# Patient Record
Sex: Male | Born: 1960 | Race: Black or African American | Hispanic: No | Marital: Single | Smoking: Former smoker
Health system: Southern US, Community
[De-identification: ages and names within clinical notes are randomized; demographics above are authoritative.]

## PROBLEM LIST (undated history)

## (undated) DIAGNOSIS — Z86718 Personal history of other venous thrombosis and embolism: Secondary | ICD-10-CM

## (undated) DIAGNOSIS — Z8719 Personal history of other diseases of the digestive system: Secondary | ICD-10-CM

## (undated) DIAGNOSIS — I739 Peripheral vascular disease, unspecified: Secondary | ICD-10-CM

## (undated) DIAGNOSIS — K219 Gastro-esophageal reflux disease without esophagitis: Secondary | ICD-10-CM

## (undated) DIAGNOSIS — I5032 Chronic diastolic (congestive) heart failure: Secondary | ICD-10-CM

## (undated) DIAGNOSIS — B1920 Unspecified viral hepatitis C without hepatic coma: Secondary | ICD-10-CM

## (undated) DIAGNOSIS — C61 Malignant neoplasm of prostate: Secondary | ICD-10-CM

## (undated) DIAGNOSIS — I2510 Atherosclerotic heart disease of native coronary artery without angina pectoris: Secondary | ICD-10-CM

## (undated) DIAGNOSIS — I5022 Chronic systolic (congestive) heart failure: Secondary | ICD-10-CM

## (undated) DIAGNOSIS — IMO0002 Reserved for concepts with insufficient information to code with codable children: Secondary | ICD-10-CM

## (undated) DIAGNOSIS — Z940 Kidney transplant status: Secondary | ICD-10-CM

## (undated) DIAGNOSIS — M1990 Unspecified osteoarthritis, unspecified site: Secondary | ICD-10-CM

## (undated) DIAGNOSIS — N189 Chronic kidney disease, unspecified: Secondary | ICD-10-CM

## (undated) DIAGNOSIS — E46 Unspecified protein-calorie malnutrition: Secondary | ICD-10-CM

## (undated) DIAGNOSIS — Z992 Dependence on renal dialysis: Secondary | ICD-10-CM

## (undated) DIAGNOSIS — I96 Gangrene, not elsewhere classified: Secondary | ICD-10-CM

## (undated) DIAGNOSIS — N186 End stage renal disease: Secondary | ICD-10-CM

## (undated) DIAGNOSIS — M1A9XX1 Chronic gout, unspecified, with tophus (tophi): Secondary | ICD-10-CM

## (undated) DIAGNOSIS — I10 Essential (primary) hypertension: Secondary | ICD-10-CM

## (undated) DIAGNOSIS — D631 Anemia in chronic kidney disease: Secondary | ICD-10-CM

## (undated) HISTORY — PX: OTHER SURGICAL HISTORY: SHX169

## (undated) HISTORY — PX: EYE SURGERY: SHX253

## (undated) HISTORY — PX: COLONOSCOPY: SHX174

## (undated) HISTORY — DX: Peripheral vascular disease, unspecified: I73.9

---

## 1998-01-03 ENCOUNTER — Ambulatory Visit (HOSPITAL_COMMUNITY): Admission: RE | Admit: 1998-01-03 | Discharge: 1998-01-03 | Payer: Self-pay | Admitting: Nephrology

## 1998-01-17 ENCOUNTER — Ambulatory Visit (HOSPITAL_COMMUNITY): Admission: RE | Admit: 1998-01-17 | Discharge: 1998-01-17 | Payer: Self-pay | Admitting: Nephrology

## 1998-01-30 ENCOUNTER — Ambulatory Visit (HOSPITAL_COMMUNITY): Admission: RE | Admit: 1998-01-30 | Discharge: 1998-01-30 | Payer: Self-pay | Admitting: Nephrology

## 2000-05-02 ENCOUNTER — Emergency Department (HOSPITAL_COMMUNITY): Admission: EM | Admit: 2000-05-02 | Discharge: 2000-05-02 | Payer: Self-pay | Admitting: Emergency Medicine

## 2000-08-25 ENCOUNTER — Encounter (HOSPITAL_COMMUNITY): Admission: RE | Admit: 2000-08-25 | Discharge: 2000-11-23 | Payer: Self-pay | Admitting: Nephrology

## 2000-09-06 ENCOUNTER — Encounter (INDEPENDENT_AMBULATORY_CARE_PROVIDER_SITE_OTHER): Payer: Self-pay | Admitting: *Deleted

## 2000-09-06 ENCOUNTER — Inpatient Hospital Stay (HOSPITAL_COMMUNITY): Admission: EM | Admit: 2000-09-06 | Discharge: 2000-09-14 | Payer: Self-pay | Admitting: Emergency Medicine

## 2000-09-08 ENCOUNTER — Encounter: Payer: Self-pay | Admitting: *Deleted

## 2000-10-06 ENCOUNTER — Encounter (INDEPENDENT_AMBULATORY_CARE_PROVIDER_SITE_OTHER): Payer: Self-pay | Admitting: *Deleted

## 2000-10-06 ENCOUNTER — Ambulatory Visit (HOSPITAL_COMMUNITY): Admission: RE | Admit: 2000-10-06 | Discharge: 2000-10-06 | Payer: Self-pay | Admitting: Family Medicine

## 2001-01-05 ENCOUNTER — Encounter (HOSPITAL_COMMUNITY): Admission: RE | Admit: 2001-01-05 | Discharge: 2001-04-05 | Payer: Self-pay | Admitting: Nephrology

## 2001-01-25 ENCOUNTER — Ambulatory Visit (HOSPITAL_COMMUNITY): Admission: RE | Admit: 2001-01-25 | Discharge: 2001-01-25 | Payer: Self-pay | Admitting: *Deleted

## 2001-01-25 ENCOUNTER — Encounter (INDEPENDENT_AMBULATORY_CARE_PROVIDER_SITE_OTHER): Payer: Self-pay | Admitting: Specialist

## 2001-02-04 ENCOUNTER — Encounter: Payer: Self-pay | Admitting: Nephrology

## 2001-02-04 ENCOUNTER — Encounter: Admission: RE | Admit: 2001-02-04 | Discharge: 2001-02-04 | Payer: Self-pay | Admitting: Nephrology

## 2001-02-09 ENCOUNTER — Ambulatory Visit (HOSPITAL_BASED_OUTPATIENT_CLINIC_OR_DEPARTMENT_OTHER): Admission: RE | Admit: 2001-02-09 | Discharge: 2001-02-09 | Payer: Self-pay | Admitting: Orthopedic Surgery

## 2001-04-13 ENCOUNTER — Encounter (HOSPITAL_COMMUNITY): Admission: RE | Admit: 2001-04-13 | Discharge: 2001-07-12 | Payer: Self-pay | Admitting: Nephrology

## 2001-09-20 ENCOUNTER — Encounter (HOSPITAL_COMMUNITY): Admission: RE | Admit: 2001-09-20 | Discharge: 2001-12-19 | Payer: Self-pay | Admitting: Nephrology

## 2002-01-18 ENCOUNTER — Encounter: Payer: Self-pay | Admitting: General Surgery

## 2002-01-20 ENCOUNTER — Encounter (INDEPENDENT_AMBULATORY_CARE_PROVIDER_SITE_OTHER): Payer: Self-pay | Admitting: *Deleted

## 2002-01-20 ENCOUNTER — Ambulatory Visit (HOSPITAL_COMMUNITY): Admission: RE | Admit: 2002-01-20 | Discharge: 2002-01-21 | Payer: Self-pay | Admitting: General Surgery

## 2002-03-14 ENCOUNTER — Encounter (HOSPITAL_COMMUNITY): Admission: RE | Admit: 2002-03-14 | Discharge: 2002-06-12 | Payer: Self-pay | Admitting: Nephrology

## 2002-06-13 ENCOUNTER — Encounter (HOSPITAL_COMMUNITY): Admission: RE | Admit: 2002-06-13 | Discharge: 2002-09-11 | Payer: Self-pay | Admitting: Nephrology

## 2002-09-19 ENCOUNTER — Encounter (HOSPITAL_COMMUNITY): Admission: RE | Admit: 2002-09-19 | Discharge: 2002-12-18 | Payer: Self-pay | Admitting: Nephrology

## 2003-01-09 ENCOUNTER — Encounter (HOSPITAL_COMMUNITY): Admission: RE | Admit: 2003-01-09 | Discharge: 2003-04-09 | Payer: Self-pay | Admitting: Nephrology

## 2003-01-27 ENCOUNTER — Ambulatory Visit (HOSPITAL_COMMUNITY): Admission: RE | Admit: 2003-01-27 | Discharge: 2003-01-27 | Payer: Self-pay | Admitting: Orthopedic Surgery

## 2003-01-27 ENCOUNTER — Encounter: Payer: Self-pay | Admitting: Orthopedic Surgery

## 2003-04-20 ENCOUNTER — Encounter: Admission: RE | Admit: 2003-04-20 | Discharge: 2003-07-19 | Payer: Self-pay | Admitting: Rheumatology

## 2003-04-24 ENCOUNTER — Encounter (HOSPITAL_COMMUNITY): Admission: RE | Admit: 2003-04-24 | Discharge: 2003-07-23 | Payer: Self-pay | Admitting: Nephrology

## 2003-04-26 ENCOUNTER — Encounter: Payer: Self-pay | Admitting: Vascular Surgery

## 2003-04-26 ENCOUNTER — Ambulatory Visit (HOSPITAL_COMMUNITY): Admission: RE | Admit: 2003-04-26 | Discharge: 2003-04-26 | Payer: Self-pay | Admitting: Vascular Surgery

## 2003-05-06 ENCOUNTER — Encounter (INDEPENDENT_AMBULATORY_CARE_PROVIDER_SITE_OTHER): Payer: Self-pay | Admitting: *Deleted

## 2003-05-06 ENCOUNTER — Encounter: Payer: Self-pay | Admitting: Nephrology

## 2003-05-06 ENCOUNTER — Inpatient Hospital Stay (HOSPITAL_COMMUNITY): Admission: EM | Admit: 2003-05-06 | Discharge: 2003-05-19 | Payer: Self-pay | Admitting: Emergency Medicine

## 2003-05-06 ENCOUNTER — Encounter: Payer: Self-pay | Admitting: Emergency Medicine

## 2003-05-08 ENCOUNTER — Encounter: Payer: Self-pay | Admitting: Nephrology

## 2003-05-10 ENCOUNTER — Encounter: Payer: Self-pay | Admitting: Nephrology

## 2003-05-13 ENCOUNTER — Encounter: Payer: Self-pay | Admitting: Nephrology

## 2003-05-14 ENCOUNTER — Encounter: Payer: Self-pay | Admitting: Nephrology

## 2003-05-15 ENCOUNTER — Encounter: Payer: Self-pay | Admitting: Nephrology

## 2003-05-17 ENCOUNTER — Encounter: Payer: Self-pay | Admitting: General Surgery

## 2003-09-27 ENCOUNTER — Encounter: Admission: RE | Admit: 2003-09-27 | Discharge: 2003-09-27 | Payer: Self-pay | Admitting: General Surgery

## 2003-09-29 ENCOUNTER — Ambulatory Visit (HOSPITAL_COMMUNITY): Admission: RE | Admit: 2003-09-29 | Discharge: 2003-09-29 | Payer: Self-pay | Admitting: Nephrology

## 2003-11-06 ENCOUNTER — Encounter (INDEPENDENT_AMBULATORY_CARE_PROVIDER_SITE_OTHER): Payer: Self-pay | Admitting: Specialist

## 2003-11-06 ENCOUNTER — Inpatient Hospital Stay (HOSPITAL_COMMUNITY): Admission: RE | Admit: 2003-11-06 | Discharge: 2003-11-12 | Payer: Self-pay | Admitting: General Surgery

## 2003-11-28 ENCOUNTER — Ambulatory Visit (HOSPITAL_BASED_OUTPATIENT_CLINIC_OR_DEPARTMENT_OTHER): Admission: RE | Admit: 2003-11-28 | Discharge: 2003-11-28 | Payer: Self-pay | Admitting: Orthopedic Surgery

## 2003-11-28 ENCOUNTER — Ambulatory Visit (HOSPITAL_COMMUNITY): Admission: RE | Admit: 2003-11-28 | Discharge: 2003-11-28 | Payer: Self-pay | Admitting: Orthopedic Surgery

## 2003-11-28 ENCOUNTER — Encounter (INDEPENDENT_AMBULATORY_CARE_PROVIDER_SITE_OTHER): Payer: Self-pay | Admitting: *Deleted

## 2003-12-13 ENCOUNTER — Emergency Department (HOSPITAL_COMMUNITY): Admission: EM | Admit: 2003-12-13 | Discharge: 2003-12-13 | Payer: Self-pay | Admitting: Family Medicine

## 2003-12-27 ENCOUNTER — Encounter: Admission: RE | Admit: 2003-12-27 | Discharge: 2003-12-27 | Payer: Self-pay | Admitting: Nephrology

## 2004-01-06 ENCOUNTER — Inpatient Hospital Stay (HOSPITAL_COMMUNITY): Admission: EM | Admit: 2004-01-06 | Discharge: 2004-01-17 | Payer: Self-pay | Admitting: Emergency Medicine

## 2004-01-23 ENCOUNTER — Ambulatory Visit (HOSPITAL_COMMUNITY): Admission: RE | Admit: 2004-01-23 | Discharge: 2004-01-23 | Payer: Self-pay | Admitting: Nephrology

## 2004-01-31 ENCOUNTER — Encounter: Admission: RE | Admit: 2004-01-31 | Discharge: 2004-01-31 | Payer: Self-pay | Admitting: Nephrology

## 2004-09-04 ENCOUNTER — Encounter (INDEPENDENT_AMBULATORY_CARE_PROVIDER_SITE_OTHER): Payer: Self-pay | Admitting: Specialist

## 2004-09-04 ENCOUNTER — Ambulatory Visit (HOSPITAL_COMMUNITY): Admission: RE | Admit: 2004-09-04 | Discharge: 2004-09-04 | Payer: Self-pay | Admitting: Surgery

## 2004-10-31 ENCOUNTER — Emergency Department (HOSPITAL_COMMUNITY): Admission: EM | Admit: 2004-10-31 | Discharge: 2004-10-31 | Payer: Self-pay | Admitting: *Deleted

## 2004-11-01 ENCOUNTER — Inpatient Hospital Stay (HOSPITAL_COMMUNITY): Admission: EM | Admit: 2004-11-01 | Discharge: 2004-11-06 | Payer: Self-pay | Admitting: Emergency Medicine

## 2004-11-21 ENCOUNTER — Ambulatory Visit (HOSPITAL_COMMUNITY): Admission: RE | Admit: 2004-11-21 | Discharge: 2004-11-22 | Payer: Self-pay | Admitting: *Deleted

## 2005-03-15 ENCOUNTER — Emergency Department (HOSPITAL_COMMUNITY): Admission: EM | Admit: 2005-03-15 | Discharge: 2005-03-15 | Payer: Self-pay | Admitting: Emergency Medicine

## 2005-03-26 ENCOUNTER — Ambulatory Visit: Payer: Self-pay | Admitting: Orthopedic Surgery

## 2005-04-23 ENCOUNTER — Ambulatory Visit: Payer: Self-pay | Admitting: Orthopedic Surgery

## 2006-03-27 ENCOUNTER — Encounter: Admission: RE | Admit: 2006-03-27 | Discharge: 2006-03-27 | Payer: Self-pay | Admitting: Nephrology

## 2006-07-17 ENCOUNTER — Ambulatory Visit (HOSPITAL_COMMUNITY): Admission: RE | Admit: 2006-07-17 | Discharge: 2006-07-17 | Payer: Self-pay | Admitting: Nephrology

## 2007-01-13 ENCOUNTER — Ambulatory Visit (HOSPITAL_COMMUNITY): Admission: RE | Admit: 2007-01-13 | Discharge: 2007-01-13 | Payer: Self-pay | Admitting: Nephrology

## 2007-07-05 ENCOUNTER — Ambulatory Visit (HOSPITAL_COMMUNITY): Admission: RE | Admit: 2007-07-05 | Discharge: 2007-07-05 | Payer: Self-pay | Admitting: Nephrology

## 2007-11-04 ENCOUNTER — Emergency Department (HOSPITAL_COMMUNITY): Admission: EM | Admit: 2007-11-04 | Discharge: 2007-11-04 | Payer: Self-pay | Admitting: Emergency Medicine

## 2008-11-08 ENCOUNTER — Ambulatory Visit (HOSPITAL_COMMUNITY): Admission: RE | Admit: 2008-11-08 | Discharge: 2008-11-08 | Payer: Self-pay | Admitting: Nephrology

## 2009-03-10 ENCOUNTER — Ambulatory Visit (HOSPITAL_COMMUNITY): Admission: RE | Admit: 2009-03-10 | Discharge: 2009-03-10 | Payer: Self-pay | Admitting: Vascular Surgery

## 2009-03-10 ENCOUNTER — Ambulatory Visit: Payer: Self-pay | Admitting: Vascular Surgery

## 2009-03-12 ENCOUNTER — Ambulatory Visit: Payer: Self-pay | Admitting: Surgery

## 2009-03-23 ENCOUNTER — Ambulatory Visit (HOSPITAL_COMMUNITY): Admission: RE | Admit: 2009-03-23 | Discharge: 2009-03-23 | Payer: Self-pay | Admitting: Surgery

## 2009-04-16 ENCOUNTER — Ambulatory Visit: Payer: Self-pay | Admitting: Surgery

## 2009-04-27 ENCOUNTER — Ambulatory Visit (HOSPITAL_COMMUNITY): Admission: RE | Admit: 2009-04-27 | Discharge: 2009-04-27 | Payer: Self-pay | Admitting: Vascular Surgery

## 2009-08-25 ENCOUNTER — Ambulatory Visit (HOSPITAL_COMMUNITY): Admission: RE | Admit: 2009-08-25 | Discharge: 2009-08-25 | Payer: Self-pay | Admitting: Nephrology

## 2009-09-22 HISTORY — PX: OTHER SURGICAL HISTORY: SHX169

## 2009-12-12 ENCOUNTER — Ambulatory Visit (HOSPITAL_COMMUNITY): Admission: RE | Admit: 2009-12-12 | Discharge: 2009-12-12 | Payer: Self-pay | Admitting: Nephrology

## 2010-02-25 ENCOUNTER — Encounter (INDEPENDENT_AMBULATORY_CARE_PROVIDER_SITE_OTHER): Payer: Self-pay | Admitting: *Deleted

## 2010-02-27 ENCOUNTER — Ambulatory Visit: Payer: Self-pay | Admitting: Gastroenterology

## 2010-03-10 ENCOUNTER — Emergency Department (HOSPITAL_COMMUNITY): Admission: EM | Admit: 2010-03-10 | Discharge: 2010-03-10 | Payer: Self-pay | Admitting: Emergency Medicine

## 2010-03-12 ENCOUNTER — Ambulatory Visit (HOSPITAL_COMMUNITY): Admission: RE | Admit: 2010-03-12 | Discharge: 2010-03-12 | Payer: Self-pay | Admitting: Nephrology

## 2010-03-13 ENCOUNTER — Ambulatory Visit: Payer: Self-pay | Admitting: Gastroenterology

## 2010-06-04 ENCOUNTER — Ambulatory Visit (HOSPITAL_COMMUNITY): Admission: RE | Admit: 2010-06-04 | Discharge: 2010-06-04 | Payer: Self-pay | Admitting: Nephrology

## 2010-08-02 ENCOUNTER — Ambulatory Visit (HOSPITAL_COMMUNITY): Admission: RE | Admit: 2010-08-02 | Discharge: 2010-08-02 | Payer: Self-pay | Admitting: Nephrology

## 2010-08-16 ENCOUNTER — Encounter: Admission: RE | Admit: 2010-08-16 | Discharge: 2010-08-16 | Payer: Self-pay | Admitting: Nephrology

## 2010-09-28 ENCOUNTER — Ambulatory Visit (HOSPITAL_COMMUNITY)
Admission: RE | Admit: 2010-09-28 | Discharge: 2010-09-28 | Payer: Self-pay | Source: Home / Self Care | Attending: Nephrology | Admitting: Nephrology

## 2010-09-30 ENCOUNTER — Ambulatory Visit (HOSPITAL_COMMUNITY)
Admission: RE | Admit: 2010-09-30 | Discharge: 2010-09-30 | Payer: Self-pay | Source: Home / Self Care | Attending: Vascular Surgery | Admitting: Vascular Surgery

## 2010-10-07 LAB — POCT I-STAT 4, (NA,K, GLUC, HGB,HCT)
Glucose, Bld: 79 mg/dL (ref 70–99)
HCT: 40 % (ref 39.0–52.0)
Hemoglobin: 13.6 g/dL (ref 13.0–17.0)
Potassium: 5.9 mEq/L — ABNORMAL HIGH (ref 3.5–5.1)
Sodium: 139 mEq/L (ref 135–145)

## 2010-10-07 LAB — POTASSIUM: Potassium: 4.9 mEq/L (ref 3.5–5.1)

## 2010-10-07 LAB — SURGICAL PCR SCREEN
MRSA, PCR: NEGATIVE
Staphylococcus aureus: NEGATIVE

## 2010-10-13 ENCOUNTER — Encounter: Payer: Self-pay | Admitting: Diagnostic Radiology

## 2010-10-22 NOTE — Letter (Signed)
Summary: Moviprep Instructions  [facility]  [address]   [place], [place] [address]   Phone: [phone]  Fax: [phone]       [name]    12-11-1960    MRN: [id number]        Procedure Day Dorna Bloom: Wednesday, 03-13-10     Arrival Time: 12:30 p.m.     Procedure Time: 1:30 p.m.     Location of Procedure:                    x   [facility] (4th Floor)                        PREPARATION FOR COLONOSCOPY WITH MOVIPREP   Starting 5 days prior to your procedure  03-08-10 do not eat nuts, seeds, popcorn, corn, beans, peas,  salads, or any raw vegetables.  Do not take any fiber supplements (e.g. Metamucil, Citrucel, and Benefiber).  THE DAY BEFORE YOUR PROCEDURE         DATE:  03-12-10  DAY: Tuesday  1.  Drink clear liquids the entire day-NO SOLID FOOD  2.  Do not drink anything colored red or purple.  Avoid juices with pulp.  No orange juice.  3.  Drink at least 64 oz. (8 glasses) of fluid/clear liquids during the day to prevent dehydration and help the prep work efficiently.  CLEAR LIQUIDS INCLUDE: Water Jello Ice Popsicles Tea (sugar ok, no milk/cream) Powdered fruit flavored drinks Coffee (sugar ok, no milk/cream) Gatorade Juice: apple, white grape, white cranberry  Lemonade Clear bullion, consomm, broth Carbonated beverages (any kind) Strained chicken noodle soup Hard Candy                             4.  In the morning, mix first dose of MoviPrep solution:    Empty 1 Pouch A and 1 Pouch B into the disposable container    Add lukewarm drinking water to the top line of the container. Mix to dissolve    Refrigerate (mixed solution should be used within 24 hrs)  5.  Begin drinking the prep at 5:00 p.m. The MoviPrep container is divided by 4 marks.   Every 15 minutes drink the solution down to the next mark (approximately 8 oz) until the full liter is complete.   6.  Follow completed prep with 16 oz of clear liquid of your choice  (Nothing red or purple).  Continue to drink clear liquids until bedtime.  7.  Before going to bed, mix second dose of MoviPrep solution:    Empty 1 Pouch A and 1 Pouch B into the disposable container    Add lukewarm drinking water to the top line of the container. Mix to dissolve    Refrigerate  THE DAY OF YOUR PROCEDURE      DATE: 03-13-10  DAY: Wednesday  Beginning at 8:30 a.m. (5 hours before procedure):         1. Every 15 minutes, drink the solution down to the next mark (approx 8 oz) until the full liter is complete.  2. Follow completed prep with 16 oz. of clear liquid of your choice.    3. You may drink clear liquids until  11:30 a.m.  (2 HOURS BEFORE PROCEDURE).   MEDICATION INSTRUCTIONS  Unless otherwise instructed, you should take regular prescription medications with a small sip of water   as early  as possible the morning of your procedure.           OTHER INSTRUCTIONS  You will need a responsible adult at least 52 years of age to accompany you and drive you home.   This person must remain in the waiting room during your procedure.  Wear loose fitting clothing that is easily removed.  Leave jewelry and other valuables at home.  However, you may wish to bring a book to read or  an iPod/MP3 player to listen to music as you wait for your procedure to start.  Remove all body piercing jewelry and leave at home.  Total time from sign-in until discharge is approximately 2-3 hours.  You should go home directly after your procedure and rest.  You can resume normal activities the  day after your procedure.  The day of your procedure you should not:   Drive   Make legal decisions   Operate machinery   Drink alcohol   Return to work  You will receive specific instructions about eating, activities and medications before you leave.    The above instructions have been reviewed and explained to me by Ezra Sites RN  February 27, 2010 11:34 AM       I fully  understand and can verbalize these instructions _____________________________ Date _________

## 2010-10-22 NOTE — Procedures (Signed)
Summary: Colonoscopy  Patient: [name] Note: All result statuses are Final unless otherwise noted.  Tests: (1) Colonoscopy (COL)   COL Colonoscopy           DONE     [facility]     [address]     [place], [place]  [address]           COLONOSCOPY PROCEDURE REPORT           PATIENT:  [name], [name]  MR#:  [id number]     BIRTHDATE:  13-May-1961, 48 yrs. old  GENDER:  male     ENDOSCOPIST:  Judie Petit T. Russella Dar, MD, [facility]     Referred by:  Primitivo Gauze, M.D.     PROCEDURE DATE:  03/13/2010     PROCEDURE:  Average-risk screening colonoscopy G0121     ASA CLASS:  Class III     INDICATIONS:  1) Routine Risk Screening     MEDICATIONS:   Fentanyl 100 mcg IV, Versed 10 mg IV     DESCRIPTION OF PROCEDURE:   After the risks benefits and     alternatives of the procedure were thoroughly explained, informed     consent was obtained.  Digital rectal exam was performed and     revealed no abnormalities.   The LB PCF-Q180AL [id number] endoscope     was introduced through the anus and advanced to the cecum, which     was identified by both the appendix and ileocecal valve, without     limitations.  The quality of the prep was excellent, using     MoviPrep.  The instrument was then slowly withdrawn as the colon     was fully examined.     <<PROCEDUREIMAGES>>     FINDINGS:  Moderate diverticulosis was found ascending colon to     sigmoid colon. This was otherwise a normal examination of the     colon.   Retroflexed views in the rectum revealed no     abnormalities.  The time to cecum =  2.25  minutes. The scope was     then withdrawn (time =  8.67  min) from the patient and the     procedure completed.     COMPLICATIONS:  None           ENDOSCOPIC IMPRESSION:     1) Moderate diverticulosis ascending colon to sigmoid colon     RECOMMENDATIONS:     1) High fiber diet.     2) Continue current colorectal screening recommendations for     "routine risk" patients with a repeat  colonoscopy in 10 years.           Venita Lick. Russella Dar, MD, [facility]           n.     eSIGNED:   Venita Lick. Kelisha Dall at 03/13/2010 02:02 PM           Duard Larsen, [id number]  Note: An exclamation mark (!) indicates a result that was not dispersed into the flowsheet. Document Creation Date: 03/13/2010 2:02 PM _______________________________________________________________________  (1) Order result status: Final Collection or observation date-time: 03/13/2010 13:57 Requested date-time:  Receipt date-time:  Reported date-time:  Referring Physician:   Ordering Physician: Claudette Head [phone]) Specimen Source:  Source: Launa Grill Order Number: [id number] Lab site:   Appended Document: Colonoscopy     Procedures Next Due Date:    Colonoscopy: 03/2020

## 2010-10-22 NOTE — Miscellaneous (Signed)
Summary: [facility]  Clinical Lists Changes  Medications: Added new medication of MOVIPREP 100 GM  SOLR (PEG-KCL-NACL-NASULF-NA ASC-C) As per prep instructions. - Signed Rx of MOVIPREP 100 GM  SOLR (PEG-KCL-NACL-NASULF-NA ASC-C) As per prep instructions.;  #1 x 0;  Signed;  Entered by: Ezra Sites RN;  Authorized by: Meryl Dare MD [facility];  Method used: Electronically to [facility] [address], [place], [place], [place]  [address], Ph: [phone] or [phone], Fax: [phone] Allergies: Added new allergy or adverse reaction of ALLOPURINOL Added new allergy or adverse reaction of ASA Observations: Added new observation of NKA: F (02/27/2010 11:04)    Prescriptions: MOVIPREP 100 GM  SOLR (PEG-KCL-NACL-NASULF-NA ASC-C) As per prep instructions.  #1 x 0   Entered by:   Ezra Sites RN   Authorized by:   Meryl Dare MD [facility]   Signed by:   Ezra Sites RN on 02/27/2010   Method used:   Electronically to        [facility]* (retail)       [address]       [place], [place]  [address]       Ph: [phone] or [phone]       Fax: [phone]   RxID:   [id number]

## 2010-11-08 ENCOUNTER — Ambulatory Visit
Admission: RE | Admit: 2010-11-08 | Discharge: 2010-11-08 | Disposition: A | Discharge status: Home / Self Care | Payer: Medicare Other | Source: Ambulatory Visit | Attending: Nephrology | Admitting: Nephrology

## 2010-11-08 ENCOUNTER — Other Ambulatory Visit: Payer: Self-pay | Admitting: Nephrology

## 2010-11-08 DIAGNOSIS — M25531 Pain in right wrist: Secondary | ICD-10-CM

## 2010-12-03 LAB — POTASSIUM: Potassium: 4.3 mEq/L (ref 3.5–5.1)

## 2010-12-08 LAB — POTASSIUM: Potassium: 5.6 mEq/L — ABNORMAL HIGH (ref 3.5–5.1)

## 2010-12-16 LAB — POTASSIUM: Potassium: 4.5 mEq/L (ref 3.5–5.1)

## 2010-12-29 LAB — POCT I-STAT 4, (NA,K, GLUC, HGB,HCT): HCT: 45 % (ref 39.0–52.0)

## 2010-12-30 LAB — POCT I-STAT 4, (NA,K, GLUC, HGB,HCT)
Glucose, Bld: 82 mg/dL (ref 70–99)
Hemoglobin: 15.6 g/dL (ref 13.0–17.0)
Potassium: 4.3 mEq/L (ref 3.5–5.1)

## 2011-02-04 NOTE — Assessment & Plan Note (Signed)
OFFICE VISIT   JAHEIM, CANINO  DOB:  02-17-1961                                       03/12/2009  [id number]#:[id number]   REASON FOR VISIT:  Dialysis access.   HISTORY:  The patient is a 50 year old gentleman with end-stage renal  disease who has previously been dialyzing through a left upper arm  fistula.  This is no longer usable.  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  He recently had a Palindrome catheter placed.  He  comes in for new access.  Vein mapping was performed today which shows  him to have an occluded cephalic vein above the elbow.  His basilic vein  is too short for basilic vein transposition.  He, therefore, is a  candidate for a right forearm AV Gore-Tex graft.  The patient has a  prominent aspect on his left fistula and so I think he also needs to  undergo ligation of the fistula.  I am scheduling him for a right  forearm AV Gore-Tex graft and ligation of his left upper arm fistula to  be performed on Friday, July 2nd.   Jorge Ny, MD  Electronically Signed   VWB/MEDQ  D:  03/12/2009  T:  03/13/2009  Job:  [id number]   cc:   [facility]

## 2011-02-04 NOTE — Assessment & Plan Note (Signed)
OFFICE VISIT   ALEX, LEAHY  DOB:  04-01-1961                                       04/16/2009  [id number]#:[id number]   REASON FOR VISIT:  Follow up access.   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  He has  recently had a right-sided Palindrome catheter and came in for new  access.  Vein mapping was performed which showed him to have an occluded  cephalic vein above the elbow on the right and a basilic vein that was  too short for transposition.  He was, therefore, only a candidate for a  right-sided graft.  He also still had some pulsatility his left arm  fistula and therefore he underwent right forearm AV Gore-Tex graft and  ligation of his left arm fistula.  He comes back in today for follow-up.   There is good thrill within his graft..  The pulsatility to his proximal  fistula is gone.  His incisions are all healing nicely.  I will not  schedule him to come back to see me.  He will start using his graft in 1  week.   Jorge Ny, MD  Electronically Signed   VWB/MEDQ  D:  04/16/2009  T:  04/18/2009  Job:  [id number]   cc:   [employer]

## 2011-02-04 NOTE — Op Note (Signed)
[name], STROHECKER NO.:  [id number]   MEDICAL RECORD NO.:  [id number]          PATIENT TYPE:  AMB   LOCATION:  SDS                          FACILITY:  [facility]   PHYSICIAN:  Larina Earthly, M.D.    DATE OF BIRTH:  [birth year]   DATE OF PROCEDURE:  03/10/2009  DATE OF DISCHARGE:  03/10/2009                               OPERATIVE REPORT   PREOPERATIVE DIAGNOSIS:  End-stage renal disease with occluded left  upper arm arteriovenous fistula.   POSTOPERATIVE DIAGNOSIS:  End-stage renal disease with occluded left  upper arm arteriovenous fistula.   PROCEDURE:  Placement of right internal jugular Palindrome catheter with  ultrasound visualization.   SURGEON:  Larina Earthly, M.D.   ASSISTANT:  Nurse.   ANESTHESIA:  MAC.   COMPLICATIONS:  None.   DISPOSITION:  Recovery room, stable.   INDICATIONS FOR PROCEDURE:  The patient is a 50 year old gentleman with  long history of end-stage renal disease.  He has been dialyzed via left  upper arm AV fistula since 2004.  He had a known left innominate  occlusion.  He had also had marked aneurysm of degeneration and actually  thrombosed the upper segment of his fistula a month ago.  He was  scheduled to see [place] in the office on March 12, 2009, for consideration of  placement of right arm access.  He presented to dialysis on March 10, 2009, with an occluded fistula.  I discussed the options with Mr.  Shorey, explained that I do not feel that there was any possibility of  salvaging his left arm fistula.  We recommended that we place a  catheter today and then follow up with vein mapping and probable right  arm access.   PROCEDURE IN DETAIL:  The patient was taken to the operating room and  placed in supine position.  The right neck was imaged with ultrasound  revealing a patent jugular vein.  The patient's right and left neck and  chest was prepped and draped in the usual sterile fashion.  Using local  anesthesia and a finer  needle, with the patient in Trendelenburg  position the internal jugular vein was accessed.  Next, using Seldinger  technique, a guidewire was passed down to the level of right atrium,  this was confirmed with fluoroscopy.  A separate incision was made at  the level of clavicle and the 23-cm  Palindrome catheter was tunneled  through the subcutaneous tissue to the level of the guidewire entry  site.  Sequential dilators were passed over the guidewire and the  catheter was passed over the guidewire in a weave technique.  Catheter  was positioned at the level of the mid right atrium.  The internal  stylets were removed from the catheter.  Both lumens were flushed and  aspirated easily and were locked with 1000 units  per mL heparin.  The catheter was secured to the skin with 3-0 nylon  stitch and the entry site was closed with 4-0 subcuticular Vicryl  stitch.  Sterile dressing was applied.  The patient was taken to the  recovery room in stable condition.      Larina Earthly, M.D.  Electronically Signed     TFE/MEDQ  D:  03/10/2009  T:  03/11/2009  Job:  [id number]

## 2011-02-04 NOTE — Procedures (Signed)
CEPHALIC VEIN MAPPING   INDICATION:  End-stage renal disease.   HISTORY:  End-stage renal disease.   EXAM:   The right cephalic vein is evaluated.   Diameter measurements range from 0.31 to 0.37, low arm to [facility] fossa.   The left cephalic vein is not evaluated.   See attached worksheet for all measurements.   IMPRESSION:  Patent right cephalic vein which not of acceptable diameter  for use as a dialysis access site.   ___________________________________________  V. Charlena Cross, MD   MG/MEDQ  D:  03/12/2009  T:  03/12/2009  Job:  [id number]

## 2011-02-04 NOTE — Op Note (Signed)
[name], [name]                ACCOUNT NO.:  [id number]   MEDICAL RECORD NO.:  [id number]          PATIENT TYPE:  AMB   LOCATION:  SDS                          FACILITY:  [facility]   PHYSICIAN:  VDurene Cal IV, MDDATE OF BIRTH:  Feb 15, 1961   DATE OF PROCEDURE:  DATE OF DISCHARGE:                               OPERATIVE REPORT   PREOPERATIVE DIAGNOSIS:  End-stage renal disease.   POSTOPERATIVE DIAGNOSIS:  End-stage renal disease.   PROCEDURE PERFORMED:  1. Insertion of a right forearm AV Gore-Tex graft.  2. Ligation of left upper arm arteriovenous fistula.   SURGEON:  1. Durene Cal IV, MD   ANESTHESIA:  MAC.   BLOOD LOSS:  Minimal.   FINDINGS:  On the right forearm Gore-Tex graft, the vein is lateral, end-  to-end anastomosis was performed to the brachial vein.   PROCEDURE:  The patient was identified in the holding area and taken to  room 8.  He was placed supine on the table.  MAC anesthesia was  administered.  The patient was prepped and draped in standard sterile  fashion.  A time-out was called, antibiotics were given.  Lidocaine 1%  was used for local anesthesia.  A transverse incision was made at the  antecubital crease.  Cautery was used to divide the subcutaneous tissue.  Fascia was opened sharply.  The brachial artery was readily identified  and mobilized circumferentially and encircled with a vessel loop.  This  was then mobilized proximally and distally, the artery was 3 mm.  Next,  the brachial vein which was lateral to the artery was felt to be of  adequate caliber for the venous outflow tract.  This was  circumferentially dissected free and encircled with a vessel loop.  Next, a counter incision was made in the distal forearm.  Subcutaneous  tunnel was created with a straight tunneler.  A 6 x 50 Gore-Tex graft  was brought through the tunnel.  At this point, the patient was given  systemic heparinization.  After the heparin was circulated, the  brachial  artery was occluded proximally and distally with vascular clamps.  A #11  blade was used to make an arteriotomy, which was extended with Pott  scissors.  The graft was then beveled to fit the size of the  arteriotomy.  A running anastomosis was created with 6-0 Prolene.  Prior  to completion of the anastomosis, the artery was flushed in antegrade  and retrograde fashion, the anastomosis was secured.  There was  pulsatile flow through the graft.  Next, the graft was flushed with  heparinized saline and reoccluded.  The vein was marked to ensure proper  orientation.  A right angle was placed on the distal vein, which was  then transected.  The distal end was ligated with 2-0 silk tie.  Next,  the vein was dilated with sequential coronary dilators up to a #5.  The  vein was then spatulated.  The graft was cut to fit the size of the  venotomy.  An end-to-end anastomosis was created using running 6-0  Prolene.  Prior to  completion, the graft was appropriately flushed.  Clamps were then released.  The patient had a Doppler signal in the  radial and ulnar artery, which augmented with graft compression.  Next,  there was also a good thrill within the graft.  At this point of time,  the patient was given 50 mg of protamine.  The counter incision in the  distal forearm was closed with 3-0 Vicryl.  Incision in the antecubital  crease was closed with 2 layers of 3-0 Vicryl.  Dermabond was placed on  the wound.   Next, attention was turned towards the left arm fistula.  A 1% lidocaine  was used for local anesthesia.  The patient's previous incision was  opened with a #10 blade.  Sharp dissection was performed to dissect out  the arteriovenous anastomosis.  Circumferential control of the vein was  performed.  Next, vascular clamps were placed proximally and distally on  the vein, which was then divided with a #11 blade.  The arterial side  was oversewn using a 5-0 Prolene in 2 layers.  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  I elected to oversew the distal end, this was done  with running 5-0 Prolene in 2 layers.  Next, the wound was irrigated.  The tissue was closed in 2 layers of 3-0 Vicryl and Dermabond was placed  on the skin.  The patient tolerated the procedure well, and was taken to  recovery room in stable condition.  There were no complications.      Jorge Ny, MD  Electronically Signed     VWB/MEDQ  D:  03/23/2009  T:  03/24/2009  Job:  [id number]

## 2011-02-06 ENCOUNTER — Other Ambulatory Visit (HOSPITAL_COMMUNITY): Payer: Self-pay | Admitting: Nephrology

## 2011-02-06 DIAGNOSIS — I878 Other specified disorders of veins: Secondary | ICD-10-CM

## 2011-02-06 DIAGNOSIS — N186 End stage renal disease: Secondary | ICD-10-CM

## 2011-02-06 DIAGNOSIS — T829XXA Unspecified complication of cardiac and vascular prosthetic device, implant and graft, initial encounter: Secondary | ICD-10-CM

## 2011-02-07 NOTE — Op Note (Signed)
NAME:  [name], [name]                          ACCOUNT NO.:  [id number]   MEDICAL RECORD NO.:  [id number]                   PATIENT TYPE:  AMB   LOCATION:  DSC                                  FACILITY:  [facility]   PHYSICIAN:  Katy Fitch. Naaman Plummer., M.D.          DATE OF BIRTH:  03-Oct-1960   DATE OF PROCEDURE:  11/29/2003  DATE OF DISCHARGE:                                 OPERATIVE REPORT   PREOPERATIVE DIAGNOSIS:  Massive draining tophus, left elbow, due to  infiltration of monosodium urate into olecranon bursae followed by  superficial skin breakdown and chronic drainage.   POSTOPERATIVE DIAGNOSIS:  Massive draining tophus, left elbow, due to  infiltration of monosodium urate into olecranon bursae followed by  superficial skin breakdown and chronic drainage.   PROCEDURE:  1. Debridement of massive tophus left elbow with secondary skin reduction.  2. Left olecranon bursae.   SURGEON:  Katy Fitch. Sypher, M.D.   ASSISTANT:  Jonni Sanger, P.A.-C.   ANESTHESIA:  General by LMA, supervised by anesthesiologist, Dr. Gelene Mink.   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   For many years he had progressively declining renal function due to gradual  allograft rejection and has now returned to hemodialysis.   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  He also has chronic hypertension, anemia of chronic disease,  secondary hypoparathyroidism, a history of herpes zoster and history of  pancreatis and reflux esophagitis.   He has recently had a reversal of a sigmoid colectomy and during his period  of  hospitalization was noted to have a massive tophus at his left elbow with  a breakdown of the skin over the tophus.   He is referred for an upper extremity orthopedic consult with signs of low  grade cellulitis overlying the tophus.  He is brought to the operating room  at this time for irrigation and debridement of his wound followed by  debridement of as much as his tophus as possible with partial skin closure.   After informed consent, he is brought to the operating room at this time.   DESCRIPTION OF PROCEDURE:  [name] is brought to the operating room and  placed in the supine position on the operating table.  Following informed  consent, general anesthesia by LMA was induced under the supervision of Dr.  Gelene Mink.  The left arm was prepped with Betadine soap and solution and  sterilely draped.  A pneumatic tourniquet was applied to the proximal  brachium.   Following exsanguination of the limb with an Esmarch bandage, the arterial  tourniquet was inflated on the proximal brachium to 220 mmHg.   The procedure commenced with a wedge resection of skin over the apex of the  tophus with removing of the draining sinus tracts.  A mass of tophus larger  than 1-1/2 golf balls was then meticulously removed with progressive use of  a rongeur and marginal dissection of the bursae with scissors.   Care was taken to identify and protect the ulnar nerve throughout  dissection.   Approximately 30 cubic mL of tophaceous material was removed followed by  thorough irrigation with sterile water directly into the bursal region to  dissolve as much acid as possible.  Approximately 98% of the tophus was  removed with a small amount still being left in the skin in an effort to  preserve the viability of the skin flaps.   After complete excision, the wound was again thoroughly lavaged with sterile  saline and the wound margins infiltrated with 0.25% Marcaine for  postoperative analgesia.  The  wound was loosely closed with intradermal 3-0  Prolene.   The tourniquet was released with immediate capillary refill to the hand.  There was no sign of significant bleeding.  The wound was then dressed with  Xeroform, sterile gauze, sterile Webril, ABD pads and Ace wrap.   For after care, [name] will continue with his Tuesday, Thursday and  Saturday dialysis schedule.  He is given a prescription for hydrocodone and  acetaminophen for pain one p.o.q.6h. p.r.n. pain.  He will have vancomycin  provided by the renal service in his dialysis with his next four dialysis  sessions in an effort to suppress infection.  He was given 1 g of Ancef  intraoperative at this time.                                               Katy Fitch Naaman Plummer., M.D.    RVS/MEDQ  D:  11/28/2003  T:  11/29/2003  Job:  [id number]   cc:   Fayrene Fearing L. Deterding, M.D.  [address]  [place]  [place] [address]  Fax: [phone]   Terrial Rhodes, M.D.  [address]  [place]  [place] [address]  Fax: [phone]

## 2011-02-07 NOTE — Op Note (Signed)
[place]. [facility]  Patient:    [name], [name] Visit Number: [id number] MRN: [id number]          Service Type: DSU Location: [phone] Attending Physician:  Arlis Porta Dictated by:   Adolph Pollack, M.D. Proc. Date: 01/20/02 Admit Date:  01/20/2002   CC:         Dyke Maes, M.D.   Operative Report  PREOPERATIVE DIAGNOSIS:  Right inguinal hernia.  POSTOPERATIVE DIAGNOSIS:  Right inguinal hernia.  OPERATION PERFORMED:  Right inguinal hernia repair with mesh.  SURGEON:  Adolph Pollack, M.D.  ASSISTANT:  Lorne Skeens. Hoxworth, M.D.  ANESTHESIA:  General.  INDICATIONS FOR PROCEDURE:  The patient is a 50 year old male with an enlarging right inguinal hernia.  He had had a kidney transplant through a left hockey stick incision with abdominal wall weakness and hernia.  I had a concern that I would be unable to find adequate tissue to take the mesh to through an anterior approach, so we had to discuss possible laparoscopic repair of both the anterior abdominal wall hernia and of the inguinal hernia if an anterior approach was unsuccessful.  He is now brought to the operating room for repair.  DESCRIPTION OF PROCEDURE:  The right hip was marked in holding.  He was placed supine on the operating table and a general anesthetic was administered.  The lower abdomen and groin area was sterilely prepped and draped.  I began by infiltrating 0.5% Marcaine in the subcutaneous area and made an oblique right groin incision and carried this down to the external oblique aponeurosis where I infiltrated more local anesthetic, then I split the external oblique aponeurosis fibers through the external ring medially and out toward the anterior superior iliac spine laterally. Using blunt dissection, I exposed the shelving edge of the inguinal ligament and the underlying internal oblique aponeurosis.  I spared both the ilioinguinal and the  iliohypogastric nerves. I isolated the spermatic cord and noticed an indirect sac that was very adherent to the cord.  I dissected it but had to divide one of the testicular arteries as it avulsed from the sac and I ligated it.  I was eventually able to dissect the sac  free of the cord, ligate the sac and reduce back through a patulous internal ring.  I then palpated the medial aspect of the incision and the musculature and had felt it was strong enough to hold mesh so I could avoid the laparoscopic approach to fix the abdominal wall hernia which was asymptomatic.  I subsequently a piece of 3 x 6 inch mesh into the field and anchored it 1 cm medial to the pubic tubercle with 2-0 Prolene suture.  The inferior aspect of the mesh was then anchored to the shelving edge of the inguinal ligament with running 2-0 Prolene suture up to the level 1 cm lateral to the internal ring. A slit was then cut in the mesh and wrapped around a cord.  The superior aspect of the mesh was anchored to the internal oblique muscle and aponeurosis with interrupted 2-0 Vicryl sutures.  I felt that this was strong enough to hold the mesh.  I then tucked the two tails laterally under the external oblique aponeurosis which I subsequently closed over the mesh with a running 2-0 Vicryl suture.  Scarpas fascia was closed with a running 2-0 Vicryl suture.  The skin was closed with 4-0 Monocryl subcuticular stitch.  Steri-Strips and sterile dressings were  applied.  He tolerated the procedure will without any apparent complications and was taken to the recovery room in satisfactory condition. Dictated by:   Adolph Pollack, M.D. Attending Physician:  Arlis Porta DD:  01/20/02 TD:  01/21/02 Job: [id number]

## 2011-02-07 NOTE — Consult Note (Signed)
NAMEMarland Mora  DAGO, JUNGWIRTH NO.:  [id number]   MEDICAL RECORD NO.:  [id number]          PATIENT TYPE:  INP   LOCATION:  5525                         FACILITY:  [facility]   PHYSICIAN:  Garnetta Buddy, M.D.   DATE OF BIRTH:  09/09/1961   DATE OF CONSULTATION:  11/02/2004  DATE OF DISCHARGE:                                   CONSULTATION   Mr [name] is a 50 year old African-American male with multiple chronic  problems and multiple previous abdominal surgeries.  He presented to the ED  with abdominal pain, nausea and vomiting.  He was seen in ED on February 9,  with similar symptoms, but he was sent out.  He states that his symptoms  continue, diverse in nature, which made him come back to the emergency room  on November 01, 2004.  CT was done at this time, and it showed small-bowel  obstruction due to an adhesion in the right lower abdomen.  The patient was  seen by Dr. Abbey Chatters and admitted.  He was started on n.p.o. and gentle  suction, antiemetics, and pain medications.   PAST MEDICAL HISTORY:  1.  End-stage renal disease secondary to hypertension.  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  Hemodialysis started in      August 2004.  2.  History of diabetes mellitus.  3.  Anemia.  4.  Secondary hyperparathyroidism.  5.  History of perforated sigmoid diverticulum,  status post exploratory      laparotomy, sigmoid colectomy and colostomy, incidental appendectomy in      August 2004.  6.  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  7.  History of herpes zoster.  8.  History of carpal tunnel syndrome.  9.  History of hemorrhoids.  10. History of pancreatitis.  11. History of GERD.  12. Severe tophaceous gout.  13. 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  14. History of left lower lobe infiltrate.  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   ALLERGIES:  ALLOPURINOL and ASPIRIN.   MEDICATIONS:  1.  Loperamide.  2.   Nephro-Vite 1 tablet daily.  3.  methimazole 25 mg p.o. b.i.d.  4.  Renagel with meals.  5.  Indocin CR p.r.n.  6.  Protonix 40 mg daily.  7.  Prednisone 5 mg p.o. daily.  8.  Oxycodone 5 mg 1 to 2 tablets p.o. q.6h. p.r.n. pain.   FAMILY HISTORY:  His mother died secondary to carcinoma.  His father died in  his 4s secondary to MI.  His one brother has lung transplant.   SOCIAL HISTORY:  Lives in [place] with his sister.  He quit smoking 15  years ago.  He smoked one-half pack of cigarettes for eight years.  He has  history of alcohol abuse.  He denies illicit drugs and IV drugs.   PHYSICAL EXAMINATION:  VITAL SIGNS:  Temperature 96.2, pulse 87, respiratory  rate 20, blood pressure 159/92, oxygen saturation 93 on room air.  GENERAL:  No acute distress.  __________  HEENT:  Atraumatic and normocephalic.  Eyes PERRLA.  EOM intact.  LUNGS:  Clear to auscultation bilaterally  HEART:  Regular rate and rhythm, no murmur.  ABDOMEN:  Mildly distended.  No bowel sounds.  Soft, nontender.  No liver or  spleen enlargement.  EXTREMITIES:  Lower extremities:  No edema.   LABORATORY DATA AND OTHER STUDIES:  Labs from November 01, 2004: Potassium  5.2.  The pH was 7.42, PCO2 57, bicarb 37.  Sodium 133, potassium 6.5, next  5.2, chloride 103, bicarb unknown, BUN 53, creatinine 10.3, glucose 124.  CBC showed white blood cells 13.2, hemoglobin 19.8, hematocrit 59.1,  thrombocytes 165, MCV 86.2, absolute neutrophil count 84.   CT of the abdomen with contrast, and it showed high-grade partial small-  bowel obstruction, most likely due to adhesion in the right lower abdomen.  Stable atrophy of the native kidneys.  Small amount of free peritoneal  fluid, stable cystic and sclerotic changes in the lateral aspect of the  right femoral neck.   ASSESSMENT AND PLAN:  A 50 year old African-American male with end-stage  renal disease admitted with small-bowel obstruction.  The patient is  followed by  surgery.  He was placed on n.p.o.  NG tube was placed with  intermittent suction.  1.  End-stage renal disease.  He is scheduled for dialysis Tuesday,      Thursday, and Saturday.  We will start him today.  2.  Secondary hyperparathyroidism.  The patient is on Hectorol 5 mg IV with      each hemodialysis treatment.  3.  Leukocytosis, probably secondary to small-bowel obstruction.      OB/MEDQ  D:  11/02/2004  T:  11/03/2004  Job:  [id number]

## 2011-02-07 NOTE — Op Note (Signed)
   NAMEFRANKO, HILLIKER                          ACCOUNT NO.:  [id number]   MEDICAL RECORD NO.:  [id number]                   PATIENT TYPE:  OIB   LOCATION:  2899                                 FACILITY:  [facility]   PHYSICIAN:  Balinda Quails, M.D.                 DATE OF BIRTH:  1961/03/14   DATE OF PROCEDURE:  DATE OF DISCHARGE:  04/26/2003                                 OPERATIVE REPORT   PREOPERATIVE DIAGNOSIS:  Chronic renal insufficiency.   POSTOPERATIVE DIAGNOSIS:  Chronic renal insufficiency.   PROCEDURE:  Left upper arm brachiocephalic arteriovenous fistula Janeal Holmes).   SURGEON:  Balinda Quails, M.D.   ASSISTANT:  Nurse.   ANESTHESIA:  Local with MAC.   ANESTHESIOLOGIST:  Zenon Mayo, M.D.   OPERATIVE PROCEDURE:  The patient brought to the operating room in stable  condition.  Placed in the supine position.  Left arm prepped and draped in  sterile fashion.   Skin and subcutaneous tissue in the left antecubital fossa instilled with 1%  Xylocaine with epinephrine.  A transverse skin incision made through the  left antecubital fossa.  Dissection carried down through the subcutaneous  tissue.  The antecubital network identified.  The cephalic vein was freed.  Bridging vein between the cephalic and the basilic system was ligated with 3-  0 silk and divided.  The cephalic vein freed and tributaries ligated with 4-  0 silk.  The vein ligated distally with 2-0 silk and divided.  The vein then  dilated with heparin and saline solution.  Good size and quality.   The brachial artery was then exposed deep to thee bicipital aponeurosis.  Brachial artery encircled with vessel loops.  The patient administered 2000  units of heparin intravenously.  The artery was controlled proximally and  distally with serrefine clamps.  A longitudinal arteriotomy made.  The vein  divided and anastomosed end-to-side to the brachial artery using running 7-0  Prolene suture.  Clamps were  then removed.  Excellent flow present.  Adequate hemostasis obtained.  Sponge, needle, and instrument counts  correct.   The subcutaneous tissue closed with running 3-0 Vicryl suture.  Skin closed  with 4-0 Monocryl.  Steri-Strips applied.  The patient transferred to the  recovery room in stable condition.                                                Balinda Quails, M.D.    PGH/MEDQ  D:  04/27/2003  T:  04/27/2003  Job:  [id number]

## 2011-02-07 NOTE — Op Note (Signed)
NAME:  [name], [name]                          ACCOUNT NO.:  [id number]   MEDICAL RECORD NO.:  [id number]                   PATIENT TYPE:  INP   LOCATION:  NA                                   FACILITY:  [facility]   PHYSICIAN:  Jimmye Norman III, M.D.               DATE OF BIRTH:  Apr 01, 1961   DATE OF PROCEDURE:  11/06/2003  DATE OF DISCHARGE:                                 OPERATIVE REPORT   PREOPERATIVE DIAGNOSES:  Colostomy with a history of diverticulosis and  diverticulitis.   POSTOPERATIVE DIAGNOSES:  Colostomy with a history of diverticulosis and  diverticulitis.   PROCEDURE:  Takedown colostomy.   SURGEON:  Jimmye Norman, M.D.   ASSESSMENT:  [name], M.D.   ANESTHESIA:  General endotracheal.   ESTIMATED BLOOD LOSS:  1150 mL.   COMPLICATIONS:  None.   CONDITION:  Stable.   SPECIMENS:  Distal colon.   INDICATIONS FOR PROCEDURE:  The patient is a 50 year old with renal failure  and a perforated diverticulitis, who comes in now for a takedown.   FINDINGS:  The patient had on preoperative studies, multiple diverticulosis  throughout the colon, and the only curative procedure would have been a  total colectomy.  The patient declined this procedure and wanted a takedown.  We bring him now for a takedown colostomy.   DESCRIPTION OF PROCEDURE:  The patient was taken to the operating room and  placed on the table in the supine position.  After an adequate endotracheal  anesthetic was administered, we closed the colostomy which is in the left  upper quadrant, using a running locking stitch of #0 silk.  Then prepped and  draped in the usual sterile manner.  A midline incision was made from above  the umbilicus, around the lower midline incision which was widened and  scarred, which had been left over from the previous procedure.  We took out  an [place] of subcutaneous tissue and epidermis because of the widened area,  and then took it down through the midline fascia  using electrocautery.  As  we were retracting against the fascia, a small piece of small bowel was  torn, and an enterotomy made which was repaired using transverse simple  stitches of #3-0 silk.  Once this was done, we took down the adhesions and  subsequently isolated the distal sigmoid colon stump, to which we would do  our anastomosis here.  Once we had adequate exposure and mobilization of the  distal stump, we took down the stoma from the anterior abdominal wall by  making a circular incision around it using a #10 blade, and then dissecting  out from the subcutaneous using electrocautery.  Once this was completely  detached, we mobilized it well enough so that we could do a tension-free  hand-sewn anastomosis.  An Allen clamp was placed across the bowel  proximally and then subsequently transected.  An [employer]  clamp was placed  across the distal bowel, after it was subsequently clean.  We then did a two-  layered anastomosis using #0 silk Lembert stitches on the posterior wall, a  running locking stitch using Connell stitch of #3-0 Vicryl full-thickness,  and then an anterior layer of Lembert stitches.  This was done with minimal  tension to no tension, and then subsequently we closed.   We irrigated with saline.  There was minimal spillage.  Once we had  irrigated with one liter of saline, we closed the abdomen using a running #1  PDS suture.  The stomal site was closed internally and externally with  interrupted figure-of-eight stitches of #1 Vicryl.  All skin sites were  closed using stainless steel staples.  All needle counts, sponge counts and  instrument counts were correct.                                               Kathrin Ruddy, M.D.    JW/MEDQ  D:  11/06/2003  T:  11/06/2003  Job:  [id number]

## 2011-02-07 NOTE — Discharge Summary (Signed)
[name], [name] NO.:  [id number]   MEDICAL RECORD NO.:  [id number]          PATIENT TYPE:  INP   LOCATION:  5525                         FACILITY:  [facility]   PHYSICIAN:  Sharlet Salina T. Hoxworth, M.D.DATE OF BIRTH:  May 29, 1961   DATE OF ADMISSION:  11/01/2004  DATE OF DISCHARGE:  11/06/2004                                 DISCHARGE SUMMARY   DISCHARGE DIAGNOSES:  1.  Small bowel obstruction, resolved.  2.  End-stage renal disease with hemodialysis every Tuesday, Thursday, and      Saturday.  3.  Diabetes mellitus.  4.  Anemia.  5.  Secondary hyperparathyroidism.  6.  History of pancreatitis.  7.  History of carpal tunnel syndrome.  8.  Gastroesophageal reflux disease.  9.  History of bone marrow suppression.  10. Status post sigmoid diverticula.  Status post exploratory laparotomy,      sigmoid colectomy, colostomy, appendectomy in August 2004.   HISTORY:  [name] is a 50 year old male patient with multiple medical  problems as mentioned above.  He had a one day history of crampy lower  abdominal pain, as well as distention.  He has had obstipation as well as  nausea and vomiting.  He presented to [facility] and CT scan revealed  dilated small bowel loops into the right lower quadrant with apparent  transition to normal caliber to the small bowel.  Some contrast in the colon  was noted.  Over the next several days, the patient's symptoms gradually  improved, and by day of discharge had resolved.   During his hospital stay, he was followed by the renal service, and  underwent his usual hemodialysis schedule.   Over his hospitalization, his abdominal symptoms improved, and his diet was  reinstituted without difficulty.  At this point, he is ready for discharge  to home.  He is to resume the following medications:   DISCHARGE MEDICATIONS:  1.  Loprimide p.r.n.  2.  Nephro-Vite daily.  3.  Methimazole 25 mg b.i.d.  4.  Renagel with meals.  5.   Indocin p.r.n.  6.  Protonix 40 mg daily.  7.  Prednisone daily.  8.  Oxycodone p.r.n. pain.   DIET:  He must follow a renal diet.   He is to call our office with any questions or concerns at [phone].   FOLLOWUP:  He is to follow up with Dr. Johna Sheriff on December 12, 2004, at 11  a.m.  He is instructed to arrive 30 minutes early for this visit.   LABORATORY DATA:  Normal hepatic function tests.  BUN 27, creatinine 7.7,  potassium 5, hemoglobin 14.6, hematocrit 43.3, platelets 115.  White blood  cell count 8.9.      LB/MEDQ  D:  11/06/2004  T:  11/06/2004  Job:  [id number]

## 2011-02-07 NOTE — Discharge Summary (Signed)
NAME:  [name], [name]                          ACCOUNT NO.:  [id number]   MEDICAL RECORD NO.:  [id number]                   PATIENT TYPE:  INP   LOCATION:  6735                                 FACILITY:  [facility]   PHYSICIAN:  Aram Beecham B. Eliott Nine, M.D.             DATE OF BIRTH:  [birth year]   DATE OF ADMISSION:  05/06/2003  DATE OF DISCHARGE:  05/19/2003                                 DISCHARGE SUMMARY   ADMITTING DIAGNOSES:  1. Abdominal pain.  2. Progressive renal failure with renal transplant.  3. Anemia of chronic disease.  4. Severe tophaceous gout.  5. Hypertension.  6. Renal transplant mass.  7. Questionable left lower lobe pneumonia.   DISCHARGE DIAGNOSES:  1. Perforated sigmoid diverticulum with contamination and peritonitis.  2. Status post sigmoid colectomy, end colostomy, incidental appendectomy,     May 06, 2003.  3. Pansensitive E. coli peritonitis.  4. End-stage renal disease with failure of kidney transplant, now on chronic     hemodialysis.  5. Status post right IJ Diatek catheter placement, Dr. Darrick Penna, May 10, 2003.  6. Anemia of chronic disease.  7. Hypertension.  8. Tophaceous gout.  9. Left renal mass for open MRI as an outpatient.  10.      Small pericardial effusion and bilateral pleural effusions.   BRIEF HISTORY:  A 50 year old black male with progressive renal failure of  his failing renal transplant, also with hypertension, severe gout, who  presents with a 48-hour history of progressively worsening abdominal pain  and distention.  She was in his usual state of health, relatively free of  symptoms other than fatigue, until about May 04, 2003, when he developed  problems with orthostatic dizziness, and was near syncopal.  He had a low-  grade temperature of 99 degrees, and was instructed to stop his Lasix and  Zaroxolyn.  Later in the day, he developed severe bilateral lower quadrant  abdominal pain that initially waxed and waned, but  then became persistent.  He also had increasing abdominal distention, nausea without vomiting, and a  small amount of hard stool.  He had been unable to drink, eat, or take his  medications.  He had come to the emergency room.   His kidney transplant has been failing, with his creatinine rising to 7.5,  and a BUN of 144 on April 13, 2003.  His phosphorus was 10.1, and calcium 9.7  at that time.  He had undergone placement of a primary AV fistula on April 26, 2004 in anticipation of needing dialysis in the very near future.  He  denies any uremic symptoms, other than significant fatigue.   LABORATORIES ON ADMISSION:  White count 16,000, hemoglobin 11.3, platelets  254,000.  Sodium 138, potassium 3.5, chloride 89, CO2 30, BUN 170,  creatinine 8.1, albumin 2.4, bilirubin 0.6.  UA is negative, except for  protein.  CT of  the abdomen shows inflammatory changes and bowel wall  thickening in the ascending colon, transverse colon, right lower quadrant,  and small bowel loops.  There is possible gas noted in the gallbladder.  There is a mass in his left renal transplant.  There is evidence for small  bowel obstruction in the transition zone, mid small bowel, and in the right  lower quadrant.  There is also noted to be left lower lobe air space  disease.   HOSPITAL COURSE:  #1.  Perforated sigmoidal diverticulum.  The patient was  made NPO, pancultured, and placed on IV Unasyn.  Surgery was consulted, and  they proceeded with an exploratory laparotomy on May 06, 2003.  He was  changed to IV steroids at stress doses.  Flagyl was added empirically.  Pathology report from surgery showed diverticulitis with perforation and  peri-intestinal abscess formation, acute serositis, diverticulosis, and no  atypia or evidence of malignancy.  The appendix pathology showed acute  serositis and fibrous luminal obliteration of the tip.  Postoperatively, he  recovered well but slowly.  He with on TNA for a few  days.  A liquid diet  was started on the fourth postoperative day.  He then developed low-grade  fevers which then spiked to as high as 102.  This peripheral white count  initially normalized, but then started rising again at approximately 12,000.  A repeat CT scan of the belly showed what was called a 6 cm abscess in the  pelvic region.  Under CT guidance, it was tapped and drained of about 30-40  cc of blood-tinged aspirate.  Cultures on this drainage remained negative.  The drain was left in for a couple of days, and then removed.  His fever  defervesced, his white count normalized, and his antibiotics were all  stopped.  His diet was advanced, and he tolerated solids well.  He will  follow up with Dr. Lindie Spruce for wound management.  #2.  End-stage renal disease.  The patient's BUN peaked at 170.  His  creatinine peaked at 8.1.  Dialysis was attempted as early at May 08, 2003, but due to access problems, long-running dialysis could not be  established.  Therefore, on May 10, 2003, Dr. Darrick Penna placed a right IJ  PermCath.  It functioned well, and the patient started regular dialysis on  May 10, 2003.  Arrangements were made for a Tuesday/Thursday/Saturday  dialysis at the [facility].  His dry weight was established at  51 kg.  His left arm fistula placed April 27, 2003 was healing well.  Pulmonary edema seen on chest x-ray early in the hospitalization had  resolved with consistent dialysis and fluid removal.  The patient's  immunosuppressants were all stopped, except for prednisone, which was  ultimately titrated down to 10 mg orally each day.  He was instructed to  stop his Sandimmune.  #3.  Anemia.  The patient received two units of packed cells during this  hospital stay.  His hemoglobins stabilized and ranged in the 9's at the time  of discharge.  He was receiving EPO 10,000 units IV each dialysis.  His transferrin saturation level was 38% with a ferritin level  of 959.  #4.  Hypertension.  The patient was treated with IV Labetalol and a Catapres  patch while he was NPO.  At the time of discharge, he was switched over to  his usual antihypertensive medications of Norvasc, atenolol, and Prinivil.  His blood pressures were approximately 130/70 at  the time of discharge.  #5.  Left renal mass.  Due to patient's anxiety, a closed MRI could not be  done.  An open MRI will be scheduled by the kidney center shortly after  discharge, allowing the patient to recover from this surgery.   DISCHARGE MEDICATIONS:  1. Nephro-Vite vitamin one daily.  2. Prednisone 10 mg daily.  3. Protonix 40 mg q.h.s.  4. Norvasc 10 mg at bedtime.  5. Atenolol 100 mg b.i.d.; hold before dialysis.  6. Prinivil 40 mg at bedtime.  7. Calcijex 0.5 mcg IV each dialysis.  8. InFeD 50 mg IV once a week in dialysis.  9. EPO 10,000 units IV each dialysis.   Dry weight is 51 kg.  Outpatient open MRI to be scheduled to follow up left  renal mass.  Dialysis Tuesday/Thursday/Saturday at 6:45 a.m. at the  [facility]  He was instructed to stop Sandimmune, Bicitra,  Probenecid, and Aranesp.      Zenovia Jordan, P.A.                      Duke Salvia Eliott Nine, M.D.    RRK/MEDQ  D:  07/29/2003  T:  07/29/2003  Job:  [id number]   cc:   [facility]   Dr. Lindie Spruce

## 2011-02-07 NOTE — H&P (Signed)
[place]  Patient:    [name], [name]                      MRN: [id number] Adm. Date:  [id number] Attending:  Lorre Nick                         History and Physical  REASON FOR ADMISSION:  "My mouth is bleeding."  HISTORY OF PRESENT ILLNESS:  The patient is a 50 year old black male with end-stage renal disease secondary to hypertension, status post renal transplant in 1986 maintained on cyclosporin and prednisone.  His baseline creatinine is around 1.8 to 2.  He recently developed anemia and was started two weeks ago on weekly erythropoietin subcutaneous injections.  In addition, he received a course of intravenous iron Dextran from Monday through Thursday this past week (December 10 - 13).  Today, he comes to the emergency room with a two-day history of progressive bleeding from the mouth and bruising of the skin.  Platelet count is found to be less than 10,000 in the emergency room.  PT and PTT are normal and hemoglobin is 8.3.  He denies any systemic symptoms, in particular no fever, confusion, cough, shortness of breath, hemoptysis, or bloody stools.  No abdominal pain and in particular no prior history of bleeding disorders, or platelet abnormalities to his knowledge.  Labetolol also was a new medicine started two weeks ago for hypertension.  PAST MEDICAL HISTORY: 1. Hypertension. 2. End-stage renal disease since 1985 due to hypertension with transplant    in 1986 at [facility]. 3. Chronic gout with chronic bilateral foot pain. 4. Anemia due to chronic renal failure with transplant kidney. 5. Iron deficiency status post recent course of Infed this week and started    on Epogen as above.  MEDICATIONS: Old medications. 1. Prinivil 40 mg b.i.d. 2. Prednisone 10 mg q.d. 3. Cyclosporin 375 mg q.d. suspension. 4. Ultracet p.r.n. 5. Roxalox p.r.n. 6. Nephrovite q.d.  New medications. 1. Labetolol 200 mg b.i.d.  started two weeks ago. 2. Epogen subcu weekly started two weeks ago. 3. Intravenous Infed iron Dextran received as above this past week.  ALLERGIES:  No known drug allergies.  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  He lives in [place].  No tobacco, alcohol, or IV drug abuse.  REVIEW OF SYSTEMS:  GENERAL: No fever, chills, or weight loss.  HEENT: No hearing loss, visual changes, sore throat, or difficulty swallowing.  Mouth bleeding as above.  RESPIRATORY: As above.  CARDIOVASCULAR: No chest pain, history of MI, or angina, orthopnea, or PND.  GASTROINTESTINAL: No abdominal pain, nausea, vomiting, or diarrhea.  GENITOURINARY: No hematuria or voiding difficulty.  MUSCULOSKELETAL: No joint complaints other than his chronic gout deposits in his hands and chronic pain in his feet presumably from gout. NEUROLOGICAL: No history of stroke, TIA, or seizure, and no recent neurologic complaints.  PHYSICAL EXAMINATION:  VITAL SIGNS:  Blood pressure 150/94, pulse 64, respirations 18, temperature 97.7.  GENERAL:  This is an alert, thin, black male in no acute distress.  SKIN:  Shows mild, diffuse, scattered purpura lesions which are nonpalpable about the torso and extremities.  There are a couple of small ecchymoses on the legs.  The feet have faint petechiae scattered over the dorsal surfaces.  HEENT:  PERRLA.  EOMI.  Sclerae anicteric.  Throat  shows several blood clots, one on the inside of the right lip and another in the left upper mouth.  NECK:  Supple without JVD or nodes.  CHEST:  Clear throughout.  HEART:  Regular rate and rhythm without murmurs, rubs, or gallops.  ABDOMEN:  Soft and nontender.  No organomegaly is noted in particular no enlarged spleen that could be detected.  EXTREMITIES:  No edema.  There are deposits in some of the fingers.  NEUROLOGICAL:  Alert and oriented.  Grossly  nonfocal.  LABORATORY DATA:  Sodium 142, potassium 4.7, creatinine 1.7, BUN 43.  White blood cell count 7.0, hemoglobin 8.3, hematocrit 25%, and platelets less than 10,000.  PT 11.9, PTT 26, albumin 2.5, calcium 8.5, liver enzymes normal.  IMPRESSION: 1. Severe thrombocytopenia presumably of new onset, most likely drug-induced    versus idiopathic.  No systemic symptoms.  Thrombocytopenia has been    described with iron Dextran and seems the most likely culprit at this    time.  Also possibility that labetolol or idiopathic. 2. Renal transplant. 3. Hypertension. 4. Chronic gout.  PLAN: 1. Admit to transfuse platelets due to bleeding in face of severely low    platelet count. 2. Hematology consult. 3. Hold labetolol. 4. No further IV iron Dextran (course completed on December 14). DD:  09/06/00 TD:  09/06/00 Job: [id number]

## 2011-02-07 NOTE — H&P (Signed)
NAMEFAYSAL, FENOGLIO NO.:  [id number]   MEDICAL RECORD NO.:  [id number]          PATIENT TYPE:  INP   LOCATION:  5525                         FACILITY:  [facility]   PHYSICIAN:  Adolph Pollack, M.D.DATE OF BIRTH:  [birth year]   DATE OF ADMISSION:  11/01/2004  DATE OF DISCHARGE:                                HISTORY & PHYSICAL   CHIEF COMPLAINT:  Abdominal cramping, distention, nausea, and vomiting.   HISTORY OF PRESENT ILLNESS:  Mr. Perko is a 50 year old male with extensive  recent abdominal surgical history. He was in his normal state of health  until yesterday when he began having some intermittent cramping pain. He was  seen in the ED. He was then sent out but then returned with bloating,  obstipation, nausea, and vomiting. He was evaluated in the emergency  department and a CT scan was performed. This demonstrates a small bowel  obstruction and I was asked to see him. He has had no fevers, chills, or  diarrhea.   PAST MEDICAL HISTORY:  1.  End-stage renal disease.  2.  Steroid dependency.  3.  Hypertension.  4.  Sigmoid diverticulitis with perforation.  5.  Gouty arthritis.  6.  Gastroesophageal reflux disease.  7.  Anemia.  8.  Secondary hyperparathyroidism.  9.  Urinary tract infection.  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   PAST SURGICAL HISTORY:  1.  Vascular accesses upper extremities.  2.  Renal transplantation (this has failed).  3.  Left inguinal hernia.  4.  Emergency exploratory laparotomy, partial colectomy in August 2004.  5.  Colostomy closure in February 2005.  6.  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   ALLERGIES:  ASPIRIN and ALLOPURINOL.   CURRENT MEDICATIONS:  Colchicine, Nephro-Vite, prednisone, Protonix.   SOCIAL HISTORY:  Denies tobacco, alcohol, or drug use.   FAMILY HISTORY:  Positive for hypertension.   REVIEW OF SYMPTOMS:  CARDIOVASCULAR:  He has a known heart murmur and has  had so for many years. He denies any  myocardial infarction. PULMONARY:  He  denies any shortness of breath currently. Denies pneumonia or asthma. GI:  He does have a history of pancreatitis in the past. He denies any hepatitis.  GU:  He makes a small amount of urine a day. HEMATOLOGIC:  No known blood  clots.   PHYSICAL EXAMINATION:  GENERAL:  A thin male. He is in no acute distress,  pleasant, and cooperative.  VITAL SIGNS:  Temperature is 98.1, blood pressure 132/91, pulse 91. O2  saturation are 95% on room air.  NECK:  Supple without palpable masses or obvious thyroid enlargement.  LUNGS:  Breath is labored.  CARDIOVASCULAR:  Regular rate and rhythm with an audible murmur.  EXTREMITIES:  He has no lower extremity edema.  ABDOMEN:  Slightly distended and mildly firm. There is tympany to  percussion. No significant tenderness by palpation or percussion.  Intermittent bowel sounds are heard. Multiple well healed scars including a  midline scar, left groin scar, and hockey-stick shaped left abdominal scar.  Also a small transverse left mid-abdominal scar.  RECTAL:  Normal tone. There  is some firm stool in the vault but it is not  obstructive.  MUSCULOSKELETAL:  He demonstrates muscular wasting in the upper and lower  extremities.   LABORATORY DATA:  BUN 53, glucose is 124. His potassium on recheck is 5.2.  White count is 13,200, hemoglobin 19.8, platelet count is 165,000.   CT scan was reviewed. It does demonstrate what appears to be dilated loops  of small bowel with transition showing in the right lower quadrant  consistent with more of an adhesive-type small bowel obstruction. There is  no free air present. A very small amount of free fluid. There appears to be  some contrast getting into the colon as well.   IMPRESSION:  1.  Small bowel obstruction most likely adhesive in nature, compromised      bowel at this point and time.  He has had multiple previous abdominal      surgeries.  2.  End-stage renal disease:   Due for dialysis tomorrow.  3.  Dehydration.   PLAN:  Admit to the hospital. Gentle low wall suction. Follow-up x-rays  tomorrow. I told him if he had failure with progression he may need a  laparotomy for small bowel obstruction and he understands this.      TJR/MEDQ  D:  11/01/2004  T:  11/01/2004  Job:  [id number]

## 2011-02-07 NOTE — Procedures (Signed)
[place]. [facility]  Patient:    [name], [name]                      MRN: [id number] Adm. Date:  [id number] Attending:  Delano Metz D                           Procedure Report  PREOPERATIVE DIAGNOSES:  Thrombocytopenia, anemia.  POSTOPERATIVE DIAGNOSES:  Thrombocytopenia, anemia.  PROCEDURE:  Bone marrow core biopsy.  SURGEON:  Dolan Amen, M.D.  DESCRIPTION:  Two inches, 5 cm sized bone marrow core obtained.  Yellow with minimal areas of pinkish color.  Microscopically profoundly abnormal.  DESCRIPTION OF PROCEDURE:  Left posterior superior iliac spine.  Anesthesia, local lidocaine, IV Versed.  No complications.  Core submitted to pathology. DD:  09/09/00 TD:  09/09/00 Job: [id number]

## 2011-02-07 NOTE — Discharge Summary (Signed)
[place]  Patient:    [name], [name]                      MRN: [id number] Adm. Date:  [id number] Disc. Date: [id number] Attending:  Delano Metz D Dictator:   Claria Dice, P.A. CC:         [facility]  Dolan Amen, M.D.   Discharge Summary  ADMISSION DIAGNOSES: 1. Thrombocytopenia. 2. Anemia. 3. Hypertension. 4. Gout. 5. End-stage renal disease with renal transplant in 1986 at [facility], currently with chronic renal failure.  DISCHARGE DIAGNOSES: 1. 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 2. Chronic renal failure. 3. Anemia. 4. Polyarticular gout. 5. Hepatitis A antibody positive. 6. Hepatitis B surface antigen positive.  CONSULTATIONS: Dolan Amen, M.D., hematology and oncology who followed throughout hospital admission.  Dr. Nevada Crane performed a bone marrow core biopsy and a bone marrow aspiration.  Bone marrow aspirate revealed a hunk of cellular marrow with many megacaryocytes.  Adequate storage iron was seen.  HISTORY & PHYSICAL:  Mr. Hoogendoorn is a 50 year old black male who presented to the emergency department on December 16 with a primary complaint of bleeding from his mouth.  He has end-stage renal disease secondary to hypertension.  He is status post renal transplant in 1986 maintained on cyclosporin and prednisone.  His baseline creatinine is 1.8 to 2.  He recently developed anemia and was started on erythropoietin by subcutaneous injections two weeks ago.  In addition, he received a course of IV iron dextran from Monday through Thursday, from December 10 through December 13.  Today, he comes to the emergency room with a two-day history of progressive bleeding from the mouth and bleeding of the skin.  Platelet count was found to be less than 10,000 in the emergency room.  PT and PTT are normal, and hemoglobin is 8.3.  He denies the significant  symptoms, in particular, no fever, or shortness of breath, hemoptysis, or bloody stools.  No abdominal pain, and, in particular, no prior history of bleeding disorders or platelet abnormalities to his knowledge.  Labetalol is also a new medicine that was started two weeks ago for hypertension.  PHYSICAL EXAMINATION:  (Pertinent positives)  SKIN:  Mild diffuse scattered purpura lesions which are nonpalpable on the torso and the extremities.  Some small ecchymosis on the legs, faint petechiae scattered over the dorsal surfaces.  HEENT:  Mouth has several blood clots on the inside of the right lip and another in the left upper mouth.  HOSPITAL COURSE:  The patient was admitted for platelet transfusions and an oncology and hematology workup.  Initially, the InFeD and labetalol were discontinued from his medication list.  When it was found that the platelet count failed to increase, all medications given previous to December 16 were held.  He received a total of 3 platelet transfusions; but after each infusion, the count seemed to drop rather quickly which Dr. Nevada Crane seemed to think was a destructive mechanism.  While the other medications were held, clonidine was given to control his hypertension.  Cellular marrow was less than 10% cellularity.  Numerous megacaryocytes were present, and hematopoietic ______ .  He was given prednisone 60 mg q.d.  By December 24, numerous lab studies had been performed.  Pertinent positives include platelet count 42,000,one hour post transfusion decreased to 24,000 five hours post transfusion to 13,000 ten hours post transfusion.  WBC at admission was 7.0,  discharge 21.9.  RBC discharge 3.16, hemoglobin 9.3, hematocrit 29.0 with platelets at 38,000 at discharge.  CBC manual smear revealed polychromasia, tear drop cells, ______  and schizocytes.  reticulocyte count was 1.5 on December 17, December 18 was 2.7, December 21 was 1.7, December 24 was  4.0 percent.  Coagulation studies on December 16, PTT 11.9, INR 0.9. PTT 26. Chemistries at discharge on December 23, sodium 136, potassium 4.8, chloride 104, CO2 30, glucose 192, BUN 55, creatinine 1.9, calcium 8.5.  HIV test was negative on December 16.  On December 21, hepatitis A antibody positive. Hepatitis B surface antigen negative.  Hepatitis B surface antibody positive. Hepatitis C antibody is negative.  Haptoglobin on December 18 was 127. Erythropoietin level was 7.0.  Blood type is B positive.  Rheumatoid factor RA is 330 which is high normal limits being 0 to 20.  CMV IgG antibody 3.08 which is interpreted as a positive result.  CMV IgM 1.26 also positive.  EBV antibody capsid IgG 16.74 also interpreted as a positive which indicates a ______ in her previous EBV infection.  EVV IgM was 0.0 which is a negative a significant level of detectable IgM antibody to EBV viral capsid.  EBV antibody to nuclear antigen 12.92 interpreted positive.  EBV AB to early B antigen 1.87 positive.  ANA negative.  Paroxysmal nocturnal hemoglobinuria test was performed results of which not available at this time.  Also HLA A and B phenotype were performed with no results in the chart at this time either.  CONDITION UPON DISCHARGE:  Guarded.  DISPOSITION:  Discharged to home.  DISCHARGE MEDICATIONS: 1. Prednisone 60 mg daily. 2. Cyclosporin 375 mg daily. 3. Clonidine 0.2 mg b.i.d.  The prednisone and the Clonidine were called in to the patients pharmacy.  DISCHARGE INSTRUCTIONS:  The patient is to follow a low protein renal diet of 80 g.  He is to go by the [facility] office on Wednesday for a blood pressure check and have a CBC and renal profile drawn.  He is also to follow up with Dr. Nevada Crane [phone]) at the hematology office on December 27 at 12:30 to have some labs drawn, also to see him December 31 at 9:30 for more labs to be drawn and to see the medical doctor at 10  oclock. DD:  09/30/00  TD:  10/01/00 Job: [id number]

## 2011-02-07 NOTE — Op Note (Signed)
NAME:  LEMARCUS, BAGGERLY                          ACCOUNT NO.:  [id number]   MEDICAL RECORD NO.:  [id number]                   PATIENT TYPE:  INP   LOCATION:  2105                                 FACILITY:  [facility]   PHYSICIAN:  Jimmye Norman III, M.D.               DATE OF BIRTH:  [birth year]   DATE OF PROCEDURE:  05/06/2003  DATE OF DISCHARGE:                                 OPERATIVE REPORT   PREOPERATIVE DIAGNOSIS:  Peritonitis, unknown etiology, possible bowel  obstruction.   POSTOPERATIVE DIAGNOSIS:  Perforated sigmoid diverticulum with contamination  and peritonitis.   PROCEDURES:  1. Exploratory laparotomy.  2. Sigmoid colectomy.  3. End colostomy.  4. Incidental appendectomy.   SURGEON:  Jimmye Norman, M.D.   ASSISTANT:  Angelia Mould. Derrell Lolling, M.D.   ANESTHESIA:  General endotracheal.   ESTIMATED BLOOD LOSS:  100-150 mL.   COMPLICATIONS:  None.   CONDITION:  Fair.   FINDINGS:  The patient had generalized peritonitis with some fibrinous  exudate on multiple loops of small bowel which were attached to the inflamed  sigmoid colon which was also attached to the peritoneum overlying the  patient's left lower quadrant renal allograft.  The appendix had evidence of  periappendicitis but was not acutely inflamed indigenously.  The liver,  gallbladder and spleen all appeared to be palpably normal.  We ran the small  bowel from the ligament of Treitz down to the terminal ileum and found no  evidence of any separate disease.   INDICATIONS:  The patient is a 50 year old who developed abdominal pain  starting Thursday morning which worsened over the last several days to where  he could hardly move without significant discomfort.  CT scan demonstrated  evidence of an ileus and/or bowel obstruction and possible air in the  gallbladder but it was his clinical exam that determined the need for an  operation.   DESCRIPTION OF PROCEDURE:  The patient was taken to the operating room and  placed on the table in the supine position.  After an adequate endotracheal  anesthetic was administered, he was prepped and draped in the usual sterile  manner exposing the midline of the abdomen.   We kept the incision in the infraumbilical layer.  It went down to just  above the patient's previous suprapubic scar, possibly from a previous  suprapubic tube.  We took it down to and through the midline fascia using  #10 blade which entered [place] into the peritoneal cavity.  Once we had done so,  we elongated the incision inferiorly and superiorly using the  electrocautery.  It was noted at this point that the patient did ooze fairly  easily from all the cutting edges, possibly secondary to coagulopathy which  was not noted preoperatively.   Immediately upon entering the peritoneal cavity, cloudy peritoneal ascites  was noted. Aerobic and anaerobic cultures were taken.  We then were  able to  localize the area of the acute inflammation to the mid sigmoid colon which  was attached to the peritoneum and overlying the patient's renal allograft.  There was no perforation of the area, no direct examination of the graft.  We were able to proximally and distally transect the perforated area of the  sigmoid colon.  After exploring the entire abdomen, there were no other  areas of inflammation.  The resection was done between GIA 7.5 stapler and  we then brought out an end colostomy in the left upper to mid quadrant of  the abdomen.  Prior to doing so though, we did irrigate with about three  liters of warm saline solution after resecting the colon.  We also did an  incidental appendectomy, ligating the base with an 0 chromic tie and  inverting them to the base of the cecum using a V-stitch of 2-0 silk and a  pursestring 2-0 silk.  Once this was done and we had adequately irrigated  and resected the colon we marked the distal end of the colon using a loop  stitch of 2-0 Prolene.  The colostomy was  __________ closure of the abdomen  using interrupted 3-0 Vicryl sutures.  The midline fascia was closed after  irrigating using a running #1 PDS.  The midline wound was left open with  packing with wet-to-dry saline dressings.                                               Kathrin Ruddy, M.D.    JW/MEDQ  D:  05/06/2003  T:  05/07/2003  Job:  [id number]

## 2011-02-07 NOTE — Consult Note (Signed)
NAME:  [name], [name]                          ACCOUNT NO.:  [id number]   MEDICAL RECORD NO.:  [id number]                   PATIENT TYPE:  INP   LOCATION:  5705                                 FACILITY:  [facility]   PHYSICIAN:  Terrial Rhodes, M.D.             DATE OF BIRTH:  [birth year]   DATE OF CONSULTATION:  DATE OF DISCHARGE:                                   CONSULTATION   REASON FOR CONSULTATION:  End-stage renal disease on dialysis Tuesday,  Thursday and Saturday.   HISTORY OF PRESENT ILLNESS:  [name] is a 50 year old black male, past  medical history significant for sigmoid colectomy and colostomy, done in  August 2004, for sigmoid diverticulosis with peritonitis, who was admitted  today for a takedown colostomy.  [name] gets dialysis Tuesday, Thursday  and Saturday at [facility].  He was last dialyzed on  Saturday.  He is post-op now and complains of some pain in the abdomen.  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  He is on  immunosuppressive therapy with 5 mg of prednisone p.o. daily.   ALLERGIES:  1. ALLOPURINOL causes decreased platelets.  2. ASPIRIN.   PAST MEDICAL HISTORY:  1. End-stage renal disease secondary to presumed hypertension, status post     cadaveric renal transplant in 1987 at [facility].  Resumed hemodialysis on     April 26, 2003.  2. Perforated sigmoid diverticulosis with contamination peritonitis in     November 2004.  3. Status post sigmoid colectomy and colostomy in August 2004.  4. Severe tophaceous gout.  5. 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  6. Hypertension.  7. Anemia of chronic disease.  8. Secondary hyperparathyroidism.  9. History of shingles.  10.      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  11.      History of carpal tunnel syndrome.  12.       History of pancreatitis.  13.      History of hemorrhoids.  14.      Reflux esophagitis.   MEDICATIONS:  In hospital:  1. Hydrocortisone IV 100 q.12h. x 1, then 50 q.12h. x 2, 25 q.12h. x 2 and     25 q.24h. x 1.  2. Cefoxitin 1 gram IV q.6h. x 48 hours.  3. Protonix 40 mg IV q.24h.  4. Atenolol 100 mg one p.o. every day.  5. Reglan IV 5 mg q.8h.  6. Phenergan 12.5 q.4-6h. p.r.n.  7. Morphine 3-6 mg IV q.1-2h. p.r.n.   At home medications are:  1. Atenolol 100 mg one p.o. daily.  2. Nephro-Vite one p.o. daily.  3. Prednisone 5 mg one p.o. daily.  4. Protonix 40 mg one p.o. daily.  5. Calcium carbonate 2 p.o. t.i.d.  6. InFeD  50 mg IV every Thursday with dialysis.  7. EPO 4,000 units Tuesday, Thursday and Saturday IV every dialysis.   SOCIAL HISTORY:  He lives with his sister.  Smoker, quit in 1980s.  And also  a history of alcohol use, quit in 1980s.  Not presently employed.   FAMILY HISTORY:  Father died of a heart attack.  Brother and sister have  hypertension.  Mother died of unknown causes.   REVIEW OF SYSTEMS:  No history of chest pain, nausea, vomiting, headache,  shortness of breath or orthopnea, PND.  No flatus at this time.   PHYSICAL EXAMINATION:  VITAL SIGNS:  Pulse 58 per minute, blood pressure  154/70, temperature 96.6, respiratory rate of 14 per minute, O2 sat is 100%  on 2 liters.  CARDIOVASCULAR:  Regular rate and rhythm.  ABDOMEN:  Soft.  Bandage is present.  Faint bowel sounds heard.  LUNGS:  Clear to auscultation bilaterally.  No wheezing, rhonchi or  crepitation.  EXTREMITIES:  No edema, cyanosis or clubbing.  HEENT:  Atraumatic, normocephalic.  Eyes, PERRLA.  Extraocular movements  intact.  NECK:  No JVD.  NEUROLOGIC:  Intact.   LABORATORIES:  On November 01, 2003:  Sodium 137, potassium 4.6, chloride 96,  bicarb 30, BUN 40, creatinine 8.4 and serum glucose of 97.  White count was  8.3, hemoglobin 13.3, hematocrit 40.9, and platelets 323.  Total bili  0.6,  alkaline phosphatase 74, AST 635, ALT 35, total protein 7, albumin 2.4 and  calcium 10.5.  His i-STAT after OR shows a glucose of 85, a sodium of 133,  potassium 4.9, hematocrit 41 and hemoglobin 13.9.   ASSESSMENT/PLAN:  1. End-stage renal disease secondary to hypertension.  Dialysis on Tuesday,     Thursday and Saturday.  Dialyzed last on Saturday.  He is due tomorrow.     Estimated dry weight of 67.9 kilos.  Will need dialysis tomorrow, write     orders.  Check complete blood count and renal panel now.  2. Status post cadaveric renal transplant on prednisone at home, on     intravenous hydrocortisone stress dose post surgery.  3. 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  Undergoing outpatient workup at this     time.  4. Anemia secondary to renal failure on InFeD 50 mg intravenous every     Thursday and EPO with each dialysis 4,000 units.  5. Hypertension.  Started back on his home medications.  6. Status post takedown of colostomy per Dr. Lindie Spruce.  7. Reflux esophagitis on Protonix.  8. Secondary hyperparathyroidism on calcium carbonate.     Adonis Housekeeper, M.D.                         Terrial Rhodes, M.D.    TS/MEDQ  D:  11/06/2003  T:  11/06/2003  Job:  [id number]

## 2011-02-07 NOTE — Consult Note (Signed)
[place]  Patient:    [name], [name]                      MRN: [id number] Adm. Date:  [id number] Attending:  Lorre Nick                          Consultation Report  DATE OF BIRTH:  1961/06/13  REASON FOR CONSULTATION:  Thrombocytopenia, bleeding.  HISTORY OF PRESENT ILLNESS:  [name] is a 50 year old man with hypertension.  He had end-stage renal disease in 1986 and had a kidney transplant at [facility].  His baseline creatinine is reported around 2.  The patient got more anemic lately.  I do not know his platelet count prior to the current events, but it was probably not decreased.  Recently, he was started on labetolol (two weeks ago).  Last week, he was given IV iron (Infed) on December 10 - December 13.  On December 14, the patient noted bleeding from his mouth.  This swollen his left cheek became swollen and uncomfortable. He had more bleeding from his mouth.  He was came to the emergency room and was found with platelet count less than 10 in the emergency department and was admitted.  I was asked to see the patient in consultation for thrombocytopenia.  PAST MEDICAL HISTORY:  Hypertension.  Kidney transplant in 1986 at [facility], gout, and cataracts.  MEDICATIONS: 1. Cyclosporin 375 mg p.o. q.d. 2. Prinivil. 3. Ultracet. 4. Roxilox. 5. Nephrovite. 6. Prednisone 10 mg p.o. q.d. 7. Allopurinol 300 mg p.o. q.d. 8. Labetolol started for the last two weeks. 9. Infed given December 10 through December 13, daily.  Other than the Labetolol and Infed, his previous medications have been unchanged for long period of time.  ALLERGIES:  No known drug allergies.  SOCIAL HISTORY:  Nonsmoker, no alcohol.  Single and lives alone.  No children. He is employed.  FAMILY HISTORY:  His brother has known ______.  REVIEW OF SYSTEMS:  CONSTITUTIONAL:  The patient feels tired after transfusions.  His energy level nevertheless is good.  His  appetite is good. Occasional headache.  No nightsweats, no fever, and no weight loss.  HEENT: No complaints.  Eyes; he had cataract surgery.  GASTROINTESTINAL: No complaints. CARDIOVASCULAR: RESPIRATORY: No complaints.  MUSCULOSKELETAL:  He has occasional joint pain and is known to have gout.  SKIN: He noticed some red dots and bruises on his skin for the last two days.  PHYSICAL EXAMINATION:  GENERAL:  Middle-aged man, comfortable.  VITAL SIGNS:  Blood pressure 140/88, respirations 14, temperature 64 and he is afebrile.  HEENT:  Head is atraumatic and normocephalic.  Eyes; pupils are equally round and reactive to light and accommodation.  Extraocular muscles are intact. Ears, nose, and throat grossly normal.  Mouth; the patient has a left vestibular hematoma 5 x 6 cm and a few other ecchymoses and small hematomas on the tongue and vestibular mucosa.  The face is slightly deformed with swelling on the left side.  NECK:  There is no JVD, no lymphadenopathy, no thyromegaly, and no bruit.  CHEST:  Clear.  HEART:  Regular.  ABDOMEN:  Soft and benign.  No organomegaly and no masses.  There is no edema.  LYMPHS:  There is no peripheral edema.  I cannot palpate any large lymph nodes in any of the lymph node bearing areas.  SKIN:  The patient has  diffuse petechiae and small ecchymoses on his dorsal limbs.  LABORATORY DATA:  His white count is 7, hemoglobin 8.3, MCV 88.5, RDW 18.2, platelet count less than 10.  Neutrophils 70%.  BUN 43, creatinine 1.7, albumin 2.5.  PT is 11.9, and PTT is 26.  IMPRESSION: 1. Severe thrombocytopenia, probably acute.  The patient is not aware of a    history of thrombocytopenia and he has been under close monitoring for    his kidney problems.  The differential diagnosis includes idiopathic ITP,    drug-induced ITP (new medications, iron and labetolol), other (HIV-related,    bone marrow processes, marrow dysplasia, acute leukemia, etc., other).   The    patient has bleeding in his mouth and I agree with the platelets the    patient received.  I will take him as an ITP, give him prednisone 1 mg/kg    per day p.o. or IV.  Would discontinue labetolol and the iron product.    Would use another iron product in the future if need be.  I would also    obtain an HIV test, ANA, and I will try to obtain his previous hemograms    from [facility]. 2. Anemia probably secondary to chronic renal failure.  The patient was    recently started on erythropoietin.  I will try to obtain old CBCs and    blood work from Dr. Heloise Beecham office.  As per history of present illness,    he was given iron over the last week.  Apparently his anemia got worse    lately and in the context of anemia and thrombocytopenia, a bone marrow    biopsy may be indicated to rule out an underlying pathology.  Will comment    more on this after the previous CBCs become available. 3. Chronic renal failure, relatively stable, mild.  The patient is status post    kidney transplant. 4. Protein malnutrition with an albumin of 2.5.  DD:  09/06/00 TD:  09/06/00 Job: [id number]

## 2011-02-07 NOTE — Op Note (Signed)
NAME:  [name], [name]                          ACCOUNT NO.:  [id number]   MEDICAL RECORD NO.:  [id number]                   PATIENT TYPE:  INP   LOCATION:  6735                                 FACILITY:  [facility]   PHYSICIAN:  James L. Deterding, M.D.            DATE OF BIRTH:  20-May-1961   DATE OF PROCEDURE:  05/10/2003  DATE OF DISCHARGE:                                 OPERATIVE REPORT   PROCEDURE:  Diatek PermCath insertion.   SURGEON:  James L. Deterding, M.D.   INDICATIONS:  Access for hemodialysis.  The procedure was explained to the  patient.  Both benefits and risks were understood and accepted.   DESCRIPTION OF PROCEDURE:  The patient was taken to the fluoroscopy suite  and placed on the fluoroscopy table in the supine position with a towel  rolled between the shoulders.  Initially, the Site-Rite ultrasound was used  to locate the internal jugular vein and the middle one-third of the  sternocleidomastoid triangle to locate its position, depth and patency.  Subsequently, the right side of the neck and right upper chest were prepped  with Betadine.  Local anesthesia with 2% Xylocaine was used to numb up the  area over the sternocleidomastoid triangle and the inferior lateral fascia,  approximately 8 x 4 cm.  Using the Site-Rite ultrasound in the sternal  manner and an 18 gauge single wall needle, the internal jugular vein was  cannulated.  The J-tipped guidewire was advanced to engage the thin wall  needle in the internal jugular vein, the vena cava and the inferior vena  cava under fluoroscopic guidance.  The 18 gauge thin wall needle was  withdrawn and the opening where the guidewire went through the skin was  dilated with sharp dissection, approximately 1 cm.  Serial dilators were  placed over the guidewire starting with a 10 [place] then going through 12  then 14 [place] to a loss of resistance in the skin, subcutaneous tissue, and  vessel wall.  Subsequently, a 16 French  trocar with tear-away sheath was  placed over the guidewire and advanced into the internal jugular vein and  superior vena cava under fluoroscopic guidance.  A nonserrated dialysis  clamp was placed around the trocar and sheath just outside the skin.  Both  limbs of the double lumen 20 cm Diatek PermCath were flushed with saline and  the distal end clamped.  The trocar was then withdrawn from the sheath, the  sheath clamped with the dialysis clamp and both lumens of the Diatek  PermCath started at the distal end of the sheath, the venous limb looping  lateral.  As the wire was withdrawn, the clamp was unclamped.  The catheter  was advanced through the remainder of the sheath with tips coming to rest at  the superior vena cava and right atrial junction and tear-away sheath  removed.  Position was confirmed once again.  The  7 cm curvilinear tunnel  was performed starting with a 0.5 cm stab wound, 3 cm inferior to the  clavicle, approximately 7 cm inferior lateral vein entry.  Through the  distal stab wound, the blunt tunneling device was placed in the subcutaneous  tissue and advanced up through the vein entry opening in a curvilinear  manner.  The tunnel dilator was placed over the proximal tunneling device,  and the skin and subcutaneous tissue dilated with the tunnel dilator.  Subsequently, the tunneling device was connected to the distal end of the  catheter, the catheter was pulled through the tunnel, a Dacron cuff of  approximately 4 cm from the tunnel  exit site.  The catheter was then clamped, cut off and friction screw-on  connecting device was applied.  The catheter was flushed with saline and  concentrated heparin and heparin locked.  The skin over the vein entrance  was closed with 3-0 silk and sutures on both limbs of the catheter with 3-0  silk also.  Hypafix dressing was applied.                                               James L. Deterding, M.D.    JLD/MEDQ  D:   05/10/2003  T:  05/11/2003  Job:  [id number]   cc:   [facility]

## 2011-02-07 NOTE — Op Note (Signed)
[place]  Patient:    Stephen, Mora                      MRN: [id number] Proc. Date: 02/09/01 Adm. Date:  [id number] Attending:  Susa Day CC:         Oley Balm Charlann Boxer, M.D.   Operative Report  PREOPERATIVE DIAGNOSIS:  Fungating/infected draining tophus, right index finger, distal interphalangeal flexion crease region and middle phalangeal segment.  POSTOPERATIVE DIAGNOSIS:  Fungating/infected draining tophus, right index finger, distal interphalangeal flexion crease region and middle phalangeal segment.  OPERATION PERFORMED:  Excision of a fungating tophus, right index finger.  SURGEON:  Katy Fitch. Sypher, Montez Hageman., M.D.  ASSISTANT:  Jonni Sanger, P.A.  ANESTHESIA:  General by LMA.  SUPERVISING ANESTHESIOLOGIST:  Dr. Michelle Piper.  INDICATIONS FOR PROCEDURE:  [name] is a 50 year old gentleman with a history of chronic hypertension who is 14 years status post a cadaveric renal transplant.  He has chronic azotemia with creatinines running between 2 and 2.9.  He has had problems with chronic tophaceous gout and has been unable to tolerate allopurinol due to pancytopenia as a consequence of allopurinol. He also is unable to take nonsteroidal anti-inflammatory medications due to his transplant.  He was referred by Dr. 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 Dr. Charlann Boxer had treated him with probenecid and Keflex prior to presentation but unfortunately he was unable to prevent the spontaneous drainage from a massive tophus forming on the right index finger at the distal interphalangeal flexion crease and middle phalangeal segment.  Clinical examination on Feb 08, 2001 revealed a chronically infected draining tophus.  We recommended acute excision of the tophus and incision and drainage of his fungating draining wound.  DESCRIPTION OF  PROCEDURE:  [name] was brought to the operating room and placed in supine position on the operating table.  Following induction of general anesthesia, the right arm was prepped with Betadine soap and solution and sterilely draped.  The right arm was exsanguinated with an Esmarch bandage and an arterial tourniquet on the proximal brachium was inflated to 220 mmHg. The procedure commenced with a thorough curettage of the fungating tophus.  A considerable amount of urate crystals were removed.  There were multiple areas of granulation tissue interspersed between the deposits of urate crystals.  An extensive curettage of the distal phalangeal segment and middle phalangeal segment of the finger was accomplished.  Care was taken to preserve the skin margins to allow healing of the wound by secondary intention.  Care was also taken to very carefully avoid the region of the radial and ulnar neurovascular bundles which were dissected around with blunt curets only.  The wound was thoroughly lavaged with sterile saline to try to dissolve as much of the uric acid material as possible.  The wound was ultimately dressed open with Xeroflo, sterile gauze and Ace wrap.  We will have [name] return to our office in follow-up in 24 hours to begin serial whirlpool therapy to try to continue to dilute the uric acid crystal deposits.  There were no apparent complications.  [name] will continue on antibiotics in the perioperative period in the form of Keflex 500 mg 1 p.o. b.i.d. x 5 days as a prophylactic antibiotic and is given Percocet 5 mg 1 p.o. q.4-6h. p.r.n. pain, 20 tablets without refill. We anticipate seeing  her back in our office in 24 hours for a dressing change and re-evaluation of his wound status.  DESCRIPTION OF PROCEDURE: DD:  02/09/01 TD:  02/09/01 Job: [id number]

## 2011-02-07 NOTE — Op Note (Signed)
NAMETRISTEN, LUCE NO.:  [id number]   MEDICAL RECORD NO.:  [id number]          PATIENT TYPE:  OIB   LOCATION:  2899                         FACILITY:  [facility]   PHYSICIAN:  Thornton Park. Daphine Deutscher, MD  DATE OF BIRTH:  07/16/1961   DATE OF PROCEDURE:  DATE OF DISCHARGE:  09/04/2004                                 OPERATIVE REPORT   PREOPERATIVE DIAGNOSIS:  Condyloma acuminata of the anus.   POSTOPERATIVE DIAGNOSIS:  Condyloma acuminata of the anus.   OPERATION PERFORMED:  Bovie excision and laser ablation of anal warts.   SURGEON:  Thornton Park. Daphine Deutscher, MD.   DESCRIPTION OF PROCEDURE:  [name] is a 50 year old man on dialysis,  who was taken to room 17 on December 14 and after general anesthesia was  administered and he was in the supine position with his legs apart, I  prepped him with Betadine and draped him sterilely.  I excised the large  pedunculated lesions as well as some that were more sessile with the  electrocautery on the cutting current.  Coag was used.  Came back with a CO2  laser and lasered any other remaining smaller condylomata acuminata.  Seem  to have controlled all the perianal condylomata although I told him  preoperatively that this is sometimes a first step and that multiple  procedures may need to be done.  Betadine ointment was massaged into the  area and he awakened from anesthesia to go home.      Matt   MBM/MEDQ  D:  09/04/2004  T:  09/04/2004  Job:  [id number]

## 2011-02-07 NOTE — Discharge Summary (Signed)
[name]                          ACCOUNT NO.:  [id number]   MEDICAL RECORD NO.:  [id number]                   PATIENT TYPE:  INP   LOCATION:  5532                                 FACILITY:  [facility]   PHYSICIAN:  Terrial Rhodes, M.D.             DATE OF BIRTH:  1961-08-30   DATE OF ADMISSION:  01/06/2004  DATE OF DISCHARGE:  01/17/2004                                 DISCHARGE SUMMARY   DISCHARGE DIAGNOSES:  1. Escherichia coli urinary tract infection with pyelocystitis and     epididymitis.  2. Methicillin-resistant Staphylococcus aureus pneumonia.  3. Methicillin-resistant Staphylococcus aureus infection of the left elbow.  4. End-stage renal disease on hemodialysis.  5. Hypotonic bladder.  6. Anemia.  7. Gout with acute flare during hospitalization.  8. Secondary hyperparathyroidism.  9. Hypertension.  10.      Perforated sigmoid diverticula and a colectomy and colostomy in     August 2004.  11.      Colostomy takedown in February 2005.  12.      Gastroesophageal reflux disease.   DISCHARGE MEDICATIONS:  1. Nephro-Vite one tab daily.  2. Bethanechol 25 mg q.i.d.  3. Renagel three tabs with meals.  4. Indocin SR b.i.d. p.r.n.  5. Protonix 40 mg daily.  6. Prednisone 20 mg x 2 days, then 15 mg x 2 days, then 10 mg x 2 days, then     5 mg daily.  7. Oxycodone 5 mg, 1-2 tabs q.6h. p.r.n. pain.   HOSPITAL FOLLOWUP:  1. Dr. Excell Seltzer. Wrenn with urology on Jan 24, 2004 at 10:45 a.m.  2. The patient will resume outpatient hemodialysis schedule.   CONSULTS:  Dr. Annabell Howells of urology on January 07, 2004.   PROCEDURES:  1. January 06, 2004, chest x-ray:  No acute disease.  2. January 06, 2004, spiral CT of the chest:  No CT scan evidence for a     pulmonary emboli.  3. January 11, 2004, CT of the abdomen and pelvis, impression:  Moderate     hydronephrosis and __________ involving the left renal transplant.  This     is most likely due to dilated urinary bladder which is in  turn due to     Foley catheter in abnormal position within the urethra.  No evidence of     pelvic mass or acute inflammatory process.  4. January 12, 2004, chest x-ray:  No active disease.  5. January 12, 2004, renal ultrasound, impression:  Small echogenic kidneys     bilaterally, septate cyst in the lower pole region of the left kidney.     Followup ultrasound in six months may be helpful to assess stability.   HISTORY OF PRESENT ILLNESS:  The patient is a 50 year old African American  male with end-stage renal disease, status post renal transplant which  failed, gout, hypertension, anemia, and recent diagnosis of MRSA pneumonia.  The patient presented to  hemodialysis on admission day for a third dose of  vancomycin.  At that time, the patient was noted to be febrile with  increased heart rate.  The patient reported sputum color more clear but  still experiencing dyspnea with exertion, generalized weakness.  The patient  also complains of urinary retention, and dysuria, along with painful swollen  left scrotum.  The patient denies chest pain.  Denies nausea, vomiting.  Denies melena, hematochezia.   HOSPITAL COURSE:  1. E. coli UTI, prostatitis, and epididymitis.  On admission, the patient     was started on IV Cipro with minimal effect.  Once his sensitivities     returned on urine culture, the patient was changed to Ancef which was a     sensitive agent.  The patient's urine was thick and milky.  A Foley was     placed and irrigation of the bladder was begun.  A CT of the abdomen and     pelvis was obtained to determine if the patient had an abscess or     infectious process in any of his kidneys including his transplant kidney.     There were no findings to support this by CT; however, it was noted that     Foley was in the urethra and causing bladder distention and     hydronephrosis of transplant kidney.  The Foley was re-positioned with     some difficulty and a coude catheter was  inserted with good return.  The     patient put out 1100 cc at that time.  Ultrasound followup showed     resolution of the hydronephrosis.  The patient's fevers and white blood     cell count trended down.  The patient's urine began to clear.  Scrotal     tenderness improved also.  The patient received nine doses of Ancef 1     gram IV while in-house.  The patient is to continue on Ancef 2 grams IV     at hemodialysis x 4 doses.  Foley catheter is to remain in place     secondary to hypotonic bladder.   1. Hypotonic bladder.  This had been previously diagnosed by Dr. Annabell Howells as an     outpatient.  While in-house a post-void residual was performed and the     patient was noted to have 254 cc.  Dr. Annabell Howells was consulted.  The patient     refused to do I and O, self-catheterization; therefore, the patient will     have Foley as an outpatient and will follow up with Dr. Annabell Howells, on Jan 24, 2004, next week to discuss different options.   1. MRSA pneumonia.  While in-house chest x-ray was performed which showed     resolution of previous infiltrate seen on December 27, 2003.  The patient was     continued on vancomycin to complete a two week course.   1. MRSA elbow infection.  The patient  has had previous infections to his     left elbow wound which is status post surgical procedure to left elbow     tophi.  The patient underwent hydrotherapy while in-house.  The wound was     noted to have a mild purulent discharge and was sent for culture.  MRSA     again grew from the wound.  The patient was started on vancomycin 1 gram     IV x 1, will continue on vancomycin  with outpatient hemodialysis x 5     doses at 500 mg.  The patient will also continue home care of wound and     home health RN will assist with wound care on off hemodialysis days.   1. Hypertension.  The patient was noted to have decreased blood pressure and    increased heart rate during his stay.  The patient had cardiac enzymes,      TSH, and spiral CT for previous tachycardia on admission.  These were all     negative.  The patient was noted to be orthostatic.  The patient was     given fluid resuscitation.  Decreased volume likely secondary to fevers     and poor p.o. intake.   1. End-stage renal disease.  The patient will continue on his hemodialysis,     Tuesday, Thursday, Saturday schedule as an outpatient.  No changes were     made to his hemodialysis orders.  The patient's estimated dry weight is     at 52 kilograms.   1. Anemia.  The patient will continue on Epogen and iron, stable while in-     house.   1. Gout.  The patient has a long history of severe tophaceous gout.  The     patient had acute flare while in hospital.  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  The patient will     continue on prednisone taper as outpatient along with indomethacin b.i.d.     p.r.n.  The patient was also given a prescription for oxycodone 5 mg, 1-2     tabs q.6h. p.r.n. pain.   LABS:  Left elbow culture, few methicillin-resistant Staphylococcus aureus.  On January 16, 2004, sodium 142, potassium 4.3, chloride 105, bicarb 20,  glucose 92, BUN 58, creatinine 9.5.  Albumin 2.0, calcium 9.1, phosphorous  7.9.  WBC 16.5, hemoglobin 11.6, hematocrit 36, platelets 372.  C. diff  toxin negative.  January 07, 2004, urine culture is greater than 100,000  colonies E. coli sensitive to cefazolin, ceftriaxone, nitrofurantoin,  tobramycin, and Bactrim.  TSH 0.841.  Urine culture, January 13, 2004, no  growth.      Stephen Harness, MD                    Terrial Rhodes, M.D.    Cleotis Lema  D:  01/17/2004  T:  01/18/2004  Job:  [id number]   cc:   [facility]  [address]   Excell Seltzer. Annabell Howells, M.D.  [address]  [place]  [place] [address]  Fax: [phone]

## 2011-02-07 NOTE — Consult Note (Signed)
NAMEBONNY, Mora                          ACCOUNT NO.:  [id number]   MEDICAL RECORD NO.:  [id number]                   PATIENT TYPE:  INP   LOCATION:  5532                                 FACILITY:  [facility]   PHYSICIAN:  Excell Seltzer. Annabell Howells, M.D.                 DATE OF BIRTH:  02-02-1961   DATE OF CONSULTATION:  01/08/2004  DATE OF DISCHARGE:                                   CONSULTATION   CHIEF COMPLAINT:  Left scrotal pain.   HISTORY:  [name] is a 50 year old black male with end-stage renal  disease, currently on dialysis with failed transplants, who I had seen in  the office recently for urinary retention.  He was found on cystoscopy to  have no evidence of bladder outlet obstruction and was felt to have a  hypotonic bladder.  He was started on bethanechol.  He is now admitted with  left scrotal pain, fever and dysuria and was found to have an E. coli  urinary tract infection.  Sensitivities are pending.   His past social and family history are well documented in the chart.   REVIEW OF SYSTEMS:  He is primarily complaining of the dysuria and left  scrotal pain, but is otherwise without complaint.   PHYSICAL EXAMINATION:  GENERAL:  He is a thin, ill-appearing black male in  no acute distress.  He is alert and oriented times three.  ABDOMEN:  Soft and flat with well healed scars.  The bladder is not  palpable.  There is slight tenderness in the suprapubic area.  GENITOURINARY:  He has an unremarkable phallus and adequate meatus.  The  scrotum is not too markedly swollen on the left.  There is tenderness and  induration of the left epididymis and testicle consistent with epididymo-  orchitis.  The right testicle and epididymis are unremarkable.   Urine was nitrate negative with large leukocyte esterase.  The urine culture  was noted to have greater than 10:50 E. coli.  Sensitivities are pending.  Admission CBC:  He had a white count of 16.1.  It is now down to 11.5 with  antibiotics.  He is now afebrile.   IMPRESSION:  1. Enterococcus coli urinary tract infection with left epididymitis.  2. Hypotonic bladder.  3. End-stage renal disease.   RECOMMENDATIONS:  1. We will get a bladder scan for residual urine and consider I&O catheter     if his residual is greater than 150 cc.  2. Continue Cipro pending his sensitivities.  3. Continue bethanechol.  4. Have him follow up with me in the office in one to two weeks.                                               Excell Seltzer. Annabell Howells,  M.D.   JJW/MEDQ  D:  01/08/2004  T:  01/09/2004  Job:  [id number]

## 2011-02-07 NOTE — Discharge Summary (Signed)
NAME:  [name], [name]                          ACCOUNT NO.:  [id number]   MEDICAL RECORD NO.:  [id number]                   PATIENT TYPE:  INP   LOCATION:  5705                                 FACILITY:  [facility]   PHYSICIAN:  Jimmye Norman III, M.D.               DATE OF BIRTH:  1960/12/20   DATE OF ADMISSION:  11/06/2003  DATE OF DISCHARGE:  11/12/2003                                 DISCHARGE SUMMARY   DISCHARGE DIAGNOSIS:  Status post colectomy and colostomy for diverticulitis  with perforation.   OTHER DIAGNOSES:  1. Renal failure.  2. Hypertension.   PRINCIPAL PROCEDURE:  Takedown of colostomy. This was done by Dr. Lindie Spruce on  the day of admission.   DISCHARGE MEDICATIONS:  Tylenol as needed for pain.   He is to follow up to see Dr. Lindie Spruce in two to three weeks. He was discharged  over the weekend by the [facility].   BRIEF SUMMARY OF HOSPITAL COURSE:  The patient was admitted the day of  surgery after a bowel prep as an outpatient. He underwent a takedown  colostomy on November 06, 2003. He did well and was eating by postoperative  day two, on liquids, and advanced diet up until his discharge on  postoperative day #6 at which time he was tolerating a diet well. His  abdomen was soft, nontender. His incision was clean and dry with no evidence  of infection. He is to follow up to see Dr. Lindie Spruce. During this  hospitalization he did receive dialysis per the renal service and he will  continue to do so as an outpatient.                                                Kathrin Ruddy, M.D.    JW/MEDQ  D:  12/27/2003  T:  12/27/2003  Job:  [id number]

## 2011-02-07 NOTE — H&P (Signed)
NAME:  [name], SCHUPP                          ACCOUNT NO.:  [id number]   MEDICAL RECORD NO.:  [id number]                   PATIENT TYPE:  INP   LOCATION:  2039                                 FACILITY:  [facility]   PHYSICIAN:  Terrial Rhodes, M.D.             DATE OF BIRTH:  1961-05-23   DATE OF ADMISSION:  01/06/2004  DATE OF DISCHARGE:                                HISTORY & PHYSICAL   HISTORY OF PRESENT ILLNESS:  The patient is a 50 year old African American  male with end-stage renal disease, status post renal transplant which  failed, gout, hypertension, anemia, status post perforated diverticulum with  colostomy and takedown.  The patient had recent diagnosis of pneumonia.  On  December 27, 2003, the patient presented to his regular hemodialysis with  complaints of cold, fever and chills.  Chest x-ray revealed left lower lobe  infiltrate and the patient was started on Avelox.  Sputum cultures revealed  MRSA, Avelox discontinued and started on vancomycin x6 doses with  hemodialysis.  Patient presented to hemodialysis for third dose of  vancomycin today.  At that time, the patient was noted to be febrile with  increased heart rate.  Patient reported sputum color more clear but still  experiencing dyspnea with exertion and generalized weakness.  Patient also  complains of urinary retention and dysuria, along with painful swollen left  scrotum.  Patient denies chest pain, denies nausea or vomiting, denies  melena or hematochezia.   PAST MEDICAL HISTORY:  1. End-stage renal disease secondary to hypertension.  2. Status post cadaver renal transplant in 1987, failed; hemodialysis     started August 2004.  3. Secondary hyperparathyroidism.  4. Hypertension.  5. Severe tophaceous gout with recent surgical procedure to left elbow     tophi.  6. 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  7. Anemia.  8. Perforated sigmoid diverticulum with colectomy and colostomy in August  2004, colostomy takedown in February 2005.  9. History of hemorrhoids.  10.      History of pancreatitis.  11.      GERD.  12.      Urinary retention.   HOME MEDICATIONS:  1  Bactroban.  1. Mucinex b.i.d.  2. Indocin SR b.i.d. p.r.n.  3. Calcium carbonate 1000 mg with meals.  4. Protonix nightly.  5. Prednisone 5 mg daily.  6. Nephro-Vite 1 tab daily.  7. Darvocet p.r.n.  8. Simethicone p.r.n.  9. Bethanechol 25 mg p.o. 4 times daily.   ALLERGIES:  ASPIRIN and ALLOPURINOL.   FAMILY MEDICAL HISTORY:  Mother deceased secondary to stomach carcinoma.  Father deceased in his 22s secondary to myocardial infarction.  One brother  with lung transplant.  One sister healthy.  No children.   SOCIAL HISTORY:  Patient lives in [place] with his sister.  He had quit  smoking 15 years ago, smoked approximately 1/2 pack per day x8 years.  The  patient has a history of heavy alcohol, quit 15 years ago, denies illicit  drugs or IV drugs, currently unemployed.   PHYSICAL EXAM:  VITAL SIGNS:  Temperature 100.1, blood pressure 102/56,  pulse 140, respirations 24.  GENERAL:  Thin, tired, in no apparent distress.  HEENT:  Pupils equal, round and reactive to light.  Extraocular movements  intact.  Oropharynx clear.  Poor dentition.  NECK:  No JVD.  No carotid bruits.  CARDIOVASCULAR:  Tachycardia, regular rhythm.  No murmurs appreciated.  LUNGS:  Lungs clear to auscultation bilaterally.  ABDOMEN:  Abdomen soft, nontender and nondistended.  Positive bowel sounds.  Surgical scars noted.  EXTREMITIES:  No cyanosis, clubbing or edema.  Good pulses.  NEUROLOGIC:  Alert, oriented x3.   LABORATORY AND ACCESSORY CLINICAL DATA:  EKG:  Sinus rhythm at 133 beats per  minute, LVH.   Chest x-ray:  Improved aeration of left lower lobe, resolving infiltrate.   WBC 24.9, hemoglobin 13.9, platelets 309,000.  PCO2 38, PO2 87, pH 7.54,  bicarb 32, 97% on room air.   ASSESSMENT AND PLAN:  [age]-year-old  African American male with:  1. Left lower lobe pneumonia.  Sputum culture reveals MRSA.  The patient has     received 3 doses of vancomycin with hemodialysis.  Chest x-ray shows     improvement.  Patient reports mild clinical improvement but still with     shortness of breath on exertion.  Continue vancomycin and check blood     cultures.  Patient currently saturating while on room air.  Will consider     broadening antibiotic coverage.  If patient continues to spike fevers, we     will follow cultures.  2. End-stage renal disease, on hemodialysis as of August 2004 after renal     transplant failed.  Continue Tuesday/Thursday/Saturday schedule.  3. Tachycardia possibly secondary to resolving pneumonia.  Will check     cardiac enzymes x3 and TSH.  Patient appears to be dehydrated.  We will     rehydrate with 250-mL bolus and then 50 mL normal saline per hour x5     hours.  Will follow on telemetry.  4. Hypertension.  Currently BP on low side, will follow.  With tachycardia     and mild fevers, we will also check spiral CT to rule out pulmonary     embolus.  5. Fevers persisting, likely secondary to pneumonia.  Blood cultures     pending.  With history of urinary retention, we will check UA with     microscopy and culture.  In-and-out catheterization as needed.  6. Gout, currently in remission.  Continue dressing changes to left elbow.     Wound care consult.  7. Anemia within normal limits, on EPO with iron with hemodialysis.  8. Secondary hyperparathyroidism, on calcium carbonate.      Delbert Harness, MD                    Terrial Rhodes, M.D.    Cleotis Lema  D:  01/08/2004  T:  01/09/2004  Job:  [id number]

## 2011-02-07 NOTE — Op Note (Signed)
[name], TOMB NO.:  [id number]   MEDICAL RECORD NO.:  [id number]          PATIENT TYPE:  OIB   LOCATION:  5703                         FACILITY:  [facility]   PHYSICIAN:  Janetta Hora. Fields, MD  DATE OF BIRTH:  April 13, 1961   DATE OF PROCEDURE:  11/21/2004  DATE OF DISCHARGE:                                 OPERATIVE REPORT   PROCEDURE:  Thrombectomy and revision of left brachial cephalic AV fistula.   PREOPERATIVE DIAGNOSIS:  Thrombosed AV fistula.   POSTOPERATIVE DIAGNOSIS:  Thrombosed AV fistula.   ANESTHESIA:  Local with IV sedation.   ASSISTANT:  Nurse.   INDICATIONS:  The patient is a 50 year old male with a left brachial  cephalic AV fistula. This was placed approximately 18 months ago. He now  presents with occlusion of the fistula. The fascia was then used for  approximately one year.   OPERATIVE FINDINGS:  1.  Severe intimal hyperplastic narrowing of proximal 2 cm adjacent to the      arterial anastomosis.  2.  A 6 mm interposition graft revision.   OPERATIVE DETAIL:  After obtaining informed consent, the patient in the  operating. Patient placed in supine position on the operating table. After  adequate sedation, the patient's entire left upper extremity was prepped and  draped usual sterile fashion. Local anesthesia was infiltrated at a  preexisting antecubital crease incision. This incision was reopened, carried  down through the subcutaneous tissues and the fistula was dissected free  circumferentially. The brachial artery was then dissected free  circumferentially and controlled proximally and distally with Vesi-loops.  The patient was then given 5000 units of intravenous heparin.  A #11 blade  was used to create a small arteriotomy within the fistula. A #3 Fogarty  catheter was then passed distally up through the vein. Multiple passes were  made with return of some thrombotic material and some backbleeding. The vein  was then probed  and was found to only accept a 2 dilator. Therefore, the  vein was opened longitudinally and there was severe in line for plastic  thickening. The vein was opened with Potts scissors up to the level that was  not thickened and would accept up to a 4 mm dilator. The thickened portion  of the vein was debrided away. Next, attention was turned toward the  proximal half of fistula. This also had severe intimal hyperplastic  narrowing. This process was used to debride this away and the vein was  basically on nonsalvageable up to a level of the arterial anastomosis. All  this portion of proximal vein was completed debrided. The arteriotomy was  then freshened up with scissors. A 6 mm PTFE graft was then brought to the  operative field and beveled. This was then sewn end-to-end to the cephalic  vein using a running 6-0 Prolene suture. At completion anastomosis, this was  thoroughly flushed with heparinized saline. Next, the graft was beveled on  the other and sewn end-to-end to the brachial artery using running 6-0  Prolene suture. Just prior to completion of the anastomosis, this was  forebled,  backbled and thoroughly flushed. Clamps were released and  anastomosis was secured,  hemostasis was obtained with two additional repair  stitches. There is a palpable pulse and weak thrill within the fistula after  releasing the clamps. The patient was given 30 mg of protamine to assist in  hemostasis. After this had been administered, the subcutaneous tissues were  reapproximated  using running 3-0 Vicryl suture. The skin was closed with a 4-0 Vicryl  subcuticular stitch. The patient tolerated procedure well. There were no  complications. Radial arterial signal was inspected with Doppler at  the end  of the case and found to be present.      CEF/MEDQ  D:  11/21/2004  T:  11/22/2004  Job:  [id number]

## 2011-02-07 NOTE — H&P (Signed)
NAME:  [name], [name]                          ACCOUNT NO.:  [id number]   MEDICAL RECORD NO.:  [id number]                   PATIENT TYPE:  INP   LOCATION:  1825                                 FACILITY:  [facility]   PHYSICIAN:  Aram Beecham B. Eliott Nine, M.D.             DATE OF BIRTH:  31-Oct-1960   DATE OF ADMISSION:  05/06/2003  DATE OF DISCHARGE:                                HISTORY & PHYSICAL   HISTORY OF PRESENT ILLNESS:  This is a 50 year old black male with  progressive renal insufficiency secondary to a failing renal transplant  (recent BUN and creatinine 140-170/7-7.5), hypertension, and severe gout.  He presents with 48 hours of progressively worsening abdominal pain and  distention.   The patient was in his usual state of health, relatively free of symptoms  other than fatigue, until May 04, 2003, about 5:30 a.m. when he developed  problems with orthostatic dizziness.  He said he felt like he was going to  pass out when he either sat up or stood up.  He also had a low-grade  temperature of 99 degrees.  He was instructed to stop taking Lasix and  Zaroxolyn.  Throughout the day, he continued to have symptoms and then  developed sharp bilateral lower quadrant abdominal pain that initially came  and went and then became persistent with any type of movement.  He also had  increasing abdominal distention.  He developed nausea without vomiting and  stools like one or two little balls.  Because this progressively worsened,  he could not eat, drink, or take his medications and he came to the  emergency room for evaluation.   Over the past several months, he has had a progressive worsening in his  renal function.  He was seen by Dr. Hyman Hopes on April 17, 2003, who wanted to  start him on dialysis at that point.  He wanted to discuss this with Dr.  Darrick Penna and an appointment was arranged for May 08, 2003.  His  laboratory studies on April 13, 2003, showed a BUN of 144, creatinine 7.5,  calcium 9.7, and phosphorus 10.1.  He had recently been taken off calcitriol  because of hypercalcemia and PhosLo had been stopped as well.  He underwent  placement of a primary AV fistula on April 27, 2003, in anticipation of  needing dialysis in the near future.  He specifically he has had no problems  up until this past Thursday with anorexia, nausea, vomiting, itching, sleep  disturbance, etc., but has had pretty significant fatigue.   PAST MEDICAL HISTORY:  1. 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     GFR currently less than 10.  Creatinine 7.5.  He has a nonmature AV     fistula placed on April 27, 2003, and no other permanent access.  2. 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  Only on Probenecid.  3. Hypertension.  4. Hyperparathyroidism.  5. Anemia on Aranesp.   CURRENT MEDICATIONS:  (He has had no medicines at all today.)  1. Sandimmune 450 mg a day.  2. Bicitra two tablespoonsful t.i.d.  3. Probenecid 3 g a day.  4. Prinivil 40 mg a day.  5. Atenolol 100 mg b.i.d.  6. Norvasc 10 mg b.i.d.  7. Prednisone 15 mg a day.  8. Aranesp 60 mg once a month.  9. Pepcid AC.   ALLERGIES:  He has pancytopenia with ALLOPURINOL and is intolerant to  ASPIRIN.   FAMILY HISTORY:  Noncontributory.   REVIEW OF SYSTEMS:  Positive for orthostatic dizziness, shortness of breath,  cough, nausea, abdominal pain, abdominal distention, anorexia, and ankle  swelling.   PHYSICAL EXAMINATION:  GENERAL APPEARANCE:  He is a very thin black male who  has his knees drawn up to his chest in pain.  VITAL SIGNS:  Blood pressure 149/76 supine, heart rate in the 60s.  NECK:  No JVD in the supine position.  LUNGS:  Breath sounds were diminished at both bases.  HEART:  He had a very dynamic precordium inferiorly and laterally displaced  with positive S4, S1, and S2.  No pericardial friction rub.  ABDOMEN:  The lower abdomen was  distended.  There were bowel sounds present  in the left upper quadrant.  He had very marked tenderness to palpation in  both lower quadrants, right greater than left, with rebound tenderness  noted.  EXTREMITIES:  There was no pretibial edema, but he did have trace ankle  edema.  His AV fistula was patent.  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   LABORATORY DATA:  WBC 16,000, hemoglobin 11.3, platelets 254.  Sodium 138,  potassium 3.5, chloride 89, CO2 30, BUN 170, creatinine 8.1, albumin 2.4,  bilirubin 0.6.  Urinalysis negative, except for protein.  CT of the abdomen  showed inflammatory changes and bowel wall thickening in the ascending  colon, transverse colon, right lower quadrant, and small bowel loops.  Possible gas was noted in the gallbladder.  There was a mass in his left  renal transplant.  There was evidence for small bowel obstruction in the  transition zone, mid small bowel, and in the right lower quadrant.  There  was also left lower lobe airspace disease.   IMPRESSION:  A 49 year old black male with failing renal transplant, who  presents with:  1. Abdominal pain and distention with CT changes consistent with possible     bowel ischemia following what may have been a hypotensive episode at     home, although this is not documented.  The abdomen is exquisitely tender     with rebound.  We need surgery input ASPA.  2. Renal failure.  For all intents and purposes, he is at end-stage renal     disease and needs to start dialysis.  At the present time, he does not     have a patent permanent access that could be used.  3. Anemia on Aranesp.  4. Severe tophaceous gout on Probenecid.  5. Hypertension with question of hypotensive episode 48 hours prior to     admission.  6. Left lower lobe airspace disease by CT, rule out left lower lobe     pneumonia. 7. Mass in his renal transplant.    PLAN:  1. NPO.  2. Blood cultures  x 2.  3. Empiric Unasyn.  4. General surgery consult (Dr. Lindie Spruce has been called).  5. Depending on the surgery planned, if he goes to surgery in the next 24     hours, will ask for a Flexicon catheter to be placed by anesthesia so     that we can initiate dialysis.  If they chose to observe him clinically,     then will try to arrange Perm-Cath placement in the next 48 hours for     hemodialysis.  6. Stop Probenecid.  7. Stop Shohl's solution.  Can give slow IV fluids with sodium bicarbonate.  8. Since blood pressure medications will have to be on hold, will use a     Catapres patch and IV labetalol p.r.n.  9. Change prednisone to IV Solu-Medrol.  10.      Hold cyclosporin.  11.      Once bowel issues are addressed, will have to deal with the issue     of the mass in his renal allograft with MRI.  I suspect he will require     transplant nephrectomy.  If he has exploratory laparotomy in the next 24-     48 hours, the mass could be biopsied, but a transplant nephrectomy would     be a much bigger surgery and not pursued at the same sitting.                                                Duke Salvia Eliott Nine, M.D.    CBD/MEDQ  D:  05/06/2003  T:  05/06/2003  Job:  [id number]

## 2011-02-07 NOTE — Procedures (Signed)
[place]. [facility]  Patient:    [name], [name]                      MRN: [id number] Adm. Date:  [id number] Attending:  Delano Metz D                           Procedure Report  PREOPERATIVE DIAGNOSES:  Thrombocytopenia, anemia.  POSTOPERATIVE DIAGNOSES:  Thrombocytopenia, anemia.  PROCEDURE:  Bone marrow aspiration.  SURGEON:  Dolan Amen, M.D.  DESCRIPTION:  Good aspirate.  DESCRIPTION OF PROCEDURE:  Sterile technique.  Left posterior superior iliac spine, IV Versed and local lidocaine anesthesia.  Specimen:  Good bone marrow aspirate, submitted to the lab. DD:  09/09/00 TD:  09/09/00 Job: [id number]

## 2011-02-12 ENCOUNTER — Other Ambulatory Visit (HOSPITAL_COMMUNITY): Payer: Self-pay | Admitting: Nephrology

## 2011-02-12 ENCOUNTER — Ambulatory Visit (HOSPITAL_COMMUNITY)
Admission: RE | Admit: 2011-02-12 | Discharge: 2011-02-12 | Disposition: A | Discharge status: Home / Self Care | Payer: Medicare Other | Source: Ambulatory Visit | Attending: Nephrology | Admitting: Nephrology

## 2011-02-12 DIAGNOSIS — N186 End stage renal disease: Secondary | ICD-10-CM | POA: Insufficient documentation

## 2011-02-12 DIAGNOSIS — Y832 Surgical operation with anastomosis, bypass or graft as the cause of abnormal reaction of the patient, or of later complication, without mention of misadventure at the time of the procedure: Secondary | ICD-10-CM | POA: Insufficient documentation

## 2011-02-12 DIAGNOSIS — T8249XA Other complication of vascular dialysis catheter, initial encounter: Secondary | ICD-10-CM

## 2011-02-12 DIAGNOSIS — T82898A Other specified complication of vascular prosthetic devices, implants and grafts, initial encounter: Secondary | ICD-10-CM | POA: Insufficient documentation

## 2011-02-12 MED ORDER — IOHEXOL 300 MG/ML  SOLN: 100.0000 mL | Freq: Once | INTRAMUSCULAR | Status: AC | PRN

## 2011-02-12 MED ORDER — IOHEXOL 300 MG/ML  SOLN
100.0000 mL | Freq: Once | INTRAMUSCULAR | Status: AC | PRN
  Administered 2011-02-12: 60 mL via INTRAVENOUS

## 2011-03-10 ENCOUNTER — Ambulatory Visit (HOSPITAL_COMMUNITY)
Admission: RE | Admit: 2011-03-10 | Discharge: 2011-03-10 | Disposition: A | Discharge status: Home / Self Care | Payer: Medicare Other | Source: Ambulatory Visit | Attending: Nephrology | Admitting: Nephrology

## 2011-03-10 ENCOUNTER — Other Ambulatory Visit (HOSPITAL_COMMUNITY): Payer: Self-pay | Admitting: Nephrology

## 2011-03-10 DIAGNOSIS — Y832 Surgical operation with anastomosis, bypass or graft as the cause of abnormal reaction of the patient, or of later complication, without mention of misadventure at the time of the procedure: Secondary | ICD-10-CM | POA: Insufficient documentation

## 2011-03-10 DIAGNOSIS — D649 Anemia, unspecified: Secondary | ICD-10-CM | POA: Insufficient documentation

## 2011-03-10 DIAGNOSIS — K219 Gastro-esophageal reflux disease without esophagitis: Secondary | ICD-10-CM | POA: Insufficient documentation

## 2011-03-10 DIAGNOSIS — N186 End stage renal disease: Secondary | ICD-10-CM

## 2011-03-10 DIAGNOSIS — M109 Gout, unspecified: Secondary | ICD-10-CM | POA: Insufficient documentation

## 2011-03-10 DIAGNOSIS — Z01812 Encounter for preprocedural laboratory examination: Secondary | ICD-10-CM | POA: Insufficient documentation

## 2011-03-10 DIAGNOSIS — N2581 Secondary hyperparathyroidism of renal origin: Secondary | ICD-10-CM | POA: Insufficient documentation

## 2011-03-10 DIAGNOSIS — I120 Hypertensive chronic kidney disease with stage 5 chronic kidney disease or end stage renal disease: Secondary | ICD-10-CM | POA: Insufficient documentation

## 2011-03-10 DIAGNOSIS — B1920 Unspecified viral hepatitis C without hepatic coma: Secondary | ICD-10-CM | POA: Insufficient documentation

## 2011-03-10 DIAGNOSIS — T82898A Other specified complication of vascular prosthetic devices, implants and grafts, initial encounter: Secondary | ICD-10-CM | POA: Insufficient documentation

## 2011-03-10 LAB — POCT I-STAT 4, (NA,K, GLUC, HGB,HCT)
HCT: 33 % — ABNORMAL LOW (ref 39.0–52.0)
Sodium: 138 mEq/L (ref 135–145)

## 2011-03-10 MED ORDER — IOHEXOL 300 MG/ML  SOLN: 100.0000 mL | Freq: Once | INTRAMUSCULAR | Status: AC | PRN

## 2011-04-07 ENCOUNTER — Ambulatory Visit (HOSPITAL_COMMUNITY)
Admission: RE | Admit: 2011-04-07 | Discharge: 2011-04-07 | Disposition: A | Discharge status: Home / Self Care | Payer: Medicare Other | Source: Ambulatory Visit | Attending: Vascular Surgery | Admitting: Vascular Surgery

## 2011-04-07 ENCOUNTER — Ambulatory Visit (HOSPITAL_COMMUNITY): Payer: Medicare Other

## 2011-04-07 DIAGNOSIS — Z992 Dependence on renal dialysis: Secondary | ICD-10-CM | POA: Insufficient documentation

## 2011-04-07 DIAGNOSIS — I120 Hypertensive chronic kidney disease with stage 5 chronic kidney disease or end stage renal disease: Secondary | ICD-10-CM | POA: Insufficient documentation

## 2011-04-07 DIAGNOSIS — N186 End stage renal disease: Secondary | ICD-10-CM | POA: Insufficient documentation

## 2011-04-07 DIAGNOSIS — I748 Embolism and thrombosis of other arteries: Secondary | ICD-10-CM | POA: Insufficient documentation

## 2011-04-07 DIAGNOSIS — IMO0002 Reserved for concepts with insufficient information to code with codable children: Secondary | ICD-10-CM | POA: Insufficient documentation

## 2011-04-07 DIAGNOSIS — Z5309 Procedure and treatment not carried out because of other contraindication: Secondary | ICD-10-CM | POA: Insufficient documentation

## 2011-04-07 DIAGNOSIS — Z79899 Other long term (current) drug therapy: Secondary | ICD-10-CM | POA: Insufficient documentation

## 2011-04-07 LAB — POCT I-STAT 4, (NA,K, GLUC, HGB,HCT)
HCT: 46 % (ref 39.0–52.0)
Hemoglobin: 15.6 g/dL (ref 13.0–17.0)
Potassium: 4.4 mEq/L (ref 3.5–5.1)
Sodium: 137 mEq/L (ref 135–145)

## 2011-04-07 LAB — SURGICAL PCR SCREEN: Staphylococcus aureus: NEGATIVE

## 2011-04-22 ENCOUNTER — Other Ambulatory Visit (HOSPITAL_COMMUNITY): Payer: Self-pay | Admitting: Nephrology

## 2011-04-22 DIAGNOSIS — N186 End stage renal disease: Secondary | ICD-10-CM

## 2011-04-29 ENCOUNTER — Ambulatory Visit (HOSPITAL_COMMUNITY): Admission: RE | Admit: 2011-04-29 | Payer: Medicare Other | Source: Ambulatory Visit

## 2011-04-29 ENCOUNTER — Ambulatory Visit (HOSPITAL_COMMUNITY)
Admission: RE | Admit: 2011-04-29 | Discharge: 2011-04-29 | Disposition: A | Discharge status: Home / Self Care | Payer: Medicare Other | Source: Ambulatory Visit | Attending: Nephrology | Admitting: Nephrology

## 2011-04-29 ENCOUNTER — Other Ambulatory Visit (HOSPITAL_COMMUNITY): Payer: Self-pay | Admitting: Nephrology

## 2011-04-29 DIAGNOSIS — T82898A Other specified complication of vascular prosthetic devices, implants and grafts, initial encounter: Secondary | ICD-10-CM | POA: Insufficient documentation

## 2011-04-29 DIAGNOSIS — K219 Gastro-esophageal reflux disease without esophagitis: Secondary | ICD-10-CM | POA: Insufficient documentation

## 2011-04-29 DIAGNOSIS — Y832 Surgical operation with anastomosis, bypass or graft as the cause of abnormal reaction of the patient, or of later complication, without mention of misadventure at the time of the procedure: Secondary | ICD-10-CM | POA: Insufficient documentation

## 2011-04-29 DIAGNOSIS — D649 Anemia, unspecified: Secondary | ICD-10-CM | POA: Insufficient documentation

## 2011-04-29 DIAGNOSIS — N186 End stage renal disease: Secondary | ICD-10-CM | POA: Insufficient documentation

## 2011-04-29 DIAGNOSIS — I120 Hypertensive chronic kidney disease with stage 5 chronic kidney disease or end stage renal disease: Secondary | ICD-10-CM | POA: Insufficient documentation

## 2011-04-29 MED ORDER — IOHEXOL 300 MG/ML  SOLN: 100.0000 mL | Freq: Once | INTRAMUSCULAR | Status: AC | PRN

## 2011-04-29 MED ORDER — IOHEXOL 300 MG/ML  SOLN
100.0000 mL | Freq: Once | INTRAMUSCULAR | Status: AC | PRN
  Administered 2011-04-29: 80 mL via INTRAVENOUS

## 2011-05-02 ENCOUNTER — Ambulatory Visit: Payer: Medicare Other | Admitting: Vascular Surgery

## 2011-05-07 ENCOUNTER — Ambulatory Visit: Payer: Self-pay | Admitting: Vascular Surgery

## 2011-05-21 ENCOUNTER — Ambulatory Visit: Payer: Self-pay | Admitting: Vascular Surgery

## 2011-05-22 NOTE — Op Note (Signed)
NAMEGORJE, IYER NO.:  [id number]  MEDICAL RECORD NO.:  [id number]  LOCATION:  SDSC                         FACILITY:  [facility]  PHYSICIAN:  Janetta Hora. Fields, MD  DATE OF BIRTH:  1961/01/15  DATE OF PROCEDURE:  04/07/2011 DATE OF DISCHARGE:  04/07/2011                              OPERATIVE REPORT   PROCEDURE: 1. Attempted left internal jugular vein Diatek catheter. 2. Placement of right femoral Diatek catheter.  PREOPERATIVE DIAGNOSIS:  End-stage renal disease.  POSTOPERATIVE DIAGNOSIS:  End-stage renal disease.  ANESTHESIA:  Local with IV sedation.  OPERATIVE FINDINGS: 1. Left innominate vein occlusion. 2. A 55-cm Diatek catheter right femoral vein.  OPERATIVE DETAILS:  After obtaining informed consent, the patient was taken to the operating room.  The patient was placed in supine position on operating table.  After adequate sedation, the patient's entire neck and chest were prepped and draped in usual sterile fashion.  Ultrasound was used to identify the right internal jugular vein.  This had a chronic occlusion.  The left internal jugular vein was identified.  This had normal compressibility but did not have very much respiratory variation.  The patient does have a history of a known left innominate stenosis from several years ago.  At this point, it was decided to try to place a Diatek catheter on the left side since the left internal jugular vein was opened.  Local anesthesia was infiltrated over the left internal jugular vein and an introducer needle was used to cannulate the left internal jugular vein and a 0.035 J-tip guidewire was threaded into the left internal jugular vein and the course of the wire then went into the area of what appeared to be the azygos system.  At this point, the needle was pulled back over the guidewire and a 5-French H1 catheter placed over the guidewire into the left jugular vein and a contrast angiogram was  performed.  This showed that the azygos vein was filling, the innominate vein was occluded, and the drainage of the left internal jugular vein was primarily into the azygos system.  At this point, the H1 catheter was removed and hemostasis obtained with direct pressure.  Attention was then turned to the femoral region.  Both groins were prepped and draped in usual sterile fashion.  Local anesthesia was infiltrated over the right femoral vein.  An introducer needle was used to cannulate the right femoral vein and a 0.035 J-tip guidewire threaded through the right femoral vein up into the right atrium under fluoroscopic guidance.  Next, sequential 12, 14 and 16-French dilators with peel-away sheath placed over the guidewire in the right femoral vein.  A 55-cm Diatek catheter was placed through the peel-away sheath up to the level of the right atrium.  The peel-away sheath was then removed.  Catheter was tunneled subcutaneously, cut to length and a hub attached.  Catheter was noted to flush and draw easily.  The catheter was secured to the skin with nylon sutures.  Catheter was loaded with concentrated heparin solution.  The needle insertion site in the right groin was closed with Vicryl stitch.  The patient tolerated the procedure well and there were  no complications.  Instrument, sponge and needle counts were correct at the end of the case.  The patient was taken to the recovery room in stable condition.  The patient will be scheduled to have a left thigh graft placed in the near future since he now has documented known occlusion of the right innominate vein, as well as the left innominate vein and would not be a candidate for further upper extremity grafts or fistulae.     Janetta Hora. Fields, MD     CEF/MEDQ  D:  04/07/2011  T:  04/07/2011  Job:  [id number]  cc:   [facility]  Electronically Signed by Fabienne Bruns MD on 05/22/2011 06:26:13 PM

## 2011-05-30 ENCOUNTER — Ambulatory Visit: Payer: Self-pay | Admitting: Vascular Surgery

## 2011-07-07 ENCOUNTER — Other Ambulatory Visit (HOSPITAL_COMMUNITY): Payer: Self-pay | Admitting: Nephrology

## 2011-07-07 DIAGNOSIS — N186 End stage renal disease: Secondary | ICD-10-CM

## 2011-07-21 ENCOUNTER — Ambulatory Visit (HOSPITAL_COMMUNITY): Payer: Medicare Other

## 2011-07-25 ENCOUNTER — Ambulatory Visit (HOSPITAL_COMMUNITY)
Admission: RE | Admit: 2011-07-25 | Discharge: 2011-07-25 | Disposition: A | Discharge status: Home / Self Care | Payer: Medicare Other | Source: Ambulatory Visit | Attending: Nephrology | Admitting: Nephrology

## 2011-07-25 DIAGNOSIS — N2581 Secondary hyperparathyroidism of renal origin: Secondary | ICD-10-CM | POA: Insufficient documentation

## 2011-07-25 DIAGNOSIS — K2100 Gastro-esophageal reflux disease with esophagitis, without bleeding: Secondary | ICD-10-CM | POA: Insufficient documentation

## 2011-07-25 DIAGNOSIS — M1A00X1 Idiopathic chronic gout, unspecified site, with tophus (tophi): Secondary | ICD-10-CM | POA: Insufficient documentation

## 2011-07-25 DIAGNOSIS — N186 End stage renal disease: Secondary | ICD-10-CM | POA: Insufficient documentation

## 2011-07-25 DIAGNOSIS — I120 Hypertensive chronic kidney disease with stage 5 chronic kidney disease or end stage renal disease: Secondary | ICD-10-CM | POA: Insufficient documentation

## 2011-07-25 DIAGNOSIS — Z4901 Encounter for fitting and adjustment of extracorporeal dialysis catheter: Secondary | ICD-10-CM | POA: Insufficient documentation

## 2011-07-25 DIAGNOSIS — D649 Anemia, unspecified: Secondary | ICD-10-CM | POA: Insufficient documentation

## 2011-07-25 DIAGNOSIS — G5600 Carpal tunnel syndrome, unspecified upper limb: Secondary | ICD-10-CM | POA: Insufficient documentation

## 2011-11-03 ENCOUNTER — Other Ambulatory Visit (HOSPITAL_COMMUNITY): Payer: Self-pay | Admitting: Nephrology

## 2011-11-03 DIAGNOSIS — N186 End stage renal disease: Secondary | ICD-10-CM

## 2011-11-06 ENCOUNTER — Other Ambulatory Visit (HOSPITAL_COMMUNITY): Payer: Self-pay | Admitting: Nephrology

## 2011-11-06 DIAGNOSIS — N186 End stage renal disease: Secondary | ICD-10-CM

## 2011-11-07 ENCOUNTER — Ambulatory Visit (HOSPITAL_COMMUNITY)
Admission: RE | Admit: 2011-11-07 | Discharge: 2011-11-07 | Disposition: A | Discharge status: Home / Self Care | Payer: Medicare Other | Source: Ambulatory Visit | Attending: Nephrology | Admitting: Nephrology

## 2011-11-07 ENCOUNTER — Ambulatory Visit (HOSPITAL_COMMUNITY): Admission: RE | Admit: 2011-11-07 | Payer: Medicare Other | Source: Ambulatory Visit

## 2011-11-07 DIAGNOSIS — Z0389 Encounter for observation for other suspected diseases and conditions ruled out: Secondary | ICD-10-CM | POA: Insufficient documentation

## 2011-11-07 DIAGNOSIS — N186 End stage renal disease: Secondary | ICD-10-CM

## 2011-11-07 MED ORDER — IOHEXOL 300 MG/ML  SOLN
100.0000 mL | Freq: Once | INTRAMUSCULAR | Status: AC | PRN
  Administered 2011-11-07: 32 mL via INTRAVENOUS

## 2011-11-07 NOTE — Procedures (Signed)
Left thigh dialysis graft shuntogram demonstrates normal functioning.  No intervention performed.  No immediate complications.

## 2011-11-11 ENCOUNTER — Telehealth (HOSPITAL_COMMUNITY): Payer: Self-pay

## 2011-11-14 ENCOUNTER — Ambulatory Visit: Payer: Self-pay | Admitting: Vascular Surgery

## 2012-02-18 ENCOUNTER — Ambulatory Visit: Payer: Self-pay | Admitting: Vascular Surgery

## 2012-04-02 ENCOUNTER — Encounter (HOSPITAL_COMMUNITY): Payer: Self-pay | Admitting: Emergency Medicine

## 2012-04-02 ENCOUNTER — Inpatient Hospital Stay (HOSPITAL_COMMUNITY)
Admission: EM | Admit: 2012-04-02 | Discharge: 2012-06-05 | DRG: 329 | Disposition: A | Discharge status: Skilled Nursing Facility | Payer: Medicare Other | Attending: Internal Medicine | Admitting: Internal Medicine

## 2012-04-02 DIAGNOSIS — N186 End stage renal disease: Secondary | ICD-10-CM | POA: Diagnosis present

## 2012-04-02 DIAGNOSIS — R64 Cachexia: Secondary | ICD-10-CM | POA: Diagnosis present

## 2012-04-02 DIAGNOSIS — T8119XA Other postprocedural shock, initial encounter: Secondary | ICD-10-CM | POA: Diagnosis not present

## 2012-04-02 DIAGNOSIS — K567 Ileus, unspecified: Secondary | ICD-10-CM | POA: Diagnosis not present

## 2012-04-02 DIAGNOSIS — R58 Hemorrhage, not elsewhere classified: Secondary | ICD-10-CM | POA: Diagnosis not present

## 2012-04-02 DIAGNOSIS — D751 Secondary polycythemia: Secondary | ICD-10-CM | POA: Diagnosis present

## 2012-04-02 DIAGNOSIS — K651 Peritoneal abscess: Secondary | ICD-10-CM | POA: Diagnosis not present

## 2012-04-02 DIAGNOSIS — K560 Paralytic ileus: Secondary | ICD-10-CM | POA: Diagnosis not present

## 2012-04-02 DIAGNOSIS — Z992 Dependence on renal dialysis: Secondary | ICD-10-CM

## 2012-04-02 DIAGNOSIS — D631 Anemia in chronic kidney disease: Secondary | ICD-10-CM | POA: Diagnosis present

## 2012-04-02 DIAGNOSIS — K658 Other peritonitis: Secondary | ICD-10-CM | POA: Diagnosis not present

## 2012-04-02 DIAGNOSIS — Z886 Allergy status to analgesic agent status: Secondary | ICD-10-CM

## 2012-04-02 DIAGNOSIS — E871 Hypo-osmolality and hyponatremia: Secondary | ICD-10-CM | POA: Diagnosis not present

## 2012-04-02 DIAGNOSIS — Z23 Encounter for immunization: Secondary | ICD-10-CM

## 2012-04-02 DIAGNOSIS — IMO0002 Reserved for concepts with insufficient information to code with codable children: Secondary | ICD-10-CM

## 2012-04-02 DIAGNOSIS — Y836 Removal of other organ (partial) (total) as the cause of abnormal reaction of the patient, or of later complication, without mention of misadventure at the time of the procedure: Secondary | ICD-10-CM | POA: Diagnosis not present

## 2012-04-02 DIAGNOSIS — Z681 Body mass index (BMI) 19 or less, adult: Secondary | ICD-10-CM

## 2012-04-02 DIAGNOSIS — B1920 Unspecified viral hepatitis C without hepatic coma: Secondary | ICD-10-CM | POA: Diagnosis present

## 2012-04-02 DIAGNOSIS — D696 Thrombocytopenia, unspecified: Secondary | ICD-10-CM | POA: Diagnosis present

## 2012-04-02 DIAGNOSIS — K632 Fistula of intestine: Secondary | ICD-10-CM | POA: Diagnosis not present

## 2012-04-02 DIAGNOSIS — N2581 Secondary hyperparathyroidism of renal origin: Secondary | ICD-10-CM | POA: Diagnosis present

## 2012-04-02 DIAGNOSIS — Z7952 Long term (current) use of systemic steroids: Secondary | ICD-10-CM

## 2012-04-02 DIAGNOSIS — T8140XA Infection following a procedure, unspecified, initial encounter: Secondary | ICD-10-CM | POA: Diagnosis not present

## 2012-04-02 DIAGNOSIS — Z940 Kidney transplant status: Secondary | ICD-10-CM

## 2012-04-02 DIAGNOSIS — D62 Acute posthemorrhagic anemia: Secondary | ICD-10-CM | POA: Diagnosis not present

## 2012-04-02 DIAGNOSIS — D649 Anemia, unspecified: Secondary | ICD-10-CM

## 2012-04-02 DIAGNOSIS — J9601 Acute respiratory failure with hypoxia: Secondary | ICD-10-CM | POA: Diagnosis not present

## 2012-04-02 DIAGNOSIS — E876 Hypokalemia: Secondary | ICD-10-CM | POA: Diagnosis not present

## 2012-04-02 DIAGNOSIS — I4729 Other ventricular tachycardia: Secondary | ICD-10-CM | POA: Diagnosis not present

## 2012-04-02 DIAGNOSIS — E875 Hyperkalemia: Secondary | ICD-10-CM | POA: Diagnosis not present

## 2012-04-02 DIAGNOSIS — I82B19 Acute embolism and thrombosis of unspecified subclavian vein: Secondary | ICD-10-CM | POA: Diagnosis not present

## 2012-04-02 DIAGNOSIS — J95821 Acute postprocedural respiratory failure: Secondary | ICD-10-CM | POA: Diagnosis not present

## 2012-04-02 DIAGNOSIS — E43 Unspecified severe protein-calorie malnutrition: Secondary | ICD-10-CM | POA: Diagnosis present

## 2012-04-02 DIAGNOSIS — K5641 Fecal impaction: Secondary | ICD-10-CM | POA: Diagnosis not present

## 2012-04-02 DIAGNOSIS — I472 Ventricular tachycardia, unspecified: Secondary | ICD-10-CM | POA: Diagnosis not present

## 2012-04-02 DIAGNOSIS — E46 Unspecified protein-calorie malnutrition: Secondary | ICD-10-CM | POA: Diagnosis present

## 2012-04-02 DIAGNOSIS — K56609 Unspecified intestinal obstruction, unspecified as to partial versus complete obstruction: Secondary | ICD-10-CM | POA: Diagnosis present

## 2012-04-02 DIAGNOSIS — Z8249 Family history of ischemic heart disease and other diseases of the circulatory system: Secondary | ICD-10-CM

## 2012-04-02 DIAGNOSIS — E162 Hypoglycemia, unspecified: Secondary | ICD-10-CM | POA: Diagnosis not present

## 2012-04-02 DIAGNOSIS — Y9210 Unspecified residential institution as the place of occurrence of the external cause: Secondary | ICD-10-CM | POA: Diagnosis not present

## 2012-04-02 DIAGNOSIS — R0789 Other chest pain: Secondary | ICD-10-CM | POA: Diagnosis not present

## 2012-04-02 DIAGNOSIS — I498 Other specified cardiac arrhythmias: Secondary | ICD-10-CM | POA: Diagnosis not present

## 2012-04-02 DIAGNOSIS — K929 Disease of digestive system, unspecified: Secondary | ICD-10-CM | POA: Diagnosis not present

## 2012-04-02 DIAGNOSIS — K9189 Other postprocedural complications and disorders of digestive system: Secondary | ICD-10-CM | POA: Diagnosis not present

## 2012-04-02 DIAGNOSIS — R0902 Hypoxemia: Secondary | ICD-10-CM | POA: Diagnosis not present

## 2012-04-02 DIAGNOSIS — T82898A Other specified complication of vascular prosthetic devices, implants and grafts, initial encounter: Secondary | ICD-10-CM | POA: Diagnosis not present

## 2012-04-02 DIAGNOSIS — K565 Intestinal adhesions [bands], unspecified as to partial versus complete obstruction: Principal | ICD-10-CM | POA: Diagnosis present

## 2012-04-02 DIAGNOSIS — N189 Chronic kidney disease, unspecified: Secondary | ICD-10-CM | POA: Diagnosis present

## 2012-04-02 DIAGNOSIS — Z8546 Personal history of malignant neoplasm of prostate: Secondary | ICD-10-CM

## 2012-04-02 DIAGNOSIS — Z79899 Other long term (current) drug therapy: Secondary | ICD-10-CM

## 2012-04-02 DIAGNOSIS — T81329A Deep disruption or dehiscence of operation wound, unspecified, initial encounter: Secondary | ICD-10-CM | POA: Diagnosis present

## 2012-04-02 DIAGNOSIS — I825Y9 Chronic embolism and thrombosis of unspecified deep veins of unspecified proximal lower extremity: Secondary | ICD-10-CM | POA: Diagnosis present

## 2012-04-02 DIAGNOSIS — R627 Adult failure to thrive: Secondary | ICD-10-CM | POA: Diagnosis present

## 2012-04-02 DIAGNOSIS — I120 Hypertensive chronic kidney disease with stage 5 chronic kidney disease or end stage renal disease: Secondary | ICD-10-CM | POA: Diagnosis present

## 2012-04-02 DIAGNOSIS — I953 Hypotension of hemodialysis: Secondary | ICD-10-CM | POA: Diagnosis not present

## 2012-04-02 DIAGNOSIS — M109 Gout, unspecified: Secondary | ICD-10-CM | POA: Diagnosis present

## 2012-04-02 DIAGNOSIS — Y841 Kidney dialysis as the cause of abnormal reaction of the patient, or of later complication, without mention of misadventure at the time of the procedure: Secondary | ICD-10-CM | POA: Diagnosis present

## 2012-04-02 DIAGNOSIS — I10 Essential (primary) hypertension: Secondary | ICD-10-CM | POA: Diagnosis present

## 2012-04-02 DIAGNOSIS — T8132XA Disruption of internal operation (surgical) wound, not elsewhere classified, initial encounter: Secondary | ICD-10-CM | POA: Diagnosis present

## 2012-04-02 DIAGNOSIS — R5381 Other malaise: Secondary | ICD-10-CM | POA: Diagnosis not present

## 2012-04-02 DIAGNOSIS — G4700 Insomnia, unspecified: Secondary | ICD-10-CM | POA: Diagnosis not present

## 2012-04-02 DIAGNOSIS — R000 Tachycardia, unspecified: Secondary | ICD-10-CM | POA: Diagnosis present

## 2012-04-02 DIAGNOSIS — T8130XA Disruption of wound, unspecified, initial encounter: Secondary | ICD-10-CM | POA: Diagnosis not present

## 2012-04-02 DIAGNOSIS — K219 Gastro-esophageal reflux disease without esophagitis: Secondary | ICD-10-CM | POA: Diagnosis present

## 2012-04-02 HISTORY — DX: Malignant neoplasm of prostate: C61

## 2012-04-02 LAB — COMPREHENSIVE METABOLIC PANEL
CO2: 28 mEq/L (ref 19–32)
Calcium: 11.3 mg/dL — ABNORMAL HIGH (ref 8.4–10.5)
Creatinine, Ser: 11.16 mg/dL — ABNORMAL HIGH (ref 0.50–1.35)
GFR calc Af Amer: 5 mL/min — ABNORMAL LOW (ref >90)
GFR calc non Af Amer: 5 mL/min — ABNORMAL LOW (ref >90)
Glucose, Bld: 116 mg/dL — ABNORMAL HIGH (ref 70–99)
Sodium: 142 mEq/L (ref 135–145)
Total Protein: 8 g/dL (ref 6.0–8.3)

## 2012-04-02 LAB — CBC
Hemoglobin: 17.6 g/dL — ABNORMAL HIGH (ref 13.0–17.0)
MCH: 29.1 pg (ref 26.0–34.0)
MCHC: 33.5 g/dL (ref 30.0–36.0)
MCV: 86.8 fL (ref 78.0–100.0)
Platelets: 91 10*3/uL — ABNORMAL LOW (ref 150–400)
RBC: 6.05 MIL/uL — ABNORMAL HIGH (ref 4.22–5.81)

## 2012-04-02 MED ORDER — ONDANSETRON HCL 4 MG/2ML IJ SOLN
4.0000 mg | Freq: Once | INTRAMUSCULAR | Status: AC
  Administered 2012-04-03: 4 mg via INTRAVENOUS
  Filled 2012-04-02: qty 2

## 2012-04-02 MED ORDER — HYDROMORPHONE HCL PF 1 MG/ML IJ SOLN
1.0000 mg | Freq: Once | INTRAMUSCULAR | Status: AC
  Administered 2012-04-03: 1 mg via INTRAVENOUS
  Filled 2012-04-02: qty 1

## 2012-04-02 NOTE — ED Notes (Signed)
Updated pt on wait for treatment room. 

## 2012-04-02 NOTE — ED Notes (Signed)
C/o generalized abd pain (R side worse than L), nausea, and vomiting (x3) since 1430 today.

## 2012-04-02 NOTE — ED Provider Notes (Signed)
History     CSN: [id number]  Arrival date & time 04/02/12  1827   First MD Initiated Contact with Patient 04/02/12 2307      Chief Complaint  Patient presents with  . Abdominal Pain    (Consider location/radiation/quality/duration/timing/severity/associated sxs/prior treatment) HPI HX provided by PT.  Dialysis PT with RLQ pain since 2pm today. N/v x 3, no bloody or bilious emesis. No diarrhea, last BM today was normal, not related to pain. Quality is sharp like being hit. Does not make urine. Last dialysis yesterday. No f/c. pain constant since onset. 7/10 at worst, now 7/10. Past Medical History  Diagnosis Date  . Dialysis patient   . Prostate cancer     History reviewed. No pertinent past surgical history.  No family history on file.  History  Substance Use Topics  . Smoking status: Never Smoker   . Smokeless tobacco: Not on file  . Alcohol Use: No      Review of Systems  Constitutional: Negative for fever and chills.  HENT: Negative for neck pain and neck stiffness.   Eyes: Negative for pain.  Respiratory: Negative for shortness of breath.   Cardiovascular: Negative for chest pain.  Gastrointestinal: Positive for nausea, vomiting and abdominal pain.  Genitourinary: Negative for dysuria.  Musculoskeletal: Negative for back pain.  Skin: Negative for rash.  Neurological: Negative for headaches.  All other systems reviewed and are negative.    Allergies  Allopurinol and Aspirin  Home Medications   Current Outpatient Rx  Name Route Sig Dispense Refill  . CALCIUM ACETATE 667 MG PO CAPS Oral Take 667 mg by mouth 3 (three) times daily with meals.    . COLCHICINE 0.6 MG PO TABS Oral Take 0.6 mg by mouth daily.    Marland Kitchen RENA-VITE PO TABS Oral Take 1 tablet by mouth daily.    Marland Kitchen PREDNISONE 5 MG PO TABS Oral Take 5 mg by mouth daily.      BP 179/104  Pulse 74  Temp 97.7 F (36.5 C) (Oral)  Resp 16  SpO2 100%  Physical Exam  Constitutional: He is oriented to  person, place, and time. He appears well-developed and well-nourished.  HENT:  Head: Normocephalic and atraumatic.  Eyes: Conjunctivae and EOM are normal. Pupils are equal, round, and reactive to light.  Neck: Trachea normal. Neck supple. No thyromegaly present.  Cardiovascular: Normal rate, regular rhythm, S1 normal, S2 normal and normal pulses.     No systolic murmur is present   No diastolic murmur is present  Pulses:      Radial pulses are 2+ on the right side, and 2+ on the left side.  Pulmonary/Chest: Effort normal and breath sounds normal. He has no wheezes. He has no rhonchi. He has no rales. He exhibits no tenderness.  Abdominal: Soft. Normal appearance and bowel sounds are normal. There is no CVA tenderness and negative Murphy's sign.       TTP over RLQ with guarding  Musculoskeletal:       BLE:s Calves nontender, no cords or erythema, negative Homans sign  Neurological: He is alert and oriented to person, place, and time. He has normal strength. No cranial nerve deficit or sensory deficit. GCS eye subscore is 4. GCS verbal subscore is 5. GCS motor subscore is 6.  Skin: Skin is warm and dry. No rash noted. He is not diaphoretic.  Psychiatric: His speech is normal.       Cooperative and appropriate    ED Course  Procedures (  including critical care time)  Results for orders placed during the hospital encounter of 04/02/12  CBC      Component Value Range   WBC 6.4  4.0 - 10.5 K/uL   RBC 6.05 (*) 4.22 - 5.81 MIL/uL   Hemoglobin 17.6 (*) 13.0 - 17.0 g/dL   HCT 21.3 (*) 08.6 - 57.8 %   MCV 86.8  78.0 - 100.0 fL   MCH 29.1  26.0 - 34.0 pg   MCHC 33.5  30.0 - 36.0 g/dL   RDW 46.9  62.9 - 52.8 %   Platelets 91 (*) 150 - 400 K/uL  COMPREHENSIVE METABOLIC PANEL      Component Value Range   Sodium 142  135 - 145 mEq/L   Potassium 5.0  3.5 - 5.1 mEq/L   Chloride 95 (*) 96 - 112 mEq/L   CO2 28  19 - 32 mEq/L   Glucose, Bld 116 (*) 70 - 99 mg/dL   BUN 45 (*) 6 - 23 mg/dL    Creatinine, Ser 41.32 (*) 0.50 - 1.35 mg/dL   Calcium 44.0 (*) 8.4 - 10.5 mg/dL   Total Protein 8.0  6.0 - 8.3 g/dL   Albumin 4.2  3.5 - 5.2 g/dL   AST 35  0 - 37 U/L   ALT 23  0 - 53 U/L   Alkaline Phosphatase 59  39 - 117 U/L   Total Bilirubin 0.5  0.3 - 1.2 mg/dL   GFR calc non Af Amer 5 (*) >90 mL/min   GFR calc Af Amer 5 (*) >90 mL/min   Ct Abdomen Pelvis Wo Contrast  04/03/2012  *RADIOLOGY REPORT*  Clinical Data: Abdominal pain  CT ABDOMEN AND PELVIS WITHOUT CONTRAST  Technique:  Multidetector CT imaging of the abdomen and pelvis was performed following the standard protocol without intravenous contrast.  Comparison: 11/01/2004  Findings: Coronary artery calcification.  Linear opacity at the left lung base.  Organ abnormality/lesion detection is limited in the absence of intravenous contrast. Within this limitation, unremarkable liver, biliary system, spleen, adrenal glands. There is dilatation of the main pancreatic duct, measuring up to 5 mm.  Atrophic native kidneys with bilateral cystic lesions of varying complexity.  Transplant kidney left lower quadrant with sinus lipomatosis and cortical thinning.  There is a loop of small bowel distended up to 3.3 cm and demonstrating air fluid level.  Distal small bowel is decompressed.  Colonic diverticulosis.  No CT evidence for diverticulitis  No free intraperitoneal air or fluid.  No lymphadenopathy.  Advanced atherosclerotic disease of the aorta and branch vessels.  Decompressed bladder.  Surgical clips in the pelvis.  Partially imaged left femoral bypass.  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 Unchanged cystic and sclerotic changes of the right femoral neck.  No acute osseous finding.  IMPRESSION: Dilated small bowel loops up to 3.3 cm with air-fluid levels. Decompressed bowel loops distally.  This may reflect an early small bowel obstruction.  Recommend correlation with symptoms and if no intervention, follow-up KUB to document  passage of contrast into the colon.  Advanced atherosclerotic disease.  Transplant kidney left lower quadrant with prominent renal sinus fat, similar to prior.  Bilateral native kidneys with incompletely characterized cystic lesions of varying complexity.  Consider follow-up renal MRI.  Dilated main pancreatic duct up to 5 mm.  No obstructing lesion visualized by CT.  This can also be better characterized at the time of MR.  Original Report Authenticated By: Waneta Martins, M.D.  IV Dilaudid for pain control. IV Zofran for nausea. Labs and imaging obtained and reviewed as above. For small bowel obstruction NG tube was placed and general surgery and medicine consults obtained   6:19 AM d/w Gen Surgery Dr Andrey Campanile, recs MEd admit. Will follow as needed.  6:24 AM d/w [facility]ad hospitalist on call, will evaluate for admit.  MDM   Abdominal pain with early Small bowel obstruction on CAT scan as above. Nursing notes reviewed. Old records reviewed. General surgery and medicine consult obtained. Plan medical admission. Vital signs reviewed as above is hypertensive with history of same, likely related to pain and discomfort.        Sunnie Nielsen, MD 04/03/12 2300

## 2012-04-02 NOTE — ED Notes (Signed)
Pt reports having dialysis yesterday with no complications.

## 2012-04-03 ENCOUNTER — Encounter (HOSPITAL_COMMUNITY): Payer: Self-pay | Admitting: Internal Medicine

## 2012-04-03 ENCOUNTER — Emergency Department (HOSPITAL_COMMUNITY): Payer: Medicare Other

## 2012-04-03 ENCOUNTER — Inpatient Hospital Stay (HOSPITAL_COMMUNITY): Payer: Medicare Other

## 2012-04-03 DIAGNOSIS — N186 End stage renal disease: Secondary | ICD-10-CM | POA: Diagnosis present

## 2012-04-03 DIAGNOSIS — K56609 Unspecified intestinal obstruction, unspecified as to partial versus complete obstruction: Secondary | ICD-10-CM | POA: Diagnosis present

## 2012-04-03 DIAGNOSIS — I10 Essential (primary) hypertension: Secondary | ICD-10-CM

## 2012-04-03 DIAGNOSIS — D751 Secondary polycythemia: Secondary | ICD-10-CM | POA: Diagnosis present

## 2012-04-03 LAB — CBC
HCT: 48.7 % (ref 39.0–52.0)
Hemoglobin: 16.2 g/dL (ref 13.0–17.0)
MCHC: 33.3 g/dL (ref 30.0–36.0)
MCV: 87.1 fL (ref 78.0–100.0)

## 2012-04-03 LAB — RENAL FUNCTION PANEL
CO2: 27 mEq/L (ref 19–32)
Calcium: 10 mg/dL (ref 8.4–10.5)
Chloride: 94 mEq/L — ABNORMAL LOW (ref 96–112)
Creatinine, Ser: 12.77 mg/dL — ABNORMAL HIGH (ref 0.50–1.35)
Glucose, Bld: 94 mg/dL (ref 70–99)

## 2012-04-03 LAB — MRSA PCR SCREENING: MRSA by PCR: NEGATIVE

## 2012-04-03 LAB — CREATININE, SERUM
GFR calc Af Amer: 5 mL/min — ABNORMAL LOW (ref >90)
GFR calc non Af Amer: 4 mL/min — ABNORMAL LOW (ref >90)

## 2012-04-03 MED ORDER — ONDANSETRON HCL 4 MG PO TABS: 4.0000 mg | ORAL_TABLET | Freq: Four times a day (QID) | ORAL | Status: DC | PRN

## 2012-04-03 MED ORDER — ONDANSETRON HCL 4 MG/2ML IJ SOLN
INTRAMUSCULAR | Status: AC
  Administered 2012-04-03: 4 mg via INTRAVENOUS
  Filled 2012-04-03: qty 2

## 2012-04-03 MED ORDER — CALCIUM ACETATE 667 MG PO CAPS
667.0000 mg | ORAL_CAPSULE | Freq: Three times a day (TID) | ORAL | Status: DC
  Administered 2012-04-03: 667 mg via ORAL
  Filled 2012-04-03 (×10): qty 1

## 2012-04-03 MED ORDER — HEPARIN SODIUM (PORCINE) 5000 UNIT/ML IJ SOLN
5000.0000 [IU] | Freq: Three times a day (TID) | INTRAMUSCULAR | Status: DC
  Administered 2012-04-03 – 2012-04-08 (×13): 5000 [IU] via SUBCUTANEOUS
  Filled 2012-04-03 (×18): qty 1

## 2012-04-03 MED ORDER — SODIUM CHLORIDE 0.9 % IV SOLN
INTRAVENOUS | Status: DC
  Administered 2012-04-03: 11:00:00 via INTRAVENOUS

## 2012-04-03 MED ORDER — ACETAMINOPHEN 650 MG RE SUPP: 650.0000 mg | Freq: Four times a day (QID) | RECTAL | Status: DC | PRN

## 2012-04-03 MED ORDER — DEXTROSE-NACL 5-0.9 % IV SOLN
INTRAVENOUS | Status: AC
  Administered 2012-04-03 – 2012-04-04 (×2): via INTRAVENOUS

## 2012-04-03 MED ORDER — HYDRALAZINE HCL 20 MG/ML IJ SOLN
10.0000 mg | Freq: Four times a day (QID) | INTRAMUSCULAR | Status: DC | PRN
  Administered 2012-04-03 – 2012-04-25 (×13): 10 mg via INTRAVENOUS
  Filled 2012-04-03 (×13): qty 0.5

## 2012-04-03 MED ORDER — HYDROMORPHONE HCL PF 1 MG/ML IJ SOLN
1.0000 mg | Freq: Once | INTRAMUSCULAR | Status: AC
  Administered 2012-04-03: 1 mg via INTRAVENOUS
  Filled 2012-04-03: qty 1

## 2012-04-03 MED ORDER — ONDANSETRON HCL 4 MG/2ML IJ SOLN
4.0000 mg | Freq: Once | INTRAMUSCULAR | Status: AC
  Administered 2012-04-03: 4 mg via INTRAVENOUS

## 2012-04-03 MED ORDER — IOHEXOL 300 MG/ML  SOLN
20.0000 mL | INTRAMUSCULAR | Status: AC
  Administered 2012-04-03 (×2): 20 mL via ORAL

## 2012-04-03 MED ORDER — ONDANSETRON HCL 4 MG/2ML IJ SOLN
4.0000 mg | Freq: Four times a day (QID) | INTRAMUSCULAR | Status: DC | PRN
  Administered 2012-04-10 – 2012-04-29 (×6): 4 mg via INTRAVENOUS
  Filled 2012-04-03 (×7): qty 2

## 2012-04-03 MED ORDER — ACETAMINOPHEN 325 MG PO TABS
650.0000 mg | ORAL_TABLET | Freq: Four times a day (QID) | ORAL | Status: DC | PRN
  Administered 2012-04-09 – 2012-04-10 (×3): 650 mg via ORAL
  Filled 2012-04-03 (×4): qty 2

## 2012-04-03 MED ORDER — PREDNISONE 5 MG PO TABS
5.0000 mg | ORAL_TABLET | Freq: Every day | ORAL | Status: DC
  Administered 2012-04-04 – 2012-04-15 (×10): 5 mg via ORAL
  Filled 2012-04-03 (×14): qty 1

## 2012-04-03 MED ORDER — MORPHINE SULFATE 4 MG/ML IJ SOLN
INTRAMUSCULAR | Status: AC
  Administered 2012-04-04: 4 mg via INTRAVENOUS
  Filled 2012-04-03: qty 1

## 2012-04-03 MED ORDER — MORPHINE SULFATE 4 MG/ML IJ SOLN
4.0000 mg | INTRAMUSCULAR | Status: DC | PRN
  Administered 2012-04-03 – 2012-04-13 (×13): 4 mg via INTRAVENOUS
  Filled 2012-04-03 (×11): qty 1

## 2012-04-03 MED ORDER — ONDANSETRON HCL 4 MG/2ML IJ SOLN
4.0000 mg | Freq: Once | INTRAMUSCULAR | Status: AC
  Administered 2012-04-03: 4 mg via INTRAVENOUS
  Filled 2012-04-03: qty 2

## 2012-04-03 MED ORDER — ONDANSETRON HCL 4 MG/2ML IJ SOLN
INTRAMUSCULAR | Status: AC
  Filled 2012-04-03: qty 2

## 2012-04-03 MED ORDER — MORPHINE SULFATE 4 MG/ML IJ SOLN
INTRAMUSCULAR | Status: AC
  Administered 2012-04-03: 4 mg via INTRAVENOUS
  Filled 2012-04-03: qty 1

## 2012-04-03 NOTE — Consult Note (Signed)
Reason for Consult:Small bowel obstruction Referring Physician: [name] is an 51 y.o. male.  HPI: Patient is known to the surgery service with previous colectomy with colostomy and subsequent colostomy takedown by Dr. Lindie Mora. Last night he developed abdominal pain with nausea and vomiting. He was evaluated in the emergency room this morning. History and CT scan are consistent with small bowel obstruction. He was admitted to the medical service. He is currently in hemodialysis unit undergoing dialysis. He has had end-stage renal disease for about 9 years. His abdominal pain is improved. He denies any change in bowel habits up to the past 24-48 hours. He gets dialysis Tuesdays, Thursdays, and Saturdays.  Past Medical History  Diagnosis Date  . Dialysis patient   . Prostate cancer     Past surgical history: Prostatectomy, colectomy with colostomy, colostomy takedown  Family History  Problem Relation Age of Onset  . Hypertension Father   . Pneumonia Brother   . Lung disease Brother     Social History:  reports that he has never smoked. He does not have any smokeless tobacco history on file. He reports that he does not drink alcohol or use illicit drugs.  Allergies:  Allergies  Allergen Reactions  . Allopurinol     REACTION: decreased platelets  . Aspirin     REACTION: unspecified    Medications: I have reviewed the patient's current medications.  Results for orders placed during the hospital encounter of 04/02/12 (from the past 48 hour(s))  CBC     Status: Abnormal   Collection Time   04/02/12  7:18 PM      Component Value Range Comment   WBC 6.4  4.0 - 10.5 K/uL    RBC 6.05 (*) 4.22 - 5.81 MIL/uL    Hemoglobin 17.6 (*) 13.0 - 17.0 g/dL    HCT 16.1 (*) 09.6 - 52.0 %    MCV 86.8  78.0 - 100.0 fL    MCH 29.1  26.0 - 34.0 pg    MCHC 33.5  30.0 - 36.0 g/dL    RDW 04.5  40.9 - 81.1 %    Platelets 91 (*) 150 - 400 K/uL PLATELET COUNT CONFIRMED BY SMEAR  COMPREHENSIVE  METABOLIC PANEL     Status: Abnormal   Collection Time   04/02/12  7:18 PM      Component Value Range Comment   Sodium 142  135 - 145 mEq/L    Potassium 5.0  3.5 - 5.1 mEq/L    Chloride 95 (*) 96 - 112 mEq/L    CO2 28  19 - 32 mEq/L    Glucose, Bld 116 (*) 70 - 99 mg/dL    BUN 45 (*) 6 - 23 mg/dL    Creatinine, Ser 91.47 (*) 0.50 - 1.35 mg/dL    Calcium 82.9 (*) 8.4 - 10.5 mg/dL    Total Protein 8.0  6.0 - 8.3 g/dL    Albumin 4.2  3.5 - 5.2 g/dL    AST 35  0 - 37 U/L HEMOLYSIS AT THIS LEVEL MAY AFFECT RESULT   ALT 23  0 - 53 U/L    Alkaline Phosphatase 59  39 - 117 U/L    Total Bilirubin 0.5  0.3 - 1.2 mg/dL    GFR calc non Af Amer 5 (*) >90 mL/min    GFR calc Af Amer 5 (*) >90 mL/min   MRSA PCR SCREENING     Status: Normal   Collection Time   04/03/12  10:10 AM      Component Value Range Comment   MRSA by PCR NEGATIVE  NEGATIVE   CBC     Status: Abnormal   Collection Time   04/03/12 11:00 AM      Component Value Range Comment   WBC 9.1  4.0 - 10.5 K/uL    RBC 5.59  4.22 - 5.81 MIL/uL    Hemoglobin 16.2  13.0 - 17.0 g/dL    HCT 16.1  09.6 - 04.5 %    MCV 87.1  78.0 - 100.0 fL    MCH 29.0  26.0 - 34.0 pg    MCHC 33.3  30.0 - 36.0 g/dL    RDW 40.9  81.1 - 91.4 %    Platelets 89 (*) 150 - 400 K/uL CONSISTENT WITH PREVIOUS RESULT  CREATININE, SERUM     Status: Abnormal   Collection Time   04/03/12 11:00 AM      Component Value Range Comment   Creatinine, Ser 12.42 (*) 0.50 - 1.35 mg/dL    GFR calc non Af Amer 4 (*) >90 mL/min    GFR calc Af Amer 5 (*) >90 mL/min   RENAL FUNCTION PANEL     Status: Abnormal   Collection Time   04/03/12  6:15 PM      Component Value Range Comment   Sodium 140  135 - 145 mEq/L    Potassium 4.7  3.5 - 5.1 mEq/L    Chloride 94 (*) 96 - 112 mEq/L    CO2 27  19 - 32 mEq/L    Glucose, Bld 94  70 - 99 mg/dL    BUN 59 (*) 6 - 23 mg/dL    Creatinine, Ser 78.29 (*) 0.50 - 1.35 mg/dL    Calcium 56.2  8.4 - 10.5 mg/dL    Phosphorus 6.0 (*) 2.3 - 4.6  mg/dL    Albumin 3.6  3.5 - 5.2 g/dL    GFR calc non Af Amer 4 (*) >90 mL/min    GFR calc Af Amer 5 (*) >90 mL/min     Ct Abdomen Pelvis Wo Contrast  04/03/2012  *RADIOLOGY REPORT*  Clinical Data: Abdominal pain  CT ABDOMEN AND PELVIS WITHOUT CONTRAST  Technique:  Multidetector CT imaging of the abdomen and pelvis was performed following the standard protocol without intravenous contrast.  Comparison: 11/01/2004  Findings: Coronary artery calcification.  Linear opacity at the left lung base.  Organ abnormality/lesion detection is limited in the absence of intravenous contrast. Within this limitation, unremarkable liver, biliary system, spleen, adrenal glands. There is dilatation of the main pancreatic duct, measuring up to 5 mm.  Atrophic native kidneys with bilateral cystic lesions of varying complexity.  Transplant kidney left lower quadrant with sinus lipomatosis and cortical thinning.  There is a loop of small bowel distended up to 3.3 cm and demonstrating air fluid level.  Distal small bowel is decompressed.  Colonic diverticulosis.  No CT evidence for diverticulitis  No free intraperitoneal air or fluid.  No lymphadenopathy.  Advanced atherosclerotic disease of the aorta and branch vessels.  Decompressed bladder.  Surgical clips in the pelvis.  Partially imaged left femoral bypass.  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 Unchanged cystic and sclerotic changes of the right femoral neck.  No acute osseous finding.  IMPRESSION: Dilated small bowel loops up to 3.3 cm with air-fluid levels. Decompressed bowel loops distally.  This may reflect an early small bowel obstruction.  Recommend correlation with symptoms and if no intervention,  follow-up KUB to document passage of contrast into the colon.  Advanced atherosclerotic disease.  Transplant kidney left lower quadrant with prominent renal sinus fat, similar to prior.  Bilateral native kidneys with incompletely characterized cystic lesions of  varying complexity.  Consider follow-up renal MRI.  Dilated main pancreatic duct up to 5 mm.  No obstructing lesion visualized by CT.  This can also be better characterized at the time of MR.  Original Report Authenticated By: Waneta Martins, M.D.    Review of Systems  Constitutional: Negative.   HENT: Negative.   Respiratory: Negative.   Cardiovascular: Negative.   Gastrointestinal: Positive for nausea, vomiting and abdominal pain. Negative for blood in stool and melena.  Genitourinary:       End-stage renal disease  Musculoskeletal: Negative.   Skin: Negative.   Neurological: Negative.   Endo/Heme/Allergies: Negative.    Blood pressure 177/103, pulse 75, temperature 97.8 F (36.6 C), temperature source Oral, resp. rate 15, height 5\' 11"  (1.803 m), weight 52.5 kg (115 lb 11.9 oz), SpO2 95.00%. Physical Exam  Constitutional: He is oriented to person, place, and time. No distress.       Chronically ill appearing  HENT:  Head: Normocephalic and atraumatic.       Nasogastric tube in place  Neck: Normal range of motion. Neck supple. No tracheal deviation present.  Cardiovascular: Normal rate, regular rhythm, normal heart sounds and intact distal pulses.   No murmur heard. Respiratory: Effort normal and breath sounds normal. No stridor. No respiratory distress. He has no wheezes. He has no rales.  GI: Soft. He exhibits distension. He exhibits no mass. There is tenderness. There is no rebound and no guarding.       Abdomen is mildly distended with very minimal tenderness and no masses. Bowel sounds are very hypoactive. Incisions are healed without hernia.  Musculoskeletal:       Dialysis access left thigh  Lymphadenopathy:    He has no cervical adenopathy.  Neurological: He is alert and oriented to person, place, and time.       Speech fluent  Skin: Skin is warm and dry.    Assessment/Plan: Small bowel obstruction likely due to adhesions from previous surgery. Agree with medical  admission, bowel rest, nasogastric tube decompression, and continuation of dialysis. We will follow closely. If he does not improve in the next 24-48 hours or certainly if he worsens in any way he may require laparotomy.  Plan was discussed in detail with the patient. I answered his questions.  Shantai Tiedeman E 04/03/2012, 7:58 PM

## 2012-04-03 NOTE — ED Notes (Signed)
Awaiting surgical consult.

## 2012-04-03 NOTE — ED Notes (Signed)
IV team called back, enroute to ED for IV attempt.

## 2012-04-03 NOTE — ED Notes (Signed)
Patient vomiting at this time.  Awaiting general surgery consult.

## 2012-04-03 NOTE — Consult Note (Signed)
[facility] Renal Consultation Note  Indication for Consultation:  Management of ESRD/hemodialysis; anemia, hypertension/volume and secondary hyperparathyroidism  HPI: [name] is a 51 y.o. male with ESRD on HD at the [facility] on TTS who presented to the ED early this morning for worsening periumbilical abdominal pain since yesterday afternoon and several episodes of vomiting during the night.  His pain has improved with narcotics, but CT of the abdomen showed dilated small bowel loops with air-fluid levels and decompressed loops distally, suggesting small bowel obstruction. He is currently NPO with surgical consult pending.  His history includes renal transplant in 1987 at [facility], perforated sigmoid diverticulum with exploratory lap and sigmoid colectomy with colostomy in 04/2003 at [facility] (colostomy takedown in 10/2003), and prostatectomy (secondary to cancer) in 09/2010 at [facility].   Dialysis Orders: Center: [facility] on TTS. EDW 52.5 kg  HD Bath 2K/2Ca  Time 3.5 hrs  Heparin 5000 U. Access AVG @ left thigh   BFR 400 DFR 800    Zemplar 0 mcg IV/HD   Epogen 0 Units IV/HD  Venofer  0.  Past Medical History  Diagnosis Date  . Dialysis patient   . Prostate cancer    History reviewed. No pertinent past surgical history. Family History  Problem Relation Age of Onset  . Hypertension Father   . Pneumonia Brother   . Lung disease Brother    Social History He has a remote history of alcohol and tobacco use, including a pack of cigarettes a day for 10-12 years before quitting around 20 years ago. Marland Kitchen He does not have any smokeless tobacco history on file. He states that he never used illicit drugs.  He lives alone and previously worked in Set [employer] in [place].  Allergies  Allergen Reactions  . Allopurinol     REACTION: decreased platelets  . Aspirin     REACTION: unspecified   Prior to Admission medications   Medication Sig Start Date End Date Taking?  Authorizing Provider  calcium acetate (PHOSLO) 667 MG capsule Take 667 mg by mouth 3 (three) times daily with meals.   Yes Historical Provider, MD  colchicine 0.6 MG tablet Take 0.6 mg by mouth daily.   Yes Historical Provider, MD  multivitamin (RENA-VIT) TABS tablet Take 1 tablet by mouth daily.   Yes Historical Provider, MD  predniSONE (DELTASONE) 5 MG tablet Take 5 mg by mouth daily.   Yes Historical Provider, MD   Labs:  Results for orders placed during the hospital encounter of 04/02/12 (from the past 48 hour(s))  CBC     Status: Abnormal   Collection Time   04/02/12  7:18 PM      Component Value Range Comment   WBC 6.4  4.0 - 10.5 K/uL    RBC 6.05 (*) 4.22 - 5.81 MIL/uL    Hemoglobin 17.6 (*) 13.0 - 17.0 g/dL    HCT 40.9 (*) 81.1 - 52.0 %    MCV 86.8  78.0 - 100.0 fL    MCH 29.1  26.0 - 34.0 pg    MCHC 33.5  30.0 - 36.0 g/dL    RDW 91.4  78.2 - 95.6 %    Platelets 91 (*) 150 - 400 K/uL PLATELET COUNT CONFIRMED BY SMEAR  COMPREHENSIVE METABOLIC PANEL     Status: Abnormal   Collection Time   04/02/12  7:18 PM      Component Value Range Comment   Sodium 142  135 - 145 mEq/L    Potassium  5.0  3.5 - 5.1 mEq/L    Chloride 95 (*) 96 - 112 mEq/L    CO2 28  19 - 32 mEq/L    Glucose, Bld 116 (*) 70 - 99 mg/dL    BUN 45 (*) 6 - 23 mg/dL    Creatinine, Ser 16.10 (*) 0.50 - 1.35 mg/dL    Calcium 96.0 (*) 8.4 - 10.5 mg/dL    Total Protein 8.0  6.0 - 8.3 g/dL    Albumin 4.2  3.5 - 5.2 g/dL    AST 35  0 - 37 U/L HEMOLYSIS AT THIS LEVEL MAY AFFECT RESULT   ALT 23  0 - 53 U/L    Alkaline Phosphatase 59  39 - 117 U/L    Total Bilirubin 0.5  0.3 - 1.2 mg/dL    GFR calc non Af Amer 5 (*) >90 mL/min    GFR calc Af Amer 5 (*) >90 mL/min    Ears, nose, mouth, throat, and face: negative for earaches, hoarseness, nasal congestion and sore throat Respiratory: negative for cough, dyspnea on exertion, hemoptysis and wheezing Cardiovascular: negative for chest pain, fatigue, orthopnea and  palpitations Gastrointestinal: positive for abdominal pain, nausea and vomiting Genitourinary:negative, oliguric Musculoskeletal:negative for arthralgias, back pain, muscle weakness and neck pain Neurological: negative for coordination problems, dizziness, gait problems and headaches  Physical Exam: Filed Vitals:   04/03/12 0922  BP: 205/88  Pulse: 70  Temp: 97.7 F (36.5 C)  Resp: 16     General appearance: alert, cooperative and no distress Head: Normocephalic, without obvious abnormality, atraumatic Neck: no adenopathy, no carotid bruit, no JVD and supple, symmetrical, trachea midline Resp: clear to auscultation bilaterally Cardio: regular rate and rhythm, S1, S2 normal, no murmur, click, rub or gallop GI: decreased BS, nondistended, soft with general tenderness to palpation Extremities: extremities normal, atraumatic, no cyanosis or edema Lymph nodes: Cervical, supraclavicular, and axillary nodes normal. Neurologic: Grossly normal Dialysis Access: Left thigh AVG   Assessment/Plan: 1. Abdominal pain/ nausea and vomiting - CT abdomen suggests small bowel obstruction; history of multiple abdominal surgeries; currently NPO with NG pending, surgical consult pending, pain controlled on narcotics.. 2. ESRD -  HD (since '04) @ [facility] on TTS, K 5 yesterday.  HD today. 3. Hypertension/volume  - BP currently elevated at 205/88, no outpatient meds; current wt of 61 kg, although outpatient EDW 52.5 kg; on NS @ 75 ml/hr.  DC IVF. 4. Anemia  - Hgb 17.6, no Epo or Fe. 5. Metabolic bone disease -  Ca 11.3, last P 7.6 on 6/20, on Fosrenol 1 g with meals and Phoslo 3 with meals, 2 with snacks.  6. Nutrition - Last Alb 3.8 on 6/20. 7. S/p renal transplant - at [facility] in '87, started HD in 04/2003, remains on Prednisone. 8. History of prostate cancer - s/p prostatectomy @ [facility]  in 09/2010. 9. History of gout - on Colcrys qd, allergic to Allopurinol.  10. Hepatitis C  [name] 04/03/2012, 10:37 AM     Attending Nephrologist: Zetta Bills, MD

## 2012-04-03 NOTE — Consult Note (Signed)
I have personally seen and examined this patient and agree with the assessment/plan as outlined above by [name] PA.  [name] is a 51yo AAM with a history of ESRD s/p failed KTx here for abdominal pain that is due to SBO- currently s/p NGT placement and on bowel rest. Will continue HD on a TTS schedule.   Kejon Feild K.,MD 04/03/2012 12:38 PM

## 2012-04-03 NOTE — ED Notes (Signed)
Patient with abdominal pain and vomiting.  Patient states that it all started this afternoon.  Patient states he has constant nausea.  Patient states that the pain is constant, generalized all over abdomen.

## 2012-04-03 NOTE — CV Procedure (Signed)
Patient seen on Hemodialysis. QB 400, UF goal 2000 Treatment adjusted as needed.  Zetta Bills MD [facility]. Office # [phone] Pager # [phone] 4:13 PM

## 2012-04-03 NOTE — ED Notes (Signed)
IV attempt x2 unsuccessful. IV team paged 

## 2012-04-03 NOTE — H&P (Signed)
Abdominal pain 1 day PTA, progressively got worst., no radiation, periumbilical. Made worst with food,  Took tums did not improved. N then vomiting 2-3 times  [facility] History and Physical  [name] WUJ:[id number] DOB: 1961-01-08 DOA: 04/02/2012   PCP: Trevor Iha, MD   Chief Complaint: Abdominal pain nausea and vomiting  HPI:  This is a 51 year old male with past medical history of end-stage renal disease which dialyzes Monday Wednesdays and Fridays, who had a renal transplant about 17 years ago, prostatectomy in 2012, comes in for abdominal pain nausea or vomiting. He relates one day prior to admission he started having abdominal pain which progressively got worse it was mainly periumbilical nothing made it better or worse had no radiation. But in the middle of the night he took some TUMS and the pain did not improve. After this he started vomiting about 3 or 4 times. So he decided to come here to the emergency room. Here in the emergency room his pain was relieved by narcotics. A CT scan of the abdomen and pelvis was done that shows small bowel obstruction we were asked to mid and further evaluate.  Review of Systems:  Constitutional:  No weight loss, night sweats, Fevers, chills, fatigue.  HEENT:  No headaches, Difficulty swallowing,Tooth/dental problems,Sore throat,  No sneezing, itching, ear ache, nasal congestion, post nasal drip,  Cardio-vascular:  No chest pain, Orthopnea, PND, swelling in lower extremities, anasarca, dizziness, palpitations  GI:  No heartburn, indigestion,  Resp:  No shortness of breath with exertion or at rest. No excess mucus, no productive cough, No non-productive cough, No coughing up of blood.No change in color of mucus.No wheezing.No chest wall deformity  Skin:  no rash or lesions.  GU:  no dysuria, change in color of urine, no urgency or frequency. No flank pain.  Musculoskeletal:  No joint pain or swelling. No decreased range of  motion. No back pain.  Psych:  No change in mood or affect. No depression or anxiety. No memory loss.    Past Medical History  Diagnosis Date  . Dialysis patient   . Prostate cancer    History reviewed. No pertinent past surgical history. Social History:  reports that he has never smoked. He does not have any smokeless tobacco history on file. He reports that he does not drink alcohol or use illicit drugs.  Allergies  Allergen Reactions  . Allopurinol     REACTION: decreased platelets  . Aspirin     REACTION: unspecified    Family History  Problem Relation Age of Onset  . Hypertension Father   . Pneumonia Brother   . Lung disease Brother     Prior to Admission medications   Medication Sig Start Date End Date Taking? Authorizing Provider  calcium acetate (PHOSLO) 667 MG capsule Take 667 mg by mouth 3 (three) times daily with meals.   Yes Historical Provider, MD  colchicine 0.6 MG tablet Take 0.6 mg by mouth daily.   Yes Historical Provider, MD  multivitamin (RENA-VIT) TABS tablet Take 1 tablet by mouth daily.   Yes Historical Provider, MD  predniSONE (DELTASONE) 5 MG tablet Take 5 mg by mouth daily.   Yes Historical Provider, MD   Physical Exam: Filed Vitals:   04/03/12 0330 04/03/12 0345 04/03/12 0545 04/03/12 0605  BP: 176/82  194/86 198/92  Pulse: 66 67 68 66  Temp:    97.8 F (36.6 C)  TempSrc:    Oral  Resp:   18 16  SpO2: 96% 98% 95% 95%   BP 198/92  Pulse 66  Temp 97.8 F (36.6 C) (Oral)  Resp 16  SpO2 95%  General Appearance:    Alert, cooperative, no distress, appears stated age  Head:    Normocephalic, without obvious abnormality, atraumatic  Eyes:    PERRL, conjunctiva/corneas clear, EOM's intact, fundi    benign, both eyes       Ears:    Normal TM's and external ear canals, both ears  Nose:   Nares normal, septum midline, mucosa normal, no drainage    or sinus tenderness  Throat:   Lips, mucosa, and tongue normal; teeth and gums normal  Neck:    Supple, symmetrical, trachea midline, no adenopathy;       thyroid:  No enlargement/tenderness/nodules; no carotid   bruit or JVD  Back:     Symmetric, no curvature, ROM normal, no CVA tenderness  Lungs:     Clear to auscultation bilaterally, respirations unlabored  Chest wall:    No tenderness or deformity  Heart:    Regular rate and rhythm, S1 and S2 normal, no murmur, rub   or gallop  Abdomen:     soft, nondistended minimal bowel sounds, right lower cord and tenderness         Extremities:   Extremities normal, atraumatic, no cyanosis or edema  Pulses:   2+ and symmetric all extremities  Skin:   Skin color, texture, turgor normal, multiple scars in his arms and abdomen from previous surgeries and declotting of graft.   Lymph nodes:   Cervical, supraclavicular, and axillary nodes normal  Neurologic:   CNII-XII intact. Normal strength, sensation and reflexes      throughout    Labs on Admission:  Basic Metabolic Panel:  Lab 04/02/12 4098  NA 142  K 5.0  CL 95*  CO2 28  GLUCOSE 116*  BUN 45*  CREATININE 11.16*  CALCIUM 11.3*  MG --  PHOS --   Liver Function Tests:  Lab 04/02/12 1918  AST 35  ALT 23  ALKPHOS 59  BILITOT 0.5  PROT 8.0  ALBUMIN 4.2   No results found for this basename: LIPASE:5,AMYLASE:5 in the last 168 hours No results found for this basename: AMMONIA:5 in the last 168 hours CBC:  Lab 04/02/12 1918  WBC 6.4  NEUTROABS --  HGB 17.6*  HCT 52.5*  MCV 86.8  PLT 91*   Cardiac Enzymes: No results found for this basename: CKTOTAL:5,CKMB:5,CKMBINDEX:5,TROPONINI:5 in the last 168 hours BNP: No components found with this basename: POCBNP:5 CBG: No results found for this basename: GLUCAP:5 in the last 168 hours  Radiological Exams on Admission: Ct Abdomen Pelvis Wo Contrast  04/03/2012  *RADIOLOGY REPORT*  Clinical Data: Abdominal pain  CT ABDOMEN AND PELVIS WITHOUT CONTRAST  Technique:  Multidetector CT imaging of the abdomen and pelvis was  performed following the standard protocol without intravenous contrast.  Comparison: 11/01/2004  Findings: Coronary artery calcification.  Linear opacity at the left lung base.  Organ abnormality/lesion detection is limited in the absence of intravenous contrast. Within this limitation, unremarkable liver, biliary system, spleen, adrenal glands. There is dilatation of the main pancreatic duct, measuring up to 5 mm.  Atrophic native kidneys with bilateral cystic lesions of varying complexity.  Transplant kidney left lower quadrant with sinus lipomatosis and cortical thinning.  There is a loop of small bowel distended up to 3.3 cm and demonstrating air fluid level.  Distal small bowel is decompressed.  Colonic diverticulosis.  No CT evidence for diverticulitis  No free intraperitoneal air or fluid.  No lymphadenopathy.  Advanced atherosclerotic disease of the aorta and branch vessels.  Decompressed bladder.  Surgical clips in the pelvis.  Partially imaged left femoral bypass.  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 Unchanged cystic and sclerotic changes of the right femoral neck.  No acute osseous finding.  IMPRESSION: Dilated small bowel loops up to 3.3 cm with air-fluid levels. Decompressed bowel loops distally.  This may reflect an early small bowel obstruction.  Recommend correlation with symptoms and if no intervention, follow-up KUB to document passage of contrast into the colon.  Advanced atherosclerotic disease.  Transplant kidney left lower quadrant with prominent renal sinus fat, similar to prior.  Bilateral native kidneys with incompletely characterized cystic lesions of varying complexity.  Consider follow-up renal MRI.  Dilated main pancreatic duct up to 5 mm.  No obstructing lesion visualized by CT.  This can also be better characterized at the time of MR.  Original Report Authenticated By: Waneta Martins, M.D.    EKG: Independently reviewed. none  Assessment/Plan: Active  Problems:  SBO (small bowel obstruction): Recent pelvic surgery one year ago for his prostate, with a CT scan that shows results as above. Place n.p.o. Will go ahead and place an NG tube put to intermittent suction. The ED has already called the surgeon for consult. I would try to minimize narcotics and asked renal to see to keep his electrolytes imbalance. We'll put him n.p.o. and admit him to the med surge unit. And encouraged him to ambulate as much is possible, we'll put him on a low dose of normal saline, and check daily renal panels.   ESRD (end stage renal disease) Have called renal for dialysis. He usually dialyzes Monday Wednesdays and Fridays. Continue his prednisone low dose.  Erythrocytosis: Most likely secondary to hemoconcentration, we'll start on IV fluids and check a CBC in the morning.  Anemia: Management per renal.  Secondary hyperparathyroidism: Management per renal we'll continue his home meds.  Time spend: Greater than 45 minutes Code Status: Full code Family Communication: Sister [phone] Disposition Plan: To be determined  Marinda Elk, MD  [facility] Pager [phone]  If 7PM-7AM, please contact night-coverage [email or web address] Password [facility] 04/03/2012, 7:44 AM

## 2012-04-03 NOTE — ED Notes (Signed)
Notified Dr Janee Morn of surgical consult.

## 2012-04-03 NOTE — ED Notes (Signed)
Dialysis Center notified that pt will not be there this am.

## 2012-04-04 LAB — CBC
HCT: 50.4 % (ref 39.0–52.0)
Hemoglobin: 16.7 g/dL (ref 13.0–17.0)
MCH: 28.9 pg (ref 26.0–34.0)
MCV: 87.2 fL (ref 78.0–100.0)
RBC: 5.78 MIL/uL (ref 4.22–5.81)
WBC: 13.6 10*3/uL — ABNORMAL HIGH (ref 4.0–10.5)

## 2012-04-04 MED ORDER — LABETALOL HCL 5 MG/ML IV SOLN
5.0000 mg | Freq: Once | INTRAVENOUS | Status: AC
  Administered 2012-04-04: 5 mg via INTRAVENOUS
  Filled 2012-04-04: qty 4

## 2012-04-04 MED ORDER — DIPHENHYDRAMINE HCL 50 MG/ML IJ SOLN: 12.5000 mg | Freq: Once | INTRAMUSCULAR | Status: DC

## 2012-04-04 MED ORDER — AMLODIPINE BESYLATE 5 MG PO TABS
5.0000 mg | ORAL_TABLET | Freq: Every day | ORAL | Status: DC
  Administered 2012-04-04: 5 mg via ORAL
  Filled 2012-04-04 (×3): qty 1

## 2012-04-04 NOTE — Progress Notes (Signed)
Subjective: Stomach less distended per pt; no flatus/bm. Pain not as often; 400+ ng  Objective: Vital signs in last 24 hours: Temp:  [97.5 F (36.4 C)-98.4 F (36.9 C)] 98.4 F (36.9 C) (07/14 0509) Pulse Rate:  [62-88] 88  (07/14 0509) Resp:  [15-20] 18  (07/14 0509) BP: (155-224)/(84-129) 188/88 mmHg (07/14 0509) SpO2:  [95 %-98 %] 97 % (07/14 0509) Weight:  [113 lb 8.6 oz (51.5 kg)-134 lb 9.6 oz (61.054 kg)] 133 lb 4.8 oz (60.464 kg) (07/14 0509) Last BM Date: 04/02/12  Intake/Output from previous day: 07/13 0701 - 07/14 0700 In: 37.5 [I.V.:37.5] Out: 1586 [Emesis/NG output:100] Intake/Output this shift: Total I/O In: 520.8 [I.V.:520.8] Out: 400 [Emesis/NG output:400]  Alert, NAD, flat affect cta Reg Soft, min distension, some scattered BS, multiple incisions, non-tender; no RT/guarding  Lab Results:   Basename 04/04/12 0505 04/03/12 1100  WBC 13.6* 9.1  HGB 16.7 16.2  HCT 50.4 48.7  PLT 118* 89*   BMET  Basename 04/03/12 1815 04/03/12 1100 04/02/12 1918  NA 140 -- 142  K 4.7 -- 5.0  CL 94* -- 95*  CO2 27 -- 28  GLUCOSE 94 -- 116*  BUN 59* -- 45*  CREATININE 12.77* 12.42* --  CALCIUM 10.0 -- 11.3*   PT/INR No results found for this basename: LABPROT:2,INR:2 in the last 72 hours ABG No results found for this basename: PHART:2,PCO2:2,PO2:2,HCO3:2 in the last 72 hours  Studies/Results: Ct Abdomen Pelvis Wo Contrast  04/03/2012  *RADIOLOGY REPORT*  Clinical Data: Abdominal pain  CT ABDOMEN AND PELVIS WITHOUT CONTRAST  Technique:  Multidetector CT imaging of the abdomen and pelvis was performed following the standard protocol without intravenous contrast.  Comparison: 11/01/2004  Findings: Coronary artery calcification.  Linear opacity at the left lung base.  Organ abnormality/lesion detection is limited in the absence of intravenous contrast. Within this limitation, unremarkable liver, biliary system, spleen, adrenal glands. There is dilatation of the main  pancreatic duct, measuring up to 5 mm.  Atrophic native kidneys with bilateral cystic lesions of varying complexity.  Transplant kidney left lower quadrant with sinus lipomatosis and cortical thinning.  There is a loop of small bowel distended up to 3.3 cm and demonstrating air fluid level.  Distal small bowel is decompressed.  Colonic diverticulosis.  No CT evidence for diverticulitis  No free intraperitoneal air or fluid.  No lymphadenopathy.  Advanced atherosclerotic disease of the aorta and branch vessels.  Decompressed bladder.  Surgical clips in the pelvis.  Partially imaged left femoral bypass.  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 Unchanged cystic and sclerotic changes of the right femoral neck.  No acute osseous finding.  IMPRESSION: Dilated small bowel loops up to 3.3 cm with air-fluid levels. Decompressed bowel loops distally.  This may reflect an early small bowel obstruction.  Recommend correlation with symptoms and if no intervention, follow-up KUB to document passage of contrast into the colon.  Advanced atherosclerotic disease.  Transplant kidney left lower quadrant with prominent renal sinus fat, similar to prior.  Bilateral native kidneys with incompletely characterized cystic lesions of varying complexity.  Consider follow-up renal MRI.  Dilated main pancreatic duct up to 5 mm.  No obstructing lesion visualized by CT.  This can also be better characterized at the time of MR.  Original Report Authenticated By: Waneta Martins, M.D.    Anti-infectives: Anti-infectives    None      Assessment/Plan: ESRD HTN pSBO - although WBC elevated this am, pt is less distended, has  less pain, and is non-tender on exam; therefore will cont non-surgical mgmt for now; bowel rest, NPO, ng tube decompression; repeat abd xray in AM  [name]. Andrey Campanile, MD, FACS General, Bariatric, & Minimally Invasive Surgery [facility], [place]   LOS: 2 days    [name] 04/04/2012

## 2012-04-04 NOTE — Progress Notes (Signed)
I have personally seen and examined this patient and agree with the assessment/plan as outlined above by [name] PA. No reported BM or flatus yet, conservative care with bowel rest/gastric decompression to continue. On gentle IVFs as intake weight was inaccurate. Standing weight at HD yesterday was 52.5Kg. Johnnell Liou K.,MD 04/04/2012 10:10 AM

## 2012-04-04 NOTE — Progress Notes (Signed)
Patient Blood pressure 164/84 bat about 0643 this morning follow up after 5mg  labetalol was given.will continue to monitor patient.

## 2012-04-04 NOTE — Progress Notes (Signed)
Patient Blood pressure 214/105,callahan,NP was notified.PRN Hydralazine 10mg  was ordered and given, rechecked BP 200/100,NP callahan notified again, labetalol 5mg  iv was ordered and given .Will continue to monitor patient.

## 2012-04-04 NOTE — Progress Notes (Signed)
Subjective:  No current complaints, NG tube uncomfortable, but pain is controlled.  Objective: Vital signs in last 24 hours: Temp:  [97.5 F (36.4 C)-98.4 F (36.9 C)] 98.4 F (36.9 C) (07/14 0509) Pulse Rate:  [62-88] 88  (07/14 0509) Resp:  [12-20] 18  (07/14 0509) BP: (155-224)/(84-129) 188/88 mmHg (07/14 0509) SpO2:  [95 %-99 %] 97 % (07/14 0509) Weight:  [51.5 kg (113 lb 8.6 oz)-61.054 kg (134 lb 9.6 oz)] 60.464 kg (133 lb 4.8 oz) (07/14 0509) Weight change:   Intake/Output from previous day: 07/13 0701 - 07/14 0700 In: 37.5 [I.V.:37.5] Out: 1486    EXAM: General appearance:  Alert, reserved, in no apparent distress Resp:  CTA bilaterally without rales, rhonchi, or wheezes Cardio:  RRR without murmur GI:  Decreased BS, mildly distended, soft with mild tenderness to palpation Extremities:  No edema Access:  Left thigh AVG with + bruit  Lab Results:  Basename 04/04/12 0505 04/03/12 1100  WBC 13.6* 9.1  HGB 16.7 16.2  HCT 50.4 48.7  PLT 118* 89*   BMET:  Basename 04/03/12 1815 04/03/12 1100 04/02/12 1918  NA 140 -- 142  K 4.7 -- 5.0  CL 94* -- 95*  CO2 27 -- 28  GLUCOSE 94 -- 116*  BUN 59* -- 45*  CREATININE 12.77* 12.42* --  CALCIUM 10.0 -- 11.3*  ALBUMIN 3.6 -- 4.2   No results found for this basename: PTH:2 in the last 72 hours Iron Studies: No results found for this basename: IRON,TIBC,TRANSFERRIN,FERRITIN in the last 72 hours  Assessment/Plan: 1. Abdominal pain/ nausea and vomiting - CT abdomen suggests small bowel obstruction; history of multiple abdominal surgeries; currently with NG tube, pain controlled on narcotics; if no improvement in the next 24-48 hrs, may require laparotomy per Surgery. 2. ESRD - HD (since '04) @ [facility] on TTS, per-HD K 4.7 yesterday. 3. Hypertension/volume - BP currently elevated at 188/88, but better s/p HD, no outpatient meds; current wt of 60.4 kg, but 51.5 yesterday (net UF 1486 ml yesterday), although outpatient EDW 52.5 kg;  initially on NS @ 75 ml/hr, which was dc'd. 4. Anemia - Hgb 16.7, no Epo or Fe. 5. Metabolic bone disease - Ca 10 (10.3 corrected) on 2Ca bath, P 6, on Fosrenol 1 g with meals and Phoslo 3 with meals, 2 with snacks as outpatient.  6. Nutrition - Last Alb 3.6. 7. S/p renal transplant - at [facility] in '87, started HD in 04/2003, remains on Prednisone. 8. History of prostate cancer - s/p prostatectomy @ [facility] in 09/2010. 9. History of gout - on Colcrys qd, allergic to Allopurinol.  10. Hepatitis C 1.     LOS: 2 days   Zenia Guest 04/04/2012,7:55 AM

## 2012-04-04 NOTE — Progress Notes (Signed)
[facility] PROGRESS NOTE  [name] YNW:[id number] DOB: 1961/02/24 DOA: 04/02/2012   Assessment/Plan: Patient Active Hospital Problem List: SBO (small bowel obstruction) (04/03/2012) -his white count is slightly elevated, he relates no abdominal pain his abdomen not distended or tender to physical examination, continue patient n.p.o. continue conservative management. -Surgery recommended to continue medical management x-ray in the morning.  ESRD (end stage renal disease) (04/03/2012) -had dialysis 04/03/2012. Renal will continue to follow.   Elevated blood pressure without diagnosis of hypertension  -he is currently on no blood pressure medications at home, here he has obviously been hypertensive. His blood pressure has not been lower than 179/104. He has been getting hydralazine and albuterol when necessary. Going to start him on Norvasc 5 mg daily, and continue monitor his blood pressure. -Continue labetalol and hydralazine when necessary.  Erythrocytosis (04/03/2012)  hours this is probably secondary to hemoconcentration.    Code Status: Full code  Family Communication: Sister [phone]  Disposition Plan: To be determined    LOS: 2 days   Procedures:  NG tube 04/03/2012  Antibiotics:  None   Subjective: Patient not very talkative this morning. He relates the NG tube is bothering him quite a bit. Otherwise he relates his abdominal pain is much improved. Is not had a bowel movement or pass gas.  Objective: Filed Vitals:   04/03/12 2004 04/03/12 2020 04/03/12 2130 04/04/12 0509  BP: 188/103 183/98 195/95 188/88  Pulse: 75 73 71 88  Temp: 97.5 F (36.4 C)  98.3 F (36.8 C) 98.4 F (36.9 C)  TempSrc: Oral  Oral Oral  Resp: 16 15 18 18   Height:      Weight:  51.5 kg (113 lb 8.6 oz)  60.464 kg (133 lb 4.8 oz)  SpO2:   97% 97%    Intake/Output Summary (Last 24 hours) at 04/04/12 0951 Last data filed at 04/04/12 0750  Gross per 24 hour  Intake 558.33  ml  Output   1986 ml  Net -1427.67 ml   Weight change:   Exam:  General: Alert, awake, oriented x3, in no acute distress.  HEENT: No bruits, no goiter. NG tube. Heart: Regular rate and rhythm, without murmurs, rubs, gallops.  Lungs: Good air movement, bilateral air movement.  Abdomen: Soft, nontender, nondistended, positive bowel sounds.     Data Reviewed: Basic Metabolic Panel:  Lab 04/03/12 5284 04/03/12 1100 04/02/12 1918  NA 140 -- 142  K 4.7 -- 5.0  CL 94* -- 95*  CO2 27 -- 28  GLUCOSE 94 -- 116*  BUN 59* -- 45*  CREATININE 12.77* 12.42* 11.16*  CALCIUM 10.0 -- 11.3*  MG -- -- --  PHOS 6.0* -- --   Liver Function Tests:  Lab 04/03/12 1815 04/02/12 1918  AST -- 35  ALT -- 23  ALKPHOS -- 59  BILITOT -- 0.5  PROT -- 8.0  ALBUMIN 3.6 4.2   No results found for this basename: LIPASE:5,AMYLASE:5 in the last 168 hours No results found for this basename: AMMONIA:5 in the last 168 hours CBC:  Lab 04/04/12 0505 04/03/12 1100 04/02/12 1918  WBC 13.6* 9.1 6.4  NEUTROABS -- -- --  HGB 16.7 16.2 17.6*  HCT 50.4 48.7 52.5*  MCV 87.2 87.1 86.8  PLT 118* 89* 91*   Cardiac Enzymes: No results found for this basename: CKTOTAL:5,CKMB:5,CKMBINDEX:5,TROPONINI:5 in the last 168 hours BNP: No components found with this basename: POCBNP:5 CBG: No results found for this basename: GLUCAP:5 in the last 168 hours  Recent  Results (from the past 240 hour(s))  MRSA PCR SCREENING     Status: Normal   Collection Time   04/03/12 10:10 AM      Component Value Range Status Comment   MRSA by PCR NEGATIVE  NEGATIVE Final      Studies: Ct Abdomen Pelvis Wo Contrast  04/03/2012  *RADIOLOGY REPORT*  Clinical Data: Abdominal pain  CT ABDOMEN AND PELVIS WITHOUT CONTRAST  Technique:  Multidetector CT imaging of the abdomen and pelvis was performed following the standard protocol without intravenous contrast.  Comparison: 11/01/2004  Findings: Coronary artery calcification.  Linear  opacity at the left lung base.  Organ abnormality/lesion detection is limited in the absence of intravenous contrast. Within this limitation, unremarkable liver, biliary system, spleen, adrenal glands. There is dilatation of the main pancreatic duct, measuring up to 5 mm.  Atrophic native kidneys with bilateral cystic lesions of varying complexity.  Transplant kidney left lower quadrant with sinus lipomatosis and cortical thinning.  There is a loop of small bowel distended up to 3.3 cm and demonstrating air fluid level.  Distal small bowel is decompressed.  Colonic diverticulosis.  No CT evidence for diverticulitis  No free intraperitoneal air or fluid.  No lymphadenopathy.  Advanced atherosclerotic disease of the aorta and branch vessels.  Decompressed bladder.  Surgical clips in the pelvis.  Partially imaged left femoral bypass.  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 Unchanged cystic and sclerotic changes of the right femoral neck.  No acute osseous finding.  IMPRESSION: Dilated small bowel loops up to 3.3 cm with air-fluid levels. Decompressed bowel loops distally.  This may reflect an early small bowel obstruction.  Recommend correlation with symptoms and if no intervention, follow-up KUB to document passage of contrast into the colon.  Advanced atherosclerotic disease.  Transplant kidney left lower quadrant with prominent renal sinus fat, similar to prior.  Bilateral native kidneys with incompletely characterized cystic lesions of varying complexity.  Consider follow-up renal MRI.  Dilated main pancreatic duct up to 5 mm.  No obstructing lesion visualized by CT.  This can also be better characterized at the time of MR.  Original Report Authenticated By: Waneta Martins, M.D.    Scheduled Meds:   . calcium acetate  667 mg Oral TID WC  . diphenhydrAMINE  12.5 mg Intravenous Once  . heparin  5,000 Units Subcutaneous Q8H  . labetalol  5 mg Intravenous Once  . labetalol  5 mg Intravenous  Once  . predniSONE  5 mg Oral Q breakfast   Continuous Infusions:   . dextrose 5 % and 0.9% NaCl 50 mL/hr at 04/03/12 2125  . DISCONTD: sodium chloride 75 mL/hr at 04/03/12 1030    Lambert Keto, MD  [facility] Pager [phone]  If 7PM-7AM, please contact night-coverage [email or web address] Password [facility] 04/04/2012, 9:51 AM

## 2012-04-05 ENCOUNTER — Inpatient Hospital Stay (HOSPITAL_COMMUNITY): Payer: Medicare Other

## 2012-04-05 MED ORDER — LABETALOL HCL 5 MG/ML IV SOLN
5.0000 mg | Freq: Once | INTRAVENOUS | Status: AC
  Administered 2012-04-05: 5 mg via INTRAVENOUS
  Filled 2012-04-05 (×2): qty 4

## 2012-04-05 MED ORDER — PHENOL 1.4 % MT LIQD
1.0000 | OROMUCOSAL | Status: DC | PRN
  Filled 2012-04-05: qty 177

## 2012-04-05 MED ORDER — AMLODIPINE BESYLATE 10 MG PO TABS
10.0000 mg | ORAL_TABLET | Freq: Every day | ORAL | Status: DC
  Administered 2012-04-06 – 2012-04-07 (×2): 10 mg via ORAL
  Filled 2012-04-05 (×4): qty 1

## 2012-04-05 MED ORDER — AMLODIPINE BESYLATE 10 MG PO TABS
10.0000 mg | ORAL_TABLET | Freq: Every day | ORAL | Status: DC
  Administered 2012-04-05: 10 mg via ORAL
  Filled 2012-04-05: qty 1

## 2012-04-05 NOTE — Progress Notes (Signed)
  Subjective: Stomach with no pain feels better, no flatus or bm though  Objective: Vital signs in last 24 hours: Temp:  [98.3 F (36.8 C)-98.8 F (37.1 C)] 98.3 F (36.8 C) (07/15 0514) Pulse Rate:  [72-83] 76  (07/15 0514) Resp:  [17-18] 17  (07/15 0514) BP: (180-214)/(82-99) 180/90 mmHg (07/15 0645) SpO2:  [95 %-98 %] 95 % (07/15 0514) Weight:  [135 lb 12.8 oz (61.598 kg)] 135 lb 12.8 oz (61.598 kg) (07/15 0514) Last BM Date: 04/02/12  Intake/Output from previous day: 07/14 0701 - 07/15 0700 In: 1007.5 [I.V.:1007.5] Out: 200 [Emesis/NG output:200] Intake/Output this shift:    General appearance: no distress GI: multiple healed incisions, few bs, nontender nondistended, right buttock has chronic perianal granulation tissue likely from old abscess  Lab Results:   Basename 04/04/12 0505 04/03/12 1100  WBC 13.6* 9.1  HGB 16.7 16.2  HCT 50.4 48.7  PLT 118* 89*   BMET  Basename 04/03/12 1815 04/03/12 1100 04/02/12 1918  NA 140 -- 142  K 4.7 -- 5.0  CL 94* -- 95*  CO2 27 -- 28  GLUCOSE 94 -- 116*  BUN 59* -- 45*  CREATININE 12.77* 12.42* --  CALCIUM 10.0 -- 11.3*   PT/INR No results found for this basename: LABPROT:2,INR:2 in the last 72 hours ABG No results found for this basename: PHART:2,PCO2:2,PO2:2,HCO3:2 in the last 72 hours  Studies/Results: Abd 1 View (kub)  04/05/2012  *RADIOLOGY REPORT*  Clinical Data: Follow-up small bowel obstruction.  ABDOMEN - 1 VIEW  Comparison: 04/03/2012  Findings: Dilated lower abdominal small bowel loops are again noted compatible with small bowel obstruction.  Likely no significant change since prior CT.  No free air.  Surgical clips in the pelvis. No acute bony abnormality.  IMPRESSION: Stable small bowel dilatation in the lower abdomen, likely no significant change.  Original Report Authenticated By: Cyndie Chime, M.D.    Anti-infectives: Anti-infectives    None      Assessment/Plan: SBO likely adhesive  Would  continue conservative care for another 24-48 hours to see if he improves. We discussed possibility of operation if does not resolve.   LOS: 3 days    [facility] 04/05/2012

## 2012-04-05 NOTE — Progress Notes (Signed)
[facility] PROGRESS NOTE  [name] AVW:[id number] DOB: 1961/01/03 DOA: 04/02/2012   Assessment/Plan: Patient Active Hospital Problem List: SBO (small bowel obstruction) (04/03/2012) -he relates no abdominal pain his abdomen not distended or tender to physical examination, continue patient n.p.o. continue conservative management. He has not passed any gas or stools. -Abdominal x-ray pending. He is tolerating Lasix. -Appreciate surgeries assistance.  ESRD (end stage renal disease) (04/03/2012) -had dialysis 04/03/2012. Renal will continue to follow.   Elevated blood pressure without diagnosis of hypertension  -he is currently on no blood pressure medications at home, here he has obviously been hypertensive. His blood pressure has not been lower than 179/104. He has been getting hydralazine and albuterol when necessary.  -Increase Norvasc. -Continue labetalol and hydralazine when necessary.  Erythrocytosis (04/03/2012)  hours this is probably secondary to hemoconcentration.    Code Status: Full code  Family Communication: Sister [phone]  Disposition Plan: To be determined    LOS: 3 days   Procedures:  NG tube 04/03/2012  Antibiotics:  None   Subjective: Patient not very talkative this morning. He relates the NG tube is bothering him quite a bit.  -He relates he has not passed gas or had a bowel movement tolerating ice chips really well - Objective: Filed Vitals:   04/04/12 2129 04/05/12 0112 04/05/12 0514 04/05/12 0645  BP: 214/95 184/82 209/99 180/90  Pulse: 73 83 76   Temp: 98.8 F (37.1 C)  98.3 F (36.8 C)   TempSrc: Oral  Oral   Resp: 18  17   Height:      Weight:   61.598 kg (135 lb 12.8 oz)   SpO2: 97%  95%     Intake/Output Summary (Last 24 hours) at 04/05/12 0751 Last data filed at 04/04/12 1734  Gross per 24 hour  Intake 486.67 ml  Output    200 ml  Net 286.67 ml   Weight change: 0.544 kg (1 lb 3.2 oz)  Exam:  General: Alert,  awake, oriented x3, in no acute distress.  Heart: Regular rate and rhythm, without murmurs, rubs, gallops.  Lungs: Good air movement, clear to auscultation Abdomen: Soft, nontender, nondistended, positive bowel sounds.     Data Reviewed: Basic Metabolic Panel:  Lab 04/03/12 6578 04/03/12 1100 04/02/12 1918  NA 140 -- 142  K 4.7 -- 5.0  CL 94* -- 95*  CO2 27 -- 28  GLUCOSE 94 -- 116*  BUN 59* -- 45*  CREATININE 12.77* 12.42* 11.16*  CALCIUM 10.0 -- 11.3*  MG -- -- --  PHOS 6.0* -- --   Liver Function Tests:  Lab 04/03/12 1815 04/02/12 1918  AST -- 35  ALT -- 23  ALKPHOS -- 59  BILITOT -- 0.5  PROT -- 8.0  ALBUMIN 3.6 4.2   No results found for this basename: LIPASE:5,AMYLASE:5 in the last 168 hours No results found for this basename: AMMONIA:5 in the last 168 hours CBC:  Lab 04/04/12 0505 04/03/12 1100 04/02/12 1918  WBC 13.6* 9.1 6.4  NEUTROABS -- -- --  HGB 16.7 16.2 17.6*  HCT 50.4 48.7 52.5*  MCV 87.2 87.1 86.8  PLT 118* 89* 91*   Cardiac Enzymes: No results found for this basename: CKTOTAL:5,CKMB:5,CKMBINDEX:5,TROPONINI:5 in the last 168 hours BNP: No components found with this basename: POCBNP:5 CBG: No results found for this basename: GLUCAP:5 in the last 168 hours  Recent Results (from the past 240 hour(s))  MRSA PCR SCREENING     Status: Normal   Collection Time  04/03/12 10:10 AM      Component Value Range Status Comment   MRSA by PCR NEGATIVE  NEGATIVE Final      Studies: Ct Abdomen Pelvis Wo Contrast  04/03/2012  *RADIOLOGY REPORT*  Clinical Data: Abdominal pain  CT ABDOMEN AND PELVIS WITHOUT CONTRAST  Technique:  Multidetector CT imaging of the abdomen and pelvis was performed following the standard protocol without intravenous contrast.  Comparison: 11/01/2004  Findings: Coronary artery calcification.  Linear opacity at the left lung base.  Organ abnormality/lesion detection is limited in the absence of intravenous contrast. Within this  limitation, unremarkable liver, biliary system, spleen, adrenal glands. There is dilatation of the main pancreatic duct, measuring up to 5 mm.  Atrophic native kidneys with bilateral cystic lesions of varying complexity.  Transplant kidney left lower quadrant with sinus lipomatosis and cortical thinning.  There is a loop of small bowel distended up to 3.3 cm and demonstrating air fluid level.  Distal small bowel is decompressed.  Colonic diverticulosis.  No CT evidence for diverticulitis  No free intraperitoneal air or fluid.  No lymphadenopathy.  Advanced atherosclerotic disease of the aorta and branch vessels.  Decompressed bladder.  Surgical clips in the pelvis.  Partially imaged left femoral bypass.  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 Unchanged cystic and sclerotic changes of the right femoral neck.  No acute osseous finding.  IMPRESSION: Dilated small bowel loops up to 3.3 cm with air-fluid levels. Decompressed bowel loops distally.  This may reflect an early small bowel obstruction.  Recommend correlation with symptoms and if no intervention, follow-up KUB to document passage of contrast into the colon.  Advanced atherosclerotic disease.  Transplant kidney left lower quadrant with prominent renal sinus fat, similar to prior.  Bilateral native kidneys with incompletely characterized cystic lesions of varying complexity.  Consider follow-up renal MRI.  Dilated main pancreatic duct up to 5 mm.  No obstructing lesion visualized by CT.  This can also be better characterized at the time of MR.  Original Report Authenticated By: Waneta Martins, M.D.    Scheduled Meds:    . amLODipine  5 mg Oral Daily  . calcium acetate  667 mg Oral TID WC  . diphenhydrAMINE  12.5 mg Intravenous Once  . heparin  5,000 Units Subcutaneous Q8H  . labetalol  5 mg Intravenous Once  . labetalol  5 mg Intravenous Once  . predniSONE  5 mg Oral Q breakfast   Continuous Infusions:    . dextrose 5 % and  0.9% NaCl 50 mL/hr at 04/04/12 1511    Lambert Keto, MD  [facility] Pager [phone]  If 7PM-7AM, please contact night-coverage [email or web address] Password [facility] 04/05/2012, 7:51 AM

## 2012-04-05 NOTE — Clinical Documentation Improvement (Signed)
Hypertension Documentation Clarification Query  THIS DOCUMENT IS NOT A PERMANENT PART OF THE MEDICAL RECORD  TO RESPOND TO THE THIS QUERY, FOLLOW THE INSTRUCTIONS BELOW:  1. If needed, update documentation for the patient's encounter via the notes activity.  2. Access this query again and click edit on the In [employer].  3. After updating, or not, click F2 to complete all highlighted (required) fields concerning your review. Select "additional documentation in the medical record" OR "no additional documentation provided".  4. Click Sign note button.  5. The deficiency will fall out of your In Basket *Please let us know if you are not able to complete this workflow by phone or e-mail (listed below).        04/05/12  Dear Dr.  [facility]  In an effort to better capture your patient's severity of illness, reflect appropriate length of stay and utilization of resources, a review of the patient medical record has revealed the following indicators.    Based on your clinical judgment, please clarify and document in a progress note and/or discharge summary the clinical condition associated with the following supporting information:  In responding to this query please exercise your independent judgment.  The fact that a query is asked, does not imply that any particular answer is desired or expected.  Possible Clinical Conditions   Accelerated Hypertension  Malignant Hypertension  Or Other Condition  Cannot Clinically Determine   Supporting Information: Risk Factors:   ESRD s/p transplant on HD Hx HTN Abd pain  Signs and Symptoms: B/p's since admission:  210/96                                        223/101                                        214/96                                        224/114                                        209/99 on 7/15@ 5284  Treatment: Amlodipine changed from 5mg  po to 10mg  po daily @[facility]  Labetalol 5mg  IVP x 2  doses Hydralazine 10mg  IVP Q6 as needed (3 doses given) Monitoring of vital signs  You may use possible, probable, or suspect with inpatient documentation. Possible, probable, suspected diagnoses MUST be documented at the time of discharge.  Reviewed: Query partially answered  Thank You,    Rossie Muskrat RN, BSN Clinical Documentation Specialist Pager:  [phone] [email or web address]  Health Information Management [place]

## 2012-04-05 NOTE — Progress Notes (Signed)
Subjective:  Sitting up in recliner; uncomfortable with diffuse tender abd pain and sore throat from NGT  Vital signs in last 24 hours: Filed Vitals:   04/04/12 2129 04/05/12 0112 04/05/12 0514 04/05/12 0645  BP: 214/95 184/82 209/99 180/90  Pulse: 73 83 76   Temp: 98.8 F (37.1 C)  98.3 F (36.8 C)   TempSrc: Oral  Oral   Resp: 18  17   Height:      Weight:   61.598 kg (135 lb 12.8 oz)   SpO2: 97%  95%    Weight change: 0.544 kg (1 lb 3.2 oz)  Intake/Output Summary (Last 24 hours) at 04/05/12 1105 Last data filed at 04/04/12 1734  Gross per 24 hour  Intake 486.67 ml  Output    200 ml  Net 286.67 ml   Labs: Basic Metabolic Panel:  Lab 04/03/12 4098 04/03/12 1100 04/02/12 1918  NA 140 -- 142  K 4.7 -- 5.0  CL 94* -- 95*  CO2 27 -- 28  GLUCOSE 94 -- 116*  BUN 59* -- 45*  CREATININE 12.77* 12.42* 11.16*  CALCIUM 10.0 -- 11.3*  ALB -- -- --  PHOS 6.0* -- --   Liver Function Tests:  Lab 04/03/12 1815 04/02/12 1918  AST -- 35  ALT -- 23  ALKPHOS -- 59  BILITOT -- 0.5  PROT -- 8.0  ALBUMIN 3.6 4.2   No results found for this basename: LIPASE:3,AMYLASE:3 in the last 168 hours No results found for this basename: AMMONIA:3 in the last 168 hours CBC:  Lab 04/04/12 0505 04/03/12 1100 04/02/12 1918  WBC 13.6* 9.1 6.4  NEUTROABS -- -- --  HGB 16.7 16.2 17.6*  HCT 50.4 48.7 52.5*  MCV 87.2 87.1 86.8  PLT 118* 89* 91*   Cardiac Enzymes: No results found for this basename: CKTOTAL:5,CKMB:5,CKMBINDEX:5,TROPONINI:5 in the last 168 hours CBG: No results found for this basename: GLUCAP:5 in the last 168 hours  Iron Studies: No results found for this basename: IRON,TIBC,TRANSFERRIN,FERRITIN in the last 72 hours Studies/Results: Abd 1 View (kub)  04/05/2012  *RADIOLOGY REPORT*  Clinical Data: Follow-up small bowel obstruction.  ABDOMEN - 1 VIEW  Comparison: 04/03/2012  Findings: Dilated lower abdominal small bowel loops are again noted compatible with small bowel  obstruction.  Likely no significant change since prior CT.  No free air.  Surgical clips in the pelvis. No acute bony abnormality.  IMPRESSION: Stable small bowel dilatation in the lower abdomen, likely no significant change.  Original Report Authenticated By: Cyndie Chime, M.D.   Medications:    . dextrose 5 % and 0.9% NaCl 50 mL/hr at 04/04/12 1511      . amLODipine  10 mg Oral Daily  . calcium acetate  667 mg Oral TID WC  . diphenhydrAMINE  12.5 mg Intravenous Once  . heparin  5,000 Units Subcutaneous Q8H  . labetalol  5 mg Intravenous Once  . predniSONE  5 mg Oral Q breakfast  . DISCONTD: amLODipine  5 mg Oral Daily    I  have reviewed scheduled and prn medications.  Physical Exam: General: uncomfortable Heart: RRR Lungs: CTAB Abdomen: NGT intactdiffuse tenderness, hypoactive B.S. Extremities: no ankle edema Dialysis Access: left thigh AVG +T/B  Problem/Plan: 1. Abdominal pain/ nausea and vomiting - SBO per CT abdomen; KUB this am no significant change since CT; history of multiple abdominal surgeries; still has NG tube, pain controlled on narcotics; surgery following and suspects SBO likely adhesive (Dr. Dwain Sarna); plans ongoing conservative care;  await another 24-48 hrs prior to consideration surgery. Sore throat NGT; give prn Chloraseptic spray for comfort 2. ESRD - HD TTS at [facility]; plans for HD tomorrow; 2K bath with pre-HD K (K 4.7 on 7/13; but GI loss secondary NG tube). 3. Hypertension/volume - BP remains elevated and above goal at 184/82; no BP meds outpatient setting; now taking po meds (Norvasc10mg ) with nursing clamping NG tube; saline lock nonfunctional and IV Labetalol pending at this time; cont UF with HD (5-6L) noting wt 61.5 today for BP control (outpatient EDW 52.5 kg); needs f/u 4. Anemia - Hgb 16.7, no Epo or Fe. 5. Metabolic bone disease - Ca 10 (10.3 corrected), Phos 6.0; on 2Ca bath; NPO but on binders (Phoslo); noting SBO, will hold for now to decrease pill  burden; follow with repeat labs pending.  6. Nutrition - Last Alb 3.6; NPO; follow. 7. S/p renal transplant - at [facility] in '87, started HD in 04/2003, remains on Prednisone.  8. Hepatitis C/thrombocytopenia- plt ^ 118; no heparin with HD and follow 9. Disposition- per primary  Stephen Germany, FNP-C [facility] Pager [phone]  04/05/2012,11:05 AM  LOS: 3 days   Patient seen and examined and agree with assessment and plan as above.  Vinson Moselle  MD [employer] [phone] pgr    [phone] cell 04/05/2012, 3:24 PM

## 2012-04-05 NOTE — Progress Notes (Signed)
Patient Blood pressure continues to rise, NP calahaan notitied on 2 occasions during this shift, Hydralazine, and labetalol was ordered and given.The last BP 180/90 calahaan was made aware, and the Day nurse notified to give labetalol iv when received from pharmacy.

## 2012-04-06 ENCOUNTER — Inpatient Hospital Stay (HOSPITAL_COMMUNITY): Payer: Medicare Other

## 2012-04-06 LAB — RENAL FUNCTION PANEL
BUN: 68 mg/dL — ABNORMAL HIGH (ref 6–23)
CO2: 21 mEq/L (ref 19–32)
Calcium: 9.8 mg/dL (ref 8.4–10.5)
GFR calc Af Amer: 4 mL/min — ABNORMAL LOW (ref >90)
Glucose, Bld: 86 mg/dL (ref 70–99)
Phosphorus: 7.2 mg/dL — ABNORMAL HIGH (ref 2.3–4.6)
Potassium: 5.2 mEq/L — ABNORMAL HIGH (ref 3.5–5.1)
Sodium: 140 mEq/L (ref 135–145)

## 2012-04-06 LAB — CBC
HCT: 48.7 % (ref 39.0–52.0)
Hemoglobin: 16.1 g/dL (ref 13.0–17.0)
MCH: 28.4 pg (ref 26.0–34.0)
MCHC: 33.1 g/dL (ref 30.0–36.0)
RBC: 5.66 MIL/uL (ref 4.22–5.81)

## 2012-04-06 MED ORDER — LABETALOL HCL 100 MG PO TABS
100.0000 mg | ORAL_TABLET | Freq: Two times a day (BID) | ORAL | Status: DC
  Administered 2012-04-06 – 2012-04-08 (×3): 100 mg via ORAL
  Filled 2012-04-06 (×6): qty 1

## 2012-04-06 MED ORDER — PENTAFLUOROPROP-TETRAFLUOROETH EX AERO
INHALATION_SPRAY | CUTANEOUS | Status: AC
  Administered 2012-04-06: 13:00:00
  Filled 2012-04-06: qty 103.5

## 2012-04-06 NOTE — Progress Notes (Signed)
No flatus yet but feels rumbling, abdomen soft, nontender has bs present Will check films today If he is not better by tomorrow will need to consider exploration.

## 2012-04-06 NOTE — Progress Notes (Signed)
Subjective:  Up in chair, no flatus, walking in halls, feeling better  Vital signs in last 24 hours: Filed Vitals:   04/05/12 1440 04/05/12 2118 04/05/12 2119 04/06/12 0454  BP: 167/92 216/101 198/96 175/93  Pulse:   95 101  Temp:   97.9 F (36.6 C) 98.6 F (37 C)  TempSrc:   Oral Oral  Resp:   20 18  Height:      Weight:    58.9 kg (129 lb 13.6 oz)  SpO2:   96% 93%   Weight change: -2.698 kg (-5 lb 15.2 oz)  Intake/Output Summary (Last 24 hours) at 04/06/12 1224 Last data filed at 04/06/12 0850  Gross per 24 hour  Intake      0 ml  Output    300 ml  Net   -300 ml   Labs: Basic Metabolic Panel:  Lab 04/03/12 1610 04/03/12 1100 04/02/12 1918  NA 140 -- 142  K 4.7 -- 5.0  CL 94* -- 95*  CO2 27 -- 28  GLUCOSE 94 -- 116*  BUN 59* -- 45*  CREATININE 12.77* 12.42* 11.16*  CALCIUM 10.0 -- 11.3*  ALB -- -- --  PHOS 6.0* -- --   Liver Function Tests:  Lab 04/03/12 1815 04/02/12 1918  AST -- 35  ALT -- 23  ALKPHOS -- 59  BILITOT -- 0.5  PROT -- 8.0  ALBUMIN 3.6 4.2   No results found for this basename: LIPASE:3,AMYLASE:3 in the last 168 hours No results found for this basename: AMMONIA:3 in the last 168 hours CBC:  Lab 04/06/12 0544 04/04/12 0505 04/03/12 1100 04/02/12 1918  WBC 10.1 13.6* 9.1 --  NEUTROABS -- -- -- --  HGB 16.1 16.7 16.2 --  HCT 48.7 50.4 48.7 --  MCV 86.0 87.2 87.1 86.8  PLT 87* 118* 89* --   Cardiac Enzymes: No results found for this basename: CKTOTAL:5,CKMB:5,CKMBINDEX:5,TROPONINI:5 in the last 168 hours CBG: No results found for this basename: GLUCAP:5 in the last 168 hours  Iron Studies: No results found for this basename: IRON,TIBC,TRANSFERRIN,FERRITIN in the last 72 hours Studies/Results: Abd 1 View (kub)  04/05/2012  *RADIOLOGY REPORT*  Clinical Data: Follow-up small bowel obstruction.  ABDOMEN - 1 VIEW  Comparison: 04/03/2012  Findings: Dilated lower abdominal small bowel loops are again noted compatible with small bowel  obstruction.  Likely no significant change since prior CT.  No free air.  Surgical clips in the pelvis. No acute bony abnormality.  IMPRESSION: Stable small bowel dilatation in the lower abdomen, likely no significant change.  Original Report Authenticated By: Cyndie Chime, M.D.   Dg Abd 2 Views  04/06/2012  *RADIOLOGY REPORT*  Clinical Data: Small bowel obstruction.  Abdominal pain.  ABDOMEN - 2 VIEW  Comparison: Radiographs dated 04/05/2012 and CT scan dated 04/03/2012  Findings: There is persistent dilatation of multiple small bowel loops.  The contrast given for the prior CT scan remains in these dilated small bowel loops.  No contrast in the nondistended colon.  No free air in the upright radiograph.  IMPRESSION: Persistent small bowel obstruction, unchanged.  Original Report Authenticated By: Gwynn Burly, M.D.   Medications:      . amLODipine  10 mg Oral QHS  . diphenhydrAMINE  12.5 mg Intravenous Once  . heparin  5,000 Units Subcutaneous Q8H  . labetalol  100 mg Oral BID  . predniSONE  5 mg Oral Q breakfast    I  have reviewed scheduled and prn medications.  Physical Exam:  General: uncomfortable Heart: RRR Lungs: CTAB Abdomen: NGT intactdiffuse tenderness, hypoactive B.S. Extremities: no ankle edema Dialysis Access: left thigh AVG +T/B  Dialysis Orders: Center: [facility] on TTS.  EDW 52.5 kg HD Bath 2K/2Ca Time 3.5 hrs Heparin 5000 U. Access AVG @ left thigh BFR 400 DFR 800  Zemplar 0 mcg IV/HD Epogen 0 Units IV/HD Venofer 0.   Problem/Plan: 1. Abdominal pain/ nausea and vomiting - SBO per CT abdomen; KUB this am no significant change since CT; history of multiple abdominal surgeries; still has NG tube, pain controlled on narcotics; surgery following and suspects SBO likely adhesive (Dr. Dwain Sarna). Per surg/primary. 2. ESRD - HD TTS at [facility]; plans for HD today; 2K bath with pre-HD K (K 4.7 on 7/13; but GI loss secondary NG tube). 3. Hypertension/volume - BP remains elevated  and above goal at 184/82; no BP meds outpatient setting; now taking po meds (Norvasc10mg ) with nursing clamping NG tube; saline lock nonfunctional and IV Labetalol pending at this time; still 7kg over dry weight, UF as much as tolerated today with HD. 4. Anemia - Hgb 16.7, no Epo or Fe. 5. Metabolic bone disease - Ca 10 (10.3 corrected), Phos 6.0; on 2Ca bath, no vit D; NPO but on binders (Phoslo); noting SBO, will hold for now to decrease pill burden; follow with repeat labs pending.  6. Nutrition - Last Alb 3.6; NPO; follow. 7. S/p renal transplant - at [facility] in '87, started HD in 04/2003, remains on Prednisone.  8. Hepatitis C/thrombocytopenia- plt ^ 118; no heparin with HD and follow 9. Disposition- per primary  Patient seen and examined and agree with assessment and plan as above.  Stephen Moselle  MD [facility] [phone] pgr    [phone] cell 04/06/2012, 12:24 PM

## 2012-04-06 NOTE — Progress Notes (Signed)
Patient ID: [name], male   DOB: 1961-05-10, 51 y.o.   MRN: [id number]    Subjective: Generally feels better today, still no BM, or flatus. NG remains.  Objective: Vital signs in last 24 hours: Temp:  [97.9 F (36.6 C)-98.6 F (37 C)] 98.6 F (37 C) (07/16 0454) Pulse Rate:  [82-101] 101  (07/16 0454) Resp:  [18-20] 18  (07/16 0454) BP: (167-216)/(92-117) 175/93 mmHg (07/16 0454) SpO2:  [93 %-97 %] 93 % (07/16 0454) Weight:  [129 lb 13.6 oz (58.9 kg)] 129 lb 13.6 oz (58.9 kg) (07/16 0454) Last BM Date: 04/02/12  Intake/Output from previous day: 07/15 0701 - 07/16 0700 In: 0  Out: 300 [Emesis/NG output:300] Intake/Output this shift:    General: no distress Abdomen: no bloating, non tender, no BS, no flatus Extremities: no edema  Lab Results:   [facility] 04/06/12 0544 04/04/12 0505  WBC 10.1 13.6*  HGB 16.1 16.7  HCT 48.7 50.4  PLT 87* 118*   BMET  Basename 04/03/12 1815 04/03/12 1100  NA 140 --  K 4.7 --  CL 94* --  CO2 27 --  GLUCOSE 94 --  BUN 59* --  CREATININE 12.77* 12.42*  CALCIUM 10.0 --   PT/INR No results found for this basename: LABPROT:2,INR:2 in the last 72 hours ABG No results found for this basename: PHART:2,PCO2:2,PO2:2,HCO3:2 in the last 72 hours  Studies/Results: Abd 1 View (kub)  04/05/2012  *RADIOLOGY REPORT*  Clinical Data: Follow-up small bowel obstruction.  ABDOMEN - 1 VIEW  Comparison: 04/03/2012  Findings: Dilated lower abdominal small bowel loops are again noted compatible with small bowel obstruction.  Likely no significant change since prior CT.  No free air.  Surgical clips in the pelvis. No acute bony abnormality.  IMPRESSION: Stable small bowel dilatation in the lower abdomen, likely no significant change.  Original Report Authenticated By: Cyndie Chime, M.D.    Anti-infectives: Anti-infectives    None      Assessment/Plan: SBO likely adhesive Will repeat Abdominal films today Continue conservative care for another  24-48 hours to see if he improves.  Possible surgical intervention if condition does not resolve as per Dr. Doreen Salvage conversation with patient yesterday.   LOS: 4 days    Paxtyn Boyar 04/06/2012

## 2012-04-06 NOTE — Progress Notes (Signed)
[facility] PROGRESS NOTE  [name] AVW:[id number] DOB: [birth year] DOA: 04/02/2012   Assessment/Plan: Patient Active Hospital Problem List: SBO (small bowel obstruction) (04/03/2012) -continue patient n.p.o. continue conservative management. He has not passed any gas or stools. -Abdominal x-ray tomorrow in the morning. He is tolerating Lasix. -Appreciate surgeries assistance.not better by tomorrow will need to consider exploration  ESRD (end stage renal disease) (04/03/2012) -had dialysis 04/03/2012. Renal will continue to follow.   Elevated blood pressure without diagnosis of hypertension  -he is currently on no blood pressure medications at home, here he has obviously been hypertensive. His blood pressure has not been lower than 179/104. He has been getting hydralazine and albuterol when necessary.  -Start  Labetalol, continue Norvasc. -Continue labetalol and hydralazine when necessary.  Erythrocytosis (04/03/2012)  hours this is probably secondary to hemoconcentration.    Code Status: Full code  Family Communication: Sister [phone]  Disposition Plan: To be determined    LOS: 4 days   Procedures:  NG tube 04/03/2012  Antibiotics:  None   Subjective:  He relates the NG tube is bothering him quite a bit.  -He relates he has not passed gas or had a bowel movement tolerating ice chips really well - Objective: Filed Vitals:   04/05/12 1440 04/05/12 2118 04/05/12 2119 04/06/12 0454  BP: 167/92 216/101 198/96 175/93  Pulse:   95 101  Temp:   97.9 F (36.6 C) 98.6 F (37 C)  TempSrc:   Oral Oral  Resp:   20 18  Height:      Weight:    58.9 kg (129 lb 13.6 oz)  SpO2:   96% 93%    Intake/Output Summary (Last 24 hours) at 04/06/12 1002 Last data filed at 04/06/12 0850  Gross per 24 hour  Intake      0 ml  Output    300 ml  Net   -300 ml   Weight change: -2.698 kg (-5 lb 15.2 oz)  Exam:  General: Alert, awake, oriented x3, in no acute distress.   Heart: Regular rate and rhythm, without murmurs, rubs, gallops.  Lungs: Good air movement, clear to auscultation Abdomen: Soft, nontender, nondistended, positive bowel sounds.     Data Reviewed: Basic Metabolic Panel:  Lab 04/03/12 6578 04/03/12 1100 04/02/12 1918  NA 140 -- 142  K 4.7 -- 5.0  CL 94* -- 95*  CO2 27 -- 28  GLUCOSE 94 -- 116*  BUN 59* -- 45*  CREATININE 12.77* 12.42* 11.16*  CALCIUM 10.0 -- 11.3*  MG -- -- --  PHOS 6.0* -- --   Liver Function Tests:  Lab 04/03/12 1815 04/02/12 1918  AST -- 35  ALT -- 23  ALKPHOS -- 59  BILITOT -- 0.5  PROT -- 8.0  ALBUMIN 3.6 4.2   No results found for this basename: LIPASE:5,AMYLASE:5 in the last 168 hours No results found for this basename: AMMONIA:5 in the last 168 hours CBC:  Lab 04/06/12 0544 04/04/12 0505 04/03/12 1100 04/02/12 1918  WBC 10.1 13.6* 9.1 6.4  NEUTROABS -- -- -- --  HGB 16.1 16.7 16.2 17.6*  HCT 48.7 50.4 48.7 52.5*  MCV 86.0 87.2 87.1 86.8  PLT 87* 118* 89* 91*   Cardiac Enzymes: No results found for this basename: CKTOTAL:5,CKMB:5,CKMBINDEX:5,TROPONINI:5 in the last 168 hours BNP: No components found with this basename: POCBNP:5 CBG: No results found for this basename: GLUCAP:5 in the last 168 hours  Recent Results (from the past 240 hour(s))  MRSA PCR SCREENING  Status: Normal   Collection Time   04/03/12 10:10 AM      Component Value Range Status Comment   MRSA by PCR NEGATIVE  NEGATIVE Final      Studies: Ct Abdomen Pelvis Wo Contrast  04/03/2012  *RADIOLOGY REPORT*  Clinical Data: Abdominal pain  CT ABDOMEN AND PELVIS WITHOUT CONTRAST  Technique:  Multidetector CT imaging of the abdomen and pelvis was performed following the standard protocol without intravenous contrast.  Comparison: 11/01/2004  Findings: Coronary artery calcification.  Linear opacity at the left lung base.  Organ abnormality/lesion detection is limited in the absence of intravenous contrast. Within this  limitation, unremarkable liver, biliary system, spleen, adrenal glands. There is dilatation of the main pancreatic duct, measuring up to 5 mm.  Atrophic native kidneys with bilateral cystic lesions of varying complexity.  Transplant kidney left lower quadrant with sinus lipomatosis and cortical thinning.  There is a loop of small bowel distended up to 3.3 cm and demonstrating air fluid level.  Distal small bowel is decompressed.  Colonic diverticulosis.  No CT evidence for diverticulitis  No free intraperitoneal air or fluid.  No lymphadenopathy.  Advanced atherosclerotic disease of the aorta and branch vessels.  Decompressed bladder.  Surgical clips in the pelvis.  Partially imaged left femoral bypass.  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 Unchanged cystic and sclerotic changes of the right femoral neck.  No acute osseous finding.  IMPRESSION: Dilated small bowel loops up to 3.3 cm with air-fluid levels. Decompressed bowel loops distally.  This may reflect an early small bowel obstruction.  Recommend correlation with symptoms and if no intervention, follow-up KUB to document passage of contrast into the colon.  Advanced atherosclerotic disease.  Transplant kidney left lower quadrant with prominent renal sinus fat, similar to prior.  Bilateral native kidneys with incompletely characterized cystic lesions of varying complexity.  Consider follow-up renal MRI.  Dilated main pancreatic duct up to 5 mm.  No obstructing lesion visualized by CT.  This can also be better characterized at the time of MR.  Original Report Authenticated By: Waneta Martins, M.D.    Scheduled Meds:    . amLODipine  10 mg Oral QHS  . diphenhydrAMINE  12.5 mg Intravenous Once  . heparin  5,000 Units Subcutaneous Q8H  . labetalol  5 mg Intravenous Once  . predniSONE  5 mg Oral Q breakfast  . DISCONTD: amLODipine  10 mg Oral Daily  . DISCONTD: calcium acetate  667 mg Oral TID WC   Continuous Infusions:     Lambert Keto, MD  [facility] Pager [phone]  If 7PM-7AM, please contact night-coverage [email or web address] Password [email or web address] 04/06/2012, 10:02 AM

## 2012-04-07 MED ORDER — CALCIUM ACETATE 667 MG PO CAPS
667.0000 mg | ORAL_CAPSULE | Freq: Three times a day (TID) | ORAL | Status: DC
  Administered 2012-04-09: 667 mg via ORAL
  Filled 2012-04-07 (×8): qty 1

## 2012-04-07 NOTE — Progress Notes (Signed)
  Subjective: Passing flatus this am, no n/v  Objective: Vital signs in last 24 hours: Temp:  [96.9 F (36.1 C)-98.8 F (37.1 C)] 98.8 F (37.1 C) (07/17 0515) Pulse Rate:  [88-114] 88  (07/17 0515) Resp:  [16-18] 18  (07/17 0515) BP: (94-190)/(54-95) 107/65 mmHg (07/17 0515) SpO2:  [93 %-96 %] 93 % (07/17 0515) Weight:  [107 lb 7.6 oz (48.75 kg)-124 lb 9 oz (56.5 kg)] 124 lb 9 oz (56.5 kg) (07/17 0515) Last BM Date: 04/02/12  Intake/Output from previous day: May 04, 2023 0701 - 07/17 0700 In: 0  Out: 2050 [Emesis/NG output:50] Intake/Output this shift:    General appearance: no distress GI: soft, nontender, some bs present  Lab Results:   [facility] 05-03-2012 0544  WBC 10.1  HGB 16.1  HCT 48.7  PLT 87*   BMET  Basename 05-03-2012 1330  NA 140  K 5.2*  CL 95*  CO2 21  GLUCOSE 86  BUN 68*  CREATININE 14.40*  CALCIUM 9.8   PT/INR No results found for this basename: LABPROT:2,INR:2 in the last 72 hours ABG No results found for this basename: PHART:2,PCO2:2,PO2:2,HCO3:2 in the last 72 hours  Studies/Results: Dg Abd 2 Views  05/03/2012  *RADIOLOGY REPORT*  Clinical Data: Small bowel obstruction.  Abdominal pain.  ABDOMEN - 2 VIEW  Comparison: Radiographs dated 04/05/2012 and CT scan dated 04/03/2012  Findings: There is persistent dilatation of multiple small bowel loops.  The contrast given for the prior CT scan remains in these dilated small bowel loops.  No contrast in the nondistended colon.  No free air in the upright radiograph.  IMPRESSION: Persistent small bowel obstruction, unchanged.  Original Report Authenticated By: Gwynn Burly, M.D.    Anti-infectives: Anti-infectives    None      Assessment/Plan: SBO likely adhesive  He is improved this am.  Will clamp ng today, leave npo, check films and examine in am.  Hopefully will continue to get better and avoid surgery.  LOS: 5 days    [facility] 04/07/2012

## 2012-04-07 NOTE — Care Management Note (Unsigned)
    Page 1 of 2   04/16/2012     3:30:20 PM   CARE MANAGEMENT NOTE 04/16/2012  Patient:  [name], [name]   Account Number:  [id number]  Date Initiated:  04/06/2012  Documentation initiated by:  Letha Cape  Subjective/Objective Assessment:   dx sbo  admit- lives alone. pta independent. patient is HD pt on Tues, Thur and Saturday.  He drives himself to his appts.     Action/Plan:   clamp ng tube, leave npo, check films and exam in am by surgery.   Anticipated DC Date:  04/16/2012   Anticipated DC Plan:  HOME/SELF CARE      DC Planning Services  CM consult      Choice offered to / List presented to:             Status of service:  In process, will continue to follow Medicare Important Message given?   (If response is "NO", the following Medicare IM given date fields will be blank) Date Medicare IM given:   Date Additional Medicare IM given:    Discharge Disposition:    Per UR Regulation:  Reviewed for med. necessity/level of care/duration of stay  If discussed at Long Length of Stay Meetings, dates discussed:   04/13/2012    Comments:  04/16/12 Lenord Fralix,RN,BSN 1110 PT S/P EXPLORATORY LAP WITH SBO X 2 ON 04/14/12.  MET WITH PT TO DISCUSS DC PLANS.  HE LIVES ALONE AND IS INDEPENDENT. HE HAS A SISTER WHO HELPS HIM OCCASIONALLY.  MD: CONSIDER PT/OT CONSULTS TO DETERMINE DISCHARGE NEEDS.  THANKS!   04/13/12 11:54 Letha Cape RN, BSN [phone] NG tube out, trying to advance diet but having issues with emesis, if unalble to advance diet patient will probably need surgery.  NCM will continue to follow.   04/07/12 10:25 Letha Cape RN, BSN [phone] patient's NG tube has been clamped, patient will continue to be npo per surgery and they will check films and exam patient again tomorrow, hopefully he will not need surgery, if not surgery possibly looking at 2-3 more days per surgery.  04/06/12 16:00 Letha Cape RN, BSN [phone] patient lives alone, pta independent.   Patient has HD on Tues, Thurs, and Sats, he drives himself to these appts. Patient has medication coverage and transportation.

## 2012-04-07 NOTE — Clinical Documentation Improvement (Signed)
BMI DOCUMENTATION CLARIFICATION QUERY  THIS DOCUMENT IS NOT A PERMANENT PART OF THE MEDICAL RECORD  TO RESPOND TO THE THIS QUERY, FOLLOW THE INSTRUCTIONS BELOW:  1. If needed, update documentation for the patient's encounter via the notes activity.  2. Access this query again and click edit on the In [employer].  3. After updating, or not, click F2 to complete all highlighted (required) fields concerning your review. Select "additional documentation in the medical record" OR "no additional documentation provided".  4. Click Sign note button.  5. The deficiency will fall out of your In Basket *Please let us know if you are not able to complete this workflow by phone or e-mail (listed below).         04/07/12  Dear Dr. [facility]  In an effort to better capture your patient's severity of illness, reflect appropriate length of stay and utilization of resources, a review of the patient medical record has revealed the following indicators.    Based on your clinical judgment, please clarify and document in a progress note and/or discharge summary the clinical condition associated with the following supporting information:  In responding to this query please exercise your independent judgment.  The fact that a query is asked, does not imply that any particular answer is desired or expected.  Possible Clinical conditions  Underweight w/BMI= 17.37 per [facility]  Other condition___________________  Cannot Clinically determine _____________  Risk Factors: Small bowel obstruction NPO x 5 days  Biometrics: Weight: 134.9 (adm); now 124.9lb (56.5kg) Height    5'11" BMI=     18.8(adm); now 17.37  Women Men  Underweight < 19    <20     Acceptable 19 - 25    20 - 25   Overweight 25 - 30    25 - 30  Obese 30 - 40    30 - 40   Morbidly Obese > 40    > 40    [employer]   Treatment Monitoring weight (daily  weights)   Reviewed:  no additional documentation provided  Thank You,  Rossie Muskrat RN, BSN Clinical Documentation Specialist Pager: [phone] [email or web address] Health Information Management [place]

## 2012-04-07 NOTE — Progress Notes (Signed)
PATIENT DETAILS Name: [name] Age: 51 y.o. Sex: male Date of Birth: 09-29-1960 Admit Date: 04/02/2012 ZOX:[email or web address] L, MD  Subjective: Passing Flatus this am  Objective: Vital signs in last 24 hours: Filed Vitals:   04/06/12 2030 04/06/12 2233 04/07/12 0515 04/07/12 0930  BP: 153/71 140/82 107/65 102/62  Pulse: 108 98 88   Temp: 98.6 F (37 C)  98.8 F (37.1 C)   TempSrc: Oral     Resp: 16  18   Height:      Weight:   56.5 kg (124 lb 9 oz)   SpO2: 96%  93%     Assessment/Plan: Active Problems:  SBO (small bowel obstruction) -passing flatus -seen by [facility] this am-no surgery -NG being clamped -continue with NPO status   ESRD (end stage renal disease) -per Renal -HD TTS  Erythrocytosis  HTN -BP controlled with Labetolol and Amlodipine  Chronic Gout -prednisone-chronic  Thrombocytopenia -Chronic -likely from Hep C -monitor platelet count while on prophylactic heparin  Disposition: -remain inpatient  DVT Prophylaxis: -prophylactic heparin-watch platelet-repeat CBC tomorrow  Code Status: -Full Code  Intake/Output from previous day:  Intake/Output Summary (Last 24 hours) at 04/07/12 1004 Last data filed at 04/07/12 0600  Gross per 24 hour  Intake      0 ml  Output   2050 ml  Net  -2050 ml    PHYSICAL EXAM: Weight change: -7.9 kg (-17 lb 6.7 oz) Body mass index is 17.37 kg/(m^2).  Gen Exam: Awake and alert with clear speech.  Neck: Supple, No JVD.   Chest: B/L Clear.   CVS: S1 S2 Regular, no murmurs.  Abdomen: soft, BS +, non tender, non distended.  Extremities: no edema, lower extremities warm to touch. Neurologic: Non Focal.   Skin: No Rash.   Wounds: N/A.    CONSULTS:  nephrology and general surgery  LAB RESULTS: CBC  Lab 04/06/12 0544 04/04/12 0505 04/03/12 1100 04/02/12 1918  WBC 10.1 13.6* 9.1 6.4  HGB 16.1 16.7 16.2 17.6*  HCT 48.7 50.4 48.7 52.5*  PLT 87* 118* 89* 91*  MCV 86.0 87.2 87.1 86.8  MCH 28.4 28.9 29.0  29.1  MCHC 33.1 33.1 33.3 33.5  RDW 15.1 15.1 14.8 14.7  LYMPHSABS -- -- -- --  MONOABS -- -- -- --  EOSABS -- -- -- --  BASOSABS -- -- -- --  BANDABS -- -- -- --    Chemistries   Lab 04/06/12 1330 04/03/12 1815 04/03/12 1100 04/02/12 1918  NA 140 140 -- 142  K 5.2* 4.7 -- 5.0  CL 95* 94* -- 95*  CO2 21 27 -- 28  GLUCOSE 86 94 -- 116*  BUN 68* 59* -- 45*  CREATININE 14.40* 12.77* 12.42* 11.16*  CALCIUM 9.8 10.0 -- 11.3*  MG -- -- -- --    CBG: No results found for this basename: GLUCAP:5 in the last 168 hours  GFR Estimated Creatinine Clearance: 4.8 ml/min (by C-G formula based on Cr of 14.4).  Coagulation profile No results found for this basename: INR:5,PROTIME:5 in the last 168 hours  Cardiac Enzymes No results found for this basename: CK:3,CKMB:3,TROPONINI:3,MYOGLOBIN:3 in the last 168 hours  No components found with this basename: POCBNP:3 No results found for this basename: DDIMER:2 in the last 72 hours No results found for this basename: HGBA1C:2 in the last 72 hours No results found for this basename: CHOL:2,HDL:2,LDLCALC:2,TRIG:2,CHOLHDL:2,LDLDIRECT:2 in the last 72 hours No results found for this basename: TSH,T4TOTAL,FREET3,T3FREE,THYROIDAB in the last 72 hours No results found  for this basename: VITAMINB12:2,FOLATE:2,FERRITIN:2,TIBC:2,IRON:2,RETICCTPCT:2 in the last 72 hours No results found for this basename: LIPASE:2,AMYLASE:2 in the last 72 hours  Urine Studies No results found for this basename: UACOL:2,UAPR:2,USPG:2,UPH:2,UTP:2,UGL:2,UKET:2,UBIL:2,UHGB:2,UNIT:2,UROB:2,ULEU:2,UEPI:2,UWBC:2,URBC:2,UBAC:2,CAST:2,CRYS:2,UCOM:2,BILUA:2 in the last 72 hours  MICROBIOLOGY: Recent Results (from the past 240 hour(s))  MRSA PCR SCREENING     Status: Normal   Collection Time   04/03/12 10:10 AM      Component Value Range Status Comment   MRSA by PCR NEGATIVE  NEGATIVE Final     RADIOLOGY STUDIES/RESULTS: Ct Abdomen Pelvis Wo Contrast  04/03/2012   *RADIOLOGY REPORT*  Clinical Data: Abdominal pain  CT ABDOMEN AND PELVIS WITHOUT CONTRAST  Technique:  Multidetector CT imaging of the abdomen and pelvis was performed following the standard protocol without intravenous contrast.  Comparison: 11/01/2004  Findings: Coronary artery calcification.  Linear opacity at the left lung base.  Organ abnormality/lesion detection is limited in the absence of intravenous contrast. Within this limitation, unremarkable liver, biliary system, spleen, adrenal glands. There is dilatation of the main pancreatic duct, measuring up to 5 mm.  Atrophic native kidneys with bilateral cystic lesions of varying complexity.  Transplant kidney left lower quadrant with sinus lipomatosis and cortical thinning.  There is a loop of small bowel distended up to 3.3 cm and demonstrating air fluid level.  Distal small bowel is decompressed.  Colonic diverticulosis.  No CT evidence for diverticulitis  No free intraperitoneal air or fluid.  No lymphadenopathy.  Advanced atherosclerotic disease of the aorta and branch vessels.  Decompressed bladder.  Surgical clips in the pelvis.  Partially imaged left femoral bypass.  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 Unchanged cystic and sclerotic changes of the right femoral neck.  No acute osseous finding.  IMPRESSION: Dilated small bowel loops up to 3.3 cm with air-fluid levels. Decompressed bowel loops distally.  This may reflect an early small bowel obstruction.  Recommend correlation with symptoms and if no intervention, follow-up KUB to document passage of contrast into the colon.  Advanced atherosclerotic disease.  Transplant kidney left lower quadrant with prominent renal sinus fat, similar to prior.  Bilateral native kidneys with incompletely characterized cystic lesions of varying complexity.  Consider follow-up renal MRI.  Dilated main pancreatic duct up to 5 mm.  No obstructing lesion visualized by CT.  This can also be better  characterized at the time of MR.  Original Report Authenticated By: Waneta Martins, M.D.   Abd 1 View (kub)  04/05/2012  *RADIOLOGY REPORT*  Clinical Data: Follow-up small bowel obstruction.  ABDOMEN - 1 VIEW  Comparison: 04/03/2012  Findings: Dilated lower abdominal small bowel loops are again noted compatible with small bowel obstruction.  Likely no significant change since prior CT.  No free air.  Surgical clips in the pelvis. No acute bony abnormality.  IMPRESSION: Stable small bowel dilatation in the lower abdomen, likely no significant change.  Original Report Authenticated By: Cyndie Chime, M.D.   Dg Abd 2 Views  04/06/2012  *RADIOLOGY REPORT*  Clinical Data: Small bowel obstruction.  Abdominal pain.  ABDOMEN - 2 VIEW  Comparison: Radiographs dated 04/05/2012 and CT scan dated 04/03/2012  Findings: There is persistent dilatation of multiple small bowel loops.  The contrast given for the prior CT scan remains in these dilated small bowel loops.  No contrast in the nondistended colon.  No free air in the upright radiograph.  IMPRESSION: Persistent small bowel obstruction, unchanged.  Original Report Authenticated By: Gwynn Burly, M.D.    MEDICATIONS: Scheduled  Meds:   . amLODipine  10 mg Oral QHS  . diphenhydrAMINE  12.5 mg Intravenous Once  . heparin  5,000 Units Subcutaneous Q8H  . labetalol  100 mg Oral BID  . pentafluoroprop-tetrafluoroeth      . predniSONE  5 mg Oral Q breakfast   Continuous Infusions:  PRN Meds:.acetaminophen, acetaminophen, hydrALAZINE, morphine injection, ondansetron (ZOFRAN) IV, ondansetron, phenol  Antibiotics: Anti-infectives    None     Jeoffrey Massed, MD  [facility] Pager [phone]  If 7PM-7AM, please contact night-coverage [email or web address] Password [email or web address] 04/07/2012, 10:04 AM   LOS: 5 days

## 2012-04-07 NOTE — Progress Notes (Signed)
Subjective:  Up in recliner; has been ambulatory, denies N/V; passing some gas  Vital signs in last 24 hours: Filed Vitals:   Apr 24, 2012 2030 04/24/2012 2233 04/07/12 0515 04/07/12 0930  BP: 153/71 140/82 107/65 102/62  Pulse: 108 98 88   Temp: 98.6 F (37 C)  98.8 F (37.1 C)   TempSrc: Oral     Resp: 16  18   Height:      Weight:   56.5 kg (124 lb 9 oz)   SpO2: 96%  93%    Weight change: -7.9 kg (-17 lb 6.7 oz)  Intake/Output Summary (Last 24 hours) at 04/07/12 1212 Last data filed at 04/07/12 0900  Gross per 24 hour  Intake      0 ml  Output   2050 ml  Net  -2050 ml   Labs: Basic Metabolic Panel:  Lab April 24, 2012 4540 04/03/12 1815 04/03/12 1100 04/02/12 1918  NA 140 140 -- 142  K 5.2* 4.7 -- 5.0  CL 95* 94* -- 95*  CO2 21 27 -- 28  GLUCOSE 86 94 -- 116*  BUN 68* 59* -- 45*  CREATININE 14.40* 12.77* 12.42* --  CALCIUM 9.8 10.0 -- 11.3*  ALB -- -- -- --  PHOS 7.2* 6.0* -- --   Liver Function Tests:  Lab 04-24-12 1330 04/03/12 1815 04/02/12 1918  AST -- -- 35  ALT -- -- 23  ALKPHOS -- -- 59  BILITOT -- -- 0.5  PROT -- -- 8.0  ALBUMIN 3.6 3.6 4.2   No results found for this basename: LIPASE:3,AMYLASE:3 in the last 168 hours No results found for this basename: AMMONIA:3 in the last 168 hours CBC:  Lab 04/24/2012 0544 04/04/12 0505 04/03/12 1100 04/02/12 1918  WBC 10.1 13.6* 9.1 --  NEUTROABS -- -- -- --  HGB 16.1 16.7 16.2 --  HCT 48.7 50.4 48.7 --  MCV 86.0 87.2 87.1 86.8  PLT 87* 118* 89* --   Cardiac Enzymes: No results found for this basename: CKTOTAL:5,CKMB:5,CKMBINDEX:5,TROPONINI:5 in the last 168 hours CBG: No results found for this basename: GLUCAP:5 in the last 168 hours  Iron Studies: No results found for this basename: IRON,TIBC,TRANSFERRIN,FERRITIN in the last 72 hours Studies/Results: Dg Abd 2 Views  24-Apr-2012  *RADIOLOGY REPORT*  Clinical Data: Small bowel obstruction.  Abdominal pain.  ABDOMEN - 2 VIEW  Comparison: Radiographs dated  04/05/2012 and CT scan dated 04/03/2012  Findings: There is persistent dilatation of multiple small bowel loops.  The contrast given for the prior CT scan remains in these dilated small bowel loops.  No contrast in the nondistended colon.  No free air in the upright radiograph.  IMPRESSION: Persistent small bowel obstruction, unchanged.  Original Report Authenticated By: Gwynn Burly, M.D.   Medications:      . amLODipine  10 mg Oral QHS  . diphenhydrAMINE  12.5 mg Intravenous Once  . heparin  5,000 Units Subcutaneous Q8H  . labetalol  100 mg Oral BID  . pentafluoroprop-tetrafluoroeth      . predniSONE  5 mg Oral Q breakfast    I  have reviewed scheduled and prn medications.  Physical Exam:  General: comfortable, NAD  Heart: RRR  Lungs: CTAB  Abdomen: NGT intact but clamped; abd NT/ND, hypoactive B.S.  Extremities: no ankle edema  Dialysis Access: left thigh AVG +T/B   Dialysis Orders: Center: [facility] on TTS.  EDW 52.5 kg HD Bath 2K/2Ca Time 3.5 hrs Heparin 5000 U. Access AVG @ left thigh BFR 400 DFR  800 Zemplar 0 mcg IV/HD Epogen 0 Units IV/HD Venofer 0.   Problem/Plan:  1. Abdominal pain/ nausea and vomiting - history multiple abdominal surgeries with SBO per CT/KUB; NGT now clamped; denies n/v and passing little gas; less pain not required any pain meds so far today; repeat films in am; surgery following  2. ESRD - HD TTS at [facility]; HD tomorrow; 2K bath with pre-HD K 5.2 on yesterday; cramping  After HD yesterday; UF goal 2L; suspect new eDW 48kg; needs f/u  3. Hypertension/volume - BP improved with current medication regimen and UF with HD; follow  4. Anemia - Hgb 16.1, no Epo or Fe. 5. Metabolic bone disease - Ca 9.8, Phos ^7.2 despite NPO status; binders held secondary to this; will resume; cont 2Ca bath, no vit D; follow ca/phos closely 6. Nutrition - Last Alb 3.6; NPO; follow. 7. S/p renal transplant - at [facility] in '87, started HD in 04/2003, remains on Prednisone.  8. Hepatitis  C/thrombocytopenia- plt 87; no heparin with HD and follow 9. Disposition- per primary  Samuel Germany, FNP-C [facility] Pager [phone]  04/07/2012,12:12 PM  LOS: 5 days   Patient seen and examined and agree with assessment and plan as above.  Vinson Moselle  MD [facility] [phone] pgr    [phone] cell 04/07/2012, 3:16 PM

## 2012-04-08 ENCOUNTER — Inpatient Hospital Stay (HOSPITAL_COMMUNITY): Payer: Medicare Other

## 2012-04-08 LAB — BASIC METABOLIC PANEL
BUN: 91 mg/dL — ABNORMAL HIGH (ref 6–23)
Chloride: 91 mEq/L — ABNORMAL LOW (ref 96–112)
Creatinine, Ser: 13.53 mg/dL — ABNORMAL HIGH (ref 0.50–1.35)
Glucose, Bld: 78 mg/dL (ref 70–99)
Potassium: 5.1 mEq/L (ref 3.5–5.1)

## 2012-04-08 LAB — CBC
HCT: 47.1 % (ref 39.0–52.0)
Hemoglobin: 16 g/dL (ref 13.0–17.0)
MCV: 85 fL (ref 78.0–100.0)
RDW: 14.6 % (ref 11.5–15.5)
WBC: 8.9 10*3/uL (ref 4.0–10.5)

## 2012-04-08 LAB — ALBUMIN: Albumin: 3.5 g/dL (ref 3.5–5.2)

## 2012-04-08 LAB — PHOSPHORUS: Phosphorus: 12.3 mg/dL — ABNORMAL HIGH (ref 2.3–4.6)

## 2012-04-08 MED ORDER — LABETALOL HCL 100 MG PO TABS
100.0000 mg | ORAL_TABLET | Freq: Two times a day (BID) | ORAL | Status: DC
  Filled 2012-04-08 (×3): qty 1

## 2012-04-08 NOTE — Progress Notes (Signed)
INITIAL ADULT NUTRITION ASSESSMENT Date: 04/08/2012   Time: 1:21 PM Reason for Assessment: NPO x 6 days   INTERVENTION: 1. Slow diet advance when medically able 2. Pt at risk for refeeding, Monitor magnesium, potassium, and phosphorus daily for at least 3 days once diet advanced, MD to replete as needed, as pt is at risk for refeeding syndrome given severe malnutrition and NPO x 6 days. 3. Add nutrition supplements once diet advances and pt not refeeding  4. RD will continue to follow    ASSESSMENT: Male 51 y.o.  Dx: SBO   Hx:  Past Medical History  Diagnosis Date  . Dialysis patient   . Prostate cancer     Related Meds:     . amLODipine  10 mg Oral QHS  . calcium acetate  667 mg Oral TID WC  . diphenhydrAMINE  12.5 mg Intravenous Once  . heparin  5,000 Units Subcutaneous Q8H  . labetalol  100 mg Oral BID  . predniSONE  5 mg Oral Q breakfast     Ht: 5\' 11"  (180.3 cm)  Wt: 104 lb 11.5 oz (47.5 kg)  Ideal Wt: 78.2 kg  % Ideal Wt: 61%  Usual Wt: 115 lbs, estimated dry weight as out patient  Wt Readings from Last 5 Encounters:  04/08/12 104 lb 11.5 oz (47.5 kg)    % Usual Wt: 90%  Body mass index is 14.61 kg/(m^2). Pt is underweight   Food/Nutrition Related Hx: Pt with normal appetite and stable weight PTA per his report.   Labs:  CMP     Component Value Date/Time   NA 141 04/08/2012 0639   K 5.1 04/08/2012 0639   CL 91* 04/08/2012 0639   CO2 23 04/08/2012 0639   GLUCOSE 78 04/08/2012 0639   BUN 91* 04/08/2012 0639   CREATININE 13.53* 04/08/2012 0639   CALCIUM 10.3 04/08/2012 0639   PROT 8.0 04/02/2012 1918   ALBUMIN 3.5 04/08/2012 0800   AST 35 04/02/2012 1918   ALT 23 04/02/2012 1918   ALKPHOS 59 04/02/2012 1918   BILITOT 0.5 04/02/2012 1918   GFRNONAA 4* 04/08/2012 0639   GFRAA 4* 04/08/2012 0639      Intake/Output Summary (Last 24 hours) at 04/08/12 1322 Last data filed at 04/08/12 1125  Gross per 24 hour  Intake      0 ml  Output    823 ml  Net    -823 ml      Diet Order: NPO  Supplements/Tube Feeding: none  IVF:   Adjust weight: 57.4 kg  Estimated Nutritional Needs:   Kcal: 2000-2200 Protein: 70-80 gm  Fluid:  urine output + 1000 ml   Pt admitted with SBO, has been NPO since 7/12, now day 6. NG tube has been clamped and sips and chips started. Pt also a dialysis pt, TTS, s/p renal transplant.  Pt with increased needs r/t dialysis. Estimated outpatient dry weight is 52.5 kg, now weighs 47.5 kg.  Pt with 5 kg weight loss, 9.5% body weight, in 6 days. This is severe weight loss. Pt with no intake for 6 days as well.  Pt meets criteria for severe malnutrition in the context of acute illness or injury 2/2 to 9.5% weight loss and 0% intake in 6 days, also visible muscle wasting evident in the calves/thighs.   Monitor magnesium, potassium, and phosphorus daily for at least 3 days, MD to replete as needed, as pt is at risk for refeeding syndrome given severe malnutrition and NPO x  6 days.   NUTRITION DIAGNOSIS: -Inadequate oral intake (NI-2.1).  Status: Ongoing  RELATED TO: inability to eat  AS EVIDENCE BY: NPO with bowel obstruction   MONITORING/EVALUATION(Goals): Goal: Diet advance as medically able   Monitor: PO intake, possible refeeding, weight, labs  EDUCATION NEEDS: -No education needs identified at this time -Pt denies needs for additional renal diet education at this time   DOCUMENTATION CODES Per approved criteria  -Severe malnutrition in the context of acute illness or injury -Underweight    Clarene Duke RD, LDN Pager [phone] After Hours pager [phone]

## 2012-04-08 NOTE — Progress Notes (Signed)
Patient ID: [name], male   DOB: 07/03/1961, 51 y.o.   MRN: [id number]    Subjective: Still passing flatus, denies n/v with NGT clamped, feels ok otherwise  Objective: Vital signs in last 24 hours: Temp:  [97.8 F (36.6 C)-98.4 F (36.9 C)] 97.8 F (36.6 C) (07/18 0531) Pulse Rate:  [77-87] 87  (07/18 0531) Resp:  [16-18] 16  (07/18 0531) BP: (94-135)/(58-80) 126/68 mmHg (07/18 0531) SpO2:  [92 %-97 %] 92 % (07/18 0531) Weight:  [105 lb 9.6 oz (47.9 kg)-106 lb 7.7 oz (48.3 kg)] 105 lb 9.6 oz (47.9 kg) (07/18 0531) Last BM Date: 04/02/12  Intake/Output from previous day:   Intake/Output this shift:    General appearance: alert and no distress GI: soft, nontender, more active bowel sounds  Lab Results:   [facility] Apr 17, 2012 0544  WBC 10.1  HGB 16.1  HCT 48.7  PLT 87*   BMET  Basename 04-17-2012 1330  NA 140  K 5.2*  CL 95*  CO2 21  GLUCOSE 86  BUN 68*  CREATININE 14.40*  CALCIUM 9.8   PT/INR No results found for this basename: LABPROT:2,INR:2 in the last 72 hours ABG No results found for this basename: PHART:2,PCO2:2,PO2:2,HCO3:2 in the last 72 hours  Studies/Results: Dg Abd 2 Views  April 17, 2012  *RADIOLOGY REPORT*  Clinical Data: Small bowel obstruction.  Abdominal pain.  ABDOMEN - 2 VIEW  Comparison: Radiographs dated 04/05/2012 and CT scan dated 04/03/2012  Findings: There is persistent dilatation of multiple small bowel loops.  The contrast given for the prior CT scan remains in these dilated small bowel loops.  No contrast in the nondistended colon.  No free air in the upright radiograph.  IMPRESSION: Persistent small bowel obstruction, unchanged.  Original Report Authenticated By: Gwynn Burly, M.D.    Anti-infectives: Anti-infectives    None      Assessment/Plan: SBO likely adhesive: seems to be resolving with conservative management, awaiting AM films, but clinically improving, will d/c NGT and start sips.  Recheck films in AM.  If doing well  can start advancing diet in AM.    LOS: 6 days    Analia Zuk 04/08/2012

## 2012-04-08 NOTE — Procedures (Signed)
I was present at this dialysis session. I have reviewed the session itself and made appropriate changes.   Vinson Moselle, MD [employer] 04/08/2012, 11:17 AM

## 2012-04-08 NOTE — Progress Notes (Signed)
Subjective:  Resting quietly; seen on HD; still has clamped NGT; no N/V/abd pain; passing gas  Vital signs in last 24 hours: Filed Vitals:   04/26/12 0700 2012/04/26 0728 04-26-2012 0800 04/26/12 0830  BP:  118/54 117/54 101/50  Pulse:  72 75 73  Temp:      TempSrc:      Resp:  16    Height:      Weight: 48.6 kg (107 lb 2.3 oz)     SpO2:       Weight change: -2.7 kg (-5 lb 15.2 oz)  Intake/Output Summary (Last 24 hours) at 04-26-2012 0905 Last data filed at 04/07/12 1844  Gross per 24 hour  Intake      0 ml  Output      0 ml  Net      0 ml   Labs: Basic Metabolic Panel:  Lab Apr 26, 2012 1610 2012/04/26 0639 04/06/12 1330 04/03/12 1815  NA -- 141 140 140  K -- 5.1 5.2* 4.7  CL -- 91* 95* 94*  CO2 -- 23 21 27   GLUCOSE -- 78 86 94  BUN -- 91* 68* 59*  CREATININE -- 13.53* 14.40* 12.77*  CALCIUM -- 10.3 9.8 10.0  ALB -- -- -- --  PHOS 12.3* -- 7.2* 6.0*   Liver Function Tests:  Lab 2012/04/26 0800 04/06/12 1330 04/03/12 1815 04/02/12 1918  AST -- -- -- 35  ALT -- -- -- 23  ALKPHOS -- -- -- 59  BILITOT -- -- -- 0.5  PROT -- -- -- 8.0  ALBUMIN 3.5 3.6 3.6 --   No results found for this basename: LIPASE:3,AMYLASE:3 in the last 168 hours No results found for this basename: AMMONIA:3 in the last 168 hours CBC:  Lab 26-Apr-2012 0639 04/06/12 0544 04/04/12 0505 04/03/12 1100 04/02/12 1918  WBC 8.9 10.1 13.6* -- --  NEUTROABS -- -- -- -- --  HGB 16.0 16.1 16.7 -- --  HCT 47.1 48.7 50.4 -- --  MCV 85.0 86.0 87.2 87.1 86.8  PLT 114* 87* 118* -- --   Cardiac Enzymes: No results found for this basename: CKTOTAL:5,CKMB:5,CKMBINDEX:5,TROPONINI:5 in the last 168 hours CBG: No results found for this basename: GLUCAP:5 in the last 168 hours  Iron Studies: No results found for this basename: IRON,TIBC,TRANSFERRIN,FERRITIN in the last 72 hours Studies/Results: Dg Abd 2 Views  April 26, 2012  *RADIOLOGY REPORT*  Clinical Data: Bowel obstruction, abdominal pain  ABDOMEN - 2 VIEW  Comparison:  04/06/2012  Findings: Tip of nasogastric tube in distal esophagus, recommend advancing tube 10 cm. Persistent dilatation of small bowel loops up to 4.2 cm diameter. No bowel wall thickening or free peritoneal air. Lung bases appear clear. Gas and stool are present within the colon. Surgical clips in pelvis. Scattered atherosclerotic calcifications. Bones appear demineralized with mild degenerative changes of the hip joints.  IMPRESSION: Persistent dilatation of small bowel loops though more gas and stool are present within the colon, consistent with partial small bowel obstruction. Recommend advancing nasogastric tube 10 cm.  Findings called to [facility] RN in Hemodialysis, where patient is currently located, on Apr 26, 2012 at 0805 hours.  Original Report Authenticated By: Lollie Marrow, M.D.   Dg Abd 2 Views  04/06/2012  *RADIOLOGY REPORT*  Clinical Data: Small bowel obstruction.  Abdominal pain.  ABDOMEN - 2 VIEW  Comparison: Radiographs dated 04/05/2012 and CT scan dated 04/03/2012  Findings: There is persistent dilatation of multiple small bowel loops.  The contrast given for the prior CT scan remains in  these dilated small bowel loops.  No contrast in the nondistended colon.  No free air in the upright radiograph.  IMPRESSION: Persistent small bowel obstruction, unchanged.  Original Report Authenticated By: Gwynn Burly, M.D.   Medications:      . amLODipine  10 mg Oral QHS  . calcium acetate  667 mg Oral TID WC  . diphenhydrAMINE  12.5 mg Intravenous Once  . heparin  5,000 Units Subcutaneous Q8H  . labetalol  100 mg Oral BID  . predniSONE  5 mg Oral Q breakfast    I  have reviewed scheduled and prn medications.  Physical Exam:  General: uncomfortable due to sore throat, NAD  Heart: RRR  Lungs: CTAB  Abdomen: NGT intact but clamped; abd NT/ND, hypoactive B.S.  Extremities: no ankle edema  Dialysis Access: left thigh AVG +T/B   Dialysis Orders: Center: [facility] on TTS.  EDW 52.5 kg HD Bath  2K/2Ca Time 3.5 hrs Heparin 5000 U. Access AVG @ left thigh BFR 400 DFR 800 Zemplar 0 mcg IV/HD Epogen 0 Units IV/HD Venofer 0.   Impression/Plan:  1. Abdominal pain/ nausea and vomiting- history of multiple abdominal surgeries with SBO per CT/KUB; NGT now clamped; denies n/v and passing little gas; less pain not required any pain meds ~ 24hrs; persistent SBO with films this am; surgery following  2. ESRD - HD TTS at [facility]; HD tomorrow; 2K bath with pre-HD K 5.2 on yesterday; cramping After HD yesterday; UF goal 1.5 today based on suspected eDW 48kg; needs f/u. Below dry weight and BP soft, keep even rest of HD today. 3. Hypertension/volume - not on BP meds at home, started on labetalol and norvasc here. Now BP low > d/c labetalol.   4. Anemia - Hgb 16.1, no Epo or Fe. 5. Metabolic bone disease - Ca 10.3, phos 12.3 despite NPO status; binders resumed, no vitamin D 6. Nutrition -Alb 3.5; NPO; follow. 7. S/p renal transplant - at [facility] in '87, started HD in 04/2003, remains on Prednisone.  8. Hepatitis C/thrombocytopenia- plt 114; no heparin with HD and follow 9. Disposition- per primary  Samuel Germany, FNP-C [facility] Pager [phone]  04/08/2012,9:05 AM  LOS: 6 days   Patient seen and examined and agree with assessment and plan as above with additions as indicated.  Vinson Moselle  MD [facility] [phone] pgr    [phone] cell 04/08/2012, 10:40 AM

## 2012-04-08 NOTE — Progress Notes (Signed)
Agree with above 

## 2012-04-08 NOTE — Progress Notes (Signed)
Notified [place],NP that patient's BP is 97/49 HR 90.  [place] gave order to hold tonight's dose of Norvasc and put parameters on labetalol to hold if SBP less than 100. Will continue to monitor patient. Jerrye Bushy

## 2012-04-08 NOTE — Progress Notes (Signed)
PATIENT DETAILS Name: [name] Age: 51 y.o. Sex: male Date of Birth: [birth year] Admit Date: 04/02/2012 WUJ:[id number],NFAOZ L, MD  Subjective: Passing [place]-NG discontinued-feels tired  Objective: Vital signs in last 24 hours: Filed Vitals:   04/08/12 1100 04/08/12 1103 04/08/12 1125 04/08/12 1431  BP: 117/45 120/58 111/56 98/61  Pulse: 93 95 84 82  Temp:   96.8 F (36 C) 97.8 F (36.6 C)  TempSrc:   Oral Oral  Resp:    20  Height:      Weight:   47.5 kg (104 lb 11.5 oz)   SpO2:   93% 94%    Assessment/Plan: Active Problems:  SBO (small bowel obstruction) -passing flatus -seen by [facility] this am-no surgery -NG being clamped on 7/17, removed 7/18 -starting ice and sips   ESRD (end stage renal disease) -per Renal -HD TTS  HTN -BP soft,continue with amlodipine - Labetolol discontinued on 7/18  Chronic Gout -prednisone-chronic  Thrombocytopenia -Chronic -likely from Hep C -monitor platelet count while on prophylactic heparin-decreasing-will stop heparin 7/18  Disposition: -remain inpatient  DVT Prophylaxis: -stop prophylactic heparin 7/18,platelet-continues to drop  Code Status: -Full Code  Intake/Output from previous day:  Intake/Output Summary (Last 24 hours) at 04/08/12 1538 Last data filed at 04/08/12 1125  Gross per 24 hour  Intake      0 ml  Output    823 ml  Net   -823 ml    PHYSICAL EXAM: Weight change: -2.7 kg (-5 lb 15.2 oz) Body mass index is 14.61 kg/(m^2).  Gen Exam: Awake and alert with clear speech.  Neck: Supple, No JVD.   Chest: B/L Clear.   CVS: S1 S2 Regular, no murmurs.  Abdomen: soft, BS +, non tender, non distended.  Extremities: no edema, lower extremities warm to touch. Neurologic: Non Focal.   Skin: No Rash.   Wounds: N/A.    CONSULTS:  nephrology and general surgery  LAB RESULTS: CBC  Lab 04/08/12 0639 04/06/12 0544 04/04/12 0505 04/03/12 1100 04/02/12 1918  WBC 8.9 10.1 13.6* 9.1 6.4  HGB 16.0 16.1 16.7  16.2 17.6*  HCT 47.1 48.7 50.4 48.7 52.5*  PLT 114* 87* 118* 89* 91*  MCV 85.0 86.0 87.2 87.1 86.8  MCH 28.9 28.4 28.9 29.0 29.1  MCHC 34.0 33.1 33.1 33.3 33.5  RDW 14.6 15.1 15.1 14.8 14.7  LYMPHSABS -- -- -- -- --  MONOABS -- -- -- -- --  EOSABS -- -- -- -- --  BASOSABS -- -- -- -- --  BANDABS -- -- -- -- --    Chemistries   Lab 04/08/12 0639 04/06/12 1330 04/03/12 1815 04/03/12 1100 04/02/12 1918  NA 141 140 140 -- 142  K 5.1 5.2* 4.7 -- 5.0  CL 91* 95* 94* -- 95*  CO2 23 21 27  -- 28  GLUCOSE 78 86 94 -- 116*  BUN 91* 68* 59* -- 45*  CREATININE 13.53* 14.40* 12.77* 12.42* 11.16*  CALCIUM 10.3 9.8 10.0 -- 11.3*  MG -- -- -- -- --    CBG: No results found for this basename: GLUCAP:5 in the last 168 hours  GFR Estimated Creatinine Clearance: 4.3 ml/min (by C-G formula based on Cr of 13.53).  Coagulation profile No results found for this basename: INR:5,PROTIME:5 in the last 168 hours  Cardiac Enzymes No results found for this basename: CK:3,CKMB:3,TROPONINI:3,MYOGLOBIN:3 in the last 168 hours  No components found with this basename: POCBNP:3 No results found for this basename: DDIMER:2 in the last 72 hours No results  found for this basename: HGBA1C:2 in the last 72 hours No results found for this basename: CHOL:2,HDL:2,LDLCALC:2,TRIG:2,CHOLHDL:2,LDLDIRECT:2 in the last 72 hours No results found for this basename: TSH,T4TOTAL,FREET3,T3FREE,THYROIDAB in the last 72 hours No results found for this basename: VITAMINB12:2,FOLATE:2,FERRITIN:2,TIBC:2,IRON:2,RETICCTPCT:2 in the last 72 hours No results found for this basename: LIPASE:2,AMYLASE:2 in the last 72 hours  Urine Studies No results found for this basename: UACOL:2,UAPR:2,USPG:2,UPH:2,UTP:2,UGL:2,UKET:2,UBIL:2,UHGB:2,UNIT:2,UROB:2,ULEU:2,UEPI:2,UWBC:2,URBC:2,UBAC:2,CAST:2,CRYS:2,UCOM:2,BILUA:2 in the last 72 hours  MICROBIOLOGY: Recent Results (from the past 240 hour(s))  MRSA PCR SCREENING     Status: Normal    Collection Time   04/03/12 10:10 AM      Component Value Range Status Comment   MRSA by PCR NEGATIVE  NEGATIVE Final     RADIOLOGY STUDIES/RESULTS: Ct Abdomen Pelvis Wo Contrast  04/03/2012  *RADIOLOGY REPORT*  Clinical Data: Abdominal pain  CT ABDOMEN AND PELVIS WITHOUT CONTRAST  Technique:  Multidetector CT imaging of the abdomen and pelvis was performed following the standard protocol without intravenous contrast.  Comparison: 11/01/2004  Findings: Coronary artery calcification.  Linear opacity at the left lung base.  Organ abnormality/lesion detection is limited in the absence of intravenous contrast. Within this limitation, unremarkable liver, biliary system, spleen, adrenal glands. There is dilatation of the main pancreatic duct, measuring up to 5 mm.  Atrophic native kidneys with bilateral cystic lesions of varying complexity.  Transplant kidney left lower quadrant with sinus lipomatosis and cortical thinning.  There is a loop of small bowel distended up to 3.3 cm and demonstrating air fluid level.  Distal small bowel is decompressed.  Colonic diverticulosis.  No CT evidence for diverticulitis  No free intraperitoneal air or fluid.  No lymphadenopathy.  Advanced atherosclerotic disease of the aorta and branch vessels.  Decompressed bladder.  Surgical clips in the pelvis.  Partially imaged left femoral bypass.  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 Unchanged cystic and sclerotic changes of the right femoral neck.  No acute osseous finding.  IMPRESSION: Dilated small bowel loops up to 3.3 cm with air-fluid levels. Decompressed bowel loops distally.  This may reflect an early small bowel obstruction.  Recommend correlation with symptoms and if no intervention, follow-up KUB to document passage of contrast into the colon.  Advanced atherosclerotic disease.  Transplant kidney left lower quadrant with prominent renal sinus fat, similar to prior.  Bilateral native kidneys with incompletely  characterized cystic lesions of varying complexity.  Consider follow-up renal MRI.  Dilated main pancreatic duct up to 5 mm.  No obstructing lesion visualized by CT.  This can also be better characterized at the time of MR.  Original Report Authenticated By: Waneta Martins, M.D.   Abd 1 View (kub)  04/05/2012  *RADIOLOGY REPORT*  Clinical Data: Follow-up small bowel obstruction.  ABDOMEN - 1 VIEW  Comparison: 04/03/2012  Findings: Dilated lower abdominal small bowel loops are again noted compatible with small bowel obstruction.  Likely no significant change since prior CT.  No free air.  Surgical clips in the pelvis. No acute bony abnormality.  IMPRESSION: Stable small bowel dilatation in the lower abdomen, likely no significant change.  Original Report Authenticated By: Cyndie Chime, M.D.   Dg Abd 2 Views  04/06/2012  *RADIOLOGY REPORT*  Clinical Data: Small bowel obstruction.  Abdominal pain.  ABDOMEN - 2 VIEW  Comparison: Radiographs dated 04/05/2012 and CT scan dated 04/03/2012  Findings: There is persistent dilatation of multiple small bowel loops.  The contrast given for the prior CT scan remains in these dilated small bowel  loops.  No contrast in the nondistended colon.  No free air in the upright radiograph.  IMPRESSION: Persistent small bowel obstruction, unchanged.  Original Report Authenticated By: Gwynn Burly, M.D.    MEDICATIONS: Scheduled Meds:    . amLODipine  10 mg Oral QHS  . calcium acetate  667 mg Oral TID WC  . diphenhydrAMINE  12.5 mg Intravenous Once  . labetalol  100 mg Oral BID  . predniSONE  5 mg Oral Q breakfast  . DISCONTD: heparin  5,000 Units Subcutaneous Q8H   Continuous Infusions:  PRN Meds:.acetaminophen, acetaminophen, hydrALAZINE, morphine injection, ondansetron (ZOFRAN) IV, ondansetron, phenol  Antibiotics: Anti-infectives    None     Jeoffrey Massed, MD  [facility] Pager [phone]  If 7PM-7AM, please contact  night-coverage [email or web address] Password [email or web address] 04/08/2012, 3:38 PM   LOS: 6 days

## 2012-04-09 MED ORDER — HEPARIN SODIUM (PORCINE) 1000 UNIT/ML DIALYSIS
5000.0000 [IU] | INTRAMUSCULAR | Status: DC | PRN
  Filled 2012-04-09: qty 5

## 2012-04-09 MED ORDER — LANTHANUM CARBONATE 500 MG PO CHEW
2000.0000 mg | CHEWABLE_TABLET | Freq: Three times a day (TID) | ORAL | Status: DC
  Administered 2012-04-09 – 2012-04-12 (×5): 2000 mg via ORAL
  Filled 2012-04-09 (×21): qty 4

## 2012-04-09 MED ORDER — SODIUM CHLORIDE 0.9 % IV BOLUS (SEPSIS)
1000.0000 mL | Freq: Once | INTRAVENOUS | Status: AC
  Administered 2012-04-09: 1000 mL via INTRAVENOUS

## 2012-04-09 NOTE — Progress Notes (Signed)
He is passing flatus, he still has some intermittent abdominal cramping, we will test him today with some clears and hopefully continues resolving.

## 2012-04-09 NOTE — Progress Notes (Signed)
Patient ID: [name], male   DOB: 15-Jun-1961, 51 y.o.   MRN: [id number] Patient ID: [name], male   DOB: April 17, 1961, 51 y.o.   MRN: [id number]    Subjective: Doing well with NGT out, denies n/v or abd pain.  Tolerating sips.  Wants more to eat.  Objective: Vital signs in last 24 hours: Temp:  [96.8 F (36 C)-98.3 F (36.8 C)] 97.8 F (36.6 C) (07/19 0600) Pulse Rate:  [73-98] 82  (07/19 0600) Resp:  [18-20] 18  (07/19 0600) BP: (91-120)/(45-84) 107/69 mmHg (07/19 0600) SpO2:  [92 %-94 %] 92 % 04/15/2023 2043) Weight:  [102 lb 15.3 oz (46.7 kg)-104 lb 11.5 oz (47.5 kg)] 102 lb 15.3 oz (46.7 kg) (07/19 0640) Last BM Date: 04/02/12  Intake/Output from previous day: 2023/04/15 0701 - 07/19 0700 In: -  Out: 823  Intake/Output this shift:    General appearance: alert and no distress GI: soft, nontender, more active bowel sounds  Lab Results:   [facility] 04-14-12 0639  WBC 8.9  HGB 16.0  HCT 47.1  PLT 114*   BMET  Basename 04/14/2012 0639 04/06/12 1330  NA 141 140  K 5.1 5.2*  CL 91* 95*  CO2 23 21  GLUCOSE 78 86  BUN 91* 68*  CREATININE 13.53* 14.40*  CALCIUM 10.3 9.8   PT/INR No results found for this basename: LABPROT:2,INR:2 in the last 72 hours ABG No results found for this basename: PHART:2,PCO2:2,PO2:2,HCO3:2 in the last 72 hours  Studies/Results: Dg Abd 2 Views  04/14/12  *RADIOLOGY REPORT*  Clinical Data: Bowel obstruction, abdominal pain  ABDOMEN - 2 VIEW  Comparison: 04/06/2012  Findings: Tip of nasogastric tube in distal esophagus, recommend advancing tube 10 cm. Persistent dilatation of small bowel loops up to 4.2 cm diameter. No bowel wall thickening or free peritoneal air. Lung bases appear clear. Gas and stool are present within the colon. Surgical clips in pelvis. Scattered atherosclerotic calcifications. Bones appear demineralized with mild degenerative changes of the hip joints.  IMPRESSION: Persistent dilatation of small bowel loops though more gas  and stool are present within the colon, consistent with partial small bowel obstruction. Recommend advancing nasogastric tube 10 cm.  Findings called to [facility] RN in Hemodialysis, where patient is currently located, on 04/14/2012 at 0805 hours.  Original Report Authenticated By: Lollie Marrow, M.D.    Anti-infectives: Anti-infectives    None      Assessment/Plan: SBO likely adhesive: continues to improve, will advance to clear liquids today, if does well with this can advance diet over the weekend and likely home on Sunday or Monday.  No other changes today.   LOS: 7 days    [name] 04/09/2012

## 2012-04-09 NOTE — Progress Notes (Addendum)
Subjective:  Lightheaded w standing, felt like he might pass out.  Taking liquid diet  Vital signs in last 24 hours: Filed Vitals:   April 09, 2012 2043 04/09/12 0600 04/09/12 0640 04/09/12 1050  BP: 97/49 107/69  91/59  Pulse: 90 82  82  Temp: 98.3 F (36.8 C) 97.8 F (36.6 C)    TempSrc: Oral Oral    Resp: 20 18    Height:      Weight:   46.7 kg (102 lb 15.3 oz)   SpO2: 92%      Weight change: -0.8 kg (-1 lb 12.2 oz)  Intake/Output Summary (Last 24 hours) at 04/09/12 1123 Last data filed at Apr 09, 2012 1125  Gross per 24 hour  Intake      0 ml  Output    823 ml  Net   -823 ml   Labs: Basic Metabolic Panel:  Lab 04-09-2012 4098 2012/04/09 0639 04/06/12 1330 04/03/12 1815  NA -- 141 140 140  K -- 5.1 5.2* 4.7  CL -- 91* 95* 94*  CO2 -- 23 21 27   GLUCOSE -- 78 86 94  BUN -- 91* 68* 59*  CREATININE -- 13.53* 14.40* 12.77*  CALCIUM -- 10.3 9.8 10.0  ALB -- -- -- --  PHOS 12.3* -- 7.2* 6.0*   Liver Function Tests:  Lab 09-Apr-2012 0800 04/06/12 1330 04/03/12 1815 04/02/12 1918  AST -- -- -- 35  ALT -- -- -- 23  ALKPHOS -- -- -- 59  BILITOT -- -- -- 0.5  PROT -- -- -- 8.0  ALBUMIN 3.5 3.6 3.6 --   No results found for this basename: LIPASE:3,AMYLASE:3 in the last 168 hours No results found for this basename: AMMONIA:3 in the last 168 hours CBC:  Lab 04-09-2012 0639 04/06/12 0544 04/04/12 0505 04/03/12 1100 04/02/12 1918  WBC 8.9 10.1 13.6* -- --  NEUTROABS -- -- -- -- --  HGB 16.0 16.1 16.7 -- --  HCT 47.1 48.7 50.4 -- --  MCV 85.0 86.0 87.2 87.1 86.8  PLT 114* 87* 118* -- --   Cardiac Enzymes: No results found for this basename: CKTOTAL:5,CKMB:5,CKMBINDEX:5,TROPONINI:5 in the last 168 hours CBG: No results found for this basename: GLUCAP:5 in the last 168 hours  Iron Studies: No results found for this basename: IRON,TIBC,TRANSFERRIN,FERRITIN in the last 72 hours Studies/Results: Dg Abd 2 Views  2012/04/09  *RADIOLOGY REPORT*  Clinical Data: Bowel obstruction, abdominal  pain  ABDOMEN - 2 VIEW  Comparison: 04/06/2012  Findings: Tip of nasogastric tube in distal esophagus, recommend advancing tube 10 cm. Persistent dilatation of small bowel loops up to 4.2 cm diameter. No bowel wall thickening or free peritoneal air. Lung bases appear clear. Gas and stool are present within the colon. Surgical clips in pelvis. Scattered atherosclerotic calcifications. Bones appear demineralized with mild degenerative changes of the hip joints.  IMPRESSION: Persistent dilatation of small bowel loops though more gas and stool are present within the colon, consistent with partial small bowel obstruction. Recommend advancing nasogastric tube 10 cm.  Findings called to [facility] RN in Hemodialysis, where patient is currently located, on 09-Apr-2012 at 0805 hours.  Original Report Authenticated By: Lollie Marrow, M.D.   Medications:      . amLODipine  10 mg Oral QHS  . calcium acetate  667 mg Oral TID WC  . diphenhydrAMINE  12.5 mg Intravenous Once  . labetalol  100 mg Oral BID  . predniSONE  5 mg Oral Q breakfast  . DISCONTD: heparin  5,000 Units  Subcutaneous Q8H  . DISCONTD: labetalol  100 mg Oral BID    I  have reviewed scheduled and prn medications.  Physical Exam:  General: looks washed out, poor skin turgor Heart: RRR  Lungs: CTAB  Abdomen: NGT intact but clamped; abd NT/ND, hypoactive B.S.  Extremities: no ankle edema  Dialysis Access: left thigh AVG +T/B   Dialysis Orders: Center: [facility] on TTS.  EDW 52.5 kg HD Bath 2K/2Ca Time 3.5 hrs Heparin 5000 U. Access AVG @ left thigh BFR 400 DFR 800 Zemplar 0 mcg IV/HD Epogen 0 Units IV/HD Venofer 0.   Impression/Plan 1. SBO- improving, conservative mgmnt per primary/surgery 2. Lightheadedness- 6 kg under dry weight and is vol depleted. Will give 1L NS and check orthostatics, no UF next HD.   3. ESRD - HD TTS at [facility]; HD tomorrow, no fluid off (vol depleted).  4. Hypertension/volume - was not on any BP medicine at home. Had labetalol  and norvasc started here for high BP. Will d/c both today due to BP in 90's.    5. Anemia - Hgb 16.1, no Epo or Fe. 6. Metabolic bone disease - Ca 10.3, phos 12.3 despite NPO status. Says he just recently changed from Phoslo to Fosrenol 2 gm ac tid due to new insurance and financial concerns. D/C phoslo and start Fosrenol. 7. Nutrition -Alb 3.5; NPO; follow. 8. S/p renal transplant - at [facility] in '87, started HD in 04/2003, remains on Prednisone.  9. Hepatitis C/thrombocytopenia- plt 114; no heparin with HD and follow 10. Disposition- per primary   Vinson Moselle  MD [facility] [phone] pgr    [phone] cell 04/09/2012, 11:23 AM

## 2012-04-09 NOTE — Progress Notes (Signed)
PATIENT DETAILS Name: [name] Age: 51 y.o. Sex: male Date of Birth: April 07, 1961 Admit Date: 04/02/2012 BJY:[id number] L, MD  Subjective: Passing Flatus  Objective: Vital signs in last 24 hours: Filed Vitals:   04/08/12 2043 04/09/12 0600 04/09/12 0640 04/09/12 1050  BP: 97/49 107/69  91/59  Pulse: 90 82  82  Temp: 98.3 F (36.8 C) 97.8 F (36.6 C)    TempSrc: Oral Oral    Resp: 20 18    Height:      Weight:   46.7 kg (102 lb 15.3 oz)   SpO2: 92%       Assessment/Plan: Active Problems:  SBO (small bowel obstruction) -passing flatus -seen by [facility] this am-no surgery -NG being clamped on 7/17, removed 7/18 -starting clears today   ESRD (end stage renal disease) -per Renal -HD TTS  HTN -BP soft-not on any any BP meds as outpatient - Labetolol discontinued on 7/18 -Amlodipine d/c's 7/19  Chronic Gout -prednisone-chronic  Thrombocytopenia -Chronic -likely from Hep C -monitor platelet count while on prophylactic heparin-decreasing-will stop heparin 7/18  Disposition: -remain inpatient  DVT Prophylaxis: -stop prophylactic heparin 7/18,platelet-continues to drop  Code Status: -Full Code  Intake/Output from previous day: No intake or output data in the 24 hours ending 04/09/12 1215  PHYSICAL EXAM: Weight change: -0.8 kg (-1 lb 12.2 oz) Body mass index is 14.36 kg/(m^2).  Gen Exam: Awake and alert with clear speech.  Neck: Supple, No JVD.   Chest: B/L Clear.   CVS: S1 S2 Regular, no murmurs.  Abdomen: soft, BS +, non tender, non distended.  Extremities: no edema, lower extremities warm to touch. Neurologic: Non Focal.   Skin: No Rash.   Wounds: N/A.    CONSULTS:  nephrology and general surgery  LAB RESULTS: CBC  Lab 04/08/12 0639 04/06/12 0544 04/04/12 0505 04/03/12 1100 04/02/12 1918  WBC 8.9 10.1 13.6* 9.1 6.4  HGB 16.0 16.1 16.7 16.2 17.6*  HCT 47.1 48.7 50.4 48.7 52.5*  PLT 114* 87* 118* 89* 91*  MCV 85.0 86.0 87.2 87.1 86.8    MCH 28.9 28.4 28.9 29.0 29.1  MCHC 34.0 33.1 33.1 33.3 33.5  RDW 14.6 15.1 15.1 14.8 14.7  LYMPHSABS -- -- -- -- --  MONOABS -- -- -- -- --  EOSABS -- -- -- -- --  BASOSABS -- -- -- -- --  BANDABS -- -- -- -- --    Chemistries   Lab 04/08/12 0639 04/06/12 1330 04/03/12 1815 04/03/12 1100 04/02/12 1918  NA 141 140 140 -- 142  K 5.1 5.2* 4.7 -- 5.0  CL 91* 95* 94* -- 95*  CO2 23 21 27  -- 28  GLUCOSE 78 86 94 -- 116*  BUN 91* 68* 59* -- 45*  CREATININE 13.53* 14.40* 12.77* 12.42* 11.16*  CALCIUM 10.3 9.8 10.0 -- 11.3*  MG -- -- -- -- --    CBG: No results found for this basename: GLUCAP:5 in the last 168 hours  GFR Estimated Creatinine Clearance: 4.2 ml/min (by C-G formula based on Cr of 13.53).  Coagulation profile No results found for this basename: INR:5,PROTIME:5 in the last 168 hours  Cardiac Enzymes No results found for this basename: CK:3,CKMB:3,TROPONINI:3,MYOGLOBIN:3 in the last 168 hours  No components found with this basename: POCBNP:3 No results found for this basename: DDIMER:2 in the last 72 hours No results found for this basename: HGBA1C:2 in the last 72 hours No results found for this basename: CHOL:2,HDL:2,LDLCALC:2,TRIG:2,CHOLHDL:2,LDLDIRECT:2 in the last 72 hours No results found for  this basename: TSH,T4TOTAL,FREET3,T3FREE,THYROIDAB in the last 72 hours No results found for this basename: VITAMINB12:2,FOLATE:2,FERRITIN:2,TIBC:2,IRON:2,RETICCTPCT:2 in the last 72 hours No results found for this basename: LIPASE:2,AMYLASE:2 in the last 72 hours  Urine Studies No results found for this basename: UACOL:2,UAPR:2,USPG:2,UPH:2,UTP:2,UGL:2,UKET:2,UBIL:2,UHGB:2,UNIT:2,UROB:2,ULEU:2,UEPI:2,UWBC:2,URBC:2,UBAC:2,CAST:2,CRYS:2,UCOM:2,BILUA:2 in the last 72 hours  MICROBIOLOGY: Recent Results (from the past 240 hour(s))  MRSA PCR SCREENING     Status: Normal   Collection Time   04/03/12 10:10 AM      Component Value Range Status Comment   MRSA by PCR NEGATIVE   NEGATIVE Final     RADIOLOGY STUDIES/RESULTS: Ct Abdomen Pelvis Wo Contrast  04/03/2012  *RADIOLOGY REPORT*  Clinical Data: Abdominal pain  CT ABDOMEN AND PELVIS WITHOUT CONTRAST  Technique:  Multidetector CT imaging of the abdomen and pelvis was performed following the standard protocol without intravenous contrast.  Comparison: 11/01/2004  Findings: Coronary artery calcification.  Linear opacity at the left lung base.  Organ abnormality/lesion detection is limited in the absence of intravenous contrast. Within this limitation, unremarkable liver, biliary system, spleen, adrenal glands. There is dilatation of the main pancreatic duct, measuring up to 5 mm.  Atrophic native kidneys with bilateral cystic lesions of varying complexity.  Transplant kidney left lower quadrant with sinus lipomatosis and cortical thinning.  There is a loop of small bowel distended up to 3.3 cm and demonstrating air fluid level.  Distal small bowel is decompressed.  Colonic diverticulosis.  No CT evidence for diverticulitis  No free intraperitoneal air or fluid.  No lymphadenopathy.  Advanced atherosclerotic disease of the aorta and branch vessels.  Decompressed bladder.  Surgical clips in the pelvis.  Partially imaged left femoral bypass.  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 Unchanged cystic and sclerotic changes of the right femoral neck.  No acute osseous finding.  IMPRESSION: Dilated small bowel loops up to 3.3 cm with air-fluid levels. Decompressed bowel loops distally.  This may reflect an early small bowel obstruction.  Recommend correlation with symptoms and if no intervention, follow-up KUB to document passage of contrast into the colon.  Advanced atherosclerotic disease.  Transplant kidney left lower quadrant with prominent renal sinus fat, similar to prior.  Bilateral native kidneys with incompletely characterized cystic lesions of varying complexity.  Consider follow-up renal MRI.  Dilated main  pancreatic duct up to 5 mm.  No obstructing lesion visualized by CT.  This can also be better characterized at the time of MR.  Original Report Authenticated By: Waneta Martins, M.D.   Abd 1 View (kub)  04/05/2012  *RADIOLOGY REPORT*  Clinical Data: Follow-up small bowel obstruction.  ABDOMEN - 1 VIEW  Comparison: 04/03/2012  Findings: Dilated lower abdominal small bowel loops are again noted compatible with small bowel obstruction.  Likely no significant change since prior CT.  No free air.  Surgical clips in the pelvis. No acute bony abnormality.  IMPRESSION: Stable small bowel dilatation in the lower abdomen, likely no significant change.  Original Report Authenticated By: Cyndie Chime, M.D.   Dg Abd 2 Views  04/06/2012  *RADIOLOGY REPORT*  Clinical Data: Small bowel obstruction.  Abdominal pain.  ABDOMEN - 2 VIEW  Comparison: Radiographs dated 04/05/2012 and CT scan dated 04/03/2012  Findings: There is persistent dilatation of multiple small bowel loops.  The contrast given for the prior CT scan remains in these dilated small bowel loops.  No contrast in the nondistended colon.  No free air in the upright radiograph.  IMPRESSION: Persistent small bowel obstruction, unchanged.  Original Report  Authenticated By: Gwynn Burly, M.D.    MEDICATIONS: Scheduled Meds:    . diphenhydrAMINE  12.5 mg Intravenous Once  . lanthanum  2,000 mg Oral TID WC  . predniSONE  5 mg Oral Q breakfast  . sodium chloride  1,000 mL Intravenous Once  . DISCONTD: amLODipine  10 mg Oral QHS  . DISCONTD: calcium acetate  667 mg Oral TID WC  . DISCONTD: heparin  5,000 Units Subcutaneous Q8H  . DISCONTD: labetalol  100 mg Oral BID  . DISCONTD: labetalol  100 mg Oral BID   Continuous Infusions:  PRN Meds:.acetaminophen, acetaminophen, heparin, hydrALAZINE, morphine injection, ondansetron (ZOFRAN) IV, ondansetron, phenol  Antibiotics: Anti-infectives    None     Jeoffrey Massed, MD  [facility] Pager [phone]  If 7PM-7AM, please contact night-coverage [email or web address] Password [email or web address] 04/09/2012, 12:15 PM   LOS: 7 days

## 2012-04-10 ENCOUNTER — Inpatient Hospital Stay (HOSPITAL_COMMUNITY): Payer: Medicare Other

## 2012-04-10 LAB — RENAL FUNCTION PANEL
CO2: 20 mEq/L (ref 19–32)
Calcium: 9.8 mg/dL (ref 8.4–10.5)
Chloride: 94 mEq/L — ABNORMAL LOW (ref 96–112)
GFR calc Af Amer: 5 mL/min — ABNORMAL LOW (ref >90)
GFR calc non Af Amer: 4 mL/min — ABNORMAL LOW (ref >90)
Potassium: 4.9 mEq/L (ref 3.5–5.1)
Sodium: 140 mEq/L (ref 135–145)

## 2012-04-10 LAB — CBC
Hemoglobin: 15.1 g/dL (ref 13.0–17.0)
MCH: 28.7 pg (ref 26.0–34.0)
Platelets: 139 10*3/uL — ABNORMAL LOW (ref 150–400)
RBC: 5.27 MIL/uL (ref 4.22–5.81)
WBC: 7.2 10*3/uL (ref 4.0–10.5)

## 2012-04-10 MED ORDER — AMLODIPINE BESYLATE 5 MG PO TABS
5.0000 mg | ORAL_TABLET | Freq: Every day | ORAL | Status: DC
  Administered 2012-04-10 – 2012-04-12 (×3): 5 mg via ORAL
  Filled 2012-04-10 (×5): qty 1

## 2012-04-10 MED ORDER — POLYETHYLENE GLYCOL 3350 17 G PO PACK
17.0000 g | PACK | Freq: Once | ORAL | Status: DC
  Filled 2012-04-10: qty 1

## 2012-04-10 NOTE — Progress Notes (Signed)
PATIENT DETAILS Name: [name] Age: 51 y.o. Sex: male Date of Birth: Mar 16, 1961 Admit Date: 04/02/2012 ZOX:[id number] L, MD  Subjective: Passing Flatus-no BM yet-tolerating clears  Objective: Vital signs in last 24 hours: Filed Vitals:   04/10/12 0930 04/10/12 1000 04/10/12 1022 04/10/12 1024  BP: 171/83 167/91 166/87 167/88  Pulse: 76 77 81 70  Temp:   96.4 F (35.8 C) 96.4 F (35.8 C)  TempSrc:    Oral  Resp: 16 15 20 18   Height:      Weight:    48.7 kg (107 lb 5.8 oz)  SpO2:   98% 98%    Assessment/Plan: Active Problems:  SBO (small bowel obstruction) -passing flatus -seen by [facility] this am-diet being advanced -NG being clamped on 7/17, removed 7/18 -  ESRD (end stage renal disease) -per Renal -HD TTS  HTN -BP uncontrolled this am-amlodipine restarted by renal  Chronic Gout -prednisone-chronic  Thrombocytopenia -Chronic-but almost back to normal -likely from Hep C -monitor platelet count while on prophylactic heparin-decreasing- stopped heparin 7/18  Disposition: -remain inpatient  DVT Prophylaxis: -stop prophylactic heparin 7/18,platelet-now stable  Code Status: -Full Code  Intake/Output from previous day:  Intake/Output Summary (Last 24 hours) at 04/10/12 1404 Last data filed at 04/10/12 1024  Gross per 24 hour  Intake      0 ml  Output      0 ml  Net      0 ml    PHYSICAL EXAM: Weight change: 1.2 kg (2 lb 10.3 oz) Body mass index is 14.97 kg/(m^2).  Gen Exam: Awake and alert with clear speech.  Neck: Supple, No JVD.   Chest: B/L Clear.   CVS: S1 S2 Regular, no murmurs.  Abdomen: soft, BS +, non tender, non distended.  Extremities: no edema, lower extremities warm to touch. Neurologic: Non Focal.   Skin: No Rash.   Wounds: N/A.    CONSULTS:  nephrology and general surgery  LAB RESULTS: CBC  Lab 04/10/12 0659 04/08/12 0639 04/06/12 0544 04/04/12 0505  WBC 7.2 8.9 10.1 13.6*  HGB 15.1 16.0 16.1 16.7  HCT 43.9 47.1  48.7 50.4  PLT 139* 114* 87* 118*  MCV 83.3 85.0 86.0 87.2  MCH 28.7 28.9 28.4 28.9  MCHC 34.4 34.0 33.1 33.1  RDW 14.1 14.6 15.1 15.1  LYMPHSABS -- -- -- --  MONOABS -- -- -- --  EOSABS -- -- -- --  BASOSABS -- -- -- --  BANDABS -- -- -- --    Chemistries   Lab 04/10/12 0659 04/08/12 0639 04/06/12 1330 04/03/12 1815  NA 140 141 140 140  K 4.9 5.1 5.2* 4.7  CL 94* 91* 95* 94*  CO2 20 23 21 27   GLUCOSE 76 78 86 94  BUN 84* 91* 68* 59*  CREATININE 11.92* 13.53* 14.40* 12.77*  CALCIUM 9.8 10.3 9.8 10.0  MG -- -- -- --    CBG: No results found for this basename: GLUCAP:5 in the last 168 hours  GFR Estimated Creatinine Clearance: 5 ml/min (by C-G formula based on Cr of 11.92).  Coagulation profile No results found for this basename: INR:5,PROTIME:5 in the last 168 hours  Cardiac Enzymes No results found for this basename: CK:3,CKMB:3,TROPONINI:3,MYOGLOBIN:3 in the last 168 hours  No components found with this basename: POCBNP:3 No results found for this basename: DDIMER:2 in the last 72 hours No results found for this basename: HGBA1C:2 in the last 72 hours No results found for this basename: CHOL:2,HDL:2,LDLCALC:2,TRIG:2,CHOLHDL:2,LDLDIRECT:2 in the last 72 hours  No results found for this basename: TSH,T4TOTAL,FREET3,T3FREE,THYROIDAB in the last 72 hours No results found for this basename: VITAMINB12:2,FOLATE:2,FERRITIN:2,TIBC:2,IRON:2,RETICCTPCT:2 in the last 72 hours No results found for this basename: LIPASE:2,AMYLASE:2 in the last 72 hours  Urine Studies No results found for this basename: UACOL:2,UAPR:2,USPG:2,UPH:2,UTP:2,UGL:2,UKET:2,UBIL:2,UHGB:2,UNIT:2,UROB:2,ULEU:2,UEPI:2,UWBC:2,URBC:2,UBAC:2,CAST:2,CRYS:2,UCOM:2,BILUA:2 in the last 72 hours  MICROBIOLOGY: Recent Results (from the past 240 hour(s))  MRSA PCR SCREENING     Status: Normal   Collection Time   04/03/12 10:10 AM      Component Value Range Status Comment   MRSA by PCR NEGATIVE  NEGATIVE Final      RADIOLOGY STUDIES/RESULTS: Ct Abdomen Pelvis Wo Contrast  04/03/2012  *RADIOLOGY REPORT*  Clinical Data: Abdominal pain  CT ABDOMEN AND PELVIS WITHOUT CONTRAST  Technique:  Multidetector CT imaging of the abdomen and pelvis was performed following the standard protocol without intravenous contrast.  Comparison: 11/01/2004  Findings: Coronary artery calcification.  Linear opacity at the left lung base.  Organ abnormality/lesion detection is limited in the absence of intravenous contrast. Within this limitation, unremarkable liver, biliary system, spleen, adrenal glands. There is dilatation of the main pancreatic duct, measuring up to 5 mm.  Atrophic native kidneys with bilateral cystic lesions of varying complexity.  Transplant kidney left lower quadrant with sinus lipomatosis and cortical thinning.  There is a loop of small bowel distended up to 3.3 cm and demonstrating air fluid level.  Distal small bowel is decompressed.  Colonic diverticulosis.  No CT evidence for diverticulitis  No free intraperitoneal air or fluid.  No lymphadenopathy.  Advanced atherosclerotic disease of the aorta and branch vessels.  Decompressed bladder.  Surgical clips in the pelvis.  Partially imaged left femoral bypass.  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 Unchanged cystic and sclerotic changes of the right femoral neck.  No acute osseous finding.  IMPRESSION: Dilated small bowel loops up to 3.3 cm with air-fluid levels. Decompressed bowel loops distally.  This may reflect an early small bowel obstruction.  Recommend correlation with symptoms and if no intervention, follow-up KUB to document passage of contrast into the colon.  Advanced atherosclerotic disease.  Transplant kidney left lower quadrant with prominent renal sinus fat, similar to prior.  Bilateral native kidneys with incompletely characterized cystic lesions of varying complexity.  Consider follow-up renal MRI.  Dilated main pancreatic duct up to 5  mm.  No obstructing lesion visualized by CT.  This can also be better characterized at the time of MR.  Original Report Authenticated By: Waneta Martins, M.D.   Abd 1 View (kub)  04/05/2012  *RADIOLOGY REPORT*  Clinical Data: Follow-up small bowel obstruction.  ABDOMEN - 1 VIEW  Comparison: 04/03/2012  Findings: Dilated lower abdominal small bowel loops are again noted compatible with small bowel obstruction.  Likely no significant change since prior CT.  No free air.  Surgical clips in the pelvis. No acute bony abnormality.  IMPRESSION: Stable small bowel dilatation in the lower abdomen, likely no significant change.  Original Report Authenticated By: Cyndie Chime, M.D.   Dg Abd 2 Views  04/06/2012  *RADIOLOGY REPORT*  Clinical Data: Small bowel obstruction.  Abdominal pain.  ABDOMEN - 2 VIEW  Comparison: Radiographs dated 04/05/2012 and CT scan dated 04/03/2012  Findings: There is persistent dilatation of multiple small bowel loops.  The contrast given for the prior CT scan remains in these dilated small bowel loops.  No contrast in the nondistended colon.  No free air in the upright radiograph.  IMPRESSION: Persistent small bowel obstruction,  unchanged.  Original Report Authenticated By: Gwynn Burly, M.D.    MEDICATIONS: Scheduled Meds:    . amLODipine  5 mg Oral Daily  . diphenhydrAMINE  12.5 mg Intravenous Once  . lanthanum  2,000 mg Oral TID WC  . polyethylene glycol  17 g Oral Once  . predniSONE  5 mg Oral Q breakfast   Continuous Infusions:  PRN Meds:.acetaminophen, acetaminophen, heparin, hydrALAZINE, morphine injection, ondansetron (ZOFRAN) IV, ondansetron, phenol  Antibiotics: Anti-infectives    None     Jeoffrey Massed, MD  [facility] Pager [phone]  If 7PM-7AM, please contact night-coverage [email or web address] Password [email or web address] 04/10/2012, 2:04 PM   LOS: 8 days

## 2012-04-10 NOTE — Progress Notes (Signed)
  Subjective: Passing flatus, no n/v, tol clears well, tired as just returned from hd  Objective: Vital signs in last 24 hours: Temp:  [96.4 F (35.8 C)-98.2 F (36.8 C)] 96.4 F (35.8 C) (07/20 1024) Pulse Rate:  [66-81] 70  (07/20 1024) Resp:  [15-20] 18  (07/20 1024) BP: (102-172)/(68-91) 167/88 mmHg (07/20 1024) SpO2:  [94 %-98 %] 98 % (07/20 1024) Weight:  [107 lb 5.8 oz (48.7 kg)] 107 lb 5.8 oz (48.7 kg) (07/20 1024) Last BM Date:  (pt admits last bm was last week)  Intake/Output from previous day:   Intake/Output this shift:    General appearance: no distress GI: soft, nontender, bs present  Lab Results:   [facility] 04/10/12 0659 04/08/12 0639  WBC 7.2 8.9  HGB 15.1 16.0  HCT 43.9 47.1  PLT 139* 114*   BMET  Basename 04/10/12 0659 04/08/12 0639  NA 140 141  K 4.9 5.1  CL 94* 91*  CO2 20 23  GLUCOSE 76 78  BUN 84* 91*  CREATININE 11.92* 13.53*  CALCIUM 9.8 10.3   PT/INR No results found for this basename: LABPROT:2,INR:2 in the last 72 hours ABG No results found for this basename: PHART:2,PCO2:2,PO2:2,HCO3:2 in the last 72 hours  Studies/Results: No results found.  Anti-infectives: Anti-infectives    None      Assessment/Plan: sbo  He appears to continue to resolve, will advance to fulls today    LOS: 8 days    [facility] 04/10/2012

## 2012-04-10 NOTE — Progress Notes (Signed)
Emesis x1 greenish color moderate amount after attempting to eat full liquid tray. Pt feeling weak and stomach still remains a little nauseated. Zofran 4mg  IV given now will reevaluate in 30 minutes. Pt in bed and comfortable will continue to monitor and assist as needed. Julien Nordmann [facility]

## 2012-04-10 NOTE — Plan of Care (Signed)
Problem: Discharge Progression Outcomes Goal: Tolerating diet Outcome: Progressing Pt tolerating CLD -diet advanced to full

## 2012-04-10 NOTE — Progress Notes (Signed)
Subjective:  Lightdeadness w standing resolved after NS bolus. Walked in halls first time yesterday without difficulty.  +flatus, no BM, taking liquids w/o vomiting.   Vital signs in last 24 hours: Filed Vitals:   04/10/12 0800 04/10/12 0831 04/10/12 0900 04/10/12 0930  BP: 167/84 157/85 172/84 171/83  Pulse: 67 68 75 76  Temp:      TempSrc:      Resp: 18 20 18 16   Height:      Weight:      SpO2:       Weight change: 1.2 kg (2 lb 10.3 oz) No intake or output data in the 24 hours ending 04/10/12 0958 Labs: Basic Metabolic Panel:  Lab 04/10/12 1610 04/08/12 0800 04/08/12 0639 04/06/12 1330  NA 140 -- 141 140  K 4.9 -- 5.1 5.2*  CL 94* -- 91* 95*  CO2 20 -- 23 21  GLUCOSE 76 -- 78 86  BUN 84* -- 91* 68*  CREATININE 11.92* -- 13.53* 14.40*  CALCIUM 9.8 -- 10.3 9.8  ALB -- -- -- --  PHOS 12.0* 12.3* -- 7.2*   Liver Function Tests:  Lab 04/10/12 0659 04/08/12 0800 04/06/12 1330  AST -- -- --  ALT -- -- --  ALKPHOS -- -- --  BILITOT -- -- --  PROT -- -- --  ALBUMIN 3.2* 3.5 3.6   No results found for this basename: LIPASE:3,AMYLASE:3 in the last 168 hours No results found for this basename: AMMONIA:3 in the last 168 hours CBC:  Lab 04/10/12 0659 04/08/12 0639 04/06/12 0544 04/04/12 0505 04/03/12 1100  WBC 7.2 8.9 10.1 -- --  NEUTROABS -- -- -- -- --  HGB 15.1 16.0 16.1 -- --  HCT 43.9 47.1 48.7 -- --  MCV 83.3 85.0 86.0 87.2 87.1  PLT 139* 114* 87* -- --   Cardiac Enzymes: No results found for this basename: CKTOTAL:5,CKMB:5,CKMBINDEX:5,TROPONINI:5 in the last 168 hours CBG: No results found for this basename: GLUCAP:5 in the last 168 hours  Iron Studies: No results found for this basename: IRON,TIBC,TRANSFERRIN,FERRITIN in the last 72 hours Studies/Results: No results found. Medications:      . diphenhydrAMINE  12.5 mg Intravenous Once  . lanthanum  2,000 mg Oral TID WC  . predniSONE  5 mg Oral Q breakfast  . sodium chloride  1,000 mL Intravenous Once  .  DISCONTD: amLODipine  10 mg Oral QHS  . DISCONTD: calcium acetate  667 mg Oral TID WC  . DISCONTD: labetalol  100 mg Oral BID    I  have reviewed scheduled and prn medications.  Physical Exam:  General: looks tired Heart: RRR  Lungs: CTAB  Abdomen: NGT intact but clamped; abd NT/ND, hypoactive B.S.  Extremities: no ankle edema  Dialysis Access: left thigh AVG +T/B   Dialysis Orders: Center: [facility] on TTS.  EDW 52.5 kg HD Bath 2K/2Ca Time 3.5 hrs Heparin 5000 U. Access AVG @ left thigh BFR 400 DFR 800 Zemplar 0 mcg IV/HD Epogen 0 Units IV/HD Venofer 0.   Impression/Plan 1. SBO- improving, conservative mgmnt per primary/surgery, ? Improving 2. Volume depletion- symptomatic, s/p saline bolus 7/19. Better. No fluid off w HD today. 3. ESRD - HD TTS at [facility]; HD today.  4. Hypertension/volume - was not on any BP medicine at home. Had labetalol and norvasc started here for high BP. Resume low dose Norvasc with BP 's elevated today. 5. Anemia - Hgb 16.1, no Epo or Fe, same as outpt. 6. Metabolic bone disease - Ca  10.3, phos 12.3 despite NPO status. Says he just recently changed from Phoslo to Fosrenol 2 gm ac tid due to new insurance and financial concerns. D/C phoslo and start Fosrenol. 7. Nutrition -Alb 3.5; NPO; follow. 8. S/p renal transplant - at [facility] in '87, started HD in 04/2003, remains on Prednisone.  9. Hepatitis C/thrombocytopenia- plt 114; no heparin with HD and follow 10. Disposition- per primary   Vinson Moselle  MD [facility] [phone] pgr    [phone] cell 04/10/2012, 9:58 AM

## 2012-04-10 NOTE — Procedures (Signed)
I was present at this dialysis session. I have reviewed the session itself and made appropriate changes.   Vinson Moselle, MD [employer] 04/10/2012, 10:05 AM

## 2012-04-11 ENCOUNTER — Inpatient Hospital Stay (HOSPITAL_COMMUNITY): Payer: Medicare Other

## 2012-04-11 MED ORDER — ONDANSETRON HCL 4 MG/2ML IJ SOLN
4.0000 mg | Freq: Once | INTRAMUSCULAR | Status: AC
  Administered 2012-04-11: 4 mg via INTRAVENOUS

## 2012-04-11 NOTE — Progress Notes (Signed)
Subjective:  2 episodes emesis yesterday, no BM or flatus.   Vital signs in last 24 hours: Filed Vitals:   04/10/12 2115 04/11/12 0449 04/11/12 1100 04/11/12 1300  BP: 169/96 145/78  149/86  Pulse: 71 81  78  Temp: 98.1 F (36.7 C) 98.3 F (36.8 C)  97.6 F (36.4 C)  TempSrc: Oral Oral  Oral  Resp: 17 17  18   Height:      Weight:  57.3 kg (126 lb 5.2 oz) 47.8 kg (105 lb 6.1 oz)   SpO2: 96% 95%  94%   Weight change: 0 kg (0 lb)  Intake/Output Summary (Last 24 hours) at 04/11/12 1530 Last data filed at 04/11/12 0600  Gross per 24 hour  Intake    100 ml  Output      0 ml  Net    100 ml   Labs: Basic Metabolic Panel:  Lab 04/10/12 1610 04/08/12 0800 04/08/12 0639 04/06/12 1330  NA 140 -- 141 140  K 4.9 -- 5.1 5.2*  CL 94* -- 91* 95*  CO2 20 -- 23 21  GLUCOSE 76 -- 78 86  BUN 84* -- 91* 68*  CREATININE 11.92* -- 13.53* 14.40*  CALCIUM 9.8 -- 10.3 9.8  ALB -- -- -- --  PHOS 12.0* 12.3* -- 7.2*   Liver Function Tests:  Lab 04/10/12 0659 04/08/12 0800 04/06/12 1330  AST -- -- --  ALT -- -- --  ALKPHOS -- -- --  BILITOT -- -- --  PROT -- -- --  ALBUMIN 3.2* 3.5 3.6   No results found for this basename: LIPASE:3,AMYLASE:3 in the last 168 hours No results found for this basename: AMMONIA:3 in the last 168 hours CBC:  Lab 04/10/12 0659 04/08/12 0639 04/06/12 0544  WBC 7.2 8.9 10.1  NEUTROABS -- -- --  HGB 15.1 16.0 16.1  HCT 43.9 47.1 48.7  MCV 83.3 85.0 86.0  PLT 139* 114* 87*   Cardiac Enzymes: No results found for this basename: CKTOTAL:5,CKMB:5,CKMBINDEX:5,TROPONINI:5 in the last 168 hours CBG: No results found for this basename: GLUCAP:5 in the last 168 hours  Iron Studies: No results found for this basename: IRON,TIBC,TRANSFERRIN,FERRITIN in the last 72 hours Studies/Results: Dg Abd 1 View  04/11/2012  *RADIOLOGY REPORT*  Clinical Data:  small bowel obstruction  ABDOMEN - 1 VIEW  Comparison: 04/08/2012  Findings: Persistent small bowel dilatation in  the left mid abdomen measuring 4.6 cm in diameter.  Retained contrast and stool in the the right colon.  Minimal improvement compared to 04/08/2012. Postop changes of the pelvis.  Vascular calcifications present.  IMPRESSION: Some improvement in partial small bowel obstruction pattern.  Original Report Authenticated By: Judie Petit. Ruel Favors, M.D.   Medications:      . amLODipine  5 mg Oral Daily  . diphenhydrAMINE  12.5 mg Intravenous Once  . lanthanum  2,000 mg Oral TID WC  . ondansetron (ZOFRAN) IV  4 mg Intravenous Once  . polyethylene glycol  17 g Oral Once  . predniSONE  5 mg Oral Q breakfast    I  have reviewed scheduled and prn medications.  Physical Exam:  General: looks tired Heart: RRR  Lungs: CTAB  Abdomen: NGT intact but clamped; abd NT/ND, hypoactive B.S.  Extremities: no ankle edema  Dialysis Access: left thigh AVG +T/B   Dialysis Orders: Center: [facility] on TTS.  EDW 52.5 kg HD Bath 2K/2Ca Time 3.5 hrs Heparin 5000 U. Access AVG @ left thigh BFR 400 DFR 800 Zemplar 0 mcg  IV/HD Epogen 0 Units IV/HD Venofer 0.   Impression/Plan 1. SBO- per surgery/primary 2. Volume depletion- symptomatic, s/p saline bolus 7/19. Better. No fluid off w HD today. 3. ESRD - HD TTS at [facility]; HD today. 4kg under dry weight, no UF with HD needed. Not eating. 4. Hypertension/volume - was not on any BP medicine at home. Had labetalol and norvasc started here for high BP then stopped for hypotension. Resumed low dose Norvasc (7/20).  5. Anemia - Hgb 16.1, no Epo or Fe, same as outpt. 6. Metabolic bone disease - Ca 10.3, phos 12.3 despite NPO status. Says he just recently changed from Phoslo to Fosrenol 2 gm ac tid due to new insurance and financial concerns. D/C phoslo and start Fosrenol. 7. Nutrition -Alb 3.5; NPO; follow. 8. S/p renal transplant - at [facility] in '87, started HD in 04/2003, remains on Prednisone.  9. Hepatitis C/thrombocytopenia- plt 114; no heparin with HD and follow 10. Disposition- per  primary   Stephen Moselle  MD [facility] [phone] pgr    [phone] cell 04/11/2012, 3:30 PM

## 2012-04-11 NOTE — Progress Notes (Signed)
PATIENT DETAILS Name: [name] Age: 51 y.o. Sex: male Date of Birth: [birth year] Admit Date: 04/02/2012 UJW:[email or web address],FAOZH L, MD  Subjective: No Flatus since yesterday am 2 episodes of vomiting yesterday No BM yet  Objective: Vital signs in last 24 hours: Filed Vitals:   04/10/12 1100 04/10/12 1500 04/10/12 2115 04/11/12 0449  BP: 157/92 175/82 169/96 145/78  Pulse: 70 71 71 81  Temp: 97.3 F (36.3 C) 97.7 F (36.5 C) 98.1 F (36.7 C) 98.3 F (36.8 C)  TempSrc: Oral Oral Oral Oral  Resp: 18 18 17 17   Height:      Weight:    57.3 kg (126 lb 5.2 oz)  SpO2: 96% 96% 96% 95%    Assessment/Plan: Active Problems:  SBO (small bowel obstruction) -no flatus since yesterday-2 episodes of vomting as well. -await [facility] eval-will defer further management to them -NG  clamped on 7/17, removed 7/18 -  ESRD (end stage renal disease) -per Renal -HD TTS  HTN -BP uncontrolled this am-amlodipine restarted by renal-BP very fluctuant-will monitor  Chronic Gout -prednisone-chronic  Thrombocytopenia -Chronic-but almost back to normal-monitor CBC periodically -likely from Hep C -monitor platelet count while on prophylactic heparin-decreasing- stopped heparin 7/18  Disposition: -remain inpatient  DVT Prophylaxis: -stop prophylactic heparin 7/18,platelet-now stable-placed on SCD's  Code Status: -Full Code  Intake/Output from previous day:  Intake/Output Summary (Last 24 hours) at 04/11/12 0917 Last data filed at 04/11/12 0600  Gross per 24 hour  Intake    362 ml  Output      0 ml  Net    362 ml    PHYSICAL EXAM: Weight change: 0 kg (0 lb) Body mass index is 17.62 kg/(m^2).  Gen Exam: Awake and alert with clear speech.  Neck: Supple, No JVD.   Chest: B/L Clear.   CVS: S1 S2 Regular, no murmurs.  Abdomen: soft, BS +, non tender, non distended.  Extremities: no edema, lower extremities warm to touch. Neurologic: Non Focal.   Skin: No Rash.   Wounds: N/A.     CONSULTS:  nephrology and general surgery  LAB RESULTS: CBC  Lab 04/10/12 0659 04/08/12 0639 04/06/12 0544  WBC 7.2 8.9 10.1  HGB 15.1 16.0 16.1  HCT 43.9 47.1 48.7  PLT 139* 114* 87*  MCV 83.3 85.0 86.0  MCH 28.7 28.9 28.4  MCHC 34.4 34.0 33.1  RDW 14.1 14.6 15.1  LYMPHSABS -- -- --  MONOABS -- -- --  EOSABS -- -- --  BASOSABS -- -- --  BANDABS -- -- --    Chemistries   Lab 04/10/12 0659 04/08/12 0639 04/06/12 1330  NA 140 141 140  K 4.9 5.1 5.2*  CL 94* 91* 95*  CO2 20 23 21   GLUCOSE 76 78 86  BUN 84* 91* 68*  CREATININE 11.92* 13.53* 14.40*  CALCIUM 9.8 10.3 9.8  MG -- -- --    CBG: No results found for this basename: GLUCAP:5 in the last 168 hours  GFR Estimated Creatinine Clearance: 5.9 ml/min (by C-G formula based on Cr of 11.92).  Coagulation profile No results found for this basename: INR:5,PROTIME:5 in the last 168 hours  Cardiac Enzymes No results found for this basename: CK:3,CKMB:3,TROPONINI:3,MYOGLOBIN:3 in the last 168 hours  No components found with this basename: POCBNP:3 No results found for this basename: DDIMER:2 in the last 72 hours No results found for this basename: HGBA1C:2 in the last 72 hours No results found for this basename: CHOL:2,HDL:2,LDLCALC:2,TRIG:2,CHOLHDL:2,LDLDIRECT:2 in the last 72 hours No results found  for this basename: TSH,T4TOTAL,FREET3,T3FREE,THYROIDAB in the last 72 hours No results found for this basename: VITAMINB12:2,FOLATE:2,FERRITIN:2,TIBC:2,IRON:2,RETICCTPCT:2 in the last 72 hours No results found for this basename: LIPASE:2,AMYLASE:2 in the last 72 hours  Urine Studies No results found for this basename: UACOL:2,UAPR:2,USPG:2,UPH:2,UTP:2,UGL:2,UKET:2,UBIL:2,UHGB:2,UNIT:2,UROB:2,ULEU:2,UEPI:2,UWBC:2,URBC:2,UBAC:2,CAST:2,CRYS:2,UCOM:2,BILUA:2 in the last 72 hours  MICROBIOLOGY: Recent Results (from the past 240 hour(s))  MRSA PCR SCREENING     Status: Normal   Collection Time   04/03/12 10:10 AM       Component Value Range Status Comment   MRSA by PCR NEGATIVE  NEGATIVE Final     RADIOLOGY STUDIES/RESULTS: Ct Abdomen Pelvis Wo Contrast  04/03/2012  *RADIOLOGY REPORT*  Clinical Data: Abdominal pain  CT ABDOMEN AND PELVIS WITHOUT CONTRAST  Technique:  Multidetector CT imaging of the abdomen and pelvis was performed following the standard protocol without intravenous contrast.  Comparison: 11/01/2004  Findings: Coronary artery calcification.  Linear opacity at the left lung base.  Organ abnormality/lesion detection is limited in the absence of intravenous contrast. Within this limitation, unremarkable liver, biliary system, spleen, adrenal glands. There is dilatation of the main pancreatic duct, measuring up to 5 mm.  Atrophic native kidneys with bilateral cystic lesions of varying complexity.  Transplant kidney left lower quadrant with sinus lipomatosis and cortical thinning.  There is a loop of small bowel distended up to 3.3 cm and demonstrating air fluid level.  Distal small bowel is decompressed.  Colonic diverticulosis.  No CT evidence for diverticulitis  No free intraperitoneal air or fluid.  No lymphadenopathy.  Advanced atherosclerotic disease of the aorta and branch vessels.  Decompressed bladder.  Surgical clips in the pelvis.  Partially imaged left femoral bypass.  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 Unchanged cystic and sclerotic changes of the right femoral neck.  No acute osseous finding.  IMPRESSION: Dilated small bowel loops up to 3.3 cm with air-fluid levels. Decompressed bowel loops distally.  This may reflect an early small bowel obstruction.  Recommend correlation with symptoms and if no intervention, follow-up KUB to document passage of contrast into the colon.  Advanced atherosclerotic disease.  Transplant kidney left lower quadrant with prominent renal sinus fat, similar to prior.  Bilateral native kidneys with incompletely characterized cystic lesions of varying  complexity.  Consider follow-up renal MRI.  Dilated main pancreatic duct up to 5 mm.  No obstructing lesion visualized by CT.  This can also be better characterized at the time of MR.  Original Report Authenticated By: Waneta Martins, M.D.   Abd 1 View (kub)  04/05/2012  *RADIOLOGY REPORT*  Clinical Data: Follow-up small bowel obstruction.  ABDOMEN - 1 VIEW  Comparison: 04/03/2012  Findings: Dilated lower abdominal small bowel loops are again noted compatible with small bowel obstruction.  Likely no significant change since prior CT.  No free air.  Surgical clips in the pelvis. No acute bony abnormality.  IMPRESSION: Stable small bowel dilatation in the lower abdomen, likely no significant change.  Original Report Authenticated By: Cyndie Chime, M.D.   Dg Abd 2 Views  04/06/2012  *RADIOLOGY REPORT*  Clinical Data: Small bowel obstruction.  Abdominal pain.  ABDOMEN - 2 VIEW  Comparison: Radiographs dated 04/05/2012 and CT scan dated 04/03/2012  Findings: There is persistent dilatation of multiple small bowel loops.  The contrast given for the prior CT scan remains in these dilated small bowel loops.  No contrast in the nondistended colon.  No free air in the upright radiograph.  IMPRESSION: Persistent small bowel obstruction, unchanged.  Original  Report Authenticated By: Gwynn Burly, M.D.    MEDICATIONS: Scheduled Meds:    . amLODipine  5 mg Oral Daily  . diphenhydrAMINE  12.5 mg Intravenous Once  . lanthanum  2,000 mg Oral TID WC  . polyethylene glycol  17 g Oral Once  . predniSONE  5 mg Oral Q breakfast   Continuous Infusions:  PRN Meds:.acetaminophen, acetaminophen, heparin, hydrALAZINE, morphine injection, ondansetron (ZOFRAN) IV, ondansetron, phenol  Antibiotics: Anti-infectives    None     Jeoffrey Massed, MD  [facility] Pager [phone]  If 7PM-7AM, please contact night-coverage [email or web address] Password [email or web address] 04/11/2012, 9:17 AM   LOS: 9 days

## 2012-04-11 NOTE — Progress Notes (Signed)
Patient ID: [name], male   DOB: March 19, 1961, 51 y.o.   MRN: [id number] [facility] Surgery Progress Note:   * No surgery found *  Subjective: Mental status is clear although he is closing his eyes and not engaging me much. In no pain or distress Objective: Vital signs in last 24 hours: Temp:  [97.3 F (36.3 C)-98.3 F (36.8 C)] 98.3 F (36.8 C) (07/21 0449) Pulse Rate:  [70-81] 81  (07/21 0449) Resp:  [17-18] 17  (07/21 0449) BP: (145-175)/(78-96) 145/78 mmHg (07/21 0449) SpO2:  [95 %-96 %] 95 % (07/21 0449) Weight:  [126 lb 5.2 oz (57.3 kg)] 126 lb 5.2 oz (57.3 kg) (07/21 0449)  Intake/Output from previous day: 07/20 0701 - 07/21 0700 In: 362 [P.O.:340; I.V.:20; IV Piggyback:2] Out: 0  Intake/Output this shift:    Physical Exam: Work of breathing is  Normal.  Abdomen is flat.  No BM in over a week.  Stool burden noted on last abdominal xray 7/18  Lab Results:  Results for orders placed during the hospital encounter of 04/02/12 (from the past 48 hour(s))  CBC     Status: Abnormal   Collection Time   04/10/12  6:59 AM      Component Value Range Comment   WBC 7.2  4.0 - 10.5 K/uL    RBC 5.27  4.22 - 5.81 MIL/uL    Hemoglobin 15.1  13.0 - 17.0 g/dL    HCT 14.7  82.9 - 56.2 %    MCV 83.3  78.0 - 100.0 fL    MCH 28.7  26.0 - 34.0 pg    MCHC 34.4  30.0 - 36.0 g/dL    RDW 13.0  86.5 - 78.4 %    Platelets 139 (*) 150 - 400 K/uL   RENAL FUNCTION PANEL     Status: Abnormal   Collection Time   04/10/12  6:59 AM      Component Value Range Comment   Sodium 140  135 - 145 mEq/L    Potassium 4.9  3.5 - 5.1 mEq/L    Chloride 94 (*) 96 - 112 mEq/L    CO2 20  19 - 32 mEq/L    Glucose, Bld 76  70 - 99 mg/dL    BUN 84 (*) 6 - 23 mg/dL    Creatinine, Ser 69.62 (*) 0.50 - 1.35 mg/dL    Calcium 9.8  8.4 - 95.2 mg/dL    Phosphorus 84.1 (*) 2.3 - 4.6 mg/dL    Albumin 3.2 (*) 3.5 - 5.2 g/dL    GFR calc non Af Amer 4 (*) >90 mL/min    GFR calc Af Amer 5 (*) >90 mL/min      Radiology/Results: No results found.  Anti-infectives: Anti-infectives    None      Assessment/Plan: Problem List: Patient Active Problem List  Diagnosis  . SBO (small bowel obstruction)  . ESRD (end stage renal disease)  . Erythrocytosis    Will give soap suds enema now. Repeat xray this afternoon to assess * No surgery found *    LOS: 9 days   Matt B. Daphine Deutscher, MD, [facility] [phone] beeper [phone]  04/11/2012 10:56 AM

## 2012-04-11 NOTE — Progress Notes (Signed)
Pt requests to have soap suds enema this afternoon are his company visits today. RN to give enema later today, per patients request.  Olene Craven

## 2012-04-12 ENCOUNTER — Inpatient Hospital Stay (HOSPITAL_COMMUNITY): Payer: Medicare Other

## 2012-04-12 LAB — CBC
Hemoglobin: 15.4 g/dL (ref 13.0–17.0)
MCHC: 33.7 g/dL (ref 30.0–36.0)

## 2012-04-12 LAB — BASIC METABOLIC PANEL
GFR calc Af Amer: 5 mL/min — ABNORMAL LOW (ref >90)
GFR calc non Af Amer: 4 mL/min — ABNORMAL LOW (ref >90)
Glucose, Bld: 70 mg/dL (ref 70–99)
Potassium: 4.8 mEq/L (ref 3.5–5.1)
Sodium: 136 mEq/L (ref 135–145)

## 2012-04-12 MED ORDER — HEPARIN SODIUM (PORCINE) 1000 UNIT/ML DIALYSIS
20.0000 [IU]/kg | INTRAMUSCULAR | Status: DC | PRN
  Filled 2012-04-12: qty 1

## 2012-04-12 MED ORDER — POLYETHYLENE GLYCOL 3350 17 G PO PACK
17.0000 g | PACK | Freq: Two times a day (BID) | ORAL | Status: DC
  Administered 2012-04-12: 17 g via ORAL
  Filled 2012-04-12 (×6): qty 1

## 2012-04-12 NOTE — Progress Notes (Signed)
  Subjective: No nausea or vomiting.  No pain.  Walking.  Objective: Vital signs in last 24 hours: Temp:  [97.6 F (36.4 C)-98.2 F (36.8 C)] 98.2 F (36.8 C) (07/22 0534) Pulse Rate:  [78-81] 78  (07/22 0534) Resp:  [18] 18  (07/22 0534) BP: (137-149)/(76-86) 149/83 mmHg (07/22 0534) SpO2:  [94 %-95 %] 95 % (07/22 0534) Weight:  [103 lb 12.8 oz (47.083 kg)-105 lb 6.1 oz (47.8 kg)] 103 lb 12.8 oz (47.083 kg) (07/22 0635) Last BM Date: 04/11/12  Intake/Output from previous day: 07/21 0701 - 07/22 0700 In: 100 [P.O.:100] Out: -  Intake/Output this shift:    GI: soft, non-tender; bowel sounds normal; no masses,  no organomegaly no distension.  Lab Results:   [facility] 04/12/12 0620 04/10/12 0659  WBC 7.9 7.2  HGB 15.4 15.1  HCT 45.7 43.9  PLT 123* 139*   BMET  Basename 04/12/12 0620 04/10/12 0659  NA 136 140  K 4.8 4.9  CL 90* 94*  CO2 22 20  GLUCOSE 70 76  BUN 60* 84*  CREATININE 11.38* 11.92*  CALCIUM 9.8 9.8   PT/INR No results found for this basename: LABPROT:2,INR:2 in the last 72 hours ABG No results found for this basename: PHART:2,PCO2:2,PO2:2,HCO3:2 in the last 72 hours  Studies/Results: Dg Abd 1 View  04/11/2012  *RADIOLOGY REPORT*  Clinical Data:  small bowel obstruction  ABDOMEN - 1 VIEW  Comparison: 04/08/2012  Findings: Persistent small bowel dilatation in the left mid abdomen measuring 4.6 cm in diameter.  Retained contrast and stool in the the right colon.  Minimal improvement compared to 04/08/2012. Postop changes of the pelvis.  Vascular calcifications present.  IMPRESSION: Some improvement in partial small bowel obstruction pattern.  Original Report Authenticated By: Judie Petit. Ruel Favors, M.D.    Anti-infectives: Anti-infectives    None      Assessment/Plan: Partial small bowel obstruction.  No more nausea or vomiting.  Denies abdominal pain.  KUB yesterday improved. Cont liquid diet and repeat enema today.  Looks quite well.  Long discussion  with patient about his condition.  If not able to advance diet,  May still need ex lap.    LOS: 10 days    Ercel Pepitone A. 04/12/2012

## 2012-04-12 NOTE — Progress Notes (Signed)
PATIENT DETAILS Name: [name] Age: 51 y.o. Sex: male Date of Birth: 07-11-1961 Admit Date: 04/02/2012 ZOX:[id number] L, MD  Subjective: BM yesterday after soap suds enema-no flatus or BM since then  Objective: Vital signs in last 24 hours: Filed Vitals:   04/11/12 1300 04/11/12 2100 04/12/12 0534 04/12/12 0635  BP: 149/86 137/76 149/83   Pulse: 78 81 78   Temp: 97.6 F (36.4 C) 98.1 F (36.7 C) 98.2 F (36.8 C)   TempSrc: Oral Oral Oral   Resp: 18 18 18    Height:      Weight:    47.083 kg (103 lb 12.8 oz)  SpO2: 94% 95% 95%     Assessment/Plan: Active Problems:  SBO (small bowel obstruction) -slow to minimal improvement continues, will advance to full liquids, and start some Miralax -NG  clamped on 7/17, removed 7/18   ESRD (end stage renal disease) -per Renal -HD TTS  HTN -BP uncontrolled this am-amlodipine restarted by renal-BP very fluctuant-will monitor  Chronic Gout -prednisone-chronic  Thrombocytopenia -Chronic-but almost back to normal-monitor CBC periodically -likely from Hep C -monitor platelet count while on prophylactic heparin-decreasing- stopped heparin 7/18  Disposition: -remain inpatient  DVT Prophylaxis: -stop prophylactic heparin 7/18,platelet-now stable-placed on SCD's  Code Status: -Full Code  Intake/Output from previous day:  Intake/Output Summary (Last 24 hours) at 04/12/12 1156 Last data filed at 04/12/12 0900  Gross per 24 hour  Intake    220 ml  Output      0 ml  Net    220 ml    PHYSICAL EXAM: Weight change: -0.9 kg (-1 lb 15.7 oz) Body mass index is 14.48 kg/(m^2).  Gen Exam: Awake and alert with clear speech.  Neck: Supple, No JVD.   Chest: B/L Clear.   CVS: S1 S2 Regular, no murmurs.  Abdomen: soft, BS +, non tender, non distended.  Extremities: no edema, lower extremities warm to touch. Neurologic: Non Focal.   Skin: No Rash.   Wounds: N/A.    CONSULTS:  nephrology and general surgery  LAB  RESULTS: CBC  Lab 04/12/12 0620 04/10/12 0659 04/08/12 0639 04/06/12 0544  WBC 7.9 7.2 8.9 10.1  HGB 15.4 15.1 16.0 16.1  HCT 45.7 43.9 47.1 48.7  PLT 123* 139* 114* 87*  MCV 86.2 83.3 85.0 86.0  MCH 29.1 28.7 28.9 28.4  MCHC 33.7 34.4 34.0 33.1  RDW 14.4 14.1 14.6 15.1  LYMPHSABS -- -- -- --  MONOABS -- -- -- --  EOSABS -- -- -- --  BASOSABS -- -- -- --  BANDABS -- -- -- --    Chemistries   Lab 04/12/12 0620 04/10/12 0659 04/08/12 0639 04/06/12 1330  NA 136 140 141 140  K 4.8 4.9 5.1 5.2*  CL 90* 94* 91* 95*  CO2 22 20 23 21   GLUCOSE 70 76 78 86  BUN 60* 84* 91* 68*  CREATININE 11.38* 11.92* 13.53* 14.40*  CALCIUM 9.8 9.8 10.3 9.8  MG -- -- -- --    CBG: No results found for this basename: GLUCAP:5 in the last 168 hours  GFR Estimated Creatinine Clearance: 5.1 ml/min (by C-G formula based on Cr of 11.38).  Coagulation profile No results found for this basename: INR:5,PROTIME:5 in the last 168 hours  Cardiac Enzymes No results found for this basename: CK:3,CKMB:3,TROPONINI:3,MYOGLOBIN:3 in the last 168 hours  No components found with this basename: POCBNP:3 No results found for this basename: DDIMER:2 in the last 72 hours No results found for this basename: HGBA1C:2  in the last 72 hours No results found for this basename: CHOL:2,HDL:2,LDLCALC:2,TRIG:2,CHOLHDL:2,LDLDIRECT:2 in the last 72 hours No results found for this basename: TSH,T4TOTAL,FREET3,T3FREE,THYROIDAB in the last 72 hours No results found for this basename: VITAMINB12:2,FOLATE:2,FERRITIN:2,TIBC:2,IRON:2,RETICCTPCT:2 in the last 72 hours No results found for this basename: LIPASE:2,AMYLASE:2 in the last 72 hours  Urine Studies No results found for this basename: UACOL:2,UAPR:2,USPG:2,UPH:2,UTP:2,UGL:2,UKET:2,UBIL:2,UHGB:2,UNIT:2,UROB:2,ULEU:2,UEPI:2,UWBC:2,URBC:2,UBAC:2,CAST:2,CRYS:2,UCOM:2,BILUA:2 in the last 72 hours  MICROBIOLOGY: Recent Results (from the past 240 hour(s))  MRSA PCR SCREENING      Status: Normal   Collection Time   04/03/12 10:10 AM      Component Value Range Status Comment   MRSA by PCR NEGATIVE  NEGATIVE Final     RADIOLOGY STUDIES/RESULTS: Ct Abdomen Pelvis Wo Contrast  04/03/2012  *RADIOLOGY REPORT*  Clinical Data: Abdominal pain  CT ABDOMEN AND PELVIS WITHOUT CONTRAST  Technique:  Multidetector CT imaging of the abdomen and pelvis was performed following the standard protocol without intravenous contrast.  Comparison: 11/01/2004  Findings: Coronary artery calcification.  Linear opacity at the left lung base.  Organ abnormality/lesion detection is limited in the absence of intravenous contrast. Within this limitation, unremarkable liver, biliary system, spleen, adrenal glands. There is dilatation of the main pancreatic duct, measuring up to 5 mm.  Atrophic native kidneys with bilateral cystic lesions of varying complexity.  Transplant kidney left lower quadrant with sinus lipomatosis and cortical thinning.  There is a loop of small bowel distended up to 3.3 cm and demonstrating air fluid level.  Distal small bowel is decompressed.  Colonic diverticulosis.  No CT evidence for diverticulitis  No free intraperitoneal air or fluid.  No lymphadenopathy.  Advanced atherosclerotic disease of the aorta and branch vessels.  Decompressed bladder.  Surgical clips in the pelvis.  Partially imaged left femoral bypass.  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 Unchanged cystic and sclerotic changes of the right femoral neck.  No acute osseous finding.  IMPRESSION: Dilated small bowel loops up to 3.3 cm with air-fluid levels. Decompressed bowel loops distally.  This may reflect an early small bowel obstruction.  Recommend correlation with symptoms and if no intervention, follow-up KUB to document passage of contrast into the colon.  Advanced atherosclerotic disease.  Transplant kidney left lower quadrant with prominent renal sinus fat, similar to prior.  Bilateral native kidneys  with incompletely characterized cystic lesions of varying complexity.  Consider follow-up renal MRI.  Dilated main pancreatic duct up to 5 mm.  No obstructing lesion visualized by CT.  This can also be better characterized at the time of MR.  Original Report Authenticated By: Waneta Martins, M.D.   Abd 1 View (kub)  04/05/2012  *RADIOLOGY REPORT*  Clinical Data: Follow-up small bowel obstruction.  ABDOMEN - 1 VIEW  Comparison: 04/03/2012  Findings: Dilated lower abdominal small bowel loops are again noted compatible with small bowel obstruction.  Likely no significant change since prior CT.  No free air.  Surgical clips in the pelvis. No acute bony abnormality.  IMPRESSION: Stable small bowel dilatation in the lower abdomen, likely no significant change.  Original Report Authenticated By: Cyndie Chime, M.D.   Dg Abd 2 Views  04/06/2012  *RADIOLOGY REPORT*  Clinical Data: Small bowel obstruction.  Abdominal pain.  ABDOMEN - 2 VIEW  Comparison: Radiographs dated 04/05/2012 and CT scan dated 04/03/2012  Findings: There is persistent dilatation of multiple small bowel loops.  The contrast given for the prior CT scan remains in these dilated small bowel loops.  No contrast in  the nondistended colon.  No free air in the upright radiograph.  IMPRESSION: Persistent small bowel obstruction, unchanged.  Original Report Authenticated By: Gwynn Burly, M.D.    MEDICATIONS: Scheduled Meds:    . amLODipine  5 mg Oral Daily  . diphenhydrAMINE  12.5 mg Intravenous Once  . lanthanum  2,000 mg Oral TID WC  . polyethylene glycol  17 g Oral BID  . predniSONE  5 mg Oral Q breakfast  . DISCONTD: polyethylene glycol  17 g Oral Once   Continuous Infusions:  PRN Meds:.acetaminophen, acetaminophen, hydrALAZINE, morphine injection, ondansetron (ZOFRAN) IV, ondansetron, phenol, DISCONTD: heparin, DISCONTD: heparin  Antibiotics: Anti-infectives    None     Jeoffrey Massed, MD  [facility] Pager [phone]  If 7PM-7AM, please contact night-coverage [email or web address] Password [facility] 04/12/2012, 11:56 AM   LOS: 10 days

## 2012-04-12 NOTE — Progress Notes (Addendum)
Impression/Plan  1. SBO- per surgery/primary. 2. ESRD - HD TTS at [facility]. Below dry weight..Not eating much. Next HD Tuesday 3. Hypertension/volume - Norvasc 4. Anemia - Hgb 16.1, no Epo or Fe, same as outpt. 5. Metabolic bone disease - Ca 10.3, phos 12.3 despite NPO status. Says he just recently changed from Phoslo to Fosrenol 2 gm ac tid due to new insurance and financial concerns. On Fosrenol. 6. Nutrition -Alb 3.5; NPO; follow. 7. S/p renal transplant - at [facility] in '87, started HD in 04/2003, remains on Prednisone.  8. Hepatitis C/thrombocytopenia- plt 114. 9. Disposition- per primary  Subjective: Interval History: BM yesterday  Objective: Vital signs in last 24 hours: Temp:  [97.6 F (36.4 C)-98.2 F (36.8 C)] 98.2 F (36.8 C) (07/22 0534) Pulse Rate:  [78-81] 78  (07/22 0534) Resp:  [18] 18  (07/22 0534) BP: (137-149)/(76-86) 149/83 mmHg (07/22 0534) SpO2:  [94 %-95 %] 95 % (07/22 0534) Weight:  [47.083 kg (103 lb 12.8 oz)] 47.083 kg (103 lb 12.8 oz) (07/22 0635) Weight change: -0.9 kg (-1 lb 15.7 oz)  Intake/Output from previous day: 07/21 0701 - 07/22 0700 In: 100 [P.O.:100] Out: -  Intake/Output this shift: Total I/O In: 120 [P.O.:120] Out: -   General appearance: alert, cooperative and appears stated age Head: Normocephalic, without obvious abnormality, atraumatic Resp: clear to auscultation bilaterally Chest wall: no tenderness Cardio: regular rate and rhythm Extremities: extremities normal, atraumatic, no cyanosis or edema Abdomen few bowel sounds noted, soft  Lab Results:  [facility] 04/12/12 0620 04/10/12 0659  WBC 7.9 7.2  HGB 15.4 15.1  HCT 45.7 43.9  PLT 123* 139*   BMET:  Basename 04/12/12 0620 04/10/12 0659  NA 136 140  K 4.8 4.9  CL 90* 94*  CO2 22 20  GLUCOSE 70 76  BUN 60* 84*  CREATININE 11.38* 11.92*  CALCIUM 9.8 9.8   No results found for this basename: PTH:2 in the last 72 hours Iron Studies: No results found for this basename:  IRON,TIBC,TRANSFERRIN,FERRITIN in the last 72 hours Studies/Results: Dg Abd 1 View  04/12/2012  *RADIOLOGY REPORT*  Clinical Data: Abdominal pain, constipation, nausea and vomiting  ABDOMEN - 1 VIEW  Comparison: 04/11/2012; 04/08/2012; 04/06/2012; chest CT of 04/03/2012  Findings:  No change to minimal decrease in gaseous distension of several loops of small bowel with index loop within the right mid hemiabdomen measuring approximately 3.9 cm in diameter.  Minimal transit of previously ingested oral contrast with enteric contrast now seen within the splenic flexure of the colon. Evaluation pneumoperitoneum is limited secondary to supine patient positioning.  No definite pneumatosis or portal venous gas.  Vascular calcifications overlie the left upper abdominal quadrant. Multiple surgical clips overlie the pelvis.  Grossly unchanged bones.  IMPRESSION: No change to minimal decrease in findings compatible with partial small bowel obstruction.  Original Report Authenticated By: Waynard Reeds, M.D.   Dg Abd 1 View  04/11/2012  *RADIOLOGY REPORT*  Clinical Data:  small bowel obstruction  ABDOMEN - 1 VIEW  Comparison: 04/08/2012  Findings: Persistent small bowel dilatation in the left mid abdomen measuring 4.6 cm in diameter.  Retained contrast and stool in the the right colon.  Minimal improvement compared to 04/08/2012. Postop changes of the pelvis.  Vascular calcifications present.  IMPRESSION: Some improvement in partial small bowel obstruction pattern.  Original Report Authenticated By: Judie Petit. Ruel Favors, M.D.   Scheduled:   . amLODipine  5 mg Oral Daily  . diphenhydrAMINE  12.5 mg  Intravenous Once  . lanthanum  2,000 mg Oral TID WC  . ondansetron (ZOFRAN) IV  4 mg Intravenous Once  . polyethylene glycol  17 g Oral BID  . predniSONE  5 mg Oral Q breakfast  . DISCONTD: polyethylene glycol  17 g Oral Once    LOS: 10 days   Camya Haydon C 04/12/2012,11:29 AM

## 2012-04-13 ENCOUNTER — Inpatient Hospital Stay (HOSPITAL_COMMUNITY): Payer: Medicare Other

## 2012-04-13 ENCOUNTER — Inpatient Hospital Stay (HOSPITAL_COMMUNITY): Admit: 2012-04-13 | Discharge: 2012-04-13 | Disposition: A | Discharge status: Home / Self Care | Payer: Medicare Other

## 2012-04-13 LAB — RENAL FUNCTION PANEL
Albumin: 3.4 g/dL — ABNORMAL LOW (ref 3.5–5.2)
Chloride: 87 mEq/L — ABNORMAL LOW (ref 96–112)
GFR calc Af Amer: 4 mL/min — ABNORMAL LOW (ref >90)
GFR calc non Af Amer: 3 mL/min — ABNORMAL LOW (ref >90)
Potassium: 4.9 mEq/L (ref 3.5–5.1)

## 2012-04-13 LAB — CBC
Platelets: 151 10*3/uL (ref 150–400)
RBC: 5.23 MIL/uL (ref 4.22–5.81)
WBC: 8 10*3/uL (ref 4.0–10.5)

## 2012-04-13 MED ORDER — PROMETHAZINE HCL 25 MG/ML IJ SOLN
12.5000 mg | Freq: Four times a day (QID) | INTRAMUSCULAR | Status: DC | PRN
  Filled 2012-04-13: qty 1

## 2012-04-13 MED ORDER — ONDANSETRON HCL 4 MG/2ML IJ SOLN
INTRAMUSCULAR | Status: AC
  Administered 2012-04-13: 4 mg
  Filled 2012-04-13: qty 2

## 2012-04-13 MED ORDER — MORPHINE SULFATE 4 MG/ML IJ SOLN
INTRAMUSCULAR | Status: AC
  Filled 2012-04-13: qty 1

## 2012-04-13 MED ORDER — SODIUM CHLORIDE 0.9 % IV SOLN
1.0000 g | INTRAVENOUS | Status: DC
  Filled 2012-04-13 (×2): qty 1

## 2012-04-13 NOTE — Progress Notes (Signed)
  Subjective: No nausea or vomiting.  No BM or flatus. Not any better.  Objective: Vital signs in last 24 hours: Temp:  [97.1 F (36.2 C)-98.5 F (36.9 C)] 97.1 F (36.2 C) (07/23 0641) Pulse Rate:  [75-89] 81  (07/23 0900) Resp:  [17-20] 20  (07/23 0900) BP: (136-174)/(75-93) 157/85 mmHg (07/23 0900) SpO2:  [93 %-99 %] 93 % (07/23 0551) Weight:  [106 lb 3.2 oz (48.172 kg)-106 lb 4.2 oz (48.2 kg)] 106 lb 4.2 oz (48.2 kg) (07/23 0641) Last BM Date: 04/12/12 (spoke with patient/reports bm on or close to admission date)  Intake/Output from previous day: 07/22 0701 - 07/23 0700 In: 420 [P.O.:420] Out: -  Intake/Output this shift:    GI: min tenderness slightly more distended.  no rebound or guarding.   Lab Results:   Basename 04/13/12 0650 04/12/12 0620  WBC 8.0 7.9  HGB 15.1 15.4  HCT 44.0 45.7  PLT 151 123*   BMET  Basename 04/13/12 0650 04/12/12 0620  NA 137 136  K 4.9 4.8  CL 87* 90*  CO2 24 22  GLUCOSE 79 70  BUN 79* 60*  CREATININE 14.26* 11.38*  CALCIUM 9.6 9.8   PT/INR No results found for this basename: LABPROT:2,INR:2 in the last 72 hours ABG No results found for this basename: PHART:2,PCO2:2,PO2:2,HCO3:2 in the last 72 hours  Studies/Results: Dg Abd 1 View  04/12/2012  *RADIOLOGY REPORT*  Clinical Data: Abdominal pain, constipation, nausea and vomiting  ABDOMEN - 1 VIEW  Comparison: 04/11/2012; 04/08/2012; 04/06/2012; chest CT of 04/03/2012  Findings:  No change to minimal decrease in gaseous distension of several loops of small bowel with index loop within the right mid hemiabdomen measuring approximately 3.9 cm in diameter.  Minimal transit of previously ingested oral contrast with enteric contrast now seen within the splenic flexure of the colon. Evaluation pneumoperitoneum is limited secondary to supine patient positioning.  No definite pneumatosis or portal venous gas.  Vascular calcifications overlie the left upper abdominal quadrant. Multiple  surgical clips overlie the pelvis.  Grossly unchanged bones.  IMPRESSION: No change to minimal decrease in findings compatible with partial small bowel obstruction.  Original Report Authenticated By: JOHN A. WATTS V, M.D.   Dg Abd 1 View  04/11/2012  *RADIOLOGY REPORT*  Clinical Data:  small bowel obstruction  ABDOMEN - 1 VIEW  Comparison: 04/08/2012  Findings: Persistent small bowel dilatation in the left mid abdomen measuring 4.6 cm in diameter.  Retained contrast and stool in the the right colon.  Minimal improvement compared to 04/08/2012. Postop changes of the pelvis.  Vascular calcifications present.  IMPRESSION: Some improvement in partial small bowel obstruction pattern.  Original Report Authenticated By: M. TREVOR SHICK, M.D.    Anti-infectives: Anti-infectives    None      Assessment/Plan: Persistent partial SBO with no improvement over last 24  hour  Ex lap on wed.  High operative risk.  Risk of bleeding,  Infection,  Death,  Wound problems,  Fistula ,  Organ injury,  Worsening of medical problems and further surgery.  Non operative options discussed.  Risks understood and he would like to proceed.  LOS: 11 days    Stephen Mcneary A. 04/13/2012  

## 2012-04-13 NOTE — Progress Notes (Addendum)
PATIENT DETAILS Name: [name] Age: 51 y.o. Sex: male Date of Birth: 11/14/1960 Admit Date: 04/02/2012 [id number],UXLKG L, MD  Subjective: No BM or Flatus  Objective: Vital signs in last 24 hours: Filed Vitals:   04/13/12 1014 04/13/12 1043 04/13/12 1302 04/13/12 1400  BP: 171/85 164/89 129/63 154/94  Pulse: 93 86  90  Temp:  96.9 F (36.1 C)  97.6 F (36.4 C)  TempSrc:  Oral  Oral  Resp: 17 18  18   Height:      Weight:  47.4 kg (104 lb 8 oz)    SpO2:  93%  96%    Assessment/Plan: Active Problems:  SBO (small bowel obstruction) - minimal to no improvement despite conservative measures - CCS following, plan for laparotomy on 7/24   ESRD (end stage renal disease) -per Renal -HD TTS  HTN -BP very fluctuant -continue Amlodipine  Chronic Gout -prednisone-chronic  Thrombocytopenia -Chronic-but almost back to normal-monitor CBC periodically -likely from Hep C -monitor platelet count while on prophylactic heparin-decreasing- stopped heparin 7/18  Disposition: -remain inpatient  DVT Prophylaxis: -stop prophylactic heparin 7/18,platelet-now stable-placed on SCD's  Code Status: -Full Code  Brief Narrative -Patient is a 51 year old male with a past medical history of end-stage renal disease-on dialysis, failed renal transplant,  on chronic prednisone therapy for presumably chronic gout, history of prostatectomy who presented to the hospital on 04/02/2012 with abdominal pain along with nausea and vomiting. He was found to have a bowel obstruction and admitted to the hospitalist service. Over the course of his admission, he has had slow but minimal improvement, however has not had a bowel movement yet. Over the past few days he has had no bowel movement or flatus, CCS has been following, however because of lack of consistent improvement he is planned for laparotomy on 24th of July 2013. His medical issues are stable, his blood pressure is very fluctuant but  relatively well controlled with amlodipine. He did have thrombocytopenia-however after stopping heparin this is almost resolved. Nephrology has been following him for dialysis issues.  Intake/Output from previous day:  Intake/Output Summary (Last 24 hours) at 04/13/12 1424 Last data filed at 04/13/12 1043  Gross per 24 hour  Intake    120 ml  Output    756 ml  Net   -636 ml    PHYSICAL EXAM: Weight change: 0.372 kg (13.1 oz) Body mass index is 14.57 kg/(m^2).  Gen Exam: Awake and alert with clear speech.  Neck: Supple, No JVD.   Chest: B/L Clear.   CVS: S1 S2 Regular, no murmurs.  Abdomen: soft, BS +, non tender, non distended.  Extremities: no edema, lower extremities warm to touch. Neurologic: Non Focal.   Skin: No Rash.   Wounds: N/A.    CONSULTS:  nephrology and general surgery  LAB RESULTS: CBC  Lab 04/13/12 0650 04/12/12 0620 04/10/12 0659 04/08/12 0639  WBC 8.0 7.9 7.2 8.9  HGB 15.1 15.4 15.1 16.0  HCT 44.0 45.7 43.9 47.1  PLT 151 123* 139* 114*  MCV 84.1 86.2 83.3 85.0  MCH 28.9 29.1 28.7 28.9  MCHC 34.3 33.7 34.4 34.0  RDW 14.0 14.4 14.1 14.6  LYMPHSABS -- -- -- --  MONOABS -- -- -- --  EOSABS -- -- -- --  BASOSABS -- -- -- --  BANDABS -- -- -- --    Chemistries   Lab 04/13/12 0650 04/12/12 0620 04/10/12 0659 04/08/12 0639  NA 137 136 140 141  K 4.9 4.8 4.9 5.1  CL  87* 90* 94* 91*  CO2 24 22 20 23   GLUCOSE 79 70 76 78  BUN 79* 60* 84* 91*  CREATININE 14.26* 11.38* 11.92* 13.53*  CALCIUM 9.6 9.8 9.8 10.3  MG -- -- -- --    CBG: No results found for this basename: GLUCAP:5 in the last 168 hours  GFR Estimated Creatinine Clearance: 4.1 ml/min (by C-G formula based on Cr of 14.26).  Coagulation profile No results found for this basename: INR:5,PROTIME:5 in the last 168 hours  Cardiac Enzymes No results found for this basename: CK:3,CKMB:3,TROPONINI:3,MYOGLOBIN:3 in the last 168 hours  No components found with this basename: POCBNP:3 No  results found for this basename: DDIMER:2 in the last 72 hours No results found for this basename: HGBA1C:2 in the last 72 hours No results found for this basename: CHOL:2,HDL:2,LDLCALC:2,TRIG:2,CHOLHDL:2,LDLDIRECT:2 in the last 72 hours No results found for this basename: TSH,T4TOTAL,FREET3,T3FREE,THYROIDAB in the last 72 hours No results found for this basename: VITAMINB12:2,FOLATE:2,FERRITIN:2,TIBC:2,IRON:2,RETICCTPCT:2 in the last 72 hours No results found for this basename: LIPASE:2,AMYLASE:2 in the last 72 hours  Urine Studies No results found for this basename: UACOL:2,UAPR:2,USPG:2,UPH:2,UTP:2,UGL:2,UKET:2,UBIL:2,UHGB:2,UNIT:2,UROB:2,ULEU:2,UEPI:2,UWBC:2,URBC:2,UBAC:2,CAST:2,CRYS:2,UCOM:2,BILUA:2 in the last 72 hours  MICROBIOLOGY: No results found for this or any previous visit (from the past 240 hour(s)).  RADIOLOGY STUDIES/RESULTS: Ct Abdomen Pelvis Wo Contrast  04/03/2012  *RADIOLOGY REPORT*  Clinical Data: Abdominal pain  CT ABDOMEN AND PELVIS WITHOUT CONTRAST  Technique:  Multidetector CT imaging of the abdomen and pelvis was performed following the standard protocol without intravenous contrast.  Comparison: 11/01/2004  Findings: Coronary artery calcification.  Linear opacity at the left lung base.  Organ abnormality/lesion detection is limited in the absence of intravenous contrast. Within this limitation, unremarkable liver, biliary system, spleen, adrenal glands. There is dilatation of the main pancreatic duct, measuring up to 5 mm.  Atrophic native kidneys with bilateral cystic lesions of varying complexity.  Transplant kidney left lower quadrant with sinus lipomatosis and cortical thinning.  There is a loop of small bowel distended up to 3.3 cm and demonstrating air fluid level.  Distal small bowel is decompressed.  Colonic diverticulosis.  No CT evidence for diverticulitis  No free intraperitoneal air or fluid.  No lymphadenopathy.  Advanced atherosclerotic disease of the aorta  and branch vessels.  Decompressed bladder.  Surgical clips in the pelvis.  Partially imaged left femoral bypass.  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 Unchanged cystic and sclerotic changes of the right femoral neck.  No acute osseous finding.  IMPRESSION: Dilated small bowel loops up to 3.3 cm with air-fluid levels. Decompressed bowel loops distally.  This may reflect an early small bowel obstruction.  Recommend correlation with symptoms and if no intervention, follow-up KUB to document passage of contrast into the colon.  Advanced atherosclerotic disease.  Transplant kidney left lower quadrant with prominent renal sinus fat, similar to prior.  Bilateral native kidneys with incompletely characterized cystic lesions of varying complexity.  Consider follow-up renal MRI.  Dilated main pancreatic duct up to 5 mm.  No obstructing lesion visualized by CT.  This can also be better characterized at the time of MR.  Original Report Authenticated By: Waneta Martins, M.D.   Abd 1 View (kub)  04/05/2012  *RADIOLOGY REPORT*  Clinical Data: Follow-up small bowel obstruction.  ABDOMEN - 1 VIEW  Comparison: 04/03/2012  Findings: Dilated lower abdominal small bowel loops are again noted compatible with small bowel obstruction.  Likely no significant change since prior CT.  No free air.  Surgical clips in the  pelvis. No acute bony abnormality.  IMPRESSION: Stable small bowel dilatation in the lower abdomen, likely no significant change.  Original Report Authenticated By: Cyndie Chime, M.D.   Dg Abd 2 Views  04/06/2012  *RADIOLOGY REPORT*  Clinical Data: Small bowel obstruction.  Abdominal pain.  ABDOMEN - 2 VIEW  Comparison: Radiographs dated 04/05/2012 and CT scan dated 04/03/2012  Findings: There is persistent dilatation of multiple small bowel loops.  The contrast given for the prior CT scan remains in these dilated small bowel loops.  No contrast in the nondistended colon.  No free air in the upright  radiograph.  IMPRESSION: Persistent small bowel obstruction, unchanged.  Original Report Authenticated By: Gwynn Burly, M.D.    MEDICATIONS: Scheduled Meds:    . amLODipine  5 mg Oral Daily  . diphenhydrAMINE  12.5 mg Intravenous Once  . ertapenem  1 g Intravenous 60 min Pre-Op  . lanthanum  2,000 mg Oral TID WC  . morphine      . ondansetron      . polyethylene glycol  17 g Oral BID  . predniSONE  5 mg Oral Q breakfast   Continuous Infusions:  PRN Meds:.acetaminophen, acetaminophen, hydrALAZINE, morphine injection, ondansetron (ZOFRAN) IV, ondansetron, phenol  Antibiotics: Anti-infectives     Start     Dose/Rate Route Frequency Ordered Stop   04/13/12 1359   ertapenem (INVANZ) 1 g in sodium chloride 0.9 % 50 mL IVPB        1 g 100 mL/hr over 30 Minutes Intravenous 60 min pre-op 04/13/12 1359           Jeoffrey Massed, MD  [facility] Pager [phone]  If 7PM-7AM, please contact night-coverage [email or web address] Password [email or web address] 04/13/2012, 2:24 PM   LOS: 11 days

## 2012-04-13 NOTE — Progress Notes (Signed)
Nutrition Follow-up  Intervention:   1. Does not want any nutrition interventions at this time.  2. RD will continue to follow   Assessment:   Pt diet was advanced slowly, was tolerating clears but had episodes of emesis when advanced to full liquids. Had soap suds enema x2 to help clear obstruction. No BM or flatus still, surgery plans for Ex Lap on Wednesday.  Continues on HD.  Pt just back from HD at time of RD visit, tired and did not want to talk. Poor appetite. Does not like Nepro, does not want snacks.    Diet Order:  Full Liquids Supplements: none PO intake: Variable   Meds: Scheduled Meds:   . amLODipine  5 mg Oral Daily  . diphenhydrAMINE  12.5 mg Intravenous Once  . lanthanum  2,000 mg Oral TID WC  . morphine      . ondansetron      . polyethylene glycol  17 g Oral BID  . predniSONE  5 mg Oral Q breakfast   Continuous Infusions:  PRN Meds:.acetaminophen, acetaminophen, hydrALAZINE, morphine injection, ondansetron (ZOFRAN) IV, ondansetron, phenol, DISCONTD: heparin, DISCONTD: heparin  Labs:  CMP     Component Value Date/Time   NA 137 04/13/2012 0650   K 4.9 04/13/2012 0650   CL 87* 04/13/2012 0650   CO2 24 04/13/2012 0650   GLUCOSE 79 04/13/2012 0650   BUN 79* 04/13/2012 0650   CREATININE 14.26* 04/13/2012 0650   CALCIUM 9.6 04/13/2012 0650   PROT 8.0 04/02/2012 1918   ALBUMIN 3.4* 04/13/2012 0650   AST 35 04/02/2012 1918   ALT 23 04/02/2012 1918   ALKPHOS 59 04/02/2012 1918   BILITOT 0.5 04/02/2012 1918   GFRNONAA 3* 04/13/2012 0650   GFRAA 4* 04/13/2012 0650   Phosphorus  Date/Time Value Range Status  04/13/2012  6:50 AM 11.1* 2.3 - 4.6 mg/dL Final  1/47/8295  6:21 AM 12.0* 2.3 - 4.6 mg/dL Final  11/27/6576  4:69 AM 12.3* 2.3 - 4.6 mg/dL Final  03/21/5283  1:32 PM 7.2* 2.3 - 4.6 mg/dL Final  4/40/1027  2:53 PM 6.0* 2.3 - 4.6 mg/dL Final      Intake/Output Summary (Last 24 hours) at 04/13/12 1015 Last data filed at 04/12/12 1530  Gross per 24 hour  Intake    300  ml  Output      0 ml  Net    300 ml    Weight Status:  106 lbs, trending down from admission weight   Re-estimated needs:  2000-2200 kcal, 70-80 gm protein  Nutrition Dx:  Inadequate oral intake now r/t poor appetite with pain AEB variable intake with liquid diet   Goal:  Diet advance as medically able, unmet  Monitor:  PO intake, surgery, diet advance, weight    [name] RD, LDN Pager [phone] After Hours pager [phone]

## 2012-04-13 NOTE — Procedures (Signed)
On hemodialysis without instability. C/O abdominal discomfort this morning. Using left groin AV Gortex graft without difficulties. Has obvious weight loss. Plans per primary team and surgery.  Phosphorous is over 11mg /dl on Renvela in hospital. [name] C

## 2012-04-14 ENCOUNTER — Inpatient Hospital Stay (HOSPITAL_COMMUNITY): Payer: Medicare Other | Admitting: Anesthesiology

## 2012-04-14 ENCOUNTER — Encounter (HOSPITAL_COMMUNITY)
Admission: EM | Disposition: A | Discharge status: Skilled Nursing Facility | Payer: Self-pay | Source: Home / Self Care | Attending: Internal Medicine

## 2012-04-14 ENCOUNTER — Encounter (HOSPITAL_COMMUNITY): Payer: Self-pay | Admitting: Anesthesiology

## 2012-04-14 DIAGNOSIS — K565 Intestinal adhesions [bands], unspecified as to partial versus complete obstruction: Secondary | ICD-10-CM

## 2012-04-14 DIAGNOSIS — M109 Gout, unspecified: Secondary | ICD-10-CM | POA: Diagnosis present

## 2012-04-14 DIAGNOSIS — D696 Thrombocytopenia, unspecified: Secondary | ICD-10-CM | POA: Diagnosis present

## 2012-04-14 HISTORY — PX: LAPAROTOMY: SHX154

## 2012-04-14 HISTORY — PX: COLON RESECTION: SHX5231

## 2012-04-14 LAB — CBC
HCT: 42.8 % (ref 39.0–52.0)
MCV: 87 fL (ref 78.0–100.0)
RBC: 4.92 MIL/uL (ref 4.22–5.81)
WBC: 12.8 10*3/uL — ABNORMAL HIGH (ref 4.0–10.5)

## 2012-04-14 LAB — BASIC METABOLIC PANEL
CO2: 22 mEq/L (ref 19–32)
Chloride: 96 mEq/L (ref 96–112)
Creatinine, Ser: 10.28 mg/dL — ABNORMAL HIGH (ref 0.50–1.35)
Glucose, Bld: 83 mg/dL (ref 70–99)
Sodium: 140 mEq/L (ref 135–145)

## 2012-04-14 SURGERY — LAPAROTOMY, EXPLORATORY
Anesthesia: General | Site: Abdomen | Wound class: Clean Contaminated

## 2012-04-14 MED ORDER — ONDANSETRON HCL 4 MG/2ML IJ SOLN
INTRAMUSCULAR | Status: DC | PRN
  Administered 2012-04-14: 4 mg via INTRAVENOUS

## 2012-04-14 MED ORDER — FENTANYL CITRATE 0.05 MG/ML IJ SOLN
INTRAMUSCULAR | Status: DC | PRN
  Administered 2012-04-14: 50 ug via INTRAVENOUS
  Administered 2012-04-14: 150 ug via INTRAVENOUS
  Administered 2012-04-14: 50 ug via INTRAVENOUS

## 2012-04-14 MED ORDER — AMLODIPINE BESYLATE 5 MG PO TABS
5.0000 mg | ORAL_TABLET | Freq: Every day | ORAL | Status: DC
  Filled 2012-04-14: qty 1

## 2012-04-14 MED ORDER — MORPHINE SULFATE (PF) 1 MG/ML IV SOLN
INTRAVENOUS | Status: AC
  Filled 2012-04-14: qty 25

## 2012-04-14 MED ORDER — SODIUM CHLORIDE 0.9 % IV SOLN
INTRAVENOUS | Status: DC | PRN
  Administered 2012-04-14 (×3): via INTRAVENOUS

## 2012-04-14 MED ORDER — DEXTROSE-NACL 5-0.9 % IV SOLN: INTRAVENOUS | Status: DC

## 2012-04-14 MED ORDER — ONDANSETRON HCL 4 MG/2ML IJ SOLN: 4.0000 mg | Freq: Four times a day (QID) | INTRAMUSCULAR | Status: DC | PRN

## 2012-04-14 MED ORDER — LIDOCAINE HCL (CARDIAC) 20 MG/ML IV SOLN
INTRAVENOUS | Status: DC | PRN
  Administered 2012-04-14: 80 mg via INTRAVENOUS

## 2012-04-14 MED ORDER — SUCCINYLCHOLINE CHLORIDE 20 MG/ML IJ SOLN
INTRAMUSCULAR | Status: DC | PRN
  Administered 2012-04-14: 100 mg via INTRAVENOUS

## 2012-04-14 MED ORDER — HETASTARCH-ELECTROLYTES 6 % IV SOLN
INTRAVENOUS | Status: DC | PRN
  Administered 2012-04-14: 09:00:00 via INTRAVENOUS

## 2012-04-14 MED ORDER — HYDROMORPHONE HCL PF 1 MG/ML IJ SOLN
0.2500 mg | INTRAMUSCULAR | Status: DC | PRN
  Administered 2012-04-14 (×4): 0.5 mg via INTRAVENOUS

## 2012-04-14 MED ORDER — PROPOFOL 10 MG/ML IV EMUL
INTRAVENOUS | Status: DC | PRN
  Administered 2012-04-14: 120 mg via INTRAVENOUS

## 2012-04-14 MED ORDER — HYDROMORPHONE HCL PF 1 MG/ML IJ SOLN
INTRAMUSCULAR | Status: AC
  Filled 2012-04-14: qty 1

## 2012-04-14 MED ORDER — GLYCOPYRROLATE 0.2 MG/ML IJ SOLN
INTRAMUSCULAR | Status: DC | PRN
  Administered 2012-04-14: .7 mg via INTRAVENOUS

## 2012-04-14 MED ORDER — ALBUMIN HUMAN 5 % IV SOLN
INTRAVENOUS | Status: DC | PRN
  Administered 2012-04-14: 09:00:00 via INTRAVENOUS

## 2012-04-14 MED ORDER — PHENYLEPHRINE HCL 10 MG/ML IJ SOLN
INTRAMUSCULAR | Status: DC | PRN
  Administered 2012-04-14 (×2): 20 ug via INTRAVENOUS
  Administered 2012-04-14: 40 ug via INTRAVENOUS
  Administered 2012-04-14: 80 ug via INTRAVENOUS
  Administered 2012-04-14 (×3): 40 ug via INTRAVENOUS

## 2012-04-14 MED ORDER — DIPHENHYDRAMINE HCL 50 MG/ML IJ SOLN: 12.5000 mg | Freq: Four times a day (QID) | INTRAMUSCULAR | Status: DC | PRN

## 2012-04-14 MED ORDER — SODIUM CHLORIDE 0.9 % IJ SOLN: 9.0000 mL | INTRAMUSCULAR | Status: DC | PRN

## 2012-04-14 MED ORDER — DROPERIDOL 2.5 MG/ML IJ SOLN: 0.6250 mg | INTRAMUSCULAR | Status: DC | PRN

## 2012-04-14 MED ORDER — SODIUM CHLORIDE 0.9 % IV SOLN
INTRAVENOUS | Status: DC
  Administered 2012-04-14: 10 mL/h via INTRAVENOUS
  Administered 2012-04-21: 250 mL via INTRAVENOUS
  Administered 2012-04-21: 19:00:00 via INTRAVENOUS

## 2012-04-14 MED ORDER — NALOXONE HCL 0.4 MG/ML IJ SOLN: 0.4000 mg | INTRAMUSCULAR | Status: DC | PRN

## 2012-04-14 MED ORDER — NEOSTIGMINE METHYLSULFATE 1 MG/ML IJ SOLN
INTRAMUSCULAR | Status: DC | PRN
  Administered 2012-04-14: 3.5 mg via INTRAVENOUS

## 2012-04-14 MED ORDER — MORPHINE SULFATE (PF) 1 MG/ML IV SOLN
INTRAVENOUS | Status: DC
  Administered 2012-04-14: 14:00:00 via INTRAVENOUS
  Administered 2012-04-14: 16.28 mg via INTRAVENOUS
  Administered 2012-04-15: 12 mg via INTRAVENOUS
  Administered 2012-04-15: 4.5 mg via INTRAVENOUS
  Administered 2012-04-15: 3 mg via INTRAVENOUS
  Filled 2012-04-14 (×2): qty 25

## 2012-04-14 MED ORDER — DIPHENHYDRAMINE HCL 12.5 MG/5ML PO ELIX
12.5000 mg | ORAL_SOLUTION | Freq: Four times a day (QID) | ORAL | Status: DC | PRN
  Filled 2012-04-14: qty 5

## 2012-04-14 MED ORDER — 0.9 % SODIUM CHLORIDE (POUR BTL) OPTIME
TOPICAL | Status: DC | PRN
  Administered 2012-04-14 (×2): 1000 mL
  Administered 2012-04-14 (×2): 2000 mL

## 2012-04-14 MED ORDER — VECURONIUM BROMIDE 10 MG IV SOLR
INTRAVENOUS | Status: DC | PRN
  Administered 2012-04-14 (×2): 1 mg via INTRAVENOUS
  Administered 2012-04-14: 2 mg via INTRAVENOUS
  Administered 2012-04-14: 5 mg via INTRAVENOUS
  Administered 2012-04-14: 1 mg via INTRAVENOUS

## 2012-04-14 MED ORDER — SODIUM CHLORIDE 0.9 % IV SOLN
1.0000 g | INTRAVENOUS | Status: AC
  Administered 2012-04-14: 1 g via INTRAVENOUS
  Filled 2012-04-14: qty 1

## 2012-04-14 SURGICAL SUPPLY — 54 items
BANDAGE GAUZE ELAST BULKY 4 IN (GAUZE/BANDAGES/DRESSINGS) ×1 IMPLANT
BLADE SURG ROTATE 9660 (MISCELLANEOUS) ×1 IMPLANT
CANISTER SUCTION 2500CC (MISCELLANEOUS) ×2 IMPLANT
CATH ROBINSON RED A/P 14FR (CATHETERS) ×1 IMPLANT
CHLORAPREP W/TINT 26ML (MISCELLANEOUS) ×2 IMPLANT
CLOTH BEACON ORANGE TIMEOUT ST (SAFETY) ×2 IMPLANT
COVER SURGICAL LIGHT HANDLE (MISCELLANEOUS) ×2 IMPLANT
DRAPE LAPAROSCOPIC ABDOMINAL (DRAPES) ×2 IMPLANT
DRAPE PROXIMA HALF (DRAPES) ×1 IMPLANT
DRAPE UTILITY 15X26 W/TAPE STR (DRAPE) ×4 IMPLANT
DRAPE WARM FLUID 44X44 (DRAPE) ×2 IMPLANT
ELECT CAUTERY BLADE 6.4 (BLADE) ×1 IMPLANT
ELECT REM PT RETURN 9FT ADLT (ELECTROSURGICAL) ×2
ELECTRODE REM PT RTRN 9FT ADLT (ELECTROSURGICAL) ×1 IMPLANT
GEL ULTRASOUND 20GR AQUASONIC (MISCELLANEOUS) ×1 IMPLANT
GLOVE BIO SURGEON STRL SZ7 (GLOVE) ×2 IMPLANT
GLOVE BIO SURGEON STRL SZ8 (GLOVE) ×4 IMPLANT
GLOVE BIOGEL PI IND STRL 6.5 (GLOVE) IMPLANT
GLOVE BIOGEL PI IND STRL 7.0 (GLOVE) IMPLANT
GLOVE BIOGEL PI IND STRL 8 (GLOVE) ×1 IMPLANT
GLOVE BIOGEL PI INDICATOR 6.5 (GLOVE) ×1
GLOVE BIOGEL PI INDICATOR 7.0 (GLOVE) ×1
GLOVE BIOGEL PI INDICATOR 8 (GLOVE) ×2
GLOVE ECLIPSE 6.0 STRL STRAW (GLOVE) ×2 IMPLANT
GLOVE SURG SS PI 7.0 STRL IVOR (GLOVE) ×2 IMPLANT
GOWN STRL NON-REIN LRG LVL3 (GOWN DISPOSABLE) ×5 IMPLANT
KIT BASIN OR (CUSTOM PROCEDURE TRAY) ×2 IMPLANT
KIT ROOM TURNOVER OR (KITS) ×2 IMPLANT
LIGASURE IMPACT 36 18CM CVD LR (INSTRUMENTS) ×1 IMPLANT
MARKER SKIN DUAL TIP RULER LAB (MISCELLANEOUS) ×1 IMPLANT
NS IRRIG 1000ML POUR BTL (IV SOLUTION) ×7 IMPLANT
PACK GENERAL/GYN (CUSTOM PROCEDURE TRAY) ×2 IMPLANT
PAD ARMBOARD 7.5X6 YLW CONV (MISCELLANEOUS) ×5 IMPLANT
RELOAD PROXIMATE 75MM BLUE (ENDOMECHANICALS) ×12 IMPLANT
RELOAD PROXIMATE TA60MM BLUE (ENDOMECHANICALS) ×2 IMPLANT
RELOAD STAPLE 60 BLU REG PROX (ENDOMECHANICALS) IMPLANT
RELOAD STAPLE 75 3.8 BLU REG (ENDOMECHANICALS) IMPLANT
SPECIMEN JAR LARGE (MISCELLANEOUS) IMPLANT
SPONGE GAUZE 4X4 12PLY (GAUZE/BANDAGES/DRESSINGS) ×2 IMPLANT
SPONGE LAP 18X18 X RAY DECT (DISPOSABLE) ×2 IMPLANT
STAPLER GUN LINEAR PROX 60 (STAPLE) ×1 IMPLANT
STAPLER PROXIMATE 75MM BLUE (STAPLE) ×1 IMPLANT
STAPLER VISISTAT 35W (STAPLE) ×2 IMPLANT
SUCTION POOLE TIP (SUCTIONS) IMPLANT
SUT ETHILON 2 0 FS 18 (SUTURE) ×1 IMPLANT
SUT PDS AB 1 CTX 36 (SUTURE) ×2 IMPLANT
SUT VIC AB 3-0 SH 18 (SUTURE) ×8 IMPLANT
SUT VICRYL AB 3 0 TIES (SUTURE) ×2 IMPLANT
TAPE CLOTH SURG 4X10 WHT LF (GAUZE/BANDAGES/DRESSINGS) ×1 IMPLANT
TOWEL OR 17X24 6PK STRL BLUE (TOWEL DISPOSABLE) ×2 IMPLANT
TOWEL OR 17X26 10 PK STRL BLUE (TOWEL DISPOSABLE) ×2 IMPLANT
TRAY FOLEY CATH 14FRSI W/METER (CATHETERS) IMPLANT
WATER STERILE IRR 1000ML POUR (IV SOLUTION) ×1 IMPLANT
YANKAUER SUCT BULB TIP NO VENT (SUCTIONS) ×1 IMPLANT

## 2012-04-14 NOTE — Interval H&P Note (Signed)
History and Physical Interval Note:  04/14/2012 8:20 AM  [name]  has presented today for surgery, with the diagnosis of bowel obstruction  The various methods of treatment have been discussed with the patient and family. After consideration of risks, benefits and other options for treatment, the patient has consented to  Procedure(s) (LRB): EXPLORATORY LAPAROTOMY (N/A) as a surgical intervention .  The patient's history has been reviewed, patient examined, no change in status, stable for surgery.  I have reviewed the patient's chart and labs.  Questions were answered to the patient's satisfaction.     [name] A.

## 2012-04-14 NOTE — Progress Notes (Signed)
Subjective: Patient seen after surgery. He is resting comfortable in bed. He relates mild pain abdomen.  No Dyspnea.  Objective: Filed Vitals:   04/14/12 1515 04/14/12 1530 04/14/12 1545 04/14/12 1600  BP: 164/86 162/80 152/80 157/74  Pulse:  88 93   Temp: 97.1 F (36.2 C)     TempSrc: Oral     Resp: 10 12 11 15   Height:      Weight:      SpO2: 100% 100% 100%    Weight change: -0.772 kg (-1 lb 11.2 oz)   General: Alert, awake, oriented x3, in no acute distress.  HEENT: No bruits, no goiter.  Heart: Regular rate and rhythm, without murmurs, rubs, gallops.  Lungs: Crackles left side, bilateral air movement.  Abdomen: Soft, clean dressing, nondistended, positive bowel sounds.  Neuro: Grossly intact, nonfocal.   Lab Results:  [facility] 04/14/12 1445 04-29-2012 0650  NA 140 137  K 5.4* 4.9  CL 96 87*  CO2 22 24  GLUCOSE 83 79  BUN 50* 79*  CREATININE 10.28* 14.26*  CALCIUM 8.6 9.6  MG -- --  PHOS -- 11.1*    Basename 2012-04-29 0650  AST --  ALT --  ALKPHOS --  BILITOT --  PROT --  ALBUMIN 3.4*    Basename 04/14/12 1445 04-29-12 0650  WBC 12.8* 8.0  NEUTROABS -- --  HGB 14.0 15.1  HCT 42.8 44.0  MCV 87.0 84.1  PLT 133* 151    Micro Results: No results found for this or any previous visit (from the past 240 hour(s)).  Studies/Results: Dg Abd 2 Views  04-29-12  *RADIOLOGY REPORT*  Clinical Data: Abdominal pain, weakness.  ABDOMEN - 2 VIEW  Comparison: Abdominal radiograph 04/12/2012.  Findings: There is some gas, stool and residual oral contrast material noted within the colon.  There is a paucity of distal colonic and rectal gas. Visualized portions of the colon do not appear dilated.  Several dilated loops of small bowel are seen in the upper abdomen, measuring up to 4.2 cm in diameter.  No gross evidence of pneumoperitoneum is noted on this limited supine view of the abdomen.  Multiple surgical clips are seen throughout the pelvis.  Numerous vascular  calcifications are noted.  IMPRESSION: 1.  The bowel gas pattern is again most consistent with a partial small-bowel obstruction, as above.  The appearance is very similar to the recent prior examination.  Original Report Authenticated By: Florencia Reasons, M.D.    Medications: I have reviewed the patient's current medications.  1-SBO (small bowel obstruction)  - Minimal to no improvement despite conservative measures. Patient s/p Exploratory laparotomy with lysis of adhesions and small bowel resection x2 (7-24).  Post surgery stable.   ESRD (end stage renal disease)  -per Renal  -HD TTS  -electrolytes correction by renal.   HTN  -BP very fluctuant  -continue Amlodipine , PRN Hydralazine.   Chronic Gout  -prednisone-chronic, if patient is not started on diet will need iv steroids.   Thrombocytopenia  -Chronic-but almost back to normal-monitor CBC periodically  -likely from Hep C  -prophylactic heparin- stopped  7/18   DVT Prophylaxis:  -stop prophylactic heparin 7/18,platelet-now stable-placed on SCD's   Code Status:  -Full Code     LOS: 12 days   [name] M.D.  [facility] 04/14/2012, 4:25 PM

## 2012-04-14 NOTE — Anesthesia Procedure Notes (Signed)
Procedure Name: Intubation Date/Time: 04/14/2012 8:40 AM Performed by: Carmela Rima Pre-anesthesia Checklist: Patient identified, Timeout performed, Suction available, Emergency Drugs available and Patient being monitored Patient Re-evaluated:Patient Re-evaluated prior to inductionOxygen Delivery Method: Circle system utilized Preoxygenation: Pre-oxygenation with 100% oxygen Intubation Type: IV induction, Rapid sequence and Cricoid Pressure applied Laryngoscope Size: Mac and 4 Grade View: Grade I Tube type: Oral Tube size: 7.5 mm Number of attempts: 1 Placement Confirmation: ETT inserted through vocal cords under direct vision,  breath sounds checked- equal and bilateral and positive ETCO2 Secured at: 23 cm Tube secured with: Tape Dental Injury: Teeth and Oropharynx as per pre-operative assessment

## 2012-04-14 NOTE — Progress Notes (Signed)
Report given to jamie rn as caregiver 

## 2012-04-14 NOTE — Anesthesia Preprocedure Evaluation (Addendum)
Anesthesia Evaluation  Patient identified by MRN, date of birth, ID band Patient awake    Reviewed: Allergy & Precautions, H&P , NPO status , Patient's Chart, lab work & pertinent test results  History of Anesthesia Complications Negative for: history of anesthetic complications  Airway Mallampati: I TM Distance: >3 FB Neck ROM: full    Dental  (+) Dental Advidsory Given, Teeth Intact and Caps   Pulmonary neg pulmonary ROS,    Pulmonary exam normal       Cardiovascular negative cardio ROS  Rhythm:Regular Rate:Normal     Neuro/Psych    GI/Hepatic negative GI ROS, Neg liver ROS,   Endo/Other    Renal/GU Dialysis and CRFRenal disease     Musculoskeletal   Abdominal   Peds  Hematology   Anesthesia Other Findings   Reproductive/Obstetrics                        Anesthesia Physical Anesthesia Plan  ASA: III  Anesthesia Plan: General ETT   Post-op Pain Management:    Induction: Intravenous and Rapid sequence  Airway Management Planned: Oral ETT  Additional Equipment:   Intra-op Plan:   Post-operative Plan: Possible Post-op intubation/ventilation  Informed Consent: I have reviewed the patients History and Physical, chart, labs and discussed the procedure including the risks, benefits and alternatives for the proposed anesthesia with the patient or authorized representative who has indicated his/her understanding and acceptance.   Dental Advisory Given  Plan Discussed with: CRNA, Surgeon and Anesthesiologist  Anesthesia Plan Comments:        Anesthesia Quick Evaluation

## 2012-04-14 NOTE — Progress Notes (Signed)
Patient ID: [name], male   DOB: 08/31/1961, 51 y.o.   MRN: [id number]  [name] is a patient who has been followed in the past by Dr. Annabell Howells. I was asked by Dr. Annabell Howells to assist with catheter placement after he received a call by the PACU staff today that they were having a difficult time with catheterization.  He underwent an exploratory laparotomy and lysis of adhesions for a small bowel obstruction today by Dr. Luisa Hart and apparently was noted to have a distended bladder intraoperatively. He was apparently in and out catheterized in the OR according the operative note.  [name] has long standing ESRD and makes "less than a cup" of urine per week.  He is not currently uncomfortable.  I do not see an indication for urethral catheterization acutely or how it will help perioperative management. I have notified Dr. Lindie Spruce that I have recommended not placing a catheter at this time.  Please call Dr. Annabell Howells if further questions.

## 2012-04-14 NOTE — Preoperative (Signed)
Beta Blockers   Reason not to administer Beta Blockers:Not Applicable 

## 2012-04-14 NOTE — Anesthesia Postprocedure Evaluation (Signed)
Anesthesia Post Note  Patient: [name]  Procedure(s) Performed: Procedure(s) (LRB): EXPLORATORY LAPAROTOMY (N/A) LYSIS OF ADHESION (N/A) COLON RESECTION (N/A)  Anesthesia type: General  Patient location: PACU  Post pain: Pain level controlled and Adequate analgesia  Post assessment: Post-op Vital signs reviewed, Patient's Cardiovascular Status Stable, Respiratory Function Stable, Patent Airway and Pain level controlled  Last Vitals:  Filed Vitals:   04/14/12 1424  BP: 161/71  Pulse: 89  Temp:   Resp: 11    Post vital signs: Reviewed and stable  Level of consciousness: awake, alert  and oriented  Complications: No apparent anesthesia complications

## 2012-04-14 NOTE — H&P (View-Only) (Signed)
  Subjective: No nausea or vomiting.  No BM or flatus. Not any better.  Objective: Vital signs in last 24 hours: Temp:  [97.1 F (36.2 C)-98.5 F (36.9 C)] 97.1 F (36.2 C) (07/23 0641) Pulse Rate:  [75-89] 81  (07/23 0900) Resp:  [17-20] 20  (07/23 0900) BP: (136-174)/(75-93) 157/85 mmHg (07/23 0900) SpO2:  [93 %-99 %] 93 % (07/23 0551) Weight:  [106 lb 3.2 oz (48.172 kg)-106 lb 4.2 oz (48.2 kg)] 106 lb 4.2 oz (48.2 kg) (07/23 0641) Last BM Date: 04/12/12 (spoke with patient/reports bm on or close to admission date)  Intake/Output from previous day: 07/22 0701 - 07/23 0700 In: 420 [P.O.:420] Out: -  Intake/Output this shift:    GI: min tenderness slightly more distended.  no rebound or guarding.   Lab Results:   Basename 04/13/12 0650 04/12/12 0620  WBC 8.0 7.9  HGB 15.1 15.4  HCT 44.0 45.7  PLT 151 123*   BMET  Basename 04/13/12 0650 04/12/12 0620  NA 137 136  K 4.9 4.8  CL 87* 90*  CO2 24 22  GLUCOSE 79 70  BUN 79* 60*  CREATININE 14.26* 11.38*  CALCIUM 9.6 9.8   PT/INR No results found for this basename: LABPROT:2,INR:2 in the last 72 hours ABG No results found for this basename: PHART:2,PCO2:2,PO2:2,HCO3:2 in the last 72 hours  Studies/Results: Dg Abd 1 View  04/12/2012  *RADIOLOGY REPORT*  Clinical Data: Abdominal pain, constipation, nausea and vomiting  ABDOMEN - 1 VIEW  Comparison: 04/11/2012; 04/08/2012; 04/06/2012; chest CT of 04/03/2012  Findings:  No change to minimal decrease in gaseous distension of several loops of small bowel with index loop within the right mid hemiabdomen measuring approximately 3.9 cm in diameter.  Minimal transit of previously ingested oral contrast with enteric contrast now seen within the splenic flexure of the colon. Evaluation pneumoperitoneum is limited secondary to supine patient positioning.  No definite pneumatosis or portal venous gas.  Vascular calcifications overlie the left upper abdominal quadrant. Multiple  surgical clips overlie the pelvis.  Grossly unchanged bones.  IMPRESSION: No change to minimal decrease in findings compatible with partial small bowel obstruction.  Original Report Authenticated By: Waynard Reeds, M.D.   Dg Abd 1 View  04/11/2012  *RADIOLOGY REPORT*  Clinical Data:  small bowel obstruction  ABDOMEN - 1 VIEW  Comparison: 04/08/2012  Findings: Persistent small bowel dilatation in the left mid abdomen measuring 4.6 cm in diameter.  Retained contrast and stool in the the right colon.  Minimal improvement compared to 04/08/2012. Postop changes of the pelvis.  Vascular calcifications present.  IMPRESSION: Some improvement in partial small bowel obstruction pattern.  Original Report Authenticated By: Judie Petit. Ruel Favors, M.D.    Anti-infectives: Anti-infectives    None      Assessment/Plan: Persistent partial SBO with no improvement over last 24  hour  Ex lap on wed.  High operative risk.  Risk of bleeding,  Infection,  Death,  Wound problems,  Fistula ,  Organ injury,  Worsening of medical problems and further surgery.  Non operative options discussed.  Risks understood and he would like to proceed.  LOS: 11 days    Lional Icenogle A. 04/13/2012

## 2012-04-14 NOTE — Transfer of Care (Signed)
Immediate Anesthesia Transfer of Care Note  Patient: [name]  Procedure(s) Performed: Procedure(s) (LRB): EXPLORATORY LAPAROTOMY (N/A) LYSIS OF ADHESION (N/A) COLON RESECTION (N/A)  Patient Location: PACU  Anesthesia Type: General  Level of Consciousness: sedated  Airway & Oxygen Therapy: Patient Spontanous Breathing and Patient connected to face mask oxygen  Post-op Assessment: Report given to PACU RN, Post -op Vital signs reviewed and stable and Patient moving all extremities X 4  Post vital signs: Reviewed and stable  Complications: No apparent anesthesia complications

## 2012-04-14 NOTE — Op Note (Signed)
Preoperative diagnosis: Chronic small bowel obstruction  Postoperative diagnosis: Same  Procedure: Exploratory laparotomy with lysis of adhesions and small bowel resection x2  Surgeon: Harriette Bouillon M.D.  Anesthesia: Gen. Endotracheal anesthesia  EBL: 150 cc  Specimen proximal ileum measuring 70 cm resected and 5 cm mid jejunum resected  Drains: None  IV fluids: 2500 cc crystalloid  Indications for procedure: The patient is a 51 year old male with in stage renal disease with a previous abdominal procedures with a partial small bowel obstruction the last 10 days. Last week his condition improved with NG tube decompression over the weekend he took a step back and became more distended once his diet was advanced. He was feeling better 2 days ago but over last 24 hours she's become more distended with more abdominal pain and obstipation. I recommended exploratory laparotomy this point since he has failed medical management. He is a high-risk operative candidate I discussed with him this given his multiple medical problems and his poor nutrition to begin with. I did not feel continued medical management is appropriate at this point. Risk of bleeding, infection, poor wound healing, anastomotic breakdown, death, worsening of medical condition, the need for other procedures were discussed with him. He agreed to proceed.  Description of procedure: The patient was seen in the holding area and questions were answered. He is taken back to the operating room and placed supine. After induction of general anesthesia the abdomen was prepped and draped in a sterile fashion and timeout was done. Midline abdominal incision was used above a previous laparotomy scar beginning just above the umbilicus. I was able to enter the abdominal cavity without difficulty. He had dense anterior abdominal wall adhesions from just below the umbilicus down to the pubic sepsis from previous left colon resection many years ago. 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 The small bowel was tethered in the pelvis and this took some time to release. Once I was able to mobilize the entire small bowel I ran it from the ileocecal valve to the ligament of Treitz. The transition point was in the distal jejunum and another transition point in the mid ileum corresponding to the chronic strictures and adhesions. The bowel at these areas is not helping and I did not feel that was placed in the past the patient. 2 small bowel resections were done the first beginning 30 cm proximal to the ileocecal valve and going proximal from that point for almost 70 cm. Secondary resection was in the mid jejunum and was only 5 cm. 2 side-to-side functional and and anastomoses were created using G. I a 75 stapling devices and a TA 60 the common enterotomy. Both mesenteric defects were closed with interrupted 3-0 Vicryl. There is no tension at either anastomoses and both were patent without evidence of leakage. There is no twisting of the bowel and the small bowel was run again with no other evidence of injury or tear elsewhere.  The colon was found to be normal. Gallbladder was normal. Liver and stomach were grossly normal. He has significant inspissated stool throughout his colon. The rectum was examined and was uninjured. He a largely distended bladder despite being on hemodialysis. He makes intermittent urine he states. At this point time irrigated as abdominal cavity. Fascia was closed with #1 running PDS. Skin was left open with 3 interrupted 2-0 nylon sutures to approximate the skin. The skin was  packed open. In and out catheterization was done to decompress his bladder under sterile conditions in the case. Sterile dressings were applied. All final counts sponge, needle and instrument are found to be correct. He is taken recovery in satisfactory  condition extubated.

## 2012-04-14 NOTE — Progress Notes (Signed)
Subjective:  Seen s/p exploratory laparotomy with lysis of adhesions and small bowel resection x 2(Dr Cornett)   Vital signs in last 24 hours: Filed Vitals:   04/14/12 1152 04/14/12 1200 04/14/12 1215 04/14/12 1230  BP: 112/70 115/62 129/68 147/69  Pulse: 81 81 82 78  Temp: 96.8 F (36 Mora)     TempSrc:      Resp: 21 19 24 25   Height:      Weight:      SpO2: 100% 100% 100% 100%   Weight change: -0.772 kg (-1 lb 11.2 oz)  Intake/Output Summary (Last 24 hours) at 04/14/12 1402 Last data filed at 04/14/12 1119  Gross per 24 hour  Intake   2250 ml  Output    750 ml  Net   1500 ml   Labs: Basic Metabolic Panel:  Lab 04/30/12 5784 04/12/12 0620 04/10/12 0659 04/08/12 0800  NA 137 136 140 --  K 4.9 4.8 4.9 --  CL 87* 90* 94* --  CO2 24 22 20  --  GLUCOSE 79 70 76 --  BUN 79* 60* 84* --  CREATININE 14.26* 11.38* 11.92* --  CALCIUM 9.6 9.8 9.8 --  ALB -- -- -- --  PHOS 11.1* -- 12.0* 12.3*   Liver Function Tests:  Lab Apr 30, 2012 0650 04/10/12 0659 04/08/12 0800  AST -- -- --  ALT -- -- --  ALKPHOS -- -- --  BILITOT -- -- --  PROT -- -- --  ALBUMIN 3.4* 3.2* 3.5   No results found for this basename: LIPASE:3,AMYLASE:3 in the last 168 hours No results found for this basename: AMMONIA:3 in the last 168 hours CBC:  Lab 04/30/12 0650 04/12/12 0620 04/10/12 0659 04/08/12 0639  WBC 8.0 7.9 7.2 --  NEUTROABS -- -- -- --  HGB 15.1 15.4 15.1 --  HCT 44.0 45.7 43.9 --  MCV 84.1 86.2 83.3 85.0  PLT 151 123* 139* --   Cardiac Enzymes: No results found for this basename: CKTOTAL:5,CKMB:5,CKMBINDEX:5,TROPONINI:5 in the last 168 hours CBG: No results found for this basename: GLUCAP:5 in the last 168 hours  Iron Studies: No results found for this basename: IRON,TIBC,TRANSFERRIN,FERRITIN in the last 72 hours Studies/Results: Dg Abd 2 Views  30-Apr-2012  *RADIOLOGY REPORT*  Clinical Data: Abdominal pain, weakness.  ABDOMEN - 2 VIEW  Comparison: Abdominal radiograph 04/12/2012.   Findings: There is some gas, stool and residual oral contrast material noted within the colon.  There is a paucity of distal colonic and rectal gas. Visualized portions of the colon do not appear dilated.  Several dilated loops of small bowel are seen in the upper abdomen, measuring up to 4.2 cm in diameter.  No gross evidence of pneumoperitoneum is noted on this limited supine view of the abdomen.  Multiple surgical clips are seen throughout the pelvis.  Numerous vascular calcifications are noted.  IMPRESSION: 1.  The bowel gas pattern is again most consistent with a partial small-bowel obstruction, as above.  The appearance is very similar to the recent prior examination.  Original Report Authenticated By: Florencia Reasons, M.D.   Medications:    . dextrose 5 % and 0.9% NaCl        . amLODipine  5 mg Oral Daily  . diphenhydrAMINE  12.5 mg Intravenous Once  . ertapenem  1 g Intravenous To OR  . HYDROmorphone      . HYDROmorphone      . lanthanum  2,000 mg Oral TID WC  . morphine   Intravenous Q4H  .  polyethylene glycol  17 g Oral BID  . predniSONE  5 mg Oral Q breakfast  . DISCONTD: ertapenem  1 g Intravenous 60 min Pre-Op    I  have reviewed scheduled and prn medications.  Physical Exam:  General: thin, sedate post-op; opens eyes and responds approp; NAD Heart: RRR, 2/6 SEM Lungs: diminished but clear; no adventitious B.S. Abdomen: NGT intact to suction; bulky abd dressing; no BS present   Extremities: no ankle edema  Dialysis Access: left thigh AVG +T/B   Dialysis Orders: Center: [facility] on TTS.  EDW 52.5 kg HD Bath 2K/2Ca Time 3.5 hrs Heparin 5000 U. Access AVG @ left thigh BFR 400 DFR 800 Zemplar 0 mcg IV/HD Epogen 0 Units IV/HD Venofer 0.   Impression/Plan  1. SBO- s/p exp lap with small bowel resection x 2 today; transferred to 2900; no special drips; has PCA morphine; surgery following  2. ESRD - HD TTS at [facility]; HD tomorrow; K level 4.9, significant weight loss since adm as he  is 7.9 kg under his previous eDW;  UF goal, New eDW at time of discharge 3. Hypertension/volume - labile BP's; no BP meds at home; off and on Norvasc and Labetalol since adm; became hypotensive and now on low dose Norvasc 5mg  since 7/20 with BP currently 162/80-94 post-op; Norvasc changed to qhs (was getting 10am); D/[facility]ora IVF at 75cc/hr; UF goal 2L with HD tomorrow; may need ^ goal depending on BP; needs follow 4. Anemia - Hgb 14.0 post-op (prev 15.4), no Epo or Fe, same as outpt; follow 5. Metabolic bone disease - Ca 9.6, phos 11.1 (as high as 12.3 despite NPO status). Cont Fosrenol 2 gm TID when taking po; follow 6. Nutrition -Alb 3.4; NPO w/NGT; follow. 7. S/p renal transplant - at [facility] in '87, started HD in 04/2003, remains on Prednisone.  8. Hepatitis Mora/thrombocytopenia- thrombocytopenia resolving; plt 133; cont no heparin with HD and follow 9. Disposition- per primary  Samuel Germany, FNP-[facility] Pager [phone]  04/14/2012,2:02 PM  LOS: 12 days   Post op sedated in fetal position on right side. Sister at bedside.  Patient with physical findings of weight loss. Will avoid fluid removal with dialysis tomorrow. [name]

## 2012-04-15 ENCOUNTER — Inpatient Hospital Stay (HOSPITAL_COMMUNITY): Payer: Medicare Other

## 2012-04-15 ENCOUNTER — Inpatient Hospital Stay (HOSPITAL_COMMUNITY): Payer: Medicare Other | Admitting: Certified Registered"

## 2012-04-15 ENCOUNTER — Encounter (HOSPITAL_COMMUNITY): Payer: Self-pay | Admitting: Certified Registered"

## 2012-04-15 ENCOUNTER — Other Ambulatory Visit: Payer: Self-pay

## 2012-04-15 ENCOUNTER — Encounter (HOSPITAL_COMMUNITY): Payer: Self-pay | Admitting: Surgery

## 2012-04-15 ENCOUNTER — Encounter (HOSPITAL_COMMUNITY)
Admission: EM | Disposition: A | Discharge status: Skilled Nursing Facility | Payer: Self-pay | Source: Home / Self Care | Attending: Internal Medicine

## 2012-04-15 DIAGNOSIS — I10 Essential (primary) hypertension: Secondary | ICD-10-CM | POA: Diagnosis present

## 2012-04-15 DIAGNOSIS — K9189 Other postprocedural complications and disorders of digestive system: Secondary | ICD-10-CM | POA: Diagnosis not present

## 2012-04-15 DIAGNOSIS — J9600 Acute respiratory failure, unspecified whether with hypoxia or hypercapnia: Secondary | ICD-10-CM

## 2012-04-15 DIAGNOSIS — T82898A Other specified complication of vascular prosthetic devices, implants and grafts, initial encounter: Secondary | ICD-10-CM

## 2012-04-15 DIAGNOSIS — E46 Unspecified protein-calorie malnutrition: Secondary | ICD-10-CM | POA: Diagnosis present

## 2012-04-15 DIAGNOSIS — R000 Tachycardia, unspecified: Secondary | ICD-10-CM | POA: Diagnosis present

## 2012-04-15 DIAGNOSIS — J9601 Acute respiratory failure with hypoxia: Secondary | ICD-10-CM | POA: Diagnosis not present

## 2012-04-15 DIAGNOSIS — Z7952 Long term (current) use of systemic steroids: Secondary | ICD-10-CM

## 2012-04-15 DIAGNOSIS — K929 Disease of digestive system, unspecified: Secondary | ICD-10-CM

## 2012-04-15 DIAGNOSIS — IMO0002 Reserved for concepts with insufficient information to code with codable children: Secondary | ICD-10-CM

## 2012-04-15 LAB — GLUCOSE, CAPILLARY: Glucose-Capillary: 75 mg/dL (ref 70–99)

## 2012-04-15 LAB — BASIC METABOLIC PANEL
CO2: 21 mEq/L (ref 19–32)
Chloride: 103 mEq/L (ref 96–112)
Glucose, Bld: 83 mg/dL (ref 70–99)
Sodium: 144 mEq/L (ref 135–145)

## 2012-04-15 LAB — POCT I-STAT 3, ART BLOOD GAS (G3+)
Bicarbonate: 21.6 mEq/L (ref 20.0–24.0)
Patient temperature: 98
pCO2 arterial: 44.8 mmHg (ref 35.0–45.0)
pH, Arterial: 7.29 — ABNORMAL LOW (ref 7.350–7.450)
pO2, Arterial: 176 mmHg — ABNORMAL HIGH (ref 80.0–100.0)

## 2012-04-15 LAB — CBC
MCH: 28.1 pg (ref 26.0–34.0)
MCH: 28.4 pg (ref 26.0–34.0)
MCHC: 32.3 g/dL (ref 30.0–36.0)
MCHC: 32.7 g/dL (ref 30.0–36.0)
Platelets: 159 10*3/uL (ref 150–400)
Platelets: 123 10*3/uL — ABNORMAL LOW (ref 150–400)
RBC: 5.01 MIL/uL (ref 4.22–5.81)
RBC: 3.7 MIL/uL — ABNORMAL LOW (ref 4.22–5.81)
RDW: 14.3 % (ref 11.5–15.5)

## 2012-04-15 SURGERY — THROMBECTOMY AND REVISION OF ARTERIOVENTOUS (AV) GORETEX  GRAFT
Anesthesia: General | Site: Thigh | Laterality: Left | Wound class: Clean

## 2012-04-15 MED ORDER — HYDROCORTISONE SOD SUCCINATE 100 MG IJ SOLR
50.0000 mg | Freq: Two times a day (BID) | INTRAMUSCULAR | Status: DC
  Administered 2012-04-16: 50 mg via INTRAVENOUS
  Filled 2012-04-15 (×4): qty 1

## 2012-04-15 MED ORDER — HEPARIN SODIUM (PORCINE) 1000 UNIT/ML IJ SOLN
INTRAMUSCULAR | Status: DC | PRN
  Administered 2012-04-15: 4000 [IU] via INTRAVENOUS

## 2012-04-15 MED ORDER — BIOTENE DRY MOUTH MT LIQD
15.0000 mL | Freq: Two times a day (BID) | OROMUCOSAL | Status: DC
  Administered 2012-04-16 – 2012-05-01 (×28): 15 mL via OROMUCOSAL

## 2012-04-15 MED ORDER — MIDAZOLAM HCL 2 MG/2ML IJ SOLN
1.0000 mg | INTRAMUSCULAR | Status: DC | PRN
  Administered 2012-04-15: 1 mg via INTRAVENOUS
  Administered 2012-04-16: 2 mg via INTRAVENOUS
  Filled 2012-04-15 (×2): qty 2

## 2012-04-15 MED ORDER — FENTANYL CITRATE 0.05 MG/ML IJ SOLN
INTRAMUSCULAR | Status: DC | PRN
  Administered 2012-04-15: 50 ug via INTRAVENOUS
  Administered 2012-04-15: 100 ug via INTRAVENOUS
  Administered 2012-04-15: 50 ug via INTRAVENOUS

## 2012-04-15 MED ORDER — SODIUM CHLORIDE 0.9 % IV SOLN
INTRAVENOUS | Status: DC | PRN
  Administered 2012-04-15 (×2): via INTRAVENOUS

## 2012-04-15 MED ORDER — PHENYLEPHRINE HCL 10 MG/ML IJ SOLN
INTRAMUSCULAR | Status: DC | PRN
  Administered 2012-04-15 (×3): 40 ug via INTRAVENOUS

## 2012-04-15 MED ORDER — LIDOCAINE-EPINEPHRINE (PF) 1 %-1:200000 IJ SOLN
INTRAMUSCULAR | Status: AC
  Filled 2012-04-15: qty 10

## 2012-04-15 MED ORDER — ALBUTEROL SULFATE HFA 108 (90 BASE) MCG/ACT IN AERS
8.0000 | INHALATION_SPRAY | Freq: Four times a day (QID) | RESPIRATORY_TRACT | Status: DC
  Administered 2012-04-15 – 2012-04-16 (×3): 8 via RESPIRATORY_TRACT
  Filled 2012-04-15: qty 6.7

## 2012-04-15 MED ORDER — PROPOFOL 10 MG/ML IV EMUL
INTRAVENOUS | Status: DC | PRN
  Administered 2012-04-15: 100 mg via INTRAVENOUS

## 2012-04-15 MED ORDER — LIDOCAINE HCL (CARDIAC) 20 MG/ML IV SOLN
INTRAVENOUS | Status: DC | PRN
  Administered 2012-04-15: 60 mg via INTRAVENOUS

## 2012-04-15 MED ORDER — 0.9 % SODIUM CHLORIDE (POUR BTL) OPTIME
TOPICAL | Status: DC | PRN
  Administered 2012-04-15: 1000 mL

## 2012-04-15 MED ORDER — ROCURONIUM BROMIDE 100 MG/10ML IV SOLN
INTRAVENOUS | Status: DC | PRN
  Administered 2012-04-15: 30 mg via INTRAVENOUS

## 2012-04-15 MED ORDER — ONDANSETRON HCL 4 MG/2ML IJ SOLN: 4.0000 mg | Freq: Once | INTRAMUSCULAR | Status: DC | PRN

## 2012-04-15 MED ORDER — CHLORHEXIDINE GLUCONATE 0.12 % MT SOLN
15.0000 mL | Freq: Two times a day (BID) | OROMUCOSAL | Status: DC
  Administered 2012-04-15 – 2012-04-23 (×16): 15 mL via OROMUCOSAL
  Filled 2012-04-15 (×17): qty 15

## 2012-04-15 MED ORDER — ADENOSINE 12 MG/4ML IV SOLN
12.0000 mg | Freq: Once | INTRAVENOUS | Status: DC
  Filled 2012-04-15: qty 4

## 2012-04-15 MED ORDER — CLINIMIX/DEXTROSE (5/15) 5 % IV SOLN
INTRAVENOUS | Status: AC
  Filled 2012-04-15: qty 1000

## 2012-04-15 MED ORDER — HYDROMORPHONE HCL PF 1 MG/ML IJ SOLN: 0.2500 mg | INTRAMUSCULAR | Status: DC | PRN

## 2012-04-15 MED ORDER — SUCCINYLCHOLINE CHLORIDE 20 MG/ML IJ SOLN
INTRAMUSCULAR | Status: DC | PRN
  Administered 2012-04-15: 120 mg via INTRAVENOUS

## 2012-04-15 MED ORDER — PHENYLEPHRINE HCL 10 MG/ML IJ SOLN
30.0000 ug/min | INTRAVENOUS | Status: DC
  Filled 2012-04-15: qty 1

## 2012-04-15 MED ORDER — METOPROLOL TARTRATE 1 MG/ML IV SOLN
INTRAVENOUS | Status: DC | PRN
  Administered 2012-04-15: 2.5 mg via INTRAVENOUS

## 2012-04-15 MED ORDER — FAMOTIDINE IN NACL 20-0.9 MG/50ML-% IV SOLN
20.0000 mg | Freq: Two times a day (BID) | INTRAVENOUS | Status: DC
  Administered 2012-04-16 (×3): 20 mg via INTRAVENOUS
  Filled 2012-04-15 (×3): qty 50

## 2012-04-15 MED ORDER — FENTANYL CITRATE 0.05 MG/ML IJ SOLN
50.0000 ug | INTRAMUSCULAR | Status: DC | PRN
  Administered 2012-04-15 – 2012-04-16 (×2): 100 ug via INTRAVENOUS
  Filled 2012-04-15 (×2): qty 2

## 2012-04-15 MED ORDER — ALBUMIN HUMAN 5 % IV SOLN
INTRAVENOUS | Status: DC | PRN
  Administered 2012-04-15 (×3): via INTRAVENOUS

## 2012-04-15 MED ORDER — THROMBIN 20000 UNITS EX SOLR
CUTANEOUS | Status: AC
  Filled 2012-04-15: qty 20000

## 2012-04-15 MED ORDER — SODIUM CHLORIDE 0.9 % IR SOLN
Status: DC | PRN
  Administered 2012-04-15: 14:00:00

## 2012-04-15 MED ORDER — ACETAMINOPHEN 10 MG/ML IV SOLN: 1000.0000 mg | Freq: Once | INTRAVENOUS | Status: AC | PRN

## 2012-04-15 MED ORDER — PHENYLEPHRINE HCL 10 MG/ML IJ SOLN
10.0000 mg | INTRAVENOUS | Status: DC | PRN
  Administered 2012-04-15: 50 ug/min via INTRAVENOUS

## 2012-04-15 SURGICAL SUPPLY — 47 items
ADH SKN CLS APL DERMABOND .7 (GAUZE/BANDAGES/DRESSINGS) ×1
CANISTER SUCTION 2500CC (MISCELLANEOUS) ×2 IMPLANT
CATH EMB 4FR 80CM (CATHETERS) ×2 IMPLANT
CLIP TI MEDIUM 6 (CLIP) ×2 IMPLANT
CLIP TI WIDE RED SMALL 6 (CLIP) ×3 IMPLANT
CLOTH BEACON ORANGE TIMEOUT ST (SAFETY) ×2 IMPLANT
COVER SURGICAL LIGHT HANDLE (MISCELLANEOUS) ×2 IMPLANT
DERMABOND ADVANCED (GAUZE/BANDAGES/DRESSINGS) ×1
DERMABOND ADVANCED .7 DNX12 (GAUZE/BANDAGES/DRESSINGS) ×1 IMPLANT
DRAPE INCISE IOBAN 66X45 STRL (DRAPES) ×1 IMPLANT
DRAPE ORTHO SPLIT 77X108 STRL (DRAPES) ×2
DRAPE SURG ORHT 6 SPLT 77X108 (DRAPES) IMPLANT
DRAPE X-RAY CASS 24X20 (DRAPES) IMPLANT
ELECT REM PT RETURN 9FT ADLT (ELECTROSURGICAL) ×2
ELECTRODE REM PT RTRN 9FT ADLT (ELECTROSURGICAL) ×1 IMPLANT
GAUZE SPONGE 4X4 16PLY XRAY LF (GAUZE/BANDAGES/DRESSINGS) IMPLANT
GEL ULTRASOUND 20GR AQUASONIC (MISCELLANEOUS) IMPLANT
GLOVE BIO SURGEON STRL SZ 6.5 (GLOVE) ×1 IMPLANT
GLOVE BIO SURGEON STRL SZ7.5 (GLOVE) ×4 IMPLANT
GLOVE BIOGEL PI IND STRL 6.5 (GLOVE) IMPLANT
GLOVE BIOGEL PI IND STRL 7.0 (GLOVE) IMPLANT
GLOVE BIOGEL PI IND STRL 7.5 (GLOVE) ×1 IMPLANT
GLOVE BIOGEL PI INDICATOR 6.5 (GLOVE) ×2
GLOVE BIOGEL PI INDICATOR 7.0 (GLOVE) ×1
GLOVE BIOGEL PI INDICATOR 7.5 (GLOVE) ×3
GOWN PREVENTION PLUS XLARGE (GOWN DISPOSABLE) ×2 IMPLANT
GOWN STRL NON-REIN LRG LVL3 (GOWN DISPOSABLE) ×5 IMPLANT
GRAFT GORETEX 6X10 (Vascular Products) ×1 IMPLANT
KIT BASIN OR (CUSTOM PROCEDURE TRAY) ×2 IMPLANT
KIT ROOM TURNOVER OR (KITS) ×2 IMPLANT
LOOP VESSEL MAXI BLUE (MISCELLANEOUS) ×1 IMPLANT
NS IRRIG 1000ML POUR BTL (IV SOLUTION) ×2 IMPLANT
PACK CV ACCESS (CUSTOM PROCEDURE TRAY) ×2 IMPLANT
PAD ARMBOARD 7.5X6 YLW CONV (MISCELLANEOUS) ×5 IMPLANT
SET COLLECT BLD 21X3/4 12 (NEEDLE) IMPLANT
SPONGE SURGIFOAM ABS GEL 100 (HEMOSTASIS) IMPLANT
STOPCOCK 4 WAY LG BORE MALE ST (IV SETS) IMPLANT
SUT PROLENE 5 0 C 1 24 (SUTURE) ×2 IMPLANT
SUT PROLENE 6 0 BV (SUTURE) ×4 IMPLANT
SUT VIC AB 3-0 SH 27 (SUTURE) ×2
SUT VIC AB 3-0 SH 27X BRD (SUTURE) ×1 IMPLANT
SUT VICRYL 4-0 PS2 18IN ABS (SUTURE) ×2 IMPLANT
TOWEL OR 17X24 6PK STRL BLUE (TOWEL DISPOSABLE) ×2 IMPLANT
TOWEL OR 17X26 10 PK STRL BLUE (TOWEL DISPOSABLE) ×2 IMPLANT
TUBING EXTENTION W/L.L. (IV SETS) IMPLANT
UNDERPAD 30X30 INCONTINENT (UNDERPADS AND DIAPERS) ×2 IMPLANT
WATER STERILE IRR 1000ML POUR (IV SOLUTION) ×2 IMPLANT

## 2012-04-15 NOTE — Progress Notes (Signed)
1 Day Post-Op  Subjective: Awake and alert this am.   Objective: Vital signs in last 24 hours: Temp:  [96.8 F (36 C)-98.7 F (37.1 C)] 98.3 F (36.8 C) (07/25 0610) Pulse Rate:  [77-120] 111  (07/25 0610) Resp:  [9-25] 14  (07/25 0610) BP: (92-164)/(59-86) 92/59 mmHg (07/25 0610) SpO2:  [95 %-100 %] 100 % (07/25 0610) Weight:  [102 lb 15.3 oz (46.7 kg)-110 lb 14.3 oz (50.3 kg)] 102 lb 15.3 oz (46.7 kg) (07/25 0610) Last BM Date: 04/12/12  Intake/Output from previous day: 07/24 0701 - 07/25 0700 In: 2420.5 [I.V.:1670.5; IV Piggyback:750] Out: 1150 [Emesis/NG output:400; Blood:350] Intake/Output this shift:    General appearance: alert, cooperative, appears stated age and no distress, cachetic appearance. Chest: CTA Cardiovascular: tachycardic Abdomen: appropriately tender, dressing is C/D/I No BS, bloating. Extremities: no edema. (AV graft reportedly occluded). WBC trended up a bit, likely a stress response to ex-lap. Afebrile  Lab Results:   Basename 04/15/12 0500 04/14/12 1445  WBC 16.0* 12.8*  HGB 14.1 14.0  HCT 43.6 42.8  PLT 159 133*   BMET  Basename 04/14/12 1445 April 24, 2012 0650  NA 140 137  K 5.4* 4.9  CL 96 87*  CO2 22 24  GLUCOSE 83 79  BUN 50* 79*  CREATININE 10.28* 14.26*  CALCIUM 8.6 9.6   PT/INR No results found for this basename: LABPROT:2,INR:2 in the last 72 hours ABG No results found for this basename: PHART:2,PCO2:2,PO2:2,HCO3:2 in the last 72 hours  Studies/Results: Dg Abd 2 Views  April 24, 2012  *RADIOLOGY REPORT*  Clinical Data: Abdominal pain, weakness.  ABDOMEN - 2 VIEW  Comparison: Abdominal radiograph 04/12/2012.  Findings: There is some gas, stool and residual oral contrast material noted within the colon.  There is a paucity of distal colonic and rectal gas. Visualized portions of the colon do not appear dilated.  Several dilated loops of small bowel are seen in the upper abdomen, measuring up to 4.2 cm in diameter.  No gross evidence  of pneumoperitoneum is noted on this limited supine view of the abdomen.  Multiple surgical clips are seen throughout the pelvis.  Numerous vascular calcifications are noted.  IMPRESSION: 1.  The bowel gas pattern is again most consistent with a partial small-bowel obstruction, as above.  The appearance is very similar to the recent prior examination.  Original Report Authenticated By: Florencia Reasons, M.D.    Anti-infectives: Anti-infectives     Start     Dose/Rate Route Frequency Ordered Stop   04/14/12 0830   ertapenem (INVANZ) 1 g in sodium chloride 0.9 % 50 mL IVPB        1 g 100 mL/hr over 30 Minutes Intravenous To Surgery 04/14/12 0816 04/14/12 0834   2012-04-24 1359   ertapenem (INVANZ) 1 g in sodium chloride 0.9 % 50 mL IVPB  Status:  Discontinued        1 g 100 mL/hr over 30 Minutes Intravenous 60 min pre-op 2012/04/24 1359 04/14/12 0816          Assessment/Plan: s/p Procedure(s) (LRB): EXPLORATORY LAPAROTOMY (N/A) LYSIS OF ADHESION (N/A) COLON RESECTION (N/A) 1. Remain NPO 2. Occluded AV graft (patient to or today for thrombectomy.) Will likely be on Heparin post-op so close observation of abdominal wound for s/s bleeding and PT and INR will be important. 3. Begin wet to dry dressing changes today.   LOS: 13 days    [name] 04/15/2012

## 2012-04-15 NOTE — Progress Notes (Signed)
[facility] Progress Note [place] TEAM 1 - Stepdown/ICU TEAM   [name] ZOX:[id number] DOB: 04/15/1961 DOA: 04/02/2012 PCP: Trevor Iha, MD  Brief narrative: 51 year old male chronic kidney disease on dialysis. Prior surgical history includes renal transplant 17 years prior and prostatectomy 2012. Patient presented with abdominal pain with nausea and vomiting acute onset 24 hours prior to presentation. The severity of the pain increased progressively is primarily located in the periumbilical region. Once the patient began to have vomiting he decided he needed to present to the emergency department. His pain was relieved after a dose of IV narcotics. CT scan abdomen and pelvis completed in the emergency department demonstrated small bowel obstruction. Since admission this patient has been evaluated by surgery and he has subsequently undergone exploratory laparotomy with lysis of adhesions and focal small bowel resection x2. Postoperatively he was placed in the step down ICU and team 1 has assumed care of this patient.  Assessment/Plan: Principal Problem:  *SBO (small bowel obstruction) s/p EL/LOA and SBR x 2/ Ileus following gastrointestinal surgery *Primary management per surgical team *Clinical picture consistent with postoperative ileus therefore surgery continuing n.p.o. status and NG tube  Active Problems:  Leg graft occlusion *Acute occlusion of dialysis leg graft with plans to proceed with ectomy versus graft revision by vascular surgery   Acute respiratory failure with hypoxia *Started on oxygen postoperatively with slow weaning of nasal cannula *Appears stated without evidence of tachypnea or labored effort   Thrombocytopenia *Presented with thrombocytopenia and nadir of 87,000 *Current platelets have rebounded and are normal at 159,000   HTN (hypertension) *Blood pressure soft and since patient n.p.o. status and ileus we'll go ahead and hold Norvasc and  monitor *Can utilize IV antihypertensives as indicated   Malnutrition, calorie *This patient is very cachectic appearing and based on intraoperative note bowel obstructions appear to be chronic *Albumin was not helpful due to patient's dehydrated state *Appreciate nutritional team evaluation-recommendation is to begin parenteral nutrition therefore requested PICC line to be inserted and pharmacy to assist with TNA dosing   Sinus tachycardia *Physiologic related to volume depletion as well as deconditioning and pain *Continue to monitor   CKD (chronic kidney disease) stage V requiring chronic dialysis *Appreciate nephrology assistance *See above issues regarding occluded thigh graft   Erythrocytosis *Likely due to volume depletion at presentation *Hemoglobin stable at 14 -baseline unknown   Gout/Chronic use of steroids *No evidence of acute exacerbation *Since n.p.o. we'll change daily prednisone 5 mg 2 Solu-Cortef 40 mg IV every 12  Leukocytosis *No fever and likely related to expected inflammatory change from recent surgery    Code Status: Full  Family Communication: Communicated directly with the patient  Disposition Plan: Pt has returned from OR on Neosynephrine- will transfer to ICU team for now- discussed with Dr Vassie Loll Discharge either to CIR or to skilled nursing facility. Patient amenable to either disposition  Consultants: Dr. Zacarias Pontes surgery Dr. Powell/nephrology Dr. Callie Fielding surgery Dr. Borden/urology  Procedures: Exploratory laparotomy with lysis of adhesions and small bowel resection x2 on 04/14/2012 by Dr. Luisa Hart  Antibiotics: Pincus Sanes IV 7/23>>>7/24  HPI/Subjective: Patient alert and complaining of expected incisional pain, denies chest pain shortness of breath. States not passing flatus    Objective: Blood pressure 102/54, pulse 113, temperature 98.9 F (37.2 C), temperature source Oral, resp. rate 20, height 5\' 11"  (1.803 m), weight  46.7 kg (102 lb 15.3 oz), SpO2 97.00%.  Intake/Output Summary (Last 24 hours) at 04/15/12 1145 Last data filed at 04/15/12  0900  Gross per 24 hour  Intake  220.5 ml  Output    400 ml  Net -179.5 ml     Exam: General: No acute respiratory distress, alert but cachectic appearing male who appears older than stated age  Lungs: Clear to auscultation bilaterally without wheezes or crackles, 2 L nasal cannula oxygen  Cardiovascular: Regular rate andslightly tachycardic  rhythm without murmur gallop or rub normal S1 and S2, no peripheral edema or cyanosis  Abdomen: Nontender, nondistended, soft, bowel sounds positive, no rebound, no ascites, no appreciable mass Musculoskeletal: No significant cyanosis, clubbing of  Extremities, thin-appearing Neurological: Patient awake and oriented x3 moving all extremities x4 strength estimated 4/5 bilaterally  Data Reviewed: Basic Metabolic Panel:  Lab 04/14/12 1610 04/13/12 0650 04/12/12 0620 04/10/12 0659  NA 140 137 136 140  K 5.4* 4.9 4.8 4.9  CL 96 87* 90* 94*  CO2 22 24 22 20   GLUCOSE 83 79 70 76  BUN 50* 79* 60* 84*  CREATININE 10.28* 14.26* 11.38* 11.92*  CALCIUM 8.6 9.6 9.8 9.8  MG -- -- -- --  PHOS -- 11.1* -- 12.0*   Liver Function Tests:  Lab 04/13/12 0650 04/10/12 0659  AST -- --  ALT -- --  ALKPHOS -- --  BILITOT -- --  PROT -- --  ALBUMIN 3.4* 3.2*   No results found for this basename: LIPASE:5,AMYLASE:5 in the last 168 hours No results found for this basename: AMMONIA:5 in the last 168 hours CBC:  Lab 04/15/12 0500 04/14/12 1445 04/13/12 0650 04/12/12 0620 04/10/12 0659  WBC 16.0* 12.8* 8.0 7.9 7.2  NEUTROABS -- -- -- -- --  HGB 14.1 14.0 15.1 15.4 15.1  HCT 43.6 42.8 44.0 45.7 43.9  MCV 87.0 87.0 84.1 86.2 83.3  PLT 159 133* 151 123* 139*   Cardiac Enzymes: No results found for this basename: CKTOTAL:5,CKMB:5,CKMBINDEX:5,TROPONINI:5 in the last 168 hours BNP (last 3 results) No results found for this basename:  PROBNP:3 in the last 8760 hours CBG: No results found for this basename: GLUCAP:5 in the last 168 hours  No results found for this or any previous visit (from the past 240 hour(s)).   Studies:  Recent x-ray studies have been reviewed in detail by the Attending Physician  Scheduled Meds:  Reviewed in detail by the Attending Physician   Junious Silk, ANP [facility] Office  [phone] Pager [phone]  On-Call/Text Page:      [email or web address]      password [email or web address]  If 7PM-7AM, please contact night-coverage [email or web address] Password [email or web address] 04/15/2012, 11:45 AM   LOS: 13 days    I have examined the patient and reviewed the chart. I have modified the above note and agree with it.  Calvert Cantor, MD [phone]

## 2012-04-15 NOTE — Consult Note (Signed)
[facility] [place] CONSULT NOTE 04/15/2012 DOB: [birth year] MRN : [id number]  CC: occluded left thigh graft s/p bowel surgery Referring Physician: Dr. Lowell Guitar  History of Present Illness: This is a 51 year old male with past medical history of end-stage renal disease which dialyzes Monday Wednesdays and Fridays, who had a renal transplant about 17 years ago, prostatectomy in 2012, comes in for abdominal pain nausea or vomiting. He relates one day prior to admission he started having abdominal pain which progressively got worse it was mainly periumbilical nothing made it better or worse had no radiation. But in the middle of the night he took some TUMS and the pain did not improve. After this he started vomiting about 3 or 4 times. So he decided to come here to the emergency room. Here in the emergency room his pain was relieved by narcotics. A CT scan of the abdomen and pelvis was done that shows small bowel obstruction we were asked to mid and further evaluate.   Procedure: Exploratory laparotomy with lysis of adhesions and small bowel resection x2 by Clovis Pu. Cornett, MD.  S/P surgery His previously working left thigh graft occluded.         Past Medical History  Diagnosis Date  . Dialysis patient   . Prostate cancer     History reviewed. No pertinent past surgical history.   ROS: [x]  Positive  [ ]  Denies    General: [x ] Weight loss, [ ]  Fever, [ ]  chills Neurologic: [ ]  Dizziness, [ ]  Blackouts, [ ]  Seizure [ ]  Stroke, [ ]  "Mini stroke", [ ]  Slurred speech, [ ]  Temporary blindness; [ ]  weakness in arms or legs, [ ]  Hoarseness Cardiac: [ ]  Chest pain/pressure, [ ]  Shortness of breath at rest [ ]  Shortness of breath with exertion, [ ]  Atrial fibrillation or irregular heartbeat Vascular: [ ]  Pain in legs with walking, [ ]  Pain in legs at rest, [ ]  Pain in legs at night,  [ ]  Non-healing ulcer, [ ]  Blood clot in vein/DVT,   Pulmonary: [ ]  Home oxygen, [ ]  Productive  cough, [ ]  Coughing up blood, [ ]  Asthma,  [ ]  Wheezing Musculoskeletal:  [ ]  Arthritis, [ ]  Low back pain, [ ]  Joint pain Hematologic: [ ]  Easy Bruising, [ ]  Anemia; [ ]  Hepatitis Gastrointestinal: [ ]  Blood in stool, [ ]  Gastroesophageal Reflux/heartburn, [ ]  Trouble swallowing Urinary: x[ ]  chronic Kidney disease, [ ]  on HD - [x ] MWF or [ ]  TTHS, [ ]  Burning with urination, [ ]  Difficulty urinating Skin: [ ]  Rashes, [ ]  Wounds Psychological: [ ]  Anxiety, [ ]  Depression  Social History History  Substance Use Topics  . Smoking status: Never Smoker   . Smokeless tobacco: Not on file  . Alcohol Use: No    Family History Family History  Problem Relation Age of Onset  . Hypertension Father   . Pneumonia Brother   . Lung disease Brother     Allergies  Allergen Reactions  . Allopurinol     REACTION: decreased platelets  . Aspirin     REACTION: unspecified    Current Facility-Administered Medications  Medication Dose Route Frequency Provider Last Rate Last Dose  . 0.9 %  sodium chloride infusion   Intravenous Continuous Belkys A Regalado, MD 10 mL/hr at 04/14/12 1600 10 mL/hr at 04/14/12 1600  . acetaminophen (TYLENOL) tablet 650 mg  650 mg Oral Q6H PRN Marinda Elk, MD   650 mg  at 04/10/12 1113   Or  . acetaminophen (TYLENOL) suppository 650 mg  650 mg Rectal Q6H PRN Marinda Elk, MD      . amLODipine (NORVASC) tablet 5 mg  5 mg Oral QHS Lizbeth Bark, FNP      . diphenhydrAMINE (BENADRYL) injection 12.5 mg  12.5 mg Intravenous Q6H PRN Thomas A. Cornett, MD       Or  . diphenhydrAMINE (BENADRYL) 12.5 MG/5ML elixir 12.5 mg  12.5 mg Oral Q6H PRN Thomas A. Cornett, MD      . diphenhydrAMINE (BENADRYL) injection 12.5 mg  12.5 mg Intravenous Once Rolan Lipa, NP      . ertapenem [facility]) 1 g in sodium chloride 0.9 % 50 mL IVPB  1 g Intravenous To OR Belkys A Regalado, MD   1 g at 04/14/12 0834  . hydrALAZINE (APRESOLINE) injection 10 mg  10 mg Intravenous  Q6H PRN Rolan Lipa, NP   10 mg at 04/13/12 1126  . HYDROmorphone (DILAUDID) 1 MG/ML injection           . HYDROmorphone (DILAUDID) 1 MG/ML injection           . lanthanum (FOSRENOL) chewable tablet 2,000 mg  2,000 mg Oral TID WC Maree Krabbe, MD   2,000 mg at 04/12/12 1753  . morphine 1 MG/ML PCA injection   Intravenous Q4H Thomas A. Cornett, MD   12 mg at 04/15/12 0400  . morphine 1 MG/ML PCA injection           . morphine 4 MG/ML injection 4 mg  4 mg Intravenous Q2H PRN Marinda Elk, MD   4 mg at 04/13/12 1521  . naloxone [facility]) injection 0.4 mg  0.4 mg Intravenous PRN Thomas A. Cornett, MD       And  . sodium chloride 0.9 % injection 9 mL  9 mL Intravenous PRN Thomas A. Cornett, MD      . ondansetron (ZOFRAN) tablet 4 mg  4 mg Oral Q6H PRN Maretta Bees, MD       Or  . ondansetron [facility]) injection 4 mg  4 mg Intravenous Q6H PRN Maretta Bees, MD   4 mg at 04/13/12 1125  . phenol (CHLORASEPTIC) mouth spray 1 spray  1 spray Mouth/Throat PRN Lizbeth Bark, FNP      . predniSONE (DELTASONE) tablet 5 mg  5 mg Oral Q breakfast Marinda Elk, MD   5 mg at 04/13/12 1126  . promethazine (PHENERGAN) injection 12.5 mg  12.5 mg Intravenous Q6H PRN Shanker Levora Dredge, MD      . DISCONTD: 0.9 % irrigation (POUR BTL)    PRN Clovis Pu. Cornett, MD   2,000 mL at 04/14/12 1112  . DISCONTD: amLODipine (NORVASC) tablet 5 mg  5 mg Oral Daily Maree Krabbe, MD   5 mg at 04/12/12 1105  . DISCONTD: dextrose 5 %-0.9 % sodium chloride infusion   Intravenous Continuous Thomas A. Cornett, MD      . DISCONTD: droperidol (INAPSINE) injection 0.625 mg  0.625 mg Intravenous PRN Remonia Richter, MD      . DISCONTD: ertapenem [facility]) 1 g in sodium chloride 0.9 % 50 mL IVPB  1 g Intravenous 60 min Pre-Op Thomas A. Cornett, MD      . DISCONTD: HYDROmorphone (DILAUDID) injection 0.25-0.5 mg  0.25-0.5 mg Intravenous Q5 min PRN Remonia Richter, MD   0.5 mg at 04/14/12 1413  .  DISCONTD: ondansetron (  ZOFRAN) injection 4 mg  4 mg Intravenous Q6H PRN Thomas A. Cornett, MD      . DISCONTD: polyethylene glycol (MIRALAX / GLYCOLAX) packet 17 g  17 g Oral BID Maretta Bees, MD   17 g at 04/12/12 1106   Facility-Administered Medications Ordered in Other Encounters  Medication Dose Route Frequency Provider Last Rate Last Dose  . DISCONTD: 0.9 %  sodium chloride infusion    Continuous PRN Carmela Rima, CRNA      . DISCONTD: albumin human 5 % solution    Continuous PRN Carmela Rima, CRNA      . DISCONTD: fentaNYL (SUBLIMAZE) injection    PRN Carmela Rima, CRNA   50 mcg at 04/14/12 0950  . DISCONTD: glycopyrrolate (ROBINUL) injection    PRN Carmela Rima, CRNA   0.7 mg at 04/14/12 1106  . DISCONTD: hetastarch in lactated electrolyte (HEXTEND) 6 % infusion    Continuous PRN Carmela Rima, CRNA      . DISCONTD: lidocaine (cardiac) 100 mg/73ml (XYLOCAINE) 20 MG/ML injection 2%    PRN Carmela Rima, CRNA   80 mg at 04/14/12 0840  . DISCONTD: neostigmine (PROSTIGMINE) injection   Intravenous PRN Carmela Rima, CRNA   3.5 mg at 04/14/12 1106  . DISCONTD: ondansetron (ZOFRAN) injection    PRN Carmela Rima, CRNA   4 mg at 04/14/12 [phone]  . DISCONTD: phenylephrine (NEO-SYNEPHRINE) injection    PRN Carmela Rima, CRNA   40 mcg at 04/14/12 1105  . DISCONTD: propofol (DIPRIVAN) 10 mg/ml infusion    PRN Carmela Rima, CRNA   120 mg at 04/14/12 0840  . DISCONTD: succinylcholine (ANECTINE) injection    PRN Carmela Rima, CRNA   100 mg at 04/14/12 0840  . DISCONTD: vecuronium (NORCURON) injection    PRN Carmela Rima, CRNA   1 mg at 04/14/12 1049     Imaging: Dg Abd 2 Views  04/13/2012  *RADIOLOGY REPORT*  Clinical Data: Abdominal pain, weakness.  ABDOMEN - 2 VIEW  Comparison: Abdominal radiograph 04/12/2012.  Findings: There is some gas, stool and residual oral contrast material noted within the colon.  There is a paucity of distal colonic  and rectal gas. Visualized portions of the colon do not appear dilated.  Several dilated loops of small bowel are seen in the upper abdomen, measuring up to 4.2 cm in diameter.  No gross evidence of pneumoperitoneum is noted on this limited supine view of the abdomen.  Multiple surgical clips are seen throughout the pelvis.  Numerous vascular calcifications are noted.  IMPRESSION: 1.  The bowel gas pattern is again most consistent with a partial small-bowel obstruction, as above.  The appearance is very similar to the recent prior examination.  Original Report Authenticated By: Florencia Reasons, M.D.    Significant Diagnostic Studies: CBC Lab Results  Component Value Date   WBC 16.0* 04/15/2012   HGB 14.1 04/15/2012   HCT 43.6 04/15/2012   MCV 87.0 04/15/2012   PLT 159 04/15/2012    BMET    Component Value Date/Time   NA 140 04/14/2012 1445   K 5.4* 04/14/2012 1445   CL 96 04/14/2012 1445   CO2 22 04/14/2012 1445   GLUCOSE 83 04/14/2012 1445   BUN 50* 04/14/2012 1445   CREATININE 10.28* 04/14/2012 1445   CALCIUM 8.6 04/14/2012 1445   GFRNONAA 5* 04/14/2012 1445   GFRAA 6* 04/14/2012 1445    COAG No results found for  this basename: INR, PROTIME   No results found for this basename: PTT     Physical Examination BP Readings from Last 3 Encounters:  04/15/12 92/59  04/15/12 92/59  04/15/12 92/59   Temp Readings from Last 3 Encounters:  04/15/12 98.3 F (36.8 C) Oral  04/15/12 98.3 F (36.8 C) Oral  04/15/12 98.3 F (36.8 C) Oral   SpO2 Readings from Last 3 Encounters:  04/15/12 100%  04/15/12 100%  04/15/12 100%   Pulse Readings from Last 3 Encounters:  04/15/12 111  04/15/12 111  04/15/12 111    General:  Alert no acute distress Gait: Normal per nursing the patient stood and brushed his teeth this am Pulmonary: normal non-labored breathing , without Rales, rhonchi,  wheezing Cardiac: tachycardia Abdomen: tender, dressing clean dry and intact Skin: no rashes, ulcers  noted Vascular Exam/Pulses:distal lower extremity skin is clean dry and intact.  Palpable PT pulses bilaterally  Extremities without ischemic changes, no Gangrene , no cellulitis; no open wounds;  Musculoskeletal: no muscle wasting or atrophy  Neurologic: A&O X 3; Appropriate Affect ;  SENSATION: normal; MOTOR FUNCTION: Pt has good and equal strength in all extremities - 4+/5  ASSESSMENT/PLAN:  Non palpable thrill in left thigh graft. The patient will be taken to the OR today by Dr. Edilia Bo for thrombectomy verses revision of graft. Ruther Ephraim MAUREEN PA-C

## 2012-04-15 NOTE — Progress Notes (Signed)
Left AVG negative for Bruit and thrill.  Checked by 2 RNs.  Will notify neophrolgist on days for today.

## 2012-04-15 NOTE — Transfer of Care (Signed)
Immediate Anesthesia Transfer of Care Note  Patient: [name]  Procedure(s) Performed: Procedure(s) (LRB): THROMBECTOMY AND REVISION OF ARTERIOVENTOUS (AV) GORETEX  GRAFT (Left)  Patient Location: PACU  Anesthesia Type: General  Level of Consciousness: sedated  Airway & Oxygen Therapy: Patient remains intubated per anesthesia plan and Patient placed on Ventilator (see vital sign flow sheet for setting)  Post-op Assessment: Report given to PACU RN and Post -op Vital signs reviewed and stable  Post vital signs: Reviewed and stable  Complications: No apparent anesthesia complications

## 2012-04-15 NOTE — Progress Notes (Signed)
CRITICAL VALUE ALERT  Critical value received: Potassium 6.9  Date of notification:  04/15/12  Time of notification:  2020  Critical value read back:yes  Nurse who received alert:  Clois Comber, RN  MD notified (1st page):  Dr Deterding  Time of first page:  2030  MD notified (2nd page):  Time of second page:  Responding MD:  Dr Darrick Penna  Time MD responded:  2030

## 2012-04-15 NOTE — Progress Notes (Signed)
Arrived to 2300 from PACU at 1825, patient remains speepy, will cont. To monitor for weaning.

## 2012-04-15 NOTE — Anesthesia Preprocedure Evaluation (Addendum)
Anesthesia Evaluation  Patient identified by MRN, date of birth, ID band Patient awake    Reviewed: Allergy & Precautions, H&P , NPO status , Patient's Chart, lab work & pertinent test results, reviewed documented beta blocker date and time   Airway       Dental  (+) Teeth Intact and Dental Advisory Given   Pulmonary  breath sounds clear to auscultation        Cardiovascular hypertension, Pt. on medications Rhythm:Regular Rate:Normal     Neuro/Psych    GI/Hepatic GERD-  Poorly Controlled,  Endo/Other    Renal/GU      Musculoskeletal   Abdominal   Peds  Hematology   Anesthesia Other Findings   Reproductive/Obstetrics                          Anesthesia Physical Anesthesia Plan  ASA: III  Anesthesia Plan: General   Post-op Pain Management:    Induction: Intravenous  Airway Management Planned: Oral ETT  Additional Equipment:   Intra-op Plan:   Post-operative Plan: Extubation in OR  Informed Consent:   Dental advisory given  Plan Discussed with: CRNA and Surgeon  Anesthesia Plan Comments: (ESRD with occluded thigh graft K-5.4 S/P exploratory lap for SBO 7/24  Plan GA with Oral ETT  Kipp Brood, MD )       Anesthesia Quick Evaluation

## 2012-04-15 NOTE — Anesthesia Postprocedure Evaluation (Signed)
  Anesthesia Post-op Note  Patient: [name]  Procedure(s) Performed: Procedure(s) (LRB): THROMBECTOMY AND REVISION OF ARTERIOVENTOUS (AV) GORETEX  GRAFT (Left)  Patient Location: PACU  Anesthesia Type: General  Level of Consciousness: sedated and Patient remains intubated per anesthesia plan  Airway and Oxygen Therapy: Patient remains intubated per anesthesia plan and Patient placed on Ventilator (see vital sign flow sheet for setting)  Post-op Pain: none  Post-op Assessment: Post-op Vital signs reviewed and Patient's Cardiovascular Status Stable  Post-op Vital Signs: stable  Complications: No apparent anesthesia complications

## 2012-04-15 NOTE — Consult Note (Signed)
[facility] [place]  Patient name: [name] MRN: [id number] DOB: 12/04/1960 Sex: male  REASON FOR CONSULT: clotted left thigh AV graft  HPI: [name] is a 51 y.o. male was admitted on 04/03/2012 with abdominal pain, nausea and vomiting. He underwent exploratory laparotomy and lysis of adhesions with small bowel resection x2 yesterday. Today it was noted that his left thigh AV graft was occluded. He was not sure when the graft was placed. I do find a recent shuntogram from February of 2013 which showed no problems with the graft.  Past Medical History  Diagnosis Date  . Dialysis patient   . Prostate cancer     Family History  Problem Relation Age of Onset  . Hypertension Father   . Pneumonia Brother   . Lung disease Brother     SOCIAL HISTORY: History  Substance Use Topics  . Smoking status: Never Smoker   . Smokeless tobacco: Not on file  . Alcohol Use: No    Allergies  Allergen Reactions  . Allopurinol     REACTION: decreased platelets  . Aspirin     REACTION: unspecified    Current Facility-Administered Medications  Medication Dose Route Frequency Provider Last Rate Last Dose  . 0.9 %  sodium chloride infusion   Intravenous Continuous Russella Dar, NP 10 mL/hr at 04/14/12 1600 10 mL/hr at 04/14/12 1600  . acetaminophen (TYLENOL) tablet 650 mg  650 mg Oral Q6H PRN Marinda Elk, MD   650 mg at 04/10/12 1113   Or  . acetaminophen (TYLENOL) suppository 650 mg  650 mg Rectal Q6H PRN Marinda Elk, MD      . diphenhydrAMINE (BENADRYL) injection 12.5 mg  12.5 mg Intravenous Q6H PRN Thomas A. Cornett, MD       Or  . diphenhydrAMINE (BENADRYL) 12.5 MG/5ML elixir 12.5 mg  12.5 mg Oral Q6H PRN Thomas A. Cornett, MD      . diphenhydrAMINE (BENADRYL) injection 12.5 mg  12.5 mg Intravenous Once Rolan Lipa, NP      . hydrALAZINE (APRESOLINE) injection 10 mg  10 mg Intravenous Q6H PRN Rolan Lipa, NP   10 mg  at 04/13/12 1126  . hydrocortisone sodium succinate (SOLU-CORTEF) 100 mg/2 mL injection 50 mg  50 mg Intravenous Q12H Russella Dar, NP      . HYDROmorphone (DILAUDID) 1 MG/ML injection           . HYDROmorphone (DILAUDID) 1 MG/ML injection           . morphine 1 MG/ML PCA injection   Intravenous Q4H Thomas A. Cornett, MD   3 mg at 04/15/12 0800  . morphine 1 MG/ML PCA injection           . morphine 4 MG/ML injection 4 mg  4 mg Intravenous Q2H PRN Marinda Elk, MD   4 mg at 04/13/12 1521  . naloxone [facility]) injection 0.4 mg  0.4 mg Intravenous PRN Thomas A. Cornett, MD       And  . sodium chloride 0.9 % injection 9 mL  9 mL Intravenous PRN Thomas A. Cornett, MD      . ondansetron (ZOFRAN) tablet 4 mg  4 mg Oral Q6H PRN Maretta Bees, MD       Or  . ondansetron [facility]) injection 4 mg  4 mg Intravenous Q6H PRN Maretta Bees, MD   4 mg at 04/13/12 1125  . phenol (CHLORASEPTIC) mouth spray 1  spray  1 spray Mouth/Throat PRN Lizbeth Bark, FNP      . promethazine (PHENERGAN) injection 12.5 mg  12.5 mg Intravenous Q6H PRN Maretta Bees, MD      . DISCONTD: amLODipine (NORVASC) tablet 5 mg  5 mg Oral Daily Maree Krabbe, MD   5 mg at 04/12/12 1105  . DISCONTD: amLODipine (NORVASC) tablet 5 mg  5 mg Oral QHS Lizbeth Bark, FNP      . DISCONTD: dextrose 5 %-0.9 % sodium chloride infusion   Intravenous Continuous Thomas A. Cornett, MD      . DISCONTD: droperidol (INAPSINE) injection 0.625 mg  0.625 mg Intravenous PRN Remonia Richter, MD      . DISCONTD: HYDROmorphone (DILAUDID) injection 0.25-0.5 mg  0.25-0.5 mg Intravenous Q5 min PRN Remonia Richter, MD   0.5 mg at 04/14/12 1413  . DISCONTD: lanthanum (FOSRENOL) chewable tablet 2,000 mg  2,000 mg Oral TID WC Maree Krabbe, MD   2,000 mg at 04/12/12 1753  . DISCONTD: ondansetron (ZOFRAN) injection 4 mg  4 mg Intravenous Q6H PRN Thomas A. Cornett, MD      . DISCONTD: polyethylene glycol (MIRALAX / GLYCOLAX) packet 17 g  17  g Oral BID Maretta Bees, MD   17 g at 04/12/12 1106  . DISCONTD: predniSONE (DELTASONE) tablet 5 mg  5 mg Oral Q breakfast Marinda Elk, MD   5 mg at 04/15/12 0907    REVIEW OF SYSTEMS: Arly.Keller ] denotes positive finding; [  ] denotes negative finding CARDIOVASCULAR:  [ ]  chest pain   [ ]  chest pressure   [ ]  palpitations   [ ]  orthopnea   [ ]  dyspnea on exertion   [ ]  claudication   [ ]  rest pain   [ ]  DVT   [ ]  phlebitis PULMONARY:   [ ]  productive cough   [ ]  asthma   [ ]  wheezing NEUROLOGIC:   [ ]  weakness  [ ]  paresthesias  [ ]  aphasia  [ ]  amaurosis  [ ]  dizziness HEMATOLOGIC:   [ ]  bleeding problems   [ ]  clotting disorders MUSCULOSKELETAL:  [ ]  joint pain   [ ]  joint swelling [ ]  leg swelling GASTROINTESTINAL: [ ]   blood in stool  [ ]   hematemesis GENITOURINARY:  [ ]   dysuria  [ ]   hematuria PSYCHIATRIC:  [ ]  history of major depression INTEGUMENTARY:  [ ]  rashes  [ ]  ulcers CONSTITUTIONAL:  [ ]  fever   [ ]  chills  PHYSICAL EXAM: Filed Vitals:   04/15/12 0400 04/15/12 0501 04/15/12 0610 04/15/12 0800  BP:   92/59 102/54  Pulse:   111 113  Temp:   98.3 F (36.8 C) 98.9 F (37.2 C)  TempSrc:   Oral Oral  Resp: 14  14 20   Height:      Weight:  104 lb 1.6 oz (47.219 kg) 102 lb 15.3 oz (46.7 kg)   SpO2: 98%  100% 97%   Body mass index is 14.36 kg/(m^2). GENERAL: The patient is a well-nourished male, in no acute distress. The vital signs are documented above. CARDIOVASCULAR: There is a regular rate and rhythm without significant murmur appreciated.  PULMONARY: There is good air exchange bilaterally without wheezing or rales. ABDOMEN: 10 from recent surgery. The left thigh AV graft does not have a bruit or thrill.  DATA:  Have reviewed the recent fistulogram from February which shows no problems with the body of the graft  although maybe some slight narrowing near the arterial end of the graft. Is no venous outflow obstruction.  Lab Results  Component Value Date    WBC 16.0* 04/15/2012   HGB 14.1 04/15/2012   HCT 43.6 04/15/2012   MCV 87.0 04/15/2012   PLT 159 04/15/2012   Lab Results  Component Value Date   NA 140 04/14/2012   K 5.4* 04/14/2012   CL 96 04/14/2012   CO2 22 04/14/2012   Lab Results  Component Value Date   CREATININE 10.28* 04/14/2012    MEDICAL ISSUES: Plan thrombectomy of left thigh AV graft. Discussed the procedure with patient.    DICKSON,CHRISTOPHER S [facility] [place] Beeper: [phone]

## 2012-04-15 NOTE — Consult Note (Signed)
Name: [name] MRN: [id number] DOB: 1960-10-05    LOS: 13  Referring Provider:  Larey Brick Reason for Referral:  Vent management  PULMONARY / CRITICAL CARE MEDICINE  HPI:  This is a 51 y/o male with ESRD who was admitted on 04/03/12 with a small bowel obstruction.  He was managed conservatively for several days but eventually underwent an ex-lap on 04/14/12 for lysis of adhesions and resection of 70cm of ileum and 5cm of jejunum.  Unfortunately he developed a clot in his L femoral AV graft and underwent a thrombectomy in the OR today.  Afterwards he was hypotensive so a L IJ CVL was placed for pressors and for TNA administration.  The CVL went into the L cephalic vein.  PCCM was consulted for vent management.  Past Medical History  Diagnosis Date  . Dialysis patient   . Prostate cancer    Past Surgical History  Procedure Date  . Laparotomy 04/14/2012    Procedure: EXPLORATORY LAPAROTOMY;  Surgeon: Clovis Pu. Cornett, MD;  Location: [facility] OR;  Service: General;  Laterality: N/A;  . Colon resection 04/14/2012    Procedure: COLON RESECTION;  Surgeon: Clovis Pu. Cornett, MD;  Location: [facility] OR;  Service: General;  Laterality: N/A;   Prior to Admission medications   Medication Sig Start Date End Date Taking? Authorizing Provider  colchicine 0.6 MG tablet Take 0.6 mg by mouth daily.   Yes Historical Provider, MD  lanthanum (FOSRENOL) 1000 MG chewable tablet Chew 2,000 mg by mouth 3 (three) times daily with meals.   Yes Historical Provider, MD  multivitamin (RENA-VIT) TABS tablet Take 1 tablet by mouth daily.   Yes Historical Provider, MD  predniSONE (DELTASONE) 5 MG tablet Take 5 mg by mouth daily.   Yes Historical Provider, MD   Allergies Allergies  Allergen Reactions  . Allopurinol     REACTION: decreased platelets  . Aspirin     REACTION: unspecified    Family History Family History  Problem Relation Age of Onset  . Hypertension Father   . Pneumonia Brother   . Lung disease  Brother    Social History  reports that he has never smoked. He does not have any smokeless tobacco history on file. He reports that he does not drink alcohol or use illicit drugs.  Review Of Systems:  Cannot obtain due to intubation  Brief patient description:  51 y/o male with ESRD who was admitted on 7/13 with SBO, underwent lysis of adhesions and partial small bowel resection x2 on 7/24 and later developed a clot in his L femoral AV graft requiring thrombectomy on 7/25.  PCCM consulted for vent management.  Events Since Admission: 7/13 admission 7/13 CT Abdomen with SBO 7/24 ex-lap, lysis of adhesions, partial small bowel resection x2 7/25 thrombectomy L femoral AV graft  Current Status:  Vital Signs: Temp:  [96.8 F (36 C)-98.9 F (37.2 C)] 98 F (36.7 C) (07/25 1949) Pulse Rate:  [79-125] 125  (07/25 1900) Resp:  [8-20] 12  (07/25 1900) BP: (92-143)/(54-84) 131/60 mmHg (07/25 1900) SpO2:  [92 %-100 %] 100 % (07/25 1900) FiO2 (%):  [50 %-100 %] 50 % (07/25 1900) Weight:  [46.7 kg (102 lb 15.3 oz)-47.219 kg (104 lb 1.6 oz)] 46.7 kg (102 lb 15.3 oz) (07/25 0610)  Physical Examination: Gen: somnolent on vent, arouses to voice HEENT: NCAT, EOMi, ETT in place, NG in place PULM: CTA B CV: RRR, loud systolic murmur at apex, no JVD AB: BS absent, soft, nontender,  no hsm Ext: cool feet, R DP 2+, L DP detected by doppler, no edema, no clubbing, no cyanosis Derm: no rash or skin breakdown Neuro: Somnolent but arouses, follows simple commands   Principal Problem:  *SBO (small bowel obstruction) s/p EL/LOA and SBR x 2 Active Problems:  CKD (chronic kidney disease) stage V requiring chronic dialysis  Erythrocytosis  Thrombocytopenia  Gout  HTN (hypertension)  Ileus following gastrointestinal surgery  Chronic use of steroids  Malnutrition, calorie  Leg graft occlusion  Acute respiratory failure with hypoxia  Sinus tachycardia   ASSESSMENT AND PLAN  PULMONARY No  results found for this basename: PHART:5,PCO2:5,PCO2ART:5,PO2ART:5,HCO3:5,O2SAT:5 in the last 168 hours Ventilator Settings: Vent Mode:  [-] SIMV/PC/PS FiO2 (%):  [50 %-100 %] 50 % Set Rate:  [12 bmp] 12 bmp Vt Set:  [550 mL] 550 mL PEEP:  [5 cmH20] 5 cmH20 Pressure Support:  [10 cmH20] 10 cmH20 Plateau Pressure:  [14 cmH20] 14 cmH20 CXR:  Emphysema, ETT in place, L IJ goes to cephalic vein ETT:  7/25  A:   Post operative respiratory failure, failed SBT 7/25 at 2020;  COPD? Emphysema on CXR P:   -full vent support overnight -change to PRVC, 8cc/kg -abg x1 -WUA/SBT in AM -add bronchodilator (albuterol)  CARDIOVASCULAR No results found for this basename: TROPONINI:5,LATICACIDVEN:5, O2SATVEN:5,PROBNP:5 in the last 168 hours ECG:  Sinus rhythm on tele Lines: 7/25 L IJ CVL (placed in PACU) terminates in L cephalic vein  A:  Shock post operatively, likely due to sedation now resolved, neo off L IJ CVL in L cephalic vein; 04/2011 venogram confirms central vein occlusion from both R and L L femoral vein thrombus, s/p thrombectomy 7/25 P:  -wean/d/c stress dose steroids 7/26 -no good CVL option from IJ or subclavian veins bilaterally -would not administer TNA through a femoral vein catheter -consider IR consult for PICC or fluoro guided CVL placement on 7/26 -d/c L IJ CVL after extubation; OK to use overnight for blood draws, meds -check 12 lead considering mild hyperkalemia on 7/24 -vascular following for thrombectomy  RENAL  Lab 04/14/12 1445 04/13/12 0650 04/12/12 0620 04/10/12 0659  NA 140 137 136 140  K 5.4* 4.9 -- --  CL 96 87* 90* 94*  CO2 22 24 22 20   BUN 50* 79* 60* 84*  CREATININE 10.28* 14.26* 11.38* 11.92*  CALCIUM 8.6 9.6 9.8 9.8  MG -- -- -- --  PHOS -- 11.1* -- 12.0*   Intake/Output      07/25 0701 - 07/26 0700   I.V. (mL/kg) 1020 (21.8)   NG/GT 30   IV Piggyback 750   Total Intake(mL/kg) 1800 (38.5)   Emesis/NG output    Other    Blood 50   Total  Output 50   Net +1750        Foley:    A:  ESRD, s/p thrombectomy 7/25 L fem AV graft P:   -per renal -bmet now  GASTROINTESTINAL  Lab 04/13/12 0650 04/10/12 0659  AST -- --  ALT -- --  ALKPHOS -- --  BILITOT -- --  PROT -- --  ALBUMIN 3.4* 3.2*    A:  SBO, s/p lysis of adhesions and partial small bowel resection 7/24; post op ileus P:   -cannot give TNA through current CVL -see discussion of line above -per surgery  HEMATOLOGIC  Lab 04/15/12 0500 04/14/12 1445 04/13/12 0650 04/12/12 0620 04/10/12 0659  HGB 14.1 14.0 15.1 15.4 15.1  HCT 43.6 42.8 44.0 45.7 43.9  PLT  159 133* 151 123* 139*  INR -- -- -- -- --  APTT -- -- -- -- --   A:  No acute issues P:  -check post op cbc x1  INFECTIOUS  Lab 04/15/12 0500 04/14/12 1445 04/13/12 0650 04/12/12 0620 04/10/12 0659  WBC 16.0* 12.8* 8.0 7.9 7.2  PROCALCITON -- -- -- -- --   Cultures:  Antibiotics:   A:  WBC up post operatively, no evidence of infection P:   -monitor fever curve  ENDOCRINE No results found for this basename: GLUCAP:5 in the last 168 hours A:  Stress dose steroids 7/25 due to chronic steroid use (presumably due to prior kidney transplant) P:   -wean 7/26  NEUROLOGIC  A:  Post op pain/comfort P:   -failed SBT 7/25 PM by me due to oversedation -d/c other narcotics while on vent -intermittent sedation protocol with fentanyl for pain, titrate to RASS 0  BEST PRACTICE / DISPOSITION Level of Care:  ICU Primary Service:  PCCM --> TRH after leaves unit Consultants:  Vascular surgery, CCS Code Status:  Full Diet:  npo DVT Px:  scd GI Px:  pepcid Skin Integrity:  No issues Social / Family:  Not available 7/25  CC time 45 minutes  Patty Lopezgarcia, M.D. Pulmonary and Critical Care Medicine [facility] Pager: [phone]  04/15/2012, 7:57 PM

## 2012-04-15 NOTE — Progress Notes (Signed)
51 y/o male with ESRD and clotted L. Thigh AVGG. He underwent thrombectomy and revision of graft by Dr. Durwin Nora. A L. Internal jugular 3 lumen central line was inserted in the OR following the surgery.   Post-op CXR shows line in L. Subclavian vein. I suspect that the SVC is occluded. Will leave catheter in place and discuss with Dr. Durwin Nora.  [name] underwent exploratory laparotomy foris of adhesions for SBO yesterday. He was lethargic in the OR Holding Area today but responded appropriately to questions and appeared stable.  [name] is now in the PACU, he remains sleepy but arouses with stimulation and follows commands. When he is not stimulated the respiratory rate drops to 4-6 bpm.  Plan to send him to 2300 intubated with CCM consult.

## 2012-04-15 NOTE — Anesthesia Procedure Notes (Signed)
Procedure Name: Intubation Date/Time: 04/15/2012 2:00 PM Performed by: Ellin Goodie Pre-anesthesia Checklist: Patient identified, Emergency Drugs available, Suction available, Patient being monitored and Timeout performed Patient Re-evaluated:Patient Re-evaluated prior to inductionOxygen Delivery Method: Circle system utilized Preoxygenation: Pre-oxygenation with 100% oxygen Intubation Type: IV induction, Cricoid Pressure applied and Rapid sequence Laryngoscope Size: Mac and 3 Grade View: Grade I Tube type: Oral Tube size: 7.5 mm Number of attempts: 1 Airway Equipment and Method: Stylet Placement Confirmation: ETT inserted through vocal cords under direct vision,  positive ETCO2 and breath sounds checked- equal and bilateral Secured at: 23 cm Tube secured with: Tape Dental Injury: Teeth and Oropharynx as per pre-operative assessment

## 2012-04-15 NOTE — Progress Notes (Signed)
Nutrition Follow-up/consult  Intervention:   1. Recommend initiation of TPN  2. Pt remains at risk for refeeding, Monitor magnesium, potassium, and phosphorus daily for at least 3 days, MD to replete as needed, as pt is at risk for refeeding syndrome given severe malnutrition. 3. RD will continue to follow     Assessment:   Ex Lap completed on 7/24, lysis of adhesions and small bowel resection x2 per op notes. Pt remains NPO now with post op ileus. Pt has been NPO or CL for most of admission, some trials of full liquids but not well tolerated.  Pt with malnutrition, at risk for refeeding once diet or nutrition support started.  RD spoke with pt on the 23rd, at that time pt did not want any nutrition supplements or snacks set up.  Pt with severe malnutrition, please see initial note from 7/18 for full details.    Diet Order:  NPO  Meds: Scheduled Meds:   . diphenhydrAMINE  12.5 mg Intravenous Once  . hydrocortisone sod succinate (SOLU-CORTEF) injection  50 mg Intravenous Q12H  . HYDROmorphone      . HYDROmorphone      . morphine   Intravenous Q4H  . morphine      . DISCONTD: amLODipine  5 mg Oral Daily  . DISCONTD: amLODipine  5 mg Oral QHS  . DISCONTD: lanthanum  2,000 mg Oral TID WC  . DISCONTD: polyethylene glycol  17 g Oral BID  . DISCONTD: predniSONE  5 mg Oral Q breakfast   Continuous Infusions:   . sodium chloride 10 mL/hr (04/14/12 1600)  . DISCONTD: dextrose 5 % and 0.9% NaCl     PRN Meds:.acetaminophen, acetaminophen, diphenhydrAMINE, diphenhydrAMINE, hydrALAZINE, morphine injection, naloxone, ondansetron (ZOFRAN) IV, ondansetron, phenol, promethazine, sodium chloride, DISCONTD: droperidol, DISCONTD:  HYDROmorphone (DILAUDID) injection, DISCONTD: ondansetron (ZOFRAN) IV  Labs:  CMP     Component Value Date/Time   NA 140 04/14/2012 1445   K 5.4* 04/14/2012 1445   CL 96 04/14/2012 1445   CO2 22 04/14/2012 1445   GLUCOSE 83 04/14/2012 1445   BUN 50* 04/14/2012 1445   CREATININE 10.28* 04/14/2012 1445   CALCIUM 8.6 04/14/2012 1445   PROT 8.0 04/02/2012 1918   ALBUMIN 3.4* 04/13/2012 0650   AST 35 04/02/2012 1918   ALT 23 04/02/2012 1918   ALKPHOS 59 04/02/2012 1918   BILITOT 0.5 04/02/2012 1918   GFRNONAA 5* 04/14/2012 1445   GFRAA 6* 04/14/2012 1445     Intake/Output Summary (Last 24 hours) at 04/15/12 1216 Last data filed at 04/15/12 0900  Gross per 24 hour  Intake  220.5 ml  Output    400 ml  Net -179.5 ml    Weight Status:  102 lbs, weight variable with dialysis though overall trending down   Re-estimated needs:  2000-2200 kcal, 70-80 gm protein  Nutrition Dx:  Inadequate oral intake now R/T inability to eat AEB NPO diet order with ileus   Goal:  Initiation of TPN  Monitor:  TPN, weight, labs, diet advance   Clarene Duke RD, LDN Pager [phone] After Hours pager [phone]

## 2012-04-15 NOTE — Progress Notes (Signed)
Dr Vassie Loll made aware of ABG results. No new orders at this time. Will continue to monitor.

## 2012-04-15 NOTE — Progress Notes (Signed)
Impression/Plan  1. SBO- s/p exp lap with small bowel resection x 2  2. ESRD - HD TTS at [facility]; HD tomorrow; clotted AVG in groin, needs thrombectomy.  Await call back from IR, if they will not do will ask for surgical TBX.  He looks dry.  Keep even with HD. 3. Hypertension/volume -   Subjective: Interval History: Abd feels better post op.  Objective: Vital signs in last 24 hours: Temp:  [96.8 F (36 C)-98.7 F (37.1 C)] 98.3 F (36.8 C) (07/25 0610) Pulse Rate:  [77-120] 111  (07/25 0610) Resp:  [9-25] 14  (07/25 0610) BP: (92-164)/(59-86) 92/59 mmHg (07/25 0610) SpO2:  [95 %-100 %] 100 % (07/25 0610) Weight:  [46.7 kg (102 lb 15.3 oz)-50.3 kg (110 lb 14.3 oz)] 46.7 kg (102 lb 15.3 oz) (07/25 0610) Weight change: 2.9 kg (6 lb 6.3 oz)  Intake/Output from previous day: 07/24 0701 - 07/25 0700 In: 2420.5 [I.V.:1670.5; IV Piggyback:750] Out: 1150 [Emesis/NG output:400; Blood:350] Intake/Output this shift:    General appearance: alert and cooperative Chest wall: no tenderness Lungs clear Abd post op Ext clotted l groin AVG, No edema  Lab Results:  Basename 04/15/12 0500 04/14/12 1445  WBC 16.0* 12.8*  HGB 14.1 14.0  HCT 43.6 42.8  PLT 159 133*   BMET:  Basename 04/14/12 1445 04-May-2012 0650  NA 140 137  K 5.4* 4.9  CL 96 87*  CO2 22 24  GLUCOSE 83 79  BUN 50* 79*  CREATININE 10.28* 14.26*  CALCIUM 8.6 9.6   No results found for this basename: PTH:2 in the last 72 hours Iron Studies: No results found for this basename: IRON,TIBC,TRANSFERRIN,FERRITIN in the last 72 hours Studies/Results: Dg Abd 2 Views  May 04, 2012  *RADIOLOGY REPORT*  Clinical Data: Abdominal pain, weakness.  ABDOMEN - 2 VIEW  Comparison: Abdominal radiograph 04/12/2012.  Findings: There is some gas, stool and residual oral contrast material noted within the colon.  There is a paucity of distal colonic and rectal gas. Visualized portions of the colon do not appear dilated.  Several dilated loops of  small bowel are seen in the upper abdomen, measuring up to 4.2 cm in diameter.  No gross evidence of pneumoperitoneum is noted on this limited supine view of the abdomen.  Multiple surgical clips are seen throughout the pelvis.  Numerous vascular calcifications are noted.  IMPRESSION: 1.  The bowel gas pattern is again most consistent with a partial small-bowel obstruction, as above.  The appearance is very similar to the recent prior examination.  Original Report Authenticated By: Florencia Reasons, M.D.    Scheduled:   . amLODipine  5 mg Oral QHS  . diphenhydrAMINE  12.5 mg Intravenous Once  . ertapenem  1 g Intravenous To OR  . HYDROmorphone      . HYDROmorphone      . lanthanum  2,000 mg Oral TID WC  . morphine   Intravenous Q4H  . morphine      . predniSONE  5 mg Oral Q breakfast  . DISCONTD: amLODipine  5 mg Oral Daily  . DISCONTD: ertapenem  1 g Intravenous 60 min Pre-Op  . DISCONTD: polyethylene glycol  17 g Oral BID      LOS: 13 days   Carlisle Torgeson C 04/15/2012,7:29 AM

## 2012-04-15 NOTE — Op Note (Signed)
NAME: [name]   MRN: [id number] DOB: 09-Jul-1961    DATE OF OPERATION: 04/15/2012  PREOP DIAGNOSIS: clotted left thigh AV graft  POSTOP DIAGNOSIS: same  PROCEDURE: thrombectomy left thigh AV graft with revision  SURGEON: Di Kindle. Edilia Bo, MD, FACS  ASSIST: Della Goo PA  ANESTHESIA: Gen.   EBL: minimal  INDICATIONS: [name] is a 51 y.o. male who presented with a bowel obstruction and underwent exploratory laparotomy, lysis of adhesions, and small bowel resection. The following day he was noted have a clotted left thigh AV graft. These brought for thrombectomy.  FINDINGS: the venous anastomosis was end to side to the femoral vein. The arterial anastomosis was directly inspected and was patent.  TECHNIQUE: The patient was brought to the operating room and received a general anesthetic. He did have an episode of supraventricular tachycardia. This was controlled after a Valsalva maneuver. The left thigh and groin were prepped and draped in usual sterile fashion. Longitudinal incision was made over the arterial and venous anastomoses. The venous limb of the graft was dissected free. Venous anastomosis was end to side to the femoral vein. I mobilized the femoral vein proximally and distally to the anastomosis such that a Satinsky clamp could be placed encompassing the anastomosis. The venous limb of the graft was divided. Graft thrombectomy was achieved using a #4 Fogarty catheter. There were no problems in pulling the catheter through the body of the graft. I was able to pass the catheter through the arterial anastomosis however. I therefore elected to explore this also. He arterial limb of the graft and anastomosis were dissected free. I dissected free the femoral artery proximal and distal to the anastomosis. Distal to the anastomosis the artery was markedly calcified. I elected to control this using a vessel loop. The arterial limb of the graft was divided. The arterial plug  was directly retrieved. The arterial anastomosis was widely patent. Graft thrombectomy was achieved from both ends with no residual clot remaining. The arterial limb of the graft was sewn back into in with continuous 6-0 Prolene suture. At the venous end, the Satinsky clamp was placed encompassing the old venous anastomosis. The venous limb of the graft was removed from the vein and the venotomy extended. A 6 mm graft was brought to the field and widely spatulated. It was sewn end-to-side to the femoral vein using continuous 6-0 Prolene suture. The new segment of graft was then spatulated and sewn end-to-end to the old segment of graft which was also spatulated. At the completion was an excellent thrill in the graft. It was difficult to determine if the dissection around the femoral artery was partially subadventitial. I therefore closed some fibrous tissue around the artery to be sure. The wounds and closed the deep layer of 3-0 Vicryl, a subcutaneous layer of 3-0 Vicryl, and the skin was closed with a 4-0 subcuticular stitch. The bone was applied. The patient tolerated the procedure well and was transferred to the recovery room in stable condition. All needle and sponge counts were correct.  Waverly Ferrari, MD, FACS [facility]  DATE OF DICTATION:   04/15/2012

## 2012-04-15 NOTE — Progress Notes (Addendum)
PARENTERAL NUTRITION CONSULT NOTE - INITIAL  Pharmacy Consult for TPN Indication: Ileus, Malnutrition at baseline  Allergies  Allergen Reactions  . Allopurinol     REACTION: decreased platelets  . Aspirin     REACTION: unspecified    Patient Measurements: Height: 5\' 11"  (180.3 cm) Weight: 102 lb 15.3 oz (46.7 kg) IBW/kg (Calculated) : 75.3  Usual Weight: 52Kg  Vital Signs: Temp: 98.9 F (37.2 C) (07/25 0800) Temp src: Oral (07/25 0800) BP: 102/54 mmHg (07/25 0800) Pulse Rate: 113  (07/25 0800) Intake/Output from previous day: 07/24 0701 - 07/25 0700 In: 2420.5 [I.V.:1670.5; IV Piggyback:750] Out: 1150 [Emesis/NG output:400; Blood:350] Intake/Output from this shift: Total I/O In: 50 [I.V.:20; NG/GT:30] Out: -   Labs:  Basename 04/15/12 0500 04/14/12 1445 04/13/12 0650  WBC 16.0* 12.8* 8.0  HGB 14.1 14.0 15.1  HCT 43.6 42.8 44.0  PLT 159 133* 151  APTT -- -- --  INR -- -- --     Basename 04/14/12 1445 04/13/12 0650  NA 140 137  K 5.4* 4.9  CL 96 87*  CO2 22 24  GLUCOSE 83 79  BUN 50* 79*  CREATININE 10.28* 14.26*  LABCREA -- --  CREAT24HRUR -- --  CALCIUM 8.6 9.6  MG -- --  PHOS -- 11.1*  PROT -- --  ALBUMIN -- 3.4*  AST -- --  ALT -- --  ALKPHOS -- --  BILITOT -- --  BILIDIR -- --  IBILI -- --  PREALBUMIN -- --  TRIG -- --  CHOLHDL -- --  CHOL -- --   Estimated Creatinine Clearance: 5.6 ml/min (by C-G formula based on Cr of 10.28).   No results found for this basename: GLUCAP:3 in the last 72 hours  Medical History: Past Medical History  Diagnosis Date  . Dialysis patient   . Prostate cancer     Medications:  Prescriptions prior to admission  Medication Sig Dispense Refill  . colchicine 0.6 MG tablet Take 0.6 mg by mouth daily.      Marland Kitchen lanthanum (FOSRENOL) 1000 MG chewable tablet Chew 2,000 mg by mouth 3 (three) times daily with meals.      . multivitamin (RENA-VIT) TABS tablet Take 1 tablet by mouth daily.      . predniSONE  (DELTASONE) 5 MG tablet Take 5 mg by mouth daily.        Insulin Requirements in the past 24 hours:  No SSI on board, no hx DM  Current Nutrition:  NPO  Assessment: [name] was admitted 7/13 with abd pain, nausea/vomiting and 5 Kg weight loss in 6 days. Patient was made NPO for abd CT that was done on admission. Patient initially had an NGT for decompression, which was then taken out when he tolerated clamping (7/19). He was then started on a clear liquid diet, which could not be advanced d/t intermittent cramping. Pt ultimately taken to OR for exp lap, LOA and SBR of chronic SBO (7/24).  Noted, appreciate RD recs for goal KCal/protein. Noted high refeeding risk d/t recent weight loss and prolonged NPO status.  GI - NGT in place, noted 400cc out last 24h, currently NPO.   Endo - Blood glucose controlled on AM labs, no prior hx of DM  Lytes/Renal - ESRD; patient on HD TTS; awaiting thrombectomy vs revision of L thigh graft (possibly today). Phos and K elevated, noted plans for HD tomorrow.  Hepatobil - Baseline LFTs WNL on 7/12.  Pulm/Neuro - 2L [place]. Noted IV opiates for post-op pain  control.  Cardiology - BP low nml, HR 100s; on prn hydralazine  ID Afeb, WBC incr- likely reactive post-op. No abx on board  Px - No VTE px on board, will add Famotidine to help decrease NGT output    Access:  - Pending PICC/central line placement  Nutritional Goals:  2000-2200 kCal, 70-80 grams of protein per day  Plan:  - Once PICC is placed, will start TPN with Clinimix 5/15 at 40cc/hr- goal is 60 cc/hr to provide avg of 1100 Kcal and 72gm protein/day. D/w RD that our ability to advance calories is limited by the fact that we can only give this HD patient so much protein with i-HD. - Will monitor K, Mg and Phos closely (daily for the first three days on TPN) d/t high refeeding risk. - Will supplement MVI, trace elements and IV lipids on MWF only d/t Sport and exercise psychologist. - Will f/up TPN labs in AM -  Will start q4h CBG checks for now to assess glucose control with onset of TPN- no SSI d/t no hx of DM- will d/c if deem unnecessary.  Trevione Wert K. Allena Katz, PharmD, BCPS.  Clinical Pharmacist Pager [phone]. 04/15/2012 1:41 PM    Spoke with Chip Boer- IV team RN- patient will not be getting PICC as he is an ESRD pt with poor access.  Spoke with Dr. Butler Denmark to suggest she requests that the vascular surgeon puts in central access as patient is currently in the OR Left treatment team sticky note, verbal communication with Teresa,RN and Pharmacy instructions to nursing to only start TPN if patient has central access at 6pm today.  Colbie Sliker K. Allena Katz, PharmD, BCPS.  Clinical Pharmacist Pager [phone]. 04/15/2012 2:39 PM

## 2012-04-15 NOTE — Progress Notes (Signed)
Looks ok.  Wound ok.  Start wound care. Nutrition will be an issue.  Ileus currently.

## 2012-04-16 ENCOUNTER — Inpatient Hospital Stay (HOSPITAL_COMMUNITY): Payer: Medicare Other

## 2012-04-16 DIAGNOSIS — E46 Unspecified protein-calorie malnutrition: Secondary | ICD-10-CM

## 2012-04-16 LAB — COMPREHENSIVE METABOLIC PANEL
ALT: 10 U/L (ref 0–53)
AST: 26 U/L (ref 0–37)
Albumin: 2.9 g/dL — ABNORMAL LOW (ref 3.5–5.2)
Alkaline Phosphatase: 39 U/L (ref 39–117)
Calcium: 8.8 mg/dL (ref 8.4–10.5)
GFR calc Af Amer: 20 mL/min — ABNORMAL LOW (ref >90)
Potassium: 3.9 mEq/L (ref 3.5–5.1)
Sodium: 140 mEq/L (ref 135–145)
Total Protein: 5.6 g/dL — ABNORMAL LOW (ref 6.0–8.3)

## 2012-04-16 LAB — CBC
Hemoglobin: 10.6 g/dL — ABNORMAL LOW (ref 13.0–17.0)
MCH: 28.9 pg (ref 26.0–34.0)
Platelets: 93 10*3/uL — ABNORMAL LOW (ref 150–400)
RBC: 3.67 MIL/uL — ABNORMAL LOW (ref 4.22–5.81)
WBC: 8.8 10*3/uL (ref 4.0–10.5)

## 2012-04-16 LAB — GLUCOSE, CAPILLARY
Glucose-Capillary: 103 mg/dL — ABNORMAL HIGH (ref 70–99)
Glucose-Capillary: 112 mg/dL — ABNORMAL HIGH (ref 70–99)
Glucose-Capillary: 63 mg/dL — ABNORMAL LOW (ref 70–99)
Glucose-Capillary: 74 mg/dL (ref 70–99)
Glucose-Capillary: 95 mg/dL (ref 70–99)
Glucose-Capillary: 96 mg/dL (ref 70–99)

## 2012-04-16 LAB — CHOLESTEROL, TOTAL: Cholesterol: 84 mg/dL (ref 0–200)

## 2012-04-16 LAB — PHOSPHORUS: Phosphorus: 4.3 mg/dL (ref 2.3–4.6)

## 2012-04-16 LAB — DIFFERENTIAL
Basophils Relative: 0 % (ref 0–1)
Eosinophils Absolute: 0.1 10*3/uL (ref 0.0–0.7)
Lymphs Abs: 0.4 10*3/uL — ABNORMAL LOW (ref 0.7–4.0)
Monocytes Relative: 12 % (ref 3–12)
Neutro Abs: 7.3 10*3/uL (ref 1.7–7.7)
Neutrophils Relative %: 83 % — ABNORMAL HIGH (ref 43–77)

## 2012-04-16 LAB — TRIGLYCERIDES: Triglycerides: 117 mg/dL (ref <150)

## 2012-04-16 MED ORDER — ALBUTEROL SULFATE (5 MG/ML) 0.5% IN NEBU: 2.5000 mg | INHALATION_SOLUTION | RESPIRATORY_TRACT | Status: DC | PRN

## 2012-04-16 MED ORDER — IPRATROPIUM BROMIDE 0.02 % IN SOLN: 0.5000 mg | RESPIRATORY_TRACT | Status: DC | PRN

## 2012-04-16 MED ORDER — ALBUTEROL SULFATE (5 MG/ML) 0.5% IN NEBU
2.5000 mg | INHALATION_SOLUTION | RESPIRATORY_TRACT | Status: DC
  Administered 2012-04-16 (×3): 2.5 mg via RESPIRATORY_TRACT
  Filled 2012-04-16 (×4): qty 0.5

## 2012-04-16 MED ORDER — MORPHINE SULFATE 2 MG/ML IJ SOLN
2.0000 mg | INTRAMUSCULAR | Status: DC | PRN
  Administered 2012-04-16 – 2012-04-19 (×10): 2 mg via INTRAVENOUS
  Administered 2012-04-19: 4 mg via INTRAVENOUS
  Administered 2012-04-19 – 2012-04-20 (×3): 2 mg via INTRAVENOUS
  Administered 2012-04-20: 4 mg via INTRAVENOUS
  Administered 2012-04-20 (×2): 2 mg via INTRAVENOUS
  Administered 2012-04-21 – 2012-04-22 (×5): 4 mg via INTRAVENOUS
  Filled 2012-04-16 (×4): qty 2
  Filled 2012-04-16 (×2): qty 1
  Filled 2012-04-16: qty 2
  Filled 2012-04-16 (×3): qty 1
  Filled 2012-04-16: qty 2
  Filled 2012-04-16: qty 1
  Filled 2012-04-16: qty 2
  Filled 2012-04-16 (×3): qty 1
  Filled 2012-04-16: qty 2
  Filled 2012-04-16: qty 1
  Filled 2012-04-16: qty 2
  Filled 2012-04-16 (×2): qty 1
  Filled 2012-04-16: qty 2
  Filled 2012-04-16 (×2): qty 1

## 2012-04-16 MED ORDER — DEXTROSE 50 % IV SOLN: 25.0000 mL | Freq: Once | INTRAVENOUS | Status: AC | PRN

## 2012-04-16 MED ORDER — DEXTROSE 50 % IV SOLN
INTRAVENOUS | Status: AC
  Administered 2012-04-16: 50 mL
  Filled 2012-04-16: qty 50

## 2012-04-16 MED ORDER — IPRATROPIUM BROMIDE 0.02 % IN SOLN
0.5000 mg | RESPIRATORY_TRACT | Status: DC
  Administered 2012-04-16 (×3): 0.5 mg via RESPIRATORY_TRACT
  Filled 2012-04-16 (×4): qty 2.5

## 2012-04-16 NOTE — Procedures (Signed)
Extubation Procedure Note  Patient Details:   Name: [name] DOB: 1960-11-25 MRN: [id number]   Airway Documentation:     Evaluation  O2 sats: stable throughout and currently acceptable Complications: No apparent complications Patient did tolerate procedure well. Bilateral Breath Sounds: Clear Suctioning: Airway Yes  Antoine Poche 04/16/2012, [phone]

## 2012-04-16 NOTE — Consult Note (Signed)
Agree with above. Will proceed with thrombectomy.  Di Kindle. Edilia Bo, MD, FACS Beeper [phone] 04/16/2012

## 2012-04-16 NOTE — Progress Notes (Addendum)
VVS progress note:  POD#1 thrombectomy and revision left thigh graft Graft was successfully used last pm  Good bruit in graft. Wound healing well  Call for any further assistance  Agree with above. Di Kindle. Edilia Bo, MD, FACS Beeper [phone] 04/16/2012

## 2012-04-16 NOTE — Progress Notes (Signed)
CBG: 63  Treatment: D50 IV 25 mL  Symptoms: None  Follow-up CBG: Time:0419 CBG Result:112  Possible Reasons for Event: Unknown  Comments/MD notified:n/a    Davina Poke

## 2012-04-16 NOTE — Progress Notes (Signed)
1 Day Post-Op  Subjective: On vent sleepy  Objective: Vital signs in last 24 hours: Temp:  [96.8 F (36 C)-98.7 F (37.1 C)] 98.2 F (36.8 C) (07/26 0750) Pulse Rate:  [79-125] 108  (07/26 0800) Resp:  [8-20] 11  (07/26 0800) BP: (115-164)/(54-84) 164/69 mmHg (07/26 0800) SpO2:  [92 %-100 %] 100 % (07/26 0809) FiO2 (%):  [30 %-100 %] 30 % (07/26 0809) Weight:  [102 lb 8.2 oz (46.5 kg)-102 lb 15.3 oz (46.7 kg)] 102 lb 8.2 oz (46.5 kg) (07/26 0332) Last BM Date: 04/12/12  Intake/Output from previous day: 07/25 0701 - 07/26 0700 In: 2050 [I.V.:1140; NG/GT:110; IV Piggyback:800] Out: 350 [Emesis/NG output:300; Blood:50] Intake/Output this shift: Total I/O In: 30 [NG/GT:30] Out: -   Incision/Wound:clean dry intact.  Skin edges packed  Lab Results:   Basename 04/16/12 0353 04/15/12 2018  WBC 8.8 11.4*  HGB 10.6* 10.5*  HCT 31.7* 32.1*  PLT 93* 123*   BMET  Basename 04/16/12 0353 04/15/12 2018  NA 140 144  K 3.9 6.9*  CL 98 103  CO2 29 21  GLUCOSE 73 83  BUN 15 70*  CREATININE 3.71* 12.07*  CALCIUM 8.8 8.3*   PT/INR No results found for this basename: LABPROT:2,INR:2 in the last 72 hours ABG  Basename 04/15/12 2204 04/15/12 2148  PHART 7.290* 7.284*  HCO3 21.6 21.8    Studies/Results: Dg Chest Port 1 View  04/15/2012  *RADIOLOGY REPORT*  Clinical Data: History of endotracheal tube placement.  History of line placement.  PORTABLE CHEST - 1 VIEW  Comparison: 04/07/2011.  Findings: Tip of endotracheal tube terminates 7 cm above the carina.  Enteric tube is in place with distal portion extending into the upper left abdomen.  Tip is not included on the image. Left internal jugular venous catheter tip terminates in left axillary region.  There is no evidence of a pneumothorax.  No pulmonary infiltrates are evident.  No pleural effusion is seen. Cardiac silhouette is borderline in size.  On the previous study there is a catheter entering from the inferior vena cava  approach. This catheter is no longer evident.  IMPRESSION: Endotracheal tube in place with tip above carina.  Enteric tube in place.  Left internal jugular venous catheter tip terminates in the left axillary region.  There is a note given in the history that Dr. Noreene Larsson was aware of the position of this catheter with no plan to reposition the catheter. No pneumothorax is evident.  Original Report Authenticated By: Crawford Givens, M.D.    Anti-infectives: Anti-infectives     Start     Dose/Rate Route Frequency Ordered Stop   04/14/12 0830   ertapenem (INVANZ) 1 g in sodium chloride 0.9 % 50 mL IVPB        1 g 100 mL/hr over 30 Minutes Intravenous To Surgery 04/14/12 0816 04/14/12 0834   04/13/12 1359   ertapenem (INVANZ) 1 g in sodium chloride 0.9 % 50 mL IVPB  Status:  Discontinued        1 g 100 mL/hr over 30 Minutes Intravenous 60 min pre-op 04/13/12 1359 04/14/12 0816          Assessment/Plan: s/p Procedure(s) (LRB): THROMBECTOMY AND REVISION OF ARTERIOVENTOUS (AV) GORETEX  GRAFT (Left) Ex lap SBR VDRF ESRD GRAFT THROMBOSIS ILEUS Will need TNA once IV access sorted out.  If extubated can try some clears. Not much out NGT.  LOS: 14 days    Adeoluwa Silvers A. 04/16/2012

## 2012-04-16 NOTE — Addendum Note (Signed)
Addendum  created 04/16/12 1624 by Kipp Brood, MD   Modules edited:Notes Section

## 2012-04-16 NOTE — Progress Notes (Addendum)
PARENTERAL NUTRITION CONSULT NOTE - Follow Up  Pharmacy Consult for TPN Indication: Ileus, Malnutrition at baseline  Assessment: Mr. Bargar was admitted 7/13 with abd pain, nausea/vomiting and 5 Kg weight loss in 6 days. Patient was made NPO for abd CT that was done on admission. Patient initially had an NGT for decompression, which was then taken out when he tolerated clamping (7/19). He was then started on a clear liquid diet, which could not be advanced d/t intermittent cramping. Pt ultimately taken to OR for exp lap, LOA and SBR of chronic SBO (7/24).  He remains NPO - with plans to resume nutrition once IV access or NG feeds tolerated.    Spoke with CCM (Dr. Marin Shutter)- he will need IR to place central access if TNA is to be started due to occlussions.  Spoke with Riki Rusk, NP for surgery, plan is to start clear liquids if patient is extubated and he will need swallow eval per Riki Rusk as well, so TNA will likely need to be started.  Nutritional Goals:  2000-2200 kCal, 70-80 grams of protein per day  Plan:   Will await decision regarding TNA vs. TF vs. oral diet.  Will discontinue TNA consult for now.  Please re-consult if TNA is to be started.  He is mal-nourished and it will take some time for him to "catch up" once nutritional support is started.  This likely will result in electrolyte abnormality.  Will discontinue TNA labs for now.  Nadara Mustard, PharmD., MS Clinical Pharmacist Pager:  [phone]  Thank you for allowing pharmacy to be part of this patients care team. - 04/16/2012 10:48 AM

## 2012-04-16 NOTE — Progress Notes (Addendum)
Impression/Plan  1. SBO- s/p exp lap with small bowel resection x 2  2. ESRD - HD TTS at [facility]; s/p Thrombectomy with post op hypotension requiring support with vent next HD on Saturday 3.   Hyperkalemia(resolved), s/p emergency HD yesterday 4.  ABLA, surgery related  (hgb 14.1g on 7/25, 10.5g post op)  Subjective: Interval History: above post op incident  Objective: Vital signs in last 24 hours: Temp:  [96.8 F (36 C)-98.7 F (37.1 C)] 98.2 F (36.8 C) (07/26 0750) Pulse Rate:  [79-125] 108  (07/26 0800) Resp:  [8-20] 11  (07/26 0800) BP: (115-164)/(54-84) 164/69 mmHg (07/26 0800) SpO2:  [92 %-100 %] 100 % (07/26 0809) FiO2 (%):  [30 %-100 %] 30 % (07/26 0809) Weight:  [46.5 kg (102 lb 8.2 oz)-46.7 kg (102 lb 15.3 oz)] 46.5 kg (102 lb 8.2 oz) (07/26 0332) Weight change: -3.6 kg (-7 lb 15 oz)  Intake/Output from previous day: 07/25 0701 - 07/26 0700 In: 2050 [I.V.:1140; NG/GT:110; IV Piggyback:800] Out: 350 [Emesis/NG output:300; Blood:50] Intake/Output this shift: Total I/O In: 30 [NG/GT:30] Out: -   Intubated orally Alert but sleepy Lungs clear Cor RRR Abd post op bandaged Extrem no edema  Lab Results:  [facility] 04/16/12 0353 04/15/12 2018  WBC 8.8 11.4*  HGB 10.6* 10.5*  HCT 31.7* 32.1*  PLT 93* 123*   BMET:  Basename 04/16/12 0353 04/15/12 2018  NA 140 144  K 3.9 6.9*  CL 98 103  CO2 29 21  GLUCOSE 73 83  BUN 15 70*  CREATININE 3.71* 12.07*  CALCIUM 8.8 8.3*   No results found for this basename: PTH:2 in the last 72 hours Iron Studies: No results found for this basename: IRON,TIBC,TRANSFERRIN,FERRITIN in the last 72 hours Studies/Results: Dg Chest Port 1 View  04/15/2012  *RADIOLOGY REPORT*  Clinical Data: History of endotracheal tube placement.  History of line placement.  PORTABLE CHEST - 1 VIEW  Comparison: 04/07/2011.  Findings: Tip of endotracheal tube terminates 7 cm above the carina.  Enteric tube is in place with distal portion extending into  the upper left abdomen.  Tip is not included on the image. Left internal jugular venous catheter tip terminates in left axillary region.  There is no evidence of a pneumothorax.  No pulmonary infiltrates are evident.  No pleural effusion is seen. Cardiac silhouette is borderline in size.  On the previous study there is a catheter entering from the inferior vena cava approach. This catheter is no longer evident.  IMPRESSION: Endotracheal tube in place with tip above carina.  Enteric tube in place.  Left internal jugular venous catheter tip terminates in the left axillary region.  There is a note given in the history that Dr. Noreene Larsson was aware of the position of this catheter with no plan to reposition the catheter. No pneumothorax is evident.  Original Report Authenticated By: Crawford Givens, M.D.   Scheduled:   . albuterol  8 puff Inhalation Q6H  . antiseptic oral rinse  15 mL Mouth Rinse q12n4p  . chlorhexidine  15 mL Mouth Rinse BID  . dextrose      . famotidine (PEPCID) IV  20 mg Intravenous Q12H  . hydrocortisone sod succinate (SOLU-CORTEF) injection  50 mg Intravenous Q12H  . DISCONTD: adenosine (ADENOCARD) IV  12 mg Intravenous Once  . DISCONTD: amLODipine  5 mg Oral QHS  . DISCONTD: diphenhydrAMINE  12.5 mg Intravenous Once  . DISCONTD: morphine   Intravenous Q4H  . DISCONTD: predniSONE  5 mg Oral  Q breakfast     LOS: 14 days   Tane Biegler C 04/16/2012,8:35 AM

## 2012-04-16 NOTE — Progress Notes (Signed)
Name: [name] MRN: [id number] DOB: 05/26/1961    LOS: 14  Referring Provider:  Larey Brick Reason for Referral:  Vent management  PULMONARY / CRITICAL CARE MEDICINE  Brief patient description:  51 y/o male with ESRD who was admitted on 7/13 with SBO, underwent lysis of adhesions and partial small bowel resection x2 on 7/24 and later developed a clot in his L femoral AV graft requiring thrombectomy on 7/25.  PCCM consulted for vent management.  Events Since Admission: 7/13  Admission 7/13  CT Abdomen with SBO 7/24  Ex-lap, lysis of adhesions, partial small bowel resection x2 7/25  Thrombectomy L femoral AV graft 7/26  Extubated, CVL is out  Current Status:  No issues overnight.  Off pressors.  SBT this AM.  Vital Signs: Temp:  [96.8 F (36 C)-98.7 F (37.1 C)] 98.2 F (36.8 C) (07/26 0750) Pulse Rate:  [79-125] 115  (07/26 0900) Resp:  [8-20] 13  (07/26 0900) BP: (115-164)/(54-84) 160/72 mmHg (07/26 0900) SpO2:  [92 %-100 %] 100 % (07/26 0900) FiO2 (%):  [30 %-100 %] 30 % (07/26 0900) Weight:  [46.5 kg (102 lb 8.2 oz)-46.7 kg (102 lb 15.3 oz)] 46.5 kg (102 lb 8.2 oz) (07/26 0332)  Physical Examination: Gen: Synchronous, no distress HEENT: PERRL PULM: Bilateral diminished air entry, no added breath sounds CV: RRR, loud systolic murmur at apex AB: BS absent, soft, nontender, no hsm Ext: cool feet, no edema, no clubbing, no cyanosis Derm: no rash or skin breakdown Neuro: awake, alert, nonverbal  Principal Problem:  *SBO (small bowel obstruction) s/p EL/LOA and SBR x 2 Active Problems:  CKD (chronic kidney disease) stage V requiring chronic dialysis  Erythrocytosis  Thrombocytopenia  Gout  HTN (hypertension)  Ileus following gastrointestinal surgery  Chronic use of steroids  Malnutrition, calorie  Leg graft occlusion  Acute respiratory failure with hypoxia  Sinus tachycardia  ASSESSMENT AND PLAN  PULMONARY  Lab 04/15/12 2204 04/15/12 2148  PHART 7.290*  7.284*  PCO2ART 44.8 45.7*  PO2ART 176.0* 36.0*  HCO3 21.6 21.8  O2SAT 99.0 63.0   Ventilator Settings: Vent Mode:  [-] PSV;CPAP FiO2 (%):  [30 %-100 %] 30 % Set Rate:  [12 bmp-15 bmp] 15 bmp Vt Set:  [470 mL-550 mL] 470 mL PEEP:  [5 cmH20] 5 cmH20 Pressure Support:  [5 cmH20-10 cmH20] 5 cmH20 Plateau Pressure:  [11 cmH20-14 cmH20] 11 cmH20  CXR:  7/25 >>> Emphysema, ETT in place, L IJ goes to cephalic vein ETT:  7/25 >>> 7/26  A:  Post operative respiratory failure, failed SBT 7/25 at 2020.  COPD? Emphysema on CXR. P:   Extubate Supplemental oxygen Bronchodilators  CARDIOVASCULAR No results found for this basename: TROPONINI:5,LATICACIDVEN:5, O2SATVEN:5,PROBNP:5 in the last 168 hours  ECG:  Sinus rhythm on tele Lines: 7/25 L IJ CVL (placed in PACU) terminates in L cephalic vein >>> 7/26  A: Shock post operatively, likely due to sedation now resolved, neo off. 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  L femoral vein thrombus, s/p thrombectomy 7/25. P:  Establish 2 PIVs D/c L IJ CVL, if has to have central access would consult IR Vascular following for thrombectomy  RENAL  Lab 04/16/12 0353 04/15/12 2018 04/14/12 1445 04/13/12 0650 04/12/12 0620 04/10/12 0659  NA 140 144 140 137 136 --  K 3.9 6.9* -- -- -- --  CL 98 103 96 87* 90* --  CO2 29 21 22 24 22  --  BUN 15 70* 50* 79* 60* --  CREATININE 3.71* 12.07* 10.28* 14.26* 11.38* --  CALCIUM 8.8 8.3* 8.6 9.6 9.8 --  MG 1.9 -- -- -- -- --  PHOS 4.3 -- -- 11.1* -- 12.0*   Intake/Output      07/25 0701 - 07/26 0700 07/26 0701 - 07/27 0700   I.V. (mL/kg) 1140 (24.5)    NG/GT 110 30   IV Piggyback 800    Total Intake(mL/kg) 2050 (44.1) 30 (0.6)   Emesis/NG output 300    Other 0    Blood 50    Total Output 350    Net +1700 +30         Foley:  Anuric  A:  ESRD, s/p thrombectomy 7/25 L fem AV graft.  Resolved hyperkalemia. P:   Per Renal Trend  BMET  GASTROINTESTINAL  Lab 04/16/12 0353 04/13/12 0650 04/10/12 0659  AST 26 -- --  ALT 10 -- --  ALKPHOS 39 -- --  BILITOT 1.5* -- --  PROT 5.6* -- --  ALBUMIN 2.9* 3.4* 3.2*   A:  SBO, s/p lysis of adhesions and partial small bowel resection 7/24; post op ileus. P:   Would consider using enteral feeds if ok with surgery If TNA is required would consult IR Per surgery  HEMATOLOGIC  Lab 04/16/12 0353 04/15/12 2018 04/15/12 0500 04/14/12 1445 04/13/12 0650  HGB 10.6* 10.5* 14.1 14.0 15.1  HCT 31.7* 32.1* 43.6 42.8 44.0  PLT 93* 123* 159 133* 151  INR -- -- -- -- --  APTT -- -- -- -- --   A:  No acute issues. P:  Trend CBC  INFECTIOUS  Lab 04/16/12 0353 04/15/12 2018 04/15/12 0500 04/14/12 1445 04/13/12 0650  WBC 8.8 11.4* 16.0* 12.8* 8.0  PROCALCITON -- -- -- -- --   Cultures:  NA  Antibiotics:  NA   A:  WBC up post operatively, no evidence of infection. P:   Trend CBC  ENDOCRINE  Lab 04/16/12 0752 04/16/12 0419 04/16/12 0356 04/16/12 0010 04/15/12 1947  GLUCAP 95 112* 63* 74 75   A:  Stress dose steroids 7/25 due to chronic steroid use (presumably due to prior kidney transplant), now hypertensive. P:   D/c Hydrocortisone  NEUROLOGIC  A:  Post op pain/comfort P:   D/c Fentanyl / Versed as expecting to extubate Add Morphine for post po pain control  BEST PRACTICE / DISPOSITION Level of Care:  ICU Primary Service:  PCCM --> TRH after leaves unit Consultants:  Vascular surgery, CCS Code Status:  Full Diet:  NPO DVT Px:  SCDs GI Px:  Pepcid Skin Integrity:  No issues Social / Family:  Not available during rounds  The patient is critically ill with multiple organ systems failure and requires high complexity decision making for assessment and support, frequent evaluation and titration of therapies, application of advanced monitoring technologies and extensive interpretation of multiple databases. Critical Care Time devoted to patient care services  described in this note is 35 minutes.   Lonia Farber, M.D. Pulmonary and Critical Care Medicine [facility]  04/16/2012, 9:39 AM

## 2012-04-16 NOTE — Progress Notes (Signed)
Anesthesiology Follow-up:  Awake, alert, having mild abdominal pain. Extubated at 09:46 today. Had hemodialysis last night.  VS: T-37.1 HR- 92 (SR) RR-13 BP-158/74 O2 Sat 99% on 4L  K-3.9 glucose 73 Na 140 H/H 10.6/31.7 Plts 93,000 WBC 8,800   Stable post-op course.  Kipp Brood, MD

## 2012-04-17 ENCOUNTER — Inpatient Hospital Stay (HOSPITAL_COMMUNITY): Payer: Medicare Other

## 2012-04-17 LAB — CBC
HCT: 29.1 % — ABNORMAL LOW (ref 39.0–52.0)
Hemoglobin: 9.6 g/dL — ABNORMAL LOW (ref 13.0–17.0)
MCH: 28 pg (ref 26.0–34.0)
RBC: 3.43 MIL/uL — ABNORMAL LOW (ref 4.22–5.81)

## 2012-04-17 LAB — RENAL FUNCTION PANEL
BUN: 59 mg/dL — ABNORMAL HIGH (ref 6–23)
CO2: 26 mEq/L (ref 19–32)
GFR calc Af Amer: 7 mL/min — ABNORMAL LOW (ref >90)
Glucose, Bld: 95 mg/dL (ref 70–99)
Potassium: 4.5 mEq/L (ref 3.5–5.1)
Sodium: 139 mEq/L (ref 135–145)

## 2012-04-17 LAB — GLUCOSE, CAPILLARY
Glucose-Capillary: 70 mg/dL (ref 70–99)
Glucose-Capillary: 78 mg/dL (ref 70–99)
Glucose-Capillary: 78 mg/dL (ref 70–99)

## 2012-04-17 MED ORDER — MORPHINE SULFATE 2 MG/ML IJ SOLN
INTRAMUSCULAR | Status: AC
  Filled 2012-04-17: qty 1

## 2012-04-17 MED ORDER — FAMOTIDINE 20 MG PO TABS
20.0000 mg | ORAL_TABLET | Freq: Every day | ORAL | Status: AC
  Administered 2012-04-17 – 2012-04-19 (×3): 20 mg via ORAL
  Filled 2012-04-17 (×3): qty 1

## 2012-04-17 MED ORDER — ALBUTEROL SULFATE (5 MG/ML) 0.5% IN NEBU: 2.5000 mg | INHALATION_SOLUTION | Freq: Three times a day (TID) | RESPIRATORY_TRACT | Status: DC

## 2012-04-17 MED ORDER — HEPARIN SODIUM (PORCINE) 1000 UNIT/ML DIALYSIS
20.0000 [IU]/kg | INTRAMUSCULAR | Status: DC | PRN
  Filled 2012-04-17: qty 1

## 2012-04-17 MED ORDER — IPRATROPIUM BROMIDE 0.02 % IN SOLN: 0.5000 mg | Freq: Three times a day (TID) | RESPIRATORY_TRACT | Status: DC

## 2012-04-17 NOTE — Progress Notes (Signed)
2 Days Post-Op  Subjective: Sore.  Objective: Vital signs in last 24 hours: Temp:  [98 F (36.7 C)-98.8 F (37.1 C)] 98.4 F (36.9 C) (07/27 0807) Pulse Rate:  [84-116] 90  (07/27 0700) Resp:  [8-25] 13  (07/27 0700) BP: (122-181)/(59-105) 145/105 mmHg (07/27 0600) SpO2:  [93 %-100 %] 98 % (07/27 0700) FiO2 (%):  [30 %] 30 % (07/26 0900) Weight:  [103 lb 2.8 oz (46.8 kg)] 103 lb 2.8 oz (46.8 kg) (07/27 0600) Last BM Date: 04/12/12  Intake/Output from previous day: 07/26 0701 - 07/27 0700 In: 220 [NG/GT:120; IV Piggyback:100] Out: 450 [Emesis/NG output:450] Intake/Output this shift:    Incision/Wound:clean dry intact.  Flat abdomen positive BS  Lab Results:   Basename 04/16/12 0353 04/15/12 2018  WBC 8.8 11.4*  HGB 10.6* 10.5*  HCT 31.7* 32.1*  PLT 93* 123*   BMET  Basename 04/16/12 0353 04/15/12 2018  NA 140 144  K 3.9 6.9*  CL 98 103  CO2 29 21  GLUCOSE 73 83  BUN 15 70*  CREATININE 3.71* 12.07*  CALCIUM 8.8 8.3*   PT/INR No results found for this basename: LABPROT:2,INR:2 in the last 72 hours ABG  Basename 04/15/12 2204 04/15/12 2148  PHART 7.290* 7.284*  HCO3 21.6 21.8    Studies/Results: Dg Chest Port 1 View  04/15/2012  *RADIOLOGY REPORT*  Clinical Data: History of endotracheal tube placement.  History of line placement.  PORTABLE CHEST - 1 VIEW  Comparison: 04/07/2011.  Findings: Tip of endotracheal tube terminates 7 cm above the carina.  Enteric tube is in place with distal portion extending into the upper left abdomen.  Tip is not included on the image. Left internal jugular venous catheter tip terminates in left axillary region.  There is no evidence of a pneumothorax.  No pulmonary infiltrates are evident.  No pleural effusion is seen. Cardiac silhouette is borderline in size.  On the previous study there is a catheter entering from the inferior vena cava approach. This catheter is no longer evident.  IMPRESSION: Endotracheal tube in place with tip  above carina.  Enteric tube in place.  Left internal jugular venous catheter tip terminates in the left axillary region.  There is a note given in the history that Dr. Noreene Larsson was aware of the position of this catheter with no plan to reposition the catheter. No pneumothorax is evident.  Original Report Authenticated By: Crawford Givens, M.D.   Dg Abd Portable 1v  04/16/2012  *RADIOLOGY REPORT*  Clinical Data: No bowel activity since exploratory laparoscopy 2 days ago question ileus, right lower quadrant pain  PORTABLE ABDOMEN - 1 VIEW  Comparison: 04/13/2012  Findings: Nasogastric tube in stomach. Retained contrast in colon. Surgical clips in pelvis bilaterally. Scattered atherosclerotic calcifications. No bowel dilatation, bowel wall thickening, or evidence of obstruction. Bowel staple lines noted in right mid abdomen.  IMPRESSION: Nonobstructive bowel gas pattern. Resolution of small bowel distention since prior exam.  Original Report Authenticated By: Lollie Marrow, M.D.    Anti-infectives: Anti-infectives     Start     Dose/Rate Route Frequency Ordered Stop   04/14/12 0830   ertapenem (INVANZ) 1 g in sodium chloride 0.9 % 50 mL IVPB        1 g 100 mL/hr over 30 Minutes Intravenous To Surgery 04/14/12 0816 04/14/12 0834   04/13/12 1359   ertapenem (INVANZ) 1 g in sodium chloride 0.9 % 50 mL IVPB  Status:  Discontinued  1 g 100 mL/hr over 30 Minutes Intravenous 60 min pre-op 04/13/12 1359 04/14/12 0816          Assessment/Plan: s/p Procedure(s) (LRB): Ex lap and LOA D/c NGT  Clear liquid diet  LOS: 15 days    Libra Gatz A. 04/17/2012

## 2012-04-17 NOTE — Progress Notes (Signed)
04/17/2012 patient transfer from 2300 to 6700. He is alert, oriented and get up with help. Patient complain of not feeling well when arrive on unit. Patient have some weakness, also complain of abdominal pain where he had the bowel resection. Patient incision is clean and dry, dressing suppose to be done every shift. On right sacrum there is 2  blister area noted on inner pat of right sacrum. Both feet dry. On left side of neck there is a dressing and it clean and dry. Patient have a graft on left thigh and it is positiveHe was place on telemetry when arrive on unit.   Gloriajean Dell RN

## 2012-04-17 NOTE — Progress Notes (Signed)
Pt tx to 6738, RN informed, rcvd by NT and tele confirmed.  VSS.

## 2012-04-17 NOTE — Evaluation (Signed)
Physical Therapy Evaluation Patient Details Name: [name] MRN: [id number] DOB: [birth year] Today's Date: 04/17/2012 Time: 9629-5284 PT Time Calculation (min): 17 min  PT Assessment / Plan / Recommendation Clinical Impression  [name] is 51 y/o male with ESRD who was admitted on 7/13 with SBO, underwent lysis of adhesions and partial small bowel resection x2 on 7/24 and later developed a clot in his L femoral AV graft requiring thrombectomy on 7/25. ETT 7/25->7/26. Upon PT eval today pt presents with impaired mobility secondary to surgical pain and generalized weakness as a result of prolonged bedrest affecting independence and prior level of function. Will benefit physical  therapy in the acute setting to address these and the below impairments so as to maximize mobility and independence for safe d/c home with support from his sister. Rec HHPT f/u with supervision/assist for mobility/OOB. Pt reports he can stay with his sister for a while.     PT Assessment  Patient needs continued PT services    Follow Up Recommendations  Home health PT;Supervision for mobility/OOB    Barriers to Discharge        Equipment Recommendations  None recommended by PT    Recommendations for Other Services OT consult   Frequency Min 3X/week    Precautions / Restrictions Precautions Precautions: Fall Restrictions Weight Bearing Restrictions: No         Mobility  Bed Mobility Bed Mobility: Rolling Right;Right Sidelying to Sit;Sit to Sidelying Right;Sitting - Scoot to Edge of Bed;Rolling Left Rolling Right: 4: Min assist Rolling Left: 5: Supervision Right Sidelying to Sit: 3: Mod assist Sitting - Scoot to Edge of Bed: 5: Supervision Sit to Sidelying Right: 4: Min assist Details for Bed Mobility Assistance: pt moving slowly and carefully because of pain, minA for follow through rolling onto side (educated pt on rolling to decrease abdominal pain); min-modA for sidelying->sit with pt pulling on  therapist and facilitation for upright posture;  assist to elevate legs back to bed  Transfers Transfers: Sit to Stand;Stand to Sit Sit to Stand: 4: Min assist;From bed;With upper extremity assist;From elevated surface Stand to Sit: 4: Min assist;To bed;With upper extremity assist;To elevated surface Details for Transfer Assistance: cues for safe hand placement, min facilitation for stability assist Ambulation/Gait Ambulation/Gait Assistance: 4: Min guard Ambulation Distance (Feet): 15 Feet Assistive device: Rolling walker Ambulation/Gait Assistance Details: slow cautious gait with very flexed trunk, resistant to upright posture because of stretching incision; decrease stride lenth and step height Gait Pattern: Step-through pattern;Trunk flexed    Exercises     PT Diagnosis: Difficulty walking;Abnormality of gait;Generalized weakness;Acute pain  PT Problem List: Decreased strength;Decreased activity tolerance;Decreased balance;Decreased mobility;Decreased knowledge of use of DME;Pain PT Treatment Interventions: DME instruction;Gait training;Stair training;Functional mobility training;Therapeutic activities;Therapeutic exercise;Balance training;Neuromuscular re-education;Patient/family education   PT Goals Acute Rehab PT Goals PT Goal Formulation: With patient Time For Goal Achievement: 05/01/12 Potential to Achieve Goals: Good Pt will Roll Supine to Right Side: with modified independence PT Goal: Rolling Supine to Right Side - Progress: Goal set today Pt will Roll Supine to Left Side: with modified independence PT Goal: Rolling Supine to Left Side - Progress: Goal set today Pt will go Supine/Side to Sit: with modified independence PT Goal: Supine/Side to Sit - Progress: Goal set today Pt will go Sit to Supine/Side: with modified independence PT Goal: Sit to Supine/Side - Progress: Goal set today Pt will go Sit to Stand: with modified independence PT Goal: Sit to Stand - Progress:  Goal set today  Pt will go Stand to Sit: with modified independence PT Goal: Stand to Sit - Progress: Goal set today Pt will Transfer Bed to Chair/Chair to Bed: with modified independence PT Transfer Goal: Bed to Chair/Chair to Bed - Progress: Goal set today Pt will Ambulate: >150 feet;with modified independence;with least restrictive assistive device PT Goal: Ambulate - Progress: Goal set today Pt will Go Up / Down Stairs: 3-5 stairs;with least restrictive assistive device;with min assist PT Goal: Up/Down Stairs - Progress: Goal set today Pt will Perform Home Exercise Program: Independently PT Goal: Perform Home Exercise Program - Progress: Goal set today  Visit Information  Last PT Received On: 04/17/12 Assistance Needed: +1    Subjective Data  Subjective: Im just hurting.    Prior Functioning  Home Living Lives With:  (plans to go live with his sister) Available Help at Discharge: Family;Available 24 hours/day Type of Home: House Home Access: Stairs to enter [employer] of Steps: 2-3 Entrance Stairs-Rails: None Bathroom Shower/Tub: Tub/shower unit Home Adaptive Equipment: Environmental consultant - rolling;Straight cane Prior Function Able to Take Stairs?: Yes Vocation: Retired Musician: No difficulties    Cognition  Overall Cognitive Status: Appears within functional limits for tasks assessed/performed Arousal/Alertness: Awake/alert Orientation Level: Appears intact for tasks assessed Behavior During Session: [facility] for tasks performed    Extremity/Trunk Assessment Right Upper Extremity Assessment RUE ROM/Strength/Tone: [facility] for tasks assessed;Deficits RUE ROM/Strength/Tone Deficits: decreased muscle mass, generally weak  RUE Sensation: WFL - Light Touch;WFL - Proprioception RUE Coordination: WFL - gross/fine motor Left Upper Extremity Assessment LUE ROM/Strength/Tone: WFL for tasks assessed;Deficits LUE ROM/Strength/Tone Deficits: decreased muscle mass,  generally weak  LUE Sensation: WFL - Light Touch;WFL - Proprioception LUE Coordination: WFL - gross/fine motor Right Lower Extremity Assessment RLE ROM/Strength/Tone: Deficits RLE ROM/Strength/Tone Deficits: generally weak, decrease muscle mass  RLE Sensation: WFL - Light Touch;WFL - Proprioception RLE Coordination: WFL - gross/fine motor Left Lower Extremity Assessment LLE ROM/Strength/Tone: Deficits LLE ROM/Strength/Tone Deficits: generally weak, decrease muscle mass  LLE Sensation: WFL - Light Touch;WFL - Proprioception LLE Coordination: WFL - gross/fine motor Trunk Assessment Trunk Assessment: Kyphotic   Balance Static Standing Balance Static Standing - Balance Support: Bilateral upper extremity supported Static Standing - Level of Assistance: 5: Stand by assistance  End of Session PT - End of Session Equipment Utilized During Treatment: Gait belt Activity Tolerance: Patient tolerated treatment well;Patient limited by pain;Patient limited by fatigue Patient left: in bed;with call bell/phone within reach Nurse Communication: Mobility status  GP     [facility] HELEN 04/17/2012, 2:07 PM

## 2012-04-17 NOTE — Progress Notes (Signed)
Impression/Plan  1. SBO- s/p exp lap with small bowel resection x 2  2. ESRD - HD TTS at [facility]; s/p Thrombectomy with post op hypotension requiring support with vent next HD today 3. Hyperkalemia(resolved), s/p emergency HD   4. ABLA, surgery related (hgb 14.1g on 7/25, 10.5g post op), recheck HbG   Subjective: Interval History: Feeling better Objective: Vital signs in last 24 hours: Temp:  [98 F (36.7 C)-98.8 F (37.1 C)] 98.4 F (36.9 C) (07/27 0807) Pulse Rate:  [84-116] 90  (07/27 0700) Resp:  [8-25] 13  (07/27 0700) BP: (122-181)/(59-105) 145/105 mmHg (07/27 0600) SpO2:  [93 %-100 %] 98 % (07/27 0700) Weight:  [46.8 kg (103 lb 2.8 oz)] 46.8 kg (103 lb 2.8 oz) (07/27 0600) Weight change: 0.1 kg (3.5 oz)  Intake/Output from previous day: 07/26 0701 - 07/27 0700 In: 220 [NG/GT:120; IV Piggyback:100] Out: 450 [Emesis/NG output:450] Intake/Output this shift:    General appearance: alert, cooperative and thin Resp: clear to auscultation bilaterally Cardio: regular rate and rhythm, S1, S2 normal, no murmur, click, rub or gallop GI: post op bandaged, soft Extremities: extremities normal, atraumatic, no cyanosis or edema and L groin AVG  Lab Results:  [facility] 04/16/12 0353 04/15/12 2018  WBC 8.8 11.4*  HGB 10.6* 10.5*  HCT 31.7* 32.1*  PLT 93* 123*   BMET:  Basename 04/16/12 0353 04/15/12 2018  NA 140 144  K 3.9 6.9*  CL 98 103  CO2 29 21  GLUCOSE 73 83  BUN 15 70*  CREATININE 3.71* 12.07*  CALCIUM 8.8 8.3*   No results found for this basename: PTH:2 in the last 72 hours Iron Studies: No results found for this basename: IRON,TIBC,TRANSFERRIN,FERRITIN in the last 72 hours Studies/Results: Dg Chest Port 1 View  04/15/2012  *RADIOLOGY REPORT*  Clinical Data: History of endotracheal tube placement.  History of line placement.  PORTABLE CHEST - 1 VIEW  Comparison: 04/07/2011.  Findings: Tip of endotracheal tube terminates 7 cm above the carina.  Enteric tube is in  place with distal portion extending into the upper left abdomen.  Tip is not included on the image. Left internal jugular venous catheter tip terminates in left axillary region.  There is no evidence of a pneumothorax.  No pulmonary infiltrates are evident.  No pleural effusion is seen. Cardiac silhouette is borderline in size.  On the previous study there is a catheter entering from the inferior vena cava approach. This catheter is no longer evident.  IMPRESSION: Endotracheal tube in place with tip above carina.  Enteric tube in place.  Left internal jugular venous catheter tip terminates in the left axillary region.  There is a note given in the history that Dr. Noreene Larsson was aware of the position of this catheter with no plan to reposition the catheter. No pneumothorax is evident.  Original Report Authenticated By: Crawford Givens, M.D.   Dg Abd Portable 1v  04/16/2012  *RADIOLOGY REPORT*  Clinical Data: No bowel activity since exploratory laparoscopy 2 days ago question ileus, right lower quadrant pain  PORTABLE ABDOMEN - 1 VIEW  Comparison: 04/13/2012  Findings: Nasogastric tube in stomach. Retained contrast in colon. Surgical clips in pelvis bilaterally. Scattered atherosclerotic calcifications. No bowel dilatation, bowel wall thickening, or evidence of obstruction. Bowel staple lines noted in right mid abdomen.  IMPRESSION: Nonobstructive bowel gas pattern. Resolution of small bowel distention since prior exam.  Original Report Authenticated By: Lollie Marrow, M.D.   Scheduled:   . ipratropium  0.5 mg Nebulization TID  And  . albuterol  2.5 mg Nebulization TID  . antiseptic oral rinse  15 mL Mouth Rinse q12n4p  . chlorhexidine  15 mL Mouth Rinse BID  . famotidine (PEPCID) IV  20 mg Intravenous Q12H  . DISCONTD: albuterol  8 puff Inhalation Q6H  . DISCONTD: albuterol  2.5 mg Nebulization Q4H  . DISCONTD: hydrocortisone sod succinate (SOLU-CORTEF) injection  50 mg Intravenous Q12H  . DISCONTD:  ipratropium  0.5 mg Nebulization Q4H     LOS: 15 days   Delmas Faucett C 04/17/2012,9:24 AM

## 2012-04-17 NOTE — Progress Notes (Signed)
Name: [name] MRN: [id number] DOB: Nov 11, 1960    LOS: 15  Referring Provider:  Larey Brick Reason for Referral:  Vent management  PULMONARY / CRITICAL CARE MEDICINE  Brief patient description:  51 y/o male with ESRD who was admitted on 7/13 with SBO, underwent lysis of adhesions and partial small bowel resection x2 on 7/24 and later developed a clot in his L femoral AV graft requiring thrombectomy on 7/25.  PCCM consulted for vent management.  Events Since Admission: 7/13  Admission 7/13  CT Abdomen with SBO 7/24  Ex-lap, lysis of adhesions, partial small bowel resection x2 7/25  Thrombectomy L femoral AV graft 7/26  Extubated, CVL is out  Current Status:   Denies chest pain, dyspnea, abdominal pain.  Wants to know when tube is coming out of his nose and when he can eat.  Vital Signs: Temp:  [98 F (36.7 C)-98.8 F (37.1 C)] 98.6 F (37 C) (07/27 0400) Pulse Rate:  [84-116] 90  (07/27 0700) Resp:  [8-25] 13  (07/27 0700) BP: (122-181)/(59-105) 145/105 mmHg (07/27 0600) SpO2:  [93 %-100 %] 98 % (07/27 0700) FiO2 (%):  [30 %] 30 % (07/26 0900) Weight:  [103 lb 2.8 oz (46.8 kg)] 103 lb 2.8 oz (46.8 kg) (07/27 0600)  Physical Examination: Gen: No distress HEENT: NG tube in place PULM: Bilateral diminished air entry, no added breath sounds CV: RRR, 2/6 SM AB: BS absent, soft, nontender, no hsm Ext: no edema Derm: no rash or skin breakdown Neuro: awake, alert, follows commands  Principal Problem:  *SBO (small bowel obstruction) s/p EL/LOA and SBR x 2 Active Problems:  CKD (chronic kidney disease) stage V requiring chronic dialysis  Erythrocytosis  Thrombocytopenia  Gout  HTN (hypertension)  Ileus following gastrointestinal surgery  Chronic use of steroids  Malnutrition, calorie  Leg graft occlusion  Acute respiratory failure with hypoxia  Sinus tachycardia  ASSESSMENT AND PLAN  PULMONARY  ETT:  7/25 >>> 7/26  A:  Post operative respiratory failure,  failed SBT 7/25 at 2020.  COPD? Emphysema on CXR. P:   Supplemental oxygen prn to keep SpO2 > 92% Bronchodilators>>change to tid and q2h prn  CARDIOVASCULAR  Lines: 7/25 L IJ CVL (placed in PACU) terminates in L cephalic vein >>> 7/26  A: Shock post operatively, likely due to sedation now resolved, neo off. 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  L femoral vein thrombus, s/p thrombectomy 7/25. P:  If has to have central access would consult IR Vascular following for thrombectomy  RENAL  Lab 04/16/12 0353 04/15/12 2018 04/14/12 1445 04/13/12 0650 04/12/12 0620  NA 140 144 140 137 136  K 3.9 6.9* -- -- --  CL 98 103 96 87* 90*  CO2 29 21 22 24 22   BUN 15 70* 50* 79* 60*  CREATININE 3.71* 12.07* 10.28* 14.26* 11.38*  CALCIUM 8.8 8.3* 8.6 9.6 9.8  MG 1.9 -- -- -- --  PHOS 4.3 -- -- 11.1* --   Intake/Output      07/26 0701 - 07/27 0700 07/27 0701 - 07/28 0700   I.V. (mL/kg)     NG/GT 120    IV Piggyback 100    Total Intake(mL/kg) 220 (4.7)    Emesis/NG output 450    Blood     Total Output 450    Net -230          Foley:  Anuric  A:  ESRD,  s/p thrombectomy 7/25 L fem AV graft.  Resolved hyperkalemia. P:   Per Renal Defer to renal whether he needs to resume prednisone (was on as outpt likely for hx of renal transplant)   GASTROINTESTINAL  Lab 04/16/12 0353 04/13/12 0650  AST 26 --  ALT 10 --  ALKPHOS 39 --  BILITOT 1.5* --  PROT 5.6* --  ALBUMIN 2.9* 3.4*   A:  SBO, s/p lysis of adhesions and partial small bowel resection 7/24; post op ileus. P:   Defer NG tube and resuming feeds to CCS If TNA is required would consult IR for CVL placement Post op care per surgery  HEMATOLOGIC  Lab 04/16/12 0353 04/15/12 2018 04/15/12 0500 04/14/12 1445 04/13/12 0650  HGB 10.6* 10.5* 14.1 14.0 15.1  HCT 31.7* 32.1* 43.6 42.8 44.0  PLT 93* 123* 159 133* 151  INR -- -- -- -- --  APTT -- -- -- -- --   A:  No acute  issues. P:  Trend CBC intermittently  INFECTIOUS  Lab 04/16/12 0353 04/15/12 2018 04/15/12 0500 04/14/12 1445 04/13/12 0650  WBC 8.8 11.4* 16.0* 12.8* 8.0  PROCALCITON -- -- -- -- --   Cultures:  NA  Antibiotics:  NA   A:  WBC up post operatively, no evidence of infection. P:   Trend CBC  ENDOCRINE  Lab 04/17/12 0401 04/17/12 0003 04/16/12 2014 04/16/12 1558 04/16/12 1202  GLUCAP 78 79 103* 96 121*   A:  Stress dose steroids 7/25 due to chronic steroid use (presumably due to prior kidney transplant), now hypertensive. P:   D/c'ed Hydrocortisone  NEUROLOGIC  A:  Post op pain/comfort P:   PRN morphine  BEST PRACTICE / DISPOSITION Level of Care:  Transfer to telemetry 7/27 Primary Service:  Transfer to [facility] 7/28 and PCCM sign off Consultants:  Vascular surgery, CCS, Renal Code Status:  Full Diet:  NPO DVT Px:  SCDs GI Px:  Pepcid Skin Integrity:  No issues Social / Family:  Not available during rounds  Respiratory status stable after extubation.  Will transfer to telemetry.  Will ask [facility] to resume care 7/28 and PCCM sign off.  Coralyn Helling, MD [facility] Pulmonary/Critical Care 04/17/2012, 8:05 AM Pager:  [phone] After 3pm call: [phone]

## 2012-04-17 NOTE — Progress Notes (Signed)
CBG: 67  Treatment: 15 GM carbohydrate snack  Symptoms: None  Follow-up CBG: Time:1251 CBG Result:70  Possible Reasons for Event: Inadequate meal intake  Comments/MD notified:no    Russell, [place] R

## 2012-04-18 DIAGNOSIS — I10 Essential (primary) hypertension: Secondary | ICD-10-CM

## 2012-04-18 LAB — CBC
Hemoglobin: 9.6 g/dL — ABNORMAL LOW (ref 13.0–17.0)
MCV: 86.6 fL (ref 78.0–100.0)
Platelets: 118 10*3/uL — ABNORMAL LOW (ref 150–400)
RBC: 3.35 MIL/uL — ABNORMAL LOW (ref 4.22–5.81)
WBC: 6.1 10*3/uL (ref 4.0–10.5)

## 2012-04-18 LAB — BASIC METABOLIC PANEL
CO2: 30 mEq/L (ref 19–32)
Calcium: 8.8 mg/dL (ref 8.4–10.5)
Glucose, Bld: 74 mg/dL (ref 70–99)
Potassium: 3.7 mEq/L (ref 3.5–5.1)
Sodium: 140 mEq/L (ref 135–145)

## 2012-04-18 LAB — GLUCOSE, CAPILLARY
Glucose-Capillary: 69 mg/dL — ABNORMAL LOW (ref 70–99)
Glucose-Capillary: 71 mg/dL (ref 70–99)
Glucose-Capillary: 77 mg/dL (ref 70–99)
Glucose-Capillary: 78 mg/dL (ref 70–99)
Glucose-Capillary: 79 mg/dL (ref 70–99)

## 2012-04-18 MED ORDER — ALBUTEROL SULFATE (5 MG/ML) 0.5% IN NEBU
2.5000 mg | INHALATION_SOLUTION | Freq: Four times a day (QID) | RESPIRATORY_TRACT | Status: DC | PRN
  Filled 2012-04-18: qty 0.5

## 2012-04-18 MED ORDER — RENA-VITE PO TABS
1.0000 | ORAL_TABLET | Freq: Every day | ORAL | Status: DC
  Administered 2012-04-18 – 2012-04-22 (×5): 1 via ORAL
  Filled 2012-04-18 (×5): qty 1

## 2012-04-18 MED ORDER — DARBEPOETIN ALFA-POLYSORBATE 100 MCG/0.5ML IJ SOLN
100.0000 ug | Freq: Once | INTRAMUSCULAR | Status: AC
  Administered 2012-04-18: 100 ug via SUBCUTANEOUS
  Filled 2012-04-18: qty 0.5

## 2012-04-18 MED ORDER — LANTHANUM CARBONATE 500 MG PO CHEW
1000.0000 mg | CHEWABLE_TABLET | Freq: Three times a day (TID) | ORAL | Status: DC
  Administered 2012-04-18 – 2012-04-22 (×5): 1000 mg via ORAL
  Filled 2012-04-18 (×15): qty 2

## 2012-04-18 MED ORDER — DARBEPOETIN ALFA-POLYSORBATE 100 MCG/0.5ML IJ SOLN
100.0000 ug | INTRAMUSCULAR | Status: DC
  Filled 2012-04-18: qty 0.5

## 2012-04-18 NOTE — Progress Notes (Signed)
Subjective:  Doing better; generalized weakness; having "good bit" abdominal pain; needs new IV; tolerating small amount liquids; ambulated once with PT yesterday  Vital signs in last 24 hours: Filed Vitals:   04/17/12 2150 04/17/12 2155 04/17/12 2241 04/18/12 0517  BP: 147/82 149/80 156/78 142/80  Pulse: 101 100 103 104  Temp:   98.2 F (36.8 C) 99.1 F (37.3 C)  TempSrc:   Oral Oral  Resp: 17 16 18 18   Height:      Weight:  48.2 kg (106 lb 4.2 oz)    SpO2:   99% 97%   Weight change: 2.1 kg (4 lb 10.1 oz)  Intake/Output Summary (Last 24 hours) at 04/18/12 0843 Last data filed at 04/17/12 2155  Gross per 24 hour  Intake    240 ml  Output    400 ml  Net   -160 ml   Labs: Basic Metabolic Panel:  Lab 04/18/12 4098 04/17/12 1930 04/16/12 0353 04/13/12 0650  NA 140 139 140 --  K 3.7 4.5 3.9 --  CL 99 97 98 --  CO2 30 26 29  --  GLUCOSE 74 95 73 --  BUN 22 59* 15 --  CREATININE 4.62* 8.64* 3.71* --  CALCIUM 8.8 9.0 8.8 --  ALB -- -- -- --  PHOS -- 7.0* 4.3 11.1*   Liver Function Tests:  Lab 04/17/12 1930 04/16/12 0353 04/13/12 0650  AST -- 26 --  ALT -- 10 --  ALKPHOS -- 39 --  BILITOT -- 1.5* --  PROT -- 5.6* --  ALBUMIN 2.6* 2.9* 3.4*   No results found for this basename: LIPASE:3,AMYLASE:3 in the last 168 hours No results found for this basename: AMMONIA:3 in the last 168 hours CBC:  Lab 04/18/12 0525 04/17/12 1930 04/16/12 0353 04/15/12 2018 04/15/12 0500  WBC 6.1 7.3 8.8 -- --  NEUTROABS -- -- 7.3 -- --  HGB 9.6* 9.6* 10.6* -- --  HCT 29.0* 29.1* 31.7* -- --  MCV 86.6 84.8 86.4 86.8 87.0  PLT 118* 123* 93* -- --   Cardiac Enzymes: No results found for this basename: CKTOTAL:5,CKMB:5,CKMBINDEX:5,TROPONINI:5 in the last 168 hours CBG:  Lab 04/18/12 0516 04/18/12 0409 04/18/12 0027 04/17/12 2238 04/17/12 1717  GLUCAP 77 69* 78 78 78    Iron Studies: No results found for this basename: IRON,TIBC,TRANSFERRIN,FERRITIN in the last 72  hours Studies/Results: Dg Abd Portable 1v  04/16/2012  *RADIOLOGY REPORT*  Clinical Data: No bowel activity since exploratory laparoscopy 2 days ago question ileus, right lower quadrant pain  PORTABLE ABDOMEN - 1 VIEW  Comparison: 04/13/2012  Findings: Nasogastric tube in stomach. Retained contrast in colon. Surgical clips in pelvis bilaterally. Scattered atherosclerotic calcifications. No bowel dilatation, bowel wall thickening, or evidence of obstruction. Bowel staple lines noted in right mid abdomen.  IMPRESSION: Nonobstructive bowel gas pattern. Resolution of small bowel distention since prior exam.  Original Report Authenticated By: Lollie Marrow, M.D.   Medications:    . sodium chloride 10 mL/hr (04/14/12 1600)      . ipratropium  0.5 mg Nebulization TID   And  . albuterol  2.5 mg Nebulization TID  . antiseptic oral rinse  15 mL Mouth Rinse q12n4p  . chlorhexidine  15 mL Mouth Rinse BID  . famotidine  20 mg Oral Daily  . DISCONTD: famotidine (PEPCID) IV  20 mg Intravenous Q12H    I  have reviewed scheduled and prn medications.  Physical Exam:  General: thin, sedate post-op; opens eyes and responds approp;  NAD  Heart: RRR, 2/6 SEM  Lungs: diminished but clear; no adventitious B.S.  Abdomen: NGT intact to suction; bulky abd dressing; no BS present  Extremities: no ankle edema  Dialysis Access: left thigh AVG +T/B   Dialysis Orders: Center: [facility] on TTS.  EDW 52.5 kg HD Bath 2K/2Ca Time 3.5 hrs Heparin 5000 U. Access AVG @ left thigh BFR 400 DFR 800 Zemplar 0 mcg IV/HD Epogen 0 Units IV/HD Venofer 0.   Impression/Plan  1. SBO- s/p exp lap with small bowel resection x 2; POD # 4; NGT out; +BS; not passing gas yet; tolerating CL (follow K closely); ambulated yesterday with PT; encouraged increase movement, use IS q2h while awake, splint abdomen and prn pain med ~1hr prior to PT to facilitate increase participation; surgery following  2. ESRD - HD TTS at [facility]; HD yesterday with  resolved hyperkalemia; K level now 3.7; next tx Tu; significant weight loss since adm and will need new eDW at time of discharge 3. Hypertension/volume - labile BP's; no BP meds at home; off and on Norvasc and Labetalol since adm; became hypotensive again and now off meds again; suspect BP control with UF only; follow closely 4. Anemia - Hgb 9.6; was14.0 post-op (prev 15.4), no Epo or Fe, will Start Aranesp (epogen 10,000 qtx) giving first dose sq today then weekly with HD on Sat next week with HD; follow 5. Metabolic bone disease - Ca 8.8, phos down to 4.3 on 7/26 but ^ 7.0 yesterday; Fosrenol was not resumed; will start 1gm po TIDWC and Renal Vitamin today; follow 6. Nutrition/hypoglycemia -Alb 2.6 and still trending down with poor intake; on clear liquids with poor intake; glucose 67-68 last night and this am requiring intervention; given 15gm carb snacks each time; encourage to drink small freq sips liquid; Add NEPRO scheduled TID when diet advanced; follow. 7. S/p renal transplant - at [facility] in '87, started HD in 04/2003, remains on Prednisone.  8. Hepatitis C/thrombocytopenia- Plt 118 this am; cont no heparin with HD and follow 9. Disposition- per primary; deconditioned and currently followed by PT/OT; possible need for short term rehab   Stephen Germany, FNP-C [facility] Pager [phone]  04/18/2012,8:43 AM  LOS: 16 days   Stable post op with assessment and plan as articulated above. Lanyiah Brix C

## 2012-04-18 NOTE — Progress Notes (Signed)
CBG: 68  Treatment: 15 GM carbohydrate snack  Symptoms: None  Follow-up CBG: Time: 0516 CBG Result: 77  Possible Reasons for Event: Inadequate meal intake  Comments/MD notified:    Alonza Bogus

## 2012-04-18 NOTE — Progress Notes (Signed)
3 Days Post-Op  Subjective: POD#3 No nausea or flatus but some bloating with NG out  Objective: Vital signs in last 24 hours: Temp:  [97.3 F (36.3 C)-99.1 F (37.3 C)] 99.1 F (37.3 C) (07/28 0517) Pulse Rate:  [87-105] 104  (07/28 0517) Resp:  [12-20] 18  (07/28 0517) BP: (138-165)/(76-91) 142/80 mmHg (07/28 0517) SpO2:  [95 %-100 %] 97 % (07/28 0517) Weight:  [106 lb 4.2 oz (48.2 kg)-107 lb 12.9 oz (48.9 kg)] 106 lb 4.2 oz (48.2 kg) (07/27 2155) Last BM Date: 04/13/12  Intake/Output from previous day: 07/27 0701 - 07/28 0700 In: 260 [P.O.:240; NG/GT:20] Out: 400  Intake/Output this shift:    Abdomen slight full Incision clean  Lab Results:   Basename 04/18/12 0525 04/17/12 1930  WBC 6.1 7.3  HGB 9.6* 9.6*  HCT 29.0* 29.1*  PLT 118* 123*   BMET  Basename 04/18/12 0525 04/17/12 1930  NA 140 139  K 3.7 4.5  CL 99 97  CO2 30 26  GLUCOSE 74 95  BUN 22 59*  CREATININE 4.62* 8.64*  CALCIUM 8.8 9.0   PT/INR No results found for this basename: LABPROT:2,INR:2 in the last 72 hours ABG  Basename 04/15/12 2204 04/15/12 2148  PHART 7.290* 7.284*  HCO3 21.6 21.8    Studies/Results: No results found.  Anti-infectives: Anti-infectives     Start     Dose/Rate Route Frequency Ordered Stop   04/14/12 0830   ertapenem (INVANZ) 1 g in sodium chloride 0.9 % 50 mL IVPB        1 g 100 mL/hr over 30 Minutes Intravenous To Surgery 04/14/12 0816 04/14/12 0834   04/13/12 1359   ertapenem (INVANZ) 1 g in sodium chloride 0.9 % 50 mL IVPB  Status:  Discontinued        1 g 100 mL/hr over 30 Minutes Intravenous 60 min pre-op 04/13/12 1359 04/14/12 0816          Assessment/Plan: s/p Procedure(s) (LRB): THROMBECTOMY AND REVISION OF ARTERIOVENTOUS (AV) GORETEX  GRAFT (Left)  Continue current care Would hold on advancing po intake any further  LOS: 16 days    [name] A 04/18/2012

## 2012-04-18 NOTE — Progress Notes (Signed)
[facility] PROGRESS NOTE  SPIRO AUSBORN JXB:[id number] DOB: [birth year] DOA: 04/02/2012 PCP: Trevor Iha, MD  Assessment/Plan:  SBO (small bowel obstruction) s/p EL/LOA and SBR x 2  CKD (chronic kidney disease) stage V requiring chronic dialysis  Erythrocytosis  Thrombocytopenia  Gout  HTN (hypertension)  Ileus following gastrointestinal surgery  Chronic use of steroids  Malnutrition, calorie  Leg graft occlusion  Acute respiratory failure with hypoxia  Sinus tachycardia  1-SBO (small bowel obstruction) s/p EL/LOA and SBR x 2/ Ileus following gastrointestinal surgery  *Primary management per surgical team  *Clinical picture consistent with postoperative ileus. Patient liquid diet. Need frequent ambulation.    2-Leg graft occlusion  Acute occlusion of dialysis leg graft, patient s/p thrombectomy 7/25 L fem AV graft.  3-Acute respiratory failure with hypoxia  Patient develop post operative respiratory failure 7-25 after AV thrombectomy. Patient remain intubated, CCM assume care. Respiratory failure post surgery probably related to COPD/Emphysema/ sedatives. He was extubated 7-26. Respiratory status stable.   4-Thrombocytopenia  Presented with thrombocytopenia and nadir of 87,000, platelet level fluctuate over course hospitalization.    5-Shock post operatively, likely due to sedation now resolved, neo off.  6-HTN (hypertension)  Monitor BP. Continue to hold oral medications.   7-Malnutrition, calorie  Liquid diet, will start Nepro when tolerates diet better.   8-Sinus tachycardia  Continue to monitor   9-CKD (chronic kidney disease) stage V requiring chronic dialysis  Dialysis per renal. Patient was on chronic prednisone after renal transplant. Will defer to renal restart prednisone.   10-Gout/Chronic use of steroids  No evidence of acute exacerbation      Code Status: Full Family Communication: No family at bedside.  Disposition Plan: discharge when  ok by surgery. PT recommend home health.    Brief narrative: 51 y/o male with ESRD who was admitted on 7/13 with SBO, underwent lysis of adhesions and partial small bowel resection x2 on 7/24 and later developed a clot in his L femoral AV graft requiring thrombectomy on 7/25. PCCM consulted for vent management. [facility] assume care 7-28.    Consultants: Dr. Zacarias Pontes surgery  Dr. Powell/nephrology  Dr. Callie Fielding surgery  Dr. Borden/urology   Procedures/Events:  7/13 Admission  7/13 CT Abdomen with SBO  7/24 Ex-lap, lysis of adhesions, partial small bowel resection x2  7/25 Thrombectomy L femoral AV graft  7/26 Extubated, CVL is out  Antibiotics: Invanz IV 7/23>>>7/24   HPI/Subjective: Patient feeling tired, unwilling to speak a lot. Relates some abdominal pain and distension but no worse.  Barely drinking.   Objective: Filed Vitals:   04/17/12 2155 04/17/12 2241 04/18/12 0517 04/18/12 1007  BP: 149/80 156/78 142/80 122/70  Pulse: 100 103 104 108  Temp:  98.2 F (36.8 C) 99.1 F (37.3 C) 99.4 F (37.4 C)  TempSrc:  Oral Oral Oral  Resp: 16 18 18 17   Height:      Weight: 48.2 kg (106 lb 4.2 oz)     SpO2:  99% 97% 96%    Intake/Output Summary (Last 24 hours) at 04/18/12 1321 Last data filed at 04/18/12 0900  Gross per 24 hour  Intake     60 ml  Output    400 ml  Net   -340 ml    Exam:   General:  No distress.   Cardiovascular: S1, S2 RRR  Respiratory: CTA.  Abdomen: BS presents, soft, tender generalized, clean dressing mid abdomen.   Data Reviewed: Basic Metabolic Panel:  Lab 04/18/12 1308 04/17/12 1930 04/16/12 6578  04/15/12 2018 04/14/12 1445 04/13/12 0650  NA 140 139 140 144 140 --  K 3.7 4.5 3.9 6.9* 5.4* --  CL 99 97 98 103 96 --  CO2 30 26 29 21 22  --  GLUCOSE 74 95 73 83 83 --  BUN 22 59* 15 70* 50* --  CREATININE 4.62* 8.64* 3.71* 12.07* 10.28* --  CALCIUM 8.8 9.0 8.8 8.3* 8.6 --  MG -- -- 1.9 -- -- --  PHOS -- 7.0* 4.3 -- --  11.1*   Liver Function Tests:  Lab 04/17/12 1930 04/16/12 0353 04/13/12 0650  AST -- 26 --  ALT -- 10 --  ALKPHOS -- 39 --  BILITOT -- 1.5* --  PROT -- 5.6* --  ALBUMIN 2.6* 2.9* 3.4*   CBC:  Lab 04/18/12 0525 04/17/12 1930 04/16/12 0353 04/15/12 2018 04/15/12 0500  WBC 6.1 7.3 8.8 11.4* 16.0*  NEUTROABS -- -- 7.3 -- --  HGB 9.6* 9.6* 10.6* 10.5* 14.1  HCT 29.0* 29.1* 31.7* 32.1* 43.6  MCV 86.6 84.8 86.4 86.8 87.0  PLT 118* 123* 93* 123* 159   CBG:  Lab 04/18/12 0750 04/18/12 0516 04/18/12 0409 04/18/12 0027 04/17/12 2238  GLUCAP 71 77 69* 78 78     Studies: Ct Abdomen Pelvis Wo Contrast  04/03/2012  *RADIOLOGY REPORT*  Clinical Data: Abdominal pain  CT ABDOMEN AND PELVIS WITHOUT CONTRAST  Technique:  Multidetector CT imaging of the abdomen and pelvis was performed following the standard protocol without intravenous contrast.  Comparison: 11/01/2004  Findings: Coronary artery calcification.  Linear opacity at the left lung base.  Organ abnormality/lesion detection is limited in the absence of intravenous contrast. Within this limitation, unremarkable liver, biliary system, spleen, adrenal glands. There is dilatation of the main pancreatic duct, measuring up to 5 mm.  Atrophic native kidneys with bilateral cystic lesions of varying complexity.  Transplant kidney left lower quadrant with sinus lipomatosis and cortical thinning.  There is a loop of small bowel distended up to 3.3 cm and demonstrating air fluid level.  Distal small bowel is decompressed.  Colonic diverticulosis.  No CT evidence for diverticulitis  No free intraperitoneal air or fluid.  No lymphadenopathy.  Advanced atherosclerotic disease of the aorta and branch vessels.  Decompressed bladder.  Surgical clips in the pelvis.  Partially imaged left femoral bypass.  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 Unchanged cystic and sclerotic changes of the right femoral neck.  No acute osseous finding.  IMPRESSION:  Dilated small bowel loops up to 3.3 cm with air-fluid levels. Decompressed bowel loops distally.  This may reflect an early small bowel obstruction.  Recommend correlation with symptoms and if no intervention, follow-up KUB to document passage of contrast into the colon.  Advanced atherosclerotic disease.  Transplant kidney left lower quadrant with prominent renal sinus fat, similar to prior.  Bilateral native kidneys with incompletely characterized cystic lesions of varying complexity.  Consider follow-up renal MRI.  Dilated main pancreatic duct up to 5 mm.  No obstructing lesion visualized by CT.  This can also be better characterized at the time of MR.  Original Report Authenticated By: Waneta Martins, M.D.   Dg Abd 1 View  04/12/2012  *RADIOLOGY REPORT*  Clinical Data: Abdominal pain, constipation, nausea and vomiting  ABDOMEN - 1 VIEW  Comparison: 04/11/2012; 04/08/2012; 04/06/2012; chest CT of 04/03/2012  Findings:  No change to minimal decrease in gaseous distension of several loops of small bowel with index loop within the right mid hemiabdomen measuring approximately  3.9 cm in diameter.  Minimal transit of previously ingested oral contrast with enteric contrast now seen within the splenic flexure of the colon. Evaluation pneumoperitoneum is limited secondary to supine patient positioning.  No definite pneumatosis or portal venous gas.  Vascular calcifications overlie the left upper abdominal quadrant. Multiple surgical clips overlie the pelvis.  Grossly unchanged bones.  IMPRESSION: No change to minimal decrease in findings compatible with partial small bowel obstruction.  Original Report Authenticated By: Waynard Reeds, M.D.   Dg Abd 1 View  04/11/2012  *RADIOLOGY REPORT*  Clinical Data:  small bowel obstruction  ABDOMEN - 1 VIEW  Comparison: 04/08/2012  Findings: Persistent small bowel dilatation in the left mid abdomen measuring 4.6 cm in diameter.  Retained contrast and stool in the the  right colon.  Minimal improvement compared to 04/08/2012. Postop changes of the pelvis.  Vascular calcifications present.  IMPRESSION: Some improvement in partial small bowel obstruction pattern.  Original Report Authenticated By: Judie Petit. Ruel Favors, M.D.   Abd 1 View (kub)  04/05/2012  *RADIOLOGY REPORT*  Clinical Data: Follow-up small bowel obstruction.  ABDOMEN - 1 VIEW  Comparison: 04/03/2012  Findings: Dilated lower abdominal small bowel loops are again noted compatible with small bowel obstruction.  Likely no significant change since prior CT.  No free air.  Surgical clips in the pelvis. No acute bony abnormality.  IMPRESSION: Stable small bowel dilatation in the lower abdomen, likely no significant change.  Original Report Authenticated By: Cyndie Chime, M.D.   Dg Chest Port 1 View  04/15/2012  *RADIOLOGY REPORT*  Clinical Data: History of endotracheal tube placement.  History of line placement.  PORTABLE CHEST - 1 VIEW  Comparison: 04/07/2011.  Findings: Tip of endotracheal tube terminates 7 cm above the carina.  Enteric tube is in place with distal portion extending into the upper left abdomen.  Tip is not included on the image. Left internal jugular venous catheter tip terminates in left axillary region.  There is no evidence of a pneumothorax.  No pulmonary infiltrates are evident.  No pleural effusion is seen. Cardiac silhouette is borderline in size.  On the previous study there is a catheter entering from the inferior vena cava approach. This catheter is no longer evident.  IMPRESSION: Endotracheal tube in place with tip above carina.  Enteric tube in place.  Left internal jugular venous catheter tip terminates in the left axillary region.  There is a note given in the history that Dr. Noreene Larsson was aware of the position of this catheter with no plan to reposition the catheter. No pneumothorax is evident.  Original Report Authenticated By: Crawford Givens, M.D.   Dg Abd 2 Views  04/13/2012  *RADIOLOGY  REPORT*  Clinical Data: Abdominal pain, weakness.  ABDOMEN - 2 VIEW  Comparison: Abdominal radiograph 04/12/2012.  Findings: There is some gas, stool and residual oral contrast material noted within the colon.  There is a paucity of distal colonic and rectal gas. Visualized portions of the colon do not appear dilated.  Several dilated loops of small bowel are seen in the upper abdomen, measuring up to 4.2 cm in diameter.  No gross evidence of pneumoperitoneum is noted on this limited supine view of the abdomen.  Multiple surgical clips are seen throughout the pelvis.  Numerous vascular calcifications are noted.  IMPRESSION: 1.  The bowel gas pattern is again most consistent with a partial small-bowel obstruction, as above.  The appearance is very similar to the recent prior examination.  Original Report Authenticated By: Florencia Reasons, M.D.   Dg Abd 2 Views  04/08/2012  *RADIOLOGY REPORT*  Clinical Data: Bowel obstruction, abdominal pain  ABDOMEN - 2 VIEW  Comparison: 04/06/2012  Findings: Tip of nasogastric tube in distal esophagus, recommend advancing tube 10 cm. Persistent dilatation of small bowel loops up to 4.2 cm diameter. No bowel wall thickening or free peritoneal air. Lung bases appear clear. Gas and stool are present within the colon. Surgical clips in pelvis. Scattered atherosclerotic calcifications. Bones appear demineralized with mild degenerative changes of the hip joints.  IMPRESSION: Persistent dilatation of small bowel loops though more gas and stool are present within the colon, consistent with partial small bowel obstruction. Recommend advancing nasogastric tube 10 cm.  Findings called to [facility] RN in Hemodialysis, where patient is currently located, on 04/08/2012 at 0805 hours.  Original Report Authenticated By: Lollie Marrow, M.D.   Dg Abd 2 Views  04/06/2012  *RADIOLOGY REPORT*  Clinical Data: Small bowel obstruction.  Abdominal pain.  ABDOMEN - 2 VIEW  Comparison: Radiographs  dated 04/05/2012 and CT scan dated 04/03/2012  Findings: There is persistent dilatation of multiple small bowel loops.  The contrast given for the prior CT scan remains in these dilated small bowel loops.  No contrast in the nondistended colon.  No free air in the upright radiograph.  IMPRESSION: Persistent small bowel obstruction, unchanged.  Original Report Authenticated By: Gwynn Burly, M.D.   Dg Abd Portable 1v  04/16/2012  *RADIOLOGY REPORT*  Clinical Data: No bowel activity since exploratory laparoscopy 2 days ago question ileus, right lower quadrant pain  PORTABLE ABDOMEN - 1 VIEW  Comparison: 04/13/2012  Findings: Nasogastric tube in stomach. Retained contrast in colon. Surgical clips in pelvis bilaterally. Scattered atherosclerotic calcifications. No bowel dilatation, bowel wall thickening, or evidence of obstruction. Bowel staple lines noted in right mid abdomen.  IMPRESSION: Nonobstructive bowel gas pattern. Resolution of small bowel distention since prior exam.  Original Report Authenticated By: Lollie Marrow, M.D.    Scheduled Meds:   . antiseptic oral rinse  15 mL Mouth Rinse q12n4p  . chlorhexidine  15 mL Mouth Rinse BID  . darbepoetin (ARANESP) injection - DIALYSIS  100 mcg Subcutaneous Once  . darbepoetin (ARANESP) injection - DIALYSIS  100 mcg Intravenous Q Sat-HD  . famotidine  20 mg Oral Daily  . lanthanum  1,000 mg Oral TID WC  . multivitamin  1 tablet Oral Daily  . DISCONTD: albuterol  2.5 mg Nebulization TID  . DISCONTD: ipratropium  0.5 mg Nebulization TID   Continuous Infusions:   . sodium chloride 10 mL/hr (04/14/12 1600)      Time spent: 25 minutes.    Lizzeth Meder  [facility] Pager [phone]. If 8PM-8AM, please contact night-coverage at [email or web address], password [facility] 04/18/2012, 1:21 PM  LOS: 16 days

## 2012-04-19 LAB — POCT I-STAT 3, ART BLOOD GAS (G3+)
Bicarbonate: 21.8 mEq/L (ref 20.0–24.0)
TCO2: 23 mmol/L (ref 0–100)
pCO2 arterial: 45.7 mmHg — ABNORMAL HIGH (ref 35.0–45.0)
pH, Arterial: 7.284 — ABNORMAL LOW (ref 7.350–7.450)
pO2, Arterial: 36 mmHg — CL (ref 80.0–100.0)

## 2012-04-19 LAB — GLUCOSE, CAPILLARY
Glucose-Capillary: 68 mg/dL — ABNORMAL LOW (ref 70–99)
Glucose-Capillary: 73 mg/dL (ref 70–99)

## 2012-04-19 MED ORDER — PREDNISONE 5 MG PO TABS
5.0000 mg | ORAL_TABLET | Freq: Every day | ORAL | Status: DC
  Administered 2012-04-20 – 2012-04-22 (×3): 5 mg via ORAL
  Filled 2012-04-19 (×4): qty 1

## 2012-04-19 MED ORDER — SODIUM CHLORIDE 0.9 % IV SOLN
62.5000 mg | INTRAVENOUS | Status: DC
Start: 2012-04-20 — End: 2012-04-21
  Administered 2012-04-20: 62.5 mg via INTRAVENOUS
  Filled 2012-04-19 (×2): qty 5

## 2012-04-19 MED ORDER — HEPARIN SODIUM (PORCINE) 1000 UNIT/ML DIALYSIS
20.0000 [IU]/kg | INTRAMUSCULAR | Status: DC | PRN
  Filled 2012-04-19: qty 1

## 2012-04-19 MED ORDER — BOOST / RESOURCE BREEZE PO LIQD
1.0000 | Freq: Two times a day (BID) | ORAL | Status: DC
  Administered 2012-04-21: 1 via ORAL

## 2012-04-19 NOTE — Progress Notes (Signed)
Subjective: Patient relates abdominal pressure, distension. He does not want to eat due to distension.  He feels he can not walk because of abdominal pressure.  He does not want to talk right now.  Objective: Filed Vitals:   04/18/12 2138 04/19/12 0607 04/19/12 1000 04/19/12 1400  BP: 142/78 145/80 151/81 137/78  Pulse: 97 101 100 95  Temp: 98.8 F (37.1 C) 98.4 F (36.9 C) 98 F (36.7 C) 98.6 F (37 C)  TempSrc: Oral Oral Oral Oral  Resp: 18 20 20 20   Height:      Weight:      SpO2: 97% 99% 98% 97%   Weight change: -0.6 kg (-1 lb 5.2 oz)   General: Alert, awake, in no acute distress.  HEENT: No bruits, no goiter.  Heart: Regular rate and rhythm, without murmurs, rubs, gallops.  Lungs: CTA bilateral air movement.  Abdomen:  positive bowel sounds, he didn't allow me to palpate his abdomen. .     Lab Results:  Basename 04/18/12 0525 04/17/12 1930  NA 140 139  K 3.7 4.5  CL 99 97  CO2 30 26  GLUCOSE 74 95  BUN 22 59*  CREATININE 4.62* 8.64*  CALCIUM 8.8 9.0  MG -- --  PHOS -- 7.0*    Basename 04/17/12 1930  AST --  ALT --  ALKPHOS --  BILITOT --  PROT --  ALBUMIN 2.6*    Basename 04/18/12 0525 04/17/12 1930  WBC 6.1 7.3  NEUTROABS -- --  HGB 9.6* 9.6*  HCT 29.0* 29.1*  MCV 86.6 84.8  PLT 118* 123*    Studies/Results: No results found.  Medications: I have reviewed the patient's current medications.  1-SBO (small bowel obstruction) s/p EL/LOA and SBR x 2/ Ileus following gastrointestinal surgery  *Primary management per surgical team  *Clinical picture consistent with postoperative ileus. Patient liquid diet. Need frequent ambulation.   2-Leg graft occlusion  Acute occlusion of dialysis leg graft, patient s/p thrombectomy 7/25 L fem AV graft.  3-Acute respiratory failure with hypoxia  Patient develop post operative respiratory failure 7-25 after AV thrombectomy. Patient remain intubated, CCM assume care. Respiratory failure post surgery probably  related to COPD/Emphysema/ sedatives. He was extubated 7-26. Respiratory status stable.  4-Thrombocytopenia  Presented with thrombocytopenia and nadir of 87,000, platelet level fluctuate over course hospitalization.  5-Shock post operatively, likely due to sedation now resolved, neo off.  6-HTN (hypertension)  Monitor BP. Continue to hold oral medications.  7-Malnutrition, calorie  Liquid diet,start Resource . 8-Sinus tachycardia  Continue to monitor  9-CKD (chronic kidney disease) stage V requiring chronic dialysis  Dialysis per renal. Patient was on chronic prednisone after renal transplant. Will resume low dose prednisone.  10-Gout/Chronic use of steroids  No evidence of acute exacerbation        LOS: 17 days   Welda Azzarello M.D.  [facility] 04/19/2012, 3:55 PM

## 2012-04-19 NOTE — Progress Notes (Signed)
Patient ID: [name], male   DOB: Apr 29, 1961, 51 y.o.   MRN: [id number] 4 Days Post-Op  Subjective:  Tolerating liquid diet ,+ flatus, no BM,  Still some bloating with NG out, but no c/o N/V.  Objective: Vital signs in last 24 hours: Temp:  [98.4 F (36.9 C)-99.4 F (37.4 C)] 98.4 F (36.9 C) (07/29 0607) Pulse Rate:  [94-108] 101  (07/29 0607) Resp:  [17-20] 20  (07/29 0607) BP: (122-145)/(70-80) 145/80 mmHg (07/29 0607) SpO2:  [95 %-99 %] 99 % (07/29 0607) Weight:  [106 lb 7.7 oz (48.3 kg)] 106 lb 7.7 oz (48.3 kg) (07/28 2121) Last BM Date: 04/13/12  Intake/Output from previous day: 07/28 0701 - 07/29 0700 In: 180 [P.O.:180] Out: -  Intake/Output this shift:   General appearance: A/A/O,  Abdomen: distended, + flatus, tender over surgical site. Dsg C/DI.  Tolerating diet w/o N/V VSS afebrile.  Lab Results:   [facility] 04/18/12 0525 04/17/12 1930  WBC 6.1 7.3  HGB 9.6* 9.6*  HCT 29.0* 29.1*  PLT 118* 123*   BMET  Basename 04/18/12 0525 04/17/12 1930  NA 140 139  K 3.7 4.5  CL 99 97  CO2 30 26  GLUCOSE 74 95  BUN 22 59*  CREATININE 4.62* 8.64*  CALCIUM 8.8 9.0   PT/INR No results found for this basename: LABPROT:2,INR:2 in the last 72 hours ABG No results found for this basename: PHART:2,PCO2:2,PO2:2,HCO3:2 in the last 72 hours  Studies/Results: No results found.  Anti-infectives: Anti-infectives     Start     Dose/Rate Route Frequency Ordered Stop   04/14/12 0830   ertapenem (INVANZ) 1 g in sodium chloride 0.9 % 50 mL IVPB        1 g 100 mL/hr over 30 Minutes Intravenous To Surgery 04/14/12 0816 04/14/12 0834   04/13/12 1359   ertapenem (INVANZ) 1 g in sodium chloride 0.9 % 50 mL IVPB  Status:  Discontinued        1 g 100 mL/hr over 30 Minutes Intravenous 60 min pre-op 04/13/12 1359 04/14/12 0816          Assessment/Plan: s/p Procedure(s) (LRB): THROMBECTOMY AND REVISION OF ARTERIOVENTOUS (AV) GORETEX  GRAFT (Left)  Continue current  care Continue current diet for now. May need to check abd films again if bloating does not resolve. (Will discuss with Dr. Johna Sheriff) PT/OT   LOS: 17 days    Cace Osorto, Riki Rusk 04/19/2012

## 2012-04-19 NOTE — Progress Notes (Signed)
[facility] Progress Note  Subjective:  Belly feels distended. Passing a little gas. Plans to go live with his sister for a while after d/c.  Objective Filed Vitals:   04/18/12 1400 04/18/12 2121 04/18/12 2138 04/19/12 0607  BP: 133/74  142/78 145/80  Pulse: 94  97 101  Temp: 99.2 F (37.3 C)  98.8 F (37.1 C) 98.4 F (36.9 C)  TempSrc: Oral  Oral Oral  Resp: 18  18 20   Height:      Weight:  48.3 kg (106 lb 7.7 oz)    SpO2: 95%  97% 99%   Physical Exam General: emaciated NAD sitting in bed Heart: RRR Lungs: no wheezes or rales Abdomen: thin, slightly distended, few BS left side Extremities: no LE edema Dialysis Access: left thigh AVGG patent  Dialysis Orders: Center: [facility] on TTS.  EDW 52.5 kg HD Bath 2K/2Ca Time 3.5 hrs Heparin 5000 U. Access AVG @ left thigh BFR 400 DFR 800 Zemplar 0 mcg IV/HD Epogen 0 Units IV/HD Venofer 50/week; last iPTH 314, but product high and no zemplar  Assessment/Plan: 1. SBO- s/p exp lap with small bowel resection x 2; POD # 5; NGT out; small flatus; tolerating CL ; PT; encouraged increase movement, surgery following  2. ESRD - HD TTS at [facility]; HD yesterday with resolved hyperkalemia; significant weight loss since adm and will need new eDW at time of discharge 3. Hypertension/volume - labile BP's; no BP meds at home; off and on Norvasc and Labetalol since adm; became hypotensive again and now off meds again; suspect BP control with UF only; follow closely 4. Anemia - Hgb 9.6; was14.0 post-op (prev 15.4), no Epo or Fe, will  Aranesp started 7/28 ; check Fe studies with HD; resume weekly IV Fe 5. Metabolic bone disease -  Fosrenol was resumed at 1gm po TIDWC - but no P in CL, so nursing is holding for now 6. Nutrition/hypoglycemia -Alb 2.6;  on clear liquids w ; encourage to drink small freq sips liquid; Add Resource scheduled BID  7. S/p renal transplant - at [facility] in '87, started HD in 04/2003, remains on Prednisone.  8. Hepatitis  C/thrombocytopenia- Plts improving cont no heparin with HD and follow 9. Disposition- per primary; deconditioned and currently followed by PT/OT; possible need for short term rehab   Sheffield Slider, PA-C [facility] Beeper [phone]  04/19/2012,11:02 AM  LOS: 17 days   Patient seen and examined and agree with assessment and plan as above.  Vinson Moselle  MD [facility] [phone] pgr    [phone] cell 04/19/2012, 12:21 PM   Additional Objective Labs: Basic Metabolic Panel:  Lab 04/18/12 8413 04/17/12 1930 04/16/12 0353 04/13/12 0650  NA 140 139 140 --  K 3.7 4.5 3.9 --  CL 99 97 98 --  CO2 30 26 29  --  GLUCOSE 74 95 73 --  BUN 22 59* 15 --  CREATININE 4.62* 8.64* 3.71* --  CALCIUM 8.8 9.0 8.8 --  ALB -- -- -- --  PHOS -- 7.0* 4.3 11.1*   Liver Function Tests:  Lab 04/17/12 1930 04/16/12 0353 04/13/12 0650  AST -- 26 --  ALT -- 10 --  ALKPHOS -- 39 --  BILITOT -- 1.5* --  PROT -- 5.6* --  ALBUMIN 2.6* 2.9* 3.4*  CBC:  Lab 04/18/12 0525 04/17/12 1930 04/16/12 0353 04/15/12 2018 04/15/12 0500  WBC 6.1 7.3 8.8 -- --  NEUTROABS -- -- 7.3 -- --  HGB 9.6*  9.6* 10.6* -- --  HCT 29.0* 29.1* 31.7* -- --  MCV 86.6 84.8 86.4 86.8 87.0  PLT 118* 123* 93* -- --  CBG:  Lab 04/19/12 0904 04/19/12 0747 04/19/12 0407 04/18/12 2356 04/18/12 2001  GLUCAP 76 67* 73 68* 79  Medications:    . sodium chloride 10 mL/hr (04/14/12 1600)      . antiseptic oral rinse  15 mL Mouth Rinse q12n4p  . chlorhexidine  15 mL Mouth Rinse BID  . darbepoetin (ARANESP) injection - DIALYSIS  100 mcg Subcutaneous Once  . darbepoetin (ARANESP) injection - DIALYSIS  100 mcg Intravenous Q Sat-HD  . famotidine  20 mg Oral Daily  . lanthanum  1,000 mg Oral TID WC  . multivitamin  1 tablet Oral Daily

## 2012-04-19 NOTE — Progress Notes (Signed)
Physical Therapy Treatment Patient Details Name: [name] MRN: [id number] DOB: 1960/11/09 Today's Date: 04/19/2012 Time: 1201-1227 PT Time Calculation (min): 26 min  PT Assessment / Plan / Recommendation Comments on Treatment Session  Needing lots of encouragement to participate, but seemed more conmfortable once he walked and sat in recliner; Will need to address gently stretching into upright posture to avoid contracture    Follow Up Recommendations  Home health PT;Supervision for mobility/OOB    Barriers to Discharge        Equipment Recommendations  None recommended by PT    Recommendations for Other Services OT consult  Frequency Min 3X/week   Plan Discharge plan remains appropriate    Precautions / Restrictions Precautions Precautions: Fall   Pertinent Vitals/Pain 5/10 abdominal pain initially; Requested pain meds; RN came quickly and administered IV pain meds prior to getting up    Mobility  Bed Mobility Bed Mobility: Rolling Right;Right Sidelying to Sit Rolling Right: 4: Min guard;With rail Right Sidelying to Sit: 4: Min assist;With rails Sitting - Scoot to Edge of Bed: 5: Supervision Details for Bed Mobility Assistance: Cues for rolling and sidelie to sit technique; continuing to move slowly because of pain Transfers Transfers: Sit to Stand;Stand to Sit Sit to Stand: 4: Min assist;From bed;With upper extremity assist;From elevated surface Stand to Sit: 4: Min guard;To chair/3-in-1;With upper extremity assist Details for Transfer Assistance: cues for safe hand placement, min facilitation for stability assist Ambulation/Gait Ambulation/Gait Assistance: 4: Min guard Ambulation Distance (Feet): 17 Feet Assistive device: Rolling walker Ambulation/Gait Assistance Details: Continued slow and cautious gait, painful; continued flexed posture as pt is hesitant to stretch incision Gait Pattern: Step-through pattern;Trunk flexed    Exercises     PT Diagnosis:    PT  Problem List:   PT Treatment Interventions:     PT Goals Acute Rehab PT Goals Time For Goal Achievement: 05/01/12 Potential to Achieve Goals: Good Pt will Roll Supine to Right Side: with modified independence PT Goal: Rolling Supine to Right Side - Progress: Progressing toward goal Pt will Roll Supine to Left Side: with modified independence PT Goal: Rolling Supine to Left Side - Progress: Progressing toward goal Pt will go Supine/Side to Sit: with modified independence PT Goal: Supine/Side to Sit - Progress: Progressing toward goal Pt will go Sit to Stand: with modified independence PT Goal: Sit to Stand - Progress: Progressing toward goal Pt will go Stand to Sit: with modified independence PT Goal: Stand to Sit - Progress: Progressing toward goal Pt will Ambulate: >150 feet;with modified independence;with least restrictive assistive device PT Goal: Ambulate - Progress: Progressing toward goal (very slowly)  Visit Information  Last PT Received On: 04/19/12 Assistance Needed: +1    Subjective Data  Subjective: Wants a pain shot before getting up; Reported feeling a little better after getting OOB   Cognition  Overall Cognitive Status: Appears within functional limits for tasks assessed/performed Arousal/Alertness: Awake/alert Orientation Level: Appears intact for tasks assessed Behavior During Session: [facility] for tasks performed    Balance     End of Session PT - End of Session Equipment Utilized During Treatment: Gait belt Activity Tolerance: Patient tolerated treatment well;Patient limited by pain Patient left: in chair;with call bell/phone within reach Nurse Communication: Mobility status   GP     Olen Pel [place], [place] [phone]  04/19/2012, 1:43 PM

## 2012-04-19 NOTE — Progress Notes (Signed)
Still somewhat distended on exam but feeling better and passing some flatus Cont liquid diet today Needs to increase activity  Mariella Saa MD, FACS  04/19/2012, 9:59 AM

## 2012-04-20 ENCOUNTER — Inpatient Hospital Stay (HOSPITAL_COMMUNITY): Payer: Medicare Other

## 2012-04-20 LAB — IRON AND TIBC
Iron: <10 ug/dL — ABNORMAL LOW (ref 42–135)
UIBC: 66 ug/dL — ABNORMAL LOW (ref 125–400)

## 2012-04-20 LAB — GLUCOSE, CAPILLARY
Glucose-Capillary: 68 mg/dL — ABNORMAL LOW (ref 70–99)
Glucose-Capillary: 78 mg/dL (ref 70–99)
Glucose-Capillary: 81 mg/dL (ref 70–99)
Glucose-Capillary: 87 mg/dL (ref 70–99)
Glucose-Capillary: 95 mg/dL (ref 70–99)

## 2012-04-20 LAB — RENAL FUNCTION PANEL
Albumin: 2.3 g/dL — ABNORMAL LOW (ref 3.5–5.2)
BUN: 60 mg/dL — ABNORMAL HIGH (ref 6–23)
CO2: 25 mEq/L (ref 19–32)
Calcium: 8.6 mg/dL (ref 8.4–10.5)
Chloride: 96 mEq/L (ref 96–112)
Creatinine, Ser: 10.31 mg/dL — ABNORMAL HIGH (ref 0.50–1.35)
GFR calc Af Amer: 6 mL/min — ABNORMAL LOW (ref >90)
GFR calc non Af Amer: 5 mL/min — ABNORMAL LOW (ref >90)
Glucose, Bld: 76 mg/dL (ref 70–99)
Phosphorus: 4.7 mg/dL — ABNORMAL HIGH (ref 2.3–4.6)
Potassium: 4.4 mEq/L (ref 3.5–5.1)
Sodium: 136 mEq/L (ref 135–145)

## 2012-04-20 LAB — CBC
HCT: 27.7 % — ABNORMAL LOW (ref 39.0–52.0)
Hemoglobin: 9.1 g/dL — ABNORMAL LOW (ref 13.0–17.0)
MCH: 28.2 pg (ref 26.0–34.0)
MCHC: 32.9 g/dL (ref 30.0–36.0)
MCV: 85.8 fL (ref 78.0–100.0)
Platelets: 124 10*3/uL — ABNORMAL LOW (ref 150–400)
RBC: 3.23 MIL/uL — ABNORMAL LOW (ref 4.22–5.81)
RDW: 13.5 % (ref 11.5–15.5)
WBC: 7.4 10*3/uL (ref 4.0–10.5)

## 2012-04-20 LAB — FERRITIN: Ferritin: 552 ng/mL — ABNORMAL HIGH (ref 22–322)

## 2012-04-20 MED ORDER — PANTOPRAZOLE SODIUM 40 MG PO TBEC
40.0000 mg | DELAYED_RELEASE_TABLET | Freq: Two times a day (BID) | ORAL | Status: DC
  Administered 2012-04-20 – 2012-04-22 (×4): 40 mg via ORAL
  Filled 2012-04-20 (×4): qty 1

## 2012-04-20 NOTE — Progress Notes (Signed)
[facility] PROGRESS NOTE  [name] EAV:[id number] DOB: Oct 26, 1960 DOA: 04/02/2012 PCP: Trevor Iha, MD  Assessment/Plan:  SBO (small bowel obstruction) s/p EL/LOA and SBR x 2  CKD (chronic kidney disease) stage V requiring chronic dialysis  Erythrocytosis  Thrombocytopenia  Gout  HTN (hypertension)  Ileus following gastrointestinal surgery  Chronic use of steroids  Malnutrition, calorie  Leg graft occlusion  Acute respiratory failure with hypoxia  Sinus tachycardia  1-SBO (small bowel obstruction) s/p EL/LOA and SBR x 2/ Ileus following gastrointestinal surgery  Primary management per surgical team  Clinical picture consistent with postoperative ileus. Patient liquid diet. Need frequent ambulation.  Passing gas. I will add protonix. Will defer to surgery parenteral nutrition.   2-Leg graft occlusion  Acute occlusion of dialysis leg graft, patient s/p thrombectomy 7/25 L fem AV graft.   3-Acute respiratory failure with hypoxia  Patient develop post operative respiratory failure 7-25 after AV thrombectomy. Patient remain intubated, CCM assume care. Respiratory failure post surgery probably related to COPD/Emphysema/ sedatives. He was extubated 7-26. Respiratory status stable.   4-Thrombocytopenia  Presented with thrombocytopenia and nadir of 87,000, platelet level fluctuate over course hospitalization. Platelet increasing.  5-Shock post operatively, likely due to sedation now resolved, neo off.   6-HTN (hypertension)  Monitor BP. Continue to hold oral medications.   7-Malnutrition, calorie  Liquid diet,start Resource .  Will defer parenteral nutrition to Surgery.   8-Sinus tachycardia  Continue to monitor  9-CKD (chronic kidney disease) stage V requiring chronic dialysis  Dialysis per renal. Patient was on chronic prednisone after renal transplant. Will resume low dose prednisone.  10-Gout/Chronic use of steroids  No evidence of acute exacerbation      Code Status: Full  Family Communication: No family at bedside.  Disposition Plan: discharge when ok by surgery. PT recommend home health.   Brief narrative:  51 y/o male with ESRD who was admitted on 7/13 with SBO, underwent lysis of adhesions and partial small bowel resection x2 on 7/24 and later developed a clot in his L femoral AV graft requiring thrombectomy on 7/25. PCCM consulted for vent management. [facility] assume care 7-28.   Consultants:  Dr. Zacarias Pontes surgery  Dr. Powell/nephrology  Dr. Callie Fielding surgery  Dr. Borden/urology   Procedures/Events:  7/13 Admission  7/13 CT Abdomen with SBO  7/24 Ex-lap, lysis of adhesions, partial small bowel resection x2  7/25 Thrombectomy L femoral AV graft  7/26 Extubated, CVL is out   Antibiotics:  Invanz IV 7/23>>>7/24      HPI/Subjective: Patient relates feeling weak. Complaining of gas, burping. Does not want to eat because of gas.  He is passing flatus. No BM.    Objective: Filed Vitals:   04/20/12 1018 04/20/12 1037 04/20/12 1056 04/20/12 1400  BP: 157/87 165/84 143/78 138/80  Pulse: 102 101 102 101  Temp:  97.6 F (36.4 C) 97.8 F (36.6 C) 98.2 F (36.8 C)  TempSrc:  Oral Oral Oral  Resp: 14 17 18 18   Height:      Weight:  47.7 kg (105 lb 2.6 oz)    SpO2:  96% 98% 98%    Intake/Output Summary (Last 24 hours) at 04/20/12 1705 Last data filed at 04/20/12 1300  Gross per 24 hour  Intake     60 ml  Output   1000 ml  Net   -940 ml    Exam:   General:  No distress, thin appearing.   Cardiovascular: S1, S2 RRR.   Respiratory: CTA  Abdomen:  Bs presents, clean dressing med abdomen, soft.   Extremities; no edema.   Data Reviewed: Basic Metabolic Panel:  Lab 04/20/12 1610 04/18/12 0525 04/17/12 1930 04/16/12 0353 04/15/12 2018  NA 136 140 139 140 144  K 4.4 3.7 4.5 3.9 6.9*  CL 96 99 97 98 103  CO2 25 30 26 29 21   GLUCOSE 76 74 95 73 83  BUN 60* 22 59* 15 70*  CREATININE 10.31*  4.62* 8.64* 3.71* 12.07*  CALCIUM 8.6 8.8 9.0 8.8 8.3*  MG -- -- -- 1.9 --  PHOS 4.7* -- 7.0* 4.3 --   Liver Function Tests:  Lab 04/20/12 0655 04/17/12 1930 04/16/12 0353  AST -- -- 26  ALT -- -- 10  ALKPHOS -- -- 39  BILITOT -- -- 1.5*  PROT -- -- 5.6*  ALBUMIN 2.3* 2.6* 2.9*   CBC:  Lab 04/20/12 0655 04/18/12 0525 04/17/12 1930 04/16/12 0353 04/15/12 2018  WBC 7.4 6.1 7.3 8.8 11.4*  NEUTROABS -- -- -- 7.3 --  HGB 9.1* 9.6* 9.6* 10.6* 10.5*  HCT 27.7* 29.0* 29.1* 31.7* 32.1*  MCV 85.8 86.6 84.8 86.4 86.8  PLT 124* 118* 123* 93* 123*   CBG:  Lab 04/20/12 1639 04/20/12 1154 04/20/12 1052 04/20/12 0424 04/20/12 0014  GLUCAP 83 78 70 81 68*    No results found for this or any previous visit (from the past 240 hour(s)).   Studies: Ct Abdomen Pelvis Wo Contrast  04/03/2012  *RADIOLOGY REPORT*  Clinical Data: Abdominal pain  CT ABDOMEN AND PELVIS WITHOUT CONTRAST  Technique:  Multidetector CT imaging of the abdomen and pelvis was performed following the standard protocol without intravenous contrast.  Comparison: 11/01/2004  Findings: Coronary artery calcification.  Linear opacity at the left lung base.  Organ abnormality/lesion detection is limited in the absence of intravenous contrast. Within this limitation, unremarkable liver, biliary system, spleen, adrenal glands. There is dilatation of the main pancreatic duct, measuring up to 5 mm.  Atrophic native kidneys with bilateral cystic lesions of varying complexity.  Transplant kidney left lower quadrant with sinus lipomatosis and cortical thinning.  There is a loop of small bowel distended up to 3.3 cm and demonstrating air fluid level.  Distal small bowel is decompressed.  Colonic diverticulosis.  No CT evidence for diverticulitis  No free intraperitoneal air or fluid.  No lymphadenopathy.  Advanced atherosclerotic disease of the aorta and branch vessels.  Decompressed bladder.  Surgical clips in the pelvis.  Partially imaged left  femoral bypass.  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 Unchanged cystic and sclerotic changes of the right femoral neck.  No acute osseous finding.  IMPRESSION: Dilated small bowel loops up to 3.3 cm with air-fluid levels. Decompressed bowel loops distally.  This may reflect an early small bowel obstruction.  Recommend correlation with symptoms and if no intervention, follow-up KUB to document passage of contrast into the colon.  Advanced atherosclerotic disease.  Transplant kidney left lower quadrant with prominent renal sinus fat, similar to prior.  Bilateral native kidneys with incompletely characterized cystic lesions of varying complexity.  Consider follow-up renal MRI.  Dilated main pancreatic duct up to 5 mm.  No obstructing lesion visualized by CT.  This can also be better characterized at the time of MR.  Original Report Authenticated By: Waneta Martins, M.D.   Dg Abd 1 View  04/12/2012  *RADIOLOGY REPORT*  Clinical Data: Abdominal pain, constipation, nausea and vomiting  ABDOMEN - 1 VIEW  Comparison: 04/11/2012; 04/08/2012; 04/06/2012; chest  CT of 04/03/2012  Findings:  No change to minimal decrease in gaseous distension of several loops of small bowel with index loop within the right mid hemiabdomen measuring approximately 3.9 cm in diameter.  Minimal transit of previously ingested oral contrast with enteric contrast now seen within the splenic flexure of the colon. Evaluation pneumoperitoneum is limited secondary to supine patient positioning.  No definite pneumatosis or portal venous gas.  Vascular calcifications overlie the left upper abdominal quadrant. Multiple surgical clips overlie the pelvis.  Grossly unchanged bones.  IMPRESSION: No change to minimal decrease in findings compatible with partial small bowel obstruction.  Original Report Authenticated By: Waynard Reeds, M.D.   Dg Abd 1 View  04/11/2012  *RADIOLOGY REPORT*  Clinical Data:  small bowel obstruction  ABDOMEN  - 1 VIEW  Comparison: 04/08/2012  Findings: Persistent small bowel dilatation in the left mid abdomen measuring 4.6 cm in diameter.  Retained contrast and stool in the the right colon.  Minimal improvement compared to 04/08/2012. Postop changes of the pelvis.  Vascular calcifications present.  IMPRESSION: Some improvement in partial small bowel obstruction pattern.  Original Report Authenticated By: Judie Petit. Ruel Favors, M.D.   Abd 1 View (kub)  04/05/2012  *RADIOLOGY REPORT*  Clinical Data: Follow-up small bowel obstruction.  ABDOMEN - 1 VIEW  Comparison: 04/03/2012  Findings: Dilated lower abdominal small bowel loops are again noted compatible with small bowel obstruction.  Likely no significant change since prior CT.  No free air.  Surgical clips in the pelvis. No acute bony abnormality.  IMPRESSION: Stable small bowel dilatation in the lower abdomen, likely no significant change.  Original Report Authenticated By: Cyndie Chime, M.D.   Dg Chest Port 1 View  04/15/2012  *RADIOLOGY REPORT*  Clinical Data: History of endotracheal tube placement.  History of line placement.  PORTABLE CHEST - 1 VIEW  Comparison: 04/07/2011.  Findings: Tip of endotracheal tube terminates 7 cm above the carina.  Enteric tube is in place with distal portion extending into the upper left abdomen.  Tip is not included on the image. Left internal jugular venous catheter tip terminates in left axillary region.  There is no evidence of a pneumothorax.  No pulmonary infiltrates are evident.  No pleural effusion is seen. Cardiac silhouette is borderline in size.  On the previous study there is a catheter entering from the inferior vena cava approach. This catheter is no longer evident.  IMPRESSION: Endotracheal tube in place with tip above carina.  Enteric tube in place.  Left internal jugular venous catheter tip terminates in the left axillary region.  There is a note given in the history that Dr. Noreene Larsson was aware of the position of this  catheter with no plan to reposition the catheter. No pneumothorax is evident.  Original Report Authenticated By: Crawford Givens, M.D.   Dg Abd 2 Views  04/20/2012  *RADIOLOGY REPORT*  Clinical Data: Postop abdominal distention, recent of abdominal surgery for small bowel obstruction and lysis of the patient with partial small bowel resection  ABDOMEN - 2 VIEW  Comparison: Portable abdomen film of 04/16/2012  Findings: Both large and small bowel gas is present which may indicate postoperative ileus.  However there does appear be more distention of small bowel loops and a partial small bowel obstruction cannot be excluded.  Some contrast is scattered throughout the bowel.  Arterial calcifications are noted.  IMPRESSION: Distention of both large and small bowel may indicate ileus, but a partial small bowel obstruction cannot  be excluded.  Original Report Authenticated By: Juline Patch, M.D.   Dg Abd 2 Views  04/13/2012  *RADIOLOGY REPORT*  Clinical Data: Abdominal pain, weakness.  ABDOMEN - 2 VIEW  Comparison: Abdominal radiograph 04/12/2012.  Findings: There is some gas, stool and residual oral contrast material noted within the colon.  There is a paucity of distal colonic and rectal gas. Visualized portions of the colon do not appear dilated.  Several dilated loops of small bowel are seen in the upper abdomen, measuring up to 4.2 cm in diameter.  No gross evidence of pneumoperitoneum is noted on this limited supine view of the abdomen.  Multiple surgical clips are seen throughout the pelvis.  Numerous vascular calcifications are noted.  IMPRESSION: 1.  The bowel gas pattern is again most consistent with a partial small-bowel obstruction, as above.  The appearance is very similar to the recent prior examination.  Original Report Authenticated By: Florencia Reasons, M.D.   Dg Abd 2 Views  04/08/2012  *RADIOLOGY REPORT*  Clinical Data: Bowel obstruction, abdominal pain  ABDOMEN - 2 VIEW  Comparison: 04/06/2012   Findings: Tip of nasogastric tube in distal esophagus, recommend advancing tube 10 cm. Persistent dilatation of small bowel loops up to 4.2 cm diameter. No bowel wall thickening or free peritoneal air. Lung bases appear clear. Gas and stool are present within the colon. Surgical clips in pelvis. Scattered atherosclerotic calcifications. Bones appear demineralized with mild degenerative changes of the hip joints.  IMPRESSION: Persistent dilatation of small bowel loops though more gas and stool are present within the colon, consistent with partial small bowel obstruction. Recommend advancing nasogastric tube 10 cm.  Findings called to [facility] RN in Hemodialysis, where patient is currently located, on 04/08/2012 at 0805 hours.  Original Report Authenticated By: Lollie Marrow, M.D.   Dg Abd 2 Views  04/06/2012  *RADIOLOGY REPORT*  Clinical Data: Small bowel obstruction.  Abdominal pain.  ABDOMEN - 2 VIEW  Comparison: Radiographs dated 04/05/2012 and CT scan dated 04/03/2012  Findings: There is persistent dilatation of multiple small bowel loops.  The contrast given for the prior CT scan remains in these dilated small bowel loops.  No contrast in the nondistended colon.  No free air in the upright radiograph.  IMPRESSION: Persistent small bowel obstruction, unchanged.  Original Report Authenticated By: Gwynn Burly, M.D.   Dg Abd Portable 1v  04/16/2012  *RADIOLOGY REPORT*  Clinical Data: No bowel activity since exploratory laparoscopy 2 days ago question ileus, right lower quadrant pain  PORTABLE ABDOMEN - 1 VIEW  Comparison: 04/13/2012  Findings: Nasogastric tube in stomach. Retained contrast in colon. Surgical clips in pelvis bilaterally. Scattered atherosclerotic calcifications. No bowel dilatation, bowel wall thickening, or evidence of obstruction. Bowel staple lines noted in right mid abdomen.  IMPRESSION: Nonobstructive bowel gas pattern. Resolution of small bowel distention since prior exam.  Original  Report Authenticated By: Lollie Marrow, M.D.    Scheduled Meds:   . antiseptic oral rinse  15 mL Mouth Rinse q12n4p  . chlorhexidine  15 mL Mouth Rinse BID  . darbepoetin (ARANESP) injection - DIALYSIS  100 mcg Intravenous Q Sat-HD  . feeding supplement  1 Container Oral BID BM  . ferric gluconate (FERRLECIT/NULECIT) IV  62.5 mg Intravenous Q Tue-HD  . lanthanum  1,000 mg Oral TID WC  . multivitamin  1 tablet Oral Daily  . predniSONE  5 mg Oral Q breakfast   Continuous Infusions:   . sodium chloride 10 mL/hr (  04/14/12 1600)       Time spent: 20 minutes.     REGALADO,BELKYS  [facility] Pager [phone]. If 8PM-8AM, please contact night-coverage at [email or web address], password [facility] 04/20/2012, 5:05 PM  LOS: 18 days

## 2012-04-20 NOTE — Progress Notes (Signed)
Physical Therapy Treatment Patient Details Name: [name] MRN: [id number] DOB: 1960-10-11 Today's Date: 04/20/2012 Time: 4010-2725 PT Time Calculation (min): 18 min  PT Assessment / Plan / Recommendation Comments on Treatment Session  Continues to need max encouragement to participate; We had a lengthy discussion re: benefits of increasing activity and progressively ambulate to help ileus resolve; Pt verbalized understanding ; Hopefully we can gently progress ambulation over next few sessions    Follow Up Recommendations  Home health PT;Supervision for mobility/OOB    Barriers to Discharge        Equipment Recommendations  None recommended by PT    Recommendations for Other Services OT consult  Frequency Min 3X/week   Plan Discharge plan remains appropriate    Precautions / Restrictions Precautions Precautions: Fall   Pertinent Vitals/Pain Did not rate abdominal pain, but requesting pain meds post OOB    Mobility  Bed Mobility Bed Mobility: Rolling Right;Right Sidelying to Sit Rolling Right: 4: Min guard;With rail Right Sidelying to Sit: 4: Min assist;With rails Sitting - Scoot to Edge of Bed: 5: Supervision Details for Bed Mobility Assistance: Cues for rolling and sidelie to sit technique; continuing to move slowly because of pain Transfers Transfers: Sit to Stand;Stand to Sit Sit to Stand: From bed;With upper extremity assist;4: Min assist (Elevated bed) Stand to Sit: 4: Min guard;To chair/3-in-1;With upper extremity assist Details for Transfer Assistance: continued need for cues for safe hand placement; pt still pulled up on RW despite cues not to; Required physical assist to steady RW, but not for anti-gravity, lift assist Ambulation/Gait Ambulation/Gait Assistance: 4: Min guard Ambulation Distance (Feet): 17 Feet (pt only agreeable to walking around bed to chair) Assistive device: Rolling walker Ambulation/Gait Assistance Details: Continued slow gait with flexed  trunk; Cues to glide RW smoothly rather than picking it up for better efficiency with amb Gait Pattern: Step-through pattern;Trunk flexed    Exercises     PT Diagnosis:    PT Problem List:   PT Treatment Interventions:     PT Goals Acute Rehab PT Goals Time For Goal Achievement: 05/01/12 Potential to Achieve Goals: Good Pt will Roll Supine to Right Side: with modified independence PT Goal: Rolling Supine to Right Side - Progress: Progressing toward goal Pt will go Supine/Side to Sit: with modified independence PT Goal: Supine/Side to Sit - Progress: Progressing toward goal Pt will go Sit to Stand: with modified independence PT Goal: Sit to Stand - Progress: Progressing toward goal Pt will go Stand to Sit: with modified independence PT Goal: Stand to Sit - Progress: Progressing toward goal Pt will Ambulate: >150 feet;with modified independence;with least restrictive assistive device PT Goal: Ambulate - Progress: Progressing toward goal (Slowly)  Visit Information  Last PT Received On: 04/20/12 Assistance Needed: +1    Subjective Data  Subjective: Agreeable to getting OOB, does not want to progressively amb today   Cognition  Overall Cognitive Status: Appears within functional limits for tasks assessed/performed Arousal/Alertness: Awake/alert Orientation Level: Appears intact for tasks assessed Behavior During Session: [facility] for tasks performed    Balance     End of Session PT - End of Session Equipment Utilized During Treatment: Gait belt Activity Tolerance: Patient limited by pain Patient left: in chair;with call bell/phone within reach Nurse Communication: Mobility status;Patient requests pain meds   GP     Van Clines [facility] [place], [place] [phone]  04/20/2012, 3:31 PM

## 2012-04-20 NOTE — Progress Notes (Signed)
PARENTERAL NUTRITION CONSULT NOTE - INITIAL  Pharmacy Consult for TPN Indication: ileus, enteral feed intolerance  Allergies  Allergen Reactions  . Allopurinol     REACTION: decreased platelets  . Aspirin     REACTION: unspecified    Patient Measurements: Height: 5\' 11"  (180.3 cm) Weight: 105 lb 2.6 oz (47.7 kg) IBW/kg (Calculated) : 75.3   Vital Signs: Temp: 98.2 F (36.8 C) (07/30 1400) Temp src: Oral (07/30 1400) BP: 138/80 mmHg (07/30 1400) Pulse Rate: 101  (07/30 1400) Intake/Output from previous day: 07/29 0701 - 07/30 0700 In: 600 [P.O.:600] Out: -  Intake/Output from this shift: Total I/O In: 60 [P.O.:60] Out: 1000 [Other:1000]  Labs:  [facility] 04/20/12 0655 04/18/12 0525 04/17/12 1930  WBC 7.4 6.1 7.3  HGB 9.1* 9.6* 9.6*  HCT 27.7* 29.0* 29.1*  PLT 124* 118* 123*  APTT -- -- --  INR -- -- --     Basename 04/20/12 0655 04/18/12 0525 04/17/12 1930  NA 136 140 139  K 4.4 3.7 4.5  CL 96 99 97  CO2 25 30 26   GLUCOSE 76 74 95  BUN 60* 22 59*  CREATININE 10.31* 4.62* 8.64*  LABCREA -- -- --  CREAT24HRUR -- -- --  CALCIUM 8.6 8.8 9.0  MG -- -- --  PHOS 4.7* -- 7.0*  PROT -- -- --  ALBUMIN 2.3* -- 2.6*  AST -- -- --  ALT -- -- --  ALKPHOS -- -- --  BILITOT -- -- --  BILIDIR -- -- --  IBILI -- -- --  PREALBUMIN -- -- --  TRIG -- -- --  CHOLHDL -- -- --  CHOL -- -- --   Estimated Creatinine Clearance: 5.7 ml/min (by C-G formula based on Cr of 10.31).    Basename 04/20/12 1154 04/20/12 1052 04/20/12 0424  GLUCAP 78 70 81    Medical History: Past Medical History  Diagnosis Date  . Dialysis patient   . Prostate cancer     Medications:  Infusions:    . sodium chloride 10 mL/hr (04/14/12 1600)    Insulin Requirements in the past 24 hours:  none  Current Nutrition:  CL diet  Assessment: Mr. Raden was admitted 7/13 with abd pain, nausea/vomiting and 5 Kg weight loss in 6 days. Patient was made NPO for abd CT that was done on  admission. Patient initially had an NGT for decompression, which was then taken out when he tolerated clamping (7/19). He was then started on a clear liquid diet, which could not be advanced d/t intermittent cramping. Pt ultimately taken to OR for exp lap, LOA and SBR of chronic SBO (7/24).   Nutritional Goals:  1700-1950 kCal, 75-85 grams of protein per day  Plan:  Check am labs and begin TPN after PICC line placed on 7.31.  Verlene Mayer, PharmD, BCPS Pager [phone] 04/20/2012,3:02 PM

## 2012-04-20 NOTE — Progress Notes (Signed)
[facility] Progress Note  Subjective: Pain controlled  Objective Filed Vitals:   04/20/12 0733 04/20/12 0800 04/20/12 0830 04/20/12 0900  BP: 147/87 160/84 148/82 153/85  Pulse: 102 98 102 101  Temp:      TempSrc:      Resp: 14 14 15 16   Height:      Weight:      SpO2:       Physical Exam General: on HD, NAD, emaciated Heart: tachy rate 100 - 105- regular Lungs: no wheezes or rales Abdomen: soft + BS Extremities: no LE edema Dialysis Access:  Left thigh graft q b 400  Dialysis Orders: Center: [facility] on TTS.  EDW 52.5 kg HD Bath 2K/2Ca Time 3.5 hrs Heparin 5000 U. Access AVG @ left thigh BFR 400 DFR 800 Zemplar 0 mcg IV/HD Epogen 0 Units IV/HD Venofer 50/week; last iPTH 314, but product high and no zemplar   Assessment/Plan:  1. SBO- s/p exp lap with small bowel resection x 2; POD # 6;  tolerating CL ; PT; slow progress 2. ESRD - HD TTS at [facility]; HD yesterday with resolved hyperkalemia; significant weight loss since adm and will need new eDW at time of discharge 3. Hypertension/volume - labile BP's; no BP meds at home; off and on Norvasc and Labetalol since adm; became hypotensive again and now off meds again; suspect BP control with UF only; follow closely 4. Anemia - Hgb 9.6 down to 9.1 was14.0 post-op (prev 15.4), no Epo or Fe, will Aranesp started 7/28 ; check Fe studies with HD; resume weekly IV Fe 5. Metabolic bone disease - Fosrenol was resumed at 1gm po [facility] - but no P in CL, so nursing is holding for now; P 4.7 today 6. Protein calorie malnutrition-Alb 2.3; on clear liquids w ; encourage to drink small freq sips liquid; Add Resource scheduled BID = CL high protein supplement 7. S/p renal transplant - at [facility] in '87, started HD in 04/2003, remains on Prednisone.  8. Hepatitis C/thrombocytopenia- Plts stable;  no heparin with HD and follow       9.   Disposition- per primary; deconditioned and currently followed by PT/OT;   Sheffield Slider,  PA-C [facility] Beeper [phone]  04/20/2012,9:30 AM  LOS: 18 days   Patient seen and examined and agree with assessment and plan as above.  Vinson Moselle  MD [facility] [phone] pgr    [phone] cell 04/20/2012, 11:56 AM     Additional Objective Labs: Basic Metabolic Panel:  Lab 04/20/12 2952 04/18/12 0525 04/17/12 1930 04/16/12 0353  NA 136 140 139 --  K 4.4 3.7 4.5 --  CL 96 99 97 --  CO2 25 30 26  --  GLUCOSE 76 74 95 --  BUN 60* 22 59* --  CREATININE 10.31* 4.62* 8.64* --  CALCIUM 8.6 8.8 9.0 --  ALB -- -- -- --  PHOS 4.7* -- 7.0* 4.3   Liver Function Tests:  Lab 04/20/12 0655 04/17/12 1930 04/16/12 0353  AST -- -- 26  ALT -- -- 10  ALKPHOS -- -- 39  BILITOT -- -- 1.5*  PROT -- -- 5.6*  ALBUMIN 2.3* 2.6* 2.9*  CBC:  Lab 04/20/12 0655 04/18/12 0525 04/17/12 1930 04/16/12 0353 04/15/12 2018  WBC 7.4 6.1 7.3 -- --  NEUTROABS -- -- -- 7.3 --  HGB 9.1* 9.6* 9.6* -- --  HCT 27.7* 29.0* 29.1* -- --  MCV 85.8 86.6 84.8 86.4 86.8  PLT 124* 118*  123* -- --    Lab 04/20/12 0424 04/20/12 0014 04/19/12 1952 04/19/12 1630 04/19/12 1204  GLUCAP 81 68* 84 72 90  Medications:    . sodium chloride 10 mL/hr (04/14/12 1600)      . antiseptic oral rinse  15 mL Mouth Rinse q12n4p  . chlorhexidine  15 mL Mouth Rinse BID  . darbepoetin (ARANESP) injection - DIALYSIS  100 mcg Intravenous Q Sat-HD  . famotidine  20 mg Oral Daily  . feeding supplement  1 Container Oral BID BM  . ferric gluconate (FERRLECIT/NULECIT) IV  62.5 mg Intravenous Q Tue-HD  . lanthanum  1,000 mg Oral TID WC  . multivitamin  1 tablet Oral Daily  . predniSONE  5 mg Oral Q breakfast

## 2012-04-20 NOTE — Progress Notes (Signed)
Noted order for [facility] needs, MD please specific needs. Will need order for HHPT/OT and HHRN if appropriate and face to face completed before [facility] can be arranged. Thanks Johny Shock RN MPH Case Manager [phone]

## 2012-04-20 NOTE — Progress Notes (Signed)
States he feels better with some flatus but no BM.  Just sipping a few CL. Remains distended and mildly tender on exam ? Persistent ileus vs SBO.  Will check abd xray I think he needs TNA  Mariella Saa MD, FACS  04/20/2012, 1:50 PM

## 2012-04-20 NOTE — Progress Notes (Signed)
Patient ID: NICOLUS OSE, male   DOB: [birth year], 51 y.o.   MRN: [id number] Patient ID: WILLI BOROWIAK, male   DOB: Dec 19, 1960, 51 y.o.   MRN: [id number] 5 Days Post-Op  Subjective:  Tolerating liquid diet ,+ flatus, no BM,  No c/o N/V.  Objective: Vital signs in last 24 hours: Temp:  [97.6 F (36.4 C)-98.8 F (37.1 C)] 97.8 F (36.6 C) (07/30 1056) Pulse Rate:  [91-108] 102  (07/30 1056) Resp:  [13-20] 18  (07/30 1056) BP: (137-165)/(75-87) 143/78 mmHg (07/30 1056) SpO2:  [93 %-100 %] 98 % (07/30 1056) Weight:  [105 lb 2.6 oz (47.7 kg)-106 lb 11.2 oz (48.4 kg)] 105 lb 2.6 oz (47.7 kg) (07/30 1037) Last BM Date: 04/18/12  Intake/Output from previous day: 07/29 0701 - 07/30 0700 In: 600 [P.O.:600] Out: -  Intake/Output this shift: Total I/O In: 0  Out: 1000 [Other:1000] General appearance: A/A/O,  Abdomen: distended, + flatus, tender over surgical site. Dsg C/DI.  Tolerating diet w/o N/V VSS afebrile.  Lab Results:   [facility] 04/20/12 0655 04/18/12 0525  WBC 7.4 6.1  HGB 9.1* 9.6*  HCT 27.7* 29.0*  PLT 124* 118*   BMET  Basename 04/20/12 0655 04/18/12 0525  NA 136 140  K 4.4 3.7  CL 96 99  CO2 25 30  GLUCOSE 76 74  BUN 60* 22  CREATININE 10.31* 4.62*  CALCIUM 8.6 8.8   PT/INR No results found for this basename: LABPROT:2,INR:2 in the last 72 hours ABG No results found for this basename: PHART:2,PCO2:2,PO2:2,HCO3:2 in the last 72 hours  Studies/Results: No results found.  Anti-infectives: Anti-infectives     Start     Dose/Rate Route Frequency Ordered Stop   04/14/12 0830   ertapenem (INVANZ) 1 g in sodium chloride 0.9 % 50 mL IVPB        1 g 100 mL/hr over 30 Minutes Intravenous To Surgery 04/14/12 0816 04/14/12 0834   04/13/12 1359   ertapenem (INVANZ) 1 g in sodium chloride 0.9 % 50 mL IVPB  Status:  Discontinued        1 g 100 mL/hr over 30 Minutes Intravenous 60 min pre-op 04/13/12 1359 04/14/12 0816          Assessment/Plan: s/p  Procedure(s) (LRB): THROMBECTOMY AND REVISION OF ARTERIOVENTOUS (AV) GORETEX  GRAFT (Left)  Continue current care Continue current diet for now.  May need TPN/ post pyloric tube feeds for increased caloric intake.( not eating much per nutrition) Continue PT/OT   LOS: 18 days    Katriana Dortch 04/20/2012

## 2012-04-20 NOTE — Progress Notes (Signed)
Nutrition Follow-up  Intervention:   1. Strongly recommend slow advancement of nutrition support (TPN vs EN.) Monitor magnesium, potassium, and phosphorus daily for at least 3 days, MD to replete as needed, as pt is at risk for refeeding syndrome given dx of severe malnutrition. 2. RD to continue to follow nutrition care plan  Assessment:   Pt has remained with either NPO or Clear Liquid diet order since last nutrition follow-up.   Thrombectomy and revision of AV graft on 7/25. TPN pharmacist completed initial assessment on 7/25 with plans to slow advance and monitor K, Mg, and Phos daily with the first 3 days on TPN d/t high refeeding risk; TPN was not initiated 2/2 issues with IV access. Per 7/26 pharmacy note, pt will need IR to place central access if TNA is to be started d/t occlusions. Plan was to start clear liquids s/p swallow eval. NGT d/c'd and clear liquid diet initiated 7/27.  Pt reported abdominal pressure and distention yesterday, preventing him from wanting to eat. Pt states that he does not like [employer] and is not drinking it. Discussed option of Prostat however pt declined, stating that he doesn't want anything that is going to "tear my stomach up."   Discussed need for nutrition support with MD, Regalado who deferred to surgery. This RD spoke with Surgical NP, Blenda Mounts regarding nutrition support. Discussed concerns with pt's suboptimal prolonged intake and risk for refeeding syndrome. NP stated he would consult surgeon once able. Appreciate coordination of care with surgical team.  Diet Order:  Clear Liquid Supplement: Resource Breeze PO BID  Meds: Scheduled Meds:   . antiseptic oral rinse  15 mL Mouth Rinse q12n4p  . chlorhexidine  15 mL Mouth Rinse BID  . darbepoetin (ARANESP) injection - DIALYSIS  100 mcg Intravenous Q Sat-HD  . famotidine  20 mg Oral Daily  . feeding supplement  1 Container Oral BID BM  . ferric gluconate (FERRLECIT/NULECIT) IV  62.5 mg  Intravenous Q Tue-HD  . lanthanum  1,000 mg Oral TID WC  . multivitamin  1 tablet Oral Daily  . predniSONE  5 mg Oral Q breakfast   Continuous Infusions:   . sodium chloride 10 mL/hr (04/14/12 1600)   PRN Meds:.acetaminophen, acetaminophen, albuterol, heparin, hydrALAZINE, morphine injection, ondansetron (ZOFRAN) IV, ondansetron, phenol, promethazine, DISCONTD: heparin  Labs:  CMP     Component Value Date/Time   NA 136 04/20/2012 0655   K 4.4 04/20/2012 0655   CL 96 04/20/2012 0655   CO2 25 04/20/2012 0655   GLUCOSE 76 04/20/2012 0655   BUN 60* 04/20/2012 0655   CREATININE 10.31* 04/20/2012 0655   CALCIUM 8.6 04/20/2012 0655   PROT 5.6* 04/16/2012 0353   ALBUMIN 2.3* 04/20/2012 0655   AST 26 04/16/2012 0353   ALT 10 04/16/2012 0353   ALKPHOS 39 04/16/2012 0353   BILITOT 1.5* 04/16/2012 0353   GFRNONAA 5* 04/20/2012 0655   GFRAA 6* 04/20/2012 0655   Phosphorus  Date/Time Value Range Status  04/20/2012  6:55 AM 4.7* 2.3 - 4.6 mg/dL Final  1/61/0960  4:54 PM 7.0* 2.3 - 4.6 mg/dL Final  0/98/1191  4:78 AM 4.3  2.3 - 4.6 mg/dL Final   Magnesium  Date/Time Value Range Status  04/16/2012  3:53 AM 1.9  1.5 - 2.5 mg/dL Final   Potassium  Date/Time Value Range Status  04/20/2012  6:55 AM 4.4  3.5 - 5.1 mEq/L Final  04/18/2012  5:25 AM 3.7  3.5 - 5.1 mEq/L Final  DELTA CHECK NOTED     DIALYSIS  04/17/2012  7:30 PM 4.5  3.5 - 5.1 mEq/L Final     Intake/Output Summary (Last 24 hours) at 04/20/12 1024 Last data filed at 04/20/12 0900  Gross per 24 hour  Intake    480 ml  Output      0 ml  Net    480 ml   Ht: 5\' 11"  (1.803 m)  Weight Status:  48.3 kg s/p HD on 7/27  Body mass index is 14.88 kg/(m^2). Underweight  Re-estimated needs:  1700 - 1950 kcal, 75 - 85 grams daily  Nutrition Dx:  Inadequate oral intake now R/T GI distress AEB pt report.  New Goal:  Initiation of TPN vs EN; intake to meet at least 90% of estimated needs  Monitor:  TPN vs EN initiation, weights, labs, diet  advancement  Jarold Motto MS, RD, LDN Pager: [phone] After-hours pager: [phone]

## 2012-04-20 NOTE — Progress Notes (Signed)
CBG:68  Treatment: 15 GM carbohydrate snack  Symptoms: None  Follow-up CBG: Time:0130 CBG Result:81  Possible Reasons for Event: Inadequate meal intake  Comments/MD notified:no    [name] C

## 2012-04-21 ENCOUNTER — Inpatient Hospital Stay (HOSPITAL_COMMUNITY): Payer: Medicare Other

## 2012-04-21 LAB — DIFFERENTIAL
Band Neutrophils: 0 % (ref 0–10)
Basophils Absolute: 0 10*3/uL (ref 0.0–0.1)
Basophils Relative: 0 % (ref 0–1)
Lymphocytes Relative: 9 % — ABNORMAL LOW (ref 12–46)
Lymphs Abs: 0.7 10*3/uL (ref 0.7–4.0)
Monocytes Absolute: 1.1 10*3/uL — ABNORMAL HIGH (ref 0.1–1.0)
Monocytes Relative: 15 % — ABNORMAL HIGH (ref 3–12)
WBC Morphology: INCREASED

## 2012-04-21 LAB — GLUCOSE, CAPILLARY
Glucose-Capillary: 86 mg/dL (ref 70–99)
Glucose-Capillary: 77 mg/dL (ref 70–99)
Glucose-Capillary: 94 mg/dL (ref 70–99)
Glucose-Capillary: 100 mg/dL — ABNORMAL HIGH (ref 70–99)

## 2012-04-21 LAB — COMPREHENSIVE METABOLIC PANEL
AST: 21 U/L (ref 0–37)
Albumin: 2.4 g/dL — ABNORMAL LOW (ref 3.5–5.2)
Alkaline Phosphatase: 61 U/L (ref 39–117)
BUN: 38 mg/dL — ABNORMAL HIGH (ref 6–23)
Chloride: 97 mEq/L (ref 96–112)
Potassium: 4.8 mEq/L (ref 3.5–5.1)
Total Bilirubin: 1.2 mg/dL (ref 0.3–1.2)

## 2012-04-21 LAB — CBC
HCT: 31.2 % — ABNORMAL LOW (ref 39.0–52.0)
Hemoglobin: 10.2 g/dL — ABNORMAL LOW (ref 13.0–17.0)
RBC: 3.58 MIL/uL — ABNORMAL LOW (ref 4.22–5.81)
WBC: 7.3 10*3/uL (ref 4.0–10.5)

## 2012-04-21 LAB — TRIGLYCERIDES: Triglycerides: 93 mg/dL (ref <150)

## 2012-04-21 LAB — PREALBUMIN: Prealbumin: 9.5 mg/dL — ABNORMAL LOW (ref 17.0–34.0)

## 2012-04-21 MED ORDER — BOOST / RESOURCE BREEZE PO LIQD: 1.0000 | ORAL | Status: DC

## 2012-04-21 MED ORDER — FAT EMULSION 20 % IV EMUL
250.0000 mL | INTRAVENOUS | Status: DC
  Filled 2012-04-21: qty 250

## 2012-04-21 MED ORDER — IOHEXOL 300 MG/ML  SOLN
50.0000 mL | Freq: Once | INTRAMUSCULAR | Status: AC | PRN
  Administered 2012-04-21: 5 mL via INTRAVENOUS

## 2012-04-21 MED ORDER — SODIUM CHLORIDE 0.9 % IV SOLN
125.0000 mg | INTRAVENOUS | Status: AC
  Administered 2012-04-22 – 2012-05-08 (×8): 125 mg via INTRAVENOUS
  Filled 2012-04-21 (×17): qty 10

## 2012-04-21 MED ORDER — CLINIMIX/DEXTROSE (5/15) 5 % IV SOLN
INTRAVENOUS | Status: DC
  Filled 2012-04-21: qty 1000

## 2012-04-21 MED ORDER — SODIUM CHLORIDE 0.9 % IV BOLUS (SEPSIS)
250.0000 mL | Freq: Once | INTRAVENOUS | Status: AC
  Administered 2012-04-21: 250 mL via INTRAVENOUS

## 2012-04-21 MED ORDER — HEPARIN SODIUM (PORCINE) 1000 UNIT/ML DIALYSIS
20.0000 [IU]/kg | INTRAMUSCULAR | Status: DC | PRN
  Administered 2012-04-23: 1000 [IU] via INTRAVENOUS_CENTRAL
  Filled 2012-04-21: qty 1

## 2012-04-21 NOTE — Progress Notes (Signed)
Nutrition Follow-up  Intervention:   1. Strongly recommend slow advancement of nutrition support (TPN vs EN.) Monitor magnesium, potassium, and phosphorus daily for at least 3 days, MD to replete as needed, as pt is at risk for refeeding syndrome given dx of severe malnutrition. 2. Decrease Breeze to daily 3. RD to continue to follow nutrition care plan  Assessment:   TPN pharmacist saw pt yesterday for initiation of TPN. Plan is to place PICC today, check AM labs and begin TPN. Pt denies n/v. Belly distended and uncomfortable. RN reports pt is stating that he is willing to try to take a [employer] today.  Diet Order:  Clear Liquid Supplement: Resource Breeze PO BID  Meds: Scheduled Meds:    . antiseptic oral rinse  15 mL Mouth Rinse q12n4p  . chlorhexidine  15 mL Mouth Rinse BID  . darbepoetin (ARANESP) injection - DIALYSIS  100 mcg Intravenous Q Sat-HD  . feeding supplement  1 Container Oral BID BM  . ferric gluconate (FERRLECIT/NULECIT) IV  125 mg Intravenous Q T,Th,Sa-HD  . lanthanum  1,000 mg Oral TID WC  . multivitamin  1 tablet Oral Daily  . pantoprazole  40 mg Oral BID AC  . predniSONE  5 mg Oral Q breakfast  . sodium chloride  250 mL Intravenous Once  . DISCONTD: ferric gluconate (FERRLECIT/NULECIT) IV  62.5 mg Intravenous Q Tue-HD   Continuous Infusions:    . sodium chloride 20 mL/hr at 04/21/12 0942   PRN Meds:.acetaminophen, acetaminophen, albuterol, heparin, hydrALAZINE, morphine injection, ondansetron (ZOFRAN) IV, ondansetron, phenol, promethazine, DISCONTD: heparin  Labs:  CMP     Component Value Date/Time   NA 141 04/21/2012 0550   K 4.8 04/21/2012 0550   CL 97 04/21/2012 0550   CO2 25 04/21/2012 0550   GLUCOSE 80 04/21/2012 0550   BUN 38* 04/21/2012 0550   CREATININE 6.70* 04/21/2012 0550   CALCIUM 8.6 04/21/2012 0550   PROT 5.9* 04/21/2012 0550   ALBUMIN 2.4* 04/21/2012 0550   AST 21 04/21/2012 0550   ALT 11 04/21/2012 0550   ALKPHOS 61 04/21/2012 0550   BILITOT 1.2 04/21/2012 0550   GFRNONAA 8* 04/21/2012 0550   GFRAA 10* 04/21/2012 0550   Phosphorus  Date/Time Value Range Status  04/21/2012  5:50 AM 5.0* 2.3 - 4.6 mg/dL Final  1/61/0960  4:54 AM 4.7* 2.3 - 4.6 mg/dL Final  0/98/1191  4:78 PM 7.0* 2.3 - 4.6 mg/dL Final   Magnesium  Date/Time Value Range Status  04/21/2012  5:50 AM 2.1  1.5 - 2.5 mg/dL Final  2/95/6213  0:86 AM 1.9  1.5 - 2.5 mg/dL Final   Potassium  Date/Time Value Range Status  04/21/2012  5:50 AM 4.8  3.5 - 5.1 mEq/L Final  04/20/2012  6:55 AM 4.4  3.5 - 5.1 mEq/L Final  04/18/2012  5:25 AM 3.7  3.5 - 5.1 mEq/L Final     DELTA CHECK NOTED     DIALYSIS     Intake/Output Summary (Last 24 hours) at 04/21/12 1027 Last data filed at 04/20/12 1700  Gross per 24 hour  Intake    180 ml  Output   1000 ml  Net   -820 ml  BM 7/28  Ht: 5\' 11"  (1.803 m)  Weight Status:  48.3 kg s/p HD on 7/27  Body mass index is 14.67 kg/(m^2). Underweight  Re-estimated needs:  1700 - 1950 kcal, 75 - 85 grams daily  Nutrition Dx:  Inadequate oral intake now R/T GI distress  AEB pt report. Ongoing.  New Goal:  Initiation of TPN vs EN; intake to meet at least 90% of estimated needs. Unmet, TPN has yet to be started.  Monitor:  TPN vs EN initiation, weights, labs, diet advancement  Jarold Motto MS, RD, LDN Pager: [phone] After-hours pager: [phone]

## 2012-04-21 NOTE — Progress Notes (Signed)
[facility] Progress Note  Subjective: Denies nausea or vomiting. Passing minimal gas.  Belly more distended. Uncomfortable.  Objective Filed Vitals:   04/20/12 1400 04/20/12 1800 04/20/12 2140 04/21/12 0409  BP: 138/80 140/75 164/79 166/78  Pulse: 101 109 99 102  Temp: 98.2 F (36.8 C) 98.6 F (37 C) 100.5 F (38.1 C) 98.8 F (37.1 C)  TempSrc: Oral Oral  Oral  Resp: 18 18 18 18   Height:      Weight:      SpO2: 98% 98% 98% 99%   Physical Exam General: NAD, emaciated  Heart: tachy rate 100 - 105- regular  Lungs: no wheezes or rales  Abdomen: soft more distended. Extremities: no LE edema  Dialysis Access: Left thigh graft patent  Dialysis Orders: Center: [facility] on TTS.  EDW 52.5 kg HD Bath 2K/2Ca Time 3.5 hrs Heparin 5000 U. Access AVG @ left thigh BFR 400 DFR 800 Zemplar 0 mcg IV/HD Epogen 0 Units IV/HD Venofer 50/week; last iPTH 314, but product high and no zemplar     Assessment/Plan:  1. SBO- s/p exp lap with small bowel resection x 2; POD # 7; x-ray yesterday suggestive of ileus, but partial SBO cannot be excluded; intake limited; Belly more distended today.  2. Fever - 100.5 last pm - no significant change in WBC 3. ESRD - HD TTS at [facility]; ; significant weight loss (5 kg) since adm and will need new eDW at time of discharge; ok to start tight heparin 4. Hypertension/volume - labile BP's; no BP meds at home;  BP up slightly and tachy - usual HR is in 60s at outpt dialysis; need to be sure he doesn't get too dry  5. Anemia - Hgb 10.1  Aranesp 100 started 7/28; Fe < 10 start full course of IV Fe; ferritin 552; 6. Metabolic bone disease - Fosrenol was resumed at 1gm po [facility] - but no P in CL, so nursing is holding for now; P 5 today 7. Protein calorie malnutrition-Alb 2.4; on clear liquids w ; encourage to drink small freq sips liquid; dislikes Resource; central PICC line to be placed to start TNA 8. S/p renal transplant - at [facility] in '87, started HD in 04/2003,  remains on Prednisone.       8.   Hepatitis C/thrombocytopenia- Plts up to 165K;           9.    Disposition- per primary; deconditioned and currently followed by PT/OT;   Sheffield Slider, PA-C [facility] Beeper [phone]  04/21/2012,9:36 AM  LOS: 19 days   Patient seen and examined and agree with assessment and plan as above.  Vinson Moselle  MD [facility] [phone] pgr    [phone] cell 04/21/2012, 11:39 AM     Additional Objective Labs: Basic Metabolic Panel:  Lab 04/21/12 2130 04/20/12 0655 04/18/12 0525 04/17/12 1930  NA 141 136 140 --  K 4.8 4.4 3.7 --  CL 97 96 99 --  CO2 25 25 30  --  GLUCOSE 80 76 74 --  BUN 38* 60* 22 --  CREATININE 6.70* 10.31* 4.62* --  CALCIUM 8.6 8.6 8.8 --  ALB -- -- -- --  PHOS 5.0* 4.7* -- 7.0*   Liver Function Tests:  Lab 04/21/12 0550 04/20/12 0655 04/17/12 1930 04/16/12 0353  AST 21 -- -- 26  ALT 11 -- -- 10  ALKPHOS 61 -- -- 39  BILITOT 1.2 -- -- 1.5*  PROT 5.9* -- -- 5.6*  ALBUMIN 2.4* 2.3* 2.6* --   CBC:  Lab 04/21/12 0550 03-May-2012 0655 04/18/12 0525 04/17/12 1930 04/16/12 0353  WBC 7.3 7.4 6.1 -- --  NEUTROABS 5.4 -- -- -- 7.3  HGB 10.2* 9.1* 9.6* -- --  HCT 31.2* 27.7* 29.0* -- --  MCV 87.2 85.8 86.6 84.8 86.4  PLT 165 124* 118* -- --  CBG:  Lab 04/21/12 0811 04/21/12 0406 05-03-12 2351 05-03-2012 1949 05/03/12 1639  GLUCAP 86 86 87 95 83   Iron Studies:  Basename 05/03/12 0655  IRON <10*  TIBC Not calculated due to Iron <10.  TRANSFERRIN --  FERRITIN 552*   Studies/Results: Dg Abd 2 Views  03-May-2012  *RADIOLOGY REPORT*  Clinical Data: Postop abdominal distention, recent of abdominal surgery for small bowel obstruction and lysis of the patient with partial small bowel resection  ABDOMEN - 2 VIEW  Comparison: Portable abdomen film of 04/16/2012  Findings: Both large and small bowel gas is present which may indicate postoperative ileus.  However there does appear be more distention  of small bowel loops and a partial small bowel obstruction cannot be excluded.  Some contrast is scattered throughout the bowel.  Arterial calcifications are noted.  IMPRESSION: Distention of both large and small bowel may indicate ileus, but a partial small bowel obstruction cannot be excluded.  Original Report Authenticated By: Juline Patch, M.D.   Medications:    . sodium chloride 250 mL (04/21/12 0424)      . antiseptic oral rinse  15 mL Mouth Rinse q12n4p  . chlorhexidine  15 mL Mouth Rinse BID  . darbepoetin (ARANESP) injection - DIALYSIS  100 mcg Intravenous Q Sat-HD  . feeding supplement  1 Container Oral BID BM  . ferric gluconate (FERRLECIT/NULECIT) IV  62.5 mg Intravenous Q Tue-HD  . lanthanum  1,000 mg Oral TID WC  . multivitamin  1 tablet Oral Daily  . pantoprazole  40 mg Oral BID AC  . predniSONE  5 mg Oral Q breakfast  . sodium chloride  250 mL Intravenous Once

## 2012-04-21 NOTE — Progress Notes (Signed)
Patient ID: [name], male   DOB: 05/07/1961, 51 y.o.   MRN: [id number] Patient ID: [name], male   DOB: 19-May-1961, 37 y.o.   MRN: [id number] Patient ID: [name], male   DOB: July 26, 1961, 51 y.o.   MRN: [id number] 6 Days Post-Op  Subjective:  Tolerating liquid diet ,+ flatus, no BM,  No c/o N/V.  Objective: Vital signs in last 24 hours: Temp:  [97.6 F (36.4 C)-100.5 F (38.1 C)] 98.8 F (37.1 C) (07/31 0409) Pulse Rate:  [99-109] 102  (07/31 0409) Resp:  [14-19] 18  (07/31 0409) BP: (138-166)/(75-87) 166/78 mmHg (07/31 0409) SpO2:  [96 %-99 %] 99 % (07/31 0409) Weight:  [105 lb 2.6 oz (47.7 kg)] 105 lb 2.6 oz (47.7 kg) 05/07/2023 1037) Last BM Date: 04/18/12  Intake/Output from previous day: 05/07/2023 0701 - 07/31 0700 In: 180 [P.O.:180] Out: 1000  Intake/Output this shift:   General appearance: A/A/O,  Abdomen: remains slightly distended, + BS and  flatus,  appropriately tender over surgical site. Dsg C/DI.  Tolerating diet w/o N/V VSS afebrile.  Lab Results:   Basename 04/21/12 0550 2012/05/06 0655  WBC 7.3 7.4  HGB 10.2* 9.1*  HCT 31.2* 27.7*  PLT 165 124*   BMET  Basename 04/21/12 0550 May 06, 2012 0655  NA 141 136  K 4.8 4.4  CL 97 96  CO2 25 25  GLUCOSE 80 76  BUN 38* 60*  CREATININE 6.70* 10.31*  CALCIUM 8.6 8.6   PT/INR No results found for this basename: LABPROT:2,INR:2 in the last 72 hours ABG No results found for this basename: PHART:2,PCO2:2,PO2:2,HCO3:2 in the last 72 hours  Studies/Results: Dg Abd 2 Views  May 06, 2012  *RADIOLOGY REPORT*  Clinical Data: Postop abdominal distention, recent of abdominal surgery for small bowel obstruction and lysis of the patient with partial small bowel resection  ABDOMEN - 2 VIEW  Comparison: Portable abdomen film of 04/16/2012  Findings: Both large and small bowel gas is present which may indicate postoperative ileus.  However there does appear be more distention of small bowel loops and a partial small bowel  obstruction cannot be excluded.  Some contrast is scattered throughout the bowel.  Arterial calcifications are noted.  IMPRESSION: Distention of both large and small bowel may indicate ileus, but a partial small bowel obstruction cannot be excluded.  Original Report Authenticated By: Juline Patch, M.D.    Anti-infectives: Anti-infectives     Start     Dose/Rate Route Frequency Ordered Stop   04/14/12 0830   ertapenem (INVANZ) 1 g in sodium chloride 0.9 % 50 mL IVPB        1 g 100 mL/hr over 30 Minutes Intravenous To Surgery 04/14/12 0816 04/14/12 0834   04/13/12 1359   ertapenem (INVANZ) 1 g in sodium chloride 0.9 % 50 mL IVPB  Status:  Discontinued        1 g 100 mL/hr over 30 Minutes Intravenous 60 min pre-op 04/13/12 1359 04/14/12 0816          Assessment/Plan: s/p Procedure(s) (LRB): THROMBECTOMY AND REVISION OF ARTERIOVENTOUS (AV) GORETEX  GRAFT (Left)  Continue current care Continue current diet for now.  Continue PT/OT Consult for IR to place centrally placed PICC requested; plan is for TNA once placed. Nutrition's note is seen and appreciated.  LOS: 19 days    [name] 04/21/2012

## 2012-04-21 NOTE — Progress Notes (Signed)
Physical Therapy Treatment Patient Details Name: [name] MRN: [id number] DOB: January 13, 1961 Today's Date: 04/21/2012 Time: 4098-1191 PT Time Calculation (min): 11 min  PT Assessment / Plan / Recommendation Comments on Treatment Session  Very slow progress, but pt is willing to participate if premedicated for pain; Hopefully with more nutritional support via TNA pt will be more able to work to wards progressive ambulation    Follow Up Recommendations  Home health PT;Supervision for mobility/OOB;LTACH (Possibly LTAC if TNA is unmanageable at home)    Barriers to Discharge        Equipment Recommendations  None recommended by PT    Recommendations for Other Services OT consult  Frequency Min 3X/week   Plan Discharge plan remains appropriate    Precautions / Restrictions Precautions Precautions: Fall   Pertinent Vitals/Pain Abdominal pain and bloating; did not rate    Mobility  Bed Mobility Bed Mobility: Rolling Right;Right Sidelying to Sit Rolling Right: 4: Min assist;With rail Right Sidelying to Sit: 4: Min assist;With rails Sitting - Scoot to Edge of Bed: 5: Supervision Details for Bed Mobility Assistance: Cues for rolling and sidelie to sit technique; continuing to move slowly because of pain Transfers Transfers: Sit to Stand;Stand to Sit Sit to Stand: From bed;With upper extremity assist;4: Min assist Stand to Sit: 4: Min guard;With upper extremity assist (to transporter's wheelchair) Details for Transfer Assistance: Cues for hand placement, safety Ambulation/Gait Ambulation/Gait Assistance: 4: Min guard (without physical contact) Ambulation Distance (Feet): 20 Feet Assistive device: Rolling walker Ambulation/Gait Assistance Details: Noted smoother, longer step length today compared to previous session Gait Pattern: Step-through pattern;Trunk flexed    Exercises     PT Diagnosis:    PT Problem List:   PT Treatment Interventions:     PT Goals Acute Rehab PT  Goals Time For Goal Achievement: 05/01/12 Potential to Achieve Goals: Good Pt will Roll Supine to Right Side: with modified independence PT Goal: Rolling Supine to Right Side - Progress: Progressing toward goal Pt will go Supine/Side to Sit: with modified independence PT Goal: Supine/Side to Sit - Progress: Progressing toward goal Pt will go Sit to Stand: with modified independence PT Goal: Sit to Stand - Progress: Progressing toward goal Pt will go Stand to Sit: with modified independence PT Goal: Stand to Sit - Progress: Progressing toward goal Pt will Ambulate: >150 feet;with modified independence;with least restrictive assistive device PT Goal: Ambulate - Progress: Progressing toward goal  Extremely slow progress  Visit Information  Last PT Received On: 04/21/12 Assistance Needed: +1    Subjective Data  Subjective: agreeable to amb to [facility] (there to transport pt to have PICC placed)   Cognition  Overall Cognitive Status: Appears within functional limits for tasks assessed/performed Arousal/Alertness: Awake/alert Orientation Level: Appears intact for tasks assessed Behavior During Session: [facility] for tasks performed    Balance     End of Session PT - End of Session Activity Tolerance: Patient limited by pain Patient left: Other (comment) (in wheelchair t be transported to Int. Rad) Nurse Communication: Mobility status   GP     Van Clines Hamff 04/21/2012, 3:58 PM

## 2012-04-21 NOTE — Progress Notes (Signed)
PARENTERAL NUTRITION CONSULT NOTE - FOLLOW UP  Pharmacy Consult for TPN Indication: ileus, enteral feed intolerance  Allergies  Allergen Reactions  . Allopurinol     REACTION: decreased platelets  . Aspirin     REACTION: unspecified    Patient Measurements: Height: 5\' 11"  (180.3 cm) Weight: 105 lb 2.6 oz (47.7 kg) IBW/kg (Calculated) : 75.3   Vital Signs: Temp: 98.8 F (37.1 C) (07/31 1000) Temp src: Oral (07/31 1000) BP: 176/73 mmHg (07/31 1000) Pulse Rate: 109  (07/31 1000) Intake/Output from previous day: 07/30 0701 - 07/31 0700 In: 180 [P.O.:180] Out: 1000  Intake/Output from this shift: Total I/O In: 120 [P.O.:120] Out: -   Labs:  [facility] 04/21/12 0550 04/20/12 0655  WBC 7.3 7.4  HGB 10.2* 9.1*  HCT 31.2* 27.7*  PLT 165 124*  APTT -- --  INR -- --     [facility] 04/21/12 0550 04/20/12 0655  NA 141 136  K 4.8 4.4  CL 97 96  CO2 25 25  GLUCOSE 80 76  BUN 38* 60*  CREATININE 6.70* 10.31*  LABCREA -- --  CREAT24HRUR -- --  CALCIUM 8.6 8.6  MG 2.1 --  PHOS 5.0* 4.7*  PROT 5.9* --  ALBUMIN 2.4* 2.3*  AST 21 --  ALT 11 --  ALKPHOS 61 --  BILITOT 1.2 --  BILIDIR -- --  IBILI -- --  PREALBUMIN 9.5* --  TRIG 93 --  CHOLHDL -- --  CHOL 102 --   Estimated Creatinine Clearance: 8.7 ml/min (by C-G formula based on Cr of 6.7).    Basename 04/21/12 1151 04/21/12 0811 04/21/12 0406  GLUCAP 77 86 86    Medical History: Past Medical History  Diagnosis Date  . Dialysis patient   . Prostate cancer     Medications:  Infusions:     . sodium chloride 20 mL/hr at 04/21/12 0942    Insulin Requirements in the past 24 hours:  none  Current Nutrition:  CL diet  Assessment: Mr. Cozzolino was admitted 7/13 with abd pain, nausea/vomiting and 5 Kg weight loss in 6 days. Patient was made NPO for abd CT that was done on admission. Patient initially had an NGT for decompression, which was then taken out when he tolerated clamping (7/19). He was then  started on a clear liquid diet, which could not be advanced d/t intermittent cramping. Pt ultimately taken to OR for exp lap, LOA and SBR of chronic SBO (7/24). Bowel function has not resumed and patient requires TPN for severe malnutrition.   Nutritional Goals:  1700-1950 kCal, 75-85 grams of protein per day  Plan:  Will begin Clinimix 5/15 at 81ml/hr and titrate to goal of 48ml/hr. Starting rate will provide 50% of nutritional requirements. Will begin lipids at 10ml/hr and titrate accordingly. No need for MVI as patient is taking daily renal vitamin. Patient at risk for refeeding syndrome. CBG ordered q4h but cbg have been <150 while on prednisone. Will f/u with am labs.   Verlene Mayer, PharmD, BCPS Pager [phone] 04/21/2012,12:50 PM

## 2012-04-21 NOTE — Progress Notes (Signed)
Patient ID: [name]  male  ZOX:[id number]    DOB: 04-Nov-1960    DOA: 04/02/2012  PCP: Trevor Iha, MD  51 y/o male with ESRD who was admitted on 7/13 with SBO, underwent lysis of adhesions and partial small bowel resection x2 on 7/24 and later developed a clot in his L femoral AV graft requiring thrombectomy on 7/25. PCCM consulted for vent management. [facility] assume care 7-28.   Consultants:  Dr. Zacarias Pontes surgery  Dr. Powell/nephrology  Dr. Callie Fielding surgery  Dr. Borden/urology   Procedures/Events:  7/13 Admission  7/13 CT Abdomen with SBO  7/24 Ex-lap, lysis of adhesions, partial small bowel resection x2  7/25 Thrombectomy L femoral AV graft  7/26 Extubated, CVL is out   Antibiotics:  Invanz IV 7/23>>>7/24    Subjective: Patient denies any nausea vomiting, abdominal tenderness with distention noticed, appears to be uncomfortable, appears emaciated. No chest pain or shortness of breath.  Objective: Weight change: -0.7 kg (-1 lb 8.7 oz)  Intake/Output Summary (Last 24 hours) at 04/21/12 1259 Last data filed at 04/21/12 0900  Gross per 24 hour  Intake    300 ml  Output      0 ml  Net    300 ml   Blood pressure 176/73, pulse 109, temperature 98.8 F (37.1 C), temperature source Oral, resp. rate 18, height 5\' 11"  (1.803 m), weight 47.7 kg (105 lb 2.6 oz), SpO2 98.00%.  Physical Exam: General: Alert and awake, oriented x3, not in any acute distress. HEENT: anicteric sclera, pupils reactive to light and accommodation, EOMI CVS: S1-S2 clear, no murmur rubs or gallops Chest: clear to auscultation bilaterally, no wheezing, rales or rhonchi Abdomen: soft dressing intact, +distended, mildly tender,  no organomegaly Extremities: no cyanosis, clubbing or edema noted bilaterally   Lab Results: Basic Metabolic Panel:  Lab 04/21/12 8119 04/20/12 0655  NA 141 136  K 4.8 4.4  CL 97 96  CO2 25 25  GLUCOSE 80 76  BUN 38* 60*  CREATININE 6.70* 10.31*    CALCIUM 8.6 8.6  MG 2.1 --  PHOS 5.0* --   Liver Function Tests:  Lab 04/21/12 0550 04/20/12 0655 04/16/12 0353  AST 21 -- 26  ALT 11 -- 10  ALKPHOS 61 -- 39  BILITOT 1.2 -- 1.5*  PROT 5.9* -- 5.6*  ALBUMIN 2.4* 2.3* --  CBC:  Lab 04/21/12 0550 04/20/12 0655  WBC 7.3 7.4  NEUTROABS 5.4 --  HGB 10.2* 9.1*  HCT 31.2* 27.7*  MCV 87.2 85.8  PLT 165 124*   CBG:  Lab 04/21/12 1151 04/21/12 0811 04/21/12 0406 04/20/12 2351 04/20/12 1949  GLUCAP 77 86 86 87 95     Micro Results: No results found for this or any previous visit (from the past 240 hour(s)).  Studies/Results: Ct Abdomen Pelvis Wo Contrast  04/03/2012  *RADIOLOGY REPORT*  Clinical Data: Abdominal pain  CT ABDOMEN AND PELVIS WITHOUT CONTRAST  Technique:  Multidetector CT imaging of the abdomen and pelvis was performed following the standard protocol without intravenous contrast.  Comparison: 11/01/2004  Findings: Coronary artery calcification.  Linear opacity at the left lung base.  Organ abnormality/lesion detection is limited in the absence of intravenous contrast. Within this limitation, unremarkable liver, biliary system, spleen, adrenal glands. There is dilatation of the main pancreatic duct, measuring up to 5 mm.  Atrophic native kidneys with bilateral cystic lesions of varying complexity.  Transplant kidney left lower quadrant with sinus lipomatosis and cortical thinning.  There is a loop  of small bowel distended up to 3.3 cm and demonstrating air fluid level.  Distal small bowel is decompressed.  Colonic diverticulosis.  No CT evidence for diverticulitis  No free intraperitoneal air or fluid.  No lymphadenopathy.  Advanced atherosclerotic disease of the aorta and branch vessels.  Decompressed bladder.  Surgical clips in the pelvis.  Partially imaged left femoral bypass.  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 Unchanged cystic and sclerotic changes of the right femoral neck.  No acute osseous finding.   IMPRESSION: Dilated small bowel loops up to 3.3 cm with air-fluid levels. Decompressed bowel loops distally.  This may reflect an early small bowel obstruction.  Recommend correlation with symptoms and if no intervention, follow-up KUB to document passage of contrast into the colon.  Advanced atherosclerotic disease.  Transplant kidney left lower quadrant with prominent renal sinus fat, similar to prior.  Bilateral native kidneys with incompletely characterized cystic lesions of varying complexity.  Consider follow-up renal MRI.  Dilated main pancreatic duct up to 5 mm.  No obstructing lesion visualized by CT.  This can also be better characterized at the time of MR.  Original Report Authenticated By: Waneta Martins, M.D.   Dg Abd 1 View  04/12/2012  *RADIOLOGY REPORT*  Clinical Data: Abdominal pain, constipation, nausea and vomiting  ABDOMEN - 1 VIEW  Comparison: 04/11/2012; 04/08/2012; 04/06/2012; chest CT of 04/03/2012  Findings:  No change to minimal decrease in gaseous distension of several loops of small bowel with index loop within the right mid hemiabdomen measuring approximately 3.9 cm in diameter.  Minimal transit of previously ingested oral contrast with enteric contrast now seen within the splenic flexure of the colon. Evaluation pneumoperitoneum is limited secondary to supine patient positioning.  No definite pneumatosis or portal venous gas.  Vascular calcifications overlie the left upper abdominal quadrant. Multiple surgical clips overlie the pelvis.  Grossly unchanged bones.  IMPRESSION: No change to minimal decrease in findings compatible with partial small bowel obstruction.  Original Report Authenticated By: Waynard Reeds, M.D.   Dg Abd 1 View  04/11/2012  *RADIOLOGY REPORT*  Clinical Data:  small bowel obstruction  ABDOMEN - 1 VIEW  Comparison: 04/08/2012  Findings: Persistent small bowel dilatation in the left mid abdomen measuring 4.6 cm in diameter.  Retained contrast and stool in  the the right colon.  Minimal improvement compared to 04/08/2012. Postop changes of the pelvis.  Vascular calcifications present.  IMPRESSION: Some improvement in partial small bowel obstruction pattern.  Original Report Authenticated By: Judie Petit. Ruel Favors, M.D.   Abd 1 View (kub)  04/05/2012  *RADIOLOGY REPORT*  Clinical Data: Follow-up small bowel obstruction.  ABDOMEN - 1 VIEW  Comparison: 04/03/2012  Findings: Dilated lower abdominal small bowel loops are again noted compatible with small bowel obstruction.  Likely no significant change since prior CT.  No free air.  Surgical clips in the pelvis. No acute bony abnormality.  IMPRESSION: Stable small bowel dilatation in the lower abdomen, likely no significant change.  Original Report Authenticated By: Cyndie Chime, M.D.   Dg Chest Port 1 View  04/15/2012  *RADIOLOGY REPORT*  Clinical Data: History of endotracheal tube placement.  History of line placement.  PORTABLE CHEST - 1 VIEW  Comparison: 04/07/2011.  Findings: Tip of endotracheal tube terminates 7 cm above the carina.  Enteric tube is in place with distal portion extending into the upper left abdomen.  Tip is not included on the image. Left internal jugular venous catheter tip terminates  in left axillary region.  There is no evidence of a pneumothorax.  No pulmonary infiltrates are evident.  No pleural effusion is seen. Cardiac silhouette is borderline in size.  On the previous study there is a catheter entering from the inferior vena cava approach. This catheter is no longer evident.  IMPRESSION: Endotracheal tube in place with tip above carina.  Enteric tube in place.  Left internal jugular venous catheter tip terminates in the left axillary region.  There is a note given in the history that Dr. Noreene Larsson was aware of the position of this catheter with no plan to reposition the catheter. No pneumothorax is evident.  Original Report Authenticated By: Crawford Givens, M.D.   Dg Abd 2 Views  04/20/2012   *RADIOLOGY REPORT*  Clinical Data: Postop abdominal distention, recent of abdominal surgery for small bowel obstruction and lysis of the patient with partial small bowel resection  ABDOMEN - 2 VIEW  Comparison: Portable abdomen film of 04/16/2012  Findings: Both large and small bowel gas is present which may indicate postoperative ileus.  However there does appear be more distention of small bowel loops and a partial small bowel obstruction cannot be excluded.  Some contrast is scattered throughout the bowel.  Arterial calcifications are noted.  IMPRESSION: Distention of both large and small bowel may indicate ileus, but a partial small bowel obstruction cannot be excluded.  Original Report Authenticated By: Juline Patch, M.D.   Dg Abd 2 Views  04/13/2012  *RADIOLOGY REPORT*  Clinical Data: Abdominal pain, weakness.  ABDOMEN - 2 VIEW  Comparison: Abdominal radiograph 04/12/2012.  Findings: There is some gas, stool and residual oral contrast material noted within the colon.  There is a paucity of distal colonic and rectal gas. Visualized portions of the colon do not appear dilated.  Several dilated loops of small bowel are seen in the upper abdomen, measuring up to 4.2 cm in diameter.  No gross evidence of pneumoperitoneum is noted on this limited supine view of the abdomen.  Multiple surgical clips are seen throughout the pelvis.  Numerous vascular calcifications are noted.  IMPRESSION: 1.  The bowel gas pattern is again most consistent with a partial small-bowel obstruction, as above.  The appearance is very similar to the recent prior examination.  Original Report Authenticated By: Florencia Reasons, M.D.   Dg Abd 2 Views  04/08/2012  *RADIOLOGY REPORT*  Clinical Data: Bowel obstruction, abdominal pain  ABDOMEN - 2 VIEW  Comparison: 04/06/2012  Findings: Tip of nasogastric tube in distal esophagus, recommend advancing tube 10 cm. Persistent dilatation of small bowel loops up to 4.2 cm diameter. No bowel  wall thickening or free peritoneal air. Lung bases appear clear. Gas and stool are present within the colon. Surgical clips in pelvis. Scattered atherosclerotic calcifications. Bones appear demineralized with mild degenerative changes of the hip joints.  IMPRESSION: Persistent dilatation of small bowel loops though more gas and stool are present within the colon, consistent with partial small bowel obstruction. Recommend advancing nasogastric tube 10 cm.  Findings called to [facility] RN in Hemodialysis, where patient is currently located, on 04/08/2012 at 0805 hours.  Original Report Authenticated By: Lollie Marrow, M.D.   Dg Abd 2 Views  04/06/2012  *RADIOLOGY REPORT*  Clinical Data: Small bowel obstruction.  Abdominal pain.  ABDOMEN - 2 VIEW  Comparison: Radiographs dated 04/05/2012 and CT scan dated 04/03/2012  Findings: There is persistent dilatation of multiple small bowel loops.  The contrast given for the prior CT  scan remains in these dilated small bowel loops.  No contrast in the nondistended colon.  No free air in the upright radiograph.  IMPRESSION: Persistent small bowel obstruction, unchanged.  Original Report Authenticated By: Gwynn Burly, M.D.   Dg Abd Portable 1v  04/16/2012  *RADIOLOGY REPORT*  Clinical Data: No bowel activity since exploratory laparoscopy 2 days ago question ileus, right lower quadrant pain  PORTABLE ABDOMEN - 1 VIEW  Comparison: 04/13/2012  Findings: Nasogastric tube in stomach. Retained contrast in colon. Surgical clips in pelvis bilaterally. Scattered atherosclerotic calcifications. No bowel dilatation, bowel wall thickening, or evidence of obstruction. Bowel staple lines noted in right mid abdomen.  IMPRESSION: Nonobstructive bowel gas pattern. Resolution of small bowel distention since prior exam.  Original Report Authenticated By: Lollie Marrow, M.D.    Medications: Scheduled Meds:   . antiseptic oral rinse  15 mL Mouth Rinse q12n4p  . chlorhexidine  15 mL  Mouth Rinse BID  . darbepoetin (ARANESP) injection - DIALYSIS  100 mcg Intravenous Q Sat-HD  . feeding supplement  1 Container Oral Q24H  . ferric gluconate (FERRLECIT/NULECIT) IV  125 mg Intravenous Q T,Th,Sa-HD  . lanthanum  1,000 mg Oral TID WC  . multivitamin  1 tablet Oral Daily  . pantoprazole  40 mg Oral BID AC  . predniSONE  5 mg Oral Q breakfast  . sodium chloride  250 mL Intravenous Once  . DISCONTD: feeding supplement  1 Container Oral BID BM  . DISCONTD: ferric gluconate (FERRLECIT/NULECIT) IV  62.5 mg Intravenous Q Tue-HD   Continuous Infusions:   . sodium chloride 20 mL/hr at 04/21/12 1610     Assessment/Plan: Principal Problem:  *SBO (small bowel obstruction) s/p EL/LOA and SBR x 2 with postoperative ileus - Primary management per surgical team, on liquid diet, PICC line to be placed for TNA today - Postop day #7  Leg graft occlusion  Acute occlusion of dialysis leg graft, patient s/p thrombectomy 7/25 L fem AV graft.   CKD (chronic kidney disease) stage V requiring chronic dialysis - Renal following, hemodialysis TTS   Thrombocytopenia: Improving   Gout: Stable   HTN (hypertension): Stable  Severe malnutrition: Continue liquid diet, PICC line to be placed for starting TNA   Acute respiratory failure with hypoxia currently stable:  - Patient had developed post operative respiratory failure 7-25 after AV thrombectomy, remained intubated and was exhibited on 04/16/2012. Acute respiratory failure after the surgery was probably related to COPD/Emphysema/ sedatives.    Sinus tachycardia with NSVT: Place on telemetry, monitor electrolytes  DVT Prophylaxis:  Code Status:Full   Disposition:Will await surgery recommendations, however given the multiple comorbid issues, likely to require prolonged TNA,  ESRD on HD, possibly a candidate for LTAC.    LOS: 19 days   Marah Park M.D. [facility] 04/21/2012, 12:59 PM Pager: [phone]  If 7PM-7AM,  please contact night-coverage [email or web address] Password [email or web address]

## 2012-04-21 NOTE — Procedures (Signed)
Patient was found to have Bilateral upper venous occlusion. IJ venous line was not placed.

## 2012-04-21 NOTE — Progress Notes (Addendum)
Pt discussed in unit progression meeting and per request of Dr. Isidoro Donning, referral made to [facility] , [facility] called to review pt.  Johny Shock RN MPH Case Manager [phone]  Per [facility] rep pt would be appropriate for admission, bed not available today, possibly tomorrow. Johny Shock RN MPH

## 2012-04-22 ENCOUNTER — Encounter (HOSPITAL_COMMUNITY): Payer: Self-pay | Admitting: Anesthesiology

## 2012-04-22 ENCOUNTER — Inpatient Hospital Stay (HOSPITAL_COMMUNITY): Payer: Medicare Other

## 2012-04-22 ENCOUNTER — Inpatient Hospital Stay (HOSPITAL_COMMUNITY): Payer: Medicare Other | Admitting: Anesthesiology

## 2012-04-22 ENCOUNTER — Encounter (HOSPITAL_COMMUNITY): Payer: Self-pay | Admitting: Surgery

## 2012-04-22 ENCOUNTER — Encounter (HOSPITAL_COMMUNITY)
Admission: EM | Disposition: A | Discharge status: Skilled Nursing Facility | Payer: Self-pay | Source: Home / Self Care | Attending: Internal Medicine

## 2012-04-22 DIAGNOSIS — Z8719 Personal history of other diseases of the digestive system: Secondary | ICD-10-CM

## 2012-04-22 DIAGNOSIS — N186 End stage renal disease: Secondary | ICD-10-CM

## 2012-04-22 HISTORY — DX: Personal history of other diseases of the digestive system: Z87.19

## 2012-04-22 HISTORY — PX: LAPAROTOMY: SHX154

## 2012-04-22 LAB — CBC
HCT: 29.1 % — ABNORMAL LOW (ref 39.0–52.0)
Hemoglobin: 9.6 g/dL — ABNORMAL LOW (ref 13.0–17.0)
MCH: 28.3 pg (ref 26.0–34.0)
MCHC: 33 g/dL (ref 30.0–36.0)
MCV: 85.8 fL (ref 78.0–100.0)
Platelets: 172 10*3/uL (ref 150–400)
RBC: 3.39 MIL/uL — ABNORMAL LOW (ref 4.22–5.81)
RDW: 13.8 % (ref 11.5–15.5)
WBC: 9 10*3/uL (ref 4.0–10.5)

## 2012-04-22 LAB — COMPREHENSIVE METABOLIC PANEL
ALT: 10 U/L (ref 0–53)
AST: 17 U/L (ref 0–37)
Albumin: 2.2 g/dL — ABNORMAL LOW (ref 3.5–5.2)
Alkaline Phosphatase: 60 U/L (ref 39–117)
BUN: 69 mg/dL — ABNORMAL HIGH (ref 6–23)
CO2: 23 mEq/L (ref 19–32)
Calcium: 8.6 mg/dL (ref 8.4–10.5)
Chloride: 96 mEq/L (ref 96–112)
Creatinine, Ser: 9.45 mg/dL — ABNORMAL HIGH (ref 0.50–1.35)
GFR calc Af Amer: 6 mL/min — ABNORMAL LOW (ref >90)
GFR calc non Af Amer: 6 mL/min — ABNORMAL LOW (ref >90)
Glucose, Bld: 95 mg/dL (ref 70–99)
Potassium: 5.3 mEq/L — ABNORMAL HIGH (ref 3.5–5.1)
Sodium: 140 mEq/L (ref 135–145)
Total Bilirubin: 1 mg/dL (ref 0.3–1.2)
Total Protein: 5.7 g/dL — ABNORMAL LOW (ref 6.0–8.3)

## 2012-04-22 LAB — PHOSPHORUS: Phosphorus: 6.4 mg/dL — ABNORMAL HIGH (ref 2.3–4.6)

## 2012-04-22 LAB — GLUCOSE, CAPILLARY: Glucose-Capillary: 79 mg/dL (ref 70–99)

## 2012-04-22 LAB — MAGNESIUM: Magnesium: 2.2 mg/dL (ref 1.5–2.5)

## 2012-04-22 SURGERY — LAPAROTOMY, EXPLORATORY
Anesthesia: General | Site: Abdomen | Wound class: Dirty or Infected

## 2012-04-22 MED ORDER — LIDOCAINE HCL (CARDIAC) 20 MG/ML IV SOLN
INTRAVENOUS | Status: DC | PRN
  Administered 2012-04-22: 70 mg via INTRAVENOUS

## 2012-04-22 MED ORDER — BOOST / RESOURCE BREEZE PO LIQD: 1.0000 | ORAL | Status: DC

## 2012-04-22 MED ORDER — GENTAMICIN SULFATE 40 MG/ML IJ SOLN
INTRAMUSCULAR | Status: DC | PRN
  Administered 2012-04-22: 240 mg

## 2012-04-22 MED ORDER — SUCCINYLCHOLINE CHLORIDE 20 MG/ML IJ SOLN
INTRAMUSCULAR | Status: DC | PRN
  Administered 2012-04-22: 100 mg via INTRAVENOUS

## 2012-04-22 MED ORDER — SODIUM CHLORIDE 0.9 % IR SOLN
Freq: Once | Status: DC
  Filled 2012-04-22: qty 1000

## 2012-04-22 MED ORDER — MIDAZOLAM HCL 5 MG/5ML IJ SOLN
INTRAMUSCULAR | Status: DC | PRN
  Administered 2012-04-22: 2 mg via INTRAVENOUS

## 2012-04-22 MED ORDER — ROCURONIUM BROMIDE 100 MG/10ML IV SOLN
INTRAVENOUS | Status: DC | PRN
  Administered 2012-04-22: 10 mg via INTRAVENOUS
  Administered 2012-04-22: 25 mg via INTRAVENOUS
  Administered 2012-04-22: 5 mg via INTRAVENOUS
  Administered 2012-04-22: 10 mg via INTRAVENOUS

## 2012-04-22 MED ORDER — PIPERACILLIN-TAZOBACTAM 3.375 G IVPB
3.3750 g | Freq: Once | INTRAVENOUS | Status: AC
  Administered 2012-04-22: 3.375 g via INTRAVENOUS
  Filled 2012-04-22: qty 50

## 2012-04-22 MED ORDER — PIPERACILLIN-TAZOBACTAM 3.375 G IVPB 30 MIN
3.3750 g | Freq: Three times a day (TID) | INTRAVENOUS | Status: DC
  Filled 2012-04-22 (×2): qty 50

## 2012-04-22 MED ORDER — PIPERACILLIN-TAZOBACTAM IN DEX 2-0.25 GM/50ML IV SOLN
2.2500 g | Freq: Three times a day (TID) | INTRAVENOUS | Status: DC
  Administered 2012-04-23 – 2012-05-20 (×79): 2.25 g via INTRAVENOUS
  Filled 2012-04-22 (×91): qty 50

## 2012-04-22 MED ORDER — PIPERACILLIN-TAZOBACTAM 3.375 G IVPB 30 MIN
INTRAVENOUS | Status: DC | PRN
  Administered 2012-04-22: 3.375 g via INTRAVENOUS

## 2012-04-22 MED ORDER — SODIUM CHLORIDE 0.9 % IV SOLN
INTRAVENOUS | Status: DC | PRN
  Administered 2012-04-22 (×2): via INTRAVENOUS

## 2012-04-22 MED ORDER — FENTANYL CITRATE 0.05 MG/ML IJ SOLN
INTRAMUSCULAR | Status: DC | PRN
  Administered 2012-04-22 (×3): 100 ug via INTRAVENOUS
  Administered 2012-04-22: 50 ug via INTRAVENOUS
  Administered 2012-04-22 (×2): 100 ug via INTRAVENOUS
  Administered 2012-04-22 (×2): 50 ug via INTRAVENOUS
  Administered 2012-04-22: 100 ug via INTRAVENOUS

## 2012-04-22 MED ORDER — CLINDAMYCIN PHOSPHATE 300 MG/2ML IJ SOLN
INTRAMUSCULAR | Status: DC | PRN
  Administered 2012-04-22: 900 mg

## 2012-04-22 MED ORDER — NEPRO/CARBSTEADY PO LIQD
237.0000 mL | Freq: Two times a day (BID) | ORAL | Status: DC
  Administered 2012-04-22: 237 mL via ORAL
  Filled 2012-04-22 (×2): qty 237

## 2012-04-22 MED ORDER — BOOST / RESOURCE BREEZE PO LIQD: 1.0000 | Freq: Three times a day (TID) | ORAL | Status: DC

## 2012-04-22 MED ORDER — 0.9 % SODIUM CHLORIDE (POUR BTL) OPTIME
TOPICAL | Status: DC | PRN
  Administered 2012-04-22: 1000 mL
  Administered 2012-04-22: 5000 mL
  Administered 2012-04-22 (×2): 1000 mL

## 2012-04-22 MED ORDER — ETOMIDATE 2 MG/ML IV SOLN
INTRAVENOUS | Status: DC | PRN
  Administered 2012-04-22: 20 mg via INTRAVENOUS

## 2012-04-22 MED ORDER — ALBUMIN HUMAN 5 % IV SOLN
INTRAVENOUS | Status: DC | PRN
  Administered 2012-04-22 (×2): via INTRAVENOUS

## 2012-04-22 SURGICAL SUPPLY — 47 items
BLADE SURG ROTATE 9660 (MISCELLANEOUS) IMPLANT
CANISTER SUCTION 2500CC (MISCELLANEOUS) ×2 IMPLANT
CHLORAPREP W/TINT 26ML (MISCELLANEOUS) ×1 IMPLANT
CLOTH BEACON ORANGE TIMEOUT ST (SAFETY) ×2 IMPLANT
COVER SURGICAL LIGHT HANDLE (MISCELLANEOUS) ×2 IMPLANT
DRAPE LAPAROSCOPIC ABDOMINAL (DRAPES) ×2 IMPLANT
DRAPE WARM FLUID 44X44 (DRAPE) ×2 IMPLANT
DRSG PAD ABDOMINAL 8X10 ST (GAUZE/BANDAGES/DRESSINGS) ×1 IMPLANT
ELECT BLADE 6.5 EXT (BLADE) ×1 IMPLANT
ELECT CAUTERY BLADE 6.4 (BLADE) ×2 IMPLANT
ELECT REM PT RETURN 9FT ADLT (ELECTROSURGICAL) ×2
ELECTRODE REM PT RTRN 9FT ADLT (ELECTROSURGICAL) ×1 IMPLANT
GLOVE BIOGEL PI IND STRL 8 (GLOVE) ×1 IMPLANT
GLOVE BIOGEL PI INDICATOR 8 (GLOVE) ×1
GLOVE ECLIPSE 8.0 STRL XLNG CF (GLOVE) ×2 IMPLANT
GOWN PREVENTION PLUS XLARGE (GOWN DISPOSABLE) ×2 IMPLANT
GOWN STRL NON-REIN LRG LVL3 (GOWN DISPOSABLE) ×5 IMPLANT
KIT BASIN OR (CUSTOM PROCEDURE TRAY) ×2 IMPLANT
KIT ROOM TURNOVER OR (KITS) ×2 IMPLANT
LIGASURE IMPACT 36 18CM CVD LR (INSTRUMENTS) IMPLANT
NS IRRIG 1000ML POUR BTL (IV SOLUTION) ×4 IMPLANT
PACK GENERAL/GYN (CUSTOM PROCEDURE TRAY) ×2 IMPLANT
PAD ARMBOARD 7.5X6 YLW CONV (MISCELLANEOUS) ×4 IMPLANT
SEALER TISSUE X1 CVD JAW (INSTRUMENTS) ×1 IMPLANT
SPECIMEN JAR X LARGE (MISCELLANEOUS) IMPLANT
SPONGE GAUZE 4X4 12PLY (GAUZE/BANDAGES/DRESSINGS) ×1 IMPLANT
SPONGE LAP 18X18 X RAY DECT (DISPOSABLE) IMPLANT
STAPLER 90 3.5 STAND SLIM (STAPLE) ×2
STAPLER 90 3.5 STD SLIM (STAPLE) IMPLANT
STAPLER PROXIMATE 75MM BLUE (STAPLE) ×1 IMPLANT
STAPLER TA90 4.8 THK SLIMI (STAPLE) ×1 IMPLANT
STAPLER VISISTAT 35W (STAPLE) ×2 IMPLANT
SUCTION POOLE TIP (SUCTIONS) ×2 IMPLANT
SUT PDS AB 1 CT  36 (SUTURE) ×4
SUT PDS AB 1 CT 36 (SUTURE) IMPLANT
SUT PDS AB 1 TP1 96 (SUTURE) ×4 IMPLANT
SUT SILK 0 TIES 10X30 (SUTURE) IMPLANT
SUT SILK 2 0 SH CR/8 (SUTURE) ×2 IMPLANT
SUT SILK 2 0 TIES 10X30 (SUTURE) ×2 IMPLANT
SUT SILK 3 0 SH CR/8 (SUTURE) ×2 IMPLANT
SUT SILK 3 0 TIES 10X30 (SUTURE) ×2 IMPLANT
TOWEL OR 17X24 6PK STRL BLUE (TOWEL DISPOSABLE) ×2 IMPLANT
TOWEL OR 17X26 10 PK STRL BLUE (TOWEL DISPOSABLE) ×2 IMPLANT
TRAY FOLEY CATH 14FRSI W/METER (CATHETERS) ×1 IMPLANT
TROCAR FALLER TUNNELING (TROCAR) ×2 IMPLANT
WATER STERILE IRR 1000ML POUR (IV SOLUTION) IMPLANT
YANKAUER SUCT BULB TIP NO VENT (SUCTIONS) ×1 IMPLANT

## 2012-04-22 NOTE — Anesthesia Preprocedure Evaluation (Addendum)
Anesthesia Evaluation  Patient identified by MRN, date of birth, ID band Patient awake    Reviewed: Allergy & Precautions, H&P , NPO status , Patient's Chart, lab work & pertinent test results  History of Anesthesia Complications Negative for: history of anesthetic complications  Airway Mallampati: I TM Distance: >3 FB Neck ROM: Full    Dental  (+) Teeth Intact and Dental Advisory Given   Pulmonary  S/p respiratory failure, extubated  7/25  breath sounds clear to auscultation        Cardiovascular Exercise Tolerance: Poor hypertension, Pt. on medications + dysrhythmias Supra Ventricular Tachycardia Rhythm:Regular Rate:Normal     Neuro/Psych    GI/Hepatic Neg liver ROS, S/p ex lap lysis of adhesions, bowel resection. Now with leaking bowel    Endo/Other  negative endocrine ROS  Renal/GU CRF and DialysisRenal disease     Musculoskeletal   Abdominal   Peds  Hematology   Anesthesia Other Findings   Reproductive/Obstetrics                          Anesthesia Physical Anesthesia Plan  ASA: IV and Emergent  Anesthesia Plan: General   Post-op Pain Management:    Induction: Intravenous, Rapid sequence and Cricoid pressure planned  Airway Management Planned: Oral ETT  Additional Equipment:   Intra-op Plan:   Post-operative Plan: Possible Post-op intubation/ventilation  Informed Consent:   Dental advisory given  Plan Discussed with: CRNA, Anesthesiologist and Surgeon  Anesthesia Plan Comments:        Anesthesia Quick Evaluation

## 2012-04-22 NOTE — Progress Notes (Addendum)
ANTIBIOTIC CONSULT NOTE - INITIAL  Pharmacy Consult for Zosyn Indication: peritonitis  Allergies  Allergen Reactions  . Allopurinol     REACTION: decreased platelets  . Aspirin     REACTION: unspecified    Patient Measurements: Height: 5\' 11"  (180.3 cm) Weight: 105 lb 2.6 oz (47.7 kg) IBW/kg (Calculated) : 75.3   Vital Signs: Temp: 97.3 F (36.3 C) (08/01 1754) Temp src: Oral (08/01 1754) BP: 136/78 mmHg (08/01 1754) Pulse Rate: 101  (08/01 1754) Intake/Output from previous day: 07/31 0701 - 08/01 0700 In: 849 [P.O.:390; I.V.:459] Out: -  Intake/Output from this shift:    Labs:  Basename 04/22/12 0636 04/21/12 0550 04/20/12 0655  WBC 9.0 7.3 7.4  HGB 9.6* 10.2* 9.1*  PLT 172 165 124*  LABCREA -- -- --  CREATININE 9.45* 6.70* 10.31*   Estimated Creatinine Clearance: 6.2 ml/min (by C-G formula based on Cr of 9.45). No results found for this basename: VANCOTROUGH:2,VANCOPEAK:2,VANCORANDOM:2,GENTTROUGH:2,GENTPEAK:2,GENTRANDOM:2,TOBRATROUGH:2,TOBRAPEAK:2,TOBRARND:2,AMIKACINPEAK:2,AMIKACINTROU:2,AMIKACIN:2, in the last 72 hours   Microbiology: Recent Results (from the past 720 hour(s))  MRSA PCR SCREENING     Status: Normal   Collection Time   04/03/12 10:10 AM      Component Value Range Status Comment   MRSA by PCR NEGATIVE  NEGATIVE Final     Medical History: Past Medical History  Diagnosis Date  . Dialysis patient   . Prostate cancer     Assessment: 51 yo male with ESRD who was admitted on 7/13 with SBO, underwent lysis of adhesions and partial small bowel resection x2 on 7/24 and later developed a clot in his L femoral AV graft requiring thrombectomy on 7/25. Patient was taken to the OR tonight for exploratory laporotomy due to stool draining incision site. Pharmacy consulted to dose Zosyn.  No pre-op antibiotics documented as given prior to ex lap.  Plan:  -Zosyn 2.25 g IV q8h -Follow-up clinical course   [facility], [place].D., BCPS Clinical  Pharmacist Pager: [phone] 04/22/2012 9:33 PM

## 2012-04-22 NOTE — Preoperative (Addendum)
Beta Blockers   Reason not to administer Beta Blockers:Not Applicable 

## 2012-04-22 NOTE — Progress Notes (Signed)
Patient interviewed and examined, agree with NP note above. He has had a loose BM.  TNA not an option due to lack of venous access. Will try [place] and intake encouraged  Mariella Saa MD, FACS  04/22/2012 1:55 PM

## 2012-04-22 NOTE — Progress Notes (Signed)
PARENTERAL NUTRITION CONSULT NOTE - FOLLOW UP  Pharmacy Consult for TPN Indication: ileus, enteral feed intolerance  Allergies  Allergen Reactions  . Allopurinol     REACTION: decreased platelets  . Aspirin     REACTION: unspecified   Assessment: Mr. Depass was admitted 7/13 with abd pain, nausea/vomiting and 5 Kg weight loss in 6 days. Patient was made NPO for abd CT that was done on admission. Patient initially had an NGT for decompression, which was then taken out when he tolerated clamping (7/19). He was then started on a clear liquid diet, which could not be advanced d/t intermittent cramping. Pt ultimately taken to OR for exp lap, LOA and SBR of chronic SBO (7/24). Bowel function has not resumed and patient requires TPN for severe malnutrition. Advanced diet to full liquids today d/t pos BM and flatus. IR was unable to place PICC d/t b/l venous occlusions.   Nutritional Goals:  1700-1950 kCal, 75-85 grams of protein per day  Plan:  Will signoff, please reconsult pharmacy after central line has been placed. Thank you.  Verlene Mayer, PharmD, BCPS Pager [phone] 04/22/2012,12:29 PM  Patient Measurements: Height: 5\' 11"  (180.3 cm) Weight: 105 lb 2.6 oz (47.7 kg) IBW/kg (Calculated) : 75.3   Vital Signs: Temp: 96.9 F (36.1 C) (08/01 1040) Temp src: Oral (08/01 1040) BP: 131/82 mmHg (08/01 1040) Pulse Rate: 114  (08/01 1040) Intake/Output from previous day: 07/31 0701 - 08/01 0700 In: 849 [P.O.:390; I.V.:459] Out: -  Intake/Output from this shift:    Labs:  [facility] 04/22/12 0636 04/21/12 0550 04/20/12 0655  WBC 9.0 7.3 7.4  HGB 9.6* 10.2* 9.1*  HCT 29.1* 31.2* 27.7*  PLT 172 165 124*  APTT -- -- --  INR -- -- --     Basename 04/22/12 0636 04/21/12 0550 04/20/12 0655  NA 140 141 136  K 5.3* 4.8 4.4  CL 96 97 96  CO2 23 25 25   GLUCOSE 95 80 76  BUN 69* 38* 60*  CREATININE 9.45* 6.70* 10.31*  LABCREA -- -- --  CREAT24HRUR -- -- --  CALCIUM 8.6 8.6  8.6  MG 2.2 2.1 --  PHOS 6.4* 5.0* 4.7*  PROT 5.7* 5.9* --  ALBUMIN 2.2* 2.4* 2.3*  AST 17 21 --  ALT 10 11 --  ALKPHOS 60 61 --  BILITOT 1.0 1.2 --  BILIDIR -- -- --  IBILI -- -- --  PREALBUMIN -- 9.5* --  TRIG -- 93 --  CHOLHDL -- -- --  CHOL -- 102 --   Estimated Creatinine Clearance: 6.2 ml/min (by C-G formula based on Cr of 9.45).    Basename 04/22/12 0406 04/22/12 0009 04/21/12 2021  GLUCAP 95 106* 100*    Medical History: Past Medical History  Diagnosis Date  . Dialysis patient   . Prostate cancer     Medications:  Infusions:     . sodium chloride 20 mL/hr at 04/21/12 1905  . TPN (CLINIMIX) +/- additives     And  . fat emulsion      Insulin Requirements in the past 24 hours:  none  Current Nutrition:  CL diet

## 2012-04-22 NOTE — Progress Notes (Signed)
Patient ID: [name], male   DOB: 1960-11-18, 51 y.o.   MRN: [id number] Patient ID: [name], male   DOB: 1961-09-17, 63 y.o.   MRN: [id number] Patient ID: [name], male   DOB: 02/17/1961, 55 y.o.   MRN: [id number] Patient ID: [name], male   DOB: 07-13-1961, 69 y.o.   MRN: [id number] 7 Days Post-Op  Subjective:  Tolerating liquid diet ,+ flatus,  BM,  No c/o N/V. But still not taking in adequate amounts of calories.  Objective: Vital signs in last 24 hours: Temp:  [98 F (36.7 C)-100 F (37.8 C)] 99 F (37.2 C) (08/01 0634) Pulse Rate:  [101-122] 122  (08/01 0900) Resp:  [13-18] 17  (08/01 0900) BP: (114-176)/(73-87) 130/76 mmHg (08/01 0900) SpO2:  [95 %-100 %] 100 % (08/01 0800) Weight:  [103 lb (46.72 kg)-105 lb 2.6 oz (47.7 kg)] 105 lb 2.6 oz (47.7 kg) (08/01 0634) Last BM Date: 04/18/12  Intake/Output from previous day: 07/31 0701 - 08/01 0700 In: 849 [P.O.:390; I.V.:459] Out: -  Intake/Output this shift:   General appearance: A/A/O,  Abdomen: remains slightly distended, + BS and  Flatus, has had a BM,  appropriately tender over surgical site. Dsg C/DI.  Tolerating diet w/o N/V but still not taking in enough calories. tachycardiac, running low grade temp of 99.WBC has trended upward slightly.  Lab Results:   Basename 04/22/12 0636 04/21/12 0550  WBC 9.0 7.3  HGB 9.6* 10.2*  HCT 29.1* 31.2*  PLT 172 165   BMET  Basename 04/22/12 0636 04/21/12 0550  NA 140 141  K 5.3* 4.8  CL 96 97  CO2 23 25  GLUCOSE 95 80  BUN 69* 38*  CREATININE 9.45* 6.70*  CALCIUM 8.6 8.6   PT/INR No results found for this basename: LABPROT:2,INR:2 in the last 72 hours ABG No results found for this basename: PHART:2,PCO2:2,PO2:2,HCO3:2 in the last 72 hours  Studies/Results: Dg Abd 2 Views  April 26, 2012  *RADIOLOGY REPORT*  Clinical Data: Postop abdominal distention, recent of abdominal surgery for small bowel obstruction and lysis of the patient with partial small  bowel resection  ABDOMEN - 2 VIEW  Comparison: Portable abdomen film of 04/16/2012  Findings: Both large and small bowel gas is present which may indicate postoperative ileus.  However there does appear be more distention of small bowel loops and a partial small bowel obstruction cannot be excluded.  Some contrast is scattered throughout the bowel.  Arterial calcifications are noted.  IMPRESSION: Distention of both large and small bowel may indicate ileus, but a partial small bowel obstruction cannot be excluded.  Original Report Authenticated By: Juline Patch, M.D.    Anti-infectives: Anti-infectives     Start     Dose/Rate Route Frequency Ordered Stop   04/14/12 0830   ertapenem (INVANZ) 1 g in sodium chloride 0.9 % 50 mL IVPB        1 g 100 mL/hr over 30 Minutes Intravenous To Surgery 04/14/12 0816 04/14/12 0834   04/13/12 1359   ertapenem (INVANZ) 1 g in sodium chloride 0.9 % 50 mL IVPB  Status:  Discontinued        1 g 100 mL/hr over 30 Minutes Intravenous 60 min pre-op 04/13/12 1359 04/14/12 0816          Assessment/Plan: s/p Procedure(s) (LRB): THROMBECTOMY AND REVISION OF ARTERIOVENTOUS (AV) GORETEX  GRAFT (Left)  Continue current care Continue PT/OT Will upgrade diet to full liquid in an effort to increase  caloric intake. May need to get Abd films in am if belly remains distended and WBC continue to trend upward.    LOS: 20 days    Ioane Bhola 04/22/2012

## 2012-04-22 NOTE — Procedures (Signed)
I was present at this dialysis session. I have reviewed the session itself and made appropriate changes.   Vinson Moselle, MD [employer] 04/22/2012, 1:04 PM

## 2012-04-22 NOTE — Progress Notes (Signed)
Call into patient's room, patient found resting  in bed, alert and oriented x 3.  Large dark brown liquid stool noted oozing from lower abdominal incision site.  Clean dressing applied.  Call placed to Dr. Michaell Cowing, informed of stool oozing from incisional site.  BP 136/56 Telemetry showing ST 100. Will continue to monitor.

## 2012-04-22 NOTE — Progress Notes (Signed)
[name] [id number] 03/30/1961  CARE TEAM:  PCP: Trevor Iha, MD  Outpatient Care Team: Patient Care Team: Trevor Iha, MD as PCP - General (Nephrology)  Inpatient Treatment Team: Treatment Team: Attending Provider: Cathren Harsh, MD; Rounding Team: Bishop Limbo, MD; Consulting Physician: Hartley Barefoot. Allena Katz, MD; Registered Nurse: Willene Hatchet, RN; Registered Nurse: Algis Liming, RN; Rounding Team: Joanna Hews, MD; Registered Nurse: Christell Faith, RN; Registered Nurse: Monia Sabal, RN  Subjective:  Called by nurse 20 minutes ago.  She noted green and brown thick contents were draining from the patient's incision.  Patient had had flatus and a large bowel movement a day but now is having more pain.  Tachycardic (He has been intermittently tachycardic over the past week, but 110s tonight).  Feeling miserable. Patient thinks the drainage started in dialysis earlier today...   Objective:  Vital signs:  Filed Vitals:   04/22/12 1030 04/22/12 1040 04/22/12 1500 04/22/12 1754  BP: 124/81 131/82 136/83 136/78  Pulse: 122 114 106 101  Temp:  96.9 F (36.1 C) 98.1 F (36.7 C) 97.3 F (36.3 C)  TempSrc:  Oral Oral Oral  Resp: 17 16 18 20   Height:      Weight:      SpO2:    96%    Last BM Date: 04/22/12  Intake/Output   Yesterday:  07/31 0701 - 08/01 0700 In: 849 [P.O.:390; I.V.:459] Out: -  This shift:     Bowel function:  Flatus: y  BM: y  Physical Exam:  General: Pt awake/alert/oriented x4 in moderate acute distress Eyes: PERRL, normal EOM.  Sclera clear.  No icterus Neuro: CN II-XII intact w/o focal sensory/motor deficits. Lymph: No head/neck/groin lymphadenopathy Psych:  No delerium/psychosis/paranoia HENT: Normocephalic, Mucus membranes moist.  No thrush Neck: Supple, No tracheal deviation Chest: No chest wall pain w good excursion CV:  Pulses intact.  Regular rhythm Abdomen:  Moderately distended but soft.  Very tender at  incision & RLQ>LLQ.  2cm dehiscence in center with brown thick output & pinhole brown output at lowest corner of incision as well.  ++ pain w cough / peritonitis. Ext:  SCDs BLE.  No mjr edema.  No cyanosis Skin: No petechiae / purpurae  Problem List:  Principal Problem:  *SBO (small bowel obstruction) s/p EL/LOA and SBR x 2 Active Problems:  CKD (chronic kidney disease) stage V requiring chronic dialysis  Erythrocytosis  Thrombocytopenia  Gout  HTN (hypertension)  Ileus following gastrointestinal surgery  Chronic use of steroids  Malnutrition, calorie  Leg graft occlusion  Acute respiratory failure with hypoxia  Sinus tachycardia   Assessment  [name]  51 y.o. male     Procedure(s): EXPLORATORY LAPAROTOMY  Brown drainage from midline wound strongly suspicious for fecalized succus versus stool.  Peritonitis in multiple locations postop day #8 open LOA &  bowel resection x2.  Renal failure chronically malnourished.  Plan:  Unfortunately this is a very difficult situation.  Ideally I would like to stay out of the abdomen since he is very malnourished and deconditioned.  POD#8 from prior surgery will mean a lot of inflammation in the abdomen with increased risk for serosal injury or fistula formation.    However he has peritonitis and he has stool draining from multiple locations.  I'm worried he has significant contamination that draining will not control.  His life is acutely threatened.  Therefore I recommended exploratory laparotomy.  Repair of small bowel leak versus resection/repair of new  leaking source.  [place] of contamination:  The anatomy & physiology of the digestive tract was discussed.  The pathophysiology of perforation was discussed.  Differential diagnosis such as perforated ulcer or colon, etc was discussed.   Natural history risks without surgery such as death was discussed.  I recommended abdominal exploration to diagnose & treat the source of the  problem.  Open techniques were discussed.   Risks such as bleeding, infection, abscess, leak, reoperation, bowel resection, possible ostomy, hernia, heart attack, death, and other risks were discussed.   The risks of no intervention will lead to serious problems including death.   I expressed a good likelihood that surgery will address the problem.    Goals of post-operative recovery were discussed as well.  We will work to minimize complications although risks in an emergent setting are high.   Questions were answered.  The patient expressed understanding & wishes to proceed with surgery.      Discussed with my partner, Dr. Carolynne Edouard, whom agrees.  Ardeth Sportsman, M.D., F.A.C.S. Gastrointestinal and Minimally Invasive Surgery [facility] [address] [place], [place] [address] [phone] Main / Paging [phone] Voice Mail   04/22/2012  Results:   Labs: Results for orders placed during the hospital encounter of 04/02/12 (from the past 48 hour(s))  GLUCOSE, CAPILLARY     Status: Normal   Collection Time   04/20/12 11:51 PM      Component Value Range Comment   Glucose-Capillary 87  70 - 99 mg/dL   GLUCOSE, CAPILLARY     Status: Normal   Collection Time   04/21/12  4:06 AM      Component Value Range Comment   Glucose-Capillary 86  70 - 99 mg/dL   COMPREHENSIVE METABOLIC PANEL     Status: Abnormal   Collection Time   04/21/12  5:50 AM      Component Value Range Comment   Sodium 141  135 - 145 mEq/L    Potassium 4.8  3.5 - 5.1 mEq/L    Chloride 97  96 - 112 mEq/L    CO2 25  19 - 32 mEq/L    Glucose, Bld 80  70 - 99 mg/dL    BUN 38 (*) 6 - 23 mg/dL DELTA CHECK NOTED   Creatinine, Ser 6.70 (*) 0.50 - 1.35 mg/dL DELTA CHECK NOTED   Calcium 8.6  8.4 - 10.5 mg/dL    Total Protein 5.9 (*) 6.0 - 8.3 g/dL    Albumin 2.4 (*) 3.5 - 5.2 g/dL    AST 21  0 - 37 U/L    ALT 11  0 - 53 U/L    Alkaline Phosphatase 61  39 - 117 U/L    Total Bilirubin 1.2   0.3 - 1.2 mg/dL    GFR calc non Af Amer 8 (*) >90 mL/min    GFR calc Af Amer 10 (*) >90 mL/min   PREALBUMIN     Status: Abnormal   Collection Time   04/21/12  5:50 AM      Component Value Range Comment   Prealbumin 9.5 (*) 17.0 - 34.0 mg/dL   MAGNESIUM     Status: Normal   Collection Time   04/21/12  5:50 AM      Component Value Range Comment   Magnesium 2.1  1.5 - 2.5 mg/dL   PHOSPHORUS     Status: Abnormal   Collection Time   04/21/12  5:50 AM  Component Value Range Comment   Phosphorus 5.0 (*) 2.3 - 4.6 mg/dL   CHOLESTEROL, TOTAL     Status: Normal   Collection Time   04/21/12  5:50 AM      Component Value Range Comment   Cholesterol 102  0 - 200 mg/dL   TRIGLYCERIDES     Status: Normal   Collection Time   04/21/12  5:50 AM      Component Value Range Comment   Triglycerides 93  <150 mg/dL   CBC     Status: Abnormal   Collection Time   04/21/12  5:50 AM      Component Value Range Comment   WBC 7.3  4.0 - 10.5 K/uL    RBC 3.58 (*) 4.22 - 5.81 MIL/uL    Hemoglobin 10.2 (*) 13.0 - 17.0 g/dL    HCT 40.9 (*) 81.1 - 52.0 %    MCV 87.2  78.0 - 100.0 fL    MCH 28.5  26.0 - 34.0 pg    MCHC 32.7  30.0 - 36.0 g/dL    RDW 91.4  78.2 - 95.6 %    Platelets 165  150 - 400 K/uL   DIFFERENTIAL     Status: Abnormal   Collection Time   04/21/12  5:50 AM      Component Value Range Comment   Neutrophils Relative 75  43 - 77 %    Lymphocytes Relative 9 (*) 12 - 46 %    Monocytes Relative 15 (*) 3 - 12 %    Eosinophils Relative 1  0 - 5 %    Basophils Relative 0  0 - 1 %    Band Neutrophils 0  0 - 10 %    Metamyelocytes Relative 0      Myelocytes 0      Promyelocytes Absolute 0      Blasts 0      nRBC 0  0 /100 WBC    Neutro Abs 5.4  1.7 - 7.7 K/uL    Lymphs Abs 0.7  0.7 - 4.0 K/uL    Monocytes Absolute 1.1 (*) 0.1 - 1.0 K/uL    Eosinophils Absolute 0.1  0.0 - 0.7 K/uL    Basophils Absolute 0.0  0.0 - 0.1 K/uL    WBC Morphology INCREASED BANDS (>20% BANDS)     GLUCOSE, CAPILLARY      Status: Normal   Collection Time   04/21/12  8:11 AM      Component Value Range Comment   Glucose-Capillary 86  70 - 99 mg/dL   GLUCOSE, CAPILLARY     Status: Normal   Collection Time   04/21/12 11:51 AM      Component Value Range Comment   Glucose-Capillary 77  70 - 99 mg/dL   GLUCOSE, CAPILLARY     Status: Normal   Collection Time   04/21/12  4:49 PM      Component Value Range Comment   Glucose-Capillary 94  70 - 99 mg/dL   GLUCOSE, CAPILLARY     Status: Abnormal   Collection Time   04/21/12  8:21 PM      Component Value Range Comment   Glucose-Capillary 100 (*) 70 - 99 mg/dL   GLUCOSE, CAPILLARY     Status: Abnormal   Collection Time   04/22/12 12:09 AM      Component Value Range Comment   Glucose-Capillary 106 (*) 70 - 99 mg/dL   GLUCOSE, CAPILLARY     Status: Normal  Collection Time   04/22/12  4:06 AM      Component Value Range Comment   Glucose-Capillary 95  70 - 99 mg/dL   COMPREHENSIVE METABOLIC PANEL     Status: Abnormal   Collection Time   04/22/12  6:36 AM      Component Value Range Comment   Sodium 140  135 - 145 mEq/L    Potassium 5.3 (*) 3.5 - 5.1 mEq/L    Chloride 96  96 - 112 mEq/L    CO2 23  19 - 32 mEq/L    Glucose, Bld 95  70 - 99 mg/dL    BUN 69 (*) 6 - 23 mg/dL    Creatinine, Ser 7.82 (*) 0.50 - 1.35 mg/dL    Calcium 8.6  8.4 - 95.6 mg/dL    Total Protein 5.7 (*) 6.0 - 8.3 g/dL    Albumin 2.2 (*) 3.5 - 5.2 g/dL    AST 17  0 - 37 U/L    ALT 10  0 - 53 U/L    Alkaline Phosphatase 60  39 - 117 U/L    Total Bilirubin 1.0  0.3 - 1.2 mg/dL    GFR calc non Af Amer 6 (*) >90 mL/min    GFR calc Af Amer 6 (*) >90 mL/min   PHOSPHORUS     Status: Abnormal   Collection Time   04/22/12  6:36 AM      Component Value Range Comment   Phosphorus 6.4 (*) 2.3 - 4.6 mg/dL   CBC     Status: Abnormal   Collection Time   04/22/12  6:36 AM      Component Value Range Comment   WBC 9.0  4.0 - 10.5 K/uL    RBC 3.39 (*) 4.22 - 5.81 MIL/uL    Hemoglobin 9.6 (*) 13.0 - 17.0  g/dL    HCT 21.3 (*) 08.6 - 52.0 %    MCV 85.8  78.0 - 100.0 fL    MCH 28.3  26.0 - 34.0 pg    MCHC 33.0  30.0 - 36.0 g/dL    RDW 57.8  46.9 - 62.9 %    Platelets 172  150 - 400 K/uL   MAGNESIUM     Status: Normal   Collection Time   04/22/12  6:36 AM      Component Value Range Comment   Magnesium 2.2  1.5 - 2.5 mg/dL   GLUCOSE, CAPILLARY     Status: Normal   Collection Time   04/22/12 12:20 PM      Component Value Range Comment   Glucose-Capillary 79  70 - 99 mg/dL   GLUCOSE, CAPILLARY     Status: Normal   Collection Time   04/22/12  5:20 PM      Component Value Range Comment   Glucose-Capillary 93  70 - 99 mg/dL    Comment 1 Notify RN      Comment 2 Documented in Chart       Imaging / Studies: No results found.  Medications / Allergies: per chart  Antibiotics: Anti-infectives     Start     Dose/Rate Route Frequency Ordered Stop   04/14/12 0830   ertapenem (INVANZ) 1 g in sodium chloride 0.9 % 50 mL IVPB        1 g 100 mL/hr over 30 Minutes Intravenous To Surgery 04/14/12 0816 04/14/12 0834   04/13/12 1359   ertapenem (INVANZ) 1 g in sodium chloride 0.9 % 50 mL IVPB  Status:  Discontinued        1 g 100 mL/hr over 30 Minutes Intravenous 60 min pre-op 04/13/12 1359 04/14/12 0816

## 2012-04-22 NOTE — Progress Notes (Signed)
Patient ID: [name]  male  ZOX:[id number]    DOB: 12-10-1960    DOA: 04/02/2012  PCP: Stephen Iha, MD  51 y/o male with ESRD who was admitted on 7/13 with SBO, underwent lysis of adhesions and partial small bowel resection x2 on 7/24 and later developed a clot in his L femoral AV graft requiring thrombectomy on 7/25. PCCM consulted for vent management. [facility] assume care 7-28.   Consultants:  Dr. Zacarias Pontes surgery  Dr. Powell/nephrology  Dr. Callie Fielding surgery  Dr. Borden/urology   Procedures/Events:  7/13 Admission  7/13 CT Abdomen with SBO  7/24 Ex-lap, lysis of adhesions, partial small bowel resection x2  7/25 Thrombectomy L femoral AV graft  7/26 Extubated, CVL is out   Antibiotics:  Invanz IV 7/23>>>7/24    Subjective: Large BM in hemodialysis, unable to place access for TNA, diet advanced today  Objective: Weight change: -0.979 kg (-2 lb 2.6 oz)  Intake/Output Summary (Last 24 hours) at 04/22/12 1603 Last data filed at 04/22/12 1040  Gross per 24 hour  Intake    490 ml  Output      0 ml  Net    490 ml   Blood pressure 131/82, pulse 114, temperature 96.9 F (36.1 C), temperature source Oral, resp. rate 16, height 5\' 11"  (1.803 m), weight 47.7 kg (105 lb 2.6 oz), SpO2 100.00%.  Physical Exam: General: Alert and awake, oriented, emaciated HEENT: anicteric sclera, pupils reactive to light and accommodation, EOMI CVS: S1-S2 clear, no murmur rubs or gallops Chest: clear to auscultation bilaterally, no wheezing, rales or rhonchi Abdomen: soft dressing intact, +distended, mildly tender,  no organomegaly Extremities: no cyanosis, clubbing or edema noted bilaterally   Lab Results: Basic Metabolic Panel:  Lab 04/22/12 8119 04/21/12 0550  NA 140 141  K 5.3* 4.8  CL 96 97  CO2 23 25  GLUCOSE 95 80  BUN 69* 38*  CREATININE 9.45* 6.70*  CALCIUM 8.6 8.6  MG 2.2 --  PHOS 6.4* --   Liver Function Tests:  Lab 04/22/12 0636 04/21/12 0550  AST  17 21  ALT 10 11  ALKPHOS 60 61  BILITOT 1.0 1.2  PROT 5.7* 5.9*  ALBUMIN 2.2* 2.4*  CBC:  Lab 04/22/12 0636 04/21/12 0550  WBC 9.0 7.3  NEUTROABS -- 5.4  HGB 9.6* 10.2*  HCT 29.1* 31.2*  MCV 85.8 87.2  PLT 172 165   CBG:  Lab 04/22/12 1220 04/22/12 0406 04/22/12 0009 04/21/12 2021 04/21/12 1649  GLUCAP 79 95 106* 100* 94     Micro Results: No results found for this or any previous visit (from the past 240 hour(s)).  Studies/Results: Ct Abdomen Pelvis Wo Contrast  04/03/2012  *RADIOLOGY REPORT*  Clinical Data: Abdominal pain  CT ABDOMEN AND PELVIS WITHOUT CONTRAST  Technique:  Multidetector CT imaging of the abdomen and pelvis was performed following the standard protocol without intravenous contrast.  Comparison: 11/01/2004  Findings: Coronary artery calcification.  Linear opacity at the left lung base.  Organ abnormality/lesion detection is limited in the absence of intravenous contrast. Within this limitation, unremarkable liver, biliary system, spleen, adrenal glands. There is dilatation of the main pancreatic duct, measuring up to 5 mm.  Atrophic native kidneys with bilateral cystic lesions of varying complexity.  Transplant kidney left lower quadrant with sinus lipomatosis and cortical thinning.  There is a loop of small bowel distended up to 3.3 cm and demonstrating air fluid level.  Distal small bowel is decompressed.  Colonic diverticulosis.  No  CT evidence for diverticulitis  No free intraperitoneal air or fluid.  No lymphadenopathy.  Advanced atherosclerotic disease of the aorta and branch vessels.  Decompressed bladder.  Surgical clips in the pelvis.  Partially imaged left femoral bypass.  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 Unchanged cystic and sclerotic changes of the right femoral neck.  No acute osseous finding.  IMPRESSION: Dilated small bowel loops up to 3.3 cm with air-fluid levels. Decompressed bowel loops distally.  This may reflect an early small  bowel obstruction.  Recommend correlation with symptoms and if no intervention, follow-up KUB to document passage of contrast into the colon.  Advanced atherosclerotic disease.  Transplant kidney left lower quadrant with prominent renal sinus fat, similar to prior.  Bilateral native kidneys with incompletely characterized cystic lesions of varying complexity.  Consider follow-up renal MRI.  Dilated main pancreatic duct up to 5 mm.  No obstructing lesion visualized by CT.  This can also be better characterized at the time of MR.  Original Report Authenticated By: Waneta Martins, M.D.   Dg Abd 1 View  04/12/2012  *RADIOLOGY REPORT*  Clinical Data: Abdominal pain, constipation, nausea and vomiting  ABDOMEN - 1 VIEW  Comparison: 04/11/2012; 04/08/2012; 04/06/2012; chest CT of 04/03/2012  Findings:  No change to minimal decrease in gaseous distension of several loops of small bowel with index loop within the right mid hemiabdomen measuring approximately 3.9 cm in diameter.  Minimal transit of previously ingested oral contrast with enteric contrast now seen within the splenic flexure of the colon. Evaluation pneumoperitoneum is limited secondary to supine patient positioning.  No definite pneumatosis or portal venous gas.  Vascular calcifications overlie the left upper abdominal quadrant. Multiple surgical clips overlie the pelvis.  Grossly unchanged bones.  IMPRESSION: No change to minimal decrease in findings compatible with partial small bowel obstruction.  Original Report Authenticated By: Waynard Reeds, M.D.   Dg Abd 1 View  04/11/2012  *RADIOLOGY REPORT*  Clinical Data:  small bowel obstruction  ABDOMEN - 1 VIEW  Comparison: 04/08/2012  Findings: Persistent small bowel dilatation in the left mid abdomen measuring 4.6 cm in diameter.  Retained contrast and stool in the the right colon.  Minimal improvement compared to 04/08/2012. Postop changes of the pelvis.  Vascular calcifications present.  IMPRESSION:  Some improvement in partial small bowel obstruction pattern.  Original Report Authenticated By: Judie Petit. Ruel Favors, M.D.   Abd 1 View (kub)  04/05/2012  *RADIOLOGY REPORT*  Clinical Data: Follow-up small bowel obstruction.  ABDOMEN - 1 VIEW  Comparison: 04/03/2012  Findings: Dilated lower abdominal small bowel loops are again noted compatible with small bowel obstruction.  Likely no significant change since prior CT.  No free air.  Surgical clips in the pelvis. No acute bony abnormality.  IMPRESSION: Stable small bowel dilatation in the lower abdomen, likely no significant change.  Original Report Authenticated By: Cyndie Chime, M.D.   Dg Chest Port 1 View  04/15/2012  *RADIOLOGY REPORT*  Clinical Data: History of endotracheal tube placement.  History of line placement.  PORTABLE CHEST - 1 VIEW  Comparison: 04/07/2011.  Findings: Tip of endotracheal tube terminates 7 cm above the carina.  Enteric tube is in place with distal portion extending into the upper left abdomen.  Tip is not included on the image. Left internal jugular venous catheter tip terminates in left axillary region.  There is no evidence of a pneumothorax.  No pulmonary infiltrates are evident.  No pleural effusion is seen.  Cardiac silhouette is borderline in size.  On the previous study there is a catheter entering from the inferior vena cava approach. This catheter is no longer evident.  IMPRESSION: Endotracheal tube in place with tip above carina.  Enteric tube in place.  Left internal jugular venous catheter tip terminates in the left axillary region.  There is a note given in the history that Dr. Noreene Larsson was aware of the position of this catheter with no plan to reposition the catheter. No pneumothorax is evident.  Original Report Authenticated By: Crawford Givens, M.D.   Dg Abd 2 Views  04/20/2012  *RADIOLOGY REPORT*  Clinical Data: Postop abdominal distention, recent of abdominal surgery for small bowel obstruction and lysis of the patient  with partial small bowel resection  ABDOMEN - 2 VIEW  Comparison: Portable abdomen film of 04/16/2012  Findings: Both large and small bowel gas is present which may indicate postoperative ileus.  However there does appear be more distention of small bowel loops and a partial small bowel obstruction cannot be excluded.  Some contrast is scattered throughout the bowel.  Arterial calcifications are noted.  IMPRESSION: Distention of both large and small bowel may indicate ileus, but a partial small bowel obstruction cannot be excluded.  Original Report Authenticated By: Juline Patch, M.D.   Dg Abd 2 Views  04/13/2012  *RADIOLOGY REPORT*  Clinical Data: Abdominal pain, weakness.  ABDOMEN - 2 VIEW  Comparison: Abdominal radiograph 04/12/2012.  Findings: There is some gas, stool and residual oral contrast material noted within the colon.  There is a paucity of distal colonic and rectal gas. Visualized portions of the colon do not appear dilated.  Several dilated loops of small bowel are seen in the upper abdomen, measuring up to 4.2 cm in diameter.  No gross evidence of pneumoperitoneum is noted on this limited supine view of the abdomen.  Multiple surgical clips are seen throughout the pelvis.  Numerous vascular calcifications are noted.  IMPRESSION: 1.  The bowel gas pattern is again most consistent with a partial small-bowel obstruction, as above.  The appearance is very similar to the recent prior examination.  Original Report Authenticated By: Florencia Reasons, M.D.   Dg Abd 2 Views  04/08/2012  *RADIOLOGY REPORT*  Clinical Data: Bowel obstruction, abdominal pain  ABDOMEN - 2 VIEW  Comparison: 04/06/2012  Findings: Tip of nasogastric tube in distal esophagus, recommend advancing tube 10 cm. Persistent dilatation of small bowel loops up to 4.2 cm diameter. No bowel wall thickening or free peritoneal air. Lung bases appear clear. Gas and stool are present within the colon. Surgical clips in pelvis. Scattered  atherosclerotic calcifications. Bones appear demineralized with mild degenerative changes of the hip joints.  IMPRESSION: Persistent dilatation of small bowel loops though more gas and stool are present within the colon, consistent with partial small bowel obstruction. Recommend advancing nasogastric tube 10 cm.  Findings called to [facility] RN in Hemodialysis, where patient is currently located, on 04/08/2012 at 0805 hours.  Original Report Authenticated By: Lollie Marrow, M.D.   Dg Abd 2 Views  04/06/2012  *RADIOLOGY REPORT*  Clinical Data: Small bowel obstruction.  Abdominal pain.  ABDOMEN - 2 VIEW  Comparison: Radiographs dated 04/05/2012 and CT scan dated 04/03/2012  Findings: There is persistent dilatation of multiple small bowel loops.  The contrast given for the prior CT scan remains in these dilated small bowel loops.  No contrast in the nondistended colon.  No free air in the upright radiograph.  IMPRESSION: Persistent small bowel obstruction, unchanged.  Original Report Authenticated By: Gwynn Burly, M.D.   Dg Abd Portable 1v  04/16/2012  *RADIOLOGY REPORT*  Clinical Data: No bowel activity since exploratory laparoscopy 2 days ago question ileus, right lower quadrant pain  PORTABLE ABDOMEN - 1 VIEW  Comparison: 04/13/2012  Findings: Nasogastric tube in stomach. Retained contrast in colon. Surgical clips in pelvis bilaterally. Scattered atherosclerotic calcifications. No bowel dilatation, bowel wall thickening, or evidence of obstruction. Bowel staple lines noted in right mid abdomen.  IMPRESSION: Nonobstructive bowel gas pattern. Resolution of small bowel distention since prior exam.  Original Report Authenticated By: Lollie Marrow, M.D.    Medications: Scheduled Meds:    . antiseptic oral rinse  15 mL Mouth Rinse q12n4p  . chlorhexidine  15 mL Mouth Rinse BID  . darbepoetin (ARANESP) injection - DIALYSIS  100 mcg Intravenous Q Sat-HD  . feeding supplement (NEPRO CARB STEADY)  237 mL  Oral BID BM  . feeding supplement  1 Container Oral Q24H  . ferric gluconate (FERRLECIT/NULECIT) IV  125 mg Intravenous Q T,Th,Sa-HD  . lanthanum  1,000 mg Oral TID WC  . multivitamin  1 tablet Oral Daily  . pantoprazole  40 mg Oral BID AC  . predniSONE  5 mg Oral Q breakfast  . DISCONTD: feeding supplement  1 Container Oral Q24H  . DISCONTD: feeding supplement  1 Container Oral TID BM   Continuous Infusions:    . sodium chloride 20 mL/hr at 04/21/12 1905  . DISCONTD: fat emulsion    . DISCONTD: TPN (CLINIMIX) +/- additives       Assessment/Plan: Principal Problem:   *SBO (small bowel obstruction) s/p EL/LOA and SBR x 2 with postoperative ileus - Primary management per surgical team, on liquid diet, had watery BM today - Unfortunately no access placed secondary to bilateral upper venous occlusion. Discussed in detail with the patient, renal service and IR, reluctant to use the last remaining femoral access at this time. - Postop day #8, per surgery diet advance to full liquid today and nutritional supplements, recheck labs in a.m.  Leg graft occlusion  Acute occlusion of dialysis leg graft, patient s/p thrombectomy 7/25 L fem AV graft.   CKD (chronic kidney disease) stage V requiring chronic dialysis - Renal following, hemodialysis TTS   Thrombocytopenia: Improving   Gout: Stable   HTN (hypertension): Stable  Severe malnutrition: Continue liquid diet, PICC line to be placed for starting TNA   Acute respiratory failure with hypoxia currently stable:  - Patient had developed post operative respiratory failure 7-25 after AV thrombectomy, remained intubated and was exhibited on 04/16/2012. Acute respiratory failure after the surgery was probably related to COPD/Emphysema/ sedatives.    Sinus tachycardia with NSVT: Place on telemetry, monitor electrolytes, Mag, Phos, K  DVT Prophylaxis:  Code Status:Full   Disposition:  Given the multiple comorbid issues, likely to  require prolonged recovery period,  ESRD on HD, candidate for LTAC, if okay with surgery, hopefully DC in a.m..    LOS: 20 days   Ugochukwu Chichester M.D. [facility] 04/22/2012, 4:03 PM Pager: [phone]  If 7PM-7AM, please contact night-coverage [email or web address] Password [email or web address]

## 2012-04-22 NOTE — Transfer of Care (Signed)
Immediate Anesthesia Transfer of Care Note  Patient: [name]  Procedure(s) Performed: Procedure(s) (LRB): EXPLORATORY LAPAROTOMY (N/A)  Patient Location: ICU  Anesthesia Type: General  Level of Consciousness: sedated and unresponsive  Airway & Oxygen Therapy: Patient remains intubated per anesthesia plan and Patient placed on Ventilator (see vital sign flow sheet for setting)  Post-op Assessment: Report given to PACU RN and Post -op Vital signs reviewed and stable  Post vital signs: Reviewed and stable  Complications: No apparent anesthesia complications

## 2012-04-22 NOTE — Progress Notes (Signed)
Name: [name] MRN: [id number] DOB: 1960/11/25    LOS: 20  Referring Provider:  [facility] Reason for Referral:  Vent management  PULMONARY / CRITICAL CARE MEDICINE  Brief patient description:  51 y/o male with ESRD who was admitted on 7/13 with SBO, underwent lysis of adhesions and partial small bowel resection x2 on 7/24 and later developed a clot in his L femoral AV graft requiring thrombectomy on 7/25.  PCCM consulted for vent management.  Events Since Admission: 7/13  Admission 7/13  CT Abdomen with SBO 7/24  Ex-lap, lysis of adhesions, partial small bowel resection x2 7/25  Thrombectomy L femoral AV graft 7/26   Extubated, CVL is out 7/27  Transferred to telemetry under [facility] 8/1     Acute abdomen, dehiscence of anastomosis, SBO, back from OR intubated in ICU on PCCM service  Current Status:  Back from PR intubated, hemodynamically stable  Vital Signs: Temp:  [96.9 F (36.1 C)-99 F (37.2 C)] 97.6 F (36.4 C) (08/01 2356) Pulse Rate:  [101-122] 112  (08/01 2349) Resp:  [13-20] 14  (08/01 2349) BP: (114-152)/(65-87) 131/65 mmHg (08/01 2349) SpO2:  [96 %-100 %] 100 % (08/01 2349) FiO2 (%):  [50 %] 50 % (08/01 2349) Weight:  [47.7 kg (105 lb 2.6 oz)] 47.7 kg (105 lb 2.6 oz) (08/01 4098)  Physical Examination: Gen: Mechanically ventilated, synchronous HEENT: PERRL, NGT PULM: Bilateral air entry CV: RRR AB: surgical dressing intact, bowel sounds absent  Ext: No edema Derm: Skin intact Neuro: Sedated, nonfocal  Principal Problem:  *SBO (small bowel obstruction) s/p EL/LOA and SBR x 2 Active Problems:  CKD (chronic kidney disease) stage V requiring chronic dialysis  Erythrocytosis  Thrombocytopenia  Gout  HTN (hypertension)  Ileus following gastrointestinal surgery  Chronic use of steroids  Malnutrition, calorie  Leg graft occlusion  Acute respiratory failure with hypoxia  Sinus tachycardia  ASSESSMENT AND PLAN  PULMONARY  CXR:  8/1 >>>  ETT:   7/25 >>>  7/26 8/1 >>>  A:  Post operative respiratory failure. COPD? Emphysema on CXR. P:   Full mechanical support tonight CXR ABG SBT in AM Bronchodilators  CARDIOVASCULAR  Lines: NA  A: Hemodynamically stable.  No arrhythmia / ischemia. 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  L femoral vein thrombus, s/p thrombectomy 7/25. P:  If has to have central access would consult IR  RENAL  Lab 04/22/12 0636 04/21/12 0550 04/20/12 0655 04/18/12 0525 04/17/12 1930 04/16/12 0353  NA 140 141 136 140 139 --  K 5.3* 4.8 -- -- -- --  CL 96 97 96 99 97 --  CO2 23 25 25 30 26  --  BUN 69* 38* 60* 22 59* --  CREATININE 9.45* 6.70* 10.31* 4.62* 8.64* --  CALCIUM 8.6 8.6 8.6 8.8 9.0 --  MG 2.2 2.1 -- -- -- 1.9  PHOS 6.4* 5.0* 4.7* -- 7.0* 4.3   Intake/Output      08/01 0701 - 08/02 0700   P.O. 360   I.V. (mL/kg) 1700 (35.6)   IV Piggyback 500   Total Intake(mL/kg) 2560 (53.7)   Stool 1   Total Output 1   Net +2559        Foley:  Anuric  A:  ESRD, s/p thrombectomy 7/25 L fem AV graft.  Mild hyperkalemia.  Hyperphosphatemia. P:   Per Renal D/c Prednisone (was on as outpt likely for hx of renal transplant) as now on stress dose steroids  GASTROINTESTINAL  Lab 04/22/12  2952 04/21/12 0550 04/20/12 0655 04/17/12 1930 04/16/12 0353  AST 17 21 -- -- 26  ALT 10 11 -- -- 10  ALKPHOS 60 61 -- -- 39  BILITOT 1.0 1.2 -- -- 1.5*  PROT 5.7* 5.9* -- -- 5.6*  ALBUMIN 2.2* 2.4* 2.3* 2.6* 2.9*   A:  SBO, s/p lysis of adhesions and partial small bowel resection 7/24; post op ileus.  Anastomosis dehiscence / SBO s/p lysis of adhesions, small bowel resection, omental patch of anastomosis 8/1. P:   NGT to Sx NPO If TNA is required would consult IR for CVL placement Post op care per surgery  HEMATOLOGIC  Lab 04/22/12 0636 04/21/12 0550 04/20/12 0655 04/18/12 0525 04/17/12 1930  HGB 9.6* 10.2* 9.1* 9.6* 9.6*  HCT 29.1* 31.2* 27.7* 29.0* 29.1*  PLT 172 165 124* 118* 123*   INR -- -- -- -- --  APTT -- -- -- -- --   A:  No acute issues. P:  Trend CBC intermittently Aranesp Ferric gluconate  INFECTIOUS  Lab 04/22/12 0636 04/21/12 0550 04/20/12 0655 04/18/12 0525 04/17/12 1930  WBC 9.0 7.3 7.4 6.1 7.3  PROCALCITON -- -- -- -- --   Cultures:  NA  Antibiotics:  NA Zosyn 8/1 >>>  A:  Suspected peritonitis. P:  Empirical coverage as above  Trend CBC  ENDOCRINE  Lab 04/22/12 1720 04/22/12 1220 04/22/12 0406 04/22/12 0009 04/21/12 2021  GLUCAP 93 79 95 106* 100*   A:  Stress dose steroids.  Risk of hyperglycemia / hypoglycemia. P:   Start stress dose steroids SSI / CBG  NEUROLOGIC  A:  Post op pain/comfort P:   Intermittent Fentanyl / Versed Daily WUA  BEST PRACTICE / DISPOSITION Level of Care:  ICU Primary Service:  PCCM, to [facility] once out of ICU Consultants:  Vascular surgery, CCS, Renal Code Status:  Full Diet:  NPO DVT Px:  SCDs GI Px:  Protonix Skin Integrity:  No issues Social / Family:  Not available during rounds  The patient is critically ill with multiple organ systems failure and requires high complexity decision making for assessment and support, frequent evaluation and titration of therapies, application of advanced monitoring technologies and extensive interpretation of multiple databases. Critical Care Time devoted to patient care services described in this note is 35 minutes.  Orlean Bradford, M.D., F.C.C.P. Pulmonary and Critical Care Medicine [facility] Cell: [phone] Pager: [phone]  04/22/2012, 11:58 PM

## 2012-04-22 NOTE — Progress Notes (Addendum)
Nutrition Follow-up  Intervention:   1. Recommend nutrition support, consider enteral nutrition if warranted per surgery. Pt is malnourished and consuming insufficient amounts of calories and protein. Strongly recommend slow advancement of nutrition support. Monitor magnesium, potassium, and phosphorus daily for at least 3 days, MD to replete as needed, as pt is at risk for refeeding syndrome given dx of severe malnutrition. 2. Continue Breeze daily, each supplement provides 250 kcal and 9 grams of protein. 3. Nepro Shake po BID, each supplement provides 425 kcal and 19 grams protein. 4. Pt willing to drink small sips of fulls, please encourage intake of full liquids as often as possible throughout tday 5. RD to continue to follow nutrition care plan  Assessment:   Per most recent TPN pharmacist note, plan was to initiate Clinimix 5/15 at 40 ml/hr (with plans to titrate to goal of 80 ml/hr.) PICC placement attempted, however not placed 2/2 bilateral central occlusion. TPN was not initiated.  Intake of clear liquids remains variable. Pt upgraded to Full Liquids today to help increase calories. Pt in HD at this time.  Diet Order:  Full Liquid Supplement: Resource Breeze PO daily  Meds: Scheduled Meds:    . antiseptic oral rinse  15 mL Mouth Rinse q12n4p  . chlorhexidine  15 mL Mouth Rinse BID  . darbepoetin (ARANESP) injection - DIALYSIS  100 mcg Intravenous Q Sat-HD  . feeding supplement  1 Container Oral Q24H  . ferric gluconate (FERRLECIT/NULECIT) IV  125 mg Intravenous Q T,Th,Sa-HD  . lanthanum  1,000 mg Oral TID WC  . multivitamin  1 tablet Oral Daily  . pantoprazole  40 mg Oral BID AC  . predniSONE  5 mg Oral Q breakfast  . DISCONTD: feeding supplement  1 Container Oral BID BM   Continuous Infusions:    . sodium chloride 20 mL/hr at 04/21/12 1905  . TPN (CLINIMIX) +/- additives     And  . fat emulsion     PRN Meds:.acetaminophen, acetaminophen, albuterol, heparin,  hydrALAZINE, iohexol, morphine injection, ondansetron (ZOFRAN) IV, ondansetron, phenol, promethazine  Labs:  CMP     Component Value Date/Time   NA 140 04/22/2012 0636   K 5.3* 04/22/2012 0636   CL 96 04/22/2012 0636   CO2 23 04/22/2012 0636   GLUCOSE 95 04/22/2012 0636   BUN 69* 04/22/2012 0636   CREATININE 9.45* 04/22/2012 0636   CALCIUM 8.6 04/22/2012 0636   PROT 5.7* 04/22/2012 0636   ALBUMIN 2.2* 04/22/2012 0636   AST 17 04/22/2012 0636   ALT 10 04/22/2012 0636   ALKPHOS 60 04/22/2012 0636   BILITOT 1.0 04/22/2012 0636   GFRNONAA 6* 04/22/2012 0636   GFRAA 6* 04/22/2012 0636   Phosphorus  Date/Time Value Range Status  04/22/2012  6:36 AM 6.4* 2.3 - 4.6 mg/dL Final  1/61/0960  4:54 AM 5.0* 2.3 - 4.6 mg/dL Final  0/98/1191  4:78 AM 4.7* 2.3 - 4.6 mg/dL Final   Magnesium  Date/Time Value Range Status  04/22/2012  6:36 AM 2.2  1.5 - 2.5 mg/dL Final  2/95/6213  0:86 AM 2.1  1.5 - 2.5 mg/dL Final  5/78/4696  2:95 AM 1.9  1.5 - 2.5 mg/dL Final   Potassium  Date/Time Value Range Status  04/22/2012  6:36 AM 5.3* 3.5 - 5.1 mEq/L Final  04/21/2012  5:50 AM 4.8  3.5 - 5.1 mEq/L Final  04/20/2012  6:55 AM 4.4  3.5 - 5.1 mEq/L Final     Intake/Output Summary (Last 24 hours) at 04/22/12  5409 Last data filed at 04/22/12 0600  Gross per 24 hour  Intake    683 ml  Output      0 ml  Net    683 ml  BM 7/28  Ht: 5\' 11"  (1.803 m)  Weight Status:  47.8 kg s/p HD on 7/30 48.3 kg s/p HD on 7/27  Body mass index is 14.67 kg/(m^2). Underweight  Re-estimated needs:  1700 - 1950 kcal, 75 - 85 grams daily  Nutrition Dx:  Inadequate oral intake now R/T GI distress AEB pt report. Ongoing.  New Goal:  Initiation of TPN vs EN; intake to meet at least 90% of estimated needs. Unmet, TPN has yet to be started.  Monitor:  TPN vs EN initiation, weights, labs, diet advancement  Jarold Motto MS, RD, LDN Pager: [phone] After-hours pager: [phone]

## 2012-04-22 NOTE — Progress Notes (Signed)
[facility] Progress Note  Subjective:  PICC not placed due to bilateral central occlusion.  Objective Filed Vitals:   04/22/12 0700 04/22/12 0730 04/22/12 0800 04/22/12 0830  BP: 139/78 125/77 131/80 114/76  Pulse: 108 110 118 120  Temp:      TempSrc:      Resp: 13 14 13 14   Height:      Weight:      SpO2:  99% 100%    Physical Exam General: Emaciated, weak on HD Heart: tachy reg Lungs:no wheezes or rales Abdomen: mod distension Extremities: no LE edema Dialysis Access: elft thigh graft  Dialysis Orders: Center: [facility] on TTS.  EDW 52.5 kg HD Bath 2K/2Ca Time 3.5 hrs Heparin 5000 U. Access AVG @ left thigh BFR 400 DFR 800 Zemplar 0 mcg IV/HD Epogen 0 Units IV/HD Venofer 50/week; last iPTH 314, but product high and no zemplar   Assessment/Plan:  1. SBO- s/p exp lap with small bowel resection x 2; POD # 8 ; x-ray 7/30 suggestive of ileus, but partial SBO cannot be excluded; intake limited; surgery following conservative approach.  2. Fever - T max100. - slight increase in WBC 3. ESRD - HD TTS at [facility]; ; significant weight loss (5 kg) since adm and will need new eDW at time of discharge; ok to start tight heparin 4. Hypertension/volume - labile BP's; no BP meds at home; BP up slightly and tachy - usual HR is in 60s at outpt dialysis; need to be sure he doesn't get too dry  - HR up to 120s on HD tx bolus saline and keep even for the rest of HD.  Pre HD wt 47.7 5. Anemia - Hgb 9.6 Aranesp 100 started 7/28; Fe < 10; on  full course of IV Fe; ferritin 552; 6. Metabolic bone disease - Fosrenol was resumed at 1gm po TIDWC - but no P in CL, so nursing is holding for now; Protein calorie malnutrition-Alb 2.4; diet advnaced to full  liquids w ; encourage to drink small freq sips liquid;  Unable to placel PICC line for TNA due to occluded central veins in chest. 7. S/p renal transplant - at [facility] in '87, started HD in 04/2003, remains on Prednisone.       8. Hepatitis  C/thrombocytopenia- Plts up to 165K;        9. Disposition- per primary; deconditioned and currently followed by PT/OT - very slow progress  Sheffield Slider, PA-C [facility] Beeper [phone]  04/22/2012,9:10 AM  LOS: 20 days   Patient seen and examined and agree with assessment and plan as above.  Vinson Moselle  MD [facility] [phone] pgr    [phone] cell 04/22/2012, 1:03 PM     Additional Objective Labs: Basic Metabolic Panel:  Lab 04/22/12 2952 04/21/12 0550 04/20/12 0655  NA 140 141 136  K 5.3* 4.8 4.4  CL 96 97 96  CO2 23 25 25   GLUCOSE 95 80 76  BUN 69* 38* 60*  CREATININE 9.45* 6.70* 10.31*  CALCIUM 8.6 8.6 8.6  ALB -- -- --  PHOS 6.4* 5.0* 4.7*   Liver Function Tests:  Lab 04/22/12 0636 04/21/12 0550 04/20/12 0655 04/16/12 0353  AST 17 21 -- 26  ALT 10 11 -- 10  ALKPHOS 60 61 -- 39  BILITOT 1.0 1.2 -- 1.5*  PROT 5.7* 5.9* -- 5.6*  ALBUMIN 2.2* 2.4* 2.3* --  CBC:  Lab 04/22/12 0636 04/21/12 0550 04/20/12 8413 04/18/12 0525 04/17/12 1930  04/16/12 0353  WBC 9.0 7.3 7.4 -- -- --  NEUTROABS -- 5.4 -- -- -- 7.3  HGB 9.6* 10.2* 9.1* -- -- --  HCT 29.1* 31.2* 27.7* -- -- --  MCV 85.8 87.2 85.8 86.6 84.8 --  PLT 172 165 124* -- -- --   Blood Culture No results found for this basename: sdes, specrequest, cult, reptstatus    Cardiac Enzymes: No results found for this basename: CKTOTAL:5,CKMB:5,CKMBINDEX:5,TROPONINI:5 in the last 168 hours CBG:  Lab 04/22/12 0406 04/22/12 0009 04/21/12 2021 04/21/12 1649 04/21/12 1151  GLUCAP 95 106* 100* 94 77   Iron Studies:  Basename 01-May-2012 0655  IRON <10*  TIBC Not calculated due to Iron <10.  TRANSFERRIN --  FERRITIN 552*   Studies/Results: Dg Abd 2 Views  2012-05-01  *RADIOLOGY REPORT*  Clinical Data: Postop abdominal distention, recent of abdominal surgery for small bowel obstruction and lysis of the patient with partial small bowel resection  ABDOMEN - 2 VIEW  Comparison:  Portable abdomen film of 04/16/2012  Findings: Both large and small bowel gas is present which may indicate postoperative ileus.  However there does appear be more distention of small bowel loops and a partial small bowel obstruction cannot be excluded.  Some contrast is scattered throughout the bowel.  Arterial calcifications are noted.  IMPRESSION: Distention of both large and small bowel may indicate ileus, but a partial small bowel obstruction cannot be excluded.  Original Report Authenticated By: Juline Patch, M.D.   Medications:    . sodium chloride 20 mL/hr at 04/21/12 1905  . TPN (CLINIMIX) +/- additives     And  . fat emulsion        . antiseptic oral rinse  15 mL Mouth Rinse q12n4p  . chlorhexidine  15 mL Mouth Rinse BID  . darbepoetin (ARANESP) injection - DIALYSIS  100 mcg Intravenous Q Sat-HD  . feeding supplement  1 Container Oral Q24H  . ferric gluconate (FERRLECIT/NULECIT) IV  125 mg Intravenous Q T,Th,Sa-HD  . lanthanum  1,000 mg Oral TID WC  . multivitamin  1 tablet Oral Daily  . pantoprazole  40 mg Oral BID AC  . predniSONE  5 mg Oral Q breakfast  . sodium chloride  250 mL Intravenous Once  . DISCONTD: feeding supplement  1 Container Oral BID BM  . DISCONTD: ferric gluconate (FERRLECIT/NULECIT) IV  62.5 mg Intravenous Q Tue-HD

## 2012-04-22 NOTE — Brief Op Note (Addendum)
04/02/2012 - 04/22/2012  11:12 PM  PATIENT:  [name]  51 y.o. male  PRE-OPERATIVE DIAGNOSIS:  acute abdomen, small bowel fistula/perforation  POST-OPERATIVE DIAGNOSIS:    acute abdomen Dehiscence of SB anastomosis with distal SBO  PROCEDURE:  Procedure(s) (LRB): EXPLORATORY LAPAROTOMY (N/A) Lysis of adhesions SB resection Omental patch of anastomosis  SURGEON:  Surgeon(s) and Role:    * Ardeth Sportsman, MD - Primary  ASSISTANTS: Robyne Askew, MD - Assisting   ANESTHESIA:   general  EBL:  Total I/O In: 1500 [I.V.:1000; IV Piggyback:500] Out: -   BLOOD ADMINISTERED:none  DRAINS: none   LOCAL MEDICATIONS USED:  NONE  SPECIMEN:  Source of Specimen:  jejunum with dehisced anastomosis  DISPOSITION OF SPECIMEN:  PATHOLOGY  COUNTS:  YES  TOURNIQUET:  * No tourniquets in log *  DICTATION: .Other Dictation: Dictation Number [id number]  PLAN OF CARE: Admit to inpatient   PATIENT DISPOSITION:  ICU - intubated and hemodynamically stable.   Delay start of Pharmacological VTE agent (>24hrs) due to surgical blood loss or risk of bleeding: no

## 2012-04-22 NOTE — OR Nursing (Signed)
1st call- 2100 notified surgery complete @ 2315.  2nd call- 2100 notified patient leaving the OR @ 2334.

## 2012-04-23 ENCOUNTER — Inpatient Hospital Stay (HOSPITAL_COMMUNITY): Payer: Medicare Other

## 2012-04-23 ENCOUNTER — Encounter (HOSPITAL_COMMUNITY): Payer: Self-pay | Admitting: Surgery

## 2012-04-23 DIAGNOSIS — K929 Disease of digestive system, unspecified: Secondary | ICD-10-CM

## 2012-04-23 LAB — PREPARE RBC (CROSSMATCH)

## 2012-04-23 LAB — GLUCOSE, CAPILLARY
Glucose-Capillary: 111 mg/dL — ABNORMAL HIGH (ref 70–99)
Glucose-Capillary: 115 mg/dL — ABNORMAL HIGH (ref 70–99)
Glucose-Capillary: 118 mg/dL — ABNORMAL HIGH (ref 70–99)
Glucose-Capillary: 159 mg/dL — ABNORMAL HIGH (ref 70–99)

## 2012-04-23 LAB — BASIC METABOLIC PANEL
BUN: 33 mg/dL — ABNORMAL HIGH (ref 6–23)
Chloride: 105 mEq/L (ref 96–112)
Creatinine, Ser: 4.84 mg/dL — ABNORMAL HIGH (ref 0.50–1.35)
GFR calc Af Amer: 15 mL/min — ABNORMAL LOW (ref >90)
GFR calc non Af Amer: 13 mL/min — ABNORMAL LOW (ref >90)
Potassium: 4.3 mEq/L (ref 3.5–5.1)

## 2012-04-23 LAB — BLOOD GAS, ARTERIAL
Drawn by: 28340
MECHVT: 600 mL
TCO2: 20.4 mmol/L (ref 0–100)
pCO2 arterial: 30.1 mmHg — ABNORMAL LOW (ref 35.0–45.0)
pH, Arterial: 7.428 (ref 7.350–7.450)

## 2012-04-23 LAB — MAGNESIUM: Magnesium: 1.9 mg/dL (ref 1.5–2.5)

## 2012-04-23 MED ORDER — FAT EMULSION 20 % IV EMUL
250.0000 mL | INTRAVENOUS | Status: DC
  Filled 2012-04-23: qty 250

## 2012-04-23 MED ORDER — HYDROCORTISONE SOD SUCCINATE 100 MG IJ SOLR
50.0000 mg | Freq: Two times a day (BID) | INTRAMUSCULAR | Status: DC
  Administered 2012-04-23 – 2012-04-25 (×5): 50 mg via INTRAVENOUS
  Filled 2012-04-23 (×6): qty 1

## 2012-04-23 MED ORDER — HYDROMORPHONE HCL PF 1 MG/ML IJ SOLN: 0.2500 mg | INTRAMUSCULAR | Status: DC | PRN

## 2012-04-23 MED ORDER — ONDANSETRON HCL 4 MG/2ML IJ SOLN: 4.0000 mg | Freq: Once | INTRAMUSCULAR | Status: DC | PRN

## 2012-04-23 MED ORDER — SODIUM CHLORIDE 0.9 % IJ SOLN
10.0000 mL | INTRAMUSCULAR | Status: DC | PRN
  Administered 2012-04-23 – 2012-06-05 (×14): 10 mL

## 2012-04-23 MED ORDER — BISACODYL 10 MG RE SUPP: 10.0000 mg | Freq: Every day | RECTAL | Status: DC

## 2012-04-23 MED ORDER — FAT EMULSION 20 % IV EMUL
250.0000 mL | INTRAVENOUS | Status: AC
  Administered 2012-04-23: 250 mL via INTRAVENOUS
  Filled 2012-04-23: qty 250

## 2012-04-23 MED ORDER — FLUCONAZOLE IN SODIUM CHLORIDE 400-0.9 MG/200ML-% IV SOLN
400.0000 mg | Freq: Once | INTRAVENOUS | Status: AC
  Administered 2012-04-23: 400 mg via INTRAVENOUS
  Filled 2012-04-23: qty 200

## 2012-04-23 MED ORDER — SODIUM CHLORIDE 0.9 % IJ SOLN
10.0000 mL | Freq: Two times a day (BID) | INTRAMUSCULAR | Status: DC
  Administered 2012-04-24 – 2012-05-10 (×26): 10 mL
  Administered 2012-05-11: 20 mL
  Administered 2012-05-12 – 2012-06-02 (×25): 10 mL

## 2012-04-23 MED ORDER — SODIUM CHLORIDE 0.9 % IV BOLUS (SEPSIS): 500.0000 mL | Freq: Once | INTRAVENOUS | Status: DC

## 2012-04-23 MED ORDER — INSULIN ASPART 100 UNIT/ML ~~LOC~~ SOLN
0.0000 [IU] | SUBCUTANEOUS | Status: DC
  Administered 2012-04-23: 3 [IU] via SUBCUTANEOUS
  Administered 2012-04-24 (×3): 2 [IU] via SUBCUTANEOUS
  Administered 2012-04-24: 3 [IU] via SUBCUTANEOUS
  Administered 2012-04-25 – 2012-04-26 (×7): 2 [IU] via SUBCUTANEOUS
  Administered 2012-04-26 – 2012-04-27 (×7): 3 [IU] via SUBCUTANEOUS
  Administered 2012-04-27 – 2012-04-28 (×2): 2 [IU] via SUBCUTANEOUS
  Administered 2012-04-28: 3 [IU] via SUBCUTANEOUS
  Administered 2012-04-28: 2 [IU] via SUBCUTANEOUS
  Administered 2012-04-28 (×2): 3 [IU] via SUBCUTANEOUS
  Administered 2012-04-28 – 2012-04-29 (×3): 2 [IU] via SUBCUTANEOUS
  Administered 2012-04-29: 3 [IU] via SUBCUTANEOUS
  Administered 2012-04-29 (×2): 2 [IU] via SUBCUTANEOUS
  Administered 2012-04-30: 3 [IU] via SUBCUTANEOUS
  Administered 2012-04-30: 2 [IU] via SUBCUTANEOUS
  Administered 2012-04-30: 3 [IU] via SUBCUTANEOUS
  Administered 2012-04-30: 2 [IU] via SUBCUTANEOUS
  Administered 2012-05-01 – 2012-05-03 (×13): 3 [IU] via SUBCUTANEOUS
  Administered 2012-05-04: 2 [IU] via SUBCUTANEOUS
  Administered 2012-05-04: 3 [IU] via SUBCUTANEOUS
  Administered 2012-05-04: 2 [IU] via SUBCUTANEOUS
  Administered 2012-05-04: 3 [IU] via SUBCUTANEOUS
  Administered 2012-05-05 (×4): 2 [IU] via SUBCUTANEOUS
  Administered 2012-05-05: 3 [IU] via SUBCUTANEOUS
  Administered 2012-05-05 – 2012-05-06 (×2): 2 [IU] via SUBCUTANEOUS
  Administered 2012-05-06: 3 [IU] via SUBCUTANEOUS
  Administered 2012-05-06 – 2012-05-08 (×6): 2 [IU] via SUBCUTANEOUS
  Administered 2012-05-09: 3 [IU] via SUBCUTANEOUS
  Administered 2012-05-09 – 2012-05-10 (×2): 2 [IU] via SUBCUTANEOUS
  Administered 2012-05-10: 3 [IU] via SUBCUTANEOUS
  Administered 2012-05-10 – 2012-05-13 (×6): 2 [IU] via SUBCUTANEOUS
  Administered 2012-05-13: 3 [IU] via SUBCUTANEOUS
  Administered 2012-05-14 – 2012-05-17 (×6): 2 [IU] via SUBCUTANEOUS

## 2012-04-23 MED ORDER — HYDROCORTISONE SOD SUCCINATE 100 MG IJ SOLR
50.0000 mg | Freq: Four times a day (QID) | INTRAMUSCULAR | Status: DC
  Administered 2012-04-23 (×2): 50 mg via INTRAVENOUS
  Filled 2012-04-23 (×6): qty 1

## 2012-04-23 MED ORDER — ZINC TRACE METAL 1 MG/ML IV SOLN
INTRAVENOUS | Status: DC
  Filled 2012-04-23: qty 2000

## 2012-04-23 MED ORDER — HEPARIN SODIUM (PORCINE) 1000 UNIT/ML IJ SOLN
INTRAMUSCULAR | Status: AC
  Administered 2012-04-23: 1000 [IU] via INTRAVENOUS_CENTRAL
  Filled 2012-04-23: qty 3

## 2012-04-23 MED ORDER — ZINC TRACE METAL 1 MG/ML IV SOLN
INTRAVENOUS | Status: AC
  Administered 2012-04-23: 17:00:00 via INTRAVENOUS
  Filled 2012-04-23: qty 1000

## 2012-04-23 MED ORDER — PANTOPRAZOLE SODIUM 40 MG IV SOLR
40.0000 mg | INTRAVENOUS | Status: DC
  Administered 2012-04-23 – 2012-05-14 (×21): 40 mg via INTRAVENOUS
  Filled 2012-04-23 (×23): qty 40

## 2012-04-23 MED ORDER — BLISTEX EX OINT
TOPICAL_OINTMENT | Freq: Two times a day (BID) | CUTANEOUS | Status: DC
  Administered 2012-04-23 – 2012-04-24 (×3): via TOPICAL
  Administered 2012-04-25: 1 via TOPICAL
  Administered 2012-04-25 – 2012-05-12 (×19): via TOPICAL
  Administered 2012-05-14: 1 via TOPICAL
  Administered 2012-05-16 – 2012-06-04 (×33): via TOPICAL
  Filled 2012-04-23 (×3): qty 10

## 2012-04-23 MED ORDER — FENTANYL CITRATE 0.05 MG/ML IJ SOLN
100.0000 ug | INTRAMUSCULAR | Status: DC | PRN
  Administered 2012-04-23: 100 ug via INTRAVENOUS
  Filled 2012-04-23 (×2): qty 2

## 2012-04-23 MED ORDER — HYDROMORPHONE HCL PF 1 MG/ML IJ SOLN
INTRAMUSCULAR | Status: AC
  Filled 2012-04-23: qty 1

## 2012-04-23 MED ORDER — HYDROMORPHONE HCL PF 1 MG/ML IJ SOLN
1.0000 mg | Freq: Once | INTRAMUSCULAR | Status: AC
  Administered 2012-04-23: 1 mg via INTRAVENOUS

## 2012-04-23 MED ORDER — LIP MEDEX EX OINT
1.0000 "application " | TOPICAL_OINTMENT | Freq: Two times a day (BID) | CUTANEOUS | Status: DC
  Filled 2012-04-23 (×2): qty 7

## 2012-04-23 MED ORDER — MAGIC MOUTHWASH
15.0000 mL | Freq: Four times a day (QID) | ORAL | Status: DC | PRN
  Filled 2012-04-23: qty 15

## 2012-04-23 MED ORDER — MIDAZOLAM HCL 2 MG/2ML IJ SOLN
2.0000 mg | INTRAMUSCULAR | Status: DC | PRN
  Filled 2012-04-23: qty 2

## 2012-04-23 MED ORDER — FENTANYL CITRATE 0.05 MG/ML IJ SOLN
50.0000 ug | INTRAMUSCULAR | Status: DC | PRN
  Administered 2012-04-23 (×2): 100 ug via INTRAVENOUS
  Administered 2012-04-23 (×2): 50 ug via INTRAVENOUS
  Administered 2012-04-23 (×2): 100 ug via INTRAVENOUS
  Filled 2012-04-23 (×5): qty 2

## 2012-04-23 NOTE — Progress Notes (Signed)
Pt weaning 5/5 at 30% per Dr. Alva. 

## 2012-04-23 NOTE — Progress Notes (Signed)
Name: [name] MRN: [id number] DOB: 06-Apr-1961    LOS: 21  Referring Provider:  [facility] Reason for Referral:  Vent management  PULMONARY / CRITICAL CARE MEDICINE  Brief patient description:  51 y/o male with ESRD who was admitted on 7/13 with SBO, underwent lysis of adhesions and partial small bowel resection x2 on 7/24 and later developed a clot in his L femoral AV graft requiring thrombectomy on 7/25.  PCCM consulted for vent management.  Events Since Admission: 7/13  CT Abdomen with SBO 7/24  Ex-lap, lysis of adhesions, partial small bowel resection x2 7/25  Thrombectomy L femoral AV graft 7/26   Extubated, CVL is out 7/27  Transferred to telemetry under TRH 8/1     Acute abdomen, dehiscence of anastomosis, SBO, back from OR intubated in ICU on PCCM service Op findings - small bowel anastomosis had completely opened up to 3-cm opening with frank succus in the abdomen.  He seemed to have a kinked up loop of small bowel in the pelvis suspicious for distal small bowel obstruction or small bowel obstruction distal to this anastomosis. He had old hematoma in the area. He had moderateperitonitisinfraumbilically less so in bilateral upper quadrants.   Current Status:  hemodynamically stable Denies CP, dyspnea  Vital Signs: Temp:  [97.3 F (36.3 C)-99.4 F (37.4 C)] 99.4 F (37.4 C) (08/02 0741) Pulse Rate:  [101-131] 113  (08/02 1200) Resp:  [14-23] 22  (08/02 1200) BP: (91-136)/(49-83) 119/66 mmHg (08/02 1200) SpO2:  [96 %-100 %] 100 % (08/02 1200) FiO2 (%):  [30 %-50 %] 30 % (08/02 0800) Weight:  [50.8 kg (111 lb 15.9 oz)] 50.8 kg (111 lb 15.9 oz) (08/02 0500)  Physical Examination: Gen: Mechanically ventilated, synchronous HEENT: PERRL, NGT PULM: Bilateral air entry CV: RRR AB: surgical dressing intact, bowel sounds absent  Ext: No edema Derm: Skin intact Neuro: Sedated, nonfocal  Principal Problem:  *SBO (small bowel obstruction) s/p EL/LOA and SBR x 2 Active  Problems:  CKD (chronic kidney disease) stage V requiring chronic dialysis  Erythrocytosis  Thrombocytopenia  Gout  HTN (hypertension)  Ileus following gastrointestinal surgery  Chronic use of steroids  Malnutrition, calorie  Leg graft occlusion  Acute respiratory failure with hypoxia  Sinus tachycardia  ASSESSMENT AND PLAN  PULMONARY  CXR:  8/1 >>>  ETT:   7/25 >>> 7/26 8/1 >>>  A:  Post operative respiratory failure. COPD? Emphysema on CXR. P:   Extuabte  CARDIOVASCULAR  Lines: NA  A: Hemodynamically stable.  No arrhythmia / ischemia. 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  L femoral vein thrombus, s/p thrombectomy 7/25. P:   consult IR for  central access  RENAL  Lab 04/23/12 0430 04/22/12 0636 04/21/12 0550 04/20/12 0655 04/18/12 0525 04/17/12 1930  NA 147* 140 141 136 140 --  K 4.3 5.3* -- -- -- --  CL 105 96 97 96 99 --  CO2 21 23 25 25 30  --  BUN 33* 69* 38* 60* 22 --  CREATININE 4.84* 9.45* 6.70* 10.31* 4.62* --  CALCIUM 8.4 8.6 8.6 8.6 8.8 --  MG 1.9 2.2 2.1 -- -- --  PHOS 4.3 6.4* 5.0* 4.7* -- 7.0*   Intake/Output      08/01 0701 - 08/02 0700 08/02 0701 - 08/03 0700   P.O. 360    I.V. (mL/kg) 1820 (35.8) 135 (2.7)   NG/GT  60   IV Piggyback 600 500   Total Intake(mL/kg) 2780 (54.7) 695 (13.7)  Emesis/NG output 300 200   Stool 1    Total Output 301 200   Net +2479 +495         Foley:  Anuric  A:  ESRD, s/p thrombectomy 7/25 L fem AV graft.  Mild hyperkalemia.  Hyperphosphatemia. P:   Per Renal D/c Prednisone (was on as outpt likely for hx of renal transplant) , ct stress dose steroids 50 q 12h - if concern for wound healing, consider stopping  GASTROINTESTINAL  Lab 04/22/12 0636 04/21/12 0550 04/20/12 0655 04/17/12 1930  AST 17 21 -- --  ALT 10 11 -- --  ALKPHOS 60 61 -- --  BILITOT 1.0 1.2 -- --  PROT 5.7* 5.9* -- --  ALBUMIN 2.2* 2.4* 2.3* 2.6*   A:  SBO, s/p lysis of adhesions and partial small  bowel resection 7/24; post op ileus.  Anastomosis dehiscence / SBO s/p lysis of adhesions, small bowel resection, omental patch of anastomosis 8/1. P:   NGT to Sx NPO IR for CVL placement for TNA Post op care per surgery  HEMATOLOGIC  Lab 04/22/12 0636 04/21/12 0550 04/20/12 0655 04/18/12 0525 04/17/12 1930  HGB 9.6* 10.2* 9.1* 9.6* 9.6*  HCT 29.1* 31.2* 27.7* 29.0* 29.1*  PLT 172 165 124* 118* 123*  INR -- -- -- -- --  APTT -- -- -- -- --   A:  No acute issues. P:  Trend CBC intermittently Aranesp Ferric gluconate  INFECTIOUS  Lab 04/22/12 0636 04/21/12 0550 04/20/12 0655 04/18/12 0525 04/17/12 1930  WBC 9.0 7.3 7.4 6.1 7.3  PROCALCITON -- -- -- -- --   Cultures:  NA  Antibiotics:  NA Zosyn 8/1 >>>  A:  Suspected peritonitis. P:  Empiric coverage -add diflucan Trend CBC  ENDOCRINE  Lab 04/23/12 1223 04/23/12 0345 04/22/12 2342 04/22/12 1720 04/22/12 1220  GLUCAP 115* 111* 118* 93 79   A:  Stress dose steroids.  Risk of hyperglycemia / hypoglycemia. P:   stress dose steroids - if concern for wound healing, consider stopping SSI / CBG  NEUROLOGIC  A:  Post op pain/comfort P:   Intermittent Fentanyl   BEST PRACTICE / DISPOSITION Level of Care:  ICU Primary Service:  PCCM, to [facility] once out of ICU -hope in 24h Consultants:  Vascular surgery, CCS, Renal Code Status:  Full Diet:  NPO DVT Px:  SCDs GI Px:  Protonix Skin Integrity:  No issues Social / Family:  Not available during rounds  The patient is critically ill with multiple organ systems failure and requires high complexity decision making for assessment and support, frequent evaluation and titration of therapies, application of advanced monitoring technologies and extensive interpretation of multiple databases. Critical Care Time devoted to patient care services described in this note is 35 minutes.  Egan Berkheimer V.   04/23/2012, 12:46 PM

## 2012-04-23 NOTE — Progress Notes (Signed)
Nutrition Follow-up  Intervention:   1. TPN per pharmacy. Strongly recommend slow advancement of nutrition support. Monitor magnesium, potassium, and phosphorus daily for at least 3 days, MD to replete as needed, as pt is at risk for refeeding syndrome given dx of severe malnutrition. 2. RD to continue to follow nutrition care plan  Assessment:   Yesterday pt found to have green and brown thick contents draining from incision, suspected peritonitis. Pt was in pain and tachycardic. Pt underwent exp lap, lysis of adhesions, SB resection, and dehiscence of anastamosis. Pt was intubated for procedure, left on vent overnight, and currently extubated at this time.  TPN pharmacist consulted today for initiation of TPN. Plan for Clinimix 5/15 at 40 ml/hr and to watch closely for refeeding syndrome. RN reports that pt was able to get access for TPN this morning.  Patient is currently ordered for TPN with Clinimix 5/15 @ 40 ml/hr.  Lipids (20% IVFE @ 5 ml/hr), multivitamins, and trace elements are provided 3 times weekly (MWF) due to national backorder.  Provides 785 kcal and 48 grams protein daily (based on weekly average).  Meets 46% minimum estimated kcal and 64% minimum estimated protein needs.  Diet Order:  NPO Supplement: all supplements discontinued  Meds: Scheduled Meds:    . antiseptic oral rinse  15 mL Mouth Rinse q12n4p  . chlorhexidine  15 mL Mouth Rinse BID  . darbepoetin (ARANESP) injection - DIALYSIS  100 mcg Intravenous Q Sat-HD  . ferric gluconate (FERRLECIT/NULECIT) IV  125 mg Intravenous Q T,Th,Sa-HD  . heparin      . hydrocortisone sodium succinate  50 mg Intravenous Q6H  . HYDROmorphone      .  HYDROmorphone (DILAUDID) injection  1 mg Intravenous Once  . insulin aspart  0-15 Units Subcutaneous Q4H  . lip balm   Topical BID  . pantoprazole (PROTONIX) IV  40 mg Intravenous Q24H  . piperacillin-tazobactam (ZOSYN)  IV  2.25 g Intravenous Q8H  . piperacillin-tazobactam (ZOSYN)   IV  3.375 g Intravenous Once  . DISCONTD: bisacodyl  10 mg Rectal Daily  . DISCONTD: feeding supplement (NEPRO CARB STEADY)  237 mL Oral BID BM  . DISCONTD: feeding supplement  1 Container Oral Q24H  . DISCONTD: irrigation builder   Irrigation Once  . DISCONTD: lanthanum  1,000 mg Oral TID WC  . DISCONTD: lip balm  1 application Topical BID  . DISCONTD: multivitamin  1 tablet Oral Daily  . DISCONTD: pantoprazole  40 mg Oral BID AC  . DISCONTD: piperacillin-tazobactam  3.375 g Intravenous Q8H  . DISCONTD: predniSONE  5 mg Oral Q breakfast   Continuous Infusions:    . sodium chloride 50 mL/hr at 04/23/12 0927  . TPN (CLINIMIX) +/- additives     And  . fat emulsion    . DISCONTD: fat emulsion    . DISCONTD: TPN (CLINIMIX) +/- additives     PRN Meds:.acetaminophen, acetaminophen, albuterol, fentaNYL, heparin, hydrALAZINE, ondansetron (ZOFRAN) IV, ondansetron, promethazine, DISCONTD: 0.9 % irrigation (POUR BTL), DISCONTD: clindamycin, DISCONTD: fentaNYL, DISCONTD: gentamicin, DISCONTD:  HYDROmorphone (DILAUDID) injection, DISCONTD: magic mouthwash, DISCONTD: midazolam, DISCONTD:  morphine injection, DISCONTD: ondansetron (ZOFRAN) IV, DISCONTD: phenol  Labs:  CMP     Component Value Date/Time   NA 147* 04/23/2012 0430   K 4.3 04/23/2012 0430   CL 105 04/23/2012 0430   CO2 21 04/23/2012 0430   GLUCOSE 108* 04/23/2012 0430   BUN 33* 04/23/2012 0430   CREATININE 4.84* 04/23/2012 0430   CALCIUM 8.4 04/23/2012  0430   PROT 5.7* 04/22/2012 0636   ALBUMIN 2.2* 04/22/2012 0636   AST 17 04/22/2012 0636   ALT 10 04/22/2012 0636   ALKPHOS 60 04/22/2012 0636   BILITOT 1.0 04/22/2012 0636   GFRNONAA 13* 04/23/2012 0430   GFRAA 15* 04/23/2012 0430   Phosphorus  Date/Time Value Range Status  04/23/2012  4:30 AM 4.3  2.3 - 4.6 mg/dL Final  02/22/1307  6:57 AM 6.4* 2.3 - 4.6 mg/dL Final  8/46/9629  5:28 AM 5.0* 2.3 - 4.6 mg/dL Final   Magnesium  Date/Time Value Range Status  04/23/2012  4:30 AM 1.9  1.5 - 2.5 mg/dL Final    12/22/3242  0:10 AM 2.2  1.5 - 2.5 mg/dL Final  2/72/5366  4:40 AM 2.1  1.5 - 2.5 mg/dL Final   Potassium  Date/Time Value Range Status  04/23/2012  4:30 AM 4.3  3.5 - 5.1 mEq/L Final  04/22/2012  6:36 AM 5.3* 3.5 - 5.1 mEq/L Final  04/21/2012  5:50 AM 4.8  3.5 - 5.1 mEq/L Final     Intake/Output Summary (Last 24 hours) at 04/23/12 1203 Last data filed at 04/23/12 1000  Gross per 24 hour  Intake   2750 ml  Output    400 ml  Net   2350 ml  BM 8/1  Ht: 5\' 11"  (1.803 m)  Weight Status:  47.8 kg s/p HD on 7/30 48.3 kg s/p HD on 7/27  Body mass index is 15.62 kg/(m^2). Underweight  Re-estimated needs:  1700 - 1950 kcal, 75 - 85 grams daily  Nutrition Dx:  Inadequate oral intake now R/T GI distress AEB pt report. Ongoing.  New Goal:  Initiation of TPN; intake to meet at least 90% of estimated needs. Unmet, TPN has yet to be started.  Monitor:  TPN use, weights, labs, diet advancement  Jarold Motto MS, RD, LDN Pager: [phone] After-hours pager: [phone]

## 2012-04-23 NOTE — Anesthesia Postprocedure Evaluation (Signed)
  Anesthesia Post-op Note  Patient: [name]  Procedure(s) Performed: Procedure(s) (LRB): EXPLORATORY LAPAROTOMY (N/A)  Patient Location: SICU  Anesthesia Type: General  Level of Consciousness: sedated and Patient remains intubated per anesthesia plan  Airway and Oxygen Therapy: Patient remains intubated per anesthesia plan and Patient placed on Ventilator (see vital sign flow sheet for setting)  Post-op Pain: none  Post-op Assessment: Post-op Vital signs reviewed  Post-op Vital Signs: Reviewed  Complications: No apparent anesthesia complications

## 2012-04-23 NOTE — Op Note (Signed)
NAME:  [name], [name] NO.:  [id number]  MEDICAL RECORD NO.:  [id number]  LOCATION:  [facility]                         FACILITY:  [facility]  PHYSICIAN:  Ardeth Sportsman, MD     DATE OF BIRTH:  1960-11-17  DATE OF PROCEDURE: DATE OF DISCHARGE:                              OPERATIVE REPORT   SURGEON:  Ardeth Sportsman, MD, FACS.  ASSISTANT:  Chevis Pretty, MD, FACS.  PREOPERATIVE DIAGNOSES: 1. Acute abdomen. 2. Enteric contents leaking through incision, probable anastomotic     leak or fistula.  POSTOPERATIVE DIAGNOSES: 1. Lower abdominal peritonitis with old hematoma. 2. Dehiscence of small bowel anastomosis.  PROCEDURE PERFORMED: 1. Exploratory laparotomy. 2. Lysis of adhesions. 3. Small bowel resection with side-to-side stapled anastomosis. 4. Omental patch of small bowel anastomosis.  ANESTHESIA:  General anesthesia.  SPECIMENS:  Jejunum.  DRAINS:  None.  ESTIMATED BLOOD LOSS:  200 mL.  COMPLICATIONS:  Small bowel enterotomy resected with initial specimen.  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  Came in with a small bowel obstruction.  He was monitored, but failed advanced diet and required urgent exploration.  He had 2 small bowel resections.  He had a prolonged ileus but began to open up and have flatus and bowel movements and nasogastric tube was removed; however, he noted drainage from his incision earlier today.  It became frankly brown and green.  Daytime nurse noticed this at the end of her shift and called.  He had evidence of peritoneal signs in bilateral lower quadrants and had frank enteric/feculent drainage in multiple sites.  Probable anastomotic leak was noted.  I thought it was not safe to just Pouch & observe especially with tachycardia and peritonitis.  I recommended abdominal exploration.  Risks of  stroke, heart attack, death, bleeding, dehiscence, possible ostomy, and death were discussed.  I do not think he had any other alternatives just to manage this locally but I think with his peritonitis, his risk of death is very high without surgery. He understands and agreed to proceed.  OPERATIVE FINDINGS:  His prior small bowel anastomosis had completely opened up to 3-cm opening with frank succus in the abdomen. He seemed to have a kinked up loop of small bowel in the pelvis suspicious for distal small bowel obstruction or small bowel obstruction distal to this anastomosis.  He had old hematoma in the area.  He had moderate peritonitis infraumbilically less so in bilateral upper quadrants.  DESCRIPTION OF PROCEDURE:  Informed consent was confirmed.  The patient was placed on IV antibiotics.  He underwent general anesthesia without any difficulty.  He was positioned supine.  His abdomen was prepped and draped in sterile fashion.  Surgical time-out confirmed our plan.  I took out all the stitches and bluntly entered the abdominal cavity.    I easily released a large pocket of brown foul enteric feculent-like fluid in the midline and right lower quadrant.  I could easily see dehiscence of anastomosis with brisk emptying of bowel contents into the abdominal cavity.  Using careful  blunt and focused sharp dissection, I was able to free the greater omentum off the small bowel.  I found the ligament of Treitz and followed a few feet down to this inflamed midline section which had old hematoma as well as some evidence of moderate foul, phlegmon, and probable abscess more down towards the pelvis.  Eventually, using sharp and focussed blunt dissection, we were able to free small bowel up out of the pelvis and break up some interloop adhesions.  It seemed like he had some new inflammatory dense adhesions in the right lower pelvis.  Possible transition point for a new small bowel obstruction  distal to the anastomotic dehiscence.  In releasing this, I did note an enterotomy in the small bowel near by the prior breakdown.  Eventually, we were able to free off the small bowel and find healthier proximal jejunum and found the segment of noninflamed more distal jejunum distal the area of anastomosis.    I thought the anastomotic dehiscense was too large to repair primarily and did small bowel resection.  I did a staple side-to-side anastomosis using a 75 GIA and used TA 90 staples off the common defect.  I took the mesentery using the EnSeal bipolar system staying close to the small bowel.  I reapproximated the mesenteric defect using interrupted 3-0 silk stitches.  I found some healthy tongue of omentum and laid it over the fresh exposed staple line and tacked the omentum on top of it using interrupted silk stitches x3 and graham omental patch fashion to help protect the anastomosis in hopes at this time it would not break down. Of note, I did copious irrigation of the abdomen after anastomosis of 10 L of saline.  I did a final irrigation and antibiotic irrigation (900 mg clindamycin plus 240 mg gentamicin in 1000 mL of saline).  We reapproximated the fascia using #1 looped PDS with some #1 PDS interrupted internal retention sutures.  We left the skin open and packed it with some chlorhexidine-soaked gauze.  Given the fact that he came in with some tachycardia and probable fluid shifts with his renal failure, we did not feel it would be safe to extubate him at this time.  We will leave him intubated.  We will ask critical Care to become involved again.  I made an attempt to locate family but have not been successful yet.    Ardeth Sportsman, MD     SCG/MEDQ  D:  04/22/2012  T:  04/23/2012  Job:  [id number]

## 2012-04-23 NOTE — Procedures (Signed)
Extubation Procedure Note  Patient Details:   Name: [name] DOB: [birth year] MRN: [id number]   Airway Documentation:     Evaluation  O2 sats: stable throughout and currently acceptable Complications: No apparent complications Patient did tolerate procedure well. Bilateral Breath Sounds: Clear Suctioning: Airway Yes  Antoine Poche 04/23/2012, 9:00 AM

## 2012-04-23 NOTE — Progress Notes (Signed)
1 Day Post-Op  Subjective: Patient is awake, vent weaning has begun.  Objective: Vital signs in last 24 hours: Temp:  [96.9 F (36.1 C)-98.1 F (36.7 C)] 97.5 F (36.4 C) (08/02 0346) Pulse Rate:  [101-131] 130  (08/02 0700) Resp:  [13-20] 16  (08/02 0700) BP: (91-136)/(49-83) 96/65 mmHg (08/02 0700) SpO2:  [96 %-100 %] 100 % (08/02 0700) FiO2 (%):  [30 %-50 %] 30 % (08/02 0723) Weight:  [111 lb 15.9 oz (50.8 kg)] 111 lb 15.9 oz (50.8 kg) (08/02 0500) Last BM Date: 04/22/12  Intake/Output from previous day: 08/01 0701 - 08/02 0700 In: 2780 [P.O.:360; I.V.:1820; IV Piggyback:600] Out: 301 [Emesis/NG output:300; Stool:1] Intake/Output this shift:    General appearance: alert, cooperative and no distress Chest: CTA bilaterally Abdomen: Dressing C/D/I No BS, appropriately tender. Abdomen remains taut. Extremities: No edema  Lab Results:   Basename 04/22/12 0636 04/21/12 0550  WBC 9.0 7.3  HGB 9.6* 10.2*  HCT 29.1* 31.2*  PLT 172 165   BMET  Basename 04/23/12 0430 04/22/12 0636  NA 147* 140  K 4.3 5.3*  CL 105 96  CO2 21 23  GLUCOSE 108* 95  BUN 33* 69*  CREATININE 4.84* 9.45*  CALCIUM 8.4 8.6   PT/INR No results found for this basename: LABPROT:2,INR:2 in the last 72 hours ABG  Basename 04/23/12 0040  PHART 7.428  HCO3 19.5*    Studies/Results: Dg Chest Port 1 View  04/23/2012  *RADIOLOGY REPORT*  Clinical Data: Endotracheal tube placed  PORTABLE CHEST - 1 VIEW  Comparison: 04/15/2012  Findings: Endotracheal tube tip 7.2 cm proximal to the carina. Interval removal of the left IJ catheter.  Hyperinflated lungs. Mild retrocardiac opacity and left hemidiaphragm elevation.  No pneumothorax.  A enteric tube descends into the abdomen, tip not visualized.  There is nonspecific metallic density projecting over the left upper quadrant of the abdomen.  No acute osseous finding.  IMPRESSION: Endotracheal and enteric tubes as above.  Retrocardiac opacity; atelectasis,  aspiration, or infiltrate.  Hyperinflation.  Original Report Authenticated By: Waneta Martins, M.D.    Anti-infectives: Anti-infectives     Start     Dose/Rate Route Frequency Ordered Stop   04/22/12 2200   piperacillin-tazobactam (ZOSYN) IVPB 3.375 g  Status:  Discontinued        3.375 g 100 mL/hr over 30 Minutes Intravenous 3 times per day 04/22/12 2120 04/22/12 2137   04/22/12 2200  piperacillin-tazobactam (ZOSYN) IVPB 2.25 g       2.25 g 100 mL/hr over 30 Minutes Intravenous 3 times per day 04/22/12 2140     04/22/12 2157   gentamicin (GARAMYCIN) injection  Status:  Discontinued          As needed 04/22/12 2158 04/22/12 2335   04/22/12 2156   clindamycin (CLEOCIN) injection  Status:  Discontinued          As needed 04/22/12 2157 04/22/12 2335   04/22/12 2145  piperacillin-tazobactam (ZOSYN) IVPB 3.375 g       3.375 g 12.5 mL/hr over 240 Minutes Intravenous  Once 04/22/12 2141 04/23/12 0145   04/22/12 2130   gentamicin (GARAMYCIN) 240 mg, clindamycin (CLEOCIN) 900 mg in sodium chloride irrigation 0.9 % 1,000 mL irrigation  Status:  Discontinued         Irrigation  Once 04/22/12 2119 04/23/12 0020   04/14/12 0830   ertapenem (INVANZ) 1 g in sodium chloride 0.9 % 50 mL IVPB        1 g  100 mL/hr over 30 Minutes Intravenous To Surgery 04/14/12 0816 04/14/12 0834   04/13/12 1359   ertapenem (INVANZ) 1 g in sodium chloride 0.9 % 50 mL IVPB  Status:  Discontinued        1 g 100 mL/hr over 30 Minutes Intravenous 60 min pre-op 04/13/12 1359 04/14/12 0816          Assessment/Plan: s/p Procedure(s) (LRB): EXPLORATORY LAPAROTOMY (N/A) 1. NPO 2. IV ABX 3. Recheck labs in am 4. Vent management per CCM   LOS: 21 days    Brant Peets 04/23/2012

## 2012-04-23 NOTE — Progress Notes (Signed)
Subjective:  Taken back to OR last night for stool oozing from wound. Found to have dehisced anastomosis, repaired with further SB resection, washout. Left on vent overnight, extubated this morning.   Objective Filed Vitals:   04/23/12 0530 04/23/12 0600 04/23/12 0700 04/23/12 0741  BP: 108/66 99/62 96/65    Pulse: 129 131 130   Temp:    99.4 F (37.4 C)  TempSrc:    Oral  Resp: 16 15 16    Height:      Weight:      SpO2: 100% 100% 100%    Physical Exam General: weak, alert, coughing after extubation Heart: tachy reg Lungs:no wheezes or rales Abdomen: not distended, large dressing in place Extremities: no LE edema Dialysis Access: left thigh graft, do not feel thrill or hear a bruit  Dialysis Orders: Center: [facility] on TTS.  EDW 52.5 kg HD Bath 2K/2Ca Time 3.5 hrs Heparin 5000 U. Access AVG @ left thigh BFR 400 DFR 800 Zemplar 0 mcg IV/HD Epogen 0 Units IV/HD Venofer 50/week; last iPTH 314, but product high and no zemplar   Assessment/Plan:  1. SBO- s/p exp lap/SB resection 7/24 Dr. Luisa Hart- back to OR last night for dehisced anastomosis, had further SB resection and new anastomosis done. On IV zosyn, stress-dose steroids IV.  2. ESRD - HD [facility] ; significant weight loss, dry weight is lower. No vol excess, HD saturday, no heparin since post-op.  3. Hypertension/volume - was on no BP meds at home. BP soft. No gross vol excess.  4. Anemia - Hgb 9.6 Aranesp 100 started 7/28; Fe < 10; on  full course of IV Fe; ferritin 552; 5. Metabolic bone disease - Fosrenol was resumed at 1gm po TIDWC, holding since NPO 6. Protein calorie malnutrition-Alb 2.4, TNA ordered if central access can be established 7. S/p renal transplant - at [facility] in '87, started HD in 04/2003, remains on Prednisone.       8.   Hepatitis C/thrombocytopenia    Vinson Moselle  MD [facility] [phone] pgr    [phone] cell 04/23/2012, 9:39 AM     Additional Objective Labs: Basic Metabolic  Panel:  Lab 04/23/12 0430 04/22/12 0636 04/21/12 0550  NA 147* 140 141  K 4.3 5.3* 4.8  CL 105 96 97  CO2 21 23 25   GLUCOSE 108* 95 80  BUN 33* 69* 38*  CREATININE 4.84* 9.45* 6.70*  CALCIUM 8.4 8.6 8.6  ALB -- -- --  PHOS 4.3 6.4* 5.0*   Liver Function Tests:  Lab 04/22/12 0636 04/21/12 0550 04/20/12 0655  AST 17 21 --  ALT 10 11 --  ALKPHOS 60 61 --  BILITOT 1.0 1.2 --  PROT 5.7* 5.9* --  ALBUMIN 2.2* 2.4* 2.3*  CBC:  Lab 04/22/12 0636 04/21/12 0550 04/20/12 0655 04/18/12 0525 04/17/12 1930  WBC 9.0 7.3 7.4 -- --  NEUTROABS -- 5.4 -- -- --  HGB 9.6* 10.2* 9.1* -- --  HCT 29.1* 31.2* 27.7* -- --  MCV 85.8 87.2 85.8 86.6 84.8  PLT 172 165 124* -- --   Blood Culture No results found for this basename: sdes,  specrequest,  cult,  reptstatus    Medications:    . sodium chloride 50 mL/hr at 04/23/12 0927  . DISCONTD: fat emulsion    . DISCONTD: TPN (CLINIMIX) +/- additives        . antiseptic oral rinse  15 mL Mouth Rinse q12n4p  . chlorhexidine  15 mL Mouth Rinse  BID  . darbepoetin (ARANESP) injection - DIALYSIS  100 mcg Intravenous Q Sat-HD  . ferric gluconate (FERRLECIT/NULECIT) IV  125 mg Intravenous Q T,Th,Sa-HD  . hydrocortisone sodium succinate  50 mg Intravenous Q6H  . HYDROmorphone      .  HYDROmorphone (DILAUDID) injection  1 mg Intravenous Once  . insulin aspart  0-15 Units Subcutaneous Q4H  . lip balm  1 application Topical BID  . pantoprazole (PROTONIX) IV  40 mg Intravenous Q24H  . piperacillin-tazobactam (ZOSYN)  IV  2.25 g Intravenous Q8H  . piperacillin-tazobactam (ZOSYN)  IV  3.375 g Intravenous Once  . DISCONTD: bisacodyl  10 mg Rectal Daily  . DISCONTD: feeding supplement (NEPRO CARB STEADY)  237 mL Oral BID BM  . DISCONTD: feeding supplement  1 Container Oral Q24H  . DISCONTD: feeding supplement  1 Container Oral TID BM  . DISCONTD: feeding supplement  1 Container Oral Q24H  . DISCONTD: irrigation builder   Irrigation Once  . DISCONTD:  lanthanum  1,000 mg Oral TID WC  . DISCONTD: multivitamin  1 tablet Oral Daily  . DISCONTD: pantoprazole  40 mg Oral BID AC  . DISCONTD: piperacillin-tazobactam  3.375 g Intravenous Q8H  . DISCONTD: predniSONE  5 mg Oral Q breakfast

## 2012-04-23 NOTE — Procedures (Signed)
R femoral 3- lumen Trial[place] catheter placed with sterile procedure using US guidance without incident. Good blood return all 3 ports, flushed well. Dialysis lumens blocked with heparin, pigtail flushed with saline. No complications. INdication- need for dialysis access due to clotted thigh graft, and need for central IV access to administer TNA.   Vinson Moselle  MD [facility] [phone] pgr    [phone] cell 04/23/2012, 12:51 PM

## 2012-04-23 NOTE — Progress Notes (Signed)
ANTIFUNGAL CONSULT NOTE - FOLLOW UP  Pharmacy Consult for Fluconazole Indication: Peritonitis/SB Leak  Allergies  Allergen Reactions  . Allopurinol     REACTION: decreased platelets  . Aspirin     REACTION: unspecified    Patient Measurements: Height: 5\' 11"  (180.3 cm) Weight: 111 lb 15.9 oz (50.8 kg) IBW/kg (Calculated) : 75.3   Vital Signs: Temp: 98.9 F (37.2 C) (08/02 1130) Temp src: Oral (08/02 1130) BP: 119/66 mmHg (08/02 1200) Pulse Rate: 113  (08/02 1200) Intake/Output from previous day: 08/01 0701 - 08/02 0700 In: 2780 [P.O.:360; I.V.:1820; IV Piggyback:600] Out: 301 [Emesis/NG output:300; Stool:1] Intake/Output from this shift: Total I/O In: 695 [I.V.:135; NG/GT:60; IV Piggyback:500] Out: 200 [Emesis/NG output:200]  Labs:  Basename 04/23/12 0430 04/22/12 0636 04/21/12 0550  WBC -- 9.0 7.3  HGB -- 9.6* 10.2*  PLT -- 172 165  LABCREA -- -- --  CREATININE 4.84* 9.45* 6.70*   Estimated Creatinine Clearance: 12.8 ml/min (by C-G formula based on Cr of 4.84). No results found for this basename: VANCOTROUGH:2,VANCOPEAK:2,VANCORANDOM:2,GENTTROUGH:2,GENTPEAK:2,GENTRANDOM:2,TOBRATROUGH:2,TOBRAPEAK:2,TOBRARND:2,AMIKACINPEAK:2,AMIKACINTROU:2,AMIKACIN:2, in the last 72 hours   Microbiology: Recent Results (from the past 720 hour(s))  MRSA PCR SCREENING     Status: Normal   Collection Time   04/03/12 10:10 AM      Component Value Range Status Comment   MRSA by PCR NEGATIVE  NEGATIVE Final     Anti-infectives     Start     Dose/Rate Route Frequency Ordered Stop   04/23/12 1600   fluconazole (DIFLUCAN) IVPB 400 mg        400 mg 200 mL/hr over 60 Minutes Intravenous  Once 04/23/12 1428     04/22/12 2200   piperacillin-tazobactam (ZOSYN) IVPB 3.375 g  Status:  Discontinued        3.375 g 100 mL/hr over 30 Minutes Intravenous 3 times per day 04/22/12 2120 04/22/12 2137   04/22/12 2200  piperacillin-tazobactam (ZOSYN) IVPB 2.25 g       2.25 g 100 mL/hr over 30  Minutes Intravenous 3 times per day 04/22/12 2140     04/22/12 2157   gentamicin (GARAMYCIN) injection  Status:  Discontinued          As needed 04/22/12 2158 04/22/12 2335   04/22/12 2156   clindamycin (CLEOCIN) injection  Status:  Discontinued          As needed 04/22/12 2157 04/22/12 2335   04/22/12 2145  piperacillin-tazobactam (ZOSYN) IVPB 3.375 g       3.375 g 12.5 mL/hr over 240 Minutes Intravenous  Once 04/22/12 2141 04/23/12 0145   04/22/12 2130   gentamicin (GARAMYCIN) 240 mg, clindamycin (CLEOCIN) 900 mg in sodium chloride irrigation 0.9 % 1,000 mL irrigation  Status:  Discontinued         Irrigation  Once 04/22/12 2119 04/23/12 0020   04/14/12 0830   ertapenem (INVANZ) 1 g in sodium chloride 0.9 % 50 mL IVPB        1 g 100 mL/hr over 30 Minutes Intravenous To Surgery 04/14/12 0816 04/14/12 0834   04/13/12 1359   ertapenem (INVANZ) 1 g in sodium chloride 0.9 % 50 mL IVPB  Status:  Discontinued        1 g 100 mL/hr over 30 Minutes Intravenous 60 min pre-op 04/13/12 1359 04/14/12 0816          Assessment: Pt is a 32 YOM with ESRD on HD who was admitted 7/13 for SBO and underwent small bowel resection on 7/24. Pt underwent surgery  again on the night onf 8/1 for acute abdomen/enteric content leak/probable anastomotic leak. Pt again had SB resection and was found to have peritonitis. Pt is currently on Zosyn 2.25g q8h and pharmacy is now consulted to dose fluconazole to ensure fungal peritonitis coverage. WBC 9, Tmax 99.4, pt is now extubated and plans are for HD on Saturday.   Goal of Therapy:  Eradication of infection  Plan:  1. Initiate fluconazole 400 mg IV x 1 today, then provide fluconazole 400 mg IV post-HD if the patient remains on normal HD plan 2. F/U HD plans 3. Continue Zosyn 2.25g q8h 4. Follow clinical progression  Abran Duke, PharmD Clinical Pharmacist Phone: [phone] Pager: [phone] 04/23/2012 2:40 PM

## 2012-04-23 NOTE — Progress Notes (Signed)
Patient interviewed and examined, agree with NP note above. Now extubated, actually very comfortable.  Wound clean Will need TNA-discussed access issues with Dr Lenn Cal MD, FACS  04/23/2012 2:14 PM

## 2012-04-23 NOTE — Progress Notes (Addendum)
PARENTERAL NUTRITION CONSULT NOTE - INITIAL  Pharmacy Consult for TPN Indication: SBO, s/p LOA/SBR/anastamosis pod#7, s/p LOA/SBR/re-anastamosis pod#1  Allergies  Allergen Reactions  . Allopurinol     REACTION: decreased platelets  . Aspirin     REACTION: unspecified    Patient Measurements: Height: 5\' 11"  (180.3 cm) Weight: 111 lb 15.9 oz (50.8 kg) IBW/kg (Calculated) : 75.3  Adjusted Body Weight: 50.8  Vital Signs: Temp: 99.4 F (37.4 C) (08/02 0741) Temp src: Oral (08/02 0741) BP: 91/56 mmHg (08/02 1000) Pulse Rate: 122  (08/02 1000) Intake/Output from previous day: 08/01 0701 - 08/02 0700 In: 2780 [P.O.:360; I.V.:1820; IV Piggyback:600] Out: 301 [Emesis/NG output:300; Stool:1] Intake/Output from this shift: Total I/O In: 90 [I.V.:60; NG/GT:30] Out: 100 [Emesis/NG output:100]  Labs:  [facility] 04/22/12 0636 04/21/12 0550  WBC 9.0 7.3  HGB 9.6* 10.2*  HCT 29.1* 31.2*  PLT 172 165  APTT -- --  INR -- --     Basename 04/23/12 0430 04/22/12 0636 04/21/12 0550  NA 147* 140 141  K 4.3 5.3* 4.8  CL 105 96 97  CO2 21 23 25   GLUCOSE 108* 95 80  BUN 33* 69* 38*  CREATININE 4.84* 9.45* 6.70*  LABCREA -- -- --  CREAT24HRUR -- -- --  CALCIUM 8.4 8.6 8.6  MG 1.9 2.2 2.1  PHOS 4.3 6.4* 5.0*  PROT -- 5.7* 5.9*  ALBUMIN -- 2.2* 2.4*  AST -- 17 21  ALT -- 10 11  ALKPHOS -- 60 61  BILITOT -- 1.0 1.2  BILIDIR -- -- --  IBILI -- -- --  PREALBUMIN -- -- 9.5*  TRIG -- -- 93  CHOLHDL -- -- --  CHOL -- -- 102   Estimated Creatinine Clearance: 12.8 ml/min (by C-G formula based on Cr of 4.84).    Basename 04/23/12 0345 04/22/12 2342 04/22/12 1720  GLUCAP 111* 118* 93    Medical History: Past Medical History  Diagnosis Date  . Dialysis patient   . Prostate cancer     Medications:  Scheduled:    . antiseptic oral rinse  15 mL Mouth Rinse q12n4p  . chlorhexidine  15 mL Mouth Rinse BID  . darbepoetin (ARANESP) injection - DIALYSIS  100 mcg Intravenous Q  Sat-HD  . ferric gluconate (FERRLECIT/NULECIT) IV  125 mg Intravenous Q T,Th,Sa-HD  . hydrocortisone sodium succinate  50 mg Intravenous Q6H  . HYDROmorphone      .  HYDROmorphone (DILAUDID) injection  1 mg Intravenous Once  . insulin aspart  0-15 Units Subcutaneous Q4H  . lip balm   Topical BID  . pantoprazole (PROTONIX) IV  40 mg Intravenous Q24H  . piperacillin-tazobactam (ZOSYN)  IV  2.25 g Intravenous Q8H  . piperacillin-tazobactam (ZOSYN)  IV  3.375 g Intravenous Once  . DISCONTD: bisacodyl  10 mg Rectal Daily  . DISCONTD: feeding supplement (NEPRO CARB STEADY)  237 mL Oral BID BM  . DISCONTD: feeding supplement  1 Container Oral Q24H  . DISCONTD: irrigation builder   Irrigation Once  . DISCONTD: lanthanum  1,000 mg Oral TID WC  . DISCONTD: lip balm  1 application Topical BID  . DISCONTD: multivitamin  1 tablet Oral Daily  . DISCONTD: pantoprazole  40 mg Oral BID AC  . DISCONTD: piperacillin-tazobactam  3.375 g Intravenous Q8H  . DISCONTD: predniSONE  5 mg Oral Q breakfast    Insulin Requirements in the past 24 hours:  Currently on Moderate SSI q4, requiring no coverage  Current Nutrition:  NPO  Admit:  Admitted 7/13 with abd pain, SBO  GI-  SBO, back to OR last PM 2nd stool leakage from anastamosis- LOA, SBR & anastamosis.  Has been essentially NPO since admit, with significant wt loss.  No access for TPN- to go to IR today, unsure time & poor vasculature 2nd ESRD, has failed PICC x 1 already.  Significant risk for re-feeding. Endo: Moderate SSI q4, on Solu-Cortef.  Serum gluc 108 this AM Lytes:  Na 147, otherwise wnl.  Expect re-feeding.  Will watch lytes closely Renal:  ESRD on MWF HD.  Ferrlecit, Aranep-Sat Pulm: [place]-2L Cards: 91/56, 122.   Hepatobil: LFTs wnl on 8/1. Neuro:  Fentanyl prn for severe pain ID:    T 99.4.  Lower abdominal peritonitis- on renally adjusted Zosyn Best Practices:  Mouthcare, PPI-IV  Nutritional Goals:  1700-1950 kCal, 75-85 grams of  protein per day  Plan:  1.  Clinimix 5/15 at 52ml/hr 2.  TPN labs, will not check cholesterol, TG, prealbumin in AM as drawn this week already. 3.  Watch closely for re-feeding syndrome  Marisue Humble, PharmD Clinical Pharmacist [facility]

## 2012-04-24 ENCOUNTER — Inpatient Hospital Stay (HOSPITAL_COMMUNITY): Payer: Medicare Other

## 2012-04-24 DIAGNOSIS — I498 Other specified cardiac arrhythmias: Secondary | ICD-10-CM

## 2012-04-24 LAB — BASIC METABOLIC PANEL
BUN: 31 mg/dL — ABNORMAL HIGH (ref 6–23)
Calcium: 9.1 mg/dL (ref 8.4–10.5)
GFR calc non Af Amer: 17 mL/min — ABNORMAL LOW (ref >90)
Glucose, Bld: 136 mg/dL — ABNORMAL HIGH (ref 70–99)
Sodium: 139 mEq/L (ref 135–145)

## 2012-04-24 LAB — CBC
HCT: 22.6 % — ABNORMAL LOW (ref 39.0–52.0)
Hemoglobin: 7.5 g/dL — ABNORMAL LOW (ref 13.0–17.0)
MCH: 27.2 pg (ref 26.0–34.0)
MCH: 27.7 pg (ref 26.0–34.0)
MCHC: 32.2 g/dL (ref 30.0–36.0)
MCHC: 33.2 g/dL (ref 30.0–36.0)
MCV: 84.6 fL (ref 78.0–100.0)
Platelets: 194 10*3/uL (ref 150–400)
RDW: 13.8 % (ref 11.5–15.5)

## 2012-04-24 LAB — COMPREHENSIVE METABOLIC PANEL
ALT: 9 U/L (ref 0–53)
AST: 25 U/L (ref 0–37)
Calcium: 8.6 mg/dL (ref 8.4–10.5)
Creatinine, Ser: 6.87 mg/dL — ABNORMAL HIGH (ref 0.50–1.35)
GFR calc Af Amer: 10 mL/min — ABNORMAL LOW (ref >90)
GFR calc non Af Amer: 8 mL/min — ABNORMAL LOW (ref >90)
Glucose, Bld: 143 mg/dL — ABNORMAL HIGH (ref 70–99)
Sodium: 144 mEq/L (ref 135–145)
Total Protein: 5.4 g/dL — ABNORMAL LOW (ref 6.0–8.3)

## 2012-04-24 LAB — GLUCOSE, CAPILLARY
Glucose-Capillary: 114 mg/dL — ABNORMAL HIGH (ref 70–99)
Glucose-Capillary: 194 mg/dL — ABNORMAL HIGH (ref 70–99)

## 2012-04-24 LAB — CARDIAC PANEL(CRET KIN+CKTOT+MB+TROPI)
Relative Index: INVALID (ref 0.0–2.5)
Troponin I: 1.18 ng/mL (ref <0.30)

## 2012-04-24 LAB — PHOSPHORUS: Phosphorus: 5.2 mg/dL — ABNORMAL HIGH (ref 2.3–4.6)

## 2012-04-24 MED ORDER — METOPROLOL TARTRATE 1 MG/ML IV SOLN
5.0000 mg | Freq: Four times a day (QID) | INTRAVENOUS | Status: DC
  Administered 2012-04-24 – 2012-05-22 (×65): 5 mg via INTRAVENOUS
  Filled 2012-04-24 (×123): qty 5

## 2012-04-24 MED ORDER — MORPHINE SULFATE 2 MG/ML IJ SOLN
INTRAMUSCULAR | Status: AC
  Administered 2012-04-24: 2 mg via INTRAVENOUS
  Filled 2012-04-24: qty 1

## 2012-04-24 MED ORDER — NALOXONE HCL 0.4 MG/ML IJ SOLN: 0.4000 mg | INTRAMUSCULAR | Status: DC | PRN

## 2012-04-24 MED ORDER — DIPHENHYDRAMINE HCL 50 MG/ML IJ SOLN: 12.5000 mg | Freq: Four times a day (QID) | INTRAMUSCULAR | Status: DC | PRN

## 2012-04-24 MED ORDER — MORPHINE SULFATE 2 MG/ML IJ SOLN
2.0000 mg | INTRAMUSCULAR | Status: DC | PRN
  Administered 2012-04-24 – 2012-04-25 (×4): 2 mg via INTRAVENOUS
  Filled 2012-04-24 (×3): qty 1

## 2012-04-24 MED ORDER — DARBEPOETIN ALFA-POLYSORBATE 200 MCG/0.4ML IJ SOLN
200.0000 ug | INTRAMUSCULAR | Status: DC
  Administered 2012-04-24 – 2012-05-15 (×4): 200 ug via INTRAVENOUS
  Filled 2012-04-24 (×5): qty 0.4

## 2012-04-24 MED ORDER — DIPHENHYDRAMINE HCL 12.5 MG/5ML PO ELIX
12.5000 mg | ORAL_SOLUTION | Freq: Four times a day (QID) | ORAL | Status: DC | PRN
  Filled 2012-04-24: qty 5

## 2012-04-24 MED ORDER — CLINIMIX/DEXTROSE (5/15) 5 % IV SOLN
INTRAVENOUS | Status: DC
  Filled 2012-04-24: qty 1600

## 2012-04-24 MED ORDER — MORPHINE SULFATE (PF) 1 MG/ML IV SOLN: INTRAVENOUS | Status: DC

## 2012-04-24 MED ORDER — FENTANYL CITRATE 0.05 MG/ML IJ SOLN
25.0000 ug | INTRAMUSCULAR | Status: DC | PRN
  Administered 2012-04-24: 50 ug via INTRAVENOUS
  Filled 2012-04-24: qty 2

## 2012-04-24 MED ORDER — SODIUM CHLORIDE 0.9 % IJ SOLN: 9.0000 mL | INTRAMUSCULAR | Status: DC | PRN

## 2012-04-24 MED ORDER — ONDANSETRON HCL 4 MG/2ML IJ SOLN: 4.0000 mg | Freq: Four times a day (QID) | INTRAMUSCULAR | Status: DC | PRN

## 2012-04-24 MED ORDER — DARBEPOETIN ALFA-POLYSORBATE 200 MCG/0.4ML IJ SOLN
INTRAMUSCULAR | Status: AC
  Administered 2012-04-24: 200 ug via INTRAVENOUS
  Filled 2012-04-24: qty 0.4

## 2012-04-24 MED ORDER — MICAFUNGIN SODIUM 50 MG IV SOLR
100.0000 mg | Freq: Every day | INTRAVENOUS | Status: DC
  Administered 2012-04-24 – 2012-05-07 (×13): 100 mg via INTRAVENOUS
  Filled 2012-04-24 (×18): qty 100

## 2012-04-24 MED ORDER — CLINIMIX/DEXTROSE (5/15) 5 % IV SOLN
INTRAVENOUS | Status: AC
  Administered 2012-04-24: 18:00:00 via INTRAVENOUS
  Filled 2012-04-24: qty 2000

## 2012-04-24 NOTE — Progress Notes (Signed)
Patient transferred to 2506 via chair. Report given to receiving nurse at bedside. Patient alert and oriented x 4 and vital signs stable and within normal limits for patient. Patient oriented to room and call bell given to patient. Patient in bedside chair in the reclined position. Receiving nurse shown wounds to abd and left groin and HD cath in right groin as well as PIC in right wrist. Patient's belongings taken with patient and meds given to receiving nurses.

## 2012-04-24 NOTE — Progress Notes (Signed)
Called by staff patient with tachycardia, new compared to previous   We will obtain EKG and cardiac enzymes, routine electrolytes.

## 2012-04-24 NOTE — Progress Notes (Signed)
PARENTERAL NUTRITION CONSULT NOTE - FOLLOW UP  Pharmacy Consult for TPN Indication: SBO, s/p LOA/SBR/anastamosis , s/p LOA/SBR/re-anastamosis   Allergies  Allergen Reactions  . Allopurinol     REACTION: decreased platelets  . Aspirin     REACTION: unspecified    Patient Measurements: Height: 5\' 11"  (180.3 cm) Weight: 107 lb 5.8 oz (48.7 kg) IBW/kg (Calculated) : 75.3  Adjusted Body Weight: 50.8  Vital Signs: Temp: 97.8 F (36.6 C) (08/03 0811) Temp src: Oral (08/03 0811) BP: 189/96 mmHg (08/03 0700) Pulse Rate: 106  (08/03 0700) Intake/Output from previous day: 08/02 0701 - 08/03 0700 In: 2090 [I.V.:960; NG/GT:180; IV Piggyback:860; TPN:90] Out: 500 [Emesis/NG output:500] Intake/Output from this shift:    Labs:  [facility] 04/24/12 0411 04/22/12 0636  WBC 17.9* 9.0  HGB 7.4* 9.6*  HCT 23.0* 29.1*  PLT 194 172  APTT -- --  INR -- --     Basename 04/24/12 0411 04/23/12 0430 04/22/12 0636  NA 144 147* 140  K 4.9 4.3 5.3*  CL 106 105 96  CO2 22 21 23   GLUCOSE 143* 108* 95  BUN 56* 33* 69*  CREATININE 6.87* 4.84* 9.45*  LABCREA -- -- --  CREAT24HRUR -- -- --  CALCIUM 8.6 8.4 8.6  MG 2.0 1.9 2.2  PHOS 5.2* 4.3 6.4*  PROT 5.4* -- 5.7*  ALBUMIN 1.8* -- 2.2*  AST 25 -- 17  ALT 9 -- 10  ALKPHOS 55 -- 60  BILITOT 1.2 -- 1.0  BILIDIR -- -- --  IBILI -- -- --  PREALBUMIN -- -- --  TRIG -- -- --  CHOLHDL -- -- --  CHOL -- -- --   Estimated Creatinine Clearance: 8.7 ml/min (by C-G formula based on Cr of 6.87).    Basename 04/24/12 0721 04/24/12 0344 04/24/12 0023  GLUCAP 114* 130* 194*    Medical History: Past Medical History  Diagnosis Date  . Dialysis patient   . Prostate cancer     Medications:  Scheduled:     . antiseptic oral rinse  15 mL Mouth Rinse q12n4p  . darbepoetin (ARANESP) injection - DIALYSIS  100 mcg Intravenous Q Sat-HD  . ferric gluconate (FERRLECIT/NULECIT) IV  125 mg Intravenous Q T,Th,Sa-HD  . fluconazole (DIFLUCAN) IV   400 mg Intravenous Once  . hydrocortisone sodium succinate  50 mg Intravenous Q12H  . HYDROmorphone      . insulin aspart  0-15 Units Subcutaneous Q4H  . lip balm   Topical BID  . micafungin [facility]) IV  100 mg Intravenous Daily  . morphine   Intravenous Q4H  . pantoprazole (PROTONIX) IV  40 mg Intravenous Q24H  . piperacillin-tazobactam (ZOSYN)  IV  2.25 g Intravenous Q8H  . sodium chloride  500 mL Intravenous Once  . sodium chloride  10-40 mL Intracatheter Q12H  . DISCONTD: chlorhexidine  15 mL Mouth Rinse BID  . DISCONTD: hydrocortisone sodium succinate  50 mg Intravenous Q6H  . DISCONTD: lip balm  1 application Topical BID    Insulin Requirements in the past 24 hours:  8 units Novolog. Currently on Moderate SSI q  Current Nutrition:  NPO  Admit:  Admitted 7/13 with abd pain, SBO  GI-  SBO, back to OR 8.1 2nd stool leakage from anastamosis- LOA, SBR & anastamosis.  Has been essentially NPO since admit, with significant wt loss.  Central line placed 8.2 with Triple lumen HD cath.  Significant risk for re-feeding. Tolerated TPN at 38ml/hr very well. I/O not being accurately documented.  Will order strict I/O. Ok to increase tpn rate Endo: Moderate SSI q4, on Solu-Cortef.  Serum gluc 114-194 last 24 h. Most cbg within goal this AM. May need to add insulin when increasing tpn rate Lytes:  Lytes wnl  Expect re-feeding.  Renal:  ESRD on T/TH/Sat HD.  Ferrlecit, Aranep-Sat Pulm: extubated; 99% sat on 2L [place] Cards: hypertensive this am 189/76. Tachy. Started lopressor Hepatobil: LFTs wnl on 8/1. Neuro:  Fentanyl prn for severe pain ID:    T 99.4.  Lower abdominal peritonitis- on renally adjusted Zosyn and diflucan Best Practices:  Mouthcare, PPI-IV  Nutritional Goals:  1700-1950 kCal, 75-85 grams of protein per day  Plan:  1.  Increase Clinimix 5/15 to 60 ml/hr, which is 75% of goal rate. 2.  F/u TPN labs 3.  Watch closely for re-feeding syndrome  Verlene Mayer, PharmD,  BCPS Pager [phone]

## 2012-04-24 NOTE — Progress Notes (Signed)
2 Days Post-Op  Subjective: C/o pain. No flatus. HTN yesterday. Line placed and started on TPN  Objective: Vital signs in last 24 hours: Temp:  [97.6 F (36.4 C)-98.9 F (37.2 C)] 97.6 F (36.4 C) (08/03 0345) Pulse Rate:  [98-124] 106  (08/03 0700) Resp:  [14-23] 21  (08/03 0700) BP: (91-189)/(56-102) 189/96 mmHg (08/03 0700) SpO2:  [99 %-100 %] 99 % (08/03 0700) Weight:  [107 lb 5.8 oz (48.7 kg)] 107 lb 5.8 oz (48.7 kg) (08/03 0500) Last BM Date: 04/22/12  Intake/Output from previous day: 08/02 0701 - 08/03 0700 In: 2090 [I.V.:960; NG/GT:180; IV Piggyback:860; TPN:90] Out: 500 [Emesis/NG output:500] Intake/Output this shift:    Alert, nad, sitting in chair cta ant, decrease BS at bases Tachy Soft, mild distension. Fascia closed, skin open. Few BS  Lab Results:   [facility] 04/24/12 0411 04/22/12 0636  WBC 17.9* 9.0  HGB 7.4* 9.6*  HCT 23.0* 29.1*  PLT 194 172   BMET  Basename 04/24/12 0411 04/23/12 0430  NA 144 147*  K 4.9 4.3  CL 106 105  CO2 22 21  GLUCOSE 143* 108*  BUN 56* 33*  CREATININE 6.87* 4.84*  CALCIUM 8.6 8.4   PT/INR No results found for this basename: LABPROT:2,INR:2 in the last 72 hours ABG  Basename 04/23/12 0040  PHART 7.428  HCO3 19.5*    Studies/Results: Dg Chest Port 1 View  04/23/2012  *RADIOLOGY REPORT*  Clinical Data: Endotracheal tube placed  PORTABLE CHEST - 1 VIEW  Comparison: 04/15/2012  Findings: Endotracheal tube tip 7.2 cm proximal to the carina. Interval removal of the left IJ catheter.  Hyperinflated lungs. Mild retrocardiac opacity and left hemidiaphragm elevation.  No pneumothorax.  A enteric tube descends into the abdomen, tip not visualized.  There is nonspecific metallic density projecting over the left upper quadrant of the abdomen.  No acute osseous finding.  IMPRESSION: Endotracheal and enteric tubes as above.  Retrocardiac opacity; atelectasis, aspiration, or infiltrate.  Hyperinflation.  Original Report Authenticated  By: Waneta Martins, M.D.    Anti-infectives: Anti-infectives     Start     Dose/Rate Route Frequency Ordered Stop   04/24/12 1000   micafungin (MYCAMINE) 100 mg in sodium chloride 0.9 % 100 mL IVPB        100 mg 100 mL/hr over 1 Hours Intravenous Daily 04/24/12 0750     04/23/12 1600   fluconazole (DIFLUCAN) IVPB 400 mg        400 mg 200 mL/hr over 60 Minutes Intravenous  Once 04/23/12 1428 04/23/12 1800   04/22/12 2200   piperacillin-tazobactam (ZOSYN) IVPB 3.375 g  Status:  Discontinued        3.375 g 100 mL/hr over 30 Minutes Intravenous 3 times per day 04/22/12 2120 04/22/12 2137   04/22/12 2200   piperacillin-tazobactam (ZOSYN) IVPB 2.25 g        2.25 g 100 mL/hr over 30 Minutes Intravenous 3 times per day 04/22/12 2140     04/22/12 2157   gentamicin (GARAMYCIN) injection  Status:  Discontinued          As needed 04/22/12 2158 04/22/12 2335   04/22/12 2156   clindamycin (CLEOCIN) injection  Status:  Discontinued          As needed 04/22/12 2157 04/22/12 2335   04/22/12 2145   piperacillin-tazobactam (ZOSYN) IVPB 3.375 g        3.375 g 12.5 mL/hr over 240 Minutes Intravenous  Once 04/22/12 2141 04/23/12 0145  04/22/12 2130   gentamicin (GARAMYCIN) 240 mg, clindamycin (CLEOCIN) 900 mg in sodium chloride irrigation 0.9 % 1,000 mL irrigation  Status:  Discontinued         Irrigation  Once 04/22/12 2119 04/23/12 0020   04/14/12 0830   ertapenem (INVANZ) 1 g in sodium chloride 0.9 % 50 mL IVPB        1 g 100 mL/hr over 30 Minutes Intravenous To Surgery 04/14/12 0816 04/14/12 0834   04/13/12 1359   ertapenem (INVANZ) 1 g in sodium chloride 0.9 % 50 mL IVPB  Status:  Discontinued        1 g 100 mL/hr over 30 Minutes Intravenous 60 min pre-op 04/13/12 1359 04/14/12 0816          Assessment/Plan: s/p Procedure(s) (LRB): EXPLORATORY LAPAROTOMY FOR ANASTOMOTIC LEAK, SB RESECTION, OMENTAL PATCH ESRD HTN PCMN Anemia  Bowel rest, npo, ng tube Cont IV abx Follow  hgb. Transfuse for hgb <7 Cont TPN HD per renal Will add PCA for pain control CCM added meds for BP  Mary Sella. Andrey Campanile, MD, FACS General, Bariatric, & Minimally Invasive Surgery [facility], [place]   LOS: 22 days    Atilano Ina 04/24/2012

## 2012-04-24 NOTE — Progress Notes (Signed)
Pt transferred back to unit by HD staff.

## 2012-04-24 NOTE — Progress Notes (Signed)
Pt transferred off floor to HD via Willaim Sheng, HD RN.  Report given to RN.

## 2012-04-24 NOTE — Progress Notes (Signed)
Name: GADDIEL CULLENS MRN: [id number] DOB: [birth year]    LOS: 22  Referring Provider:  [facility] Reason for Referral:  Vent management  PULMONARY / CRITICAL CARE MEDICINE  Brief patient description:  51 y/o male with ESRD who was admitted on 7/13 with SBO, underwent lysis of adhesions and partial small bowel resection x2 on 7/24 and later developed a clot in his L femoral AV graft requiring thrombectomy on 7/25.  PCCM consulted for vent management.  Events Since Admission: 7/13  CT Abdomen with SBO 7/24  Ex-lap, lysis of adhesions, partial small bowel resection x2 7/25  Thrombectomy L femoral AV graft 7/26   Extubated, CVL is out 7/27  Transferred to telemetry under TRH 8/1     Acute abdomen, dehiscence of anastomosis, SBO, back from OR intubated in ICU on PCCM service Op findings - small bowel anastomosis had completely opened up to 3-cm opening with frank succus in the abdomen.  He seemed to have a kinked up loop of small bowel in the pelvis suspicious for distal small bowel obstruction or small bowel obstruction distal to this anastomosis. He had old hematoma in the area. He had moderateperitonitisinfraumbilically less so in bilateral upper quadrants.  Current Status:  No acute overnight events.  Intermittently hypotensive.  Vital Signs: Temp:  [97.6 F (36.4 C)-98.9 F (37.2 C)] 97.6 F (36.4 C) (08/03 0345) Pulse Rate:  [98-126] 106  (08/03 0700) Resp:  [14-23] 21  (08/03 0700) BP: (91-189)/(56-102) 189/96 mmHg (08/03 0700) SpO2:  [99 %-100 %] 99 % (08/03 0700) FiO2 (%):  [30 %] 30 % (08/02 0800) Weight:  [48.7 kg (107 lb 5.8 oz)] 48.7 kg (107 lb 5.8 oz) (08/03 0500)  Physical Examination: Gen: Sitting in the chair in no distress HEENT: PERRL, NGT PULM: Bilateral diminished breath sounds CV: RRR AB: Clean surgical dressing, diminished bowel sounds Ext: No edema Derm: Skin intact Neuro: Awake, alert and cooperative  Principal Problem:  *SBO (small bowel obstruction) s/p  EL/LOA and SBR x 2 Active Problems:  CKD (chronic kidney disease) stage V requiring chronic dialysis  Erythrocytosis  Thrombocytopenia  Gout  HTN (hypertension)  Ileus following gastrointestinal surgery  Chronic use of steroids  Malnutrition, calorie  Leg graft occlusion  Acute respiratory failure with hypoxia  Sinus tachycardia  ASSESSMENT AND PLAN  PULMONARY  ETT:   7/25 >>> 7/26 8/1 >>> 8/2  A:  Post operative respiratory failure. COPD? Emphysema on CXR. P:   Supplemental oxygen to keep SpO2 > 92%  CARDIOVASCULAR  Lines: NA  A: Hemodynamically stable.  No arrhythmia / ischemia. 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  L femoral vein thrombus, s/p thrombectomy 7/25.  Hypertension / Tachycardia. P:  IV access via pigtail of HD cath Hydralazine PRN Start Metoprolol 5 mg IV q6h, hold for SBP < 160  RENAL  Lab 04/24/12 0411 04/23/12 0430 04/22/12 0636 04/21/12 0550 04/20/12 0655  NA 144 147* 140 141 136  K 4.9 4.3 -- -- --  CL 106 105 96 97 96  CO2 22 21 23 25 25   BUN 56* 33* 69* 38* 60*  CREATININE 6.87* 4.84* 9.45* 6.70* 10.31*  CALCIUM 8.6 8.4 8.6 8.6 8.6  MG 2.0 1.9 2.2 2.1 --  PHOS 5.2* 4.3 6.4* 5.0* 4.7*   Intake/Output      08/02 0701 - 08/03 0700 08/03 0701 - 08/04 0700   P.O.     I.V. (mL/kg) 960 (19.7)    NG/GT 180  IV Piggyback 860    TPN 90    Total Intake(mL/kg) 2090 (42.9)    Emesis/NG output 500    Stool     Total Output 500    Net +1590          Foley:  Anuric  A:  ESRD, s/p thrombectomy 7/25 L fem AV graft.  Mild hyperkalemia.  Hyperphosphatemia. P:   Per Renal Prednisone discontinued as now on stress dose steroids (was on as outpt likely for hx of renal transplant)  GASTROINTESTINAL  Lab 04/24/12 0411 04/22/12 0636 04/21/12 0550 04/20/12 0655 04/17/12 1930  AST 25 17 21  -- --  ALT 9 10 11  -- --  ALKPHOS 55 60 61 -- --  BILITOT 1.2 1.0 1.2 -- --  PROT 5.4* 5.7* 5.9* -- --  ALBUMIN 1.8* 2.2* 2.4*  2.3* 2.6*   A:  SBO, s/p lysis of adhesions and partial small bowel resection 7/24; post op ileus.  Anastomosis dehiscence / SBO s/p lysis of adhesions, small bowel resection, omental patch of anastomosis 8/1.  Severe malnutrition. P:   NGT to Sx NPO TNA Post op care per Surgery  HEMATOLOGIC  Lab 04/24/12 0411 04/22/12 0636 04/21/12 0550 04/20/12 0655 04/18/12 0525  HGB 7.4* 9.6* 10.2* 9.1* 9.6*  HCT 23.0* 29.1* 31.2* 27.7* 29.0*  PLT 194 172 165 124* 118*  INR -- -- -- -- --  APTT -- -- -- -- --   A:  Postop anemia. P:  Trend CBC intermittently Aranesp Ferric gluconate Transfuse if Hb<7  INFECTIOUS  Lab 04/24/12 0411 04/22/12 0636 04/21/12 0550 04/20/12 0655 04/18/12 0525  WBC 17.9* 9.0 7.3 7.4 6.1  PROCALCITON -- -- -- -- --   Cultures:  NA  Antibiotics:  NA Zosyn 8/1 >>>  A:  Suspected peritonitis. P:  Empiric coverage Trend CBC D/c Diflucan, add Micafungin  ENDOCRINE  Lab 04/24/12 0344 04/24/12 0023 04/23/12 1935 04/23/12 1540 04/23/12 1223  GLUCAP 130* 194* 159* 116* 115*   A:  Stress dose steroids.  Risk of hyperglycemia / hypoglycemia. P:   Stress dose steroids - if concern for wound healing, consider stopping SSI / CBG  NEUROLOGIC  A:  Post op pain/comfort P:   D/c Fentanyl Intermittent Morphine  BEST PRACTICE / DISPOSITION Level of Care:  Transfer to SDU Primary Service:  Transfer to [facility] care Consultants:  Vascular surgery, CCS, Renal Code Status:  Full Diet:  NPO, TNA DVT Px:  SCDs GI Px:  Protonix Skin Integrity:  No issues Social / Family:  Not available during rounds  Vangie Henthorn  04/24/2012, 7:42 AM

## 2012-04-24 NOTE — Progress Notes (Addendum)
Pt has a few patches of black tissue throughout mid abdominal surgical incision.  Incision was repacked with wet (sterile NS) guaze and covered with dry gauze and abdominal pad.  Salomon Mast, RN

## 2012-04-24 NOTE — Progress Notes (Signed)
Subjective:  Up out of bed, NG in, no complaints. Not having much pain per RN  Objective Filed Vitals:   04/24/12 0630 04/24/12 0700 04/24/12 0800 04/24/12 0811  BP:  189/96 184/85   Pulse: 106 106 115   Temp:    97.8 F (36.6 C)  TempSrc:    Oral  Resp: 20 21 23    Height:      Weight:      SpO2: 100% 99% 100%    Physical Exam General: weak, alert, coughing after extubation Heart: tachy reg Lungs:no wheezes or rales Abdomen: not distended, large dressing in place Extremities: no LE edema Dialysis Access: left thigh graft, do not feel thrill or hear a bruit  Dialysis Orders: Center: [facility] on TTS.  EDW 52.5 kg HD Bath 2K/2Ca Time 3.5 hrs Heparin 5000 U. Access AVG @ left thigh BFR 400 DFR 800 Zemplar 0 mcg IV/HD Epogen 0 Units IV/HD Venofer 50/week; last iPTH 314, but product high and no zemplar   Assessment/Plan:  1. SBO, s/p exp lap/SB resection 7/24 Dr. Luisa Hart, c/b dehiscence with peritonitis, back to OR 8/1 for further SB resection and reanastomosis. On IV abx. Would like to avoid PCA if possible, would prefer prn narcotics given by staff. Discussed with nurse. 2. ESRD - HD TTS at [facility]. No hep HD today due to post op. HD via temp cath 3. Clotted L thigh AVG- will notify [facility] to consider declot when stable enough 4. Hypertension/volume - was on no BP meds at home. BP soft. No gross vol excess.  5. Anemia - Hgb 9.6 Aranesp 100 started 7/28; Fe < 10; on  full course of IV Fe; ferritin 552; 6. Metabolic bone disease - Fosrenol was resumed at 1gm po TIDWC, holding since NPO 7. Protein calorie malnutrition- On TNA now 8. S/p renal transplant - at [facility] in '87, started HD in 04/2003, remains on Prednisone.       8.   Hepatitis C/thrombocytopenia    Stephen Moselle  MD [facility] [phone] pgr    [phone] cell 04/24/2012, 10:10 AM     Additional Objective Labs: Basic Metabolic Panel:  Lab 04/24/12 4782 04/23/12 0430 04/22/12 0636  NA 144 147* 140  K 4.9 4.3 5.3*    CL 106 105 96  CO2 22 21 23   GLUCOSE 143* 108* 95  BUN 56* 33* 69*  CREATININE 6.87* 4.84* 9.45*  CALCIUM 8.6 8.4 8.6  ALB -- -- --  PHOS 5.2* 4.3 6.4*   Liver Function Tests:  Lab 04/24/12 0411 04/22/12 0636 04/21/12 0550  AST 25 17 21   ALT 9 10 11   ALKPHOS 55 60 61  BILITOT 1.2 1.0 1.2  PROT 5.4* 5.7* 5.9*  ALBUMIN 1.8* 2.2* 2.4*  CBC:  Lab 04/24/12 0411 04/22/12 0636 04/21/12 0550 04/20/12 0655 04/18/12 0525  WBC 17.9* 9.0 7.3 -- --  NEUTROABS -- -- 5.4 -- --  HGB 7.4* 9.6* 10.2* -- --  HCT 23.0* 29.1* 31.2* -- --  MCV 84.6 85.8 87.2 85.8 86.6  PLT 194 172 165 -- --   Blood Culture No results found for this basename: sdes,  specrequest,  cult,  reptstatus    Medications:    . TPN (CLINIMIX) +/- additives 40 mL/hr at 04/24/12 0800   And  . fat emulsion 250 mL (04/24/12 0800)  . TPN (CLINIMIX) +/- additives    . DISCONTD: sodium chloride Stopped (04/24/12 0800)  . DISCONTD: fat emulsion    . DISCONTD: TPN (CLINIMIX) +/-  additives    . DISCONTD: TPN (CLINIMIX) +/- additives        . antiseptic oral rinse  15 mL Mouth Rinse q12n4p  . darbepoetin (ARANESP) injection - DIALYSIS  200 mcg Intravenous Q Sat-HD  . ferric gluconate (FERRLECIT/NULECIT) IV  125 mg Intravenous Q T,Th,Sa-HD  . fluconazole (DIFLUCAN) IV  400 mg Intravenous Once  . hydrocortisone sodium succinate  50 mg Intravenous Q12H  . HYDROmorphone      . insulin aspart  0-15 Units Subcutaneous Q4H  . lip balm   Topical BID  . micafungin [facility]) IV  100 mg Intravenous Daily  . pantoprazole (PROTONIX) IV  40 mg Intravenous Q24H  . piperacillin-tazobactam (ZOSYN)  IV  2.25 g Intravenous Q8H  . sodium chloride  500 mL Intravenous Once  . sodium chloride  10-40 mL Intracatheter Q12H  . DISCONTD: chlorhexidine  15 mL Mouth Rinse BID  . DISCONTD: darbepoetin (ARANESP) injection - DIALYSIS  100 mcg Intravenous Q Sat-HD  . DISCONTD: hydrocortisone sodium succinate  50 mg Intravenous Q6H  . DISCONTD:  morphine   Intravenous Q4H

## 2012-04-24 NOTE — Progress Notes (Addendum)
Stephen Mulders, MD contacted via pt rhythm being in junctional tachycardia.  Orders were received.  Salomon Mast, RN

## 2012-04-25 ENCOUNTER — Inpatient Hospital Stay (HOSPITAL_COMMUNITY): Payer: Medicare Other

## 2012-04-25 LAB — GLUCOSE, CAPILLARY
Glucose-Capillary: 125 mg/dL — ABNORMAL HIGH (ref 70–99)
Glucose-Capillary: 147 mg/dL — ABNORMAL HIGH (ref 70–99)
Glucose-Capillary: 132 mg/dL — ABNORMAL HIGH (ref 70–99)
Glucose-Capillary: 138 mg/dL — ABNORMAL HIGH (ref 70–99)
Glucose-Capillary: 145 mg/dL — ABNORMAL HIGH (ref 70–99)
Glucose-Capillary: 135 mg/dL — ABNORMAL HIGH (ref 70–99)
Glucose-Capillary: 135 mg/dL — ABNORMAL HIGH (ref 70–99)

## 2012-04-25 LAB — CBC
HCT: 27.5 % — ABNORMAL LOW (ref 39.0–52.0)
Hemoglobin: 9.6 g/dL — ABNORMAL LOW (ref 13.0–17.0)
MCH: 28.7 pg (ref 26.0–34.0)
MCHC: 34.9 g/dL (ref 30.0–36.0)
MCV: 83 fL (ref 78.0–100.0)
MCV: 82.1 fL (ref 78.0–100.0)
Platelets: 113 10*3/uL — ABNORMAL LOW (ref 150–400)
Platelets: 106 K/uL — ABNORMAL LOW (ref 150–400)
RBC: 3.35 MIL/uL — ABNORMAL LOW (ref 4.22–5.81)
RDW: 13.8 % (ref 11.5–15.5)
RDW: 14.7 % (ref 11.5–15.5)
WBC: 13.2 10*3/uL — ABNORMAL HIGH (ref 4.0–10.5)
WBC: 15.4 K/uL — ABNORMAL HIGH (ref 4.0–10.5)

## 2012-04-25 LAB — CARDIAC PANEL(CRET KIN+CKTOT+MB+TROPI)
CK, MB: 2.5 ng/mL (ref 0.3–4.0)
Relative Index: INVALID (ref 0.0–2.5)
Total CK: 41 U/L (ref 7–232)
Troponin I: 1.29 ng/mL (ref <0.30)
Troponin I: 1.41 ng/mL (ref <0.30)

## 2012-04-25 LAB — BASIC METABOLIC PANEL
CO2: 22 mEq/L (ref 19–32)
Chloride: 98 mEq/L (ref 96–112)
Creatinine, Ser: 4.66 mg/dL — ABNORMAL HIGH (ref 0.50–1.35)
Glucose, Bld: 147 mg/dL — ABNORMAL HIGH (ref 70–99)

## 2012-04-25 MED ORDER — LABETALOL HCL 5 MG/ML IV SOLN
10.0000 mg | Freq: Once | INTRAVENOUS | Status: AC
  Administered 2012-04-25: 10 mg via INTRAVENOUS
  Filled 2012-04-25: qty 4

## 2012-04-25 MED ORDER — HYDROCORTISONE SOD SUCCINATE 100 MG IJ SOLR
50.0000 mg | Freq: Every day | INTRAMUSCULAR | Status: DC
  Administered 2012-04-26 – 2012-04-29 (×3): 50 mg via INTRAVENOUS
  Filled 2012-04-25 (×4): qty 1

## 2012-04-25 MED ORDER — MIDAZOLAM HCL 2 MG/2ML IJ SOLN
0.5000 mg | Freq: Once | INTRAMUSCULAR | Status: AC
  Administered 2012-04-25: 0.5 mg via INTRAVENOUS
  Filled 2012-04-25: qty 2

## 2012-04-25 MED ORDER — CLONIDINE HCL 0.2 MG/24HR TD PTWK
0.2000 mg | MEDICATED_PATCH | TRANSDERMAL | Status: DC
  Administered 2012-04-25: 0.2 mg via TRANSDERMAL
  Filled 2012-04-25: qty 1

## 2012-04-25 MED ORDER — IOHEXOL 350 MG/ML SOLN
100.0000 mL | Freq: Once | INTRAVENOUS | Status: AC | PRN
  Administered 2012-04-25: 100 mL via INTRAVENOUS

## 2012-04-25 MED ORDER — MORPHINE SULFATE 2 MG/ML IJ SOLN
2.0000 mg | INTRAMUSCULAR | Status: DC | PRN
Start: 2012-04-25 — End: 2012-05-03
  Administered 2012-04-25 – 2012-05-03 (×23): 2 mg via INTRAVENOUS
  Filled 2012-04-25 (×22): qty 1

## 2012-04-25 MED ORDER — LABETALOL HCL 5 MG/ML IV SOLN
20.0000 mg | Freq: Once | INTRAVENOUS | Status: AC
  Administered 2012-04-25: 20 mg via INTRAVENOUS
  Filled 2012-04-25: qty 4

## 2012-04-25 MED ORDER — HYDRALAZINE HCL 20 MG/ML IJ SOLN
10.0000 mg | Freq: Four times a day (QID) | INTRAMUSCULAR | Status: DC
  Administered 2012-04-25 – 2012-04-27 (×7): 10 mg via INTRAVENOUS
  Filled 2012-04-25 (×12): qty 0.5

## 2012-04-25 MED ORDER — CLINIMIX/DEXTROSE (5/15) 5 % IV SOLN
INTRAVENOUS | Status: AC
  Administered 2012-04-25: 18:00:00 via INTRAVENOUS
  Filled 2012-04-25: qty 2000

## 2012-04-25 NOTE — Progress Notes (Signed)
[facility]   Asked to see pt again for re-thrombosed thigh AVG.  Temporary dialysis catheter has been placed by nephrology.  No plans to intervene on thigh AVG POD#2 from SBR w/ reanastomosis for leak.  Will have Dr. Edilia Bo check on patient this week.  Leonides Sake, MD [facility] [place] Office: [phone] Pager: [phone]  04/25/2012, 8:55 AM

## 2012-04-25 NOTE — Progress Notes (Signed)
eLink Physician-Brief Progress Note Patient Name: [name] DOB: October 15, 1960 MRN: [id number]  Date of Service  04/25/2012   HPI/Events of Note   Pt describd pleuritic cp and related to burping, trop slight up in setting ESRD, ecg no st changes  eICU Interventions  Repeat in am       Jariyah Hackley J. 04/25/2012, 2:25 AM

## 2012-04-25 NOTE — Progress Notes (Signed)
eLink Physician-Brief Progress Note Patient Name: [name] DOB: 05-03-1961 MRN: [id number]  Date of Service  04/25/2012   HPI/Events of Note   Atypical CP continues  eICU Interventions  ecg repeat now, ct chest assess dissection, pe, echo      Nelda Bucks. 04/25/2012, 3:18 AM

## 2012-04-25 NOTE — Progress Notes (Signed)
At 0045 Pt c/o CP with coughing; pt reports CP is consistent with CP experienced with hiccups and coughing, previously.  V/S and physical assessment completed.  Pt further c/o of inability to sleep and desires pharmacologic intervention.  Dr. Tyson Alias notified of patient CP and request.  Orders initiated and implemented

## 2012-04-25 NOTE — Progress Notes (Signed)
PARENTERAL NUTRITION CONSULT NOTE - FOLLOW UP  Pharmacy Consult for TPN Indication: SBO, s/p LOA/SBR/anastamosis , s/p LOA/SBR/re-anastamosis   Allergies  Allergen Reactions  . Allopurinol     REACTION: decreased platelets  . Aspirin     REACTION: unspecified    Patient Measurements: Height: 5\' 11"  (180.3 cm) Weight: 107 lb 9.4 oz (48.8 kg) IBW/kg (Calculated) : 75.3  Adjusted Body Weight: 50.8  Vital Signs: Temp: 97.7 F (36.5 C) (08/04 0811) Temp src: Axillary (08/04 0811) BP: 160/92 mmHg (08/04 0811) Pulse Rate: 77  (08/04 0811) Intake/Output from previous day: 08/03 0701 - 08/04 0700 In: 1140 [NG/GT:50; IV Piggyback:100; TPN:990] Out: 1058 [Emesis/NG output:150] Intake/Output from this shift:    Labs:  [facility] 04/24/12 2045 04/24/12 0411  WBC 17.3* 17.9*  HGB 7.5* 7.4*  HCT 22.6* 23.0*  PLT 129* 194  APTT -- --  INR -- --     Basename 04/25/12 0615 04/24/12 2045 04/24/12 0411 04/23/12 0430  NA 134* 139 144 --  K 3.7 3.4* 4.9 --  CL 98 102 106 --  CO2 22 25 22  --  GLUCOSE 147* 136* 143* --  BUN 49* 31* 56* --  CREATININE 4.66* 3.85* 6.87* --  LABCREA -- -- -- --  CREAT24HRUR -- -- -- --  CALCIUM 9.1 9.1 8.6 --  MG -- 1.8 2.0 1.9  PHOS -- -- 5.2* 4.3  PROT -- -- 5.4* --  ALBUMIN -- -- 1.8* --  AST -- -- 25 --  ALT -- -- 9 --  ALKPHOS -- -- 55 --  BILITOT -- -- 1.2 --  BILIDIR -- -- -- --  IBILI -- -- -- --  PREALBUMIN -- -- -- --  TRIG -- -- -- --  CHOLHDL -- -- -- --  CHOL -- -- -- --   Estimated Creatinine Clearance: 12.8 ml/min (by C-G formula based on Cr of 4.66).    Basename 04/25/12 0806 04/25/12 0545 04/24/12 2358  GLUCAP 145* 138* 132*    Medical History: Past Medical History  Diagnosis Date  . Dialysis patient   . Prostate cancer     Medications:  Scheduled:     . antiseptic oral rinse  15 mL Mouth Rinse q12n4p  . darbepoetin (ARANESP) injection - DIALYSIS  200 mcg Intravenous Q Sat-HD  . ferric gluconate  (FERRLECIT/NULECIT) IV  125 mg Intravenous Q T,Th,Sa-HD  . hydrocortisone sodium succinate  50 mg Intravenous Q12H  . insulin aspart  0-15 Units Subcutaneous Q4H  . labetalol  10 mg Intravenous Once  . labetalol  20 mg Intravenous Once  . lip balm   Topical BID  . metoprolol  5 mg Intravenous Q6H  . micafungin (MYCAMINE) IV  100 mg Intravenous Daily  . midazolam  0.5 mg Intravenous Once  . pantoprazole (PROTONIX) IV  40 mg Intravenous Q24H  . piperacillin-tazobactam (ZOSYN)  IV  2.25 g Intravenous Q8H  . sodium chloride  10-40 mL Intracatheter Q12H  . DISCONTD: morphine   Intravenous Q4H  . DISCONTD: sodium chloride  500 mL Intravenous Once    Insulin Requirements in the past 24 hours:  10 units Novolog. Currently on Moderate SSI q4h  Current Nutrition:  NPO  Admit:  Admitted 7/13 with abd pain, SBO  GI-  SBO, back to OR 8.1 2nd stool leakage from anastamosis- LOA, SBR & anastamosis.  Has been essentially NPO since admit, with significant wt loss.  Central line placed 8.2 with Triple lumen HD cath.  Significant risk for re-feeding.  Tolerated TPN at 26ml/hr very well. I/O good. Ok to increase tpn rate Endo: Moderate SSI q4, on Solu-Cortef.  Serum gluc 136-147 last 24 h. All cbg within goal this AM, excellent control.  Lytes:  Lytes wnl  Expect re-feeding. Hyponatremia but common while on Clinimix d/t 1/4 ns content.  Renal:  ESRD on T/TH/Sat HD.  Ferrlecit, Aranep-Sat Pulm: extubated; 99% sat on 2L [place] Cards: hypertensive this am 160/92. Pos trop, checking ekg.  lopressor Hepatobil: LFTs wnl on 8/1. Neuro:  Fentanyl prn for severe pain ID:    T 99.4.  Lower abdominal peritonitis- on renally adjusted Zosyn and diflucan Best Practices:  Mouthcare, PPI-IV  Nutritional Goals:  1700-1950 kCal, 75-85 grams of protein per day  Plan:  1.  Increase Clinimix 5/15 to 70 ml/hr, which meets protein goal but is just below kcal goal but is acceptable fit.. 2.  F/u TPN labs 3.  Watch closely  for re-feeding syndrome. 4. Will give MVI and lipids on MWF d/t national backorder.  Verlene Mayer, PharmD, BCPS Pager [phone]

## 2012-04-25 NOTE — Progress Notes (Signed)
Subjective:  Pleuritic CP last night, EKG's normal, trop + 1-1.3 range x 2, CKMB neg. Chest CT mild L basilar atx/consolidation, no PE.  Resting this am, no complaints.   Objective Filed Vitals:   04/25/12 0800 04/25/12 0811 04/25/12 0830 04/25/12 0900  BP: 164/92 160/92 162/93 157/84  Pulse: 78 77 77 75  Temp:  97.7 F (36.5 C)    TempSrc:  Axillary    Resp: 18 19 19 16   Height:      Weight:      SpO2: 100% 100% 100% 99%   Physical Exam General: weak, alert, coughing after extubation Heart: tachy reg Lungs:no wheezes or rales Abdomen: not distended, large dressing in place Extremities: no LE edema Dialysis Access: left thigh graft, do not feel thrill or hear a bruit  Dialysis Orders: Center: [facility] on TTS.  EDW 52.5 kg HD Bath 2K/2Ca Time 3.5 hrs Heparin 5000 U. Access AVG @ left thigh BFR 400 DFR 800 Zemplar 0 mcg IV/HD Epogen 0 Units IV/HD Venofer 50/week; last iPTH 314, but product high and no zemplar   Assessment/Plan:  1. SBO, s/p exp lap/SB resection 7/24 Dr. Luisa Hart, c/b dehiscence with peritonitis, back to OR 8/1 for further SB resection and reanastomosis. On IV abx. Doing well.  2. ESRD - HD TTS at [facility]. Dialysis via temp R groin cath for now. Can prob use tight hep next HD on Tuesday. Watch volume now that he is getting TNA.  3. Clotted L thigh AVG- [facility] aware, they will f/u this week 4. Hypertension/volume - was on no BP meds at home. BP up now that he is getting volume (TNA).  Will start Catapres patch TTS-2 today. Has prn IV meds ordered also.  5. Anemia of CKD - Getting max Aranesp. Fe < 10; on  full course of IV Fe; ferritin 552. Hb 7.0 today, will transfuse one unit today, possibly another tomorrow.   6. Metabolic bone disease - Fosrenol was resumed at 1gm po TIDWC, holding since NPO. Phos 5-6 range.  7. Protein calorie malnutrition- On TNA per pharm, to increase to 70cc/hr today. Getting lipids MWF due to shortage.  8. S/p renal transplant - at [facility] in '87, started HD  in 04/2003, remains on Prednisone.       8.   Hepatitis C/thrombocytopenia    Vinson Moselle  MD [facility] [phone] pgr    [phone] cell 04/25/2012, 9:19 AM     Additional Objective Labs: Basic Metabolic Panel:  Lab 04/25/12 9562 04/24/12 2045 04/24/12 0411 04/23/12 0430 04/22/12 0636  NA 134* 139 144 -- --  K 3.7 3.4* 4.9 -- --  CL 98 102 106 -- --  CO2 22 25 22  -- --  GLUCOSE 147* 136* 143* -- --  BUN 49* 31* 56* -- --  CREATININE 4.66* 3.85* 6.87* -- --  CALCIUM 9.1 9.1 8.6 -- --  ALB -- -- -- -- --  PHOS -- -- 5.2* 4.3 6.4*   Liver Function Tests:  Lab 04/24/12 0411 04/22/12 0636 04/21/12 0550  AST 25 17 21   ALT 9 10 11   ALKPHOS 55 60 61  BILITOT 1.2 1.0 1.2  PROT 5.4* 5.7* 5.9*  ALBUMIN 1.8* 2.2* 2.4*  CBC:  Lab 04/25/12 0845 04/24/12 2045 04/24/12 0411 04/22/12 0636 04/21/12 0550  WBC 13.2* 17.3* 17.9* -- --  NEUTROABS -- -- -- -- 5.4  HGB 7.0* 7.5* 7.4* -- --  HCT 20.5* 22.6* 23.0* -- --  MCV 83.0 83.4 84.6 85.8  87.2  PLT 113* 129* 194 -- --   Blood Culture No results found for this basename: sdes,  specrequest,  cult,  reptstatus    Medications:    . TPN (CLINIMIX) +/- additives 40 mL/hr at 04/24/12 0800   And  . fat emulsion 250 mL (04/24/12 0800)  . TPN (CLINIMIX) +/- additives 60 mL/hr at 04/24/12 1823  . TPN (CLINIMIX) +/- additives    . DISCONTD: TPN (CLINIMIX) +/- additives        . antiseptic oral rinse  15 mL Mouth Rinse q12n4p  . darbepoetin (ARANESP) injection - DIALYSIS  200 mcg Intravenous Q Sat-HD  . ferric gluconate (FERRLECIT/NULECIT) IV  125 mg Intravenous Q T,Th,Sa-HD  . hydrocortisone sodium succinate  50 mg Intravenous Q12H  . insulin aspart  0-15 Units Subcutaneous Q4H  . labetalol  10 mg Intravenous Once  . labetalol  20 mg Intravenous Once  . lip balm   Topical BID  . metoprolol  5 mg Intravenous Q6H  . micafungin (MYCAMINE) IV  100 mg Intravenous Daily  . midazolam  0.5 mg Intravenous Once  .  pantoprazole (PROTONIX) IV  40 mg Intravenous Q24H  . piperacillin-tazobactam (ZOSYN)  IV  2.25 g Intravenous Q8H  . sodium chloride  10-40 mL Intracatheter Q12H  . DISCONTD: sodium chloride  500 mL Intravenous Once

## 2012-04-25 NOTE — Progress Notes (Signed)
ANTIBIOTIC CONSULT NOTE - FOLLOW UP  Pharmacy Consult for zosyn Indication: peritonitis  Allergies  Allergen Reactions  . Allopurinol     REACTION: decreased platelets  . Aspirin     REACTION: unspecified    Patient Measurements: Height: 5\' 11"  (180.3 cm) Weight: 107 lb 9.4 oz (48.8 kg) IBW/kg (Calculated) : 75.3   Vital Signs: Temp: 97.6 F (36.4 C) (08/04 0549) Temp src: Axillary (08/04 0549) BP: 170/92 mmHg (08/04 0549) Pulse Rate: 90  (08/04 0549) Intake/Output from previous day: 08/03 0701 - 08/04 0700 In: 1140 [NG/GT:50; IV Piggyback:100; TPN:990] Out: 1058 [Emesis/NG output:150] Intake/Output from this shift:    Labs:  Basename 04/25/12 0615 04/24/12 2045 04/24/12 0411  WBC -- 17.3* 17.9*  HGB -- 7.5* 7.4*  PLT -- 129* 194  LABCREA -- -- --  CREATININE 4.66* 3.85* 6.87*   Estimated Creatinine Clearance: 12.8 ml/min (by C-G formula based on Cr of 4.66).   Assessment: 51 yo male with suspected peritonitis on zosyn and micafungin. Patient noted on HD and zosyn appropriately dosed. WBC= 17.3 on 8/3, currenly afebrile.  8/1 Zosyn>> 8/2 Fluconazole>>8/3 8/3 micafungin  8/3 blood x2- pending   Plan:  -No zosyn changes needed -Will follow clinical progress  Harland German, Pharm D 04/25/2012 8:11 AM

## 2012-04-25 NOTE — Progress Notes (Signed)
[facility] PROGRESS NOTE  [name] ZOX:[id number] DOB: 03/30/1961 DOA: 04/02/2012 PCP: Trevor Iha, MD  Assessment/Plan: Principal Problem:  *SBO (small bowel obstruction) s/p s/p lysis of adhesions and partial small bowel resection 7/24 Anastomosis dehiscence / SBO s/p lysis of adhesions, small bowel resection, omental patch of anastomosis 8/1 Cont TNA via CVL per surgery.   Ileus following gastrointestinal surgery As above  Suspected peritonitis Cont zosyn and micafungin   CKD (chronic kidney disease) stage V requiring chronic dialysis Dialysis per nephrology   Thrombocytopenia Occurred earlier in the admission but resolved. Has recurred and is likely related to surgery and peritonitis.   Anemia  Due to acute blood loss and due to chronic disease Being transfused by surgery today- f/u post transfusion hemoglobin Cont aranesp and ferric gluconate   HTN (hypertension) BP was noted to by high but patient was not in pain at the time- may be related to steroids as well. Will start tapering. Added Hydralazine to Clonidine patch. Also on metoprolol.    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   He relates one day prior to admission he started having abdominal pain which progressively got worse it was mainly periumbilical nothing made it better or worse had no radiation. But in the middle of the night he took some TUMS and the pain did not improve. After this he started vomiting about 3 or 4 times. So he decided to come here to the emergency room. Here in  the emergency room his pain was relieved by narcotics. A CT scan of the abdomen and pelvis was done that shows small bowel obstruction - underwent lysis of adhesions and partial small bowel resection on 7/24 (iliectomy and partial jejunectomy)  - developed a clot in his L femoral AV graft requiring thrombectomy on 7/25 - 8/2 back to OR for what was found to be a 3cm anastomotic leak with peritonitis.    Consultants:  General surgery  [facility]  Events since admission: 7/13 CT Abdomen with SBO  7/24 Ex-lap, lysis of adhesions, partial small bowel resection x2  7/25 Thrombectomy L femoral AV graft  7/26 Extubated, CVL is out  7/27 Transferred to telemetry under [facility]  8/1 Acute abdomen, dehiscence of anastomosis, SBO, back from OR intubated in ICU on PCCM service   Antibiotics: Zosyn 8/1 >>> Micafungin 8/3 >> Invanz 7/24- periop Fluconazole- 8/2- one dose   HPI/Subjective: Pt is sleepy- resting calmly- no c/o pain.   Objective: Filed Vitals:   04/25/12 0800 04/25/12 0811 04/25/12 0830 04/25/12 0900  BP: 164/92 160/92 162/93 157/84  Pulse: 78 77 77 75  Temp:  97.7 F (36.5 C)    TempSrc:  Axillary    Resp: 18 19 19 16   Height:      Weight:      SpO2: 100% 100% 100% 99%    Intake/Output Summary (Last 24 hours) at 04/25/12 0959 Last data filed at 04/25/12 0900  Gross per 24 hour  Intake   1225 ml  Output    958 ml  Net    267 ml  Exam:   General:  No distress  Cardiovascular: RRR, no murmurs  Respiratory: CTA anteriorly  Abdomen: Midline dressing in place- bs negative, non-distended  Ext: no c/c/e  Data Reviewed: Basic Metabolic Panel:  Lab 04/25/12 1478 04/24/12 2045 04/24/12 0411 04/23/12 0430 04/22/12 0636 04/21/12 0550 04/20/12 0655  NA 134* 139 144 147* 140 -- --  K 3.7 3.4* 4.9 4.3 5.3* -- --  CL 98 102 106 105 96 -- --  CO2 22 25 22 21 23  -- --  GLUCOSE 147* 136* 143* 108* 95 -- --  BUN 49* 31* 56* 33* 69* -- --  CREATININE 4.66*  3.85* 6.87* 4.84* 9.45* -- --  CALCIUM 9.1 9.1 8.6 8.4 8.6 -- --  MG -- 1.8 2.0 1.9 2.2 2.1 --  PHOS -- -- 5.2* 4.3 6.4* 5.0* 4.7*   Liver Function Tests:  Lab 04/24/12 0411 04/22/12 0636 04/21/12 0550 04/20/12 0655  AST 25 17 21  --  ALT 9 10 11  --  ALKPHOS 55 60 61 --  BILITOT 1.2 1.0 1.2 --  PROT 5.4* 5.7* 5.9* --  ALBUMIN 1.8* 2.2* 2.4* 2.3*   No results found for this basename: LIPASE:5,AMYLASE:5 in the last 168 hours No results found for this basename: AMMONIA:5 in the last 168 hours CBC:  Lab 04/25/12 0845 04/24/12 2045 04/24/12 0411 04/22/12 0636 04/21/12 0550  WBC 13.2* 17.3* 17.9* 9.0 7.3  NEUTROABS -- -- -- -- 5.4  HGB 7.0* 7.5* 7.4* 9.6* 10.2*  HCT 20.5* 22.6* 23.0* 29.1* 31.2*  MCV 83.0 83.4 84.6 85.8 87.2  PLT 113* 129* 194 172 165   Cardiac Enzymes:  Lab 04/25/12 0615 04/24/12 2045  CKTOTAL 41 51  CKMB 2.0 1.8  CKMBINDEX -- --  TROPONINI 1.29* 1.18*   BNP (last 3 results) No results found for this basename: PROBNP:3 in the last 8760 hours CBG:  Lab 04/25/12 0806 04/25/12 0545 04/24/12 2358 04/24/12 2002 04/24/12 1811  GLUCAP 145* 138* 132* 147* 125*    No results found for this or any previous visit (from the past 240 hour(s)).   Studies:   Ct Angio Chest Pe W/cm &/or Wo Cm  04/25/2012  *RADIOLOGY REPORT*  Clinical Data: Chest pain and cough.  CT ANGIOGRAPHY CHEST  Technique:  Multidetector CT imaging of the chest using the standard protocol during bolus administration of intravenous contrast. Multiplanar reconstructed images including MIPs were obtained and reviewed to evaluate the vascular anatomy.  Contrast: OMNIPAQUE IOHEXOL 350 MG/ML SOLN  Comparison: None.  Findings: Technically adequate study with good opacification of the central and segmental pulmonary arteries.  No focal filling defects.  No evidence of significant pulmonary embolus.  Normal heart size.  Normal caliber thoracic aorta without dissection. Aortic and coronary artery  calcifications.  Enteric tube with tip below the level of the stomach, not visualized.  Small amount of mucous in the trachea.  Airways generally appear patent.  There is a small left pleural effusion with basilar atelectasis or consolidation on the left.  Vague patchy infiltration in the left upper lung may represent edema or pneumonia.  No pneumothorax. There is pneumoperitoneum noted in the upper abdomen.  This likely results from recent surgery with noted laparotomy on 04/22/2012. Normal alignment of the thoracic spine.  IMPRESSION: No evidence of significant pulmonary embolus or aortic dissection. Small left pleural effusion with basilar atelectasis or consolidation.  Vague patchy infiltration in the left upper lung. Abdominal pneumoperitoneum consistent with recent surgery.  Original Report Authenticated By: Marlon Pel,  M.D.    Scheduled Meds: reviewed Continuous Infusions: reviewed  ________________________________________________________________________  Time spent: 35 min    [facility]  [facility] Pager [phone] If 8PM-8AM, please contact night-coverage at [email or web address], password [facility] 04/25/2012, 9:59 AM  LOS: 23 days

## 2012-04-25 NOTE — Progress Notes (Signed)
[facility]vign[facility] Progress Note 3 Days Post-Op  Subjective: The patient is not in any acute distress, but he does not seem comfortable.  No bowel sounds.  NGT not putting out as expected.  Adjusted it with air in ports.  Objective: Vital signs in last 24 hours: Temp:  [97 F (36.1 C)-98.5 F (36.9 C)] 97.7 F (36.5 C) (08/04 0811) Pulse Rate:  [74-120] 75  (08/04 0900) Resp:  [15-23] 16  (08/04 0900) BP: (142-191)/(73-99) 157/84 mmHg (08/04 0900) SpO2:  [98 %-100 %] 99 % (08/04 0900) Weight:  [48.5 kg (106 lb 14.8 oz)-48.8 kg (107 lb 9.4 oz)] 48.8 kg (107 lb 9.4 oz) (08/03 1737) Last BM Date: 04/22/12  Intake/Output from previous day: 08/03 0701 - 08/04 0700 In: 1200 [NG/GT:50; IV Piggyback:100; TPN:1050] Out: 1058 [Emesis/NG output:150] Intake/Output this shift: Total I/O In: 120 [TPN:120] Out: -   General: No acute distress, but does not seems comfortable.  Lungs: Clear  Abd: Firm, not tense, no bowel sounds..  Wound was uncovered but dessicated and dry.  Does not seem to be well perfused, but the fascia is intact.  Extremities: Has right groin dialysis catheter which will limit his mobilization.  Neuro: Intact  Lab Results:  @LABLAST2 (wbc:2,hgb:2,hct:2,plt:2) BMET  Basename 04/25/12 0615 04/24/12 2045  NA 134* 139  K 3.7 3.4*  CL 98 102  CO2 22 25  GLUCOSE 147* 136*  BUN 49* 31*  CREATININE 4.66* 3.85*  CALCIUM 9.1 9.1   PT/INR No results found for this basename: LABPROT:2,INR:2 in the last 72 hours ABG  Basename 04/23/12 0040  PHART 7.428  HCO3 19.5*    Studies/Results: Ct Angio Chest Pe W/cm &/or Wo Cm  04/25/2012  *RADIOLOGY REPORT*  Clinical Data: Chest pain and cough.  CT ANGIOGRAPHY CHEST  Technique:  Multidetector CT imaging of the chest using the standard protocol during bolus administration of intravenous contrast. Multiplanar reconstructed images including MIPs were obtained and reviewed to evaluate the vascular anatomy.  Contrast: OMNIPAQUE  IOHEXOL 350 MG/ML SOLN  Comparison: None.  Findings: Technically adequate study with good opacification of the central and segmental pulmonary arteries.  No focal filling defects.  No evidence of significant pulmonary embolus.  Normal heart size.  Normal caliber thoracic aorta without dissection. Aortic and coronary artery calcifications.  Enteric tube with tip below the level of the stomach, not visualized.  Small amount of mucous in the trachea.  Airways generally appear patent.  There is a small left pleural effusion with basilar atelectasis or consolidation on the left.  Vague patchy infiltration in the left upper lung may represent edema or pneumonia.  No pneumothorax. There is pneumoperitoneum noted in the upper abdomen.  This likely results from recent surgery with noted laparotomy on 04/22/2012. Normal alignment of the thoracic spine.  IMPRESSION: No evidence of significant pulmonary embolus or aortic dissection. Small left pleural effusion with basilar atelectasis or consolidation.  Vague patchy infiltration in the left upper lung. Abdominal pneumoperitoneum consistent with recent surgery.  Original Report Authenticated By: Marlon Pel, M.D.    Anti-infectives: Anti-infectives     Start     Dose/Rate Route Frequency Ordered Stop   04/24/12 1000   micafungin (MYCAMINE) 100 mg in sodium chloride 0.9 % 100 mL IVPB        100 mg 100 mL/hr over 1 Hours Intravenous Daily 04/24/12 0750     04/23/12 1600   fluconazole (DIFLUCAN) IVPB 400 mg        400 mg 200 mL/hr  over 60 Minutes Intravenous  Once 04/23/12 1428 04/23/12 1800   04/22/12 2200   piperacillin-tazobactam (ZOSYN) IVPB 3.375 g  Status:  Discontinued        3.375 g 100 mL/hr over 30 Minutes Intravenous 3 times per day 04/22/12 2120 04/22/12 2137   04/22/12 2200  piperacillin-tazobactam (ZOSYN) IVPB 2.25 g       2.25 g 100 mL/hr over 30 Minutes Intravenous 3 times per day 04/22/12 2140     04/22/12 2157   gentamicin (GARAMYCIN)  injection  Status:  Discontinued          As needed 04/22/12 2158 04/22/12 2335   04/22/12 2156   clindamycin (CLEOCIN) injection  Status:  Discontinued          As needed 04/22/12 2157 04/22/12 2335   04/22/12 2145  piperacillin-tazobactam (ZOSYN) IVPB 3.375 g       3.375 g 12.5 mL/hr over 240 Minutes Intravenous  Once 04/22/12 2141 04/23/12 0145   04/22/12 2130   gentamicin (GARAMYCIN) 240 mg, clindamycin (CLEOCIN) 900 mg in sodium chloride irrigation 0.9 % 1,000 mL irrigation  Status:  Discontinued         Irrigation  Once 04/22/12 2119 04/23/12 0020   04/14/12 0830   ertapenem (INVANZ) 1 g in sodium chloride 0.9 % 50 mL IVPB        1 g 100 mL/hr over 30 Minutes Intravenous To Surgery 04/14/12 0816 04/14/12 0834   04/13/12 1359   ertapenem (INVANZ) 1 g in sodium chloride 0.9 % 50 mL IVPB  Status:  Discontinued        1 g 100 mL/hr over 30 Minutes Intravenous 60 min pre-op 04/13/12 1359 04/14/12 0816          Assessment/Plan: s/p Procedure(s): EXPLORATORY LAPAROTOMY Keep NGT and negative pressure wound drressing tomorrow.  LOS: 23 days   Marta Lamas. Gae Bon, MD, FACS [phone] [facility] 04/25/2012

## 2012-04-25 NOTE — Progress Notes (Signed)
eLink Physician-Brief Progress Note Patient Name: [name] DOB: May 20, 1961 MRN: [id number]  Date of Service  04/25/2012   HPI/Events of Note   insomina  eICU Interventions  Npo status, unable ambien, add versed 0.5 x 1      [name] J. 04/25/2012, 12:38 AM

## 2012-04-25 NOTE — Progress Notes (Signed)
eLink Physician-Brief Progress Note Patient Name: KADEEN SROKA DOB: 04-21-1961 MRN: [id number]  Date of Service  04/25/2012   HPI/Events of Note   sys 190, had hd yesterday  eICU Interventions  Attempt labeotolol iv  x1      Claus Silvestro J. 04/25/2012, 2:31 AM

## 2012-04-25 NOTE — Progress Notes (Addendum)
Spoke with Dr. Butler Denmark regarding pt blood pressure still being in the 170s/90s despite lopressor, morphine, and hydralazine, as well as pt being in junctional rhythm. Orders were received.  Salomon Mast RN

## 2012-04-25 NOTE — Progress Notes (Signed)
CRITICAL VALUE ALERT  Critical value received:  Troponin 1.18  Date of notification:  04/24/2012  Time of notification:  2200  Critical value read back:yes  Nurse who received alert:  Genene Churn  MD notified (1st page):  Albon  Time of first page:  2201  MD notified (2nd page):  Time of second page:  Responding MD:  Frederico Hamman  Time MD responded:  2210

## 2012-04-26 ENCOUNTER — Inpatient Hospital Stay (HOSPITAL_COMMUNITY): Payer: Medicare Other

## 2012-04-26 LAB — CHOLESTEROL, TOTAL: Cholesterol: 83 mg/dL (ref 0–200)

## 2012-04-26 LAB — TYPE AND SCREEN
DAT, IgG: NEGATIVE
PT AG Type: NEGATIVE
Unit division: 0

## 2012-04-26 LAB — MAGNESIUM: Magnesium: 1.9 mg/dL (ref 1.5–2.5)

## 2012-04-26 LAB — CARDIAC PANEL(CRET KIN+CKTOT+MB+TROPI)
CK, MB: 2.5 ng/mL (ref 0.3–4.0)
Relative Index: INVALID (ref 0.0–2.5)
Total CK: 23 U/L (ref 7–232)
Troponin I: 1.86 ng/mL (ref <0.30)

## 2012-04-26 LAB — COMPREHENSIVE METABOLIC PANEL
Alkaline Phosphatase: 60 U/L (ref 39–117)
BUN: 85 mg/dL — ABNORMAL HIGH (ref 6–23)
Creatinine, Ser: 6.17 mg/dL — ABNORMAL HIGH (ref 0.50–1.35)
GFR calc Af Amer: 11 mL/min — ABNORMAL LOW (ref >90)
Glucose, Bld: 166 mg/dL — ABNORMAL HIGH (ref 70–99)
Potassium: 3.8 mEq/L (ref 3.5–5.1)
Total Bilirubin: 1.4 mg/dL — ABNORMAL HIGH (ref 0.3–1.2)
Total Protein: 5.7 g/dL — ABNORMAL LOW (ref 6.0–8.3)

## 2012-04-26 LAB — CBC
MCH: 28.3 pg (ref 26.0–34.0)
MCHC: 35.2 g/dL (ref 30.0–36.0)
Platelets: 129 10*3/uL — ABNORMAL LOW (ref 150–400)
RBC: 3.6 MIL/uL — ABNORMAL LOW (ref 4.22–5.81)

## 2012-04-26 LAB — POCT I-STAT 4, (NA,K, GLUC, HGB,HCT)
Glucose, Bld: 134 mg/dL — ABNORMAL HIGH (ref 70–99)
Hemoglobin: 9.2 g/dL — ABNORMAL LOW (ref 13.0–17.0)
Potassium: 4.3 mEq/L (ref 3.5–5.1)
Sodium: 141 mEq/L (ref 135–145)

## 2012-04-26 LAB — DIFFERENTIAL
Basophils Relative: 1 % (ref 0–1)
Eosinophils Absolute: 0 10*3/uL (ref 0.0–0.7)
Neutrophils Relative %: 85 % — ABNORMAL HIGH (ref 43–77)

## 2012-04-26 LAB — TRIGLYCERIDES: Triglycerides: 72 mg/dL (ref <150)

## 2012-04-26 LAB — GLUCOSE, CAPILLARY: Glucose-Capillary: 123 mg/dL — ABNORMAL HIGH (ref 70–99)

## 2012-04-26 LAB — PREALBUMIN: Prealbumin: 7.7 mg/dL — ABNORMAL LOW (ref 17.0–34.0)

## 2012-04-26 MED ORDER — HEPARIN SODIUM (PORCINE) 1000 UNIT/ML DIALYSIS: 20.0000 [IU]/kg | INTRAMUSCULAR | Status: DC | PRN

## 2012-04-26 MED ORDER — METOPROLOL TARTRATE 1 MG/ML IV SOLN
5.0000 mg | Freq: Once | INTRAVENOUS | Status: AC
  Administered 2012-04-26: 5 mg via INTRAVENOUS

## 2012-04-26 MED ORDER — HYDRALAZINE HCL 20 MG/ML IJ SOLN
10.0000 mg | Freq: Once | INTRAMUSCULAR | Status: AC
  Administered 2012-04-26: 10 mg via INTRAVENOUS
  Filled 2012-04-26: qty 0.5

## 2012-04-26 MED ORDER — METOPROLOL TARTRATE 1 MG/ML IV SOLN
INTRAVENOUS | Status: AC
  Filled 2012-04-26: qty 5

## 2012-04-26 MED ORDER — CEFAZOLIN SODIUM 1-5 GM-% IV SOLN: 1.0000 g | INTRAVENOUS | Status: DC

## 2012-04-26 MED ORDER — FAT EMULSION 20 % IV EMUL
250.0000 mL | INTRAVENOUS | Status: AC
  Administered 2012-04-26: 250 mL via INTRAVENOUS
  Filled 2012-04-26: qty 250

## 2012-04-26 MED ORDER — ZINC TRACE METAL 1 MG/ML IV SOLN
INTRAVENOUS | Status: AC
  Administered 2012-04-26: 18:00:00 via INTRAVENOUS
  Filled 2012-04-26: qty 2000

## 2012-04-26 NOTE — Progress Notes (Signed)
PT Cancellation Note  Treatment cancelled today due to medical issues with patient which prohibited therapy.  Patient has a right femoral temporary HD catheter access.  Appears that plan is to go tomorrow for left femoral thrombectomy so that permanent access can be used.  Will follow up over next few days to determine appropriateness of PT.  Thanks.  INGOLD,Alonnah Lampkins 04/26/2012, 9:56 AM  [facility] [phone] (pager)

## 2012-04-26 NOTE — Progress Notes (Signed)
PARENTERAL NUTRITION CONSULT NOTE - FOLLOW UP  Pharmacy Consult for TPN Indication: SBO, s/p LOA/SBR/anastamosis , s/p LOA/SBR/re-anastamosis with patch repair for leak  Allergies  Allergen Reactions  . Allopurinol     REACTION: decreased platelets  . Aspirin     REACTION: unspecified    Patient Measurements: Height: 5\' 11"  (180.3 cm) Weight: 116 lb 13.5 oz (53 kg) IBW/kg (Calculated) : 75.3  Adjusted Body Weight: 50.8  Vital Signs: Temp: 97.8 F (36.6 C) (08/05 0418) Temp src: Oral (08/05 0418) BP: 189/98 mmHg (08/05 0418) Pulse Rate: 79  (08/05 0418) Intake/Output from previous day: 08/04 0701 - 08/05 0700 In: 2975 [I.V.:750; Blood:625; IV Piggyback:160; TPN:1440] Out: 180 [Emesis/NG output:180] Intake/Output from this shift:    Labs:  [facility] 04/26/12 0711 04/25/12 2058 04/25/12 0845  WBC 14.5* 15.4* 13.2*  HGB 10.2* 9.6* 7.0*  HCT 29.0* 27.5* 20.5*  PLT 129* 106* 113*  APTT -- -- --  INR -- -- --     Basename 04/26/12 0711 04/25/12 0615 04/24/12 2045 04/24/12 0411  NA 129* 134* 139 --  K 3.8 3.7 3.4* --  CL 94* 98 102 --  CO2 17* 22 25 --  GLUCOSE 166* 147* 136* --  BUN 85* 49* 31* --  CREATININE 6.17* 4.66* 3.85* --  LABCREA -- -- -- --  CREAT24HRUR -- -- -- --  CALCIUM 9.5 9.1 9.1 --  MG 1.9 -- 1.8 2.0  PHOS 3.0 -- -- 5.2*  PROT 5.7* -- -- 5.4*  ALBUMIN 1.7* -- -- 1.8*  AST 16 -- -- 25  ALT 10 -- -- 9  ALKPHOS 60 -- -- 55  BILITOT 1.4* -- -- 1.2  BILIDIR -- -- -- --  IBILI -- -- -- --  PREALBUMIN -- -- -- --  TRIG 72 -- -- --  CHOLHDL -- -- -- --  CHOL 83 -- -- --   Estimated Creatinine Clearance: 13.9 ml/min (by C-G formula based on Cr of 4.66).    Basename 04/26/12 0422 04/26/12 0019 04/25/12 2010  GLUCAP 169* 154* 135*    Insulin Requirements in the past 24 hours:  12 units Novolog. Currently on Moderate SSI q4h  Current Nutrition:  NPO  Admit:  Admitted 7/13 with abd pain, weight loss, SBO GI-  SBO, back to OR 8/1 for  anastomotic leak- now s/p patch repair.  Has been essentially NPO since admit, therefore at significant risk for re-feeding, but has been tolerating TPN thus far. NGT output ~ same at . Endo: Moderate SSI q4, on Solu-Cortef-weaning to daily now. CBG controlled, with SSI.   Lytes:  Na low, likely d/t overload and Clinimix without supplemented lytes. K, Mg and Phos WNL.  Renal:  ESRD on T/TH/Sat HD.  Ferrlecit, Aranesp-Sat Pulm: extubated; now on RA Cards: hypertensive. Pos trop, on prn anti-HTNs Hepatobil: LFTs, trig and chol WNL.  Neuro:  Morphine prn for severe pain ID: Afeb, WBC elevated. Lower abdominal peritonitis- on Mycamine and  renally adjusted Zosyn Best Practices:  Mouthcare, PPI-IV  Nutritional Goals:  1700-1950 kCal, 75-85 grams of protein per day  Plan:  1.  Continue Clinimix 5/15 to 70 ml/hr, which meets protein goal but is just below kcal. 2. Will give MVI and lipids (@10cc /hr) on MWF only d/t national backorder. 3. F/up Prealbumin and BMET in AM  Sterling Mondo K. Allena Katz, PharmD, BCPS.  Clinical Pharmacist Pager [phone]. 04/26/2012 8:58 AM

## 2012-04-26 NOTE — Progress Notes (Signed)
General Surgery Note  LOS: 24 days  POD# 12 and 4 Room - 2602  Assessment/Plan: 1.  EXPLORATORY LAPAROTOMY - LOA and SBR x 2 - T. Cornett - 04/14/2012   Re-explore for leak - S. Gross - 04/22/2012  Open wound - necrotic edges.  Dressing changes TID.  I do not think it is ready for VAC.  On Zosyn.  Has bowel sounds, but would keep NGT for now.  2. CKD (chronic kidney disease) stage V requiring chronic dialysis  Dialysis - T/T/Sat   3. Thrombocytopenia  Plt - 129,000 - 04/26/2012 4. Gout  5.  HTN (hypertension)  6.  Chronic use of steroids  7.  Malnutrition, severe - Alb - 1.7 - 04/26/2012, Prealb - 9.5 - 04/21/2012  On TPN 8.  Leg graft occlusion   Subjective:  Sore throat from NGT.  Hurts to talk.  No BM.  Did not get OOB yesterday.  No specific complaint. Objective:   Filed Vitals:   04/26/12 0418  BP: 189/98  Pulse: 79  Temp: 97.8 F (36.6 C)  Resp: 16     Intake/Output from previous day:  08/04 0701 - 08/05 0700 In: 2975 [I.V.:750; Blood:625; IV Piggyback:160; TPN:1440] Out: 180 [Emesis/NG output:180]  Intake/Output this shift:      Physical Exam:   General: Thin AA M who is alert and oriented.    HEENT: Normal. Pupils equal. .   Lungs: Clear   Abdomen: Soft.  Has BS.   Wound: Poor mid line wound healing with some necrotic tissue along edges (this is probably due to nutrition).   Psychiatric: Has normal mood and affect.    Lab Results:    Basename 04/26/12 0711 04/25/12 2058  WBC 14.5* 15.4*  HGB 10.2* 9.6*  HCT 29.0* 27.5*  PLT 129* 106*    BMET   Basename 04/25/12 0615 04/24/12 2045  NA 134* 139  K 3.7 3.4*  CL 98 102  CO2 22 25  GLUCOSE 147* 136*  BUN 49* 31*  CREATININE 4.66* 3.85*  CALCIUM 9.1 9.1    PT/INR  No results found for this basename: LABPROT:2,INR:2 in the last 72 hours  ABG  No results found for this basename: PHART:2,PCO2:2,PO2:2,HCO3:2 in the last 72 hours   Studies/Results:  Ct Angio Chest Pe W/cm &/or Wo Cm  04/25/2012   *RADIOLOGY REPORT*  Clinical Data: Chest pain and cough.  CT ANGIOGRAPHY CHEST  Technique:  Multidetector CT imaging of the chest using the standard protocol during bolus administration of intravenous contrast. Multiplanar reconstructed images including MIPs were obtained and reviewed to evaluate the vascular anatomy.  Contrast: OMNIPAQUE IOHEXOL 350 MG/ML SOLN  Comparison: None.  Findings: Technically adequate study with good opacification of the central and segmental pulmonary arteries.  No focal filling defects.  No evidence of significant pulmonary embolus.  Normal heart size.  Normal caliber thoracic aorta without dissection. Aortic and coronary artery calcifications.  Enteric tube with tip below the level of the stomach, not visualized.  Small amount of mucous in the trachea.  Airways generally appear patent.  There is a small left pleural effusion with basilar atelectasis or consolidation on the left.  Vague patchy infiltration in the left upper lung may represent edema or pneumonia.  No pneumothorax. There is pneumoperitoneum noted in the upper abdomen.  This likely results from recent surgery with noted laparotomy on 04/22/2012. Normal alignment of the thoracic spine.  IMPRESSION: No evidence of significant pulmonary embolus or aortic dissection. Small left pleural  effusion with basilar atelectasis or consolidation.  Vague patchy infiltration in the left upper lung. Abdominal pneumoperitoneum consistent with recent surgery.  Original Report Authenticated By: Marlon Pel, M.D.     Anti-infectives:   Anti-infectives     Start     Dose/Rate Route Frequency Ordered Stop   04/24/12 1000   micafungin (MYCAMINE) 100 mg in sodium chloride 0.9 % 100 mL IVPB        100 mg 100 mL/hr over 1 Hours Intravenous Daily 04/24/12 0750     04/23/12 1600   fluconazole (DIFLUCAN) IVPB 400 mg        400 mg 200 mL/hr over 60 Minutes Intravenous  Once 04/23/12 1428 04/23/12 1800   04/22/12 2200    piperacillin-tazobactam (ZOSYN) IVPB 3.375 g  Status:  Discontinued        3.375 g 100 mL/hr over 30 Minutes Intravenous 3 times per day 04/22/12 2120 04/22/12 2137   04/22/12 2200  piperacillin-tazobactam (ZOSYN) IVPB 2.25 g       2.25 g 100 mL/hr over 30 Minutes Intravenous 3 times per day 04/22/12 2140     04/22/12 2157   gentamicin (GARAMYCIN) injection  Status:  Discontinued          As needed 04/22/12 2158 04/22/12 2335   04/22/12 2156   clindamycin (CLEOCIN) injection  Status:  Discontinued          As needed 04/22/12 2157 04/22/12 2335   04/22/12 2145  piperacillin-tazobactam (ZOSYN) IVPB 3.375 g       3.375 g 12.5 mL/hr over 240 Minutes Intravenous  Once 04/22/12 2141 04/23/12 0145   04/22/12 2130   gentamicin (GARAMYCIN) 240 mg, clindamycin (CLEOCIN) 900 mg in sodium chloride irrigation 0.9 % 1,000 mL irrigation  Status:  Discontinued         Irrigation  Once 04/22/12 2119 04/23/12 0020   04/14/12 0830   ertapenem (INVANZ) 1 g in sodium chloride 0.9 % 50 mL IVPB        1 g 100 mL/hr over 30 Minutes Intravenous To Surgery 04/14/12 0816 04/14/12 0834   04/13/12 1359   ertapenem (INVANZ) 1 g in sodium chloride 0.9 % 50 mL IVPB  Status:  Discontinued        1 g 100 mL/hr over 30 Minutes Intravenous 60 min pre-op 04/13/12 1359 04/14/12 0816          Ovidio Kin, MD, FACS Pager: [phone],   [facility] Office: [phone] 04/26/2012

## 2012-04-26 NOTE — Progress Notes (Signed)
*  PRELIMINARY RESULTS* Echocardiogram 2D Echocardiogram has been performed.  Jeryl Columbia 04/26/2012, 3:38 PM

## 2012-04-26 NOTE — Progress Notes (Addendum)
[facility] PROGRESS NOTE  [name] YNW:[id number] DOB: 03/03/1961 DOA: 04/02/2012 PCP: Trevor Iha, MD  Brief narrative: 51 y/o male with ESRD and Prostatectomy in 2012. One day prior to admission he started having abdominal pain which progressively got worse - mainly periumbilical nothing made it better or worse had no radiation.  He started vomiting about 3 or 4 times and decided to come to the emergency room.  In the emergency room a CT scan of the abdomen and pelvis was done that showed small bowel obstruction.  - 7/24 underwent lysis of adhesions and partial small bowel resection (iliectomy and partial jejunectomy)  - 7//25 developed a clot in his L femoral AV graft requiring thrombectomy  - 8/2 back to OR for what was found to be a 3cm anastomotic leak with peritonitis.   Assessment/Plan:  SBO (small bowel obstruction) s/p s/p lysis of adhesions and partial small bowel resection 7/24 *Subsequent anastomosis dehiscence / SBO s/p lysis of adhesions, small bowel resection, omental patch of anastomosis 8/2 *Cont TNA via CVL per surgery.  *Still no flatus  Ileus following gastrointestinal surgery *As above - anticipated protracted postoperative course *Surgery continuing bowel rest with NG tube decompression  Suspected peritonitis secondary to anastomotic dehiscence *Cont zosyn and micafungin *Surgery maintaining wound VAC to open abdominal wound  Atypical chest pain *Had severe chest pain Saturday night and CT of the chest revealed no evidence of pulmonary embolism or aortic dissection *He continues with burning chest pain that is likely related to NG tube  CKD (chronic kidney disease) stage V requiring chronic dialysis *Dialysis per nephrology  Thrombocytopenia *Occurred earlier in the admission but resolved.  *Has recurred and is likely related to surgery and peritonitis.   Anemia  *Due to acute blood loss and due to chronic disease *Receive packed red  blood cells on 04/25/2012 as ordered per surgery-hemaglobin has increased from 7 to 10.2 *Cont aranesp and ferric gluconate  HTN (hypertension) *Added Hydralazine to Clonidine patch 04/25/2012.  *Continue metoprolol.   Tachycardia *Occurred today during hemodialysis and nephrologist felt was secondary to anxiety as well as increased blood flow rate-symptoms improved with decreasing blood flow rate during dialysis and the addition of when necessary Lopressor  Chronic use of steroids *Continue tapering but do not discontinue IV steroids until able to tolerate oral steroids *At home on Deltasone 5 mg daily  Malnutrition, calorie *Patient cachectic at presentation  *Now with acute malnutrition related to postoperative ileus and surgical complications *Parenteral nutrition managed by surgery and pharmacy  Leg dialysis graft occlusion *s/p thrombectomy 7/25 - unfortunately re clotted-plans for additional thrombectomy procedure on 04/27/2012 *Currently being dialyzed via groin HD catheter- VVS following  Acute respiratory failure with hypoxia- post op-  *reveals emphysematous changes- stable currently  DVT prophylaxis: SCDs Code Status: full  Disposition Plan: Remain in step down- LTAC evaluation pending  Consultants:  General Surgery  [facility]  Events since admission: 7/13 CT Abdomen with SBO  7/24 Ex-lap, lysis of adhesions, partial small bowel resection x2  7/25 Thrombectomy L femoral AV graft  7/26 Extubated, CVL is out  7/27 Transferred to telemetry under [facility]  8/2 Acute abdomen, dehiscence of anastomosis, SBO, back from OR intubated in ICU on PCCM service  Antibiotics: Zosyn 8/1 >>> Micafungin 8/3 >> Invanz 7/24- periop Fluconazole- 8/2- one dose  HPI/Subjective: He is alert and states he continues to have issues with burning chest pain mid chest down to his epigastric region. When questioned regarding swelling in right forearm  he states this occurred after IV  infiltrated.  Objective: Filed Vitals:   04/26/12 1104 04/26/12 1130 04/26/12 1200 04/26/12 1228  BP: 159/83 149/93 154/97 157/98  Pulse: 99 94 107 106  Temp:    97.1 F (36.2 C)  TempSrc:    Oral  Resp: 19 17 17 17   Height:      Weight:    50 kg (110 lb 3.7 oz)  SpO2: 100% 100% 100% 100%    Intake/Output Summary (Last 24 hours) at 04/26/12 1236 Last data filed at 04/26/12 1228  Gross per 24 hour  Intake 2252.5 ml  Output   1781 ml  Net  471.5 ml    Exam:   General:  Alert male, cachectic-appearing, in no acute distress  Cardiovascular: Accelerated Junctional rhythm versus sinus tachycardia, S1-S2, peripheral pulses palpable, no peripheral edema  Respiratory: CTA anteriorly, nasal cannula oxygen at 3 L per minute  Abdomen: Midline dressing in place- bs negative, non-distended but is tender around the wound, NG tube to low wall suction with green bilious returns  Musculoskeletal: Very thin appearing but extremities are symmetrical except for focal area of swelling on right arm between elbow and wrist which apparently is related to recent IV infiltration; otherwise nontraumatic without cyanosis or clubbing  Neurological: Patient is alert and oriented, able to express frustrations regarding current clinical status and hospitalization, exam nonfocal  Data Reviewed: Basic Metabolic Panel:  Lab 04/26/12 1610 04/25/12 0615 04/24/12 2045 04/24/12 0411 04/23/12 0430 04/22/12 0636 04/21/12 0550  NA 129* 134* 139 144 147* -- --  K 3.8 3.7 3.4* 4.9 4.3 -- --  CL 94* 98 102 106 105 -- --  CO2 17* 22 25 22 21  -- --  GLUCOSE 166* 147* 136* 143* 108* -- --  BUN 85* 49* 31* 56* 33* -- --  CREATININE 6.17* 4.66* 3.85* 6.87* 4.84* -- --  CALCIUM 9.5 9.1 9.1 8.6 8.4 -- --  MG 1.9 -- 1.8 2.0 1.9 2.2 --  PHOS 3.0 -- -- 5.2* 4.3 6.4* 5.0*   Liver Function Tests:  Lab 04/26/12 0711 04/24/12 0411 04/22/12 0636 04/21/12 0550 04/20/12 0655  AST 16 25 17 21  --  ALT 10 9 10 11  --    ALKPHOS 60 55 60 61 --  BILITOT 1.4* 1.2 1.0 1.2 --  PROT 5.7* 5.4* 5.7* 5.9* --  ALBUMIN 1.7* 1.8* 2.2* 2.4* 2.3*   CBC:  Lab 04/26/12 0711 04/25/12 2058 04/25/12 0845 04/24/12 2045 04/24/12 0411 04/21/12 0550  WBC 14.5* 15.4* 13.2* 17.3* 17.9* --  NEUTROABS 12.3* -- -- -- -- 5.4  HGB 10.2* 9.6* 7.0* 7.5* 7.4* --  HCT 29.0* 27.5* 20.5* 22.6* 23.0* --  MCV 80.6 82.1 83.0 83.4 84.6 --  PLT 129* 106* 113* 129* 194 --   Cardiac Enzymes:  Lab 04/26/12 1100 04/26/12 0711 04/25/12 1820 04/25/12 1035 04/25/12 0615  CKTOTAL 27 21 29  34 41  CKMB 3.7 2.5 2.5 2.3 2.0  CKMBINDEX -- -- -- -- --  TROPONINI 1.75* 1.34* 1.41* 1.26* 1.29*   CBG:  Lab 04/26/12 0759 04/26/12 0422 04/26/12 0019 04/25/12 2010 04/25/12 1609  GLUCAP 157* 169* 154* 135* 123*    Recent Results (from the past 240 hour(s))  CULTURE, BLOOD (ROUTINE X 2)     Status: Normal (Preliminary result)   Collection Time   04/24/12 11:35 PM      Component Value Range Status Comment   Specimen Description BLOOD LEFT ARM   Final    Special Requests BOTTLES  DRAWN AEROBIC ONLY 5CC   Final    Culture  Setup Time 04/25/2012 11:02   Final    Culture     Final    Value:        BLOOD CULTURE RECEIVED NO GROWTH TO DATE CULTURE WILL BE HELD FOR 5 DAYS BEFORE ISSUING A FINAL NEGATIVE REPORT   Report Status PENDING   Incomplete   CULTURE, BLOOD (ROUTINE X 2)     Status: Normal (Preliminary result)   Collection Time   04/24/12 11:40 PM      Component Value Range Status Comment   Specimen Description BLOOD LEFT HAND   Final    Special Requests BOTTLES DRAWN AEROBIC ONLY 5CC   Final    Culture  Setup Time 04/25/2012 11:02   Final    Culture     Final    Value:        BLOOD CULTURE RECEIVED NO GROWTH TO DATE CULTURE WILL BE HELD FOR 5 DAYS BEFORE ISSUING A FINAL NEGATIVE REPORT   Report Status PENDING   Incomplete      Studies: Ct Angio Chest Pe W/cm &/or Wo Cm  04/25/2012  *RADIOLOGY REPORT*  Clinical Data: Chest pain and cough.  CT  ANGIOGRAPHY CHEST  Technique:  Multidetector CT imaging of the chest using the standard protocol during bolus administration of intravenous contrast. Multiplanar reconstructed images including MIPs were obtained and reviewed to evaluate the vascular anatomy.  Contrast: OMNIPAQUE IOHEXOL 350 MG/ML SOLN  Comparison: None.  Findings: Technically adequate study with good opacification of the central and segmental pulmonary arteries.  No focal filling defects.  No evidence of significant pulmonary embolus.  Normal heart size.  Normal caliber thoracic aorta without dissection. Aortic and coronary artery calcifications.  Enteric tube with tip below the level of the stomach, not visualized.  Small amount of mucous in the trachea.  Airways generally appear patent.  There is a small left pleural effusion with basilar atelectasis or consolidation on the left.  Vague patchy infiltration in the left upper lung may represent edema or pneumonia.  No pneumothorax. There is pneumoperitoneum noted in the upper abdomen.  This likely results from recent surgery with noted laparotomy on 04/22/2012. Normal alignment of the thoracic spine.  IMPRESSION: No evidence of significant pulmonary embolus or aortic dissection. Small left pleural effusion with basilar atelectasis or consolidation.  Vague patchy infiltration in the left upper lung. Abdominal pneumoperitoneum consistent with recent surgery.  Original Report Authenticated By: Marlon Pel, M.D.   Scheduled Meds: reviewed Continuous Infusions: reviewed _________________________________________________________________  Russella Dar ANP [facility] Pager [phone] If 8PM-8AM, please contact night-coverage at [email or web address], password [facility] 04/26/2012, 12:36 PM  LOS: 24 days    I have personally examined this patient and reviewed the entire database. I have reviewed the above note, made any necessary editorial changes, and agree with its content.  Lonia Blood, MD [facility]

## 2012-04-26 NOTE — Progress Notes (Signed)
Nutrition Follow-up  Intervention:   1. Recommend adjusting TPN to meet at least 90% of kcal needs if able given pre-mixed clinimix solution. 2. RD to continue to follow  Assessment:   Pt continues on bowel rest with NG tube for suctioning. Pt increased to Clinimix 5/15 at 70 ml/hr yesterday.  Patient is receiving TPN with Clinimix 5/15 @ 70 ml/hr.  Lipids (20% IVFE @ 10 ml/hr), multivitamins, and trace elements are provided 3 times weekly (MWF) due to national backorder.  Provides 1400 kcal and 84 grams protein daily (based on weekly average).  Meets 82% minimum estimated kcal and 100% minimum estimated protein needs.  Diet Order:  NPO  Meds: Scheduled Meds:    . antiseptic oral rinse  15 mL Mouth Rinse q12n4p  . cloNIDine  0.2 mg Transdermal Weekly  . darbepoetin (ARANESP) injection - DIALYSIS  200 mcg Intravenous Q Sat-HD  . ferric gluconate (FERRLECIT/NULECIT) IV  125 mg Intravenous Q T,Th,Sa-HD  . hydrALAZINE  10 mg Intravenous Q6H  . hydrALAZINE  10 mg Intravenous Once  . hydrocortisone sodium succinate  50 mg Intravenous Daily  . insulin aspart  0-15 Units Subcutaneous Q4H  . lip balm   Topical BID  . metoprolol      . metoprolol  5 mg Intravenous Q6H  . metoprolol  5 mg Intravenous Once  . metoprolol  5 mg Intravenous Once  . micafungin [facility]) IV  100 mg Intravenous Daily  . pantoprazole (PROTONIX) IV  40 mg Intravenous Q24H  . piperacillin-tazobactam (ZOSYN)  IV  2.25 g Intravenous Q8H  . sodium chloride  10-40 mL Intracatheter Q12H  . DISCONTD:  ceFAZolin (ANCEF) IV  1 g Intravenous On Call  . DISCONTD: hydrocortisone sodium succinate  50 mg Intravenous Q12H   Continuous Infusions:    . fat emulsion    . TPN (CLINIMIX) +/- additives 60 mL/hr at 04/24/12 1823  . TPN (CLINIMIX) +/- additives 70 mL/hr at 04/25/12 1733  . TPN (CLINIMIX) +/- additives     PRN Meds:.acetaminophen, acetaminophen, albuterol, heparin, heparin, heparin, morphine injection, ondansetron  (ZOFRAN) IV, ondansetron, promethazine, sodium chloride  Labs:  CMP     Component Value Date/Time   NA 129* 04/26/2012 0711   K 3.8 04/26/2012 0711   CL 94* 04/26/2012 0711   CO2 17* 04/26/2012 0711   GLUCOSE 166* 04/26/2012 0711   BUN 85* 04/26/2012 0711   CREATININE 6.17* 04/26/2012 0711   CALCIUM 9.5 04/26/2012 0711   PROT 5.7* 04/26/2012 0711   ALBUMIN 1.7* 04/26/2012 0711   AST 16 04/26/2012 0711   ALT 10 04/26/2012 0711   ALKPHOS 60 04/26/2012 0711   BILITOT 1.4* 04/26/2012 0711   GFRNONAA 9* 04/26/2012 0711   GFRAA 11* 04/26/2012 0711   Potassium  Date/Time Value Range Status  04/26/2012  7:11 AM 3.8  3.5 - 5.1 mEq/L Final  04/25/2012  6:15 AM 3.7  3.5 - 5.1 mEq/L Final  04/24/2012  8:45 PM 3.4* 3.5 - 5.1 mEq/L Final   Phosphorus  Date/Time Value Range Status  04/26/2012  7:11 AM 3.0  2.3 - 4.6 mg/dL Final  4/0/1027  2:53 AM 5.2* 2.3 - 4.6 mg/dL Final  02/25/4402  4:74 AM 4.3  2.3 - 4.6 mg/dL Final   Magnesium  Date/Time Value Range Status  04/26/2012  7:11 AM 1.9  1.5 - 2.5 mg/dL Final  10/27/9561  8:75 PM 1.8  1.5 - 2.5 mg/dL Final  02/24/3328  5:18 AM 2.0  1.5 - 2.5 mg/dL  Final   Prealbumin  Date/Time Value Range Status  04/26/2012  7:11 AM 7.7* 17.0 - 34.0 mg/dL Final  1/61/0960  4:54 AM 9.5* 17.0 - 34.0 mg/dL Final  0/98/1191  4:78 AM 13.8* 17.0 - 34.0 mg/dL Final    Intake/Output Summary (Last 24 hours) at 04/26/12 1641 Last data filed at 04/26/12 1600  Gross per 24 hour  Intake   1640 ml  Output   1781 ml  Net   -141 ml    Weight Status:  50 kg s/p HD on 8/5 - trending up 47.8 kg s/p HD on 7/30  48.3 kg s/p HD on 7/27  Body mass index is 15.37 kg/(m^2). Pt is underweight.  Re-estimated needs [calculated using the Kidney Disease Outcomes Quality Initiative (KDOQI) Adjustment Equation for the Underweight Patient]:  1700 - 1950 kcal, 80 - 100 grams protein daily  Nutrition Dx:  Inadequate oral intake now R/T GI distress AEB pt report. Ongoing.  Goal:  Initiation of TPN; intake to meet at  least 90% of estimated needs. Met.  Monitor:  TPN use, weights, labs, diet advancement  Jarold Motto MS, RD, LDN Pager: [phone] After-hours pager: [phone]

## 2012-04-26 NOTE — Progress Notes (Signed)
Subjective:  BP has been consistently high overnight. Still with abdominal discomfort, pressure.  VVS in, to declot AVG tomorrow  Objective Filed Vitals:   04/25/12 1804 04/25/12 2007 04/26/12 0000 04/26/12 0418  BP: 173/93 165/95 164/88 189/98  Pulse: 89 79 81 79  Temp: 97.7 F (36.5 C) 97.7 F (36.5 C) 97.7 F (36.5 C) 97.8 F (36.6 C)  TempSrc: Oral Oral Axillary Oral  Resp: 20  13 16   Height:      Weight:    53 kg (116 lb 13.5 oz)  SpO2:  100%  99%   Physical Exam General: weak, alert.  Seems uneasy Heart: tachy reg Lungs:no wheezes or rales Abdomen: not distended, large dressing in place Extremities: no LE edema Dialysis Access: left thigh graft, do not feel thrill or hear a bruit  Dialysis Orders: Center: [facility] on TTS.  EDW 52.5 kg HD Bath 2K/2Ca Time 3.5 hrs Heparin 5000 U. Access AVG @ left thigh BFR 400 DFR 800 Zemplar 0 mcg IV/HD Epogen 0 Units IV/HD Venofer 50/week; last iPTH 314, but product high and no zemplar   Assessment/Plan:  1. SBO, s/p exp lap/SB resection 7/24 Dr. Luisa Hart, c/b dehiscence with peritonitis, back to OR 8/1 for further SB resection and reanastomosis. On IV abx. Doing well. Still with no flatus 2. ESRD - HD TTS at [facility]. Dialysis via temp R groin cath for now. Will plan for 2.5 hours of HD today for volume and to get tuned up for declot tomorrow.  Will get HD after declot tomorrow as well to keep on schedule  3. Clotted L thigh AVG- VVS planning for declot tomorrow 4. Hypertension/volume - was on no BP meds at home. BP up now that he is getting volume (TNA).  Still up with clonidine patch, hydralazine and metoprolol.  Steroids probably not helping, can they be weaned ??? HD today should help 5. Anemia of CKD - Getting max Aranesp. Fe < 10; on  full course of IV Fe; ferritin 552. Hb over 9 after transfusion over weekend 6. Metabolic bone disease - Fosrenol was resumed at 1gm po [facility], holding since NPO. Phos 5-6 range.  7. Protein calorie malnutrition-  On TNA per pharm, to increase to 70cc/hr today. Getting lipids MWF due to shortage.  8. S/p renal transplant - at [facility] in '87, started HD in 04/2003, remains on Prednisone.       9.   Hepatitis C/thrombocytopenia  Drena Ham A     04/26/2012, 7:25 AM     Additional Objective Labs: Basic Metabolic Panel:  Lab 04/25/12 1610 04/24/12 2045 04/24/12 0411 04/23/12 0430 04/22/12 0636  NA 134* 139 144 -- --  K 3.7 3.4* 4.9 -- --  CL 98 102 106 -- --  CO2 22 25 22  -- --  GLUCOSE 147* 136* 143* -- --  BUN 49* 31* 56* -- --  CREATININE 4.66* 3.85* 6.87* -- --  CALCIUM 9.1 9.1 8.6 -- --  ALB -- -- -- -- --  PHOS -- -- 5.2* 4.3 6.4*   Liver Function Tests:  Lab 04/24/12 0411 04/22/12 0636 04/21/12 0550  AST 25 17 21   ALT 9 10 11   ALKPHOS 55 60 61  BILITOT 1.2 1.0 1.2  PROT 5.4* 5.7* 5.9*  ALBUMIN 1.8* 2.2* 2.4*  CBC:  Lab 04/25/12 2058 04/25/12 0845 04/24/12 2045 04/24/12 0411 04/22/12 0636 04/21/12 0550  WBC 15.4* 13.2* 17.3* -- -- --  NEUTROABS -- -- -- -- -- 5.4  HGB 9.6* 7.0* 7.5* -- -- --  HCT 27.5* 20.5* 22.6* -- -- --  MCV 82.1 83.0 83.4 84.6 85.8 --  PLT 106* 113* 129* -- -- --   Blood Culture No results found for this basename: sdes,  specrequest,  cult,  reptstatus    Medications:    . TPN (CLINIMIX) +/- additives 60 mL/hr at 04/24/12 1823  . TPN (CLINIMIX) +/- additives 70 mL/hr at 04/25/12 1733      . antiseptic oral rinse  15 mL Mouth Rinse q12n4p  . cloNIDine  0.2 mg Transdermal Weekly  . darbepoetin (ARANESP) injection - DIALYSIS  200 mcg Intravenous Q Sat-HD  . ferric gluconate (FERRLECIT/NULECIT) IV  125 mg Intravenous Q T,Th,Sa-HD  . hydrALAZINE  10 mg Intravenous Q6H  . hydrALAZINE  10 mg Intravenous Once  . hydrocortisone sodium succinate  50 mg Intravenous Daily  . insulin aspart  0-15 Units Subcutaneous Q4H  . lip balm   Topical BID  . metoprolol  5 mg Intravenous Q6H  . metoprolol  5 mg Intravenous Once  . micafungin [facility]) IV   100 mg Intravenous Daily  . pantoprazole (PROTONIX) IV  40 mg Intravenous Q24H  . piperacillin-tazobactam (ZOSYN)  IV  2.25 g Intravenous Q8H  . sodium chloride  10-40 mL Intracatheter Q12H  . DISCONTD: hydrocortisone sodium succinate  50 mg Intravenous Q12H

## 2012-04-26 NOTE — Progress Notes (Signed)
Notified Mary NP regarding persistent elevated B/P. Orders obtained and initiated will continue to evaluate effectiveness of treatment

## 2012-04-26 NOTE — Consult Note (Signed)
WOC consult Note Reason for Consult: Consult requested for wound vac to abd wound, but this order has been cancelled at this point by Dr Ezzard Standing; wound not ready according to progress notes. Wound type: Full thickness post-op abd midline wound. Measurement:14X3X.8cm Wound bed:90% slough/eschar, 10% red. Drainage (amount, consistency, odor) Small yellow drainage, no odor. Periwound: Intact skin surrounding. Dressing procedure/placement/frequency: CCS following for wound assessment and plan of care.  Pt could benefit from hydrotherapy to assist with removal of nonviable tissue if this is not contraindicated with his type of wound bed. If vac is desired at some point, please re consult WOC nurse for further assistance at that time [facility], RN, MSN, [place]  [phone]

## 2012-04-26 NOTE — Progress Notes (Signed)
Orthopedic Tech Progress Note Patient Details:  [name] 05-Jun-1961 [id number]   trapeze bar   Cammer, Mickie Bail 04/26/2012, 9:09 AM

## 2012-04-26 NOTE — Progress Notes (Signed)
VASCULAR PROGRESS NOTE   PHYSICAL EXAM: Filed Vitals:   04/25/12 2007 04/26/12 0000 04/26/12 0418 04/26/12 0753  BP: 165/95 164/88 189/98 196/102  Pulse: 79 81 79 89  Temp: 97.7 F (36.5 C) 97.7 F (36.5 C) 97.8 F (36.6 C) 97.6 F (36.4 C)  TempSrc: Oral Axillary Oral Axillary  Resp:  13 16 13   Height:      Weight:   116 lb 13.5 oz (53 kg)   SpO2: 100%  99% 98%   Left thigh AV graft is occluded.  LABS: Lab Results  Component Value Date   WBC 14.5* 04/26/2012   HGB 10.2* 04/26/2012   HCT 29.0* 04/26/2012   MCV 80.6 04/26/2012   PLT 129* 04/26/2012    ASSESSMENT/PLAN: Patient likely occluded left femoral AV graft secondary to hypertension. We'll proceed with thrombectomy of left AV graft tomorrow. Procedure and risks discussed with patient.  Waverly Ferrari, MD, FACS Beeper: [phone] 04/26/2012

## 2012-04-26 NOTE — Progress Notes (Signed)
B/p196/94 at 0115. Corrie Dandy, NP notified, orders given and initiated. Will continue to monitor status and eval treatment plan

## 2012-04-26 NOTE — Procedures (Signed)
Patient was seen on dialysis and the procedure was supervised.  BFR 300  Via catheter BP is  160/90.   Patient had tachycardia this AM and this continues along with hypertension and seems uneasy feeling. Will give lopressor and decrease BFR some.  Already feeling better.    [name] A 04/26/2012

## 2012-04-26 NOTE — Progress Notes (Signed)
Spoke with Dr. Kathrene Bongo regarding patient getting out of bed and a hemodialysis access to his right groin.  MD stated that patient needs to stay in bed for now.  They planned to removed hemodialysis access in AM.

## 2012-04-27 ENCOUNTER — Encounter (HOSPITAL_COMMUNITY)
Admission: EM | Disposition: A | Discharge status: Skilled Nursing Facility | Payer: Self-pay | Source: Home / Self Care | Attending: Internal Medicine

## 2012-04-27 ENCOUNTER — Inpatient Hospital Stay (HOSPITAL_COMMUNITY): Payer: Medicare Other | Admitting: Anesthesiology

## 2012-04-27 ENCOUNTER — Encounter (HOSPITAL_COMMUNITY): Payer: Self-pay | Admitting: Certified Registered Nurse Anesthetist

## 2012-04-27 ENCOUNTER — Encounter (HOSPITAL_COMMUNITY): Payer: Self-pay | Admitting: Anesthesiology

## 2012-04-27 ENCOUNTER — Inpatient Hospital Stay (HOSPITAL_COMMUNITY): Payer: Medicare Other

## 2012-04-27 DIAGNOSIS — T82898A Other specified complication of vascular prosthetic devices, implants and grafts, initial encounter: Secondary | ICD-10-CM

## 2012-04-27 HISTORY — PX: THROMBECTOMY W/ EMBOLECTOMY: SHX2507

## 2012-04-27 LAB — CARDIAC PANEL(CRET KIN+CKTOT+MB+TROPI)
CK, MB: 4.7 ng/mL — ABNORMAL HIGH (ref 0.3–4.0)
CK, MB: 7.1 ng/mL (ref 0.3–4.0)
Relative Index: INVALID (ref 0.0–2.5)
Relative Index: INVALID (ref 0.0–2.5)
Total CK: 23 U/L (ref 7–232)
Total CK: 24 U/L (ref 7–232)
Total CK: 22 U/L (ref 7–232)
Troponin I: 1.27 ng/mL (ref <0.30)

## 2012-04-27 LAB — GLUCOSE, CAPILLARY
Glucose-Capillary: 137 mg/dL — ABNORMAL HIGH (ref 70–99)
Glucose-Capillary: 152 mg/dL — ABNORMAL HIGH (ref 70–99)

## 2012-04-27 LAB — BASIC METABOLIC PANEL
BUN: 87 mg/dL — ABNORMAL HIGH (ref 6–23)
CO2: 16 mEq/L — ABNORMAL LOW (ref 19–32)
Calcium: 9.5 mg/dL (ref 8.4–10.5)
Chloride: 92 mEq/L — ABNORMAL LOW (ref 96–112)
Creatinine, Ser: 6.16 mg/dL — ABNORMAL HIGH (ref 0.50–1.35)

## 2012-04-27 SURGERY — THROMBECTOMY ARTERIOVENOUS GORE-TEX GRAFT
Anesthesia: Monitor Anesthesia Care | Site: Leg Upper | Laterality: Left | Wound class: Clean

## 2012-04-27 MED ORDER — SODIUM CHLORIDE 0.9 % IV SOLN
INTRAVENOUS | Status: DC | PRN
  Administered 2012-04-27 (×2): via INTRAVENOUS

## 2012-04-27 MED ORDER — THROMBIN 20000 UNITS EX SOLR
CUTANEOUS | Status: AC
  Filled 2012-04-27: qty 20000

## 2012-04-27 MED ORDER — HEPARIN SODIUM (PORCINE) 1000 UNIT/ML IJ SOLN
INTRAMUSCULAR | Status: DC | PRN
  Administered 2012-04-27: 5000 [IU] via INTRAVENOUS
  Administered 2012-04-27: 1000 [IU] via INTRAVENOUS

## 2012-04-27 MED ORDER — HYDRALAZINE HCL 20 MG/ML IJ SOLN
10.0000 mg | Freq: Four times a day (QID) | INTRAMUSCULAR | Status: DC | PRN
  Administered 2012-05-05 – 2012-05-12 (×2): 10 mg via INTRAVENOUS
  Filled 2012-04-27 (×3): qty 0.5

## 2012-04-27 MED ORDER — PROPOFOL 10 MG/ML IV EMUL
INTRAVENOUS | Status: DC | PRN
  Administered 2012-04-27: 110 mg via INTRAVENOUS

## 2012-04-27 MED ORDER — THROMBIN 20000 UNITS EX KIT
PACK | CUTANEOUS | Status: DC | PRN
  Administered 2012-04-27: 10:00:00 via TOPICAL

## 2012-04-27 MED ORDER — PROTAMINE SULFATE 10 MG/ML IV SOLN
INTRAVENOUS | Status: DC | PRN
  Administered 2012-04-27 (×2): 10 mg via INTRAVENOUS

## 2012-04-27 MED ORDER — ONDANSETRON HCL 4 MG/2ML IJ SOLN: 4.0000 mg | Freq: Once | INTRAMUSCULAR | Status: DC | PRN

## 2012-04-27 MED ORDER — PHENYLEPHRINE HCL 10 MG/ML IJ SOLN
INTRAMUSCULAR | Status: DC | PRN
  Administered 2012-04-27: 40 ug via INTRAVENOUS

## 2012-04-27 MED ORDER — INSULIN REGULAR HUMAN 100 UNIT/ML IJ SOLN
INTRAVENOUS | Status: DC
  Administered 2012-04-27: 18:00:00 via INTRAVENOUS
  Filled 2012-04-27: qty 2000

## 2012-04-27 MED ORDER — HYDROMORPHONE HCL PF 1 MG/ML IJ SOLN
INTRAMUSCULAR | Status: AC
  Administered 2012-04-27: 0.25 mg via INTRAVENOUS
  Filled 2012-04-27: qty 1

## 2012-04-27 MED ORDER — HYDROMORPHONE HCL PF 1 MG/ML IJ SOLN
0.2500 mg | INTRAMUSCULAR | Status: DC | PRN
  Administered 2012-04-27: 0.25 mg via INTRAVENOUS

## 2012-04-27 MED ORDER — FENTANYL CITRATE 0.05 MG/ML IJ SOLN
INTRAMUSCULAR | Status: DC | PRN
  Administered 2012-04-27 (×2): 25 ug via INTRAVENOUS

## 2012-04-27 MED ORDER — HEPARIN SODIUM (PORCINE) 1000 UNIT/ML DIALYSIS: 20.0000 [IU]/kg | INTRAMUSCULAR | Status: DC | PRN

## 2012-04-27 MED ORDER — SODIUM CHLORIDE 0.9 % IR SOLN
Status: DC | PRN
  Administered 2012-04-27: 08:00:00

## 2012-04-27 MED ORDER — 0.9 % SODIUM CHLORIDE (POUR BTL) OPTIME
TOPICAL | Status: DC | PRN
  Administered 2012-04-27: 1000 mL

## 2012-04-27 MED ORDER — LABETALOL HCL 5 MG/ML IV SOLN
INTRAVENOUS | Status: DC | PRN
  Administered 2012-04-27: 5 mg via INTRAVENOUS

## 2012-04-27 MED ORDER — LIDOCAINE HCL (CARDIAC) 20 MG/ML IV SOLN
INTRAVENOUS | Status: DC | PRN
  Administered 2012-04-27: 40 mg via INTRAVENOUS

## 2012-04-27 SURGICAL SUPPLY — 51 items
ADH SKN CLS APL DERMABOND .7 (GAUZE/BANDAGES/DRESSINGS) ×2
CANISTER SUCTION 2500CC (MISCELLANEOUS) ×3 IMPLANT
CATH EMB 4FR 80CM (CATHETERS) ×4 IMPLANT
CLIP TI MEDIUM 6 (CLIP) ×3 IMPLANT
CLIP TI WIDE RED SMALL 6 (CLIP) ×3 IMPLANT
CLOTH BEACON ORANGE TIMEOUT ST (SAFETY) ×3 IMPLANT
COVER SURGICAL LIGHT HANDLE (MISCELLANEOUS) ×3 IMPLANT
DERMABOND ADVANCED (GAUZE/BANDAGES/DRESSINGS) ×1
DERMABOND ADVANCED .7 DNX12 (GAUZE/BANDAGES/DRESSINGS) ×2 IMPLANT
DRAPE INCISE IOBAN 66X45 STRL (DRAPES) ×1 IMPLANT
DRAPE ORTHO SPLIT 77X108 STRL (DRAPES) ×3
DRAPE SURG ORHT 6 SPLT 77X108 (DRAPES) IMPLANT
DRAPE X-RAY CASS 24X20 (DRAPES) IMPLANT
DRSG COVADERM 4X6 (GAUZE/BANDAGES/DRESSINGS) ×1 IMPLANT
ELECT REM PT RETURN 9FT ADLT (ELECTROSURGICAL) ×3
ELECTRODE REM PT RTRN 9FT ADLT (ELECTROSURGICAL) ×2 IMPLANT
FELT TEFLON 4 X1 (Mesh General) ×1 IMPLANT
GAUZE SPONGE 4X4 16PLY XRAY LF (GAUZE/BANDAGES/DRESSINGS) IMPLANT
GEL ULTRASOUND 20GR AQUASONIC (MISCELLANEOUS) IMPLANT
GLOVE BIO SURGEON STRL SZ 6.5 (GLOVE) ×2 IMPLANT
GLOVE BIO SURGEON STRL SZ7.5 (GLOVE) ×3 IMPLANT
GLOVE BIOGEL PI IND STRL 6.5 (GLOVE) IMPLANT
GLOVE BIOGEL PI IND STRL 7.0 (GLOVE) IMPLANT
GLOVE BIOGEL PI IND STRL 7.5 (GLOVE) ×2 IMPLANT
GLOVE BIOGEL PI INDICATOR 6.5 (GLOVE) ×4
GLOVE BIOGEL PI INDICATOR 7.0 (GLOVE) ×2
GLOVE BIOGEL PI INDICATOR 7.5 (GLOVE) ×1
GOWN PREVENTION PLUS XLARGE (GOWN DISPOSABLE) ×1 IMPLANT
GOWN STRL NON-REIN LRG LVL3 (GOWN DISPOSABLE) ×8 IMPLANT
GRAFT GORETEX 6X10 (Vascular Products) ×1 IMPLANT
KIT BASIN OR (CUSTOM PROCEDURE TRAY) ×3 IMPLANT
KIT ROOM TURNOVER OR (KITS) ×3 IMPLANT
NS IRRIG 1000ML POUR BTL (IV SOLUTION) ×3 IMPLANT
PACK CV ACCESS (CUSTOM PROCEDURE TRAY) ×3 IMPLANT
PAD ARMBOARD 7.5X6 YLW CONV (MISCELLANEOUS) ×6 IMPLANT
SET COLLECT BLD 21X3/4 12 (NEEDLE) IMPLANT
SPONGE LAP 18X18 X RAY DECT (DISPOSABLE) ×1 IMPLANT
SPONGE SURGIFOAM ABS GEL 100 (HEMOSTASIS) IMPLANT
STAPLER VISISTAT 35W (STAPLE) ×1 IMPLANT
STOPCOCK 4 WAY LG BORE MALE ST (IV SETS) IMPLANT
SUT ETHILON 3 0 PS 1 (SUTURE) ×2 IMPLANT
SUT PROLENE 6 0 BV (SUTURE) ×10 IMPLANT
SUT VIC AB 2-0 CTB1 (SUTURE) ×1 IMPLANT
SUT VIC AB 3-0 SH 27 (SUTURE) ×3
SUT VIC AB 3-0 SH 27X BRD (SUTURE) ×2 IMPLANT
SUT VICRYL 4-0 PS2 18IN ABS (SUTURE) ×3 IMPLANT
TOWEL OR 17X24 6PK STRL BLUE (TOWEL DISPOSABLE) ×3 IMPLANT
TOWEL OR 17X26 10 PK STRL BLUE (TOWEL DISPOSABLE) ×3 IMPLANT
TUBING EXTENTION W/L.L. (IV SETS) IMPLANT
UNDERPAD 30X30 INCONTINENT (UNDERPADS AND DIAPERS) ×3 IMPLANT
WATER STERILE IRR 1000ML POUR (IV SOLUTION) ×3 IMPLANT

## 2012-04-27 NOTE — H&P (View-Only) (Signed)
VASCULAR PROGRESS NOTE   PHYSICAL EXAM: Filed Vitals:   04/25/12 2007 04/26/12 0000 04/26/12 0418 04/26/12 0753  BP: 165/95 164/88 189/98 196/102  Pulse: 79 81 79 89  Temp: 97.7 F (36.5 C) 97.7 F (36.5 C) 97.8 F (36.6 C) 97.6 F (36.4 C)  TempSrc: Oral Axillary Oral Axillary  Resp:  13 16 13  Height:      Weight:   116 lb 13.5 oz (53 kg)   SpO2: 100%  99% 98%   Left thigh AV graft is occluded.  LABS: Lab Results  Component Value Date   WBC 14.5* 04/26/2012   HGB 10.2* 04/26/2012   HCT 29.0* 04/26/2012   MCV 80.6 04/26/2012   PLT 129* 04/26/2012    ASSESSMENT/PLAN: Patient likely occluded left femoral AV graft secondary to hypertension. We'll proceed with thrombectomy of left AV graft tomorrow. Procedure and risks discussed with patient.  Christopher Dickson, MD, FACS Beeper: [phone] 04/26/2012    

## 2012-04-27 NOTE — Transfer of Care (Signed)
Immediate Anesthesia Transfer of Care Note  Patient: [name]  Procedure(s) Performed: Procedure(s) (LRB): THROMBECTOMY ARTERIOVENOUS GORE-TEX GRAFT (Left) REVISION OF ARTERIOVENOUS GORETEX GRAFT (Left)  Patient Location: PACU  Anesthesia Type: General  Level of Consciousness: awake, alert , oriented and patient cooperative  Airway & Oxygen Therapy: Patient Spontanous Breathing and Patient connected to face mask oxygen  Post-op Assessment: Report given to PACU RN, Post -op Vital signs reviewed and stable and Patient moving all extremities  Post vital signs: Reviewed and stable  Complications: No apparent anesthesia complications

## 2012-04-27 NOTE — Progress Notes (Signed)
Report given to elise rn as caregiver 

## 2012-04-27 NOTE — Progress Notes (Signed)
Patient wants to sign off of HD, minimal fluid removal due to BP. Having pain as well.    Will need to do daily for a while.   Had wanted to take vas cath out but they are using for TPN.  I think this is a bad option as will get infected and that is the last thing he needs.   Afra Tricarico A

## 2012-04-27 NOTE — Progress Notes (Signed)
[facility] Progress Note  Subjective:  Ready for dialysis.  Looks OK today.  S/p declot using thigh AVG at present  Objective Filed Vitals:   04/27/12 1145 04/27/12 1200 04/27/12 1215 04/27/12 1300  BP: 129/73 128/68 137/69 117/74  Pulse: 89 90 89   Temp:  97.4 F (36.3 C)  97.2 F (36.2 C)  TempSrc:    Axillary  Resp: 15 16 15 16   Height:      Weight:      SpO2: 99% 99% 100%    Physical Exam General: tired, but alert Heart: RRR Lungs: mostly clear Abdomen: distended, slightly painful Extremities: no peripheral edema Dialysis Access: left thigh AVGG patent; dressing intact with scant bleeding.  Dialysis Orders: Center: [facility] on TTS.  EDW 52.5 kg HD Bath 2K/2Ca Time 3.5 hrs Heparin 5000 U. Access AVG @ left thigh BFR 400 DFR 800 Zemplar 0 mcg IV/HD Epogen 0 Units IV/HD Venofer 50/week; last iPTH 314, but product high and no zemplar  Assessment/Plan:  1. SBO, s/p exp lap/SB resection 7/24 Dr. Luisa Hart, c/w dehiscence with peritonitis, back to OR 8/1 for further SB resection and reanastomosis. On IV zosyn and micafungin. NG in place/clamped no drainage recorded,  Doing relatively well from this standpoint 2. ESRD - HD TTS at [facility]. temp R groin cath still in place until we make sure thigh graft is functioning ok.  HD today to keep on schedule  4 K bath. Will write for nursing to take out today after hd 3. Clotted L thigh AVG- s/p T & R; staff advised no heparin today. 4. Hypertension/volume - was on no BP meds at home. BP up now that he is getting volume (TNA). Still up with clonidine patch, hydralazine and metoprolol. Steroids being weaned. HD today should help. Volume is a major issue due to TPN 5. Anemia of CKD - Getting max Aranesp. Fe olume intake increasing with TNA. Weight up 3-4 kg.in 3 days . < 10; on full course of IV Fe; ferritin 552. Hb over 9 after transfusion over weekend 6. Metabolic bone disease - Fosrenol was resumed at 1gm po TIDWC, holding since NPO.  Phos 3 Monday.  7. Protein calorie malnutrition- Pre-albumin very low; on TNA 70cc/hr (1680 = any other fluids) today. Getting lipids MWF due to shortage;  8. S/p renal transplant - at [facility] in '87, started HD in 04/2003, remains on Prednisone.       9. Hepatitis C/thrombocytopenia - stable   Sheffield Slider, PA-C [facility] Beeper [phone]  04/27/2012,2:14 PM  LOS: 25 days   Patient seen and examined, agree with above note with above modifications. Seen on HD, plan for removal of temp HD cath tonight and attempt to hold on HD until Thursday.  Annie Sable, MD 04/27/2012      Additional Objective Labs: Basic Metabolic Panel:  Lab 04/27/12 9562 04/26/12 1308 04/25/12 0615 04/24/12 0411 04/23/12 0430  NA 126* 129* 134* -- --  K 3.7 3.8 3.7 -- --  CL 92* 94* 98 -- --  CO2 16* 17* 22 -- --  GLUCOSE 179* 166* 147* -- --  BUN 87* 85* 49* -- --  CREATININE 6.16* 6.17* 4.66* -- --  CALCIUM 9.5 9.5 9.1 -- --  ALB -- -- -- -- --  PHOS -- 3.0 -- 5.2* 4.3   Liver Function Tests:  Lab 04/26/12 0711 04/24/12 0411 04/22/12 0636  AST 16 25 17   ALT 10 9 10   ALKPHOS 60 55 60  BILITOT 1.4* 1.2 1.0  PROT 5.7* 5.4* 5.7*  ALBUMIN 1.7* 1.8* 2.2*   CBC:  Lab 04/26/12 0711 04/25/12 2058 04/25/12 0845 04/24/12 2045 04/24/12 0411 04/21/12 0550  WBC 14.5* 15.4* 13.2* -- -- --  NEUTROABS 12.3* -- -- -- -- 5.4  HGB 10.2* 9.6* 7.0* -- -- --  HCT 29.0* 27.5* 20.5* -- -- --  MCV 80.6 82.1 83.0 83.4 84.6 --  PLT 129* 106* 113* -- -- --  Blood Culture    Component Value Date/Time   SDES BLOOD LEFT HAND 04/24/2012 2340   SPECREQUEST BOTTLES DRAWN AEROBIC ONLY 5CC 04/24/2012 2340   CULT        BLOOD CULTURE RECEIVED NO GROWTH TO DATE CULTURE WILL BE HELD FOR 5 DAYS BEFORE ISSUING A FINAL NEGATIVE REPORT 04/24/2012 2340   REPTSTATUS PENDING 04/24/2012 2340  Cardiac Enzymes:  Lab 04/27/12 1300 04/27/12 0525 04/26/12 2105 04/26/12 1100 04/26/12 0711  CKTOTAL 24 23 23 27 21   CKMB  7.1* 4.7* 4.0 3.7 2.5  CKMBINDEX -- -- -- -- --  TROPONINI 2.32* 1.27* 1.86* 1.75* 1.34*  CBG:  Lab 04/27/12 1310 04/27/12 1040 04/27/12 0533 04/27/12 0015 04/26/12 2050  GLUCAP 158* 184* 171* 137* 168*  Medications:    . fat emulsion Stopped (04/27/12 0624)  . TPN (CLINIMIX) +/- additives 70 mL/hr at 04/25/12 1733  . TPN [facility]) +/- additives Stopped (04/27/12 1610)  . TPN (CLINIMIX) +/- additives        . antiseptic oral rinse  15 mL Mouth Rinse q12n4p  . cloNIDine  0.2 mg Transdermal Weekly  . darbepoetin (ARANESP) injection - DIALYSIS  200 mcg Intravenous Q Sat-HD  . ferric gluconate (FERRLECIT/NULECIT) IV  125 mg Intravenous Q T,Th,Sa-HD  . hydrALAZINE  10 mg Intravenous Q6H  . hydrocortisone sodium succinate  50 mg Intravenous Daily  . insulin aspart  0-15 Units Subcutaneous Q4H  . lip balm   Topical BID  . metoprolol      . metoprolol  5 mg Intravenous Q6H  . micafungin (MYCAMINE) IV  100 mg Intravenous Daily  . pantoprazole (PROTONIX) IV  40 mg Intravenous Q24H  . piperacillin-tazobactam (ZOSYN)  IV  2.25 g Intravenous Q8H  . sodium chloride  10-40 mL Intracatheter Q12H

## 2012-04-27 NOTE — Interval H&P Note (Signed)
History and Physical Interval Note:  04/27/2012 7:22 AM  [name]  has presented today for surgery, with the diagnosis of End Stage Renal Disease Complication of Arteriovenous Gortex Graft  The various methods of treatment have been discussed with the patient and family. After consideration of risks, benefits and other options for treatment, the patient has consented to: THROMBECTOMY LEFT THIGH ARTERIOVENOUS GORE-TEX GRAFT.  The patient's history has been reviewed, patient examined, no change in status, stable for surgery.  I have reviewed the patient's chart and labs.  Questions were answered to the patient's satisfaction.     DICKSON,CHRISTOPHER S

## 2012-04-27 NOTE — Progress Notes (Signed)
Hydrotherapy Evaluation:   04/27/12 1413  Subjective Assessment  Subjective "Whatever we need to do."  Patient and Family Stated Goals Get well.  Date of Onset 04/02/12  Prior Treatments Dressing changes  Evaluation and Treatment  Evaluation and Treatment Procedures Explained to Patient/Family Yes  Evaluation and Treatment Procedures agreed to  Wound 04/27/12 Other (Comment) Abdomen Mid Midline abdominal incisional wound  Date First Assessed/Time First Assessed: 04/27/12 1326   Wound Type: Other (Comment)  Location: Abdomen  Location Orientation: Mid  Wound Description (Comments): Midline abdominal incisional wound  Site / Wound Assessment Black;Yellow;Red  % Wound base Red or Granulating 10%  % Wound base Yellow 50%  % Wound base Black 40%  Peri-wound Assessment Intact  Wound Length (cm) 19.2 cm  Wound Width (cm) 3 cm  Wound Depth (cm) 0.8 cm  Undermining (cm) 0.5 at 3 o'clock distally.  Margins Attached edges (approximated)  Closure None  Drainage Amount Minimal  Drainage Description Serosanguineous;No odor  Non-staged Wound Description Partial thickness  Treatment Hydrotherapy (Pulse lavage);Debridement (Selective);Packing (Saline gauze) (NS W to D 4x4, Dry 4x4, ABD, skin prep, tape)  Dressing Type ABD;Gauze (Comment);Moist to dry (NS W to D 4x4. dry 4x4, abd, skin prep, tape)  Dressing Changed New  Dressing Status Clean  Hydrotherapy  Pulsed Lavage with Suction (psi) 8 psi  Pulsed Lavage with Suction - Normal Saline Used 1000 mL  Pulsed Lavage Tip Tip with splash shield  Pulsed lavage therapy - wound location Abdomen  Selective Debridement  Selective Debridement - Location Abdomen  Selective Debridement - Tools Used Forceps;Scissors  Selective Debridement - Tissue Removed Yellow and black slough.  Wound Therapy - Assess/Plan/Recommendations  Wound Therapy - Clinical Statement Pt is a 51 y/o male with an abdominal midline incisional wound.  Pt would benefit from  hydrotherapy to assist with cleansing and debridement while facilitating wound healing.  Wound Therapy - Functional Problem List Decreased skin integrity causing increased chance for infection.  Factors Delaying/Impairing Wound Healing Infection - systemic/local;Immobility;Multiple medical problems;Polypharmacy  Hydrotherapy Plan Debridement;Dressing change;Patient/family education;Pulsatile lavage with suction  Wound Therapy - Frequency 6X / week  Wound Therapy - Current Recommendations PT;OT;Surgery consult;WOC nurse  Wound Therapy - Follow Up Recommendations Home health RN  Wound Plan Pulse lavage, debridement, dressing change 6x/week.  Wound Therapy Goals - Improve the function of patient's integumentary system by progressing the wound(s) through the phases of wound healing by:  Decrease Necrotic Tissue to 75  Decrease Necrotic Tissue - Progress Goal set today  Increase Granulation Tissue to 25  Increase Granulation Tissue - Progress Goal set today  Improve Drainage Characteristics Serous  Improve Drainage Characteristics - Progress Goal set today  Goals/treatment plan/discharge plan were made with and agreed upon by patient/family Yes  Time For Goal Achievement 7 days  Wound Therapy - Potential for Goals Good    Pain: No reports with treatment.  04/27/2012 Cephus Shelling, PT, DPT [phone]

## 2012-04-27 NOTE — Progress Notes (Signed)
PARENTERAL NUTRITION CONSULT NOTE - FOLLOW UP  Pharmacy Consult for TPN Indication: SBO, s/p LOA/SBR/anastamosis , s/p LOA/SBR/re-anastamosis with patch repair for leak  Allergies  Allergen Reactions  . Allopurinol     REACTION: decreased platelets  . Aspirin     REACTION: unspecified    Patient Measurements: Height: 5\' 11"  (180.3 cm) Weight: 113 lb 1.5 oz (51.3 kg) IBW/kg (Calculated) : 75.3  Adjusted Body Weig ht: 50.8  Vital Signs: Temp: 98.4 F (36.9 C) (08/06 0400) Temp src: Oral (08/06 0400) BP: 156/74 mmHg (08/06 0600) Pulse Rate: 80  (08/06 0600) Intake/Output from previous day: 08/05 0701 - 08/06 0700 In: 1690 [I.V.:10; IV Piggyback:100; TPN:1580] Out: 1951 [Emesis/NG output:350] Intake/Output from this shift: Total I/O In: 300 [I.V.:300] Out: 30 [Blood:30]  Labs:  [facility] 04/26/12 0711 04/25/12 2058 04/25/12 0845  WBC 14.5* 15.4* 13.2*  HGB 10.2* 9.6* 7.0*  HCT 29.0* 27.5* 20.5*  PLT 129* 106* 113*  APTT -- -- --  INR -- -- --     Basename 04/27/12 0525 04/26/12 0711 04/25/12 0615 04/24/12 2045  NA 126* 129* 134* --  K 3.7 3.8 3.7 --  CL 92* 94* 98 --  CO2 16* 17* 22 --  GLUCOSE 179* 166* 147* --  BUN 87* 85* 49* --  CREATININE 6.16* 6.17* 4.66* --  LABCREA -- -- -- --  CREAT24HRUR -- -- -- --  CALCIUM 9.5 9.5 9.1 --  MG -- 1.9 -- 1.8  PHOS -- 3.0 -- --  PROT -- 5.7* -- --  ALBUMIN -- 1.7* -- --  AST -- 16 -- --  ALT -- 10 -- --  ALKPHOS -- 60 -- --  BILITOT -- 1.4* -- --  BILIDIR -- -- -- --  IBILI -- -- -- --  PREALBUMIN -- 7.7* -- --  TRIG -- 72 -- --  CHOLHDL -- -- -- --  CHOL -- 83 -- --   Estimated Creatinine Clearance: 10.2 ml/min (by C-G formula based on Cr of 6.16).    Basename 04/27/12 0533 04/27/12 0015 04/26/12 2050  GLUCAP 171* 137* 168*    Insulin Requirements in the past 24 hours:  10 units Novolog. Currently on Moderate SSI q4h  Current Nutrition:  NPO, Clinimix 5/15 at 70 ml/hr with lipids at 10 ml/hr MWF  only  Admit:  Admitted 7/13 with abd pain, weight loss, SBO. Presently in the OR for repeat thrombectomy of L thigh AV Graft. (s/p thrombectomy 7/25).  GI-  SBO, back to OR 8/1 for anastomotic leak- now s/p patch repair.  Has been essentially NPO since admit, therefore at significant risk for re-feeding, but has been tolerating TPN thus far. NGT output increased to . Endo: Moderate SSI q4, on Solu-Cortef-weaned to daily now. CBG slightly elevated with SSI.   Lytes:  Na low, likely d/t overload and Clinimix without supplemented lytes. K, Mg and Phos WNL.  Renal:  ESRD on T/TH/Sat HD.  Ferrlecit, Aranesp-Sat Pulm: extubated; now on RA Cards: hypertensive. Pos trop, on prn anti-HTNs Hepatobil: LFTs, trig and chol WNL.  Prealbumin decreased - will follow. Neuro:  Morphine prn for severe pain ID: Afeb, WBC elevated. Lower abdominal peritonitis- on Mycamine and  renally adjusted Zosyn Best Practices:  Mouthcare, PPI-IV  Nutritional Goals:  1700-1950 kCal, 75-85 grams of protein per day  Plan:  - Continue Clinimix 5/15 to 70 ml/hr, which meets protein goal but is just below kcal. - Provide MVI and lipids (@10cc /hr) on MWF only d/t Acupuncturist. - Add  small amount of insulin to TNA - F/u am BMP  [name] L. Illene Bolus, PharmD, BCPS Clinical Pharmacist Pager: [phone] Pharmacy: [phone] 04/27/2012 9:24 AM

## 2012-04-27 NOTE — Progress Notes (Signed)
[id number] patient transported to OR holding area, via bed, accompanied by 2600 RN and OR tech. Patient transported on tele monitor. Patient alert, oriented, and in no distress.

## 2012-04-27 NOTE — Preoperative (Signed)
Beta Blockers   Reason not to administer Beta Blockers:Not Applicable 

## 2012-04-27 NOTE — Progress Notes (Signed)
Patient ID: [name], male   DOB: October 09, 1960, 51 y.o.   MRN: [id number] Day of Surgery  Subjective: Patient is in dialysis, appears to be in no acute distress.  Objective: Vital signs in last 24 hours: Temp:  [96.9 F (36.1 C)-98.4 F (36.9 C)] 96.9 F (36.1 C) (08/06 1428) Pulse Rate:  [79-128] 127  (08/06 1530) Resp:  [13-24] 21  (08/06 1530) BP: (112-172)/(65-92) 112/65 mmHg (08/06 1530) SpO2:  [95 %-100 %] 100 % (08/06 1500) Weight:  [113 lb 1.5 oz (51.3 kg)-121 lb 14.6 oz (55.3 kg)] 121 lb 14.6 oz (55.3 kg) (08/06 1428) Last BM Date: 04/22/12  Intake/Output from previous day: 08/05 0701 - 08/06 0700 In: 1690 [I.V.:10; IV Piggyback:100; TPN:1580] Out: 1951 [Emesis/NG output:350] Intake/Output this shift: Total I/O In: 500 [I.V.:500] Out: 545 [Other:15; Blood:530]  General appearance: alert, cooperative and no distress Chest: CTA bilaterally Abdomen: Dressing C/D/I No BS, appropriately tender. Abdomen remains taut. NG output (350 past 24 hours) Extremities: No edema Hyponatremic(chronic), WBC trending downward, VSS, afebrile. Lab Results:   Basename 04/26/12 0711 04/25/12 2058  WBC 14.5* 15.4*  HGB 10.2* 9.6*  HCT 29.0* 27.5*  PLT 129* 106*   BMET  Basename 04/27/12 0525 04/26/12 0711  NA 126* 129*  K 3.7 3.8  CL 92* 94*  CO2 16* 17*  GLUCOSE 179* 166*  BUN 87* 85*  CREATININE 6.16* 6.17*  CALCIUM 9.5 9.5   PT/INR No results found for this basename: LABPROT:2,INR:2 in the last 72 hours ABG No results found for this basename: PHART:2,PCO2:2,PO2:2,HCO3:2 in the last 72 hours  Studies/Results: No results found.  Anti-infectives: Anti-infectives     Start     Dose/Rate Route Frequency Ordered Stop   04/27/12 0000   ceFAZolin (ANCEF) IVPB 1 g/50 mL premix  Status:  Discontinued     Comments: Send with pt to OR      1 g 100 mL/hr over 30 Minutes Intravenous On call 04/26/12 0927 04/26/12 0928   04/24/12 1000   micafungin (MYCAMINE) 100 mg in  sodium chloride 0.9 % 100 mL IVPB        100 mg 100 mL/hr over 1 Hours Intravenous Daily 04/24/12 0750     04/23/12 1600   fluconazole (DIFLUCAN) IVPB 400 mg        400 mg 200 mL/hr over 60 Minutes Intravenous  Once 04/23/12 1428 04/23/12 1800   04/22/12 2200   piperacillin-tazobactam (ZOSYN) IVPB 3.375 g  Status:  Discontinued        3.375 g 100 mL/hr over 30 Minutes Intravenous 3 times per day 04/22/12 2120 04/22/12 2137   04/22/12 2200   piperacillin-tazobactam (ZOSYN) IVPB 2.25 g        2.25 g 100 mL/hr over 30 Minutes Intravenous 3 times per day 04/22/12 2140     04/22/12 2157   gentamicin (GARAMYCIN) injection  Status:  Discontinued          As needed 04/22/12 2158 04/22/12 2335   04/22/12 2156   clindamycin (CLEOCIN) injection  Status:  Discontinued          As needed 04/22/12 2157 04/22/12 2335   04/22/12 2145   piperacillin-tazobactam (ZOSYN) IVPB 3.375 g        3.375 g 12.5 mL/hr over 240 Minutes Intravenous  Once 04/22/12 2141 04/23/12 0145   04/22/12 2130   gentamicin (GARAMYCIN) 240 mg, clindamycin (CLEOCIN) 900 mg in sodium chloride irrigation 0.9 % 1,000 mL irrigation  Status:  Discontinued  Irrigation  Once 04/22/12 2119 04/23/12 0020   04/14/12 0830   ertapenem (INVANZ) 1 g in sodium chloride 0.9 % 50 mL IVPB        1 g 100 mL/hr over 30 Minutes Intravenous To Surgery 04/14/12 0816 04/14/12 0834   04/13/12 1359   ertapenem (INVANZ) 1 g in sodium chloride 0.9 % 50 mL IVPB  Status:  Discontinued        1 g 100 mL/hr over 30 Minutes Intravenous 60 min pre-op 04/13/12 1359 04/14/12 0816          Assessment/Plan: 1.  EXPLORATORY LAPAROTOMY, LOA and SBR x 2 - T. Cornett - 04/14/2012  Re-explore for leak - S. Gross - 04/22/2012   On Zosyn.   2. CKD (chronic kidney disease) stage V requiring chronic dialysis   Dialysis - T/T/Sat   3. Thrombocytopenia   Plt - 109,000 - 04/26/2012 4. Gout  5. HTN (hypertension)  6. Chronic use of steroids  7.  Malnutrition, severe - Alb - 1.7 - 04/26/2012, Prealb - 9.5 - 04/21/2012   On TPN 8. Leg graft occlusion  Unclotted - C. Dickson - 04/27/2012   1. NPO 2. IV ABX 3. TNA 4. Hyponatremic 5. elevated troponins: likely stress rxn to thrombectomy.   LOS: 25 days    DUANNE, JEREMY 04/27/2012  Agree with above.  Ovidio Kin, MD, [facility] Pager: [phone] Office phone:  [phone]

## 2012-04-27 NOTE — Progress Notes (Signed)
[facility] PROGRESS NOTE  [name] ZOX:[id number] DOB: 10-13-1960 DOA: 04/02/2012 PCP: Trevor Iha, MD  Brief narrative: 51 y/o male with ESRD and Prostatectomy in 2012. One day prior to admission he started having abdominal pain which progressively got worse - mainly periumbilical nothing made it better or worse had no radiation.  He started vomiting about 3 or 4 times and decided to come to the emergency room.  In the emergency room a CT scan of the abdomen and pelvis was done that showed small bowel obstruction.  - 7/24 underwent lysis of adhesions and partial small bowel resection (iliectomy and partial jejunectomy)  - 7//25 developed a clot in his L femoral AV graft requiring thrombectomy  - 8/2 back to OR for what was found to be a 3cm anastomotic leak with peritonitis.   Assessment/Plan:  SBO (small bowel obstruction) s/p s/p lysis of adhesions and partial small bowel resection 7/24 *Subsequent anastomosis dehiscence / SBO s/p lysis of adhesions, small bowel resection, omental patch of anastomosis 8/2 *Cont TNA via CVL per surgery.  *Still no flatus  Ileus following gastrointestinal surgery *As above - anticipated protracted postoperative course *Surgery continuing bowel rest with NG tube decompression  Suspected peritonitis secondary to anastomotic dehiscence *Cont zosyn and micafungin *Surgery maintaining wound VAC to open abdominal wound  Atypical chest pain/abnormal cardiac enzyme *Had severe chest pain Saturday night and CT of the chest revealed no evidence of pulmonary embolism or aortic dissection *He continues with burning chest pain that is likely related to NG tube although need to watch closely since CPK-MB and troponins have been steadily increasing since admission and could be related to demand ischemia *Multiple EKGs have been performed since admission including during episode of severe pain on Saturday evening and showed no definitive ischemic  changes  CKD (chronic kidney disease) stage V requiring chronic dialysis *Dialysis per nephrology  Thrombocytopenia *Occurred earlier in the admission but resolved.  *Has recurred and is likely related to surgery and peritonitis.   Anemia  *Due to acute blood loss and due to chronic disease *Receive packed red blood cells on 04/25/2012 as ordered per surgery-hemaglobin has increased from 7 to 10.2 *Cont aranesp and ferric gluconate  HTN (hypertension) *Added Hydralazine to Clonidine patch 04/25/2012.  *Continue metoprolol.   Tachycardia *Occurred today during hemodialysis and nephrologist felt was secondary to anxiety as well as increased blood flow rate-symptoms improved with decreasing blood flow rate during dialysis and the addition of when necessary Lopressor  Chronic use of steroids *Continue tapering but do not discontinue IV steroids until able to tolerate oral steroids *At home on Deltasone 5 mg daily  Malnutrition, calorie *Patient cachectic at presentation  *Now with acute malnutrition related to postoperative ileus and surgical complications *Parenteral nutrition managed by surgery and pharmacy  Leg dialysis graft occlusion *s/p thrombectomy 7/25 - unfortunately re clotted-has undergone an additional thrombectomy procedure with revision of arterial and graft on 04/27/2012 *Currently being dialyzed via groin HD catheter- VVS following  Acute respiratory failure with hypoxia- post op-  *reveals emphysematous changes- stable currently  DVT prophylaxis: SCDs Code Status: full  Disposition Plan: Remain in step down- LTAC bed tentatively available for 04/28/2012  Consultants:  General Surgery  [facility]  Events since admission: 7/13 CT Abdomen with SBO  7/24 Ex-lap, lysis of adhesions, partial small bowel resection x2  7/25 Thrombectomy L femoral AV graft  7/26 Extubated, CVL is out  7/27 Transferred to telemetry under [facility]  8/2 Acute abdomen, dehiscence  of anastomosis, SBO, back from OR intubated in ICU on PCCM service. Found to have moderate peritonitis infraumbilically 8/2 Extubated   Antibiotics: Zosyn 8/1 >>> Micafungin 8/3 >> Invanz 7/24- periop Fluconazole- 8/2- one dose  HPI/Subjective: Seen immediately postop and was very sleepy. Unable to determine if he is continuing with the intermittent burning chest pain and he has been experiencing. Did make him aware that LTAC bed tentatively available for tomorrow.  Objective: Filed Vitals:   04/27/12 1300 04/27/12 1428 04/27/12 1433 04/27/12 1500  BP: 117/74 119/73 115/66 118/73  Pulse:  93 90 128  Temp: 97.2 F (36.2 C) 96.9 F (36.1 C)    TempSrc: Axillary Oral    Resp: 16 19 24 20   Height:      Weight:  55.3 kg (121 lb 14.6 oz)    SpO2:  95%  100%    Intake/Output Summary (Last 24 hours) at 04/27/12 1517 Last data filed at 04/27/12 1014  Gross per 24 hour  Intake   1690 ml  Output    895 ml  Net    795 ml    Exam:   General:  Alert male, cachectic-appearing, in no acute distress  Cardiovascular: Accelerated Junctional rhythm versus sinus tachycardia, S1-S2, peripheral pulses palpable, no peripheral edema  Respiratory: CTA anteriorly, nasal cannula oxygen at 3 L per minute  Abdomen: Midline dressing in place- bs negative, non-distended but is tender around the wound, NG tube to low wall suction with green bilious returns  Musculoskeletal: Very thin appearing but extremities are symmetrical except for focal area of swelling on right arm between elbow and wrist which apparently is related to recent IV infiltration; otherwise nontraumatic without cyanosis or clubbing  Neurological: Patient is alert and oriented, able to express frustrations regarding current clinical status and hospitalization, exam nonfocal  Data Reviewed: Basic Metabolic Panel:  Lab 04/27/12 4098 04/26/12 0711 04/25/12 0615 04/24/12 2045 04/24/12 0411 04/23/12 0430 04/22/12 0636 04/21/12 0550    NA 126* 129* 134* 139 144 -- -- --  K 3.7 3.8 3.7 3.4* 4.9 -- -- --  CL 92* 94* 98 102 106 -- -- --  CO2 16* 17* 22 25 22  -- -- --  GLUCOSE 179* 166* 147* 136* 143* -- -- --  BUN 87* 85* 49* 31* 56* -- -- --  CREATININE 6.16* 6.17* 4.66* 3.85* 6.87* -- -- --  CALCIUM 9.5 9.5 9.1 9.1 8.6 -- -- --  MG -- 1.9 -- 1.8 2.0 1.9 2.2 --  PHOS -- 3.0 -- -- 5.2* 4.3 6.4* 5.0*   Liver Function Tests:  Lab 04/26/12 0711 04/24/12 0411 04/22/12 0636 04/21/12 0550  AST 16 25 17 21   ALT 10 9 10 11   ALKPHOS 60 55 60 61  BILITOT 1.4* 1.2 1.0 1.2  PROT 5.7* 5.4* 5.7* 5.9*  ALBUMIN 1.7* 1.8* 2.2* 2.4*   CBC:  Lab 04/26/12 0711 04/25/12 2058 04/25/12 0845 04/24/12 2045 04/24/12 0411 04/21/12 0550  WBC 14.5* 15.4* 13.2* 17.3* 17.9* --  NEUTROABS 12.3* -- -- -- -- 5.4  HGB 10.2* 9.6* 7.0* 7.5* 7.4* --  HCT 29.0* 27.5* 20.5* 22.6* 23.0* --  MCV 80.6 82.1 83.0 83.4 84.6 --  PLT 129* 106* 113* 129* 194 --   Cardiac Enzymes:  Lab 04/27/12 1300 04/27/12 0525 04/26/12 2105 04/26/12 1100 04/26/12 0711  CKTOTAL 24 23 23 27 21   CKMB 7.1* 4.7* 4.0 3.7 2.5  CKMBINDEX -- -- -- -- --  TROPONINI 2.32* 1.27* 1.86* 1.75* 1.34*   CBG:  Lab 04/27/12 1310 04/27/12 1040 04/27/12 0533 04/27/12 0015 04/26/12 2050  GLUCAP 158* 184* 171* 137* 168*    Recent Results (from the past 240 hour(s))  CULTURE, BLOOD (ROUTINE X 2)     Status: Normal (Preliminary result)   Collection Time   04/24/12 11:35 PM      Component Value Range Status Comment   Specimen Description BLOOD LEFT ARM   Final    Special Requests BOTTLES DRAWN AEROBIC ONLY 5CC   Final    Culture  Setup Time 04/25/2012 11:02   Final    Culture     Final    Value:        BLOOD CULTURE RECEIVED NO GROWTH TO DATE CULTURE WILL BE HELD FOR 5 DAYS BEFORE ISSUING A FINAL NEGATIVE REPORT   Report Status PENDING   Incomplete   CULTURE, BLOOD (ROUTINE X 2)     Status: Normal (Preliminary result)   Collection Time   04/24/12 11:40 PM      Component Value Range  Status Comment   Specimen Description BLOOD LEFT HAND   Final    Special Requests BOTTLES DRAWN AEROBIC ONLY 5CC   Final    Culture  Setup Time 04/25/2012 11:02   Final    Culture     Final    Value:        BLOOD CULTURE RECEIVED NO GROWTH TO DATE CULTURE WILL BE HELD FOR 5 DAYS BEFORE ISSUING A FINAL NEGATIVE REPORT   Report Status PENDING   Incomplete      Studies: Ct Angio Chest Pe W/cm &/or Wo Cm  04/25/2012  *RADIOLOGY REPORT*  Clinical Data: Chest pain and cough.  CT ANGIOGRAPHY CHEST  Technique:  Multidetector CT imaging of the chest using the standard protocol during bolus administration of intravenous contrast. Multiplanar reconstructed images including MIPs were obtained and reviewed to evaluate the vascular anatomy.  Contrast: OMNIPAQUE IOHEXOL 350 MG/ML SOLN  Comparison: None.  Findings: Technically adequate study with good opacification of the central and segmental pulmonary arteries.  No focal filling defects.  No evidence of significant pulmonary embolus.  Normal heart size.  Normal caliber thoracic aorta without dissection. Aortic and coronary artery calcifications.  Enteric tube with tip below the level of the stomach, not visualized.  Small amount of mucous in the trachea.  Airways generally appear patent.  There is a small left pleural effusion with basilar atelectasis or consolidation on the left.  Vague patchy infiltration in the left upper lung may represent edema or pneumonia.  No pneumothorax. There is pneumoperitoneum noted in the upper abdomen.  This likely results from recent surgery with noted laparotomy on 04/22/2012. Normal alignment of the thoracic spine.  IMPRESSION: No evidence of significant pulmonary embolus or aortic dissection. Small left pleural effusion with basilar atelectasis or consolidation.  Vague patchy infiltration in the left upper lung. Abdominal pneumoperitoneum consistent with recent surgery.  Original Report Authenticated By: Marlon Pel, M.D.    Scheduled Meds: reviewed Continuous Infusions: reviewed _________________________________________________________________  Russella Dar ANP [facility] Pager [phone] If 8PM-8AM, please contact night-coverage at [email or web address], password [facility] 04/27/2012, 3:17 PM  LOS: 25 days    Pt evaluated after dialysis- he is complaining of abdominal pain- dialysis session was shortened due to the pain- 1.5 hrs total. Pt about to receive Morphine. No other complaints-  no chest pain or dyspnea. Keep an eye on cardiac enzymes as CKMB and TN I are rising- ? From tachycardia/ relative hypotension from anaesthesia ?  BP improved after dialysis. Will allow SBP to be in 130-140 range today- change Hydralazine to PRN.   I agree with the above note.   Calvert Cantor, MD [phone]

## 2012-04-27 NOTE — Procedures (Signed)
Patient was seen on dialysis and the procedure was supervised.  BFR 400  Via thigh AVG BP is  112/65.   Patient appears to be tolerating treatment well  Baila Rouse A 04/27/2012

## 2012-04-27 NOTE — Op Note (Signed)
NAME: [name]   MRN: [id number] DOB: 1961/02/22    DATE OF OPERATION: 04/27/2012  PREOP DIAGNOSIS: clotted left thigh AV graft  POSTOP DIAGNOSIS: same  PROCEDURE:  1. Thrombectomy of left thigh AV graft 2. Revision of arterial end of graft with interposition 6 mm PTFE graft  SURGEON: Di Kindle. Edilia Bo, MD, FACS  ASSIST: Darlyn Chamber, RNFA  ANESTHESIA: Gen.   EBL: 100 cc  INDICATIONS: [name] is a 51 y.o. male has a left thigh AV graft that was placed at a different institution. He had a thrombolysis procedure in February which showed no problems with the body of the graft. He had a recent thrombectomy of his graft presumably secondary to hypotension. At that time the arterial anastomosis was explored and there were no real problems although there was significant calcific disease in the artery. The venous anastomosis was revised into the femoral vein into side. The patient had hypotension again related to bowel obstruction and his graft was noted to be clotted again.   FINDINGS: given the calcific disease in the femoral artery I elected to jump higher on the artery which had diffuse disease. Replace the proximal portion of the arterial end of the graft to  TECHNIQUE: The patient was taken to the operating room and received a general anesthetic. The left thigh was prepped and draped in the usual sterile fashion. The previous incision in the groin was opened and the arterial and venous limbs of the graft were dissected free. The patient was heparinized. Graft thrombectomy was performed using a #4 Fogarty catheter. Her no problems and pulling the catheter through the body of the graft. The venous anastomosis was inspected and was widely patent. The arterial end was patent. I was unhappy with the thrill in the graft after thrombectomy was performed in the venous end of the graft and then sewn back into him. In order to try something different I elected to revise the arterial end of  the graft by jumping higher on the artery above the area of severe calcific disease. The femoral artery proximal to the old anastomosis was then controlled proximally and distally. This had moderate diffuse disease. It was clamped proximally and distally and a longitudinal arteriotomy was made. The patient had been heparinized. The 6 mm graft slightly spatulated and sewn end to side to the artery using continuous 6-0 Prolene suture. A counterincision was made over the lateral aspect of the graft and the old segment of graft dissected free and divided. The graft was divided near the arterial anastomosis and oversewn with a 6-0 Prolene suture. The old segment of graft was removed. The new segment of 6 mm PTFE was tunneled between the 2 incisions then sewn end-to-end to the old graft using continuous 6-0 Prolene suture. Thus the proximal AV graft was revised by jumping higher in the artery and extending down onto the arterial limb of the graft. At the completion the graft was somewhat pulsatile maria after repeat thrombectomies there was no evidence of outflow stenosis that was surgically accessible. His pressure remained somewhat low. Hemostasis was obtained in the wound and the heparin was reversed with protamine. The incision over the arterial half the graft close the deep layer of 3-0 Vicryl and the skin closed with 4-0 Vicryl. The groin incision was closed with deep layer of 2-0 Vicryl. The subcutaneous layer was closed with 3-0 Vicryl. The skin was closed with staples as I felt this would be the least ischemic closure. Sterile dressing  was applied. The patient tolerated the procedure well and was transferred to the recovery room in stable condition. All needle and sponge counts were correct.  Waverly Ferrari, MD, FACS [facility]  DATE OF DICTATION:   04/27/2012

## 2012-04-27 NOTE — Anesthesia Postprocedure Evaluation (Signed)
Anesthesia Post Note  Patient: [name]  Procedure(s) Performed: Procedure(s) (LRB): THROMBECTOMY ARTERIOVENOUS GORE-TEX GRAFT (Left) REVISION OF ARTERIOVENOUS GORETEX GRAFT (Left)  Anesthesia type: General  Patient location: PACU  Post pain: Pain level controlled  Post assessment: Post-op Vital signs reviewed  Last Vitals: BP 137/69  Pulse 89  Temp 36.3 C (Oral)  Resp 15  Ht 5\' 11"  (1.803 m)  Wt 113 lb 1.5 oz (51.3 kg)  BMI 15.77 kg/m2  SpO2 100%  Post vital signs: Reviewed  Level of consciousness: sedated  Complications: No apparent anesthesia complications

## 2012-04-27 NOTE — Anesthesia Preprocedure Evaluation (Signed)
Anesthesia Evaluation  Patient identified by MRN, date of birth, ID band Patient awake    Airway Mallampati: I TM Distance: >3 FB Neck ROM: full    Dental   Pulmonary          Cardiovascular hypertension, Rhythm:regular Rate:Normal     Neuro/Psych    GI/Hepatic   Endo/Other  Type 2  Renal/GU CRF, ESRF and Dialysis     Musculoskeletal   Abdominal   Peds  Hematology   Anesthesia Other Findings   Reproductive/Obstetrics                           Anesthesia Physical Anesthesia Plan  ASA: III  Anesthesia Plan: MAC   Post-op Pain Management:    Induction: Intravenous  Airway Management Planned: Mask and LMA  Additional Equipment:   Intra-op Plan:   Post-operative Plan:   Informed Consent: I have reviewed the patients History and Physical, chart, labs and discussed the procedure including the risks, benefits and alternatives for the proposed anesthesia with the patient or authorized representative who has indicated his/her understanding and acceptance.     Plan Discussed with: CRNA, Anesthesiologist and Surgeon  Anesthesia Plan Comments:         Anesthesia Quick Evaluation

## 2012-04-28 ENCOUNTER — Inpatient Hospital Stay (HOSPITAL_COMMUNITY): Payer: Medicare Other

## 2012-04-28 ENCOUNTER — Encounter (HOSPITAL_COMMUNITY): Payer: Self-pay | Admitting: Vascular Surgery

## 2012-04-28 LAB — CARDIAC PANEL(CRET KIN+CKTOT+MB+TROPI)
CK, MB: 4.8 ng/mL — ABNORMAL HIGH (ref 0.3–4.0)
CK, MB: 4.1 ng/mL — ABNORMAL HIGH (ref 0.3–4.0)
Total CK: 32 U/L (ref 7–232)
Troponin I: 2.14 ng/mL (ref <0.30)
Troponin I: 1.91 ng/mL (ref <0.30)

## 2012-04-28 LAB — RENAL FUNCTION PANEL
Albumin: 1.5 g/dL — ABNORMAL LOW (ref 3.5–5.2)
BUN: 74 mg/dL — ABNORMAL HIGH (ref 6–23)
Chloride: 95 mEq/L — ABNORMAL LOW (ref 96–112)
GFR calc non Af Amer: 11 mL/min — ABNORMAL LOW (ref >90)
Phosphorus: 1 mg/dL — CL (ref 2.3–4.6)
Potassium: 3.1 mEq/L — ABNORMAL LOW (ref 3.5–5.1)
Sodium: 130 mEq/L — ABNORMAL LOW (ref 135–145)

## 2012-04-28 LAB — CBC
HCT: 26.1 % — ABNORMAL LOW (ref 39.0–52.0)
MCHC: 35.2 g/dL (ref 30.0–36.0)
Platelets: 109 10*3/uL — ABNORMAL LOW (ref 150–400)
RDW: 15.3 % (ref 11.5–15.5)
WBC: 22.8 10*3/uL — ABNORMAL HIGH (ref 4.0–10.5)

## 2012-04-28 LAB — BASIC METABOLIC PANEL
BUN: 69 mg/dL — ABNORMAL HIGH (ref 6–23)
Calcium: 9.4 mg/dL (ref 8.4–10.5)
GFR calc Af Amer: 13 mL/min — ABNORMAL LOW (ref >90)
GFR calc non Af Amer: 12 mL/min — ABNORMAL LOW (ref >90)
Potassium: 4.1 mEq/L (ref 3.5–5.1)
Sodium: 129 mEq/L — ABNORMAL LOW (ref 135–145)

## 2012-04-28 LAB — GLUCOSE, CAPILLARY
Glucose-Capillary: 127 mg/dL — ABNORMAL HIGH (ref 70–99)
Glucose-Capillary: 168 mg/dL — ABNORMAL HIGH (ref 70–99)
Glucose-Capillary: 131 mg/dL — ABNORMAL HIGH (ref 70–99)
Glucose-Capillary: 179 mg/dL — ABNORMAL HIGH (ref 70–99)

## 2012-04-28 MED ORDER — MORPHINE SULFATE 2 MG/ML IJ SOLN
INTRAMUSCULAR | Status: AC
  Administered 2012-04-28: 2 mg via INTRAVENOUS_CENTRAL
  Filled 2012-04-28: qty 1

## 2012-04-28 MED ORDER — POTASSIUM CHLORIDE 10 MEQ/100ML IV SOLN
INTRAVENOUS | Status: AC
  Administered 2012-04-28: 10 meq
  Filled 2012-04-28: qty 100

## 2012-04-28 MED ORDER — ZINC TRACE METAL 1 MG/ML IV SOLN
INTRAVENOUS | Status: DC
  Administered 2012-04-28: 18:00:00 via INTRAVENOUS
  Filled 2012-04-28: qty 2000

## 2012-04-28 MED ORDER — FAT EMULSION 20 % IV EMUL
240.0000 mL | INTRAVENOUS | Status: AC
  Administered 2012-04-28: 240 mL via INTRAVENOUS
  Filled 2012-04-28: qty 250

## 2012-04-28 MED ORDER — POTASSIUM PHOSPHATE DIBASIC 3 MMOLE/ML IV SOLN
15.0000 mmol | Freq: Once | INTRAVENOUS | Status: AC
  Administered 2012-04-28: 15 mmol via INTRAVENOUS
  Filled 2012-04-28: qty 5

## 2012-04-28 MED ORDER — HEPARIN SODIUM (PORCINE) 1000 UNIT/ML DIALYSIS
20.0000 [IU]/kg | INTRAMUSCULAR | Status: DC | PRN
  Filled 2012-04-28: qty 1

## 2012-04-28 MED ORDER — FENTANYL 25 MCG/HR TD PT72
25.0000 ug | MEDICATED_PATCH | TRANSDERMAL | Status: DC
  Administered 2012-04-28 – 2012-05-01 (×2): 25 ug via TRANSDERMAL
  Filled 2012-04-28 (×2): qty 1

## 2012-04-28 MED ORDER — POTASSIUM CHLORIDE 10 MEQ/50ML IV SOLN: 10.0000 meq | INTRAVENOUS | Status: AC

## 2012-04-28 NOTE — Progress Notes (Signed)
CRITICAL VALUE ALERT  Critical value received: Phosphorus 1.0  Date of notification:  04/28/2012   Time of notification:  0820  Critical value read back yes  Nurse who received alert: Dorthula Nettles  MD notified (1st page):  Dr. Kathrene Bongo  Time of first page:  0821  MD notified (2nd page):  Time of second page:  Responding MD:  Dr. Kathrene Bongo  Time MD responded:  Pt is on hemodialysis

## 2012-04-28 NOTE — Progress Notes (Signed)
[facility] PROGRESS NOTE  [name]:[id number] DOB: 1961-03-25 DOA: 04/02/2012 PCP: Trevor Iha, MD  Brief narrative: 51 y/o male with ESRD and Prostatectomy in 2012. One day prior to admission he started having abdominal pain which progressively got worse - mainly periumbilical nothing made it better or worse had no radiation.  He started vomiting about 3 or 4 times and decided to come to the emergency room.  In the emergency room a CT scan of the abdomen and pelvis was done that showed small bowel obstruction.  - 7/24 underwent lysis of adhesions and partial small bowel resection (iliectomy and partial jejunectomy)  - 7//25 developed a clot in his L femoral AV graft requiring thrombectomy  - 8/2 back to OR for what was found to be a 3cm anastomotic leak with peritonitis.   Assessment/Plan:  SBO (small bowel obstruction) s/p s/p lysis of adhesions and partial small bowel resection 7/24 *Subsequent anastomosis dehiscence / SBO s/p lysis of adhesions, small bowel resection, omental patch of anastomosis 8/2 *Cont TNA via right femoral Vas-Cath port per surgery.  *Unclear when can begin even low flow rate feeding such as postpyloric tube feedings but will defer to surgery. May need to obtain contrasted CT scan to make sure no new anastomotic leak after second surgery. *Still no flatus *Having abdominal pain despite IV morphine. 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 Unclear if during hemodialysis treatments for if NG tube is being placed to wall suction.  Suspected peritonitis secondary to anastomotic dehiscence/progressive leukocytosis *Cont zosyn and micafungin *Surgery following wound *Today white count has increased from 14,500 to 22,800. Patient continues with abdominal pain and concern for possible intra-abdominal source but will defer additional workup to surgical team. As noted we'll check plain films but may benefit from contrasted CT *We'll check blood cultures and chest x-ray  Atypical chest pain/abnormal cardiac enzyme *Had severe chest pain Saturday night and CT of the chest revealed no evidence of pulmonary embolism or aortic dissection *He continues with burning chest pain that is likely related to NG tube although need to watch closely since CPK-MB and troponins have been steadily increasing since admission and could be related to demand ischemia versus recent stressors of recurrent surgical procedures *Multiple EKGs have been performed since admission including during episode of severe pain on Saturday evening and showed no definitive ischemic changes  CKD (chronic kidney disease) stage V requiring chronic dialysis *Dialysis per nephrology  Hypokalemia/hypophosphatemia *Management per nephrology service  Thrombocytopenia *Recurrent this admission. Onset during initial portion of admission but resolved  *Suspect recurrence is related to recent peritonitis although unclear as to why progressive. May be related to parenteral nutrition although occult infectious process not completely ruled out-see above  Anemia  *Due to acute blood loss and due to chronic disease *Receive packed red blood cells on 04/25/2012 as  ordered per  surgery-hemaglobin has increased from 7 to 10.2 immediately postop and today is stable at 9.2 *Cont aranesp and ferric gluconate  HTN (hypertension) *Added IV Hydralazine to Clonidine patch 04/25/2012.  *Continue metoprolol IV.   Tachycardia *Occurred 04/27/2012 during hemodialysis and nephrologist felt was secondary to anxiety as well as increased blood flow rate-symptoms improved with decreasing blood flow rate during dialysis and the addition of when necessary Lopressor  Chronic use of steroids *Have tapered to Solu-Cortef 50 mg IV daily but will not not discontinue IV steroids until able to tolerate oral steroids *At home on Deltasone 5 mg daily  Malnutrition, calorie *Patient cachectic at presentation  *Now with acute malnutrition related to postoperative ileus and surgical complications *Parenteral nutrition managed by surgery and pharmacy *Complains of sacral and buttock pain- begin mattress overlay and chair pad  Leg dialysis graft occlusion *s/p thrombectomy 7/25 - unfortunately re clotted-has undergone an additional thrombectomy procedure with revision of arterial and graft on 04/27/2012 *Currently being dialyzed via groin HD catheter- VVS following  Acute respiratory failure with hypoxia- post op-  *reveals emphysematous changes- stable currently  DVT prophylaxis: SCDs Code Status: full  Disposition Plan: Remain in step down- LTAC bed not available today and given changes with progressive leukocytosis and increasing abdominal pain may not be ready for transfer out to LTAC as of yet  Consultants:  General Surgery  [facility]  Events since admission: 7/13 CT Abdomen with SBO  7/24 Ex-lap, lysis of adhesions, partial small bowel resection x2  7/25 Thrombectomy L femoral AV graft  7/26 Extubated, CVL is out  7/27 Transferred to telemetry under [facility]  8/2 Acute abdomen, dehiscence of anastomosis, SBO, back from OR intubated in ICU on PCCM service. Found  to have moderate peritonitis infraumbilically 8/2 Extubated  Antibiotics: Zosyn 8/1 >>> Micafungin 8/3 >> Invanz 7/24- periop Fluconazole- 8/2- one dose  HPI/Subjective: Alert and complains of periwound abdominal pain especially in the right upper quadrant as well as significant discomfort and sacral and buttock regions. Currently denies chest pain. Endorses feels full and tries to burp and unable. NG tube is clamped after return from hemodialysis and unclear if NG tube was placed to suction during hemodialysis.  Objective: Filed Vitals:   04/28/12 0837 04/28/12 0900 04/28/12 0936 04/28/12 0942  BP: 122/36 93/28 81/42  122/47  Pulse: 128 124 128 114  Temp:      TempSrc:      Resp: 23 26 26 28   Height:      Weight:    49.9 kg (110 lb 0.2 oz)  SpO2:    98%    Intake/Output Summary (Last 24 hours) at 04/28/12 1139 Last data filed at 04/28/12 1000  Gross per 24 hour  Intake   1700 ml  Output   1836 ml  Net   -136 ml    Exam:   General:  Alert male, cachectic-appearing, in no acute distress  Cardiovascular: Accelerated Junctional rhythm versus sinus tachycardia, S1-S2, peripheral pulses palpable, no peripheral edema  Respiratory: CTA anteriorly, nasal cannula oxygen at 3 L per minute  Abdomen: Midline dressing in place- bs negative, non-distended but is tender around the wound, NG tube clamped with green bilious returns, parenteral nutrition infused  Musculoskeletal: Very thin appearing but extremities are symmetrical except for focal area of swelling on right arm between elbow and wrist which apparently is related to recent IV infiltration; otherwise nontraumatic without cyanosis or clubbing  Neurological: Patient is alert and oriented, able to express frustrations regarding current clinical  status and hospitalization, exam nonfocal  Data Reviewed: Basic Metabolic Panel:  Lab 04/28/12 1610 04/28/12 0237 04/27/12 0525 04/26/12 9604 04/25/12 0615 04/24/12 2045 04/24/12  0411 04/23/12 0430 04/22/12 0636  NA 130* 129* 126* 129* 134* -- -- -- --  K 3.1* 4.1 3.7 3.8 3.7 -- -- -- --  CL 95* 95* 92* 94* 98 -- -- -- --  CO2 20 18* 16* 17* 22 -- -- -- --  GLUCOSE 106* 93 179* 166* 147* -- -- -- --  BUN 74* 69* 87* 85* 49* -- -- -- --  CREATININE 5.60* 5.21* 6.16* 6.17* 4.66* -- -- -- --  CALCIUM 9.4 9.4 9.5 9.5 9.1 -- -- -- --  MG -- -- -- 1.9 -- 1.8 2.0 1.9 2.2  PHOS 1.0* -- -- 3.0 -- -- 5.2* 4.3 6.4*   Liver Function Tests:  Lab 04/28/12 0715 04/26/12 0711 04/24/12 0411 04/22/12 0636  AST -- 16 25 17   ALT -- 10 9 10   ALKPHOS -- 60 55 60  BILITOT -- 1.4* 1.2 1.0  PROT -- 5.7* 5.4* 5.7*  ALBUMIN 1.5* 1.7* 1.8* 2.2*   CBC:  Lab 04/28/12 0715 04/26/12 0711 04/25/12 2058 04/25/12 0845 04/24/12 2045  WBC 22.8* 14.5* 15.4* 13.2* 17.3*  NEUTROABS -- 12.3* -- -- --  HGB 9.2* 10.2* 9.6* 7.0* 7.5*  HCT 26.1* 29.0* 27.5* 20.5* 22.6*  MCV 80.8 80.6 82.1 83.0 83.4  PLT 109* 129* 106* 113* 129*   Cardiac Enzymes:  Lab 04/28/12 0236 04/27/12 2130 04/27/12 1300 04/27/12 0525 04/26/12 2105  CKTOTAL 32 22 24 23 23   CKMB 4.8* 5.4* 7.1* 4.7* 4.0  CKMBINDEX -- -- -- -- --  TROPONINI 2.14* 1.49* 2.32* 1.27* 1.86*   CBG:  Lab 04/28/12 0828 04/28/12 0438 04/28/12 0032 04/27/12 2057 04/27/12 1706  GLUCAP 168* 123* 127* 152* 148*    Recent Results (from the past 240 hour(s))  CULTURE, BLOOD (ROUTINE X 2)     Status: Normal (Preliminary result)   Collection Time   04/24/12 11:35 PM      Component Value Range Status Comment   Specimen Description BLOOD LEFT ARM   Final    Special Requests BOTTLES DRAWN AEROBIC ONLY 5CC   Final    Culture  Setup Time 04/25/2012 11:02   Final    Culture     Final    Value:        BLOOD CULTURE RECEIVED NO GROWTH TO DATE CULTURE WILL BE HELD FOR 5 DAYS BEFORE ISSUING A FINAL NEGATIVE REPORT   Report Status PENDING   Incomplete   CULTURE, BLOOD (ROUTINE X 2)     Status: Normal (Preliminary result)   Collection Time   04/24/12 11:40  PM      Component Value Range Status Comment   Specimen Description BLOOD LEFT HAND   Final    Special Requests BOTTLES DRAWN AEROBIC ONLY 5CC   Final    Culture  Setup Time 04/25/2012 11:02   Final    Culture     Final    Value:        BLOOD CULTURE RECEIVED NO GROWTH TO DATE CULTURE WILL BE HELD FOR 5 DAYS BEFORE ISSUING A FINAL NEGATIVE REPORT   Report Status PENDING   Incomplete     Scheduled Meds: reviewed Continuous Infusions: reviewed _________________________________________________________________  Russella Dar ANP [facility] Pager [phone] If 8PM-8AM, please contact night-coverage at [email or web address], password [facility] 04/28/2012, 11:39 AM  LOS: 26 days    I have  personally examined this patient and reviewed the entire database. I have reviewed the above note, made any necessary editorial changes, and agree with its content.  Cherene Altes, MD [facility]

## 2012-04-28 NOTE — Progress Notes (Signed)
CRITICAL VALUE ALERT  Critical value received:  CKMB 7.1  Date of notification:  04/27/12  Time of notification:  1300  Critical value read back:yes  Nurse who received alert:  Gerald Dexter, RN  MD notified (1st page):  Dr. Butler Denmark  Time of first page: 1320  MD notified (2nd page):  Time of second page:  Responding MD:  Dr. Butler Denmark  Time MD responded:  Pt was in hemodialysis

## 2012-04-28 NOTE — Procedures (Signed)
Patient was seen on dialysis and the procedure was supervised.  BFR 150 just decreased  Via thigh AVG BP is  96/60.   Patient having difficulty.  The issue is is getting TPN at 70 per hour, has low BP and is in pain requiring narcotics.  He is not tolerating treatment well.  Will need to continue to do daily in order to stay ahead of problems  Takyla Kuchera A 04/28/2012

## 2012-04-28 NOTE — Progress Notes (Signed)
Hydrotherapy Note:   04/28/12 1343  Subjective Assessment  Subjective "Let's go for it."  Patient and Family Stated Goals Get well.  Date of Onset 04/02/12  Prior Treatments Dressing changes  Evaluation and Treatment  Evaluation and Treatment Procedures Explained to Patient/Family Yes  Evaluation and Treatment Procedures agreed to  Wound 04/27/12 Other (Comment) Abdomen Mid Midline abdominal incisional wound  Date First Assessed/Time First Assessed: 04/27/12 1326   Wound Type: Other (Comment)  Location: Abdomen  Location Orientation: Mid  Wound Description (Comments): Midline abdominal incisional wound  Site / Wound Assessment Black;Red;Yellow  % Wound base Red or Granulating 10%  % Wound base Yellow 50%  % Wound base Black 40%  Peri-wound Assessment Intact  Margins Attached edges (approximated)  Closure None  Drainage Amount Minimal  Drainage Description Serosanguineous;No odor  Non-staged Wound Description Partial thickness  Treatment Hydrotherapy (Pulse lavage);Debridement (Selective);Packing (Saline gauze) (NS W to D 4x4, dry 4x4, ABD, skin prep, tape)  Dressing Type Moist to dry;Gauze (Comment);ABD (NS W to D 4x4, dry 4x4, abd, skin prep, tape)  Dressing Changed New  Dressing Status Clean  Hydrotherapy  Pulsed Lavage with Suction (psi) 8 psi  Pulsed Lavage with Suction - Normal Saline Used 1000 mL  Pulsed Lavage Tip Tip with splash shield  Pulsed lavage therapy - wound location Abdomen  Selective Debridement  Selective Debridement - Location Abdomen  Selective Debridement - Tools Used Forceps;Scissors  Selective Debridement - Tissue Removed Yellow and black slough.  Wound Therapy - Assess/Plan/Recommendations  Wound Therapy - Clinical Statement Pt is a 51 y/o male with an abdominal midline incisional wound.  Pt would benefit from continued hydrotherapy to assist with cleansing and debridement while facilitating wound healing.  Wound Therapy - Functional Problem List  Decreased skin integrity causing increased chance for infection.  Factors Delaying/Impairing Wound Healing Infection - systemic/local;Immobility;Multiple medical problems;Polypharmacy  Hydrotherapy Plan Debridement;Dressing change;Patient/family education;Pulsatile lavage with suction  Wound Therapy - Frequency 6X / week  Wound Therapy - Current Recommendations PT;OT;Surgery consult;WOC nurse  Wound Therapy - Follow Up Recommendations Home health RN  Wound Plan Pulse lavage, debridement, dressing change 6x/week.  Wound Therapy Goals - Improve the function of patient's integumentary system by progressing the wound(s) through the phases of wound healing by:  Decrease Necrotic Tissue - Progress Progressing toward goal  Increase Granulation Tissue - Progress Progressing toward goal  Improve Drainage Characteristics - Progress Progressing toward goal  Goals/treatment plan/discharge plan were made with and agreed upon by patient/family Yes  Wound Therapy - Potential for Goals Good    Pain:  None reported.  Pt premedicated.  04/28/2012 Cephus Shelling, PT, DPT [phone]

## 2012-04-28 NOTE — Progress Notes (Addendum)
[facility] Progress Note  Subjective:  Patient seen on HD- again not tolerating well.  Low BP complaining of pain.  Will need to sign off early   Objective Filed Vitals:   04/28/12 0730 04/28/12 0800 04/28/12 0837 04/28/12 0900  BP: 125/54 96/50 122/36 93/28  Pulse: 104 127 128 124  Temp:      TempSrc:      Resp: 26 27 23 26   Height:      Weight:      SpO2:       Physical Exam General: tired, but alert- complaining of many things on the machine Heart: RRR Lungs: mostly clear Abdomen: distended, slightly painful Extremities: no peripheral edema Dialysis Access: left thigh AVGG patent; dressing intact with scant bleeding.  Dialysis Orders: Center: [facility] on TTS.  EDW 52.5 kg HD Bath 2K/2Ca Time 3.5 hrs Heparin 5000 U. Access AVG @ left thigh BFR 400 DFR 800 Zemplar 0 mcg IV/HD Epogen 0 Units IV/HD Venofer 50/week; last iPTH 314, but product high and no zemplar  Assessment/Plan:  1. SBO, s/p exp lap/SB resection 7/24 Dr. Luisa Hart, c/w dehiscence with peritonitis, back to OR 8/1 for further SB resection and reanastomosis. On IV zosyn and micafungin. NG in place/clamped no drainage recorded,  Still with ileus/NGT per surgery.  2. ESRD - HD TTS at [facility]. temp R groin cath still in place, they are using for TPN , thigh graft s/p declot is functioning ok.  HD daily for a while to keep up with volume, patient is only tolerating for just over 2 hours at a time lately.  4 K bath. Hypertension/volume - was on no BP meds at home. BP was up now is low.  Will d/c clonidine patch.  Keep metoprolol given sinus tach.   Steroids being weaned. Daily HD should keep in check 3. Anemia of CKD - Getting max Aranesp.  < 10; on full course of IV Fe; ferritin 552. Hb over 9 after transfusion over weekend 4. Metabolic bone disease - Fosrenol was resumed at 1gm po [facility], holding since NPO. Phos 3 Monday.  5. Protein calorie malnutrition- Pre-albumin very low; on TNA 70cc/hr (1680 = any other fluids)  today. Getting lipids MWF due to shortage; getting phos and K repletion today 6. S/p renal transplant - [facility] in [facility]87, started HD in 04/2003, remains on Prednisone.       9. Hepatitis C/thrombocytopenia - stable  Seems unstable right now for the moment.  Continue Daily HD and watch closely.   [name] A  04/28/2012      Additional Objective Labs: Basic Metabolic Panel:  Lab 04/28/12 1610 04/28/12 0237 04/27/12 0525 04/26/12 0711 04/24/12 0411  NA 130* 129* 126* -- --  K 3.1* 4.1 3.7 -- --  CL 95* 95* 92* -- --  CO2 20 18* 16* -- --  GLUCOSE 106* 93 179* -- --  BUN 74* 69* 87* -- --  CREATININE 5.60* 5.21* 6.16* -- --  CALCIUM 9.4 9.4 9.5 -- --  ALB -- -- -- -- --  PHOS 1.0* -- -- 3.0 5.2*   Liver Function Tests:  Lab 04/28/12 0715 04/26/12 0711 04/24/12 0411 04/22/12 0636  AST -- 16 25 17   ALT -- 10 9 10   ALKPHOS -- 60 55 60  BILITOT -- 1.4* 1.2 1.0  PROT -- 5.7* 5.4* 5.7*  ALBUMIN 1.5* 1.7* 1.8* --   CBC:  Lab 04/28/12 0715 04/26/12 0711 04/25/12 2058 04/25/12 0845 04/24/12 2045  WBC 22.8* 14.5*  15.4* -- --  NEUTROABS -- 12.3* -- -- --  HGB 9.2* 10.2* 9.6* -- --  HCT 26.1* 29.0* 27.5* -- --  MCV 80.8 80.6 82.1 83.0 83.4  PLT 109* 129* 106* -- --  Blood Culture    Component Value Date/Time   SDES BLOOD LEFT HAND 04/24/2012 2340   SPECREQUEST BOTTLES DRAWN AEROBIC ONLY 5CC 04/24/2012 2340   CULT        BLOOD CULTURE RECEIVED NO GROWTH TO DATE CULTURE WILL BE HELD FOR 5 DAYS BEFORE ISSUING A FINAL NEGATIVE REPORT 04/24/2012 2340   REPTSTATUS PENDING 04/24/2012 2340  Cardiac Enzymes:  Lab 04/28/12 0236 04/27/12 2130 04/27/12 1300 04/27/12 0525 04/26/12 2105  CKTOTAL 32 22 24 23 23   CKMB 4.8* 5.4* 7.1* 4.7* 4.0  CKMBINDEX -- -- -- -- --  TROPONINI 2.14* 1.49* 2.32* 1.27* 1.86*  CBG:  Lab 04/28/12 0828 04/28/12 0438 04/28/12 0032 04/27/12 2057 04/27/12 1706  GLUCAP 168* 123* 127* 152* 148*  Medications:    . fat emulsion    . fat emulsion Stopped  (04/27/12 0624)  . TPN [facility]) +/- additives Stopped (04/27/12 1610)  . TPN (CLINIMIX) +/- additives 70 mL/hr at 04/27/12 1757  . TPN (CLINIMIX) +/- additives        . antiseptic oral rinse  15 mL Mouth Rinse q12n4p  . cloNIDine  0.2 mg Transdermal Weekly  . darbepoetin (ARANESP) injection - DIALYSIS  200 mcg Intravenous Q Sat-HD  . ferric gluconate (FERRLECIT/NULECIT) IV  125 mg Intravenous Q T,Th,Sa-HD  . hydrocortisone sodium succinate  50 mg Intravenous Daily  . insulin aspart  0-15 Units Subcutaneous Q4H  . lip balm   Topical BID  . metoprolol  5 mg Intravenous Q6H  . micafungin (MYCAMINE) IV  100 mg Intravenous Daily  . morphine      . pantoprazole (PROTONIX) IV  40 mg Intravenous Q24H  . piperacillin-tazobactam (ZOSYN)  IV  2.25 g Intravenous Q8H  . potassium chloride  10 mEq Intravenous Q1 Hr x 2  . potassium phosphate IVPB (mmol)  15 mmol Intravenous Once  . sodium chloride  10-40 mL Intracatheter Q12H  . DISCONTD: hydrALAZINE  10 mg Intravenous Q6H

## 2012-04-28 NOTE — Progress Notes (Signed)
Nutrition Follow-up  Intervention:   1. Continue TPN per pharmacy 2. RD to continue to follow nutrition care plan   Assessment:   Underwent thrombectomy of L AVG on 8/6. Per nephrology notes, pt having difficulty with tolerating HD. Will need to do HD daily for now.  Patient continues to receive TPN with Clinimix 5/15 @ 70 ml/hr. Lipids (20% IVFE @ 10 ml/hr), multivitamins, and trace elements are provided 3 times weekly (MWF) due to national backorder. Provides 1400 kcal and 84 grams protein daily (based on weekly average). Meets 82% minimum estimated kcal and 100% minimum estimated protein needs.  Discussed option of initiation of trophic feedings with MD. Deferred decision to surgery.  Diet Order:  NPO  Meds: Scheduled Meds:   . antiseptic oral rinse  15 mL Mouth Rinse q12n4p  . cloNIDine  0.2 mg Transdermal Weekly  . darbepoetin (ARANESP) injection - DIALYSIS  200 mcg Intravenous Q Sat-HD  . ferric gluconate (FERRLECIT/NULECIT) IV  125 mg Intravenous Q T,Th,Sa-HD  . hydrocortisone sodium succinate  50 mg Intravenous Daily  . insulin aspart  0-15 Units Subcutaneous Q4H  . lip balm   Topical BID  . metoprolol  5 mg Intravenous Q6H  . micafungin (MYCAMINE) IV  100 mg Intravenous Daily  . morphine      . pantoprazole (PROTONIX) IV  40 mg Intravenous Q24H  . piperacillin-tazobactam (ZOSYN)  IV  2.25 g Intravenous Q8H  . potassium chloride  10 mEq Intravenous Q1 Hr x 2  . potassium phosphate IVPB (mmol)  15 mmol Intravenous Once  . sodium chloride  10-40 mL Intracatheter Q12H  . DISCONTD: hydrALAZINE  10 mg Intravenous Q6H   Continuous Infusions:   . fat emulsion    . fat emulsion Stopped (04/27/12 0624)  . TPN [facility]) +/- additives Stopped (04/27/12 4782)  . TPN (CLINIMIX) +/- additives 70 mL/hr at 04/27/12 1757  . TPN (CLINIMIX) +/- additives     PRN Meds:.acetaminophen, acetaminophen, albuterol, heparin, hydrALAZINE, morphine injection, ondansetron (ZOFRAN) IV,  ondansetron, promethazine, sodium chloride, DISCONTD: 0.9 % irrigation (POUR BTL), DISCONTD: heparin 6000 unit irrigation, DISCONTD: heparin, DISCONTD: heparin, DISCONTD: heparin, DISCONTD:  HYDROmorphone (DILAUDID) injection, DISCONTD: ondansetron (ZOFRAN) IV, DISCONTD: Surgifoam 100 with Thrombin 20,000 units (20 ml) topical solution  Labs:  CMP     Component Value Date/Time   NA 130* 04/28/2012 0715   K 3.1* 04/28/2012 0715   CL 95* 04/28/2012 0715   CO2 20 04/28/2012 0715   GLUCOSE 106* 04/28/2012 0715   BUN 74* 04/28/2012 0715   CREATININE 5.60* 04/28/2012 0715   CALCIUM 9.4 04/28/2012 0715   PROT 5.7* 04/26/2012 0711   ALBUMIN 1.5* 04/28/2012 0715   AST 16 04/26/2012 0711   ALT 10 04/26/2012 0711   ALKPHOS 60 04/26/2012 0711   BILITOT 1.4* 04/26/2012 0711   GFRNONAA 11* 04/28/2012 0715   GFRAA 12* 04/28/2012 0715   Phosphorus  Date/Time Value Range Status  04/28/2012  7:15 AM 1.0* 2.3 - 4.6 mg/dL Final     CRITICAL RESULT CALLED TO, READ BACK BY AND VERIFIED WITH:     T CARBONE,RN AT 0820 04/28/12 BY KBARR  04/26/2012  7:11 AM 3.0  2.3 - 4.6 mg/dL Final  05/27/6212  0:86 AM 5.2* 2.3 - 4.6 mg/dL Final   Potassium  Date/Time Value Range Status  04/28/2012  7:15 AM 3.1* 3.5 - 5.1 mEq/L Final  04/28/2012  2:37 AM 4.1  3.5 - 5.1 mEq/L Final  04/27/2012  5:25 AM 3.7  3.5 -  5.1 mEq/L Final   Magnesium  Date/Time Value Range Status  04/26/2012  7:11 AM 1.9  1.5 - 2.5 mg/dL Final  03/30/2955  2:13 PM 1.8  1.5 - 2.5 mg/dL Final  0/04/6577  4:69 AM 2.0  1.5 - 2.5 mg/dL Final   Prealbumin  Date/Time Value Range Status  04/26/2012  7:11 AM 7.7* 17.0 - 34.0 mg/dL Final  03/21/5283  1:32 AM 9.5* 17.0 - 34.0 mg/dL Final  4/40/1027  2:53 AM 13.8* 17.0 - 34.0 mg/dL Final    Intake/Output Summary (Last 24 hours) at 04/28/12 0935 Last data filed at 04/28/12 0700  Gross per 24 hour  Intake   2060 ml  Output   1032 ml  Net   1028 ml  BM 8/1  Weight Status:  50.5 kg s/p HD on 8/6 - trending up 50 kg s/p HD on 8/5 47.8 kg s/p HD  on 7/30  48.3 kg s/p HD on 7/27  Estimated needs:  [calculated using the Kidney Disease Outcomes Quality Initiative (KDOQI) Adjustment Equation for the Underweight Patient]: 1700 - 1950 kcal, 80 - 100 grams protein daily  Nutrition Dx:  Inadequate oral intake now R/T GI distress AEB pt report. Ongoing.  Goal:  Initiation of TPN; intake to meet at least 90% of estimated needs. Unmet.  Monitor:  Diet advancement, TPN adequacy, weights, labs, I/O's  Jarold Motto MS, RD, LDN Pager: [phone] After-hours pager: [phone]

## 2012-04-28 NOTE — Progress Notes (Signed)
PT Cancellation Note  Treatment cancelled today due to medical issues with patient which prohibited therapy.  Patient still has temporary HD cath in groin.  Supposed to possibly be removed today.  Will f/u tomorrow.  Thanks.  INGOLD,Laney Bagshaw 04/28/2012, 9:44 AM  [facility] [phone] (pager)

## 2012-04-28 NOTE — Progress Notes (Signed)
Patient ID: [name], male   DOB: 01-Oct-1960, 51 y.o.   MRN: [id number] Patient ID: [name], male   DOB: 19-Sep-1961, 19 y.o.   MRN: [id number] 1 Day Post-Op  Subjective: Patient is resting quietly in bed, easily awoken to voice. Appears to be in no acute distress. Per patient he ended his dialysis treatment early secondary to "could not tolerate it". Notes indicate hypotension during dialysis as reason for termination of tx.  Objective: Vital signs in last 24 hours: Temp:  [96.9 F (36.1 C)-98.5 F (36.9 C)] 98.1 F (36.7 C) (08/07 1300) Pulse Rate:  [95-132] 99  (08/07 1300) Resp:  [17-28] 21  (08/07 1300) BP: (81-132)/(28-76) 108/57 mmHg (08/07 1300) SpO2:  [94 %-99 %] 99 % (08/07 1300) Weight:  [108 lb 0.4 oz (49 kg)-119 lb 14.9 oz (54.4 kg)] 110 lb 0.2 oz (49.9 kg) (08/07 0942) Last BM Date: 04/22/12  Intake/Output from previous day: 08/06 0701 - 08/07 0700 In: 2440 [I.V.:500; IV Piggyback:100; TPN:1840] Out: 1462 [Blood:530] Intake/Output this shift: Total I/O In: 640 [TPN:640] Out: 919 [Other:919]  General appearance: alert, cooperative and no distress Chest: CTA bilaterally, still with c/o cp w/o radiation, sob, diaphoresis, syncope. Abdomen: Dressing C/D/I No BS, appropriately tender. LLQ > RLQ with palpation described as a cramping type of pain that goes away for a while with pain meds.  Abdomen remains firm, but no guarding. No N/V   NG (none recorded for past 24 hours); NG in place draining bilious material (currently 180 ml in cannister) Extremities: No edema Hyponatremic(chronic), hypokalemic (post dialysis) WBC trending upward (?cause). He has been started on Zosyn by medicine team.  Afebrile. BP currently 121/51 with hr 88 with occasional pvc's. Lab Results:   Basename 04/28/12 0715 04/26/12 0711  WBC 22.8* 14.5*  HGB 9.2* 10.2*  HCT 26.1* 29.0*  PLT 109* 129*   BMET  Basename 04/28/12 0715 04/28/12 0237  NA 130* 129*  K 3.1* 4.1  CL 95* 95*    CO2 20 18*  GLUCOSE 106* 93  BUN 74* 69*  CREATININE 5.60* 5.21*  CALCIUM 9.4 9.4   PT/INR No results found for this basename: LABPROT:2,INR:2 in the last 72 hours ABG No results found for this basename: PHART:2,PCO2:2,PO2:2,HCO3:2 in the last 72 hours  Studies/Results: Dg Chest Port 1 View  04/28/2012  *RADIOLOGY REPORT*  Clinical Data: Progressive leukocytosis.  PORTABLE CHEST - 1 VIEW  Comparison: Chest x-ray dated 04/23/2012 and chest CT dated 04/25/2012  Findings: There is a persistent moderate left effusion with compressive atelectasis in the left lower lobe.  Heart size and vascularity are normal.  Right lung is clear.  NG tube tip is below the diaphragm.  IMPRESSION: Progressive left pleural effusion and left lower lobe atelectasis.  Original Report Authenticated By: Gwynn Burly, M.D.   Dg Abd Portable 1v  04/28/2012  *RADIOLOGY REPORT*  Clinical Data: Abdominal pain.  Ileus.  Progressive leukocytosis.  PORTABLE ABDOMEN - 1 VIEW  Comparison: Radiographs dated 04/20/2012  Findings: NG tube tip is in the body of the stomach.  There are no dilated loops of bowel.  Double-lumen catheter is in the right femoral vein with the tips in the inferior vena cava.  Contrast is seen in the gallbladder.  There is also scattered high density material in the bowel.  IMPRESSION: No acute abnormalities.  Original Report Authenticated By: Gwynn Burly, M.D.    Anti-infectives: Anti-infectives     Start     Dose/Rate Route Frequency  Ordered Stop   04/27/12 0000   ceFAZolin (ANCEF) IVPB 1 g/50 mL premix  Status:  Discontinued     Comments: Send with pt to OR      1 g 100 mL/hr over 30 Minutes Intravenous On call 04/26/12 0927 04/26/12 0928   04/24/12 1000   micafungin (MYCAMINE) 100 mg in sodium chloride 0.9 % 100 mL IVPB        100 mg 100 mL/hr over 1 Hours Intravenous Daily 04/24/12 0750     04/23/12 1600   fluconazole (DIFLUCAN) IVPB 400 mg        400 mg 200 mL/hr over 60 Minutes  Intravenous  Once 04/23/12 1428 04/23/12 1800   04/22/12 2200   piperacillin-tazobactam (ZOSYN) IVPB 3.375 g  Status:  Discontinued        3.375 g 100 mL/hr over 30 Minutes Intravenous 3 times per day 04/22/12 2120 04/22/12 2137   04/22/12 2200   piperacillin-tazobactam (ZOSYN) IVPB 2.25 g        2.25 g 100 mL/hr over 30 Minutes Intravenous 3 times per day 04/22/12 2140     04/22/12 2157   gentamicin (GARAMYCIN) injection  Status:  Discontinued          As needed 04/22/12 2158 04/22/12 2335   04/22/12 2156   clindamycin (CLEOCIN) injection  Status:  Discontinued          As needed 04/22/12 2157 04/22/12 2335   04/22/12 2145   piperacillin-tazobactam (ZOSYN) IVPB 3.375 g        3.375 g 12.5 mL/hr over 240 Minutes Intravenous  Once 04/22/12 2141 04/23/12 0145   04/22/12 2130   gentamicin (GARAMYCIN) 240 mg, clindamycin (CLEOCIN) 900 mg in sodium chloride irrigation 0.9 % 1,000 mL irrigation  Status:  Discontinued         Irrigation  Once 04/22/12 2119 04/23/12 0020   04/14/12 0830   ertapenem (INVANZ) 1 g in sodium chloride 0.9 % 50 mL IVPB        1 g 100 mL/hr over 30 Minutes Intravenous To Surgery 04/14/12 0816 04/14/12 0834   04/13/12 1359   ertapenem (INVANZ) 1 g in sodium chloride 0.9 % 50 mL IVPB  Status:  Discontinued        1 g 100 mL/hr over 30 Minutes Intravenous 60 min pre-op 04/13/12 1359 04/14/12 0816          Assessment/Plan: 1.  EXPLORATORY LAPAROTOMY, LOA and SBR x 2 - T. Cornett - 04/14/2012  Re-explore for leak - S. Gross - 04/22/2012   On Zosyn.     WBC increase - 22,800 - 04/28/2012  Abdomen no worse or better.  No evidence of acute change.  2. CKD (chronic kidney disease) stage V requiring chronic dialysis   Dialysis - T/T/Sat   3. Thrombocytopenia   Plt - 109,000 - 04/26/2012 4. Gout  5. HTN (hypertension)  6. Chronic use of steroids  7. Malnutrition, severe - Alb - 1.7 - 04/26/2012, Prealb - 9.5 - 04/21/2012   On TPN 8. Leg graft occlusion   Unclotted -  C. Dickson - 04/27/2012  Plan: 1. NPO 2. IV ABX 3. TNA 4. Hyponatremic 5. Hypokalenic 6. Elevated troponins: (being managed by medicine team) 7. Will recheck cbc, bmp in am.  8. CT of abdomen in am if WBC continue to trend upward    LOS: 26 days    [name], [name] 04/28/2012  As above. Increased WBC unexplained, but patient has been to surgery and  in dialysis x 2 in the last 24 hours. Wound okay.  Ovidio Kin, MD, [facility] Pager: [phone] Office phone:  [phone]

## 2012-04-28 NOTE — Progress Notes (Addendum)
PARENTERAL NUTRITION CONSULT NOTE - FOLLOW UP  Pharmacy Consult for TPN Indication: SBO, s/p LOA/SBR/anastamosis , s/p LOA/SBR/re-anastamosis with patch repair for leak  Allergies  Allergen Reactions  . Allopurinol     REACTION: decreased platelets  . Aspirin     REACTION: unspecified    Patient Measurements: Height: 5\' 11"  (180.3 cm) Weight: 108 lb 0.4 oz (49 kg) (all pillows removed) IBW/kg (Calculated) : 75.3   Vital Signs: Temp: 96.9 F (36.1 C) (08/07 0700) Temp src: Oral (08/07 0700) BP: 96/50 mmHg (08/07 0800) Pulse Rate: 127  (08/07 0800) Intake/Output from previous day: 08/06 0701 - 08/07 0700 In: 2440 [I.V.:500; IV Piggyback:100; TPN:1840] Out: 1462 [Blood:530] Intake/Output from this shift:    Labs:  [facility] 04/28/12 0715 04/26/12 0711 04/25/12 2058  WBC 22.8* 14.5* 15.4*  HGB 9.2* 10.2* 9.6*  HCT 26.1* 29.0* 27.5*  PLT 109* 129* 106*  APTT -- -- --  INR -- -- --     [name] 04/28/12 0715 04/28/12 0237 04/27/12 0525 04/26/12 0711  NA 130* 129* 126* --  K 3.1* 4.1 3.7 --  CL 95* 95* 92* --  CO2 20 18* 16* --  GLUCOSE 106* 93 179* --  BUN 74* 69* 87* --  CREATININE 5.60* 5.21* 6.16* --  LABCREA -- -- -- --  CREAT24HRUR -- -- -- --  CALCIUM 9.4 9.4 9.5 --  MG -- -- -- 1.9  PHOS 1.0* -- -- 3.0  PROT -- -- -- 5.7*  ALBUMIN 1.5* -- -- 1.7*  AST -- -- -- 16  ALT -- -- -- 10  ALKPHOS -- -- -- 60  BILITOT -- -- -- 1.4*  BILIDIR -- -- -- --  IBILI -- -- -- --  PREALBUMIN -- -- -- 7.7*  TRIG -- -- -- 72  CHOLHDL -- -- -- --  CHOL -- -- -- 83   Estimated Creatinine Clearance: 10.7 ml/min (by C-G formula based on Cr of 5.6).    Basename 04/28/12 0828 04/28/12 0438 04/28/12 0032  GLUCAP 168* 123* 127*    Insulin Requirements in the past 24 hours:  7 units Novolog. Currently on Moderate SSI q4h with 10 units insulin in TNA  Current Nutrition:  NPO, Clinimix 5/15 at 70 ml/hr with lipids at 10 ml/hr MWF only  Admit:  Admitted 7/13 with abd  pain, weight loss, SBO. Presently in the OR for repeat thrombectomy of L thigh AV Graft. (s/p thrombectomy 7/25).  GI-  SBO, back to OR 8/1 for anastomotic leak- now s/p patch repair.  Has been essentially NPO since admit, therefore at significant risk for re-feeding, but has been tolerating TPN thus far. NGT output 350 as opf 86 am, none recorded since then. Endo: Moderate SSI q4, on Solu-Cortef-weaned to daily now. CBG currently at goal of <180 with insulin in TNA.   Lytes:  Na low, likely d/t overload and Clinimix without supplemented lytes. K, phos low.  Corrected Ca =11.4, high despite no lytes in TNA. Renal:  ESRD w/HD typically T/TH/Sat - daily for now; Ferrlecit, Aranesp-Sat Pulm: extubated; now on RA Cards: hypertensive. Pos trop, on prn anti-HTNs Hepatobil: LFTs, trig and chol WNL.  Prealbumin decreased - will follow. Neuro:  Morphine prn for severe pain ID: Afeb, WBC elevated. Lower abdominal peritonitis- on Mycamine and  renally adjusted Zosyn Best Practices:  Mouthcare, PPI-IV, scd's ordered, pt refusing  Nutritional Goals:  1700-1950 kCal, 75-85 grams of protein per day  Plan:  - Continue Clinimix 5/15 to 70 ml/hr, which  meets protein goal but is just below kcal. - Provide MVI and lipids (@10cc /hr) on MWF only d/t national backorder. - Continue 10 units insulin in TNA - Replete K, Phos with 15 mmol K phos and 2 K runs - F/u am TNA labs  [name], PharmD, BCPS Clinical Pharmacist Pager: [phone] Pharmacy: [phone] 04/28/2012 8:40 AM

## 2012-04-28 NOTE — Progress Notes (Signed)
Patient received two hours of hemodialysis treatment today. Patient was ordered a 4 hour treatment, but do to complaints of pain and low blood pressure, patient has requested to end his treatment. Patient was given morphine 2mg  with little relief. Upon discharge patients blood pressure was 122/47 with a heart rate of 109, which has been the patient's baseline. Patient states that he feels much better now that his hemodialysis treatment has ended. Patient has been advised that it is very important that he complete his dialysis treatment due to the volume that he is receiving with TPN. Patient expresses an understanding but is uncomfortable at this time, and is very adamant about ending his treatment. Dr Kathrene Bongo is at the bedside and has provided education regarding this matter.

## 2012-04-29 ENCOUNTER — Inpatient Hospital Stay (HOSPITAL_COMMUNITY): Payer: Medicare Other

## 2012-04-29 ENCOUNTER — Encounter (HOSPITAL_COMMUNITY): Payer: Self-pay | Admitting: Radiology

## 2012-04-29 LAB — COMPREHENSIVE METABOLIC PANEL
ALT: 14 U/L (ref 0–53)
AST: 24 U/L (ref 0–37)
CO2: 22 mEq/L (ref 19–32)
Chloride: 96 mEq/L (ref 96–112)
Creatinine, Ser: 4.62 mg/dL — ABNORMAL HIGH (ref 0.50–1.35)
GFR calc Af Amer: 15 mL/min — ABNORMAL LOW (ref >90)
GFR calc non Af Amer: 13 mL/min — ABNORMAL LOW (ref >90)
Glucose, Bld: 137 mg/dL — ABNORMAL HIGH (ref 70–99)
Sodium: 131 mEq/L — ABNORMAL LOW (ref 135–145)
Total Bilirubin: 1.2 mg/dL (ref 0.3–1.2)

## 2012-04-29 LAB — CBC
HCT: 21.8 % — ABNORMAL LOW (ref 39.0–52.0)
HCT: 22.3 % — ABNORMAL LOW (ref 39.0–52.0)
Hemoglobin: 7.4 g/dL — ABNORMAL LOW (ref 13.0–17.0)
MCH: 27.8 pg (ref 26.0–34.0)
MCHC: 33.9 g/dL (ref 30.0–36.0)
MCHC: 33.2 g/dL (ref 30.0–36.0)
MCV: 81.3 fL (ref 78.0–100.0)
MCV: 83.8 fL (ref 78.0–100.0)
Platelets: 139 10*3/uL — ABNORMAL LOW (ref 150–400)
RBC: 2.66 MIL/uL — ABNORMAL LOW (ref 4.22–5.81)
RDW: 15.6 % — ABNORMAL HIGH (ref 11.5–15.5)

## 2012-04-29 LAB — GLUCOSE, CAPILLARY
Glucose-Capillary: 123 mg/dL — ABNORMAL HIGH (ref 70–99)
Glucose-Capillary: 129 mg/dL — ABNORMAL HIGH (ref 70–99)
Glucose-Capillary: 132 mg/dL — ABNORMAL HIGH (ref 70–99)
Glucose-Capillary: 161 mg/dL — ABNORMAL HIGH (ref 70–99)
Glucose-Capillary: 191 mg/dL — ABNORMAL HIGH (ref 70–99)

## 2012-04-29 LAB — PREPARE RBC (CROSSMATCH)

## 2012-04-29 MED ORDER — IOHEXOL 300 MG/ML  SOLN
100.0000 mL | Freq: Once | INTRAMUSCULAR | Status: AC | PRN
  Administered 2012-04-29: 100 mL via INTRAVENOUS

## 2012-04-29 MED ORDER — VANCOMYCIN HCL 1000 MG IV SOLR
1500.0000 mg | Freq: Once | INTRAVENOUS | Status: AC
  Administered 2012-04-30: 1500 mg via INTRAVENOUS
  Filled 2012-04-29: qty 1500

## 2012-04-29 MED ORDER — HYDROCORTISONE SOD SUCCINATE 100 MG IJ SOLR
25.0000 mg | Freq: Two times a day (BID) | INTRAMUSCULAR | Status: DC
  Administered 2012-04-30 – 2012-05-07 (×15): 25 mg via INTRAVENOUS
  Filled 2012-04-29 (×19): qty 0.5

## 2012-04-29 MED ORDER — POTASSIUM PHOSPHATE DIBASIC 3 MMOLE/ML IV SOLN
10.0000 mmol | Freq: Once | INTRAVENOUS | Status: AC
  Administered 2012-04-29: 10 mmol via INTRAVENOUS
  Filled 2012-04-29 (×2): qty 3.33

## 2012-04-29 MED ORDER — INSULIN REGULAR HUMAN 100 UNIT/ML IJ SOLN
INTRAVENOUS | Status: AC
  Administered 2012-04-29: 17:00:00 via INTRAVENOUS
  Filled 2012-04-29: qty 2000

## 2012-04-29 MED ORDER — FLEET ENEMA 7-19 GM/118ML RE ENEM
1.0000 | ENEMA | Freq: Two times a day (BID) | RECTAL | Status: AC
  Administered 2012-04-29: 1 via RECTAL
  Filled 2012-04-29 (×4): qty 1

## 2012-04-29 NOTE — Progress Notes (Signed)
PT Discharge Note MD:  Spoke with nursing to enquire if and when temporary hemodialysis catheter was to be removed.  Nursing states that it cannot be removed due to TPN needing access.  Therefore, will sign off at this time as PT is limited until the temporary catheter is removed.   Thanks. [facility] [phone] (pager)

## 2012-04-29 NOTE — Progress Notes (Signed)
Pt returned from HD at 1310 hrs, c/o nausea, NGT placed to LIWS. 60cc of bilious green fluid out on starting suction.  Pt c/o pain in abdomen.  Given zofran for nausea and morphine for pain.  Refusing enema at this time.  PT here to give hydrotherapy at this time.  Will notify Doctor of refused enema.

## 2012-04-29 NOTE — Progress Notes (Addendum)
  VASCULAR AND VEIN SURGERY PROGRESS NOTE  Progress note  Date of Surgery: 04/02/2012 - 04/27/2012 Surgeon: Moishe Spice): Chuck Hint, MD 2 Days Post-Op Left Procedure(s): THROMBECTOMY ARTERIOVENOUS GORE-TEX GRAFT REVISION OF ARTERIOVENOUS GORETEX GRAFT Graft is patent and functional    Assessment/Plan  DELMAN GOSHORN is a 51 y.o. year old male who is S/P Procedure(s):  THROMBECTOMY ARTERIOVENOUS GORE-TEX GRAFT  REVISION OF ARTERIOVENOUS GORETEX GRAFT  COLLINS, EMMA MAUREEN 04/29/2012 8:29 AM  Agree with above. Will be available as needed.  Di Kindle. Edilia Bo, MD, FACS [facility] [phone] 04/29/2012

## 2012-04-29 NOTE — Progress Notes (Addendum)
Pt scheduled for CT of abdomen.  Radiology has instructed 480cc of contrast to be taken.  Pt became nauseated earlier after clamping for 3 hours.  Had to resume suction and treat with zofran.  Pt now comfortable but refusing to allow me to move him in the bed.  He does not feel he can get through CT tonight.  Dr. Brien Few notified and asked that I consult Dr. Ezzard Standing.  Dr. Biagio Quint on call for Dr. Ezzard Standing. Notified of situation and pt had no results from first enema.  He recommended I keep head of bed elevated for CT and talk to radiologist about using less contrast.  Called CT, they state head can be elevated and Dr. Eulah Citizen need to talk to radiologist about altering amount of contrast.  Will notify Dr.Oti and await his decision.  DR. Brien Few spoke with Radiologist and okay to give 200cc of contrast.  200cc given over 2 hours per CT instructions and pt to go to CT at 2000hrs.  Will monitor for nausea.

## 2012-04-29 NOTE — Progress Notes (Signed)
ANTIBIOTIC CONSULT NOTE - FOLLOW UP  Pharmacy Consult for zosyn Indication: peritonitis  Allergies  Allergen Reactions  . Allopurinol     REACTION: decreased platelets  . Aspirin     REACTION: unspecified    Patient Measurements: Height: 5\' 11"  (180.3 cm) Weight: 110 lb 0.2 oz (49.9 kg) IBW/kg (Calculated) : 75.3   Vital Signs: Temp: 97.4 F (36.3 C) (08/08 0700) Temp src: Oral (08/08 0700) BP: 139/76 mmHg (08/08 0700) Pulse Rate: 85  (08/08 0700) Intake/Output from previous day: 08/07 0701 - 08/08 0700 In: 1860 [IV Piggyback:100; TPN:1760] Out: 919  Intake/Output from this shift: Total I/O In: 80 [TPN:80] Out: -   Labs:  Basename 04/29/12 0710 04/28/12 0715 04/28/12 0237  WBC 23.6* 22.8* --  HGB 7.4* 9.2* --  PLT 139* 109* --  LABCREA -- -- --  CREATININE 4.62* 5.60* 5.21*   Estimated Creatinine Clearance: 13.2 ml/min (by C-G formula based on Cr of 4.62).   Assessment: 51 yo male with suspected peritonitis on zosyn and micafungin. WBC remains elevated with open abd wound. Leukocytosis despite broad spectrum abx and antifungal. All cultures remain neg to date.   8/1 Zosyn>> 8/2 Fluconazole>>8/3 8/3 micafungin  8/3 blood x2- pending   Plan:  -Cont zosyn 2.25g IV q8 -Micafungin 100mg  IV q24

## 2012-04-29 NOTE — Progress Notes (Signed)
Notified MD on call of results of CT scan of abdomen, completed report also available for reviewing

## 2012-04-29 NOTE — Progress Notes (Addendum)
[facility] PROGRESS NOTE  [name] AOZ:[id number] DOB: 1960/12/28 DOA: 04/02/2012 PCP: Trevor Iha, MD  Brief narrative: 51 y/o male with ESRD and Prostatectomy in 2012. One day prior to admission he started having abdominal pain which progressively got worse - mainly periumbilical nothing made it better or worse had no radiation.  He started vomiting about 3 or 4 times and decided to come to the emergency room.  In the emergency room a CT scan of the abdomen and pelvis was done that showed small bowel obstruction.On 7/24 he underwent lysis of adhesions and partial small bowel resection (iliectomy and partial jejunectomy).Unfortunately all 7//25 he developed a clot in his L femoral AV graft requiring thrombectomy. Unfortunately small bowel obstruction/ileus symptoms persisted and he subsequently went back to OR on 8/2 for what was found to be a 3cm anastomotic leak with peritonitis. Because of anticipated hemodynamic instability in a malnourished immunocompromised renal patient he was sent to the step down unit for further evaluation and monitoring.  Assessment/Plan:  SBO (small bowel obstruction) s/p s/p lysis of adhesions and partial small bowel resection 7/24 *Subsequent anastomosis dehiscence / SBO s/p lysis of adhesions, small bowel resection, omental patch of anastomosis 8/2 *Cont TNA via right femoral Vas-Cath port per surgery.  *Unclear when can begin even low flow rate feeding such as postpyloric tube feedings but will defer to surgery. May need to obtain contrasted CT scan to make sure no new anastomotic leak after second surgery. 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 *An attempt was made on 04/21/2012 to place a central line in interventional radiology under  fluoroscopy but unfortunately central venous access could not be placed due to upper extremity and internal jugular vein access being chronically occluded bilaterally therefore rationale for why we unfortunately have to continue to use this patient's right femoral Vas-Cath port for parenteral nutrition  Ileus following gastrointestinal surgery *As above - anticipated protracted postoperative course-repeating KUB today **surgery attempting to clamp NG tube today and monitoring for increasing abdominal pain or nausea  Suspected peritonitis secondary to anastomotic dehiscence/progressive leukocytosis *Cont zosyn and micafungin *Surgery following wound *On 04/28/2012 white count has increased from 14,500 to 22,800 today remains elevated at 23,600. Surgery considering CT scan to rule out abscess or anastomotic leak if white count remains elevated by 04/30/2012 *Chest x-ray completed 04/28/2012 shows no evidence of infection but slightly enlarging left pleural effusion; blood cultures obtained 04/28/2012 currently show no growth. Previous blood cultures from 04/24/2012 also showed no growth  Constipation with fecal impaction *Surgeon attempted to disimpact today and enemas order *If needs to undergo CT scan oral contrast will assist with dislodgment of retained feces throughout the colonic tract  Atypical chest pain/abnormal cardiac enzyme *Had severe chest pain Saturday night and CT of the chest revealed no evidence of pulmonary embolism or aortic dissection *He continues with burning chest pain that is likely related to NG tube although need to watch closely since CPK-MB and troponins have been steadily increasing since admission and could be related to demand ischemia versus recent stressors of recurrent surgical procedures *Multiple EKGs have been performed since admission including during episode of severe pain on Saturday evening and showed no definitive ischemic changes  CKD (chronic kidney  disease) stage V requiring chronic dialysis *Dialysis per nephrology  Hypokalemia/hypophosphatemia *Management per nephrology service  Thrombocytopenia *Recurrent this admission. Onset during initial portion of admission but resolved  *Suspect recurrence is related to recent peritonitis although unclear as to why progressive. May be related to parenteral nutrition although occult infectious process not completely ruled out-see above  Anemia  *Due to acute blood loss and due to chronic disease *Receive packed red blood cells on 04/25/2012 as ordered per surgery-hemaglobin has increased from 7 to 10.2 immediately postop and today is stable at 9.2 *Cont aranesp and ferric gluconate  HTN (hypertension) *Added IV Hydralazine to Clonidine patch 04/25/2012.  *Continue metoprolol IV.   Tachycardia *Occurred 04/27/2012 during hemodialysis and nephrologist felt was secondary to anxiety as well as increased blood flow rate-symptoms improved with decreasing blood flow rate during dialysis and the addition of when necessary Lopressor  Chronic use of steroids *Have tapered to Solu-Cortef 50 mg IV daily but will not not discontinue IV steroids until able to tolerate oral steroids *At home on Deltasone 5 mg daily  Malnutrition, calorie *Patient cachectic at presentation  *Now with acute malnutrition related to postoperative ileus and surgical complications *Parenteral nutrition managed by surgery and pharmacy *Complains of sacral and buttock pain- begin mattress overlay and chair pad  Leg dialysis graft occlusion *s/p thrombectomy 7/25 - unfortunately re clotted-has undergone an additional thrombectomy procedure with revision of arterial and graft on 04/27/2012 *Currently being dialyzed via groin HD catheter- VVS following  Acute respiratory failure with hypoxia- post op-  *reveals emphysematous changes- stable currently  DVT prophylaxis: SCDs Code Status: full  Disposition Plan: Remain in  step down- LTAC bed not available today and given changes with progressive leukocytosis and increasing abdominal pain may not be ready for transfer out to LTAC as of yet  Consultants:  General Surgery   Kidney  Events since admission: 7/13 CT Abdomen with SBO  7/24 Ex-lap, lysis of adhesions, partial small bowel resection x2  7/25 Thrombectomy L femoral AV graft  7/26 Extubated, CVL is out  7/27 Transferred to telemetry under [facility]  8/2 Acute abdomen, dehiscence of anastomosis, SBO, back from OR intubated in ICU on PCCM service. Found to have moderate peritonitis infraumbilically 8/2 Extubated  Antibiotics: Zosyn 8/1 >>> Micafungin 8/3 >> Invanz 7/24- periop Fluconazole- 8/2- one dose  HPI/Subjective: Alert and complains of periwound abdominal pain especially in the right upper quadrant as well as significant discomfort and sacral and buttock regions. Currently denies chest pain. Endorses feels full and tries to burp and unable. NG tube is clamped after return from hemodialysis and unclear if NG tube was placed to suction during hemodialysis.  Objective: Filed Vitals:   04/29/12 1055 04/29/12 1100 04/29/12 1115 04/29/12 1130  BP: 100/54 109/52 107/55 111/59  Pulse: 105 110 107 108  Temp:   97.8 F (36.6 C)   TempSrc:      Resp:  17 16 16   Height:      Weight:      SpO2:        Intake/Output Summary (Last 24 hours) at 04/29/12 1139 Last data filed at 04/29/12 0908  Gross per 24 hour  Intake   1690 ml  Output      0 ml  Net   1690 ml    Exam:   General:  Alert male, cachectic-appearing, in no acute distress  Cardiovascular: Accelerated Junctional rhythm versus sinus tachycardia, S1-S2, peripheral pulses palpable, no peripheral edema  Respiratory: CTA anteriorly, nasal cannula oxygen at 3 L per minute  Abdomen: Midline dressing in place- bs negative, non-distended but  is tender around the wound, NG tube clamped with green bilious returns, parenteral  nutrition infused  Musculoskeletal: Very thin appearing but extremities are symmetrical except for focal area of swelling on right arm between elbow and wrist which apparently is related to recent IV infiltration; otherwise nontraumatic without cyanosis or clubbing  Neurological: Patient is alert and oriented, able to express frustrations regarding current clinical status and hospitalization, exam nonfocal  Data Reviewed: Basic Metabolic Panel:  Lab 04/29/12 1610 04/28/12 0715 04/28/12 9604 04/27/12 0525 04/26/12 0711 04/24/12 2045 04/24/12 0411 04/23/12 0430  NA 131* 130* 129* 126* 129* -- -- --  K 3.4* 3.1* 4.1 3.7 3.8 -- -- --  CL 96 95* 95* 92* 94* -- -- --  CO2 22 20 18* 16* 17* -- -- --  GLUCOSE 137* 106* 93 179* 166* -- -- --  BUN 60* 74* 69* 87* 85* -- -- --  CREATININE 4.62* 5.60* 5.21* 6.16* 6.17* -- -- --  CALCIUM 9.1 9.4 9.4 9.5 9.5 -- -- --  MG 1.6 -- -- -- 1.9 1.8 2.0 1.9  PHOS 2.2* 1.0* -- -- 3.0 -- 5.2* 4.3   Liver Function Tests:  Lab 04/29/12 0710 04/28/12 0715 04/26/12 0711 04/24/12 0411  AST 24 -- 16 25  ALT 14 -- 10 9  ALKPHOS 70 -- 60 55  BILITOT 1.2 -- 1.4* 1.2  PROT 4.8* -- 5.7* 5.4*  ALBUMIN 1.4* 1.5* 1.7* 1.8*   CBC:  Lab 04/29/12 0710 04/28/12 0715 04/26/12 0711 04/25/12 2058 04/25/12 0845  WBC 23.6* 22.8* 14.5* 15.4* 13.2*  NEUTROABS -- -- 12.3* -- --  HGB 7.4* 9.2* 10.2* 9.6* 7.0*  HCT 21.8* 26.1* 29.0* 27.5* 20.5*  MCV 81.3 80.8 80.6 82.1 83.0  PLT 139* 109* 129* 106* 113*   Cardiac Enzymes:  Lab 04/28/12 1100 04/28/12 0236 04/27/12 2130 04/27/12 1300 04/27/12 0525  CKTOTAL 46 32 22 24 23   CKMB 4.1* 4.8* 5.4* 7.1* 4.7*  CKMBINDEX -- -- -- -- --  TROPONINI 1.91* 2.14* 1.49* 2.32* 1.27*   CBG:  Lab 04/29/12 0826 04/29/12 0339 04/29/12 0008 04/28/12 2327 04/28/12 2006  GLUCAP 123* 151* 161* 148* 179*    Recent Results (from the past 240 hour(s))  CULTURE, BLOOD (ROUTINE X 2)     Status: Normal (Preliminary result)   Collection Time     04/24/12 11:35 PM      Component Value Range Status Comment   Specimen Description BLOOD LEFT ARM   Final    Special Requests BOTTLES DRAWN AEROBIC ONLY 5CC   Final    Culture  Setup Time 04/25/2012 11:02   Final    Culture     Final    Value:        BLOOD CULTURE RECEIVED NO GROWTH TO DATE CULTURE WILL BE HELD FOR 5 DAYS BEFORE ISSUING A FINAL NEGATIVE REPORT   Report Status PENDING   Incomplete   CULTURE, BLOOD (ROUTINE X 2)     Status: Normal (Preliminary result)   Collection Time   04/24/12 11:40 PM      Component Value Range Status Comment   Specimen Description BLOOD LEFT HAND   Final    Special Requests BOTTLES DRAWN AEROBIC ONLY 5CC   Final    Culture  Setup Time 04/25/2012 11:02   Final    Culture     Final    Value:        BLOOD CULTURE RECEIVED NO GROWTH TO DATE CULTURE WILL BE HELD FOR 5 DAYS  BEFORE ISSUING A FINAL NEGATIVE REPORT   Report Status PENDING   Incomplete   CULTURE, BLOOD (ROUTINE X 2)     Status: Normal (Preliminary result)   Collection Time   04/28/12  3:38 PM      Component Value Range Status Comment   Specimen Description BLOOD RIGHT HAND   Final    Special Requests BOTTLES DRAWN AEROBIC ONLY 5CC   Final    Culture  Setup Time 04/28/2012 22:13   Final    Culture     Final    Value:        BLOOD CULTURE RECEIVED NO GROWTH TO DATE CULTURE WILL BE HELD FOR 5 DAYS BEFORE ISSUING A FINAL NEGATIVE REPORT   Report Status PENDING   Incomplete   CULTURE, BLOOD (ROUTINE X 2)     Status: Normal (Preliminary result)   Collection Time   04/28/12  3:48 PM      Component Value Range Status Comment   Specimen Description BLOOD RIGHT ARM   Final    Special Requests BOTTLES DRAWN AEROBIC ONLY 5CC   Final    Culture  Setup Time 04/28/2012 22:12   Final    Culture     Final    Value:        BLOOD CULTURE RECEIVED NO GROWTH TO DATE CULTURE WILL BE HELD FOR 5 DAYS BEFORE ISSUING A FINAL NEGATIVE REPORT   Report Status PENDING   Incomplete     Scheduled Meds:  reviewed Continuous Infusions: reviewed _________________________________________________________________  Russella Dar ANP [facility] Pager [phone] If 8PM-8AM, please contact night-coverage at [email or web address], password [facility] 04/29/2012, 11:39 AM  LOS: 27 days    Addendum:  I have reviewed clinical data in detail, and personally examined patient. Have also discussed with PA, and concur with assessment and plan.   1. Have discussed with  follow-up abdominal imaging to surgical team. No objections. Have ordered. 2. As leukocytosis persisting/increasing on Zosyn/Mycafungin, have consulted Dr Daiva Eves, ID.He will see patient on 04/30/12. 3. Change Solu-Cortef to 25 mg q12h (more physiological).   C. Padme Arriaga. MD, FACP.

## 2012-04-29 NOTE — Progress Notes (Addendum)
General Surgery Note  LOS: 27 days  POD# 15 and 7 Room - 2602  Assessment/Plan: 1.  EXPLORATORY LAPAROTOMY - LOA and SBR x 2 - T. Cornett - 04/14/2012   Re-explore for leak - S. Gross - 04/22/2012  Open wound - this looks a little better.  It is getting hydrotherapy.  Dressing changes TID.   On Zosyn.  WBC - 23,600 - 8/8/20132  He has made very little progress since I have seen him.  His elevated WBC is of concern, but he has no clinical evidence of an acute abdomen.  Will try clamping NGT.  2. CKD (chronic kidney disease) stage V requiring chronic dialysis  Dialysis - T/T/Sat - for dialysis today.  3. Thrombocytopenia  Plt - 139,000 - 04/29/2012 4. Gout  5.  HTN (hypertension)  6.  Chronic use of steroids  7.  Malnutrition, severe - Alb - 1.7 - 04/26/2012, Prealb - 7.7 - 04/26/2012  On TPN 8.  Leg graft occlusion   Unclotted - C. Dickson - 04/27/2012 9.  Anemia   Hgb - 7.4 - 04/29/2012 10.  Impacted - ordered enemas for a couple of days.  Subjective:  He says he feels better today.  He says he was really washed out yesterday.  No flatus or BM.  Objective:   Filed Vitals:   04/29/12 0700  BP: 139/76  Pulse: 85  Temp: 97.4 F (36.3 C)  Resp: 19     Intake/Output from previous day:  08/07 0701 - 08/08 0700 In: 1730 [IV Piggyback:50; TPN:1680] Out: 919   Intake/Output this shift:      Physical Exam:   General: Thin AA M who is alert and oriented.    HEENT: Normal. Pupils equal. .   Lungs: Clear   Abdomen: Soft, but quiet.  No localized tenderness.  Has HD cath in right groin (this limits mobility)   Wound: Looks a little better.   Rectal: Impacted with inspissated stool.   Psychiatric: Has normal mood and affect.    Lab Results:     Basename 04/29/12 0710 04/28/12 0715  WBC PENDING 22.8*  HGB 7.4* 9.2*  HCT 21.8* 26.1*  PLT PENDING 109*    BMET    Basename 04/28/12 0715 04/28/12 0237  NA 130* 129*  K 3.1* 4.1  CL 95* 95*  CO2 20 18*  GLUCOSE 106* 93  BUN  74* 69*  CREATININE 5.60* 5.21*  CALCIUM 9.4 9.4    PT/INR  No results found for this basename: LABPROT:2,INR:2 in the last 72 hours  ABG  No results found for this basename: PHART:2,PCO2:2,PO2:2,HCO3:2 in the last 72 hours   Studies/Results:  Dg Chest Port 1 View  04/28/2012  *RADIOLOGY REPORT*  Clinical Data: Progressive leukocytosis.  PORTABLE CHEST - 1 VIEW  Comparison: Chest x-ray dated 04/23/2012 and chest CT dated 04/25/2012  Findings: There is a persistent moderate left effusion with compressive atelectasis in the left lower lobe.  Heart size and vascularity are normal.  Right lung is clear.  NG tube tip is below the diaphragm.  IMPRESSION: Progressive left pleural effusion and left lower lobe atelectasis.  Original Report Authenticated By: Gwynn Burly, M.D.   Dg Abd Portable 1v  04/28/2012  *RADIOLOGY REPORT*  Clinical Data: Abdominal pain.  Ileus.  Progressive leukocytosis.  PORTABLE ABDOMEN - 1 VIEW  Comparison: Radiographs dated 04/20/2012  Findings: NG tube tip is in the body of the stomach.  There are no dilated loops of bowel.  Double-lumen catheter  is in the right femoral vein with the tips in the inferior vena cava.  Contrast is seen in the gallbladder.  There is also scattered high density material in the bowel.  IMPRESSION: No acute abnormalities.  Original Report Authenticated By: Gwynn Burly, M.D.     Anti-infectives:   Anti-infectives     Start     Dose/Rate Route Frequency Ordered Stop   04/27/12 0000   ceFAZolin (ANCEF) IVPB 1 g/50 mL premix  Status:  Discontinued     Comments: Send with pt to OR      1 g 100 mL/hr over 30 Minutes Intravenous On call 04/26/12 0927 04/26/12 0928   04/24/12 1000   micafungin (MYCAMINE) 100 mg in sodium chloride 0.9 % 100 mL IVPB        100 mg 100 mL/hr over 1 Hours Intravenous Daily 04/24/12 0750     04/23/12 1600   fluconazole (DIFLUCAN) IVPB 400 mg        400 mg 200 mL/hr over 60 Minutes Intravenous  Once 04/23/12 1428  04/23/12 1800   04/22/12 2200   piperacillin-tazobactam (ZOSYN) IVPB 3.375 g  Status:  Discontinued        3.375 g 100 mL/hr over 30 Minutes Intravenous 3 times per day 04/22/12 2120 04/22/12 2137   04/22/12 2200  piperacillin-tazobactam (ZOSYN) IVPB 2.25 g       2.25 g 100 mL/hr over 30 Minutes Intravenous 3 times per day 04/22/12 2140     04/22/12 2157   gentamicin (GARAMYCIN) injection  Status:  Discontinued          As needed 04/22/12 2158 04/22/12 2335   04/22/12 2156   clindamycin (CLEOCIN) injection  Status:  Discontinued          As needed 04/22/12 2157 04/22/12 2335   04/22/12 2145  piperacillin-tazobactam (ZOSYN) IVPB 3.375 g       3.375 g 12.5 mL/hr over 240 Minutes Intravenous  Once 04/22/12 2141 04/23/12 0145   04/22/12 2130   gentamicin (GARAMYCIN) 240 mg, clindamycin (CLEOCIN) 900 mg in sodium chloride irrigation 0.9 % 1,000 mL irrigation  Status:  Discontinued         Irrigation  Once 04/22/12 2119 04/23/12 0020   04/14/12 0830   ertapenem (INVANZ) 1 g in sodium chloride 0.9 % 50 mL IVPB        1 g 100 mL/hr over 30 Minutes Intravenous To Surgery 04/14/12 0816 04/14/12 0834   04/13/12 1359   ertapenem (INVANZ) 1 g in sodium chloride 0.9 % 50 mL IVPB  Status:  Discontinued        1 g 100 mL/hr over 30 Minutes Intravenous 60 min pre-op 04/13/12 1359 04/14/12 0816          Ovidio Kin, MD, FACS Pager: [phone],   [facility] Office: [phone] 04/29/2012

## 2012-04-29 NOTE — Progress Notes (Signed)
ANTIBIOTIC CONSULT NOTE - FOLLOW UP  Pharmacy Consult for vancomycin Indication: Intra-abdominal abscess  Allergies  Allergen Reactions  . Allopurinol     REACTION: decreased platelets  . Aspirin     REACTION: unspecified    Patient Measurements: Height: 5\' 11"  (180.3 cm) Weight: 144 lb 10 oz (65.6 kg) IBW/kg (Calculated) : 75.3   Vital Signs: Temp: 97.8 F (36.6 C) (08/08 2125) Temp src: Oral (08/08 2125) BP: 151/71 mmHg (08/08 2125) Pulse Rate: 90  (08/08 2125) Intake/Output from previous day: 08/07 0701 - 08/08 0700 In: 1860 [IV Piggyback:100; TPN:1760] Out: 919  Intake/Output from this shift: Total I/O In: 302 [IV Piggyback:92; TPN:210] Out: -   Labs:  Basename 04/29/12 2051 04/29/12 0710 04/28/12 0715 04/28/12 0237  WBC 20.6* 23.6* 22.8* --  HGB 7.4* 7.4* 9.2* --  PLT 133* 139* 109* --  LABCREA -- -- -- --  CREATININE -- 4.62* 5.60* 5.21*   Estimated Creatinine Clearance: 17.4 ml/min (by C-G formula based on Cr of 4.62). No results found for this basename: VANCOTROUGH:2,VANCOPEAK:2,VANCORANDOM:2,GENTTROUGH:2,GENTPEAK:2,GENTRANDOM:2,TOBRATROUGH:2,TOBRAPEAK:2,TOBRARND:2,AMIKACINPEAK:2,AMIKACINTROU:2,AMIKACIN:2, in the last 72 hours   Microbiology: Recent Results (from the past 720 hour(s))  MRSA PCR SCREENING     Status: Normal   Collection Time   04/03/12 10:10 AM      Component Value Range Status Comment   MRSA by PCR NEGATIVE  NEGATIVE Final   CULTURE, BLOOD (ROUTINE X 2)     Status: Normal (Preliminary result)   Collection Time   04/24/12 11:35 PM      Component Value Range Status Comment   Specimen Description BLOOD LEFT ARM   Final    Special Requests BOTTLES DRAWN AEROBIC ONLY 5CC   Final    Culture  Setup Time 04/25/2012 11:02   Final    Culture     Final    Value:        BLOOD CULTURE RECEIVED NO GROWTH TO DATE CULTURE WILL BE HELD FOR 5 DAYS BEFORE ISSUING A FINAL NEGATIVE REPORT   Report Status PENDING   Incomplete   CULTURE, BLOOD (ROUTINE X  2)     Status: Normal (Preliminary result)   Collection Time   04/24/12 11:40 PM      Component Value Range Status Comment   Specimen Description BLOOD LEFT HAND   Final    Special Requests BOTTLES DRAWN AEROBIC ONLY 5CC   Final    Culture  Setup Time 04/25/2012 11:02   Final    Culture     Final    Value:        BLOOD CULTURE RECEIVED NO GROWTH TO DATE CULTURE WILL BE HELD FOR 5 DAYS BEFORE ISSUING A FINAL NEGATIVE REPORT   Report Status PENDING   Incomplete   CULTURE, BLOOD (ROUTINE X 2)     Status: Normal (Preliminary result)   Collection Time   04/28/12  3:38 PM      Component Value Range Status Comment   Specimen Description BLOOD RIGHT HAND   Final    Special Requests BOTTLES DRAWN AEROBIC ONLY 5CC   Final    Culture  Setup Time 04/28/2012 22:13   Final    Culture     Final    Value:        BLOOD CULTURE RECEIVED NO GROWTH TO DATE CULTURE WILL BE HELD FOR 5 DAYS BEFORE ISSUING A FINAL NEGATIVE REPORT   Report Status PENDING   Incomplete   CULTURE, BLOOD (ROUTINE X 2)     Status: Normal (  Preliminary result)   Collection Time   04/28/12  3:48 PM      Component Value Range Status Comment   Specimen Description BLOOD RIGHT ARM   Final    Special Requests BOTTLES DRAWN AEROBIC ONLY 5CC   Final    Culture  Setup Time 04/28/2012 22:12   Final    Culture     Final    Value:        BLOOD CULTURE RECEIVED NO GROWTH TO DATE CULTURE WILL BE HELD FOR 5 DAYS BEFORE ISSUING A FINAL NEGATIVE REPORT   Report Status PENDING   Incomplete     Anti-infectives     Start     Dose/Rate Route Frequency Ordered Stop   04/27/12 0000   ceFAZolin (ANCEF) IVPB 1 g/50 mL premix  Status:  Discontinued     Comments: Send with pt to OR      1 g 100 mL/hr over 30 Minutes Intravenous On call 04/26/12 0927 04/26/12 0928   04/24/12 1000   micafungin (MYCAMINE) 100 mg in sodium chloride 0.9 % 100 mL IVPB        100 mg 100 mL/hr over 1 Hours Intravenous Daily 04/24/12 0750     04/23/12 1600   fluconazole  (DIFLUCAN) IVPB 400 mg        400 mg 200 mL/hr over 60 Minutes Intravenous  Once 04/23/12 1428 04/23/12 1800   04/22/12 2200   piperacillin-tazobactam (ZOSYN) IVPB 3.375 g  Status:  Discontinued        3.375 g 100 mL/hr over 30 Minutes Intravenous 3 times per day 04/22/12 2120 04/22/12 2137   04/22/12 2200  piperacillin-tazobactam (ZOSYN) IVPB 2.25 g       2.25 g 100 mL/hr over 30 Minutes Intravenous 3 times per day 04/22/12 2140     04/22/12 2157   gentamicin (GARAMYCIN) injection  Status:  Discontinued          As needed 04/22/12 2158 04/22/12 2335   04/22/12 2156   clindamycin (CLEOCIN) injection  Status:  Discontinued          As needed 04/22/12 2157 04/22/12 2335   04/22/12 2145  piperacillin-tazobactam (ZOSYN) IVPB 3.375 g       3.375 g 12.5 mL/hr over 240 Minutes Intravenous  Once 04/22/12 2141 04/23/12 0145   04/22/12 2130   gentamicin (GARAMYCIN) 240 mg, clindamycin (CLEOCIN) 900 mg in sodium chloride irrigation 0.9 % 1,000 mL irrigation  Status:  Discontinued         Irrigation  Once 04/22/12 2119 04/23/12 0020   04/14/12 0830   ertapenem (INVANZ) 1 g in sodium chloride 0.9 % 50 mL IVPB        1 g 100 mL/hr over 30 Minutes Intravenous To Surgery 04/14/12 0816 04/14/12 0834   04/13/12 1359   ertapenem (INVANZ) 1 g in sodium chloride 0.9 % 50 mL IVPB  Status:  Discontinued        1 g 100 mL/hr over 30 Minutes Intravenous 60 min pre-op 04/13/12 1359 04/14/12 0816          Assessment: 51 yo male with ESRD with suspected peritonitis on zosyn and micafungin. CT abdomen/pelvis from today (8/8) showed multiple loculated fluid collections consistent with abscesses. Pharmacy consulted to manage vancomycin now.   8/1 Zosyn>> 8/2 Fluconazole>>8/3 8/3 micafungin  Goal of Therapy:  Pre-HD vancomycin level 15 - 25 mcg/mL  Plan:  1. Vancomycin 1500mg  IV X 1.  2. Follow-up HD plan and HD tolerance  for further vancomycin dosing.   Emeline Gins 04/29/2012,10:50 PM

## 2012-04-29 NOTE — Progress Notes (Signed)
[facility] Progress Note  Subjective: Quiet this AM but appears more comfortable than I have seen him lately. Still no flatus but he says pain is improved  Objective Filed Vitals:   04/28/12 2300 04/29/12 0000 04/29/12 0300 04/29/12 0341  BP: 142/51  139/66   Pulse: 102  87   Temp: 98.6 F (37 C) 98.6 F (37 C) 98 F (36.7 C) 98 F (36.7 C)  TempSrc: Oral Oral Oral Axillary  Resp: 15  16   Height:      Weight:      SpO2: 99%  100%    Physical Exam General: seems more comfortable Heart: RRR Lungs: mostly clear Abdomen: distended, slightly painful.  I can appreciate bowel sounds Extremities: no peripheral edema Dialysis Access: left thigh AVGG patent; dressing intact with scant bleeding.  Dialysis Orders: Center: [facility] on TTS.  EDW 52.5 kg HD Bath 2K/2Ca Time 3.5 hrs Heparin 5000 U. Access AVG @ left thigh BFR 400 DFR 800 Zemplar 0 mcg IV/HD Epogen 0 Units IV/HD Venofer 50/week; last iPTH 314, but product high and no zemplar  Assessment/Plan:  1. SBO, s/p exp lap/SB resection 7/24 Dr. Luisa Hart, c/w dehiscence with peritonitis, back to OR 8/1 for further SB resection and reanastomosis. On IV zosyn and mycofungin.   Still with ileus/NGT per surgery. Repeat imaging being considered 2. ESRD - HD normally  TTS at [facility]. temp R groin cath still in place, they are using for TPN , thigh graft s/p declot is functioning ok.  HD daily for a while to keep up with volume, patient is only tolerating for just over 2 hours at a time lately.  4 K bath. 3.  Hypertension/volume - was on no BP meds at home. BP has been variable here.  I stopped clonidine patch yesterday and hemodynamics seem improved.  Keep metoprolol given sinus tach.   Steroids being weaned. Daily HD should keep in check.  Is hyponatremic due to volume overload, HD will help. Plan for HD later today.  4. Anemia of CKD - Getting max Aranesp.  < 10; on full course of IV Fe; ferritin 552. Hb over 9 after transfusion over  weekend 5. Metabolic bone disease - No binders due to low phos being repleted.  Will check again today and replete as needed 6. Protein calorie malnutrition- Pre-albumin very low; on TNA 70cc/hr (1680 = any other fluids) today. Getting lipids MWF due to shortage; getting phos and K repletion today 7. S/p renal transplant - at [facility] in '87, started HD in 04/2003, remains on Prednisone.       9. Hepatitis C/thrombocytopenia - stable    Ennis Delpozo A  04/29/2012      Additional Objective Labs: Basic Metabolic Panel:  Lab 04/28/12 4098 04/28/12 0237 04/27/12 0525 04/26/12 0711 04/24/12 0411  NA 130* 129* 126* -- --  K 3.1* 4.1 3.7 -- --  CL 95* 95* 92* -- --  CO2 20 18* 16* -- --  GLUCOSE 106* 93 179* -- --  BUN 74* 69* 87* -- --  CREATININE 5.60* 5.21* 6.16* -- --  CALCIUM 9.4 9.4 9.5 -- --  ALB -- -- -- -- --  PHOS 1.0* -- -- 3.0 5.2*   Liver Function Tests:  Lab 04/28/12 0715 04/26/12 0711 04/24/12 0411  AST -- 16 25  ALT -- 10 9  ALKPHOS -- 60 55  BILITOT -- 1.4* 1.2  PROT -- 5.7* 5.4*  ALBUMIN 1.5* 1.7* 1.8*   CBC:  Lab 04/28/12  0715 04/26/12 0711 04/25/12 2058 04/25/12 0845 04/24/12 2045  WBC 22.8* 14.5* 15.4* -- --  NEUTROABS -- 12.3* -- -- --  HGB 9.2* 10.2* 9.6* -- --  HCT 26.1* 29.0* 27.5* -- --  MCV 80.8 80.6 82.1 83.0 83.4  PLT 109* 129* 106* -- --  Blood Culture    Component Value Date/Time   SDES BLOOD LEFT HAND 04/24/2012 2340   SPECREQUEST BOTTLES DRAWN AEROBIC ONLY 5CC 04/24/2012 2340   CULT        BLOOD CULTURE RECEIVED NO GROWTH TO DATE CULTURE WILL BE HELD FOR 5 DAYS BEFORE ISSUING A FINAL NEGATIVE REPORT 04/24/2012 2340   REPTSTATUS PENDING 04/24/2012 2340  Cardiac Enzymes:  Lab 04/28/12 1100 04/28/12 0236 04/27/12 2130 04/27/12 1300 04/27/12 0525  CKTOTAL 46 32 22 24 23   CKMB 4.1* 4.8* 5.4* 7.1* 4.7*  CKMBINDEX -- -- -- -- --  TROPONINI 1.91* 2.14* 1.49* 2.32* 1.27*  CBG:  Lab 04/29/12 0339 04/29/12 0008 04/28/12 2006 04/28/12 1649  04/28/12 1642  GLUCAP 151* 161* 179* 131* 175*  Medications:    . fat emulsion 240 mL (04/28/12 1806)  . TPN (CLINIMIX) +/- additives 70 mL/hr at 04/28/12 1806  . DISCONTD: TPN (CLINIMIX) +/- additives 70 mL/hr at 04/27/12 1757      . antiseptic oral rinse  15 mL Mouth Rinse q12n4p  . darbepoetin (ARANESP) injection - DIALYSIS  200 mcg Intravenous Q Sat-HD  . fentaNYL  25 mcg Transdermal Q72H  . ferric gluconate (FERRLECIT/NULECIT) IV  125 mg Intravenous Q T,Th,Sa-HD  . hydrocortisone sodium succinate  50 mg Intravenous Daily  . insulin aspart  0-15 Units Subcutaneous Q4H  . lip balm   Topical BID  . metoprolol  5 mg Intravenous Q6H  . micafungin (MYCAMINE) IV  100 mg Intravenous Daily  . morphine      . pantoprazole (PROTONIX) IV  40 mg Intravenous Q24H  . piperacillin-tazobactam (ZOSYN)  IV  2.25 g Intravenous Q8H  . potassium chloride  10 mEq Intravenous Q1 Hr x 2  . potassium chloride      . potassium chloride      . potassium phosphate IVPB (mmol)  15 mmol Intravenous Once  . sodium chloride  10-40 mL Intracatheter Q12H  . DISCONTD: cloNIDine  0.2 mg Transdermal Weekly

## 2012-04-29 NOTE — Progress Notes (Signed)
Hydrotherapy Note:   04/29/12 1406  Subjective Assessment  Subjective "We can only do what we can."  Patient and Family Stated Goals Get well.  Date of Onset 04/02/12  Prior Treatments Dressing changes  Evaluation and Treatment  Evaluation and Treatment Procedures Explained to Patient/Family Yes  Evaluation and Treatment Procedures agreed to  Wound 04/27/12 Other (Comment) Abdomen Mid Midline abdominal incisional wound  Date First Assessed/Time First Assessed: 04/27/12 1326   Wound Type: Other (Comment)  Location: Abdomen  Location Orientation: Mid  Wound Description (Comments): Midline abdominal incisional wound  Site / Wound Assessment Black;Red;Yellow  % Wound base Red or Granulating 40%  % Wound base Yellow 50%  % Wound base Black 10%  Peri-wound Assessment Intact  Margins Unattacted edges (unapproximated)  Closure None  Drainage Amount Minimal  Drainage Description Serosanguineous;No odor  Non-staged Wound Description Partial thickness  Treatment Hydrotherapy (Pulse lavage);Debridement (Selective);Packing (Saline gauze) (NS W to D 4x4, 4x4 dry, abd, skin prep, tape)  Dressing Type Moist to dry;Gauze (Comment);ABD (NS W to D 4x4, 4x4 dry, abd, skin prep, tape)  Dressing Changed New  Dressing Status Clean  Hydrotherapy  Pulsed Lavage with Suction (psi) 8 psi  Pulsed Lavage with Suction - Normal Saline Used 1000 mL  Pulsed Lavage Tip Tip with splash shield  Pulsed lavage therapy - wound location Abdomen  Selective Debridement  Selective Debridement - Location Abdomen  Selective Debridement - Tools Used Forceps;Scissors  Selective Debridement - Tissue Removed Yellow and black slough.  Wound Therapy - Assess/Plan/Recommendations  Wound Therapy - Clinical Statement Pt is a 51 y/o male with an abdominal midline incisional wound.  Pt would benefit from continued hydrotherapy to assist with cleansing and debridement while facilitating wound healing.  Pt with significant improvement  with debridement today with increased granulation tissue present.  Wound Therapy - Functional Problem List Decreased skin integrity causing increased chance for infection.  Factors Delaying/Impairing Wound Healing Infection - systemic/local;Immobility;Multiple medical problems;Polypharmacy  Hydrotherapy Plan Debridement;Dressing change;Patient/family education;Pulsatile lavage with suction  Wound Therapy - Frequency 6X / week  Wound Therapy - Current Recommendations PT;OT;Surgery consult;WOC nurse  Wound Therapy - Follow Up Recommendations Home health RN  Wound Plan Pulse lavage, debridement, dressing change 6x/week.  Wound Therapy Goals - Improve the function of patient's integumentary system by progressing the wound(s) through the phases of wound healing by:  Decrease Necrotic Tissue to 40  Decrease Necrotic Tissue - Progress Updated due to goal met  Increase Granulation Tissue to 60  Increase Granulation Tissue - Progress Updated due to goal met  Improve Drainage Characteristics - Progress Progressing toward goal  Goals/treatment plan/discharge plan were made with and agreed upon by patient/family Yes  Wound Therapy - Potential for Goals Good    Pain:  8/10 in abdomen.  Pt premedicated and RN aware.  04/29/2012 Cephus Shelling, PT, DPT [phone]

## 2012-04-29 NOTE — Progress Notes (Signed)
PARENTERAL NUTRITION CONSULT NOTE - FOLLOW UP  Pharmacy Consult for TPN Indication: SBO, s/p LOA/SBR/anastamosis , s/p LOA/SBR/re-anastamosis with patch repair for leak  Allergies  Allergen Reactions  . Allopurinol     REACTION: decreased platelets  . Aspirin     REACTION: unspecified    Patient Measurements: Height: 5\' 11"  (180.3 cm) Weight: 110 lb 0.2 oz (49.9 kg) IBW/kg (Calculated) : 75.3   Vital Signs: Temp: 97.4 F (36.3 C) (08/08 0700) Temp src: Oral (08/08 0700) BP: 139/76 mmHg (08/08 0700) Pulse Rate: 85  (08/08 0700) Intake/Output from previous day: 08/07 0701 - 08/08 0700 In: 1860 [IV Piggyback:100; TPN:1760] Out: 919  Intake/Output from this shift: Total I/O In: 80 [TPN:80] Out: -   Labs:  Basename 04/29/12 0710 04/28/12 0715  WBC 23.6* 22.8*  HGB 7.4* 9.2*  HCT 21.8* 26.1*  PLT 139* 109*  APTT -- --  INR -- --     Basename 04/29/12 0710 04/28/12 0715 04/28/12 0237  NA 131* 130* 129*  K 3.4* 3.1* 4.1  CL 96 95* 95*  CO2 22 20 18*  GLUCOSE 137* 106* 93  BUN 60* 74* 69*  CREATININE 4.62* 5.60* 5.21*  LABCREA -- -- --  CREAT24HRUR -- -- --  CALCIUM 9.1 9.4 9.4  MG 1.6 -- --  PHOS 2.2* 1.0* --  PROT 4.8* -- --  ALBUMIN 1.4* 1.5* --  AST 24 -- --  ALT 14 -- --  ALKPHOS 70 -- --  BILITOT 1.2 -- --  BILIDIR -- -- --  IBILI -- -- --  PREALBUMIN -- -- --  TRIG -- -- --  CHOLHDL -- -- --  CHOL -- -- --   Estimated Creatinine Clearance: 13.2 ml/min (by C-G formula based on Cr of 4.62).    Basename 04/29/12 0339 04/29/12 0008 04/28/12 2327  GLUCAP 151* 161* 148*    Insulin Requirements in the past 24 hours:  7 units Novolog. Currently on Moderate SSI q4h with 10 units insulin in TNA  Current Nutrition:  NPO, Clinimix 5/15 at 70 ml/hr with lipids at 10 ml/hr MWF only  Admit:  Admitted 7/13 with abd pain, weight loss, SBO. Presently in the OR for repeat thrombectomy of L thigh AV Graft. (s/p thrombectomy 7/25).  GI-  SBO, back to OR  8/1 for anastomotic leak- now s/p patch repair.  Has been essentially NPO since admit, therefore at significant risk for re-feeding, but has been tolerating TPN thus far. NGT output 350 as opf 86 am, none recorded since then. Endo: Moderate SSI q4, on Solu-Cortef-weaned to daily now. CBG currently at goal of <180 with insulin in TNA.   Lytes:  Na low, likely d/t overload and Clinimix without supplemented lytes. K, phos slightly low but improved s/p repletion w/KPhos yesterday.  Corrected Ca =11.8, high despite no lytes in TNA. Renal:  ESRD w/HD typically T/TH/Sat - having trouble tolerating HD - needs daily for now; Ferrlecit, Aranesp-Sat Pulm: extubated; now on RA Cards: hypertensive. Pos trop, on prn anti-HTNs; hgb down to 7.4 this am Hepatobil: LFTs, trig and chol WNL.  Prealbumin decreased - will follow. Neuro:  Morphine prn for severe pain ID: Afeb, WBC elevated. Lower abdominal peritonitis- on Mycamine and renally adjusted Zosyn Best Practices:  Mouthcare, PPI-IV, scd's ordered, pt refusing  Nutritional Goals:  1700-1950 kCal, 75-85 grams of protein per day  Plan:  - Continue Clinimix 5/15 to 70 ml/hr, which meets protein goal but is just below kcal. - Provide MVI and lipids (@10cc /hr)  on MWF only d/t national backorder. - Continue 10 units insulin in TNA - Replete K, Phos with 10 mmol KPhos - F/u am BMP, Phos  Karielle Davidow L. Illene Bolus, PharmD, BCPS Clinical Pharmacist Pager: [phone] Pharmacy: [phone] 04/29/2012 8:28 AM

## 2012-04-29 NOTE — Procedures (Signed)
Patient was seen on dialysis and the procedure was supervised.  BFR 400  Via AVG BP is  124/61.   Patient appears to be tolerating treatment well  [name] A 04/29/2012

## 2012-04-29 NOTE — Progress Notes (Signed)
Pt sent to HD via bed at 0850hrs.

## 2012-04-29 NOTE — Progress Notes (Signed)
PT Cancellation Note  Treatment cancelled today due to patient receiving procedure or test. Patient in hemodialysis.    INGOLD,Shalev Helminiak 04/29/2012, 10:16 AM  [facility] [phone] (pager)

## 2012-04-30 ENCOUNTER — Inpatient Hospital Stay (HOSPITAL_COMMUNITY): Payer: Medicare Other

## 2012-04-30 DIAGNOSIS — K651 Peritoneal abscess: Secondary | ICD-10-CM

## 2012-04-30 DIAGNOSIS — R609 Edema, unspecified: Secondary | ICD-10-CM

## 2012-04-30 LAB — GLUCOSE, CAPILLARY
Glucose-Capillary: 199 mg/dL — ABNORMAL HIGH (ref 70–99)
Glucose-Capillary: 161 mg/dL — ABNORMAL HIGH (ref 70–99)

## 2012-04-30 LAB — CBC WITH DIFFERENTIAL/PLATELET
Basophils Relative: 1 % (ref 0–1)
Eosinophils Absolute: 0 10*3/uL (ref 0.0–0.7)
HCT: 24.2 % — ABNORMAL LOW (ref 39.0–52.0)
Hemoglobin: 8.3 g/dL — ABNORMAL LOW (ref 13.0–17.0)
Lymphocytes Relative: 5 % — ABNORMAL LOW (ref 12–46)
MCHC: 34.3 g/dL (ref 30.0–36.0)
Monocytes Relative: 9 % (ref 3–12)
Neutrophils Relative %: 85 % — ABNORMAL HIGH (ref 43–77)
RBC: 2.96 MIL/uL — ABNORMAL LOW (ref 4.22–5.81)
WBC: 22.4 10*3/uL — ABNORMAL HIGH (ref 4.0–10.5)

## 2012-04-30 LAB — BASIC METABOLIC PANEL
Calcium: 8.9 mg/dL (ref 8.4–10.5)
Chloride: 94 mEq/L — ABNORMAL LOW (ref 96–112)
Creatinine, Ser: 3.64 mg/dL — ABNORMAL HIGH (ref 0.50–1.35)
GFR calc Af Amer: 21 mL/min — ABNORMAL LOW (ref >90)
Sodium: 129 mEq/L — ABNORMAL LOW (ref 135–145)

## 2012-04-30 LAB — CBC
HCT: 30.4 % — ABNORMAL LOW (ref 39.0–52.0)
Hemoglobin: 10.5 g/dL — ABNORMAL LOW (ref 13.0–17.0)
MCH: 28.8 pg (ref 26.0–34.0)
MCHC: 34.5 g/dL (ref 30.0–36.0)
MCV: 83.3 fL (ref 78.0–100.0)
Platelets: 151 10*3/uL (ref 150–400)
RBC: 3.65 MIL/uL — ABNORMAL LOW (ref 4.22–5.81)
RDW: 17.7 % — ABNORMAL HIGH (ref 11.5–15.5)
WBC: 21.5 10*3/uL — ABNORMAL HIGH (ref 4.0–10.5)

## 2012-04-30 LAB — RENAL FUNCTION PANEL
Albumin: 1.6 g/dL — ABNORMAL LOW (ref 3.5–5.2)
BUN: 35 mg/dL — ABNORMAL HIGH (ref 6–23)
CO2: 24 mEq/L (ref 19–32)
Calcium: 8.9 mg/dL (ref 8.4–10.5)
Chloride: 97 mEq/L (ref 96–112)
Creatinine, Ser: 2.79 mg/dL — ABNORMAL HIGH (ref 0.50–1.35)
GFR calc Af Amer: 28 mL/min — ABNORMAL LOW (ref >90)
GFR calc non Af Amer: 24 mL/min — ABNORMAL LOW (ref >90)
Glucose, Bld: 150 mg/dL — ABNORMAL HIGH (ref 70–99)
Phosphorus: 2.7 mg/dL (ref 2.3–4.6)
Potassium: 3.7 mEq/L (ref 3.5–5.1)
Sodium: 132 mEq/L — ABNORMAL LOW (ref 135–145)

## 2012-04-30 LAB — PHOSPHORUS: Phosphorus: 3.3 mg/dL (ref 2.3–4.6)

## 2012-04-30 LAB — PREPARE RBC (CROSSMATCH)

## 2012-04-30 MED ORDER — FENTANYL CITRATE 0.05 MG/ML IJ SOLN
INTRAMUSCULAR | Status: AC
  Filled 2012-04-30: qty 4

## 2012-04-30 MED ORDER — MIDAZOLAM HCL 5 MG/5ML IJ SOLN
INTRAMUSCULAR | Status: AC | PRN
  Administered 2012-04-30: 1.5 mg via INTRAVENOUS

## 2012-04-30 MED ORDER — SODIUM CHLORIDE 0.9 % IV SOLN
750.0000 mg | Freq: Once | INTRAVENOUS | Status: DC
  Filled 2012-04-30: qty 750

## 2012-04-30 MED ORDER — FENTANYL CITRATE 0.05 MG/ML IJ SOLN
INTRAMUSCULAR | Status: AC | PRN
  Administered 2012-04-30: 25 ug via INTRAVENOUS
  Administered 2012-04-30: 50 ug via INTRAVENOUS

## 2012-04-30 MED ORDER — MIDAZOLAM HCL 2 MG/2ML IJ SOLN
INTRAMUSCULAR | Status: AC
  Filled 2012-04-30: qty 4

## 2012-04-30 MED ORDER — FAT EMULSION 20 % IV EMUL
240.0000 mL | INTRAVENOUS | Status: AC
  Administered 2012-04-30: 240 mL via INTRAVENOUS
  Filled 2012-04-30: qty 250

## 2012-04-30 MED ORDER — ZINC TRACE METAL 1 MG/ML IV SOLN
INTRAVENOUS | Status: AC
  Administered 2012-04-30: 17:00:00 via INTRAVENOUS
  Filled 2012-04-30: qty 2000

## 2012-04-30 NOTE — Progress Notes (Signed)
Hydrotherapy Note:   04/30/12 1325  Subjective Assessment  Subjective "Go for it."  Patient and Family Stated Goals Get well.  Date of Onset 04/02/12  Prior Treatments Dressing changes  Evaluation and Treatment  Evaluation and Treatment Procedures Explained to Patient/Family Yes  Evaluation and Treatment Procedures agreed to  Wound 04/27/12 Other (Comment) Abdomen Mid Midline abdominal incisional wound  Date First Assessed/Time First Assessed: 04/27/12 1326   Wound Type: Other (Comment)  Location: Abdomen  Location Orientation: Mid  Wound Description (Comments): Midline abdominal incisional wound  Site / Wound Assessment Black;Red;Yellow  % Wound base Red or Granulating 50%  % Wound base Yellow 45%  % Wound base Black 5%  Peri-wound Assessment Intact  Margins Unattacted edges (unapproximated)  Closure Sutures  Drainage Amount Minimal  Drainage Description Serosanguineous;No odor  Non-staged Wound Description Partial thickness  Treatment Hydrotherapy (Pulse lavage);Debridement (Selective);Packing (Saline gauze) (NS W to D 4x4, 4x4 dry, abd, skin prep, tape)  Dressing Type Moist to dry;Gauze (Comment);ABD (NS W to D 4x4, 4x4 dry, abd, skin prep, tape)  Dressing Changed New  Dressing Status Clean  Hydrotherapy  Pulsed Lavage with Suction (psi) 8 psi  Pulsed Lavage with Suction - Normal Saline Used 1000 mL  Pulsed Lavage Tip Tip with splash shield  Pulsed lavage therapy - wound location Abdomen  Selective Debridement  Selective Debridement - Location Abdomen  Selective Debridement - Tools Used Forceps;Scissors  Selective Debridement - Tissue Removed Yellow and black slough.  Wound Therapy - Assess/Plan/Recommendations  Wound Therapy - Clinical Statement Pt is a 51 y/o male with an abdominal midline incisional wound.  Pt would benefit from continued hydrotherapy to assist with cleansing and debridement while facilitating wound healing.  Pt with continued improvement with debridement  today with increased granulation tissue present.  Wound Therapy - Functional Problem List Decreased skin integrity causing increased chance for infection.  Factors Delaying/Impairing Wound Healing Infection - systemic/local;Immobility;Multiple medical problems;Polypharmacy  Hydrotherapy Plan Debridement;Dressing change;Patient/family education;Pulsatile lavage with suction  Wound Therapy - Frequency 6X / week  Wound Therapy - Current Recommendations PT;OT;Surgery consult;WOC nurse  Wound Therapy - Follow Up Recommendations Home health RN  Wound Plan Pulse lavage, debridement, dressing change 6x/week.  Wound Therapy Goals - Improve the function of patient's integumentary system by progressing the wound(s) through the phases of wound healing by:  Decrease Necrotic Tissue - Progress Progressing toward goal  Increase Granulation Tissue - Progress Progressing toward goal  Improve Drainage Characteristics - Progress Progressing toward goal  Goals/treatment plan/discharge plan were made with and agreed upon by patient/family Yes  Wound Therapy - Potential for Goals Good    Pain: None noted.  Pt premedicated.  04/30/2012 Cephus Shelling, PT, DPT [phone]

## 2012-04-30 NOTE — Progress Notes (Signed)
[facility] PROGRESS NOTE  [name] XBJ:[id number] DOB: 09-04-1961 DOA: 04/02/2012 PCP: Stephen Iha, MD  Brief narrative: 51 y/o male with ESRD and Prostatectomy in 2012. One day prior to admission he started having abdominal pain which progressively got worse - mainly periumbilical nothing made it better or worse had no radiation.  He started vomiting about 3 or 4 times and decided to come to the emergency room.  In the emergency room a CT scan of the abdomen and pelvis was done that showed small bowel obstruction.On 7/24 he underwent lysis of adhesions and partial small bowel resection (iliectomy and partial jejunectomy).Unfortunately all 7//25 he developed a clot in his L femoral AV graft requiring thrombectomy. Unfortunately small bowel obstruction/ileus symptoms persisted and he subsequently went back to OR on 8/2 for what was found to be a 3cm anastomotic leak with peritonitis. Because of anticipated hemodynamic instability in a malnourished immunocompromised renal patient he was sent to the step down unit for further evaluation and monitoring.  Assessment/Plan:  SBO (small bowel obstruction) s/p s/p lysis of adhesions and partial small bowel resection 7/24 *Subsequent anastomosis dehiscence / SBO s/p lysis of adhesions, small bowel resection, omental patch of anastomosis 8/2  *CT abdomen and pelvis 04/29/2012 did demonstrate several fluid collections consistent with intra-abdominal abscess with the largest measuring 16.5 x 13.7 x 6.4 cm. 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  *As above - anticipated protracted postoperative course-repeating KUB today *Did not tolerate clamping of NG tube on 04/29/2012. Actually was only able to briefly tolerate 200 cc of CT contrast without abdominal pain and nausea  Suspected peritonitis secondary to anastomotic dehiscence/progressive leukocytosis *Cont zosyn, vanocomycin and micafungin *Surgery following wound-see above regarding multiple intra-abdominal postoperative abscess and plan percutaneous drain insertion *Infectious disease service was consulted on 04/29/2012   Constipation with fecal impaction *Surgeon attempted to disimpact 04/29/2012 but unfortunately did not tolerate enemas  Atypical chest pain/abnormal cardiac enzyme *Had episode of severe chest pain during this hospitalization. CT of the chest revealed no evidence of pulmonary embolism or aortic dissection *Suspect mildly elevated cardiac isoenzymes are related to demand ischemia versus recent stressors of recurrent surgical procedures *Multiple EKGs have been performed since admission including during episode of severe pain on Saturday evening and showed no definitive ischemic changes  CKD (chronic kidney disease) stage V requiring chronic dialysis *Dialysis per nephrology  Left lower extremity edema *Concern is for DVT so we'll check venous duplex study today *Could be related to recurrent  surgical procedures on the left thigh hemodialysis graft  Hypokalemia/hypophosphatemia *Management per nephrology service/TNA  Thrombocytopenia *Recurrent this admission x 2 episodes. *As of today platelets have  normalized   Normocytic Anemia  *Due to acute blood loss and due to chronic disease *Receive packed red blood cells on 04/25/2012 as ordered per surgery-hemaglobin has increased from 7 to 10.2 immediately postop and today is stable at 9.2 *Cont aranesp and ferric gluconate  HTN (hypertension) *Added IV Hydralazine to Clonidine patch 04/25/2012.  *Continue metoprolol IV.   Tachycardia *Occurred 04/27/2012 during hemodialysis and nephrologist felt was secondary to anxiety as well as increased blood flow rate-symptoms improved with decreasing blood flow rate during dialysis and the addition of when necessary Lopressor  Chronic use of steroids *Have tapered to Solu-Cortef 25 mg IV daily but will not not discontinue IV steroids until able to tolerate oral steroids *At home on Deltasone 5 mg daily  Malnutrition, calorie *Patient cachectic at presentation  *Now with acute malnutrition related to postoperative ileus and surgical complications *Parenteral nutrition managed by surgery and pharmacy  Leg dialysis graft occlusion *s/p thrombectomy 7/25 - unfortunately re clotted-has undergone an additional thrombectomy procedure with revision of arterial and graft on 04/27/2012 *Currently being dialyzed via groin HD catheter- VVS following  DVT prophylaxis: SCDs Code Status: full  Disposition Plan: Remain in step down  Consultants:  General Surgery  [facility]  Events since admission: 7/13 CT Abdomen with SBO  7/24 Ex-lap, lysis of adhesions, partial small bowel resection x2  7/25 Thrombectomy L femoral AV graft  7/26 Extubated, CVL is out  7/27 Transferred to telemetry under [facility]  8/2 Acute abdomen, dehiscence of anastomosis, SBO, back from OR intubated in ICU on  PCCM service. Found to have moderate peritonitis infraumbilically 8/2 Extubated  Antibiotics: Zosyn 8/1 >>> Micafungin 8/3 >> Invanz 7/24- periop Fluconazole- 8/2- one dose Vancomycin 8/8 >>>  HPI/Subjective: Alert and continues to endorse improved pain control. Aware needs percutaneous drain placement today. Still no flatus.  Objective: Filed Vitals:   04/30/12 1000 04/30/12 1013 04/30/12 1021 04/30/12 1046  BP: 132/73 140/71 144/78 130/67  Pulse: 102 105 96 91  Temp:   97.5 F (36.4 C) 98.4 F (36.9 C)  TempSrc:   Oral Oral  Resp: 14 14  14   Height:      Weight:   66.4 kg (146 lb 6.2 oz)   SpO2:   96% 100%    Intake/Output Summary (Last 24 hours) at 04/30/12 1106 Last data filed at 04/30/12 1021  Gross per 24 hour  Intake   3056 ml  Output   3496 ml  Net   -440 ml    Exam:   General:  Alert male, cachectic-appearing, in no acute distress  Cardiovascular: Sinus rhythm, S1-S2, peripheral pulses palpable, no peripheral edema  Respiratory: CTA anteriorly, room air  Abdomen: Midline dressing in place- bs negative, non-distended but is tender around the wound, NG tube clamped with green bilious returns, parenteral nutrition infused  Musculoskeletal: new finding of left lower extremity edema  Neurological: Patient is alert and oriented, able to express frustrations regarding current clinical status and hospitalization, exam nonfocal  Data Reviewed: Basic Metabolic Panel:  Lab 04/30/12 4098 04/29/12 0710 04/28/12 0715 04/28/12 0237 04/27/12 0525 04/26/12 0711 04/24/12 2045 04/24/12 0411  NA 129* 131* 130* 129* 126* -- -- --  K 3.8 3.4* 3.1* 4.1 3.7 -- -- --  CL 94* 96 95* 95* 92* -- -- --  CO2 23 22 20  18* 16* -- -- --  GLUCOSE 180* 137* 106* 93 179* -- -- --  BUN 49* 60* 74* 69* 87* -- -- --  CREATININE 3.64* 4.62*  5.60* 5.21* 6.16* -- -- --  CALCIUM 8.9 9.1 9.4 9.4 9.5 -- -- --  MG -- 1.6 -- -- -- 1.9 1.8 2.0  PHOS 3.3 2.2* 1.0* -- -- 3.0 -- 5.2*   Liver  Function Tests:  Lab 04/29/12 0710 04/28/12 0715 04/26/12 0711 04/24/12 0411  AST 24 -- 16 25  ALT 14 -- 10 9  ALKPHOS 70 -- 60 55  BILITOT 1.2 -- 1.4* 1.2  PROT 4.8* -- 5.7* 5.4*  ALBUMIN 1.4* 1.5* 1.7* 1.8*   CBC:  Lab 04/30/12 0500 04/29/12 2051 04/29/12 0710 04/28/12 0715 04/26/12 0711  WBC 22.4* 20.6* 23.6* 22.8* 14.5*  NEUTROABS 19.1* -- -- -- 12.3*  HGB 8.3* 7.4* 7.4* 9.2* 10.2*  HCT 24.2* 22.3* 21.8* 26.1* 29.0*  MCV 81.8 83.8 81.3 80.8 80.6  PLT 163 133* 139* 109* 129*   Cardiac Enzymes:  Lab 04/28/12 1100 04/28/12 0236 04/27/12 2130 04/27/12 1300 04/27/12 0525  CKTOTAL 46 32 22 24 23   CKMB 4.1* 4.8* 5.4* 7.1* 4.7*  CKMBINDEX -- -- -- -- --  TROPONINI 1.91* 2.14* 1.49* 2.32* 1.27*   CBG:  Lab 04/30/12 1044 04/30/12 0444 04/30/12 0055 04/29/12 2114 04/29/12 1630  GLUCAP 161* 141* 199* 191* 132*    Recent Results (from the past 240 hour(s))  CULTURE, BLOOD (ROUTINE X 2)     Status: Normal (Preliminary result)   Collection Time   04/24/12 11:35 PM      Component Value Range Status Comment   Specimen Description BLOOD LEFT ARM   Final    Special Requests BOTTLES DRAWN AEROBIC ONLY 5CC   Final    Culture  Setup Time 04/25/2012 11:02   Final    Culture     Final    Value:        BLOOD CULTURE RECEIVED NO GROWTH TO DATE CULTURE WILL BE HELD FOR 5 DAYS BEFORE ISSUING A FINAL NEGATIVE REPORT   Report Status PENDING   Incomplete   CULTURE, BLOOD (ROUTINE X 2)     Status: Normal (Preliminary result)   Collection Time   04/24/12 11:40 PM      Component Value Range Status Comment   Specimen Description BLOOD LEFT HAND   Final    Special Requests BOTTLES DRAWN AEROBIC ONLY 5CC   Final    Culture  Setup Time 04/25/2012 11:02   Final    Culture     Final    Value:        BLOOD CULTURE RECEIVED NO GROWTH TO DATE CULTURE WILL BE HELD FOR 5 DAYS BEFORE ISSUING A FINAL NEGATIVE REPORT   Report Status PENDING   Incomplete   CULTURE, BLOOD (ROUTINE X 2)     Status: Normal  (Preliminary result)   Collection Time   04/28/12  3:38 PM      Component Value Range Status Comment   Specimen Description BLOOD RIGHT HAND   Final    Special Requests BOTTLES DRAWN AEROBIC ONLY 5CC   Final    Culture  Setup Time 04/28/2012 22:13   Final    Culture     Final    Value:        BLOOD CULTURE RECEIVED NO GROWTH TO DATE CULTURE WILL BE HELD FOR 5 DAYS BEFORE ISSUING A FINAL NEGATIVE REPORT   Report Status PENDING   Incomplete   CULTURE, BLOOD (ROUTINE X 2)     Status: Normal (Preliminary result)   Collection Time   04/28/12  3:48 PM  Component Value Range Status Comment   Specimen Description BLOOD RIGHT ARM   Final    Special Requests BOTTLES DRAWN AEROBIC ONLY 5CC   Final    Culture  Setup Time 04/28/2012 22:12   Final    Culture     Final    Value:        BLOOD CULTURE RECEIVED NO GROWTH TO DATE CULTURE WILL BE HELD FOR 5 DAYS BEFORE ISSUING A FINAL NEGATIVE REPORT   Report Status PENDING   Incomplete     Scheduled Meds: reviewed Continuous Infusions: reviewed _________________________________________________________________  Russella Dar ANP [facility] Pager [phone] If 8PM-8AM, please contact night-coverage at [email or web address], password [facility] 04/30/2012, 11:06 AM  LOS: 28 days    I have personally examined this patient and reviewed the entire database. I have reviewed the above note, made any necessary editorial changes, and agree with its content.  Lonia Blood, MD [facility]

## 2012-04-30 NOTE — Progress Notes (Signed)
Pt. Returned from CT guided drain placement. Vitals stable. Pt. comfortable with family at bedside.

## 2012-04-30 NOTE — Progress Notes (Signed)
PARENTERAL NUTRITION CONSULT NOTE - FOLLOW UP  Pharmacy Consult for TPN Indication: SBO, s/p LOA/SBR/anastamosis , s/p LOA/SBR/re-anastamosis with patch repair for leak  Allergies  Allergen Reactions  . Allopurinol     REACTION: decreased platelets  . Aspirin     REACTION: unspecified    Patient Measurements: Height: 5\' 11"  (180.3 cm) Weight: 152 lb 1.9 oz (69 kg) IBW/kg (Calculated) : 75.3   Vital Signs: Temp: 97.4 F (36.3 C) (08/09 0650) Temp src: Oral (08/09 0650) BP: 147/73 mmHg (08/09 0830) Pulse Rate: 84  (08/09 0830) Intake/Output from previous day: 08/08 0701 - 08/09 0700 In: 3326 [Blood:350; IV Piggyback:1056; TPN:1720] Out: 1346 [Emesis/NG output:350] Intake/Output from this shift:    Labs:  [facility] 04/30/12 0500 04/29/12 2051 04/29/12 0710  WBC PENDING 20.6* 23.6*  HGB 8.3* 7.4* 7.4*  HCT 24.2* 22.3* 21.8*  PLT 163 133* 139*  APTT -- -- --  INR -- -- --     Basename 04/30/12 0500 04/29/12 0710 04/28/12 0715  NA 129* 131* 130*  K 3.8 3.4* 3.1*  CL 94* 96 95*  CO2 23 22 20   GLUCOSE 180* 137* 106*  BUN 49* 60* 74*  CREATININE 3.64* 4.62* 5.60*  LABCREA -- -- --  CREAT24HRUR -- -- --  CALCIUM 8.9 9.1 9.4  MG -- 1.6 --  PHOS 3.3 2.2* 1.0*  PROT -- 4.8* --  ALBUMIN -- 1.4* 1.5*  AST -- 24 --  ALT -- 14 --  ALKPHOS -- 70 --  BILITOT -- 1.2 --  BILIDIR -- -- --  IBILI -- -- --  PREALBUMIN -- -- --  TRIG -- -- --  CHOLHDL -- -- --  CHOL -- -- --   Estimated Creatinine Clearance: 23.2 ml/min (by C-G formula based on Cr of 3.64).    Basename 04/30/12 0444 04/30/12 0055 04/29/12 2114  GLUCAP 141* 199* 191*    Insulin Requirements in the past 24 hours:  12 units Novolog. Currently on Moderate SSI q4h with 10 units insulin in TNA  Current Nutrition:  NPO, Clinimix 5/15 at 70 ml/hr with lipids at 10 ml/hr MWF only  Admit:  Admitted 7/13 with abd pain, weight loss, SBO. Presently in the OR for repeat thrombectomy of L thigh AV Graft. (s/p  thrombectomy 7/25).  GI-  SBO, back to OR 8/1 for anastomotic leak- now s/p patch repair. S/p perc drains for intra-abdominal abscess Has been essentially NPO since admit, therefore at significant risk for re-feeding, but has been tolerating TPN thus far. NGT output 350 as opf 86 am, none recorded since then. Endo: Moderate SSI q4, on Solu-Cortef-back to bid dosing. CBG currently 141-199 with insulin in TNA.   Lytes:  Na low, likely d/t overload and Clinimix without supplemented lytes. K 3.8, phos 3.3.  Corrected Ca =10.98,slightly high despite no lytes in TNA. Renal:  ESRD w/HD typically T/TH/Sat - tolerating HD this am - Ferrlecit, Aranesp-Sat Pulm: extubated; now on RA Cards: hypertensive. Pos trop, on prn anti-HTNs; hgb 8.3 this am with prn transfusions Hepatobil: LFTs, trig and chol WNL.  Prealbumin decreased - will follow. Neuro:  Morphine prn for severe pain ID: Afeb, WBC elevated. Lower abdominal peritonitis- on Mycamine and renally adjusted Zosyn Best Practices:  Mouthcare, PPI-IV, scd's ordered, pt refusing  Nutritional Goals:  1700-1950 kCal, 75-85 grams of protein per day  Plan:  - Continue Clinimix 5/15 to 70 ml/hr, which meets protein goal but is just below kcal. - Provide MVI and lipids (@10cc /hr) on MWF only  d/t national backorder. - Continue 10 units insulin in TNA - F/u am BMP  Talbert Cage, PharmD Clinical Pharmacist Pager: [phone] Pharmacy: [phone] 04/30/2012 8:51 AM

## 2012-04-30 NOTE — Progress Notes (Signed)
General Surgery Note  LOS: 28 days  POD# 16 and 8 Room - 2602  Assessment/Plan: 1.  EXPLORATORY LAPAROTOMY - LOA and SBR x 2 - T. Cornett - 04/14/2012   Re-explore for leak - S. Gross - 04/22/2012  Open wound - doing okay  On Zosyn.  WBC - 23,600 - 8/8/20132, pending today  Still has NGT  1a.  Intra-abdominal abscess by CT scan - no obvious leak  Orders written for IR for perc drainage.  Discussed with nursing and patient.  2. CKD (chronic kidney disease) stage V requiring chronic dialysis  Dialysis - In dialysis at current time.  3. Thrombocytopenia  Plt - 163,000 - 04/30/2012 4. Gout  5.  HTN (hypertension)  6.  Chronic use of steroids  7.  Malnutrition, severe - Alb - 1.7 - 04/26/2012, Prealb - 7.7 - 04/26/2012  On TPN 8.  Leg graft occlusion   Unclotted - C. Dickson - 04/27/2012 9.  Anemia   Hgb - 8.3 - 04/30/2012 10.  Impacted - ordered enemas for a couple of days.  Subjective:  Does not say much.  On dialysis.  Has NGT.  Objective:   Filed Vitals:   04/30/12 0800  BP: 145/72  Pulse: 85  Temp:   Resp: 14    Intake/Output from previous day:  08/08 0701 - 08/09 0700 In: 3326 [Blood:350; IV Piggyback:1056; TPN:1720] Out: 1346 [Emesis/NG output:350]  Intake/Output this shift:      Physical Exam:   General: Thin AA M who is alert and oriented.    HEENT: Normal. Pupils equal. .   Lungs: Clear   Abdomen: Very thin.  Soft, but quiet.  Has HD cath in right groin (this limits mobility)   Wound: Fair.   Lab Results:     Basename 04/30/12 0500 04/29/12 2051  WBC PENDING 20.6*  HGB 8.3* 7.4*  HCT 24.2* 22.3*  PLT 163 133*    BMET    Basename 04/29/12 0710 04/28/12 0715  NA 131* 130*  K 3.4* 3.1*  CL 96 95*  CO2 22 20  GLUCOSE 137* 106*  BUN 60* 74*  CREATININE 4.62* 5.60*  CALCIUM 9.1 9.4    PT/INR  No results found for this basename: LABPROT:2,INR:2 in the last 72 hours  ABG  No results found for this basename: PHART:2,PCO2:2,PO2:2,HCO3:2 in the last 72  hours   Studies/Results:  Ct Abdomen Pelvis W Contrast  04/29/2012  *RADIOLOGY REPORT*  Clinical Data: Post surgery for small bowel obstruction in an anastomotic leak.  Leukocytosis.  Dialysis patient.  CT ABDOMEN AND PELVIS WITH CONTRAST  Technique:  Multidetector CT imaging of the abdomen and pelvis was performed following the standard protocol during bolus administration of intravenous contrast.  Contrast: OMNIPAQUE IOHEXOL 300 MG/ML  SOLN  Comparison: 04/03/2012  Findings: New small left pleural effusion with basilar atelectasis or consolidation on the left.  Enteric tube with tip in the distal stomach.  The liver and spleen appear homogeneous without enlargement.  Portal and mesenteric vessels appear patent.  The gallbladder is moderately distended and is filled with increased density material suggesting vicarious contrast excretion. Pancreatic duct is diffusely dilated as seen previously.  No obstructing mass or stone is visualized.  Consider MRCP for further evaluation.  Extensive calcification of the abdominal aorta, iliac vessels, and splenic artery.  Calcification of the mesenteric artery with flow demonstrated.  Left adrenal gland nodule measuring 14 mm diameter.  This appears stable since the previous study but density measures are  indeterminate.  No significant retroperitoneal lymphadenopathy.  Bilateral renal atrophy with multiple bilateral renal cysts.  No hydronephrosis.  The stomach, small bowel, and colon are decompressed.  No residual small bowel obstruction. Interval postoperative changes with midline surgical defect along the anterior abdominal wall and skin clips in the left groin region.  There is interval development of a large gas and fluid collections in the right pelvis extending from anteriorly to posteriorly and extending up into the right lower quadrant.  This collection measures up to about 16.5 x 13.7 x 6.4 cm.  The collection has a mildly thickened enhancing wall and  air-fluid levels are present.  The appearance is consistent with loculated fluid or abscess.  There are additional loculated fluid collections in the anterior right lower quadrant measuring 3.3 x .9 centimeters and between the medial ascending colon and psoas muscle measuring 2.6 x 2.6 cm.  Sulci are consistent with abscesses.  Probably loculated fluid collection at the tip of the liver inferiorly. Infiltration into the mesenteric fat in the upper abdomen around the spleen and liver may represent edema.  No loculation is apparent.  Pelvis:  Pelvic transplant kidney with diffuse atrophy and prominent renal sinus fat.  No hydronephrosis.  Vascular calcification in the graft.  There  is a vascular graft extending from the common femoral vein inferiorly and from the common femoral artery laterally on the left.  Extensive calcification of the native femoral arteries bilaterally with suggestion of significant stenosis of the right common femoral artery.  Edema in the subcutaneous soft tissues particularly on the left.  Normal alignment of the lumbar vertebra.  IMPRESSION: Interval postoperative changes.  No residual bowel obstruction. Multiple loculated fluid collections including a very large right abdominal and pelvic collection consistent with abscesses. Vicarious contrast excretion into the gallbladder.  New left pleural effusion and basilar atelectasis.  Additional incidental findings are stable since the previous study.  Results were telephoned to and Marchelle Folks, the patient's nurse on [facility]- [id number] at the time of dictation, 2129 hours on 04/29/2012.  Original Report Authenticated By: Marlon Pel, M.D.   Dg Chest Port 1 View  04/28/2012  *RADIOLOGY REPORT*  Clinical Data: Progressive leukocytosis.  PORTABLE CHEST - 1 VIEW  Comparison: Chest x-ray dated 04/23/2012 and chest CT dated 04/25/2012  Findings: There is a persistent moderate left effusion with compressive atelectasis in the left lower lobe.  Heart size and  vascularity are normal.  Right lung is clear.  NG tube tip is below the diaphragm.  IMPRESSION: Progressive left pleural effusion and left lower lobe atelectasis.  Original Report Authenticated By: Gwynn Burly, M.D.   Dg Abd Portable 1v  04/28/2012  *RADIOLOGY REPORT*  Clinical Data: Abdominal pain.  Ileus.  Progressive leukocytosis.  PORTABLE ABDOMEN - 1 VIEW  Comparison: Radiographs dated 04/20/2012  Findings: NG tube tip is in the body of the stomach.  There are no dilated loops of bowel.  Double-lumen catheter is in the right femoral vein with the tips in the inferior vena cava.  Contrast is seen in the gallbladder.  There is also scattered high density material in the bowel.  IMPRESSION: No acute abnormalities.  Original Report Authenticated By: Gwynn Burly, M.D.     Anti-infectives:   Anti-infectives     Start     Dose/Rate Route Frequency Ordered Stop   04/29/12 2330   vancomycin (VANCOCIN) 1,500 mg in sodium chloride 0.9 % 500 mL IVPB        1,500 mg  250 mL/hr over 120 Minutes Intravenous  Once 04/29/12 2259 04/30/12 0246   04/27/12 0000   ceFAZolin (ANCEF) IVPB 1 g/50 mL premix  Status:  Discontinued     Comments: Send with pt to OR      1 g 100 mL/hr over 30 Minutes Intravenous On call 04/26/12 0927 04/26/12 0928   04/24/12 1000   micafungin (MYCAMINE) 100 mg in sodium chloride 0.9 % 100 mL IVPB        100 mg 100 mL/hr over 1 Hours Intravenous Daily 04/24/12 0750     04/23/12 1600   fluconazole (DIFLUCAN) IVPB 400 mg        400 mg 200 mL/hr over 60 Minutes Intravenous  Once 04/23/12 1428 04/23/12 1800   04/22/12 2200   piperacillin-tazobactam (ZOSYN) IVPB 3.375 g  Status:  Discontinued        3.375 g 100 mL/hr over 30 Minutes Intravenous 3 times per day 04/22/12 2120 04/22/12 2137   04/22/12 2200  piperacillin-tazobactam (ZOSYN) IVPB 2.25 g       2.25 g 100 mL/hr over 30 Minutes Intravenous 3 times per day 04/22/12 2140     04/22/12 2157   gentamicin (GARAMYCIN)  injection  Status:  Discontinued          As needed 04/22/12 2158 04/22/12 2335   04/22/12 2156   clindamycin (CLEOCIN) injection  Status:  Discontinued          As needed 04/22/12 2157 04/22/12 2335   04/22/12 2145  piperacillin-tazobactam (ZOSYN) IVPB 3.375 g       3.375 g 12.5 mL/hr over 240 Minutes Intravenous  Once 04/22/12 2141 04/23/12 0145   04/22/12 2130   gentamicin (GARAMYCIN) 240 mg, clindamycin (CLEOCIN) 900 mg in sodium chloride irrigation 0.9 % 1,000 mL irrigation  Status:  Discontinued         Irrigation  Once 04/22/12 2119 04/23/12 0020   04/14/12 0830   ertapenem (INVANZ) 1 g in sodium chloride 0.9 % 50 mL IVPB        1 g 100 mL/hr over 30 Minutes Intravenous To Surgery 04/14/12 0816 04/14/12 0834   04/13/12 1359   ertapenem (INVANZ) 1 g in sodium chloride 0.9 % 50 mL IVPB  Status:  Discontinued        1 g 100 mL/hr over 30 Minutes Intravenous 60 min pre-op 04/13/12 1359 04/14/12 0816          Ovidio Kin, MD, FACS Pager: [phone],   [facility] Office: [phone] 04/30/2012

## 2012-04-30 NOTE — Consult Note (Signed)
[facility]  Total days of antibiotics 9        Zosyn Day 9        Micafungin Day 7        Vanc Day 1       Reason for Consult:Abdominal Abscesses and suspected peritonitis    Referring Physician: Osie Cheeks Oti  Principal Problem:  *SBO (small bowel obstruction) s/p EL/LOA and SBR x 2 Active Problems:  CKD (chronic kidney disease) stage V requiring chronic dialysis  Erythrocytosis  Thrombocytopenia  Gout  HTN (hypertension)  Ileus following gastrointestinal surgery  Chronic use of steroids  Malnutrition, calorie  Leg graft occlusion  Acute respiratory failure with hypoxia  Sinus tachycardia      . antiseptic oral rinse  15 mL Mouth Rinse q12n4p  . darbepoetin (ARANESP) injection - DIALYSIS  200 mcg Intravenous Q Sat-HD  . fentaNYL  25 mcg Transdermal Q72H  . ferric gluconate (FERRLECIT/NULECIT) IV  125 mg Intravenous Q T,Th,Sa-HD  . hydrocortisone sod succinate (SOLU-CORTEF) injection  25 mg Intravenous Q12H  . insulin aspart  0-15 Units Subcutaneous Q4H  . lip balm   Topical BID  . metoprolol  5 mg Intravenous Q6H  . micafungin (MYCAMINE) IV  100 mg Intravenous Daily  . pantoprazole (PROTONIX) IV  40 mg Intravenous Q24H  . piperacillin-tazobactam (ZOSYN)  IV  2.25 g Intravenous Q8H  . potassium phosphate IVPB (mmol)  10 mmol Intravenous Once  . sodium chloride  10-40 mL Intracatheter Q12H  . sodium phosphate  1 enema Rectal BID  . vancomycin  1,500 mg Intravenous Once  . DISCONTD: hydrocortisone sodium succinate  50 mg Intravenous Daily    HPI: [name] is a 51 y.o. male with ESRD and chronic steroid use was admitted with progressively worsening abdominal pain. CT was done and showed SBO on 7/24 - lysis of adhesions and ilectomy and partial jejunectomy done during exploratory laparotomy. On 7/25 a clot was found on L femoral AV graft and therefore thrombectomy was done. Post surgery, he continued to have abdominal pain and on 04/23/12 he  had to have surgery again and an anastomotic leak with peritonitis was found. Also, on 04/29/12 CT abdomen showed multiple loculated fluid collections including a very large right abdominal and pelvic abscess. Pleural effusion and basilar atelectasis was appreciated as well.    Review of Systems: General: no fevers, chills,  Skin: no rash HEENT: no blurry vision, hearing changes, sore throat Pulm: no dyspnea, coughing, wheezing CV: no chest pain, palpitations, shortness of breath Abd: abdominal pain, nausea/vomiting, diarrhea/constipation GU: no dysuria, hematuria, polyuria Ext: no arthralgias, myalgias Neuro: no weakness, numbness, or tingling   Past Medical History  Diagnosis Date  . Dialysis patient   . Prostate cancer   . Renal insufficiency     History  Substance Use Topics  . Smoking status: Never Smoker   . Smokeless tobacco: Not on file  . Alcohol Use: No    Family History  Problem Relation Age of Onset  . Hypertension Father   . Pneumonia Brother   . Lung disease Brother    Allergies  Allergen Reactions  . Allopurinol     REACTION: decreased platelets  . Aspirin     REACTION: unspecified    OBJECTIVE: Blood pressure 130/67, pulse 91, temperature 98.4 F (36.9 C), temperature source Oral, resp. rate 14, height 5\' 11"  (1.803 m), weight 66.4 kg (146 lb 6.2 oz), SpO2 100.00%. General: oriented x 3,  NAD HEENT: PERRL, EOMI, scleral icterus Cardiac: RRR, no rubs, murmurs or gallops Pulm: clear to auscultation bilaterally, moving normal volumes of air Abd: soft, tender, nondistended, BS present Ext: warm and well perfused, no pedal edema, dry skin Neuro: alert and oriented X3, cranial nerves II-XII grossly intact    Microbiology: Recent Results (from the past 240 hour(s))  CULTURE, BLOOD (ROUTINE X 2)     Status: Normal (Preliminary result)   Collection Time   04/24/12 11:35 PM      Component Value Range Status Comment   Specimen Description BLOOD LEFT ARM    Final    Special Requests BOTTLES DRAWN AEROBIC ONLY 5CC   Final    Culture  Setup Time 04/25/2012 11:02   Final    Culture     Final    Value:        BLOOD CULTURE RECEIVED NO GROWTH TO DATE CULTURE WILL BE HELD FOR 5 DAYS BEFORE ISSUING A FINAL NEGATIVE REPORT   Report Status PENDING   Incomplete   CULTURE, BLOOD (ROUTINE X 2)     Status: Normal (Preliminary result)   Collection Time   04/24/12 11:40 PM      Component Value Range Status Comment   Specimen Description BLOOD LEFT HAND   Final    Special Requests BOTTLES DRAWN AEROBIC ONLY 5CC   Final    Culture  Setup Time 04/25/2012 11:02   Final    Culture     Final    Value:        BLOOD CULTURE RECEIVED NO GROWTH TO DATE CULTURE WILL BE HELD FOR 5 DAYS BEFORE ISSUING A FINAL NEGATIVE REPORT   Report Status PENDING   Incomplete   CULTURE, BLOOD (ROUTINE X 2)     Status: Normal (Preliminary result)   Collection Time   04/28/12  3:38 PM      Component Value Range Status Comment   Specimen Description BLOOD RIGHT HAND   Final    Special Requests BOTTLES DRAWN AEROBIC ONLY 5CC   Final    Culture  Setup Time 04/28/2012 22:13   Final    Culture     Final    Value:        BLOOD CULTURE RECEIVED NO GROWTH TO DATE CULTURE WILL BE HELD FOR 5 DAYS BEFORE ISSUING A FINAL NEGATIVE REPORT   Report Status PENDING   Incomplete   CULTURE, BLOOD (ROUTINE X 2)     Status: Normal (Preliminary result)   Collection Time   04/28/12  3:48 PM      Component Value Range Status Comment   Specimen Description BLOOD RIGHT ARM   Final    Special Requests BOTTLES DRAWN AEROBIC ONLY 5CC   Final    Culture  Setup Time 04/28/2012 22:12   Final    Culture     Final    Value:        BLOOD CULTURE RECEIVED NO GROWTH TO DATE CULTURE WILL BE HELD FOR 5 DAYS BEFORE ISSUING A FINAL NEGATIVE REPORT   Report Status PENDING   Incomplete    Impression: This patient was found to have peritonitis and several fluid collections suggesting intra-abdominal abscesses and will  undergo percutaneous drain placement today. We are hoping the the fluid is given for gram stain and culture. Once gram stain/cultures return, we will treat accordingly. Until then, continue empiric treatment.  Plan: 1. Peritonitis/Abscess As patient has chronic steroid use + ESRD, patient is immunocompromised therefore broad spectrum  antibiotics are required until a bacteria is isolated.  - increased WBC count - 22.4 plus abdominal pain  - continue  Zosyn, Micafungin. Will discuss with Dr. Daiva Eves. - order gram stain and culture of drained fluid. Repeat Blood cultures x 2 - blood cultures negative thus far  2. Pleural effusion As pt has ESRD, it is unclear whether effusion is related to infection or a result of fluid overload. Will continue to monitor.      [facility] 04/30/2012, 11:08 AM  This is a Psychologist, occupational Note. The care of the patient was discussed with Dr. Daiva Eves and the assessment and plan was formulated with their assistance. Please see their note for official documentation of the patient encounter.    INFECTIOUS DISEASE ATTENDING ADDENDUM:     [facility]   Date: 04/30/2012  Patient name: [name]  Medical record number: [id number]  Date of birth: 02-28-1961    This patient has been seen and discussed with the house staff. Please see their note for complete details. I concur with their findings with the following additions/corrections:  51 year old with recent multiple abdominal surgeries now with multiple intrabdominal abscesses.  PLEASE HOLD OFF ON BROADENING HIS THERAPY BEFORE OBTAINING CULTURES FROM IR GUIDED ASPIRATE OF HIS ABSCESS  I DON'T THINK WE NEED TO ADD VANCOMYCIN AT THIS POINT PRESUMING PATHOGENS INVOLVED ARE PRIMARILY GUT FLORA  F/U CULTURE DATA AND TAILOR  Dr. Drue Second will be covering this weekend and is available for questions.   Acey Lav 04/30/2012, 5:32 PM

## 2012-04-30 NOTE — Procedures (Signed)
Patient was seen on dialysis and the procedure was supervised.  BFR 400  Via AVG BP is  142/76.   Patient appears to be tolerating treatment well  [name] A 04/30/2012

## 2012-04-30 NOTE — H&P (Signed)
[name] is an 51 y.o. male.   Chief Complaint: bowel obstruction surgery 04/14/12 Post op right lower quadrant abscess Scheduled now for abscess drain placement HPI: ESRD; prostate Ca; HTN; Troponin 1.9 (8/7)  Past Medical History  Diagnosis Date  . Dialysis patient   . Prostate cancer   . Renal insufficiency     Past Surgical History  Procedure Date  . Laparotomy 04/14/2012    Procedure: EXPLORATORY LAPAROTOMY;  Surgeon: Clovis Pu. Cornett, MD;  Location: [facility] OR;  Service: General;  Laterality: N/A;  . Colon resection 04/14/2012  . Laparotomy 04/22/2012    Procedure: EXPLORATORY LAPAROTOMY;  Surgeon: Ardeth Sportsman, MD;  Location: [facility] OR;  Service: General;  Laterality: N/A;  lysis of adhesions, omentoplasty, repair small bowel  . Thrombectomy w/ embolectomy 04/27/2012    Procedure: THROMBECTOMY ARTERIOVENOUS GORE-TEX GRAFT;  Surgeon: Chuck Hint, MD;  Location: [facility] OR;  Service: Vascular;  Laterality: Left;  Thrombectomy of left thigh arteriovenous gortex graft    Family History  Problem Relation Age of Onset  . Hypertension Father   . Pneumonia Brother   . Lung disease Brother    Social History:  reports that he has never smoked. He does not have any smokeless tobacco history on file. He reports that he does not drink alcohol or use illicit drugs.  Allergies:  Allergies  Allergen Reactions  . Allopurinol     REACTION: decreased platelets  . Aspirin     REACTION: unspecified    Medications Prior to Admission  Medication Sig Dispense Refill  . colchicine 0.6 MG tablet Take 0.6 mg by mouth daily.      Marland Kitchen lanthanum (FOSRENOL) 1000 MG chewable tablet Chew 2,000 mg by mouth 3 (three) times daily with meals.      . multivitamin (RENA-VIT) TABS tablet Take 1 tablet by mouth daily.      . predniSONE (DELTASONE) 5 MG tablet Take 5 mg by mouth daily.        Results for orders placed during the hospital encounter of 04/02/12 (from the past 48 hour(s))  CARDIAC  PANEL(CRET KIN+CKTOT+MB+TROPI)     Status: Abnormal   Collection Time   04/28/12 11:00 AM      Component Value Range Comment   Total CK 46  7 - 232 U/L    CK, MB 4.1 (*) 0.3 - 4.0 ng/mL    Troponin I 1.91 (*) <0.30 ng/mL    Relative Index RELATIVE INDEX IS INVALID  0.0 - 2.5   GLUCOSE, CAPILLARY     Status: Abnormal   Collection Time   04/28/12  1:36 PM      Component Value Range Comment   Glucose-Capillary 154 (*) 70 - 99 mg/dL    Comment 1 Notify RN     GLUCOSE, CAPILLARY     Status: Abnormal   Collection Time   04/28/12  2:24 PM      Component Value Range Comment   Glucose-Capillary 161 (*) 70 - 99 mg/dL    Comment 1 Notify RN     CULTURE, BLOOD (ROUTINE X 2)     Status: Normal (Preliminary result)   Collection Time   04/28/12  3:38 PM      Component Value Range Comment   Specimen Description BLOOD RIGHT HAND      Special Requests BOTTLES DRAWN AEROBIC ONLY 5CC      Culture  Setup Time 04/28/2012 22:13      Culture  Value:        BLOOD CULTURE RECEIVED NO GROWTH TO DATE CULTURE WILL BE HELD FOR 5 DAYS BEFORE ISSUING A FINAL NEGATIVE REPORT   Report Status PENDING     CULTURE, BLOOD (ROUTINE X 2)     Status: Normal (Preliminary result)   Collection Time   04/28/12  3:48 PM      Component Value Range Comment   Specimen Description BLOOD RIGHT ARM      Special Requests BOTTLES DRAWN AEROBIC ONLY 5CC      Culture  Setup Time 04/28/2012 22:12      Culture        Value:        BLOOD CULTURE RECEIVED NO GROWTH TO DATE CULTURE WILL BE HELD FOR 5 DAYS BEFORE ISSUING A FINAL NEGATIVE REPORT   Report Status PENDING     GLUCOSE, CAPILLARY     Status: Abnormal   Collection Time   04/28/12  4:42 PM      Component Value Range Comment   Glucose-Capillary 175 (*) 70 - 99 mg/dL    Comment 1 Notify RN     GLUCOSE, CAPILLARY     Status: Abnormal   Collection Time   04/28/12  4:49 PM      Component Value Range Comment   Glucose-Capillary 131 (*) 70 - 99 mg/dL    Comment 1 Notify RN       GLUCOSE, CAPILLARY     Status: Abnormal   Collection Time   04/28/12  8:06 PM      Component Value Range Comment   Glucose-Capillary 179 (*) 70 - 99 mg/dL   GLUCOSE, CAPILLARY     Status: Abnormal   Collection Time   04/28/12 11:27 PM      Component Value Range Comment   Glucose-Capillary 148 (*) 70 - 99 mg/dL   GLUCOSE, CAPILLARY     Status: Abnormal   Collection Time   04/29/12 12:08 AM      Component Value Range Comment   Glucose-Capillary 161 (*) 70 - 99 mg/dL   GLUCOSE, CAPILLARY     Status: Abnormal   Collection Time   04/29/12  3:39 AM      Component Value Range Comment   Glucose-Capillary 151 (*) 70 - 99 mg/dL   COMPREHENSIVE METABOLIC PANEL     Status: Abnormal   Collection Time   04/29/12  7:10 AM      Component Value Range Comment   Sodium 131 (*) 135 - 145 mEq/L    Potassium 3.4 (*) 3.5 - 5.1 mEq/L    Chloride 96  96 - 112 mEq/L    CO2 22  19 - 32 mEq/L    Glucose, Bld 137 (*) 70 - 99 mg/dL    BUN 60 (*) 6 - 23 mg/dL    Creatinine, Ser 1.61 (*) 0.50 - 1.35 mg/dL    Calcium 9.1  8.4 - 09.6 mg/dL    Total Protein 4.8 (*) 6.0 - 8.3 g/dL    Albumin 1.4 (*) 3.5 - 5.2 g/dL    AST 24  0 - 37 U/L    ALT 14  0 - 53 U/L    Alkaline Phosphatase 70  39 - 117 U/L    Total Bilirubin 1.2  0.3 - 1.2 mg/dL    GFR calc non Af Amer 13 (*) >90 mL/min    GFR calc Af Amer 15 (*) >90 mL/min   MAGNESIUM     Status: Normal  Collection Time   04/29/12  7:10 AM      Component Value Range Comment   Magnesium 1.6  1.5 - 2.5 mg/dL   PHOSPHORUS     Status: Abnormal   Collection Time   04/29/12  7:10 AM      Component Value Range Comment   Phosphorus 2.2 (*) 2.3 - 4.6 mg/dL   CBC     Status: Abnormal   Collection Time   04/29/12  7:10 AM      Component Value Range Comment   WBC 23.6 (*) 4.0 - 10.5 K/uL WHITE COUNT CONFIRMED ON SMEAR   RBC 2.68 (*) 4.22 - 5.81 MIL/uL    Hemoglobin 7.4 (*) 13.0 - 17.0 g/dL DELTA CHECK NOTED   HCT 21.8 (*) 39.0 - 52.0 %    MCV 81.3  78.0 - 100.0 fL    MCH 27.6   26.0 - 34.0 pg    MCHC 33.9  30.0 - 36.0 g/dL    RDW 16.1 (*) 09.6 - 15.5 %    Platelets 139 (*) 150 - 400 K/uL PLATELET COUNT CONFIRMED BY SMEAR  GLUCOSE, CAPILLARY     Status: Abnormal   Collection Time   04/29/12  8:26 AM      Component Value Range Comment   Glucose-Capillary 123 (*) 70 - 99 mg/dL    Comment 1 Notify RN     TYPE AND SCREEN     Status: Normal (Preliminary result)   Collection Time   04/29/12  9:10 AM      Component Value Range Comment   ABO/RH(D) B POS      Antibody Screen NEG      Sample Expiration 05/02/2012      Unit Number [id number]      Blood Component Type RED CELLS,LR      Unit division 00      Status of Unit ISSUED      Transfusion Status OK TO TRANSFUSE      Crossmatch Result Compatible      Unit Number [id number]      Blood Component Type RED CELLS,LR      Unit division 00      Status of Unit ISSUED,FINAL      Transfusion Status OK TO TRANSFUSE      Crossmatch Result Compatible     PREPARE RBC (CROSSMATCH)     Status: Normal   Collection Time   04/29/12  9:10 AM      Component Value Range Comment   Order Confirmation ORDER PROCESSED BY BLOOD BANK     GLUCOSE, CAPILLARY     Status: Abnormal   Collection Time   04/29/12  1:18 PM      Component Value Range Comment   Glucose-Capillary 129 (*) 70 - 99 mg/dL    Comment 1 Notify RN     GLUCOSE, CAPILLARY     Status: Abnormal   Collection Time   04/29/12  4:30 PM      Component Value Range Comment   Glucose-Capillary 132 (*) 70 - 99 mg/dL    Comment 1 Notify RN     CBC     Status: Abnormal   Collection Time   04/29/12  8:51 PM      Component Value Range Comment   WBC 20.6 (*) 4.0 - 10.5 K/uL    RBC 2.66 (*) 4.22 - 5.81 MIL/uL    Hemoglobin 7.4 (*) 13.0 - 17.0 g/dL    HCT 21.3 (*) 08.6 - 52.0 %  MCV 83.8  78.0 - 100.0 fL    MCH 27.8  26.0 - 34.0 pg    MCHC 33.2  30.0 - 36.0 g/dL    RDW 45.4 (*) 09.8 - 15.5 %    Platelets 133 (*) 150 - 400 K/uL   GLUCOSE, CAPILLARY     Status: Abnormal   Collection  Time   04/29/12  9:14 PM      Component Value Range Comment   Glucose-Capillary 191 (*) 70 - 99 mg/dL   GLUCOSE, CAPILLARY     Status: Abnormal   Collection Time   04/30/12 12:55 AM      Component Value Range Comment   Glucose-Capillary 199 (*) 70 - 99 mg/dL    Comment 1 Documented in Chart      Comment 2 Notify RN     GLUCOSE, CAPILLARY     Status: Abnormal   Collection Time   04/30/12  4:44 AM      Component Value Range Comment   Glucose-Capillary 141 (*) 70 - 99 mg/dL    Comment 1 Documented in Chart      Comment 2 Notify RN     CBC WITH DIFFERENTIAL     Status: Abnormal   Collection Time   04/30/12  5:00 AM      Component Value Range Comment   WBC 22.4 (*) 4.0 - 10.5 K/uL WHITE COUNT CONFIRMED ON SMEAR   RBC 2.96 (*) 4.22 - 5.81 MIL/uL    Hemoglobin 8.3 (*) 13.0 - 17.0 g/dL    HCT 11.9 (*) 14.7 - 52.0 %    MCV 81.8  78.0 - 100.0 fL    MCH 28.0  26.0 - 34.0 pg    MCHC 34.3  30.0 - 36.0 g/dL    RDW 82.9 (*) 56.2 - 15.5 %    Platelets 163  150 - 400 K/uL    Neutrophils Relative 85 (*) 43 - 77 %    Lymphocytes Relative 5 (*) 12 - 46 %    Monocytes Relative 9  3 - 12 %    Eosinophils Relative 0  0 - 5 %    Basophils Relative 1  0 - 1 %    Neutro Abs 19.1 (*) 1.7 - 7.7 K/uL    Lymphs Abs 1.1  0.7 - 4.0 K/uL    Monocytes Absolute 2.0 (*) 0.1 - 1.0 K/uL    Eosinophils Absolute 0.0  0.0 - 0.7 K/uL    Basophils Absolute 0.2 (*) 0.0 - 0.1 K/uL    RBC Morphology POLYCHROMASIA PRESENT      WBC Morphology TOXIC GRANULATION     PHOSPHORUS     Status: Normal   Collection Time   04/30/12  5:00 AM      Component Value Range Comment   Phosphorus 3.3  2.3 - 4.6 mg/dL   BASIC METABOLIC PANEL     Status: Abnormal   Collection Time   04/30/12  5:00 AM      Component Value Range Comment   Sodium 129 (*) 135 - 145 mEq/L    Potassium 3.8  3.5 - 5.1 mEq/L    Chloride 94 (*) 96 - 112 mEq/L    CO2 23  19 - 32 mEq/L    Glucose, Bld 180 (*) 70 - 99 mg/dL    BUN 49 (*) 6 - 23 mg/dL    Creatinine,  Ser 1.30 (*) 0.50 - 1.35 mg/dL    Calcium 8.9  8.4 - 86.5 mg/dL  GFR calc non Af Amer 18 (*) >90 mL/min    GFR calc Af Amer 21 (*) >90 mL/min    Ct Abdomen Pelvis W Contrast  04/29/2012  *RADIOLOGY REPORT*  Clinical Data: Post surgery for small bowel obstruction in an anastomotic leak.  Leukocytosis.  Dialysis patient.  CT ABDOMEN AND PELVIS WITH CONTRAST  Technique:  Multidetector CT imaging of the abdomen and pelvis was performed following the standard protocol during bolus administration of intravenous contrast.  Contrast: OMNIPAQUE IOHEXOL 300 MG/ML  SOLN  Comparison: 04/03/2012  Findings: New small left pleural effusion with basilar atelectasis or consolidation on the left.  Enteric tube with tip in the distal stomach.  The liver and spleen appear homogeneous without enlargement.  Portal and mesenteric vessels appear patent.  The gallbladder is moderately distended and is filled with increased density material suggesting vicarious contrast excretion. Pancreatic duct is diffusely dilated as seen previously.  No obstructing mass or stone is visualized.  Consider MRCP for further evaluation.  Extensive calcification of the abdominal aorta, iliac vessels, and splenic artery.  Calcification of the mesenteric artery with flow demonstrated.  Left adrenal gland nodule measuring 14 mm diameter.  This appears stable since the previous study but density measures are indeterminate.  No significant retroperitoneal lymphadenopathy.  Bilateral renal atrophy with multiple bilateral renal cysts.  No hydronephrosis.  The stomach, small bowel, and colon are decompressed.  No residual small bowel obstruction. Interval postoperative changes with midline surgical defect along the anterior abdominal wall and skin clips in the left groin region.  There is interval development of a large gas and fluid collections in the right pelvis extending from anteriorly to posteriorly and extending up into the right lower quadrant.   This collection measures up to about 16.5 x 13.7 x 6.4 cm.  The collection has a mildly thickened enhancing wall and air-fluid levels are present.  The appearance is consistent with loculated fluid or abscess.  There are additional loculated fluid collections in the anterior right lower quadrant measuring 3.3 x .9 centimeters and between the medial ascending colon and psoas muscle measuring 2.6 x 2.6 cm.  Sulci are consistent with abscesses.  Probably loculated fluid collection at the tip of the liver inferiorly. Infiltration into the mesenteric fat in the upper abdomen around the spleen and liver may represent edema.  No loculation is apparent.  Pelvis:  Pelvic transplant kidney with diffuse atrophy and prominent renal sinus fat.  No hydronephrosis.  Vascular calcification in the graft.  There  is a vascular graft extending from the common femoral vein inferiorly and from the common femoral artery laterally on the left.  Extensive calcification of the native femoral arteries bilaterally with suggestion of significant stenosis of the right common femoral artery.  Edema in the subcutaneous soft tissues particularly on the left.  Normal alignment of the lumbar vertebra.  IMPRESSION: Interval postoperative changes.  No residual bowel obstruction. Multiple loculated fluid collections including a very large right abdominal and pelvic collection consistent with abscesses. Vicarious contrast excretion into the gallbladder.  New left pleural effusion and basilar atelectasis.  Additional incidental findings are stable since the previous study.  Results were telephoned to and Marchelle Folks, the patient's nurse on [facility]- [id number] at the time of dictation, 2129 hours on 04/29/2012.  Original Report Authenticated By: Marlon Pel, M.D.   Dg Chest Port 1 View  04/28/2012  *RADIOLOGY REPORT*  Clinical Data: Progressive leukocytosis.  PORTABLE CHEST - 1 VIEW  Comparison: Chest x-ray dated  04/23/2012 and chest CT dated 04/25/2012   Findings: There is a persistent moderate left effusion with compressive atelectasis in the left lower lobe.  Heart size and vascularity are normal.  Right lung is clear.  NG tube tip is below the diaphragm.  IMPRESSION: Progressive left pleural effusion and left lower lobe atelectasis.  Original Report Authenticated By: Gwynn Burly, M.D.   Dg Abd Portable 1v  04/28/2012  *RADIOLOGY REPORT*  Clinical Data: Abdominal pain.  Ileus.  Progressive leukocytosis.  PORTABLE ABDOMEN - 1 VIEW  Comparison: Radiographs dated 04/20/2012  Findings: NG tube tip is in the body of the stomach.  There are no dilated loops of bowel.  Double-lumen catheter is in the right femoral vein with the tips in the inferior vena cava.  Contrast is seen in the gallbladder.  There is also scattered high density material in the bowel.  IMPRESSION: No acute abnormalities.  Original Report Authenticated By: Gwynn Burly, M.D.    Review of Systems  Constitutional: Positive for weight loss. Negative for fever.  Cardiovascular: Negative for chest pain.  Gastrointestinal: Positive for abdominal pain. Negative for nausea.  Neurological: Positive for weakness.    Blood pressure 168/84, pulse 97, temperature 97.9 F (36.6 C), temperature source Oral, resp. rate 17, height 5\' 11"  (1.803 m), weight 152 lb 1.9 oz (69 kg), SpO2 97.00%. Physical Exam  Constitutional: He is oriented to person, place, and time.       Frail; weak; thin In dialysis  Cardiovascular: Normal rate, regular rhythm and normal heart sounds.   Respiratory: Effort normal. He has wheezes.  GI: Soft. Bowel sounds are normal. There is tenderness.  Musculoskeletal:       Uses wc weak  Neurological: He is alert and oriented to person, place, and time.       Pt verbally responding but quiet and weak  Psychiatric: His behavior is normal. Thought content normal.     Assessment/Plan Bowel obstr surgery 04/14/12 Post op abscess Scheduled for drain placement  now Pt in dialysis now Pt aware of procedure benefits and risks and agreeable to proceed. Consent signed and in chart Troponin 1.9 (04/28/12)  Finbar Nippert A 04/30/2012, 9:26 AM

## 2012-04-30 NOTE — Progress Notes (Addendum)
[facility] Progress Note  Subjective:  Results of CT scan noted, likely  Intraperitoneal abscesses, for perc drains today (is there now).  Ran less than 3 hours of HD yesterday but did give unit of blood.  On HD this AM- order for blood but hgb this AM is in the low 8s  Objective Filed Vitals:   04/30/12 0650 04/30/12 0712 04/30/12 0745 04/30/12 0800  BP: 154/78 139/75 132/68 145/72  Pulse: 73 72 76 85  Temp: 97.4 F (36.3 C)     TempSrc: Oral     Resp: 12  13 14   Height:      Weight: 69 kg (152 lb 1.9 oz)     SpO2: 97%      Physical Exam General: seems more comfortable Heart: RRR Lungs: mostly clear Abdomen: distended, slightly painful.  I can appreciate bowel sounds Extremities: no peripheral edema Dialysis Access: left thigh AVGG patent; dressing intact with scant bleeding.  Dialysis Orders: Center: [facility] on TTS.  EDW 52.5 kg HD Bath 2K/2Ca Time 3.5 hrs Heparin 5000 U. Access AVG @ left thigh BFR 400 DFR 800 Zemplar 0 mcg IV/HD Epogen 0 Units IV/HD Venofer 50/week; last iPTH 314, but product high and no zemplar  Assessment/Plan:  1. SBO, s/p exp lap/SB resection 7/24 Dr. Luisa Hart, c/w dehiscence with peritonitis, back to OR 8/1 for further SB resection and reanastomosis. On IV zosyn and mycofungin.   Still with ileus/NGT per surgery. Repeat imaging shows abscess, for drainage today.  2. ESRD - HD normally  TTS at [facility]. temp R groin cath still in place, they are using for TPN , thigh graft s/p declot is functioning ok.  HD daily for a while to keep up with volume, patient is only tolerating for just over 2 hours at a time lately.  4 K bath.  3.  Hypertension/volume - was on no BP meds at home. BP has been variable here.  I stopped clonidine  yesterday and hemodynamics seem improved.  Keep metoprolol given sinus tach.   Steroids being weaned. Daily HD should keep in check.  Is hyponatremic due to volume overload, HD will help.   4. Anemia of CKD - Getting max Aranesp.  <  10; on full course of IV Fe; ferritin 552. PRN transfusions, received one unit yesterday, due for another unit today  5. Metabolic bone disease - No binders due to low phos being repleted.  Phos now within normal limits 6. Protein calorie malnutrition- Pre-albumin very low; on TNA 70cc/hr (1680 = any other fluids) today. Getting lipids MWF due to shortage; getting phos and K repletion today 7. S/p renal transplant - at [facility] in '87, started HD in 04/2003, remains on Prednisone.       9. Hepatitis C/thrombocytopenia - stable    Elen Acero A  04/30/2012      Additional Objective Labs: Basic Metabolic Panel:  Lab 04/30/12 1610 04/29/12 0710 04/28/12 0715  NA 129* 131* 130*  K 3.8 3.4* 3.1*  CL 94* 96 95*  CO2 23 22 20   GLUCOSE 180* 137* 106*  BUN 49* 60* 74*  CREATININE 3.64* 4.62* 5.60*  CALCIUM 8.9 9.1 9.4  ALB -- -- --  PHOS 3.3 2.2* 1.0*   Liver Function Tests:  Lab 04/29/12 0710 04/28/12 0715 04/26/12 0711 04/24/12 0411  AST 24 -- 16 25  ALT 14 -- 10 9  ALKPHOS 70 -- 60 55  BILITOT 1.2 -- 1.4* 1.2  PROT 4.8* -- 5.7* 5.4*  ALBUMIN  1.4* 1.5* 1.7* --   CBC:  Lab 04/30/12 0500 04/29/12 2051 04/29/12 0710 04/28/12 0715 04/26/12 0711  WBC PENDING 20.6* 23.6* -- --  NEUTROABS PENDING -- -- -- 12.3*  HGB 8.3* 7.4* 7.4* -- --  HCT 24.2* 22.3* 21.8* -- --  MCV 81.8 83.8 81.3 80.8 80.6  PLT 163 133* 139* -- --  Blood Culture    Component Value Date/Time   SDES BLOOD RIGHT ARM 04/28/2012 1548   SPECREQUEST BOTTLES DRAWN AEROBIC ONLY 5CC 04/28/2012 1548   CULT        BLOOD CULTURE RECEIVED NO GROWTH TO DATE CULTURE WILL BE HELD FOR 5 DAYS BEFORE ISSUING A FINAL NEGATIVE REPORT 04/28/2012 1548   REPTSTATUS PENDING 04/28/2012 1548  Cardiac Enzymes:  Lab 04/28/12 1100 04/28/12 0236 04/27/12 2130 04/27/12 1300 04/27/12 0525  CKTOTAL 46 32 22 24 23   CKMB 4.1* 4.8* 5.4* 7.1* 4.7*  CKMBINDEX -- -- -- -- --  TROPONINI 1.91* 2.14* 1.49* 2.32* 1.27*  CBG:  Lab 04/30/12 0055  04/29/12 2114 04/29/12 1630 04/29/12 1318 04/29/12 0826  GLUCAP 199* 191* 132* 129* 123*  Medications:    . fat emulsion 240 mL (04/28/12 1806)  . TPN (CLINIMIX) +/- additives 70 mL/hr at 04/29/12 1713  . DISCONTD: TPN (CLINIMIX) +/- additives 70 mL/hr at 04/28/12 1806      . antiseptic oral rinse  15 mL Mouth Rinse q12n4p  . darbepoetin (ARANESP) injection - DIALYSIS  200 mcg Intravenous Q Sat-HD  . fentaNYL  25 mcg Transdermal Q72H  . ferric gluconate (FERRLECIT/NULECIT) IV  125 mg Intravenous Q T,Th,Sa-HD  . hydrocortisone sod succinate (SOLU-CORTEF) injection  25 mg Intravenous Q12H  . insulin aspart  0-15 Units Subcutaneous Q4H  . lip balm   Topical BID  . metoprolol  5 mg Intravenous Q6H  . micafungin (MYCAMINE) IV  100 mg Intravenous Daily  . pantoprazole (PROTONIX) IV  40 mg Intravenous Q24H  . piperacillin-tazobactam (ZOSYN)  IV  2.25 g Intravenous Q8H  . potassium phosphate IVPB (mmol)  10 mmol Intravenous Once  . sodium chloride  10-40 mL Intracatheter Q12H  . sodium phosphate  1 enema Rectal BID  . vancomycin  1,500 mg Intravenous Once  . DISCONTD: hydrocortisone sodium succinate  50 mg Intravenous Daily

## 2012-04-30 NOTE — Progress Notes (Signed)
Nutrition Follow-up  Intervention:   1. Continue TPN per pharmacy 2. RD to continue to follow nutrition care plan   Assessment:   Pt scheduled for CT of abdomen with contrast last evening. Pt planning for perc drains today 2/2 intra-abdominal abscess.  RN reports pt was given enema however, pt did not have BM, only passed the enema.  Patient continues to receive TPN with Clinimix 5/15 @ 70 ml/hr. Lipids (20% IVFE @ 10 ml/hr), multivitamins, and trace elements are provided 3 times weekly (MWF) due to national backorder. Provides 1400 kcal and 84 grams protein daily (based on weekly average). Meets 82% minimum estimated kcal and 100% minimum estimated protein needs.  Diet Order:  NPO  Meds: Scheduled Meds:    . antiseptic oral rinse  15 mL Mouth Rinse q12n4p  . darbepoetin (ARANESP) injection - DIALYSIS  200 mcg Intravenous Q Sat-HD  . fentaNYL  25 mcg Transdermal Q72H  . ferric gluconate (FERRLECIT/NULECIT) IV  125 mg Intravenous Q T,Th,Sa-HD  . hydrocortisone sod succinate (SOLU-CORTEF) injection  25 mg Intravenous Q12H  . insulin aspart  0-15 Units Subcutaneous Q4H  . lip balm   Topical BID  . metoprolol  5 mg Intravenous Q6H  . micafungin (MYCAMINE) IV  100 mg Intravenous Daily  . pantoprazole (PROTONIX) IV  40 mg Intravenous Q24H  . piperacillin-tazobactam (ZOSYN)  IV  2.25 g Intravenous Q8H  . potassium phosphate IVPB (mmol)  10 mmol Intravenous Once  . sodium chloride  10-40 mL Intracatheter Q12H  . sodium phosphate  1 enema Rectal BID  . vancomycin  1,500 mg Intravenous Once  . DISCONTD: hydrocortisone sodium succinate  50 mg Intravenous Daily   Continuous Infusions:    . fat emulsion 240 mL (04/28/12 1806)  . TPN (CLINIMIX) +/- additives 70 mL/hr at 04/29/12 1713  . DISCONTD: TPN (CLINIMIX) +/- additives 70 mL/hr at 04/28/12 1806   PRN Meds:.acetaminophen, acetaminophen, albuterol, heparin, hydrALAZINE, iohexol, morphine injection, ondansetron (ZOFRAN) IV,  ondansetron, promethazine, sodium chloride  Labs:  CMP     Component Value Date/Time   NA 131* 04/29/2012 0710   K 3.4* 04/29/2012 0710   CL 96 04/29/2012 0710   CO2 22 04/29/2012 0710   GLUCOSE 137* 04/29/2012 0710   BUN 60* 04/29/2012 0710   CREATININE 4.62* 04/29/2012 0710   CALCIUM 9.1 04/29/2012 0710   PROT 4.8* 04/29/2012 0710   ALBUMIN 1.4* 04/29/2012 0710   AST 24 04/29/2012 0710   ALT 14 04/29/2012 0710   ALKPHOS 70 04/29/2012 0710   BILITOT 1.2 04/29/2012 0710   GFRNONAA 13* 04/29/2012 0710   GFRAA 15* 04/29/2012 0710   Phosphorus  Date/Time Value Range Status  04/29/2012  7:10 AM 2.2* 2.3 - 4.6 mg/dL Final  09/27/1094  0:45 AM 1.0* 2.3 - 4.6 mg/dL Final     CRITICAL RESULT CALLED TO, READ BACK BY AND VERIFIED WITH:     T CARBONE,RN AT 0820 04/28/12 BY KBARR  04/26/2012  7:11 AM 3.0  2.3 - 4.6 mg/dL Final   Potassium  Date/Time Value Range Status  04/29/2012  7:10 AM 3.4* 3.5 - 5.1 mEq/L Final  04/28/2012  7:15 AM 3.1* 3.5 - 5.1 mEq/L Final  04/28/2012  2:37 AM 4.1  3.5 - 5.1 mEq/L Final   Magnesium  Date/Time Value Range Status  04/29/2012  7:10 AM 1.6  1.5 - 2.5 mg/dL Final  4/0/9811  9:14 AM 1.9  1.5 - 2.5 mg/dL Final  03/30/2955  2:13 PM 1.8  1.5 -  2.5 mg/dL Final   Prealbumin  Date/Time Value Range Status  04/26/2012  7:11 AM 7.7* 17.0 - 34.0 mg/dL Final  1/61/0960  4:54 AM 9.5* 17.0 - 34.0 mg/dL Final  0/98/1191  4:78 AM 13.8* 17.0 - 34.0 mg/dL Final    Intake/Output Summary (Last 24 hours) at 04/30/12 2956 Last data filed at 04/30/12 0600  Gross per 24 hour  Intake   3246 ml  Output   1346 ml  Net   1900 ml  BM 8/1  Weight Status:  65.7 kg s/p HD on 8/8 - wt trending up 50.5 kg s/p HD on 8/6 50 kg s/p HD on 8/5 47.8 kg s/p HD on 7/30  48.3 kg s/p HD on 7/27  Estimated needs:  [calculated using the Kidney Disease Outcomes Quality Initiative (KDOQI) Adjustment Equation for the Underweight Patient]: 1700 - 1950 kcal, 80 - 100 grams protein daily  Nutrition Dx:  Inadequate oral intake now  R/T GI distress AEB pt report. Ongoing.  Goal:  Initiation of TPN; intake to meet at least 90% of estimated needs. Unmet.  Monitor:  Diet advancement, TPN adequacy, weights, labs, I/O's  Jarold Motto MS, RD, LDN Pager: [phone] After-hours pager: [phone]

## 2012-04-30 NOTE — Progress Notes (Signed)
ANTIBIOTIC CONSULT NOTE - FOLLOW UP  Pharmacy Consult for vancomycin Indication: Intra-abdominal abscess  Allergies  Allergen Reactions  . Allopurinol     REACTION: decreased platelets  . Aspirin     REACTION: unspecified    Patient Measurements: Height: 5\' 11"  (180.3 cm) Weight: 146 lb 6.2 oz (66.4 kg) IBW/kg (Calculated) : 75.3   Vital Signs: Temp: 98.4 F (36.9 C) (08/09 1046) Temp src: Oral (08/09 1046) BP: 113/59 mmHg (08/09 1200) Pulse Rate: 85  (08/09 1200) Intake/Output from previous day: 08/08 0701 - 08/09 0700 In: 3326 [Blood:350; IV Piggyback:1056; TPN:1720] Out: 1346 [Emesis/NG output:350] Intake/Output from this shift: Total I/O In: 60 [I.V.:60] Out: 2150 [Other:2150]  Labs:  [facility] 04/30/12 0500 04/29/12 2051 04/29/12 0710 04/28/12 0715  WBC 22.4* 20.6* 23.6* --  HGB 8.3* 7.4* 7.4* --  PLT 163 133* 139* --  LABCREA -- -- -- --  CREATININE 3.64* -- 4.62* 5.60*   Estimated Creatinine Clearance: 22.3 ml/min (by C-G formula based on Cr of 3.64). No results found for this basename: VANCOTROUGH:2,VANCOPEAK:2,VANCORANDOM:2,GENTTROUGH:2,GENTPEAK:2,GENTRANDOM:2,TOBRATROUGH:2,TOBRAPEAK:2,TOBRARND:2,AMIKACINPEAK:2,AMIKACINTROU:2,AMIKACIN:2, in the last 72 hours   Microbiology: Recent Results (from the past 720 hour(s))  MRSA PCR SCREENING     Status: Normal   Collection Time   04/03/12 10:10 AM      Component Value Range Status Comment   MRSA by PCR NEGATIVE  NEGATIVE Final   CULTURE, BLOOD (ROUTINE X 2)     Status: Normal (Preliminary result)   Collection Time   04/24/12 11:35 PM      Component Value Range Status Comment   Specimen Description BLOOD LEFT ARM   Final    Special Requests BOTTLES DRAWN AEROBIC ONLY 5CC   Final    Culture  Setup Time 04/25/2012 11:02   Final    Culture     Final    Value:        BLOOD CULTURE RECEIVED NO GROWTH TO DATE CULTURE WILL BE HELD FOR 5 DAYS BEFORE ISSUING A FINAL NEGATIVE REPORT   Report Status PENDING    Incomplete   CULTURE, BLOOD (ROUTINE X 2)     Status: Normal (Preliminary result)   Collection Time   04/24/12 11:40 PM      Component Value Range Status Comment   Specimen Description BLOOD LEFT HAND   Final    Special Requests BOTTLES DRAWN AEROBIC ONLY 5CC   Final    Culture  Setup Time 04/25/2012 11:02   Final    Culture     Final    Value:        BLOOD CULTURE RECEIVED NO GROWTH TO DATE CULTURE WILL BE HELD FOR 5 DAYS BEFORE ISSUING A FINAL NEGATIVE REPORT   Report Status PENDING   Incomplete   CULTURE, BLOOD (ROUTINE X 2)     Status: Normal (Preliminary result)   Collection Time   04/28/12  3:38 PM      Component Value Range Status Comment   Specimen Description BLOOD RIGHT HAND   Final    Special Requests BOTTLES DRAWN AEROBIC ONLY 5CC   Final    Culture  Setup Time 04/28/2012 22:13   Final    Culture     Final    Value:        BLOOD CULTURE RECEIVED NO GROWTH TO DATE CULTURE WILL BE HELD FOR 5 DAYS BEFORE ISSUING A FINAL NEGATIVE REPORT   Report Status PENDING   Incomplete   CULTURE, BLOOD (ROUTINE X 2)     Status: Normal (  Preliminary result)   Collection Time   04/28/12  3:48 PM      Component Value Range Status Comment   Specimen Description BLOOD RIGHT ARM   Final    Special Requests BOTTLES DRAWN AEROBIC ONLY 5CC   Final    Culture  Setup Time 04/28/2012 22:12   Final    Culture     Final    Value:        BLOOD CULTURE RECEIVED NO GROWTH TO DATE CULTURE WILL BE HELD FOR 5 DAYS BEFORE ISSUING A FINAL NEGATIVE REPORT   Report Status PENDING   Incomplete     Anti-infectives     Start     Dose/Rate Route Frequency Ordered Stop   04/29/12 2330   vancomycin (VANCOCIN) 1,500 mg in sodium chloride 0.9 % 500 mL IVPB        1,500 mg 250 mL/hr over 120 Minutes Intravenous  Once 04/29/12 2259 04/30/12 0246   04/27/12 0000   ceFAZolin (ANCEF) IVPB 1 g/50 mL premix  Status:  Discontinued     Comments: Send with pt to OR      1 g 100 mL/hr over 30 Minutes Intravenous On call  04/26/12 0927 04/26/12 0928   04/24/12 1000   micafungin (MYCAMINE) 100 mg in sodium chloride 0.9 % 100 mL IVPB        100 mg 100 mL/hr over 1 Hours Intravenous Daily 04/24/12 0750     04/23/12 1600   fluconazole (DIFLUCAN) IVPB 400 mg        400 mg 200 mL/hr over 60 Minutes Intravenous  Once 04/23/12 1428 04/23/12 1800   04/22/12 2200   piperacillin-tazobactam (ZOSYN) IVPB 3.375 g  Status:  Discontinued        3.375 g 100 mL/hr over 30 Minutes Intravenous 3 times per day 04/22/12 2120 04/22/12 2137   04/22/12 2200   piperacillin-tazobactam (ZOSYN) IVPB 2.25 g        2.25 g 100 mL/hr over 30 Minutes Intravenous 3 times per day 04/22/12 2140     04/22/12 2157   gentamicin (GARAMYCIN) injection  Status:  Discontinued          As needed 04/22/12 2158 04/22/12 2335   04/22/12 2156   clindamycin (CLEOCIN) injection  Status:  Discontinued          As needed 04/22/12 2157 04/22/12 2335   04/22/12 2145   piperacillin-tazobactam (ZOSYN) IVPB 3.375 g        3.375 g 12.5 mL/hr over 240 Minutes Intravenous  Once 04/22/12 2141 04/23/12 0145   04/22/12 2130   gentamicin (GARAMYCIN) 240 mg, clindamycin (CLEOCIN) 900 mg in sodium chloride irrigation 0.9 % 1,000 mL irrigation  Status:  Discontinued         Irrigation  Once 04/22/12 2119 04/23/12 0020   04/14/12 0830   ertapenem (INVANZ) 1 g in sodium chloride 0.9 % 50 mL IVPB        1 g 100 mL/hr over 30 Minutes Intravenous To Surgery 04/14/12 0816 04/14/12 0834   04/13/12 1359   ertapenem (INVANZ) 1 g in sodium chloride 0.9 % 50 mL IVPB  Status:  Discontinued        1 g 100 mL/hr over 30 Minutes Intravenous 60 min pre-op 04/13/12 1359 04/14/12 0816          Assessment: 51 yo male with ESRD with suspected peritonitis on zosyn and micafungin. CT abdomen/pelvis from today (8/8) showed multiple loculated fluid collections consistent with abscesses.  Pharmacy consulted to manage vancomycin now.   Patient tolerated 4hr of HD today, given  vancomycin 1500mg  IV x 1 last night.    8/1 Zosyn>> 8/2 Fluconazole>>8/3 8/3 micafungin  Goal of Therapy:  Pre-HD vancomycin level 15 - 25 mcg/mL  Plan:  1. Vancomycin 750mg  IV x 1 as patient received HD this am. Continue to follow HD schedule and redose as appropriate, check random am vancomycin levels as needed.   Dannielle Huh 04/30/2012,12:08 PM

## 2012-04-30 NOTE — Procedures (Signed)
Successful placement of a 10 French drain into the abscess/fluid collection within the right lower abdomen Approximately 200 mL of bloody, purulent fluid aspirated. Samples sent to lab as requested. No immediate post procedural complications.

## 2012-04-30 NOTE — Progress Notes (Signed)
*  PRELIMINARY RESULTS* Vascular Ultrasound Left lower extremity venous duplex has been completed.  Preliminary findings: Left= No evidence of acute DVT or baker's cyst. Could not evaluate common femoral vein due to groin bandages. Chronic DVT is seen in the popliteal vein. Chronic DVT and Rouleaux flow is seen in a single proximal posterior tibial vein.  Farrel Demark, RDMS, RVT  04/30/2012, 12:42 PM

## 2012-05-01 LAB — CBC
Hemoglobin: 10.1 g/dL — ABNORMAL LOW (ref 13.0–17.0)
MCHC: 35.1 g/dL (ref 30.0–36.0)
RDW: 18 % — ABNORMAL HIGH (ref 11.5–15.5)
WBC: 19.2 10*3/uL — ABNORMAL HIGH (ref 4.0–10.5)

## 2012-05-01 LAB — RENAL FUNCTION PANEL
Albumin: 1.5 g/dL — ABNORMAL LOW (ref 3.5–5.2)
Chloride: 93 mEq/L — ABNORMAL LOW (ref 96–112)
GFR calc Af Amer: 17 mL/min — ABNORMAL LOW (ref >90)
Phosphorus: 2.7 mg/dL (ref 2.3–4.6)
Potassium: 3.6 mEq/L (ref 3.5–5.1)
Sodium: 129 mEq/L — ABNORMAL LOW (ref 135–145)

## 2012-05-01 LAB — CULTURE, BLOOD (ROUTINE X 2): Culture: NO GROWTH

## 2012-05-01 LAB — GLUCOSE, CAPILLARY
Glucose-Capillary: 114 mg/dL — ABNORMAL HIGH (ref 70–99)
Glucose-Capillary: 151 mg/dL — ABNORMAL HIGH (ref 70–99)
Glucose-Capillary: 169 mg/dL — ABNORMAL HIGH (ref 70–99)

## 2012-05-01 LAB — BASIC METABOLIC PANEL
BUN: 54 mg/dL — ABNORMAL HIGH (ref 6–23)
Calcium: 9.3 mg/dL (ref 8.4–10.5)
Creatinine, Ser: 3.94 mg/dL — ABNORMAL HIGH (ref 0.50–1.35)
GFR calc Af Amer: 19 mL/min — ABNORMAL LOW (ref >90)
GFR calc non Af Amer: 16 mL/min — ABNORMAL LOW (ref >90)
Potassium: 3.6 mEq/L (ref 3.5–5.1)

## 2012-05-01 MED ORDER — BIOTENE DRY MOUTH MT LIQD
15.0000 mL | Freq: Two times a day (BID) | OROMUCOSAL | Status: DC
  Administered 2012-05-02 – 2012-06-05 (×53): 15 mL via OROMUCOSAL

## 2012-05-01 MED ORDER — CHLORHEXIDINE GLUCONATE 0.12 % MT SOLN
15.0000 mL | Freq: Two times a day (BID) | OROMUCOSAL | Status: DC
  Administered 2012-05-01 – 2012-05-27 (×38): 15 mL via OROMUCOSAL
  Filled 2012-05-01 (×56): qty 15

## 2012-05-01 MED ORDER — CLINIMIX/DEXTROSE (5/15) 5 % IV SOLN
INTRAVENOUS | Status: AC
  Administered 2012-05-01: 18:00:00 via INTRAVENOUS
  Filled 2012-05-01: qty 2000

## 2012-05-01 MED ORDER — CHLORHEXIDINE GLUCONATE 0.12 % MT SOLN: 15.0000 mL | Freq: Two times a day (BID) | OROMUCOSAL | Status: DC

## 2012-05-01 MED ORDER — MORPHINE SULFATE 2 MG/ML IJ SOLN
INTRAMUSCULAR | Status: AC
  Filled 2012-05-01: qty 1

## 2012-05-01 MED ORDER — DARBEPOETIN ALFA-POLYSORBATE 200 MCG/0.4ML IJ SOLN
INTRAMUSCULAR | Status: AC
  Administered 2012-05-01: 200 ug via INTRAVENOUS
  Filled 2012-05-01: qty 0.4

## 2012-05-01 NOTE — Progress Notes (Signed)
[facility] Progress Note  Subjective:  S/p drain to intraabd abscess (many GPC). Patient says "im all right now".  Continues to get hydrotherapy to his abdominal wound and daily HD.  Were able to get 2 liters off yest as is more hemodynamically stable.   Objective Filed Vitals:   05/01/12 0200 05/01/12 0446 05/01/12 0500 05/01/12 0600  BP: 137/92 134/62  134/67  Pulse: 71 71 67 69  Temp:  97.9 F (36.6 C)    TempSrc:  Oral    Resp: 13 13 12 14   Height:      Weight:      SpO2: 100% 100% 100% 100%   Physical Exam General: seems more comfortable Heart: RRR Lungs: mostly clear Abdomen: distended, slightly painful.  I can appreciate bowel sounds Extremities: no peripheral edema Dialysis Access: left thigh AVGG patent  Dialysis Orders: Center: [facility] on TTS.  EDW 52.5 kg HD Bath 2K/2Ca Time 3.5 hrs Heparin 5000 U. Access AVG @ left thigh BFR 400 DFR 800 Zemplar 0 mcg IV/HD Epogen 0 Units IV/HD Venofer 50/week; last iPTH 314, but product high and no zemplar   Assessment/Plan:  1. SBO, s/p exp lap/SB resection 7/24 Dr. Luisa Hart, c/w dehiscence with peritonitis, back to OR 8/1 for further SB resection and reanastomosis. Now s/p drainage of intraabd abcesses. On IV zosyn and mycofungin.   Still with ileus/NGT per surgery. Also needing hydrotherapy to open wound.  2. ESRD - HD normally  TTS at [facility]. temp R groin cath still in place, they are using for TPN , thigh graft s/p declot is functioning ok.  HD daily for a while to keep up with volume, patient is only tolerating for just over 2 hours at a time lately.  4 K bath. Plan for HD today but to get Sunday off 3.  Hypertension/volume - was on no BP meds at home. BP has been variable here.  I stopped clonidine and hemodynamics seem improved.  Keep metoprolol given sinus tach.   Steroids being weaned. Daily HD should keep in check.  Is hyponatremic due to volume overload, HD will help.   4. Anemia of CKD - Getting max Aranesp.  < 10;  on full course of IV Fe; ferritin 552. PRN transfusions, received 2 units over Thursday and Friday. Hgb now 10 ? 5. Metabolic bone disease - No binders due to low phos being repleted.  Phos now within normal limits 6. Protein calorie malnutrition- Pre-albumin very low; on TNA 70cc/hr (1680 = any other fluids) today. Getting lipids MWF due to shortage; getting phos and K repletion today 7. S/p renal transplant - at [facility] in '87, started HD in 04/2003, remains on Prednisone.       9. Hepatitis C/thrombocytopenia - stable  Patient is chronically and acutely ill at the moment.     Hailee Hollick A  05/01/2012      Additional Objective Labs: Basic Metabolic Panel:  Lab 05/01/12 1610 04/30/12 0724 04/30/12 0500 04/29/12 0710  NA 129* 132* 129* --  K 3.6 3.7 3.8 --  CL 94* 97 94* --  CO2 22 24 23  --  GLUCOSE 107* 150* 180* --  BUN 54* 35* 49* --  CREATININE 3.94* 2.79* 3.64* --  CALCIUM 9.3 8.9 8.9 --  ALB -- -- -- --  PHOS -- 2.7 3.3 2.2*   Liver Function Tests:  Lab 04/30/12 0724 04/29/12 0710 04/28/12 0715 04/26/12 0711  AST -- 24 -- 16  ALT -- 14 -- 10  ALKPHOS --  70 -- 60  BILITOT -- 1.2 -- 1.4*  PROT -- 4.8* -- 5.7*  ALBUMIN 1.6* 1.4* 1.5* --   CBC:  Lab 04/30/12 1352 04/30/12 0500 04/29/12 2051 04/29/12 0710 04/28/12 0715 04/26/12 0711  WBC 21.5* 22.4* 20.6* -- -- --  NEUTROABS -- 19.1* -- -- -- 12.3*  HGB 10.5* 8.3* 7.4* -- -- --  HCT 30.4* 24.2* 22.3* -- -- --  MCV 83.3 81.8 83.8 81.3 80.8 --  PLT 151 163 133* -- -- --  Blood Culture    Component Value Date/Time   SDES ABSCESS ABDOMEN 04/30/2012 1715   SDES ABSCESS ABDOMEN 04/30/2012 1715   SPECREQUEST 4 SYRINGES @ 60CC 04/30/2012 1715   SPECREQUEST 4 SYRINGES @ 60CC 04/30/2012 1715   CULT PENDING 04/30/2012 1715   CULT PENDING 04/30/2012 1715   REPTSTATUS PENDING 04/30/2012 1715   REPTSTATUS PENDING 04/30/2012 1715  Cardiac Enzymes:  Lab 04/28/12 1100 04/28/12 0236 04/27/12 2130 04/27/12 1300 04/27/12 0525    CKTOTAL 46 32 22 24 23   CKMB 4.1* 4.8* 5.4* 7.1* 4.7*  CKMBINDEX -- -- -- -- --  TROPONINI 1.91* 2.14* 1.49* 2.32* 1.27*  CBG:  Lab 05/01/12 0445 05/01/12 0019 04/30/12 1939 04/30/12 1722 04/30/12 1044  GLUCAP 114* 155* 98 145* 161*  Medications:    . fat emulsion 240 mL (04/30/12 1726)  . TPN (CLINIMIX) +/- additives 70 mL/hr at 04/29/12 1713  . TPN (CLINIMIX) +/- additives 70 mL/hr at 04/30/12 1727      . antiseptic oral rinse  15 mL Mouth Rinse q12n4p  . darbepoetin (ARANESP) injection - DIALYSIS  200 mcg Intravenous Q Sat-HD  . fentaNYL  25 mcg Transdermal Q72H  . fentaNYL      . ferric gluconate (FERRLECIT/NULECIT) IV  125 mg Intravenous Q T,Th,Sa-HD  . hydrocortisone sod succinate (SOLU-CORTEF) injection  25 mg Intravenous Q12H  . insulin aspart  0-15 Units Subcutaneous Q4H  . lip balm   Topical BID  . metoprolol  5 mg Intravenous Q6H  . micafungin (MYCAMINE) IV  100 mg Intravenous Daily  . midazolam      . pantoprazole (PROTONIX) IV  40 mg Intravenous Q24H  . piperacillin-tazobactam (ZOSYN)  IV  2.25 g Intravenous Q8H  . sodium chloride  10-40 mL Intracatheter Q12H  . sodium phosphate  1 enema Rectal BID  . DISCONTD: vancomycin  750 mg Intravenous Once

## 2012-05-01 NOTE — Progress Notes (Signed)
4 Days Post-Op  Subjective: Pt just back from HD; doing ok; feels little better since drainage procedure 8/9  Objective: Vital signs in last 24 hours: Temp:  [97.3 F (36.3 C)-98.6 F (37 C)] 98.6 F (37 C) (08/10 1402) Pulse Rate:  [67-121] 102  (08/10 1320) Resp:  [9-25] 15  (08/10 1320) BP: (123-161)/(60-92) 161/82 mmHg (08/10 1320) SpO2:  [98 %-100 %] 99 % (08/10 1320) Weight:  [139 lb 12.4 oz (63.4 kg)-154 lb 5.2 oz (70 kg)] 139 lb 12.4 oz (63.4 kg) (08/10 1320) Last BM Date: 04/30/12  Intake/Output from previous day: 08/09 0701 - 08/10 0700 In: 2370 [P.O.:240; I.V.:60; IV Piggyback:150; TPN:1920] Out: 2940 [Emesis/NG output:550; Drains:240] Intake/Output this shift: Total I/O In: 80 [TPN:80] Out: 2267 [Other:2267]  RLQ drain intact, output 240 cc's today purulent blood tinged fluid, cx's pend, insertion site ok, mildly tender  Lab Results:   Basename 05/01/12 1002 04/30/12 1352  WBC 19.2* 21.5*  HGB 10.1* 10.5*  HCT 28.8* 30.4*  PLT 180 151   BMET  Basename 05/01/12 1002 05/01/12 0425  NA 129* 129*  K 3.6 3.6  CL 93* 94*  CO2 21 22  GLUCOSE 120* 107*  BUN 60* 54*  CREATININE 4.31* 3.94*  CALCIUM 9.4 9.3   PT/INR  Basename 04/30/12 1354  LABPROT 14.9  INR 1.15   Results for orders placed during the hospital encounter of 04/02/12  MRSA PCR SCREENING     Status: Normal   Collection Time   04/03/12 10:10 AM      Component Value Range Status Comment   MRSA by PCR NEGATIVE  NEGATIVE Final   CULTURE, BLOOD (ROUTINE X 2)     Status: Normal   Collection Time   04/24/12 11:35 PM      Component Value Range Status Comment   Specimen Description BLOOD LEFT ARM   Final    Special Requests BOTTLES DRAWN AEROBIC ONLY 5CC   Final    Culture  Setup Time 04/25/2012 11:02   Final    Culture NO GROWTH 5 DAYS   Final    Report Status 05/01/2012 FINAL   Final   CULTURE, BLOOD (ROUTINE X 2)     Status: Normal   Collection Time   04/24/12 11:40 PM      Component Value  Range Status Comment   Specimen Description BLOOD LEFT HAND   Final    Special Requests BOTTLES DRAWN AEROBIC ONLY 5CC   Final    Culture  Setup Time 04/25/2012 11:02   Final    Culture NO GROWTH 5 DAYS   Final    Report Status 05/01/2012 FINAL   Final   CULTURE, BLOOD (ROUTINE X 2)     Status: Normal (Preliminary result)   Collection Time   04/28/12  3:38 PM      Component Value Range Status Comment   Specimen Description BLOOD RIGHT HAND   Final    Special Requests BOTTLES DRAWN AEROBIC ONLY 5CC   Final    Culture  Setup Time 04/28/2012 22:13   Final    Culture     Final    Value:        BLOOD CULTURE RECEIVED NO GROWTH TO DATE CULTURE WILL BE HELD FOR 5 DAYS BEFORE ISSUING A FINAL NEGATIVE REPORT   Report Status PENDING   Incomplete   CULTURE, BLOOD (ROUTINE X 2)     Status: Normal (Preliminary result)   Collection Time   04/28/12  3:48 PM  Component Value Range Status Comment   Specimen Description BLOOD RIGHT ARM   Final    Special Requests BOTTLES DRAWN AEROBIC ONLY 5CC   Final    Culture  Setup Time 04/28/2012 22:12   Final    Culture     Final    Value:        BLOOD CULTURE RECEIVED NO GROWTH TO DATE CULTURE WILL BE HELD FOR 5 DAYS BEFORE ISSUING A FINAL NEGATIVE REPORT   Report Status PENDING   Incomplete   ANAEROBIC CULTURE     Status: Normal (Preliminary result)   Collection Time   04/30/12  5:15 PM      Component Value Range Status Comment   Specimen Description ABSCESS ABDOMEN   Final    Special Requests 4 SYRINGES @ 60CC   Final    Gram Stain     Final    Value: ABUNDANT WBC PRESENT, PREDOMINANTLY PMN     NO SQUAMOUS EPITHELIAL CELLS SEEN     ABUNDANT GRAM POSITIVE COCCI IN PAIRS     IN CLUSTERS   Culture     Final    Value: NO ANAEROBES ISOLATED; CULTURE IN PROGRESS FOR 5 DAYS   Report Status PENDING   Incomplete   CULTURE, ROUTINE-ABSCESS     Status: Normal (Preliminary result)   Collection Time   04/30/12  5:15 PM      Component Value Range Status Comment    Specimen Description ABSCESS ABDOMEN   Final    Special Requests 4 SYRINGES @ 60CC   Final    Gram Stain     Final    Value: ABUNDANT WBC PRESENT, PREDOMINANTLY PMN     NO SQUAMOUS EPITHELIAL CELLS SEEN     ABUNDANT GRAM POSITIVE COCCI IN PAIRS     IN CLUSTERS   Culture NO GROWTH   Final    Report Status PENDING   Incomplete     ABG No results found for this basename: PHART:2,PCO2:2,PO2:2,HCO3:2 in the last 72 hours  Studies/Results: Ct Guided Abscess Drain  04/30/2012  *RADIOLOGY REPORT*  Indication: Post surgery for small bowel obstruction, now with anastomotic leak and large intra-abdominal abscess.  CT GUIDED RIGHT LOWER QUADRANT ABDOMINAL DRAINAGE CATHETER PLACEMENT  Comparison: CT abdomen pelvis - 04/29/2012  Medications: Fentanyl 75 mcg IV; Versed 1.5 mg IV  Total Moderate Sedation time: 20 minutes  Contrast: None  Complications: None immediate  Technique / Findings:  Informed written consent was obtained from the patient after a discussion of the risks, benefits and alternatives to treatment. The patient was placed supine on the CT gantry and a pre procedural CT was performed re-demonstrating the known abscess/fluid collection within the right lower abdominal quadrant.  The procedure was planned.   A timeout was performed prior to the initiation of the procedure.  The skin overlying the right lower abdomen was prepped and draped in the usual sterile fashion.   The overlying soft tissues were anesthetized with 1% lidocaine with epinephrine.  An 18 gauge trocar needle was advanced in to the abscess/fluid collection and a short Amplatz super stiff wire was coiled within the abscess/fluid collection.   Appropriate positioning was confirmed with a limited CT scan.  The tract was serially dilated allowing placement of a 10 [place] all-purpose drainage catheter.  Appropriate positioning was confirmed with a limited postprocedural CT scan.  200 ml of bloody, slightly purulent fluid was aspirated.  The  tube was connected to a drainage bag and sutured in place.  A dressing was placed.  The patient tolerated the procedure well without immediate post procedural complication.  Impression:  Successful CT guided placement of a 10 [place] all purpose drain catheter into the right lower abdomen with aspiration of 200 mL of bloody, slightly purulent fluid.  Samples were sent to the laboratory as requested by the ordering clinical team.  Original Report Authenticated By: Waynard Reeds, M.D.   Ct Abdomen Pelvis W Contrast  04/29/2012  *RADIOLOGY REPORT*  Clinical Data: Post surgery for small bowel obstruction in an anastomotic leak.  Leukocytosis.  Dialysis patient.  CT ABDOMEN AND PELVIS WITH CONTRAST  Technique:  Multidetector CT imaging of the abdomen and pelvis was performed following the standard protocol during bolus administration of intravenous contrast.  Contrast: OMNIPAQUE IOHEXOL 300 MG/ML  SOLN  Comparison: 04/03/2012  Findings: New small left pleural effusion with basilar atelectasis or consolidation on the left.  Enteric tube with tip in the distal stomach.  The liver and spleen appear homogeneous without enlargement.  Portal and mesenteric vessels appear patent.  The gallbladder is moderately distended and is filled with increased density material suggesting vicarious contrast excretion. Pancreatic duct is diffusely dilated as seen previously.  No obstructing mass or stone is visualized.  Consider MRCP for further evaluation.  Extensive calcification of the abdominal aorta, iliac vessels, and splenic artery.  Calcification of the mesenteric artery with flow demonstrated.  Left adrenal gland nodule measuring 14 mm diameter.  This appears stable since the previous study but density measures are indeterminate.  No significant retroperitoneal lymphadenopathy.  Bilateral renal atrophy with multiple bilateral renal cysts.  No hydronephrosis.  The stomach, small bowel, and colon are decompressed.  No  residual small bowel obstruction. Interval postoperative changes with midline surgical defect along the anterior abdominal wall and skin clips in the left groin region.  There is interval development of a large gas and fluid collections in the right pelvis extending from anteriorly to posteriorly and extending up into the right lower quadrant.  This collection measures up to about 16.5 x 13.7 x 6.4 cm.  The collection has a mildly thickened enhancing wall and air-fluid levels are present.  The appearance is consistent with loculated fluid or abscess.  There are additional loculated fluid collections in the anterior right lower quadrant measuring 3.3 x .9 centimeters and between the medial ascending colon and psoas muscle measuring 2.6 x 2.6 cm.  Sulci are consistent with abscesses.  Probably loculated fluid collection at the tip of the liver inferiorly. Infiltration into the mesenteric fat in the upper abdomen around the spleen and liver may represent edema.  No loculation is apparent.  Pelvis:  Pelvic transplant kidney with diffuse atrophy and prominent renal sinus fat.  No hydronephrosis.  Vascular calcification in the graft.  There  is a vascular graft extending from the common femoral vein inferiorly and from the common femoral artery laterally on the left.  Extensive calcification of the native femoral arteries bilaterally with suggestion of significant stenosis of the right common femoral artery.  Edema in the subcutaneous soft tissues particularly on the left.  Normal alignment of the lumbar vertebra.  IMPRESSION: Interval postoperative changes.  No residual bowel obstruction. Multiple loculated fluid collections including a very large right abdominal and pelvic collection consistent with abscesses. Vicarious contrast excretion into the gallbladder.  New left pleural effusion and basilar atelectasis.  Additional incidental findings are stable since the previous study.  Results were telephoned to and [facility],  the patient's  nurse on [facility]- [id number] at the time of dictation, 2129 hours on 04/29/2012.  Original Report Authenticated By: Marlon Pel, M.D.    Anti-infectives: Anti-infectives     Start     Dose/Rate Route Frequency Ordered Stop   04/30/12 1400   vancomycin (VANCOCIN) 750 mg in sodium chloride 0.9 % 150 mL IVPB  Status:  Discontinued        750 mg 150 mL/hr over 60 Minutes Intravenous  Once 04/30/12 1212 04/30/12 1507   04/29/12 2330   vancomycin (VANCOCIN) 1,500 mg in sodium chloride 0.9 % 500 mL IVPB        1,500 mg 250 mL/hr over 120 Minutes Intravenous  Once 04/29/12 2259 04/30/12 0246   04/27/12 0000   ceFAZolin (ANCEF) IVPB 1 g/50 mL premix  Status:  Discontinued     Comments: Send with pt to OR      1 g 100 mL/hr over 30 Minutes Intravenous On call 04/26/12 0927 04/26/12 0928   04/24/12 1000   micafungin (MYCAMINE) 100 mg in sodium chloride 0.9 % 100 mL IVPB        100 mg 100 mL/hr over 1 Hours Intravenous Daily 04/24/12 0750     04/23/12 1600   fluconazole (DIFLUCAN) IVPB 400 mg        400 mg 200 mL/hr over 60 Minutes Intravenous  Once 04/23/12 1428 04/23/12 1800   04/22/12 2200   piperacillin-tazobactam (ZOSYN) IVPB 3.375 g  Status:  Discontinued        3.375 g 100 mL/hr over 30 Minutes Intravenous 3 times per day 04/22/12 2120 04/22/12 2137   04/22/12 2200   piperacillin-tazobactam (ZOSYN) IVPB 2.25 g        2.25 g 100 mL/hr over 30 Minutes Intravenous 3 times per day 04/22/12 2140     04/22/12 2157   gentamicin (GARAMYCIN) injection  Status:  Discontinued          As needed 04/22/12 2158 04/22/12 2335   04/22/12 2156   clindamycin (CLEOCIN) injection  Status:  Discontinued          As needed 04/22/12 2157 04/22/12 2335   04/22/12 2145   piperacillin-tazobactam (ZOSYN) IVPB 3.375 g        3.375 g 12.5 mL/hr over 240 Minutes Intravenous  Once 04/22/12 2141 04/23/12 0145   04/22/12 2130   gentamicin (GARAMYCIN) 240 mg, clindamycin (CLEOCIN) 900 mg in sodium  chloride irrigation 0.9 % 1,000 mL irrigation  Status:  Discontinued         Irrigation  Once 04/22/12 2119 04/23/12 0020   04/14/12 0830   ertapenem (INVANZ) 1 g in sodium chloride 0.9 % 50 mL IVPB        1 g 100 mL/hr over 30 Minutes Intravenous To Surgery 04/14/12 0816 04/14/12 0834   04/13/12 1359   ertapenem (INVANZ) 1 g in sodium chloride 0.9 % 50 mL IVPB  Status:  Discontinued        1 g 100 mL/hr over 30 Minutes Intravenous 60 min pre-op 04/13/12 1359 04/14/12 0816          Assessment/Plan: s/p RLQ abscess drainage 8/9; check final cx's, monitor labs, check f/u CT with poss drain injection next week     LOS: 29 days    [name],D [facility] 05/01/2012

## 2012-05-01 NOTE — Progress Notes (Signed)
Hydrotherapy Note:  Pt in HD this am and just arriving back to room.  Arrived for Hydrotherapy treatment with RN reporting just changing abdominal dressing with wound bed looking good today.  Will hold hydrotherapy today with RN to perform dressing changes again tomorrow.  Hydrotherapy to return Monday 05/03/12.  Thanks.  05/01/2012 Cephus Shelling, PT, DPT [phone]

## 2012-05-01 NOTE — Progress Notes (Signed)
4 Days Post-Op  Subjective: Just back from hd, tired, no flatus/bm less ab pain since drain in  Objective: Vital signs in last 24 hours: Temp:  [97.3 F (36.3 C)-98.6 F (37 C)] 98.6 F (37 C) (08/10 1402) Pulse Rate:  [67-121] 102  (08/10 1320) Resp:  [9-25] 15  (08/10 1320) BP: (123-161)/(60-92) 161/82 mmHg (08/10 1320) SpO2:  [98 %-100 %] 99 % (08/10 1320) Weight:  [139 lb 12.4 oz (63.4 kg)-154 lb 5.2 oz (70 kg)] 139 lb 12.4 oz (63.4 kg) (08/10 1320) Last BM Date: 04/30/12  Intake/Output from previous day: 08/09 0701 - 08/10 0700 In: 2370 [P.O.:240; I.V.:60; IV Piggyback:150; TPN:1920] Out: 2940 [Emesis/NG output:550; Drains:240] Intake/Output this shift: Total I/O In: 80 [TPN:80] Out: 2267 [Other:2267]  GI: wound clean, abdomen flat approp tender, no real bs  Lab Results:   Basename 05/01/12 1002 04/30/12 1352  WBC 19.2* 21.5*  HGB 10.1* 10.5*  HCT 28.8* 30.4*  PLT 180 151   BMET  Basename 05/01/12 1002 05/01/12 0425  NA 129* 129*  K 3.6 3.6  CL 93* 94*  CO2 21 22  GLUCOSE 120* 107*  BUN 60* 54*  CREATININE 4.31* 3.94*  CALCIUM 9.4 9.3   PT/INR  Basename 04/30/12 1354  LABPROT 14.9  INR 1.15   ABG No results found for this basename: PHART:2,PCO2:2,PO2:2,HCO3:2 in the last 72 hours  Studies/Results: Ct Guided Abscess Drain  04/30/2012  *RADIOLOGY REPORT*  Indication: Post surgery for small bowel obstruction, now with anastomotic leak and large intra-abdominal abscess.  CT GUIDED RIGHT LOWER QUADRANT ABDOMINAL DRAINAGE CATHETER PLACEMENT  Comparison: CT abdomen pelvis - 04/29/2012  Medications: Fentanyl 75 mcg IV; Versed 1.5 mg IV  Total Moderate Sedation time: 20 minutes  Contrast: None  Complications: None immediate  Technique / Findings:  Informed written consent was obtained from the patient after a discussion of the risks, benefits and alternatives to treatment. The patient was placed supine on the CT gantry and a pre procedural CT was performed  re-demonstrating the known abscess/fluid collection within the right lower abdominal quadrant.  The procedure was planned.   A timeout was performed prior to the initiation of the procedure.  The skin overlying the right lower abdomen was prepped and draped in the usual sterile fashion.   The overlying soft tissues were anesthetized with 1% lidocaine with epinephrine.  An 18 gauge trocar needle was advanced in to the abscess/fluid collection and a short Amplatz super stiff wire was coiled within the abscess/fluid collection.   Appropriate positioning was confirmed with a limited CT scan.  The tract was serially dilated allowing placement of a 10 [place] all-purpose drainage catheter.  Appropriate positioning was confirmed with a limited postprocedural CT scan.  200 ml of bloody, slightly purulent fluid was aspirated.  The tube was connected to a drainage bag and sutured in place.  A dressing was placed.  The patient tolerated the procedure well without immediate post procedural complication.  Impression:  Successful CT guided placement of a 10 [place] all purpose drain catheter into the right lower abdomen with aspiration of 200 mL of bloody, slightly purulent fluid.  Samples were sent to the laboratory as requested by the ordering clinical team.  Original Report Authenticated By: Waynard Reeds, M.D.   Ct Abdomen Pelvis W Contrast  04/29/2012  *RADIOLOGY REPORT*  Clinical Data: Post surgery for small bowel obstruction in an anastomotic leak.  Leukocytosis.  Dialysis patient.  CT ABDOMEN AND PELVIS WITH CONTRAST  Technique:  Multidetector CT imaging of the abdomen and pelvis was performed following the standard protocol during bolus administration of intravenous contrast.  Contrast: OMNIPAQUE IOHEXOL 300 MG/ML  SOLN  Comparison: 04/03/2012  Findings: New small left pleural effusion with basilar atelectasis or consolidation on the left.  Enteric tube with tip in the distal stomach.  The liver and spleen appear  homogeneous without enlargement.  Portal and mesenteric vessels appear patent.  The gallbladder is moderately distended and is filled with increased density material suggesting vicarious contrast excretion. Pancreatic duct is diffusely dilated as seen previously.  No obstructing mass or stone is visualized.  Consider MRCP for further evaluation.  Extensive calcification of the abdominal aorta, iliac vessels, and splenic artery.  Calcification of the mesenteric artery with flow demonstrated.  Left adrenal gland nodule measuring 14 mm diameter.  This appears stable since the previous study but density measures are indeterminate.  No significant retroperitoneal lymphadenopathy.  Bilateral renal atrophy with multiple bilateral renal cysts.  No hydronephrosis.  The stomach, small bowel, and colon are decompressed.  No residual small bowel obstruction. Interval postoperative changes with midline surgical defect along the anterior abdominal wall and skin clips in the left groin region.  There is interval development of a large gas and fluid collections in the right pelvis extending from anteriorly to posteriorly and extending up into the right lower quadrant.  This collection measures up to about 16.5 x 13.7 x 6.4 cm.  The collection has a mildly thickened enhancing wall and air-fluid levels are present.  The appearance is consistent with loculated fluid or abscess.  There are additional loculated fluid collections in the anterior right lower quadrant measuring 3.3 x .9 centimeters and between the medial ascending colon and psoas muscle measuring 2.6 x 2.6 cm.  Sulci are consistent with abscesses.  Probably loculated fluid collection at the tip of the liver inferiorly. Infiltration into the mesenteric fat in the upper abdomen around the spleen and liver may represent edema.  No loculation is apparent.  Pelvis:  Pelvic transplant kidney with diffuse atrophy and prominent renal sinus fat.  No hydronephrosis.  Vascular  calcification in the graft.  There  is a vascular graft extending from the common femoral vein inferiorly and from the common femoral artery laterally on the left.  Extensive calcification of the native femoral arteries bilaterally with suggestion of significant stenosis of the right common femoral artery.  Edema in the subcutaneous soft tissues particularly on the left.  Normal alignment of the lumbar vertebra.  IMPRESSION: Interval postoperative changes.  No residual bowel obstruction. Multiple loculated fluid collections including a very large right abdominal and pelvic collection consistent with abscesses. Vicarious contrast excretion into the gallbladder.  New left pleural effusion and basilar atelectasis.  Additional incidental findings are stable since the previous study.  Results were telephoned to and Marchelle Folks, the patient's nurse on [facility]- 2600 at the time of dictation, 2129 hours on 04/29/2012.  Original Report Authenticated By: Marlon Pel, M.D.    Anti-infectives: Anti-infectives     Start     Dose/Rate Route Frequency Ordered Stop   04/30/12 1400   vancomycin (VANCOCIN) 750 mg in sodium chloride 0.9 % 150 mL IVPB  Status:  Discontinued        750 mg 150 mL/hr over 60 Minutes Intravenous  Once 04/30/12 1212 04/30/12 1507   04/29/12 2330   vancomycin (VANCOCIN) 1,500 mg in sodium chloride 0.9 % 500 mL IVPB        1,500  mg 250 mL/hr over 120 Minutes Intravenous  Once 04/29/12 2259 04/30/12 0246   04/27/12 0000   ceFAZolin (ANCEF) IVPB 1 g/50 mL premix  Status:  Discontinued     Comments: Send with pt to OR      1 g 100 mL/hr over 30 Minutes Intravenous On call 04/26/12 0927 04/26/12 0928   04/24/12 1000   micafungin (MYCAMINE) 100 mg in sodium chloride 0.9 % 100 mL IVPB        100 mg 100 mL/hr over 1 Hours Intravenous Daily 04/24/12 0750     04/23/12 1600   fluconazole (DIFLUCAN) IVPB 400 mg        400 mg 200 mL/hr over 60 Minutes Intravenous  Once 04/23/12 1428 04/23/12 1800     04/22/12 2200   piperacillin-tazobactam (ZOSYN) IVPB 3.375 g  Status:  Discontinued        3.375 g 100 mL/hr over 30 Minutes Intravenous 3 times per day 04/22/12 2120 04/22/12 2137   04/22/12 2200   piperacillin-tazobactam (ZOSYN) IVPB 2.25 g        2.25 g 100 mL/hr over 30 Minutes Intravenous 3 times per day 04/22/12 2140     04/22/12 2157   gentamicin (GARAMYCIN) injection  Status:  Discontinued          As needed 04/22/12 2158 04/22/12 2335   04/22/12 2156   clindamycin (CLEOCIN) injection  Status:  Discontinued          As needed 04/22/12 2157 04/22/12 2335   04/22/12 2145   piperacillin-tazobactam (ZOSYN) IVPB 3.375 g        3.375 g 12.5 mL/hr over 240 Minutes Intravenous  Once 04/22/12 2141 04/23/12 0145   04/22/12 2130   gentamicin (GARAMYCIN) 240 mg, clindamycin (CLEOCIN) 900 mg in sodium chloride irrigation 0.9 % 1,000 mL irrigation  Status:  Discontinued         Irrigation  Once 04/22/12 2119 04/23/12 0020   04/14/12 0830   ertapenem (INVANZ) 1 g in sodium chloride 0.9 % 50 mL IVPB        1 g 100 mL/hr over 30 Minutes Intravenous To Surgery 04/14/12 0816 04/14/12 0834   04/13/12 1359   ertapenem (INVANZ) 1 g in sodium chloride 0.9 % 50 mL IVPB  Status:  Discontinued        1 g 100 mL/hr over 30 Minutes Intravenous 60 min pre-op 04/13/12 1359 04/14/12 0816          Assessment/Plan: ia abscess s/p sbr  Continue abx, follow drain, continue ng/tna for now   LOS: 29 days    [facility] 05/01/2012

## 2012-05-01 NOTE — Progress Notes (Signed)
Pt is educated that even though he is on the continuous rotating bed, I still need to assist him turn or he can turn himself every 2 hours. Pt refuses at times to be turned. Will continue to assist pt with his turning/mobility needs.

## 2012-05-01 NOTE — Progress Notes (Signed)
[facility] PROGRESS NOTE  [name] XBJ:[id number] DOB: Sep 02, 1961 DOA: 04/02/2012 PCP: Trevor Iha, MD  Brief narrative: 51 y/o male with ESRD and Prostatectomy in 2012. One day prior to admission he started having abdominal pain which progressively got worse - mainly periumbilical - nothing made it better or worse - had no radiation.  He started vomiting about 3 or 4 times and decided to come to the emergency room.  In the emergency room a CT scan of the abdomen and pelvis was done that showed small bowel obstruction.On 7/24 he underwent lysis of adhesions and partial small bowel resection (iliectomy and partial jejunectomy).Unfortunately on 7//25 he developed a clot in his L femoral AV graft requiring thrombectomy. Small bowel obstruction/ileus symptoms persisted and he subsequently went back to OR on 8/2 for what was found to be a 3cm anastomotic leak with peritonitis. Because of anticipated hemodynamic instability in a malnourished immunocompromised renal patient he was sent to the step down unit for further evaluation and monitoring.  Assessment/Plan:  SBO (small bowel obstruction) s/p lysis of adhesions and partial small bowel resection 7/24 *Subsequent anastomosis dehiscence / SBO s/p lysis of adhesions, small bowel resection, omental patch of anastomosis 8/2  *CT abdomen and pelvis 04/29/2012 demonstrated several fluid collections consistent with intra-abdominal abscess with the largest measuring 16.5 x 13.7 x 6.4 cm.  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  *As above - anticipated protracted postoperative course  Constipation with fecal impaction *Surgeon attempted to disimpact 04/29/2012 but unfortunately did not tolerate enemas  Atypical chest pain/abnormal cardiac enzyme *Had episode of severe chest pain during this hospitalization. CT of the chest revealed no evidence of pulmonary embolism or aortic dissection *Suspect mildly elevated cardiac isoenzymes related to demand ischemia versus recent stressors of recurrent surgical procedures *Multiple EKGs have been performed since admission including during episode of severe pain on Saturday evening and showed no definitive ischemic changes  CKD (chronic kidney disease) stage V requiring chronic dialysis *Dialysis per nephrology  Hypokalemia/hypophosphatemia *Management per nephrology service/TNA  Thrombocytopenia *Recurrent this admission x 2 episodes  Normocytic Anemia  *Due to acute blood loss and due to chronic disease *Receive packed red blood cells on 04/25/2012 *Cont aranesp and ferric gluconate  HTN (hypertension) Continue to follow without change they  Chronic use of steroids On very slow steroid taper  Malnutrition, calorie *Patient cachectic at presentation  *Now with acute malnutrition related  to postoperative ileus and surgical complications *Parenteral nutrition managed by surgery and pharmacy  Leg dialysis graft occlusion *s/p thrombectomy 7/25 - unfortunately re clotted-has undergone an additional thrombectomy procedure with revision  of arterial and graft on 04/27/2012 *Currently being dialyzed via groin HD catheter- VVS following  DVT prophylaxis: SCDs Code Status: full  Disposition Plan: Remain in step down  Consultants: General Surgery [facility] Infectious disease  Events since admission: 7/13 CT Abdomen with SBO  7/24 Ex-lap, lysis of adhesions, partial small bowel resection x2  7/25 Thrombectomy L femoral AV graft  7/26 Extubated, CVL is out  7/27 Transferred to telemetry under [facility]  8/2 Acute abdomen, dehiscence of anastomosis, SBO, back from OR intubated in ICU on PCCM service. Found to have moderate peritonitis infraumbilically 8/2 Extubated  Antibiotics: Zosyn 8/1 >>> Micafungin 8/3 >> Invanz 7/24- periop Fluconazole- 8/2- one dose Vancomycin 8/8 >>> 05/01/2012  HPI/Subjective: Patient reports that his pain control is improved.  He denies chest pain shortness of breath nausea or vomiting.  He does continue to have abdominal pain but states it is much less significant than yesterday.  Objective: Filed Vitals:   05/01/12 1300 05/01/12 1320 05/01/12 1400 05/01/12 1402  BP: 148/70 161/82 140/59   Pulse: 121 102 104   Temp:  97.7 F (36.5 C)  98.6 F (37 C)  TempSrc:  Oral  Oral  Resp:  15 18   Height:      Weight:  63.4 kg (139 lb 12.4 oz)    SpO2:  99% 98%     Intake/Output Summary (Last 24 hours) at 05/01/12 1546 Last data filed at 05/01/12 1419  Gross per 24 hour  Intake   1950 ml  Output   3057 ml  Net  -1107 ml    Exam:   General:  Alert male, cachectic-appearing, in no acute distress  Cardiovascular: Sinus rhythm, S1-S2, peripheral pulses palpable, no peripheral edema  Respiratory: Clear to auscultation throughout without  wheeze  Abdomen: Midline dressing in place- bs negative, non-distended but is tender around the wound  Musculoskeletal: No significant lower extremity edema today  Neurological: Patient is alert and oriented  Data Reviewed: Basic Metabolic Panel:  Lab 05/01/12 1610 05/01/12 0425 04/30/12 0724 04/30/12 0500 04/29/12 0710 04/28/12 0715 04/26/12 0711 04/24/12 2045  NA 129* 129* 132* 129* 131* -- -- --  K 3.6 3.6 3.7 3.8 3.4* -- -- --  CL 93* 94* 97 94* 96 -- -- --  CO2 21 22 24 23 22  -- -- --  GLUCOSE 120* 107* 150* 180* 137* -- -- --  BUN 60* 54* 35* 49* 60* -- -- --  CREATININE 4.31* 3.94* 2.79* 3.64* 4.62* -- -- --  CALCIUM 9.4 9.3 8.9 8.9 9.1 -- -- --  MG -- -- -- -- 1.6 -- 1.9 1.8  PHOS 2.7 -- 2.7 3.3 2.2* 1.0* -- --   Liver Function Tests:  Lab 05/01/12 1002 04/30/12 0724 04/29/12 0710 04/28/12 0715 04/26/12 0711  AST -- -- 24 -- 16  ALT -- -- 14 -- 10  ALKPHOS -- -- 70 -- 60  BILITOT -- -- 1.2 -- 1.4*  PROT -- -- 4.8* -- 5.7*  ALBUMIN 1.5* 1.6* 1.4* 1.5* 1.7*   CBC:  Lab 05/01/12 1002 04/30/12 1352 04/30/12 0500 04/29/12 2051 04/29/12 0710 04/26/12 0711  WBC 19.2* 21.5* 22.4* 20.6* 23.6* --  NEUTROABS -- -- 19.1* -- -- 12.3*  HGB 10.1* 10.5* 8.3* 7.4* 7.4* --  HCT 28.8* 30.4* 24.2* 22.3* 21.8* --  MCV 84.0 83.3 81.8 83.8 81.3 --  PLT 180 151 163 133* 139* --   Cardiac Enzymes:  Lab 04/28/12 1100 04/28/12 0236 04/27/12 2130 04/27/12 1300 04/27/12 0525  CKTOTAL 46 32 22 24 23   CKMB 4.1* 4.8* 5.4* 7.1* 4.7*  CKMBINDEX -- -- -- -- --  TROPONINI 1.91* 2.14* 1.49* 2.32* 1.27*   CBG:  Lab 05/01/12 0822 05/01/12 0445 05/01/12 0019 04/30/12 1939 04/30/12 1722  GLUCAP 151* 114* 155* 98 145*    Recent Results (from the past 240 hour(s))  CULTURE, BLOOD (ROUTINE X 2)     Status: Normal   Collection Time   04/24/12 11:35 PM      Component Value Range Status Comment   Specimen Description BLOOD LEFT ARM   Final    Special Requests BOTTLES DRAWN AEROBIC ONLY 5CC    Final    Culture  Setup Time 04/25/2012 11:02   Final    Culture NO GROWTH 5 DAYS   Final    Report Status 05/01/2012 FINAL   Final   CULTURE, BLOOD (ROUTINE X 2)     Status: Normal   Collection Time   04/24/12 11:40 PM      Component Value Range Status Comment   Specimen Description BLOOD LEFT HAND   Final    Special Requests BOTTLES DRAWN AEROBIC ONLY 5CC   Final    Culture  Setup Time 04/25/2012 11:02   Final    Culture NO GROWTH 5 DAYS   Final    Report Status 05/01/2012 FINAL   Final   CULTURE, BLOOD (ROUTINE X 2)     Status: Normal (Preliminary result)   Collection Time   04/28/12  3:38 PM      Component Value Range Status Comment   Specimen Description BLOOD RIGHT HAND   Final    Special Requests BOTTLES DRAWN AEROBIC ONLY 5CC   Final    Culture  Setup Time 04/28/2012 22:13   Final    Culture     Final    Value:        BLOOD CULTURE RECEIVED NO GROWTH TO DATE CULTURE WILL BE HELD FOR 5 DAYS BEFORE ISSUING A FINAL NEGATIVE REPORT   Report Status PENDING   Incomplete   CULTURE, BLOOD (ROUTINE X 2)     Status: Normal (Preliminary result)   Collection Time   04/28/12  3:48 PM      Component Value Range Status Comment   Specimen Description BLOOD RIGHT ARM   Final    Special Requests BOTTLES DRAWN AEROBIC ONLY 5CC   Final    Culture  Setup Time 04/28/2012 22:12   Final    Culture     Final    Value:        BLOOD CULTURE RECEIVED NO GROWTH TO DATE CULTURE WILL BE HELD FOR 5 DAYS BEFORE ISSUING A FINAL NEGATIVE REPORT   Report Status PENDING   Incomplete   ANAEROBIC CULTURE     Status: Normal (Preliminary result)   Collection Time   04/30/12  5:15 PM      Component Value Range Status Comment   Specimen Description ABSCESS ABDOMEN   Final    Special Requests 4 SYRINGES @ 60CC   Final    Gram Stain     Final    Value: ABUNDANT WBC PRESENT, PREDOMINANTLY PMN     NO SQUAMOUS EPITHELIAL CELLS SEEN     ABUNDANT GRAM POSITIVE COCCI IN PAIRS     IN CLUSTERS   Culture     Final    Value:  NO ANAEROBES ISOLATED; CULTURE IN PROGRESS FOR 5 DAYS   Report Status PENDING   Incomplete  FUNGUS CULTURE W SMEAR     Status: Normal (Preliminary result)   Collection Time   04/30/12  5:15 PM      Component Value Range Status Comment   Specimen Description ABSCESS ABDOMEN   Final    Special Requests 4 SYRINGES @ 60CC   Final    Fungal Smear NO YEAST OR FUNGAL ELEMENTS SEEN   Final    Culture CULTURE IN PROGRESS FOR FOUR WEEKS   Final    Report Status PENDING   Incomplete   CULTURE, ROUTINE-ABSCESS     Status: Normal (Preliminary result)   Collection Time   04/30/12  5:15 PM      Component Value Range Status Comment   Specimen Description ABSCESS ABDOMEN   Final    Special Requests 4 SYRINGES @ 60CC   Final    Gram Stain     Final    Value: ABUNDANT WBC PRESENT, PREDOMINANTLY PMN     NO SQUAMOUS EPITHELIAL CELLS SEEN     ABUNDANT GRAM POSITIVE COCCI IN PAIRS     IN CLUSTERS   Culture NO GROWTH   Final    Report Status PENDING   Incomplete     Scheduled Meds: reviewed Continuous Infusions: reviewed _________________________________________________________________  05/01/2012, 3:46 PM  LOS: 29 days    Lonia Blood, MD [facility] Office  [phone] Pager [phone]  On-Call/Text Page:      [email or web address]      password [facility]

## 2012-05-01 NOTE — Progress Notes (Addendum)
PARENTERAL NUTRITION CONSULT NOTE - FOLLOW UP  Pharmacy Consult for TPN Indication: SBO, s/p LOA/SBR/anastamosis , s/p LOA/SBR/re-anastamosis with patch repair for leak  Allergies  Allergen Reactions  . Allopurinol     REACTION: decreased platelets  . Aspirin     REACTION: unspecified    Patient Measurements: Height: 5\' 11"  (180.3 cm) Weight: 154 lb 5.2 oz (70 kg) IBW/kg (Calculated) : 75.3   Vital Signs: Temp: 97.9 F (36.6 C) (08/10 0446) Temp src: Oral (08/10 0446) BP: 134/60 mmHg (08/10 0800) Pulse Rate: 68  (08/10 0800) Intake/Output from previous day: 08/09 0701 - 08/10 0700 In: 2370 [P.O.:240; I.V.:60; IV Piggyback:150; TPN:1920] Out: 2940 [Emesis/NG output:550; Drains:240] Intake/Output from this shift: Total I/O In: 80 [TPN:80] Out: -   Labs:  Basename 04/30/12 1354 04/30/12 1352 04/30/12 0500 04/29/12 2051  WBC -- 21.5* 22.4* 20.6*  HGB -- 10.5* 8.3* 7.4*  HCT -- 30.4* 24.2* 22.3*  PLT -- 151 163 133*  APTT 37 -- -- --  INR 1.15 -- -- --     Basename 05/01/12 0425 04/30/12 0724 04/30/12 0500 04/29/12 0710  NA 129* 132* 129* --  K 3.6 3.7 3.8 --  CL 94* 97 94* --  CO2 22 24 23  --  GLUCOSE 107* 150* 180* --  BUN 54* 35* 49* --  CREATININE 3.94* 2.79* 3.64* --  LABCREA -- -- -- --  CREAT24HRUR -- -- -- --  CALCIUM 9.3 8.9 8.9 --  MG -- -- -- 1.6  PHOS -- 2.7 3.3 2.2*  PROT -- -- -- 4.8*  ALBUMIN -- 1.6* -- 1.4*  AST -- -- -- 24  ALT -- -- -- 14  ALKPHOS -- -- -- 70  BILITOT -- -- -- 1.2  BILIDIR -- -- -- --  IBILI -- -- -- --  PREALBUMIN -- -- -- --  TRIG -- -- -- --  CHOLHDL -- -- -- --  CHOL -- -- -- --   Estimated Creatinine Clearance: 21.7 ml/min (by C-G formula based on Cr of 3.94).    Basename 05/01/12 0822 05/01/12 0445 05/01/12 0019  GLUCAP 151* 114* 155*    Insulin Requirements in the past 24 hours:  6 units Novolog. Currently on Moderate SSI q4h with 10 units insulin in TNA  Current Nutrition:  NPO, Clinimix 5/15 at 70  ml/hr with lipids at 10 ml/hr MWF only  Admit:  Admitted 7/13 with abd pain, weight loss, SBO. Presently in the OR for repeat thrombectomy of L thigh AV Graft. (s/p thrombectomy 7/25).  GI-  SBO, back to OR 8/1 for anastomotic leak- now s/p patch repair. S/p perc drains for intra-abdominal abscess.  Has been essentially NPO since admit, therefore at significant risk for re-feeding, but has been tolerating TPN thus far. NGT output 550. Endo: Moderate SSI q4, on Solu-Cortef-back to bid dosing. CBG well controlled with insulin in TNA.  Goal <180 Lytes:  Na low, likely d/t overload and Clinimix without supplemented lytes.  Corrected Ca =11.2,slightly high despite no lytes in TNA. Renal:  ESRD w/HD typically T/TH/Sat - tolerating HD this am - Ferrlecit, Aranesp-Sat Pulm: extubated; now on RA Cards: hypertensive. Pos trop, on prn anti-HTNs; hgb 8.3 this am with prn transfusions Hepatobil: LFTs, trig and chol WNL.  Prealbumin decreased - will follow. Neuro:  Morphine prn for severe pain ID: Afeb, WBC elevated. Lower abdominal peritonitis- on Mycamine and renally adjusted Zosyn Best Practices:  Mouthcare, PPI-IV, scd's ordered, pt refusing  Nutritional Goals:  1700-1950 kCal, 75-85 grams  of protein per day  Plan:  - Continue Clinimix 5/15 to 70 ml/hr, which meets protein goal but is just below kcal. - Provide MVI and lipids (@10cc /hr) on MWF only d/t national backorder. - Continue 10 units insulin in TNA - F/u am BMP  Nyleah Mcginnis L. Illene Bolus, PharmD, BCPS Clinical Pharmacist Pager: [phone] Pharmacy: [phone] 05/01/2012 8:43 AM

## 2012-05-01 NOTE — Procedures (Signed)
Patient was seen on dialysis and the procedure was supervised.  BFR 300  Via AVG BP is  134/60.   Patient appears to be tolerating treatment well  [name] A 05/01/2012

## 2012-05-02 LAB — BASIC METABOLIC PANEL
Calcium: 9.3 mg/dL (ref 8.4–10.5)
Creatinine, Ser: 3.61 mg/dL — ABNORMAL HIGH (ref 0.50–1.35)
GFR calc Af Amer: 21 mL/min — ABNORMAL LOW (ref >90)
GFR calc non Af Amer: 18 mL/min — ABNORMAL LOW (ref >90)
Sodium: 132 mEq/L — ABNORMAL LOW (ref 135–145)

## 2012-05-02 LAB — HEPATITIS PANEL, ACUTE
HCV Ab: REACTIVE — AB
Hep A IgM: NEGATIVE
Hepatitis B Surface Ag: NEGATIVE

## 2012-05-02 LAB — GLUCOSE, CAPILLARY
Glucose-Capillary: 159 mg/dL — ABNORMAL HIGH (ref 70–99)
Glucose-Capillary: 179 mg/dL — ABNORMAL HIGH (ref 70–99)

## 2012-05-02 MED ORDER — INSULIN REGULAR HUMAN 100 UNIT/ML IJ SOLN
INTRAVENOUS | Status: AC
  Administered 2012-05-02: 17:00:00 via INTRAVENOUS
  Filled 2012-05-02: qty 2000

## 2012-05-02 MED ORDER — HEPARIN SODIUM (PORCINE) 1000 UNIT/ML DIALYSIS: 20.0000 [IU]/kg | INTRAMUSCULAR | Status: DC | PRN

## 2012-05-02 NOTE — Progress Notes (Signed)
PARENTERAL NUTRITION CONSULT NOTE - FOLLOW UP  Pharmacy Consult for TPN Indication: SBO, s/p LOA/SBR/anastamosis , s/p LOA/SBR/re-anastamosis with patch repair for leak  Allergies  Allergen Reactions  . Allopurinol     REACTION: decreased platelets  . Aspirin     REACTION: unspecified    Patient Measurements: Height: 5\' 11"  (180.3 cm) Weight: 139 lb 12.4 oz (63.4 kg) IBW/kg (Calculated) : 75.3   Vital Signs: Temp: 97.9 F (36.6 C) (08/11 0800) Temp src: Oral (08/11 0800) BP: 147/69 mmHg (08/11 0400) Pulse Rate: 77  (08/11 0500) Intake/Output from previous day: 08/10 0701 - 08/11 0700 In: 1590 [IV Piggyback:260; TPN:1310] Out: 2577 [Emesis/NG output:300; Drains:10] Intake/Output from this shift: Total I/O In: 70 [TPN:70] Out: -   Labs:  Basename 05/01/12 1002 04/30/12 1354 04/30/12 1352 04/30/12 0500  WBC 19.2* -- 21.5* 22.4*  HGB 10.1* -- 10.5* 8.3*  HCT 28.8* -- 30.4* 24.2*  PLT 180 -- 151 163  APTT -- 37 -- --  INR -- 1.15 -- --     Basename 05/02/12 0452 05/01/12 1002 05/01/12 0425 04/30/12 0724 04/30/12 0500  NA 132* 129* 129* -- --  K 3.5 3.6 3.6 -- --  CL 96 93* 94* -- --  CO2 23 21 22  -- --  GLUCOSE 154* 120* 107* -- --  BUN 45* 60* 54* -- --  CREATININE 3.61* 4.31* 3.94* -- --  LABCREA -- -- -- -- --  CREAT24HRUR -- -- -- -- --  CALCIUM 9.3 9.4 9.3 -- --  MG -- -- -- -- --  PHOS -- 2.7 -- 2.7 3.3  PROT -- -- -- -- --  ALBUMIN -- 1.5* -- 1.6* --  AST -- -- -- -- --  ALT -- -- -- -- --  ALKPHOS -- -- -- -- --  BILITOT -- -- -- -- --  BILIDIR -- -- -- -- --  IBILI -- -- -- -- --  PREALBUMIN -- -- -- -- --  TRIG -- -- -- -- --  CHOLHDL -- -- -- -- --  CHOL -- -- -- -- --   Estimated Creatinine Clearance: 21.5 ml/min (by C-G formula based on Cr of 3.61).    Basename 05/02/12 0832 05/02/12 0054 05/01/12 2031  GLUCAP 110* 176* 159*    Insulin Requirements in the past 24 hours:  9 units Novolog. Currently on Moderate SSI q4h with 10 units  insulin in TNA  Current Nutrition:  NPO, Clinimix 5/15 at 70 ml/hr with lipids at 10 ml/hr MWF only  Admit:  Admitted 7/13 with abd pain, weight loss, SBO. Presently in the OR for repeat thrombectomy of L thigh AV Graft. (s/p thrombectomy 7/25).  GI-  SBO, back to OR 8/1 for anastomotic leak- now s/p patch repair. S/p perc drains for intra-abdominal abscess.  Has been essentially NPO since admit, therefore at significant risk for re-feeding, but has been tolerating TPN thus far. NGT output slightly decrease to 300/24h yesterday. Endo: Moderate SSI q4, on Solu-Cortef-back to bid dosing. CBG well controlled with insulin in TNA.  Goal <180 Lytes:  Na low, likely d/t overload and Clinimix without supplemented lytes.  Corrected Ca =11.1,slightly high despite no lytes in TNA. Renal:  ESRD w/HD typically T/TH/Sat - was receiving daily HD, s/p HD Sat, orders for HD Monday- Ferrlecit, Aranesp-Sat Pulm: extubated; now on RA Cards: hypertensive. Pos trop, on prn anti-HTNs; hgb 8.3 this am with prn transfusions Hepatobil: LFTs, trig and chol WNL.  Prealbumin decreased - will follow. Neuro:  Morphine  prn for severe pain ID: Afeb, WBC elevated. Lower abdominal peritonitis- on Mycamine and renally adjusted Zosyn Best Practices:  Mouthcare, PPI-IV, scd's ordered, pt refusing  Nutritional Goals:  1700-1950 kCal, 75-85 grams of protein per day  Plan:  - Continue Clinimix 5/15 to 70 ml/hr, which meets protein goal but is just below kcal. - Provide available trace elements, MVI and lipids @10cc /hr on MWF only d/t national backorder. - Continue 10 units insulin in TNA - F/u am TNA labs  Ardath Lepak L. Illene Bolus, PharmD, BCPS Clinical Pharmacist Pager: [phone] Pharmacy: [phone] 05/02/2012 8:42 AM

## 2012-05-02 NOTE — Progress Notes (Signed)
.  NGT output seems to be trending down [facility] Progress Note  Subjective:  Patient awake and alert this AM, no new complaints.  Again tolerated HD well yesterday, sodium is up to 132.  NGT output seems to be trending down.  More hemodynamically stable  Objective Filed Vitals:   05/02/12 0000 05/02/12 0200 05/02/12 0400 05/02/12 0500  BP: 164/78 149/72 147/69   Pulse: 82 74 75 77  Temp: 98.3 F (36.8 C)  97.9 F (36.6 C)   TempSrc: Oral  Oral   Resp: 15 13 14 13   Height:      Weight:      SpO2: 99% 100% 100% 100%   Physical Exam General: seems more comfortable Heart: RRR Lungs: mostly clear Abdomen: distended, slightly painful.  I can appreciate bowel sounds Extremities: no peripheral edema Dialysis Access: left thigh AVGG patent  Dialysis Orders: Center: [facility] on TTS.  EDW 52.5 kg HD Bath 2K/2Ca Time 3.5 hrs Heparin 5000 U. Access AVG @ left thigh BFR 400 DFR 800 Zemplar 0 mcg IV/HD Epogen 0 Units IV/HD Venofer 50/week; last iPTH 314, but product high and no zemplar   Assessment/Plan:  1. SBO, s/p exp lap/SB resection 7/24 Dr. Luisa Hart, c/w dehiscence with peritonitis, back to OR 8/1 for further SB resection and reanastomosis. Now s/p perc drainage of intraabd abcesses on 8/8. On IV zosyn and mycofungin.   Still with ileus/NGT per surgery. Also needing hydrotherapy to open wound. NGT drainage maybe slowing ? 2. ESRD - HD normally  TTS at [facility]. temp R groin cath still in place, they are using for TPN , thigh graft s/p declot is functioning ok.  HD daily for Mora while to keep up with volume.  4 K bath. Plan for HD tomorrow 3.  Hypertension/volume - was on no BP meds at home. BP has been variable here.  I stopped clonidine and hemodynamics seem improved.  Keep metoprolol given sinus tach.   Steroids being weaned. Daily HD should keep in check.  Is hyponatremic due to volume overload, HD will help.   4. Anemia of CKD - Getting max Aranesp.  < 10; on full course of IV Fe;  ferritin 552. PRN transfusions, received 2 units over Thursday and Friday. Hgb now 10 ? 5. Metabolic bone disease - No binders due to low phos being repleted.  Phos now within normal limits 6. Protein calorie malnutrition- Pre-albumin very low; on TNA 70cc/hr (1680 = any other fluids) today. Getting lipids MWF due to shortage; getting phos and K repletion today 7. S/p renal transplant - [facility]87, started HD in 04/2003, remains on Prednisone.       9. Hepatitis C/thrombocytopenia - stable  Maybe some marginal improvement    [name]  05/02/2012      Additional Objective Labs: Basic Metabolic Panel:  Lab 05/02/12 4098 05/01/12 1002 05/01/12 0425 04/30/12 0724 04/30/12 0500  NA 132* 129* 129* -- --  K 3.5 3.6 3.6 -- --  CL 96 93* 94* -- --  CO2 23 21 22  -- --  GLUCOSE 154* 120* 107* -- --  BUN 45* 60* 54* -- --  CREATININE 3.61* 4.31* 3.94* -- --  CALCIUM 9.3 9.4 9.3 -- --  ALB -- -- -- -- --  PHOS -- 2.7 -- 2.7 3.3   Liver Function Tests:  Lab 05/01/12 1002 04/30/12 0724 04/29/12 0710 04/26/12 0711  AST -- -- 24 16  ALT -- -- 14 10  ALKPHOS -- --  70 60  BILITOT -- -- 1.2 1.4*  PROT -- -- 4.8* 5.7*  ALBUMIN 1.5* 1.6* 1.4* --   CBC:  Lab 05/01/12 1002 04/30/12 1352 04/30/12 0500 04/29/12 2051 04/29/12 0710 04/26/12 0711  WBC 19.2* 21.5* 22.4* -- -- --  NEUTROABS -- -- 19.1* -- -- 12.3*  HGB 10.1* 10.5* 8.3* -- -- --  HCT 28.8* 30.4* 24.2* -- -- --  MCV 84.0 83.3 81.8 83.8 81.3 --  PLT 180 151 163 -- -- --  Blood Culture    Component Value Date/Time   SDES ABSCESS ABDOMEN 04/30/2012 1715   SDES ABSCESS ABDOMEN 04/30/2012 1715   SDES ABSCESS ABDOMEN 04/30/2012 1715   SPECREQUEST 4 SYRINGES @ 60CC 04/30/2012 1715   SPECREQUEST 4 SYRINGES @ 60CC 04/30/2012 1715   SPECREQUEST 4 SYRINGES @ 60CC 04/30/2012 1715   CULT NO ANAEROBES ISOLATED; CULTURE IN PROGRESS FOR 5 DAYS 04/30/2012 1715   CULT CULTURE IN PROGRESS FOR FOUR WEEKS 04/30/2012 1715   CULT NO GROWTH 1  DAY 04/30/2012 1715   REPTSTATUS PENDING 04/30/2012 1715   REPTSTATUS PENDING 04/30/2012 1715   REPTSTATUS PENDING 04/30/2012 1715  Cardiac Enzymes:  Lab 04/28/12 1100 04/28/12 0236 04/27/12 2130 04/27/12 1300 04/27/12 0525  CKTOTAL 46 32 22 24 23   CKMB 4.1* 4.8* 5.4* 7.1* 4.7*  CKMBINDEX -- -- -- -- --  TROPONINI 1.91* 2.14* 1.49* 2.32* 1.27*  CBG:  Lab 05/02/12 0054 05/01/12 2031 05/01/12 1553 05/01/12 0822 05/01/12 0445  GLUCAP 176* 159* 169* 151* 114*  Medications:    . fat emulsion 240 mL (04/30/12 1726)  . TPN (CLINIMIX) +/- additives 70 mL/hr at 04/30/12 1727  . TPN (CLINIMIX) +/- additives 70 mL/hr at 05/01/12 1751      . antiseptic oral rinse  15 mL Mouth Rinse q12n4p  . chlorhexidine  15 mL Mouth Rinse BID  . darbepoetin (ARANESP) injection - DIALYSIS  200 mcg Intravenous Q Sat-HD  . fentaNYL  25 mcg Transdermal Q72H  . ferric gluconate (FERRLECIT/NULECIT) IV  125 mg Intravenous Q T,Th,Sa-HD  . hydrocortisone sod succinate (SOLU-CORTEF) injection  25 mg Intravenous Q12H  . insulin aspart  0-15 Units Subcutaneous Q4H  . lip balm   Topical BID  . metoprolol  5 mg Intravenous Q6H  . micafungin (MYCAMINE) IV  100 mg Intravenous Daily  . pantoprazole (PROTONIX) IV  40 mg Intravenous Q24H  . piperacillin-tazobactam (ZOSYN)  IV  2.25 g Intravenous Q8H  . sodium chloride  10-40 mL Intracatheter Q12H  . sodium phosphate  1 enema Rectal BID  . DISCONTD: antiseptic oral rinse  15 mL Mouth Rinse q12n4p  . DISCONTD: chlorhexidine  15 mL Mouth Rinse BID

## 2012-05-02 NOTE — Progress Notes (Signed)
[facility] PROGRESS NOTE  [name] WUJ:[id number] DOB: 1961/03/05 DOA: 04/02/2012 PCP: Trevor Iha, MD  Brief narrative: 51 y/o male with ESRD and Prostatectomy in 2012. One day prior to admission he started having abdominal pain which progressively got worse - mainly periumbilical - nothing made it better or worse - had no radiation.  He started vomiting about 3 or 4 times and decided to come to the emergency room.  In the emergency room a CT scan of the abdomen and pelvis was done that showed small bowel obstruction.On 7/24 he underwent lysis of adhesions and partial small bowel resection (iliectomy and partial jejunectomy).Unfortunately on 7//25 he developed a clot in his L femoral AV graft requiring thrombectomy. Small bowel obstruction/ileus symptoms persisted and he subsequently went back to OR on 8/2 for what was found to be a 3cm anastomotic leak with peritonitis. Because of anticipated hemodynamic instability in a malnourished immunocompromised renal patient he was sent to the step down unit for further evaluation and monitoring.  Assessment/Plan:  SBO (small bowel obstruction) s/p lysis of adhesions and partial small bowel resection 7/24 *Subsequent anastomosis dehiscence / SBO s/p lysis of adhesions, small bowel resection, omental patch of anastomosis 8/2  *CT abdomen and pelvis 04/29/2012 demonstrated several fluid collections consistent with intra-abdominal abscess with the largest measuring 16.5 x 13.7 x 6.4 cm.  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 *An attempt was made on 04/21/2012 to place a central line in interventional radiology under fluoroscopy but unfortunately central venous access could not be placed due to upper extremity and internal jugular vein access being chronically occluded bilaterally therefore rationale for why we unfortunately have to continue to use this patient's right femoral Vas-Cath port for parenteral nutrition  Chronic nonocclusive left lower extremity DVT Diagnosed via Doppler 04/30/2012  Ileus following gastrointestinal surgery *Cont TNA via right femoral Vas-Cath port per surgery *As above - anticipated protracted postoperative course  Constipation with fecal impaction *Surgeon attempted to disimpact 04/29/2012 but unfortunately did not tolerate enemas  Atypical chest pain/abnormal cardiac enzyme *Had episode of severe chest pain during this hospitalization. CT of the chest revealed no evidence of pulmonary embolism or aortic dissection *Suspect mildly elevated cardiac isoenzymes related to demand ischemia versus recent stressors of recurrent surgical procedures *Multiple EKGs have been performed since admission including during episode of severe pain on Saturday evening and showed no definitive ischemic changes  CKD (chronic kidney disease) stage V requiring chronic dialysis *Dialysis per nephrology  Hypokalemia/hypophosphatemia *Management per nephrology service/TNA  Thrombocytopenia *Recurrent this admission x 2 episodes  Normocytic Anemia  *Due to acute blood loss and due to chronic disease *Receive packed red blood cells on 04/25/2012 *Cont aranesp and ferric gluconate  HTN (hypertension) Continue to follow without change today  Chronic use of steroids On very slow steroid taper  Malnutrition, calorie *Patient cachectic at presentation  *Now with acute malnutrition related  to postoperative ileus and surgical complications *Parenteral nutrition managed by surgery and pharmacy  Leg dialysis graft occlusion *s/p thrombectomy 7/25 - unfortunately re clotted-has undergone an additional thrombectomy procedure with revision of  arterial and graft on 04/27/2012 *Currently being dialyzed via groin HD catheter- VVS following  DVT prophylaxis: SCDs Code Status: full  Disposition Plan: Remain in step down  Consultants: General Surgery [facility] Infectious disease  Events since admission: 7/13 CT Abdomen with SBO  7/24 Ex-lap, lysis of adhesions, partial small bowel resection x2  7/25 Thrombectomy L femoral AV graft  7/26 Extubated, CVL is out  7/27 Transferred to telemetry under [facility]  8/2 Acute abdomen, dehiscence of anastomosis, SBO, back from OR intubated in ICU on PCCM service. Found to have moderate peritonitis infraumbilically 8/2 Extubated  Antibiotics: Zosyn 8/1 >>> Micafungin 8/3 >> Invanz 7/24- periop Fluconazole- 8/2- one dose Vancomycin 8/8 >>> 05/01/2012  HPI/Subjective: Patient is in good spirits today.  His pain is reasonably well controlled.  He has no new complaints.  Objective: Filed Vitals:   05/02/12 0400 05/02/12 0500 05/02/12 0800 05/02/12 1158  BP: 147/69   157/70  Pulse: 75 77    Temp: 97.9 F (36.6 C)  97.9 F (36.6 C) 98.6 F (37 C)  TempSrc: Oral  Oral Oral  Resp: 14 13  12   Height:      Weight:      SpO2: 100% 100%  100%    Intake/Output Summary (Last 24 hours) at 05/02/12 1512 Last data filed at 05/02/12 1425  Gross per 24 hour  Intake   1830 ml  Output    310 ml  Net   1520 ml    Exam:   General:  Alert male, cachectic-appearing, in no acute distress  Cardiovascular: Sinus rhythm, S1-S2, peripheral pulses palpable, no peripheral edema  Respiratory: Clear to auscultation throughout without wheeze  Abdomen: Midline dressing in place- bs negative, non-distended but is tender around the  wound  Musculoskeletal: No significant lower extremity edema today  Neurological: Patient is alert and oriented  Data Reviewed: Basic Metabolic Panel:  Lab 05/02/12 1610 05/01/12 1002 05/01/12 0425 04/30/12 0724 04/30/12 0500 04/29/12 0710 04/28/12 0715 04/26/12 0711  NA 132* 129* 129* 132* 129* -- -- --  K 3.5 3.6 3.6 3.7 3.8 -- -- --  CL 96 93* 94* 97 94* -- -- --  CO2 23 21 22 24 23  -- -- --  GLUCOSE 154* 120* 107* 150* 180* -- -- --  BUN 45* 60* 54* 35* 49* -- -- --  CREATININE 3.61* 4.31* 3.94* 2.79* 3.64* -- -- --  CALCIUM 9.3 9.4 9.3 8.9 8.9 -- -- --  MG -- -- -- -- -- 1.6 -- 1.9  PHOS -- 2.7 -- 2.7 3.3 2.2* 1.0* --   Liver Function Tests:  Lab 05/01/12 1002 04/30/12 0724 04/29/12 0710 04/28/12 0715 04/26/12 0711  AST -- -- 24 -- 16  ALT -- -- 14 -- 10  ALKPHOS -- -- 70 -- 60  BILITOT -- -- 1.2 -- 1.4*  PROT -- -- 4.8* -- 5.7*  ALBUMIN 1.5* 1.6* 1.4* 1.5* 1.7*   CBC:  Lab 05/01/12 1002 04/30/12 1352 04/30/12 0500 04/29/12 2051 04/29/12 0710 04/26/12 0711  WBC 19.2* 21.5* 22.4* 20.6* 23.6* --  NEUTROABS -- -- 19.1* -- -- 12.3*  HGB 10.1* 10.5* 8.3* 7.4* 7.4* --  HCT 28.8* 30.4* 24.2* 22.3* 21.8* --  MCV 84.0 83.3 81.8 83.8 81.3 --  PLT 180 151 163 133* 139* --   Cardiac Enzymes:  Lab 04/28/12 1100 04/28/12 0236 04/27/12 2130 04/27/12 1300 04/27/12 0525  CKTOTAL 46 32 22 24 23   CKMB 4.1* 4.8* 5.4* 7.1* 4.7*  CKMBINDEX -- -- -- -- --  TROPONINI 1.91* 2.14* 1.49* 2.32* 1.27*   CBG:  Lab 05/02/12 1144 05/02/12 0832 05/02/12 0500 05/02/12 0054 05/01/12 2031  GLUCAP 160* 110* 153* 176* 159*    Recent Results (from the past 240 hour(s))  CULTURE, BLOOD (ROUTINE X 2)     Status: Normal   Collection Time   04/24/12 11:35 PM      Component Value Range Status Comment   Specimen Description BLOOD LEFT ARM   Final    Special Requests BOTTLES DRAWN AEROBIC ONLY 5CC   Final    Culture  Setup Time 04/25/2012 11:02   Final    Culture NO GROWTH 5 DAYS   Final     Report Status 05/01/2012 FINAL   Final   CULTURE, BLOOD (ROUTINE X 2)     Status: Normal   Collection Time   04/24/12 11:40 PM      Component Value Range Status Comment   Specimen Description BLOOD LEFT HAND   Final    Special Requests BOTTLES DRAWN AEROBIC ONLY 5CC   Final    Culture  Setup Time 04/25/2012 11:02   Final    Culture NO GROWTH 5 DAYS   Final    Report Status 05/01/2012 FINAL   Final   CULTURE, BLOOD (ROUTINE X 2)     Status: Normal (Preliminary result)   Collection Time   04/28/12  3:38 PM      Component Value Range Status Comment   Specimen Description BLOOD RIGHT HAND   Final    Special Requests BOTTLES DRAWN AEROBIC ONLY 5CC   Final    Culture  Setup Time 04/28/2012 22:13   Final    Culture     Final    Value:        BLOOD CULTURE RECEIVED NO GROWTH TO DATE CULTURE WILL BE HELD FOR 5 DAYS BEFORE ISSUING A FINAL NEGATIVE REPORT   Report Status PENDING   Incomplete   CULTURE, BLOOD (ROUTINE X 2)     Status: Normal (Preliminary result)   Collection Time   04/28/12  3:48 PM      Component Value Range Status Comment   Specimen Description BLOOD RIGHT ARM   Final    Special Requests BOTTLES DRAWN AEROBIC ONLY 5CC   Final    Culture  Setup Time 04/28/2012 22:12   Final    Culture     Final    Value:        BLOOD CULTURE RECEIVED NO GROWTH TO DATE CULTURE WILL BE HELD FOR 5 DAYS BEFORE ISSUING A FINAL NEGATIVE REPORT   Report Status PENDING   Incomplete   ANAEROBIC CULTURE     Status: Normal (Preliminary result)   Collection Time   04/30/12  5:15 PM      Component Value Range Status Comment   Specimen Description ABSCESS ABDOMEN   Final    Special Requests 4 SYRINGES @ 60CC   Final    Gram Stain     Final    Value: ABUNDANT WBC PRESENT, PREDOMINANTLY PMN     NO SQUAMOUS EPITHELIAL CELLS SEEN     ABUNDANT GRAM POSITIVE COCCI IN PAIRS     IN CLUSTERS   Culture     Final    Value: NO ANAEROBES ISOLATED; CULTURE IN PROGRESS FOR 5 DAYS   Report Status PENDING   Incomplete    FUNGUS CULTURE W SMEAR     Status: Normal (Preliminary result)   Collection Time   04/30/12  5:15  PM      Component Value Range Status Comment   Specimen Description ABSCESS ABDOMEN   Final    Special Requests 4 SYRINGES @ 60CC   Final    Fungal Smear NO YEAST OR FUNGAL ELEMENTS SEEN   Final    Culture CULTURE IN PROGRESS FOR FOUR WEEKS   Final    Report Status PENDING   Incomplete   CULTURE, ROUTINE-ABSCESS     Status: Normal (Preliminary result)   Collection Time   04/30/12  5:15 PM      Component Value Range Status Comment   Specimen Description ABSCESS ABDOMEN   Final    Special Requests 4 SYRINGES @ 60CC   Final    Gram Stain     Final    Value: ABUNDANT WBC PRESENT, PREDOMINANTLY PMN     NO SQUAMOUS EPITHELIAL CELLS SEEN     ABUNDANT GRAM POSITIVE COCCI IN PAIRS     IN CLUSTERS   Culture NO GROWTH 1 DAY   Final    Report Status PENDING   Incomplete     Scheduled Meds: reviewed Continuous Infusions: reviewed _________________________________________________________________  05/02/2012, 3:12 PM  LOS: 30 days    Lonia Blood, MD [facility] Office  [phone] Pager [phone]  On-Call/Text Page:      [email or web address]      password [email or web address]

## 2012-05-02 NOTE — Progress Notes (Signed)
5 Days Post-Op  Subjective: Resting comfortably.  Minimal drain output.  Still quite tired.  Objective: Vital signs in last 24 hours: Temp:  [97.7 F (36.5 C)-98.6 F (37 C)] 97.9 F (36.6 C) (08/11 0800) Pulse Rate:  [74-121] 77  (08/11 0500) Resp:  [13-18] 13  (08/11 0500) BP: (138-164)/(59-82) 147/69 mmHg (08/11 0400) SpO2:  [98 %-100 %] 100 % (08/11 0500) Weight:  [139 lb 12.4 oz (63.4 kg)] 139 lb 12.4 oz (63.4 kg) (08/10 1320) Last BM Date: 04/30/12  Intake/Output from previous day: 08/10 0701 - 08/11 0700 In: 1590 [IV Piggyback:260; TPN:1310] Out: 2577 [Emesis/NG output:300; Drains:10] Intake/Output this shift: Total I/O In: 70 [TPN:70] Out: -   Wound clean, minimal drainage  Lab Results:   [facility] 05/01/12 1002 04/30/12 1352  WBC 19.2* 21.5*  HGB 10.1* 10.5*  HCT 28.8* 30.4*  PLT 180 151   BMET  Basename 05/02/12 0452 05/01/12 1002  NA 132* 129*  K 3.5 3.6  CL 96 93*  CO2 23 21  GLUCOSE 154* 120*  BUN 45* 60*  CREATININE 3.61* 4.31*  CALCIUM 9.3 9.4   PT/INR  Basename 04/30/12 1354  LABPROT 14.9  INR 1.15   ABG No results found for this basename: PHART:2,PCO2:2,PO2:2,HCO3:2 in the last 72 hours  Studies/Results: Ct Guided Abscess Drain  04/30/2012  *RADIOLOGY REPORT*  Indication: Post surgery for small bowel obstruction, now with anastomotic leak and large intra-abdominal abscess.  CT GUIDED RIGHT LOWER QUADRANT ABDOMINAL DRAINAGE CATHETER PLACEMENT  Comparison: CT abdomen pelvis - 04/29/2012  Medications: Fentanyl 75 mcg IV; Versed 1.5 mg IV  Total Moderate Sedation time: 20 minutes  Contrast: None  Complications: None immediate  Technique / Findings:  Informed written consent was obtained from the patient after a discussion of the risks, benefits and alternatives to treatment. The patient was placed supine on the CT gantry and a pre procedural CT was performed re-demonstrating the known abscess/fluid collection within the right lower abdominal  quadrant.  The procedure was planned.   A timeout was performed prior to the initiation of the procedure.  The skin overlying the right lower abdomen was prepped and draped in the usual sterile fashion.   The overlying soft tissues were anesthetized with 1% lidocaine with epinephrine.  An 18 gauge trocar needle was advanced in to the abscess/fluid collection and a short Amplatz super stiff wire was coiled within the abscess/fluid collection.   Appropriate positioning was confirmed with a limited CT scan.  The tract was serially dilated allowing placement of a 10 [place] all-purpose drainage catheter.  Appropriate positioning was confirmed with a limited postprocedural CT scan.  200 ml of bloody, slightly purulent fluid was aspirated.  The tube was connected to a drainage bag and sutured in place.  A dressing was placed.  The patient tolerated the procedure well without immediate post procedural complication.  Impression:  Successful CT guided placement of a 10 [place] all purpose drain catheter into the right lower abdomen with aspiration of 200 mL of bloody, slightly purulent fluid.  Samples were sent to the laboratory as requested by the ordering clinical team.  Original Report Authenticated By: Waynard Reeds, M.D.    Anti-infectives: Anti-infectives     Start     Dose/Rate Route Frequency Ordered Stop   04/30/12 1400   vancomycin (VANCOCIN) 750 mg in sodium chloride 0.9 % 150 mL IVPB  Status:  Discontinued        750 mg 150 mL/hr over 60 Minutes Intravenous  Once 04/30/12 1212 04/30/12 1507   04/29/12 2330   vancomycin (VANCOCIN) 1,500 mg in sodium chloride 0.9 % 500 mL IVPB        1,500 mg 250 mL/hr over 120 Minutes Intravenous  Once 04/29/12 2259 04/30/12 0246   04/27/12 0000   ceFAZolin (ANCEF) IVPB 1 g/50 mL premix  Status:  Discontinued     Comments: Send with pt to OR      1 g 100 mL/hr over 30 Minutes Intravenous On call 04/26/12 0927 04/26/12 0928   04/24/12 1000   micafungin (MYCAMINE)  100 mg in sodium chloride 0.9 % 100 mL IVPB        100 mg 100 mL/hr over 1 Hours Intravenous Daily 04/24/12 0750     04/23/12 1600   fluconazole (DIFLUCAN) IVPB 400 mg        400 mg 200 mL/hr over 60 Minutes Intravenous  Once 04/23/12 1428 04/23/12 1800   04/22/12 2200   piperacillin-tazobactam (ZOSYN) IVPB 3.375 g  Status:  Discontinued        3.375 g 100 mL/hr over 30 Minutes Intravenous 3 times per day 04/22/12 2120 04/22/12 2137   04/22/12 2200  piperacillin-tazobactam (ZOSYN) IVPB 2.25 g       2.25 g 100 mL/hr over 30 Minutes Intravenous 3 times per day 04/22/12 2140     04/22/12 2157   gentamicin (GARAMYCIN) injection  Status:  Discontinued          As needed 04/22/12 2158 04/22/12 2335   04/22/12 2156   clindamycin (CLEOCIN) injection  Status:  Discontinued          As needed 04/22/12 2157 04/22/12 2335   04/22/12 2145  piperacillin-tazobactam (ZOSYN) IVPB 3.375 g       3.375 g 12.5 mL/hr over 240 Minutes Intravenous  Once 04/22/12 2141 04/23/12 0145   04/22/12 2130   gentamicin (GARAMYCIN) 240 mg, clindamycin (CLEOCIN) 900 mg in sodium chloride irrigation 0.9 % 1,000 mL irrigation  Status:  Discontinued         Irrigation  Once 04/22/12 2119 04/23/12 0020   04/14/12 0830   ertapenem (INVANZ) 1 g in sodium chloride 0.9 % 50 mL IVPB        1 g 100 mL/hr over 30 Minutes Intravenous To Surgery 04/14/12 0816 04/14/12 0834   04/13/12 1359   ertapenem (INVANZ) 1 g in sodium chloride 0.9 % 50 mL IVPB  Status:  Discontinued        1 g 100 mL/hr over 30 Minutes Intravenous 60 min pre-op 04/13/12 1359 04/14/12 0816          Assessment/Plan: s/p Procedure(s) (LRB): THROMBECTOMY ARTERIOVENOUS GORE-TEX GRAFT (Left) REVISION OF ARTERIOVENOUS GORETEX GRAFT (Left) Continue abx, drain Await return of bowel function.  LOS: 30 days    Stephen Sime K. 05/02/2012

## 2012-05-03 ENCOUNTER — Inpatient Hospital Stay (HOSPITAL_COMMUNITY): Payer: Medicare Other

## 2012-05-03 LAB — TYPE AND SCREEN
ABO/RH(D): B POS
Unit division: 0
Unit division: 0

## 2012-05-03 LAB — GLUCOSE, CAPILLARY
Glucose-Capillary: 156 mg/dL — ABNORMAL HIGH (ref 70–99)
Glucose-Capillary: 165 mg/dL — ABNORMAL HIGH (ref 70–99)

## 2012-05-03 LAB — CBC
HCT: 30.5 % — ABNORMAL LOW (ref 39.0–52.0)
Hemoglobin: 10.7 g/dL — ABNORMAL LOW (ref 13.0–17.0)
MCH: 28.8 pg (ref 26.0–34.0)
MCH: 28.4 pg (ref 26.0–34.0)
MCHC: 33.8 g/dL (ref 30.0–36.0)
MCV: 84.9 fL (ref 78.0–100.0)
RBC: 3.71 MIL/uL — ABNORMAL LOW (ref 4.22–5.81)
RDW: 18.3 % — ABNORMAL HIGH (ref 11.5–15.5)
WBC: 26.4 10*3/uL — ABNORMAL HIGH (ref 4.0–10.5)

## 2012-05-03 LAB — COMPREHENSIVE METABOLIC PANEL
Albumin: 1.5 g/dL — ABNORMAL LOW (ref 3.5–5.2)
BUN: 76 mg/dL — ABNORMAL HIGH (ref 6–23)
Calcium: 9.8 mg/dL (ref 8.4–10.5)
Creatinine, Ser: 5.67 mg/dL — ABNORMAL HIGH (ref 0.50–1.35)
GFR calc Af Amer: 12 mL/min — ABNORMAL LOW (ref >90)
Glucose, Bld: 132 mg/dL — ABNORMAL HIGH (ref 70–99)
Potassium: 3.3 mEq/L — ABNORMAL LOW (ref 3.5–5.1)
Total Protein: 5.4 g/dL — ABNORMAL LOW (ref 6.0–8.3)

## 2012-05-03 LAB — DIFFERENTIAL
Basophils Relative: 1 % (ref 0–1)
Eosinophils Relative: 0 % (ref 0–5)
Lymphs Abs: 1.6 10*3/uL (ref 0.7–4.0)
Monocytes Absolute: 1.3 10*3/uL — ABNORMAL HIGH (ref 0.1–1.0)
Monocytes Relative: 5 % (ref 3–12)
Neutrophils Relative %: 88 % — ABNORMAL HIGH (ref 43–77)

## 2012-05-03 LAB — CHOLESTEROL, TOTAL: Cholesterol: 121 mg/dL (ref 0–200)

## 2012-05-03 LAB — PREALBUMIN: Prealbumin: 25.1 mg/dL (ref 17.0–34.0)

## 2012-05-03 MED ORDER — POTASSIUM CHLORIDE 10 MEQ/50ML IV SOLN
10.0000 meq | INTRAVENOUS | Status: AC
  Administered 2012-05-03 (×3): 10 meq via INTRAVENOUS
  Filled 2012-05-03: qty 150

## 2012-05-03 MED ORDER — MORPHINE SULFATE 2 MG/ML IJ SOLN
INTRAMUSCULAR | Status: AC
  Administered 2012-05-03: 2 mg via INTRAVENOUS
  Filled 2012-05-03: qty 1

## 2012-05-03 MED ORDER — ZINC TRACE METAL 1 MG/ML IV SOLN
INTRAVENOUS | Status: AC
  Administered 2012-05-03: 17:00:00 via INTRAVENOUS
  Filled 2012-05-03: qty 2000

## 2012-05-03 MED ORDER — MORPHINE SULFATE 4 MG/ML IJ SOLN
INTRAMUSCULAR | Status: AC
  Administered 2012-05-03: 4 mg
  Filled 2012-05-03: qty 1

## 2012-05-03 MED ORDER — MORPHINE SULFATE 2 MG/ML IJ SOLN
2.0000 mg | INTRAMUSCULAR | Status: DC | PRN
  Administered 2012-05-04 – 2012-05-05 (×2): 2 mg via INTRAVENOUS
  Administered 2012-05-05: 4 mg via INTRAVENOUS
  Administered 2012-05-06 – 2012-05-10 (×6): 2 mg via INTRAVENOUS
  Administered 2012-05-10: 4 mg via INTRAVENOUS
  Administered 2012-05-10 – 2012-06-03 (×22): 2 mg via INTRAVENOUS
  Filled 2012-05-03 (×9): qty 1
  Filled 2012-05-03: qty 2
  Filled 2012-05-03: qty 1
  Filled 2012-05-03: qty 2
  Filled 2012-05-03 (×4): qty 1
  Filled 2012-05-03: qty 2
  Filled 2012-05-03: qty 1
  Filled 2012-05-03: qty 2

## 2012-05-03 MED ORDER — FAT EMULSION 20 % IV EMUL
240.0000 mL | INTRAVENOUS | Status: AC
  Administered 2012-05-03: 240 mL via INTRAVENOUS
  Filled 2012-05-03: qty 250

## 2012-05-03 MED ORDER — HEPARIN SODIUM (PORCINE) 1000 UNIT/ML DIALYSIS
20.0000 [IU]/kg | INTRAMUSCULAR | Status: DC | PRN
  Filled 2012-05-03: qty 2

## 2012-05-03 MED ORDER — FENTANYL 25 MCG/HR TD PT72
37.5000 ug | MEDICATED_PATCH | TRANSDERMAL | Status: DC
  Administered 2012-05-03 – 2012-05-12 (×4): 37.5 ug via TRANSDERMAL
  Administered 2012-05-15: 25 ug via TRANSDERMAL
  Administered 2012-05-15: 12.5 ug via TRANSDERMAL
  Administered 2012-05-18 – 2012-05-21 (×2): 37.5 ug via TRANSDERMAL
  Administered 2012-05-24: 12.5 ug via TRANSDERMAL
  Administered 2012-05-24: 25 ug via TRANSDERMAL
  Administered 2012-05-27 – 2012-06-05 (×4): 37.5 ug via TRANSDERMAL
  Filled 2012-05-03 (×12): qty 1

## 2012-05-03 NOTE — Progress Notes (Signed)
INFECTIOUS DISEASE ATTENDING ADDENDUM:     [facility]   Date: 05/03/2012  Patient name: [name]  Medical record number: [id number]  Date of birth: 1961/06/09    This patient has been seen and discussed with the house staff. Please see their note for complete details. I concur with their findings with the following additions/corrections:   Patient is stable. His wound looks relatively  Clean on exam. He has bloody material in JP drain. Cultures from abdominal abscess growing yeast though gp clusters and pairs seen on GS. As mentioned he did have broadening of abx to include vancomycin the night before his abscess was drained (which may be clouding the interpretation of current cultures)  I do not yet wish to add coverage for MRSA or AMp R enterococcus based on pts leukocytosis. I would not that just prior to IR procedure he also had initiation of IV steroids which could have also caused demargination ofwbc.  Acey Lav 05/03/2012, 6:48 PM

## 2012-05-03 NOTE — Progress Notes (Signed)
[facility]    Date of Admission:  04/02/2012     Total days of antibiotics 12  Zosyn Day 12  Micafungin Day 10  Vanc not started as per chart  Principal Problem:  *SBO (small bowel obstruction) s/p EL/LOA and SBR x 2 Active Problems:  CKD (chronic kidney disease) stage V requiring chronic dialysis  Erythrocytosis  Thrombocytopenia  Gout  HTN (hypertension)  Ileus following gastrointestinal surgery  Chronic use of steroids  Malnutrition, calorie  Leg graft occlusion  Acute respiratory failure with hypoxia  Sinus tachycardia      . antiseptic oral rinse  15 mL Mouth Rinse q12n4p  . chlorhexidine  15 mL Mouth Rinse BID  . darbepoetin (ARANESP) injection - DIALYSIS  200 mcg Intravenous Q Sat-HD  . fentaNYL  25 mcg Transdermal Q72H  . ferric gluconate (FERRLECIT/NULECIT) IV  125 mg Intravenous Q T,Th,Sa-HD  . hydrocortisone sod succinate (SOLU-CORTEF) injection  25 mg Intravenous Q12H  . insulin aspart  0-15 Units Subcutaneous Q4H  . lip balm   Topical BID  . metoprolol  5 mg Intravenous Q6H  . micafungin (MYCAMINE) IV  100 mg Intravenous Daily  . pantoprazole (PROTONIX) IV  40 mg Intravenous Q24H  . piperacillin-tazobactam (ZOSYN)  IV  2.25 g Intravenous Q8H  . sodium chloride  10-40 mL Intracatheter Q12H    Subjective: Pt continues to complain of abdominal pain and complains of back pain. Denies chest pain, SOB, dizziness.   Objective: Temp:  [97 F (36.1 C)-98.6 F (37 C)] 97 F (36.1 C) (08/12 0627) Pulse Rate:  [64-104] 104  (08/12 0830) Resp:  [12-17] 16  (08/12 0830) BP: (145-171)/(55-97) 168/90 mmHg (08/12 0830) SpO2:  [98 %-100 %] 98 % (08/12 0730) Weight:  [63 kg (138 lb 14.2 oz)-69.4 kg (153 lb)] 69.4 kg (153 lb) (08/12 0627)  General: oriented x 3, NAD  HEENT: PERRL, EOMI, scleral icterus  Cardiac: RRR, no rubs, murmurs or gallops  Pulm: clear to auscultation bilaterally, moving normal volumes of air  Abd: soft, tender,  nondistended, BS present  Ext: warm and well perfused, no pedal edema, dry skin  Neuro: alert and oriented X3, cranial nerves II-XII grossly intact  Lab Results Lab Results  Component Value Date   WBC 27.7* 05/03/2012   HGB 10.3* 05/03/2012   HCT 30.5* 05/03/2012   MCV 84.0 05/03/2012   PLT 176 05/03/2012    Lab Results  Component Value Date   CREATININE 5.67* 05/03/2012   BUN 76* 05/03/2012   NA 128* 05/03/2012   K 3.3* 05/03/2012   CL 92* 05/03/2012   CO2 20 05/03/2012    Lab Results  Component Value Date   ALT 23 05/03/2012   AST 21 05/03/2012   ALKPHOS 119* 05/03/2012   BILITOT 1.5* 05/03/2012      Microbiology: Recent Results (from the past 240 hour(s))  CULTURE, BLOOD (ROUTINE X 2)     Status: Normal   Collection Time   04/24/12 11:35 PM      Component Value Range Status Comment   Specimen Description BLOOD LEFT ARM   Final    Special Requests BOTTLES DRAWN AEROBIC ONLY 5CC   Final    Culture  Setup Time 04/25/2012 11:02   Final    Culture NO GROWTH 5 DAYS   Final    Report Status 05/01/2012 FINAL   Final   CULTURE, BLOOD (ROUTINE X 2)     Status: Normal   Collection Time  04/24/12 11:40 PM      Component Value Range Status Comment   Specimen Description BLOOD LEFT HAND   Final    Special Requests BOTTLES DRAWN AEROBIC ONLY 5CC   Final    Culture  Setup Time 04/25/2012 11:02   Final    Culture NO GROWTH 5 DAYS   Final    Report Status 05/01/2012 FINAL   Final   CULTURE, BLOOD (ROUTINE X 2)     Status: Normal (Preliminary result)   Collection Time   04/28/12  3:38 PM      Component Value Range Status Comment   Specimen Description BLOOD RIGHT HAND   Final    Special Requests BOTTLES DRAWN AEROBIC ONLY 5CC   Final    Culture  Setup Time 04/28/2012 22:13   Final    Culture     Final    Value:        BLOOD CULTURE RECEIVED NO GROWTH TO DATE CULTURE WILL BE HELD FOR 5 DAYS BEFORE ISSUING A FINAL NEGATIVE REPORT   Report Status PENDING   Incomplete   CULTURE, BLOOD (ROUTINE X  2)     Status: Normal (Preliminary result)   Collection Time   04/28/12  3:48 PM      Component Value Range Status Comment   Specimen Description BLOOD RIGHT ARM   Final    Special Requests BOTTLES DRAWN AEROBIC ONLY 5CC   Final    Culture  Setup Time 04/28/2012 22:12   Final    Culture     Final    Value:        BLOOD CULTURE RECEIVED NO GROWTH TO DATE CULTURE WILL BE HELD FOR 5 DAYS BEFORE ISSUING A FINAL NEGATIVE REPORT   Report Status PENDING   Incomplete   ANAEROBIC CULTURE     Status: Normal (Preliminary result)   Collection Time   04/30/12  5:15 PM      Component Value Range Status Comment   Specimen Description ABSCESS ABDOMEN   Final    Special Requests 4 SYRINGES @ 60CC   Final    Gram Stain     Final    Value: ABUNDANT WBC PRESENT, PREDOMINANTLY PMN     NO SQUAMOUS EPITHELIAL CELLS SEEN     ABUNDANT GRAM POSITIVE COCCI IN PAIRS     IN CLUSTERS   Culture     Final    Value: NO ANAEROBES ISOLATED; CULTURE IN PROGRESS FOR 5 DAYS   Report Status PENDING   Incomplete   FUNGUS CULTURE W SMEAR     Status: Normal (Preliminary result)   Collection Time   04/30/12  5:15 PM      Component Value Range Status Comment   Specimen Description ABSCESS ABDOMEN   Final    Special Requests 4 SYRINGES @ 60CC   Final    Fungal Smear NO YEAST OR FUNGAL ELEMENTS SEEN   Final    Culture CULTURE IN PROGRESS FOR FOUR WEEKS   Final    Report Status PENDING   Incomplete   CULTURE, ROUTINE-ABSCESS     Status: Normal (Preliminary result)   Collection Time   04/30/12  5:15 PM      Component Value Range Status Comment   Specimen Description ABSCESS ABDOMEN   Final    Special Requests 4 SYRINGES @ 60CC   Final    Gram Stain     Final    Value: ABUNDANT WBC PRESENT, PREDOMINANTLY PMN     NO  SQUAMOUS EPITHELIAL CELLS SEEN     ABUNDANT GRAM POSITIVE COCCI IN PAIRS     IN CLUSTERS   Culture FEW YEAST CONSISTENT WITH CANDIDA SPECIES   Final    Report Status PENDING   Incomplete     Studies/Results: No  results found.  Assessment: Abscess drainage showed GPC in pairs in clusters - S. Aureus. Coagulase negative or positive bacteria has yet to be determined.  Plan: 1. Peritonitis/Abscess  Abscess stain show GPC in pairs in clusters indicating S. Aureus, plus few yeast consistent with Candida. Cultures have not grown GPC to this date. - WBC count - 27.7 plus abdominal pain  - continue Zosyn, continue Micafungin  - Pt had a dose of vancomycin given early morning on 04/30/12 therefore culture may be negative. Will keep medications as is for now and will reassess tomorrow. Elevated WBC count could be 2/2 chronic steroid use. Will consider to Add Vanc at a later date. As GFR is 12, dose at 15 mg/kg X 1, then usual dose. Pharm to dose once vancomycin is added. - awaiting coagulase/sensitivity studies for GPC    Sherryll Burger [facility] 05/03/2012, 9:02 AM  This is a Psychologist, occupational Note. The care of the patient was discussed with Dr. Daiva Eves and the assessment and plan was formulated with their assistance. Please see their note for official documentation of the patient encounter.

## 2012-05-03 NOTE — Progress Notes (Addendum)
.  NGT output seems to be trending down [facility] Progress Note  Subjective:  On dialysis, says "you're giving me too much and then having to take it off, it hurts" regarding extra dialysis due to TNA. Not in pain currently. I/O yest were 1960 in/330 out (including 225NG and 105drains).   Objective Filed Vitals:   05/03/12 0800 05/03/12 0830 05/03/12 0900 05/03/12 0930  BP: 165/97 168/90 149/83 149/84  Pulse: 97 104 108 109  Temp:      TempSrc:      Resp: 16 16 23 17   Height:      Weight:      SpO2:       Physical Exam General: seems more comfortable, NG in place Heart: RRR Lungs: mostly clear Abdomen: distended, slightly painful.  I can appreciate bowel sounds. Drain in RLQ Extremities: L leg 2+ asymmetric edema throughout leg, lower>upper. No edema RLE Dialysis Access: left thigh AVGG patent  Dialysis Orders: Center: [facility] on TTS.  EDW 52.5 kg HD Bath 2K/2Ca Time 3.5 hrs Heparin 5000 U. Access AVG @ left thigh BFR 400 DFR 800 Zemplar 0 mcg IV/HD Epogen 0 Units IV/HD Venofer 50/week; last iPTH 314, but product high and no zemplar   Assessment/Plan:  1. SBO, s/p exp lap/SB resection 7/24 Dr. Luisa Hart, complicated by dehiscence with peritonitis, back to OR 8/1 for further SB resection and reanastomosis; Now s/p perc drainage of intraabd abcesses on 8/8. On IV zosyn and mycofungin.   Still with ileus/NGT per surgery. Also needing hydrotherapy to open wound.  2. ESRD - HD normally  TTS at [facility] temp R groin cath still in place, they are using for IV access; thigh graft s/p declot is functioning ok.  HD daily for a while to keep up with volume.  4 K bath. HD today and tomorrow. Asked staff to reweigh pt with careful calibration.  Up 15-20 kg since 8/7, ?accuracy 3. Hypertension/volume - was on no BP meds at home. BP has been variable here.  Conidine stopped and hemodynamics seem improved.  Keep metoprolol given sinus tach.   Steroids being weaned. Daily HD should keep in  check.  Is hyponatremic due to volume overload, HD will help.   4. Anemia of CKD - Getting max Aranesp. Hgb 10's; on full course of IV Fe; ferritin 552. PRN transfusions, received 2 units last wk on 8/8 and 8/9. 5. Metabolic bone disease - No binders due to low phos being repleted.  Phos now within normal limits 6. Chronic LLE DVT- popliteal by [place] 8/9.  Asymmetric left leg edema may be related to this and to presence of L thigh access and vol overload also.  7. Protein calorie malnutrition- Pre-albumin very low; on TNA 70cc/hr (1680 = any other fluids) today. Getting lipids MWF due to shortage; getting phos and K repletion today 8. S/p renal transplant - at [facility] in '87, started HD in 04/2003, remains on Prednisone. 9. Hepatitis C/thrombocytopenia - stable  Vinson Moselle  MD [facility] [phone] pgr    [phone] cell 05/03/2012, 11:24 AM      Additional Objective Labs: Basic Metabolic Panel:  Lab 05/03/12 4782 05/02/12 0452 05/01/12 1002 04/30/12 0724 04/30/12 0500  NA 128* 132* 129* -- --  K 3.3* 3.5 3.6 -- --  CL 92* 96 93* -- --  CO2 20 23 21  -- --  GLUCOSE 132* 154* 120* -- --  BUN 76* 45* 60* -- --  CREATININE 5.67* 3.61* 4.31* -- --  CALCIUM 9.8 9.3 9.4 -- --  ALB -- -- -- -- --  PHOS -- -- 2.7 2.7 3.3   Liver Function Tests:  Lab 05/03/12 0623 05/01/12 1002 04/30/12 0724 04/29/12 0710  AST 21 -- -- 24  ALT 23 -- -- 14  ALKPHOS 119* -- -- 70  BILITOT 1.5* -- -- 1.2  PROT 5.4* -- -- 4.8*  ALBUMIN 1.5* 1.5* 1.6* --   CBC:  Lab 05/03/12 0623 05/03/12 0535 05/01/12 1002 04/30/12 1352 04/30/12 0500  WBC 27.7* 26.4* 19.2* -- --  NEUTROABS -- PENDING -- -- 19.1*  HGB 10.3* 10.7* 10.1* -- --  HCT 30.5* 31.5* 28.8* -- --  MCV 84.0 84.9 84.0 83.3 81.8  PLT 176 169 180 -- --  Blood Culture    Component Value Date/Time   SDES ABSCESS ABDOMEN 04/30/2012 1715   SDES ABSCESS ABDOMEN 04/30/2012 1715   SDES ABSCESS ABDOMEN 04/30/2012 1715   SPECREQUEST 4 SYRINGES  @ 60CC 04/30/2012 1715   SPECREQUEST 4 SYRINGES @ 60CC 04/30/2012 1715   SPECREQUEST 4 SYRINGES @ 60CC 04/30/2012 1715   CULT NO ANAEROBES ISOLATED; CULTURE IN PROGRESS FOR 5 DAYS 04/30/2012 1715   CULT CULTURE IN PROGRESS FOR FOUR WEEKS 04/30/2012 1715   CULT FEW YEAST CONSISTENT WITH CANDIDA SPECIES 04/30/2012 1715   REPTSTATUS PENDING 04/30/2012 1715   REPTSTATUS PENDING 04/30/2012 1715   REPTSTATUS PENDING 04/30/2012 1715  Cardiac Enzymes:  Lab 04/28/12 1100 04/28/12 0236 04/27/12 2130 04/27/12 1300 04/27/12 0525  CKTOTAL 46 32 22 24 23   CKMB 4.1* 4.8* 5.4* 7.1* 4.7*  CKMBINDEX -- -- -- -- --  TROPONINI 1.91* 2.14* 1.49* 2.32* 1.27*  CBG:  Lab 05/03/12 0451 05/03/12 0029 05/02/12 2032 05/02/12 1546 05/02/12 1144  GLUCAP 156* 166* 179* 161* 160*  Medications:    . fat emulsion    . TPN (CLINIMIX) +/- additives 70 mL/hr at 05/01/12 1751  . TPN (CLINIMIX) +/- additives 70 mL/hr at 05/02/12 1716  . TPN (CLINIMIX) +/- additives        . antiseptic oral rinse  15 mL Mouth Rinse q12n4p  . chlorhexidine  15 mL Mouth Rinse BID  . darbepoetin (ARANESP) injection - DIALYSIS  200 mcg Intravenous Q Sat-HD  . fentaNYL  25 mcg Transdermal Q72H  . ferric gluconate (FERRLECIT/NULECIT) IV  125 mg Intravenous Q T,Th,Sa-HD  . hydrocortisone sod succinate (SOLU-CORTEF) injection  25 mg Intravenous Q12H  . insulin aspart  0-15 Units Subcutaneous Q4H  . lip balm   Topical BID  . metoprolol  5 mg Intravenous Q6H  . micafungin (MYCAMINE) IV  100 mg Intravenous Daily  . pantoprazole (PROTONIX) IV  40 mg Intravenous Q24H  . piperacillin-tazobactam (ZOSYN)  IV  2.25 g Intravenous Q8H  . potassium chloride  10 mEq Intravenous Q1 Hr x 3  . sodium chloride  10-40 mL Intracatheter Q12H

## 2012-05-03 NOTE — Progress Notes (Addendum)
Nutrition Follow-up  Intervention:   1. Continue TPN per pharmacy 2. RD to continue to follow nutrition care plan   Assessment:   Underwent percutaneous drain placement for post-op R lower quadrant abscess on 8/9.  Continues to receive hydrotherapy for open wound. Did not tolerate clamping of NG tube on 8/8. NGT output slightly decreased to 225 ml/24 hours yesterday. 105 ml from drain yesterday.  Continues to receive HD on most days of the week. Noted prealbumin now WNL.  Patient continues to receive TPN with Clinimix 5/15 @ 70 ml/hr. Lipids (20% IVFE @ 10 ml/hr), multivitamins, and trace elements are provided 3 times weekly (MWF) due to national backorder. Provides 1400 kcal and 84 grams protein daily (based on weekly average). Meets 82% minimum estimated kcal and 100% minimum estimated protein needs.  Diet Order:  NPO  Meds: Scheduled Meds:    . antiseptic oral rinse  15 mL Mouth Rinse q12n4p  . chlorhexidine  15 mL Mouth Rinse BID  . darbepoetin (ARANESP) injection - DIALYSIS  200 mcg Intravenous Q Sat-HD  . fentaNYL  37.5 mcg Transdermal Q72H  . ferric gluconate (FERRLECIT/NULECIT) IV  125 mg Intravenous Q T,Th,Sa-HD  . hydrocortisone sod succinate (SOLU-CORTEF) injection  25 mg Intravenous Q12H  . insulin aspart  0-15 Units Subcutaneous Q4H  . lip balm   Topical BID  . metoprolol  5 mg Intravenous Q6H  . micafungin (MYCAMINE) IV  100 mg Intravenous Daily  . morphine      . pantoprazole (PROTONIX) IV  40 mg Intravenous Q24H  . piperacillin-tazobactam (ZOSYN)  IV  2.25 g Intravenous Q8H  . potassium chloride  10 mEq Intravenous Q1 Hr x 3  . sodium chloride  10-40 mL Intracatheter Q12H  . DISCONTD: fentaNYL  25 mcg Transdermal Q72H   Continuous Infusions:    . fat emulsion    . TPN (CLINIMIX) +/- additives 70 mL/hr at 05/01/12 1751  . TPN (CLINIMIX) +/- additives 70 mL/hr at 05/02/12 1716  . TPN (CLINIMIX) +/- additives     PRN Meds:.acetaminophen, acetaminophen,  albuterol, heparin, heparin, hydrALAZINE, morphine injection, ondansetron (ZOFRAN) IV, ondansetron, promethazine, sodium chloride, DISCONTD: heparin, DISCONTD:  morphine injection  Labs:  CMP     Component Value Date/Time   NA 128* 05/03/2012 0623   K 3.3* 05/03/2012 0623   CL 92* 05/03/2012 0623   CO2 20 05/03/2012 0623   GLUCOSE 132* 05/03/2012 0623   BUN 76* 05/03/2012 0623   CREATININE 5.67* 05/03/2012 0623   CALCIUM 9.8 05/03/2012 0623   PROT 5.4* 05/03/2012 0623   ALBUMIN 1.5* 05/03/2012 0623   AST 21 05/03/2012 0623   ALT 23 05/03/2012 0623   ALKPHOS 119* 05/03/2012 0623   BILITOT 1.5* 05/03/2012 0623   GFRNONAA 10* 05/03/2012 0623   GFRAA 12* 05/03/2012 0623   Phosphorus  Date/Time Value Range Status  05/01/2012 10:02 AM 2.7  2.3 - 4.6 mg/dL Final  0/04/6577  4:69 AM 2.7  2.3 - 4.6 mg/dL Final  02/22/9527  4:13 AM 3.3  2.3 - 4.6 mg/dL Final   Potassium  Date/Time Value Range Status  05/03/2012  6:23 AM 3.3* 3.5 - 5.1 mEq/L Final  05/02/2012  4:52 AM 3.5  3.5 - 5.1 mEq/L Final  05/01/2012 10:02 AM 3.6  3.5 - 5.1 mEq/L Final   Magnesium  Date/Time Value Range Status  04/29/2012  7:10 AM 1.6  1.5 - 2.5 mg/dL Final  10/26/4008  2:72 AM 1.9  1.5 - 2.5 mg/dL Final  01/23/6643  8:45 PM 1.8  1.5 - 2.5 mg/dL Final   Prealbumin  Date/Time Value Range Status  05/03/2012  5:35 AM 25.1  17.0 - 34.0 mg/dL Final  10/26/4008  2:72 AM 7.7* 17.0 - 34.0 mg/dL Final  5/36/6440  3:47 AM 9.5* 17.0 - 34.0 mg/dL Final    Intake/Output Summary (Last 24 hours) at 05/03/12 1456 Last data filed at 05/03/12 1110  Gross per 24 hour  Intake   1310 ml  Output   3730 ml  Net  -2420 ml  BM 8/9  Weight Status:  63.8 kg s/p HD on 8/12 - stabilizing; overall gain 65.7 kg s/p HD on 8/8  50.5 kg s/p HD on 8/6 50 kg s/p HD on 8/5 47.8 kg s/p HD on 7/30  48.3 kg s/p HD on 7/27  Body mass index is 19.62 kg/(m^2). Weight is WNL.  Estimated needs:  [calculated using the Kidney Disease Outcomes Quality Initiative (KDOQI)  Adjustment Equation for the Underweight Patient]: 1700 - 1950 kcal, 80 - 100 grams protein daily  Nutrition Dx:  Inadequate oral intake now R/T GI distress AEB pt report. Ongoing.  Goal:  Initiation of TPN; intake to meet at least 90% of estimated needs. Unmet.  Monitor:  Diet advancement, TPN adequacy, weights, labs, I/O's  Jarold Motto MS, RD, LDN Pager: [phone] After-hours pager: [phone]

## 2012-05-03 NOTE — Progress Notes (Signed)
Subjective: Pt ok. Seen while on HD. No new c/o  Objective: Physical Exam: BP 149/84  Pulse 109  Temp 97 F (36.1 C) (Oral)  Resp 17  Ht 5\' 11"  (1.803 m)  Wt 153 lb (69.4 kg)  BMI 21.34 kg/m2  SpO2 98% RLQ drain intact, site clean, NT 105cc recorded yesterday, output is cloudy blood tinged this am.    Labs: CBC  Basename 05/03/12 0623 05/03/12 0535  WBC 27.7* 26.4*  HGB 10.3* 10.7*  HCT 30.5* 31.5*  PLT 176 169   BMET  Basename 05/03/12 0623 05/02/12 0452  NA 128* 132*  K 3.3* 3.5  CL 92* 96  CO2 20 23  GLUCOSE 132* 154*  BUN 76* 45*  CREATININE 5.67* 3.61*  CALCIUM 9.8 9.3   LFT  Basename 05/03/12 0623  PROT 5.4*  ALBUMIN 1.5*  AST 21  ALT 23  ALKPHOS 119*  BILITOT 1.5*  BILIDIR --  IBILI --  LIPASE --   PT/INR  Basename 04/30/12 1354  LABPROT 14.9  INR 1.15     Studies/Results: CX: GPC and yeast so far.  Assessment/Plan: s/p RLQ abscess drainage 8/9 WBC remains elevated Follow up CT as output declines.   LOS: 31 days    Brayton El PA-C 05/03/2012 10:40 AM

## 2012-05-03 NOTE — Progress Notes (Signed)
Patient ID: [name], male   DOB: 1961/07/20, 51 y.o.   MRN: [id number] 6 Days Post-Op  Subjective: Just returned from dialysis, tired, c/o back pain; states that MS not helping.  Does have order for fentanyl patch (25 mcg) per nurse this was changed Saturday last.  Objective: Vital signs in last 24 hours: Temp:  [97 F (36.1 C)-98.3 F (36.8 C)] 98.2 F (36.8 C) (08/12 1204) Pulse Rate:  [64-120] 104  (08/12 1204) Resp:  [12-23] 17  (08/12 1204) BP: (117-171)/(55-97) 145/73 mmHg (08/12 1204) SpO2:  [98 %-100 %] 100 % (08/12 1204) Weight:  [138 lb 14.2 oz (63 kg)-153 lb (69.4 kg)] 140 lb 10.5 oz (63.8 kg) (08/12 1204) Last BM Date: 04/30/12  Intake/Output from previous day: 08/11 0701 - 08/12 0700 In: 1960 [IV Piggyback:260; TPN:1680] Out: 330 [Emesis/NG output:225; Drains:105] Intake/Output this shift: Total I/O In: -  Out: 3400 [Other:3400]  Wound clean, minimal drainage Minimal drainage from perc drain (105 ml for past 24 hrs recorded) NG remains in place draining dark liquid. (scheduled 6 hours to active sxn and 6 hours off) (225 ml recorded as output for past 24 hrs) WBC trending upward 27,700 ON Zosyn, Micafungin.  Culture from abdomen 04/30/12 Gram + in pairs.   Lab Results:   [facility] 05/03/12 0623 05/03/12 0535  WBC 27.7* 26.4*  HGB 10.3* 10.7*  HCT 30.5* 31.5*  PLT 176 169   BMET  Basename 05/03/12 0623 05/02/12 0452  NA 128* 132*  K 3.3* 3.5  CL 92* 96  CO2 20 23  GLUCOSE 132* 154*  BUN 76* 45*  CREATININE 5.67* 3.61*  CALCIUM 9.8 9.3   PT/INR  Basename 04/30/12 1354  LABPROT 14.9  INR 1.15   ABG No results found for this basename: PHART:2,PCO2:2,PO2:2,HCO3:2 in the last 72 hours  Studies/Results: No results found.  Anti-infectives: Anti-infectives     Start     Dose/Rate Route Frequency Ordered Stop   04/30/12 1400   vancomycin (VANCOCIN) 750 mg in sodium chloride 0.9 % 150 mL IVPB  Status:  Discontinued        750 mg 150 mL/hr over  60 Minutes Intravenous  Once 04/30/12 1212 04/30/12 1507   04/29/12 2330   vancomycin (VANCOCIN) 1,500 mg in sodium chloride 0.9 % 500 mL IVPB        1,500 mg 250 mL/hr over 120 Minutes Intravenous  Once 04/29/12 2259 04/30/12 0246   04/27/12 0000   ceFAZolin (ANCEF) IVPB 1 g/50 mL premix  Status:  Discontinued     Comments: Send with pt to OR      1 g 100 mL/hr over 30 Minutes Intravenous On call 04/26/12 0927 04/26/12 0928   04/24/12 1000   micafungin (MYCAMINE) 100 mg in sodium chloride 0.9 % 100 mL IVPB        100 mg 100 mL/hr over 1 Hours Intravenous Daily 04/24/12 0750     04/23/12 1600   fluconazole (DIFLUCAN) IVPB 400 mg        400 mg 200 mL/hr over 60 Minutes Intravenous  Once 04/23/12 1428 04/23/12 1800   04/22/12 2200   piperacillin-tazobactam (ZOSYN) IVPB 3.375 g  Status:  Discontinued        3.375 g 100 mL/hr over 30 Minutes Intravenous 3 times per day 04/22/12 2120 04/22/12 2137   04/22/12 2200   piperacillin-tazobactam (ZOSYN) IVPB 2.25 g        2.25 g 100 mL/hr over 30 Minutes Intravenous 3 times per  day 04/22/12 2140     04/22/12 2157   gentamicin (GARAMYCIN) injection  Status:  Discontinued          As needed 04/22/12 2158 04/22/12 2335   04/22/12 2156   clindamycin (CLEOCIN) injection  Status:  Discontinued          As needed 04/22/12 2157 04/22/12 2335   04/22/12 2145   piperacillin-tazobactam (ZOSYN) IVPB 3.375 g        3.375 g 12.5 mL/hr over 240 Minutes Intravenous  Once 04/22/12 2141 04/23/12 0145   04/22/12 2130   gentamicin (GARAMYCIN) 240 mg, clindamycin (CLEOCIN) 900 mg in sodium chloride irrigation 0.9 % 1,000 mL irrigation  Status:  Discontinued         Irrigation  Once 04/22/12 2119 04/23/12 0020   04/14/12 0830   ertapenem (INVANZ) 1 g in sodium chloride 0.9 % 50 mL IVPB        1 g 100 mL/hr over 30 Minutes Intravenous To Surgery 04/14/12 0816 04/14/12 0834   04/13/12 1359   ertapenem (INVANZ) 1 g in sodium chloride 0.9 % 50 mL IVPB  Status:   Discontinued        1 g 100 mL/hr over 30 Minutes Intravenous 60 min pre-op 04/13/12 1359 04/14/12 0816          Assessment/Plan: s/p Procedure(s) (LRB): THROMBECTOMY ARTERIOVENOUS GORE-TEX GRAFT (Left) REVISION OF ARTERIOVENOUS GORETEX GRAFT (Left)  Continue abx, drain Recommend ID consult for increasing WBC, patient has been on numerous ABX. Await return of bowel function.    LOS: 31 days    Abbie Berling 05/03/2012

## 2012-05-03 NOTE — Progress Notes (Signed)
PARENTERAL NUTRITION CONSULT NOTE - FOLLOW UP  Pharmacy Consult for TPN Indication: SBO, s/p LOA/SBR/anastamosis , s/p LOA/SBR/re-anastamosis with patch repair for leak  Allergies  Allergen Reactions  . Allopurinol     REACTION: decreased platelets  . Aspirin     REACTION: unspecified    Patient Measurements: Height: 5\' 11"  (180.3 cm) Weight: 153 lb (69.4 kg) (pt weighed in bed) IBW/kg (Calculated) : 75.3   Vital Signs: Temp: 97 F (36.1 C) (08/12 0627) Temp src: Oral (08/12 0627) BP: 149/83 mmHg (08/12 0900) Pulse Rate: 108  (08/12 0900) Intake/Output from previous day: 08/11 0701 - 08/12 0700 In: 1960 [IV Piggyback:260; TPN:1680] Out: 330 [Emesis/NG output:225; Drains:105] Intake/Output from this shift:    Labs:  [facility] 05/03/12 0623 05/03/12 0535 05/01/12 1002 04/30/12 1354  WBC 27.7* 26.4* 19.2* --  HGB 10.3* 10.7* 10.1* --  HCT 30.5* 31.5* 28.8* --  PLT 176 169 180 --  APTT -- -- -- 37  INR -- -- -- 1.15     Basename 05/03/12 0623 05/03/12 0535 05/02/12 0452 05/01/12 1002  NA 128* -- 132* 129*  K 3.3* -- 3.5 3.6  CL 92* -- 96 93*  CO2 20 -- 23 21  GLUCOSE 132* -- 154* 120*  BUN 76* -- 45* 60*  CREATININE 5.67* -- 3.61* 4.31*  LABCREA -- -- -- --  CREAT24HRUR -- -- -- --  CALCIUM 9.8 -- 9.3 9.4  MG -- -- -- --  PHOS -- -- -- 2.7  PROT 5.4* -- -- --  ALBUMIN 1.5* -- -- 1.5*  AST 21 -- -- --  ALT 23 -- -- --  ALKPHOS 119* -- -- --  BILITOT 1.5* -- -- --  BILIDIR -- -- -- --  IBILI -- -- -- --  PREALBUMIN -- -- -- --  TRIG -- 71 -- --  CHOLHDL -- -- -- --  CHOL -- 121 -- --   Estimated Creatinine Clearance: 15 ml/min (by C-G formula based on Cr of 5.67).    Basename 05/03/12 0451 05/03/12 0029 05/02/12 2032  GLUCAP 156* 166* 179*    Insulin Requirements in the past 24 hours:  9 units Novolog. Currently on Moderate SSI q4h with 10 units insulin in TNA  Current Nutrition:  NPO, Clinimix 5/15 at 70 ml/hr with lipids at 10 ml/hr MWF  only  Admit:  Admitted 7/13 with abd pain, weight loss, SBO. S/p LOA, SBR 724 and repeat LOA, SBR with anasomotic leak repair 8/1 , thrombectomy with AVG revision 7/25 and repeat thrombectomy 8/6.  GI: Has been essentially NPO since admit, therefore at significant risk for re-feeding, but has been tolerating TPN thus far. NGT output slightly decrease to 225/24h yesterday. Endo: Moderate SSI q4, on Solu-Cortef-25mg  bid. CBG control is fair, goal < 150. Lytes:  Na low, likely d/t overload and Clinimix without supplemented lytes. Corrected Ca =11.8,slightly high despite no lytes in TNA. Ca x PO4 product ok on last check 8/10. K has been trending down and is low now. Goals for lytes with ileus are K ~4, Mg ~2 and Phos ~3.5 Renal:  ESRD w/HD typically T/TH/Sat - currently receiving daily HD d/t fluid overload. On Ferrlecit, Aranesp-Sat Pulm: extubated; now on RA Cards: hypertensive. HR 66-100s- on scheduled lopressor and prn hydralazine.  Hepatobil: LFTs, trig and chol WNL.  Alkphos and tbil are elevated. Albumin is low, expected with ESRD. Prealbumin pending. Neuro: GCS 15,  No pain reported. ID: Afeb, WBC cont to incr, abd abscess cx ngtd- on  Mycamine and renally adjusted Zosyn Best Practices:  Mouthcare, PPI-IV  Nutritional Goals:  1700-1950 kCal, 75-85 grams of protein per day  Plan:  - Continue Clinimix 5/15 at 70 ml/hr, which meets protein goal but is just below kcal. - Provide available trace elements, MVI and lipids @10cc /hr on MWF only d/t Acupuncturist. - Increase insulin to 15 units/bag - Will give 3 runs of K today - F/u CBGs, prealbumin, Mg, Phos and BMET in AM  Gitel Beste K. Allena Katz, PharmD, BCPS.  Clinical Pharmacist Pager [phone]. 05/03/2012 9:27 AM

## 2012-05-03 NOTE — Procedures (Signed)
I was present at this dialysis session. I have reviewed the session itself and made appropriate changes.   Vinson Moselle, MD [employer] 05/03/2012, 11:35 AM

## 2012-05-03 NOTE — Plan of Care (Signed)
Problem: Discharge Progression Outcomes Goal: Tolerating diet Outcome: Not Applicable Date Met:  05/03/12 Currently NPO TNA  infusing

## 2012-05-03 NOTE — Progress Notes (Signed)
Hydrotherapy Note:   05/03/12 1351  Subjective Assessment  Subjective 8/12  Patient and Family Stated Goals Get well.  Date of Onset 04/02/12  Prior Treatments Dressing changes  Evaluation and Treatment  Evaluation and Treatment Procedures Explained to Patient/Family Yes  Evaluation and Treatment Procedures agreed to  Wound 04/27/12 Other (Comment) Abdomen Mid Midline abdominal incisional wound  Date First Assessed/Time First Assessed: 04/27/12 1326   Wound Type: Other (Comment)  Location: Abdomen  Location Orientation: Mid  Wound Description (Comments): Midline abdominal incisional wound  Site / Wound Assessment Yellow;Red;Black  % Wound base Red or Granulating 55%  % Wound base Yellow 40%  % Wound base Black 5%  Peri-wound Assessment Intact  Margins Unattacted edges (unapproximated)  Closure Sutures  Drainage Amount Minimal  Drainage Description Serosanguineous;No odor  Non-staged Wound Description Partial thickness  Treatment Hydrotherapy (Pulse lavage);Debridement (Selective);Packing (Saline gauze) (NS W to D 4x4, dry 4x4, abd, skin prep, tape)  Dressing Type Moist to dry;ABD (NS W to D 4x4, dry 4x4, abd, skin prep, tape)  Dressing Changed New  Dressing Status Clean  Hydrotherapy  Pulsed Lavage with Suction (psi) 8 psi (4-8 psi)  Pulsed Lavage with Suction - Normal Saline Used 1000 mL  Pulsed Lavage Tip Tip with splash shield  Pulsed lavage therapy - wound location Abdomen  Selective Debridement  Selective Debridement - Location Abdomen  Selective Debridement - Tools Used Forceps;Scissors  Selective Debridement - Tissue Removed Yellow and black slough.  Wound Therapy - Assess/Plan/Recommendations  Wound Therapy - Clinical Statement Pt is a 51 y/o male with an abdominal midline incisional wound.  Pt would benefit from continued hydrotherapy to assist with cleansing and debridement while facilitating wound healing.  Pt with continued improvement with debridement today with  increased granulation tissue present.  Hopeful for possible VAC placement this week with MD notification if wound continues to improve.  Wound Therapy - Functional Problem List Decreased skin integrity causing increased chance for infection.  Factors Delaying/Impairing Wound Healing Infection - systemic/local;Immobility;Multiple medical problems;Polypharmacy  Hydrotherapy Plan Debridement;Dressing change;Patient/family education;Pulsatile lavage with suction  Wound Therapy - Frequency 6X / week  Wound Therapy - Current Recommendations PT;OT;Surgery consult;WOC nurse  Wound Therapy - Follow Up Recommendations Home health RN  Wound Plan Pulse lavage, debridement, dressing change 6x/week.  Wound Therapy Goals - Improve the function of patient's integumentary system by progressing the wound(s) through the phases of wound healing by:  Decrease Necrotic Tissue - Progress Progressing toward goal  Increase Granulation Tissue - Progress Progressing toward goal  Improve Drainage Characteristics - Progress Progressing toward goal  Goals/treatment plan/discharge plan were made with and agreed upon by patient/family Yes  Wound Therapy - Potential for Goals Good    Pain: No c/o from patient with session.  05/03/2012 Cephus Shelling, PT, DPT [phone]

## 2012-05-03 NOTE — Progress Notes (Signed)
[facility] PROGRESS NOTE  TOBIAS AVITABILE ZOX:[id number] DOB: 05-17-1961 DOA: 04/02/2012 PCP: Trevor Iha, MD  Brief narrative: 51 y/o male with ESRD and Prostatectomy in 2012. One day prior to admission he started having abdominal pain which progressively got worse - mainly periumbilical - nothing made it better or worse - had no radiation.  He started vomiting about 3 or 4 times and decided to come to the emergency room.  In the emergency room a CT scan of the abdomen and pelvis was done that showed small bowel obstruction.On 7/24 he underwent lysis of adhesions and partial small bowel resection (iliectomy and partial jejunectomy).Unfortunately on 7//25 he developed a clot in his L femoral AV graft requiring thrombectomy. Small bowel obstruction/ileus symptoms persisted and he subsequently went back to OR on 8/2 for what was found to be a 3cm anastomotic leak with peritonitis. Because of anticipated hemodynamic instability in a malnourished immunocompromised renal patient he was sent to the step down unit for further evaluation and monitoring.  Assessment/Plan:  SBO (small bowel obstruction) s/p lysis of adhesions and partial small bowel resection 7/24 *Subsequent anastomosis dehiscence / SBO s/p lysis of adhesions, small bowel resection, omental patch of anastomosis 8/2  *CT abdomen and pelvis 04/29/2012 demonstrated several fluid collections consistent with intra-abdominal abscess with the largest measuring 16.5 x 13.7 x 6.4 cm.  *Patient underwent percutaneous drainage of largest fluid collection 04/30/2012 *Has open abdom wound and is receiving hydrotherapy   Peritonitis secondary to anastomotic dehiscence/intra-abdominal abscess *Cont zosyn and micafungin as per ID *Surgery following wound-see above regarding multiple intra-abdominal postoperative abscess and percutaneous drain insertion *Infectious disease service was consulted on 04/29/2012 *cultures suggesting gram + cocci  in pairs and groups - ?if Vanc now indicated - will await reccs from ID before adjusting abx - given persistent/increasing leukocytosis I suspect Vanc will be needed   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 *An attempt was made on 04/21/2012 to place a central line in interventional radiology under fluoroscopy but unfortunately central venous access could not be placed due to upper extremity and internal jugular vein access being chronically occluded bilaterally therefore rationale for why we unfortunately have to continue to use this patient's right femoral Vas-Cath port for parenteral nutrition  Chronic nonocclusive left lower extremity DVT Diagnosed via Doppler 04/30/2012  Ileus following gastrointestinal surgery *Cont TNA via right femoral Vas-Cath port per surgery *As above - anticipated protracted postoperative course  Atypical chest pain/abnormal cardiac enzyme *Had episode of severe chest pain during this hospitalization. CT of the chest revealed no evidence of pulmonary embolism or aortic dissection *Suspect mildly elevated cardiac isoenzymes related to demand ischemia versus recent stressors of recurrent surgical procedures *Multiple EKGs have been performed since admission including during episode of severe pain on Saturday evening and showed no definitive ischemic changes  CKD (chronic kidney disease) stage V requiring chronic dialysis *Dialysis per nephrology  Hypokalemia/hypophosphatemia/hyponatremia *Management per nephrology service/TNA  Thrombocytopenia *Recurrent this admission x 2 episodes  Normocytic Anemia  *Due to acute blood loss and due to chronic disease *Received packed red blood cells on 04/25/2012 *Cont aranesp and ferric gluconate  HTN (hypertension) Continue to follow without change today - frequent HD is assisting in control   Chronic use of steroids On very slow steroid taper  Malnutrition, calorie *Patient cachectic at  presentation  *Now with acute malnutrition related to postoperative ileus and surgical complications *Parenteral nutrition managed by surgery and pharmacy  Leg dialysis graft occlusion *s/p thrombectomy 7/25 -  unfortunately re clotted-has undergone an additional thrombectomy procedure with revision of arterial and graft on 04/27/2012 *Currently being dialyzed via groin HD catheter- VVS following  DVT prophylaxis: SCDs Code Status: full  Disposition Plan: Remain in step down  Consultants: General Surgery [facility] Infectious disease  Events since admission: 7/13 CT Abdomen with SBO  7/24 Ex-lap, lysis of adhesions, partial small bowel resection x2  7/25 Thrombectomy L femoral AV graft  7/26 Extubated, CVL is out  7/27 Transferred to telemetry under [facility]  8/2 Acute abdomen, dehiscence of anastomosis, SBO, back from OR intubated in ICU on PCCM service. Found to have moderate peritonitis infraumbilically 8/2 Extubated  Antibiotics: Zosyn 8/1 >>> Micafungin 8/3 >> Invanz 7/24- periop Fluconazole- 8/2- one dose Vancomycin 8/8 >>> 05/01/2012  HPI/Subjective: Pt c/o sever upper and lower back pain.  He denies SOB or chest pain.    Objective: Filed Vitals:   05/03/12 1030 05/03/12 1100 05/03/12 1110 05/03/12 1204  BP: 142/80 123/68 117/80 145/73  Pulse: 111 115 120 104  Temp:   97 F (36.1 C) 98.2 F (36.8 C)  TempSrc:   Oral Axillary  Resp: 20 21 19 17   Height:      Weight:    63.8 kg (140 lb 10.5 oz)  SpO2:    100%    Intake/Output Summary (Last 24 hours) at 05/03/12 1252 Last data filed at 05/03/12 1110  Gross per 24 hour  Intake   1500 ml  Output   3730 ml  Net  -2230 ml    Exam:   General:  Alert, cachectic-appearing, in no acute distress  Cardiovascular: Sinus rhythm, S1-S2, peripheral pulses palpable, no peripheral edema  Respiratory: Clear to auscultation throughout without wheeze  Abdomen: Midline dressing in place - bs negative, non-distended  but is tender around the wound  Musculoskeletal: No significant lower extremity edema on R - 2+ edema of LLE to upper thigh  Neurological: Patient is alert and oriented  Data Reviewed: Basic Metabolic Panel:  Lab 05/03/12 1610 05/02/12 0452 05/01/12 1002 05/01/12 0425 04/30/12 0724 04/30/12 0500 04/29/12 0710 04/28/12 0715  NA 128* 132* 129* 129* 132* -- -- --  K 3.3* 3.5 3.6 3.6 3.7 -- -- --  CL 92* 96 93* 94* 97 -- -- --  CO2 20 23 21 22 24  -- -- --  GLUCOSE 132* 154* 120* 107* 150* -- -- --  BUN 76* 45* 60* 54* 35* -- -- --  CREATININE 5.67* 3.61* 4.31* 3.94* 2.79* -- -- --  CALCIUM 9.8 9.3 9.4 9.3 8.9 -- -- --  MG -- -- -- -- -- -- 1.6 --  PHOS -- -- 2.7 -- 2.7 3.3 2.2* 1.0*   Liver Function Tests:  Lab 05/03/12 0623 05/01/12 1002 04/30/12 0724 04/29/12 0710 04/28/12 0715  AST 21 -- -- 24 --  ALT 23 -- -- 14 --  ALKPHOS 119* -- -- 70 --  BILITOT 1.5* -- -- 1.2 --  PROT 5.4* -- -- 4.8* --  ALBUMIN 1.5* 1.5* 1.6* 1.4* 1.5*   CBC:  Lab 05/03/12 0623 05/03/12 0535 05/01/12 1002 04/30/12 1352 04/30/12 0500  WBC 27.7* 26.4* 19.2* 21.5* 22.4*  NEUTROABS -- 23.2* -- -- 19.1*  HGB 10.3* 10.7* 10.1* 10.5* 8.3*  HCT 30.5* 31.5* 28.8* 30.4* 24.2*  MCV 84.0 84.9 84.0 83.3 81.8  PLT 176 169 180 151 163   Cardiac Enzymes:  Lab 04/28/12 1100 04/28/12 0236 04/27/12 2130 04/27/12 1300 04/27/12 0525  CKTOTAL 46 32 22 24 23  CKMB 4.1* 4.8* 5.4* 7.1* 4.7*  CKMBINDEX -- -- -- -- --  TROPONINI 1.91* 2.14* 1.49* 2.32* 1.27*   CBG:  Lab 05/03/12 1209 05/03/12 0451 05/03/12 0029 05/02/12 2032 05/02/12 1546  GLUCAP 174* 156* 166* 179* 161*    Recent Results (from the past 240 hour(s))  CULTURE, BLOOD (ROUTINE X 2)     Status: Normal   Collection Time   04/24/12 11:35 PM      Component Value Range Status Comment   Specimen Description BLOOD LEFT ARM   Final    Special Requests BOTTLES DRAWN AEROBIC ONLY 5CC   Final    Culture  Setup Time 04/25/2012 11:02   Final    Culture NO  GROWTH 5 DAYS   Final    Report Status 05/01/2012 FINAL   Final   CULTURE, BLOOD (ROUTINE X 2)     Status: Normal   Collection Time   04/24/12 11:40 PM      Component Value Range Status Comment   Specimen Description BLOOD LEFT HAND   Final    Special Requests BOTTLES DRAWN AEROBIC ONLY 5CC   Final    Culture  Setup Time 04/25/2012 11:02   Final    Culture NO GROWTH 5 DAYS   Final    Report Status 05/01/2012 FINAL   Final   CULTURE, BLOOD (ROUTINE X 2)     Status: Normal (Preliminary result)   Collection Time   04/28/12  3:38 PM      Component Value Range Status Comment   Specimen Description BLOOD RIGHT HAND   Final    Special Requests BOTTLES DRAWN AEROBIC ONLY 5CC   Final    Culture  Setup Time 04/28/2012 22:13   Final    Culture     Final    Value:        BLOOD CULTURE RECEIVED NO GROWTH TO DATE CULTURE WILL BE HELD FOR 5 DAYS BEFORE ISSUING A FINAL NEGATIVE REPORT   Report Status PENDING   Incomplete   CULTURE, BLOOD (ROUTINE X 2)     Status: Normal (Preliminary result)   Collection Time   04/28/12  3:48 PM      Component Value Range Status Comment   Specimen Description BLOOD RIGHT ARM   Final    Special Requests BOTTLES DRAWN AEROBIC ONLY 5CC   Final    Culture  Setup Time 04/28/2012 22:12   Final    Culture     Final    Value:        BLOOD CULTURE RECEIVED NO GROWTH TO DATE CULTURE WILL BE HELD FOR 5 DAYS BEFORE ISSUING A FINAL NEGATIVE REPORT   Report Status PENDING   Incomplete   ANAEROBIC CULTURE     Status: Normal (Preliminary result)   Collection Time   04/30/12  5:15 PM      Component Value Range Status Comment   Specimen Description ABSCESS ABDOMEN   Final    Special Requests 4 SYRINGES @ 60CC   Final    Gram Stain     Final    Value: ABUNDANT WBC PRESENT, PREDOMINANTLY PMN     NO SQUAMOUS EPITHELIAL CELLS SEEN     ABUNDANT GRAM POSITIVE COCCI IN PAIRS     IN CLUSTERS   Culture     Final    Value: NO ANAEROBES ISOLATED; CULTURE IN PROGRESS FOR 5 DAYS   Report Status  PENDING   Incomplete   FUNGUS CULTURE W SMEAR  Status: Normal (Preliminary result)   Collection Time   04/30/12  5:15 PM      Component Value Range Status Comment   Specimen Description ABSCESS ABDOMEN   Final    Special Requests 4 SYRINGES @ 60CC   Final    Fungal Smear NO YEAST OR FUNGAL ELEMENTS SEEN   Final    Culture CULTURE IN PROGRESS FOR FOUR WEEKS   Final    Report Status PENDING   Incomplete   CULTURE, ROUTINE-ABSCESS     Status: Normal (Preliminary result)   Collection Time   04/30/12  5:15 PM      Component Value Range Status Comment   Specimen Description ABSCESS ABDOMEN   Final    Special Requests 4 SYRINGES @ 60CC   Final    Gram Stain     Final    Value: ABUNDANT WBC PRESENT, PREDOMINANTLY PMN     NO SQUAMOUS EPITHELIAL CELLS SEEN     ABUNDANT GRAM POSITIVE COCCI IN PAIRS     IN CLUSTERS   Culture FEW YEAST CONSISTENT WITH CANDIDA SPECIES   Final    Report Status PENDING   Incomplete     Scheduled Meds: reviewed Continuous Infusions: reviewed _________________________________________________________________  05/03/2012, 12:52 PM  LOS: 31 days    Lonia Blood, MD [facility] Office  [phone] Pager [phone]  On-Call/Text Page:      [email or web address]      password [facility]

## 2012-05-04 ENCOUNTER — Inpatient Hospital Stay (HOSPITAL_COMMUNITY): Payer: Medicare Other

## 2012-05-04 ENCOUNTER — Encounter (HOSPITAL_COMMUNITY): Payer: Self-pay | Admitting: Radiology

## 2012-05-04 DIAGNOSIS — K651 Peritoneal abscess: Secondary | ICD-10-CM

## 2012-05-04 LAB — MAGNESIUM: Magnesium: 1.7 mg/dL (ref 1.5–2.5)

## 2012-05-04 LAB — CBC
HCT: 35.7 % — ABNORMAL LOW (ref 39.0–52.0)
Hemoglobin: 12 g/dL — ABNORMAL LOW (ref 13.0–17.0)
MCH: 28.6 pg (ref 26.0–34.0)
MCHC: 33.6 g/dL (ref 30.0–36.0)
RDW: 20.1 % — ABNORMAL HIGH (ref 11.5–15.5)

## 2012-05-04 LAB — CULTURE, BLOOD (ROUTINE X 2)

## 2012-05-04 LAB — BASIC METABOLIC PANEL
GFR calc non Af Amer: 16 mL/min — ABNORMAL LOW (ref >90)
Glucose, Bld: 141 mg/dL — ABNORMAL HIGH (ref 70–99)
Potassium: 4 mEq/L (ref 3.5–5.1)
Sodium: 134 mEq/L — ABNORMAL LOW (ref 135–145)

## 2012-05-04 LAB — GLUCOSE, CAPILLARY: Glucose-Capillary: 155 mg/dL — ABNORMAL HIGH (ref 70–99)

## 2012-05-04 LAB — PHOSPHORUS: Phosphorus: 2 mg/dL — ABNORMAL LOW (ref 2.3–4.6)

## 2012-05-04 MED ORDER — IOHEXOL 300 MG/ML  SOLN
75.0000 mL | Freq: Once | INTRAMUSCULAR | Status: AC | PRN
  Administered 2012-05-04: 75 mL via INTRAVENOUS

## 2012-05-04 MED ORDER — SODIUM PHOSPHATE 3 MMOLE/ML IV SOLN
10.0000 mmol | Freq: Once | INTRAVENOUS | Status: AC
  Administered 2012-05-04: 10 mmol via INTRAVENOUS
  Filled 2012-05-04: qty 3.33

## 2012-05-04 MED ORDER — MORPHINE SULFATE 2 MG/ML IJ SOLN
INTRAMUSCULAR | Status: AC
  Filled 2012-05-04: qty 1

## 2012-05-04 MED ORDER — IOHEXOL 300 MG/ML  SOLN
20.0000 mL | INTRAMUSCULAR | Status: AC
  Administered 2012-05-04 (×2): 20 mL via ORAL

## 2012-05-04 MED ORDER — INSULIN REGULAR HUMAN 100 UNIT/ML IJ SOLN
INTRAVENOUS | Status: AC
  Administered 2012-05-04: 18:00:00 via INTRAVENOUS
  Filled 2012-05-04: qty 2000

## 2012-05-04 NOTE — Procedures (Signed)
I was present at this dialysis session. I have reviewed the session itself and made appropriate changes.   Vinson Moselle, MD [employer] 05/04/2012, 9:47 AM

## 2012-05-04 NOTE — Progress Notes (Signed)
@   0630 2 HD nurses transported patient via bed to hemodialysis. Patient alert and oriented, in no distress.

## 2012-05-04 NOTE — Progress Notes (Signed)
@   2200 RN spoke with Dr. Janee Morn, ordered to continue monitoring incision and drainage. He will follow up with additional orders, if needed.

## 2012-05-04 NOTE — Progress Notes (Signed)
[facility] PROGRESS NOTE  QUINCY BOY RUE:[id number] DOB: 08-10-1961 DOA: 04/02/2012 PCP: Trevor Iha, MD  Brief narrative: 51 y/o male with ESRD and Prostatectomy in 2012. One day prior to admission he started having abdominal pain which progressively got worse - mainly periumbilical - nothing made it better or worse - had no radiation.  He started vomiting about 3 or 4 times and decided to come to the emergency room.  In the emergency room a CT scan of the abdomen and pelvis was done that showed small bowel obstruction.On 7/24 he underwent lysis of adhesions and partial small bowel resection (iliectomy and partial jejunectomy).Unfortunately on 7//25 he developed a clot in his L femoral AV graft requiring thrombectomy. Small bowel obstruction/ileus symptoms persisted and he subsequently went back to OR on 8/2 for what was found to be a 3cm anastomotic leak with peritonitis. Because of anticipated hemodynamic instability in a malnourished immunocompromised renal patient he was sent to the step down unit for further evaluation and monitoring.  Assessment/Plan:  SBO (small bowel obstruction) s/p lysis of adhesions and partial small bowel resection 7/24 *Subsequent anastomosis dehiscence / SBO s/p lysis of adhesions, small bowel resection, omental patch of anastomosis 8/2  *CT abdomen and pelvis 04/29/2012 demonstrated several fluid collections consistent with intra-abdominal abscess with the largest measuring 16.5 x 13.7 x 6.4 cm.  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 He will need a second procedure tomorrow to either reposition or unclog the current drain or add a second drain- would recommend holding off on Vancomycin until another culture is obtained - He does have gram positives in pairs growing from the drainage fluid but will need to re-confirm  this. Appreciate ID follow up- d/w Dr Daiva Eves today.  *Has open abdom wound possibly connecting with abscess cavity-  is receiving hydrotherapy   Peritonitis secondary to anastomotic dehiscence/intra-abdominal abscess *Cont zosyn and micafungin as per ID *Surgery following wound-see above regarding multiple intra-abdominal postoperative abscess and percutaneous drain insertion *Infectious disease service was consulted on 04/29/2012 *cultures suggesting gram + cocci in pairs  - WBC increasing due to lack of drainage from abscess- see above note.   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 *An attempt was made on 04/21/2012 to place a central line in interventional radiology under fluoroscopy but unfortunately central venous access could not be placed due to upper extremity and internal jugular vein access being chronically occluded bilaterally therefore rationale for why we unfortunately have to continue to use this patient's right femoral Vas-Cath port for parenteral nutrition  Chronic nonocclusive left lower extremity DVT Diagnosed via Doppler 04/30/2012  Ileus following gastrointestinal surgery *Cont TNA via right femoral Vas-Cath port per surgery *As above - anticipated protracted postoperative course  Atypical chest pain/abnormal cardiac enzyme *Had episode of severe chest pain during this hospitalization. CT of the chest revealed no evidence of pulmonary embolism or aortic dissection *Suspect mildly elevated cardiac isoenzymes related to demand ischemia versus recent stressors of recurrent surgical procedures *Multiple EKGs have been performed since admission including during episode of severe pain on Saturday evening and showed no definitive ischemic changes  CKD (chronic kidney disease) stage V requiring chronic dialysis *Dialysis per nephrology  Hypokalemia/hypophosphatemia/hyponatremia *Management per nephrology service/TNA  Thrombocytopenia *Recurrent  this admission x 2 episodes  Normocytic Anemia  *Due to acute blood loss and due to chronic disease *Received packed red blood cells on  04/25/2012 *Cont aranesp and ferric gluconate  HTN (hypertension) Continue to follow without change today - frequent HD is assisting in control   Chronic use of steroids On very slow steroid taper  Malnutrition, calorie *Patient cachectic at presentation  *Now with acute malnutrition related to postoperative ileus and surgical complications *Parenteral nutrition managed by surgery and pharmacy  Leg dialysis graft occlusion *s/p thrombectomy 7/25 - unfortunately re clotted-has undergone an additional thrombectomy procedure with revision of arterial and graft on 04/27/2012 *Currently being dialyzed via groin HD catheter- VVS following  DVT prophylaxis: SCDs Code Status: full  Disposition Plan: Remain in step down  Consultants: General Surgery  Kidney Infectious disease  Events since admission: 7/13 CT Abdomen with SBO  7/24 Ex-lap, lysis of adhesions, partial small bowel resection x2  7/25 Thrombectomy L femoral AV graft  7/26 Extubated, CVL is out  7/27 Transferred to telemetry under [facility]  8/2 Acute abdomen, dehiscence of anastomosis, SBO, back from OR intubated in ICU on PCCM service. Found to have moderate peritonitis infraumbilically 8/2 Extubated  Antibiotics: Zosyn 8/1 >>> Micafungin 8/3 >> Invanz 7/24- periop Fluconazole- 8/2- one dose Vancomycin 8/8 >>> 05/01/2012  HPI/Subjective: Pt c/o sever upper and lower back pain.  He denies SOB or chest pain.    Objective: Filed Vitals:   05/04/12 1030 05/04/12 1046 05/04/12 1051 05/04/12 1612  BP: 148/67 145/62 143/67 138/74  Pulse: 104 105 105 89  Temp:    98 F (36.7 C)  TempSrc:    Axillary  Resp: 16 18 17    Height:      Weight:      SpO2:        Intake/Output Summary (Last 24 hours) at 05/04/12 1757 Last data filed at 05/04/12 0600  Gross per 24 hour  Intake    1070 ml  Output    320 ml  Net    750 ml    Exam:   General:  Alert, cachectic-appearing, in no acute distress  Cardiovascular: Sinus rhythm, S1-S2, peripheral pulses palpable, no peripheral edema  Respiratory: Clear to auscultation throughout without wheeze  Abdomen: Midline dressing in place - bs negative, non-distended but is tender around the wound  Musculoskeletal: No significant lower extremity edema on R - 2+ edema of LLE to upper thigh  Neurological: Patient is alert and oriented  Data Reviewed: Basic Metabolic Panel:  Lab 05/04/12 1610 05/03/12 0623 05/02/12 0452 05/01/12 1002 05/01/12 0425 04/30/12 0724 04/30/12 0500 04/29/12 0710  NA 134* 128* 132* 129* 129* -- -- --  K 4.0 3.3* 3.5 3.6 3.6 -- -- --  CL 97 92* 96 93* 94* -- -- --  CO2 25 20 23 21 22  -- -- --  GLUCOSE 141* 132* 154* 120* 107* -- -- --  BUN 45* 76* 45* 60* 54* -- -- --  CREATININE 4.02* 5.67* 3.61* 4.31* 3.94* -- -- --  CALCIUM 9.9 9.8 9.3 9.4 9.3 -- -- --  MG 1.7 -- -- -- -- -- -- 1.6  PHOS 2.0* -- -- 2.7 -- 2.7 3.3 2.2*   Liver Function Tests:  Lab 05/03/12 0623 05/01/12 1002 04/30/12 0724 04/29/12 0710 04/28/12 0715  AST 21 -- -- 24 --  ALT 23 -- -- 14 --  ALKPHOS 119* -- -- 70 --  BILITOT 1.5* -- -- 1.2 --  PROT 5.4* -- -- 4.8* --  ALBUMIN 1.5* 1.5* 1.6* 1.4* 1.5*   CBC:  Lab 05/04/12 0800 05/03/12 0623 05/03/12 0535 05/01/12 1002 04/30/12 1352 04/30/12 0500  WBC 49.6* 27.7* 26.4* 19.2* 21.5* --  NEUTROABS -- -- 23.2* -- -- 19.1*  HGB 12.0* 10.3* 10.7* 10.1* 10.5* --  HCT 35.7* 30.5* 31.5* 28.8* 30.4* --  MCV 85.2 84.0 84.9 84.0 83.3 --  PLT 181 176 169 180 151 --   Cardiac Enzymes:  Lab 04/28/12 1100 04/28/12 0236 04/27/12 2130  CKTOTAL 46 32 22  CKMB 4.1* 4.8* 5.4*  CKMBINDEX -- -- --  TROPONINI 1.91* 2.14* 1.49*   CBG:  Lab 05/04/12 1615 05/04/12 1250 05/04/12 0931 05/04/12 0422 05/04/12 0024  GLUCAP 121* 151* 119* 140* 155*    Recent Results (from the past 240  hour(s))  CULTURE, BLOOD (ROUTINE X 2)     Status: Normal   Collection Time   04/24/12 11:35 PM      Component Value Range Status Comment   Specimen Description BLOOD LEFT ARM   Final    Special Requests BOTTLES DRAWN AEROBIC ONLY 5CC   Final    Culture  Setup Time 04/25/2012 11:02   Final    Culture NO GROWTH 5 DAYS   Final    Report Status 05/01/2012 FINAL   Final   CULTURE, BLOOD (ROUTINE X 2)     Status: Normal   Collection Time   04/24/12 11:40 PM      Component Value Range Status Comment   Specimen Description BLOOD LEFT HAND   Final    Special Requests BOTTLES DRAWN AEROBIC ONLY 5CC   Final    Culture  Setup Time 04/25/2012 11:02   Final    Culture NO GROWTH 5 DAYS   Final    Report Status 05/01/2012 FINAL   Final   CULTURE, BLOOD (ROUTINE X 2)     Status: Normal   Collection Time   04/28/12  3:38 PM      Component Value Range Status Comment   Specimen Description BLOOD RIGHT HAND   Final    Special Requests BOTTLES DRAWN AEROBIC ONLY 5CC   Final    Culture  Setup Time 04/28/2012 22:13   Final    Culture NO GROWTH 5 DAYS   Final    Report Status 05/04/2012 FINAL   Final   CULTURE, BLOOD (ROUTINE X 2)     Status: Normal   Collection Time   04/28/12  3:48 PM      Component Value Range Status Comment   Specimen Description BLOOD RIGHT ARM   Final    Special Requests BOTTLES DRAWN AEROBIC ONLY 5CC   Final    Culture  Setup Time 04/28/2012 22:12   Final    Culture NO GROWTH 5 DAYS   Final    Report Status 05/04/2012 FINAL   Final   ANAEROBIC CULTURE     Status: Normal (Preliminary result)   Collection Time   04/30/12  5:15 PM      Component Value Range Status Comment   Specimen Description ABSCESS ABDOMEN   Final    Special Requests 4 SYRINGES @ 60CC   Final    Gram Stain     Final    Value: ABUNDANT WBC PRESENT, PREDOMINANTLY PMN     NO SQUAMOUS EPITHELIAL CELLS SEEN     ABUNDANT GRAM POSITIVE COCCI IN PAIRS     IN CLUSTERS   Culture     Final    Value: NO ANAEROBES  ISOLATED; CULTURE IN PROGRESS FOR 5 DAYS   Report Status PENDING   Incomplete   FUNGUS CULTURE W SMEAR  Status: Normal (Preliminary result)   Collection Time   04/30/12  5:15 PM      Component Value Range Status Comment   Specimen Description ABSCESS ABDOMEN   Final    Special Requests 4 SYRINGES @ 60CC   Final    Fungal Smear NO YEAST OR FUNGAL ELEMENTS SEEN   Final    Culture YEAST ISOLATED;ID TO FOLLOW   Final    Report Status PENDING   Incomplete   CULTURE, ROUTINE-ABSCESS     Status: Normal   Collection Time   04/30/12  5:15 PM      Component Value Range Status Comment   Specimen Description ABSCESS ABDOMEN   Final    Special Requests 4 SYRINGES @ 60CC   Final    Gram Stain     Final    Value: ABUNDANT WBC PRESENT, PREDOMINANTLY PMN     NO SQUAMOUS EPITHELIAL CELLS SEEN     ABUNDANT GRAM POSITIVE COCCI IN PAIRS     IN CLUSTERS   Culture FEW YEAST CONSISTENT WITH CANDIDA SPECIES   Final    Report Status 05/04/2012 FINAL   Final     Scheduled Meds: reviewed Continuous Infusions: reviewed _________________________________________________________________  05/04/2012, 5:57 PM  LOS: 32 days    Calvert Cantor, MD [phone]  On-Call/Text Page:      [email or web address]      password [facility]

## 2012-05-04 NOTE — Progress Notes (Signed)
Patient ID: [name], male   DOB: [birth year], 51 y.o.   MRN: [id number] Patient ID: [name], male   DOB: [birth year], 63 y.o.   MRN: [id number] 7 Days Post-Op  Subjective: In dialysis, tired,  noc/o back pain at present. No other c/o offered at this time.  Objective: Vital signs in last 24 hours: Temp:  [97 F (36.1 C)-98.5 F (36.9 C)] 97.8 F (36.6 C) (08/13 0715) Pulse Rate:  [77-142] 118  (08/13 0915) Resp:  [12-21] 18  (08/13 0915) BP: (93-176)/(38-95) 134/58 mmHg (08/13 0915) SpO2:  [96 %-100 %] 99 % (08/13 0715) Weight:  [140 lb 6.9 oz (63.7 kg)-148 lb 13 oz (67.5 kg)] 148 lb 5.9 oz (67.3 kg) (08/13 0715) Last BM Date: 04/30/12  Intake/Output from previous day: 08/12 0701 - 08/13 0700 In: 1070 [IV Piggyback:100; TPN:960] Out: 3720 [Emesis/NG output:300; Drains:20] Intake/Output this shift:    Wound clean, minimal drainage, abdomen soft. No BS. Minimal drainage from perc drain ( 20ml for past 24 hrs recorded) NG remains in place. (300 ml recorded as output for past 24 hrs) No CBC recorded today VSS, afebrile    Lab Results:   [facility] 05/03/12 0623 05/03/12 0535  WBC 27.7* 26.4*  HGB 10.3* 10.7*  HCT 30.5* 31.5*  PLT 176 169   BMET  Basename 05/04/12 0345 05/03/12 0623  NA 134* 128*  K 4.0 3.3*  CL 97 92*  CO2 25 20  GLUCOSE 141* 132*  BUN 45* 76*  CREATININE 4.02* 5.67*  CALCIUM 9.9 9.8   PT/INR No results found for this basename: LABPROT:2,INR:2 in the last 72 hours ABG No results found for this basename: PHART:2,PCO2:2,PO2:2,HCO3:2 in the last 72 hours  Studies/Results: No results found.  Anti-infectives: Anti-infectives     Start     Dose/Rate Route Frequency Ordered Stop   04/30/12 1400   vancomycin (VANCOCIN) 750 mg in sodium chloride 0.9 % 150 mL IVPB  Status:  Discontinued        750 mg 150 mL/hr over 60 Minutes Intravenous  Once 04/30/12 1212 04/30/12 1507   04/29/12 2330   vancomycin (VANCOCIN) 1,500 mg in sodium chloride 0.9  % 500 mL IVPB        1,500 mg 250 mL/hr over 120 Minutes Intravenous  Once 04/29/12 2259 04/30/12 0246   04/27/12 0000   ceFAZolin (ANCEF) IVPB 1 g/50 mL premix  Status:  Discontinued     Comments: Send with pt to OR      1 g 100 mL/hr over 30 Minutes Intravenous On call 04/26/12 0927 04/26/12 0928   04/24/12 1000   micafungin (MYCAMINE) 100 mg in sodium chloride 0.9 % 100 mL IVPB        100 mg 100 mL/hr over 1 Hours Intravenous Daily 04/24/12 0750     04/23/12 1600   fluconazole (DIFLUCAN) IVPB 400 mg        400 mg 200 mL/hr over 60 Minutes Intravenous  Once 04/23/12 1428 04/23/12 1800   04/22/12 2200   piperacillin-tazobactam (ZOSYN) IVPB 3.375 g  Status:  Discontinued        3.375 g 100 mL/hr over 30 Minutes Intravenous 3 times per day 04/22/12 2120 04/22/12 2137   04/22/12 2200   piperacillin-tazobactam (ZOSYN) IVPB 2.25 g        2.25 g 100 mL/hr over 30 Minutes Intravenous 3 times per day 04/22/12 2140     04/22/12 2157   gentamicin (GARAMYCIN) injection  Status:  Discontinued  As needed 04/22/12 2158 04/22/12 2335   04/22/12 2156   clindamycin (CLEOCIN) injection  Status:  Discontinued          As needed 04/22/12 2157 04/22/12 2335   04/22/12 2145   piperacillin-tazobactam (ZOSYN) IVPB 3.375 g        3.375 g 12.5 mL/hr over 240 Minutes Intravenous  Once 04/22/12 2141 04/23/12 0145   04/22/12 2130   gentamicin (GARAMYCIN) 240 mg, clindamycin (CLEOCIN) 900 mg in sodium chloride irrigation 0.9 % 1,000 mL irrigation  Status:  Discontinued         Irrigation  Once 04/22/12 2119 04/23/12 0020   04/14/12 0830   ertapenem (INVANZ) 1 g in sodium chloride 0.9 % 50 mL IVPB        1 g 100 mL/hr over 30 Minutes Intravenous To Surgery 04/14/12 0816 04/14/12 0834   04/13/12 1359   ertapenem (INVANZ) 1 g in sodium chloride 0.9 % 50 mL IVPB  Status:  Discontinued        1 g 100 mL/hr over 30 Minutes Intravenous 60 min pre-op 04/13/12 1359 04/14/12 0816           Assessment/Plan: s/p Procedure(s) (LRB): THROMBECTOMY ARTERIOVENOUS GORE-TEX GRAFT (Left) REVISION OF ARTERIOVENOUS GORETEX GRAFT (Left)  Continue abx, drain Recommend ID consult for increasing WBC, patient has been on numerous ABX.  Medicines note is seen and appreciated Await return of bowel function. Will obtain CT of Abd and pelvis with contrast today to r/o abscess formation or leak as source of WBC trending upward. Repeat CBC in am.    LOS: 32 days    [name] 05/04/2012

## 2012-05-04 NOTE — Progress Notes (Signed)
.  NGT output seems to be trending down [facility] Progress Note  Subjective:  On dialysis, no new complaints. Has already had bump in HR to 140's and hypotension with only 1 kg off.  Objective Filed Vitals:   05/04/12 0848 05/04/12 0901 05/04/12 0906 05/04/12 0915  BP:  93/38 144/66 134/58  Pulse: 112 142 121 118  Temp:      TempSrc:      Resp: 18 18 16 18   Height:      Weight:      SpO2:       Physical Exam General: comfortable, NG in place Heart: RRR Lungs: mostly clear Abdomen: distended, slightly painful.  +bowel sounds. Drain in RLQ Extremities: L leg 2+ asymmetric edema throughout leg, lower>upper. No edema RLE Dialysis Access: left thigh AVGG patent  Dialysis Orders: Center: [facility] on TTS.  EDW 52.5 kg HD Bath 2K/2Ca Time 3.5 hrs Heparin 5000 U. Access AVG @ left thigh BFR 400 DFR 800 Zemplar 0 mcg IV/HD Epogen 0 Units IV/HD Venofer 50/week; last iPTH 314, but product high and no zemplar   Assessment/Plan:  1. SBO, s/p exp lap/SB resection 7/24 Dr. Luisa Hart, complicated by dehiscence with peritonitis, back to OR 8/1 for further SB resection and reanastomosis; Now s/p perc drainage of intraabd abcesses on 8/8. On IV zosyn and mycofungin.   Still with ileus/NGT per surgery. Also needing hydrotherapy to open wound.  2. ESRD - HD normally  TTS at [facility]. temp R groin cath still in place, they are using for IV access; thigh graft s/p declot is functioning ok.  Weights must be wrong; BP drops and marked tachycardia today with attempts to remove volume.  Will stop daily HD and have bed recalibrated after HD.  3. Hypertension/volume - was on no BP meds at home. BP has been variable here.  Conidine stopped and hemodynamics seem improved.  Keep IV metoprolol given sinus tach.   Steroids being weaned. Daily HD should keep in check.  Is hyponatremic due to volume overload, HD will help.   4. Anemia of CKD - Getting max Aranesp. Hgb 10's; on full course of IV Fe; ferritin 552. PRN  transfusions, received 2 units last wk on 8/8 and 8/9. 5. Metabolic bone disease - No binders due to low phos being repleted.  Phos now within normal limits 6. 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  Asymmetric left leg edema may be related to this and to presence of L thigh access.  7. Hypercalcemia- adjusted Ca over 11, will decrease Ca++ in bath. Due to immobility.  8. Protein calorie malnutrition- on TNA 70cc/hr, getting lipids MWF due to shortage 9. S/p renal transplant - at [facility] in '87, started HD in 04/2003, remains on Prednisone. 10. Hepatitis C/thrombocytopenia - stable  Vinson Moselle  MD [facility] [phone] pgr    [phone] cell 05/04/2012, 9:38 AM      Additional Objective Labs: Basic Metabolic Panel:  Lab 05/04/12 4782 05/03/12 0623 05/02/12 0452 05/01/12 1002 04/30/12 0724  NA 134* 128* 132* -- --  K 4.0 3.3* 3.5 -- --  CL 97 92* 96 -- --  CO2 25 20 23  -- --  GLUCOSE 141* 132* 154* -- --  BUN 45* 76* 45* -- --  CREATININE 4.02* 5.67* 3.61* -- --  CALCIUM 9.9 9.8 9.3 -- --  ALB -- -- -- -- --  PHOS 2.0* -- -- 2.7 2.7   Liver Function  Tests:  Lab 05/03/12 0623 05/01/12 1002 04/30/12 0724 04/29/12 0710  AST 21 -- -- 24  ALT 23 -- -- 14  ALKPHOS 119* -- -- 70  BILITOT 1.5* -- -- 1.2  PROT 5.4* -- -- 4.8*  ALBUMIN 1.5* 1.5* 1.6* --   CBC:  Lab 05/03/12 0623 05/03/12 0535 05/01/12 1002 04/30/12 1352 04/30/12 0500  WBC 27.7* 26.4* 19.2* -- --  NEUTROABS -- 23.2* -- -- 19.1*  HGB 10.3* 10.7* 10.1* -- --  HCT 30.5* 31.5* 28.8* -- --  MCV 84.0 84.9 84.0 83.3 81.8  PLT 176 169 180 -- --  Blood Culture    Component Value Date/Time   SDES ABSCESS ABDOMEN 04/30/2012 1715   SDES ABSCESS ABDOMEN 04/30/2012 1715   SDES ABSCESS ABDOMEN 04/30/2012 1715   SPECREQUEST 4 SYRINGES @ 60CC 04/30/2012 1715   SPECREQUEST 4 SYRINGES @ 60CC 04/30/2012 1715   SPECREQUEST 4 SYRINGES @ 60CC 04/30/2012 1715   CULT NO ANAEROBES ISOLATED;  CULTURE IN PROGRESS FOR 5 DAYS 04/30/2012 1715   CULT CULTURE IN PROGRESS FOR FOUR WEEKS 04/30/2012 1715   CULT FEW YEAST CONSISTENT WITH CANDIDA SPECIES 04/30/2012 1715   REPTSTATUS PENDING 04/30/2012 1715   REPTSTATUS PENDING 04/30/2012 1715   REPTSTATUS PENDING 04/30/2012 1715  Cardiac Enzymes:  Lab 04/28/12 1100 04/28/12 0236 04/27/12 2130 04/27/12 1300  CKTOTAL 46 32 22 24  CKMB 4.1* 4.8* 5.4* 7.1*  CKMBINDEX -- -- -- --  TROPONINI 1.91* 2.14* 1.49* 2.32*  CBG:  Lab 05/04/12 0931 05/04/12 0422 05/04/12 0024 05/03/12 2042 05/03/12 1607  GLUCAP 119* 140* 155* 165* 110*  Medications:    . fat emulsion 240 mL (05/03/12 1717)  . TPN (CLINIMIX) +/- additives 70 mL/hr at 05/02/12 1716  . TPN (CLINIMIX) +/- additives 70 mL/hr at 05/03/12 1717  . TPN (CLINIMIX) +/- additives        . antiseptic oral rinse  15 mL Mouth Rinse q12n4p  . chlorhexidine  15 mL Mouth Rinse BID  . darbepoetin (ARANESP) injection - DIALYSIS  200 mcg Intravenous Q Sat-HD  . fentaNYL  37.5 mcg Transdermal Q72H  . ferric gluconate (FERRLECIT/NULECIT) IV  125 mg Intravenous Q T,Th,Sa-HD  . hydrocortisone sod succinate (SOLU-CORTEF) injection  25 mg Intravenous Q12H  . insulin aspart  0-15 Units Subcutaneous Q4H  . iohexol  20 mL Oral Q1 Hr x 2  . lip balm   Topical BID  . metoprolol  5 mg Intravenous Q6H  . micafungin (MYCAMINE) IV  100 mg Intravenous Daily  . morphine      . pantoprazole (PROTONIX) IV  40 mg Intravenous Q24H  . piperacillin-tazobactam (ZOSYN)  IV  2.25 g Intravenous Q8H  . potassium chloride  10 mEq Intravenous Q1 Hr x 3  . sodium chloride  10-40 mL Intracatheter Q12H  . sodium phosphate  Dextrose 5% IVPB  10 mmol Intravenous Once  . DISCONTD: fentaNYL  25 mcg Transdermal Q72H

## 2012-05-04 NOTE — Progress Notes (Signed)
[facility]    Date of Admission:  04/02/2012     Total days of antibiotics 13  Zosyn Day 13  Micafungin Day 11    Principal Problem:  *SBO (small bowel obstruction) s/p EL/LOA and SBR x 2 Active Problems:  CKD (chronic kidney disease) stage V requiring chronic dialysis  Erythrocytosis  Thrombocytopenia  Gout  HTN (hypertension)  Ileus following gastrointestinal surgery  Chronic use of steroids  Malnutrition, calorie  Leg graft occlusion  Acute respiratory failure with hypoxia  Sinus tachycardia      . antiseptic oral rinse  15 mL Mouth Rinse q12n4p  . chlorhexidine  15 mL Mouth Rinse BID  . darbepoetin (ARANESP) injection - DIALYSIS  200 mcg Intravenous Q Sat-HD  . fentaNYL  37.5 mcg Transdermal Q72H  . ferric gluconate (FERRLECIT/NULECIT) IV  125 mg Intravenous Q T,Th,Sa-HD  . hydrocortisone sod succinate (SOLU-CORTEF) injection  25 mg Intravenous Q12H  . insulin aspart  0-15 Units Subcutaneous Q4H  . iohexol  20 mL Oral Q1 Hr x 2  . lip balm   Topical BID  . metoprolol  5 mg Intravenous Q6H  . micafungin (MYCAMINE) IV  100 mg Intravenous Daily  . morphine      . pantoprazole (PROTONIX) IV  40 mg Intravenous Q24H  . piperacillin-tazobactam (ZOSYN)  IV  2.25 g Intravenous Q8H  . potassium chloride  10 mEq Intravenous Q1 Hr x 3  . sodium chloride  10-40 mL Intracatheter Q12H  . sodium phosphate  Dextrose 5% IVPB  10 mmol Intravenous Once  . DISCONTD: fentaNYL  25 mcg Transdermal Q72H    Subjective: Pt returned from HD feeling weak and tired. Pt was communicating through hand gestures and indicated abdominal pain. No overnight events.  Objective: Temp:  [97 F (36.1 C)-98.5 F (36.9 C)] 97.8 F (36.6 C) (08/13 0715) Pulse Rate:  [77-142] 118  (08/13 0915) Resp:  [12-21] 18  (08/13 0915) BP: (93-176)/(38-95) 134/58 mmHg (08/13 0915) SpO2:  [96 %-100 %] 99 % (08/13 0715) Weight:  [63.7 kg (140 lb 6.9 oz)-67.5 kg (148 lb 13 oz)]  67.3 kg (148 lb 5.9 oz) (08/13 0715)  General: oriented x 3, NAD  HEENT: PERRL, EOMI, scleral icterus  Cardiac: RRR, no rubs, murmurs or gallops  Pulm: clear to auscultation bilaterally, moving normal volumes of air  Abd: soft, tender, nondistended, BS present  Ext: warm and well perfused, no pedal edema, dry skin  Neuro: alert and oriented X3, cranial nerves II-XII grossly intact  Lab Results Lab Results  Component Value Date   WBC 27.7* 05/03/2012   HGB 10.3* 05/03/2012   HCT 30.5* 05/03/2012   MCV 84.0 05/03/2012   PLT 176 05/03/2012    Lab Results  Component Value Date   CREATININE 4.02* 05/04/2012   BUN 45* 05/04/2012   NA 134* 05/04/2012   K 4.0 05/04/2012   CL 97 05/04/2012   CO2 25 05/04/2012    Lab Results  Component Value Date   ALT 23 05/03/2012   AST 21 05/03/2012   ALKPHOS 119* 05/03/2012   BILITOT 1.5* 05/03/2012      Microbiology: Recent Results (from the past 240 hour(s))  CULTURE, BLOOD (ROUTINE X 2)     Status: Normal   Collection Time   04/24/12 11:35 PM      Component Value Range Status Comment   Specimen Description BLOOD LEFT ARM   Final    Special Requests BOTTLES DRAWN AEROBIC ONLY  5CC   Final    Culture  Setup Time 04/25/2012 11:02   Final    Culture NO GROWTH 5 DAYS   Final    Report Status 05/01/2012 FINAL   Final   CULTURE, BLOOD (ROUTINE X 2)     Status: Normal   Collection Time   04/24/12 11:40 PM      Component Value Range Status Comment   Specimen Description BLOOD LEFT HAND   Final    Special Requests BOTTLES DRAWN AEROBIC ONLY 5CC   Final    Culture  Setup Time 04/25/2012 11:02   Final    Culture NO GROWTH 5 DAYS   Final    Report Status 05/01/2012 FINAL   Final   CULTURE, BLOOD (ROUTINE X 2)     Status: Normal   Collection Time   04/28/12  3:38 PM      Component Value Range Status Comment   Specimen Description BLOOD RIGHT HAND   Final    Special Requests BOTTLES DRAWN AEROBIC ONLY 5CC   Final    Culture  Setup Time 04/28/2012 22:13   Final     Culture NO GROWTH 5 DAYS   Final    Report Status 05/04/2012 FINAL   Final   CULTURE, BLOOD (ROUTINE X 2)     Status: Normal   Collection Time   04/28/12  3:48 PM      Component Value Range Status Comment   Specimen Description BLOOD RIGHT ARM   Final    Special Requests BOTTLES DRAWN AEROBIC ONLY 5CC   Final    Culture  Setup Time 04/28/2012 22:12   Final    Culture NO GROWTH 5 DAYS   Final    Report Status 05/04/2012 FINAL   Final   ANAEROBIC CULTURE     Status: Normal (Preliminary result)   Collection Time   04/30/12  5:15 PM      Component Value Range Status Comment   Specimen Description ABSCESS ABDOMEN   Final    Special Requests 4 SYRINGES @ 60CC   Final    Gram Stain     Final    Value: ABUNDANT WBC PRESENT, PREDOMINANTLY PMN     NO SQUAMOUS EPITHELIAL CELLS SEEN     ABUNDANT GRAM POSITIVE COCCI IN PAIRS     IN CLUSTERS   Culture     Final    Value: NO ANAEROBES ISOLATED; CULTURE IN PROGRESS FOR 5 DAYS   Report Status PENDING   Incomplete   FUNGUS CULTURE W SMEAR     Status: Normal (Preliminary result)   Collection Time   04/30/12  5:15 PM      Component Value Range Status Comment   Specimen Description ABSCESS ABDOMEN   Final    Special Requests 4 SYRINGES @ 60CC   Final    Fungal Smear NO YEAST OR FUNGAL ELEMENTS SEEN   Final    Culture CULTURE IN PROGRESS FOR FOUR WEEKS   Final    Report Status PENDING   Incomplete   CULTURE, ROUTINE-ABSCESS     Status: Normal (Preliminary result)   Collection Time   04/30/12  5:15 PM      Component Value Range Status Comment   Specimen Description ABSCESS ABDOMEN   Final    Special Requests 4 SYRINGES @ 60CC   Final    Gram Stain     Final    Value: ABUNDANT WBC PRESENT, PREDOMINANTLY PMN     NO SQUAMOUS EPITHELIAL  CELLS SEEN     ABUNDANT GRAM POSITIVE COCCI IN PAIRS     IN CLUSTERS   Culture FEW YEAST CONSISTENT WITH CANDIDA SPECIES   Final    Report Status PENDING   Incomplete     Studies/Results: No results  found.  Assessment:  Pt underwent HD today. Pt getting HD daily because of TNA. There may be an association between the administration of TNA with the very high WBC count seen today.  Pt getting abdominal CT due to abdominal pain. Abscess drainage showed GPC in pairs in clusters - S. Aureus. Coagulase negative or positive bacteria has yet to be determined.  Plan:  1. Peritonitis/Abscess  Abscess stain show GPC in pairs in clusters, plus cultures grew few yeast consistent with Candida. Cultures have not grown GPC to this date.   - Pt had a dose of vancomycin given early morning on 04/30/12 therefore culture may be negative. Will keep medications as is for now and will reassess tomorrow. Elevated WBC count could be 2/2 chronic steroid use. Will consider to Add Vanc at a later date. As GFR is 12, dose at 15 mg/kg X 1, then usual dose. Pharm to dose once vancomycin is added.   - repeat CBC with HD as 8/13 WBC count is 49.6. Can be 2/2 to C. Diff  - f/u on CBC and BMP and cultures - continue Zosyn, Micafungin   [name] [facility] 05/04/2012, 9:39 AM  This is a Psychologist, occupational Note. The care of the patient was discussed with Dr. Daiva Eves and the assessment and plan was formulated with their assistance. Please see their note for official documentation of the patient encounter.   INFECTIOUS DISEASE ATTENDING ADDENDUM:     [facility]   Date: 05/04/2012  Patient name: [name]  Medical record number: [id number]  Date of birth: [birth year]    This patient has been seen and discussed with the house staff. Please see their note for complete details. I concur with their findings with the following additions/corrections:  Patient with worsening leukocytosis in patient with intra-abdominal abscessses after multiple abdominal surgery on broad spectrum antibiotics. He is hemodynamically stable. He has no diarrhea  at all and has had no bowel movement for 2 weeks making C. difficile colitis less likely. 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   I would recommend placement of additional drains per interventional radiology so that cultures for bacterial pathogens aerobic anaerobic and fungal pathogens can be obtained from the abscess. Once these  And properly drained we cab consider antibiotic therapy based on culture data. I WOULD NOT BROADEN FURTHER BEYOND THE ZOSYN AND MICAFUNGIN AT THIS POINT.  Will defer to [facility] whether or not they will plan on return to OR again to address the fistula.  Paulette Blanch Dam 05/04/2012, 6:00 PM

## 2012-05-04 NOTE — Progress Notes (Signed)
@   2150 patient mid lower abdominal dressing saturated with bile colored secretion. Patient denies having pain. RN verified patient's NG tube in place, patent, and to LIWS. Upon changing dressing per current written order, small amount of bile colored secretion noted draining from lower abd incision. Kirtland Bouchard Schorr and Dr. Janee Morn.

## 2012-05-04 NOTE — Progress Notes (Signed)
Patient ID: [name], male   DOB: [birth year], 51 y.o.   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  Current drain in debris dependently in abscess.  Will need either second drain more anteriorly in collection or upsizing/repositioning of drain in CT.  Will discuss w/ Dr. Lowella Dandy who is on IR service tomorrow.

## 2012-05-04 NOTE — Progress Notes (Signed)
PARENTERAL NUTRITION CONSULT NOTE - FOLLOW UP  Pharmacy Consult for TPN Indication: SBO, s/p LOA/SBR/anastamosis , s/p LOA/SBR/re-anastamosis with patch repair for leak  Allergies  Allergen Reactions  . Allopurinol     REACTION: decreased platelets  . Aspirin     REACTION: unspecified    Patient Measurements: Height: 5\' 11"  (180.3 cm) Weight: 148 lb 13 oz (67.5 kg) IBW/kg (Calculated) : 75.3   Vital Signs: Temp: 98.3 F (36.8 C) (08/13 0420) Temp src: Oral (08/13 0420) BP: 149/74 mmHg (08/13 0500) Pulse Rate: 81  (08/13 0500) Intake/Output from previous day: 08/12 0701 - 08/13 0700 In: 1070 [IV Piggyback:100; TPN:960] Out: 3720 [Emesis/NG output:300; Drains:20] Intake/Output from this shift:    Labs:  [facility] 05/03/12 0623 05/03/12 0535 05/01/12 1002  WBC 27.7* 26.4* 19.2*  HGB 10.3* 10.7* 10.1*  HCT 30.5* 31.5* 28.8*  PLT 176 169 180  APTT -- -- --  INR -- -- --     Basename 05/04/12 0345 05/03/12 0623 05/03/12 0535 05/02/12 0452 05/01/12 1002  NA 134* 128* -- 132* --  K 4.0 3.3* -- 3.5 --  CL 97 92* -- 96 --  CO2 25 20 -- 23 --  GLUCOSE 141* 132* -- 154* --  BUN 45* 76* -- 45* --  CREATININE 4.02* 5.67* -- 3.61* --  LABCREA -- -- -- -- --  CREAT24HRUR -- -- -- -- --  CALCIUM 9.9 9.8 -- 9.3 --  MG 1.7 -- -- -- --  PHOS 2.0* -- -- -- 2.7  PROT -- 5.4* -- -- --  ALBUMIN -- 1.5* -- -- 1.5*  AST -- 21 -- -- --  ALT -- 23 -- -- --  ALKPHOS -- 119* -- -- --  BILITOT -- 1.5* -- -- --  BILIDIR -- -- -- -- --  IBILI -- -- -- -- --  PREALBUMIN -- -- 25.1 -- --  TRIG -- -- 71 -- --  CHOLHDL -- -- -- -- --  CHOL -- -- 121 -- --   Estimated Creatinine Clearance: 20.5 ml/min (by C-G formula based on Cr of 4.02).    Basename 05/04/12 0422 05/04/12 0024 05/03/12 2042  GLUCAP 140* 155* 165*    Insulin Requirements in the past 24 hours:  8 units Novolog. Currently on Moderate SSI q4h with 15 units insulin in TNA  Current Nutrition:  NPO, Clinimix 5/15  at 70 ml/hr with lipids at 10 ml/hr MWF only  Admit:  Admitted 7/13 with abd pain, weight loss, SBO. S/p LOA, SBR 724 and repeat LOA, SBR with anasomotic leak repair 8/1 , thrombectomy with AVG revision 7/25 and repeat thrombectomy 8/6.  GI: Has been essentially NPO since admit, therefore at significant risk for re-feeding, but has been tolerating TPN thus far. NGT output slightly incr to 300/24h yesterday. Wound drainage output decr. Endo: Moderate SSI q4, on Solu-Cortef-25mg  bid. CBG control is fair, goal < 150. Lytes:  Na low, likely d/t overload (noted improvement with HD) and Clinimix without supplemented lytes. Corrected Ca =11.8,slightly high despite no lytes in TNA. Ca x PO4 product <50. K WNL. Noted phos low today Renal:  ESRD w/HD typically T/TH/Sat - currently receiving daily HD d/t fluid overload with ongoing TPN. On Ferrlecit, Aranesp-Sat Pulm: extubated; now on RA Cards: hypertensive. HR 66-100s- on scheduled lopressor and prn hydralazine. Suspected elev BP d/t hypervolemia Hepatobil: LFTs, trig and chol WNL.  Alkphos and tbil are elevated. Albumin is low. Prealbumin is at goal. Neuro: GCS 15,  Pain 0-5/10- on prn  pain meds. ID: Afeb, WBC cont to incr, abd abscess cx ngtd- on Mycamine and renally adjusted Zosyn Best Practices:  Mouthcare, PPI-IV  Nutritional Goals:  1700-1950 kCal, 75-85 grams of protein per day  Plan:  - Continue Clinimix 5/15 at 70 ml/hr, which meets protein goal but is just below kcal. - Will increase insulin to 20 units/bag in TPN tonight - Provide available trace elements, MVI and lipids @10cc /hr on MWF only d/t national backorder. - Will give of NaPhos today - F/u Phos and BMET in AM  Narda Fundora K. Allena Katz, PharmD, BCPS.  Clinical Pharmacist Pager [phone]. 05/04/2012 8:18 AM

## 2012-05-04 NOTE — Progress Notes (Signed)
Hydrotherapy Note:   05/04/12 1350  Subjective Assessment  Subjective 05/04/12  Patient and Family Stated Goals Get well.  Date of Onset 04/02/12  Prior Treatments Dressing changes  Evaluation and Treatment  Evaluation and Treatment Procedures Explained to Patient/Family Yes  Evaluation and Treatment Procedures agreed to  Wound 04/27/12 Other (Comment) Abdomen Mid Midline abdominal incisional wound  Date First Assessed/Time First Assessed: 04/27/12 1326   Wound Type: Other (Comment)  Location: Abdomen  Location Orientation: Mid  Wound Description (Comments): Midline abdominal incisional wound  Site / Wound Assessment Yellow;Red;Black  % Wound base Red or Granulating 60%  % Wound base Yellow 35%  % Wound base Black 5%  Peri-wound Assessment Intact  Wound Length (cm) 16.2 cm  Wound Width (cm) 2.8 cm  Wound Depth (cm) 1 cm  Undermining (cm) 1.6 cm to 0 cm from 3 to 6 o'clock (decreasing gradually).  Margins Unattacted edges (unapproximated)  Closure Sutures  Drainage Amount Minimal  Drainage Description Serosanguineous  Non-staged Wound Description Full thickness  Treatment Hydrotherapy (Pulse lavage);Debridement (Selective);Packing (Saline gauze) (NS W to D 4x4, dry 4x4, abd, skin prep, tape)  Dressing Type Moist to dry (NS W to D 4x4, dry 4x4, abd, skin prep, tape)  Dressing Changed New  Dressing Status Clean  Hydrotherapy  Pulsed Lavage with Suction (psi) 8 psi (4-8 psi)  Pulsed Lavage with Suction - Normal Saline Used 1000 mL  Pulsed Lavage Tip Tip with splash shield  Pulsed lavage therapy - wound location Abdomen  Selective Debridement  Selective Debridement - Location Abdomen  Selective Debridement - Tools Used Forceps;Scissors  Selective Debridement - Tissue Removed Yellow and black slough and dead fat tissue.  Wound Therapy - Assess/Plan/Recommendations  Wound Therapy - Clinical Statement Pt is a 51 y/o male with an abdominal midline incisional wound.  Pt continues to  benefit from hydrotherapy by decreasing necrotic tissue/increasing granulation tissue.  Spoke with surgery NP (who was in to view wound), and hopeful for [facility] placement to wound by the end of this week.  Will re-evaluate with NP on Thursday, 05/06/12.    Wound Therapy - Functional Problem List Decreased skin integrity causing increased chance for infection.  Factors Delaying/Impairing Wound Healing Infection - systemic/local;Immobility;Multiple medical problems;Polypharmacy  Hydrotherapy Plan Debridement;Dressing change;Patient/family education;Pulsatile lavage with suction  Wound Therapy - Frequency 6X / week  Wound Therapy - Current Recommendations PT;OT;Surgery consult;WOC nurse  Wound Therapy - Follow Up Recommendations Home health RN  Wound Plan Pulse lavage, debridement, dressing change 6x/week.  Wound Therapy Goals - Improve the function of patient's integumentary system by progressing the wound(s) through the phases of wound healing by:  Decrease Necrotic Tissue to 25  Decrease Necrotic Tissue - Progress Updated due to goal met  Increase Granulation Tissue to 75  Increase Granulation Tissue - Progress Updated due to goal met  Improve Drainage Characteristics Serous  Improve Drainage Characteristics - Progress Goal set today  Goals/treatment plan/discharge plan were made with and agreed upon by patient/family Yes  Time For Goal Achievement 7 days  Wound Therapy - Potential for Goals Good    Pain:  0 on faces scale.  05/04/2012 Cephus Shelling, PT, DPT [phone]

## 2012-05-05 ENCOUNTER — Inpatient Hospital Stay (HOSPITAL_COMMUNITY): Payer: Medicare Other

## 2012-05-05 LAB — CBC
HCT: 34.6 % — ABNORMAL LOW (ref 39.0–52.0)
MCH: 29 pg (ref 26.0–34.0)
MCV: 88.9 fL (ref 78.0–100.0)
RDW: 21.6 % — ABNORMAL HIGH (ref 11.5–15.5)
WBC: 26.4 10*3/uL — ABNORMAL HIGH (ref 4.0–10.5)

## 2012-05-05 LAB — GLUCOSE, CAPILLARY
Glucose-Capillary: 168 mg/dL — ABNORMAL HIGH (ref 70–99)
Glucose-Capillary: 132 mg/dL — ABNORMAL HIGH (ref 70–99)
Glucose-Capillary: 147 mg/dL — ABNORMAL HIGH (ref 70–99)
Glucose-Capillary: 137 mg/dL — ABNORMAL HIGH (ref 70–99)

## 2012-05-05 LAB — BASIC METABOLIC PANEL
BUN: 45 mg/dL — ABNORMAL HIGH (ref 6–23)
CO2: 23 mEq/L (ref 19–32)
Calcium: 9.8 mg/dL (ref 8.4–10.5)
Glucose, Bld: 120 mg/dL — ABNORMAL HIGH (ref 70–99)
Sodium: 132 mEq/L — ABNORMAL LOW (ref 135–145)

## 2012-05-05 LAB — ANAEROBIC CULTURE

## 2012-05-05 MED ORDER — ZINC TRACE METAL 1 MG/ML IV SOLN
INTRAVENOUS | Status: AC
  Administered 2012-05-05: 18:00:00 via INTRAVENOUS
  Filled 2012-05-05: qty 2000

## 2012-05-05 MED ORDER — MIDAZOLAM HCL 5 MG/5ML IJ SOLN
INTRAMUSCULAR | Status: AC | PRN
  Administered 2012-05-05: 2 mg via INTRAVENOUS

## 2012-05-05 MED ORDER — FAT EMULSION 20 % IV EMUL
240.0000 mL | INTRAVENOUS | Status: AC
  Administered 2012-05-05: 240 mL via INTRAVENOUS
  Filled 2012-05-05: qty 250

## 2012-05-05 MED ORDER — FENTANYL CITRATE 0.05 MG/ML IJ SOLN
INTRAMUSCULAR | Status: AC
  Filled 2012-05-05: qty 4

## 2012-05-05 MED ORDER — NITROGLYCERIN 0.4 MG SL SUBL: 0.4000 mg | SUBLINGUAL_TABLET | SUBLINGUAL | Status: DC | PRN

## 2012-05-05 MED ORDER — FENTANYL CITRATE 0.05 MG/ML IJ SOLN
INTRAMUSCULAR | Status: AC | PRN
  Administered 2012-05-05: 25 ug via INTRAVENOUS

## 2012-05-05 MED ORDER — METOPROLOL TARTRATE 1 MG/ML IV SOLN
5.0000 mg | Freq: Once | INTRAVENOUS | Status: AC
  Administered 2012-05-05: 5 mg via INTRAVENOUS

## 2012-05-05 MED ORDER — IOHEXOL 300 MG/ML  SOLN
50.0000 mL | Freq: Once | INTRAMUSCULAR | Status: AC | PRN
  Administered 2012-05-05: 10 mL

## 2012-05-05 MED ORDER — HEPARIN SODIUM (PORCINE) 1000 UNIT/ML DIALYSIS
2500.0000 [IU] | INTRAMUSCULAR | Status: DC | PRN
Start: 2012-05-06 — End: 2012-05-10
  Administered 2012-05-06: 1500 [IU] via INTRAVENOUS_CENTRAL
  Filled 2012-05-05: qty 3

## 2012-05-05 MED ORDER — NITROGLYCERIN 0.4 MG SL SUBL
SUBLINGUAL_TABLET | SUBLINGUAL | Status: AC
  Administered 2012-05-05: 22:00:00
  Filled 2012-05-05: qty 25

## 2012-05-05 MED ORDER — MIDAZOLAM HCL 2 MG/2ML IJ SOLN
INTRAMUSCULAR | Status: AC
  Filled 2012-05-05: qty 4

## 2012-05-05 NOTE — Progress Notes (Signed)
[facility]    Date of Admission:  04/02/2012     Total days of antibiotics 14  Zosyn Day 14  Micafungin Day 12   Principal Problem:  *SBO (small bowel obstruction) s/p EL/LOA and SBR x 2 Active Problems:  CKD (chronic kidney disease) stage V requiring chronic dialysis  Erythrocytosis  Thrombocytopenia  Gout  HTN (hypertension)  Ileus following gastrointestinal surgery  Chronic use of steroids  Malnutrition, calorie  Leg graft occlusion  Acute respiratory failure with hypoxia  Sinus tachycardia  Abscess of abdominal cavity      . antiseptic oral rinse  15 mL Mouth Rinse q12n4p  . chlorhexidine  15 mL Mouth Rinse BID  . darbepoetin (ARANESP) injection - DIALYSIS  200 mcg Intravenous Q Sat-HD  . fentaNYL  37.5 mcg Transdermal Q72H  . ferric gluconate (FERRLECIT/NULECIT) IV  125 mg Intravenous Q T,Th,Sa-HD  . hydrocortisone sod succinate (SOLU-CORTEF) injection  25 mg Intravenous Q12H  . insulin aspart  0-15 Units Subcutaneous Q4H  . iohexol  20 mL Oral Q1 Hr x 2  . lip balm   Topical BID  . metoprolol  5 mg Intravenous Q6H  . micafungin (MYCAMINE) IV  100 mg Intravenous Daily  . morphine      . pantoprazole (PROTONIX) IV  40 mg Intravenous Q24H  . piperacillin-tazobactam (ZOSYN)  IV  2.25 g Intravenous Q8H  . sodium chloride  10-40 mL Intracatheter Q12H  . sodium phosphate  Dextrose 5% IVPB  10 mmol Intravenous Once    Subjective: Pt was resting in bed with no current complaints. Pt understands he is going for a procedure today. No overnight events. Denies headache, chest pain, shortness of breath, N/V.  Objective: Temp:  [97.8 F (36.6 C)-98.6 F (37 C)] 97.8 F (36.6 C) (08/14 0800) Pulse Rate:  [79-110] 82  (08/14 0900) Resp:  [13-19] 15  (08/14 0900) BP: (133-163)/(62-80) 162/77 mmHg (08/14 0900) SpO2:  [99 %-100 %] 100 % (08/14 0900) Weight:  [64.8 kg (142 lb 13.7 oz)] 64.8 kg (142 lb 13.7 oz) (08/14 0000)  General: oriented  x 3, NAD, lethargic  HEENT: PERRL, EOMI, scleral icterus  Cardiac: RRR, no rubs, murmurs or gallops  Pulm: clear to auscultation bilaterally, moving normal volumes of air  Abd: soft, tender, nondistended, BS minimal Ext: warm and well perfused, no pedal edema, dry skin  Neuro: alert and oriented X3, cranial nerves II-XII grossly intact Lab Results Lab Results  Component Value Date   WBC 26.4* 05/05/2012   HGB 11.3* 05/05/2012   HCT 34.6* 05/05/2012   MCV 88.9 05/05/2012   PLT 166 05/05/2012    Lab Results  Component Value Date   CREATININE 4.02* 05/05/2012   BUN 45* 05/05/2012   NA 132* 05/05/2012   K 3.4* 05/05/2012   CL 92* 05/05/2012   CO2 23 05/05/2012    Lab Results  Component Value Date   ALT 23 05/03/2012   AST 21 05/03/2012   ALKPHOS 119* 05/03/2012   BILITOT 1.5* 05/03/2012      Microbiology: Recent Results (from the past 240 hour(s))  CULTURE, BLOOD (ROUTINE X 2)     Status: Normal   Collection Time   04/28/12  3:38 PM      Component Value Range Status Comment   Specimen Description BLOOD RIGHT HAND   Final    Special Requests BOTTLES DRAWN AEROBIC ONLY 5CC   Final    Culture  Setup Time 04/28/2012 22:13  Final    Culture NO GROWTH 5 DAYS   Final    Report Status 05/04/2012 FINAL   Final   CULTURE, BLOOD (ROUTINE X 2)     Status: Normal   Collection Time   04/28/12  3:48 PM      Component Value Range Status Comment   Specimen Description BLOOD RIGHT ARM   Final    Special Requests BOTTLES DRAWN AEROBIC ONLY 5CC   Final    Culture  Setup Time 04/28/2012 22:12   Final    Culture NO GROWTH 5 DAYS   Final    Report Status 05/04/2012 FINAL   Final   ANAEROBIC CULTURE     Status: Normal (Preliminary result)   Collection Time   04/30/12  5:15 PM      Component Value Range Status Comment   Specimen Description ABSCESS ABDOMEN   Final    Special Requests 4 SYRINGES @ 60CC   Final    Gram Stain     Final    Value: ABUNDANT WBC PRESENT, PREDOMINANTLY PMN     NO SQUAMOUS  EPITHELIAL CELLS SEEN     ABUNDANT GRAM POSITIVE COCCI IN PAIRS     IN CLUSTERS   Culture     Final    Value: NO ANAEROBES ISOLATED; CULTURE IN PROGRESS FOR 5 DAYS   Report Status PENDING   Incomplete   FUNGUS CULTURE W SMEAR     Status: Normal (Preliminary result)   Collection Time   04/30/12  5:15 PM      Component Value Range Status Comment   Specimen Description ABSCESS ABDOMEN   Final    Special Requests 4 SYRINGES @ 60CC   Final    Fungal Smear NO YEAST OR FUNGAL ELEMENTS SEEN   Final    Culture YEAST ISOLATED;ID TO FOLLOW   Final    Report Status PENDING   Incomplete   CULTURE, ROUTINE-ABSCESS     Status: Normal   Collection Time   04/30/12  5:15 PM      Component Value Range Status Comment   Specimen Description ABSCESS ABDOMEN   Final    Special Requests 4 SYRINGES @ 60CC   Final    Gram Stain     Final    Value: ABUNDANT WBC PRESENT, PREDOMINANTLY PMN     NO SQUAMOUS EPITHELIAL CELLS SEEN     ABUNDANT GRAM POSITIVE COCCI IN PAIRS     IN CLUSTERS   Culture FEW YEAST CONSISTENT WITH CANDIDA SPECIES   Final    Report Status 05/04/2012 FINAL   Final     Studies/Results: Ct Abdomen Pelvis W Contrast  05/04/2012  *RADIOLOGY REPORT*  Clinical Data: Status post surgery to treat the small bowel obstruction.  A right lower quadrant abscess was treated with percutaneous drainage on 04/30/2012.  CT ABDOMEN AND PELVIS WITH CONTRAST  Technique:  Multidetector CT imaging of the abdomen and pelvis was performed following the standard protocol during bolus administration of intravenous contrast.  Contrast:  75 ml Omnipaque-300 IV  Comparison: 04/30/2012 and 04/29/2012  Findings: Visualized lung bases show a left basilar pleural effusion and consolidation/atelectasis of the posterior left lower lobe.  There is extravasation of oral contrast from the small bowel into a residual abscess cavity within the right lower quadrant of the abdomen.  The percutaneous drain is located in the  posterior/dependent aspect of the cavity and shows some surrounding debris which may be acting to occlude/partially occlude the catheter.  The abscess  cavity remains of significant size, measuring over 10 cm in greatest dimensions.  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  Consider repositioning/upsizing of the current percutaneous drain or placement of a second drain into the more anterior aspect of the residual collection.  No new abscess collections are identified. There is no evidence of gross free intraperitoneal air.  Solid organs of the abdomen are stable in appearance.  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 Indwelling percutaneous drain lies in the dependent portion of this collection which may contain debris obstructing the catheter. Given the large residual collection present, further drain manipulation including potential repositioning and upsizing of the current drain and also potentially placement of a second more anteriorly positioned drain may be helpful.  This will be communicated to the interventional Radiology service.  Original Report Authenticated By: Reola Calkins, M.D.    Assessment: Pt continues to have increased WBC count despite antibiotic treatment. Abdominal CT was done yesterday showing a large residual abscess and cutaneous fistula from the abscess cavity. Radiology recommended further drain manipulation through repositioning and upsizing vs. Placement of a second drain. IR will perform procedure today.   There was a thought that Pt had a C.  Diff infection causing his very high WBC count, but this is unlikely as pt has no complaints of diarrhea and is not currently producing any stool as he is receiving TNA.  Lab was called to confirm whether or not any culture growth was seen. They confirmed that only small amounts of yeast have grown on culture despite Gram stain showing GPC.   Plan: 1. Intraabdominal Abscess - continue Zosyn and Micafungin - obtain fluid for culture (for aerobes, anaerobes, and fungus) during procedure by IR if possible - Will continue to check culture results   Lenox Ahr [facility] 05/05/2012, 9:18 AM  This is a Psychologist, occupational Note. The care of the patient was discussed with Dr. Daiva Eves and the assessment and plan was formulated with their assistance. Please see their note for official documentation of the patient encounter.     INFECTIOUS DISEASE ATTENDING ADDENDUM:     [facility]   Date: 05/05/2012  Patient name: [name]  Medical record number: [id number]  Date of birth: 1961-05-10    This patient has been seen and discussed with the house staff. Please see their note for complete details. I concur with their findings with the following additions/corrections:  Paulette Blanch [place] 05/05/2012, 5:18 PM  Patient had IR guided repositioning of drain with removal of 200 mL of brown fluid from  abscess today. Apparently none of this was sent for culture which would've been helpful. Note I had specifically put orders in the computer to try to ensure that cultures would be sent. It is my understanding the patient may have a second drain placed in it this is the case perhaps cultures could be sent this time to help guide therapy. I am bothered by the patient's low blood pressures during hemodialysis and have contemplated adding vancomycin to  his very broad spectrum antibiotic regimen. Again it would be nice to get  cultures prior to jumping to simply overly broad-spectrum antibiotics.

## 2012-05-05 NOTE — Progress Notes (Signed)
[facility] PROGRESS NOTE  KLINT LEZCANO WUJ:[id number] DOB: [birth year] DOA: 04/02/2012 PCP: Trevor Iha, MD  Assessment/Plan: SBO (small bowel obstruction) s/p lysis of adhesions and partial small bowel resection 7/24 *Subsequent anastomosis dehiscence / SBO s/p lysis of adhesions, small bowel resection, omental patch of anastomosis 8/2  *CT abdomen and pelvis 04/29/2012 demonstrated several fluid collections consistent with intra-abdominal abscess with the largest measuring 16.5 x 13.7 x 6.4 cm.  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 He underwent drain reposition today. 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 *An attempt was made on 04/21/2012 to place a central line in interventional radiology under fluoroscopy but unfortunately central venous access could not be placed due to upper extremity and internal jugular vein access being chronically occluded bilaterally therefore rationale for why we unfortunately have to continue to use this patient's right femoral Vas-Cath port for parenteral nutrition  Chronic nonocclusive left lower extremity DVT Diagnosed via Doppler 04/30/2012  Ileus following gastrointestinal surgery *Cont TNA via right femoral Vas-Cath port per surgery  Atypical chest pain/abnormal cardiac enzyme *Had episode of severe chest pain during this hospitalization. CT of the chest revealed no evidence of pulmonary embolism or aortic dissection *Suspect mildly elevated cardiac  isoenzymes related to demand ischemia versus recent stressors of recurrent surgical procedures  CKD (chronic kidney disease) stage V requiring chronic dialysis *Dialysis per nephrology  Hypokalemia/hypophosphatemia/hyponatremia *Management per nephrology service/TNA  Thrombocytopenia *Resolved  Normocytic Anemia  *Stable. Due to acute blood loss and due to chronic disease *Received packed red blood cells on 04/25/2012 *Cont aranesp and ferric gluconate  HTN (hypertension) Fair control  Chronic use of steroids On very slow steroid taper  Malnutrition, calorie *Patient cachectic at presentation  *Now with acute malnutrition related to postoperative ileus and surgical complications *Parenteral nutrition managed by surgery and pharmacy  Leg dialysis graft occlusion *s/p thrombectomy 7/25 - unfortunately re clotted-has undergone an additional thrombectomy procedure with revision of arterial and graft on 04/27/2012 *Currently being dialyzed via groin HD catheter- VVS following  DVT prophylaxis: SCDs Code Status: full  Disposition Plan: Remain in step down  Consultants: General Surgery [facility] Infectious disease  Events since admission: 7/13 CT Abdomen with SBO  7/24 Ex-lap, lysis of adhesions, partial small bowel resection x2  7/25 Thrombectomy L femoral AV graft  7/26 Extubated, CVL is out  7/27 Transferred to telemetry under [facility]  8/2 Acute abdomen, dehiscence of anastomosis, SBO, back from OR intubated in ICU on PCCM service. Found to have moderate peritonitis infraumbilically 8/2 Extubated  Antibiotics: Zosyn 8/1 >>> Micafungin 8/3 >> Invanz 7/24- periop Fluconazole- 8/2- one dose Vancomycin 8/8 >>> 05/01/2012  HPI/Subjective: Feels ok. No complaints.  Objective: Filed Vitals:   05/05/12 1300 05/05/12 1400 05/05/12 1630 05/05/12 1700  BP: 151/78 164/79 167/81 162/79  Pulse: 68 71 74 70  Temp:   97.5 F (36.4 C)   TempSrc:   Oral   Resp: 14 16 16  15   Height:      Weight:      SpO2: 100% 100% 100% 99%    Intake/Output Summary (Last 24 hours) at 05/05/12 1848 Last data filed at 05/05/12  1700  Gross per 24 hour  Intake   1820 ml  Output    150 ml  Net   1670 ml    Exam:   General:  Alert, appears calm, in no acute distress  Cardiovascular: RRR, no m/r/g. 2+ LLE edeam  Respiratory: CTA bilaterally, no w/r/r. Normal respiratory effort.  Data Reviewed: Basic Metabolic Panel:  Lab 05/05/12 4782 05/04/12 0345 05/03/12 9562 05/02/12 0452 05/01/12 1002 04/30/12 0724 04/30/12 0500 04/29/12 0710  NA 132* 134* 128* 132* 129* -- -- --  K 3.4* 4.0 3.3* 3.5 3.6 -- -- --  CL 92* 97 92* 96 93* -- -- --  CO2 23 25 20 23 21  -- -- --  GLUCOSE 120* 141* 132* 154* 120* -- -- --  BUN 45* 45* 76* 45* 60* -- -- --  CREATININE 4.02* 4.02* 5.67* 3.61* 4.31* -- -- --  CALCIUM 9.8 9.9 9.8 9.3 9.4 -- -- --  MG -- 1.7 -- -- -- -- -- 1.6  PHOS 3.0 2.0* -- -- 2.7 2.7 3.3 --   Liver Function Tests:  Lab 05/03/12 0623 05/01/12 1002 04/30/12 0724 04/29/12 0710  AST 21 -- -- 24  ALT 23 -- -- 14  ALKPHOS 119* -- -- 70  BILITOT 1.5* -- -- 1.2  PROT 5.4* -- -- 4.8*  ALBUMIN 1.5* 1.5* 1.6* 1.4*   CBC:  Lab 05/05/12 0635 05/04/12 0800 05/03/12 0623 05/03/12 0535 05/01/12 1002 04/30/12 0500  WBC 26.4* 49.6* 27.7* 26.4* 19.2* --  NEUTROABS -- -- -- 23.2* -- 19.1*  HGB 11.3* 12.0* 10.3* 10.7* 10.1* --  HCT 34.6* 35.7* 30.5* 31.5* 28.8* --  MCV 88.9 85.2 84.0 84.9 84.0 --  PLT 166 181 176 169 180 --   CBG:  Lab 05/05/12 1639 05/05/12 1125 05/05/12 0821 05/05/12 0423 05/05/12 0012  GLUCAP 129* 146* 147* 132* 168*    Recent Results (from the past 240 hour(s))  CULTURE, BLOOD (ROUTINE X 2)     Status: Normal   Collection Time   04/28/12  3:38 PM      Component Value Range Status Comment   Specimen Description BLOOD RIGHT HAND   Final    Special Requests BOTTLES DRAWN AEROBIC ONLY 5CC   Final    Culture  Setup Time 04/28/2012 22:13   Final     Culture NO GROWTH 5 DAYS   Final    Report Status 05/04/2012 FINAL   Final   CULTURE, BLOOD (ROUTINE X 2)     Status: Normal   Collection Time   04/28/12  3:48 PM      Component Value Range Status Comment   Specimen Description BLOOD RIGHT ARM   Final    Special Requests BOTTLES DRAWN AEROBIC ONLY 5CC   Final    Culture  Setup Time 04/28/2012 22:12   Final    Culture NO GROWTH 5 DAYS   Final    Report Status 05/04/2012 FINAL   Final   ANAEROBIC CULTURE     Status: Normal   Collection Time   04/30/12  5:15 PM      Component Value Range Status Comment   Specimen Description ABSCESS ABDOMEN   Final    Special Requests 4 SYRINGES @ 60CC   Final    Gram Stain     Final    Value: ABUNDANT WBC PRESENT, PREDOMINANTLY PMN     NO SQUAMOUS EPITHELIAL CELLS SEEN     ABUNDANT GRAM POSITIVE COCCI IN PAIRS  IN CLUSTERS   Culture NO ANAEROBES ISOLATED   Final    Report Status 05/05/2012 FINAL   Final   FUNGUS CULTURE W SMEAR     Status: Normal (Preliminary result)   Collection Time   04/30/12  5:15 PM      Component Value Range Status Comment   Specimen Description ABSCESS ABDOMEN   Final    Special Requests 4 SYRINGES @ 60CC   Final    Fungal Smear NO YEAST OR FUNGAL ELEMENTS SEEN   Final    Culture YEAST ISOLATED;ID TO FOLLOW   Final    Report Status PENDING   Incomplete   CULTURE, ROUTINE-ABSCESS     Status: Normal   Collection Time   04/30/12  5:15 PM      Component Value Range Status Comment   Specimen Description ABSCESS ABDOMEN   Final    Special Requests 4 SYRINGES @ 60CC   Final    Gram Stain     Final    Value: ABUNDANT WBC PRESENT, PREDOMINANTLY PMN     NO SQUAMOUS EPITHELIAL CELLS SEEN     ABUNDANT GRAM POSITIVE COCCI IN PAIRS     IN CLUSTERS   Culture FEW YEAST CONSISTENT WITH CANDIDA SPECIES   Final    Report Status 05/04/2012 FINAL   Final     Scheduled Meds: reviewed Continuous Infusions:  reviewed _________________________________________________________________  05/05/2012, 6:48 PM  LOS: 33 days    Brendia Sacks, MD [facility] Team [phone]

## 2012-05-05 NOTE — H&P (Signed)
[name] is an 51 y.o. male.   Chief Complaint: post op Small bowel obstruction surgery abscess Abscess drain placed 04/30/12 by IR Wbc higher; fever Re CT 8/13 shows larger abscess Scheduled now for drain repositioning/upsizing vs additional new drain placement HPI: ESRD; prostate ca  Past Medical History  Diagnosis Date  . Dialysis patient   . Prostate cancer   . Renal insufficiency     Past Surgical History  Procedure Date  . Laparotomy 04/14/2012    Procedure: EXPLORATORY LAPAROTOMY;  Surgeon: Clovis Pu. Cornett, MD;  Location: [facility] OR;  Service: General;  Laterality: N/A;  . Colon resection 04/14/2012  . Laparotomy 04/22/2012    Procedure: EXPLORATORY LAPAROTOMY;  Surgeon: Ardeth Sportsman, MD;  Location: [facility] OR;  Service: General;  Laterality: N/A;  lysis of adhesions, omentoplasty, repair small bowel  . Thrombectomy w/ embolectomy 04/27/2012    Procedure: THROMBECTOMY ARTERIOVENOUS GORE-TEX GRAFT;  Surgeon: Chuck Hint, MD;  Location: [facility] OR;  Service: Vascular;  Laterality: Left;  Thrombectomy of left thigh arteriovenous gortex graft    Family History  Problem Relation Age of Onset  . Hypertension Father   . Pneumonia Brother   . Lung disease Brother    Social History:  reports that he has never smoked. He does not have any smokeless tobacco history on file. He reports that he does not drink alcohol or use illicit drugs.  Allergies:  Allergies  Allergen Reactions  . Allopurinol     REACTION: decreased platelets  . Aspirin     REACTION: unspecified    Medications Prior to Admission  Medication Sig Dispense Refill  . colchicine 0.6 MG tablet Take 0.6 mg by mouth daily.      Marland Kitchen lanthanum (FOSRENOL) 1000 MG chewable tablet Chew 2,000 mg by mouth 3 (three) times daily with meals.      . multivitamin (RENA-VIT) TABS tablet Take 1 tablet by mouth daily.      . predniSONE (DELTASONE) 5 MG tablet Take 5 mg by mouth daily.        Results for orders placed during the  hospital encounter of 04/02/12 (from the past 48 hour(s))  GLUCOSE, CAPILLARY     Status: Abnormal   Collection Time   05/03/12 12:09 PM      Component Value Range Comment   Glucose-Capillary 174 (*) 70 - 99 mg/dL   GLUCOSE, CAPILLARY     Status: Abnormal   Collection Time   05/03/12  4:07 PM      Component Value Range Comment   Glucose-Capillary 110 (*) 70 - 99 mg/dL   GLUCOSE, CAPILLARY     Status: Abnormal   Collection Time   05/03/12  8:42 PM      Component Value Range Comment   Glucose-Capillary 165 (*) 70 - 99 mg/dL    Comment 1 Documented in Chart      Comment 2 Notify RN     GLUCOSE, CAPILLARY     Status: Abnormal   Collection Time   05/04/12 12:24 AM      Component Value Range Comment   Glucose-Capillary 155 (*) 70 - 99 mg/dL    Comment 1 Documented in Chart      Comment 2 Notify RN     BASIC METABOLIC PANEL     Status: Abnormal   Collection Time   05/04/12  3:45 AM      Component Value Range Comment   Sodium 134 (*) 135 - 145 mEq/L  Potassium 4.0  3.5 - 5.1 mEq/L DELTA CHECK NOTED   Chloride 97  96 - 112 mEq/L    CO2 25  19 - 32 mEq/L    Glucose, Bld 141 (*) 70 - 99 mg/dL    BUN 45 (*) 6 - 23 mg/dL DELTA CHECK NOTED   Creatinine, Ser 4.02 (*) 0.50 - 1.35 mg/dL    Calcium 9.9  8.4 - 78.2 mg/dL    GFR calc non Af Amer 16 (*) >90 mL/min    GFR calc Af Amer 18 (*) >90 mL/min   MAGNESIUM     Status: Normal   Collection Time   05/04/12  3:45 AM      Component Value Range Comment   Magnesium 1.7  1.5 - 2.5 mg/dL   PHOSPHORUS     Status: Abnormal   Collection Time   05/04/12  3:45 AM      Component Value Range Comment   Phosphorus 2.0 (*) 2.3 - 4.6 mg/dL   GLUCOSE, CAPILLARY     Status: Abnormal   Collection Time   05/04/12  4:22 AM      Component Value Range Comment   Glucose-Capillary 140 (*) 70 - 99 mg/dL    Comment 1 Documented in Chart      Comment 2 Notify RN     CBC     Status: Abnormal   Collection Time   05/04/12  8:00 AM      Component Value Range  Comment   WBC 49.6 (*) 4.0 - 10.5 K/uL    RBC 4.19 (*) 4.22 - 5.81 MIL/uL    Hemoglobin 12.0 (*) 13.0 - 17.0 g/dL    HCT 95.6 (*) 21.3 - 52.0 %    MCV 85.2  78.0 - 100.0 fL    MCH 28.6  26.0 - 34.0 pg    MCHC 33.6  30.0 - 36.0 g/dL    RDW 08.6 (*) 57.8 - 15.5 %    Platelets 181  150 - 400 K/uL   GLUCOSE, CAPILLARY     Status: Abnormal   Collection Time   05/04/12  9:31 AM      Component Value Range Comment   Glucose-Capillary 119 (*) 70 - 99 mg/dL   GLUCOSE, CAPILLARY     Status: Abnormal   Collection Time   05/04/12 12:50 PM      Component Value Range Comment   Glucose-Capillary 151 (*) 70 - 99 mg/dL    Comment 1 Notify RN     GLUCOSE, CAPILLARY     Status: Abnormal   Collection Time   05/04/12  4:15 PM      Component Value Range Comment   Glucose-Capillary 121 (*) 70 - 99 mg/dL   GLUCOSE, CAPILLARY     Status: Abnormal   Collection Time   05/04/12  8:21 PM      Component Value Range Comment   Glucose-Capillary 113 (*) 70 - 99 mg/dL    Comment 1 Documented in Chart      Comment 2 Notify RN     GLUCOSE, CAPILLARY     Status: Abnormal   Collection Time   05/05/12 12:12 AM      Component Value Range Comment   Glucose-Capillary 168 (*) 70 - 99 mg/dL    Comment 1 Documented in Chart      Comment 2 Notify RN     GLUCOSE, CAPILLARY     Status: Abnormal   Collection Time   05/05/12  4:23 AM  Component Value Range Comment   Glucose-Capillary 132 (*) 70 - 99 mg/dL    Comment 1 Documented in Chart      Comment 2 Notify RN     BASIC METABOLIC PANEL     Status: Abnormal   Collection Time   05/05/12  6:35 AM      Component Value Range Comment   Sodium 132 (*) 135 - 145 mEq/L    Potassium 3.4 (*) 3.5 - 5.1 mEq/L    Chloride 92 (*) 96 - 112 mEq/L    CO2 23  19 - 32 mEq/L    Glucose, Bld 120 (*) 70 - 99 mg/dL    BUN 45 (*) 6 - 23 mg/dL    Creatinine, Ser 4.09 (*) 0.50 - 1.35 mg/dL    Calcium 9.8  8.4 - 81.1 mg/dL    GFR calc non Af Amer 16 (*) >90 mL/min    GFR calc Af Amer  18 (*) >90 mL/min   PHOSPHORUS     Status: Normal   Collection Time   05/05/12  6:35 AM      Component Value Range Comment   Phosphorus 3.0  2.3 - 4.6 mg/dL   CBC     Status: Abnormal   Collection Time   05/05/12  6:35 AM      Component Value Range Comment   WBC 26.4 (*) 4.0 - 10.5 K/uL    RBC 3.89 (*) 4.22 - 5.81 MIL/uL    Hemoglobin 11.3 (*) 13.0 - 17.0 g/dL    HCT 91.4 (*) 78.2 - 52.0 %    MCV 88.9  78.0 - 100.0 fL    MCH 29.0  26.0 - 34.0 pg    MCHC 32.7  30.0 - 36.0 g/dL    RDW 95.6 (*) 21.3 - 15.5 %    Platelets 166  150 - 400 K/uL    Ct Abdomen Pelvis W Contrast  05/04/2012  *RADIOLOGY REPORT*  Clinical Data: Status post surgery to treat the small bowel obstruction.  A right lower quadrant abscess was treated with percutaneous drainage on 04/30/2012.  CT ABDOMEN AND PELVIS WITH CONTRAST  Technique:  Multidetector CT imaging of the abdomen and pelvis was performed following the standard protocol during bolus administration of intravenous contrast.  Contrast:  75 ml Omnipaque-300 IV  Comparison: 04/30/2012 and 04/29/2012  Findings: Visualized lung bases show a left basilar pleural effusion and consolidation/atelectasis of the posterior left lower lobe.  There is extravasation of oral contrast from the small bowel into a residual abscess cavity within the right lower quadrant of the abdomen.  The percutaneous drain is located in the posterior/dependent aspect of the cavity and shows some surrounding debris which may be acting to occlude/partially occlude the catheter.  The abscess cavity remains of significant size, measuring over 10 cm in greatest dimensions.  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  Consider repositioning/upsizing of the current percutaneous drain or placement of a second drain into the more anterior aspect of the residual collection.  No  new abscess collections are identified. There is no evidence of gross free intraperitoneal air.  Solid organs of the abdomen are stable in appearance.  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 Indwelling percutaneous drain lies in the dependent portion of this collection which may contain debris obstructing the catheter. Given the large residual collection present, further drain manipulation including potential repositioning and upsizing of the current drain and also potentially placement of a second more anteriorly positioned drain may be helpful.  This will be communicated to the interventional Radiology service.  Original Report Authenticated By: Reola Calkins, M.D.    Review of Systems  Constitutional: Negative for fever.  Cardiovascular: Negative for chest pain.  Gastrointestinal: Positive for nausea and abdominal pain.  Neurological: Positive for weakness.    Blood pressure 163/78, pulse 83, temperature 97.8 F (36.6 C), temperature source Oral, resp. rate 16, height 5\' 11"  (1.803 m), weight 142 lb 13.7 oz (64.8 kg), SpO2 100.00%. Physical Exam  Constitutional: He is oriented to person, place, and time.       Frail/ weak  Cardiovascular: Normal rate, regular rhythm and normal heart sounds.   No murmur heard. Respiratory: Effort normal and breath sounds normal. He has no wheezes.  GI: Soft. Bowel sounds are normal. There is tenderness.  Musculoskeletal: Normal range of motion.       Moves all 4s; in bed  Neurological: He is alert and oriented to person, place, and time.  Psychiatric: He has a normal mood and affect. His behavior is normal. Judgment and thought content normal.      Assessment/Plan Post op abd surgery abscess with drain placement 8/9 Wbc high; continued sxs; repeat CT shows larger abscess Now scheduled for repositioning/upsize vs new placement Pt aware of procedure benefits and risks and agreeable to proceed. Consent signed and in chart  Deserae Jennings A 05/05/2012, 8:58 AM

## 2012-05-05 NOTE — Progress Notes (Signed)
Very poor surgical candidate. i think the safest option for him is to turn this into a controlled fistula. IR to place large drain into cavity.

## 2012-05-05 NOTE — Procedures (Signed)
The abscess drain was successfully repositioned and upsized.  The tip of the catheter now extends into the large abscess cavity.  Approximately 200 ml of dark brown fluid was removed.  No immediate complication.

## 2012-05-05 NOTE — Progress Notes (Signed)
.  NGT output seems to be trending down [facility] Progress Note  Subjective:  On dialysis, no new complaints. Has already had bump in HR to 140's and hypotension with only 1 kg off.  Objective Filed Vitals:   05/05/12 0800 05/05/12 0900 05/05/12 1000 05/05/12 1100  BP: 163/78 162/77 165/84 150/64  Pulse: 83 82 83 81  Temp: 97.8 F (36.6 C)     TempSrc: Oral     Resp: 16 15 17 15   Height:      Weight:      SpO2: 100% 100% 99% 100%   Physical Exam General: comfortable, NG in place Heart: RRR Lungs: mostly clear Abdomen: distended, slightly painful.  Minimal bowel sounds. Drain in RLQ Extremities: L leg 2+ asymmetric edema throughout leg, lower>upper. No edema RLE Dialysis Access: left thigh AVGG patent  Dialysis Orders: Center: [facility] on TTS.  EDW 52.5 kg HD Bath 2K/2Ca Time 3.5 hrs Heparin 5000 U. Access AVG @ left thigh BFR 400 DFR 800 Zemplar 0 mcg IV/HD Epogen 0 Units IV/HD Venofer 50/week; last iPTH 314, but product high and no zemplar   Assessment/Plan:  1. SBO, s/p exp lap/SB resection 7/24 Dr. Luisa Hart, complicated by dehiscence with peritonitis, back to OR 8/1 for further SB resection and reanastomosis; Now s/p perc drainage of intraabd abcesses on 8/8. On IV zosyn and mycofungin.   Still with ileus/NGT per surgery. Also needing hydrotherapy to open wound. CT repeated, showed persistent fluid, IR planning to possibly revise the drain.  2. ESRD - HD normally  TTS at [facility]. temp R groin cath still in place, they are using for IV access; thigh graft s/p declot is functioning ok.  Did not tolerate UF with HD 8/13, will only UF 1-2 kg with HD tomorrow. Asked RN to recalibrate bed with new trapeze on.  3. Hypertension/volume - was on no BP meds at home. BP has been variable here.  Conidine stopped and hemodynamics seem improved.  Keep IV metoprolol.   Steroids being weaned. 4. Anemia of CKD - Getting max Aranesp. Hgb 10's; on full course of IV Fe; ferritin 552. PRN  transfusions, received 2 units last wk on 8/8 and 8/9. 5. Metabolic bone disease - No binders due to low phos being repleted.  Phos now within normal limits 6. 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  Asymmetric left leg edema may be related to this and to presence of L thigh access.  7. Hypercalcemia- adjusted Ca over 11, will decrease Ca++ in bath. Due to immobility.  8. Protein calorie malnutrition- on TNA 70cc/hr, getting lipids MWF due to shortage 9. S/p renal transplant - at [facility] in '87, started HD in 04/2003, remains on Prednisone. 10. Hepatitis C/thrombocytopenia - stable  Vinson Moselle  MD [facility] [phone] pgr    [phone] cell 05/05/2012, 1:03 PM      Additional Objective Labs: Basic Metabolic Panel:  Lab 05/05/12 4782 05/04/12 0345 05/03/12 0623 05/01/12 1002  NA 132* 134* 128* --  K 3.4* 4.0 3.3* --  CL 92* 97 92* --  CO2 23 25 20  --  GLUCOSE 120* 141* 132* --  BUN 45* 45* 76* --  CREATININE 4.02* 4.02* 5.67* --  CALCIUM 9.8 9.9 9.8 --  ALB -- -- -- --  PHOS 3.0 2.0* -- 2.7   Liver Function Tests:  Lab 05/03/12 0623 05/01/12 1002 04/30/12 0724 04/29/12 0710  AST 21 -- -- 24  ALT  23 -- -- 14  ALKPHOS 119* -- -- 70  BILITOT 1.5* -- -- 1.2  PROT 5.4* -- -- 4.8*  ALBUMIN 1.5* 1.5* 1.6* --   CBC:  Lab 05/05/12 0635 05/04/12 0800 05/03/12 0623 05/03/12 0535 05/01/12 1002 04/30/12 0500  WBC 26.4* 49.6* 27.7* -- -- --  NEUTROABS -- -- -- 23.2* -- 19.1*  HGB 11.3* 12.0* 10.3* -- -- --  HCT 34.6* 35.7* 30.5* -- -- --  MCV 88.9 85.2 84.0 84.9 84.0 --  PLT 166 181 176 -- -- --  Blood Culture    Component Value Date/Time   SDES ABSCESS ABDOMEN 04/30/2012 1715   SDES ABSCESS ABDOMEN 04/30/2012 1715   SDES ABSCESS ABDOMEN 04/30/2012 1715   SPECREQUEST 4 SYRINGES @ 60CC 04/30/2012 1715   SPECREQUEST 4 SYRINGES @ 60CC 04/30/2012 1715   SPECREQUEST 4 SYRINGES @ 60CC 04/30/2012 1715   CULT NO ANAEROBES ISOLATED 04/30/2012  1715   CULT YEAST ISOLATED;ID TO FOLLOW 04/30/2012 1715   CULT FEW YEAST CONSISTENT WITH CANDIDA SPECIES 04/30/2012 1715   REPTSTATUS 05/05/2012 FINAL 04/30/2012 1715   REPTSTATUS PENDING 04/30/2012 1715   REPTSTATUS 05/04/2012 FINAL 04/30/2012 1715  Cardiac Enzymes: No results found for this basename: CKTOTAL:5,CKMB:5,CKMBINDEX:5,TROPONINI:5 in the last 168 hoursCBG:  Lab 05/05/12 1125 05/05/12 0821 05/05/12 0423 05/05/12 0012 05/04/12 2021  GLUCAP 146* 147* 132* 168* 113*  Medications:    . fat emulsion 240 mL (05/03/12 1717)  . fat emulsion    . TPN (CLINIMIX) +/- additives 70 mL/hr at 05/03/12 1717  . TPN (CLINIMIX) +/- additives 70 mL/hr at 05/04/12 1800  . TPN (CLINIMIX) +/- additives        . antiseptic oral rinse  15 mL Mouth Rinse q12n4p  . chlorhexidine  15 mL Mouth Rinse BID  . darbepoetin (ARANESP) injection - DIALYSIS  200 mcg Intravenous Q Sat-HD  . fentaNYL  37.5 mcg Transdermal Q72H  . ferric gluconate (FERRLECIT/NULECIT) IV  125 mg Intravenous Q T,Th,Sa-HD  . hydrocortisone sod succinate (SOLU-CORTEF) injection  25 mg Intravenous Q12H  . insulin aspart  0-15 Units Subcutaneous Q4H  . iohexol  20 mL Oral Q1 Hr x 2  . lip balm   Topical BID  . metoprolol  5 mg Intravenous Q6H  . micafungin (MYCAMINE) IV  100 mg Intravenous Daily  . morphine      . pantoprazole (PROTONIX) IV  40 mg Intravenous Q24H  . piperacillin-tazobactam (ZOSYN)  IV  2.25 g Intravenous Q8H  . sodium chloride  10-40 mL Intracatheter Q12H  . sodium phosphate  Dextrose 5% IVPB  10 mmol Intravenous Once

## 2012-05-05 NOTE — Progress Notes (Signed)
ANTIBIOTIC CONSULT NOTE - FOLLOW UP  Pharmacy Consult for zosyn Indication: peritonitis  Allergies  Allergen Reactions  . Allopurinol     REACTION: decreased platelets  . Aspirin     REACTION: unspecified    Patient Measurements: Height: 5\' 11"  (180.3 cm) Weight: 142 lb 13.7 oz (64.8 kg) IBW/kg (Calculated) : 75.3   Vital Signs: Temp: 97.8 F (36.6 C) (08/14 0800) Temp src: Oral (08/14 0800) BP: 162/77 mmHg (08/14 0900) Pulse Rate: 82  (08/14 0900) Intake/Output from previous day: 08/13 0701 - 08/14 0700 In: 1950 [IV Piggyback:100; TPN:1800] Out: 250 [Emesis/NG output:250] Intake/Output from this shift: Total I/O In: 140 [TPN:140] Out: -   Labs:  Basename 05/05/12 0635 05/04/12 0800 05/04/12 0345 05/03/12 0623  WBC 26.4* 49.6* -- 27.7*  HGB 11.3* 12.0* -- 10.3*  PLT 166 181 -- 176  LABCREA -- -- -- --  CREATININE 4.02* -- 4.02* 5.67*   Estimated Creatinine Clearance: 19.7 ml/min (by C-G formula based on Cr of 4.02).   Assessment: 51 yo male with suspected peritonitis on zosyn and micafungin. WBC remains elevated with open abd wound. Cultures have remained neg but pt has been on abx for a long time. Now with even larger abscesses per CT. Plan for drain repositioning vs new drain.   8/1 Zosyn>> 8/2 Fluconazole>>8/3 8/3 micafungin>>  8/3 blood x2- NGTD   Plan:  -Cont zosyn 2.25g IV q8 -Micafungin 100mg  IV q24

## 2012-05-05 NOTE — Progress Notes (Signed)
Hydrotherapy Note:  Pt currently leaking bile from bottom of wound.  Spoke with surgery service, and hydrotherapy to hold treatment for today following up tomorrow for further details.  Will definitely not be performing VAC dressing changes, but continue with wet to dry dressing.  To follow.  Thanks!  05/05/2012 Cephus Shelling, PT, DPT [phone]

## 2012-05-05 NOTE — Progress Notes (Signed)
Patient's mid abdominal incision dressing saturated with bile colored secretions. Patient denies pain or discomfort. Dressing changed per current order. Dr. Janee Morn notified, no additional orders received at this time.

## 2012-05-05 NOTE — Progress Notes (Signed)
Patient ID: [name], male   DOB: 1960/11/24, 51 y.o.   MRN: [id number] Patient ID: [name], male   DOB: [birth year], 77 y.o.   MRN: [id number] Patient ID: [name], male   DOB: Dec 21, 1960, 47 y.o.   MRN: [id number] 8 Days Post-Op  Subjective: In bed A/A/O  No c/o back pain at present. No other c/o offered at this time.   Objective: Vital signs in last 24 hours: Temp:  [98 F (36.7 C)-98.6 F (37 C)] 98.2 F (36.8 C) (08/14 0400) Pulse Rate:  [79-142] 79  (08/14 0700) Resp:  [13-19] 13  (08/14 0700) BP: (93-157)/(38-80) 157/77 mmHg (08/14 0700) SpO2:  [99 %-100 %] 100 % (08/14 0700) Weight:  [142 lb 13.7 oz (64.8 kg)] 142 lb 13.7 oz (64.8 kg) (08/14 0000) Last BM Date:  (unable to verbalize last bm)  Intake/Output from previous day: 08/13 0701 - 08/14 0700 In: 1690 [IV Piggyback:50; TPN:1590] Out: 250 [Emesis/NG output:250] Intake/Output this shift:    Wound clean, minimal drainage, at present from wound, nurse reports drainage similar to NG tube drainage eminating from the lower one third of his abdominal incision. abdomen soft. No BS. Minimal drainage from perc drain ( 20ml for past 24 hrs recorded) NG remains in place. (250 ml recorded as output for past 24 hrs) WBC have trended upward over past couple of days max at 49.6 yesterday now trending downward again and currently at 26.4. VS remain stable and he remains afebrile. Labs stable. 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 There also is evidence of a cutaneous fistula from the  abscess cavity to the level of the anterior abdominal wound.  Indwelling percutaneous drain lies in the dependent portion of this  collection which may contain debris obstructing the catheter.  Given the large residual collection present, further drain  manipulation including potential repositioning and upsizing of the  current  drain and also potentially placement of a second more  anteriorly positioned drain may be helpful. This will be  communicated to the interventional Radiology service.     Lab Results:   Basename 05/05/12 0635 05/04/12 0800  WBC 26.4* 49.6*  HGB 11.3* 12.0*  HCT 34.6* 35.7*  PLT 166 181   BMET  Basename 05/04/12 0345 05/03/12 0623  NA 134* 128*  K 4.0 3.3*  CL 97 92*  CO2 25 20  GLUCOSE 141* 132*  BUN 45* 76*  CREATININE 4.02* 5.67*  CALCIUM 9.9 9.8   PT/INR No results found for this basename: LABPROT:2,INR:2 in the last 72 hours ABG No results found for this basename: PHART:2,PCO2:2,PO2:2,HCO3:2 in the last 72 hours  Studies/Results: Ct Abdomen Pelvis W Contrast  05/04/2012  *RADIOLOGY REPORT*  Clinical Data: Status post surgery to treat the small bowel obstruction.  A right lower quadrant abscess was treated with percutaneous drainage on 04/30/2012.  CT ABDOMEN AND PELVIS WITH CONTRAST  Technique:  Multidetector CT imaging of the abdomen and pelvis was performed following the standard protocol during bolus administration of intravenous contrast.  Contrast:  75 ml Omnipaque-300 IV  Comparison: 04/30/2012 and 04/29/2012  Findings: Visualized lung bases show a left basilar pleural effusion and consolidation/atelectasis of the posterior left lower lobe.  There is extravasation of oral contrast from the small bowel into a residual abscess cavity within the right lower quadrant of the abdomen.  The percutaneous drain is located in the posterior/dependent  aspect of the cavity and shows some surrounding debris which may be acting to occlude/partially occlude the catheter.  The abscess cavity remains of significant size, measuring over 10 cm in greatest dimensions.  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  Consider repositioning/upsizing of the current  percutaneous drain or placement of a second drain into the more anterior aspect of the residual collection.  No new abscess collections are identified. There is no evidence of gross free intraperitoneal air.  Solid organs of the abdomen are stable in appearance.  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 Indwelling percutaneous drain lies in the dependent portion of this collection which may contain debris obstructing the catheter. Given the large residual collection present, further drain manipulation including potential repositioning and upsizing of the current drain and also potentially placement of a second more anteriorly positioned drain may be helpful.  This will be communicated to the interventional Radiology service.  Original Report Authenticated By: Reola Calkins, M.D.    Anti-infectives: Anti-infectives     Start     Dose/Rate Route Frequency Ordered Stop   04/30/12 1400   vancomycin (VANCOCIN) 750 mg in sodium chloride 0.9 % 150 mL IVPB  Status:  Discontinued        750 mg 150 mL/hr over 60 Minutes Intravenous  Once 04/30/12 1212 04/30/12 1507   04/29/12 2330   vancomycin (VANCOCIN) 1,500 mg in sodium chloride 0.9 % 500 mL IVPB        1,500 mg 250 mL/hr over 120 Minutes Intravenous  Once 04/29/12 2259 04/30/12 0246   04/27/12 0000   ceFAZolin (ANCEF) IVPB 1 g/50 mL premix  Status:  Discontinued     Comments: Send with pt to OR      1 g 100 mL/hr over 30 Minutes Intravenous On call 04/26/12 0927 04/26/12 0928   04/24/12 1000   micafungin (MYCAMINE) 100 mg in sodium chloride 0.9 % 100 mL IVPB        100 mg 100 mL/hr over 1 Hours Intravenous Daily 04/24/12 0750     04/23/12 1600    fluconazole (DIFLUCAN) IVPB 400 mg        400 mg 200 mL/hr over 60 Minutes Intravenous  Once 04/23/12 1428 04/23/12 1800   04/22/12 2200   piperacillin-tazobactam (ZOSYN) IVPB 3.375 g  Status:  Discontinued        3.375 g 100 mL/hr over 30 Minutes Intravenous 3 times per day 04/22/12 2120 04/22/12 2137   04/22/12 2200   piperacillin-tazobactam (ZOSYN) IVPB 2.25 g        2.25 g 100 mL/hr over 30 Minutes Intravenous 3 times per day 04/22/12 2140     04/22/12 2157   gentamicin (GARAMYCIN) injection  Status:  Discontinued          As needed 04/22/12 2158 04/22/12 2335   04/22/12 2156   clindamycin (CLEOCIN) injection  Status:  Discontinued          As needed 04/22/12 2157 04/22/12 2335   04/22/12 2145   piperacillin-tazobactam (ZOSYN) IVPB 3.375 g  3.375 g 12.5 mL/hr over 240 Minutes Intravenous  Once 04/22/12 2141 04/23/12 0145   04/22/12 2130   gentamicin (GARAMYCIN) 240 mg, clindamycin (CLEOCIN) 900 mg in sodium chloride irrigation 0.9 % 1,000 mL irrigation  Status:  Discontinued         Irrigation  Once 04/22/12 2119 04/23/12 0020   04/14/12 0830   ertapenem (INVANZ) 1 g in sodium chloride 0.9 % 50 mL IVPB        1 g 100 mL/hr over 30 Minutes Intravenous To Surgery 04/14/12 0816 04/14/12 0834   04/13/12 1359   ertapenem (INVANZ) 1 g in sodium chloride 0.9 % 50 mL IVPB  Status:  Discontinued        1 g 100 mL/hr over 30 Minutes Intravenous 60 min pre-op 04/13/12 1359 04/14/12 0816          Assessment/Plan: s/p Procedure(s) (LRB): THROMBECTOMY ARTERIOVENOUS GORE-TEX GRAFT (Left) REVISION OF ARTERIOVENOUS GORETEX GRAFT (Left)  Continue abx, drain  Dr. Carolynne Edouard aware of CT report; He will discuss upsizing of perc drain with IR, lack of surgical options discussed at length with patient by Dr. Carolynne Edouard , and he is agreeable to IR approach.    LOS: 33 days    Adarius Tigges 05/05/2012

## 2012-05-05 NOTE — Progress Notes (Signed)
PARENTERAL NUTRITION CONSULT NOTE - FOLLOW UP  Pharmacy Consult for TPN Indication: SBO, s/p LOA/SBR/anastamosis , s/p LOA/SBR/re-anastamosis with patch repair for leak  Allergies  Allergen Reactions  . Allopurinol     REACTION: decreased platelets  . Aspirin     REACTION: unspecified    Patient Measurements: Height: 5\' 11"  (180.3 cm) Weight: 142 lb 13.7 oz (64.8 kg) IBW/kg (Calculated) : 75.3   Vital Signs: Temp: 98.2 F (36.8 C) (08/14 0400) Temp src: Oral (08/14 0400) BP: 157/77 mmHg (08/14 0700) Pulse Rate: 79  (08/14 0700) Intake/Output from previous day: 08/13 0701 - 08/14 0700 In: 1740 [IV Piggyback:100; TPN:1590] Out: 250 [Emesis/NG output:250] Intake/Output from this shift:    Labs:  [facility] 05/05/12 0635 05/04/12 0800 05/03/12 0623  WBC 26.4* 49.6* 27.7*  HGB 11.3* 12.0* 10.3*  HCT 34.6* 35.7* 30.5*  PLT 166 181 176  APTT -- -- --  INR -- -- --     Basename 05/05/12 0635 05/04/12 0345 05/03/12 0623 05/03/12 0535  NA 132* 134* 128* --  K 3.4* 4.0 3.3* --  CL 92* 97 92* --  CO2 23 25 20  --  GLUCOSE 120* 141* 132* --  BUN 45* 45* 76* --  CREATININE 4.02* 4.02* 5.67* --  LABCREA -- -- -- --  CREAT24HRUR -- -- -- --  CALCIUM 9.8 9.9 9.8 --  MG -- 1.7 -- --  PHOS 3.0 2.0* -- --  PROT -- -- 5.4* --  ALBUMIN -- -- 1.5* --  AST -- -- 21 --  ALT -- -- 23 --  ALKPHOS -- -- 119* --  BILITOT -- -- 1.5* --  BILIDIR -- -- -- --  IBILI -- -- -- --  PREALBUMIN -- -- -- 25.1  TRIG -- -- -- 71  CHOLHDL -- -- -- --  CHOL -- -- -- 121   Estimated Creatinine Clearance: 19.7 ml/min (by C-G formula based on Cr of 4.02).    Basename 05/05/12 0423 05/05/12 0012 05/04/12 2021  GLUCAP 132* 168* 113*    Insulin Requirements in the past 12 hours:  7 units SSI + 20 units insulin R in TNA  Nutritional Goals:  1700-1950 kCal, 75-85 grams of protein per day  Current Nutrition:  Clinimix 5/15 at 70 ml/hr with 20% lipids at 10 ml/hr MWF provides an average of  1399 kcal and 84g protein per day  Admit:  Admitted 7/13 with abd pain, weight loss, SBO. S/p LOA, SBR 724 and repeat LOA, SBR with anasomotic leak repair 8/1 , thrombectomy with AVG revision 7/25 and repeat thrombectomy 8/6.  GI: Has been essentially NPO since admit, therefore at significant risk for re-feeding, but has been tolerating TPN thus far. NGT output slightly incr to 350cc/24h yesterday. No wound drainage output charted on 8/13 however it is noted that bile-colored secretion draining from lower abd incision. IR considering upsizing perc drain Endo:  CBGs/24h 121-168 (goal < 180).On moderate SSI q4 + 20 units insulin R in TPN. On Solu-Cortef-25mg  bid. Lytes:  Na 132 and low likely d/t overload and Clinimix without supplemented lytes. Corrected Ca~11.8,slightly high despite no lytes in TNA. Phos 3, Ca x PO4 product <55. K 3.4. Goals for lytes with ileus are K~4, Mg~2, and Phos~3.5 Renal:  ESRD w/HD typically T/TH/Sat - currently receiving daily HD d/t fluid overload with ongoing TPN. On Nulecit, Aranesp-Sat Pulm: Extubated on 8/1; 100% on RA Cards: BP low-elevated (BP/24h: 90-160/40-80) -- lows likely d/t HD. HR 70s. On scheduled lopressor and prn hydralazine.  Hepatobil: LFTs, trig and chol WNL.  Alkphos and tbil are elevated. Albumin is low. Prealbumin is at goal. Neuro: GCS 15,  Pain 1/10- on prn pain meds. ID: Micafugin + Zosyn for peritonitis/abd abscess. Afeb, WBC 26.4 << 49.6. Abd abscess culture: yeast. Per ID would recommend drain placement by IR to help remove infection better guide abx Best Practices:  Mouthcare, PPI-IV   Plan:  1. Continue Clinimix 5/15 at 70 ml/hr, which meets protein goal but is just below kcal. 2.  Will increase insulin to 25 units/bag in TPN tonight 3.  Provide available trace elements, MVI and lipids @10cc /hr on MWF only d/t national backorder. 4. Will f/u TNA labs  Georgina Pillion, PharmD, BCPS Clinical Pharmacist Pager: [phone] 05/05/2012 8:39 AM

## 2012-05-05 NOTE — Progress Notes (Signed)
Called into pt room. Pt complaining of chest pressure that does not radiate. Pt blood pressure 204/87, HR 120's-130's, Pt rating pain a 6 out of 10. Mid level notified. EKG obtained,nitro and morphine given. Mid level in to assess pt. New orders received. Will continue to monitor pt. Stephen Dill RN

## 2012-05-06 ENCOUNTER — Inpatient Hospital Stay (HOSPITAL_COMMUNITY): Payer: Medicare Other

## 2012-05-06 LAB — GLUCOSE, CAPILLARY
Glucose-Capillary: 100 mg/dL — ABNORMAL HIGH (ref 70–99)
Glucose-Capillary: 95 mg/dL (ref 70–99)
Glucose-Capillary: 123 mg/dL — ABNORMAL HIGH (ref 70–99)

## 2012-05-06 LAB — CARDIAC PANEL(CRET KIN+CKTOT+MB+TROPI)
CK, MB: 1.6 ng/mL (ref 0.3–4.0)
Total CK: <7 U/L — ABNORMAL LOW (ref 7–232)
Troponin I: 1.76 ng/mL (ref <0.30)

## 2012-05-06 LAB — RENAL FUNCTION PANEL
CO2: 20 mEq/L (ref 19–32)
Calcium: 10.1 mg/dL (ref 8.4–10.5)
Chloride: 86 mEq/L — ABNORMAL LOW (ref 96–112)
GFR calc Af Amer: 12 mL/min — ABNORMAL LOW (ref >90)
GFR calc non Af Amer: 10 mL/min — ABNORMAL LOW (ref >90)
Potassium: 3.2 mEq/L — ABNORMAL LOW (ref 3.5–5.1)
Sodium: 124 mEq/L — ABNORMAL LOW (ref 135–145)

## 2012-05-06 LAB — CBC
Hemoglobin: 11.4 g/dL — ABNORMAL LOW (ref 13.0–17.0)
MCH: 28.7 pg (ref 26.0–34.0)
MCV: 85.1 fL (ref 78.0–100.0)
Platelets: 172 10*3/uL (ref 150–400)
RBC: 3.97 MIL/uL — ABNORMAL LOW (ref 4.22–5.81)
RBC: 3.89 MIL/uL — ABNORMAL LOW (ref 4.22–5.81)
WBC: 23.4 10*3/uL — ABNORMAL HIGH (ref 4.0–10.5)

## 2012-05-06 LAB — MAGNESIUM: Magnesium: 1.6 mg/dL (ref 1.5–2.5)

## 2012-05-06 MED ORDER — POTASSIUM CHLORIDE 10 MEQ/50ML IV SOLN
10.0000 meq | INTRAVENOUS | Status: AC
  Administered 2012-05-06 (×2): 10 meq via INTRAVENOUS
  Filled 2012-05-06: qty 150

## 2012-05-06 MED ORDER — INSULIN REGULAR HUMAN 100 UNIT/ML IJ SOLN
INTRAVENOUS | Status: AC
  Administered 2012-05-06: 18:00:00 via INTRAVENOUS
  Filled 2012-05-06: qty 2000

## 2012-05-06 MED ORDER — POTASSIUM CHLORIDE 10 MEQ/50ML IV SOLN
10.0000 meq | Freq: Once | INTRAVENOUS | Status: AC
  Administered 2012-05-06: 10 meq via INTRAVENOUS

## 2012-05-06 NOTE — Progress Notes (Signed)
INFECTIOUS DISEASE ATTENDING ADDENDUM:     [facility]   Date: 05/06/2012  Patient name: [name]  Medical record number: [id number]  Date of birth: 30-Apr-1961    This patient has been seen and discussed with the house staff. Please see their note for complete details. I concur with their findings with the following additions/corrections:  Paulette Blanch [place] 05/06/2012, 7:48 PM   Patient is HD stable, WBC down after drainage of abscess (unfortunately culture not sent)

## 2012-05-06 NOTE — Progress Notes (Signed)
9 Days Post-Op  Subjective: RLQ abscess drain placed 8/9 Drainage had slowed and CT showed malposition Reposition and upsized 8/14 Pt feels some better  Objective: Vital signs in last 24 hours: Temp:  [96.6 F (35.9 C)-98.2 F (36.8 C)] 96.6 F (35.9 C) (08/15 0735) Pulse Rate:  [68-122] 117  (08/15 1025) Resp:  [12-28] 19  (08/15 1025) BP: (136-204)/(77-94) 151/93 mmHg (08/15 1025) SpO2:  [90 %-100 %] 100 % (08/15 0906) Weight:  [122 lb 2.2 oz (55.4 kg)-124 lb 12.5 oz (56.6 kg)] 124 lb 12.5 oz (56.6 kg) (08/15 0735) Last BM Date:  (unable to verbalize last bm)  Intake/Output from previous day: 08/14 0701 - 08/15 0700 In: 1885 [IV Piggyback:150; TPN:1730] Out: 410 [Emesis/NG output:400; Drains:10] Intake/Output this shift:   PE: afeb; VSS Wbc 23.4 (27.5) Drain in place; output 10 cc today - greenish color Site clean and dry NT; CX: gr+ cocci and yeast   Lab Results:   Basename 05/06/12 0809 05/06/12 0450  WBC 23.4* 27.5*  HGB 11.1* 11.4*  HCT 32.7* 33.8*  PLT 172 188   BMET  Basename 05/06/12 0809 05/05/12 0635  NA 124* 132*  K 3.2* 3.4*  CL 86* 92*  CO2 20 23  GLUCOSE 116* 120*  BUN 78* 45*  CREATININE 5.69* 4.02*  CALCIUM 10.1 9.8   PT/INR No results found for this basename: LABPROT:2,INR:2 in the last 72 hours ABG No results found for this basename: PHART:2,PCO2:2,PO2:2,HCO3:2 in the last 72 hours  Studies/Results: Ct Abdomen Pelvis W Contrast  05/04/2012  *RADIOLOGY REPORT*  Clinical Data: Status post surgery to treat the small bowel obstruction.  A right lower quadrant abscess was treated with percutaneous drainage on 04/30/2012.  CT ABDOMEN AND PELVIS WITH CONTRAST  Technique:  Multidetector CT imaging of the abdomen and pelvis was performed following the standard protocol during bolus administration of intravenous contrast.  Contrast:  75 ml Omnipaque-300 IV  Comparison: 04/30/2012 and 04/29/2012  Findings: Visualized lung bases show a left basilar  pleural effusion and consolidation/atelectasis of the posterior left lower lobe.  There is extravasation of oral contrast from the small bowel into a residual abscess cavity within the right lower quadrant of the abdomen.  The percutaneous drain is located in the posterior/dependent aspect of the cavity and shows some surrounding debris which may be acting to occlude/partially occlude the catheter.  The abscess cavity remains of significant size, measuring over 10 cm in greatest dimensions.  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  Consider repositioning/upsizing of the current percutaneous drain or placement of a second drain into the more anterior aspect of the residual collection.  No new abscess collections are identified. There is no evidence of gross free intraperitoneal air.  Solid organs of the abdomen are stable in appearance.  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 Indwelling percutaneous drain lies in the dependent portion of this collection which may contain debris obstructing the catheter. Given the large residual collection present, further drain manipulation including potential repositioning and upsizing of the current drain and also potentially placement of a second more anteriorly  positioned drain may be helpful.  This will be communicated to the interventional Radiology service.  Original Report Authenticated By: Reola Calkins, M.D.   Ir Catheter Tube Change  05/05/2012  *RADIOLOGY REPORT*  Clinical history:51 year old with multiple medical problems  including a small bowel leak.  The patient has a right lower quadrant drain which is incompletely draining the large abscess collection.  PROCEDURE(S): EXCHANGE AND REPOSITIONING OF THE PERCUTANEOUS DRAINAGE CATHETER WITH FLUOROSCOPY  Physician: Rachelle Hora. Henn, MD  Medications:Versed 2 mg, Fentanyl 25 mcg. A radiology nurse monitored the patient for moderate sedation.  Moderate sedation time:22 minutes  Fluoroscopy time: 3.8 minutes  Procedure:Informed consent was obtained for a drain repositioning and exchange.  The patient was placed supine on the interventional table.  The existing catheter in the right lower quadrant was prepped and draped in a sterile fashion.  Maximal barrier sterile technique was utilized including caps, mask, sterile gowns, sterile gloves, sterile drape, hand hygiene and skin antiseptic.  Catheter was injected with contrast and the catheter was cut and removed over a Bentson wire.  A Kumpe catheter was advanced into the upper portion of the collection.  Position was confirmed with ultrasound. A 12-French biliary drain was advanced over a Bentson wire and into the superior aspect the collection.  Greater than 200 ml of dark brown fluid was removed.  Catheter sutured to the skin and attached to a gravity bag.  Findings:The old catheter was located within the inferior posterior aspect of the abscess collection.  Adjacent to the collection, there is high density material which is probably related to the small bowel fistula or leak.  Catheter and wire were advanced into the anterior and superior aspect of the collection.  Biliary drain was also advanced into the superior aspect of the collection. Greater than 200 ml of dark brown fluid was removed.  Complications: None  Impression:Successful exchange and repositioning of the drainage catheter.  The large abscess was successfully decompressed following placement of the new catheter.  Original Report Authenticated By: Richarda Overlie, M.D.   Dg Chest  Port 1 View  05/05/2012  *RADIOLOGY REPORT*  Clinical Data: Chest pain.  PORTABLE CHEST - 1 VIEW  Comparison: 04/28/2012  Findings: Nasogastric tube extends at least as   the   stomach, tip not seen.  Left lower lung consolidation / atelectasis has partially improved since the previous exam.  Right lung remains clear.  Probable small left pleural effusion.  Regional bones unremarkable.  IMPRESSION:  1.  Slight improvement in the left lower lung consolidation / atelectasis, with persistent small effusion.  Original Report Authenticated By: Osa Craver, M.D.    Anti-infectives:   Assessment/Plan: s/p Procedure(s) (LRB): THROMBECTOMY ARTERIOVENOUS GORE-TEX GRAFT (Left) REVISION OF ARTERIOVENOUS GORETEX GRAFT (Left)  abd abscess drain placed 5/9 upsized 8/14 Some better Will follow  Marcee Jacobs A 05/06/2012

## 2012-05-06 NOTE — Progress Notes (Signed)
Pt resting quietly, no complaints at this time. Pt blood pressure 172/83, HR 99. Will continue to monitor pt. Adria Dill RN

## 2012-05-06 NOTE — Progress Notes (Signed)
Subjective: 2100: Notified by RN pt c/o SSCP that was non-radiating and not associated w/ other sx's. BP and HR elevated above baseline. Pt reported pain 6-7/10 and not relived by morphine IV. Pt was given NTG 0.4mg  SL q71min x 3 w/o relief. Upon my arrival to bedside pt reports some improvement in pain. Pt states he sometimes gets gas trapped in his stomach that he can not get out and this causes terrible pain. Admits this pain is similar but it usually does not last this long. Objective: Upon my arrival to bedside pt noted resting in bed in no acute distress. BP 192/89, P-114, R-22 w/ 02 sats of 98% on 2L [place]. BBS CTA, HR w/ RRR and w/o M/G/R. 2+ pe noted to LLE. EKG shows ST w/ rate of 120 and occassional PAC's but w/o acute findings. CXR shows  IMPRESSION:  1. Slight improvement in the left lower lung consolidation /  atelectasis, with persistent small effusion.  Original Report Authenticated By: Thora Lance III, M.D While still at bedside BP and HR improved after IV Metoprolol. Cardiac panel pending. Assessment/Plan: 1. Atypical chest pain: Source unclear. Pt has had similar episodes during this hospitalization w/ persistently elevated troponins that have been felt due to demand ischemia vs recent stressors of recurrent surgical procedures. Pain not improved w/ SL NTG and pt admits could have GI source. Pain did seem to improve w/ second dose of morphine and IV Metoprolol. Discussed pt w/ Dr Toniann Fail who has also reviewed EKG. Will repeat one set of cardiac enzymes. If c/w previous elevations will not cycle. Will cycle if significant elevations. Will continue to monitor closely.

## 2012-05-06 NOTE — Progress Notes (Signed)
INFECTIOUS DISEASE ATTENDING ADDENDUM:     [facility]   Date: 05/06/2012  Patient name: [name]  Medical record number: [id number]  Date of birth: August 05, 1961    This patient has been seen and discussed with the house staff. Please see their note for complete details. I concur with their findings with the following additions/corrections:  Patient had drain repositioned. His WBC has come down and his bp is stable during HD.  I would NOT add back vancomycin at this point from coverage standpoint  I would ask lab to speciate the candida and send for sensitivities (it will be sensitive to mycafungin regardless)  If he has a new drain placed he should have cultures sent from the freshly drained abscess  If his IV access becomes problematic we could try change to abx dosed with HD plus oral abx to cover him as broadly as we are doing at present  [employer] 05/06/2012, 11:16 AM

## 2012-05-06 NOTE — Progress Notes (Signed)
Subjective:  Drain repositioned per IR yesterday. Stable, no new complaints  Objective Filed Vitals:   05/06/12 1025 05/06/12 1100 05/06/12 1130 05/06/12 1153  BP: 151/93 160/90 140/86 153/92  Pulse: 117 113 111 110  Temp:      TempSrc:      Resp: 19 18 14 14   Height:      Weight:      SpO2:       Physical Exam General: comfortable, NG in place Heart: RRR Lungs: mostly clear Abdomen: distended, slightly painful.  Minimal bowel sounds. Drain in RLQ Extremities: L leg 2+ asymmetric edema throughout leg, lower>upper. No edema RLE Dialysis Access: left thigh AVGG patent  Dialysis Orders: Center: [facility] on TTS.  EDW 52.5 kg HD Bath 2K/2Ca Time 3.5 hrs Heparin 5000 U. Access AVG @ left thigh BFR 400 DFR 800 Zemplar 0 mcg IV/HD Epogen 0 Units IV/HD Venofer 50/week; last iPTH 314, but product high and no zemplar   Assessment/Plan:  1. SBO, s/p exp lap/SB resection 7/24 Dr. Luisa Hart, complicated by dehiscence with peritonitis, back to OR 8/1 for further SB resection and reanastomosis; Now s/p perc drainage of intraabd abcesses on 8/8. On IV zosyn and mycofungin.   Still with ileus/NGT per surgery. Also needing hydrotherapy to open wound. CT repeated, showed persistent fluid, IR planning to possibly revise the drain.  2. ESRD - HD TTS at [facility]- Did daily HD last week for volume, but not needed now. Temp R groin cath still in place, they are using for IV access; thigh graft s/p declot is functioning ok.  Did not tolerate UF with HD 8/13. Goal is 1-2 kg with HD today. Bed recalibrated for trapeze and weights more accurate now. 3. Hypertension/volume - was on no BP meds at home. BP has been variable here.  Conidine stopped and hemodynamics seem improved.  Keep IV metoprolol for now.   Steroids being weaned. 4. Anemia of CKD - Was not on EPO prior to admission, but getting max Aranesp here. Hgb 11's and rising.  Will reduce Aranesp to 100/wk and hold if Hgb > 11.5.  TNA has probably helped with BM  production. On full course of IV Fe thru 8/20; ferritin 552. PRN transfusions, received 2 units last wk on 8/8 and 8/9. 5. Metabolic bone disease - No binders due to low phos being repleted.  Phos now within normal limits 3-4 range.  6. 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  Asymmetric left leg edema may be related to this and to presence of L thigh access.  7. Hypercalcemia- adjusted Ca over 11, due to immobility. Will decrease Ca++ in bath. 8. Protein calorie malnutrition- on TNA 70cc/hr, getting lipids MWF due to shortage 9. S/p renal transplant - at [facility] in '87, started HD in 04/2003, remains on Prednisone. 10. Hepatitis C/thrombocytopenia - stable  Vinson Moselle  MD [facility] [phone] pgr    [phone] cell 05/06/2012, 11:55 AM      Additional Objective Labs: Basic Metabolic Panel:  Lab 05/06/12 0865 05/06/12 0450 05/05/12 0635 05/04/12 0345  NA 124* -- 132* 134*  K 3.2* -- 3.4* 4.0  CL 86* -- 92* 97  CO2 20 -- 23 25  GLUCOSE 116* -- 120* 141*  BUN 78* -- 45* 45*  CREATININE 5.69* -- 4.02* 4.02*  CALCIUM 10.1 -- 9.8 9.9  ALB -- -- -- --  PHOS 3.6 3.6 3.0 --   Liver Function Tests:  Lab 05/06/12  0809 05/03/12 0623 05/01/12 1002  AST -- 21 --  ALT -- 23 --  ALKPHOS -- 119* --  BILITOT -- 1.5* --  PROT -- 5.4* --  ALBUMIN 1.7* 1.5* 1.5*   CBC:  Lab 05/06/12 0809 05/06/12 0450 05/05/12 0635 05/04/12 0800 05/03/12 0623 05/03/12 0535 04/30/12 0500  WBC 23.4* 27.5* 26.4* -- -- -- --  NEUTROABS -- -- -- -- -- 23.2* 19.1*  HGB 11.1* 11.4* 11.3* -- -- -- --  HCT 32.7* 33.8* 34.6* -- -- -- --  MCV 84.1 85.1 88.9 85.2 84.0 -- --  PLT 172 188 166 -- -- -- --  Blood Culture    Component Value Date/Time   SDES ABSCESS ABDOMEN 04/30/2012 1715   SDES ABSCESS ABDOMEN 04/30/2012 1715   SDES ABSCESS ABDOMEN 04/30/2012 1715   SPECREQUEST 4 SYRINGES @ 60CC 04/30/2012 1715   SPECREQUEST 4 SYRINGES @ 60CC 04/30/2012 1715   SPECREQUEST  4 SYRINGES @ 60CC 04/30/2012 1715   CULT NO ANAEROBES ISOLATED 04/30/2012 1715   CULT YEAST ISOLATED;ID TO FOLLOW 04/30/2012 1715   CULT FEW YEAST CONSISTENT WITH CANDIDA SPECIES 04/30/2012 1715   REPTSTATUS 05/05/2012 FINAL 04/30/2012 1715   REPTSTATUS PENDING 04/30/2012 1715   REPTSTATUS 05/04/2012 FINAL 04/30/2012 1715  Cardiac Enzymes:  Lab 05/05/12 2300  CKTOTAL <7*  CKMB 1.6  CKMBINDEX --  TROPONINI 1.76*  CBG:  Lab 05/06/12 0537 05/06/12 0039 05/05/12 2015 05/05/12 1639 05/05/12 1125  GLUCAP 100* 184* 137* 129* 146*  Medications:    . fat emulsion 240 mL (05/05/12 1750)  . TPN (CLINIMIX) +/- additives 70 mL/hr at 05/04/12 1800  . TPN (CLINIMIX) +/- additives 70 mL/hr at 05/05/12 1749  . TPN (CLINIMIX) +/- additives        . antiseptic oral rinse  15 mL Mouth Rinse q12n4p  . chlorhexidine  15 mL Mouth Rinse BID  . darbepoetin (ARANESP) injection - DIALYSIS  200 mcg Intravenous Q Sat-HD  . fentaNYL  37.5 mcg Transdermal Q72H  . fentaNYL      . ferric gluconate (FERRLECIT/NULECIT) IV  125 mg Intravenous Q T,Th,Sa-HD  . hydrocortisone sod succinate (SOLU-CORTEF) injection  25 mg Intravenous Q12H  . insulin aspart  0-15 Units Subcutaneous Q4H  . lip balm   Topical BID  . metoprolol  5 mg Intravenous Q6H  . metoprolol  5 mg Intravenous Once  . micafungin [facility]) IV  100 mg Intravenous Daily  . midazolam      . nitroGLYCERIN      . pantoprazole (PROTONIX) IV  40 mg Intravenous Q24H  . piperacillin-tazobactam (ZOSYN)  IV  2.25 g Intravenous Q8H  . potassium chloride  10 mEq Intravenous Q1 Hr x 3  . sodium chloride  10-40 mL Intracatheter Q12H

## 2012-05-06 NOTE — Progress Notes (Signed)
Nutrition Follow-up  Intervention:   1. Continue TPN per pharmacy 2. RD to continue to follow nutrition care plan   Assessment:   Repeat CT reveals enlarging abscess and possible enteric fistula and obstructed drainage tube. Underwent repositioning and upsize of abscess drain yesterday.  Patient continues to receive TPN with Clinimix 5/15 @ 70 ml/hr. Lipids (20% IVFE @ 10 ml/hr), multivitamins, and trace elements are provided 3 times weekly (MWF) due to national backorder. Provides 1400 kcal and 84 grams protein daily (based on weekly average). Meets 82% minimum estimated kcal and 100% minimum estimated protein needs.  Diet Order:  NPO  Meds: Scheduled Meds:    . antiseptic oral rinse  15 mL Mouth Rinse q12n4p  . chlorhexidine  15 mL Mouth Rinse BID  . darbepoetin (ARANESP) injection - DIALYSIS  200 mcg Intravenous Q Sat-HD  . fentaNYL  37.5 mcg Transdermal Q72H  . fentaNYL      . ferric gluconate (FERRLECIT/NULECIT) IV  125 mg Intravenous Q T,Th,Sa-HD  . hydrocortisone sod succinate (SOLU-CORTEF) injection  25 mg Intravenous Q12H  . insulin aspart  0-15 Units Subcutaneous Q4H  . lip balm   Topical BID  . metoprolol  5 mg Intravenous Q6H  . metoprolol  5 mg Intravenous Once  . micafungin [facility]) IV  100 mg Intravenous Daily  . midazolam      . nitroGLYCERIN      . pantoprazole (PROTONIX) IV  40 mg Intravenous Q24H  . piperacillin-tazobactam (ZOSYN)  IV  2.25 g Intravenous Q8H  . potassium chloride  10 mEq Intravenous Q1 Hr x 3  . sodium chloride  10-40 mL Intracatheter Q12H   Continuous Infusions:    . fat emulsion 240 mL (05/05/12 1750)  . TPN (CLINIMIX) +/- additives 70 mL/hr at 05/04/12 1800  . TPN (CLINIMIX) +/- additives 70 mL/hr at 05/05/12 1749  . TPN (CLINIMIX) +/- additives     PRN Meds:.acetaminophen, acetaminophen, albuterol, heparin, heparin, heparin, hydrALAZINE, iohexol, morphine injection, nitroGLYCERIN, ondansetron (ZOFRAN) IV, ondansetron, promethazine,  sodium chloride  Labs:  CMP     Component Value Date/Time   NA 124* 05/06/2012 0809   K 3.2* 05/06/2012 0809   CL 86* 05/06/2012 0809   CO2 20 05/06/2012 0809   GLUCOSE 116* 05/06/2012 0809   BUN 78* 05/06/2012 0809   CREATININE 5.69* 05/06/2012 0809   CALCIUM 10.1 05/06/2012 0809   PROT 5.4* 05/03/2012 0623   ALBUMIN 1.7* 05/06/2012 0809   AST 21 05/03/2012 0623   ALT 23 05/03/2012 0623   ALKPHOS 119* 05/03/2012 0623   BILITOT 1.5* 05/03/2012 0623   GFRNONAA 10* 05/06/2012 0809   GFRAA 12* 05/06/2012 0809   Phosphorus  Date/Time Value Range Status  05/06/2012  8:09 AM 3.6  2.3 - 4.6 mg/dL Final  12/29/8117  1:47 AM 3.6  2.3 - 4.6 mg/dL Final  05/20/5620  3:08 AM 3.0  2.3 - 4.6 mg/dL Final   Potassium  Date/Time Value Range Status  05/06/2012  8:09 AM 3.2* 3.5 - 5.1 mEq/L Final  05/05/2012  6:35 AM 3.4* 3.5 - 5.1 mEq/L Final  05/04/2012  3:45 AM 4.0  3.5 - 5.1 mEq/L Final     DELTA CHECK NOTED   Magnesium  Date/Time Value Range Status  05/06/2012  4:50 AM 1.6  1.5 - 2.5 mg/dL Final  6/57/8469  6:29 AM 1.7  1.5 - 2.5 mg/dL Final  01/22/8412  2:44 AM 1.6  1.5 - 2.5 mg/dL Final   Prealbumin  Date/Time Value Range  Status  05/03/2012  5:35 AM 25.1  17.0 - 34.0 mg/dL Final  4/0/9811  9:14 AM 7.7* 17.0 - 34.0 mg/dL Final  7/82/9562  1:30 AM 9.5* 17.0 - 34.0 mg/dL Final    Intake/Output Summary (Last 24 hours) at 05/06/12 1123 Last data filed at 05/06/12 0600  Gross per 24 hour  Intake   1605 ml  Output    410 ml  Net   1195 ml   Weight Status:  63.8 kg s/p HD on 8/12 - stabilizing; overall gain 65.7 kg s/p HD on 8/8  50.5 kg s/p HD on 8/6 50 kg s/p HD on 8/5 47.8 kg s/p HD on 7/30  48.3 kg s/p HD on 7/27  Body mass index is 17.40 kg/(m^2). Weight is WNL.  Estimated needs:  [calculated using the Kidney Disease Outcomes Quality Initiative (KDOQI) Adjustment Equation for the Underweight Patient]: 1700 - 1950 kcal, 80 - 100 grams protein daily  Nutrition Dx:  Inadequate oral intake now  R/T GI distress AEB pt report. Ongoing.  Goal:  Initiation of TPN; intake to meet at least 90% of estimated needs. Unmet.  Monitor:  Diet advancement, TPN adequacy, weights, labs, I/O's  Jarold Motto MS, RD, LDN Pager: [phone] After-hours pager: [phone]

## 2012-05-06 NOTE — Progress Notes (Signed)
[facility] PROGRESS NOTE  [name] ION:[id number] DOB: Oct 24, 1960 DOA: 04/02/2012 PCP: Trevor Iha, MD  Assessment/Plan: SBO (small bowel obstruction) s/p lysis of adhesions and partial small bowel resection 7/24 *Subsequent anastomosis dehiscence / SBO s/p lysis of adhesions, small bowel resection, omental patch of anastomosis 8/2  *CT abdomen and pelvis 04/29/2012 demonstrated several fluid collections consistent with intra-abdominal abscess with the largest measuring 16.5 x 13.7 x 6.4 cm.  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 *An attempt was made on 04/21/2012 to place a central line in interventional radiology under fluoroscopy but unfortunately central venous access could not be placed due to upper extremity and internal jugular vein access being chronically occluded bilaterally therefore rationale for why we unfortunately have to continue to use this patient's right femoral Vas-Cath port for parenteral nutrition  Chronic nonocclusive left lower extremity DVT Diagnosed via Doppler 04/30/2012  Ileus following gastrointestinal surgery *Cont TNA via right femoral Vas-Cath port per surgery  Atypical chest pain/abnormal cardiac enzyme *Having episodes of severe chest pain during this hospitalization. CT of the chest revealed no evidence of pulmonary embolism or aortic dissection *Suspect mildly elevated cardiac  isoenzymes related to demand ischemia versus recent stressors of recurrent surgical procedures  CKD (chronic kidney disease) stage V requiring chronic dialysis *Dialysis per nephrology  Hypokalemia/hypophosphatemia/hyponatremia *Management per nephrology service/TNA  Thrombocytopenia *Resolved  Normocytic Anemia  *Stable. Due to acute blood loss and due to chronic disease *Received packed red blood cells on 04/25/2012 *Cont aranesp and ferric gluconate  HTN (hypertension) Fair control  Chronic use of steroids On very slow steroid taper  Malnutrition, calorie *Patient cachectic at presentation  *Now with acute malnutrition related to postoperative ileus and surgical complications *Parenteral nutrition managed by surgery and pharmacy  Leg dialysis graft occlusion *s/p thrombectomy 7/25 - unfortunately re clotted-has undergone an additional thrombectomy procedure with revision of arterial and graft on 04/27/2012 *Currently being dialyzed via groin HD catheter- VVS following  DVT prophylaxis: SCDs Code Status: full  Disposition Plan: Remain in step down  Consultants: [facility] [place] Infectious disease  Events since admission: 7/13 CT Abdomen with SBO  7/24 Ex-lap, lysis of adhesions, partial small bowel resection x2  7/25 Thrombectomy L femoral AV graft  7/26 Extubated, CVL is out  7/27 Transferred to telemetry under TRH  8/2 Acute abdomen, dehiscence of anastomosis, SBO, back from OR intubated in ICU on PCCM service. Found to have moderate peritonitis infraumbilically 8/2 Extubated  Antibiotics: Zosyn 8/1 >>> Micafungin 8/3 >> Invanz 7/24- periop Fluconazole- 8/2- one dose Vancomycin 8/8 >>> 05/01/2012  HPI/Subjective: Feels ok. No complaints.  Objective: Filed Vitals:   05/06/12 1153 05/06/12 1216 05/06/12 1300 05/06/12 1629  BP: 153/92 141/84 157/76 139/69  Pulse: 110 103 97 82  Temp:  97 F (36.1 C) 97.5 F (36.4 C) 97.6 F (36.4 C)    TempSrc:  Oral Oral Oral  Resp: 14 15 19 16   Height:      Weight:  53.1 kg (117 lb 1 oz)    SpO2:  100% 100% 100%  Intake/Output Summary (Last 24 hours) at 05/06/12 1826 Last data filed at 05/06/12 1700  Gross per 24 hour  Intake   1680 ml  Output   2170 ml  Net   -490 ml    Exam:   General:  Alert, appears calm, in no acute distress  Cardiovascular: RRR, no m/r/g. 2+ LLE edeam  Respiratory: CTA bilaterally, no w/r/r. Normal respiratory effort.  Data Reviewed: Basic Metabolic Panel:  Lab 05/06/12 6440 05/06/12 0450 05/05/12 3474 05/04/12 0345 05/03/12 0623 05/02/12 0452 05/01/12 1002  NA 124* -- 132* 134* 128* 132* --  K 3.2* -- 3.4* 4.0 3.3* 3.5 --  CL 86* -- 92* 97 92* 96 --  CO2 20 -- 23 25 20 23  --  GLUCOSE 116* -- 120* 141* 132* 154* --  BUN 78* -- 45* 45* 76* 45* --  CREATININE 5.69* -- 4.02* 4.02* 5.67* 3.61* --  CALCIUM 10.1 -- 9.8 9.9 9.8 9.3 --  MG -- 1.6 -- 1.7 -- -- --  PHOS 3.6 3.6 3.0 2.0* -- -- 2.7   Liver Function Tests:  Lab 05/06/12 0809 05/03/12 0623 05/01/12 1002 04/30/12 0724  AST -- 21 -- --  ALT -- 23 -- --  ALKPHOS -- 119* -- --  BILITOT -- 1.5* -- --  PROT -- 5.4* -- --  ALBUMIN 1.7* 1.5* 1.5* 1.6*   CBC:  Lab 05/06/12 0809 05/06/12 0450 05/05/12 0635 05/04/12 0800 05/03/12 0623 05/03/12 0535 04/30/12 0500  WBC 23.4* 27.5* 26.4* 49.6* 27.7* -- --  NEUTROABS -- -- -- -- -- 23.2* 19.1*  HGB 11.1* 11.4* 11.3* 12.0* 10.3* -- --  HCT 32.7* 33.8* 34.6* 35.7* 30.5* -- --  MCV 84.1 85.1 88.9 85.2 84.0 -- --  PLT 172 188 166 181 176 -- --   CBG:  Lab 05/06/12 1559 05/06/12 1249 05/06/12 0537 05/06/12 0039 05/05/12 2015  GLUCAP 95 131* 100* 184* 137*    Recent Results (from the past 240 hour(s))  CULTURE, BLOOD (ROUTINE X 2)     Status: Normal   Collection Time   04/28/12  3:38 PM      Component Value Range Status Comment   Specimen Description BLOOD RIGHT HAND   Final    Special Requests BOTTLES DRAWN AEROBIC ONLY 5CC   Final     Culture  Setup Time 04/28/2012 22:13   Final    Culture NO GROWTH 5 DAYS   Final    Report Status 05/04/2012 FINAL   Final   CULTURE, BLOOD (ROUTINE X 2)     Status: Normal   Collection Time   04/28/12  3:48 PM      Component Value Range Status Comment   Specimen Description BLOOD RIGHT ARM   Final    Special Requests BOTTLES DRAWN AEROBIC ONLY 5CC   Final    Culture  Setup Time 04/28/2012 22:12   Final    Culture NO GROWTH 5 DAYS   Final    Report Status 05/04/2012 FINAL   Final   ANAEROBIC CULTURE     Status: Normal   Collection Time   04/30/12  5:15 PM      Component Value Range Status Comment   Specimen Description ABSCESS ABDOMEN   Final    Special Requests 4 SYRINGES @ 60CC   Final    Gram Stain     Final    Value: ABUNDANT WBC PRESENT, PREDOMINANTLY PMN     NO SQUAMOUS EPITHELIAL CELLS SEEN     ABUNDANT GRAM  POSITIVE COCCI IN PAIRS     IN CLUSTERS   Culture NO ANAEROBES ISOLATED   Final    Report Status 05/05/2012 FINAL   Final   FUNGUS CULTURE W SMEAR     Status: Normal (Preliminary result)   Collection Time   04/30/12  5:15 PM      Component Value Range Status Comment   Specimen Description ABSCESS ABDOMEN   Final    Special Requests 4 SYRINGES @ 60CC   Final    Fungal Smear NO YEAST OR FUNGAL ELEMENTS SEEN   Final    Culture YEAST ISOLATED;ID TO FOLLOW   Final    Report Status PENDING   Incomplete   CULTURE, ROUTINE-ABSCESS     Status: Normal   Collection Time   04/30/12  5:15 PM      Component Value Range Status Comment   Specimen Description ABSCESS ABDOMEN   Final    Special Requests 4 SYRINGES @ 60CC   Final    Gram Stain     Final    Value: ABUNDANT WBC PRESENT, PREDOMINANTLY PMN     NO SQUAMOUS EPITHELIAL CELLS SEEN     ABUNDANT GRAM POSITIVE COCCI IN PAIRS     IN CLUSTERS   Culture FEW YEAST CONSISTENT WITH CANDIDA SPECIES   Final    Report Status 05/04/2012 FINAL   Final     Scheduled Meds: reviewed Continuous Infusions:  reviewed _________________________________________________________________  05/06/2012, 6:26 PM  LOS: 34 days    Calvert Cantor, MD [facility] Team 1 [phone]

## 2012-05-06 NOTE — Progress Notes (Signed)
[facility]    Date of Admission:  04/02/2012     Total days of antibiotics 15  Zosyn Day 15  Micafungin Day 13   Principal Problem:  *SBO (small bowel obstruction) s/p EL/LOA and SBR x 2 Active Problems:  CKD (chronic kidney disease) stage V requiring chronic dialysis  Erythrocytosis  Thrombocytopenia  Gout  HTN (hypertension)  Ileus following gastrointestinal surgery  Chronic use of steroids  Malnutrition, calorie  Leg graft occlusion  Acute respiratory failure with hypoxia  Sinus tachycardia  Abscess of abdominal cavity      . antiseptic oral rinse  15 mL Mouth Rinse q12n4p  . chlorhexidine  15 mL Mouth Rinse BID  . darbepoetin (ARANESP) injection - DIALYSIS  200 mcg Intravenous Q Sat-HD  . fentaNYL  37.5 mcg Transdermal Q72H  . fentaNYL      . ferric gluconate (FERRLECIT/NULECIT) IV  125 mg Intravenous Q T,Th,Sa-HD  . hydrocortisone sod succinate (SOLU-CORTEF) injection  25 mg Intravenous Q12H  . insulin aspart  0-15 Units Subcutaneous Q4H  . lip balm   Topical BID  . metoprolol  5 mg Intravenous Q6H  . metoprolol  5 mg Intravenous Once  . micafungin [facility]) IV  100 mg Intravenous Daily  . midazolam      . nitroGLYCERIN      . pantoprazole (PROTONIX) IV  40 mg Intravenous Q24H  . piperacillin-tazobactam (ZOSYN)  IV  2.25 g Intravenous Q8H  . sodium chloride  10-40 mL Intracatheter Q12H    Subjective: Pt had a hypotensive event last night. Currently patient is stable and complaining of abdominal pain. No other complaints.   Objective: Temp:  [96.6 F (35.9 C)-98.2 F (36.8 C)] 96.6 F (35.9 C) (08/15 0735) Pulse Rate:  [68-122] 82  (08/15 0805) Resp:  [12-26] 16  (08/15 0828) BP: (150-204)/(64-94) 166/85 mmHg (08/15 0828) SpO2:  [90 %-100 %] 100 % (08/15 0735) Weight:  [55.4 kg (122 lb 2.2 oz)-56.6 kg (124 lb 12.5 oz)] 56.6 kg (124 lb 12.5 oz) (08/15 0735)  General: oriented x 3, NAD, lethargic  HEENT: PERRL, EOMI,  scleral icterus  Cardiac: RRR, no rubs, murmurs or gallops  Pulm: clear to auscultation bilaterally, moving normal volumes of air  Abd: soft, tender, nondistended, BS minimal  Ext: warm and well perfused, no pedal edema, dry skin  Neuro: alert and oriented X3, cranial nerves II-XII grossly intact   Lab Results Lab Results  Component Value Date   WBC 23.4* 05/06/2012   HGB 11.1* 05/06/2012   HCT 32.7* 05/06/2012   MCV 84.1 05/06/2012   PLT 172 05/06/2012    Lab Results  Component Value Date   CREATININE 4.02* 05/05/2012   BUN 45* 05/05/2012   NA 132* 05/05/2012   K 3.4* 05/05/2012   CL 92* 05/05/2012   CO2 23 05/05/2012    Lab Results  Component Value Date   ALT 23 05/03/2012   AST 21 05/03/2012   ALKPHOS 119* 05/03/2012   BILITOT 1.5* 05/03/2012      Microbiology: Recent Results (from the past 240 hour(s))  CULTURE, BLOOD (ROUTINE X 2)     Status: Normal   Collection Time   04/28/12  3:38 PM      Component Value Range Status Comment   Specimen Description BLOOD RIGHT HAND   Final    Special Requests BOTTLES DRAWN AEROBIC ONLY 5CC   Final    Culture  Setup Time 04/28/2012 22:13   Final  Culture NO GROWTH 5 DAYS   Final    Report Status 05/04/2012 FINAL   Final   CULTURE, BLOOD (ROUTINE X 2)     Status: Normal   Collection Time   04/28/12  3:48 PM      Component Value Range Status Comment   Specimen Description BLOOD RIGHT ARM   Final    Special Requests BOTTLES DRAWN AEROBIC ONLY 5CC   Final    Culture  Setup Time 04/28/2012 22:12   Final    Culture NO GROWTH 5 DAYS   Final    Report Status 05/04/2012 FINAL   Final   ANAEROBIC CULTURE     Status: Normal   Collection Time   04/30/12  5:15 PM      Component Value Range Status Comment   Specimen Description ABSCESS ABDOMEN   Final    Special Requests 4 SYRINGES @ 60CC   Final    Gram Stain     Final    Value: ABUNDANT WBC PRESENT, PREDOMINANTLY PMN     NO SQUAMOUS EPITHELIAL CELLS SEEN     ABUNDANT GRAM POSITIVE COCCI IN PAIRS      IN CLUSTERS   Culture NO ANAEROBES ISOLATED   Final    Report Status 05/05/2012 FINAL   Final   FUNGUS CULTURE W SMEAR     Status: Normal (Preliminary result)   Collection Time   04/30/12  5:15 PM      Component Value Range Status Comment   Specimen Description ABSCESS ABDOMEN   Final    Special Requests 4 SYRINGES @ 60CC   Final    Fungal Smear NO YEAST OR FUNGAL ELEMENTS SEEN   Final    Culture YEAST ISOLATED;ID TO FOLLOW   Final    Report Status PENDING   Incomplete   CULTURE, ROUTINE-ABSCESS     Status: Normal   Collection Time   04/30/12  5:15 PM      Component Value Range Status Comment   Specimen Description ABSCESS ABDOMEN   Final    Special Requests 4 SYRINGES @ 60CC   Final    Gram Stain     Final    Value: ABUNDANT WBC PRESENT, PREDOMINANTLY PMN     NO SQUAMOUS EPITHELIAL CELLS SEEN     ABUNDANT GRAM POSITIVE COCCI IN PAIRS     IN CLUSTERS   Culture FEW YEAST CONSISTENT WITH CANDIDA SPECIES   Final    Report Status 05/04/2012 FINAL   Final     Studies/Results: Ct Abdomen Pelvis W Contrast  05/04/2012  *RADIOLOGY REPORT*  Clinical Data: Status post surgery to treat the small bowel obstruction.  A right lower quadrant abscess was treated with percutaneous drainage on 04/30/2012.  CT ABDOMEN AND PELVIS WITH CONTRAST  Technique:  Multidetector CT imaging of the abdomen and pelvis was performed following the standard protocol during bolus administration of intravenous contrast.  Contrast:  75 ml Omnipaque-300 IV  Comparison: 04/30/2012 and 04/29/2012  Findings: Visualized lung bases show a left basilar pleural effusion and consolidation/atelectasis of the posterior left lower lobe.  There is extravasation of oral contrast from the small bowel into a residual abscess cavity within the right lower quadrant of the abdomen.  The percutaneous drain is located in the posterior/dependent aspect of the cavity and shows some surrounding debris which may be acting to occlude/partially  occlude the catheter.  The abscess cavity remains of significant size, measuring over 10 cm in greatest dimensions.  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  Consider repositioning/upsizing of the current percutaneous drain or placement of a second drain into the more anterior aspect of the residual collection.  No new abscess collections are identified. There is no evidence of gross free intraperitoneal air.  Solid organs of the abdomen are stable in appearance.  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 Indwelling percutaneous drain lies in the dependent portion of this collection which may contain debris obstructing the catheter. Given the large residual collection present, further drain manipulation including potential repositioning and upsizing of the current drain and also potentially placement of a second more anteriorly positioned drain may be helpful.  This will be communicated to the interventional Radiology service.  Original Report Authenticated By: Reola Calkins, M.D.   Ir Catheter Tube Change  05/05/2012  *RADIOLOGY REPORT*  Clinical history:51 year old with multiple medical problems including a small bowel leak.  The patient has a right lower quadrant drain which is incompletely draining the large abscess collection.  PROCEDURE(S): EXCHANGE AND REPOSITIONING OF THE PERCUTANEOUS DRAINAGE CATHETER WITH FLUOROSCOPY  Physician: Rachelle Hora. Henn, MD  Medications:Versed 2 mg, Fentanyl 25 mcg. A radiology nurse monitored the patient for moderate sedation.  Moderate sedation  time:22 minutes  Fluoroscopy time: 3.8 minutes  Procedure:Informed consent was obtained for a drain repositioning and exchange.  The patient was placed supine on the interventional table.  The existing catheter in the right lower quadrant was prepped and draped in a sterile fashion.  Maximal barrier sterile technique was utilized including caps, mask, sterile gowns, sterile gloves, sterile drape, hand hygiene and skin antiseptic.  Catheter was injected with contrast and the catheter was cut and removed over a Bentson wire.  A Kumpe catheter was advanced into the upper portion of the collection.  Position was confirmed with ultrasound. A 12-French biliary drain was advanced over a Bentson wire and into the superior aspect the collection.  Greater than 200 ml of dark brown fluid was removed.  Catheter sutured to the skin and attached to a gravity bag.  Findings:The old catheter was located within the inferior posterior aspect of the abscess collection.  Adjacent to the collection, there is high density material which is probably related to the small bowel fistula or leak.  Catheter and wire were advanced into the anterior and superior aspect of the collection.  Biliary drain was also advanced into the superior aspect of the collection. Greater than 200 ml of dark brown fluid was removed.  Complications: None  Impression:Successful exchange and repositioning of the drainage catheter.  The large abscess was successfully decompressed following placement of the new catheter.  Original Report Authenticated By: Richarda Overlie, M.D.   Dg Chest Port 1 View  05/05/2012  *RADIOLOGY REPORT*  Clinical Data: Chest pain.  PORTABLE CHEST - 1 VIEW  Comparison: 04/28/2012  Findings: Nasogastric tube extends at least as   the   stomach, tip not seen.  Left lower lung consolidation / atelectasis has partially improved since the previous exam.  Right lung remains clear.  Probable small left pleural effusion.  Regional bones unremarkable.  IMPRESSION:  1.  Slight improvement in the left lower lung consolidation / atelectasis, with persistent small effusion.  Original Report Authenticated By: Osa Craver, M.D.    Assessment/Plan: Pt continues to have increased WBC count despite antibiotic treatment though it has decreased over the past 2 days and today WBC count at 23.4. Abdominal CT was done on 8/13 showed a large residual abscess and cutaneous fistula from the abscess cavity. Radiology performed repositioning and abscess decompression with placement of new catheter on 05/05/12.   There was a thought that Pt had a C. Diff infection causing his very high WBC count, but this is unlikely as pt has no complaints of diarrhea and is not currently producing any stool as he is receiving TNA.   Lab was called to confirm whether or not any culture growth was seen. They confirmed that only small amounts of yeast have grown on culture despite Gram stain showing GPC.  - Continue Zosyn and Micafungin for abscess as WBC count is trending down - called lab for speciation of Candida and sensitivity to ensure proper Abx treatment. Will take 2-3 days for results.  Lenox Ahr for Infectious [facility] 05/06/2012, 8:59 AM  This is a Medical Student Note. The care of the patient was discussed with Dr. Daiva Eves and the assessment and plan was formulated with their assistance. Please see their note for official documentation of the patient encounter.

## 2012-05-06 NOTE — Procedures (Signed)
I was present at this dialysis session. I have reviewed the session itself and made appropriate changes.   Rob Cortney Mckinney, MD [facility] 05/06/2012, 10:32 AM   

## 2012-05-06 NOTE — Progress Notes (Signed)
Prognosis is poor. Continue drains and abx. Access per medical team

## 2012-05-06 NOTE — Progress Notes (Signed)
Hydrotherapy Note:   05/06/12 1433  Subjective Assessment  Subjective Pt supine without any c/o during session.  Patient and Family Stated Goals Get well.  Date of Onset 04/02/12  Prior Treatments Dressing changes  Evaluation and Treatment  Evaluation and Treatment Procedures Explained to Patient/Family Yes  Evaluation and Treatment Procedures agreed to  Wound 04/27/12 Other (Comment) Abdomen Mid Midline abdominal incisional wound  Date First Assessed/Time First Assessed: 04/27/12 1326   Wound Type: Other (Comment)  Location: Abdomen  Location Orientation: Mid  Wound Description (Comments): Midline abdominal incisional wound  Site / Wound Assessment Clean;Pink;Yellow  % Wound base Red or Granulating 60%  % Wound base Yellow 40%  Peri-wound Assessment Intact  Margins Unattacted edges (unapproximated)  Closure Sutures  Drainage Amount Minimal  Drainage Description Serosanguineous;Purulent;No odor  Non-staged Wound Description Full thickness  Treatment Hydrotherapy (Pulse lavage);Debridement (Selective);Packing (Saline gauze) (NS W to D 4x4, dry 4x4, abd, skin prep, tape)  Dressing Type Moist to dry (NS W to D 4x4, dry 4x4, abd, skin prep, tape)  Dressing Changed New  Dressing Status Clean  Hydrotherapy  Pulsed Lavage with Suction (psi) 8 psi (4 to 8 psi)  Pulsed Lavage with Suction - Normal Saline Used 1000 mL  Pulsed Lavage Tip Tip with splash shield  Pulsed lavage therapy - wound location Abdomen  Selective Debridement  Selective Debridement - Location Abdomen  Selective Debridement - Tools Used Forceps;Scissors  Selective Debridement - Tissue Removed Yellow slough.  Wound Therapy - Assess/Plan/Recommendations  Wound Therapy - Clinical Statement Pt is a 51 y/o male with an abdominal midline incisional wound.  Pt continues to benefit from hydrotherapy by decreasing necrotic tissue/increasing granulation tissue.  Spoke with surgery NP (who was in to view wound).  Wound continues  with slow healing.  Drain placed yesterday due to bile leaking.  Will continue with wet to dry dressings.  Wound Therapy - Functional Problem List Decreased skin integrity causing increased chance for infection.  Factors Delaying/Impairing Wound Healing Infection - systemic/local;Immobility;Multiple medical problems;Polypharmacy  Hydrotherapy Plan Debridement;Dressing change;Patient/family education;Pulsatile lavage with suction  Wound Therapy - Frequency 6X / week  Wound Therapy - Current Recommendations PT;OT;Surgery consult;WOC nurse  Wound Therapy - Follow Up Recommendations Home health RN  Wound Plan Pulse lavage, debridement, dressing change 6x/week.  Wound Therapy Goals - Improve the function of patient's integumentary system by progressing the wound(s) through the phases of wound healing by:  Decrease Necrotic Tissue - Progress Progressing toward goal  Increase Granulation Tissue - Progress Progressing toward goal  Improve Drainage Characteristics - Progress Progressing toward goal  Goals/treatment plan/discharge plan were made with and agreed upon by patient/family Yes  Wound Therapy - Potential for Goals Good    Pain:  No c/o throughout session.  05/06/2012 [name], PT, DPT [phone]

## 2012-05-06 NOTE — Progress Notes (Signed)
PARENTERAL NUTRITION CONSULT NOTE - FOLLOW UP  Pharmacy Consult for TPN Indication: SBO, s/p LOA/SBR/anastamosis , s/p LOA/SBR/re-anastamosis with patch repair for leak  Allergies  Allergen Reactions  . Allopurinol     REACTION: decreased platelets  . Aspirin     REACTION: unspecified    Patient Measurements: Height: 5\' 11"  (180.3 cm) Weight: 124 lb 12.5 oz (56.6 kg) IBW/kg (Calculated) : 75.3   Vital Signs: Temp: 96.6 F (35.9 C) (08/15 0735) Temp src: Oral (08/15 0735) BP: 181/93 mmHg (08/15 0805) Pulse Rate: 82  (08/15 0805) Intake/Output from previous day: 08/14 0701 - 08/15 0700 In: 1885 [IV Piggyback:150; TPN:1730] Out: 410 [Emesis/NG output:400; Drains:10] Intake/Output from this shift:    Labs:  Basename 05/06/12 0450 05/05/12 0635 05/04/12 0800  WBC 27.5* 26.4* 49.6*  HGB 11.4* 11.3* 12.0*  HCT 33.8* 34.6* 35.7*  PLT 188 166 181  APTT -- -- --  INR -- -- --     Basename 05/06/12 0450 05/05/12 0635 05/04/12 0345  NA -- 132* 134*  K -- 3.4* 4.0  CL -- 92* 97  CO2 -- 23 25  GLUCOSE -- 120* 141*  BUN -- 45* 45*  CREATININE -- 4.02* 4.02*  LABCREA -- -- --  CREAT24HRUR -- -- --  CALCIUM -- 9.8 9.9  MG 1.6 -- 1.7  PHOS 3.6 3.0 2.0*  PROT -- -- --  ALBUMIN -- -- --  AST -- -- --  ALT -- -- --  ALKPHOS -- -- --  BILITOT -- -- --  BILIDIR -- -- --  IBILI -- -- --  PREALBUMIN -- -- --  TRIG -- -- --  CHOLHDL -- -- --  CHOL -- -- --   Estimated Creatinine Clearance: 17.2 ml/min (by C-G formula based on Cr of 4.02).    Basename 05/06/12 0537 05/06/12 0039 05/05/12 2015  GLUCAP 100* 184* 137*    Insulin Requirements in the past 12 hours:  7 units SSI + 25 units insulin R in TNA  Nutritional Goals:  1700-1950 kCal, 75-85 grams of protein per day  Current Nutrition:  Clinimix 5/15 at 70 ml/hr with 20% lipids at 10 ml/hr MWF provides an average of 1399 kcal and 84g protein per day  Admit:  Admitted 7/13 with abd pain, weight loss, SBO. S/p  LOA, SBR 724 and repeat LOA, SBR with anasomotic leak repair 8/1 , thrombectomy with AVG revision 7/25 and repeat thrombectomy 8/6.  GI: Has been essentially NPO since admit, therefore at significant risk for re-feeding, but has been tolerating TPN thus far. NGT output slightly incr to 400cc/24h yesterday. No wound drainage output charted on 8/13 however it is noted that bile-colored secretion draining from lower abd incision. Fluid removed from abscess and drain upsized on 8/14 Endo:  CBGs/12h 100-184 (goal < 150).On moderate SSI q4 + 25 units insulin R in TPN. On Solu-Cortef-25mg  bid. Lytes:  Na 124 << 132-- low likely d/t volume overload, unable to tolerate UF d/t low bp, and Clinimix without supplemented lytes. Corrected Ca~11.8,slightly high despite no lytes in TNA. Phos 3.6, Ca x PO4 product <55. K 3.2. Goals for lytes with ileus are K~4, Mg~2, and Phos~3.5 Renal:  ESRD w/HD typically T/TH/Sat - currently receiving daily HD d/t fluid overload with ongoing TPN. Difficulty tolerating UF d/t low BPs. On Nulecit, Aranesp-Sat Pulm: Extubated on 8/1; 100% on RA Cards: BP elevated (BP/24h: 150-180/65-95) -- however becomes hypotensive during HD sessions and unable to tolerate UF. HR wnl-tachy (70-120). C/o CP on 8/14  unrelieved with NTG x3 doses, tropnin 1.76 -- thought to be related to demand ischemia. On scheduled lopressor and prn hydralazine. Hepatobil: LFTs, trig and chol WNL.  Alkphos and tbil are elevated. Albumin is low. Prealbumin is at goal. Neuro: GCS 15,  Pain 0-3/10. On fent patch, prn pain meds. ID: Micafugin + Zosyn for peritonitis/abd abscess -- ID considering adding Vanc but really wants new cultures. Afeb, WBC 27.5 <<26.4. Abd abscess culture: yeast. Drain repositioned and removed 200 cc of brown fluid from abscess on 8/14 -- not sent for culturing. Best Practices:  Mouthcare, PPI-IV   Plan:  1. Continue Clinimix 5/15 at 70 ml/hr, which meets protein goal but is just below kcal. 2.   Will decrease insulin back to 20 units/bag in TPN tonight 3. Give KCl 10 mEq IV x 3 runs today 4.  Provide available trace elements, MVI and lipids @10cc /hr on MWF only d/t national backorder. 5. Will f/u TNA labs  Georgina Pillion, PharmD, BCPS Clinical Pharmacist Pager: [phone] 05/06/2012 8:13 AM

## 2012-05-06 NOTE — Progress Notes (Signed)
Patient ID: [name], male   DOB: 1961-03-21, 51 y.o.   MRN: [id number] Patient ID: [name], male   DOB: Nov 26, 1960, 5 y.o.   MRN: [id number] Patient ID: [name], male   DOB: 12/01/1960, 70 y.o.   MRN: [id number] Patient ID: [name], male   DOB: 04/02/1961, 59 y.o.   MRN: [id number] 9 Days Post-Op  Subjective: In bed A/A/O  No c/o back pain at present. Had episode of CP and hypertension last pm which required two rounds of IV metoprolol an MS, thought to be atypical chest pain, with elevated troponin's secondary to demand ischemia; medicine team is monitoring. BP remains elevated this morning prior to dialysis. No c/o offered by patient at this time.  Objective: Vital signs in last 24 hours: Temp:  [97.4 F (36.3 C)-98.2 F (36.8 C)] 97.5 F (36.4 C) (08/15 0500) Pulse Rate:  [68-122] 84  (08/15 0100) Resp:  [12-26] 16  (08/15 0500) BP: (150-204)/(64-94) 166/80 mmHg (08/15 0500) SpO2:  [90 %-100 %] 100 % (08/15 0500) Weight:  [122 lb 2.2 oz (55.4 kg)] 122 lb 2.2 oz (55.4 kg) (08/15 0500) Last BM Date:  (unable to verbalize last bm)  Intake/Output from previous day: 08/14 0701 - 08/15 0700 In: 1485 [IV Piggyback:150; TPN:1330] Out: 410 [Emesis/NG output:400; Drains:10] Intake/Output this shift:    Dressing C/D/I at present no drainage seen from wound. Abdomen soft. No BS. Minimal drainage from perc drain ( 10ml for past 24 hrs recorded: however 200 ml of "dark fluid" was removed from abdomen during perc drain switch out in IR. Unfortunately no C&S was sent) NG remains in place. (400 ml recorded as output for past 24 hrs) WBC are now trending downward post IR procedure currently at 27.5 VS remain stable and he remains afebrile. Labs stable. IR Note: Findings:The old catheter was located within the inferior posterior  aspect of the abscess collection. Adjacent to the collection,  there is high density material which is probably related to the  small bowel fistula  or leak. Catheter and wire were advanced into  the anterior and superior aspect of the collection. Biliary drain  was also advanced into the superior aspect of the collection.  Greater than 200 ml of dark brown fluid was removed.   Impression:Successful exchange and repositioning of the drainage  catheter. The large abscess was successfully decompressed  following placement of the new catheter.    Lab Results:   [facility] 05/06/12 0450 05/05/12 0635  WBC 27.5* 26.4*  HGB 11.4* 11.3*  HCT 33.8* 34.6*  PLT 188 166   BMET  Basename 05/05/12 0635 05/04/12 0345  NA 132* 134*  K 3.4* 4.0  CL 92* 97  CO2 23 25  GLUCOSE 120* 141*  BUN 45* 45*  CREATININE 4.02* 4.02*  CALCIUM 9.8 9.9   PT/INR No results found for this basename: LABPROT:2,INR:2 in the last 72 hours ABG No results found for this basename: PHART:2,PCO2:2,PO2:2,HCO3:2 in the last 72 hours  Studies/Results: Ct Abdomen Pelvis W Contrast  05/04/2012  *RADIOLOGY REPORT*  Clinical Data: Status post surgery to treat the small bowel obstruction.  A right lower quadrant abscess was treated with percutaneous drainage on 04/30/2012.  CT ABDOMEN AND PELVIS WITH CONTRAST  Technique:  Multidetector CT imaging of the abdomen and pelvis was performed following the standard protocol during bolus administration of intravenous contrast.  Contrast:  75 ml Omnipaque-300 IV  Comparison: 04/30/2012 and 04/29/2012  Findings: Visualized lung bases show a left  basilar pleural effusion and consolidation/atelectasis of the posterior left lower lobe.  There is extravasation of oral contrast from the small bowel into a residual abscess cavity within the right lower quadrant of the abdomen.  The percutaneous drain is located in the posterior/dependent aspect of the cavity and shows some surrounding debris which may be acting to occlude/partially occlude the catheter.  The abscess cavity remains of significant size, measuring over 10 cm in greatest  dimensions.  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  Consider repositioning/upsizing of the current percutaneous drain or placement of a second drain into the more anterior aspect of the residual collection.  No new abscess collections are identified. There is no evidence of gross free intraperitoneal air.  Solid organs of the abdomen are stable in appearance.  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 Indwelling percutaneous drain lies in the dependent portion of this collection which may contain debris obstructing the catheter. Given the large residual collection present, further drain manipulation including potential repositioning and upsizing of the current drain and also potentially placement of a second more anteriorly positioned drain may be helpful.  This will be communicated to the interventional Radiology service.  Original Report Authenticated By: Reola Calkins, M.D.   Ir Catheter Tube Change  05/05/2012  *RADIOLOGY REPORT*  Clinical history:51 year old with multiple medical problems including a small bowel leak.  The patient has a right lower quadrant drain which is incompletely draining the large abscess collection.  PROCEDURE(S): EXCHANGE AND REPOSITIONING OF THE PERCUTANEOUS DRAINAGE CATHETER WITH FLUOROSCOPY  Physician: Rachelle Hora. Henn, MD  Medications:Versed 2 mg, Fentanyl 25 mcg. A radiology nurse monitored the patient for moderate sedation.  Moderate sedation time:22 minutes  Fluoroscopy time: 3.8 minutes  Procedure:Informed consent was obtained for a drain  repositioning and exchange.  The patient was placed supine on the interventional table.  The existing catheter in the right lower quadrant was prepped and draped in a sterile fashion.  Maximal barrier sterile technique was utilized including caps, mask, sterile gowns, sterile gloves, sterile drape, hand hygiene and skin antiseptic.  Catheter was injected with contrast and the catheter was cut and removed over a Bentson wire.  A Kumpe catheter was advanced into the upper portion of the collection.  Position was confirmed with ultrasound. A 12-French biliary drain was advanced over a Bentson wire and into the superior aspect the collection.  Greater than 200 ml of dark brown fluid was removed.  Catheter sutured to the skin and attached to a gravity bag.  Findings:The old catheter was located within the inferior posterior aspect of the abscess collection.  Adjacent to the collection, there is high density material which is probably related to the small bowel fistula or leak.  Catheter and wire were advanced into the anterior and superior aspect of the collection.  Biliary drain was also advanced into the superior aspect of the collection. Greater than 200 ml of dark brown fluid was removed.  Complications: None  Impression:Successful exchange and repositioning of the drainage catheter.  The large abscess was successfully decompressed following placement of the new  catheter.  Original Report Authenticated By: Richarda Overlie, M.D.   Dg Chest Port 1 View  05/05/2012  *RADIOLOGY REPORT*  Clinical Data: Chest pain.  PORTABLE CHEST - 1 VIEW  Comparison: 04/28/2012  Findings: Nasogastric tube extends at least as   the   stomach, tip not seen.  Left lower lung consolidation / atelectasis has partially improved since the previous exam.  Right lung remains clear.  Probable small left pleural effusion.  Regional bones unremarkable.  IMPRESSION:  1.  Slight improvement in the left lower lung consolidation / atelectasis, with  persistent small effusion.  Original Report Authenticated By: Osa Craver, M.D.    Anti-infectives: Anti-infectives     Start     Dose/Rate Route Frequency Ordered Stop   04/30/12 1400   vancomycin (VANCOCIN) 750 mg in sodium chloride 0.9 % 150 mL IVPB  Status:  Discontinued        750 mg 150 mL/hr over 60 Minutes Intravenous  Once 04/30/12 1212 04/30/12 1507   04/29/12 2330   vancomycin (VANCOCIN) 1,500 mg in sodium chloride 0.9 % 500 mL IVPB        1,500 mg 250 mL/hr over 120 Minutes Intravenous  Once 04/29/12 2259 04/30/12 0246   04/27/12 0000   ceFAZolin (ANCEF) IVPB 1 g/50 mL premix  Status:  Discontinued     Comments: Send with pt to OR      1 g 100 mL/hr over 30 Minutes Intravenous On call 04/26/12 0927 04/26/12 0928   04/24/12 1000   micafungin (MYCAMINE) 100 mg in sodium chloride 0.9 % 100 mL IVPB        100 mg 100 mL/hr over 1 Hours Intravenous Daily 04/24/12 0750     04/23/12 1600   fluconazole (DIFLUCAN) IVPB 400 mg        400 mg 200 mL/hr over 60 Minutes Intravenous  Once 04/23/12 1428 04/23/12 1800   04/22/12 2200   piperacillin-tazobactam (ZOSYN) IVPB 3.375 g  Status:  Discontinued        3.375 g 100 mL/hr over 30 Minutes Intravenous 3 times per day 04/22/12 2120 04/22/12 2137   04/22/12 2200   piperacillin-tazobactam (ZOSYN) IVPB 2.25 g        2.25 g 100 mL/hr over 30 Minutes Intravenous 3 times per day 04/22/12 2140     04/22/12 2157   gentamicin (GARAMYCIN) injection  Status:  Discontinued          As needed 04/22/12 2158 04/22/12 2335   04/22/12 2156   clindamycin (CLEOCIN) injection  Status:  Discontinued          As needed 04/22/12 2157 04/22/12 2335   04/22/12 2145   piperacillin-tazobactam (ZOSYN) IVPB 3.375 g        3.375 g 12.5 mL/hr over 240 Minutes Intravenous  Once 04/22/12 2141 04/23/12 0145   04/22/12 2130   gentamicin (GARAMYCIN) 240 mg, clindamycin (CLEOCIN) 900 mg in sodium chloride irrigation 0.9 % 1,000 mL irrigation  Status:   Discontinued         Irrigation  Once 04/22/12 2119 04/23/12 0020   04/14/12 0830   ertapenem (INVANZ) 1 g in sodium chloride 0.9 % 50 mL IVPB        1 g 100 mL/hr over 30 Minutes Intravenous To Surgery 04/14/12 0816 04/14/12 0834   04/13/12 1359   ertapenem (INVANZ) 1 g in sodium chloride 0.9 % 50 mL IVPB  Status:  Discontinued        1  g 100 mL/hr over 30 Minutes Intravenous 60 min pre-op 04/13/12 1359 04/14/12 0816          Assessment/Plan: s/p Procedure(s) (LRB): THROMBECTOMY ARTERIOVENOUS GORE-TEX GRAFT (Left) REVISION OF ARTERIOVENOUS GORETEX GRAFT (Left)   1. S/P percutaneous drain upsizing done 05/05/12. 2. Continue with ABX, TNA, Lipids. 3. Recent episode of CP and hypertension with elevated troponin's ; medicines note and management are seen and appreciated. 3. Per Nursing report IV access again becoming an issue as his one peripheral IV in the left forearm is starting to leak. ? Sites for additional IV access. (previous attempts at finding access have been challenging at best) Will discuss with Dr. Carolynne Edouard, as to options. 4. CBC in am 5. Continue with current dressing changes, hydrotherapy for wound care.   LOS: 34 days    Nasiyah Laverdiere 05/06/2012

## 2012-05-07 DIAGNOSIS — K651 Peritoneal abscess: Secondary | ICD-10-CM

## 2012-05-07 LAB — BASIC METABOLIC PANEL
GFR calc Af Amer: 17 mL/min — ABNORMAL LOW (ref >90)
GFR calc non Af Amer: 15 mL/min — ABNORMAL LOW (ref >90)
Potassium: 3.9 mEq/L (ref 3.5–5.1)
Sodium: 128 mEq/L — ABNORMAL LOW (ref 135–145)

## 2012-05-07 LAB — GLUCOSE, CAPILLARY
Glucose-Capillary: 99 mg/dL (ref 70–99)
Glucose-Capillary: 119 mg/dL — ABNORMAL HIGH (ref 70–99)
Glucose-Capillary: 139 mg/dL — ABNORMAL HIGH (ref 70–99)

## 2012-05-07 LAB — CBC
Hemoglobin: 11.7 g/dL — ABNORMAL LOW (ref 13.0–17.0)
Hemoglobin: 11.8 g/dL — ABNORMAL LOW (ref 13.0–17.0)
MCH: 28.2 pg (ref 26.0–34.0)
MCHC: 32.3 g/dL (ref 30.0–36.0)
MCHC: 33.1 g/dL (ref 30.0–36.0)
RDW: 21.6 % — ABNORMAL HIGH (ref 11.5–15.5)

## 2012-05-07 MED ORDER — LIDOCAINE HCL (PF) 1 % IJ SOLN: 5.0000 mL | INTRAMUSCULAR | Status: DC | PRN

## 2012-05-07 MED ORDER — FAT EMULSION 20 % IV EMUL
250.0000 mL | INTRAVENOUS | Status: AC
  Administered 2012-05-07: 250 mL via INTRAVENOUS
  Filled 2012-05-07: qty 250

## 2012-05-07 MED ORDER — HEPARIN SODIUM (PORCINE) 1000 UNIT/ML DIALYSIS
1000.0000 [IU] | INTRAMUSCULAR | Status: DC | PRN
  Filled 2012-05-07: qty 1

## 2012-05-07 MED ORDER — SODIUM CHLORIDE 0.9 % IV SOLN: 100.0000 mL | INTRAVENOUS | Status: DC | PRN

## 2012-05-07 MED ORDER — LIDOCAINE-PRILOCAINE 2.5-2.5 % EX CREA
1.0000 "application " | TOPICAL_CREAM | CUTANEOUS | Status: DC | PRN
  Filled 2012-05-07: qty 5

## 2012-05-07 MED ORDER — FLUCONAZOLE IN SODIUM CHLORIDE 400-0.9 MG/200ML-% IV SOLN
400.0000 mg | INTRAVENOUS | Status: DC
  Administered 2012-05-08 – 2012-05-18 (×5): 400 mg via INTRAVENOUS
  Filled 2012-05-07 (×6): qty 200

## 2012-05-07 MED ORDER — ALTEPLASE 2 MG IJ SOLR
2.0000 mg | Freq: Once | INTRAMUSCULAR | Status: AC | PRN
  Filled 2012-05-07: qty 2

## 2012-05-07 MED ORDER — HYDROCORTISONE SOD SUCCINATE 100 MG IJ SOLR
25.0000 mg | Freq: Every day | INTRAMUSCULAR | Status: DC
  Administered 2012-05-08 – 2012-05-21 (×14): 25 mg via INTRAVENOUS
  Filled 2012-05-07 (×15): qty 0.5

## 2012-05-07 MED ORDER — HEPARIN SODIUM (PORCINE) 1000 UNIT/ML DIALYSIS
40.0000 [IU]/kg | Freq: Once | INTRAMUSCULAR | Status: AC
  Administered 2012-05-11: 2200 [IU] via INTRAVENOUS_CENTRAL
  Filled 2012-05-07: qty 3

## 2012-05-07 MED ORDER — PENTAFLUOROPROP-TETRAFLUOROETH EX AERO: 1.0000 "application " | INHALATION_SPRAY | CUTANEOUS | Status: DC | PRN

## 2012-05-07 MED ORDER — NEPRO/CARBSTEADY PO LIQD: 237.0000 mL | ORAL | Status: DC | PRN

## 2012-05-07 MED ORDER — ZINC TRACE METAL 1 MG/ML IV SOLN
INTRAVENOUS | Status: AC
  Administered 2012-05-07: 18:00:00 via INTRAVENOUS
  Filled 2012-05-07: qty 2000

## 2012-05-07 NOTE — Progress Notes (Signed)
[facility]    Date of Admission:  04/02/2012     Total days of antibiotics 16  Zosyn Day 16  Micafungin Day 14   Principal Problem:  *SBO (small bowel obstruction) s/p EL/LOA and SBR x 2 Active Problems:  CKD (chronic kidney disease) stage V requiring chronic dialysis  Erythrocytosis  Thrombocytopenia  Gout  HTN (hypertension)  Ileus following gastrointestinal surgery  Chronic use of steroids  Malnutrition, calorie  Leg graft occlusion  Acute respiratory failure with hypoxia  Sinus tachycardia  Abscess of abdominal cavity      . antiseptic oral rinse  15 mL Mouth Rinse q12n4p  . chlorhexidine  15 mL Mouth Rinse BID  . darbepoetin (ARANESP) injection - DIALYSIS  200 mcg Intravenous Q Sat-HD  . fentaNYL  37.5 mcg Transdermal Q72H  . ferric gluconate (FERRLECIT/NULECIT) IV  125 mg Intravenous Q T,Th,Sa-HD  . heparin  40 Units/kg Dialysis Once in dialysis  . hydrocortisone sod succinate (SOLU-CORTEF) injection  25 mg Intravenous Q12H  . insulin aspart  0-15 Units Subcutaneous Q4H  . lip balm   Topical BID  . metoprolol  5 mg Intravenous Q6H  . micafungin (MYCAMINE) IV  100 mg Intravenous Daily  . pantoprazole (PROTONIX) IV  40 mg Intravenous Q24H  . piperacillin-tazobactam (ZOSYN)  IV  2.25 g Intravenous Q8H  . potassium chloride  10 mEq Intravenous Q1 Hr x 3  . potassium chloride  10 mEq Intravenous Once  . sodium chloride  10-40 mL Intracatheter Q12H    Subjective: Pt is progressively having more energy each day. Today he was verbally communicating with me rather than just pointing. He has no new complaints (continues to complain of abdominal pain) and denies any chest pain, headache, N/V.  Objective: Temp:  [97 F (36.1 C)-98.3 F (36.8 C)] 97.7 F (36.5 C) (08/16 0800) Pulse Rate:  [76-117] 79  (08/16 0800) Resp:  [12-23] 16  (08/16 0800) BP: (136-174)/(69-94) 163/86 mmHg (08/16 0800) SpO2:  [98 %-100 %] 100 % (08/16  0800) Weight:  [53.1 kg (117 lb 1 oz)-54.7 kg (120 lb 9.5 oz)] 54.7 kg (120 lb 9.5 oz) (08/16 0000)  General: oriented x 3, NAD, lethargic  HEENT: PERRL, EOMI, scleral icterus  Cardiac: RRR, no rubs, murmurs or gallops  Pulm: clear to auscultation bilaterally, moving normal volumes of air  Abd: soft, tender, nondistended, BS minimal  Ext: warm and well perfused, left leg +2 pitting edema  Neuro: alert and oriented X3, cranial nerves II-XII grossly intact   Lab Results Lab Results  Component Value Date   WBC 13.9* 05/07/2012   HGB 11.7* 05/07/2012   HCT 36.2* 05/07/2012   MCV 87.2 05/07/2012   PLT 133* 05/07/2012    Lab Results  Component Value Date   CREATININE 5.69* 05/06/2012   BUN 78* 05/06/2012   NA 124* 05/06/2012   K 3.2* 05/06/2012   CL 86* 05/06/2012   CO2 20 05/06/2012    Lab Results  Component Value Date   ALT 23 05/03/2012   AST 21 05/03/2012   ALKPHOS 119* 05/03/2012   BILITOT 1.5* 05/03/2012      Microbiology: Recent Results (from the past 240 hour(s))  CULTURE, BLOOD (ROUTINE X 2)     Status: Normal   Collection Time   04/28/12  3:38 PM      Component Value Range Status Comment   Specimen Description BLOOD RIGHT HAND   Final    Special Requests BOTTLES DRAWN  AEROBIC ONLY 5CC   Final    Culture  Setup Time 04/28/2012 22:13   Final    Culture NO GROWTH 5 DAYS   Final    Report Status 05/04/2012 FINAL   Final   CULTURE, BLOOD (ROUTINE X 2)     Status: Normal   Collection Time   04/28/12  3:48 PM      Component Value Range Status Comment   Specimen Description BLOOD RIGHT ARM   Final    Special Requests BOTTLES DRAWN AEROBIC ONLY 5CC   Final    Culture  Setup Time 04/28/2012 22:12   Final    Culture NO GROWTH 5 DAYS   Final    Report Status 05/04/2012 FINAL   Final   ANAEROBIC CULTURE     Status: Normal   Collection Time   04/30/12  5:15 PM      Component Value Range Status Comment   Specimen Description ABSCESS ABDOMEN   Final    Special Requests 4 SYRINGES @ 60CC    Final    Gram Stain     Final    Value: ABUNDANT WBC PRESENT, PREDOMINANTLY PMN     NO SQUAMOUS EPITHELIAL CELLS SEEN     ABUNDANT GRAM POSITIVE COCCI IN PAIRS     IN CLUSTERS   Culture NO ANAEROBES ISOLATED   Final    Report Status 05/05/2012 FINAL   Final   FUNGUS CULTURE W SMEAR     Status: Normal (Preliminary result)   Collection Time   04/30/12  5:15 PM      Component Value Range Status Comment   Specimen Description ABSCESS ABDOMEN   Final    Special Requests 4 SYRINGES @ 60CC   Final    Fungal Smear NO YEAST OR FUNGAL ELEMENTS SEEN   Final    Culture YEAST ISOLATED;ID TO FOLLOW   Final    Report Status PENDING   Incomplete   CULTURE, ROUTINE-ABSCESS     Status: Normal   Collection Time   04/30/12  5:15 PM      Component Value Range Status Comment   Specimen Description ABSCESS ABDOMEN   Final    Special Requests 4 SYRINGES @ 60CC   Final    Gram Stain     Final    Value: ABUNDANT WBC PRESENT, PREDOMINANTLY PMN     NO SQUAMOUS EPITHELIAL CELLS SEEN     ABUNDANT GRAM POSITIVE COCCI IN PAIRS     IN CLUSTERS   Culture FEW YEAST CONSISTENT WITH CANDIDA SPECIES   Final    Report Status 05/04/2012 FINAL   Final     Studies/Results: Ir Catheter Tube Change  05/05/2012  *RADIOLOGY REPORT*  Clinical history:51 year old with multiple medical problems including a small bowel leak.  The patient has a right lower quadrant drain which is incompletely draining the large abscess collection.  PROCEDURE(S): EXCHANGE AND REPOSITIONING OF THE PERCUTANEOUS DRAINAGE CATHETER WITH FLUOROSCOPY  Physician: Rachelle Hora. Henn, MD  Medications:Versed 2 mg, Fentanyl 25 mcg. A radiology nurse monitored the patient for moderate sedation.  Moderate sedation time:22 minutes  Fluoroscopy time: 3.8 minutes  Procedure:Informed consent was obtained for a drain repositioning and exchange.  The patient was placed supine on the interventional table.  The existing catheter in the right lower quadrant was prepped and draped  in a sterile fashion.  Maximal barrier sterile technique was utilized including caps, mask, sterile gowns, sterile gloves, sterile drape, hand hygiene and skin antiseptic.  Catheter was injected  with contrast and the catheter was cut and removed over a Bentson wire.  A Kumpe catheter was advanced into the upper portion of the collection.  Position was confirmed with ultrasound. A 12-French biliary drain was advanced over a Bentson wire and into the superior aspect the collection.  Greater than 200 ml of dark brown fluid was removed.  Catheter sutured to the skin and attached to a gravity bag.  Findings:The old catheter was located within the inferior posterior aspect of the abscess collection.  Adjacent to the collection, there is high density material which is probably related to the small bowel fistula or leak.  Catheter and wire were advanced into the anterior and superior aspect of the collection.  Biliary drain was also advanced into the superior aspect of the collection. Greater than 200 ml of dark brown fluid was removed.  Complications: None  Impression:Successful exchange and repositioning of the drainage catheter.  The large abscess was successfully decompressed following placement of the new catheter.  Original Report Authenticated By: Richarda Overlie, M.D.   Dg Chest Port 1 View  05/05/2012  *RADIOLOGY REPORT*  Clinical Data: Chest pain.  PORTABLE CHEST - 1 VIEW  Comparison: 04/28/2012  Findings: Nasogastric tube extends at least as   the   stomach, tip not seen.  Left lower lung consolidation / atelectasis has partially improved since the previous exam.  Right lung remains clear.  Probable small left pleural effusion.  Regional bones unremarkable.  IMPRESSION:  1.  Slight improvement in the left lower lung consolidation / atelectasis, with persistent small effusion.  Original Report Authenticated By: Osa Craver, M.D.   Assessment/Plan:  Pt's WBC has decreased for 4 consecutive days and is  currently at 13.9.  Lab was called to confirm whether or not any culture growth was seen. They confirmed that only small amounts of yeast have grown on culture despite Gram stain showing GPC.   - Continue Zosyn and Micafungin for abscess as WBC count is trending down  - called lab for speciation of Candida and sensitivity to ensure proper Abx treatment. Will take 2-3 days for results. Candida Tropicalis identified. Sensitivity pending.  - continue Rx with Mycafungin. Other options include Azoles or Amphotericin B if sensitivity shows resistance to Mycafungin.   Lenox [facility] 05/07/2012, 9:34 AM  This is a Medical Student Note. The care of the patient was discussed with Dr. Daiva Eves and the assessment and plan was formulated with their assistance. Please see their note for official documentation of the patient encounter.      INFECTIOUS DISEASE ATTENDING ADDENDUM:     [facility]   Date: 05/07/2012  Patient name: [name]  Medical record number: [id number]  Date of birth: 1961-05-14    This patient has been seen and discussed with the house staff. Please see their note for complete details. I concur with their findings with the following additions/corrections:  Patient has improved with drainage of his abscess he has a fistual between stomach, abscess and skin and his prognosis remains very poor  I would continue zosyn and change the mycafungin to fluconazole given that the yeast = Candida Tropicalis which is sensitive as a species to azoles  I would continue the current regimen for at least another month with repeat CT scan done in the interim to ensure that the abscess is smaller.  With the fistulas present he may need to be on  indefinite antibiotics with drains in place.  I will sign off for now please CALL us BACK  if the pts condition deteriorates and our guidance is needed OR IF AND  WHEN THE patient is nearing possible DC from the hospital, so that we can arrange hospital followup    Paulette Blanch Dam 05/07/2012, 5:14 PM

## 2012-05-07 NOTE — Consult Note (Signed)
WOC consult Note Reason for Consult: Consult requested for abd leak and Eakins pouch application. Wound type: Full thickness post-op wound Measurement: 17X2X.2cm Wound bed: 100% beefy red, retention sutures visible.   Drainage (amount, consistency, odor)  Lower abd wound at 12 o' clock with large amt thick tan-green drainage Periwound: Intact skin surrounding Dressing procedure/placement/frequency: Applied small Eakin pouch to lower abd wound.  Next size of pouch available in system is much larger if we must go to that option. Upper wound is exposed outside of Eakin pouch and Aquacel applied to promote healing. Supplies ordered to bedside for staff use.  Plan to reassess Monday.   Cammie Mcgee, RN, MSN, [employer]  [phone]

## 2012-05-07 NOTE — Progress Notes (Signed)
Patient ID: [name], male   DOB: [birth year], 51 y.o.   MRN: [id number] Patient ID: [name], male   DOB: [birth year], 30 y.o.   MRN: [id number] Patient ID: [name], male   DOB: 08-27-1961, 46 y.o.   MRN: [id number] Patient ID: [name], male   DOB: 28-Feb-1961, 64 y.o.   MRN: [id number] Patient ID: [name], male   DOB: 20-Feb-1961, 17 y.o.   MRN: [id number] 10 Days Post-Op  Subjective: In bed A/A/O  States that he slept well last night. No reoccurance of CP. No c/o offered by patient at this time.  Objective: Vital signs in last 24 hours: Temp:  [96.6 F (35.9 C)-98.3 F (36.8 C)] 98.1 F (36.7 C) (08/16 0400) Pulse Rate:  [76-117] 81  (08/16 0700) Resp:  [12-28] 19  (08/16 0700) BP: (136-186)/(69-94) 163/84 mmHg (08/16 0700) SpO2:  [98 %-100 %] 100 % (08/16 0700) Weight:  [117 lb 1 oz (53.1 kg)-124 lb 12.5 oz (56.6 kg)] 120 lb 9.5 oz (54.7 kg) (08/16 0000) Last BM Date:  (unable to verbalize last bm)  Intake/Output from previous day: 08/15 0701 - 08/16 0700 In: 1760 [IV Piggyback:360; TPN:1390] Out: 2030 [Emesis/NG output:150; Drains:30] Intake/Output this shift:    Dressing C/D/I at present no drainage seen from wound. Abdomen soft. No BS. Minimal drainage from perc drain ( 30ml for past 24 hrs recorded: however nurse states that she noticed a " fair" amount of drainage from his wound similar to the drainage in the collection pouch. NG remains in place. (150 ml recorded as output for past 24 hrs) WBC continue to trend downward post IR procedure currently at 13.9 VS remain stable and he remains afebrile. Labs stable.   Lab Results:   [facility] 05/06/12 0809 05/06/12 0450  WBC 23.4* 27.5*  HGB 11.1* 11.4*  HCT 32.7* 33.8*  PLT 172 188   BMET  Basename 05/06/12 0809 05/05/12 0635  NA 124* 132*  K 3.2* 3.4*  CL 86* 92*  CO2 20 23  GLUCOSE 116* 120*  BUN 78* 45*  CREATININE 5.69* 4.02*  CALCIUM 10.1 9.8   PT/INR No results found for this  basename: LABPROT:2,INR:2 in the last 72 hours ABG No results found for this basename: PHART:2,PCO2:2,PO2:2,HCO3:2 in the last 72 hours  Studies/Results: Ir Catheter Tube Change  05/05/2012  *RADIOLOGY REPORT*  Clinical history:51 year old with multiple medical problems including a small bowel leak.  The patient has a right lower quadrant drain which is incompletely draining the large abscess collection.  PROCEDURE(S): EXCHANGE AND REPOSITIONING OF THE PERCUTANEOUS DRAINAGE CATHETER WITH FLUOROSCOPY  Physician: Rachelle Hora. Henn, MD  Medications:Versed 2 mg, Fentanyl 25 mcg. A radiology nurse monitored the patient for moderate sedation.  Moderate sedation time:22 minutes  Fluoroscopy time: 3.8 minutes  Procedure:Informed consent was obtained for a drain repositioning and exchange.  The patient was placed supine on the interventional table.  The existing catheter in the right lower quadrant was prepped and draped in a sterile fashion.  Maximal barrier sterile technique was utilized including caps, mask, sterile gowns, sterile gloves, sterile drape, hand hygiene and skin antiseptic.  Catheter was injected with contrast and the catheter was cut and removed over a Bentson wire.  A Kumpe catheter was advanced into the upper portion of the collection.  Position was confirmed with ultrasound. A 12-French biliary drain was advanced over a Bentson wire and into the superior aspect the collection.  Greater than 200 ml of dark brown fluid  was removed.  Catheter sutured to the skin and attached to a gravity bag.  Findings:The old catheter was located within the inferior posterior aspect of the abscess collection.  Adjacent to the collection, there is high density material which is probably related to the small bowel fistula or leak.  Catheter and wire were advanced into the anterior and superior aspect of the collection.  Biliary drain was also advanced into the superior aspect of the collection. Greater than 200 ml of dark  brown fluid was removed.  Complications: None  Impression:Successful exchange and repositioning of the drainage catheter.  The large abscess was successfully decompressed following placement of the new catheter.  Original Report Authenticated By: Richarda Overlie, M.D.   Dg Chest Port 1 View  05/05/2012  *RADIOLOGY REPORT*  Clinical Data: Chest pain.  PORTABLE CHEST - 1 VIEW  Comparison: 04/28/2012  Findings: Nasogastric tube extends at least as   the   stomach, tip not seen.  Left lower lung consolidation / atelectasis has partially improved since the previous exam.  Right lung remains clear.  Probable small left pleural effusion.  Regional bones unremarkable.  IMPRESSION:  1.  Slight improvement in the left lower lung consolidation / atelectasis, with persistent small effusion.  Original Report Authenticated By: Osa Craver, M.D.    Anti-infectives: Anti-infectives     Start     Dose/Rate Route Frequency Ordered Stop   04/30/12 1400   vancomycin (VANCOCIN) 750 mg in sodium chloride 0.9 % 150 mL IVPB  Status:  Discontinued        750 mg 150 mL/hr over 60 Minutes Intravenous  Once 04/30/12 1212 04/30/12 1507   04/29/12 2330   vancomycin (VANCOCIN) 1,500 mg in sodium chloride 0.9 % 500 mL IVPB        1,500 mg 250 mL/hr over 120 Minutes Intravenous  Once 04/29/12 2259 04/30/12 0246   04/27/12 0000   ceFAZolin (ANCEF) IVPB 1 g/50 mL premix  Status:  Discontinued     Comments: Send with pt to OR      1 g 100 mL/hr over 30 Minutes Intravenous On call 04/26/12 0927 04/26/12 0928   04/24/12 1000   micafungin (MYCAMINE) 100 mg in sodium chloride 0.9 % 100 mL IVPB        100 mg 100 mL/hr over 1 Hours Intravenous Daily 04/24/12 0750     04/23/12 1600   fluconazole (DIFLUCAN) IVPB 400 mg        400 mg 200 mL/hr over 60 Minutes Intravenous  Once 04/23/12 1428 04/23/12 1800   04/22/12 2200   piperacillin-tazobactam (ZOSYN) IVPB 3.375 g  Status:  Discontinued        3.375 g 100 mL/hr over 30  Minutes Intravenous 3 times per day 04/22/12 2120 04/22/12 2137   04/22/12 2200   piperacillin-tazobactam (ZOSYN) IVPB 2.25 g        2.25 g 100 mL/hr over 30 Minutes Intravenous 3 times per day 04/22/12 2140     04/22/12 2157   gentamicin (GARAMYCIN) injection  Status:  Discontinued          As needed 04/22/12 2158 04/22/12 2335   04/22/12 2156   clindamycin (CLEOCIN) injection  Status:  Discontinued          As needed 04/22/12 2157 04/22/12 2335   04/22/12 2145   piperacillin-tazobactam (ZOSYN) IVPB 3.375 g        3.375 g 12.5 mL/hr over 240 Minutes Intravenous  Once 04/22/12 2141 04/23/12  0145   04/22/12 2130   gentamicin (GARAMYCIN) 240 mg, clindamycin (CLEOCIN) 900 mg in sodium chloride irrigation 0.9 % 1,000 mL irrigation  Status:  Discontinued         Irrigation  Once 04/22/12 2119 04/23/12 0020   04/14/12 0830   ertapenem (INVANZ) 1 g in sodium chloride 0.9 % 50 mL IVPB        1 g 100 mL/hr over 30 Minutes Intravenous To Surgery 04/14/12 0816 04/14/12 0834   04/13/12 1359   ertapenem (INVANZ) 1 g in sodium chloride 0.9 % 50 mL IVPB  Status:  Discontinued        1 g 100 mL/hr over 30 Minutes Intravenous 60 min pre-op 04/13/12 1359 04/14/12 0816          Assessment/Plan: s/p Procedure(s) (LRB): THROMBECTOMY ARTERIOVENOUS GORE-TEX GRAFT (Left) REVISION OF ARTERIOVENOUS GORETEX GRAFT (Left)   1. S/P percutaneous drain upsizing done 05/05/12. 2. Continue with ABX, TNA, Lipids. 3. Recent episode of CP and hypertension with elevated troponin's ; medicines note and management are seen and appreciated. 3. Per Nursing report IV access again becoming an issue as his one peripheral IV in the left forearm is starting to leak. ? Sites for additional IV access. (previous attempts at finding access have been challenging at best) Will discuss with Dr. Carolynne Edouard, as to options. 4. CBC in am 5. Continue with current dressing changes, hydrotherapy for wound care.   LOS: 35 days    Izaak Sahr,  Cyruss Arata 05/07/2012

## 2012-05-07 NOTE — Progress Notes (Signed)
ANTIBIOTIC CONSULT NOTE - FOLLOW UP  Pharmacy Consult for Fluconazole and Zosyn Indication: abdominal abscess with fistula  Allergies  Allergen Reactions  . Allopurinol     REACTION: decreased platelets  . Aspirin     REACTION: unspecified    Patient Measurements: Height: 5\' 11"  (180.3 cm) Weight: 120 lb 9.5 oz (54.7 kg) IBW/kg (Calculated) : 75.3   Vital Signs: Temp: 97.8 F (36.6 C) (08/16 1600) Temp src: Oral (08/16 1600) BP: 160/78 mmHg (08/16 1600) Pulse Rate: 72  (08/16 1600) Intake/Output from previous day: 08/15 0701 - 08/16 0700 In: 1780 [I.V.:20; IV Piggyback:360; TPN:1390] Out: 2030 [Emesis/NG output:150; Drains:30]  Labs:  [facility] 05/07/12 0930 05/07/12 0645 05/06/12 0809 05/05/12 0635  WBC 15.5* 13.9* 23.4* --  HGB 11.8* 11.7* 11.1* --  PLT 136* 133* 172 --  LABCREA -- -- -- --  CREATININE 4.25* -- 5.69* 4.02*   Assessment:   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  To change from Micafungin to Fluconazole.  Today's Micafungin dose already given this am, so will begin Fluconazole tomorrow after hemodialysis.  Zosyn to continue; antibiotics planned for at least another month, but indefinite with drains in place and fistula.    8/1 Zosyn>>   8/3 Micafungin >> 8/16   8/2 Fluconazole 400 mg IV x 1 >>resume 8/17  Goal of Therapy:    Appropriate doses for renal function and infection  Plan:    Fluconazole 400 mg IV after each hemodialysis, Tuesday-Thursday-Saturday.  First dose 8/17.   Continue Zosyn 2.25 grams IV q8 hrs.  Dennie Fetters, RPh Pager: [phone] 05/07/2012,5:52 PM

## 2012-05-07 NOTE — Progress Notes (Signed)
10 Days Post-Op  Subjective: RLQ abscess drain placed 8/9 - minimal output Up sized 8/14 - output better Pt feels some better  Objective: Vital signs in last 24 hours: Temp:  [97 F (36.1 C)-98.3 F (36.8 C)] 97.7 F (36.5 C) (08/16 0800) Pulse Rate:  [70-111] 70  (08/16 1100) Resp:  [12-23] 16  (08/16 1100) BP: (139-174)/(69-94) 156/76 mmHg (08/16 1100) SpO2:  [98 %-100 %] 100 % (08/16 1100) Weight:  [117 lb 1 oz (53.1 kg)-120 lb 9.5 oz (54.7 kg)] 120 lb 9.5 oz (54.7 kg) (08/16 0000) Last BM Date:  (unable to verbalize last bm)  Intake/Output from previous day: 08/15 0701 - 08/16 0700 In: 1780 [I.V.:20; IV Piggyback:360; TPN:1390] Out: 2030 [Emesis/NG output:150; Drains:30] Intake/Output this shift: Total I/O In: 460 [I.V.:80; IV Piggyback:100; TPN:280] Out: 100 [Emesis/NG output:100]  PE:  Afeb; VSS Wbc 15.5(13.9) Site of drain clean andr dry NT to touch; no sign of infection Output 30 cc yesterday - greenish brown  Lab Results:   [facility] 05/07/12 0930 05/07/12 0645  WBC 15.5* 13.9*  HGB 11.8* 11.7*  HCT 35.6* 36.2*  PLT 136* 133*   BMET  Basename 05/07/12 0930 05/06/12 0809  NA 128* 124*  K 3.9 3.2*  CL 91* 86*  CO2 22 20  GLUCOSE 115* 116*  BUN 51* 78*  CREATININE 4.25* 5.69*  CALCIUM 9.5 10.1   PT/INR No results found for this basename: LABPROT:2,INR:2 in the last 72 hours ABG No results found for this basename: PHART:2,PCO2:2,PO2:2,HCO3:2 in the last 72 hours  Studies/Results: Ir Catheter Tube Change  05/05/2012  *RADIOLOGY REPORT*  Clinical history:51 year old with multiple medical problems including a small bowel leak.  The patient has a right lower quadrant drain which is incompletely draining the large abscess collection.  PROCEDURE(S): EXCHANGE AND REPOSITIONING OF THE PERCUTANEOUS DRAINAGE CATHETER WITH FLUOROSCOPY  Physician: Rachelle Hora. Henn, MD  Medications:Versed 2 mg, Fentanyl 25 mcg. A radiology nurse monitored the patient for moderate  sedation.  Moderate sedation time:22 minutes  Fluoroscopy time: 3.8 minutes  Procedure:Informed consent was obtained for a drain repositioning and exchange.  The patient was placed supine on the interventional table.  The existing catheter in the right lower quadrant was prepped and draped in a sterile fashion.  Maximal barrier sterile technique was utilized including caps, mask, sterile gowns, sterile gloves, sterile drape, hand hygiene and skin antiseptic.  Catheter was injected with contrast and the catheter was cut and removed over a Bentson wire.  A Kumpe catheter was advanced into the upper portion of the collection.  Position was confirmed with ultrasound. A 12-French biliary drain was advanced over a Bentson wire and into the superior aspect the collection.  Greater than 200 ml of dark brown fluid was removed.  Catheter sutured to the skin and attached to a gravity bag.  Findings:The old catheter was located within the inferior posterior aspect of the abscess collection.  Adjacent to the collection, there is high density material which is probably related to the small bowel fistula or leak.  Catheter and wire were advanced into the anterior and superior aspect of the collection.  Biliary drain was also advanced into the superior aspect of the collection. Greater than 200 ml of dark brown fluid was removed.  Complications: None  Impression:Successful exchange and repositioning of the drainage catheter.  The large abscess was successfully decompressed following placement of the new catheter.  Original Report Authenticated By: Richarda Overlie, M.D.   Dg Chest Port 1 View  05/05/2012  *  RADIOLOGY REPORT*  Clinical Data: Chest pain.  PORTABLE CHEST - 1 VIEW  Comparison: 04/28/2012  Findings: Nasogastric tube extends at least as   the   stomach, tip not seen.  Left lower lung consolidation / atelectasis has partially improved since the previous exam.  Right lung remains clear.  Probable small left pleural effusion.   Regional bones unremarkable.  IMPRESSION:  1.  Slight improvement in the left lower lung consolidation / atelectasis, with persistent small effusion.  Original Report Authenticated By: Osa Craver, M.D.    Anti-infectives:   Assessment/Plan: s/p Procedure(s) (LRB): THROMBECTOMY ARTERIOVENOUS GORE-TEX GRAFT (Left) REVISION OF ARTERIOVENOUS GORETEX GRAFT (Left)   abd abscess drain intact Up sized 8/14 - slightly better output Will follow Will new CT when output 10cc/24 hrs  Sia Gabrielsen A 05/07/2012

## 2012-05-07 NOTE — Progress Notes (Signed)
Subjective: Interval History: has complaints swellling.  Objective: Vital signs in last 24 hours: Temp:  [97 F (36.1 C)-98.3 F (36.8 C)] 98.1 F (36.7 C) (08/16 0400) Pulse Rate:  [76-117] 81  (08/16 0700) Resp:  [12-28] 19  (08/16 0700) BP: (136-174)/(69-94) 163/84 mmHg (08/16 0700) SpO2:  [98 %-100 %] 100 % (08/16 0700) Weight:  [53.1 kg (117 lb 1 oz)-54.7 kg (120 lb 9.5 oz)] 54.7 kg (120 lb 9.5 oz) (08/16 0000) Weight change: 1.2 kg (2 lb 10.3 oz)  Intake/Output from previous day: 08/15 0701 - 08/16 0700 In: 1760 [IV Piggyback:360; TPN:1390] Out: 2030 [Emesis/NG output:150; Drains:30] Intake/Output this shift: Total I/O In: -  Out: 100 [Emesis/NG output:100]  General appearance: cachectic, slowed mentation and cooperative and good conversation Resp: diminished breath sounds bilaterally and rales bibasilar Cardio: S1, S2 normal and systolic murmur: holosystolic 2/6, blowing at apex GI: few bs, wound clean, sutures is base, scaphoid Extremities: AVG L groin B&T, 1 +edema  Lab Results:  [facility] 05/07/12 0645 05/06/12 0809  WBC 13.9* 23.4*  HGB 11.7* 11.1*  HCT 36.2* 32.7*  PLT 133* 172   BMET:  Basename 05/06/12 0809 05/05/12 0635  NA 124* 132*  K 3.2* 3.4*  CL 86* 92*  CO2 20 23  GLUCOSE 116* 120*  BUN 78* 45*  CREATININE 5.69* 4.02*  CALCIUM 10.1 9.8   No results found for this basename: PTH:2 in the last 72 hours Iron Studies: No results found for this basename: IRON,TIBC,TRANSFERRIN,FERRITIN in the last 72 hours  Studies/Results: Ir Catheter Tube Change  05/05/2012  *RADIOLOGY REPORT*  Clinical history:51 year old with multiple medical problems including a small bowel leak.  The patient has a right lower quadrant drain which is incompletely draining the large abscess collection.  PROCEDURE(S): EXCHANGE AND REPOSITIONING OF THE PERCUTANEOUS DRAINAGE CATHETER WITH FLUOROSCOPY  Physician: Rachelle Hora. Henn, MD  Medications:Versed 2 mg, Fentanyl 25 mcg. A  radiology nurse monitored the patient for moderate sedation.  Moderate sedation time:22 minutes  Fluoroscopy time: 3.8 minutes  Procedure:Informed consent was obtained for a drain repositioning and exchange.  The patient was placed supine on the interventional table.  The existing catheter in the right lower quadrant was prepped and draped in a sterile fashion.  Maximal barrier sterile technique was utilized including caps, mask, sterile gowns, sterile gloves, sterile drape, hand hygiene and skin antiseptic.  Catheter was injected with contrast and the catheter was cut and removed over a Bentson wire.  A Kumpe catheter was advanced into the upper portion of the collection.  Position was confirmed with ultrasound. A 12-French biliary drain was advanced over a Bentson wire and into the superior aspect the collection.  Greater than 200 ml of dark brown fluid was removed.  Catheter sutured to the skin and attached to a gravity bag.  Findings:The old catheter was located within the inferior posterior aspect of the abscess collection.  Adjacent to the collection, there is high density material which is probably related to the small bowel fistula or leak.  Catheter and wire were advanced into the anterior and superior aspect of the collection.  Biliary drain was also advanced into the superior aspect of the collection. Greater than 200 ml of dark brown fluid was removed.  Complications: None  Impression:Successful exchange and repositioning of the drainage catheter.  The large abscess was successfully decompressed following placement of the new catheter.  Original Report Authenticated By: Richarda Overlie, M.D.   Dg Chest Port 1 View  05/05/2012  *RADIOLOGY REPORT*  Clinical Data: Chest pain.  PORTABLE CHEST - 1 VIEW  Comparison: 04/28/2012  Findings: Nasogastric tube extends at least as   the   stomach, tip not seen.  Left lower lung consolidation / atelectasis has partially improved since the previous exam.  Right lung  remains clear.  Probable small left pleural effusion.  Regional bones unremarkable.  IMPRESSION:  1.  Slight improvement in the left lower lung consolidation / atelectasis, with persistent small effusion.  Original Report Authenticated By: Osa Craver, M.D.    I have reviewed the patient's current medications.  Assessment/Plan: 1 ESRD for HD on 8/17, vol xs. BP ^. 2 HTN vol xs 3 Nutrition TNA 4 Anemia EPO, fe ok 5 S/P SBO with adhesions, s/p resx and dehisc post with now abscess, on AB, bowel rest and wound care. 6 Gout 7 HPTH 8 Severe malnutrition and debill P HD, TNA, AB, wound Care    LOS: 35 days   Adalee Kathan L 05/07/2012,8:06 AM

## 2012-05-07 NOTE — Progress Notes (Signed)
May need eakin's pouch if midline drainage increases.

## 2012-05-07 NOTE — Progress Notes (Signed)
PARENTERAL NUTRITION CONSULT NOTE - FOLLOW UP  Pharmacy Consult for TPN Indication: SBO, s/p LOA/SBR/anastamosis , s/p LOA/SBR/re-anastamosis with patch repair for leak  Allergies  Allergen Reactions  . Allopurinol     REACTION: decreased platelets  . Aspirin     REACTION: unspecified   Patient Measurements: Height: 5\' 11"  (180.3 cm) Weight: 120 lb 9.5 oz (54.7 kg) IBW/kg (Calculated) : 75.3   Vital Signs: Temp: 97.7 F (36.5 C) (08/16 0800) Temp src: Oral (08/16 0800) BP: 163/86 mmHg (08/16 0800) Pulse Rate: 79  (08/16 0800) Intake/Output from previous day: 08/15 0701 - 08/16 0700 In: 1780 [I.V.:20; IV Piggyback:360; TPN:1390] Out: 2030 [Emesis/NG output:150; Drains:30] Intake/Output from this shift: Total I/O In: 90 [I.V.:20; TPN:70] Out: 100 [Emesis/NG output:100]  Labs:  [facility] 05/07/12 0645 05/06/12 0809 05/06/12 0450  WBC 13.9* 23.4* 27.5*  HGB 11.7* 11.1* 11.4*  HCT 36.2* 32.7* 33.8*  PLT 133* 172 188  APTT -- -- --  INR -- -- --    Basename 05/06/12 0809 05/06/12 0450 05/05/12 0635  NA 124* -- 132*  K 3.2* -- 3.4*  CL 86* -- 92*  CO2 20 -- 23  GLUCOSE 116* -- 120*  BUN 78* -- 45*  CREATININE 5.69* -- 4.02*  LABCREA -- -- --  CREAT24HRUR -- -- --  CALCIUM 10.1 -- 9.8  MG -- 1.6 --  PHOS 3.6 3.6 3.0  PROT -- -- --  ALBUMIN 1.7* -- --  AST -- -- --  ALT -- -- --  ALKPHOS -- -- --  BILITOT -- -- --  BILIDIR -- -- --  IBILI -- -- --  PREALBUMIN -- -- --  TRIG -- -- --  CHOLHDL -- -- --  CHOL -- -- --   Estimated Creatinine Clearance: 11.7 ml/min (by C-G formula based on Cr of 5.69).   Basename 05/07/12 0735 05/07/12 0455 05/07/12 0011  GLUCAP 99 123* 130*   Insulin Requirements in the past 12 hours:  6 units SSI + 20 units insulin R in TNA.   Nutritional Goals:  1700-1950 kCal, 75-85 grams of protein per day  Current Nutrition:  Clinimix 5/15 at 70 ml/hr with 20% lipids at 10 ml/hr MWF provides an average of 1399 kcal and 84g  protein per day  Admit:  Admitted 7/13 with abd pain, weight loss, SBO. S/p LOA, SBR 724 and repeat LOA, SBR with anasomotic leak repair 8/1 , thrombectomy with AVG revision 7/25 and repeat thrombectomy 8/6.  GI: Has been essentially NPO since admit, therefore at significant risk for re-feeding, but has been tolerating TPN thus far. NGT output down to 150cc/24h yesterday.  Still with some wound drainage output. Fluid removed from abscess and drain upsized on 8/14.  Endo:  CBGs/12h 99-130 (goal < 150).On moderate SSI q4 + 20 units insulin R in TPN (decreased yesterday). On Solu-Cortef-25mg  bid.  Lytes:  Na 124 << 132-- low likely d/t volume overload, unable to tolerate UF d/t low bp, and Clinimix without supplemented lytes. Corrected Ca~11.8,slightly high despite no lytes in TNA. Phos 3.6, Ca x PO4 product <55. K 3.2. Goals for lytes with ileus are K~4, Mg~2, and Phos~3.5  Renal:  ESRD w/HD typically T/TH/Sat - currently receiving daily HD d/t fluid overload with ongoing TPN. Difficulty tolerating UF d/t low BPs. On Nulecit, Aranesp-Sat  Pulm: Extubated on 8/1; 100% on RA  Cards: BP elevated (BP/24h: 136-186/81-94) -- however becomes hypotensive during HD sessions and unable to tolerate UF. HR wnl-tachy (70-117). C/o CP on 8/14  unrelieved with NTG x3 doses, tropnin 1.76 -- thought to be related to demand ischemia. No further CP this AM.  On scheduled lopressor and prn hydralazine.  Hepatobil: LFTs, trig and chol WNL.  Alkphos and tbil are elevated. Albumin is low. Prealbumin is at goal.  Neuro: GCS 15,  Pain 0-3/10. On fent patch, prn pain meds.  ID: Micafugin + Zosyn for peritonitis/abd abscess -- ID considering adding Vanc but really wants new cultures. Afeb, WBC now trending downward.   Abd abscess culture: yeast. Drain repositioned and removed 200 cc of brown fluid from abscess on 8/14 -- not sent for culturing. Best Practices:  Mouthcare, PPI-IV  Plan:  1. Continue Clinimix 5/15 at 70  ml/hr, which meets protein goal but is just below kcal. 2.  Will decrease insulin back to 20 units/bag in TPN tonight 3.  Provide available trace elements, MVI and lipids @10cc /hr on MWF only d/t national backorder. 4. Will f/u TNA labs  Nadara Mustard, PharmD., MS Clinical Pharmacist Pager:  [phone]  Thank you for allowing pharmacy to be part of this patients care team. 05/07/2012 8:37 AM

## 2012-05-07 NOTE — Progress Notes (Signed)
[facility] PROGRESS NOTE  [name]:[id number] DOB: Jun 09, 1961 DOA: 04/02/2012 PCP: Trevor Iha, MD  Assessment/Plan: SBO (small bowel obstruction) s/p lysis of adhesions and partial small bowel resection 7/24 *Subsequent anastomotic leak- omental patch of anastomosis 8/2  *CT abdomen and pelvis 04/29/2012 demonstrated several fluid collections consistent with intra-abdominal abscess with the largest measuring 16.5 x 13.7 x 6.4 cm.  *Patient underwent percutaneous drainage of largest fluid collection 04/30/2012 * Repeat CT done for increasing WBC count on 8/13 reveals enlarging abscess and enteric fistula and obstructed drainage tube. 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 *An attempt was made on 04/21/2012 to place a central line in interventional radiology under fluoroscopy but unfortunately central venous access could not be placed due to upper extremity and internal jugular vein access being chronically occluded bilaterally therefore rationale for why we have to continue to use this patient's right femoral Vas-Cath port for parenteral nutrition  Chronic nonocclusive left lower extremity DVT Diagnosed via Doppler 04/30/2012  Ileus following gastrointestinal surgery *Cont TNA via right femoral Vas-Cath port per surgery  Atypical chest pain/abnormal cardiac enzyme *Having episodes of severe chest pain during this hospitalization. CT of the chest revealed no evidence of pulmonary embolism or aortic dissection *Suspect mildly elevated cardiac isoenzymes related to  demand ischemia versus recent stressors of recurrent surgical procedures  CKD (chronic kidney disease) stage V requiring chronic dialysis *Dialysis per nephrology  Hypokalemia/hypophosphatemia/hyponatremia *Management per nephrology service/TNA  Thrombocytopenia *Resolved  Normocytic Anemia  *Stable. Due to acute blood loss and due to chronic disease *Received packed red blood cells on 04/25/2012 *Cont aranesp and ferric gluconate  HTN (hypertension) Fair control- IV Metoprolol  Chronic use of steroids Steroid being slowly tapered down  Malnutrition, calorie *Patient cachectic at presentation  *Parenteral nutrition managed by surgery and pharmacy  Leg dialysis graft occlusion *s/p thrombectomy 7/25 - unfortunately re clotted-has undergone an additional thrombectomy procedure with revision of arterial and graft on 04/27/2012 *Was being dialyzed via HD cath in right groin but now graft is being used again  DVT prophylaxis: SCDs Code Status: full  Disposition Plan: Remain in step down  Consultants: General Surgery [facility] Infectious disease  Events since admission: 7/13 CT Abdomen with SBO  7/24 Ex-lap, lysis of adhesions, partial small bowel resection x2  7/25 Thrombectomy L femoral AV graft  7/26 Extubated, CVL is out  7/27 Transferred to telemetry under [facility]  8/2 Acute abdomen, dehiscence of anastomosis, SBO, back from OR intubated in ICU on PCCM service. Found to have moderate peritonitis infraumbilically 8/2 Extubated  Antibiotics: Zosyn 8/1 >>> Micafungin 8/3 >> Invanz 7/24- periop Fluconazole- 8/2- one dose Vancomycin 8/8 >>> 05/01/2012  HPI/Subjective: No complaints- flat affect.   Objective: Filed Vitals:   05/07/12 0610 05/07/12 0700 05/07/12 0800 05/07/12 1100  BP: 174/94 163/84 163/86 156/76  Pulse: 76 81 79 70  Temp:   97.7 F (36.5 C) 97.5 F (36.4 C)  TempSrc:   Oral Oral  Resp: 15 19 16 16   Height:      Weight:      SpO2: 100%  100% 100% 100%    Intake/Output Summary (Last 24 hours) at 05/07/12 1442  Last data filed at 05/07/12 1100  Gross per 24 hour  Intake   1965 ml  Output    270 ml  Net   1695 ml    Exam:   General:  Alert, appears calm, in no acute distress  Cardiovascular: RRR, no m/r/g. 2+ LLE edeam  Respiratory: CTA bilaterally, no w/r/r. Normal respiratory effort.  Data Reviewed: Basic Metabolic Panel:  Lab 05/07/12 0865 05/06/12 0809 05/06/12 0450 05/05/12 0635 05/04/12 0345 05/03/12 0623 05/01/12 1002  NA 128* 124* -- 132* 134* 128* --  K 3.9 3.2* -- 3.4* 4.0 3.3* --  CL 91* 86* -- 92* 97 92* --  CO2 22 20 -- 23 25 20  --  GLUCOSE 115* 116* -- 120* 141* 132* --  BUN 51* 78* -- 45* 45* 76* --  CREATININE 4.25* 5.69* -- 4.02* 4.02* 5.67* --  CALCIUM 9.5 10.1 -- 9.8 9.9 9.8 --  MG -- -- 1.6 -- 1.7 -- --  PHOS -- 3.6 3.6 3.0 2.0* -- 2.7   Liver Function Tests:  Lab 05/06/12 0809 05/03/12 0623 05/01/12 1002  AST -- 21 --  ALT -- 23 --  ALKPHOS -- 119* --  BILITOT -- 1.5* --  PROT -- 5.4* --  ALBUMIN 1.7* 1.5* 1.5*   CBC:  Lab 05/07/12 0930 05/07/12 0645 05/06/12 0809 05/06/12 0450 05/05/12 0635 05/03/12 0535  WBC 15.5* 13.9* 23.4* 27.5* 26.4* --  NEUTROABS -- -- -- -- -- 23.2*  HGB 11.8* 11.7* 11.1* 11.4* 11.3* --  HCT 35.6* 36.2* 32.7* 33.8* 34.6* --  MCV 88.1 87.2 84.1 85.1 88.9 --  PLT 136* 133* 172 188 166 --   CBG:  Lab 05/07/12 1144 05/07/12 0735 05/07/12 0455 05/07/12 0011 05/06/12 1947  GLUCAP 119* 99 123* 130* 123*    Recent Results (from the past 240 hour(s))  CULTURE, BLOOD (ROUTINE X 2)     Status: Normal   Collection Time   04/28/12  3:38 PM      Component Value Range Status Comment   Specimen Description BLOOD RIGHT HAND   Final    Special Requests BOTTLES DRAWN AEROBIC ONLY 5CC   Final    Culture  Setup Time 04/28/2012 22:13   Final    Culture NO GROWTH 5 DAYS   Final    Report Status 05/04/2012 FINAL   Final   CULTURE, BLOOD (ROUTINE X 2)     Status:  Normal   Collection Time   04/28/12  3:48 PM      Component Value Range Status Comment   Specimen Description BLOOD RIGHT ARM   Final    Special Requests BOTTLES DRAWN AEROBIC ONLY 5CC   Final    Culture  Setup Time 04/28/2012 22:12   Final    Culture NO GROWTH 5 DAYS   Final    Report Status 05/04/2012 FINAL   Final   ANAEROBIC CULTURE     Status: Normal   Collection Time   04/30/12  5:15 PM      Component Value Range Status Comment   Specimen Description ABSCESS ABDOMEN   Final    Special Requests 4 SYRINGES @ 60CC   Final    Gram Stain     Final    Value: ABUNDANT WBC PRESENT, PREDOMINANTLY PMN     NO SQUAMOUS EPITHELIAL CELLS SEEN     ABUNDANT GRAM POSITIVE COCCI IN PAIRS     IN CLUSTERS   Culture NO ANAEROBES ISOLATED   Final    Report  Status 05/05/2012 FINAL   Final   FUNGUS CULTURE W SMEAR     Status: Normal (Preliminary result)   Collection Time   04/30/12  5:15 PM      Component Value Range Status Comment   Specimen Description ABSCESS ABDOMEN   Final    Special Requests 4 SYRINGES @ 60CC   Final    Fungal Smear NO YEAST OR FUNGAL ELEMENTS SEEN   Final    Culture CANDIDA TROPICALIS   Final    Report Status PENDING   Incomplete   CULTURE, ROUTINE-ABSCESS     Status: Normal   Collection Time   04/30/12  5:15 PM      Component Value Range Status Comment   Specimen Description ABSCESS ABDOMEN   Final    Special Requests 4 SYRINGES @ 60CC   Final    Gram Stain     Final    Value: ABUNDANT WBC PRESENT, PREDOMINANTLY PMN     NO SQUAMOUS EPITHELIAL CELLS SEEN     ABUNDANT GRAM POSITIVE COCCI IN PAIRS     IN CLUSTERS   Culture FEW YEAST CONSISTENT WITH CANDIDA SPECIES   Final    Report Status 05/04/2012 FINAL   Final     Scheduled Meds: reviewed Continuous Infusions: reviewed _________________________________________________________________  05/07/2012, 2:42 PM  LOS: 35 days    Calvert Cantor, MD [facility] Team 1 [phone]

## 2012-05-07 NOTE — Progress Notes (Signed)
Hydrotherapy Note:   05/07/12 0824  Subjective Assessment  Subjective Pt supine without any c/o during session.  Patient and Family Stated Goals Get well.  Date of Onset 04/02/12  Prior Treatments Dressing changes  Evaluation and Treatment  Evaluation and Treatment Procedures Explained to Patient/Family Yes  Evaluation and Treatment Procedures agreed to  Wound 04/27/12 Other (Comment) Abdomen Mid Midline abdominal incisional wound  Date First Assessed/Time First Assessed: 04/27/12 1326   Wound Type: Other (Comment)  Location: Abdomen  Location Orientation: Mid  Wound Description (Comments): Midline abdominal incisional wound  Site / Wound Assessment Red;Yellow;Brown (Distal bile leaking noted.  RN aware, who has informed MD.)  % Wound base Red or Granulating 60%  % Wound base Yellow 40%  Peri-wound Assessment Intact  Margins Unattacted edges (unapproximated)  Closure Sutures  Drainage Amount Moderate  Drainage Description Serosanguineous;Other (Comment);No odor (Brown drainage due to distal bile leaking.)  Non-staged Wound Description Full thickness  Treatment Hydrotherapy (Pulse lavage);Debridement (Selective);Packing (Saline gauze) (NS W to D 4x4, dry 4x4, abd, skin prep, tape)  Dressing Type Moist to dry (NS W to D 4x4, dry 4x4, abd, skin prep, tape)  Dressing Changed New  Dressing Status Clean  Hydrotherapy  Pulsed Lavage with Suction (psi) 4 psi  Pulsed Lavage with Suction - Normal Saline Used 1000 mL  Pulsed Lavage Tip Tip with splash shield  Pulsed lavage therapy - wound location Abdomen  Selective Debridement  Selective Debridement - Location Abdomen  Selective Debridement - Tools Used Forceps;Scissors  Selective Debridement - Tissue Removed Yellow slough.  Wound Therapy - Assess/Plan/Recommendations  Wound Therapy - Clinical Statement Pt is a 51 y/o male with an abdominal midline incisional wound.  Pt continues to benefit from hydrotherapy by decreasing necrotic  tissue/increasing granulation tissue.  Still noted distal bile leaking from wound.  RN aware and reports discussing this with surgery.  Will continue to monitor and assist with wound healing as able.  Wound Therapy - Functional Problem List Decreased skin integrity causing increased chance for infection.  Factors Delaying/Impairing Wound Healing Infection - systemic/local;Immobility;Multiple medical problems;Polypharmacy  Hydrotherapy Plan Debridement;Dressing change;Patient/family education;Pulsatile lavage with suction  Wound Therapy - Frequency 6X / week  Wound Therapy - Current Recommendations PT;OT;Surgery consult;WOC nurse  Wound Therapy - Follow Up Recommendations Skilled nursing facility;Home health RN (? if pt will be deconditioned requiring STSNF vs HHRN.)  Wound Plan Pulse lavage, debridement, dressing change 6x/week.  Wound Therapy Goals - Improve the function of patient's integumentary system by progressing the wound(s) through the phases of wound healing by:  Decrease Necrotic Tissue - Progress Progressing toward goal  Increase Granulation Tissue - Progress Progressing toward goal  Improve Drainage Characteristics - Progress Progressing toward goal  Goals/treatment plan/discharge plan were made with and agreed upon by patient/family Yes  Wound Therapy - Potential for Goals Good    Pain: No signs/symptoms throughout.  05/07/2012 Cephus Shelling, PT, DPT [phone]

## 2012-05-08 ENCOUNTER — Inpatient Hospital Stay (HOSPITAL_COMMUNITY): Payer: Medicare Other

## 2012-05-08 LAB — CBC
MCH: 28.3 pg (ref 26.0–34.0)
MCHC: 33.2 g/dL (ref 30.0–36.0)
MCV: 85 fL (ref 78.0–100.0)
Platelets: 156 10*3/uL (ref 150–400)

## 2012-05-08 LAB — COMPREHENSIVE METABOLIC PANEL
ALT: 23 U/L (ref 0–53)
AST: 24 U/L (ref 0–37)
CO2: 20 mEq/L (ref 19–32)
Calcium: 9.8 mg/dL (ref 8.4–10.5)
Chloride: 88 mEq/L — ABNORMAL LOW (ref 96–112)
Creatinine, Ser: 5.63 mg/dL — ABNORMAL HIGH (ref 0.50–1.35)
GFR calc Af Amer: 12 mL/min — ABNORMAL LOW (ref >90)
GFR calc non Af Amer: 10 mL/min — ABNORMAL LOW (ref >90)
Glucose, Bld: 89 mg/dL (ref 70–99)
Sodium: 126 mEq/L — ABNORMAL LOW (ref 135–145)
Total Bilirubin: 1.7 mg/dL — ABNORMAL HIGH (ref 0.3–1.2)

## 2012-05-08 LAB — GLUCOSE, CAPILLARY
Glucose-Capillary: 107 mg/dL — ABNORMAL HIGH (ref 70–99)
Glucose-Capillary: 124 mg/dL — ABNORMAL HIGH (ref 70–99)

## 2012-05-08 MED ORDER — DARBEPOETIN ALFA-POLYSORBATE 200 MCG/0.4ML IJ SOLN
INTRAMUSCULAR | Status: AC
  Administered 2012-05-08: 200 ug via INTRAVENOUS
  Filled 2012-05-08: qty 0.4

## 2012-05-08 MED ORDER — HEPARIN SODIUM (PORCINE) 1000 UNIT/ML DIALYSIS
2500.0000 [IU] | INTRAMUSCULAR | Status: DC | PRN
  Filled 2012-05-08: qty 3

## 2012-05-08 MED ORDER — INSULIN REGULAR HUMAN 100 UNIT/ML IJ SOLN
INTRAVENOUS | Status: AC
  Administered 2012-05-08: 17:00:00 via INTRAVENOUS
  Filled 2012-05-08: qty 2000

## 2012-05-08 MED ORDER — MORPHINE SULFATE 2 MG/ML IJ SOLN
INTRAMUSCULAR | Status: AC
  Administered 2012-05-08: 2 mg via INTRAVENOUS
  Filled 2012-05-08: qty 1

## 2012-05-08 NOTE — Progress Notes (Signed)
PARENTERAL NUTRITION CONSULT NOTE - FOLLOW UP  Pharmacy Consult for TPN Indication: SBO, s/p LOA/SBR/anastamosis , s/p LOA/SBR/re-anastamosis with patch repair for leak  Allergies  Allergen Reactions  . Allopurinol     REACTION: decreased platelets  . Aspirin     REACTION: unspecified   Patient Measurements: Height: 5\' 11"  (180.3 cm) Weight: 120 lb 9.5 oz (54.7 kg) IBW/kg (Calculated) : 75.3   Vital Signs: Temp: 97.7 F (36.5 C) (08/17 0355) Temp src: Oral (08/17 0355) BP: 145/69 mmHg (08/17 0355) Pulse Rate: 68  (08/17 0355) Intake/Output from previous day: 08/16 0701 - 08/17 0700 In: 1820 [I.V.:450; IV Piggyback:260; TPN:1100] Out: 555 [Emesis/NG output:460; Drains:95]  Labs:  [facility] 05/07/12 0930 05/07/12 0645 05/06/12 0809  WBC 15.5* 13.9* 23.4*  HGB 11.8* 11.7* 11.1*  HCT 35.6* 36.2* 32.7*  PLT 136* 133* 172  APTT -- -- --  INR -- -- --    Basename 05/07/12 0930 05/06/12 0809 05/06/12 0450  NA 128* 124* --  K 3.9 3.2* --  CL 91* 86* --  CO2 22 20 --  GLUCOSE 115* 116* --  BUN 51* 78* --  CREATININE 4.25* 5.69* --  CALCIUM 9.5 10.1 --  MG -- -- 1.6  PHOS -- 3.6 3.6  ALBUMIN -- 1.7* --   Estimated Creatinine Clearance: 15.7 ml/min (by C-G formula based on Cr of 4.25).   Basename 05/08/12 0354 05/08/12 05/07/12 1948  GLUCAP 106* 107* 139*   Insulin Requirements in the past 12 hours:  2 units SSI + 20 units insulin R in TNA.   Nutritional Goals:  1700-1950 kCal, 75-85 grams of protein per day  Current Nutrition:  Clinimix 5/15 at 70 ml/hr with 20% lipids at 10 ml/hr MWF provides an average of 1399 kcal and 84g protein per day  Admit:  Admitted 7/13 with abd pain, weight loss, SBO. S/p LOA, SBR 724 and repeat LOA, SBR with anasomotic leak repair 8/1 , thrombectomy with AVG revision 7/25 and repeat thrombectomy 8/6.  CT on 8/13 reveals enlarging abscess and enteric fistula.  GI: Has been essentially NPO since admit, therefore at significant risk for  re-feeding, but has been tolerating TPN thus far. NGT output 260cc/24h.  Still with some wound drainage output. Fluid removed from RLQ drain (85ml).  Endo:  CBGs/12h 106-139 (goal < 150).  On moderate SSI q4 + 20 units insulin R in TPN. He also continues on Solu-Cortef-25mg  bid.  Lytes:  No new labs today - some mild hyponatremia yesterday but improving.  He is on HD but unable to tolerate UF d/t low bp, and Clinimix without supplemented lytes. Goals for lytes with ileus are K~4, Mg~2, and Phos~3.5.    Renal:  ESRD w/HD typically T/TH/Sat - currently receiving daily HD d/t fluid overload with ongoing TPN. Difficulty tolerating UF d/t low BPs. On Nulecit, Aranesp-Sat  Pulm: Extubated on 8/1; 100% on RA  Cards: BP elevated (BP/24h: 136-186/81-94) -- however becomes hypotensive during HD sessions and unable to tolerate UF. HR wnl-tachy (70-117). C/o CP on 8/14 unrelieved with NTG x3 doses, tropnin 1.76 -- thought to be related to demand ischemia. No further CP this AM.  On scheduled lopressor and prn hydralazine.  Hepatobil: LFTs, trig and chol WNL.  Alkphos and tbil are elevated. Albumin is low. Prealbumin is at goal.  Repeat labs on Monday for f/u.  Neuro: GCS 15,  Pain 0-3/10. On fent patch, prn pain meds.  ID: Micafugin + Zosyn for peritonitis/abd abscess -- ID considering adding Vanc  but really wants new cultures. Afeb, WBC now trending downward.   Abd abscess culture: yeast. Drain repositioned and removed 200 cc of brown fluid from abscess on 8/14 -- not sent for culturing. Best Practices:  Mouthcare, PPI-IV  Plan:  1. Continue Clinimix 5/15 at 70 ml/hr, which meets protein goal but is just below kcal. 2.  Continue insulin at 20 units/bag in TPN and monitor closely 3.  Provide available trace elements, MVI and lipids @10cc /hr on MWF only d/t national backorder. 4. Will f/u TNA labs  Nadara Mustard, PharmD., MS Clinical Pharmacist Pager:  [phone]  Thank you for allowing pharmacy to  be part of this patients care team. 05/08/2012 7:21 AM

## 2012-05-08 NOTE — Procedures (Signed)
I was present at this dialysis session. I have reviewed the session itself and made appropriate changes.   Rob Waymond Meador, MD [facility] 05/08/2012, 10:15 AM   

## 2012-05-08 NOTE — Progress Notes (Signed)
Subjective: Interval History: has complaints swellling.  Objective: Vital signs in last 24 hours: Temp:  [97.2 F (36.2 C)-97.9 F (36.6 C)] 97.2 F (36.2 C) (08/17 0700) Pulse Rate:  [68-123] 123  (08/17 0900) Resp:  [11-21] 14  (08/17 0900) BP: (130-175)/(64-91) 130/64 mmHg (08/17 0900) SpO2:  [100 %] 100 % (08/17 0700) Weight:  [58.1 kg (128 lb 1.4 oz)] 58.1 kg (128 lb 1.4 oz) (08/17 0700) Weight change: 1.5 kg (3 lb 4.9 oz)  Intake/Output from previous day: 08/16 0701 - 08/17 0700 In: 1820 [I.V.:450; IV Piggyback:260; TPN:1100] Out: 555 [Emesis/NG output:460; Drains:95] Intake/Output this shift:    General appearance: cachectic, slowed mentation and cooperative and good conversation Resp: diminished breath sounds bilaterally and rales bibasilar Cardio: S1, S2 normal and systolic murmur: holosystolic 2/6, blowing at apex GI: few bs, wound clean, sutures is base, scaphoid Extremities: AVG L groin B&T, 2+ unilateral L leg edema  Lab Results:  Basename 05/08/12 0830 05/07/12 0930  WBC 13.9* 15.5*  HGB 11.5* 11.8*  HCT 34.6* 35.6*  PLT 156 136*   BMET:   Basename 05/08/12 0830 05/07/12 0930  NA 126* 128*  K 3.3* 3.9  CL 88* 91*  CO2 20 22  GLUCOSE 89 115*  BUN 77* 51*  CREATININE 5.63* 4.25*  CALCIUM 9.8 9.5   No results found for this basename: PTH:2 in the last 72 hours Iron Studies: No results found for this basename: IRON,TIBC,TRANSFERRIN,FERRITIN in the last 72 hours  Studies/Results: No results found.  I have reviewed the patient's current medications.  Assessment/Plan: 1. ESRD for HD on 8/17, vol xs. BP ^. HD again Mon to decrease vol 2. L leg swelling, new > shuntogram Mon 3. HTN vol xs 4. Nutrition TNA 5. Anemia EPO, fe ok 6. S/P SBO with adhesions, s/p resx and dehisc post with now abscess w drain, on AB, bowel rest and wound care. 7. Gout 8. HPTH 9. Severe malnutrition and debill  P HD, TNA, AB, wound care   LOS: 36 days    Jahnia Hewes D 05/08/2012,10:03 AM

## 2012-05-08 NOTE — Progress Notes (Signed)
Subjective: Pt seen in HD No sig pain C/o new LLE edema. He states this has been off and on since thrombectomy and revision by [facility] several weeks ago. Currently on HD at present, does not appear to be having issue with access/flows  Objective: Physical Exam: BP 124/59  Pulse 112  Temp 97.2 F (36.2 C) (Oral)  Resp 17  Ht 5\' 11"  (1.803 m)  Wt 128 lb 1.4 oz (58.1 kg)  BMI 17.86 kg/m2  SpO2 99% (R)LQ drian intact, greenish output, 95 cc recorded yesterday (L)LE 2+ edema noted  Labs: CBC  Basename 05/08/12 0830 05/07/12 0930  WBC 13.9* 15.5*  HGB 11.5* 11.8*  HCT 34.6* 35.6*  PLT 156 136*   BMET  Basename 05/08/12 0830 05/07/12 0930  NA 126* 128*  K 3.3* 3.9  CL 88* 91*  CO2 20 22  GLUCOSE 89 115*  BUN 77* 51*  CREATININE 5.63* 4.25*  CALCIUM 9.8 9.5   LFT  Basename 05/08/12 0830  PROT 5.5*  ALBUMIN 1.6*  AST 24  ALT 23  ALKPHOS 118*  BILITOT 1.7*  BILIDIR --  IBILI --  LIPASE --   PT/INR No results found for this basename: LABPROT:2,INR:2 in the last 72 hours   Studies/Results: No results found.  Assessment/Plan: abd abscess drain intact  Up sized 8/14 - slightly better output  Will follow  Will new CT when output 10cc/24 hrs  Shuntogram of (L)thigh graft ordered for Mon.    LOS: 36 days    Brayton El PA-C 05/08/2012 10:31 AM

## 2012-05-08 NOTE — Progress Notes (Signed)
11 Days Post-Op  Subjective: In HD, no significant pain  Objective: Vital signs in last 24 hours: Temp:  [97.5 F (36.4 C)-97.9 F (36.6 C)] 97.7 F (36.5 C) (08/17 0355) Pulse Rate:  [68-72] 68  (08/17 0355) Resp:  [11-21] 12  (08/17 0355) BP: (145-162)/(69-82) 145/69 mmHg (08/17 0355) SpO2:  [100 %] 100 % (08/17 0355) Last BM Date:  (unable to verbalize last bm)  Intake/Output from previous day: 08/16 0701 - 08/17 0700 In: 1820 [I.V.:450; IV Piggyback:260; TPN:1100] Out: 555 [Emesis/NG output:460; Drains:95] Intake/Output this shift:    General appearance: alert and cooperative Resp: clear to auscultation bilaterally Cardio: regular rate and rhythm GI: soft, +BS, Eakins pouch with enteric output - well controlled  Lab Results:   Basename 05/07/12 0930 05/07/12 0645  WBC 15.5* 13.9*  HGB 11.8* 11.7*  HCT 35.6* 36.2*  PLT 136* 133*   BMET  Basename 05/07/12 0930 05/06/12 0809  NA 128* 124*  K 3.9 3.2*  CL 91* 86*  CO2 22 20  GLUCOSE 115* 116*  BUN 51* 78*  CREATININE 4.25* 5.69*  CALCIUM 9.5 10.1   PT/INR No results found for this basename: LABPROT:2,INR:2 in the last 72 hours ABG No results found for this basename: PHART:2,PCO2:2,PO2:2,HCO3:2 in the last 72 hours  Studies/Results: No results found.  Anti-infectives: Anti-infectives     Start     Dose/Rate Route Frequency Ordered Stop   05/08/12 1200   fluconazole (DIFLUCAN) IVPB 400 mg        400 mg 200 mL/hr over 60 Minutes Intravenous Every T-Th-Sa (Hemodialysis) 05/07/12 1752     04/30/12 1400   vancomycin (VANCOCIN) 750 mg in sodium chloride 0.9 % 150 mL IVPB  Status:  Discontinued        750 mg 150 mL/hr over 60 Minutes Intravenous  Once 04/30/12 1212 04/30/12 1507   04/29/12 2330   vancomycin (VANCOCIN) 1,500 mg in sodium chloride 0.9 % 500 mL IVPB        1,500 mg 250 mL/hr over 120 Minutes Intravenous  Once 04/29/12 2259 04/30/12 0246   04/27/12 0000   ceFAZolin (ANCEF) IVPB 1 g/50 mL  premix  Status:  Discontinued     Comments: Send with pt to OR      1 g 100 mL/hr over 30 Minutes Intravenous On call 04/26/12 0927 04/26/12 0928   04/24/12 1000   micafungin (MYCAMINE) 100 mg in sodium chloride 0.9 % 100 mL IVPB  Status:  Discontinued        100 mg 100 mL/hr over 1 Hours Intravenous Daily 04/24/12 0750 05/07/12 1713   04/23/12 1600   fluconazole (DIFLUCAN) IVPB 400 mg        400 mg 200 mL/hr over 60 Minutes Intravenous  Once 04/23/12 1428 04/23/12 1800   04/22/12 2200   piperacillin-tazobactam (ZOSYN) IVPB 3.375 g  Status:  Discontinued        3.375 g 100 mL/hr over 30 Minutes Intravenous 3 times per day 04/22/12 2120 04/22/12 2137   04/22/12 2200   piperacillin-tazobactam (ZOSYN) IVPB 2.25 g        2.25 g 100 mL/hr over 30 Minutes Intravenous 3 times per day 04/22/12 2140     04/22/12 2157   gentamicin (GARAMYCIN) injection  Status:  Discontinued          As needed 04/22/12 2158 04/22/12 2335   04/22/12 2156   clindamycin (CLEOCIN) injection  Status:  Discontinued          As  needed 04/22/12 2157 04/22/12 2335   04/22/12 2145   piperacillin-tazobactam (ZOSYN) IVPB 3.375 g        3.375 g 12.5 mL/hr over 240 Minutes Intravenous  Once 04/22/12 2141 04/23/12 0145   04/22/12 2130   gentamicin (GARAMYCIN) 240 mg, clindamycin (CLEOCIN) 900 mg in sodium chloride irrigation 0.9 % 1,000 mL irrigation  Status:  Discontinued         Irrigation  Once 04/22/12 2119 04/23/12 0020   04/14/12 0830   ertapenem (INVANZ) 1 g in sodium chloride 0.9 % 50 mL IVPB        1 g 100 mL/hr over 30 Minutes Intravenous To Surgery 04/14/12 0816 04/14/12 0834   04/13/12 1359   ertapenem (INVANZ) 1 g in sodium chloride 0.9 % 50 mL IVPB  Status:  Discontinued        1 g 100 mL/hr over 30 Minutes Intravenous 60 min pre-op 04/13/12 1359 04/14/12 0816          Assessment/Plan: s/p Procedure(s) (LRB): THROMBECTOMY ARTERIOVENOUS GORE-TEX GRAFT (Left) REVISION OF ARTERIOVENOUS GORETEX  GRAFT (Left) S/P SBR with EC fistula - continue Eakins pouch and IR drain ID - Zosyn FEN - TNA ESRD - per renal  LOS: 36 days    [name] 05/08/2012

## 2012-05-08 NOTE — Progress Notes (Signed)
[facility] PROGRESS NOTE  DAYVIAN BLIXT GNF:[id number] DOB: 1961/06/08 DOA: 04/02/2012 PCP: Trevor Iha, MD  Assessment/Plan: SBO (small bowel obstruction) s/p lysis of adhesions and partial small bowel resection 7/24 *Subsequent anastomotic leak- omental patch of anastomosis 8/2  *CT abdomen and pelvis 04/29/2012 demonstrated several fluid collections consistent with intra-abdominal abscess with the largest measuring 16.5 x 13.7 x 6.4 cm.  *Patient underwent percutaneous drainage of largest fluid collection 04/30/2012 * Repeat CT done for increasing WBC count on 8/13 reveals enlarging abscess and enteric fistula and obstructed drainage tube. 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 *An attempt was made on 04/21/2012 to place a central line in interventional radiology under fluoroscopy but unfortunately central venous access could not be placed due to upper extremity and internal jugular vein access being chronically occluded bilaterally therefore rationale for why we have to continue to use this patient's right femoral Vas-Cath port for parenteral nutrition  Chronic nonocclusive left lower extremity DVT Diagnosed via Doppler 04/30/2012  Ileus following gastrointestinal surgery *Cont TNA via right femoral Vas-Cath port per surgery  Atypical chest pain/abnormal cardiac enzyme *Having episodes of severe chest pain during this hospitalization. CT of the chest revealed no evidence of pulmonary embolism or aortic dissection *Suspect mildly elevated  cardiac isoenzymes related to demand ischemia versus recent stressors of recurrent surgical procedures  CKD (chronic kidney disease) stage V requiring chronic dialysis *Dialysis per nephrology  Hypokalemia/hypophosphatemia/hyponatremia *Management per nephrology service/TNA  Thrombocytopenia *Resolved  Normocytic Anemia  *Stable. Due to acute blood loss and due to chronic disease *Received packed red blood cells on 04/25/2012 *Cont aranesp and ferric gluconate  HTN (hypertension) Fair control- IV Metoprolol  Chronic use of steroids Steroid being slowly tapered down  Malnutrition, calorie *Patient cachectic at presentation  *Parenteral nutrition managed by surgery and pharmacy  Leg dialysis graft occlusion *s/p thrombectomy 7/25 - unfortunately re clotted-has undergone an additional thrombectomy procedure with revision of arterial and graft on 04/27/2012 *Was being dialyzed via HD cath in right groin but now graft is being used again  DVT prophylaxis: SCDs Code Status: full  Disposition Plan: Remain in step down  Consultants: General Surgery [facility] Infectious disease  Events since admission: 7/13 CT Abdomen with SBO  7/24 Ex-lap, lysis of adhesions, partial small bowel resection x2  7/25 Thrombectomy L femoral AV graft  7/26 Extubated, CVL is out  7/27 Transferred to telemetry under [facility]  8/2 Acute abdomen, dehiscence of anastomosis, SBO, back from OR intubated in ICU on PCCM service. Found to have moderate peritonitis infraumbilically 8/2 Extubated  Antibiotics: Zosyn 8/1 >>> Micafungin 8/3 >> Invanz 7/24- periop Fluconazole- 8/2- one dose Vancomycin 8/8 >>> 05/01/2012  HPI/Subjective: No complaints- flat affect.   Objective: Filed Vitals:   05/08/12 1130 05/08/12 1142 05/08/12 1257 05/08/12 1623  BP: 119/53 127/67 125/70 145/77  Pulse: 114 91  92  Temp:  96.5 F (35.8 C) 98.2 F (36.8 C) 98.9 F (37.2 C)  TempSrc:  Oral Axillary Oral  Resp:  18 20  18   Height:      Weight:  54 kg (119 lb 0.8 oz)    SpO2:  100%  98%    Intake/Output Summary (Last 24 hours) at 05/08/12 1633 Last data filed at 05/08/12 1600  Gross per 24 hour  Intake   1245 ml  Output   4045 ml  Net  -2800 ml    Exam:   General:  Alert, appears calm, in no acute distress  Cardiovascular: RRR, no m/r/g. 2+ LLE edeam  Respiratory: CTA bilaterally, no w/r/r. Normal respiratory effort.  Data Reviewed: Basic Metabolic Panel:  Lab 05/08/12 4098 05/07/12 0930 05/06/12 0809 05/06/12 0450 05/05/12 0635 05/04/12 0345  NA 126* 128* 124* -- 132* 134*  K 3.3* 3.9 3.2* -- 3.4* 4.0  CL 88* 91* 86* -- 92* 97  CO2 20 22 20  -- 23 25  GLUCOSE 89 115* 116* -- 120* 141*  BUN 77* 51* 78* -- 45* 45*  CREATININE 5.63* 4.25* 5.69* -- 4.02* 4.02*  CALCIUM 9.8 9.5 10.1 -- 9.8 9.9  MG -- -- -- 1.6 -- 1.7  PHOS 3.1 -- 3.6 3.6 3.0 2.0*   Liver Function Tests:  Lab 05/08/12 0830 05/06/12 0809 05/03/12 0623  AST 24 -- 21  ALT 23 -- 23  ALKPHOS 118* -- 119*  BILITOT 1.7* -- 1.5*  PROT 5.5* -- 5.4*  ALBUMIN 1.6* 1.7* 1.5*   CBC:  Lab 05/08/12 0830 05/07/12 0930 05/07/12 0645 05/06/12 0809 05/06/12 0450 05/03/12 0535  WBC 13.9* 15.5* 13.9* 23.4* 27.5* --  NEUTROABS -- -- -- -- -- 23.2*  HGB 11.5* 11.8* 11.7* 11.1* 11.4* --  HCT 34.6* 35.6* 36.2* 32.7* 33.8* --  MCV 85.0 88.1 87.2 84.1 85.1 --  PLT 156 136* 133* 172 188 --   CBG:  Lab 05/08/12 1255 05/08/12 0354 05/08/12 05/07/12 1948 05/07/12 1612  GLUCAP 126* 106* 107* 139* 109*    Recent Results (from the past 240 hour(s))  ANAEROBIC CULTURE     Status: Normal   Collection Time   04/30/12  5:15 PM      Component Value Range Status Comment   Specimen Description ABSCESS ABDOMEN   Final    Special Requests 4 SYRINGES @ 60CC   Final    Gram Stain     Final    Value: ABUNDANT WBC PRESENT, PREDOMINANTLY PMN     NO SQUAMOUS EPITHELIAL CELLS SEEN     ABUNDANT GRAM POSITIVE COCCI IN PAIRS     IN CLUSTERS    Culture NO ANAEROBES ISOLATED   Final    Report Status 05/05/2012 FINAL   Final   FUNGUS CULTURE W SMEAR     Status: Normal (Preliminary result)   Collection Time   04/30/12  5:15 PM      Component Value Range Status Comment   Specimen Description ABSCESS ABDOMEN   Final    Special Requests 4 SYRINGES @ 60CC   Final    Fungal Smear NO YEAST OR FUNGAL ELEMENTS SEEN   Final    Culture CANDIDA TROPICALIS   Final    Report Status PENDING   Incomplete   CULTURE, ROUTINE-ABSCESS     Status: Normal   Collection Time   04/30/12  5:15 PM      Component Value Range Status Comment   Specimen Description ABSCESS ABDOMEN   Final    Special Requests 4 SYRINGES @ 60CC   Final    Gram Stain     Final    Value: ABUNDANT WBC PRESENT, PREDOMINANTLY PMN     NO SQUAMOUS EPITHELIAL CELLS SEEN     ABUNDANT GRAM POSITIVE  COCCI IN PAIRS     IN CLUSTERS   Culture FEW YEAST CONSISTENT WITH CANDIDA SPECIES   Final    Report Status 05/04/2012 FINAL   Final     Scheduled Meds: reviewed Continuous Infusions: reviewed _________________________________________________________________  05/08/2012, 4:33 PM  LOS: 36 days    Calvert Cantor, MD [facility] Team 1 [phone]

## 2012-05-09 LAB — GLUCOSE, CAPILLARY: Glucose-Capillary: 108 mg/dL — ABNORMAL HIGH (ref 70–99)

## 2012-05-09 MED ORDER — INSULIN REGULAR HUMAN 100 UNIT/ML IJ SOLN
INTRAVENOUS | Status: AC
  Administered 2012-05-09: 18:00:00 via INTRAVENOUS
  Filled 2012-05-09: qty 2000

## 2012-05-09 NOTE — Progress Notes (Addendum)
PARENTERAL NUTRITION CONSULT NOTE - FOLLOW UP  Pharmacy Consult for TPN Indication: SBO, s/p LOA/SBR/anastamosis , s/p LOA/SBR/re-anastamosis with patch repair for leak  Allergies  Allergen Reactions  . Allopurinol     REACTION: decreased platelets  . Aspirin     REACTION: unspecified   Patient Measurements: Height: 5\' 11"  (180.3 cm) Weight: 123 lb 7.3 oz (56 kg) IBW/kg (Calculated) : 75.3   Vital Signs: Temp: 98.2 F (36.8 C) (08/18 0400) Temp src: Axillary (08/18 0400) BP: 140/71 mmHg (08/18 0400) Pulse Rate: 68  (08/18 0400) Intake/Output from previous day: 08/17 0701 - 08/18 0700 In: 1835 [I.V.:360; IV Piggyback:150; TPN:1320] Out: 3955 [Emesis/NG output:300; Drains:55]  Labs:  [facility] 05/08/12 0830 05/07/12 0930 05/07/12 0645  WBC 13.9* 15.5* 13.9*  HGB 11.5* 11.8* 11.7*  HCT 34.6* 35.6* 36.2*  PLT 156 136* 133*  APTT -- -- --  INR -- -- --    Basename 05/07/12 0930 05/06/12 0809 05/06/12 0450  NA 128* 124* --  K 3.9 3.2* --  CL 91* 86* --  CO2 22 20 --  GLUCOSE 115* 116* --  BUN 51* 78* --  CREATININE 4.25* 5.69* --  CALCIUM 9.5 10.1 --  MG -- -- 1.6  PHOS -- 3.6 3.6  ALBUMIN -- 1.7* --   Estimated Creatinine Clearance: 12.2 ml/min (by C-G formula based on Cr of 5.63).   Basename 05/09/12 0343 05/08/12 2333 05/08/12 2044  GLUCAP 118* 96 124*   Insulin Requirements in the past 12 hours:  2 units SSI + 20 units insulin R in TNA.   Nutritional Goals:  1700-1950 kCal, 75-85 grams of protein per day  Current Nutrition:  Clinimix 5/15 at 70 ml/hr with 20% lipids at 10 ml/hr MWF provides an average of 1399 kcal and 84g protein per day  Admit:  Admitted 7/13 with abd pain, weight loss, SBO. S/p LOA, SBR 724 and repeat LOA, SBR with anasomotic leak repair 8/1 , thrombectomy with AVG revision 7/25 and repeat thrombectomy 8/6.  CT on 8/13 reveals enlarging abscess and enteric fistula.  GI: Has been tolerating TPN thus far with some mild hyponatremia. NGT  output 260cc/24h.  Still with some wound drainage output. Fluid removed from RLQ drain (85ml).  Endo:  CBGs 96-124 (goal < 150).  On moderate SSI q4 + 20 units insulin R in TPN. He also continues on Solu-Cortef-25mg  bid.  Lytes:  No new labs today - some mild hyponatremia yesterday still.  Goals for lytes with ileus are K~4, Mg~2, and Phos~3.5.  Renal is supplementing electrolytes for him.  Renal:  ESRD w/HD typically T/TH/Sat - currently receiving HD d/t fluid overload with ongoing TPN.  HD is sporadic based on volume issues - received HD on Saturday and will get again on Monday. On Nulecit, Aranesp-Sat  Pulm: Extubated on 8/1; 100% on RA  Cards: BP elevated (157/79) -- however becomes hypotensive during HD sessions and unable to tolerate UF. HR wnl-tachy (70-117). C/o CP on 8/14 unrelieved with NTG x3 doses, tropnin 1.76 -- thought to be related to demand ischemia. No further CP this AM.  On scheduled lopressor and prn hydralazine.  Hepatobil: LFTs, trig and chol WNL.  Alkphos and tbil are elevated. Albumin is low. Prealbumin is at goal.  Repeat labs on Monday for f/u.  Neuro: GCS 15,  Pain 0-3/10. On fent patch, prn pain meds.  ID: Fluconazole + Zosyn for peritonitis/abd abscess -- ID following. Afeb, WBC now trending downward.   Abd abscess culture: yeast. Drain  repositioned and removed 200 cc of brown fluid from abscess on 8/14 -- not sent for culturing. Best Practices:  Mouthcare, PPI-IV  Plan:  1. Continue Clinimix 5/15 at 70 ml/hr, which meets protein goal but is just below kcal. 2.  Continue insulin at 20 units/bag in TPN and monitor closely 3.  Provide available trace elements, MVI and lipids @10cc /hr on MWF only d/t national backorder. 4. Will f/u TNA labs  Nadara Mustard, PharmD., MS Clinical Pharmacist Pager:  [phone]  Thank you for allowing pharmacy to be part of this patients care team. 05/09/2012 7:35 AM

## 2012-05-09 NOTE — Progress Notes (Signed)
Subjective: Interval History: No new complaints. No bowel activity. 3600 off with HD yest, 55 from drains. , 300 from NG  Objective: Vital signs in last 24 hours: Temp:  [96.5 F (35.8 C)-98.9 F (37.2 C)] 97.8 F (36.6 C) (08/18 0739) Pulse Rate:  [68-132] 75  (08/18 0739) Resp:  [11-20] 16  (08/18 0739) BP: (91-157)/(44-84) 157/79 mmHg (08/18 0739) SpO2:  [97 %-100 %] 100 % (08/18 0739) Weight:  [54 kg (119 lb 0.8 oz)-56 kg (123 lb 7.3 oz)] 56 kg (123 lb 7.3 oz) (08/18 0600) Weight change: -4.1 kg (-9 lb 0.6 oz)  Intake/Output from previous day: 08/17 0701 - 08/18 0700 In: 1925 [I.V.:380; IV Piggyback:150; TPN:1390] Out: 3955 [Emesis/NG output:300; Drains:55] Intake/Output this shift:    General appearance: cachectic, slowed mentation and cooperative and good conversation Resp: good air movement, no sig rales or wheezing Cardio: S1, S2 normal and systolic murmur: holosystolic 2/6, blowing at apex GI: few bs, wound clean, sutures is base, scaphoid, collection bag with greenish stool over midabdominal  Extremities: AVG L groin B&T, 2+ unilateral L leg edema  Lab Results:  Basename 05/08/12 0830 05/07/12 0930  WBC 13.9* 15.5*  HGB 11.5* 11.8*  HCT 34.6* 35.6*  PLT 156 136*   BMET:   Basename 05/08/12 0830 05/07/12 0930  NA 126* 128*  K 3.3* 3.9  CL 88* 91*  CO2 20 22  GLUCOSE 89 115*  BUN 77* 51*  CREATININE 5.63* 4.25*  CALCIUM 9.8 9.5   No results found for this basename: PTH:2 in the last 72 hours Iron Studies: No results found for this basename: IRON,TIBC,TRANSFERRIN,FERRITIN in the last 72 hours  Studies/Results: No results found.  I have reviewed the patient's current medications.  Assessment/Plan: 1. ESRD for HD TTS schedule, BP ^.  Next HD Tues 2. Access- L thigh AVG, s/p declot. L leg swelling, new > shuntogram Mon 3. HTN - BP high side, on IV metoprolol qid 4. Nutrition TNA 5. Anemia EPO, fe ok 6. S/P SBO with adhesions, s/p resx and dehisc post  with now abscess w drain and draining abd wound with collecting bag in place, on AB, bowel rest and wound care. 7. Gout 8. HPTH 9. Severe malnutrition and debill  P HD, TNA, AB, wound care   LOS: 37 days   [name] D 05/09/2012,9:26 AM

## 2012-05-09 NOTE — Progress Notes (Signed)
12 Days Post-Op  Subjective: No new complaints  Objective: Vital signs in last 24 hours: Temp:  [96.5 F (35.8 C)-98.9 F (37.2 C)] 97.8 F (36.6 C) (08/18 0739) Pulse Rate:  [68-132] 75  (08/18 0739) Resp:  [11-20] 16  (08/18 0739) BP: (91-157)/(44-84) 157/79 mmHg (08/18 0739) SpO2:  [97 %-100 %] 100 % (08/18 0739) Weight:  [54 kg (119 lb 0.8 oz)-56 kg (123 lb 7.3 oz)] 56 kg (123 lb 7.3 oz) (08/18 0600) Last BM Date:  (unable to verbalize last bm)  Intake/Output from previous day: 08/17 0701 - 08/18 0700 In: 1925 [I.V.:380; IV Piggyback:150; TPN:1390] Out: 3955 [Emesis/NG output:300; Drains:55] Intake/Output this shift:    General appearance: alert and cooperative Resp: clear to auscultation bilaterally Cardio: S1, S2 normal GI: Eakins pouch with enteric output, drain output bilious, soft, NT  Lab Results:   Basename 05/08/12 0830 05/07/12 0930  WBC 13.9* 15.5*  HGB 11.5* 11.8*  HCT 34.6* 35.6*  PLT 156 136*   BMET  Basename 05/08/12 0830 05/07/12 0930  NA 126* 128*  K 3.3* 3.9  CL 88* 91*  CO2 20 22  GLUCOSE 89 115*  BUN 77* 51*  CREATININE 5.63* 4.25*  CALCIUM 9.8 9.5   PT/INR No results found for this basename: LABPROT:2,INR:2 in the last 72 hours ABG No results found for this basename: PHART:2,PCO2:2,PO2:2,HCO3:2 in the last 72 hours  Studies/Results: No results found.  Anti-infectives: Anti-infectives     Start     Dose/Rate Route Frequency Ordered Stop   05/08/12 1200   fluconazole (DIFLUCAN) IVPB 400 mg        400 mg 200 mL/hr over 60 Minutes Intravenous Every T-Th-Sa (Hemodialysis) 05/07/12 1752     04/30/12 1400   vancomycin (VANCOCIN) 750 mg in sodium chloride 0.9 % 150 mL IVPB  Status:  Discontinued        750 mg 150 mL/hr over 60 Minutes Intravenous  Once 04/30/12 1212 04/30/12 1507   04/29/12 2330   vancomycin (VANCOCIN) 1,500 mg in sodium chloride 0.9 % 500 mL IVPB        1,500 mg 250 mL/hr over 120 Minutes Intravenous  Once  04/29/12 2259 04/30/12 0246   04/27/12 0000   ceFAZolin (ANCEF) IVPB 1 g/50 mL premix  Status:  Discontinued     Comments: Send with pt to OR      1 g 100 mL/hr over 30 Minutes Intravenous On call 04/26/12 0927 04/26/12 0928   04/24/12 1000   micafungin (MYCAMINE) 100 mg in sodium chloride 0.9 % 100 mL IVPB  Status:  Discontinued        100 mg 100 mL/hr over 1 Hours Intravenous Daily 04/24/12 0750 05/07/12 1713   04/23/12 1600   fluconazole (DIFLUCAN) IVPB 400 mg        400 mg 200 mL/hr over 60 Minutes Intravenous  Once 04/23/12 1428 04/23/12 1800   04/22/12 2200   piperacillin-tazobactam (ZOSYN) IVPB 3.375 g  Status:  Discontinued        3.375 g 100 mL/hr over 30 Minutes Intravenous 3 times per day 04/22/12 2120 04/22/12 2137   04/22/12 2200   piperacillin-tazobactam (ZOSYN) IVPB 2.25 g        2.25 g 100 mL/hr over 30 Minutes Intravenous 3 times per day 04/22/12 2140     04/22/12 2157   gentamicin (GARAMYCIN) injection  Status:  Discontinued          As needed 04/22/12 2158 04/22/12 2335   04/22/12 2156  clindamycin (CLEOCIN) injection  Status:  Discontinued          As needed 04/22/12 2157 04/22/12 2335   04/22/12 2145   piperacillin-tazobactam (ZOSYN) IVPB 3.375 g        3.375 g 12.5 mL/hr over 240 Minutes Intravenous  Once 04/22/12 2141 04/23/12 0145   04/22/12 2130   gentamicin (GARAMYCIN) 240 mg, clindamycin (CLEOCIN) 900 mg in sodium chloride irrigation 0.9 % 1,000 mL irrigation  Status:  Discontinued         Irrigation  Once 04/22/12 2119 04/23/12 0020   04/14/12 0830   ertapenem (INVANZ) 1 g in sodium chloride 0.9 % 50 mL IVPB        1 g 100 mL/hr over 30 Minutes Intravenous To Surgery 04/14/12 0816 04/14/12 0834   04/13/12 1359   ertapenem (INVANZ) 1 g in sodium chloride 0.9 % 50 mL IVPB  Status:  Discontinued        1 g 100 mL/hr over 30 Minutes Intravenous 60 min pre-op 04/13/12 1359 04/14/12 0816          Assessment/Plan: s/p Procedure(s)  (LRB): THROMBECTOMY ARTERIOVENOUS GORE-TEX GRAFT (Left) REVISION OF ARTERIOVENOUS GORETEX GRAFT (Left) S/P SBR with EC fistula - continue Eakins pouch and IR drain, abdominal exam stable and WBC trending down ID - Zosyn FEN - TNA ESRD - per renal  LOS: 37 days    [name] E 05/09/2012

## 2012-05-10 ENCOUNTER — Inpatient Hospital Stay (HOSPITAL_COMMUNITY): Payer: Medicare Other

## 2012-05-10 DIAGNOSIS — T8130XA Disruption of wound, unspecified, initial encounter: Secondary | ICD-10-CM | POA: Diagnosis not present

## 2012-05-10 LAB — GLUCOSE, CAPILLARY
Glucose-Capillary: 107 mg/dL — ABNORMAL HIGH (ref 70–99)
Glucose-Capillary: 115 mg/dL — ABNORMAL HIGH (ref 70–99)
Glucose-Capillary: 140 mg/dL — ABNORMAL HIGH (ref 70–99)
Glucose-Capillary: 145 mg/dL — ABNORMAL HIGH (ref 70–99)
Glucose-Capillary: 154 mg/dL — ABNORMAL HIGH (ref 70–99)

## 2012-05-10 LAB — DIFFERENTIAL
Basophils Absolute: 0 10*3/uL (ref 0.0–0.1)
Lymphocytes Relative: 6 % — ABNORMAL LOW (ref 12–46)
Monocytes Absolute: 1 10*3/uL (ref 0.1–1.0)
Neutro Abs: 19 10*3/uL — ABNORMAL HIGH (ref 1.7–7.7)

## 2012-05-10 LAB — CBC
MCH: 27.8 pg (ref 26.0–34.0)
Platelets: 126 10*3/uL — ABNORMAL LOW (ref 150–400)
RBC: 3.95 MIL/uL — ABNORMAL LOW (ref 4.22–5.81)
WBC: 21.6 10*3/uL — ABNORMAL HIGH (ref 4.0–10.5)

## 2012-05-10 LAB — PREALBUMIN: Prealbumin: 26.6 mg/dL (ref 17.0–34.0)

## 2012-05-10 LAB — TRIGLYCERIDES: Triglycerides: 89 mg/dL (ref <150)

## 2012-05-10 MED ORDER — ZINC TRACE METAL 1 MG/ML IV SOLN
INTRAVENOUS | Status: AC
  Administered 2012-05-10: 18:00:00 via INTRAVENOUS
  Filled 2012-05-10: qty 2000

## 2012-05-10 MED ORDER — HYDROMORPHONE HCL PF 1 MG/ML IJ SOLN
1.0000 mg | Freq: Once | INTRAMUSCULAR | Status: AC
  Administered 2012-05-10: 1 mg via INTRAVENOUS
  Filled 2012-05-10: qty 1

## 2012-05-10 MED ORDER — IOHEXOL 300 MG/ML  SOLN
100.0000 mL | Freq: Once | INTRAMUSCULAR | Status: AC | PRN
  Administered 2012-05-10: 32 mL via INTRAVENOUS

## 2012-05-10 MED ORDER — FAT EMULSION 20 % IV EMUL
250.0000 mL | INTRAVENOUS | Status: AC
  Administered 2012-05-10: 250 mL via INTRAVENOUS
  Filled 2012-05-10: qty 250

## 2012-05-10 MED ORDER — MORPHINE SULFATE 2 MG/ML IJ SOLN
INTRAMUSCULAR | Status: AC
  Administered 2012-05-10: 2 mg via INTRAVENOUS
  Filled 2012-05-10: qty 1

## 2012-05-10 MED ORDER — HEPARIN SODIUM (PORCINE) 1000 UNIT/ML DIALYSIS
40.0000 [IU]/kg | Freq: Once | INTRAMUSCULAR | Status: AC
  Administered 2012-05-10: 2200 [IU] via INTRAVENOUS_CENTRAL

## 2012-05-10 NOTE — Progress Notes (Signed)
PARENTERAL NUTRITION/ABX CONSULT NOTE - FOLLOW UP  Pharmacy Consult for TPN/Zosyn/Diflucan Indication: SBO, s/p LOA/SBR/anastamosis , s/p LOA/SBR/re-anastamosis with patch repair for leak  Allergies  Allergen Reactions  . Allopurinol     REACTION: decreased platelets  . Aspirin     REACTION: unspecified   Patient Measurements: Height: 5\' 11"  (180.3 cm) Weight: 119 lb 0.8 oz (54 kg) IBW/kg (Calculated) : 75.3   Vital Signs: Temp: 97.7 F (36.5 C) (08/19 0723) Temp src: Oral (08/19 0723) BP: 143/81 mmHg (08/19 0843) Pulse Rate: 94  (08/19 0843) Intake/Output from previous day: 08/18 0701 - 08/19 0700 In: 2250 [I.V.:460; IV Piggyback:100; TPN:1680] Out: 455 [Emesis/NG output:300; Drains:155]  Labs:  [facility] 05/10/12 0514 05/08/12 0830 05/07/12 0930  WBC 21.6* 13.9* 15.5*  HGB 11.0* 11.5* 11.8*  HCT 33.4* 34.6* 35.6*  PLT 126* 156 136*  APTT -- -- --  INR -- -- --    Basename 05/07/12 0930 05/06/12 0809 05/06/12 0450  NA 128* 124* --  K 3.9 3.2* --  CL 91* 86* --  CO2 22 20 --  GLUCOSE 115* 116* --  BUN 51* 78* --  CREATININE 4.25* 5.69* --  CALCIUM 9.5 10.1 --  MG -- -- 1.6  PHOS -- 3.6 3.6  ALBUMIN -- 1.7* --   Estimated Creatinine Clearance: 11.7 ml/min (by C-G formula based on Cr of 5.63).   Basename 05/10/12 0453 05/10/12 0010 05/09/12 2048  GLUCAP 107* 140* 160*   Insulin Requirements in the past 12 hours:  9 units SSI + 20 units insulin R in TNA.   Nutritional Goals:  1700-1950 kCal, 75-85 grams of protein per day  Current Nutrition:  Clinimix 5/15 at 70 ml/hr with 20% lipids at 10 ml/hr MWF provides an average of 1399 kcal and 84g protein per day  Admit:  Admitted 7/13 with abd pain, weight loss, SBO. S/p LOA, SBR 724 and repeat LOA, SBR with anasomotic leak repair 8/1 , thrombectomy with AVG revision 7/25 and repeat thrombectomy 8/6.  CT on 8/13 reveals enlarging abscess and enteric fistula.  GI: Has been tolerating TPN thus far with some mild  hyponatremia. NGT output 300cc/24h.  Still with some wound drainage output. Fluid removed from RLQ drain (85ml).  Endo:  CBGs 108-160 (goal < 150).  On moderate SSI q4 + 20 units insulin R in TPN. He also continues on Solu-Cortef-25mg  bid.  Lytes:  No new labs today - some mild hyponatremia with previous labsl.  Goals for lytes with ileus are K~4, Mg~2, and Phos~3.5.  Renal is supplementing electrolytes for him.  Renal:  ESRD w/HD typically T/TH/Sat - currently receiving HD d/t fluid overload with ongoing TPN.  HD is sporadic based on volume issues - received HD on Saturday and will get again on Monday. On Nulecit, Aranesp-Sat  Pulm: Extubated on 8/1; 100% on RA  Cards: BP elevated (157/79) -- however becomes hypotensive during HD sessions and unable to tolerate UF. HR wnl-tachy (70-117). C/o CP on 8/14 unrelieved with NTG x3 doses, tropnin 1.76 -- thought to be related to demand ischemia. No further CP this AM.  On scheduled lopressor and prn hydralazine.  Hepatobil: LFTs, trig and chol WNL.  Alkphos and tbil are elevated. Albumin is low. Prealbumin is at goal. Chol and TG trending up Neuro: GCS 15,  Pain 0-3/10. On fent patch, prn pain meds.  ID: Fluconazole + Zosyn for peritonitis/abd abscess -- ID following. Afeb, WBC inc today.   Abd abscess culture: candida tropicalis, last note says  sens to azoles but not showing in cx reportt. Drain repositioned and removed 200 cc of brown fluid from abscess on 8/14 -- not sent for culturing. Will need to give additional diflucan if hd today. Perhaps may need to switch back to micafungin. Zosyn 8.1>> Diflucan 8.16>> Best Practices:  Mouthcare, PPI-IV  Plan:  1. Continue Clinimix 5/15 at 70 ml/hr, which meets protein goal but is just below kcal. 2.  Continue insulin at 20 units/bag in TPN and monitor closely 3.  Provide available trace elements, MVI and lipids @10cc /hr on MWF only d/t national backorder. 4. Will f/u TNA labs  [place],  PharmD, [place] Pager [phone] 05/10/2012 9:07 AM

## 2012-05-10 NOTE — Consult Note (Signed)
Wound care follow-up:  Eakin pouch intact over abd full thickness wound since Friday.  100cc dark red drainage in pouch with some old clotted blood.  Incision with retention sutures and yellow wound bed.  Aquacel applied to upper wound and covered by Eakin seal, and Eakin pouch applied to remaining portion of abd wound.  Pin appears to be tender when areas are touched.     Cammie Mcgee, RN, MSN, [employer]  [phone]

## 2012-05-10 NOTE — Progress Notes (Signed)
  VASCULAR AND VEIN SURGERY PROGRESS NOTE  POST-OP HEMODIALYSIS ACCESS  Date of Surgery: 04/15/12 Thrombectomy/revision venous end left femoral AVGG Surgeon: Surgeon(s): Chuck Hint, MD 13 Days Post-Op  04/27/12 REVISION OF ARTERIOVENOUS GORETEX GRAFT - arterial end of left thigh AVGG  HPI: [name] is a 51 y.o. male who has had revision of left thigh graft  Hemodialysis access. He is to have shuntogram today. We were asked by Dr. Darrick Penna to assess graft for possible narrowing. Pt states sometimes the graft has high flows and at other times it is normal. The patient denies symptoms of numbness, tingling, weakness; denies pain in the operative limb.   Significant Diagnostic Studies: CBC Lab Results  Component Value Date   WBC 21.6* 05/10/2012   HGB 11.0* 05/10/2012   HCT 33.4* 05/10/2012   MCV 84.6 05/10/2012   PLT 126* 05/10/2012    BMET    Component Value Date/Time   NA 126* 05/08/2012 0830   K 3.3* 05/08/2012 0830   CL 88* 05/08/2012 0830   CO2 20 05/08/2012 0830   GLUCOSE 89 05/08/2012 0830   BUN 77* 05/08/2012 0830   CREATININE 5.63* 05/08/2012 0830   CALCIUM 9.8 05/08/2012 0830   GFRNONAA 10* 05/08/2012 0830   GFRAA 12* 05/08/2012 0830    COAG Lab Results  Component Value Date   INR 1.15 04/30/2012   No results found for this basename: PTT    Vital Signs  BP Readings from Last 3 Encounters:  05/10/12 137/69  05/10/12 137/69  05/10/12 137/69   Temp Readings from Last 3 Encounters:  05/10/12 97.2 F (36.2 C) Oral  05/10/12 97.2 F (36.2 C) Oral  05/10/12 97.2 F (36.2 C) Oral   SpO2 Readings from Last 3 Encounters:  05/10/12 100%  05/10/12 100%  05/10/12 100%   Pulse Readings from Last 3 Encounters:  05/10/12 95  05/10/12 95  05/10/12 95     Physical Examination  LLE skin color is normal, sensation in foot is intact;  There is a  good bruit in the left femoral AVGG.  Wounds well healed  Assessment/Plan [name] is a 45 y.o. year  old male who presents s/p revision both arterial and venous ends (during this admission) of left lower extremity femoral Hemodialysis access. Awaiting results of shuntogram Good thrill in AVGG  [name] 05/10/2012 2:39 PM

## 2012-05-10 NOTE — Progress Notes (Signed)
Subjective: Interval History: has complaints pain, weak, tired.  Objective: Vital signs in last 24 hours: Temp:  [97.6 F (36.4 C)-98.7 F (37.1 C)] 97.7 F (36.5 C) (08/19 0723) Pulse Rate:  [66-87] 84  (08/19 0730) Resp:  [12-20] 15  (08/19 0730) BP: (119-163)/(57-77) 149/63 mmHg (08/19 0723) SpO2:  [100 %] 100 % (08/19 0730) Weight:  [54 kg (119 lb 0.8 oz)] 54 kg (119 lb 0.8 oz) (08/19 0533) Weight change: 0 kg (0 lb)  Intake/Output from previous day: 08/18 0701 - 08/19 0700 In: 2180 [I.V.:460; IV Piggyback:100; TPN:1610] Out: 455 [Emesis/NG output:300; Drains:155] Intake/Output this shift:    General appearance: cooperative, cachectic and moderate distress Resp: diminished breath sounds bilaterally and rales bibasilar Cardio: S1, S2 normal and systolic murmur: systolic ejection 2/6, decrescendo at 2nd left intercostal space GI: no bs, ostomy upper abdm, incision lower abdm, no bs mild distension Extremities: avg L groin Incision/Wound:  Lab Results:  Basename 05/10/12 0514 05/08/12 0830  WBC 21.6* 13.9*  HGB 11.0* 11.5*  HCT 33.4* 34.6*  PLT 126* 156   BMET:  Basename 05/08/12 0830 05/07/12 0930  NA 126* 128*  K 3.3* 3.9  CL 88* 91*  CO2 20 22  GLUCOSE 89 115*  BUN 77* 51*  CREATININE 5.63* 4.25*  CALCIUM 9.8 9.5   No results found for this basename: PTH:2 in the last 72 hours Iron Studies: No results found for this basename: IRON,TIBC,TRANSFERRIN,FERRITIN in the last 72 hours  Studies/Results: No results found.  I have reviewed the patient's current medications.  Assessment/Plan: 1 ESRD vol xs, extra HD 2 Nutrition very poor. Vol an issue 3 S/p SBO and reop for dehisc  ? Fistula, slow course. Has abscess on AB 4 Gout inactive 5 Anemia epo  P HD, TNA, follow lytes, AB wound care    LOS: 38 days   Breeana Sawtelle L 05/10/2012,7:59 AM

## 2012-05-10 NOTE — Progress Notes (Signed)
Patient ID: [name], male   DOB: Apr 02, 1961, 51 y.o.   MRN: [id number] Patient currently on hemodialysis via left femoral loop graft. Tomogram from today reviewed with some narrowing at the venous anastomosis. Dr. Adele Dan notes reviewed and filled there is no option for surgical revision. Would continue with dialysis and consider angioplasty of the anastomosis by interventional radiology in the future should he develop high outflow

## 2012-05-10 NOTE — Progress Notes (Signed)
13 Days Post-Op  Subjective: Some RLQ pain overnight  Objective: Vital signs in last 24 hours: Temp:  [97.6 F (36.4 C)-98.7 F (37.1 C)] 97.7 F (36.5 C) (08/19 0723) Pulse Rate:  [66-87] 86  (08/19 0800) Resp:  [12-20] 18  (08/19 0800) BP: (119-163)/(57-77) 147/76 mmHg (08/19 0800) SpO2:  [99 %-100 %] 99 % (08/19 0800) Weight:  [54 kg (119 lb 0.8 oz)] 54 kg (119 lb 0.8 oz) (08/19 0533) Last BM Date: 04/30/12 (last BM documented)  Intake/Output from previous day: 08/18 0701 - 08/19 0700 In: 2180 [I.V.:460; IV Piggyback:100; TPN:1610] Out: 455 [Emesis/NG output:300; Drains:155] Intake/Output this shift:    General appearance: cooperative Resp: clear to auscultation bilaterally Cardio: regular rate and rhythm GI: soft, ND, tender RLQ without guarding, a few BS, Eakins output blood tinged bile Ext: no edema  Lab Results:   Basename 05/10/12 0514 05/08/12 0830  WBC 21.6* 13.9*  HGB 11.0* 11.5*  HCT 33.4* 34.6*  PLT 126* 156   BMET  Basename 05/08/12 0830 05/07/12 0930  NA 126* 128*  K 3.3* 3.9  CL 88* 91*  CO2 20 22  GLUCOSE 89 115*  BUN 77* 51*  CREATININE 5.63* 4.25*  CALCIUM 9.8 9.5   PT/INR No results found for this basename: LABPROT:2,INR:2 in the last 72 hours ABG No results found for this basename: PHART:2,PCO2:2,PO2:2,HCO3:2 in the last 72 hours  Studies/Results: No results found.  Anti-infectives: Anti-infectives     Start     Dose/Rate Route Frequency Ordered Stop   05/08/12 1200   fluconazole (DIFLUCAN) IVPB 400 mg        400 mg 200 mL/hr over 60 Minutes Intravenous Every T-Th-Sa (Hemodialysis) 05/07/12 1752     04/30/12 1400   vancomycin (VANCOCIN) 750 mg in sodium chloride 0.9 % 150 mL IVPB  Status:  Discontinued        750 mg 150 mL/hr over 60 Minutes Intravenous  Once 04/30/12 1212 04/30/12 1507   04/29/12 2330   vancomycin (VANCOCIN) 1,500 mg in sodium chloride 0.9 % 500 mL IVPB        1,500 mg 250 mL/hr over 120 Minutes Intravenous   Once 04/29/12 2259 04/30/12 0246   04/27/12 0000   ceFAZolin (ANCEF) IVPB 1 g/50 mL premix  Status:  Discontinued     Comments: Send with pt to OR      1 g 100 mL/hr over 30 Minutes Intravenous On call 04/26/12 0927 04/26/12 0928   04/24/12 1000   micafungin (MYCAMINE) 100 mg in sodium chloride 0.9 % 100 mL IVPB  Status:  Discontinued        100 mg 100 mL/hr over 1 Hours Intravenous Daily 04/24/12 0750 05/07/12 1713   04/23/12 1600   fluconazole (DIFLUCAN) IVPB 400 mg        400 mg 200 mL/hr over 60 Minutes Intravenous  Once 04/23/12 1428 04/23/12 1800   04/22/12 2200   piperacillin-tazobactam (ZOSYN) IVPB 3.375 g  Status:  Discontinued        3.375 g 100 mL/hr over 30 Minutes Intravenous 3 times per day 04/22/12 2120 04/22/12 2137   04/22/12 2200   piperacillin-tazobactam (ZOSYN) IVPB 2.25 g        2.25 g 100 mL/hr over 30 Minutes Intravenous 3 times per day 04/22/12 2140     04/22/12 2157   gentamicin (GARAMYCIN) injection  Status:  Discontinued          As needed 04/22/12 2158 04/22/12 2335   04/22/12 2156  clindamycin (CLEOCIN) injection  Status:  Discontinued          As needed 04/22/12 2157 04/22/12 2335   04/22/12 2145   piperacillin-tazobactam (ZOSYN) IVPB 3.375 g        3.375 g 12.5 mL/hr over 240 Minutes Intravenous  Once 04/22/12 2141 04/23/12 0145   04/22/12 2130   gentamicin (GARAMYCIN) 240 mg, clindamycin (CLEOCIN) 900 mg in sodium chloride irrigation 0.9 % 1,000 mL irrigation  Status:  Discontinued         Irrigation  Once 04/22/12 2119 04/23/12 0020   04/14/12 0830   ertapenem (INVANZ) 1 g in sodium chloride 0.9 % 50 mL IVPB        1 g 100 mL/hr over 30 Minutes Intravenous To Surgery 04/14/12 0816 04/14/12 0834   04/13/12 1359   ertapenem (INVANZ) 1 g in sodium chloride 0.9 % 50 mL IVPB  Status:  Discontinued        1 g 100 mL/hr over 30 Minutes Intravenous 60 min pre-op 04/13/12 1359 04/14/12 0816          Assessment/Plan: s/p Procedure(s)  (LRB): THROMBECTOMY ARTERIOVENOUS GORE-TEX GRAFT (Left) REVISION OF ARTERIOVENOUS GORETEX GRAFT (Left) S/P SBR with EC fistula - continue Eakins pouch and IR drain, some increase in pain and WBC elevated, may need F/U CT this week to ensure adequate drainage. Further surgery not an option at this time according to my partners that did his last surgery.  Hopefully with adequate drainage this will close. ID - Zosyn FEN - TNA ESRD - per renal  LOS: 38 days    [name] E 05/10/2012

## 2012-05-10 NOTE — Procedures (Signed)
I was present at this session.  I have reviewed the session itself and made appropriate changes.  L thigh AVG. shuntogram reviewed.  [name] L 8/19/20133:18 PM

## 2012-05-10 NOTE — Progress Notes (Signed)
[facility] PROGRESS NOTE  [name] JXB:[id number] DOB: Jan 06, 1961 DOA: 04/02/2012 PCP: Trevor Iha, MD  Assessment/Plan:  SBO (small bowel obstruction) s/p lysis of adhesions and partial small bowel resection 7/24 *Subsequent anastomotic leak- omental patch of anastomosis 8/2  *CT abdomen and pelvis 04/29/2012 demonstrated several fluid collections consistent with intra-abdominal abscess with the largest measuring 16.5 x 13.7 x 6.4 cm.  *Patient underwent percutaneous drainage of largest fluid collection 04/30/2012 *Repeat CT done for increasing WBC count on 8/13 reveals enlarging abscess and enteric fistula and obstructed drainage tube. He underwent drain reposition on 8/14 *Has open abdom wound which is connecting with abscess cavity-  Drainage is extensive and now has an Eakins pouch-today this wound is draining melanotic colored drainage which was confirmed as being heme-positive. More concerning is his marked increase in abdominal pain which is a new problem for him. It is noted that even with prior development of abscess fluid collections the patient was not having significant abdominal pain and only briefly had back pain. 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 *An attempt was made on 04/21/2012 to place a central line in interventional radiology under fluoroscopy but unfortunately central venous access could not be placed due to upper extremity and internal jugular vein access being chronically occluded bilaterally therefore rationale for why  we have to continue to use this patient's right femoral Vas-Cath port for parenteral nutrition  Chronic nonocclusive left lower extremity DVT Diagnosed via Doppler 04/30/2012  Ileus following gastrointestinal surgery *Cont TNA via right femoral Vas-Cath port per surgery  Atypical chest pain/abnormal cardiac enzyme *Having episodes of severe chest pain during this hospitalization. CT of the chest revealed no evidence of pulmonary embolism or aortic dissection *Suspect mildly elevated cardiac isoenzymes related to demand ischemia versus recent stressors of recurrent surgical procedures  CKD (chronic kidney disease) stage V requiring chronic dialysis *Dialysis per nephrology  Hypokalemia/hypophosphatemia/hyponatremia *Management per nephrology service/TNA  Thrombocytopenia *See above  Normocytic Anemia  *Stable. Due to acute blood loss and due to chronic disease *Has received a total of 4 units of packed red blood cells this admission *See above regarding hemoglobin into bleeding from open abdominal wound *Cont aranesp and ferric gluconate  HTN (hypertension) Fair control- IV Metoprolol  Chronic use of steroids Steroid being slowly tapered down  Malnutrition, calorie *Patient cachectic at presentation  *Parenteral nutrition managed by surgery and pharmacy  Leg dialysis graft occlusion *s/p thrombectomy 7/25 - unfortunately re clotted-has undergone an additional thrombectomy procedure with revision of arterial and graft on 04/27/2012 *Was being dialyzed via HD cath in right groin but now graft is being used again *See discussion per vascular surgery in their note today concerning narrowing at the venous anastomosis  DVT prophylaxis: SCDs Code Status: full  Disposition Plan: Remain in step down  Consultants: General Surgery [facility] Infectious disease  Events since admission: 7/13 CT Abdomen with SBO  7/24 Ex-lap, lysis of adhesions, partial small bowel  resection x2  7/25 Thrombectomy L femoral AV graft  7/26 Extubated, CVL is out  7/27 Transferred to telemetry under [facility]  8/2 Acute abdomen, dehiscence of anastomosis, SBO, back from OR intubated in  ICU on PCCM service. Found to have moderate peritonitis infraumbilically 8/2 Extubated  Antibiotics: Zosyn 8/1 >>> Micafungin 8/3 >> Invanz 7/24- periop Fluconazole- 8/2- one dose Vancomycin 8/8 >>> 05/01/2012  HPI/Subjective: No complaints- flat affect.   Objective: Filed Vitals:   05/10/12 0723 05/10/12 0730 05/10/12 0800 05/10/12 0843  BP: 149/63  147/76 143/81  Pulse: 87 84 86 94  Temp: 97.7 F (36.5 C)     TempSrc: Oral     Resp: 20 15 18 19   Height:      Weight:      SpO2: 100% 100% 99% 98%    Intake/Output Summary (Last 24 hours) at 05/10/12 1037 Last data filed at 05/10/12 0900  Gross per 24 hour  Intake   2120 ml  Output    475 ml  Net   1645 ml    Exam:   General:  Alert, appears calm, in no acute distress  Cardiovascular: RRR, no m/r/g. 2+ LLE edeam  Respiratory: CTA bilaterally, no w/r/r. Normal respiratory effort.  Data Reviewed: Basic Metabolic Panel:  Lab 05/08/12 1610 05/07/12 0930 05/06/12 0809 05/06/12 0450 05/05/12 0635 05/04/12 0345  NA 126* 128* 124* -- 132* 134*  K 3.3* 3.9 3.2* -- 3.4* 4.0  CL 88* 91* 86* -- 92* 97  CO2 20 22 20  -- 23 25  GLUCOSE 89 115* 116* -- 120* 141*  BUN 77* 51* 78* -- 45* 45*  CREATININE 5.63* 4.25* 5.69* -- 4.02* 4.02*  CALCIUM 9.8 9.5 10.1 -- 9.8 9.9  MG -- -- -- 1.6 -- 1.7  PHOS 3.1 -- 3.6 3.6 3.0 2.0*   Liver Function Tests:  Lab 05/08/12 0830 05/06/12 0809  AST 24 --  ALT 23 --  ALKPHOS 118* --  BILITOT 1.7* --  PROT 5.5* --  ALBUMIN 1.6* 1.7*   CBC:  Lab 05/10/12 0530 05/10/12 0514 05/08/12 0830 05/07/12 0930 05/07/12 0645 05/06/12 0809  WBC -- 21.6* 13.9* 15.5* 13.9* 23.4*  NEUTROABS 19.0* -- -- -- -- --  HGB -- 11.0* 11.5* 11.8* 11.7* 11.1*  HCT -- 33.4* 34.6* 35.6* 36.2* 32.7*  MCV --  84.6 85.0 88.1 87.2 84.1  PLT -- 126* 156 136* 133* 172   CBG:  Lab 05/10/12 0453 05/10/12 0010 05/09/12 2048 05/09/12 1605 05/09/12 1153  GLUCAP 107* 140* 160* 121* 108*    Recent Results (from the past 240 hour(s))  ANAEROBIC CULTURE     Status: Normal   Collection Time   04/30/12  5:15 PM      Component Value Range Status Comment   Specimen Description ABSCESS ABDOMEN   Final    Special Requests 4 SYRINGES @ 60CC   Final    Gram Stain     Final    Value: ABUNDANT WBC PRESENT, PREDOMINANTLY PMN     NO SQUAMOUS EPITHELIAL CELLS SEEN     ABUNDANT GRAM POSITIVE COCCI IN PAIRS     IN CLUSTERS   Culture NO ANAEROBES ISOLATED   Final    Report Status 05/05/2012 FINAL   Final   FUNGUS CULTURE W SMEAR     Status: Normal (Preliminary result)   Collection Time   04/30/12  5:15 PM      Component Value Range Status Comment   Specimen Description ABSCESS ABDOMEN   Final    Special Requests 4 SYRINGES @ 60CC   Final    Fungal Smear NO YEAST OR FUNGAL ELEMENTS SEEN   Final    Culture CANDIDA TROPICALIS   Final  Report Status PENDING   Incomplete   CULTURE, ROUTINE-ABSCESS     Status: Normal   Collection Time   04/30/12  5:15 PM      Component Value Range Status Comment   Specimen Description ABSCESS ABDOMEN   Final    Special Requests 4 SYRINGES @ 60CC   Final    Gram Stain     Final    Value: ABUNDANT WBC PRESENT, PREDOMINANTLY PMN     NO SQUAMOUS EPITHELIAL CELLS SEEN     ABUNDANT GRAM POSITIVE COCCI IN PAIRS     IN CLUSTERS   Culture FEW YEAST CONSISTENT WITH CANDIDA SPECIES   Final    Report Status 05/04/2012 FINAL   Final     Scheduled Meds: reviewed Continuous Infusions: reviewed _________________________________________________________________  05/10/2012, 10:37 AM  LOS: 38 days    Junious Silk, ANP [facility] Team 1 [phone]  I have personally examined this patient and reviewed the entire database. I have reviewed the above note, made any necessary editorial  changes, and agree with its content.  Lonia Blood, MD [facility]

## 2012-05-10 NOTE — Progress Notes (Signed)
Gave one time dose hydromorphone as ordered.  Patient is currently sleeping.  Continuing to monitor patient, output from eakin pouch and drain.

## 2012-05-10 NOTE — Progress Notes (Signed)
13 Days Post-Op  Subjective: Pt doing fairly well; has some intermittent RLQ discomfort  Objective: Vital signs in last 24 hours: Temp:  [97.7 F (36.5 C)-98.7 F (37.1 C)] 97.7 F (36.5 C) (08/19 0723) Pulse Rate:  [66-94] 88  (08/19 0930) Resp:  [12-20] 20  (08/19 0930) BP: (119-163)/(57-81) 143/81 mmHg (08/19 0843) SpO2:  [98 %-100 %] 98 % (08/19 0930) Weight:  [119 lb 0.8 oz (54 kg)] 119 lb 0.8 oz (54 kg) (08/19 0533) Last BM Date: 04/30/12 (last BM documented)  Intake/Output from previous day: 08/18 0701 - 08/19 0700 In: 2270 [I.V.:460; IV Piggyback:100; TPN:1680] Out: 455 [Emesis/NG output:300; Drains:155] Intake/Output this shift: Total I/O In: 320 [Other:40; TPN:280] Out: 20 [Drains:20]  Chest- diminished BS left base, right clear, heart- RRR, RLQ drain intact, output about 25 cc's old bloody fluid last 24 hrs, site mild -mod tender to palpation  Lab Results:   Basename 05/10/12 0514 05/08/12 0830  WBC 21.6* 13.9*  HGB 11.0* 11.5*  HCT 33.4* 34.6*  PLT 126* 156   BMET  Basename 05/08/12 0830  NA 126*  K 3.3*  CL 88*  CO2 20  GLUCOSE 89  BUN 77*  CREATININE 5.63*  CALCIUM 9.8   PT/INR No results found for this basename: LABPROT:2,INR:2 in the last 72 hours ABG No results found for this basename: PHART:2,PCO2:2,PO2:2,HCO3:2 in the last 72 hours Results for orders placed during the hospital encounter of 04/02/12  MRSA PCR SCREENING     Status: Normal   Collection Time   04/03/12 10:10 AM      Component Value Range Status Comment   MRSA by PCR NEGATIVE  NEGATIVE Final   CULTURE, BLOOD (ROUTINE X 2)     Status: Normal   Collection Time   04/24/12 11:35 PM      Component Value Range Status Comment   Specimen Description BLOOD LEFT ARM   Final    Special Requests BOTTLES DRAWN AEROBIC ONLY 5CC   Final    Culture  Setup Time 04/25/2012 11:02   Final    Culture NO GROWTH 5 DAYS   Final    Report Status 05/01/2012 FINAL   Final   CULTURE, BLOOD (ROUTINE X  2)     Status: Normal   Collection Time   04/24/12 11:40 PM      Component Value Range Status Comment   Specimen Description BLOOD LEFT HAND   Final    Special Requests BOTTLES DRAWN AEROBIC ONLY 5CC   Final    Culture  Setup Time 04/25/2012 11:02   Final    Culture NO GROWTH 5 DAYS   Final    Report Status 05/01/2012 FINAL   Final   CULTURE, BLOOD (ROUTINE X 2)     Status: Normal   Collection Time   04/28/12  3:38 PM      Component Value Range Status Comment   Specimen Description BLOOD RIGHT HAND   Final    Special Requests BOTTLES DRAWN AEROBIC ONLY 5CC   Final    Culture  Setup Time 04/28/2012 22:13   Final    Culture NO GROWTH 5 DAYS   Final    Report Status 05/04/2012 FINAL   Final   CULTURE, BLOOD (ROUTINE X 2)     Status: Normal   Collection Time   04/28/12  3:48 PM      Component Value Range Status Comment   Specimen Description BLOOD RIGHT ARM   Final    Special Requests BOTTLES  DRAWN AEROBIC ONLY 5CC   Final    Culture  Setup Time 04/28/2012 22:12   Final    Culture NO GROWTH 5 DAYS   Final    Report Status 05/04/2012 FINAL   Final   ANAEROBIC CULTURE     Status: Normal   Collection Time   04/30/12  5:15 PM      Component Value Range Status Comment   Specimen Description ABSCESS ABDOMEN   Final    Special Requests 4 SYRINGES @ 60CC   Final    Gram Stain     Final    Value: ABUNDANT WBC PRESENT, PREDOMINANTLY PMN     NO SQUAMOUS EPITHELIAL CELLS SEEN     ABUNDANT GRAM POSITIVE COCCI IN PAIRS     IN CLUSTERS   Culture NO ANAEROBES ISOLATED   Final    Report Status 05/05/2012 FINAL   Final   FUNGUS CULTURE W SMEAR     Status: Normal (Preliminary result)   Collection Time   04/30/12  5:15 PM      Component Value Range Status Comment   Specimen Description ABSCESS ABDOMEN   Final    Special Requests 4 SYRINGES @ 60CC   Final    Fungal Smear NO YEAST OR FUNGAL ELEMENTS SEEN   Final    Culture CANDIDA TROPICALIS   Final    Report Status PENDING   Incomplete   CULTURE,  ROUTINE-ABSCESS     Status: Normal   Collection Time   04/30/12  5:15 PM      Component Value Range Status Comment   Specimen Description ABSCESS ABDOMEN   Final    Special Requests 4 SYRINGES @ 60CC   Final    Gram Stain     Final    Value: ABUNDANT WBC PRESENT, PREDOMINANTLY PMN     NO SQUAMOUS EPITHELIAL CELLS SEEN     ABUNDANT GRAM POSITIVE COCCI IN PAIRS     IN CLUSTERS   Culture FEW YEAST CONSISTENT WITH CANDIDA SPECIES   Final    Report Status 05/04/2012 FINAL   Final     Studies/Results: No results found.  Anti-infectives: Anti-infectives     Start     Dose/Rate Route Frequency Ordered Stop   05/08/12 1200   fluconazole (DIFLUCAN) IVPB 400 mg        400 mg 200 mL/hr over 60 Minutes Intravenous Every T-Th-Sa (Hemodialysis) 05/07/12 1752     04/30/12 1400   vancomycin (VANCOCIN) 750 mg in sodium chloride 0.9 % 150 mL IVPB  Status:  Discontinued        750 mg 150 mL/hr over 60 Minutes Intravenous  Once 04/30/12 1212 04/30/12 1507   04/29/12 2330   vancomycin (VANCOCIN) 1,500 mg in sodium chloride 0.9 % 500 mL IVPB        1,500 mg 250 mL/hr over 120 Minutes Intravenous  Once 04/29/12 2259 04/30/12 0246   04/27/12 0000   ceFAZolin (ANCEF) IVPB 1 g/50 mL premix  Status:  Discontinued     Comments: Send with pt to OR      1 g 100 mL/hr over 30 Minutes Intravenous On call 04/26/12 0927 04/26/12 0928   04/24/12 1000   micafungin (MYCAMINE) 100 mg in sodium chloride 0.9 % 100 mL IVPB  Status:  Discontinued        100 mg 100 mL/hr over 1 Hours Intravenous Daily 04/24/12 0750 05/07/12 1713   04/23/12 1600   fluconazole (DIFLUCAN) IVPB 400 mg  400 mg 200 mL/hr over 60 Minutes Intravenous  Once 04/23/12 1428 04/23/12 1800   04/22/12 2200   piperacillin-tazobactam (ZOSYN) IVPB 3.375 g  Status:  Discontinued        3.375 g 100 mL/hr over 30 Minutes Intravenous 3 times per day 04/22/12 2120 04/22/12 2137   04/22/12 2200  piperacillin-tazobactam (ZOSYN) IVPB 2.25 g        2.25 g 100 mL/hr over 30 Minutes Intravenous 3 times per day 04/22/12 2140     04/22/12 2157   gentamicin (GARAMYCIN) injection  Status:  Discontinued          As needed 04/22/12 2158 04/22/12 2335   04/22/12 2156   clindamycin (CLEOCIN) injection  Status:  Discontinued          As needed 04/22/12 2157 04/22/12 2335   04/22/12 2145  piperacillin-tazobactam (ZOSYN) IVPB 3.375 g       3.375 g 12.5 mL/hr over 240 Minutes Intravenous  Once 04/22/12 2141 04/23/12 0145   04/22/12 2130   gentamicin (GARAMYCIN) 240 mg, clindamycin (CLEOCIN) 900 mg in sodium chloride irrigation 0.9 % 1,000 mL irrigation  Status:  Discontinued         Irrigation  Once 04/22/12 2119 04/23/12 0020   04/14/12 0830   ertapenem (INVANZ) 1 g in sodium chloride 0.9 % 50 mL IVPB        1 g 100 mL/hr over 30 Minutes Intravenous To Surgery 04/14/12 0816 04/14/12 0834   04/13/12 1359   ertapenem (INVANZ) 1 g in sodium chloride 0.9 % 50 mL IVPB  Status:  Discontinued        1 g 100 mL/hr over 30 Minutes Intravenous 60 min pre-op 04/13/12 1359 04/14/12 0816          Assessment/Plan: s/p RLQ abscess drainage 8/9, upsized 8/14; rec f/u CT  to assess adequacy of drainage in next 1-2 days esp with increasing WBC; pt for left thigh shuntogram today to assess for stenosis (s/p thrombectomy/ revision 8/5)    LOS: 38 days    James Senn,D [facility] 05/10/2012

## 2012-05-10 NOTE — Progress Notes (Signed)
Nutrition Follow-up  Intervention:   1. Continue TPN per pharmacy 2. RD to continue to follow nutrition care plan   Assessment:   Pt seen by wound care RN on 8/16 for abdominal leak and application of Eakins pouch.  Receiving HD on TTS schedule.  300 ml NG tube output yesterday.  Patient continues to receive TPN with Clinimix 5/15 @ 70 ml/hr. Lipids (20% IVFE @ 10 ml/hr), multivitamins, and trace elements are provided 3 times weekly (MWF) due to national backorder. Provides 1400 kcal and 84 grams protein daily (based on weekly average). Meets 82% minimum estimated kcal and 100% minimum estimated protein needs.  Diet Order:  NPO  Meds: Scheduled Meds:    . antiseptic oral rinse  15 mL Mouth Rinse q12n4p  . chlorhexidine  15 mL Mouth Rinse BID  . darbepoetin (ARANESP) injection - DIALYSIS  200 mcg Intravenous Q Sat-HD  . fentaNYL  37.5 mcg Transdermal Q72H  . fluconazole (DIFLUCAN) IV  400 mg Intravenous Q T,Th,Sa-HD  . heparin  40 Units/kg Dialysis Once in dialysis  . heparin  40 Units/kg Dialysis Once in dialysis  . hydrocortisone sod succinate (SOLU-CORTEF) injection  25 mg Intravenous Daily  .  HYDROmorphone (DILAUDID) injection  1 mg Intravenous Once  . insulin aspart  0-15 Units Subcutaneous Q4H  . lip balm   Topical BID  . metoprolol  5 mg Intravenous Q6H  . pantoprazole (PROTONIX) IV  40 mg Intravenous Q24H  . piperacillin-tazobactam (ZOSYN)  IV  2.25 g Intravenous Q8H  . sodium chloride  10-40 mL Intracatheter Q12H   Continuous Infusions:    . TPN (CLINIMIX) +/- additives 70 mL/hr at 05/08/12 1724  . TPN (CLINIMIX) +/- additives 70 mL/hr at 05/09/12 1824   PRN Meds:.acetaminophen, acetaminophen, albuterol, feeding supplement (NEPRO CARB STEADY), heparin, heparin, heparin, heparin, heparin, hydrALAZINE, lidocaine, lidocaine-prilocaine, morphine injection, nitroGLYCERIN, ondansetron (ZOFRAN) IV, ondansetron, pentafluoroprop-tetrafluoroeth, promethazine, sodium chloride,  DISCONTD: sodium chloride, DISCONTD: sodium chloride  Labs:  CMP     Component Value Date/Time   NA 126* 05/08/2012 0830   K 3.3* 05/08/2012 0830   CL 88* 05/08/2012 0830   CO2 20 05/08/2012 0830   GLUCOSE 89 05/08/2012 0830   BUN 77* 05/08/2012 0830   CREATININE 5.63* 05/08/2012 0830   CALCIUM 9.8 05/08/2012 0830   PROT 5.5* 05/08/2012 0830   ALBUMIN 1.6* 05/08/2012 0830   AST 24 05/08/2012 0830   ALT 23 05/08/2012 0830   ALKPHOS 118* 05/08/2012 0830   BILITOT 1.7* 05/08/2012 0830   GFRNONAA 10* 05/08/2012 0830   GFRAA 12* 05/08/2012 0830   Phosphorus  Date/Time Value Range Status  05/08/2012  8:30 AM 3.1  2.3 - 4.6 mg/dL Final  1/61/0960  4:54 AM 3.6  2.3 - 4.6 mg/dL Final  0/98/1191  4:78 AM 3.6  2.3 - 4.6 mg/dL Final   Potassium  Date/Time Value Range Status  05/08/2012  8:30 AM 3.3* 3.5 - 5.1 mEq/L Final  05/07/2012  9:30 AM 3.9  3.5 - 5.1 mEq/L Final  05/06/2012  8:09 AM 3.2* 3.5 - 5.1 mEq/L Final   Magnesium  Date/Time Value Range Status  05/06/2012  4:50 AM 1.6  1.5 - 2.5 mg/dL Final  2/95/6213  0:86 AM 1.7  1.5 - 2.5 mg/dL Final  01/27/8468  6:29 AM 1.6  1.5 - 2.5 mg/dL Final   Prealbumin  Date/Time Value Range Status  05/03/2012  5:35 AM 25.1  17.0 - 34.0 mg/dL Final  01/22/8412  2:44 AM 7.7* 17.0 -  34.0 mg/dL Final  12/29/8117  1:47 AM 9.5* 17.0 - 34.0 mg/dL Final    Intake/Output Summary (Last 24 hours) at 05/10/12 0812 Last data filed at 05/10/12 8295  Gross per 24 hour  Intake   2090 ml  Output    455 ml  Net   1635 ml  BM on 8/9  Weight Status:  54 kg s/p HD 8/17 - trending back down 63.8 kg s/p HD on 8/12 65.7 kg s/p HD on 8/8  50.5 kg s/p HD on 8/6 50 kg s/p HD on 8/5 47.8 kg s/p HD on 7/30  48.3 kg s/p HD on 7/27  Body mass index is 16.60 kg/(m^2). Underweight.  Estimated needs:  [calculated using the Kidney Disease Outcomes Quality Initiative (KDOQI) Adjustment Equation for the Underweight Patient]: 1700 - 1950 kcal, 80 - 100 grams protein daily  Nutrition  Dx:  Inadequate oral intake now R/T GI distress AEB pt report. Ongoing.  Goal:  Initiation of TPN; intake to meet at least 90% of estimated needs. Unmet.  Monitor:  Diet advancement, TPN adequacy, weights, labs, I/O's  Jarold Motto MS, RD, LDN Pager: [phone] After-hours pager: [phone]

## 2012-05-10 NOTE — Progress Notes (Signed)
ANTIBIOTIC CONSULT NOTE - FOLLOW UP  Pharmacy Consult for Fluconazole and Zosyn Indication: abdominal abscess with fistula  Allergies  Allergen Reactions  . Allopurinol     REACTION: decreased platelets  . Aspirin     REACTION: unspecified    Patient Measurements: Height: 5\' 11"  (180.3 cm) Weight: 119 lb 0.8 oz (54 kg) IBW/kg (Calculated) : 75.3   Vital Signs: Temp: 97.7 F (36.5 C) (08/19 0723) Temp src: Oral (08/19 0723) BP: 143/81 mmHg (08/19 0843) Pulse Rate: 94  (08/19 0843) Intake/Output from previous day: 08/18 0701 - 08/19 0700 In: 2250 [I.V.:460; IV Piggyback:100; TPN:1680] Out: 455 [Emesis/NG output:300; Drains:155]  Labs:  [facility] 05/10/12 0514 05/08/12 0830 05/07/12 0930  WBC 21.6* 13.9* 15.5*  HGB 11.0* 11.5* 11.8*  PLT 126* 156 136*  LABCREA -- -- --  CREATININE -- 5.63* 4.25*   Assessment:   Pt with abd abscess that has been on zosyn and now fluconazole for a while. Has drain in place. WBC trending up again after trending down for several days. Prob going to need long term abx per ID. They have signed off.     8/1 Zosyn>>   8/3 Micafungin >> 8/16   8/2 Fluconazole 400 mg IV x 1 >>resume 8/17  Goal of Therapy:    Appropriate doses for renal function and infection  Plan:    Fluconazole 400 mg IV after each hemodialysis, Tuesday-Thursday-Saturday.  First dose 8/17.   Continue Zosyn 2.25 grams IV q8 hrs.

## 2012-05-10 NOTE — Progress Notes (Signed)
Notified MD that patient is having acute increase in pain at abdominal incision not relieved by morphine 2mg  and that drainage from Eakin pouch appears more red in color than before; 130 ml dark red/brown fluid emptied.  Also patient complaining of new pain under scrotum, no evidence of trauma/injury there, no edema. Vital signs 97.7, 148/57, HR 71, 100% on room air.

## 2012-05-10 NOTE — Progress Notes (Signed)
Report received from HD nurse. @ 2000 Patient returned to room via bed accompanied by HD nurse. Patient alert and oriented, in no distress.

## 2012-05-10 NOTE — Progress Notes (Signed)
Pt taken to interventional radiology at 1100hrs for shuntogram.

## 2012-05-10 NOTE — Progress Notes (Signed)
Pt taken to HD via bed at 1505hrs.

## 2012-05-11 ENCOUNTER — Inpatient Hospital Stay (HOSPITAL_COMMUNITY): Payer: Medicare Other

## 2012-05-11 LAB — CBC
Hemoglobin: 10.6 g/dL — ABNORMAL LOW (ref 13.0–17.0)
MCV: 86.2 fL (ref 78.0–100.0)
Platelets: 108 10*3/uL — ABNORMAL LOW (ref 150–400)
RBC: 3.63 MIL/uL — ABNORMAL LOW (ref 4.22–5.81)
WBC: 13.5 10*3/uL — ABNORMAL HIGH (ref 4.0–10.5)

## 2012-05-11 LAB — GLUCOSE, CAPILLARY: Glucose-Capillary: 95 mg/dL (ref 70–99)

## 2012-05-11 LAB — COMPREHENSIVE METABOLIC PANEL
ALT: 18 U/L (ref 0–53)
AST: 20 U/L (ref 0–37)
CO2: 23 mEq/L (ref 19–32)
Chloride: 95 mEq/L — ABNORMAL LOW (ref 96–112)
Creatinine, Ser: 4.25 mg/dL — ABNORMAL HIGH (ref 0.50–1.35)
GFR calc non Af Amer: 15 mL/min — ABNORMAL LOW (ref >90)
Sodium: 132 mEq/L — ABNORMAL LOW (ref 135–145)
Total Bilirubin: 1.7 mg/dL — ABNORMAL HIGH (ref 0.3–1.2)

## 2012-05-11 MED ORDER — INSULIN REGULAR HUMAN 100 UNIT/ML IJ SOLN
INTRAVENOUS | Status: AC
  Administered 2012-05-11: 18:00:00 via INTRAVENOUS
  Filled 2012-05-11: qty 2000

## 2012-05-11 MED ORDER — MORPHINE SULFATE 2 MG/ML IJ SOLN
INTRAMUSCULAR | Status: AC
  Administered 2012-05-11: 2 mg via INTRAVENOUS
  Filled 2012-05-11: qty 1

## 2012-05-11 NOTE — Progress Notes (Signed)
14 Days Post-Op  Subjective: In HD, abdominal pain has improved  Objective: Vital signs in last 24 hours: Temp:  [96.9 F (36.1 C)-98.8 F (37.1 C)] 97.5 F (36.4 C) (08/20 0650) Pulse Rate:  [84-135] 113  (08/20 0930) Resp:  [12-20] 16  (08/20 0930) BP: (95-143)/(48-80) 100/60 mmHg (08/20 0930) SpO2:  [95 %-100 %] 98 % (08/20 0650) Weight:  [54.7 kg (120 lb 9.5 oz)-56.2 kg (123 lb 14.4 oz)] 55.2 kg (121 lb 11.1 oz) (08/20 0650) Last BM Date:  (pt cannot recall)  Intake/Output from previous day: 08/19 0701 - 08/20 0700 In: 1790 [IV Piggyback:110; TPN:1580] Out: 2302 [Emesis/NG output:250; Drains:70] Intake/Output this shift:    General appearance: alert Nose: NGT Resp: clear to auscultation bilaterally Cardio: S1, S2 normal GI: Eakins pouch with bilious output, soft, nontender, drain also with enteric output Extremities: no edema  Lab Results:   Basename 05/11/12 0720 05/10/12 0514  WBC 13.5* 21.6*  HGB 10.6* 11.0*  HCT 31.3* 33.4*  PLT 108* 126*   BMET  Basename 05/11/12 0720  NA 132*  K 3.3*  CL 95*  CO2 23  GLUCOSE 108*  BUN 49*  CREATININE 4.25*  CALCIUM 9.5   PT/INR No results found for this basename: LABPROT:2,INR:2 in the last 72 hours ABG No results found for this basename: PHART:2,PCO2:2,PO2:2,HCO3:2 in the last 72 hours  Studies/Results: Ir Shuntogram/ Fistulagram Left Mod Sed  05/10/2012  *RADIOLOGY REPORT*  Clinical Data: Left leg swelling.  Graft revision 2 weeks ago.  AV SHUNTOGRAM  Procedure:  The left thigh was prepped and draped in a sterile fashion.  An 18 gauge Angiocath was inserted into the AV graft. Contrast was injected.  The Angiocath was removed.  Hemostasis was achieved with direct pressure. No complication.  Findings: The arterial anastomosis and graft are widely patent. There is moderate narrowing at the venous anastomosis.  There is associated narrowing of the external iliac vein.  Central venous structures are otherwise patent   IMPRESSION: Venous anastomotic and external iliac vein narrowing.  Otherwise patent circuit.   Original Report Authenticated By: Donavan Burnet, M.D. ( 05/10/2012 13:05:15 )     Anti-infectives: Anti-infectives     Start     Dose/Rate Route Frequency Ordered Stop   05/08/12 1200   fluconazole (DIFLUCAN) IVPB 400 mg        400 mg 200 mL/hr over 60 Minutes Intravenous Every T-Th-Sa (Hemodialysis) 05/07/12 1752     04/30/12 1400   vancomycin (VANCOCIN) 750 mg in sodium chloride 0.9 % 150 mL IVPB  Status:  Discontinued        750 mg 150 mL/hr over 60 Minutes Intravenous  Once 04/30/12 1212 04/30/12 1507   04/29/12 2330   vancomycin (VANCOCIN) 1,500 mg in sodium chloride 0.9 % 500 mL IVPB        1,500 mg 250 mL/hr over 120 Minutes Intravenous  Once 04/29/12 2259 04/30/12 0246   04/27/12 0000   ceFAZolin (ANCEF) IVPB 1 g/50 mL premix  Status:  Discontinued     Comments: Send with pt to OR      1 g 100 mL/hr over 30 Minutes Intravenous On call 04/26/12 0927 04/26/12 0928   04/24/12 1000   micafungin (MYCAMINE) 100 mg in sodium chloride 0.9 % 100 mL IVPB  Status:  Discontinued        100 mg 100 mL/hr over 1 Hours Intravenous Daily 04/24/12 0750 05/07/12 1713   04/23/12 1600   fluconazole (DIFLUCAN) IVPB 400 mg  400 mg 200 mL/hr over 60 Minutes Intravenous  Once 04/23/12 1428 04/23/12 1800   04/22/12 2200   piperacillin-tazobactam (ZOSYN) IVPB 3.375 g  Status:  Discontinued        3.375 g 100 mL/hr over 30 Minutes Intravenous 3 times per day 04/22/12 2120 04/22/12 2137   04/22/12 2200   piperacillin-tazobactam (ZOSYN) IVPB 2.25 g        2.25 g 100 mL/hr over 30 Minutes Intravenous 3 times per day 04/22/12 2140     04/22/12 2157   gentamicin (GARAMYCIN) injection  Status:  Discontinued          As needed 04/22/12 2158 04/22/12 2335   04/22/12 2156   clindamycin (CLEOCIN) injection  Status:  Discontinued          As needed 04/22/12 2157 04/22/12 2335   04/22/12 2145    piperacillin-tazobactam (ZOSYN) IVPB 3.375 g        3.375 g 12.5 mL/hr over 240 Minutes Intravenous  Once 04/22/12 2141 04/23/12 0145   04/22/12 2130   gentamicin (GARAMYCIN) 240 mg, clindamycin (CLEOCIN) 900 mg in sodium chloride irrigation 0.9 % 1,000 mL irrigation  Status:  Discontinued         Irrigation  Once 04/22/12 2119 04/23/12 0020   04/14/12 0830   ertapenem (INVANZ) 1 g in sodium chloride 0.9 % 50 mL IVPB        1 g 100 mL/hr over 30 Minutes Intravenous To Surgery 04/14/12 0816 04/14/12 0834   04/13/12 1359   ertapenem (INVANZ) 1 g in sodium chloride 0.9 % 50 mL IVPB  Status:  Discontinued        1 g 100 mL/hr over 30 Minutes Intravenous 60 min pre-op 04/13/12 1359 04/14/12 0816          Assessment/Plan: s/p Procedure(s) (LRB): THROMBECTOMY ARTERIOVENOUS GORE-TEX GRAFT (Left) REVISION OF ARTERIOVENOUS GORETEX GRAFT (Left) S/P SBR with EC fistula - continue Eakins pouch and IR drain. Pain and WBC better today. Further surgery not an option at this time according to my partners that did his last surgery.  Hopefully with adequate drainage this will close. Will consider D/C NGT and give sips tomorrow ID - Zosyn FEN - TNA ESRD - per renal  LOS: 39 days    Moani Weipert E 05/11/2012

## 2012-05-11 NOTE — Progress Notes (Signed)
Patient transported to HD via bed and portable monitor, accompanied by hemodialysis staff AP and JM. Patient alert, oriented, and in no distress.

## 2012-05-11 NOTE — Procedures (Signed)
I was present at this session.  I have reviewed the session itself and made appropriate changes.  Dialysis tolerating vol off and clearance  [name] L 8/20/20137:41 AM

## 2012-05-11 NOTE — Progress Notes (Signed)
PARENTERAL NUTRITION/ABX CONSULT NOTE - FOLLOW UP  Pharmacy Consult:  TPN Indication: SBO with new enteric fistula  Allergies  Allergen Reactions  . Allopurinol     REACTION: decreased platelets  . Aspirin     REACTION: unspecified   Patient Measurements: Height: 5\' 11"  (180.3 cm) Weight: 121 lb 4.1 oz (55 kg) IBW/kg (Calculated) : 75.3   Vital Signs: Temp: 98.7 F (37.1 C) (08/20 0400) Temp src: Oral (08/20 0400) BP: 122/62 mmHg (08/20 0600) Pulse Rate: 95  (08/20 0620) Intake/Output from previous day: 08/19 0701 - 08/20 0700 In: 1790 [IV Piggyback:110; TPN:1580] Out: 2302 [Emesis/NG output:250; Drains:70]  Labs:  [facility] 05/10/12 0514 05/08/12 0830  WBC 21.6* 13.9*  HGB 11.0* 11.5*  HCT 33.4* 34.6*  PLT 126* 156  APTT -- --  INR -- --    Basename 05/07/12 0930 05/06/12 0809 05/06/12 0450  NA 128* 124* --  K 3.9 3.2* --  CL 91* 86* --  CO2 22 20 --  GLUCOSE 115* 116* --  BUN 51* 78* --  CREATININE 4.25* 5.69* --  CALCIUM 9.5 10.1 --  MG -- -- 1.6  PHOS -- 3.6 3.6  ALBUMIN -- 1.7* --   Estimated Creatinine Clearance: 11.9 ml/min (by C-G formula based on Cr of 5.63).   Basename 05/11/12 0443 05/11/12 0034 05/10/12 2005  GLUCAP 113* 114* 154*    Insulin Requirements in the past 12 hours:  7 units SSI + 20 units regular insulin in TNA.    Nutritional Goals:  1700-1950 kCal, 75-85 grams of protein per day  Current Nutrition:  Clinimix 5/15 at 70 ml/hr with 20% lipids at 10 ml/hr MWF provides an average of 1398 kcal and 84g protein per day  Admit:  Admitted 7/13 with abd pain, weight loss, SBO. S/p LOA, SBR 7/24 and repeat LOA, SBR with anasomotic leak repair 8/1, thrombectomy with AVG revision 7/25 and repeat thrombectomy 8/6.  CT on 8/13 reveals enlarging abscess and enteric fistula.   GI: NGT and wound drainage O/P decreasing; tolerating TPN Endo:  CBGs fairly controlled (goal < 150).  On moderate SSI q4 + 20 units insulin in TPN. Solu-Cortef  being tapered. Lytes: low K+ / Na+ / Cl-, hypercalcemia (corrected Ca 11.58, Ca x PO4 = 27.8, goal < 55), (on 4K bath, no KCL supplementation today per Renal). Renal:  ESRD with HD typically TTS - currently receiving HD d/t fluid overload with ongoing TPN.  HD is sporadic based on volume issues - received HD x 4 hrs on 8/19. On Aranesp qSat.  Currently receiving 1.1 g/kg protein daily, goal ~1.5 g/kg while on HD. Pulm: extubated on 8/1; stable on RA Cards: BP trending down; hypotensive during HD sessions and unable to tolerate UF. HR mostly controlled. C/o CP on 8/14 unrelieved with NTG x3 doses, tropnin 1.76 -- thought to be related to demand ischemia. No further CP this AM.  On scheduled lopressor and prn hydralazine. Hepatobil: Alk phos and tbili are elevated. Albumin is low. Prealbumin is at goal. TC / TG trending up. Neuro: GCS 15, no c/o pain - on fentanyl patch, prn pain meds. ID: Fluconazole + Zosyn for peritonitis/abd abscess -- ID following. Afeb, WBC decreasing.   Abd abscess culture: candida tropicalis, last note says sens to azoles but not showing in cx report. Drain repositioned and removed 200 cc of brown fluid from abscess on 8/14 -- not sent for culturing.   Zosyn 8.1>> Diflucan 8.16>>  Best Practices:  Mouthcare, PPI-IV  Plan:  - Increase Clinimix 5/15 to 83 ml/hr to better meet protein and kCal goals.  New rate to provide an average daily provision of 100 gm protein (1.3 g/kg) and 1620 kCal. - Increase insulin in TPN to 25 units/bag d/t rate increase - Provide available trace elements, MVI and lipids @10cc /hr on MWF only d/t national backorder - Monitor plts, lytes, CBGs, volume status      [name], PharmD, BCPS Pager:  [phone] 05/11/2012, 9:11 AM

## 2012-05-11 NOTE — Progress Notes (Signed)
Subjective: Interval History: has complaints doing better with pain, very weak..  Objective: Vital signs in last 24 hours: Temp:  [96.9 F (36.1 C)-98.8 F (37.1 C)] 98.7 F (37.1 C) (08/20 0400) Pulse Rate:  [84-135] 95  (08/20 0620) Resp:  [12-20] 13  (08/20 0620) BP: (95-147)/(48-81) 122/62 mmHg (08/20 0600) SpO2:  [95 %-100 %] 98 % (08/20 0620) Weight:  [54.7 kg (120 lb 9.5 oz)-56.2 kg (123 lb 14.4 oz)] 55 kg (121 lb 4.1 oz) (08/20 0400) Weight change: 2.2 kg (4 lb 13.6 oz)  Intake/Output from previous day: 08/19 0701 - 08/20 0700 In: 1790 [IV Piggyback:110; TPN:1580] Out: 2302 [Emesis/NG output:250; Drains:70] Intake/Output this shift:    General appearance: alert, cooperative and cachectic Resp: rales bibasilar Cardio: S1, S2 normal and systolic murmur: systolic ejection 2/6, decrescendo at 2nd left intercostal space GI: has a few bs, ostomy upper mid abdm, wound lower abdm Extremities: AVG L groin  Lab Results:  Basename 05/10/12 0514 05/08/12 0830  WBC 21.6* 13.9*  HGB 11.0* 11.5*  HCT 33.4* 34.6*  PLT 126* 156   BMET:  Basename 05/08/12 0830  NA 126*  K 3.3*  CL 88*  CO2 20  GLUCOSE 89  BUN 77*  CREATININE 5.63*  CALCIUM 9.8   No results found for this basename: PTH:2 in the last 72 hours Iron Studies: No results found for this basename: IRON,TIBC,TRANSFERRIN,FERRITIN in the last 72 hours  Studies/Results: Ir Shuntogram/ Fistulagram Left Mod Sed  05/10/2012  *RADIOLOGY REPORT*  Clinical Data: Left leg swelling.  Graft revision 2 weeks ago.  AV SHUNTOGRAM  Procedure:  The left thigh was prepped and draped in a sterile fashion.  An 18 gauge Angiocath was inserted into the AV graft. Contrast was injected.  The Angiocath was removed.  Hemostasis was achieved with direct pressure. No complication.  Findings: The arterial anastomosis and graft are widely patent. There is moderate narrowing at the venous anastomosis.  There is associated narrowing of the  external iliac vein.  Central venous structures are otherwise patent  IMPRESSION: Venous anastomotic and external iliac vein narrowing.  Otherwise patent circuit.   Original Report Authenticated By: Donavan Burnet, M.D. ( 05/10/2012 13:05:15 )     I have reviewed the patient's current medications.  Assessment/Plan: 1 CRF on HD vol better with back to back.  Large vol being given.  2 Anemia stable post transfusion 3 Nutrition severe protein cal malnutrition . Hopefully can use bowel soon. 4 ^ WBC on AB, ? Repeat CT.  May need reop 5 Gout inactive 6 Hep C inactive. 7 Severe Debill  P HD, epo, AB drain, ? Re CT    LOS: 39 days   Lametria Klunk L 05/11/2012,7:42 AM

## 2012-05-11 NOTE — Progress Notes (Signed)
Subjective: Pt seen in HD No new c/o Abd pain seems to be better  Objective: Physical Exam: BP 110/55  Pulse 108  Temp 97.5 F (36.4 C) (Oral)  Resp 16  Ht 5\' 11"  (1.803 m)  Wt 121 lb 11.1 oz (55.2 kg)  BMI 16.97 kg/m2  SpO2 98% RLQ drain intact Greenish/brown enteric appearing output    Labs: CBC  Basename 05/11/12 0720 05/10/12 0514  WBC 13.5* 21.6*  HGB 10.6* 11.0*  HCT 31.3* 33.4*  PLT 108* 126*   BMET  Basename 05/11/12 0720  NA 132*  K 3.3*  CL 95*  CO2 23  GLUCOSE 108*  BUN 49*  CREATININE 4.25*  CALCIUM 9.5   LFT  Basename 05/11/12 0720  PROT 5.1*  ALBUMIN 1.4*  AST 20  ALT 18  ALKPHOS 105  BILITOT 1.7*  BILIDIR --  IBILI --  LIPASE --   PT/INR No results found for this basename: LABPROT:2,INR:2 in the last 72 hours   Studies/Results: Ir Shuntogram/ Fistulagram Left Mod Sed  05/10/2012  *RADIOLOGY REPORT*  Clinical Data: Left leg swelling.  Graft revision 2 weeks ago.  AV SHUNTOGRAM  Procedure:  The left thigh was prepped and draped in a sterile fashion.  An 18 gauge Angiocath was inserted into the AV graft. Contrast was injected.  The Angiocath was removed.  Hemostasis was achieved with direct pressure. No complication.  Findings: The arterial anastomosis and graft are widely patent. There is moderate narrowing at the venous anastomosis.  There is associated narrowing of the external iliac vein.  Central venous structures are otherwise patent  IMPRESSION: Venous anastomotic and external iliac vein narrowing.  Otherwise patent circuit.   Original Report Authenticated By: Donavan Burnet, M.D. ( 05/10/2012 13:05:15 )     Assessment/Plan: RLQ abscess s/p perc drain 8/14 Now with controlled EC fistula WBC down Cont to follow along with CCS   LOS: 39 days    Brandilynn Taormina PA-C 05/11/2012 10:45 AM  Pt

## 2012-05-11 NOTE — Consult Note (Signed)
Wound care follow-up:  Eakin pouch intact with good seal.  Mod amt bloody drainage inside pouch.  Supplies at bedside for staff use if leaking occurs.   Cammie Mcgee, RN, MSN, [employer]  [phone]

## 2012-05-11 NOTE — Progress Notes (Signed)
[facility] PROGRESS NOTE  [name] QMV:[id number] DOB: January 14, 1961 DOA: 04/02/2012 PCP: Trevor Iha, MD  Assessment/Plan:  SBO (small bowel obstruction) s/p lysis of adhesions and partial small bowel resection 7/24 *Subsequent anastomotic leak- omental patch of anastomosis 8/2  *CT abdomen and pelvis 04/29/2012 demonstrated several fluid collections consistent with intra-abdominal abscess with the largest measuring 16.5 x 13.7 x 6.4 cm.  *Patient underwent percutaneous drainage of largest fluid collection 04/30/2012 *Repeat CT done for increasing WBC count on 8/13 reveals enlarging abscess and enteric fistula and obstructed drainage tube. He underwent drain reposition on 8/14 *Has open abdom wound which is connecting with abscess cavity-  Still having bloody drainage in Eakins pouch- -drainage that was heme-positive 05/10/2012. Fortunately the severe abdominal pain from yesterday has resolved. *His white cell count had increased back up to 21,600 but today has decreased back down to 13,500.  *his hemoglobin currently is at 10.6, platelets had decreased to 126,000 on 05/10/2012 and have further trended downward to 108,000 today. 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 Will repeat doppler in AM.   Ileus following gastrointestinal surgery *Cont TNA via right femoral Vas-Cath port per surgery  Atypical chest pain/abnormal cardiac enzyme *Having episodes of severe chest pain during this hospitalization. CT of the chest revealed no evidence of pulmonary embolism or aortic dissection *Suspect mildly elevated cardiac isoenzymes related to demand ischemia versus recent stressors of recurrent surgical procedures  CKD (chronic kidney disease) stage V requiring chronic dialysis *Dialysis per nephrology  Hypokalemia/hypophosphatemia/hyponatremia *Management per nephrology service/TNA  Thrombocytopenia *See above  Normocytic Anemia  *Stable. Due to acute blood loss and due to chronic disease-see above regarding wound bleeding *Has received a total of 4 units of packed red blood cells this admission *See above regarding hemoglobin into bleeding from open abdominal wound *Cont aranesp and ferric gluconate  HTN (hypertension) Fair control- IV Metoprolol  Chronic use of steroids Steroid being slowly tapered down  Malnutrition, calorie *Patient cachectic at presentation  *Parenteral nutrition managed by surgery and pharmacy  Leg dialysis graft occlusion *s/p thrombectomy 7/25 - unfortunately re clotted-has undergone an additional thrombectomy procedure with revision of arterial and graft on 04/27/2012 *Was being dialyzed via HD cath in right groin but now graft is being used again *See discussion per vascular surgery in their note today concerning narrowing at the venous anastomosis  DVT prophylaxis: SCDs Code Status: full  Disposition Plan: Remain in step down  Consultants: General Surgery [facility] Infectious  disease  Events since admission: 7/13 CT Abdomen with SBO  7/24 Ex-lap, lysis of adhesions, partial small bowel resection x2  7/25 Thrombectomy L femoral AV graft  7/26 Extubated, CVL is out  7/27 Transferred to telemetry under [facility]  8/2 Acute abdomen, dehiscence  of anastomosis, SBO, back from OR intubated in ICU on PCCM service. Found to have moderate peritonitis infraumbilically 8/2 Extubated  Antibiotics: Zosyn 8/1 >>> Micafungin 8/3 >> Invanz 7/24- periop Fluconazole- 8/2- one dose Vancomycin 8/8 >>> 05/01/2012  HPI/Subjective: No complaints- flat affect. States previous abdominal pain has resolved  Objective: Filed Vitals:   05/11/12 0815 05/11/12 0830 05/11/12 0900 05/11/12 0915  BP: 105/63 107/63 98/61 96/57   Pulse: 115 114 118 120  Temp:      TempSrc:      Resp: 15 14 20 17   Height:      Weight:      SpO2:        Intake/Output Summary (Last 24 hours) at 05/11/12 1610 Last data filed at 05/11/12 0600  Gross per 24 hour  Intake   1630 ml  Output   2282 ml  Net   -652 ml    Exam:   General:  Alert, appears calm, in no acute distress, sleeping during dialysis treatment  Cardiovascular: RRR, sinus rhythm, has grade 2/6 systolic murmur left sternal border fourth intercostal space, has unilateral left leg edema which appears to be stable  Respiratory: CTA bilaterally, no w/r/r. Normal respiratory effort.  Abdomen: Soft and only minimally tender in the right lower quadrant, percutaneous drain on right shows bilious drainage, midline vertical incision with eating spouse continues with moderate volume melanotic drainage  Musculoskeletal: Extremities are symmetrical except for previously mentioned left lower extremity edema, no joint effusion or traumatic injuries, no peripheral cyanosis  Neurological: Patient is easily arousable, exam is nonfocal, exam limited because patient is currently stretching hemodialysis  Data Reviewed: Basic Metabolic Panel:  Lab  05/11/12 0720 05/08/12 0830 05/07/12 0930 05/06/12 0809 05/06/12 0450 05/05/12 0635  NA 132* 126* 128* 124* -- 132*  K 3.3* 3.3* 3.9 3.2* -- 3.4*  CL 95* 88* 91* 86* -- 92*  CO2 23 20 22 20  -- 23  GLUCOSE 108* 89 115* 116* -- 120*  BUN 49* 77* 51* 78* -- 45*  CREATININE 4.25* 5.63* 4.25* 5.69* -- 4.02*  CALCIUM 9.5 9.8 9.5 10.1 -- 9.8  MG -- -- -- -- 1.6 --  PHOS 2.4 3.1 -- 3.6 3.6 3.0   Liver Function Tests:  Lab 05/11/12 0720 05/08/12 0830 05/06/12 0809  AST 20 24 --  ALT 18 23 --  ALKPHOS 105 118* --  BILITOT 1.7* 1.7* --  PROT 5.1* 5.5* --  ALBUMIN 1.4* 1.6* 1.7*   CBC:  Lab 05/11/12 0720 05/10/12 0530 05/10/12 0514 05/08/12 0830 05/07/12 0930 05/07/12 0645  WBC 13.5* -- 21.6* 13.9* 15.5* 13.9*  NEUTROABS -- 19.0* -- -- -- --  HGB 10.6* -- 11.0* 11.5* 11.8* 11.7*  HCT 31.3* -- 33.4* 34.6* 35.6* 36.2*  MCV 86.2 -- 84.6 85.0 88.1 87.2  PLT 108* -- 126* 156 136* 133*   CBG:  Lab 05/11/12 0443 05/11/12 0034 05/10/12 2005 05/10/12 1307 05/10/12 0759  GLUCAP 113* 114* 154* 115* 145*    No results found for this or any previous visit (from the past 240 hour(s)).  Scheduled Meds: reviewed Continuous Infusions: reviewed _________________________________________________________________  05/11/2012, 9:22 AM  LOS: 39 days    Junious Silk, ANP [facility] 1 [phone]    I have examined the patient and reviewed the chart. I agree with the above note which I have modified.   Calvert Cantor, MD [phone]

## 2012-05-12 DIAGNOSIS — M7989 Other specified soft tissue disorders: Secondary | ICD-10-CM

## 2012-05-12 LAB — BASIC METABOLIC PANEL
Chloride: 96 mEq/L (ref 96–112)
GFR calc Af Amer: 21 mL/min — ABNORMAL LOW (ref >90)
Potassium: 3.2 mEq/L — ABNORMAL LOW (ref 3.5–5.1)

## 2012-05-12 LAB — GLUCOSE, CAPILLARY
Glucose-Capillary: 123 mg/dL — ABNORMAL HIGH (ref 70–99)
Glucose-Capillary: 132 mg/dL — ABNORMAL HIGH (ref 70–99)

## 2012-05-12 MED ORDER — SODIUM CHLORIDE 0.9 % IV SOLN: 100.0000 mL | INTRAVENOUS | Status: DC | PRN

## 2012-05-12 MED ORDER — ZINC TRACE METAL 1 MG/ML IV SOLN
INTRAVENOUS | Status: AC
  Administered 2012-05-12: 17:00:00 via INTRAVENOUS
  Filled 2012-05-12: qty 2000

## 2012-05-12 MED ORDER — FAT EMULSION 20 % IV EMUL
250.0000 mL | INTRAVENOUS | Status: AC
  Administered 2012-05-12: 250 mL via INTRAVENOUS
  Filled 2012-05-12: qty 250

## 2012-05-12 MED ORDER — ALTEPLASE 2 MG IJ SOLR: 2.0000 mg | Freq: Once | INTRAMUSCULAR | Status: DC | PRN

## 2012-05-12 MED ORDER — HEPARIN SODIUM (PORCINE) 1000 UNIT/ML DIALYSIS: 1000.0000 [IU] | INTRAMUSCULAR | Status: DC | PRN

## 2012-05-12 MED ORDER — LIDOCAINE-PRILOCAINE 2.5-2.5 % EX CREA: 1.0000 "application " | TOPICAL_CREAM | CUTANEOUS | Status: DC | PRN

## 2012-05-12 MED ORDER — PENTAFLUOROPROP-TETRAFLUOROETH EX AERO: 1.0000 "application " | INHALATION_SPRAY | CUTANEOUS | Status: DC | PRN

## 2012-05-12 MED ORDER — LIDOCAINE HCL (PF) 1 % IJ SOLN: 5.0000 mL | INTRAMUSCULAR | Status: DC | PRN

## 2012-05-12 MED ORDER — NEPRO/CARBSTEADY PO LIQD: 237.0000 mL | ORAL | Status: DC | PRN

## 2012-05-12 MED ORDER — HEPARIN SODIUM (PORCINE) 1000 UNIT/ML DIALYSIS
40.0000 [IU]/kg | Freq: Once | INTRAMUSCULAR | Status: AC
  Administered 2012-05-13: 2100 [IU] via INTRAVENOUS_CENTRAL
  Filled 2012-05-12: qty 3

## 2012-05-12 NOTE — Progress Notes (Signed)
VASCULAR LAB PRELIMINARY  PRELIMINARY  PRELIMINARY  PRELIMINARY  Left lower extremity venous duplex completed.    Preliminary report:  Left:  No evidence of DVT, superficial thrombosis, or Baker's cyst.  [name], RVT 05/12/2012, 1:06 PM

## 2012-05-12 NOTE — Progress Notes (Signed)
[facility] PROGRESS NOTE  GUSS FARRUGGIA ZOX:[id number] DOB: Jun 05, 1961 DOA: 04/02/2012 PCP: Trevor Iha, MD  Assessment/Plan:  SBO (small bowel obstruction) s/p lysis of adhesions and partial small bowel resection 7/24 *Subsequent anastomotic leak- omental patch of anastomosis 8/2  *CT abdomen and pelvis 04/29/2012 demonstrated several fluid collections consistent with intra-abdominal abscess with the largest measuring 16.5 x 13.7 x 6.4 cm.  *Patient underwent percutaneous drainage of largest fluid collection 04/30/2012 *Repeat CT done for increasing WBC count on 8/13 revealed enlarging abscess and enteric fistula and obstructed drainage tube. 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 *An attempt was made on 04/21/2012 to place a central line in interventional radiology under fluoroscopy but unfortunately central venous access could not be placed due to upper extremity and internal jugular vein access being chronically occluded bilaterally therefore rationale for why we have to continue to use this patient's right femoral Vas-Cath port for parenteral nutrition  Chronic nonocclusive left lower extremity DVT Diagnosed via Doppler 04/30/2012 - he continues with left lower extermity edema- possibility that DVT is extending - f/u doppler pending  Ileus following gastrointestinal surgery *Cont TNA via right femoral Vas-Cath port per surgery  Atypical chest pain/abnormal cardiac enzyme *Having episodes of severe  chest pain during this hospitalization. CT of the chest revealed no evidence of pulmonary embolism or aortic dissection *Suspect mildly elevated cardiac isoenzymes related to demand ischemia versus recent stressors of recurrent surgical procedures  CKD (chronic kidney disease) stage V requiring chronic dialysis *Dialysis per nephrology  Hypokalemia/hypophosphatemia/hyponatremia *Management per nephrology service/TNA  Thrombocytopenia *See above  Normocytic Anemia  *Stable. Due to acute blood loss and due to chronic disease-see above regarding wound bleeding *Has received a total of 4 units of packed red blood cells this admission *Cont aranesp and ferric gluconate  HTN (hypertension) Fair control - no change in tx plan today  Chronic use of steroids Steroid being slowly tapered down  Malnutrition, calorie *Patient cachectic at presentation  *Parenteral nutrition managed by surgery and pharmacy  Leg dialysis graft occlusion *s/p thrombectomy 7/25 - unfortunately re clotted-has undergone an additional thrombectomy procedure with revision of arterial and graft on 04/27/2012 *Was being dialyzed via HD cath in right groin but now graft is being used again *See discussion per vascular surgery concerning narrowing at the venous anastomosis  DVT prophylaxis: SCDs Code Status: full  Disposition Plan: Remain in step down - consider transfer to medical/surg bed 05/13/2012  Consultants: General Surgery [facility] Infectious disease  Events since admission: 7/13 CT Abdomen with SBO  7/24 Ex-lap, lysis of adhesions, partial small bowel resection x2  7/25 Thrombectomy L femoral AV graft  7/26 Extubated, CVL is out  7/27 Transferred to telemetry under [facility]  8/2 Acute abdomen, dehiscence of anastomosis, SBO, back from OR intubated in ICU on PCCM service. Found to have moderate peritonitis infraumbilically 8/2 Extubated  Antibiotics: Zosyn 8/1 >>> Micafungin 8/3 >> Invanz 7/24-  periop Fluconazole- 8/2- one dose Vancomycin 8/8 >>> 05/01/2012  HPI/Subjective: More alert and interactive today.  No new complaints.  Objective: Filed Vitals:   05/12/12 0600 05/12/12 0700 05/12/12 0721 05/12/12 1201  BP: 144/73 126/66  141/66 145/79  Pulse: 72 73 78 67  Temp:   98.6 F (37 C) 98.8 F (37.1 C)  TempSrc:   Oral Oral  Resp: 18 14 12 13   Height:      Weight:      SpO2: 100% 100% 99% 100%    Intake/Output Summary (Last 24 hours) at 05/12/12 1721 Last data filed at 05/12/12 0800  Gross per 24 hour  Intake   1030 ml  Output    300 ml  Net    730 ml    Exam:   General:  Alert, appears calm, in no acute distress  Cardiovascular: RRR, sinus rhythm, has grade 2/6 systolic murmur left sternal border fourth intercostal space  Respiratory: CTA bilaterally - Normal respiratory effort.  Abdomen: Soft and only minimally tender in the right lower quadrant  Musculoskeletal: Extremities are symmetrical except for previously mentioned left lower extremity edema, no joint effusion or traumatic injuries, no peripheral cyanosis  Neurological: Patient is easily arousable, exam is nonfocal  Data Reviewed: Basic Metabolic Panel:  Lab 05/12/12 9604 05/11/12 0720 05/08/12 0830 05/07/12 0930 05/06/12 0809 05/06/12 0450  NA 134* 132* 126* 128* 124* --  K 3.2* 3.3* 3.3* 3.9 3.2* --  CL 96 95* 88* 91* 86* --  CO2 27 23 20 22 20  --  GLUCOSE 215* 108* 89 115* 116* --  BUN 45* 49* 77* 51* 78* --  CREATININE 3.61* 4.25* 5.63* 4.25* 5.69* --  CALCIUM 9.3 9.5 9.8 9.5 10.1 --  MG -- -- -- -- -- 1.6  PHOS -- 2.4 3.1 -- 3.6 3.6   Liver Function Tests:  Lab 05/11/12 0720 05/08/12 0830 05/06/12 0809  AST 20 24 --  ALT 18 23 --  ALKPHOS 105 118* --  BILITOT 1.7* 1.7* --  PROT 5.1* 5.5* --  ALBUMIN 1.4* 1.6* 1.7*   CBC:  Lab 05/11/12 0720 05/10/12 0530 05/10/12 0514 05/08/12 0830 05/07/12 0930 05/07/12 0645  WBC 13.5* -- 21.6* 13.9* 15.5* 13.9*  NEUTROABS -- 19.0* -- --  -- --  HGB 10.6* -- 11.0* 11.5* 11.8* 11.7*  HCT 31.3* -- 33.4* 34.6* 35.6* 36.2*  MCV 86.2 -- 84.6 85.0 88.1 87.2  PLT 108* -- 126* 156 136* 133*   CBG:  Lab 05/12/12 1644 05/12/12 1201 05/12/12 0819 05/12/12 0423 05/11/12 2333  GLUCAP 148* 113* 90 123* 95   Scheduled Meds: reviewed Continuous Infusions: reviewed _________________________________________________________________  05/12/2012, 5:21 PM  LOS: 40 days    Lonia Blood, MD [facility] Office  [phone] Pager [phone]  On-Call/Text Page:      [email or web address]      password [email or web address]

## 2012-05-12 NOTE — Progress Notes (Signed)
PARENTERAL NUTRITION/ABX CONSULT NOTE - FOLLOW UP  Pharmacy Consult:  TPN Indication: SBO with new EC fistula  Allergies  Allergen Reactions  . Allopurinol     REACTION: decreased platelets  . Aspirin     REACTION: unspecified   Patient Measurements: Height: 5\' 11"  (180.3 cm) Weight: 114 lb 13.8 oz (52.1 kg) IBW/kg (Calculated) : 75.3   Vital Signs: Temp: 98.6 F (37 C) (08/21 0721) Temp src: Oral (08/21 0721) BP: 141/66 mmHg (08/21 0721) Pulse Rate: 78  (08/21 0721) Intake/Output from previous day: 08/20 0701 - 08/21 0700 In: 2080 [IV Piggyback:350; TPN:1730] Out: 2796 [Emesis/NG output:300; Drains:60]  Labs:  [facility] 05/11/12 0720 05/10/12 0514  WBC 13.5* 21.6*  HGB 10.6* 11.0*  HCT 31.3* 33.4*  PLT 108* 126*  APTT -- --  INR -- --    Basename 05/07/12 0930 05/06/12 0809 05/06/12 0450  NA 128* 124* --  K 3.9 3.2* --  CL 91* 86* --  CO2 22 20 --  GLUCOSE 115* 116* --  BUN 51* 78* --  CREATININE 4.25* 5.69* --  CALCIUM 9.5 10.1 --  MG -- -- 1.6  PHOS -- 3.6 3.6  ALBUMIN -- 1.7* --   Estimated Creatinine Clearance: 17.6 ml/min (by C-G formula based on Cr of 3.61).   Basename 05/12/12 0819 05/12/12 0423 05/11/12 2333  GLUCAP 90 123* 95    Insulin Requirements in the past 12 hours:  2 units SSI + 20 units regular insulin in TNA.    Nutritional Goals:  1700-1950 kCal, 75-85 grams of protein per day  Current Nutrition:  Clinimix 5/15 at 70 ml/hr with 20% lipids at 10 ml/hr MWF provides an average of 1398 kcal and 84g protein per day  Admit:  Admitted 7/13 with abd pain, weight loss, SBO. S/p LOA, SBR 7/24 and repeat LOA, SBR with anasomotic leak repair 8/1, thrombectomy with AVG revision 7/25 and repeat thrombectomy 8/6.  CT on 8/13 reveals enlarging abscess and enteric fistula.   GI: NGT and wound drainage O/P trending down overall; tolerating TPN Endo:  CBGs fairly controlled with one elevated value, minimal use of SSI.  On moderate SSI q4 + 25  units insulin in TPN. Solu-Cortef daily. Lytes: low K+ / Na+, hypercalcemia (corrected Ca 11.38, Ca x PO4 < 55),  MD comfortable with K+ 3.2, will monitor. Renal:  ESRD with HD typically TTS - currently receiving HD d/t fluid overload with ongoing TPN.  HD is sporadic based on volume issues - received HD x 4 hrs on 8/19. On Aranesp qSat.  Pulm: extubated on 8/1; stable on RA Cards: BP mildly elevated (had hypotension during HD and unable to tolerate UF). HR mostly controlled. C/o CP on 8/14 unrelieved with NTG x3 doses, tropnin 1.76 -- thought to be related to demand ischemia. No further CP.  On scheduled lopressor and prn hydralazine. Hepatobil: Alk phos and tbili are elevated. Albumin is low. Prealbumin is at goal. TC / TG trending up. Neuro: GCS 15, no c/o pain - on fentanyl patch, prn pain meds. ID: Fluconazole + Zosyn for peritonitis/abd abscess -- ID following. Afebrile, WBC decreasing.   Abd abscess culture: candida tropicalis, last note says sens to azoles but not showing in cx report. Drain repositioned and removed 200 cc of brown fluid from abscess on 8/14 -- not sent for culturing.   Zosyn 8.1>> Diflucan 8.16>>  Best Practices:  Mouthcare, PPI-IV   Plan:  - Decrease Clinimix 5/15 back to 70 ml/hr to avoid exacerbating volume  status and patient to start PO's soon.  MD / RD aware TPN not meeting goals with Clinimix alone. - Decrease insulin to 20 units in TPN - Provide available trace elements, MVI and lipids @10cc /hr on MWF only d/t national backorder - Monitor plts, lytes, CBGs, volume status     Shiya Fogelman D. Laney Potash, PharmD, BCPS Pager:  [phone] 05/12/2012, 10:19 AM

## 2012-05-12 NOTE — Progress Notes (Signed)
Patient ID: [name], male   DOB: 1961/04/09, 51 y.o.   MRN: [id number] 15 Days Post-Op  Subjective: Abdominal pain continues to be better today, no new complaints.    Objective: Vital signs in last 24 hours: Temp:  [97.8 F (36.6 C)-99.8 F (37.7 C)] 98.4 F (36.9 C) (08/21 0350) Pulse Rate:  [72-120] 73  (08/21 0700) Resp:  [12-21] 14  (08/21 0700) BP: (87-160)/(55-88) 126/66 mmHg (08/21 0700) SpO2:  [97 %-100 %] 100 % (08/21 0700) Weight:  [114 lb 13.8 oz (52.1 kg)] 114 lb 13.8 oz (52.1 kg) (08/20 1135) Last BM Date:  (pt cannot recall)  Intake/Output from previous day: 08/20 0701 - 08/21 0700 In: 2080 [IV Piggyback:350; TPN:1730] Out: 2796 [Emesis/NG output:300; Drains:60] Intake/Output this shift:    General appearance: alert and no distress Nose: NGT in place Resp: clear to auscultation bilaterally Cardio: RRR GI: Eakins pouch with bilious output, soft, nontender, drain also with enteric output Extremities: no edema  Lab Results:   Basename 05/11/12 0720 05/10/12 0514  WBC 13.5* 21.6*  HGB 10.6* 11.0*  HCT 31.3* 33.4*  PLT 108* 126*   BMET  Basename 05/11/12 0720  NA 132*  K 3.3*  CL 95*  CO2 23  GLUCOSE 108*  BUN 49*  CREATININE 4.25*  CALCIUM 9.5   PT/INR No results found for this basename: LABPROT:2,INR:2 in the last 72 hours ABG No results found for this basename: PHART:2,PCO2:2,PO2:2,HCO3:2 in the last 72 hours  Studies/Results: Ir Shuntogram/ Fistulagram Left Mod Sed  05/10/2012  *RADIOLOGY REPORT*  Clinical Data: Left leg swelling.  Graft revision 2 weeks ago.  AV SHUNTOGRAM  Procedure:  The left thigh was prepped and draped in a sterile fashion.  An 18 gauge Angiocath was inserted into the AV graft. Contrast was injected.  The Angiocath was removed.  Hemostasis was achieved with direct pressure. No complication.  Findings: The arterial anastomosis and graft are widely patent. There is moderate narrowing at the venous anastomosis.  There is  associated narrowing of the external iliac vein.  Central venous structures are otherwise patent  IMPRESSION: Venous anastomotic and external iliac vein narrowing.  Otherwise patent circuit.   Original Report Authenticated By: Donavan Burnet, M.D. ( 05/10/2012 13:05:15 )     Anti-infectives: Anti-infectives     Start     Dose/Rate Route Frequency Ordered Stop   05/08/12 1200   fluconazole (DIFLUCAN) IVPB 400 mg        400 mg 200 mL/hr over 60 Minutes Intravenous Every T-Th-Sa (Hemodialysis) 05/07/12 1752     04/30/12 1400   vancomycin (VANCOCIN) 750 mg in sodium chloride 0.9 % 150 mL IVPB  Status:  Discontinued        750 mg 150 mL/hr over 60 Minutes Intravenous  Once 04/30/12 1212 04/30/12 1507   04/29/12 2330   vancomycin (VANCOCIN) 1,500 mg in sodium chloride 0.9 % 500 mL IVPB        1,500 mg 250 mL/hr over 120 Minutes Intravenous  Once 04/29/12 2259 04/30/12 0246   04/27/12 0000   ceFAZolin (ANCEF) IVPB 1 g/50 mL premix  Status:  Discontinued     Comments: Send with pt to OR      1 g 100 mL/hr over 30 Minutes Intravenous On call 04/26/12 0927 04/26/12 0928   04/24/12 1000   micafungin (MYCAMINE) 100 mg in sodium chloride 0.9 % 100 mL IVPB  Status:  Discontinued        100 mg 100 mL/hr  over 1 Hours Intravenous Daily 04/24/12 0750 05/07/12 1713   04/23/12 1600   fluconazole (DIFLUCAN) IVPB 400 mg        400 mg 200 mL/hr over 60 Minutes Intravenous  Once 04/23/12 1428 04/23/12 1800   04/22/12 2200   piperacillin-tazobactam (ZOSYN) IVPB 3.375 g  Status:  Discontinued        3.375 g 100 mL/hr over 30 Minutes Intravenous 3 times per day 04/22/12 2120 04/22/12 2137   04/22/12 2200   piperacillin-tazobactam (ZOSYN) IVPB 2.25 g        2.25 g 100 mL/hr over 30 Minutes Intravenous 3 times per day 04/22/12 2140     04/22/12 2157   gentamicin (GARAMYCIN) injection  Status:  Discontinued          As needed 04/22/12 2158 04/22/12 2335   04/22/12 2156   clindamycin (CLEOCIN) injection   Status:  Discontinued          As needed 04/22/12 2157 04/22/12 2335   04/22/12 2145   piperacillin-tazobactam (ZOSYN) IVPB 3.375 g        3.375 g 12.5 mL/hr over 240 Minutes Intravenous  Once 04/22/12 2141 04/23/12 0145   04/22/12 2130   gentamicin (GARAMYCIN) 240 mg, clindamycin (CLEOCIN) 900 mg in sodium chloride irrigation 0.9 % 1,000 mL irrigation  Status:  Discontinued         Irrigation  Once 04/22/12 2119 04/23/12 0020   04/14/12 0830   ertapenem (INVANZ) 1 g in sodium chloride 0.9 % 50 mL IVPB        1 g 100 mL/hr over 30 Minutes Intravenous To Surgery 04/14/12 0816 04/14/12 0834   04/13/12 1359   ertapenem (INVANZ) 1 g in sodium chloride 0.9 % 50 mL IVPB  Status:  Discontinued        1 g 100 mL/hr over 30 Minutes Intravenous 60 min pre-op 04/13/12 1359 04/14/12 0816          Assessment/Plan: s/p Procedure(s) (LRB): THROMBECTOMY ARTERIOVENOUS GORE-TEX GRAFT (Left) REVISION OF ARTERIOVENOUS GORETEX GRAFT (Left) S/P SBR with EC fistula - continue Eakins pouch and IR drain. Pain continues to improve. Further surgery not an option at this time according to my partners that did his last surgery.  Hopefully with adequate drainage this will close. Will clamp NGT and give sips of water and ice chips.  If tolerates then d/c NGT later today. ID - Zosyn FEN - TNA ESRD - per renal, staples in groin per vascular.  LOS: 40 days    Eleno Weimar 05/12/2012

## 2012-05-12 NOTE — Progress Notes (Signed)
Subjective: Interval History: none.  Objective: Vital signs in last 24 hours: Temp:  [97.8 F (36.6 C)-99.8 F (37.7 C)] 98.6 F (37 C) (08/21 0721) Pulse Rate:  [72-120] 78  (08/21 0721) Resp:  [12-21] 12  (08/21 0721) BP: (87-160)/(55-88) 141/66 mmHg (08/21 0721) SpO2:  [97 %-100 %] 99 % (08/21 0721) Weight:  [52.1 kg (114 lb 13.8 oz)] 52.1 kg (114 lb 13.8 oz) (08/20 1135) Weight change: -4.1 kg (-9 lb 0.6 oz)  Intake/Output from previous day: 08/20 0701 - 08/21 0700 In: 2080 [IV Piggyback:350; TPN:1730] Out: 2796 [Emesis/NG output:300; Drains:60] Intake/Output this shift:    General appearance: alert, cooperative and cachectic Resp: clear to auscultation bilaterally Breasts: normal appearance, no masses or tenderness GI: incision/wound lower abdm.  bs present but decreased, ostomy upper mid abdm Extremities: avg L groin, B&T CV no edema, GR 2/6 M Lab Results:  Basename 05/11/12 0720 05/10/12 0514  WBC 13.5* 21.6*  HGB 10.6* 11.0*  HCT 31.3* 33.4*  PLT 108* 126*   BMET:  Basename 05/11/12 0720  NA 132*  K 3.3*  CL 95*  CO2 23  GLUCOSE 108*  BUN 49*  CREATININE 4.25*  CALCIUM 9.5   No results found for this basename: PTH:2 in the last 72 hours Iron Studies: No results found for this basename: IRON,TIBC,TRANSFERRIN,FERRITIN in the last 72 hours  Studies/Results: Ir Shuntogram/ Fistulagram Left Mod Sed  05/10/2012  *RADIOLOGY REPORT*  Clinical Data: Left leg swelling.  Graft revision 2 weeks ago.  AV SHUNTOGRAM  Procedure:  The left thigh was prepped and draped in a sterile fashion.  An 18 gauge Angiocath was inserted into the AV graft. Contrast was injected.  The Angiocath was removed.  Hemostasis was achieved with direct pressure. No complication.  Findings: The arterial anastomosis and graft are widely patent. There is moderate narrowing at the venous anastomosis.  There is associated narrowing of the external iliac vein.  Central venous structures are otherwise  patent  IMPRESSION: Venous anastomotic and external iliac vein narrowing.  Otherwise patent circuit.   Original Report Authenticated By: Donavan Burnet, M.D. ( 05/10/2012 13:05:15 )     I have reviewed the patient's current medications.  Assessment/Plan: 1 ESRD vol ok, follow closely 2 ^ Ca lower in bath 3 Malnutition TNA ? Start pos 4 S/P SBO dehisc, abscess on AB drain, ? Re CT 5 Anemia stable 6 GOUT   P HD,  TNA, AB epo    LOS: 40 days   Todd Argabright L 05/12/2012,8:15 AM

## 2012-05-12 NOTE — Progress Notes (Signed)
Clamp ngt Patient examined and I agree with the assessment and plan  Violeta Gelinas, MD, MPH, FACS Pager: [phone]  05/12/2012 2:26 PM

## 2012-05-12 NOTE — Progress Notes (Signed)
15 Days Post-Op  Subjective: RLQ drain placed 8/9; up sized 8/14 Output has been bilious - looks bloody today Pt resting - no complaints  Objective: Vital signs in last 24 hours: Temp:  [98.4 F (36.9 C)-99.8 F (37.7 C)] 98.8 F (37.1 C) (08/21 1201) Pulse Rate:  [67-90] 67  (08/21 1201) Resp:  [12-21] 13  (08/21 1201) BP: (109-160)/(60-88) 145/79 mmHg (08/21 1201) SpO2:  [97 %-100 %] 100 % (08/21 1201) Last BM Date:  (pt is unsure of last b,)  Intake/Output from previous day: 08/20 0701 - 08/21 0700 In: 2080 [IV Piggyback:350; TPN:1730] Out: 2796 [Emesis/NG output:300; Drains:60] Intake/Output this shift: Total I/O In: -  Out: 90 [Drains:90]  PE:  Frail; weak No pain Site of RLQ drain intact; no sign of infection Output minimal x 2 days Bloody appearing today  Lab Results:   [facility] 05/11/12 0720 05/10/12 0514  WBC 13.5* 21.6*  HGB 10.6* 11.0*  HCT 31.3* 33.4*  PLT 108* 126*   BMET  Basename 05/12/12 0739 05/11/12 0720  NA 134* 132*  K 3.2* 3.3*  CL 96 95*  CO2 27 23  GLUCOSE 215* 108*  BUN 45* 49*  CREATININE 3.61* 4.25*  CALCIUM 9.3 9.5   PT/INR No results found for this basename: LABPROT:2,INR:2 in the last 72 hours ABG No results found for this basename: PHART:2,PCO2:2,PO2:2,HCO3:2 in the last 72 hours  Studies/Results: Ir Shuntogram/ Fistulagram Left Mod Sed  05/10/2012  *RADIOLOGY REPORT*  Clinical Data: Left leg swelling.  Graft revision 2 weeks ago.  AV SHUNTOGRAM  Procedure:  The left thigh was prepped and draped in a sterile fashion.  An 18 gauge Angiocath was inserted into the AV graft. Contrast was injected.  The Angiocath was removed.  Hemostasis was achieved with direct pressure. No complication.  Findings: The arterial anastomosis and graft are widely patent. There is moderate narrowing at the venous anastomosis.  There is associated narrowing of the external iliac vein.  Central venous structures are otherwise patent  IMPRESSION: Venous  anastomotic and external iliac vein narrowing.  Otherwise patent circuit.   Original Report Authenticated By: [name], M.D. ( 05/10/2012 13:05:15 )     Anti-infectives:   Assessment/Plan: s/p Procedure(s) (LRB): THROMBECTOMY ARTERIOVENOUS GORE-TEX GRAFT (Left) REVISION OF ARTERIOVENOUS GORETEX GRAFT (Left)  RLQ abscess drain in place No complaints followed by CCS Rec: CT soon to evaluate RLQ collection  [name] A 05/12/2012

## 2012-05-12 NOTE — Consult Note (Signed)
Wound care follow-up:  Eakin pouch intact to abd wound.  Mod blood-tinged drainage in pouch.  Supplies at bedside for staff use.  Cammie Mcgee, RN, MSN, [employer]  [phone]

## 2012-05-12 NOTE — Progress Notes (Signed)
Nutrition Follow-up/Consult  Intervention:   1. Continue TPN per pharmacy 2. Recommend trophic enteral feedings once return of bowel function - will defer to surgery 3. RD to continue to follow nutrition care plan   Assessment:   Pt's TPN increased yesterday. Patient is now receiving TPN with Clinimix 5/15 @ 83 ml/hr. Lipids (20% IVFE @ 10 ml/hr), multivitamins, and trace elements are provided 3 times weekly (MWF) due to national backorder. Provides 1620 kcal and 100 grams protein daily (based on weekly average). Meets 95% minimum estimated kcal and 100% estimated protein needs.  This RD discussed TPN regimen with pharmacist.  Planning to clamp NGT and give sips of water/ice chips today. If tolerates, then d/c NGT later today, per surgery note.  Diet Order:  NPO  Meds: Scheduled Meds:    . antiseptic oral rinse  15 mL Mouth Rinse q12n4p  . chlorhexidine  15 mL Mouth Rinse BID  . darbepoetin (ARANESP) injection - DIALYSIS  200 mcg Intravenous Q Sat-HD  . fentaNYL  37.5 mcg Transdermal Q72H  . fluconazole (DIFLUCAN) IV  400 mg Intravenous Q T,Th,Sa-HD  . heparin  40 Units/kg Dialysis Once in dialysis  . hydrocortisone sod succinate (SOLU-CORTEF) injection  25 mg Intravenous Daily  . insulin aspart  0-15 Units Subcutaneous Q4H  . lip balm   Topical BID  . metoprolol  5 mg Intravenous Q6H  . pantoprazole (PROTONIX) IV  40 mg Intravenous Q24H  . piperacillin-tazobactam (ZOSYN)  IV  2.25 g Intravenous Q8H  . sodium chloride  10-40 mL Intracatheter Q12H   Continuous Infusions:    . fat emulsion 250 mL (05/10/12 1736)  . TPN (CLINIMIX) +/- additives 70 mL/hr at 05/10/12 1736  . TPN (CLINIMIX) +/- additives 83 mL/hr at 05/11/12 1759   PRN Meds:.acetaminophen, acetaminophen, albuterol, feeding supplement (NEPRO CARB STEADY), heparin, hydrALAZINE, lidocaine, lidocaine-prilocaine, morphine injection, nitroGLYCERIN, ondansetron (ZOFRAN) IV, ondansetron, pentafluoroprop-tetrafluoroeth,  promethazine, sodium chloride, DISCONTD: sodium chloride, DISCONTD: sodium chloride, DISCONTD: alteplase, DISCONTD: feeding supplement (NEPRO CARB STEADY), DISCONTD: heparin, DISCONTD: lidocaine DISCONTD: lidocaine-prilocaine, DISCONTD: pentafluoroprop-tetrafluoroeth  Labs:  CMP     Component Value Date/Time   NA 132* 05/11/2012 0720   K 3.3* 05/11/2012 0720   CL 95* 05/11/2012 0720   CO2 23 05/11/2012 0720   GLUCOSE 108* 05/11/2012 0720   BUN 49* 05/11/2012 0720   CREATININE 4.25* 05/11/2012 0720   CALCIUM 9.5 05/11/2012 0720   PROT 5.1* 05/11/2012 0720   ALBUMIN 1.4* 05/11/2012 0720   AST 20 05/11/2012 0720   ALT 18 05/11/2012 0720   ALKPHOS 105 05/11/2012 0720   BILITOT 1.7* 05/11/2012 0720   GFRNONAA 15* 05/11/2012 0720   GFRAA 17* 05/11/2012 0720   Phosphorus  Date/Time Value Range Status  05/11/2012  7:20 AM 2.4  2.3 - 4.6 mg/dL Final  1/61/0960  4:54 AM 3.1  2.3 - 4.6 mg/dL Final  0/98/1191  4:78 AM 3.6  2.3 - 4.6 mg/dL Final   Potassium  Date/Time Value Range Status  05/11/2012  7:20 AM 3.3* 3.5 - 5.1 mEq/L Final  05/08/2012  8:30 AM 3.3* 3.5 - 5.1 mEq/L Final  05/07/2012  9:30 AM 3.9  3.5 - 5.1 mEq/L Final   Magnesium  Date/Time Value Range Status  05/06/2012  4:50 AM 1.6  1.5 - 2.5 mg/dL Final  2/95/6213  0:86 AM 1.7  1.5 - 2.5 mg/dL Final  01/27/8468  6:29 AM 1.6  1.5 - 2.5 mg/dL Final   Prealbumin  Date/Time Value Range Status  05/10/2012  5:30 AM 26.6  17.0 - 34.0 mg/dL Final  9/60/4540  9:81 AM 25.1  17.0 - 34.0 mg/dL Final  09/30/1476  2:95 AM 7.7* 17.0 - 34.0 mg/dL Final    Intake/Output Summary (Last 24 hours) at 05/12/12 0842 Last data filed at 05/12/12 0800  Gross per 24 hour  Intake   2000 ml  Output   2886 ml  Net   -886 ml  BM on 8/9  Weight Status: 52.1 kg s/p HD on 8/20 - trending down 54 kg s/p HD on 8/17 63.8 kg s/p HD on 8/12 65.7 kg s/p HD on 8/8  50.5 kg s/p HD on 8/6 50 kg s/p HD on 8/5 47.8 kg s/p HD on 7/30  48.3 kg s/p HD on 7/27  Body mass  index is 16.02 kg/(m^2). Underweight.  Estimated needs:  [calculated using the Kidney Disease Outcomes Quality Initiative (KDOQI) Adjustment Equation for the Underweight Patient]: 1700 - 1950 kcal, 80 - 100 grams protein daily  Nutrition Dx:  Inadequate oral intake now R/T GI distress AEB pt report. Ongoing.  Goal:  Initiation of TPN; intake to meet at least 90% of estimated needs. Met.  Monitor:  Diet advancement, TPN adequacy, weights, labs, I/O's  Jarold Motto MS, RD, LDN Pager: [phone] After-hours pager: [phone]

## 2012-05-13 ENCOUNTER — Inpatient Hospital Stay (HOSPITAL_COMMUNITY): Payer: Medicare Other

## 2012-05-13 DIAGNOSIS — R58 Hemorrhage, not elsewhere classified: Secondary | ICD-10-CM | POA: Diagnosis not present

## 2012-05-13 DIAGNOSIS — D631 Anemia in chronic kidney disease: Secondary | ICD-10-CM | POA: Diagnosis present

## 2012-05-13 LAB — GLUCOSE, CAPILLARY
Glucose-Capillary: 115 mg/dL — ABNORMAL HIGH (ref 70–99)
Glucose-Capillary: 106 mg/dL — ABNORMAL HIGH (ref 70–99)
Glucose-Capillary: 126 mg/dL — ABNORMAL HIGH (ref 70–99)
Glucose-Capillary: 107 mg/dL — ABNORMAL HIGH (ref 70–99)

## 2012-05-13 LAB — MAGNESIUM: Magnesium: 1.5 mg/dL (ref 1.5–2.5)

## 2012-05-13 LAB — CBC
Hemoglobin: 9.6 g/dL — ABNORMAL LOW (ref 13.0–17.0)
RBC: 3.47 MIL/uL — ABNORMAL LOW (ref 4.22–5.81)

## 2012-05-13 LAB — PROTIME-INR: Prothrombin Time: 15.1 seconds (ref 11.6–15.2)

## 2012-05-13 LAB — PHOSPHORUS: Phosphorus: 2.7 mg/dL (ref 2.3–4.6)

## 2012-05-13 MED ORDER — INSULIN REGULAR HUMAN 100 UNIT/ML IJ SOLN
INTRAVENOUS | Status: DC
  Administered 2012-05-13: 17:00:00 via INTRAVENOUS
  Filled 2012-05-13: qty 2000

## 2012-05-13 MED ORDER — MORPHINE SULFATE 2 MG/ML IJ SOLN
INTRAMUSCULAR | Status: AC
  Administered 2012-05-13: 2 mg via INTRAVENOUS
  Filled 2012-05-13: qty 1

## 2012-05-13 NOTE — Procedures (Signed)
I was present at this session.  I have reviewed the session itself and made appropriate changes.  Krissy Orebaugh L 8/22/20138:28 AM

## 2012-05-13 NOTE — Progress Notes (Signed)
ANTIBIOTIC CONSULT NOTE - FOLLOW UP  Pharmacy Consult for Fluconazole and Zosyn Indication: abdominal abscess with fistula  Allergies  Allergen Reactions  . Allopurinol     REACTION: decreased platelets  . Aspirin     REACTION: unspecified    Patient Measurements: Height: 5\' 11"  (180.3 cm) Weight: 120 lb 13 oz (54.8 kg) IBW/kg (Calculated) : 75.3   Vital Signs: Temp: 97.1 F (36.2 C) (08/22 0812) Temp src: Oral (08/22 0754) BP: 145/83 mmHg (08/22 0900) Pulse Rate: 85  (08/22 0900) Intake/Output from previous day: 08/21 0701 - 08/22 0700 In: 2056.7 [IV Piggyback:150; TPN:1906.7] Out: 185 [Drains:185]  Labs:  [facility] 05/13/12 0817 05/12/12 0739 05/11/12 0720  WBC 10.3 -- 13.5*  HGB 9.6* -- 10.6*  PLT 99* -- 108*  LABCREA -- -- --  CREATININE -- 3.61* 4.25*   Assessment:   Pt with abd abscess that has been on zosyn and now fluconazole for a while. Has drain in place. WBC finally down to normal limit. ID recommended abx for another 4 wks.      8/1 Zosyn>>   8/3 Micafungin >> 8/16   8/2 Fluconazole 400 mg IV x 1 >>resume 8/17  Goal of Therapy:    Appropriate doses for renal function and infection  Plan:    Fluconazole 400 mg IV after each hemodialysis, Tuesday-Thursday-Saturday   Continue Zosyn 2.25 grams IV q8 hrs.

## 2012-05-13 NOTE — Progress Notes (Signed)
Subjective: Interval History: none.  Objective: Vital signs in last 24 hours: Temp:  [97.1 F (36.2 C)-98.9 F (37.2 C)] 97.1 F (36.2 C) (08/22 0812) Pulse Rate:  [67-102] 73  (08/22 0812) Resp:  [11-19] 14  (08/22 0812) BP: (145-189)/(51-86) 165/51 mmHg (08/22 0812) SpO2:  [99 %-100 %] 100 % (08/22 0754) Weight:  [54.8 kg (120 lb 13 oz)-68 kg (149 lb 14.6 oz)] 54.8 kg (120 lb 13 oz) (08/22 0812) Weight change: 15.9 kg (35 lb 0.9 oz)  Intake/Output from previous day: 08/21 0701 - 08/22 0700 In: 1976.7 [IV Piggyback:150; TPN:1826.7] Out: 185 [Drains:185] Intake/Output this shift:    General appearance: alert, cooperative and cachectic Resp: rales bibasilar Cardio: S1, S2 normal and systolic murmur: systolic ejection 2/6, decrescendo at 2nd left intercostal space GI: few bs, soft, wound midabdm with ostomy bag.   Extremities: avg L groin  Lab Results:  Basename 05/11/12 0720  WBC 13.5*  HGB 10.6*  HCT 31.3*  PLT 108*   BMET:  Basename 05/12/12 0739 05/11/12 0720  NA 134* 132*  K 3.2* 3.3*  CL 96 95*  CO2 27 23  GLUCOSE 215* 108*  BUN 45* 49*  CREATININE 3.61* 4.25*  CALCIUM 9.3 9.5   No results found for this basename: PTH:2 in the last 72 hours Iron Studies: No results found for this basename: IRON,TIBC,TRANSFERRIN,FERRITIN in the last 72 hours  Studies/Results: No results found.  I have reviewed the patient's current medications.  Assessment/Plan: 1 ESRD for HD, try to take off vol given the past 2 days.  Getting po now.  4 K bath. 2 Malnutrition on TNA 3 Anemia stable 4 GOUT 5 HPTH 6 SBO/dehisc/abscess on AB, drain, ? reCT  P HD epo, TNA    LOS: 41 days   Talicia Sui L 05/13/2012,8:31 AM

## 2012-05-13 NOTE — Progress Notes (Signed)
PARENTERAL NUTRITION CONSULT NOTE - FOLLOW UP  Pharmacy Consult for TPN Indication: SBO with new EC fistula  Allergies  Allergen Reactions  . Allopurinol     REACTION: decreased platelets  . Aspirin     REACTION: unspecified    Patient Measurements: Height: 5\' 11"  (180.3 cm) Weight: 120 lb 13 oz (54.8 kg) IBW/kg (Calculated) : 75.3    Vital Signs: Temp: 97.1 F (36.2 C) (08/22 0812) Temp src: Oral (08/22 0754) BP: 147/79 mmHg (08/22 1030) Pulse Rate: 91  (08/22 1030) Intake/Output from previous day: 08/21 0701 - 08/22 0700 In: 2056.7 [IV Piggyback:150; TPN:1906.7] Out: 185 [Drains:185] Intake/Output from this shift: Total I/O In: 80 [TPN:80] Out: -   Labs:  [facility] 05/13/12 0817 05/11/12 0720  WBC 10.3 13.5*  HGB 9.6* 10.6*  HCT 29.0* 31.3*  PLT 99* 108*  APTT -- --  INR -- --     Basename 05/13/12 0845 05/12/12 0739 05/11/12 0720  NA -- 134* 132*  K -- 3.2* 3.3*  CL -- 96 95*  CO2 -- 27 23  GLUCOSE -- 215* 108*  BUN -- 45* 49*  CREATININE -- 3.61* 4.25*  LABCREA -- -- --  CREAT24HRUR -- -- --  CALCIUM -- 9.3 9.5  MG 1.5 -- --  PHOS 2.7 -- 2.4  PROT -- -- 5.1*  ALBUMIN -- -- 1.4*  AST -- -- 20  ALT -- -- 18  ALKPHOS -- -- 105  BILITOT -- -- 1.7*  BILIDIR -- -- --  IBILI -- -- --  PREALBUMIN -- -- --  TRIG -- -- --  CHOLHDL -- -- --  CHOL -- -- --   Estimated Creatinine Clearance: 18.6 ml/min (by C-G formula based on Cr of 3.61).    Basename 05/13/12 0753 05/13/12 0327 05/13/12 0140  GLUCAP 115* 117* 160*   Insulin Requirements in the past 24 hours:  7 units SSI + 20 units regular insulin in TNA.   Nutritional Goals:  1700-1950 kCal, 75-85 grams of protein per day   Current Nutrition:  Clinimix 5/15 at 70 ml/hr with 20% lipids at 10 ml/hr MWF provides an average of 1398 kcal and 84g protein per day  Admit: Admitted 7/13 with abd pain, weight loss, SBO. S/p LOA, SBR 7/24 and repeat LOA, SBR with anasomotic leak repair 8/1,  thrombectomy with AVG revision 7/25 and repeat thrombectomy 8/6. CT on 8/13 reveals enlarging abscess and enteric fistula.  GI: NGT and wound drainage O/P trending down overall; tolerating TPN  Endo: CBGs fairly controlled with one elevated value, minimal use of SSI. On moderate SSI q4 + 20 units insulin in TPN. Solu-Cortef daily.  Lytes: Mg at low end nml, Phos ok. No other new labs.Hypercalcemia (corrected Ca 11.38, Ca x PO4 < 55), MD comfortable with K+ 3.2, will monitor.  Renal: ESRD with HD typically TTS. HD is currently being administered prn d/t volume issues - received HD 8/19,20,22. On Aranesp qSat.  Pulm: extubated on 8/1; stable on RA  Cards: BP mildly elevated (had hypotension during HD and unable to tolerate UF). HR mostly controlled. C/o CP on 8/14 unrelieved with NTG x3 doses, troponin 1.76 -- thought to be related to demand ischemia. No further CP. On scheduled lopressor and prn hydralazine.  Hepatobil: Alk phos and tbili are elevated. Albumin is low. Prealbumin is at goal. TC / TG trending up, but WNL.  Neuro: GCS 15, no c/o pain - on fentanyl patch, prn pain meds.  ID: Fluconazole + Zosyn for peritonitis/abd  abscess -- ID following. Afebrile, WBC decreasing. Abd abscess culture: candida tropicalis, last note says sens to azoles but not showing in cx report. Drain repositioned and removed 200 cc of brown fluid from abscess on 8/14 -- not sent for culturing.  Zosyn 8.1>>  Diflucan 8.16>> (s/p 14 d mycamine) Best Practices: Mouthcare, PPI-IV   Plan:  - Continue Clinimix 5/15 @ 70 ml/hr to avoid exacerbating volume status and patient to start PO's soon hopefully. MD / RD aware TPN not meeting goals with Clinimix alone.  - Provide available trace elements, MVI and lipids @10cc /hr on MWF only d/t national backorder  - Will hold off on checking further labs for now as we are deferring to renal MD for repletion.  Shivan Hodes K. Allena Katz, PharmD, BCPS.  Clinical Pharmacist Pager  [phone]. 05/13/2012 11:01 AM

## 2012-05-13 NOTE — Progress Notes (Signed)
Pain better and doing OK with NGT out This will be difficult for him to heal but fistula controlled and will continue TNA. Patient examined and I agree with the assessment and plan  Violeta Gelinas, MD, MPH, FACS Pager: [phone]  05/13/2012 9:27 AM

## 2012-05-13 NOTE — Progress Notes (Signed)
Patient ID: [name], male   DOB: 1961/07/18, 51 y.o.   MRN: [id number] Patient ID: [name], male   DOB: 1961-09-20, 69 y.o.   MRN: [id number] 16 Days Post-Op  Subjective: No abdominal pain this morning, no new complaints.    Objective: Vital signs in last 24 hours: Temp:  [97.6 F (36.4 C)-98.9 F (37.2 C)] 98.1 F (36.7 C) (08/22 0300) Pulse Rate:  [67-102] 71  (08/22 0600) Resp:  [11-19] 11  (08/22 0600) BP: (145-189)/(64-86) 155/68 mmHg (08/22 0600) SpO2:  [99 %-100 %] 100 % (08/22 0600) Weight:  [149 lb 14.6 oz (68 kg)] 149 lb 14.6 oz (68 kg) (08/22 0300) Last BM Date:  (pt is unsure of last b,)  Intake/Output from previous day: 08/21 0701 - 08/22 0700 In: 1976.7 [IV Piggyback:150; TPN:1826.7] Out: 185 [Drains:185] Intake/Output this shift:    General appearance: alert and no distress Nose: NGT in place Resp: clear to auscultation bilaterally Cardio: RRR GI: Eakins pouch with bilious output, soft, nontender, drain also with enteric output Extremities: no edema  Lab Results:   Basename 05/11/12 0720  WBC 13.5*  HGB 10.6*  HCT 31.3*  PLT 108*   BMET  Basename 05/12/12 0739 05/11/12 0720  NA 134* 132*  K 3.2* 3.3*  CL 96 95*  CO2 27 23  GLUCOSE 215* 108*  BUN 45* 49*  CREATININE 3.61* 4.25*  CALCIUM 9.3 9.5   PT/INR No results found for this basename: LABPROT:2,INR:2 in the last 72 hours ABG No results found for this basename: PHART:2,PCO2:2,PO2:2,HCO3:2 in the last 72 hours  Studies/Results: No results found.  Anti-infectives: Anti-infectives     Start     Dose/Rate Route Frequency Ordered Stop   05/08/12 1200   fluconazole (DIFLUCAN) IVPB 400 mg        400 mg 200 mL/hr over 60 Minutes Intravenous Every T-Th-Sa (Hemodialysis) 05/07/12 1752     04/30/12 1400   vancomycin (VANCOCIN) 750 mg in sodium chloride 0.9 % 150 mL IVPB  Status:  Discontinued        750 mg 150 mL/hr over 60 Minutes Intravenous  Once 04/30/12 1212 04/30/12 1507   04/29/12 2330   vancomycin (VANCOCIN) 1,500 mg in sodium chloride 0.9 % 500 mL IVPB        1,500 mg 250 mL/hr over 120 Minutes Intravenous  Once 04/29/12 2259 04/30/12 0246   04/27/12 0000   ceFAZolin (ANCEF) IVPB 1 g/50 mL premix  Status:  Discontinued     Comments: Send with pt to OR      1 g 100 mL/hr over 30 Minutes Intravenous On call 04/26/12 0927 04/26/12 0928   04/24/12 1000   micafungin (MYCAMINE) 100 mg in sodium chloride 0.9 % 100 mL IVPB  Status:  Discontinued        100 mg 100 mL/hr over 1 Hours Intravenous Daily 04/24/12 0750 05/07/12 1713   04/23/12 1600   fluconazole (DIFLUCAN) IVPB 400 mg        400 mg 200 mL/hr over 60 Minutes Intravenous  Once 04/23/12 1428 04/23/12 1800   04/22/12 2200   piperacillin-tazobactam (ZOSYN) IVPB 3.375 g  Status:  Discontinued        3.375 g 100 mL/hr over 30 Minutes Intravenous 3 times per day 04/22/12 2120 04/22/12 2137   04/22/12 2200   piperacillin-tazobactam (ZOSYN) IVPB 2.25 g        2.25 g 100 mL/hr over 30 Minutes Intravenous 3 times per day 04/22/12 2140  04/22/12 2157   gentamicin (GARAMYCIN) injection  Status:  Discontinued          As needed 04/22/12 2158 04/22/12 2335   04/22/12 2156   clindamycin (CLEOCIN) injection  Status:  Discontinued          As needed 04/22/12 2157 04/22/12 2335   04/22/12 2145   piperacillin-tazobactam (ZOSYN) IVPB 3.375 g        3.375 g 12.5 mL/hr over 240 Minutes Intravenous  Once 04/22/12 2141 04/23/12 0145   04/22/12 2130   gentamicin (GARAMYCIN) 240 mg, clindamycin (CLEOCIN) 900 mg in sodium chloride irrigation 0.9 % 1,000 mL irrigation  Status:  Discontinued         Irrigation  Once 04/22/12 2119 04/23/12 0020   04/14/12 0830   ertapenem (INVANZ) 1 g in sodium chloride 0.9 % 50 mL IVPB        1 g 100 mL/hr over 30 Minutes Intravenous To Surgery 04/14/12 0816 04/14/12 0834   04/13/12 1359   ertapenem (INVANZ) 1 g in sodium chloride 0.9 % 50 mL IVPB  Status:  Discontinued        1  g 100 mL/hr over 30 Minutes Intravenous 60 min pre-op 04/13/12 1359 04/14/12 0816          Assessment/Plan: s/p Procedure(s) (LRB): THROMBECTOMY ARTERIOVENOUS GORE-TEX GRAFT (Left) REVISION OF ARTERIOVENOUS GORETEX GRAFT (Left)  S/P SBR with EC fistula - continue Eakins pouch and IR drain.  Further surgery not an option at this time. DC NGT and give sips of water and ice chips.  ID - Zosyn FEN - TNA ESRD - per renal, staples in groin per vascular.  LOS: 41 days    Gwenivere Hiraldo 05/13/2012

## 2012-05-13 NOTE — Consult Note (Signed)
Wound care follow-up:  Eakin pouch intact with good seal.  Remains with mod red-brown drainage.  Supplies at bedside for staff use if leakage occurs.  Plan to apply new pouch on Friday.  Cammie Mcgee, RN, MSN, [employer]  [phone]

## 2012-05-13 NOTE — Progress Notes (Signed)
[facility] PROGRESS NOTE  STEFON RAMTHUN AVW:[id number] DOB: [birth year] DOA: 04/02/2012 PCP: Trevor Iha, MD  Assessment/Plan:  Acute blood loss From open abdominal wound- trend hemoglobin. Recheck coags (Vit K deficiency?)  and reverse if needed.   SBO (small bowel obstruction) s/p lysis of adhesions and partial small bowel resection 7/24 *Subsequent anastomotic leak- omental patch of anastomosis 8/2  *CT abdomen and pelvis 04/29/2012 demonstrated several fluid collections consistent with intra-abdominal abscess with the largest measuring 16.5 x 13.7 x 6.4 cm.  *Patient underwent percutaneous drainage of largest fluid collection 04/30/2012 *Repeat CT done for increasing WBC count on 8/13 revealed enlarging abscess and enteric fistula and obstructed drainage tube. 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 *An attempt was made on 04/21/2012 to place a central line in interventional radiology under fluoroscopy but unfortunately central venous access could not be placed due to upper extremity and internal jugular vein access being chronically occluded bilaterally therefore rationale for why we have to continue to use this patient's right femoral Vas-Cath port for parenteral nutrition  Chronic nonocclusive left lower extremity DVT Diagnosed via Doppler 04/30/2012 - he continues with left lower extermity edema- f/u doppler is negative  Ileus following gastrointestinal  surgery *Cont TNA via right femoral Vas-Cath port per surgery  Atypical chest pain/abnormal cardiac enzyme *Having episodes of severe chest pain during this hospitalization. CT of the chest revealed no evidence of pulmonary embolism or aortic dissection *Suspect mildly elevated cardiac isoenzymes related to demand ischemia versus recent stressors of recurrent surgical procedures  CKD (chronic kidney disease) stage V requiring chronic dialysis *Dialysis per nephrology  Hypokalemia/hypophosphatemia/hyponatremia *Management per nephrology service/TNA  Thrombocytopenia *See above  Normocytic Anemia  *Stable. Due to acute blood loss and due to chronic disease-see above regarding wound bleeding *Has received a total of 4 units of packed red blood cells this admission *Cont aranesp and ferric gluconate  HTN (hypertension) Fair control - no change in tx plan today  Chronic use of steroids Steroid being slowly tapered down  Malnutrition, calorie *Patient cachectic at presentation  *Parenteral nutrition managed by surgery and pharmacy  Leg dialysis graft occlusion *s/p thrombectomy 7/25 - unfortunately re clotted-has undergone an additional thrombectomy procedure with revision of arterial and graft on 04/27/2012 *Was being dialyzed via HD cath in right groin but now graft is being used again *See discussion per vascular surgery concerning narrowing at the venous anastomosis  DVT prophylaxis: SCDs Code Status: full  Disposition Plan: Remain in step down due to ongoing bleed from wound  Consultants: General Surgery [facility] Infectious disease  Events since admission: 7/13 CT Abdomen with SBO  7/24 Ex-lap, lysis of adhesions, partial small bowel resection x2  7/25 Thrombectomy L femoral AV graft  7/26 Extubated, CVL is out  7/27 Transferred to telemetry under [facility]  8/2 Acute abdomen, dehiscence of anastomosis, SBO, back from OR intubated in ICU on PCCM service. Found to  have moderate peritonitis infraumbilically 8/2 Extubated  Antibiotics: Zosyn 8/1 >>> Micafungin 8/3 >>8/16 Invanz 7/24- periop Fluconazole- 8/2- one dose- 8/20 >> Vancomycin 8/8 >>> 05/01/2012  HPI/Subjective: Appear  quite positive today, smiling. Pleased that his left leg swelling is down and NG tube has been removed. No nausea.   Objective: Filed Vitals:   05/13/12 1236 05/13/12 1320 05/13/12 1400 05/13/12 1600  BP: 143/82 143/67    Pulse: 91 91 80 75  Temp: 97.1 F (36.2 C) 97.5 F (36.4 C)  98.7 F (37.1 C)  TempSrc: Oral Oral  Oral  Resp: 16 18 17 19   Height:      Weight: 51.4 kg (113 lb 5.1 oz)     SpO2: 100% 100% 100% 99%    Intake/Output Summary (Last 24 hours) at 05/13/12 1750 Last data filed at 05/13/12 1700  Gross per 24 hour  Intake 3016.68 ml  Output   3395 ml  Net -378.32 ml    Exam:   General:  Alert, appears calm, in no acute distress  Cardiovascular: RRR, sinus rhythm, has grade 2/6 systolic murmur left sternal border fourth intercostal space  Respiratory: CTA bilaterally - Normal respiratory effort.  Abdomen: Soft and only minimally tender in the right lower quadrant  Musculoskeletal: Extremities are symmetrical except for mildleft lower extremity edema, no joint effusion or traumatic injuries, no peripheral cyanosis  Neurological: Patient is alert, exam is nonfocal  Data Reviewed: Basic Metabolic Panel:  Lab 05/13/12 8469 05/12/12 0739 05/11/12 0720 05/08/12 0830 05/07/12 0930  NA -- 134* 132* 126* 128*  K -- 3.2* 3.3* 3.3* 3.9  CL -- 96 95* 88* 91*  CO2 -- 27 23 20 22   GLUCOSE -- 215* 108* 89 115*  BUN -- 45* 49* 77* 51*  CREATININE -- 3.61* 4.25* 5.63* 4.25*  CALCIUM -- 9.3 9.5 9.8 9.5  MG 1.5 -- -- -- --  PHOS 2.7 -- 2.4 3.1 --   Liver Function Tests:  Lab 05/11/12 0720 05/08/12 0830  AST 20 24  ALT 18 23  ALKPHOS 105 118*  BILITOT 1.7* 1.7*  PROT 5.1* 5.5*  ALBUMIN 1.4* 1.6*   CBC:  Lab 05/13/12 0817 05/11/12 0720  05/10/12 0530 05/10/12 0514 05/08/12 0830 05/07/12 0930  WBC 10.3 13.5* -- 21.6* 13.9* 15.5*  NEUTROABS -- -- 19.0* -- -- --  HGB 9.6* 10.6* -- 11.0* 11.5* 11.8*  HCT 29.0* 31.3* -- 33.4* 34.6* 35.6*  MCV 83.6 86.2 -- 84.6 85.0 88.1  PLT 99* 108* -- 126* 156 136*   CBG:  Lab 05/13/12 1614 05/13/12 1343 05/13/12 0753 05/13/12 0327 05/13/12 0140  GLUCAP 126* 106* 115* 117* 160*   Scheduled Meds: reviewed Continuous Infusions: reviewed _________________________________________________________________  05/13/2012, 5:50 PM  LOS: 41 days    Calvert Cantor, MD [phone]  On-Call/Text Page:      [email or web address]      password [email or web address]

## 2012-05-13 NOTE — Plan of Care (Signed)
Problem: Phase III Progression Outcomes Goal: Nasogastric tube discontinued Outcome: Completed/Met Date Met:  05/13/12 NG tube discontinued at 0745 on 05/13/12. Tolerated well by patient.

## 2012-05-13 NOTE — Progress Notes (Signed)
Subjective: Pt ok, seen in HD. No new c/o  Objective: Physical Exam: BP 154/82  Pulse 89  Temp 97.1 F (36.2 C) (Oral)  Resp 16  Ht 5\' 11"  (1.803 m)  Wt 120 lb 13 oz (54.8 kg)  BMI 16.85 kg/m2  SpO2 100% Drain intact, site clean, NT Output looks blood tinged enteric  Labs: CBC  Basename 05/13/12 0817 05/11/12 0720  WBC 10.3 13.5*  HGB 9.6* 10.6*  HCT 29.0* 31.3*  PLT 99* 108*   BMET  Basename 05/12/12 0739 05/11/12 0720  NA 134* 132*  K 3.2* 3.3*  CL 96 95*  CO2 27 23  GLUCOSE 215* 108*  BUN 45* 49*  CREATININE 3.61* 4.25*  CALCIUM 9.3 9.5   LFT  Basename 05/11/12 0720  PROT 5.1*  ALBUMIN 1.4*  AST 20  ALT 18  ALKPHOS 105  BILITOT 1.7*  BILIDIR --  IBILI --  LIPASE --   PT/INR No results found for this basename: LABPROT:2,INR:2 in the last 72 hours   Studies/Results: No results found.  Assessment/Plan: RLQ abscess and controlled fistula S/p perc drain 8/19 with upsize 8/14. No complaints  followed by [facility]    LOS: 41 days    Brayton El PA-C 05/13/2012 10:02 AM

## 2012-05-14 DIAGNOSIS — R58 Hemorrhage, not elsewhere classified: Secondary | ICD-10-CM

## 2012-05-14 LAB — CBC
MCH: 27.7 pg (ref 26.0–34.0)
MCHC: 32.5 g/dL (ref 30.0–36.0)
Platelets: 102 10*3/uL — ABNORMAL LOW (ref 150–400)
RDW: 19.6 % — ABNORMAL HIGH (ref 11.5–15.5)

## 2012-05-14 LAB — GLUCOSE, CAPILLARY
Glucose-Capillary: 109 mg/dL — ABNORMAL HIGH (ref 70–99)
Glucose-Capillary: 147 mg/dL — ABNORMAL HIGH (ref 70–99)
Glucose-Capillary: 148 mg/dL — ABNORMAL HIGH (ref 70–99)
Glucose-Capillary: 95 mg/dL (ref 70–99)

## 2012-05-14 MED ORDER — PENTAFLUOROPROP-TETRAFLUOROETH EX AERO: 1.0000 "application " | INHALATION_SPRAY | CUTANEOUS | Status: DC | PRN

## 2012-05-14 MED ORDER — NEPRO/CARBSTEADY PO LIQD: 237.0000 mL | ORAL | Status: DC | PRN

## 2012-05-14 MED ORDER — SODIUM CHLORIDE 0.9 % IV SOLN
100.0000 mL | INTRAVENOUS | Status: DC | PRN
  Administered 2012-05-28: 500 mL via INTRAVENOUS

## 2012-05-14 MED ORDER — LIDOCAINE-PRILOCAINE 2.5-2.5 % EX CREA: 1.0000 "application " | TOPICAL_CREAM | CUTANEOUS | Status: DC | PRN

## 2012-05-14 MED ORDER — HYDRALAZINE HCL 20 MG/ML IJ SOLN
10.0000 mg | Freq: Once | INTRAMUSCULAR | Status: AC
  Administered 2012-05-14: 10 mg via INTRAVENOUS
  Filled 2012-05-14 (×2): qty 0.5

## 2012-05-14 MED ORDER — HEPARIN SODIUM (PORCINE) 1000 UNIT/ML DIALYSIS
40.0000 [IU]/kg | Freq: Once | INTRAMUSCULAR | Status: AC
  Administered 2012-05-15: 2100 [IU] via INTRAVENOUS_CENTRAL
  Filled 2012-05-14: qty 3

## 2012-05-14 MED ORDER — FAT EMULSION 20 % IV EMUL
250.0000 mL | INTRAVENOUS | Status: DC
  Filled 2012-05-14: qty 250

## 2012-05-14 MED ORDER — SODIUM CHLORIDE 0.9 % IV SOLN
INTRAVENOUS | Status: DC
  Administered 2012-05-14: 16:00:00 via INTRAVENOUS
  Administered 2012-05-14: 10 mL/h via INTRAVENOUS

## 2012-05-14 MED ORDER — SODIUM CHLORIDE 0.9 % IV SOLN: 100.0000 mL | INTRAVENOUS | Status: DC | PRN

## 2012-05-14 MED ORDER — ALTEPLASE 2 MG IJ SOLR
2.0000 mg | Freq: Once | INTRAMUSCULAR | Status: AC | PRN
  Filled 2012-05-14: qty 2

## 2012-05-14 MED ORDER — HEPARIN SODIUM (PORCINE) 1000 UNIT/ML DIALYSIS
1000.0000 [IU] | INTRAMUSCULAR | Status: DC | PRN
  Filled 2012-05-14: qty 1

## 2012-05-14 MED ORDER — METOPROLOL TARTRATE 1 MG/ML IV SOLN
5.0000 mg | Freq: Once | INTRAVENOUS | Status: AC
  Administered 2012-05-15: 5 mg via INTRAVENOUS
  Filled 2012-05-14: qty 5

## 2012-05-14 MED ORDER — FAT EMULSION 20 % IV EMUL
250.0000 mL | INTRAVENOUS | Status: AC
  Administered 2012-05-14: 250 mL via INTRAVENOUS
  Filled 2012-05-14: qty 250

## 2012-05-14 MED ORDER — ZINC TRACE METAL 1 MG/ML IV SOLN
INTRAVENOUS | Status: AC
  Administered 2012-05-14: 17:00:00 via INTRAVENOUS
  Filled 2012-05-14: qty 2000

## 2012-05-14 MED ORDER — LIDOCAINE HCL (PF) 1 % IJ SOLN: 5.0000 mL | INTRAMUSCULAR | Status: DC | PRN

## 2012-05-14 NOTE — Progress Notes (Signed)
Pt arrived to the unit via bed. Alert an oriented x4. Bedridden. Pt has a stage II on saccrum measuring 2x2 with no drainage present. Dressing dry and intact. Pt is NPO. VS were stable. Denies any discomfort. Oriented to the unit and staff. Will cont to monitor.

## 2012-05-14 NOTE — Progress Notes (Signed)
Subjective: Interval History: none.  Objective: Vital signs in last 24 hours: Temp:  [97.1 F (36.2 C)-98.7 F (37.1 C)] 98.5 F (36.9 C) (08/23 0800) Pulse Rate:  [68-100] 68  (08/23 0800) Resp:  [15-21] 16  (08/23 0800) BP: (130-188)/(42-83) 158/78 mmHg (08/23 0800) SpO2:  [99 %-100 %] 100 % (08/23 0800) Weight:  [51.4 kg (113 lb 5.1 oz)] 51.4 kg (113 lb 5.1 oz) (08/22 1236) Weight change: -13.2 kg (-29 lb 1.6 oz)  Intake/Output from previous day: 08/22 0701 - 08/23 0700 In: 2105 [IV Piggyback:310; TPN:1790] Out: 3440 [Drains:140] Intake/Output this shift: Total I/O In: 70 [TPN:70] Out: -   General appearance: alert, cooperative and cachectic Resp: clear to auscultation bilaterally Cardio: S1, S2 normal and systolic murmur: systolic ejection 2/6, decrescendo at 2nd left intercostal space GI: pos bs,soft, ostomy bag mid abdm, wound open Extremities: AVG Mora Groin, B&T  Lab Results:  Basename 05/14/12 0355 05/13/12 0817  WBC 7.6 10.3  HGB 10.0* 9.6*  HCT 30.8* 29.0*  PLT 102* 99*   BMET:  Basename 05/12/12 0739  NA 134*  K 3.2*  CL 96  CO2 27  GLUCOSE 215*  BUN 45*  CREATININE 3.61*  CALCIUM 9.3   No results found for this basename: PTH:2 in the last 72 hours Iron Studies: No results found for this basename: IRON,TIBC,TRANSFERRIN,FERRITIN in the last 72 hours  Studies/Results: No results found.  I have reviewed the patient's current medications.  Assessment/Plan: 1 CRF for HD, will try to remove vol given.  Will need extra tx next week for vol 2 HTN vol related 3 Anemia stabl 4 Malnutrition on TNA starting pos 5 SBO/dehisc/abscess/fistula drain, AB, ? ReCT P HD, epo, AB    LOS: 42 days   [name] 05/14/2012,9:11 AM

## 2012-05-14 NOTE — Progress Notes (Addendum)
[facility] PROGRESS NOTE  ZORAWAR STROLLO ZOX:[id number] DOB: [birth year] DOA: 04/02/2012 PCP: Trevor Iha, MD  Assessment/Plan:  Acute blood loss From open abdominal wound - trending hemoglobin - PT and INR are within normal limits  SBO (small bowel obstruction) s/p lysis of adhesions and partial small bowel resection 7/24 *Subsequent anastomotic leak- omental patch of anastomosis 8/2  *CT abdomen and pelvis 04/29/2012 demonstrated several fluid collections consistent with intra-abdominal abscess with the largest measuring 16.5 x 13.7 x 6.4 cm.  *Patient underwent percutaneous drainage of largest fluid collection 04/30/2012 *Repeat CT done for increasing WBC count on 8/13 revealed enlarging abscess and enteric fistula and obstructed drainage tube. 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 *An attempt was made on 04/21/2012 to place a central line in interventional radiology under fluoroscopy but unfortunately central venous access could not be placed due to upper extremity and internal jugular vein access being chronically occluded bilaterally therefore rationale for why we have to continue to use this patient's right femoral Vas-Cath port for parenteral nutrition  Chronic nonocclusive left lower extremity DVT Diagnosed via Doppler 04/30/2012 - he continues with left lower extermity edema - f/u doppler is negative  Ileus following gastrointestinal surgery *Cont TNA via  right femoral Vas-Cath port per surgery  Atypical chest pain/abnormal cardiac enzyme *CT of the chest revealed no evidence of pulmonary embolism or aortic dissection *Suspect mildly elevated cardiac isoenzymes related to demand ischemia versus recent stressors of recurrent surgical procedures  CKD (chronic kidney disease) stage V requiring chronic dialysis *Dialysis per nephrology  Hypokalemia/hypophosphatemia/hyponatremia *Management per nephrology service/TNA  Thrombocytopenia *See above  Normocytic Anemia  *Stable. Due to acute blood loss and due to chronic disease - see above regarding wound bleeding *Has received a total of 4 units of packed red blood cells this admission *Cont aranesp and ferric gluconate  HTN (hypertension) Fair control - no change in tx plan today  Chronic use of steroids Steroid being slowly tapered down  Malnutrition, calorie *Patient cachectic at presentation  *Parenteral nutrition managed by surgery and pharmacy  Leg dialysis graft occlusion *s/p thrombectomy 7/25 - unfortunately re clotted - has undergone an additional thrombectomy procedure with revision of arterial and graft on 04/27/2012 *Was being dialyzed via HD cath in right groin but now graft is being used again *See discussion per vascular surgery concerning narrowing at the venous anastomosis  DVT prophylaxis: SCDs Code Status: full  Disposition Plan: transfer to 6700 pending attending MD evaluation  Consultants: General Surgery [facility] Infectious disease  Events since admission: 7/13 CT Abdomen with SBO  7/24 Ex-lap, lysis of adhesions, partial small bowel resection x2  7/25 Thrombectomy L femoral AV graft  7/26 Extubated, CVL is out  7/27 Transferred to telemetry under [facility]  8/2 Acute abdomen, dehiscence of anastomosis, SBO, back from OR intubated in ICU on PCCM service. Found to have moderate peritonitis infraumbilically 8/2 Extubated  Antibiotics: Zosyn 8/1  >>> Micafungin 8/3 >>8/16 Invanz 7/24 periop Fluconazole- 8/2 + 8/17 >> Vancomycin 8/8 >>> 05/01/2012  HPI/Subjective: Sleeping soundly when I entered the room. He awakened. States pain  is "tolerable" with current pain medications. Is tolerating a few ice chips but is reluctant to pursue water or other clears for fear of producing emesis or abnormal pain. No other complaints verbalized.  Objective: Filed Vitals:   05/13/12 1939 05/13/12 2348 05/14/12 0346 05/14/12 0800  BP:  188/74 159/70 158/78  Pulse:  81 76 68  Temp: 98.3 F (36.8 C) 98.7 F (37.1 C) 98.5 F (36.9 C) 98.5 F (36.9 C)  TempSrc: Oral Oral Oral Oral  Resp:  15 21 16   Height:      Weight:      SpO2:  100% 100% 100%    Intake/Output Summary (Last 24 hours) at 05/14/12 0851 Last data filed at 05/14/12 0800  Gross per 24 hour  Intake   2095 ml  Output   3440 ml  Net  -1345 ml    Exam:   General:  Alert, appears calm, in no acute distress  Cardiovascular: RRR, sinus rhythm, has grade 2/6 systolic murmur left sternal border fourth intercostal space  Respiratory: CTA bilaterally - Normal respiratory effort.  Abdomen: Soft and only minimally tender in the right lower quadrant  Musculoskeletal: Extremities are symmetrical, no joint effusion or traumatic injuries, no peripheral cyanosis  Neurological: Patient is alert, exam is nonfocal  Data Reviewed: Basic Metabolic Panel:  Lab 05/13/12 1610 05/12/12 0739 05/11/12 0720 05/08/12 0830 05/07/12 0930  NA -- 134* 132* 126* 128*  K -- 3.2* 3.3* 3.3* 3.9  CL -- 96 95* 88* 91*  CO2 -- 27 23 20 22   GLUCOSE -- 215* 108* 89 115*  BUN -- 45* 49* 77* 51*  CREATININE -- 3.61* 4.25* 5.63* 4.25*  CALCIUM -- 9.3 9.5 9.8 9.5  MG 1.5 -- -- -- --  PHOS 2.7 -- 2.4 3.1 --   Liver Function Tests:  Lab 05/11/12 0720 05/08/12 0830  AST 20 24  ALT 18 23  ALKPHOS 105 118*  BILITOT 1.7* 1.7*  PROT 5.1* 5.5*  ALBUMIN 1.4* 1.6*   CBC:  Lab 05/14/12 0355 05/13/12  0817 05/11/12 0720 05/10/12 0530 05/10/12 0514 05/08/12 0830  WBC 7.6 10.3 13.5* -- 21.6* 13.9*  NEUTROABS -- -- -- 19.0* -- --  HGB 10.0* 9.6* 10.6* -- 11.0* 11.5*  HCT 30.8* 29.0* 31.3* -- 33.4* 34.6*  MCV 85.3 83.6 86.2 -- 84.6 85.0  PLT 102* 99* 108* -- 126* 156   CBG:  Lab 05/14/12 0740 05/14/12 0348 05/13/12 2348 05/13/12 1927 05/13/12 1614  GLUCAP 89 114* 147* 107* 126*   Scheduled Meds: reviewed Continuous Infusions: reviewed _________________________________________________________________  05/14/2012, 8:51 AM  LOS: 42 days    Junious Silk, ANP [phone]  On-Call/Text Page:      [email or web address]      password [email or web address]  I have personally examined this patient and reviewed the entire database. I have reviewed the above note, made any necessary editorial changes, and agree with its content.  Lonia Blood, MD [facility]

## 2012-05-14 NOTE — Progress Notes (Signed)
Patient ID: [name], male   DOB: [birth year], 51 y.o.   MRN: [id number] Patient ID: XAVIUS SPADAFORE, male   DOB: May 29, 1961, 51 y.o.   MRN: [id number] Patient ID: VENCE LALOR, male   DOB: 01-12-1961, 51 y.o.   MRN: [id number] 17 Days Post-Op  Subjective: No abdominal pain this morning, no new complaints.    Objective: Vital signs in last 24 hours: Temp:  [97.1 F (36.2 C)-98.7 F (37.1 C)] 98.5 F (36.9 C) (08/23 0346) Pulse Rate:  [71-100] 76  (08/23 0346) Resp:  [14-21] 21  (08/23 0346) BP: (130-188)/(42-86) 159/70 mmHg (08/23 0346) SpO2:  [99 %-100 %] 100 % (08/23 0346) Weight:  [113 lb 5.1 oz (51.4 kg)-120 lb 13 oz (54.8 kg)] 113 lb 5.1 oz (51.4 kg) (08/22 1236) Last BM Date:  (unable to recall)  Intake/Output from previous day: 08/22 0701 - 08/23 0700 In: 2035 [IV Piggyback:310; TPN:1720] Out: 3440 [Drains:140] Intake/Output this shift:    General appearance: A/A/O Chest: CTA bilaterally Cardiac: RRR no M/R/G Abdomen: Eakins pouch with bilious output, soft, nontender, drain also with enteric output. (total output 140/24hr) Extremities: no edema    Lab Results:   [facility] 05/14/12 0355 05/13/12 0817  WBC 7.6 10.3  HGB 10.0* 9.6*  HCT 30.8* 29.0*  PLT 102* 99*   BMET  Basename 05/12/12 0739  NA 134*  K 3.2*  CL 96  CO2 27  GLUCOSE 215*  BUN 45*  CREATININE 3.61*  CALCIUM 9.3   PT/INR  Basename 05/13/12 1819  LABPROT 15.1  INR 1.17   ABG No results found for this basename: PHART:2,PCO2:2,PO2:2,HCO3:2 in the last 72 hours  Studies/Results: No results found.  Anti-infectives: Anti-infectives     Start     Dose/Rate Route Frequency Ordered Stop   05/08/12 1200   fluconazole (DIFLUCAN) IVPB 400 mg        400 mg 200 mL/hr over 60 Minutes Intravenous Every T-Th-Sa (Hemodialysis) 05/07/12 1752     04/30/12 1400   vancomycin (VANCOCIN) 750 mg in sodium chloride 0.9 % 150 mL IVPB  Status:  Discontinued        750 mg 150 mL/hr over 60 Minutes  Intravenous  Once 04/30/12 1212 04/30/12 1507   04/29/12 2330   vancomycin (VANCOCIN) 1,500 mg in sodium chloride 0.9 % 500 mL IVPB        1,500 mg 250 mL/hr over 120 Minutes Intravenous  Once 04/29/12 2259 04/30/12 0246   04/27/12 0000   ceFAZolin (ANCEF) IVPB 1 g/50 mL premix  Status:  Discontinued     Comments: Send with pt to OR      1 g 100 mL/hr over 30 Minutes Intravenous On call 04/26/12 0927 04/26/12 0928   04/24/12 1000   micafungin (MYCAMINE) 100 mg in sodium chloride 0.9 % 100 mL IVPB  Status:  Discontinued        100 mg 100 mL/hr over 1 Hours Intravenous Daily 04/24/12 0750 05/07/12 1713   04/23/12 1600   fluconazole (DIFLUCAN) IVPB 400 mg        400 mg 200 mL/hr over 60 Minutes Intravenous  Once 04/23/12 1428 04/23/12 1800   04/22/12 2200   piperacillin-tazobactam (ZOSYN) IVPB 3.375 g  Status:  Discontinued        3.375 g 100 mL/hr over 30 Minutes Intravenous 3 times per day 04/22/12 2120 04/22/12 2137   04/22/12 2200   piperacillin-tazobactam (ZOSYN) IVPB 2.25 g        2.25 g  100 mL/hr over 30 Minutes Intravenous 3 times per day 04/22/12 2140     04/22/12 2157   gentamicin (GARAMYCIN) injection  Status:  Discontinued          As needed 04/22/12 2158 04/22/12 2335   04/22/12 2156   clindamycin (CLEOCIN) injection  Status:  Discontinued          As needed 04/22/12 2157 04/22/12 2335   04/22/12 2145   piperacillin-tazobactam (ZOSYN) IVPB 3.375 g        3.375 g 12.5 mL/hr over 240 Minutes Intravenous  Once 04/22/12 2141 04/23/12 0145   04/22/12 2130   gentamicin (GARAMYCIN) 240 mg, clindamycin (CLEOCIN) 900 mg in sodium chloride irrigation 0.9 % 1,000 mL irrigation  Status:  Discontinued         Irrigation  Once 04/22/12 2119 04/23/12 0020   04/14/12 0830   ertapenem (INVANZ) 1 g in sodium chloride 0.9 % 50 mL IVPB        1 g 100 mL/hr over 30 Minutes Intravenous To Surgery 04/14/12 0816 04/14/12 0834   04/13/12 1359   ertapenem (INVANZ) 1 g in sodium chloride  0.9 % 50 mL IVPB  Status:  Discontinued        1 g 100 mL/hr over 30 Minutes Intravenous 60 min pre-op 04/13/12 1359 04/14/12 0816          Assessment/Plan: s/p Procedure(s) (LRB): THROMBECTOMY ARTERIOVENOUS GORE-TEX GRAFT (Left) REVISION OF ARTERIOVENOUS GORETEX GRAFT (Left)  S/P SBR with EC fistula - continue Eakins pouch and IR drain.  Can continue with sips of water and ice chips.  ID - Zosyn FEN - TNA ESRD - per renal, staples in groin per vascular.  LOS: 42 days    Cornie Herrington 05/14/2012

## 2012-05-14 NOTE — Progress Notes (Signed)
PARENTERAL NUTRITION CONSULT NOTE - FOLLOW UP  Pharmacy Consult for TPN Indication: SBO with new EC fistula  Allergies  Allergen Reactions  . Allopurinol     REACTION: decreased platelets  . Aspirin     REACTION: unspecified    Patient Measurements: Height: 5\' 11"  (180.3 cm) Weight: 113 lb 5.1 oz (51.4 kg) IBW/kg (Calculated) : 75.3    Vital Signs: Temp: 98.5 F (36.9 C) (08/23 0346) Temp src: Oral (08/23 0346) BP: 159/70 mmHg (08/23 0346) Pulse Rate: 76  (08/23 0346) Intake/Output from previous day: 08/22 0701 - 08/23 0700 In: 2035 [IV Piggyback:310; TPN:1720] Out: 3440 [Drains:140] Intake/Output from this shift:    Labs:  Basename 05/14/12 0355 05/13/12 1819 05/13/12 0817 05/11/12 0720  WBC 7.6 -- 10.3 13.5*  HGB 10.0* -- 9.6* 10.6*  HCT 30.8* -- 29.0* 31.3*  PLT 102* -- 99* 108*  APTT -- -- -- --  INR -- 1.17 -- --     Basename 05/13/12 0845 05/12/12 0739 05/11/12 0720  NA -- 134* 132*  K -- 3.2* 3.3*  CL -- 96 95*  CO2 -- 27 23  GLUCOSE -- 215* 108*  BUN -- 45* 49*  CREATININE -- 3.61* 4.25*  LABCREA -- -- --  CREAT24HRUR -- -- --  CALCIUM -- 9.3 9.5  MG 1.5 -- --  PHOS 2.7 -- 2.4  PROT -- -- 5.1*  ALBUMIN -- -- 1.4*  AST -- -- 20  ALT -- -- 18  ALKPHOS -- -- 105  BILITOT -- -- 1.7*  BILIDIR -- -- --  IBILI -- -- --  PREALBUMIN -- -- --  TRIG -- -- --  CHOLHDL -- -- --  CHOL -- -- --   Estimated Creatinine Clearance: 17.4 ml/min (by C-G formula based on Cr of 3.61).    Basename 05/14/12 0348 05/13/12 2348 05/13/12 1927  GLUCAP 114* 147* 107*   Insulin Requirements in the past 24 hours:  2 units SSI + 20 units regular insulin in TNA.   Nutritional Goals:  1700-1950 kCal, 75-85 grams of protein per day   Current Nutrition:  Clinimix 5/15 at 70 ml/hr with 20% lipids at 10 ml/hr MWF provides an average of 1398 kcal and 84g protein per day  Admit: Admitted 7/13 with abd pain, weight loss, SBO. S/p LOA, SBR 7/24 and repeat LOA, SBR  with anasomotic leak repair 8/1, thrombectomy with AVG revision 7/25 and repeat thrombectomy 8/6. CT on 8/13 reveals enlarging abscess and enteric fistula.  GI: NGT out, wound drainage O/P trending down overall; tolerating TPN  Endo: CBGs well controlled, minimal use of SSI. Solu-Cortef daily.  Lytes: No new labs. Hypercalcemia (corrected Ca 11.38, Ca x PO4 < 55),  Renal: ESRD with HD typically TTS. HD is currently being administered prn d/t volume issues - received HD 8/19,20,22. On Aranesp qSat.  Pulm: extubated on 8/1; stable on RA  Cards: BP mildly elevated (had hypotensive during HD). HR mostly controlled. C/o CP on 8/14 unrelieved with NTG x3 doses, troponin 1.76 -- thought to be related to demand ischemia. No further CP. On scheduled lopressor and prn hydralazine.  Hepatobil: LFTs WNL 8/20. Prealbumin was at goal (8/18). Trig WNL.  Neuro: GCS 15, no c/o pain - on fentanyl patch, prn pain meds.  ID: Fluconazole + Zosyn for peritonitis/abd abscess -- ID following. Afebrile, WBC decreasing. Abd abscess culture: candida tropicalis.  Zosyn 8.1>>  Diflucan 8.16>> (s/p 14 d mycamine) Best Practices: Mouthcare, PPI-IV   Plan:  - Continue Clinimix  5/15 @ 70 ml/hr to avoid exacerbating volume status and patient to start PO's soon hopefully. MD / RD aware TPN not meeting goals with Clinimix alone.  - Provide available trace elements, MVI and lipids @10cc /hr on MWF only d/t national backorder  - Check AM BMET  Blaklee Shores K. Allena Katz, PharmD, BCPS.  Clinical Pharmacist Pager [phone]. 05/14/2012 7:13 AM

## 2012-05-14 NOTE — Progress Notes (Signed)
17 Days Post-Op  Subjective: RLQ drain placed 8/9; up sized 8/18 Output significant Post bowel obstruction surgery  Objective: Vital signs in last 24 hours: Temp:  [97.1 F (36.2 C)-98.7 F (37.1 C)] 98.5 F (36.9 C) (08/23 0800) Pulse Rate:  [68-100] 68  (08/23 0800) Resp:  [15-21] 16  (08/23 0800) BP: (130-188)/(42-83) 158/78 mmHg (08/23 0800) SpO2:  [99 %-100 %] 100 % (08/23 0800) Weight:  [113 lb 5.1 oz (51.4 kg)] 113 lb 5.1 oz (51.4 kg) (08/22 1236) Last BM Date:  (unable to recall)  Intake/Output from previous day: 08/22 0701 - 08/23 0700 In: 2105 [IV Piggyback:310; TPN:1790] Out: 3440 [Drains:140] Intake/Output this shift: Total I/O In: 70 [TPN:70] Out: -   PE: VSS; afeb Output 140cc yesterday Dark; bloody fluid Site clean and dry  Lab Results:   [facility] 05/14/12 0355 05/13/12 0817  WBC 7.6 10.3  HGB 10.0* 9.6*  HCT 30.8* 29.0*  PLT 102* 99*   BMET  Basename 05/12/12 0739  NA 134*  K 3.2*  CL 96  CO2 27  GLUCOSE 215*  BUN 45*  CREATININE 3.61*  CALCIUM 9.3   PT/INR  Basename 05/13/12 1819  LABPROT 15.1  INR 1.17   ABG No results found for this basename: PHART:2,PCO2:2,PO2:2,HCO3:2 in the last 72 hours  Studies/Results: No results found.  Anti-infectives:   Assessment/Plan: s/p Procedure(s) (LRB): THROMBECTOMY ARTERIOVENOUS GORE-TEX GRAFT (Left) REVISION OF ARTERIOVENOUS GORETEX GRAFT (Left)  Drain in place; intact Output significant Will follow Plan per CCS  Krisandra Bueno A 05/14/2012

## 2012-05-14 NOTE — Progress Notes (Signed)
Hopefully with continued nutrition this will heal Patient examined and I agree with the assessment and plan  Violeta Gelinas, MD, MPH, FACS Pager: [phone]  05/14/2012 2:33 PM

## 2012-05-14 NOTE — Consult Note (Signed)
Wound care follow-up:  Eakin pouch seal lasted 4 days.  New small Eakin pouch applied to abd wound.  Wound beefy red with retention sutures visible, mod dark red bloody drainage.  Wound decreasing in size slowly, 14X2X.5cm.  Upper wound beefy red and shallow, covered by Aquacel and bridge of barrier from Eakin pouch to prevent leakage, since wound is longer than available pouch.  Pt denies pain during procedure.  Supplies at bedside for staff use if leakage occurs.  Cammie Mcgee, RN, MSN, [employer]  [phone]

## 2012-05-14 NOTE — Progress Notes (Signed)
Nutrition Follow-up/Consult  Intervention:   1. Continue TPN per pharmacy 2. Recommend trophic enteral feedings once return of bowel function - will defer to surgery 3. RD to continue to follow nutrition care plan   Assessment:   Pt's TPN decreased back to 70 ml/hr to avoid exacerbating volume status and patient to start PO's soon possibly. Patient is now receiving TPN with Clinimix 5/15 @ 70 ml/hr. Lipids (20% IVFE @ 10 ml/hr), multivitamins, and trace elements are provided 3 times weekly (MWF) due to national backorder. Provides 1398 kcal and 84 grams protein daily (based on weekly average). Meets 82% minimum estimated kcal and 100% estimated protein needs.  Per surgery, doing okay with NGT out. Fistula is controlled and plan to continue TNA. Pt allowed to have ice chips and sips of water. Per chart review, pt is tolerating ice chips but is reluctant to pursue water/clears 2/2 fear of emesis or abdominal pain.  Diet Order:  NPO  Meds: Scheduled Meds:    . antiseptic oral rinse  15 mL Mouth Rinse q12n4p  . chlorhexidine  15 mL Mouth Rinse BID  . darbepoetin (ARANESP) injection - DIALYSIS  200 mcg Intravenous Q Sat-HD  . fentaNYL  37.5 mcg Transdermal Q72H  . fluconazole (DIFLUCAN) IV  400 mg Intravenous Q T,Th,Sa-HD  . heparin  40 Units/kg Dialysis Once in dialysis  . hydrocortisone sod succinate (SOLU-CORTEF) injection  25 mg Intravenous Daily  . insulin aspart  0-15 Units Subcutaneous Q4H  . lip balm   Topical BID  . metoprolol  5 mg Intravenous Q6H  . pantoprazole (PROTONIX) IV  40 mg Intravenous Q24H  . piperacillin-tazobactam (ZOSYN)  IV  2.25 g Intravenous Q8H  . sodium chloride  10-40 mL Intracatheter Q12H   Continuous Infusions:    . fat emulsion 250 mL (05/12/12 1716)  . fat emulsion    . TPN (CLINIMIX) +/- additives 70 mL/hr at 05/12/12 1717  . TPN (CLINIMIX) +/- additives 70 mL/hr at 05/13/12 1722  . TPN (CLINIMIX) +/- additives     PRN Meds:.sodium chloride,  sodium chloride, acetaminophen, acetaminophen, albuterol, alteplase, feeding supplement (NEPRO CARB STEADY), feeding supplement (NEPRO CARB STEADY), heparin, heparin, hydrALAZINE, lidocaine, lidocaine-prilocaine, morphine injection, nitroGLYCERIN, ondansetron (ZOFRAN) IV, ondansetron, pentafluoroprop-tetrafluoroeth, promethazine, sodium chloride, DISCONTD: lidocaine, DISCONTD: lidocaine-prilocaine DISCONTD: pentafluoroprop-tetrafluoroeth  Labs:  CMP     Component Value Date/Time   NA 134* 05/12/2012 0739   K 3.2* 05/12/2012 0739   CL 96 05/12/2012 0739   CO2 27 05/12/2012 0739   GLUCOSE 215* 05/12/2012 0739   BUN 45* 05/12/2012 0739   CREATININE 3.61* 05/12/2012 0739   CALCIUM 9.3 05/12/2012 0739   PROT 5.1* 05/11/2012 0720   ALBUMIN 1.4* 05/11/2012 0720   AST 20 05/11/2012 0720   ALT 18 05/11/2012 0720   ALKPHOS 105 05/11/2012 0720   BILITOT 1.7* 05/11/2012 0720   GFRNONAA 18* 05/12/2012 0739   GFRAA 21* 05/12/2012 0739   Phosphorus  Date/Time Value Range Status  05/13/2012  8:45 AM 2.7  2.3 - 4.6 mg/dL Final  1/47/8295  6:21 AM 2.4  2.3 - 4.6 mg/dL Final  11/27/6576  4:69 AM 3.1  2.3 - 4.6 mg/dL Final   Potassium  Date/Time Value Range Status  05/12/2012  7:39 AM 3.2* 3.5 - 5.1 mEq/L Final  05/11/2012  7:20 AM 3.3* 3.5 - 5.1 mEq/L Final  05/08/2012  8:30 AM 3.3* 3.5 - 5.1 mEq/L Final   Magnesium  Date/Time Value Range Status  05/13/2012  8:45 AM 1.5  1.5 - 2.5 mg/dL Final  1/61/0960  4:54 AM 1.6  1.5 - 2.5 mg/dL Final  0/98/1191  4:78 AM 1.7  1.5 - 2.5 mg/dL Final   Prealbumin  Date/Time Value Range Status  05/10/2012  5:30 AM 26.6  17.0 - 34.0 mg/dL Final  2/95/6213  0:86 AM 25.1  17.0 - 34.0 mg/dL Final  01/27/8468  6:29 AM 7.7* 17.0 - 34.0 mg/dL Final    Intake/Output Summary (Last 24 hours) at 05/14/12 0913 Last data filed at 05/14/12 0800  Gross per 24 hour  Intake   2015 ml  Output   3440 ml  Net  -1425 ml   Weight Status: 51.5 kg s/p HD on 8/22 - trending down 52.1 kg s/p HD  on 8/20  54 kg s/p HD on 8/17 63.8 kg s/p HD on 8/12 65.7 kg s/p HD on 8/8  50.5 kg s/p HD on 8/6 50 kg s/p HD on 8/5 47.8 kg s/p HD on 7/30  48.3 kg s/p HD on 7/27  Body mass index is 15.80 kg/(m^2). Underweight.  Estimated needs:  [calculated using the Kidney Disease Outcomes Quality Initiative (KDOQI) Adjustment Equation for the Underweight Patient]: 1700 - 1950 kcal, 80 - 100 grams protein daily  Nutrition Dx:  Inadequate oral intake now R/T GI distress AEB pt report. Ongoing.  Goal:  Intake to meet at least 90% of estimated needs. Not met.  Monitor:  Diet advancement, TPN adequacy, weights, labs, I/O's  Jarold Motto MS, RD, LDN Pager: [phone] After-hours pager: [phone]

## 2012-05-15 ENCOUNTER — Inpatient Hospital Stay (HOSPITAL_COMMUNITY): Payer: Medicare Other

## 2012-05-15 LAB — CBC
HCT: 29.4 % — ABNORMAL LOW (ref 39.0–52.0)
Hemoglobin: 9.9 g/dL — ABNORMAL LOW (ref 13.0–17.0)
MCH: 27.8 pg (ref 26.0–34.0)
MCHC: 33.7 g/dL (ref 30.0–36.0)

## 2012-05-15 LAB — RENAL FUNCTION PANEL
BUN: 82 mg/dL — ABNORMAL HIGH (ref 6–23)
Calcium: 9.7 mg/dL (ref 8.4–10.5)
Creatinine, Ser: 5.4 mg/dL — ABNORMAL HIGH (ref 0.50–1.35)
Glucose, Bld: 88 mg/dL (ref 70–99)
Phosphorus: 2.3 mg/dL (ref 2.3–4.6)
Sodium: 127 mEq/L — ABNORMAL LOW (ref 135–145)

## 2012-05-15 LAB — GLUCOSE, CAPILLARY: Glucose-Capillary: 148 mg/dL — ABNORMAL HIGH (ref 70–99)

## 2012-05-15 MED ORDER — LABETALOL HCL 5 MG/ML IV SOLN
20.0000 mg | Freq: Once | INTRAVENOUS | Status: DC
  Filled 2012-05-15: qty 4

## 2012-05-15 MED ORDER — DARBEPOETIN ALFA-POLYSORBATE 200 MCG/0.4ML IJ SOLN
INTRAMUSCULAR | Status: AC
  Administered 2012-05-15: 200 ug via INTRAVENOUS
  Filled 2012-05-15: qty 0.4

## 2012-05-15 MED ORDER — MORPHINE SULFATE 2 MG/ML IJ SOLN
INTRAMUSCULAR | Status: AC
  Administered 2012-05-15: 2 mg via INTRAVENOUS
  Filled 2012-05-15: qty 1

## 2012-05-15 MED ORDER — PANTOPRAZOLE SODIUM 40 MG PO TBEC
40.0000 mg | DELAYED_RELEASE_TABLET | Freq: Every day | ORAL | Status: DC
  Administered 2012-05-15 – 2012-05-21 (×7): 40 mg via ORAL
  Filled 2012-05-15 (×7): qty 1

## 2012-05-15 MED ORDER — INSULIN REGULAR HUMAN 100 UNIT/ML IJ SOLN
INTRAVENOUS | Status: AC
  Administered 2012-05-15: 17:00:00 via INTRAVENOUS
  Filled 2012-05-15: qty 2000

## 2012-05-15 NOTE — Progress Notes (Signed)
[facility] on call notified that Pt had a BP of 197/77 HR 73 at 2013 new orders received and implemented at 2225 pt BP increased to 238/83 MD notified about this increase and orders received pt also was given 2mg  of morphine at 2302 because pt c/o pain in abdomen. BP was rechecked 247/108 5 mg of Lopressor given IV Bp decreased 202/85 and md notified  New orders received Bp checked at 0222 decreased to 163/73 MD notified and medication that was ordered was discontinued will continue to monitor. Ilean Skill LPN

## 2012-05-15 NOTE — Progress Notes (Signed)
Subjective: Interval History: has no complaint of , is tol ice chips.  Objective: Vital signs in last 24 hours: Temp:  [97.8 F (36.6 C)-98.6 F (37 C)] 98.5 F (36.9 C) (08/24 0739) Pulse Rate:  [68-111] 73  (08/24 0739) Resp:  [14-20] 16  (08/24 0739) BP: (152-247)/(71-108) 152/79 mmHg (08/24 0739) SpO2:  [96 %-100 %] 98 % (08/24 0739) Weight:  [54.2 kg (119 lb 7.8 oz)-62.823 kg (138 lb 8 oz)] 54.2 kg (119 lb 7.8 oz) (08/24 0739) Weight change: 8.023 kg (17 lb 11 oz)  Intake/Output from previous day: 08/23 0701 - 08/24 0700 In: 2060 [P.O.:50; I.V.:240; IV Piggyback:150; TPN:1610] Out: 30 [Drains:30] Intake/Output this shift:    General appearance: alert, cooperative and cachectic Resp: clear to auscultation bilaterally Cardio: S1, S2 normal and systolic murmur: holosystolic 2/6, blowing at apex GI: pos bs, but not active, drain lower abdm, wound mid abdm with ostomy bag Extremities: AVG L groin  Lab Results:  Basename 05/14/12 0355 05/13/12 0817  WBC 7.6 10.3  HGB 10.0* 9.6*  HCT 30.8* 29.0*  PLT 102* 99*   BMET: No results found for this basename: NA:2,K:2,CL:2,CO2:2,GLUCOSE:2,BUN:2,CREATININE:2,CALCIUM:2 in the last 72 hours No results found for this basename: PTH:2 in the last 72 hours Iron Studies: No results found for this basename: IRON,TIBC,TRANSFERRIN,FERRITIN in the last 72 hours  Studies/Results: No results found.  I have reviewed the patient's current medications.  Assessment/Plan: 1 ESRD vol xs , for HD.  Will need extra HD Mon 2 Anemia stabel 3 Malnutrition on TNA, sips 4 SBO/Dehisc/abscess/fistula  5 HPTH   P HD, TNA, AB, ?Re CT.    LOS: 43 days   [place] Oborn L 05/15/2012,7:49 AM

## 2012-05-15 NOTE — Procedures (Signed)
I was present at this session.  I have reviewed the session itself and made appropriate changes.  HD via L leg avg, vol xs.  Arles Rumbold L 8/24/20137:53 AM

## 2012-05-15 NOTE — Progress Notes (Signed)
18 Days Post-Op  Subjective: Seen in HD.  No complaints.  Objective: Vital signs in last 24 hours: Temp:  [97.8 F (36.6 C)-98.6 F (37 C)] 98.5 F (36.9 C) (08/24 0739) Pulse Rate:  [69-111] 96  (08/24 1000) Resp:  [14-20] 16  (08/24 1000) BP: (115-247)/(60-108) 123/60 mmHg (08/24 1000) SpO2:  [96 %-100 %] 98 % (08/24 0739) Weight:  [119 lb 7.8 oz (54.2 kg)-138 lb 8 oz (62.823 kg)] 119 lb 7.8 oz (54.2 kg) (08/24 0739) Last BM Date:  (unable to recall)  Intake/Output from previous day: 08/23 0701 - 08/24 0700 In: 2060 [P.O.:50; I.V.:240; IV Piggyback:150; TPN:1610] Out: 30 [Drains:30] Intake/Output this shift:    Incision/Wound:Eakins pouch in place.  Drains in place.  Enteric contents noted  Lab Results:   Basename 05/15/12 0900 05/14/12 0355  WBC 7.9 7.6  HGB 9.9* 10.0*  HCT 29.4* 30.8*  PLT 103* 102*   BMET  Basename 05/15/12 0900  NA 127*  K 3.5  CL 91*  CO2 19  GLUCOSE 88  BUN 82*  CREATININE 5.40*  CALCIUM 9.7   PT/INR  Basename 05/13/12 1819  LABPROT 15.1  INR 1.17   ABG No results found for this basename: PHART:2,PCO2:2,PO2:2,HCO3:2 in the last 72 hours  Studies/Results: No results found.  Anti-infectives: Anti-infectives     Start     Dose/Rate Route Frequency Ordered Stop   05/08/12 1200   fluconazole (DIFLUCAN) IVPB 400 mg        400 mg 200 mL/hr over 60 Minutes Intravenous Every T-Th-Sa (Hemodialysis) 05/07/12 1752     04/30/12 1400   vancomycin (VANCOCIN) 750 mg in sodium chloride 0.9 % 150 mL IVPB  Status:  Discontinued        750 mg 150 mL/hr over 60 Minutes Intravenous  Once 04/30/12 1212 04/30/12 1507   04/29/12 2330   vancomycin (VANCOCIN) 1,500 mg in sodium chloride 0.9 % 500 mL IVPB        1,500 mg 250 mL/hr over 120 Minutes Intravenous  Once 04/29/12 2259 04/30/12 0246   04/27/12 0000   ceFAZolin (ANCEF) IVPB 1 g/50 mL premix  Status:  Discontinued     Comments: Send with pt to OR      1 g 100 mL/hr over 30 Minutes  Intravenous On call 04/26/12 0927 04/26/12 0928   04/24/12 1000   micafungin (MYCAMINE) 100 mg in sodium chloride 0.9 % 100 mL IVPB  Status:  Discontinued        100 mg 100 mL/hr over 1 Hours Intravenous Daily 04/24/12 0750 05/07/12 1713   04/23/12 1600   fluconazole (DIFLUCAN) IVPB 400 mg        400 mg 200 mL/hr over 60 Minutes Intravenous  Once 04/23/12 1428 04/23/12 1800   04/22/12 2200   piperacillin-tazobactam (ZOSYN) IVPB 3.375 g  Status:  Discontinued        3.375 g 100 mL/hr over 30 Minutes Intravenous 3 times per day 04/22/12 2120 04/22/12 2137   04/22/12 2200   piperacillin-tazobactam (ZOSYN) IVPB 2.25 g        2.25 g 100 mL/hr over 30 Minutes Intravenous 3 times per day 04/22/12 2140     04/22/12 2157   gentamicin (GARAMYCIN) injection  Status:  Discontinued          As needed 04/22/12 2158 04/22/12 2335   04/22/12 2156   clindamycin (CLEOCIN) injection  Status:  Discontinued          As needed 04/22/12 2157 04/22/12  2335   04/22/12 2145   piperacillin-tazobactam (ZOSYN) IVPB 3.375 g        3.375 g 12.5 mL/hr over 240 Minutes Intravenous  Once 04/22/12 2141 04/23/12 0145   04/22/12 2130   gentamicin (GARAMYCIN) 240 mg, clindamycin (CLEOCIN) 900 mg in sodium chloride irrigation 0.9 % 1,000 mL irrigation  Status:  Discontinued         Irrigation  Once 04/22/12 2119 04/23/12 0020   04/14/12 0830   ertapenem (INVANZ) 1 g in sodium chloride 0.9 % 50 mL IVPB        1 g 100 mL/hr over 30 Minutes Intravenous To Surgery 04/14/12 0816 04/14/12 0834   04/13/12 1359   ertapenem (INVANZ) 1 g in sodium chloride 0.9 % 50 mL IVPB  Status:  Discontinued        1 g 100 mL/hr over 30 Minutes Intravenous 60 min pre-op 04/13/12 1359 04/14/12 0816          Assessment/Plan: s/p Procedure(s) (LRB): THROMBECTOMY ARTERIOVENOUS GORE-TEX GRAFT (Left) REVISION OF ARTERIOVENOUS GORETEX GRAFT (Left) 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    LOS: 43 days     Stephen Cinco A. 05/15/2012

## 2012-05-15 NOTE — Progress Notes (Signed)
PARENTERAL NUTRITION CONSULT NOTE - FOLLOW UP  Pharmacy Consult for TPN Indication: SBO with new EC fistula  Allergies  Allergen Reactions  . Allopurinol     REACTION: decreased platelets  . Aspirin     REACTION: unspecified    Patient Measurements: Height: 5\' 11"  (180.3 cm) Weight: 119 lb 7.8 oz (54.2 kg) IBW/kg (Calculated) : 75.3    Vital Signs: Temp: 98.5 F (36.9 C) (08/24 0739) Temp src: Oral (08/24 0739) BP: 123/60 mmHg (08/24 1000) Pulse Rate: 96  (08/24 1000) Intake/Output from previous day: 08/23 0701 - 08/24 0700 In: 2060 [P.O.:50; I.V.:240; IV Piggyback:150; TPN:1610] Out: 30 [Drains:30] Intake/Output from this shift:    Labs:  Basename 05/15/12 0900 05/14/12 0355 05/13/12 1819 05/13/12 0817  WBC 7.9 7.6 -- 10.3  HGB 9.9* 10.0* -- 9.6*  HCT 29.4* 30.8* -- 29.0*  PLT 103* 102* -- 99*  APTT -- -- -- --  INR -- -- 1.17 --     Basename 05/15/12 0900 05/13/12 0845  NA 127* --  K 3.5 --  CL 91* --  CO2 19 --  GLUCOSE 88 --  BUN 82* --  CREATININE 5.40* --  LABCREA -- --  CREAT24HRUR -- --  CALCIUM 9.7 --  MG -- 1.5  PHOS 2.3 2.7  PROT -- --  ALBUMIN 1.5* --  AST -- --  ALT -- --  ALKPHOS -- --  BILITOT -- --  BILIDIR -- --  IBILI -- --  PREALBUMIN -- --  TRIG -- --  CHOLHDL -- --  CHOL -- --   Estimated Creatinine Clearance: 12.3 ml/min (by C-G formula based on Cr of 5.4).    Basename 05/15/12 0401 05/15/12 0003 05/14/12 2006  GLUCAP 111* 148* 148*   Insulin Requirements in the past 24 hours:  4 units SSI + 20 units regular insulin in TNA.   Nutritional Goals:  1700-1950 kCal, 75-85 grams of protein per day   Current Nutrition:  Clinimix 5/15 at 70 ml/hr with 20% lipids at 10 ml/hr MWF provides an average of 1398 kcal and 84g protein per day  Admit: Admitted 7/13 with abd pain, weight loss, SBO. S/p LOA, SBR 7/24 and repeat LOA, SBR with anasomotic leak repair 8/1, thrombectomy with AVG revision 7/25 and repeat thrombectomy  8/6. CT on 8/13 reveals enlarging abscess and enteric fistula.  GI: NGT out, wound drainage O/P trending down overall; tolerating TPN. Pt allowed to have ice chips and sips of water. Per RD note, pt is tolerating ice chips but is reluctant to pursue water/clears 2/2 fear of emesis or abdominal pain. Endo: CBGs well controlled, minimal use of SSI. Solu-Cortef daily.  Lytes: Na low, d/t overload and "dilute" TPN. K ok. Hypercalcemia (corrected Ca 11.7, Ca x PO4 < 55),  Renal: ESRD with HD typically TTS. HD is currently being administered prn d/t volume issues - received HD 8/19,20,22, 24. On Aranesp qSat.  Pulm: extubated on 8/1; stable on RA  Cards: BP mildly elevated (had hypotensive during HD). HR mostly controlled. C/o CP on 8/14 unrelieved with NTG x3 doses, troponin 1.76 -- thought to be related to demand ischemia. No further CP. On scheduled lopressor and prn hydralazine.  Hepatobil: LFTs WNL 8/20. Prealbumin was at goal (8/18). Trig WNL.  Neuro: GCS 15, no c/o pain - on fentanyl patch, prn pain meds.  ID: Fluconazole + Zosyn for peritonitis/abd abscess -- ID following. Afebrile, WBC decreasing. Abd abscess culture: candida tropicalis.  Zosyn 8.1>>  Diflucan 8.16>> (s/p 14  d mycamine) Best Practices: Mouthcare, PPI-IV   Plan:  - Continue Clinimix 5/15 @ 70 ml/hr to avoid exacerbating volume status and patient to start PO's soon hopefully. MD / RD aware TPN not meeting goals with Clinimix alone.  - Provide available trace elements, MVI and lipids @10cc /hr on MWF only d/t national backorder  - Next labs Monday  Thanks, Antara Brecheisen K. Allena Katz, PharmD, BCPS.  Clinical Pharmacist Pager [phone]. 05/15/2012 10:19 AM

## 2012-05-15 NOTE — Progress Notes (Signed)
[facility] PROGRESS NOTE  PAULANTHONY GLEAVES WUJ:[id number] DOB: 06/23/1961 DOA: 04/02/2012 PCP: Trevor Iha, MD  Assessment/Plan: Principal Problem: SBO (small bowel obstruction) s/p exploratory laparotomy and lysis of adhesions and partial small bowel x 2 on 7/24 *Subsequent anastomotic leak- omental patch of anastomosis 8/2  *CT abdomen and pelvis 04/29/2012 demonstrated several fluid collections consistent with intra-abdominal abscess with the largest measuring 16.5 x 13.7 x 6.4 cm.  *Patient underwent percutaneous drainage of largest fluid collection 04/30/2012  *Repeat CT done for increasing WBC count on 8/13 revealed enlarging abscess and enteric fistula and obstructed drainage tube. 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  *An attempt was made on 04/21/2012 to place a central line in interventional radiology under fluoroscopy but unfortunately central venous access could not be placed due to upper extremity and internal jugular vein access being chronically occluded bilaterally therefore rationale for why we have to continue to use this patient's right femoral Vas-Cath port for parenteral nutrition   Chronic nonocclusive left lower extremity DVT  *Diagnosed via Doppler 04/30/2012 - he continues with left lower extermity edema - f/u doppler is negative   CKD (chronic kidney disease) stage V requiring chronic dialysis  *Dialysis per nephrology    HTN  (hypertension) *Adequate control. I will continue current meds.  Anemia *Stable. Due to acute blood loss and due to chronic disease - see above regarding wound bleeding  *Has received a total of 4 units of packed red blood cells this admission  *Cont aranesp and ferric gluconate   Chronic use of steroids *Steroids being slowly tapered   Leg graft occlusion **s/p thrombectomy 7/25 - unfortunately re clotted - has undergone an additional thrombectomy procedure with revision of arterial and graft on 04/27/2012  *Was being dialyzed via HD cath in right groin but now graft is being used again  *See discussion per vascular surgery concerning narrowing at the venous anastomosis  Severe protein calorie malnutrition *Patient cachectic at presentation  *Parenteral nutrition managed by surgery and pharmacy   Code Status: Full Family Communication: Not applicable Disposition Plan: Not yet determined  Nikolaus Pienta A.  [facility] Pager [phone]. 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 He relates one day prior to admission he started having abdominal pain which progressively got worse it was mainly periumbilical nothing made it better or worse had no radiation. But in the middle of the night he took some TUMS and the pain did not improve. After this he started vomiting about 3 or 4 times. So he decided to come here to the emergency room. Here in the emergency room his pain was relieved by narcotics. A CT scan of the abdomen and pelvis was done that shows small bowel obstruction we were asked to mid  and further evaluate.   Consultants: General  surgery Nephrology Infectious disease the  Procedures: 7/24 Ex-lap, lysis of adhesions, partial small bowel resection x2  7/25 Thrombectomy L femoral AV graft  7/26 Extubated, CVL is out  7/27 Transferred to telemetry under [facility]  8/2 Acute abdomen, dehiscence of anastomosis, SBO, back from OR intubated in ICU on PCCM service. Found to have moderate peritonitis infraumbilically  8/2 Extubated   Antibiotics: Zosyn 8/1 >>>  Micafungin 8/3 >>8/16  Invanz 7/24 periop  Fluconazole- 8/2 + 8/17 >>  Vancomycin 8/8 >>> 05/01/2012   HPI/Subjective: Patient states that he feels okay today. He plans to stay with his sister post discharge  Objective: Filed Vitals:   05/15/12 1143 05/15/12 1227 05/15/12 1330 05/15/12 1710  BP: 104/71 125/69 118/97 150/67  Pulse: 102 100 103 74  Temp:  96.7 F (35.9 C) 97.1 F (36.2 C) 97.9 F (36.6 C)  TempSrc:  Oral Oral Oral  Resp: 16 16 18 17   Height:      Weight:  51 kg (112 lb 7 oz)    SpO2:  98% 100% 99%   Weight change: 8.023 kg (17 lb 11 oz)  Intake/Output Summary (Last 24 hours) at 05/15/12 1923 Last data filed at 05/15/12 1300  Gross per 24 hour  Intake   1140 ml  Output   3097 ml  Net  -1957 ml    General: Patient is very cachectic appearance. He is status post dialysis but in no acute distress  HEENT: [place]/AT PEERL, EOMI Heart: Regular rate and rhythm, without murmurs, rubs, gallops.  Lungs: Clear to auscultation. Abdomen: Soft, nontender.  Neuro: No focal neurological deficits noted . Musculoskeletal: No warm swelling or erythema around joints, no spinal tenderness noted.   Data Reviewed: Basic Metabolic Panel:  Lab 05/15/12 0981 05/13/12 0845 05/12/12 0739 05/11/12 0720  NA 127* -- 134* 132*  K 3.5 -- 3.2* 3.3*  CL 91* -- 96 95*  CO2 19 -- 27 23  GLUCOSE 88 -- 215* 108*  BUN 82* -- 45* 49*  CREATININE 5.40* -- 3.61* 4.25*  CALCIUM 9.7 -- 9.3 9.5  MG -- 1.5 -- --  PHOS 2.3 2.7 -- 2.4   Liver Function Tests:  Lab  05/15/12 0900 05/11/12 0720  AST -- 20  ALT -- 18  ALKPHOS -- 105  BILITOT -- 1.7*  PROT -- 5.1*  ALBUMIN 1.5* 1.4*   No results found for this basename: LIPASE:5,AMYLASE:5 in the last 168 hours No results found for this basename: AMMONIA:5 in the last 168 hours CBC:  Lab 05/15/12 0900 05/14/12 0355 05/13/12 0817 05/11/12 0720 05/10/12 0530 05/10/12 0514  WBC 7.9 7.6 10.3 13.5* -- 21.6*  NEUTROABS -- -- -- -- 19.0* --  HGB 9.9* 10.0* 9.6* 10.6* -- 11.0*  HCT 29.4* 30.8* 29.0* 31.3* -- 33.4*  MCV 82.6 85.3 83.6 86.2 -- 84.6  PLT 103* 102* 99* 108* -- 126*   Cardiac Enzymes: No results found for this basename: CKTOTAL:5,CKMB:5,CKMBINDEX:5,TROPONINI:5 in the last 168 hours BNP (last 3 results) No results found for this basename: PROBNP:3 in the last 8760 hours CBG:  Lab 05/15/12 1610 05/15/12 1313 05/15/12 0401 05/15/12 0003 05/14/12 2006  GLUCAP 97 92 111* 148* 148*    No results found for this or any previous visit (from the past 240 hour(s)).   Studies: Ct Angio Chest Pe W/cm &/or Wo Cm  04/25/2012  *RADIOLOGY REPORT*  Clinical Data: Chest pain and cough.  CT ANGIOGRAPHY CHEST  Technique:  Multidetector CT imaging of the chest using the standard protocol during bolus administration of intravenous contrast. Multiplanar reconstructed images including MIPs were obtained and reviewed to evaluate the vascular anatomy.  Contrast: OMNIPAQUE IOHEXOL 350 MG/ML SOLN  Comparison: None.  Findings: Technically adequate study with good opacification of the central and segmental pulmonary arteries.  No focal filling defects.  No evidence of significant pulmonary embolus.  Normal heart size.  Normal caliber thoracic aorta without dissection. Aortic and coronary artery calcifications.  Enteric tube with tip below the level of the stomach, not visualized.  Small amount of mucous in the trachea.  Airways generally appear patent.  There is a small left pleural effusion with basilar atelectasis or  consolidation on the left.  Vague patchy infiltration in the left upper lung may represent edema or pneumonia.  No pneumothorax. There is pneumoperitoneum noted in the upper abdomen.  This likely results from recent surgery with noted laparotomy on 04/22/2012. Normal alignment of the thoracic spine.  IMPRESSION: No evidence of significant pulmonary embolus or aortic dissection. Small left pleural effusion with basilar atelectasis or consolidation.  Vague patchy infiltration in the left upper lung. Abdominal pneumoperitoneum consistent with recent surgery.  Original Report Authenticated By: Marlon Pel, M.D.   Ct Guided Abscess Drain  04/30/2012  *RADIOLOGY REPORT*  Indication: Post surgery for small bowel obstruction, now with anastomotic leak and large intra-abdominal abscess.  CT GUIDED RIGHT LOWER QUADRANT ABDOMINAL DRAINAGE CATHETER PLACEMENT  Comparison: CT abdomen pelvis - 04/29/2012  Medications: Fentanyl 75 mcg IV; Versed 1.5 mg IV  Total Moderate Sedation time: 20 minutes  Contrast: None  Complications: None immediate  Technique / Findings:  Informed written consent was obtained from the patient after a discussion of the risks, benefits and alternatives to treatment. The patient was placed supine on the CT gantry and a pre procedural CT was performed re-demonstrating the known abscess/fluid collection within the right lower abdominal quadrant.  The procedure was planned.   A timeout was performed prior to the initiation of the procedure.  The skin overlying the right lower abdomen was prepped and draped in the usual sterile fashion.   The overlying soft tissues were anesthetized with 1% lidocaine with epinephrine.  An 18 gauge trocar needle was advanced in to the abscess/fluid collection and a short Amplatz super stiff wire was coiled within the abscess/fluid collection.   Appropriate positioning was confirmed with a limited CT scan.  The tract was serially dilated allowing placement of a 10 [place]  all-purpose drainage catheter.  Appropriate positioning was confirmed with a limited postprocedural CT scan.  200 ml of bloody, slightly purulent fluid was aspirated.  The tube was connected to a drainage bag and sutured in place.  A dressing was placed.  The patient tolerated the procedure well without immediate post procedural complication.  Impression:  Successful CT guided placement of a 10 [place] all purpose drain catheter into the right lower abdomen with aspiration of 200 mL of bloody, slightly purulent fluid.  Samples were sent to the laboratory as requested by the ordering clinical team.  Original Report Authenticated By: Waynard Reeds, M.D.   Ct Abdomen Pelvis W Contrast  05/04/2012  *RADIOLOGY REPORT*  Clinical Data: Status post surgery to treat the small bowel obstruction.  A right lower quadrant abscess was treated with percutaneous drainage on 04/30/2012.  CT ABDOMEN AND PELVIS WITH CONTRAST  Technique:  Multidetector CT imaging of the abdomen and pelvis was performed following the standard protocol  during bolus administration of intravenous contrast.  Contrast:  75 ml Omnipaque-300 IV  Comparison: 04/30/2012 and 04/29/2012  Findings: Visualized lung bases show a left basilar pleural effusion and consolidation/atelectasis of the posterior left lower lobe.  There is extravasation of oral contrast from the small bowel into a residual abscess cavity within the right lower quadrant of the abdomen.  The percutaneous drain is located in the posterior/dependent aspect of the cavity and shows some surrounding debris which may be acting to occlude/partially occlude the catheter.  The abscess cavity remains of significant size, measuring over 10 cm in greatest dimensions.  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  Consider repositioning/upsizing of the current  percutaneous drain or placement of a second drain into the more anterior aspect of the residual collection.  No new abscess collections are identified. There is no evidence of gross free intraperitoneal air.  Solid organs of the abdomen are stable in appearance.  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 Indwelling percutaneous drain lies in the dependent portion of this collection which may contain debris obstructing the catheter. Given the large residual collection present, further drain manipulation including potential repositioning and upsizing of the current drain and also potentially placement of a second more anteriorly positioned drain may be helpful.  This will be communicated to the interventional Radiology service.  Original Report Authenticated By: Reola Calkins, M.D.   Ct Abdomen Pelvis W Contrast  04/29/2012  *RADIOLOGY REPORT*  Clinical Data: Post surgery for small bowel obstruction in an anastomotic leak.  Leukocytosis.  Dialysis patient.  CT ABDOMEN AND PELVIS WITH CONTRAST  Technique:  Multidetector CT imaging of the abdomen and pelvis was performed following the standard protocol during bolus administration of intravenous contrast.  Contrast: OMNIPAQUE IOHEXOL 300 MG/ML  SOLN  Comparison: 04/03/2012  Findings: New small left pleural effusion with basilar atelectasis or consolidation on the left.  Enteric tube with tip in the distal stomach.  The liver and spleen appear homogeneous without enlargement.  Portal and mesenteric vessels appear patent.  The gallbladder is moderately distended and is filled with increased density material suggesting vicarious contrast excretion. Pancreatic duct is  diffusely dilated as seen previously.  No obstructing mass or stone is visualized.  Consider MRCP for further evaluation.  Extensive calcification of the abdominal aorta, iliac vessels, and splenic artery.  Calcification of the mesenteric artery with flow demonstrated.  Left adrenal gland nodule measuring 14 mm diameter.  This appears stable since the previous study but density measures are indeterminate.  No significant retroperitoneal lymphadenopathy.  Bilateral renal atrophy with multiple bilateral renal cysts.  No hydronephrosis.  The stomach, small bowel, and colon are decompressed.  No residual small bowel obstruction. Interval postoperative changes with midline surgical defect along the anterior abdominal wall and skin clips in the left groin region.  There is interval development of a large gas and fluid collections in the right pelvis extending from anteriorly to posteriorly and extending up into the right lower quadrant.  This collection measures up to about 16.5 x 13.7 x 6.4 cm.  The collection  has a mildly thickened enhancing wall and air-fluid levels are present.  The appearance is consistent with loculated fluid or abscess.  There are additional loculated fluid collections in the anterior right lower quadrant measuring 3.3 x .9 centimeters and between the medial ascending colon and psoas muscle measuring 2.6 x 2.6 cm.  Sulci are consistent with abscesses.  Probably loculated fluid collection at the tip of the liver inferiorly. Infiltration into the mesenteric fat in the upper abdomen around the spleen and liver may represent edema.  No loculation is apparent.  Pelvis:  Pelvic transplant kidney with diffuse atrophy and prominent renal sinus fat.  No hydronephrosis.  Vascular calcification in the graft.  There  is a vascular graft extending from the common femoral vein inferiorly and from the common femoral artery laterally on the left.  Extensive calcification of the native femoral arteries bilaterally  with suggestion of significant stenosis of the right common femoral artery.  Edema in the subcutaneous soft tissues particularly on the left.  Normal alignment of the lumbar vertebra.  IMPRESSION: Interval postoperative changes.  No residual bowel obstruction. Multiple loculated fluid collections including a very large right abdominal and pelvic collection consistent with abscesses. Vicarious contrast excretion into the gallbladder.  New left pleural effusion and basilar atelectasis.  Additional incidental findings are stable since the previous study.  Results were telephoned to and Marchelle Folks, the patient's nurse on [facility]- [id number] at the time of dictation, 2129 hours on 04/29/2012.  Original Report Authenticated By: Marlon Pel, M.D.   Ir Catheter Tube Change  05/05/2012  *RADIOLOGY REPORT*  Clinical history:51 year old with multiple medical problems including a small bowel leak.  The patient has a right lower quadrant drain which is incompletely draining the large abscess collection.  PROCEDURE(S): EXCHANGE AND REPOSITIONING OF THE PERCUTANEOUS DRAINAGE CATHETER WITH FLUOROSCOPY  Physician: Rachelle Hora. Henn, MD  Medications:Versed 2 mg, Fentanyl 25 mcg. A radiology nurse monitored the patient for moderate sedation.  Moderate sedation time:22 minutes  Fluoroscopy time: 3.8 minutes  Procedure:Informed consent was obtained for a drain repositioning and exchange.  The patient was placed supine on the interventional table.  The existing catheter in the right lower quadrant was prepped and draped in a sterile fashion.  Maximal barrier sterile technique was utilized including caps, mask, sterile gowns, sterile gloves, sterile drape, hand hygiene and skin antiseptic.  Catheter was injected with contrast and the catheter was cut and removed over a Bentson wire.  A Kumpe catheter was advanced into the upper portion of the collection.  Position was confirmed with ultrasound. A 12-French biliary drain was advanced over a Bentson  wire and into the superior aspect the collection.  Greater than 200 ml of dark brown fluid was removed.  Catheter sutured to the skin and attached to a gravity bag.  Findings:The old catheter was located within the inferior posterior aspect of the abscess collection.  Adjacent to the collection, there is high density material which is probably related to the small bowel fistula or leak.  Catheter and wire were advanced into the anterior and superior aspect of the collection.  Biliary drain was also advanced into the superior aspect of the collection. Greater than 200 ml of dark brown fluid was removed.  Complications: None  Impression:Successful exchange and repositioning of the drainage catheter.  The large abscess was successfully decompressed following placement of the new catheter.  Original Report Authenticated By: Richarda Overlie, M.D.   Ir Fluoro Guide Cv Line Left  04/23/2012  *RADIOLOGY REPORT*  Clinical Data/Indication: BOWEL OBSTRUCTION.  CENTRAL VENOUS ACCESS REQUESTED.  IR LEFT FLOURO GUIDE CV LINE  Fluoroscopy Time: 2.5 minutes.  Procedure: The procedure, risks, benefits, and alternatives were explained to the patient. Questions regarding the procedure were encouraged and answered. The patient understands and consents to the procedure.  The right neck was prepped with betadine in a sterile fashion, and a sterile drape was applied covering the operative field. A sterile gown and sterile gloves were used for the procedure.  Sonographic imaging over the right neck demonstrate a collateral venous structures only without a viable large central venous structure.  Under sonographic guidance, a micropuncture needle was inserted into one of the collateral vessels and removed over a 0018 wire.  The wire could not be advanced centrally.  Central venous access from the right neck was aborted.  Subsequently, under sonographic guidance, a micropuncture needle was inserted into the left internal jugular vein and removed  over a 0.8 wire.  A 3-French dilator was inserted.  Central venography confirms central venous occlusion.  Findings: Central venous occlusion into the SVC is demonstrated. Center venous access was not placed.  Complications: None.  IMPRESSION: Central venous access was not placed.  Upper extremity and internal jugular vein center venous access is chronically occluded.  Original Report Authenticated By: Donavan Burnet, M.D.   Ir US Guide Vasc Access Left  04/23/2012  Please refer to accession number [id number].  Original Report Authenticated By: Donavan Burnet, M.D.   Dg Chest Port 1 View  05/05/2012  *RADIOLOGY REPORT*  Clinical Data: Chest pain.  PORTABLE CHEST - 1 VIEW  Comparison: 04/28/2012  Findings: Nasogastric tube extends at least as   the   stomach, tip not seen.  Left lower lung consolidation / atelectasis has partially improved since the previous exam.  Right lung remains clear.  Probable small left pleural effusion.  Regional bones unremarkable.  IMPRESSION:  1.  Slight improvement in the left lower lung consolidation / atelectasis, with persistent small effusion.  Original Report Authenticated By: Osa Craver, M.D.   Dg Chest Port 1 View  04/28/2012  *RADIOLOGY REPORT*  Clinical Data: Progressive leukocytosis.  PORTABLE CHEST - 1 VIEW  Comparison: Chest x-ray dated 04/23/2012 and chest CT dated 04/25/2012  Findings: There is a persistent moderate left effusion with compressive atelectasis in the left lower lobe.  Heart size and vascularity are normal.  Right lung is clear.  NG tube tip is below the diaphragm.  IMPRESSION: Progressive left pleural effusion and left lower lobe atelectasis.  Original Report Authenticated By: Gwynn Burly, M.D.   Dg Chest Port 1 View  04/23/2012  *RADIOLOGY REPORT*  Clinical Data: Endotracheal tube placed  PORTABLE CHEST - 1 VIEW  Comparison: 04/15/2012  Findings: Endotracheal tube tip 7.2 cm proximal to the carina. Interval removal of the left IJ  catheter.  Hyperinflated lungs. Mild retrocardiac opacity and left hemidiaphragm elevation.  No pneumothorax.  A enteric tube descends into the abdomen, tip not visualized.  There is nonspecific metallic density projecting over the left upper quadrant of the abdomen.  No acute osseous finding.  IMPRESSION: Endotracheal and enteric tubes as above.  Retrocardiac opacity; atelectasis, aspiration, or infiltrate.  Hyperinflation.  Original Report Authenticated By: Waneta Martins, M.D.   Dg Abd 2 Views  04/20/2012  *RADIOLOGY REPORT*  Clinical Data: Postop abdominal distention, recent of abdominal surgery for small bowel obstruction and lysis of the patient with partial small bowel resection  ABDOMEN - 2  VIEW  Comparison: Portable abdomen film of 04/16/2012  Findings: Both large and small bowel gas is present which may indicate postoperative ileus.  However there does appear be more distention of small bowel loops and a partial small bowel obstruction cannot be excluded.  Some contrast is scattered throughout the bowel.  Arterial calcifications are noted.  IMPRESSION: Distention of both large and small bowel may indicate ileus, but a partial small bowel obstruction cannot be excluded.  Original Report Authenticated By: Juline Patch, M.D.   Dg Abd Portable 1v  04/28/2012  *RADIOLOGY REPORT*  Clinical Data: Abdominal pain.  Ileus.  Progressive leukocytosis.  PORTABLE ABDOMEN - 1 VIEW  Comparison: Radiographs dated 04/20/2012  Findings: NG tube tip is in the body of the stomach.  There are no dilated loops of bowel.  Double-lumen catheter is in the right femoral vein with the tips in the inferior vena cava.  Contrast is seen in the gallbladder.  There is also scattered high density material in the bowel.  IMPRESSION: No acute abnormalities.  Original Report Authenticated By: Gwynn Burly, M.D.   Dg Abd Portable 1v  04/16/2012  *RADIOLOGY REPORT*  Clinical Data: No bowel activity since exploratory laparoscopy  2 days ago question ileus, right lower quadrant pain  PORTABLE ABDOMEN - 1 VIEW  Comparison: 04/13/2012  Findings: Nasogastric tube in stomach. Retained contrast in colon. Surgical clips in pelvis bilaterally. Scattered atherosclerotic calcifications. No bowel dilatation, bowel wall thickening, or evidence of obstruction. Bowel staple lines noted in right mid abdomen.  IMPRESSION: Nonobstructive bowel gas pattern. Resolution of small bowel distention since prior exam.  Original Report Authenticated By: Lollie Marrow, M.D.   Ir Shuntogram/ Fistulagram Left Mod Sed  05/10/2012  *RADIOLOGY REPORT*  Clinical Data: Left leg swelling.  Graft revision 2 weeks ago.  AV SHUNTOGRAM  Procedure:  The left thigh was prepped and draped in a sterile fashion.  An 18 gauge Angiocath was inserted into the AV graft. Contrast was injected.  The Angiocath was removed.  Hemostasis was achieved with direct pressure. No complication.  Findings: The arterial anastomosis and graft are widely patent. There is moderate narrowing at the venous anastomosis.  There is associated narrowing of the external iliac vein.  Central venous structures are otherwise patent  IMPRESSION: Venous anastomotic and external iliac vein narrowing.  Otherwise patent circuit.   Original Report Authenticated By: Donavan Burnet, M.D. ( 05/10/2012 13:05:15 )     Scheduled Meds:   . antiseptic oral rinse  15 mL Mouth Rinse q12n4p  . chlorhexidine  15 mL Mouth Rinse BID  . darbepoetin (ARANESP) injection - DIALYSIS  200 mcg Intravenous Q Sat-HD  . fentaNYL  37.5 mcg Transdermal Q72H  . fluconazole (DIFLUCAN) IV  400 mg Intravenous Q T,Th,Sa-HD  . heparin  40 Units/kg Dialysis Once in dialysis  . hydrALAZINE  10 mg Intravenous Once  . hydrocortisone sod succinate (SOLU-CORTEF) injection  25 mg Intravenous Daily  . insulin aspart  0-15 Units Subcutaneous Q4H  . lip balm   Topical BID  . metoprolol  5 mg Intravenous Q6H  . metoprolol  5 mg Intravenous Once   . pantoprazole  40 mg Oral Q1200  . piperacillin-tazobactam (ZOSYN)  IV  2.25 g Intravenous Q8H  . sodium chloride  10-40 mL Intracatheter Q12H  . DISCONTD: labetalol  20 mg Intravenous Once  . DISCONTD: pantoprazole (PROTONIX) IV  40 mg Intravenous Q24H   Continuous Infusions:   . sodium chloride 10 mL/hr  at 05/15/12 0600  . fat emulsion 250 mL (05/15/12 0600)  . TPN (CLINIMIX) +/- additives 70 mL/hr at 05/15/12 0600  . TPN (CLINIMIX) +/- additives 70 mL/hr at 05/15/12 1718    Principal Problem:  *SBO (small bowel obstruction) s/p EL/LOA and SBR x 2 Active Problems:  CKD (chronic kidney disease) stage V requiring chronic dialysis  Thrombocytopenia  Gout  HTN (hypertension)  Ileus following gastrointestinal surgery  Chronic use of steroids  Malnutrition, calorie  Leg graft occlusion  Acute respiratory failure with hypoxia  Sinus tachycardia  Abscess of abdominal cavity  Wound dehiscence  Bleeding  Anemia

## 2012-05-16 LAB — BASIC METABOLIC PANEL
BUN: 47 mg/dL — ABNORMAL HIGH (ref 6–23)
CO2: 25 mEq/L (ref 19–32)
Chloride: 92 mEq/L — ABNORMAL LOW (ref 96–112)
Glucose, Bld: 103 mg/dL — ABNORMAL HIGH (ref 70–99)
Potassium: 3.5 mEq/L (ref 3.5–5.1)

## 2012-05-16 LAB — GLUCOSE, CAPILLARY
Glucose-Capillary: 94 mg/dL (ref 70–99)
Glucose-Capillary: 103 mg/dL — ABNORMAL HIGH (ref 70–99)
Glucose-Capillary: 103 mg/dL — ABNORMAL HIGH (ref 70–99)

## 2012-05-16 MED ORDER — LIDOCAINE-PRILOCAINE 2.5-2.5 % EX CREA: 1.0000 "application " | TOPICAL_CREAM | CUTANEOUS | Status: DC | PRN

## 2012-05-16 MED ORDER — HEPARIN SODIUM (PORCINE) 1000 UNIT/ML DIALYSIS
1000.0000 [IU] | INTRAMUSCULAR | Status: DC | PRN
  Filled 2012-05-16: qty 1

## 2012-05-16 MED ORDER — ALTEPLASE 2 MG IJ SOLR: 2.0000 mg | Freq: Once | INTRAMUSCULAR | Status: AC | PRN

## 2012-05-16 MED ORDER — HEPARIN SODIUM (PORCINE) 1000 UNIT/ML DIALYSIS
40.0000 [IU]/kg | Freq: Once | INTRAMUSCULAR | Status: DC
  Filled 2012-05-16: qty 2

## 2012-05-16 MED ORDER — NEPRO/CARBSTEADY PO LIQD: 237.0000 mL | ORAL | Status: DC | PRN

## 2012-05-16 MED ORDER — SODIUM CHLORIDE 0.9 % IV SOLN: 100.0000 mL | INTRAVENOUS | Status: DC | PRN

## 2012-05-16 MED ORDER — INSULIN REGULAR HUMAN 100 UNIT/ML IJ SOLN
INTRAVENOUS | Status: AC
  Administered 2012-05-16: 18:00:00 via INTRAVENOUS
  Filled 2012-05-16: qty 2000

## 2012-05-16 MED ORDER — LIDOCAINE HCL (PF) 1 % IJ SOLN: 5.0000 mL | INTRAMUSCULAR | Status: DC | PRN

## 2012-05-16 MED ORDER — PENTAFLUOROPROP-TETRAFLUOROETH EX AERO: 1.0000 "application " | INHALATION_SPRAY | CUTANEOUS | Status: DC | PRN

## 2012-05-16 NOTE — Progress Notes (Signed)
PARENTERAL NUTRITION CONSULT NOTE - FOLLOW UP  Pharmacy Consult for TPN Indication: SBO with new EC fistula  Allergies  Allergen Reactions  . Allopurinol     REACTION: decreased platelets  . Aspirin     REACTION: unspecified    Patient Measurements: Height: 5\' 11"  (180.3 cm) Weight: 112 lb 7 oz (51 kg) IBW/kg (Calculated) : 75.3    Vital Signs: Temp: 98.1 F (36.7 C) (08/25 0502) Temp src: Oral (08/25 0502) BP: 150/67 mmHg (08/25 0502) Pulse Rate: 78  (08/25 0502) Intake/Output from previous day: 08/24 0701 - 08/25 0700 In: 2000 [IV Piggyback:250; TPN:1750] Out: 3222 [Drains:125] Intake/Output from this shift:    Labs:  Basename 05/15/12 0900 05/14/12 0355 05/13/12 1819 05/13/12 0817  WBC 7.9 7.6 -- 10.3  HGB 9.9* 10.0* -- 9.6*  HCT 29.4* 30.8* -- 29.0*  PLT 103* 102* -- 99*  APTT -- -- -- --  INR -- -- 1.17 --     Basename 05/16/12 0500 05/15/12 0900 05/13/12 0845  NA 129* 127* --  K 3.5 3.5 --  CL 92* 91* --  CO2 25 19 --  GLUCOSE 103* 88 --  BUN 47* 82* --  CREATININE 3.88* 5.40* --  LABCREA -- -- --  CREAT24HRUR -- -- --  CALCIUM 9.3 9.7 --  MG -- -- 1.5  PHOS -- 2.3 2.7  PROT -- -- --  ALBUMIN -- 1.5* --  AST -- -- --  ALT -- -- --  ALKPHOS -- -- --  BILITOT -- -- --  BILIDIR -- -- --  IBILI -- -- --  PREALBUMIN -- -- --  TRIG -- -- --  CHOLHDL -- -- --  CHOL -- -- --   Estimated Creatinine Clearance: 16.1 ml/min (by C-G formula based on Cr of 3.88).    Basename 05/16/12 0410 05/16/12 0013 05/15/12 2010  GLUCAP 94 122* 146*   Insulin Requirements in the past 24 hours:  6 units SSI + 20 units regular insulin in TNA.   Nutritional Goals:  1700-1950 kCal, 75-85 grams of protein per day   Current Nutrition:  Clinimix 5/15 at 70 ml/hr with 20% lipids at 10 ml/hr MWF provides an average of 1398 kcal and 84g protein per day  Admit: Admitted 7/13 with abd pain, weight loss, SBO. S/p LOA, SBR 7/24 and repeat LOA, SBR with anasomotic  leak repair 8/1, thrombectomy with AVG revision 7/25 and repeat thrombectomy 8/6. CT on 8/13 reveals enlarging abscess and enteric fistula.  GI: NGT out, wound drainage O/P sl up; tolerating TPN. Pt allowed to have ice chips and sips of water. Per RD note, pt is tolerating ice chips but is reluctant to pursue water/clears 2/2 fear of emesis or abdominal pain. Endo: CBGs well controlled, minimal use of SSI. Solu-Cortef daily.  Lytes: Na low, d/t overload and "dilute" TPN. K ok. Hypercalcemia (corrected Ca 11.7, Ca x PO4 < 55),  Renal: ESRD with HD typically TTS. HD is currently being administered prn d/t volume issues - received HD 8/19,20,22, 24. On Aranesp qSat.  Pulm: extubated on 8/1; stable on RA  Cards: BP mildly elevated (had hypotensive during HD). HR mostly controlled. C/o CP on 8/14 unrelieved with NTG x3 doses, troponin 1.76 -- thought to be related to demand ischemia. No further CP. On scheduled lopressor and prn hydralazine.  Hepatobil: LFTs WNL 8/20. Prealbumin was at goal (8/18). Trig WNL.  Neuro: GCS 15, no c/o pain - on fentanyl patch, prn pain meds.  ID: Fluconazole +  Zosyn for peritonitis/abd abscess -- ID following. Afebrile, WBC decreasing. Abd abscess culture: candida tropicalis.  Zosyn 8.1>>  Diflucan 8.16>> (s/p 14 d mycamine) Best Practices: Mouthcare, PPI-IV   Plan:  - Continue Clinimix 5/15 @ 70 ml/hr to avoid exacerbating volume status and patient to start PO's soon hopefully. MD / RD aware TPN not meeting goals with Clinimix alone.  - Provide available trace elements, MVI and lipids @10cc /hr on MWF only d/t national backorder  - Will f/up labs in AM - Continue Fluconazole 400 mg IV after each hemodialysis, Tuesday-Thursday-Saturday  - Continue Zosyn 2.25 grams IV q8 hrs.  Thanks, Ledon Weihe K. Allena Katz, PharmD, BCPS.  Clinical Pharmacist Pager [phone]. 05/16/2012 7:47 AM

## 2012-05-16 NOTE — Progress Notes (Signed)
Subjective: Interval History: none.  Objective: Vital signs in last 24 hours: Temp:  [96.7 F (35.9 C)-98.4 F (36.9 C)] 97.4 F (36.3 C) (08/25 0900) Pulse Rate:  [73-106] 73  (08/25 0900) Resp:  [16-18] 17  (08/25 0900) BP: (95-176)/(41-97) 148/61 mmHg (08/25 0900) SpO2:  [97 %-100 %] 97 % (08/25 0900) Weight:  [51 kg (112 lb 7 oz)] 51 kg (112 lb 7 oz) (08/24 1227) Weight change: -8.623 kg (-19 lb 0.2 oz)  Intake/Output from previous day: 08/24 0701 - 08/25 0700 In: 2000 [IV Piggyback:250; TPN:1750] Out: 3222 [Drains:125] Intake/Output this shift:    General appearance: alert, cooperative, cachectic and pale Resp: clear to auscultation bilaterally Cardio: regular rate and rhythm and systolic murmur: holosystolic 2/6, blowing at apex GI: pos bs, ostomy bag mid abdm over wournd.  liver down 4 cm Extremities: AVG L groin, iv on R groin  Lab Results:  Basename 05/15/12 0900 05/14/12 0355  WBC 7.9 7.6  HGB 9.9* 10.0*  HCT 29.4* 30.8*  PLT 103* 102*   BMET:  Basename 05/16/12 0500 05/15/12 0900  NA 129* 127*  K 3.5 3.5  CL 92* 91*  CO2 25 19  GLUCOSE 103* 88  BUN 47* 82*  CREATININE 3.88* 5.40*  CALCIUM 9.3 9.7   No results found for this basename: PTH:2 in the last 72 hours Iron Studies: No results found for this basename: IRON,TIBC,TRANSFERRIN,FERRITIN in the last 72 hours  Studies/Results: No results found.  I have reviewed the patient's current medications.  Assessment/Plan: 1 CRF vol xs, will do extra HD in am to catch up 2 Anemia stabel 3 SBO/dehisc/abscess  AB, ? reimage 4 malnutrition TNA 5 GERD ppi 6 GOUT P HD, epo, TnA, AB, ? reimage    LOS: 44 days   Amro Winebarger L 05/16/2012,9:40 AM

## 2012-05-16 NOTE — Progress Notes (Signed)
Patient ID: [name], male   DOB: 03-30-1961, 51 y.o.   MRN: [id number]   Subjective: Denies abdominal pain this am, had lots of gas in eakins pouch yesterday.   Objective: Vital signs in last 24 hours: Temp:  [96.7 F (35.9 C)-98.4 F (36.9 C)] 97.4 F (36.3 C) (08/25 0900) Pulse Rate:  [73-106] 73  (08/25 0900) Resp:  [16-18] 17  (08/25 0900) BP: (95-176)/(53-97) 148/61 mmHg (08/25 0900) SpO2:  [97 %-100 %] 97 % (08/25 0900) Weight:  [112 lb 7 oz (51 kg)] 112 lb 7 oz (51 kg) (08/24 1227) Last BM Date:  (unable to recall)  Intake/Output from previous day: 08/24 0701 - 08/25 0700 In: 2000 [IV Piggyback:250; TPN:1750] Out: 3222 [Drains:125] Intake/Output this shift:   Physical Exam: General: No acute Distress Abd: flat, soft, mildly tender, eakins pouch appears healthy with bilous/enteric looking output Perc drain on right with bloody enteric looking drainage. +bs  Lab Results:   Basename 05/15/12 0900 05/14/12 0355  WBC 7.9 7.6  HGB 9.9* 10.0*  HCT 29.4* 30.8*  PLT 103* 102*   BMET  Basename 05/16/12 0500 05/15/12 0900  NA 129* 127*  K 3.5 3.5  CL 92* 91*  CO2 25 19  GLUCOSE 103* 88  BUN 47* 82*  CREATININE 3.88* 5.40*  CALCIUM 9.3 9.7   PT/INR  Basename 05/13/12 1819  LABPROT 15.1  INR 1.17   ABG No results found for this basename: PHART:2,PCO2:2,PO2:2,HCO3:2 in the last 72 hours  Studies/Results: No results found.  Anti-infectives: Anti-infectives     Start     Dose/Rate Route Frequency Ordered Stop   05/08/12 1200   fluconazole (DIFLUCAN) IVPB 400 mg        400 mg 200 mL/hr over 60 Minutes Intravenous Every T-Th-Sa (Hemodialysis) 05/07/12 1752     04/30/12 1400   vancomycin (VANCOCIN) 750 mg in sodium chloride 0.9 % 150 mL IVPB  Status:  Discontinued        750 mg 150 mL/hr over 60 Minutes Intravenous  Once 04/30/12 1212 04/30/12 1507   04/29/12 2330   vancomycin (VANCOCIN) 1,500 mg in sodium chloride 0.9 % 500 mL IVPB        1,500  mg 250 mL/hr over 120 Minutes Intravenous  Once 04/29/12 2259 04/30/12 0246   04/27/12 0000   ceFAZolin (ANCEF) IVPB 1 g/50 mL premix  Status:  Discontinued     Comments: Send with pt to OR      1 g 100 mL/hr over 30 Minutes Intravenous On call 04/26/12 0927 04/26/12 0928   04/24/12 1000   micafungin (MYCAMINE) 100 mg in sodium chloride 0.9 % 100 mL IVPB  Status:  Discontinued        100 mg 100 mL/hr over 1 Hours Intravenous Daily 04/24/12 0750 05/07/12 1713   04/23/12 1600   fluconazole (DIFLUCAN) IVPB 400 mg        400 mg 200 mL/hr over 60 Minutes Intravenous  Once 04/23/12 1428 04/23/12 1800   04/22/12 2200   piperacillin-tazobactam (ZOSYN) IVPB 3.375 g  Status:  Discontinued        3.375 g 100 mL/hr over 30 Minutes Intravenous 3 times per day 04/22/12 2120 04/22/12 2137   04/22/12 2200   piperacillin-tazobactam (ZOSYN) IVPB 2.25 g        2.25 g 100 mL/hr over 30 Minutes Intravenous 3 times per day 04/22/12 2140     04/22/12 2157   gentamicin (GARAMYCIN) injection  Status:  Discontinued  As needed 04/22/12 2158 04/22/12 2335   04/22/12 2156   clindamycin (CLEOCIN) injection  Status:  Discontinued          As needed 04/22/12 2157 04/22/12 2335   04/22/12 2145   piperacillin-tazobactam (ZOSYN) IVPB 3.375 g        3.375 g 12.5 mL/hr over 240 Minutes Intravenous  Once 04/22/12 2141 04/23/12 0145   04/22/12 2130   gentamicin (GARAMYCIN) 240 mg, clindamycin (CLEOCIN) 900 mg in sodium chloride irrigation 0.9 % 1,000 mL irrigation  Status:  Discontinued         Irrigation  Once 04/22/12 2119 04/23/12 0020   04/14/12 0830   ertapenem (INVANZ) 1 g in sodium chloride 0.9 % 50 mL IVPB        1 g 100 mL/hr over 30 Minutes Intravenous To Surgery 04/14/12 0816 04/14/12 0834   04/13/12 1359   ertapenem (INVANZ) 1 g in sodium chloride 0.9 % 50 mL IVPB  Status:  Discontinued        1 g 100 mL/hr over 30 Minutes Intravenous 60 min pre-op 04/13/12 1359 04/14/12 0816           Assessment/Plan:  S/P SBR with EC fistula - continue Eakins pouch and IR drain.  Continue with sips of water and ice chips.  ID - Zosyn ESRD - per nephrology, s/p revision, Perm cath in place FEN - TNA  LOS: 44 days    [name] 05/16/2012

## 2012-05-16 NOTE — Progress Notes (Signed)
Drains with min bilious drainage.  Cont IV nutrition and npo.

## 2012-05-16 NOTE — Progress Notes (Signed)
19 Days Post-Op  Subjective: RLQ abscess drain placed 8/9; up sized 8/14 better  Objective: Vital signs in last 24 hours: Temp:  [96.7 F (35.9 C)-98.4 F (36.9 C)] 98.1 F (36.7 C) (08/25 0502) Pulse Rate:  [74-106] 78  (08/25 0502) Resp:  [16-18] 18  (08/25 0502) BP: (95-176)/(41-97) 150/67 mmHg (08/25 0502) SpO2:  [98 %-100 %] 100 % (08/25 0502) Weight:  [112 lb 7 oz (51 kg)] 112 lb 7 oz (51 kg) (08/24 1227) Last BM Date:  (unable to recall)  Intake/Output from previous day: 08/24 0701 - 08/25 0700 In: 2000 [IV Piggyback:250; TPN:1750] Out: 3222 [Drains:125] Intake/Output this shift:    PE:  Afeb; vss Output blood from drain: 70 cc yesterday; 20 cc in bag now - flushes well H/H stable Wbc wnl Site clean and dry   Lab Results:   [facility] 05/15/12 0900 05/14/12 0355  WBC 7.9 7.6  HGB 9.9* 10.0*  HCT 29.4* 30.8*  PLT 103* 102*   BMET  Basename 05/16/12 0500 05/15/12 0900  NA 129* 127*  K 3.5 3.5  CL 92* 91*  CO2 25 19  GLUCOSE 103* 88  BUN 47* 82*  CREATININE 3.88* 5.40*  CALCIUM 9.3 9.7   PT/INR  Basename 05/13/12 1819  LABPROT 15.1  INR 1.17   ABG No results found for this basename: PHART:2,PCO2:2,PO2:2,HCO3:2 in the last 72 hours  Studies/Results: No results found.  Anti-infectives:   Assessment/Plan: s/p Procedure(s) (LRB): THROMBECTOMY ARTERIOVENOUS GORE-TEX GRAFT (Left) REVISION OF ARTERIOVENOUS GORETEX GRAFT (Left)  Output blood Rec: re CT maybe today or tomorrow to recheck collection and position Discussed with Dr Lowella Dandy Plan per [facility]  [facility] A 05/16/2012

## 2012-05-16 NOTE — Progress Notes (Signed)
[facility] PROGRESS NOTE  [name] RUE:[id number] DOB: August 06, 1961 DOA: 04/02/2012 PCP: Trevor Iha, MD  Assessment/Plan: Principal Problem: SBO (small bowel obstruction) s/p exploratory laparotomy and lysis of adhesions and partial small bowel x 2 on 7/24 *Subsequent anastomotic leak- omental patch of anastomosis 8/2  *CT abdomen and pelvis 04/29/2012 demonstrated several fluid collections consistent with intra-abdominal abscess with the largest measuring 16.5 x 13.7 x 6.4 cm.  *Patient underwent percutaneous drainage of largest fluid collection 04/30/2012  *Repeat CT done for increasing WBC count on 8/13 revealed enlarging abscess and enteric fistula and obstructed drainage tube. 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  *An attempt was made on 04/21/2012 to place a central line in interventional radiology under fluoroscopy but unfortunately central venous access could not be placed due to upper extremity and internal jugular vein access being chronically occluded bilaterally therefore rationale for why we have to continue to use this patient's right femoral Vas-Cath port for parenteral nutrition   Chronic nonocclusive left lower extremity DVT  *Diagnosed via Doppler 04/30/2012 - he continues with left lower extermity edema - f/u doppler is negative   CKD (chronic kidney disease) stage V requiring chronic dialysis  *Dialysis per  nephrology    HTN (hypertension) *Adequate control. I will continue current meds.  Anemia *Stable. Due to acute blood loss and due to chronic disease - see above regarding wound bleeding  *Has received a total of 4 units of packed red blood cells this admission  *Cont aranesp and ferric gluconate   Chronic use of steroids *Hydrocortisone now down to 25 mg daily. I will continue until patient is able to resume his oral steroids.   Leg graft occlusion **s/p thrombectomy 7/25 - unfortunately re clotted - has undergone an additional thrombectomy procedure with revision of arterial and graft on 04/27/2012  *Was being dialyzed via HD cath in right groin but now graft is being used again  *See discussion per vascular surgery concerning narrowing at the venous anastomosis  Severe protein calorie malnutrition *Patient cachectic at presentation  *Parenteral nutrition managed by surgery and pharmacy   Code Status: Full Family Communication: Not applicable Disposition Plan: Not yet determined  Pattye Meda A.  [facility] Pager [phone]. If 8PM-8AM, please contact night-coverage at [email or web address], password [facility] 05/16/2012, 7:05 PM  LOS: 44 days   Brief narrative: This is a 51 year old male with past medical history of end-stage renal disease which dialyzes Monday Wednesdays and Fridays, who had a renal transplant about 17 years ago, prostatectomy in 2012, comes in for abdominal pain nausea or vomiting. He relates one day prior to admission he started having abdominal pain which progressively got worse it was mainly periumbilical nothing made it better or worse had no radiation. But in the middle of the night he took some TUMS and the pain did not improve. After this he started vomiting about 3 or 4 times. So he decided to come here to the emergency room. Here in the emergency room his pain was relieved by narcotics. A CT scan of  the abdomen and pelvis was done that shows small bowel  obstruction we were asked to mid and further evaluate.   Consultants: General surgery Nephrology Infectious disease the  Procedures: 7/24 Ex-lap, lysis of adhesions, partial small bowel resection x2  7/25 Thrombectomy L femoral AV graft  7/26 Extubated, CVL is out  7/27 Transferred to telemetry under [facility]  8/2 Acute abdomen, dehiscence of anastomosis, SBO, back from OR intubated in ICU on PCCM service. Found to have moderate peritonitis infraumbilically  8/2 Extubated   Antibiotics: Zosyn 8/1 >>>  Micafungin 8/3 >>8/16  Invanz 7/24 periop  Fluconazole- 8/2 + 8/17 >>  Vancomycin 8/8 >>> 05/01/2012   HPI/Subjective: Patient states that he feels okay today. Patient expresses frustration at not being able to drink any liquids  Objective: Filed Vitals:   05/16/12 0502 05/16/12 0900 05/16/12 1400 05/16/12 1632  BP: 150/67 148/61 160/75 165/79  Pulse: 78 73 76 75  Temp: 98.1 F (36.7 C) 97.4 F (36.3 C) 98.2 F (36.8 C) 97.9 F (36.6 C)  TempSrc: Oral Oral Oral Oral  Resp: 18 17 18 18   Height:      Weight:      SpO2: 100% 97% 99% 99%   Weight change: -8.623 kg (-19 lb 0.2 oz)  Intake/Output Summary (Last 24 hours) at 05/16/12 1905 Last data filed at 05/16/12 1901  Gross per 24 hour  Intake   1029 ml  Output    200 ml  Net    829 ml    General: Patient is very cachectic appearance. HEENT: [place]/AT PEERL, EOMI Heart: Regular rate and rhythm, without murmurs, rubs, gallops.  Lungs: Clear to auscultation. Abdomen: Soft, very mild tenderness, positive bowel sounds nondistended. Pouch with bilious appearing output. Percutaneous drain draining well. Neuro: No focal neurological deficits noted . Musculoskeletal: No warm swelling or erythema around joints, no spinal tenderness noted.   Data Reviewed: Basic Metabolic Panel:  Lab 05/16/12 1610 05/15/12 0900 05/13/12 0845 05/12/12 0739 05/11/12 0720  NA 129* 127* -- 134* 132*  K 3.5 3.5 -- 3.2* 3.3*  CL 92* 91* -- 96 95*   CO2 25 19 -- 27 23  GLUCOSE 103* 88 -- 215* 108*  BUN 47* 82* -- 45* 49*  CREATININE 3.88* 5.40* -- 3.61* 4.25*  CALCIUM 9.3 9.7 -- 9.3 9.5  MG -- -- 1.5 -- --  PHOS -- 2.3 2.7 -- 2.4   Liver Function Tests:  Lab 05/15/12 0900 05/11/12 0720  AST -- 20  ALT -- 18  ALKPHOS -- 105  BILITOT -- 1.7*  PROT -- 5.1*  ALBUMIN 1.5* 1.4*   No results found for this basename: LIPASE:5,AMYLASE:5 in the last 168 hours No results found for this basename: AMMONIA:5 in the last 168 hours CBC:  Lab 05/15/12 0900 05/14/12 0355 05/13/12 0817 05/11/12 0720 05/10/12 0530 05/10/12 0514  WBC 7.9 7.6 10.3 13.5* -- 21.6*  NEUTROABS -- -- -- -- 19.0* --  HGB 9.9* 10.0* 9.6* 10.6* -- 11.0*  HCT 29.4* 30.8* 29.0* 31.3* -- 33.4*  MCV 82.6 85.3 83.6 86.2 -- 84.6  PLT 103* 102* 99* 108* -- 126*   Cardiac Enzymes: No results found for this basename: CKTOTAL:5,CKMB:5,CKMBINDEX:5,TROPONINI:5 in the last 168 hours BNP (last 3 results) No results found for this basename: PROBNP:3 in the last 8760 hours CBG:  Lab 05/16/12 1623 05/16/12 1205 05/16/12 0803 05/16/12 0410 05/16/12 0013  GLUCAP 104* 103* 103* 94 122*    No results found for this or any previous visit (from  the past 240 hour(s)).   Studies: Ct Angio Chest Pe W/cm &/or Wo Cm  04/25/2012  *RADIOLOGY REPORT*  Clinical Data: Chest pain and cough.  CT ANGIOGRAPHY CHEST  Technique:  Multidetector CT imaging of the chest using the standard protocol during bolus administration of intravenous contrast. Multiplanar reconstructed images including MIPs were obtained and reviewed to evaluate the vascular anatomy.  Contrast: OMNIPAQUE IOHEXOL 350 MG/ML SOLN  Comparison: None.  Findings: Technically adequate study with good opacification of the central and segmental pulmonary arteries.  No focal filling defects.  No evidence of significant pulmonary embolus.  Normal heart size.  Normal caliber thoracic aorta without dissection. Aortic and coronary artery  calcifications.  Enteric tube with tip below the level of the stomach, not visualized.  Small amount of mucous in the trachea.  Airways generally appear patent.  There is a small left pleural effusion with basilar atelectasis or consolidation on the left.  Vague patchy infiltration in the left upper lung may represent edema or pneumonia.  No pneumothorax. There is pneumoperitoneum noted in the upper abdomen.  This likely results from recent surgery with noted laparotomy on 04/22/2012. Normal alignment of the thoracic spine.  IMPRESSION: No evidence of significant pulmonary embolus or aortic dissection. Small left pleural effusion with basilar atelectasis or consolidation.  Vague patchy infiltration in the left upper lung. Abdominal pneumoperitoneum consistent with recent surgery.  Original Report Authenticated By: Marlon Pel, M.D.   Ct Guided Abscess Drain  04/30/2012  *RADIOLOGY REPORT*  Indication: Post surgery for small bowel obstruction, now with anastomotic leak and large intra-abdominal abscess.  CT GUIDED RIGHT LOWER QUADRANT ABDOMINAL DRAINAGE CATHETER PLACEMENT  Comparison: CT abdomen pelvis - 04/29/2012  Medications: Fentanyl 75 mcg IV; Versed 1.5 mg IV  Total Moderate Sedation time: 20 minutes  Contrast: None  Complications: None immediate  Technique / Findings:  Informed written consent was obtained from the patient after a discussion of the risks, benefits and alternatives to treatment. The patient was placed supine on the CT gantry and a pre procedural CT was performed re-demonstrating the known abscess/fluid collection within the right lower abdominal quadrant.  The procedure was planned.   A timeout was performed prior to the initiation of the procedure.  The skin overlying the right lower abdomen was prepped and draped in the usual sterile fashion.   The overlying soft tissues were anesthetized with 1% lidocaine with epinephrine.  An 18 gauge trocar needle was advanced in to the  abscess/fluid collection and a short Amplatz super stiff wire was coiled within the abscess/fluid collection.   Appropriate positioning was confirmed with a limited CT scan.  The tract was serially dilated allowing placement of a 10 [place] all-purpose drainage catheter.  Appropriate positioning was confirmed with a limited postprocedural CT scan.  200 ml of bloody, slightly purulent fluid was aspirated.  The tube was connected to a drainage bag and sutured in place.  A dressing was placed.  The patient tolerated the procedure well without immediate post procedural complication.  Impression:  Successful CT guided placement of a 10 [place] all purpose drain catheter into the right lower abdomen with aspiration of 200 mL of bloody, slightly purulent fluid.  Samples were sent to the laboratory as requested by the ordering clinical team.  Original Report Authenticated By: Waynard Reeds, M.D.   Ct Abdomen Pelvis W Contrast  05/04/2012  *RADIOLOGY REPORT*  Clinical Data: Status post surgery to treat the small bowel obstruction.  A right  lower quadrant abscess was treated with percutaneous drainage on 04/30/2012.  CT ABDOMEN AND PELVIS WITH CONTRAST  Technique:  Multidetector CT imaging of the abdomen and pelvis was performed following the standard protocol during bolus administration of intravenous contrast.  Contrast:  75 ml Omnipaque-300 IV  Comparison: 04/30/2012 and 04/29/2012  Findings: Visualized lung bases show a left basilar pleural effusion and consolidation/atelectasis of the posterior left lower lobe.  There is extravasation of oral contrast from the small bowel into a residual abscess cavity within the right lower quadrant of the abdomen.  The percutaneous drain is located in the posterior/dependent aspect of the cavity and shows some surrounding debris which may be acting to occlude/partially occlude the catheter.  The abscess cavity remains of significant size, measuring over 10 cm in greatest dimensions.   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  Consider repositioning/upsizing of the current percutaneous drain or placement of a second drain into the more anterior aspect of the residual collection.  No new abscess collections are identified. There is no evidence of gross free intraperitoneal air.  Solid organs of the abdomen are stable in appearance.  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 Indwelling percutaneous drain lies in the dependent portion of this collection which may contain debris obstructing the catheter. Given the large residual collection present, further drain manipulation including potential repositioning and upsizing of the current drain and also potentially placement of a second more anteriorly positioned drain may be helpful.  This will be communicated to the interventional Radiology service.  Original Report Authenticated By: Reola Calkins, M.D.   Ct Abdomen Pelvis W Contrast  04/29/2012  *RADIOLOGY REPORT*  Clinical Data: Post surgery for small bowel obstruction in an anastomotic leak.  Leukocytosis.  Dialysis patient.  CT ABDOMEN AND PELVIS WITH CONTRAST  Technique:  Multidetector CT imaging of the abdomen and pelvis was performed following the standard protocol during bolus administration of intravenous contrast.  Contrast: OMNIPAQUE IOHEXOL 300 MG/ML  SOLN  Comparison: 04/03/2012  Findings: New small left pleural effusion with basilar atelectasis or consolidation on the left.  Enteric tube with tip in the distal stomach.  The liver and spleen  appear homogeneous without enlargement.  Portal and mesenteric vessels appear patent.  The gallbladder is moderately distended and is filled with increased density material suggesting vicarious contrast excretion. Pancreatic duct is diffusely dilated as seen previously.  No obstructing mass or stone is visualized.  Consider MRCP for further evaluation.  Extensive calcification of the abdominal aorta, iliac vessels, and splenic artery.  Calcification of the mesenteric artery with flow demonstrated.  Left adrenal gland nodule measuring 14 mm diameter.  This appears stable since the previous study but density measures are indeterminate.  No significant retroperitoneal lymphadenopathy.  Bilateral renal atrophy with multiple bilateral renal cysts.  No hydronephrosis.  The stomach, small bowel, and colon are decompressed.  No residual small bowel obstruction. Interval postoperative changes with midline surgical defect along the anterior abdominal wall and skin clips in the left groin region.  There is interval development of a large gas and fluid  collections in the right pelvis extending from anteriorly to posteriorly and extending up into the right lower quadrant.  This collection measures up to about 16.5 x 13.7 x 6.4 cm.  The collection has a mildly thickened enhancing wall and air-fluid levels are present.  The appearance is consistent with loculated fluid or abscess.  There are additional loculated fluid collections in the anterior right lower quadrant measuring 3.3 x .9 centimeters and between the medial ascending colon and psoas muscle measuring 2.6 x 2.6 cm.  Sulci are consistent with abscesses.  Probably loculated fluid collection at the tip of the liver inferiorly. Infiltration into the mesenteric fat in the upper abdomen around the spleen and liver may represent edema.  No loculation is apparent.  Pelvis:  Pelvic transplant kidney with diffuse atrophy and prominent renal sinus fat.  No hydronephrosis.  Vascular  calcification in the graft.  There  is a vascular graft extending from the common femoral vein inferiorly and from the common femoral artery laterally on the left.  Extensive calcification of the native femoral arteries bilaterally with suggestion of significant stenosis of the right common femoral artery.  Edema in the subcutaneous soft tissues particularly on the left.  Normal alignment of the lumbar vertebra.  IMPRESSION: Interval postoperative changes.  No residual bowel obstruction. Multiple loculated fluid collections including a very large right abdominal and pelvic collection consistent with abscesses. Vicarious contrast excretion into the gallbladder.  New left pleural effusion and basilar atelectasis.  Additional incidental findings are stable since the previous study.  Results were telephoned to and Marchelle Folks, the patient's nurse on [facility]- [id number] at the time of dictation, 2129 hours on 04/29/2012.  Original Report Authenticated By: Marlon Pel, M.D.   Ir Catheter Tube Change  05/05/2012  *RADIOLOGY REPORT*  Clinical history:51 year old with multiple medical problems including a small bowel leak.  The patient has a right lower quadrant drain which is incompletely draining the large abscess collection.  PROCEDURE(S): EXCHANGE AND REPOSITIONING OF THE PERCUTANEOUS DRAINAGE CATHETER WITH FLUOROSCOPY  Physician: Rachelle Hora. Henn, MD  Medications:Versed 2 mg, Fentanyl 25 mcg. A radiology nurse monitored the patient for moderate sedation.  Moderate sedation time:22 minutes  Fluoroscopy time: 3.8 minutes  Procedure:Informed consent was obtained for a drain repositioning and exchange.  The patient was placed supine on the interventional table.  The existing catheter in the right lower quadrant was prepped and draped in a sterile fashion.  Maximal barrier sterile technique was utilized including caps, mask, sterile gowns, sterile gloves, sterile drape, hand hygiene and skin antiseptic.  Catheter was injected with  contrast and the catheter was cut and removed over a Bentson wire.  A Kumpe catheter was advanced into the upper portion of the collection.  Position was confirmed with ultrasound. A 12-French biliary drain was advanced over a Bentson wire and into the superior aspect the collection.  Greater than 200 ml of dark brown fluid was removed.  Catheter sutured to the skin and attached to a gravity bag.  Findings:The old catheter was located within the inferior posterior aspect of the abscess collection.  Adjacent to the collection, there is high density material which is probably related to the small bowel fistula or leak.  Catheter and wire were advanced into the anterior and superior aspect of the collection.  Biliary drain was also advanced into the superior aspect of the collection. Greater than 200 ml of dark brown fluid was removed.  Complications: None  Impression:Successful exchange and repositioning of the drainage catheter.  The large abscess was successfully decompressed following placement of the new catheter.  Original Report Authenticated By: Richarda Overlie, M.D.   Ir Fluoro Guide Cv Line Left  04/23/2012  *RADIOLOGY REPORT*  Clinical Data/Indication: BOWEL OBSTRUCTION.  CENTRAL VENOUS ACCESS REQUESTED.  IR LEFT FLOURO GUIDE CV LINE  Fluoroscopy Time: 2.5 minutes.  Procedure: The procedure, risks, benefits, and alternatives were explained to the patient. Questions regarding the procedure were encouraged and answered. The patient understands and consents to the procedure.  The right neck was prepped with betadine in a sterile fashion, and a sterile drape was applied covering the operative field. A sterile gown and sterile gloves were used for the procedure.  Sonographic imaging over the right neck demonstrate a collateral venous structures only without a viable large central venous structure.  Under sonographic guidance, a micropuncture needle was inserted into one of the collateral vessels and removed over a  0018 wire.  The wire could not be advanced centrally.  Central venous access from the right neck was aborted.  Subsequently, under sonographic guidance, a micropuncture needle was inserted into the left internal jugular vein and removed over a 0.8 wire.  A 3-French dilator was inserted.  Central venography confirms central venous occlusion.  Findings: Central venous occlusion into the SVC is demonstrated. Center venous access was not placed.  Complications: None.  IMPRESSION: Central venous access was not placed.  Upper extremity and internal jugular vein center venous access is chronically occluded.  Original Report Authenticated By: Donavan Burnet, M.D.   Ir US Guide Vasc Access Left  04/23/2012  Please refer to accession number [id number].  Original Report Authenticated By: Donavan Burnet, M.D.   Dg Chest Port 1 View  05/05/2012  *RADIOLOGY REPORT*  Clinical Data: Chest pain.  PORTABLE CHEST - 1 VIEW  Comparison: 04/28/2012  Findings: Nasogastric tube extends at least as   the   stomach, tip not seen.  Left lower lung consolidation / atelectasis has partially improved since the previous exam.  Right lung remains clear.  Probable small left pleural effusion.  Regional bones unremarkable.  IMPRESSION:  1.  Slight improvement in the left lower lung consolidation / atelectasis, with persistent small effusion.  Original Report Authenticated By: Osa Craver, M.D.   Dg Chest Port 1 View  04/28/2012  *RADIOLOGY REPORT*  Clinical Data: Progressive leukocytosis.  PORTABLE CHEST - 1 VIEW  Comparison: Chest x-ray dated 04/23/2012 and chest CT dated 04/25/2012  Findings: There is a persistent moderate left effusion with compressive atelectasis in the left lower lobe.  Heart size and vascularity are normal.  Right lung is clear.  NG tube tip is below the diaphragm.  IMPRESSION: Progressive left pleural effusion and left lower lobe atelectasis.  Original Report Authenticated By: Gwynn Burly, M.D.   Dg  Chest Port 1 View  04/23/2012  *RADIOLOGY REPORT*  Clinical Data: Endotracheal tube placed  PORTABLE CHEST - 1 VIEW  Comparison: 04/15/2012  Findings: Endotracheal tube tip 7.2 cm proximal to the carina. Interval removal of the left IJ catheter.  Hyperinflated lungs. Mild retrocardiac opacity and left hemidiaphragm elevation.  No pneumothorax.  A enteric tube descends into the abdomen, tip not visualized.  There is nonspecific metallic density projecting over the left upper quadrant of the abdomen.  No acute osseous finding.  IMPRESSION: Endotracheal and enteric tubes as above.  Retrocardiac opacity; atelectasis, aspiration, or infiltrate.  Hyperinflation.  Original Report Authenticated By: Waneta Martins, M.D.   Dg Abd 2  Views  04/20/2012  *RADIOLOGY REPORT*  Clinical Data: Postop abdominal distention, recent of abdominal surgery for small bowel obstruction and lysis of the patient with partial small bowel resection  ABDOMEN - 2 VIEW  Comparison: Portable abdomen film of 04/16/2012  Findings: Both large and small bowel gas is present which may indicate postoperative ileus.  However there does appear be more distention of small bowel loops and a partial small bowel obstruction cannot be excluded.  Some contrast is scattered throughout the bowel.  Arterial calcifications are noted.  IMPRESSION: Distention of both large and small bowel may indicate ileus, but a partial small bowel obstruction cannot be excluded.  Original Report Authenticated By: Juline Patch, M.D.   Dg Abd Portable 1v  04/28/2012  *RADIOLOGY REPORT*  Clinical Data: Abdominal pain.  Ileus.  Progressive leukocytosis.  PORTABLE ABDOMEN - 1 VIEW  Comparison: Radiographs dated 04/20/2012  Findings: NG tube tip is in the body of the stomach.  There are no dilated loops of bowel.  Double-lumen catheter is in the right femoral vein with the tips in the inferior vena cava.  Contrast is seen in the gallbladder.  There is also scattered high density  material in the bowel.  IMPRESSION: No acute abnormalities.  Original Report Authenticated By: Gwynn Burly, M.D.   Dg Abd Portable 1v  04/16/2012  *RADIOLOGY REPORT*  Clinical Data: No bowel activity since exploratory laparoscopy 2 days ago question ileus, right lower quadrant pain  PORTABLE ABDOMEN - 1 VIEW  Comparison: 04/13/2012  Findings: Nasogastric tube in stomach. Retained contrast in colon. Surgical clips in pelvis bilaterally. Scattered atherosclerotic calcifications. No bowel dilatation, bowel wall thickening, or evidence of obstruction. Bowel staple lines noted in right mid abdomen.  IMPRESSION: Nonobstructive bowel gas pattern. Resolution of small bowel distention since prior exam.  Original Report Authenticated By: Lollie Marrow, M.D.   Ir Shuntogram/ Fistulagram Left Mod Sed  05/10/2012  *RADIOLOGY REPORT*  Clinical Data: Left leg swelling.  Graft revision 2 weeks ago.  AV SHUNTOGRAM  Procedure:  The left thigh was prepped and draped in a sterile fashion.  An 18 gauge Angiocath was inserted into the AV graft. Contrast was injected.  The Angiocath was removed.  Hemostasis was achieved with direct pressure. No complication.  Findings: The arterial anastomosis and graft are widely patent. There is moderate narrowing at the venous anastomosis.  There is associated narrowing of the external iliac vein.  Central venous structures are otherwise patent  IMPRESSION: Venous anastomotic and external iliac vein narrowing.  Otherwise patent circuit.   Original Report Authenticated By: Donavan Burnet, M.D. ( 05/10/2012 13:05:15 )     Scheduled Meds:    . antiseptic oral rinse  15 mL Mouth Rinse q12n4p  . chlorhexidine  15 mL Mouth Rinse BID  . darbepoetin (ARANESP) injection - DIALYSIS  200 mcg Intravenous Q Sat-HD  . fentaNYL  37.5 mcg Transdermal Q72H  . fluconazole (DIFLUCAN) IV  400 mg Intravenous Q T,Th,Sa-HD  . heparin  40 Units/kg Dialysis Once in dialysis  . hydrocortisone sod  succinate (SOLU-CORTEF) injection  25 mg Intravenous Daily  . insulin aspart  0-15 Units Subcutaneous Q4H  . lip balm   Topical BID  . metoprolol  5 mg Intravenous Q6H  . pantoprazole  40 mg Oral Q1200  . piperacillin-tazobactam (ZOSYN)  IV  2.25 g Intravenous Q8H  . sodium chloride  10-40 mL Intracatheter Q12H   Continuous Infusions:    . TPN (CLINIMIX) +/- additives  70 mL/hr at 05/15/12 1718  . TPN (CLINIMIX) +/- additives 70 mL/hr at 05/16/12 1742  . DISCONTD: sodium chloride 10 mL/hr at 05/15/12 0600    Principal Problem:  *SBO (small bowel obstruction) s/p EL/LOA and SBR x 2 Active Problems:  CKD (chronic kidney disease) stage V requiring chronic dialysis  Thrombocytopenia  Gout  HTN (hypertension)  Ileus following gastrointestinal surgery  Chronic use of steroids  Malnutrition, calorie  Leg graft occlusion  Acute respiratory failure with hypoxia  Sinus tachycardia  Abscess of abdominal cavity  Wound dehiscence  Bleeding  Anemia

## 2012-05-17 ENCOUNTER — Inpatient Hospital Stay (HOSPITAL_COMMUNITY): Payer: Medicare Other

## 2012-05-17 DIAGNOSIS — D649 Anemia, unspecified: Secondary | ICD-10-CM

## 2012-05-17 LAB — PREALBUMIN: Prealbumin: 23.4 mg/dL (ref 17.0–34.0)

## 2012-05-17 LAB — COMPREHENSIVE METABOLIC PANEL
ALT: 26 U/L (ref 0–53)
AST: 32 U/L (ref 0–37)
Albumin: 1.4 g/dL — ABNORMAL LOW (ref 3.5–5.2)
CO2: 21 mEq/L (ref 19–32)
Calcium: 9.5 mg/dL (ref 8.4–10.5)
Chloride: 89 mEq/L — ABNORMAL LOW (ref 96–112)
GFR calc non Af Amer: 10 mL/min — ABNORMAL LOW (ref >90)
Sodium: 126 mEq/L — ABNORMAL LOW (ref 135–145)
Total Bilirubin: 2.1 mg/dL — ABNORMAL HIGH (ref 0.3–1.2)

## 2012-05-17 LAB — CBC
HCT: 31.2 % — ABNORMAL LOW (ref 39.0–52.0)
RDW: 18.2 % — ABNORMAL HIGH (ref 11.5–15.5)
WBC: 6.2 10*3/uL (ref 4.0–10.5)

## 2012-05-17 LAB — DIFFERENTIAL
Lymphocytes Relative: 12 % (ref 12–46)
Lymphs Abs: 0.7 10*3/uL (ref 0.7–4.0)
Neutrophils Relative %: 69 % (ref 43–77)

## 2012-05-17 LAB — GLUCOSE, CAPILLARY
Glucose-Capillary: 123 mg/dL — ABNORMAL HIGH (ref 70–99)
Glucose-Capillary: 78 mg/dL (ref 70–99)
Glucose-Capillary: 91 mg/dL (ref 70–99)
Glucose-Capillary: 97 mg/dL (ref 70–99)

## 2012-05-17 LAB — CHOLESTEROL, TOTAL: Cholesterol: 138 mg/dL (ref 0–200)

## 2012-05-17 MED ORDER — ZINC TRACE METAL 1 MG/ML IV SOLN
INTRAVENOUS | Status: AC
  Administered 2012-05-17: 18:00:00 via INTRAVENOUS
  Filled 2012-05-17: qty 2000

## 2012-05-17 MED ORDER — MORPHINE SULFATE 2 MG/ML IJ SOLN
INTRAMUSCULAR | Status: AC
  Administered 2012-05-17: 2 mg via INTRAVENOUS
  Filled 2012-05-17: qty 1

## 2012-05-17 MED ORDER — FAT EMULSION 20 % IV EMUL
240.0000 mL | INTRAVENOUS | Status: AC
  Administered 2012-05-17: 240 mL via INTRAVENOUS
  Filled 2012-05-17: qty 250

## 2012-05-17 MED ORDER — FLUCONAZOLE IN SODIUM CHLORIDE 200-0.9 MG/100ML-% IV SOLN
200.0000 mg | Freq: Once | INTRAVENOUS | Status: AC
  Administered 2012-05-17: 200 mg via INTRAVENOUS
  Filled 2012-05-17: qty 100

## 2012-05-17 NOTE — Progress Notes (Signed)
[facility] PROGRESS NOTE  [name] JWJ:[id number] DOB: [birth year] DOA: 04/02/2012 PCP: Trevor Iha, MD  Assessment/Plan: Principal Problem: SBO (small bowel obstruction) s/p exploratory laparotomy and lysis of adhesions and partial small bowel x 2 on 7/24 *Subsequent anastomotic leak- omental patch of anastomosis 8/2  *CT abdomen and pelvis 04/29/2012 demonstrated several fluid collections consistent with intra-abdominal abscess with the largest measuring 16.5 x 13.7 x 6.4 cm.  *Patient underwent percutaneous drainage of largest fluid collection 04/30/2012  *Repeat CT done for increasing WBC count on 8/13 revealed enlarging abscess and enteric fistula and obstructed drainage tube. 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  *An attempt was made on 04/21/2012 to place a central line in interventional radiology under fluoroscopy but unfortunately central venous access could not be placed due to upper extremity and internal jugular vein access being chronically occluded bilaterally therefore rationale for why we have to continue to use this patient's right femoral Vas-Cath port for parenteral nutrition   Chronic nonocclusive left lower extremity DVT  *Diagnosed via Doppler 04/30/2012 - he continues with left lower extermity edema - f/u doppler is negative   CKD (chronic kidney disease) stage V requiring  chronic dialysis  *Dialysis per nephrology    HTN (hypertension) *Adequate control. I will continue current meds.  Anemia *Stable at 10.5. Due to acute blood loss and due to chronic disease - see above regarding wound bleeding  *Has received a total of 4 units of packed red blood cells this admission  *Cont aranesp and ferric gluconate   Chronic use of steroids *Hydrocortisone now down to 25 mg daily. I will continue until patient is able to resume his oral steroids.   Leg graft occlusion **s/p thrombectomy 7/25 - unfortunately re clotted - has undergone an additional thrombectomy procedure with revision of arterial and graft on 04/27/2012  *Was being dialyzed via HD cath in right groin but now graft is being used again  *See discussion per vascular surgery concerning narrowing at the venous anastomosis  Severe protein calorie malnutrition *Patient cachectic at presentation  *Parenteral nutrition managed by surgery and pharmacy   Disposition  * I spoke with Nephrology who reports that as long as the patient remains on TNA, he would likely require dialysis 4 x  week.  * Pursuing LTAC for this patient at [facility].  Code Status: Full Family Communication: Not applicable Disposition Plan: Not yet determined  [name]  [facility] Pager [phone]. If 8PM-8AM, please contact night-coverage at [email or web address], password [email or web address] 05/17/2012, 2:51 PM  LOS: 45 days   Brief narrative: This is a 51 year old male with past medical history of end-stage renal disease which dialyzes Monday Wednesdays and Fridays, who had a renal transplant about 17 years ago, prostatectomy in 2012, comes in for abdominal pain nausea or vomiting. He relates one day prior to admission he started having abdominal pain which progressively got worse it was mainly periumbilical nothing made it better or worse had no radiation. But in the middle of  the night he took some TUMS and the pain did not improve.  After this he started vomiting about 3 or 4 times. So he decided to come here to the emergency room. Here in the emergency room his pain was relieved by narcotics. A CT scan of the abdomen and pelvis was done that shows small bowel obstruction we were asked to mid and further evaluate.   Consultants: General surgery Nephrology Infectious disease the  Procedures: 7/24 Ex-lap, lysis of adhesions, partial small bowel resection x2  7/25 Thrombectomy L femoral AV graft  7/26 Extubated, CVL is out  7/27 Transferred to telemetry under [facility]  8/2 Acute abdomen, dehiscence of anastomosis, SBO, back from OR intubated in ICU on PCCM service. Found to have moderate peritonitis infraumbilically  8/2 Extubated   Antibiotics: Zosyn 8/1 >>>  Micafungin 8/3 >>8/16  Invanz 7/24 periop  Fluconazole- 8/2 + 8/17 >>  Vancomycin 8/8 >>> 05/01/2012   HPI/Subjective: Patient looks much better today and states that he feels better also.   Objective: Filed Vitals:   05/17/12 1153 05/17/12 1200 05/17/12 1217 05/17/12 1305  BP: 116/71 115/82 138/81 129/76  Pulse:  111 102 111  Temp:   96.8 F (36 C) 98 F (36.7 C)  TempSrc:   Oral Oral  Resp:   20 20  Height:      Weight:   52.3 kg (115 lb 4.8 oz)   SpO2:    100%   Weight change: 1.4 kg (3 lb 1.4 oz)  Intake/Output Summary (Last 24 hours) at 05/17/12 1451 Last data filed at 05/17/12 1300  Gross per 24 hour  Intake 850.17 ml  Output   3137 ml  Net -2286.83 ml    General: Patient is very cachectic appearance. HEENT: [place]/AT PEERL, EOMI Heart: Regular rate and rhythm, without murmurs, rubs, gallops.  Lungs: Clear to auscultation. Abdomen: Soft, non-tender, positive bowel sounds nondistended. Dressing recently changed by WOC on pouch. Percutaneous drain draining well. Neuro: No focal neurological deficits noted . Musculoskeletal: No warm swelling or erythema around joints, no spinal tenderness noted. No calf tenderness noted.   Data  Reviewed: Basic Metabolic Panel:  Lab 05/17/12 1610 05/16/12 0500 05/15/12 0900 05/13/12 0845 05/12/12 0739 05/11/12 0720  NA 126* 129* 127* -- 134* 132*  K 3.3* 3.5 3.5 -- 3.2* 3.3*  CL 89* 92* 91* -- 96 95*  CO2 21 25 19  -- 27 23  GLUCOSE 90 103* 88 -- 215* 108*  BUN 78* 47* 82* -- 45* 49*  CREATININE 5.86* 3.88* 5.40* -- 3.61* 4.25*  CALCIUM 9.5 9.3 9.7 -- 9.3 9.5  MG -- -- -- 1.5 -- --  PHOS -- -- 2.3 2.7 -- 2.4   Liver Function Tests:  Lab 05/17/12 0800 05/15/12 0900 05/11/12 0720  AST 32 -- 20  ALT 26 -- 18  ALKPHOS 134* -- 105  BILITOT 2.1* -- 1.7*  PROT 5.0* -- 5.1*  ALBUMIN 1.4* 1.5* 1.4*   No results found for this basename: LIPASE:5,AMYLASE:5 in the last 168 hours No results found for this basename: AMMONIA:5 in the last 168 hours CBC:  Lab 05/17/12 0800 05/15/12 0900 05/14/12 0355 05/13/12 0817 05/11/12 0720  WBC 6.2 7.9 7.6 10.3 13.5*  NEUTROABS 4.3 -- -- -- --  HGB 10.5* 9.9* 10.0* 9.6* 10.6*  HCT 31.2* 29.4* 30.8* 29.0* 31.3*  MCV 82.1 82.6 85.3 83.6 86.2  PLT 105* 103* 102* 99* 108*   Cardiac Enzymes: No results found for this basename: CKTOTAL:5,CKMB:5,CKMBINDEX:5,TROPONINI:5 in the last  168 hours BNP (last 3 results) No results found for this basename: PROBNP:3 in the last 8760 hours CBG:  Lab 05/17/12 1259 05/17/12 0352 05/16/12 2356 05/16/12 1959 05/16/12 1623  GLUCAP 78 91 97 103* 104*    No results found for this or any previous visit (from the past 240 hour(s)).   Studies: Ct Angio Chest Pe W/cm &/or Wo Cm  04/25/2012  *RADIOLOGY REPORT*  Clinical Data: Chest pain and cough.  CT ANGIOGRAPHY CHEST  Technique:  Multidetector CT imaging of the chest using the standard protocol during bolus administration of intravenous contrast. Multiplanar reconstructed images including MIPs were obtained and reviewed to evaluate the vascular anatomy.  Contrast: OMNIPAQUE IOHEXOL 350 MG/ML SOLN  Comparison: None.  Findings: Technically adequate study with  good opacification of the central and segmental pulmonary arteries.  No focal filling defects.  No evidence of significant pulmonary embolus.  Normal heart size.  Normal caliber thoracic aorta without dissection. Aortic and coronary artery calcifications.  Enteric tube with tip below the level of the stomach, not visualized.  Small amount of mucous in the trachea.  Airways generally appear patent.  There is a small left pleural effusion with basilar atelectasis or consolidation on the left.  Vague patchy infiltration in the left upper lung may represent edema or pneumonia.  No pneumothorax. There is pneumoperitoneum noted in the upper abdomen.  This likely results from recent surgery with noted laparotomy on 04/22/2012. Normal alignment of the thoracic spine.  IMPRESSION: No evidence of significant pulmonary embolus or aortic dissection. Small left pleural effusion with basilar atelectasis or consolidation.  Vague patchy infiltration in the left upper lung. Abdominal pneumoperitoneum consistent with recent surgery.  Original Report Authenticated By: Marlon Pel, M.D.   Ct Guided Abscess Drain  04/30/2012  *RADIOLOGY REPORT*  Indication: Post surgery for small bowel obstruction, now with anastomotic leak and large intra-abdominal abscess.  CT GUIDED RIGHT LOWER QUADRANT ABDOMINAL DRAINAGE CATHETER PLACEMENT  Comparison: CT abdomen pelvis - 04/29/2012  Medications: Fentanyl 75 mcg IV; Versed 1.5 mg IV  Total Moderate Sedation time: 20 minutes  Contrast: None  Complications: None immediate  Technique / Findings:  Informed written consent was obtained from the patient after a discussion of the risks, benefits and alternatives to treatment. The patient was placed supine on the CT gantry and a pre procedural CT was performed re-demonstrating the known abscess/fluid collection within the right lower abdominal quadrant.  The procedure was planned.   A timeout was performed prior to the initiation of the procedure.   The skin overlying the right lower abdomen was prepped and draped in the usual sterile fashion.   The overlying soft tissues were anesthetized with 1% lidocaine with epinephrine.  An 18 gauge trocar needle was advanced in to the abscess/fluid collection and a short Amplatz super stiff wire was coiled within the abscess/fluid collection.   Appropriate positioning was confirmed with a limited CT scan.  The tract was serially dilated allowing placement of a 10 [place] all-purpose drainage catheter.  Appropriate positioning was confirmed with a limited postprocedural CT scan.  200 ml of bloody, slightly purulent fluid was aspirated.  The tube was connected to a drainage bag and sutured in place.  A dressing was placed.  The patient tolerated the procedure well without immediate post procedural complication.  Impression:  Successful CT guided placement of a 10 [place] all purpose drain catheter into the right lower abdomen with aspiration of 200 mL of bloody, slightly purulent fluid.  Samples were sent to the laboratory as requested by the ordering clinical team.  Original Report Authenticated By: Waynard Reeds, M.D.   Ct Abdomen Pelvis W Contrast  05/04/2012  *RADIOLOGY REPORT*  Clinical Data: Status post surgery to treat the small bowel obstruction.  A right lower quadrant abscess was treated with percutaneous drainage on 04/30/2012.  CT ABDOMEN AND PELVIS WITH CONTRAST  Technique:  Multidetector CT imaging of the abdomen and pelvis was performed following the standard protocol during bolus administration of intravenous contrast.  Contrast:  75 ml Omnipaque-300 IV  Comparison: 04/30/2012 and 04/29/2012  Findings: Visualized lung bases show a left basilar pleural effusion and consolidation/atelectasis of the posterior left lower lobe.  There is extravasation of oral contrast from the small bowel into a residual abscess cavity within the right lower quadrant of the abdomen.  The percutaneous drain is located in the  posterior/dependent aspect of the cavity and shows some surrounding debris which may be acting to occlude/partially occlude the catheter.  The abscess cavity remains of significant size, measuring over 10 cm in greatest dimensions.  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  Consider repositioning/upsizing of the current percutaneous drain or placement of a second drain into the more anterior aspect of the residual collection.  No new abscess collections are identified. There is no evidence of gross free intraperitoneal air.  Solid organs of the abdomen are stable in appearance.  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 Indwelling percutaneous drain lies in the dependent portion of this collection which may contain debris obstructing the catheter. Given the large residual collection present, further drain manipulation including potential repositioning and upsizing of the current drain and also potentially placement of a second more anteriorly positioned drain may be helpful.  This will be communicated to the interventional Radiology service.  Original Report Authenticated By: Reola Calkins, M.D.   Ct Abdomen Pelvis W Contrast  04/29/2012  *RADIOLOGY REPORT*  Clinical Data: Post surgery for small bowel obstruction in an anastomotic leak.  Leukocytosis.  Dialysis patient.  CT ABDOMEN AND PELVIS WITH CONTRAST  Technique:  Multidetector CT imaging of the abdomen and pelvis was performed following the standard protocol during bolus administration of intravenous contrast.  Contrast:  OMNIPAQUE IOHEXOL 300 MG/ML  SOLN  Comparison: 04/03/2012  Findings: New small left pleural effusion with basilar atelectasis or consolidation on the left.  Enteric tube with tip in the distal stomach.  The liver and spleen appear homogeneous without enlargement.  Portal and mesenteric vessels appear patent.  The gallbladder is moderately distended and is filled with increased density material suggesting vicarious contrast excretion. Pancreatic duct is diffusely dilated as seen previously.  No obstructing mass or stone is visualized.  Consider MRCP for further evaluation.  Extensive calcification of the abdominal aorta, iliac vessels, and splenic artery.  Calcification of the mesenteric artery with flow demonstrated.  Left adrenal gland nodule measuring 14 mm diameter.  This appears stable since the previous study but density measures are indeterminate.  No significant retroperitoneal lymphadenopathy.  Bilateral renal atrophy with multiple bilateral renal  cysts.  No hydronephrosis.  The stomach, small bowel, and colon are decompressed.  No residual small bowel obstruction. Interval postoperative changes with midline surgical defect along the anterior abdominal wall and skin clips in the left groin region.  There is interval development of a large gas and fluid collections in the right pelvis extending from anteriorly to posteriorly and extending up into the right lower quadrant.  This collection measures up to about 16.5 x 13.7 x 6.4 cm.  The collection has a mildly thickened enhancing wall and air-fluid levels are present.  The appearance is consistent with loculated fluid or abscess.  There are additional loculated fluid collections in the anterior right lower quadrant measuring 3.3 x .9 centimeters and between the medial ascending colon and psoas muscle measuring 2.6 x 2.6 cm.  Sulci are consistent with abscesses.  Probably loculated fluid collection at the tip of the liver inferiorly. Infiltration into the  mesenteric fat in the upper abdomen around the spleen and liver may represent edema.  No loculation is apparent.  Pelvis:  Pelvic transplant kidney with diffuse atrophy and prominent renal sinus fat.  No hydronephrosis.  Vascular calcification in the graft.  There  is a vascular graft extending from the common femoral vein inferiorly and from the common femoral artery laterally on the left.  Extensive calcification of the native femoral arteries bilaterally with suggestion of significant stenosis of the right common femoral artery.  Edema in the subcutaneous soft tissues particularly on the left.  Normal alignment of the lumbar vertebra.  IMPRESSION: Interval postoperative changes.  No residual bowel obstruction. Multiple loculated fluid collections including a very large right abdominal and pelvic collection consistent with abscesses. Vicarious contrast excretion into the gallbladder.  New left pleural effusion and basilar atelectasis.  Additional incidental findings are stable since the previous study.  Results were telephoned to and Marchelle Folks, the patient's nurse on [facility]- 2600 at the time of dictation, 2129 hours on 04/29/2012.  Original Report Authenticated By: Marlon Pel, M.D.   Ir Catheter Tube Change  05/05/2012  *RADIOLOGY REPORT*  Clinical history:51 year old with multiple medical problems including a small bowel leak.  The patient has a right lower quadrant drain which is incompletely draining the large abscess collection.  PROCEDURE(S): EXCHANGE AND REPOSITIONING OF THE PERCUTANEOUS DRAINAGE CATHETER WITH FLUOROSCOPY  Physician: Rachelle Hora. Henn, MD  Medications:Versed 2 mg, Fentanyl 25 mcg. A radiology nurse monitored the patient for moderate sedation.  Moderate sedation time:22 minutes  Fluoroscopy time: 3.8 minutes  Procedure:Informed consent was obtained for a drain repositioning and exchange.  The patient was placed supine on the interventional table.  The existing catheter in the right lower  quadrant was prepped and draped in a sterile fashion.  Maximal barrier sterile technique was utilized including caps, mask, sterile gowns, sterile gloves, sterile drape, hand hygiene and skin antiseptic.  Catheter was injected with contrast and the catheter was cut and removed over a Bentson wire.  A Kumpe catheter was advanced into the upper portion of the collection.  Position was confirmed with ultrasound. A 12-French biliary drain was advanced over a Bentson wire and into the superior aspect the collection.  Greater than 200 ml of dark brown fluid was removed.  Catheter sutured to the skin and attached to a gravity bag.  Findings:The old catheter was located within the inferior posterior aspect of the abscess collection.  Adjacent to the collection, there is high density material which is probably related to the small bowel fistula or leak.  Catheter and wire were advanced into the anterior and superior aspect of the collection.  Biliary drain was also advanced into the superior aspect of the collection. Greater than 200 ml of dark brown fluid was removed.  Complications: None  Impression:Successful exchange and repositioning of the drainage catheter.  The large abscess was successfully decompressed following placement of the new catheter.  Original Report Authenticated By: Richarda Overlie, M.D.   Ir Fluoro Guide Cv Line Left  04/23/2012  *RADIOLOGY REPORT*  Clinical Data/Indication: BOWEL OBSTRUCTION.  CENTRAL VENOUS ACCESS REQUESTED.  IR LEFT FLOURO GUIDE CV LINE  Fluoroscopy Time: 2.5 minutes.  Procedure: The procedure, risks, benefits, and alternatives were explained to the patient. Questions regarding the procedure were encouraged and answered. The patient understands and consents to the procedure.  The right neck was prepped with betadine in a sterile fashion, and a sterile drape was applied covering the operative field. A sterile gown and sterile gloves were used for the procedure.  Sonographic imaging over  the right neck demonstrate a collateral venous structures only without a viable large central venous structure.  Under sonographic guidance, a micropuncture needle was inserted into one of the collateral vessels and removed over a 0018 wire.  The wire could not be advanced centrally.  Central venous access from the right neck was aborted.  Subsequently, under sonographic guidance, a micropuncture needle was inserted into the left internal jugular vein and removed over a 0.8 wire.  A 3-French dilator was inserted.  Central venography confirms central venous occlusion.  Findings: Central venous occlusion into the SVC is demonstrated. Center venous access was not placed.  Complications: None.  IMPRESSION: Central venous access was not placed.  Upper extremity and internal jugular vein center venous access is chronically occluded.  Original Report Authenticated By: Donavan Burnet, M.D.   Ir US Guide Vasc Access Left  04/23/2012  Please refer to accession number [id number].  Original Report Authenticated By: Donavan Burnet, M.D.   Dg Chest Port 1 View  05/05/2012  *RADIOLOGY REPORT*  Clinical Data: Chest pain.  PORTABLE CHEST - 1 VIEW  Comparison: 04/28/2012  Findings: Nasogastric tube extends at least as   the   stomach, tip not seen.  Left lower lung consolidation / atelectasis has partially improved since the previous exam.  Right lung remains clear.  Probable small left pleural effusion.  Regional bones unremarkable.  IMPRESSION:  1.  Slight improvement in the left lower lung consolidation / atelectasis, with persistent small effusion.  Original Report Authenticated By: Osa Craver, M.D.   Dg Chest Port 1 View  04/28/2012  *RADIOLOGY REPORT*  Clinical Data: Progressive leukocytosis.  PORTABLE CHEST - 1 VIEW  Comparison: Chest x-ray dated 04/23/2012 and chest CT dated 04/25/2012  Findings: There is a persistent moderate left effusion with compressive atelectasis in the left lower lobe.  Heart size and  vascularity are normal.  Right lung is clear.  NG tube tip is below the diaphragm.  IMPRESSION: Progressive left pleural effusion and left lower lobe atelectasis.  Original Report Authenticated By: Gwynn Burly, M.D.   Dg Chest Port 1 View  04/23/2012  *RADIOLOGY REPORT*  Clinical Data: Endotracheal tube placed  PORTABLE CHEST - 1 VIEW  Comparison: 04/15/2012  Findings: Endotracheal tube tip 7.2 cm proximal to the carina. Interval removal of the left IJ catheter.  Hyperinflated lungs. Mild retrocardiac opacity and left hemidiaphragm elevation.  No pneumothorax.  A enteric tube descends into the abdomen, tip not visualized.  There is nonspecific metallic density projecting over the left upper quadrant of the abdomen.  No acute osseous finding.  IMPRESSION: Endotracheal and enteric tubes as above.  Retrocardiac opacity; atelectasis, aspiration, or infiltrate.  Hyperinflation.  Original Report Authenticated By: Waneta Martins, M.D.   Dg Abd 2 Views  04/20/2012  *RADIOLOGY REPORT*  Clinical Data: Postop abdominal distention, recent of abdominal surgery for small bowel obstruction and lysis of the patient with partial small bowel resection  ABDOMEN - 2 VIEW  Comparison: Portable abdomen film of 04/16/2012  Findings: Both large and small bowel gas is present which may indicate postoperative ileus.  However there does appear be more distention of small bowel loops and a partial small bowel obstruction cannot be excluded.  Some contrast is scattered throughout the bowel.  Arterial calcifications are noted.  IMPRESSION: Distention of both large and small bowel may indicate ileus, but a partial small bowel obstruction cannot be excluded.  Original Report Authenticated By: Juline Patch, M.D.   Dg Abd Portable 1v  04/28/2012  *RADIOLOGY REPORT*  Clinical Data: Abdominal pain.  Ileus.  Progressive leukocytosis.  PORTABLE ABDOMEN - 1 VIEW  Comparison: Radiographs dated 04/20/2012  Findings: NG tube tip is in the  body of the stomach.  There are no dilated loops of bowel.  Double-lumen catheter is in the right femoral vein with the tips in the inferior vena cava.  Contrast is seen in the gallbladder.  There is also scattered high density material in the bowel.  IMPRESSION: No acute abnormalities.  Original Report Authenticated By: Gwynn Burly, M.D.   Dg Abd Portable 1v  04/16/2012  *RADIOLOGY REPORT*  Clinical Data: No bowel activity since exploratory laparoscopy 2 days ago question ileus, right lower quadrant pain  PORTABLE ABDOMEN - 1 VIEW  Comparison: 04/13/2012  Findings: Nasogastric tube in stomach. Retained contrast in colon. Surgical clips in pelvis bilaterally. Scattered atherosclerotic calcifications. No bowel dilatation, bowel wall thickening, or evidence of obstruction. Bowel staple lines noted in right mid abdomen.  IMPRESSION: Nonobstructive bowel gas pattern. Resolution of small bowel distention since prior exam.  Original Report Authenticated By: Lollie Marrow, M.D.   Ir Shuntogram/ Fistulagram Left Mod Sed  05/10/2012  *RADIOLOGY REPORT*  Clinical Data: Left leg swelling.  Graft revision 2 weeks ago.  AV SHUNTOGRAM  Procedure:  The left thigh was prepped and draped in a sterile fashion.  An 18 gauge Angiocath was inserted into the AV graft. Contrast was injected.  The Angiocath was removed.  Hemostasis was achieved with direct pressure. No complication.  Findings: The arterial anastomosis and graft are widely patent. There is moderate narrowing at the venous anastomosis.  There is associated narrowing of the external iliac vein.  Central venous structures are otherwise patent  IMPRESSION: Venous anastomotic and external iliac vein narrowing.  Otherwise patent circuit.   Original Report Authenticated By: Donavan Burnet, M.D. ( 05/10/2012 13:05:15 )     Scheduled Meds:    . antiseptic oral rinse  15 mL Mouth Rinse q12n4p  . chlorhexidine  15 mL Mouth Rinse BID  . darbepoetin (ARANESP)  injection - DIALYSIS  200 mcg Intravenous Q Sat-HD  . fentaNYL  37.5 mcg Transdermal Q72H  . fluconazole (DIFLUCAN) IV  200 mg Intravenous Once  . fluconazole (DIFLUCAN) IV  400 mg Intravenous Q T,Th,Sa-HD  . heparin  40 Units/kg Dialysis Once in dialysis  . hydrocortisone sod succinate (SOLU-CORTEF) injection  25 mg Intravenous Daily  . insulin aspart  0-15 Units Subcutaneous Q4H  . lip balm   Topical BID  . metoprolol  5 mg Intravenous Q6H  . pantoprazole  40 mg Oral Q1200  . piperacillin-tazobactam (ZOSYN)  IV  2.25 g Intravenous Q8H  . sodium chloride  10-40 mL Intracatheter Q12H   Continuous Infusions:    . fat emulsion    . TPN (CLINIMIX) +/- additives 70 mL/hr at 05/15/12 1718  . TPN (CLINIMIX) +/- additives 70 mL/hr at 05/16/12 1742  . TPN (CLINIMIX) +/- additives      Principal Problem:  *SBO (small bowel obstruction) s/p EL/LOA and SBR x 2 Active Problems:  CKD (chronic kidney disease) stage V requiring chronic dialysis  Thrombocytopenia  Gout  HTN (hypertension)  Ileus following gastrointestinal surgery  Chronic use of steroids  Malnutrition, calorie  Leg graft occlusion  Acute respiratory failure with hypoxia  Sinus tachycardia  Abscess of abdominal cavity  Wound dehiscence  Bleeding  Anemia

## 2012-05-17 NOTE — Procedures (Signed)
I was present at this dialysis session. I have reviewed the session itself and made appropriate changes.   Vinson Moselle, MD [employer] 05/17/2012, 2:46 PM

## 2012-05-17 NOTE — Progress Notes (Signed)
PARENTERAL NUTRITION CONSULT NOTE - FOLLOW UP  Pharmacy Consult for TPN Indication: SBO with new EC fistula  Allergies  Allergen Reactions  . Allopurinol     REACTION: decreased platelets  . Aspirin     REACTION: unspecified    Patient Measurements: Height: 5\' 11"  (180.3 cm) Weight: 122 lb 9.2 oz (55.6 kg) IBW/kg (Calculated) : 75.3    Vital Signs: Temp: 98.1 F (36.7 C) (08/26 0744) Temp src: Oral (08/26 0744) BP: 138/78 mmHg (08/26 0900) Pulse Rate: 85  (08/26 0900) Intake/Output from previous day: 08/25 0701 - 08/26 0700 In: 910.2 [IV Piggyback:100; TPN:785.2] Out: 120 [Drains:120] Intake/Output from this shift:    Labs:  Basename 05/17/12 0800 05/15/12 0900  WBC 6.2 7.9  HGB 10.5* 9.9*  HCT 31.2* 29.4*  PLT 105* 103*  APTT -- --  INR -- --     Basename 05/16/12 0500 05/15/12 0900  NA 129* 127*  K 3.5 3.5  CL 92* 91*  CO2 25 19  GLUCOSE 103* 88  BUN 47* 82*  CREATININE 3.88* 5.40*  LABCREA -- --  CREAT24HRUR -- --  CALCIUM 9.3 9.7  MG -- --  PHOS -- 2.3  PROT -- --  ALBUMIN -- 1.5*  AST -- --  ALT -- --  ALKPHOS -- --  BILITOT -- --  BILIDIR -- --  IBILI -- --  PREALBUMIN -- --  TRIG -- --  CHOLHDL -- --  CHOL -- --   Estimated Creatinine Clearance: 17.5 ml/min (by C-G formula based on Cr of 3.88).    Basename 05/17/12 0352 05/16/12 2356 05/16/12 1959  GLUCAP 91 97 103*   Insulin Requirements in the past 24 hours:  20 units regular insulin in TNA  Nutritional Goals:  1700-1950 kCal, 80-100 grams of protein per day   Current Nutrition:  Clinimix 5/15 at 70 ml/hr with 20% lipids at 10 ml/hr MWF provides an average of 1398 kcal and 84g protein per day  Admit: Admitted 7/13 with abd pain, weight loss, SBO. S/p LOA, SBR 7/24 and repeat LOA, SBR with anasomotic leak repair 8/1, thrombectomy with AVG revision 7/25 and repeat thrombectomy 8/6. CT on 8/13 reveals enlarging abscess and enteric fistula.  GI: NGT out, Drains with minimal  bilious drainage but some blood mixed in; tolerating TPN. Pt allowed to have ice chips and sips of water. Per RD note, pt is tolerating ice chips but is reluctant to pursue water/clears 2/2 fear of emesis or abdominal pain. Endo: CBGs low-nl, no SSI x 48 hours. Solu-Cortef daily.  Lytes: Na low, d/t overload and "dilute" TPN. K 3.3 - Renal MD to address. Hypercalcemia (corrected Ca 11.6, Ca x PO4 < 55),  Renal: ESRD with HD typically TTS. HD is currently being administered prn d/t volume issues - received HD 8/19,20,22, 24, 26. On Aranesp qSat.  Cards: BP mildly elevated (had hypotension during HD). HR controlled. On scheduled lopressor and prn hydralazine.  Hepatobil: LFTs WNL 8/20. Prealbumin was at goal (8/18). Trig WNL.  Neuro: GCS 15, no c/o pain - on fentanyl patch, prn pain meds.  ID: Fluconazole + Zosyn for peritonitis/abd abscess -- ID following. Afeb, WBC wnl. Abd abscess culture: candida tropicalis.  Zosyn 8.1>>  Diflucan 8.16>> (s/p 14 d mycamine) Best Practices: Mouthcare, PPI-IV   Plan:  - Continue Clinimix 5/15 (no lytes) @ 70 ml/hr to avoid exacerbating volume status and patient to start PO's soon hopefully. MD / RD aware TPN not meeting kcal goals with Clinimix alone. May  need to consider increasing TPN rate if pt unable to take po's soon. - Will decrease insulin in TPN to 15 units/L with low-nl CBGs to avoid hypoglycemic event. - Provide available trace elements, MVI and lipids @10cc /hr on MWF only d/t national backorder  - Will f/up Mg, Phos, CMET in a.m. and every Mon and Thur according to TPN protocol  Thanks, Christoper Fabian, PharmD, BCPS Clinical pharmacist, pager [phone] 05/17/2012 9:16 AM

## 2012-05-17 NOTE — Progress Notes (Signed)
Subjective: Pt ok, Seen in HD again  Objective: Physical Exam: BP 139/78  Pulse 90  Temp 98.1 F (36.7 C) (Oral)  Resp 18  Ht 5\' 11"  (1.803 m)  Wt 122 lb 9.2 oz (55.6 kg)  BMI 17.10 kg/m2  SpO2 100% Drain intact, site clean. Still sl bloody enteric output 75cc recorded yesterday   Labs: CBC  Basename 05/17/12 0800 05/15/12 0900  WBC 6.2 7.9  HGB 10.5* 9.9*  HCT 31.2* 29.4*  PLT 105* 103*   BMET  Basename 05/17/12 0800 05/16/12 0500  NA 126* 129*  K 3.3* 3.5  CL 89* 92*  CO2 21 25  GLUCOSE 90 103*  BUN 78* 47*  CREATININE 5.86* 3.88*  CALCIUM 9.5 9.3   LFT  Basename 05/17/12 0800  PROT 5.0*  ALBUMIN 1.4*  AST 32  ALT 26  ALKPHOS 134*  BILITOT 2.1*  BILIDIR --  IBILI --  LIPASE --   PT/INR No results found for this basename: LABPROT:2,INR:2 in the last 72 hours   Studies/Results: No results found.  Assessment/Plan: RLQ abscess and controlled fistula  S/p perc drain 8/19 with upsize 8/14.  No complaints  followed by CCS    LOS: 45 days    Brayton El PA-C 05/17/2012 10:04 AM

## 2012-05-17 NOTE — Progress Notes (Signed)
Patient ID: [name], male   DOB: [birth year], 51 y.o.   MRN: [id number] Patient ID: [name], male   DOB: Dec 19, 1960, 88 y.o.   MRN: [id number]   Subjective: Denies abdominal pain this am, no gas in eakins pouch today.  Objective: Vital signs in last 24 hours: Temp:  [97.4 F (36.3 C)-99 F (37.2 C)] 98.1 F (36.7 C) (08/26 0744) Pulse Rate:  [71-79] 76  (08/26 0814) Resp:  [17-18] 18  (08/26 0744) BP: (132-165)/(61-79) 152/76 mmHg (08/26 0814) SpO2:  [97 %-100 %] 100 % (08/26 0555) Weight:  [122 lb 9.2 oz (55.6 kg)] 122 lb 9.2 oz (55.6 kg) (08/26 0133) Last BM Date:  (unable to recall)  Intake/Output from previous day: 08/25 0701 - 08/26 0700 In: 910.2 [IV Piggyback:100; TPN:785.2] Out: 120 [Drains:120] Intake/Output this shift:   Physical Exam: General: No acute Distress Abd: flat, soft, mildly tender, eakins pouch appears healthy with bilous/enteric looking output Perc drain on right with bloody enteric looking drainage. +bs VSS, afebrile.  Lab Results:   Basename 05/15/12 0900  WBC 7.9  HGB 9.9*  HCT 29.4*  PLT 103*   BMET  Basename 05/16/12 0500 05/15/12 0900  NA 129* 127*  K 3.5 3.5  CL 92* 91*  CO2 25 19  GLUCOSE 103* 88  BUN 47* 82*  CREATININE 3.88* 5.40*  CALCIUM 9.3 9.7   PT/INR No results found for this basename: LABPROT:2,INR:2 in the last 72 hours ABG No results found for this basename: PHART:2,PCO2:2,PO2:2,HCO3:2 in the last 72 hours  Studies/Results: No results found.  Anti-infectives: Anti-infectives     Start     Dose/Rate Route Frequency Ordered Stop   05/08/12 1200   fluconazole (DIFLUCAN) IVPB 400 mg        400 mg 200 mL/hr over 60 Minutes Intravenous Every T-Th-Sa (Hemodialysis) 05/07/12 1752     04/30/12 1400   vancomycin (VANCOCIN) 750 mg in sodium chloride 0.9 % 150 mL IVPB  Status:  Discontinued        750 mg 150 mL/hr over 60 Minutes Intravenous  Once 04/30/12 1212 04/30/12 1507   04/29/12 2330   vancomycin  (VANCOCIN) 1,500 mg in sodium chloride 0.9 % 500 mL IVPB        1,500 mg 250 mL/hr over 120 Minutes Intravenous  Once 04/29/12 2259 04/30/12 0246   04/27/12 0000   ceFAZolin (ANCEF) IVPB 1 g/50 mL premix  Status:  Discontinued     Comments: Send with pt to OR      1 g 100 mL/hr over 30 Minutes Intravenous On call 04/26/12 0927 04/26/12 0928   04/24/12 1000   micafungin (MYCAMINE) 100 mg in sodium chloride 0.9 % 100 mL IVPB  Status:  Discontinued        100 mg 100 mL/hr over 1 Hours Intravenous Daily 04/24/12 0750 05/07/12 1713   04/23/12 1600   fluconazole (DIFLUCAN) IVPB 400 mg        400 mg 200 mL/hr over 60 Minutes Intravenous  Once 04/23/12 1428 04/23/12 1800   04/22/12 2200   piperacillin-tazobactam (ZOSYN) IVPB 3.375 g  Status:  Discontinued        3.375 g 100 mL/hr over 30 Minutes Intravenous 3 times per day 04/22/12 2120 04/22/12 2137   04/22/12 2200   piperacillin-tazobactam (ZOSYN) IVPB 2.25 g        2.25 g 100 mL/hr over 30 Minutes Intravenous 3 times per day 04/22/12 2140     04/22/12 2157  gentamicin (GARAMYCIN) injection  Status:  Discontinued          As needed 04/22/12 2158 04/22/12 2335   04/22/12 2156   clindamycin (CLEOCIN) injection  Status:  Discontinued          As needed 04/22/12 2157 04/22/12 2335   04/22/12 2145   piperacillin-tazobactam (ZOSYN) IVPB 3.375 g        3.375 g 12.5 mL/hr over 240 Minutes Intravenous  Once 04/22/12 2141 04/23/12 0145   04/22/12 2130   gentamicin (GARAMYCIN) 240 mg, clindamycin (CLEOCIN) 900 mg in sodium chloride irrigation 0.9 % 1,000 mL irrigation  Status:  Discontinued         Irrigation  Once 04/22/12 2119 04/23/12 0020   04/14/12 0830   ertapenem (INVANZ) 1 g in sodium chloride 0.9 % 50 mL IVPB        1 g 100 mL/hr over 30 Minutes Intravenous To Surgery 04/14/12 0816 04/14/12 0834   04/13/12 1359   ertapenem (INVANZ) 1 g in sodium chloride 0.9 % 50 mL IVPB  Status:  Discontinued        1 g 100 mL/hr over 30  Minutes Intravenous 60 min pre-op 04/13/12 1359 04/14/12 0816          Assessment/Plan:  S/P SBR with EC fistula - continue Eakins pouch and IR drain.  Continue with sips of water and ice chips.  ID - Zosyn ESRD - per nephrology, s/p revision, Perm cath in place FEN - TNA  LOS: 45 days    [name] 05/17/2012

## 2012-05-17 NOTE — Consult Note (Addendum)
Wound care follow-up:  Eakin pouch changed to abd wound.  Full thickness area with previous amt large bloody drainage has decreased in size and amt of drainage.  Upper wound slowly closing where Aquacel is applied instead of Eakin pouch. Wound bed 95% red, 5% yellow, small dark red drainage.  If amt continues to be minimal this week, pt can probably resume gauze dressings instead of using Eakin pouch.  Supplies at bedside for staff use if leakage occurs.   Cammie Mcgee, RN, MSN, [employer]  [phone]

## 2012-05-17 NOTE — Progress Notes (Signed)
Subjective:  On hd no cos Objective Vital signs in last 24 hours: Filed Vitals:   05/17/12 0814 05/17/12 0830 05/17/12 0900 05/17/12 0930  BP: 152/76 155/80 138/78 139/78  Pulse: 76 80 85 90  Temp:      TempSrc:      Resp:      Height:      Weight:      SpO2:       Weight change: 1.4 kg (3 lb 1.4 oz)  Intake/Output Summary (Last 24 hours) at 05/17/12 0944 Last data filed at 05/17/12 1191  Gross per 24 hour  Intake 900.17 ml  Output    120 ml  Net 780.17 ml   Labs: Basic Metabolic Panel:  Lab 05/17/12 4782 05/16/12 0500 05/15/12 0900 05/13/12 0845 05/11/12 0720  NA 126* 129* 127* -- --  K 3.3* 3.5 3.5 -- --  CL 89* 92* 91* -- --  CO2 21 25 19  -- --  GLUCOSE 90 103* 88 -- --  BUN 78* 47* 82* -- --  CREATININE 5.86* 3.88* 5.40* -- --  CALCIUM 9.5 9.3 9.7 -- --  ALB -- -- -- -- --  PHOS -- -- 2.3 2.7 2.4   Liver Function Tests:  Lab 05/17/12 0800 05/15/12 0900 05/11/12 0720  AST 32 -- 20  ALT 26 -- 18  ALKPHOS 134* -- 105  BILITOT 2.1* -- 1.7*  PROT 5.0* -- 5.1*  ALBUMIN 1.4* 1.5* 1.4*   No results found for this basename: LIPASE:3,AMYLASE:3 in the last 168 hours No results found for this basename: AMMONIA:3 in the last 168 hours CBC:  Lab 05/17/12 0800 05/15/12 0900 05/14/12 0355 05/13/12 0817 05/11/12 0720  WBC 6.2 7.9 7.6 -- --  NEUTROABS 4.3 -- -- -- --  HGB 10.5* 9.9* 10.0* -- --  HCT 31.2* 29.4* 30.8* -- --  MCV 82.1 82.6 85.3 83.6 86.2  PLT 105* 103* 102* -- --   Cardiac Enzymes: No results found for this basename: CKTOTAL:5,CKMB:5,CKMBINDEX:5,TROPONINI:5 in the last 168 hours CBG:  Lab 05/17/12 0352 05/16/12 2356 05/16/12 1959 05/16/12 1623 05/16/12 1205  GLUCAP 91 97 103* 104* 103*    Iron Studies: No results found for this basename: IRON,TIBC,TRANSFERRIN,FERRITIN in the last 72 hours Studies/Results: No results found. Medications:    . fat emulsion    . TPN (CLINIMIX) +/- additives 70 mL/hr at 05/15/12 1718  . TPN (CLINIMIX) +/-  additives 70 mL/hr at 05/16/12 1742  . TPN (CLINIMIX) +/- additives    . DISCONTD: sodium chloride 10 mL/hr at 05/15/12 0600      . antiseptic oral rinse  15 mL Mouth Rinse q12n4p  . chlorhexidine  15 mL Mouth Rinse BID  . darbepoetin (ARANESP) injection - DIALYSIS  200 mcg Intravenous Q Sat-HD  . fentaNYL  37.5 mcg Transdermal Q72H  . fluconazole (DIFLUCAN) IV  400 mg Intravenous Q T,Th,Sa-HD  . heparin  40 Units/kg Dialysis Once in dialysis  . hydrocortisone sod succinate (SOLU-CORTEF) injection  25 mg Intravenous Daily  . insulin aspart  0-15 Units Subcutaneous Q4H  . lip balm   Topical BID  . metoprolol  5 mg Intravenous Q6H  . pantoprazole  40 mg Oral Q1200  . piperacillin-tazobactam (ZOSYN)  IV  2.25 g Intravenous Q8H  . sodium chloride  10-40 mL Intracatheter Q12H   I  have reviewed scheduled and prn medications.  Physical Exam: General: NAD,alert, cooperative, and cachectic   Heart: RRR, 1/6 hsm  At apex Lungs: CTA bilaterally Abdomen: positive  bs, Ostomy bag middle abd. over wound, soft nintender Extremities: Dialysis Access: Left groin avgg patent on hd and righ t groin iv cental line   Dialysis Orders: Center: [facility] on TTS.  EDW 52.5 kg HD Bath 2K/2Ca Time 3.5 hrs Heparin 5000 U. Access AVG @ left thigh BFR 400 DFR 800 Zemplar 0 mcg IV/HD Epogen 0 Units IV/HD Venofer 50/week; last iPTH 314, but   Problem/Plan: 1. SBO sp surg/dehisc/abscess= on Zosyn, Diflucan, CCS managing 2. ESRD - volume XS with hd today  ,then normal tts ([facility]) Using NO heparin as CCS recommending 3. Anemia -10.5 hgb on aranesp 200 no iron 4. Secondary hyperparathyroidism - ca 9.5 with alb 1.4, phos 2.4/no binders 5. HTN/volume - today hd  For vol.bp = 138/78     On hd for4l uf goal/ Metoprolol 5mg  q6hr    Wt 55.6 kg today   OLD EDW 52.5   Hd again tomorrow to keep on sched. 6.    Malnutrition = On TNA 7.    GOUT= stable  No pain on Solu-Cortef [name], PA-C [facility] Beeper [phone] 05/17/2012,9:44 AM  LOS: 45 days   Patient seen and examined and agree with assessment and plan as above.  Vinson Moselle  MD [employer] [phone] pgr    [phone] cell 05/17/2012, 1:11 PM

## 2012-05-17 NOTE — Progress Notes (Signed)
ANTIBIOTIC CONSULT NOTE - FOLLOW UP  Pharmacy Consult for Fluconazole and Zosyn Indication: abdominal abscess with fistula  Allergies  Allergen Reactions  . Allopurinol     REACTION: decreased platelets  . Aspirin     REACTION: unspecified    Patient Measurements: Height: 5\' 11"  (180.3 cm) Weight: 122 lb 9.2 oz (55.6 kg) IBW/kg (Calculated) : 75.3   Vital Signs: Temp: 98.1 F (36.7 C) (08/26 0744) Temp src: Oral (08/26 0744) BP: 111/73 mmHg (08/26 1100) Pulse Rate: 128  (08/26 1100) Intake/Output from previous day: 08/25 0701 - 08/26 0700 In: 910.2 [IV Piggyback:100; TPN:785.2] Out: 120 [Drains:120]  Labs:  Basename 05/17/12 0800 05/16/12 0500 05/15/12 0900  WBC 6.2 -- 7.9  HGB 10.5* -- 9.9*  PLT 105* -- 103*  LABCREA -- -- --  CREATININE 5.86* 3.88* 5.40*   Assessment: 51 y.o. M on Zosyn + Fluconazole per ID recommendations for abdominal abscess. The patient continues to have a drain in place. 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 ID recommends to continue treatment for at least another month and then to re-evaluate.  The patient has ESRD with a normal HD schedule of T/Th/Sat. It is noted that the patient is getting an extra HD session this morning to help with fluid removal. Will schedule an extra dose of fluconazole this evening to replace what is removed and then continue current regimen.   8/1 Zosyn>> 8/3 Micafungin >> 8/16 8/2 Fluconazole 400 mg IV x 1 >>resume 8/17  Goal of Therapy:  Appropriate doses for renal function and infection  Plan:  1. Fluconazole 200 mg IV x 1 dose this afternoon after hemodialysis 2. Continue Fluconazole 400 mg IV after HD sessions on T/Th/Sat starting on 8/27.  3. Continue Zoysn 2.25g IV every 8 hours 4. Will continue to follow HD schedule/duration, culture results, LOT, and ID recommendatoins  Georgina Pillion, PharmD, BCPS Clinical Pharmacist Pager: [phone] 05/17/2012 11:27 AM

## 2012-05-17 NOTE — Progress Notes (Signed)
Noted WOC note and improvement in abdominal wound site, also noted per charge nurse that pt will continue to require hemodialysis 4 x per week for now, call placed to [facility] representative re HD 4X per week, she will review with her administration for possible admission.  Johny Shock RN MPH Case Manager [phone]

## 2012-05-17 NOTE — Progress Notes (Signed)
Some bloody drainage mixed with enteric contents in drain and Eakins pouch Try to avoid heparin with dialysis  Continue current management.  Wilmon Arms. Corliss Skains, MD, [facility]  05/17/2012 9:18 AM

## 2012-05-18 ENCOUNTER — Inpatient Hospital Stay (HOSPITAL_COMMUNITY): Payer: Medicare Other

## 2012-05-18 LAB — COMPREHENSIVE METABOLIC PANEL
ALT: 28 U/L (ref 0–53)
AST: 37 U/L (ref 0–37)
Albumin: 1.5 g/dL — ABNORMAL LOW (ref 3.5–5.2)
Alkaline Phosphatase: 135 U/L — ABNORMAL HIGH (ref 39–117)
GFR calc Af Amer: 16 mL/min — ABNORMAL LOW (ref >90)
Glucose, Bld: 91 mg/dL (ref 70–99)
Potassium: 3.5 mEq/L (ref 3.5–5.1)
Sodium: 134 mEq/L — ABNORMAL LOW (ref 135–145)
Total Protein: 5.3 g/dL — ABNORMAL LOW (ref 6.0–8.3)

## 2012-05-18 LAB — GLUCOSE, CAPILLARY
Glucose-Capillary: 100 mg/dL — ABNORMAL HIGH (ref 70–99)
Glucose-Capillary: 100 mg/dL — ABNORMAL HIGH (ref 70–99)
Glucose-Capillary: 105 mg/dL — ABNORMAL HIGH (ref 70–99)
Glucose-Capillary: 119 mg/dL — ABNORMAL HIGH (ref 70–99)

## 2012-05-18 MED ORDER — DEXTROSE 5 % IV SOLN: 10.0000 mmol | Freq: Once | INTRAVENOUS | Status: DC

## 2012-05-18 MED ORDER — INSULIN ASPART 100 UNIT/ML ~~LOC~~ SOLN
0.0000 [IU] | Freq: Three times a day (TID) | SUBCUTANEOUS | Status: DC
  Administered 2012-05-19 – 2012-05-20 (×3): 2 [IU] via SUBCUTANEOUS
  Administered 2012-05-20: 3 [IU] via SUBCUTANEOUS
  Administered 2012-05-21 – 2012-05-22 (×3): 2 [IU] via SUBCUTANEOUS
  Administered 2012-05-23: 3 [IU] via SUBCUTANEOUS
  Administered 2012-05-24 – 2012-05-25 (×4): 2 [IU] via SUBCUTANEOUS
  Administered 2012-05-26 – 2012-05-27 (×2): 3 [IU] via SUBCUTANEOUS
  Administered 2012-05-28 – 2012-06-02 (×5): 2 [IU] via SUBCUTANEOUS

## 2012-05-18 MED ORDER — INSULIN REGULAR HUMAN 100 UNIT/ML IJ SOLN
INTRAVENOUS | Status: AC
  Administered 2012-05-18: 17:00:00 via INTRAVENOUS
  Filled 2012-05-18: qty 2000

## 2012-05-18 MED ORDER — SODIUM CHLORIDE 0.9 % IV SOLN
10.0000 mmol | Freq: Once | INTRAVENOUS | Status: AC
  Administered 2012-05-18: 10 mmol via INTRAVENOUS
  Filled 2012-05-18: qty 10

## 2012-05-18 NOTE — Progress Notes (Signed)
Continue current management.  Wilmon Arms. Corliss Skains, MD, [facility]  05/18/2012 10:21 AM

## 2012-05-18 NOTE — Progress Notes (Signed)
Pt off unit

## 2012-05-18 NOTE — Progress Notes (Signed)
Patient ID: [name], male   DOB: [birth year], 51 y.o.   MRN: [id number] Subjective: No complaints, Eakins pouch changed yesterday, denies abd pain.  Objective: Vital signs in last 24 hours: Temp:  [96.8 F (36 C)-98.6 F (37 C)] 98.6 F (37 C) (08/27 0416) Pulse Rate:  [76-128] 80  (08/27 0416) Resp:  [18-20] 18  (08/27 0416) BP: (85-155)/(49-82) 145/67 mmHg (08/27 0416) SpO2:  [97 %-100 %] 100 % (08/27 0416) Weight:  [115 lb 4.8 oz (52.3 kg)] 115 lb 4.8 oz (52.3 kg) (08/26 2056) Last BM Date:  (unable to recall)  Intake/Output from previous day: 08/26 0701 - 08/27 0700 In: 490 [P.O.:480] Out: 3062 [Urine:20; Drains:25] Intake/Output this shift:   Physical Exam: General: No acute distress Abd: flat, soft, mildly tender, eakins pouch appears healthy with bilous/enteric looking output Perc drain on right with bloody enteric looking drainage. +bs   Lab Results:   Basename 05/17/12 0800 05/15/12 0900  WBC 6.2 7.9  HGB 10.5* 9.9*  HCT 31.2* 29.4*  PLT 105* 103*   BMET  Basename 05/17/12 0800 05/16/12 0500  NA 126* 129*  K 3.3* 3.5  CL 89* 92*  CO2 21 25  GLUCOSE 90 103*  BUN 78* 47*  CREATININE 5.86* 3.88*  CALCIUM 9.5 9.3   PT/INR No results found for this basename: LABPROT:2,INR:2 in the last 72 hours ABG No results found for this basename: PHART:2,PCO2:2,PO2:2,HCO3:2 in the last 72 hours  Studies/Results: No results found.  Anti-infectives: Anti-infectives     Start     Dose/Rate Route Frequency Ordered Stop   05/17/12 1600   fluconazole (DIFLUCAN) IVPB 200 mg        200 mg 100 mL/hr over 60 Minutes Intravenous  Once 05/17/12 1129 05/17/12 1940   05/08/12 1200   fluconazole (DIFLUCAN) IVPB 400 mg        400 mg 200 mL/hr over 60 Minutes Intravenous Every T-Th-Sa (Hemodialysis) 05/07/12 1752     04/30/12 1400   vancomycin (VANCOCIN) 750 mg in sodium chloride 0.9 % 150 mL IVPB  Status:  Discontinued        750 mg 150 mL/hr over 60 Minutes  Intravenous  Once 04/30/12 1212 04/30/12 1507   04/29/12 2330   vancomycin (VANCOCIN) 1,500 mg in sodium chloride 0.9 % 500 mL IVPB        1,500 mg 250 mL/hr over 120 Minutes Intravenous  Once 04/29/12 2259 04/30/12 0246   04/27/12 0000   ceFAZolin (ANCEF) IVPB 1 g/50 mL premix  Status:  Discontinued     Comments: Send with pt to OR      1 g 100 mL/hr over 30 Minutes Intravenous On call 04/26/12 0927 04/26/12 0928   04/24/12 1000   micafungin (MYCAMINE) 100 mg in sodium chloride 0.9 % 100 mL IVPB  Status:  Discontinued        100 mg 100 mL/hr over 1 Hours Intravenous Daily 04/24/12 0750 05/07/12 1713   04/23/12 1600   fluconazole (DIFLUCAN) IVPB 400 mg        400 mg 200 mL/hr over 60 Minutes Intravenous  Once 04/23/12 1428 04/23/12 1800   04/22/12 2200   piperacillin-tazobactam (ZOSYN) IVPB 3.375 g  Status:  Discontinued        3.375 g 100 mL/hr over 30 Minutes Intravenous 3 times per day 04/22/12 2120 04/22/12 2137   04/22/12 2200   piperacillin-tazobactam (ZOSYN) IVPB 2.25 g        2.25 g 100 mL/hr over  30 Minutes Intravenous 3 times per day 04/22/12 2140     04/22/12 2157   gentamicin (GARAMYCIN) injection  Status:  Discontinued          As needed 04/22/12 2158 04/22/12 2335   04/22/12 2156   clindamycin (CLEOCIN) injection  Status:  Discontinued          As needed 04/22/12 2157 04/22/12 2335   04/22/12 2145   piperacillin-tazobactam (ZOSYN) IVPB 3.375 g        3.375 g 12.5 mL/hr over 240 Minutes Intravenous  Once 04/22/12 2141 04/23/12 0145   04/22/12 2130   gentamicin (GARAMYCIN) 240 mg, clindamycin (CLEOCIN) 900 mg in sodium chloride irrigation 0.9 % 1,000 mL irrigation  Status:  Discontinued         Irrigation  Once 04/22/12 2119 04/23/12 0020   04/14/12 0830   ertapenem (INVANZ) 1 g in sodium chloride 0.9 % 50 mL IVPB        1 g 100 mL/hr over 30 Minutes Intravenous To Surgery 04/14/12 0816 04/14/12 0834   04/13/12 1359   ertapenem (INVANZ) 1 g in sodium chloride  0.9 % 50 mL IVPB  Status:  Discontinued        1 g 100 mL/hr over 30 Minutes Intravenous 60 min pre-op 04/13/12 1359 04/14/12 0816          Assessment/Plan:  S/P SBR with EC fistula - continue Eakins pouch and IR drain.  Continue with sips of water and ice chips.  ID - Zosyn ESRD - per nephrology, s/p revision, Perm cath in place FEN - TNA  LOS: 46 days    Havilah Topor 05/18/2012

## 2012-05-18 NOTE — Progress Notes (Signed)
21 Days Post-Op  Subjective: RLQ drain placed 8/9; up sized 8/14 Output still blood Pt doing well  Objective: Vital signs in last 24 hours: Temp:  [96.8 F (36 C)-98.6 F (37 C)] 98.6 F (37 C) (08/27 0416) Pulse Rate:  [80-128] 80  (08/27 0416) Resp:  [18-20] 18  (08/27 0416) BP: (85-145)/(49-82) 145/67 mmHg (08/27 0416) SpO2:  [97 %-100 %] 100 % (08/27 0416) Weight:  [115 lb 4.8 oz (52.3 kg)] 115 lb 4.8 oz (52.3 kg) (08/26 2056) Last BM Date:  (Pt stated "late July")  Intake/Output from previous day: 08/26 0701 - 08/27 0700 In: 490 [P.O.:480] Out: 3062 [Urine:20; Drains:25] Intake/Output this shift:    PE:  Afeb; vss Wbc wnl Output 120cc yesterday; bloody 30 cc in bag now Site clean and dry; nt Cx: yeast   Lab Results:   Basename 05/17/12 0800 05/15/12 0900  WBC 6.2 7.9  HGB 10.5* 9.9*  HCT 31.2* 29.4*  PLT 105* 103*   BMET  Basename 05/17/12 0800 05/16/12 0500  NA 126* 129*  K 3.3* 3.5  CL 89* 92*  CO2 21 25  GLUCOSE 90 103*  BUN 78* 47*  CREATININE 5.86* 3.88*  CALCIUM 9.5 9.3   PT/INR No results found for this basename: LABPROT:2,INR:2 in the last 72 hours ABG No results found for this basename: PHART:2,PCO2:2,PO2:2,HCO3:2 in the last 72 hours  Studies/Results: No results found.  Anti-infectives:   Assessment/Plan: s/p Procedure(s) (LRB): THROMBECTOMY ARTERIOVENOUS GORE-TEX GRAFT (Left) REVISION OF ARTERIOVENOUS GORETEX GRAFT (Left)  RLQ drain intact Output still significant; bloody CT soon? Plan per [facility]   Kennet Mccort A 05/18/2012

## 2012-05-18 NOTE — Progress Notes (Signed)
[facility] PROGRESS NOTE  [name] OIN:[id number] DOB: 12/02/1960 DOA: 04/02/2012 PCP: Stephen Iha, MD Brief narrative: This is a 51 year old male with past medical history of end-stage renal disease which dialyzes Monday Wednesdays and Fridays, who had a renal transplant about 17 years ago, prostatectomy in 2012, comes in for abdominal pain nausea or vomiting. He relates one day prior to admission he started having abdominal pain which progressively got worse it was mainly periumbilical nothing made it better or worse had no radiation. But in the middle of the night he took some TUMS and the pain did not improve. After this he started vomiting about 3 or 4 times. So he decided to come here to the emergency room. Here in the emergency room his pain was relieved by narcotics. A CT scan of the abdomen and pelvis was done that shows small bowel obstruction we were asked to mid and further evaluate.  Since his hospitalization the patient had exploratory laparotomy and lysis of adhesions as a result of a small bowel obstruction. He subsequently had an anastomotic leak with an abscess which is has necessitated an omental patching of the anastomosis and percutaneous drain placement. The patient had an elevation of his white blood cell count and CT of the abdomen revealed an enlarging abscess an enteric fistula was obstructed drainage tube necessitated repositioning of the percutaneous drain a percutaneous drain. Presently the patient has an open abdominal wound which is connected with the abscess cavity and is being managed by general surgery and wound ostomy care. The patient also had   Assessment/Plan: Principal Problem: SBO (small bowel obstruction) s/p exploratory laparotomy and lysis of adhesions and partial small bowel x 2 on 7/24 *Subsequent anastomotic leak- omental patch of anastomosis 8/2  *CT abdomen and pelvis 04/29/2012 demonstrated several fluid collections consistent with  intra-abdominal abscess with the largest measuring 16.5 x 13.7 x 6.4 cm.  *Patient underwent percutaneous drainage of largest fluid collection 04/30/2012  *Repeat CT done for increasing WBC count on 8/13 revealed enlarging abscess and enteric fistula and obstructed drainage tube. 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  *An attempt was made on 04/21/2012 to place a central line in interventional radiology under fluoroscopy but unfortunately central venous access could not be placed due to upper extremity and internal jugular vein access being chronically occluded bilaterally therefore rationale for why we have to continue to use this patient's right femoral Vas-Cath port for parenteral nutrition   Chronic nonocclusive left lower extremity DVT  *Diagnosed via Doppler 04/30/2012 - he continues with left lower extermity edema - f/u doppler is negative   CKD (chronic kidney disease) stage V requiring chronic dialysis  *Dialysis per nephrology    HTN (hypertension) *Adequate control. I will continue current meds.  Anemia *Stable at 10.5. Due to acute blood loss and due to chronic disease - see above regarding wound bleeding  *Has received a total of 4 units of packed red blood cells this admission  *Cont aranesp and  ferric gluconate   Chronic use of steroids *Hydrocortisone now down to 25 mg daily. I will continue until  patient is able to resume his oral steroids.   Leg graft occlusion **s/p thrombectomy 7/25 - unfortunately re clotted - has undergone an additional thrombectomy procedure with revision of arterial and graft on 04/27/2012  *Was being dialyzed via HD cath in right groin but now graft is being used again  *See discussion per vascular surgery concerning narrowing at the venous anastomosis  Severe protein calorie malnutrition *Patient cachectic at presentation  *Parenteral nutrition managed by surgery and pharmacy   Disposition  * I spoke with Nephrology who reports that as long as the patient remains on TNA, he would likely require dialysis 4 x  week.  * [facility] however at present they're unable to accept the patient onto his dialysis regimen is down to 3 times per week.  Code Status: Full Family Communication: Not applicable Disposition Plan: Not yet determined  [name] A.  [facility] Pager [phone]. If 8PM-8AM, please contact night-coverage at [email or web address], password [facility] 05/18/2012, 9:19 PM  LOS: 46 days     Consultants: General surgery Nephrology Infectious disease the  Procedures: 7/24 Ex-lap, lysis of adhesions, partial small bowel resection x2  7/25 Thrombectomy L femoral AV graft  7/26 Extubated, CVL is out  7/27 Transferred to telemetry under [facility]  8/2 Acute abdomen, dehiscence of anastomosis, SBO, back from OR intubated in ICU on PCCM service. Found to have moderate peritonitis infraumbilically  8/2 Extubated   Antibiotics: Zosyn 8/1 >>>  Micafungin 8/3 >>8/16  Invanz 7/24 periop  Fluconazole- 8/2 + 8/17 >>  Vancomycin 8/8 >>> 05/01/2012   HPI/Subjective: Patient looks much better today and states that he feels better also.   Objective: Filed Vitals:   05/18/12 1400 05/18/12 1404 05/18/12 1503 05/18/12 1631  BP: 106/69 118/61 109/63 130/65  Pulse: 93 86 81 74  Temp:  97.7 F (36.5 C) 98.2 F (36.8 C)  98.2 F (36.8 C)  TempSrc:  Oral    Resp:  18 16 17   Height:      Weight:  53.1 kg (117 lb 1 oz)    SpO2:   100% 100%   Weight change: -3.3 kg (-7 lb 4.4 oz)  Intake/Output Summary (Last 24 hours) at 05/18/12 2119 Last data filed at 05/18/12 1844  Gross per 24 hour  Intake    240 ml  Output   2000 ml  Net  -1760 ml    General: Patient is very cachectic appearance. HEENT: [place]/AT PEERL, EOMI Heart: Regular rate and rhythm, without murmurs, rubs, gallops.  Lungs: Clear to auscultation. Abdomen: Soft, non-tender, positive bowel sounds nondistended. Dressing recently changed by WOC on pouch. Percutaneous drain draining well. Neuro: No focal neurological deficits noted . Musculoskeletal: No warm swelling or erythema around joints, no spinal tenderness noted. No calf tenderness noted.   Data Reviewed: Basic Metabolic Panel:  Lab 05/18/12 4540 05/17/12 0800 05/16/12 0500 05/15/12 0900 05/13/12 0845 05/12/12 0739  NA 134* 126* 129* 127* -- 134*  K 3.5 3.3* 3.5 3.5 -- 3.2*  CL 96 89* 92* 91* -- 96  CO2 24 21 25 19  -- 27  GLUCOSE 91 90 103* 88 -- 215*  BUN 47* 78* 47* 82* -- 45*  CREATININE 4.57* 5.86* 3.88* 5.40* -- 3.61*  CALCIUM 9.6 9.5 9.3 9.7 -- 9.3  MG 1.6 -- -- -- 1.5 --  PHOS 1.5* -- -- 2.3 2.7 --  Liver Function Tests:  Lab 05/18/12 0838 05/17/12 0800 05/15/12 0900  AST 37 32 --  ALT 28 26 --  ALKPHOS 135* 134* --  BILITOT 1.9* 2.1* --  PROT 5.3* 5.0* --  ALBUMIN 1.5* 1.4* 1.5*   No results found for this basename: LIPASE:5,AMYLASE:5 in the last 168 hours No results found for this basename: AMMONIA:5 in the last 168 hours CBC:  Lab 05/17/12 0800 05/15/12 0900 05/14/12 0355 05/13/12 0817  WBC 6.2 7.9 7.6 10.3  NEUTROABS 4.3 -- -- --  HGB 10.5* 9.9* 10.0* 9.6*  HCT 31.2* 29.4* 30.8* 29.0*  MCV 82.1 82.6 85.3 83.6  PLT 105* 103* 102* 99*   Cardiac Enzymes: No results found for this basename: CKTOTAL:5,CKMB:5,CKMBINDEX:5,TROPONINI:5 in the last 168  hours BNP (last 3 results) No results found for this basename: PROBNP:3 in the last 8760 hours CBG:  Lab 05/18/12 1629 05/18/12 1502 05/18/12 0751 05/18/12 0418 05/18/12 0003  GLUCAP 90 100* 105* 119* 100*    No results found for this or any previous visit (from the past 240 hour(s)).   Studies: Ct Angio Chest Pe W/cm &/or Wo Cm  04/25/2012  *RADIOLOGY REPORT*  Clinical Data: Chest pain and cough.  CT ANGIOGRAPHY CHEST  Technique:  Multidetector CT imaging of the chest using the standard protocol during bolus administration of intravenous contrast. Multiplanar reconstructed images including MIPs were obtained and reviewed to evaluate the vascular anatomy.  Contrast: OMNIPAQUE IOHEXOL 350 MG/ML SOLN  Comparison: None.  Findings: Technically adequate study with good opacification of the central and segmental pulmonary arteries.  No focal filling defects.  No evidence of significant pulmonary embolus.  Normal heart size.  Normal caliber thoracic aorta without dissection. Aortic and coronary artery calcifications.  Enteric tube with tip below the level of the stomach, not visualized.  Small amount of mucous in the trachea.  Airways generally appear patent.  There is a small left pleural effusion with basilar atelectasis or consolidation on the left.  Vague patchy infiltration in the left upper lung may represent edema or pneumonia.  No pneumothorax. There is pneumoperitoneum noted in the upper abdomen.  This likely results from recent surgery with noted laparotomy on 04/22/2012. Normal alignment of the thoracic spine.  IMPRESSION: No evidence of significant pulmonary embolus or aortic dissection. Small left pleural effusion with basilar atelectasis or consolidation.  Vague patchy infiltration in the left upper lung. Abdominal pneumoperitoneum consistent with recent surgery.  Original Report Authenticated By: Marlon Pel, M.D.   Ct Guided Abscess Drain  04/30/2012  *RADIOLOGY REPORT*   Indication: Post surgery for small bowel obstruction, now with anastomotic leak and large intra-abdominal abscess.  CT GUIDED RIGHT LOWER QUADRANT ABDOMINAL DRAINAGE CATHETER PLACEMENT  Comparison: CT abdomen pelvis - 04/29/2012  Medications: Fentanyl 75 mcg IV; Versed 1.5 mg IV  Total Moderate Sedation time: 20 minutes  Contrast: None  Complications: None immediate  Technique / Findings:  Informed written consent was obtained from the patient after a discussion of the risks, benefits and alternatives to treatment. The patient was placed supine on the CT gantry and a pre procedural CT was performed re-demonstrating the known abscess/fluid collection within the right lower abdominal quadrant.  The procedure was planned.   A timeout was performed prior to the initiation of the procedure.  The skin overlying the right lower abdomen was prepped and draped in the usual sterile fashion.   The overlying soft tissues were anesthetized with 1% lidocaine with epinephrine.  An 18 gauge trocar  needle was advanced in to the abscess/fluid collection and a short Amplatz super stiff wire was coiled within the abscess/fluid collection.   Appropriate positioning was confirmed with a limited CT scan.  The tract was serially dilated allowing placement of a 10 [place] all-purpose drainage catheter.  Appropriate positioning was confirmed with a limited postprocedural CT scan.  200 ml of bloody, slightly purulent fluid was aspirated.  The tube was connected to a drainage bag and sutured in place.  A dressing was placed.  The patient tolerated the procedure well without immediate post procedural complication.  Impression:  Successful CT guided placement of a 10 [place] all purpose drain catheter into the right lower abdomen with aspiration of 200 mL of bloody, slightly purulent fluid.  Samples were sent to the laboratory as requested by the ordering clinical team.  Original Report Authenticated By: Waynard Reeds, M.D.   Ct Abdomen Pelvis  W Contrast  05/04/2012  *RADIOLOGY REPORT*  Clinical Data: Status post surgery to treat the small bowel obstruction.  A right lower quadrant abscess was treated with percutaneous drainage on 04/30/2012.  CT ABDOMEN AND PELVIS WITH CONTRAST  Technique:  Multidetector CT imaging of the abdomen and pelvis was performed following the standard protocol during bolus administration of intravenous contrast.  Contrast:  75 ml Omnipaque-300 IV  Comparison: 04/30/2012 and 04/29/2012  Findings: Visualized lung bases show a left basilar pleural effusion and consolidation/atelectasis of the posterior left lower lobe.  There is extravasation of oral contrast from the small bowel into a residual abscess cavity within the right lower quadrant of the abdomen.  The percutaneous drain is located in the posterior/dependent aspect of the cavity and shows some surrounding debris which may be acting to occlude/partially occlude the catheter.  The abscess cavity remains of significant size, measuring over 10 cm in greatest dimensions.  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  Consider repositioning/upsizing of the current percutaneous drain or placement of a second drain into the more anterior aspect of the residual collection.  No new abscess collections are identified. There is no evidence of gross free intraperitoneal air.  Solid organs of the abdomen are stable in appearance.  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 Indwelling percutaneous drain lies in the dependent portion of this collection which  may contain debris obstructing the catheter. Given the large residual collection present, further drain manipulation including potential repositioning and upsizing of the current drain and also potentially placement of a second more anteriorly positioned drain may be helpful.  This will be communicated to the interventional Radiology service.  Original Report Authenticated By: Reola Calkins, M.D.   Ct Abdomen Pelvis W Contrast  04/29/2012  *RADIOLOGY REPORT*  Clinical Data: Post surgery for small bowel obstruction in an anastomotic leak.  Leukocytosis.  Dialysis patient.  CT ABDOMEN AND PELVIS WITH CONTRAST  Technique:  Multidetector CT imaging of the abdomen and pelvis was performed following the standard protocol during bolus administration of intravenous contrast.  Contrast: OMNIPAQUE IOHEXOL 300 MG/ML  SOLN  Comparison: 04/03/2012  Findings: New small left pleural  effusion with basilar atelectasis or consolidation on the left.  Enteric tube with tip in the distal stomach.  The liver and spleen appear homogeneous without enlargement.  Portal and mesenteric vessels appear patent.  The gallbladder is moderately distended and is filled with increased density material suggesting vicarious contrast excretion. Pancreatic duct is diffusely dilated as seen previously.  No obstructing mass or stone is visualized.  Consider MRCP for further evaluation.  Extensive calcification of the abdominal aorta, iliac vessels, and splenic artery.  Calcification of the mesenteric artery with flow demonstrated.  Left adrenal gland nodule measuring 14 mm diameter.  This appears stable since the previous study but density measures are indeterminate.  No significant retroperitoneal lymphadenopathy.  Bilateral renal atrophy with multiple bilateral renal cysts.  No hydronephrosis.  The stomach, small bowel, and colon are decompressed.  No residual small bowel obstruction. Interval postoperative changes with midline surgical  defect along the anterior abdominal wall and skin clips in the left groin region.  There is interval development of a large gas and fluid collections in the right pelvis extending from anteriorly to posteriorly and extending up into the right lower quadrant.  This collection measures up to about 16.5 x 13.7 x 6.4 cm.  The collection has a mildly thickened enhancing wall and air-fluid levels are present.  The appearance is consistent with loculated fluid or abscess.  There are additional loculated fluid collections in the anterior right lower quadrant measuring 3.3 x .9 centimeters and between the medial ascending colon and psoas muscle measuring 2.6 x 2.6 cm.  Sulci are consistent with abscesses.  Probably loculated fluid collection at the tip of the liver inferiorly. Infiltration into the mesenteric fat in the upper abdomen around the spleen and liver may represent edema.  No loculation is apparent.  Pelvis:  Pelvic transplant kidney with diffuse atrophy and prominent renal sinus fat.  No hydronephrosis.  Vascular calcification in the graft.  There  is a vascular graft extending from the common femoral vein inferiorly and from the common femoral artery laterally on the left.  Extensive calcification of the native femoral arteries bilaterally with suggestion of significant stenosis of the right common femoral artery.  Edema in the subcutaneous soft tissues particularly on the left.  Normal alignment of the lumbar vertebra.  IMPRESSION: Interval postoperative changes.  No residual bowel obstruction. Multiple loculated fluid collections including a very large right abdominal and pelvic collection consistent with abscesses. Vicarious contrast excretion into the gallbladder.  New left pleural effusion and basilar atelectasis.  Additional incidental findings are stable since the previous study.  Results were telephoned to and Marchelle Folks, the patient's nurse on [facility]- [id number] at the time of dictation, 2129 hours on 04/29/2012.   Original Report Authenticated By: Marlon Pel, M.D.   Ir Catheter Tube Change  05/05/2012  *RADIOLOGY REPORT*  Clinical history:51 year old with multiple medical problems including a small bowel leak.  The patient has a right lower quadrant drain which is incompletely draining the large abscess collection.  PROCEDURE(S): EXCHANGE AND REPOSITIONING OF THE PERCUTANEOUS DRAINAGE CATHETER WITH FLUOROSCOPY  Physician: Rachelle Hora. Henn, MD  Medications:Versed 2 mg, Fentanyl 25 mcg. A radiology nurse monitored the patient for moderate sedation.  Moderate sedation time:22 minutes  Fluoroscopy time: 3.8 minutes  Procedure:Informed consent was obtained for a drain repositioning and exchange.  The patient was placed supine on the interventional table.  The existing catheter in the right lower quadrant was prepped and draped in a sterile fashion.  Maximal barrier sterile  technique was utilized including caps, mask, sterile gowns, sterile gloves, sterile drape, hand hygiene and skin antiseptic.  Catheter was injected with contrast and the catheter was cut and removed over a Bentson wire.  A Kumpe catheter was advanced into the upper portion of the collection.  Position was confirmed with ultrasound. A 12-French biliary drain was advanced over a Bentson wire and into the superior aspect the collection.  Greater than 200 ml of dark brown fluid was removed.  Catheter sutured to the skin and attached to a gravity bag.  Findings:The old catheter was located within the inferior posterior aspect of the abscess collection.  Adjacent to the collection, there is high density material which is probably related to the small bowel fistula or leak.  Catheter and wire were advanced into the anterior and superior aspect of the collection.  Biliary drain was also advanced into the superior aspect of the collection. Greater than 200 ml of dark brown fluid was removed.  Complications: None  Impression:Successful exchange and repositioning of  the drainage catheter.  The large abscess was successfully decompressed following placement of the new catheter.  Original Report Authenticated By: Richarda Overlie, M.D.   Ir Fluoro Guide Cv Line Left  04/23/2012  *RADIOLOGY REPORT*  Clinical Data/Indication: BOWEL OBSTRUCTION.  CENTRAL VENOUS ACCESS REQUESTED.  IR LEFT FLOURO GUIDE CV LINE  Fluoroscopy Time: 2.5 minutes.  Procedure: The procedure, risks, benefits, and alternatives were explained to the patient. Questions regarding the procedure were encouraged and answered. The patient understands and consents to the procedure.  The right neck was prepped with betadine in a sterile fashion, and a sterile drape was applied covering the operative field. A sterile gown and sterile gloves were used for the procedure.  Sonographic imaging over the right neck demonstrate a collateral venous structures only without a viable large central venous structure.  Under sonographic guidance, a micropuncture needle was inserted into one of the collateral vessels and removed over a 0018 wire.  The wire could not be advanced centrally.  Central venous access from the right neck was aborted.  Subsequently, under sonographic guidance, a micropuncture needle was inserted into the left internal jugular vein and removed over a 0.8 wire.  A 3-French dilator was inserted.  Central venography confirms central venous occlusion.  Findings: Central venous occlusion into the SVC is demonstrated. Center venous access was not placed.  Complications: None.  IMPRESSION: Central venous access was not placed.  Upper extremity and internal jugular vein center venous access is chronically occluded.  Original Report Authenticated By: Donavan Burnet, M.D.   Ir US Guide Vasc Access Left  04/23/2012  Please refer to accession number [id number].  Original Report Authenticated By: Donavan Burnet, M.D.   Dg Chest Port 1 View  05/05/2012  *RADIOLOGY REPORT*  Clinical Data: Chest pain.  PORTABLE CHEST - 1 VIEW   Comparison: 04/28/2012  Findings: Nasogastric tube extends at least as   the   stomach, tip not seen.  Left lower lung consolidation / atelectasis has partially improved since the previous exam.  Right lung remains clear.  Probable small left pleural effusion.  Regional bones unremarkable.  IMPRESSION:  1.  Slight improvement in the left lower lung consolidation / atelectasis, with persistent small effusion.  Original Report Authenticated By: Osa Craver, M.D.   Dg Chest Port 1 View  04/28/2012  *RADIOLOGY REPORT*  Clinical Data: Progressive leukocytosis.  PORTABLE CHEST - 1 VIEW  Comparison: Chest x-ray dated 04/23/2012 and chest  CT dated 04/25/2012  Findings: There is a persistent moderate left effusion with compressive atelectasis in the left lower lobe.  Heart size and vascularity are normal.  Right lung is clear.  NG tube tip is below the diaphragm.  IMPRESSION: Progressive left pleural effusion and left lower lobe atelectasis.  Original Report Authenticated By: Gwynn Burly, M.D.   Dg Chest Port 1 View  04/23/2012  *RADIOLOGY REPORT*  Clinical Data: Endotracheal tube placed  PORTABLE CHEST - 1 VIEW  Comparison: 04/15/2012  Findings: Endotracheal tube tip 7.2 cm proximal to the carina. Interval removal of the left IJ catheter.  Hyperinflated lungs. Mild retrocardiac opacity and left hemidiaphragm elevation.  No pneumothorax.  A enteric tube descends into the abdomen, tip not visualized.  There is nonspecific metallic density projecting over the left upper quadrant of the abdomen.  No acute osseous finding.  IMPRESSION: Endotracheal and enteric tubes as above.  Retrocardiac opacity; atelectasis, aspiration, or infiltrate.  Hyperinflation.  Original Report Authenticated By: Waneta Martins, M.D.   Dg Abd 2 Views  04/20/2012  *RADIOLOGY REPORT*  Clinical Data: Postop abdominal distention, recent of abdominal surgery for small bowel obstruction and lysis of the patient with partial small  bowel resection  ABDOMEN - 2 VIEW  Comparison: Portable abdomen film of 04/16/2012  Findings: Both large and small bowel gas is present which may indicate postoperative ileus.  However there does appear be more distention of small bowel loops and a partial small bowel obstruction cannot be excluded.  Some contrast is scattered throughout the bowel.  Arterial calcifications are noted.  IMPRESSION: Distention of both large and small bowel may indicate ileus, but a partial small bowel obstruction cannot be excluded.  Original Report Authenticated By: Juline Patch, M.D.   Dg Abd Portable 1v  04/28/2012  *RADIOLOGY REPORT*  Clinical Data: Abdominal pain.  Ileus.  Progressive leukocytosis.  PORTABLE ABDOMEN - 1 VIEW  Comparison: Radiographs dated 04/20/2012  Findings: NG tube tip is in the body of the stomach.  There are no dilated loops of bowel.  Double-lumen catheter is in the right femoral vein with the tips in the inferior vena cava.  Contrast is seen in the gallbladder.  There is also scattered high density material in the bowel.  IMPRESSION: No acute abnormalities.  Original Report Authenticated By: Gwynn Burly, M.D.   Dg Abd Portable 1v  04/16/2012  *RADIOLOGY REPORT*  Clinical Data: No bowel activity since exploratory laparoscopy 2 days ago question ileus, right lower quadrant pain  PORTABLE ABDOMEN - 1 VIEW  Comparison: 04/13/2012  Findings: Nasogastric tube in stomach. Retained contrast in colon. Surgical clips in pelvis bilaterally. Scattered atherosclerotic calcifications. No bowel dilatation, bowel wall thickening, or evidence of obstruction. Bowel staple lines noted in right mid abdomen.  IMPRESSION: Nonobstructive bowel gas pattern. Resolution of small bowel distention since prior exam.  Original Report Authenticated By: Lollie Marrow, M.D.   Ir Shuntogram/ Fistulagram Left Mod Sed  05/10/2012  *RADIOLOGY REPORT*  Clinical Data: Left leg swelling.  Graft revision 2 weeks ago.  AV SHUNTOGRAM   Procedure:  The left thigh was prepped and draped in a sterile fashion.  An 18 gauge Angiocath was inserted into the AV graft. Contrast was injected.  The Angiocath was removed.  Hemostasis was achieved with direct pressure. No complication.  Findings: The arterial anastomosis and graft are widely patent. There is moderate narrowing at the venous anastomosis.  There is associated narrowing of the external iliac vein.  Central venous structures are otherwise patent  IMPRESSION: Venous anastomotic and external iliac vein narrowing.  Otherwise patent circuit.   Original Report Authenticated By: Donavan Burnet, M.D. ( 05/10/2012 13:05:15 )     Scheduled Meds:    . antiseptic oral rinse  15 mL Mouth Rinse q12n4p  . chlorhexidine  15 mL Mouth Rinse BID  . darbepoetin (ARANESP) injection - DIALYSIS  200 mcg Intravenous Q Sat-HD  . fentaNYL  37.5 mcg Transdermal Q72H  . fluconazole (DIFLUCAN) IV  400 mg Intravenous Q T,Th,Sa-HD  . heparin  40 Units/kg Dialysis Once in dialysis  . hydrocortisone sod succinate (SOLU-CORTEF) injection  25 mg Intravenous Daily  . insulin aspart  0-15 Units Subcutaneous Q8H  . lip balm   Topical BID  . metoprolol  5 mg Intravenous Q6H  . pantoprazole  40 mg Oral Q1200  . piperacillin-tazobactam (ZOSYN)  IV  2.25 g Intravenous Q8H  . sodium chloride  10-40 mL Intracatheter Q12H  . sodium glycerophosphate 0.9% NaCl IVPB  10 mmol Intravenous Once  . DISCONTD: insulin aspart  0-15 Units Subcutaneous Q4H  . DISCONTD: sodium phosphate  Dextrose 5% IVPB  10 mmol Intravenous Once   Continuous Infusions:    . fat emulsion 240 mL (05/17/12 1801)  . TPN (CLINIMIX) +/- additives 70 mL/hr at 05/17/12 1800  . TPN (CLINIMIX) +/- additives 70 mL/hr at 05/18/12 1723    Principal Problem:  *SBO (small bowel obstruction) s/p EL/LOA and SBR x 2 Active Problems:  CKD (chronic kidney disease) stage V requiring chronic dialysis  Thrombocytopenia  Gout  HTN (hypertension)  Ileus  following gastrointestinal surgery  Chronic use of steroids  Malnutrition, calorie  Leg graft occlusion  Acute respiratory failure with hypoxia  Sinus tachycardia  Abscess of abdominal cavity  Wound dehiscence  Bleeding  Anemia

## 2012-05-18 NOTE — Progress Notes (Signed)
Off unit.

## 2012-05-18 NOTE — Progress Notes (Signed)
PARENTERAL NUTRITION CONSULT NOTE - FOLLOW UP  Pharmacy Consult for TPN Indication: SBO with new EC fistula  Allergies  Allergen Reactions  . Allopurinol     REACTION: decreased platelets  . Aspirin     REACTION: unspecified    Patient Measurements: Height: 5\' 11"  (180.3 cm) Weight: 120 lb 9.5 oz (54.7 kg) IBW/kg (Calculated) : 75.3    Vital Signs: Temp: 97.4 F (36.3 C) (08/27 1014) Temp src: Oral (08/27 1014) BP: 143/70 mmHg (08/27 1033) Pulse Rate: 69  (08/27 1033) Intake/Output from previous day: 08/26 0701 - 08/27 0700 In: 490 [P.O.:480] Out: 3062 [Urine:20; Drains:25] Intake/Output from this shift:    Labs:  Basename 05/17/12 0800  WBC 6.2  HGB 10.5*  HCT 31.2*  PLT 105*  APTT --  INR --     Basename 05/18/12 0838 05/17/12 0800 05/16/12 0500  NA 134* 126* 129*  K 3.5 3.3* 3.5  CL 96 89* 92*  CO2 24 21 25   GLUCOSE 91 90 103*  BUN 47* 78* 47*  CREATININE 4.57* 5.86* 3.88*  LABCREA -- -- --  CREAT24HRUR -- -- --  CALCIUM 9.6 9.5 9.3  MG 1.6 -- --  PHOS 1.5* -- --  PROT 5.3* 5.0* --  ALBUMIN 1.5* 1.4* --  AST 37 32 --  ALT 28 26 --  ALKPHOS 135* 134* --  BILITOT 1.9* 2.1* --  BILIDIR -- -- --  IBILI -- -- --  PREALBUMIN -- 23.4 --  TRIG -- 97 --  CHOLHDL -- -- --  CHOL -- 138 --   Estimated Creatinine Clearance: 14.6 ml/min (by C-G formula based on Cr of 4.57).    Basename 05/18/12 0751 05/18/12 0418 05/18/12 0003  GLUCAP 105* 119* 100*   Insulin Requirements in the past 24 hours:  2 units Novolog SSI and 15 units regular insulin in TNA  Nutritional Goals:  1700-1950 kCal, 80-100 grams of protein per day   Current Nutrition:  Clinimix 5/15 at 70 ml/hr with 20% lipids at 10 ml/hr MWF provides an average of 1398 kcal and 84g protein per day  Admit: Admitted 7/13 with abd pain, weight loss, SBO. S/p LOA, SBR 7/24 and repeat LOA, SBR with anasomotic leak repair 8/1, thrombectomy with AVG revision 7/25 and repeat thrombectomy 8/6. CT  on 8/13 reveals enlarging abscess and enteric fistula.  GI: NGT out, Drains continue with significant drainage and bloody drainage. Tolerating TPN. Pt allowed to have ice chips and sips of water. Per RD note, pt is tolerating ice chips but is reluctant to pursue water/clears 2/2 fear of emesis or abdominal pain. Endo: CBGs well controlled with decrease in TPN insulin yesterday. Solu-Cortef daily.  Lytes: Na low but better past extra HDs to help with volume overload and "dilute" TPN. K 3.5, Phos 1.5 - spoke with Renal PA, [name], and he will discuss Phos replacment with Dr. Arlean Hopping. Hypercalcemia (corrected Ca 11.6, Ca x PO4 remains < 55),  Renal: ESRD with HD typically TTS. HD is currently being administered prn d/t volume issues - received HD 8/19,20,22, 24, 26, 27. Now to restart TTS HD. On Aranesp qSat.  Cards: BP mildly elevated (had hypotension during HD). HR controlled. On scheduled lopressor and prn hydralazine.  Hepatobil: LFTs WNL 8/27 except alk phos elevated. Prealbumin remains at goal. Trig WNL.  Neuro: GCS 15, no c/o pain - on fentanyl patch, prn pain meds.  ID: Fluconazole + Zosyn for peritonitis/abd abscess -- ID rec-cont current regimen for another mos with repeat  CT scan to verify abscess smaller. With fistulas present, per ID may need indefinite abx with drains in place. Afeb, WBC wnl. Abd abscess culture: candida tropicalis.  Zosyn 8.1>>  Diflucan 8.16>> (s/p 14 d mycamine) Best Practices: Mouthcare, PPI po  Plan:  - Continue Clinimix 5/15 (no lytes) @ 70 ml/hr to avoid exacerbating volume status. MD/RD aware TPN not meeting kcal goals with Clinimix alone. May need to consider increasing TPN rate if pt unable to take po's soon. - Will change SSI and CBGs to q8h. - Provide available trace elements, MVI and lipids @10cc /hr on MWF only d/t national backorder   Thanks, Christoper Fabian, PharmD, BCPS Clinical pharmacist, pager [phone] 05/18/2012 10:44 AM

## 2012-05-18 NOTE — Progress Notes (Signed)
Subjective:  On hd , no cos/ " Did pass some Urine yesterday"  Objective Vital signs in last 24 hours: Filed Vitals:   05/18/12 1033 05/18/12 1100 05/18/12 1130 05/18/12 1200  BP: 143/70 136/70 148/82 121/71  Pulse: 69 73 90 81  Temp:      TempSrc:      Resp: 15     Height:      Weight:      SpO2:       Weight change: -3.3 kg (-7 lb 4.4 oz)  Intake/Output Summary (Last 24 hours) at 05/18/12 1234 Last data filed at 05/18/12 1014  Gross per 24 hour  Intake    490 ml  Output     45 ml  Net    445 ml   Labs: Basic Metabolic Panel:  Lab 05/18/12 1610 05/17/12 0800 05/16/12 0500 05/15/12 0900 05/13/12 0845  NA 134* 126* 129* -- --  K 3.5 3.3* 3.5 -- --  CL 96 89* 92* -- --  CO2 24 21 25  -- --  GLUCOSE 91 90 103* -- --  BUN 47* 78* 47* -- --  CREATININE 4.57* 5.86* 3.88* -- --  CALCIUM 9.6 9.5 9.3 -- --  ALB -- -- -- -- --  PHOS 1.5* -- -- 2.3 2.7   Liver Function Tests:  Lab 05/18/12 0838 05/17/12 0800 05/15/12 0900  AST 37 32 --  ALT 28 26 --  ALKPHOS 135* 134* --  BILITOT 1.9* 2.1* --  PROT 5.3* 5.0* --  ALBUMIN 1.5* 1.4* 1.5*   No results found for this basename: LIPASE:3,AMYLASE:3 in the last 168 hours No results found for this basename: AMMONIA:3 in the last 168 hours CBC:  Lab 05/17/12 0800 05/15/12 0900 05/14/12 0355 05/13/12 0817  WBC 6.2 7.9 7.6 --  NEUTROABS 4.3 -- -- --  HGB 10.5* 9.9* 10.0* --  HCT 31.2* 29.4* 30.8* --  MCV 82.1 82.6 85.3 83.6  PLT 105* 103* 102* --   Cardiac Enzymes: No results found for this basename: CKTOTAL:5,CKMB:5,CKMBINDEX:5,TROPONINI:5 in the last 168 hours CBG:  Lab 05/18/12 0751 05/18/12 0418 05/18/12 0003 05/17/12 2058 05/17/12 1657  GLUCAP 105* 119* 100* 123* 118*    Iron Studies: No results found for this basename: IRON,TIBC,TRANSFERRIN,FERRITIN in the last 72 hours Studies/Results: No results found. Medications:    . fat emulsion 240 mL (05/17/12 1801)  . TPN (CLINIMIX) +/- additives 70 mL/hr at 05/16/12  1742  . TPN (CLINIMIX) +/- additives 70 mL/hr at 05/17/12 1800  . TPN (CLINIMIX) +/- additives        . antiseptic oral rinse  15 mL Mouth Rinse q12n4p  . chlorhexidine  15 mL Mouth Rinse BID  . darbepoetin (ARANESP) injection - DIALYSIS  200 mcg Intravenous Q Sat-HD  . fentaNYL  37.5 mcg Transdermal Q72H  . fluconazole (DIFLUCAN) IV  200 mg Intravenous Once  . fluconazole (DIFLUCAN) IV  400 mg Intravenous Q T,Th,Sa-HD  . heparin  40 Units/kg Dialysis Once in dialysis  . hydrocortisone sod succinate (SOLU-CORTEF) injection  25 mg Intravenous Daily  . insulin aspart  0-15 Units Subcutaneous Q8H  . lip balm   Topical BID  . metoprolol  5 mg Intravenous Q6H  . pantoprazole  40 mg Oral Q1200  . piperacillin-tazobactam (ZOSYN)  IV  2.25 g Intravenous Q8H  . sodium chloride  10-40 mL Intracatheter Q12H  . sodium glycerophosphate 0.9% NaCl IVPB  10 mmol Intravenous Once  . DISCONTD: insulin aspart  0-15 Units Subcutaneous Q4H  .  DISCONTD: sodium phosphate  Dextrose 5% IVPB  10 mmol Intravenous Once   I  have reviewed scheduled and prn medications.    Physical Exam:  General: NAD,alert, cooperative, and cachectic  Heart: RRR, 1/6 hsm At apex  Lungs: CTA bilaterally  Abdomen: positive bs, Ostomy bag middle abd. over wound, soft nintender  Extremities: Dialysis Access: Left groin avgg patent on hd and righ t groin iv cental line   Problem/Plan:  1. SBO sp surg/dehisc/abscess= on Zosyn, Diflucan, CCS managing 2. ESRD - hd 4x per week while on TNA (mtts) Using NO heparin as CCS recommending 3. Anemia -10.5 hgb on aranesp 200 no iron 4. HPTH- phos low due to extra HD, replace IV w NaPhos. No binders 5. HTN/volume - max uf 3-4 kg each HD 4x/wk 6. Malnutrition = On TNA  7. GOUT= stable No pain on Solu-Cortef  [name], PA-C [facility]per [phone] 05/18/2012,12:34 PM  LOS: 46 days   Patient seen and examined and agree with assessment and plan as above.  Vinson Moselle  MD [employer] [phone] pgr    [phone] cell 05/18/2012, 2:57 PM

## 2012-05-19 LAB — GLUCOSE, CAPILLARY
Glucose-Capillary: 150 mg/dL — ABNORMAL HIGH (ref 70–99)
Glucose-Capillary: 94 mg/dL (ref 70–99)

## 2012-05-19 LAB — PHOSPHORUS: Phosphorus: 1.9 mg/dL — ABNORMAL LOW (ref 2.3–4.6)

## 2012-05-19 MED ORDER — FLUCONAZOLE IN SODIUM CHLORIDE 400-0.9 MG/200ML-% IV SOLN
400.0000 mg | INTRAVENOUS | Status: DC
  Administered 2012-05-20: 400 mg via INTRAVENOUS
  Filled 2012-05-19: qty 200

## 2012-05-19 MED ORDER — ZINC TRACE METAL 1 MG/ML IV SOLN
INTRAVENOUS | Status: AC
  Administered 2012-05-19: 18:00:00 via INTRAVENOUS
  Filled 2012-05-19: qty 2000

## 2012-05-19 MED ORDER — FAT EMULSION 20 % IV EMUL
240.0000 mL | INTRAVENOUS | Status: AC
  Administered 2012-05-19: 240 mL via INTRAVENOUS
  Filled 2012-05-19: qty 250

## 2012-05-19 NOTE — Progress Notes (Signed)
Subjective:  No cos. Tolerated hd yesterday on 4 day schedule sec to tpn Objective Vital signs in last 24 hours: Filed Vitals:   05/18/12 2119 05/19/12 0024 05/19/12 0501 05/19/12 1255  BP: 168/82 163/74 165/82 158/69  Pulse: 73 76 80 67  Temp: 98.1 F (36.7 C) 98.3 F (36.8 C) 98.6 F (37 C)   TempSrc: Oral Oral Oral   Resp: 18 18 18    Height: 5\' 11"  (1.803 m)     Weight: 53.1 kg (117 lb 1 oz)     SpO2: 100% 100% 100%    Weight change: 2.4 kg (5 lb 4.7 oz)  Intake/Output Summary (Last 24 hours) at 05/19/12 1426 Last data filed at 05/19/12 0300  Gross per 24 hour  Intake    570 ml  Output      0 ml  Net    570 ml   Labs: Basic Metabolic Panel:  Lab 05/19/12 1191 05/18/12 0838 05/17/12 0800 05/16/12 0500 05/15/12 0900  NA -- 134* 126* 129* --  K -- 3.5 3.3* 3.5 --  CL -- 96 89* 92* --  CO2 -- 24 21 25  --  GLUCOSE -- 91 90 103* --  BUN -- 47* 78* 47* --  CREATININE -- 4.57* 5.86* 3.88* --  CALCIUM -- 9.6 9.5 9.3 --  ALB -- -- -- -- --  PHOS 1.9* 1.5* -- -- 2.3   Liver Function Tests:  Lab 05/18/12 0838 05/17/12 0800 05/15/12 0900  AST 37 32 --  ALT 28 26 --  ALKPHOS 135* 134* --  BILITOT 1.9* 2.1* --  PROT 5.3* 5.0* --  ALBUMIN 1.5* 1.4* 1.5*   No results found for this basename: LIPASE:3,AMYLASE:3 in the last 168 hours No results found for this basename: AMMONIA:3 in the last 168 hours CBC:  Lab 05/17/12 0800 05/15/12 0900 05/14/12 0355 05/13/12 0817  WBC 6.2 7.9 7.6 --  NEUTROABS 4.3 -- -- --  HGB 10.5* 9.9* 10.0* --  HCT 31.2* 29.4* 30.8* --  MCV 82.1 82.6 85.3 83.6  PLT 105* 103* 102* --   Cardiac Enzymes: No results found for this basename: CKTOTAL:5,CKMB:5,CKMBINDEX:5,TROPONINI:5 in the last 168 hours CBG:  Lab 05/19/12 0815 05/19/12 0026 05/18/12 2123 05/18/12 1629 05/18/12 1502  GLUCAP 94 150* 142* 90 100*    Iron Studies: No results found for this basename: IRON,TIBC,TRANSFERRIN,FERRITIN in the last 72 hours Studies/Results: No results  found. Medications:    . fat emulsion 240 mL (05/17/12 1801)  . fat emulsion    . TPN (CLINIMIX) +/- additives 70 mL/hr at 05/17/12 1800  . TPN (CLINIMIX) +/- additives 70 mL/hr at 05/18/12 1723  . TPN (CLINIMIX) +/- additives        . antiseptic oral rinse  15 mL Mouth Rinse q12n4p  . chlorhexidine  15 mL Mouth Rinse BID  . darbepoetin (ARANESP) injection - DIALYSIS  200 mcg Intravenous Q Sat-HD  . fentaNYL  37.5 mcg Transdermal Q72H  . fluconazole (DIFLUCAN) IV  400 mg Intravenous Custom  . heparin  40 Units/kg Dialysis Once in dialysis  . hydrocortisone sod succinate (SOLU-CORTEF) injection  25 mg Intravenous Daily  . insulin aspart  0-15 Units Subcutaneous Q8H  . lip balm   Topical BID  . metoprolol  5 mg Intravenous Q6H  . pantoprazole  40 mg Oral Q1200  . piperacillin-tazobactam (ZOSYN)  IV  2.25 g Intravenous Q8H  . sodium chloride  10-40 mL Intracatheter Q12H  . sodium glycerophosphate 0.9% NaCl IVPB  10 mmol  Intravenous Once  . DISCONTD: fluconazole (DIFLUCAN) IV  400 mg Intravenous Q T,Th,Sa-HD   I  have reviewed scheduled and prn medications.  Physical Exam: General: NAD,alert, cooperative, and cachectic Adult BM Heart: RRR, 1/6 hsm At apex  Lungs: CTA bilaterally  Abdomen: positive bs, Ostomy bag middle abd. over wound, soft nontender  Extremities: Dialysis Access: Left groin avgg patent on hd and right groin with central lines   Problem/Plan:  1. SBO sp surg/dehisc/abscess= on Zosyn, Diflucan, CCS managing, requiring TNA 2. ESRD - no hep HD per surg request; think we can do 3x/wk HD so pt will be accepted at rehab ctr 3. Anemia -10.5 hgb on aranesp 200 no iron 4. HPTH- phos low, 1.5yesterday due to extra HD, replace IV w NaPhos. Today 1.9, No binders, rechecking lab in am/ no Zemplar 5. HTN/volume - max uf 3-4 kg each HD 4x/wk       6.  Malnutrition = On TNA        7.  GOUT= stable No pain on Solu-Cortef   [name], PA-C [facility] [phone] 05/19/2012,2:26 PM  LOS: 47 days   Patient seen and examined and agree with assessment and plan as above with additions as indicated.  Vinson Moselle  MD [employer] [phone] pgr    [phone] cell 05/19/2012, 3:41 PM

## 2012-05-19 NOTE — Progress Notes (Signed)
Continue current management.  Wilmon Arms. Corliss Skains, MD, [facility] Surgery  05/19/2012 9:16 AM

## 2012-05-19 NOTE — Progress Notes (Addendum)
PARENTERAL NUTRITION CONSULT NOTE - FOLLOW UP  Pharmacy Consult for TPN Indication: SBO with new EC fistula  Allergies  Allergen Reactions  . Allopurinol     REACTION: decreased platelets  . Aspirin     REACTION: unspecified    Patient Measurements: Height: 5\' 11"  (180.3 cm) Weight: 117 lb 1 oz (53.1 kg) IBW/kg (Calculated) : 75.3    Vital Signs: Temp: 98.6 F (37 C) (08/28 0501) Temp src: Oral (08/28 0501) BP: 165/82 mmHg (08/28 0501) Pulse Rate: 80  (08/28 0501) Intake/Output from previous day: 08/27 0701 - 08/28 0700 In: 570 [P.O.:120; IV Piggyback:450] Out: 2000  Intake/Output from this shift:    Labs:  [facility] 05/17/12 0800  WBC 6.2  HGB 10.5*  HCT 31.2*  PLT 105*  APTT --  INR --     Basename 05/18/12 0838 05/17/12 0800  NA 134* 126*  K 3.5 3.3*  CL 96 89*  CO2 24 21  GLUCOSE 91 90  BUN 47* 78*  CREATININE 4.57* 5.86*  LABCREA -- --  CREAT24HRUR -- --  CALCIUM 9.6 9.5  MG 1.6 --  PHOS 1.5* --  PROT 5.3* 5.0*  ALBUMIN 1.5* 1.4*  AST 37 32  ALT 28 26  ALKPHOS 135* 134*  BILITOT 1.9* 2.1*  BILIDIR -- --  IBILI -- --  PREALBUMIN -- 23.4  TRIG -- 97  CHOLHDL -- --  CHOL -- 138   Estimated Creatinine Clearance: 14.2 ml/min (by C-G formula based on Cr of 4.57).    Basename 05/19/12 0815 05/19/12 0026 05/18/12 2123  GLUCAP 94 150* 142*   Insulin Requirements in the past 24 hours:  2 units Novolog SSI and 15 units regular insulin in TNA  Nutritional Goals:  1700-1950 kCal, 80-100 grams of protein per day   Current Nutrition:  Clinimix 5/15 at 70 ml/hr with 20% lipids at 10 ml/hr MWF provides an average of 1398 kcal and 84g protein per day  Admit: Admitted 7/13 with abd pain, weight loss, SBO. S/p LOA, SBR 7/24 and repeat LOA, SBR with anasomotic leak repair 8/1, thrombectomy with AVG revision 7/25 and repeat thrombectomy 8/6. CT on 8/13 reveals enlarging abscess and enteric fistula.  GI: NGT out. Drains continue with significant  drainage and bloody drainage. Tolerating TPN. Pt allowed to have ice chips and sips of water but doesn't appear to be any current plans to advance po diet. Pt's weight appears to be relatively stable. Endo: CBGs well controlled. Solu-Cortef daily.  Lytes: No lytes today. Phos 1.9 - still low s/p Phos replacement yesterday. Noted renal MD/PA to address today. Hypercalcemia (corrected Ca 11.6, Ca x PO4 remains < 55),  Renal: ESRD with HD typically TTS. HD is currently being administered prn d/t volume issues - received HD 8/19,20,22, 24, 26, 27. Plan for 4x/week HD while pt requiring TPN. On Aranesp qSat.  Cards: BP mildly elevated (but with hypotension during HD). HR controlled. On scheduled lopressor and prn hydralazine.  Hepatobil: LFTs WNL 8/27 except alk phos elevated. Prealbumin remains at goal. Trig WNL.  Neuro: GCS 15, no c/o pain - on fentanyl patch, prn pain meds.  ID: Fluconazole + Zosyn for peritonitis/abd abscess -- ID rec-cont current regimen for another mos with repeat CT scan to verify abscess smaller. With fistulas present, per ID may need indefinite abx with drains in place. Afeb, WBC wnl. Abd abscess culture: candida tropicalis.  Zosyn 8.1>>  Diflucan 8.16>> (s/p 14 d mycamine) Best Practices: Mouthcare, PPI po  Plan:  -  Continue Clinimix 5/15 (no lytes) @ 70 ml/hr to avoid exacerbating volume status. MD/RD aware TPN not meeting kcal goals with Clinimix alone. May need to consider increasing TPN rate if pt unable to take po's/change to enteral feeds soon. However pt appears to be maintaining weight and prealbumin on current regimen. - Provide available trace elements, MVI and lipids @10cc /hr on MWF only d/t national backorder  -F/u TPN labs in a.m.  Thanks, Christoper Fabian, PharmD, BCPS Clinical pharmacist, pager [phone] 05/19/2012 9:21 AM

## 2012-05-19 NOTE — Progress Notes (Signed)
ANTIBIOTIC CONSULT NOTE - FOLLOW UP  Pharmacy Consult for Fluconazole and Zosyn Indication: abdominal abscess with fistula  Allergies  Allergen Reactions  . Allopurinol     REACTION: decreased platelets  . Aspirin     REACTION: unspecified    Patient Measurements: Height: 5\' 11"  (180.3 cm) Weight: 117 lb 1 oz (53.1 kg) IBW/kg (Calculated) : 75.3   Vital Signs: Temp: 98.6 F (37 C) (08/28 0501) Temp src: Oral (08/28 0501) BP: 158/69 mmHg (08/28 1255) Pulse Rate: 67  (08/28 1255) Intake/Output from previous day: 08/27 0701 - 08/28 0700 In: 570 [P.O.:120; IV Piggyback:450] Out: 2000   Labs:  Basename 05/18/12 0838 05/17/12 0800  WBC -- 6.2  HGB -- 10.5*  PLT -- 105*  LABCREA -- --  CREATININE 4.57* 5.86*   Assessment: 51 y.o. M on Zosyn + Fluconazole per ID recommendations for abdominal abscess. The patient continues to have a drain in place. 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 ID recommends to continue treatment for at least another month and then to re-evaluate.  The patient has ESRD and was on HD T/Th/Sat PTA however has switched to 4x/week HD this admission while concurrently on TNA with a schedule of M/T/Th/Sat. Will adjust fluconazole dose to match new HD schedule.  8/1 Zosyn>> 8/3 Micafungin >> 8/16 8/17 Fluconazole >>  Goal of Therapy:  Appropriate doses for renal function and infection  Plan:  1. Adjust Fluconazole to 400 mg IV after HD sessions on M/T/Th/Sat 2. Continue Zoysn 2.25g IV every 8 hours 3. Will continue to follow HD schedule/duration, culture results, LOT, and ID recommendatoins  Georgina Pillion, PharmD, BCPS Clinical Pharmacist Pager: [phone] 05/19/2012 2:11 PM

## 2012-05-19 NOTE — Progress Notes (Signed)
[facility] PROGRESS NOTE  WHITTAKER LENIS QMV:[id number] DOB: 01-15-1961 DOA: 04/02/2012 PCP: Trevor Iha, MD Brief narrative: This is a 51 year old male with past medical history of end-stage renal disease which dialyzes Monday Wednesdays and Fridays, who had a renal transplant about 17 years ago, prostatectomy in 2012, comes in for abdominal pain nausea or vomiting. He relates one day prior to admission he started having abdominal pain which progressively got worse it was mainly periumbilical nothing made it better or worse had no radiation. But in the middle of the night he took some TUMS and the pain did not improve. After this he started vomiting about 3 or 4 times. So he decided to come here to the emergency room. Here in the emergency room his pain was relieved by narcotics. A CT scan of the abdomen and pelvis was done that shows small bowel obstruction we were asked to mid and further evaluate.  Since his hospitalization the patient had exploratory laparotomy and lysis of adhesions as a result of a small bowel obstruction. He subsequently had an anastomotic leak with an abscess which is has necessitated an omental patching of the anastomosis and percutaneous drain placement. The patient had an elevation of his white blood cell count and CT of the abdomen revealed an enlarging abscess an enteric fistula was obstructed drainage tube necessitated repositioning of the percutaneous drain a percutaneous drain. Presently the patient has an open abdominal wound which is connected with the abscess cavity and is being managed by general surgery and wound ostomy care. The patient also had   Assessment/Plan: Principal Problem:  SBO (small bowel obstruction) s/p exploratory laparotomy and lysis of adhesions and partial small bowel x 2 on 7/24 *Subsequent anastomotic leak- omental patch of anastomosis 8/2  *CT abdomen and pelvis 04/29/2012 demonstrated several fluid collections consistent with  intra-abdominal abscess with the largest measuring 16.5 x 13.7 x 6.4 cm.  *Patient underwent percutaneous drainage of largest fluid collection 04/30/2012  *Repeat CT done for increasing WBC count on 8/13 revealed enlarging abscess and enteric fistula and obstructed drainage tube. He underwent drain reposition on 8/14  *Has open abdom wound which is connecting with abscess cavity  *Care per surgical team - NG tube has been removed; pt continued on ice chips * Appreciate surgical team recommendations * Continue IV nutrition   Peritonitis secondary to anastomotic dehiscence/intra-abdominal abscess  *Cont zosyn and diflucan  For at least 1 more month  per ID recommendations  *Surgery following wound-see above regarding multiple intra-abdominal postoperative abscess and percutaneous drain insertion  Ileus following gastrointestinal surgery *Cont TPN via right femoral Vas-Cath port per surgery  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  Patient requested to lie flat and not bend right hip to protect access.  Chronic nonocclusive left lower extremity DVT  *Diagnosed via Doppler 04/30/2012 - he continues with left lower extermity edema - f/u doppler is negative   CKD (chronic kidney disease) stage V requiring chronic dialysis  *Dialysis per nephrology    HTN (hypertension), elevated today but has tendency to drop precipitously.  Will trend before starting anti-hypertensives  Anemia *Stable at 10.5. Due to acute blood  loss and due to chronic disease - see above regarding wound bleeding  *Has  received a total of 4 units of packed red blood cells this admission  *Cont aranesp and ferric gluconate   Chronic use of steroids *Hydrocortisone now down to 25 mg daily. I will continue until patient is able to resume his oral steroids.   Leg graft occlusion **s/p thrombectomy 7/25 - unfortunately re clotted - has undergone an additional thrombectomy procedure with revision of arterial and graft on 04/27/2012  *Was being dialyzed via HD cath in right groin but now graft is being used again  *See discussion per vascular surgery concerning narrowing at the venous anastomosis  Severe protein calorie malnutrition *Patient cachectic at presentation  *Parenteral nutrition managed by surgery and pharmacy   Disposition  * I spoke with Nephrology who reports that as long as the patient remains on TNA, he would likely require dialysis 4 x  week.  * [facility] however at present they're unable to accept the patient onto his dialysis regimen is down to 3 times per week.  Code Status: Full Family Communication: Not applicable Disposition Plan: Not yet determined  Lailyn Appelbaum, [facility]  [facility] Pager [phone]. If 8PM-8AM, please contact night-coverage at [email or web address], password [facility] 05/19/2012, 5:00 PM  LOS: 47 days     Consultants: General surgery Nephrology Infectious disease the  Procedures: 7/24 Ex-lap, lysis of adhesions, partial small bowel resection x2  7/25 Thrombectomy L femoral AV graft  7/26 Extubated, CVL is out  7/27 Transferred to telemetry under [facility]  8/2 Acute abdomen, dehiscence of anastomosis, SBO, back from OR intubated in ICU on PCCM service. Found to have moderate peritonitis infraumbilically  8/2 Extubated   Antibiotics: Zosyn 8/1 >>>  Micafungin 8/3 >>8/16  Invanz 7/24 periop  Fluconazole- 8/2 + 8/17 >>  Vancomycin 8/8 >>> 05/01/2012   HPI/Subjective: Patient continues to feel improved.  Denies fevers, chills, nausea,  vomiting, abdominal pain, chest pain, difficulty breathing.    Objective: Filed Vitals:   05/18/12 2119 05/19/12 0024 05/19/12 0501 05/19/12 1255  BP: 168/82 163/74 165/82 158/69  Pulse: 73 76 80 67  Temp: 98.1 F (36.7 C) 98.3 F (36.8 C) 98.6 F (37 C)   TempSrc: Oral Oral Oral   Resp: 18 18 18    Height: 5\' 11"  (1.803 m)     Weight: 53.1 kg (117 lb 1 oz)     SpO2: 100% 100% 100%    Weight change: 2.4 kg (5 lb 4.7 oz)  Intake/Output Summary (Last 24 hours) at 05/19/12 1700 Last data filed at 05/19/12 0300  Gross per 24 hour  Intake    570 ml  Output      0 ml  Net    570 ml    General:  Cachectic AAM. HEENT: [place]/AT PEERL, EOMI, MMM Heart: Regular rate and rhythm, without murmurs, rubs, gallops.  Lungs: Clear to auscultation. Abdomen: Soft, non-tender, rare bowel sounds, nondistended. Dry dressings superiorly on abd fistula are clean and dry (per patient healing well) and pouch with yellowish discharge below with bowels apparent. Percutaneous drain draining green-brown material on right side of abd/flank. Neuro: No focal neurological deficits noted . Musculoskeletal:  Decreased tone and bulk.  Data Reviewed: Basic Metabolic Panel:  Lab 05/19/12 4540 05/18/12 0838 05/17/12 0800 05/16/12 0500 05/15/12 0900 05/13/12 0845  NA -- 134* 126* 129* 127* --  K -- 3.5 3.3* 3.5 3.5 --  CL -- 96 89* 92* 91* --  CO2 -- 24 21 25 19  --  GLUCOSE -- 91 90 103* 88 --  BUN -- 47* 78* 47* 82* --  CREATININE -- 4.57* 5.86* 3.88* 5.40* --  CALCIUM -- 9.6 9.5 9.3 9.7 --  MG -- 1.6 -- -- -- 1.5  PHOS 1.9* 1.5* -- -- 2.3 2.7   Liver Function Tests:  Lab 05/18/12 0838 05/17/12 0800 05/15/12 0900  AST 37 32 --  ALT 28 26 --  ALKPHOS 135* 134* --  BILITOT 1.9* 2.1* --  PROT 5.3* 5.0* --  ALBUMIN 1.5* 1.4* 1.5*   No results found for this basename: LIPASE:5,AMYLASE:5 in the last 168 hours No results found for this basename: AMMONIA:5 in the last 168 hours CBC:  Lab 05/17/12 0800  05/15/12 0900 05/14/12 0355 05/13/12 0817  WBC 6.2 7.9 7.6 10.3  NEUTROABS 4.3 -- -- --  HGB 10.5* 9.9* 10.0* 9.6*  HCT 31.2* 29.4* 30.8* 29.0*  MCV 82.1 82.6 85.3 83.6  PLT 105* 103* 102* 99*   Cardiac Enzymes: No results found for this basename: CKTOTAL:5,CKMB:5,CKMBINDEX:5,TROPONINI:5 in the last 168 hours BNP (last 3 results) No results found for this basename: PROBNP:3 in the last 8760 hours CBG:  Lab 05/19/12 0815 05/19/12 0026 05/18/12 2123 05/18/12 1629 05/18/12 1502  GLUCAP 94 150* 142* 90 100*    No results found for this or any previous visit (from the past 240 hour(s)).   Studies: Ct Angio Chest Pe W/cm &/or Wo Cm  04/25/2012  *RADIOLOGY REPORT*  Clinical Data: Chest pain and cough.  CT ANGIOGRAPHY CHEST  Technique:  Multidetector CT imaging of the chest using the standard protocol during bolus administration of intravenous contrast. Multiplanar reconstructed images including MIPs were obtained and reviewed to evaluate the vascular anatomy.  Contrast: OMNIPAQUE IOHEXOL 350 MG/ML SOLN  Comparison: None.  Findings: Technically adequate study with good opacification of the central and segmental pulmonary arteries.  No focal filling defects.  No evidence of significant pulmonary embolus.  Normal heart size.  Normal caliber thoracic aorta without dissection. Aortic and coronary artery calcifications.  Enteric tube with tip below the level of the stomach, not visualized.  Small amount of mucous in the trachea.  Airways generally appear patent.  There is a small left pleural effusion with basilar atelectasis or consolidation on the left.  Vague patchy infiltration in the left upper lung may represent edema or pneumonia.  No pneumothorax. There is pneumoperitoneum noted in the upper abdomen.  This likely results from recent surgery with noted laparotomy on 04/22/2012. Normal alignment of the thoracic spine.  IMPRESSION: No evidence of significant pulmonary embolus or aortic dissection.  Small left pleural effusion with basilar atelectasis or consolidation.  Vague patchy infiltration in the left upper lung. Abdominal pneumoperitoneum consistent with recent surgery.  Original Report Authenticated By: Marlon Pel, M.D.   Ct Guided Abscess Drain  04/30/2012  *RADIOLOGY REPORT*  Indication: Post surgery for small bowel obstruction, now with anastomotic leak and large intra-abdominal abscess.  CT GUIDED RIGHT LOWER QUADRANT ABDOMINAL DRAINAGE CATHETER PLACEMENT  Comparison: CT abdomen pelvis - 04/29/2012  Medications: Fentanyl 75 mcg IV; Versed 1.5 mg IV  Total Moderate Sedation time: 20 minutes  Contrast: None  Complications: None immediate  Technique / Findings:  Informed written consent was obtained from the patient after a discussion of the risks, benefits and alternatives to treatment. The patient was placed supine on the CT gantry and a pre procedural CT was performed re-demonstrating the known abscess/fluid collection within the  right lower abdominal quadrant.  The procedure was planned.   A timeout was performed prior to the initiation of the procedure.  The skin overlying the right lower abdomen was prepped and draped in the usual sterile fashion.   The overlying soft tissues were anesthetized with 1% lidocaine with epinephrine.  An 18 gauge trocar needle was advanced in to the abscess/fluid collection and a Avyukth Bontempo Amplatz super stiff wire was coiled within the abscess/fluid collection.   Appropriate positioning was confirmed with a limited CT scan.  The tract was serially dilated allowing placement of a 10 [place] all-purpose drainage catheter.  Appropriate positioning was confirmed with a limited postprocedural CT scan.  200 ml of bloody, slightly purulent fluid was aspirated.  The tube was connected to a drainage bag and sutured in place.  A dressing was placed.  The patient tolerated the procedure well without immediate post procedural complication.  Impression:  Successful CT guided  placement of a 10 [place] all purpose drain catheter into the right lower abdomen with aspiration of 200 mL of bloody, slightly purulent fluid.  Samples were sent to the laboratory as requested by the ordering clinical team.  Original Report Authenticated By: Waynard Reeds, M.D.   Ct Abdomen Pelvis W Contrast  05/04/2012  *RADIOLOGY REPORT*  Clinical Data: Status post surgery to treat the small bowel obstruction.  A right lower quadrant abscess was treated with percutaneous drainage on 04/30/2012.  CT ABDOMEN AND PELVIS WITH CONTRAST  Technique:  Multidetector CT imaging of the abdomen and pelvis was performed following the standard protocol during bolus administration of intravenous contrast.  Contrast:  75 ml Omnipaque-300 IV  Comparison: 04/30/2012 and 04/29/2012  Findings: Visualized lung bases show a left basilar pleural effusion and consolidation/atelectasis of the posterior left lower lobe.  There is extravasation of oral contrast from the small bowel into a residual abscess cavity within the right lower quadrant of the abdomen.  The percutaneous drain is located in the posterior/dependent aspect of the cavity and shows some surrounding debris which may be acting to occlude/partially occlude the catheter.  The abscess cavity remains of significant size, measuring over 10 cm in greatest dimensions.  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  Consider repositioning/upsizing of the current percutaneous drain or placement of a second drain into the more anterior aspect of the residual collection.  No new abscess collections are identified. There is no evidence of gross free intraperitoneal air.  Solid organs of the abdomen are stable in appearance.  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 Indwelling percutaneous drain lies in the dependent portion of this collection which may contain debris obstructing the catheter. Given the large residual collection present, further drain manipulation including potential repositioning and upsizing of the current drain and also potentially placement of a second more anteriorly positioned drain may be helpful.  This will be communicated to the interventional Radiology service.  Original Report Authenticated By: Reola Calkins, M.D.   Ct Abdomen Pelvis W Contrast  04/29/2012  *RADIOLOGY REPORT*  Clinical Data: Post surgery for small bowel obstruction  in an anastomotic leak.  Leukocytosis.  Dialysis patient.  CT ABDOMEN AND PELVIS WITH CONTRAST  Technique:  Multidetector CT imaging of the abdomen and pelvis was performed following the standard protocol during bolus administration of intravenous contrast.  Contrast: OMNIPAQUE IOHEXOL 300 MG/ML  SOLN  Comparison: 04/03/2012  Findings: New small left pleural effusion with basilar atelectasis or consolidation on the left.  Enteric tube with tip in the distal stomach.  The liver and spleen appear homogeneous without enlargement.  Portal and mesenteric vessels appear patent.  The gallbladder is moderately distended and is filled with increased density material suggesting vicarious contrast excretion. Pancreatic duct is diffusely dilated as seen previously.  No obstructing mass or stone is visualized.  Consider MRCP for further evaluation.  Extensive calcification of the abdominal aorta, iliac vessels, and splenic artery.  Calcification of the mesenteric artery with flow demonstrated.  Left adrenal gland nodule measuring 14 mm diameter.  This appears stable since the previous study but density  measures are indeterminate.  No significant retroperitoneal lymphadenopathy.  Bilateral renal atrophy with multiple bilateral renal cysts.  No hydronephrosis.  The stomach, small bowel, and colon are decompressed.  No residual small bowel obstruction. Interval postoperative changes with midline surgical defect along the anterior abdominal wall and skin clips in the left groin region.  There is interval development of a large gas and fluid collections in the right pelvis extending from anteriorly to posteriorly and extending up into the right lower quadrant.  This collection measures up to about 16.5 x 13.7 x 6.4 cm.  The collection has a mildly thickened enhancing wall and air-fluid levels are present.  The appearance is consistent with loculated fluid or abscess.  There are additional loculated fluid collections in the anterior right lower quadrant measuring 3.3 x .9 centimeters and between the medial ascending colon and psoas muscle measuring 2.6 x 2.6 cm.  Sulci are consistent with abscesses.  Probably loculated fluid collection at the tip of the liver inferiorly. Infiltration into the mesenteric fat in the upper abdomen around the spleen and liver may represent edema.  No loculation is apparent.  Pelvis:  Pelvic transplant kidney with diffuse atrophy and prominent renal sinus fat.  No hydronephrosis.  Vascular calcification in the graft.  There  is a vascular graft extending from the common femoral vein inferiorly and from the common femoral artery laterally on the left.  Extensive calcification of the native femoral arteries bilaterally with suggestion of significant stenosis of the right common femoral artery.  Edema in the subcutaneous soft tissues particularly on the left.  Normal alignment of the lumbar vertebra.  IMPRESSION: Interval postoperative changes.  No residual bowel obstruction. Multiple loculated fluid collections including a very large right abdominal and pelvic collection consistent with  abscesses. Vicarious contrast excretion into the gallbladder.  New left pleural effusion and basilar atelectasis.  Additional incidental findings are stable since the previous study.  Results were telephoned to and Marchelle Folks, the patient's nurse on [facility]- [id number] at the time of dictation, 2129 hours on 04/29/2012.  Original Report Authenticated By: Marlon Pel, M.D.   Ir Catheter Tube Change  05/05/2012  *RADIOLOGY REPORT*  Clinical history:51 year old with multiple medical problems including a small bowel leak.  The patient has a right lower quadrant drain which is incompletely draining the large abscess collection.  PROCEDURE(S): EXCHANGE AND REPOSITIONING OF THE PERCUTANEOUS DRAINAGE CATHETER WITH FLUOROSCOPY  Physician: Rachelle Hora. Henn, MD  Medications:Versed 2 mg, Fentanyl 25 mcg. A radiology nurse monitored  the patient for moderate sedation.  Moderate sedation time:22 minutes  Fluoroscopy time: 3.8 minutes  Procedure:Informed consent was obtained for a drain repositioning and exchange.  The patient was placed supine on the interventional table.  The existing catheter in the right lower quadrant was prepped and draped in a sterile fashion.  Maximal barrier sterile technique was utilized including caps, mask, sterile gowns, sterile gloves, sterile drape, hand hygiene and skin antiseptic.  Catheter was injected with contrast and the catheter was cut and removed over a Bentson wire.  A Kumpe catheter was advanced into the upper portion of the collection.  Position was confirmed with ultrasound. A 12-French biliary drain was advanced over a Bentson wire and into the superior aspect the collection.  Greater than 200 ml of dark brown fluid was removed.  Catheter sutured to the skin and attached to a gravity bag.  Findings:The old catheter was located within the inferior posterior aspect of the abscess collection.  Adjacent to the collection, there is high density material which is probably related to the small bowel  fistula or leak.  Catheter and wire were advanced into the anterior and superior aspect of the collection.  Biliary drain was also advanced into the superior aspect of the collection. Greater than 200 ml of dark brown fluid was removed.  Complications: None  Impression:Successful exchange and repositioning of the drainage catheter.  The large abscess was successfully decompressed following placement of the new catheter.  Original Report Authenticated By: Richarda Overlie, M.D.   Ir Fluoro Guide Cv Line Left  04/23/2012  *RADIOLOGY REPORT*  Clinical Data/Indication: BOWEL OBSTRUCTION.  CENTRAL VENOUS ACCESS REQUESTED.  IR LEFT FLOURO GUIDE CV LINE  Fluoroscopy Time: 2.5 minutes.  Procedure: The procedure, risks, benefits, and alternatives were explained to the patient. Questions regarding the procedure were encouraged and answered. The patient understands and consents to the procedure.  The right neck was prepped with betadine in a sterile fashion, and a sterile drape was applied covering the operative field. A sterile gown and sterile gloves were used for the procedure.  Sonographic imaging over the right neck demonstrate a collateral venous structures only without a viable large central venous structure.  Under sonographic guidance, a micropuncture needle was inserted into one of the collateral vessels and removed over a 0018 wire.  The wire could not be advanced centrally.  Central venous access from the right neck was aborted.  Subsequently, under sonographic guidance, a micropuncture needle was inserted into the left internal jugular vein and removed over a 0.8 wire.  A 3-French dilator was inserted.  Central venography confirms central venous occlusion.  Findings: Central venous occlusion into the SVC is demonstrated. Center venous access was not placed.  Complications: None.  IMPRESSION: Central venous access was not placed.  Upper extremity and internal jugular vein center venous access is chronically occluded.   Original Report Authenticated By: Donavan Burnet, M.D.   Ir US Guide Vasc Access Left  04/23/2012  Please refer to accession number [id number].  Original Report Authenticated By: Donavan Burnet, M.D.   Dg Chest Port 1 View  05/05/2012  *RADIOLOGY REPORT*  Clinical Data: Chest pain.  PORTABLE CHEST - 1 VIEW  Comparison: 04/28/2012  Findings: Nasogastric tube extends at least as   the   stomach, tip not seen.  Left lower lung consolidation / atelectasis has partially improved since the previous exam.  Right lung remains clear.  Probable small left pleural effusion.  Regional bones unremarkable.  IMPRESSION:  1.  Slight improvement in the left lower lung consolidation / atelectasis, with persistent small effusion.  Original Report Authenticated By: Osa Craver, M.D.   Dg Chest Port 1 View  04/28/2012  *RADIOLOGY REPORT*  Clinical Data: Progressive leukocytosis.  PORTABLE CHEST - 1 VIEW  Comparison: Chest x-ray dated 04/23/2012 and chest CT dated 04/25/2012  Findings: There is a persistent moderate left effusion with compressive atelectasis in the left lower lobe.  Heart size and vascularity are normal.  Right lung is clear.  NG tube tip is below the diaphragm.  IMPRESSION: Progressive left pleural effusion and left lower lobe atelectasis.  Original Report Authenticated By: Gwynn Burly, M.D.   Dg Chest Port 1 View  04/23/2012  *RADIOLOGY REPORT*  Clinical Data: Endotracheal tube placed  PORTABLE CHEST - 1 VIEW  Comparison: 04/15/2012  Findings: Endotracheal tube tip 7.2 cm proximal to the carina. Interval removal of the left IJ catheter.  Hyperinflated lungs. Mild retrocardiac opacity and left hemidiaphragm elevation.  No pneumothorax.  A enteric tube descends into the abdomen, tip not visualized.  There is nonspecific metallic density projecting over the left upper quadrant of the abdomen.  No acute osseous finding.  IMPRESSION: Endotracheal and enteric tubes as above.  Retrocardiac opacity;  atelectasis, aspiration, or infiltrate.  Hyperinflation.  Original Report Authenticated By: Waneta Martins, M.D.   Dg Abd 2 Views  04/20/2012  *RADIOLOGY REPORT*  Clinical Data: Postop abdominal distention, recent of abdominal surgery for small bowel obstruction and lysis of the patient with partial small bowel resection  ABDOMEN - 2 VIEW  Comparison: Portable abdomen film of 04/16/2012  Findings: Both large and small bowel gas is present which may indicate postoperative ileus.  However there does appear be more distention of small bowel loops and a partial small bowel obstruction cannot be excluded.  Some contrast is scattered throughout the bowel.  Arterial calcifications are noted.  IMPRESSION: Distention of both large and small bowel may indicate ileus, but a partial small bowel obstruction cannot be excluded.  Original Report Authenticated By: Juline Patch, M.D.   Dg Abd Portable 1v  04/28/2012  *RADIOLOGY REPORT*  Clinical Data: Abdominal pain.  Ileus.  Progressive leukocytosis.  PORTABLE ABDOMEN - 1 VIEW  Comparison: Radiographs dated 04/20/2012  Findings: NG tube tip is in the body of the stomach.  There are no dilated loops of bowel.  Double-lumen catheter is in the right femoral vein with the tips in the inferior vena cava.  Contrast is seen in the gallbladder.  There is also scattered high density material in the bowel.  IMPRESSION: No acute abnormalities.  Original Report Authenticated By: Gwynn Burly, M.D.   Dg Abd Portable 1v  04/16/2012  *RADIOLOGY REPORT*  Clinical Data: No bowel activity since exploratory laparoscopy 2 days ago question ileus, right lower quadrant pain  PORTABLE ABDOMEN - 1 VIEW  Comparison: 04/13/2012  Findings: Nasogastric tube in stomach. Retained contrast in colon. Surgical clips in pelvis bilaterally. Scattered atherosclerotic calcifications. No bowel dilatation, bowel wall thickening, or evidence of obstruction. Bowel staple lines noted in right mid abdomen.   IMPRESSION: Nonobstructive bowel gas pattern. Resolution of small bowel distention since prior exam.  Original Report Authenticated By: Lollie Marrow, M.D.   Ir Shuntogram/ Fistulagram Left Mod Sed  05/10/2012  *RADIOLOGY REPORT*  Clinical Data: Left leg swelling.  Graft revision 2 weeks ago.  AV SHUNTOGRAM  Procedure:  The left thigh was prepped and draped in a sterile fashion.  An 18 gauge Angiocath was inserted into the AV graft. Contrast was injected.  The Angiocath was removed.  Hemostasis was achieved with direct pressure. No complication.  Findings: The arterial anastomosis and graft are widely patent. There is moderate narrowing at the venous anastomosis.  There is associated narrowing of the external iliac vein.  Central venous structures are otherwise patent  IMPRESSION: Venous anastomotic and external iliac vein narrowing.  Otherwise patent circuit.   Original Report Authenticated By: Donavan Burnet, M.D. ( 05/10/2012 13:05:15 )     Scheduled Meds:    . antiseptic oral rinse  15 mL Mouth Rinse q12n4p  . chlorhexidine  15 mL Mouth Rinse BID  . darbepoetin (ARANESP) injection - DIALYSIS  200 mcg Intravenous Q Sat-HD  . fentaNYL  37.5 mcg Transdermal Q72H  . fluconazole (DIFLUCAN) IV  400 mg Intravenous Custom  . heparin  40 Units/kg Dialysis Once in dialysis  . hydrocortisone sod succinate (SOLU-CORTEF) injection  25 mg Intravenous Daily  . insulin aspart  0-15 Units Subcutaneous Q8H  . lip balm   Topical BID  . metoprolol  5 mg Intravenous Q6H  . pantoprazole  40 mg Oral Q1200  . piperacillin-tazobactam (ZOSYN)  IV  2.25 g Intravenous Q8H  . sodium chloride  10-40 mL Intracatheter Q12H  . sodium glycerophosphate 0.9% NaCl IVPB  10 mmol Intravenous Once  . DISCONTD: fluconazole (DIFLUCAN) IV  400 mg Intravenous Q T,Th,Sa-HD   Continuous Infusions:    . fat emulsion 240 mL (05/17/12 1801)  . fat emulsion    . TPN (CLINIMIX) +/- additives 70 mL/hr at 05/17/12 1800  . TPN  (CLINIMIX) +/- additives 70 mL/hr at 05/18/12 1723  . TPN (CLINIMIX) +/- additives      Principal Problem:  *SBO (small bowel obstruction) s/p EL/LOA and SBR x 2 Active Problems:  CKD (chronic kidney disease) stage V requiring chronic dialysis  Thrombocytopenia  Gout  HTN (hypertension)  Ileus following gastrointestinal surgery  Chronic use of steroids  Malnutrition, calorie  Leg graft occlusion  Acute respiratory failure with hypoxia  Sinus tachycardia  Abscess of abdominal cavity  Wound dehiscence  Bleeding  Anemia

## 2012-05-19 NOTE — Progress Notes (Signed)
22 Days Post-Op  Subjective: Generally feels well offers no new c/o.  Objective: Vital signs in last 24 hours: Temp:  [97.4 F (36.3 C)-98.6 F (37 C)] 98.6 F (37 C) (08/28 0501) Pulse Rate:  [69-93] 80  (08/28 0501) Resp:  [15-18] 18  (08/28 0501) BP: (102-168)/(60-82) 165/82 mmHg (08/28 0501) SpO2:  [98 %-100 %] 100 % (08/28 0501) Weight:  [117 lb 1 oz (53.1 kg)-120 lb 9.5 oz (54.7 kg)] 117 lb 1 oz (53.1 kg) (08/27 2119) Last BM Date: 05/18/12  Intake/Output from previous day: 08/27 0701 - 08/28 0700 In: 570 [P.O.:120; IV Piggyback:450] Out: 2000  Intake/Output this shift:    General appearance: alert, cooperative, appears stated age and no distress Abdomen:soft,minimally tender, + BS, eakins pouch in place; output is  bilous/enteric looking.  Perc drain: bloody enteric looking drainage.  VSS, afebrile.    Lab Results:   Basename 05/17/12 0800  WBC 6.2  HGB 10.5*  HCT 31.2*  PLT 105*   BMET  Basename 05/18/12 0838 05/17/12 0800  NA 134* 126*  K 3.5 3.3*  CL 96 89*  CO2 24 21  GLUCOSE 91 90  BUN 47* 78*  CREATININE 4.57* 5.86*  CALCIUM 9.6 9.5   PT/INR No results found for this basename: LABPROT:2,INR:2 in the last 72 hours ABG No results found for this basename: PHART:2,PCO2:2,PO2:2,HCO3:2 in the last 72 hours  Studies/Results: No results found.  Anti-infectives: Anti-infectives     Start     Dose/Rate Route Frequency Ordered Stop   05/17/12 1600   fluconazole (DIFLUCAN) IVPB 200 mg        200 mg 100 mL/hr over 60 Minutes Intravenous  Once 05/17/12 1129 05/17/12 1940   05/08/12 1200   fluconazole (DIFLUCAN) IVPB 400 mg        400 mg 200 mL/hr over 60 Minutes Intravenous Every T-Th-Sa (Hemodialysis) 05/07/12 1752     04/30/12 1400   vancomycin (VANCOCIN) 750 mg in sodium chloride 0.9 % 150 mL IVPB  Status:  Discontinued        750 mg 150 mL/hr over 60 Minutes Intravenous  Once 04/30/12 1212 04/30/12 1507   04/29/12 2330   vancomycin  (VANCOCIN) 1,500 mg in sodium chloride 0.9 % 500 mL IVPB        1,500 mg 250 mL/hr over 120 Minutes Intravenous  Once 04/29/12 2259 04/30/12 0246   04/27/12 0000   ceFAZolin (ANCEF) IVPB 1 g/50 mL premix  Status:  Discontinued     Comments: Send with pt to OR      1 g 100 mL/hr over 30 Minutes Intravenous On call 04/26/12 0927 04/26/12 0928   04/24/12 1000   micafungin (MYCAMINE) 100 mg in sodium chloride 0.9 % 100 mL IVPB  Status:  Discontinued        100 mg 100 mL/hr over 1 Hours Intravenous Daily 04/24/12 0750 05/07/12 1713   04/23/12 1600   fluconazole (DIFLUCAN) IVPB 400 mg        400 mg 200 mL/hr over 60 Minutes Intravenous  Once 04/23/12 1428 04/23/12 1800   04/22/12 2200   piperacillin-tazobactam (ZOSYN) IVPB 3.375 g  Status:  Discontinued        3.375 g 100 mL/hr over 30 Minutes Intravenous 3 times per day 04/22/12 2120 04/22/12 2137   04/22/12 2200  piperacillin-tazobactam (ZOSYN) IVPB 2.25 g       2.25 g 100 mL/hr over 30 Minutes Intravenous 3 times per day 04/22/12 2140  04/22/12 2157   gentamicin (GARAMYCIN) injection  Status:  Discontinued          As needed 04/22/12 2158 04/22/12 2335   04/22/12 2156   clindamycin (CLEOCIN) injection  Status:  Discontinued          As needed 04/22/12 2157 04/22/12 2335   04/22/12 2145  piperacillin-tazobactam (ZOSYN) IVPB 3.375 g       3.375 g 12.5 mL/hr over 240 Minutes Intravenous  Once 04/22/12 2141 04/23/12 0145   04/22/12 2130   gentamicin (GARAMYCIN) 240 mg, clindamycin (CLEOCIN) 900 mg in sodium chloride irrigation 0.9 % 1,000 mL irrigation  Status:  Discontinued         Irrigation  Once 04/22/12 2119 04/23/12 0020   04/14/12 0830   ertapenem (INVANZ) 1 g in sodium chloride 0.9 % 50 mL IVPB        1 g 100 mL/hr over 30 Minutes Intravenous To Surgery 04/14/12 0816 04/14/12 0834   04/13/12 1359   ertapenem (INVANZ) 1 g in sodium chloride 0.9 % 50 mL IVPB  Status:  Discontinued        1 g 100 mL/hr over 30 Minutes  Intravenous 60 min pre-op 04/13/12 1359 04/14/12 0816          Assessment/Plan: s/p Procedure(s) (LRB): THROMBECTOMY ARTERIOVENOUS GORE-TEX GRAFT (Left) REVISION OF ARTERIOVENOUS GORETEX GRAFT (Left)  1. Continue Eakins pouch and IR drain.  2. Continue with sips of water and ice chips.  3. Continue ABX : Zosyn 4. ESRD - per nephrology, s/p revision, Perm cath in place  5. Continue  TNA 6. ? Placement for long term care, (Case managers note of 05/17/12 is seen and appreciated) will await outcome of that discussion.     LOS: 47 days    Manuel Lawhead 05/19/2012

## 2012-05-19 NOTE — Progress Notes (Signed)
Nutrition Follow-up/Consult  Intervention:   1. Continue TPN per pharmacy 2. Recommend trophic enteral feedings once return of bowel function - will defer to surgery 3. RD to continue to follow nutrition care plan   Assessment:   Per wound care note, abdominal wound decreasing in size and drainage. Pt continues to require HD 4 x week.  Patient continues to TPN with Clinimix 5/15 @ 70 ml/hr. Lipids (20% IVFE @ 10 ml/hr), multivitamins, and trace elements are provided 3 times weekly (MWF) due to national backorder. Provides 1398 kcal and 84 grams protein daily (based on weekly average). Meets 82% minimum estimated kcal and 100% estimated protein needs. Per TPN pharmacist - may need to consider increasing TPN rate if unable to take po's soon.  Diet Order:  NPO; sips and ice chips  Meds: Scheduled Meds:    . antiseptic oral rinse  15 mL Mouth Rinse q12n4p  . chlorhexidine  15 mL Mouth Rinse BID  . darbepoetin (ARANESP) injection - DIALYSIS  200 mcg Intravenous Q Sat-HD  . fentaNYL  37.5 mcg Transdermal Q72H  . fluconazole (DIFLUCAN) IV  400 mg Intravenous Q T,Th,Sa-HD  . heparin  40 Units/kg Dialysis Once in dialysis  . hydrocortisone sod succinate (SOLU-CORTEF) injection  25 mg Intravenous Daily  . insulin aspart  0-15 Units Subcutaneous Q8H  . lip balm   Topical BID  . metoprolol  5 mg Intravenous Q6H  . pantoprazole  40 mg Oral Q1200  . piperacillin-tazobactam (ZOSYN)  IV  2.25 g Intravenous Q8H  . sodium chloride  10-40 mL Intracatheter Q12H  . sodium glycerophosphate 0.9% NaCl IVPB  10 mmol Intravenous Once  . DISCONTD: insulin aspart  0-15 Units Subcutaneous Q4H  . DISCONTD: sodium phosphate  Dextrose 5% IVPB  10 mmol Intravenous Once   Continuous Infusions:    . fat emulsion 240 mL (05/17/12 1801)  . TPN (CLINIMIX) +/- additives 70 mL/hr at 05/17/12 1800  . TPN (CLINIMIX) +/- additives 70 mL/hr at 05/18/12 1723   PRN Meds:.sodium chloride, sodium chloride, sodium chloride,  sodium chloride, acetaminophen, acetaminophen, albuterol, feeding supplement (NEPRO CARB STEADY), feeding supplement (NEPRO CARB STEADY), heparin, heparin, lidocaine, lidocaine, lidocaine-prilocaine, lidocaine-prilocaine, morphine injection, nitroGLYCERIN, ondansetron (ZOFRAN) IV, ondansetron, pentafluoroprop-tetrafluoroeth, pentafluoroprop-tetrafluoroeth, promethazine, sodium chloride  Labs:  CMP     Component Value Date/Time   NA 134* 05/18/2012 0838   K 3.5 05/18/2012 0838   CL 96 05/18/2012 0838   CO2 24 05/18/2012 0838   GLUCOSE 91 05/18/2012 0838   BUN 47* 05/18/2012 0838   CREATININE 4.57* 05/18/2012 0838   CALCIUM 9.6 05/18/2012 0838   PROT 5.3* 05/18/2012 0838   ALBUMIN 1.5* 05/18/2012 0838   AST 37 05/18/2012 0838   ALT 28 05/18/2012 0838   ALKPHOS 135* 05/18/2012 0838   BILITOT 1.9* 05/18/2012 0838   GFRNONAA 13* 05/18/2012 0838   GFRAA 16* 05/18/2012 0838   Phosphorus  Date/Time Value Range Status  05/18/2012  8:38 AM 1.5* 2.3 - 4.6 mg/dL Final  4/78/2956  2:13 AM 2.3  2.3 - 4.6 mg/dL Final  0/86/5784  6:96 AM 2.7  2.3 - 4.6 mg/dL Final   Potassium  Date/Time Value Range Status  05/18/2012  8:38 AM 3.5  3.5 - 5.1 mEq/L Final  05/17/2012  8:00 AM 3.3* 3.5 - 5.1 mEq/L Final  05/16/2012  5:00 AM 3.5  3.5 - 5.1 mEq/L Final   Magnesium  Date/Time Value Range Status  05/18/2012  8:38 AM 1.6  1.5 - 2.5 mg/dL Final  05/13/2012  8:45 AM 1.5  1.5 - 2.5 mg/dL Final  1/61/0960  4:54 AM 1.6  1.5 - 2.5 mg/dL Final   Prealbumin  Date/Time Value Range Status  05/17/2012  8:00 AM 23.4  17.0 - 34.0 mg/dL Final  0/98/1191  4:78 AM 26.6  17.0 - 34.0 mg/dL Final  2/95/6213  0:86 AM 25.1  17.0 - 34.0 mg/dL Final    Intake/Output Summary (Last 24 hours) at 05/19/12 0826 Last data filed at 05/19/12 0300  Gross per 24 hour  Intake    570 ml  Output   2000 ml  Net  -1430 ml  BM 8/27  Weight Status: 53.1 kg s/p HD on 8/27 - stable 51.5 kg s/p HD on 8/22 52.1 kg s/p HD on 8/20  54 kg s/p HD on  8/17 63.8 kg s/p HD on 8/12 65.7 kg s/p HD on 8/8  50.5 kg s/p HD on 8/6 50 kg s/p HD on 8/5 47.8 kg s/p HD on 7/30  48.3 kg s/p HD on 7/27  Body mass index is 16.33 kg/(m^2). Underweight.  Estimated needs:  [calculated using the Kidney Disease Outcomes Quality Initiative (KDOQI) Adjustment Equation for the Underweight Patient]: 1700 - 1950 kcal, 80 - 100 grams protein daily  Nutrition Dx:  Inadequate oral intake now R/T GI distress AEB pt report. Ongoing.  Goal:  Intake to meet at least 90% of estimated needs. Not met.  Monitor:  Diet advancement, TPN adequacy, weights, labs, I/O's  Jarold Motto MS, RD, LDN Pager: [phone] After-hours pager: [phone]

## 2012-05-20 ENCOUNTER — Inpatient Hospital Stay (HOSPITAL_COMMUNITY): Payer: Medicare Other

## 2012-05-20 LAB — CBC
HCT: 32.4 % — ABNORMAL LOW (ref 39.0–52.0)
Hemoglobin: 10.7 g/dL — ABNORMAL LOW (ref 13.0–17.0)
MCH: 27.8 pg (ref 26.0–34.0)
MCV: 84.2 fL (ref 78.0–100.0)
RBC: 3.85 MIL/uL — ABNORMAL LOW (ref 4.22–5.81)

## 2012-05-20 LAB — COMPREHENSIVE METABOLIC PANEL
AST: 37 U/L (ref 0–37)
Albumin: 1.5 g/dL — ABNORMAL LOW (ref 3.5–5.2)
Alkaline Phosphatase: 156 U/L — ABNORMAL HIGH (ref 39–117)
BUN: 58 mg/dL — ABNORMAL HIGH (ref 6–23)
Chloride: 93 mEq/L — ABNORMAL LOW (ref 96–112)
Potassium: 3.1 mEq/L — ABNORMAL LOW (ref 3.5–5.1)
Total Bilirubin: 1.8 mg/dL — ABNORMAL HIGH (ref 0.3–1.2)

## 2012-05-20 LAB — MAGNESIUM: Magnesium: 1.5 mg/dL (ref 1.5–2.5)

## 2012-05-20 MED ORDER — SODIUM PHOSPHATE 3 MMOLE/ML IV SOLN: 10.0000 mmol | Freq: Once | INTRAVENOUS | Status: DC

## 2012-05-20 MED ORDER — INSULIN REGULAR HUMAN 100 UNIT/ML IJ SOLN
INTRAVENOUS | Status: AC
  Administered 2012-05-20: 17:00:00 via INTRAVENOUS
  Filled 2012-05-20: qty 2000

## 2012-05-20 MED ORDER — SODIUM GLYCEROPHOSPHATE 1 MMOLE/ML IV SOLN
10.0000 mmol | Freq: Once | INTRAVENOUS | Status: AC
  Administered 2012-05-20: 10 mmol via INTRAVENOUS
  Filled 2012-05-20: qty 10

## 2012-05-20 MED ORDER — PIPERACILLIN-TAZOBACTAM IN DEX 2-0.25 GM/50ML IV SOLN
2.2500 g | Freq: Three times a day (TID) | INTRAVENOUS | Status: DC
  Administered 2012-05-20 – 2012-06-02 (×34): 2.25 g via INTRAVENOUS
  Filled 2012-05-20 (×40): qty 50

## 2012-05-20 NOTE — Progress Notes (Signed)
PARENTERAL NUTRITION CONSULT NOTE - FOLLOW UP  Pharmacy Consult for TPN Indication: SBO with new EC fistula  Allergies  Allergen Reactions  . Allopurinol     REACTION: decreased platelets  . Aspirin     REACTION: unspecified    Patient Measurements: Height: 5\' 11"  (180.3 cm) Weight: 119 lb 11.4 oz (54.3 kg) (ed scale with air mattress) IBW/kg (Calculated) : 75.3    Vital Signs: Temp: 97.6 F (36.4 C) (08/29 0635) Temp src: Oral (08/29 0635) BP: 128/70 mmHg (08/29 0900) Pulse Rate: 78  (08/29 0900) Intake/Output from previous day: 08/28 0701 - 08/29 0700 In: 2070 [IV Piggyback:100; TPN:1920] Out: 5 [Drains:5] Intake/Output from this shift:    Labs:  Basename 05/20/12 0600  WBC 9.0  HGB 10.7*  HCT 32.4*  PLT 132*  APTT --  INR --     Basename 05/20/12 0600 05/19/12 1000 05/18/12 0838  NA 128* -- 134*  K 3.1* -- 3.5  CL 93* -- 96  CO2 22 -- 24  GLUCOSE 115* -- 91  BUN 58* -- 47*  CREATININE 5.28* -- 4.57*  LABCREA -- -- --  CREAT24HRUR -- -- --  CALCIUM 9.7 -- 9.6  MG 1.5 -- 1.6  PHOS 1.8* 1.9* 1.5*  PROT 5.2* -- 5.3*  ALBUMIN 1.5* -- 1.5*  AST 37 -- 37  ALT 32 -- 28  ALKPHOS 156* -- 135*  BILITOT 1.8* -- 1.9*  BILIDIR -- -- --  IBILI -- -- --  PREALBUMIN -- -- --  TRIG -- -- --  CHOLHDL -- -- --  CHOL -- -- --   Estimated Creatinine Clearance: 12.6 ml/min (by C-G formula based on Cr of 5.28).    Basename 05/20/12 0021 05/19/12 1649 05/19/12 0815  GLUCAP 156* 128* 94   Insulin Requirements in the past 24 hours:  3 units Novolog SSI and 15 units regular insulin in TNA  Nutritional Goals:  1700-1950 kCal, 80-100 grams of protein per day   Current Nutrition:  Clinimix 5/15 at 70 ml/hr with 20% lipids at 10 ml/hr MWF provides an average of 1398 kcal and 84g protein per day  Admit: Admitted 7/13 with abd pain, weight loss, SBO. S/p LOA, SBR 7/24 and repeat LOA, SBR with anasomotic leak repair 8/1, thrombectomy with AVG revision 7/25 and  repeat thrombectomy 8/6. CT on 8/13 reveals enlarging abscess and enteric fistula.  GI: NGT out. Drains continue with significant drainage and bloody drainage. Tolerating TPN. Pt allowed to have ice chips and sips of water but doesn't appear to be any current plans to advance po diet. Pt's wt appears to be relatively stable. Endo: CBGs well controlled. Solu-Cortef daily.  Lytes: K 3.1, Na 128, Phos 1.8. Phos remains low despite replacement 8/28. MD to replace again today. Hypercalcemia continues (corrected Ca 11.7, Ca x PO4 remains < 55). Renal MD replacing lytes. Renal: ESRD with HD typically TTS. HD is currently being administered prn d/t volume issues. Currently 4x/week HD while pt requiring TPN. On Aranesp qSat.  Cards: BP mildly elevated (but with hypotension during HD). HR controlled. On scheduled lopressor and prn hydralazine.  Hepatobil: LFTs WNL 8/27 except alk phos/Tbili elevated. Prealbumin remains at goal. Trig WNL.  Neuro: GCS 15, no c/o pain - on fentanyl patch, prn pain meds.  ID: Fluconazole + Zosyn for peritonitis/abd abscess -- ID rec-cont current regimen for another mos with repeat CT scan to verify abscess smaller. With fistulas present, per ID may need indefinite abx with drains in place. Afeb,  WBC wnl. Abd abscess culture: candida tropicalis.  Zosyn 8.1>>  Diflucan 8.16>> (s/p 14 d mycamine) Best Practices: Mouthcare, PPI po  Plan:  - Continue Clinimix 5/15 (no lytes) @ 70 ml/hr to avoid exacerbating volume status. MD/RD aware TPN not meeting kcal goals with Clinimix alone. May need to consider increasing TPN rate if pt unable to take po's/change to enteral feeds soon. However pt appears to be maintaining weight and prealbumin on current regimen. - Will consider addition of electrolytes to TPN for 1-2 days if pt continues with low K, Phos, Mg - Provide available trace elements, MVI and lipids @10cc /hr on MWF only d/t national backorder  - F/u BMET, Phos, Mg in  a.m.  Thanks, Christoper Fabian, PharmD, BCPS Clinical pharmacist, pager [phone] 05/20/2012 9:19 AM

## 2012-05-20 NOTE — Evaluation (Signed)
Occupational Therapy Evaluation Patient Details Name: [name] MRN: [id number] DOB: August 11, 1961 Today's Date: 05/20/2012 Time: 4010-2725 OT Time Calculation (min): 18 min  OT Assessment / Plan / Recommendation Clinical Impression  This 51 yo male admitted with abdominal pain nausea and vomiting, s/p exploratory lap, wound dehiscence, on HD, Eakins drain, femoral line (RLE)--to keep hip straight, and prolonged hospital stay presents to acute OT with problems below. Will benefti from acute OT with followup at [facility]    OT Assessment  Patient needs continued OT Services    Follow Up Recommendations  LTACH    Barriers to Discharge None    Equipment Recommendations  Defer to next venue    Recommendations for Other Services    Frequency  Min 1X/week    Precautions / Restrictions Precautions Precautions: Fall;Other (comment) (right groin triple lumen catheter , NO HIP FLEXION RIGHT HIP) Precaution Comments: Keep RLE straight at hip Restrictions Weight Bearing Restrictions: No       ADL  Eating/Feeding: Simulated;Independent Where Assessed - Eating/Feeding: Bed level Grooming: Simulated;Set up Where Assessed - Grooming: Supine, head of bed up Upper Body Bathing: Simulated;Set up Where Assessed - Upper Body Bathing: Supine, head of bed up Lower Body Bathing: Simulated;Moderate assistance Where Assessed - Lower Body Bathing: Supine, head of bed up;Supine, head of bed flat;Rolling right and/or left Upper Body Dressing: Simulated;Set up Where Assessed - Upper Body Dressing: Supine, head of bed up Lower Body Dressing: Simulated;Moderate assistance Where Assessed - Lower Body Dressing: Supine, head of bed up;Supine, head of bed flat;Rolling right and/or left Toilet Transfer: Simulated;+1 Total assistance (to place bedpan) Toilet Transfer Method:  (rolling with Mod I)    OT Diagnosis: Generalized weakness  OT Problem List: Decreased strength OT Treatment Interventions:  Therapeutic exercise;Patient/family education;Balance training   OT Goals Acute Rehab OT Goals OT Goal Formulation: With patient Time For Goal Achievement: 06/03/12 Potential to Achieve Goals: Good Arm Goals Pt Will Complete Theraband Exer: Bilateral upper extremities;Independently;1 set;10 reps;Level 1 Theraband (to pt's tolerance so as to not pull on wounds) Arm Goal: Theraband Exercises - Progress: Goal set today Miscellaneous OT Goals Miscellaneous OT Goal #1: Pt will be able to tolerate upright standing with tilt table for 3 minutes OT Goal: Miscellaneous Goal #1 - Progress: Goal set today Miscellaneous OT Goal #2: Pt will be able to ambulate to the bathroom and back in prep for transfers with Min A OT Goal: Miscellaneous Goal #2 - Progress: Goal set today  Visit Information  Last OT Received On: 05/20/12 Assistance Needed: +1 (in bed) PT/OT Co-Evaluation/Treatment: Yes (thinking we were going to be able to get him up)    Subjective Data  Subjective: I am not suppose to bend my right leg very much Patient Stated Goal: What can I do besides move my arms and left leg to keep strong   Prior Functioning  Vision/Perception  Home Living Lives With: Alone Available Help at Discharge: Family (stay with sister who works days) Type of Home: House Home Access: Stairs to enter Secretary/administrator of Steps: 2-3 Entrance Stairs-Rails: None Home Layout: One level Bathroom Shower/Tub: Engineer, manufacturing systems: Standard Home Adaptive Equipment: Environmental consultant - rolling;Straight cane Prior Function Level of Independence: Independent Able to Take Stairs?: Yes Driving: Yes Vocation: On disability Communication Communication: No difficulties Dominant Hand: Right      Cognition  Overall Cognitive Status: Appears within functional limits for tasks assessed/performed Arousal/Alertness: Awake/alert Orientation Level: Appears intact for tasks assessed Behavior During Session: [facility]  for tasks performed    Extremity/Trunk Assessment Right Upper Extremity Assessment RUE ROM/Strength/Tone: Within functional levels (for bed mobility) Left Upper Extremity Assessment LUE ROM/Strength/Tone: Within functional levels (for bed mobility) Right Lower Extremity Assessment RLE ROM/Strength/Tone: Unable to fully assess;Due to precautions (right femoarl catheter)   Mobility  Shoulder Instructions  Bed Mobility Bed Mobility: Rolling Right;Rolling Left Rolling Right: 6: Modified independent (Device/Increase time);With rail Rolling Left: 6: Modified independent (Device/Increase time);With rail Details for Bed Mobility Assistance: Unable to sit up at this time due to RLE bending precautions       Exercise     Balance     End of Session OT - End of Session Activity Tolerance: Patient tolerated treatment well Patient left: in bed Nurse Communication: Mobility status (Pt not OOB at this time due to RLE to keep straight)       Evette Georges [phone] 05/20/2012, 2:12 PM

## 2012-05-20 NOTE — Progress Notes (Signed)
Patient ID: [name], male   DOB: 1961-01-28, 51 y.o.   MRN: [id number] 23 Days Post-Op  Subjective: Generally feels well offers no new c/o. Undergoing dialysis.  Objective: Vital signs in last 24 hours: Temp:  [97.6 F (36.4 C)-98.3 F (36.8 C)] 97.6 F (36.4 C) (08/29 0635) Pulse Rate:  [66-74] 69  (08/29 0730) Resp:  [18] 18  (08/29 0730) BP: (148-179)/(63-83) 148/80 mmHg (08/29 0730) SpO2:  [96 %-100 %] 97 % (08/29 0635) Weight:  [117 lb 15.1 oz (53.5 kg)-119 lb 11.4 oz (54.3 kg)] 119 lb 11.4 oz (54.3 kg) (08/29 0635) Last BM Date: 05/18/12  Intake/Output from previous day: 08/28 0701 - 08/29 0700 In: 2070 [IV Piggyback:100; TPN:1920] Out: 5 [Drains:5] Intake/Output this shift:    General appearance: alert, cooperative, appears stated age and no distress Abdomen:soft,minimally tender, + BS, eakins pouch in place; output is  bilous/enteric looking.  Perc drain: bloody enteric looking drainage.  VSS, afebrile., WBC wnl.   Lab Results:   Basename 05/17/12 0800  WBC 6.2  HGB 10.5*  HCT 31.2*  PLT 105*   BMET  Basename 05/18/12 0838 05/17/12 0800  NA 134* 126*  K 3.5 3.3*  CL 96 89*  CO2 24 21  GLUCOSE 91 90  BUN 47* 78*  CREATININE 4.57* 5.86*  CALCIUM 9.6 9.5   PT/INR No results found for this basename: LABPROT:2,INR:2 in the last 72 hours ABG No results found for this basename: PHART:2,PCO2:2,PO2:2,HCO3:2 in the last 72 hours  Studies/Results: No results found.  Anti-infectives: Anti-infectives     Start     Dose/Rate Route Frequency Ordered Stop   05/20/12 2000   fluconazole (DIFLUCAN) IVPB 400 mg        400 mg 200 mL/hr over 60 Minutes Intravenous Once per day on Mon Tue Thu Sat 05/19/12 1417     05/17/12 1600   fluconazole (DIFLUCAN) IVPB 200 mg        200 mg 100 mL/hr over 60 Minutes Intravenous  Once 05/17/12 1129 05/17/12 1940   05/08/12 1200   fluconazole (DIFLUCAN) IVPB 400 mg  Status:  Discontinued        400 mg 200 mL/hr over 60  Minutes Intravenous Every T-Th-Sa (Hemodialysis) 05/07/12 1752 05/19/12 1417   04/30/12 1400   vancomycin (VANCOCIN) 750 mg in sodium chloride 0.9 % 150 mL IVPB  Status:  Discontinued        750 mg 150 mL/hr over 60 Minutes Intravenous  Once 04/30/12 1212 04/30/12 1507   04/29/12 2330   vancomycin (VANCOCIN) 1,500 mg in sodium chloride 0.9 % 500 mL IVPB        1,500 mg 250 mL/hr over 120 Minutes Intravenous  Once 04/29/12 2259 04/30/12 0246   04/27/12 0000   ceFAZolin (ANCEF) IVPB 1 g/50 mL premix  Status:  Discontinued     Comments: Send with pt to OR      1 g 100 mL/hr over 30 Minutes Intravenous On call 04/26/12 0927 04/26/12 0928   04/24/12 1000   micafungin (MYCAMINE) 100 mg in sodium chloride 0.9 % 100 mL IVPB  Status:  Discontinued        100 mg 100 mL/hr over 1 Hours Intravenous Daily 04/24/12 0750 05/07/12 1713   04/23/12 1600   fluconazole (DIFLUCAN) IVPB 400 mg        400 mg 200 mL/hr over 60 Minutes Intravenous  Once 04/23/12 1428 04/23/12 1800   04/22/12 2200   piperacillin-tazobactam (ZOSYN) IVPB 3.375 g  Status:  Discontinued        3.375 g 100 mL/hr over 30 Minutes Intravenous 3 times per day 04/22/12 2120 04/22/12 2137   04/22/12 2200   piperacillin-tazobactam (ZOSYN) IVPB 2.25 g        2.25 g 100 mL/hr over 30 Minutes Intravenous 3 times per day 04/22/12 2140     04/22/12 2157   gentamicin (GARAMYCIN) injection  Status:  Discontinued          As needed 04/22/12 2158 04/22/12 2335   04/22/12 2156   clindamycin (CLEOCIN) injection  Status:  Discontinued          As needed 04/22/12 2157 04/22/12 2335   04/22/12 2145   piperacillin-tazobactam (ZOSYN) IVPB 3.375 g        3.375 g 12.5 mL/hr over 240 Minutes Intravenous  Once 04/22/12 2141 04/23/12 0145   04/22/12 2130   gentamicin (GARAMYCIN) 240 mg, clindamycin (CLEOCIN) 900 mg in sodium chloride irrigation 0.9 % 1,000 mL irrigation  Status:  Discontinued         Irrigation  Once 04/22/12 2119 04/23/12 0020    04/14/12 0830   ertapenem (INVANZ) 1 g in sodium chloride 0.9 % 50 mL IVPB        1 g 100 mL/hr over 30 Minutes Intravenous To Surgery 04/14/12 0816 04/14/12 0834   04/13/12 1359   ertapenem (INVANZ) 1 g in sodium chloride 0.9 % 50 mL IVPB  Status:  Discontinued        1 g 100 mL/hr over 30 Minutes Intravenous 60 min pre-op 04/13/12 1359 04/14/12 0816          Assessment/Plan: s/p Procedure(s) (LRB): THROMBECTOMY ARTERIOVENOUS GORE-TEX GRAFT (Left) REVISION OF ARTERIOVENOUS GORETEX GRAFT (Left)  1. Continue Eakins pouch and IR drain.  2. Continue with sips of water and ice chips.  3. ? Continue ABX : Zosyn Will discuss with Dr. Corliss Skains. 4. ESRD - per nephrology, s/p revision, Perm cath in place (patient is on 4 day HD schedule secondary to TPN) 5. Continue  TNA 6. Placement for long term care, (Case managers note of 05/17/12 is seen and appreciated) will await outcome of that discussion.     LOS: 48 days    Maimuna Leaman 05/20/2012

## 2012-05-20 NOTE — Plan of Care (Signed)
Problem: Phase II Progression Outcomes Goal: Progress activity as tolerated unless otherwise ordered Outcome: Not Progressing Has right femoral catheter and NO HIP FLEXION PERMITTED

## 2012-05-20 NOTE — Progress Notes (Signed)
23 Days Post-Op  Subjective: RLQ abscess drain placed 8/9; up sized 8/14 Output slowing Feels some better  Objective: Vital signs in last 24 hours: Temp:  [97.6 F (36.4 C)-98.3 F (36.8 C)] 97.6 F (36.4 C) (08/29 0635) Pulse Rate:  [66-86] 86  (08/29 0930) Resp:  [18] 18  (08/29 0930) BP: (114-179)/(63-83) 114/73 mmHg (08/29 0930) SpO2:  [96 %-100 %] 97 % (08/29 0635) Weight:  [117 lb 15.1 oz (53.5 kg)-119 lb 11.4 oz (54.3 kg)] 119 lb 11.4 oz (54.3 kg) (08/29 0635) Last BM Date: 05/18/12  Intake/Output from previous day: 08/28 0701 - 08/29 0700 In: 2070 [IV Piggyback:100; TPN:1920] Out: 5 [Drains:5] Intake/Output this shift:    PE: afeb; vss In dialysis Output drain 5 cc yesterday 20 cc in bag now; bloody; gas/air in bag today Site clean and dry Wbc wnl  Lab Results:   Basename 05/20/12 0600  WBC 9.0  HGB 10.7*  HCT 32.4*  PLT 132*   BMET  Basename 05/20/12 0600 05/18/12 0838  NA 128* 134*  K 3.1* 3.5  CL 93* 96  CO2 22 24  GLUCOSE 115* 91  BUN 58* 47*  CREATININE 5.28* 4.57*  CALCIUM 9.7 9.6   PT/INR No results found for this basename: LABPROT:2,INR:2 in the last 72 hours ABG No results found for this basename: PHART:2,PCO2:2,PO2:2,HCO3:2 in the last 72 hours  Studies/Results: No results found.  Anti-infectives:   Assessment/Plan: s/p Procedure(s) (LRB): THROMBECTOMY ARTERIOVENOUS GORE-TEX GRAFT (Left) REVISION OF ARTERIOVENOUS GORETEX GRAFT (Left)  RLQ drain intact Will need Re CT when output minimal - soon Long term care pending per chart Plan per [facility]  Omolara Carol A 05/20/2012

## 2012-05-20 NOTE — Progress Notes (Signed)
[name] Progress Note  Subjective:  Having more better days than bad. Ok on HD.  Only problem is when he lays in one position for too long.  Taking ice chips and po PPI.  Not out of bed yet.  Objective Filed Vitals:   05/20/12 0635 05/20/12 0643 05/20/12 0700 05/20/12 0730  BP: 171/80 164/83 152/83 148/80  Pulse: 66 70 69 69  Temp: 97.6 F (36.4 C)     TempSrc: Oral     Resp: 18 18 18 18   Height:      Weight: 54.3 kg (119 lb 11.4 oz)     SpO2: 97%      Physical Exam goal 4.6  General: comfortable on  HD. Heart: RRR  Lungs: no wheezes or rales Abdomen: soft with ostomy bag over wound Extremities: tr LE edema Dialysis Access: left thigh graft Qb 400.    Assessment/Plan: 1. SBO sp surg/dehisc/abscess= off zoysn, still on diflucan, [facility] managing, requiring TNA 2. ESRD - no hep HD per surg request; can change to TIW HD K 3.1 on 4 K bath 3. Anemia -10.7 hgb on aranesp 200 no iron 4. HPTH- phos low, 1.8 has had replacement,- redose No binders,  no Zemplar 2.25 Ca bath today 5. HTN/volume - max uf 3-4 kg each HD        6.  Malnutrition = On TNA Alb 1.5, but prealbumin 23.4        7.  Gout - stable No pain on Solu-Cortef   Sheffield Slider, PA-C [facility] Beeper [phone]  05/20/2012,8:05 AM  LOS: 48 days   Patient seen and examined and agree with assessment and plan as above. I spoke with CM yesterday regarding LTAC discharge. We are going to move to HD 3x/week for now on a TTS schedule, which can be changed to MWF if needed.  Vinson Moselle  MD [facility] [phone] pgr    [phone] cell 05/20/2012, 9:55 AM     Additional Objective Labs: Basic Metabolic Panel:  Lab 05/20/12 2130 05/19/12 1000 05/18/12 0838 05/17/12 0800  NA 128* -- 134* 126*  K 3.1* -- 3.5 3.3*  CL 93* -- 96 89*  CO2 22 -- 24 21  GLUCOSE 115* -- 91 90  BUN 58* -- 47* 78*  CREATININE 5.28* -- 4.57* 5.86*  CALCIUM 9.7 -- 9.6 9.5  ALB -- -- -- --    PHOS 1.8* 1.9* 1.5* --   Liver Function Tests:  Lab 05/20/12 0600 05/18/12 0838 05/17/12 0800  AST 37 37 32  ALT 32 28 26  ALKPHOS 156* 135* 134*  BILITOT 1.8* 1.9* 2.1*  PROT 5.2* 5.3* 5.0*  ALBUMIN 1.5* 1.5* 1.4*  CBC:  Lab 05/20/12 0600 05/17/12 0800 05/15/12 0900 05/14/12 0355 05/13/12 0817  WBC 9.0 6.2 7.9 -- --  NEUTROABS -- 4.3 -- -- --  HGB 10.7* 10.5* 9.9* -- --  HCT 32.4* 31.2* 29.4* -- --  MCV 84.2 82.1 82.6 85.3 83.6  PLT 132* 105* 103* -- --  CBG:  Lab 05/20/12 0021 05/19/12 1649 05/19/12 0815 05/19/12 0026 05/18/12 2123  GLUCAP 156* 128* 94 150* 142*  Medications:    . fat emulsion 240 mL (05/19/12 1757)  . TPN (CLINIMIX) +/- additives 70 mL/hr at 05/18/12 1723  . TPN (CLINIMIX) +/- additives 70 mL/hr at 05/19/12 1757      . antiseptic oral rinse  15 mL Mouth Rinse q12n4p  . chlorhexidine  15 mL Mouth Rinse BID  .  darbepoetin (ARANESP) injection - DIALYSIS  200 mcg Intravenous Q Sat-HD  . fentaNYL  37.5 mcg Transdermal Q72H  . fluconazole (DIFLUCAN) IV  400 mg Intravenous Custom  . heparin  40 Units/kg Dialysis Once in dialysis  . hydrocortisone sod succinate (SOLU-CORTEF) injection  25 mg Intravenous Daily  . insulin aspart  0-15 Units Subcutaneous Q8H  . lip balm   Topical BID  . metoprolol  5 mg Intravenous Q6H  . pantoprazole  40 mg Oral Q1200  . sodium chloride  10-40 mL Intracatheter Q12H  . DISCONTD: fluconazole (DIFLUCAN) IV  400 mg Intravenous Q T,Th,Sa-HD  . DISCONTD: piperacillin-tazobactam (ZOSYN)  IV  2.25 g Intravenous Q8H

## 2012-05-20 NOTE — Procedures (Signed)
I was present at this dialysis session. I have reviewed the session itself and made appropriate changes.   Vinson Moselle, MD [employer] 05/20/2012, 11:01 AM

## 2012-05-20 NOTE — Progress Notes (Signed)
No significant changes. Will stop Zosyn - four weeks  Long-term planning  Wilmon Arms. Corliss Skains, MD, [facility]  05/20/2012 7:49 AM

## 2012-05-20 NOTE — Evaluation (Addendum)
Physical Therapy Evaluation Patient Details Name: [name] MRN: [id number] DOB: 30-Jan-1961 Today's Date: 05/20/2012 Time: 4098-1191 PT Time Calculation (min): 18 min  PT Assessment / Plan / Recommendation Clinical Impression  Pt. was admitted on April 02, 2012 with abdominal pain and underwent exploratory lap of abdomen for SBO.  Pt. subsequently has had dehisence of wound, and underwent hydrotherapy.  He is not currently not receiving hydro. Has had abdominal abscess and has eakins pouch.   Aditionally, pt. is on HD, has left groin AV graft and had right groin triple lumen catheter and is prohibited from right hip flexion.  He is on TPN  due to malnutrition.  Pt. will benefit from acute PT to address his mobility issues within the parameters of  NO RIGHT HIP FLEXION, therefore recommending a trial of bed to tilt table transfers and progressing his mobility from tilt table to upright and walking as long as he is not allowed riight hip flexion. This was discussed with Dr. Caesar Bookman and she gives  OK to proceed this way due to hip flexion precautions.    PT Assessment  Patient needs continued PT services    Follow Up Recommendations  LTACH    Barriers to Discharge        Equipment Recommendations  Defer to next venue    Recommendations for Other Services     Frequency Min 2X/week    Precautions / Restrictions Precautions Precautions: Fall;Other (comment) (right groin triple lumen catheter , NO HIP FLEXION RIGHT HIP) Precaution Comments: Keep RLE straight at hip Restrictions Weight Bearing Restrictions: No   Pertinent Vitals/Pain No c/o pain, no distress      Mobility  Bed Mobility Bed Mobility: Rolling Right;Rolling Left Rolling Right: 6: Modified independent (Device/Increase time);With rail Rolling Left: 6: Modified independent (Device/Increase time);With rail Right Sidelying to Sit: Not tested (comment) Sitting - Scoot to Edge of Bed: Not tested (comment) Sit to  Sidelying Right: Not Tested (comment) Details for Bed Mobility Assistance: Unable to sit up at this time due to RLE bending precautions Transfers Transfers: Not assessed Details for Transfer Assistance: no transfers Ambulation/Gait Ambulation/Gait Assistance: Not tested (comment)    Exercises     PT Diagnosis: Difficulty walking;Abnormality of gait;Generalized weakness  PT Problem List: Decreased strength;Decreased activity tolerance;Decreased mobility;Other (comment);Decreased knowledge of precautions (activity) PT Treatment Interventions: DME instruction;Gait training;Therapeutic activities;Functional mobility training   PT Goals Acute Rehab PT Goals PT Goal Formulation: With patient Time For Goal Achievement: 06/03/12 Potential to Achieve Goals: Fair Pt will Roll Supine to Right Side: Independently PT Goal: Rolling Supine to Right Side - Progress: Goal set today Pt will Roll Supine to Left Side: Independently PT Goal: Rolling Supine to Left Side - Progress: Goal set today PT Goal: Supine/Side to Sit - Progress: Other (comment) (goal not currently appropriate) Pt will go Sit to Supine/Side: Other (comment) (goal not appropriate) Pt will go Sit to Stand: Other (comment) (gaol not appropriate) Pt will go Stand to Sit: Other (comment) (goal not appropriate) Pt will Stand: Other (comment) (on tilt table 5 minutes without hypotension/diizziness) PT Goal: Stand - Progress: Goal set today Pt will Ambulate: 1 - 15 feet;with mod assist;with rolling walker PT Goal: Ambulate - Progress: Goal set today Additional Goals Additional Goal #1: pt. will state and observe no hip flexion right LE precaution at all times  Visit Information  Last PT Received On: 05/20/12 Assistance Needed: +1 (in bed)    Subjective Data  Subjective: I'm being really dautious  so my wound won't pop open Patient Stated Goal: "what else can I do, besides move my arms and legs?"   Prior Functioning  Home  Living Lives With: Alone Available Help at Discharge: Family (stay with sister who works days) Type of Home: House Home Access: Stairs to enter Secretary/administrator of Steps: 2-3 Entrance Stairs-Rails: None Home Layout: One level Bathroom Shower/Tub: Engineer, manufacturing systems: Standard Home Adaptive Equipment: Environmental consultant - rolling;Straight cane Prior Function Level of Independence: Independent Able to Take Stairs?: Yes Driving: Yes Vocation: On disability Communication Communication: No difficulties Dominant Hand: Right    Cognition  Overall Cognitive Status: Appears within functional limits for tasks assessed/performed Arousal/Alertness: Awake/alert Orientation Level: Appears intact for tasks assessed Behavior During Session: [facility] for tasks performed    Extremity/Trunk Assessment Right Upper Extremity Assessment RUE ROM/Strength/Tone: Within functional levels (for bed mobility) RUE ROM/Strength/Tone Deficits: decreased muscle mass, generally weak  Left Upper Extremity Assessment LUE ROM/Strength/Tone: Within functional levels (for bed mobility) LUE ROM/Strength/Tone Deficits: decreased muscle mass, generally weak  Right Lower Extremity Assessment RLE ROM/Strength/Tone: Unable to fully assess;Due to precautions (right femoarl catheter) RLE Sensation: WFL - Light Touch;WFL - Proprioception Left Lower Extremity Assessment LLE ROM/Strength/Tone: Deficits LLE ROM/Strength/Tone Deficits: generally weak, decrease muscle mass  LLE Sensation: WFL - Light Touch;WFL - Proprioception   Balance    End of Session PT - End of Session Activity Tolerance: Treatment limited secondary to medical complications (Comment);Other (comment) (limitd by right groin catheter) Patient left: in bed;with call bell/phone within reach Nurse Communication: Mobility status  GP     Ferman Hamming 05/20/2012, 2:28 PM .Weldon Picking PT Acute Rehab Services [phone] Beeper [phone]

## 2012-05-20 NOTE — Consult Note (Signed)
Wound care follow-up:  Small amt tan drainage in Eakin pouch, pt states it has not been emptied today.  Abd wound beefy red and upper section of wound has closed.   Plan:  Attempt to convert from Eakin pouch to fluffed gauze to contain decreased drainage to abd wound.  If drainage increases when re-assessed tomorrow, then Eakin pouch can be reapplied at that time.

## 2012-05-20 NOTE — Progress Notes (Signed)
[facility] PROGRESS NOTE  DEO MEHRINGER UVO:[id number] DOB: 1961/07/13 DOA: 04/02/2012 PCP: Trevor Iha, MD   Assessment/Plan: Principal Problem:  SBO (small bowel obstruction) s/p exploratory laparotomy and lysis of adhesions and partial small bowel x 2 on 7/24 *Subsequent anastomotic leak- omental patch of anastomosis 8/2  *CT abdomen and pelvis 04/29/2012 demonstrated several fluid collections consistent with intra-abdominal abscess with the largest measuring 16.5 x 13.7 x 6.4 cm.  *Patient underwent percutaneous drainage of largest fluid collection 04/30/2012  *Repeat CT done for increasing WBC count on 8/13 revealed enlarging abscess and enteric fistula and obstructed drainage tube. He underwent drain reposition on 8/14  *Has open abdom wound which is connecting with abscess cavity  *Care per surgical team - NG tube has been removed; pt continued on ice chips * Appreciate surgical team recommendations * Continue IV nutrition   Peritonitis secondary to anastomotic dehiscence/intra-abdominal abscess  *Cont zosyn and diflucan until 9/16 and then repeat CT scan prior to discontinuing per ID. 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  Patient should not bend the right hip per interventional radiology.    Chronic nonocclusive left lower extremity DVT    *Diagnosed via Doppler 04/30/2012 - he continues with left lower extermity edema - f/u doppler is negative  CKD (chronic kidney disease) stage V requiring chronic dialysis  *Dialysis per nephrology   HTN (hypertension), elevated today but has tendency to drop precipitously (as it did today).  Will trend before starting anti-hypertensives.    Anemia *Stable at 10.5. Due to acute blood loss and due to chronic disease - see above regarding wound bleeding  *Has received a total of 4 units of packed red blood cells this admission  *Cont aranesp and ferric gluconate   Chronic use of steroids *Hydrocortisone now down to 25 mg daily. I will continue until patient is able to resume his oral steroids.   Leg graft occlusion **s/p thrombectomy 7/25 - unfortunately re clotted - has undergone an additional thrombectomy procedure with revision of arterial and graft on 04/27/2012  *Was being dialyzed via HD cath in right groin but now graft is being used again  *See discussion per vascular surgery concerning narrowing at the venous anastomosis  Severe protein calorie malnutrition *Patient cachectic at presentation  *Parenteral nutrition managed by surgery and pharmacy   Disposition  * I spoke with Nephrology who reports that as long as the patient remains on TNA, he would likely require dialysis 4 x  week.  * Pursuing LTAC for this patient at [facility] and per case management, patient may be able to go to LTAC if HD bed available even with 4X/wk HD.  Code Status: Full Family Communication: Not applicable Disposition Plan:  Pending LTAC bed.  Renae Fickle  [facility] Pager [phone]. If 8PM-8AM, please contact night-coverage at [email or web address], password [facility] 05/20/2012, 4:16 PM  LOS: 48 days   Brief narrative: This is a 51 year old male with past medical history of end-stage renal disease which dialyzes Monday  Wednesdays and Fridays, who had a renal transplant about 17 years ago,  prostatectomy in 2012, comes in for abdominal pain nausea or vomiting. He relates one day prior to admission he started having abdominal pain which progressively got worse it was mainly periumbilical nothing made it better or worse had no radiation. But in the middle of the night he took some TUMS and the pain did not improve. After this he started vomiting about 3 or 4 times. So he decided to come here to the emergency room. Here in the emergency room his pain was relieved by narcotics. A CT scan of the abdomen and pelvis was done that shows small bowel obstruction we were asked to mid and further evaluate.  Since his hospitalization the patient had exploratory laparotomy and lysis of adhesions as a result of a small bowel obstruction. He subsequently had an anastomotic leak with an abscess which is has necessitated an omental patching of the anastomosis and percutaneous drain placement. The patient had an elevation of his white blood cell count and CT of the abdomen revealed an enlarging abscess an enteric fistula was obstructed drainage tube necessitated repositioning of the percutaneous drain a percutaneous drain. Presently the patient has an open abdominal wound which is connected with the abscess cavity and is being managed by general surgery and wound ostomy care.   Consultants: General surgery Nephrology Infectious disease the  Procedures: 7/24 Ex-lap, lysis of adhesions, partial small bowel resection x2  7/25 Thrombectomy L femoral AV graft  7/26 Extubated, CVL is out  7/27 Transferred to telemetry under [facility]  8/2 Acute abdomen, dehiscence of anastomosis, SBO, back from OR intubated in ICU on PCCM service. Found to have moderate peritonitis infraumbilically  8/2 Extubated   Antibiotics: Zosyn 8/1 >>> (tentative stop date of 9/16 with CT scan at that time) Micafungin 8/3 >>8/16  Invanz 7/24 periop  Fluconazole- 8/2 + 8/17 >>  (tentative stop date of 9/16 with CT scan at that  time) Vancomycin 8/8 >>> 05/01/2012   HPI/Subjective: Patient continues to feel improved.  Denies fevers, chills, nausea, vomiting, abdominal pain, chest pain, difficulty breathing.    Objective: Filed Vitals:   05/20/12 1030 05/20/12 1051 05/20/12 1210 05/20/12 1444  BP: 108/63 122/64 130/75 138/77  Pulse: 94 91 78 75  Temp:  97.1 F (36.2 C)  98.3 F (36.8 C)  TempSrc:  Oral  Oral  Resp: 18 18  16   Height:      Weight:  50.7 kg (111 lb 12.4 oz)    SpO2:    97%   Weight change: -1.2 kg (-2 lb 10.3 oz)  Intake/Output Summary (Last 24 hours) at 05/20/12 1616 Last data filed at 05/20/12 1500  Gross per 24 hour  Intake   2025 ml  Output   3975 ml  Net  -1950 ml    General:  Cachectic AAM. HEENT: PERRL, MM Heart: Regular rate and rhythm, without murmurs, rubs, gallops.  Lungs: Clear to auscultation. Abdomen: Soft, non-tender, rare bowel sounds, nondistended.  Patient has dry dressing only on abdomen that is clean and dry. Percutaneous drain draining green-brown material on right groin. Neuro: No focal neurological deficits noted . Musculoskeletal:  Decreased tone and bulk.  Data Reviewed: Basic Metabolic Panel:  Lab 05/20/12 8119 05/19/12 1000 05/18/12 0838 05/17/12 0800 05/16/12 0500 05/15/12 0900  NA 128* -- 134* 126* 129* 127*  K 3.1* -- 3.5 3.3* 3.5 3.5  CL 93* -- 96 89* 92* 91*  CO2 22 -- 24  21 25 19   GLUCOSE 115* -- 91 90 103* 88  BUN 58* -- 47* 78* 47* 82*  CREATININE 5.28* -- 4.57* 5.86* 3.88* 5.40*  CALCIUM 9.7 -- 9.6 9.5 9.3 9.7  MG 1.5 -- 1.6 -- -- --  PHOS 1.8* 1.9* 1.5* -- -- 2.3   Liver Function Tests:  Lab 05/20/12 0600 05/18/12 0838 05/17/12 0800 05/15/12 0900  AST 37 37 32 --  ALT 32 28 26 --  ALKPHOS 156* 135* 134* --  BILITOT 1.8* 1.9* 2.1* --  PROT 5.2* 5.3* 5.0* --  ALBUMIN 1.5* 1.5* 1.4* 1.5*   No results found for this basename: LIPASE:5,AMYLASE:5 in the last 168 hours No results found for this basename: AMMONIA:5 in the last 168  hours CBC:  Lab 05/20/12 0600 05/17/12 0800 05/15/12 0900 05/14/12 0355  WBC 9.0 6.2 7.9 7.6  NEUTROABS -- 4.3 -- --  HGB 10.7* 10.5* 9.9* 10.0*  HCT 32.4* 31.2* 29.4* 30.8*  MCV 84.2 82.1 82.6 85.3  PLT 132* 105* 103* 102*   Cardiac Enzymes: No results found for this basename: CKTOTAL:5,CKMB:5,CKMBINDEX:5,TROPONINI:5 in the last 168 hours BNP (last 3 results) No results found for this basename: PROBNP:3 in the last 8760 hours CBG:  Lab 05/20/12 0021 05/19/12 1649 05/19/12 0815 05/19/12 0026 05/18/12 2123  GLUCAP 156* 128* 94 150* 142*    No results found for this or any previous visit (from the past 240 hour(s)).   Studies: Ct Angio Chest Pe W/cm &/or Wo Cm  04/25/2012  *RADIOLOGY REPORT*  Clinical Data: Chest pain and cough.  CT ANGIOGRAPHY CHEST  Technique:  Multidetector CT imaging of the chest using the standard protocol during bolus administration of intravenous contrast. Multiplanar reconstructed images including MIPs were obtained and reviewed to evaluate the vascular anatomy.  Contrast: OMNIPAQUE IOHEXOL 350 MG/ML SOLN  Comparison: None.  Findings: Technically adequate study with good opacification of the central and segmental pulmonary arteries.  No focal filling defects.  No evidence of significant pulmonary embolus.  Normal heart size.  Normal caliber thoracic aorta without dissection. Aortic and coronary artery calcifications.  Enteric tube with tip below the level of the stomach, not visualized.  Small amount of mucous in the trachea.  Airways generally appear patent.  There is a small left pleural effusion with basilar atelectasis or consolidation on the left.  Vague patchy infiltration in the left upper lung may represent edema or pneumonia.  No pneumothorax. There is pneumoperitoneum noted in the upper abdomen.  This likely results from recent surgery with noted laparotomy on 04/22/2012. Normal alignment of the thoracic spine.  IMPRESSION: No evidence of significant  pulmonary embolus or aortic dissection. Small left pleural effusion with basilar atelectasis or consolidation.  Vague patchy infiltration in the left upper lung. Abdominal pneumoperitoneum consistent with recent surgery.  Original Report Authenticated By: Marlon Pel, M.D.   Ct Guided Abscess Drain  04/30/2012  *RADIOLOGY REPORT*  Indication: Post surgery for small bowel obstruction, now with anastomotic leak and large intra-abdominal abscess.  CT GUIDED RIGHT LOWER QUADRANT ABDOMINAL DRAINAGE CATHETER PLACEMENT  Comparison: CT abdomen pelvis - 04/29/2012  Medications: Fentanyl 75 mcg IV; Versed 1.5 mg IV  Total Moderate Sedation time: 20 minutes  Contrast: None  Complications: None immediate  Technique / Findings:  Informed written consent was obtained from the patient after a discussion of the risks, benefits and alternatives to treatment. The patient was placed supine on the CT gantry and a pre procedural CT was performed re-demonstrating the known  abscess/fluid collection within the right lower abdominal quadrant.  The procedure was planned.   A timeout was performed prior to the initiation of the procedure.  The skin overlying the right lower abdomen was prepped and draped in the usual sterile fashion.   The overlying soft tissues were anesthetized with 1% lidocaine with epinephrine.  An 18 gauge trocar needle was advanced in to the abscess/fluid collection and a Raeley Gilmore Amplatz super stiff wire was coiled within the abscess/fluid collection.   Appropriate positioning was confirmed with a limited CT scan.  The tract was serially dilated allowing placement of a 10 [place] all-purpose drainage catheter.  Appropriate positioning was confirmed with a limited postprocedural CT scan.  200 ml of bloody, slightly purulent fluid was aspirated.  The tube was connected to a drainage bag and sutured in place.  A dressing was placed.  The patient tolerated the procedure well without immediate post procedural  complication.  Impression:  Successful CT guided placement of a 10 [place] all purpose drain catheter into the right lower abdomen with aspiration of 200 mL of bloody, slightly purulent fluid.  Samples were sent to the laboratory as requested by the ordering clinical team.  Original Report Authenticated By: Waynard Reeds, M.D.   Ct Abdomen Pelvis W Contrast  05/04/2012  *RADIOLOGY REPORT*  Clinical Data: Status post surgery to treat the small bowel obstruction.  A right lower quadrant abscess was treated with percutaneous drainage on 04/30/2012.  CT ABDOMEN AND PELVIS WITH CONTRAST  Technique:  Multidetector CT imaging of the abdomen and pelvis was performed following the standard protocol during bolus administration of intravenous contrast.  Contrast:  75 ml Omnipaque-300 IV  Comparison: 04/30/2012 and 04/29/2012  Findings: Visualized lung bases show a left basilar pleural effusion and consolidation/atelectasis of the posterior left lower lobe.  There is extravasation of oral contrast from the small bowel into a residual abscess cavity within the right lower quadrant of the abdomen.  The percutaneous drain is located in the posterior/dependent aspect of the cavity and shows some surrounding debris which may be acting to occlude/partially occlude the catheter.  The abscess cavity remains of significant size, measuring over 10 cm in greatest dimensions.  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  Consider repositioning/upsizing of the current percutaneous drain or placement of a second drain into the more anterior aspect of the residual collection.  No new abscess collections are identified. There is no evidence of gross free intraperitoneal air.  Solid organs of the abdomen are stable in appearance.  There is a stable appearance to an atrophic left lower quadrant renal  transplant.  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 Indwelling percutaneous drain lies in the dependent portion of this collection which may contain debris obstructing the catheter. Given the large residual collection present, further drain manipulation including potential repositioning and upsizing of the current drain and also potentially placement of a second more anteriorly positioned drain may be helpful.  This will be communicated to the interventional Radiology service.  Original Report Authenticated By: Reola Calkins, M.D.   Ct Abdomen Pelvis W Contrast  04/29/2012  *RADIOLOGY REPORT*  Clinical Data: Post surgery  for small bowel obstruction in an anastomotic leak.  Leukocytosis.  Dialysis patient.  CT ABDOMEN AND PELVIS WITH CONTRAST  Technique:  Multidetector CT imaging of the abdomen and pelvis was performed following the standard protocol during bolus administration of intravenous contrast.  Contrast: OMNIPAQUE IOHEXOL 300 MG/ML  SOLN  Comparison: 04/03/2012  Findings: New small left pleural effusion with basilar atelectasis or consolidation on the left.  Enteric tube with tip in the distal stomach.  The liver and spleen appear homogeneous without enlargement.  Portal and mesenteric vessels appear patent.  The gallbladder is moderately distended and is filled with increased density material suggesting vicarious contrast excretion. Pancreatic duct is diffusely dilated as seen previously.  No obstructing mass or stone is visualized.  Consider MRCP for further evaluation.  Extensive calcification of the abdominal aorta, iliac vessels, and splenic artery.  Calcification of the mesenteric artery with flow demonstrated.  Left adrenal gland nodule measuring 14 mm diameter.  This  appears stable since the previous study but density measures are indeterminate.  No significant retroperitoneal lymphadenopathy.  Bilateral renal atrophy with multiple bilateral renal cysts.  No hydronephrosis.  The stomach, small bowel, and colon are decompressed.  No residual small bowel obstruction. Interval postoperative changes with midline surgical defect along the anterior abdominal wall and skin clips in the left groin region.  There is interval development of a large gas and fluid collections in the right pelvis extending from anteriorly to posteriorly and extending up into the right lower quadrant.  This collection measures up to about 16.5 x 13.7 x 6.4 cm.  The collection has a mildly thickened enhancing wall and air-fluid levels are present.  The appearance is consistent with loculated fluid or abscess.  There are additional loculated fluid collections in the anterior right lower quadrant measuring 3.3 x .9 centimeters and between the medial ascending colon and psoas muscle measuring 2.6 x 2.6 cm.  Sulci are consistent with abscesses.  Probably loculated fluid collection at the tip of the liver inferiorly. Infiltration into the mesenteric fat in the upper abdomen around the spleen and liver may represent edema.  No loculation is apparent.  Pelvis:  Pelvic transplant kidney with diffuse atrophy and prominent renal sinus fat.  No hydronephrosis.  Vascular calcification in the graft.  There  is a vascular graft extending from the common femoral vein inferiorly and from the common femoral artery laterally on the left.  Extensive calcification of the native femoral arteries bilaterally with suggestion of significant stenosis of the right common femoral artery.  Edema in the subcutaneous soft tissues particularly on the left.  Normal alignment of the lumbar vertebra.  IMPRESSION: Interval postoperative changes.  No residual bowel obstruction. Multiple loculated fluid collections including a very large right  abdominal and pelvic collection consistent with abscesses. Vicarious contrast excretion into the gallbladder.  New left pleural effusion and basilar atelectasis.  Additional incidental findings are stable since the previous study.  Results were telephoned to and Marchelle Folks, the patient's nurse on [facility]- [id number] at the time of dictation, 2129 hours on 04/29/2012.  Original Report Authenticated By: Marlon Pel, M.D.   Ir Catheter Tube Change  05/05/2012  *RADIOLOGY REPORT*  Clinical history:51 year old with multiple medical problems including a small bowel leak.  The patient has a right lower quadrant drain which is incompletely draining the large abscess collection.  PROCEDURE(S): EXCHANGE AND REPOSITIONING OF THE PERCUTANEOUS DRAINAGE CATHETER WITH FLUOROSCOPY  Physician: Rachelle Hora. Henn, MD  Medications:Versed 2 mg, Fentanyl 25 mcg.  A radiology nurse monitored the patient for moderate sedation.  Moderate sedation time:22 minutes  Fluoroscopy time: 3.8 minutes  Procedure:Informed consent was obtained for a drain repositioning and exchange.  The patient was placed supine on the interventional table.  The existing catheter in the right lower quadrant was prepped and draped in a sterile fashion.  Maximal barrier sterile technique was utilized including caps, mask, sterile gowns, sterile gloves, sterile drape, hand hygiene and skin antiseptic.  Catheter was injected with contrast and the catheter was cut and removed over a Bentson wire.  A Kumpe catheter was advanced into the upper portion of the collection.  Position was confirmed with ultrasound. A 12-French biliary drain was advanced over a Bentson wire and into the superior aspect the collection.  Greater than 200 ml of dark brown fluid was removed.  Catheter sutured to the skin and attached to a gravity bag.  Findings:The old catheter was located within the inferior posterior aspect of the abscess collection.  Adjacent to the collection, there is high density material  which is probably related to the small bowel fistula or leak.  Catheter and wire were advanced into the anterior and superior aspect of the collection.  Biliary drain was also advanced into the superior aspect of the collection. Greater than 200 ml of dark brown fluid was removed.  Complications: None  Impression:Successful exchange and repositioning of the drainage catheter.  The large abscess was successfully decompressed following placement of the new catheter.  Original Report Authenticated By: Richarda Overlie, M.D.   Ir Fluoro Guide Cv Line Left  04/23/2012  *RADIOLOGY REPORT*  Clinical Data/Indication: BOWEL OBSTRUCTION.  CENTRAL VENOUS ACCESS REQUESTED.  IR LEFT FLOURO GUIDE CV LINE  Fluoroscopy Time: 2.5 minutes.  Procedure: The procedure, risks, benefits, and alternatives were explained to the patient. Questions regarding the procedure were encouraged and answered. The patient understands and consents to the procedure.  The right neck was prepped with betadine in a sterile fashion, and a sterile drape was applied covering the operative field. A sterile gown and sterile gloves were used for the procedure.  Sonographic imaging over the right neck demonstrate a collateral venous structures only without a viable large central venous structure.  Under sonographic guidance, a micropuncture needle was inserted into one of the collateral vessels and removed over a 0018 wire.  The wire could not be advanced centrally.  Central venous access from the right neck was aborted.  Subsequently, under sonographic guidance, a micropuncture needle was inserted into the left internal jugular vein and removed over a 0.8 wire.  A 3-French dilator was inserted.  Central venography confirms central venous occlusion.  Findings: Central venous occlusion into the SVC is demonstrated. Center venous access was not placed.  Complications: None.  IMPRESSION: Central venous access was not placed.  Upper extremity and internal jugular vein  center venous access is chronically occluded.  Original Report Authenticated By: Donavan Burnet, M.D.   Ir US Guide Vasc Access Left  04/23/2012  Please refer to accession number [id number].  Original Report Authenticated By: Donavan Burnet, M.D.   Dg Chest Port 1 View  05/05/2012  *RADIOLOGY REPORT*  Clinical Data: Chest pain.  PORTABLE CHEST - 1 VIEW  Comparison: 04/28/2012  Findings: Nasogastric tube extends at least as   the   stomach, tip not seen.  Left lower lung consolidation / atelectasis has partially improved since the previous exam.  Right lung remains clear.  Probable small left pleural effusion.  Regional bones  unremarkable.  IMPRESSION:  1.  Slight improvement in the left lower lung consolidation / atelectasis, with persistent small effusion.  Original Report Authenticated By: Osa Craver, M.D.   Dg Chest Port 1 View  04/28/2012  *RADIOLOGY REPORT*  Clinical Data: Progressive leukocytosis.  PORTABLE CHEST - 1 VIEW  Comparison: Chest x-ray dated 04/23/2012 and chest CT dated 04/25/2012  Findings: There is a persistent moderate left effusion with compressive atelectasis in the left lower lobe.  Heart size and vascularity are normal.  Right lung is clear.  NG tube tip is below the diaphragm.  IMPRESSION: Progressive left pleural effusion and left lower lobe atelectasis.  Original Report Authenticated By: Gwynn Burly, M.D.   Dg Chest Port 1 View  04/23/2012  *RADIOLOGY REPORT*  Clinical Data: Endotracheal tube placed  PORTABLE CHEST - 1 VIEW  Comparison: 04/15/2012  Findings: Endotracheal tube tip 7.2 cm proximal to the carina. Interval removal of the left IJ catheter.  Hyperinflated lungs. Mild retrocardiac opacity and left hemidiaphragm elevation.  No pneumothorax.  A enteric tube descends into the abdomen, tip not visualized.  There is nonspecific metallic density projecting over the left upper quadrant of the abdomen.  No acute osseous finding.  IMPRESSION: Endotracheal and  enteric tubes as above.  Retrocardiac opacity; atelectasis, aspiration, or infiltrate.  Hyperinflation.  Original Report Authenticated By: Waneta Martins, M.D.   Dg Abd 2 Views  04/20/2012  *RADIOLOGY REPORT*  Clinical Data: Postop abdominal distention, recent of abdominal surgery for small bowel obstruction and lysis of the patient with partial small bowel resection  ABDOMEN - 2 VIEW  Comparison: Portable abdomen film of 04/16/2012  Findings: Both large and small bowel gas is present which may indicate postoperative ileus.  However there does appear be more distention of small bowel loops and a partial small bowel obstruction cannot be excluded.  Some contrast is scattered throughout the bowel.  Arterial calcifications are noted.  IMPRESSION: Distention of both large and small bowel may indicate ileus, but a partial small bowel obstruction cannot be excluded.  Original Report Authenticated By: Juline Patch, M.D.   Dg Abd Portable 1v  04/28/2012  *RADIOLOGY REPORT*  Clinical Data: Abdominal pain.  Ileus.  Progressive leukocytosis.  PORTABLE ABDOMEN - 1 VIEW  Comparison: Radiographs dated 04/20/2012  Findings: NG tube tip is in the body of the stomach.  There are no dilated loops of bowel.  Double-lumen catheter is in the right femoral vein with the tips in the inferior vena cava.  Contrast is seen in the gallbladder.  There is also scattered high density material in the bowel.  IMPRESSION: No acute abnormalities.  Original Report Authenticated By: Gwynn Burly, M.D.   Dg Abd Portable 1v  04/16/2012  *RADIOLOGY REPORT*  Clinical Data: No bowel activity since exploratory laparoscopy 2 days ago question ileus, right lower quadrant pain  PORTABLE ABDOMEN - 1 VIEW  Comparison: 04/13/2012  Findings: Nasogastric tube in stomach. Retained contrast in colon. Surgical clips in pelvis bilaterally. Scattered atherosclerotic calcifications. No bowel dilatation, bowel wall thickening, or evidence of obstruction.  Bowel staple lines noted in right mid abdomen.  IMPRESSION: Nonobstructive bowel gas pattern. Resolution of small bowel distention since prior exam.  Original Report Authenticated By: Lollie Marrow, M.D.   Ir Shuntogram/ Fistulagram Left Mod Sed  05/10/2012  *RADIOLOGY REPORT*  Clinical Data: Left leg swelling.  Graft revision 2 weeks ago.  AV SHUNTOGRAM  Procedure:  The left thigh was prepped and draped  in a sterile fashion.  An 18 gauge Angiocath was inserted into the AV graft. Contrast was injected.  The Angiocath was removed.  Hemostasis was achieved with direct pressure. No complication.  Findings: The arterial anastomosis and graft are widely patent. There is moderate narrowing at the venous anastomosis.  There is associated narrowing of the external iliac vein.  Central venous structures are otherwise patent  IMPRESSION: Venous anastomotic and external iliac vein narrowing.  Otherwise patent circuit.   Original Report Authenticated By: Donavan Burnet, M.D. ( 05/10/2012 13:05:15 )     Scheduled Meds:    . antiseptic oral rinse  15 mL Mouth Rinse q12n4p  . chlorhexidine  15 mL Mouth Rinse BID  . darbepoetin (ARANESP) injection - DIALYSIS  200 mcg Intravenous Q Sat-HD  . fentaNYL  37.5 mcg Transdermal Q72H  . fluconazole (DIFLUCAN) IV  400 mg Intravenous Custom  . heparin  40 Units/kg Dialysis Once in dialysis  . hydrocortisone sod succinate (SOLU-CORTEF) injection  25 mg Intravenous Daily  . insulin aspart  0-15 Units Subcutaneous Q8H  . lip balm   Topical BID  . metoprolol  5 mg Intravenous Q6H  . pantoprazole  40 mg Oral Q1200  . sodium chloride  10-40 mL Intracatheter Q12H  . sodium glycerophosphate 0.9% NaCl IVPB  10 mmol Intravenous Once  . DISCONTD: piperacillin-tazobactam (ZOSYN)  IV  2.25 g Intravenous Q8H  . DISCONTD: sodium phosphate  Dextrose 5% IVPB  10 mmol Intravenous Once   Continuous Infusions:    . fat emulsion 240 mL (05/19/12 1757)  . TPN (CLINIMIX) +/- additives  70 mL/hr at 05/18/12 1723  . TPN (CLINIMIX) +/- additives 70 mL/hr at 05/19/12 1757  . TPN (CLINIMIX) +/- additives      Principal Problem:  *SBO (small bowel obstruction) s/p EL/LOA and SBR x 2 Active Problems:  CKD (chronic kidney disease) stage V requiring chronic dialysis  Thrombocytopenia  Gout  HTN (hypertension)  Ileus following gastrointestinal surgery  Chronic use of steroids  Malnutrition, calorie  Leg graft occlusion  Acute respiratory failure with hypoxia  Sinus tachycardia  Abscess of abdominal cavity  Wound dehiscence  Bleeding  Anemia

## 2012-05-21 LAB — GLUCOSE, CAPILLARY
Glucose-Capillary: 110 mg/dL — ABNORMAL HIGH (ref 70–99)
Glucose-Capillary: 141 mg/dL — ABNORMAL HIGH (ref 70–99)

## 2012-05-21 LAB — BASIC METABOLIC PANEL
BUN: 40 mg/dL — ABNORMAL HIGH (ref 6–23)
CO2: 24 mEq/L (ref 19–32)
Calcium: 9.4 mg/dL (ref 8.4–10.5)
Creatinine, Ser: 4.03 mg/dL — ABNORMAL HIGH (ref 0.50–1.35)
Glucose, Bld: 109 mg/dL — ABNORMAL HIGH (ref 70–99)

## 2012-05-21 MED ORDER — FAT EMULSION 20 % IV EMUL
240.0000 mL | INTRAVENOUS | Status: AC
  Administered 2012-05-21: 240 mL via INTRAVENOUS
  Filled 2012-05-21: qty 250

## 2012-05-21 MED ORDER — HEPARIN SODIUM (PORCINE) 1000 UNIT/ML DIALYSIS
20.0000 [IU]/kg | INTRAMUSCULAR | Status: DC | PRN
  Administered 2012-05-22: 1000 [IU] via INTRAVENOUS_CENTRAL
  Filled 2012-05-21: qty 1

## 2012-05-21 MED ORDER — ZINC TRACE METAL 1 MG/ML IV SOLN
INTRAVENOUS | Status: AC
  Administered 2012-05-21: 18:00:00 via INTRAVENOUS
  Filled 2012-05-21: qty 2000

## 2012-05-21 MED ORDER — FLUCONAZOLE IN SODIUM CHLORIDE 400-0.9 MG/200ML-% IV SOLN
400.0000 mg | INTRAVENOUS | Status: DC
  Administered 2012-05-22 – 2012-06-01 (×4): 400 mg via INTRAVENOUS
  Filled 2012-05-21 (×7): qty 200

## 2012-05-21 NOTE — Progress Notes (Signed)
Pt's sister came to me and stated that she does not want her brother to go to [facility]. She became very upset, and expressed concern, because her other brother passed away a year ago, and she does not want this to happen to [place]en. She asked to speak with social worker, but Child psychotherapist already left for today. If the social worker on call can call her Ula Lingo) on Saturday, it would be greatly appreciated. I will pass this request onto the night nurse.

## 2012-05-21 NOTE — Progress Notes (Addendum)
[facility] Progress Note  Subjective: No complaints.  Issue about placement and flexion right hip/leg.  Objective Filed Vitals:   05/20/12 2210 05/21/12 0018 05/21/12 0526 05/21/12 0955  BP: 178/76 207/87 183/70 167/80  Pulse:  72 65 68  Temp:  98.4 F (36.9 C) 98.2 F (36.8 C) 98.2 F (36.8 C)  TempSrc:  Oral Oral Oral  Resp:  18 18 18   Height:      Weight:      SpO2:  100% 96% 100%   Physical Exam General: NAD Heart: RRR Lungs: no wheezes or rales Abdomen: wound not examined; draining bloody dark liquid Extremities: no LE edema; SCDs in place Dialysis Access: left thigh graft  Assessment/Plan: 1. 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 ESRD - no hep HD per surg request; will try to change to TIW HD, but not sure this will work, may still require 4x/wk to keep vol down. Had UF 3.9 yesterday with post wt of 51.2. K low at 3.3 even with 4 K bath.  ? Increase K in TNA. Try on TTS schedule for now - Hd orders written for Saturday. 3. Anemia -10.7 hgb on aranesp 200 no iron 4. HPTH- phos low, 2.2 has had replacement,- redose No binders, no Zemplar 2.25 Ca bath today 5. HTN/volume - max uf 4-5 kg each HD; taking in 2 - 2.6 liters per day.       6.   Malnutrition = On TNA Alb 1.5, but prealbumin 23.4        7.  Gout - stable No pain on Solu-Cortef  Sheffield Slider, PA-C [facility] Beeper [phone]  05/21/2012,10:35 AM  LOS: 49 days   Patient seen and examined and agree with assessment and plan as above with additions as indicated.  Stephen Moselle  MD [facility] [phone] pgr    [phone] cell 05/21/2012, 12:04 PM     Additional Objective Labs: Basic Metabolic Panel:  Lab 05/21/12 9629 05/20/12 0600 05/19/12 1000 05/18/12 0838  NA 133* 128* -- 134*  K 3.3* 3.1* -- 3.5  CL 96 93* -- 96  CO2 24 22 -- 24  GLUCOSE 109* 115* -- 91  BUN  40* 58* -- 47*  CREATININE 4.03* 5.28* -- 4.57*  CALCIUM 9.4 9.7 -- 9.6  ALB -- -- -- --  PHOS 2.2* 1.8* 1.9* --   Liver Function Tests:  Lab 05/20/12 0600 05/18/12 0838 05/17/12 0800  AST 37 37 32  ALT 32 28 26  ALKPHOS 156* 135* 134*  BILITOT 1.8* 1.9* 2.1*  PROT 5.2* 5.3* 5.0*  ALBUMIN 1.5* 1.5* 1.4*    CBC:  Lab 05/20/12 0600 05/17/12 0800 05/15/12 0900  WBC 9.0 6.2 7.9  NEUTROABS -- 4.3 --  HGB 10.7* 10.5* 9.9*  HCT 32.4* 31.2* 29.4*  MCV 84.2 82.1 82.6  PLT 132* 105* 103*   CBG:  Lab 05/21/12 0747 05/21/12 0015 05/20/12 2113 05/20/12 1708 05/20/12 0021  GLUCAP 110* 141* 123* 144* 156*  Medications:    . fat emulsion 240 mL (05/19/12 1757)  . fat emulsion    . TPN (CLINIMIX) +/- additives 70 mL/hr at 05/19/12 1757  . TPN (CLINIMIX) +/- additives 70 mL/hr at 05/21/12 1000  . TPN (CLINIMIX) +/- additives        . antiseptic oral rinse  15 mL Mouth Rinse q12n4p  . chlorhexidine  15 mL Mouth Rinse BID  .  darbepoetin (ARANESP) injection - DIALYSIS  200 mcg Intravenous Q Sat-HD  . fentaNYL  37.5 mcg Transdermal Q72H  . fluconazole (DIFLUCAN) IV  400 mg Intravenous Custom  . heparin  40 Units/kg Dialysis Once in dialysis  . hydrocortisone sod succinate (SOLU-CORTEF) injection  25 mg Intravenous Daily  . insulin aspart  0-15 Units Subcutaneous Q8H  . lip balm   Topical BID  . metoprolol  5 mg Intravenous Q6H  . pantoprazole  40 mg Oral Q1200  . piperacillin-tazobactam (ZOSYN)  IV  2.25 g Intravenous Q8H  . sodium chloride  10-40 mL Intracatheter Q12H  . sodium glycerophosphate 0.9% NaCl IVPB  10 mmol Intravenous Once

## 2012-05-21 NOTE — Progress Notes (Signed)
   CARE MANAGEMENT NOTE 05/21/2012  Patient:  [name], [name]   Account Number:  [id number]  Date Initiated:  04/06/2012  Documentation initiated by:  Letha Cape  Subjective/Objective Assessment:   dx sbo  admit- lives alone. pta independent. patient is HD pt on Tues, Thur and Saturday.  He drives himself to his appts.     Action/Plan:   clamp ng tube, leave npo, check films and exam in am by surgery.   Anticipated DC Date:  04/26/2012   Anticipated DC Plan:  HOME W HOME HEALTH SERVICES      DC Planning Services  CM consult      Choice offered to / List presented to:             Status of service:  In process, will continue to follow Medicare Important Message given?   (If response is "NO", the following Medicare IM given date fields will be blank) Date Medicare IM given:   Date Additional Medicare IM given:    Discharge Disposition:    Per UR Regulation:  Reviewed for med. necessity/level of care/duration of stay  If discussed at Long Length of Stay Meetings, dates discussed:   04/13/2012  04/27/2012  04/29/2012  05/06/2012  05/11/2012  05/13/2012  05/20/2012    Comments:  05/21/2012  1200 Darlyne Russian RN, CCM  [phone] [facility]: referral for evaluation  [facility] Clinical liaison [phone] FAX   [phone]  05/20/2012  [address] RN, [place]  [phone] Patient scheduled for HD 4x/wk due to TPN. [facility] to re evaluate.  Per [facility] no HD beds  05/13/12 0900 Verdis Prime RN MSN CCM Wound drng heme positive.  Per surgery, further procedure not an option.  NG clamped, sips and chips. HD qd.  05/03/12 0830 Henrietta Mayo RN MSN CCM S/P drain placement 2/2 intra-abdominal abscess.  04/28/12 0940 Henrietta Mayo RN MSN CCM Pt accepted and will transfer to LTAC bed when appropriate.  04/26/12 0930 Henrietta Mayo RN MSN CCM Per NP, LTAC referral appropriate if pt remains where surgery can follow.  TC to [facility] admissions coordinator and eval requested.   Thrombectomy of (L) AV graft scheduled for a.m.  04-23-12 8:15am Avie Arenas, RNBSN - [phone] Back to surgery on 04-22-12 for dehiscensce - intubated post and returned to ICU.   04/21/2012 TPN started as pt unable to tol liquids, pt referred to [facility] for evaluation and possible admission. Johny Shock RN MPH, case manager [phone]   04/16/12 JULIE AMERSON,RN,BSN 1110 PT S/P EXPLORATORY LAP WITH SBO X 2 ON 04/14/12.  MET WITH PT TO DISCUSS DC PLANS.  HE LIVES ALONE AND IS INDEPENDENT. HE HAS A SISTER WHO HELPS HIM OCCASIONALLY.  MD: CONSIDER PT/OT CONSULTS TO DETERMINE DISCHARGE NEEDS.  THANKS!   04/13/12 11:54 Letha Cape RN, BSN [phone] NG tube out, trying to advance diet but having issues with emesis, if unalble to advance diet patient will probably need surgery.  NCM will continue to follow.   04/07/12 10:25 Letha Cape RN, BSN [phone] patient's NG tube has been clamped, patient will continue to be npo per surgery and they will check films and exam patient again tomorrow, hopefully he will not need surgery, if not surgery possibly looking at 2-3 more days per surgery.  04/06/12 16:00 Letha Cape RN, BSN [phone] patient lives alone, pta independent.  Patient has HD on Tues, Thurs, and Sats, he drives himself to these appts. Patient has medication coverage and transportation.

## 2012-05-21 NOTE — Progress Notes (Signed)
Wound beginning to improve, less drainage When fistula output from wound resolves, may restart PO's after upper GI study.  Will likely have to leave drain in place long term until ready for definitive surgery.  Wilmon Arms. Corliss Skains, MD, [facility]  05/21/2012 1:47 PM

## 2012-05-21 NOTE — Progress Notes (Signed)
Occupational Therapy Treatment Patient Details Name: [name] MRN: [id number] DOB: 02-24-1961 Today's Date: 05/21/2012 Time: 1478-2956 OT Time Calculation (min): 16 min  OT Assessment / Plan / Recommendation Comments on Treatment Session Pt did well with Level 1 theraband exercises    Follow Up Recommendations  LTACH    Barriers to Discharge       Equipment Recommendations  Defer to next venue    Recommendations for Other Services    Frequency Min 1X/week   Plan Discharge plan remains appropriate    Precautions / Restrictions Precautions Precaution Comments: right groin triple lumen cathter, NO HIP FLEXION ON RIGHT Restrictions Weight Bearing Restrictions: No       ADL       OT Diagnosis:    OT Problem List:   OT Treatment Interventions:     OT Goals Arm Goals Arm Goal: Theraband Exercises - Progress: Progressing toward goal Miscellaneous OT Goals OT Goal: Miscellaneous Goal #1 - Progress: Discontinued (comment) (MD no longer wants [place] to try the tilt table) OT Goal: Miscellaneous Goal #2 - Progress: Discontinued (comment) (MD no longer wants [place] to get him up even with tilt table)  Visit Information  Last OT Received On: 05/21/12 Assistance Needed: +1    Subjective Data  Subjective: So if I am feeling ok in my abdomen tomorrow I do the whole regimen 2 times?----Yes   Prior Functioning       Cognition  Overall Cognitive Status: Appears within functional limits for tasks assessed/performed Arousal/Alertness: Awake/alert Orientation Level: Appears intact for tasks assessed Behavior During Session: [facility] for tasks performed    Mobility  Shoulder Instructions         Exercises  Other Exercises Other Exercises: Had pt complete 10 reps with Level 1 theraband BilUEs for each of the following: 1) reach towards the sky, 2) reach toward your feet, 3) reach towards the window with your left UE, 4) reach towards the door with your right UE, 5) reach across and  pull back. Had pt pay attention to how his abdomen was feeling due to abdominal wound. Informed him not to do any more today and see how his abdomin feels, if it feels fine then he can do 2 rounds of full routine tomorrow., if not then  just stick with one round. Other Exercises: Did exercises with supervision   Balance     End of Session OT - End of Session Activity Tolerance: Patient tolerated treatment well Patient left: in bed       Evette Georges [phone] 05/21/2012, 11:26 AM

## 2012-05-21 NOTE — Progress Notes (Addendum)
PT Cancellation Note/ DISCHARGE NOTE  Treatment cancelled today due to further discussion with Dr. Malachi Bonds .  She talked with radiologist who approved tilt table, but no hip flexion (not even minimal amount as required to take a step).. In light of this limitation, do not believe tilt table is practical until he can progress his gait.  Dr. Malachi Bonds says pt. will have triple lumen catheter in right groin  until Sept. 16th.  Have asked her to reorder PT at that time as pt. may still be inhouse.  Will sign off at this point.  Ferman Hamming 05/21/2012, 10:20 AM Weldon Picking PT Acute Re[facility] Services [phone] Beeper [phone]

## 2012-05-21 NOTE — Progress Notes (Signed)
Left message for nurse at [facility] to ask when can the pt's staples be removed from his left groin. His surgery was performed on 04/27/12, and he received a thrombectomy of left AV graft with revision, by Dr. Edilia Bo. Will await call back.

## 2012-05-21 NOTE — Consult Note (Signed)
WOC consult Note Abd dressing was applied yesterday at 1400 when Eakin pouch removed.  Gauze is dry without further drainage.  Full thickness wound decreasing in size.  11X2.5X.3cm, beefy red.  Upper wound pink dry scar tissue.   Plan:  Aquacel to absorb drainage and provide antimicrobial benefits and promote healing.  Foam dressing to protect scar tissue to previous upper wound area.  If drainage resumes, could re-apply Eakin pouch at that time if necessary.  Cammie Mcgee, RN, MSN, [employer]  [phone]

## 2012-05-21 NOTE — Progress Notes (Signed)
PARENTERAL NUTRITION CONSULT NOTE - FOLLOW UP  Pharmacy Consult for TPN Indication: SBO with new EC fistula  Allergies  Allergen Reactions  . Allopurinol     REACTION: decreased platelets  . Aspirin     REACTION: unspecified    Patient Measurements: Height: 5\' 11"  (180.3 cm) Weight: 112 lb 14 oz (51.2 kg) IBW/kg (Calculated) : 75.3    Vital Signs: Temp: 98.2 F (36.8 C) (08/30 0526) Temp src: Oral (08/30 0526) BP: 183/70 mmHg (08/30 0526) Pulse Rate: 65  (08/30 0526) Intake/Output from previous day: 08/29 0701 - 08/30 0700 In: 2615 [IV Piggyback:560; TPN:2040] Out: 3990 [Drains:20] Intake/Output from this shift: Total I/O In: 5 [Other:5] Out: -   Labs:  Basename 05/20/12 0600  WBC 9.0  HGB 10.7*  HCT 32.4*  PLT 132*  APTT --  INR --     Basename 05/21/12 0631 05/20/12 0600 05/19/12 1000  NA 133* 128* --  K 3.3* 3.1* --  CL 96 93* --  CO2 24 22 --  GLUCOSE 109* 115* --  BUN 40* 58* --  CREATININE 4.03* 5.28* --  LABCREA -- -- --  CREAT24HRUR -- -- --  CALCIUM 9.4 9.7 --  MG 1.5 1.5 --  PHOS 2.2* 1.8* 1.9*  PROT -- 5.2* --  ALBUMIN -- 1.5* --  AST -- 37 --  ALT -- 32 --  ALKPHOS -- 156* --  BILITOT -- 1.8* --  BILIDIR -- -- --  IBILI -- -- --  PREALBUMIN -- -- --  TRIG -- -- --  CHOLHDL -- -- --  CHOL -- -- --   Estimated Creatinine Clearance: 15.5 ml/min (by C-G formula based on Cr of 4.03).    Basename 05/21/12 0747 05/21/12 0015 05/20/12 2113  GLUCAP 110* 141* 123*   Insulin Requirements in the past 24 hours:  4 units Novolog SSI and 15 units regular insulin in TNA  Nutritional Goals:  1700-1950 kCal, 80-100 grams of protein per day   Current Nutrition:  Clinimix 5/15 at 70 ml/hr with 20% lipids at 10 ml/hr MWF provides an average of 1398 kcal and 84g protein per day  Admit: Admitted 7/13 with abd pain, weight loss, SBO. S/p LOA, SBR 7/24 and repeat LOA, SBR with anasomotic leak repair 8/1, thrombectomy with AVG revision 7/25 and  repeat thrombectomy 8/6. CT on 8/13 reveals enlarging abscess and enteric fistula.  GI: NGT out. Drains continue with significant drainage and bloody drainage. Tolerating TPN. Pt allowed to have ice chips and sips of water but doesn't appear to be any current plans to advance po diet. Pt's wt appears to be relatively stable. Endo: CBGs well controlled. Solu-Cortef daily.  Lytes: K 3.3, Na 133, Phos 2.2. Phos remains low despite replacement but has improved today. Hypercalcemia continues (corrected Ca 11.4, Ca x PO4 remains < 55). Renal MD replacing lytes. Renal: ESRD with HD typically TTS. HD is currently being administered prn d/t volume issues. Currently 4x/week HD while pt requiring TPN. On Aranesp qSat.  Cards: BP variable, currently 183/70, HR 65 (but with hypotension during HD). On scheduled lopressor and prn hydralazine.  Hepatobil: LFTs WNL 8/29 except alk phos/Tbili elevated. Prealbumin remains at goal. Trig WNL.  Neuro: GCS 15, no c/o pain - on fentanyl patch, prn pain meds.  ID: Fluconazole + Zosyn for peritonitis/abd abscess -- ID rec-cont current regimen for another mos with repeat CT scan to verify abscess smaller. With fistulas present, per ID may need indefinite abx with drains in place. Afeb,  WBC wnl. Abd abscess culture: candida tropicalis.  Zosyn 8.1>>  Diflucan 8.16>> (s/p 14 d mycamine) Best Practices: Mouthcare, PPI po  Plan:  1. Continue Clinimix 5/15 (no lytes) @ 70 ml/hr to avoid exacerbating volume status. MD/RD aware TPN not meeting kcal goals with Clinimix alone. May need to consider increasing TPN rate if pt unable to take po's/change to enteral feeds soon. However pt appears to be maintaining weight and prealbumin on current regimen. 2. Will defer electrolyte replacement to renal MD - may consider adding back lytes to TPN if lytes drop tomorrow 3. Provide available trace elements, MVI and lipids @10cc /hr on MWF only d/t national backorder  3. F/u BMET, Phos, Mg in  AM  Lysle Pearl, PharmD, BCPS Pager # [phone] 05/21/2012 9:20 AM

## 2012-05-21 NOTE — Progress Notes (Addendum)
[facility] PROGRESS NOTE  ADEN SEK ZOX:[id number] DOB: 1961/05/03 DOA: 04/02/2012 PCP: Trevor Iha, MD   Assessment/Plan: Principal Problem:  SBO (small bowel obstruction) s/p exploratory laparotomy and lysis of adhesions and partial small bowel x 2 on 7/24 *Subsequent anastomotic leak- omental patch of anastomosis 8/2  *CT abdomen and pelvis 04/29/2012 demonstrated several fluid collections consistent with intra-abdominal abscess with the largest measuring 16.5 x 13.7 x 6.4 cm.  *Patient underwent percutaneous drainage of largest fluid collection 04/30/2012  *Repeat CT done for increasing WBC count on 8/13 revealed enlarging abscess and enteric fistula and obstructed drainage tube. He underwent drain reposition on 8/14  *Has open abdom wound which is connecting with abscess cavity, slowly closing *Care per surgical team - continued on ice chips.  OTherwise NPO until fistula completely heals * Appreciate surgical team recommendations -  Will need upper GI series when fistula drainage stops and if negative for leak, may start slow PO * Continue IV nutrition   Peritonitis secondary to anastomotic dehiscence/intra-abdominal abscess  *Cont zosyn and diflucan until 9/16 and then repeat CT scan prior to discontinuing per ID. 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  Patient should not bend the right hip per interventional radiology.    Chronic nonocclusive left lower extremity DVT  *Diagnosed via Doppler 04/30/2012 - he continues with left lower extermity edema - f/u doppler is negative  CKD (chronic kidney disease) stage V requiring chronic dialysis  *Dialysis per nephrology   HTN (hypertension), elevated today but has tendency to drop precipitously (as it did yesterday).  Will trend before starting anti-hypertensives.    Anemia *Stable at 10.5. Due to acute blood loss and due to chronic disease - see above regarding wound bleeding  *Has received a total of 4 units of packed red blood cells this admission  *Cont aranesp and ferric gluconate   Chronic use of steroids *Hydrocortisone now down to 25 mg daily. I will continue until patient is able to resume his oral steroids.   Leg graft occlusion **s/p thrombectomy 7/25 - unfortunately re clotted - has undergone an additional thrombectomy procedure with revision of arterial and graft on 04/27/2012  *Was being dialyzed via HD cath in right groin but now graft is being used again  *See discussion per vascular surgery concerning narrowing at the venous anastomosis  Severe protein calorie malnutrition *Patient cachectic at presentation  *Parenteral nutrition managed by surgery and pharmacy   Disposition  *As long as the patient remains on TNA, he would likely require dialysis 4 x  week.  * Pursuing LTAC for this patient at [facility] and per case management, patient may be able to go to LTAC if HD bed available even with 4X/wk HD.  Spoke with patient and his sister who stated they strongly prefer not to go to [facility] as they had a brother who died there.  Advised sister to schedule appointment to discuss this preference with social work.  Code Status: Full Family Communication: Not  applicable Disposition Plan:  Pending LTAC bed.  Renae Fickle  [facility] Pager [phone]. 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 He relates one day prior to admission he started having abdominal pain which progressively got worse it was mainly periumbilical nothing made it better or worse had no radiation. But in the middle of the night he took some TUMS and the pain did not improve. After this he started vomiting about 3 or 4 times. So he decided to come here to the emergency room. Here in the emergency room his pain was relieved by narcotics. A CT scan of the abdomen and pelvis was done that shows small bowel obstruction we were asked to mid and further evaluate.  Since his hospitalization the patient had exploratory laparotomy and lysis of adhesions as a result of a small bowel obstruction. He subsequently had an anastomotic leak with an abscess which is has necessitated an omental patching of the anastomosis and percutaneous drain placement. The patient had an elevation of his white blood cell count and CT of the abdomen revealed an enlarging abscess an enteric fistula was obstructed drainage tube necessitated repositioning of the percutaneous drain a percutaneous drain. Presently the patient has an open abdominal wound which is connected with the abscess cavity and is being managed by general surgery and wound ostomy care.   Consultants: General surgery Nephrology Infectious disease the  Procedures: 7/24 Ex-lap, lysis of adhesions, partial small bowel resection x2  7/25 Thrombectomy L femoral AV graft  7/26 Extubated, CVL is out  7/27 Transferred to telemetry under [facility]  8/2 Acute abdomen, dehiscence of anastomosis, SBO, back from OR intubated in ICU on PCCM  service. Found to have moderate peritonitis infraumbilically  8/2 Extubated   Antibiotics: Zosyn 8/1 >>> (tentative stop date of 9/16 with CT scan at that time) Micafungin 8/3 >>8/16  Invanz 7/24 periop  Fluconazole- 8/2 + 8/17 >>  (tentative stop date of 9/16 with CT scan at that time) Vancomycin 8/8 >>> 05/01/2012   HPI/Subjective: Patient continues to feel okay.  Denies fevers, chills, nausea, vomiting, abdominal pain, chest pain, difficulty breathing.    Objective: Filed Vitals:   05/20/12 2105 05/20/12 2210 05/21/12 0018 05/21/12 0526  BP: 189/83 178/76 207/87 183/70  Pulse: 75  72 65  Temp: 98.4 F (36.9 C)  98.4 F (36.9 C) 98.2 F (36.8 C)  TempSrc: Oral  Oral Oral  Resp: 18  18 18   Height:      Weight: 51.2 kg (112 lb 14 oz)     SpO2: 100%  100% 96%   Weight change: -2.8 kg (-6 lb 2.8 oz)  Intake/Output Summary (Last 24 hours) at 05/21/12 0815 Last data filed at 05/21/12 0600  Gross per 24 hour  Intake   2615 ml  Output   3990 ml  Net  -1375 ml    General:  Cachectic AAM. HEENT: PERRL, MM Heart: Regular rate and rhythm, without murmurs, rubs, gallops.  Lungs: Clear to auscultation. Abdomen: Soft, non-tender, active bowel sounds, nondistended.  Patient has dry dressing on abdomen that is clean and dry. Percutaneous drain draining brown material on right groin. Neuro: No focal neurological deficits noted . Musculoskeletal:  Decreased tone and bulk. Right groin vas-cath.  Data Reviewed: Basic Metabolic Panel:  Lab 05/21/12 4540 05/20/12 0600 05/19/12 1000 05/18/12 0838 05/17/12 0800 05/16/12 0500 05/15/12 0900  NA 133* 128* -- 134* 126* 129* --  K 3.3* 3.1* -- 3.5 3.3* 3.5 --  CL 96 93* -- 96 89* 92* --  CO2 24 22 -- 24 21 25  --  GLUCOSE 109* 115* -- 91 90 103* --  BUN 40* 58* -- 47* 78* 47* --  CREATININE 4.03* 5.28* -- 4.57* 5.86* 3.88* --  CALCIUM 9.4 9.7 -- 9.6 9.5 9.3 --  MG 1.5 1.5 -- 1.6 -- -- --  PHOS 2.2* 1.8* 1.9* 1.5* -- -- 2.3   Liver  Function Tests:  Lab 05/20/12 0600 05/18/12 0838 05/17/12 0800 05/15/12 0900  AST 37 37 32 --  ALT 32 28 26 --  ALKPHOS 156* 135* 134* --  BILITOT 1.8* 1.9* 2.1* --  PROT 5.2* 5.3* 5.0* --  ALBUMIN 1.5* 1.5* 1.4* 1.5*   No results found for this basename: LIPASE:5,AMYLASE:5 in the last 168 hours No results found for this basename: AMMONIA:5 in the last 168 hours CBC:  Lab 05/20/12 0600 05/17/12 0800 05/15/12 0900  WBC 9.0 6.2 7.9  NEUTROABS -- 4.3 --  HGB 10.7* 10.5* 9.9*  HCT 32.4* 31.2* 29.4*  MCV 84.2 82.1 82.6  PLT 132* 105* 103*   Cardiac Enzymes: No results found for this basename: CKTOTAL:5,CKMB:5,CKMBINDEX:5,TROPONINI:5 in the last 168 hours BNP (last 3 results) No results found for this basename: PROBNP:3 in the last 8760 hours CBG:  Lab 05/21/12 0747 05/21/12 0015 05/20/12 2113 05/20/12 1708 05/20/12 0021  GLUCAP 110* 141* 123* 144* 156*    No results found for this or any previous visit (from the past 240 hour(s)).   Studies: Ct Angio Chest Pe W/cm &/or Wo Cm  04/25/2012  *RADIOLOGY REPORT*  Clinical Data: Chest pain and cough.  CT ANGIOGRAPHY CHEST  Technique:  Multidetector CT imaging of the chest using the standard protocol during bolus administration of intravenous contrast. Multiplanar reconstructed images including MIPs were obtained and reviewed to evaluate the vascular anatomy.  Contrast: OMNIPAQUE IOHEXOL 350 MG/ML SOLN  Comparison: None.  Findings: Technically adequate study with good opacification of the central and segmental pulmonary arteries.  No focal filling defects.  No evidence of significant pulmonary embolus.  Normal heart size.  Normal caliber thoracic aorta without dissection. Aortic and coronary artery calcifications.  Enteric tube with tip below the level of the stomach, not visualized.  Small amount of mucous in the trachea.  Airways generally appear patent.  There is a small left pleural effusion with basilar atelectasis or consolidation  on the left.  Vague patchy infiltration in the left upper lung may represent edema or pneumonia.  No pneumothorax. There is pneumoperitoneum noted in the upper abdomen.  This likely results from recent surgery with noted laparotomy on 04/22/2012. Normal alignment of the thoracic spine.  IMPRESSION: No evidence of significant pulmonary embolus or aortic dissection. Small left pleural effusion with basilar atelectasis or consolidation.  Vague patchy infiltration in the left upper lung. Abdominal pneumoperitoneum consistent with recent surgery.  Original Report Authenticated By: Marlon Pel, M.D.   Ct Guided Abscess Drain  04/30/2012  *RADIOLOGY REPORT*  Indication: Post surgery for small bowel obstruction, now with anastomotic leak and large intra-abdominal abscess.  CT GUIDED RIGHT LOWER QUADRANT ABDOMINAL DRAINAGE CATHETER PLACEMENT  Comparison: CT abdomen pelvis - 04/29/2012  Medications: Fentanyl 75 mcg IV; Versed  1.5 mg IV  Total Moderate Sedation time: 20 minutes  Contrast: None  Complications: None immediate  Technique / Findings:  Informed written consent was obtained from the patient after a discussion of the risks, benefits and alternatives to treatment. The patient was placed supine on the CT gantry and a pre procedural CT was performed re-demonstrating the known abscess/fluid collection within the right lower abdominal quadrant.  The procedure was planned.   A timeout was performed prior to the initiation of the procedure.  The skin overlying the right lower abdomen was prepped and draped in the usual sterile fashion.   The overlying soft tissues were anesthetized with 1% lidocaine with epinephrine.  An 18 gauge trocar needle was advanced in to the abscess/fluid collection and a Delmo Matty Amplatz super stiff wire was coiled within the abscess/fluid collection.   Appropriate positioning was confirmed with a limited CT scan.  The tract was serially dilated allowing placement of a 10 [place] all-purpose  drainage catheter.  Appropriate positioning was confirmed with a limited postprocedural CT scan.  200 ml of bloody, slightly purulent fluid was aspirated.  The tube was connected to a drainage bag and sutured in place.  A dressing was placed.  The patient tolerated the procedure well without immediate post procedural complication.  Impression:  Successful CT guided placement of a 10 [place] all purpose drain catheter into the right lower abdomen with aspiration of 200 mL of bloody, slightly purulent fluid.  Samples were sent to the laboratory as requested by the ordering clinical team.  Original Report Authenticated By: Waynard Reeds, M.D.   Ct Abdomen Pelvis W Contrast  05/04/2012  *RADIOLOGY REPORT*  Clinical Data: Status post surgery to treat the small bowel obstruction.  A right lower quadrant abscess was treated with percutaneous drainage on 04/30/2012.  CT ABDOMEN AND PELVIS WITH CONTRAST  Technique:  Multidetector CT imaging of the abdomen and pelvis was performed following the standard protocol during bolus administration of intravenous contrast.  Contrast:  75 ml Omnipaque-300 IV  Comparison: 04/30/2012 and 04/29/2012  Findings: Visualized lung bases show a left basilar pleural effusion and consolidation/atelectasis of the posterior left lower lobe.  There is extravasation of oral contrast from the small bowel into a residual abscess cavity within the right lower quadrant of the abdomen.  The percutaneous drain is located in the posterior/dependent aspect of the cavity and shows some surrounding debris which may be acting to occlude/partially occlude the catheter.  The abscess cavity remains of significant size, measuring over 10 cm in greatest dimensions.  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  Consider repositioning/upsizing of the current percutaneous drain or  placement of a second drain into the more anterior aspect of the residual collection.  No new abscess collections are identified. There is no evidence of gross free intraperitoneal air.  Solid organs of the abdomen are stable in appearance.  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 Indwelling percutaneous drain lies in the dependent portion of this collection which may contain debris obstructing the catheter. Given the large residual collection present, further drain  manipulation including potential repositioning and upsizing of the current drain and also potentially placement of a second more anteriorly positioned drain may be helpful.  This will be communicated to the interventional Radiology service.  Original Report Authenticated By: Reola Calkins, M.D.   Ct Abdomen Pelvis W Contrast  04/29/2012  *RADIOLOGY REPORT*  Clinical Data: Post surgery for small bowel obstruction in an anastomotic leak.  Leukocytosis.  Dialysis patient.  CT ABDOMEN AND PELVIS WITH CONTRAST  Technique:  Multidetector CT imaging of the abdomen and pelvis was performed following the standard protocol during bolus administration of intravenous contrast.  Contrast: OMNIPAQUE IOHEXOL 300 MG/ML  SOLN  Comparison: 04/03/2012  Findings: New small left pleural effusion with basilar atelectasis or consolidation on the left.  Enteric tube with tip in the distal stomach.  The liver and spleen appear homogeneous without enlargement.  Portal and mesenteric vessels appear patent.  The gallbladder is moderately distended and is filled with increased density material suggesting vicarious contrast excretion. Pancreatic duct is diffusely dilated as  seen previously.  No obstructing mass or stone is visualized.  Consider MRCP for further evaluation.  Extensive calcification of the abdominal aorta, iliac vessels, and splenic artery.  Calcification of the mesenteric artery with flow demonstrated.  Left adrenal gland nodule measuring 14 mm diameter.  This appears stable since the previous study but density measures are indeterminate.  No significant retroperitoneal lymphadenopathy.  Bilateral renal atrophy with multiple bilateral renal cysts.  No hydronephrosis.  The stomach, small bowel, and colon are decompressed.  No residual small bowel obstruction. Interval postoperative changes with midline surgical defect along the anterior abdominal wall and skin clips in the left groin region.  There is interval development of a large gas and fluid collections in the right pelvis extending from anteriorly to posteriorly and extending up into the right lower quadrant.  This collection measures up to about 16.5 x 13.7 x 6.4 cm.  The collection has a mildly thickened enhancing wall and air-fluid levels are present.  The appearance is consistent with loculated fluid or abscess.  There are additional loculated fluid collections in the anterior right lower quadrant measuring 3.3 x .9 centimeters and between the medial ascending colon and psoas muscle measuring 2.6 x 2.6 cm.  Sulci are consistent with abscesses.  Probably loculated fluid collection at the tip of the liver inferiorly. Infiltration into the mesenteric fat in the upper abdomen around the spleen and liver may represent edema.  No loculation is apparent.  Pelvis:  Pelvic transplant kidney with diffuse atrophy and prominent renal sinus fat.  No hydronephrosis.  Vascular calcification in the graft.  There  is a vascular graft extending from the common femoral vein inferiorly and from the common femoral artery laterally on the left.  Extensive calcification of the native femoral arteries bilaterally with suggestion of  significant stenosis of the right common femoral artery.  Edema in the subcutaneous soft tissues particularly on the left.  Normal alignment of the lumbar vertebra.  IMPRESSION: Interval postoperative changes.  No residual bowel obstruction. Multiple loculated fluid collections including a very large right abdominal and pelvic collection consistent with abscesses. Vicarious contrast excretion into the gallbladder.  New left pleural effusion and basilar atelectasis.  Additional incidental findings are stable since the previous study.  Results were telephoned to and Marchelle Folks, the patient's nurse on [facility]- [id number] at the time of dictation, 2129 hours on 04/29/2012.  Original Report Authenticated By: Marlon Pel, M.D.   Ir Catheter Tube  Change  05/05/2012  *RADIOLOGY REPORT*  Clinical history:51 year old with multiple medical problems including a small bowel leak.  The patient has a right lower quadrant drain which is incompletely draining the large abscess collection.  PROCEDURE(S): EXCHANGE AND REPOSITIONING OF THE PERCUTANEOUS DRAINAGE CATHETER WITH FLUOROSCOPY  Physician: Rachelle Hora. Henn, MD  Medications:Versed 2 mg, Fentanyl 25 mcg. A radiology nurse monitored the patient for moderate sedation.  Moderate sedation time:22 minutes  Fluoroscopy time: 3.8 minutes  Procedure:Informed consent was obtained for a drain repositioning and exchange.  The patient was placed supine on the interventional table.  The existing catheter in the right lower quadrant was prepped and draped in a sterile fashion.  Maximal barrier sterile technique was utilized including caps, mask, sterile gowns, sterile gloves, sterile drape, hand hygiene and skin antiseptic.  Catheter was injected with contrast and the catheter was cut and removed over a Bentson wire.  A Kumpe catheter was advanced into the upper portion of the collection.  Position was confirmed with ultrasound. A 12-French biliary drain was advanced over a Bentson wire and into the  superior aspect the collection.  Greater than 200 ml of dark brown fluid was removed.  Catheter sutured to the skin and attached to a gravity bag.  Findings:The old catheter was located within the inferior posterior aspect of the abscess collection.  Adjacent to the collection, there is high density material which is probably related to the small bowel fistula or leak.  Catheter and wire were advanced into the anterior and superior aspect of the collection.  Biliary drain was also advanced into the superior aspect of the collection. Greater than 200 ml of dark brown fluid was removed.  Complications: None  Impression:Successful exchange and repositioning of the drainage catheter.  The large abscess was successfully decompressed following placement of the new catheter.  Original Report Authenticated By: Richarda Overlie, M.D.   Ir Fluoro Guide Cv Line Left  04/23/2012  *RADIOLOGY REPORT*  Clinical Data/Indication: BOWEL OBSTRUCTION.  CENTRAL VENOUS ACCESS REQUESTED.  IR LEFT FLOURO GUIDE CV LINE  Fluoroscopy Time: 2.5 minutes.  Procedure: The procedure, risks, benefits, and alternatives were explained to the patient. Questions regarding the procedure were encouraged and answered. The patient understands and consents to the procedure.  The right neck was prepped with betadine in a sterile fashion, and a sterile drape was applied covering the operative field. A sterile gown and sterile gloves were used for the procedure.  Sonographic imaging over the right neck demonstrate a collateral venous structures only without a viable large central venous structure.  Under sonographic guidance, a micropuncture needle was inserted into one of the collateral vessels and removed over a 0018 wire.  The wire could not be advanced centrally.  Central venous access from the right neck was aborted.  Subsequently, under sonographic guidance, a micropuncture needle was inserted into the left internal jugular vein and removed over a 0.8 wire.   A 3-French dilator was inserted.  Central venography confirms central venous occlusion.  Findings: Central venous occlusion into the SVC is demonstrated. Center venous access was not placed.  Complications: None.  IMPRESSION: Central venous access was not placed.  Upper extremity and internal jugular vein center venous access is chronically occluded.  Original Report Authenticated By: Donavan Burnet, M.D.   Ir US Guide Vasc Access Left  04/23/2012  Please refer to accession number [id number].  Original Report Authenticated By: Donavan Burnet, M.D.   Dg Chest Port 1 View  05/05/2012  *RADIOLOGY  REPORT*  Clinical Data: Chest pain.  PORTABLE CHEST - 1 VIEW  Comparison: 04/28/2012  Findings: Nasogastric tube extends at least as   the   stomach, tip not seen.  Left lower lung consolidation / atelectasis has partially improved since the previous exam.  Right lung remains clear.  Probable small left pleural effusion.  Regional bones unremarkable.  IMPRESSION:  1.  Slight improvement in the left lower lung consolidation / atelectasis, with persistent small effusion.  Original Report Authenticated By: Osa Craver, M.D.   Dg Chest Port 1 View  04/28/2012  *RADIOLOGY REPORT*  Clinical Data: Progressive leukocytosis.  PORTABLE CHEST - 1 VIEW  Comparison: Chest x-ray dated 04/23/2012 and chest CT dated 04/25/2012  Findings: There is a persistent moderate left effusion with compressive atelectasis in the left lower lobe.  Heart size and vascularity are normal.  Right lung is clear.  NG tube tip is below the diaphragm.  IMPRESSION: Progressive left pleural effusion and left lower lobe atelectasis.  Original Report Authenticated By: Gwynn Burly, M.D.   Dg Chest Port 1 View  04/23/2012  *RADIOLOGY REPORT*  Clinical Data: Endotracheal tube placed  PORTABLE CHEST - 1 VIEW  Comparison: 04/15/2012  Findings: Endotracheal tube tip 7.2 cm proximal to the carina. Interval removal of the left IJ catheter.   Hyperinflated lungs. Mild retrocardiac opacity and left hemidiaphragm elevation.  No pneumothorax.  A enteric tube descends into the abdomen, tip not visualized.  There is nonspecific metallic density projecting over the left upper quadrant of the abdomen.  No acute osseous finding.  IMPRESSION: Endotracheal and enteric tubes as above.  Retrocardiac opacity; atelectasis, aspiration, or infiltrate.  Hyperinflation.  Original Report Authenticated By: Waneta Martins, M.D.   Dg Abd 2 Views  04/20/2012  *RADIOLOGY REPORT*  Clinical Data: Postop abdominal distention, recent of abdominal surgery for small bowel obstruction and lysis of the patient with partial small bowel resection  ABDOMEN - 2 VIEW  Comparison: Portable abdomen film of 04/16/2012  Findings: Both large and small bowel gas is present which may indicate postoperative ileus.  However there does appear be more distention of small bowel loops and a partial small bowel obstruction cannot be excluded.  Some contrast is scattered throughout the bowel.  Arterial calcifications are noted.  IMPRESSION: Distention of both large and small bowel may indicate ileus, but a partial small bowel obstruction cannot be excluded.  Original Report Authenticated By: Juline Patch, M.D.   Dg Abd Portable 1v  04/28/2012  *RADIOLOGY REPORT*  Clinical Data: Abdominal pain.  Ileus.  Progressive leukocytosis.  PORTABLE ABDOMEN - 1 VIEW  Comparison: Radiographs dated 04/20/2012  Findings: NG tube tip is in the body of the stomach.  There are no dilated loops of bowel.  Double-lumen catheter is in the right femoral vein with the tips in the inferior vena cava.  Contrast is seen in the gallbladder.  There is also scattered high density material in the bowel.  IMPRESSION: No acute abnormalities.  Original Report Authenticated By: Gwynn Burly, M.D.   Dg Abd Portable 1v  04/16/2012  *RADIOLOGY REPORT*  Clinical Data: No bowel activity since exploratory laparoscopy 2 days ago  question ileus, right lower quadrant pain  PORTABLE ABDOMEN - 1 VIEW  Comparison: 04/13/2012  Findings: Nasogastric tube in stomach. Retained contrast in colon. Surgical clips in pelvis bilaterally. Scattered atherosclerotic calcifications. No bowel dilatation, bowel wall thickening, or evidence of obstruction. Bowel staple lines noted in right mid abdomen.  IMPRESSION: Nonobstructive bowel gas pattern. Resolution of small bowel distention since prior exam.  Original Report Authenticated By: Lollie Marrow, M.D.   Ir Shuntogram/ Fistulagram Left Mod Sed  05/10/2012  *RADIOLOGY REPORT*  Clinical Data: Left leg swelling.  Graft revision 2 weeks ago.  AV SHUNTOGRAM  Procedure:  The left thigh was prepped and draped in a sterile fashion.  An 18 gauge Angiocath was inserted into the AV graft. Contrast was injected.  The Angiocath was removed.  Hemostasis was achieved with direct pressure. No complication.  Findings: The arterial anastomosis and graft are widely patent. There is moderate narrowing at the venous anastomosis.  There is associated narrowing of the external iliac vein.  Central venous structures are otherwise patent  IMPRESSION: Venous anastomotic and external iliac vein narrowing.  Otherwise patent circuit.   Original Report Authenticated By: Donavan Burnet, M.D. ( 05/10/2012 13:05:15 )     Scheduled Meds:    . antiseptic oral rinse  15 mL Mouth Rinse q12n4p  . chlorhexidine  15 mL Mouth Rinse BID  . darbepoetin (ARANESP) injection - DIALYSIS  200 mcg Intravenous Q Sat-HD  . fentaNYL  37.5 mcg Transdermal Q72H  . fluconazole (DIFLUCAN) IV  400 mg Intravenous Custom  . heparin  40 Units/kg Dialysis Once in dialysis  . hydrocortisone sod succinate (SOLU-CORTEF) injection  25 mg Intravenous Daily  . insulin aspart  0-15 Units Subcutaneous Q8H  . lip balm   Topical BID  . metoprolol  5 mg Intravenous Q6H  . pantoprazole  40 mg Oral Q1200  . piperacillin-tazobactam (ZOSYN)  IV  2.25 g  Intravenous Q8H  . sodium chloride  10-40 mL Intracatheter Q12H  . sodium glycerophosphate 0.9% NaCl IVPB  10 mmol Intravenous Once  . DISCONTD: sodium phosphate  Dextrose 5% IVPB  10 mmol Intravenous Once   Continuous Infusions:    . fat emulsion 240 mL (05/19/12 1757)  . TPN (CLINIMIX) +/- additives 70 mL/hr at 05/19/12 1757  . TPN (CLINIMIX) +/- additives 70 mL/hr at 05/20/12 1712    Principal Problem:  *SBO (small bowel obstruction) s/p EL/LOA and SBR x 2 Active Problems:  CKD (chronic kidney disease) stage V requiring chronic dialysis  Thrombocytopenia  Gout  HTN (hypertension)  Ileus following gastrointestinal surgery  Chronic use of steroids  Malnutrition, calorie  Leg graft occlusion  Acute respiratory failure with hypoxia  Sinus tachycardia  Abscess of abdominal cavity  Wound dehiscence  Bleeding  Anemia

## 2012-05-21 NOTE — Progress Notes (Signed)
Subjective: Pt ok. No new c/o  Objective: Physical Exam: BP 183/70  Pulse 65  Temp 98.2 F (36.8 C) (Oral)  Resp 18  Ht 5\' 11"  (1.803 m)  Wt 112 lb 14 oz (51.2 kg)  BMI 15.74 kg/m2  SpO2 96% Drain intact, site clean, NT Not much actual liquid output, but gas continues in bag and tubing.   Labs: CBC  Basename 05/20/12 0600  WBC 9.0  HGB 10.7*  HCT 32.4*  PLT 132*   BMET  Basename 05/21/12 0631 05/20/12 0600  NA 133* 128*  K 3.3* 3.1*  CL 96 93*  CO2 24 22  GLUCOSE 109* 115*  BUN 40* 58*  CREATININE 4.03* 5.28*  CALCIUM 9.4 9.7   LFT  Basename 05/20/12 0600  PROT 5.2*  ALBUMIN 1.5*  AST 37  ALT 32  ALKPHOS 156*  BILITOT 1.8*  BILIDIR --  IBILI --  LIPASE --   PT/INR No results found for this basename: LABPROT:2,INR:2 in the last 72 hours   Studies/Results: No results found.  Assessment/Plan: RLQ abscess and controlled fistula  S/p perc drain 8/19 with upsize 8/14.     LOS: 49 days    Brayton El PA-C 05/21/2012 8:54 AM

## 2012-05-21 NOTE — Progress Notes (Signed)
Patient ID: [name], male   DOB: Jul 01, 1961, 51 y.o.   MRN: [id number]  24 Days Post-Op  Subjective: Pt wo complaints, denies abd pain, +flatus  Objective: Vital signs in last 24 hours: Temp:  [97.1 F (36.2 C)-98.4 F (36.9 C)] 98.2 F (36.8 C) (08/30 0526) Pulse Rate:  [65-94] 65  (08/30 0526) Resp:  [16-18] 18  (08/30 0526) BP: (97-207)/(57-87) 183/70 mmHg (08/30 0526) SpO2:  [96 %-100 %] 96 % (08/30 0526) Weight:  [111 lb 12.4 oz (50.7 kg)-112 lb 14 oz (51.2 kg)] 112 lb 14 oz (51.2 kg) (08/29 2105) Last BM Date: 05/18/12  Intake/Output from previous day: 08/29 0701 - 08/30 0700 In: 2615 [IV Piggyback:560; TPN:2040] Out: 3990 [Drains:20] Intake/Output this shift: Total I/O In: 5 [Other:5] Out: -   Physical Exam: General appearance: alert, cooperative and no distress Abdomen:soft,minimally tender, + BS, eakins pouch d/c'd and dressing in place  Perc drain: bloody enteric looking drainage.     Lab Results:   Basename 05/20/12 0600  WBC 9.0  HGB 10.7*  HCT 32.4*  PLT 132*   BMET  Basename 05/21/12 0631 05/20/12 0600  NA 133* 128*  K 3.3* 3.1*  CL 96 93*  CO2 24 22  GLUCOSE 109* 115*  BUN 40* 58*  CREATININE 4.03* 5.28*  CALCIUM 9.4 9.7   PT/INR No results found for this basename: LABPROT:2,INR:2 in the last 72 hours ABG No results found for this basename: PHART:2,PCO2:2,PO2:2,HCO3:2 in the last 72 hours  Studies/Results: No results found.  Anti-infectives: Anti-infectives     Start     Dose/Rate Route Frequency Ordered Stop   05/20/12 2000   fluconazole (DIFLUCAN) IVPB 400 mg        400 mg 200 mL/hr over 60 Minutes Intravenous Once per day on Mon Tue Thu Sat 05/19/12 1417     05/20/12 1700   piperacillin-tazobactam (ZOSYN) IVPB 2.25 g        2.25 g 100 mL/hr over 30 Minutes Intravenous Every 8 hours 05/20/12 1634     05/17/12 1600   fluconazole (DIFLUCAN) IVPB 200 mg        200 mg 100 mL/hr over 60 Minutes Intravenous  Once 05/17/12  1129 05/17/12 1940   05/08/12 1200   fluconazole (DIFLUCAN) IVPB 400 mg  Status:  Discontinued        400 mg 200 mL/hr over 60 Minutes Intravenous Every T-Th-Sa (Hemodialysis) 05/07/12 1752 05/19/12 1417   04/30/12 1400   vancomycin (VANCOCIN) 750 mg in sodium chloride 0.9 % 150 mL IVPB  Status:  Discontinued        750 mg 150 mL/hr over 60 Minutes Intravenous  Once 04/30/12 1212 04/30/12 1507   04/29/12 2330   vancomycin (VANCOCIN) 1,500 mg in sodium chloride 0.9 % 500 mL IVPB        1,500 mg 250 mL/hr over 120 Minutes Intravenous  Once 04/29/12 2259 04/30/12 0246   04/27/12 0000   ceFAZolin (ANCEF) IVPB 1 g/50 mL premix  Status:  Discontinued     Comments: Send with pt to OR      1 g 100 mL/hr over 30 Minutes Intravenous On call 04/26/12 0927 04/26/12 0928   04/24/12 1000   micafungin (MYCAMINE) 100 mg in sodium chloride 0.9 % 100 mL IVPB  Status:  Discontinued        100 mg 100 mL/hr over 1 Hours Intravenous Daily 04/24/12 0750 05/07/12 1713   04/23/12 1600   fluconazole (DIFLUCAN) IVPB 400 mg  400 mg 200 mL/hr over 60 Minutes Intravenous  Once 04/23/12 1428 04/23/12 1800   04/22/12 2200   piperacillin-tazobactam (ZOSYN) IVPB 3.375 g  Status:  Discontinued        3.375 g 100 mL/hr over 30 Minutes Intravenous 3 times per day 04/22/12 2120 04/22/12 2137   04/22/12 2200   piperacillin-tazobactam (ZOSYN) IVPB 2.25 g  Status:  Discontinued        2.25 g 100 mL/hr over 30 Minutes Intravenous 3 times per day 04/22/12 2140 05/20/12 0748   04/22/12 2157   gentamicin (GARAMYCIN) injection  Status:  Discontinued          As needed 04/22/12 2158 04/22/12 2335   04/22/12 2156   clindamycin (CLEOCIN) injection  Status:  Discontinued          As needed 04/22/12 2157 04/22/12 2335   04/22/12 2145   piperacillin-tazobactam (ZOSYN) IVPB 3.375 g        3.375 g 12.5 mL/hr over 240 Minutes Intravenous  Once 04/22/12 2141 04/23/12 0145   04/22/12 2130   gentamicin (GARAMYCIN) 240 mg,  clindamycin (CLEOCIN) 900 mg in sodium chloride irrigation 0.9 % 1,000 mL irrigation  Status:  Discontinued         Irrigation  Once 04/22/12 2119 04/23/12 0020   04/14/12 0830   ertapenem (INVANZ) 1 g in sodium chloride 0.9 % 50 mL IVPB        1 g 100 mL/hr over 30 Minutes Intravenous To Surgery 04/14/12 0816 04/14/12 0834   04/13/12 1359   ertapenem (INVANZ) 1 g in sodium chloride 0.9 % 50 mL IVPB  Status:  Discontinued        1 g 100 mL/hr over 30 Minutes Intravenous 60 min pre-op 04/13/12 1359 04/14/12 0816          Assessment/Plan: s/p Procedure(s) (LRB): THROMBECTOMY ARTERIOVENOUS GORE-TEX GRAFT (Left) REVISION OF ARTERIOVENOUS GORETEX GRAFT (Left)  1. Eakins pouch now d/c'd- continue dressing changes and IR drain.  2. Continue with sips of water and ice chips.  3. ? Continue ABX : Zosyn Will discuss with Dr. Corliss Skains. 4. ESRD - per nephrology, s/p revision, Perm cath in place (patient is on 4 day HD schedule secondary to TNA) 5. Continue  TNA 6. Placement for long term care, (Case managers note of 05/17/12 is seen and appreciated) will await outcome of that discussion.     LOS: 49 days    Kailynn Satterly 05/21/2012

## 2012-05-21 NOTE — Progress Notes (Signed)
ANTIBIOTIC CONSULT NOTE - FOLLOW UP  Pharmacy Consult for Fluconazole and Zosyn Indication: abdominal abscess with fistula  Allergies  Allergen Reactions  . Allopurinol     REACTION: decreased platelets  . Aspirin     REACTION: unspecified    Patient Measurements: Height: 5\' 11"  (180.3 cm) Weight: 112 lb 14 oz (51.2 kg) IBW/kg (Calculated) : 75.3   Vital Signs: Temp: 98.2 F (36.8 C) (08/30 0955) Temp src: Oral (08/30 0955) BP: 167/80 mmHg (08/30 0955) Pulse Rate: 68  (08/30 0955) Intake/Output from previous day: 08/29 0701 - 08/30 0700 In: 2685 [IV Piggyback:560; TPN:2110] Out: 3990 [Drains:20]  Labs:  Basename 05/21/12 0631 05/20/12 0600  WBC -- 9.0  HGB -- 10.7*  PLT -- 132*  LABCREA -- --  CREATININE 4.03* 5.28*   Assessment: 51 y.o. M on Zosyn + Fluconazole per ID recommendations for abdominal abscess. The patient continues to have a drain in place. 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 ID recommends to continue current treatment thru 06/07/12 (noted in Dr. Joan Mayans note) and then to re-evaluate  The patient has ESRD and was on HD T/Th/Sat PTA however has switched to 4x/week HD this admission while concurrently on TNA. This is now being switched back to 3x/week HD as they have been able to achieve better ultrafiltration. Next HD scheduled for Sat, 8/31. Will adjust fluconazole dose to match new HD schedule.  8/1 Zosyn>> 8/3 Micafungin >> 8/16 8/17 Fluconazole >>  Goal of Therapy:  Appropriate doses for renal function and infection  Plan:  1. Adjust Fluconazole to 400 mg IV after HD sessions on T/Th/Sat 2. Continue Zoysn 2.25g IV every 8 hours 3. Will continue to follow HD schedule/duration, culture results, LOT, and ID recommendatoins  Georgina Pillion, PharmD, BCPS Clinical Pharmacist Pager: [phone] 05/21/2012 10:54 AM

## 2012-05-21 NOTE — Progress Notes (Signed)
Staples from left thigh graft DC'd without complications after premedicating patient with morphine. Patient tolerated well. Katha Cabal Rianna 4:39 PM

## 2012-05-22 ENCOUNTER — Inpatient Hospital Stay (HOSPITAL_COMMUNITY): Payer: Medicare Other

## 2012-05-22 LAB — RENAL FUNCTION PANEL
Albumin: 1.5 g/dL — ABNORMAL LOW (ref 3.5–5.2)
BUN: 70 mg/dL — ABNORMAL HIGH (ref 6–23)
CO2: 20 mEq/L (ref 19–32)
Calcium: 9.7 mg/dL (ref 8.4–10.5)
Chloride: 92 mEq/L — ABNORMAL LOW (ref 96–112)
Creatinine, Ser: 5.91 mg/dL — ABNORMAL HIGH (ref 0.50–1.35)
GFR calc Af Amer: 11 mL/min — ABNORMAL LOW (ref >90)
GFR calc non Af Amer: 10 mL/min — ABNORMAL LOW (ref >90)
Glucose, Bld: 94 mg/dL (ref 70–99)
Phosphorus: 2.5 mg/dL (ref 2.3–4.6)
Potassium: 3.2 mEq/L — ABNORMAL LOW (ref 3.5–5.1)
Sodium: 127 mEq/L — ABNORMAL LOW (ref 135–145)

## 2012-05-22 LAB — CBC
HCT: 33.3 % — ABNORMAL LOW (ref 39.0–52.0)
Hemoglobin: 11.3 g/dL — ABNORMAL LOW (ref 13.0–17.0)
MCH: 27.6 pg (ref 26.0–34.0)
MCHC: 33.9 g/dL (ref 30.0–36.0)
MCV: 81.2 fL (ref 78.0–100.0)
Platelets: 137 10*3/uL — ABNORMAL LOW (ref 150–400)
RBC: 4.1 MIL/uL — ABNORMAL LOW (ref 4.22–5.81)
RDW: 18.2 % — ABNORMAL HIGH (ref 11.5–15.5)
WBC: 9.2 10*3/uL (ref 4.0–10.5)

## 2012-05-22 LAB — GLUCOSE, CAPILLARY: Glucose-Capillary: 107 mg/dL — ABNORMAL HIGH (ref 70–99)

## 2012-05-22 MED ORDER — INSULIN REGULAR HUMAN 100 UNIT/ML IJ SOLN
INTRAVENOUS | Status: AC
  Administered 2012-05-22: 18:00:00 via INTRAVENOUS
  Filled 2012-05-22: qty 2000

## 2012-05-22 MED ORDER — DARBEPOETIN ALFA-POLYSORBATE 100 MCG/0.5ML IJ SOLN
INTRAMUSCULAR | Status: AC
  Administered 2012-05-22: 100 ug via INTRAVENOUS
  Filled 2012-05-22: qty 0.5

## 2012-05-22 MED ORDER — DARBEPOETIN ALFA-POLYSORBATE 200 MCG/0.4ML IJ SOLN
INTRAMUSCULAR | Status: AC
  Filled 2012-05-22: qty 0.4

## 2012-05-22 MED ORDER — PANTOPRAZOLE SODIUM 40 MG IV SOLR
40.0000 mg | Freq: Every day | INTRAVENOUS | Status: DC
  Administered 2012-05-23 – 2012-06-03 (×11): 40 mg via INTRAVENOUS
  Filled 2012-05-22 (×12): qty 40

## 2012-05-22 MED ORDER — METOPROLOL TARTRATE 1 MG/ML IV SOLN
10.0000 mg | Freq: Four times a day (QID) | INTRAVENOUS | Status: DC
  Administered 2012-05-22 – 2012-05-24 (×4): 10 mg via INTRAVENOUS
  Filled 2012-05-22 (×15): qty 10

## 2012-05-22 MED ORDER — HYDROCORTISONE SOD SUCCINATE 100 MG IJ SOLR
20.0000 mg | Freq: Every day | INTRAMUSCULAR | Status: DC
  Administered 2012-05-23 – 2012-06-02 (×11): 20 mg via INTRAVENOUS
  Filled 2012-05-22 (×11): qty 0.4

## 2012-05-22 MED ORDER — DARBEPOETIN ALFA-POLYSORBATE 100 MCG/0.5ML IJ SOLN
100.0000 ug | INTRAMUSCULAR | Status: DC
  Administered 2012-05-22: 100 ug via INTRAVENOUS
  Filled 2012-05-22: qty 0.5

## 2012-05-22 NOTE — Progress Notes (Signed)
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 Allopurinol     REACTION: decreased platelets  . Aspirin     REACTION: unspecified    Patient Measurements: Height: 5\' 11"  (180.3 cm) Weight: 117 lb 4.6 oz (53.2 kg) IBW/kg (Calculated) : 75.3    Vital Signs: Temp: 97.2 F (36.2 C) (08/31 0850) Temp src: Oral (08/31 0453) BP: 149/81 mmHg (08/31 0905) Pulse Rate: 69  (08/31 0905) Intake/Output from previous day: 08/30 0701 - 08/31 0700 In: 2335 [P.O.:120; I.V.:70; IV Piggyback:150; TPN:1970] Out: 10 [Drains:10] Intake/Output from this shift:    Labs:  Basename 05/20/12 0600  WBC 9.0  HGB 10.7*  HCT 32.4*  PLT 132*  APTT --  INR --     Basename 05/21/12 0631 05/20/12 0600 05/19/12 1000  NA 133* 128* --  K 3.3* 3.1* --  CL 96 93* --  CO2 24 22 --  GLUCOSE 109* 115* --  BUN 40* 58* --  CREATININE 4.03* 5.28* --  LABCREA -- -- --  CREAT24HRUR -- -- --  CALCIUM 9.4 9.7 --  MG 1.5 1.5 --  PHOS 2.2* 1.8* 1.9*  PROT -- 5.2* --  ALBUMIN -- 1.5* --  AST -- 37 --  ALT -- 32 --  ALKPHOS -- 156* --  BILITOT -- 1.8* --  BILIDIR -- -- --  IBILI -- -- --  PREALBUMIN -- -- --  TRIG -- -- --  CHOLHDL -- -- --  CHOL -- -- --   Estimated Creatinine Clearance: 16.1 ml/min (by C-G formula based on Cr of 4.03).    Basename 05/22/12 0800 05/21/12 2356 05/21/12 1711  GLUCAP 106* 134* 132*   Insulin Requirements in the past 24 hours:  4 units Novolog SSI and 15 units regular insulin in TNA  Nutritional Goals:  1700-1950 kCal, 80-100 grams of protein per day   Current Nutrition:  Clinimix 5/15 at 70 ml/hr with 20% lipids at 10 ml/hr MWF provides an average of 1398 kcal and 84g protein per day  Admit: Admitted 7/13 with abd pain, weight loss, SBO. S/p LOA, SBR 7/24 and repeat LOA, SBR with anasomotic leak repair 8/1, thrombectomy with AVG revision 7/25 and repeat thrombectomy 8/6. CT  on 8/13 reveals enlarging abscess and EC fistula.  GI: NGT out. Drain o/p down to 25 ml yesterday. Tolerating TPN. Pt allowed to have ice chips and sips of water but doesn't appear to be any current plans to advance po diet. Pt's wt appears to be relatively stable. Noted CCS plan to do study soon to see if EC fistula healed and if healed, plan to start po diet. Endo: CBGs well controlled. Solu-Cortef daily.  Lytes: Awaiting labs today at HD. Hypercalcemia continues (corrected Ca 11.4, Ca x PO4 remains < 55). Renal MD replacing lytes. Renal: ESRD with HD typically TTS. HD is currently being administered prn d/t volume issues. Currently 4x/week HD while pt requiring TPN - Noted plan to try and cut back to T/T/S. On Aranesp qSat.   Cards: BP variable, currently high, HR 60s (continues with hypotension during HD). On scheduled lopressor and prn hydralazine.  Hepatobil: LFTs WNL 8/29 except alk phos/Tbili elevated. Prealbumin remains at goal. Trig WNL.  Neuro: GCS 15, no c/o pain - on fentanyl patch, prn pain meds.  ID: Fluconazole + Zosyn for peritonitis/abd abscess -- ID rec-cont current regimen for another mos with repeat CT scan to verify abscess smaller.  With fistulas present, per ID may need indefinite abx with drains in place. Afeb, WBC wnl. Abd abscess culture: candida tropicalis.  Zosyn 8.1>>  Diflucan 8.16>> (s/p 14 d mycamine) Best Practices: Mouthcare, PPI po  Plan:  1. Continue Clinimix 5/15 (no lytes) @ 70 ml/hr to avoid exacerbating volume status. MD/RD aware TPN not meeting kcal goals with Clinimix alone. May need to consider increasing TPN rate if pt unable to take po's/change to enteral feeds soon. However pt appears to be maintaining weight and prealbumin on current regimen. 2. Will defer electrolyte replacement to renal MD - may consider adding back lytes to TPN if lytes drop tomorrow 3. Provide available trace elements, MVI and lipids @10cc /hr on MWF only d/t national backorder  3.  F/u BMET, Phos, Mg in AM  Christoper Fabian, PharmD, BCPS Clinical pharmacist, pager [phone]  05/22/2012 9:46 AM

## 2012-05-22 NOTE — Progress Notes (Signed)
Patient ID: [name], male   DOB: 09-Jan-1961, 51 y.o.   MRN: [id number] 25 Days Post-Op  Subjective: Pt feels well.  No complaints.  No nausea.  No abdominal pain.  Objective: Vital signs in last 24 hours: Temp:  [98 F (36.7 C)-98.3 F (36.8 C)] 98.1 F (36.7 C) (08/31 0453) Pulse Rate:  [65-73] 68  (08/31 0617) Resp:  [18] 18  (08/31 0453) BP: (158-186)/(60-80) 186/60 mmHg (08/31 0617) SpO2:  [100 %] 100 % (08/31 0453) Weight:  [115 lb (52.164 kg)] 115 lb (52.164 kg) (08/30 2031) Last BM Date: 05/18/12  Intake/Output from previous day: 08/30 0701 - 08/31 0700 In: 2335 [P.O.:120; I.V.:70; IV Piggyback:150; TPN:1970] Out: 10 [Drains:10] Intake/Output this shift:    PE: Abd: soft, no drainage currently from abdominal fistula.  Wound is relatively clean in the central portion with some slough at the end.  Drain with some thin bloody appearing output.  Lab Results:   Basename 05/20/12 0600  WBC 9.0  HGB 10.7*  HCT 32.4*  PLT 132*   BMET  Basename 05/21/12 0631 05/20/12 0600  NA 133* 128*  K 3.3* 3.1*  CL 96 93*  CO2 24 22  GLUCOSE 109* 115*  BUN 40* 58*  CREATININE 4.03* 5.28*  CALCIUM 9.4 9.7   PT/INR No results found for this basename: LABPROT:2,INR:2 in the last 72 hours CMP     Component Value Date/Time   NA 133* 05/21/2012 0631   K 3.3* 05/21/2012 0631   CL 96 05/21/2012 0631   CO2 24 05/21/2012 0631   GLUCOSE 109* 05/21/2012 0631   BUN 40* 05/21/2012 0631   CREATININE 4.03* 05/21/2012 0631   CALCIUM 9.4 05/21/2012 0631   PROT 5.2* 05/20/2012 0600   ALBUMIN 1.5* 05/20/2012 0600   AST 37 05/20/2012 0600   ALT 32 05/20/2012 0600   ALKPHOS 156* 05/20/2012 0600   BILITOT 1.8* 05/20/2012 0600   GFRNONAA 16* 05/21/2012 0631   GFRAA 18* 05/21/2012 0631   Lipase  No results found for this basename: lipase       Studies/Results: No results found.  Anti-infectives: Anti-infectives     Start     Dose/Rate Route Frequency Ordered Stop   05/22/12 1200    fluconazole (DIFLUCAN) IVPB 400 mg        400 mg 200 mL/hr over 60 Minutes Intravenous Every T-Th-Sa (Hemodialysis) 05/21/12 1108     05/20/12 2000   fluconazole (DIFLUCAN) IVPB 400 mg  Status:  Discontinued        400 mg 200 mL/hr over 60 Minutes Intravenous Once per day on Mon Tue Thu Sat 05/19/12 1417 05/21/12 1108   05/20/12 1700  piperacillin-tazobactam (ZOSYN) IVPB 2.25 g       2.25 g 100 mL/hr over 30 Minutes Intravenous Every 8 hours 05/20/12 1634     05/17/12 1600   fluconazole (DIFLUCAN) IVPB 200 mg        200 mg 100 mL/hr over 60 Minutes Intravenous  Once 05/17/12 1129 05/17/12 1940   05/08/12 1200   fluconazole (DIFLUCAN) IVPB 400 mg  Status:  Discontinued        400 mg 200 mL/hr over 60 Minutes Intravenous Every T-Th-Sa (Hemodialysis) 05/07/12 1752 05/19/12 1417   04/30/12 1400   vancomycin (VANCOCIN) 750 mg in sodium chloride 0.9 % 150 mL IVPB  Status:  Discontinued        750 mg 150 mL/hr over 60 Minutes Intravenous  Once 04/30/12 1212 04/30/12 1507   04/29/12  2330   vancomycin (VANCOCIN) 1,500 mg in sodium chloride 0.9 % 500 mL IVPB        1,500 mg 250 mL/hr over 120 Minutes Intravenous  Once 04/29/12 2259 04/30/12 0246   04/27/12 0000   ceFAZolin (ANCEF) IVPB 1 g/50 mL premix  Status:  Discontinued     Comments: Send with pt to OR      1 g 100 mL/hr over 30 Minutes Intravenous On call 04/26/12 0927 04/26/12 0928   04/24/12 1000   micafungin (MYCAMINE) 100 mg in sodium chloride 0.9 % 100 mL IVPB  Status:  Discontinued        100 mg 100 mL/hr over 1 Hours Intravenous Daily 04/24/12 0750 05/07/12 1713   04/23/12 1600   fluconazole (DIFLUCAN) IVPB 400 mg        400 mg 200 mL/hr over 60 Minutes Intravenous  Once 04/23/12 1428 04/23/12 1800   04/22/12 2200   piperacillin-tazobactam (ZOSYN) IVPB 3.375 g  Status:  Discontinued        3.375 g 100 mL/hr over 30 Minutes Intravenous 3 times per day 04/22/12 2120 04/22/12 2137   04/22/12 2200   piperacillin-tazobactam  (ZOSYN) IVPB 2.25 g  Status:  Discontinued        2.25 g 100 mL/hr over 30 Minutes Intravenous 3 times per day 04/22/12 2140 05/20/12 0748   04/22/12 2157   gentamicin (GARAMYCIN) injection  Status:  Discontinued          As needed 04/22/12 2158 04/22/12 2335   04/22/12 2156   clindamycin (CLEOCIN) injection  Status:  Discontinued          As needed 04/22/12 2157 04/22/12 2335   04/22/12 2145  piperacillin-tazobactam (ZOSYN) IVPB 3.375 g       3.375 g 12.5 mL/hr over 240 Minutes Intravenous  Once 04/22/12 2141 04/23/12 0145   04/22/12 2130   gentamicin (GARAMYCIN) 240 mg, clindamycin (CLEOCIN) 900 mg in sodium chloride irrigation 0.9 % 1,000 mL irrigation  Status:  Discontinued         Irrigation  Once 04/22/12 2119 04/23/12 0020   04/14/12 0830   ertapenem (INVANZ) 1 g in sodium chloride 0.9 % 50 mL IVPB        1 g 100 mL/hr over 30 Minutes Intravenous To Surgery 04/14/12 0816 04/14/12 0834   04/13/12 1359   ertapenem (INVANZ) 1 g in sodium chloride 0.9 % 50 mL IVPB  Status:  Discontinued        1 g 100 mL/hr over 30 Minutes Intravenous 60 min pre-op 04/13/12 1359 04/14/12 0816           Assessment/Plan  1. 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 PCM/TNA  Plan: 1. Pt is stable.  Will need a study soon to determine if his EC fistula has healed.  If it has, then we can start trying to feed the patient.   LOS: 50 days    Fani Rotondo E 05/22/2012

## 2012-05-22 NOTE — Progress Notes (Signed)
[facility] Progress Note  Subjective: Rested pretty well.  Objective Filed Vitals:   05/22/12 0902 05/22/12 0905 05/22/12 1000 05/22/12 1030  BP: 168/70 149/81 168/85 161/84  Pulse:  69 73 71  Temp:      TempSrc:      Resp:      Height:      Weight:      SpO2:       Physical Exam no wt yet today General: comfortable Heart: RRR Lungs: no wheezes or rales Abdomen: thin, soft,  Extremities: no LE edema Dialysis Access: left thigh AVGG patent  Assessment/Plan: 1. 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 ESRD - no hep HD per surg request; will try to change to TIW HD,  TTS schedule for now - Hd today - check labs pre HD and use 4 K bath. 3. Anemia - Hgb 11.3 trending up nicely hgb on aranesp 200 no iron; will decrease the dose to 100 mcq today 4.  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 HTN/volume - max uf 4-5 kg each HD; taking in 2 - 2.6 liters per day, IV metoprolol 5 mg q 6 hours       6  . Malnutrition = On TNA Alb 1.5, but prealbumin 23.4 8/26       7. Gout - stable No pain on Solu-Cortef  Sheffield Slider, PA-C [facility] Beeper [phone]  05/22/2012,10:40 AM  Patient seen and examined and agree with assessment and plan as above. Overall improving, fistula output has dropped. Gen surg planning injection study of fistula and f/u CT early next week. HD today, max UF as tol. Vinson Moselle  MD [facility] [phone] pgr    [phone] cell 05/22/2012, 12:19 PM    LOS: 50 days    Additional Objective Labs: Basic Metabolic Panel:  Lab 05/21/12 2130 05/20/12 0600 05/19/12 1000 05/18/12 0838  NA 133* 128* -- 134*  K 3.3* 3.1* -- 3.5  CL 96 93* -- 96  CO2 24 22 -- 24  GLUCOSE 109* 115* -- 91  BUN 40* 58* -- 47*  CREATININE 4.03* 5.28* -- 4.57*  CALCIUM 9.4 9.7 -- 9.6    ALB -- -- -- --  PHOS 2.2* 1.8* 1.9* --   Liver Function Tests:  Lab 05/20/12 0600 05/18/12 0838 05/17/12 0800  AST 37 37 32  ALT 32 28 26  ALKPHOS 156* 135* 134*  BILITOT 1.8* 1.9* 2.1*  PROT 5.2* 5.3* 5.0*  ALBUMIN 1.5* 1.5* 1.4*   CBC:  Lab 05/22/12 0816 05/20/12 0600 05/17/12 0800  WBC 9.2 9.0 6.2  NEUTROABS -- -- 4.3  HGB 11.3* 10.7* 10.5*  HCT 33.3* 32.4* 31.2*  MCV 81.2 84.2 82.1  PLT 137* 132* 105*  CBG:  Lab 05/22/12 0800 05/21/12 2356 05/21/12 1711 05/21/12 0747 05/21/12 0015  GLUCAP 106* 134* 132* 110* 141*  Medications:    . fat emulsion 240 mL (05/21/12 1752)  . TPN (CLINIMIX) +/- additives 70 mL/hr at 05/21/12 1200  . TPN (CLINIMIX) +/- additives 70 mL/hr at 05/21/12 1751  . TPN (CLINIMIX) +/- additives        . antiseptic oral rinse  15 mL Mouth Rinse q12n4p  . chlorhexidine  15 mL Mouth Rinse BID  . darbepoetin      . darbepoetin (ARANESP) injection - DIALYSIS  200 mcg Intravenous Q Sat-HD  . fentaNYL  37.5 mcg Transdermal Q72H  . fluconazole (DIFLUCAN) IV  400 mg Intravenous Q T,Th,Sa-HD  . heparin  40 Units/kg Dialysis Once in dialysis  . hydrocortisone sod succinate (SOLU-CORTEF) injection  25 mg Intravenous Daily  . insulin aspart  0-15 Units Subcutaneous Q8H  . lip balm   Topical BID  . metoprolol  5 mg Intravenous Q6H  . pantoprazole  40 mg Oral Q1200  . piperacillin-tazobactam (ZOSYN)  IV  2.25 g Intravenous Q8H  . sodium chloride  10-40 mL Intracatheter Q12H  . DISCONTD: fluconazole (DIFLUCAN) IV  400 mg Intravenous Custom

## 2012-05-22 NOTE — Progress Notes (Signed)
CSW consulted by charge nurse about d/c to [facility] possibly today. After chart review with RN, sister [place] [phone]) stated that patient is not going to [facility]. CSW contacted Ms. Clovis Riley to form d/c plan. The d/c plan is the patient will be discharged to Ms. Mitchell's house with home health until patient is able to be independent. Ms. Clovis Riley is adamant the patient must be able to eat on his own prior to being d/c'ed home. Weekday CSW will continue to follow patient.  Lia Foyer, LCSWA [facility] Clinical Social Worker Contact #: [phone] (weekend)

## 2012-05-22 NOTE — Progress Notes (Signed)
General surgery attending note:  Patient interviewed and examined. Agree with evaluation treatment plan outlined by [name].  Patient's abdomen is generally soft and the drainage is minimal.  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 Once that is done, a trial of feeding can be considered.   Angelia Mould. Derrell Lolling, M.D., [facility] General and Minimally invasive Surgery Breast and Colorectal Surgery Office:   [phone] Pager:   [phone]

## 2012-05-22 NOTE — Progress Notes (Addendum)
[facility] PROGRESS NOTE  [name] WUJ:[id number] DOB: 18-May-1961 DOA: 04/02/2012 PCP: Trevor Iha, MD   Assessment/Plan:  Principal Problem:  *SBO (small bowel obstruction) s/p EL/LOA and SBR x 2 Active Problems:  CKD (chronic kidney disease) stage V requiring chronic dialysis  Thrombocytopenia  Gout  HTN (hypertension)  Ileus following gastrointestinal surgery  Chronic use of steroids  Malnutrition, calorie  Leg graft occlusion  Acute respiratory failure with hypoxia  Sinus tachycardia  Abscess of abdominal cavity  Wound dehiscence  Bleeding  Anemia   SBO (small bowel obstruction) s/p exploratory laparotomy and lysis of adhesions and partial small bowel x 2 on 7/24.  Patient had subsequent anastomotic leak- omental patch of anastomosis on 8/2.  On 8/8, CT abdomen and pelvis demonstrated several fluid collections consistent with abscess, largest measuring 16.5 x 13.7 x 6.4 cm.  On 8/9, he underwent percutaneous drainage of largest fluid collection.  Repeat CT done for increasing WBC count on 8/13 revealed enlarging abscess and an enteric fistula with obstructed drainage tube. The drain was repositioned on 8/14.  Since that time, he has had an open abdominal wound which connects to an abscess cavity, which has been slowly closing.  General surgery has been following.  Per surgery, the right drain is likely to continue to drain fistula fluid and will require surgical correction.  The abdominal wound has been healing well and now that drainage is minimal, patient can undergo upper GI and repeat CT scan next week in preparation for starting enteric feeds. -  Appreciate surgery recommendations -  Continue ice chips and IV nutrition -  Await imaging early next week   Peritonitis secondary to anastomotic dehiscence/intra-abdominal abscess ID had previously been following and recommended zosyn and diflucan until 9/16 and then repeat CT scan prior to discontinuing  antibiotics.  Since the patient has been improving with near closure of the abdominal wound and will be undergoing CT next week, will talk to ID about duration of antibiotics after scan complete as may be able to discontinue sooner.  Ileus, resolved.  Patient has excellent bowel sounds and is passing gas.  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  Patient should not bend the right hip per interventional radiology.    Chronic nonocclusive left lower extremity DVT.  Diagnosed via Doppler 04/30/2012 and follow up on 8/21 showed no evidence of DVT, superficial thrombosis or baker's cyst.  Resolved.  CKD (chronic kidney disease) stage V requiring chronic dialysis Dialysis per nephrology   HTN (hypertension), elevated today but has tendency to drop precipitously.  Has been persistently elevated for the last several days and was over 200 systolic briefly yesterday.   -  Increase IV metoprolol today  Anemia *Stable at 10.5. Due to acute blood loss and due to chronic disease - see above regarding wound bleeding  *Has received a total of 4 units of packed red blood cells this admission  *Cont aranesp and ferric gluconate   Chronic use of steroids  (gout).  Patient had been on pred 5mg  daily. Weaned IV hydrocortisone.  Currently at 25 mg daily.    -  Wean to 20mg  daily  today (equivalent to pred 5) and continue until tolerating PO and try to wean off if tolerated.   Leg graft occlusion  s/p thrombectomy 7/25 - unfortunately re clotted and undergone an additional thrombectomy procedure with revision of  arterial and graft on 04/27/2012.  Was briefly dialyzed via HD cath in right groin but currently left femoral graft is being used again.  See discussion per vascular surgery concerning narrowing at the venous anastomosis  Severe protein calorie malnutrition *Patient cachectic at presentation  *Parenteral nutrition managed by surgery and pharmacy.  Patient is not receiving adequate calories because of fluid volume.  Anticipate nutritional status will improve once patient tolerating some enteric nutrition.     Disposition  *As long as the patient remains on TNA, he would likely require dialysis 4 x  week.  * Pursuing LTAC for this patient at [facility] and per case management, patient may be able to go to LTAC if HD bed available even with 4X/wk HD.  Spoke with patient and his sister who stated they strongly prefer not to go to [facility] as they had a brother who died there.  Advised sister to schedule appointment to discuss this preference with social work.  Code Status: Full Family Communication: Not applicable Disposition Plan:  Pending LTAC bed.   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 He relates one day prior to admission he started having abdominal pain which progressively got worse it was mainly periumbilical nothing made it better or worse had no radiation. But in the middle of the night he took some TUMS and the pain did not improve. After this he started vomiting about 3 or 4 times. So he decided to come here to the emergency room. Here in the emergency room his pain was relieved by narcotics. A CT scan of the abdomen and pelvis was done that shows small bowel obstruction we were asked to mid and further evaluate.  Since his hospitalization the patient had exploratory  laparotomy and lysis of adhesions as a result of a small bowel obstruction. He subsequently had an anastomotic leak with an abscess which is has necessitated an omental patching of the anastomosis and percutaneous drain placement. The patient had an elevation of his white blood cell count and CT of the abdomen revealed an enlarging abscess an enteric fistula was obstructed drainage tube necessitated repositioning of the percutaneous drain a percutaneous drain. Presently the patient has an open abdominal wound which is connected with the abscess cavity and is being managed by general surgery and wound ostomy care.   Consultants: General surgery Nephrology Infectious disease the  Procedures: 7/24 Ex-lap, lysis of adhesions, partial small bowel resection x2  7/25 Thrombectomy L femoral AV graft  7/26 Extubated, CVL is out  7/27 Transferred to telemetry under [facility]  8/2 Acute abdomen, dehiscence of anastomosis, SBO, back from OR intubated in ICU on PCCM service. Found to have moderate peritonitis infraumbilically  8/2 Extubated   Antibiotics: Zosyn 8/1 >>> (tentative stop date of 9/16 with CT scan at that time) Micafungin 8/3 >>8/16  Invanz 7/24 periop  Fluconazole- 8/2 + 8/17 >>  (tentative stop date of 9/16 with CT scan at that time) Vancomycin 8/8 >>> 05/01/2012   HPI/Subjective: Patient continues to feel okay although sleepy after finishing dialysis.  Denies fevers, chills,  nausea, vomiting, abdominal pain, chest pain, difficulty breathing.    Objective: Filed Vitals:   05/22/12 1100 05/22/12 1130 05/22/12 1200 05/22/12 1230  BP: 169/88 160/91 156/82 157/88  Pulse: 78 87 69 70  Temp:    97 F (36.1 C)  TempSrc:    Oral  Resp:    19  Height:      Weight:      SpO2:    99%   Weight change: 1.464 kg (3 lb 3.6 oz)  Intake/Output Summary (Last 24 hours) at 05/22/12 1259 Last data filed at 05/22/12 1230  Gross per 24 hour  Intake   1925 ml  Output   3062 ml  Net  -1137 ml     General:  Cachectic AAM. HEENT: PERRL, MM Heart: Regular rate and rhythm, without murmurs, rubs, gallops.  Lungs: Clear to auscultation. Abdomen: Soft, non-tender, active bowel sounds, nondistended.  Patient has dry dressing on abdomen that is clean and dry. Percutaneous drain draining serosanguinous material on right groin. Neuro: No focal neurological deficits noted . Musculoskeletal:  Decreased tone and bulk. Right groin vas-cath.  Data Reviewed: Basic Metabolic Panel:  Lab 05/22/12 0865 05/21/12 0631 05/20/12 0600 05/19/12 1000 05/18/12 0838 05/17/12 0800  NA 127* 133* 128* -- 134* 126*  K 3.2* 3.3* 3.1* -- 3.5 3.3*  CL 92* 96 93* -- 96 89*  CO2 20 24 22  -- 24 21  GLUCOSE 94 109* 115* -- 91 90  BUN 70* 40* 58* -- 47* 78*  CREATININE 5.91* 4.03* 5.28* -- 4.57* 5.86*  CALCIUM 9.7 9.4 9.7 -- 9.6 9.5  MG -- 1.5 1.5 -- 1.6 --  PHOS 2.5 2.2* 1.8* 1.9* 1.5* --   Liver Function Tests:  Lab 05/22/12 0816 05/20/12 0600 05/18/12 0838 05/17/12 0800  AST -- 37 37 32  ALT -- 32 28 26  ALKPHOS -- 156* 135* 134*  BILITOT -- 1.8* 1.9* 2.1*  PROT -- 5.2* 5.3* 5.0*  ALBUMIN 1.5* 1.5* 1.5* 1.4*   No results found for this basename: LIPASE:5,AMYLASE:5 in the last 168 hours No results found for this basename: AMMONIA:5 in the last 168 hours CBC:  Lab 05/22/12 0816 05/20/12 0600 05/17/12 0800  WBC 9.2 9.0 6.2  NEUTROABS -- -- 4.3  HGB 11.3* 10.7* 10.5*  HCT 33.3* 32.4* 31.2*  MCV 81.2 84.2 82.1  PLT 137* 132* 105*   Cardiac Enzymes: No results found for this basename: CKTOTAL:5,CKMB:5,CKMBINDEX:5,TROPONINI:5 in the last 168 hours BNP (last 3 results) No results found for this basename: PROBNP:3 in the last 8760 hours CBG:  Lab 05/22/12 0800 05/21/12 2356 05/21/12 1711 05/21/12 0747 05/21/12 0015  GLUCAP 106* 134* 132* 110* 141*    No results found for this or any previous visit (from the past 240 hour(s)).   Studies: Ct Angio Chest Pe W/cm &/or Wo Cm  04/25/2012   *RADIOLOGY REPORT*  Clinical Data: Chest pain and cough.  CT ANGIOGRAPHY CHEST  Technique:  Multidetector CT imaging of the chest using the standard protocol during bolus administration of intravenous contrast. Multiplanar reconstructed images including MIPs were obtained and reviewed to evaluate the vascular anatomy.  Contrast: OMNIPAQUE IOHEXOL 350 MG/ML SOLN  Comparison: None.  Findings: Technically adequate study with good opacification of the central and segmental pulmonary arteries.  No focal filling defects.  No evidence of significant pulmonary embolus.  Normal heart size.  Normal caliber thoracic aorta without dissection. Aortic and coronary artery calcifications.  Enteric tube with tip below the level of the  stomach, not visualized.  Small amount of mucous in the trachea.  Airways generally appear patent.  There is a small left pleural effusion with basilar atelectasis or consolidation on the left.  Vague patchy infiltration in the left upper lung may represent edema or pneumonia.  No pneumothorax. There is pneumoperitoneum noted in the upper abdomen.  This likely results from recent surgery with noted laparotomy on 04/22/2012. Normal alignment of the thoracic spine.  IMPRESSION: No evidence of significant pulmonary embolus or aortic dissection. Small left pleural effusion with basilar atelectasis or consolidation.  Vague patchy infiltration in the left upper lung. Abdominal pneumoperitoneum consistent with recent surgery.  Original Report Authenticated By: Marlon Pel, M.D.   Ct Guided Abscess Drain  04/30/2012  *RADIOLOGY REPORT*  Indication: Post surgery for small bowel obstruction, now with anastomotic leak and large intra-abdominal abscess.  CT GUIDED RIGHT LOWER QUADRANT ABDOMINAL DRAINAGE CATHETER PLACEMENT  Comparison: CT abdomen pelvis - 04/29/2012  Medications: Fentanyl 75 mcg IV; Versed 1.5 mg IV  Total Moderate Sedation time: 20 minutes  Contrast: None  Complications: None  immediate  Technique / Findings:  Informed written consent was obtained from the patient after a discussion of the risks, benefits and alternatives to treatment. The patient was placed supine on the CT gantry and a pre procedural CT was performed re-demonstrating the known abscess/fluid collection within the right lower abdominal quadrant.  The procedure was planned.   A timeout was performed prior to the initiation of the procedure.  The skin overlying the right lower abdomen was prepped and draped in the usual sterile fashion.   The overlying soft tissues were anesthetized with 1% lidocaine with epinephrine.  An 18 gauge trocar needle was advanced in to the abscess/fluid collection and a Melanee Cordial Amplatz super stiff wire was coiled within the abscess/fluid collection.   Appropriate positioning was confirmed with a limited CT scan.  The tract was serially dilated allowing placement of a 10 [place] all-purpose drainage catheter.  Appropriate positioning was confirmed with a limited postprocedural CT scan.  200 ml of bloody, slightly purulent fluid was aspirated.  The tube was connected to a drainage bag and sutured in place.  A dressing was placed.  The patient tolerated the procedure well without immediate post procedural complication.  Impression:  Successful CT guided placement of a 10 [place] all purpose drain catheter into the right lower abdomen with aspiration of 200 mL of bloody, slightly purulent fluid.  Samples were sent to the laboratory as requested by the ordering clinical team.  Original Report Authenticated By: Waynard Reeds, M.D.   Ct Abdomen Pelvis W Contrast  05/04/2012  *RADIOLOGY REPORT*  Clinical Data: Status post surgery to treat the small bowel obstruction.  A right lower quadrant abscess was treated with percutaneous drainage on 04/30/2012.  CT ABDOMEN AND PELVIS WITH CONTRAST  Technique:  Multidetector CT imaging of the abdomen and pelvis was performed following the standard protocol during  bolus administration of intravenous contrast.  Contrast:  75 ml Omnipaque-300 IV  Comparison: 04/30/2012 and 04/29/2012  Findings: Visualized lung bases show a left basilar pleural effusion and consolidation/atelectasis of the posterior left lower lobe.  There is extravasation of oral contrast from the small bowel into a residual abscess cavity within the right lower quadrant of the abdomen.  The percutaneous drain is located in the posterior/dependent aspect of the cavity and shows some surrounding debris which may be acting to occlude/partially occlude the catheter.  The abscess cavity remains of  significant size, measuring over 10 cm in greatest dimensions.  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  Consider repositioning/upsizing of the current percutaneous drain or placement of a second drain into the more anterior aspect of the residual collection.  No new abscess collections are identified. There is no evidence of gross free intraperitoneal air.  Solid organs of the abdomen are stable in appearance.  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 Indwelling percutaneous drain lies in the dependent portion of this collection which may contain debris obstructing the catheter. Given the large residual collection present, further drain manipulation including potential repositioning and upsizing of the current drain and also potentially placement of a second more anteriorly positioned drain may be helpful.  This will be communicated to the interventional Radiology service.  Original  Report Authenticated By: Reola Calkins, M.D.   Ct Abdomen Pelvis W Contrast  04/29/2012  *RADIOLOGY REPORT*  Clinical Data: Post surgery for small bowel obstruction in an anastomotic leak.  Leukocytosis.  Dialysis patient.  CT ABDOMEN AND PELVIS WITH CONTRAST  Technique:  Multidetector CT imaging of the abdomen and pelvis was performed following the standard protocol during bolus administration of intravenous contrast.  Contrast: OMNIPAQUE IOHEXOL 300 MG/ML  SOLN  Comparison: 04/03/2012  Findings: New small left pleural effusion with basilar atelectasis or consolidation on the left.  Enteric tube with tip in the distal stomach.  The liver and spleen appear homogeneous without enlargement.  Portal and mesenteric vessels appear patent.  The gallbladder is moderately distended and is filled with increased density material suggesting vicarious contrast excretion. Pancreatic duct is diffusely dilated as seen previously.  No obstructing mass or stone is visualized.  Consider MRCP for further evaluation.  Extensive calcification of the abdominal aorta, iliac vessels, and splenic artery.  Calcification of the mesenteric artery with flow demonstrated.  Left adrenal gland nodule measuring 14 mm diameter.  This appears stable since the previous study but density measures are indeterminate.  No significant retroperitoneal lymphadenopathy.  Bilateral renal atrophy with multiple bilateral renal cysts.  No hydronephrosis.  The stomach, small bowel, and colon are decompressed.  No residual small bowel obstruction. Interval postoperative changes with midline surgical defect along the anterior abdominal wall and skin clips in the left groin region.  There is interval development of a large gas and fluid collections in the right pelvis extending from anteriorly to posteriorly and extending up into the right lower quadrant.  This collection measures up to about 16.5 x 13.7 x 6.4 cm.  The collection has a mildly thickened  enhancing wall and air-fluid levels are present.  The appearance is consistent with loculated fluid or abscess.  There are additional loculated fluid collections in the anterior right lower quadrant measuring 3.3 x .9 centimeters and between the medial ascending colon and psoas muscle measuring 2.6 x 2.6 cm.  Sulci are consistent with abscesses.  Probably loculated fluid collection at the tip of the liver inferiorly. Infiltration into the mesenteric fat in the upper abdomen around the spleen and liver may represent edema.  No loculation is apparent.  Pelvis:  Pelvic transplant kidney with diffuse atrophy and prominent renal sinus fat.  No hydronephrosis.  Vascular calcification in the graft.  There  is a vascular graft extending from the common femoral vein inferiorly and from the common femoral artery laterally on the left.  Extensive calcification of the native femoral arteries bilaterally with suggestion of significant stenosis of the right common femoral artery.  Edema in the subcutaneous soft tissues particularly on the left.  Normal alignment of the lumbar vertebra.  IMPRESSION: Interval postoperative changes.  No residual bowel obstruction. Multiple loculated fluid collections including a very large right abdominal and pelvic collection consistent with abscesses. Vicarious contrast excretion into the gallbladder.  New left pleural effusion and basilar atelectasis.  Additional incidental findings are stable since the previous study.  Results were telephoned to and Marchelle Folks, the patient's nurse on [facility]- [id number] at the time of dictation, 2129 hours on 04/29/2012.  Original Report Authenticated By: Marlon Pel, M.D.   Ir Catheter Tube Change  05/05/2012  *RADIOLOGY REPORT*  Clinical history:51 year old with multiple medical problems including a small bowel leak.  The patient has a right lower quadrant drain which is incompletely draining the large abscess collection.  PROCEDURE(S): EXCHANGE AND REPOSITIONING  OF THE PERCUTANEOUS DRAINAGE CATHETER WITH FLUOROSCOPY  Physician: Rachelle Hora. Henn, MD  Medications:Versed 2 mg, Fentanyl 25 mcg. A radiology nurse monitored the patient for moderate sedation.  Moderate sedation time:22 minutes  Fluoroscopy time: 3.8 minutes  Procedure:Informed consent was obtained for a drain repositioning and exchange.  The patient was placed supine on the interventional table.  The existing catheter in the right lower quadrant was prepped and draped in a sterile fashion.  Maximal barrier sterile technique was utilized including caps, mask, sterile gowns, sterile gloves, sterile drape, hand hygiene and skin antiseptic.  Catheter was injected with contrast and the catheter was cut and removed over a Bentson wire.  A Kumpe catheter was advanced into the upper portion of the collection.  Position was confirmed with ultrasound. A 12-French biliary drain was advanced over a Bentson wire and into the superior aspect the collection.  Greater than 200 ml of dark brown fluid was removed.  Catheter sutured to the skin and attached to a gravity bag.  Findings:The old catheter was located within the inferior posterior aspect of the abscess collection.  Adjacent to the collection, there is high density material which is probably related to the small bowel fistula or leak.  Catheter and wire were advanced into the anterior and superior aspect of the collection.  Biliary drain was also advanced into the superior aspect of the collection. Greater than 200 ml of dark brown fluid was removed.  Complications: None  Impression:Successful exchange and repositioning of the drainage catheter.  The large abscess was successfully decompressed following placement of the new catheter.  Original Report Authenticated By: Richarda Overlie, M.D.   Ir Fluoro Guide Cv Line Left  04/23/2012  *RADIOLOGY REPORT*  Clinical Data/Indication: BOWEL OBSTRUCTION.  CENTRAL VENOUS ACCESS REQUESTED.  IR LEFT FLOURO GUIDE CV LINE  Fluoroscopy Time:  2.5 minutes.  Procedure: The procedure, risks, benefits, and alternatives were explained to the patient. Questions regarding the procedure were encouraged and answered. The patient understands and consents to the procedure.  The right neck was prepped with betadine in a sterile fashion, and a sterile drape was applied covering the operative field. A sterile gown and sterile gloves were used for the procedure.  Sonographic imaging over the right neck demonstrate a collateral  venous structures only without a viable large central venous structure.  Under sonographic guidance, a micropuncture needle was inserted into one of the collateral vessels and removed over a 0018 wire.  The wire could not be advanced centrally.  Central venous access from the right neck was aborted.  Subsequently, under sonographic guidance, a micropuncture needle was inserted into the left internal jugular vein and removed over a 0.8 wire.  A 3-French dilator was inserted.  Central venography confirms central venous occlusion.  Findings: Central venous occlusion into the SVC is demonstrated. Center venous access was not placed.  Complications: None.  IMPRESSION: Central venous access was not placed.  Upper extremity and internal jugular vein center venous access is chronically occluded.  Original Report Authenticated By: Donavan Burnet, M.D.   Ir US Guide Vasc Access Left  04/23/2012  Please refer to accession number [id number].  Original Report Authenticated By: Donavan Burnet, M.D.   Dg Chest Port 1 View  05/05/2012  *RADIOLOGY REPORT*  Clinical Data: Chest pain.  PORTABLE CHEST - 1 VIEW  Comparison: 04/28/2012  Findings: Nasogastric tube extends at least as   the   stomach, tip not seen.  Left lower lung consolidation / atelectasis has partially improved since the previous exam.  Right lung remains clear.  Probable small left pleural effusion.  Regional bones unremarkable.  IMPRESSION:  1.  Slight improvement in the left lower lung  consolidation / atelectasis, with persistent small effusion.  Original Report Authenticated By: Osa Craver, M.D.   Dg Chest Port 1 View  04/28/2012  *RADIOLOGY REPORT*  Clinical Data: Progressive leukocytosis.  PORTABLE CHEST - 1 VIEW  Comparison: Chest x-ray dated 04/23/2012 and chest CT dated 04/25/2012  Findings: There is a persistent moderate left effusion with compressive atelectasis in the left lower lobe.  Heart size and vascularity are normal.  Right lung is clear.  NG tube tip is below the diaphragm.  IMPRESSION: Progressive left pleural effusion and left lower lobe atelectasis.  Original Report Authenticated By: Gwynn Burly, M.D.   Dg Chest Port 1 View  04/23/2012  *RADIOLOGY REPORT*  Clinical Data: Endotracheal tube placed  PORTABLE CHEST - 1 VIEW  Comparison: 04/15/2012  Findings: Endotracheal tube tip 7.2 cm proximal to the carina. Interval removal of the left IJ catheter.  Hyperinflated lungs. Mild retrocardiac opacity and left hemidiaphragm elevation.  No pneumothorax.  A enteric tube descends into the abdomen, tip not visualized.  There is nonspecific metallic density projecting over the left upper quadrant of the abdomen.  No acute osseous finding.  IMPRESSION: Endotracheal and enteric tubes as above.  Retrocardiac opacity; atelectasis, aspiration, or infiltrate.  Hyperinflation.  Original Report Authenticated By: Waneta Martins, M.D.   Dg Abd 2 Views  04/20/2012  *RADIOLOGY REPORT*  Clinical Data: Postop abdominal distention, recent of abdominal surgery for small bowel obstruction and lysis of the patient with partial small bowel resection  ABDOMEN - 2 VIEW  Comparison: Portable abdomen film of 04/16/2012  Findings: Both large and small bowel gas is present which may indicate postoperative ileus.  However there does appear be more distention of small bowel loops and a partial small bowel obstruction cannot be excluded.  Some contrast is scattered throughout the bowel.   Arterial calcifications are noted.  IMPRESSION: Distention of both large and small bowel may indicate ileus, but a partial small bowel obstruction cannot be excluded.  Original Report Authenticated By: Juline Patch, M.D.   Dg Abd Portable 1v  04/28/2012  *RADIOLOGY REPORT*  Clinical Data: Abdominal pain.  Ileus.  Progressive leukocytosis.  PORTABLE ABDOMEN - 1 VIEW  Comparison: Radiographs dated 04/20/2012  Findings: NG tube tip is in the body of the stomach.  There are no dilated loops of bowel.  Double-lumen catheter is in the right femoral vein with the tips in the inferior vena cava.  Contrast is seen in the gallbladder.  There is also scattered high density material in the bowel.  IMPRESSION: No acute abnormalities.  Original Report Authenticated By: Gwynn Burly, M.D.   Dg Abd Portable 1v  04/16/2012  *RADIOLOGY REPORT*  Clinical Data: No bowel activity since exploratory laparoscopy 2 days ago question ileus, right lower quadrant pain  PORTABLE ABDOMEN - 1 VIEW  Comparison: 04/13/2012  Findings: Nasogastric tube in stomach. Retained contrast in colon. Surgical clips in pelvis bilaterally. Scattered atherosclerotic calcifications. No bowel dilatation, bowel wall thickening, or evidence of obstruction. Bowel staple lines noted in right mid abdomen.  IMPRESSION: Nonobstructive bowel gas pattern. Resolution of small bowel distention since prior exam.  Original Report Authenticated By: Lollie Marrow, M.D.   Ir Shuntogram/ Fistulagram Left Mod Sed  05/10/2012  *RADIOLOGY REPORT*  Clinical Data: Left leg swelling.  Graft revision 2 weeks ago.  AV SHUNTOGRAM  Procedure:  The left thigh was prepped and draped in a sterile fashion.  An 18 gauge Angiocath was inserted into the AV graft. Contrast was injected.  The Angiocath was removed.  Hemostasis was achieved with direct pressure. No complication.  Findings: The arterial anastomosis and graft are widely patent. There is moderate narrowing at the venous  anastomosis.  There is associated narrowing of the external iliac vein.  Central venous structures are otherwise patent  IMPRESSION: Venous anastomotic and external iliac vein narrowing.  Otherwise patent circuit.   Original Report Authenticated By: Donavan Burnet, M.D. ( 05/10/2012 13:05:15 )     Scheduled Meds:    . antiseptic oral rinse  15 mL Mouth Rinse q12n4p  . chlorhexidine  15 mL Mouth Rinse BID  . darbepoetin      . darbepoetin (ARANESP) injection - DIALYSIS  100 mcg Intravenous Q Sat-HD  . fentaNYL  37.5 mcg Transdermal Q72H  . fluconazole (DIFLUCAN) IV  400 mg Intravenous Q T,Th,Sa-HD  . heparin  40 Units/kg Dialysis Once in dialysis  . hydrocortisone sod succinate (SOLU-CORTEF) injection  25 mg Intravenous Daily  . insulin aspart  0-15 Units Subcutaneous Q8H  . lip balm   Topical BID  . metoprolol  5 mg Intravenous Q6H  . pantoprazole  40 mg Oral Q1200  . piperacillin-tazobactam (ZOSYN)  IV  2.25 g Intravenous Q8H  . sodium chloride  10-40 mL Intracatheter Q12H  . DISCONTD: darbepoetin (ARANESP) injection - DIALYSIS  200 mcg Intravenous Q Sat-HD   Continuous Infusions:    . fat emulsion 240 mL (05/21/12 1752)  . TPN (CLINIMIX) +/- additives 70 mL/hr at 05/21/12 1200  . TPN (CLINIMIX) +/- additives 70 mL/hr at 05/21/12 1751  . TPN (CLINIMIX) +/- additives      Principal Problem:  *SBO (small bowel obstruction) s/p EL/LOA and SBR x 2 Active Problems:  CKD (chronic kidney disease) stage V requiring chronic dialysis  Thrombocytopenia  Gout  HTN (hypertension)  Ileus following gastrointestinal surgery  Chronic use of steroids  Malnutrition, calorie  Leg graft occlusion  Acute respiratory failure with hypoxia  Sinus tachycardia  Abscess of abdominal cavity  Wound dehiscence  Bleeding  Anemia  Time spent:  30 minutes  Aadhya Bustamante, [facility]  [facility] Pager [phone]. If 8PM-8AM, please contact night-coverage at [email or web address], password  [facility] 05/22/2012, 12:59 PM  LOS: 50 days

## 2012-05-23 LAB — GLUCOSE, CAPILLARY: Glucose-Capillary: 99 mg/dL (ref 70–99)

## 2012-05-23 MED ORDER — ONDANSETRON HCL 4 MG/2ML IJ SOLN
4.0000 mg | Freq: Four times a day (QID) | INTRAMUSCULAR | Status: DC | PRN
  Administered 2012-05-26: 4 mg via INTRAVENOUS
  Filled 2012-05-23: qty 2

## 2012-05-23 MED ORDER — PROMETHAZINE HCL 25 MG/ML IJ SOLN
25.0000 mg | INTRAMUSCULAR | Status: DC | PRN
  Administered 2012-05-24: 25 mg via INTRAVENOUS
  Filled 2012-05-23: qty 1

## 2012-05-23 MED ORDER — INSULIN REGULAR HUMAN 100 UNIT/ML IJ SOLN
INTRAVENOUS | Status: AC
  Administered 2012-05-23: 18:00:00 via INTRAVENOUS
  Filled 2012-05-23: qty 2000

## 2012-05-23 MED ORDER — HEPARIN SODIUM (PORCINE) 1000 UNIT/ML DIALYSIS
20.0000 [IU]/kg | INTRAMUSCULAR | Status: DC | PRN
  Filled 2012-05-23: qty 1

## 2012-05-23 MED ORDER — ONDANSETRON HCL 4 MG PO TABS: 8.0000 mg | ORAL_TABLET | Freq: Four times a day (QID) | ORAL | Status: DC | PRN

## 2012-05-23 NOTE — Progress Notes (Signed)
General surgery attending note:  Patient is interviewed and examined by me this morning. I agree with the evaluation and treatment plan outlined by [name].  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 There is no drainage. The right lower quadrant pigtail catheter is draining minimal drainage.  Plan: We will need to reassess the adequacy of drainage from the right lower quadrant catheter, and assess the fistula as best as possible.  I do not think we can do a fistulogram since we cannot find the opening.  We will start with CT scan of abdomen with oral contrast to make sure there are no residual fluid collections.   Angelia Mould. Derrell Lolling, M.D., [facility] General and Minimally invasive Surgery Breast and Colorectal Surgery Office:   [phone] Pager:   [phone]

## 2012-05-23 NOTE — Progress Notes (Signed)
[facility] PROGRESS NOTE  [name] ZOX:[id number] DOB: Sep 01, 1961 DOA: 04/02/2012 PCP: Trevor Iha, MD   Assessment/Plan:  Principal Problem:  *SBO (small bowel obstruction) s/p EL/LOA and SBR x 2 Active Problems:  CKD (chronic kidney disease) stage V requiring chronic dialysis  Thrombocytopenia  Gout  HTN (hypertension)  Ileus following gastrointestinal surgery  Chronic use of steroids  Malnutrition, calorie  Leg graft occlusion  Acute respiratory failure with hypoxia  Sinus tachycardia  Abscess of abdominal cavity  Wound dehiscence  Bleeding  Anemia   SBO (small bowel obstruction) s/p exploratory laparotomy and lysis of adhesions and partial small bowel x 2 on 7/24.  Patient had subsequent anastomotic leak- omental patch of anastomosis on 8/2.  On 8/8, CT abdomen and pelvis demonstrated several fluid collections consistent with abscess, largest measuring 16.5 x 13.7 x 6.4 cm.  On 8/9, he underwent percutaneous drainage of largest fluid collection.  Repeat CT done for increasing WBC count on 8/13 revealed enlarging abscess and an enteric fistula with obstructed drainage tube. The drain was repositioned on 8/14.  Since that time, he has had an open abdominal wound which connects to an abscess cavity, which has been slowly closing.  General surgery has been following.  Per surgery, the right drain is likely to continue to drain fistula fluid and will require surgical correction.  The abdominal wound has been healing well and now that drainage is minimal, patient can undergo upper GI and repeat CT scan next week in preparation for starting enteric feeds. -  CT scan in AM.  Spoke with radiology per patient request and per radiology, okay to drink contrast over 4 hours instead of 2 hours.  Will start drinking at [facility] for CT scan at 7AM. -  Zofran increased and phenergan ordered in preparation for contrast/scan. -  Appreciate surgery recommendations -  Continue ice chips  and IV nutrition   Peritonitis secondary to anastomotic dehiscence/intra-abdominal abscess ID had previously been following and recommended zosyn and diflucan until 9/16 and then repeat CT scan prior to discontinuing antibiotics.  Since the patient has been improving with near closure of the abdominal wound and will be undergoing CT next week, will talk to ID about duration of antibiotics after scan complete as may be able to discontinue sooner.  Ileus, resolved.  Patient has excellent bowel sounds and is passing gas.  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  Patient should not bend the right hip per interventional radiology.    Chronic nonocclusive left lower extremity DVT.  Diagnosed via Doppler 04/30/2012 and follow up on 8/21 showed no evidence of DVT, superficial thrombosis or baker's cyst.  Resolved.  CKD (chronic kidney disease) stage V requiring chronic dialysis Dialysis per nephrology   HTN (hypertension), elevated today but has tendency to drop precipitously.  Has been persistently elevated for the last several days and was over 200 systolic briefly yesterday.   -  Increase IV metoprolol yesterday and BP better controlled  Anemia *Stable at 10.5. Due to acute blood loss and due to chronic disease - see above regarding wound bleeding  *Has received a total of 4 units of  packed red blood cells this admission  *Cont aranesp and ferric gluconate   Chronic use of steroids  (gout).  Patient had been on pred 5mg  daily. Weaned IV hydrocortisone.  Currently at 25 mg daily.    -  Wean to 20mg   daily today (equivalent to pred 5) and continue until tolerating PO and try to wean off if tolerated.   Leg graft occlusion  s/p thrombectomy 7/25 - unfortunately re clotted and undergone an additional thrombectomy procedure with revision of arterial and graft on 04/27/2012.  Was briefly dialyzed via HD cath in right groin but currently left femoral graft is being used again.  See discussion per vascular surgery concerning narrowing at the venous anastomosis  Severe protein calorie malnutrition *Patient cachectic at presentation  *Parenteral nutrition managed by surgery and pharmacy.  Patient is not receiving adequate calories because of fluid volume.  Anticipate nutritional status will improve once patient tolerating some enteric nutrition.     Disposition  *As long as the patient remains on TNA, he would likely require dialysis 4 x  week.  * Pursuing LTAC for this patient at [facility] and per case management, patient may be able to go to LTAC if HD bed available even with 4X/wk HD.  Spoke with patient and his sister who stated they strongly prefer not to go to [facility] as they had a brother who died there.  Advised sister to schedule appointment to discuss this preference with social work.  Code Status: Full Family Communication: Not applicable Disposition Plan:  Pending LTAC bed.   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 He relates one day prior to admission he started having abdominal pain which progressively got worse it was mainly periumbilical nothing made it better or worse had no radiation. But in the middle of the night he took some TUMS and the pain did not improve. After this he started vomiting about 3 or 4 times. So he decided to come here to the emergency room. Here in  the emergency room his pain was relieved by narcotics. A CT scan of the abdomen and pelvis was done that shows small bowel obstruction we were asked to mid and further evaluate.  Since his hospitalization the patient had exploratory laparotomy and lysis of adhesions as a result of a small bowel obstruction. He subsequently had an anastomotic leak with an abscess which is has necessitated an omental patching of the anastomosis and percutaneous drain placement. The patient had an elevation of his white blood cell count and CT of the abdomen revealed an enlarging abscess an enteric fistula was obstructed drainage tube necessitated repositioning of the percutaneous drain a percutaneous drain. Presently the patient has an open abdominal wound which is connected with the abscess cavity and is being managed by general surgery and wound ostomy care.   Consultants: General surgery Nephrology Infectious disease the  Procedures: 7/24 Ex-lap, lysis of adhesions, partial small bowel resection x2  7/25 Thrombectomy L femoral AV graft  7/26 Extubated, CVL is out  7/27 Transferred to telemetry under [facility]  8/2 Acute abdomen, dehiscence of anastomosis, SBO, back from OR intubated in ICU on PCCM service. Found to have moderate peritonitis infraumbilically  8/2 Extubated   Antibiotics: Zosyn 8/1 >>> (tentative stop date of 9/16 with CT scan at that time)  Micafungin 8/3 >>8/16  Invanz 7/24 periop  Fluconazole- 8/2 + 8/17 >>  (tentative stop date of 9/16 with CT scan at that time) Vancomycin 8/8 >>> 05/01/2012   HPI/Subjective: Patient feels extremely anxious about his CT scan tomorrow.  He is particularly worried that his fistula is going to "burst open" and he will be set back.  He is very concerned about the pressure in his abdomen of so much contrast over such a [name] amount of time.  Attempted to assure patient that he will not have a "burst" of his fistula and tried to allay fears, but he continues to be  very anxious.   Objective: Filed Vitals:   05/22/12 1230 05/22/12 2327 05/23/12 0518 05/23/12 0915  BP: 157/88 135/63 144/67 133/74  Pulse: 70 80 84 74  Temp: 97 F (36.1 C) 98.4 F (36.9 C) 98.3 F (36.8 C) 98.2 F (36.8 C)  TempSrc: Oral Oral Oral Oral  Resp: 19 17 17 16   Height:      Weight: 50.2 kg (110 lb 10.7 oz) 51.6 kg (113 lb 12.1 oz)    SpO2: 99% 97% 97% 97%   Weight change: 1.036 kg (2 lb 4.6 oz)  Intake/Output Summary (Last 24 hours) at 05/23/12 1353 Last data filed at 05/23/12 0900  Gross per 24 hour  Intake 2027.67 ml  Output      0 ml  Net 2027.67 ml    General:  Cachectic AAM HEENT:  MMM Heart: Regular rate and rhythm, without murmurs, rubs, gallops.  Lungs: Clear to auscultation Abdomen:  Soft, non-tender, active bowel sounds, nondistended.  Patient has dry dressing on abdomen that is clean and dry. Percutaneous drain draining serosanguinous material on right groin. Neuro: No focal neurological deficits noted . Musculoskeletal:  Decreased tone and bulk. Right groin vas-cath.  Data Reviewed: Basic Metabolic Panel:  Lab 05/22/12 1610 05/21/12 0631 05/20/12 0600 05/19/12 1000 05/18/12 0838 05/17/12 0800  NA 127* 133* 128* -- 134* 126*  K 3.2* 3.3* 3.1* -- 3.5 3.3*  CL 92* 96 93* -- 96 89*  CO2 20 24 22  -- 24 21  GLUCOSE 94 109* 115* -- 91 90  BUN 70* 40* 58* -- 47* 78*  CREATININE 5.91* 4.03* 5.28* -- 4.57* 5.86*  CALCIUM 9.7 9.4 9.7 -- 9.6 9.5  MG -- 1.5 1.5 -- 1.6 --  PHOS 2.5 2.2* 1.8* 1.9* 1.5* --   Liver Function Tests:  Lab 05/22/12 0816 05/20/12 0600 05/18/12 0838 05/17/12 0800  AST -- 37 37 32  ALT -- 32 28 26  ALKPHOS -- 156* 135* 134*  BILITOT -- 1.8* 1.9* 2.1*  PROT -- 5.2* 5.3* 5.0*  ALBUMIN 1.5* 1.5* 1.5* 1.4*   No results found for this basename: LIPASE:5,AMYLASE:5 in the last 168 hours No results found for this basename: AMMONIA:5 in the last 168 hours CBC:  Lab 05/22/12 0816 05/20/12 0600 05/17/12 0800  WBC 9.2 9.0 6.2    NEUTROABS -- -- 4.3  HGB 11.3* 10.7* 10.5*  HCT 33.3* 32.4* 31.2*  MCV 81.2 84.2 82.1  PLT 137* 132* 105*   Cardiac Enzymes: No results found for this basename: CKTOTAL:5,CKMB:5,CKMBINDEX:5,TROPONINI:5 in the last 168 hours BNP (last 3 results) No results found for this basename: PROBNP:3 in the last 8760 hours CBG:  Lab 05/23/12 0817 05/22/12 2325 05/22/12 1623 05/22/12 0800 05/21/12 2356  GLUCAP 99 106* 107* 106* 134*    No results found for this or any previous visit (from the past 240 hour(s)).   Studies:  Ct Angio Chest Pe W/cm &/or Wo Cm  04/25/2012  *RADIOLOGY REPORT*  Clinical Data: Chest pain and cough.  CT ANGIOGRAPHY CHEST  Technique:  Multidetector CT imaging of the chest using the standard protocol during bolus administration of intravenous contrast. Multiplanar reconstructed images including MIPs were obtained and reviewed to evaluate the vascular anatomy.  Contrast: OMNIPAQUE IOHEXOL 350 MG/ML SOLN  Comparison: None.  Findings: Technically adequate study with good opacification of the central and segmental pulmonary arteries.  No focal filling defects.  No evidence of significant pulmonary embolus.  Normal heart size.  Normal caliber thoracic aorta without dissection. Aortic and coronary artery calcifications.  Enteric tube with tip below the level of the stomach, not visualized.  Small amount of mucous in the trachea.  Airways generally appear patent.  There is a small left pleural effusion with basilar atelectasis or consolidation on the left.  Vague patchy infiltration in the left upper lung may represent edema or pneumonia.  No pneumothorax. There is pneumoperitoneum noted in the upper abdomen.  This likely results from recent surgery with noted laparotomy on 04/22/2012. Normal alignment of the thoracic spine.  IMPRESSION: No evidence of significant pulmonary embolus or aortic dissection. Small left pleural effusion with basilar atelectasis or consolidation.  Vague patchy  infiltration in the left upper lung. Abdominal pneumoperitoneum consistent with recent surgery.  Original Report Authenticated By: Marlon Pel, M.D.   Ct Guided Abscess Drain  04/30/2012  *RADIOLOGY REPORT*  Indication: Post surgery for small bowel obstruction, now with anastomotic leak and large intra-abdominal abscess.  CT GUIDED RIGHT LOWER QUADRANT ABDOMINAL DRAINAGE CATHETER PLACEMENT  Comparison: CT abdomen pelvis - 04/29/2012  Medications: Fentanyl 75 mcg IV; Versed 1.5 mg IV  Total Moderate Sedation time: 20 minutes  Contrast: None  Complications: None immediate  Technique / Findings:  Informed written consent was obtained from the patient after a discussion of the risks, benefits and alternatives to treatment. The patient was placed supine on the CT gantry and a pre procedural CT was performed re-demonstrating the known abscess/fluid collection within the right lower abdominal quadrant.  The procedure was planned.   A timeout was performed prior to the initiation of the procedure.  The skin overlying the right lower abdomen was prepped and draped in the usual sterile fashion.   The overlying soft tissues were anesthetized with 1% lidocaine with epinephrine.  An 18 gauge trocar needle was advanced in to the abscess/fluid collection and a Kamari Buch Amplatz super stiff wire was coiled within the abscess/fluid collection.   Appropriate positioning was confirmed with a limited CT scan.  The tract was serially dilated allowing placement of a 10 [place] all-purpose drainage catheter.  Appropriate positioning was confirmed with a limited postprocedural CT scan.  200 ml of bloody, slightly purulent fluid was aspirated.  The tube was connected to a drainage bag and sutured in place.  A dressing was placed.  The patient tolerated the procedure well without immediate post procedural complication.  Impression:  Successful CT guided placement of a 10 [place] all purpose drain catheter into the right lower abdomen with  aspiration of 200 mL of bloody, slightly purulent fluid.  Samples were sent to the laboratory as requested by the ordering clinical team.  Original Report Authenticated By: Waynard Reeds, M.D.   Ct Abdomen Pelvis W Contrast  05/04/2012  *RADIOLOGY REPORT*  Clinical Data: Status post surgery to treat the small bowel obstruction.  A right lower quadrant abscess was treated with percutaneous  drainage on 04/30/2012.  CT ABDOMEN AND PELVIS WITH CONTRAST  Technique:  Multidetector CT imaging of the abdomen and pelvis was performed following the standard protocol during bolus administration of intravenous contrast.  Contrast:  75 ml Omnipaque-300 IV  Comparison: 04/30/2012 and 04/29/2012  Findings: Visualized lung bases show a left basilar pleural effusion and consolidation/atelectasis of the posterior left lower lobe.  There is extravasation of oral contrast from the small bowel into a residual abscess cavity within the right lower quadrant of the abdomen.  The percutaneous drain is located in the posterior/dependent aspect of the cavity and shows some surrounding debris which may be acting to occlude/partially occlude the catheter.  The abscess cavity remains of significant size, measuring over 10 cm in greatest dimensions.  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  Consider repositioning/upsizing of the current percutaneous drain or placement of a second drain into the more anterior aspect of the residual collection.  No new abscess collections are identified. There is no evidence of gross free intraperitoneal air.  Solid organs of the abdomen are stable in appearance.  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 Indwelling percutaneous drain lies in the dependent portion of this collection which may contain debris obstructing the catheter. Given the large residual collection present, further drain manipulation including potential repositioning and upsizing of the current drain and also potentially placement of a second more anteriorly positioned drain may be helpful.  This will be communicated to the interventional Radiology service.  Original Report Authenticated By: Reola Calkins, M.D.   Ct Abdomen Pelvis W Contrast  04/29/2012  *RADIOLOGY REPORT*  Clinical Data: Post surgery for small bowel obstruction in an anastomotic leak.  Leukocytosis.  Dialysis patient.  CT ABDOMEN AND PELVIS WITH CONTRAST  Technique:  Multidetector CT imaging of the abdomen and pelvis was performed following the standard protocol during bolus administration of intravenous contrast.  Contrast: OMNIPAQUE IOHEXOL 300 MG/ML  SOLN  Comparison: 04/03/2012  Findings: New small left pleural effusion with basilar atelectasis or consolidation on the left.  Enteric tube with tip in the distal stomach.  The liver and spleen appear homogeneous without enlargement.  Portal and mesenteric vessels appear patent.  The gallbladder is moderately distended and is filled with increased density material suggesting vicarious contrast excretion. Pancreatic duct is diffusely dilated as seen previously.  No obstructing mass or stone is visualized.  Consider MRCP for further evaluation.  Extensive calcification of the abdominal aorta, iliac vessels, and splenic artery.  Calcification of the mesenteric artery with flow demonstrated.  Left adrenal gland nodule measuring 14 mm diameter.  This appears stable since the previous study but density measures are indeterminate.  No significant retroperitoneal lymphadenopathy.  Bilateral renal  atrophy with multiple bilateral renal cysts.  No hydronephrosis.  The stomach, small bowel, and colon are decompressed.  No residual small bowel obstruction. Interval postoperative changes with midline surgical defect along the anterior abdominal wall and skin clips in the left groin region.  There is interval development of a large gas and fluid collections in the right pelvis extending from  anteriorly to posteriorly and extending up into the right lower quadrant.  This collection measures up to about 16.5 x 13.7 x 6.4 cm.  The collection has a mildly thickened enhancing wall and air-fluid levels are present.  The appearance is consistent with loculated fluid or abscess.  There are additional loculated fluid collections in the anterior right lower quadrant measuring 3.3 x .9 centimeters and between the medial ascending colon and psoas muscle measuring 2.6 x 2.6 cm.  Sulci are consistent with abscesses.  Probably loculated fluid collection at the tip of the liver inferiorly. Infiltration into the mesenteric fat in the upper abdomen around the spleen and liver may represent edema.  No loculation is apparent.  Pelvis:  Pelvic transplant kidney with diffuse atrophy and prominent renal sinus fat.  No hydronephrosis.  Vascular calcification in the graft.  There  is a vascular graft extending from the common femoral vein inferiorly and from the common femoral artery laterally on the left.  Extensive calcification of the native femoral arteries bilaterally with suggestion of significant stenosis of the right common femoral artery.  Edema in the subcutaneous soft tissues particularly on the left.  Normal alignment of the lumbar vertebra.  IMPRESSION: Interval postoperative changes.  No residual bowel obstruction. Multiple loculated fluid collections including a very large right abdominal and pelvic collection consistent with abscesses. Vicarious contrast excretion into the gallbladder.  New left pleural effusion and basilar  atelectasis.  Additional incidental findings are stable since the previous study.  Results were telephoned to and Marchelle Folks, the patient's nurse on [facility]- 2600 at the time of dictation, 2129 hours on 04/29/2012.  Original Report Authenticated By: Marlon Pel, M.D.   Ir Catheter Tube Change  05/05/2012  *RADIOLOGY REPORT*  Clinical history:51 year old with multiple medical problems including a small bowel leak.  The patient has a right lower quadrant drain which is incompletely draining the large abscess collection.  PROCEDURE(S): EXCHANGE AND REPOSITIONING OF THE PERCUTANEOUS DRAINAGE CATHETER WITH FLUOROSCOPY  Physician: Rachelle Hora. Henn, MD  Medications:Versed 2 mg, Fentanyl 25 mcg. A radiology nurse monitored the patient for moderate sedation.  Moderate sedation time:22 minutes  Fluoroscopy time: 3.8 minutes  Procedure:Informed consent was obtained for a drain repositioning and exchange.  The patient was placed supine on the interventional table.  The existing catheter in the right lower quadrant was prepped and draped in a sterile fashion.  Maximal barrier sterile technique was utilized including caps, mask, sterile gowns, sterile gloves, sterile drape, hand hygiene and skin antiseptic.  Catheter was injected with contrast and the catheter was cut and removed over a Bentson wire.  A Kumpe catheter was advanced into the upper portion of the collection.  Position was confirmed with ultrasound. A 12-French biliary drain was advanced over a Bentson wire and into the superior aspect the collection.  Greater than 200 ml of dark brown fluid was removed.  Catheter sutured to the skin and attached to a gravity bag.  Findings:The old catheter was located within the inferior posterior aspect of the abscess collection.  Adjacent to the collection, there is high density material which is probably related to the small bowel fistula or leak.  Catheter and wire were advanced into the anterior and superior aspect of the  collection.  Biliary drain was also advanced into the superior aspect of the collection. Greater than 200 ml of dark brown fluid was removed.  Complications: None  Impression:Successful exchange and repositioning of the drainage catheter.  The large abscess was successfully decompressed  following placement of the new catheter.  Original Report Authenticated By: Richarda Overlie, M.D.   Ir Fluoro Guide Cv Line Left  04/23/2012  *RADIOLOGY REPORT*  Clinical Data/Indication: BOWEL OBSTRUCTION.  CENTRAL VENOUS ACCESS REQUESTED.  IR LEFT FLOURO GUIDE CV LINE  Fluoroscopy Time: 2.5 minutes.  Procedure: The procedure, risks, benefits, and alternatives were explained to the patient. Questions regarding the procedure were encouraged and answered. The patient understands and consents to the procedure.  The right neck was prepped with betadine in a sterile fashion, and a sterile drape was applied covering the operative field. A sterile gown and sterile gloves were used for the procedure.  Sonographic imaging over the right neck demonstrate a collateral venous structures only without a viable large central venous structure.  Under sonographic guidance, a micropuncture needle was inserted into one of the collateral vessels and removed over a 0018 wire.  The wire could not be advanced centrally.  Central venous access from the right neck was aborted.  Subsequently, under sonographic guidance, a micropuncture needle was inserted into the left internal jugular vein and removed over a 0.8 wire.  A 3-French dilator was inserted.  Central venography confirms central venous occlusion.  Findings: Central venous occlusion into the SVC is demonstrated. Center venous access was not placed.  Complications: None.  IMPRESSION: Central venous access was not placed.  Upper extremity and internal jugular vein center venous access is chronically occluded.  Original Report Authenticated By: Donavan Burnet, M.D.   Ir US Guide Vasc Access  Left  04/23/2012  Please refer to accession number [id number].  Original Report Authenticated By: Donavan Burnet, M.D.   Dg Chest Port 1 View  05/05/2012  *RADIOLOGY REPORT*  Clinical Data: Chest pain.  PORTABLE CHEST - 1 VIEW  Comparison: 04/28/2012  Findings: Nasogastric tube extends at least as   the   stomach, tip not seen.  Left lower lung consolidation / atelectasis has partially improved since the previous exam.  Right lung remains clear.  Probable small left pleural effusion.  Regional bones unremarkable.  IMPRESSION:  1.  Slight improvement in the left lower lung consolidation / atelectasis, with persistent small effusion.  Original Report Authenticated By: Osa Craver, M.D.   Dg Chest Port 1 View  04/28/2012  *RADIOLOGY REPORT*  Clinical Data: Progressive leukocytosis.  PORTABLE CHEST - 1 VIEW  Comparison: Chest x-ray dated 04/23/2012 and chest CT dated 04/25/2012  Findings: There is a persistent moderate left effusion with compressive atelectasis in the left lower lobe.  Heart size and vascularity are normal.  Right lung is clear.  NG tube tip is below the diaphragm.  IMPRESSION: Progressive left pleural effusion and left lower lobe atelectasis.  Original Report Authenticated By: Gwynn Burly, M.D.   Dg Chest Port 1 View  04/23/2012  *RADIOLOGY REPORT*  Clinical Data: Endotracheal tube placed  PORTABLE CHEST - 1 VIEW  Comparison: 04/15/2012  Findings: Endotracheal tube tip 7.2 cm proximal to the carina. Interval removal of the left IJ catheter.  Hyperinflated lungs. Mild retrocardiac opacity and left hemidiaphragm elevation.  No pneumothorax.  A enteric tube descends into the abdomen, tip not visualized.  There is nonspecific metallic density projecting over the left upper quadrant of the abdomen.  No acute osseous finding.  IMPRESSION: Endotracheal and enteric tubes as above.  Retrocardiac opacity; atelectasis, aspiration, or infiltrate.  Hyperinflation.  Original Report Authenticated  By: Waneta Martins, M.D.   Dg Abd 2 Views  04/20/2012  *RADIOLOGY REPORT*  Clinical Data: Postop abdominal distention, recent of abdominal surgery for small bowel obstruction and lysis of the patient with partial small bowel resection  ABDOMEN - 2 VIEW  Comparison: Portable abdomen film of 04/16/2012  Findings: Both large and small bowel gas is present which may indicate postoperative ileus.  However there does appear be more distention of small bowel loops and a partial small bowel obstruction cannot be excluded.  Some contrast is scattered throughout the bowel.  Arterial calcifications are noted.  IMPRESSION: Distention of both large and small bowel may indicate ileus, but a partial small bowel obstruction cannot be excluded.  Original Report Authenticated By: Juline Patch, M.D.   Dg Abd Portable 1v  04/28/2012  *RADIOLOGY REPORT*  Clinical Data: Abdominal pain.  Ileus.  Progressive leukocytosis.  PORTABLE ABDOMEN - 1 VIEW  Comparison: Radiographs dated 04/20/2012  Findings: NG tube tip is in the body of the stomach.  There are no dilated loops of bowel.  Double-lumen catheter is in the right femoral vein with the tips in the inferior vena cava.  Contrast is seen in the gallbladder.  There is also scattered high density material in the bowel.  IMPRESSION: No acute abnormalities.  Original Report Authenticated By: Gwynn Burly, M.D.   Dg Abd Portable 1v  04/16/2012  *RADIOLOGY REPORT*  Clinical Data: No bowel activity since exploratory laparoscopy 2 days ago question ileus, right lower quadrant pain  PORTABLE ABDOMEN - 1 VIEW  Comparison: 04/13/2012  Findings: Nasogastric tube in stomach. Retained contrast in colon. Surgical clips in pelvis bilaterally. Scattered atherosclerotic calcifications. No bowel dilatation, bowel wall thickening, or evidence of obstruction. Bowel staple lines noted in right mid abdomen.  IMPRESSION: Nonobstructive bowel gas pattern. Resolution of small bowel distention  since prior exam.  Original Report Authenticated By: Lollie Marrow, M.D.   Ir Shuntogram/ Fistulagram Left Mod Sed  05/10/2012  *RADIOLOGY REPORT*  Clinical Data: Left leg swelling.  Graft revision 2 weeks ago.  AV SHUNTOGRAM  Procedure:  The left thigh was prepped and draped in a sterile fashion.  An 18 gauge Angiocath was inserted into the AV graft. Contrast was injected.  The Angiocath was removed.  Hemostasis was achieved with direct pressure. No complication.  Findings: The arterial anastomosis and graft are widely patent. There is moderate narrowing at the venous anastomosis.  There is associated narrowing of the external iliac vein.  Central venous structures are otherwise patent  IMPRESSION: Venous anastomotic and external iliac vein narrowing.  Otherwise patent circuit.   Original Report Authenticated By: Donavan Burnet, M.D. ( 05/10/2012 13:05:15 )     Scheduled Meds:    . antiseptic oral rinse  15 mL Mouth Rinse q12n4p  . chlorhexidine  15 mL Mouth Rinse BID  . darbepoetin (ARANESP) injection - DIALYSIS  100 mcg Intravenous Q Sat-HD  . fentaNYL  37.5 mcg Transdermal Q72H  . fluconazole (DIFLUCAN) IV  400 mg Intravenous Q T,Th,Sa-HD  . heparin  40 Units/kg Dialysis Once in dialysis  . hydrocortisone sod succinate (SOLU-CORTEF) injection  20 mg Intravenous Daily  . insulin aspart  0-15 Units Subcutaneous Q8H  . lip balm   Topical BID  . metoprolol  10 mg Intravenous Q6H  . pantoprazole (PROTONIX) IV  40 mg Intravenous Daily  . piperacillin-tazobactam (ZOSYN)  IV  2.25 g Intravenous Q8H  . sodium chloride  10-40 mL Intracatheter Q12H   Continuous Infusions:    . fat emulsion 240 mL (05/21/12 1752)  . TPN (CLINIMIX) +/-  additives 70 mL/hr at 05/21/12 1751  . TPN (CLINIMIX) +/- additives 70 mL/hr at 05/22/12 1812  . TPN (CLINIMIX) +/- additives      Principal Problem:  *SBO (small bowel obstruction) s/p EL/LOA and SBR x 2 Active Problems:  CKD (chronic kidney disease) stage V  requiring chronic dialysis  Thrombocytopenia  Gout  HTN (hypertension)  Chronic use of steroids  Malnutrition, calorie  Leg graft occlusion  Acute respiratory failure with hypoxia  Sinus tachycardia  Abscess of abdominal cavity  Wound dehiscence  Bleeding  Anemia     Time spent:  30 minutes  Bairon Klemann  [facility] Pager [phone]. If 8PM-8AM, please contact night-coverage at [email or web address], password [name] 05/22/2012, 12:59 PM  LOS: 50 days

## 2012-05-23 NOTE — Progress Notes (Signed)
Patient ID: [name], male   DOB: 1961/09/14, 51 y.o.   MRN: [id number] 26 Days Post-Op  Subjective: Pt feels ok.  No complaints.  Objective: Vital signs in last 24 hours: Temp:  [97 F (36.1 C)-98.4 F (36.9 C)] 98.3 F (36.8 C) (09/01 0518) Pulse Rate:  [67-87] 84  (09/01 0518) Resp:  [17-19] 17  (09/01 0518) BP: (135-169)/(63-91) 144/67 mmHg (09/01 0518) SpO2:  [97 %-99 %] 97 % (09/01 0518) Weight:  [110 lb 10.7 oz (50.2 kg)-117 lb 4.6 oz (53.2 kg)] 113 lb 12.1 oz (51.6 kg) (08/31 2327) Last BM Date: 05/18/12  Intake/Output from previous day: 08/31 0701 - 09/01 0700 In: 2017.7 [IV Piggyback:300; TPN:1712.7] Out: 3052  Intake/Output this shift:    PE: Abd: soft, NT, ND, wound is stable.  No visible fistula opening that I can see.  No drainage.  Gravity bag with thin blood-tinged bilious output.  Lab Results:   [facility] 05/22/12 0816  WBC 9.2  HGB 11.3*  HCT 33.3*  PLT 137*   BMET  Basename 05/22/12 0816 05/21/12 0631  NA 127* 133*  K 3.2* 3.3*  CL 92* 96  CO2 20 24  GLUCOSE 94 109*  BUN 70* 40*  CREATININE 5.91* 4.03*  CALCIUM 9.7 9.4   PT/INR No results found for this basename: LABPROT:2,INR:2 in the last 72 hours CMP     Component Value Date/Time   NA 127* 05/22/2012 0816   K 3.2* 05/22/2012 0816   CL 92* 05/22/2012 0816   CO2 20 05/22/2012 0816   GLUCOSE 94 05/22/2012 0816   BUN 70* 05/22/2012 0816   CREATININE 5.91* 05/22/2012 0816   CALCIUM 9.7 05/22/2012 0816   PROT 5.2* 05/20/2012 0600   ALBUMIN 1.5* 05/22/2012 0816   AST 37 05/20/2012 0600   ALT 32 05/20/2012 0600   ALKPHOS 156* 05/20/2012 0600   BILITOT 1.8* 05/20/2012 0600   GFRNONAA 10* 05/22/2012 0816   GFRAA 11* 05/22/2012 0816   Lipase  No results found for this basename: lipase       Studies/Results: No results found.  Anti-infectives: Anti-infectives     Start     Dose/Rate Route Frequency Ordered Stop   05/22/12 1200   fluconazole (DIFLUCAN) IVPB 400 mg        400 mg 200  mL/hr over 60 Minutes Intravenous Every T-Th-Sa (Hemodialysis) 05/21/12 1108     05/20/12 2000   fluconazole (DIFLUCAN) IVPB 400 mg  Status:  Discontinued        400 mg 200 mL/hr over 60 Minutes Intravenous Once per day on Mon Tue Thu Sat 05/19/12 1417 05/21/12 1108   05/20/12 1700  piperacillin-tazobactam (ZOSYN) IVPB 2.25 g       2.25 g 100 mL/hr over 30 Minutes Intravenous Every 8 hours 05/20/12 1634     05/17/12 1600   fluconazole (DIFLUCAN) IVPB 200 mg        200 mg 100 mL/hr over 60 Minutes Intravenous  Once 05/17/12 1129 05/17/12 1940   05/08/12 1200   fluconazole (DIFLUCAN) IVPB 400 mg  Status:  Discontinued        400 mg 200 mL/hr over 60 Minutes Intravenous Every T-Th-Sa (Hemodialysis) 05/07/12 1752 05/19/12 1417   04/30/12 1400   vancomycin (VANCOCIN) 750 mg in sodium chloride 0.9 % 150 mL IVPB  Status:  Discontinued        750 mg 150 mL/hr over 60 Minutes Intravenous  Once 04/30/12 1212 04/30/12 1507   04/29/12 2330  vancomycin (VANCOCIN) 1,500 mg in sodium chloride 0.9 % 500 mL IVPB        1,500 mg 250 mL/hr over 120 Minutes Intravenous  Once 04/29/12 2259 04/30/12 0246   04/27/12 0000   ceFAZolin (ANCEF) IVPB 1 g/50 mL premix  Status:  Discontinued     Comments: Send with pt to OR      1 g 100 mL/hr over 30 Minutes Intravenous On call 04/26/12 0927 04/26/12 0928   04/24/12 1000   micafungin (MYCAMINE) 100 mg in sodium chloride 0.9 % 100 mL IVPB  Status:  Discontinued        100 mg 100 mL/hr over 1 Hours Intravenous Daily 04/24/12 0750 05/07/12 1713   04/23/12 1600   fluconazole (DIFLUCAN) IVPB 400 mg        400 mg 200 mL/hr over 60 Minutes Intravenous  Once 04/23/12 1428 04/23/12 1800   04/22/12 2200   piperacillin-tazobactam (ZOSYN) IVPB 3.375 g  Status:  Discontinued        3.375 g 100 mL/hr over 30 Minutes Intravenous 3 times per day 04/22/12 2120 04/22/12 2137   04/22/12 2200   piperacillin-tazobactam (ZOSYN) IVPB 2.25 g  Status:  Discontinued         2.25 g 100 mL/hr over 30 Minutes Intravenous 3 times per day 04/22/12 2140 05/20/12 0748   04/22/12 2157   gentamicin (GARAMYCIN) injection  Status:  Discontinued          As needed 04/22/12 2158 04/22/12 2335   04/22/12 2156   clindamycin (CLEOCIN) injection  Status:  Discontinued          As needed 04/22/12 2157 04/22/12 2335   04/22/12 2145  piperacillin-tazobactam (ZOSYN) IVPB 3.375 g       3.375 g 12.5 mL/hr over 240 Minutes Intravenous  Once 04/22/12 2141 04/23/12 0145   04/22/12 2130   gentamicin (GARAMYCIN) 240 mg, clindamycin (CLEOCIN) 900 mg in sodium chloride irrigation 0.9 % 1,000 mL irrigation  Status:  Discontinued         Irrigation  Once 04/22/12 2119 04/23/12 0020   04/14/12 0830   ertapenem (INVANZ) 1 g in sodium chloride 0.9 % 50 mL IVPB        1 g 100 mL/hr over 30 Minutes Intravenous To Surgery 04/14/12 0816 04/14/12 0834   04/13/12 1359   ertapenem (INVANZ) 1 g in sodium chloride 0.9 % 50 mL IVPB  Status:  Discontinued        1 g 100 mL/hr over 30 Minutes Intravenous 60 min pre-op 04/13/12 1359 04/14/12 0816           Assessment/Plan  1.  Patient Active Problem List  Diagnosis  . SBO (small bowel obstruction) s/p EL/LOA and SBR x 2  . CKD (chronic kidney disease) stage V requiring chronic dialysis  . Thrombocytopenia  . Gout  . HTN (hypertension)  . Chronic use of steroids  . Malnutrition, calorie  . Leg graft occlusion  . Acute respiratory failure with hypoxia  . Sinus tachycardia  . Abscess of abdominal cavity  . Wound dehiscence  . Bleeding  . Anemia   Plan: 1. I am unable to locate a fistula opening in order to do a fistulagram through.  I will d/w MDs to determine what type of test then needs to be done, or to see if we give him clears and see if he drains. 2. Cont gravity bag drain secondary to controlled SB leak.   LOS: 51 days  Leticia Mcdiarmid E 05/23/2012

## 2012-05-23 NOTE — Progress Notes (Addendum)
[facility] Progress Note  Subjective: No new issues. Spirits ok.  Objective Filed Vitals:   05/22/12 1200 05/22/12 1230 05/22/12 2327 05/23/12 0518  BP: 156/82 157/88 135/63 144/67  Pulse: 69 70 80 84  Temp:  97 F (36.1 C) 98.4 F (36.9 C) 98.3 F (36.8 C)  TempSrc:  Oral Oral Oral  Resp:  19 17 17   Height:      Weight:  50.2 kg (110 lb 10.7 oz) 51.6 kg (113 lb 12.1 oz)   SpO2:  99% 97% 97%   Physical Exam General: thin, comfortable Heart: RRR Lungs: no wheezes or rales Abdomen: thin, dressing in tact; right groin drain with bloody dark drainage. Extremities:no LE edema, marked muscle wasting Dialysis Access: left thigh graft patent; right groin triple lumen catheter  Dialysis Orders: Center: [facility] on TTS.  EDW 52.5 kg HD Bath 2K/2Ca Time 3.5 hrs Heparin 5000 U. Access AVG @ left thigh BFR 400 DFR 800 Zemplar 0 mcg IV/HD Epogen 0 Units IV/HD Venofer 50/week; last iPTH 314, but product high and no zemplar   Assessment/Plan:  1. 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 ESRD - bp ^, vol ^, continue 4x/wk hd on MTTS schedule. Hd tomorrow. High K bath. Give tight hep as pt clotted system and HD d/c'd 30 minutes early on Sat.  3. Anemia - Hgb 11.3 trending up nicely hgb no iron; will  Aranesp decreased 50% to 100 mcq  q Saturday 4. 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 HTN/volume - UF 3-4 kg each HD as tol.  IV metoprolol 5 mg q 6 hours;        6,   Malnutrition = On TNA Alb 1.5, but prealbumin 23.4 8/26        7. Gout - stable no pain on chronic pred, tapering per primary  Sheffield Slider, PA-C [facility] Beeper [phone]  05/23/2012,9:15 AM  LOS: 51 days   Patient seen and examined and agree with assessment and plan as above with additions as indicated.  Vinson Moselle   MD [facility] [phone] pgr    [phone] cell 05/23/2012, 2:50 PM      Additional Objective Labs: Basic Metabolic Panel:  Lab 05/22/12 2130 05/21/12 0631 05/20/12 0600  NA 127* 133* 128*  K 3.2* 3.3* 3.1*  CL 92* 96 93*  CO2 20 24 22   GLUCOSE 94 109* 115*  BUN 70* 40* 58*  CREATININE 5.91* 4.03* 5.28*  CALCIUM 9.7 9.4 9.7  ALB -- -- --  PHOS 2.5 2.2* 1.8*   Liver Function Tests:  Lab 05/22/12 0816 05/20/12 0600 05/18/12 0838 05/17/12 0800  AST -- 37 37 32  ALT -- 32 28 26  ALKPHOS -- 156* 135* 134*  BILITOT -- 1.8* 1.9* 2.1*  PROT -- 5.2* 5.3* 5.0*  ALBUMIN 1.5* 1.5* 1.5* --  CBC:  Lab 05/22/12 0816 05/20/12 0600 05/17/12 0800  WBC 9.2 9.0 6.2  NEUTROABS -- -- 4.3  HGB 11.3* 10.7* 10.5*  HCT 33.3* 32.4* 31.2*  MCV 81.2 84.2 82.1  PLT 137* 132* 105*  CBG:  Lab 05/23/12 0817 05/22/12 2325 05/22/12 1623 05/22/12 0800 05/21/12 2356  GLUCAP 99 106* 107* 106* 134*  Medications:    . fat emulsion 240 mL (05/21/12 1752)  . TPN (CLINIMIX) +/- additives  70 mL/hr at 05/21/12 1751  . TPN (CLINIMIX) +/- additives 70 mL/hr at 05/22/12 1812  . TPN (CLINIMIX) +/- additives        . antiseptic oral rinse  15 mL Mouth Rinse q12n4p  . chlorhexidine  15 mL Mouth Rinse BID  . darbepoetin (ARANESP) injection - DIALYSIS  100 mcg Intravenous Q Sat-HD  . fentaNYL  37.5 mcg Transdermal Q72H  . fluconazole (DIFLUCAN) IV  400 mg Intravenous Q T,Th,Sa-HD  . heparin  40 Units/kg Dialysis Once in dialysis  . hydrocortisone sod succinate (SOLU-CORTEF) injection  20 mg Intravenous Daily  . insulin aspart  0-15 Units Subcutaneous Q8H  . lip balm   Topical BID  . metoprolol  10 mg Intravenous Q6H  . pantoprazole (PROTONIX) IV  40 mg Intravenous Daily  . piperacillin-tazobactam (ZOSYN)  IV  2.25 g Intravenous Q8H  . sodium chloride  10-40 mL Intracatheter Q12H  . DISCONTD: darbepoetin (ARANESP) injection - DIALYSIS  200 mcg Intravenous Q Sat-HD  . DISCONTD:  hydrocortisone sod succinate (SOLU-CORTEF) injection  25 mg Intravenous Daily  . DISCONTD: metoprolol  5 mg Intravenous Q6H  . DISCONTD: pantoprazole  40 mg Oral Q1200

## 2012-05-23 NOTE — Progress Notes (Signed)
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 Allopurinol     REACTION: decreased platelets  . Aspirin     REACTION: unspecified    Patient Measurements: Height: 5\' 11"  (180.3 cm) Weight: 113 lb 12.1 oz (51.6 kg) IBW/kg (Calculated) : 75.3    Vital Signs: Temp: 98.3 F (36.8 C) (09/01 0518) Temp src: Oral (09/01 0518) BP: 144/67 mmHg (09/01 0518) Pulse Rate: 84  (09/01 0518) Intake/Output from previous day: 08/31 0701 - 09/01 0700 In: 2017.7 [IV Piggyback:300; TPN:1712.7] Out: 3052  Intake/Output from this shift:    Labs:  [facility] 05/22/12 0816  WBC 9.2  HGB 11.3*  HCT 33.3*  PLT 137*  APTT --  INR --     Basename 05/22/12 0816 05/21/12 0631  NA 127* 133*  K 3.2* 3.3*  CL 92* 96  CO2 20 24  GLUCOSE 94 109*  BUN 70* 40*  CREATININE 5.91* 4.03*  LABCREA -- --  CREAT24HRUR -- --  CALCIUM 9.7 9.4  MG -- 1.5  PHOS 2.5 2.2*  PROT -- --  ALBUMIN 1.5* --  AST -- --  ALT -- --  ALKPHOS -- --  BILITOT -- --  BILIDIR -- --  IBILI -- --  PREALBUMIN -- --  TRIG -- --  CHOLHDL -- --  CHOL -- --   Estimated Creatinine Clearance: 10.7 ml/min (by C-G formula based on Cr of 5.91).    Basename 05/22/12 2325 05/22/12 1623 05/22/12 0800  GLUCAP 106* 107* 106*   Insulin Requirements in the past 24 hours:  0 units Novolog SSI and 15 units regular insulin in TNA  Nutritional Goals:  1700-1950 kCal, 80-100 grams of protein per day   Current Nutrition:  Clinimix 5/15 at 70 ml/hr with 20% lipids at 10 ml/hr MWF provides an average of 1398 kcal and 84g protein per day  Admit: Admitted 7/13 with abd pain, weight loss, SBO. S/p LOA, SBR 7/24 and repeat LOA, SBR with anasomotic leak repair 8/1, thrombectomy with AVG revision 7/25 and repeat thrombectomy 8/6. 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  GI: Drain o/p down to 5 ml yesterday. Tolerating TPN. Pt allowed to  have ice chips and sips of watert. Pt's wt appears to be relatively stable. Noted CCS plan to do study Mon/Tues to see if EC fistula healed and if healed, plan to start trial enteral feedings.  Endo: CBGs well controlled. Solu-Cortef daily.  Lytes: K 3.2, Phos 2.5. Hypercalcemia continues (corrected Ca 11.7, Ca x PO4 remains < 55). Renal MD replacing lytes. Renal: ESRD with HD typically TTS. HD is currently being administered prn d/t volume issues. Currently 4x/week HD while pt requiring TPN - Noted plan to try and cut back to T/T/S. On Aranesp qSat.   Cards: BP variable - currently bit high. HR stable (continues with hypotension during HD). On scheduled lopressor and prn hydralazine.  Hepatobil: LFTs WNL 8/29 except alk phos/Tbili elevated. Prealbumin remains at goal. Trig WNL.  Neuro: GCS 15, no c/o pain - on fentanyl patch, prn pain meds.  ID: Fluconazole + Zosyn for peritonitis/abd abscess -- ID rec-cont current regimen for another mos with repeat CT scan to verify abscess smaller. With fistulas present, per ID may need indefinite abx with drains in place. Afeb, WBC wnl. Abd abscess culture: candida tropicalis.  Zosyn 8.1>>  Diflucan 8.16>> (s/p 14 d mycamine) Best Practices: Mouthcare, PPI po  Plan:  1. Continue Clinimix 5/15 (no lytes) @ 70 ml/hr to avoid exacerbating volume status. MD/RD aware TPN not meeting kcal goals with Clinimix alone. Will need to consider increasing TPN rate if pt not able to start trial of enteral feeding this week. Pt appears to be maintaining weight and prealbumin on current regimen. 2. Will defer electrolyte replacement to renal MD 3. Provide available trace elements, MVI and lipids @10cc /hr on MWF only d/t national backorder  3. F/u TPN labs in a.m.  Christoper Fabian, PharmD, BCPS Clinical pharmacist, pager [phone]  05/23/2012 8:20 AM

## 2012-05-24 ENCOUNTER — Inpatient Hospital Stay (HOSPITAL_COMMUNITY): Payer: Medicare Other

## 2012-05-24 LAB — PHOSPHORUS: Phosphorus: 2.5 mg/dL (ref 2.3–4.6)

## 2012-05-24 LAB — COMPREHENSIVE METABOLIC PANEL
Albumin: 1.6 g/dL — ABNORMAL LOW (ref 3.5–5.2)
Alkaline Phosphatase: 159 U/L — ABNORMAL HIGH (ref 39–117)
BUN: 74 mg/dL — ABNORMAL HIGH (ref 6–23)
Chloride: 91 mEq/L — ABNORMAL LOW (ref 96–112)
Creatinine, Ser: 6.32 mg/dL — ABNORMAL HIGH (ref 0.50–1.35)
GFR calc Af Amer: 11 mL/min — ABNORMAL LOW (ref >90)
Glucose, Bld: 105 mg/dL — ABNORMAL HIGH (ref 70–99)
Total Bilirubin: 1.9 mg/dL — ABNORMAL HIGH (ref 0.3–1.2)
Total Protein: 5.3 g/dL — ABNORMAL LOW (ref 6.0–8.3)

## 2012-05-24 LAB — CBC WITH DIFFERENTIAL/PLATELET
Eosinophils Absolute: 0.1 10*3/uL (ref 0.0–0.7)
Eosinophils Relative: 2 % (ref 0–5)
HCT: 35.3 % — ABNORMAL LOW (ref 39.0–52.0)
Lymphocytes Relative: 19 % (ref 12–46)
Lymphs Abs: 1.4 10*3/uL (ref 0.7–4.0)
MCH: 27.9 pg (ref 26.0–34.0)
MCV: 82.7 fL (ref 78.0–100.0)
Monocytes Absolute: 1 10*3/uL (ref 0.1–1.0)
Monocytes Relative: 13 % — ABNORMAL HIGH (ref 3–12)
Platelets: 117 10*3/uL — ABNORMAL LOW (ref 150–400)
RBC: 4.27 MIL/uL (ref 4.22–5.81)
WBC: 7.4 10*3/uL (ref 4.0–10.5)

## 2012-05-24 LAB — BASIC METABOLIC PANEL
BUN: 77 mg/dL — ABNORMAL HIGH (ref 6–23)
CO2: 19 mEq/L (ref 19–32)
Calcium: 9.7 mg/dL (ref 8.4–10.5)
Creatinine, Ser: 6.41 mg/dL — ABNORMAL HIGH (ref 0.50–1.35)
GFR calc non Af Amer: 9 mL/min — ABNORMAL LOW (ref >90)
Glucose, Bld: 122 mg/dL — ABNORMAL HIGH (ref 70–99)

## 2012-05-24 LAB — GLUCOSE, CAPILLARY
Glucose-Capillary: 118 mg/dL — ABNORMAL HIGH (ref 70–99)
Glucose-Capillary: 131 mg/dL — ABNORMAL HIGH (ref 70–99)
Glucose-Capillary: 135 mg/dL — ABNORMAL HIGH (ref 70–99)

## 2012-05-24 LAB — MAGNESIUM: Magnesium: 1.5 mg/dL (ref 1.5–2.5)

## 2012-05-24 LAB — TRIGLYCERIDES: Triglycerides: 128 mg/dL (ref <150)

## 2012-05-24 MED ORDER — ZINC TRACE METAL 1 MG/ML IV SOLN
INTRAVENOUS | Status: AC
  Administered 2012-05-24: 18:00:00 via INTRAVENOUS
  Filled 2012-05-24: qty 2000

## 2012-05-24 MED ORDER — MORPHINE SULFATE 2 MG/ML IJ SOLN
INTRAMUSCULAR | Status: AC
  Administered 2012-05-24: 2 mg via INTRAVENOUS
  Filled 2012-05-24: qty 1

## 2012-05-24 MED ORDER — FAT EMULSION 20 % IV EMUL
240.0000 mL | INTRAVENOUS | Status: AC
  Administered 2012-05-24: 240 mL via INTRAVENOUS
  Filled 2012-05-24: qty 250

## 2012-05-24 NOTE — Progress Notes (Signed)
General surgery attending note:  Patient was personally interviewed and examined by me. I agree with the evaluation and treatment plan outlined by [name].  Abdomen is soft. Drainage from RLQ drain is port wine in color, not really enteric in nature. Midline EC fistula appears to have dried up.  For CT scan abdomen today to make sure that RLQ cavity is completely collapsed.   [name]. Derrell Lolling, M.D., [facility] General and Minimally invasive Surgery Breast and Colorectal Surgery Office:   [phone] Pager:   [phone]

## 2012-05-24 NOTE — Progress Notes (Signed)
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 Allopurinol     REACTION: decreased platelets  . Aspirin     REACTION: unspecified    Patient Measurements: Height: 5\' 11"  (180.3 cm) Weight: 118 lb 13.3 oz (53.9 kg) IBW/kg (Calculated) : 75.3    Vital Signs: Temp: 97.1 F (36.2 C) (09/02 0741) Temp src: Oral (09/02 0741) BP: 113/67 mmHg (09/02 1030) Pulse Rate: 89  (09/02 1030) Intake/Output from previous day: 09/01 0701 - 09/02 0700 In: 1876.2 [IV Piggyback:150; TPN:1716.2] Out: 50 [Urine:50] Intake/Output from this shift:    Labs:  [facility] 05/24/12 0530 05/22/12 0816  WBC 7.4 9.2  HGB 11.9* 11.3*  HCT 35.3* 33.3*  PLT 117* 137*  APTT -- --  INR -- --     Basename 05/24/12 0757 05/24/12 0530 05/22/12 0816  NA 127* 128* 127*  K 3.5 3.6 3.2*  CL 91* 91* 92*  CO2 19 20 20   GLUCOSE 122* 105* 94  BUN 77* 74* 70*  CREATININE 6.41* 6.32* 5.91*  LABCREA -- -- --  CREAT24HRUR -- -- --  CALCIUM 9.7 10.0 9.7  MG -- 1.5 --  PHOS -- 2.5 2.5  PROT -- 5.3* --  ALBUMIN -- 1.6* 1.5*  AST -- 28 --  ALT -- 30 --  ALKPHOS -- 159* --  BILITOT -- 1.9* --  BILIDIR -- -- --  IBILI -- -- --  PREALBUMIN -- -- --  TRIG -- 128 --  CHOLHDL -- -- --  CHOL -- 158 --   Estimated Creatinine Clearance: 10.3 ml/min (by C-G formula based on Cr of 6.41).    Basename 05/24/12 0002 05/23/12 1617 05/23/12 0817  GLUCAP 138* 152* 99   Insulin Requirements in the past 24 hours:  5 units Novolog SSI and 15 units regular insulin in TNA (CBGs 99-152)  Nutritional Goals:  1700-1950 kCal, 80-100 grams of protein per day   Current Nutrition:  Clinimix 5/15 at 70 ml/hr with 20% lipids at 10 ml/hr MWF provides an average of 1398 kcal and 84g protein per day  Admit: Admitted 7/13 with abd pain, weight loss, SBO. S/p LOA, SBR 7/24 and repeat LOA, SBR with anasomotic leak repair 8/1, thrombectomy with AVG  revision 7/25 and repeat thrombectomy 8/6. 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  GI: Tolerating TPN. Pt allowed to have ice chips and sips of watert. Pt's wt appears to be relatively stable.Plan to start trial enteral feedings if fistula healed but cannot find open fistula to do fistulogram. Gravity drain bag due to controlled SB leak with thin blood-tinged bilious output.   Endo: CBGs well controlled. CBGs 99-152 on SSI.  Lytes: Na low at 127. K up to 3.5. Hypercalcemia continues (corrected Ca 11.7, Ca x PO4 remains < 55). Renal MD replacing lytes.  Renal: ESRD with HD typically TTS. HD is currently being administered prn d/t volume issues. Currently 4x/week HD MTTS while pt requiring TPN - Noted plan to try and cut back to T/T/S. On Aranesp qSat.    Cards: BP variable. (continues with hypotension during HD). On scheduled lopressor and prn hydralazine.   Hepatobil: LFTs WNL 8/29 except alk phos/Tbili elevated. Prealbumin remains at goal. Trig WNL 128  Neuro: GCS 15, no c/o pain - on fentanyl patch, prn pain meds.   ID: Fluconazole + Zosyn for peritonitis/abd abscess -- ID rec-cont current regimen for another mos with repeat CT scan  to verify abscess smaller. With fistulas present, per ID may need indefinite abx with drains in place. Afeb, WBC 7.4. Abd abscess culture: candida tropicalis.   Zosyn 8.1>>  Diflucan 8.16>> (s/p 14 d mycamine)  Heme/Onc: Hgb up to 11.9 on Aranesp q Saturday.  Best Practices: Mouthcare, PPI po  Plan:  1. Continue Clinimix 5/15 (no lytes) @ 70 ml/hr to avoid exacerbating volume status. MD/RD aware TPN not meeting kcal goals with Clinimix alone. Will need to consider increasing TPN rate if pt not able to start trial of enteral feeding this week. Pt appears to be maintaining weight and prealbumin on current regimen. 2. Will defer electrolyte replacement to renal MD 3. Provide available trace elements, MVI and lipids @10cc /hr on MWF only d/t  national backorder  4. F/u prealbumin in am. 5. Will get a CT today to eval SB leak and abscess cavity   Demetri Kerman S. Merilynn Finland, PharmD, [facility] Clinical Staff Pharmacist Pager [phone]   05/24/2012 10:56 AM

## 2012-05-24 NOTE — Consult Note (Signed)
Wound care follow-up:  Abd wound beefy red with small tan drainage, no odor.  Upper wound pink dry scar tissue.  Continue Aquacel dressing 3X week to absorb drainage and promote healing.   Cammie Mcgee, RN, MSN, [employer]  [phone]

## 2012-05-24 NOTE — Progress Notes (Signed)
[facility] PROGRESS NOTE  [name] WGN:[id number] DOB: September 07, 1961 DOA: 04/02/2012 PCP: Trevor Iha, MD   Assessment/Plan:  Principal Problem:  *SBO (small bowel obstruction) s/p EL/LOA and SBR x 2 Active Problems:  CKD (chronic kidney disease) stage V requiring chronic dialysis  Thrombocytopenia  Gout  HTN (hypertension)  Ileus following gastrointestinal surgery  Chronic use of steroids  Malnutrition, calorie  Leg graft occlusion  Acute respiratory failure with hypoxia  Sinus tachycardia  Abscess of abdominal cavity  Wound dehiscence  Bleeding  Anemia   SBO (small bowel obstruction) s/p exploratory laparotomy and lysis of adhesions and partial small bowel x 2 on 7/24.  Patient had subsequent anastomotic leak- omental patch of anastomosis on 8/2.  On 8/8, CT abdomen and pelvis demonstrated several fluid collections consistent with abscess, largest measuring 16.5 x 13.7 x 6.4 cm.  On 8/9, he underwent percutaneous drainage of largest fluid collection.  Repeat CT done for increasing WBC count on 8/13 revealed enlarging abscess and an enteric fistula with obstructed drainage tube. The drain was repositioned on 8/14.  Since that time, he has had an open abdominal wound which connects to an abscess cavity, which has been slowly closing.  General surgery has been following.  Per surgery, the right drain is likely to continue to drain fistula fluid and will require surgical correction.  The abdominal wound has been healing well and now that drainage is minimal.   -  Patient attempted to drink contrast and tolerated only a few ounces when he felt a pressure in his stomach, mild nausea without vomiting, and the sensation that he needed to urinate and defecate.  CT cancelled for today and [name] notified from Gen Surg.. -  Zofran and phenergan still ordered -  Appreciate surgery recommendations -  Continue ice chips and IV nutrition   Peritonitis secondary to anastomotic  dehiscence/intra-abdominal abscess ID had previously been following and recommended zosyn and diflucan until 9/16 and then repeat CT scan prior to discontinuing antibiotics.  Since the patient has been improving with near closure of the abdominal wound and will be undergoing CT next week, will talk to ID about duration of antibiotics after scan complete as may be able to discontinue sooner.  Ileus, resolved.  Patient has excellent bowel sounds and is passing gas.  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  Patient should not bend the right hip per interventional radiology.    Chronic nonocclusive left lower extremity DVT.  Diagnosed via Doppler 04/30/2012 and follow up on 8/21 showed no evidence of DVT, superficial thrombosis or baker's cyst.  Resolved.  CKD (chronic kidney disease) stage V requiring chronic dialysis Dialysis per nephrology   HTN (hypertension), elevated today but has tendency to drop precipitously.  Has been persistently elevated for the last several days and was over 200 systolic briefly yesterday.   -  Increase IV metoprolol yesterday and BP better controlled  Anemia *Stable at 10.5. Due to acute blood loss and due to chronic disease - see above regarding wound bleeding  *Has received a total of 4 units of packed red blood cells this admission  *Cont aranesp and  ferric gluconate   Chronic use of steroids  (gout).  Patient had been on pred 5mg  daily. Weaned IV hydrocortisone.  Currently at 25 mg daily.    -  Wean to 20mg  daily today (equivalent to pred 5) and continue until  tolerating PO and try to wean off if tolerated.   Leg graft occlusion  s/p thrombectomy 7/25 - unfortunately re clotted and undergone an additional thrombectomy procedure with revision of arterial and graft on 04/27/2012.  Was briefly dialyzed via HD cath in right groin but currently left femoral graft is being used again.  See discussion per vascular surgery concerning narrowing at the venous anastomosis  Severe protein calorie malnutrition *Patient cachectic at presentation  *Parenteral nutrition managed by surgery and pharmacy.  Patient is not receiving adequate calories because of fluid volume.  Anticipate nutritional status will improve once patient tolerating some enteric nutrition.     Disposition  *As long as the patient remains on TNA, he would likely require dialysis 4 x  week.  * Pursuing LTAC for this patient at [facility] and per case management, patient may be able to go to LTAC if HD bed available even with 4X/wk HD.  Spoke with patient and his sister who stated they strongly prefer not to go to [facility] as they had a brother who died there.  Advised sister to schedule appointment to discuss this preference with social work.  Code Status: Full Family Communication: Not applicable Disposition Plan:  Pending LTAC bed.   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 He relates one day prior to admission he started having abdominal pain which progressively got worse it was mainly periumbilical nothing made it better or worse had no radiation. But in the middle of the night he took some TUMS and the pain did not improve. After this he started vomiting about 3 or 4 times. So he decided to come here to the emergency room. Here in the emergency room his pain was relieved by  narcotics. A CT scan of the abdomen and pelvis was done that shows small bowel obstruction we were asked to mid and further evaluate.  Since his hospitalization the patient had exploratory laparotomy and lysis of adhesions as a result of a small bowel obstruction. He subsequently had an anastomotic leak with an abscess which is has necessitated an omental patching of the anastomosis and percutaneous drain placement. The patient had an elevation of his white blood cell count and CT of the abdomen revealed an enlarging abscess an enteric fistula was obstructed drainage tube necessitated repositioning of the percutaneous drain a percutaneous drain. Presently the patient has an open abdominal wound which is connected with the abscess cavity and is being managed by general surgery and wound ostomy care.   Consultants: General surgery Nephrology Infectious disease the  Procedures: 7/24 Ex-lap, lysis of adhesions, partial small bowel resection x2  7/25 Thrombectomy L femoral AV graft  7/26 Extubated, CVL is out  7/27 Transferred to telemetry under [facility]  8/2 Acute abdomen, dehiscence of anastomosis, SBO, back from OR intubated in ICU on PCCM service. Found to have moderate peritonitis infraumbilically  8/2 Extubated   Antibiotics: Zosyn 8/1 >>> (tentative stop date of 9/16 with CT scan at that time) Micafungin 8/3 >>8/16  Invanz 7/24 periop  Fluconazole- 8/2 +  8/17 >>  (tentative stop date of 9/16 with CT scan at that time) Vancomycin 8/8 >>> 05/01/2012   HPI/Subjective:  Patient states that dialysis went well, but he was not able to tolerate the contrast for his CT scan.  He felt pressure in his stomach with nausea and the sensation he needed to defecate and urinate.  He drank about 2 ounces of contrast and could not tolerate more.  CT scan cancelled.  He currently feels recovered.  Objective: Filed Vitals:   05/24/12 1030 05/24/12 1100 05/24/12 1131 05/24/12 1134  BP: 113/67 107/59 98/68  125/67  Pulse: 89 88 94 84  Temp:    98.6 F (37 C)  TempSrc:    Oral  Resp:    18  Height:      Weight:    49.7 kg (109 lb 9.1 oz)  SpO2:    97%   Weight change: -2.3 kg (-5 lb 1.1 oz)  Intake/Output Summary (Last 24 hours) at 05/24/12 1248 Last data filed at 05/24/12 1134  Gross per 24 hour  Intake 1866.16 ml  Output   3550 ml  Net -1683.84 ml    General:  Cachectic AAM, lying in bed, frustrated. HEENT:  MMM Heart: Regular rate and rhythm, without murmurs, rubs, gallops.  Lungs: Clear to auscultation Abdomen:  Stable, soft, non-tender, active bowel sounds, nondistended.  Patient has dry dressing on abdomen that is clean and dry. Percutaneous drain draining serosanguinous material on right groin. Musculoskeletal:  Decreased tone and bulk. Right groin vas-cath.  Data Reviewed: Basic Metabolic Panel:  Lab 05/24/12 4098 05/24/12 0530 05/22/12 0816 05/21/12 0631 05/20/12 0600 05/19/12 1000 05/18/12 0838  NA 127* 128* 127* 133* 128* -- --  K 3.5 3.6 3.2* 3.3* 3.1* -- --  CL 91* 91* 92* 96 93* -- --  CO2 19 20 20 24 22  -- --  GLUCOSE 122* 105* 94 109* 115* -- --  BUN 77* 74* 70* 40* 58* -- --  CREATININE 6.41* 6.32* 5.91* 4.03* 5.28* -- --  CALCIUM 9.7 10.0 9.7 9.4 9.7 -- --  MG -- 1.5 -- 1.5 1.5 -- 1.6  PHOS -- 2.5 2.5 2.2* 1.8* 1.9* --   Liver Function Tests:  Lab 05/24/12 0530 05/22/12 0816 05/20/12 0600 05/18/12 0838  AST 28 -- 37 37  ALT 30 -- 32 28  ALKPHOS 159* -- 156* 135*  BILITOT 1.9* -- 1.8* 1.9*  PROT 5.3* -- 5.2* 5.3*  ALBUMIN 1.6* 1.5* 1.5* 1.5*   No results found for this basename: LIPASE:5,AMYLASE:5 in the last 168 hours No results found for this basename: AMMONIA:5 in the last 168 hours CBC:  Lab 05/24/12 0530 05/22/12 0816 05/20/12 0600  WBC 7.4 9.2 9.0  NEUTROABS 4.9 -- --  HGB 11.9* 11.3* 10.7*  HCT 35.3* 33.3* 32.4*  MCV 82.7 81.2 84.2  PLT 117* 137* 132*   Cardiac Enzymes: No results found for this basename:  CKTOTAL:5,CKMB:5,CKMBINDEX:5,TROPONINI:5 in the last 168 hours BNP (last 3 results) No results found for this basename: PROBNP:3 in the last 8760 hours CBG:  Lab 05/24/12 1236 05/24/12 0002 05/23/12 1617 05/23/12 0817 05/22/12 2325  GLUCAP 118* 138* 152* 99 106*    No results found for this or any previous visit (from the past 240 hour(s)).   Studies: Ct Angio Chest Pe W/cm &/or Wo Cm  04/25/2012  *RADIOLOGY REPORT*  Clinical Data: Chest pain and cough.  CT ANGIOGRAPHY CHEST  Technique:  Multidetector CT imaging of the chest using the standard protocol  during bolus administration of intravenous contrast. Multiplanar reconstructed images including MIPs were obtained and reviewed to evaluate the vascular anatomy.  Contrast: OMNIPAQUE IOHEXOL 350 MG/ML SOLN  Comparison: None.  Findings: Technically adequate study with good opacification of the central and segmental pulmonary arteries.  No focal filling defects.  No evidence of significant pulmonary embolus.  Normal heart size.  Normal caliber thoracic aorta without dissection. Aortic and coronary artery calcifications.  Enteric tube with tip below the level of the stomach, not visualized.  Small amount of mucous in the trachea.  Airways generally appear patent.  There is a small left pleural effusion with basilar atelectasis or consolidation on the left.  Vague patchy infiltration in the left upper lung may represent edema or pneumonia.  No pneumothorax. There is pneumoperitoneum noted in the upper abdomen.  This likely results from recent surgery with noted laparotomy on 04/22/2012. Normal alignment of the thoracic spine.  IMPRESSION: No evidence of significant pulmonary embolus or aortic dissection. Small left pleural effusion with basilar atelectasis or consolidation.  Vague patchy infiltration in the left upper lung. Abdominal pneumoperitoneum consistent with recent surgery.  Original Report Authenticated By: Marlon Pel, M.D.   Ct Guided  Abscess Drain  04/30/2012  *RADIOLOGY REPORT*  Indication: Post surgery for small bowel obstruction, now with anastomotic leak and large intra-abdominal abscess.  CT GUIDED RIGHT LOWER QUADRANT ABDOMINAL DRAINAGE CATHETER PLACEMENT  Comparison: CT abdomen pelvis - 04/29/2012  Medications: Fentanyl 75 mcg IV; Versed 1.5 mg IV  Total Moderate Sedation time: 20 minutes  Contrast: None  Complications: None immediate  Technique / Findings:  Informed written consent was obtained from the patient after a discussion of the risks, benefits and alternatives to treatment. The patient was placed supine on the CT gantry and a pre procedural CT was performed re-demonstrating the known abscess/fluid collection within the right lower abdominal quadrant.  The procedure was planned.   A timeout was performed prior to the initiation of the procedure.  The skin overlying the right lower abdomen was prepped and draped in the usual sterile fashion.   The overlying soft tissues were anesthetized with 1% lidocaine with epinephrine.  An 18 gauge trocar needle was advanced in to the abscess/fluid collection and a Clara Smolen Amplatz super stiff wire was coiled within the abscess/fluid collection.   Appropriate positioning was confirmed with a limited CT scan.  The tract was serially dilated allowing placement of a 10 [place] all-purpose drainage catheter.  Appropriate positioning was confirmed with a limited postprocedural CT scan.  200 ml of bloody, slightly purulent fluid was aspirated.  The tube was connected to a drainage bag and sutured in place.  A dressing was placed.  The patient tolerated the procedure well without immediate post procedural complication.  Impression:  Successful CT guided placement of a 10 [place] all purpose drain catheter into the right lower abdomen with aspiration of 200 mL of bloody, slightly purulent fluid.  Samples were sent to the laboratory as requested by the ordering clinical team.  Original Report Authenticated  By: Waynard Reeds, M.D.   Ct Abdomen Pelvis W Contrast  05/04/2012  *RADIOLOGY REPORT*  Clinical Data: Status post surgery to treat the small bowel obstruction.  A right lower quadrant abscess was treated with percutaneous drainage on 04/30/2012.  CT ABDOMEN AND PELVIS WITH CONTRAST  Technique:  Multidetector CT imaging of the abdomen and pelvis was performed following the standard protocol during bolus administration of intravenous contrast.  Contrast:  75  ml Omnipaque-300 IV  Comparison: 04/30/2012 and 04/29/2012  Findings: Visualized lung bases show a left basilar pleural effusion and consolidation/atelectasis of the posterior left lower lobe.  There is extravasation of oral contrast from the small bowel into a residual abscess cavity within the right lower quadrant of the abdomen.  The percutaneous drain is located in the posterior/dependent aspect of the cavity and shows some surrounding debris which may be acting to occlude/partially occlude the catheter.  The abscess cavity remains of significant size, measuring over 10 cm in greatest dimensions.  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  Consider repositioning/upsizing of the current percutaneous drain or placement of a second drain into the more anterior aspect of the residual collection.  No new abscess collections are identified. There is no evidence of gross free intraperitoneal air.  Solid organs of the abdomen are stable in appearance.  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 Indwelling percutaneous drain lies in  the dependent portion of this collection which may contain debris obstructing the catheter. Given the large residual collection present, further drain manipulation including potential repositioning and upsizing of the current drain and also potentially placement of a second more anteriorly positioned drain may be helpful.  This will be communicated to the interventional Radiology service.  Original Report Authenticated By: Reola Calkins, M.D.   Ct Abdomen Pelvis W Contrast  04/29/2012  *RADIOLOGY REPORT*  Clinical Data: Post surgery for small bowel obstruction in an anastomotic leak.  Leukocytosis.  Dialysis patient.  CT ABDOMEN AND PELVIS WITH CONTRAST  Technique:  Multidetector CT imaging of the abdomen and pelvis was performed following the standard protocol during bolus administration of intravenous contrast.  Contrast: OMNIPAQUE IOHEXOL 300 MG/ML  SOLN  Comparison: 04/03/2012  Findings: New small left pleural effusion with basilar atelectasis or consolidation on the left.  Enteric tube with tip in the distal stomach.  The liver and spleen appear homogeneous without enlargement.  Portal and mesenteric vessels appear patent.  The gallbladder is moderately distended and is filled with increased density material suggesting vicarious contrast excretion. Pancreatic duct is diffusely dilated as seen previously.  No obstructing mass or stone is visualized.  Consider MRCP for further evaluation.  Extensive calcification of the abdominal aorta, iliac vessels, and splenic artery.  Calcification of the mesenteric artery with flow demonstrated.  Left adrenal gland nodule measuring 14 mm diameter.  This appears stable since the previous study but density measures are indeterminate.  No significant retroperitoneal lymphadenopathy.  Bilateral renal atrophy with multiple bilateral renal cysts.  No hydronephrosis.  The stomach, small bowel, and colon are decompressed.  No residual small bowel obstruction. Interval  postoperative changes with midline surgical defect along the anterior abdominal wall and skin clips in the left groin region.  There is interval development of a large gas and fluid collections in the right pelvis extending from anteriorly to posteriorly and extending up into the right lower quadrant.  This collection measures up to about 16.5 x 13.7 x 6.4 cm.  The collection has a mildly thickened enhancing wall and air-fluid levels are  present.  The appearance is consistent with loculated fluid or abscess.  There are additional loculated fluid collections in the anterior right lower quadrant measuring 3.3 x .9 centimeters and between the medial ascending colon and psoas muscle measuring 2.6 x 2.6 cm.  Sulci are consistent with abscesses.  Probably loculated fluid collection at the tip of the liver inferiorly. Infiltration into the mesenteric fat in the upper abdomen around the spleen and liver may represent edema.  No loculation is apparent.  Pelvis:  Pelvic transplant kidney with diffuse atrophy and prominent renal sinus fat.  No hydronephrosis.  Vascular calcification in the graft.  There  is a vascular graft extending from the common femoral vein inferiorly and from the common femoral artery laterally on the left.  Extensive calcification of the native femoral arteries bilaterally with suggestion of significant stenosis of the right common femoral artery.  Edema in the subcutaneous soft tissues particularly on the left.  Normal alignment of the lumbar vertebra.  IMPRESSION: Interval postoperative changes.  No residual bowel obstruction. Multiple loculated fluid collections including a very large right abdominal and pelvic collection consistent with abscesses. Vicarious contrast excretion into the gallbladder.  New left pleural effusion and basilar atelectasis.  Additional incidental findings are stable since the previous study.  Results were telephoned to and Marchelle Folks, the patient's nurse on [id number] at the time  of dictation, 2129 hours on 04/29/2012.  Original Report Authenticated By: Marlon Pel, M.D.   Ir Catheter Tube Change  05/05/2012  *RADIOLOGY REPORT*  Clinical history:51 year old with multiple medical problems including a small bowel leak.  The patient has a right lower quadrant drain which is incompletely draining the large abscess collection.  PROCEDURE(S): EXCHANGE AND REPOSITIONING OF THE PERCUTANEOUS DRAINAGE CATHETER WITH FLUOROSCOPY  Physician: Rachelle Hora. Henn, MD  Medications:Versed 2 mg, Fentanyl 25 mcg. A radiology nurse monitored the patient for moderate sedation.  Moderate sedation time:22 minutes  Fluoroscopy time: 3.8 minutes  Procedure:Informed consent was obtained for a drain repositioning and exchange.  The patient was placed supine on the interventional table.  The existing catheter in the right lower quadrant was prepped and draped in a sterile fashion.  Maximal barrier sterile technique was utilized including caps, mask, sterile gowns, sterile gloves, sterile drape, hand hygiene and skin antiseptic.  Catheter was injected with contrast and the catheter was cut and removed over a Bentson wire.  A Kumpe catheter was advanced into the upper portion of the collection.  Position was confirmed with ultrasound. A 12-French biliary drain was advanced over a Bentson wire and into the superior aspect the collection.  Greater than 200 ml of dark brown fluid was removed.  Catheter sutured to the skin and attached to a gravity bag.  Findings:The old catheter was located within the inferior posterior aspect of the abscess collection.  Adjacent to the collection, there is high density material which is probably related to the small bowel fistula or leak.  Catheter and wire were advanced into the anterior and superior aspect of the collection.  Biliary drain was also advanced into the superior aspect of the collection. Greater than 200 ml of dark brown fluid was removed.  Complications: None   Impression:Successful exchange and repositioning of the drainage catheter.  The large abscess was successfully decompressed following placement of the new catheter.  Original Report Authenticated By: Richarda Overlie, M.D.   Ir Fluoro Guide Cv Line Left  04/23/2012  *RADIOLOGY REPORT*  Clinical Data/Indication: BOWEL OBSTRUCTION.  CENTRAL VENOUS ACCESS REQUESTED.  IR LEFT FLOURO GUIDE CV LINE  Fluoroscopy Time: 2.5 minutes.  Procedure: The procedure, risks, benefits, and alternatives were explained to the patient. Questions regarding the procedure were encouraged and answered. The patient understands and consents to the procedure.  The right neck was prepped with betadine in a sterile fashion, and a sterile drape was applied covering the operative field. A sterile gown and sterile gloves were used for the procedure.  Sonographic imaging over the right neck demonstrate a collateral venous structures only without a viable large central venous structure.  Under sonographic guidance, a micropuncture needle was inserted into one of the collateral vessels and removed over a 0018 wire.  The wire could not be advanced centrally.  Central venous access from the right neck was aborted.  Subsequently, under sonographic guidance, a micropuncture needle was inserted into the left internal jugular vein and removed over a 0.8 wire.  A 3-French dilator was inserted.  Central venography confirms central venous occlusion.  Findings: Central venous occlusion into the SVC is demonstrated. Center venous access was not placed.  Complications: None.  IMPRESSION: Central venous access was not placed.  Upper extremity and internal jugular vein center venous access is chronically occluded.  Original Report Authenticated By: Donavan Burnet, M.D.   Ir US Guide Vasc Access Left  04/23/2012  Please refer to accession number [id number].  Original Report Authenticated By: Donavan Burnet, M.D.   Dg Chest Port 1 View  05/05/2012  *RADIOLOGY REPORT*   Clinical Data: Chest pain.  PORTABLE CHEST - 1 VIEW  Comparison: 04/28/2012  Findings: Nasogastric tube extends at least as   the   stomach, tip not seen.  Left lower lung consolidation / atelectasis has partially improved since the previous exam.  Right lung remains clear.  Probable small left pleural effusion.  Regional bones unremarkable.  IMPRESSION:  1.  Slight improvement in the left lower lung consolidation / atelectasis, with persistent small effusion.  Original Report Authenticated By: Osa Craver, M.D.   Dg Chest Port 1 View  04/28/2012  *RADIOLOGY REPORT*  Clinical Data: Progressive leukocytosis.  PORTABLE CHEST - 1 VIEW  Comparison: Chest x-ray dated 04/23/2012 and chest CT dated 04/25/2012  Findings: There is a persistent moderate left effusion with compressive atelectasis in the left lower lobe.  Heart size and vascularity are normal.  Right lung is clear.  NG tube tip is below the diaphragm.  IMPRESSION: Progressive left pleural effusion and left lower lobe atelectasis.  Original Report Authenticated By: Gwynn Burly, M.D.   Dg Chest Port 1 View  04/23/2012  *RADIOLOGY REPORT*  Clinical Data: Endotracheal tube placed  PORTABLE CHEST - 1 VIEW  Comparison: 04/15/2012  Findings: Endotracheal tube tip 7.2 cm proximal to the carina. Interval removal of the left IJ catheter.  Hyperinflated lungs. Mild retrocardiac opacity and left hemidiaphragm elevation.  No pneumothorax.  A enteric tube descends into the abdomen, tip not visualized.  There is nonspecific metallic density projecting over the left upper quadrant of the abdomen.  No acute osseous finding.  IMPRESSION: Endotracheal and enteric tubes as above.  Retrocardiac opacity; atelectasis, aspiration, or infiltrate.  Hyperinflation.  Original Report Authenticated By: Waneta Martins, M.D.   Dg Abd 2 Views  04/20/2012  *RADIOLOGY REPORT*  Clinical Data: Postop abdominal distention, recent of abdominal surgery for small bowel  obstruction and lysis of the patient with partial small bowel resection  ABDOMEN - 2 VIEW  Comparison: Portable abdomen film of 04/16/2012  Findings:  Both large and small bowel gas is present which may indicate postoperative ileus.  However there does appear be more distention of small bowel loops and a partial small bowel obstruction cannot be excluded.  Some contrast is scattered throughout the bowel.  Arterial calcifications are noted.  IMPRESSION: Distention of both large and small bowel may indicate ileus, but a partial small bowel obstruction cannot be excluded.  Original Report Authenticated By: Juline Patch, M.D.   Dg Abd Portable 1v  04/28/2012  *RADIOLOGY REPORT*  Clinical Data: Abdominal pain.  Ileus.  Progressive leukocytosis.  PORTABLE ABDOMEN - 1 VIEW  Comparison: Radiographs dated 04/20/2012  Findings: NG tube tip is in the body of the stomach.  There are no dilated loops of bowel.  Double-lumen catheter is in the right femoral vein with the tips in the inferior vena cava.  Contrast is seen in the gallbladder.  There is also scattered high density material in the bowel.  IMPRESSION: No acute abnormalities.  Original Report Authenticated By: Gwynn Burly, M.D.   Dg Abd Portable 1v  04/16/2012  *RADIOLOGY REPORT*  Clinical Data: No bowel activity since exploratory laparoscopy 2 days ago question ileus, right lower quadrant pain  PORTABLE ABDOMEN - 1 VIEW  Comparison: 04/13/2012  Findings: Nasogastric tube in stomach. Retained contrast in colon. Surgical clips in pelvis bilaterally. Scattered atherosclerotic calcifications. No bowel dilatation, bowel wall thickening, or evidence of obstruction. Bowel staple lines noted in right mid abdomen.  IMPRESSION: Nonobstructive bowel gas pattern. Resolution of small bowel distention since prior exam.  Original Report Authenticated By: Lollie Marrow, M.D.   Ir Shuntogram/ Fistulagram Left Mod Sed  05/10/2012  *RADIOLOGY REPORT*  Clinical Data: Left leg  swelling.  Graft revision 2 weeks ago.  AV SHUNTOGRAM  Procedure:  The left thigh was prepped and draped in a sterile fashion.  An 18 gauge Angiocath was inserted into the AV graft. Contrast was injected.  The Angiocath was removed.  Hemostasis was achieved with direct pressure. No complication.  Findings: The arterial anastomosis and graft are widely patent. There is moderate narrowing at the venous anastomosis.  There is associated narrowing of the external iliac vein.  Central venous structures are otherwise patent  IMPRESSION: Venous anastomotic and external iliac vein narrowing.  Otherwise patent circuit.   Original Report Authenticated By: Donavan Burnet, M.D. ( 05/10/2012 13:05:15 )     Scheduled Meds:    . antiseptic oral rinse  15 mL Mouth Rinse q12n4p  . chlorhexidine  15 mL Mouth Rinse BID  . darbepoetin (ARANESP) injection - DIALYSIS  100 mcg Intravenous Q Sat-HD  . fentaNYL  37.5 mcg Transdermal Q72H  . fluconazole (DIFLUCAN) IV  400 mg Intravenous Q T,Th,Sa-HD  . heparin  40 Units/kg Dialysis Once in dialysis  . hydrocortisone sod succinate (SOLU-CORTEF) injection  20 mg Intravenous Daily  . insulin aspart  0-15 Units Subcutaneous Q8H  . lip balm   Topical BID  . metoprolol  10 mg Intravenous Q6H  . pantoprazole (PROTONIX) IV  40 mg Intravenous Daily  . piperacillin-tazobactam (ZOSYN)  IV  2.25 g Intravenous Q8H  . sodium chloride  10-40 mL Intracatheter Q12H   Continuous Infusions:    . fat emulsion    . TPN (CLINIMIX) +/- additives 70 mL/hr at 05/22/12 1812  . TPN (CLINIMIX) +/- additives 70 mL/hr at 05/23/12 1803  . TPN (CLINIMIX) +/- additives      Principal Problem:  *SBO (small bowel obstruction) s/p EL/LOA and  SBR x 2 Active Problems:  CKD (chronic kidney disease) stage V requiring chronic dialysis  Thrombocytopenia  Gout  HTN (hypertension)  Chronic use of steroids  Malnutrition, calorie  Leg graft occlusion  Acute respiratory failure with hypoxia  Sinus  tachycardia  Abscess of abdominal cavity  Wound dehiscence  Bleeding  Anemia     Time spent:  30 minutes  Francisco Eyerly  [facility] Pager [phone]. If 8PM-8AM, please contact night-coverage at [email or web address], password [facility] 05/22/2012, 12:59 PM  LOS: 50 days

## 2012-05-24 NOTE — Progress Notes (Signed)
Patient ID: [name], male   DOB: Apr 20, 1961, 51 y.o.   MRN: [id number] 27 Days Post-Op  Subjective: Pt seems a bit upset.  He won't speak much this morning.  Currently in HD  Objective: Vital signs in last 24 hours: Temp:  [97.1 F (36.2 C)-98.9 F (37.2 C)] 97.1 F (36.2 C) (09/02 0741) Pulse Rate:  [66-80] 76  (09/02 0830) Resp:  [16-18] 18  (09/02 0600) BP: (133-200)/(63-81) 163/70 mmHg (09/02 0830) SpO2:  [97 %-100 %] 98 % (09/02 0741) Weight:  [112 lb 3.4 oz (50.9 kg)-118 lb 13.3 oz (53.9 kg)] 118 lb 13.3 oz (53.9 kg) (09/02 0720) Last BM Date: 05/18/12  Intake/Output from previous day: 09/01 0701 - 09/02 0700 In: 10  Out: 50 [Urine:50] Intake/Output this shift:    PE: Abd: soft, no change.  Drain is stable with thing blood-tinged bilious appearing output.  Lab Results:   Basename 05/24/12 0530 05/22/12 0816  WBC 7.4 9.2  HGB 11.9* 11.3*  HCT 35.3* 33.3*  PLT 117* 137*   BMET  Basename 05/24/12 0530 05/22/12 0816  NA 128* 127*  K 3.6 3.2*  CL 91* 92*  CO2 20 20  GLUCOSE 105* 94  BUN 74* 70*  CREATININE 6.32* 5.91*  CALCIUM 10.0 9.7   PT/INR No results found for this basename: LABPROT:2,INR:2 in the last 72 hours CMP     Component Value Date/Time   NA 128* 05/24/2012 0530   K 3.6 05/24/2012 0530   CL 91* 05/24/2012 0530   CO2 20 05/24/2012 0530   GLUCOSE 105* 05/24/2012 0530   BUN 74* 05/24/2012 0530   CREATININE 6.32* 05/24/2012 0530   CALCIUM 10.0 05/24/2012 0530   PROT 5.3* 05/24/2012 0530   ALBUMIN 1.6* 05/24/2012 0530   AST 28 05/24/2012 0530   ALT 30 05/24/2012 0530   ALKPHOS 159* 05/24/2012 0530   BILITOT 1.9* 05/24/2012 0530   GFRNONAA 9* 05/24/2012 0530   GFRAA 11* 05/24/2012 0530   Lipase  No results found for this basename: lipase       Studies/Results: No results found.  Anti-infectives: Anti-infectives     Start     Dose/Rate Route Frequency Ordered Stop   05/22/12 1200   fluconazole (DIFLUCAN) IVPB 400 mg        400 mg 200 mL/hr over 60  Minutes Intravenous Every T-Th-Sa (Hemodialysis) 05/21/12 1108     05/20/12 2000   fluconazole (DIFLUCAN) IVPB 400 mg  Status:  Discontinued        400 mg 200 mL/hr over 60 Minutes Intravenous Once per day on Mon Tue Thu Sat 05/19/12 1417 05/21/12 1108   05/20/12 1700  piperacillin-tazobactam (ZOSYN) IVPB 2.25 g       2.25 g 100 mL/hr over 30 Minutes Intravenous Every 8 hours 05/20/12 1634     05/17/12 1600   fluconazole (DIFLUCAN) IVPB 200 mg        200 mg 100 mL/hr over 60 Minutes Intravenous  Once 05/17/12 1129 05/17/12 1940   05/08/12 1200   fluconazole (DIFLUCAN) IVPB 400 mg  Status:  Discontinued        400 mg 200 mL/hr over 60 Minutes Intravenous Every T-Th-Sa (Hemodialysis) 05/07/12 1752 05/19/12 1417   04/30/12 1400   vancomycin (VANCOCIN) 750 mg in sodium chloride 0.9 % 150 mL IVPB  Status:  Discontinued        750 mg 150 mL/hr over 60 Minutes Intravenous  Once 04/30/12 1212 04/30/12 1507   04/29/12 2330  vancomycin (VANCOCIN) 1,500 mg in sodium chloride 0.9 % 500 mL IVPB        1,500 mg 250 mL/hr over 120 Minutes Intravenous  Once 04/29/12 2259 04/30/12 0246   04/27/12 0000   ceFAZolin (ANCEF) IVPB 1 g/50 mL premix  Status:  Discontinued     Comments: Send with pt to OR      1 g 100 mL/hr over 30 Minutes Intravenous On call 04/26/12 0927 04/26/12 0928   04/24/12 1000   micafungin (MYCAMINE) 100 mg in sodium chloride 0.9 % 100 mL IVPB  Status:  Discontinued        100 mg 100 mL/hr over 1 Hours Intravenous Daily 04/24/12 0750 05/07/12 1713   04/23/12 1600   fluconazole (DIFLUCAN) IVPB 400 mg        400 mg 200 mL/hr over 60 Minutes Intravenous  Once 04/23/12 1428 04/23/12 1800   04/22/12 2200   piperacillin-tazobactam (ZOSYN) IVPB 3.375 g  Status:  Discontinued        3.375 g 100 mL/hr over 30 Minutes Intravenous 3 times per day 04/22/12 2120 04/22/12 2137   04/22/12 2200   piperacillin-tazobactam (ZOSYN) IVPB 2.25 g  Status:  Discontinued        2.25 g 100 mL/hr  over 30 Minutes Intravenous 3 times per day 04/22/12 2140 05/20/12 0748   04/22/12 2157   gentamicin (GARAMYCIN) injection  Status:  Discontinued          As needed 04/22/12 2158 04/22/12 2335   04/22/12 2156   clindamycin (CLEOCIN) injection  Status:  Discontinued          As needed 04/22/12 2157 04/22/12 2335   04/22/12 2145  piperacillin-tazobactam (ZOSYN) IVPB 3.375 g       3.375 g 12.5 mL/hr over 240 Minutes Intravenous  Once 04/22/12 2141 04/23/12 0145   04/22/12 2130   gentamicin (GARAMYCIN) 240 mg, clindamycin (CLEOCIN) 900 mg in sodium chloride irrigation 0.9 % 1,000 mL irrigation  Status:  Discontinued         Irrigation  Once 04/22/12 2119 04/23/12 0020   04/14/12 0830   ertapenem (INVANZ) 1 g in sodium chloride 0.9 % 50 mL IVPB        1 g 100 mL/hr over 30 Minutes Intravenous To Surgery 04/14/12 0816 04/14/12 0834   04/13/12 1359   ertapenem (INVANZ) 1 g in sodium chloride 0.9 % 50 mL IVPB  Status:  Discontinued        1 g 100 mL/hr over 30 Minutes Intravenous 60 min pre-op 04/13/12 1359 04/14/12 0816           Assessment/Plan  1. EC fistula 2. SB leak 3. HD 4. PCM/TNA  Plan: 1. Will get a CT today to eval SB leak and abscess cavity.  Hopefully, if this looks ok then we may be able to try some liquids soon.   LOS: 52 days    Meah Jiron E 05/24/2012

## 2012-05-24 NOTE — Procedures (Signed)
Pt seen on HD.  K 3.5 switch to 4 K bath.  AP 100, Vp 200. For CT today.

## 2012-05-25 ENCOUNTER — Inpatient Hospital Stay (HOSPITAL_COMMUNITY): Payer: Medicare Other

## 2012-05-25 LAB — GLUCOSE, CAPILLARY: Glucose-Capillary: 115 mg/dL — ABNORMAL HIGH (ref 70–99)

## 2012-05-25 LAB — COMPREHENSIVE METABOLIC PANEL
Albumin: 1.5 g/dL — ABNORMAL LOW (ref 3.5–5.2)
Alkaline Phosphatase: 161 U/L — ABNORMAL HIGH (ref 39–117)
BUN: 50 mg/dL — ABNORMAL HIGH (ref 6–23)
Chloride: 90 mEq/L — ABNORMAL LOW (ref 96–112)
GFR calc Af Amer: 15 mL/min — ABNORMAL LOW (ref >90)
Glucose, Bld: 97 mg/dL (ref 70–99)
Potassium: 3.5 mEq/L (ref 3.5–5.1)
Total Bilirubin: 1.6 mg/dL — ABNORMAL HIGH (ref 0.3–1.2)

## 2012-05-25 LAB — CBC
HCT: 34.9 % — ABNORMAL LOW (ref 39.0–52.0)
MCHC: 34.1 g/dL (ref 30.0–36.0)
RDW: 18.5 % — ABNORMAL HIGH (ref 11.5–15.5)

## 2012-05-25 MED ORDER — INSULIN REGULAR HUMAN 100 UNIT/ML IJ SOLN
INTRAVENOUS | Status: AC
  Administered 2012-05-25: 18:00:00 via INTRAVENOUS
  Filled 2012-05-25: qty 2000

## 2012-05-25 MED ORDER — LABETALOL HCL 5 MG/ML IV SOLN
10.0000 mg | Freq: Four times a day (QID) | INTRAVENOUS | Status: DC
  Administered 2012-05-25 – 2012-05-30 (×17): 10 mg via INTRAVENOUS
  Administered 2012-05-30: 07:00:00 via INTRAVENOUS
  Administered 2012-05-30 – 2012-06-02 (×11): 10 mg via INTRAVENOUS
  Filled 2012-05-25 (×36): qty 4

## 2012-05-25 MED ORDER — SIMETHICONE 40 MG/0.6ML PO SUSP
80.0000 mg | Freq: Four times a day (QID) | ORAL | Status: DC
  Administered 2012-05-25 – 2012-06-05 (×33): 80 mg via ORAL
  Filled 2012-05-25 (×46): qty 1.2

## 2012-05-25 NOTE — Progress Notes (Signed)
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 Allopurinol     REACTION: decreased platelets  . Aspirin     REACTION: unspecified    Patient Measurements: Height: 5\' 11"  (180.3 cm) Weight: 115 lb 4.8 oz (52.3 kg) IBW/kg (Calculated) : 75.3    Vital Signs: Temp: 97 F (36.1 C) (09/03 0818) Temp src: Oral (09/03 0818) BP: 136/77 mmHg (09/03 1100) Pulse Rate: 77  (09/03 1100) Intake/Output from previous day: 09/02 0701 - 09/03 0700 In: 105 [IV Piggyback:100] Out: 3500  Intake/Output from this shift:    Labs:  [facility] 05/25/12 0907 05/24/12 0530  WBC 6.2 7.4  HGB 11.9* 11.9*  HCT 34.9* 35.3*  PLT 110* 117*  APTT -- --  INR -- --     [facility] 05/25/12 0907 05/24/12 0757 05/24/12 0530  NA 128* 127* 128*  K 3.5 3.5 3.6  CL 90* 91* 91*  CO2 24 19 20   GLUCOSE 97 122* 105*  BUN 50* 77* 74*  CREATININE 4.85* 6.41* 6.32*  LABCREA -- -- --  CREAT24HRUR -- -- --  CALCIUM 9.5 9.7 10.0  MG -- -- 1.5  PHOS -- -- 2.5  PROT 5.2* -- 5.3*  ALBUMIN 1.5* -- 1.6*  AST 36 -- 28  ALT 30 -- 30  ALKPHOS 161* -- 159*  BILITOT 1.6* -- 1.9*  BILIDIR -- -- --  IBILI -- -- --  PREALBUMIN -- -- 24.7  TRIG -- -- 128  CHOLHDL -- -- --  CHOL -- -- 158   Estimated Creatinine Clearance: 13.2 ml/min (by C-G formula based on Cr of 4.85).    Basename 05/24/12 2325 05/24/12 1841 05/24/12 1236  GLUCAP 135* 131* 118*   Insulin Requirements in the past 24 hours:  4 units Novolog SSI and 15 units regular insulin in TNA  Nutritional Goals:  1700-1950 kCal, 80-100 grams of protein per day   Current Nutrition:  Clinimix 5/15 at 70 ml/hr with 20% lipids at 10 ml/hr MWF provides an average of 1398 kcal and 84g protein per day  Admit: Admitted 7/13 with abd pain, weight loss, SBO. S/p LOA, SBR 7/24 and repeat LOA, SBR with anasomotic leak repair 8/1, thrombectomy with AVG revision 7/25 and repeat  thrombectomy 8/6. 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  GI: Tolerating TPN. Pt allowed to have ice chips and sips of water. Pt's wt appears to be relatively stable. Noted plans for CT abd to assess RLQ site as drain output does not look enteric. Gravity drain bag due to controlled SB.   Endo: CBGs well controlled. CBGs controlled on minimal SSI.  Lytes: Na low at 128 dt/ overload. K stable at 3.5. Hypercalcemia continues (corrected Ca 11.7, Ca x PO4 remains < 55). Renal MD replacing lytes.  Renal: ESRD with HD typically TTS. HD is currently being administered prn d/t volume issues. Currently 4x/week HD MTTS while pt requiring TPN - Noted plan to try and cut back to T/T/S. On Aranesp qSat.    Cards: BP variable. (continues with hypotension during HD). On scheduled lopressor and prn hydralazine.   Hepatobil: LFTs WNL 8/29 except alk phos/Tbili elevated. Prealbumin remains at goal. Trig WNL 128  Neuro: GCS 15, no c/o pain - on fentanyl patch, prn pain meds.   ID: Fluconazole + Zosyn for peritonitis/abd abscess -- Noted primary team plans to cont abx thro 9/16, then re-CT abd and consult with ID for  further recs on abx duration. Afeb, WBC WNL. Abd abscess culture: candida tropicalis.   Zosyn 8.1>>  Diflucan 8.16>> (s/p 14 d mycamine)  Heme/Onc: Hgb up to 11.9 on Aranesp q Saturday.  Best Practices: Mouthcare, PPI po  Plan:  1. Continue Clinimix 5/15 (no lytes) @ 70 ml/hr to avoid exacerbating volume status. MD/RD aware TPN not meeting kcal goals with Clinimix alone. Pt appears to be maintaining weight and prealbumin on current regimen. 2. Will defer electrolyte replacement to renal MD 3. Provide available trace elements, MVI and lipids @10cc /hr on MWF only d/t national backorder  4. Will f/up CT results in AM  Thanks, Yovanny Coats K. Allena Katz, PharmD, BCPS.  Clinical Pharmacist Pager [phone]. 05/25/2012 11:37 AM

## 2012-05-25 NOTE — Progress Notes (Signed)
[facility] PROGRESS NOTE  RIGLEY NIESS WUJ:[id number] DOB: 1961/02/10 DOA: 04/02/2012 PCP: Trevor Iha, MD   Assessment/Plan:  Principal Problem:  *SBO (small bowel obstruction) s/p EL/LOA and SBR x 2 Active Problems:  CKD (chronic kidney disease) stage V requiring chronic dialysis  Thrombocytopenia  Gout  HTN (hypertension)  Ileus following gastrointestinal surgery  Chronic use of steroids  Malnutrition, calorie  Leg graft occlusion  Acute respiratory failure with hypoxia  Sinus tachycardia  Abscess of abdominal cavity  Wound dehiscence  Bleeding  Anemia   SBO (small bowel obstruction) s/p exploratory laparotomy and lysis of adhesions and partial small bowel x 2 on 7/24.  Patient had subsequent anastomotic leak- omental patch of anastomosis on 8/2.  On 8/8, CT abdomen and pelvis demonstrated several fluid collections consistent with abscess, largest measuring 16.5 x 13.7 x 6.4 cm.  On 8/9, he underwent percutaneous drainage of largest fluid collection.  Repeat CT done for increasing WBC count on 8/13 revealed enlarging abscess and an enteric fistula with obstructed drainage tube. The drain was repositioned on 8/14.  Since that time, he has had an open abdominal wound which connects to an abscess cavity, which has been slowly closing.  General surgery has been following.  Per surgery, the right drain is likely to continue to drain fistula fluid and will require surgical correction.  The abdominal wound has been healing well and now that drainage is minimal.  Unable to tolerate contrast for CT so started on clears by surgery on 9/3. -  Add simethicone per patient request -  Zofran and phenergan still ordered -  Appreciate surgery recommendations   Peritonitis secondary to anastomotic dehiscence/intra-abdominal abscess ID had previously been following and recommended zosyn and diflucan until 9/16 and then repeat CT scan prior to discontinuing antibiotics.  Since the  patient has been improving with near closure of the abdominal wound, will talk to ID about duration of antibiotics as may be able to discontinue sooner.  Ileus, resolved.  Patient has excellent bowel sounds and is passing gas.  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  Patient should not bend the right hip per interventional radiology.    Chronic nonocclusive left lower extremity DVT.  Diagnosed via Doppler 04/30/2012 and follow up on 8/21 showed no evidence of DVT, superficial thrombosis or baker's cyst.  Resolved.  CKD (chronic kidney disease) stage V requiring chronic dialysis Dialysis per nephrology   HTN (hypertension), elevated today but has tendency to drop precipitously.  Has been persistently elevated for the last several days and was over 200 systolic briefly yesterday.   -  Change to IV labetalol  Anemia *Stable at 10.5. Due to acute blood loss and due to chronic disease - see above regarding wound bleeding  *Has received a total of 4 units of packed red blood cells this admission  *Cont aranesp and ferric gluconate   Chronic use of steroids  (gout).  Patient had been on pred 5mg  daily. Weaned IV hydrocortisone.  Currently at 25 mg daily.    -  Wean to 20mg  daily 9/2 (equivalent to pred 5) and continue until tolerating PO and try to  wean off if tolerated.   Leg graft occlusion  s/p thrombectomy 7/25 - unfortunately re clotted and undergone an additional thrombectomy procedure with revision of arterial and graft on 04/27/2012.  Was briefly dialyzed via HD cath in right groin but  currently left femoral graft is being used again.  See discussion per vascular surgery concerning narrowing at the venous anastomosis  Severe protein calorie malnutrition *Patient cachectic at presentation  *Parenteral nutrition managed by surgery and pharmacy.  Patient is not receiving adequate calories because of fluid volume.  Anticipate nutritional status will improve once patient tolerating some enteric nutrition.     Disposition  *As long as the patient remains on TNA, he would likely require dialysis 4 x  week.  * Pursuing LTAC for this patient at [facility] and per case management, patient may be able to go to LTAC if HD bed available even with 4X/wk HD.  Spoke with patient and his sister who stated they strongly prefer not to go to [facility] as they had a brother who died there.  Advised sister to schedule appointment to discuss this preference with social work.  Code Status: Full Family Communication: Not applicable Disposition Plan:  Pending LTAC bed.   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 He relates one day prior to admission he started having abdominal pain which progressively got worse it was mainly periumbilical nothing made it better or worse had no radiation. But in the middle of the night he took some TUMS and the pain did not improve. After this he started vomiting about 3 or 4 times. So he decided to come here to the emergency room. Here in the emergency room his pain was relieved by narcotics. A CT scan of the abdomen and pelvis was done that shows small bowel obstruction we were asked to mid and further evaluate.  Since his hospitalization the patient had exploratory laparotomy and lysis of adhesions as a result of a small bowel obstruction. He subsequently had  an anastomotic leak with an abscess which is has necessitated an omental patching of the anastomosis and percutaneous drain placement. The patient had an elevation of his white blood cell count and CT of the abdomen revealed an enlarging abscess an enteric fistula was obstructed drainage tube necessitated repositioning of the percutaneous drain a percutaneous drain. Presently the patient has an open abdominal wound which is connected with the abscess cavity and is being managed by general surgery and wound ostomy care.   Consultants: General surgery Nephrology Infectious disease the  Procedures: 7/24 Ex-lap, lysis of adhesions, partial small bowel resection x2  7/25 Thrombectomy L femoral AV graft  7/26 Extubated, CVL is out  7/27 Transferred to telemetry under [facility]  8/2 Acute abdomen, dehiscence of anastomosis, SBO, back from OR intubated in ICU on PCCM service. Found to have moderate peritonitis infraumbilically  8/2 Extubated   Antibiotics: Zosyn 8/1 >>> (tentative stop date of 9/16 with CT scan at that time) Micafungin 8/3 >>8/16  Invanz 7/24 periop  Fluconazole- 8/2 + 8/17 >>  (tentative stop date of 9/16 with CT scan at that time) Vancomycin 8/8 >>> 05/01/2012   HPI/Subjective:  Patient states he tolerated about 2 ounces of fluid by mouth today with gas.  He requests gas medication.  Denies chest pain, SOB, nausea, vomiting,  leakage from abdominal wound or increased output into right drain.  Objective: Filed Vitals:   05/25/12 1248 05/25/12 1351 05/25/12 1630 05/25/12 2021  BP: 138/78 150/81 150/68 158/81  Pulse: 81 85 72 73  Temp: 97.4 F (36.3 C) 98.2 F (36.8 C) 98.1 F (36.7 C) 98.5 F (36.9 C)  TempSrc: Oral Oral  Oral  Resp: 16 18 15 18   Height:      Weight: 50 kg (110 lb 3.7 oz)   49.624 kg (109 lb 6.4 oz)  SpO2:  96% 100% 99%   Weight change: 3 kg (6 lb 9.8 oz)  Intake/Output Summary (Last 24 hours) at 05/25/12 2115 Last data filed at 05/25/12 2029  Gross  per 24 hour  Intake   1107 ml  Output   2600 ml  Net  -1493 ml    General:  Cachectic AAM, lying in bed HEENT:  MMM Heart: Regular rate and rhythm, without murmurs, rubs, gallops.  Lungs: Clear to auscultation Abdomen:  Stable, soft, non-tender, active bowel sounds, mildly distended with gassy loops.  Patient has dry dressing on abdomen that is clean and dry. Percutaneous drain draining serosanguinous material on right groin.   Musculoskeletal:  Decreased tone and bulk.  No LEE, 2+ pulses Right groin vas-cath.  Data Reviewed: Basic Metabolic Panel:  Lab 05/25/12 1914 05/24/12 0757 05/24/12 0530 05/22/12 0816 05/21/12 0631 05/20/12 0600 05/19/12 1000  NA 128* 127* 128* 127* 133* -- --  K 3.5 3.5 3.6 3.2* 3.3* -- --  CL 90* 91* 91* 92* 96 -- --  CO2 24 19 20 20 24  -- --  GLUCOSE 97 122* 105* 94 109* -- --  BUN 50* 77* 74* 70* 40* -- --  CREATININE 4.85* 6.41* 6.32* 5.91* 4.03* -- --  CALCIUM 9.5 9.7 10.0 9.7 9.4 -- --  MG -- -- 1.5 -- 1.5 1.5 --  PHOS -- -- 2.5 2.5 2.2* 1.8* 1.9*   Liver Function Tests:  Lab 05/25/12 0907 05/24/12 0530 05/22/12 0816 05/20/12 0600  AST 36 28 -- 37  ALT 30 30 -- 32  ALKPHOS 161* 159* -- 156*  BILITOT 1.6* 1.9* -- 1.8*  PROT 5.2* 5.3* -- 5.2*  ALBUMIN 1.5* 1.6* 1.5* 1.5*   No results found for this basename: LIPASE:5,AMYLASE:5 in the last 168 hours No results found for this basename: AMMONIA:5 in the last 168 hours CBC:  Lab 05/25/12 0907 05/24/12 0530 05/22/12 0816 05/20/12 0600  WBC 6.2 7.4 9.2 9.0  NEUTROABS -- 4.9 -- --  HGB 11.9* 11.9* 11.3* 10.7*  HCT 34.9* 35.3* 33.3* 32.4*  MCV 81.5 82.7 81.2 84.2  PLT 110* 117* 137* 132*   Cardiac Enzymes: No results found for this basename: CKTOTAL:5,CKMB:5,CKMBINDEX:5,TROPONINI:5 in the last 168 hours BNP (last 3 results) No results found for this basename: PROBNP:3 in the last 8760 hours CBG:  Lab 05/25/12 1632 05/25/12 1400 05/24/12 2325 05/24/12 1841 05/24/12 1236  GLUCAP 115* 102*  135* 131* 118*    No results found for this or any previous visit (from the past 240 hour(s)).   Studies: Ct Angio Chest Pe W/cm &/or Wo Cm  04/25/2012  *RADIOLOGY REPORT*  Clinical Data: Chest pain and cough.  CT ANGIOGRAPHY CHEST  Technique:  Multidetector CT imaging of the chest using the standard protocol during bolus administration of intravenous contrast. Multiplanar reconstructed images including MIPs were obtained and reviewed to evaluate the vascular anatomy.  Contrast: OMNIPAQUE IOHEXOL 350 MG/ML SOLN  Comparison: None.  Findings: Technically adequate study  with good opacification of the central and segmental pulmonary arteries.  No focal filling defects.  No evidence of significant pulmonary embolus.  Normal heart size.  Normal caliber thoracic aorta without dissection. Aortic and coronary artery calcifications.  Enteric tube with tip below the level of the stomach, not visualized.  Small amount of mucous in the trachea.  Airways generally appear patent.  There is a small left pleural effusion with basilar atelectasis or consolidation on the left.  Vague patchy infiltration in the left upper lung may represent edema or pneumonia.  No pneumothorax. There is pneumoperitoneum noted in the upper abdomen.  This likely results from recent surgery with noted laparotomy on 04/22/2012. Normal alignment of the thoracic spine.  IMPRESSION: No evidence of significant pulmonary embolus or aortic dissection. Small left pleural effusion with basilar atelectasis or consolidation.  Vague patchy infiltration in the left upper lung. Abdominal pneumoperitoneum consistent with recent surgery.  Original Report Authenticated By: Marlon Pel, M.D.   Ct Guided Abscess Drain  04/30/2012  *RADIOLOGY REPORT*  Indication: Post surgery for small bowel obstruction, now with anastomotic leak and large intra-abdominal abscess.  CT GUIDED RIGHT LOWER QUADRANT ABDOMINAL DRAINAGE CATHETER PLACEMENT  Comparison: CT  abdomen pelvis - 04/29/2012  Medications: Fentanyl 75 mcg IV; Versed 1.5 mg IV  Total Moderate Sedation time: 20 minutes  Contrast: None  Complications: None immediate  Technique / Findings:  Informed written consent was obtained from the patient after a discussion of the risks, benefits and alternatives to treatment. The patient was placed supine on the CT gantry and a pre procedural CT was performed re-demonstrating the known abscess/fluid collection within the right lower abdominal quadrant.  The procedure was planned.   A timeout was performed prior to the initiation of the procedure.  The skin overlying the right lower abdomen was prepped and draped in the usual sterile fashion.   The overlying soft tissues were anesthetized with 1% lidocaine with epinephrine.  An 18 gauge trocar needle was advanced in to the abscess/fluid collection and a Caron Tardif Amplatz super stiff wire was coiled within the abscess/fluid collection.   Appropriate positioning was confirmed with a limited CT scan.  The tract was serially dilated allowing placement of a 10 [place] all-purpose drainage catheter.  Appropriate positioning was confirmed with a limited postprocedural CT scan.  200 ml of bloody, slightly purulent fluid was aspirated.  The tube was connected to a drainage bag and sutured in place.  A dressing was placed.  The patient tolerated the procedure well without immediate post procedural complication.  Impression:  Successful CT guided placement of a 10 [place] all purpose drain catheter into the right lower abdomen with aspiration of 200 mL of bloody, slightly purulent fluid.  Samples were sent to the laboratory as requested by the ordering clinical team.  Original Report Authenticated By: Waynard Reeds, M.D.   Ct Abdomen Pelvis W Contrast  05/04/2012  *RADIOLOGY REPORT*  Clinical Data: Status post surgery to treat the small bowel obstruction.  A right lower quadrant abscess was treated with percutaneous drainage on  04/30/2012.  CT ABDOMEN AND PELVIS WITH CONTRAST  Technique:  Multidetector CT imaging of the abdomen and pelvis was performed following the standard protocol during bolus administration of intravenous contrast.  Contrast:  75 ml Omnipaque-300 IV  Comparison: 04/30/2012 and 04/29/2012  Findings: Visualized lung bases show a left basilar pleural effusion and consolidation/atelectasis of the posterior left lower lobe.  There is extravasation of oral contrast from the  small bowel into a residual abscess cavity within the right lower quadrant of the abdomen.  The percutaneous drain is located in the posterior/dependent aspect of the cavity and shows some surrounding debris which may be acting to occlude/partially occlude the catheter.  The abscess cavity remains of significant size, measuring over 10 cm in greatest dimensions.  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  Consider repositioning/upsizing of the current percutaneous drain or placement of a second drain into the more anterior aspect of the residual collection.  No new abscess collections are identified. There is no evidence of gross free intraperitoneal air.  Solid organs of the abdomen are stable in appearance.  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 Indwelling percutaneous drain lies in the dependent portion of this collection which may contain debris obstructing the catheter. Given the large residual collection present, further drain manipulation including potential repositioning and upsizing of the current drain and also  potentially placement of a second more anteriorly positioned drain may be helpful.  This will be communicated to the interventional Radiology service.  Original Report Authenticated By: Reola Calkins, M.D.   Ct Abdomen Pelvis W Contrast  04/29/2012  *RADIOLOGY REPORT*  Clinical Data: Post surgery for small bowel obstruction in an anastomotic leak.  Leukocytosis.  Dialysis patient.  CT ABDOMEN AND PELVIS WITH CONTRAST  Technique:  Multidetector CT imaging of the abdomen and pelvis was performed following the standard protocol during bolus administration of intravenous contrast.  Contrast: OMNIPAQUE IOHEXOL 300 MG/ML  SOLN  Comparison: 04/03/2012  Findings: New small left pleural effusion with basilar atelectasis or consolidation on the left.  Enteric tube with tip in the distal stomach.  The liver and spleen appear homogeneous without enlargement.  Portal and mesenteric vessels appear patent.  The gallbladder is moderately distended and is filled with increased density material suggesting vicarious contrast excretion. Pancreatic duct is diffusely dilated as seen previously.  No obstructing mass or stone is visualized.  Consider MRCP for further evaluation.  Extensive calcification of the abdominal aorta, iliac vessels, and splenic artery.  Calcification of the mesenteric artery with flow demonstrated.  Left adrenal gland nodule measuring 14 mm diameter.  This appears stable since the previous study but density measures are indeterminate.  No significant retroperitoneal lymphadenopathy.  Bilateral renal atrophy with multiple bilateral renal cysts.  No hydronephrosis.  The stomach, small bowel, and colon are decompressed.  No residual small bowel obstruction. Interval postoperative changes with midline surgical defect along the anterior abdominal wall and skin clips in the left groin region.  There is interval development of a large gas and fluid collections in the right pelvis extending from anteriorly to  posteriorly and extending up into the right lower quadrant.  This collection measures up to about 16.5 x 13.7 x 6.4 cm.  The collection has a mildly thickened enhancing wall and air-fluid levels are present.  The appearance is consistent with loculated fluid or abscess.  There are additional loculated fluid collections in the anterior right lower quadrant measuring 3.3 x .9 centimeters and between the medial ascending colon and  psoas muscle measuring 2.6 x 2.6 cm.  Sulci are consistent with abscesses.  Probably loculated fluid collection at the tip of the liver inferiorly. Infiltration into the mesenteric fat in the upper abdomen around the spleen and liver may represent edema.  No loculation is apparent.  Pelvis:  Pelvic transplant kidney with diffuse atrophy and prominent renal sinus fat.  No hydronephrosis.  Vascular calcification in the graft.  There  is a vascular graft extending from the common femoral vein inferiorly and from the common femoral artery laterally on the left.  Extensive calcification of the native femoral arteries bilaterally with suggestion of significant stenosis of the right common femoral artery.  Edema in the subcutaneous soft tissues particularly on the left.  Normal alignment of the lumbar vertebra.  IMPRESSION: Interval postoperative changes.  No residual bowel obstruction. Multiple loculated fluid collections including a very large right abdominal and pelvic collection consistent with abscesses. Vicarious contrast excretion into the gallbladder.  New left pleural effusion and basilar atelectasis.  Additional incidental findings are stable since the previous study.  Results were telephoned to and Marchelle Folks, the patient's nurse on [facility]- [id number] at the time of dictation, 2129 hours on 04/29/2012.  Original Report Authenticated By: Marlon Pel, M.D.   Ir Catheter Tube Change  05/05/2012  *RADIOLOGY REPORT*  Clinical history:51 year old with multiple medical problems including a small  bowel leak.  The patient has a right lower quadrant drain which is incompletely draining the large abscess collection.  PROCEDURE(S): EXCHANGE AND REPOSITIONING OF THE PERCUTANEOUS DRAINAGE CATHETER WITH FLUOROSCOPY  Physician: Rachelle Hora. Henn, MD  Medications:Versed 2 mg, Fentanyl 25 mcg. A radiology nurse monitored the patient for moderate sedation.  Moderate sedation time:22 minutes  Fluoroscopy time: 3.8 minutes  Procedure:Informed consent was obtained for a drain repositioning and exchange.  The patient was placed supine on the interventional table.  The existing catheter in the right lower quadrant was prepped and draped in a sterile fashion.  Maximal barrier sterile technique was utilized including caps, mask, sterile gowns, sterile gloves, sterile drape, hand hygiene and skin antiseptic.  Catheter was injected with contrast and the catheter was cut and removed over a Bentson wire.  A Kumpe catheter was advanced into the upper portion of the collection.  Position was confirmed with ultrasound. A 12-French biliary drain was advanced over a Bentson wire and into the superior aspect the collection.  Greater than 200 ml of dark brown fluid was removed.  Catheter sutured to the skin and attached to a gravity bag.  Findings:The old catheter was located within the inferior posterior aspect of the abscess collection.  Adjacent to the collection, there is high density material which is probably related to the small bowel fistula or leak.  Catheter and wire were advanced into the anterior and superior aspect of the collection.  Biliary drain was also advanced into the superior aspect of the collection. Greater than 200 ml of dark brown fluid was removed.  Complications: None  Impression:Successful exchange and repositioning of the drainage catheter.  The large abscess was successfully decompressed following placement of the new catheter.  Original Report Authenticated By: Richarda Overlie, M.D.   Ir Fluoro Guide Cv Line  Left  04/23/2012  *RADIOLOGY REPORT*  Clinical Data/Indication: BOWEL OBSTRUCTION.  CENTRAL VENOUS ACCESS REQUESTED.  IR LEFT FLOURO GUIDE CV LINE  Fluoroscopy Time: 2.5 minutes.  Procedure: The procedure, risks, benefits, and alternatives were explained to the patient. Questions regarding the procedure were encouraged and answered. The patient understands  and consents to the procedure.  The right neck was prepped with betadine in a sterile fashion, and a sterile drape was applied covering the operative field. A sterile gown and sterile gloves were used for the procedure.  Sonographic imaging over the right neck demonstrate a collateral venous structures only without a viable large central venous structure.  Under sonographic guidance, a micropuncture needle was inserted into one of the collateral vessels and removed over a 0018 wire.  The wire could not be advanced centrally.  Central venous access from the right neck was aborted.  Subsequently, under sonographic guidance, a micropuncture needle was inserted into the left internal jugular vein and removed over a 0.8 wire.  A 3-French dilator was inserted.  Central venography confirms central venous occlusion.  Findings: Central venous occlusion into the SVC is demonstrated. Center venous access was not placed.  Complications: None.  IMPRESSION: Central venous access was not placed.  Upper extremity and internal jugular vein center venous access is chronically occluded.  Original Report Authenticated By: Donavan Burnet, M.D.   Ir US Guide Vasc Access Left  04/23/2012  Please refer to accession number [id number].  Original Report Authenticated By: Donavan Burnet, M.D.   Dg Chest Port 1 View  05/05/2012  *RADIOLOGY REPORT*  Clinical Data: Chest pain.  PORTABLE CHEST - 1 VIEW  Comparison: 04/28/2012  Findings: Nasogastric tube extends at least as   the   stomach, tip not seen.  Left lower lung consolidation / atelectasis has partially improved since the previous  exam.  Right lung remains clear.  Probable small left pleural effusion.  Regional bones unremarkable.  IMPRESSION:  1.  Slight improvement in the left lower lung consolidation / atelectasis, with persistent small effusion.  Original Report Authenticated By: Osa Craver, M.D.   Dg Chest Port 1 View  04/28/2012  *RADIOLOGY REPORT*  Clinical Data: Progressive leukocytosis.  PORTABLE CHEST - 1 VIEW  Comparison: Chest x-ray dated 04/23/2012 and chest CT dated 04/25/2012  Findings: There is a persistent moderate left effusion with compressive atelectasis in the left lower lobe.  Heart size and vascularity are normal.  Right lung is clear.  NG tube tip is below the diaphragm.  IMPRESSION: Progressive left pleural effusion and left lower lobe atelectasis.  Original Report Authenticated By: Gwynn Burly, M.D.   Dg Chest Port 1 View  04/23/2012  *RADIOLOGY REPORT*  Clinical Data: Endotracheal tube placed  PORTABLE CHEST - 1 VIEW  Comparison: 04/15/2012  Findings: Endotracheal tube tip 7.2 cm proximal to the carina. Interval removal of the left IJ catheter.  Hyperinflated lungs. Mild retrocardiac opacity and left hemidiaphragm elevation.  No pneumothorax.  A enteric tube descends into the abdomen, tip not visualized.  There is nonspecific metallic density projecting over the left upper quadrant of the abdomen.  No acute osseous finding.  IMPRESSION: Endotracheal and enteric tubes as above.  Retrocardiac opacity; atelectasis, aspiration, or infiltrate.  Hyperinflation.  Original Report Authenticated By: Waneta Martins, M.D.   Dg Abd 2 Views  04/20/2012  *RADIOLOGY REPORT*  Clinical Data: Postop abdominal distention, recent of abdominal surgery for small bowel obstruction and lysis of the patient with partial small bowel resection  ABDOMEN - 2 VIEW  Comparison: Portable abdomen film of 04/16/2012  Findings: Both large and small bowel gas is present which may indicate postoperative ileus.  However there  does appear be more distention of small bowel loops and a partial small bowel obstruction cannot be excluded.  Some contrast is scattered throughout the bowel.  Arterial calcifications are noted.  IMPRESSION: Distention of both large and small bowel may indicate ileus, but a partial small bowel obstruction cannot be excluded.  Original Report Authenticated By: Juline Patch, M.D.   Dg Abd Portable 1v  04/28/2012  *RADIOLOGY REPORT*  Clinical Data: Abdominal pain.  Ileus.  Progressive leukocytosis.  PORTABLE ABDOMEN - 1 VIEW  Comparison: Radiographs dated 04/20/2012  Findings: NG tube tip is in the body of the stomach.  There are no dilated loops of bowel.  Double-lumen catheter is in the right femoral vein with the tips in the inferior vena cava.  Contrast is seen in the gallbladder.  There is also scattered high density material in the bowel.  IMPRESSION: No acute abnormalities.  Original Report Authenticated By: Gwynn Burly, M.D.   Dg Abd Portable 1v  04/16/2012  *RADIOLOGY REPORT*  Clinical Data: No bowel activity since exploratory laparoscopy 2 days ago question ileus, right lower quadrant pain  PORTABLE ABDOMEN - 1 VIEW  Comparison: 04/13/2012  Findings: Nasogastric tube in stomach. Retained contrast in colon. Surgical clips in pelvis bilaterally. Scattered atherosclerotic calcifications. No bowel dilatation, bowel wall thickening, or evidence of obstruction. Bowel staple lines noted in right mid abdomen.  IMPRESSION: Nonobstructive bowel gas pattern. Resolution of small bowel distention since prior exam.  Original Report Authenticated By: Lollie Marrow, M.D.   Ir Shuntogram/ Fistulagram Left Mod Sed  05/10/2012  *RADIOLOGY REPORT*  Clinical Data: Left leg swelling.  Graft revision 2 weeks ago.  AV SHUNTOGRAM  Procedure:  The left thigh was prepped and draped in a sterile fashion.  An 18 gauge Angiocath was inserted into the AV graft. Contrast was injected.  The Angiocath was removed.  Hemostasis  was achieved with direct pressure. No complication.  Findings: The arterial anastomosis and graft are widely patent. There is moderate narrowing at the venous anastomosis.  There is associated narrowing of the external iliac vein.  Central venous structures are otherwise patent  IMPRESSION: Venous anastomotic and external iliac vein narrowing.  Otherwise patent circuit.   Original Report Authenticated By: Donavan Burnet, M.D. ( 05/10/2012 13:05:15 )     Scheduled Meds:    . antiseptic oral rinse  15 mL Mouth Rinse q12n4p  . chlorhexidine  15 mL Mouth Rinse BID  . darbepoetin (ARANESP) injection - DIALYSIS  100 mcg Intravenous Q Sat-HD  . fentaNYL  37.5 mcg Transdermal Q72H  . fluconazole (DIFLUCAN) IV  400 mg Intravenous Q T,Th,Sa-HD  . heparin  40 Units/kg Dialysis Once in dialysis  . hydrocortisone sod succinate (SOLU-CORTEF) injection  20 mg Intravenous Daily  . insulin aspart  0-15 Units Subcutaneous Q8H  . lip balm   Topical BID  . metoprolol  10 mg Intravenous Q6H  . pantoprazole (PROTONIX) IV  40 mg Intravenous Daily  . piperacillin-tazobactam (ZOSYN)  IV  2.25 g Intravenous Q8H  . sodium chloride  10-40 mL Intracatheter Q12H   Continuous Infusions:    . fat emulsion 240 mL (05/24/12 1741)  . TPN (CLINIMIX) +/- additives 70 mL/hr at 05/24/12 1740  . TPN (CLINIMIX) +/- additives 70 mL/hr at 05/25/12 1754    Principal Problem:  *SBO (small bowel obstruction) s/p EL/LOA and SBR x 2 Active Problems:  CKD (chronic kidney disease) stage V requiring chronic dialysis  Thrombocytopenia  Gout  HTN (hypertension)  Chronic use of steroids  Malnutrition, calorie  Leg graft occlusion  Acute respiratory failure with hypoxia  Sinus tachycardia  Abscess of abdominal cavity  Wound dehiscence  Bleeding  Anemia     Time spent:  30 minutes  Innocence Schlotzhauer, [facility]  [facility] Pager [phone]. If 8PM-8AM, please contact night-coverage at [email or web address], password [facility] 05/22/2012,  12:59 PM  LOS: 50 days

## 2012-05-25 NOTE — Progress Notes (Signed)
28 Days Post-Op  Subjective: Feels fine.  No drainage from midline wound for 2 days per patient.   Objective: Vital signs in last 24 hours: Temp:  [97 F (36.1 C)-98.6 F (37 C)] 97 F (36.1 C) (09/03 0818) Pulse Rate:  [66-94] 72  (09/03 1000) Resp:  [16-18] 16  (09/03 0818) BP: (98-176)/(59-84) 139/77 mmHg (09/03 1000) SpO2:  [97 %-100 %] 100 % (09/03 0506) Weight:  [109 lb 9.1 oz (49.7 kg)-115 lb 4.8 oz (52.3 kg)] 115 lb 4.8 oz (52.3 kg) (09/03 0818) Last BM Date: 05/18/12  Intake/Output from previous day: 09/02 0701 - 09/03 0700 In: 105 [IV Piggyback:100] Out: 3500  Intake/Output this shift:    General appearance: alert, cooperative and no distress GI: soft, NT, ND, midline wound with granulation tissue and some fibrinous exudate and some exposed sutures. no evidence of any fistula.  RLQ drain with minimal output as well.  Lab Results:   Basename 05/25/12 0907 05/24/12 0530  WBC 6.2 7.4  HGB 11.9* 11.9*  HCT 34.9* 35.3*  PLT 110* 117*   BMET  Basename 05/25/12 0907 05/24/12 0757  NA 128* 127*  K 3.5 3.5  CL 90* 91*  CO2 24 19  GLUCOSE 97 122*  BUN 50* 77*  CREATININE 4.85* 6.41*  CALCIUM 9.5 9.7   PT/INR No results found for this basename: LABPROT:2,INR:2 in the last 72 hours ABG No results found for this basename: PHART:2,PCO2:2,PO2:2,HCO3:2 in the last 72 hours  Studies/Results: No results found.  Anti-infectives: Anti-infectives     Start     Dose/Rate Route Frequency Ordered Stop   05/22/12 1200   fluconazole (DIFLUCAN) IVPB 400 mg        400 mg 200 mL/hr over 60 Minutes Intravenous Every T-Th-Sa (Hemodialysis) 05/21/12 1108     05/20/12 2000   fluconazole (DIFLUCAN) IVPB 400 mg  Status:  Discontinued        400 mg 200 mL/hr over 60 Minutes Intravenous Once per day on Mon Tue Thu Sat 05/19/12 1417 05/21/12 1108   05/20/12 1700   piperacillin-tazobactam (ZOSYN) IVPB 2.25 g        2.25 g 100 mL/hr over 30 Minutes Intravenous Every 8 hours  05/20/12 1634     05/17/12 1600   fluconazole (DIFLUCAN) IVPB 200 mg        200 mg 100 mL/hr over 60 Minutes Intravenous  Once 05/17/12 1129 05/17/12 1940   05/08/12 1200   fluconazole (DIFLUCAN) IVPB 400 mg  Status:  Discontinued        400 mg 200 mL/hr over 60 Minutes Intravenous Every T-Th-Sa (Hemodialysis) 05/07/12 1752 05/19/12 1417   04/30/12 1400   vancomycin (VANCOCIN) 750 mg in sodium chloride 0.9 % 150 mL IVPB  Status:  Discontinued        750 mg 150 mL/hr over 60 Minutes Intravenous  Once 04/30/12 1212 04/30/12 1507   04/29/12 2330   vancomycin (VANCOCIN) 1,500 mg in sodium chloride 0.9 % 500 mL IVPB        1,500 mg 250 mL/hr over 120 Minutes Intravenous  Once 04/29/12 2259 04/30/12 0246   04/27/12 0000   ceFAZolin (ANCEF) IVPB 1 g/50 mL premix  Status:  Discontinued     Comments: Send with pt to OR      1 g 100 mL/hr over 30 Minutes Intravenous On call 04/26/12 0927 04/26/12 0928   04/24/12 1000   micafungin (MYCAMINE) 100 mg in sodium chloride 0.9 % 100 mL IVPB  Status:  Discontinued        100 mg 100 mL/hr over 1 Hours Intravenous Daily 04/24/12 0750 05/07/12 1713   04/23/12 1600   fluconazole (DIFLUCAN) IVPB 400 mg        400 mg 200 mL/hr over 60 Minutes Intravenous  Once 04/23/12 1428 04/23/12 1800   04/22/12 2200   piperacillin-tazobactam (ZOSYN) IVPB 3.375 g  Status:  Discontinued        3.375 g 100 mL/hr over 30 Minutes Intravenous 3 times per day 04/22/12 2120 04/22/12 2137   04/22/12 2200   piperacillin-tazobactam (ZOSYN) IVPB 2.25 g  Status:  Discontinued        2.25 g 100 mL/hr over 30 Minutes Intravenous 3 times per day 04/22/12 2140 05/20/12 0748   04/22/12 2157   gentamicin (GARAMYCIN) injection  Status:  Discontinued          As needed 04/22/12 2158 04/22/12 2335   04/22/12 2156   clindamycin (CLEOCIN) injection  Status:  Discontinued          As needed 04/22/12 2157 04/22/12 2335   04/22/12 2145   piperacillin-tazobactam (ZOSYN) IVPB 3.375 g          3.375 g 12.5 mL/hr over 240 Minutes Intravenous  Once 04/22/12 2141 04/23/12 0145   04/22/12 2130   gentamicin (GARAMYCIN) 240 mg, clindamycin (CLEOCIN) 900 mg in sodium chloride irrigation 0.9 % 1,000 mL irrigation  Status:  Discontinued         Irrigation  Once 04/22/12 2119 04/23/12 0020   04/14/12 0830   ertapenem (INVANZ) 1 g in sodium chloride 0.9 % 50 mL IVPB        1 g 100 mL/hr over 30 Minutes Intravenous To Surgery 04/14/12 0816 04/14/12 0834   04/13/12 1359   ertapenem (INVANZ) 1 g in sodium chloride 0.9 % 50 mL IVPB  Status:  Discontinued        1 g 100 mL/hr over 30 Minutes Intravenous 60 min pre-op 04/13/12 1359 04/14/12 0816          Assessment/Plan: s/p Procedure(s) (LRB) with comments: THROMBECTOMY ARTERIOVENOUS GORE-TEX GRAFT (Left) - Thrombectomy of left thigh arteriovenous gortex graft REVISION OF ARTERIOVENOUS GORETEX GRAFT (Left) - using 27mmx10cm Gore-Tex Vascular Graft No evidence of EC fistula at this time.  would trial advancing diet and monitor output.  If no change in output of drain or if no return of fistula, would advance as tolerated. would change to wet to dry dressings for midline wound to debride some of the fibrinous exudate.   LOS: 53 days    Erinn Huskins DAVID 05/25/2012

## 2012-05-25 NOTE — Progress Notes (Signed)
Nutrition Follow-up/Consult  Intervention:   1. Continue TPN per pharmacy 2. Recommend trophic enteral feedings once return of bowel function - will defer to surgery 3. RD to continue to follow nutrition care plan   Assessment:   Per nephrology, pt will need HD 4 x week as long as pt remains on TPN. Per surgery, the right drain is likely to continue to drain fistula fluid and will require surgical correction. The abdominal wound has been healing well and now that drainage is minimal, patient needs to undergo upper GI and repeat CT scan in preparation for starting enteric feeds. Pt had anxiety when attempting to drink contrast and only tolerated a few ounces yesterday.   Patient continues to TPN with Clinimix 5/15 @ 70 ml/hr. Lipids (20% IVFE @ 10 ml/hr), multivitamins, and trace elements are provided 3 times weekly (MWF) due to national backorder. Provides 1398 kcal and 84 grams protein daily (based on weekly average). Meets 82% minimum estimated kcal and 100% estimated protein needs. Per TPN pharmacist - may need to consider increasing TPN rate if unable to take po's soon. Prealbumin remains WNL.  Diet Order:  NPO  Meds: Scheduled Meds:    . antiseptic oral rinse  15 mL Mouth Rinse q12n4p  . chlorhexidine  15 mL Mouth Rinse BID  . darbepoetin (ARANESP) injection - DIALYSIS  100 mcg Intravenous Q Sat-HD  . fentaNYL  37.5 mcg Transdermal Q72H  . fluconazole (DIFLUCAN) IV  400 mg Intravenous Q T,Th,Sa-HD  . heparin  40 Units/kg Dialysis Once in dialysis  . hydrocortisone sod succinate (SOLU-CORTEF) injection  20 mg Intravenous Daily  . insulin aspart  0-15 Units Subcutaneous Q8H  . lip balm   Topical BID  . metoprolol  10 mg Intravenous Q6H  . pantoprazole (PROTONIX) IV  40 mg Intravenous Daily  . piperacillin-tazobactam (ZOSYN)  IV  2.25 g Intravenous Q8H  . sodium chloride  10-40 mL Intracatheter Q12H   Continuous Infusions:    . fat emulsion 240 mL (05/24/12 1741)  . TPN  (CLINIMIX) +/- additives 70 mL/hr at 05/23/12 1803  . TPN (CLINIMIX) +/- additives 70 mL/hr at 05/24/12 1740   PRN Meds:.sodium chloride, sodium chloride, acetaminophen, acetaminophen, albuterol, heparin, heparin, lidocaine, lidocaine-prilocaine, morphine injection, nitroGLYCERIN, ondansetron (ZOFRAN) IV, ondansetron, pentafluoroprop-tetrafluoroeth, promethazine, sodium chloride  Labs:  CMP     Component Value Date/Time   NA 127* 05/24/2012 0757   K 3.5 05/24/2012 0757   CL 91* 05/24/2012 0757   CO2 19 05/24/2012 0757   GLUCOSE 122* 05/24/2012 0757   BUN 77* 05/24/2012 0757   CREATININE 6.41* 05/24/2012 0757   CALCIUM 9.7 05/24/2012 0757   PROT 5.3* 05/24/2012 0530   ALBUMIN 1.6* 05/24/2012 0530   AST 28 05/24/2012 0530   ALT 30 05/24/2012 0530   ALKPHOS 159* 05/24/2012 0530   BILITOT 1.9* 05/24/2012 0530   GFRNONAA 9* 05/24/2012 0757   GFRAA 10* 05/24/2012 0757   Phosphorus  Date/Time Value Range Status  05/24/2012  5:30 AM 2.5  2.3 - 4.6 mg/dL Final  4/54/0981  1:91 AM 2.5  2.3 - 4.6 mg/dL Final  4/78/2956  2:13 AM 2.2* 2.3 - 4.6 mg/dL Final   Potassium  Date/Time Value Range Status  05/24/2012  7:57 AM 3.5  3.5 - 5.1 mEq/L Final  05/24/2012  5:30 AM 3.6  3.5 - 5.1 mEq/L Final  05/22/2012  8:16 AM 3.2* 3.5 - 5.1 mEq/L Final   Magnesium  Date/Time Value Range Status  05/24/2012  5:30 AM  1.5  1.5 - 2.5 mg/dL Final  2/53/6644  0:34 AM 1.5  1.5 - 2.5 mg/dL Final  7/42/5956  3:87 AM 1.5  1.5 - 2.5 mg/dL Final   Prealbumin  Date/Time Value Range Status  05/24/2012  5:30 AM 24.7  17.0 - 34.0 mg/dL Final  5/64/3329  5:18 AM 23.4  17.0 - 34.0 mg/dL Final  8/41/6606  3:01 AM 26.6  17.0 - 34.0 mg/dL Final    Intake/Output Summary (Last 24 hours) at 05/25/12 0941 Last data filed at 05/25/12 0610  Gross per 24 hour  Intake    105 ml  Output   3500 ml  Net  -3395 ml  BM 8/27  Weight Status: 49.8 kg s/p HD on 9/3 - trending down 53.1 kg s/p HD on 8/27  51.5 kg s/p HD on 8/22 52.1 kg s/p HD on 8/20  54 kg s/p  HD on 8/17 63.8 kg s/p HD on 8/12 65.7 kg s/p HD on 8/8  50.5 kg s/p HD on 8/6 50 kg s/p HD on 8/5 47.8 kg s/p HD on 7/30  48.3 kg s/p HD on 7/27  Body mass index is 16.08 kg/(m^2). Underweight.  Estimated needs:  [calculated using the Kidney Disease Outcomes Quality Initiative (KDOQI) Adjustment Equation for the Underweight Patient]: 1700 - 1950 kcal, 80 - 100 grams protein daily  Nutrition Dx:  Inadequate oral intake now R/T GI distress AEB pt report. Ongoing.  Goal:  Intake to meet at least 90% of estimated needs. Not met.  Monitor:  Diet advancement, TPN adequacy, weights, labs, I/O's  Jarold Motto MS, RD, LDN Pager: [phone] After-hours pager: [phone]

## 2012-05-25 NOTE — Procedures (Signed)
Pt seen on HD.  Ap 110  Vp 210.  On 4K bath.  Hopefully will get CT scan today.  Will plan HD on TTHSAT schedule.

## 2012-05-25 NOTE — Progress Notes (Signed)
28 Days Post-Op  Subjective: Pt in HD; no new c/o at present  Objective: Vital signs in last 24 hours: Temp:  [97 F (36.1 C)-98.4 F (36.9 C)] 97 F (36.1 C) (09/03 0818) Pulse Rate:  [66-93] 93  (09/03 1200) Resp:  [16-18] 16  (09/03 0818) BP: (134-176)/(68-87) 134/86 mmHg (09/03 1200) SpO2:  [98 %-100 %] 100 % (09/03 0506) Weight:  [109 lb 9.1 oz (49.7 kg)-115 lb 4.8 oz (52.3 kg)] 115 lb 4.8 oz (52.3 kg) (09/03 0818) Last BM Date: 05/18/12  Intake/Output from previous day: 09/02 0701 - 09/03 0700 In: 105 [IV Piggyback:100] Out: 3500  Intake/Output this shift:    RLQ drain intact, output minimal amt blood-tinged fluid  Lab Results:   Basename 05/25/12 0907 05/24/12 0530  WBC 6.2 7.4  HGB 11.9* 11.9*  HCT 34.9* 35.3*  PLT 110* 117*   BMET  Basename 05/25/12 0907 05/24/12 0757  NA 128* 127*  K 3.5 3.5  CL 90* 91*  CO2 24 19  GLUCOSE 97 122*  BUN 50* 77*  CREATININE 4.85* 6.41*  CALCIUM 9.5 9.7   PT/INR No results found for this basename: LABPROT:2,INR:2 in the last 72 hours ABG No results found for this basename: PHART:2,PCO2:2,PO2:2,HCO3:2 in the last 72 hours  Studies/Results: No results found.  Anti-infectives: Anti-infectives     Start     Dose/Rate Route Frequency Ordered Stop   05/22/12 1200   fluconazole (DIFLUCAN) IVPB 400 mg        400 mg 200 mL/hr over 60 Minutes Intravenous Every T-Th-Sa (Hemodialysis) 05/21/12 1108     05/20/12 2000   fluconazole (DIFLUCAN) IVPB 400 mg  Status:  Discontinued        400 mg 200 mL/hr over 60 Minutes Intravenous Once per day on Mon Tue Thu Sat 05/19/12 1417 05/21/12 1108   05/20/12 1700   piperacillin-tazobactam (ZOSYN) IVPB 2.25 g        2.25 g 100 mL/hr over 30 Minutes Intravenous Every 8 hours 05/20/12 1634     05/17/12 1600   fluconazole (DIFLUCAN) IVPB 200 mg        200 mg 100 mL/hr over 60 Minutes Intravenous  Once 05/17/12 1129 05/17/12 1940   05/08/12 1200   fluconazole (DIFLUCAN) IVPB 400 mg   Status:  Discontinued        400 mg 200 mL/hr over 60 Minutes Intravenous Every T-Th-Sa (Hemodialysis) 05/07/12 1752 05/19/12 1417   04/30/12 1400   vancomycin (VANCOCIN) 750 mg in sodium chloride 0.9 % 150 mL IVPB  Status:  Discontinued        750 mg 150 mL/hr over 60 Minutes Intravenous  Once 04/30/12 1212 04/30/12 1507   04/29/12 2330   vancomycin (VANCOCIN) 1,500 mg in sodium chloride 0.9 % 500 mL IVPB        1,500 mg 250 mL/hr over 120 Minutes Intravenous  Once 04/29/12 2259 04/30/12 0246   04/27/12 0000   ceFAZolin (ANCEF) IVPB 1 g/50 mL premix  Status:  Discontinued     Comments: Send with pt to OR      1 g 100 mL/hr over 30 Minutes Intravenous On call 04/26/12 0927 04/26/12 0928   04/24/12 1000   micafungin (MYCAMINE) 100 mg in sodium chloride 0.9 % 100 mL IVPB  Status:  Discontinued        100 mg 100 mL/hr over 1 Hours Intravenous Daily 04/24/12 0750 05/07/12 1713   04/23/12 1600   fluconazole (DIFLUCAN) IVPB 400 mg  400 mg 200 mL/hr over 60 Minutes Intravenous  Once 04/23/12 1428 04/23/12 1800   04/22/12 2200   piperacillin-tazobactam (ZOSYN) IVPB 3.375 g  Status:  Discontinued        3.375 g 100 mL/hr over 30 Minutes Intravenous 3 times per day 04/22/12 2120 04/22/12 2137   04/22/12 2200   piperacillin-tazobactam (ZOSYN) IVPB 2.25 g  Status:  Discontinued        2.25 g 100 mL/hr over 30 Minutes Intravenous 3 times per day 04/22/12 2140 05/20/12 0748   04/22/12 2157   gentamicin (GARAMYCIN) injection  Status:  Discontinued          As needed 04/22/12 2158 04/22/12 2335   04/22/12 2156   clindamycin (CLEOCIN) injection  Status:  Discontinued          As needed 04/22/12 2157 04/22/12 2335   04/22/12 2145   piperacillin-tazobactam (ZOSYN) IVPB 3.375 g        3.375 g 12.5 mL/hr over 240 Minutes Intravenous  Once 04/22/12 2141 04/23/12 0145   04/22/12 2130   gentamicin (GARAMYCIN) 240 mg, clindamycin (CLEOCIN) 900 mg in sodium chloride irrigation 0.9 % 1,000 mL  irrigation  Status:  Discontinued         Irrigation  Once 04/22/12 2119 04/23/12 0020   04/14/12 0830   ertapenem (INVANZ) 1 g in sodium chloride 0.9 % 50 mL IVPB        1 g 100 mL/hr over 30 Minutes Intravenous To Surgery 04/14/12 0816 04/14/12 0834   04/13/12 1359   ertapenem (INVANZ) 1 g in sodium chloride 0.9 % 50 mL IVPB  Status:  Discontinued        1 g 100 mL/hr over 30 Minutes Intravenous 60 min pre-op 04/13/12 1359 04/14/12 0816          Assessment/Plan: s/p RLQ abscess drainage 8/9, repositioned 8/14; check f/u CT   LOS: 53 days    Kodah Maret,D [facility] 05/25/2012

## 2012-05-25 NOTE — Progress Notes (Signed)
Occupational Therapy Treatment Patient Details Name: [name] MRN: [id number] DOB: [birth year] Today's Date: 05/25/2012 Time: 4098-1191 OT Time Calculation (min): 20 min  OT Assessment / Plan / Recommendation Comments on Treatment Session Pt making progress, needs mod instructional cueing for correct technique during UE exercises.  Currently using level 1 theraband and tolerating exercises well.  Will look to upgrade resistance bands next session.    Follow Up Recommendations  LTACH       Equipment Recommendations  Defer to next venue       Frequency Min 1X/week   Plan Discharge plan remains appropriate    Precautions / Restrictions Precautions Precautions: Fall Precaution Comments: No right LE hip flexion secondary to cathetor. Restrictions Weight Bearing Restrictions: No   Pertinent Vitals/Pain Slight abdominal pain 2/10 with exercises    ADL  ADL Comments: Performed bilateral UE exercises with level 1 theraband from supine position.  Pt needing mod instructional cueing for techniqe.    OT Goals Arm Goals Pt Will Complete Theraband Exer: with supervision, verbal cues required/provided;Bilateral upper extremities;1 set;Level 2 Theraband;Other (comment) (As to pt's tolerance with abdominal wound.) Arm Goal: Theraband Exercises - Progress: Updated due to goal met  Visit Information  Last OT Received On: 05/25/12 Assistance Needed: +1    Subjective Data  Subjective: "I guess I can give them a try." Patient Stated Goal: To continue getting stronger.            Exercises  General Exercises - Upper Extremity Shoulder Flexion: Strengthening;Both;20 reps;Theraband;Supine Theraband Level (Shoulder Flexion): Level 1 (Yellow) Shoulder Extension: Strengthening;20 reps;Theraband;Both Theraband Level (Shoulder Extension): Level 1 (Yellow) Shoulder Horizontal ABduction: Strengthening;Both;Theraband;Supine;20 reps Theraband Level (Shoulder Horizontal Abduction): Level 1  (Yellow) Elbow Flexion: Strengthening;20 reps;Theraband;Supine Theraband Level (Elbow Flexion): Level 1 (Yellow) Elbow Extension: Strengthening;Both;20 reps;Supine;Theraband Theraband Level (Elbow Extension): Level 1 (Yellow)      End of Session OT - End of Session Activity Tolerance: Patient limited by fatigue Patient left: in bed;with call bell/phone within reach;with family/visitor present     [facility] OTR/L Pager number [phone] 05/25/2012, 3:43 PM

## 2012-05-25 NOTE — Progress Notes (Signed)
ANTIBIOTIC CONSULT NOTE - FOLLOW UP  Pharmacy Consult for Fluconazole and Zosyn Indication: abdominal abscess with fistula  Allergies  Allergen Reactions  . Allopurinol     REACTION: decreased platelets  . Aspirin     REACTION: unspecified    Patient Measurements: Height: 5\' 11"  (180.3 cm) Weight: 110 lb 3.7 oz (50 kg) IBW/kg (Calculated) : 75.3   Vital Signs: Temp: 97.4 F (36.3 C) (09/03 1248) Temp src: Oral (09/03 1248) BP: 129/81 mmHg (09/03 1230) Pulse Rate: 89  (09/03 1230) Intake/Output from previous day: 09/02 0701 - 09/03 0700 In: 105 [IV Piggyback:100] Out: 3500   Labs:  Basename 05/25/12 0907 05/24/12 0757 05/24/12 0530  WBC 6.2 -- 7.4  HGB 11.9* -- 11.9*  PLT 110* -- 117*  LABCREA -- -- --  CREATININE 4.85* 6.41* 6.32*   Assessment: 51 y.o. M on Zosyn + Fluconazole per ID recommendations for abdominal abscess. The patient continues to have a drain in place, although pt reports no output x 2 days.  Pt needs f/u CT scan to evaluate fistula and possible closure before transitioning to oral diet/meds.  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  ID recommends to continue current treatment thru 06/07/12 (noted in Dr. Joan Mayans note) and then to re-evaluate  8/1 Zosyn>> 8/3 Micafungin >> 8/16 8/17 Fluconazole >>  Goal of Therapy:  Appropriate doses for renal function and infection  Plan:  1. Continue Fluconazole 400 mg IV after HD session.   2. Continue Zoysn 2.25g IV every 8 hours 3. Will continue to follow HD schedule/duration, culture results, LOT, and ID recommendatoins  [employer], Pharm.D., BCPS Clinical Pharmacist Pager [phone] 05/25/2012 1:08 PM

## 2012-05-26 LAB — GLUCOSE, CAPILLARY: Glucose-Capillary: 119 mg/dL — ABNORMAL HIGH (ref 70–99)

## 2012-05-26 LAB — PARATHYROID HORMONE, INTACT (NO CA): PTH: 142.9 pg/mL — ABNORMAL HIGH (ref 14.0–72.0)

## 2012-05-26 MED ORDER — FAT EMULSION 20 % IV EMUL
240.0000 mL | INTRAVENOUS | Status: AC
  Administered 2012-05-26: 240 mL via INTRAVENOUS
  Filled 2012-05-26: qty 250

## 2012-05-26 MED ORDER — BOOST / RESOURCE BREEZE PO LIQD
1.0000 | ORAL | Status: DC
  Administered 2012-05-26: 1 via ORAL
  Administered 2012-05-29: 0.9875 via ORAL
  Administered 2012-05-30: 1 via ORAL

## 2012-05-26 MED ORDER — ZINC TRACE METAL 1 MG/ML IV SOLN
INTRAVENOUS | Status: AC
  Administered 2012-05-26: 18:00:00 via INTRAVENOUS
  Filled 2012-05-26: qty 2000

## 2012-05-26 NOTE — Progress Notes (Signed)
Nutrition Follow-up/Consult  Intervention:   1. Continue TPN per pharmacy 2. Resource Breeze po daily, each supplement provides 250 kcal and 9 grams of protein. Provided emotional support and encouraged pt to consume liquids as able throughout the day. 3. Recommend trophic enteral feedings once able - will defer to surgery 4. RD to continue to follow nutrition care plan   Assessment:   Per chart, pt was unable to tolerate contrast for CT so he was started on clears per surgery on 9/3. Pt tolerated 2 oz of fluid yesterday.  Discussed oral intake with patient. He has anxiety about developing GI distress if he consumes too much liquid at once. Per his report, he has only had a few bites of jello. Pt is trying to increase his oral intake very slowly.   Patient continues to TPN with Clinimix 5/15 @ 70 ml/hr. Lipids (20% IVFE @ 10 ml/hr), multivitamins, and trace elements are provided 3 times weekly (MWF) due to national backorder. Provides 1398 kcal and 84 grams protein daily (based on weekly average). Meets 82% minimum estimated kcal and 100% estimated protein needs. Per TPN pharmacist - may need to consider increasing TPN rate if unable to take po's soon. Prealbumin remains WNL.  Diet Order:  Clear Liquids  Meds: Scheduled Meds:    . antiseptic oral rinse  15 mL Mouth Rinse q12n4p  . chlorhexidine  15 mL Mouth Rinse BID  . darbepoetin (ARANESP) injection - DIALYSIS  100 mcg Intravenous Q Sat-HD  . fentaNYL  37.5 mcg Transdermal Q72H  . fluconazole (DIFLUCAN) IV  400 mg Intravenous Q T,Th,Sa-HD  . heparin  40 Units/kg Dialysis Once in dialysis  . hydrocortisone sod succinate (SOLU-CORTEF) injection  20 mg Intravenous Daily  . insulin aspart  0-15 Units Subcutaneous Q8H  . labetalol  10 mg Intravenous Q6H  . lip balm   Topical BID  . pantoprazole (PROTONIX) IV  40 mg Intravenous Daily  . piperacillin-tazobactam (ZOSYN)  IV  2.25 g Intravenous Q8H  . simethicone  80 mg Oral QID  . sodium  chloride  10-40 mL Intracatheter Q12H  . DISCONTD: metoprolol  10 mg Intravenous Q6H   Continuous Infusions:    . fat emulsion 240 mL (05/24/12 1741)  . TPN (CLINIMIX) +/- additives 70 mL/hr at 05/24/12 1740  . TPN (CLINIMIX) +/- additives 70 mL/hr at 05/25/12 1754   PRN Meds:.sodium chloride, sodium chloride, acetaminophen, acetaminophen, albuterol, heparin, heparin, lidocaine, lidocaine-prilocaine, morphine injection, nitroGLYCERIN, ondansetron (ZOFRAN) IV, ondansetron, pentafluoroprop-tetrafluoroeth, promethazine, sodium chloride  Labs:  CMP     Component Value Date/Time   NA 128* 05/25/2012 0907   K 3.5 05/25/2012 0907   CL 90* 05/25/2012 0907   CO2 24 05/25/2012 0907   GLUCOSE 97 05/25/2012 0907   BUN 50* 05/25/2012 0907   CREATININE 4.85* 05/25/2012 0907   CALCIUM 9.5 05/25/2012 0907   PROT 5.2* 05/25/2012 0907   ALBUMIN 1.5* 05/25/2012 0907   AST 36 05/25/2012 0907   ALT 30 05/25/2012 0907   ALKPHOS 161* 05/25/2012 0907   BILITOT 1.6* 05/25/2012 0907   GFRNONAA 13* 05/25/2012 0907   GFRAA 15* 05/25/2012 0907   Phosphorus  Date/Time Value Range Status  05/24/2012  5:30 AM 2.5  2.3 - 4.6 mg/dL Final  1/61/0960  4:54 AM 2.5  2.3 - 4.6 mg/dL Final  0/98/1191  4:78 AM 2.2* 2.3 - 4.6 mg/dL Final   Potassium  Date/Time Value Range Status  05/25/2012  9:07 AM 3.5  3.5 - 5.1 mEq/L Final  05/24/2012  7:57 AM 3.5  3.5 - 5.1 mEq/L Final  05/24/2012  5:30 AM 3.6  3.5 - 5.1 mEq/L Final   Magnesium  Date/Time Value Range Status  05/24/2012  5:30 AM 1.5  1.5 - 2.5 mg/dL Final  1/61/0960  4:54 AM 1.5  1.5 - 2.5 mg/dL Final  0/98/1191  4:78 AM 1.5  1.5 - 2.5 mg/dL Final   Prealbumin  Date/Time Value Range Status  05/24/2012  5:30 AM 24.7  17.0 - 34.0 mg/dL Final  2/95/6213  0:86 AM 23.4  17.0 - 34.0 mg/dL Final  5/78/4696  2:95 AM 26.6  17.0 - 34.0 mg/dL Final    Intake/Output Summary (Last 24 hours) at 05/26/12 0852 Last data filed at 05/25/12 2029  Gross per 24 hour  Intake   1057 ml  Output   2600 ml  Net   -1543 ml  BM 8/27  Weight Status: 49.6 kg s/p HD on 9/3 - trending down 49.8 kg s/p HD on 9/3  53.1 kg s/p HD on 8/27  51.5 kg s/p HD on 8/22 52.1 kg s/p HD on 8/20  54 kg s/p HD on 8/17 63.8 kg s/p HD on 8/12 65.7 kg s/p HD on 8/8  50.5 kg s/p HD on 8/6 50 kg s/p HD on 8/5 47.8 kg s/p HD on 7/30  48.3 kg s/p HD on 7/27  Body mass index is 15.26 kg/(m^2). Underweight.  Estimated needs:  [calculated using the Kidney Disease Outcomes Quality Initiative (KDOQI) Adjustment Equation for the Underweight Patient]: 1700 - 1950 kcal, 80 - 100 grams protein daily  Nutrition Dx:  Inadequate oral intake now R/T GI distress AEB pt report. Ongoing.  Goal:  Intake to meet at least 90% of estimated needs. Not met.  Monitor:  Diet advancement, TPN adequacy, weights, labs, I/O's  Jarold Motto MS, RD, LDN Pager: [phone] After-hours pager: [phone]

## 2012-05-26 NOTE — Progress Notes (Signed)
Patient ID: [name], male   DOB: 1961-03-05, 51 y.o.   MRN: [id number]  [facility] PROGRESS NOTE  DAMEIN GAUNCE WUJ:[id number] DOB: 04-30-1961 DOA: 04/02/2012 PCP: Trevor Iha, MD  Brief narrative: This is a 51 year old male with past medical history of ESRD (MWF), s/p renal transplant about 17 years ago, prostatectomy in 2012, admitted 04/02/2012 with abdominal pain, nausea and vomiting. Work up done indicated SBO and pt has required Ex Lap for lysis of adhesions. He subsequently had an anastomotic leak with an abscess which has necessitated an omental patching of the anastomosis and percutaneous drain placement. The patient had an elevation of his white blood cell count and CT of the abdomen revealed an enlarging abscess an enteric fistula was obstructed drainage tube necessitated repositioning of the percutaneous drain a percutaneous drain. Presently the patient has an open abdominal wound which is connected with the abscess cavity and is being managed by general surgery and wound ostomy care.   Principal Problem: SBO (small bowel obstruction) s/p exploratory laparotomy and lysis of adhesions and partial small bowel x 2 on 7/24. Patient had subsequent anastomotic leak- omental patch of anastomosis on 8/2. On 8/8, CT abdomen and pelvis demonstrated several fluid collections consistent with abscess, largest measuring 16.5 x 13.7 x 6.4 cm. On 8/9, he underwent percutaneous drainage of largest fluid collection. Repeat CT done for increasing WBC count on 8/13 revealed enlarging abscess and an enteric fistula with obstructed drainage tube. The drain was repositioned on 8/14. Since that time, he has had an open abdominal wound which connects to an abscess cavity, which has been slowly closing.  - General surgery following - Add simethicone per patient request  - Zofran and phenergan still ordered  - Appreciate surgery recommendations   Peritonitis secondary to anastomotic  dehiscence/intra-abdominal abscess - will call ID for further recommendations of length of ABX treatment  Ileus, resolved.  - Patient has excellent bowel sounds and is passing gas.   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  - Patient should not bend the right hip per interventional radiology.   Chronic nonocclusive left lower extremity DVT.  - Diagnosed via Doppler 04/30/2012 and follow up on 8/21 showed no evidence of DVT, superficial thrombosis or baker's cyst.  - Resolved.   CKD (chronic kidney disease) stage V requiring chronic dialysis  - Dialysis per nephrology   HTN (hypertension),  - remains elevated but stable - will continue to monitor vitals per floor protocol  Anemia due to chronic disease - Stable - Has received a total of 4 units of packed red blood cells this admission  - Cont aranesp and ferric gluconate   Chronic use of steroids (gout).  - Patient had been on pred 5mg  daily. Weaned IV hydrocortisone. Currently at 25 mg daily.  - Wean to 20mg  daily 9/2 (equivalent to pred 5) and continue until tolerating PO and try to wean off if tolerated.   Leg graft occlusion  - s/p thrombectomy 7/25  - unfortunately re clotted and undergone an additional thrombectomy procedure with revision of arterial and graft on 04/27/2012.   Severe protein calorie malnutrition  - Patient cachectic at presentation  -  Parenteral nutrition managed by surgery and pharmacy.   Code Status: Full  Family Communication: Pt at bedside over 60 minutes and sister over the phone Disposition Plan: Pending LTAC bed vs SNF vs  home   Consultants:  Surgery  Nephrology  Antibiotics:  Zosyn 05/20/2012 -->  HPI/Subjective: No events overnight.   Objective: Filed Vitals:   05/25/12 2021 05/26/12 0537 05/26/12 1005 05/26/12 1320  BP: 158/81 145/66 166/85 168/71  Pulse: 73 68  87  Temp: 98.5 F (36.9 C) 98.3 F (36.8 C)  98.7 F (37.1 C)  TempSrc: Oral Oral  Oral  Resp: 18 18  18   Height:      Weight: 49.624 kg (109 lb 6.4 oz)     SpO2: 99% 100%  100%    Intake/Output Summary (Last 24 hours) at 05/26/12 1600 Last data filed at 05/26/12 1500  Gross per 24 hour  Intake 2889.5 ml  Output      0 ml  Net 2889.5 ml    Exam:   General:  Pt is alert, follows commands appropriately, not in acute distress, cachectic  Cardiovascular: Regular rate and rhythm, S1/S2, no murmurs, no rubs, no gallops  Respiratory: Clear to auscultation bilaterally, no wheezing, no crackles, no rhonchi  Abdomen: Soft, non tender, non distended, bowel sounds present, no guarding  Extremities: No edema, pulses DP and PT palpable bilaterally  Neuro: Grossly nonfocal  Data Reviewed: Basic Metabolic Panel:  Lab 05/25/12 2952 05/24/12 0757 05/24/12 0530 05/22/12 0816 05/21/12 0631 05/20/12 0600  NA 128* 127* 128* 127* 133* --  K 3.5 3.5 3.6 3.2* 3.3* --  CL 90* 91* 91* 92* 96 --  CO2 24 19 20 20 24  --  GLUCOSE 97 122* 105* 94 109* --  BUN 50* 77* 74* 70* 40* --  CREATININE 4.85* 6.41* 6.32* 5.91* 4.03* --  CALCIUM 9.5 9.7 10.0 9.7 9.4 --  MG -- -- 1.5 -- 1.5 1.5  PHOS -- -- 2.5 2.5 2.2* 1.8*   Liver Function Tests:  Lab 05/25/12 0907 05/24/12 0530 05/22/12 0816 05/20/12 0600  AST 36 28 -- 37  ALT 30 30 -- 32  ALKPHOS 161* 159* -- 156*  BILITOT 1.6* 1.9* -- 1.8*  PROT 5.2* 5.3* -- 5.2*  ALBUMIN 1.5* 1.6* 1.5* 1.5*   CBC:  Lab 05/25/12 0907 05/24/12 0530 05/22/12 0816 05/20/12 0600  WBC 6.2 7.4 9.2 9.0  NEUTROABS -- 4.9 -- --  HGB 11.9* 11.9* 11.3* 10.7*  HCT 34.9* 35.3* 33.3* 32.4*  MCV 81.5 82.7  81.2 84.2  PLT 110* 117* 137* 132*   CBG:  Lab 05/26/12 0821 05/25/12 2334 05/25/12 1632 05/25/12 1400 05/24/12 2325  GLUCAP 104* 124* 115* 102* 135*   Scheduled Meds:   . antiseptic oral rinse  15 mL Mouth Rinse q12n4p  . chlorhexidine  15 mL Mouth Rinse BID  . darbepoetin (ARANESP) injection - DIALYSIS  100 mcg Intravenous Q Sat-HD  . feeding supplement  1 Container Oral Q24H  . fentaNYL  37.5 mcg Transdermal Q72H  . fluconazole (DIFLUCAN) IV  400 mg Intravenous Q T,Th,Sa-HD  . heparin  40 Units/kg Dialysis Once in dialysis  . hydrocortisone sod succinate (SOLU-CORTEF) injection  20 mg Intravenous Daily  . insulin aspart  0-15 Units Subcutaneous Q8H  . labetalol  10 mg Intravenous Q6H  . lip balm   Topical BID  . pantoprazole (PROTONIX) IV  40 mg Intravenous Daily  . piperacillin-tazobactam (ZOSYN)  IV  2.25 g Intravenous Q8H  . simethicone  80 mg Oral QID  . sodium chloride  10-40 mL Intracatheter Q12H  . DISCONTD: metoprolol  10 mg Intravenous Q6H   Continuous Infusions:   . fat emulsion 240 mL (05/24/12 1741)  . fat emulsion    . TPN (CLINIMIX) +/- additives 70 mL/hr at 05/24/12 1740  . TPN (CLINIMIX) +/- additives 70 mL/hr at 05/25/12 1754  . TPN [facility]) +/- additives       Debbora Presto, MD  [facility] Pager [phone]  If 7PM-7AM, please contact night-coverage [email or web address] Password [email or web address] 05/26/2012, 4:00 PM   LOS: 54 days

## 2012-05-26 NOTE — Progress Notes (Signed)
Subjective  Hd yesterday, starting clear liquids  Today per surgery Objective Vital signs in last 24 hours: Filed Vitals:   05/25/12 1351 05/25/12 1630 05/25/12 2021 05/26/12 0537  BP: 150/81 150/68 158/81 145/66  Pulse: 85 72 73 68  Temp: 98.2 F (36.8 C) 98.1 F (36.7 C) 98.5 F (36.9 C) 98.3 F (36.8 C)  TempSrc: Oral  Oral Oral  Resp: 18 15 18 18   Height:      Weight:   49.624 kg (109 lb 6.4 oz)   SpO2: 96% 100% 99% 100%   Weight change: -1.6 kg (-3 lb 8.4 oz)  Intake/Output Summary (Last 24 hours) at 05/26/12 0930 Last data filed at 05/26/12 0900  Gross per 24 hour  Intake   1062 ml  Output   2600 ml  Net  -1538 ml   Labs: Basic Metabolic Panel:  Lab 05/25/12 1610 05/24/12 0757 05/24/12 0530 05/22/12 0816 05/21/12 0631  NA 128* 127* 128* -- --  K 3.5 3.5 3.6 -- --  CL 90* 91* 91* -- --  CO2 24 19 20  -- --  GLUCOSE 97 122* 105* -- --  BUN 50* 77* 74* -- --  CREATININE 4.85* 6.41* 6.32* -- --  CALCIUM 9.5 9.7 10.0 -- --  ALB -- -- -- -- --  PHOS -- -- 2.5 2.5 2.2*   Liver Function Tests:  Lab 05/25/12 0907 05/24/12 0530 05/22/12 0816 05/20/12 0600  AST 36 28 -- 37  ALT 30 30 -- 32  ALKPHOS 161* 159* -- 156*  BILITOT 1.6* 1.9* -- 1.8*  PROT 5.2* 5.3* -- 5.2*  ALBUMIN 1.5* 1.6* 1.5* --   No results found for this basename: LIPASE:3,AMYLASE:3 in the last 168 hours No results found for this basename: AMMONIA:3 in the last 168 hours CBC:  Lab 05/25/12 0907 05/24/12 0530 05/22/12 0816 05/20/12 0600  WBC 6.2 7.4 9.2 --  NEUTROABS -- 4.9 -- --  HGB 11.9* 11.9* 11.3* --  HCT 34.9* 35.3* 33.3* --  MCV 81.5 82.7 81.2 84.2  PLT 110* 117* 137* --   Cardiac Enzymes: No results found for this basename: CKTOTAL:5,CKMB:5,CKMBINDEX:5,TROPONINI:5 in the last 168 hours CBG:  Lab 05/26/12 0821 05/25/12 2334 05/25/12 1632 05/25/12 1400 05/24/12 2325  GLUCAP 104* 124* 115* 102* 135*    Iron Studies: No results found for this basename: IRON,TIBC,TRANSFERRIN,FERRITIN  in the last 72 hours Studies/Results: No results found. Medications:    . fat emulsion 240 mL (05/24/12 1741)  . TPN (CLINIMIX) +/- additives 70 mL/hr at 05/24/12 1740  . TPN (CLINIMIX) +/- additives 70 mL/hr at 05/25/12 1754      . antiseptic oral rinse  15 mL Mouth Rinse q12n4p  . chlorhexidine  15 mL Mouth Rinse BID  . darbepoetin (ARANESP) injection - DIALYSIS  100 mcg Intravenous Q Sat-HD  . feeding supplement  1 Container Oral Q24H  . fentaNYL  37.5 mcg Transdermal Q72H  . fluconazole (DIFLUCAN) IV  400 mg Intravenous Q T,Th,Sa-HD  . heparin  40 Units/kg Dialysis Once in dialysis  . hydrocortisone sod succinate (SOLU-CORTEF) injection  20 mg Intravenous Daily  . insulin aspart  0-15 Units Subcutaneous Q8H  . labetalol  10 mg Intravenous Q6H  . lip balm   Topical BID  . pantoprazole (PROTONIX) IV  40 mg Intravenous Daily  . piperacillin-tazobactam (ZOSYN)  IV  2.25 g Intravenous Q8H  . simethicone  80 mg Oral QID  . sodium chloride  10-40 mL Intracatheter Q12H  . DISCONTD: metoprolol  10 mg Intravenous Q6H   I  have reviewed scheduled and prn medications.  Physical Exam: General: Alert, NAD Heart: RRR, 1/6 sem lsb Lungs: CTA Abdomen: bs pos. , surgical dressing intact, nontender, soft Extremities: Dialysis Access: no pedal edema, pos. Bruit  Left fem avggg   Assessment/Plan:  1. SBO sp surg/dehisc/abscess w controlled EC fistula and pelvic drain=CCS starting cl, could not drink liquid for contrast ct yesterday/ on zoysn and still on diflucan, CCS managing, requiring TNA 2. ESRD - no hep HD per surg request; will try to change to TIW HD, TTS schedule for now - Hd  - and use 4 K bath. 3. Anemia - Hgb 11.3 trending up nicely hgb on aranesp 200 no iron; will decrease the dose to 100 mcq today 4. 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 HTN/volume - max uf  each HD;  yesterday 3300cc  uf, IV metoprolol 5 mg q 6 hours 6 . Malnutrition = On TNA Alb 1.5, but prealbumin 23.4 8/26  7. Gout - stable No pain on Solu-Cortef   [name], PA-C [facility] Beeper [phone] 05/26/2012,9:30 AM  LOS: 54 days  I have seen and examined this patient and agree with plan per [name].  He is eating clear liquids and tolerating so far.  I discussed placement of NG tube to give the oral contrast for CT scan and he is agreeable to that if still needed.  Plan HD in AM.. Jilberto Vanderwall T,MD 05/26/2012 9:53 AM

## 2012-05-26 NOTE — Progress Notes (Signed)
Patient ID: [name], male   DOB: Dec 31, 1960, 51 y.o.   MRN: [id number] 29 Days Post-Op  Subjective: No complaints, no increase in drainage from midline wound, denies nausea or vomiting with clears but not able to tolerate much due to feeling full and having lots of gas.  Had very small streak of stool   Objective: Vital signs in last 24 hours: Temp:  [97.4 F (36.3 C)-98.5 F (36.9 C)] 98.3 F (36.8 C) (09/04 0537) Pulse Rate:  [67-93] 68  (09/04 0537) Resp:  [15-18] 18  (09/04 0537) BP: (129-159)/(66-87) 145/66 mmHg (09/04 0537) SpO2:  [96 %-100 %] 100 % (09/04 0537) Weight:  [109 lb 6.4 oz (49.624 kg)-110 lb 3.7 oz (50 kg)] 109 lb 6.4 oz (49.624 kg) (09/03 2021) Last BM Date: 05/18/12  Intake/Output from previous day: 09/03 0701 - 09/04 0700 In: 1057 [P.O.:60; IV Piggyback:250; TPN:747] Out: 2600  Intake/Output this shift:    General appearance: alert, cooperative and no distress GI: soft, NT, ND, midline wound with granulation tissue and some fibrinous exudate, exposed sutures and no drainage. RLQ drain with minimal output  Lab Results:   Basename 05/25/12 0907 05/24/12 0530  WBC 6.2 7.4  HGB 11.9* 11.9*  HCT 34.9* 35.3*  PLT 110* 117*   BMET  Basename 05/25/12 0907 05/24/12 0757  NA 128* 127*  K 3.5 3.5  CL 90* 91*  CO2 24 19  GLUCOSE 97 122*  BUN 50* 77*  CREATININE 4.85* 6.41*  CALCIUM 9.5 9.7   PT/INR No results found for this basename: LABPROT:2,INR:2 in the last 72 hours ABG No results found for this basename: PHART:2,PCO2:2,PO2:2,HCO3:2 in the last 72 hours  Studies/Results: No results found.  Anti-infectives: Anti-infectives     Start     Dose/Rate Route Frequency Ordered Stop   05/22/12 1200   fluconazole (DIFLUCAN) IVPB 400 mg        400 mg 200 mL/hr over 60 Minutes Intravenous Every T-Th-Sa (Hemodialysis) 05/21/12 1108     05/20/12 2000   fluconazole (DIFLUCAN) IVPB 400 mg  Status:  Discontinued        400 mg 200 mL/hr over 60  Minutes Intravenous Once per day on Mon Tue Thu Sat 05/19/12 1417 05/21/12 1108   05/20/12 1700   piperacillin-tazobactam (ZOSYN) IVPB 2.25 g        2.25 g 100 mL/hr over 30 Minutes Intravenous Every 8 hours 05/20/12 1634     05/17/12 1600   fluconazole (DIFLUCAN) IVPB 200 mg        200 mg 100 mL/hr over 60 Minutes Intravenous  Once 05/17/12 1129 05/17/12 1940   05/08/12 1200   fluconazole (DIFLUCAN) IVPB 400 mg  Status:  Discontinued        400 mg 200 mL/hr over 60 Minutes Intravenous Every T-Th-Sa (Hemodialysis) 05/07/12 1752 05/19/12 1417   04/30/12 1400   vancomycin (VANCOCIN) 750 mg in sodium chloride 0.9 % 150 mL IVPB  Status:  Discontinued        750 mg 150 mL/hr over 60 Minutes Intravenous  Once 04/30/12 1212 04/30/12 1507   04/29/12 2330   vancomycin (VANCOCIN) 1,500 mg in sodium chloride 0.9 % 500 mL IVPB        1,500 mg 250 mL/hr over 120 Minutes Intravenous  Once 04/29/12 2259 04/30/12 0246   04/27/12 0000   ceFAZolin (ANCEF) IVPB 1 g/50 mL premix  Status:  Discontinued     Comments: Send with pt to OR  1 g 100 mL/hr over 30 Minutes Intravenous On call 04/26/12 0927 04/26/12 0928   04/24/12 1000   micafungin (MYCAMINE) 100 mg in sodium chloride 0.9 % 100 mL IVPB  Status:  Discontinued        100 mg 100 mL/hr over 1 Hours Intravenous Daily 04/24/12 0750 05/07/12 1713   04/23/12 1600   fluconazole (DIFLUCAN) IVPB 400 mg        400 mg 200 mL/hr over 60 Minutes Intravenous  Once 04/23/12 1428 04/23/12 1800   04/22/12 2200   piperacillin-tazobactam (ZOSYN) IVPB 3.375 g  Status:  Discontinued        3.375 g 100 mL/hr over 30 Minutes Intravenous 3 times per day 04/22/12 2120 04/22/12 2137   04/22/12 2200   piperacillin-tazobactam (ZOSYN) IVPB 2.25 g  Status:  Discontinued        2.25 g 100 mL/hr over 30 Minutes Intravenous 3 times per day 04/22/12 2140 05/20/12 0748   04/22/12 2157   gentamicin (GARAMYCIN) injection  Status:  Discontinued          As needed  04/22/12 2158 04/22/12 2335   04/22/12 2156   clindamycin (CLEOCIN) injection  Status:  Discontinued          As needed 04/22/12 2157 04/22/12 2335   04/22/12 2145   piperacillin-tazobactam (ZOSYN) IVPB 3.375 g        3.375 g 12.5 mL/hr over 240 Minutes Intravenous  Once 04/22/12 2141 04/23/12 0145   04/22/12 2130   gentamicin (GARAMYCIN) 240 mg, clindamycin (CLEOCIN) 900 mg in sodium chloride irrigation 0.9 % 1,000 mL irrigation  Status:  Discontinued         Irrigation  Once 04/22/12 2119 04/23/12 0020   04/14/12 0830   ertapenem (INVANZ) 1 g in sodium chloride 0.9 % 50 mL IVPB        1 g 100 mL/hr over 30 Minutes Intravenous To Surgery 04/14/12 0816 04/14/12 0834   04/13/12 1359   ertapenem (INVANZ) 1 g in sodium chloride 0.9 % 50 mL IVPB  Status:  Discontinued        1 g 100 mL/hr over 30 Minutes Intravenous 60 min pre-op 04/13/12 1359 04/14/12 0816          Assessment/Plan: s/p Procedure(s) (LRB) with comments: THROMBECTOMY ARTERIOVENOUS GORE-TEX GRAFT (Left) - Thrombectomy of left thigh arteriovenous gortex graft REVISION OF ARTERIOVENOUS GORETEX GRAFT (Left) - using 80mmx10cm Gore-Tex Vascular Graft SBO s/p el/loa and SBR x2 with EC fistula and leak: EC fistula on midline seems to be healed and has not starting draining again since starting clears.  Keep on clears for now.  Other drain output is minimal and unchanged.  Mobilize ad lib per surgery.  Cont TNA.    LOS: 54 days    Mora, Stephen 05/26/2012 No drainage from midline.  RLQ drain minimal as well.  Continue to advance diet as tolerated and monitor drain output.  Wet to dry for midline

## 2012-05-26 NOTE — Consult Note (Signed)
Wound care follow-up:  Abd wound cont to slowly decrease in size.  Upper wound pink dry scar tissue.  Midline abd moist and red, no odor, minimal tan drainage.  Continue Aquacel 3 times a week to promote healing.  CCS following for assessmentt and plan of care. No further s/s of fistula drainage. Will not plan to follow further unless re-consulted.  [address], RN, MSN, [employer]  [phone]

## 2012-05-26 NOTE — Plan of Care (Signed)
Problem: Phase II Progression Outcomes Goal: Return of bowel function (flatus, BM) IF ABDOMINAL SURGERY:  Outcome: Completed/Met Date Met:  05/26/12 Pt had bowel movement today. Moderate amount. Jobe Igo A 05/26/2012

## 2012-05-26 NOTE — Progress Notes (Signed)
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 Allopurinol     REACTION: decreased platelets  . Aspirin     REACTION: unspecified    Patient Measurements: Height: 5\' 11"  (180.3 cm) Weight: 109 lb 6.4 oz (49.624 kg) IBW/kg (Calculated) : 75.3    Vital Signs: Temp: 98.3 F (36.8 C) (09/04 0537) Temp src: Oral (09/04 0537) BP: 166/85 mmHg (09/04 1005) Pulse Rate: 68  (09/04 0537) Intake/Output from previous day: 09/03 0701 - 09/04 0700 In: 1057 [P.O.:60; IV Piggyback:250; TPN:747] Out: 2600  Intake/Output from this shift: Total I/O In: 5 [Other:5] Out: -   Labs:  [name] 05/25/12 0907 05/24/12 0530  WBC 6.2 7.4  HGB 11.9* 11.9*  HCT 34.9* 35.3*  PLT 110* 117*  APTT -- --  INR -- --     [facility] 05/25/12 0907 05/24/12 0757 05/24/12 0530  NA 128* 127* 128*  K 3.5 3.5 3.6  CL 90* 91* 91*  CO2 24 19 20   GLUCOSE 97 122* 105*  BUN 50* 77* 74*  CREATININE 4.85* 6.41* 6.32*  LABCREA -- -- --  CREAT24HRUR -- -- --  CALCIUM 9.5 9.7 10.0  MG -- -- 1.5  PHOS -- -- 2.5  PROT 5.2* -- 5.3*  ALBUMIN 1.5* -- 1.6*  AST 36 -- 28  ALT 30 -- 30  ALKPHOS 161* -- 159*  BILITOT 1.6* -- 1.9*  BILIDIR -- -- --  IBILI -- -- --  PREALBUMIN -- -- 24.7  TRIG -- -- 128  CHOLHDL -- -- --  CHOL -- -- 158   Estimated Creatinine Clearance: 12.5 ml/min (by C-G formula based on Cr of 4.85).    Basename 05/26/12 0821 05/25/12 2334 05/25/12 1632  GLUCAP 104* 124* 115*   Insulin Requirements in the past 24 hours:  0 units Novolog SSI and 15 units regular insulin in TNA  Nutritional Goals:  1700-1950 kCal, 80-100 grams of protein per day   Current Nutrition:  Clinimix 5/15 at 70 ml/hr with 20% lipids at 10 ml/hr MWF provides an average of 1398 kcal and 84g protein per day  Admit: Admitted 7/13 with abd pain, weight loss, SBO. S/p LOA, SBR 7/24 and repeat LOA, SBR with anasomotic leak repair  8/1, thrombectomy with AVG revision 7/25 and repeat thrombectomy 8/6. 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  GI: Tolerating TPN. Pt unable to tolerate CT contrast 9/3 so pt started on clears per surgery to help (tolerated 2 oz) -plans for CT abd to assess RLQ site as drain output is minimal does not look enteric. Midline healing w no new drainage after starting clears. Pt's wt appears to be relatively stable. Gravity drain bag due to controlled SB. Pt anxious about increasing PO intake per RD note. ? NG tube placement for CT contrast if needed, renal MD discussed this w pt  Endo: CBGs well controlled. CBGs controlled on minimal SSI. On solu-cortef 20 daily (pred 5 daily PTA)  Lytes: 9/3: Na low at 128 dt/ overload, K stable at 3.5. Hypercalcemia continues (corrected Ca 11.7, Ca x PO4 remains < 55). No new bmet. Renal MD replacing lytes.  Renal: ESRD with HD typically TTS. HD is currently being administered prn d/t volume issues (removed 3.3L 9/3). Currently 4x/week HD MTTS while pt requiring TPN - Noted plan to try and cut back to T/T/S. If TPN continues, CSW trying to find LTAC HD bed that can accommodate  HD 4x/wk. On Aranesp qSat- dose decreased 9/4.    Cards: BP variable. (continues with hypotension during HD). On scheduled lopressor and prn hydralazine, prn labetolol added 9/3.   Hepatobil: LFTs WNL 8/29 except alk phos/Tbili elevated. Prealbumin remains at goal. Trig WNL 128  Neuro: GCS 20, no c/o pain - on fentanyl patch, prn pain meds.   ID: Fluconazole + Zosyn for peritonitis/abd abscess -- Noted primary team plans to cont abx thro 9/16, then re-CT abd and consult with ID for further recs on abx duration. Afeb, WBC WNL. Abd abscess culture: candida tropicalis.   Zosyn 8.1>>  Diflucan 8.16>> (s/p 14 d mycamine)  Heme/Onc: Hgb up to 11.9 on Aranesp q Saturday.  Best Practices: Mouthcare, PPI po  Plan:  1. Continue Clinimix 5/15 (no lytes) @ 70 ml/hr to avoid  exacerbating volume status. MD/RD aware TPN not meeting kcal goals with Clinimix alone. Pt appears to be maintaining weight and prealbumin on current regimen. 2. Will defer electrolyte replacement to renal MD 3. Provide available trace elements, MVI and lipids @10cc /hr on MWF only d/t national backorder 4. F/up PO intake to assess future TPN needs 5. Mag and Phos in AM 6. Will f/up CT results in AM  Thank you for the consult.  Tomi Bamberger, PharmD Clinical Pharmacist Pager: [phone] Pharmacy: [phone] 05/26/2012 10:58 AM

## 2012-05-27 ENCOUNTER — Inpatient Hospital Stay (HOSPITAL_COMMUNITY): Payer: Medicare Other

## 2012-05-27 LAB — RENAL FUNCTION PANEL
Albumin: 1.6 g/dL — ABNORMAL LOW (ref 3.5–5.2)
CO2: 19 mEq/L (ref 19–32)
Chloride: 91 mEq/L — ABNORMAL LOW (ref 96–112)
Creatinine, Ser: 5.35 mg/dL — ABNORMAL HIGH (ref 0.50–1.35)
GFR calc Af Amer: 13 mL/min — ABNORMAL LOW (ref >90)
GFR calc non Af Amer: 11 mL/min — ABNORMAL LOW (ref >90)
Potassium: 3.4 mEq/L — ABNORMAL LOW (ref 3.5–5.1)

## 2012-05-27 LAB — CBC
MCV: 81.7 fL (ref 78.0–100.0)
Platelets: 149 10*3/uL — ABNORMAL LOW (ref 150–400)
RBC: 4.47 MIL/uL (ref 4.22–5.81)
WBC: 7.2 10*3/uL (ref 4.0–10.5)

## 2012-05-27 LAB — GLUCOSE, CAPILLARY
Glucose-Capillary: 116 mg/dL — ABNORMAL HIGH (ref 70–99)
Glucose-Capillary: 156 mg/dL — ABNORMAL HIGH (ref 70–99)

## 2012-05-27 LAB — MAGNESIUM: Magnesium: 1.5 mg/dL (ref 1.5–2.5)

## 2012-05-27 LAB — HEPATIC FUNCTION PANEL
AST: 42 U/L — ABNORMAL HIGH (ref 0–37)
Albumin: 1.7 g/dL — ABNORMAL LOW (ref 3.5–5.2)
Total Bilirubin: 1.6 mg/dL — ABNORMAL HIGH (ref 0.3–1.2)
Total Protein: 5.6 g/dL — ABNORMAL LOW (ref 6.0–8.3)

## 2012-05-27 MED ORDER — INSULIN REGULAR HUMAN 100 UNIT/ML IJ SOLN
INTRAVENOUS | Status: AC
  Administered 2012-05-27: 17:00:00 via INTRAVENOUS
  Filled 2012-05-27: qty 2000

## 2012-05-27 MED ORDER — MORPHINE SULFATE 2 MG/ML IJ SOLN
INTRAMUSCULAR | Status: AC
  Administered 2012-05-27: 2 mg via INTRAVENOUS
  Filled 2012-05-27: qty 1

## 2012-05-27 NOTE — Progress Notes (Signed)
30 Days Post-Op  Subjective: RLQ drain placed 8/9; up sized 8/14 Draining less daily Feels some better  Objective: Vital signs in last 24 hours: Temp:  [97 F (36.1 C)-98.7 F (37.1 C)] 97.2 F (36.2 C) (09/05 1050) Pulse Rate:  [64-91] 72  (09/05 1050) Resp:  [16-20] 18  (09/05 1050) BP: (99-192)/(64-92) 130/76 mmHg (09/05 1050) SpO2:  [92 %-100 %] 92 % (09/05 1050) Weight:  [110 lb (49.896 kg)-113 lb 1.5 oz (51.3 kg)] 113 lb 1.5 oz (51.3 kg) (09/05 0634) Last BM Date: 05/26/12  Intake/Output from previous day: 09/04 0701 - 09/05 0700 In: 4127.7 [P.O.:437; I.V.:283.5; IV Piggyback:150; TPN:3237.2] Out: 35 [Drains:35] Intake/Output this shift: Total I/O In: 0  Out: 3784 [Other:3784]  PE: in dialysis Afeb; vss Wbc wnl Output 35 cc yesterday; bloody Site clean and dry   Lab Results:   [facility] 05/27/12 0648 05/25/12 0907  WBC 7.2 6.2  HGB 12.3* 11.9*  HCT 36.5* 34.9*  PLT 149* 110*   BMET  Basename 05/27/12 0649 05/25/12 0907  NA 128* 128*  K 3.4* 3.5  CL 91* 90*  CO2 19 24  GLUCOSE 100* 97  BUN 61* 50*  CREATININE 5.35* 4.85*  CALCIUM 9.9 9.5   PT/INR No results found for this basename: LABPROT:2,INR:2 in the last 72 hours ABG No results found for this basename: PHART:2,PCO2:2,PO2:2,HCO3:2 in the last 72 hours  Studies/Results: No results found.  Anti-infectives:   Assessment/Plan: s/p Procedure(s) (LRB) with comments: THROMBECTOMY ARTERIOVENOUS GORE-TEX GRAFT (Left) - Thrombectomy of left thigh arteriovenous gortex graft REVISION OF ARTERIOVENOUS GORETEX GRAFT (Left) - using 62mmx10cm Gore-Tex Vascular Graft  RLQ drain intact Will follow Output diminishing   Joette Schmoker A 05/27/2012

## 2012-05-27 NOTE — Progress Notes (Signed)
Subjective:  On hd no cos, tolerated liquids yesterday but small amouts Objective Vital signs in last 24 hours: Filed Vitals:   05/27/12 0800 05/27/12 0830 05/27/12 0900 05/27/12 0930  BP: 154/69 132/78 117/80 124/73  Pulse: 68 70 76 76  Temp:      TempSrc:      Resp: 18 18 18 18   Height:      Weight:      SpO2:       Weight change: -2.404 kg (-5 lb 4.8 oz)  Intake/Output Summary (Last 24 hours) at 05/27/12 0946 Last data filed at 05/27/12 0600  Gross per 24 hour  Intake 4072.67 ml  Output     35 ml  Net 4037.67 ml   Labs: Basic Metabolic Panel:  Lab 05/27/12 6213 05/25/12 0907 05/24/12 0757 05/24/12 0530 05/22/12 0816  NA 128* 128* 127* -- --  K 3.4* 3.5 3.5 -- --  CL 91* 90* 91* -- --  CO2 19 24 19  -- --  GLUCOSE 100* 97 122* -- --  BUN 61* 50* 77* -- --  CREATININE 5.35* 4.85* 6.41* -- --  CALCIUM 9.9 9.5 9.7 -- --  ALB -- -- -- -- --  PHOS 2.7 -- -- 2.5 2.5   Liver Function Tests:  Lab 05/27/12 0649 05/25/12 0907 05/24/12 0530  AST -- 36 28  ALT -- 30 30  ALKPHOS -- 161* 159*  BILITOT -- 1.6* 1.9*  PROT -- 5.2* 5.3*  ALBUMIN 1.6* 1.5* 1.6*   No results found for this basename: LIPASE:3,AMYLASE:3 in the last 168 hours No results found for this basename: AMMONIA:3 in the last 168 hours CBC:  Lab 05/27/12 0648 05/25/12 0907 05/24/12 0530 05/22/12 0816  WBC 7.2 6.2 7.4 --  NEUTROABS -- -- 4.9 --  HGB 12.3* 11.9* 11.9* --  HCT 36.5* 34.9* 35.3* --  MCV 81.7 81.5 82.7 81.2  PLT 149* 110* 117* --   Cardiac Enzymes: No results found for this basename: CKTOTAL:5,CKMB:5,CKMBINDEX:5,TROPONINI:5 in the last 168 hours CBG:  Lab 05/26/12 2358 05/26/12 1625 05/26/12 0821 05/25/12 2334 05/25/12 1632  GLUCAP 119* 160* 104* 124* 115*    Iron Studies: No results found for this basename: IRON,TIBC,TRANSFERRIN,FERRITIN in the last 72 hours Studies/Results: No results found. Medications:    . fat emulsion 240 mL (05/26/12 1812)  . TPN (CLINIMIX) +/- additives  70 mL/hr at 05/25/12 1754  . TPN (CLINIMIX) +/- additives 70 mL/hr at 05/26/12 1812      . antiseptic oral rinse  15 mL Mouth Rinse q12n4p  . chlorhexidine  15 mL Mouth Rinse BID  . darbepoetin (ARANESP) injection - DIALYSIS  100 mcg Intravenous Q Sat-HD  . feeding supplement  1 Container Oral Q24H  . fentaNYL  37.5 mcg Transdermal Q72H  . fluconazole (DIFLUCAN) IV  400 mg Intravenous Q T,Th,Sa-HD  . heparin  40 Units/kg Dialysis Once in dialysis  . hydrocortisone sod succinate (SOLU-CORTEF) injection  20 mg Intravenous Daily  . insulin aspart  0-15 Units Subcutaneous Q8H  . labetalol  10 mg Intravenous Q6H  . lip balm   Topical BID  . pantoprazole (PROTONIX) IV  40 mg Intravenous Daily  . piperacillin-tazobactam (ZOSYN)  IV  2.25 g Intravenous Q8H  . simethicone  80 mg Oral QID  . sodium chloride  10-40 mL Intracatheter Q12H   I  have reviewed scheduled and prn medications.   Physical Exam:  General: Alert, NAD  Heart: RRR, 1/6 sem lsb  Lungs: CTA  Abdomen: bs  pos. , surgical dressing intact, nontender, soft  Extremities: Dialysis Access: no pedal edema, on hd patent  Left fem avggg    Assessment/Plan:  1. SBO sp surg/dehisc/abscess w controlled EC fistula and pelvic drain=CCS managing on po liquids and on zoysn and still on diflucan,still requiring TNA 2. ESRD - no hep HD per surg request; changed to TIW HD, TTS schedule for now - Hd - and use 4 K bath. 3. Anemia - Hgb 12.3 trending up  / will hold  aranesp / no iron; 4. MBD - phos improved, 2.7 ,-  No binders, no Zemplar/ 2.25 Ca bath / Ca ~~12 prior, now ~11.3 5. HTN/volume - max uf each HD;  , IV metoprolol 5 mg q 6 hours/ volume stable now with hd 3x per wk. 6 . Malnutrition = On TNA Alb 1.6, but prealbumin 23.4( 8/26 /13) 7. Gout - stable No pain on Solu-Cortef   [name], PA-C [facility] [phone] 05/27/2012,9:46 AM  LOS: 55 days  I have seen and examined this patient and agree with plan  per [name].  Difficulty tolerating liquids.  Again, if FU CT is needed, he is agreeable to placement of NG to give the contrast.. Chayson Charters T,MD 05/27/2012 10:05 AM

## 2012-05-27 NOTE — Progress Notes (Signed)
Nutrition Follow-up/Consult  Intervention:   1. Continue TPN per pharmacy 2. Continue Breeze daily. Provided emotional support and encouraged pt to consume liquids as able throughout the day. 3. Recommend trophic enteral feedings once able - will defer to surgery 4. RD to continue to follow nutrition care plan   Assessment:   Pt had BM yesterday. Pt stated that he was able to drink an entire [employer] over the course of yesterday! Encouraged pt to continue to consume liquids as tolerated. Pt wanting a variety in [employer], RN aware. Advanced to Full Liquids today. Pt met goal of meeting at least 90% estimated needs with TPN + intake of at least 1 Resource Breeze (250 kcal and 9 grams protein.)  This RD discussed possibility of enteral nutrition with surgical PA, Blenda Mounts. PA stated he would address with colleagues.  Patient continues to TPN with Clinimix 5/15 @ 70 ml/hr. Lipids (20% IVFE @ 10 ml/hr), multivitamins, and trace elements are provided 3 times weekly (MWF) due to national backorder. Provides 1398 kcal and 84 grams protein daily (based on weekly average). Meets 82% minimum estimated kcal and 100% estimated protein needs. Prealbumin remains WNL.   Diet Order:  Full Liquids Supplement: Resource Breeze daily  Meds: Scheduled Meds:    . antiseptic oral rinse  15 mL Mouth Rinse q12n4p  . chlorhexidine  15 mL Mouth Rinse BID  . feeding supplement  1 Container Oral Q24H  . fentaNYL  37.5 mcg Transdermal Q72H  . fluconazole (DIFLUCAN) IV  400 mg Intravenous Q T,Th,Sa-HD  . heparin  40 Units/kg Dialysis Once in dialysis  . hydrocortisone sod succinate (SOLU-CORTEF) injection  20 mg Intravenous Daily  . insulin aspart  0-15 Units Subcutaneous Q8H  . labetalol  10 mg Intravenous Q6H  . lip balm   Topical BID  . pantoprazole (PROTONIX) IV  40 mg Intravenous Daily  . piperacillin-tazobactam (ZOSYN)  IV  2.25 g Intravenous Q8H  . simethicone  80 mg Oral QID  . sodium  chloride  10-40 mL Intracatheter Q12H  . DISCONTD: darbepoetin (ARANESP) injection - DIALYSIS  100 mcg Intravenous Q Sat-HD   Continuous Infusions:    . fat emulsion 240 mL (05/26/12 1812)  . TPN (CLINIMIX) +/- additives 70 mL/hr at 05/25/12 1754  . TPN (CLINIMIX) +/- additives 70 mL/hr at 05/26/12 1812  . TPN (CLINIMIX) +/- additives     PRN Meds:.sodium chloride, acetaminophen, acetaminophen, albuterol, heparin, heparin, lidocaine, lidocaine-prilocaine, morphine injection, nitroGLYCERIN, ondansetron (ZOFRAN) IV, ondansetron, pentafluoroprop-tetrafluoroeth, promethazine, sodium chloride  Labs:  CMP     Component Value Date/Time   NA 128* 05/27/2012 0649   K 3.4* 05/27/2012 0649   CL 91* 05/27/2012 0649   CO2 19 05/27/2012 0649   GLUCOSE 100* 05/27/2012 0649   BUN 61* 05/27/2012 0649   CREATININE 5.35* 05/27/2012 0649   CALCIUM 9.9 05/27/2012 0649   PROT 5.6* 05/27/2012 0649   ALBUMIN 1.6* 05/27/2012 0649   ALBUMIN 1.7* 05/27/2012 0649   AST 42* 05/27/2012 0649   ALT 37 05/27/2012 0649   ALKPHOS 170* 05/27/2012 0649   BILITOT 1.6* 05/27/2012 0649   GFRNONAA 11* 05/27/2012 0649   GFRAA 13* 05/27/2012 0649   Phosphorus  Date/Time Value Range Status  05/27/2012  6:49 AM 2.7  2.3 - 4.6 mg/dL Final  09/27/1094  0:45 AM 2.5  2.3 - 4.6 mg/dL Final  12/29/8117  1:47 AM 2.5  2.3 - 4.6 mg/dL Final   Potassium  Date/Time Value Range Status  05/27/2012  6:49 AM 3.4* 3.5 - 5.1 mEq/L Final  05/25/2012  9:07 AM 3.5  3.5 - 5.1 mEq/L Final  05/24/2012  7:57 AM 3.5  3.5 - 5.1 mEq/L Final   Magnesium  Date/Time Value Range Status  05/27/2012  6:49 AM 1.5  1.5 - 2.5 mg/dL Final  09/27/1094  0:45 AM 1.5  1.5 - 2.5 mg/dL Final  12/29/8117  1:47 AM 1.5  1.5 - 2.5 mg/dL Final   Prealbumin  Date/Time Value Range Status  05/24/2012  5:30 AM 24.7  17.0 - 34.0 mg/dL Final  05/20/5620  3:08 AM 23.4  17.0 - 34.0 mg/dL Final  6/57/8469  6:29 AM 26.6  17.0 - 34.0 mg/dL Final    Intake/Output Summary (Last 24 hours) at 05/27/12 1151 Last  data filed at 05/27/12 1138  Gross per 24 hour  Intake 4102.67 ml  Output   3819 ml  Net 283.67 ml  BM 9/4  Weight Status: 46.9 kg s/p HD 9/5 - trending down 49.6 kg s/p HD on 9/3  49.8 kg s/p HD on 9/3  53.1 kg s/p HD on 8/27  51.5 kg s/p HD on 8/22 52.1 kg s/p HD on 8/20  54 kg s/p HD on 8/17 63.8 kg s/p HD on 8/12 65.7 kg s/p HD on 8/8  50.5 kg s/p HD on 8/6 50 kg s/p HD on 8/5 47.8 kg s/p HD on 7/30  48.3 kg s/p HD on 7/27  Body mass index is 14.42 kg/(m^2). Underweight.  Estimated needs:  [calculated using the Kidney Disease Outcomes Quality Initiative (KDOQI) Adjustment Equation for the Underweight Patient]: 1700 - 1950 kcal, 80 - 100 grams protein daily  Nutrition Dx:  Inadequate oral intake now R/T GI distress AEB pt report. Ongoing.  Goal:  Intake to meet at least 90% of estimated needs. Met with TPN + oral intake.  Monitor:  Diet advancement, TPN adequacy, weights, labs, I/O's, enteral nutrition initiation  Jarold Motto MS, RD, LDN Pager: [phone] After-hours pager: [phone]

## 2012-05-27 NOTE — Procedures (Signed)
Pt seen on HD.Marland Kitchen Ap 180  Vp 250  Tolerating HD well so far. On 4K bath.

## 2012-05-27 NOTE — Progress Notes (Signed)
Off unit.

## 2012-05-27 NOTE — Progress Notes (Signed)
Resting in bed, family at bedside.  TPN @70  ml/hr and Lipids @10  ml/hr via right groin TLC.  Urostomy bag inplace to right lower quadrant.  No acute distress noted.  Assessment unchanged.

## 2012-05-27 NOTE — Progress Notes (Signed)
Patient ID: [name], male   DOB: 09-08-1961, 51 y.o.   MRN: [id number] 30 Days Post-Op  Subjective: No complaints, only able to take small amounts of POs due to feeling full but no nausea or vomiting, having small BMs and lots of gas.  Feels good about taking in more solid foods.   Objective: Vital signs in last 24 hours: Temp:  [97 F (36.1 C)-98.7 F (37.1 C)] 97.6 F (36.4 C) (09/05 0634) Pulse Rate:  [64-87] 67  (09/05 0730) Resp:  [16-20] 16  (09/05 0730) BP: (137-192)/(71-92) 137/77 mmHg (09/05 0730) SpO2:  [96 %-100 %] 98 % (09/05 0634) Weight:  [110 lb (49.896 kg)-113 lb 1.5 oz (51.3 kg)] 113 lb 1.5 oz (51.3 kg) (09/05 0634) Last BM Date: 05/26/12  Intake/Output from previous day: 09/04 0701 - 09/05 0700 In: 4127.7 [P.O.:437; I.V.:283.5; IV Piggyback:150; TPN:3237.2] Out: 35 [Drains:35] Intake/Output this shift:    General appearance: alert, cooperative, no distress and on dialysis GI: soft, NT, ND, midline wound with granulation tissue and some fibrinous exudate, exposed sutures and no drainage. RLQ drain with minimal output  Lab Results:   Basename 05/27/12 0648 05/25/12 0907  WBC 7.2 6.2  HGB 12.3* 11.9*  HCT 36.5* 34.9*  PLT 149* 110*   BMET  Basename 05/25/12 0907  NA 128*  K 3.5  CL 90*  CO2 24  GLUCOSE 97  BUN 50*  CREATININE 4.85*  CALCIUM 9.5   PT/INR No results found for this basename: LABPROT:2,INR:2 in the last 72 hours ABG No results found for this basename: PHART:2,PCO2:2,PO2:2,HCO3:2 in the last 72 hours  Studies/Results: No results found.  Anti-infectives: Anti-infectives     Start     Dose/Rate Route Frequency Ordered Stop   05/22/12 1200   fluconazole (DIFLUCAN) IVPB 400 mg        400 mg 200 mL/hr over 60 Minutes Intravenous Every T-Th-Sa (Hemodialysis) 05/21/12 1108     05/20/12 2000   fluconazole (DIFLUCAN) IVPB 400 mg  Status:  Discontinued        400 mg 200 mL/hr over 60 Minutes Intravenous Once per day on Mon Tue  Thu Sat 05/19/12 1417 05/21/12 1108   05/20/12 1700   piperacillin-tazobactam (ZOSYN) IVPB 2.25 g        2.25 g 100 mL/hr over 30 Minutes Intravenous Every 8 hours 05/20/12 1634     05/17/12 1600   fluconazole (DIFLUCAN) IVPB 200 mg        200 mg 100 mL/hr over 60 Minutes Intravenous  Once 05/17/12 1129 05/17/12 1940   05/08/12 1200   fluconazole (DIFLUCAN) IVPB 400 mg  Status:  Discontinued        400 mg 200 mL/hr over 60 Minutes Intravenous Every T-Th-Sa (Hemodialysis) 05/07/12 1752 05/19/12 1417   04/30/12 1400   vancomycin (VANCOCIN) 750 mg in sodium chloride 0.9 % 150 mL IVPB  Status:  Discontinued        750 mg 150 mL/hr over 60 Minutes Intravenous  Once 04/30/12 1212 04/30/12 1507   04/29/12 2330   vancomycin (VANCOCIN) 1,500 mg in sodium chloride 0.9 % 500 mL IVPB        1,500 mg 250 mL/hr over 120 Minutes Intravenous  Once 04/29/12 2259 04/30/12 0246   04/27/12 0000   ceFAZolin (ANCEF) IVPB 1 g/50 mL premix  Status:  Discontinued     Comments: Send with pt to OR      1 g 100 mL/hr over 30 Minutes Intravenous On call  04/26/12 0927 04/26/12 0928   04/24/12 1000   micafungin (MYCAMINE) 100 mg in sodium chloride 0.9 % 100 mL IVPB  Status:  Discontinued        100 mg 100 mL/hr over 1 Hours Intravenous Daily 04/24/12 0750 05/07/12 1713   04/23/12 1600   fluconazole (DIFLUCAN) IVPB 400 mg        400 mg 200 mL/hr over 60 Minutes Intravenous  Once 04/23/12 1428 04/23/12 1800   04/22/12 2200   piperacillin-tazobactam (ZOSYN) IVPB 3.375 g  Status:  Discontinued        3.375 g 100 mL/hr over 30 Minutes Intravenous 3 times per day 04/22/12 2120 04/22/12 2137   04/22/12 2200   piperacillin-tazobactam (ZOSYN) IVPB 2.25 g  Status:  Discontinued        2.25 g 100 mL/hr over 30 Minutes Intravenous 3 times per day 04/22/12 2140 05/20/12 0748   04/22/12 2157   gentamicin (GARAMYCIN) injection  Status:  Discontinued          As needed 04/22/12 2158 04/22/12 2335   04/22/12 2156    clindamycin (CLEOCIN) injection  Status:  Discontinued          As needed 04/22/12 2157 04/22/12 2335   04/22/12 2145   piperacillin-tazobactam (ZOSYN) IVPB 3.375 g        3.375 g 12.5 mL/hr over 240 Minutes Intravenous  Once 04/22/12 2141 04/23/12 0145   04/22/12 2130   gentamicin (GARAMYCIN) 240 mg, clindamycin (CLEOCIN) 900 mg in sodium chloride irrigation 0.9 % 1,000 mL irrigation  Status:  Discontinued         Irrigation  Once 04/22/12 2119 04/23/12 0020   04/14/12 0830   ertapenem (INVANZ) 1 g in sodium chloride 0.9 % 50 mL IVPB        1 g 100 mL/hr over 30 Minutes Intravenous To Surgery 04/14/12 0816 04/14/12 0834   04/13/12 1359   ertapenem (INVANZ) 1 g in sodium chloride 0.9 % 50 mL IVPB  Status:  Discontinued        1 g 100 mL/hr over 30 Minutes Intravenous 60 min pre-op 04/13/12 1359 04/14/12 0816          Assessment/Plan: s/p Procedure(s) (LRB) with comments: THROMBECTOMY ARTERIOVENOUS GORE-TEX GRAFT (Left) - Thrombectomy of left thigh arteriovenous gortex graft REVISION OF ARTERIOVENOUS GORETEX GRAFT (Left) - using 54mmx10cm Gore-Tex Vascular Graft SBO s/p el/loa and SBR x2 with EC fistula and leak: EC fistula on midline needs to have wet to dry dressing changes, will continue drain on right.  Biggest issue for patient right now is the fact that he has been bed bound due to PICC line in right groin.  He has a perm-cath in left groin which will not limit his activity level.  He has very limited access due to being burned out from dialysis.  Will discuss access options with vascular and nephrology today and see if a comprise can be made to get picc out.  Once his PO intake is better we can d/c TNA but this may be several weeks for him.  .    LOS: 55 days    WHITE, [name] 05/27/2012 No change in output from drain and no midline drainage.  Wean TPN to off as his PO intake increases.

## 2012-05-27 NOTE — Progress Notes (Signed)
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 Allopurinol     REACTION: decreased platelets  . Aspirin     REACTION: unspecified    Patient Measurements: Height: 5\' 11"  (180.3 cm) Weight: 113 lb 1.5 oz (51.3 kg) IBW/kg (Calculated) : 75.3    Vital Signs: Temp: 97.6 F (36.4 C) (09/05 0634) Temp src: Oral (09/05 0634) BP: 99/64 mmHg (09/05 1030) Pulse Rate: 91  (09/05 1030) Intake/Output from previous day: 09/04 0701 - 09/05 0700 In: 4127.7 [P.O.:437; I.V.:283.5; IV Piggyback:150; TPN:3237.2] Out: 35 [Drains:35] Intake/Output from this shift:    Labs:  [facility] 05/27/12 0648 05/25/12 0907  WBC 7.2 6.2  HGB 12.3* 11.9*  HCT 36.5* 34.9*  PLT 149* 110*  APTT -- --  INR -- --     [facility] 05/27/12 0649 05/25/12 0907  NA 128* 128*  K 3.4* 3.5  CL 91* 90*  CO2 19 24  GLUCOSE 100* 97  BUN 61* 50*  CREATININE 5.35* 4.85*  LABCREA -- --  CREAT24HRUR -- --  CALCIUM 9.9 9.5  MG 1.5 --  PHOS 2.7 --  PROT 5.6* 5.2*  ALBUMIN 1.6*1.7* 1.5*  AST 42* 36  ALT 37 30  ALKPHOS 170* 161*  BILITOT 1.6* 1.6*  BILIDIR 1.1* --  IBILI 0.5 --  PREALBUMIN -- --  TRIG -- --  CHOLHDL -- --  CHOL -- --   Estimated Creatinine Clearance: 11.7 ml/min (by C-G formula based on Cr of 5.35).    Basename 05/26/12 2358 05/26/12 1625 05/26/12 0821  GLUCAP 119* 160* 104*   Insulin Requirements in the past 24 hours:  3 units Novolog SSI and 15 units regular insulin in TNA  Nutritional Goals:  1700-1950 kCal, 80-100 grams of protein per day   Current Nutrition:  Clinimix 5/15 at 70 ml/hr with 20% lipids at 10 ml/hr MWF provides an average of 1398 kcal and 84g protein per day  Admit: Admitted 7/13 with abd pain, weight loss, SBO. S/p LOA, SBR 7/24 and repeat LOA, SBR with anasomotic leak repair 8/1, thrombectomy with AVG revision 7/25 and repeat thrombectomy 8/6. CT on 8/13 reveals enlarging  abscess and EC fistula.  GI: Tolerating TPN. Pt unable to tolerate CT contrast 9/3 -plans for CT abd to assess RLQ site as drain output is minimal, noted to be brown in color. Midline healing w no new drainage after starting clears. Pt's wt appears to be relatively stable. Gravity drain bag due to controlled SB. Started on clears per surgery, PO'd 100% of dinner 9/4. Pt becoming less anxious about increasing PO intake per RD note. ? NG tube placement for CT contrast if needed, renal MD discussed this w pt. +BS, flatulence, BM noted 9/4. Per primary MD, plan is to continue TPN until PO intake improves, could be several weeks  Endo: CBGs well controlled. CBGs controlled on minimal SSI. On solu-cortef 20 daily (pred 5 daily PTA)  Lytes: Na low at 128 d/t overload, K stable at 3.4 (K bath in HD). Hypercalcemia continues (corrected Ca 11.7, Ca x PO4 remains < 55). Mag 1.5, Phos 2.7. Renal MD replacing lytes.  Renal: ESRD with HD typically TTS. HD is currently being administered prn d/t volume issues. Currently 4x/week HD MTTS while pt requiring TPN - Noted plan to try and cut back to T/T/S. If TPN continues, CSW trying to find LTAC HD bed that can accommodate HD 4x/wk. On Aranesp qSat- dose decreased  9/4, now on hold with Hb 12.3.    Cards: BP variable. (continues with hypotension during HD). On scheduled lopressor, prn hydralazine and labetolol  Hepatobil: LFTs WNL, except alk phos/Tbili elevated. Prealbumin remains at goal. Trig WNL 128  Neuro: GCS 20, no c/o pain - on fentanyl patch, prn pain meds.   ID: Fluconazole + Zosyn for peritonitis/abd abscess -- Noted primary team plans to cont abx thro 9/16, then re-CT abd and consult with ID for further recs on abx duration. Afeb, WBC WNL. Abd abscess culture: candida tropicalis.   Zosyn 8.1>>  Diflucan 8.16>> (s/p 14 d mycamine)  Heme/Onc: Hgb up to 12.3 on Aranesp q Saturday (on hold).  Best Practices: Mouthcare, PPI po  Plan:  1. Continue  Clinimix 5/15 (no lytes) @ 70 ml/hr to avoid exacerbating volume status. MD/RD aware TPN not meeting kcal goals with Clinimix alone. Pt appears to be maintaining weight and prealbumin on current regimen, now w increasing PO intake. 2. Will defer electrolyte replacement to renal MD 3. Provide available trace elements, MVI and lipids @10cc /hr on MWF only d/t national backorder 4. F/up PO intake to assess future TPN needs 5. F/u fluid status post-HD 6. Will f/up CT results in AM  Thank you for the consult.  Tomi Bamberger, PharmD Clinical Pharmacist Pager: [phone] Pharmacy: [phone] 05/27/2012 11:34 AM

## 2012-05-27 NOTE — Progress Notes (Signed)
[facility] PROGRESS NOTE  ISAIR INABINET VHQ:[id number] DOB: 12/15/1960 DOA: 04/02/2012 PCP: Trevor Iha, MD  Brief narrative: 51 year old male with past medical history of ESRD (MWF), s/p renal transplant about 17 years ago, prostatectomy in 2012, admitted 04/02/2012 with abdominal pain, nausea and vomiting. Work up done indicated SBO and pt has required Ex Lap for lysis of adhesions. He subsequently had an anastomotic leak with an abscess which has necessitated an omental patching of the anastomosis and percutaneous drain placement. The patient had an elevation of his white blood cell count and CT of the abdomen revealed an enlarging abscess an enteric fistula was obstructed drainage tube necessitated repositioning of the percutaneous drain a percutaneous drain. Presently the patient has an open abdominal wound which is connected with the abscess cavity and is being managed by general surgery and wound ostomy care.   Principal Problem:  *SBO (small bowel obstruction)  - patient is s/p exploratory laparotomy and lysis of adhesions and partial small bowel x 2 on 7/24.  - Patient had subsequent anastomotic leak- omental patch of anastomosis on 8/2.  - On 8/8, CT abdomen and pelvis demonstrated several fluid collections consistent with abscess, largest measuring 16.5 x 13.7 x 6.4 cm.  - Patient is status post  percutaneous drainage of largest fluid collection 04/30/2012. Repeat CT done for increasing WBC count on 8/13 revealed enlarging abscess and an enteric fistula with obstructed drainage tube. The drain was repositioned on 8/14. Since that time, he has had an open abdominal wound which connects to an abscess cavity, which has been slowly closing.  - Appreciate general surgery following  - Added simethicone per patient request  - Zofran and phenergan prn nausea and/or vomiting  Active Problems: Peritonitis secondary to anastomotic dehiscence/intra-abdominal abscess  - continue zosyn  for now Ileus, resolved.  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  - Patient should not bend the right hip per interventional radiology.  Chronic nonocclusive left lower extremity DVT.  - resolved - Diagnosed via Doppler 04/30/2012 and follow up on 8/21 showed no evidence of DVT, superficial thrombosis or baker's cyst.  CKD (chronic kidney disease) stage V requiring chronic dialysis  - Dialysis per nephrology  HTN (hypertension),  - BP 129/68, at goal - will continue to monitor vitals per floor protocol  Anemia due to chronic disease  - Stable  - Has received a total of 4 units of packed red blood cells this admission  - Will continue aranesp and ferric gluconate  Chronic use of steroids (gout).  - Patient had been on pred 5mg  daily. Weaned IV hydrocortisone. Currently at 25 mg daily.  - Wean to 20mg  daily 9/2 (equivalent to pred 5) and continue until tolerating PO   Leg graft occlusion  - s/p thrombectomy 7/25  - unfortunately re clotted and undergone an additional thrombectomy procedure with revision of arterial and graft on 04/27/2012.  Severe protein calorie malnutrition  - Patient is cachectic - Parenteral nutrition managed by surgery and pharmacy.   Code Status: Full  Family Communication: Pt at bedside over 60 minutes and sister over the phone  Disposition Plan: Pending LTAC bed vs  SNF vs home   Consultants:  Surgery  Nephrology  Antibiotics:  Zosyn 05/20/2012 -->  Danie Binder,  MD  [facility] Pager [phone]  If 7PM-7AM, please contact night-coverage [email or web address] Password  [email or web address] 05/27/2012, 5:14 PM   LOS: 55 days   HPI/Subjective: No acute events overnight.  Objective: Filed Vitals:   05/27/12 1050 05/27/12 1139 05/27/12 1348 05/27/12 1712  BP: 130/76 133/91 140/76 129/68  Pulse: 72 86 96 76  Temp: 97.2 F (36.2 C) 97.2 F (36.2 C) 98.2 F (36.8 C) 98 F (36.7 C)  TempSrc: Oral     Resp: 18 15 17 16   Height:      Weight: 46.9 kg (103 lb 6.3 oz)     SpO2: 92% 100% 100% 97%    Intake/Output Summary (Last 24 hours) at 05/27/12 1714 Last data filed at 05/27/12 1300  Gross per 24 hour  Intake 2851.68 ml  Output   3819 ml  Net -967.32 ml    Exam:   General:  Pt is alert, no acute distress  Cardiovascular: Regular rate and rhythm, S1/S2, no murmurs, no rubs, no gallops  Respiratory: Clear to auscultation bilaterally, no wheezing, no crackles, no rhonchi  Abdomen: Soft, non tender, non distended, bowel sounds present, no guarding  Extremities: No edema, pulses DP and PT palpable bilaterally  Neuro: Grossly nonfocal  Data Reviewed: Basic Metabolic Panel:  Lab 05/27/12 4540 05/25/12 0907 05/24/12 0757 05/24/12 0530 05/22/12 0816 05/21/12 0631  NA 128* 128* 127* 128* 127* --  K 3.4* 3.5 3.5 3.6 3.2* --  CL 91* 90* 91* 91* 92* --  CO2 19 24 19 20 20  --  GLUCOSE 100* 97 122* 105* 94 --  BUN 61* 50* 77* 74* 70* --  CREATININE 5.35* 4.85* 6.41* 6.32* 5.91* --  CALCIUM 9.9 9.5 9.7 10.0 9.7 --  MG 1.5 -- -- 1.5 -- 1.5  PHOS 2.7 -- -- 2.5 2.5 2.2*   Liver Function Tests:  Lab 05/27/12 0649 05/25/12 0907 05/24/12 0530 05/22/12 0816  AST 42* 36 28 --  ALT 37 30 30 --  ALKPHOS 170* 161* 159* --  BILITOT 1.6* 1.6* 1.9* --  PROT 5.6* 5.2* 5.3* --  ALBUMIN 1.6*1.7* 1.5* 1.6* 1.5*   CBC:  Lab 05/27/12 0648 05/25/12 0907 05/24/12 0530 05/22/12 0816  WBC 7.2 6.2 7.4 9.2  HGB 12.3* 11.9* 11.9* 11.3*  HCT 36.5* 34.9* 35.3* 33.3*  MCV 81.7 81.5 82.7 81.2  PLT 149* 110* 117* 137*   CBG:  Lab 05/27/12 1137 05/26/12 2358 05/26/12 1625  05/26/12 0821 05/25/12 2334  GLUCAP 116* 119* 160* 104* 124*    Studies: No results found.  Scheduled Meds:   . antiseptic oral rinse  15 mL Mouth Rinse q12n4p  . chlorhexidine  15 mL Mouth Rinse BID  . feeding supplement  1 Container Oral Q24H  . fentaNYL  37.5 mcg Transdermal Q72H  . fluconazole (DIFLUCAN) IV  400 mg Intravenous Q T,Th,Sa-HD  . heparin  40 Units/kg Dialysis Once in dialysis  . hydrocortisone sod succinate (SOLU-CORTEF) injection  20 mg Intravenous Daily  . insulin aspart  0-15 Units Subcutaneous Q8H  . labetalol  10 mg Intravenous Q6H  . lip balm   Topical BID  . pantoprazole (PROTONIX) IV  40 mg Intravenous Daily  . piperacillin-tazobactam (ZOSYN)  IV  2.25 g Intravenous Q8H  . simethicone  80 mg Oral QID  . sodium chloride  10-40 mL Intracatheter Q12H  . DISCONTD: darbepoetin (ARANESP) injection -  DIALYSIS  100 mcg Intravenous Q Sat-HD   Continuous Infusions:   . fat emulsion 240 mL (05/26/12 1812)  . TPN (CLINIMIX) +/- additives 70 mL/hr at 05/25/12 1754  . TPN (CLINIMIX) +/- additives 70 mL/hr at 05/26/12 1812  . TPN (CLINIMIX) +/- additives

## 2012-05-28 LAB — BASIC METABOLIC PANEL
BUN: 41 mg/dL — ABNORMAL HIGH (ref 6–23)
CO2: 26 mEq/L (ref 19–32)
Chloride: 93 mEq/L — ABNORMAL LOW (ref 96–112)
GFR calc non Af Amer: 15 mL/min — ABNORMAL LOW (ref >90)
Glucose, Bld: 104 mg/dL — ABNORMAL HIGH (ref 70–99)
Potassium: 3.8 mEq/L (ref 3.5–5.1)
Sodium: 132 mEq/L — ABNORMAL LOW (ref 135–145)

## 2012-05-28 LAB — CBC
HCT: 38.8 % — ABNORMAL LOW (ref 39.0–52.0)
Hemoglobin: 12.8 g/dL — ABNORMAL LOW (ref 13.0–17.0)
RBC: 4.7 MIL/uL (ref 4.22–5.81)
WBC: 6.3 10*3/uL (ref 4.0–10.5)

## 2012-05-28 LAB — FUNGUS CULTURE W SMEAR
Fungal Smear: NONE SEEN
Special Requests: 4

## 2012-05-28 LAB — GLUCOSE, CAPILLARY
Glucose-Capillary: 115 mg/dL — ABNORMAL HIGH (ref 70–99)
Glucose-Capillary: 91 mg/dL (ref 70–99)

## 2012-05-28 MED ORDER — ZINC TRACE METAL 1 MG/ML IV SOLN
INTRAVENOUS | Status: AC
  Administered 2012-05-28: 17:00:00 via INTRAVENOUS
  Filled 2012-05-28: qty 2000

## 2012-05-28 MED ORDER — FAT EMULSION 20 % IV EMUL
240.0000 mL | INTRAVENOUS | Status: AC
  Administered 2012-05-28: 240 mL via INTRAVENOUS
  Filled 2012-05-28: qty 250

## 2012-05-28 MED ORDER — NEPRO/CARBSTEADY PO LIQD
237.0000 mL | Freq: Three times a day (TID) | ORAL | Status: DC
  Administered 2012-05-29 – 2012-06-01 (×5): 237 mL via ORAL

## 2012-05-28 NOTE — Progress Notes (Signed)
Occupational Therapy Treatment Patient Details Name: [name] MRN: [id number] DOB: [birth year] Today's Date: 05/28/2012 Time: 4098-1191 OT Time Calculation (min): 15 min  OT Assessment / Plan / Recommendation Comments on Treatment Session Pt doing well with theraband exercises.  Pt asked to not YET upgrade theraband level.  He feels this is a good resistance for now.  Will check in next week with new theraband resistance.  If catheter is out, will progress therapy.      Follow Up Recommendations  LTACH    Barriers to Discharge       Equipment Recommendations  Defer to next venue    Recommendations for Other Services    Frequency Min 1X/week   Plan Discharge plan remains appropriate    Precautions / Restrictions Precautions Precautions: Fall Precaution Comments: No right LE hip flexion secondary to cathetor. Restrictions Weight Bearing Restrictions: No Other Position/Activity Restrictions: NO OOB   Pertinent Vitals/Pain Pt with no reports of pain.    ADL  Grooming: Performed;Set up Where Assessed - Grooming: Supine, head of bed up ADL Comments: Performed BUE exercises with Level 1 theraband. Pt able to complete exercises with theraband connected to bedrails with minimal cues.    OT Diagnosis:    OT Problem List:   OT Treatment Interventions:     OT Goals Acute Rehab OT Goals OT Goal Formulation: With patient Time For Goal Achievement: 06/03/12 Potential to Achieve Goals: Good Arm Goals Pt Will Complete Theraband Exer: with supervision, verbal cues required/provided;Bilateral upper extremities;1 set;Level 2 Theraband;Other (comment) Arm Goal: Theraband Exercises - Progress: Progressing toward goal Miscellaneous OT Goals Miscellaneous OT Goal #1: Pt will be able to tolerate upright standing with tilt table for 3 minutes OT Goal: Miscellaneous Goal #1 - Progress: Not progressing (Unable. No OOB.  Will d/c if catheter remains next week.) Miscellaneous OT Goal #2: Pt  will be able to ambulate to the bathroom and back in prep for transfers with Min A OT Goal: Miscellaneous Goal #2 - Progress: Not progressing (unable. No OOB.  Will d/c if catheter remains next week.)  Visit Information  Last OT Received On: 05/28/12 Assistance Needed: +1    Subjective Data      Prior Functioning       Cognition       Mobility  Shoulder Instructions         Exercises  General Exercises - Upper Extremity Shoulder Flexion: Strengthening;Both;20 reps;Theraband;Supine Theraband Level (Shoulder Flexion): Level 1 (Yellow) Shoulder Extension: Strengthening;20 reps;Theraband;Both Theraband Level (Shoulder Extension): Level 1 (Yellow) Shoulder ABduction: Strengthening;Both;20 reps;Supine Shoulder ADduction: Strengthening;20 reps;Supine;Theraband Theraband Level (Shoulder Adduction): Level 1 (Yellow) Shoulder Horizontal ABduction: Strengthening;Both;Theraband;Supine;20 reps Theraband Level (Shoulder Horizontal Abduction): Level 1 (Yellow) Elbow Flexion: Strengthening;20 reps;Theraband;Supine Theraband Level (Elbow Flexion): Level 1 (Yellow) Elbow Extension: Strengthening;Both;20 reps;Supine;Theraband Theraband Level (Elbow Extension): Level 1 (Yellow) Other Exercises Other Exercises: Did exercises with supervision   Balance     End of Session OT - End of Session Activity Tolerance: Patient limited by fatigue Patient left: in bed;with call bell/phone within reach;with family/visitor present Nurse Communication: Mobility status  GO     Hope Budds 05/28/2012, 9:52 AM [phone]

## 2012-05-28 NOTE — Clinical Social Work Psychosocial (Signed)
     Clinical Social Work Department BRIEF PSYCHOSOCIAL ASSESSMENT 05/28/2012  Patient:  [name], [name]     Account Number:  [id number]     Admit date:  04/02/2012  Clinical Social Worker:  Delmer Islam  Date/Time:  05/28/2012 08:30 AM  Referred by:  Physician  Date Referred:  05/26/2012 Referred for  SNF Placement   Other Referral:   Interview type:  Patient Other interview type:    PSYCHOSOCIAL DATA Living Status:  ALONE Admitted from facility:   Level of care:   Primary support name:  [place] Primary support relationship to patient:  FAMILY Degree of support available:   [name] is patient's sister - [phone]    CURRENT CONCERNS Current Concerns  Post-Acute Placement   Other Concerns:    SOCIAL WORK ASSESSMENT / PLAN 05/27/12 - Patient not eligible for LTAC placement and per MD requires more care than sister can provide. CSW received consult for SNF placement. MD spoke with patient and sister (by phone) on 05/27/12 regarding SNF placement and they are agreeable.    On 05/28/12, CSW talked with patient regarding discharge plans and MD's recommendation for SNF placement. Patient is aware that he he not ready to go home but is concerned about the facility he will go to. CSW advised patient that SNF choices may be limited due to him being on TNA, however a search will be done in [facility] and with [facility] in [place]. Patient also advised that his sister will be notified.   Assessment/plan status:  Psychosocial Support/Ongoing Assessment of Needs Other assessment/ plan:   Information/referral to community resources:   SNF list given to patient for [facility] and [facility].    PATIENTS/FAMILYS RESPONSE TO PLAN OF CARE: Patient initially appeared resistant but did admit that he cannot go home. He is understandably concerned about the facility he will go to and CSW explained the barriers and offered support. Patient advised that CSW will  follow-up with his sister.

## 2012-05-28 NOTE — Progress Notes (Signed)
ANTIBIOTIC CONSULT NOTE - FOLLOW UP  Pharmacy Consult:  Fluconazole and Zosyn Indication: abdominal abscess with fistula  Allergies  Allergen Reactions  . Allopurinol     REACTION: decreased platelets  . Aspirin     REACTION: unspecified    Patient Measurements: Height: 5\' 11"  (180.3 cm) Weight: 103 lb 6.3 oz (46.9 kg) IBW/kg (Calculated) : 75.3   Vital Signs: Temp: 98.1 F (36.7 C) (09/06 0812) Temp src: Oral (09/06 0812) BP: 153/75 mmHg (09/06 0812) Pulse Rate: 67  (09/06 0812) Intake/Output from previous day: 09/05 0701 - 09/06 0700 In: 2715 [P.O.:230; I.V.:270; IV Piggyback:360; TPN:1850] Out: 3784   Labs:  Basename 05/28/12 0555 05/27/12 0649 05/27/12 0648  WBC 6.3 -- 7.2  HGB 12.8* -- 12.3*  PLT 120* -- 149*  LABCREA -- -- --  CREATININE 4.16* 5.35* --      Assessment: 51 YO ESRD patient who continues on an extended course of fluconazole and Zosyn for intra-abdominal abscess and peritonitis.  Patient doing well.    8/1 Zosyn>>  8/2 Fluconazole>>8/3 (got 400 mg IV x1 on 8/2), restart 8/17 >> 400mg  qHD 8/3 micafungin >>8/16  8/9 vanc >>8/10  8/3 BC x2 >> NG 8/7 BC x 2 = NGTD 8/9 abscess >> candida tropicalis  MRSA PCR negative   Goal of Therapy:  Appropriate doses for renal function and infection   Plan:  - Zosyn 2.25gm IV Q8H - Fluconazole 400mg  IV qTSS - Monitor clinical course and abx LOT     Tyvion Edmondson D. Laney Potash, PharmD, BCPS Pager:  [phone] 05/28/2012, 9:51 AM

## 2012-05-28 NOTE — Progress Notes (Signed)
Patient ID: [name], male   DOB: [birth year], 51 y.o.   MRN: [id number] [facility] PROGRESS NOTE  [name]:[id number] DOB: [birth year] DOA: 04/02/2012 PCP: Trevor Iha, MD  Brief narrative: 51 year old male with past medical history of ESRD (MWF), s/p renal transplant about 17 years ago, prostatectomy in 2012, admitted 04/02/2012 with abdominal pain, nausea and vomiting. Work up done indicated SBO and pt has required Ex Lap for lysis of adhesions. He subsequently had an anastomotic leak with an abscess which has necessitated an omental patching of the anastomosis and percutaneous drain placement. The patient had an elevation of his white blood cell count and CT of the abdomen revealed an enlarging abscess an enteric fistula was obstructed drainage tube necessitated repositioning of the percutaneous drain a percutaneous drain. Presently the patient has an open abdominal wound which is connected with the abscess cavity and is being managed by general surgery and wound ostomy care.   Principal Problem:  *SBO (small bowel obstruction)  - patient is s/p exploratory laparotomy and lysis of adhesions and partial small bowel x 2 on 7/24.  - Patient has had subsequent anastomotic leak- omental patch of anastomosis on 8/2.  - On 8/8, CT abdomen and pelvis demonstrated several fluid collections consistent with abscess, largest measuring 16.5 x 13.7 x 6.4 cm.  - Patient is status post percutaneous drainage of largest fluid collection 04/30/2012. Repeat CT done for increasing WBC count on 8/13 revealed enlarging abscess and an enteric fistula with obstructed drainage tube. The drain was repositioned on 8/14. Since that time, he has had an open abdominal wound which connects to an abscess cavity, which has been slowly closing.  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  - Patient should not bend the right hip per interventional radiology.  Chronic nonocclusive left lower extremity DVT.  - resolved  - Diagnosed via Doppler 04/30/2012 and follow up on 8/21 showed no evidence of DVT, superficial thrombosis or baker's cyst.  CKD (chronic kidney disease) stage V requiring chronic dialysis  - Dialysis per nephrology schedule HTN (hypertension),  - BP 143/75 - Continue to monitor vitals per floor protocol  Anemia due to chronic disease  - Stable  - Has received a total of 4 units of packed red blood cells this admission  - Continue aranesp and ferric gluconate  GOUT - Patient had been on pred 5mg  daily. Weaned IV hydrocortisone. Currently at 25 mg daily.  - Wean to 20mg  daily 9/2 (equivalent to pred 5) and will continue until tolerating PO  Leg graft occlusion  - s/p thrombectomy 7/25  - unfortunately re clotted and undergone an additional thrombectomy procedure with revision of arterial and graft on 04/27/2012.  Severe protein calorie malnutrition  - Patient is cachectic  - Parenteral nutrition managed by surgery and pharmacy.   Code Status: Full  Family Communication: Pt at bedside over 60 minutes and sister over the phone  Disposition Plan: Pending LTAC bed vs SNF vs home   Consultants:  Surgery  Nephrology Antibiotics:  Zosyn 05/20/2012 -->  Danie Binder, MD  [facility] Pager [phone]  If 7PM-7AM, please contact  night-coverage [email or web address] Password [email or web address] 05/28/2012, 2:47 PM   LOS: 56 days   HPI/Subjective: No acute events overnight.  Objective: Filed Vitals:   05/28/12 0313 05/28/12 0527 05/28/12 0812 05/28/12 1223  BP: 144/77 131/66 153/75 143/75  Pulse: 64 64 67 69  Temp:  98 F (36.7 C) 98.1 F (36.7 C) 97.7 F (36.5 C)  TempSrc:  Oral Oral Oral  Resp: 20 20 18 16   Height:      Weight:      SpO2:  99% 100% 100%    Intake/Output Summary (Last 24 hours) at 05/28/12 1447 Last data filed at 05/28/12 0820  Gross per 24 hour  Intake 1788.49 ml  Output      0 ml  Net 1788.49 ml    Exam: General: Pt is alert, no acute distress  Cardiovascular: Regular rate and rhythm, S1/S2, no murmurs, no rubs, no gallops  Respiratory: Clear to auscultation bilaterally, no wheezing, no crackles, no rhonchi  Abdomen: Soft, non tender, non distended, bowel sounds present, no guarding  Extremities: AVG @ right thigh with + bruit, no edema, pulses DP and PT palpable bilaterally  Neuro: Grossly nonfocal  Data Reviewed: Basic Metabolic Panel:  Lab 05/28/12 4540 05/27/12 0649 05/25/12 0907 05/24/12 0757 05/24/12 0530  NA 132* 128* 128* 127* 128*  K 3.8 3.4* 3.5 3.5 3.6  CL 93* 91* 90* 91* 91*  CO2 26 19 24 19 20   GLUCOSE 104* 100* 97 122* 105*  BUN 41* 61* 50* 77* 74*  CREATININE 4.16* 5.35* 4.85* 6.41* 6.32*  CALCIUM 10.0 9.9 9.5 9.7 10.0   Liver Function Tests:  Lab 05/27/12 0649 05/25/12 0907 05/24/12 0530 05/22/12 0816  AST 42* 36 28 --  ALT 37 30 30 --  ALKPHOS 170* 161* 159* --  BILITOT 1.6* 1.6* 1.9* --  PROT 5.6* 5.2* 5.3* --  ALBUMIN 1.6*1.7* 1.5* 1.6* 1.5*   CBC:  Lab 05/28/12 0555 05/27/12 0648 05/25/12 0907 05/24/12 0530 05/22/12 0816  WBC 6.3 7.2 6.2 7.4 9.2  HGB 12.8* 12.3* 11.9* 11.9* 11.3*  HCT 38.8* 36.5* 34.9* 35.3* 33.3*  MCV 82.6 81.7 81.5 82.7 81.2  PLT 120* 149* 110* 117* 137*   CBG:  Lab 05/28/12 0823 05/27/12 2346 05/27/12 1710 05/27/12 1137 05/26/12  2358  GLUCAP 91 115* 156* 116* 119*    Studies: No results found.  Scheduled Meds:   . NEPRO CARB STEADY  237 mL Oral TID BM  . feeding supplement  1 Container Oral Q24H  . fentaNYL  37.5 mcg Transdermal Q72H  . fluconazole (DIFLUCAN)  400 mg Intravenous Q T,Th,Sa-HD  . heparin  40 Units/kg Dialysis Once in dialysis  .  (SOLU-CORTEF)  20 mg Intravenous Daily  . insulin aspart  0-15 Units Subcutaneous Q8H  . labetalol  10 mg Intravenous Q6H  . pantoprazole   40 mg Intravenous Daily  . piperacillin-tazobactam   2.25 g Intravenous Q8H  . simethicone  80 mg Oral QID   Continuous Infusions:   . fat emulsion 240 mL (05/26/12 1812)  . fat emulsion    . TPN (CLINIMIX) +/- additives 70 mL/hr at 05/26/12 1812  . TPN (CLINIMIX) +/- additives 70 mL/hr at 05/27/12 1726  . TPN (CLINIMIX) +/- additives

## 2012-05-28 NOTE — Progress Notes (Signed)
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 Allopurinol     REACTION: decreased platelets  . Aspirin     REACTION: unspecified    Patient Measurements: Height: 5\' 11"  (180.3 cm) Weight: 103 lb 6.3 oz (46.9 kg) IBW/kg (Calculated) : 75.3    Vital Signs: Temp: 98.1 F (36.7 C) (09/06 0812) Temp src: Oral (09/06 0812) BP: 153/75 mmHg (09/06 0812) Pulse Rate: 67  (09/06 0812) Intake/Output from previous day: 09/05 0701 - 09/06 0700 In: 2715 [P.O.:230; I.V.:270; IV Piggyback:360; TPN:1850] Out: 3784  Intake/Output from this shift: Total I/O In: 5 [Other:5] Out: -   Labs:  [facility] 05/28/12 0555 05/27/12 0648  WBC 6.3 7.2  HGB 12.8* 12.3*  HCT 38.8* 36.5*  PLT 120* 149*  APTT -- --  INR -- --     Basename 05/28/12 0555 05/27/12 0649  NA 132* 128*  K 3.8 3.4*  CL 93* 91*  CO2 26 19  GLUCOSE 104* 100*  BUN 41* 61*  CREATININE 4.16* 5.35*  LABCREA -- --  CREAT24HRUR -- --  CALCIUM 10.0 9.9  MG -- 1.5  PHOS -- 2.7  PROT -- 5.6*  ALBUMIN -- 1.6*1.7*  AST -- 42*  ALT -- 37  ALKPHOS -- 170*  BILITOT -- 1.6*  BILIDIR -- 1.1*  IBILI -- 0.5  PREALBUMIN -- --  TRIG -- --  CHOLHDL -- --  CHOL -- --   Estimated Creatinine Clearance: 13.8 ml/min (by C-G formula based on Cr of 4.16).    Basename 05/28/12 0823 05/27/12 2346 05/27/12 1710  GLUCAP 91 115* 156*   Insulin Requirements in the past 24 hours:  3 units Novolog SSI and 15 units regular insulin in TNA  Nutritional Goals:  1700-1950 kCal, 80-100 grams of protein per day   Current Nutrition:  Clinimix 5/15 at 70 ml/hr with 20% lipids at 10 ml/hr MWF provides an average of 1398 kcal and 84g protein per day  Admit: Admitted 7/13 with abd pain, weight loss, SBO. S/p LOA, SBR 7/24 and repeat LOA, SBR with anasomotic leak repair 8/1, thrombectomy with AVG revision 7/25 and repeat thrombectomy 8/6. CT on 8/13 reveals  enlarging abscess and EC fistula.  GI: Tolerating TPN. Pt unable to tolerate CT contrast 9/3 -plans for CT abd to assess RLQ site as drain output is minimal, noted to be brown in color. ? NG tube placement for CT contrast if needed, renal MD discussed this w pt. Midline healing w no new drainage after starting clears. Pt's wt appears to be relatively stable. Gravity drain bag due to controlled SB. Started on clears 9/3, drank entire [employer] 9/5. Pt becoming less anxious about increasing PO intake, but w minimal appetite. +BS, flatus. BM on 9/5 Per primary MD, plan is to continue TPN until PO intake improves, noted to wean TPN 9/6 to increase appetite  Endo: CBGs well controlled. CBGs controlled on minimal SSI. On solu-cortef 20 daily (pred 5 daily PTA)  Lytes: Na low at 132 d/t overload (-3.7L after HD 9/5), K stable at 3.8 (K bath in HD). Hypercalcemia continues (corrected Ca 11.8, Ca x PO4 remains < 55). Mag 1.5, Phos 2.7. Renal MD replacing lytes.  Renal: ESRD with HD typically TTS. HD is currently 4x/week (MTTS) while pt requiring TPN adding to volume issues - Noted plan to try and cut back to T/T/S. If TPN continues, CSW trying to find LTAC HD bed  that can accommodate HD 4x/wk. On Aranesp qSat- dose decreased 9/4, now on hold with Hb 12.8.    Cards: BP variable. (HTN d/t volume, BP nml during HD). On scheduled labetolol, prn hydralazine  Hepatobil: LFTs WNL, except alk phos/Tbili elevated. Prealbumin remains at goal. Trig WNL 128  Neuro: GCS 20, no c/o pain - on fentanyl patch, prn pain meds.   ID: Fluconazole + Zosyn for peritonitis/abd abscess -- Noted primary team plans to cont abx thru 9/16, then re-CT abd and consult with ID for further recs on abx duration. Afeb, WBC WNL. Abd abscess culture: candida tropicalis.   Zosyn 8.1>>  Diflucan 8.16>> (s/p 14 d mycamine)  Heme/Onc: Hgb up to 12.8 on Aranesp q Saturday (on hold).  Best Practices: Mouthcare, PPI po  Plan:  1.  Decrease Clinimix 5/15 (no lytes) to 45 ml/hr to try to increase PO drive per MD, this will provide 54 g protein/d (68% of goal) and 874 avg kcal/d (50% of goal). MD/RD aware TPN not meeting kcal goals with Clinimix alone. Pt appears to be maintaining weight and prealbumin on previous regimen, now w slowly increasing PO intake. 2. Will defer electrolyte replacement to renal MD 3. F/u CBGs with decrease in caloric intake from TPN  4. Provide available trace elements, MVI and lipids @5cc /hr on MWF only d/t national backorder 5. F/up PO intake to assess future TPN needs 6. F/up CT results  Thank you for the consult.  Tomi Bamberger, PharmD Clinical Pharmacist Pager: [phone] Pharmacy: [phone] 05/28/2012 10:59 AM

## 2012-05-28 NOTE — Progress Notes (Signed)
Subjective:  Sitting up in bed, no current complaints, but appears very weak.   Objective: Vital signs in last 24 hours: Temp:  [97.2 F (36.2 C)-98.2 F (36.8 C)] 98 F (36.7 C) (09/06 0527) Pulse Rate:  [64-96] 64  (09/06 0527) Resp:  [15-20] 20  (09/06 0527) BP: (99-154)/(64-91) 131/66 mmHg (09/06 0527) SpO2:  [92 %-100 %] 99 % (09/06 0527) Weight:  [46.9 kg (103 lb 6.3 oz)] 46.9 kg (103 lb 6.3 oz) (09/05 2026) Weight change: -2.996 kg (-6 lb 9.7 oz)  Intake/Output from previous day: 09/05 0701 - 09/06 0700 In: 2715 [P.O.:230; I.V.:270; IV Piggyback:360; TPN:1850] Out: 3784    EXAM: General appearance:  Cachetic, alert, in no apparent distress Resp:  CTA without rales, rhonchi, or wheezes Cardio:  RRR with Gr I/VI systolic murmur GI:  + BS, soft and nontender, dressing intact Extremities:  No edema Access:  AVG @ right thigh with + bruit Rt fem Temp HD cath  Lab Results:  Basename 05/28/12 0555 05/27/12 0648  WBC 6.3 7.2  HGB 12.8* 12.3*  HCT 38.8* 36.5*  PLT 120* 149*   BMET:  Basename 05/28/12 0555 05/27/12 0649 05/25/12 0907  NA 132* 128* --  K 3.8 3.4* --  CL 93* 91* --  CO2 26 19 --  GLUCOSE 104* 100* --  BUN 41* 61* --  CREATININE 4.16* 5.35* --  CALCIUM 10.0 9.9 --  ALBUMIN -- 1.6*1.7* 1.5*    Basename 05/25/12 0907  PTH 142.9*   Iron Studies: No results found for this basename: IRON,TIBC,TRANSFERRIN,FERRITIN in the last 72 hours  Assessment/Plan: 1. SBO - s/p surg/dehisc/abscess with controlled EC fistula and pelvic drain, CCS managing on PO liquids and IV Zosyn & Diflucan, still requiring TNA 2. ESRD - HD on TTS @ [facility], no Hep HD per surg request; K 3.8 yesterday on 4 K bath. 3. Anemia - Hgb 12.8 trending up, holding aranesp / no iron. 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 5. HTN/volume - BP most recently 131/66, IV metoprolol 5 mg q 6 hours; volume stable now with hd 3x per  wk.  6. Malnutrition - on TNA, Alb 1.6, but prealbumin 23.4 (8/26). 7. Gout - stable, on IV Solu-Cortef.   LOS: 56 days   [name],CHARLES 05/28/2012,7:35 AM I have seen and examined this patient and agree with plan per Gerome Apley.  Pt cannot get up due to temp HD cath and drainage cath in Rt groin.  He needs to start eating so we can stop the TNA.  He seems to be agreeable to trying Nepro. Plan HD in AM.. Esiquio Boesen T,MD 05/28/2012 10:47 AM

## 2012-05-29 ENCOUNTER — Inpatient Hospital Stay (HOSPITAL_COMMUNITY): Payer: Medicare Other

## 2012-05-29 ENCOUNTER — Inpatient Hospital Stay (HOSPITAL_COMMUNITY)
Admit: 2012-05-29 | Discharge: 2012-05-29 | Disposition: A | Discharge status: Home / Self Care | Payer: Medicare Other | Attending: Internal Medicine | Admitting: Internal Medicine

## 2012-05-29 LAB — GLUCOSE, CAPILLARY
Glucose-Capillary: 115 mg/dL — ABNORMAL HIGH (ref 70–99)
Glucose-Capillary: 127 mg/dL — ABNORMAL HIGH (ref 70–99)
Glucose-Capillary: 95 mg/dL (ref 70–99)

## 2012-05-29 LAB — RENAL FUNCTION PANEL
Albumin: 1.6 g/dL — ABNORMAL LOW (ref 3.5–5.2)
BUN: 72 mg/dL — ABNORMAL HIGH (ref 6–23)
CO2: 18 mEq/L — ABNORMAL LOW (ref 19–32)
Chloride: 90 mEq/L — ABNORMAL LOW (ref 96–112)
Glucose, Bld: 86 mg/dL (ref 70–99)
Potassium: 3.3 mEq/L — ABNORMAL LOW (ref 3.5–5.1)

## 2012-05-29 LAB — CBC
HCT: 36.5 % — ABNORMAL LOW (ref 39.0–52.0)
Hemoglobin: 12.5 g/dL — ABNORMAL LOW (ref 13.0–17.0)
MCH: 27.7 pg (ref 26.0–34.0)
MCHC: 34.2 g/dL (ref 30.0–36.0)
RBC: 4.51 MIL/uL (ref 4.22–5.81)

## 2012-05-29 MED ORDER — IOHEXOL 300 MG/ML  SOLN
100.0000 mL | Freq: Once | INTRAMUSCULAR | Status: AC | PRN
  Administered 2012-05-29: 100 mL via INTRAVENOUS

## 2012-05-29 MED ORDER — INSULIN REGULAR HUMAN 100 UNIT/ML IJ SOLN
INTRAVENOUS | Status: AC
  Administered 2012-05-29: 17:00:00 via INTRAVENOUS
  Filled 2012-05-29: qty 2000

## 2012-05-29 MED ORDER — IOHEXOL 300 MG/ML  SOLN
20.0000 mL | INTRAMUSCULAR | Status: AC
  Administered 2012-05-29 (×2): 20 mL via ORAL

## 2012-05-29 NOTE — Progress Notes (Signed)
Patient ID: [name], male   DOB: 1961-09-18, 51 y.o.   MRN: [id number]  [facility] PROGRESS NOTE  [name]:[id number] DOB: July 31, 1961 DOA: 04/02/2012 PCP: Trevor Iha, MD  Brief narrative:  51 year old male with past medical history of ESRD (MWF), s/p renal transplant about 17 years ago, prostatectomy in 2012, admitted 04/02/2012 with abdominal pain, nausea and vomiting. Work up done indicated SBO and pt has required Ex Lap for lysis of adhesions. He subsequently had an anastomotic leak with an abscess which has necessitated an omental patching of the anastomosis and percutaneous drain placement. The patient had an elevation of his white blood cell count and CT of the abdomen revealed an enlarging abscess an enteric fistula was obstructed drainage tube necessitated repositioning of the percutaneous drain a percutaneous drain. Presently the patient has an open abdominal wound which is connected with the abscess cavity and is being managed by general surgery and wound ostomy care.   Principal Problem:  *SBO (small bowel obstruction)  - patient is s/p exploratory laparotomy and lysis of adhesions and partial small bowel x 2 on 7/24.  - Patient has had subsequent anastomotic leak- omental patch of anastomosis on 8/2.  - On 8/8, CT abdomen and pelvis demonstrated several fluid collections consistent with abscess, largest measuring 16.5 x 13.7 x 6.4 cm.  - Patient is status post percutaneous drainage of largest fluid collection 04/30/2012. Repeat CT done for increasing WBC count on 8/13 revealed enlarging abscess and an enteric fistula with obstructed drainage tube. The drain was repositioned on 8/14. Since that time, he has had an open abdominal wound which connects to an abscess cavity, which has been slowly closing.  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  - Patient should not bend the right hip per interventional radiology.   Chronic nonocclusive left lower extremity DVT.  - resolved  - Diagnosed via Doppler 04/30/2012 and follow up on 8/21 showed no evidence of DVT, superficial thrombosis or baker's cyst.   CKD (chronic kidney disease) stage V requiring chronic dialysis  - Dialysis per nephrology schedule   HTN (hypertension),  - BP 143/75  - Continue to monitor vitals per floor protocol   Anemia due to chronic disease  - Stable  - Has received a total of 4 units of packed red blood cells this admission  - Continue aranesp and ferric gluconate   GOUT  - Patient had been on pred 5mg  daily. Weaned IV hydrocortisone. Currently at 25 mg daily.  - Wean to 20mg  daily 9/2 (equivalent to pred 5) and will continue until tolerating PO   Leg graft occlusion  - s/p thrombectomy 7/25  - unfortunately re clotted and undergone an additional thrombectomy procedure with revision of arterial and graft on 04/27/2012.   Severe protein calorie malnutrition  - Patient is cachectic  - Parenteral nutrition managed  by surgery and pharmacy.   Code Status: Full  Family Communication: Pt at bedside over 60 minutes and sister over the phone  Disposition Plan: Pending LTAC bed vs SNF vs home   Consultants:  Surgery  Nephrology Antibiotics:  Zosyn 05/20/2012 -->  HPI/Subjective: No events overnight.    Objective: Filed Vitals:   05/28/12 2029 05/29/12 0336 05/29/12 0537 05/29/12 0924  BP: 178/84 171/86 163/77 173/74  Pulse: 72 77 77 69  Temp: 98.4 F (36.9 C)  98.2 F (36.8 C) 98 F (36.7 C)  TempSrc: Oral  Oral Oral  Resp: 16 18 16 17   Height: 5\' 11"  (1.803 m)     Weight: 46.9 kg (103 lb 6.3 oz)     SpO2: 100%  99% 98%    Intake/Output Summary (Last 24 hours) at 05/29/12 1402 Last data filed at 05/29/12 0900  Gross per 24 hour  Intake 2122.49 ml  Output      0 ml  Net 2122.49 ml    Exam:   General:  Pt is alert, follows commands appropriately, not in acute distress  Cardiovascular: Regular rate and rhythm, S1/S2, no murmurs, no rubs, no gallops  Respiratory: Clear to auscultation bilaterally, no wheezing, no crackles, no rhonchi  Abdomen: Soft, non tender, non distended, bowel sounds present, no guarding  Extremities: No edema, pulses DP and PT palpable bilaterally  Neuro: Grossly nonfocal  Data Reviewed: Basic Metabolic Panel:  Lab 05/29/12 1610 05/28/12 0555 05/27/12 0649 05/25/12 0907 05/24/12 0757 05/24/12 0530  NA 127* 132* 128* 128* 127* --  K 3.3* 3.8 3.4* 3.5 3.5 --  CL 90* 93* 91* 90* 91* --  CO2 18* 26 19 24 19  --  GLUCOSE 86 104* 100* 97 122* --  BUN 72* 41* 61* 50* 77* --  CREATININE 5.76* 4.16* 5.35* 4.85* 6.41* --  CALCIUM 10.0 10.0 9.9 9.5 9.7 --  MG -- -- 1.5 -- -- 1.5  PHOS 3.1 -- 2.7 -- -- 2.5   Liver Function Tests:  Lab 05/29/12 0953 05/27/12 0649 05/25/12 0907 05/24/12 0530  AST -- 42* 36 28  ALT -- 37 30 30  ALKPHOS -- 170* 161* 159*  BILITOT -- 1.6* 1.6* 1.9*  PROT -- 5.6* 5.2* 5.3*  ALBUMIN 1.6* 1.6*1.7* 1.5* 1.6*   No results found for this basename: LIPASE:5,AMYLASE:5 in the last 168 hours No results found for this basename: AMMONIA:5 in the last 168 hours CBC:  Lab 05/29/12 0953 05/28/12 0555 05/27/12 0648 05/25/12 0907 05/24/12 0530  WBC 7.0 6.3 7.2 6.2 7.4  NEUTROABS -- -- -- -- 4.9  HGB 12.5* 12.8* 12.3*  11.9* 11.9*  HCT 36.5* 38.8* 36.5* 34.9* 35.3*  MCV 80.9 82.6 81.7 81.5 82.7  PLT 134* 120* 149* 110* 117*   CBG:  Lab 05/29/12 0800 05/29/12 0010 05/28/12 1705 05/28/12 0823 05/27/12 2346  GLUCAP 95 115* 134* 91 115*    No results found for this or any previous visit (from the past 240 hour(s)).   Scheduled Meds:   . antiseptic oral rinse  15 mL Mouth Rinse q12n4p  . feeding supplement (NEPRO CARB STEADY)  237 mL Oral TID BM  . feeding supplement  1 Container Oral Q24H  . fentaNYL  37.5 mcg Transdermal Q72H  . fluconazole (DIFLUCAN) IV  400 mg Intravenous Q T,Th,Sa-HD  . heparin  40 Units/kg Dialysis Once in dialysis  . hydrocortisone sod succinate (SOLU-CORTEF) injection  20 mg Intravenous Daily  . insulin aspart  0-15 Units Subcutaneous Q8H  .  iohexol  20 mL Oral Q1 Hr x 2  . labetalol  10 mg Intravenous Q6H  . lip balm   Topical BID  . pantoprazole (PROTONIX) IV  40 mg Intravenous Daily  . piperacillin-tazobactam (ZOSYN)  IV  2.25 g Intravenous Q8H  . simethicone  80 mg Oral QID  . sodium chloride  10-40 mL Intracatheter Q12H   Continuous Infusions:   . fat emulsion 240 mL (05/28/12 1705)  . TPN (CLINIMIX) +/- additives 70 mL/hr at 05/27/12 1726  . TPN (CLINIMIX) +/- additives 45 mL/hr at 05/28/12 1705  . TPN [facility]) +/- additives       Debbora Presto, MD  [facility] Pager [phone]  If 7PM-7AM, please contact night-coverage [email or web address] Password [email or web address] 05/29/2012, 2:02 PM   LOS: 57 days

## 2012-05-29 NOTE — Progress Notes (Signed)
Clinical Social Work Department CLINICAL SOCIAL WORK PLACEMENT NOTE 05/29/2012  Patient:  [name], [name]  Account Number:  [id number] Admit date:  04/02/2012  Clinical Social Worker:  Genelle Bal, LCSW  Date/time:  05/28/2012 08:29 AM  Clinical Social Work is seeking post-discharge placement for this patient at the following level of care:   SKILLED NURSING   (*CSW will update this form in Epic as items are completed)   05/28/2012  Patient/family provided with [facility] Department of Clinical Social Work's list of facilities offering this level of care within the geographic area requested by the patient (or if unable, by the patient's family).  05/28/2012  Patient/family informed of their freedom to choose among providers that offer the needed level of care, that participate in Medicare, Medicaid or managed care program needed by the patient, have an available bed and are willing to accept the patient.  05/28/2012  Patient/family informed of [facility]' ownership interest in [facility], as well as of the fact that they are under no obligation to receive care at this facility.  PASARR submitted to EDS on  PASARR number received from EDS on   FL2 transmitted to all facilities in geographic area requested by pt/family on  05/29/2012 FL2 transmitted to all facilities within larger geographic area on   Patient informed that his/her managed care company has contracts with or will negotiate with  certain facilities, including the following:     Patient/family informed of bed offers received:   Patient chooses bed at  Physician recommends and patient chooses bed at    Patient to be transferred to  on   Patient to be transferred to facility by   The following physician request were entered in Epic:   Additional Comments:  Lia Foyer, LCSWA [facility] Clinical Social Worker Contact #: [phone] (weekend)

## 2012-05-29 NOTE — Progress Notes (Addendum)
32 Days Post-Op  Subjective: Pt without acute changes  Objective: Vital signs in last 24 hours: Temp:  [97.7 F (36.5 C)-98.4 F (36.9 C)] 98 F (36.7 C) (09/07 0924) Pulse Rate:  [69-77] 69  (09/07 0924) Resp:  [16-18] 17  (09/07 0924) BP: (143-178)/(74-87) 173/74 mmHg (09/07 0924) SpO2:  [98 %-100 %] 98 % (09/07 0924) Weight:  [103 lb 6.3 oz (46.9 kg)] 103 lb 6.3 oz (46.9 kg) (09/06 2029) Last BM Date: 05/28/12  Intake/Output from previous day: 09/06 0701 - 09/07 0700 In: 2127.5 [P.O.:250; I.V.:110; IV Piggyback:150; TPN:1602.5] Out: -  Intake/Output this shift:    RLQ drain intact, liquid stool/air in bag; drain flushed with return of brown fluid/stool and air; insertion site ok, sl tender  Lab Results:   Basename 05/28/12 0555 05/27/12 0648  WBC 6.3 7.2  HGB 12.8* 12.3*  HCT 38.8* 36.5*  PLT 120* 149*   BMET  Basename 05/28/12 0555 05/27/12 0649  NA 132* 128*  K 3.8 3.4*  CL 93* 91*  CO2 26 19  GLUCOSE 104* 100*  BUN 41* 61*  CREATININE 4.16* 5.35*  CALCIUM 10.0 9.9   PT/INR No results found for this basename: LABPROT:2,INR:2 in the last 72 hours ABG No results found for this basename: PHART:2,PCO2:2,PO2:2,HCO3:2 in the last 72 hours  Studies/Results: No results found.  Anti-infectives: Anti-infectives     Start     Dose/Rate Route Frequency Ordered Stop   05/22/12 1200   fluconazole (DIFLUCAN) IVPB 400 mg        400 mg 200 mL/hr over 60 Minutes Intravenous Every T-Th-Sa (Hemodialysis) 05/21/12 1108     05/20/12 2000   fluconazole (DIFLUCAN) IVPB 400 mg  Status:  Discontinued        400 mg 200 mL/hr over 60 Minutes Intravenous Once per day on Mon Tue Thu Sat 05/19/12 1417 05/21/12 1108   05/20/12 1700  piperacillin-tazobactam (ZOSYN) IVPB 2.25 g       2.25 g 100 mL/hr over 30 Minutes Intravenous Every 8 hours 05/20/12 1634     05/17/12 1600   fluconazole (DIFLUCAN) IVPB 200 mg        200 mg 100 mL/hr over 60 Minutes Intravenous  Once  05/17/12 1129 05/17/12 1940   05/08/12 1200   fluconazole (DIFLUCAN) IVPB 400 mg  Status:  Discontinued        400 mg 200 mL/hr over 60 Minutes Intravenous Every T-Th-Sa (Hemodialysis) 05/07/12 1752 05/19/12 1417   04/30/12 1400   vancomycin (VANCOCIN) 750 mg in sodium chloride 0.9 % 150 mL IVPB  Status:  Discontinued        750 mg 150 mL/hr over 60 Minutes Intravenous  Once 04/30/12 1212 04/30/12 1507   04/29/12 2330   vancomycin (VANCOCIN) 1,500 mg in sodium chloride 0.9 % 500 mL IVPB        1,500 mg 250 mL/hr over 120 Minutes Intravenous  Once 04/29/12 2259 04/30/12 0246   04/27/12 0000   ceFAZolin (ANCEF) IVPB 1 g/50 mL premix  Status:  Discontinued     Comments: Send with pt to OR      1 g 100 mL/hr over 30 Minutes Intravenous On call 04/26/12 0927 04/26/12 0928   04/24/12 1000   micafungin (MYCAMINE) 100 mg in sodium chloride 0.9 % 100 mL IVPB  Status:  Discontinued        100 mg 100 mL/hr over 1 Hours Intravenous Daily 04/24/12 0750 05/07/12 1713   04/23/12 1600   fluconazole (DIFLUCAN)  IVPB 400 mg        400 mg 200 mL/hr over 60 Minutes Intravenous  Once 04/23/12 1428 04/23/12 1800   04/22/12 2200   piperacillin-tazobactam (ZOSYN) IVPB 3.375 g  Status:  Discontinued        3.375 g 100 mL/hr over 30 Minutes Intravenous 3 times per day 04/22/12 2120 04/22/12 2137   04/22/12 2200   piperacillin-tazobactam (ZOSYN) IVPB 2.25 g  Status:  Discontinued        2.25 g 100 mL/hr over 30 Minutes Intravenous 3 times per day 04/22/12 2140 05/20/12 0748   04/22/12 2157   gentamicin (GARAMYCIN) injection  Status:  Discontinued          As needed 04/22/12 2158 04/22/12 2335   04/22/12 2156   clindamycin (CLEOCIN) injection  Status:  Discontinued          As needed 04/22/12 2157 04/22/12 2335   04/22/12 2145  piperacillin-tazobactam (ZOSYN) IVPB 3.375 g       3.375 g 12.5 mL/hr over 240 Minutes Intravenous  Once 04/22/12 2141 04/23/12 0145   04/22/12 2130   gentamicin (GARAMYCIN)  240 mg, clindamycin (CLEOCIN) 900 mg in sodium chloride irrigation 0.9 % 1,000 mL irrigation  Status:  Discontinued         Irrigation  Once 04/22/12 2119 04/23/12 0020   04/14/12 0830   ertapenem (INVANZ) 1 g in sodium chloride 0.9 % 50 mL IVPB        1 g 100 mL/hr over 30 Minutes Intravenous To Surgery 04/14/12 0816 04/14/12 0834   04/13/12 1359   ertapenem (INVANZ) 1 g in sodium chloride 0.9 % 50 mL IVPB  Status:  Discontinued        1 g 100 mL/hr over 30 Minutes Intravenous 60 min pre-op 04/13/12 1359 04/14/12 0816          Assessment/Plan: s/p SBR with anast leak and pelvic abscess with fistula; needs f/u CT with IV contrast and drain injection to assess fistula and adequacy of drainage   LOS: 57 days    Jazilyn Siegenthaler,D [facility] 05/29/2012

## 2012-05-29 NOTE — Progress Notes (Signed)
Agree with above, ct looks much better.

## 2012-05-29 NOTE — Progress Notes (Signed)
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 Allopurinol     REACTION: decreased platelets  . Aspirin     REACTION: unspecified    Patient Measurements: Height: 5\' 11"  (180.3 cm) Weight: 103 lb 6.3 oz (46.9 kg) IBW/kg (Calculated) : 75.3  Adjusted Body Weight:  Usual Weight:   Vital Signs: Temp: 98.2 F (36.8 C) (09/07 0537) Temp src: Oral (09/07 0537) BP: 163/77 mmHg (09/07 0537) Pulse Rate: 77  (09/07 0537) Intake/Output from previous day: 09/06 0701 - 09/07 0700 In: 2127.5 [P.O.:250; I.V.:110; IV Piggyback:150; TPN:1602.5] Out: -  Intake/Output from this shift:    Labs:  [facility] 05/28/12 0555 05/27/12 0648  WBC 6.3 7.2  HGB 12.8* 12.3*  HCT 38.8* 36.5*  PLT 120* 149*  APTT -- --  INR -- --     Basename 05/28/12 0555 05/27/12 0649  NA 132* 128*  K 3.8 3.4*  CL 93* 91*  CO2 26 19  GLUCOSE 104* 100*  BUN 41* 61*  CREATININE 4.16* 5.35*  LABCREA -- --  CREAT24HRUR -- --  CALCIUM 10.0 9.9  MG -- 1.5  PHOS -- 2.7  PROT -- 5.6*  ALBUMIN -- 1.6*1.7*  AST -- 42*  ALT -- 37  ALKPHOS -- 170*  BILITOT -- 1.6*  BILIDIR -- 1.1*  IBILI -- 0.5  PREALBUMIN -- --  TRIG -- --  CHOLHDL -- --  CHOL -- --   Estimated Creatinine Clearance: 13.8 ml/min (by C-G formula based on Cr of 4.16).    Basename 05/29/12 0800 05/29/12 0010 05/28/12 1705  GLUCAP 95 115* 134*   Insulin Requirements in the past 24 hours:  2 units SSI + 15units in TPN  Current Nutrition:  Full liquid diet  Nutritional Goals:  1700-1950 kCal, 80-100 grams of protein per day   Assessment: Admitted 7/13 with abd pain, weight loss, SBO. S/p LOA, SBR 7/24 and repeat LOA, SBR with anasomotic leak repair 8/1, thrombectomy with AVG revision 7/25 and repeat thrombectomy 8/6. 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  GI: Tolerating TPN. Pt unable to tolerate CT contrast 9/3 -plans for CT abd to  assess RLQ site as drain output is minimal, noted to be brown in color. ? NG tube placement for CT contrast if needed, renal MD discussed this w pt. Midline healing w no new drainage after starting clears. Pt's wt appears to be relatively stable. Gravity drain bag due to controlled SB. Now on Fulls- tolerated Nepro, Creamed Chicken but feels full quickly.  Pt becoming less anxious about increasing PO intake, but w minimal appetite. +BS, flatus. BM on 9/5  Per primary MD, plan is to continue TPN until PO intake improves, noted to wean TPN 9/6 to increase appetite  Endo: CBGs 95-115 since rate decrease &  minimal SSI. On solu-cortef 20 daily (equiv pred 5 daily PTA)  Lytes: No labs today.  Yest- Na low at 132 d/t overload (-3.7L after HD 9/5), K stable at 3.8 (K bath in HD). Hypercalcemia continues (corrected Ca 11.8, Ca x PO4 remains < 55). Mag 1.5, Phos 2.7. Renal MD replacing lytes.  Renal: ESRD with HD typically TTS. HD is currently 4x/week (MTTS) while pt requiring TPN adding to volume issues - Noted plan to try and cut back to T/T/S. If TPN continues, CSW trying to find LTAC HD bed that can accommodate HD 4x/wk. On Aranesp qSat- dose decreased 9/4, now on  hold with Hb 12.8.  Cards: BP variable. (HTN d/t volume, BP nml during HD). On scheduled labetolol, prn hydralazine  Hepatobil: LFTs WNL, except alk phos/Tbili elevated. Prealbumin remains at goal. Trig WNL 128  Neuro: GCS 20, no c/o pain - on fentanyl patch, prn pain meds.  ID: Fluconazole + Zosyn for peritonitis/abd abscess -- Noted primary team plans to cont abx thru 9/16, then re-CT abd and consult with ID for further recs on abx duration. Afeb, WBC WNL. Abd abscess culture: candida tropicalis.  Zosyn 8.1>>  Diflucan 8.16>> (s/p 14 d mycamine)  Heme/Onc: Hgb up to 12.8 on Aranesp q Saturday (on hold).  Best Practices: Mouthcare, PPI po   Plan:  1. Cont Clinimix 5/15 (no lytes)  45 ml/hr to try to increase PO drive per MD, this will provide 54  g protein/d (68% of goal) and 874 avg kcal/d (50% of goal). MD/RD aware TPN not meeting kcal goals with Clinimix alone. Pt appears to be maintaining weight and prealbumin on previous regimen, now w slowly increasing PO intake.  2. Electrolyte replacement to renal MD  3. F/u CBGs with decrease in caloric intake from TPN  4. Provide available trace elements, MVI and lipids @5cc /hr on MWF only d/t national backorder  5. F/up PO intake to assess future TPN needs   Marisue Humble, PharmD Clinical Pharmacist [facility]

## 2012-05-29 NOTE — Progress Notes (Signed)
Subjective:   No current complaints, but appears very frustrated with slow progress; tolerated Nepro and creamed chicken, but fills quickly.  Objective: Vital signs in last 24 hours: Temp:  [97.7 F (36.5 C)-98.4 F (36.9 C)] 98.2 F (36.8 C) (09/07 0537) Pulse Rate:  [67-77] 77  (09/07 0537) Resp:  [16-18] 16  (09/07 0537) BP: (143-178)/(75-87) 163/77 mmHg (09/07 0537) SpO2:  [99 %-100 %] 99 % (09/07 0537) Weight:  [46.9 kg (103 lb 6.3 oz)] 46.9 kg (103 lb 6.3 oz) (09/06 2029) Weight change: 0 kg (0 lb)  Intake/Output from previous day: 09/06 0701 - 09/07 0700 In: 2127.5 [P.O.:250; I.V.:110; IV Piggyback:150; TPN:1602.5] Out: -    EXAM: General appearance:  Cachetic, alert, in no apparent distress Resp:  CTA without rales, rhonchi, or wheezes Cardio:  RRR with Gr II/VI systolic murmur GI:  + BS, soft and nontender, dressing intact Extremities:  No edema Access:  AVG @ right thigh with + bruit  Lab Results:  Basename 05/28/12 0555 05/27/12 0648  WBC 6.3 7.2  HGB 12.8* 12.3*  HCT 38.8* 36.5*  PLT 120* 149*   BMET:  Basename 05/28/12 0555 05/27/12 0649  NA 132* 128*  K 3.8 3.4*  CL 93* 91*  CO2 26 19  GLUCOSE 104* 100*  BUN 41* 61*  CREATININE 4.16* 5.35*  CALCIUM 10.0 9.9  ALBUMIN -- 1.6*1.7*   No results found for this basename: PTH:2 in the last 72 hours Iron Studies: No results found for this basename: IRON,TIBC,TRANSFERRIN,FERRITIN in the last 72 hours  Assessment/Plan:  1. SBO - s/p surg/dehisc/abscess with controlled EC fistula and pelvic drain, CCS managing on PO liquids and IV Zosyn & Diflucan, still requiring TNA. 2. ESRD - HD on TTS @ [facility], no Hep HD per surg request; K 3.8 yesterday (on 4 K bath).  HD pending today. 3. Anemia - Hgb 12.8 trending up, holding aranesp / no iron. 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 5. HTN/volume - BP most recently 163/77, IV labetalol 10 mg q 6 hours; volume  stable now with hd 3x per wk.  6. Malnutrition - on TNA, Alb 1.6, but prealbumin 23.4 (8/26). 7. Gout - stable, on IV Solu-Cortef.  LOS: 57 days   [name],CHARLES 05/29/2012,7:43 AM  Patient seen and examined and agree with assessment and plan as above.  Vinson Moselle  MD [employer] [phone] pgr    [phone] cell 05/29/2012, 2:36 PM

## 2012-05-29 NOTE — Progress Notes (Signed)
Patient ID: [name], male   DOB: [birth year], 51 y.o.   MRN: [id number] Patient ID: [name], male   DOB: 16-Dec-1960, 54 y.o.   MRN: [id number] 32 Days Post-Op  Subjective: Still only able to take small amounts of POs due to feeling full but no nausea or vomiting. + small BMs and lots of gas.    Objective: Vital signs in last 24 hours: Temp:  [98 F (36.7 C)-98.4 F (36.9 C)] 98 F (36.7 C) (09/07 0924) Pulse Rate:  [69-77] 69  (09/07 0924) Resp:  [16-18] 17  (09/07 0924) BP: (163-178)/(74-87) 173/74 mmHg (09/07 0924) SpO2:  [98 %-100 %] 98 % (09/07 0924) Weight:  [103 lb 6.3 oz (46.9 kg)] 103 lb 6.3 oz (46.9 kg) (09/06 2029) Last BM Date: 05/28/12  Intake/Output from previous day: 09/06 0701 - 09/07 0700 In: 2127.5 [P.O.:250; I.V.:110; IV Piggyback:150; TPN:1602.5] Out: -  Intake/Output this shift:    General appearance: alert, cooperative, no distress and on dialysis GI: soft, NT, ND, midline wound with granulation tissue and some fibrinous exudate, exposed sutures and no drainage. RLQ drain with minimal output WBC wnl, afebrile, VSS.   Lab Results:   [facility] 05/29/12 0953 05/28/12 0555  WBC 7.0 6.3  HGB 12.5* 12.8*  HCT 36.5* 38.8*  PLT 134* 120*   BMET  Basename 05/29/12 0953 05/28/12 0555  NA 127* 132*  K 3.3* 3.8  CL 90* 93*  CO2 18* 26  GLUCOSE 86 104*  BUN 72* 41*  CREATININE 5.76* 4.16*  CALCIUM 10.0 10.0   PT/INR No results found for this basename: LABPROT:2,INR:2 in the last 72 hours ABG No results found for this basename: PHART:2,PCO2:2,PO2:2,HCO3:2 in the last 72 hours  Studies/Results: No results found.  Anti-infectives: Anti-infectives     Start     Dose/Rate Route Frequency Ordered Stop   05/22/12 1200   fluconazole (DIFLUCAN) IVPB 400 mg        400 mg 200 mL/hr over 60 Minutes Intravenous Every T-Th-Sa (Hemodialysis) 05/21/12 1108     05/20/12 2000   fluconazole (DIFLUCAN) IVPB 400 mg  Status:  Discontinued        400  mg 200 mL/hr over 60 Minutes Intravenous Once per day on Mon Tue Thu Sat 05/19/12 1417 05/21/12 1108   05/20/12 1700   piperacillin-tazobactam (ZOSYN) IVPB 2.25 g        2.25 g 100 mL/hr over 30 Minutes Intravenous Every 8 hours 05/20/12 1634     05/17/12 1600   fluconazole (DIFLUCAN) IVPB 200 mg        200 mg 100 mL/hr over 60 Minutes Intravenous  Once 05/17/12 1129 05/17/12 1940   05/08/12 1200   fluconazole (DIFLUCAN) IVPB 400 mg  Status:  Discontinued        400 mg 200 mL/hr over 60 Minutes Intravenous Every T-Th-Sa (Hemodialysis) 05/07/12 1752 05/19/12 1417   04/30/12 1400   vancomycin (VANCOCIN) 750 mg in sodium chloride 0.9 % 150 mL IVPB  Status:  Discontinued        750 mg 150 mL/hr over 60 Minutes Intravenous  Once 04/30/12 1212 04/30/12 1507   04/29/12 2330   vancomycin (VANCOCIN) 1,500 mg in sodium chloride 0.9 % 500 mL IVPB        1,500 mg 250 mL/hr over 120 Minutes Intravenous  Once 04/29/12 2259 04/30/12 0246   04/27/12 0000   ceFAZolin (ANCEF) IVPB 1 g/50 mL premix  Status:  Discontinued     Comments: Send  with pt to OR      1 g 100 mL/hr over 30 Minutes Intravenous On call 04/26/12 0927 04/26/12 0928   04/24/12 1000   micafungin (MYCAMINE) 100 mg in sodium chloride 0.9 % 100 mL IVPB  Status:  Discontinued        100 mg 100 mL/hr over 1 Hours Intravenous Daily 04/24/12 0750 05/07/12 1713   04/23/12 1600   fluconazole (DIFLUCAN) IVPB 400 mg        400 mg 200 mL/hr over 60 Minutes Intravenous  Once 04/23/12 1428 04/23/12 1800   04/22/12 2200   piperacillin-tazobactam (ZOSYN) IVPB 3.375 g  Status:  Discontinued        3.375 g 100 mL/hr over 30 Minutes Intravenous 3 times per day 04/22/12 2120 04/22/12 2137   04/22/12 2200   piperacillin-tazobactam (ZOSYN) IVPB 2.25 g  Status:  Discontinued        2.25 g 100 mL/hr over 30 Minutes Intravenous 3 times per day 04/22/12 2140 05/20/12 0748   04/22/12 2157   gentamicin (GARAMYCIN) injection  Status:  Discontinued           As needed 04/22/12 2158 04/22/12 2335   04/22/12 2156   clindamycin (CLEOCIN) injection  Status:  Discontinued          As needed 04/22/12 2157 04/22/12 2335   04/22/12 2145   piperacillin-tazobactam (ZOSYN) IVPB 3.375 g        3.375 g 12.5 mL/hr over 240 Minutes Intravenous  Once 04/22/12 2141 04/23/12 0145   04/22/12 2130   gentamicin (GARAMYCIN) 240 mg, clindamycin (CLEOCIN) 900 mg in sodium chloride irrigation 0.9 % 1,000 mL irrigation  Status:  Discontinued         Irrigation  Once 04/22/12 2119 04/23/12 0020   04/14/12 0830   ertapenem (INVANZ) 1 g in sodium chloride 0.9 % 50 mL IVPB        1 g 100 mL/hr over 30 Minutes Intravenous To Surgery 04/14/12 0816 04/14/12 0834   04/13/12 1359   ertapenem (INVANZ) 1 g in sodium chloride 0.9 % 50 mL IVPB  Status:  Discontinued        1 g 100 mL/hr over 30 Minutes Intravenous 60 min pre-op 04/13/12 1359 04/14/12 0816          Assessment/Plan: s/p Procedure(s) (LRB) with comments: THROMBECTOMY ARTERIOVENOUS GORE-TEX GRAFT (Left) - Thrombectomy of left thigh arteriovenous gortex graft REVISION OF ARTERIOVENOUS GORETEX GRAFT (Left) - using 67mmx10cm Gore-Tex Vascular Graft SBO s/p el/loa and SBR x2 with EC fistula and leak: EC fistula on midline needs to have wet to dry dressing changes, will continue drain on right.  Biggest issue for patient right now is the fact that he has been bed bound due to PICC line in right groin.  He has a perm-cath in left groin which will not limit his activity level.  He has very limited access due to being burned out from dialysis.  Once his PO intake is better we can d/c TNA but this may be several weeks for him.   We will wean TPN as caloric intake allows. CT of abdomen and pelvis pending today.     LOS: 57 days    [name] 05/29/2012

## 2012-05-30 LAB — GLUCOSE, CAPILLARY: Glucose-Capillary: 91 mg/dL (ref 70–99)

## 2012-05-30 MED ORDER — INSULIN REGULAR HUMAN 100 UNIT/ML IJ SOLN
INTRAVENOUS | Status: AC
  Administered 2012-05-30: 18:00:00 via INTRAVENOUS
  Filled 2012-05-30: qty 2000

## 2012-05-30 NOTE — Progress Notes (Signed)
Subjective:   No current complaints, appears much better today.  Objective: Vital signs in last 24 hours: Temp:  [97.1 F (36.2 C)-98.7 F (37.1 C)] 98.7 F (37.1 C) (09/08 0530) Pulse Rate:  [69-97] 77  (09/08 0530) Resp:  [17-20] 18  (09/08 0530) BP: (107-181)/(69-88) 167/88 mmHg (09/08 0530) SpO2:  [97 %-100 %] 97 % (09/08 0530) Weight:  [51.9 kg (114 lb 6.7 oz)-52.5 kg (115 lb 11.9 oz)] 51.9 kg (114 lb 6.7 oz) (09/07 2122) Weight change: 5.6 kg (12 lb 5.5 oz)  Intake/Output from previous day: 09/07 0701 - 09/08 0700 In: 0  Out: 4003    EXAM: General appearance: Cachetic, alert, in no apparent distress Resp:  CTA without rales, rhonchi, or wheezes Cardio:  RRR with Gr II/VI systolic murmur GI:  + BS, soft and nontender, dressing intact Extremities:  No edema Access:  AVG @ right thigh with + bruit  Lab Results:  Basename 05/29/12 0953 05/28/12 0555  WBC 7.0 6.3  HGB 12.5* 12.8*  HCT 36.5* 38.8*  PLT 134* 120*   BMET:  Basename 05/29/12 0953 05/28/12 0555  NA 127* 132*  K 3.3* 3.8  CL 90* 93*  CO2 18* 26  GLUCOSE 86 104*  BUN 72* 41*  CREATININE 5.76* 4.16*  CALCIUM 10.0 10.0  ALBUMIN 1.6* --   No results found for this basename: PTH:2 in the last 72 hours Iron Studies: No results found for this basename: IRON,TIBC,TRANSFERRIN,FERRITIN in the last 72 hours  Assessment/Plan:  1. SBO - s/p surg/dehisc/abscess with controlled EC fistula and pelvic drain, CCS managing on PO liquids and IV Zosyn & Diflucan, still requiring TNA. 2. ESRD - HD on TTS @ [facility], no Hep HD per surg request; pre-HD K 3.3 yesterday (on 4 K bath).  Next HD on 9/10. 3. Anemia - Hgb 12.5, holding aranesp / no iron. 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 5. HTN/volume - BP most recently 167/88, IV labetalol 10 mg q 6 hours; volume stable now with hd 3x per wk.  6. Malnutrition - on TNA, Alb 1.6, but prealbumin 23.4 (8/26).  I am concerned  about limited mobility due to femoral catheter, and also about the suggestion or small area of possible clot on the tip of the catheter.  I think we should take out the large-bore dialysis catheter in the R groin, give the vein a 1 or 2 day holiday, then have IR reevaluate the upper extremities for a central or PICC line; then if that is not available, place a standard central line back into the R femoral vein, something that would not prohibit him from getting out of bed and walking.  7. Gout - stable, on IV Solu-Cortef.    LOS: 58 days   [name],[name] 05/30/2012,7:04 AM  Patient seen and examined and agree with assessment and plan as above with additions as indicated.  Stephen Moselle  MD [facility] [phone] pgr    [phone] cell 05/30/2012, 7:43 PM

## 2012-05-30 NOTE — Progress Notes (Signed)
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 Allopurinol     REACTION: decreased platelets  . Aspirin     REACTION: unspecified    Patient Measurements: Height: 5\' 11"  (180.3 cm) Weight: 114 lb 6.7 oz (51.9 kg) IBW/kg (Calculated) : 75.3  Adjusted Body Weight:  Usual Weight:   Vital Signs: Temp: 98.3 F (36.8 C) (09/08 0956) Temp src: Oral (09/08 0956) BP: 129/73 mmHg (09/08 0956) Pulse Rate: 76  (09/08 0956) Intake/Output from previous day: 09/07 0701 - 09/08 0700 In: 16109 [I.V.:19980; TPN:900] Out: 4003  Intake/Output from this shift: Total I/O In: 240 [P.O.:240] Out: -   Labs:  [facility] 05/29/12 0953 05/28/12 0555  WBC 7.0 6.3  HGB 12.5* 12.8*  HCT 36.5* 38.8*  PLT 134* 120*  APTT -- --  INR -- --     Basename 05/29/12 0953 05/28/12 0555  NA 127* 132*  K 3.3* 3.8  CL 90* 93*  CO2 18* 26  GLUCOSE 86 104*  BUN 72* 41*  CREATININE 5.76* 4.16*  LABCREA -- --  CREAT24HRUR -- --  CALCIUM 10.0 10.0  MG -- --  PHOS 3.1 --  PROT -- --  ALBUMIN 1.6* --  AST -- --  ALT -- --  ALKPHOS -- --  BILITOT -- --  BILIDIR -- --  IBILI -- --  PREALBUMIN -- --  TRIG -- --  CHOLHDL -- --  CHOL -- --   Estimated Creatinine Clearance: 11 ml/min (by C-G formula based on Cr of 5.76).    Basename 05/30/12 0755 05/29/12 2355 05/29/12 1552  GLUCAP 91 127* 109*   Insulin Requirements in the past 24 hours:  Moderate SSI- 2 units/24hr and 15units in TPN  Current Nutrition:  Full liquids  Nutritional Goals:  1700-1950 kCal, 80-100 grams of protein per day   Assessment: 7/24 and repeat LOA, SBR with anasomotic leak repair 8/1, thrombectomy with AVG revision 7/25 and repeat thrombectomy 8/6. 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  GI: Tolerating TPN.  Pt's wt appears to be relatively stable. Gravity drain bag due to controlled SB. Now on Fulls- tolerated Nepro,  Creamed Chicken but feels full quickly. Pt becoming less anxious about increasing PO intake, but w minimal appetite. (+)BMs, (+)gas.  Simethicone.  9/8: Plan wean TPN as calorie intake allows- may be several weeks. Endo: CBGs < 150 since rate decrease & minimal SSI. On solu-cortef 20 daily (equiv pred 5 daily PTA)  Lytes: No labs today. Na low at 127 d/t overload (-3.7L after HD 9/5), K stable at 3.3 (4K bath). Hypercalcemia continues (corrected Ca 11.9, Ca x PO4 remains < 55, Ca bath). Mag 1.5, Phos 3.1- improved. Renal MD replacing lytes, on no binders Renal: ESRD with HD typically TTS. HD is currently 4x/week (MTTS) while pt requiring TPN adding to volume issues - Noted plan to try and cut back to T/T/S. If TPN continues, CSW trying to find LTAC HD bed that can accommodate HD 4x/wk. On Aranesp qSat- dose decreased 9/4, now on hold with Hb 12.8.  Cards: BP variable. (HTN d/t volume, BP nml during HD). On scheduled labetolol, prn hydralazine  Hepatobil: LFTs WNL, except alk phos/Tbili elevated. Prealbumin remains at goal. Trig WNL 128  Neuro: GCS 20, no c/o pain - on fentanyl patch, prn pain meds.  ID: Fluconazole + Zosyn for peritonitis/abd abscess -- Noted primary team plans to cont abx thru 9/16,  then re-CT abd and consult with ID for further recs on abx duration. Afeb, WBC WNL. Abd abscess culture: candida tropicalis.  Zosyn 8.1>>  Diflucan 8.16>> (s/p 14 d mycamine)  Heme/Onc: Hgb up to 12.5 on Aranesp q Saturday (on hold).  Best Practices: Mouthcare, PPI po   Plan:  1. Cont Clinimix 5/15 (no lytes) 45 ml/hr to try to increase PO drive per MD, this will provide 54 g protein/d (68% of goal) and 874 avg kcal/d (50% of goal). MD/RD aware TPN not meeting kcal goals with Clinimix alone. Pt appears to be maintaining weight and prealbumin on previous regimen, now w slowly increasing PO intake.  2. Electrolyte replacement to renal MD  3. Provide available trace elements, MVI and lipids @5cc /hr on MWF  only d/t national backorder  4. F/up PO intake to assess future TPN needs   Marisue Humble, PharmD Clinical Pharmacist [facility]

## 2012-05-30 NOTE — Progress Notes (Signed)
33 Days Post-Op  Subjective: Says he feels a bit better, and maybe eating a bit more. No pain, comfortable  Objective: Vital signs in last 24 hours: Temp:  [97.1 F (36.2 C)-98.7 F (37.1 C)] 98.3 F (36.8 C) (09/08 0956) Pulse Rate:  [71-97] 76  (09/08 0956) Resp:  [18-20] 18  (09/08 0956) BP: (107-181)/(69-88) 129/73 mmHg (09/08 0956) SpO2:  [97 %-100 %] 98 % (09/08 0956) Weight:  [114 lb 6.7 oz (51.9 kg)-115 lb 11.9 oz (52.5 kg)] 114 lb 6.7 oz (51.9 kg) (09/07 2122)   Intake/Output from previous day: 09/07 0701 - 09/08 0700 In: 16109 [I.V.:19980; TPN:900] Out: 4003  Intake/Output this shift: Total I/O In: 240 [P.O.:240] Out: -    General appearance: alert, cachectic and no distress GI: soft, non-tender; bowel sounds normal; no masses,  no organomegaly  Incision: healing well, Small amount of persistant brownish drainage from abscess  Lab Results:   [facility] 05/29/12 0953 05/28/12 0555  WBC 7.0 6.3  HGB 12.5* 12.8*  HCT 36.5* 38.8*  PLT 134* 120*   BMET  Basename 05/29/12 0953 05/28/12 0555  NA 127* 132*  K 3.3* 3.8  CL 90* 93*  CO2 18* 26  GLUCOSE 86 104*  BUN 72* 41*  CREATININE 5.76* 4.16*  CALCIUM 10.0 10.0   PT/INR No results found for this basename: LABPROT:2,INR:2 in the last 72 hours ABG No results found for this basename: PHART:2,PCO2:2,PO2:2,HCO3:2 in the last 72 hours  MEDS, Scheduled    . antiseptic oral rinse  15 mL Mouth Rinse q12n4p  . feeding supplement (NEPRO CARB STEADY)  237 mL Oral TID BM  . feeding supplement  1 Container Oral Q24H  . fentaNYL  37.5 mcg Transdermal Q72H  . fluconazole (DIFLUCAN) IV  400 mg Intravenous Q T,Th,Sa-HD  . heparin  40 Units/kg Dialysis Once in dialysis  . hydrocortisone sod succinate (SOLU-CORTEF) injection  20 mg Intravenous Daily  . insulin aspart  0-15 Units Subcutaneous Q8H  . labetalol  10 mg Intravenous Q6H  . lip balm   Topical BID  . pantoprazole (PROTONIX) IV  40 mg Intravenous Daily  .  piperacillin-tazobactam (ZOSYN)  IV  2.25 g Intravenous Q8H  . simethicone  80 mg Oral QID  . sodium chloride  10-40 mL Intracatheter Q12H    Studies/Results: Ct Abdomen Pelvis W Contrast  05/29/2012  *RADIOLOGY REPORT*  Clinical Data: Evaluate abscess.  History of renal failure.  CT ABDOMEN AND PELVIS WITH CONTRAST  Technique:  Multidetector CT imaging of the abdomen and pelvis was performed following the standard protocol during bolus administration of intravenous contrast.  Contrast: OMNIPAQUE IOHEXOL 300 MG/ML  SOLN  Comparison: CT of the abdomen and pelvis 05/04/2012.  Findings:  Lung Bases: Small bilateral pleural effusions with dependent atelectasis in the lower lobes of the lungs bilaterally (left greater than right).  Atherosclerotic calcifications in the distal right coronary artery.  Abdomen/Pelvis:  Gallbladder is largely decompressed, but has a thickened enhancing wall.  This is nonspecific.  The appearance of the liver, pancreas, spleen and right adrenal gland is unremarkable.  There is a 1.8 x 1.1 cm nodule in the left adrenal gland which is unchanged.  Kidneys are atrophic bilaterally and there are numerous low attenuation lesions, most of which are too small to definitively characterize.  In addition, there is a 3.0 x 3.2 cm intermediate attenuation (28 HU) lesion in the interpolar region of the left kidney which is similar to recent prior examinations, but it is  new compared to more remote prior examinations (example 11/01/2004).  Compared to the prior examinations of previously noted right lower quadrant and its abscess appears to have nearly completely resolved.  There is a large drainage catheter which extends throughout the previously noted abscess cavity, and little to no residual fluid remains around the catheter at this time.  There is a small amount of gas associated with the tip of the catheter, presumably iatrogenic.  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  No significant volume of ascites.  No pneumoperitoneum.  No pathologic distension of bowel.  There is a right femoral approach dialysis catheter in place extending to the proximal right common iliac vein.  A small filling defect associated with the tip of the catheter could suggest some catheter associated thrombus (image 53 of series 2) extensive atherosclerosis of the abdominal and pelvic vasculature. Surgical clips in the low pelvis posterior to the urinary bladder.  Urinary bladder is nearly completely decompressed.  Musculoskeletal: Healing midline abdominal wound.  There are no aggressive appearing lytic or blastic lesions noted in the visualized portions of the skeleton.  IMPRESSION: 1.  The patient's right lower quadrant abscess is now essentially completely resolved.  A drainage catheter remains in place within the abscess cavity which is completely decompressed. 2.  There is there is a small filling defect associated with tip of the right femoral central venous catheter, which could represent catheter associated thrombus. 3.  A 3.2 x 3.0 cm intermediate attenuation left renal lesion may simply represent a proteinaceous cyst.  However, this appears to have slightly increased progressively over recent prior examinations, and is clearly new compared to more remote prior CT studies from 2006.  Further evaluation with renal ultrasound is recommended to exclude a cystic renal neoplasm. 4.  Mild thickening and enhancement of the gallbladder wall.  No evidence of gallstones.  This finding is nonspecific, but not favored to represent acute cholecystitis. 5.  Additional incidental findings, as above, similar to prior examinations.   Original Report Authenticated By: Florencia Reasons, M.D.     Assessment: s/p Procedure(s): THROMBECTOMY ARTERIOVENOUS GORE-TEX GRAFT REVISION OF ARTERIOVENOUS GORETEX GRAFT Patient Active Problem List    Diagnosis  . SBO (small bowel obstruction) s/p EL/LOA and SBR x 2  . CKD (chronic kidney disease) stage V requiring chronic dialysis  . Thrombocytopenia  . Gout  . HTN (hypertension)  . Chronic use of steroids  . Malnutrition, calorie  . Leg graft occlusion  . Acute respiratory failure with hypoxia  . Sinus tachycardia  . Abscess of abdominal cavity  . Wound dehiscence  . Bleeding  . Anemia    Surgically stable CT resolved abscess, ? Lesion left kidney > radiologist recommended sono ? Thrombus around femoral catheter  Plan: Continue current management. Will check renal ultrasound   LOS: 58 days     Currie Paris, MD, [facility], [place] [phone]   05/30/2012 12:18 PM

## 2012-05-30 NOTE — Progress Notes (Signed)
Patient ID: [name], male   DOB: 08/18/1961, 51 y.o.   MRN: [id number]  [facility] PROGRESS NOTE  [name] YQM:[id number] DOB: 1961/03/28 DOA: 04/02/2012 PCP: Trevor Iha, MD  Brief narrative:  51 year old male with past medical history of ESRD (MWF), s/p renal transplant about 17 years ago, prostatectomy in 2012, admitted 04/02/2012 with abdominal pain, nausea and vomiting. Work up done indicated SBO and pt has required Ex Lap for lysis of adhesions. He subsequently had an anastomotic leak with an abscess which has necessitated an omental patching of the anastomosis and percutaneous drain placement. The patient had an elevation of his white blood cell count and CT of the abdomen revealed an enlarging abscess an enteric fistula was obstructed drainage tube necessitated repositioning of the percutaneous drain a percutaneous drain. Presently the patient has an open abdominal wound which is connected with the abscess cavity and is being managed by general surgery and wound ostomy care.   Principal Problem:  *SBO (small bowel obstruction)  - patient is s/p exploratory laparotomy and lysis of adhesions and partial small bowel x 2 on 7/24.  - Patient has had subsequent anastomotic leak- omental patch of anastomosis on 8/2.  - On 8/8, CT abdomen and pelvis demonstrated several fluid collections consistent with abscess, largest measuring 16.5 x 13.7 x 6.4 cm.  - Patient is status post percutaneous drainage of largest fluid collection 04/30/2012. Repeat CT done for increasing WBC count on 8/13 revealed enlarging abscess and an enteric fistula with obstructed drainage tube. The drain was repositioned on 8/14. Since that time, he has had an open abdominal wound which connects to an abscess cavity, which has been slowly closing.  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  - Patient should not bend the right hip per interventional radiology.  Chronic nonocclusive left lower extremity DVT.  - resolved  - Diagnosed via Doppler 04/30/2012 and follow up on 8/21 showed no evidence of DVT, superficial thrombosis or baker's cyst.  CKD (chronic kidney disease) stage V requiring chronic dialysis  - Dialysis per nephrology schedule  HTN (hypertension),  - BP 143/75  - Continue to monitor vitals per floor protocol  Anemia due to chronic disease  - Stable  - Has received a total of 4 units of packed red blood cells this admission  - Continue aranesp and ferric gluconate  GOUT  - Patient had been on pred 5mg  daily. Weaned IV hydrocortisone. Currently at 25 mg daily.  - Wean to 20mg  daily 9/2 (equivalent to pred 5) and will continue until tolerating PO  Leg graft occlusion  - s/p thrombectomy 7/25  - unfortunately re clotted and undergone an additional thrombectomy procedure with revision of arterial and graft on 04/27/2012.  Severe protein calorie malnutrition  - Patient is cachectic  - Parenteral nutrition managed by surgery and pharmacy.   Code Status: Full  Family  Communication: Pt at bedside over 60 minutes and sister over the phone  Disposition Plan: Pending LTAC bed vs SNF vs home   Consultants:  Surgery  Nephrology IR Antibiotics:  Zosyn 05/20/2012 -->  HPI/Subjective: No events overnight.    Objective: Filed Vitals:   05/29/12 2034 05/29/12 2122 05/30/12 0530 05/30/12 0956  BP: 130/83 161/74 167/88 129/73  Pulse: 88 87 77 76  Temp: 97.3 F (36.3 C) 98.4 F (36.9 C) 98.7 F (37.1 C) 98.3 F (36.8 C)  TempSrc: Oral Oral Oral Oral  Resp: 20 18 18 18   Height:      Weight:  51.9 kg (114 lb 6.7 oz)    SpO2: 99% 97% 97% 98%    Intake/Output Summary (Last 24 hours) at 05/30/12 1545 Last data filed at 05/30/12 0900  Gross per 24 hour  Intake  16109 ml  Output   4003 ml  Net  14120 ml    Exam:   General:  Pt is alert, follows commands appropriately, not in acute distress  Cardiovascular: Regular rate and rhythm, S1/S2, no murmurs, no rubs, no gallops  Respiratory: Clear to auscultation bilaterally, no wheezing, no crackles, no rhonchi  Abdomen: Soft, non tender, non distended, bowel sounds present, no guarding  Extremities: No edema, pulses DP and PT palpable bilaterally  Neuro: Grossly nonfocal  Data Reviewed: Basic Metabolic Panel:  Lab 05/29/12 6045 05/28/12 0555 05/27/12 0649 05/25/12 0907 05/24/12 0757 05/24/12 0530  NA 127* 132* 128* 128* 127* --  K 3.3* 3.8 3.4* 3.5 3.5 --  CL 90* 93* 91* 90* 91* --  CO2 18* 26 19 24 19  --  GLUCOSE 86 104* 100* 97 122* --  BUN 72* 41* 61* 50* 77* --  CREATININE 5.76* 4.16* 5.35* 4.85* 6.41* --  CALCIUM 10.0 10.0 9.9 9.5 9.7 --  MG -- -- 1.5 -- -- 1.5  PHOS 3.1 -- 2.7 -- -- 2.5   Liver Function Tests:  Lab 05/29/12 0953 05/27/12 0649 05/25/12 0907 05/24/12 0530  AST -- 42* 36 28  ALT -- 37 30 30  ALKPHOS -- 170* 161* 159*  BILITOT -- 1.6* 1.6* 1.9*  PROT -- 5.6* 5.2* 5.3*  ALBUMIN 1.6* 1.6*1.7* 1.5* 1.6*   No results found for this basename: LIPASE:5,AMYLASE:5 in the last 168 hours No results found for this basename: AMMONIA:5 in the last 168 hours CBC:  Lab 05/29/12 0953 05/28/12 0555 05/27/12 0648 05/25/12 0907 05/24/12 0530  WBC 7.0 6.3 7.2 6.2 7.4  NEUTROABS -- -- -- -- 4.9  HGB 12.5* 12.8*  12.3* 11.9* 11.9*  HCT 36.5* 38.8* 36.5* 34.9* 35.3*  MCV 80.9 82.6 81.7 81.5 82.7  PLT 134* 120* 149* 110* 117*   Cardiac Enzymes: No results found for this basename: CKTOTAL:5,CKMB:5,CKMBINDEX:5,TROPONINI:5 in the last 168 hours BNP: No components found with this basename: POCBNP:5 CBG:  Lab 05/30/12 0755 05/29/12 2355 05/29/12 1552 05/29/12 0800 05/29/12 0010  GLUCAP 91 127* 109* 95 115*    No results found for this or any previous visit (from the past 240 hour(s)).   Scheduled Meds:   . antiseptic oral rinse  15 mL Mouth Rinse q12n4p  . feeding supplement (NEPRO CARB STEADY)  237 mL Oral TID BM  . feeding supplement  1 Container Oral Q24H  . fentaNYL  37.5 mcg Transdermal Q72H  . fluconazole (DIFLUCAN) IV  400 mg Intravenous Q T,Th,Sa-HD  . heparin  40 Units/kg Dialysis Once in dialysis  . hydrocortisone sod succinate (SOLU-CORTEF) injection  20 mg Intravenous  Daily  . insulin aspart  0-15 Units Subcutaneous Q8H  . labetalol  10 mg Intravenous Q6H  . lip balm   Topical BID  . pantoprazole (PROTONIX) IV  40 mg Intravenous Daily  . piperacillin-tazobactam (ZOSYN)  IV  2.25 g Intravenous Q8H  . simethicone  80 mg Oral QID  . sodium chloride  10-40 mL Intracatheter Q12H   Continuous Infusions:   . fat emulsion 240 mL (05/28/12 1705)  . TPN (CLINIMIX) +/- additives 45 mL/hr at 05/28/12 1705  . TPN (CLINIMIX) +/- additives 45 mL/hr at 05/29/12 1701  . TPN [facility]) +/- additives       Debbora Presto, MD  [facility] Pager [phone]  If 7PM-7AM, please contact night-coverage [email or web address] Password [email or web address] 05/30/2012, 3:45 PM   LOS: 58 days

## 2012-05-31 ENCOUNTER — Inpatient Hospital Stay (HOSPITAL_COMMUNITY): Payer: Medicare Other

## 2012-05-31 LAB — COMPREHENSIVE METABOLIC PANEL
ALT: 34 U/L (ref 0–53)
CO2: 22 mEq/L (ref 19–32)
Calcium: 10.2 mg/dL (ref 8.4–10.5)
GFR calc Af Amer: 13 mL/min — ABNORMAL LOW (ref >90)
GFR calc non Af Amer: 11 mL/min — ABNORMAL LOW (ref >90)
Glucose, Bld: 88 mg/dL (ref 70–99)
Sodium: 133 mEq/L — ABNORMAL LOW (ref 135–145)

## 2012-05-31 LAB — GLUCOSE, CAPILLARY: Glucose-Capillary: 113 mg/dL — ABNORMAL HIGH (ref 70–99)

## 2012-05-31 LAB — PREALBUMIN: Prealbumin: 24.7 mg/dL (ref 17.0–34.0)

## 2012-05-31 LAB — TRIGLYCERIDES: Triglycerides: 125 mg/dL (ref <150)

## 2012-05-31 LAB — CBC WITH DIFFERENTIAL/PLATELET
Basophils Absolute: 0 10*3/uL (ref 0.0–0.1)
Eosinophils Absolute: 0.1 10*3/uL (ref 0.0–0.7)
Hemoglobin: 12.7 g/dL — ABNORMAL LOW (ref 13.0–17.0)
Lymphocytes Relative: 25 % (ref 12–46)
MCHC: 33.2 g/dL (ref 30.0–36.0)
Neutrophils Relative %: 57 % (ref 43–77)
Platelets: 129 10*3/uL — ABNORMAL LOW (ref 150–400)
RDW: 18.4 % — ABNORMAL HIGH (ref 11.5–15.5)

## 2012-05-31 LAB — CHOLESTEROL, TOTAL: Cholesterol: 173 mg/dL (ref 0–200)

## 2012-05-31 LAB — MRSA PCR SCREENING: MRSA by PCR: NEGATIVE

## 2012-05-31 MED ORDER — IOHEXOL 300 MG/ML  SOLN
50.0000 mL | Freq: Once | INTRAMUSCULAR | Status: AC | PRN
  Administered 2012-05-31: 15 mL via INTRAVENOUS

## 2012-05-31 MED ORDER — FAT EMULSION 20 % IV EMUL
240.0000 mL | INTRAVENOUS | Status: AC
  Administered 2012-05-31: 240 mL via INTRAVENOUS
  Filled 2012-05-31 (×2): qty 250

## 2012-05-31 MED ORDER — ZINC TRACE METAL 1 MG/ML IV SOLN
INTRAVENOUS | Status: AC
  Administered 2012-05-31: 18:00:00 via INTRAVENOUS
  Filled 2012-05-31: qty 2000

## 2012-05-31 NOTE — Progress Notes (Signed)
Agree with above.  Urie Loughner O. Meika Earll, III, MD, FACS [phone]--pager [phone]--office [facility]  

## 2012-05-31 NOTE — Progress Notes (Signed)
34 Days Post-Op  Subjective: BAck from Ultrasound, no distress or complaints.  Objective: Vital signs in last 24 hours: Temp:  [97.8 F (36.6 C)-98.5 F (36.9 C)] 97.8 F (36.6 C) (09/09 0409) Pulse Rate:  [76-87] 76  (09/09 0409) Resp:  [16-18] 16  (09/09 0409) BP: (127-191)/(73-98) 190/84 mmHg (09/09 0409) SpO2:  [98 %-100 %] 100 % (09/09 0409) Weight:  [51.7 kg (113 lb 15.7 oz)] 51.7 kg (113 lb 15.7 oz) (09/08 2058) Last BM Date: 05/30/12  Afebrile, VSS, BP with poor control, K+ 3.1, 5 cc thru the drain. Full liquid diet, TNA, Drain RLQ with feculent colored material much more than 5CC in bag this AM CT 05/28/12: RLQ abscess is resolved, 3.2 x 3 cm left renal lesion .Ultra sound just completed Intake/Output from previous day: 09/08 0701 - 09/09 0700 In: 2200.6 [P.O.:720; IV Piggyback:150; TPN:1330.6] Out: 6 [Drains:5; Stool:1] Intake/Output this shift:    General appearance: alert, cooperative and no distress GI: soft, dressing over wound looks fine.  He has yellow-brown feculent material from the drain.  Lab Results:   Basename 05/31/12 0650 05/29/12 0953  WBC 6.3 7.0  HGB 12.7* 12.5*  HCT 38.3* 36.5*  PLT PENDING 134*    BMET  Basename 05/29/12 0953  NA 127*  K 3.3*  CL 90*  CO2 18*  GLUCOSE 86  BUN 72*  CREATININE 5.76*  CALCIUM 10.0   PT/INR No results found for this basename: LABPROT:2,INR:2 in the last 72 hours   Lab 05/29/12 0953 05/27/12 0649 05/25/12 0907  AST -- 42* 36  ALT -- 37 30  ALKPHOS -- 170* 161*  BILITOT -- 1.6* 1.6*  PROT -- 5.6* 5.2*  ALBUMIN 1.6* 1.6*1.7* 1.5*     Lipase  No results found for this basename: lipase     Studies/Results: Ct Abdomen Pelvis W Contrast  05/29/2012  *RADIOLOGY REPORT*  Clinical Data: Evaluate abscess.  History of renal failure.  CT ABDOMEN AND PELVIS WITH CONTRAST  Technique:  Multidetector CT imaging of the abdomen and pelvis was performed following the standard protocol during bolus  administration of intravenous contrast.  Contrast: OMNIPAQUE IOHEXOL 300 MG/ML  SOLN  Comparison: CT of the abdomen and pelvis 05/04/2012.  Findings:  Lung Bases: Small bilateral pleural effusions with dependent atelectasis in the lower lobes of the lungs bilaterally (left greater than right).  Atherosclerotic calcifications in the distal right coronary artery.  Abdomen/Pelvis:  Gallbladder is largely decompressed, but has a thickened enhancing wall.  This is nonspecific.  The appearance of the liver, pancreas, spleen and right adrenal gland is unremarkable.  There is a 1.8 x 1.1 cm nodule in the left adrenal gland which is unchanged.  Kidneys are atrophic bilaterally and there are numerous low attenuation lesions, most of which are too small to definitively characterize.  In addition, there is a 3.0 x 3.2 cm intermediate attenuation (28 HU) lesion in the interpolar region of the left kidney which is similar to recent prior examinations, but it is new compared to more remote prior examinations (example 11/01/2004).  Compared to the prior examinations of previously noted right lower quadrant and its abscess appears to have nearly completely resolved.  There is a large drainage catheter which extends throughout the previously noted abscess cavity, and little to no residual fluid remains around the catheter at this time.  There is a small amount of gas associated with the tip of the catheter, presumably iatrogenic.  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  No significant volume of ascites.  No pneumoperitoneum.  No pathologic distension of bowel.  There is a right femoral approach dialysis catheter in place extending to the proximal right common iliac vein.  A small filling defect associated with the tip of the catheter could suggest some catheter associated thrombus (image 53 of series 2) extensive atherosclerosis of the  abdominal and pelvic vasculature. Surgical clips in the low pelvis posterior to the urinary bladder.  Urinary bladder is nearly completely decompressed.  Musculoskeletal: Healing midline abdominal wound.  There are no aggressive appearing lytic or blastic lesions noted in the visualized portions of the skeleton.  IMPRESSION: 1.  The patient's right lower quadrant abscess is now essentially completely resolved.  A drainage catheter remains in place within the abscess cavity which is completely decompressed. 2.  There is there is a small filling defect associated with tip of the right femoral central venous catheter, which could represent catheter associated thrombus. 3.  A 3.2 x 3.0 cm intermediate attenuation left renal lesion may simply represent a proteinaceous cyst.  However, this appears to have slightly increased progressively over recent prior examinations, and is clearly new compared to more remote prior CT studies from 2006.  Further evaluation with renal ultrasound is recommended to exclude a cystic renal neoplasm. 4.  Mild thickening and enhancement of the gallbladder wall.  No evidence of gallstones.  This finding is nonspecific, but not favored to represent acute cholecystitis. 5.  Additional incidental findings, as above, similar to prior examinations.   Original Report Authenticated By: Florencia Reasons, M.D.     Medications:    . antiseptic oral rinse  15 mL Mouth Rinse q12n4p  . feeding supplement (NEPRO CARB STEADY)  237 mL Oral TID BM  . feeding supplement  1 Container Oral Q24H  . fentaNYL  37.5 mcg Transdermal Q72H  . fluconazole (DIFLUCAN) IV  400 mg Intravenous Q T,Th,Sa-HD  . heparin  40 Units/kg Dialysis Once in dialysis  . hydrocortisone sod succinate (SOLU-CORTEF) injection  20 mg Intravenous Daily  . insulin aspart  0-15 Units Subcutaneous Q8H  . labetalol  10 mg Intravenous Q6H  . lip balm   Topical BID  . pantoprazole (PROTONIX) IV  40 mg Intravenous Daily  .  piperacillin-tazobactam (ZOSYN)  IV  2.25 g Intravenous Q8H  . simethicone  80 mg Oral QID  . 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 Exploratory laparotomy.  2. Lysis of adhesions.  3. Small bowel resection with side-to-side stapled anastomosis.  4. Omental patch of small bowel anastomosis.   ESRD on HD ? Left renal lesion. THROMBECTOMY ARTERIOVENOUS GORE-TEX GRAFT (Left) - Thrombectomy of left thigh arteriovenous gortex graft  REVISION OF ARTERIOVENOUS GORETEX GRAFT  . SBO (small bowel obstruction) s/p EL/LOA and SBR x 2  . CKD (chronic kidney disease) stage V requiring chronic dialysis  . Thrombocytopenia  . Gout  . HTN (hypertension)  . Chronic use of steroids  . Malnutrition, calorie  . Leg graft occlusion  . Acute respiratory failure with hypoxia  . Sinus tachycardia  . Abscess of abdominal cavity  . Wound dehiscence  . Bleeding  . Anemia    Plan:  Ongoing TNA, Percutaneous drain RLQ abscess improved but still draining.  Stable on full liquids.  LOS: 59 days    Emmakate Hypes 05/31/2012

## 2012-05-31 NOTE — Progress Notes (Signed)
[facility] Progress Note  Subjective:  Having lots of gas.  Trying to sip nepro - drinking it fast causes diarrhea. Last HD Saturday.  Objective Filed Vitals:   05/30/12 2058 05/31/12 0310 05/31/12 0409 05/31/12 0903  BP: 169/83 191/88 190/84 160/77  Pulse: 87  76 74  Temp: 98 F (36.7 C)  97.8 F (36.6 C) 97.6 F (36.4 C)  TempSrc: Oral  Oral Oral  Resp: 16  16 16   Height:      Weight: 51.7 kg (113 lb 15.7 oz)     SpO2: 100%  100% 95%   Physical Exam General: Eating greits. Heart: RRR Lungs: no wheezes or rales Abdomen: soft Extremities: right LQ triple lumen infusing TNA and right LQ drain with dark liquid Dialysis Access: left thigh AVGG patent  Assessment/Plan: 1. SBO - s/p surg/dehisc/abscess with controlled EC fistula and pelvic drain, CCS managing on PO liquids and IV Zosyn & Diflucan, still requiring TNA; taking some liquids 2. ESRD - HD on TTS @ [facility], no Hep HD per surg request; using 4 K bath; no K restriction in diet at present 3. Anemia - Hgb 12.7, holding aranesp / no iron. 4. 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 HTN/volume - BP most recently 167/88, IV labetalol 10 mg q 6 hours; volume stable now with hd 3x per wk.  ? Change to po meds 6. Malnutrition - on Full liquids and TNA, Alb 1.7, but prealbumin 24.7 9/02).  Dr. Arlean Hopping is concerned about limited mobility due to femoral catheter, and also about the suggestion or small area of possible clot on the tip of the catheter. I agree with him and  think we should take out the triple lumen large-bore dialysis catheter in the R groin, give the vein a 1 or 2 day holiday, then have IR reevaluate the upper extremities for a central or PICC line; then if that is not available, place a standard central line back into the R femoral vein, something that would not prohibit him from getting  out of bed and walking. I discusses with the pt and said we would likely make a decision today. Discussed with Dr. Lowella Dandy of Int Radiology- he will bring Mr Hersh down today or tomorrow and evaluate the situation.  He said he think that the R IJ should be open.  If nothing can be found in the neck , they will replace the HD cath with a smaller bore central line in the R groin. 7. Left renal mass - follow conservatively for now with serial CTs 8. Gout - stable, on IV Solu-Cortef   Sheffield Slider, PA-C [facility] Beeper [phone]  05/31/2012,9:51 AM  LOS: 59 days    Additional Objective Labs: Basic Metabolic Panel:  Lab 05/31/12 4540 05/29/12 0953 05/28/12 0555 05/27/12 0649  NA 133* 127* 132* --  K 3.1* 3.3* 3.8 --  CL 93* 90* 93* --  CO2 22 18* 26 --  GLUCOSE 88 86 104* --  BUN 56* 72* 41* --  CREATININE 5.43* 5.76* 4.16* --  CALCIUM 10.2 10.0 10.0 --  ALB -- -- -- --  PHOS 2.9 3.1 -- 2.7   Liver Function Tests:  Lab 05/31/12 0650 05/29/12 0953 05/27/12 0649 05/25/12 0907  AST 37 -- 42* 36  ALT 34 -- 37 30  ALKPHOS  173* -- 170* 161*  BILITOT 1.5* -- 1.6* 1.6*  PROT 5.4* -- 5.6* 5.2*  ALBUMIN 1.7* 1.6* 1.6*1.7* --   CBC:  Lab 05/31/12 0650 05/29/12 0953 05/28/12 0555 05/27/12 0648 05/25/12 0907  WBC 6.3 7.0 6.3 -- --  NEUTROABS 3.5 -- -- -- --  HGB 12.7* 12.5* 12.8* -- --  HCT 38.3* 36.5* 38.8* -- --  MCV 83.4 80.9 82.6 81.7 81.5  PLT 129* 134* 120* -- --    Lab 05/31/12 0757 05/31/12 0021 05/30/12 1623 05/30/12 0755 05/29/12 2355  GLUCAP 100* 103* 150* 91 127*   US Renal  05/31/2012  *RADIOLOGY REPORT*  Clinical Data: Evaluate abnormality of left kidney.  RENAL/URINARY TRACT ULTRASOUND COMPLETE  Comparison:  CT of 05/29/2012 and 04/03/2012  Findings:  Right Kidney:   6.3 cm.  Moderate cortical thinning and increased echogenicity.  Small simple appearing lesions within are likely cysts.  Left Kidney:  7.2 cm.  Moderate renal cortical thinning and  increased echogenicity.  Corresponding to the CT abnormality, in the interpolar left kidney, is a 2.6 x 2.6 x 2.8 cm lesion.  This is centrally anechoic with increased through transmission.  This has a heterogeneously thickened wall with a suggestion of vascularity on image 29 superiorly.  Bladder:  Within normal limits.  A left-sided pleural effusion is incidentally noted.  Left iliac fossa renal transplant demonstrates increased echogenicity with an interpolar simple appearing cyst.  There is cortical thinning of the renal transplant as well.  IMPRESSION:  1.  Indeterminate left renal mass.  This could represent a cyst complicated by prior hemorrhage/infection, or a cystic neoplasm. Potential clinical strategies would include surveillance with  CT, further characterization with MRI, or surgical resection. 2.  Findings of medical renal disease. 3.  Left-sided pleural effusion. 4.  Left iliac fossa renal transplant with transplant cortical thinning and increased echogenicity, suggesting transplant failure.   Original Report Authenticated By: Consuello Bossier, M.D.    Medications:    . TPN (CLINIMIX) +/- additives 45 mL/hr at 05/29/12 1701  . TPN (CLINIMIX) +/- additives 45 mL/hr at 05/30/12 1741      . antiseptic oral rinse  15 mL Mouth Rinse q12n4p  . feeding supplement (NEPRO CARB STEADY)  237 mL Oral TID BM  . feeding supplement  1 Container Oral Q24H  . fentaNYL  37.5 mcg Transdermal Q72H  . fluconazole (DIFLUCAN) IV  400 mg Intravenous Q T,Th,Sa-HD  . heparin  40 Units/kg Dialysis Once in dialysis  . hydrocortisone sod succinate (SOLU-CORTEF) injection  20 mg Intravenous Daily  . insulin aspart  0-15 Units Subcutaneous Q8H  . labetalol  10 mg Intravenous Q6H  . lip balm   Topical BID  . pantoprazole (PROTONIX) IV  40 mg Intravenous Daily  . piperacillin-tazobactam (ZOSYN)  IV  2.25 g Intravenous Q8H  . simethicone  80 mg Oral QID  . sodium chloride  10-40 mL Intracatheter Q12H

## 2012-05-31 NOTE — Progress Notes (Signed)
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 Allopurinol     REACTION: decreased platelets  . Aspirin     REACTION: unspecified    Patient Measurements: Height: 5\' 11"  (180.3 cm) Weight: 113 lb 15.7 oz (51.7 kg) IBW/kg (Calculated) : 75.3  Adjusted Body Weight:  Usual Weight:   Vital Signs: Temp: 97.6 F (36.4 C) (09/09 0903) Temp src: Oral (09/09 0903) BP: 160/77 mmHg (09/09 0903) Pulse Rate: 74  (09/09 0903) Intake/Output from previous day: 09/08 0701 - 09/09 0700 In: 2200.6 [P.O.:720; IV Piggyback:150; TPN:1330.6] Out: 6 [Drains:5; Stool:1] Intake/Output from this shift: Total I/O In: 5 [Other:5] Out: 5 [Drains:5]  Labs:  [facility] 05/31/12 0650 05/29/12 0953  WBC 6.3 7.0  HGB 12.7* 12.5*  HCT 38.3* 36.5*  PLT 129* 134*  APTT -- --  INR -- --     Basename 05/31/12 0650 05/29/12 0953  NA 133* 127*  K 3.1* 3.3*  CL 93* 90*  CO2 22 18*  GLUCOSE 88 86  BUN 56* 72*  CREATININE 5.43* 5.76*  LABCREA -- --  CREAT24HRUR -- --  CALCIUM 10.2 10.0  MG 1.6 --  PHOS 2.9 3.1  PROT 5.4* --  ALBUMIN 1.7* 1.6*  AST 37 --  ALT 34 --  ALKPHOS 173* --  BILITOT 1.5* --  BILIDIR -- --  IBILI -- --  PREALBUMIN -- --  TRIG 125 --  CHOLHDL -- --  CHOL 173 --   Estimated Creatinine Clearance: 11.6 ml/min (by C-G formula based on Cr of 5.43).    Basename 05/31/12 0757 05/31/12 0021 05/30/12 1623  GLUCAP 100* 103* 150*   Insulin Requirements in the past 24 hours:  Minimal SSI use, 15 units insulin in TPN  Current Nutrition:  Full liquids and TPN at reduced rate to encourage PO intake  Nutritional Goals:  1700-1950 kCal, 80-100 grams of protein per day   Assessment: Admitted 7/13 with abd pain, weight loss, SBO. S/p LOA, SBR 7/24 and repeat LOA, SBR with anasomotic leak repair 8/1, thrombectomy with AVG revision 7/25 and repeat thrombectomy 8/6. CT on 8/13 reveals  enlarging abscess and EC fistula.   GI: Tolerating TPN. Pt's wt appears to be relatively stable. Gravity drain bag due to controlled SB. Now on Fulls- tolerating PO but feels full quickly.  (+)BMs, (+)gas. Simethicone. 9/8: Plan wean TPN as calorie intake allows- may be several weeks.  Endo: CBGs < 150 since rate decrease & minimal SSI. On solu-cortef 20 daily (equiv pred 5 daily PTA)  Lytes: No labs today. Na low at 127 d/t overload (-3.7L after HD 9/5), K stable at 3.1 (4K bath). Hypercalcemia continues (corrected Ca 12, Ca x PO4 remains < 55, Ca bath). Mag 1.6, Phos 2.9- improved. Renal MD replacing lytes, on no binders  Renal: ESRD with HD typically TTS. HD is currently 4x/week (MTTS) while pt requiring TPN adding to volume issues - plan to try and cut back to T/T/S. Current placement HD access limiting mobility, may change with 1-2 day HD holiday in order to reposition.  CSW trying to find LTAC HD bed that can accommodate HD 4x/wk if TPN needs to continue. On Aranesp qSat- now on hold with Hb 12.8.  Cards: BP variable. (HTN d/t volume, BP nml during HD). On scheduled labetolol, prn hydralazine  Hepatobil: LFTs WNL, except alk phos/Tbili elevated. Prealbumin remains at goal. Trig WNL 128  Neuro: GCS 20, no c/o  pain - on fentanyl patch, prn pain meds.  ID: Fluconazole + Zosyn for peritonitis/abd abscess -- Noted primary team plans to cont abx thru 9/16, then re-CT abd and consult with ID for further recs on abx duration. Afeb, WBC WNL. Abd abscess culture: candida tropicalis.  RLQ drain to abscess- improved, but continues to drain- output feculent in nature. Zosyn 8.1>>  Diflucan 8.16>> (s/p 14 d mycamine)  Heme/Onc: Hgb up to 12.5 on Aranesp q Saturday (on hold).  Best Practices: Mouthcare, PPI po   Plan:  1. Cont Clinimix 5/15 (no lytes) 45 ml/hr to try to increase PO drive per MD, this will provide 54 g protein/d (68% of goal) and 874 avg kcal/d (50% of goal). MD/RD aware TPN not meeting kcal  goals with Clinimix alone. Pt appears to be maintaining weight and prealbumin on previous regimen, now w slowly increasing PO intake.  2. Electrolyte replacement per renal MD  3. Provide available trace elements, MVI and lipids @5cc /hr on MWF only d/t national backorder  4. F/up PO intake to assess future TPN needs   Marisue Humble, PharmD Clinical Pharmacist [facility]

## 2012-05-31 NOTE — Progress Notes (Signed)
Nutrition Follow-up/Consult  Intervention:   1. Continue TPN per pharmacy 2. Continue Breeze and Nepro as able. Provided emotional support and encouraged pt to consume liquids as able throughout the day. 3. Pt asking about diet advancement - would like to try eggs - advance diet soon? 4. Recommend trophic enteral feedings once able - will defer to surgery 5. RD to continue to follow nutrition care plan   Assessment:   TPN decreased on 9/6 to help increase PO intake per MD. Pt able to tolerate small amounts of Nepro Shakes and cream soups.  Patient continues to TPN with Clinimix 5/15 @ 45 ml/hr. Lipids (20% IVFE @ 5 ml/hr), multivitamins, and trace elements are provided 3 times weekly (MWF) due to national backorder. Provides 874 kcal and 54 grams protein daily (based on weekly average). Meets 51% minimum estimated kcal and 68% estimated protein needs. Prealbumin remains WNL.   Diet Order:  Full Liquids Supplement: Resource Breeze daily  Meds: Scheduled Meds:    . antiseptic oral rinse  15 mL Mouth Rinse q12n4p  . feeding supplement (NEPRO CARB STEADY)  237 mL Oral TID BM  . feeding supplement  1 Container Oral Q24H  . fentaNYL  37.5 mcg Transdermal Q72H  . fluconazole (DIFLUCAN) IV  400 mg Intravenous Q T,Th,Sa-HD  . heparin  40 Units/kg Dialysis Once in dialysis  . hydrocortisone sod succinate (SOLU-CORTEF) injection  20 mg Intravenous Daily  . insulin aspart  0-15 Units Subcutaneous Q8H  . labetalol  10 mg Intravenous Q6H  . lip balm   Topical BID  . pantoprazole (PROTONIX) IV  40 mg Intravenous Daily  . piperacillin-tazobactam (ZOSYN)  IV  2.25 g Intravenous Q8H  . simethicone  80 mg Oral QID  . sodium chloride  10-40 mL Intracatheter Q12H   Continuous Infusions:    . TPN (CLINIMIX) +/- additives     And  . fat emulsion    . TPN (CLINIMIX) +/- additives 45 mL/hr at 05/29/12 1701  . TPN (CLINIMIX) +/- additives 45 mL/hr at 05/30/12 1741   PRN Meds:.sodium chloride,  acetaminophen, acetaminophen, albuterol, heparin, heparin, lidocaine, lidocaine-prilocaine, morphine injection, nitroGLYCERIN, ondansetron (ZOFRAN) IV, ondansetron, pentafluoroprop-tetrafluoroeth, promethazine, sodium chloride  Labs:  CMP     Component Value Date/Time   NA 133* 05/31/2012 0650   K 3.1* 05/31/2012 0650   CL 93* 05/31/2012 0650   CO2 22 05/31/2012 0650   GLUCOSE 88 05/31/2012 0650   BUN 56* 05/31/2012 0650   CREATININE 5.43* 05/31/2012 0650   CALCIUM 10.2 05/31/2012 0650   PROT 5.4* 05/31/2012 0650   ALBUMIN 1.7* 05/31/2012 0650   AST 37 05/31/2012 0650   ALT 34 05/31/2012 0650   ALKPHOS 173* 05/31/2012 0650   BILITOT 1.5* 05/31/2012 0650   GFRNONAA 11* 05/31/2012 0650   GFRAA 13* 05/31/2012 0650   Phosphorus  Date/Time Value Range Status  05/31/2012  6:50 AM 2.9  2.3 - 4.6 mg/dL Final  09/30/1476  2:95 AM 3.1  2.3 - 4.6 mg/dL Final  02/22/1307  6:57 AM 2.7  2.3 - 4.6 mg/dL Final   Potassium  Date/Time Value Range Status  05/31/2012  6:50 AM 3.1* 3.5 - 5.1 mEq/L Final  05/29/2012  9:53 AM 3.3* 3.5 - 5.1 mEq/L Final  05/28/2012  5:55 AM 3.8  3.5 - 5.1 mEq/L Final   Magnesium  Date/Time Value Range Status  05/31/2012  6:50 AM 1.6  1.5 - 2.5 mg/dL Final  04/25/6961  9:52 AM 1.5  1.5 - 2.5 mg/dL  Final  05/24/2012  5:30 AM 1.5  1.5 - 2.5 mg/dL Final   Prealbumin  Date/Time Value Range Status  05/24/2012  5:30 AM 24.7  17.0 - 34.0 mg/dL Final  0/45/4098  1:19 AM 23.4  17.0 - 34.0 mg/dL Final  1/47/8295  6:21 AM 26.6  17.0 - 34.0 mg/dL Final   Lipid Panel     Component Value Date/Time   CHOL 173 05/31/2012 0650   TRIG 125 05/31/2012 0650    Intake/Output Summary (Last 24 hours) at 05/31/12 1129 Last data filed at 05/31/12 1015  Gross per 24 hour  Intake 1915.58 ml  Output     11 ml  Net 1904.58 ml  BM 9/8  Weight Status: 46.9 kg s/p HD 9/5 - trending down 49.6 kg s/p HD on 9/3  49.8 kg s/p HD on 9/3  53.1 kg s/p HD on 8/27  51.5 kg s/p HD on 8/22 52.1 kg s/p HD on 8/20  54 kg s/p HD on 8/17 63.8  kg s/p HD on 8/12 65.7 kg s/p HD on 8/8  50.5 kg s/p HD on 8/6 50 kg s/p HD on 8/5 47.8 kg s/p HD on 7/30  48.3 kg s/p HD on 7/27  Body mass index is 15.90 kg/(m^2). Underweight.  Estimated needs:  [calculated using the Kidney Disease Outcomes Quality Initiative (KDOQI) Adjustment Equation for the Underweight Patient]: 1700 - 1950 kcal, 80 - 100 grams protein daily  Nutrition Dx:  Inadequate oral intake now R/T GI distress AEB pt report. Ongoing.  Goal:  Intake to meet at least 90% of estimated needs. Met with TPN + oral intake.  Monitor:  Diet advancement, TPN adequacy, weights, labs, I/O's, enteral nutrition initiation  Jarold Motto MS, RD, LDN Pager: [phone] After-hours pager: [phone]

## 2012-05-31 NOTE — Consult Note (Signed)
ANTIBIOTIC CONSULT NOTE-PROGRESS NOTE  Pharmacy Consult for Fluconazole and Zosyn Indication: abdominal abscess with fistula   Hospital Problems Principal Problem:  *SBO (small bowel obstruction) s/p EL/LOA and SBR x 2 Active Problems:  CKD (chronic kidney disease) stage V requiring chronic dialysis  Thrombocytopenia  Gout  HTN (hypertension)  Chronic use of steroids  Malnutrition, calorie  Leg graft occlusion  Acute respiratory failure with hypoxia  Sinus tachycardia  Abscess of abdominal cavity  Wound dehiscence  Bleeding  Anemia   Allergies Allergies  Allergen Reactions  . Allopurinol     REACTION: decreased platelets  . Aspirin     REACTION: unspecified    Patient Measurements: Height: 5\' 11"  (180.3 cm) Weight: 113 lb 15.7 oz (51.7 kg) IBW/kg (Calculated) : 75.3   Vital Signs: BP 190/84  Pulse 76  Temp 97.8 F (36.6 C) (Oral)  Resp 16  Ht 5\' 11"  (1.803 m)  Wt 113 lb 15.7 oz (51.7 kg)  BMI 15.90 kg/m2  SpO2 100%  Intake/Output from previous day:  09/08 0701 - 09/09 0700 In: 2200.6 [P.O.:720; IV Piggyback:150; TPN:1330.6] Out: 6 [Drains:5; Stool:1]  Labs:  [facility] 05/31/12 0650 05/29/12 0953  WBC 6.3 7.0  HGB 12.7* 12.5*  PLT PENDING 134*  LABCREA -- --  CREATININE 5.43* 5.76*   Estimated Creatinine Clearance: 11.6 ml/min (by C-G formula based on Cr of 5.43). BUN/Cr/glu/ALT/AST/amyl/lip:  56/5.43/--/34/37/--/-- (09/09 1610)    Microbiology: No results found for this or any previous visit (from the past 720 hour(s)).  Anti-infectives Anti-infectives     Start     Dose/Rate Route Frequency Ordered Stop   05/22/12 1200   fluconazole (DIFLUCAN) IVPB 400 mg        400 mg 200 mL/hr over 60 Minutes Intravenous Every T-Th-Sa (Hemodialysis) 05/21/12 1108     05/20/12 2000   fluconazole (DIFLUCAN) IVPB 400 mg  Status:  Discontinued        400 mg 200 mL/hr over 60 Minutes Intravenous Once per day on Mon Tue Thu Sat 05/19/12 1417 05/21/12 1108     05/20/12 1700  piperacillin-tazobactam (ZOSYN) IVPB 2.25 g       2.25 g 100 mL/hr over 30 Minutes Intravenous Every 8 hours 05/20/12 1634     05/17/12 1600   fluconazole (DIFLUCAN) IVPB 200 mg        200 mg 100 mL/hr over 60 Minutes Intravenous  Once 05/17/12 1129 05/17/12 1940   05/08/12 1200   fluconazole (DIFLUCAN) IVPB 400 mg  Status:  Discontinued        400 mg 200 mL/hr over 60 Minutes Intravenous Every T-Th-Sa (Hemodialysis) 05/07/12 1752 05/19/12 1417   04/30/12 1400   vancomycin (VANCOCIN) 750 mg in sodium chloride 0.9 % 150 mL IVPB  Status:  Discontinued        750 mg 150 mL/hr over 60 Minutes Intravenous  Once 04/30/12 1212 04/30/12 1507   04/29/12 2330   vancomycin (VANCOCIN) 1,500 mg in sodium chloride 0.9 % 500 mL IVPB        1,500 mg 250 mL/hr over 120 Minutes Intravenous  Once 04/29/12 2259 04/30/12 0246   04/27/12 0000   ceFAZolin (ANCEF) IVPB 1 g/50 mL premix  Status:  Discontinued     Comments: Send with pt to OR      1 g 100 mL/hr over 30 Minutes Intravenous On call 04/26/12 0927 04/26/12 0928   04/24/12 1000   micafungin (MYCAMINE) 100 mg in sodium chloride 0.9 % 100 mL IVPB  Status:  Discontinued        100 mg 100 mL/hr over 1 Hours Intravenous Daily 04/24/12 0750 05/07/12 1713   04/23/12 1600   fluconazole (DIFLUCAN) IVPB 400 mg        400 mg 200 mL/hr over 60 Minutes Intravenous  Once 04/23/12 1428 04/23/12 1800   04/22/12 2200   piperacillin-tazobactam (ZOSYN) IVPB 3.375 g  Status:  Discontinued        3.375 g 100 mL/hr over 30 Minutes Intravenous 3 times per day 04/22/12 2120 04/22/12 2137   04/22/12 2200   piperacillin-tazobactam (ZOSYN) IVPB 2.25 g  Status:  Discontinued        2.25 g 100 mL/hr over 30 Minutes Intravenous 3 times per day 04/22/12 2140 05/20/12 0748   04/22/12 2157   gentamicin (GARAMYCIN) injection  Status:  Discontinued          As needed 04/22/12 2158 04/22/12 2335   04/22/12 2156   clindamycin (CLEOCIN) injection  Status:   Discontinued          As needed 04/22/12 2157 04/22/12 2335   04/22/12 2145  piperacillin-tazobactam (ZOSYN) IVPB 3.375 g       3.375 g 12.5 mL/hr over 240 Minutes Intravenous  Once 04/22/12 2141 04/23/12 0145   04/22/12 2130   gentamicin (GARAMYCIN) 240 mg, clindamycin (CLEOCIN) 900 mg in sodium chloride irrigation 0.9 % 1,000 mL irrigation  Status:  Discontinued         Irrigation  Once 04/22/12 2119 04/23/12 0020   04/14/12 0830   ertapenem (INVANZ) 1 g in sodium chloride 0.9 % 50 mL IVPB        1 g 100 mL/hr over 30 Minutes Intravenous To Surgery 04/14/12 0816 04/14/12 0834   04/13/12 1359   ertapenem (INVANZ) 1 g in sodium chloride 0.9 % 50 mL IVPB  Status:  Discontinued        1 g 100 mL/hr over 30 Minutes Intravenous 60 min pre-op 04/13/12 1359 04/14/12 0816          Assessment:  51 YO ESRD patient who continues on an extended course of fluconazole and Zosyn for intra-abdominal abscess and peritonitis.  Afebrile.   WBC 6.3.  On HD 3 x weekly.  Current maintenance antibiotic doses appropriate for HD.  No dose changes necessary at this time.  Plan:   Continue Zosyn and Diflucan as ordered.  Pharmacy will sign off.  If further assistance is required, please re-consult as required.  Thank you for allowing Pharmacy to participate in this patient's care.  Laurena Bering, Pharm.D.  05/31/2012 8:51 AM

## 2012-05-31 NOTE — Progress Notes (Signed)
Patient ID: [name], male   DOB: 04-Apr-1961, 51 y.o.   MRN: [id number]  [facility] PROGRESS NOTE  [name] HYQ:[id number] DOB: 12-22-1960 DOA: 04/02/2012 PCP: Trevor Iha, MD   Brief narrative:  51 year old male with past medical history of ESRD (MWF), s/p renal transplant about 17 years ago, prostatectomy in 2012, admitted 04/02/2012 with abdominal pain, nausea and vomiting. Work up done indicated SBO and pt has required Ex Lap for lysis of adhesions. He subsequently had an anastomotic leak with an abscess which has necessitated an omental patching of the anastomosis and percutaneous drain placement. The patient had an elevation of his white blood cell count and CT of the abdomen revealed an enlarging abscess an enteric fistula was obstructed drainage tube necessitated repositioning of the percutaneous drain a percutaneous drain. Presently the patient has an open abdominal wound which is connected with the abscess cavity and is being managed by general surgery and wound ostomy care. Repeat CT abdomen as noted below indicated resolution of abscess and per IR attempt was made 05/31/2012 to try place central line from subclavian or jugular vein but right upper extremity venogram demonstrated occlusion of right innominate vein. Therefore, a right arm midline catheter was placed with no immediate complication. This was needed in order to allow pt more mobility in order to proceed with discharge planning.  Principal Problem:  *SBO (small bowel obstruction)  - patient is s/p exploratory laparotomy and lysis of adhesions and partial small bowel x 2 on 7/24.  - Patient has had subsequent anastomotic leak - omental patch of anastomosis on 8/2.  - On 8/8, CT abdomen and pelvis demonstrated several fluid collections consistent with abscess, largest measuring 16.5 x 13.7 x 6.4 cm.  - Patient is status post percutaneous drainage of largest fluid collection 04/30/2012.  - Repeat CT done for  increasing WBC count on 8/13 revealed enlarging abscess and an enteric fistula with obstructed drainage tube.  - The drain was repositioned on 8/14. Since that time, he has had an open abdominal wound which connects to an abscess cavity, which has been slowly closing.  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  - Patient was not able to bend the right hip per interventional radiology but removing lines was planned in an attempt to give pt mor moility - will assess in AM progress  Chronic nonocclusive left lower extremity DVT.  - resolved  - Diagnosed via Doppler 04/30/2012 and follow up on 8/21 showed no evidence of DVT, superficial thrombosis or baker's cyst.   CKD (chronic kidney disease) stage V requiring chronic dialysis  - Dialysis per nephrology schedule  - back to [facility] times per [facility] schedule  HTN (hypertension),  - BP labile  but relatively controlled - Continue to monitor vitals per floor protocol   Anemia due to chronic disease  - Stable  - Has received a total of 4 units of packed red blood cells this admission  - Continue aranesp and ferric gluconate   GOUT  - Patient had been on pred 5mg  daily.  Weaned IV hydrocortisone. Currently at 25 mg daily.  - Wean to 20mg  daily 9/2 (equivalent to pred 5) and will continue until tolerating PO   Leg graft occlusion  - s/p thrombectomy 7/25  - unfortunately re clotted and undergone an additional thrombectomy procedure with revision of arterial and graft on 04/27/2012.   Severe protein calorie malnutrition  - Patient is cachectic  - Parenteral nutrition managed by surgery and pharmacy.   Consultants:  General surgery  Nephrology  Infectious disease   Procedures:  7/24 Ex-lap, lysis of adhesions, partial small bowel resection x2  7/25 Thrombectomy L femoral AV graft  7/26 Extubated, CVL is out  7/27 Transferred to telemetry under [facility]  8/2 Acute abdomen, dehiscence of anastomosis, SBO, back from OR intubated in ICU on PCCM service. Found to have moderate peritonitis infraumbilically  8/2 Extubated   Diagnostic procedures: US Renal 05/31/2012  IMPRESSION:   1.  Indeterminate left renal mass.  This could represent a cyst complicated by prior hemorrhage/infection, or a cystic neoplasm. Potential clinical strategies would include surveillance with  CT, further characterization with MRI, or surgical resection.  2.  Findings of medical renal disease.  3.  Left-sided pleural effusion.  4.  Left iliac fossa renal transplant with transplant cortical thinning and increased echogenicity, suggesting transplant failure.   Ir US Guide Vasc Access Right 05/31/2012   Impression: Right upper extremity venogram demonstrates occlusion of the right innominate vein.   As a result, a right jugular or right subclavian central line could not be placed.  Placement of a right upper arm PICC line with the tip in the right subclavian vein.     CT abdomen and pelvis 05/29/2012 IMPRESSION:  1. The patient's right lower quadrant abscess is now essentially completely resolved. A drainage catheter remains in place within the abscess cavity which is completely  decompressed.  2. There is there is a small filling defect associated with tip of the right femoral central venous catheter, which could represent catheter associated thrombus.  3. A 3.2 x 3.0 cm intermediate attenuation left renal lesion may simply represent a proteinaceous cyst.  4. Mild thickening and enhancement of the gallbladder wall. No evidence of gallstones. This finding is nonspecific, but not favored to represent acute cholecystitis.  5. Additional incidental findings, as above, similar to prior examinations.   Antibiotics:  Zosyn 8/1 >>> (tentative stop date of 9/16 with CT scan at that time)  Micafungin 8/3 >>8/16  Invanz 7/24 periop  Fluconazole- 8/2 + 8/17 >> (tentative stop date of 9/16 with CT scan at that time)  Vancomycin 8/8 >>> 05/01/2012  Code Status: Full  Family Communication: Pt at bedside over 60 minutes and sister over the phone  Disposition Plan: Likely SNF, problem is that tomorrow 09/10 pt's insurance will require copay and pt unable to afford, plan to d/c to SNF as soon as able to    HPI/Subjective: No events overnight.   Objective: Filed Vitals:   05/31/12 0903 05/31/12 1307 05/31/12 1653 05/31/12 2106  BP: 160/77 160/77 181/77 178/71  Pulse: 74 69 71 67  Temp: 97.6 F (36.4 C) 98.5 F (36.9 C) 97.6 F (36.4 C)  97.7 F (36.5 C)  TempSrc: Oral Oral Oral Oral  Resp: 16 15 17 17   Height:      Weight:    52.209 kg (115 lb 1.6 oz)  SpO2: 95% 98% 100% 97%    Intake/Output Summary (Last 24 hours) at 05/31/12 2238 Last data filed at 05/31/12 2100  Gross per 24 hour  Intake 1515.83 ml  Output     65 ml  Net 1450.83 ml    Exam:   General:  Pt is alert, follows commands appropriately, not in acute distress  Cardiovascular: Regular rate and rhythm, S1/S2, no murmurs, no rubs, no gallops  Respiratory: Clear to auscultation bilaterally, no wheezing, no crackles, no rhonchi  Abdomen: Soft, non tender, non distended, bowel sounds present, no  guarding  Extremities: No edema, pulses DP and PT palpable bilaterally  Neuro: Grossly nonfocal  Data Reviewed: Basic Metabolic Panel:  Lab 05/31/12 7829 05/29/12 0953 05/28/12 0555 05/27/12 0649 05/25/12 0907  NA 133* 127* 132* 128* 128*  K 3.1* 3.3* 3.8 3.4* 3.5  CL 93* 90* 93* 91* 90*  CO2 22 18* 26 19 24   GLUCOSE 88 86 104* 100* 97  BUN 56* 72* 41* 61* 50*  CREATININE 5.43* 5.76* 4.16* 5.35* 4.85*  CALCIUM 10.2 10.0 10.0 9.9 9.5  MG 1.6 -- -- 1.5 --  PHOS 2.9 3.1 -- 2.7 --   Liver Function Tests:  Lab 05/31/12 0650 05/29/12 0953 05/27/12 0649 05/25/12 0907  AST 37 -- 42* 36  ALT 34 -- 37 30  ALKPHOS 173* -- 170* 161*  BILITOT 1.5* -- 1.6* 1.6*  PROT 5.4* -- 5.6* 5.2*  ALBUMIN 1.7* 1.6* 1.6*1.7* 1.5*   No results found for this basename: LIPASE:5,AMYLASE:5 in the last 168 hours No results found for this basename: AMMONIA:5 in the last 168 hours CBC:  Lab 05/31/12 0650 05/29/12 0953 05/28/12 0555 05/27/12 0648 05/25/12 0907  WBC 6.3 7.0 6.3 7.2 6.2  NEUTROABS 3.5 -- -- -- --  HGB 12.7* 12.5* 12.8* 12.3* 11.9*  HCT 38.3* 36.5* 38.8* 36.5* 34.9*  MCV 83.4 80.9 82.6 81.7 81.5  PLT 129* 134* 120* 149* 110*   CBG:  Lab 05/31/12 1651 05/31/12 0757 05/31/12 0021 05/30/12 1623 05/30/12 0755  GLUCAP 113* 100* 103* 150* 91    Recent Results (from the past 240 hour(s))  MRSA PCR SCREENING     Status: Normal   Collection Time   05/31/12 10:18 AM      Component Value Range Status Comment   MRSA by PCR NEGATIVE  NEGATIVE Final      Scheduled Meds:   . antiseptic oral rinse  15 mL Mouth Rinse q12n4p  . feeding supplement (NEPRO CARB STEADY)  237 mL Oral TID BM  . feeding supplement  1 Container Oral Q24H  . fentaNYL  37.5 mcg Transdermal Q72H  . fluconazole (DIFLUCAN) IV  400 mg Intravenous Q T,Th,Sa-HD  . heparin  40 Units/kg Dialysis Once in dialysis  . hydrocortisone sod succinate (SOLU-CORTEF) injection  20 mg Intravenous Daily  . insulin aspart  0-15 Units  Subcutaneous Q8H  . labetalol  10 mg Intravenous Q6H  . lip balm   Topical BID  . pantoprazole (PROTONIX) IV  40 mg Intravenous Daily  . piperacillin-tazobactam (ZOSYN)  IV  2.25 g Intravenous Q8H  . simethicone  80 mg Oral QID  . sodium chloride  10-40 mL Intracatheter Q12H   Continuous Infusions:   . TPN (CLINIMIX) +/- additives 45 mL/hr at 05/31/12 1744  And  . fat emulsion 240 mL (05/31/12 1745)  . TPN (CLINIMIX) +/- additives 45 mL/hr at 05/30/12 1741     Debbora Presto, MD  [facility] Pager [phone]  If 7PM-7AM, please contact night-coverage [email or web address] Password [email or web address] 05/31/2012, 10:38 PM   LOS: 59 days

## 2012-05-31 NOTE — Procedures (Signed)
Right upper extremity venogram demonstrated occlusion of right innominate vein.  Unable to place a central line from subclavian or jugular vein.  Therefore, a right arm midline catheter was placed.  Tip in right subclavian vein.   No immediate complication.

## 2012-05-31 NOTE — Progress Notes (Signed)
34 Days Post-Op  Subjective: RLQ drain placed 8/9 Still has significant output CT 9/7 shows resolution of collection but stll draining Output fecalent  Objective: Vital signs in last 24 hours: Temp:  [97.6 F (36.4 C)-98.5 F (36.9 C)] 97.6 F (36.4 C) (09/09 0903) Pulse Rate:  [74-87] 74  (09/09 0903) Resp:  [16-17] 16  (09/09 0903) BP: (127-191)/(76-98) 160/77 mmHg (09/09 0903) SpO2:  [95 %-100 %] 95 % (09/09 0903) Weight:  [113 lb 15.7 oz (51.7 kg)] 113 lb 15.7 oz (51.7 kg) (09/08 2058) Last BM Date: 05/30/12  Intake/Output from previous day: 09/08 0701 - 09/09 0700 In: 2200.6 [P.O.:720; IV Piggyback:150; TPN:1330.6] Out: 6 [Drains:5; Stool:1] Intake/Output this shift:    PE:  Afeb; vss Wbc wnl Output 5cc in chart But approx 40 cc in bag now - fecalent material Site clean and dry  Lab Results:   [facility] 05/31/12 0650 05/29/12 0953  WBC 6.3 7.0  HGB 12.7* 12.5*  HCT 38.3* 36.5*  PLT 129* 134*   BMET  Basename 05/31/12 0650 05/29/12 0953  NA 133* 127*  K 3.1* 3.3*  CL 93* 90*  CO2 22 18*  GLUCOSE 88 86  BUN 56* 72*  CREATININE 5.43* 5.76*  CALCIUM 10.2 10.0   PT/INR No results found for this basename: LABPROT:2,INR:2 in the last 72 hours ABG No results found for this basename: PHART:2,PCO2:2,PO2:2,HCO3:2 in the last 72 hours  Studies/Results: Ct Abdomen Pelvis W Contrast  05/29/2012  *RADIOLOGY REPORT*  Clinical Data: Evaluate abscess.  History of renal failure.  CT ABDOMEN AND PELVIS WITH CONTRAST  Technique:  Multidetector CT imaging of the abdomen and pelvis was performed following the standard protocol during bolus administration of intravenous contrast.  Contrast: OMNIPAQUE IOHEXOL 300 MG/ML  SOLN  Comparison: CT of the abdomen and pelvis 05/04/2012.  Findings:  Lung Bases: Small bilateral pleural effusions with dependent atelectasis in the lower lobes of the lungs bilaterally (left greater than right).  Atherosclerotic calcifications in the  distal right coronary artery.  Abdomen/Pelvis:  Gallbladder is largely decompressed, but has Mora thickened enhancing wall.  This is nonspecific.  The appearance of the liver, pancreas, spleen and right adrenal gland is unremarkable.  There is Mora 1.8 x 1.1 cm nodule in the left adrenal gland which is unchanged.  Kidneys are atrophic bilaterally and there are numerous low attenuation lesions, most of which are too small to definitively characterize.  In addition, there is Mora 3.0 x 3.2 cm intermediate attenuation (28 HU) lesion in the interpolar region of the left kidney which is similar to recent prior examinations, but it is new compared to more remote prior examinations (example 11/01/2004).  Compared to the prior examinations of previously noted right lower quadrant and its abscess appears to have nearly completely resolved.  There is Mora large drainage catheter which extends throughout the previously noted abscess cavity, and little to no residual fluid remains around the catheter at this time.  There is Mora small amount of gas associated with the tip of the catheter, presumably iatrogenic.  The patient's left lower quadrant renal transplant kidney is markedly atrophic with dystrophic calcifications within the parenchyma.  No significant volume of ascites.  No pneumoperitoneum.  No pathologic distension of bowel.  There is Mora right femoral approach dialysis catheter in place extending to the proximal right common iliac vein.  Mora small filling defect associated with the tip of the catheter could suggest some catheter associated thrombus (image 53 of series 2) extensive atherosclerosis  of the abdominal and pelvic vasculature. Surgical clips in the low pelvis posterior to the urinary bladder.  Urinary bladder is nearly completely decompressed.  Musculoskeletal: Healing midline abdominal wound.  There are no aggressive appearing lytic or blastic lesions noted in the visualized portions of the skeleton.  IMPRESSION: 1.  The  patient's right lower quadrant abscess is now essentially completely resolved.  Mora drainage catheter remains in place within the abscess cavity which is completely decompressed. 2.  There is there is Mora small filling defect associated with tip of the right femoral central venous catheter, which could represent catheter associated thrombus. 3.  Mora 3.2 x 3.0 cm intermediate attenuation left renal lesion may simply represent Mora proteinaceous cyst.  However, this appears to have slightly increased progressively over recent prior examinations, and is clearly new compared to more remote prior CT studies from 2006.  Further evaluation with renal ultrasound is recommended to exclude Mora cystic renal neoplasm. 4.  Mild thickening and enhancement of the gallbladder wall.  No evidence of gallstones.  This finding is nonspecific, but not favored to represent acute cholecystitis. 5.  Additional incidental findings, as above, similar to prior examinations.   Original Report Authenticated By: Florencia Reasons, M.D.    US Renal  05/31/2012  *RADIOLOGY REPORT*  Clinical Data: Evaluate abnormality of left kidney.  RENAL/URINARY TRACT ULTRASOUND COMPLETE  Comparison:  CT of 05/29/2012 and 04/03/2012  Findings:  Right Kidney:   6.3 cm.  Moderate cortical thinning and increased echogenicity.  Small simple appearing lesions within are likely cysts.  Left Kidney:  7.2 cm.  Moderate renal cortical thinning and increased echogenicity.  Corresponding to the CT abnormality, in the interpolar left kidney, is Mora 2.6 x 2.6 x 2.8 cm lesion.  This is centrally anechoic with increased through transmission.  This has Mora heterogeneously thickened wall with Mora suggestion of vascularity on image 29 superiorly.  Bladder:  Within normal limits.  Mora left-sided pleural effusion is incidentally noted.  Left iliac fossa renal transplant demonstrates increased echogenicity with an interpolar simple appearing cyst.  There is cortical thinning of the renal transplant  as well.  IMPRESSION:  1.  Indeterminate left renal mass.  This could represent Mora cyst complicated by prior hemorrhage/infection, or Mora cystic neoplasm. Potential clinical strategies would include surveillance with  CT, further characterization with MRI, or surgical resection. 2.  Findings of medical renal disease. 3.  Left-sided pleural effusion. 4.  Left iliac fossa renal transplant with transplant cortical thinning and increased echogenicity, suggesting transplant failure.   Original Report Authenticated By: Consuello Bossier, M.D.     Anti-infectives:   Assessment/Plan: s/p Procedure(s) (LRB) with comments: THROMBECTOMY ARTERIOVENOUS GORE-TEX GRAFT (Left) - Thrombectomy of left thigh arteriovenous gortex graft REVISION OF ARTERIOVENOUS GORETEX GRAFT (Left) - using 52mmx10cm Gore-Tex Vascular Graft  RLQ 8/9 drain CT shows resolution (9/7) But has significant fecalent output Will follow Plan per [facility]  [name] 05/31/2012

## 2012-05-31 NOTE — H&P (Signed)
Cc: indwelling right femoral catheter used for TNA/ IV antibiotics affecting mobility. Request has been made for removal of this catheter and replacement to better location that will not affect his mobility. IR to evaluate the right jugular area for possible tunneled catheter placement.   PMH:  Past Medical History  Diagnosis Date  . Dialysis patient   . Prostate cancer   . Renal insufficiency     Medication List  As of 05/31/2012 12:43 PM   ASK your doctor about these medications         colchicine 0.6 MG tablet   Take 0.6 mg by mouth daily.      lanthanum 1000 MG chewable tablet   Commonly known as: FOSRENOL   Chew 2,000 mg by mouth 3 (three) times daily with meals.      multivitamin Tabs tablet   Take 1 tablet by mouth daily.      predniSONE 5 MG tablet   Commonly known as: DELTASONE   Take 5 mg by mouth daily.            HPI:   See note below from renal team :  Stephen Krabbe, MD Physician Signed Nephrology Progress Notes 05/31/2012 9:50 AM     [facility]  Progress Note   Subjective:  Having lots of gas.  Trying to sip nepro - drinking it fast causes diarrhea. Last HD Saturday.  Objective Filed Vitals:     05/30/12 2058  05/31/12 0310  05/31/12 0409  05/31/12 0903   BP:  169/83  191/88  190/84  160/77   Pulse:  87    76  74   Temp:  98 F (36.7 C)    97.8 F (36.6 C)  97.6 F (36.4 C)   TempSrc:  Oral    Oral  Oral   Resp:  16    16  16    Height:           Weight:  51.7 kg (113 lb 15.7 oz)         SpO2:  100%    100%  95%    Physical Exam General: Eating greits. Heart: RRR Lungs: no wheezes or rales Abdomen: soft Extremities: right LQ triple lumen infusing TNA and right LQ drain with dark liquid Dialysis Access: left thigh AVGG patent  Assessment/Plan: 1. SBO - s/p surg/dehisc/abscess with controlled EC fistula and pelvic drain, CCS managing on PO liquids and IV Zosyn & Diflucan, still requiring TNA; taking some liquids  2. ESRD -  HD on TTS @ [facility], no Hep HD per surg request; using 4 K bath; no K restriction in diet at present  3. Anemia - Hgb 12.7, holding aranesp / no iron.  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 HTN/volume - BP most recently 167/88, IV labetalol 10 mg q 6 hours; volume stable now with hd 3x per wk.  ? Change to po meds  6. Malnutrition - on Full liquids and TNA, Alb 1.7, but prealbumin 24.7 9/02).  Dr. Arlean Hopping is concerned about limited mobility due to femoral catheter, and also about the suggestion or small area of possible clot on the tip of the catheter. I agree with him and  think we should take out the triple lumen large-bore dialysis catheter in the R groin, give the  vein a 1 or 2 day holiday, then have IR reevaluate the upper extremities for a central or PICC line; then if that is not available, place a standard central line back into the R femoral vein, something that would not prohibit him from getting out of bed and walking. I discusses with the pt and said we would likely make a decision today. Discussed with Dr. Lowella Dandy of Int Radiology- he will bring [name] down today or tomorrow and evaluate the situation.  He said he think that the R IJ should be open.  If nothing can be found in the neck , they will replace the HD cath with a smaller bore central line in the R groin.  7. Left renal mass - follow conservatively for now with serial CTs  8. Gout - stable, on IV Solu-Cortef   Sheffield Slider, PA-C [facility] [phone]         05/31/2012,9:51 AM LOS: 59 days    Additional Objective Labs: Basic Metabolic Panel:  Lab  05/31/12 0650  05/29/12 0953  05/28/12 0555  05/27/12 0649   NA  133*  127*  132*  --   K  3.1*  3.3*  3.8  --   CL  93*  90*  93*  --   CO2  22  18*  26  --   GLUCOSE  88  86  104*  --   BUN  56*  72*  41*  --   CREATININE   5.43*  5.76*  4.16*  --   CALCIUM  10.2  10.0  10.0  --   ALB  --  --  --  --   PHOS  2.9  3.1  --  2.7    Liver Function Tests:  Lab  05/31/12 0650  05/29/12 0953  05/27/12 0649  05/25/12 0907   AST  37  --  42*  36   ALT  34  --  37  30   ALKPHOS  173*  --  170*  161*   BILITOT  1.5*  --  1.6*  1.6*   PROT  5.4*  --  5.6*  5.2*   ALBUMIN  1.7*  1.6*  1.6*1.7*  --    CBC:  Lab  05/31/12 0650  05/29/12 0953  05/28/12 0555  05/27/12 0648  05/25/12 0907   WBC  6.3  7.0  6.3  --  --   NEUTROABS  3.5  --  --  --  --   HGB  12.7*  12.5*  12.8*  --  --   HCT  38.3*  36.5*  38.8*  --  --   MCV  83.4  80.9  82.6  81.7  81.5   PLT  129*  134*  120*  --  --     Lab  05/31/12 0757  05/31/12 0021  05/30/12 1623  05/30/12 0755  05/29/12 2355   GLUCAP  100*  103*  150*  91  127*    US Renal  05/31/2012  *RADIOLOGY REPORT*  Clinical Data: Evaluate abnormality of left kidney.  RENAL/URINARY TRACT ULTRASOUND COMPLETE  Comparison:  CT of 05/29/2012 and 04/03/2012  Findings:  Right Kidney:   6.3 cm.  Moderate cortical thinning and increased echogenicity.  Small simple appearing lesions within are likely cysts.  Left Kidney:  7.2 cm.  Moderate renal cortical thinning and increased echogenicity.  Corresponding to the CT abnormality, in the interpolar left kidney,  is a 2.6 x 2.6 x 2.8 cm lesion.  This is centrally anechoic with increased through transmission.  This has a heterogeneously thickened wall with a suggestion of vascularity on image 29 superiorly.  Bladder:  Within normal limits.  A left-sided pleural effusion is incidentally noted.  Left iliac fossa renal transplant demonstrates increased echogenicity with an interpolar simple appearing cyst.  There is cortical thinning of the renal transplant as well.  IMPRESSION:  1.  Indeterminate left renal mass.  This could represent a cyst complicated by prior hemorrhage/infection, or a cystic neoplasm. Potential clinical strategies would include  surveillance with  CT, further characterization with MRI, or surgical resection. 2.  Findings of medical renal disease. 3.  Left-sided pleural effusion. 4.  Left iliac fossa renal transplant with transplant cortical thinning and increased echogenicity, suggesting transplant failure.   Original Report Authenticated By: Consuello Bossier, M.D.     Medications:     .  TPN (CLINIMIX) +/- additives  45 mL/hr at 05/29/12 1701   .  TPN (CLINIMIX) +/- additives  45 mL/hr at 05/30/12 1741        .  antiseptic oral rinse   15 mL  Mouth Rinse  q12n4p   .  feeding supplement (NEPRO CARB STEADY)   237 mL  Oral  TID BM   .  feeding supplement   1 Container  Oral  Q24H   .  fentaNYL   37.5 mcg  Transdermal  Q72H   .  fluconazole (DIFLUCAN) IV   400 mg  Intravenous  Q T,Th,Sa-HD   .  heparin   40 Units/kg  Dialysis  Once in dialysis   .  hydrocortisone sod succinate (SOLU-CORTEF) injection   20 mg  Intravenous  Daily   .  insulin aspart   0-15 Units  Subcutaneous  Q8H   .  labetalol   10 mg  Intravenous  Q6H   .  lip balm     Topical  BID   .  pantoprazole (PROTONIX) IV   40 mg  Intravenous  Daily   .  piperacillin-tazobactam (ZOSYN)  IV   2.25 g  Intravenous  Q8H   .  simethicone   80 mg  Oral  QID   .  sodium chloride   10-40 mL  Intracatheter  Q12H        Results for KARAN, BOSQUE (MRN [id number]) as of 05/31/2012 12:22  Ref. Range 05/13/2012 18:19  Prothrombin Time Latest Range: 11.6-15.2 seconds 15.1  INR Latest Range: 0.00-1.49  1.17   Allergies :  Allopurinol, ASA   PE:  Blood pressure 160/77, pulse 74, temperature 97.6 F (36.4 C), temperature source Oral, resp. rate 16, height 5\' 11"  (1.803 m), weight 113 lb 15.7 oz (51.7 kg), SpO2 95.00%.  Awake, alert, NAD.  Thin appearing.  CV: RRR, Resp: CTA bilaterally.  Right femoral catheter secured to skin , intact, clean and dry. RLQ drain also intact. Airway = 2.    A/P:  Patient with ESRD with poor venous access. Has a left leg AVG that  functions for HD>  Current femoral catheter intact infusing TNA and anti-biotics - for possible removal to replace in the neck area vs replacement to a smaller bore tunneled catheter to allow ambulation for patient.   Patient presents to the radiology department. He was made aware that US imaging will be performed over the right IJ area to determine if amendable to tunneled catheter placement.  Patient aware of  details, risks and benefits.  Plan to place new catheter today in IR. Written consent obtained.

## 2012-05-31 NOTE — ED Notes (Signed)
Patient will be receiving a PICC line versus a tunneled catheter.  No sedation needed.

## 2012-06-01 ENCOUNTER — Inpatient Hospital Stay (HOSPITAL_COMMUNITY): Payer: Medicare Other

## 2012-06-01 LAB — RENAL FUNCTION PANEL
Albumin: 1.6 g/dL — ABNORMAL LOW (ref 3.5–5.2)
GFR calc Af Amer: 10 mL/min — ABNORMAL LOW (ref >90)
Glucose, Bld: 92 mg/dL (ref 70–99)
Phosphorus: 3.4 mg/dL (ref 2.3–4.6)
Potassium: 3.2 mEq/L — ABNORMAL LOW (ref 3.5–5.1)
Sodium: 130 mEq/L — ABNORMAL LOW (ref 135–145)

## 2012-06-01 LAB — CBC
Hemoglobin: 11.8 g/dL — ABNORMAL LOW (ref 13.0–17.0)
MCHC: 33.4 g/dL (ref 30.0–36.0)
RDW: 18.1 % — ABNORMAL HIGH (ref 11.5–15.5)

## 2012-06-01 LAB — GLUCOSE, CAPILLARY
Glucose-Capillary: 109 mg/dL — ABNORMAL HIGH (ref 70–99)
Glucose-Capillary: 92 mg/dL (ref 70–99)

## 2012-06-01 MED ORDER — MORPHINE SULFATE 2 MG/ML IJ SOLN
INTRAMUSCULAR | Status: AC
  Administered 2012-06-01: 2 mg via INTRAVENOUS
  Filled 2012-06-01: qty 1

## 2012-06-01 MED ORDER — INSULIN REGULAR HUMAN 100 UNIT/ML IJ SOLN
INTRAVENOUS | Status: AC
  Administered 2012-06-01: 17:00:00 via INTRAVENOUS
  Filled 2012-06-01: qty 2000

## 2012-06-01 NOTE — Progress Notes (Signed)
Off unit.

## 2012-06-01 NOTE — Progress Notes (Signed)
Patient ID: [name], male   DOB: July 12, 1961, 51 y.o.   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 Work up done indicated SBO and pt has required Ex Lap for lysis of adhesions. He subsequently had an anastomotic leak with an abscess which has necessitated an omental patching of the anastomosis and percutaneous drain placement. The patient had an elevation of his white blood cell count and CT of the abdomen revealed an enlarging abscess an enteric fistula was obstructed drainage tube necessitated repositioning of the percutaneous drain a percutaneous drain. Presently the patient has an open abdominal wound which is connected with the abscess cavity and is being managed by general surgery and wound ostomy care. Repeat CT abdomen as noted below indicated resolution of abscess and per IR attempt was made 05/31/2012 to try place central line from subclavian or jugular vein but right upper extremity venogram demonstrated occlusion of right innominate vein. Therefore, a right arm midline catheter was placed with no immediate complication. This was needed in order to allow pt more mobility in order to proceed with discharge planning. However,in the hospital this line is not adequate for feeding so we are waiting to hear from SNF if they are OK with that type of that line (subclavicular line) for feeding and if they are then pt can be discharged to the SNF in next 1-2 days. If they do not accept that line adequate than plan is to have IR remove current femoral access and replace it with tunneled cath in the groin area as well and se that as feeding access.   Principal Problem:  *SBO (small bowel obstruction)  - patient  is s/p exploratory laparotomy and lysis of adhesions and partial small bowel x 2 on 7/24.  - Patient has had subsequent anastomotic leak  - omental patch of anastomosis on 8/2.  - On 8/8, CT abdomen and pelvis demonstrated several fluid collections consistent with abscess, largest measuring 16.5 x 13.7 x 6.4 cm.  - Patient is status post percutaneous drainage of largest fluid collection 04/30/2012.  - Repeat CT done for increasing WBC count on 8/13 revealed enlarging abscess and an enteric fistula with obstructed drainage tube.  - The drain was repositioned on 8/14. Since that time, he has had an open abdominal wound which connects to an abscess cavity, which has been slowly closing.  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  - Patient was not  able to bend the right hip per interventional radiology but removing lines was planned in an attempt to give pt more moility  - please see above plan for feeding access - will assess in AM progress   Chronic nonocclusive left lower extremity DVT.  - resolved  - Diagnosed via Doppler 04/30/2012 and follow up on 8/21 showed no evidence of DVT, superficial thrombosis or  baker's cyst.   CKD (chronic kidney disease) stage V requiring chronic dialysis  - Dialysis per nephrology schedule  - back to [facility] times per week schedule   HTN (hypertension),  - BP labile but relatively controlled  - Continue to monitor vitals per floor protocol   Anemia due to chronic disease  - Stable  - Has received a total of 4 units of packed red blood cells this admission  - Continue aranesp and ferric gluconate   GOUT  - Patient had been on pred 5mg  daily. Weaned IV hydrocortisone. Currently at 25 mg daily.  - Wean to 20mg  daily 9/2 (equivalent to pred 5) and will continue until tolerating PO   Leg graft occlusion  - s/p thrombectomy 7/25  - unfortunately re clotted and undergone an additional thrombectomy procedure with revision of arterial and graft on 04/27/2012.   Severe protein calorie malnutrition  - Patient is cachectic  - Parenteral nutrition managed by surgery and pharmacy.   Consultants:  General surgery  Nephrology  Infectious disease   Procedures:  7/24 Ex-lap, lysis of adhesions, partial small bowel resection x2  7/25 Thrombectomy L femoral AV graft  7/26 Extubated, CVL is out  7/27 Transferred to telemetry under [facility]  8/2 Acute abdomen, dehiscence of anastomosis, SBO, back from OR intubated in ICU on PCCM service. Found to have moderate peritonitis infraumbilically  8/2 Extubated   Diagnostic procedures:  US Renal  05/31/2012  IMPRESSION:  1. Indeterminate left renal mass. This could represent a cyst complicated by prior hemorrhage/infection, or a cystic neoplasm. Potential clinical strategies would include surveillance with CT, further characterization with MRI, or surgical resection.  2. Findings of medical renal disease.  3. Left-sided pleural effusion.  4. Left iliac fossa renal transplant with transplant cortical thinning and increased echogenicity, suggesting transplant failure.   Ir US Guide Vasc Access Right  05/31/2012  Impression:    Right upper extremity venogram demonstrates occlusion of the right innominate vein.  As a result, a right jugular or right subclavian central line could not be placed. Placement of a right upper arm PICC line with the tip in the right subclavian vein.   CT abdomen and pelvis  05/29/2012  IMPRESSION:  1. The patient's right lower quadrant abscess is now essentially completely resolved. A drainage catheter remains in place within the abscess cavity which is completely decompressed.  2. There is there is a small filling defect associated with tip of the right femoral central venous catheter, which could represent catheter associated thrombus.  3. A 3.2 x 3.0 cm intermediate attenuation left renal lesion may simply represent a proteinaceous cyst.  4. Mild thickening and enhancement of the gallbladder wall. No evidence of gallstones. This finding is nonspecific, but not favored to represent acute cholecystitis.  5. Additional incidental findings, as above, similar to prior examinations.   Antibiotics:  Zosyn 8/1 >>> (tentative stop date of 9/16 with CT scan at that time)  Micafungin 8/3 >>8/16  Invanz 7/24 periop  Fluconazole- 8/2 + 8/17 >> (tentative stop date of 9/16 with CT scan at that time)  Vancomycin  8/8 >>> 05/01/2012   Code Status: Full  Family Communication: Pt at bedside over 60 minutes and sister over the phone  Disposition Plan: Likely SNF if they accept the current subclavicular line for feeding access, if they don't then current femoral line will be removed in AM and will need to be replaced by tunneled cath in the groin area as well and that would likely be done 1-2 days after current femoral line removed, so likely plan for d/c is this Friday  HPI/Subjective: No events overnight.   Objective: Filed Vitals:   06/01/12 1530 06/01/12 1600 06/01/12 1603 06/01/12 1639  BP: 104/67 89/44 149/86 127/63  Pulse: 73  81 85  Temp:   97 F (36.1 C) 97.1 F (36.2 C)  TempSrc:   Oral    Resp:   19 16  Height:      Weight:   48.3 kg (106 lb 7.7 oz)   SpO2:   99% 100%    Intake/Output Summary (Last 24 hours) at 06/01/12 1800 Last data filed at 06/01/12 1603  Gross per 24 hour  Intake 1869.17 ml  Output   3384 ml  Net -1514.83 ml    Exam:   General:  Pt is alert, follows commands appropriately, not in acute distress, cachectic  Cardiovascular: Regular rate and rhythm, S1/S2, no murmurs, no rubs, no gallops  Respiratory: Clear to auscultation bilaterally, no wheezing, no crackles, no rhonchi  Abdomen: Soft, non tender, non distended, bowel sounds present, no guarding  Extremities: No edema, pulses DP and PT palpable bilaterally  Neuro: Grossly nonfocal  Data Reviewed: Basic Metabolic Panel:  Lab 06/01/12 6578 05/31/12 0650 05/29/12 0953 05/28/12 0555 05/27/12 0649  NA 130* 133* 127* 132* 128*  K 3.2* 3.1* 3.3* 3.8 3.4*  CL 92* 93* 90* 93* 91*  CO2 22 22 18* 26 19  GLUCOSE 92 88 86 104* 100*  BUN 73* 56* 72* 41* 61*  CREATININE 6.77* 5.43* 5.76* 4.16* 5.35*  CALCIUM 9.9 10.2 10.0 10.0 9.9  MG -- 1.6 -- -- 1.5  PHOS 3.4 2.9 3.1 -- 2.7   Liver Function Tests:  Lab 06/01/12 0430 05/31/12 0650 05/29/12 0953 05/27/12 0649  AST -- 37 -- 42*  ALT -- 34 -- 37  ALKPHOS -- 173* -- 170*  BILITOT -- 1.5* -- 1.6*  PROT -- 5.4* -- 5.6*  ALBUMIN 1.6* 1.7* 1.6* 1.6*1.7*   No results found for this basename: LIPASE:5,AMYLASE:5 in the last 168 hours No results found for this basename: AMMONIA:5 in the last 168 hours CBC:  Lab 06/01/12 0430 05/31/12 0650 05/29/12 0953 05/28/12 0555 05/27/12 0648  WBC 6.2 6.3 7.0 6.3 7.2  NEUTROABS -- 3.5 -- -- --  HGB 11.8* 12.7* 12.5* 12.8* 12.3*  HCT 35.3* 38.3* 36.5* 38.8* 36.5*  MCV 81.9 83.4 80.9 82.6 81.7  PLT 118* 129* 134* 120* 149*   Cardiac Enzymes: No results found for this basename: CKTOTAL:5,CKMB:5,CKMBINDEX:5,TROPONINI:5 in the last 168 hours BNP: No components found with this basename:  POCBNP:5 CBG:  Lab 06/01/12 1638 06/01/12 0801 06/01/12 0005 05/31/12 1651 05/31/12 0757  GLUCAP 92 83 109* 113* 100*    Recent Results (from the past 240 hour(s))  MRSA PCR SCREENING     Status: Normal   Collection Time   05/31/12 10:18 AM      Component Value Range Status Comment   MRSA by PCR NEGATIVE  NEGATIVE Final      Scheduled Meds:   . antiseptic oral rinse  15 mL  Mouth Rinse q12n4p  . feeding supplement (NEPRO CARB STEADY)  237 mL Oral TID BM  . feeding supplement  1 Container Oral Q24H  . fentaNYL  37.5 mcg Transdermal Q72H  . fluconazole (DIFLUCAN) IV  400 mg Intravenous Q T,Th,Sa-HD  . heparin  40 Units/kg Dialysis Once in dialysis  . hydrocortisone sod succinate (SOLU-CORTEF) injection  20 mg Intravenous Daily  . insulin aspart  0-15 Units Subcutaneous Q8H  . labetalol  10 mg Intravenous Q6H  . lip balm   Topical BID  . pantoprazole (PROTONIX) IV  40 mg Intravenous Daily  . piperacillin-tazobactam (ZOSYN)  IV  2.25 g Intravenous Q8H  . simethicone  80 mg Oral QID  . sodium chloride  10-40 mL Intracatheter Q12H   Continuous Infusions:   . TPN (CLINIMIX) +/- additives 45 mL/hr at 05/31/12 1744   And  . fat emulsion 240 mL (05/31/12 1745)  . TPN (CLINIMIX) +/- additives 45 mL/hr at 06/01/12 1719     Debbora Presto, MD  [facility] Pager [phone]  If 7PM-7AM, please contact night-coverage [email or web address] Password [email or web address] 06/01/2012, 6:00 PM   LOS: 60 days

## 2012-06-01 NOTE — Progress Notes (Signed)
[facility] Progress Note  Subjective:  Now with right arm picc.  Still with triple lumen groin catheter with TNA infusing.  Pt unclear of IV access plan. Ate very little breakfast because he does not like to eat before HD.  Objective Filed Vitals:   05/31/12 2106 06/01/12 0312 06/01/12 0530 06/01/12 0915  BP: 178/71 156/62 172/73 156/121  Pulse: 67 76 65 74  Temp: 97.7 F (36.5 C)  97.9 F (36.6 C) 97.5 F (36.4 C)  TempSrc: Oral  Oral   Resp: 17  16 18   Height:      Weight: 52.209 kg (115 lb 1.6 oz)     SpO2: 97%  99% 98%   Physical Exam General: NAD Heart: RRR Lungs: no wheezes or rales Abdomen: sof Extremities: no LE edema; tright LQ triple lumen infusing TNA and right LQ drain with dark liquid; right upper arm PICC Dialysis Access: left thigh graft patent   Assessment/Plan: 1. SBO - s/p surg/dehisc/abscess with controlled EC fistula and pelvic drain, CCS managing on PO liquids and IV Zosyn & Diflucan, still requiring TNA; taking some liquids 2. ESRD - HD on TTS @ [facility], no Hep HD per surg request; using 4 K bath; no K restriction in diet at present - K still low at 3.2 3. Anemia - Hgb 11.8, holding aranesp / no iron. 4. 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 HTN/volume - BP most recently 167/88, IV labetalol 10 mg q 6 hours; volume stable now with hd 3x per wk. ? Change to po meds. 4 kg off with HD today. 6. Malnutrition - on Full liquids and TNA, Alb 1.6, but prealbumin 24.7 9/02). Midline PICC placed yesterday in R arm by Dr. 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  I have talked with director of IV team regarding whether or not we can use this line for TNA, which is why it was put in.  She said she would discuss with Dr. Lowella Dandy.  I have d/w Dr. Verta Ellen also.  7. Left renal mass - follow conservatively for now with  serial CTs 8. Gout - stable, on IV Solu-Cortef 9. Thrombocytopenia - platelets trending down - follow   Sheffield Slider, PA-C [facility] Beeper [phone]  06/01/2012,10:02 AM  LOS: 60 days   Patient seen and examined and agree with assessment and plan as above with additions as indicated.  Vinson Moselle  MD [facility] [phone] pgr    [phone] cell 06/01/2012, 1:13 PM      Additional Objective Labs: Basic Metabolic Panel:  Lab 06/01/12 5284 05/31/12 0650 05/29/12 0953  NA 130* 133* 127*  K 3.2* 3.1* 3.3*  CL 92* 93* 90*  CO2 22 22 18*  GLUCOSE 92 88 86  BUN 73* 56* 72*  CREATININE 6.77* 5.43* 5.76*  CALCIUM 9.9 10.2 10.0  ALB -- -- --  PHOS 3.4 2.9 3.1   Liver Function Tests:  Lab 06/01/12 0430 05/31/12 0650 05/29/12 0953 05/27/12 0649  AST -- 37 -- 42*  ALT -- 34 -- 37  ALKPHOS -- 173* -- 170*  BILITOT -- 1.5* -- 1.6*  PROT -- 5.4* -- 5.6*  ALBUMIN 1.6* 1.7* 1.6* --   CBC:  Lab 06/01/12 0430 05/31/12 0650 05/29/12 0953 05/28/12 0555 05/27/12 0648  WBC 6.2  6.3 7.0 -- --  NEUTROABS -- 3.5 -- -- --  HGB 11.8* 12.7* 12.5* -- --  HCT 35.3* 38.3* 36.5* -- --  MCV 81.9 83.4 80.9 82.6 81.7  PLT 118* 129* 134* -- --  US Renal  05/31/2012  *RADIOLOGY REPORT*  Clinical Data: Evaluate abnormality of left kidney.  RENAL/URINARY TRACT ULTRASOUND COMPLETE  Comparison:  CT of 05/29/2012 and 04/03/2012  Findings:  Right Kidney:   6.3 cm.  Moderate cortical thinning and increased echogenicity.  Small simple appearing lesions within are likely cysts.  Left Kidney:  7.2 cm.  Moderate renal cortical thinning and increased echogenicity.  Corresponding to the CT abnormality, in the interpolar left kidney, is a 2.6 x 2.6 x 2.8 cm lesion.  This is centrally anechoic with increased through transmission.  This has a heterogeneously thickened wall with a suggestion of vascularity on image 29 superiorly.  Bladder:  Within normal limits.  A left-sided pleural  effusion is incidentally noted.  Left iliac fossa renal transplant demonstrates increased echogenicity with an interpolar simple appearing cyst.  There is cortical thinning of the renal transplant as well.  IMPRESSION:  1.  Indeterminate left renal mass.  This could represent a cyst complicated by prior hemorrhage/infection, or a cystic neoplasm. Potential clinical strategies would include surveillance with  CT, further characterization with MRI, or surgical resection. 2.  Findings of medical renal disease. 3.  Left-sided pleural effusion. 4.  Left iliac fossa renal transplant with transplant cortical thinning and increased echogenicity, suggesting transplant failure.   Original Report Authenticated By: Consuello Bossier, M.D.    I Medications:    . TPN (CLINIMIX) +/- additives 45 mL/hr at 05/31/12 1744   And  . fat emulsion 240 mL (05/31/12 1745)  . TPN (CLINIMIX) +/- additives 45 mL/hr at 05/30/12 1741      . antiseptic oral rinse  15 mL Mouth Rinse q12n4p  . feeding supplement (NEPRO CARB STEADY)  237 mL Oral TID BM  . feeding supplement  1 Container Oral Q24H  . fentaNYL  37.5 mcg Transdermal Q72H  . fluconazole (DIFLUCAN) IV  400 mg Intravenous Q T,Th,Sa-HD  . heparin  40 Units/kg Dialysis Once in dialysis  . hydrocortisone sod succinate (SOLU-CORTEF) injection  20 mg Intravenous Daily  . insulin aspart  0-15 Units Subcutaneous Q8H  . labetalol  10 mg Intravenous Q6H  . lip balm   Topical BID  . pantoprazole (PROTONIX) IV  40 mg Intravenous Daily  . piperacillin-tazobactam (ZOSYN)  IV  2.25 g Intravenous Q8H  . simethicone  80 mg Oral QID  . sodium chloride  10-40 mL Intracatheter Q12H

## 2012-06-01 NOTE — Clinical Social Work Note (Signed)
CSW spoke by phone with patient's sister and gave bed offers: [facility] and [facility]. Sister, [place] chose [facility] as it is very convenient to her. CSW also talked with patient regarding bed offers and also advised him of conversation with sister and her preference. Patient is understandably concerned about the level of care he will receive and CSW listened, offered support and answered questions.  Patient's sister arrived while CSW and patient were talking and she is also concerned about patient's concerns. After more discussion, patient is ok with going to [facility]. He requested, and sister will check the facilities ratings. Ms. Clovis Riley also advised CSW and patient that she will visit facility this evening.  CSW will continue to monitor patient progress and facilitate discharge to SNF when patient medically stable.  Genelle Bal, MSW, LCSW [phone]

## 2012-06-01 NOTE — Progress Notes (Signed)
35 Days Post-Op  Subjective: Picc line place yesterday, no real change.  No complaints, not sure what they are going to do next.  Objective: Vital signs in last 24 hours: Temp:  [97.6 F (36.4 C)-98.5 F (36.9 C)] 97.9 F (36.6 C) (09/10 0530) Pulse Rate:  [65-77] 65  (09/10 0530) Resp:  [15-17] 16  (09/10 0530) BP: (156-189)/(62-87) 172/73 mmHg (09/10 0530) SpO2:  [95 %-100 %] 99 % (09/10 0530) Weight:  [52.209 kg (115 lb 1.6 oz)] 52.209 kg (115 lb 1.6 oz) (09/09 2106) Last BM Date: 05/30/12  74 ml recorded from drain yesterday, Diet: Full liquids, and TNA, afebrile, VSS, BP up some, labs ?stable [place] yesterday shows occulusion of right innominate vein.with collaterals, PICC place Right upper arm. Intake/Output from previous day: 09/09 0701 - 09/10 0700 In: 1790.2 [P.O.:420; IV Piggyback:150; TPN:1210.2] Out: 74 [Drains:74] Intake/Output this shift:    General appearance: alert, cooperative, no distress and seems somewhat depressed. GI: soft, non-tender; bowel sounds normal; no masses,  no organomegaly and open site covered with dressing.  +BM yesterday, on full liquids, Drain bag empty, but much more recorded yesterday.  Drark brown-black colored fluid.  Lab Results:   Basename 06/01/12 0430 05/31/12 0650  WBC 6.2 6.3  HGB 11.8* 12.7*  HCT 35.3* 38.3*  PLT 118* 129*    BMET  Basename 06/01/12 0430 05/31/12 0650  NA 130* 133*  K 3.2* 3.1*  CL 92* 93*  CO2 22 22  GLUCOSE 92 88  BUN 73* 56*  CREATININE 6.77* 5.43*  CALCIUM 9.9 10.2   PT/INR No results found for this basename: LABPROT:2,INR:2 in the last 72 hours   Lab 06/01/12 0430 05/31/12 0650 05/29/12 0953 05/27/12 0649 05/25/12 0907  AST -- 37 -- 42* 36  ALT -- 34 -- 37 30  ALKPHOS -- 173* -- 170* 161*  BILITOT -- 1.5* -- 1.6* 1.6*  PROT -- 5.4* -- 5.6* 5.2*  ALBUMIN 1.6* 1.7* 1.6* 1.6*1.7* 1.5*     Lipase  No results found for this basename: lipase     Studies/Results: Ir Veno/ext/uni  Right  05/31/2012  *RADIOLOGY REPORT*  Clinical history:51 year old with known multiple medical problems including end-stage renal disease and malnutrition. Placement of a right upper extremity catheter was discussed with nephrology prior to the procedure.  PROCEDURE(S): RIGHT UPPER EXTREMITY VENOGRAM; PLACEMENT OF A RIGHT ARM MIDLINE CATHETER WITH ULTRASOUND AND FLUOROSCOPIC GUIDANCE  Physician: Rachelle Hora. Henn, MD  Medications:None  Moderate sedation time:None  Fluoroscopy time: 3.3 minutes  Contrast:  15 ml Omnipaque-300  Procedure:The procedure was explained to the patient.  The risks and benefits of the procedure were discussed and the patient's questions were addressed.  Informed consent was obtained from the patient.  The right upper arm was evaluated with ultrasound.  The patient had a patent right brachial vein.  The right upper arm was prepped and draped in a sterile fashion.  Maximal barrier sterile technique was utilized including caps, mask, sterile gowns, sterile gloves, sterile drape, hand hygiene and skin antiseptic.  Skin was anesthetized with lidocaine.  21 gauge needle was directed into the brachial vein with ultrasound guidance and a wire was advanced centrally.  The micropuncture dilator set was placed.  A right upper extremity venogram was performed.  A 5-French Kumpe catheter could not be easily advanced.  A 0.018 wire was manipulated into a larger vein within the upper arm and a peel-away sheath was placed. A dual lumen PICC line was cut to 30 cm and  advanced through the peel-away sheath.  Catheter tip was placed in the lateral right subclavian vein.  Follow-up venogram was obtained.  Catheter was sutured to the skin and flushed with normal saline.  Findings:Right upper extremity venogram demonstrates occlusion of the right innominate vein.  There are large collateral branches in the right hemithorax.  There also multiple collateral vessels in the right paraspinal region.  The catheter tip was  placed in the lateral right subclavian vein.  Complications: None  Impression:Right upper extremity venogram demonstrates occlusion of the right innominate vein.  As a result, a right jugular or right subclavian central line could not be placed.  Placement of a right upper arm PICC line with the tip in the right subclavian vein.   Original Report Authenticated By: Richarda Overlie, M.D.    US Renal  05/31/2012  *RADIOLOGY REPORT*  Clinical Data: Evaluate abnormality of left kidney.  RENAL/URINARY TRACT ULTRASOUND COMPLETE  Comparison:  CT of 05/29/2012 and 04/03/2012  Findings:  Right Kidney:   6.3 cm.  Moderate cortical thinning and increased echogenicity.  Small simple appearing lesions within are likely cysts.  Left Kidney:  7.2 cm.  Moderate renal cortical thinning and increased echogenicity.  Corresponding to the CT abnormality, in the interpolar left kidney, is a 2.6 x 2.6 x 2.8 cm lesion.  This is centrally anechoic with increased through transmission.  This has a heterogeneously thickened wall with a suggestion of vascularity on image 29 superiorly.  Bladder:  Within normal limits.  A left-sided pleural effusion is incidentally noted.  Left iliac fossa renal transplant demonstrates increased echogenicity with an interpolar simple appearing cyst.  There is cortical thinning of the renal transplant as well.  IMPRESSION:  1.  Indeterminate left renal mass.  This could represent a cyst complicated by prior hemorrhage/infection, or a cystic neoplasm. Potential clinical strategies would include surveillance with  CT, further characterization with MRI, or surgical resection. 2.  Findings of medical renal disease. 3.  Left-sided pleural effusion. 4.  Left iliac fossa renal transplant with transplant cortical thinning and increased echogenicity, suggesting transplant failure.   Original Report Authenticated By: Consuello Bossier, M.D.    Ir US Guide Vasc Access Right  05/31/2012  *RADIOLOGY REPORT*  Clinical  history:51 year old with known multiple medical problems including end-stage renal disease and malnutrition. Placement of a right upper extremity catheter was discussed with nephrology prior to the procedure.  PROCEDURE(S): RIGHT UPPER EXTREMITY VENOGRAM; PLACEMENT OF A RIGHT ARM MIDLINE CATHETER WITH ULTRASOUND AND FLUOROSCOPIC GUIDANCE  Physician: Rachelle Hora. Henn, MD  Medications:None  Moderate sedation time:None  Fluoroscopy time: 3.3 minutes  Contrast:  15 ml Omnipaque-300  Procedure:The procedure was explained to the patient.  The risks and benefits of the procedure were discussed and the patient's questions were addressed.  Informed consent was obtained from the patient.  The right upper arm was evaluated with ultrasound.  The patient had a patent right brachial vein.  The right upper arm was prepped and draped in a sterile fashion.  Maximal barrier sterile technique was utilized including caps, mask, sterile gowns, sterile gloves, sterile drape, hand hygiene and skin antiseptic.  Skin was anesthetized with lidocaine.  21 gauge needle was directed into the brachial vein with ultrasound guidance and a wire was advanced centrally.  The micropuncture dilator set was placed.  A right upper extremity venogram was performed.  A 5-French Kumpe catheter could not be easily advanced.  A 0.018 wire was manipulated into a larger vein within the  upper arm and a peel-away sheath was placed. A dual lumen PICC line was cut to 30 cm and advanced through the peel-away sheath.  Catheter tip was placed in the lateral right subclavian vein.  Follow-up venogram was obtained.  Catheter was sutured to the skin and flushed with normal saline.  Findings:Right upper extremity venogram demonstrates occlusion of the right innominate vein.  There are large collateral branches in the right hemithorax.  There also multiple collateral vessels in the right paraspinal region.  The catheter tip was placed in the lateral right subclavian vein.   Complications: None  Impression:Right upper extremity venogram demonstrates occlusion of the right innominate vein.  As a result, a right jugular or right subclavian central line could not be placed.  Placement of a right upper arm PICC line with the tip in the right subclavian vein.   Original Report Authenticated By: Richarda Overlie, M.D.    Ir Fluoro Guide Cv Midline Picc Right  05/31/2012  *RADIOLOGY REPORT*  Clinical history:51 year old with known multiple medical problems including end-stage renal disease and malnutrition. Placement of a right upper extremity catheter was discussed with nephrology prior to the procedure.  PROCEDURE(S): RIGHT UPPER EXTREMITY VENOGRAM; PLACEMENT OF A RIGHT ARM MIDLINE CATHETER WITH ULTRASOUND AND FLUOROSCOPIC GUIDANCE  Physician: Rachelle Hora. Henn, MD  Medications:None  Moderate sedation time:None  Fluoroscopy time: 3.3 minutes  Contrast:  15 ml Omnipaque-300  Procedure:The procedure was explained to the patient.  The risks and benefits of the procedure were discussed and the patient's questions were addressed.  Informed consent was obtained from the patient.  The right upper arm was evaluated with ultrasound.  The patient had a patent right brachial vein.  The right upper arm was prepped and draped in a sterile fashion.  Maximal barrier sterile technique was utilized including caps, mask, sterile gowns, sterile gloves, sterile drape, hand hygiene and skin antiseptic.  Skin was anesthetized with lidocaine.  21 gauge needle was directed into the brachial vein with ultrasound guidance and a wire was advanced centrally.  The micropuncture dilator set was placed.  A right upper extremity venogram was performed.  A 5-French Kumpe catheter could not be easily advanced.  A 0.018 wire was manipulated into a larger vein within the upper arm and a peel-away sheath was placed. A dual lumen PICC line was cut to 30 cm and advanced through the peel-away sheath.  Catheter tip was placed in the lateral  right subclavian vein.  Follow-up venogram was obtained.  Catheter was sutured to the skin and flushed with normal saline.  Findings:Right upper extremity venogram demonstrates occlusion of the right innominate vein.  There are large collateral branches in the right hemithorax.  There also multiple collateral vessels in the right paraspinal region.  The catheter tip was placed in the lateral right subclavian vein.  Complications: None  Impression:Right upper extremity venogram demonstrates occlusion of the right innominate vein.  As a result, a right jugular or right subclavian central line could not be placed.  Placement of a right upper arm PICC line with the tip in the right subclavian vein.   Original Report Authenticated By: Richarda Overlie, M.D.     Medications:    . antiseptic oral rinse  15 mL Mouth Rinse q12n4p  . feeding supplement (NEPRO CARB STEADY)  237 mL Oral TID BM  . feeding supplement  1 Container Oral Q24H  . fentaNYL  37.5 mcg Transdermal Q72H  . fluconazole (DIFLUCAN) IV  400 mg Intravenous Q T,Th,Sa-HD  .  heparin  40 Units/kg Dialysis Once in dialysis  . hydrocortisone sod succinate (SOLU-CORTEF) injection  20 mg Intravenous Daily  . insulin aspart  0-15 Units Subcutaneous Q8H  . labetalol  10 mg Intravenous Q6H  . lip balm   Topical BID  . pantoprazole (PROTONIX) IV  40 mg Intravenous Daily  . piperacillin-tazobactam (ZOSYN)  IV  2.25 g Intravenous Q8H  . simethicone  80 mg Oral QID  . 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 Exploratory laparotomy.  2. Lysis of adhesions.  3. Small bowel resection with side-to-side stapled anastomosis.  4. Omental patch of small bowel anastomosis.  ESRD on HD  ? Left renal lesion.  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  SBO (small bowel obstruction) s/p EL/LOA and SBR x 2   .  CKD (chronic kidney disease) stage V requiring chronic dialysis   .  Thrombocytopenia   .  Gout   .  HTN (hypertension)   .  Chronic use of steroids   .  Malnutrition, calorie   .  Leg graft occlusion   .  Acute respiratory failure with hypoxia   .  Sinus tachycardia   .  Abscess of abdominal cavity   .  Wound dehiscence   .  Bleeding   .  Anemia    Plan:  TNA, percutaneous drain, Full liquids. Last CT 05/28/12 shows RLQ abscess resolved with drain in decompressed cavity.    LOS: 60 days    Aniel Hubble 06/01/2012

## 2012-06-01 NOTE — Procedures (Signed)
I was present at this dialysis session. I have reviewed the session itself and made appropriate changes.   Vinson Moselle, MD [employer] 06/01/2012, 12:40 PM

## 2012-06-01 NOTE — Progress Notes (Signed)
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 Allopurinol     REACTION: decreased platelets  . Aspirin     REACTION: unspecified    Patient Measurements: Height: 5\' 11"  (180.3 cm) Weight: 117 lb 1 oz (53.1 kg) IBW/kg (Calculated) : 75.3  Adjusted Body Weight:  Usual Weight:   Vital Signs: Temp: 97.7 F (36.5 C) (09/10 1134) Temp src: Oral (09/10 0530) BP: 123/70 mmHg (09/10 1300) Pulse Rate: 77  (09/10 1300) Intake/Output from previous day: 09/09 0701 - 09/10 0700 In: 1790.2 [P.O.:420; IV Piggyback:150; TPN:1210.2] Out: 74 [Drains:74] Intake/Output from this shift: Total I/O In: 125 [P.O.:120; Other:5] Out: 8 [Drains:8]  Labs:  [facility] 06/01/12 0430 05/31/12 0650  WBC 6.2 6.3  HGB 11.8* 12.7*  HCT 35.3* 38.3*  PLT 118* 129*  APTT -- --  INR -- --     Basename 06/01/12 0430 05/31/12 0650  NA 130* 133*  K 3.2* 3.1*  CL 92* 93*  CO2 22 22  GLUCOSE 92 88  BUN 73* 56*  CREATININE 6.77* 5.43*  LABCREA -- --  CREAT24HRUR -- --  CALCIUM 9.9 10.2  MG -- 1.6  PHOS 3.4 2.9  PROT -- 5.4*  ALBUMIN 1.6* 1.7*  AST -- 37  ALT -- 34  ALKPHOS -- 173*  BILITOT -- 1.5*  BILIDIR -- --  IBILI -- --  PREALBUMIN -- 24.7  TRIG -- 125  CHOLHDL -- --  CHOL -- 173   Estimated Creatinine Clearance: 9.6 ml/min (by C-G formula based on Cr of 6.77).    Basename 06/01/12 0801 06/01/12 0005 05/31/12 1651  GLUCAP 83 109* 113*   Insulin Requirements in the past 24 hours:  No SSI, 15 units in TPN  Current Nutrition:  Full liquids, eating 0-25% of recent meals.  Nepro TID and Resource daily are primarily refused.  Nutritional Goals:  1700-1950 kCal, 80-100 grams of protein per day   Assessment: Admitted 7/13 with abd pain, weight loss, SBO. S/p LOA, SBR  7/24 and repeat LOA, SBR with anasomotic leak repair 8/1, thrombectomy with AVG revision 7/25 and repeat thrombectomy 8/6. CT on  8/13 reveals enlarging abscess and EC fistula.   GI: Tolerating TPN. Pt's wt appears to be relatively stable. Gravity drain bag due to controlled SB. Now on Fulls with Nepro TID (mostly refused) & Resource qday (mostly refused)- tolerating PO but feels full quickly. Doesn't like to eat before HD.  (+)BMs, (+)gas. Simethicone. 9/8: Plan wean TPN as calorie intake allows- may be several weeks.  0-25% meals eaten. Endo: CBGs < 150 since rate decrease & minimal SSI. On solu-cortef 20 daily (equiv pred 5 daily PTA)  Lytes: Na 130 d/t overload (-3.7L after HD 9/5), K stable at 3.2 (4K bath). Hypercalcemia continues (corrected Ca 12, Ca x PO4 remains < 55, Ca bath). Mag 1.6, Phos 2.9- improved. Renal MD replacing lytes, on no binders  Renal: ESRD with HD typically TTS. HD is currently 4x/week (MTTS) while pt requiring TPN adding to volume issues - plan to try and cut back to T/T/S. Current placement HD access limiting mobility, may change with 1-2 day HD holiday in order to reposition. CSW trying to find LTAC HD bed that can accommodate HD 4x/wk if TPN needs to continue. On Aranesp qSat- now on hold with Hb 12.8.  Cards: BP variable. (HTN d/t volume, BP nml during HD). On scheduled labetolol, prn hydralazine  Hepatobil: LFTs WNL,  except alk phos/Tbili elevated. Prealbumin remains at goal. Trig WNL 128  Neuro: GCS 20, no c/o pain - on fentanyl patch, prn pain meds.  ID: Fluconazole + Zosyn for peritonitis/abd abscess -- Noted primary team plans to cont abx thru 9/16, then re-CT abd and consult with ID for further recs on abx duration. Afeb, WBC WNL. Abd abscess culture: candida tropicalis. RLQ drain to abscess- improved, but continues to drain- output feculent in nature.  Zosyn 8.1>>  Diflucan 8.16>> (s/p 14 d mycamine)  Heme/Onc: Hgb up to 12.5 on Aranesp q Saturday (on hold).  Best Practices: Mouthcare, PPI po   Plan:  1. Cont Clinimix 5/15 (no lytes) 45 ml/hr to try to increase PO drive per MD, this will  provide 54 g protein/d (68% of goal) and 874 avg kcal/d (50% of goal). MD/RD aware TPN not meeting kcal goals with Clinimix alone. Prealbumin wnl = 24.7 2. Electrolyte replacement per renal MD  3. Provide available trace elements, MVI and lipids @5cc /hr on MWF only d/t national backorder  4. F/up PO intake to assess future TPN needs   Marisue Humble, PharmD Clinical Pharmacist [facility]

## 2012-06-01 NOTE — Progress Notes (Signed)
Occupational Therapy Treatment Patient Details Name: MATHEAU ORONA MRN: [id number] DOB: [birth year] Today's Date: 06/01/2012 Time: 7564-3329 OT Time Calculation (min): 12 min  OT Assessment / Plan / Recommendation Comments on Treatment Session Pt upgraded to Level 2 theraband and added 1 exercise Bil witl Level 1 theraband    Follow Up Recommendations  LTACH    Barriers to Discharge       Equipment Recommendations  Defer to next venue    Recommendations for Other Services    Frequency Min 1X/week   Plan Discharge plan remains appropriate    Precautions / Restrictions Precautions Precautions: Fall Precaution Comments: No RLE hip flexion secondary to femoral catheter Restrictions Weight Bearing Restrictions: No         OT Goals Arm Goals Arm Goal: Theraband Exercises - Progress: Progressing toward goal  Visit Information  Last OT Received On: 06/01/12 Assistance Needed: +1    Subjective Data  Subjective: "I don't know, I guess we can try" (using the next resistance of theraband)      Cognition  Overall Cognitive Status: Appears within functional limits for tasks assessed/performed Arousal/Alertness: Awake/alert Orientation Level: Appears intact for tasks assessed Behavior During Session: [facility] for tasks performed       Exercises  Other Exercises Other Exercises: Checked how pt was doing with level 1 theraband and he is doing rather well so did have him try theraband level 2 for side bedrails. Definitely struggles more with this level, but good to give him a challenge since he is still only bed level activity. Had pt do level 2 theraband BilUEs for each of the following: 1) reach towards the sky, 2) reach toward your feet, 3) reach towards the window with your left UE, 4) reach towards the door with your right UE, 5) reach across and pull back. Had pt pay attention to how his abdomen was feeling due to abdominal wound. Informed him not to do any more today and see how  his abdomin feels, if it feels fine then he can do 2 rounds of full routine tomorrow., if not then just stick with one round. Also tied 2 pieces of level 1 theraband to headboard  of bed so pt could work on biceps and triceps. Pt returned demonstrated.      End of Session OT - End of Session Activity Tolerance: Patient tolerated treatment well Patient left: in bed       Evette Georges [phone] 06/01/2012, 12:06 PM

## 2012-06-01 NOTE — Progress Notes (Signed)
Made aware by Dr. Izola Price that PICC placed in IR yesterday cannot be used for TNA given that tip is not at cavo-atrial junction (secondary to stenosis).  She has called SNF to see if they will be able to use it - if not, IR will have to replace femoral vein catheter to tunneled catheter which will allow ambulation. Femoral catheter will need to be removed 06/02/12 for day of rest and the tunneled catheter placed Friday prior to discharge. Will check chart to determine plan or please call [phone] with update.

## 2012-06-01 NOTE — Progress Notes (Signed)
Hemodialysis-See flowsheet Syncopal episode during last few minutes of treatment. Pt rinsed back with 4 minutes remaining and given extra 250 cc bolus. BP now stable (146/86) and pt states he feels "better but drained. "  VS stable post treatment. Unable to meet goal d/t bp issues.

## 2012-06-02 ENCOUNTER — Encounter (HOSPITAL_COMMUNITY): Payer: Self-pay | Admitting: Radiology

## 2012-06-02 DIAGNOSIS — T8131XA Disruption of external operation (surgical) wound, not elsewhere classified, initial encounter: Secondary | ICD-10-CM

## 2012-06-02 LAB — GLUCOSE, CAPILLARY
Glucose-Capillary: 109 mg/dL — ABNORMAL HIGH (ref 70–99)
Glucose-Capillary: 121 mg/dL — ABNORMAL HIGH (ref 70–99)

## 2012-06-02 LAB — BASIC METABOLIC PANEL
CO2: 26 mEq/L (ref 19–32)
Calcium: 9.6 mg/dL (ref 8.4–10.5)
Chloride: 97 mEq/L (ref 96–112)
Sodium: 135 mEq/L (ref 135–145)

## 2012-06-02 LAB — CBC
MCH: 27.7 pg (ref 26.0–34.0)
Platelets: 128 10*3/uL — ABNORMAL LOW (ref 150–400)
RBC: 4.58 MIL/uL (ref 4.22–5.81)
WBC: 6.5 10*3/uL (ref 4.0–10.5)

## 2012-06-02 MED ORDER — METOPROLOL TARTRATE 12.5 MG HALF TABLET
12.5000 mg | ORAL_TABLET | Freq: Two times a day (BID) | ORAL | Status: DC
  Administered 2012-06-02 – 2012-06-04 (×4): 12.5 mg via ORAL
  Filled 2012-06-02 (×8): qty 1

## 2012-06-02 MED ORDER — PREDNISONE 5 MG PO TABS
5.0000 mg | ORAL_TABLET | Freq: Every day | ORAL | Status: DC
  Administered 2012-06-04 – 2012-06-05 (×2): 5 mg via ORAL
  Filled 2012-06-02 (×4): qty 1

## 2012-06-02 NOTE — Progress Notes (Signed)
Patient was able to lay in bed in a 90 degree sitting position for 1 hour starting at 1400 during lunch. Patient did not complain of any light headedness or dizziness. Patient stated he would be able to maintain the sitting position for longer.

## 2012-06-02 NOTE — H&P (Signed)
Agree 

## 2012-06-02 NOTE — Progress Notes (Signed)
Patient ID: [name], male   DOB: 28-Jul-1961, 51 y.o.   MRN: [id number] [facility] PROGRESS NOTE  STEPHANO ARRANTS HKV:[id number] DOB: [birth year] DOA: 04/02/2012 PCP: Trevor Iha, MD  Brief narrative:  51 year old male with past medical history of ESRD (MWF), s/p renal transplant about 17 years ago, prostatectomy in 2012, admitted 04/02/2012 with abdominal pain, nausea and vomiting. Work up done indicated SBO and pt has required Ex Lap for lysis of adhesions. He subsequently had an anastomotic leak with an abscess which has necessitated an omental patching of the anastomosis and percutaneous drain placement. The patient had an elevation of his white blood cell count and CT of the abdomen revealed an enlarging abscess an enteric fistula was obstructed drainage tube necessitated repositioning of the percutaneous drain a percutaneous drain. Presently the patient has an open abdominal wound which is connected with the abscess cavity and is being managed by general surgery and wound ostomy care. Repeat CT abdomen as noted below indicated resolution of abscess and per IR attempt was made 05/31/2012 to try place central line from subclavian or jugular vein but right upper extremity venogram demonstrated occlusion of right innominate vein. Therefore, a right arm midline catheter was placed with no immediate complication. This was needed in order to allow pt more mobility in order to proceed with discharge planning. However,this line is not adequate for feeding as it had to be left short given veins occlusions. Plan is to have IR remove current femoral access and replace it with tunneled cath in the groin area as well and use that as feeding access.   Principal Problem:  *SBO (small bowel obstruction)  - patient is s/p exploratory laparotomy and lysis of adhesions and partial small bowel x 2 on 7/24.  - Patient has had subsequent anastomotic leak  - omental patch of anastomosis on 8/2.  - On 8/8,  CT abdomen and pelvis demonstrated several fluid collections consistent with abscess, largest measuring 16.5 x 13.7 x 6.4 cm.  - Patient is status post percutaneous drainage of largest fluid collection 04/30/2012.  - Repeat CT done for increasing WBC count on 8/13 revealed enlarging abscess and an enteric fistula with obstructed drainage tube.  - The drain was repositioned on 8/14. Since that time, he has had an open abdominal wound which connects to an abscess cavity, which has been slowly closing.  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  - Patient was not able to bend the right hip per interventional radiology but removing lines was planned in an attempt to give pt more moility  - please see above plan for feeding access - will assess in AM progress   Chronic  nonocclusive left lower extremity DVT.  - resolved  - Diagnosed via Doppler 04/30/2012 and follow up on 8/21 showed no evidence of DVT, superficial thrombosis or baker's cyst.   CKD (chronic kidney disease) stage V requiring chronic dialysis  - Dialysis per nephrology schedule  - back to [facility] times per week schedule   HTN (hypertension),  - BP  labile but relatively controlled  - Continue to monitor vitals per floor protocol   Anemia due to chronic disease  - Stable  - Has received a total of 4 units of packed red blood cells this admission  - Continue aranesp and ferric gluconate   GOUT  - Patient had been on pred 5mg  daily. Weaned IV hydrocortisone. Currently at 25 mg daily.  - Wean to 20mg  daily 9/2 (equivalent to pred 5) and will continue until tolerating PO   Leg graft occlusion  - s/p thrombectomy 7/25  - unfortunately re clotted and undergone an additional thrombectomy procedure with revision of arterial and graft on 04/27/2012.   Severe protein calorie malnutrition  - Patient is cachectic  - Parenteral nutrition managed by surgery and pharmacy.   Consultants:  General surgery  Nephrology  Infectious disease   Procedures:  7/24 Ex-lap, lysis of adhesions, partial small bowel resection x2  7/25 Thrombectomy L femoral AV graft  7/26 Extubated, CVL is out  7/27 Transferred to telemetry under [facility]  8/2 Acute abdomen, dehiscence of anastomosis, SBO, back from OR intubated in ICU on PCCM service. Found to have moderate peritonitis infraumbilically  8/2 Extubated   Diagnostic procedures:  US Renal  05/31/2012  IMPRESSION:  1. Indeterminate left renal mass. This could represent a cyst complicated by prior hemorrhage/infection, or a cystic neoplasm. Potential clinical strategies would include surveillance with CT, further characterization with MRI, or surgical resection.  2. Findings of medical renal disease.  3. Left-sided pleural effusion.  4. Left iliac fossa renal transplant with transplant cortical thinning and increased echogenicity, suggesting transplant failure.   Ir US Guide Vasc Access Right  05/31/2012  Impression:  Right upper extremity venogram demonstrates occlusion of the right innominate vein.  As a result, a right jugular or right subclavian central line could not be placed. Placement of a right upper  arm PICC line with the tip in the right subclavian vein.   CT abdomen and pelvis  05/29/2012  IMPRESSION:  1. The patient's right lower quadrant abscess is now essentially completely resolved. A drainage catheter remains in place within the abscess cavity which is completely decompressed.  2. There is there is a small filling defect associated with tip of the right femoral central venous catheter, which could represent catheter associated thrombus.  3. A 3.2 x 3.0 cm intermediate attenuation left renal lesion may simply represent a proteinaceous cyst.  4. Mild thickening and enhancement of the gallbladder wall. No evidence of gallstones. This finding is nonspecific, but not favored to represent acute cholecystitis.  5. Additional incidental findings, as above, similar to prior examinations.   Antibiotics:  Zosyn 8/1 >>> (tentative stop date of 9/16 with CT scan at that time)  Micafungin 8/3 >>8/16  Invanz 7/24 periop  Fluconazole- 8/2 + 8/17 >> (tentative stop date of 9/16 with CT scan at that time)  Vancomycin 8/8 >>> 05/01/2012   Code Status: Full  Family Communication: Pt at bedside over 60 minutes and sister over the phone  Disposition Plan: Likely SNF if they accept the current subclavicular line for feeding access, if they don't then  current femoral line will be removed in AM and will need to be replaced by tunneled cath in the groin area as well and that would likely be done 1-2 days after current femoral line removed, so likely plan for d/c is this Friday  HPI/Subjective: No events overnight.   Objective: Filed Vitals:   06/01/12 2150 06/02/12 0326 06/02/12 0511 06/02/12 0925  BP: 145/62 160/87 147/70 163/78  Pulse: 79 87 87 78  Temp: 98.6 F (37 C)  98.2 F (36.8 C) 98.2 F (36.8 C)  TempSrc: Oral  Oral   Resp: 16  17 15   Height:      Weight:      SpO2: 98%  96% 97%    Intake/Output Summary (Last 24 hours) at 06/02/12 1259 Last data filed at 06/02/12 1006  Gross  per 24 hour  Intake 1375.92 ml  Output   3383 ml  Net -2007.08 ml    Exam:   General:  Pt is alert, follows commands appropriately, not in acute distress, cachectic  Cardiovascular: Regular rate and rhythm, S1/S2, no murmurs, no rubs, no gallops  Respiratory: Clear to auscultation bilaterally, no wheezing, no crackles, no rhonchi  Abdomen: Soft, non tender, non distended, bowel sounds present, no guarding  Extremities: No edema, pulses DP and PT palpable bilaterally  Neuro: Grossly nonfocal  Data Reviewed: Basic Metabolic Panel:  Lab 06/02/12 4098 06/01/12 0430 05/31/12 0650 05/29/12 0953 05/28/12 0555 05/27/12 0649  NA 135 130* 133* 127* 132* --  K 3.5 3.2* 3.1* 3.3* 3.8 --  CL 97 92* 93* 90* 93* --  CO2 26 22 22  18* 26 --  GLUCOSE 99 92 88 86 104* --  BUN 39* 73* 56* 72* 41* --  CREATININE 4.59* 6.77* 5.43* 5.76* 4.16* --  CALCIUM 9.6 9.9 10.2 10.0 10.0 --  MG -- -- 1.6 -- -- 1.5  PHOS -- 3.4 2.9 3.1 -- 2.7   Liver Function Tests:  Lab 06/01/12 0430 05/31/12 0650 05/29/12 0953 05/27/12 0649  AST -- 37 -- 42*  ALT -- 34 -- 37  ALKPHOS -- 173* -- 170*  BILITOT -- 1.5* -- 1.6*  PROT -- 5.4* -- 5.6*  ALBUMIN 1.6* 1.7* 1.6* 1.6*1.7*   No results found for this basename: LIPASE:5,AMYLASE:5 in the last 168 hours No results found for this basename: AMMONIA:5 in the last 168 hours CBC:  Lab 06/02/12 0552 06/01/12 0430 05/31/12 0650 05/29/12 0953 05/28/12 0555  WBC 6.5 6.2 6.3 7.0 6.3  NEUTROABS -- -- 3.5 -- --  HGB 12.7* 11.8* 12.7* 12.5* 12.8*  HCT 37.4* 35.3* 38.3* 36.5* 38.8*  MCV 81.7 81.9 83.4 80.9 82.6  PLT 128* 118* 129* 134* 120*   Cardiac Enzymes: No results found for this basename: CKTOTAL:5,CKMB:5,CKMBINDEX:5,TROPONINI:5 in the last 168 hours BNP: No components found with this basename: POCBNP:5 CBG:  Lab 06/02/12 0733 06/02/12 0031 06/01/12 1638 06/01/12 0801 06/01/12 0005  GLUCAP 109* 121* 92 83 109*    Recent Results (from the past 240  hour(s))  MRSA PCR SCREENING     Status: Normal   Collection Time   05/31/12 10:18 AM      Component Value Range Status Comment   MRSA by PCR NEGATIVE  NEGATIVE Final      Scheduled Meds:    . antiseptic oral rinse  15 mL Mouth Rinse q12n4p  . feeding supplement (NEPRO CARB STEADY)  237 mL Oral TID BM  . feeding supplement  1 Container Oral Q24H  . fentaNYL  37.5  mcg Transdermal Q72H  . fluconazole (DIFLUCAN) IV  400 mg Intravenous Q T,Th,Sa-HD  . heparin  40 Units/kg Dialysis Once in dialysis  . insulin aspart  0-15 Units Subcutaneous Q8H  . lip balm   Topical BID  . metoprolol tartrate  12.5 mg Oral BID  . pantoprazole (PROTONIX) IV  40 mg Intravenous Daily  . piperacillin-tazobactam (ZOSYN)  IV  2.25 g Intravenous Q8H  . predniSONE  5 mg Oral Q breakfast  . simethicone  80 mg Oral QID  . sodium chloride  10-40 mL Intracatheter Q12H  . DISCONTD: hydrocortisone sod succinate (SOLU-CORTEF) injection  20 mg Intravenous Daily  . DISCONTD: labetalol  10 mg Intravenous Q6H   Continuous Infusions:    . TPN (CLINIMIX) +/- additives 45 mL/hr at 05/31/12 1744   And  . fat emulsion 240 mL (05/31/12 1745)  . TPN (CLINIMIX) +/- additives 45 mL/hr at 06/01/12 1719     Chaya Jan, MD  [facility] Pager [phone]  If 7PM-7AM, please contact night-coverage [email or web address] Password [email or web address] 06/02/2012, 12:59 PM   LOS: 61 days

## 2012-06-02 NOTE — H&P (Signed)
[name] is an 51 y.o. male.   Chief Complaint: ESRD; transfer to long term nursing care soon Has existing non tunneled rt femoral catheter placed by CCM - ?date Now need for tunneled Hickman for long term use and probable rehab and mobilization Need for meds and TPN. 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 Dialysis patient   . Prostate cancer   . Renal insufficiency     Past Surgical History  Procedure Date  . Laparotomy 04/14/2012    Procedure: EXPLORATORY LAPAROTOMY;  Surgeon: Clovis Pu. Cornett, MD;  Location: [facility] OR;  Service: General;  Laterality: N/A;  . Colon resection 04/14/2012  . Laparotomy 04/22/2012    Procedure: EXPLORATORY LAPAROTOMY;  Surgeon: Ardeth Sportsman, MD;  Location: [facility] OR;  Service: General;  Laterality: N/A;  lysis of adhesions, omentoplasty, repair small bowel  . Thrombectomy w/ embolectomy 04/27/2012    Procedure: THROMBECTOMY ARTERIOVENOUS GORE-TEX GRAFT;  Surgeon: Chuck Hint, MD;  Location: [facility] OR;  Service: Vascular;  Laterality: Left;  Thrombectomy of left thigh arteriovenous gortex graft    Family History  Problem Relation Age of Onset  . Hypertension Father   . Pneumonia Brother   . Lung disease Brother    Social History:  reports that he has never smoked. He does not have any smokeless tobacco history on file. He reports that he does not drink alcohol or use illicit drugs.  Allergies:  Allergies  Allergen Reactions  . Allopurinol     REACTION: decreased platelets  . Aspirin     REACTION: unspecified    Medications Prior to Admission  Medication Sig Dispense Refill  . colchicine 0.6 MG tablet Take 0.6 mg by mouth daily.      Marland Kitchen lanthanum (FOSRENOL) 1000 MG chewable tablet Chew 2,000 mg by mouth 3 (three) times daily with meals.      .  multivitamin (RENA-VIT) TABS tablet Take 1 tablet by mouth daily.      . predniSONE (DELTASONE) 5 MG tablet Take 5 mg by mouth daily.        Results for orders placed during the hospital encounter of 04/02/12 (from the past 48 hour(s))  GLUCOSE, CAPILLARY     Status: Abnormal   Collection Time   05/31/12  4:51 PM      Component Value Range Comment   Glucose-Capillary 113 (*) 70 - 99 mg/dL   GLUCOSE, CAPILLARY     Status: Abnormal   Collection Time   06/01/12 12:05 AM      Component Value Range Comment   Glucose-Capillary 109 (*) 70 - 99 mg/dL   CBC     Status: Abnormal   Collection Time   06/01/12  4:30 AM      Component Value Range Comment   WBC 6.2  4.0 - 10.5 K/uL    RBC 4.31  4.22 - 5.81 MIL/uL    Hemoglobin 11.8 (*) 13.0 - 17.0 g/dL    HCT 08.6 (*) 57.8 - 52.0 %    MCV 81.9  78.0 - 100.0 fL    MCH 27.4  26.0 - 34.0 pg    MCHC 33.4  30.0 - 36.0 g/dL    RDW 46.9 (*) 62.9 - 15.5 %    Platelets 118 (*) 150 - 400 K/uL PLATELET COUNT  CONFIRMED BY SMEAR  RENAL FUNCTION PANEL     Status: Abnormal   Collection Time   06/01/12  4:30 AM      Component Value Range Comment   Sodium 130 (*) 135 - 145 mEq/L    Potassium 3.2 (*) 3.5 - 5.1 mEq/L    Chloride 92 (*) 96 - 112 mEq/L    CO2 22  19 - 32 mEq/L    Glucose, Bld 92  70 - 99 mg/dL    BUN 73 (*) 6 - 23 mg/dL    Creatinine, Ser 1.61 (*) 0.50 - 1.35 mg/dL    Calcium 9.9  8.4 - 09.6 mg/dL    Phosphorus 3.4  2.3 - 4.6 mg/dL    Albumin 1.6 (*) 3.5 - 5.2 g/dL    GFR calc non Af Amer 8 (*) >90 mL/min    GFR calc Af Amer 10 (*) >90 mL/min   GLUCOSE, CAPILLARY     Status: Normal   Collection Time   06/01/12  8:01 AM      Component Value Range Comment   Glucose-Capillary 83  70 - 99 mg/dL   GLUCOSE, CAPILLARY     Status: Normal   Collection Time   06/01/12  4:38 PM      Component Value Range Comment   Glucose-Capillary 92  70 - 99 mg/dL   GLUCOSE, CAPILLARY     Status: Abnormal   Collection Time   06/02/12 12:31 AM      Component  Value Range Comment   Glucose-Capillary 121 (*) 70 - 99 mg/dL   CBC     Status: Abnormal   Collection Time   06/02/12  5:52 AM      Component Value Range Comment   WBC 6.5  4.0 - 10.5 K/uL    RBC 4.58  4.22 - 5.81 MIL/uL    Hemoglobin 12.7 (*) 13.0 - 17.0 g/dL    HCT 04.5 (*) 40.9 - 52.0 %    MCV 81.7  78.0 - 100.0 fL    MCH 27.7  26.0 - 34.0 pg    MCHC 34.0  30.0 - 36.0 g/dL    RDW 81.1 (*) 91.4 - 15.5 %    Platelets 128 (*) 150 - 400 K/uL   BASIC METABOLIC PANEL     Status: Abnormal   Collection Time   06/02/12  5:52 AM      Component Value Range Comment   Sodium 135  135 - 145 mEq/L    Potassium 3.5  3.5 - 5.1 mEq/L    Chloride 97  96 - 112 mEq/L    CO2 26  19 - 32 mEq/L    Glucose, Bld 99  70 - 99 mg/dL    BUN 39 (*) 6 - 23 mg/dL DELTA CHECK NOTED   Creatinine, Ser 4.59 (*) 0.50 - 1.35 mg/dL    Calcium 9.6  8.4 - 78.2 mg/dL    GFR calc non Af Amer 13 (*) >90 mL/min    GFR calc Af Amer 16 (*) >90 mL/min   GLUCOSE, CAPILLARY     Status: Abnormal   Collection Time   06/02/12  7:33 AM      Component Value Range Comment   Glucose-Capillary 109 (*) 70 - 99 mg/dL    Ir Veno/ext/uni Right  05/31/2012  *RADIOLOGY REPORT*  Clinical history:51 year old with known multiple medical problems including end-stage renal disease and malnutrition. Placement of a right upper extremity catheter was discussed with nephrology prior to the  procedure.  PROCEDURE(S): RIGHT UPPER EXTREMITY VENOGRAM; PLACEMENT OF A RIGHT ARM MIDLINE CATHETER WITH ULTRASOUND AND FLUOROSCOPIC GUIDANCE  Physician: Rachelle Hora. Henn, MD  Medications:None  Moderate sedation time:None  Fluoroscopy time: 3.3 minutes  Contrast:  15 ml Omnipaque-300  Procedure:The procedure was explained to the patient.  The risks and benefits of the procedure were discussed and the patient's questions were addressed.  Informed consent was obtained from the patient.  The right upper arm was evaluated with ultrasound.  The patient had a patent right  brachial vein.  The right upper arm was prepped and draped in a sterile fashion.  Maximal barrier sterile technique was utilized including caps, mask, sterile gowns, sterile gloves, sterile drape, hand hygiene and skin antiseptic.  Skin was anesthetized with lidocaine.  21 gauge needle was directed into the brachial vein with ultrasound guidance and a wire was advanced centrally.  The micropuncture dilator set was placed.  A right upper extremity venogram was performed.  A 5-French Kumpe catheter could not be easily advanced.  A 0.018 wire was manipulated into a larger vein within the upper arm and a peel-away sheath was placed. A dual lumen PICC line was cut to 30 cm and advanced through the peel-away sheath.  Catheter tip was placed in the lateral right subclavian vein.  Follow-up venogram was obtained.  Catheter was sutured to the skin and flushed with normal saline.  Findings:Right upper extremity venogram demonstrates occlusion of the right innominate vein.  There are large collateral branches in the right hemithorax.  There also multiple collateral vessels in the right paraspinal region.  The catheter tip was placed in the lateral right subclavian vein.  Complications: None  Impression:Right upper extremity venogram demonstrates occlusion of the right innominate vein.  As a result, a right jugular or right subclavian central line could not be placed.  Placement of a right upper arm PICC line with the tip in the right subclavian vein.   Original Report Authenticated By: Richarda Overlie, M.D.    Ir US Guide Vasc Access Right  05/31/2012  *RADIOLOGY REPORT*  Clinical history:51 year old with known multiple medical problems including end-stage renal disease and malnutrition. Placement of a right upper extremity catheter was discussed with nephrology prior to the procedure.  PROCEDURE(S): RIGHT UPPER EXTREMITY VENOGRAM; PLACEMENT OF A RIGHT ARM MIDLINE CATHETER WITH ULTRASOUND AND FLUOROSCOPIC GUIDANCE  Physician: Rachelle Hora. Henn, MD  Medications:None  Moderate sedation time:None  Fluoroscopy time: 3.3 minutes  Contrast:  15 ml Omnipaque-300  Procedure:The procedure was explained to the patient.  The risks and benefits of the procedure were discussed and the patient's questions were addressed.  Informed consent was obtained from the patient.  The right upper arm was evaluated with ultrasound.  The patient had a patent right brachial vein.  The right upper arm was prepped and draped in a sterile fashion.  Maximal barrier sterile technique was utilized including caps, mask, sterile gowns, sterile gloves, sterile drape, hand hygiene and skin antiseptic.  Skin was anesthetized with lidocaine.  21 gauge needle was directed into the brachial vein with ultrasound guidance and a wire was advanced centrally.  The micropuncture dilator set was placed.  A right upper extremity venogram was performed.  A 5-French Kumpe catheter could not be easily advanced.  A 0.018 wire was manipulated into a larger vein within the upper arm and a peel-away sheath was placed. A dual lumen PICC line was cut to 30 cm and advanced through the peel-away sheath.  Catheter  tip was placed in the lateral right subclavian vein.  Follow-up venogram was obtained.  Catheter was sutured to the skin and flushed with normal saline.  Findings:Right upper extremity venogram demonstrates occlusion of the right innominate vein.  There are large collateral branches in the right hemithorax.  There also multiple collateral vessels in the right paraspinal region.  The catheter tip was placed in the lateral right subclavian vein.  Complications: None  Impression:Right upper extremity venogram demonstrates occlusion of the right innominate vein.  As a result, a right jugular or right subclavian central line could not be placed.  Placement of a right upper arm PICC line with the tip in the right subclavian vein.   Original Report Authenticated By: Richarda Overlie, M.D.    Ir Fluoro Guide Cv  Midline Picc Right  05/31/2012  *RADIOLOGY REPORT*  Clinical history:51 year old with known multiple medical problems including end-stage renal disease and malnutrition. Placement of a right upper extremity catheter was discussed with nephrology prior to the procedure.  PROCEDURE(S): RIGHT UPPER EXTREMITY VENOGRAM; PLACEMENT OF A RIGHT ARM MIDLINE CATHETER WITH ULTRASOUND AND FLUOROSCOPIC GUIDANCE  Physician: Rachelle Hora. Henn, MD  Medications:None  Moderate sedation time:None  Fluoroscopy time: 3.3 minutes  Contrast:  15 ml Omnipaque-300  Procedure:The procedure was explained to the patient.  The risks and benefits of the procedure were discussed and the patient's questions were addressed.  Informed consent was obtained from the patient.  The right upper arm was evaluated with ultrasound.  The patient had a patent right brachial vein.  The right upper arm was prepped and draped in a sterile fashion.  Maximal barrier sterile technique was utilized including caps, mask, sterile gowns, sterile gloves, sterile drape, hand hygiene and skin antiseptic.  Skin was anesthetized with lidocaine.  21 gauge needle was directed into the brachial vein with ultrasound guidance and a wire was advanced centrally.  The micropuncture dilator set was placed.  A right upper extremity venogram was performed.  A 5-French Kumpe catheter could not be easily advanced.  A 0.018 wire was manipulated into a larger vein within the upper arm and a peel-away sheath was placed. A dual lumen PICC line was cut to 30 cm and advanced through the peel-away sheath.  Catheter tip was placed in the lateral right subclavian vein.  Follow-up venogram was obtained.  Catheter was sutured to the skin and flushed with normal saline.  Findings:Right upper extremity venogram demonstrates occlusion of the right innominate vein.  There are large collateral branches in the right hemithorax.  There also multiple collateral vessels in the right paraspinal region.  The  catheter tip was placed in the lateral right subclavian vein.  Complications: None  Impression:Right upper extremity venogram demonstrates occlusion of the right innominate vein.  As a result, a right jugular or right subclavian central line could not be placed.  Placement of a right upper arm PICC line with the tip in the right subclavian vein.   Original Report Authenticated By: Richarda Overlie, M.D.     Review of Systems  Constitutional: Positive for weight loss. Negative for fever.  Respiratory: Negative for shortness of breath.   Cardiovascular: Negative for chest pain.  Gastrointestinal: Negative for nausea and vomiting.  Neurological: Positive for weakness. Negative for headaches.    Blood pressure 163/78, pulse 78, temperature 98.2 F (36.8 C), temperature source Oral, resp. rate 15, height 5\' 11"  (1.803 m), weight 106 lb 7.7 oz (48.3 kg), SpO2 97.00%. Physical Exam  Constitutional: He is oriented  to person, place, and time.  Cardiovascular: Normal rate and regular rhythm.   Respiratory: Effort normal and breath sounds normal.  GI: Soft. Bowel sounds are normal.  Musculoskeletal:       Existing non tunneled rt femoral catheter placed at some time by CCM per MD.  For removal today in prep for placement of tunneled Hickman catheter 9/12  Neurological: He is alert and oriented to person, place, and time.  Psychiatric: He has a normal mood and affect. His behavior is normal. Judgment and thought content normal.     Assessment/Plan Tunneled PICC placed yesterday cannot be used for TPN because had to be left short Secondary to occlusion of upper extremities Need Tunneled Hickman at Rt femoral vein for long term use; TPN and possible mobilization Pt scheduled for this procedure 9/12 am Has existing non tunneled cath in Rt fem now- being removed now per MD order. Aware of procedure benefits and risks and agreeable to proceed. Consent signed and in chart Continue Zosyn IV  Champ Keetch  A 06/02/2012, 12:15 PM

## 2012-06-02 NOTE — Progress Notes (Signed)
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 Allopurinol     REACTION: decreased platelets  . Aspirin     REACTION: unspecified    Patient Measurements: Height: 5\' 11"  (180.3 cm) Weight: 106 lb 7.7 oz (48.3 kg) IBW/kg (Calculated) : 75.3  Adjusted Body Weight:  Usual Weight:   Vital Signs: Temp: 98.2 F (36.8 C) (09/11 0511) Temp src: Oral (09/11 0511) BP: 147/70 mmHg (09/11 0511) Pulse Rate: 87  (09/11 0511) Intake/Output from previous day: 09/10 0701 - 09/11 0700 In: 1495.9 [P.O.:120; IV Piggyback:150; TPN:1215.9] Out: 3384 [Drains:18] Intake/Output from this shift:    Labs:  [facility] 06/02/12 0552 06/01/12 0430 05/31/12 0650  WBC 6.5 6.2 6.3  HGB 12.7* 11.8* 12.7*  HCT 37.4* 35.3* 38.3*  PLT 128* 118* 129*  APTT -- -- --  INR -- -- --     Basename 06/02/12 0552 06/01/12 0430 05/31/12 0650  NA 135 130* 133*  K 3.5 3.2* 3.1*  CL 97 92* 93*  CO2 26 22 22   GLUCOSE 99 92 88  BUN 39* 73* 56*  CREATININE 4.59* 6.77* 5.43*  LABCREA -- -- --  CREAT24HRUR -- -- --  CALCIUM 9.6 9.9 10.2  MG -- -- 1.6  PHOS -- 3.4 2.9  PROT -- -- 5.4*  ALBUMIN -- 1.6* 1.7*  AST -- -- 37  ALT -- -- 34  ALKPHOS -- -- 173*  BILITOT -- -- 1.5*  BILIDIR -- -- --  IBILI -- -- --  PREALBUMIN -- -- 24.7  TRIG -- -- 125  CHOLHDL -- -- --  CHOL -- -- 173   Estimated Creatinine Clearance: 12.9 ml/min (by C-G formula based on Cr of 4.59).    Basename 06/02/12 0733 06/02/12 0031 06/01/12 1638  GLUCAP 109* 121* 92   Insulin Requirements in the past 24 hours:  2 units SSI, 15 units in TPN  Current Nutrition:  Full liquids, eating 0-25% of recent meals.  Nepro TID and Resource daily are primarily refused Clinimix 5/15 (no lytes) 45 ml/hr providing 54 g protein/d (68% of goal) and 874 avg kcal/d (50% of goal)  Nutritional Goals:  1700-1950 kCal, 80-100 grams of protein per day    Assessment: Admitted 7/13 with abd pain, weight loss, SBO. S/p LOA, SBR  7/24 and repeat LOA, SBR with anasomotic leak repair 8/1, thrombectomy with AVG revision 7/25 and repeat thrombectomy 8/6. 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   GI: Tolerating TPN. Pt's wt appears to be relatively stable. Gravity drain bag due to controlled SB. Now on Fulls with Nepro TID (mostly refused) & Resource qday (mostly refused)- tolerating PO but feels full quickly. Doesn't like to eat before HD.  (+)BMs, (+)gas. Simethicone. 9/8: Plan wean TPN as calorie intake allows- may be several weeks.  0-25% meals eaten. Endo: CBGs < 150 since rate decrease & minimal SSI. On solu-cortef 20 daily (equiv pred 5 daily PTA)  Lytes: Na 135 (-4L after HD 9/10), K stable at 3.5 (4K bath). Hypercalcemia continues (corrected Ca 11.5, Ca x PO4 remains < 55, Ca bath). Mag 1.6, Phos 3.4. Renal MD replacing lytes, on no binders  Renal: ESRD with HD back on TTS schedule. Current placement HD access limiting mobility, PICC inserted 9/9, cannot be used for TPN, CSW waiting on SNF to [place] lines before d/c. On Aranesp qSat- now on hold with Hb 12.7.  Cards: BP variable. (HTN d/t  volume, BP nml-low during HD). On scheduled labetolol, prn hydralazine  Hepatobil: LFTs WNL, except alk phos/Tbili elevated. Prealbumin remains at goal. Trig WNL 128  Neuro: GCS 20, no c/o pain - on fentanyl patch, prn pain meds.  ID: Fluconazole + Zosyn for peritonitis/abd abscess -- Noted primary team plans to cont abx thru 9/16, then re-CT abd and consult with ID for further recs on abx duration. Afeb, WBC WNL. Abd abscess culture: candida tropicalis. RLQ drain to abscess- improved, but continues to drain- output feculent in nature.  Zosyn 8.1>>  Diflucan 8.16>> (s/p 14 d mycamine)  Heme/Onc: Hgb up to 12.7 on Aranesp q Saturday (on hold), plt stable Best Practices: Mouthcare, PPI po, d/c to SNF in 1-2 days  Plan:  1.  TPN to be held today 2/2 removal  of femoral catheter 9/11 and placement of a tunneled catheter 9/12 per Sherrie George, PA and Delano Metz, MD 2.  Resume on 9/12: Clinimix 5/15 (no lytes) 45 ml/hr to try to increase PO drive per MD, this will provide 54 g protein/d (68% of goal) and 874 avg kcal/d (50% of goal). MD/RD aware TPN not meeting kcal goals with Clinimix alone. Prealbumin wnl = 24.7 3. Electrolyte replacement per renal MD  4. Provide available trace elements, MVI and lipids @5cc /hr on MWF only d/t national backorder  5. F/up PO intake to assess future TPN needs  6. CMET, Phos, Mag in AM  Thank you for the consult.  Tomi Bamberger, PharmD Clinical Pharmacist Pager: [phone] Pharmacy: [phone] 06/02/2012 11:37 AM

## 2012-06-02 NOTE — Progress Notes (Signed)
Patient ID: [name], male   DOB: [birth year], 51 y.o.   MRN: [id number] 36 Days Post-Op  Subjective: Picc line place yesterday, no real change.  No complaints. Plan is for a tunneling femoral picc line to be placed in am. We will ask them to wean off his TNA for today and restart it tomorrow once new picc line is in place.   Objective: Vital signs in last 24 hours: Temp:  [97 F (36.1 C)-98.6 F (37 C)] 98.2 F (36.8 C) (09/11 0925) Pulse Rate:  [73-92] 78  (09/11 0925) Resp:  [15-19] 15  (09/11 0925) BP: (89-163)/(44-87) 163/78 mmHg (09/11 0925) SpO2:  [96 %-100 %] 97 % (09/11 0925) Weight:  [106 lb 7.7 oz (48.3 kg)] 106 lb 7.7 oz (48.3 kg) (09/10 1603) Last BM Date: 05/30/12  7 ml recorded from drain yesterday, Diet: Full liquids, and TNA, afebrile, VSS. PICC placed Right upper arm this morning but it is a "short" PICC and not to be used for TNA.  Intake/Output from previous day: 09/10 0701 - 09/11 0700 In: 1495.9 [P.O.:120; IV Piggyback:150; TPN:1215.9] Out: 3384 [Drains:18] Intake/Output this shift: Total I/O In: 5 [Other:5] Out: 7 [Drains:7]  General appearance: A/A/O not as depressed today, interactive with his treatment asking appropriate questions concerning access issues. IR PA at bedside. Abdominal wound appears to be healing well top 1/3 almost completely closed, no drainage seen in wound exterior dressing is C/D/I.   Lab Results:   Basename 06/02/12 0552 06/01/12 0430  WBC 6.5 6.2  HGB 12.7* 11.8*  HCT 37.4* 35.3*  PLT 128* 118*    BMET  Basename 06/02/12 0552 06/01/12 0430  NA 135 130*  K 3.5 3.2*  CL 97 92*  CO2 26 22  GLUCOSE 99 92  BUN 39* 73*  CREATININE 4.59* 6.77*  CALCIUM 9.6 9.9   PT/INR No results found for this basename: LABPROT:2,INR:2 in the last 72 hours   Lab 06/01/12 0430 05/31/12 0650 05/29/12 0953 05/27/12 0649  AST -- 37 -- 42*  ALT -- 34 -- 37  ALKPHOS -- 173* -- 170*  BILITOT -- 1.5* -- 1.6*  PROT -- 5.4* -- 5.6*    ALBUMIN 1.6* 1.7* 1.6* 1.6*1.7*     Lipase  No results found for this basename: lipase     Studies/Results: Ir Veno/ext/uni Right  05/31/2012  *RADIOLOGY REPORT*  Clinical history:51 year old with known multiple medical problems including end-stage renal disease and malnutrition. Placement of a right upper extremity catheter was discussed with nephrology prior to the procedure.  PROCEDURE(S): RIGHT UPPER EXTREMITY VENOGRAM; PLACEMENT OF A RIGHT ARM MIDLINE CATHETER WITH ULTRASOUND AND FLUOROSCOPIC GUIDANCE  Physician: Rachelle Hora. Henn, MD  Medications:None  Moderate sedation time:None  Fluoroscopy time: 3.3 minutes  Contrast:  15 ml Omnipaque-300  Procedure:The procedure was explained to the patient.  The risks and benefits of the procedure were discussed and the patient's questions were addressed.  Informed consent was obtained from the patient.  The right upper arm was evaluated with ultrasound.  The patient had a patent right brachial vein.  The right upper arm was prepped and draped in a sterile fashion.  Maximal barrier sterile technique was utilized including caps, mask, sterile gowns, sterile gloves, sterile drape, hand hygiene and skin antiseptic.  Skin was anesthetized with lidocaine.  21 gauge needle was directed into the brachial vein with ultrasound guidance and a wire was advanced centrally.  The micropuncture dilator set was placed.  A right upper extremity venogram was performed.  A 5-French Kumpe catheter could not be easily advanced.  A 0.018 wire was manipulated into a larger vein within the upper arm and a peel-away sheath was placed. A dual lumen PICC line was cut to 30 cm and advanced through the peel-away sheath.  Catheter tip was placed in the lateral right subclavian vein.  Follow-up venogram was obtained.  Catheter was sutured to the skin and flushed with normal saline.  Findings:Right upper extremity venogram demonstrates occlusion of the right innominate vein.  There are large  collateral branches in the right hemithorax.  There also multiple collateral vessels in the right paraspinal region.  The catheter tip was placed in the lateral right subclavian vein.  Complications: None  Impression:Right upper extremity venogram demonstrates occlusion of the right innominate vein.  As a result, a right jugular or right subclavian central line could not be placed.  Placement of a right upper arm PICC line with the tip in the right subclavian vein.   Original Report Authenticated By: Richarda Overlie, M.D.    Ir US Guide Vasc Access Right  05/31/2012  *RADIOLOGY REPORT*  Clinical history:51 year old with known multiple medical problems including end-stage renal disease and malnutrition. Placement of a right upper extremity catheter was discussed with nephrology prior to the procedure.  PROCEDURE(S): RIGHT UPPER EXTREMITY VENOGRAM; PLACEMENT OF A RIGHT ARM MIDLINE CATHETER WITH ULTRASOUND AND FLUOROSCOPIC GUIDANCE  Physician: Rachelle Hora. Henn, MD  Medications:None  Moderate sedation time:None  Fluoroscopy time: 3.3 minutes  Contrast:  15 ml Omnipaque-300  Procedure:The procedure was explained to the patient.  The risks and benefits of the procedure were discussed and the patient's questions were addressed.  Informed consent was obtained from the patient.  The right upper arm was evaluated with ultrasound.  The patient had a patent right brachial vein.  The right upper arm was prepped and draped in a sterile fashion.  Maximal barrier sterile technique was utilized including caps, mask, sterile gowns, sterile gloves, sterile drape, hand hygiene and skin antiseptic.  Skin was anesthetized with lidocaine.  21 gauge needle was directed into the brachial vein with ultrasound guidance and a wire was advanced centrally.  The micropuncture dilator set was placed.  A right upper extremity venogram was performed.  A 5-French Kumpe catheter could not be easily advanced.  A 0.018 wire was manipulated into a larger vein  within the upper arm and a peel-away sheath was placed. A dual lumen PICC line was cut to 30 cm and advanced through the peel-away sheath.  Catheter tip was placed in the lateral right subclavian vein.  Follow-up venogram was obtained.  Catheter was sutured to the skin and flushed with normal saline.  Findings:Right upper extremity venogram demonstrates occlusion of the right innominate vein.  There are large collateral branches in the right hemithorax.  There also multiple collateral vessels in the right paraspinal region.  The catheter tip was placed in the lateral right subclavian vein.  Complications: None  Impression:Right upper extremity venogram demonstrates occlusion of the right innominate vein.  As a result, a right jugular or right subclavian central line could not be placed.  Placement of a right upper arm PICC line with the tip in the right subclavian vein.   Original Report Authenticated By: Richarda Overlie, M.D.    Ir Fluoro Guide Cv Midline Picc Right  05/31/2012  *RADIOLOGY REPORT*  Clinical history:51 year old with known multiple medical problems including end-stage renal disease and malnutrition. Placement of a right upper extremity catheter was discussed with  nephrology prior to the procedure.  PROCEDURE(S): RIGHT UPPER EXTREMITY VENOGRAM; PLACEMENT OF A RIGHT ARM MIDLINE CATHETER WITH ULTRASOUND AND FLUOROSCOPIC GUIDANCE  Physician: Rachelle Hora. Henn, MD  Medications:None  Moderate sedation time:None  Fluoroscopy time: 3.3 minutes  Contrast:  15 ml Omnipaque-300  Procedure:The procedure was explained to the patient.  The risks and benefits of the procedure were discussed and the patient's questions were addressed.  Informed consent was obtained from the patient.  The right upper arm was evaluated with ultrasound.  The patient had a patent right brachial vein.  The right upper arm was prepped and draped in a sterile fashion.  Maximal barrier sterile technique was utilized including caps, mask, sterile  gowns, sterile gloves, sterile drape, hand hygiene and skin antiseptic.  Skin was anesthetized with lidocaine.  21 gauge needle was directed into the brachial vein with ultrasound guidance and a wire was advanced centrally.  The micropuncture dilator set was placed.  A right upper extremity venogram was performed.  A 5-French Kumpe catheter could not be easily advanced.  A 0.018 wire was manipulated into a larger vein within the upper arm and a peel-away sheath was placed. A dual lumen PICC line was cut to 30 cm and advanced through the peel-away sheath.  Catheter tip was placed in the lateral right subclavian vein.  Follow-up venogram was obtained.  Catheter was sutured to the skin and flushed with normal saline.  Findings:Right upper extremity venogram demonstrates occlusion of the right innominate vein.  There are large collateral branches in the right hemithorax.  There also multiple collateral vessels in the right paraspinal region.  The catheter tip was placed in the lateral right subclavian vein.  Complications: None  Impression:Right upper extremity venogram demonstrates occlusion of the right innominate vein.  As a result, a right jugular or right subclavian central line could not be placed.  Placement of a right upper arm PICC line with the tip in the right subclavian vein.   Original Report Authenticated By: Richarda Overlie, M.D.     Medications:    . antiseptic oral rinse  15 mL Mouth Rinse q12n4p  . feeding supplement (NEPRO CARB STEADY)  237 mL Oral TID BM  . feeding supplement  1 Container Oral Q24H  . fentaNYL  37.5 mcg Transdermal Q72H  . fluconazole (DIFLUCAN) IV  400 mg Intravenous Q T,Th,Sa-HD  . heparin  40 Units/kg Dialysis Once in dialysis  . insulin aspart  0-15 Units Subcutaneous Q8H  . lip balm   Topical BID  . metoprolol tartrate  12.5 mg Oral BID  . pantoprazole (PROTONIX) IV  40 mg Intravenous Daily  . piperacillin-tazobactam (ZOSYN)  IV  2.25 g Intravenous Q8H  . predniSONE   5 mg Oral Q breakfast  . simethicone  80 mg Oral QID  . sodium chloride  10-40 mL Intracatheter Q12H  . DISCONTD: hydrocortisone sod succinate (SOLU-CORTEF) injection  20 mg Intravenous Daily  . 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 Exploratory laparotomy.  2. Lysis of adhesions.  3. Small bowel resection with side-to-side stapled anastomosis.  4. Omental patch of small bowel anastomosis.  ESRD on HD  ? Left renal lesion.  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  SBO (small bowel obstruction) s/p EL/LOA and SBR x 2   .  CKD (chronic kidney disease) stage V requiring chronic dialysis   .  Thrombocytopenia   .  Gout   .  HTN (hypertension)   .  Chronic use of steroids   .  Malnutrition, calorie   .  Leg graft occlusion   .  Acute respiratory failure with hypoxia   .  Sinus tachycardia   .  Abscess of abdominal cavity   .  Wound dehiscence   .  Bleeding   .  Anemia    Plan: Hold  TNA today resume in am after new PICC line is placed by IR. Full liquids.  Last CT 05/28/12 shows RLQ abscess resolved with drain in decompressed cavity. Continue supportive care    LOS: 61 days    Shaneese Tait 06/02/2012

## 2012-06-02 NOTE — Progress Notes (Signed)
[facility] Progress Note  Subjective:  NPO for procedure today. He thinks they are going to remove a groin catheter. Felt bad at the end of HD due to BP drop and said system clotted (no heparin)  Objective Filed Vitals:   06/01/12 2150 06/02/12 0326 06/02/12 0511 06/02/12 0925  BP: 145/62 160/87 147/70 163/78  Pulse: 79 87 87 78  Temp: 98.6 F (37 C)  98.2 F (36.8 C) 98.2 F (36.8 C)  TempSrc: Oral  Oral   Resp: 16  17 15   Height:      Weight:      SpO2: 98%  96% 97%   Physical Exam General: Reading newspaper, NAD Heart: RRR Lungs: no wheezes or rales Abdomen: soft, dressing midline Extremities: no LE edema; right groin triple lumen and drain Dialysis Access: left thigh AVGG  Assessment/Plan: 1. 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 When will he be able to have some heparin with his dialysis treatments once groin catheter is replaced? Can he start taking PO BP meds?? 2. ESRD - HD on TTS @ [facility], no Hep HD per surg request; using 4 K bath; no K restriction in diet at present - K 3.5 3. Anemia - Hgb 11.8 to 12.7 post HD  holding aranesp / no iron. 4. 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 HTN/volume - BP most recently 167/88, IV labetalol 10 mg q 6 hours; volume stable now with hd 3x per wk. ? Change to po meds.  Post wt 48.3 with net UF of only 3.3 which is not much considering his last HD treatment had been Saturday. Does not appear volume overloaded.  May need additional BP meds. Will start po metoprolol and d/c IV BP meds 6. Malnutrition - on Full liquids and TNA, Alb 1.6, but prealbumin 24.7 9/02). Midline PICC placed 9/9 in R arm by Dr. 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 Dr. Arlean Hopping  discussed with director of IV team regarding whether or not we can use this line for TNA, which is why it was put in and Dr. Verta Ellen . It appears femoral catheter will be removed today and then changed to a tunneled femoral catheter that would permit ambulation 7. Left renal mass - follow conservatively for now with serial CTs 8. Gout - stable, on IV Solu-Cortef - ? Transition to po prednisone. Change to po prednisone, taper over a week or two.  9. Thrombocytopenia - platelets trending back up 10. Disp - planning for d/c to SNF Friday.  Sheffield Slider, PA-C [facility] Beeper [phone]  06/02/2012,10:01 AM  LOS: 61 days   Patient seen and examined and agree with assessment and plan as above. Discussed with Dr. Ardyth Harps. Plan is to remove femoral cath today and then IR to place a tunneled non-HD cath in R femoral vein in a day or two.  Would keep chest line in for access if needed while TNA is off. Thanks Vinson Moselle  MD [facility] [phone] pgr    [phone] cell 06/02/2012, 10:58 AM   Additional Objective Labs: Basic Metabolic Panel:  Lab 06/02/12 2130 06/01/12 0430 05/31/12 0650 05/29/12 0953  NA 135 130* 133* --  K 3.5 3.2* 3.1* --  CL 97 92* 93* --  CO2 26 22 22  --  GLUCOSE 99 92 88 --  BUN 39* 73* 56* --  CREATININE 4.59* 6.77* 5.43* --  CALCIUM 9.6 9.9 10.2 --  ALB -- -- -- --  PHOS -- 3.4 2.9 3.1   Liver Function Tests:  Lab 06/01/12 0430 05/31/12 0650 05/29/12 0953 05/27/12 0649  AST -- 37 -- 42*  ALT -- 34 -- 37  ALKPHOS -- 173* -- 170*  BILITOT -- 1.5* -- 1.6*  PROT -- 5.4* -- 5.6*  ALBUMIN 1.6* 1.7* 1.6* --   CBC:  Lab 06/02/12 0552 06/01/12 0430 05/31/12 0650 05/29/12 0953 05/28/12 0555  WBC 6.5 6.2 6.3 -- --  NEUTROABS -- -- 3.5 -- --  HGB 12.7* 11.8* 12.7* -- --  HCT 37.4* 35.3* 38.3* -- --  MCV 81.7 81.9 83.4 80.9 82.6  PLT 128* 118* 129* -- --  CBG:  Lab 06/02/12 0733 06/02/12 0031 06/01/12 1638 06/01/12 0801  06/01/12 0005  GLUCAP 109* 121* 92 83 109*   IrMedications:    . TPN (CLINIMIX) +/- additives 45 mL/hr at 05/31/12 1744   And  . fat emulsion 240 mL (05/31/12 1745)  . TPN (CLINIMIX) +/- additives 45 mL/hr at 06/01/12 1719      . antiseptic oral rinse  15 mL Mouth Rinse q12n4p  . feeding supplement (NEPRO CARB STEADY)  237 mL Oral TID BM  . feeding supplement  1 Container Oral Q24H  . fentaNYL  37.5 mcg Transdermal Q72H  . fluconazole (DIFLUCAN) IV  400 mg Intravenous Q T,Th,Sa-HD  . heparin  40 Units/kg Dialysis Once in dialysis  . hydrocortisone sod succinate (SOLU-CORTEF) injection  20 mg Intravenous Daily  . insulin aspart  0-15 Units Subcutaneous Q8H  . labetalol  10 mg Intravenous Q6H  . lip balm   Topical BID  . pantoprazole (PROTONIX) IV  40 mg Intravenous Daily  . piperacillin-tazobactam (ZOSYN)  IV  2.25 g Intravenous Q8H  . simethicone  80 mg Oral QID  . sodium chloride  10-40 mL Intracatheter Q12H

## 2012-06-03 ENCOUNTER — Inpatient Hospital Stay (HOSPITAL_COMMUNITY): Payer: Medicare Other

## 2012-06-03 ENCOUNTER — Other Ambulatory Visit (HOSPITAL_COMMUNITY): Payer: Medicare Other

## 2012-06-03 LAB — RENAL FUNCTION PANEL
Albumin: 1.8 g/dL — ABNORMAL LOW (ref 3.5–5.2)
BUN: 56 mg/dL — ABNORMAL HIGH (ref 6–23)
Creatinine, Ser: 6.49 mg/dL — ABNORMAL HIGH (ref 0.50–1.35)
Glucose, Bld: 72 mg/dL (ref 70–99)
Phosphorus: 4.2 mg/dL (ref 2.3–4.6)

## 2012-06-03 LAB — GLUCOSE, CAPILLARY
Glucose-Capillary: 80 mg/dL (ref 70–99)
Glucose-Capillary: 60 mg/dL — ABNORMAL LOW (ref 70–99)
Glucose-Capillary: 89 mg/dL (ref 70–99)

## 2012-06-03 LAB — APTT: aPTT: 32 seconds (ref 24–37)

## 2012-06-03 LAB — CBC
HCT: 38.2 % — ABNORMAL LOW (ref 39.0–52.0)
MCH: 27.4 pg (ref 26.0–34.0)
MCV: 81.8 fL (ref 78.0–100.0)
Platelets: 134 10*3/uL — ABNORMAL LOW (ref 150–400)
RBC: 4.67 MIL/uL (ref 4.22–5.81)
RDW: 18 % — ABNORMAL HIGH (ref 11.5–15.5)

## 2012-06-03 LAB — PROTIME-INR: INR: 1.18 (ref 0.00–1.49)

## 2012-06-03 MED ORDER — FLUCONAZOLE 200 MG PO TABS
400.0000 mg | ORAL_TABLET | ORAL | Status: DC
  Filled 2012-06-03: qty 2

## 2012-06-03 MED ORDER — HEPARIN SODIUM (PORCINE) 1000 UNIT/ML DIALYSIS
20.0000 [IU]/kg | INTRAMUSCULAR | Status: DC | PRN
  Filled 2012-06-03: qty 1

## 2012-06-03 MED ORDER — MORPHINE SULFATE 2 MG/ML IJ SOLN
INTRAMUSCULAR | Status: AC
  Administered 2012-06-03: 2 mg via INTRAVENOUS
  Filled 2012-06-03: qty 1

## 2012-06-03 MED ORDER — PANTOPRAZOLE SODIUM 40 MG PO TBEC
40.0000 mg | DELAYED_RELEASE_TABLET | Freq: Every day | ORAL | Status: DC
  Administered 2012-06-04 – 2012-06-05 (×2): 40 mg via ORAL
  Filled 2012-06-03 (×2): qty 1

## 2012-06-03 NOTE — Progress Notes (Signed)
Hypoglycemic Event  CBG: 60  Treatment: 15 GM carbohydrate snack  Symptoms: None  Follow-up CBG: Time:1700 CBG Result:89  Possible Reasons for Event: Inadequate meal intake  Comments/MD notified:Hernandez Robley Fries, Mee Hives  Remember to initiate Hypoglycemia Order Set & complete

## 2012-06-03 NOTE — Progress Notes (Addendum)
37 Days Post-Op  Subjective: No complaints, no abdominal discomfort.  Objective: Vital signs in last 24 hours: Temp:  [97.9 F (36.6 C)-98.4 F (36.9 C)] 98 F (36.7 C) (09/12 0508) Pulse Rate:  [72-86] 80  (09/12 0508) Resp:  [15-17] 16  (09/12 0508) BP: (147-164)/(69-83) 149/69 mmHg (09/12 0508) SpO2:  [93 %-100 %] 93 % (09/12 0508) Weight:  [51.166 kg (112 lb 12.8 oz)] 51.166 kg (112 lb 12.8 oz) (09/11 2044) Last BM Date: 06/02/12  +BM, 17ml from drain, NPO for procedure, he was on full liquids, afebrile, VSS, Labs stab;e  Intake/Output from previous day: 09/11 0701 - 09/12 0700 In: 664.1 [TPN:659.1] Out: 17 [Drains:17] Intake/Output this shift:    General appearance: alert, cooperative and no distress GI: soft, non-tender; bowel sounds normal; no masses,  no organomegaly and open portion of wound healing nicely.  Drain site still showing black-green colored fluid.  Bag is currently empty.  Lab Results:   Basename 06/03/12 0600 06/02/12 0552  WBC 6.9 6.5  HGB 12.8* 12.7*  HCT 38.2* 37.4*  PLT 134* 128*    BMET  Basename 06/02/12 0552 06/01/12 0430  NA 135 130*  K 3.5 3.2*  CL 97 92*  CO2 26 22  GLUCOSE 99 92  BUN 39* 73*  CREATININE 4.59* 6.77*  CALCIUM 9.6 9.9   PT/INR  Basename 06/03/12 0600  LABPROT 15.3*  INR 1.18     Lab 06/01/12 0430 05/31/12 0650 05/29/12 0953  AST -- 37 --  ALT -- 34 --  ALKPHOS -- 173* --  BILITOT -- 1.5* --  PROT -- 5.4* --  ALBUMIN 1.6* 1.7* 1.6*     Lipase  No results found for this basename: lipase     Studies/Results: No results found.  Medications:    . antiseptic oral rinse  15 mL Mouth Rinse q12n4p  . feeding supplement (NEPRO CARB STEADY)  237 mL Oral TID BM  . feeding supplement  1 Container Oral Q24H  . fentaNYL  37.5 mcg Transdermal Q72H  . fluconazole (DIFLUCAN) IV  400 mg Intravenous Q T,Th,Sa-HD  . heparin  40 Units/kg Dialysis Once in dialysis  . insulin aspart  0-15 Units Subcutaneous Q8H    . lip balm   Topical BID  . metoprolol tartrate  12.5 mg Oral BID  . pantoprazole (PROTONIX) IV  40 mg Intravenous Daily  . predniSONE  5 mg Oral Q breakfast  . simethicone  80 mg Oral QID  . sodium chloride  10-40 mL Intracatheter Q12H  . DISCONTD: hydrocortisone sod succinate (SOLU-CORTEF) injection  20 mg Intravenous Daily  . DISCONTD: labetalol  10 mg Intravenous Q6H  . DISCONTD: piperacillin-tazobactam (ZOSYN)  IV  2.25 g Intravenous Q8H    Assessment/Plan; SBO Exploratory laparotomy with lysis of adhesions and small bowel resection x2, 04/14/2012, Cl[place]is Pu. 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 Exploratory laparotomy.  2. Lysis of adhesions.  3. Small bowel resection with side-to-side stapled anastomosis.  4. Omental patch of small bowel anastomosis. Ardeth Sportsman, MD, FACS.   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  SBO (small bowel obstruction)  s/p EL/LOA and SBR x 2   .  CKD (chronic kidney disease) stage V requiring chronic dialysis Hx of renal transplant  17 years ago  .  Thrombocytopenia   .  Gout   .  HTN (hypertension)   .  Chronic use of steroids   .  Malnutrition, calorie   .  Leg graft occlusion   .  Acute respiratory failure with hypoxia   .  Sinus tachycardia   .  Abscess of abdominal cavity   .  Wound dehiscence   .  Bleeding   .  Anemia     Plan:  For new IV access today.  Then plans are to send to SNF or LTAC.  Last CT of abd on 05/28/12 shows abscess RLQ decompressed.  Minimal drainage from the current drain, but still pretty dark/black-green color.Open  abdominal wound looks good. Will discuss when to get the next CT, and plan for drain after discharge. Dr. Luisa Hart did the original surgery here so he will do outpatient follow up. He was on a full liquid diet Monday, I will see if we can go to low fiber.  I can't tell if that was done already or not.     LOS: 62 days    [name] 06/03/2012

## 2012-06-03 NOTE — Progress Notes (Signed)
PHARMACIST - PHYSICIAN COMMUNICATION PA:   M. Short CONCERNING: Antibiotic IV to Oral Route Change Policy  RECOMMENDATION: This patient is receiving fluconazole by the intravenous route.  Based on criteria approved by the Pharmacy and Therapeutics Committee, the antibiotic(s) is/are being converted to the equivalent oral dose form(s).  Fluconazole bioavailability is 100%. Noted that patient's EC fistula output is minimal, pt is afebrile, and WBC are wnl. Also noted that pt is taking PO medications x 24 hours.   DESCRIPTION: These criteria include:  Patient being treated for a respiratory tract infection, urinary tract infection, or cellulitis  The patient is not neutropenic and does not exhibit a GI malabsorption state  The patient is eating (either orally or via tube) and/or has been taking other orally administered medications for a least 24 hours  The patient is improving clinically and has a Tmax < 100.5  If you have questions about this conversion, please contact the Pharmacy Department  []   [phone] )  [facility] [x]   [phone] )  [facility]  []   [phone] )  [facility] []   [phone] )  [facility]

## 2012-06-03 NOTE — Progress Notes (Signed)
Occupational Therapy Treatment Patient Details Name: [name] MRN: [id number] DOB: 1961-01-15 Today's Date: 06/03/2012 Time: 5284-1324 OT Time Calculation (min): 29 min  OT Assessment / Plan / Recommendation Comments on Treatment Session Pt up and OOB for the first time in about 2 months---did GREAT!    Follow Up Recommendations  Skilled nursing facility       Equipment Recommendations  Defer to next venue       Frequency Min 2X/week   Plan Discharge plan needs to be updated    Precautions / Restrictions Precautions Precautions: Fall Precaution Comments: triple lumen DC'd from right groin, awaiting femoral tunnel cath (permanent), abdominal wound, abdominal drain Restrictions Weight Bearing Restrictions: No        ADL  Lower Body Dressing: Performed;Set up Where Assessed - Lower Body Dressing: Supine, head of bed up Toilet Transfer: Simulated;Minimal assistance Toilet Transfer Method: Sit to stand Toilet Transfer Equipment:  (Bed to recliner on pt's right about 3 feet apart) Equipment Used: Gait belt (up high) Transfers/Ambulation Related to ADLs: Min A      OT Goals Acute Rehab OT Goals OT Goal Formulation: With patient Time For Goal Achievement: 06/17/12 Potential to Achieve Goals: Good ADL Goals Pt Will Perform Grooming: with set-up;with supervision;Standing at sink;Unsupported (2 tasks) ADL Goal: Grooming - Progress: Goal set today Pt Will Perform Upper Body Bathing: with set-up;with supervision;Unsupported;Sitting at sink;Standing at sink ADL Goal: Upper Body Bathing - Progress: Goal set today Pt Will Perform Lower Body Bathing: with set-up;with supervision;Standing at sink;Sitting at sink;Unsupported ADL Goal: Lower Body Bathing - Progress: Goal set today Pt Will Transfer to Toilet: with supervision;Ambulation;with DME;Comfort height toilet;Regular height toilet;Grab bars ADL Goal: Toilet Transfer - Progress: Goal set today Pt Will Perform Toileting -  Clothing Manipulation: Independently;Standing ADL Goal: Toileting - Clothing Manipulation - Progress: Goal set today Pt Will Perform Toileting - Hygiene: Independently;Sit to stand from 3-in-1/toilet ADL Goal: Toileting - Hygiene - Progress: Goal set today Miscellaneous OT Goals OT Goal: Miscellaneous Goal #1 - Progress: Discontinued (comment) (Pt beyond this task, N/A) OT Goal: Miscellaneous Goal #2 - Progress: Progressing toward goals  Visit Information  Last OT Received On: 06/03/12 Assistance Needed: +1 PT/OT Co-Evaluation/Treatment: Yes (Since this is the first time we actually got him up)    Subjective Data  Subjective: "That stuff is hard" ([place], Level 2 theraband), "Feels good to be up"   Prior Functioning  Home Living Lives With: Alone Available Help at Discharge: Skilled Nursing Facility Prior Function Level of Independence: Independent Vocation: On disability Communication Communication: No difficulties Dominant Hand: Right    Cognition  Overall Cognitive Status: Appears within functional limits for tasks assessed/performed Arousal/Alertness: Awake/alert Orientation Level: Appears intact for tasks assessed Behavior During Session: [facility] for tasks performed    Mobility  Shoulder Instructions Bed Mobility Bed Mobility: Supine to Sit;Sit to Supine Supine to Sit: 5: Supervision;HOB elevated;With rails Sitting - Scoot to Edge of Bed: 5: Supervision Sit to Supine: Not Tested (comment) Sit to Sidelying Right: Not Tested (comment) Details for Bed Mobility Assistance: pt. able to manage self to EOB with slight increased time needed. VC's for technique Transfers Sit to Stand: From bed;With upper extremity assist;1: +2 Total assist Sit to Stand: Patient Percentage: 80% Stand to Sit: 4: Min assist;With armrests;To chair/3-in-1 Details for Transfer Assistance: vc's for technique and hand placement     Exercises  General Exercises - Lower Extremity Ankle Circles/Pumps:  AROM;Both;15 reps;Seated   Balance Static Standing Balance Static Standing -  Balance Support: Bilateral upper extremity supported Static Standing - Level of Assistance: 4: Min assist   End of Session OT - End of Session Equipment Utilized During Treatment: Gait belt (Bil HHA) Activity Tolerance: Patient tolerated treatment well Patient left: in chair (to sit up 1 hour only) Nurse Communication: Mobility status (nurse tech)     Evette Georges [phone] 06/03/2012, 10:14 AM

## 2012-06-03 NOTE — Evaluation (Signed)
Physical Therapy RE- Evaluation Patient Details Name: [name] MRN: [id number] DOB: [birth year] Today's Date: 06/03/2012 Time: 4098-1191 PT Time Calculation (min): 25 min  PT Assessment / Plan / Recommendation Clinical Impression  Pt. today is re-evaluated now that triple lumen cath in right groin has been DC'd.  Pt. is to go down later today for insertion of tunnel cath, HD this pm then possible DC tomorrow to SNF.  Pt. is generally weak and with deconditioning from lengthy bed rest but did unexpectedly well today with therapies.  He does need ongoing therapies for achieving maximal independence and teturn to home enviornment. Will be best served at [facility] SNF for needed PT and for ongoing HD needs.  Will establish goals thought anticipate DC to SNF tomorrow.      PT Assessment  Patient needs continued PT services    Follow Up Recommendations  Skilled nursing facility;Supervision/Assistance - 24 hour    Barriers to Discharge Decreased caregiver support      Equipment Recommendations  Defer to next venue    Recommendations for Other Services     Frequency Min 2X/week    Precautions / Restrictions Precautions Precautions: Fall Precaution Comments:  (triple lumen DC'd from right thigh, awaiting tunnel cath ) Restrictions Weight Bearing Restrictions: No   Pertinent Vitals/Pain BPs high in seated and with standing (see vitals tab).  No symptoms of orthostasis with standing.  No dizziness, good tolerance to activity.      Mobility  Bed Mobility Bed Mobility: Supine to Sit;Sit to Supine Supine to Sit: 5: Supervision;HOB elevated;With rails Sitting - Scoot to Edge of Bed: 5: Supervision Sit to Supine: Not Tested (comment) Sit to Sidelying Right: Not Tested (comment) Details for Bed Mobility Assistance: pt. able to manage self to EOB with slight increased time needed. VC's for technique Transfers Transfers: Sit to Stand;Stand to Sit Sit to Stand: From bed;With upper extremity  assist;1: +2 Total assist Sit to Stand: Patient Percentage: 80% Stand to Sit: 4: Min assist;With armrests;To chair/3-in-1 Details for Transfer Assistance: vc's for technique and hand placement Ambulation/Gait Ambulation/Gait Assistance: 1: +2 Total assist Ambulation/Gait: Patient Percentage: 80% Ambulation Distance (Feet): 4 Feet Assistive device: 2 person hand held assist Ambulation/Gait Assistance Details: Pt. able to take 4 turning steps to recliner with no buckling of legs noted, though slight trembling due to weakness for sustained tasks. Gait Pattern: Step-to pattern;Decreased step length - right;Decreased step length - left Gait velocity: slowed Stairs: No Wheelchair Mobility Wheelchair Mobility: No    Exercises General Exercises - Lower Extremity Ankle Circles/Pumps: AROM;Both;15 reps;Seated   PT Diagnosis: Difficulty walking;Abnormality of gait;Generalized weakness  PT Problem List: Decreased strength;Decreased activity tolerance;Decreased balance;Decreased mobility;Decreased knowledge of use of DME;Cardiopulmonary status limiting activity PT Treatment Interventions: DME instruction;Gait training;Functional mobility training;Therapeutic activities;Therapeutic exercise;Balance training;Patient/family education   PT Goals Acute Rehab PT Goals PT Goal Formulation: With patient Time For Goal Achievement: 06/10/12 Potential to Achieve Goals: Good Pt will Roll Supine to Right Side: Independently PT Goal: Rolling Supine to Right Side - Progress: Goal set today Pt will Roll Supine to Left Side: Independently PT Goal: Rolling Supine to Left Side - Progress: Goal set today Pt will go Supine/Side to Sit: with modified independence PT Goal: Supine/Side to Sit - Progress: Goal set today Pt will go Sit to Supine/Side: with modified independence PT Goal: Sit to Supine/Side - Progress: Goal set today Pt will go Sit to Stand: with supervision PT Goal: Sit to Stand - Progress: Goal set  today Pt  will go Stand to Sit: with supervision PT Goal: Stand to Sit - Progress: Goal set today Pt will Transfer Bed to Chair/Chair to Bed: with supervision PT Transfer Goal: Bed to Chair/Chair to Bed - Progress: Goal set today Pt will Stand: with supervision PT Goal: Stand - Progress: Goal set today Pt will Ambulate: 16 - 50 feet;with rolling walker;with min assist Pt will Perform Home Exercise Program: Independently PT Goal: Perform Home Exercise Program - Progress: Goal set today  Visit Information  Last PT Received On: 06/03/12 Assistance Needed: +1 PT/OT Co-Evaluation/Treatment: Yes    Subjective Data  Subjective: "I really thought I could do it" regarding OOB this am Patient Stated Goal: progress activity   Prior Functioning  Home Living Lives With: Alone Available Help at Discharge: Skilled Nursing Facility Prior Function Level of Independence: Independent Vocation: On disability Communication Communication: No difficulties Dominant Hand: Right    Cognition  Overall Cognitive Status: Appears within functional limits for tasks assessed/performed Arousal/Alertness: Awake/alert Orientation Level: Appears intact for tasks assessed Behavior During Session: [facility] for tasks performed    Extremity/Trunk Assessment Right Upper Extremity Assessment RUE ROM/Strength/Tone: Within functional levels RUE ROM/Strength/Tone Deficits: decreased muscle mass, generally weak  RUE Sensation: WFL - Light Touch;WFL - Proprioception RUE Coordination: WFL - gross/fine motor Left Upper Extremity Assessment LUE ROM/Strength/Tone: Within functional levels LUE ROM/Strength/Tone Deficits: decreased muscle mass, generally weak  LUE Sensation: WFL - Light Touch;WFL - Proprioception LUE Coordination: WFL - gross/fine motor Right Lower Extremity Assessment RLE ROM/Strength/Tone: WFL for tasks assessed RLE ROM/Strength/Tone Deficits: generally weak, decrease muscle mass  RLE Sensation: WFL -  Light Touch;WFL - Proprioception RLE Coordination: WFL - gross/fine motor Left Lower Extremity Assessment LLE ROM/Strength/Tone: WFL for tasks assessed LLE ROM/Strength/Tone Deficits: generally weak, decrease muscle mass  LLE Sensation: WFL - Light Touch;WFL - Proprioception LLE Coordination: WFL - gross/fine motor Trunk Assessment Trunk Assessment: Kyphotic   Balance Static Standing Balance Static Standing - Balance Support: Bilateral upper extremity supported Static Standing - Level of Assistance: 4: Min assist  End of Session PT - End of Session Equipment Utilized During Treatment: Gait belt Activity Tolerance: Patient tolerated treatment well;Patient limited by fatigue Patient left: in chair;with call bell/phone within reach Nurse Communication: Mobility status;Other (comment) (need to limit sitting to 1 hour to avoid fatigue/skin issues)  GP     Ferman Hamming 06/03/2012, 8:54 AM Weldon Picking PT Acute Rehab Services [phone] Beeper [phone]

## 2012-06-03 NOTE — Progress Notes (Signed)
[facility] Progress Note  Subjective:  Planning on discharge to [facility].  Right tunneled fem cath to be placed Friday.  No c/o.  Feels like he is "going forward". Taking FL/grits.   Objective Filed Vitals:   06/03/12 0508 06/03/12 0810 06/03/12 0815 06/03/12 0820  BP: 149/69 176/91 178/95 167/90  Pulse: 80     Temp: 98 F (36.7 C)     TempSrc: Oral     Resp: 16     Height:      Weight:      SpO2: 93%      Physical Exam General: Out of bed in a chair! Comfortable. Heart: RRR Lungs: clear without wheezes or rales Abdomen: soft Extremities: no edema; significant muscle wasting Dialysis Access: left thigh graft + bruit; right groin drain still with dark brown drainage  Assessment/Plan: 1. 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 When will he be able to have some heparin with his dialysis treatments once groin catheter is replaced? Will resume heparin at low doses ("tight" x 2prn) with HD today. 2. ESRD - HD on TTS @ [facility],  K 4.3 today...has been in the 3s up to now 3. Anemia - Hgb 12.8 Aranesp on hold / no iron. 4. MBD/hypercalcemia - Ca 10.6 (12.3 corrected), phos up to 4.2 off binders, no Zemplar; can use 2Ca bath today be cause K is up and we can use a 2 K bath 5. HTN/volume - Does not appear volume overloaded. IV BP meds d/c'd Metoprolol started conservatively at 12.5 BID.  Will see how BP does with HD today and volume removal and titrate metoprolol accordingly. Goal EDW should probably be ~ 48 - 50 6. Malnutrition - on Full liquids and TNA, Alb 1.8,  prealbumin 24.7 9/9). Midline PICC placed 9/9 in R arm by Dr. 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 TNA on hold today due to lack of large vein access.  Catheter to be placed Friday as noted above. Watch for  hypoglycemia today. 7. Left renal mass - follow conservatively for now with serial CTs 8. Gout - stable, Changed to po prednisone 5/day, would defer taper to his primary nephrologist Dr. Darrick Penna.  9. Thrombocytopenia - platelets trending back up 10.  Disp - planning for d/c to SNF Friday.  Sheffield Slider, PA-C [facility] Beeper [phone]  06/03/2012,9:29 AM  LOS: 62 days   Patient seen and examined and agree with assessment and plan as above with additions as indicated.  Stephen Moselle  MD [facility] [phone] pgr    [phone] cell 06/03/2012, 11:08 AM        Additional Objective Labs: Basic Metabolic Panel:  Lab 06/03/12 2130 06/02/12 0552 06/01/12 0430 05/31/12 0650  NA 135 135 130* --  K 4.3 3.5 3.2* --  CL 95* 97 92* --  CO2 24 26 22  --  GLUCOSE 72 99 92 --  BUN 56* 39* 73* --  CREATININE 6.49* 4.59* 6.77* --  CALCIUM 10.6* 9.6 9.9 --  ALB -- -- -- --  PHOS 4.2 -- 3.4 2.9   Liver Function Tests:  Lab 06/03/12 0600 06/01/12 0430 05/31/12 0650  AST -- -- 37  ALT -- -- 34  ALKPHOS -- -- 173*  BILITOT -- -- 1.5*  PROT -- --  5.4*  ALBUMIN 1.8* 1.6* 1.7*   CBC:  Lab 06/03/12 0600 06/02/12 0552 06/01/12 0430 05/31/12 0650 05/29/12 0953  WBC 6.9 6.5 6.2 -- --  NEUTROABS -- -- -- 3.5 --  HGB 12.8* 12.7* 11.8* -- --  HCT 38.2* 37.4* 35.3* -- --  MCV 81.8 81.7 81.9 83.4 80.9  PLT 134* 128* 118* -- --  CBG:  Lab 06/03/12 0738 06/03/12 0057 06/02/12 1644 06/02/12 0733 06/02/12 0031  GLUCAP 80 80 129* 109* 121*  Medications:    . TPN (CLINIMIX) +/- additives 45 mL/hr at 06/01/12 1719      . antiseptic oral rinse  15 mL Mouth Rinse q12n4p  . feeding supplement (NEPRO CARB STEADY)  237 mL Oral TID BM  . feeding supplement  1 Container Oral Q24H  . fentaNYL  37.5 mcg Transdermal Q72H  . fluconazole (DIFLUCAN) IV  400 mg Intravenous Q T,Th,Sa-HD  . heparin  40 Units/kg Dialysis Once in dialysis  . insulin aspart  0-15 Units  Subcutaneous Q8H  . lip balm   Topical BID  . metoprolol tartrate  12.5 mg Oral BID  . pantoprazole (PROTONIX) IV  40 mg Intravenous Daily  . predniSONE  5 mg Oral Q breakfast  . simethicone  80 mg Oral QID  . sodium chloride  10-40 mL Intracatheter Q12H  . DISCONTD: hydrocortisone sod succinate (SOLU-CORTEF) injection  20 mg Intravenous Daily  . DISCONTD: labetalol  10 mg Intravenous Q6H  . DISCONTD: piperacillin-tazobactam (ZOSYN)  IV  2.25 g Intravenous Q8H

## 2012-06-03 NOTE — Progress Notes (Signed)
Patient ID: [name], male   DOB: 08-Oct-1960, 51 y.o.   MRN: [id number] [facility] PROGRESS NOTE  MAXFIELD GILDERSLEEVE WGN:[id number] DOB: 11-07-1960 DOA: 04/02/2012 PCP: Trevor Iha, MD  Brief narrative:  51 year old male with past medical history of ESRD (MWF), s/p renal transplant about 17 years ago, prostatectomy in 2012, admitted 04/02/2012 with abdominal pain, nausea and vomiting. Work up done indicated SBO and pt has required Ex Lap for lysis of adhesions. He subsequently had an anastomotic leak with an abscess which has necessitated an omental patching of the anastomosis and percutaneous drain placement. The patient had an elevation of his white blood cell count and CT of the abdomen revealed an enlarging abscess an enteric fistula was obstructed drainage tube necessitated repositioning of the percutaneous drain a percutaneous drain. Presently the patient has an open abdominal wound which is connected with the abscess cavity and is being managed by general surgery and wound ostomy care. Repeat CT abdomen as noted below indicated resolution of abscess and per IR attempt was made 05/31/2012 to try place central line from subclavian or jugular vein but right upper extremity venogram demonstrated occlusion of right innominate vein. Therefore, a right arm midline catheter was placed with no immediate complication. This was needed in order to allow pt more mobility in order to proceed with discharge planning. However,this line is not adequate for feeding as it had to be left short given veins occlusions. Plan is to have IR remove current femoral access and replace it with tunneled cath in the groin area as well and use that as feeding access.   Principal Problem:  *SBO (small bowel obstruction)  - patient is s/p exploratory laparotomy and lysis of adhesions and partial small bowel x 2 on 7/24.  - Patient has had subsequent anastomotic leak  - omental patch of anastomosis on 8/2.  - On 8/8,  CT abdomen and pelvis demonstrated several fluid collections consistent with abscess, largest measuring 16.5 x 13.7 x 6.4 cm.  - Patient is status post percutaneous drainage of largest fluid collection 04/30/2012.  - Repeat CT done for increasing WBC count on 8/13 revealed enlarging abscess and an enteric fistula with obstructed drainage tube.  - The drain was repositioned on 8/14. Since that time, he has had an open abdominal wound which connects to an abscess cavity, which has been slowly closing.  - Zofran and phenergan prn nausea and/or vomiting  - repeat CT abdomen 05/29/2012 looking better, d/w surgery progress and advancing diet  - pt currently tolerating soft solid diet  -zosyn was discontinued. Will also DC fluconazole today.  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  - Patient was not able to bend the right hip per interventional radiology but removing lines was planned in an attempt to give pt more moility  -Plan for tunneled femoral catheter in am.  Chronic nonocclusive left lower extremity DVT.  -  resolved  - Diagnosed via Doppler 04/30/2012 and follow up on 8/21 showed no evidence of DVT, superficial thrombosis or baker's cyst.   CKD (chronic kidney disease) stage V requiring chronic dialysis  - Dialysis per nephrology schedule  - back to [facility] per [facility] schedule   HTN (hypertension),    - BP labile but relatively controlled  - Continue to monitor vitals per floor protocol   Anemia due to chronic disease  - Stable  - Has received a total of 4 units of packed red blood cells this admission  - Continue aranesp and ferric gluconate   GOUT  - Patient had been on pred 5mg  daily. Weaned IV hydrocortisone. Currently at 25 mg daily.  - Wean to 20mg  daily 9/2 (equivalent to pred 5) and will continue until tolerating PO   Leg graft occlusion  - s/p thrombectomy 7/25  - unfortunately re clotted and undergone an additional thrombectomy procedure with revision of arterial and graft on 04/27/2012.   Severe protein calorie malnutrition  - Patient is cachectic  - Parenteral nutrition managed by surgery and pharmacy.   Consultants:  General surgery  Nephrology  Infectious disease   Procedures:  7/24 Ex-lap, lysis of adhesions, partial small bowel resection x2  7/25 Thrombectomy L femoral AV graft  7/26 Extubated, CVL is out  7/27 Transferred to telemetry under [facility]  8/2 Acute abdomen, dehiscence of anastomosis, SBO, back from OR intubated in ICU on PCCM service. Found to have moderate peritonitis infraumbilically  8/2 Extubated   Diagnostic procedures:  US Renal  05/31/2012  IMPRESSION:  1. Indeterminate left renal mass. This could represent a cyst complicated by prior hemorrhage/infection, or a cystic neoplasm. Potential clinical strategies would include surveillance with CT, further characterization with MRI, or surgical resection.  2. Findings of medical renal disease.  3. Left-sided pleural effusion.  4. Left iliac fossa renal transplant with transplant cortical thinning and increased echogenicity, suggesting transplant failure.   Ir US Guide Vasc Access Right  05/31/2012  Impression:  Right upper extremity venogram demonstrates occlusion of the right innominate vein.  As a result, a right jugular or right subclavian central line could not be placed. Placement of a  right upper arm PICC line with the tip in the right subclavian vein.   CT abdomen and pelvis  05/29/2012  IMPRESSION:  1. The patient's right lower quadrant abscess is now essentially completely resolved. A drainage catheter remains in place within the abscess cavity which is completely decompressed.  2. There is there is a small filling defect associated with tip of the right femoral central venous catheter, which could represent catheter associated thrombus.  3. A 3.2 x 3.0 cm intermediate attenuation left renal lesion may simply represent a proteinaceous cyst.  4. Mild thickening and enhancement of the gallbladder wall. No evidence of gallstones. This finding is nonspecific, but not favored to represent acute cholecystitis.  5. Additional incidental findings, as above, similar to prior examinations.   Antibiotics:  Zosyn 8/1 >>> (tentative stop date of 9/16 with CT scan at that time)  Micafungin 8/3 >>8/16  Invanz 7/24 periop  Fluconazole- 8/2 + 8/17 >> (tentative stop date of 9/16 with CT scan at that time)  Vancomycin 8/8 >>> 05/01/2012   Code Status: Full  Family Communication: Pt at bedside over 60 minutes and sister over the phone  Disposition Plan: Likely SNF if they accept the current subclavicular line for feeding access, if they don't then current femoral line will be removed  in AM and will need to be replaced by tunneled cath in the groin area as well and that would likely be done 1-2 days after current femoral line removed, so likely plan for d/c is this Friday  HPI/Subjective: No events overnight.   Objective: Filed Vitals:   06/03/12 0810 06/03/12 0815 06/03/12 0820 06/03/12 0934  BP: 176/91 178/95 167/90 161/87  Pulse:    74  Temp:    97.8 F (36.6 C)  TempSrc:      Resp:    16  Height:      Weight:      SpO2:    100%    Intake/Output Summary (Last 24 hours) at 06/03/12 1146 Last data filed at 06/03/12 0900  Gross per 24 hour  Intake 659.08 ml  Output      10 ml  Net 649.08 ml    Exam:   General:  Pt is alert, follows commands appropriately, not in acute distress, cachectic  Cardiovascular: Regular rate and rhythm, S1/S2, no murmurs, no rubs, no gallops  Respiratory: Clear to auscultation bilaterally, no wheezing, no crackles, no rhonchi  Abdomen: Soft, non tender, non distended, bowel sounds present, no guarding  Extremities: No edema, pulses DP and PT palpable bilaterally  Neuro: Grossly nonfocal  Data Reviewed: Basic Metabolic Panel:  Lab 06/03/12 1191 06/02/12 0552 06/01/12 0430 05/31/12 0650 05/29/12 0953  NA 135 135 130* 133* 127*  K 4.3 3.5 3.2* 3.1* 3.3*  CL 95* 97 92* 93* 90*  CO2 24 26 22 22  18*  GLUCOSE 72 99 92 88 86  BUN 56* 39* 73* 56* 72*  CREATININE 6.49* 4.59* 6.77* 5.43* 5.76*  CALCIUM 10.6* 9.6 9.9 10.2 10.0  MG 1.7 -- -- 1.6 --  PHOS 4.2 -- 3.4 2.9 3.1   Liver Function Tests:  Lab 06/03/12 0600 06/01/12 0430 05/31/12 0650 05/29/12 0953  AST -- -- 37 --  ALT -- -- 34 --  ALKPHOS -- -- 173* --  BILITOT -- -- 1.5* --  PROT -- -- 5.4* --  ALBUMIN 1.8* 1.6* 1.7* 1.6*   No results found for this basename: LIPASE:5,AMYLASE:5 in the last 168 hours No results found for this basename: AMMONIA:5 in the last 168 hours CBC:  Lab 06/03/12 0600 06/02/12 0552 06/01/12 0430 05/31/12 0650 05/29/12 0953  WBC 6.9 6.5 6.2 6.3 7.0  NEUTROABS -- -- -- 3.5 --  HGB 12.8* 12.7* 11.8* 12.7* 12.5*  HCT 38.2* 37.4* 35.3* 38.3* 36.5*  MCV 81.8 81.7 81.9 83.4 80.9  PLT 134* 128* 118* 129* 134*   Cardiac Enzymes: No results found for this basename: CKTOTAL:5,CKMB:5,CKMBINDEX:5,TROPONINI:5 in the last 168 hours BNP: No components found with this basename: POCBNP:5 CBG:  Lab 06/03/12 0738 06/03/12 0057 06/02/12 1644 06/02/12 0733 06/02/12 0031  GLUCAP 80 80 129* 109* 121*    Recent Results (from the past 240 hour(s))  MRSA PCR SCREENING     Status: Normal   Collection Time   05/31/12 10:18 AM      Component Value  Range Status Comment   MRSA by PCR NEGATIVE  NEGATIVE Final      Scheduled Meds:    . antiseptic oral rinse  15 mL Mouth Rinse q12n4p  . feeding supplement (NEPRO CARB STEADY)  237 mL Oral TID BM  . feeding supplement  1 Container Oral Q24H  . fentaNYL  37.5 mcg Transdermal Q72H  . fluconazole  400 mg Oral Q T,Th,Sa-HD  . heparin  40 Units/kg Dialysis Once in dialysis  .  insulin aspart  0-15 Units Subcutaneous Q8H  . lip balm   Topical BID  . metoprolol tartrate  12.5 mg Oral BID  . pantoprazole  40 mg Oral Q1200  . predniSONE  5 mg Oral Q breakfast  . simethicone  80 mg Oral QID  . sodium chloride  10-40 mL Intracatheter Q12H  . DISCONTD: fluconazole (DIFLUCAN) IV  400 mg Intravenous Q T,Th,Sa-HD  . DISCONTD: pantoprazole (PROTONIX) IV  40 mg Intravenous Daily  . DISCONTD: piperacillin-tazobactam (ZOSYN)  IV  2.25 g Intravenous Q8H   Continuous Infusions:    . TPN (CLINIMIX) +/- additives 45 mL/hr at 06/01/12 1719     Chaya Jan, MD  [facility] Pager [phone]  If 7PM-7AM, please contact night-coverage [email or web address] Password [email or web address] 06/03/2012, 11:46 AM   LOS: 62 days

## 2012-06-03 NOTE — Progress Notes (Addendum)
Nutrition Follow-up/Consult  Intervention:   1. Continue TPN per pharmacy 2. Continue Breeze and Nepro as able. Provided emotional support and encouraged pt to consume liquids as able throughout the dayommend trophic enteral feedings once able - will defer to surgery 3. RD to continue to follow nutrition care plan   Assessment:   Planning to d/c to SNF on Friday. TPN on hold today 2/2 lack of large vein access, catheter to be placed Friday. Pt asking this RD about advancement of diet. Discussed most recent surgery note stating that he will plan to try low fiber.  Prealbumin, potassium, and phosphorus WNL.  Diet Order:  NPO (previously on Full Liquids) Supplement: Resource Breeze daily and Nepro TID  Meds: Scheduled Meds:    . antiseptic oral rinse  15 mL Mouth Rinse q12n4p  . feeding supplement (NEPRO CARB STEADY)  237 mL Oral TID BM  . feeding supplement  1 Container Oral Q24H  . fentaNYL  37.5 mcg Transdermal Q72H  . fluconazole  400 mg Oral Q T,Th,Sa-HD  . heparin  40 Units/kg Dialysis Once in dialysis  . insulin aspart  0-15 Units Subcutaneous Q8H  . lip balm   Topical BID  . metoprolol tartrate  12.5 mg Oral BID  . pantoprazole  40 mg Oral Q1200  . predniSONE  5 mg Oral Q breakfast  . simethicone  80 mg Oral QID  . sodium chloride  10-40 mL Intracatheter Q12H  . DISCONTD: fluconazole (DIFLUCAN) IV  400 mg Intravenous Q T,Th,Sa-HD  . DISCONTD: pantoprazole (PROTONIX) IV  40 mg Intravenous Daily  . DISCONTD: piperacillin-tazobactam (ZOSYN)  IV  2.25 g Intravenous Q8H   Continuous Infusions:    . TPN (CLINIMIX) +/- additives 45 mL/hr at 06/01/12 1719   PRN Meds:.sodium chloride, acetaminophen, acetaminophen, albuterol, heparin, lidocaine, lidocaine-prilocaine, morphine injection, nitroGLYCERIN, ondansetron (ZOFRAN) IV, ondansetron, pentafluoroprop-tetrafluoroeth, promethazine, sodium chloride, DISCONTD: heparin, DISCONTD: heparin  Labs:  CMP     Component Value Date/Time    NA 135 06/03/2012 0600   K 4.3 06/03/2012 0600   CL 95* 06/03/2012 0600   CO2 24 06/03/2012 0600   GLUCOSE 72 06/03/2012 0600   BUN 56* 06/03/2012 0600   CREATININE 6.49* 06/03/2012 0600   CALCIUM 10.6* 06/03/2012 0600   PROT 5.4* 05/31/2012 0650   ALBUMIN 1.8* 06/03/2012 0600   AST 37 05/31/2012 0650   ALT 34 05/31/2012 0650   ALKPHOS 173* 05/31/2012 0650   BILITOT 1.5* 05/31/2012 0650   GFRNONAA 9* 06/03/2012 0600   GFRAA 10* 06/03/2012 0600   Phosphorus  Date/Time Value Range Status  06/03/2012  6:00 AM 4.2  2.3 - 4.6 mg/dL Final  2/95/6213  0:86 AM 3.4  2.3 - 4.6 mg/dL Final  01/27/8468  6:29 AM 2.9  2.3 - 4.6 mg/dL Final   Potassium  Date/Time Value Range Status  06/03/2012  6:00 AM 4.3  3.5 - 5.1 mEq/L Final     DELTA CHECK NOTED  06/02/2012  5:52 AM 3.5  3.5 - 5.1 mEq/L Final  06/01/2012  4:30 AM 3.2* 3.5 - 5.1 mEq/L Final   Magnesium  Date/Time Value Range Status  06/03/2012  6:00 AM 1.7  1.5 - 2.5 mg/dL Final  01/22/8412  2:44 AM 1.6  1.5 - 2.5 mg/dL Final  0/09/270  5:36 AM 1.5  1.5 - 2.5 mg/dL Final   Prealbumin  Date/Time Value Range Status  05/31/2012  6:50 AM 24.7  17.0 - 34.0 mg/dL Final  02/23/4033  7:42 AM 24.7  17.0 -  34.0 mg/dL Final  1/61/0960  4:54 AM 23.4  17.0 - 34.0 mg/dL Final   Lipid Panel     Component Value Date/Time   CHOL 173 05/31/2012 0650   TRIG 125 05/31/2012 0650    Intake/Output Summary (Last 24 hours) at 06/03/12 1143 Last data filed at 06/03/12 0900  Gross per 24 hour  Intake 659.08 ml  Output     10 ml  Net 649.08 ml  BM 9/11  Weight Status: 48.4 kg s/p HD on 9/10 46.9 kg s/p HD on 9/5 49.6 kg s/p HD on 9/3  49.8 kg s/p HD on 9/3  53.1 kg s/p HD on 8/27  51.5 kg s/p HD on 8/22 52.1 kg s/p HD on 8/20  54 kg s/p HD on 8/17 63.8 kg s/p HD on 8/12 65.7 kg s/p HD on 8/8  50.5 kg s/p HD on 8/6 50 kg s/p HD on 8/5 47.8 kg s/p HD on 7/30  48.3 kg s/p HD on 7/27  Body mass index is 15.73 kg/(m^2). Underweight.  Estimated needs:  [calculated using  the Kidney Disease Outcomes Quality Initiative (KDOQI) Adjustment Equation for the Underweight Patient]: 1700 - 1950 kcal, 80 - 100 grams protein daily  Nutrition Dx:  Inadequate oral intake now R/T GI distress AEB pt report. Ongoing.  Goal:  Intake to meet at least 90% of estimated needs. Not met presently - pt NPO and TPN on hold  Monitor:  Diet advancement, TPN adequacy, weights, labs, I/O's, enteral nutrition initiation  Jarold Motto MS, RD, LDN Pager: [phone] After-hours pager: [phone]

## 2012-06-03 NOTE — Progress Notes (Addendum)
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 Allopurinol     REACTION: decreased platelets  . Aspirin     REACTION: unspecified    Patient Measurements: Height: 5\' 11"  (180.3 cm) Weight: 112 lb 12.8 oz (51.166 kg) IBW/kg (Calculated) : 75.3  Adjusted Body Weight:  Usual Weight:   Vital Signs: Temp: 98 F (36.7 C) (09/12 0508) Temp src: Oral (09/12 0508) BP: 149/69 mmHg (09/12 0508) Pulse Rate: 80  (09/12 0508) Intake/Output from previous day: 09/11 0701 - 09/12 0700 In: 664.1 [TPN:659.1] Out: 17 [Drains:17] Intake/Output from this shift:    Labs:  [facility] 06/03/12 0600 06/02/12 0552 06/01/12 0430  WBC 6.9 6.5 6.2  HGB 12.8* 12.7* 11.8*  HCT 38.2* 37.4* 35.3*  PLT 134* 128* 118*  APTT 32 -- --  INR 1.18 -- --     Basename 06/03/12 0600 06/02/12 0552 06/01/12 0430  NA 135 135 130*  K 4.3 3.5 3.2*  CL 95* 97 92*  CO2 24 26 22   GLUCOSE 72 99 92  BUN 56* 39* 73*  CREATININE 6.49* 4.59* 6.77*  LABCREA -- -- --  CREAT24HRUR -- -- --  CALCIUM 10.6* 9.6 9.9  MG 1.7 -- --  PHOS 4.2 -- 3.4  PROT -- -- --  ALBUMIN 1.8* -- 1.6*  AST -- -- --  ALT -- -- --  ALKPHOS -- -- --  BILITOT -- -- --  BILIDIR -- -- --  IBILI -- -- --  PREALBUMIN -- -- --  TRIG -- -- --  CHOLHDL -- -- --  CHOL -- -- --   Estimated Creatinine Clearance: 9.6 ml/min (by C-G formula based on Cr of 6.49).    Basename 06/03/12 0738 06/03/12 0057 06/02/12 1644  GLUCAP 80 80 129*   Insulin Requirements in the past 24 hours:  2 units SSI, 15 units in TPN  Current Nutrition:  Full liquids, eating 0% of recent meals.  Nepro TID and Resource daily are primarily refused Clinimix 5/15 (no lytes) 45 ml/hr providing 54 g protein/d (68% of goal) and 874 avg kcal/d (50% of goal)- held 9/11 and 9/12 2/2 line removal  Nutritional Goals:  1700-1950 kCal, 80-100 grams of protein per day    Assessment: Admitted 7/13 with abd pain, weight loss, SBO. S/p LOA, SBR  7/24 and repeat LOA, SBR with anasomotic leak repair 8/1, thrombectomy with AVG revision 7/25 and repeat thrombectomy 8/6. 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   GI: Tolerating TPN. Pt's wt appears to be relatively stable. Gravity drain bag due to controlled SB. Now on Fulls with Nepro TID (mostly refused) & Resource qday (mostly refused)- tolerating PO but feels full quickly. Doesn't like to eat before HD.  (+)BMs, (+)gas. Simethicone. 9/8: Plan wean TPN as calorie intake allows- may be several weeks.  0-25% meals eaten. Endo: CBGs < 150 since rate decrease & minimal SSI. Changed to pred 5 daily on 9/12 (on PTA)  Lytes: Na 135, K 4.3-up (4K bath), Phos 4.2, Mag 1.7. Hypercalcemia continues (corrected Ca 12.4, Ca x PO4 remains < 55, Ca bath). Renal MD replacing lytes, on no binders  Renal: ESRD with HD back on TTS schedule. Current placement HD access limiting mobility, PICC inserted 9/9, cannot be used for TPN, CSW waiting on SNF to [place] lines before d/c. On Aranesp qSat- now on hold with Hb 12.7.  Cards: BP variable. (HTN d/t volume, BP  nml-low during HD). On Lopressor Hepatobil: LFTs WNL, except alk phos/Tbili elevated. Prealbumin remains at goal. Trig WNL 128  Neuro: GCS 20, no c/o pain - on fentanyl patch, no prn pain meds needed ID: Fluconazole + Zosyn for peritonitis/abd abscess -- Noted primary team plans to cont abx thru 9/16, then re-CT abd and consult with ID for further recs on abx duration. Afeb, WBC WNL. Abd abscess culture: candida tropicalis. RLQ drain to abscess- improved, but continues to drain- output feculent in nature. WBC nml, afeb Zosyn 8.1>> 9.11 Diflucan 8.16>> (s/p 14 d mycamine)  Heme/Onc: Hgb up to 12.8 on Aranesp q Saturday (on hold), plt stable Best Practices: Mouthcare, PPI po, d/c to SNF in 1-2 days  Plan:  1. Will hold TPN tonight due to lack of access, noted tunneled femoral  catheter placement on 9/13 and removal of previous femoral catheter 9/11 2. Once tunneled femoral catheter can be used, resume Clinimix 5/15 (no lytes) at decreased rate of 45 ml/hr, weaned per MD, to provide some caloric and protein support while patient increases PO intake, this will provide 54 g protein/d (68% of goal) and 874 avg kcal/d (50% of goal). MD/RD aware TPN not meeting kcal goals with Clinimix alone. Prealbumin wnl = 24.7 3. Electrolyte replacement per renal MD  4. Provide available trace elements, MVI and lipids @5cc /hr on MWF only d/t national backorder  5. F/up PO intake to assess future TPN needs  6. Change PPI, fluconazole to PO   Thank you for the consult.  Tomi Bamberger, PharmD Clinical Pharmacist Pager: [phone] Pharmacy: [phone] 06/03/2012 10:32 AM

## 2012-06-04 ENCOUNTER — Inpatient Hospital Stay (HOSPITAL_COMMUNITY): Payer: Medicare Other

## 2012-06-04 LAB — RENAL FUNCTION PANEL
Albumin: 2 g/dL — ABNORMAL LOW (ref 3.5–5.2)
BUN: 18 mg/dL (ref 6–23)
Calcium: 9.4 mg/dL (ref 8.4–10.5)
Glucose, Bld: 111 mg/dL — ABNORMAL HIGH (ref 70–99)
Phosphorus: 2.2 mg/dL — ABNORMAL LOW (ref 2.3–4.6)
Potassium: 3.7 mEq/L (ref 3.5–5.1)

## 2012-06-04 LAB — GLUCOSE, CAPILLARY: Glucose-Capillary: 80 mg/dL (ref 70–99)

## 2012-06-04 MED ORDER — IOHEXOL 300 MG/ML  SOLN
50.0000 mL | Freq: Once | INTRAMUSCULAR | Status: AC | PRN
  Administered 2012-06-04: 10 mL via INTRAVENOUS

## 2012-06-04 MED ORDER — NEPRO/CARBSTEADY PO LIQD: 237.0000 mL | Freq: Three times a day (TID) | ORAL | Status: DC

## 2012-06-04 MED ORDER — SELENIUM 40 MCG/ML IV SOLN
INTRAVENOUS | Status: DC
  Administered 2012-06-04: 18:00:00 via INTRAVENOUS
  Filled 2012-06-04: qty 2000

## 2012-06-04 MED ORDER — FAT EMULSION 20 % IV EMUL
120.0000 mL | INTRAVENOUS | Status: DC
  Administered 2012-06-04: 120 mL via INTRAVENOUS
  Filled 2012-06-04: qty 200

## 2012-06-04 MED ORDER — MIDAZOLAM HCL 5 MG/5ML IJ SOLN
INTRAMUSCULAR | Status: AC | PRN
  Administered 2012-06-04 (×2): 1 mg via INTRAVENOUS

## 2012-06-04 MED ORDER — PANTOPRAZOLE SODIUM 40 MG PO TBEC: 40.0000 mg | DELAYED_RELEASE_TABLET | Freq: Every day | ORAL | Status: DC

## 2012-06-04 MED ORDER — INFLUENZA VIRUS VACC SPLIT PF IM SUSP
0.5000 mL | INTRAMUSCULAR | Status: AC
  Administered 2012-06-05: 0.5 mL via INTRAMUSCULAR
  Filled 2012-06-04: qty 0.5

## 2012-06-04 MED ORDER — FENTANYL CITRATE 0.05 MG/ML IJ SOLN
INTRAMUSCULAR | Status: AC | PRN
  Administered 2012-06-04 (×2): 50 ug via INTRAVENOUS

## 2012-06-04 MED ORDER — FAT EMULSION 20 % IV EMUL: 120.0000 mL | INTRAVENOUS | Status: DC

## 2012-06-04 MED ORDER — ZINC TRACE METAL 1 MG/ML IV SOLN
INTRAVENOUS | Status: DC
  Filled 2012-06-04: qty 2000

## 2012-06-04 MED ORDER — FENTANYL 12 MCG/HR TD PT72: 1.0000 | MEDICATED_PATCH | TRANSDERMAL | Status: AC

## 2012-06-04 NOTE — Progress Notes (Signed)
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 Allopurinol     REACTION: decreased platelets  . Aspirin     REACTION: unspecified    Patient Measurements: Height: 5\' 11"  (180.3 cm) Weight: 105 lb 9.6 oz (47.9 kg) IBW/kg (Calculated) : 75.3    Vital Signs: Temp: 98.4 F (36.9 C) (09/13 1000) Temp src: Oral (09/13 1000) BP: 124/74 mmHg (09/13 1225) Pulse Rate: 103  (09/13 1200) Intake/Output from previous day: 09/12 0701 - 09/13 0700 In: 240 [P.O.:240] Out: 2066  Intake/Output from this shift:    Labs:  Basename 06/03/12 0600 06/02/12 0552  WBC 6.9 6.5  HGB 12.8* 12.7*  HCT 38.2* 37.4*  PLT 134* 128*  APTT 32 --  INR 1.18 --     Basename 06/04/12 0300 06/03/12 0600 06/02/12 0552  NA 138 135 135  K 3.7 4.3 3.5  CL 98 95* 97  CO2 29 24 26   GLUCOSE 111* 72 99  BUN 18 56* 39*  CREATININE 3.43* 6.49* 4.59*  LABCREA -- -- --  CREAT24HRUR -- -- --  CALCIUM 9.4 10.6* 9.6  MG -- 1.7 --  PHOS 2.2* 4.2 --  PROT -- -- --  ALBUMIN 2.0* 1.8* --  AST -- -- --  ALT -- -- --  ALKPHOS -- -- --  BILITOT -- -- --  BILIDIR -- -- --  IBILI -- -- --  PREALBUMIN -- -- --  TRIG -- -- --  CHOLHDL -- -- --  CHOL -- -- --   Estimated Creatinine Clearance: 17.1 ml/min (by C-G formula based on Cr of 3.43).    Basename 06/04/12 0757 06/04/12 0032 06/03/12 1709  GLUCAP 80 67* 89   Insulin Requirements in the past 24 hours:  0 units SSI  Current Nutrition:  NPO.  TPN has been off 9/11 and 9/12 due to limited central IV access.  S/p placement Hickman cath today in IR.  Will resume Clinimix 5/15 (no lytes) at 45 ml/hr providing 54 g protein/d (68% of goal) and 874 avg kcal/d (50% of goal)  Nutritional Goals:  1700-1950 kCal, 80-100 grams of protein per day   Assessment: Admitted 7/13 with abd pain, weight loss, SBO. S/p LOA, SBR  7/24 and repeat LOA, SBR with anasomotic leak repair 8/1,  thrombectomy with AVG revision 7/25 and repeat thrombectomy 8/6. 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   GI:  Pt's wt appears to be relatively stable.  Full liquid diet  Doesn't like to eat before HD.  Plan wean TPN as calorie intake allows- may be several weeks.  Endo: CBGs < 150 since rate decrease & minimal SSI. Changed to pred 5 daily on 9/12 (on PTA)  Lytes: Na 138, K 3.7, Phos 2.2, Mag 1.7. Hypercalcemia continues (corrected Ca 11, Ca x PO4 remains < 55,. Renal MD to replete electrolytes Renal: ESRD with HD back on TTS schedule. Current placement HD access limiting mobility, PICC inserted 9/9, cannot be used for TPN, CSW waiting on SNF to [place] lines before d/c. On Aranesp qSat- now on hold with Hb 12.8.  Cards: BP variable. (HTN d/t volume, BP nml-low during HD). On Lopressor Hepatobil: LFTs WNL, except alk phos/Tbili elevated. Prealbumin remains at goal. Trig WNL 125 Neuro: GCS 20, no c/o pain - on fentanyl patch, no prn pain meds needed ID: Fluconazole + Zosyn for peritonitis/abd abscess -- Noted primary team plans to cont abx thru  9/16, then re-CT abd and consult with ID for further recs on abx duration. Afeb, WBC WNL. Abd abscess culture: candida tropicalis. RLQ drain to abscess-  Zosyn 8.1>> 9.11 Diflucan 8.16>> (s/p 14 d mycamine)  Heme/Onc: Hgb up to 12.8 on Aranesp q Saturday (on hold), plt stable Best Practices: Mouthcare, PPI po, d/c to SNF in 1-2 days  Plan:  1. Resume TPN tonight ,Clinimix 5/15 (no lytes) at decreased rate of 45 ml/hr, to provide some caloric and protein support while patient increases PO intake, this will provide 54 g protein/d (68% of goal) and 874 avg kcal/d (50% of goal).  2. Electrolyte replacement per renal MD  3. Provide available trace elements, MVI and lipids @5cc /hr on MWF only d/t national backorder  4. F/up PO intake to assess future TPN needs     Thank you for the consult.  Talbert Cage, PharmD Clinical Pharmacist Pager:  [phone] Pharmacy: [phone] 06/04/2012 12:32 PM

## 2012-06-04 NOTE — Progress Notes (Signed)
Clinically the patient still has a fistula.  He has enteric contents coming out of drain and there is gas in the bag.  The fistulogram will help identify where the fistula is connected.  Marta Lamas. Gae Bon, MD, FACS [phone] [facility]

## 2012-06-04 NOTE — Progress Notes (Signed)
38 Days Post-Op  Subjective: No complaints or discomfort.  Objective: Vital signs in last 24 hours: Temp:  [97.6 F (36.4 C)-99.3 F (37.4 C)] 99.3 F (37.4 C) (09/13 0557) Pulse Rate:  [74-101] 101  (09/13 0557) Resp:  [15-22] 17  (09/13 0557) BP: (114-178)/(61-101) 116/61 mmHg (09/13 0557) SpO2:  [97 %-100 %] 99 % (09/13 0557) Weight:  [45.95 kg (101 lb 4.8 oz)-47.9 kg (105 lb 9.6 oz)] 47.9 kg (105 lb 9.6 oz) (09/13 0029) Last BM Date: 06/02/12  Intake/Output from previous day: 09/12 0701 - 09/13 0700 In: 240 [P.O.:240] Out: 2066  Intake/Output this shift:    General appearance: alert, cooperative and no distress GI: soft, non-tender; bowel sounds normal; no masses,  no organomegaly and drain bag has some gas and some dark black-green fluid nothing recorded yesterday, 17 ml day prior. Few CC's of fluid in bag now.  Lab Results:   Basename 06/03/12 0600 06/02/12 0552  WBC 6.9 6.5  HGB 12.8* 12.7*  HCT 38.2* 37.4*  PLT 134* 128*    BMET  Basename 06/04/12 0300 06/03/12 0600  NA 138 135  K 3.7 4.3  CL 98 95*  CO2 29 24  GLUCOSE 111* 72  BUN 18 56*  CREATININE 3.43* 6.49*  CALCIUM 9.4 10.6*   PT/INR  Basename 06/03/12 0600  LABPROT 15.3*  INR 1.18     Lab 06/04/12 0300 06/03/12 0600 06/01/12 0430 05/31/12 0650 05/29/12 0953  AST -- -- -- 37 --  ALT -- -- -- 34 --  ALKPHOS -- -- -- 173* --  BILITOT -- -- -- 1.5* --  PROT -- -- -- 5.4* --  ALBUMIN 2.0* 1.8* 1.6* 1.7* 1.6*     Lipase  No results found for this basename: lipase     Studies/Results: No results found.  Medications:    . antiseptic oral rinse  15 mL Mouth Rinse q12n4p  . feeding supplement (NEPRO CARB STEADY)  237 mL Oral TID BM  . feeding supplement  1 Container Oral Q24H  . fentaNYL  37.5 mcg Transdermal Q72H  . heparin  40 Units/kg Dialysis Once in dialysis  . influenza  inactive virus vaccine  0.5 mL Intramuscular Tomorrow-1000  . insulin aspart  0-15 Units Subcutaneous Q8H    . lip balm   Topical BID  . metoprolol tartrate  12.5 mg Oral BID  . pantoprazole  40 mg Oral Q1200  . predniSONE  5 mg Oral Q breakfast  . simethicone  80 mg Oral QID  . sodium chloride  10-40 mL Intracatheter Q12H  . DISCONTD: fluconazole (DIFLUCAN) IV  400 mg Intravenous Q T,Th,Sa-HD  . DISCONTD: fluconazole  400 mg Oral Q T,Th,Sa-HD  . DISCONTD: pantoprazole (PROTONIX) IV  40 mg Intravenous Daily    Assessment/Plan Exploratory laparotomy with lysis of adhesions and small bowel resection x2, 04/14/2012, [place]is Pu. 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 Exploratory laparotomy.  2. Lysis of adhesions.  3. Small bowel resection with side-to-side stapled anastomosis.  4. Omental patch of small bowel anastomosis. Ardeth Sportsman, MD, FACS.  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  SBO (small  bowel obstruction) s/p EL/LOA and SBR x 2   .  CKD (chronic kidney disease) stage V requiring chronic dialysis Hx of renal transplant 17 years ago   .  Thrombocytopenia   .  Gout   .  HTN (hypertension)   .  Chronic use of steroids   .  Malnutrition, calorie   .  Leg graft occlusion   .  Acute respiratory failure with hypoxia   .  Sinus tachycardia   .  Abscess of abdominal cavity   .  Wound dehiscence   .  Bleeding   .  Anemia    Plan:  Will get radiology to inject drain and see if fistula is still present.      LOS: 63 days    Nikka Hakimian 06/04/2012

## 2012-06-04 NOTE — Progress Notes (Signed)
Subjective:  Starting to get out of bed with PT, No cos. / mild cramps  end of HD yesterday Objective Vital signs in last 24 hours: Filed Vitals:   06/04/12 0005 06/04/12 0029 06/04/12 0052 06/04/12 0557  BP: 133/81  135/75 116/61  Pulse: 93  101 101  Temp: 97.6 F (36.4 C)  97.6 F (36.4 C) 99.3 F (37.4 C)  TempSrc: Oral  Oral Oral  Resp: 20  18 17   Height:      Weight:  47.9 kg (105 lb 9.6 oz)    SpO2:   100% 99%   Weight change: -5.216 kg (-11 lb 8 oz)  Intake/Output Summary (Last 24 hours) at 06/04/12 0907 Last data filed at 06/04/12 0005  Gross per 24 hour  Intake    240 ml  Output   2066 ml  Net  -1826 ml   Labs: Basic Metabolic Panel:  Lab 06/04/12 4098 06/03/12 0600 06/02/12 0552 06/01/12 0430  NA 138 135 135 --  K 3.7 4.3 3.5 --  CL 98 95* 97 --  CO2 29 24 26  --  GLUCOSE 111* 72 99 --  BUN 18 56* 39* --  CREATININE 3.43* 6.49* 4.59* --  CALCIUM 9.4 10.6* 9.6 --  ALB -- -- -- --  PHOS 2.2* 4.2 -- 3.4   Liver Function Tests:  Lab 06/04/12 0300 06/03/12 0600 06/01/12 0430 05/31/12 0650  AST -- -- -- 37  ALT -- -- -- 34  ALKPHOS -- -- -- 173*  BILITOT -- -- -- 1.5*  PROT -- -- -- 5.4*  ALBUMIN 2.0* 1.8* 1.6* --   No results found for this basename: LIPASE:3,AMYLASE:3 in the last 168 hours No results found for this basename: AMMONIA:3 in the last 168 hours CBC:  Lab 06/03/12 0600 06/02/12 0552 06/01/12 0430 05/31/12 0650 05/29/12 0953  WBC 6.9 6.5 6.2 -- --  NEUTROABS -- -- -- 3.5 --  HGB 12.8* 12.7* 11.8* -- --  HCT 38.2* 37.4* 35.3* -- --  MCV 81.8 81.7 81.9 83.4 80.9  PLT 134* 128* 118* -- --   Cardiac Enzymes: No results found for this basename: CKTOTAL:5,CKMB:5,CKMBINDEX:5,TROPONINI:5 in the last 168 hours CBG:  Lab 06/04/12 0757 06/04/12 0032 06/03/12 1709 06/03/12 1635 06/03/12 0738  GLUCAP 80 67* 89 60* 80    Iron Studies: No results found for this basename: IRON,TIBC,TRANSFERRIN,FERRITIN in the last 72 hours Studies/Results: No  results found. Medications:      . antiseptic oral rinse  15 mL Mouth Rinse q12n4p  . feeding supplement (NEPRO CARB STEADY)  237 mL Oral TID BM  . feeding supplement  1 Container Oral Q24H  . fentaNYL  37.5 mcg Transdermal Q72H  . heparin  40 Units/kg Dialysis Once in dialysis  . influenza  inactive virus vaccine  0.5 mL Intramuscular Tomorrow-1000  . insulin aspart  0-15 Units Subcutaneous Q8H  . lip balm   Topical BID  . metoprolol tartrate  12.5 mg Oral BID  . pantoprazole  40 mg Oral Q1200  . predniSONE  5 mg Oral Q breakfast  . simethicone  80 mg Oral QID  . sodium chloride  10-40 mL Intracatheter Q12H  . DISCONTD: fluconazole (DIFLUCAN) IV  400 mg Intravenous Q T,Th,Sa-HD  . DISCONTD: fluconazole  400 mg Oral Q T,Th,Sa-HD  . DISCONTD: pantoprazole (PROTONIX) IV  40 mg Intravenous Daily   I  have reviewed scheduled and prn medications.  Physical Exam: General: Alert, NAD, sitting on side of bed Heart: RRR, no  murmurs Lungs: CTA bilaterally Abdomen: Bs pos. Soft , nontender, surgical bandage dry /clean Extremities: Dialysis Access: no pedal edema, right groin drain with brown dc in bag / left fem. avgg pos. Bruit   Assessment/Plan:  1. SBO - s/p surg/dehisc/abscess with controlled EC fistula and pelvic drain, CCS ordering radiology  Injection to eval  Fistula this am/on PO liquids and IV Zosyn & Diflucan, still requiring TNA; taking some liquids.   Tight Heparin with HD  Restarted  yesterday. 2. ESRD - HD on TTS @ [facility], K 3.7 today. 3. Anemia - Hgb 12.8 Aranesp on hold / no iron. 4. MBD/hypercalcemia - Ca 9.4 (11.4corrected), phos 2.2 off binders, no Zemplar 5. HTN/volume - Does not appear volume overloaded. IV BP meds d/c'd Metoprolol started conservatively at 12.5 BID. Will see how BP does with HD  and volume removal and titrate metoprolol accordingly. Goal EDW should probably be ~ 48 - 49 6. Malnutrition - on Full liquids and TNA, Alb 2.0 TODAY prealbumin 24.7 9/9).  Midline PICC placed 9/9 in R arm by Dr. 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 TNA on hold today due to lack of large vein access. New femoral catheter to be placed today as noted above. Watch for hypoglycemia today. 7. Left renal mass - follow conservatively for now with serial CTs 8. Gout - stable, Changed to po prednisone 5/day, would defer taper to his primary nephrologist Dr. Darrick Penna.  9. Thrombocytopenia - platelets trending back up 10. Disp - planning for d/c to SNF ?? When stable    [name], PA-C [facility] Beeper [phone] 06/04/2012,9:07 AM  LOS: 63 days   Patient seen and examined and agree with assessment and plan as above.  Vinson Moselle  MD [facility] [phone] pgr    [phone] cell 06/04/2012, 11:46 AM

## 2012-06-04 NOTE — Progress Notes (Signed)
Inpatient Diabetes Program Recommendations  AACE/ADA: New Consensus Statement on Inpatient Glycemic Control (2013)  Target Ranges:  Prepandial:   less than 140 mg/dL      Peak postprandial:   less than 180 mg/dL (1-2 hours)      Critically ill patients:  140 - 180 mg/dL  Results for [name], [name] (MRN [id number]) as of 06/04/2012 12:53  Ref. Range 06/03/2012 07:38 06/03/2012 16:35 06/03/2012 17:09 06/04/2012 00:32 06/04/2012 07:57  Glucose-Capillary Latest Range: 70-99 mg/dL 80 60 (L) 89 67 (L) 80    Patient continues to have HYPOglycemic episodes.  Regular insulin 15 units in TPN.  Does this need to remain?  Thank you  Piedad Climes [facility] Inpatient Diabetes Coordinator [phone]

## 2012-06-04 NOTE — Progress Notes (Signed)
Patient ID: [name], male   DOB: June 10, 1961, 51 y.o.   MRN: [id number]    Pt already on schedule for Tunneled Hickman catheter placement in IR today Request for RLQ abscess drain injection for evaluation of fistula  Consent for both procedures signed and in chart We will call for pt asap.

## 2012-06-04 NOTE — Discharge Summary (Signed)
Physician Discharge Summary  Patient ID: [name] MRN: [id number] DOB/AGE: 12-27-1960 51 y.o.  Admit date: 04/02/2012 Discharge date: 06/04/2012  Primary Care Physician:  Trevor Iha, MD   Discharge Diagnoses:    Principal Problem:  *SBO (small bowel obstruction) s/p EL/LOA and SBR x 2 Active Problems:  CKD (chronic kidney disease) stage V requiring chronic dialysis  Thrombocytopenia  Gout  HTN (hypertension)  Chronic use of steroids  Malnutrition, calorie  Leg graft occlusion  Acute respiratory failure with hypoxia  Sinus tachycardia  Abscess of abdominal cavity  Wound dehiscence  Bleeding  Anemia      Medication List     As of 06/04/2012  2:26 PM    STOP taking these medications         colchicine 0.6 MG tablet      TAKE these medications         fat emulsion 20 % infusion   Inject 120 mLs into the vein continuous.      feeding supplement (NEPRO CARB STEADY) Liqd   Take 237 mLs by mouth 3 (three) times daily between meals.      fentaNYL 12 MCG/HR   Commonly known as: DURAGESIC - dosed mcg/hr   Place 1 patch (12.5 mcg total) onto the skin every 3 (three) days.      lanthanum 1000 MG chewable tablet   Commonly known as: FOSRENOL   Chew 2,000 mg by mouth 3 (three) times daily with meals.      multivitamin Tabs tablet   Take 1 tablet by mouth daily.      pantoprazole 40 MG tablet   Commonly known as: PROTONIX   Take 1 tablet (40 mg total) by mouth daily at 12 noon.      predniSONE 5 MG tablet   Commonly known as: DELTASONE   Take 5 mg by mouth daily.       TPN   Disposition and Follow-up:  Will be discharged to SNF hopefully tomorrow. Will need follow up with surgery as scheduled by them  Consults:  {general surgery Dr. Luisa Hart   Significant Diagnostic Studies:  CT Scan Abd/Pelvis 7/13: IMPRESSION:  Dilated small bowel loops up to 3.3 cm with air-fluid levels.  Decompressed bowel loops distally. This may reflect an early small    bowel obstruction. Recommend correlation with symptoms and if no  intervention, follow-up KUB to document passage of contrast into  the colon.  Advanced atherosclerotic disease.  Transplant kidney left lower quadrant with prominent renal sinus  fat, similar to prior. Bilateral native kidneys with incompletely  characterized cystic lesions of varying complexity. Consider  follow-up renal MRI.  Dilated main pancreatic duct up to 5 mm. No obstructing lesion  visualized by CT. This can also be better characterized at the  time of MR.  Ct Scan Abd/Pelvis 8/8: IMPRESSION:  Interval postoperative changes. No residual bowel obstruction.  Multiple loculated fluid collections including a very large right  abdominal and pelvic collection consistent with abscesses.  Vicarious contrast excretion into the gallbladder. New left  pleural effusion and basilar atelectasis. Additional incidental  findings are stable since the previous study.  CT Scan A/P 8/13: IMPRESSION:  Large residual abscess in the right lower quadrant contains  extravasated oral contrast consistent with fistula to the small  bowel. There also is evidence of a cutaneous fistula from the  abscess cavity to the level of the anterior abdominal wound.  Indwelling percutaneous drain lies in the dependent portion of this  collection  which may contain debris obstructing the catheter.  Given the large residual collection present, further drain  manipulation including potential repositioning and upsizing of the  current drain and also potentially placement of a second more  anteriorly positioned drain may be helpful. This will be  communicated to the interventional Radiology service.  CT Scan A/P 9/7: IMPRESSION:  1. The patient's right lower quadrant abscess is now essentially  completely resolved. A drainage catheter remains in place within  the abscess cavity which is completely decompressed.  2. There is there is a small  filling defect associated with tip of  the right femoral central venous catheter, which could represent  catheter associated thrombus.  3. A 3.2 x 3.0 cm intermediate attenuation left renal lesion may  simply represent a proteinaceous cyst. However, this appears to  have slightly increased progressively over recent prior  examinations, and is clearly new compared to more remote prior CT  studies from 2006. Further evaluation with renal ultrasound is  recommended to exclude a cystic renal neoplasm.  4. Mild thickening and enhancement of the gallbladder wall. No  evidence of gallstones. This finding is nonspecific, but not  favored to represent acute cholecystitis.  5. Additional incidental findings, as above, similar to prior  examinations.  Brief H and P: For complete details please refer to admission H and P, but in brief this is a 51 year old male with past medical history of end-stage renal disease which dialyzes Monday Wednesdays and Fridays, who had a renal transplant about 17 years ago, prostatectomy in 2012, comes in for abdominal pain nausea or vomiting. He relates one day prior to admission he started having abdominal pain which progressively got worse it was mainly periumbilical nothing made it better or worse had no radiation. But in the middle of the night he took some TUMS and the pain did not improve. After this he started vomiting about 3 or 4 times. So he decided to come here to the emergency room. Here in the emergency room his pain was relieved by narcotics. A CT scan of the abdomen and pelvis was done that shows small bowel obstruction we were asked to admit and further evaluate.     Hospital Course:  Principal Problem:  *SBO (small bowel obstruction) s/p EL/LOA and SBR x 2 Active Problems:  CKD (chronic kidney disease) stage V requiring chronic dialysis  Thrombocytopenia  Gout  HTN (hypertension)  Chronic use of steroids  Malnutrition, calorie  Leg graft occlusion   Acute respiratory failure with hypoxia  Sinus tachycardia  Abscess of abdominal cavity  Wound dehiscence  Bleeding  Anemia  51 year old male with past medical history of ESRD (MWF), s/p renal transplant about 17 years ago, prostatectomy in 2012, admitted 04/02/2012 with abdominal pain, nausea and vomiting. Work up done indicated SBO and pt has required Ex Lap for lysis of adhesions. He subsequently had an anastomotic leak with an abscess which has necessitated an omental patching of the anastomosis and percutaneous drain placement. The patient had an elevation of his white blood cell count and CT of the abdomen revealed an enlarging abscess an enteric fistula was obstructed drainage tube necessitated repositioning of the percutaneous drain a percutaneous drain. Presently the patient has an open abdominal wound which is connected with the abscess cavity and is being managed by general surgery and wound ostomy care. Repeat CT abdomen as noted below indicated resolution of abscess and per IR attempt was made 05/31/2012 to try place central line from subclavian or jugular  vein but right upper extremity venogram demonstrated occlusion of right innominate vein. Therefore, a right arm midline catheter was placed with no immediate complication. This was needed in order to allow pt more mobility in order to proceed with discharge planning. However,this line is not adequate for feeding as it had to be left short given veins occlusions. A tunneled Hickman catheter has been placed in the groin to continue TPN until patient's nutritional status has improved. He is now completely off antibiotics and has been progressing well with PT. Plans are for DC to SNF to continue treatment for physical deconditioning.     Time spent on Discharge: Greater than 30 minutes.  SignedChaya Jan [facility] Pager: [phone] 06/04/2012, 2:26 PM

## 2012-06-04 NOTE — Clinical Social Work Note (Signed)
Patient will discharge to [facility] on Saturday, 06/05/12. Handoff prepared for weekend CSW. [facility] RN liaison contacted and was given D/C Summary. Patient's sister contacted and she is aware of discharge.  Genelle Bal, MSW, LCSW [phone]

## 2012-06-04 NOTE — Progress Notes (Signed)
Physical Therapy Treatment Patient Details Name: [name] MRN: [id number] DOB: [birth year] Today's Date: 06/04/2012 Time: 7425-9563 PT Time Calculation (min): 27 min  PT Assessment / Plan / Recommendation Comments on Treatment Session  Pt. continues to progress in therapy despite lengthy bedrest, though he needs ongoing rehab at SNF to increased independence and eventual return home.    Follow Up Recommendations  Skilled nursing facility;Supervision/Assistance - 24 hour    Barriers to Discharge        Equipment Recommendations  Defer to next venue    Recommendations for Other Services    Frequency Min 2X/week   Plan Discharge plan remains appropriate    Precautions / Restrictions Precautions Precautions: Fall Precaution Comments: triple lumen DC'd from right groin, awaiting femoral tunnel cath (permanent), abdominal wound, abdominal drain Required Braces or Orthoses: Other Brace/Splint Other Brace/Splint: abdominal binder Restrictions Weight Bearing Restrictions: No   Pertinent Vitals/Pain No distress, no dizziness, no pain    Mobility  Bed Mobility Bed Mobility: Supine to Sit Supine to Sit: 5: Supervision;HOB elevated;With rails Sitting - Scoot to Edge of Bed: 5: Supervision Sit to Supine: Not Tested (comment) Details for Bed Mobility Assistance: pt, managing self to EOB with supervision for safety Transfers Transfers: Sit to Stand;Stand to Sit Sit to Stand: From bed;4: Min assist;With upper extremity assist Stand to Sit: 4: Min assist;With armrests;To chair/3-in-1 Details for Transfer Assistance: vc's for technique and hand placement Ambulation/Gait Ambulation/Gait Assistance: 4: Min assist Ambulation Distance (Feet): 8 Feet (4 forward, 4 backward) Assistive device: Rolling walker Ambulation/Gait Assistance Details: Pt. able to remain upright with min assist for stability and safety. Gait Pattern: Step-through pattern;Decreased step length - right;Decreased  step length - left Gait velocity: slowed    Exercises General Exercises - Lower Extremity Ankle Circles/Pumps: AROM;Both;15 reps;Seated Long Arc Quad: AROM;Both;15 reps;Seated Hip Flexion/Marching: AROM;Both;15 reps;Seated   PT Diagnosis:    PT Problem List:   PT Treatment Interventions:     PT Goals Acute Rehab PT Goals Pt will go Supine/Side to Sit: with modified independence PT Goal: Supine/Side to Sit - Progress: Progressing toward goal PT Goal: Sit to Stand - Progress: Progressing toward goal PT Goal: Stand to Sit - Progress: Progressing toward goal PT Goal: Stand - Progress: Progressing toward goal PT Goal: Ambulate - Progress: Progressing toward goal  Visit Information  Last PT Received On: 06/04/12 Assistance Needed: +1    Subjective Data  Subjective: "I am feeling OK"  Denies dizziness throughout.   Cognition  Overall Cognitive Status: Appears within functional limits for tasks assessed/performed Arousal/Alertness: Awake/alert Orientation Level: Appears intact for tasks assessed Behavior During Session: [facility] for tasks performed    Balance  Static Standing Balance Static Standing - Balance Support: Bilateral upper extremity supported  End of Session PT - End of Session Equipment Utilized During Treatment: Gait belt Activity Tolerance: Patient tolerated treatment well;Patient limited by fatigue Patient left: in chair;with call bell/phone within reach Nurse Communication: Mobility status   GP     Ferman Hamming 06/04/2012, 9:43 AM Weldon Picking PT Acute Rehab Services [phone] Beeper [phone]

## 2012-06-04 NOTE — Procedures (Signed)
R groin Hickman Tip IVC RA No comp

## 2012-06-05 ENCOUNTER — Inpatient Hospital Stay (HOSPITAL_COMMUNITY): Payer: Medicare Other

## 2012-06-05 LAB — CBC
Hemoglobin: 13 g/dL (ref 13.0–17.0)
MCH: 27.6 pg (ref 26.0–34.0)
MCHC: 33.9 g/dL (ref 30.0–36.0)
MCV: 81.3 fL (ref 78.0–100.0)
RBC: 4.71 MIL/uL (ref 4.22–5.81)

## 2012-06-05 LAB — RENAL FUNCTION PANEL
BUN: 41 mg/dL — ABNORMAL HIGH (ref 6–23)
CO2: 24 mEq/L (ref 19–32)
Calcium: 9.8 mg/dL (ref 8.4–10.5)
Creatinine, Ser: 6.1 mg/dL — ABNORMAL HIGH (ref 0.50–1.35)
Glucose, Bld: 133 mg/dL — ABNORMAL HIGH (ref 70–99)
Phosphorus: 4.2 mg/dL (ref 2.3–4.6)
Sodium: 135 mEq/L (ref 135–145)

## 2012-06-05 MED ORDER — HEPARIN SOD (PORK) LOCK FLUSH 100 UNIT/ML IV SOLN
250.0000 [IU] | INTRAVENOUS | Status: AC | PRN
  Administered 2012-06-05: 250 [IU]

## 2012-06-05 NOTE — Progress Notes (Signed)
[id number] Pt d/c to [facility] via EMS; called report to Bland Span RN. Pt . Had his rt groin tube  intact and triple lumen cath capped for TPN at [facility]  per RN at [facility] that they will start TPN at the facility MD aware.

## 2012-06-05 NOTE — Discharge Summary (Signed)
Patient will be discharged to SNF today. Please see discharge summary dictated yesterday for further details. No new events since yesterday.  Peggye Pitt, MD [facility] Pager: [phone]

## 2012-06-05 NOTE — Progress Notes (Signed)
CSW was consulted to complete discharge of patient. Pt to transfer to [facility] today via [facility]. [facility] is aware of d/c. CSW contacted patient's sister to notify of transfer to SNF. D/C packet complete with chart copy, signed FL2, and signed hard Rx.  CSW signing off as no other CSW needs identified at this time.  Lia Foyer, LCSWA [facility] Clinical Social Worker Contact #: [phone] (weekend)

## 2012-06-05 NOTE — Progress Notes (Signed)
Subjective:  On Hd, No cos, going to [facility] today!!!!!!!! Objective Vital signs in last 24 hours: Filed Vitals:   06/05/12 0900 06/05/12 0930 06/05/12 1000 06/05/12 1030  BP: 122/84 110/77 111/55 108/77  Pulse: 90 93 99 103  Temp:      TempSrc:      Resp:      Height:      Weight:      SpO2:       Weight change: 0.35 kg (12.4 oz)  Intake/Output Summary (Last 24 hours) at 06/05/12 1108 Last data filed at 06/05/12 1047  Gross per 24 hour  Intake    240 ml  Output   1900 ml  Net  -1660 ml   Labs: Basic Metabolic Panel:  Lab 06/05/12 8295 06/04/12 0300 06/03/12 0600  NA 135 138 135  K 3.9 3.7 4.3  CL 96 98 95*  CO2 24 29 24   GLUCOSE 133* 111* 72  BUN 41* 18 56*  CREATININE 6.10* 3.43* 6.49*  CALCIUM 9.8 9.4 10.6*  ALB -- -- --  PHOS 4.2 2.2* 4.2   Liver Function Tests:  Lab 06/05/12 0722 06/04/12 0300 06/03/12 0600 05/31/12 0650  AST -- -- -- 37  ALT -- -- -- 34  ALKPHOS -- -- -- 173*  BILITOT -- -- -- 1.5*  PROT -- -- -- 5.4*  ALBUMIN 2.0* 2.0* 1.8* --   No results found for this basename: LIPASE:3,AMYLASE:3 in the last 168 hours No results found for this basename: AMMONIA:3 in the last 168 hours CBC:  Lab 06/05/12 0722 06/03/12 0600 06/02/12 0552 06/01/12 0430 05/31/12 0650  WBC 7.1 6.9 6.5 -- --  NEUTROABS -- -- -- -- 3.5  HGB 13.0 12.8* 12.7* -- --  HCT 38.3* 38.2* 37.4* -- --  MCV 81.3 81.8 81.7 81.9 83.4  PLT 161 134* 128* -- --   Cardiac Enzymes: No results found for this basename: CKTOTAL:5,CKMB:5,CKMBINDEX:5,TROPONINI:5 in the last 168 hours CBG:  Lab 06/04/12 1629 06/04/12 0757 06/04/12 0032 06/03/12 1709 06/03/12 1635  GLUCAP 129* 80 67* 89 60*    Iron Studies: No results found for this basename: IRON,TIBC,TRANSFERRIN,FERRITIN in the last 72 hours Studies/Results: Ir Sinus/fist Tube Chk-non Gi  06/04/2012  *RADIOLOGY REPORT*  Clinical Data: Pelvic abscess drain  SINUS TRACT INJECTION/FISTULOGRAM  Technique: Contrast was injected into the  pelvic abscess drain  Comparison:  05/10/2012  Findings: Contrast injection into the drain demonstrates that the abscess cavity has decompressed and has resolved.    Contrast does opacify small bowel loops consistent with small bowel fistula. Small bowel loops are seen filled with contrast in the pelvis.  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  Continued drainage is recommended.   Original Report Authenticated By: Donavan Burnet, M.D.    Ir Fluoro Guide Cv Line Right  06/04/2012  *RADIOLOGY REPORT*  Clinical Data/Indication: FAILURE TO THRIVE.  TPN ADMINISTRATION. SVC OCCLUSION.  IR RIGHT FLOURO GUIDE CV LINE,IR ULTRASOUND GUIDANCE VASC ACCESS RIGHT  Sedation: Versed 2.0 mg, Fentanyl 100 mg.  Total Moderate Sedation Time: 15 minutes.  Fluoroscopy Time: 1.8 minutes.  Procedure: The procedure, risks, benefits, and alternatives were explained to the patient. Questions regarding the procedure were encouraged and answered. The patient understands and consents to the procedure.  The right groin was prepped with betadine in a sterile fashion, and a sterile drape was applied covering the operative field. A sterile gown and sterile  gloves were used for the procedure.  Under sonographic guidance, a micropuncture needle was inserted into the right common femoral vein.  A needle tract measuring approximately 10 cm was deployed.  This was performed under direct sonographic visualization to avoid traversal of an arterial structure.  It was removed over a 018 wire which was upsized to a [place].  The 9-French peel-away sheath was inserted.  The Hickman catheter was cut to the measure length and advanced over the wire, through the sheath, and to the IVC right atrial junction.  The cuff was positioned in the subcutaneous tract. It was flushed and sewn to the skin with an O Prolene pursestring knot.  Findings: Image  demonstrates placement of a right common femoral vein tunneled Hickman catheter with its tip at the IVC right atrial junction. Sonographic documentation of ultrasound guidance was also performed.  Complications: None.  IMPRESSION: Successful right common femoral vein Hickman catheter placement.   Original Report Authenticated By: Donavan Burnet, M.D.    Ir US Guide Vasc Access Right  06/04/2012  *RADIOLOGY REPORT*  Clinical Data/Indication: FAILURE TO THRIVE.  TPN ADMINISTRATION. SVC OCCLUSION.  IR RIGHT FLOURO GUIDE CV LINE,IR ULTRASOUND GUIDANCE VASC ACCESS RIGHT  Sedation: Versed 2.0 mg, Fentanyl 100 mg.  Total Moderate Sedation Time: 15 minutes.  Fluoroscopy Time: 1.8 minutes.  Procedure: The procedure, risks, benefits, and alternatives were explained to the patient. Questions regarding the procedure were encouraged and answered. The patient understands and consents to the procedure.  The right groin was prepped with betadine in a sterile fashion, and a sterile drape was applied covering the operative field. A sterile gown and sterile gloves were used for the procedure.  Under sonographic guidance, a micropuncture needle was inserted into the right common femoral vein.  A needle tract measuring approximately 10 cm was deployed.  This was performed under direct sonographic visualization to avoid traversal of an arterial structure.  It was removed over a 018 wire which was upsized to a [place].  The 9-French peel-away sheath was inserted.  The Hickman catheter was cut to the measure length and advanced over the wire, through the sheath, and to the IVC right atrial junction.  The cuff was positioned in the subcutaneous tract. It was flushed and sewn to the skin with an O Prolene pursestring knot.  Findings: Image demonstrates placement of a right common femoral vein tunneled Hickman catheter with its tip at the IVC right atrial junction. Sonographic documentation of ultrasound guidance was also performed.   Complications: None.  IMPRESSION: Successful right common femoral vein Hickman catheter placement.   Original Report Authenticated By: Donavan Burnet, M.D.    Medications:    . fat emulsion 120 mL (06/04/12 1808)  . TPN (CLINIMIX) +/- additives 45 mL/hr at 06/04/12 1808  . DISCONTD: TPN (CLINIMIX) +/- additives        . antiseptic oral rinse  15 mL Mouth Rinse q12n4p  . feeding supplement (NEPRO CARB STEADY)  237 mL Oral TID BM  . feeding supplement  1 Container Oral Q24H  . fentaNYL  37.5 mcg Transdermal Q72H  . heparin  40 Units/kg Dialysis Once in dialysis  . influenza  inactive virus vaccine  0.5 mL Intramuscular Tomorrow-1000  . insulin aspart  0-15 Units Subcutaneous Q8H  . lip balm   Topical BID  . metoprolol tartrate  12.5 mg Oral BID  . pantoprazole  40 mg Oral Q1200  . predniSONE  5 mg Oral Q breakfast  . simethicone  80 mg Oral QID  . sodium chloride  10-40 mL Intracatheter Q12H   I  have reviewed scheduled and prn medications.  Physical Exam:  General: Alert, NAD, on hd Heart: RRR, no murmurs  Lungs: CTA bilaterally  Abdomen: Bs pos. Soft , nontender, surgical bandage dry /clean  Extremities: Dialysis Access: no pedal edema, right groin drain with brown dc in bag / left fem. avgg pos. Bruit    Assessment/Plan:  1. 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 Continued drainage is recommended." tolerating diet  DYS 3,still on TNA;  Tight Heparin with HD.Marland Kitchen 2. ESRD - HD on TTS @ [facility], K 3.9 today. 3. Anemia - Hgb 13.0  Aranesp on hold / no iron. 4. MBD/hypercalcemia - Ca 9.8 (11.8 corrected), phos yest. 2.2 and today 4.2 off binders, no Zemplar/ fu op  5. HTN/volume - Does not appear volume overloaded. IV BP meds d/c'd Metoprolol started  conservatively at 12.5 BID. Will see how BP does with HD and volume removal and titrate metoprolol accordingly. Goal EDW should probably be ~ 48 - 49 6. Malnutrition - on Full liquids and TNA, Alb 2.0 TODAY prealbumin 24.7 9/9). New R tunneled Hickman placed yesterday, back on TNA.  7. Left renal mass - follow conservatively for now with serial CTs 8. Gout - stable, Changed to po prednisone 5/day, would defer taper to his primary nephrologist Dr. Darrick Penna.  9. Thrombocytopenia - platelets trending back up 10. Disp - planning for d/c to SNF  today   [name], PA-C [facility] [phone] 06/05/2012,11:08 AM  LOS: 64 days   Patient seen and examined and agree with assessment and plan as above.  Vinson Moselle  MD [facility] [phone] pgr    [phone] cell 06/05/2012, 1:58 PM

## 2012-06-05 NOTE — Progress Notes (Signed)
39 Days Post-Op  Subjective: Coming off HD and scheduled for SNF today.  No complaints.  Objective: Vital signs in last 24 hours: Temp:  [97.3 F (36.3 C)-98.6 F (37 C)] 97.7 F (36.5 C) (09/14 1047) Pulse Rate:  [76-103] 97  (09/14 1047) Resp:  [17-22] 18  (09/14 1047) BP: (108-155)/(55-96) 135/96 mmHg (09/14 1047) SpO2:  [96 %-100 %] 99 % (09/14 1047) Weight:  [46.3 kg (102 lb 1.2 oz)-48.8 kg (107 lb 9.4 oz)] 48.8 kg (107 lb 9.4 oz) (09/14 0624) Last BM Date: 06/02/12  No output recorded yesterday, afebrile, VSS, WBC is normal, IR sinus injection, shows contrast injection into the is  cavity decompressed, but fistula to small bowel persist.  Intake/Output from previous day: 09/13 0701 - 09/14 0700 In: 240 [P.O.:240] Out: -  Intake/Output this shift: Total I/O In: 0  Out: 1900 [Other:1900]  General appearance: alert, cooperative and no distress GI: soft, non-tender; bowel sounds normal; no masses,  no organomegaly and wound healing well.  the drain is about the same.    Lab Results:   Basename 06/05/12 0722 06/03/12 0600  WBC 7.1 6.9  HGB 13.0 12.8*  HCT 38.3* 38.2*  PLT 161 134*    BMET  Basename 06/05/12 0722 06/04/12 0300  NA 135 138  K 3.9 3.7  CL 96 98  CO2 24 29  GLUCOSE 133* 111*  BUN 41* 18  CREATININE 6.10* 3.43*  CALCIUM 9.8 9.4   PT/INR  Basename 06/03/12 0600  LABPROT 15.3*  INR 1.18     Lab 06/05/12 0722 06/04/12 0300 06/03/12 0600 06/01/12 0430 05/31/12 0650  AST -- -- -- -- 37  ALT -- -- -- -- 34  ALKPHOS -- -- -- -- 173*  BILITOT -- -- -- -- 1.5*  PROT -- -- -- -- 5.4*  ALBUMIN 2.0* 2.0* 1.8* 1.6* 1.7*     Lipase  No results found for this basename: lipase     Studies/Results: Ir Sinus/fist Tube Chk-non Gi  06/04/2012  *RADIOLOGY REPORT*  Clinical Data: Pelvic abscess drain  SINUS TRACT INJECTION/FISTULOGRAM  Technique: Contrast was injected into the pelvic abscess drain  Comparison:  05/10/2012  Findings: Contrast injection  into the drain demonstrates that the abscess cavity has decompressed and has resolved.    Contrast does opacify small bowel loops consistent with small bowel fistula. Small bowel loops are seen filled with contrast in the pelvis.  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  Continued drainage is recommended.   Original Report Authenticated By: Donavan Burnet, M.D.    Ir Fluoro Guide Cv Line Right  06/04/2012  *RADIOLOGY REPORT*  Clinical Data/Indication: FAILURE TO THRIVE.  TPN ADMINISTRATION. SVC OCCLUSION.  IR RIGHT FLOURO GUIDE CV LINE,IR ULTRASOUND GUIDANCE VASC ACCESS RIGHT  Sedation: Versed 2.0 mg, Fentanyl 100 mg.  Total Moderate Sedation Time: 15 minutes.  Fluoroscopy Time: 1.8 minutes.  Procedure: The procedure, risks, benefits, and alternatives were explained to the patient. Questions regarding the procedure were encouraged and answered. The patient understands and consents to the procedure.  The right groin was prepped with betadine in a sterile fashion, and a sterile drape was applied covering the operative field. A sterile gown and sterile gloves were used for the procedure.  Under sonographic guidance, a micropuncture needle was inserted into the right common femoral vein.  A needle tract measuring approximately 10 cm was deployed.  This was performed under  direct sonographic visualization to avoid traversal of an arterial structure.  It was removed over a 018 wire which was upsized to a [place].  The 9-French peel-away sheath was inserted.  The Hickman catheter was cut to the measure length and advanced over the wire, through the sheath, and to the IVC right atrial junction.  The cuff was positioned in the subcutaneous tract. It was flushed and sewn to the skin with an O Prolene pursestring knot.  Findings: Image demonstrates placement of a right common femoral vein tunneled Hickman catheter with  its tip at the IVC right atrial junction. Sonographic documentation of ultrasound guidance was also performed.  Complications: None.  IMPRESSION: Successful right common femoral vein Hickman catheter placement.   Original Report Authenticated By: Donavan Burnet, M.D.    Ir US Guide Vasc Access Right  06/04/2012  *RADIOLOGY REPORT*  Clinical Data/Indication: FAILURE TO THRIVE.  TPN ADMINISTRATION. SVC OCCLUSION.  IR RIGHT FLOURO GUIDE CV LINE,IR ULTRASOUND GUIDANCE VASC ACCESS RIGHT  Sedation: Versed 2.0 mg, Fentanyl 100 mg.  Total Moderate Sedation Time: 15 minutes.  Fluoroscopy Time: 1.8 minutes.  Procedure: The procedure, risks, benefits, and alternatives were explained to the patient. Questions regarding the procedure were encouraged and answered. The patient understands and consents to the procedure.  The right groin was prepped with betadine in a sterile fashion, and a sterile drape was applied covering the operative field. A sterile gown and sterile gloves were used for the procedure.  Under sonographic guidance, a micropuncture needle was inserted into the right common femoral vein.  A needle tract measuring approximately 10 cm was deployed.  This was performed under direct sonographic visualization to avoid traversal of an arterial structure.  It was removed over a 018 wire which was upsized to a [place].  The 9-French peel-away sheath was inserted.  The Hickman catheter was cut to the measure length and advanced over the wire, through the sheath, and to the IVC right atrial junction.  The cuff was positioned in the subcutaneous tract. It was flushed and sewn to the skin with an O Prolene pursestring knot.  Findings: Image demonstrates placement of a right common femoral vein tunneled Hickman catheter with its tip at the IVC right atrial junction. Sonographic documentation of ultrasound guidance was also performed.  Complications: None.  IMPRESSION: Successful right common femoral vein Hickman catheter  placement.   Original Report Authenticated By: Donavan Burnet, M.D.     Medications:    . antiseptic oral rinse  15 mL Mouth Rinse q12n4p  . feeding supplement (NEPRO CARB STEADY)  237 mL Oral TID BM  . feeding supplement  1 Container Oral Q24H  . fentaNYL  37.5 mcg Transdermal Q72H  . heparin  40 Units/kg Dialysis Once in dialysis  . influenza  inactive virus vaccine  0.5 mL Intramuscular Tomorrow-1000  . insulin aspart  0-15 Units Subcutaneous Q8H  . lip balm   Topical BID  . metoprolol tartrate  12.5 mg Oral BID  . pantoprazole  40 mg Oral Q1200  . predniSONE  5 mg Oral Q breakfast  . simethicone  80 mg Oral QID  . sodium chloride  10-40 mL Intracatheter Q12H    Assessment/Plan Exploratory laparotomy with lysis of adhesions and small bowel resection x2, 04/14/2012, [place] Pu. 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 Exploratory laparotomy.  2. Lysis of adhesions.  3. Small bowel resection with side-to-side stapled anastomosis.  4. Omental patch of small bowel anastomosis. Ardeth Sportsman, MD, FACS.  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  SBO (small bowel obstruction) s/p EL/LOA and SBR x 2   .  CKD (chronic kidney disease) stage V requiring chronic dialysis Hx of renal transplant 17 years ago   .  Thrombocytopenia   .  Gout   .  HTN (hypertension)   .  Chronic use of steroids   .  Malnutrition, calorie   .  Leg graft occlusion   .  Acute respiratory failure with hypoxia   .  Sinus tachycardia   .  Abscess of abdominal cavity   .  Wound dehiscence   .  Bleeding   .  Anemia      Plan:  Going to SNF today, continue drain and TNA. From our standpoint follow up with DR. Cornett in 2 weeks. Info in AVS to obtain follow up.  Continue current local wound care to abdominal wall .       LOS: 64 days    [name] 06/05/2012

## 2012-06-18 ENCOUNTER — Encounter (INDEPENDENT_AMBULATORY_CARE_PROVIDER_SITE_OTHER): Payer: Self-pay | Admitting: Surgery

## 2012-06-18 ENCOUNTER — Ambulatory Visit (INDEPENDENT_AMBULATORY_CARE_PROVIDER_SITE_OTHER): Payer: Medicare Other | Admitting: Surgery

## 2012-06-18 VITALS — BP 120/70 | HR 70 | Temp 98.0°F | Resp 18 | Ht 72.0 in | Wt 100.0 lb

## 2012-06-18 DIAGNOSIS — E43 Unspecified severe protein-calorie malnutrition: Secondary | ICD-10-CM

## 2012-06-18 NOTE — Patient Instructions (Signed)
Return 6 months

## 2012-06-18 NOTE — Progress Notes (Signed)
Patient returns in followup after exploratory laparotomy 2 months ago with small bowel resection for small bowel obstruction. 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 He has severe protein calorie malnutrition and is on hemodialysis with multiple other medical problems. After a prolonged hospital stay, he was sent to a skilled nursing facility where he receives TNA and is on a diet. He is moving his bowels. He indicated physical therapy. He had a recent fistulogram which showed indication the catheter the small bowel indicated intracutaneous fistula. His albumin is 2.  Exam: Midline incision is clean and healed. He has a Hickman catheter in his right groin. Percutaneous drain is noted with minimal drainage.  Impression: Severe protein calorie malnutrition status post exploratory laparotomy with small bowel resection and reexploration for breakdown of staple line with enterocutaneous fistula with multiple medical problems  Plan: He is not a candidate for any reexploration or resection at this point. Medically stable and recommending continuing TNA and enteral support. He has a strong desire to live with a sister hopefully in about 6 weeks he will be strong enough to transition out of skilled nursing care to family. Return in 6 weeks for reassessment and we'll check a prealbumin level.

## 2012-06-23 ENCOUNTER — Encounter (INDEPENDENT_AMBULATORY_CARE_PROVIDER_SITE_OTHER): Payer: Self-pay | Admitting: General Surgery

## 2012-06-23 ENCOUNTER — Ambulatory Visit (INDEPENDENT_AMBULATORY_CARE_PROVIDER_SITE_OTHER): Payer: Medicare Other | Admitting: General Surgery

## 2012-06-23 VITALS — BP 124/80 | HR 80 | Temp 98.0°F | Resp 18 | Ht 72.0 in | Wt 102.4 lb

## 2012-06-23 DIAGNOSIS — Z09 Encounter for follow-up examination after completed treatment for conditions other than malignant neoplasm: Secondary | ICD-10-CM

## 2012-06-23 DIAGNOSIS — K632 Fistula of intestine: Secondary | ICD-10-CM

## 2012-06-23 NOTE — Addendum Note (Signed)
Addended byLittie Deeds on: 06/23/2012 02:45 PM   Modules accepted: Orders

## 2012-06-23 NOTE — Progress Notes (Signed)
Subjective:     Patient ID: [name], male   DOB: [birth year], 51 y.o.   MRN: [id number]  HPI 57 yom s/p exploratory laparotomy 2 months ago with small bowel resection for small bowel obstruction. 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 He has severe protein calorie malnutrition and is on hemodialysis with multiple other medical problems. After a prolonged hospital stay, he was sent to a skilled nursing facility where he receives TNA and is on a diet. He is moving his bowels. . He had a recent fistulogram which showed indication the catheter the small bowel indicated intracutaneous fistula. He returns today doing pretty well but his perc drain has pulled back and stitch is out.  It is still functional and he otherwise has no real changes or complaints.   Review of Systems     Objective:   Physical Exam Wound healing, rlq drain pulled back with serosang output    Assessment:     sb fistula    Plan:     I cleansed area and sutured drain  Into place with 2-0 nylon.  Can dress daily.  Will set up for repeat ct in 2 weeks and to see Dr Luisa Hart in about 3 weeks to follow up.

## 2012-06-28 ENCOUNTER — Ambulatory Visit
Admission: RE | Admit: 2012-06-28 | Discharge: 2012-06-28 | Disposition: A | Discharge status: Home / Self Care | Payer: Medicare Other | Source: Ambulatory Visit | Attending: General Surgery | Admitting: General Surgery

## 2012-06-28 DIAGNOSIS — Z09 Encounter for follow-up examination after completed treatment for conditions other than malignant neoplasm: Secondary | ICD-10-CM

## 2012-06-28 DIAGNOSIS — K632 Fistula of intestine: Secondary | ICD-10-CM

## 2012-07-01 ENCOUNTER — Inpatient Hospital Stay (HOSPITAL_COMMUNITY)
Admission: EM | Admit: 2012-07-01 | Discharge: 2012-07-05 | DRG: 314 | Disposition: A | Discharge status: Home Health | Payer: Medicare Other | Attending: Internal Medicine | Admitting: Internal Medicine

## 2012-07-01 ENCOUNTER — Encounter (HOSPITAL_COMMUNITY): Payer: Self-pay | Admitting: Emergency Medicine

## 2012-07-01 ENCOUNTER — Emergency Department (HOSPITAL_COMMUNITY): Payer: Medicare Other

## 2012-07-01 DIAGNOSIS — M1A00X1 Idiopathic chronic gout, unspecified site, with tophus (tophi): Secondary | ICD-10-CM | POA: Diagnosis present

## 2012-07-01 DIAGNOSIS — K56609 Unspecified intestinal obstruction, unspecified as to partial versus complete obstruction: Secondary | ICD-10-CM | POA: Diagnosis present

## 2012-07-01 DIAGNOSIS — T82898A Other specified complication of vascular prosthetic devices, implants and grafts, initial encounter: Secondary | ICD-10-CM

## 2012-07-01 DIAGNOSIS — B1920 Unspecified viral hepatitis C without hepatic coma: Secondary | ICD-10-CM | POA: Diagnosis present

## 2012-07-01 DIAGNOSIS — Z992 Dependence on renal dialysis: Secondary | ICD-10-CM | POA: Diagnosis present

## 2012-07-01 DIAGNOSIS — R7881 Bacteremia: Secondary | ICD-10-CM | POA: Diagnosis present

## 2012-07-01 DIAGNOSIS — R000 Tachycardia, unspecified: Secondary | ICD-10-CM

## 2012-07-01 DIAGNOSIS — Z7952 Long term (current) use of systemic steroids: Secondary | ICD-10-CM

## 2012-07-01 DIAGNOSIS — N186 End stage renal disease: Secondary | ICD-10-CM | POA: Diagnosis present

## 2012-07-01 DIAGNOSIS — D631 Anemia in chronic kidney disease: Secondary | ICD-10-CM | POA: Diagnosis present

## 2012-07-01 DIAGNOSIS — I509 Heart failure, unspecified: Secondary | ICD-10-CM | POA: Diagnosis present

## 2012-07-01 DIAGNOSIS — IMO0002 Reserved for concepts with insufficient information to code with codable children: Secondary | ICD-10-CM

## 2012-07-01 DIAGNOSIS — A419 Sepsis, unspecified organism: Secondary | ICD-10-CM | POA: Diagnosis present

## 2012-07-01 DIAGNOSIS — I498 Other specified cardiac arrhythmias: Secondary | ICD-10-CM | POA: Diagnosis present

## 2012-07-01 DIAGNOSIS — A411 Sepsis due to other specified staphylococcus: Secondary | ICD-10-CM | POA: Diagnosis present

## 2012-07-01 DIAGNOSIS — Z87891 Personal history of nicotine dependence: Secondary | ICD-10-CM

## 2012-07-01 DIAGNOSIS — N2581 Secondary hyperparathyroidism of renal origin: Secondary | ICD-10-CM | POA: Diagnosis present

## 2012-07-01 DIAGNOSIS — Z79899 Other long term (current) drug therapy: Secondary | ICD-10-CM

## 2012-07-01 DIAGNOSIS — K632 Fistula of intestine: Secondary | ICD-10-CM | POA: Diagnosis present

## 2012-07-01 DIAGNOSIS — E46 Unspecified protein-calorie malnutrition: Secondary | ICD-10-CM | POA: Diagnosis present

## 2012-07-01 DIAGNOSIS — I120 Hypertensive chronic kidney disease with stage 5 chronic kidney disease or end stage renal disease: Secondary | ICD-10-CM | POA: Diagnosis present

## 2012-07-01 DIAGNOSIS — I5022 Chronic systolic (congestive) heart failure: Secondary | ICD-10-CM | POA: Diagnosis present

## 2012-07-01 DIAGNOSIS — Y841 Kidney dialysis as the cause of abnormal reaction of the patient, or of later complication, without mention of misadventure at the time of the procedure: Secondary | ICD-10-CM | POA: Diagnosis present

## 2012-07-01 DIAGNOSIS — Z681 Body mass index (BMI) 19 or less, adult: Secondary | ICD-10-CM

## 2012-07-01 DIAGNOSIS — I10 Essential (primary) hypertension: Secondary | ICD-10-CM | POA: Diagnosis present

## 2012-07-01 DIAGNOSIS — R509 Fever, unspecified: Secondary | ICD-10-CM | POA: Diagnosis present

## 2012-07-01 DIAGNOSIS — M109 Gout, unspecified: Secondary | ICD-10-CM | POA: Diagnosis present

## 2012-07-01 DIAGNOSIS — D696 Thrombocytopenia, unspecified: Secondary | ICD-10-CM | POA: Diagnosis present

## 2012-07-01 DIAGNOSIS — T80218A Other infection due to central venous catheter, initial encounter: Principal | ICD-10-CM | POA: Diagnosis present

## 2012-07-01 DIAGNOSIS — Z8546 Personal history of malignant neoplasm of prostate: Secondary | ICD-10-CM

## 2012-07-01 DIAGNOSIS — N189 Chronic kidney disease, unspecified: Secondary | ICD-10-CM | POA: Diagnosis present

## 2012-07-01 HISTORY — DX: Chronic kidney disease, unspecified: N18.9

## 2012-07-01 HISTORY — DX: Unspecified viral hepatitis C without hepatic coma: B19.20

## 2012-07-01 HISTORY — DX: Reserved for concepts with insufficient information to code with codable children: IMO0002

## 2012-07-01 HISTORY — DX: Chronic systolic (congestive) heart failure: I50.22

## 2012-07-01 HISTORY — DX: Personal history of other diseases of the digestive system: Z87.19

## 2012-07-01 HISTORY — DX: Unspecified protein-calorie malnutrition: E46

## 2012-07-01 HISTORY — DX: Chronic gout, unspecified, with tophus (tophi): M1A.9XX1

## 2012-07-01 HISTORY — DX: Anemia in chronic kidney disease: D63.1

## 2012-07-01 HISTORY — DX: Essential (primary) hypertension: I10

## 2012-07-01 LAB — COMPREHENSIVE METABOLIC PANEL
Alkaline Phosphatase: 198 U/L — ABNORMAL HIGH (ref 39–117)
BUN: 41 mg/dL — ABNORMAL HIGH (ref 6–23)
CO2: 27 mEq/L (ref 19–32)
Chloride: 96 mEq/L (ref 96–112)
Creatinine, Ser: 5.75 mg/dL — ABNORMAL HIGH (ref 0.50–1.35)
GFR calc non Af Amer: 10 mL/min — ABNORMAL LOW (ref >90)
Potassium: 3.3 mEq/L — ABNORMAL LOW (ref 3.5–5.1)
Total Bilirubin: 1 mg/dL (ref 0.3–1.2)

## 2012-07-01 LAB — CBC WITH DIFFERENTIAL/PLATELET
Eosinophils Relative: 0 % (ref 0–5)
MCH: 26.9 pg (ref 26.0–34.0)
MCV: 80.6 fL (ref 78.0–100.0)
Platelets: 94 10*3/uL — ABNORMAL LOW (ref 150–400)
RDW: 16.5 % — ABNORMAL HIGH (ref 11.5–15.5)

## 2012-07-01 MED ORDER — BOOST / RESOURCE BREEZE PO LIQD
1.0000 | Freq: Three times a day (TID) | ORAL | Status: DC
  Administered 2012-07-04 (×2): 1 via ORAL

## 2012-07-01 MED ORDER — PIPERACILLIN-TAZOBACTAM IN DEX 2-0.25 GM/50ML IV SOLN
2.2500 g | Freq: Three times a day (TID) | INTRAVENOUS | Status: DC
  Administered 2012-07-01 – 2012-07-02 (×2): 2.25 g via INTRAVENOUS
  Filled 2012-07-01 (×4): qty 50

## 2012-07-01 MED ORDER — IOHEXOL 300 MG/ML  SOLN
20.0000 mL | INTRAMUSCULAR | Status: AC
  Administered 2012-07-01 (×2): 20 mL via ORAL

## 2012-07-01 MED ORDER — VANCOMYCIN HCL IN DEXTROSE 1-5 GM/200ML-% IV SOLN
1000.0000 mg | Freq: Once | INTRAVENOUS | Status: AC
  Administered 2012-07-01: 1000 mg via INTRAVENOUS
  Filled 2012-07-01: qty 200

## 2012-07-01 MED ORDER — SODIUM CHLORIDE 0.9 % IV BOLUS (SEPSIS)
1000.0000 mL | Freq: Once | INTRAVENOUS | Status: AC
  Administered 2012-07-01: 1000 mL via INTRAVENOUS

## 2012-07-01 MED ORDER — VANCOMYCIN HCL 500 MG IV SOLR
500.0000 mg | INTRAVENOUS | Status: DC
  Administered 2012-07-03: 500 mg via INTRAVENOUS
  Filled 2012-07-01 (×2): qty 500

## 2012-07-01 MED ORDER — SODIUM CHLORIDE 0.9 % IV SOLN
100.0000 mg | Freq: Every day | INTRAVENOUS | Status: DC
  Administered 2012-07-01 – 2012-07-02 (×2): 100 mg via INTRAVENOUS
  Filled 2012-07-01 (×2): qty 100

## 2012-07-01 MED ORDER — RENA-VITE PO TABS
1.0000 | ORAL_TABLET | Freq: Every day | ORAL | Status: DC
  Administered 2012-07-02 – 2012-07-03 (×2): 1 via ORAL
  Filled 2012-07-01 (×2): qty 1

## 2012-07-01 MED ORDER — PIPERACILLIN-TAZOBACTAM 3.375 G IVPB 30 MIN
3.3750 g | Freq: Three times a day (TID) | INTRAVENOUS | Status: DC
  Administered 2012-07-01: 3.375 g via INTRAVENOUS
  Filled 2012-07-01: qty 50

## 2012-07-01 MED ORDER — OXYCODONE-ACETAMINOPHEN 5-325 MG PO TABS
1.0000 | ORAL_TABLET | Freq: Four times a day (QID) | ORAL | Status: DC | PRN
  Administered 2012-07-03 – 2012-07-04 (×2): 1 via ORAL
  Filled 2012-07-01: qty 1

## 2012-07-01 MED ORDER — CALCIUM ACETATE 667 MG PO CAPS
667.0000 mg | ORAL_CAPSULE | Freq: Three times a day (TID) | ORAL | Status: DC
  Administered 2012-07-01 – 2012-07-05 (×9): 667 mg via ORAL
  Filled 2012-07-01 (×14): qty 1

## 2012-07-01 MED ORDER — PANTOPRAZOLE SODIUM 40 MG PO TBEC
40.0000 mg | DELAYED_RELEASE_TABLET | Freq: Every day | ORAL | Status: DC
  Administered 2012-07-02 – 2012-07-05 (×4): 40 mg via ORAL
  Filled 2012-07-01 (×4): qty 1

## 2012-07-01 MED ORDER — PREDNISONE 5 MG PO TABS
5.0000 mg | ORAL_TABLET | Freq: Every day | ORAL | Status: DC
  Administered 2012-07-02 – 2012-07-05 (×4): 5 mg via ORAL
  Filled 2012-07-01 (×5): qty 1

## 2012-07-01 MED ORDER — IBUPROFEN 400 MG PO TABS
600.0000 mg | ORAL_TABLET | Freq: Once | ORAL | Status: AC
  Administered 2012-07-01: 600 mg via ORAL
  Filled 2012-07-01: qty 2

## 2012-07-01 MED ORDER — SODIUM CHLORIDE 0.9 % IJ SOLN
3.0000 mL | Freq: Two times a day (BID) | INTRAMUSCULAR | Status: DC
  Administered 2012-07-01 – 2012-07-05 (×6): 3 mL via INTRAVENOUS

## 2012-07-01 MED ORDER — PIPERACILLIN-TAZOBACTAM 3.375 G IVPB 30 MIN: 3.3750 g | Freq: Once | INTRAVENOUS | Status: DC

## 2012-07-01 MED ORDER — HEPARIN SODIUM (PORCINE) 5000 UNIT/ML IJ SOLN
5000.0000 [IU] | Freq: Three times a day (TID) | INTRAMUSCULAR | Status: DC
  Administered 2012-07-01 – 2012-07-05 (×12): 5000 [IU] via SUBCUTANEOUS
  Filled 2012-07-01 (×17): qty 1

## 2012-07-01 NOTE — ED Notes (Signed)
Pt presents to ED c/o fever, pt has midline lower abd incision that is closed & approximated with no drainage present, pt has closed drainage system draining sanguinous fluid in collection bag this is baseline for the pt

## 2012-07-01 NOTE — H&P (Signed)
Hospital Admission Note Date: 07/01/2012  Patient name: [name] Medical record number: [id number] Date of birth: 23-Nov-1960 Age: 51 y.o. 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 Patient was undergoing HD today when he was noted to have fever and tachycardia . HD was not completed and he was transferred to the ED. He reports that he was fine when he went to the HD center today but within one hour of HD he noticed chills and not feeling well at all. He further reported of a non-productive cough since few weeks but denies any fever or chills at that time. He does not make any urine. He denies any headache, neck stiffness, SOB, abdominal pain, nausea or vomiting, diarrhea or constipation.   He lives in a SNF after he was d/c from the in 04/2012.   Of noted: patient  s/p exploratory laparotomy  August 2013  with small bowel resection for small bowel obstruction. 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 Is followed up by surgery as an outpatient has seen Dr Dwain Sarna on 06/23/12. fistulogram which showed indication the catheter the small bowel indicated intracutaneous fistula. Patient is moving bowls. Had Ct abdomen on 06/28/12 which showed No residual fluid collection in region of right lower quadrant percutaneous  drainage catheter. Small amount of free fluid persists in Morison's pouch. 2. Resolution of bilateral pleural effusions. 3. Stable small left adrenal mass and left renal complex cystic lesion.  In Ed patient received NS 1000 cc bolus x1    Meds: Current Facility-Administered Medications on File Prior to Encounter  Medication Dose Route Frequency Provider Last Rate Last Dose  . fentaNYL (SUBLIMAZE) injection   Intravenous PRN Abundio Miu, MD   25 mcg at 05/05/12 1544  . midazolam (VERSED) 5 MG/5ML injection   Intravenous PRN Abundio Miu, MD   2 mg at 05/05/12 1544   Current Outpatient Prescriptions on File Prior to Encounter  Medication Sig Dispense Refill  . fat emulsion 20 % infusion Inject 120 mLs into the vein continuous.  250 mL    . fentaNYL (DURAGESIC - DOSED MCG/HR) 12 MCG/HR Place 1 patch (12.5 mcg total) onto the skin every 3 (three) days.  5 patch  0  . pantoprazole (PROTONIX) 40 MG tablet Take 1 tablet (40 mg total) by mouth daily at 12 noon.      . predniSONE (DELTASONE) 5 MG tablet Take 5 mg by mouth daily.        Allergies: Allergies as of 07/01/2012 - Review Complete 07/01/2012  Allergen Reaction Noted  . Allopurinol  02/27/2010  . Aspirin  02/27/2010   Past Medical History  Diagnosis Date  . ESRD needing dialysis   . Prostate cancer   . Hypertension   .  Renal transplant disorder 1998    Rejected  . Gout tophi   . Chronic steroid use     For renal transplant  . Systolic CHF, chronic     2 D echo 04/2012 with EF of 45 %   . Malnutrition   . 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 F/U with Dr Dwain Sarna. Started on TNA  . Anemia associated with chronic renal failure    Past Surgical History  Procedure Date  . Laparotomy 04/14/2012    Procedure: EXPLORATORY LAPAROTOMY;  Surgeon: Clovis Pu. Cornett, MD;  Location: [facility] OR;  Service: General;  Laterality: N/A;  .  Colon resection 04/14/2012  . Laparotomy 04/22/2012    Procedure: EXPLORATORY LAPAROTOMY;  Surgeon: Ardeth Sportsman, MD;  Location: [facility] OR;  Service: General;  Laterality: N/A;  lysis of adhesions, omentoplasty, repair small bowel  . Thrombectomy w/ embolectomy 04/27/2012    Procedure: THROMBECTOMY ARTERIOVENOUS GORE-TEX GRAFT;  Surgeon: Chuck Hint, MD;  Location: [facility] OR;  Service: Vascular;  Laterality: Left;  Thrombectomy of left thigh arteriovenous gortex graft  . Prostectomy 2011   Family History  Problem Relation Age of Onset  . Hypertension Father   . Pneumonia Brother   . Lung disease Brother    History   Social History  . Marital Status: Single    Spouse Name: N/A    Number of Children: N/A  . Years of Education: N/A   Occupational History  . Not on file.   Social History Main Topics  . Smoking status: Former Games developer  . Smokeless tobacco: Not on file   Comment: Quit 30 years ago  . Alcohol Use: No  . Drug Use: No  . Sexually Active: No   Other Topics Concern  . Not on file   Social History Narrative   Currently in the nursing home until end of the month then will go to live with sister.     Review of Systems: Bold if positive Constitutional:fever, chills, diaphoresis, appetite change and fatigue.  HEENT:   congestion, sore throat, rhinorrhea, sneezing, mouth sores, trouble swallowing,  Respiratory:  SOB, DOE, cough, chest tightness,  and wheezing.   Cardiovascular: chest pain, palpitations and leg swelling.  Gastrointestinal: nausea, vomiting, abdominal pain, diarrhea, constipation, blood in stool and abdominal distention.  Genitourinary:  dysuria, urgency, frequency, hematuria, flank pain and difficulty urinating.  Skin:  pallor, rash and wound.  Neurological:  dizziness,  syncope, weakness,and headaches.  Hematological:  adenopathy. Easy bruising,    Physical Exam: Blood pressure 136/69, pulse 91, temperature 97.7 F (36.5 C), temperature source Oral,  resp. rate 16, height 6' (1.829 m), weight 105 lb 2.6 oz (47.7 kg), SpO2 100.00%. Constitutional: Vital signs reviewed.  Patient is a very cachectic appearing male   in no acute distress and cooperative with exam. Alert and oriented x3.  Mouth: no erythema or exudates, MMM Eyes: PERRL, EOMI, conjunctivae injected.  Neck: Supple,  No  mass,  Cardiovascular: tachycardia but regular, S1 normal, S2 normal,  Pulmonary/Chest: CTAB, no wheezes, rales, or rhonchi Abdominal: Soft. Non-tender, non-distended, bowel sounds are normal. Midline scar presented. No erythema or tenderness noted.  GU: no CVA tenderness Musculoskeletal: tophi present on all joints present. Non-tender. Hematology: no cervical,adenopathy.  Neurological: A&O x3, Strenght is normal and symmetric bilaterally, cranial nerve II-XII are grossly intact, no focal motor deficit, sensory intact to light touch bilaterally.  Skin:  Warm, dry and intact. No rash. AV fistula with no bruit bilateral arms present  Lines:  1. Drain in RLQ draining bile 2. Picc line in place in the  Right groin 3. Left groin: HD cath in place   Lab results: Basic Metabolic Panel:  [facility] 07/01/12 0842  NA 140  K 3.3*  CL 96  CO2 27  GLUCOSE 74  BUN 41*  CREATININE 5.75*  CALCIUM 10.1  MG --  PHOS --   Liver Function Tests:  [facility] 07/01/12 0842  AST 43*  ALT 35  ALKPHOS 198*  BILITOT 1.0  PROT 6.8  ALBUMIN 2.7*    CBC:  Basename 07/01/12 0842  WBC 8.8  NEUTROABS 7.6  HGB 12.6*  HCT 37.7*  MCV 80.6  PLT 94*    Imaging results:  Dg Chest 2 View  07/01/2012  *RADIOLOGY REPORT*  Clinical Data: Weakness.  Fever.  Dialysis patient.  CHEST - 2 VIEW  Comparison: 04/28/2012.  Findings: Catheter projects over the cardiopericardial silhouette inferior approach.  The tip of the catheter is in the right atrium. There is no airspace disease or effusion.  Cardiopericardial silhouette is within normal limits.  Aortic arch atherosclerosis. No  pulmonary edema or volume overload. On the lateral view, there appears to be a left anterior descending coronary artery stent.  IMPRESSION: No acute cardiopulmonary disease.  Inferior approach catheter in the right atrium.   Original Report Authenticated By: Andreas Newport, M.D.      Assessment & Plan by Problem:  [name] is a 59 yo is a cachetic patient with a h/o of steroid dependent ESRD on HD and HTN, s/p exploratory laparotomy 2 months ago for SBO. who is admitted for fever of unknown origin.   # Fever - most likely secondary due to  Infected HD catheter considering patient started to have fevers and chills while during HD today and he has been fine prior to that. Patient has further risk of infection considering multiple lines including RLQ drain and Picc line. The drain was evaluated by Surgery who does not think that this is a source of infection considering patient has a normal CT abdomen on 10/7 and referred to  Picc line as a possible source.  Given his TPN administration, he is at an increased risk for catheter-related candidemia. Other possible etiologies in this patient are pneumonia considering patient history of cough but CXRAy was negative for any acute process. Patient is anuric .  Other DD include  early clostridium difficile infection (fever, chronic antibiotic therapy- Keflex ) vs. pyocystitis vs. Hepatitis C) vs surgical complications (although no signs of wound infection, dehiscence).  - Will admit to tele - Obtained blood culturesx2  - Initated Vanc+Zosyn+Mycofungin - Consulted Surgery - Consider to consult ID  # Malnutrition - Currently receiving TNA. Albumin is 2.7 on admission and baseline prealbumin is ~ 25. Of note, < 30 mg/dl indicative of malnutrition while on HD.  --F/u prealbumin levels.  --Continue TPN.   # Unilateral LE swelling - Patient has had left lower limb edema since August. Has risk factors for PVD including history of smoking, HTN and age.  Dopplers in August revealed no evidence of DVTs. 2D Echo in August 2013 showed EF of 40-45% with moderately reduced systolic function.Low suspicion for DVT.  --Ankle brachial index.   # Thrombocytopenia - The patient presents with a platelet count of 94 likely in the setting of ESRD on HD an Hep C. Baseline ranges from 120-161. No prior  blood smears available.  -Will continue to monitor CBC and for extrahematological symptoms.   # Sinus tachycardia - Has been documented in the past, but sporadic. Most likely 2/2 to fever / underlying infection. Other causes can include hyperthyroidism vs hypovolemia.  --F/u TSH, orthostatics   #ESRD - HD was interrupted today.  - per Nephrology    # Gout - Currently stable, says that it improved after HD was started. Patient has allergy to Allopurinol  - f/u on uric acid level  # Anemia - likely 2/2 anemia of chronic inflammation. Iron panel in July 2013 shows low iron and UIBC and elevated ferritin.  --Continue to monitor H/H.  #Hepatitis C : Hepatis B  and HIV  Negative  04/2012  - f/u on AFP Tumor marker   #DVT: Heparin   Signed: Abimael Zeiter 07/01/2012, 9:11 PM

## 2012-07-01 NOTE — Progress Notes (Signed)
PARENTERAL NUTRITION CONSULT NOTE - INITIAL  Pharmacy Consult for TPN Indication: Enterocutaneous Fistula  Allergies  Allergen Reactions  . Allopurinol     REACTION: decreased platelets  . Aspirin     REACTION: unspecified    Patient Measurements: Height: 6' (182.9 cm) IBW/kg (Calculated) : 77.6  Adjusted Body Weight: 48kg Usual Weight: 48kg  Vital Signs: Temp: 97.7 F (36.5 C) (10/10 1615) Temp src: Oral (10/10 1540) BP: 136/69 mmHg (10/10 1622) Pulse Rate: 91  (10/10 1622) Intake/Output from previous day:   Intake/Output from this shift:    Labs:  [facility] 07/01/12 0842  WBC 8.8  HGB 12.6*  HCT 37.7*  PLT 94*  APTT --  INR --     Basename 07/01/12 0842  NA 140  K 3.3*  CL 96  CO2 27  GLUCOSE 74  BUN 41*  CREATININE 5.75*  LABCREA --  CREAT24HRUR --  CALCIUM 10.1  MG --  PHOS --  PROT 6.8  ALBUMIN 2.7*  AST 43*  ALT 35  ALKPHOS 198*  BILITOT 1.0  BILIDIR --  IBILI --  PREALBUMIN --  TRIG --  CHOLHDL --  CHOL --   The CrCl is unknown because both a height and weight (above a minimum accepted value) are required for this calculation.   No results found for this basename: GLUCAP:3 in the last 72 hours  Medical History: Past Medical History  Diagnosis Date  . ESRD needing dialysis   . Prostate cancer   . Hypertension   . Renal transplant disorder 1998    Rejected  . Gout tophi   . Chronic steroid use   . Systolic CHF, chronic     2 D echo 2013 with EF of 45 %  . Malnutrition   . Hx SBO 04/2012  . Anemia associated with chronic renal failure     Medications:  Scheduled:    . calcium acetate  667 mg Oral TID WC  . feeding supplement  1 Container Oral TID BM  . heparin  5,000 Units Subcutaneous Q8H  . ibuprofen  600 mg Oral Once  . iohexol  20 mL Oral Q1 Hr x 2  . micafungin (MYCAMINE) IV  100 mg Intravenous Daily  . multivitamin  1 tablet Oral Daily  . pantoprazole  40 mg Oral Q1200  . piperacillin-tazobactam (ZOSYN)  IV   2.25 g Intravenous Q8H  . piperacillin-tazobactam  3.375 g Intravenous Once  . predniSONE  5 mg Oral Daily  . sodium chloride  1,000 mL Intravenous Once  . sodium chloride  3 mL Intravenous Q12H  . vancomycin  500 mg Intravenous Q T,Th,Sa-HD  . vancomycin  1,000 mg Intravenous Once  . DISCONTD: piperacillin-tazobactam  3.375 g Intravenous Q8H    Assessment: 51yo male with ESRD-TTS HD on chronic TPN for ECF as well as taking PO and eating a regular diet, admitted today with probable line infection 2nd PICC line.  He receives continuous TPN which is turned off when he goes to HD and therapy.  He was admitted from outpatient HD today.   A calorie count has been ordered with a goal of being able to discontinue the TPN.  His TPNs are compounded by [facility].  He receives plain TPN on TueThurSatSun and TPN with lipids, MVI, & trace elements on MonWedFri, reflecting known shortages.  [facility] should be faxing his formula to [place] tonight.  We are unable to provide TPN tonight as the cut off for new  orders is noon; this has been explained to the Mr. Hale Bogus.  Plan:  1.  CMet, Mg, Phos, Palb in AM 2.  F/U TPN formula 3.  TPN will start 07/02/12  Marisue Humble, PharmD Clinical Pharmacist [facility]- [facility]

## 2012-07-01 NOTE — ED Provider Notes (Signed)
History     CSN: [id number]  Arrival date & time 07/01/12  0827   First MD Initiated Contact with Patient 07/01/12 [phone]      Chief Complaint  Patient presents with  . Fever    (Consider location/radiation/quality/duration/timing/severity/associated sxs/prior treatment) Patient is a 51 y.o. male presenting with fever. The history is provided by the patient.  Fever Primary symptoms of the febrile illness include fever, fatigue and abdominal pain. Primary symptoms do not include visual change, headaches, cough, shortness of breath, nausea, vomiting, diarrhea, altered mental status, myalgias, arthralgias or rash. The current episode started today. This is a new problem. The problem has not changed since onset. The fever began today. The fever has been unchanged since its onset. The maximum temperature recorded prior to his arrival was 102 to 102.9 F. The temperature was taken by an oral thermometer.  The fatigue began today. The fatigue has been unchanged since its onset. Exacerbated by: fever.  Onset: for several months. The abdominal pain has been unchanged since its onset. The abdominal pain is generalized. The abdominal pain does not radiate. The severity of the abdominal pain is 2/10.  The onset of the illness is associated with a foreign body (has a right side PERC drain, dialysis cath in left leg, and feedings on right leg). Risk factors for febrile illness include implanted device.   Past Medical History  Diagnosis Date  . Dialysis patient   . Prostate cancer   . Renal insufficiency     Past Surgical History  Procedure Date  . Laparotomy 04/14/2012    Procedure: EXPLORATORY LAPAROTOMY;  Surgeon: Clovis Pu. Cornett, MD;  Location: [facility] OR;  Service: General;  Laterality: N/A;  . Colon resection 04/14/2012  . Laparotomy 04/22/2012    Procedure: EXPLORATORY LAPAROTOMY;  Surgeon: Ardeth Sportsman, MD;  Location: [facility] OR;  Service: General;  Laterality: N/A;  lysis of adhesions,  omentoplasty, repair small bowel  . Thrombectomy w/ embolectomy 04/27/2012    Procedure: THROMBECTOMY ARTERIOVENOUS GORE-TEX GRAFT;  Surgeon: Chuck Hint, MD;  Location: [facility] OR;  Service: Vascular;  Laterality: Left;  Thrombectomy of left thigh arteriovenous gortex graft    Family History  Problem Relation Age of Onset  . Hypertension Father   . Pneumonia Brother   . Lung disease Brother     History  Substance Use Topics  . Smoking status: Never Smoker   . Smokeless tobacco: Not on file  . Alcohol Use: No      Review of Systems  Constitutional: Positive for fever and fatigue.  Respiratory: Negative for cough and shortness of breath.   Cardiovascular: Negative for chest pain.  Gastrointestinal: Positive for abdominal pain. Negative for nausea, vomiting and diarrhea.  Musculoskeletal: Negative for myalgias and arthralgias.  Skin: Negative for rash.  Neurological: Negative for headaches.  Psychiatric/Behavioral: Negative for altered mental status.  All other systems reviewed and are negative.    Allergies  Allopurinol and Aspirin  Home Medications   Current Outpatient Rx  Name Route Sig Dispense Refill  . FAT EMULSION 20 % IV EMUL Intravenous Inject 120 mLs into the vein continuous. 250 mL   . FENTANYL 12 MCG/HR TD PT72 Transdermal Place 1 patch (12.5 mcg total) onto the skin every 3 (three) days. 5 patch 0  . LANTHANUM CARBONATE 1000 MG PO CHEW Oral Chew 2,000 mg by mouth 3 (three) times daily with meals.    Marland Kitchen RENA-VITE PO TABS Oral Take 1 tablet by mouth daily.    Marland Kitchen  NEPRO/CARB STEADY PO LIQD Oral Take 237 mLs by mouth 3 (three) times daily between meals.    Marland Kitchen PANTOPRAZOLE SODIUM 40 MG PO TBEC Oral Take 1 tablet (40 mg total) by mouth daily at 12 noon.    Marland Kitchen PREDNISONE 5 MG PO TABS Oral Take 5 mg by mouth daily.      BP 138/81  Pulse 133  Temp 102.6 F (39.2 C) (Oral)  Resp 15  SpO2 95%  Physical Exam  Nursing note and vitals reviewed. Constitutional: He  is oriented to person, place, and time. He appears well-developed and well-nourished. No distress.  HENT:  Head: Normocephalic and atraumatic.  Eyes: Pupils are equal, round, and reactive to light.  Cardiovascular: Normal rate and normal heart sounds.   Pulmonary/Chest: Effort normal and breath sounds normal. No respiratory distress.  Abdominal: Soft. He exhibits no distension. There is no tenderness.  Musculoskeletal: Normal range of motion.  Neurological: He is alert and oriented to person, place, and time.  Skin: Skin is warm and dry.  Psychiatric: He has a normal mood and affect.    ED Course  Procedures (including critical care time)  Labs Reviewed  CBC WITH DIFFERENTIAL - Abnormal; Notable for the following:    Hemoglobin 12.6 (*)     HCT 37.7 (*)     RDW 16.5 (*)     Platelets 94 (*)  PLATELET COUNT CONFIRMED BY SMEAR   Neutrophils Relative 87 (*)     Lymphocytes Relative 8 (*)     All other components within normal limits  COMPREHENSIVE METABOLIC PANEL - Abnormal; Notable for the following:    Potassium 3.3 (*)     BUN 41 (*)     Creatinine, Ser 5.75 (*)     Albumin 2.7 (*)     AST 43 (*)     Alkaline Phosphatase 198 (*)     GFR calc non Af Amer 10 (*)     GFR calc Af Amer 12 (*)     All other components within normal limits  LACTIC ACID, PLASMA  CULTURE, BLOOD (ROUTINE X 2)  CULTURE, BLOOD (ROUTINE X 2)  URINE CULTURE   No results found.  Date: 07/01/2012  Rate: 133  Rhythm: sinus tachycardia  QRS Axis: normal  Intervals: normal  ST/T Wave abnormalities: normal  Conduction Disutrbances:none  Narrative Interpretation:   Old EKG Reviewed: changes noted: previous rate was sinus tach at 120    1. Fever   2. CKD (chronic kidney disease) stage V requiring chronic dialysis       MDM  8:29 AM Pt seen and examined. Pt found out today at dialysis that he had a fever. Pt denies any complaints at this time. He denies increased abdominal pain or increased  drainage from his PERC drain. He denies cough. He only makes a very small amount of urine every couple of days, so doubt UTI. Pt denies headache or neck pain at current. Concern for fever of unknown origin. As patient still does have intra-abdominal fluid collection, will get ct scan. Will also send labs including lactic acid and blood cultures.  Pt started on broad spectrum antibiotics as he was recently in hospital. Vanc/Zosyn given for fever of unknown source.  1:12 PM No acute findings on labs. Pt refused CT scan and denies change in abdominal pain since last scan was done several days ago. Will admit to inpatient.        Daleen Bo, MD 07/01/12 1524

## 2012-07-01 NOTE — Progress Notes (Signed)
ANTIBIOTIC CONSULT NOTE - INITIAL  Pharmacy Consult for Vancomycin, Zosyn Indication: Probable line infection  Allergies  Allergen Reactions  . Allopurinol     REACTION: decreased platelets  . Aspirin     REACTION: unspecified    Patient Measurements: Height: 6' (182.9 cm) IBW/kg (Calculated) : 77.6  Adjusted Body Weight: 48kg  Vital Signs: Temp: 97.7 F (36.5 C) (10/10 1615) Temp src: Oral (10/10 1540) BP: 136/69 mmHg (10/10 1622) Pulse Rate: 91  (10/10 1622) Intake/Output from previous day:   Intake/Output from this shift:    Labs:  [facility] 07/01/12 0842  WBC 8.8  HGB 12.6*  PLT 94*  LABCREA --  CREATININE 5.75*   The CrCl is unknown because both a height and weight (above a minimum accepted value) are required for this calculation. No results found for this basename: VANCOTROUGH:2,VANCOPEAK:2,VANCORANDOM:2,GENTTROUGH:2,GENTPEAK:2,GENTRANDOM:2,TOBRATROUGH:2,TOBRAPEAK:2,TOBRARND:2,AMIKACINPEAK:2,AMIKACINTROU:2,AMIKACIN:2, in the last 72 hours   Microbiology: No results found for this or any previous visit (from the past 720 hour(s)).  Medical History: Past Medical History  Diagnosis Date  . ESRD needing dialysis   . Prostate cancer   . Hypertension   . Renal transplant disorder 1998    Rejected  . Gout tophi   . Chronic steroid use   . Systolic CHF, chronic     2 D echo 2013 with EF of 45 %  . Malnutrition   . Hx SBO 04/2012  . Anemia associated with chronic renal failure     Medications:  Scheduled:    . calcium acetate  667 mg Oral TID WC  . feeding supplement  1 Container Oral TID BM  . heparin  5,000 Units Subcutaneous Q8H  . ibuprofen  600 mg Oral Once  . iohexol  20 mL Oral Q1 Hr x 2  . micafungin (MYCAMINE) IV  100 mg Intravenous Daily  . multivitamin  1 tablet Oral Daily  . pantoprazole  40 mg Oral Q1200  . piperacillin-tazobactam  3.375 g Intravenous Once  . predniSONE  5 mg Oral Daily  . sodium chloride  1,000 mL Intravenous Once    . sodium chloride  3 mL Intravenous Q12H  . vancomycin  1,000 mg Intravenous Once  . DISCONTD: piperacillin-tazobactam  3.375 g Intravenous Q8H   Assessment: [name] with ESRD-TTS HD and PICC line for home TPN, presenting with fever and probable line infection.  Pt is on chronic TPN 2nd ECF and CT abd on 10/7 was (-) for signs of infection.  He received Vancomycin 1000mg  today at noon and Zosyn 3.375gm at 11AM.  Goal of Therapy:  Pre-HD level 15-25  Plan:  1.  Continue Zosyn 2.25mg  IV q8- next 7PM. 2.  Vancomcyin 500mg  IV qHD, next Saturday. 3.  Redose Vancomycin tomorrow if he has an extra HD session due to today's session ending early.  Marisue Humble, PharmD Clinical Pharmacist [facility]

## 2012-07-01 NOTE — ED Notes (Signed)
To ED from dialysis via EMS, fever onset this AM, had a partial dialysis treatment and then was sent to ED for eval, gave given 650 of Tylenol pta, no pain, no CP, no SOB, NAD

## 2012-07-01 NOTE — Consult Note (Signed)
[name] 07/01/2012 Kage Willmann D Requesting Physician:  Dr. Loistine Chance  Reason for Consult:  Need for dialysis and related services in patient with fevers and chills at HD today  HPI: The patient is a 51 y.o. year-old AAM w hx of gout, HTN and ESRD. Had transplant from '87 to 2004, HD-dependent since '04. Was admitted for  2months from July to September for SBO requiring LOA which was c/b anastomotic dehiscence, peritonitis and pelvic abcess with enterocutaneous fistula. He was d/c'd to SNF on 9/14 with a R femoral vein PICC for TNA, and an indwelling RLQ pelvic drain. He has been seen by Gen Surg in clinic a couple times per patient since discharge.   Today at dialysis, pt developed severe chills, rigors and high fevers. HD was stopped after 1 hour, blood cultures sent. No abx were given, he was brought by EMS to ER. He feels fine now (hours later). He was admitted and started on broad spec abx.   He had outpt abd CT done on 10/7 which showed a fully drained fluid collection where the drain is, no new fluid collections.   ROS  denies cp, sob , cough  denies abd pain  eating solid food at SNF, no diarrhea   voids minimally  no jt pain or rash  no neuro c/o's  Past Medical History:  Past Medical History  Diagnosis Date  . ESRD needing dialysis   . Prostate cancer   . Hypertension   . Renal transplant disorder 1998    Rejected  . Gout tophi   . Chronic steroid use   . Systolic CHF, chronic     2 D echo 2013 with EF of 45 %  . Malnutrition   . Hx SBO 04/2012  . Anemia associated with chronic renal failure     Past Surgical History:  Past Surgical History  Procedure Date  . Laparotomy 04/14/2012    Procedure: EXPLORATORY LAPAROTOMY;  Surgeon: Clovis Pu. Cornett, MD;  Location: [facility] OR;  Service: General;  Laterality: N/A;  . Colon resection 04/14/2012  . Laparotomy 04/22/2012    Procedure: EXPLORATORY LAPAROTOMY;  Surgeon: Ardeth Sportsman, MD;  Location: [facility] OR;  Service: General;   Laterality: N/A;  lysis of adhesions, omentoplasty, repair small bowel  . Thrombectomy w/ embolectomy 04/27/2012    Procedure: THROMBECTOMY ARTERIOVENOUS GORE-TEX GRAFT;  Surgeon: Chuck Hint, MD;  Location: [facility] OR;  Service: Vascular;  Laterality: Left;  Thrombectomy of left thigh arteriovenous gortex graft  . Prostectomy 2011    Family History:  Family History  Problem Relation Age of Onset  . Hypertension Father   . Pneumonia Brother   . Lung disease Brother    Social History:  reports that he has quit smoking. He does not have any smokeless tobacco history on file. He reports that he does not drink alcohol or use illicit drugs.  Allergies:  Allergies  Allergen Reactions  . Allopurinol     REACTION: decreased platelets  . Aspirin     REACTION: unspecified    Home medications: Prior to Admission medications   Medication Sig Start Date End Date Taking? Authorizing Provider  calcium acetate (PHOSLO) 667 MG capsule Take 667 mg by mouth 3 (three) times daily with meals.   Yes Historical Provider, MD  cephALEXin (KEFLEX) 500 MG capsule Take 500 mg by mouth 2 (two) times daily. For 10 days starting on 06/30/12   Yes Historical Provider, MD  fat emulsion 20 % infusion Inject 120 mLs into  the vein continuous. 06/04/12  Yes Estela Isaiah Blakes, MD  feeding supplement (RESOURCE BREEZE) LIQD Take 1 Container by mouth 3 (three) times daily between meals.   Yes Historical Provider, MD  fentaNYL (DURAGESIC - DOSED MCG/HR) 12 MCG/HR Place 1 patch (12.5 mcg total) onto the skin every 3 (three) days. 06/04/12 07/04/12 Yes Estela Isaiah Blakes, MD  multivitamin (RENA-VIT) TABS tablet Take 1 tablet by mouth daily.   Yes Historical Provider, MD  oxyCODONE-acetaminophen (PERCOCET/ROXICET) 5-325 MG per tablet Take 1 tablet by mouth every 6 (six) hours as needed. For pain   Yes Historical Provider, MD  pantoprazole (PROTONIX) 40 MG tablet Take 1 tablet (40 mg total) by mouth daily at 12  noon. 06/04/12 06/04/13 Yes Estela Isaiah Blakes, MD  predniSONE (DELTASONE) 5 MG tablet Take 5 mg by mouth daily.   Yes Historical Provider, MD  PRESCRIPTION MEDICATION 4 (four) times a week. On Tues, Thurs, Sat and Sun - TPN: Clinimix 5/15, fat emulsion   Yes Historical Provider, MD  TPN ADULT Inject into the vein 3 (three) times a week. On Monday Wednesday and Friday - Fat emulsion 1200 ml, Dextrose 158 gm, protein 54 gm, 36 meq NA, 10 meq Potassium, 62 meq CL, 47 meq ack, mmol Phos daily and lipids 24 gm   Yes Historical Provider, MD    Inpatient medications:    . ibuprofen  600 mg Oral Once  . iohexol  20 mL Oral Q1 Hr x 2  . piperacillin-tazobactam  3.375 g Intravenous Once  . sodium chloride  1,000 mL Intravenous Once  . vancomycin  1,000 mg Intravenous Once  . DISCONTD: piperacillin-tazobactam  3.375 g Intravenous Q8H     Labs: Basic Metabolic Panel:  Lab 07/01/12 2440  NA 140  K 3.3*  CL 96  CO2 27  GLUCOSE 74  BUN 41*  CREATININE 5.75*  ALB --  CALCIUM 10.1  PHOS --   Liver Function Tests:  Lab 07/01/12 0842  AST 43*  ALT 35  ALKPHOS 198*  BILITOT 1.0  PROT 6.8  ALBUMIN 2.7*   No results found for this basename: LIPASE:3,AMYLASE:3 in the last 168 hours No results found for this basename: AMMONIA:3 in the last 168 hours CBC:  Lab 07/01/12 0842  WBC 8.8  NEUTROABS 7.6  HGB 12.6*  HCT 37.7*  MCV 80.6  PLT 94*   PT/INR: @labrcntip (inr:5) Cardiac Enzymes: No results found for this basename: CKTOTAL:5,CKMB:5,CKMBINDEX:5,TROPONINI:5 in the last 168 hours CBG: No results found for this basename: GLUCAP:5 in the last 168 hours  Iron Studies: No results found for this basename: IRON:30,TIBC:30,TRANSFERRIN:30,FERRITIN:30 in the last 168 hours  Xrays/Other Studies: Dg Chest 2 View  07/01/2012  *RADIOLOGY REPORT*  Clinical Data: Weakness.  Fever.  Dialysis patient.  CHEST - 2 VIEW  Comparison: 04/28/2012.  Findings: Catheter projects over the  cardiopericardial silhouette inferior approach.  The tip of the catheter is in the right atrium. There is no airspace disease or effusion.  Cardiopericardial silhouette is within normal limits.  Aortic arch atherosclerosis. No pulmonary edema or volume overload. On the lateral view, there appears to be a left anterior descending coronary artery stent.  IMPRESSION: No acute cardiopulmonary disease.  Inferior approach catheter in the right atrium.   Original Report Authenticated By: Andreas Newport, M.D.     Physical Exam:  Blood pressure 117/52, pulse 83, temperature 98.9 F (37.2 C), temperature source Oral, resp. rate 18, SpO2 97.00%.  Gen: alert, no distress, thin AAM Skin:  no rash, cyanosis HEENT:  EOMI, sclera anicteric, throat moist Neck: no JVD, no bruits or LAN Chest: clear bilat, dec'd air movement throughout Heart: regular, no rub or gallop, soft SEM Abdomen: soft, midline wound is well healed, scaphoid, nontender. RLQ drain exit site is dry and without drainage; fluid in leg bag is dark brown, bilious material Ext: no LE edema, muscle wasting a little better. R femoral PICC exit site is minimally erythematous and without drainage. L thigh AVGG without signs of infection. Neuro: alert, Ox3, no focal deficit, no asterixis  Outpatient HD: [facility] TTS, 3.5 hours. EDW 44kg. L thigh AVGG. No EPO, no Zemplar , no IV iron. (Hb 12). Heparin 1500u. Bath 2K/2.0Ca. Last PTH 238, phos 4.2, Ca 9.9.    Impression/Plan 1. Fevers, chills- suspect bacteremia related to line infection (R fem PICC for TNA). Admitted and started on antimicrobials and antifungals IV empirically. The L thigh AV graft does not look infected, the midline abd wound is well healed and a recent CT on 10/7 showed no fluid collections in abdomen. 2. CKD, cont hd TTS. Had shortened session today. Consider short HD Friday, or just wait until next HD Sat. No vol excess 3. SBO s/p exlap wSB resection (04/14/12) and went back to OR (8/1)  for dehisced bowel, did more SB resection and omental patch repair to dehisced area. 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  4. HTPH- cont binders 5. Chronic prednisone- pred  5/d 6. Hx gout 7. Hx HTN 8. Hx prior renal transplant ('87 - 2004) 9. Nutrition- patient wants to get off of TNA so he can be discharged from the SNF to home.  Alb is 2.7.  Not sure what the end point for stopping TNA is- discussed with Gen Surg and they recommended getting calorie counts, which I will order.    Vinson Moselle  MD [employer] [phone] pgr    [phone] cell 07/01/2012, 3:47 PM

## 2012-07-01 NOTE — ED Notes (Signed)
Pt undressed, in gown, on monitor, continuous pulse oximetry, blood pressure cuff and oxygen [place] (2L) 

## 2012-07-01 NOTE — ED Notes (Signed)
Unable to do in and cath to much resistance.  RN noted and will also notified provider.

## 2012-07-01 NOTE — ED Provider Notes (Signed)
I saw and evaluated the patient, reviewed the resident's note and I agree with the findings and plan.   Dwyane Dupree, MD 07/01/12 1530 

## 2012-07-01 NOTE — H&P (Signed)
[facility] Admission Note Date: 07/01/2012  Patient name: [name] Medical record number: [id number] Date of birth: [birth year] Age: 51 y.o. Gender: male Nephrologist PCP: Trevor Iha, MD  Medical Service:  Attending physician:      Chief Complaint: fever  History of Present Illness: [name] is a 35 year old on a w/ a history of steroid dependent ESRD s/p transplant and currently on HD, malnutrition, HTN, gout, s/p exploratory laparotomy in August 2013 for SBO who presents with spiking temperatures to 102F since this morning. The patient was in his normal state of health when he was noticed to have a temperature of 102 during hemodialysis. He said at this time he felt chills and felt "jittery". These symptoms started this morning during dialysis. He endorses a mild, occasional cough that is non productive. He denies any sick contacts (that he knows of) or any changes in medications. He received Tylenol in the ED and is currently afebrile. He says that his rehabilitation has been going well in the nursing home. He denies lightheadedness, dizziness, shortness of breath, chest pain, palpitations, abdominal tenderness, changes in bowel habits, flank pain, bladder discomfort, joint pain, or new skin lesions / areas of erythema. He does not produce urine d/t his ESRD on HD.  Of noted: patient s/p exploratory laparotomy August 2013 with small bowel resection for small bowel obstruction. 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 Is followed up by surgery as an outpatient has seen Dr Dwain Sarna on 06/23/12. fistulogram which showed indication the catheter the small bowel indicated intracutaneous fistula. Patient is moving bowls. Had Ct abdomen on 06/28/12 which showed No residual fluid collection in region of right lower quadrant percutaneous drainage catheter. Small amount of free fluid persists in Morison's pouch. 2.  Resolution of bilateral pleural effusions. 3. Stable small left adrenal mass and left renal complex cystic lesion.  In Ed patient received NS 1000 cc bolus x1   Meds: Current Facility-Administered Medications on File Prior to Encounter  Medication Dose Route Frequency Provider Last Rate Last Dose  . fentaNYL (SUBLIMAZE) injection   Intravenous PRN Abundio Miu, MD   25 mcg at 05/05/12 1544  . midazolam (VERSED) 5 MG/5ML injection   Intravenous PRN Abundio Miu, MD   2 mg at 05/05/12 1544   Current Outpatient Prescriptions on File Prior to Encounter  Medication Sig Dispense Refill  . fat emulsion 20 % infusion Inject 120 mLs into the vein continuous.  250 mL    . fentaNYL (DURAGESIC - DOSED MCG/HR) 12 MCG/HR Place 1 patch (12.5 mcg total) onto the skin every 3 (three) days.  5 patch  0  . pantoprazole (PROTONIX) 40 MG tablet Take 1 tablet (40 mg total) by mouth daily at 12 noon.      . predniSONE (DELTASONE) 5 MG tablet Take 5 mg by mouth daily.         Allergies: Allergies as of 07/01/2012 - Review Complete 07/01/2012  Allergen Reaction Noted  . Allopurinol  02/27/2010  . Aspirin  02/27/2010   Past Medical History  Diagnosis Date  . ESRD needing dialysis   . Prostate cancer   . Hypertension   . Renal transplant disorder 1998    Rejected  . Gout tophi   . Chronic steroid use     For renal transplant  . Systolic CHF, chronic     2 D echo 04/2012 with EF of 45 %   .  Malnutrition   . 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 F/U with Dr Dwain Sarna. Started on TNA  . Anemia associated with chronic renal failure   . Hepatitis C    Past Surgical History  Procedure Date  . Laparotomy 04/14/2012    Procedure: EXPLORATORY LAPAROTOMY;  Surgeon: Clovis Pu. Cornett, MD;  Location: [facility] OR;  Service: General;  Laterality: N/A;  . Colon resection 04/14/2012  . Laparotomy 04/22/2012     Procedure: EXPLORATORY LAPAROTOMY;  Surgeon: Ardeth Sportsman, MD;  Location: [facility] OR;  Service: General;  Laterality: N/A;  lysis of adhesions, omentoplasty, repair small bowel  . Thrombectomy w/ embolectomy 04/27/2012    Procedure: THROMBECTOMY ARTERIOVENOUS GORE-TEX GRAFT;  Surgeon: Chuck Hint, MD;  Location: [facility];  Service: Vascular;  Laterality: Left;  Thrombectomy of left thigh arteriovenous gortex graft  . Prostectomy 2011   Family History  Problem Relation Age of Onset  . Hypertension Father   . Pneumonia Brother   . Lung disease Brother    History   Social History  . Marital Status: Single    Spouse Name: N/A    Number of Children: N/A  . Years of Education: N/A   Occupational History  . Not on file.   Social History Main Topics  . Smoking status: Former Games developer  . Smokeless tobacco: Not on file   Comment: Quit 30 years ago  . Alcohol Use: No  . Drug Use: No  . Sexually Active: No   Other Topics Concern  . Not on file   Social History Narrative   Currently in the nursing home until end of the month then will go to live with sister.     Review of Systems: Pertinent items are noted in HPI.  Physical Exam: Blood pressure 136/69, pulse 91, temperature 97.7 F (36.5 C), temperature source Oral, resp. rate 16, height 6' (1.829 m), weight 105 lb 2.6 oz (47.7 kg), SpO2 100.00%. General appearance: alert, cachectic and no distress Eyes: conjunctivae/corneas clear. PERRL, EOM's intact. Fundi benign. Throat: lips, mucosa, and tongue normal; teeth and gums normal and no thrush  Neck: no adenopathy, no carotid bruit, no JVD and supple, symmetrical, trachea midline Lungs: clear to auscultation bilaterally Heart: regular rate and rhythm, S1, S2 normal, no murmur, click, rub or gallop Abdomen: normal findings: soft, non-tender Extremities: left foot swelling, no tenderness or pitting edema, palpable knotting veins on left; right leg normal, no edema or swelling.  Evidence of gouty tophi on MCP of hands Pulses: right DP, PT +2, left DP and PT barely palpable Skin: Skin color, texture, turgor normal. No rashes or lesions Neurologic: Alert and oriented X 3, normal strength and tone. Normal symmetric reflexes. Normal coordination and gait Cranial nerves: normal Access sites: percutaneous drain draining serosanguinous fluid, PICC line, right femeoral vein hemodialysis catheter  Lab results: @labtest @  Imaging results:  Dg Chest 2 View  07/01/2012  *RADIOLOGY REPORT*  Clinical Data: Weakness.  Fever.  Dialysis patient.  CHEST - 2 VIEW  Comparison: 04/28/2012.  Findings: Catheter projects over the cardiopericardial silhouette inferior approach.  The tip of the catheter is in the right atrium. There is no airspace disease or effusion.  Cardiopericardial silhouette is within normal limits.  Aortic arch atherosclerosis. No pulmonary edema or volume overload. On the lateral view, there appears to be a left anterior descending coronary artery stent.  IMPRESSION: No acute cardiopulmonary  disease.  Inferior approach catheter in the right atrium.   Original Report Authenticated By: Andreas Newport, M.D.     Other results: EKG: sinus tachycardia, left atrial abnormality  Assessment & Plan by Problem:  [name] is a 6 yo is a cachetic patient with a h/o of steroid dependent ESRD on HD and HTN, s/p exploratory laparotomy 2 months ago for SBO. who is admitted for fever of unknown origin.  1) Fever - The patient's fever is most likely 2/2 to bacteremia d/t catheter related infection. Given his TPN administration, he is at an increased risk for catheter-related candidemia. Other possible etiologies in this patient are early clostridium difficile infection (fever, chronic antibiotic therapy) vs. pyocystitis vs pyelonephritis vs. Hepatitis C (reactive HCV ab in August) vs surgical complications (although no signs of wound infection, dehiscence). Follow up with HCV  viral load in outpatient setting, however will check AFP as rudimentary screen for HCC (sensitivity and specificity varies by study conducted). CXR revealed no acute cardiopulmonary disease. Patient is currently anuric so unable to get urine cultures at this time.  - Will admit to tele  - Obtained blood culturesx2  - Initated Vanc+Zosyn+Mycofungin  - Consulted Surgery . Appreciate consult. - Consider to consult ID --Vanc + zosyn + mycofungin  2) Malnutrition - Currently receiving TNA. Albumin is 2.7 on admission and baseline prealbumin is ~ 25. Of note, < 30 mg/dl indicative of malnutrition while on HD. --F/u prealbumin levels. --Continue TPN.  3) Unilateral LE swelling - Patient has had left lower limb edema since August. Has risk factors for PVD including history of smoking, HTN and age. Dopplers in August revealed no evidence of DVTs. 2D Echo in August 2013 showed EF of 40-45% with moderately reduced systolic function. Edema 2/2 to CHF would most likely be b/l. --Ankle brachial index. Low suspicion for DVT.  4) Thrombocytopenia - The patient presents with a platelet count of 94. Baseline ranges from 120-161. Possible etiologies for thrombocytopenia in this patient include pseudothrombocytopenia vs. Infection (HCV) vs. TTP vs ITP.  --Will continue to monitor CBC and for extrahematological symptoms. If continues to decline, can order blood smear to confirm. No prior blood smears available.  5) Sinus tachycardia - Has been documented in the past, but sporadic. Most likely 2/2 to fever / underlying infection. Other causes can include hyperthyroidism vs hypervolemia. --F/u TSH, orthostatics  6) ESRD - HD was interrupted today. Nephrology was consulted. Will help manage, appreciate their consult.  7) Gout - Currently stable, says that it improved after HD was started.  8) Anemia - likely 2/2 anemia of chronic inflammation. Iron panel in July 2013 shows low iron and UIBC and elevated  ferritin. --Continue to monitor H/H.  #Hepatitis C : Hepatis B and HIV Negative 04/2012  - f/u on AFP Tumor marker   #DVT: Heparin   Wynelle Link, MS4   This is a Psychologist, occupational Note.  The care of the patient was discussed with Dr. Loistine Chance and the assessment and plan was formulated with their assistance.  Please see their note for official documentation of the patient encounter.   I have reviewed and agree with Medical student Wynelle Link. Please see my separate note .  SignedAlmyra Deforest 07/01/2012, 9:27 PM

## 2012-07-01 NOTE — Consult Note (Signed)
Reason for Consult:fevers in postop patient Referring Physician: [name] is an 51 y.o. male.  HPI: This is a post operative patient well known to the service.  He is s/p a small bowel resection for SBO and re-operation for anastomotic leak.  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  He has one remaining drain in place.  He is on a diet and TPN both.  He developed fevers over the last 24 h.  He denies nausea or vomiting.  His abd pain is at baseline.    Past Medical History  Diagnosis Date  . ESRD needing dialysis   . Prostate cancer   . Hypertension   . Renal transplant disorder 1998    Rejected  . Gout tophi   . Chronic steroid use   . Systolic CHF, chronic     2 D echo 2013 with EF of 45 %  . Malnutrition   . Hx SBO 04/2012  . Anemia associated with chronic renal failure     Past Surgical History  Procedure Date  . Laparotomy 04/14/2012    Procedure: EXPLORATORY LAPAROTOMY;  Surgeon: Clovis Pu. Cornett, MD;  Location: [facility] OR;  Service: General;  Laterality: N/A;  . Colon resection 04/14/2012  . Laparotomy 04/22/2012    Procedure: EXPLORATORY LAPAROTOMY;  Surgeon: Ardeth Sportsman, MD;  Location: [facility] OR;  Service: General;  Laterality: N/A;  lysis of adhesions, omentoplasty, repair small bowel  . Thrombectomy w/ embolectomy 04/27/2012    Procedure: THROMBECTOMY ARTERIOVENOUS GORE-TEX GRAFT;  Surgeon: Chuck Hint, MD;  Location: [facility] OR;  Service: Vascular;  Laterality: Left;  Thrombectomy of left thigh arteriovenous gortex graft  . Prostectomy 2011    Family History  Problem Relation Age of Onset  . Hypertension Father   . Pneumonia Brother   . Lung disease Brother     Social History:  reports that he has quit smoking. He does not have any smokeless tobacco history on file. He reports that he does not drink alcohol or use illicit drugs.  Allergies:  Allergies  Allergen Reactions  . Allopurinol     REACTION: decreased platelets  . Aspirin      REACTION: unspecified    Medications: I have reviewed the patient's current medications.  Results for orders placed during the hospital encounter of 07/01/12 (from the past 48 hour(s))  LACTIC ACID, PLASMA     Status: Normal   Collection Time   07/01/12  8:41 AM      Component Value Range Comment   Lactic Acid, Venous 2.0  0.5 - 2.2 mmol/L   CBC WITH DIFFERENTIAL     Status: Abnormal   Collection Time   07/01/12  8:42 AM      Component Value Range Comment   WBC 8.8  4.0 - 10.5 K/uL    RBC 4.68  4.22 - 5.81 MIL/uL    Hemoglobin 12.6 (*) 13.0 - 17.0 g/dL    HCT 16.1 (*) 09.6 - 52.0 %    MCV 80.6  78.0 - 100.0 fL    MCH 26.9  26.0 - 34.0 pg    MCHC 33.4  30.0 - 36.0 g/dL    RDW 04.5 (*) 40.9 - 15.5 %    Platelets 94 (*) 150 - 400 K/uL PLATELET COUNT CONFIRMED BY SMEAR   Neutrophils Relative 87 (*) 43 - 77 %    Neutro Abs 7.6  1.7 - 7.7 K/uL    Lymphocytes Relative 8 (*) 12 -  46 %    Lymphs Abs 0.7  0.7 - 4.0 K/uL    Monocytes Relative 5  3 - 12 %    Monocytes Absolute 0.4  0.1 - 1.0 K/uL    Eosinophils Relative 0  0 - 5 %    Eosinophils Absolute 0.0  0.0 - 0.7 K/uL    Basophils Relative 0  0 - 1 %    Basophils Absolute 0.0  0.0 - 0.1 K/uL   COMPREHENSIVE METABOLIC PANEL     Status: Abnormal   Collection Time   07/01/12  8:42 AM      Component Value Range Comment   Sodium 140  135 - 145 mEq/L    Potassium 3.3 (*) 3.5 - 5.1 mEq/L    Chloride 96  96 - 112 mEq/L    CO2 27  19 - 32 mEq/L    Glucose, Bld 74  70 - 99 mg/dL    BUN 41 (*) 6 - 23 mg/dL    Creatinine, Ser 1.61 (*) 0.50 - 1.35 mg/dL    Calcium 09.6  8.4 - 10.5 mg/dL    Total Protein 6.8  6.0 - 8.3 g/dL    Albumin 2.7 (*) 3.5 - 5.2 g/dL    AST 43 (*) 0 - 37 U/L    ALT 35  0 - 53 U/L    Alkaline Phosphatase 198 (*) 39 - 117 U/L    Total Bilirubin 1.0  0.3 - 1.2 mg/dL    GFR calc non Af Amer 10 (*) >90 mL/min    GFR calc Af Amer 12 (*) >90 mL/min     Dg Chest 2 View  07/01/2012  *RADIOLOGY REPORT*   Clinical Data: Weakness.  Fever.  Dialysis patient.  CHEST - 2 VIEW  Comparison: 04/28/2012.  Findings: Catheter projects over the cardiopericardial silhouette inferior approach.  The tip of the catheter is in the right atrium. There is no airspace disease or effusion.  Cardiopericardial silhouette is within normal limits.  Aortic arch atherosclerosis. No pulmonary edema or volume overload. On the lateral view, there appears to be a left anterior descending coronary artery stent.  IMPRESSION: No acute cardiopulmonary disease.  Inferior approach catheter in the right atrium.   Original Report Authenticated By: Andreas Newport, M.D.     Review of Systems  Constitutional: Positive for fever and chills. Negative for weight loss.  HENT: Negative for congestion.   Respiratory: Negative for cough, sputum production and shortness of breath.   Cardiovascular: Negative for chest pain, palpitations and leg swelling.  Gastrointestinal: Positive for abdominal pain. Negative for heartburn, nausea and vomiting.  Musculoskeletal: Negative for joint pain.  Skin: Negative for rash.  Neurological: Positive for weakness. Negative for dizziness and headaches.   Blood pressure 117/52, pulse 83, temperature 98.9 F (37.2 C), temperature source Oral, resp. rate 18, SpO2 97.00%. Physical Exam  Constitutional: He is oriented to person, place, and time. He appears cachectic. He has a sickly appearance.  Eyes: Conjunctivae normal are normal. Pupils are equal, round, and reactive to light. No scleral icterus.  Neck: Normal range of motion.  Cardiovascular: Normal rate, regular rhythm and normal heart sounds.   Respiratory: Effort normal and breath sounds normal. He has no wheezes.  GI: Soft. Bowel sounds are normal. He exhibits no distension. There is no tenderness.    Neurological: He is alert and oriented to person, place, and time.  Skin: Skin is warm and dry.    Assessment/Plan: Fevers- given recent CT 3  days  ago, which showed no active infection, I don't think the fevers are coming from an abdominal source.  He most likely has a PICC line infection.  Await blood culture results, may need to d/c line Calorie counts to see if he still needs nutritional support  Amberia Bayless C. 07/01/2012, 3:54 PM

## 2012-07-01 NOTE — ED Notes (Signed)
Notified Resident Lara Mulch.  I was unable to do in and out cath.

## 2012-07-02 ENCOUNTER — Encounter (HOSPITAL_COMMUNITY): Payer: Self-pay | Admitting: *Deleted

## 2012-07-02 DIAGNOSIS — R509 Fever, unspecified: Secondary | ICD-10-CM

## 2012-07-02 DIAGNOSIS — M7989 Other specified soft tissue disorders: Secondary | ICD-10-CM

## 2012-07-02 DIAGNOSIS — I498 Other specified cardiac arrhythmias: Secondary | ICD-10-CM

## 2012-07-02 DIAGNOSIS — E46 Unspecified protein-calorie malnutrition: Secondary | ICD-10-CM

## 2012-07-02 DIAGNOSIS — D696 Thrombocytopenia, unspecified: Secondary | ICD-10-CM

## 2012-07-02 LAB — COMPREHENSIVE METABOLIC PANEL
ALT: 32 U/L (ref 0–53)
AST: 47 U/L — ABNORMAL HIGH (ref 0–37)
Albumin: 2.3 g/dL — ABNORMAL LOW (ref 3.5–5.2)
Calcium: 9.9 mg/dL (ref 8.4–10.5)
Creatinine, Ser: 7.59 mg/dL — ABNORMAL HIGH (ref 0.50–1.35)
Sodium: 137 mEq/L (ref 135–145)
Total Protein: 5.9 g/dL — ABNORMAL LOW (ref 6.0–8.3)

## 2012-07-02 LAB — MAGNESIUM: Magnesium: 2 mg/dL (ref 1.5–2.5)

## 2012-07-02 LAB — PHOSPHORUS: Phosphorus: 3.8 mg/dL (ref 2.3–4.6)

## 2012-07-02 LAB — CBC
MCH: 26.7 pg (ref 26.0–34.0)
MCHC: 33.2 g/dL (ref 30.0–36.0)
MCV: 80.3 fL (ref 78.0–100.0)
Platelets: 102 10*3/uL — ABNORMAL LOW (ref 150–400)
RBC: 4.01 MIL/uL — ABNORMAL LOW (ref 4.22–5.81)
RDW: 16.4 % — ABNORMAL HIGH (ref 11.5–15.5)

## 2012-07-02 LAB — AFP TUMOR MARKER: AFP-Tumor Marker: 3.1 ng/mL (ref 0.0–8.0)

## 2012-07-02 LAB — PATHOLOGIST SMEAR REVIEW

## 2012-07-02 MED ORDER — HEPARIN SODIUM (PORCINE) 1000 UNIT/ML DIALYSIS
1500.0000 [IU] | INTRAMUSCULAR | Status: DC | PRN
  Administered 2012-07-03: 1500 [IU] via INTRAVENOUS_CENTRAL
  Filled 2012-07-02: qty 2

## 2012-07-02 MED ORDER — CLINIMIX E/DEXTROSE (5/15) 5 % IV SOLN
INTRAVENOUS | Status: DC
  Administered 2012-07-02: 18:00:00 via INTRAVENOUS
  Filled 2012-07-02 (×2): qty 2000

## 2012-07-02 MED ORDER — FAT EMULSION 20 % IV EMUL
240.0000 mL | INTRAVENOUS | Status: DC
  Administered 2012-07-02: 240 mL via INTRAVENOUS
  Filled 2012-07-02: qty 250

## 2012-07-02 NOTE — Progress Notes (Signed)
Patient ID: [name], male   DOB: 07/17/1961, 51 y.o.   MRN: [id number]    Subjective: Pt wo complaints, denies abd pain, denies fever  Objective: Vital signs in last 24 hours: Temp:  [97.7 F (36.5 C)-102.6 F (39.2 C)] 100 F (37.8 C) (10/11 0530) Pulse Rate:  [78-133] 100  (10/11 0530) Resp:  [15-21] 16  (10/11 0530) BP: (99-145)/(52-81) 129/58 mmHg (10/11 0530) SpO2:  [95 %-100 %] 100 % (10/11 0530) Weight:  [105 lb 2.6 oz (47.7 kg)-113 lb 5.1 oz (51.4 kg)] 113 lb 5.1 oz (51.4 kg) (10/10 2248) Last BM Date: 07/01/12  Intake/Output from previous day:   Intake/Output this shift:    PE: Abd: soft, well healed midline, RLQ drain with bilious output (about 80cc, none recorded in chart but patient reports that it was emptied yesterday), PICC line in right groin  Lab Results:   [facility] 07/02/12 0610 07/01/12 0842  WBC 6.2 8.8  HGB 10.7* 12.6*  HCT 32.2* 37.7*  PLT 102* 94*   BMET  Basename 07/02/12 0610 07/01/12 0842  NA 137 140  K 4.2 3.3*  CL 97 96  CO2 24 27  GLUCOSE 79 74  BUN 53* 41*  CREATININE 7.59* 5.75*  CALCIUM 9.9 10.1   PT/INR No results found for this basename: LABPROT:2,INR:2 in the last 72 hours CMP     Component Value Date/Time   NA 137 07/02/2012 0610   K 4.2 07/02/2012 0610   CL 97 07/02/2012 0610   CO2 24 07/02/2012 0610   GLUCOSE 79 07/02/2012 0610   BUN 53* 07/02/2012 0610   CREATININE 7.59* 07/02/2012 0610   CALCIUM 9.9 07/02/2012 0610   PROT 5.9* 07/02/2012 0610   ALBUMIN 2.3* 07/02/2012 0610   AST 47* 07/02/2012 0610   ALT 32 07/02/2012 0610   ALKPHOS 156* 07/02/2012 0610   BILITOT 0.7 07/02/2012 0610   GFRNONAA 7* 07/02/2012 0610   GFRAA 8* 07/02/2012 0610   Lipase  No results found for this basename: lipase       Studies/Results: Dg Chest 2 View  07/01/2012  *RADIOLOGY REPORT*  Clinical Data: Weakness.  Fever.  Dialysis patient.  CHEST - 2 VIEW  Comparison: 04/28/2012.  Findings: Catheter projects over the  cardiopericardial silhouette inferior approach.  The tip of the catheter is in the right atrium. There is no airspace disease or effusion.  Cardiopericardial silhouette is within normal limits.  Aortic arch atherosclerosis. No pulmonary edema or volume overload. On the lateral view, there appears to be a left anterior descending coronary artery stent.  IMPRESSION: No acute cardiopulmonary disease.  Inferior approach catheter in the right atrium.   Original Report Authenticated By: Andreas Newport, M.D.     Anti-infectives: Anti-infectives     Start     Dose/Rate Route Frequency Ordered Stop   07/03/12 1200   vancomycin (VANCOCIN) 500 mg in sodium chloride 0.9 % 100 mL IVPB        500 mg 100 mL/hr over 60 Minutes Intravenous Every T-Th-Sa (Hemodialysis) 07/01/12 1705     07/01/12 1900   micafungin (MYCAMINE) 100 mg in sodium chloride 0.9 % 100 mL IVPB        100 mg 100 mL/hr over 1 Hours Intravenous Daily 07/01/12 1551     07/01/12 1900  piperacillin-tazobactam (ZOSYN) IVPB 2.25 g       2.25 g 100 mL/hr over 30 Minutes Intravenous 3 times per day 07/01/12 1705     07/01/12 1400   piperacillin-tazobactam (ZOSYN)  IVPB 3.375 g  Status:  Discontinued        3.375 g 100 mL/hr over 30 Minutes Intravenous 3 times per day 07/01/12 1023 07/01/12 1113   07/01/12 1115  piperacillin-tazobactam (ZOSYN) IVPB 3.375 g       3.375 g 100 mL/hr over 30 Minutes Intravenous  Once 07/01/12 1113     07/01/12 1030   vancomycin (VANCOCIN) IVPB 1000 mg/200 mL premix        1,000 mg 200 mL/hr over 60 Minutes Intravenous  Once 07/01/12 1023 07/01/12 1318           Assessment/Plan  1.  Fever: likely due to line, awaiting blood cultures, need to know what his actual calorie count is to decide if can d/c TPN and PICC, also need to monitor output from drain closely.   No surgical intervention at this time, will follow.  LOS: 1 day    Fanny Agan 07/02/2012

## 2012-07-02 NOTE — Progress Notes (Signed)
Subjective: Pt frustrated about continuing TNA, does not want more TNA, wants cath out and wants to get out of SNF. Wants to see how he can do on po intake.    Objective Vital signs in last 24 hours: Filed Vitals:   07/01/12 1619 07/01/12 1622 07/01/12 2248 07/02/12 0530  BP: 131/67 136/69 117/62 129/58  Pulse: 86 91 90 100  Temp:   98.5 F (36.9 C) 100 F (37.8 C)  TempSrc:   Oral Oral  Resp:   17 16  Height:      Weight:   51.4 kg (113 lb 5.1 oz)   SpO2:   99% 100%   Weight change:   Intake/Output Summary (Last 24 hours) at 07/02/12 1424 Last data filed at 07/02/12 0823  Gross per 24 hour  Intake      0 ml  Output     50 ml  Net    -50 ml   Labs: Basic Metabolic Panel:  Lab 07/02/12 9147 07/01/12 0842  NA 137 140  K 4.2 3.3*  CL 97 96  CO2 24 27  GLUCOSE 79 74  BUN 53* 41*  CREATININE 7.59* 5.75*  ALB -- --  CALCIUM 9.9 10.1  PHOS 3.8 --   Liver Function Tests:  Lab 07/02/12 0610 07/01/12 0842  AST 47* 43*  ALT 32 35  ALKPHOS 156* 198*  BILITOT 0.7 1.0  PROT 5.9* 6.8  ALBUMIN 2.3* 2.7*   No results found for this basename: LIPASE:3,AMYLASE:3 in the last 168 hours No results found for this basename: AMMONIA:3 in the last 168 hours CBC:  Lab 07/02/12 0610 07/01/12 0842  WBC 6.2 8.8  NEUTROABS -- 7.6  HGB 10.7* 12.6*  HCT 32.2* 37.7*  MCV 80.3 80.6  PLT 102* 94*    Physical Exam:  Blood pressure 129/58, pulse 100, temperature 100 F (37.8 C), temperature source Oral, resp. rate 16, height 6' (1.829 m), weight 51.4 kg (113 lb 5.1 oz), SpO2 100.00%.  Gen: alert, no distress  Neck: no JVD, no bruits or LAN  Chest: clear bilat, dec'd air movement throughout  Heart: regular, no rub or gallop, soft SEM  Abdomen: soft, midline wound is well healed, scaphoid, nontender. RLQ drain exit site is dry and without drainage; fluid in leg bag is dark brown, bilious material  Ext: R femoral PICC exit site is minimally erythematous and without drainage. L thigh  AVGG without signs of infection.  Neuro: alert, Ox3, no focal deficit, no asterixis   Outpatient HD: [facility] TTS, 3.5 hours. EDW 44kg. L thigh AVGG. No EPO, no Zemplar , no IV iron. (Hb 12). Heparin 1500u. Bath 2K/2.0Ca. Last PTH 238, phos 4.2, Ca 9.9.   Impression/Plan  1. Fevers- cultures pending, on empiric abx.  2. CKD, cont hd TTS. Up 7 kg by weights, max UF with HD Sat 3. SBO s/p exlap wSB resection (04/14/12) and went back to OR (8/1) for dehisced bowel, did more SB resection and omental patch repair to dehisced area. 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  4. HTPH- cont binders 5. Chronic prednisone- pred 5/d. 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 He had severe BM suppression from allopurinol and this is one of the reasons he is on prednisone for gout.  It is not for his ESRD or his former transplant. He still requires temp increases in dose of prednisone for flare-ups. Dr. Darrick Penna managing this.  6. Hx gout 7. Hx  HTN in past, not on any BP meds now. 8. Hx prior renal transplant ('87 - 2004) 9. Nutrition- discussed with surgery, go ahead and stop TNA, remove femoral line. Will d/w primary service.   Vinson Moselle  MD [employer] [phone] pgr    [phone] cell 07/02/2012, 2:24 PM

## 2012-07-02 NOTE — H&P (Signed)
Internal Medicine teaching Service Attending Dr.Elianah Karis. I have personally examined the patient and reviewed the h and P documented by the Resident. In brief  Chief complaint: Fever HOPI: Mr.Smyth is feeling much better today morning. Rest per medical student documentation at the time of arrival. 9 point review of system as documented in the Resident note. Social history admitting medication family history past surgical history allergies reviewed. Physical examination Notable for: vitals stable. Left AV graft - thigh Right picc line - skin surrounding it no evidence of gross infection. 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 Agree with high suspicion for fungemia. Continue board spectrum. Consult ID. GI : consider the need for continuing TPN. Renal:  Left LE swelling probably related to left AV graft.appreciate renal consult Rest per resident documentation.

## 2012-07-02 NOTE — Consult Note (Signed)
[facility]     Reason for Consult:bacteremia    Referring Physician: Dr. Lonzo Cloud  Principal Problem:  *Fever Active Problems:  SBO (small bowel obstruction) s/p EL/LOA and SBR x 2  Thrombocytopenia  Gout  HTN (hypertension)  Chronic use of steroids  Malnutrition, calorie  Anemia  ESRD (end stage renal disease) on dialysis      . calcium acetate  667 mg Oral TID WC  . feeding supplement  1 Container Oral TID BM  . heparin  5,000 Units Subcutaneous Q8H  . multivitamin  1 tablet Oral Daily  . pantoprazole  40 mg Oral Q1200  . predniSONE  5 mg Oral Daily  . sodium chloride  3 mL Intravenous Q12H  . vancomycin  500 mg Intravenous Q T,Th,Sa-HD  . vancomycin  1,000 mg Intravenous Once  . DISCONTD: micafungin (MYCAMINE) IV  100 mg Intravenous Daily  . DISCONTD: piperacillin-tazobactam (ZOSYN)  IV  2.25 g Intravenous Q8H  . DISCONTD: piperacillin-tazobactam  3.375 g Intravenous Once    Recommendations: Continue vancomycin until ID of organism  Hepatitis C - HCC screening done with CT abd in August.   Assessment: He is chronically ill and TPN dependent and tells me that it was very difficult to place a line in him which is why it is in his groin. If it ends up being CoNS, it is possible to treat through a line infection, though still not ideal.  If other organisms such as Staph aureus would require removal.  I do think his line is the source.  Ideally, he will be able to take in his nutrition orally and surgery has initiated a calorie count, that way line could come out.   Hepatitis C -  Not sure if active, I do not find an active RNA.  He does have suggestions of cirrhosis with low platelets and low albumin but this also can be from his chronic illnesses.  Not a treatment candidate.    Antibiotics: Vancomycin 10/10 -   HPI: [name] is a 51 y.o. male with a history of ESRD and s/p transplant in 1998 with rejection and chronic steroid use 2/2 to  rejection, SBO complicatedd by enterocutanous fistula and malnutrition requiring chronic TPN who presented 10/10 with fever and tachycardia at dialysis.  He reports not feeling well overall.  No n/v, no diarrhea.  He has a chronic drain for his fistula and right groin PICC for TPN.  Also with left groin AV graft. Currently a nursing home resident.     Review of Systems: Pertinent items are noted in HPI.  Past Medical History  Diagnosis Date  . ESRD needing dialysis   . Prostate cancer   . Hypertension   . Renal transplant disorder 1998    Rejected  . Gout tophi   . Chronic steroid use     For renal transplant  . Systolic CHF, chronic     2 D echo 04/2012 with EF of 45 %   . Malnutrition   . 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 F/U with Dr Dwain Sarna. Started on TNA  . Anemia associated with chronic renal failure   . Hepatitis C     History  Substance Use Topics  . Smoking status: Former Games developer  . Smokeless tobacco: Not on file   Comment: Quit 30 years ago  . Alcohol Use: No  Family History  Problem Relation Age of Onset  . Hypertension Father   . Pneumonia Brother   . Lung disease Brother    Allergies  Allergen Reactions  . Allopurinol     REACTION: decreased platelets  . Aspirin     REACTION: unspecified    OBJECTIVE: Blood pressure 129/58, pulse 100, temperature 100 F (37.8 C), temperature source Oral, resp. rate 16, height 6' (1.829 m), weight 113 lb 5.1 oz (51.4 kg), SpO2 100.00%. General: Awake, in chair, nad; thin and chronically ill appearing. Skin: no rashes Lungs: CTA B Cor: RRR Abdomen: surgical scar noted, no erythema.  Drain in place with bilious fluid.    Microbiology: Recent Results (from the past 240 hour(s))  CULTURE, BLOOD (ROUTINE X 2)     Status: Normal (Preliminary result)   Collection Time   07/01/12  8:51 AM      Component Value  Range Status Comment   Specimen Description BLOOD HAND RIGHT   Final    Special Requests BOTTLES DRAWN AEROBIC AND ANAEROBIC 10CC   Final    Culture  Setup Time 07/01/2012 16:06   Final    Culture     Final    Value: GRAM POSITIVE COCCI IN PAIRS     Note: Gram Stain Report Called to,Read Back By and Verified With: [facility] COLL[facility] @ [phone] 07/02/12 BY KRAWS   Report Status PENDING   Incomplete   CULTURE, BLOOD (ROUTINE X 2)     Status: Normal (Preliminary result)   Collection Time   07/01/12  9:00 AM      Component Value Range Status Comment   Specimen Description BLOOD HAND RIGHT   Final    Special Requests BOTTLES DRAWN AEROBIC AND ANAEROBIC 10CC   Final    Culture  Setup Time 07/01/2012 16:06   Final    Culture     Final    Value:        BLOOD CULTURE RECEIVED NO GROWTH TO DATE CULTURE WILL BE HELD FOR 5 DAYS BEFORE ISSUING A FINAL NEGATIVE REPORT   Report Status PENDING   Incomplete     Staci Righter, MD [facility] [phone] pager  [phone] cell 07/02/2012, 11:44 AM

## 2012-07-02 NOTE — Progress Notes (Signed)
CRITICAL VALUE ALERT  Critical value received:  Blood cultures positive for gram + cocci in pairs Date of notification: 07/02/12  Time of notification: 0950  Critical value read back:yes  Nurse who received alert:  Richardean Canal, RN  MD notified (1st page):  Johny Drilling  Time of first page:  [phone]  MD notified (2nd page):  Time of second page:  Responding MD: Johny Drilling  Time MD responded: [phone]

## 2012-07-02 NOTE — Progress Notes (Signed)
INITIAL ADULT NUTRITION ASSESSMENT Date: 07/02/2012   Time: 10:40 AM  Reason for Assessment: Calorie Count Consult + new TPN  INTERVENTION: 1. TPN per pharmacy 2. RD to review calorie count results and provide recommendations as able 3. RD to continue to follow nutrition care plan  DOCUMENTATION CODES Per approved criteria  -Severe malnutrition in the context of chronic illness -Underweight   ASSESSMENT: Male 51 y.o.  Dx: Fever  Hx:  Past Medical History  Diagnosis Date  . ESRD needing dialysis   . Prostate cancer   . Hypertension   . Renal transplant disorder 1998    Rejected  . Gout tophi   . Chronic steroid use     For renal transplant  . Systolic CHF, chronic     2 D echo 04/2012 with EF of 45 %   . Malnutrition   . 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 F/U with Dr Dwain Sarna. Started on TNA  . Anemia associated with chronic renal failure   . Hepatitis C    Past Surgical History  Procedure Date  . Laparotomy 04/14/2012    Procedure: EXPLORATORY LAPAROTOMY;  Surgeon: Clovis Pu. Cornett, MD;  Location: [facility] OR;  Service: General;  Laterality: N/A;  . Colon resection 04/14/2012  . Laparotomy 04/22/2012    Procedure: EXPLORATORY LAPAROTOMY;  Surgeon: Ardeth Sportsman, MD;  Location: [facility] OR;  Service: General;  Laterality: N/A;  lysis of adhesions, omentoplasty, repair small bowel  . Thrombectomy w/ embolectomy 04/27/2012    Procedure: THROMBECTOMY ARTERIOVENOUS GORE-TEX GRAFT;  Surgeon: Chuck Hint, MD;  Location: [facility] OR;  Service: Vascular;  Laterality: Left;  Thrombectomy of left thigh arteriovenous gortex graft  . Prostectomy 2011   Related Meds:     . calcium acetate  667 mg Oral TID WC  . feeding supplement  1 Container Oral TID BM  . heparin  5,000 Units Subcutaneous Q8H  . ibuprofen  600 mg Oral Once  . micafungin [facility]) IV  100 mg Intravenous Daily    . multivitamin  1 tablet Oral Daily  . pantoprazole  40 mg Oral Q1200  . piperacillin-tazobactam (ZOSYN)  IV  2.25 g Intravenous Q8H  . piperacillin-tazobactam  3.375 g Intravenous Once  . predniSONE  5 mg Oral Daily  . sodium chloride  1,000 mL Intravenous Once  . sodium chloride  3 mL Intravenous Q12H  . vancomycin  500 mg Intravenous Q T,Th,Sa-HD  . vancomycin  1,000 mg Intravenous Once  . DISCONTD: piperacillin-tazobactam  3.375 g Intravenous Q8H   Ht: 6' (182.9 cm)  Wt: 113 lb 5.1 oz (51.4 kg) - pt has yet to receive HD, so dry weight is unavailable EDW per nephrology: 44 kg  Ideal Wt: 80.9 kg/178 lb % Ideal Wt: 63.5%  Wt Readings from Last 25 Encounters:  07/01/12 113 lb 5.1 oz (51.4 kg)  06/23/12 102 lb 6.4 oz (46.448 kg)  06/18/12 100 lb (45.36 kg)  06/05/12 107 lb 9.4 oz (48.8 kg)  06/05/12 107 lb 9.4 oz (48.8 kg)  06/05/12 107 lb 9.4 oz (48.8 kg)  06/05/12 107 lb 9.4 oz (48.8 kg)  06/05/12 107 lb 9.4 oz (48.8 kg)  Usual Wt: 52.5 kg % Usual Wt: 98%  Body mass index is 15.37 kg/(m^2). Underweight  Food/Nutrition Related Hx: pt with TPN and PO intake at baseline (from facility)  Labs:  CMP     Component  Value Date/Time   NA 137 07/02/2012 0610   K 4.2 07/02/2012 0610   CL 97 07/02/2012 0610   CO2 24 07/02/2012 0610   GLUCOSE 79 07/02/2012 0610   BUN 53* 07/02/2012 0610   CREATININE 7.59* 07/02/2012 0610   CALCIUM 9.9 07/02/2012 0610   PROT 5.9* 07/02/2012 0610   ALBUMIN 2.3* 07/02/2012 0610   AST 47* 07/02/2012 0610   ALT 32 07/02/2012 0610   ALKPHOS 156* 07/02/2012 0610   BILITOT 0.7 07/02/2012 0610   GFRNONAA 7* 07/02/2012 0610   GFRAA 8* 07/02/2012 0610   Sodium  Date/Time Value Range Status  07/02/2012  6:10 AM 137  135 - 145 mEq/L Final  07/01/2012  8:42 AM 140  135 - 145 mEq/L Final  06/05/2012  7:22 AM 135  135 - 145 mEq/L Final    Potassium  Date/Time Value Range Status  07/02/2012  6:10 AM 4.2  3.5 - 5.1 mEq/L Final     DELTA CHECK  NOTED  07/01/2012  8:42 AM 3.3* 3.5 - 5.1 mEq/L Final  06/05/2012  7:22 AM 3.9  3.5 - 5.1 mEq/L Final    Phosphorus  Date/Time Value Range Status  07/02/2012  6:10 AM 3.8  2.3 - 4.6 mg/dL Final  06/05/7828  5:62 AM 4.2  2.3 - 4.6 mg/dL Final  10/22/8655  8:46 AM 2.2* 2.3 - 4.6 mg/dL Final    Magnesium  Date/Time Value Range Status  07/02/2012  6:10 AM 2.0  1.5 - 2.5 mg/dL Final  9/62/9528  4:13 AM 1.7  1.5 - 2.5 mg/dL Final  10/26/4008  2:72 AM 1.6  1.5 - 2.5 mg/dL Final   Prealbumin  Date/Time Value Range Status  05/31/2012  6:50 AM 24.7  17.0 - 34.0 mg/dL Final  01/23/6643  0:34 AM 24.7  17.0 - 34.0 mg/dL Final  7/42/5956  3:87 AM 23.4  17.0 - 34.0 mg/dL Final    Intake/Output Summary (Last 24 hours) at 07/02/12 1043 Last data filed at 07/02/12 5643  Gross per 24 hour  Intake      0 ml  Output     50 ml  Net    -50 ml   Diet Order: General  Supplements/Tube Feeding: Resource Breeze PO TID  IVF:    Estimated Nutritional Needs[calculated using the Kidney Disease Outcomes Quality Initiative (KDOQI) Adjustment Equation for the Underweight Patient]:    Kcal:  1650 - 1850 kcal Protein: 75 - 90 grams protein Fluid: 1.2 liters daily  This RD is very familiar with this patient from previous lengthy hospitalization.  Pt is s/p small bowel resection for SBO and re-operation for anastomotic leak. Pt was placed on TPN when he developed an enterocutaneous fistula. Pt is currently admitted for fever, per internal medicine resident, pt with infected line.   Pt was receiving TPN at facility as well as consuming a diet. Team has initiated calorie counts for assessment of PO intake adequacy. Per chart, TPN will start today. Unable to review TPN orders at this time.  Reviewed purpose of calorie count with patient and discussed intake with patient. Per pt, he consumed 90% of eggs, 90% biscuit, and 100% of sausage. This RD tried to confirm this with RN and tech, however neither of them saw tray.  Encouraged pt to continue to maximize PO intake as able.  Noted that patient's EDW (per renal) in July was 52.5 kg and now is down to 44 kg. This is a weight change of 11% x 3 months. Pt  with severe muscle mass and fat mass loss as well. Pt meets criteria for severe malnutrition in the context of chronic illness as evidenced by 11% wt loss x 3 months and severe muscle mass and fat mass loss.  NUTRITION DIAGNOSIS: Increased nutrient needs r/t recent surgery and HD AEB estimated needs.  MONITORING/EVALUATION(Goals): Goal: Pt to meet >/= 90% of their estimated nutrition needs (with TPN and PO intake) Monitor: weight trends, lab trends, I/O's, PO intake, supplement tolerance  EDUCATION NEEDS: -Education needs addressed  Jarold Motto MS, RD, LDN Pager: [phone] After-hours pager: [phone]

## 2012-07-02 NOTE — Progress Notes (Signed)
ATTENDING ADDENDUM:  I personally reviewed patient's record, examined the patient, and formulated the following plan:  Patient seems to be doing well with his diet.  Will continue to monitor calorie counts.  Drain output minimal on diet.  Line cx NGTD x1day.  Fevers better on abx.  Cont current plan

## 2012-07-02 NOTE — Progress Notes (Signed)
Medical Student Daily Progress Note   Subjective:    Interval Events:  No acute overnight events. Says that he has slept OK and is eating well. Had a bowel movement yesterday that was regular consistency. Endorses some abdominal bloating and increased gas but does not endorse distention or pain concerning for an acute abdomen. Does not endorse any rigors, fever, calf pain, skin warmth, shortness of breath, or chest pain.    Objective:    Vital Signs:   Temp:  [97.7 F (36.5 C)-100.2 F (37.9 C)] 100 F (37.8 C) (10/11 0530) Pulse Rate:  [78-114] 100  (10/11 0530) Resp:  [16-18] 16  (10/11 0530) BP: (102-145)/(52-79) 129/58 mmHg (10/11 0530) SpO2:  [95 %-100 %] 100 % (10/11 0530) Weight:  [47.7 kg (105 lb 2.6 oz)-51.4 kg (113 lb 5.1 oz)] 51.4 kg (113 lb 5.1 oz) (10/10 2248) Last BM Date: 07/01/12   Weights: 24-hour Weight change:   Filed Weights   07/01/12 1540 07/01/12 2248  Weight: 47.7 kg (105 lb 2.6 oz) 51.4 kg (113 lb 5.1 oz)     Intake/Output:   Intake/Output Summary (Last 24 hours) at 07/02/12 0957 Last data filed at 07/02/12 0823  Gross per 24 hour  Intake      0 ml  Output     50 ml  Net    -50 ml       Physical Exam: General appearance: alert, cooperative and no distress Neck: no adenopathy and no JVD Resp: clear to auscultation bilaterally Cardio: regular rate and rhythm, S1, S2 normal, no murmur, click, rub or gallop GI: soft, non-tender; bowel sounds normal; no masses,  no organomegaly and surgical scar from ex lap present - no erythema, no warmth, no tenderness Extremities: left foot swollen, decreased DP pulse, no pitting edema, no erythema/warmth, no calf pain, no ulcerations. RLE WNL Pulses: Left DP barely palpable  Lines 1) PICC line in right groin - no warmth, tenderness, or erythema at site. 2) Left femoral AV graft for HD catheter  3) RLQ drain from [facility] fistula - draining bilious fluid 4) R peripheral IV  Labs: Basic Metabolic  Panel:  Lab 07/02/12 0610 07/01/12 0842  NA 137 140  K 4.2 3.3*  CL 97 96  CO2 24 27  GLUCOSE 79 74  BUN 53* 41*  CREATININE 7.59* 5.75*  CALCIUM 9.9 10.1  MG 2.0 --  PHOS 3.8 --    Liver Function Tests:  Lab 07/02/12 0610 07/01/12 0842  AST 47* 43*  ALT 32 35  ALKPHOS 156* 198*  BILITOT 0.7 1.0  PROT 5.9* 6.8  ALBUMIN 2.3* 2.7*   No results found for this basename: LIPASE:5,AMYLASE:5 in the last 168 hours No results found for this basename: AMMONIA:3 in the last 168 hours  CBC:  Lab 07/02/12 0610 07/01/12 0842  WBC 6.2 8.8  NEUTROABS -- 7.6  HGB 10.7* 12.6*  HCT 32.2* 37.7*  MCV 80.3 80.6  PLT 102* 94*    Cardiac Enzymes: No results found for this basename: CKTOTAL:5,CKMB:5,CKMBINDEX:5,TROPONINI:5 in the last 168 hours  BNP (last 3 results): No results found for this basename: PROBNP:3 in the last 8760 hours  CBG: No results found for this basename: GLUCAP:5 in the last 168 hours  Coagulation Studies: No results found for this basename: LABPROT:5,INR:5 in the last 72 hours  Microbiology: Results for orders placed during the hospital encounter of 07/01/12  CULTURE, BLOOD (ROUTINE X 2)     Status: Normal (Preliminary result)   Collection Time  07/01/12  8:51 AM      Component Value Range Status Comment   Specimen Description BLOOD HAND RIGHT   Final    Special Requests BOTTLES DRAWN AEROBIC AND ANAEROBIC 10CC   Final    Culture  Setup Time 07/01/2012 16:06   Final    Culture     Final    Value: GRAM POSITIVE COCCI IN PAIRS     Note: Gram Stain Report Called to,Read Back By and Verified With: [facility] COLLINS @ [phone] 07/02/12 BY KRAWS   Report Status PENDING   Incomplete   CULTURE, BLOOD (ROUTINE X 2)     Status: Normal (Preliminary result)   Collection Time   07/01/12  9:00 AM      Component Value Range Status Comment   Specimen Description BLOOD HAND RIGHT   Final    Special Requests BOTTLES DRAWN AEROBIC AND ANAEROBIC 10CC   Final    Culture  Setup  Time 07/01/2012 16:06   Final    Culture     Final    Value:        BLOOD CULTURE RECEIVED NO GROWTH TO DATE CULTURE WILL BE HELD FOR 5 DAYS BEFORE ISSUING A FINAL NEGATIVE REPORT   Report Status PENDING   Incomplete     Other results:   Imaging: Dg Chest 2 View  07/01/2012  *RADIOLOGY REPORT*  Clinical Data: Weakness.  Fever.  Dialysis patient.  CHEST - 2 VIEW  Comparison: 04/28/2012.  Findings: Catheter projects over the cardiopericardial silhouette inferior approach.  The tip of the catheter is in the right atrium. There is no airspace disease or effusion.  Cardiopericardial silhouette is within normal limits.  Aortic arch atherosclerosis. No pulmonary edema or volume overload. On the lateral view, there appears to be a left anterior descending coronary artery stent.  IMPRESSION: No acute cardiopulmonary disease.  Inferior approach catheter in the right atrium.   Original Report Authenticated By: Andreas Newport, M.D.       Medications:    Infusions:     Scheduled Medications:    . calcium acetate  667 mg Oral TID WC  . feeding supplement  1 Container Oral TID BM  . heparin  5,000 Units Subcutaneous Q8H  . ibuprofen  600 mg Oral Once  . iohexol  20 mL Oral Q1 Hr x 2  . micafungin (MYCAMINE) IV  100 mg Intravenous Daily  . multivitamin  1 tablet Oral Daily  . pantoprazole  40 mg Oral Q1200  . piperacillin-tazobactam (ZOSYN)  IV  2.25 g Intravenous Q8H  . piperacillin-tazobactam  3.375 g Intravenous Once  . predniSONE  5 mg Oral Daily  . sodium chloride  1,000 mL Intravenous Once  . sodium chloride  3 mL Intravenous Q12H  . vancomycin  500 mg Intravenous Q T,Th,Sa-HD  . vancomycin  1,000 mg Intravenous Once  . DISCONTD: piperacillin-tazobactam  3.375 g Intravenous Q8H     PRN Medications: oxyCODONE-acetaminophen   Assessment/ Plan:    [name] is a 51 yo is a cachetic patient with a h/o of steroid dependent ESRD on HD and HTN, s/p exploratory laparotomy with  proximal small bowel resection 2 months ago for SBO c/b an enterocutaneous fistula who is admitted for fever of unknown origin.   1) Fever - The patient's fever is most likely 2/2 to bacteremia d/t catheter related infection. Given his TPN administration, he is at an increased risk for catheter-related candidemia. Surgery was consulted and they did not think  his infection was coming from an abdominal source. CT abdomen on 10/7 showed no residual fluid collection in region of RLQ percutaneous drainage catheter or active infection. Emperic vancomycin, zosyn, and mycofungin were started on 10/10. Peripheral blood cultures x2 were drawn 10/10 and one came back positive for Gram positive cocci in pairs. Common possible organisms could include Strep pneumo, S. viridans, S agalactiae, or enterococcus. Per ID consult 10/11, zosyn and mycofungin were D/C'd and vancomycin was continued until further identification of causative organism, at which point we will begin to narrow therapy. Per ID, PICC line does not require removal at this point, but may in the future. Surgery was consulted as to whether TPN remains a requirement, please see point #2 for further discussion. Cultures from his PICC line were drawn today.   --F/u blood cultures x2.  --F/u PICC line cultures --Initated Vanc+Zosyn+Mycofungin 10/10, d/c zosyn, mycofungin 10/11.  2) Malnutrition - Currently receiving TNA. Albumin is 2.7 on admission and baseline prealbumin is ~ 25. Of note, < 30 mg/dl indicative of malnutrition while on HD. Surgery was consulted and they are getting a calorie count to decide whether or not to continue TPN. Appreciate their consult. In the case the PICC line needs to be discontinued, plan is to take a TPN holiday and then restart TPN once the infection clears using a PICC line in the same area. Patient has very limited sites of access. See discussion below. --F/u prealbumin levels.  --Continue TPN.   3) Line management - Patient  currently has right peripheral IV, a PICC in his right femoral vein, percutaneous RLQ drain for a small bowel leak, and mature left femoral AV graft for HD. 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 Per surgery, if PICC line is removed but the patient still requires TPN, will start TPN holiday with plan to reinsert PICC line at right femoral due to limited access options following antibiotic treatment.  4) Unilateral LE swelling - Patient has had left lower limb edema since d/c from the hospital in August. Dopplers at the time were negative for DVT. The major risk factor for DVT in this patient is major surgery w/in 12 weeks. However, physical exam reveals a swollen left foot without erythema, pain, warmth, or ulceration. Low clinical suspicion for DVT. Other etiologies for this patient includes chronic venous insufficiency. --Dopplers of LLE to r/o DVT --DVT prophylaxis, below --Encourage ambulation, foot elevation.  5) ESRD - HD was interrupted today. Nephrology was consulted. Will help manage, appreciate their consult. Patient is currently on 5mg  prednisone daily, which he's taken for "years" after his transplant. Outpatient nephrologist currently maintaining dose. We spoke with the nephrology team and they will touch base with his outpatient nephrologist to see if continuation of his prednisone is necessary. We appreciate their consult.  6) Thrombocytopenia - The patient presents with a platelet count of 94. Currently 102. Baseline ranges from 120-161. Possible etiologies for thrombocytopenia in this patient include pseudothrombocytopenia vs. Infection vs. TTP vs ITP.    --Will continue to monitor CBC and for extrahematological symptoms.  --F/u blood smear. No prior blood smears available.   7) Sinus tachycardia - Has been documented in the past, but sporadic. Most likely 2/2  to fever / underlying infection. Currently stable.  8) Hepatitis C: Hepatitis panel was reactive for Hep C in 04/2012. Hepatitis B and HIV negative. Abdominal CT with contrast in 06/28/12 was negative for liver lesions suspicious for HCC. F/u on AFP tumor marker for monitoring for HCC, although evidence regarding the sensitivity for this test is mixed in the literature.  9) Gout - Currently stable, says that it improved after HD was started. Allergic to allopurinol.  10) Anemia - likely 2/2 anemia of chronic inflammation. Iron panel in July 2013 shows low iron and UIBC and elevated ferritin.  --Continue to monitor H/H.   11) Prophylaxis: --DVT: subcutaneous Heparin --PUD: Protonix  Wynelle Link, MS4 July 02, 2012   This is a Psychologist, occupational Note.  The care of the patient was discussed with Dr. Lonzo Cloud and the assessment and plan formulated with their assistance.  Please see their attached note or addendum for official documentation of the daily encounter.

## 2012-07-02 NOTE — Progress Notes (Signed)
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 Allopurinol     REACTION: decreased platelets  . Aspirin     REACTION: unspecified    Patient Measurements: Height: 6' (182.9 cm) Weight: 113 lb 5.1 oz (51.4 kg) IBW/kg (Calculated) : 77.6    Vital Signs: Temp: 100 F (37.8 C) (10/11 0530) Temp src: Oral (10/11 0530) BP: 129/58 mmHg (10/11 0530) Pulse Rate: 100  (10/11 0530) Intake/Output from previous day:   Intake/Output from this shift: Total I/O In: -  Out: 50 [Drains:50]  Labs:  [facility] 07/02/12 0610 07/01/12 0842  WBC 6.2 8.8  HGB 10.7* 12.6*  HCT 32.2* 37.7*  PLT 102* 94*  APTT -- --  INR -- --     [facility] 07/02/12 0610 07/01/12 0842  NA 137 140  K 4.2 3.3*  CL 97 96  CO2 24 27  GLUCOSE 79 74  BUN 53* 41*  CREATININE 7.59* 5.75*  LABCREA -- --  CREAT24HRUR -- --  CALCIUM 9.9 10.1  MG 2.0 --  PHOS 3.8 --  PROT 5.9* 6.8  ALBUMIN 2.3* 2.7*  AST 47* 43*  ALT 32 35  ALKPHOS 156* 198*  BILITOT 0.7 1.0  BILIDIR -- --  IBILI -- --  PREALBUMIN -- --  TRIG -- --  CHOLHDL -- --  CHOL -- --   Estimated Creatinine Clearance: 8.2 ml/min (by C-G formula based on Cr of 7.59).   No results found for this basename: GLUCAP:3 in the last 72 hours  Medications:  Prescriptions prior to admission  Medication Sig Dispense Refill  . calcium acetate (PHOSLO) 667 MG capsule Take 667 mg by mouth 3 (three) times daily with meals.      . cephALEXin (KEFLEX) 500 MG capsule Take 500 mg by mouth 2 (two) times daily. For 10 days starting on 06/30/12      . fat emulsion 20 % infusion Inject 120 mLs into the vein continuous.  250 mL    . feeding supplement (RESOURCE BREEZE) LIQD Take 1 Container by mouth 3 (three) times daily between meals.      . fentaNYL (DURAGESIC - DOSED MCG/HR) 12 MCG/HR Place 1 patch (12.5 mcg total) onto the skin every 3 (three) days.  5 patch  0  . multivitamin  (RENA-VIT) TABS tablet Take 1 tablet by mouth daily.      Marland Kitchen oxyCODONE-acetaminophen (PERCOCET/ROXICET) 5-325 MG per tablet Take 1 tablet by mouth every 6 (six) hours as needed. For pain      . pantoprazole (PROTONIX) 40 MG tablet Take 1 tablet (40 mg total) by mouth daily at 12 noon.      . predniSONE (DELTASONE) 5 MG tablet Take 5 mg by mouth daily.      Marland Kitchen PRESCRIPTION MEDICATION 4 (four) times a week. On Tues, Thurs, Sat and Sun - TPN: Clinimix 5/15, fat emulsion      . TPN ADULT Inject into the vein 3 (three) times a week. On Monday Wednesday and Friday - Fat emulsion 1200 ml, Dextrose 158 gm, protein 54 gm, 36 meq NA, 10 meq Potassium, 62 meq CL, 47 meq ack, mmol Phos daily and lipids 24 gm        Insulin Requirements in the past 24 hours:  No SSI ordered  Current Nutrition:  Regular diet, TPN supplemented  Nutritional Goals:  1650-1850 kCal, 75-90 grams of protein per day, 1.2 L/day  Assessment: [name] is  known to [place] from prior TPN management for ECF s/p SBR for SBO. Noted he has continued to take in POs, but not enough to sustain him alone, so he has continued on supplemental TPN. He appears to be doing well with PO intake now as well, but reiterated that he has never been a great eater. He also mentioned that he is having multiple BMs with more PO intake, which may be d/t to hx of SBR.  Admit: s/p chills and fever s/p HD GI: No abd pain/n/v, reportedly has multiple BMs daily, which is normal for him. Stools are reportedly formed and normal (not reported as diarrhea). He continues to do well with PO intake, noted plans for cal counts to assess ability to d/c TPN altogether. Endo: Noted BMET gluc is WNL, he is not currently on SSI/CBG checks. Continues on Prednisone. Lytes: WNL at baseline Renal: ESRD, HD dependant TTS. Lytes ok at baseline. Pulm: RA Cards: VSS Hepatobil: AST elevated slightly, other LFTs WNL Neuro: WNL, conversant and appropriate. ID: On Vanc empirically for  fever, suspected line infection--plans to remove lines if grows Staph aureus, per ID MD. Ellery Plunk Practices: SQ hep, PO PPI TPN Access: R femoral PICC TPN day#: ~101  Plan:  - Start Clinimix E 5/15 at 50cc/hr tonight to provide ~65% Kcal goals and ~80% protein goals in addition to PO intake. - Will hold off on supplementing IV MVI/trace elements as patient is taking PO MVI - Will supplement fats IV on MWF d/t ongoing national shortages - Will f/up to wean/discontinue TPN if he is taking inadequate protein and KCals PO - Will f/up BMET, Mg and Phos in AM - Will check CBG q6h x 4 starting 8pm today, and assess need to continue CBG monitoring given prior hx of great control without need for supplemental insulin.  [name] 07/02/2012,12:54 PM

## 2012-07-02 NOTE — Progress Notes (Signed)
VASCULAR LAB PRELIMINARY  PRELIMINARY  PRELIMINARY  PRELIMINARY  Bilateral lower extremity venous duplex  completed.    Preliminary report:  Bilateral:  No evidence of DVT, superficial thrombosis, or Baker's Cyst.  Left:  Sluggish flow noted in the FV, however is does compress fully. Mixed echoes noted in the groin at the CFV/graft.    Cameron Schwinn, RVT 07/02/2012, 12:25 PM

## 2012-07-02 NOTE — Progress Notes (Deleted)
Subjective: Pt frustrated about continuing TNA, does not want more TNA, wants cath out and wants to get out of SNF. Wants to see how he can do on po intake.    Objective Vital signs in last 24 hours: Filed Vitals:   07/01/12 1619 07/01/12 1622 07/01/12 2248 07/02/12 0530  BP: 131/67 136/69 117/62 129/58  Pulse: 86 91 90 100  Temp:   98.5 F (36.9 C) 100 F (37.8 C)  TempSrc:   Oral Oral  Resp:   17 16  Height:      Weight:   51.4 kg (113 lb 5.1 oz)   SpO2:   99% 100%   Weight change:   Intake/Output Summary (Last 24 hours) at 07/02/12 1343 Last data filed at 07/02/12 0823  Gross per 24 hour  Intake      0 ml  Output     50 ml  Net    -50 ml   Labs: Basic Metabolic Panel:  Lab 07/02/12 1478 07/01/12 0842  NA 137 140  K 4.2 3.3*  CL 97 96  CO2 24 27  GLUCOSE 79 74  BUN 53* 41*  CREATININE 7.59* 5.75*  ALB -- --  CALCIUM 9.9 10.1  PHOS 3.8 --   Liver Function Tests:  Lab 07/02/12 0610 07/01/12 0842  AST 47* 43*  ALT 32 35  ALKPHOS 156* 198*  BILITOT 0.7 1.0  PROT 5.9* 6.8  ALBUMIN 2.3* 2.7*   No results found for this basename: LIPASE:3,AMYLASE:3 in the last 168 hours No results found for this basename: AMMONIA:3 in the last 168 hours CBC:  Lab 07/02/12 0610 07/01/12 0842  WBC 6.2 8.8  NEUTROABS -- 7.6  HGB 10.7* 12.6*  HCT 32.2* 37.7*  MCV 80.3 80.6  PLT 102* 94*    Physical Exam:  Blood pressure 129/58, pulse 100, temperature 100 F (37.8 C), temperature source Oral, resp. rate 16, height 6' (1.829 m), weight 51.4 kg (113 lb 5.1 oz), SpO2 100.00%.  Gen: alert, no distress  Neck: no JVD, no bruits or LAN  Chest: clear bilat, dec'd air movement throughout  Heart: regular, no rub or gallop, soft SEM  Abdomen: soft, midline wound is well healed, scaphoid, nontender. RLQ drain exit site is dry and without drainage; fluid in leg bag is dark brown, bilious material  Ext: R femoral PICC exit site is minimally erythematous and without drainage. L thigh  AVGG without signs of infection.  Neuro: alert, Ox3, no focal deficit, no asterixis   Outpatient HD: [facility] TTS, 3.5 hours. EDW 44kg. L thigh AVGG. No EPO, no Zemplar , no IV iron. (Hb 12). Heparin 1500u. Bath 2K/2.0Ca. Last PTH 238, phos 4.2, Ca 9.9.   Impression/Plan  1. Fevers- cultures pending, on empiric abx.  2. CKD, cont hd TTS. Next HD Sat. No vol excess 3. SBO s/p exlap wSB resection (04/14/12) and went back to OR (8/1) for dehisced bowel, did more SB resection and omental patch repair to dehisced area. 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  4. HTPH- cont binders 5. Chronic prednisone- pred 5/d. 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 He had severe BM suppression from allopurinol and this is one of the reasons he is on prednisone for gout.  It is not for his ESRD or his former transplant. He still requires temp increases in dose of prednisone for flare-ups. Dr. Darrick Penna managing this.  6. Hx gout 7. Hx HTN 8. Hx prior  renal transplant ('87 - 2004) 9. Nutrition- discussed with surgery, go ahead and stop TNA, remove femoral line. Will d/w primary service.   Vinson Moselle  MD [facility] [phone] pgr    [phone] cell 07/02/2012, 1:43 PM

## 2012-07-03 ENCOUNTER — Inpatient Hospital Stay (HOSPITAL_COMMUNITY): Payer: Medicare Other

## 2012-07-03 ENCOUNTER — Inpatient Hospital Stay
Admit: 2012-07-03 | Discharge: 2012-07-03 | Disposition: A | Discharge status: Home / Self Care | Payer: Medicare Other | Attending: Nephrology | Admitting: Nephrology

## 2012-07-03 ENCOUNTER — Encounter (HOSPITAL_COMMUNITY): Payer: Self-pay | Admitting: Internal Medicine

## 2012-07-03 DIAGNOSIS — K632 Fistula of intestine: Secondary | ICD-10-CM

## 2012-07-03 LAB — CBC
HCT: 31.2 % — ABNORMAL LOW (ref 39.0–52.0)
MCHC: 33.3 g/dL (ref 30.0–36.0)
Platelets: 104 10*3/uL — ABNORMAL LOW (ref 150–400)
RDW: 16.3 % — ABNORMAL HIGH (ref 11.5–15.5)
WBC: 6.1 10*3/uL (ref 4.0–10.5)

## 2012-07-03 LAB — GLUCOSE, CAPILLARY
Glucose-Capillary: 120 mg/dL — ABNORMAL HIGH (ref 70–99)
Glucose-Capillary: 125 mg/dL — ABNORMAL HIGH (ref 70–99)

## 2012-07-03 LAB — BASIC METABOLIC PANEL
CO2: 22 mEq/L (ref 19–32)
Chloride: 99 mEq/L (ref 96–112)
Creatinine, Ser: 9.42 mg/dL — ABNORMAL HIGH (ref 0.50–1.35)
Potassium: 4.1 mEq/L (ref 3.5–5.1)

## 2012-07-03 LAB — MAGNESIUM: Magnesium: 2 mg/dL (ref 1.5–2.5)

## 2012-07-03 LAB — PHOSPHORUS: Phosphorus: 4.2 mg/dL (ref 2.3–4.6)

## 2012-07-03 MED ORDER — OXYCODONE-ACETAMINOPHEN 5-325 MG PO TABS
ORAL_TABLET | ORAL | Status: AC
  Administered 2012-07-03: 1 via ORAL
  Filled 2012-07-03: qty 1

## 2012-07-03 MED ORDER — RENA-VITE PO TABS
1.0000 | ORAL_TABLET | Freq: Every day | ORAL | Status: DC
  Administered 2012-07-04: 1 via ORAL
  Filled 2012-07-03 (×2): qty 1

## 2012-07-03 MED ORDER — FAT EMULSION 20 % IV EMUL
240.0000 mL | INTRAVENOUS | Status: DC
  Filled 2012-07-03: qty 250

## 2012-07-03 MED ORDER — VANCOMYCIN 50 MG/ML ORAL SOLUTION: 125.0000 mg | Freq: Four times a day (QID) | ORAL | Status: DC

## 2012-07-03 MED ORDER — CLINIMIX E/DEXTROSE (5/15) 5 % IV SOLN
INTRAVENOUS | Status: DC
  Filled 2012-07-03: qty 2000

## 2012-07-03 MED ORDER — VANCOMYCIN HCL 500 MG IV SOLR: 500.0000 mg | INTRAVENOUS | Status: DC

## 2012-07-03 MED ORDER — ACETAMINOPHEN 325 MG PO TABS
ORAL_TABLET | ORAL | Status: AC
  Administered 2012-07-03: 650 mg
  Filled 2012-07-03: qty 2

## 2012-07-03 MED ORDER — VANCOMYCIN HCL 500 MG IV SOLR: 250.0000 mg | INTRAVENOUS | Status: DC

## 2012-07-03 NOTE — Progress Notes (Signed)
Subjective: Temp spike to 103 last night, initial blood cx's + for GPC in pairs  Objective Vital signs in last 24 hours: Filed Vitals:   07/03/12 1000 07/03/12 1030 07/03/12 1041 07/03/12 1052  BP: 120/65 134/62 96/64 126/55  Pulse: 116 118 131 117  Temp:      TempSrc:      Resp: 23 26 29 19   Height:      Weight:      SpO2:       Weight change: 4.2 kg (9 lb 4.2 oz) No intake or output data in the 24 hours ending 07/03/12 1115 Labs: Basic Metabolic Panel:  Lab 07/03/12 1610 07/02/12 0610 07/01/12 0842  NA 138 137 140  K 4.1 4.2 3.3*  CL 99 97 96  CO2 22 24 27   GLUCOSE 123* 79 74  BUN 69* 53* 41*  CREATININE 9.42* 7.59* 5.75*  ALB -- -- --  CALCIUM 9.4 9.9 10.1  PHOS 4.2 3.8 --   Liver Function Tests:  Lab 07/02/12 0610 07/01/12 0842  AST 47* 43*  ALT 32 35  ALKPHOS 156* 198*  BILITOT 0.7 1.0  PROT 5.9* 6.8  ALBUMIN 2.3* 2.7*   No results found for this basename: LIPASE:3,AMYLASE:3 in the last 168 hours No results found for this basename: AMMONIA:3 in the last 168 hours CBC:  Lab 07/03/12 0810 07/02/12 0610 07/01/12 0842  WBC 6.1 6.2 8.8  NEUTROABS -- -- 7.6  HGB 10.4* 10.7* 12.6*  HCT 31.2* 32.2* 37.7*  MCV 78.8 80.3 80.6  PLT 104* 102* 94*    Physical Exam:  Blood pressure 126/55, pulse 117, temperature 98.2 F (36.8 C), temperature source Oral, resp. rate 19, height 6' (1.829 m), weight 49.1 kg (108 lb 3.9 oz), SpO2 97.00%.  Gen: alert, no distress  Neck: no JVD, no bruits or LAN  Chest: clear bilat, dec'd air movement throughout  Heart: regular, no rub or gallop, soft SEM  Abdomen: soft, midline wound is well healed, scaphoid, nontender. RLQ drain exit site is dry and without drainage; fluid in leg bag is dark brown, bilious material  Ext: R femoral PICC exit site is minimally erythematous and without drainage. L thigh AVGG without signs of infection.  Neuro: alert, Ox3, no focal deficit, no asterixis   Outpatient HD: [facility] TTS, 3.5 hours. EDW 44kg.  L thigh AVGG. No EPO, no Zemplar , no IV iron. (Hb 12). Heparin 1500u. Bath 2K/2.0Ca. Last PTH 238, phos 4.2, Ca 9.9.   Impression/Plan  1. Fevers / gram +bacteremia-  D/W primary, to get PICC line out per IR after HD today.  2. CKD, cont hd TTS. Up 7 kg by weights, max UF with HD Sat 3. SBO s/p exlap wSB resection (04/14/12) and went back to OR (8/1) for dehisced bowel, did more SB resection and omental patch repair to dehisced area. 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  4. HTPH- cont binders.  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 Had severe BM reaction to allopurinol. Would not try to taper. 6. Hx HTN in past, not on any BP meds now. 7. Hx prior renal transplant (1987 > 2004) 8. Nutrition- discussed with surgery, OK with stopping TNA, see how he does on oral nutrition alone.   Stephen Moselle  MD [facility] [phone] pgr    [phone] cell 07/03/2012, 11:15 AM

## 2012-07-03 NOTE — Progress Notes (Signed)
  Subjective: Frustrated with being in the NH, concerned about having another line for TNA after this one is removed  Objective: Vital signs in last 24 hours: Temp:  [98.2 F (36.8 C)-102.7 F (39.3 C)] 98.2 F (36.8 C) (10/12 0937) Pulse Rate:  [80-101] 101  (10/12 0930) Resp:  [18-26] 25  (10/12 0930) BP: (130-167)/(64-81) 150/71 mmHg (10/12 0930) SpO2:  [97 %-100 %] 97 % (10/12 0742) Weight:  [49.1 kg (108 lb 3.9 oz)-51.9 kg (114 lb 6.7 oz)] 49.1 kg (108 lb 3.9 oz) (10/12 0742) Last BM Date: 07/01/12  Intake/Output from previous day: 10/11 0701 - 10/12 0700 In: 240 [P.O.:240] Out: 50 [Drains:50] Intake/Output this shift:    General appearance: alert and cooperative GI: soft, flat, +BS, NT Drain with bilious output  Lab Results:   [facility] 07/03/12 0810 07/02/12 0610  WBC 6.1 6.2  HGB 10.4* 10.7*  HCT 31.2* 32.2*  PLT 104* 102*   BMET  Basename 07/03/12 0810 07/02/12 0610  NA 138 137  K 4.1 4.2  CL 99 97  CO2 22 24  GLUCOSE 123* 79  BUN 69* 53*  CREATININE 9.42* 7.59*  CALCIUM 9.4 9.9   PT/INR No results found for this basename: LABPROT:2,INR:2 in the last 72 hours ABG No results found for this basename: PHART:2,PCO2:2,PO2:2,HCO3:2 in the last 72 hours  Studies/Results: No results found.  Anti-infectives: Anti-infectives     Start     Dose/Rate Route Frequency Ordered Stop   07/03/12 1200   vancomycin (VANCOCIN) 500 mg in sodium chloride 0.9 % 100 mL IVPB        500 mg 100 mL/hr over 60 Minutes Intravenous Every T-Th-Sa (Hemodialysis) 07/01/12 1705     07/01/12 1900   micafungin (MYCAMINE) 100 mg in sodium chloride 0.9 % 100 mL IVPB  Status:  Discontinued        100 mg 100 mL/hr over 1 Hours Intravenous Daily 07/01/12 1551 07/02/12 1143   07/01/12 1900   piperacillin-tazobactam (ZOSYN) IVPB 2.25 g  Status:  Discontinued        2.25 g 100 mL/hr over 30 Minutes Intravenous 3 times per day 07/01/12 1705 07/02/12 1143   07/01/12 1400    piperacillin-tazobactam (ZOSYN) IVPB 3.375 g  Status:  Discontinued        3.375 g 100 mL/hr over 30 Minutes Intravenous 3 times per day 07/01/12 1023 07/01/12 1113   07/01/12 1115   piperacillin-tazobactam (ZOSYN) IVPB 3.375 g  Status:  Discontinued        3.375 g 100 mL/hr over 30 Minutes Intravenous  Once 07/01/12 1113 07/02/12 1143   07/01/12 1030   vancomycin (VANCOCIN) IVPB 1000 mg/200 mL premix        1,000 mg 200 mL/hr over 60 Minutes Intravenous  Once 07/01/12 1023 07/01/12 1318          Assessment/Plan: s/p * No surgery found * EC fistula - drain in place and this is improving - CT 10/2 showed no residual collection and drain is controlling output Suspected line sepsis - I D/W member of primary team and noted plans to remove R femoral line and further work-up is pending Diarrhea - c diff pending  LOS: 2 days    Carla Whilden E 07/03/2012

## 2012-07-03 NOTE — Procedures (Signed)
I was present at this dialysis session. I have reviewed the session itself and made appropriate changes.   Vinson Moselle, MD [employer] 07/03/2012, 11:18 AM

## 2012-07-03 NOTE — Progress Notes (Signed)
[facility]    Date of Admission:  07/01/2012   Total days of antibiotics 3        Day 3 IV vanco (HD dosed, 10/12 last dose)        ( piptazo x 1, and mica x 2 doses -d/c'd)   ID: [name] is a 51 y.o. 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 He reports not feeling well overall. No n/v, no diarrhea. He has a chronic drain for his fistula. Also with left groin AV graft for which is used for HD  Principal Problem:  *Fever Active Problems:  SBO (small bowel obstruction) s/p EL/LOA and SBR x 2  Thrombocytopenia  Gout  HTN (hypertension)  Chronic use of steroids  Malnutrition, calorie  Anemia  ESRD (end stage renal disease) on dialysis    Subjective: Febrile to 103 this morning as well as after having his tunneled femoral hickman pulled. He has associated tachycardia but no hypotension. The patient states that he felt chill earlier but is doing ok now. He is has upset stomach, having little po intake. He had 6 loose stools yesterday, but none today  Medications:     . acetaminophen      . calcium acetate  667 mg Oral TID WC  . feeding supplement  1 Container Oral TID BM  . heparin  5,000 Units Subcutaneous Q8H  . multivitamin  1 tablet Oral Daily  . pantoprazole  40 mg Oral Q1200  . predniSONE  5 mg Oral Daily  . sodium chloride  3 mL Intravenous Q12H  . vancomycin  500 mg Intravenous Q T,Th,Sa-HD  . DISCONTD: micafungin (MYCAMINE) IV  100 mg Intravenous Daily  . DISCONTD: piperacillin-tazobactam (ZOSYN)  IV  2.25 g Intravenous Q8H  . DISCONTD: piperacillin-tazobactam  3.375 g Intravenous Once    Objective: Vital signs in last 24  hours: Temp:  [98.2 F (36.8 C)-102.7 F (39.3 C)] 98.2 F (36.8 C) (10/12 0937) Pulse Rate:  [80-131] 117  (10/12 1052) Resp:  [18-29] 19  (10/12 1052) BP: (96-167)/(55-81) 126/55 mmHg (10/12 1052) SpO2:  [97 %-100 %] 97 % (10/12 0742) Weight:  [108 lb 3.9 oz (49.1 kg)-114 lb 6.7 oz (51.9 kg)] 108 lb 3.9 oz (49.1 kg) (10/12 9604) General: Awake, in chair, nad; thin and chronically ill appearing.  Skin: no rashes  Lungs: CTA B  Cor: RRR  Abdomen: surgical scar noted, no erythema. Drain in place with bilious fluid. +pulse Ext= bandage over right femoral line that is now removed. Left AV femoral graft bandaged, non-tender, no erythema   Lab Results  [facility] 07/03/12 0810 07/02/12 0610  WBC 6.1 6.2  HGB 10.4* 10.7*  HCT 31.2* 32.2*  NA 138 137  K 4.1 4.2  CL 99 97  CO2 22 24  BUN 69* 53*  CREATININE 9.42* 7.59*  GLU -- --   Liver Panel  Basename 07/02/12 0610 07/01/12 0842  PROT 5.9* 6.8  ALBUMIN 2.3* 2.7*  AST 47* 43*  ALT 32 35  ALKPHOS 156* 198*  BILITOT 0.7 1.0  BILIDIR -- --  IBILI -- --    Microbiology: 10/11 blood x 1 NGTD 10/10 blood cx 1  of 2 sets + CoNS  Studies/Results: No results found. abd ct 10/7: IMPRESSION:  1. No residual fluid collection in region of right lower quadrant  percutaneous drainage catheter. Small amount of free fluid  persists in Morison's pouch.  2. Resolution of bilateral pleural effusions.  3. Stable small left adrenal mass and left renal complex cystic  lesion.   Assessment/Plan: Recurrent fever = thought to be due to central line infection, that is now pulled. Would continue to reculture blood if temp> 100.3. Avoid giving tylenol to mask fever. He may have had fever after line pulled due to transient bacteremia/ loosened from line pull.  If he has repeated fever, would repeat blood cultures and add cefepime (renally dose) to do empiric gram negative coverage, including pseudomonas. Await for culture results to guide  therapy.   in setting of having loose stools, have low threshold to check for c.difficile. If appears to have SIRS, would empirically start c.difficile treatment  Via protocol and can discontinue once c.diff pcr is negative.   CoNS bacteremia = presumed nidus was pulled this morning. Await for further blood cultures to be positive, to decide to do further investigations such as TEE for endocarditis.  Drue Second [facility] Cell: [phone] Pager: [phone]  07/03/2012, 11:38 AM

## 2012-07-03 NOTE — Progress Notes (Signed)
Subjective: Patient noted that he just does not feel right. He feels weak . Denies any chest pain, SOB, abdominal pain but noted that he had multiple episode of diarrhea last night.  Continued to have Temp with Max of 103.1 at 12pm  Objective: Vital signs in last 24 hours: Filed Vitals:   07/03/12 1052 07/03/12 1119 07/03/12 1248 07/03/12 1700  BP: 126/55 140/66 122/54 103/52  Pulse: 117 122 120 93  Temp:  97.5 F (36.4 C) 103.1 F (39.5 C) 99.8 F (37.7 C)  TempSrc:  Oral Oral Oral  Resp: 19 26 22 21   Height:      Weight:  102 lb 1.2 oz (46.3 kg)    SpO2:  100% 100% 97%   Weight change: 9 lb 4.2 oz (4.2 kg)  Intake/Output Summary (Last 24 hours) at 07/03/12 1757 Last data filed at 07/03/12 1200  Gross per 24 hour  Intake      0 ml  Output   2852 ml  Net  -2852 ml   Physical Exam: Constitutional: Vital signs reviewed.  Patient is a well-developed and well-nourished  in no acute distress and cooperative with exam. Alert and oriented x3.   Neck: Supple,  Cardiovascular: RRR, S1 normal, S2 normal, no MRG, pulses symmetric and intact bilaterally Pulmonary/Chest: good air movement. No wheezing Abdominal: Soft. Non-tender, non-distended, bowel sounds are normal, . .  Neurological: A&O x3,  Extremities: left foot swollen, decreased DP pulse, no pitting edema, no erythema/warmth, no calf pain, no ulcerations. RLE WNL. Significant tophi present and joints   Lines  1) PICC line in right groin - no warmth, tenderness, or erythema at site.  2) Left femoral AV graft for HD catheter  3) RLQ drain from [facility] fistula - draining bilious fluid  4) R peripheral IV   Lab Results: Basic Metabolic Panel:  Lab 07/03/12 4098 07/02/12 0610  NA 138 137  K 4.1 4.2  CL 99 97  CO2 22 24  GLUCOSE 123* 79  BUN 69* 53*  CREATININE 9.42* 7.59*  CALCIUM 9.4 9.9  MG 2.0 2.0  PHOS 4.2 3.8   Liver Function Tests:  Lab 07/02/12 0610 07/01/12 0842  AST 47* 43*  ALT 32 35  ALKPHOS 156* 198*    BILITOT 0.7 1.0  PROT 5.9* 6.8  ALBUMIN 2.3* 2.7*    CBC:  Lab 07/03/12 0810 07/02/12 0610 07/01/12 0842  WBC 6.1 6.2 --  NEUTROABS -- -- 7.6  HGB 10.4* 10.7* --  HCT 31.2* 32.2* --  MCV 78.8 80.3 --  PLT 104* 102* --    CBG:  Lab 07/03/12 1245 07/03/12 0620 07/03/12 0041  GLUCAP 105* 120* 125*    Thyroid Function Tests:  Lab 07/01/12 1630  TSH 0.261*  T4TOTAL --  FREET4 --  T3FREE --  THYROIDAB --    Micro Results: Recent Results (from the past 240 hour(s))  CULTURE, BLOOD (ROUTINE X 2)     Status: Normal   Collection Time   07/01/12  8:51 AM      Component Value Range Status Comment   Specimen Description BLOOD HAND RIGHT   Final    Special Requests BOTTLES DRAWN AEROBIC AND ANAEROBIC 10CC   Final    Culture  Setup Time 07/01/2012 16:06   Final    Culture     Final    Value: STAPHYLOCOCCUS SPECIES (COAGULASE NEGATIVE)     Note: THE SIGNIFICANCE OF ISOLATING THIS ORGANISM FROM A SINGLE SET OF BLOOD CULTURES WHEN MULTIPLE  SETS ARE DRAWN IS UNCERTAIN. PLEASE NOTIFY THE MICROBIOLOGY DEPARTMENT WITHIN ONE WEEK IF SPECIATION AND SENSITIVITIES ARE REQUIRED.     11 Note: Gram Stain Report Called to,Read Back By and Verified With: [facility] COLLINS @ [phone] 07/02/12 BY KRAWS Gram Stain Report Called to,Read Back By and Verified With: KEJI ODUBERE AT 8:00PM 10 13 BY THOMI   Report Status 07/03/2012 FINAL   Final   CULTURE, BLOOD (ROUTINE X 2)     Status: Normal (Preliminary result)   Collection Time   07/01/12  9:00 AM      Component Value Range Status Comment   Specimen Description BLOOD HAND RIGHT   Final    Special Requests BOTTLES DRAWN AEROBIC AND ANAEROBIC 10CC   Final    Culture  Setup Time 07/01/2012 16:06   Final    Culture     Final    Value:        BLOOD CULTURE RECEIVED NO GROWTH TO DATE CULTURE WILL BE HELD FOR 5 DAYS BEFORE ISSUING A FINAL NEGATIVE REPORT   Report Status PENDING   Incomplete   MRSA PCR SCREENING     Status: Normal   Collection Time    07/02/12 11:05 AM      Component Value Range Status Comment   MRSA by PCR NEGATIVE  NEGATIVE Final   CULTURE, BLOOD (ROUTINE X 2)     Status: Normal (Preliminary result)   Collection Time   07/02/12  1:30 PM      Component Value Range Status Comment   Specimen Description BLOOD   Final    Special Requests     Final    Value: BOTTLES DRAWN AEROBIC AND ANAEROBIC 10CC RT FEMORAL HICKMAN   Culture  Setup Time 07/02/2012 22:49   Final    Culture     Final    Value:        BLOOD CULTURE RECEIVED NO GROWTH TO DATE CULTURE WILL BE HELD FOR 5 DAYS BEFORE ISSUING A FINAL NEGATIVE REPORT   Report Status PENDING   Incomplete    Studies/Results: Ir Removal Tun Cv Cath W/o Fl  07/03/2012  *RADIOLOGY REPORT*  Clinical Data/Indication: Infected right common femoral Hickman catheter.  TUNNEL CATHETER REMOVAL  Procedure: The procedure, risks, benefits, and alternatives were explained to the patient. Questions regarding the procedure were encouraged and answered. The patient understands and consents to the procedure.  The right groin was prepped with betadine in a sterile fashion, and a sterile drape was applied covering the operative field. A sterile gown and sterile gloves were used for the procedure.  1% lidocaine was utilized for local anesthesia. Utilizing blunt dissection, the cuff of the catheter was freed from the underlying subcutaneous tissue. The catheter was removed in its entirety. Hemostasis was achieved with direct pressure.  Complications: None.  IMPRESSION: Successful tunneled right common femoral vein Hickman catheter removal.   Original Report Authenticated By: Donavan Burnet, M.D.    Medications: I have reviewed the patient's current medications. Scheduled Meds:   . acetaminophen      . calcium acetate  667 mg Oral TID WC  . feeding supplement  1 Container Oral TID BM  . heparin  5,000 Units Subcutaneous Q8H  . multivitamin  1 tablet Oral QHS  . pantoprazole  40 mg Oral Q1200  .  predniSONE  5 mg Oral Daily  . sodium chloride  3 mL Intravenous Q12H  . vancomycin  250 mg Intravenous Q Tue-HD  . vancomycin  500 mg Intravenous Q T,Th,Sa-HD  . DISCONTD: multivitamin  1 tablet Oral Daily  . DISCONTD: vancomycin  125 mg Oral QID  . DISCONTD: vancomycin  500 mg Intravenous Q T,Th,Sa-HD   Continuous Infusions:   . DISCONTD: fat emulsion 240 mL (07/02/12 1801)  . DISCONTD: fat emulsion    . DISCONTD: TPN (CLINIMIX) +/- additives 50 mL/hr at 07/02/12 1800  . DISCONTD: TPN (CLINIMIX) +/- additives     PRN Meds:.heparin, oxyCODONE-acetaminophen Assessment/Plan:  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   1) Fever - The patient's fever is most likely 2/2 to bacteremia d/t catheter related infection. Surgery was consulted and they did not think his infection was coming from an abdominal source. CT abdomen on 10/7 showed no residual fluid collection in region of RLQ percutaneous drainage catheter or active infection. Emperic vancomycin, zosyn, and mycofungin were started on 10/10. Peripheral blood cultures x2 were drawn 10/10  grew out coagulase negative staph. PICC line was removed by IR today, 10/12. Per ID consult 10/11, zosyn and mycofungin were D/C'd and vancomycin was continued until further identification of causative organism, at which point we will begin to narrow therapy.  Cultures from his PICC line were drawn 10/11 and currently not growing any organisms. Current symptoms of loose watery stools x6 from previous day concerning for C. Diff infection. Low threshold to treat in this patient.  --F/u PICC line cultures  --If temperatures continue to spike, repeat blood cultures x2  --. Continue IV vanc. If temperatures continue to spike, will expand to include  gram negative coverage. - Will f/u C.diff PCR   2) Malnutrition - Currently receiving TNA. Albumin is 2.7 on admission and baseline prealbumin is ~ 25. Of note, < 30 mg/dl indicative of malnutrition while on HD. Surgery was consulted and they are getting a calorie count to decide whether or not to continue TPN. Appreciate their consult. Since PICC line was d/c on 10/12 plan is to take a TPN holiday and then restart TPN once the infection clears using a PICC line in the same area. Patient has endorsed several times that he does not desire receiving further TPN and primary nephrologist has said that patient told him this multiple times. After PICC line removal, it will be appropriate to speak to the patient, along with surgery and nephrology, to discuss goals of therapy. Patient has very limited sites of access. See discussion below.    3) Line management - Patient currently has right peripheral IV, a PICC in his right femoral vein, percutaneous RLQ drain for a small bowel leak, and mature left femoral AV graft for HD. 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 Per surgery, if PICC line is removed but the patient still requires TPN, will start TPN holiday with plan to reinsert PICC line at right femoral due to limited access options following antibiotic treatment.  --PICC line removed today, 10/12 by IR.   4) Unilateral LE swelling - Patient has had left lower  limb edema since d/c from the hospital in August. Dopplers at the time were negative for DVT. The major risk factor for DVT in this patient is major surgery w/in 12 weeks. However, physical exam reveals a swollen left foot  without erythema, pain, warmth, or ulceration. Low clinical suspicion for DVT. Other etiologies for this patient includes chronic venous insufficiency. LE doppler negative - f/u ABI --Encourage ambulation, foot elevation.   5) ESRD - HD was interrupted today. Patient received HD 10/12.  - HD per nephrology   6) Thrombocytopenia - The patient presents with a platelet count of 94. Currently 102. Baseline ranges from 120-161. .  --Will continue to monitor CBC and for extrahematological symptoms.  --Blood smear w/in normal limits.   7) Sinus tachycardia - Has been documented in the past, but sporadic. Most likely 2/2 to fever / underlying infection. Currently stable.   8) Hepatitis C: Hepatitis panel was reactive for Hep C in 04/2012. Hepatitis B and HIV negative. Abdominal CT with contrast in 06/28/12 was negative for liver lesions suspicious for HCC.  AFP tumor Negative  9) Gout - Currently stable, says that it improved after HD was started. Currently managed by 5mg  prednisone qDay. Allergic to allopurinol.  --Continue prednisone per primary nephrologist, Dr. Darrick Penna.   10) Anemia - likely 2/2 anemia of chronic inflammation. Iron panel in July 2013 shows low iron and UIBC and elevated ferritin.  --Continue to monitor H/H.   11) Prophylaxis:  --DVT: subcutaneous Heparin  --PUD: Protonix    LOS: 2 days   Haig Gerardo 07/03/2012, 5:57 PM

## 2012-07-03 NOTE — Procedures (Signed)
R CFV Hickman removed secondary to Fevers. No comp

## 2012-07-03 NOTE — Progress Notes (Signed)
Medical Student Daily Progress Note   Subjective:    Interval Events:  Patient spiked a fever to 100.18F overnight. Overnight team attempted to remove PICC line but patient has a Hickman catheter which requires IR to remove. Patient also had reported 6 episodes of loose bowel movements yesterday, although none overnight. Had discussion with Dr. Arlean Hopping this AM who says the patient's primary nephrologist, Dr. Arlyn Leak, believes patient's desire is to stop TNA and remove the PICC line so he can leave the SNF. Had hemodialysis this AM.     Objective:    Vital Signs:   Temp:  [98.3 F (36.8 C)-100.6 F (38.1 C)] 99 F (37.2 C) (10/12 0116) Pulse Rate:  [80-100] 100  (10/11 2213) Resp:  [18-20] 18  (10/11 2213) BP: (130-149)/(64-74) 130/64 mmHg (10/11 2213) SpO2:  [99 %-100 %] 99 % (10/11 2213) Weight:  [51.9 kg (114 lb 6.7 oz)] 51.9 kg (114 lb 6.7 oz) (10/11 2213) Last BM Date: 07/01/12   Weights: 24-hour Weight change: 4.2 kg (9 lb 4.2 oz)  Filed Weights   07/01/12 1540 07/01/12 2248 07/02/12 2213  Weight: 47.7 kg (105 lb 2.6 oz) 51.4 kg (113 lb 5.1 oz) 51.9 kg (114 lb 6.7 oz)     Intake/Output:   Intake/Output Summary (Last 24 hours) at 07/03/12 9604 Last data filed at 07/02/12 0823  Gross per 24 hour  Intake    240 ml  Output     50 ml  Net    190 ml       Physical Exam: General appearance: alert and fatigued Resp: clear to auscultation bilaterally Cardio: S1, S2 normal and no S3 or S4     Labs: Basic Metabolic Panel:  Lab 07/02/12 5409 07/01/12 0842  NA 137 140  K 4.2 3.3*  CL 97 96  CO2 24 27  GLUCOSE 79 74  BUN 53* 41*  CREATININE 7.59* 5.75*  CALCIUM 9.9 10.1  MG 2.0 --  PHOS 3.8 --    Liver Function Tests:  Lab 07/02/12 0610 07/01/12 0842  AST 47* 43*  ALT 32 35  ALKPHOS 156* 198*  BILITOT 0.7 1.0  PROT 5.9* 6.8  ALBUMIN 2.3* 2.7*   No results found for this basename: LIPASE:5,AMYLASE:5 in the last 168 hours No results found for this  basename: AMMONIA:3 in the last 168 hours  CBC:  Lab 07/02/12 0610 07/01/12 0842  WBC 6.2 8.8  NEUTROABS -- 7.6  HGB 10.7* 12.6*  HCT 32.2* 37.7*  MCV 80.3 80.6  PLT 102* 94*    Cardiac Enzymes: No results found for this basename: CKTOTAL:5,CKMB:5,CKMBINDEX:5,TROPONINI:5 in the last 168 hours  BNP (last 3 results): No results found for this basename: PROBNP:3 in the last 8760 hours  CBG:  Lab 07/03/12 0620 07/03/12 0041  GLUCAP 120* 125*    Coagulation Studies: No results found for this basename: LABPROT:5,INR:5 in the last 72 hours  Microbiology: Results for orders placed during the hospital encounter of 07/01/12  CULTURE, BLOOD (ROUTINE X 2)     Status: Normal (Preliminary result)   Collection Time   07/01/12  8:51 AM      Component Value Range Status Comment   Specimen Description BLOOD HAND RIGHT   Final    Special Requests BOTTLES DRAWN AEROBIC AND ANAEROBIC 10CC   Final    Culture  Setup Time 07/01/2012 16:06   Final    Culture     Final    Value: GRAM POSITIVE COCCI IN PAIRS  Note: Gram Stain Report Called to,Read Back By and Verified With: [facility] COLLINS @ [phone] 07/02/12 BY KRAWS   Report Status PENDING   Incomplete   CULTURE, BLOOD (ROUTINE X 2)     Status: Normal (Preliminary result)   Collection Time   07/01/12  9:00 AM      Component Value Range Status Comment   Specimen Description BLOOD HAND RIGHT   Final    Special Requests BOTTLES DRAWN AEROBIC AND ANAEROBIC 10CC   Final    Culture  Setup Time 07/01/2012 16:06   Final    Culture     Final    Value:        BLOOD CULTURE RECEIVED NO GROWTH TO DATE CULTURE WILL BE HELD FOR 5 DAYS BEFORE ISSUING A FINAL NEGATIVE REPORT   Report Status PENDING   Incomplete   MRSA PCR SCREENING     Status: Normal   Collection Time   07/02/12 11:05 AM      Component Value Range Status Comment   MRSA by PCR NEGATIVE  NEGATIVE Final     Other results:   Imaging: Dg Chest 2 View  07/01/2012  *RADIOLOGY REPORT*   Clinical Data: Weakness.  Fever.  Dialysis patient.  CHEST - 2 VIEW  Comparison: 04/28/2012.  Findings: Catheter projects over the cardiopericardial silhouette inferior approach.  The tip of the catheter is in the right atrium. There is no airspace disease or effusion.  Cardiopericardial silhouette is within normal limits.  Aortic arch atherosclerosis. No pulmonary edema or volume overload. On the lateral view, there appears to be a left anterior descending coronary artery stent.  IMPRESSION: No acute cardiopulmonary disease.  Inferior approach catheter in the right atrium.   Original Report Authenticated By: Andreas Newport, M.D.       Medications:    Infusions:    . TPN (CLINIMIX) +/- additives     And  . fat emulsion    . DISCONTD: fat emulsion 240 mL (07/02/12 1801)  . DISCONTD: TPN (CLINIMIX) +/- additives 50 mL/hr at 07/02/12 1800     Scheduled Medications:    . calcium acetate  667 mg Oral TID WC  . feeding supplement  1 Container Oral TID BM  . heparin  5,000 Units Subcutaneous Q8H  . multivitamin  1 tablet Oral Daily  . pantoprazole  40 mg Oral Q1200  . predniSONE  5 mg Oral Daily  . sodium chloride  3 mL Intravenous Q12H  . vancomycin  500 mg Intravenous Q T,Th,Sa-HD  . DISCONTD: micafungin (MYCAMINE) IV  100 mg Intravenous Daily  . DISCONTD: piperacillin-tazobactam (ZOSYN)  IV  2.25 g Intravenous Q8H  . DISCONTD: piperacillin-tazobactam  3.375 g Intravenous Once     PRN Medications: heparin, oxyCODONE-acetaminophen   Assessment/ Plan:    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   1) Fever - The patient's fever is most likely 2/2 to bacteremia d/t catheter related infection. Given his TPN administration, he is at an increased risk for  catheter-related candidemia. Surgery was consulted and they did not think his infection was coming from an abdominal source. CT abdomen on 10/7 showed no residual fluid collection in region of RLQ percutaneous drainage catheter or active infection. Emperic vancomycin, zosyn, and mycofungin were started  on 10/10. Peripheral blood cultures x2 were drawn 10/10 and one came back positive for Gram positive cocci in pairs. 10/12 grew out coagulase negative staph. PICC line was removed by IR today, 10/12. Per ID consult 10/11, zosyn and mycofungin were D/C'd and vancomycin was continued until further identification of causative organism, at which point we will begin to narrow therapy. Surgery was consulted as to whether TPN remains a requirement, please see point #2 for further discussion. Cultures from his PICC line were drawn 10/11 and currently not growing any organisms. Current symptoms of loose watery stools x6 from previous day concerning for C. Diff infection. Low threshold to treat in this patient. --F/u PICC line cultures --If temperatures continue to spike, repeat blood cultures x2 --Initated Vanc+Zosyn+Mycofungin 10/10, d/c zosyn, mycofungin 10/11. Continue IV vanc. If temperatures continue to spike, will expand to include gram negative coverage.  2) Malnutrition - Currently receiving TNA. Albumin is 2.7 on admission and baseline prealbumin is ~ 25. Of note, < 30 mg/dl indicative of malnutrition while on HD. Surgery was consulted and they are getting a calorie count to decide whether or not to continue TPN. Appreciate their consult. In the case the PICC line needs to be discontinued, plan is to take a TPN holiday and then restart TPN once the infection clears using a PICC line in the same area. Patient has endorsed several times that he does not desire receiving further TPN and primary nephrologist has said that patient told him this multiple times. After PICC line removal, it will be appropriate to speak  to the patient, along with surgery and nephrology, to discuss goals of therapy. Patient has very limited sites of access. See discussion below.  --F/u prealbumin levels.  --Continue TPN.   3) Line management - Patient currently has right peripheral IV, a PICC in his right femoral vein, percutaneous RLQ drain for a small bowel leak, and mature left femoral AV graft for HD. 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 Per surgery, if PICC line is removed but the patient still requires TPN, will start TPN holiday with plan to reinsert PICC line at right femoral due to limited access options following antibiotic treatment.  --PICC line removed today, 10/12 by IR.  4) Unilateral LE swelling - Patient has had left lower limb edema since d/c from the hospital in August. Dopplers at the time were negative for DVT. The major risk factor for DVT in this patient is major surgery w/in 12 weeks. However, physical exam reveals a swollen left foot without erythema, pain, warmth, or ulceration. Low clinical suspicion for DVT. Other etiologies for this patient includes chronic venous insufficiency.  --Dopplers of LLE to r/o DVT  --DVT prophylaxis, below  --Encourage ambulation, foot elevation.   5) ESRD - HD was interrupted today. Nephrology was consulted. Will help manage, appreciate their consult. Patient is currently on 5mg  prednisone daily, which he began after his transplant and believes he's continuing it for this reason. Dr. Arlean Hopping says that the patient's primary nephrologist is maintaining his predisone dose for treatment of gout. We will defer to his management. We appreciate their  consult.  --Patient received HD 10/12.  6) Thrombocytopenia - The patient presents with a  platelet count of 94. Currently 102. Baseline ranges from 120-161. Possible etiologies for thrombocytopenia in this patient include pseudothrombocytopenia vs. Infection vs. TTP vs ITP.  --Will continue to monitor CBC and for extrahematological symptoms.  --Blood smear w/in normal limits.   7) Sinus tachycardia - Has been documented in the past, but sporadic. Most likely 2/2 to fever / underlying infection. Currently stable.   8) Hepatitis C: Hepatitis panel was reactive for Hep C in 04/2012. Hepatitis B and HIV negative. Abdominal CT with contrast in 06/28/12 was negative for liver lesions suspicious for HCC. F/u on AFP tumor marker for monitoring for HCC, although evidence regarding the sensitivity for this test is mixed in the literature.   9) Gout - Currently stable, says that it improved after HD was started. Currently managed by 5mg  prednisone qDay. Allergic to allopurinol.  --Continue prednisone per primary nephrologist, Dr. Darrick Penna.  10) Anemia - likely 2/2 anemia of chronic inflammation. Iron panel in July 2013 shows low iron and UIBC and elevated ferritin.  --Continue to monitor H/H.   11) Prophylaxis:  --DVT: subcutaneous Heparin  --PUD: Protonix   Wynelle Link, MS4  July 02, 2012  This is a Psychologist, occupational Note.  The care of the patient was discussed with Dr. Loistine Chance and the assessment and plan formulated with their assistance.  Please see their attached note or addendum for official documentation of the daily encounter.  I have reviewed and agree with Medical student Wynelle Link. Please see my separate note .   Almyra Deforest, MD

## 2012-07-03 NOTE — Progress Notes (Signed)
Calorie Count Note  Pt meets criteria for severe malnutrition in the context of chronic illness as evidenced by 11% wt loss x 3 months and severe muscle mass and fat mass loss.  Diet: Regular Supplements: Resource Breeze TID  Friday 10/11 Breakfast: 90% of meal per pt report Lunch: 50% (356 kcal, 11 grams protein) Dinner: unknown  Sat: 10/12 Breakfast: in HD Lunch: 1/2 piece of cake, seen tray in pt's room in 6700  Pt seen in HD this am but was not feeling well. Pt was transferred to 2600 but is currently in xray. Per chart review pt with increase fevers over night. PICC removed today. Per MD pt will have a holiday from TPN to see how he does. C.diff stool studies pending.   Nutrition Dx: Increased nutrient needs r/t recent surgery and HD AEB estimated needs; ongoing.   Goal: Pt to meet >/= 90% of their estimated nutrition needs (with TPN and PO intake)  Intervention:  Encourage adequate intake from meals/supplements  Kendell Bane RD, LDN, CNSC [phone] Weekend/After Hours Pager

## 2012-07-03 NOTE — Progress Notes (Signed)
ANTIBIOTIC CONSULT NOTE - FOLLOW UP  Pharmacy Consult for Vancomycin Indication: Coag negative Staph Bacteremia  Allergies  Allergen Reactions  . Allopurinol     REACTION: decreased platelets  . Aspirin     REACTION: unspecified    Patient Measurements: Height: 6' (182.9 cm) Weight: 102 lb 1.2 oz (46.3 kg) IBW/kg (Calculated) : 77.6   Vital Signs: Temp: 103.1 F (39.5 C) (10/12 1248) Temp src: Oral (10/12 1248) BP: 122/54 mmHg (10/12 1248) Pulse Rate: 120  (10/12 1248) Intake/Output from previous day: 10/11 0701 - 10/12 0700 In: 240 [P.O.:240] Out: 50 [Drains:50] Intake/Output from this shift: Total I/O In: 0  Out: 2852 [Other:2852]  Labs:  [facility] 07/03/12 0810 07/02/12 0610 07/01/12 0842  WBC 6.1 6.2 8.8  HGB 10.4* 10.7* 12.6*  PLT 104* 102* 94*  LABCREA -- -- --  CREATININE 9.42* 7.59* 5.75*   Estimated Creatinine Clearance: 5.9 ml/min (by C-G formula based on Cr of 9.42). No results found for this basename: VANCOTROUGH:2,VANCOPEAK:2,VANCORANDOM:2,GENTTROUGH:2,GENTPEAK:2,GENTRANDOM:2,TOBRATROUGH:2,TOBRAPEAK:2,TOBRARND:2,AMIKACINPEAK:2,AMIKACINTROU:2,AMIKACIN:2, in the last 72 hours   Microbiology: Recent Results (from the past 720 hour(s))  CULTURE, BLOOD (ROUTINE X 2)     Status: Normal   Collection Time   07/01/12  8:51 AM      Component Value Range Status Comment   Specimen Description BLOOD HAND RIGHT   Final    Special Requests BOTTLES DRAWN AEROBIC AND ANAEROBIC 10CC   Final    Culture  Setup Time 07/01/2012 16:06   Final    Culture     Final    Value: STAPHYLOCOCCUS SPECIES (COAGULASE NEGATIVE)     Note: THE SIGNIFICANCE OF ISOLATING THIS ORGANISM FROM A SINGLE SET OF BLOOD CULTURES WHEN MULTIPLE SETS ARE DRAWN IS UNCERTAIN. PLEASE NOTIFY THE MICROBIOLOGY DEPARTMENT WITHIN ONE WEEK IF SPECIATION AND SENSITIVITIES ARE REQUIRED.     11 Note: Gram Stain Report Called to,Read Back By and Verified With: [facility] COLLINS @ [phone] 07/02/12 BY KRAWS Gram Stain  Report Called to,Read Back By and Verified With: KEJI ODUBERE AT 8:00PM 10 13 BY THOMI   Report Status 07/03/2012 FINAL   Final   CULTURE, BLOOD (ROUTINE X 2)     Status: Normal (Preliminary result)   Collection Time   07/01/12  9:00 AM      Component Value Range Status Comment   Specimen Description BLOOD HAND RIGHT   Final    Special Requests BOTTLES DRAWN AEROBIC AND ANAEROBIC 10CC   Final    Culture  Setup Time 07/01/2012 16:06   Final    Culture     Final    Value:        BLOOD CULTURE RECEIVED NO GROWTH TO DATE CULTURE WILL BE HELD FOR 5 DAYS BEFORE ISSUING A FINAL NEGATIVE REPORT   Report Status PENDING   Incomplete   MRSA PCR SCREENING     Status: Normal   Collection Time   07/02/12 11:05 AM      Component Value Range Status Comment   MRSA by PCR NEGATIVE  NEGATIVE Final   CULTURE, BLOOD (ROUTINE X 2)     Status: Normal (Preliminary result)   Collection Time   07/02/12  1:30 PM      Component Value Range Status Comment   Specimen Description BLOOD   Final    Special Requests     Final    Value: BOTTLES DRAWN AEROBIC AND ANAEROBIC 10CC RT FEMORAL HICKMAN   Culture  Setup Time 07/02/2012 22:49   Final    Culture  Final    Value:        BLOOD CULTURE RECEIVED NO GROWTH TO DATE CULTURE WILL BE HELD FOR 5 DAYS BEFORE ISSUING A FINAL NEGATIVE REPORT   Report Status PENDING   Incomplete     Anti-infectives     Start     Dose/Rate Route Frequency Ordered Stop   07/03/12 1400   vancomycin (VANCOCIN) 50 mg/mL oral solution 125 mg  Status:  Discontinued        125 mg Oral 4 times daily 07/03/12 1332 07/03/12 1353   07/03/12 1200   vancomycin (VANCOCIN) 500 mg in sodium chloride 0.9 % 100 mL IVPB        500 mg 100 mL/hr over 60 Minutes Intravenous Every T-Th-Sa (Hemodialysis) 07/01/12 1705     07/01/12 1900   micafungin (MYCAMINE) 100 mg in sodium chloride 0.9 % 100 mL IVPB  Status:  Discontinued        100 mg 100 mL/hr over 1 Hours Intravenous Daily 07/01/12 1551 07/02/12  1143   07/01/12 1900   piperacillin-tazobactam (ZOSYN) IVPB 2.25 g  Status:  Discontinued        2.25 g 100 mL/hr over 30 Minutes Intravenous 3 times per day 07/01/12 1705 07/02/12 1143   07/01/12 1400   piperacillin-tazobactam (ZOSYN) IVPB 3.375 g  Status:  Discontinued        3.375 g 100 mL/hr over 30 Minutes Intravenous 3 times per day 07/01/12 1023 07/01/12 1113   07/01/12 1115   piperacillin-tazobactam (ZOSYN) IVPB 3.375 g  Status:  Discontinued        3.375 g 100 mL/hr over 30 Minutes Intravenous  Once 07/01/12 1113 07/02/12 1143   07/01/12 1030   vancomycin (VANCOCIN) IVPB 1000 mg/200 mL premix        1,000 mg 200 mL/hr over 60 Minutes Intravenous  Once 07/01/12 1023 07/01/12 1318          Assessment: 51 y.o. M on Vancomycin for empiric coverage of CoNS bacteremia (in 1/2 BCx on 10/10) thought to be due to central line placed for home TNA. Pt still spiking fevers. ID on board, getting repeat BCx. Low threshold for adding gram negative coverage if patient continues to spike fevers.   The patient only signed off of HD early today and only had a 2.5 hr session, therefore will lower next Vancomycin maintenance dose slightly to account for this. Plan is to keep patient on home T/Th/Sat HD schedule  Goal of Therapy:  Pre-HD Vancomycin level of 15-25 mcg/ml Proper antibiotics for infection/cultures adjusted for renal/hepatic function   Plan:  1. Vancomycin 250 mg post next HD session on Tues, 10/15 2. Continue Vancomycin 500 mg post HD sessions on T/Th/Sat (starting on 10/17) 3. Will continue to follow HD schedule/duration, culture results, LOT, and antibiotic de-escalation plans   Georgina Pillion, PharmD, BCPS Clinical Pharmacist Pager: [phone] 07/03/2012 3:17 PM

## 2012-07-04 ENCOUNTER — Encounter (HOSPITAL_COMMUNITY): Payer: Self-pay | Admitting: *Deleted

## 2012-07-04 DIAGNOSIS — M79609 Pain in unspecified limb: Secondary | ICD-10-CM

## 2012-07-04 DIAGNOSIS — R7881 Bacteremia: Secondary | ICD-10-CM | POA: Diagnosis present

## 2012-07-04 LAB — CBC
HCT: 33.7 % — ABNORMAL LOW (ref 39.0–52.0)
Hemoglobin: 11.2 g/dL — ABNORMAL LOW (ref 13.0–17.0)
MCH: 26.7 pg (ref 26.0–34.0)
MCHC: 33.2 g/dL (ref 30.0–36.0)
MCV: 80.4 fL (ref 78.0–100.0)
RBC: 4.19 MIL/uL — ABNORMAL LOW (ref 4.22–5.81)

## 2012-07-04 LAB — BASIC METABOLIC PANEL
BUN: 34 mg/dL — ABNORMAL HIGH (ref 6–23)
CO2: 26 mEq/L (ref 19–32)
Chloride: 98 mEq/L (ref 96–112)
GFR calc non Af Amer: 9 mL/min — ABNORMAL LOW (ref >90)
Glucose, Bld: 101 mg/dL — ABNORMAL HIGH (ref 70–99)
Potassium: 3.9 mEq/L (ref 3.5–5.1)
Sodium: 139 mEq/L (ref 135–145)

## 2012-07-04 LAB — GLUCOSE, CAPILLARY: Glucose-Capillary: 111 mg/dL — ABNORMAL HIGH (ref 70–99)

## 2012-07-04 NOTE — Progress Notes (Addendum)
[facility]    Date of Admission:  07/01/2012   Total days of antibiotics 4        Day 4 IV vanco (HD dosed, 10/12 last dose)        ( piptazo x 1, and mica x 2 doses -d/c'd)   ID: [name] is a 51 y.o. 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 He reports not feeling well overall. No n/v, no diarrhea. He has a chronic drain for his fistula. Also with left groin AV graft for which is used for HD  Active Problems:  SBO (small bowel obstruction) s/p EL/LOA and SBR x 2  Thrombocytopenia  Gout  HTN (hypertension)  Chronic use of steroids  Malnutrition, calorie  Anemia  ESRD (end stage renal disease) on dialysis  Fever    Subjective: He is feeling better. No longer having chills. Early satiety unable to finish lunch. Only 1 formed stool today. No nausea/vomiting, fevers.  24hr events: AFebrile since last fever after having femoral tunnel catheter pulled. He was transferred to step down for further observations but has remained stable since yesterday afternoon. No further fevers, no hypotension, hypoxia, had tachycardia in setting of fever. No longer having loose stools.  Medications:     . calcium acetate  667 mg Oral TID WC  . feeding supplement  1 Container Oral TID BM  . heparin  5,000 Units Subcutaneous Q8H  . multivitamin  1 tablet Oral QHS  . pantoprazole  40 mg Oral Q1200  . predniSONE  5 mg Oral Daily  . sodium chloride  3 mL Intravenous Q12H  . vancomycin  250 mg Intravenous Q Tue-HD  . vancomycin  500 mg Intravenous Q T,Th,Sa-HD  . DISCONTD: multivitamin  1 tablet Oral Daily  . DISCONTD: vancomycin  125 mg Oral QID  . DISCONTD:  vancomycin  500 mg Intravenous Q T,Th,Sa-HD    Objective: Vital signs in last 24 hours: Temp:  [97.5 F (36.4 C)-103.1 F (39.5 C)] 98.6 F (37 C) (10/13 0814) Pulse Rate:  [77-131] 85  (10/13 0814) Resp:  [13-37] 13  (10/13 0814) BP: (96-140)/(52-66) 140/60 mmHg (10/13 0814) SpO2:  [80 %-100 %] 95 % (10/13 0814) Weight:  [100 lb 1.4 oz (45.4 kg)-102 lb 4.7 oz (46.4 kg)] 100 lb 1.4 oz (45.4 kg) (10/13 0500) General: Awake,sitting up in bed. nad; thin and chronically ill appearing.  Skin: no rashes  Lungs: CTA B  Cor: RRR  Abdomen: surgical scar noted, no erythema. Drain in place with bilious fluid. +pulse Ext= bandage over right femoral line that is now removed. Left AV femoral graft bandaged, non-tender, no erythema   Lab Results  [facility] 07/04/12 0415 07/03/12 0810  WBC 6.9 6.1  HGB 11.2* 10.4*  HCT 33.7* 31.2*  NA 139 138  K 3.9 4.1  CL 98 99  CO2 26 22  BUN 34* 69*  CREATININE 6.36* 9.42*  GLU -- --   Liver Panel  Basename 07/02/12 0610  PROT 5.9*  ALBUMIN 2.3*  AST 47*  ALT 32  ALKPHOS 156*  BILITOT 0.7  BILIDIR --  IBILI --    Microbiology: 10/11 blood x 1 NGTD 10/10 blood  cx 1 of 2 sets + CoNS  Studies/Results: Dg Chest 2 View  07/03/2012  *RADIOLOGY REPORT*  Clinical Data: Fever.  CHEST - 2 VIEW  Comparison: 07/01/2012  Findings: Pulmonary hyperinflation again seen.  Both lungs are clear.  Heart size is at upper limits of normal in stable.  No mass or lymphadenopathy identified.  IMPRESSION: Stable exam.  No active disease.   Original Report Authenticated By: Danae Orleans, M.D.    Ir Removal Tun Cv Cath W/o Fl  07/03/2012  *RADIOLOGY REPORT*  Clinical Data/Indication: Infected right common femoral Hickman catheter.  TUNNEL CATHETER REMOVAL  Procedure: The procedure, risks, benefits, and alternatives were explained to the patient. Questions regarding the procedure were encouraged and answered. The patient understands and consents to the procedure.   The right groin was prepped with betadine in a sterile fashion, and a sterile drape was applied covering the operative field. A sterile gown and sterile gloves were used for the procedure.  1% lidocaine was utilized for local anesthesia. Utilizing blunt dissection, the cuff of the catheter was freed from the underlying subcutaneous tissue. The catheter was removed in its entirety. Hemostasis was achieved with direct pressure.  Complications: None.  IMPRESSION: Successful tunneled right common femoral vein Hickman catheter removal.   Original Report Authenticated By: Donavan Burnet, M.D.    abd ct 10/7: IMPRESSION:  1. No residual fluid collection in region of right lower quadrant  percutaneous drainage catheter. Small amount of free fluid  persists in Morison's pouch.  2. Resolution of bilateral pleural effusions.  3. Stable small left adrenal mass and left renal complex cystic  lesion.   Assessment/Plan: Recurrent fever = thought to be due to central line infection, that is now pulled. Afebrile for nearly 24 hrs. Would continue to reculture blood if temp> 100.3.   If he has repeated fever, would repeat blood cultures and add cefepime (renally dose) to do empiric gram negative coverage, including pseudomonas. Await for culture results to guide therapy.  in setting of having loose stools, have low threshold to check for c.difficile. If he appears to have SIRS, would empirically start c.difficile treatment  Via protocol and can discontinue once c.diff pcr is negative.   CoNS bacteremia = presumed nidus was pulled ton 10/12. Await for further blood cultures to be positive, to decide to do further investigations such as TEE for endocarditis.thus far, no new cultures. [facility] will provide sensitivities on CoNS in 2 days. Needs treatment for 14 days for bacteremia; however, if he has subsequent positive blood cultures, will need work up for endocarditis.  Fistula/malnutrition= per surgery, he still  will need TPN/PPN in order to help heal the remaining fistula. New line maybe placed once we have documented that he is no longer bacteremic.   [facility] Cell: [phone] Pager: [phone]  07/04/2012, 10:04 AM

## 2012-07-04 NOTE — Progress Notes (Signed)
VASCULAR LAB PRELIMINARY  ARTERIAL  ABI completed:    RIGHT    LEFT    PRESSURE WAVEFORM  PRESSURE WAVEFORM  BRACHIAL 167 Triphasic BRACHIAL 187 Triphasic  DP >307 Biphasic DP >306 Biphasic         PT >305 Biphasic PT >314 Triphasic         GREAT TOE 52  GREAT TOE 50     RIGHT LEFT  ABI >1.64 >1.68   Bilateral - ABIs could not be ascertained due to incompressible vessels probably secondary to calcification. Doppler waveforms are within normal limits. Great toe pressures bilaterally indicate adequate perfusion. Doppler waveforms appear within normal limits.  Eugena Rhue, RVS 07/04/2012, 9:59 AM

## 2012-07-04 NOTE — Progress Notes (Signed)
Medical Student Daily Progress Note   Subjective:    Interval Events:  Patient continued to spike fevers yesterday morning, with a temperature of 102.35F at 7:42AM and peaking to 103.38F after his PICC was removed at 12:48PM. Assessment of fevers was complicated by nurse administration of tylenol during this period. During this time, he also remained tachycardic (101-131) and his respiration rate ranged from 21-37. Since 10/11, the patient has not had any bowel movements, although during our conversation he endorsed that he will use the bathroom for a BM after we finish talking. The patient did not have any acute overnight events. He says that he slept well. He was able to ambulate around his yesterday. Palpitations are transient, and also occur outside the hospital. He denies any bowel urgency. No shaking chills overnight.    Objective:    Vital Signs:   Temp:  [97.5 F (36.4 C)-103.1 F (39.5 C)] 98.6 F (37 C) (10/13 0814) Pulse Rate:  [77-131] 85  (10/13 0814) Resp:  [13-37] 13  (10/13 0814) BP: (96-150)/(52-71) 140/60 mmHg (10/13 0814) SpO2:  [80 %-100 %] 95 % (10/13 0814) Weight:  [45.4 kg (100 lb 1.4 oz)-46.4 kg (102 lb 4.7 oz)] 45.4 kg (100 lb 1.4 oz) (10/13 0500) Last BM Date: 07/01/12   Weights: 24-hour Weight change: -2.8 kg (-6 lb 2.8 oz)  Filed Weights   07/03/12 1119 07/03/12 2100 07/04/12 0500  Weight: 46.3 kg (102 lb 1.2 oz) 46.4 kg (102 lb 4.7 oz) 45.4 kg (100 lb 1.4 oz)     Intake/Output:   Intake/Output Summary (Last 24 hours) at 07/04/12 0913 Last data filed at 07/04/12 0600  Gross per 24 hour  Intake      0 ml  Output   2874 ml  Net  -2874 ml       Physical Exam: General appearance: alert, cooperative, cachectic and no distress Resp: clear to auscultation bilaterally Cardio: regular rate and rhythm, S1, S2 normal, no murmur, click, rub or gallop GI: soft, non-tender; bowel sounds normal; no masses,  no organomegaly    Labs: Basic Metabolic  Panel:  Lab 07/04/12 0415 07/03/12 0810 07/02/12 0610 07/01/12 0842  NA 139 138 137 140  K 3.9 4.1 4.2 3.3*  CL 98 99 97 96  CO2 26 22 24 27   GLUCOSE 101* 123* 79 74  BUN 34* 69* 53* 41*  CREATININE 6.36* 9.42* 7.59* 5.75*  CALCIUM 9.9 9.4 9.9 --  MG -- 2.0 2.0 --  PHOS -- 4.2 3.8 --    Liver Function Tests:  Lab 07/02/12 0610 07/01/12 0842  AST 47* 43*  ALT 32 35  ALKPHOS 156* 198*  BILITOT 0.7 1.0  PROT 5.9* 6.8  ALBUMIN 2.3* 2.7*   No results found for this basename: LIPASE:5,AMYLASE:5 in the last 168 hours No results found for this basename: AMMONIA:3 in the last 168 hours  CBC:  Lab 07/04/12 0415 07/03/12 0810 07/02/12 0610 07/01/12 0842  WBC 6.9 6.1 6.2 8.8  NEUTROABS -- -- -- 7.6  HGB 11.2* 10.4* 10.7* 12.6*  HCT 33.7* 31.2* 32.2* 37.7*  MCV 80.4 78.8 80.3 80.6  PLT 116* 104* 102* 94*    Cardiac Enzymes: No results found for this basename: CKTOTAL:5,CKMB:5,CKMBINDEX:5,TROPONINI:5 in the last 168 hours  BNP (last 3 results): No results found for this basename: PROBNP:3 in the last 8760 hours  CBG:  Lab 07/03/12 1851 07/03/12 1245 07/03/12 0620 07/03/12 0041  GLUCAP 161* 105* 120* 125*    Coagulation Studies: No results  found for this basename: LABPROT:5,INR:5 in the last 72 hours  Microbiology: Results for orders placed during the hospital encounter of 07/01/12  CULTURE, BLOOD (ROUTINE X 2)     Status: Normal   Collection Time   07/01/12  8:51 AM      Component Value Range Status Comment   Specimen Description BLOOD HAND RIGHT   Final    Special Requests BOTTLES DRAWN AEROBIC AND ANAEROBIC 10CC   Final    Culture  Setup Time 07/01/2012 16:06   Final    Culture     Final    Value: STAPHYLOCOCCUS SPECIES (COAGULASE NEGATIVE)     Note: THE SIGNIFICANCE OF ISOLATING THIS ORGANISM FROM A SINGLE SET OF BLOOD CULTURES WHEN MULTIPLE SETS ARE DRAWN IS UNCERTAIN. PLEASE NOTIFY THE MICROBIOLOGY DEPARTMENT WITHIN ONE WEEK IF SPECIATION AND SENSITIVITIES ARE  REQUIRED.     11 Note: Gram Stain Report Called to,Read Back By and Verified With: [facility] COLLINS @ [phone] 07/02/12 BY KRAWS Gram Stain Report Called to,Read Back By and Verified With: KEJI ODUBERE AT 8:00PM 10 13 BY THOMI   Report Status 07/03/2012 FINAL   Final   CULTURE, BLOOD (ROUTINE X 2)     Status: Normal (Preliminary result)   Collection Time   07/01/12  9:00 AM      Component Value Range Status Comment   Specimen Description BLOOD HAND RIGHT   Final    Special Requests BOTTLES DRAWN AEROBIC AND ANAEROBIC 10CC   Final    Culture  Setup Time 07/01/2012 16:06   Final    Culture     Final    Value:        BLOOD CULTURE RECEIVED NO GROWTH TO DATE CULTURE WILL BE HELD FOR 5 DAYS BEFORE ISSUING A FINAL NEGATIVE REPORT   Report Status PENDING   Incomplete   MRSA PCR SCREENING     Status: Normal   Collection Time   07/02/12 11:05 AM      Component Value Range Status Comment   MRSA by PCR NEGATIVE  NEGATIVE Final   CULTURE, BLOOD (ROUTINE X 2)     Status: Normal (Preliminary result)   Collection Time   07/02/12  1:30 PM      Component Value Range Status Comment   Specimen Description BLOOD   Final    Special Requests     Final    Value: BOTTLES DRAWN AEROBIC AND ANAEROBIC 10CC RT FEMORAL HICKMAN   Culture  Setup Time 07/02/2012 22:49   Final    Culture     Final    Value:        BLOOD CULTURE RECEIVED NO GROWTH TO DATE CULTURE WILL BE HELD FOR 5 DAYS BEFORE ISSUING A FINAL NEGATIVE REPORT   Report Status PENDING   Incomplete     Other results:   Imaging: Dg Chest 2 View  07/03/2012  *RADIOLOGY REPORT*  Clinical Data: Fever.  CHEST - 2 VIEW  Comparison: 07/01/2012  Findings: Pulmonary hyperinflation again seen.  Both lungs are clear.  Heart size is at upper limits of normal in stable.  No mass or lymphadenopathy identified.  IMPRESSION: Stable exam.  No active disease.   Original Report Authenticated By: Danae Orleans, M.D.    Ir Removal Tun Cv Cath W/o Fl  07/03/2012  *RADIOLOGY  REPORT*  Clinical Data/Indication: Infected right common femoral Hickman catheter.  TUNNEL CATHETER REMOVAL  Procedure: The procedure, risks, benefits, and alternatives were explained to the patient. Questions regarding  the procedure were encouraged and answered. The patient understands and consents to the procedure.  The right groin was prepped with betadine in a sterile fashion, and a sterile drape was applied covering the operative field. A sterile gown and sterile gloves were used for the procedure.  1% lidocaine was utilized for local anesthesia. Utilizing blunt dissection, the cuff of the catheter was freed from the underlying subcutaneous tissue. The catheter was removed in its entirety. Hemostasis was achieved with direct pressure.  Complications: None.  IMPRESSION: Successful tunneled right common femoral vein Hickman catheter removal.   Original Report Authenticated By: Donavan Burnet, M.D.       Medications:    Infusions:    . DISCONTD: fat emulsion    . DISCONTD: TPN (CLINIMIX) +/- additives       Scheduled Medications:    . calcium acetate  667 mg Oral TID WC  . feeding supplement  1 Container Oral TID BM  . heparin  5,000 Units Subcutaneous Q8H  . multivitamin  1 tablet Oral QHS  . pantoprazole  40 mg Oral Q1200  . predniSONE  5 mg Oral Daily  . sodium chloride  3 mL Intravenous Q12H  . vancomycin  250 mg Intravenous Q Tue-HD  . vancomycin  500 mg Intravenous Q T,Th,Sa-HD  . DISCONTD: multivitamin  1 tablet Oral Daily  . DISCONTD: vancomycin  125 mg Oral QID  . DISCONTD: vancomycin  500 mg Intravenous Q T,Th,Sa-HD     PRN Medications: heparin, oxyCODONE-acetaminophen   Assessment/ Plan:    Mr. 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   1) Fever - The  patient's fever is most likely 2/2 to bacteremia d/t catheter related infection. Given his TPN administration, he is at an increased risk for catheter-related candidemia. Surgery was consulted and they did not think his infection was coming from an abdominal source. CT abdomen on 10/7 showed no residual fluid collection in region of RLQ percutaneous drainage catheter or active infection. Emperic vancomycin, zosyn, and mycofungin were started on 10/10. Peripheral blood cultures x2 were drawn 10/10 and one came back positive for Gram positive cocci in pairs and on 10/12, grew out coagulase negative staph. Cultures from his PICC line were drawn 10/11 and have NGTD x1d. Per ID, Zosyn and mycofungin were d/c'd 10/11 and IV vancomycin was continued. He is on day 3 of total vancomycin coverage and day 1 s/p PICC line removal on 10/12. The literature supports a 5-7d course of vancomycin treatment of CoN staph after catheter removal. We will plan to treat for 4 more days. Surgery was consulted as to whether TPN remains a requirement, please see point #2 for further discussion. Although patient had symptoms of loose watery stools x6 on 10/11, my concern for C. Diff infection is diminished given current clinical picture and time course (no bowel urgency, no rising WBC count, afebrile). Low threshold to treat in this patient.  --F/u PICC and peripheral BCx --If temperatures continue to spike, repeat blood cultures x2  --Initated Vanc+Zosyn+Mycofungin 10/10, d/c zosyn, mycofungin 10/11. Continue IV vanc, day 1/5.   2) Malnutrition - Currently receiving TNA. Albumin is 2.7 on admission and baseline prealbumin is ~ 25. Of note, < 30 mg/dl indicative of malnutrition while on HD. Surgery was  consulted about TPN cessation. Following PICC removal, surgery had initially planned to place another PICC line in the same area and restart TPN. However, the patient has endorsed to me several times that he does not desire receiving  further TPN as this does not permit him to return to living with his sister. His primary nephrologist has confirmed these sentiments, saying that the patient told him this multiple times. After PICC line removal, it will be appropriate to speak to the patient, along with surgery and nephrology, to discuss goals of therapy. Patient has very limited sites of access. See discussion below.  --Prealbumin at 20.6.  --d/cTPN 10/12.  --Encouraged patient to eat as much as he could of whatever he found appetizing. On a full diet.  3) Line management - Patient currently has right peripheral IV, a PICC in his right femoral vein, percutaneous RLQ drain for a small bowel leak, and mature left femoral AV graft for HD. 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 Per surgery, if PICC line is removed but the patient still requires TPN, will start TPN holiday with plan to reinsert PICC line at right femoral due to limited access options following antibiotic treatment.  --PICC line removed today, 10/12 by IR. No complications.  4) Unilateral LE swelling and diminished pulse - Patient has had left lower foot edema since d/c from the hospital in August. Dopplers at the time were negative for DVT. The major risk factor for DVT in this patient is major surgery w/in 12 weeks. However, physical exam reveals a swollen left foot without erythema, pain, warmth, or ulceration. Low clinical suspicion for DVT. Dopplers on 10/11 were negative for DVT. Ankle-brachial index was WNL, ruling out arterial etiologies that could cause weakened pulse. A central venogram on 11/07/11 showed wide patency of his left iliac  venous system and IVC. Etiologies for this patient includes chronic venous insufficiency of his deep venous system.  --DVT prophylaxis, below  --Compression stockings, encourage ambulation, foot elevation.   5) ESRD - HD was interrupted today. Nephrology was consulted. Will help manage, appreciate their consult. Patient is currently on 5mg  prednisone daily, which he began after his transplant and believes he's continuing it for this reason. Dr. Arlean Hopping says that the patient's primary nephrologist is maintaining his predisone dose for treatment of gout. We will defer to his management. We appreciate their consult.  --Patient received HD 10/12.   6) Thrombocytopenia - The patient presents with a platelet count of 94. Currently 102. Baseline ranges from 120-161. Possible etiologies for thrombocytopenia in this patient include pseudothrombocytopenia vs. Infection vs. TTP vs ITP.  --Will continue to monitor CBC and for extrahematological symptoms.  --Blood smear w/in normal limits.   7) Sinus tachycardia - Has been documented in the past, but sporadic. Most likely 2/2 to fever / underlying infection. Currently stable.   8) Hepatitis C: Hepatitis panel was reactive for Hep C in 04/2012. Hepatitis B and HIV negative. Abdominal CT with contrast in 06/28/12 was negative for liver lesions suspicious for HCC. AFP tumor marker was WNL, although evidence regarding the sensitivity for this test in the screening for [facility] development is mixed in the literature.   9) Gout - Currently stable, says  that it improved after HD was started. Currently managed by 5mg  prednisone qDay. Allergic to allopurinol.  --Continue prednisone per primary nephrologist, Dr. Darrick Penna.   10) Anemia - likely 2/2 anemia of chronic inflammation. Iron panel in July 2013 shows low iron and UIBC and elevated ferritin.  --Continue to monitor H/H.   11) Prophylaxis:  --DVT: subcutaneous Heparin  --PUD: Protonix   LOS: 3 days  Wynelle Link,  MS4  July 04, 2012    This is a Psychologist, occupational Note.  The care of the patient was discussed with Dr. Loistine Chance and the assessment and plan formulated with their assistance.  Please see their attached note or addendum for official documentation of the daily encounter.  I have reviewed and agree with Medical student Wynelle Link. Please see my separate note .

## 2012-07-04 NOTE — Evaluation (Signed)
Physical Therapy Evaluation Patient Details Name: [name] MRN: [id number] DOB: 08/28/1961 Today's Date: 07/04/2012 Time: 8657-8469 PT Time Calculation (min): 17 min  PT Assessment / Plan / Recommendation Clinical Impression  Patient is a 51 yo male admitted with fever - PICC lin infection, and ESRD.  Patient independent with all mobility and gait.  Encouraged patient to ambulate in hallway with nursing daily to improve endurance.  No skilled acute PT services indicated.  PT will sign off.    PT Assessment  Patent does not need any further PT services    Follow Up Recommendations  No PT follow up    Does the patient have the potential to tolerate intense rehabilitation      Barriers to Discharge        Equipment Recommendations  None recommended by PT    Recommendations for Other Services     Frequency      Precautions / Restrictions Precautions Precautions: None Restrictions Weight Bearing Restrictions: No   Pertinent Vitals/Pain       Mobility  Bed Mobility Bed Mobility: Supine to Sit;Sit to Supine Supine to Sit: 7: Independent;HOB flat Sit to Supine: 7: Independent;HOB flat Details for Bed Mobility Assistance: No cues or assist needed Transfers Transfers: Sit to Stand;Stand to Sit Sit to Stand: 7: Independent;With upper extremity assist;From bed Stand to Sit: 7: Independent;To bed Details for Transfer Assistance: No cues or assist needed Ambulation/Gait Ambulation/Gait Assistance: 5: Supervision Ambulation Distance (Feet): 250 Feet Assistive device: None Ambulation/Gait Assistance Details: Patient able to ambulate without assistive device safely.  Encouraged use of RW when ambulating with nursing.  Patient without loss of balance during gait.  Patient with slow, steady gait pattern. Gait Pattern: Step-through pattern;Decreased stride length;Trunk flexed Gait velocity: Slow gait speed      PT Goals  N/A  Visit Information  Last PT Received On:  07/04/12 Assistance Needed: +1    Subjective Data  Subjective: "I've been up in my room"   Patient Stated Goal: To be able to return home   Prior Functioning  Home Living Lives With: Other (Comment) (Patient at SNF) Available Help at Discharge: Skilled Nursing Facility Home Adaptive Equipment: Walker - rolling;Wheelchair - Doctor, general practice Prior Function Level of Independence: Independent with assistive device(s) (Uses cane/walker/wheelchair as needed) Able to Take Stairs?: Yes Driving: No Vocation: On disability Communication Communication: No difficulties    Cognition  Overall Cognitive Status: Appears within functional limits for tasks assessed/performed Arousal/Alertness: Awake/alert Orientation Level: Appears intact for tasks assessed Behavior During Session: [facility] for tasks performed    Extremity/Trunk Assessment Right Upper Extremity Assessment RUE ROM/Strength/Tone: [facility] for tasks assessed Left Upper Extremity Assessment LUE ROM/Strength/Tone: WFL for tasks assessed Right Lower Extremity Assessment RLE ROM/Strength/Tone: [facility] for tasks assessed (General weakness noted throughout) Left Lower Extremity Assessment LLE ROM/Strength/Tone: WFL for tasks assessed   Balance Balance Balance Assessed: Yes High Level Balance High Level Balance Activites: Direction changes;Turns;Head turns;Sudden stops High Level Balance Comments: Patient without loss of balance with high level balance activities.  End of Session PT - End of Session Equipment Utilized During Treatment: Gait belt Activity Tolerance: Patient tolerated treatment well (Minimal fatigue) Patient left: in bed;with call bell/phone within reach Nurse Communication: Mobility status (Encouraged patient to ambulate in hallway with nursing daily)  GP     Vena Austria 07/04/2012, 4:52 PM Durenda Hurt. Renaldo Fiddler, [facility] Acute Rehab Services Pager [phone]

## 2012-07-04 NOTE — Progress Notes (Signed)
Subjective: Feels a lot better. No Fever since 12 pm 10/12. No BM since yesterday am. Denies any chest pain, SOB, abdominal pain, nausea or vomiting.  Objective: Vital signs in last 24 hours: Filed Vitals:   07/04/12 0800 07/04/12 0814 07/04/12 0900 07/04/12 1000  BP:  140/60    Pulse: 83 85 96 89  Temp:  98.6 F (37 C)    TempSrc:  Oral    Resp: 16 13 20 18   Height:      Weight:      SpO2: 99% 95% 99% 99%   Weight change: -6 lb 2.8 oz (-2.8 kg)  Intake/Output Summary (Last 24 hours) at 07/04/12 1057 Last data filed at 07/04/12 0600  Gross per 24 hour  Intake      0 ml  Output   2874 ml  Net  -2874 ml   Physical Exam: Constitutional: Vital signs reviewed.  Patient is a well-developed and well-nourished  in no acute distress and cooperative with exam. Alert and oriented x3.  Head: Normocephalic and atraumatic Cardiovascular: RRR, S1 normal, S2 normal, no MRG, pulses symmetric and intact bilaterally Pulmonary/Chest: CTAB, no wheezes, rales, or rhonchi Abdominal: Soft. Non-tender, non-distended, bowel sounds are normal,  Neurological: A&O x3,  Lab Results: Basic Metabolic Panel:  Lab 07/04/12 1660 07/03/12 0810 07/02/12 0610  NA 139 138 --  K 3.9 4.1 --  CL 98 99 --  CO2 26 22 --  GLUCOSE 101* 123* --  BUN 34* 69* --  CREATININE 6.36* 9.42* --  CALCIUM 9.9 9.4 --  MG -- 2.0 2.0  PHOS -- 4.2 3.8   Liver Function Tests:  Lab 07/02/12 0610 07/01/12 0842  AST 47* 43*  ALT 32 35  ALKPHOS 156* 198*  BILITOT 0.7 1.0  PROT 5.9* 6.8  ALBUMIN 2.3* 2.7*    CBC:  Lab 07/04/12 0415 07/03/12 0810 07/01/12 0842  WBC 6.9 6.1 --  NEUTROABS -- -- 7.6  HGB 11.2* 10.4* --  HCT 33.7* 31.2* --  MCV 80.4 78.8 --  PLT 116* 104* --   CBG:  Lab 07/03/12 1851 07/03/12 1245 07/03/12 0620 07/03/12 0041  GLUCAP 161* 105* 120* 125*    Thyroid Function Tests:  Lab 07/01/12 1630  TSH 0.261*  T4TOTAL --  FREET4 --  T3FREE --  THYROIDAB --    Micro Results: Recent  Results (from the past 240 hour(s))  CULTURE, BLOOD (ROUTINE X 2)     Status: Normal   Collection Time   07/01/12  8:51 AM      Component Value Range Status Comment   Specimen Description BLOOD HAND RIGHT   Final    Special Requests BOTTLES DRAWN AEROBIC AND ANAEROBIC 10CC   Final    Culture  Setup Time 07/01/2012 16:06   Final    Culture     Final    Value: STAPHYLOCOCCUS SPECIES (COAGULASE NEGATIVE)     Note: THE SIGNIFICANCE OF ISOLATING THIS ORGANISM FROM A SINGLE SET OF BLOOD CULTURES WHEN MULTIPLE SETS ARE DRAWN IS UNCERTAIN. PLEASE NOTIFY THE MICROBIOLOGY DEPARTMENT WITHIN ONE WEEK IF SPECIATION AND SENSITIVITIES ARE REQUIRED.     11 Note: Gram Stain Report Called to,Read Back By and Verified With: [facility] COLLINS @ [phone] 07/02/12 BY KRAWS Gram Stain Report Called to,Read Back By and Verified With: KEJI ODUBERE AT 8:00PM 10 13 BY THOMI   Report Status 07/03/2012 FINAL   Final   CULTURE, BLOOD (ROUTINE X 2)     Status: Normal (Preliminary result)  Collection Time   07/01/12  9:00 AM      Component Value Range Status Comment   Specimen Description BLOOD HAND RIGHT   Final    Special Requests BOTTLES DRAWN AEROBIC AND ANAEROBIC 10CC   Final    Culture  Setup Time 07/01/2012 16:06   Final    Culture     Final    Value:        BLOOD CULTURE RECEIVED NO GROWTH TO DATE CULTURE WILL BE HELD FOR 5 DAYS BEFORE ISSUING A FINAL NEGATIVE REPORT   Report Status PENDING   Incomplete   MRSA PCR SCREENING     Status: Normal   Collection Time   07/02/12 11:05 AM      Component Value Range Status Comment   MRSA by PCR NEGATIVE  NEGATIVE Final   CULTURE, BLOOD (ROUTINE X 2)     Status: Normal (Preliminary result)   Collection Time   07/02/12  1:30 PM      Component Value Range Status Comment   Specimen Description BLOOD   Final    Special Requests     Final    Value: BOTTLES DRAWN AEROBIC AND ANAEROBIC 10CC RT FEMORAL HICKMAN   Culture  Setup Time 07/02/2012 22:49   Final    Culture     Final      Value:        BLOOD CULTURE RECEIVED NO GROWTH TO DATE CULTURE WILL BE HELD FOR 5 DAYS BEFORE ISSUING A FINAL NEGATIVE REPORT   Report Status PENDING   Incomplete    Studies/Results: Dg Chest 2 View  07/03/2012  *RADIOLOGY REPORT*  Clinical Data: Fever.  CHEST - 2 VIEW  Comparison: 07/01/2012  Findings: Pulmonary hyperinflation again seen.  Both lungs are clear.  Heart size is at upper limits of normal in stable.  No mass or lymphadenopathy identified.  IMPRESSION: Stable exam.  No active disease.   Original Report Authenticated By: Danae Orleans, M.D.    Ir Removal Tun Cv Cath W/o Fl  07/03/2012  *RADIOLOGY REPORT*  Clinical Data/Indication: Infected right common femoral Hickman catheter.  TUNNEL CATHETER REMOVAL  Procedure: The procedure, risks, benefits, and alternatives were explained to the patient. Questions regarding the procedure were encouraged and answered. The patient understands and consents to the procedure.  The right groin was prepped with betadine in a sterile fashion, and a sterile drape was applied covering the operative field. A sterile gown and sterile gloves were used for the procedure.  1% lidocaine was utilized for local anesthesia. Utilizing blunt dissection, the cuff of the catheter was freed from the underlying subcutaneous tissue. The catheter was removed in its entirety. Hemostasis was achieved with direct pressure.  Complications: None.  IMPRESSION: Successful tunneled right common femoral vein Hickman catheter removal.   Original Report Authenticated By: Donavan Burnet, M.D.    Medications: I have reviewed the patient's current medications. Scheduled Meds:   . calcium acetate  667 mg Oral TID WC  . feeding supplement  1 Container Oral TID BM  . heparin  5,000 Units Subcutaneous Q8H  . multivitamin  1 tablet Oral QHS  . pantoprazole  40 mg Oral Q1200  . predniSONE  5 mg Oral Daily  . sodium chloride  3 mL Intravenous Q12H  . vancomycin  250 mg Intravenous Q  Tue-HD  . vancomycin  500 mg Intravenous Q T,Th,Sa-HD  . DISCONTD: multivitamin  1 tablet Oral Daily  . DISCONTD: vancomycin  125 mg Oral  QID  . DISCONTD: vancomycin  500 mg Intravenous Q T,Th,Sa-HD   Continuous Infusions:   . DISCONTD: fat emulsion    . DISCONTD: TPN (CLINIMIX) +/- additives     PRN Meds:.heparin, oxyCODONE-acetaminophen Assessment/Plan:  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   1) Fever - The patient's fever is most likely 2/2 to bacteremia d/t catheter related infection. Surgery was consulted and they did not think his infection was coming from an abdominal source. CT abdomen on 10/7 showed no residual fluid collection in region of RLQ percutaneous drainage catheter or active infection. Emperic vancomycin, zosyn, and mycofungin were started on 10/10.  Peripheral blood cultures x2 were drawn 10/10 grew out coagulase negative staph. PICC line was removed by IR today, 10/12.  Per ID consult 10/11, zosyn and mycofungin were D/C'd and vancomycin will be continued. Cultures from his PICC line were drawn 10/11 and currently not growing any organisms. Patient had BM x6 on 10/12 but since yesterday no BM --F/u PICC line cultures  - Will continue IV vancomycin  --If temperatures continue to spike, repeat blood cultures x2 and  expand  gram negative coverage.   2) Malnutrition - Currently receiving TNA. Albumin is 2.7 on admission and baseline prealbumin is ~ 25. Of note, < 30 mg/dl indicative of malnutrition while on HD. Surgery was consulted and they are getting a calorie count to decide whether or not to continue TPN. Appreciate their consult. Since PICC line was d/c on 10/12 plan is to take a TPN holiday and then restart TPN once the infection clears using a  PICC line in the same area. Patient has endorsed several times that he does not desire receiving further TPN and primary nephrologist has said that patient told him this multiple times. After PICC line removal, it will be appropriate to speak to the patient, along with surgery and nephrology, to discuss goals of therapy. Patient has very limited sites of access. See discussion below. - Has not a TNA holiday. Surgery would like to restart TNA if possible for better healing - Encouraged po intake and is more then happy to do it . He noted he prefers to have small meal spread ed out instead of large big meals at one time - Patient would like to go home to his sister and he underlined that she takes good air of him.    3) Line management - Patient currently has right peripheral IV, a PICC in his right femoral vein, percutaneous RLQ drain for a small bowel leak, and mature left femoral AV graft for HD. 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 Per surgery, if PICC line is removed but the patient still requires TPN, will start TPN holiday with plan to reinsert PICC line at right femoral due to limited access options following antibiotic treatment. PICC line removed  on 10/12 by IR.   4) Unilateral LE swelling - Patient has had left lower limb edema since d/c from the hospital in August.  Dopplers at the time were negative for DVT. The major risk factor for DVT in this patient is major surgery w/in 12 weeks. However, physical exam reveals a swollen left foot without erythema, pain, warmth, or ulceration. Low clinical suspicion for DVT. Other etiologies for this patient includes chronic  venous insufficiency. LE doppler negative. ABI normal.   --Encourage ambulation, foot elevation and stockings   5) ESRD - HD was interrupted today. Patient received HD 10/12.  - HD per nephrology   6) Thrombocytopenia - The patient presents with a platelet count of 94. Currently 116. Baseline ranges from 120-161. Blood smear w/in normal limits.  --Will continue to monitor CBC   7) Sinus tachycardia - Has been documented in the past, but sporadic. Currently stable. TSH 0.26 - f/u free T4  8) Hepatitis C: Hepatitis panel was reactive for Hep C in 04/2012. Hepatitis B and HIV negative. Abdominal CT with contrast in 06/28/12 was negative for liver lesions suspicious for HCC. AFP tumor Negative   9) Gout - Currently stable, says that it improved after HD was started. Currently managed by 5mg  prednisone qDay. Allergic to allopurinol.  --Continue prednisone per primary nephrologist, Dr. Darrick Penna.   10) Anemia - likely 2/2 anemia of chronic inflammation. Iron panel in July 2013 shows low iron and UIBC and elevated ferritin.  --Continue to monitor H/H.   11) Prophylaxis:  --DVT: subcutaneous Heparin  --PUD: Protonix     LOS: 3 days   Chakara Bognar 07/04/2012, 10:57 AM

## 2012-07-04 NOTE — Progress Notes (Signed)
  Subjective: No real complaints this am, having some loose stools, temp spike again  Objective: Vital signs in last 24 hours: Temp:  [97.5 F (36.4 C)-103.1 F (39.5 C)] 98.6 F (37 C) (10/13 0814) Pulse Rate:  [77-131] 85  (10/13 0814) Resp:  [13-37] 13  (10/13 0814) BP: (96-150)/(52-71) 140/60 mmHg (10/13 0814) SpO2:  [80 %-100 %] 95 % (10/13 0814) Weight:  [100 lb 1.4 oz (45.4 kg)-102 lb 4.7 oz (46.4 kg)] 100 lb 1.4 oz (45.4 kg) (10/13 0500) Last BM Date: 07/01/12  Intake/Output from previous day: 10/12 0701 - 10/13 0700 In: 0  Out: 2874 [Drains:22] Intake/Output this shift:    General appearance: no distress GI: soft, nontender, drain with some bilious output, bs present  Lab Results:   [facility] 07/04/12 0415 07/03/12 0810  WBC 6.9 6.1  HGB 11.2* 10.4*  HCT 33.7* 31.2*  PLT 116* 104*   BMET  Basename 07/04/12 0415 07/03/12 0810  NA 139 138  K 3.9 4.1  CL 98 99  CO2 26 22  GLUCOSE 101* 123*  BUN 34* 69*  CREATININE 6.36* 9.42*  CALCIUM 9.9 9.4   PT/INR No results found for this basename: LABPROT:2,INR:2 in the last 72 hours ABG No results found for this basename: PHART:2,PCO2:2,PO2:2,HCO3:2 in the last 72 hours  Studies/Results: Dg Chest 2 View  07/03/2012  *RADIOLOGY REPORT*  Clinical Data: Fever.  CHEST - 2 VIEW  Comparison: 07/01/2012  Findings: Pulmonary hyperinflation again seen.  Both lungs are clear.  Heart size is at upper limits of normal in stable.  No mass or lymphadenopathy identified.  IMPRESSION: Stable exam.  No active disease.   Original Report Authenticated By: Danae Orleans, M.D.    Ir Removal Tun Cv Cath W/o Fl  07/03/2012  *RADIOLOGY REPORT*  Clinical Data/Indication: Infected right common femoral Hickman catheter.  TUNNEL CATHETER REMOVAL  Procedure: The procedure, risks, benefits, and alternatives were explained to the patient. Questions regarding the procedure were encouraged and answered. The patient understands and consents to  the procedure.  The right groin was prepped with betadine in a sterile fashion, and a sterile drape was applied covering the operative field. A sterile gown and sterile gloves were used for the procedure.  1% lidocaine was utilized for local anesthesia. Utilizing blunt dissection, the cuff of the catheter was freed from the underlying subcutaneous tissue. The catheter was removed in its entirety. Hemostasis was achieved with direct pressure.  Complications: None.  IMPRESSION: Successful tunneled right common femoral vein Hickman catheter removal.   Original Report Authenticated By: Donavan Burnet, M.D.      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  Has c diff pending when gives sample. He will need to be on tna at some point again if this fistula is going to heal conservatively.  Fistula output is small and this has chance of healing.   LOS: 3 days    [facility] 07/04/2012

## 2012-07-04 NOTE — Progress Notes (Signed)
Subjective: PICC out now and temps are down. Inpatient blood cx's + only fo CNSS in one bottle. Outpatient blood cultures from HD on  Objective Vital signs in last 24 hours: Filed Vitals:   07/04/12 0800 07/04/12 0814 07/04/12 0900 07/04/12 1000  BP:  140/60    Pulse: 83 85 96 89  Temp:  98.6 F (37 C)    TempSrc:  Oral    Resp: 16 13 20 18   Height:      Weight:      SpO2: 99% 95% 99% 99%   Weight change: -2.8 kg (-6 lb 2.8 oz)  Intake/Output Summary (Last 24 hours) at 07/04/12 1132 Last data filed at 07/04/12 0600  Gross per 24 hour  Intake      0 ml  Output     22 ml  Net    -22 ml   Labs: Basic Metabolic Panel:  Lab 07/04/12 1610 07/03/12 0810 07/02/12 0610 07/01/12 0842  NA 139 138 137 140  K 3.9 4.1 4.2 3.3*  CL 98 99 97 96  CO2 26 22 24 27   GLUCOSE 101* 123* 79 74  BUN 34* 69* 53* 41*  CREATININE 6.36* 9.42* 7.59* 5.75*  ALB -- -- -- --  CALCIUM 9.9 9.4 9.9 10.1  PHOS -- 4.2 3.8 --   Liver Function Tests:  Lab 07/02/12 0610 07/01/12 0842  AST 47* 43*  ALT 32 35  ALKPHOS 156* 198*  BILITOT 0.7 1.0  PROT 5.9* 6.8  ALBUMIN 2.3* 2.7*   No results found for this basename: LIPASE:3,AMYLASE:3 in the last 168 hours No results found for this basename: AMMONIA:3 in the last 168 hours CBC:  Lab 07/04/12 0415 07/03/12 0810 07/02/12 0610 07/01/12 0842  WBC 6.9 6.1 6.2 8.8  NEUTROABS -- -- -- 7.6  HGB 11.2* 10.4* 10.7* 12.6*  HCT 33.7* 31.2* 32.2* 37.7*  MCV 80.4 78.8 80.3 80.6  PLT 116* 104* 102* 94*    Physical Exam:  Blood pressure 140/60, pulse 89, temperature 98.6 F (37 C), temperature source Oral, resp. rate 18, height 6' (1.829 m), weight 45.4 kg (100 lb 1.4 oz), SpO2 99.00%.  Gen: alert, no distress  Neck: no JVD, no bruits or LAN  Chest: clear bilat, dec'd air movement throughout  Heart: regular, no rub or gallop, soft SEM  Abdomen: soft, midline wound is well healed, scaphoid, nontender. RLQ drain exit site is dry and without drainage; fluid in  leg bag is dark brown, bilious material  Ext: R femoral PICC exit site is minimally erythematous and without drainage. L thigh AVGG without signs of infection.  Neuro: alert, Ox3, no focal deficit, no asterixis   Outpatient HD: [facility] TTS, 3.5 hours. EDW 44kg. L thigh AVGG. No EPO, no Zemplar , no IV iron. (Hb 12). Heparin 1500u. Bath 2K/2.0Ca. Last PTH 238, phos 4.2, Ca 9.9.   Impression/Plan  1. Fevers / gram +bacteremia-  Femoral PICC removed, one+ BCx for CNSS. Will call for results of outpatient cultures. On IV Vanc.  2. CKD, cont hd TTS. Vol excess resolved, 1 kg over EDW today 3. SBO s/p exlap wSB resection (04/14/12) and went back to OR (8/1) for dehisced bowel, did more SB resection and omental patch repair to dehisced area. 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  4. HTPH- cont binders.  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 Had severe BM reaction to allopurinol. Would not try to taper. 6. Hx HTN in  past, not on any BP meds now. 7. Hx prior renal transplant (1987 > 2004) 8. Nutrition- off TNA, see how he does on oral nutrition alone.   Vinson Moselle  MD [facility] [phone] pgr    [phone] cell 07/04/2012, 11:32 AM

## 2012-07-05 LAB — RENAL FUNCTION PANEL
CO2: 25 mEq/L (ref 19–32)
Calcium: 9.6 mg/dL (ref 8.4–10.5)
Chloride: 101 mEq/L (ref 96–112)
GFR calc Af Amer: 8 mL/min — ABNORMAL LOW (ref >90)
GFR calc non Af Amer: 6 mL/min — ABNORMAL LOW (ref >90)
Glucose, Bld: 105 mg/dL — ABNORMAL HIGH (ref 70–99)
Potassium: 4 mEq/L (ref 3.5–5.1)
Sodium: 141 mEq/L (ref 135–145)

## 2012-07-05 LAB — CBC
MCHC: 33.7 g/dL (ref 30.0–36.0)
Platelets: 110 10*3/uL — ABNORMAL LOW (ref 150–400)
RDW: 16.3 % — ABNORMAL HIGH (ref 11.5–15.5)

## 2012-07-05 MED ORDER — OMEPRAZOLE 40 MG PO CPDR: 40.0000 mg | DELAYED_RELEASE_CAPSULE | Freq: Every day | ORAL | Status: DC

## 2012-07-05 MED ORDER — BOOST PLUS PO LIQD
237.0000 mL | ORAL | Status: DC
  Filled 2012-07-05 (×2): qty 237

## 2012-07-05 MED ORDER — BOOST PLUS PO LIQD: 237.0000 mL | ORAL | Status: DC

## 2012-07-05 MED ORDER — CALCIUM ACETATE 667 MG PO CAPS
1334.0000 mg | ORAL_CAPSULE | Freq: Three times a day (TID) | ORAL | Status: DC
  Administered 2012-07-05 (×2): 1334 mg via ORAL
  Filled 2012-07-05 (×3): qty 2

## 2012-07-05 MED ORDER — OXYCODONE-ACETAMINOPHEN 5-325 MG PO TABS: 1.0000 | ORAL_TABLET | Freq: Four times a day (QID) | ORAL | Status: DC | PRN

## 2012-07-05 MED ORDER — CALCIUM ACETATE 667 MG PO CAPS: 667.0000 mg | ORAL_CAPSULE | Freq: Three times a day (TID) | ORAL | Status: DC

## 2012-07-05 MED ORDER — RENA-VITE PO TABS: 1.0000 | ORAL_TABLET | Freq: Every day | ORAL | Status: DC

## 2012-07-05 MED ORDER — VANCOMYCIN HCL 500 MG IV SOLR: 500.0000 mg | INTRAVENOUS | Status: DC | PRN

## 2012-07-05 MED FILL — Chlorhexidine Gluconate Soln 0.12%: OROMUCOSAL | Qty: 15 | Status: AC

## 2012-07-05 NOTE — Progress Notes (Signed)
   CARE MANAGEMENT NOTE 07/05/2012  Patient:  [name], [name]   Account Number:  [id number]  Date Initiated:  07/05/2012  Documentation initiated by:  Darlyne Russian  Subjective/Objective Assessment:   Patient admitted with fever and chills     Action/Plan:   Progression of care and discharge planning   Anticipated DC Date:  07/05/2012   Anticipated DC Plan:  HOME W HOME HEALTH SERVICES      DC Planning Services  CM consult      [facility] Choice  HOME HEALTH   Choice offered to / List presented to:          [facility] arranged  HH-1 RN      [facility] agency  [facility]   Status of service:  In process, will continue to follow Medicare Important Message given?   (If response is "NO", the following Medicare IM given date fields will be blank) Date Medicare IM given:   Date Additional Medicare IM given:    Discharge Disposition:    Per UR Regulation:  Reviewed for med. necessity/level of care/duration of stay  If discussed at Long Length of Stay Meetings, dates discussed:    Comments:  07/05/2012  [address] RN, [place]  [phone] CM referral:  Home Health RN  Patient address upon discharge: [address] [place]  [place] Phone: [phone]  Met with patient regarding discharge planning and selection of home health agency for RN visits.  He selected [facility].  [facility] called with referral for RN visits.

## 2012-07-05 NOTE — Discharge Summary (Signed)
Internal Medicine Teaching [facility] Discharge Note  Name: [name] MRN: [id number] DOB: November 26, 1960 51 y.o.  Date of Admission: 07/01/2012  8:27 AM Date of Discharge: 07/05/2012 Attending Physician: Tacey Heap, MD  Discharge Diagnosis:  Coagulase negative staphylococcous  in one blood culture x1 of 2 blood cultures  from admission on 10/10. Patient had blood cultures drawn in the HD center prior to admission which showed also Coagulase negative staphylococcous  in one blood culture x1 out of 2.  Most likely source was Picc line which was removed on 10/12 . Fever resolved. Patient was discharged on Vancomycin for a total of 14 days which he will receive during HD.  SBO (small bowel obstruction) s/p EL/LOA and SBR x 2  Thrombocytopenia  Gout  HTN (hypertension)  Chronic use of steroids  Malnutrition, calorie  Anemia  ESRD (end stage renal disease) on dialysis  Fever   Discharge Medications:   Medication List     As of 07/05/2012 12:47 PM    STOP taking these medications         cephALEXin 500 MG capsule   Commonly known as: KEFLEX      fat emulsion 20 % infusion      fentaNYL 12 MCG/HR   Commonly known as: DURAGESIC - dosed mcg/hr      pantoprazole 40 MG tablet   Commonly known as: PROTONIX      PRESCRIPTION MEDICATION      TPN ADULT      TAKE these medications         calcium acetate 667 MG capsule   Commonly known as: PHOSLO   Take 1 capsule (667 mg total) by mouth 3 (three) times daily with meals.      lactose free nutrition Liqd   Take 237 mLs by mouth daily.      multivitamin Tabs tablet   Take 1 tablet by mouth daily.      omeprazole 40 MG capsule   Commonly known as: PRILOSEC   Take 1 capsule (40 mg total) by mouth daily.      oxyCODONE-acetaminophen 5-325 MG per tablet   Commonly known as: PERCOCET/ROXICET   Take 1 tablet by mouth every 6 (six) hours as needed. For pain      predniSONE 5 MG tablet   Commonly known as: DELTASONE   Take  5 mg by mouth daily.      sodium chloride 0.9 % SOLN 100 mL with vancomycin 500 MG SOLR 500 mg   Inject 500 mg into the vein every hemodialysis.        Disposition and follow-up:   Mr.Meer Capri was discharged from [facility] in stable condition.  At the hospital follow up visit please address : 1. Blood culture x2 were drawn on day of discharge. Please follow up on blood culture 2. Follow up on patient compliance with diet and monitor weight gain  Follow-up Appointments:     Follow-up Information    Follow up with DETERDING,JAMES L, MD. Schedule an appointment as soon as possible for a visit in 5 days.   Contact information:   [address] [facility] [place] [address] [phone]       Follow up with Harriette Bouillon A., MD. On 07/16/2012. (At 10:35 am )    Contact information:   [address] [place] [address] [phone]         Discharge Orders    Future Appointments: Provider: Department: Dept Phone:  Center:   07/16/2012 10:50 AM Maisie Fus A. Cornett, MD [facility] [phone] None   07/30/2012 11:40 AM Maisie Fus A. Cornett, MD [facility] [phone] None     Future Orders Please Complete By Expires   Diet general      Increase activity slowly      Discharge instructions      Comments:   1. Please follow diet recommendation 2. Please follow up with surgery 3. Please follow with Dr Deterding 4. A home health nurse will come by to help with the drain   Call MD for:  temperature >100.4      Call MD for:  persistant nausea and vomiting      Call MD for:  severe uncontrolled pain      Call MD for:  redness, tenderness, or signs of infection (pain, swelling, redness, odor or green/yellow discharge around incision site)      Call MD for:  difficulty breathing, headache or visual disturbances      Call MD for:  persistant dizziness or light-headedness      Call MD for:  extreme fatigue          Consultations: Treatment Team:  [facility], MD Maree Krabbe, MD  Procedures Performed:  Ct Abdomen Pelvis Wo Contrast  06/28/2012  *RADIOLOGY REPORT*  Clinical Data: Right lower quadrant pain.  Follow-up postop abscess.  Recent small bowel obstruction and postop from small bowel resection.  End-stage renal disease on dialysis.  CT ABDOMEN AND PELVIS WITHOUT CONTRAST  Technique:  Multidetector CT imaging of the abdomen and pelvis was performed following the standard protocol without intravenous contrast.  Comparison: 05/29/2012  Findings: Pleural effusions have resolved since previous study. The right lower quadrant percutaneous drainage catheter remains in place and no residual fluid collection is identified.  Minimal amount of free fluid persists in Morison's pouch in the right upper quadrant.  No new or enlarging fluid collections are seen.  No evidence of acute inflammatory process.  Diffuse atrophy of native kidneys is again seen as well a complex cyst involving the left kidney measuring 2.7 x 3.5 cm.  Diffuse atrophy and cortical calcification again seen involving the renal transplant in the left lower quadrant.  There is no evidence of hydronephrosis.  The other abdominal parenchymal organs are unremarkable appearance on this noncontrast study.  A small left adrenal mass measuring approximately 1.0 x 1.7 cm is stable. No other soft tissue masses or lymphadenopathy identified.  Surgical clips seen from prior prostatectomy.  No suspicious bone lesions identified.  IMPRESSION:  1.  No residual fluid collection in region of right lower quadrant percutaneous drainage catheter.  Small amount of free fluid persists in Morison's pouch. 2.  Resolution of bilateral pleural effusions. 3.  Stable small left adrenal mass and left renal complex cystic lesion.   Original Report Authenticated By: Danae Orleans, M.D.    Dg Chest 2 View  07/03/2012  *RADIOLOGY REPORT*  Clinical Data: Fever.  CHEST - 2 VIEW   Comparison: 07/01/2012  Findings: Pulmonary hyperinflation again seen.  Both lungs are clear.  Heart size is at upper limits of normal in stable.  No mass or lymphadenopathy identified.  IMPRESSION: Stable exam.  No active disease.   Original Report Authenticated By: Danae Orleans, M.D.    Dg Chest 2 View  07/01/2012  *RADIOLOGY REPORT*  Clinical Data: Weakness.  Fever.  Dialysis patient.  CHEST - 2 VIEW  Comparison: 04/28/2012.  Findings: Catheter projects over the cardiopericardial  silhouette inferior approach.  The tip of the catheter is in the right atrium. There is no airspace disease or effusion.  Cardiopericardial silhouette is within normal limits.  Aortic arch atherosclerosis. No pulmonary edema or volume overload. On the lateral view, there appears to be a left anterior descending coronary artery stent.  IMPRESSION: No acute cardiopulmonary disease.  Inferior approach catheter in the right atrium.   Original Report Authenticated By: Andreas Newport, M.D.    Ir Removal Tun Cv Cath W/o Fl  07/03/2012  *RADIOLOGY REPORT*  Clinical Data/Indication: Infected right common femoral Hickman catheter.  TUNNEL CATHETER REMOVAL  Procedure: The procedure, risks, benefits, and alternatives were explained to the patient. Questions regarding the procedure were encouraged and answered. The patient understands and consents to the procedure.  The right groin was prepped with betadine in a sterile fashion, and a sterile drape was applied covering the operative field. A sterile gown and sterile gloves were used for the procedure.  1% lidocaine was utilized for local anesthesia. Utilizing blunt dissection, the cuff of the catheter was freed from the underlying subcutaneous tissue. The catheter was removed in its entirety. Hemostasis was achieved with direct pressure.  Complications: None.  IMPRESSION: Successful tunneled right common femoral vein Hickman catheter removal.   Original Report Authenticated By: Donavan Burnet, M.D.       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 Patient was undergoing HD today when he was noted to have fever and tachycardia . HD was not completed and he was transferred to the ED. He reports that he was fine when he went to the HD center today but within one hour of HD he noticed chills and not feeling well at all. He further reported of a non-productive cough since few weeks but denies any fever or chills at that time. He does not make any urine. He denies any headache, neck stiffness, SOB, abdominal pain, nausea or vomiting, diarrhea or constipation.  He lives in a SNF after he was d/c from the in 04/2012.  Of noted: patient s/p exploratory laparotomy August 2013 with small bowel resection for small bowel obstruction. 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 Is followed up by surgery as an outpatient has seen Dr Dwain Sarna on 06/23/12. fistulogram which showed indication the catheter the small bowel indicated intracutaneous fistula. Patient is moving bowls. Had Ct abdomen on 06/28/12 which showed No residual fluid collection in region of right lower quadrant percutaneous drainage catheter. Small amount of free fluid persists in Morison's pouch. 2. Resolution of bilateral pleural effusions. 3. Stable small left adrenal mass and left renal complex cystic lesion.   Hospital Course by problem list:  # Fever - Likely due to  coagulase negative staphylococcus from an infected PICC line .    On 10/10, the patient had completed a partial course of hemodialysis when he began to develop rigors, tachycardia, and a fever to 102F. In the ED, the patient had a fever of 102.52F  and was admitted to the medical renal unit. We drew two peripheral blood cultures and started him on empiric vancomycin, zosyn, and mycofungin, as TPN administration put him  at an increased his risk for catheter-related candidemia. At this point, surgery was consulted for wounds related to an exploratory laparotomy and small bowel resection in August 2013, and they did not think his infection was coming from an abdominal source. Furthermore, an abdominal CT on 10/7 showed no residual fluid collection in region of RLQ percutaneous drainage catheter or active infection. One peripheral blood culture drawn on 10/10 came back positive for Gram positive cocci in pairs x1 and on 10/12, grew out coagulase negative staphylococci. Cultures from his PICC line were drawn 10/11 and have shown NGTD x 4 days. Per ID, Zosyn and mycofungin were discontinued on 10/11. On 10/12, the patient began to spike fevers to 102.21F before dialysis. The PICC line was removed on 10/12. The patient's clinical picture stabilized and he has remained afebrile since 4:00PM on 10/12.  The patient had also endorsed several bouts of watery stools the night of 10/11 however, PCR for C. difficile was negative and the patient's BM returned to normal. As one blood culture grew out coagulase negative staphylococci, our plan is to continue his IV vancomycin for a total course of 14 days. On discharge, he is on day 4 of 14. He will receive this during his regular dialysis visits.  #Malnutrition - On admission, the patient was receiving TPN with albumin of 2.7 and prealbumin of 20.6. His baseline prealbumin is ~ 25. The patient meets the requirements of malnutrition. Following PICC removal, surgery had initially planned to place another PICC line in the same area and restart TPN. However, the patient has endorsed to our team several times that he does not desire TPN if it requires him to remain at his SNF. His main desire is to return to live with his sister.  His primary nephrologist confirmed these goals. The patient is tolerating oral nutrition and during his hospital course, we have consulted nutrition and surgery about his caloric status. On discharge, we plan to keep him on a TPN holiday with encouragement to eat as much as he can by mouth. Nutrition has made recommendations for specific caloric boosters. Surgery would like to initiated TPN after Holiday to improve healing process   #Unilateral LE swelling and diminished pulse - His swelling is most likely caused by chronic venous insufficiency. The patient has had left lower foot edema since d/c from the hospital in August. Dopplers at the time were negative for DVT. A central venogram on 11/07/11 showed wide patency of his left iliac venous system and IVC. Physical exam revealed a swollen left foot without erythema, pain, warmth, or ulceration. However, since the patient had major surgery w/in 12 weeks and sinus tachycardia > 100 (Wells score of 2), Dopplers were ordered on 10/11 and found to be negative for DVT. Ankle-brachial index was WNL, ruling out arterial etiologies that could cause weakened pulse. During his hospital course, we provided him with compression stockings and encouraged foot elevation.  #ESRD - Nephrology was consulted. The patient received one instance of hemodialysis during his hospital course on 10/12  #Thrombocytopenia - The patient presents with a platelet count of 94. It was 110  on discharge. It steadily rose during his hospital stay. In addition to monitoring his CBC and for any extrahematological symptoms, we also conducted a blood smear which was normal.   #Sinus tachycardia - The patient presented with sinus tachycardia of 125 in the ED. EKG did not show any evidence of arrhythmia. His sinus tachycardia was most likely 2/2 to his bacteremia. He has  had documented, but sporadic tachycardia in the past and endorsed transient palpitations outside of the hospital. May benefit  from Holter monitoring as outpatient.  #Hepatitis C: Hepatitis panel was reactive for Hep C in 04/2012. Hepatitis B and HIV negative. Abdominal CT with contrast in 06/28/12 was negative for liver lesions suspicious for HCC. AFP tumor marker was WNL, although evidence regarding the sensitivity for this test in the screening for [facility] development is mixed in the literature.  #Gout - During his hospital course, he had no gout flare-ups. We continued his gout regimen of 5mg  prednisone qDay, per his nephrologist Dr. Darrick Penna. As the patient cannot tolerate allopurinol (he develops thrombocytopenia), febuxostat may be a viable alternative for gout management.  #Anemia - The patient's anemia likely 2/2 anemia of chronic inflammation, given clinical history. Iron panel in July 2013 shows low iron and UIBC and elevated ferritin. During his hospital course we monitored his hemoglobin and hematocrit, both of which remained at baseline levels.   #Prophylaxis - During the patient's hospital course, he was on subcutaneous heparin prophylaxis for DVT and pantoprazole for PUD prophylaxis.  Discharge Vitals:  BP 167/70  Pulse 82  Temp 97.8 F (36.6 C) (Oral)  Resp 20  Ht 6\' 1"  (1.854 m)  Wt 98 lb 12.3 oz (44.8 kg)  BMI 13.03 kg/m2  SpO2 100%  Discharge Labs:  Results for orders placed during the hospital encounter of 07/01/12 (from the past 24 hour(s))  GLUCOSE, CAPILLARY     Status: Abnormal   Collection Time   07/04/12  2:10 PM      Component Value Range   Glucose-Capillary 111 (*) 70 - 99 mg/dL  RENAL FUNCTION PANEL     Status: Abnormal   Collection Time   07/05/12  4:50 AM      Component Value Range   Sodium 141  135 - 145 mEq/L   Potassium 4.0  3.5 - 5.1 mEq/L   Chloride 101  96 - 112 mEq/L   CO2 25  19 - 32 mEq/L   Glucose, Bld 105 (*) 70 - 99 mg/dL   BUN 48 (*) 6 - 23 mg/dL   Creatinine, Ser 4.09 (*) 0.50 - 1.35 mg/dL   Calcium 9.6  8.4 - 81.1 mg/dL   Phosphorus 5.4 (*) 2.3 - 4.6 mg/dL    Albumin 2.3 (*) 3.5 - 5.2 g/dL   GFR calc non Af Amer 6 (*) >90 mL/min   GFR calc Af Amer 8 (*) >90 mL/min  CBC     Status: Abnormal   Collection Time   07/05/12  4:50 AM      Component Value Range   WBC 5.8  4.0 - 10.5 K/uL   RBC 3.91 (*) 4.22 - 5.81 MIL/uL   Hemoglobin 10.6 (*) 13.0 - 17.0 g/dL   HCT 91.4 (*) 78.2 - 95.6 %   MCV 80.6  78.0 - 100.0 fL   MCH 27.1  26.0 - 34.0 pg   MCHC 33.7  30.0 - 36.0 g/dL   RDW 21.3 (*) 08.6 - 57.8 %   Platelets 110 (*) 150 - 400 K/uL    Signed: Jahbari Repinski 07/05/2012, 12:47 PM   Time Spent on Discharge: 45 min

## 2012-07-05 NOTE — Progress Notes (Signed)
Brief Nutrition Note  RD consulted by MD to educate pt on high calorie, high protein renal diet as pt is not going home on TPN. RD provided handouts with patient and discussed ways to increase calories and protein in each meal. Encouraged small, frequent meals daily.  Expect good compliance. RD to continue to follow nutrition care plan.  Jarold Motto MS, RD, LDN Pager: [phone] After-hours pager: [phone]

## 2012-07-05 NOTE — Progress Notes (Signed)
Medical Student Daily Progress Note   Subjective:    Interval Events:  No acute overnight events. Slept OK. No fever or chills, chest pain or palpitations. Bowel movements are now regular and solid, x3 yesterday. Able to ambulate well. Trying to eat what he can, he would like "high calorie meals in smaller portions" as to not fill him up quickly. Still endorsing he does not want to return to nursing home.     Objective:    Vital Signs:   Temp:  [97.9 F (36.6 C)-98.9 F (37.2 C)] 97.9 F (36.6 C) (10/14 0552) Pulse Rate:  [79-96] 79  (10/14 0552) Resp:  [13-20] 20  (10/14 0552) BP: (125-177)/(60-88) 163/75 mmHg (10/14 0552) SpO2:  [95 %-100 %] 100 % (10/14 0552) Weight:  [44.8 kg (98 lb 12.3 oz)] 44.8 kg (98 lb 12.3 oz) (10/13 2200) Last BM Date: 07/04/12   Weights: 24-hour Weight change: -4.3 kg (-9 lb 7.7 oz)  Filed Weights   07/03/12 2100 07/04/12 0500 07/04/12 2200  Weight: 46.4 kg (102 lb 4.7 oz) 45.4 kg (100 lb 1.4 oz) 44.8 kg (98 lb 12.3 oz)     Intake/Output:   Intake/Output Summary (Last 24 hours) at 07/05/12 0732 Last data filed at 07/05/12 4782  Gross per 24 hour  Intake    120 ml  Output      0 ml  Net    120 ml       Physical Exam: General appearance: alert, cooperative, cachectic and no distress Resp: clear to auscultation bilaterally Cardio: regular rate and rhythm, S1, S2 normal, no murmur, click, rub or gallop GI: soft, non-tender; bowel sounds normal; no masses,  no organomegaly    Labs: Basic Metabolic Panel:  Lab 07/05/12 9562 07/04/12 0415 07/03/12 0810 07/02/12 0610 07/01/12 0842  NA 141 139 138 137 140  K 4.0 3.9 4.1 4.2 3.3*  CL 101 98 99 97 96  CO2 25 26 22 24 27   GLUCOSE 105* 101* 123* 79 74  BUN 48* 34* 69* 53* 41*  CREATININE 8.33* 6.36* 9.42* 7.59* 5.75*  CALCIUM 9.6 9.9 9.4 -- --  MG -- -- 2.0 2.0 --  PHOS 5.4* -- 4.2 3.8 --    Liver Function Tests:  Lab 07/05/12 0450 07/02/12 0610 07/01/12 0842  AST -- 47* 43*  ALT  -- 32 35  ALKPHOS -- 156* 198*  BILITOT -- 0.7 1.0  PROT -- 5.9* 6.8  ALBUMIN 2.3* 2.3* 2.7*   No results found for this basename: LIPASE:5,AMYLASE:5 in the last 168 hours No results found for this basename: AMMONIA:3 in the last 168 hours  CBC:  Lab 07/05/12 0450 07/04/12 0415 07/03/12 0810 07/02/12 0610 07/01/12 0842  WBC 5.8 6.9 6.1 6.2 8.8  NEUTROABS -- -- -- -- 7.6  HGB 10.6* 11.2* 10.4* 10.7* 12.6*  HCT 31.5* 33.7* 31.2* 32.2* 37.7*  MCV 80.6 80.4 78.8 80.3 80.6  PLT 110* 116* 104* 102* 94*    Cardiac Enzymes: No results found for this basename: CKTOTAL:5,CKMB:5,CKMBINDEX:5,TROPONINI:5 in the last 168 hours  BNP (last 3 results): No results found for this basename: PROBNP:3 in the last 8760 hours  CBG:  Lab 07/04/12 1410 07/03/12 1851 07/03/12 1245 07/03/12 0620 07/03/12 0041  GLUCAP 111* 161* 105* 120* 125*    Coagulation Studies: No results found for this basename: LABPROT:5,INR:5 in the last 72 hours  Microbiology: Results for orders placed during the hospital encounter of 07/01/12  CULTURE, BLOOD (ROUTINE X 2)     Status:  Normal   Collection Time   07/01/12  8:51 AM      Component Value Range Status Comment   Specimen Description BLOOD HAND RIGHT   Final    Special Requests BOTTLES DRAWN AEROBIC AND ANAEROBIC 10CC   Final    Culture  Setup Time 07/01/2012 16:06   Final    Culture     Final    Value: STAPHYLOCOCCUS SPECIES (COAGULASE NEGATIVE)     Note: THE SIGNIFICANCE OF ISOLATING THIS ORGANISM FROM A SINGLE SET OF BLOOD CULTURES WHEN MULTIPLE SETS ARE DRAWN IS UNCERTAIN. PLEASE NOTIFY THE MICROBIOLOGY DEPARTMENT WITHIN ONE WEEK IF SPECIATION AND SENSITIVITIES ARE REQUIRED.     11 Note: Gram Stain Report Called to,Read Back By and Verified With: [facility] COLLINS @ [phone] 07/02/12 BY KRAWS Gram Stain Report Called to,Read Back By and Verified With: KEJI ODUBERE AT 8:00PM 10 13 BY THOMI   Report Status 07/03/2012 FINAL   Final   CULTURE, BLOOD (ROUTINE X 2)      Status: Normal (Preliminary result)   Collection Time   07/01/12  9:00 AM      Component Value Range Status Comment   Specimen Description BLOOD HAND RIGHT   Final    Special Requests BOTTLES DRAWN AEROBIC AND ANAEROBIC 10CC   Final    Culture  Setup Time 07/01/2012 16:06   Final    Culture     Final    Value:        BLOOD CULTURE RECEIVED NO GROWTH TO DATE CULTURE WILL BE HELD FOR 5 DAYS BEFORE ISSUING A FINAL NEGATIVE REPORT   Report Status PENDING   Incomplete   MRSA PCR SCREENING     Status: Normal   Collection Time   07/02/12 11:05 AM      Component Value Range Status Comment   MRSA by PCR NEGATIVE  NEGATIVE Final   CULTURE, BLOOD (ROUTINE X 2)     Status: Normal (Preliminary result)   Collection Time   07/02/12  1:30 PM      Component Value Range Status Comment   Specimen Description BLOOD   Final    Special Requests     Final    Value: BOTTLES DRAWN AEROBIC AND ANAEROBIC 10CC RT FEMORAL HICKMAN   Culture  Setup Time 07/02/2012 22:49   Final    Culture     Final    Value:        BLOOD CULTURE RECEIVED NO GROWTH TO DATE CULTURE WILL BE HELD FOR 5 DAYS BEFORE ISSUING A FINAL NEGATIVE REPORT   Report Status PENDING   Incomplete   CLOSTRIDIUM DIFFICILE BY PCR     Status: Normal   Collection Time   07/04/12 11:04 AM      Component Value Range Status Comment   C difficile by pcr NEGATIVE  NEGATIVE Final     Other results:   Imaging: Dg Chest 2 View  07/03/2012  *RADIOLOGY REPORT*  Clinical Data: Fever.  CHEST - 2 VIEW  Comparison: 07/01/2012  Findings: Pulmonary hyperinflation again seen.  Both lungs are clear.  Heart size is at upper limits of normal in stable.  No mass or lymphadenopathy identified.  IMPRESSION: Stable exam.  No active disease.   Original Report Authenticated By: Danae Orleans, M.D.    Ir Removal Tun Cv Cath W/o Fl  07/03/2012  *RADIOLOGY REPORT*  Clinical Data/Indication: Infected right common femoral Hickman catheter.  TUNNEL CATHETER REMOVAL   Procedure: The procedure, risks, benefits, and  alternatives were explained to the patient. Questions regarding the procedure were encouraged and answered. The patient understands and consents to the procedure.  The right groin was prepped with betadine in a sterile fashion, and a sterile drape was applied covering the operative field. A sterile gown and sterile gloves were used for the procedure.  1% lidocaine was utilized for local anesthesia. Utilizing blunt dissection, the cuff of the catheter was freed from the underlying subcutaneous tissue. The catheter was removed in its entirety. Hemostasis was achieved with direct pressure.  Complications: None.  IMPRESSION: Successful tunneled right common femoral vein Hickman catheter removal.   Original Report Authenticated By: Donavan Burnet, M.D.       Medications:    Infusions:     Scheduled Medications:    . calcium acetate  667 mg Oral TID WC  . feeding supplement  1 Container Oral TID BM  . heparin  5,000 Units Subcutaneous Q8H  . multivitamin  1 tablet Oral QHS  . pantoprazole  40 mg Oral Q1200  . predniSONE  5 mg Oral Daily  . sodium chloride  3 mL Intravenous Q12H  . vancomycin  250 mg Intravenous Q Tue-HD  . vancomycin  500 mg Intravenous Q T,Th,Sa-HD     PRN Medications: heparin, oxyCODONE-acetaminophen   Assessment/ Plan:    Mr. 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   1) Fever - The patient's fever is most likely 2/2 to bacteremia d/t catheter related infection. Surgery was consulted and they did not think his infection was coming from an abdominal source. CT abdomen on 10/7 showed no residual fluid collection in region of RLQ percutaneous drainage catheter or active infection. Emperic vancomycin, zosyn, and  mycofungin were started on 10/10. Peripheral blood cultures x2 were drawn 10/10 and one came back positive for Gram positive cocci in pairs and on 10/12, grew out coagulase negative staph. Cultures from his PICC line were drawn 10/11 and have NGTD x1d. Per ID, Zosyn and mycofungin were d/c'd 10/11 and IV vancomycin was continued. He is on day 4 of total vancomycin coverage and day 2 s/p PICC line removal on 10/12. The literature supports a 5-7d course of vancomycin treatment for CoN staph after catheter removal. Given his ESRD, recent bowel surgery, and immunosuppressed status from chronic prednisone, we will plan to treat for a total course of 10 days. He is currently on day 4/10. Although patient had symptoms of loose watery stools x6 on 10/11, bowel symptoms have returned to baseline and C diff PCR was negative. --F/u PICC and peripheral BCx (results to date as above). --If temperatures continue to spike, repeat blood cultures x2  --Initated Vanc+Zosyn+Mycofungin 10/10, d/c zosyn, mycofungin 10/11. Continue IV vanc, day 4/10.  --Surgery was consulted as to whether TPN remains a requirement, please see point #2 for further discussion.   2) Malnutrition - Currently receiving TNA. Albumin is 2.7 on admission and baseline prealbumin is ~ 25. Of note, < 30 mg/dl indicative of malnutrition while on HD. Surgery was consulted about TPN cessation. Following PICC removal, surgery had initially planned to place another PICC line in the same area and restart TPN. However, the patient has endorsed to me several times that he does not desire receiving further TPN as this does  not permit him to return to living with his sister. His primary nephrologist has confirmed these sentiments, saying that the patient told him this multiple times. After PICC line removal, it will be appropriate to speak to the patient, along with surgery and nephrology, to discuss goals of therapy. Patient has very limited sites of access. See  discussion below.  --Prealbumin at 20.6.  --d/cTPN 10/12.  --Encouraged patient to eat as much as he could of whatever he found appetizing. He prefers to eat his meals spread out over a long period of time. On a full diet. Calorie counting would be beneficial.  3) Line management - Patient currently has right peripheral IV, a PICC in his right femoral vein, percutaneous RLQ drain for a small bowel leak, and mature left femoral AV graft for HD. 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 Per surgery, if PICC line is removed but the patient still requires TPN, will start TPN holiday with plan to reinsert PICC line at right femoral due to limited access options following antibiotic treatment.  --PICC line removed on 10/12 by IR. No complications.   4) Unilateral LE swelling and diminished pulse - Patient has had left lower foot edema since d/c from the hospital in August. Dopplers at the time were negative for DVT. The major risk factor for DVT in this patient is major surgery w/in 12 weeks. However, physical exam reveals a swollen left foot without erythema, pain, warmth, or ulceration. Low clinical suspicion for DVT. Dopplers on 10/11 were negative for DVT. Ankle-brachial index was WNL, ruling out arterial etiologies that could cause weakened pulse. A central venogram on 11/07/11 showed wide patency of his left iliac venous system and IVC. Etiologies for this patient includes chronic venous insufficiency of his deep venous system.  --DVT prophylaxis, below  --Compression stockings, encourage ambulation, foot elevation.   5) ESRD - HD was interrupted today. Nephrology was  consulted. Will help manage, appreciate their consult. Patient is currently on 5mg  prednisone daily, which he began after his transplant and believes he's continuing it for this reason. Dr. Arlean Hopping says that the patient's primary nephrologist is maintaining his predisone dose for treatment of gout. We will defer to his management. We appreciate their consult.  --Patient received HD 10/12.   6) Thrombocytopenia - The patient presents with a platelet count of 94. Currently 102. Baseline ranges from 120-161. Possible etiologies for thrombocytopenia in this patient include pseudothrombocytopenia vs. Infection vs. TTP vs ITP.  --Will continue to monitor CBC and for extrahematological symptoms.  --Blood smear w/in normal limits.   7) Sinus tachycardia - Has been documented in the past, but sporadic. Most likely 2/2 to fever / underlying infection. Currently stable. Patient endorsed having transient palpitations in the outpatient setting. May benefit from Holter monitoring as outpatient.  8) Hepatitis C: Hepatitis panel was reactive for Hep C in 04/2012. Hepatitis B and HIV negative. Abdominal CT with contrast in 06/28/12 was negative for liver lesions suspicious for HCC. AFP tumor marker was WNL, although evidence regarding the sensitivity for this test in the screening for [facility] development is mixed in the literature.   9) Gout - Currently stable, says that it improved after HD was started. Currently  managed by 5mg  prednisone qDay. Allergic to allopurinol.  --Continue prednisone per primary nephrologist, Dr. Darrick Penna.   10) Anemia - likely 2/2 anemia of chronic inflammation. Iron panel in July 2013 shows low iron and UIBC and elevated ferritin.  --Continue to monitor H/H.   11) Prophylaxis:  --DVT: subcutaneous Heparin  --PUD: Protonix   LOS: 3 days   Wynelle Link, MS4  July 04, 2012    This is a Psychologist, occupational Note.  The care of the patient was discussed with Dr. Loistine Chance and the assessment and  plan formulated with their assistance.  Please see their attached note or addendum for official documentation of the daily encounter.  I have reviewed and agree with Medical student Wynelle Link. Please see my separate note .  Almyra Deforest, MD

## 2012-07-05 NOTE — Progress Notes (Signed)
I have seen and examined this patient and agree with the plan of care  TTS dialysis appears improved and starting to eat.  Stuti Sandin W 07/05/2012, 1:09 PM

## 2012-07-05 NOTE — Progress Notes (Signed)
Subjective: Patient does not want to go back on TNA as that will require SNF placement again  Objective: Vital signs in last 24 hours: Temp:  [97.9 F (36.6 C)-98.9 F (37.2 C)] 97.9 F (36.6 C) (10/14 0552) Pulse Rate:  [79-96] 79  (10/14 0552) Resp:  [16-20] 20  (10/14 0552) BP: (125-177)/(64-88) 163/75 mmHg (10/14 0552) SpO2:  [97 %-100 %] 100 % (10/14 0552) Weight:  [44.8 kg (98 lb 12.3 oz)] 44.8 kg (98 lb 12.3 oz) (10/13 2200) Last BM Date: 07/04/12  Intake/Output from previous day: 10/13 0701 - 10/14 0700 In: 120 [P.O.:120] Out: 0  Intake/Output this shift:    General appearance: alert and cooperative GI: soft, NT, drain output character the same R groin dressing  Lab Results:   [facility] 07/05/12 0450 07/04/12 0415  WBC 5.8 6.9  HGB 10.6* 11.2*  HCT 31.5* 33.7*  PLT 110* 116*   BMET  Basename 07/05/12 0450 07/04/12 0415  NA 141 139  K 4.0 3.9  CL 101 98  CO2 25 26  GLUCOSE 105* 101*  BUN 48* 34*  CREATININE 8.33* 6.36*  CALCIUM 9.6 9.9   PT/INR No results found for this basename: LABPROT:2,INR:2 in the last 72 hours ABG No results found for this basename: PHART:2,PCO2:2,PO2:2,HCO3:2 in the last 72 hours  Studies/Results: Dg Chest 2 View  07/03/2012  *RADIOLOGY REPORT*  Clinical Data: Fever.  CHEST - 2 VIEW  Comparison: 07/01/2012  Findings: Pulmonary hyperinflation again seen.  Both lungs are clear.  Heart size is at upper limits of normal in stable.  No mass or lymphadenopathy identified.  IMPRESSION: Stable exam.  No active disease.   Original Report Authenticated By: Danae Orleans, M.D.    Ir Removal Tun Cv Cath W/o Fl  07/03/2012  *RADIOLOGY REPORT*  Clinical Data/Indication: Infected right common femoral Hickman catheter.  TUNNEL CATHETER REMOVAL  Procedure: The procedure, risks, benefits, and alternatives were explained to the patient. Questions regarding the procedure were encouraged and answered. The patient understands and consents to the  procedure.  The right groin was prepped with betadine in a sterile fashion, and a sterile drape was applied covering the operative field. A sterile gown and sterile gloves were used for the procedure.  1% lidocaine was utilized for local anesthesia. Utilizing blunt dissection, the cuff of the catheter was freed from the underlying subcutaneous tissue. The catheter was removed in its entirety. Hemostasis was achieved with direct pressure.  Complications: None.  IMPRESSION: Successful tunneled right common femoral vein Hickman catheter removal.   Original Report Authenticated By: Donavan Burnet, M.D.     Anti-infectives: Anti-infectives     Start     Dose/Rate Route Frequency Ordered Stop   07/08/12 1200   vancomycin (VANCOCIN) 500 mg in sodium chloride 0.9 % 100 mL IVPB        500 mg 100 mL/hr over 60 Minutes Intravenous Every T-Th-Sa (Hemodialysis) 07/03/12 1520     07/06/12 1200   vancomycin (VANCOCIN) 250 mg in sodium chloride 0.9 % 100 mL IVPB        250 mg 100 mL/hr over 60 Minutes Intravenous Every Tue (Hemodialysis) 07/03/12 1520 07/13/12 1159   07/03/12 1400   vancomycin (VANCOCIN) 50 mg/mL oral solution 125 mg  Status:  Discontinued        125 mg Oral 4 times daily 07/03/12 1332 07/03/12 1353   07/03/12 1200   vancomycin (VANCOCIN) 500 mg in sodium chloride 0.9 % 100 mL IVPB  Status:  Discontinued  500 mg 100 mL/hr over 60 Minutes Intravenous Every T-Th-Sa (Hemodialysis) 07/01/12 1705 07/03/12 1520   07/01/12 1900   micafungin (MYCAMINE) 100 mg in sodium chloride 0.9 % 100 mL IVPB  Status:  Discontinued        100 mg 100 mL/hr over 1 Hours Intravenous Daily 07/01/12 1551 07/02/12 1143   07/01/12 1900   piperacillin-tazobactam (ZOSYN) IVPB 2.25 g  Status:  Discontinued        2.25 g 100 mL/hr over 30 Minutes Intravenous 3 times per day 07/01/12 1705 07/02/12 1143   07/01/12 1400   piperacillin-tazobactam (ZOSYN) IVPB 3.375 g  Status:  Discontinued        3.375 g 100 mL/hr  over 30 Minutes Intravenous 3 times per day 07/01/12 1023 07/01/12 1113   07/01/12 1115   piperacillin-tazobactam (ZOSYN) IVPB 3.375 g  Status:  Discontinued        3.375 g 100 mL/hr over 30 Minutes Intravenous  Once 07/01/12 1113 07/02/12 1143   07/01/12 1030   vancomycin (VANCOCIN) IVPB 1000 mg/200 mL premix        1,000 mg 200 mL/hr over 60 Minutes Intravenous  Once 07/01/12 1023 07/01/12 1318          Assessment/Plan: EC fistula - drain in place and this is improving - CT 10/2 showed no residual collection and drain is controlling output, tolerating diet Suspected line sepsis - line out and Tx ongoing, patient at this point is reluctant to have a new line placed.  I spoke to him at length regarding this again. Diarrhea - c diff neg   LOS: 4 days    Prentiss Hammett E 07/05/2012

## 2012-07-05 NOTE — Progress Notes (Signed)
Subjective:  Eating breakfast , tolerating majority of regular Diet this am,He is requesting more Protein in his Diet,"I can not eat large meals since  My Surgery  But eating small frequent"  Objective Vital signs in last 24 hours: Filed Vitals:   07/04/12 1800 07/04/12 2200 07/05/12 0144 07/05/12 0552  BP: 139/88 155/81 145/68 163/75  Pulse: 95 84 82 79  Temp: 98.7 F (37.1 C) 98.3 F (36.8 C) 98.2 F (36.8 C) 97.9 F (36.6 C)  TempSrc: Oral Oral Oral Oral  Resp: 18 18 20 20   Height:  6\' 1"  (1.854 m)    Weight:  44.8 kg (98 lb 12.3 oz)    SpO2: 100% 97% 97% 100%   Weight change: -4.3 kg (-9 lb 7.7 oz)  Intake/Output Summary (Last 24 hours) at 07/05/12 0831 Last data filed at 07/05/12 1191  Gross per 24 hour  Intake    120 ml  Output      0 ml  Net    120 ml   Labs: Basic Metabolic Panel:  Lab 07/05/12 4782 07/04/12 0415 07/03/12 0810 07/02/12 0610  NA 141 139 138 --  K 4.0 3.9 4.1 --  CL 101 98 99 --  CO2 25 26 22  --  GLUCOSE 105* 101* 123* --  BUN 48* 34* 69* --  CREATININE 8.33* 6.36* 9.42* --  CALCIUM 9.6 9.9 9.4 --  ALB -- -- -- --  PHOS 5.4* -- 4.2 3.8   Liver Function Tests:  Lab 07/05/12 0450 07/02/12 0610 07/01/12 0842  AST -- 47* 43*  ALT -- 32 35  ALKPHOS -- 156* 198*  BILITOT -- 0.7 1.0  PROT -- 5.9* 6.8  ALBUMIN 2.3* 2.3* 2.7*   No results found for this basename: LIPASE:3,AMYLASE:3 in the last 168 hours No results found for this basename: AMMONIA:3 in the last 168 hours CBC:  Lab 07/05/12 0450 07/04/12 0415 07/03/12 0810 07/02/12 0610 07/01/12 0842  WBC 5.8 6.9 6.1 -- --  NEUTROABS -- -- -- -- 7.6  HGB 10.6* 11.2* 10.4* -- --  HCT 31.5* 33.7* 31.2* -- --  MCV 80.6 80.4 78.8 80.3 80.6  PLT 110* 116* 104* -- --   Cardiac Enzymes: No results found for this basename: CKTOTAL:5,CKMB:5,CKMBINDEX:5,TROPONINI:5 in the last 168 hours CBG:  Lab 07/04/12 1410 07/03/12 1851 07/03/12 1245 07/03/12 0620 07/03/12 0041  GLUCAP 111* 161* 105* 120*  125*    Iron Studies: No results found for this basename: IRON,TIBC,TRANSFERRIN,FERRITIN in the last 72 hours Studies/Results: Dg Chest 2 View  07/03/2012  *RADIOLOGY REPORT*  Clinical Data: Fever.  CHEST - 2 VIEW  Comparison: 07/01/2012  Findings: Pulmonary hyperinflation again seen.  Both lungs are clear.  Heart size is at upper limits of normal in stable.  No mass or lymphadenopathy identified.  IMPRESSION: Stable exam.  No active disease.   Original Report Authenticated By: Danae Orleans, M.D.    Ir Removal Tun Cv Cath W/o Fl  07/03/2012  *RADIOLOGY REPORT*  Clinical Data/Indication: Infected right common femoral Hickman catheter.  TUNNEL CATHETER REMOVAL  Procedure: The procedure, risks, benefits, and alternatives were explained to the patient. Questions regarding the procedure were encouraged and answered. The patient understands and consents to the procedure.  The right groin was prepped with betadine in a sterile fashion, and a sterile drape was applied covering the operative field. A sterile gown and sterile gloves were used for the procedure.  1% lidocaine was utilized for local anesthesia. Utilizing blunt dissection, the cuff of  the catheter was freed from the underlying subcutaneous tissue. The catheter was removed in its entirety. Hemostasis was achieved with direct pressure.  Complications: None.  IMPRESSION: Successful tunneled right common femoral vein Hickman catheter removal.   Original Report Authenticated By: Donavan Burnet, M.D.    Medications:      . calcium acetate  667 mg Oral TID WC  . feeding supplement  1 Container Oral TID BM  . heparin  5,000 Units Subcutaneous Q8H  . multivitamin  1 tablet Oral QHS  . pantoprazole  40 mg Oral Q1200  . predniSONE  5 mg Oral Daily  . sodium chloride  3 mL Intravenous Q12H  . vancomycin  250 mg Intravenous Q Tue-HD  . vancomycin  500 mg Intravenous Q T,Th,Sa-HD   I  have reviewed scheduled and prn medications.  Physical  Exam: General: Alert, thin, NAD,  BM, appropriate Heart: RRR, No rub or murmur Lungs: bs decreased at  Bases, otherwise, CTA Abdomen: bs +=, soft, non tender, Midline  Surgical site healed,RLQ drain exit with ; fluid in leg bag is dark brown, bilious material  Extremities: Dialysis Access: left leg swelling trace chronic with femoral avgg present, pos. Bruit  Fem avgg    Outpatient HD: [facility] TTS, 3.5 hours. EDW 44kg. L thigh AVGG. No EPO, no Zemplar , no IV iron.. Heparin 1500u. Bath 2K/2.0Ca. Last PTH 238.  Impression/Plan  1. Fevers / gram +bacteremia- Femoral PICC removed, one+ BCx for CNSS.Final  results of outpatient cultures.at [facility] pending this am,on IV Vanc.  2. CKD, cont hd TTS. Vol excess resolved, .8 kg over EDW yesterday wt. 3. SBO s/p exlap wSB resection (04/14/12) and went back to OR (8/1) for dehisced bowel, did more SB resection and omental patch repair to dehisced area. 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  4. HTPH- cont binders. 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 Had severe BM reaction to allopurinol. Would not try to taper. 6. Hx HTN in past, not on any BP meds now.attempt 2 liters UF with am hd hd 7. Hx prior renal transplant (1987 > 2004) 8. Nutrition- off TNA, see how he does on oral nutrition alone,Eating well this am, As above "can not eat large amounts  Since abdominal surgery."     [name], PA-C [facility] Beeper [phone] 07/05/2012,8:31 AM  LOS: 4 days

## 2012-07-05 NOTE — Progress Notes (Signed)
Internal Medicine Teaching Service Attending Dr.Tc Kapusta I have examined the patient at bedside today and discussed the management of the Patient with the resident team.Patient medically stable for DC today. Plan to continue vancomycin for 14 days. He could get this st his Dialysis session. Repeat cultures today prior to D/c. Follow up with his nephrologist for same ( he acts as his PCP too).

## 2012-07-05 NOTE — Progress Notes (Signed)
Calorie Count Note  Intervention:  1. Discontinue Resource Breeze 2. Discontinue calorie count 3. Add Chocolate Boost daily - this is the only supplement pt is agreeable to trying 4. Discussed enteral nutrition with surgery, resident, and renal PA. Please consult RD if enteral nutrition warranted. 5. RD to continue to follow nutrition care plan  Pt meets criteria for severe malnutrition in the context of chronic illness as evidenced by 11% wt loss x 3 months and severe muscle mass and fat mass loss.  Pt continues on his "TPN holiday" at this time. Noted that pt does not want to resume TPN, however surgery would like to restart TPN if possible for better healing. Discussed possibility of initiating enteral nutrition instead of TPN with surgery given pt is able to tolerate a PO diet; question if patient would need long term vs short term nutrition support.  No calorie count records have been recorded. Pt states that he has consumed at least 50% of all meals. Unable to verify this.  Diet: Regular Supplements: Resource Breeze TID  Medications:    . calcium acetate  667 mg Oral TID WC  . feeding supplement  1 Container Oral TID BM  . heparin  5,000 Units Subcutaneous Q8H  . multivitamin  1 tablet Oral QHS  . pantoprazole  40 mg Oral Q1200  . predniSONE  5 mg Oral Daily  . sodium chloride  3 mL Intravenous Q12H  . vancomycin  250 mg Intravenous Q Tue-HD  . vancomycin  500 mg Intravenous Q T,Th,Sa-HD   CMP     Component Value Date/Time   NA 141 07/05/2012 0450   K 4.0 07/05/2012 0450   CL 101 07/05/2012 0450   CO2 25 07/05/2012 0450   GLUCOSE 105* 07/05/2012 0450   BUN 48* 07/05/2012 0450   CREATININE 8.33* 07/05/2012 0450   CALCIUM 9.6 07/05/2012 0450   PROT 5.9* 07/02/2012 0610   ALBUMIN 2.3* 07/05/2012 0450   AST 47* 07/02/2012 0610   ALT 32 07/02/2012 0610   ALKPHOS 156* 07/02/2012 0610   BILITOT 0.7 07/02/2012 0610   GFRNONAA 6* 07/05/2012 0450   GFRAA 8* 07/05/2012  0450   Sodium  Date/Time Value Range Status  07/05/2012  4:50 AM 141  135 - 145 mEq/L Final  07/04/2012  4:15 AM 139  135 - 145 mEq/L Final  07/03/2012  8:10 AM 138  135 - 145 mEq/L Final    Potassium  Date/Time Value Range Status  07/05/2012  4:50 AM 4.0  3.5 - 5.1 mEq/L Final  07/04/2012  4:15 AM 3.9  3.5 - 5.1 mEq/L Final  07/03/2012  8:10 AM 4.1  3.5 - 5.1 mEq/L Final    Phosphorus  Date/Time Value Range Status  07/05/2012  4:50 AM 5.4* 2.3 - 4.6 mg/dL Final  30/86/5784  6:96 AM 4.2  2.3 - 4.6 mg/dL Final  29/52/8413  2:44 AM 3.8  2.3 - 4.6 mg/dL Final    Magnesium  Date/Time Value Range Status  07/03/2012  8:10 AM 2.0  1.5 - 2.5 mg/dL Final  09/24/7251  6:64 AM 2.0  1.5 - 2.5 mg/dL Final  12/23/4740  5:95 AM 1.7  1.5 - 2.5 mg/dL Final    Intake/Output Summary (Last 24 hours) at 07/05/12 0837 Last data filed at 07/05/12 0625  Gross per 24 hour  Intake    120 ml  Output      0 ml  Net    120 ml  BM: 10/13  Nutrition Dx: Increased  nutrient needs r/t recent surgery and HD AEB estimated needs; ongoing.   Goal: Pt to meet >/= 90% of their estimated nutrition needs - unmet  Monitor: weights, labs, PO intake, I/O's, supplement tolerance  Jarold Motto MS, RD, LDN Pager: [phone] After-hours pager: [phone]

## 2012-07-05 NOTE — Progress Notes (Signed)
Subjective: Patient is feeling great. No fevers in the last 36 hours. PT does not recommend any other further evalution.  Objective: Vital signs in last 24 hours: Filed Vitals:   07/04/12 1800 07/04/12 2200 07/05/12 0144 07/05/12 0552  BP: 139/88 155/81 145/68 163/75  Pulse: 95 84 82 79  Temp: 98.7 F (37.1 C) 98.3 F (36.8 C) 98.2 F (36.8 C) 97.9 F (36.6 C)  TempSrc: Oral Oral Oral Oral  Resp: 18 18 20 20   Height:  6\' 1"  (1.854 m)    Weight:  98 lb 12.3 oz (44.8 kg)    SpO2: 100% 97% 97% 100%   Weight change: -9 lb 7.7 oz (-4.3 kg)  Intake/Output Summary (Last 24 hours) at 07/05/12 0759 Last data filed at 07/05/12 4540  Gross per 24 hour  Intake    120 ml  Output      0 ml  Net    120 ml   Physical Exam: Constitutional: Vital signs reviewed.  Patient is a well-developed and well-nourished  in no acute distress and cooperative with exam. Alert and oriented x3.  Neck: Supple, Cardiovascular: RRR, S1 normal, S2 normal, no MRG, pulses symmetric and intact bilaterally Pulmonary/Chest: CTAB, no wheezes, rales, or rhonchi Abdominal: Soft. Non-tender, non-distended, bowel sounds are normal,  Neurological: A&O x3  Lab Results: Basic Metabolic Panel:  Lab 07/05/12 9811 07/04/12 0415 07/03/12 0810 07/02/12 0610  NA 141 139 -- --  K 4.0 3.9 -- --  CL 101 98 -- --  CO2 25 26 -- --  GLUCOSE 105* 101* -- --  BUN 48* 34* -- --  CREATININE 8.33* 6.36* -- --  CALCIUM 9.6 9.9 -- --  MG -- -- 2.0 2.0  PHOS 5.4* -- 4.2 --   Liver Function Tests:  Lab 07/05/12 0450 07/02/12 0610 07/01/12 0842  AST -- 47* 43*  ALT -- 32 35  ALKPHOS -- 156* 198*  BILITOT -- 0.7 1.0  PROT -- 5.9* 6.8  ALBUMIN 2.3* 2.3* --    CBC:  Lab 07/05/12 0450 07/04/12 0415 07/01/12 0842  WBC 5.8 6.9 --  NEUTROABS -- -- 7.6  HGB 10.6* 11.2* --  HCT 31.5* 33.7* --  MCV 80.6 80.4 --  PLT 110* 116* --    CBG:  Lab 07/04/12 1410 07/03/12 1851 07/03/12 1245 07/03/12 0620 07/03/12 0041  GLUCAP  111* 161* 105* 120* 125*    Thyroid Function Tests:  Lab 07/04/12 1201 07/01/12 1630  TSH -- 0.261*  T4TOTAL -- --  FREET4 0.92 --  T3FREE -- --  THYROIDAB -- --     Micro Results: Recent Results (from the past 240 hour(s))  CULTURE, BLOOD (ROUTINE X 2)     Status: Normal   Collection Time   07/01/12  8:51 AM      Component Value Range Status Comment   Specimen Description BLOOD HAND RIGHT   Final    Special Requests BOTTLES DRAWN AEROBIC AND ANAEROBIC 10CC   Final    Culture  Setup Time 07/01/2012 16:06   Final    Culture     Final    Value: STAPHYLOCOCCUS SPECIES (COAGULASE NEGATIVE)     Note: THE SIGNIFICANCE OF ISOLATING THIS ORGANISM FROM A SINGLE SET OF BLOOD CULTURES WHEN MULTIPLE SETS ARE DRAWN IS UNCERTAIN. PLEASE NOTIFY THE MICROBIOLOGY DEPARTMENT WITHIN ONE WEEK IF SPECIATION AND SENSITIVITIES ARE REQUIRED.     11 Note: Gram Stain Report Called to,Read Back By and Verified With: [facility] COLLINS @ [phone] 07/02/12 BY  KRAWS Gram Stain Report Called to,Read Back By and Verified With: KEJI ODUBERE AT 8:00PM 10 13 BY THOMI   Report Status 07/03/2012 FINAL   Final   CULTURE, BLOOD (ROUTINE X 2)     Status: Normal (Preliminary result)   Collection Time   07/01/12  9:00 AM      Component Value Range Status Comment   Specimen Description BLOOD HAND RIGHT   Final    Special Requests BOTTLES DRAWN AEROBIC AND ANAEROBIC 10CC   Final    Culture  Setup Time 07/01/2012 16:06   Final    Culture     Final    Value:        BLOOD CULTURE RECEIVED NO GROWTH TO DATE CULTURE WILL BE HELD FOR 5 DAYS BEFORE ISSUING A FINAL NEGATIVE REPORT   Report Status PENDING   Incomplete   MRSA PCR SCREENING     Status: Normal   Collection Time   07/02/12 11:05 AM      Component Value Range Status Comment   MRSA by PCR NEGATIVE  NEGATIVE Final   CULTURE, BLOOD (ROUTINE X 2)     Status: Normal (Preliminary result)   Collection Time   07/02/12  1:30 PM      Component Value Range Status Comment    Specimen Description BLOOD   Final    Special Requests     Final    Value: BOTTLES DRAWN AEROBIC AND ANAEROBIC 10CC RT FEMORAL HICKMAN   Culture  Setup Time 07/02/2012 22:49   Final    Culture     Final    Value:        BLOOD CULTURE RECEIVED NO GROWTH TO DATE CULTURE WILL BE HELD FOR 5 DAYS BEFORE ISSUING A FINAL NEGATIVE REPORT   Report Status PENDING   Incomplete   CLOSTRIDIUM DIFFICILE BY PCR     Status: Normal   Collection Time   07/04/12 11:04 AM      Component Value Range Status Comment   C difficile by pcr NEGATIVE  NEGATIVE Final    Studies/Results: Dg Chest 2 View  07/03/2012  *RADIOLOGY REPORT*  Clinical Data: Fever.  CHEST - 2 VIEW  Comparison: 07/01/2012  Findings: Pulmonary hyperinflation again seen.  Both lungs are clear.  Heart size is at upper limits of normal in stable.  No mass or lymphadenopathy identified.  IMPRESSION: Stable exam.  No active disease.   Original Report Authenticated By: Danae Orleans, M.D.    Ir Removal Tun Cv Cath W/o Fl  07/03/2012  *RADIOLOGY REPORT*  Clinical Data/Indication: Infected right common femoral Hickman catheter.  TUNNEL CATHETER REMOVAL  Procedure: The procedure, risks, benefits, and alternatives were explained to the patient. Questions regarding the procedure were encouraged and answered. The patient understands and consents to the procedure.  The right groin was prepped with betadine in a sterile fashion, and a sterile drape was applied covering the operative field. A sterile gown and sterile gloves were used for the procedure.  1% lidocaine was utilized for local anesthesia. Utilizing blunt dissection, the cuff of the catheter was freed from the underlying subcutaneous tissue. The catheter was removed in its entirety. Hemostasis was achieved with direct pressure.  Complications: None.  IMPRESSION: Successful tunneled right common femoral vein Hickman catheter removal.   Original Report Authenticated By: Donavan Burnet, M.D.    Medications:  I have reviewed the patient's current medications. Scheduled Meds:   . calcium acetate  667 mg Oral TID WC  .  feeding supplement  1 Container Oral TID BM  . heparin  5,000 Units Subcutaneous Q8H  . multivitamin  1 tablet Oral QHS  . pantoprazole  40 mg Oral Q1200  . predniSONE  5 mg Oral Daily  . sodium chloride  3 mL Intravenous Q12H  . vancomycin  250 mg Intravenous Q Tue-HD  . vancomycin  500 mg Intravenous Q T,Th,Sa-HD   Continuous Infusions:  PRN Meds:.heparin, oxyCODONE-acetaminophen Assessment/Plan:  Mr. 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   1) Fever - The patient's fever is most likely 2/2 to bacteremia d/t catheter related infection. Surgery was consulted and they did not think his infection was coming from an abdominal source. CT abdomen on 10/7 showed no residual fluid collection in region of RLQ percutaneous drainage catheter or active infection. Emperic vancomycin, zosyn, and mycofungin were started on 10/10.  Peripheral blood cultures x2 were drawn 10/10 grew out coagulase negative staph. PICC line was removed by IR today, 10/12. Blood culture from the HD center on 10/12 grew  coagulase negative staph. Per ID consult 10/11, zosyn and mycofungin were D/C'd and vancomycin will be continued for a total of 14 days .  Cultures from his PICC line were drawn 10/11 and currently not growing any organisms. Patient had BM x6 on 10/12 but since yesterday no BM  --F/u PICC line cultures  - Will continue IV vancomycin for a total of 14 days - Will obtain repeat blood culture and will follow up - Will d/c patient today - No follow up appointment with ID need  2) Malnutrition - Currently receiving TNA. Albumin is 2.7 on admission and baseline prealbumin is ~ 25. Of note,  < 30 mg/dl indicative of malnutrition while on HD. Surgery was consulted and they are getting a calorie count to decide whether or not to continue TPN. Appreciate their consult. Since PICC line was d/c on 10/12 plan is to take a TPN holiday and then restart TPN once the infection clears using a PICC line in the same area. Patient has endorsed several times that he does not desire receiving further TPN and primary nephrologist has said that patient told him this multiple times. After PICC line removal, it will be appropriate to speak to the patient, along with surgery and nephrology, to discuss goals of therapy. Patient has very limited sites of access. See discussion below.  -  TNA holiday for now. Surgery would like to restart TNA if possible for better healing in the near future  - Encouraged po intake and is more then happy to do it . He noted he prefers to have small meal spread ed out instead of large big meals at one time   3) Line management - Patient currently has right peripheral IV, a PICC in his right femoral vein, percutaneous RLQ drain for a small bowel leak, and mature left femoral AV graft for HD. 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 Per surgery, if PICC line is removed but  the patient still requires TPN, will start TPN holiday with plan to reinsert PICC line at right femoral due to limited access options following antibiotic treatment. PICC line removed on 10/12 by IR.   4) Unilateral LE swelling - Patient has had left lower limb edema since d/c from the hospital in August. Dopplers at the time were negative for DVT. The major risk factor for DVT in this  patient is major surgery w/in 12 weeks. However, physical exam reveals a swollen left foot without erythema, pain, warmth, or ulceration. Low clinical suspicion for DVT. Other etiologies for this patient includes chronic venous insufficiency. LE doppler negative. ABI normal.  --Encourage ambulation, foot elevation and stockings   5) ESRD - HD was interrupted today. Patient received HD 10/12.  - HD per nephrology   6) Thrombocytopenia - The patient presents with a platelet count of 94. Currently 116. Baseline ranges from 120-161. Blood smear w/in normal limits.  --Will continue to monitor CBC   7) Sinus tachycardia - Has been documented in the past, but sporadic. Currently stable. TSH 0.26  - f/u free T4   8) Hepatitis C: Hepatitis panel was reactive for Hep C in 04/2012. Hepatitis B and HIV negative. Abdominal CT with contrast in 06/28/12 was negative for liver lesions suspicious for HCC. AFP tumor Negative   9) Gout - Currently stable, says that it improved after HD was started. Currently managed by 5mg  prednisone qDay. Allergic to allopurinol.  --Continue prednisone per primary nephrologist, Dr. Darrick Penna.   10) Anemia - likely 2/2 anemia of chronic inflammation. Iron panel in July 2013 shows low iron and UIBC and elevated ferritin.  --Continue to monitor H/H.   11) Prophylaxis:  --DVT: subcutaneous Heparin  --PUD: Protonix   12) Dispo: d/c home today   LOS: 4 days   Shayonna Ocampo 07/05/2012, 7:59 AM

## 2012-07-06 LAB — CULTURE, BLOOD (ROUTINE X 2)

## 2012-07-06 NOTE — Progress Notes (Signed)
Blood cultures ( from 07/01/12) results update: Sensitive to Oxacillin . Spoke with Nephrology PA  [name] and informed about d/c Vancomycin and starting Oxacillin. He will inform the HD center.  Rometta Emery, MD

## 2012-07-06 NOTE — Progress Notes (Signed)
Pt discharged to home after visit summary reviewed and pt capable of re verbalizing medications and follow up appointments. Pt remains stable. No signs and symptoms of distress. Educated to return to ER in the event of SOB, dizziness, chest pain, or fainting. Akin Yi, RN   

## 2012-07-07 LAB — CULTURE, BLOOD (ROUTINE X 2): Culture: NO GROWTH

## 2012-07-07 NOTE — Discharge Summary (Signed)
[name] coagulase negative staph is sensitive to oxacillin and hence he can finish a total of 14 days of oxacillin treatment. Discussed with Dr.Illath

## 2012-07-08 LAB — CULTURE, BLOOD (ROUTINE X 2)

## 2012-07-11 LAB — CULTURE, BLOOD (ROUTINE X 2): Culture: NO GROWTH

## 2012-07-16 ENCOUNTER — Ambulatory Visit (INDEPENDENT_AMBULATORY_CARE_PROVIDER_SITE_OTHER): Payer: Medicare Other | Admitting: Surgery

## 2012-07-16 ENCOUNTER — Encounter (INDEPENDENT_AMBULATORY_CARE_PROVIDER_SITE_OTHER): Payer: Self-pay | Admitting: Surgery

## 2012-07-16 VITALS — BP 116/70 | HR 88 | Temp 98.0°F | Ht 72.0 in | Wt 99.0 lb

## 2012-07-16 DIAGNOSIS — K632 Fistula of intestine: Secondary | ICD-10-CM

## 2012-07-16 NOTE — Patient Instructions (Signed)
Return 1 month

## 2012-07-16 NOTE — Progress Notes (Signed)
Subjective:     Patient ID: [name], male   DOB: October 20, 1960, 51 y.o.   MRN: [id number]  HPI Patient returns in followup for his enterocutaneous fistula. He was in the hospital about 3 weeks for fever secondary to a line infection. His pre-albumin is 20. He is tolerating a diet. He still has his right lower quadrant percutaneous drain in place and this is draining 20 cc a day but it does appear thick. His last CT scan done on 06/28/2012 showed no residual abscess in the right lower quadrant but a previous fistulogram showed communication with the small intestine  Review of Systems  Constitutional: Positive for fatigue. Negative for fever.  HENT: Negative.   Cardiovascular: Negative.   Gastrointestinal: Negative.   Musculoskeletal: Negative.        Objective:   Physical Exam  Abdominal:    Musculoskeletal: Normal range of motion.  Skin: Skin is warm and dry.  Psychiatric: He has a normal mood and affect. His behavior is normal. Judgment and thought content normal.       Assessment:     Enterocutaneous fistula PC malnutrition Patient Active Problem List  Diagnosis  . SBO (small bowel obstruction) s/p EL/LOA and SBR x 2  . CKD (chronic kidney disease) stage V requiring chronic dialysis  . Thrombocytopenia  . Gout  . HTN (hypertension)  . Chronic use of steroids  . Malnutrition, calorie  . Leg graft occlusion  . Acute respiratory failure with hypoxia  . Sinus tachycardia  . Abscess of abdominal cavity  . Wound dehiscence  . Bleeding  . Anemia  . ESRD (end stage renal disease) on dialysis  . Fever  . Gram-positive bacteremia  . Enterocutaneous fistula      Plan:     Improving.  Looks much better.  Return 1 month.  Continue drain.  Will need fistulogram next month.

## 2012-07-20 ENCOUNTER — Encounter (INDEPENDENT_AMBULATORY_CARE_PROVIDER_SITE_OTHER): Payer: Medicare Other | Admitting: Surgery

## 2012-07-30 ENCOUNTER — Encounter (INDEPENDENT_AMBULATORY_CARE_PROVIDER_SITE_OTHER): Payer: Medicare Other | Admitting: Surgery

## 2012-08-12 ENCOUNTER — Telehealth (INDEPENDENT_AMBULATORY_CARE_PROVIDER_SITE_OTHER): Payer: Self-pay | Admitting: General Surgery

## 2012-08-12 NOTE — Telephone Encounter (Signed)
Beth, nurse with [facility], called to let Dr. Luisa Hart know this pt is having 3-4 diarrheas QD.  Meds are not really effective.  Labs drawn yesterday at dialysis.

## 2012-08-13 ENCOUNTER — Ambulatory Visit (INDEPENDENT_AMBULATORY_CARE_PROVIDER_SITE_OTHER): Payer: Medicare Other | Admitting: Surgery

## 2012-08-13 ENCOUNTER — Encounter (INDEPENDENT_AMBULATORY_CARE_PROVIDER_SITE_OTHER): Payer: Self-pay | Admitting: Surgery

## 2012-08-13 ENCOUNTER — Other Ambulatory Visit (INDEPENDENT_AMBULATORY_CARE_PROVIDER_SITE_OTHER): Payer: Self-pay

## 2012-08-13 VITALS — BP 110/68 | HR 78 | Temp 97.7°F | Resp 12 | Ht 72.0 in | Wt 98.2 lb

## 2012-08-13 DIAGNOSIS — K632 Fistula of intestine: Secondary | ICD-10-CM

## 2012-08-13 MED ORDER — LOPERAMIDE HCL 2 MG PO TABS: 2.0000 mg | ORAL_TABLET | ORAL | Status: DC | PRN

## 2012-08-13 NOTE — Progress Notes (Signed)
Subjective:     Patient ID: [name], male   DOB: July 09, 1961, 51 y.o.   MRN: [id number]  HPI Patient returns in followup for his enterocutaneous fistula. He was in the hospital about 3 weeks for fever secondary to a line infection. His pre-albumin is 20. He is tolerating a diet. He still has his right lower quadrant percutaneous drain in place and this is draining 20 cc a day but it does appear thick. His last CT scan done on 06/28/2012 showed no residual abscess in the right lower quadrant but a previous fistulogram showed communication with the small intestine.  Pt complains of 4 to six formed or soft BM per day.  This is annoying him.  Review of Systems  Constitutional: Positive for fatigue. Negative for fever.  HENT: Negative.   Cardiovascular: Negative.   Gastrointestinal: Negative.   Musculoskeletal: Negative.        Objective:   Physical Exam  Abdominal:    Musculoskeletal: Normal range of motion.  Skin: Skin is warm and dry.  Psychiatric: He has a normal mood and affect. His behavior is normal. Judgment and thought content normal.       Assessment:     Enterocutaneous fistula PC malnutrition Patient Active Problem List  Diagnosis  . SBO (small bowel obstruction) s/p EL/LOA and SBR x 2  . CKD (chronic kidney disease) stage V requiring chronic dialysis  . Thrombocytopenia  . Gout  . HTN (hypertension)  . Chronic use of steroids  . Malnutrition, calorie  . Leg graft occlusion  . Acute respiratory failure with hypoxia  . Sinus tachycardia  . Abscess of abdominal cavity  . Wound dehiscence  . Bleeding  . Anemia  . ESRD (end stage renal disease) on dialysis  . Fever  . Gram-positive bacteremia  . Enterocutaneous fistula      Plan:     Check fistulogram.  Add imodium.  Return 1 month .  Hopefully pull drain.

## 2012-08-13 NOTE — Patient Instructions (Signed)
Will set up test on drain.  Take 1/2 imodium tablet daily for bowels.

## 2012-08-16 ENCOUNTER — Ambulatory Visit (HOSPITAL_COMMUNITY)
Admission: RE | Admit: 2012-08-16 | Discharge: 2012-08-16 | Disposition: A | Discharge status: Home / Self Care | Payer: Medicare Other | Source: Ambulatory Visit | Attending: Surgery | Admitting: Surgery

## 2012-08-16 DIAGNOSIS — K632 Fistula of intestine: Secondary | ICD-10-CM | POA: Insufficient documentation

## 2012-08-16 NOTE — Procedures (Signed)
Persistent communication with right lower quadrant drainage catheter and an adjacent loop of bowl (likely small bowel) within the right lower abdominal quadrant.  As such, drainage catheter was not remove, rather it was reconnected to a gravity bag.

## 2012-08-27 ENCOUNTER — Telehealth (INDEPENDENT_AMBULATORY_CARE_PROVIDER_SITE_OTHER): Payer: Self-pay

## 2012-08-27 ENCOUNTER — Other Ambulatory Visit (INDEPENDENT_AMBULATORY_CARE_PROVIDER_SITE_OTHER): Payer: Self-pay

## 2012-08-27 NOTE — Telephone Encounter (Signed)
The nurse from [facility] called and wants to know if Dr Luisa Hart would refill the Imodium.  The pt is still going 4 -5 times a day.  He has no fever and no blood in the stool.  He is still using the [facility].  He has a follow up on 12/16.

## 2012-08-30 ENCOUNTER — Telehealth (INDEPENDENT_AMBULATORY_CARE_PROVIDER_SITE_OTHER): Payer: Self-pay

## 2012-08-30 NOTE — Telephone Encounter (Signed)
OTC

## 2012-08-30 NOTE — Telephone Encounter (Signed)
Called patient to let him know that the imodium is OTC and he does not need prescription. Phone was busy every time I called him. I will continue to try and call him. Please give him message should he call back.

## 2012-09-01 ENCOUNTER — Telehealth (INDEPENDENT_AMBULATORY_CARE_PROVIDER_SITE_OTHER): Payer: Self-pay

## 2012-09-01 NOTE — Telephone Encounter (Signed)
Message copied by Brennan Bailey on Wed Sep 01, 2012  8:54 AM ------      Message from: Marin Shutter      Created: Wed Sep 01, 2012  8:39 AM      Regarding: Dr. Luisa Hart       Beth/[facility] [phone]) called to let you know that the pt's orders are running out this week, for drain.  Pt is seeing Dr. Salena Saner on Monday.  Waynetta Sandy is wondering if Dr. Salena Saner will be pulling the drain or keeping it in. Does he need more orders? Thx

## 2012-09-01 NOTE — Telephone Encounter (Signed)
I called Beth back and told her I was not clear on status of getting his drain pulled. I told her to extend the orders so he is covered if drain stays in place. If drain does get pulled then we can discontinue the orders at that time.

## 2012-09-06 ENCOUNTER — Encounter (INDEPENDENT_AMBULATORY_CARE_PROVIDER_SITE_OTHER): Payer: Self-pay | Admitting: Surgery

## 2012-09-06 ENCOUNTER — Ambulatory Visit (INDEPENDENT_AMBULATORY_CARE_PROVIDER_SITE_OTHER): Payer: Medicare Other | Admitting: Surgery

## 2012-09-06 VITALS — BP 108/72 | HR 76 | Temp 97.6°F | Resp 14 | Ht 72.0 in | Wt 102.8 lb

## 2012-09-06 DIAGNOSIS — K632 Fistula of intestine: Secondary | ICD-10-CM

## 2012-09-06 NOTE — Patient Instructions (Signed)
Return in 6-8 weeks

## 2012-09-06 NOTE — Progress Notes (Signed)
Subjective:     Patient ID: [name], male   DOB: 11-09-1960, 51 y.o.   MRN: [id number]  HPI Patient returns in followup for his enterocutaneous fistula. He was in the hospital about 3 weeks for fever secondary to a line infection. His pre-albumin is 20. He is tolerating a diet. He still has his right lower quadrant percutaneous drain in place and this is draining 20 cc a day but it does appear thick. His last CT scan done on 06/28/2012 showed no residual abscess in the right lower quadrant but a previous fistulogram showed communication with the small intestine.  Pt complains of 4 to six formed or soft BM per day.  This is annoying him.  Review of Systems  Constitutional: Positive for fatigue. Negative for fever.  HENT: Negative.   Cardiovascular: Negative.   Gastrointestinal: Negative.   Musculoskeletal: Negative.        Objective:   Physical Exam  Abdominal: drain with 50 -100 cc day of succus drainage.  Wound healed   Musculoskeletal: Normal range of motion.  Skin: Skin is warm and dry.  Psychiatric: He has a normal mood and affect. His behavior is normal. Judgment and thought content normal.       Assessment:     Enterocutaneous fistula PC malnutrition Patient Active Problem List  Diagnosis  . SBO (small bowel obstruction) s/p EL/LOA and SBR x 2  . CKD (chronic kidney disease) stage V requiring chronic dialysis  . Thrombocytopenia  . Gout  . HTN (hypertension)  . Chronic use of steroids  . Malnutrition, calorie  . Leg graft occlusion  . Acute respiratory failure with hypoxia  . Sinus tachycardia  . Abscess of abdominal cavity  . Wound dehiscence  . Bleeding  . Anemia  . ESRD (end stage renal disease) on dialysis  . Fever  . Gram-positive bacteremia  . Enterocutaneous fistula      Plan:     Cont drain for now.  May be on verge of short gut syndrome making any further resection risky.  Recheck fistula gram in 6- 8 weeks.

## 2012-09-20 ENCOUNTER — Emergency Department (HOSPITAL_COMMUNITY)
Admission: EM | Admit: 2012-09-20 | Discharge: 2012-09-21 | Disposition: A | Discharge status: Home Health | Payer: Medicare Other | Attending: Emergency Medicine | Admitting: Emergency Medicine

## 2012-09-20 ENCOUNTER — Telehealth (INDEPENDENT_AMBULATORY_CARE_PROVIDER_SITE_OTHER): Payer: Self-pay

## 2012-09-20 ENCOUNTER — Encounter (HOSPITAL_COMMUNITY): Payer: Self-pay | Admitting: Emergency Medicine

## 2012-09-20 DIAGNOSIS — T819XXA Unspecified complication of procedure, initial encounter: Secondary | ICD-10-CM

## 2012-09-20 DIAGNOSIS — T8140XA Infection following a procedure, unspecified, initial encounter: Secondary | ICD-10-CM | POA: Insufficient documentation

## 2012-09-20 DIAGNOSIS — I5022 Chronic systolic (congestive) heart failure: Secondary | ICD-10-CM | POA: Insufficient documentation

## 2012-09-20 DIAGNOSIS — I120 Hypertensive chronic kidney disease with stage 5 chronic kidney disease or end stage renal disease: Secondary | ICD-10-CM | POA: Insufficient documentation

## 2012-09-20 DIAGNOSIS — Z79899 Other long term (current) drug therapy: Secondary | ICD-10-CM | POA: Insufficient documentation

## 2012-09-20 DIAGNOSIS — Z8546 Personal history of malignant neoplasm of prostate: Secondary | ICD-10-CM | POA: Insufficient documentation

## 2012-09-20 DIAGNOSIS — Y838 Other surgical procedures as the cause of abnormal reaction of the patient, or of later complication, without mention of misadventure at the time of the procedure: Secondary | ICD-10-CM | POA: Insufficient documentation

## 2012-09-20 DIAGNOSIS — Z87891 Personal history of nicotine dependence: Secondary | ICD-10-CM | POA: Insufficient documentation

## 2012-09-20 DIAGNOSIS — Z862 Personal history of diseases of the blood and blood-forming organs and certain disorders involving the immune mechanism: Secondary | ICD-10-CM | POA: Insufficient documentation

## 2012-09-20 DIAGNOSIS — N186 End stage renal disease: Secondary | ICD-10-CM | POA: Insufficient documentation

## 2012-09-20 DIAGNOSIS — Z992 Dependence on renal dialysis: Secondary | ICD-10-CM | POA: Insufficient documentation

## 2012-09-20 DIAGNOSIS — Z87448 Personal history of other diseases of urinary system: Secondary | ICD-10-CM | POA: Insufficient documentation

## 2012-09-20 NOTE — Telephone Encounter (Signed)
Patient calling into office to report dark red drainage at his drain site.  Patient denies having any fever, nausea or vomiting.  Patient reports an increase in discomfort.  Patient can be seen by Interventional Radiologist on Thursday 09/23/12 for [facility], patient did not want to wait until Thursday.  Recommended patient go to the Emergency Department for further evaluation.  Patient advised to go to [facility] ED because he's a Dialysis patient.  Patient will call our office Tuesday 09/21/12 to schedule a follow up appointment.

## 2012-09-20 NOTE — ED Provider Notes (Addendum)
History     CSN: [id number]  Arrival date & time 09/20/12  1749   First MD Initiated Contact with Patient 09/20/12 2259      Chief Complaint  Patient presents with  . Drainage from Incision    (Consider location/radiation/quality/duration/timing/severity/associated sxs/prior treatment) HPI History provided by pt and prior chart. Per prior chart, pt admitted for SBO in 03/2012.  Had exploratory lap w/ lysis of adhesions and bowel resection, and then developed leakage from anastomosis followed by intr-abdominal abscess and enterocutaneous fistula.  A drain was placed in RLQ.  Had f/u for drain on 09/03/12 and looked well at that time.  Comes to ED today because there is blood on the bandages surrounding drain and this has never occurred in the past.  Believes he may have pulled drain out in his sleep. Denies fever, abdominal pain, N/V, change in bowels.  Able to pass gas.   Past Medical History  Diagnosis Date  . ESRD needing dialysis   . Prostate cancer   . Hypertension   . Renal transplant disorder 1997    Now HD   . Gout tophi   . Chronic steroid use     For Gout transplant. Has sever gout. Did not tolerate Allopurinol. Do not taper per PCP   . Systolic CHF, chronic     2 D echo 04/2012 with EF of 45 %   . Malnutrition   . 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 F/U with Dr Dwain Sarna. Started on TNA  . Anemia associated with chronic renal failure   . Hepatitis C     Past Surgical History  Procedure Date  . Laparotomy 04/14/2012    Procedure: EXPLORATORY LAPAROTOMY;  Surgeon: Clovis Pu. Cornett, MD;  Location: [facility] OR;  Service: General;  Laterality: N/A;  . Colon resection 04/14/2012  . Laparotomy 04/22/2012    Procedure: EXPLORATORY LAPAROTOMY;  Surgeon: Ardeth Sportsman, MD;  Location: [facility] OR;  Service: General;  Laterality: N/A;  lysis of adhesions, omentoplasty, repair small bowel    . Thrombectomy w/ embolectomy 04/27/2012    Procedure: THROMBECTOMY ARTERIOVENOUS GORE-TEX GRAFT;  Surgeon: Chuck Hint, MD;  Location: [facility] OR;  Service: Vascular;  Laterality: Left;  Thrombectomy of left thigh arteriovenous gortex graft  . Prostectomy 2011    Family History  Problem Relation Age of Onset  . Hypertension Father   . Pneumonia Brother   . Lung disease Brother     History  Substance Use Topics  . Smoking status: Former Games developer  . Smokeless tobacco: Not on file     Comment: Quit 30 years ago  . Alcohol Use: No      Review of Systems  All other systems reviewed and are negative.    Allergies  Allopurinol and Aspirin  Home Medications   Current Outpatient Rx  Name  Route  Sig  Dispense  Refill  . CALCIUM ACETATE 667 MG PO CAPS   Oral   Take 1,334 mg by mouth 3 (three) times daily with meals. Takes 2 capsules with snacks         . CALCIUM CARBONATE ANTACID 750 MG PO CHEW   Oral   Chew 1-2 tablets by mouth daily as needed. For heartburn         . LIDOCAINE EX   Apply externally   Apply 1 application topically See admin instructions. Apply to inject site on  dialysis days (Tues, Thur, Sat) if needed for pain         . LOPERAMIDE HCL 2 MG PO TABS   Oral   Take 2 mg by mouth daily as needed. For diarrhea         . RENA-VITE PO TABS   Oral   Take 1 tablet by mouth daily.   30 tablet   0   . OMEPRAZOLE 40 MG PO CPDR   Oral   Take 1 capsule (40 mg total) by mouth daily.   30 capsule   0   . OXYCODONE-ACETAMINOPHEN 5-325 MG PO TABS   Oral   Take 1 tablet by mouth every 6 (six) hours as needed. For pain         . PREDNISONE 5 MG PO TABS   Oral   Take 5 mg by mouth daily.           BP 145/75  Pulse 100  Temp 98.1 F (36.7 C) (Oral)  Resp 20  SpO2 97%  Physical Exam  Nursing note and vitals reviewed. Constitutional: He is oriented to person, place, and time. He appears well-developed. No distress.       Cachectic    HENT:  Head: Normocephalic and atraumatic.  Eyes:       Normal appearance  Neck: Normal range of motion.  Cardiovascular: Normal rate and regular rhythm.   Pulmonary/Chest: Effort normal and breath sounds normal. No respiratory distress.  Abdominal: Soft. Bowel sounds are normal. He exhibits no distension and no mass. There is no tenderness. There is no rebound and no guarding.       Percutaneous drain in RLQ.  There is a small amount of purulent drainage at drain site as well as on bandage.  There is also blood on bandage.  The drain appears to have been pulled out ~3in based on location of sutures.   Genitourinary:       No CVA tenderness  Musculoskeletal: Normal range of motion.  Neurological: He is alert and oriented to person, place, and time.  Skin: Skin is warm and dry. No rash noted.  Psychiatric: He has a normal mood and affect. His behavior is normal.    ED Course  Procedures (including critical care time)  Labs Reviewed  CBC WITH DIFFERENTIAL - Abnormal; Notable for the following:    RDW 16.6 (*)     Platelets 120 (*)     All other components within normal limits  BASIC METABOLIC PANEL - Abnormal; Notable for the following:    Potassium 3.3 (*)     BUN 30 (*)     Creatinine, Ser 6.19 (*)     GFR calc non Af Amer 9 (*)     GFR calc Af Amer 11 (*)     All other components within normal limits  LACTIC ACID, PLASMA   Ct Abdomen Pelvis Wo Contrast  09/21/2012  *RADIOLOGY REPORT*  Clinical Data: Abdominal pain; increased drainage from drainage catheter.  CT ABDOMEN AND PELVIS WITHOUT CONTRAST  Technique:  Multidetector CT imaging of the abdomen and pelvis was performed following the standard protocol without intravenous contrast.  Comparison: CT of the abdomen and pelvis performed 06/28/2012  Findings: The visualized lung bases are clear.  A small to moderate amount of free fluid is noted about the inferior tip of the liver, more prominent than on the prior study. The liver  is otherwise unremarkable in appearance.  The spleen is within normal limits.  The  gallbladder is grossly unremarkable in appearance.  The pancreas and adrenal glands are unremarkable.  There is chronic bilateral renal atrophy.  The 3.3 cm somewhat complex left renal cystic lesion is grossly unchanged from the prior CT.  A 1.3 cm hyperdense cyst is noted at the interpole region of the right kidney.  Scattered additional bilateral renal cysts are seen.  No perinephric stranding is appreciated.  No hydronephrosis is appreciated.  No renal or ureteral stones are identified.  The small bowel is unremarkable in appearance.  The stomach is within normal limits.  No acute vascular abnormalities are seen. Diffuse calcification is noted along the abdominal aorta and its branches, with marked calcification of vessels along the anterior abdominal wall.  There appears to be severe focal stenosis at the right common femoral artery, due to calcific atherosclerotic disease.  Soft tissue density at the left inguinal region is noted surrounding the left common femoral vessels.  The appendix is not definitely characterized; there is no evidence for appendicitis.  Contrast progresses to the level of the descending colon.  Scattered diverticula are noted about the entirety of the colon.  The colon is largely filled with stool.  The bladder is decompressed, with diffuse bladder wall thickening and soft tissue inflammation extending to the region of soft tissue inflammation at the right lower quadrant.  There is diffuse renal cortical thinning at the patient's left iliac fossa transplant kidney, with associated parenchymal calcification.  The patient's drainage catheter is noted at the anterior right lower quadrant, with associated soft tissue inflammation.  No definite abscess is identified.  No inguinal lymphadenopathy is seen.  No acute osseous abnormalities are identified.  IMPRESSION:  1.  The degree of soft tissue inflammation at  the anterior right lower quadrant appears have decreased from the prior study; the associated drainage catheter is grossly unremarkable in appearance. No evidence of abscess. 2.  Associated soft tissue inflammation noted about the decompressed bladder, grossly unchanged in appearance. 3.  The amount of fluid about the inferior tip of the liver has increased from the prior study. 4.  Diffuse vascular calcifications seen; severe focal stenosis at the right common femoral artery, due to calcific atherosclerotic disease.  Stable soft tissue density at the left inguinal region surrounding the left common femoral vessels, possibly reflecting remote instrumentation. 5.  Marked atrophy of the medial kidneys, and marked cortical thinning at the left transplant kidney.  Bilateral renal cystic lesions noted, complex on the left side.   Original Report Authenticated By: Tonia Ghent, M.D.      1. Postoperative complication       MDM  51yo chronically ill M w/ percutaneous drain in RLQ, s/p SBO --> ex lap w/ bowel resection -->anasamosis leak, intra-abdominal abscess and enterocutaneous fistula in 03/2012, presents w/ c/o blooding from drain site.  It appears that patient pulled drain out in his sleep, approx 3 inches.  There is also purulent fluid on bandage.  Afebrile and abd otherwise benign and pt denies N/V and change in bowels.  Consulted Dr. Andrey Campanile w/ [facility] and he recommends that patient be discharged home and f/u with interventional radiology in 3 days.  Patient is very concerned that he will develop an infection and I am concerned that he may have a recurring abscess.  Discussed w/ Dr. Dierdre Highman and he recommends CT abd/pelvis.  Results pending.    No evidence of abscess or other abdominal infection on CT.  Results discussed w/ pt.  Advised him to f/u with  IR as scheduled on Thursday.  Return precautions discussed. 5:19 AM        Otilio Miu, PA-C 09/21/12 0519  Otilio Miu,  PA-C 09/21/12 8119

## 2012-09-20 NOTE — ED Notes (Signed)
Pt here with c/o drainage from incision site onset today. Pt reports had drainage tube in august because abdominal obstruction.

## 2012-09-21 ENCOUNTER — Other Ambulatory Visit (HOSPITAL_COMMUNITY): Payer: Self-pay | Admitting: Emergency Medicine

## 2012-09-21 ENCOUNTER — Emergency Department (HOSPITAL_COMMUNITY): Payer: Medicare Other

## 2012-09-21 ENCOUNTER — Telehealth (INDEPENDENT_AMBULATORY_CARE_PROVIDER_SITE_OTHER): Payer: Self-pay

## 2012-09-21 DIAGNOSIS — L0291 Cutaneous abscess, unspecified: Secondary | ICD-10-CM

## 2012-09-21 LAB — BASIC METABOLIC PANEL
CO2: 27 mEq/L (ref 19–32)
Calcium: 10 mg/dL (ref 8.4–10.5)
Chloride: 97 mEq/L (ref 96–112)
Creatinine, Ser: 6.19 mg/dL — ABNORMAL HIGH (ref 0.50–1.35)
Glucose, Bld: 99 mg/dL (ref 70–99)
Sodium: 140 mEq/L (ref 135–145)

## 2012-09-21 LAB — CBC WITH DIFFERENTIAL/PLATELET
Eosinophils Relative: 0 % (ref 0–5)
HCT: 42.1 % (ref 39.0–52.0)
Lymphocytes Relative: 24 % (ref 12–46)
Lymphs Abs: 1.7 10*3/uL (ref 0.7–4.0)
MCV: 84.9 fL (ref 78.0–100.0)
Monocytes Absolute: 0.9 10*3/uL (ref 0.1–1.0)
Neutro Abs: 4.6 10*3/uL (ref 1.7–7.7)
RBC: 4.96 MIL/uL (ref 4.22–5.81)
WBC: 7.3 10*3/uL (ref 4.0–10.5)

## 2012-09-21 LAB — LACTIC ACID, PLASMA: Lactic Acid, Venous: 0.9 mmol/L (ref 0.5–2.2)

## 2012-09-21 MED ORDER — GI COCKTAIL ~~LOC~~
30.0000 mL | Freq: Once | ORAL | Status: AC
  Administered 2012-09-21: 30 mL via ORAL
  Filled 2012-09-21: qty 30

## 2012-09-21 MED ORDER — IOHEXOL 300 MG/ML  SOLN
20.0000 mL | INTRAMUSCULAR | Status: AC
  Administered 2012-09-21: 20 mL via ORAL

## 2012-09-21 NOTE — ED Provider Notes (Signed)
Medical screening examination/treatment/procedure(s) were performed by non-physician practitioner and as supervising physician I was immediately available for consultation/collaboration.  Kailei Cowens, MD 09/21/12 0746 

## 2012-09-21 NOTE — Telephone Encounter (Signed)
Pt called stating he was seen in ER yesterday and instructed to keep appt with IVR on Thursday 09-23-12 but pt did not know time. Since this was scheduled by ER MD  I gave pt the # to IVR to verify his time and any instructions prior to appt.. Pt will call with any other concerns.

## 2012-09-22 NOTE — ED Provider Notes (Signed)
Medical screening examination/treatment/procedure(s) were performed by non-physician practitioner and as supervising physician I was immediately available for consultation/collaboration.  Sunnie Nielsen, MD 09/22/12 [phone]

## 2012-09-23 ENCOUNTER — Telehealth (INDEPENDENT_AMBULATORY_CARE_PROVIDER_SITE_OTHER): Payer: Self-pay | Admitting: General Surgery

## 2012-09-23 ENCOUNTER — Ambulatory Visit (HOSPITAL_COMMUNITY)
Admission: RE | Admit: 2012-09-23 | Discharge: 2012-09-23 | Disposition: A | Discharge status: Home / Self Care | Payer: Medicare Other | Source: Ambulatory Visit | Attending: Emergency Medicine | Admitting: Emergency Medicine

## 2012-09-23 ENCOUNTER — Other Ambulatory Visit (HOSPITAL_COMMUNITY): Payer: Self-pay | Admitting: Emergency Medicine

## 2012-09-23 DIAGNOSIS — L0291 Cutaneous abscess, unspecified: Secondary | ICD-10-CM

## 2012-09-23 DIAGNOSIS — Z4682 Encounter for fitting and adjustment of non-vascular catheter: Secondary | ICD-10-CM | POA: Insufficient documentation

## 2012-09-23 MED ORDER — ALUM & MAG HYDROXIDE-SIMETH 200-200-20 MG/5ML PO SUSP
30.0000 mL | Freq: Once | ORAL | Status: AC
  Administered 2012-09-23: 30 mL via ORAL
  Filled 2012-09-23: qty 30

## 2012-09-23 MED ORDER — IOHEXOL 300 MG/ML  SOLN
50.0000 mL | Freq: Once | INTRAMUSCULAR | Status: AC | PRN
  Administered 2012-09-23: 5 mL

## 2012-09-23 NOTE — Procedures (Signed)
Positive fistula Tube changed 10 Fr. No comp

## 2012-09-23 NOTE — Telephone Encounter (Signed)
Beth,nurse with [facility], called to inform Dr. Luisa Hart about this pt's worsening reflux.  He takes Prilosce 40 mg QD.  For the last 10 days, pt reports burning in his throat and chest.  Please advise if you want to change medicine.  His next appt in the office is 10/18/12.

## 2012-09-23 NOTE — Telephone Encounter (Signed)
Not treating his reflux.  Needs to talk with primary care

## 2012-09-28 ENCOUNTER — Other Ambulatory Visit: Payer: Self-pay

## 2012-09-29 ENCOUNTER — Encounter (HOSPITAL_COMMUNITY)
Admission: RE | Disposition: A | Discharge status: Home / Self Care | Payer: Self-pay | Source: Ambulatory Visit | Attending: Surgery

## 2012-09-29 ENCOUNTER — Ambulatory Visit (HOSPITAL_COMMUNITY)
Admission: RE | Admit: 2012-09-29 | Discharge: 2012-09-29 | Disposition: A | Discharge status: Home / Self Care | Payer: Medicare Other | Source: Ambulatory Visit | Attending: Surgery | Admitting: Surgery

## 2012-09-29 DIAGNOSIS — N186 End stage renal disease: Secondary | ICD-10-CM | POA: Insufficient documentation

## 2012-09-29 DIAGNOSIS — Z992 Dependence on renal dialysis: Secondary | ICD-10-CM | POA: Insufficient documentation

## 2012-09-29 DIAGNOSIS — I120 Hypertensive chronic kidney disease with stage 5 chronic kidney disease or end stage renal disease: Secondary | ICD-10-CM | POA: Insufficient documentation

## 2012-09-29 DIAGNOSIS — T82898A Other specified complication of vascular prosthetic devices, implants and grafts, initial encounter: Secondary | ICD-10-CM

## 2012-09-29 DIAGNOSIS — Y832 Surgical operation with anastomosis, bypass or graft as the cause of abnormal reaction of the patient, or of later complication, without mention of misadventure at the time of the procedure: Secondary | ICD-10-CM | POA: Insufficient documentation

## 2012-09-29 DIAGNOSIS — IMO0002 Reserved for concepts with insufficient information to code with codable children: Secondary | ICD-10-CM | POA: Insufficient documentation

## 2012-09-29 DIAGNOSIS — Z940 Kidney transplant status: Secondary | ICD-10-CM | POA: Insufficient documentation

## 2012-09-29 DIAGNOSIS — I871 Compression of vein: Secondary | ICD-10-CM | POA: Insufficient documentation

## 2012-09-29 DIAGNOSIS — D631 Anemia in chronic kidney disease: Secondary | ICD-10-CM | POA: Insufficient documentation

## 2012-09-29 DIAGNOSIS — N039 Chronic nephritic syndrome with unspecified morphologic changes: Secondary | ICD-10-CM | POA: Insufficient documentation

## 2012-09-29 HISTORY — PX: SHUNTOGRAM: SHX5491

## 2012-09-29 LAB — POCT I-STAT, CHEM 8
BUN: 33 mg/dL — ABNORMAL HIGH (ref 6–23)
Chloride: 106 mEq/L (ref 96–112)
Creatinine, Ser: 7 mg/dL — ABNORMAL HIGH (ref 0.50–1.35)
Sodium: 138 mEq/L (ref 135–145)
TCO2: 27 mmol/L (ref 0–100)

## 2012-09-29 SURGERY — ASSESSMENT, SHUNT FUNCTION, WITH CONTRAST RADIOGRAPHIC STUDY
Anesthesia: LOCAL | Laterality: Left

## 2012-09-29 MED ORDER — HEPARIN SODIUM (PORCINE) 1000 UNIT/ML IJ SOLN
INTRAMUSCULAR | Status: AC
  Filled 2012-09-29: qty 1

## 2012-09-29 MED ORDER — OXYCODONE HCL 5 MG PO TABS: 5.0000 mg | ORAL_TABLET | ORAL | Status: DC | PRN

## 2012-09-29 MED ORDER — ONDANSETRON HCL 4 MG/2ML IJ SOLN: 4.0000 mg | Freq: Four times a day (QID) | INTRAMUSCULAR | Status: DC | PRN

## 2012-09-29 MED ORDER — HYDRALAZINE HCL 20 MG/ML IJ SOLN: 10.0000 mg | INTRAMUSCULAR | Status: DC | PRN

## 2012-09-29 MED ORDER — FENTANYL CITRATE 0.05 MG/ML IJ SOLN
INTRAMUSCULAR | Status: AC
  Filled 2012-09-29: qty 2

## 2012-09-29 MED ORDER — LABETALOL HCL 5 MG/ML IV SOLN: 10.0000 mg | INTRAVENOUS | Status: DC | PRN

## 2012-09-29 MED ORDER — PHENOL 1.4 % MT LIQD: 1.0000 | OROMUCOSAL | Status: DC | PRN

## 2012-09-29 MED ORDER — HEPARIN (PORCINE) IN NACL 2-0.9 UNIT/ML-% IJ SOLN
INTRAMUSCULAR | Status: AC
  Filled 2012-09-29: qty 500

## 2012-09-29 MED ORDER — ACETAMINOPHEN 325 MG RE SUPP: 325.0000 mg | RECTAL | Status: DC | PRN

## 2012-09-29 MED ORDER — LIDOCAINE HCL (PF) 1 % IJ SOLN
INTRAMUSCULAR | Status: AC
  Filled 2012-09-29: qty 30

## 2012-09-29 MED ORDER — METOPROLOL TARTRATE 1 MG/ML IV SOLN: 2.0000 mg | INTRAVENOUS | Status: DC | PRN

## 2012-09-29 MED ORDER — SODIUM CHLORIDE 0.9 % IJ SOLN: 3.0000 mL | INTRAMUSCULAR | Status: DC | PRN

## 2012-09-29 MED ORDER — GUAIFENESIN-DM 100-10 MG/5ML PO SYRP: 15.0000 mL | ORAL_SOLUTION | ORAL | Status: DC | PRN

## 2012-09-29 MED ORDER — ALUM & MAG HYDROXIDE-SIMETH 200-200-20 MG/5ML PO SUSP: 15.0000 mL | ORAL | Status: DC | PRN

## 2012-09-29 MED ORDER — ACETAMINOPHEN 325 MG PO TABS: 325.0000 mg | ORAL_TABLET | ORAL | Status: DC | PRN

## 2012-09-29 NOTE — Op Note (Signed)
[facility]  Patient name: [name] MRN: [id number] DOB: 01-24-1961 Sex: male  09/29/2012 Pre-operative Diagnosis: End-stage renal disease Post-operative diagnosis:  Same Surgeon:  Jorge Ny Procedure Performed:  1.  ultrasound left thigh graft  2.  shuntogram  3.  angioplasty left common femoral vein  4.  followup x2   Indications:  The patient is having trouble with dialysis. He uses a left thigh graft which is his last access. He comes in today for further evaluation  Procedure:  The patient was identified in the holding area and taken to room 8.  The patient was then placed supine on the table and prepped and draped in the usual sterile fashion.  A time out was called.  Ultrasound was used to evaluate the fistula.  The vein was patent and compressible.  A digital ultrasound image was acquired.  The fistula was then accessed under ultrasound guidance using a micropuncture needle.  An 018 wire was then asvanced without resistance and a micropuncture sheath was placed.  Contrast injections were then performed through the sheath.  Findings:  The thigh graft is widely patent throughout it's course. There is a high-grade, approximately 80% stenosis within the native common femoral vein. The iliac venous system remains widely patent. The arterial  anastomosis is widely patent.   Intervention:  After the above images were obtained, the decision was made to proceed with intervention. Over a 035 wire, a 7 French sheath was inserted. 4000 units of heparin were administered. The wire was easily advanced across the stenosis. I initially used a 5 x 40 Mustang balloon. This was taken to burst pressure for 1 minute. Followup arteriogram revealed improved but suboptimal results. I elected to up size to a 8 x 40 Mustang balloon. This was taken to grade pressure for 1 minute. Followup angiography revealed again, improved but suboptimal result. I then upsized to a 10 x  40 Mustang balloon. This was taken to 12 atmospheres for 90 seconds. Followup angiogram again revealed improved results. A did not feel that we had maximally dilated the balloon, however I felt that with further upsizing, the vein would be at risk for her. Since there was a significant improvement in flow through this area with decreased visualization of collaterals, I elected to terminate the procedure. The sheath was removed using a pursestring 3-0 nylon suture  Impression:  #1  successful angioplasty of a 80% left common femoral vein stenosis. Maximum diameter of the balloon utilized was a 10 mm  #2  the patient remained amenable to percutaneous intervention      V. Durene Cal, M.D. [facility] Office: [phone] Pager:  [phone]

## 2012-09-29 NOTE — H&P (Signed)
[facility] [place]   Patient name: [name] MRN: [id number] DOB: 20-Jun-1961 Sex: male    No chief complaint on file.   HISTORY OF PRESENT ILLNESS: HD via left thigh graft with difficulties.   Patient has a history of revision in the past.  Past Medical History  Diagnosis Date  . ESRD needing dialysis   . Prostate cancer   . Hypertension   . Renal transplant disorder 1997    Now HD   . Gout tophi   . Chronic steroid use     For Gout transplant. Has sever gout. Did not tolerate Allopurinol. Do not taper per PCP   . Systolic CHF, chronic     2 D echo 04/2012 with EF of 45 %   . Malnutrition   . 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 F/U with Dr Dwain Sarna. Started on TNA  . Anemia associated with chronic renal failure   . Hepatitis C     Past Surgical History  Procedure Date  . Laparotomy 04/14/2012    Procedure: EXPLORATORY LAPAROTOMY;  Surgeon: Clovis Pu. Cornett, MD;  Location: [facility] OR;  Service: General;  Laterality: N/A;  . Colon resection 04/14/2012  . Laparotomy 04/22/2012    Procedure: EXPLORATORY LAPAROTOMY;  Surgeon: Ardeth Sportsman, MD;  Location: [facility] OR;  Service: General;  Laterality: N/A;  lysis of adhesions, omentoplasty, repair small bowel  . Thrombectomy w/ embolectomy 04/27/2012    Procedure: THROMBECTOMY ARTERIOVENOUS GORE-TEX GRAFT;  Surgeon: Chuck Hint, MD;  Location: [facility] OR;  Service: Vascular;  Laterality: Left;  Thrombectomy of left thigh arteriovenous gortex graft  . Prostectomy 2011    History   Social History  . Marital Status: Single    Spouse Name: N/A    Number of Children: N/A  . Years of Education: N/A   Occupational History  . Not on file.   Social History Main Topics  . Smoking status: Former Games developer  . Smokeless tobacco: Not on file     Comment: Quit 30 years ago  . Alcohol Use: No  . Drug Use: No  .  Sexually Active: No   Other Topics Concern  . Not on file   Social History Narrative   Currently in the nursing home until end of the month then will go to live with sister.     Family History  Problem Relation Age of Onset  . Hypertension Father   . Pneumonia Brother   . Lung disease Brother     Allergies as of 09/28/2012 - Review Complete 09/23/2012  Allergen Reaction Noted  . Allopurinol Other (See Comments) 02/27/2010  . Aspirin Other (See Comments) 02/27/2010    Current Facility-Administered Medications on File Prior to Encounter  Medication Dose Route Frequency Provider Last Rate Last Dose  . fentaNYL (SUBLIMAZE) injection   Intravenous PRN Abundio Miu, MD   25 mcg at 05/05/12 1544  . midazolam (VERSED) 5 MG/5ML injection   Intravenous PRN Abundio Miu, MD   2 mg at 05/05/12 1544   Current Outpatient Prescriptions on File Prior to Encounter  Medication Sig Dispense Refill  . calcium acetate (PHOSLO) 667 MG capsule Take 1,334 mg by mouth 3 (three) times daily with meals. Takes 2 capsules with snacks      . calcium carbonate (TUMS EX) 750 MG chewable tablet Chew 1-2 tablets by mouth daily as needed. For  heartburn      . loperamide (IMODIUM A-D) 2 MG tablet Take 2 mg by mouth daily as needed. For diarrhea      . multivitamin (RENA-VIT) TABS tablet Take 1 tablet by mouth daily.  30 tablet  0  . omeprazole (PRILOSEC) 40 MG capsule Take 1 capsule (40 mg total) by mouth daily.  30 capsule  0  . oxyCODONE-acetaminophen (PERCOCET/ROXICET) 5-325 MG per tablet Take 1 tablet by mouth every 6 (six) hours as needed. For pain      . predniSONE (DELTASONE) 5 MG tablet Take 5 mg by mouth daily.      Marland Kitchen LIDOCAINE EX Apply 1 application topically See admin instructions. Apply to inject site on dialysis days (Tues, Thur, Sat) if needed for pain         REVIEW OF SYSTEMS: Cardiovascular: No chest pain Pulmonary: No productive cough Neurologic: No weakness, paresthesias, aphasia, or  amaurosis. No dizziness. Hematologic: No bleeding problems or clotting disorders. Musculoskeletal: No joint pain or joint swelling. Gastrointestinal: No blood in stool or hematemesis Genitourinary: No dysuria or hematuria. Psychiatric:: No history of major depression. Integumentary: No rashes or ulcers. Constitutional: No fever or chills.  PHYSICAL EXAMINATION:   Vital signs are BP 134/74  Pulse 90  Temp 97.5 F (36.4 C) (Oral)  Resp 16  Ht 6' (1.829 m)  Wt 101 lb (45.813 kg)  BMI 13.70 kg/m2  SpO2 100% General: The patient appears their stated age. HEENT:  No gross abnormalities Pulmonary:  Non labored breathing Musculoskeletal: There are no major deformities. Neurologic: No focal weakness or paresthesias are detected, Skin: There are no ulcer or rashes noted. Psychiatric: The patient has normal affect. Cardiovascular: There is a regular rate and rhythm without significant murmur appreciated.    Assessment: ESRD Plan: Shuntogram with possible intervention  V. Charlena Cross, M.D. [facility] [place] Office: [phone] Pager:  [phone]

## 2012-10-05 ENCOUNTER — Telehealth (INDEPENDENT_AMBULATORY_CARE_PROVIDER_SITE_OTHER): Payer: Self-pay

## 2012-10-05 NOTE — Telephone Encounter (Signed)
Beth with [facility] called to reports patient suture for drain has came out.  The drain site does not have any drainage, redness, swelling or warm to touch.  Beth would like to speak with you in further detail.  Please call Waynetta Sandy [facility] nurse @ [phone]

## 2012-10-06 ENCOUNTER — Telehealth (INDEPENDENT_AMBULATORY_CARE_PROVIDER_SITE_OTHER): Payer: Self-pay

## 2012-10-06 ENCOUNTER — Ambulatory Visit (INDEPENDENT_AMBULATORY_CARE_PROVIDER_SITE_OTHER): Payer: Medicare Other | Admitting: General Surgery

## 2012-10-06 ENCOUNTER — Encounter (INDEPENDENT_AMBULATORY_CARE_PROVIDER_SITE_OTHER): Payer: Self-pay | Admitting: General Surgery

## 2012-10-06 VITALS — BP 108/70 | HR 78 | Temp 97.4°F | Resp 16 | Ht 72.0 in | Wt 102.0 lb

## 2012-10-06 DIAGNOSIS — K632 Fistula of intestine: Secondary | ICD-10-CM

## 2012-10-06 NOTE — Patient Instructions (Signed)
We were able to suture your fistula drain today without any difficulty.  Keep her appointment with Dr. Luisa Hart on January 27.

## 2012-10-06 NOTE — Progress Notes (Addendum)
Patient ID: [name], male   DOB: [birth year], 52 y.o.   MRN: [id number] This patient is followed by Dr. 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 Dr. Mignon Pine has been contemplating another fistulogram. In the interim he has had his dialysis graft revised by Dr. Myra Gianotti and that has helped. He presents today because the suture on his fistula drain has pulled through the skin  Exam: Very thin, malnourished patient. BMI 13.76. In no distress. Cooperative. Fistula drain right lower quadrant just above the inguinal crease is in place.  Procedure note: Betadine prep. 1% Xylocaine with epinephrine. Drain resutured with 2-0 nylon. Tolerated well. Redressed.  Assessment: Partial dislodgment of enterocutaneous fistula drain ESRD on dialysis Hypertension History small bowel obstruction with expl. laparotomy X 2;  and small bowel resection x2 Enterocutaneous fistula, low output  Plan: Patient will keep appointment with Dr. Luisa Hart on January 27.Dr. Luisa Hart will decide regarding further investigation and management.   Angelia Mould. Derrell Lolling, M.D., [facility] General and Minimally invasive Surgery Breast and Colorectal Surgery Office:   [phone] Pager:   [phone]

## 2012-10-06 NOTE — Telephone Encounter (Signed)
LMOM that I was returning phone call and or Beth to call back.

## 2012-10-18 ENCOUNTER — Encounter (INDEPENDENT_AMBULATORY_CARE_PROVIDER_SITE_OTHER): Payer: Self-pay | Admitting: Surgery

## 2012-10-18 ENCOUNTER — Ambulatory Visit (INDEPENDENT_AMBULATORY_CARE_PROVIDER_SITE_OTHER): Payer: Medicare Other | Admitting: Surgery

## 2012-10-18 VITALS — BP 110/70 | HR 76 | Temp 97.7°F | Resp 14 | Ht 72.0 in | Wt 103.2 lb

## 2012-10-18 DIAGNOSIS — K632 Fistula of intestine: Secondary | ICD-10-CM

## 2012-10-18 NOTE — Patient Instructions (Signed)
Return in 4-6 weeks

## 2012-10-18 NOTE — Progress Notes (Signed)
Subjective:     Patient ID: [name], male   DOB: [birth year], 52 y.o.   MRN: [id number]  HPI Patient returns for followup of his enterocutaneous fistula. He still is a catheter in place her last study about a month ago showed a persistent fistula to small bowel. He had a significantly shortened small bowel at this point and questionable nutrition. He denies any abdominal pain. The drainage stopped draining for about 3 weeks then opened up last night after some constipation. He is otherwise comfortable. He has no fever or chills. He continues on dialysis.  Review of Systems  Cardiovascular: Negative.   Gastrointestinal: Negative.   Skin: Negative.        Objective:   Physical Exam  Constitutional: He appears well-developed and well-nourished.  HENT:  Head: Normocephalic and atraumatic.  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 He is not a good operative candidate due to his dialysis dependence as well as potential for short gut syndrome for any further small bowel resection which would make him dependent upon TNA. Continue with drainage option for now. If this output slowed for about 2 weeks but with a recent bout of constipation this may of flares back open. My hope is with time this will heal since he is not operative candidate for elective procedure. I discussed this with him and he understands. Return in 4-6 weeks. We'll need repeat fistulogram after next visit.

## 2012-10-27 ENCOUNTER — Encounter (HOSPITAL_BASED_OUTPATIENT_CLINIC_OR_DEPARTMENT_OTHER): Payer: Medicare Other | Attending: General Surgery

## 2012-10-27 DIAGNOSIS — K603 Anal fistula, unspecified: Secondary | ICD-10-CM | POA: Insufficient documentation

## 2012-10-27 DIAGNOSIS — K612 Anorectal abscess: Secondary | ICD-10-CM | POA: Insufficient documentation

## 2012-10-27 DIAGNOSIS — L98499 Non-pressure chronic ulcer of skin of other sites with unspecified severity: Secondary | ICD-10-CM | POA: Insufficient documentation

## 2012-10-27 DIAGNOSIS — I10 Essential (primary) hypertension: Secondary | ICD-10-CM | POA: Insufficient documentation

## 2012-10-27 DIAGNOSIS — Z9079 Acquired absence of other genital organ(s): Secondary | ICD-10-CM | POA: Insufficient documentation

## 2012-10-27 DIAGNOSIS — Z79899 Other long term (current) drug therapy: Secondary | ICD-10-CM | POA: Insufficient documentation

## 2012-10-27 DIAGNOSIS — Z940 Kidney transplant status: Secondary | ICD-10-CM | POA: Insufficient documentation

## 2012-10-27 NOTE — Progress Notes (Signed)
[facility]  NAME:  [name], HIRES NO.:  [id number]  MEDICAL RECORD NO.:  [id number]      DATE OF BIRTH:  [birth year]  PHYSICIAN:  Ardath Sax, M.D.           VISIT DATE:                                  OFFICE VISIT   HISTORY:  This is a 52 year old African American male, who has had great problems with his GI tract.  He has had multiple surgeries for bowel obstructions and has had a great deal of problems with his nutrition. He at one time weighed 175 pounds, he now weighs 100 pounds.  He has really been making great strides recently and that he was on tube feedings, but now is eating very well and is gaining weight and his is albumin is up to 3.7.  He enters here for our evaluation because he has an ulcer in his buttocks area.  I examined this and found it to be a fistula-in-ano with a perirectal abscess at 9 o'clock.  I think he needs to have a fistulotomy and I am going to send him back to his surgeon, who has done all of these surgical procedures on him.  He has had many things before including a kidney transplant, a prostatectomy, couple of small bowel obstructions, and he also had drainage of intra-abdominal abscess.  He is on several medicines also for hypertension.  He is on Mycelex and lisinopril, and I have sent him to his general surgeon, and he will come back here p.r.n.  DIAGNOSIS:  History of malnutrition and multiple bowel procedures and he is now on regular food and starting to gain weight.  He also has a history of hypertension.  He has a history of renal transplant and his present problem now is a perirectal abscess about 2 cm from the anal verge at 9 o'clock and the cause of this is fistula-in-ano.     Ardath Sax, M.D.     PP/MEDQ  D:  10/27/2012  T:  10/27/2012  Job:  [id number]

## 2012-10-28 ENCOUNTER — Telehealth (INDEPENDENT_AMBULATORY_CARE_PROVIDER_SITE_OTHER): Payer: Self-pay | Admitting: General Surgery

## 2012-10-28 ENCOUNTER — Ambulatory Visit (INDEPENDENT_AMBULATORY_CARE_PROVIDER_SITE_OTHER): Payer: Medicare Other | Admitting: Surgery

## 2012-10-28 ENCOUNTER — Encounter (INDEPENDENT_AMBULATORY_CARE_PROVIDER_SITE_OTHER): Payer: Self-pay | Admitting: Surgery

## 2012-10-28 VITALS — BP 110/72 | HR 92 | Temp 97.8°F | Resp 18 | Ht 72.0 in | Wt 99.6 lb

## 2012-10-28 DIAGNOSIS — K6289 Other specified diseases of anus and rectum: Secondary | ICD-10-CM | POA: Insufficient documentation

## 2012-10-28 NOTE — Telephone Encounter (Signed)
Beth, nurse with [facility], called to ask for once a week visit orders to monitor the pt's drain site.  Noted pt is scheduled for appt today to be seen for another wound, so will let Dr. Luisa Hart and his assistant know he is already being seen by home health nurse if new orders are needed to be implemented.

## 2012-10-28 NOTE — Patient Instructions (Signed)
Finnish antibiotics.  May soak anus in warm water.  Return 10 days.

## 2012-10-28 NOTE — Progress Notes (Signed)
Subjective:     Patient ID: [name], male   DOB: Feb 10, 1961, 52 y.o.   MRN: [id number]  HPI  Patient returns today for evaluation of a small lesion just left of his anus. This is felt to be either a perirectal abscess or fistula in ano. There is no pain today her drainage. Review of Systems  Respiratory: Negative.   Cardiovascular: Negative.   Gastrointestinal: Negative.        Objective:   Physical Exam  Neck: Normal range of motion. Neck supple.  Abdominal:    Genitourinary:     Neurological: He is alert.  Skin: Skin is warm and dry.  Psychiatric: He has a normal mood and affect. His behavior is normal. Judgment and thought content normal.       Assessment:     Rectal pain  Questionable fistula in ano versus abscess.  Chronic enterocutaneous fistula  Severe protein calorie malnutrition  End-stage renal disease on dialysis    Plan:     He is on Flagyl and will finish this. I see no signs of any active infection and his perineum and his rectal exam was normal. This could be an area of breakdown but there does not appear to be anything draining from the small wound just to left of his anus. His drainage catheter for a fistula is draining again after stopping for about 3 weeks. He we do her CT scan at this month. His abdominal examinations is nonfocal. Return to clinic in 10 days for recheck.

## 2012-10-29 NOTE — Progress Notes (Signed)
Please send note to our office as it may not be seen in [facility] for prolonged periods.

## 2012-11-01 ENCOUNTER — Telehealth (INDEPENDENT_AMBULATORY_CARE_PROVIDER_SITE_OTHER): Payer: Self-pay

## 2012-11-01 NOTE — Telephone Encounter (Signed)
Beth at [facility] called stating pt has small right buttock wound. She wants to know if Dr Luisa Hart wants any wd care orders for this or leave it to air dry. She is currently caring for abd wd. I advised her msg will be sent to Dr Luisa Hart his assistant to review.  She can be reached at [phone].

## 2012-11-02 ENCOUNTER — Telehealth (INDEPENDENT_AMBULATORY_CARE_PROVIDER_SITE_OTHER): Payer: Self-pay

## 2012-11-02 NOTE — Telephone Encounter (Signed)
Per Dr Luisa Hart, Beth at [facility] notified buttock wd air dry only.

## 2012-11-02 NOTE — Telephone Encounter (Signed)
Air dry

## 2012-11-08 ENCOUNTER — Ambulatory Visit (INDEPENDENT_AMBULATORY_CARE_PROVIDER_SITE_OTHER): Payer: Medicare Other | Admitting: Surgery

## 2012-11-08 ENCOUNTER — Encounter (INDEPENDENT_AMBULATORY_CARE_PROVIDER_SITE_OTHER): Payer: Self-pay | Admitting: Surgery

## 2012-11-08 VITALS — BP 112/88 | HR 84 | Temp 97.0°F | Resp 12 | Ht 72.0 in | Wt 106.2 lb

## 2012-11-08 DIAGNOSIS — K632 Fistula of intestine: Secondary | ICD-10-CM

## 2012-11-08 NOTE — Progress Notes (Signed)
Subjective:     Patient ID: [name], male   DOB: December 31, 1960, 52 y.o.   MRN: [id number]  HPI  Patient returns today for evaluation of a small lesion just left of his anus. This is felt to be either a perirectal abscess or fistula in ano. There is no pain today her drainage. Drain no longer draining.  No abdominal pain Review of Systems  Respiratory: Negative.   Cardiovascular: Negative.   Gastrointestinal: Negative.        Objective:   Physical Exam  Neck: Normal range of motion. Neck supple.  Abdominal:  Soft non tender drain some minimal dark material   Genitourinary:   Right buttock wound stable  Neurological: He is alert.  Skin: Skin is warm and dry.  Psychiatric: He has a normal mood and affect. His behavior is normal. Judgment and thought content normal.       Assessment:     Rectal pain  Questionable fistula in ano versus abscess.  Chronic enterocutaneous fistula  Severe protein calorie malnutrition  End-stage renal disease on dialysis    Plan:     No further rectal drainage.  Drain not draining.  Check CT for EC fistula.return 1 month

## 2012-11-08 NOTE — Patient Instructions (Signed)
WILL SCHEDULE FOR CT

## 2012-11-10 ENCOUNTER — Other Ambulatory Visit (INDEPENDENT_AMBULATORY_CARE_PROVIDER_SITE_OTHER): Payer: Self-pay | Admitting: Surgery

## 2012-11-10 ENCOUNTER — Ambulatory Visit
Admission: RE | Admit: 2012-11-10 | Discharge: 2012-11-10 | Disposition: A | Discharge status: Home / Self Care | Payer: Medicare Other | Source: Ambulatory Visit | Attending: Surgery | Admitting: Surgery

## 2012-11-10 ENCOUNTER — Telehealth (INDEPENDENT_AMBULATORY_CARE_PROVIDER_SITE_OTHER): Payer: Self-pay

## 2012-11-10 DIAGNOSIS — L0291 Cutaneous abscess, unspecified: Secondary | ICD-10-CM

## 2012-11-10 DIAGNOSIS — K632 Fistula of intestine: Secondary | ICD-10-CM

## 2012-11-10 NOTE — Telephone Encounter (Signed)
LMOM to call back regarding appt. I have set up appt at [facility] tomorrow 2/20 to have his drain exchanged out. His CT he had done today shows drain is moved into bowel loop and his keeping fistula from healing correctly according to Dr Bonnielee Haff. He needs to be at [facility] radiology 10:30 tomorrow.

## 2012-11-11 ENCOUNTER — Ambulatory Visit (HOSPITAL_COMMUNITY): Admission: RE | Admit: 2012-11-11 | Payer: Medicare Other | Source: Ambulatory Visit

## 2012-11-11 ENCOUNTER — Ambulatory Visit (HOSPITAL_COMMUNITY)
Admission: RE | Admit: 2012-11-11 | Discharge: 2012-11-11 | Disposition: A | Discharge status: Home / Self Care | Payer: Medicare Other | Source: Ambulatory Visit | Attending: Surgery | Admitting: Surgery

## 2012-11-11 DIAGNOSIS — Z4659 Encounter for fitting and adjustment of other gastrointestinal appliance and device: Secondary | ICD-10-CM | POA: Insufficient documentation

## 2012-11-11 DIAGNOSIS — L0291 Cutaneous abscess, unspecified: Secondary | ICD-10-CM

## 2012-11-11 MED ORDER — IOHEXOL 300 MG/ML  SOLN
50.0000 mL | Freq: Once | INTRAMUSCULAR | Status: AC | PRN
  Administered 2012-11-11: 15 mL

## 2012-11-11 NOTE — Procedures (Signed)
RLQ drain exchange and repositioning No comp

## 2012-11-29 ENCOUNTER — Telehealth (INDEPENDENT_AMBULATORY_CARE_PROVIDER_SITE_OTHER): Payer: Self-pay

## 2012-11-29 ENCOUNTER — Encounter (INDEPENDENT_AMBULATORY_CARE_PROVIDER_SITE_OTHER): Payer: Medicare Other | Admitting: Surgery

## 2012-11-29 ENCOUNTER — Ambulatory Visit (INDEPENDENT_AMBULATORY_CARE_PROVIDER_SITE_OTHER): Payer: Medicare Other | Admitting: Surgery

## 2012-11-29 ENCOUNTER — Encounter (INDEPENDENT_AMBULATORY_CARE_PROVIDER_SITE_OTHER): Payer: Self-pay | Admitting: Surgery

## 2012-11-29 VITALS — BP 126/74 | HR 64 | Temp 97.3°F | Resp 12 | Ht 72.0 in | Wt 106.0 lb

## 2012-11-29 DIAGNOSIS — K632 Fistula of intestine: Secondary | ICD-10-CM

## 2012-11-29 NOTE — Telephone Encounter (Signed)
The nurse called to report that the patient has a stitch that has come loose from his drain site.  He has an appointment next week 3/17 but thought maybe he shouldn't wait that long.

## 2012-11-29 NOTE — Progress Notes (Signed)
Subjective:     Patient ID: [name], male   DOB: 29-Mar-1961, 52 y.o.   MRN: [id number]  HPI Patient returns in followup of his enterocutaneous fistula.R./CT scan the catheter had migrated into the small intestine. There is radiology pulled his back and repositioned with a smaller catheter. He has done well with that. He stitch has come loose and he  comes in today to have the catheter resecured.  Review of Systems  Respiratory: Negative.   Gastrointestinal: Negative.        Objective:   Physical Exam  HENT:  Head: Normocephalic and atraumatic.  Eyes: EOM are normal. Pupils are equal, round, and reactive to light.  Abdominal:         Assessment:     EC FISTULA     Plan:     Return next week. The drainage is not much from the catheter hopefully this will close up. Without surgical intervention.

## 2012-11-29 NOTE — Patient Instructions (Signed)
Return next week.

## 2012-12-06 ENCOUNTER — Encounter (INDEPENDENT_AMBULATORY_CARE_PROVIDER_SITE_OTHER): Payer: Medicare Other | Admitting: Surgery

## 2012-12-06 ENCOUNTER — Ambulatory Visit (INDEPENDENT_AMBULATORY_CARE_PROVIDER_SITE_OTHER): Payer: Medicare Other | Admitting: Surgery

## 2012-12-06 ENCOUNTER — Encounter (INDEPENDENT_AMBULATORY_CARE_PROVIDER_SITE_OTHER): Payer: Self-pay | Admitting: Surgery

## 2012-12-06 VITALS — BP 128/62 | HR 72 | Temp 97.0°F | Resp 16 | Ht 72.0 in | Wt 106.0 lb

## 2012-12-06 DIAGNOSIS — K632 Fistula of intestine: Secondary | ICD-10-CM

## 2012-12-06 NOTE — Patient Instructions (Signed)
Return next month.

## 2012-12-06 NOTE — Progress Notes (Signed)
Subjective:     Patient ID: [name], male   DOB: April 15, 1961, 52 y.o.   MRN: [id number]  HPI Patient returns in followup of his enterocutaneous fistula.. He has done well with that.Minimal daily output ie  Less than 50 cc a day.  Some diarrhea.  Review of Systems  Respiratory: Negative.   Gastrointestinal: Negative.        Objective:   Physical Exam  HENT:  Head: Normocephalic and atraumatic.  Eyes: EOM are normal. Pupils are equal, round, and reactive to light.  Abdominal:    Anus :  Sacral decubiti healed  Small perianal open area clean without drainage     Assessment:     EC FISTULA     Plan:     Return next week. The drainage is not much from the catheter hopefully this will close up. Without surgical intervention.

## 2012-12-17 ENCOUNTER — Other Ambulatory Visit: Payer: Self-pay | Admitting: Nephrology

## 2012-12-17 DIAGNOSIS — I739 Peripheral vascular disease, unspecified: Secondary | ICD-10-CM

## 2012-12-20 ENCOUNTER — Telehealth (INDEPENDENT_AMBULATORY_CARE_PROVIDER_SITE_OTHER): Payer: Self-pay | Admitting: *Deleted

## 2012-12-20 NOTE — Telephone Encounter (Signed)
Spoke to [place] with [facility] to let her know it is ok to discharge patient from home health.  She stated she will see patient next week for his last visit then proceed with discharge.  Setup patient to come in tomorrow 12/21/12 at 245p to have stitch replaced.  Patient aware of appt and agreeable at this time.

## 2012-12-20 NOTE — Telephone Encounter (Signed)
Beth with [facility] called to ask to make sure it is ok to continue with the discharge from home health next week.  Also she stated the stitch has come loose from patients drain.

## 2012-12-20 NOTE — Telephone Encounter (Signed)
Ok to discharge.  Needs a stich so he can come in during my urg on tues

## 2012-12-21 ENCOUNTER — Encounter (INDEPENDENT_AMBULATORY_CARE_PROVIDER_SITE_OTHER): Payer: Self-pay | Admitting: Surgery

## 2012-12-21 ENCOUNTER — Ambulatory Visit (INDEPENDENT_AMBULATORY_CARE_PROVIDER_SITE_OTHER): Payer: Medicare Other | Admitting: Surgery

## 2012-12-21 VITALS — BP 102/64 | HR 78 | Temp 97.4°F | Resp 18 | Ht 72.0 in | Wt 102.8 lb

## 2012-12-21 DIAGNOSIS — K632 Fistula of intestine: Secondary | ICD-10-CM

## 2012-12-21 NOTE — Patient Instructions (Signed)
Keep follow-up appointment

## 2012-12-21 NOTE — Progress Notes (Signed)
Pt returns to have drained re secured.  3 o nylon used,  Site cleaned and 1 % lidocaine used.  Pt tolerated  Procedure well.

## 2012-12-22 ENCOUNTER — Ambulatory Visit
Admission: RE | Admit: 2012-12-22 | Discharge: 2012-12-22 | Disposition: A | Discharge status: Home / Self Care | Payer: Medicare Other | Source: Ambulatory Visit | Attending: Nephrology | Admitting: Nephrology

## 2012-12-22 DIAGNOSIS — I739 Peripheral vascular disease, unspecified: Secondary | ICD-10-CM

## 2012-12-31 ENCOUNTER — Encounter (INDEPENDENT_AMBULATORY_CARE_PROVIDER_SITE_OTHER): Payer: Self-pay | Admitting: Surgery

## 2012-12-31 ENCOUNTER — Ambulatory Visit (INDEPENDENT_AMBULATORY_CARE_PROVIDER_SITE_OTHER): Payer: Medicare Other | Admitting: Surgery

## 2012-12-31 VITALS — BP 120/78 | HR 84 | Temp 98.5°F | Resp 18 | Wt 104.0 lb

## 2012-12-31 DIAGNOSIS — K632 Fistula of intestine: Secondary | ICD-10-CM

## 2012-12-31 NOTE — Patient Instructions (Signed)
Return 3 weeks/

## 2012-12-31 NOTE — Progress Notes (Signed)
Subjective:     Patient ID: [name], male   DOB: [birth year], 52 y.o.   MRN: [id number]  HPI Patient returns in followup of his enterocutaneous fistula.. He has done well with that.Minimal daily output ie  Less than 50 cc a day.  Some diarrhea.  Review of Systems  Respiratory: Negative.   Gastrointestinal: Negative.        Objective:   Physical Exam  HENT:  Head: Normocephalic and atraumatic.  Eyes: EOM are normal. Pupils are equal, round, and reactive to light.  Abdominal:  Soft nontender. Right lower quadrant catheter in place draining less than 25 cc a day the enteric contents  Anus :  Sacral decubiti healed  Small perianal open area clean without drainage     Assessment:     EC FISTULA     Plan:     Return 3  week. The drainage is not much from the catheter hopefully this will close up. Without surgical intervention. I discussed removing the catheter in 3 weeks to see how he does. I discussed the possibility if surgery that does not close up but he is doing well with the catheter and he at high risk of short gut syndrome with it he further surgical intervention or bowel resection. He is gaining weight this point in time and doing well.

## 2013-01-31 ENCOUNTER — Encounter (INDEPENDENT_AMBULATORY_CARE_PROVIDER_SITE_OTHER): Payer: Self-pay | Admitting: Surgery

## 2013-01-31 ENCOUNTER — Ambulatory Visit (INDEPENDENT_AMBULATORY_CARE_PROVIDER_SITE_OTHER): Payer: Medicare Other | Admitting: Surgery

## 2013-01-31 VITALS — BP 120/70 | HR 72 | Temp 98.4°F | Resp 12 | Ht 72.0 in | Wt 108.4 lb

## 2013-01-31 DIAGNOSIS — K632 Fistula of intestine: Secondary | ICD-10-CM

## 2013-01-31 NOTE — Patient Instructions (Signed)
Return 2 weeks.

## 2013-01-31 NOTE — Progress Notes (Signed)
Subjective:     Patient ID: Keymarion Bearman, male   DOB: 01-27-1961, 52 y.o.   MRN: [id number]  HPI Patient returns in followup of his enterocutaneous fistula.. He has done well with that.Minimal daily output ie  Less than 50 cc a day.  Some diarrhea.  Review of Systems  Respiratory: Negative.   Gastrointestinal: Negative.        Objective:   Physical Exam  HENT:  Head: Normocephalic and atraumatic.  Eyes: EOM are normal. Pupils are equal, round, and reactive to light.  Abdominal:  Soft nontender. Right lower quadrant catheter in place draining less than 25 cc a day the enteric contents  Anus :  Sacral decubiti healed  Small perianal open area clean without drainage     Assessment:     EC FISTULA     Plan:     Return 2  week. The drainage is not much from the catheter hopefully this will close up. Without surgical intervention. I discussed removing the catheter in 2 weeks to see how he does. I discussed the possibility if surgery that does not close up but he is doing well with the catheter and he at high risk of short gut syndrome with it he further surgical intervention or bowel resection. He is gaining weight this point in time and doing well.

## 2013-02-18 ENCOUNTER — Encounter (INDEPENDENT_AMBULATORY_CARE_PROVIDER_SITE_OTHER): Payer: Self-pay | Admitting: Surgery

## 2013-02-18 ENCOUNTER — Ambulatory Visit (INDEPENDENT_AMBULATORY_CARE_PROVIDER_SITE_OTHER): Payer: Medicare Other | Admitting: Surgery

## 2013-02-18 VITALS — BP 130/78 | HR 74 | Temp 97.8°F | Resp 12 | Ht 72.0 in | Wt 105.8 lb

## 2013-02-18 DIAGNOSIS — K632 Fistula of intestine: Secondary | ICD-10-CM

## 2013-02-18 NOTE — Patient Instructions (Signed)
Return 3 weeks.  Keep area clean.

## 2013-02-18 NOTE — Progress Notes (Signed)
Subjective:     Patient ID: [name], male   DOB: [birth year], 52 y.o.   MRN: [id number]  HPI Patient returns in followup of his enterocutaneous fistula.. He has done well with that.Minimal daily output ie  Less than 50 cc a day.  Some diarrhea.  Review of Systems  Respiratory: Negative.   Gastrointestinal: Negative.        Objective:   Physical Exam  HENT:  Head: Normocephalic and atraumatic.  Eyes: EOM are normal. Pupils are equal, round, and reactive to light.  Abdominal:  Soft nontender. Right lower quadrant catheter in place draining less than 100  cc a day drain removed  Anus :  Sacral decubiti healed  Small perianal open area clean without drainage     Assessment:     EC FISTULA  Perianal wound stable    Plan:     Return 2- 3  week. The drainage is not much from the catheter hopefully this will close up. Without surgical intervention.I discussed the possibility if surgery that does not close up but he is doing well with the catheter and he at high risk of short gut syndrome with it he further surgical intervention or bowel resection. He is gaining weight this point in time and doing well. 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  If he worsens,  He knows to call.

## 2013-02-20 HISTORY — PX: PELVIC ABCESS DRAINAGE: SHX2189

## 2013-03-07 ENCOUNTER — Ambulatory Visit (INDEPENDENT_AMBULATORY_CARE_PROVIDER_SITE_OTHER): Payer: Medicare Other | Admitting: Surgery

## 2013-03-07 ENCOUNTER — Encounter (INDEPENDENT_AMBULATORY_CARE_PROVIDER_SITE_OTHER): Payer: Self-pay | Admitting: Surgery

## 2013-03-07 VITALS — BP 100/60 | HR 78 | Temp 97.8°F | Resp 16 | Ht 71.0 in | Wt 108.2 lb

## 2013-03-07 DIAGNOSIS — K6289 Other specified diseases of anus and rectum: Secondary | ICD-10-CM

## 2013-03-07 DIAGNOSIS — K629 Disease of anus and rectum, unspecified: Secondary | ICD-10-CM | POA: Insufficient documentation

## 2013-03-07 NOTE — Progress Notes (Signed)
Patient ID: [name], male   DOB: April 29, 1961, 52 y.o.   MRN: [id number]  Chief Complaint  Patient presents with  . Follow-up    3 wk reck enter fistula    HPI [name] is a 52 y.o. male.  Patient returns for followup of his enterocutaneous fistula. His percutaneous drain was removed 3 weeks ago and he is doing well. No significant abdominal pain nausea or vomiting. The tract site is healed. HPI  Past Medical History  Diagnosis Date  . ESRD needing dialysis   . Prostate cancer   . Hypertension   . Renal transplant disorder 1997    Now HD   . Gout tophi   . Chronic steroid use     For Gout transplant. Has sever gout. Did not tolerate Allopurinol. Do not taper per PCP   . Systolic CHF, chronic     2 D echo 04/2012 with EF of 45 %   . Malnutrition   . 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 F/U with Dr Dwain Sarna. Started on TNA  . Anemia associated with chronic renal failure   . Hepatitis C     Past Surgical History  Procedure Laterality Date  . Laparotomy  04/14/2012    Procedure: EXPLORATORY LAPAROTOMY;  Surgeon: Clovis Pu. Priest Lockridge, MD;  Location: [facility] OR;  Service: General;  Laterality: N/A;  . Colon resection  04/14/2012  . Laparotomy  04/22/2012    Procedure: EXPLORATORY LAPAROTOMY;  Surgeon: Ardeth Sportsman, MD;  Location: [facility] OR;  Service: General;  Laterality: N/A;  lysis of adhesions, omentoplasty, repair small bowel  . Thrombectomy w/ embolectomy  04/27/2012    Procedure: THROMBECTOMY ARTERIOVENOUS GORE-TEX GRAFT;  Surgeon: Chuck Hint, MD;  Location: [facility] OR;  Service: Vascular;  Laterality: Left;  Thrombectomy of left thigh arteriovenous gortex graft  . Prostectomy  2011    Family History  Problem Relation Age of Onset  . Hypertension Father   . Pneumonia Brother   . Lung disease Brother     Social History History  Substance Use Topics  . Smoking status:  Former Games developer  . Smokeless tobacco: Not on file     Comment: Quit 30 years ago  . Alcohol Use: No    Allergies  Allergen Reactions  . Allopurinol Other (See Comments)    REACTION: decreased platelets  . Aspirin Other (See Comments)    REACTION: unknown reaction    Current Outpatient Prescriptions  Medication Sig Dispense Refill  . calcium acetate (PHOSLO) 667 MG tablet       . calcium carbonate (TUMS EX) 750 MG chewable tablet Chew 1-2 tablets by mouth daily as needed. For heartburn      . clotrimazole (MYCELEX) 10 MG troche Take 10 mg by mouth 5 (five) times daily.      Marland Kitchen COLCRYS 0.6 MG tablet       . ECK CLOTRIMAZOLE EX       . LIDOCAINE EX Apply 1 application topically See admin instructions. Apply to inject site on dialysis days (Tues, Thur, Sat) if needed for pain      . loperamide (IMODIUM A-D) 2 MG tablet Take 2 mg by mouth daily as needed. For diarrhea      . multivitamin (RENA-VIT) TABS tablet Take 1 tablet by mouth daily.  30 tablet  0  . omeprazole (PRILOSEC) 40 MG capsule       .  oxyCODONE-acetaminophen (PERCOCET/ROXICET) 5-325 MG per tablet Take 1 tablet by mouth every 6 (six) hours as needed. For pain      . predniSONE (DELTASONE) 5 MG tablet Take 5 mg by mouth daily.       No current facility-administered medications for this visit.   Facility-Administered Medications Ordered in Other Visits  Medication Dose Route Frequency Provider Last Rate Last Dose  . fentaNYL (SUBLIMAZE) injection   Intravenous PRN Abundio Miu, MD   25 mcg at 05/05/12 1544  . midazolam (VERSED) 5 MG/5ML injection   Intravenous PRN Abundio Miu, MD   2 mg at 05/05/12 1544    Review of Systems Review of Systems  Gastrointestinal: Positive for anal bleeding. Negative for abdominal pain and abdominal distention.    Blood pressure 100/60, pulse 78, temperature 97.8 F (36.6 C), temperature source Temporal, resp. rate 16, height 5\' 11"  (1.803 m), weight 108 lb 3.2 oz (49.079  kg).  Physical Exam Physical Exam  Constitutional: He is oriented to person, place, and time.  Abdominal: Soft. Bowel sounds are normal. There is no tenderness.    Genitourinary:     Musculoskeletal:  A/v access noted  Neurological: He is alert and oriented to person, place, and time.  Skin: Skin is warm and dry.  Psychiatric: He has a normal mood and affect. His behavior is normal. Judgment and thought content normal.    Data Reviewed none  Assessment    Persistent anal skin lesion measuring 1 x 1 cm with central ulceration    Plan    Excision of persistent anal skin lesion as outpatient. He has done well since his percutaneous drain has been removed with no evidence of recurrent enterocutaneous fistula or abscess. This area has persisted despite maximal medical management and observation and I feel excision will be necessary to get it to heal and to exclude malignancy. Risk of bleeding, infection, and the need for further surgery discussed. He would like to proceed.The procedure has been discussed with the patient.  Alternative therapies have been discussed with the patient.  Operative risks include bleeding,  Infection,  Organ injury,  Nerve injury,  Blood vessel injury,  DVT,  Pulmonary embolism,  Death,  And possible reoperation.  Medical management risks include worsening of present situation.  The success of the procedure is 50 -90 % at treating patients symptoms.  The patient understands and agrees to proceed.       [name] A. 03/07/2013, 11:58 AM

## 2013-03-21 ENCOUNTER — Encounter (HOSPITAL_COMMUNITY): Payer: Self-pay | Admitting: Pharmacy Technician

## 2013-03-21 ENCOUNTER — Encounter (INDEPENDENT_AMBULATORY_CARE_PROVIDER_SITE_OTHER): Payer: Self-pay | Admitting: Surgery

## 2013-03-21 ENCOUNTER — Ambulatory Visit (INDEPENDENT_AMBULATORY_CARE_PROVIDER_SITE_OTHER): Payer: Medicare Other | Admitting: Surgery

## 2013-03-21 VITALS — BP 142/66 | HR 68 | Temp 98.3°F | Resp 12 | Ht 71.0 in | Wt 109.4 lb

## 2013-03-21 DIAGNOSIS — L03319 Cellulitis of trunk, unspecified: Secondary | ICD-10-CM

## 2013-03-21 DIAGNOSIS — L02219 Cutaneous abscess of trunk, unspecified: Secondary | ICD-10-CM

## 2013-03-21 MED ORDER — CLINDAMYCIN HCL 150 MG PO CAPS: 300.0000 mg | ORAL_CAPSULE | Freq: Four times a day (QID) | ORAL | Status: DC

## 2013-03-21 NOTE — Patient Instructions (Signed)
Keep area covered and monitor drainage.  Call if increasing abdominal pain,  Increasing drainage  Or vomiting.  Take antibiotics until gone.

## 2013-03-21 NOTE — Progress Notes (Signed)
Subjective:     Patient ID: [name], male   DOB: 1960-12-10, 52 y.o.   MRN: [id number]  HPI Patient returns in followup. He had introduced cutaneous fistula managed by percutaneous drain since last summer. Drain removed about a month ago and he has done well. He is scheduled for outpatient surgery for removed a small lesion on his anus next week. His drain has been out for 3 and half weeks. He had a small amount of drainage and swelling noted over the weekend at the drain exit site in his right groin. No fever or chills. No significant abdominal pain. Bowel bladder function normal.  Review of Systems  Constitutional: Negative for fever and fatigue.  Respiratory: Negative.   Cardiovascular: Negative.   Gastrointestinal: Negative.        Objective:   Physical Exam  HENT:  Head: Normocephalic and atraumatic.  Abdominal:    Skin: Skin is warm and dry.  Psychiatric: He has a normal mood and affect. His behavior is normal. Judgment and thought content normal.       Assessment:     Small subcutaneous abscess at previous percutaneous drainage site. No evidence of this being a fistula.    Plan:     Placed on Cleocin 300 mg by mouth 3 times a day.this requires no renal dose adjustment. Continue surgery for next week as long as area does not worsen. Patient told to call with increasing abdominal pain, fever, chills, nausea or vomiting. Cover with dry dressing and note amount and type of drainage. Call this increases.

## 2013-03-23 ENCOUNTER — Encounter (HOSPITAL_COMMUNITY): Payer: Self-pay

## 2013-03-23 ENCOUNTER — Encounter (HOSPITAL_COMMUNITY)
Admission: RE | Admit: 2013-03-23 | Discharge: 2013-03-23 | Disposition: A | Discharge status: Home / Self Care | Payer: Medicare Other | Source: Ambulatory Visit | Attending: Surgery | Admitting: Surgery

## 2013-03-23 HISTORY — DX: Personal history of other diseases of the digestive system: Z87.19

## 2013-03-23 HISTORY — DX: Unspecified osteoarthritis, unspecified site: M19.90

## 2013-03-23 HISTORY — DX: Gastro-esophageal reflux disease without esophagitis: K21.9

## 2013-03-23 LAB — CBC WITH DIFFERENTIAL/PLATELET
Basophils Absolute: 0 10*3/uL (ref 0.0–0.1)
Eosinophils Absolute: 0.1 10*3/uL (ref 0.0–0.7)
Eosinophils Relative: 1 % (ref 0–5)
MCH: 29.6 pg (ref 26.0–34.0)
MCHC: 33.4 g/dL (ref 30.0–36.0)
MCV: 88.5 fL (ref 78.0–100.0)
Platelets: 102 10*3/uL — ABNORMAL LOW (ref 150–400)
RDW: 13.8 % (ref 11.5–15.5)

## 2013-03-23 LAB — COMPREHENSIVE METABOLIC PANEL
ALT: 30 U/L (ref 0–53)
AST: 34 U/L (ref 0–37)
Calcium: 10.9 mg/dL — ABNORMAL HIGH (ref 8.4–10.5)
GFR calc Af Amer: 7 mL/min — ABNORMAL LOW (ref >90)
Glucose, Bld: 92 mg/dL (ref 70–99)
Sodium: 141 mEq/L (ref 135–145)
Total Protein: 7.3 g/dL (ref 6.0–8.3)

## 2013-03-23 NOTE — Pre-Procedure Instructions (Addendum)
[name]  03/23/2013   Your procedure is scheduled on: 7.9.14  Report to [facility] at 630 AM.  Call this number if you have problems the morning of surgery: [phone]   Remember:   Do not eat food or drink liquids after midnight.   Take these medicines the morning of surgery with A SIP OF WATER: clindamycin,prilosec,prednisone,pain med,   Do not wear jewelry, make-up or nail polish.  Do not wear lotions, powders, or perfumes. You may wear deodorant.  Do not shave 48 hours prior to surgery. Men may shave face and neck.  Do not bring valuables to the hospital.  [facility] is not responsible                   for any belongings or valuables.  Contacts, dentures or bridgework may not be worn into surgery.  Leave suitcase in the car. After surgery it may be brought to your room.  For patients admitted to the hospital, checkout time is 11:00 AM the day of  discharge.   Patients discharged the day of surgery will not be allowed to drive  home.  Name and phone number of your driver:   Special Instructions: Shower using CHG 2 nights before surgery and the night before surgery.  If you shower the day of surgery use CHG.  Use special wash - you have one bottle of CHG for all showers.  You should use approximately 1/3 of the bottle for each shower.   Please read over the following fact sheets that you were given: Pain Booklet, Coughing and Deep Breathing and Surgical Site Infection Prevention

## 2013-03-24 NOTE — Progress Notes (Signed)
Anesthesia chart review: Patient is a 30 old male scheduled for excision of anal skin lesion on 03/30/2013 by Dr. Luisa Hart. History includes end-stage renal disease on hemodialysis, former smoker, prostate cancer status post prostatectomy, hypertension, malnutrition, hepatitis C, GERD, hiatal hernia, arthritis, anemia of chronic disease, malnutrition, severe gout on chronic steroids, chronic systolic CHF, small bowel obstruction status post colon resection 2013 complicated by enterocutaneous fistula.  EKG on 07/01/12 showed ST, non-specific inferior T wave abnormality.  Echo on 04/26/12 showed: - Left ventricle: Systolic function was mildly to moderately reduced. The estimated ejection fraction was in the range of 40% to 45%. Regional wall motion abnormalities cannot be excluded. Left ventricular diastolic function parameters were normal. - Mitral valve: Mild regurgitation. - Pericardium, extracardiac: There was a right pleural effusion.  CXR on 07/03/12 showed no active disease.  Preoperative labs noted.  He has thrombocytopenia with a PLT count of 102K, previously 94-120K since 06/2012.  He has known Hep C which would be contributing.  AST/ALT were WNL.  He will get an ISTAT on arrival.  Patient has undergone multiple surgeries in the past year.  His PLT count appears to be within the lower end of his baseline.  If ISTAT results are reasonable then I would anticipate that he could proceed as planned.  Velna Ochs [facility]/Anesthesiology Phone [phone] 03/24/2013 4:00 PM

## 2013-03-29 MED ORDER — CEFAZOLIN SODIUM-DEXTROSE 2-3 GM-% IV SOLR
2.0000 g | INTRAVENOUS | Status: AC
  Administered 2013-03-30: 2 g via INTRAVENOUS
  Filled 2013-03-29: qty 50

## 2013-03-30 ENCOUNTER — Encounter (HOSPITAL_COMMUNITY): Payer: Self-pay | Admitting: Vascular Surgery

## 2013-03-30 ENCOUNTER — Encounter (HOSPITAL_COMMUNITY): Payer: Self-pay | Admitting: *Deleted

## 2013-03-30 ENCOUNTER — Ambulatory Visit (HOSPITAL_COMMUNITY): Payer: Medicare Other | Admitting: Anesthesiology

## 2013-03-30 ENCOUNTER — Ambulatory Visit (HOSPITAL_COMMUNITY)
Admission: RE | Admit: 2013-03-30 | Discharge: 2013-03-30 | Disposition: A | Discharge status: Home / Self Care | Payer: Medicare Other | Source: Ambulatory Visit | Attending: Surgery | Admitting: Surgery

## 2013-03-30 ENCOUNTER — Encounter (HOSPITAL_COMMUNITY)
Admission: RE | Disposition: A | Discharge status: Home / Self Care | Payer: Self-pay | Source: Ambulatory Visit | Attending: Surgery

## 2013-03-30 DIAGNOSIS — Z992 Dependence on renal dialysis: Secondary | ICD-10-CM | POA: Insufficient documentation

## 2013-03-30 DIAGNOSIS — D649 Anemia, unspecified: Secondary | ICD-10-CM

## 2013-03-30 DIAGNOSIS — Z886 Allergy status to analgesic agent status: Secondary | ICD-10-CM | POA: Insufficient documentation

## 2013-03-30 DIAGNOSIS — B1920 Unspecified viral hepatitis C without hepatic coma: Secondary | ICD-10-CM | POA: Insufficient documentation

## 2013-03-30 DIAGNOSIS — K651 Peritoneal abscess: Secondary | ICD-10-CM

## 2013-03-30 DIAGNOSIS — IMO0002 Reserved for concepts with insufficient information to code with codable children: Secondary | ICD-10-CM | POA: Insufficient documentation

## 2013-03-30 DIAGNOSIS — I509 Heart failure, unspecified: Secondary | ICD-10-CM | POA: Insufficient documentation

## 2013-03-30 DIAGNOSIS — K219 Gastro-esophageal reflux disease without esophagitis: Secondary | ICD-10-CM | POA: Insufficient documentation

## 2013-03-30 DIAGNOSIS — N039 Chronic nephritic syndrome with unspecified morphologic changes: Secondary | ICD-10-CM | POA: Insufficient documentation

## 2013-03-30 DIAGNOSIS — E46 Unspecified protein-calorie malnutrition: Secondary | ICD-10-CM

## 2013-03-30 DIAGNOSIS — Z87891 Personal history of nicotine dependence: Secondary | ICD-10-CM | POA: Insufficient documentation

## 2013-03-30 DIAGNOSIS — J9601 Acute respiratory failure with hypoxia: Secondary | ICD-10-CM

## 2013-03-30 DIAGNOSIS — Z888 Allergy status to other drugs, medicaments and biological substances status: Secondary | ICD-10-CM | POA: Insufficient documentation

## 2013-03-30 DIAGNOSIS — K56609 Unspecified intestinal obstruction, unspecified as to partial versus complete obstruction: Secondary | ICD-10-CM

## 2013-03-30 DIAGNOSIS — A630 Anogenital (venereal) warts: Secondary | ICD-10-CM | POA: Insufficient documentation

## 2013-03-30 DIAGNOSIS — R58 Hemorrhage, not elsewhere classified: Secondary | ICD-10-CM

## 2013-03-30 DIAGNOSIS — K629 Disease of anus and rectum, unspecified: Secondary | ICD-10-CM

## 2013-03-30 DIAGNOSIS — Z940 Kidney transplant status: Secondary | ICD-10-CM | POA: Insufficient documentation

## 2013-03-30 DIAGNOSIS — I5022 Chronic systolic (congestive) heart failure: Secondary | ICD-10-CM | POA: Insufficient documentation

## 2013-03-30 DIAGNOSIS — M109 Gout, unspecified: Secondary | ICD-10-CM | POA: Insufficient documentation

## 2013-03-30 DIAGNOSIS — K449 Diaphragmatic hernia without obstruction or gangrene: Secondary | ICD-10-CM | POA: Insufficient documentation

## 2013-03-30 DIAGNOSIS — R7881 Bacteremia: Secondary | ICD-10-CM

## 2013-03-30 DIAGNOSIS — D013 Carcinoma in situ of anus and anal canal: Secondary | ICD-10-CM | POA: Insufficient documentation

## 2013-03-30 DIAGNOSIS — K632 Fistula of intestine: Secondary | ICD-10-CM

## 2013-03-30 DIAGNOSIS — I739 Peripheral vascular disease, unspecified: Secondary | ICD-10-CM | POA: Insufficient documentation

## 2013-03-30 DIAGNOSIS — I059 Rheumatic mitral valve disease, unspecified: Secondary | ICD-10-CM | POA: Insufficient documentation

## 2013-03-30 DIAGNOSIS — R509 Fever, unspecified: Secondary | ICD-10-CM

## 2013-03-30 DIAGNOSIS — D631 Anemia in chronic kidney disease: Secondary | ICD-10-CM | POA: Insufficient documentation

## 2013-03-30 DIAGNOSIS — D696 Thrombocytopenia, unspecified: Secondary | ICD-10-CM

## 2013-03-30 DIAGNOSIS — Z79899 Other long term (current) drug therapy: Secondary | ICD-10-CM | POA: Insufficient documentation

## 2013-03-30 DIAGNOSIS — I10 Essential (primary) hypertension: Secondary | ICD-10-CM

## 2013-03-30 DIAGNOSIS — K6289 Other specified diseases of anus and rectum: Secondary | ICD-10-CM

## 2013-03-30 DIAGNOSIS — N186 End stage renal disease: Secondary | ICD-10-CM | POA: Insufficient documentation

## 2013-03-30 DIAGNOSIS — T8131XD Disruption of external operation (surgical) wound, not elsewhere classified, subsequent encounter: Secondary | ICD-10-CM

## 2013-03-30 DIAGNOSIS — I120 Hypertensive chronic kidney disease with stage 5 chronic kidney disease or end stage renal disease: Secondary | ICD-10-CM | POA: Insufficient documentation

## 2013-03-30 DIAGNOSIS — R000 Tachycardia, unspecified: Secondary | ICD-10-CM

## 2013-03-30 DIAGNOSIS — T82898D Other specified complication of vascular prosthetic devices, implants and grafts, subsequent encounter: Secondary | ICD-10-CM

## 2013-03-30 HISTORY — PX: TRANSANAL EXCISION OF RECTAL MASS: SHX6134

## 2013-03-30 LAB — POCT I-STAT 4, (NA,K, GLUC, HGB,HCT)
Glucose, Bld: 79 mg/dL (ref 70–99)
HCT: 47 % (ref 39.0–52.0)

## 2013-03-30 LAB — GLUCOSE, CAPILLARY: Glucose-Capillary: 83 mg/dL (ref 70–99)

## 2013-03-30 SURGERY — EXCISION, MASS, RECTUM, ANAL APPROACH
Anesthesia: General | Site: Anus | Wound class: Contaminated

## 2013-03-30 MED ORDER — BUPIVACAINE-EPINEPHRINE 0.25% -1:200000 IJ SOLN
INTRAMUSCULAR | Status: DC | PRN
  Administered 2013-03-30: 30 mL

## 2013-03-30 MED ORDER — ARTIFICIAL TEARS OP OINT
TOPICAL_OINTMENT | OPHTHALMIC | Status: DC | PRN
  Administered 2013-03-30: 1 via OPHTHALMIC

## 2013-03-30 MED ORDER — BACITRACIN-NEOMYCIN-POLYMYXIN 400-5-5000 EX OINT
TOPICAL_OINTMENT | CUTANEOUS | Status: AC
  Filled 2013-03-30: qty 1

## 2013-03-30 MED ORDER — 0.9 % SODIUM CHLORIDE (POUR BTL) OPTIME
TOPICAL | Status: DC | PRN
  Administered 2013-03-30: 1000 mL

## 2013-03-30 MED ORDER — LACTATED RINGERS IV SOLN: INTRAVENOUS | Status: DC | PRN

## 2013-03-30 MED ORDER — LIDOCAINE HCL 2 % EX GEL
CUTANEOUS | Status: AC
  Filled 2013-03-30: qty 20

## 2013-03-30 MED ORDER — OXYCODONE-ACETAMINOPHEN 5-325 MG PO TABS: 1.0000 | ORAL_TABLET | ORAL | Status: DC | PRN

## 2013-03-30 MED ORDER — BACITRACIN-NEOMYCIN-POLYMYXIN OINTMENT TUBE
TOPICAL_OINTMENT | CUTANEOUS | Status: DC | PRN
  Administered 2013-03-30: 1 via TOPICAL

## 2013-03-30 MED ORDER — POVIDONE-IODINE 10 % EX SOLN
CUTANEOUS | Status: DC | PRN
  Administered 2013-03-30: 1 via TOPICAL

## 2013-03-30 MED ORDER — LIDOCAINE HCL (CARDIAC) 20 MG/ML IV SOLN
INTRAVENOUS | Status: DC | PRN
  Administered 2013-03-30: 60 mg via INTRAVENOUS

## 2013-03-30 MED ORDER — CHLORHEXIDINE GLUCONATE 4 % EX LIQD: 1.0000 "application " | Freq: Once | CUTANEOUS | Status: DC

## 2013-03-30 MED ORDER — FENTANYL CITRATE 0.05 MG/ML IJ SOLN
INTRAMUSCULAR | Status: DC | PRN
  Administered 2013-03-30 (×2): 50 ug via INTRAVENOUS

## 2013-03-30 MED ORDER — PROPOFOL 10 MG/ML IV BOLUS
INTRAVENOUS | Status: DC | PRN
  Administered 2013-03-30: 150 mg via INTRAVENOUS

## 2013-03-30 MED ORDER — OXYCODONE HCL 5 MG PO TABS: 5.0000 mg | ORAL_TABLET | Freq: Once | ORAL | Status: DC | PRN

## 2013-03-30 MED ORDER — ONDANSETRON HCL 4 MG/2ML IJ SOLN
INTRAMUSCULAR | Status: DC | PRN
  Administered 2013-03-30: 4 mg via INTRAVENOUS

## 2013-03-30 MED ORDER — FENTANYL CITRATE 0.05 MG/ML IJ SOLN: 25.0000 ug | INTRAMUSCULAR | Status: DC | PRN

## 2013-03-30 MED ORDER — SODIUM CHLORIDE 0.9 % IV SOLN
INTRAVENOUS | Status: DC | PRN
  Administered 2013-03-30: 08:00:00 via INTRAVENOUS

## 2013-03-30 MED ORDER — DIBUCAINE 1 % RE OINT
TOPICAL_OINTMENT | RECTAL | Status: AC
  Filled 2013-03-30: qty 28

## 2013-03-30 MED ORDER — OXYCODONE HCL 5 MG/5ML PO SOLN: 5.0000 mg | Freq: Once | ORAL | Status: DC | PRN

## 2013-03-30 MED ORDER — BUPIVACAINE-EPINEPHRINE PF 0.25-1:200000 % IJ SOLN
INTRAMUSCULAR | Status: AC
  Filled 2013-03-30: qty 30

## 2013-03-30 MED ORDER — METOCLOPRAMIDE HCL 5 MG/ML IJ SOLN: 10.0000 mg | Freq: Once | INTRAMUSCULAR | Status: DC | PRN

## 2013-03-30 MED ORDER — SURGILUBE EX GEL
CUTANEOUS | Status: DC | PRN
  Administered 2013-03-30: 1 via TOPICAL

## 2013-03-30 SURGICAL SUPPLY — 41 items
BLADE SURG 10 STRL SS (BLADE) ×2 IMPLANT
BLADE SURG 15 STRL LF DISP TIS (BLADE) ×1 IMPLANT
BLADE SURG 15 STRL SS (BLADE) ×2
CANISTER SUCTION 2500CC (MISCELLANEOUS) ×2 IMPLANT
CLOTH BEACON ORANGE TIMEOUT ST (SAFETY) ×2 IMPLANT
COVER SURGICAL LIGHT HANDLE (MISCELLANEOUS) ×2 IMPLANT
DECANTER SPIKE VIAL GLASS SM (MISCELLANEOUS) ×2 IMPLANT
DRAPE PROXIMA HALF (DRAPES) ×2 IMPLANT
DRSG PAD ABDOMINAL 8X10 ST (GAUZE/BANDAGES/DRESSINGS) ×2 IMPLANT
ELECT CAUTERY BLADE 6.4 (BLADE) ×2 IMPLANT
ELECT REM PT RETURN 9FT ADLT (ELECTROSURGICAL) ×2
ELECTRODE REM PT RTRN 9FT ADLT (ELECTROSURGICAL) ×1 IMPLANT
GLOVE BIO SURGEON STRL SZ8 (GLOVE) ×2 IMPLANT
GLOVE BIOGEL PI IND STRL 8 (GLOVE) ×1 IMPLANT
GLOVE BIOGEL PI INDICATOR 8 (GLOVE) ×1
GOWN STRL NON-REIN LRG LVL3 (GOWN DISPOSABLE) ×2 IMPLANT
GOWN STRL REIN XL XLG (GOWN DISPOSABLE) ×2 IMPLANT
KIT BASIN OR (CUSTOM PROCEDURE TRAY) ×2 IMPLANT
KIT ROOM TURNOVER OR (KITS) ×2 IMPLANT
NDL HYPO 25GX1X1/2 BEV (NEEDLE) ×1 IMPLANT
NEEDLE HYPO 25GX1X1/2 BEV (NEEDLE) ×2 IMPLANT
NS IRRIG 1000ML POUR BTL (IV SOLUTION) ×2 IMPLANT
PACK LITHOTOMY IV (CUSTOM PROCEDURE TRAY) ×2 IMPLANT
PAD ARMBOARD 7.5X6 YLW CONV (MISCELLANEOUS) ×2 IMPLANT
PENCIL BUTTON HOLSTER BLD 10FT (ELECTRODE) ×2 IMPLANT
SHEARS HARMONIC 9CM CVD (BLADE) IMPLANT
SPECIMEN JAR MEDIUM (MISCELLANEOUS) ×2 IMPLANT
SPONGE GAUZE 4X4 12PLY (GAUZE/BANDAGES/DRESSINGS) ×2 IMPLANT
SPONGE LAP 18X18 X RAY DECT (DISPOSABLE) ×2 IMPLANT
SPONGE SURGIFOAM ABS GEL 12-7 (HEMOSTASIS) ×2 IMPLANT
SURGILUBE 2OZ TUBE FLIPTOP (MISCELLANEOUS) ×2 IMPLANT
SUT CHROMIC 2 0 SH (SUTURE) IMPLANT
SUT MON AB 3-0 SH 27 (SUTURE)
SUT MON AB 3-0 SH27 (SUTURE) IMPLANT
SYR CONTROL 10ML LL (SYRINGE) ×2 IMPLANT
TOWEL OR 17X24 6PK STRL BLUE (TOWEL DISPOSABLE) ×2 IMPLANT
TOWEL OR 17X26 10 PK STRL BLUE (TOWEL DISPOSABLE) ×2 IMPLANT
TRAY PROCTOSCOPIC FIBER OPTIC (SET/KITS/TRAYS/PACK) IMPLANT
TUBE CONNECTING 12X1/4 (SUCTIONS) ×2 IMPLANT
UNDERPAD 30X30 INCONTINENT (UNDERPADS AND DIAPERS) ×2 IMPLANT
YANKAUER SUCT BULB TIP NO VENT (SUCTIONS) ×2 IMPLANT

## 2013-03-30 NOTE — Op Note (Signed)
Preoperative diagnosis: Anal verge skin lesion measuring 1 cm x 1.5 cm 3 cm from the anal canal  Postoperative diagnosis: Same  Procedure: Exam under anesthesia and excision of anal verge mass  Surgeon: Harriette Bouillon M.D.  Anesthesia: LMA with 0.25% Sensorcaine local with epinephrine  EBL: Minimal  Specimen: Anal verge skin lesion to pathology  Drains: None  Indications for procedure: The patient presents to to a nonhealing anal verge skin lesion. It was initially felt to be secondary to a small access but has not healed over 3 months. 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 The anal verge the lesion present for at least 3 months and has not healed. Recommended exam under anesthesia to exclude fistula and excision to exclude malignancy. Risks, benefits and alternative therapies were discussed as well as long term expectations and postoperative care. He wished to proceed.  Description of procedure: The patient was met in the holding area and questions were answered. He was taken back to the operating room and placed supine on the operating room table. After induction of anesthesia, he was placed in lithotomy in the anal canal and skin were prepped and draped in a sterile fashion. Timeout was done and he received preoperative antibiotics. Digital examination was normal. Anoscopy was then performed which showed mild grade 1 internal hemorrhoid disease involving the right posterior column without signs of bleeding. The distal rectum appear normal. No evidence of fistula. I then excised the anal verge skin lesion which is located 3 cm from the anal canal in the right posterior position. This was excised down to healthy fat and appeared to be superficial. The wound was cauterized for hemostasis. Triple antibiotic ointment was applied. Dry dressing applied. He did have small  condyloma at the base of his scrotum. No signs of abscess, fistula or fissure. He was taken out of lithotomy, awoke from anesthesia and taken to recovery in satisfactory condition. All final counts are found to be correct sponge, needles and instruments.

## 2013-03-30 NOTE — Transfer of Care (Signed)
Immediate Anesthesia Transfer of Care Note  Patient: [name]  Procedure(s) Performed: Procedure(s) with comments: EXCISION OF anal MASS (N/A) - Exam under anesthesia with excision anal verge mass  Patient Location: PACU  Anesthesia Type:General  Level of Consciousness: awake and oriented  Airway & Oxygen Therapy: Patient Spontanous Breathing and Patient connected to nasal cannula oxygen  Post-op Assessment: Report given to PACU RN  Post vital signs: Reviewed and stable  Complications: No apparent anesthesia complications

## 2013-03-30 NOTE — Preoperative (Signed)
Beta Blockers   Reason not to administer Beta Blockers:Not Applicable 

## 2013-03-30 NOTE — Progress Notes (Signed)
Patient refused for cellphone to be locked up. Denies having money with him but reports having wallet.

## 2013-03-30 NOTE — Anesthesia Procedure Notes (Signed)
Procedure Name: LMA Insertion Date/Time: 03/30/2013 8:42 AM Performed by: Jefm Miles E Pre-anesthesia Checklist: Patient identified, Timeout performed, Emergency Drugs available, Suction available and Patient being monitored Patient Re-evaluated:Patient Re-evaluated prior to inductionOxygen Delivery Method: Circle system utilized Preoxygenation: Pre-oxygenation with 100% oxygen Intubation Type: IV induction Ventilation: Mask ventilation without difficulty LMA: LMA inserted LMA Size: 4.0 Number of attempts: 1 Placement Confirmation: positive ETCO2 and breath sounds checked- equal and bilateral Tube secured with: Tape Dental Injury: Teeth and Oropharynx as per pre-operative assessment

## 2013-03-30 NOTE — Anesthesia Preprocedure Evaluation (Addendum)
Anesthesia Evaluation  Patient identified by MRN, date of birth, ID band Patient awake    Reviewed: Allergy & Precautions, H&P , NPO status , Patient's Chart, lab work & pertinent test results, reviewed documented beta blocker date and time   Airway Mallampati: II TM Distance: >3 FB Neck ROM: full    Dental  (+) Teeth Intact, Poor Dentition and Dental Advisory Given   Pulmonary neg pulmonary ROS,  breath sounds clear to auscultation        Cardiovascular hypertension, On Medications + Peripheral Vascular Disease and +CHF + Valvular Problems/Murmurs MR Rhythm:regular     Neuro/Psych negative neurological ROS  negative psych ROS   GI/Hepatic negative GI ROS, Neg liver ROS, hiatal hernia, GERD-  Medicated and Controlled,(+) Hepatitis -, C  Endo/Other  negative endocrine ROS  Renal/GU ESRF and DialysisRenal disease  negative genitourinary   Musculoskeletal   Abdominal   Peds  Hematology  (+) anemia ,   Anesthesia Other Findings See surgeon's H&P   Reproductive/Obstetrics negative OB ROS                          Anesthesia Physical Anesthesia Plan  ASA: III  Anesthesia Plan: General   Post-op Pain Management:    Induction: Intravenous  Airway Management Planned: LMA and Oral ETT  Additional Equipment:   Intra-op Plan:   Post-operative Plan: Extubation in OR  Informed Consent: I have reviewed the patients History and Physical, chart, labs and discussed the procedure including the risks, benefits and alternatives for the proposed anesthesia with the patient or authorized representative who has indicated his/her understanding and acceptance.   Dental Advisory Given  Plan Discussed with: CRNA and Surgeon  Anesthesia Plan Comments:         Anesthesia Quick Evaluation

## 2013-03-30 NOTE — Anesthesia Postprocedure Evaluation (Signed)
Anesthesia Post Note  Patient: [name]  Procedure(s) Performed: Procedure(s) (LRB): EXCISION OF anal MASS (N/A)  Anesthesia type: General  Patient location: PACU  Post pain: Pain level controlled  Post assessment: Patient's Cardiovascular Status Stable  Last Vitals:  Filed Vitals:   03/30/13 1021  BP: 147/77  Pulse: 69  Temp:   Resp: 16    Post vital signs: Reviewed and stable  Level of consciousness: alert  Complications: No apparent anesthesia complications

## 2013-03-30 NOTE — Interval H&P Note (Signed)
History and Physical Interval Note:  03/30/2013 7:50 AM  [name]  has presented today for surgery, with the diagnosis of anal mass  The various methods of treatment have been discussed with the patient and family. After consideration of risks, benefits and other options for treatment, the patient has consented to  Procedure(s): EXCISION OF anal MASS (N/A) as a surgical intervention .  The patient's history has been reviewed, patient examined, no change in status, stable for surgery.  I have reviewed the patient's chart and labs.  Questions were answered to the patient's satisfaction.     Lazar Tierce A.

## 2013-03-30 NOTE — H&P (View-Only) (Signed)
Patient ID: [name], male   DOB: [birth year], 52 y.o.   MRN: [id number]  Chief Complaint  Patient presents with  . Follow-up    3 wk reck enter fistula    HPI [name] is a 52 y.o. male.  Patient returns for followup of his enterocutaneous fistula. His percutaneous drain was removed 3 weeks ago and he is doing well. No significant abdominal pain nausea or vomiting. The tract site is healed. HPI  Past Medical History  Diagnosis Date  . ESRD needing dialysis   . Prostate cancer   . Hypertension   . Renal transplant disorder 1997    Now HD   . Gout tophi   . Chronic steroid use     For Gout transplant. Has sever gout. Did not tolerate Allopurinol. Do not taper per PCP   . Systolic CHF, chronic     2 D echo 04/2012 with EF of 45 %   . Malnutrition   . 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 F/U with Dr Dwain Sarna. Started on TNA  . Anemia associated with chronic renal failure   . Hepatitis C     Past Surgical History  Procedure Laterality Date  . Laparotomy  04/14/2012    Procedure: EXPLORATORY LAPAROTOMY;  Surgeon: Clovis Pu. Salih Williamson, MD;  Location: [facility] OR;  Service: General;  Laterality: N/A;  . Colon resection  04/14/2012  . Laparotomy  04/22/2012    Procedure: EXPLORATORY LAPAROTOMY;  Surgeon: Ardeth Sportsman, MD;  Location: [facility] OR;  Service: General;  Laterality: N/A;  lysis of adhesions, omentoplasty, repair small bowel  . Thrombectomy w/ embolectomy  04/27/2012    Procedure: THROMBECTOMY ARTERIOVENOUS GORE-TEX GRAFT;  Surgeon: Chuck Hint, MD;  Location: [facility] OR;  Service: Vascular;  Laterality: Left;  Thrombectomy of left thigh arteriovenous gortex graft  . Prostectomy  2011    Family History  Problem Relation Age of Onset  . Hypertension Father   . Pneumonia Brother   . Lung disease Brother     Social History History  Substance Use Topics  . Smoking status:  Former Games developer  . Smokeless tobacco: Not on file     Comment: Quit 30 years ago  . Alcohol Use: No    Allergies  Allergen Reactions  . Allopurinol Other (See Comments)    REACTION: decreased platelets  . Aspirin Other (See Comments)    REACTION: unknown reaction    Current Outpatient Prescriptions  Medication Sig Dispense Refill  . calcium acetate (PHOSLO) 667 MG tablet       . calcium carbonate (TUMS EX) 750 MG chewable tablet Chew 1-2 tablets by mouth daily as needed. For heartburn      . clotrimazole (MYCELEX) 10 MG troche Take 10 mg by mouth 5 (five) times daily.      Marland Kitchen COLCRYS 0.6 MG tablet       . ECK CLOTRIMAZOLE EX       . LIDOCAINE EX Apply 1 application topically See admin instructions. Apply to inject site on dialysis days (Tues, Thur, Sat) if needed for pain      . loperamide (IMODIUM A-D) 2 MG tablet Take 2 mg by mouth daily as needed. For diarrhea      . multivitamin (RENA-VIT) TABS tablet Take 1 tablet by mouth daily.  30 tablet  0  . omeprazole (PRILOSEC) 40 MG capsule       .  oxyCODONE-acetaminophen (PERCOCET/ROXICET) 5-325 MG per tablet Take 1 tablet by mouth every 6 (six) hours as needed. For pain      . predniSONE (DELTASONE) 5 MG tablet Take 5 mg by mouth daily.       No current facility-administered medications for this visit.   Facility-Administered Medications Ordered in Other Visits  Medication Dose Route Frequency Provider Last Rate Last Dose  . fentaNYL (SUBLIMAZE) injection   Intravenous PRN Abundio Miu, MD   25 mcg at 05/05/12 1544  . midazolam (VERSED) 5 MG/5ML injection   Intravenous PRN Abundio Miu, MD   2 mg at 05/05/12 1544    Review of Systems Review of Systems  Gastrointestinal: Positive for anal bleeding. Negative for abdominal pain and abdominal distention.    Blood pressure 100/60, pulse 78, temperature 97.8 F (36.6 C), temperature source Temporal, resp. rate 16, height 5\' 11"  (1.803 m), weight 108 lb 3.2 oz (49.079  kg).  Physical Exam Physical Exam  Constitutional: He is oriented to person, place, and time.  Abdominal: Soft. Bowel sounds are normal. There is no tenderness.    Genitourinary:     Musculoskeletal:  A/v access noted  Neurological: He is alert and oriented to person, place, and time.  Skin: Skin is warm and dry.  Psychiatric: He has a normal mood and affect. His behavior is normal. Judgment and thought content normal.    Data Reviewed none  Assessment    Persistent anal skin lesion measuring 1 x 1 cm with central ulceration    Plan    Excision of persistent anal skin lesion as outpatient. He has done well since his percutaneous drain has been removed with no evidence of recurrent enterocutaneous fistula or abscess. This area has persisted despite maximal medical management and observation and I feel excision will be necessary to get it to heal and to exclude malignancy. Risk of bleeding, infection, and the need for further surgery discussed. He would like to proceed.The procedure has been discussed with the patient.  Alternative therapies have been discussed with the patient.  Operative risks include bleeding,  Infection,  Organ injury,  Nerve injury,  Blood vessel injury,  DVT,  Pulmonary embolism,  Death,  And possible reoperation.  Medical management risks include worsening of present situation.  The success of the procedure is 50 -90 % at treating patients symptoms.  The patient understands and agrees to proceed.       Christionna Poland A. 03/07/2013, 11:58 AM

## 2013-03-30 NOTE — Progress Notes (Signed)
Report given to jamie rn as caregiver 

## 2013-03-31 ENCOUNTER — Encounter (HOSPITAL_COMMUNITY): Payer: Self-pay | Admitting: Surgery

## 2013-04-18 ENCOUNTER — Encounter (INDEPENDENT_AMBULATORY_CARE_PROVIDER_SITE_OTHER): Payer: Self-pay | Admitting: Surgery

## 2013-04-18 ENCOUNTER — Ambulatory Visit (INDEPENDENT_AMBULATORY_CARE_PROVIDER_SITE_OTHER): Payer: Medicare Other | Admitting: Surgery

## 2013-04-18 VITALS — BP 122/64 | HR 72 | Resp 16 | Ht 71.0 in | Wt 109.4 lb

## 2013-04-18 DIAGNOSIS — Z9889 Other specified postprocedural states: Secondary | ICD-10-CM | POA: Insufficient documentation

## 2013-04-18 NOTE — Progress Notes (Signed)
Patient returns after excision of mass verge. 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  Margins were clear.  Exam: Wounds anal canal clean. Healing well.  Impression: Status post excision of mass anal verge AIN III with clear margins  Plan: Return in 3 months. Resume full activity. Continue present wound care.

## 2013-04-18 NOTE — Patient Instructions (Signed)
Gas X or Beano before meals for gas.  Return in 3 months.  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  Continue wound care.

## 2013-07-11 ENCOUNTER — Encounter (INDEPENDENT_AMBULATORY_CARE_PROVIDER_SITE_OTHER): Payer: Self-pay | Admitting: Surgery

## 2013-07-11 ENCOUNTER — Ambulatory Visit (INDEPENDENT_AMBULATORY_CARE_PROVIDER_SITE_OTHER): Payer: Medicare Other | Admitting: Surgery

## 2013-07-11 VITALS — BP 98/68 | HR 64 | Temp 98.4°F | Resp 14 | Ht 71.0 in | Wt 109.0 lb

## 2013-07-11 DIAGNOSIS — A630 Anogenital (venereal) warts: Secondary | ICD-10-CM | POA: Insufficient documentation

## 2013-07-11 NOTE — Patient Instructions (Signed)
Return in 6 months.  Will have you follow up with Dr Maisie Fus for anal neoplasia.

## 2013-07-11 NOTE — Progress Notes (Signed)
Subjective:     Patient ID: [name], male   DOB: [birth year], 52 y.o.   MRN: [id number]  HPI  patient returns for followup for his anal condyloma. 3 months ago he underwent excision which showed grade 3 anal intraepithelial neoplasia. 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 He has regained most of his weight. He feels and  looks well.  Review of Systems  Respiratory: Negative.   Cardiovascular: Negative.   Gastrointestinal: Negative.        Objective:   Physical Exam  Abdominal: Soft. Bowel sounds are normal. He exhibits no distension. There is no tenderness. There is no rebound and no guarding.    Genitourinary:     Skin: Skin is warm and dry.  Psychiatric: He has a normal mood and affect. His behavior is normal. Judgment and thought content normal.       Assessment:     History of grade 3 anal intraepithelial neoplasia  History of small bowel obstruction with multiple laparotomies and intracutaneous fistula resolved    Plan:     Patient will need long-term followup for his anal condition. We'll have him return in 6 months to see Dr. Maisie Fus for followup. His abdominal issues have resolved and he is doing well.

## 2013-09-02 ENCOUNTER — Encounter (INDEPENDENT_AMBULATORY_CARE_PROVIDER_SITE_OTHER): Payer: Self-pay | Admitting: Surgery

## 2013-09-02 ENCOUNTER — Ambulatory Visit (INDEPENDENT_AMBULATORY_CARE_PROVIDER_SITE_OTHER): Payer: Medicare Other | Admitting: Surgery

## 2013-09-02 VITALS — BP 98/60 | HR 60 | Temp 99.1°F | Resp 14 | Ht 71.0 in | Wt 106.4 lb

## 2013-09-02 DIAGNOSIS — A630 Anogenital (venereal) warts: Secondary | ICD-10-CM

## 2013-09-02 NOTE — Patient Instructions (Signed)
Return 6 months

## 2013-09-02 NOTE — Progress Notes (Signed)
Subjective:     Patient ID: [name], male   DOB: [birth year], 52 y.o.   MRN: [id number]  HPI  patient returns for followup for his anal condyloma. 3 months ago he underwent excision which showed grade 3 anal intraepithelial neoplasia. 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 He has regained most of his weight. He feels and  looks well. Pt having dark stools and felt to be melanotic.  This has stopped and BM now normal.  Tested and found to be heme positive. No abdominal pain.   Review of Systems  Respiratory: Negative.   Cardiovascular: Negative.   Gastrointestinal: Negative.        Objective:   Physical Exam  Abdominal: Soft. Bowel sounds are normal. He exhibits no distension. There is no tenderness. There is no rebound and no guarding.    Genitourinary:     Skin: Skin is warm and dry.  Psychiatric: He has a normal mood and affect. His behavior is normal. Judgment and thought content normal.       Assessment:     History of grade 3 anal intraepithelial neoplasia  History of small bowel obstruction with multiple laparotomies and intracutaneous fistula resolved    Plan:     Patient will need long-term followup for his anal condition. We'll have him return in 6 months to see Dr. Maisie Fus for followup. His abdominal issues have resolved and he is doing well. No evidence of any more dark stools. Continue to follow.  If this recurs recommend GI consult.

## 2013-12-14 ENCOUNTER — Other Ambulatory Visit (HOSPITAL_COMMUNITY): Payer: Self-pay | Admitting: Nephrology

## 2013-12-14 DIAGNOSIS — T8611 Kidney transplant rejection: Secondary | ICD-10-CM

## 2013-12-14 DIAGNOSIS — T8612 Kidney transplant failure: Secondary | ICD-10-CM

## 2013-12-14 DIAGNOSIS — Z940 Kidney transplant status: Secondary | ICD-10-CM

## 2013-12-14 DIAGNOSIS — I10 Essential (primary) hypertension: Secondary | ICD-10-CM

## 2013-12-14 DIAGNOSIS — I998 Other disorder of circulatory system: Secondary | ICD-10-CM

## 2013-12-14 DIAGNOSIS — Z992 Dependence on renal dialysis: Secondary | ICD-10-CM

## 2013-12-14 DIAGNOSIS — T829XXA Unspecified complication of cardiac and vascular prosthetic device, implant and graft, initial encounter: Secondary | ICD-10-CM

## 2013-12-14 DIAGNOSIS — N186 End stage renal disease: Secondary | ICD-10-CM

## 2013-12-16 ENCOUNTER — Ambulatory Visit (HOSPITAL_COMMUNITY): Payer: Medicare Other

## 2013-12-19 ENCOUNTER — Other Ambulatory Visit: Payer: Self-pay | Admitting: *Deleted

## 2013-12-19 DIAGNOSIS — L97909 Non-pressure chronic ulcer of unspecified part of unspecified lower leg with unspecified severity: Secondary | ICD-10-CM

## 2013-12-19 DIAGNOSIS — I739 Peripheral vascular disease, unspecified: Secondary | ICD-10-CM

## 2013-12-19 DIAGNOSIS — T82598A Other mechanical complication of other cardiac and vascular devices and implants, initial encounter: Secondary | ICD-10-CM

## 2013-12-26 ENCOUNTER — Encounter: Payer: Self-pay | Admitting: *Deleted

## 2013-12-26 ENCOUNTER — Telehealth: Payer: Self-pay | Admitting: Surgery

## 2013-12-26 ENCOUNTER — Other Ambulatory Visit: Payer: Self-pay | Admitting: *Deleted

## 2013-12-26 ENCOUNTER — Ambulatory Visit (HOSPITAL_COMMUNITY)
Admission: RE | Admit: 2013-12-26 | Discharge: 2013-12-26 | Disposition: A | Discharge status: Home / Self Care | Payer: Medicare Other | Source: Ambulatory Visit | Attending: Surgery | Admitting: Surgery

## 2013-12-26 ENCOUNTER — Encounter: Payer: Self-pay | Admitting: Surgery

## 2013-12-26 ENCOUNTER — Ambulatory Visit (INDEPENDENT_AMBULATORY_CARE_PROVIDER_SITE_OTHER): Payer: Medicare Other | Admitting: Surgery

## 2013-12-26 VITALS — BP 177/79 | HR 80 | Temp 97.5°F | Ht 71.0 in | Wt 108.2 lb

## 2013-12-26 DIAGNOSIS — I739 Peripheral vascular disease, unspecified: Secondary | ICD-10-CM | POA: Insufficient documentation

## 2013-12-26 DIAGNOSIS — L97909 Non-pressure chronic ulcer of unspecified part of unspecified lower leg with unspecified severity: Secondary | ICD-10-CM | POA: Insufficient documentation

## 2013-12-26 MED ORDER — LEVOFLOXACIN 250 MG PO TABS: 250.0000 mg | ORAL_TABLET | Freq: Every day | ORAL | Status: DC

## 2013-12-26 NOTE — Telephone Encounter (Addendum)
Message copied by Gena Fray on Mon Dec 26, 2013 10:11 AM ------      Message from: Denman George      Created: Thu Dec 22, 2013 12:11 PM      Regarding: request for earlier appt. for vasc problems       Renee called from [facility] and stated the MD there wants the pt. 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  Are you able to move him up?  Please call Renee @ [phone] ------  12/26/13: spoke with pt, moved up to today with VWB as per VWB instruction, dpm

## 2013-12-26 NOTE — Progress Notes (Signed)
Patient name: [name] MRN: [id number] DOB: Jan 23, 1961 Sex: male   Referred by: Dr. Detterding  Reason for referral:  Chief Complaint  Patient presents with  . New Evaluation    gangrene/ischemic changes to L foot, ulcer on R 3rd digit    HISTORY OF PRESENT ILLNESS: This is a 53 year old gentleman who was referred today for evaluation of bilateral toe ulcers.  The patient states that these have been present for approximately 3 months.  They cause him significant rest pain.  The patient suffers from end-stage renal disease.  He has a left thigh graft in place for dialysis on Tuesday Thursday Saturday.  He has hypertension which has gotten worse since his pain has escalated.  He has chronic congestive heart failure.  His most recent echo shows an ejection fraction of 40-50%  Past Medical History  Diagnosis Date  . Prostate cancer   . Hypertension   . Renal transplant disorder 1997    Now HD   . Gout tophi   . Chronic steroid use     For Gout transplant. Has sever gout. Did not tolerate Allopurinol. Do not taper per PCP   . Systolic CHF, chronic     2 D echo 04/2012 with EF of 45 %   . Malnutrition   . 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 F/U with Dr Donne Hazel. Started on TNA  . Anemia associated with chronic renal failure   . Hepatitis C   . Heart murmur   . ESRD needing dialysis     [facility]  . GERD (gastroesophageal reflux disease)   . H/O hiatal hernia   . Arthritis   . DVT (deep venous thrombosis)   . Peripheral vascular disease     Past Surgical History  Procedure Laterality Date  . Laparotomy  04/14/2012    Procedure: EXPLORATORY LAPAROTOMY;  Surgeon: Joyice Faster. Cornett, MD;  Location: [facility];  Service: General;  Laterality: N/A;  . Colon resection  04/14/2012  . Laparotomy  04/22/2012    Procedure: EXPLORATORY LAPAROTOMY;  Surgeon: Adin Hector, MD;  Location: [facility];  Service: General;  Laterality: N/A;  lysis of adhesions, omentoplasty, repair small bowel  . Thrombectomy w/ embolectomy  04/27/2012    Procedure: THROMBECTOMY ARTERIOVENOUS GORE-TEX GRAFT;  Surgeon: Angelia Mould, MD;  Location: [facility];  Service: Vascular;  Laterality: Left;  Thrombectomy of left thigh arteriovenous gortex graft  . Prostectomy  2011  . Renal grafts    . Pelvic abcess drainage Right 6/14    removal drain s/p bowl resection 13  . Transanal excision of rectal mass N/A 03/30/2013    Procedure: EXCISION OF anal MASS;  Surgeon: Joyice Faster. Cornett, MD;  Location: [facility];  Service: General;  Laterality: N/A;  Exam under anesthesia with excision anal verge mass    History   Social History  . Marital Status: Single    Spouse Name: N/A    Number of Children: N/A  . Years of Education: N/A   Occupational History  . Not on file.   Social History Main Topics  . Smoking status: Former Smoker -- 1.00 packs/day for 10 years    Types: Cigarettes    Quit date: 03/23/1973  . Smokeless tobacco: Not on file     Comment: Quit 30 years ago  . Alcohol Use: No  . Drug Use: No  .  Sexual Activity: No   Other Topics Concern  . Not on file   Social History Narrative   Currently in the nursing home until end of the month then will go to live with sister.           Family History  Problem Relation Age of Onset  . Hypertension Father   . Pneumonia Brother   . Lung disease Brother     Allergies as of 12/26/2013 - Review Complete 12/26/2013  Allergen Reaction Noted  . Allopurinol Other (See Comments) 02/27/2010  . Aspirin Other (See Comments) 02/27/2010    Current Outpatient Prescriptions on File Prior to Visit  Medication Sig Dispense Refill  . calcium acetate (PHOSLO) 667 MG tablet Take 2,001 mg by mouth 3 (three) times daily.       . cinacalcet (SENSIPAR) 60 MG tablet Take 60 mg by mouth daily.      . clotrimazole (MYCELEX) 10 MG troche Take  10 mg by mouth 4 (four) times daily.       Marland Kitchen COLCRYS 0.6 MG tablet Take 0.6 mg by mouth daily.       Marland Kitchen LIDOCAINE EX Apply 1 application topically See admin instructions. Apply to inject site on dialysis days (Tues, Thur, Sat) if needed for pain      . loperamide (IMODIUM A-D) 2 MG tablet Take 2 mg by mouth daily as needed. For diarrhea      . multivitamin (RENA-VIT) TABS tablet Take 1 tablet by mouth daily.  30 tablet  0  . omeprazole (PRILOSEC) 40 MG capsule Take 40 mg by mouth daily.       Marland Kitchen oxyCODONE-acetaminophen (PERCOCET/ROXICET) 5-325 MG per tablet Take 1 tablet by mouth every 6 (six) hours as needed for pain. For pain      . predniSONE (DELTASONE) 5 MG tablet Take 5 mg by mouth daily.       Current Facility-Administered Medications on File Prior to Visit  Medication Dose Route Frequency Provider Last Rate Last Dose  . fentaNYL (SUBLIMAZE) injection   Intravenous PRN Carylon Perches, MD   25 mcg at 05/05/12 1544  . midazolam (VERSED) 5 MG/5ML injection   Intravenous PRN Carylon Perches, MD   2 mg at 05/05/12 1544     REVIEW OF SYSTEMS: Cardiovascular: Positive for chest pain, a lateral leg pain Pulmonary: No productive cough, asthma or wheezing. Neurologic: Positive for leg weakness Hematologic: No bleeding problems or clotting disorders. Musculoskeletal: No joint pain or joint swelling. Gastrointestinal: No blood in stool or hematemesis Genitourinary: No dysuria or hematuria. Psychiatric:: No history of major depression. Integumentary: No rashes or ulcers. Constitutional: No fever or chills.  PHYSICAL EXAMINATION: General: The patient appears their stated age.  Vital signs are BP 177/79  Pulse 80  Temp(Src) 97.5 F (36.4 C) (Oral)  Ht 5\' 11"  (1.803 m)  Wt 108 lb 3.2 oz (49.079 kg)  BMI 15.10 kg/m2  SpO2 100% HEENT:  No gross abnormalities Pulmonary: Respirations are non-labored Abdomen: Soft and non-tender  Musculoskeletal: There are no major deformities.   Neurologic:  No focal weakness or paresthesias are detected, Skin: Right great toe ulceration along the tendon, extending to the second toe.  Ischemic changes to the tip of the left great toe with surrounding erythema Psychiatric: The patient has normal affect. Cardiovascular: There is a regular rate and rhythm without significant murmur appreciated.  Palpable thrill within left thigh graft.  No palpable pedal pulses  Diagnostic Studies: ABI studies could not be performed  today secondary to calcified vessels.  The patient has monophasic waveforms bilaterally   Assessment:  Bilateral lower extremity ulcerations, secondary to arterial insufficiency Plan: The patient needs to have better imaging of his lower extremity disease.  I have scheduled an arteriogram for Tuesday, April 14.  L. plan on access in the right groin with bilateral lower extremity evaluation and intervention as indicated.  I am going to treat the patient with one week of Levaquin to see if some of the cellulitis resolves.  This may also help his discomfort.  I discussed 3 potential options after the angiogram.  #1 would be disease amenable to percutaneous stenting.  #2 would be disease amenable to surgical revascularization and #3 would be disease which cannot be treated.  I did discuss that this is a limb threatening situation.  We also discussed the possibility of needing to remove his left thigh graft.     Eldridge Abrahams, M.D. [facility] Office: [phone] Pager:  [phone]

## 2014-01-02 ENCOUNTER — Encounter (HOSPITAL_COMMUNITY): Payer: Self-pay | Admitting: Pharmacist

## 2014-01-03 ENCOUNTER — Encounter (HOSPITAL_COMMUNITY)
Admission: RE | Disposition: A | Discharge status: Home / Self Care | Payer: Self-pay | Source: Ambulatory Visit | Attending: Surgery

## 2014-01-03 ENCOUNTER — Inpatient Hospital Stay (HOSPITAL_COMMUNITY)
Admission: RE | Admit: 2014-01-03 | Discharge: 2014-01-05 | DRG: 252 | Disposition: A | Discharge status: Home / Self Care | Payer: Medicare Other | Source: Ambulatory Visit | Attending: Surgery | Admitting: Surgery

## 2014-01-03 ENCOUNTER — Other Ambulatory Visit: Payer: Self-pay | Admitting: *Deleted

## 2014-01-03 DIAGNOSIS — L97909 Non-pressure chronic ulcer of unspecified part of unspecified lower leg with unspecified severity: Secondary | ICD-10-CM | POA: Diagnosis present

## 2014-01-03 DIAGNOSIS — E46 Unspecified protein-calorie malnutrition: Secondary | ICD-10-CM | POA: Diagnosis present

## 2014-01-03 DIAGNOSIS — B182 Chronic viral hepatitis C: Secondary | ICD-10-CM | POA: Diagnosis present

## 2014-01-03 DIAGNOSIS — E43 Unspecified severe protein-calorie malnutrition: Secondary | ICD-10-CM | POA: Insufficient documentation

## 2014-01-03 DIAGNOSIS — I10 Essential (primary) hypertension: Secondary | ICD-10-CM

## 2014-01-03 DIAGNOSIS — L97509 Non-pressure chronic ulcer of other part of unspecified foot with unspecified severity: Secondary | ICD-10-CM | POA: Diagnosis present

## 2014-01-03 DIAGNOSIS — L97809 Non-pressure chronic ulcer of other part of unspecified lower leg with unspecified severity: Secondary | ICD-10-CM | POA: Diagnosis present

## 2014-01-03 DIAGNOSIS — R079 Chest pain, unspecified: Secondary | ICD-10-CM

## 2014-01-03 DIAGNOSIS — I70269 Atherosclerosis of native arteries of extremities with gangrene, unspecified extremity: Principal | ICD-10-CM | POA: Diagnosis present

## 2014-01-03 DIAGNOSIS — I428 Other cardiomyopathies: Secondary | ICD-10-CM | POA: Diagnosis present

## 2014-01-03 DIAGNOSIS — B1920 Unspecified viral hepatitis C without hepatic coma: Secondary | ICD-10-CM | POA: Diagnosis present

## 2014-01-03 DIAGNOSIS — D649 Anemia, unspecified: Secondary | ICD-10-CM

## 2014-01-03 DIAGNOSIS — M899 Disorder of bone, unspecified: Secondary | ICD-10-CM | POA: Diagnosis present

## 2014-01-03 DIAGNOSIS — L98499 Non-pressure chronic ulcer of skin of other sites with unspecified severity: Secondary | ICD-10-CM

## 2014-01-03 DIAGNOSIS — K219 Gastro-esophageal reflux disease without esophagitis: Secondary | ICD-10-CM | POA: Diagnosis present

## 2014-01-03 DIAGNOSIS — Z86718 Personal history of other venous thrombosis and embolism: Secondary | ICD-10-CM

## 2014-01-03 DIAGNOSIS — IMO0002 Reserved for concepts with insufficient information to code with codable children: Secondary | ICD-10-CM

## 2014-01-03 DIAGNOSIS — I120 Hypertensive chronic kidney disease with stage 5 chronic kidney disease or end stage renal disease: Secondary | ICD-10-CM | POA: Diagnosis present

## 2014-01-03 DIAGNOSIS — N2581 Secondary hyperparathyroidism of renal origin: Secondary | ICD-10-CM | POA: Diagnosis present

## 2014-01-03 DIAGNOSIS — I509 Heart failure, unspecified: Secondary | ICD-10-CM | POA: Diagnosis present

## 2014-01-03 DIAGNOSIS — Z8546 Personal history of malignant neoplasm of prostate: Secondary | ICD-10-CM

## 2014-01-03 DIAGNOSIS — Z992 Dependence on renal dialysis: Secondary | ICD-10-CM

## 2014-01-03 DIAGNOSIS — N039 Chronic nephritic syndrome with unspecified morphologic changes: Secondary | ICD-10-CM

## 2014-01-03 DIAGNOSIS — M949 Disorder of cartilage, unspecified: Secondary | ICD-10-CM

## 2014-01-03 DIAGNOSIS — I5022 Chronic systolic (congestive) heart failure: Secondary | ICD-10-CM | POA: Diagnosis present

## 2014-01-03 DIAGNOSIS — Z940 Kidney transplant status: Secondary | ICD-10-CM

## 2014-01-03 DIAGNOSIS — N186 End stage renal disease: Secondary | ICD-10-CM | POA: Diagnosis present

## 2014-01-03 DIAGNOSIS — M109 Gout, unspecified: Secondary | ICD-10-CM | POA: Diagnosis present

## 2014-01-03 DIAGNOSIS — D631 Anemia in chronic kidney disease: Secondary | ICD-10-CM | POA: Diagnosis present

## 2014-01-03 DIAGNOSIS — I739 Peripheral vascular disease, unspecified: Secondary | ICD-10-CM

## 2014-01-03 HISTORY — PX: ABDOMINAL AORTAGRAM: SHX5454

## 2014-01-03 LAB — POCT ACTIVATED CLOTTING TIME
ACTIVATED CLOTTING TIME: 204 s
ACTIVATED CLOTTING TIME: 204 s
ACTIVATED CLOTTING TIME: 188 s
Activated Clotting Time: 227 seconds

## 2014-01-03 LAB — POCT I-STAT, CHEM 8
BUN: 52 mg/dL — ABNORMAL HIGH (ref 6–23)
CALCIUM ION: 1.09 mmol/L — AB (ref 1.12–1.23)
CHLORIDE: 105 meq/L (ref 96–112)
Creatinine, Ser: 12.4 mg/dL — ABNORMAL HIGH (ref 0.50–1.35)
GLUCOSE: 80 mg/dL (ref 70–99)
HCT: 38 % — ABNORMAL LOW (ref 39.0–52.0)
Hemoglobin: 12.9 g/dL — ABNORMAL LOW (ref 13.0–17.0)
POTASSIUM: 3.8 meq/L (ref 3.7–5.3)
Sodium: 143 mEq/L (ref 137–147)
TCO2: 26 mmol/L (ref 0–100)

## 2014-01-03 LAB — MRSA PCR SCREENING: MRSA by PCR: NEGATIVE

## 2014-01-03 LAB — TROPONIN I: Troponin I: <0.3 ng/mL (ref <0.30)

## 2014-01-03 SURGERY — ABDOMINAL AORTAGRAM
Anesthesia: LOCAL

## 2014-01-03 MED ORDER — PHENOL 1.4 % MT LIQD: 1.0000 | OROMUCOSAL | Status: DC | PRN

## 2014-01-03 MED ORDER — HYDRALAZINE HCL 20 MG/ML IJ SOLN
INTRAMUSCULAR | Status: AC
  Filled 2014-01-03: qty 1

## 2014-01-03 MED ORDER — MORPHINE SULFATE 10 MG/ML IJ SOLN
2.0000 mg | INTRAMUSCULAR | Status: DC | PRN
  Administered 2014-01-03: 2 mg via INTRAVENOUS

## 2014-01-03 MED ORDER — ACETAMINOPHEN 650 MG RE SUPP: 325.0000 mg | RECTAL | Status: DC | PRN

## 2014-01-03 MED ORDER — SODIUM CHLORIDE 0.9 % IJ SOLN: 3.0000 mL | INTRAMUSCULAR | Status: DC | PRN

## 2014-01-03 MED ORDER — MORPHINE SULFATE 2 MG/ML IJ SOLN
INTRAMUSCULAR | Status: AC
  Filled 2014-01-03: qty 1

## 2014-01-03 MED ORDER — OXYCODONE HCL 5 MG PO TABS
5.0000 mg | ORAL_TABLET | ORAL | Status: DC | PRN
  Administered 2014-01-04 – 2014-01-05 (×2): 10 mg via ORAL
  Filled 2014-01-03 (×2): qty 2

## 2014-01-03 MED ORDER — METOPROLOL TARTRATE 1 MG/ML IV SOLN
2.0000 mg | INTRAVENOUS | Status: AC | PRN
  Administered 2014-01-03: 2 mg via INTRAVENOUS
  Administered 2014-01-03: 5 mg via INTRAVENOUS
  Filled 2014-01-03: qty 5

## 2014-01-03 MED ORDER — NITROGLYCERIN IN D5W 200-5 MCG/ML-% IV SOLN
2.0000 ug/min | INTRAVENOUS | Status: DC
  Administered 2014-01-03: 30 ug/min via INTRAVENOUS

## 2014-01-03 MED ORDER — SODIUM CHLORIDE 0.9 % IV SOLN: 250.0000 mL | INTRAVENOUS | Status: DC | PRN

## 2014-01-03 MED ORDER — MIDAZOLAM HCL 2 MG/2ML IJ SOLN
INTRAMUSCULAR | Status: AC
Start: 2014-01-03 — End: 2014-01-03
  Filled 2014-01-03: qty 2

## 2014-01-03 MED ORDER — VERAPAMIL HCL 2.5 MG/ML IV SOLN
INTRAVENOUS | Status: AC
  Filled 2014-01-03: qty 2

## 2014-01-03 MED ORDER — MORPHINE SULFATE 2 MG/ML IJ SOLN
2.0000 mg | INTRAMUSCULAR | Status: DC | PRN
  Administered 2014-01-04 – 2014-01-05 (×4): 2 mg via INTRAVENOUS
  Filled 2014-01-03 (×2): qty 1

## 2014-01-03 MED ORDER — HEPARIN (PORCINE) IN NACL 2-0.9 UNIT/ML-% IJ SOLN
INTRAMUSCULAR | Status: AC
  Filled 2014-01-03: qty 1000

## 2014-01-03 MED ORDER — GUAIFENESIN-DM 100-10 MG/5ML PO SYRP: 15.0000 mL | ORAL_SOLUTION | ORAL | Status: DC | PRN

## 2014-01-03 MED ORDER — SODIUM CHLORIDE 0.9 % IJ SOLN
3.0000 mL | Freq: Two times a day (BID) | INTRAMUSCULAR | Status: DC
  Administered 2014-01-04: 6 mL via INTRAVENOUS
  Administered 2014-01-05: 3 mL via INTRAVENOUS

## 2014-01-03 MED ORDER — ALUM & MAG HYDROXIDE-SIMETH 200-200-20 MG/5ML PO SUSP
ORAL | Status: AC
  Filled 2014-01-03: qty 30

## 2014-01-03 MED ORDER — MIDAZOLAM HCL 2 MG/2ML IJ SOLN
INTRAMUSCULAR | Status: AC
  Filled 2014-01-03: qty 2

## 2014-01-03 MED ORDER — METOPROLOL TARTRATE 1 MG/ML IV SOLN
INTRAVENOUS | Status: AC
  Filled 2014-01-03: qty 5

## 2014-01-03 MED ORDER — LABETALOL HCL 5 MG/ML IV SOLN
10.0000 mg | INTRAVENOUS | Status: DC | PRN
  Administered 2014-01-03: 10 mg via INTRAVENOUS
  Filled 2014-01-03: qty 4

## 2014-01-03 MED ORDER — ALUM & MAG HYDROXIDE-SIMETH 200-200-20 MG/5ML PO SUSP
15.0000 mL | ORAL | Status: DC | PRN
  Administered 2014-01-03: 30 mL via ORAL
  Filled 2014-01-03: qty 30

## 2014-01-03 MED ORDER — NITROGLYCERIN IN D5W 200-5 MCG/ML-% IV SOLN
2.0000 ug/min | INTRAVENOUS | Status: DC
  Administered 2014-01-03: 19:00:00 via INTRAVENOUS

## 2014-01-03 MED ORDER — HYDRALAZINE HCL 20 MG/ML IJ SOLN
10.0000 mg | INTRAMUSCULAR | Status: DC | PRN
  Administered 2014-01-03: 10 mg via INTRAVENOUS

## 2014-01-03 MED ORDER — ONDANSETRON HCL 4 MG/2ML IJ SOLN: 4.0000 mg | Freq: Four times a day (QID) | INTRAMUSCULAR | Status: DC | PRN

## 2014-01-03 MED ORDER — HEPARIN SODIUM (PORCINE) 1000 UNIT/ML IJ SOLN
INTRAMUSCULAR | Status: AC
  Filled 2014-01-03: qty 1

## 2014-01-03 MED ORDER — NITROGLYCERIN IN D5W 200-5 MCG/ML-% IV SOLN
INTRAVENOUS | Status: AC
  Filled 2014-01-03: qty 250

## 2014-01-03 MED ORDER — LIDOCAINE HCL (PF) 1 % IJ SOLN
INTRAMUSCULAR | Status: AC
  Filled 2014-01-03: qty 30

## 2014-01-03 MED ORDER — ACETAMINOPHEN 325 MG PO TABS
325.0000 mg | ORAL_TABLET | ORAL | Status: DC | PRN
  Filled 2014-01-03: qty 2

## 2014-01-03 MED ORDER — LABETALOL HCL 5 MG/ML IV SOLN
0.5000 mg/min | INTRAVENOUS | Status: DC
  Administered 2014-01-03: 0.5 mg/min via INTRAVENOUS
  Filled 2014-01-03: qty 100

## 2014-01-03 MED ORDER — SODIUM CHLORIDE 0.9 % IV SOLN
250.0000 mL | INTRAVENOUS | Status: DC | PRN
  Administered 2014-01-03: 250 mL via INTRAVENOUS

## 2014-01-03 MED ORDER — SODIUM CHLORIDE 0.9 % IJ SOLN: 3.0000 mL | Freq: Two times a day (BID) | INTRAMUSCULAR | Status: DC

## 2014-01-03 MED ORDER — FENTANYL CITRATE 0.05 MG/ML IJ SOLN
INTRAMUSCULAR | Status: AC
  Filled 2014-01-03: qty 2

## 2014-01-03 SURGICAL SUPPLY — 55 items
ADH SKN CLS APL DERMABOND .7 (GAUZE/BANDAGES/DRESSINGS) ×3
BANDAGE ELASTIC 4 VELCRO ST LF (GAUZE/BANDAGES/DRESSINGS) IMPLANT
BANDAGE ESMARK 6X9 LF (GAUZE/BANDAGES/DRESSINGS) IMPLANT
BNDG CMPR 9X6 STRL LF SNTH (GAUZE/BANDAGES/DRESSINGS)
BNDG ESMARK 6X9 LF (GAUZE/BANDAGES/DRESSINGS)
CANISTER SUCTION 2500CC (MISCELLANEOUS) ×4 IMPLANT
CLIP TI MEDIUM 24 (CLIP) ×4 IMPLANT
CLIP TI WIDE RED SMALL 24 (CLIP) ×4 IMPLANT
COVER SURGICAL LIGHT HANDLE (MISCELLANEOUS) ×4 IMPLANT
CUFF TOURNIQUET SINGLE 24IN (TOURNIQUET CUFF) IMPLANT
CUFF TOURNIQUET SINGLE 34IN LL (TOURNIQUET CUFF) IMPLANT
CUFF TOURNIQUET SINGLE 44IN (TOURNIQUET CUFF) IMPLANT
DERMABOND ADVANCED (GAUZE/BANDAGES/DRESSINGS) ×1
DERMABOND ADVANCED .7 DNX12 (GAUZE/BANDAGES/DRESSINGS) ×3 IMPLANT
DRAIN CHANNEL 15F RND FF W/TCR (WOUND CARE) IMPLANT
DRAPE WARM FLUID 44X44 (DRAPE) ×4 IMPLANT
DRAPE X-RAY CASS 24X20 (DRAPES) IMPLANT
DRSG COVADERM 4X10 (GAUZE/BANDAGES/DRESSINGS) IMPLANT
DRSG COVADERM 4X8 (GAUZE/BANDAGES/DRESSINGS) IMPLANT
ELECT REM PT RETURN 9FT ADLT (ELECTROSURGICAL) ×4
ELECTRODE REM PT RTRN 9FT ADLT (ELECTROSURGICAL) ×3 IMPLANT
EVACUATOR SILICONE 100CC (DRAIN) IMPLANT
GLOVE BIOGEL PI IND STRL 7.5 (GLOVE) ×3 IMPLANT
GLOVE BIOGEL PI INDICATOR 7.5 (GLOVE) ×1
GLOVE SURG SS PI 7.5 STRL IVOR (GLOVE) ×4 IMPLANT
GOWN PREVENTION PLUS XXLARGE (GOWN DISPOSABLE) ×4 IMPLANT
GOWN STRL NON-REIN LRG LVL3 (GOWN DISPOSABLE) ×12 IMPLANT
HEMOSTAT SNOW SURGICEL 2X4 (HEMOSTASIS) IMPLANT
KIT BASIN OR (CUSTOM PROCEDURE TRAY) ×4 IMPLANT
KIT ROOM TURNOVER OR (KITS) ×4 IMPLANT
MARKER GRAFT CORONARY BYPASS (MISCELLANEOUS) IMPLANT
NS IRRIG 1000ML POUR BTL (IV SOLUTION) ×8 IMPLANT
PACK PERIPHERAL VASCULAR (CUSTOM PROCEDURE TRAY) ×4 IMPLANT
PAD ARMBOARD 7.5X6 YLW CONV (MISCELLANEOUS) ×8 IMPLANT
PADDING CAST COTTON 6X4 STRL (CAST SUPPLIES) IMPLANT
SET COLLECT BLD 21X3/4 12 (NEEDLE) IMPLANT
STOPCOCK 4 WAY LG BORE MALE ST (IV SETS) IMPLANT
SUT ETHILON 3 0 PS 1 (SUTURE) IMPLANT
SUT PROLENE 5 0 C 1 24 (SUTURE) ×4 IMPLANT
SUT PROLENE 6 0 BV (SUTURE) ×4 IMPLANT
SUT PROLENE 7 0 BV 1 (SUTURE) IMPLANT
SUT SILK 2 0 SH (SUTURE) ×4 IMPLANT
SUT SILK 3 0 (SUTURE)
SUT SILK 3-0 18XBRD TIE 12 (SUTURE) IMPLANT
SUT VIC AB 2-0 CT1 27 (SUTURE) ×8
SUT VIC AB 2-0 CT1 TAPERPNT 27 (SUTURE) ×6 IMPLANT
SUT VIC AB 3-0 SH 27 (SUTURE) ×8
SUT VIC AB 3-0 SH 27X BRD (SUTURE) ×6 IMPLANT
SUT VICRYL 4-0 PS2 18IN ABS (SUTURE) ×8 IMPLANT
TOWEL OR 17X24 6PK STRL BLUE (TOWEL DISPOSABLE) ×8 IMPLANT
TOWEL OR 17X26 10 PK STRL BLUE (TOWEL DISPOSABLE) ×8 IMPLANT
TRAY FOLEY CATH 16FRSI W/METER (SET/KITS/TRAYS/PACK) ×4 IMPLANT
TUBING EXTENTION W/L.L. (IV SETS) IMPLANT
UNDERPAD 30X30 INCONTINENT (UNDERPADS AND DIAPERS) ×4 IMPLANT
WATER STERILE IRR 1000ML POUR (IV SOLUTION) ×4 IMPLANT

## 2014-01-03 NOTE — Progress Notes (Signed)
After LE angiogram, pt began c/o chest pain.  EKG shows mild changes from pre-op.  Plan for admission and serial troponin levels.  Will repeat EKG in am and ask cardiology to evaluate.  NTG initiated.  [name]

## 2014-01-03 NOTE — H&P (View-Only) (Signed)
Patient name: [name] MRN: [id number] DOB: Jan 23, 1961 Sex: male   Referred by: Dr. Detterding  Reason for referral:  Chief Complaint  Patient presents with  . New Evaluation    gangrene/ischemic changes to L foot, ulcer on R 3rd digit    HISTORY OF PRESENT ILLNESS: This is a 53 year old gentleman who was referred today for evaluation of bilateral toe ulcers.  The patient states that these have been present for approximately 3 months.  They cause him significant rest pain.  The patient suffers from end-stage renal disease.  He has a left thigh graft in place for dialysis on Tuesday Thursday Saturday.  He has hypertension which has gotten worse since his pain has escalated.  He has chronic congestive heart failure.  His most recent echo shows an ejection fraction of 40-50%  Past Medical History  Diagnosis Date  . Prostate cancer   . Hypertension   . Renal transplant disorder 1997    Now HD   . Gout tophi   . Chronic steroid use     For Gout transplant. Has sever gout. Did not tolerate Allopurinol. Do not taper per PCP   . Systolic CHF, chronic     2 D echo 04/2012 with EF of 45 %   . Malnutrition   . 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 F/U with Dr Donne Hazel. Started on TNA  . Anemia associated with chronic renal failure   . Hepatitis C   . Heart murmur   . ESRD needing dialysis     [facility]  . GERD (gastroesophageal reflux disease)   . H/O hiatal hernia   . Arthritis   . DVT (deep venous thrombosis)   . Peripheral vascular disease     Past Surgical History  Procedure Laterality Date  . Laparotomy  04/14/2012    Procedure: EXPLORATORY LAPAROTOMY;  Surgeon: Joyice Faster. Cornett, MD;  Location: [facility];  Service: General;  Laterality: N/A;  . Colon resection  04/14/2012  . Laparotomy  04/22/2012    Procedure: EXPLORATORY LAPAROTOMY;  Surgeon: Adin Hector, MD;  Location: [facility];  Service: General;  Laterality: N/A;  lysis of adhesions, omentoplasty, repair small bowel  . Thrombectomy w/ embolectomy  04/27/2012    Procedure: THROMBECTOMY ARTERIOVENOUS GORE-TEX GRAFT;  Surgeon: Angelia Mould, MD;  Location: [facility];  Service: Vascular;  Laterality: Left;  Thrombectomy of left thigh arteriovenous gortex graft  . Prostectomy  2011  . Renal grafts    . Pelvic abcess drainage Right 6/14    removal drain s/p bowl resection 13  . Transanal excision of rectal mass N/A 03/30/2013    Procedure: EXCISION OF anal MASS;  Surgeon: Joyice Faster. Cornett, MD;  Location: [facility];  Service: General;  Laterality: N/A;  Exam under anesthesia with excision anal verge mass    History   Social History  . Marital Status: Single    Spouse Name: N/A    Number of Children: N/A  . Years of Education: N/A   Occupational History  . Not on file.   Social History Main Topics  . Smoking status: Former Smoker -- 1.00 packs/day for 10 years    Types: Cigarettes    Quit date: 03/23/1973  . Smokeless tobacco: Not on file     Comment: Quit 30 years ago  . Alcohol Use: No  . Drug Use: No  .  Sexual Activity: No   Other Topics Concern  . Not on file   Social History Narrative   Currently in the nursing home until end of the month then will go to live with sister.           Family History  Problem Relation Age of Onset  . Hypertension Father   . Pneumonia Brother   . Lung disease Brother     Allergies as of 12/26/2013 - Review Complete 12/26/2013  Allergen Reaction Noted  . Allopurinol Other (See Comments) 02/27/2010  . Aspirin Other (See Comments) 02/27/2010    Current Outpatient Prescriptions on File Prior to Visit  Medication Sig Dispense Refill  . calcium acetate (PHOSLO) 667 MG tablet Take 2,001 mg by mouth 3 (three) times daily.       . cinacalcet (SENSIPAR) 60 MG tablet Take 60 mg by mouth daily.      . clotrimazole (MYCELEX) 10 MG troche Take  10 mg by mouth 4 (four) times daily.       . COLCRYS 0.6 MG tablet Take 0.6 mg by mouth daily.       . LIDOCAINE EX Apply 1 application topically See admin instructions. Apply to inject site on dialysis days (Tues, Thur, Sat) if needed for pain      . loperamide (IMODIUM A-D) 2 MG tablet Take 2 mg by mouth daily as needed. For diarrhea      . multivitamin (RENA-VIT) TABS tablet Take 1 tablet by mouth daily.  30 tablet  0  . omeprazole (PRILOSEC) 40 MG capsule Take 40 mg by mouth daily.       . oxyCODONE-acetaminophen (PERCOCET/ROXICET) 5-325 MG per tablet Take 1 tablet by mouth every 6 (six) hours as needed for pain. For pain      . predniSONE (DELTASONE) 5 MG tablet Take 5 mg by mouth daily.       Current Facility-Administered Medications on File Prior to Visit  Medication Dose Route Frequency Provider Last Rate Last Dose  . fentaNYL (SUBLIMAZE) injection   Intravenous PRN Adam Ryan Henn, MD   25 mcg at 05/05/12 1544  . midazolam (VERSED) 5 MG/5ML injection   Intravenous PRN Adam Ryan Henn, MD   2 mg at 05/05/12 1544     REVIEW OF SYSTEMS: Cardiovascular: Positive for chest pain, a lateral leg pain Pulmonary: No productive cough, asthma or wheezing. Neurologic: Positive for leg weakness Hematologic: No bleeding problems or clotting disorders. Musculoskeletal: No joint pain or joint swelling. Gastrointestinal: No blood in stool or hematemesis Genitourinary: No dysuria or hematuria. Psychiatric:: No history of major depression. Integumentary: No rashes or ulcers. Constitutional: No fever or chills.  PHYSICAL EXAMINATION: General: The patient appears their stated age.  Vital signs are BP 177/79  Pulse 80  Temp(Src) 97.5 F (36.4 C) (Oral)  Ht 5' 11" (1.803 m)  Wt 108 lb 3.2 oz (49.079 kg)  BMI 15.10 kg/m2  SpO2 100% HEENT:  No gross abnormalities Pulmonary: Respirations are non-labored Abdomen: Soft and non-tender  Musculoskeletal: There are no major deformities.   Neurologic:  No focal weakness or paresthesias are detected, Skin: Right great toe ulceration along the tendon, extending to the second toe.  Ischemic changes to the tip of the left great toe with surrounding erythema Psychiatric: The patient has normal affect. Cardiovascular: There is a regular rate and rhythm without significant murmur appreciated.  Palpable thrill within left thigh graft.  No palpable pedal pulses  Diagnostic Studies: ABI studies could not be performed   today secondary to calcified vessels.  The patient has monophasic waveforms bilaterally   Assessment:  Bilateral lower extremity ulcerations, secondary to arterial insufficiency Plan: The patient needs to have better imaging of his lower extremity disease.  I have scheduled an arteriogram for Tuesday, April 14.  L. plan on access in the right groin with bilateral lower extremity evaluation and intervention as indicated.  I am going to treat the patient with one week of Levaquin to see if some of the cellulitis resolves.  This may also help his discomfort.  I discussed 3 potential options after the angiogram.  #1 would be disease amenable to percutaneous stenting.  #2 would be disease amenable to surgical revascularization and #3 would be disease which cannot be treated.  I did discuss that this is a limb threatening situation.  We also discussed the possibility of needing to remove his left thigh graft.     V. Wells Brabham IV, M.D. [facility] Office: [phone] Pager:  [phone]   

## 2014-01-03 NOTE — Progress Notes (Addendum)
PRN labetalol given for hypertension; MD aware; NTG gtt started; ordered not to pull sheath until BP decreases; will continue to monitor closely and update as needed

## 2014-01-03 NOTE — Interval H&P Note (Signed)
History and Physical Interval Note:  01/03/2014 2:03 PM  [name]  has presented today for surgery, with the diagnosis of pvd with ulcer  The various methods of treatment have been discussed with the patient and family. After consideration of risks, benefits and other options for treatment, the patient has consented to  Procedure(s): ABDOMINAL AORTAGRAM (N/A) as a surgical intervention .  The patient's history has been reviewed, patient examined, no change in status, stable for surgery.  I have reviewed the patient's chart and labs.  Questions were answered to the patient's satisfaction.     Serafina Mitchell

## 2014-01-03 NOTE — Op Note (Signed)
Patient name: [name] MRN: [id number] DOB: 10/29/1960 Sex: male  01/03/2014 Pre-operative Diagnosis: Bilateral lower extremity ulcers Post-operative diagnosis:  Same Surgeon:  Serafina Mitchell Procedure Performed:  1.  ultrasound-guided access, right femoral artery  2.  abdominal aortogram  3.  bilateral lower extremity runoff  4.  atherectomy left popliteal artery   5.  angioplasty left popliteal artery     Indications:  The patient has end-stage renal disease on dialysis via a left thigh graft.  He presented to the office with bilateral ischemic ulcers.  He comes in for further evaluation and possible treatment  Procedure:  The patient was identified in the holding area and taken to room 8.  The patient was then placed supine on the table and prepped and draped in the usual sterile fashion.  A time out was called.  Ultrasound was used to evaluate the right common femoral artery.  It was patent .  A digital ultrasound image was acquired.  A micropuncture needle was used to access the right common femoral artery under ultrasound guidance.  An 018 wire was advanced without resistance and a micropuncture sheath was placed.  The 018 wire was removed and a benson wire was placed.  The micropuncture sheath was exchanged for a 5 french sheath.  An omniflush catheter was advanced over the wire to the level of L-1.  An abdominal angiogram was obtained.  Next, using the omniflush catheter and a benson wire, the aortic bifurcation was crossed and the catheter was placed into theleft superficial femoral artery and left runoff was obtained.  right runoff was performed via retrograde sheath injections.  Findings:   Aortogram:  No significant infrarenal aortic stenosis is identified.  Bilateral common and external iliac arteries are calcified but widely patent.  Right Lower Extremity:  The right common femoral artery is patent throughout it's course.  The profunda femoral artery is widely patent.  The  superficial femoral artery is heavily calcified without significant stenosis.  High grade, greater than 90% stenosis is identified within the popliteal artery behind the knee.  This area is heavily calcified.  The dominant runoff vessel is the anterior tibial artery, however all 3 tibial vessels are patent down to the mid leg.  A posterior tibial artery is identified down across the ankle.  Left Lower Extremity:  The left common femoral artery is patent.  There is a dialysis graft visualized with the anastomosis off of the common femoral artery.  The profunda femoral artery is patent throughout it's course the superficial femoral artery is heavily calcified but patent.  There is a high-grade stenosis approximately 70% at the level of the patella.,  Greater than 80% stenosis is identified within the popliteal artery behind the knee.  There is two-vessel runoff via the anterior tibial and posterior tibial artery down to the ankle.  There is severe for disease out onto the foot.  Intervention:  After the above images were acquired, the decision was made to proceed with intervention.  Over a Rosen wire in the superficial femoral artery, a 7 [place] Ansel 1 sheath was placed into the left superficial femoral artery.  The patient was given systemic heparinization.  I then was able to easily advance a 014 [place] core wire into the tibial arteries across the stenosis.  I used a 4 x 100 balloon as a support catheter and exchanged the [place] core wire out for a Viper wire.  I then used a CSI 2.0 classic device to perform  atherectomy within the above and below knee popliteal artery.  I performed 2 passes at low, 2 passes at medium, and 2 passes at high speeds.  I then post dilated using a 4 x 100 balloon for balloon angioplasty.  Followup imaging revealed improved results.  The area of stenosis were significantly improved there was residual stenosis approximately 20-30% within the popliteal artery behind the knee and  proximally.  I did not want to risk injury/dissection to the artery by upsizing the balloon, given the heavily calcified area.  Therefore I elected not to treat with a larger balloon.  The runoff down to the ankle was the same.  A collateral of the posterior tibial artery was not visualized and a completion run.  I did not feel like this needed to be addressed.  At this point, the sheath was withdrawn to the right external iliac artery.  The patient taken the holding area for sheath pull once his coagulation profile corrects.  Impression:  #1  successful atherectomy and angioplasty of the left popliteal artery using a CSI classic 2.0 device and a 4 mm balloon.  The patient is two-vessel runoff down to the ankle.  There is significant atherosclerotic disease out onto the foot.  #2  high grade, greater than 80% lesions within the right popliteal artery behind the knee.  This will be addressed at a later date.   Theotis Burrow, M.D. [facility] Office: [phone] Pager:  [phone]

## 2014-01-03 NOTE — Consult Note (Signed)
Cardiology Consult Note DETERDING,JAMES L, MD No ref. provider found  Reason for consult:  History of Present Illness (and review of medical records): [name] is a 53 y.o. male who presents for evaluation of LE ischemic ulcers.  He has no hx of prior MI or known CAD, however, has PVD, ESRD on HD, and HTN.  He had been having significant rest pain in LE and ulcers prompting further evaluation by vascular surgery. Today, he underwent atherectomy and angioplasty of left popliteal artery.  Post-operatively patient complained of chest pain and had elevated blood pressure.  He states his chest pain was right sided and 8/10.  He received NTG.  His SBP were also in the 200s.  He was started on NTG gtt.  He now states chest pain is relieved.  Previous diagnostic testing for coronary artery disease includes: echocardiogram and Reports stress test performed at [facility] for transplant evaluation.. Previous history of cardiac disease includes Cardiomyopathy Chest Pain Vascular Disease. Coronary artery disease risk factors include: hypertension, male gender and smoking/ tobacco exposure. Patient denies history of coronary artery disease, ischemic heart disease, previous M.I. and valvular disease.  Stress Echo @ [facility] 05/2013 STRESS ECG RESULTS -----------------------------------------------------------  ECG Results: Nondiagnostic due to inadequate heart rate  INTERPRETATION ---------------------------------------------------------------  Interpretation: Indeterminate.  Note: No ischemia seen at subtarget HR. (106BPM)  Full chest wall also performed.  Thickened MV chordae  Maximum workload of 4.60 METs was achieved during exercise.   Echo 04/26/2012: Study Conclusions - Left ventricle: Systolic function was mildly to moderately reduced. The estimated ejection fraction was in the range of 40% to 45%. Regional wall motion abnormalities cannot be excluded. Left ventricular diastolic function parameters  were normal. - Mitral valve: Mild regurgitation. - Pericardium, extracardiac: There was a right pleural effusion.  Review of Systems Further review of systems was otherwise negative other than stated in HPI.  Patient Active Problem List   Diagnosis Date Noted  . Lower extremity ulceration 01/03/2014  . Peripheral vascular disease, unspecified 12/26/2013  . 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 Post-operative state 04/18/2013  . Anal lesion 03/07/2013  . Rectal pain 10/28/2012  . 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 Gram-positive bacteremia 07/04/2012  . ESRD (end stage renal disease) on dialysis 07/01/2012  . Fever 07/01/2012  . Bleeding 05/13/2012  . Anemia 05/13/2012  . Wound dehiscence 05/10/2012  . Abscess of abdominal cavity 05/04/2012  . HTN (hypertension) 04/15/2012  . Chronic use of steroids 04/15/2012  . Malnutrition, calorie 04/15/2012  . Leg graft occlusion 04/15/2012  . Acute respiratory failure with hypoxia 04/15/2012  . Sinus tachycardia 04/15/2012  . Thrombocytopenia 04/14/2012  . Gout 04/14/2012  . SBO (small bowel obstruction) s/p EL/LOA and SBR x 2 04/03/2012  . CKD (chronic kidney disease) stage V requiring chronic dialysis 04/03/2012   Past Medical History  Diagnosis Date  . Prostate cancer   . Hypertension   . Renal transplant disorder 1997    Now HD   . Gout tophi   . Chronic steroid use     For Gout transplant. Has sever gout. Did not tolerate Allopurinol. Do not taper per PCP   . Systolic CHF, chronic     2 D echo 04/2012 with EF of 45 %   . Malnutrition   . 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 F/U with Dr Donne Hazel. Started on TNA  . Anemia associated with chronic renal failure   .  Hepatitis C   . Heart murmur   . ESRD needing dialysis     [facility]  . GERD (gastroesophageal reflux disease)   . H/O hiatal hernia   . Arthritis   .  DVT (deep venous thrombosis)   . Peripheral vascular disease     Past Surgical History  Procedure Laterality Date  . Laparotomy  04/14/2012    Procedure: EXPLORATORY LAPAROTOMY;  Surgeon: Joyice Faster. Cornett, MD;  Location: [place];  Service: General;  Laterality: N/A;  . Colon resection  04/14/2012  . Laparotomy  04/22/2012    Procedure: EXPLORATORY LAPAROTOMY;  Surgeon: Adin Hector, MD;  Location: [place];  Service: General;  Laterality: N/A;  lysis of adhesions, omentoplasty, repair small bowel  . Thrombectomy w/ embolectomy  04/27/2012    Procedure: THROMBECTOMY ARTERIOVENOUS GORE-TEX GRAFT;  Surgeon: Angelia Mould, MD;  Location: [place];  Service: Vascular;  Laterality: Left;  Thrombectomy of left thigh arteriovenous gortex graft  . Prostectomy  2011  . Renal grafts    . Pelvic abcess drainage Right 6/14    removal drain s/p bowl resection 13  . Transanal excision of rectal mass N/A 03/30/2013    Procedure: EXCISION OF anal MASS;  Surgeon: Joyice Faster. Cornett, MD;  Location: [place];  Service: General;  Laterality: N/A;  Exam under anesthesia with excision anal verge mass    Prescriptions prior to admission  Medication Sig Dispense Refill  . calcium acetate (PHOSLO) 667 MG capsule Take 1,334 mg by mouth as needed (with snacks).      . calcium acetate (PHOSLO) 667 MG tablet Take 2,001 mg by mouth 3 (three) times daily.       . cinacalcet (SENSIPAR) 60 MG tablet Take 60 mg by mouth daily.      Marland Kitchen COLCRYS 0.6 MG tablet Take 0.6 mg by mouth daily.       Marland Kitchen levofloxacin (LEVAQUIN) 250 MG tablet Take 1 tablet (250 mg total) by mouth daily.  10 tablet  0  . LIDOCAINE EX Apply 1 application topically See admin instructions. Apply to inject site on dialysis days (Tues, Thur, Sat) if needed for pain      . loperamide (IMODIUM A-D) 2 MG tablet Take 2 mg by mouth daily as needed. For diarrhea      . multivitamin (RENA-VIT) TABS tablet Take 1 tablet by mouth daily.  30 tablet  0  . omeprazole (PRILOSEC)  40 MG capsule Take 40 mg by mouth daily.       Marland Kitchen oxyCODONE-acetaminophen (PERCOCET/ROXICET) 5-325 MG per tablet Take 1-2 tablets by mouth every 6 (six) hours as needed for pain. For pain      . predniSONE (DELTASONE) 5 MG tablet Take 5 mg by mouth daily.       Allergies  Allergen Reactions  . Allopurinol Other (See Comments)    REACTION: decreased platelets  . Aspirin Other (See Comments)    REACTION: unknown reaction    History  Substance Use Topics  . Smoking status: Former Smoker -- 1.00 packs/day for 10 years    Types: Cigarettes    Quit date: 03/23/1973  . Smokeless tobacco: Not on file     Comment: Quit 30 years ago  . Alcohol Use: No    Family History  Problem Relation Age of Onset  . Hypertension Father   . Pneumonia Brother   . Lung disease Brother      Objective:  Patient Vitals for the past 8 hrs:  BP Temp Temp src Pulse Resp SpO2  01/03/14 1948 225/88 mmHg 98.1 F (36.7 C) Oral 92 17 97 %  01/03/14 1900 246/92 mmHg - - - 15 95 %  01/03/14 1845 249/97 mmHg - - - 15 99 %  01/03/14 1830 253/98 mmHg - - - 14 98 %  01/03/14 1829 252/102 mmHg - - - 14 98 %  01/03/14 1828 252/102 mmHg - - - 16 98 %  01/03/14 1616 201/93 mmHg - - - - -   General appearance: alert, cooperative, thin male in NAD Head: Normocephalic, without obvious abnormality, atraumatic Eyes: conjunctivae/corneas clear. PERRL, EOM's intact. Fundi benign. Neck: suple Lungs: clear to auscultation bilaterally Chest wall: no tenderness Heart: regular rate and rhythm, S1, S2 normal, Abdomen: soft, non-tender; bowel sounds normal;  Extremities:  no  edemaM Neurologic: Grossly normal  Results for orders placed during the hospital encounter of 01/03/14 (from the past 48 hour(s))  POCT I-STAT, CHEM 8     Status: Abnormal   Collection Time    01/03/14 10:46 AM      Result Value Ref Range   Sodium 143  137 - 147 mEq/L   Potassium 3.8  3.7 - 5.3 mEq/L   Chloride 105  96 - 112 mEq/L   BUN 52 (*) 6 -  23 mg/dL   Creatinine, Ser 12.40 (*) 0.50 - 1.35 mg/dL   Glucose, Bld 80  70 - 99 mg/dL   Calcium, Ion 1.09 (*) 1.12 - 1.23 mmol/L   TCO2 26  0 - 100 mmol/L   Hemoglobin 12.9 (*) 13.0 - 17.0 g/dL   HCT 38.0 (*) 39.0 - 52.0 %  POCT ACTIVATED CLOTTING TIME     Status: None   Collection Time    01/03/14  3:08 PM      Result Value Ref Range   Activated Clotting Time 204    POCT ACTIVATED CLOTTING TIME     Status: None   Collection Time    01/03/14  3:42 PM      Result Value Ref Range   Activated Clotting Time 227    POCT ACTIVATED CLOTTING TIME     Status: None   Collection Time    01/03/14  4:37 PM      Result Value Ref Range   Activated Clotting Time 204    TROPONIN I     Status: None   Collection Time    01/03/14  5:20 PM      Result Value Ref Range   Troponin I <0.30  <0.30 ng/mL   Comment:            Due to the release kinetics of cTnI,     a negative result within the first hours     of the onset of symptoms does not rule out     myocardial infarction with certainty.     If myocardial infarction is still suspected,     repeat the test at appropriate intervals.  POCT ACTIVATED CLOTTING TIME     Status: None   Collection Time    01/03/14  5:38 PM      Result Value Ref Range   Activated Clotting Time 188     No results found.  ECG:  HR 95 Sinus rhythm with premature atrial complexes, QTc 500, poor r wave progression, no significant change from prior.  Impression: Malignant Hypertension Chest pain PVD s/p atherectomy and angioplasty of left popliteal artery ESRD on HD Chronic Hep C  Recommendations: -  Continue close monitoring in telemetry -EKG prn with chest pain or arrythmia -Cycle cardiac enzymes x 3 -Will add Labetalol gtt for BP control, parameters given -Will re-evalaute and transition to oral meds when BP improved.  Thank you for this consult.  We will follow along with you and make further recommendations as indicated.

## 2014-01-04 DIAGNOSIS — D649 Anemia, unspecified: Secondary | ICD-10-CM

## 2014-01-04 DIAGNOSIS — N186 End stage renal disease: Secondary | ICD-10-CM

## 2014-01-04 DIAGNOSIS — L97909 Non-pressure chronic ulcer of unspecified part of unspecified lower leg with unspecified severity: Secondary | ICD-10-CM

## 2014-01-04 DIAGNOSIS — I10 Essential (primary) hypertension: Secondary | ICD-10-CM

## 2014-01-04 LAB — CBC
HCT: 33.9 % — ABNORMAL LOW (ref 39.0–52.0)
Hemoglobin: 11.4 g/dL — ABNORMAL LOW (ref 13.0–17.0)
MCH: 29.1 pg (ref 26.0–34.0)
MCHC: 33.6 g/dL (ref 30.0–36.0)
MCV: 86.5 fL (ref 78.0–100.0)
PLATELETS: 116 10*3/uL — AB (ref 150–400)
RBC: 3.92 MIL/uL — AB (ref 4.22–5.81)
RDW: 14.6 % (ref 11.5–15.5)
WBC: 7.7 10*3/uL (ref 4.0–10.5)

## 2014-01-04 LAB — BASIC METABOLIC PANEL
BUN: 67 mg/dL — ABNORMAL HIGH (ref 6–23)
CO2: 22 mEq/L (ref 19–32)
Calcium: 8.5 mg/dL (ref 8.4–10.5)
Chloride: 98 mEq/L (ref 96–112)
Creatinine, Ser: 12.51 mg/dL — ABNORMAL HIGH (ref 0.50–1.35)
GFR calc Af Amer: 5 mL/min — ABNORMAL LOW (ref >90)
GFR, EST NON AFRICAN AMERICAN: 4 mL/min — AB (ref >90)
Glucose, Bld: 102 mg/dL — ABNORMAL HIGH (ref 70–99)
Potassium: 4.6 mEq/L (ref 3.7–5.3)
SODIUM: 142 meq/L (ref 137–147)

## 2014-01-04 LAB — RENAL FUNCTION PANEL
ALBUMIN: 2.5 g/dL — AB (ref 3.5–5.2)
BUN: 72 mg/dL — ABNORMAL HIGH (ref 6–23)
CO2: 20 mEq/L (ref 19–32)
Calcium: 8.7 mg/dL (ref 8.4–10.5)
Chloride: 101 mEq/L (ref 96–112)
Creatinine, Ser: 13.58 mg/dL — ABNORMAL HIGH (ref 0.50–1.35)
GFR calc Af Amer: 4 mL/min — ABNORMAL LOW (ref >90)
GFR, EST NON AFRICAN AMERICAN: 4 mL/min — AB (ref >90)
Glucose, Bld: 117 mg/dL — ABNORMAL HIGH (ref 70–99)
POTASSIUM: 5 meq/L (ref 3.7–5.3)
Phosphorus: 5.8 mg/dL — ABNORMAL HIGH (ref 2.3–4.6)
Sodium: 142 mEq/L (ref 137–147)

## 2014-01-04 LAB — TROPONIN I

## 2014-01-04 LAB — LIPID PANEL
Cholesterol: 121 mg/dL (ref 0–200)
HDL: 68 mg/dL (ref >39)
LDL CALC: 26 mg/dL (ref 0–99)
Total CHOL/HDL Ratio: 1.8 RATIO
Triglycerides: 137 mg/dL (ref <150)
VLDL: 27 mg/dL (ref 0–40)

## 2014-01-04 LAB — POCT ACTIVATED CLOTTING TIME
ACTIVATED CLOTTING TIME: 127 s
Activated Clotting Time: 531 seconds

## 2014-01-04 MED ORDER — PENTAFLUOROPROP-TETRAFLUOROETH EX AERO: 1.0000 "application " | INHALATION_SPRAY | CUTANEOUS | Status: DC | PRN

## 2014-01-04 MED ORDER — ALTEPLASE 2 MG IJ SOLR
2.0000 mg | Freq: Once | INTRAMUSCULAR | Status: AC | PRN
  Filled 2014-01-04: qty 2

## 2014-01-04 MED ORDER — HEPARIN SODIUM (PORCINE) 1000 UNIT/ML DIALYSIS: 1000.0000 [IU] | INTRAMUSCULAR | Status: DC | PRN

## 2014-01-04 MED ORDER — SODIUM CHLORIDE 0.9 % IV SOLN: 100.0000 mL | INTRAVENOUS | Status: DC | PRN

## 2014-01-04 MED ORDER — LIDOCAINE-PRILOCAINE 2.5-2.5 % EX CREA
1.0000 "application " | TOPICAL_CREAM | CUTANEOUS | Status: DC | PRN
  Filled 2014-01-04: qty 5

## 2014-01-04 MED ORDER — MORPHINE SULFATE 2 MG/ML IJ SOLN
INTRAMUSCULAR | Status: AC
  Filled 2014-01-04: qty 1

## 2014-01-04 MED ORDER — NEPRO/CARBSTEADY PO LIQD: 237.0000 mL | ORAL | Status: DC | PRN

## 2014-01-04 MED ORDER — ATROPINE SULFATE 0.1 MG/ML IJ SOLN
INTRAMUSCULAR | Status: AC
  Filled 2014-01-04: qty 10

## 2014-01-04 MED ORDER — ALTEPLASE 2 MG IJ SOLR: 2.0000 mg | Freq: Once | INTRAMUSCULAR | Status: DC | PRN

## 2014-01-04 MED ORDER — LIDOCAINE HCL (PF) 1 % IJ SOLN: 5.0000 mL | INTRAMUSCULAR | Status: DC | PRN

## 2014-01-04 MED ORDER — MORPHINE SULFATE 2 MG/ML IJ SOLN
INTRAMUSCULAR | Status: AC
  Administered 2014-01-04: 2 mg via INTRAVENOUS
  Filled 2014-01-04: qty 1

## 2014-01-04 MED ORDER — AMLODIPINE BESYLATE 5 MG PO TABS
5.0000 mg | ORAL_TABLET | Freq: Every day | ORAL | Status: DC
  Administered 2014-01-04 – 2014-01-05 (×2): 5 mg via ORAL
  Filled 2014-01-04 (×2): qty 1

## 2014-01-04 MED ORDER — PRO-STAT SUGAR FREE PO LIQD
30.0000 mL | Freq: Two times a day (BID) | ORAL | Status: DC
  Administered 2014-01-04 – 2014-01-05 (×2): 30 mL via ORAL
  Filled 2014-01-04 (×3): qty 30

## 2014-01-04 MED ORDER — NEPRO/CARBSTEADY PO LIQD
237.0000 mL | ORAL | Status: DC | PRN
  Filled 2014-01-04: qty 237

## 2014-01-04 MED ORDER — LIDOCAINE-PRILOCAINE 2.5-2.5 % EX CREA: 1.0000 "application " | TOPICAL_CREAM | CUTANEOUS | Status: DC | PRN

## 2014-01-04 NOTE — Progress Notes (Signed)
INITIAL NUTRITION ASSESSMENT  DOCUMENTATION CODES Per approved criteria  -Severe malnutrition in the context of chronic illness   INTERVENTION: 1.  Supplements; Prostat BID, each supplement provides 100 kcal, 15g protein.  2.  General healthful diet; encourage intake of foods and beverages as able.  RD to follow and assess for nutritional adequacy.   NUTRITION DIAGNOSIS: Malnutrition related to chronic illness as evidenced by nutrition focused physical exam.   Monitor:  1.  Food/Beverage; pt meeting >/=90% estimated needs with tolerance. 2.  Wt/wt change; monitor trends  Reason for Assessment: MST  53 y.o. male  Admitting Dx: chest pain  ASSESSMENT: Pt admitted with chest pain after undergoing left popliteal artery angioplasty and arthrectomy yesterday.  Pt states that he has been eating well per his estimate.  He does admit to eating less than his usual due to foot pain.  He is unable to to give me a recall or typical intake.  He believes he has gained 10 lbs since 2013- states "I'm still trying to recover from all that happened then." RD reviewed nutrition needs with patient.  Stated additional supplements that can be used to supplement intake while in the hospital.  Pt would like Prostat.   Nutrition Focused Physical Exam: Subcutaneous Fat:  Orbital Region: moderate wasting Upper Arm Region: moderate wasting Thoracic and Lumbar Region: severe wasting  Muscle:  Temple Region: severe wasting Clavicle Bone Region: moderate-severe wasting Clavicle and Acromion Bone Region: severe wasting Scapular Bone Region: moderate wasting Dorsal Hand: moderate wasting Patellar Region: not assessed, pain Anterior Thigh Region: not assessed, pain Posterior Calf Region: not assessed, pain  Edema: none present  Pt meets criteria for severe MALNUTRITION in the context of chronic as evidenced by moderate-severely depleted muscle and fat mass.  Height: Ht Readings from Last 1 Encounters:   01/03/14 5\' 11"  (1.803 m)    Weight: Wt Readings from Last 1 Encounters:  01/03/14 110 lb (49.896 kg)    Ideal Body Weight: 78.1 lg  % Ideal Body Weight: 63.6%  Wt Readings from Last 10 Encounters:  01/03/14 110 lb (49.896 kg)  01/03/14 110 lb (49.896 kg)  12/26/13 108 lb 3.2 oz (49.079 kg)  09/02/13 106 lb 6.4 oz (48.263 kg)  07/11/13 109 lb (49.442 kg)  04/18/13 109 lb 6.4 oz (49.624 kg)  03/23/13 107 lb 11.2 oz (48.852 kg)  03/21/13 109 lb 6.4 oz (49.624 kg)  03/07/13 108 lb 3.2 oz (49.079 kg)  02/18/13 105 lb 12.8 oz (47.991 kg)    Usual Body Weight: 105-110 lbs  % Usual Body Weight: 100%  BMI:  Body mass index is 15.35 kg/(m^2).  Estimated Nutritional Needs: Kcal: 1500-1750 Protein: 75-90g Fluid: per MD  Skin: intact  Diet Order: Renal  EDUCATION NEEDS: -Education needs addressed   Intake/Output Summary (Last 24 hours) at 01/04/14 1128 Last data filed at 01/04/14 0600  Gross per 24 hour  Intake  152.5 ml  Output      0 ml  Net  152.5 ml    Last BM: 4/14  Labs:   Recent Labs Lab 01/03/14 1046 01/04/14 0100  NA 143 142  K 3.8 4.6  CL 105 98  CO2  --  22  BUN 52* 67*  CREATININE 12.40* 12.51*  CALCIUM  --  8.5  GLUCOSE 80 102*    CBG (last 3)  No results found for this basename: GLUCAP,  in the last 72 hours  Scheduled Meds: . amLODipine  5 mg Oral Daily  .  sodium chloride  3 mL Intravenous Q12H    Continuous Infusions: . labetalol (NORMODYNE) infusion 0.5 mg/min (01/03/14 2207)  . nitroGLYCERIN 10 mcg/min (01/04/14 0600)    Past Medical History  Diagnosis Date  . Prostate cancer   . Hypertension   . Renal transplant disorder 1997    Now HD   . Gout tophi   . Chronic steroid use     For Gout transplant. Has sever gout. Did not tolerate Allopurinol. Do not taper per PCP   . Systolic CHF, chronic     2 D echo 04/2012 with EF of 45 %   . Malnutrition   . 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 F/U with Dr Donne Hazel. Started on TNA  . Anemia associated with chronic renal failure   . Hepatitis C   . Heart murmur   . ESRD needing dialysis     [facility]  . GERD (gastroesophageal reflux disease)   . H/O hiatal hernia   . Arthritis   . DVT (deep venous thrombosis)   . Peripheral vascular disease     Past Surgical History  Procedure Laterality Date  . Laparotomy  04/14/2012    Procedure: EXPLORATORY LAPAROTOMY;  Surgeon: Joyice Faster. Cornett, MD;  Location: [place];  Service: General;  Laterality: N/A;  . Colon resection  04/14/2012  . Laparotomy  04/22/2012    Procedure: EXPLORATORY LAPAROTOMY;  Surgeon: Adin Hector, MD;  Location: [place];  Service: General;  Laterality: N/A;  lysis of adhesions, omentoplasty, repair small bowel  . Thrombectomy w/ embolectomy  04/27/2012    Procedure: THROMBECTOMY ARTERIOVENOUS GORE-TEX GRAFT;  Surgeon: Angelia Mould, MD;  Location: [place];  Service: Vascular;  Laterality: Left;  Thrombectomy of left thigh arteriovenous gortex graft  . Prostectomy  2011  . Renal grafts    . Pelvic abcess drainage Right 6/14    removal drain s/p bowl resection 13  . Transanal excision of rectal mass N/A 03/30/2013    Procedure: EXCISION OF anal MASS;  Surgeon: Joyice Faster. Cornett, MD;  Location: [place];  Service: General;  Laterality: N/A;  Exam under anesthesia with excision anal verge mass    Brynda Greathouse, MS RD LDN Clinical Inpatient Dietitian Pager: [phone] Weekend/After hours pager: [phone]

## 2014-01-04 NOTE — Consult Note (Signed)
[facility] Renal Consultation Note  Indication for Consultation:  Management of ESRD/hemodialysis; anemia, hypertension/volume and secondary hyperparathyroidism  HPI: [name] is a 53 y.o. AA  male with a history of hypertension, PVD, and ESRD on dialysis on TTS at the [facility] who presented yesterday for outpatient lower extremity angiogram for evaluation of bilateral toe ulcers and post-procedure had chest pain, requiring evaluation by Cardiology and overnight hospital stay.  The chest pain was on the right side with an elevated systolic blood pressure > 200, but he improved with nitroglycerin and has had no further complaints.  His blood pressure was as high as 253/98 last night, but improved with a Labetalol drip.  He states that in the past he has had similar right-side chest pain which resolved without intervention.  During yesterday's angiogram by Dr. Trula Slade of [facility] he had atherectomy and angioplasty of the left popliteal artery to improve left lower extremity ischemic ulcers, but will require percutaneous revascularization of his right leg next week.  He did not receive dialysis yesterday per outpatient schedule, so he will get his treatment in the hospital today.   Dialysis Orders:   TTS @ [facility] 3:30       48 kg        2K/2Ca       400/800       Heparin 4000 U        L thigh AVG  Calcitriol 0.75 mcg on MWF        No Epogen or Venofer  Past Medical History  Diagnosis Date  . Prostate cancer   . Hypertension   . Renal transplant disorder 1997    Now HD   . Gout tophi   . Chronic steroid use     For Gout transplant. Has sever gout. Did not tolerate Allopurinol. Do not taper per PCP   . Systolic CHF, chronic     2 D echo 04/2012 with EF of 45 %   . Malnutrition   . 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 F/U with Dr Donne Hazel. Started on TNA  . Anemia  associated with chronic renal failure   . Hepatitis C   . Heart murmur   . ESRD needing dialysis     [facility],sat  . GERD (gastroesophageal reflux disease)   . H/O hiatal hernia   . Arthritis   . DVT (deep venous thrombosis)   . Peripheral vascular disease    Past Surgical History  Procedure Laterality Date  . Laparotomy  04/14/2012    Procedure: EXPLORATORY LAPAROTOMY;  Surgeon: Joyice Faster. Cornett, MD;  Location: [place];  Service: General;  Laterality: N/A;  . Colon resection  04/14/2012  . Laparotomy  04/22/2012    Procedure: EXPLORATORY LAPAROTOMY;  Surgeon: Adin Hector, MD;  Location: [place];  Service: General;  Laterality: N/A;  lysis of adhesions, omentoplasty, repair small bowel  . Thrombectomy w/ embolectomy  04/27/2012    Procedure: THROMBECTOMY ARTERIOVENOUS GORE-TEX GRAFT;  Surgeon: Angelia Mould, MD;  Location: [place];  Service: Vascular;  Laterality: Left;  Thrombectomy of left thigh arteriovenous gortex graft  . Prostectomy  2011  . Renal grafts    . Pelvic abcess drainage Right 6/14    removal drain s/p bowl resection 13  . Transanal excision of rectal mass N/A 03/30/2013    Procedure: EXCISION OF anal MASS;  Surgeon:  Thomas A. Cornett, MD;  Location: [place];  Service: General;  Laterality: N/A;  Exam under anesthesia with excision anal verge mass   Family History  Problem Relation Age of Onset  . Hypertension Father   . Pneumonia Brother   . Lung disease Brother    Social History  He quit smoking cigarettes and drinking alcohol in his 108s and denies any illicit drug use.  Allergies  Allergen Reactions  . Allopurinol Other (See Comments)    REACTION: decreased platelets  . Aspirin Other (See Comments)    REACTION: unknown reaction   Prior to Admission medications   Medication Sig Start Date End Date Taking? Authorizing Provider  calcium acetate (PHOSLO) 667 MG capsule Take 1,334 mg by mouth as needed (with snacks).   Yes Historical Provider, MD   calcium acetate (PHOSLO) 667 MG tablet Take 2,001 mg by mouth 3 (three) times daily.  01/24/13  Yes Historical Provider, MD  cinacalcet (SENSIPAR) 60 MG tablet Take 60 mg by mouth daily.   Yes Historical Provider, MD  COLCRYS 0.6 MG tablet Take 0.6 mg by mouth daily.  01/04/13  Yes Historical Provider, MD  levofloxacin (LEVAQUIN) 250 MG tablet Take 1 tablet (250 mg total) by mouth daily. 12/26/13  Yes Serafina Mitchell, MD  LIDOCAINE EX Apply 1 application topically See admin instructions. Apply to inject site on dialysis days (Tues, Thur, Sat) if needed for pain   Yes Historical Provider, MD  loperamide (IMODIUM A-D) 2 MG tablet Take 2 mg by mouth daily as needed. For diarrhea 08/13/12  Yes Thomas A. Cornett, MD  multivitamin (RENA-VIT) TABS tablet Take 1 tablet by mouth daily. 07/05/12  Yes Rosalia Hammers, MD  omeprazole (PRILOSEC) 40 MG capsule Take 40 mg by mouth daily.  02/16/13  Yes Historical Provider, MD  oxyCODONE-acetaminophen (PERCOCET/ROXICET) 5-325 MG per tablet Take 1-2 tablets by mouth every 6 (six) hours as needed for pain. For pain 07/05/12  Yes Rosalia Hammers, MD  predniSONE (DELTASONE) 5 MG tablet Take 5 mg by mouth daily.   Yes Historical Provider, MD   Labs:  Results for orders placed during the hospital encounter of 01/03/14 (from the past 48 hour(s))  POCT I-STAT, CHEM 8     Status: Abnormal   Collection Time    01/03/14 10:46 AM      Result Value Ref Range   Sodium 143  137 - 147 mEq/L   Potassium 3.8  3.7 - 5.3 mEq/L   Chloride 105  96 - 112 mEq/L   BUN 52 (*) 6 - 23 mg/dL   Creatinine, Ser 12.40 (*) 0.50 - 1.35 mg/dL   Glucose, Bld 80  70 - 99 mg/dL   Calcium, Ion 1.09 (*) 1.12 - 1.23 mmol/L   TCO2 26  0 - 100 mmol/L   Hemoglobin 12.9 (*) 13.0 - 17.0 g/dL   HCT 38.0 (*) 39.0 - 52.0 %  POCT ACTIVATED CLOTTING TIME     Status: None   Collection Time    01/03/14  3:08 PM      Result Value Ref Range   Activated Clotting Time 204    POCT ACTIVATED CLOTTING TIME      Status: None   Collection Time    01/03/14  3:42 PM      Result Value Ref Range   Activated Clotting Time 227    POCT ACTIVATED CLOTTING TIME     Status: None   Collection Time    01/03/14  4:37 PM  Result Value Ref Range   Activated Clotting Time 204    TROPONIN I     Status: None   Collection Time    01/03/14  5:20 PM      Result Value Ref Range   Troponin I <0.30  <0.30 ng/mL   Comment:            Due to the release kinetics of cTnI,     a negative result within the first hours     of the onset of symptoms does not rule out     myocardial infarction with certainty.     If myocardial infarction is still suspected,     repeat the test at appropriate intervals.  POCT ACTIVATED CLOTTING TIME     Status: None   Collection Time    01/03/14  5:38 PM      Result Value Ref Range   Activated Clotting Time 188    POCT ACTIVATED CLOTTING TIME     Status: None   Collection Time    01/03/14  6:32 PM      Result Value Ref Range   Activated Clotting Time 531    MRSA PCR SCREENING     Status: None   Collection Time    01/03/14  6:55 PM      Result Value Ref Range   MRSA by PCR NEGATIVE  NEGATIVE   Comment:            The GeneXpert MRSA Assay (FDA     approved for NASAL specimens     only), is one component of a     comprehensive MRSA colonization     surveillance program. It is not     intended to diagnose MRSA     infection nor to guide or     monitor treatment for     MRSA infections.  TROPONIN I     Status: None   Collection Time    01/03/14 10:20 PM      Result Value Ref Range   Troponin I <0.30  <0.30 ng/mL   Comment:            Due to the release kinetics of cTnI,     a negative result within the first hours     of the onset of symptoms does not rule out     myocardial infarction with certainty.     If myocardial infarction is still suspected,     repeat the test at appropriate intervals.  POCT ACTIVATED CLOTTING TIME     Status: None   Collection Time     01/04/14 12:31 AM      Result Value Ref Range   Activated Clotting Time 354    BASIC METABOLIC PANEL     Status: Abnormal   Collection Time    01/04/14  1:00 AM      Result Value Ref Range   Sodium 142  137 - 147 mEq/L   Potassium 4.6  3.7 - 5.3 mEq/L   Comment: DELTA CHECK NOTED   Chloride 98  96 - 112 mEq/L   CO2 22  19 - 32 mEq/L   Glucose, Bld 102 (*) 70 - 99 mg/dL   BUN 67 (*) 6 - 23 mg/dL   Creatinine, Ser 12.51 (*) 0.50 - 1.35 mg/dL   Calcium 8.5  8.4 - 10.5 mg/dL   GFR calc non Af Amer 4 (*) >90 mL/min   GFR calc Af Amer 5 (*) >90 mL/min  Comment: (NOTE)     The eGFR has been calculated using the CKD EPI equation.     This calculation has not been validated in all clinical situations.     eGFR's persistently <90 mL/min signify possible Chronic Kidney     Disease.  TROPONIN I     Status: None   Collection Time    01/04/14  2:00 AM      Result Value Ref Range   Troponin I <0.30  <0.30 ng/mL   Comment:            Due to the release kinetics of cTnI,     a negative result within the first hours     of the onset of symptoms does not rule out     myocardial infarction with certainty.     If myocardial infarction is still suspected,     repeat the test at appropriate intervals.   Constitutional: negative for chills, fatigue, fevers and sweats Ears, nose, mouth, throat, and face: negative for earaches, hoarseness, nasal congestion and sore throat Respiratory: negative for cough, dyspnea on exertion, hemoptysis and sputum Cardiovascular: negative for chest pain, chest pressure/discomfort, dyspnea, orthopnea and palpitations Gastrointestinal: negative for abdominal pain, change in bowel habits, nausea and vomiting Genitourinary:negative, anuric Musculoskeletal:negative for arthralgias, back pain, myalgias and neck pain Neurological: negative for dizziness, headaches, paresthesia, speech problems and weakness  Physical Exam: Filed Vitals:   01/04/14 0500  BP: 124/70   Pulse:   Temp:   Resp: 14     General appearance: alert, cooperative and no distress Head: Normocephalic, without obvious abnormality, atraumatic Neck: no adenopathy, no carotid bruit, no JVD and supple, symmetrical, trachea midline Resp: clear to auscultation bilaterally Cardio: regular rate and rhythm, S1, S2 normal, no murmur, click, rub or gallop GI: soft, non-tender; bowel sounds normal; no masses,  no organomegaly Extremities: no edema, atraumatic, ischemic changes at R 1st & 2nd toes and L 1st toe Neurologic: Grossly normal Dialysis Access: L thigh AVG with + bruit   Assessment/Plan: 1. LE ischemic ulcers - LE angiogram with atherectomy & angioplasty of L popliteal artery yesterday per Dr. Trula Slade, PCI of R leg next week. 2. Chest pain - R side, resolved on NTG, followed by Cardiology; troponins x 3 negative.  EKG penidng. 3. ESRD - HD on TTS @ [facility], last HD 4/11, K 4.6.  HD pending today. 4. Hypertension/volume - BP up post-procedure, 253/98 last night, 124/70 now on Labetalol drip; no outpatient meds, starting Amlodipine 5 mg qd; recently lowered EDW sec to elevated BPs pre-HD.  Challenge another 1/2 today.  5. Anemia - Hgb 11.4, no Epogen or Venofer. 6. Metabolic bone disease - Ca 8.5, last P 4.6, iPTH 381; Calcitriol 0.75 mcg per HD, Sensipar 60 mg qd, Phoslo 4 with meals, 2 with snacks. 7. Nutrition - last Alb 3.4, renal diet, multivitamin. 8. Hx SBO - s/p EL/LOA & SBR x 2 @ [facility] 7/12-9/13/13.  Ramiro Harvest 01/04/2014, 10:52 AM   Attending Nephrologist: Fleet Contras, MD I have seen and examined this patient and agree with plan per Ramiro Harvest.  52yo admitted following vascular procedure due to CP he described as pressure.  Has not been having this pain at home.  BP has been higher at the outpt unit and DW being lowered.  BP high yest but SBP 128 now off all gtts.  Will plan HD today and lower DW a little more.  Will plan HD again in AM to get back on schedule.Marland Kitchen  Alphonse Guild Devonte Migues,MD 01/04/2014 1:40 PM

## 2014-01-04 NOTE — Progress Notes (Signed)
[facility] dialysis T-Th-Sat. [name]

## 2014-01-04 NOTE — Progress Notes (Signed)
   Subjective:  No further chest pain  Objective:   Vital Signs in the last 24 hours: Temp:  [97.6 F (36.4 C)-98.5 F (36.9 C)] 98.2 F (36.8 C) (04/15 0445) Pulse Rate:  [71-92] 73 (04/15 0445) Resp:  [11-20] 14 (04/15 0500) BP: (115-253)/(51-102) 124/70 mmHg (04/15 0500) SpO2:  [90 %-100 %] 94 % (04/15 0500) Arterial Line BP: (135-216)/(67-89) 135/67 mmHg (04/15 0000) Weight:  [110 lb (49.896 kg)] 110 lb (49.896 kg) (04/14 1025)  Intake/Output from previous day: 04/14 0701 - 04/15 0700 In: 152.5 [I.V.:152.5] Out: -   Medications: . sodium chloride  3 mL Intravenous Q12H    . labetalol (NORMODYNE) infusion 0.5 mg/min (01/03/14 2207)  . nitroGLYCERIN 10 mcg/min (01/04/14 0600)    Physical Exam:   General appearance: alert, cooperative and no distress Neck: no adenopathy, no carotid bruit, supple, symmetrical, trachea midline and thyroid not enlarged, symmetric, no tenderness/mass/nodules Lungs: clear to auscultation bilaterally Heart: regular rate and rhythm and 1-2/6 sem; s4; no s3 Abdomen: soft, non-tender; bowel sounds normal; no masses,  no organomegaly Extremities: extremities normal, atraumatic, no cyanosis or edema; warm to touch. Skin: Skin color, texture, turgor normal. No rashes or lesions Neurologic: Grossly normal   Rate: 72  Rhythm: normal sinus rhythm  Lab Results:     Recent Labs  01/03/14 1046 01/04/14 0100  NA 143 142  K 3.8 4.6  CL 105 98  CO2  --  22  GLUCOSE 80 102*  BUN 52* 67*  CREATININE 12.40* 12.51*   CBC    Component Value Date/Time   WBC 5.3 03/23/2013 1136   RBC 4.80 03/23/2013 1136   HGB 12.9* 01/03/2014 1046   HCT 38.0* 01/03/2014 1046   PLT 102* 03/23/2013 1136   MCV 88.5 03/23/2013 1136   MCH 29.6 03/23/2013 1136   MCHC 33.4 03/23/2013 1136   RDW 13.8 03/23/2013 1136   LYMPHSABS 1.1 03/23/2013 1136   MONOABS 0.8 03/23/2013 1136   EOSABS 0.1 03/23/2013 1136   BASOSABS 0.0 03/23/2013 1136     Recent Labs  01/03/14 2220  01/04/14 0200  TROPONINI <0.30 <0.30   Hepatic Function Panel No results found for this basename: PROT, ALBUMIN, AST, ALT, ALKPHOS, BILITOT, BILIDIR, IBILI,  in the last 72 hours No results found for this basename: INR,  in the last 72 hours BNP (last 3 results) No results found for this basename: PROBNP,  in the last 8760 hours  Lipid Panel     Component Value Date/Time   CHOL 173 05/31/2012 0650   TRIG 125 05/31/2012 0650      Imaging:  No results found.    Assessment/Plan:   Active Problems:   CKD (chronic kidney disease) stage V requiring chronic dialysis   Lower extremity ulceration   Essential hypertension, malignant   Chest pain has resolved. Malignant hypertension last night to 253/98; apparently was titrated up to 45 mcg of NTG and received labetalol bolus and drip. Now off both with BP 148/68. Trop neg x 3. Will check ECG today. I  have notified renal service. His last dialysis was on Saturday and he will need dialysis today.He has a h/o remote kidney transplant in 1987 and resumed dialysis in 2004. He is s/p prostatectomy for prostate Ca. Will start amlodipine at 5mg . Check lipid panel this am. Check echo today.   Troy Sine, MD, [facility] 01/04/2014, 9:08 AM

## 2014-01-04 NOTE — Progress Notes (Signed)
Utilization Review Completed.Neoma Laming T Dowell4/15/2015

## 2014-01-04 NOTE — Progress Notes (Signed)
  [name] Progress Note   Patient seen on dialysis. 2K 2.5Ca AP -150 VP 230  UF 2.5L through left femoral loop graft.  BP 128/75 with no dizziness or dyspnea or cp.  Only complains of left leg pain; he had an arteriogram with plasty of the left leg (lesion on great toe on the left side).  Good DP on the left leg.  Stephen Santee, MD 01/04/2014, 9:06 PM Patient was seen on dialysis and the procedure was supervised. Patient appears to be tolerating treatment well  Labs: BMET  Recent Labs Lab 01/03/14 1046 01/04/14 0100 01/04/14 1100  NA 143 142 142  K 3.8 4.6 5.0  CL 105 98 101  CO2  --  22 20  GLUCOSE 80 102* 117*  BUN 52* 67* 72*  CREATININE 12.40* 12.51* 13.58*  CALCIUM  --  8.5 8.7  PHOS  --   --  5.8*   CBC  Recent Labs Lab 01/03/14 1046 01/04/14 1105  WBC  --  7.7  HGB 12.9* 11.4*  HCT 38.0* 33.9*  MCV  --  86.5  PLT  --  116*    @IMGRELPRIORS @ Medications:    . amLODipine  5 mg Oral Daily  . feeding supplement (PRO-STAT SUGAR FREE 64)  30 mL Oral BID  . morphine      . sodium chloride  3 mL Intravenous Q12H

## 2014-01-04 NOTE — Progress Notes (Signed)
    Subjective  - POD #1  The patient underwent angioplasty and atherectomy of his left popliteal artery yesterday.  Postprocedure, he developed 8/10 chest pain.  He was admitted for observation.  He required a nitroglycerin drip overnight.  He states that his chest pain has improved today.  Nitroglycerin drip has been discontinued.  He also complains of pain in his feet which is unchanged.   Physical Exam:  Cardiovascular: Regular in rhythm Pulmonary: Respirations are nonlabored Extremities: Unchanged with bilateral ischemic ulcers. Cannulation site in the right groin is soft.       Assessment/Plan:  POD #1  The patient's chest pain is much improved.  Nitroglycerin continuous infusion has been discontinued.  He still complains of pain in both feet.  I appreciate cardiology for assistance.  The plan is for repeat echo today.  The patient last underwent dialysis on Saturday.  I last nephrology to have him dialyzed while he is in the hospital.  He will need percutaneous revascularization of his right leg.  This has been scheduled for next week  Wells Gl[place] Score 01/04/2014 10:52 AM --  Danley Danker Vitals:   01/04/14 0500  BP: 124/70  Pulse:   Temp:   Resp: 14    Intake/Output Summary (Last 24 hours) at 01/04/14 1052 Last data filed at 01/04/14 0600  Gross per 24 hour  Intake  152.5 ml  Output      0 ml  Net  152.5 ml     Laboratory CBC    Component Value Date/Time   WBC 5.3 03/23/2013 1136   HGB 12.9* 01/03/2014 1046   HCT 38.0* 01/03/2014 1046   PLT 102* 03/23/2013 1136    BMET    Component Value Date/Time   NA 142 01/04/2014 0100   K 4.6 01/04/2014 0100   CL 98 01/04/2014 0100   CO2 22 01/04/2014 0100   GLUCOSE 102* 01/04/2014 0100   BUN 67* 01/04/2014 0100   CREATININE 12.51* 01/04/2014 0100   CALCIUM 8.5 01/04/2014 0100   GFRNONAA 4* 01/04/2014 0100   GFRAA 5* 01/04/2014 0100    COAG Lab Results  Component Value Date   INR 1.18 06/03/2012   INR 1.17 05/13/2012   INR 1.15 04/30/2012   No results found for this basename: PTT    Antibiotics Anti-infectives   None       V. Leia Alf, M.D. [facility] Office: [phone] Pager:  [phone]

## 2014-01-05 ENCOUNTER — Encounter (HOSPITAL_COMMUNITY): Payer: Self-pay

## 2014-01-05 DIAGNOSIS — I369 Nonrheumatic tricuspid valve disorder, unspecified: Secondary | ICD-10-CM

## 2014-01-05 DIAGNOSIS — E43 Unspecified severe protein-calorie malnutrition: Secondary | ICD-10-CM | POA: Insufficient documentation

## 2014-01-05 DIAGNOSIS — I10 Essential (primary) hypertension: Secondary | ICD-10-CM

## 2014-01-05 LAB — CBC
HCT: 37 % — ABNORMAL LOW (ref 39.0–52.0)
HEMOGLOBIN: 12.2 g/dL — AB (ref 13.0–17.0)
MCH: 28.5 pg (ref 26.0–34.0)
MCHC: 33 g/dL (ref 30.0–36.0)
MCV: 86.4 fL (ref 78.0–100.0)
PLATELETS: 124 10*3/uL — AB (ref 150–400)
RBC: 4.28 MIL/uL (ref 4.22–5.81)
RDW: 14.6 % (ref 11.5–15.5)
WBC: 8.5 10*3/uL (ref 4.0–10.5)

## 2014-01-05 LAB — RENAL FUNCTION PANEL
Albumin: 2.8 g/dL — ABNORMAL LOW (ref 3.5–5.2)
BUN: 25 mg/dL — ABNORMAL HIGH (ref 6–23)
CHLORIDE: 95 meq/L — AB (ref 96–112)
CO2: 25 meq/L (ref 19–32)
Calcium: 8.6 mg/dL (ref 8.4–10.5)
Creatinine, Ser: 6.48 mg/dL — ABNORMAL HIGH (ref 0.50–1.35)
GFR, EST AFRICAN AMERICAN: 10 mL/min — AB (ref >90)
GFR, EST NON AFRICAN AMERICAN: 9 mL/min — AB (ref >90)
GLUCOSE: 74 mg/dL (ref 70–99)
POTASSIUM: 3.7 meq/L (ref 3.7–5.3)
Phosphorus: 3.4 mg/dL (ref 2.3–4.6)
Sodium: 137 mEq/L (ref 137–147)

## 2014-01-05 LAB — GLUCOSE, CAPILLARY: GLUCOSE-CAPILLARY: 78 mg/dL (ref 70–99)

## 2014-01-05 MED ORDER — AMLODIPINE BESYLATE 5 MG PO TABS: 5.0000 mg | ORAL_TABLET | Freq: Every day | ORAL | Status: DC

## 2014-01-05 MED ORDER — METOPROLOL TARTRATE 12.5 MG HALF TABLET
12.5000 mg | ORAL_TABLET | Freq: Two times a day (BID) | ORAL | Status: DC
  Filled 2014-01-05 (×2): qty 1

## 2014-01-05 MED ORDER — METOPROLOL TARTRATE 12.5 MG HALF TABLET: 12.5000 mg | ORAL_TABLET | Freq: Two times a day (BID) | ORAL | Status: DC

## 2014-01-05 MED ORDER — OXYCODONE-ACETAMINOPHEN 5-325 MG PO TABS: 1.0000 | ORAL_TABLET | Freq: Four times a day (QID) | ORAL | Status: DC | PRN

## 2014-01-05 NOTE — Progress Notes (Signed)
Pt arrived to room 15 via bed. Pt NSR on telemetry. Multiple ulcers to feet bilaterally. Left Great toe blackened at tip. Pulses weak but palpable bilaterally. Right groin dressing intact. No bleeding or hematoma noted. Left thigh fistula with dressing intact. Pt denies need for pain medication at this time.

## 2014-01-05 NOTE — Progress Notes (Signed)
Subjective:  BP much better; no chest pain or sob;  Objective:   Vital Signs in the last 24 hours: Temp:  [97.1 F (36.2 C)-98.5 F (36.9 C)] 98.5 F (36.9 C) (04/16 1125) Pulse Rate:  [68-122] 75 (04/16 0800) Resp:  [11-23] 13 (04/16 1200) BP: (99-175)/(33-109) 141/57 mmHg (04/16 1200) SpO2:  [92 %-98 %] 98 % (04/16 1125) Weight:  [109 lb 5.6 oz (49.6 kg)-113 lb 15.7 oz (51.7 kg)] 109 lb 5.6 oz (49.6 kg) (04/15 2223)  Intake/Output from previous day: 04/15 0701 - 04/16 0700 In: 847.5 [P.O.:840; I.V.:7.5] Out: 2003   Medications: . amLODipine  5 mg Oral Daily  . feeding supplement (PRO-STAT SUGAR FREE 64)  30 mL Oral BID  . sodium chloride  3 mL Intravenous Q12H    . labetalol (NORMODYNE) infusion Stopped (01/04/14 0700)  . nitroGLYCERIN Stopped (01/04/14 0800)    Physical Exam:   General appearance: alert, cooperative and no distress Neck: no JVD, supple, symmetrical, trachea midline and thyroid not enlarged, symmetric, no tenderness/mass/nodules Lungs: clear to auscultation bilaterally Heart: regular rate and rhythm, S4 present and 1/6 sem Abdomen: soft, non-tender; bowel sounds normal; no masses,  no organomegaly Extremities: no edema Neurologic: Grossly normal   Rate: 85  Rhythm: normal sinus rhythm  Lab Results:    Recent Labs  01/04/14 1100 01/05/14 0241  NA 142 137  K 5.0 3.7  CL 101 95*  CO2 20 25  GLUCOSE 117* 74  BUN 72* 25*  CREATININE 13.58* 6.48*   CBC    Component Value Date/Time   WBC 8.5 01/05/2014 0241   RBC 4.28 01/05/2014 0241   HGB 12.2* 01/05/2014 0241   HCT 37.0* 01/05/2014 0241   PLT 124* 01/05/2014 0241   MCV 86.4 01/05/2014 0241   MCH 28.5 01/05/2014 0241   MCHC 33.0 01/05/2014 0241   RDW 14.6 01/05/2014 0241   LYMPHSABS 1.1 03/23/2013 1136   MONOABS 0.8 03/23/2013 1136   EOSABS 0.1 03/23/2013 1136   BASOSABS 0.0 03/23/2013 1136     Recent Labs  01/03/14 2220 01/04/14 0200  TROPONINI <0.30 <0.30   Hepatic Function  Panel  Recent Labs  01/05/14 0241  ALBUMIN 2.8*   No results found for this basename: INR,  in the last 72 hours BNP (last 3 results) No results found for this basename: PROBNP,  in the last 8760 hours  Lipid Panel     Component Value Date/Time   CHOL 121 01/04/2014 1105   TRIG 137 01/04/2014 1105   HDL 68 01/04/2014 1105   CHOLHDL 1.8 01/04/2014 1105   VLDL 27 01/04/2014 1105   LDLCALC 26 01/04/2014 1105      Imaging: Echo Study Conclusions  Left ventricle: The cavity size was normal. Wall thickness was increased in a pattern of mild LVH. Systolic function was normal. The estimated ejection fraction was in the range of 55% to 60%. Wall motion was normal; there were no regional wall motion abnormalities. Features are consistent with a pseudonormal left ventricular filling pattern, with concomitant abnormal relaxation and increased filling pressure (grade 2 diastolic dysfunction). Doppler parameters are consistent with high ventricular filling pressure.    Assessment/Plan:   Active Problems:   CKD (chronic kidney disease) stage V requiring chronic dialysis   Lower extremity ulceration   Essential hypertension, malignant   Dyy 2 s/p angioplasty and atherectomy of his left popliteal artery. Dialysis last night and again today. BP markedly improved. Tolerating amlodipine. Trop negative. NL LV fxn with Gr  2 diastolic dysfunction. Off labetelol. Will start very low dose cardioselective BB with Lopressor 12.5 mg bid. For dialysis later today. OK from cardiac standpoint to transfer to telemetry floor.    Stephen Sine, MD, [facility] 01/05/2014, 12:51 PM

## 2014-01-05 NOTE — Progress Notes (Signed)
Discharge papers completed by Attending MD while patient in dialysis with instructions to discharge once patient returned to [facility] from HD if no complications during dialysis. Patient tolerated dialysis well with no adverse events reported. Vitals stable upon return. Patient denies complaints. Assessment benign. RN will complete discharge process when patients sister arrives to transport patient home.

## 2014-01-05 NOTE — Progress Notes (Addendum)
[facility]  Subjective  - Doing ok no more chest pain complaints.   Objective 168/82 75 98.3 F (36.8 C) (Oral) 14 97%  Intake/Output Summary (Last 24 hours) at 01/05/14 1019 Last data filed at 01/05/14 0755  Gross per 24 hour  Intake    366 ml  Output   2003 ml  Net  -1637 ml   Right groin soft, no hematoma Unchanged ischemic changes to both feet.   Assessment/Planning: POD #2 underwent angioplasty and atherectomy of his left popliteal artery yesterday.  Pending 2D echo results He will go to dialysis again today Troponin studies x 3 negative NSR CKD  Plan: high grade, greater than 80% lesions within the right popliteal artery behind the knee. This will be addressed at a later date.  Discharge on amlodipine and metoprolol.  F/U with Cardiology.  Ulyses Amor 01/05/2014 10:19 AM --  Laboratory Lab Results:  Recent Labs  01/04/14 1105 01/05/14 0241  WBC 7.7 8.5  HGB 11.4* 12.2*  HCT 33.9* 37.0*  PLT 116* 124*   BMET  Recent Labs  01/04/14 1100 01/05/14 0241  NA 142 137  K 5.0 3.7  CL 101 95*  CO2 20 25  GLUCOSE 117* 74  BUN 72* 25*  CREATININE 13.58* 6.48*  CALCIUM 8.7 8.6    COAG Lab Results  Component Value Date   INR 1.18 06/03/2012   INR 1.17 05/13/2012   INR 1.15 04/30/2012   No results found for this basename: PTT

## 2014-01-05 NOTE — Progress Notes (Signed)
S: No CP O:BP 155/47  Pulse 78  Temp(Src) 98.3 F (36.8 C) (Oral)  Resp 12  Ht 5\' 11"  (1.803 m)  Wt 49.6 kg (109 lb 5.6 oz)  BMI 15.26 kg/m2  SpO2 96%  Intake/Output Summary (Last 24 hours) at 01/05/14 0911 Last data filed at 01/05/14 0755  Gross per 24 hour  Intake    492 ml  Output   2003 ml  Net  -1511 ml   Weight change: 1.804 kg (3 lb 15.7 oz) WIO:XBDZH and alert CVS:RRR Resp:clear Abd:+ BS NTND Ext:no edema Lt thigh AVG + bruit NEURO:CNI OX3 no asterixis   . amLODipine  5 mg Oral Daily  . feeding supplement (PRO-STAT SUGAR FREE 64)  30 mL Oral BID  . sodium chloride  3 mL Intravenous Q12H   No results found. BMET    Component Value Date/Time   NA 137 01/05/2014 0241   K 3.7 01/05/2014 0241   CL 95* 01/05/2014 0241   CO2 25 01/05/2014 0241   GLUCOSE 74 01/05/2014 0241   BUN 25* 01/05/2014 0241   CREATININE 6.48* 01/05/2014 0241   CALCIUM 8.6 01/05/2014 0241   GFRNONAA 9* 01/05/2014 0241   GFRAA 10* 01/05/2014 0241   CBC    Component Value Date/Time   WBC 8.5 01/05/2014 0241   RBC 4.28 01/05/2014 0241   HGB 12.2* 01/05/2014 0241   HCT 37.0* 01/05/2014 0241   PLT 124* 01/05/2014 0241   MCV 86.4 01/05/2014 0241   MCH 28.5 01/05/2014 0241   MCHC 33.0 01/05/2014 0241   RDW 14.6 01/05/2014 0241   LYMPHSABS 1.1 03/23/2013 1136   MONOABS 0.8 03/23/2013 1136   EOSABS 0.1 03/23/2013 1136   BASOSABS 0.0 03/23/2013 1136     Assessment: 1. PAD SP anthrectomy/angioplast Lt Pop art 2. CP 3. Anemia 4. 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 ESRD 6. HTN  Plan: 1. Plan HD again today to get back on schedule    Windy Kalata

## 2014-01-05 NOTE — Progress Notes (Signed)
Echocardiogram 2D Echocardiogram has been performed.  [name] 01/05/2014, 10:30 AM

## 2014-01-11 ENCOUNTER — Inpatient Hospital Stay (HOSPITAL_COMMUNITY)
Admission: RE | Admit: 2014-01-11 | Discharge: 2014-01-14 | DRG: 246 | Disposition: A | Discharge status: Home / Self Care | Payer: Medicare Other | Source: Ambulatory Visit | Attending: Cardiology | Admitting: Cardiology

## 2014-01-11 ENCOUNTER — Encounter (HOSPITAL_COMMUNITY): Payer: Self-pay | Admitting: Physician Assistant

## 2014-01-11 ENCOUNTER — Encounter (HOSPITAL_COMMUNITY)
Admission: RE | Disposition: A | Discharge status: Home / Self Care | Payer: Self-pay | Source: Ambulatory Visit | Attending: Cardiology

## 2014-01-11 DIAGNOSIS — Z940 Kidney transplant status: Secondary | ICD-10-CM

## 2014-01-11 DIAGNOSIS — R079 Chest pain, unspecified: Secondary | ICD-10-CM

## 2014-01-11 DIAGNOSIS — M109 Gout, unspecified: Secondary | ICD-10-CM

## 2014-01-11 DIAGNOSIS — L98499 Non-pressure chronic ulcer of skin of other sites with unspecified severity: Secondary | ICD-10-CM

## 2014-01-11 DIAGNOSIS — N2581 Secondary hyperparathyroidism of renal origin: Secondary | ICD-10-CM | POA: Diagnosis present

## 2014-01-11 DIAGNOSIS — Z8546 Personal history of malignant neoplasm of prostate: Secondary | ICD-10-CM

## 2014-01-11 DIAGNOSIS — E46 Unspecified protein-calorie malnutrition: Secondary | ICD-10-CM

## 2014-01-11 DIAGNOSIS — B1920 Unspecified viral hepatitis C without hepatic coma: Secondary | ICD-10-CM | POA: Diagnosis present

## 2014-01-11 DIAGNOSIS — L97909 Non-pressure chronic ulcer of unspecified part of unspecified lower leg with unspecified severity: Secondary | ICD-10-CM

## 2014-01-11 DIAGNOSIS — L97409 Non-pressure chronic ulcer of unspecified heel and midfoot with unspecified severity: Secondary | ICD-10-CM | POA: Diagnosis present

## 2014-01-11 DIAGNOSIS — K632 Fistula of intestine: Secondary | ICD-10-CM

## 2014-01-11 DIAGNOSIS — Z86718 Personal history of other venous thrombosis and embolism: Secondary | ICD-10-CM

## 2014-01-11 DIAGNOSIS — Z5987 Material hardship due to limited financial resources, not elsewhere classified: Secondary | ICD-10-CM

## 2014-01-11 DIAGNOSIS — Z886 Allergy status to analgesic agent status: Secondary | ICD-10-CM

## 2014-01-11 DIAGNOSIS — K56609 Unspecified intestinal obstruction, unspecified as to partial versus complete obstruction: Secondary | ICD-10-CM

## 2014-01-11 DIAGNOSIS — N186 End stage renal disease: Secondary | ICD-10-CM

## 2014-01-11 DIAGNOSIS — Z87891 Personal history of nicotine dependence: Secondary | ICD-10-CM

## 2014-01-11 DIAGNOSIS — L03119 Cellulitis of unspecified part of limb: Secondary | ICD-10-CM

## 2014-01-11 DIAGNOSIS — Z992 Dependence on renal dialysis: Secondary | ICD-10-CM

## 2014-01-11 DIAGNOSIS — I739 Peripheral vascular disease, unspecified: Secondary | ICD-10-CM

## 2014-01-11 DIAGNOSIS — I120 Hypertensive chronic kidney disease with stage 5 chronic kidney disease or end stage renal disease: Secondary | ICD-10-CM | POA: Diagnosis present

## 2014-01-11 DIAGNOSIS — I209 Angina pectoris, unspecified: Secondary | ICD-10-CM

## 2014-01-11 DIAGNOSIS — K219 Gastro-esophageal reflux disease without esophagitis: Secondary | ICD-10-CM | POA: Diagnosis present

## 2014-01-11 DIAGNOSIS — N039 Chronic nephritic syndrome with unspecified morphologic changes: Secondary | ICD-10-CM

## 2014-01-11 DIAGNOSIS — Z9079 Acquired absence of other genital organ(s): Secondary | ICD-10-CM

## 2014-01-11 DIAGNOSIS — L97509 Non-pressure chronic ulcer of other part of unspecified foot with unspecified severity: Secondary | ICD-10-CM | POA: Diagnosis present

## 2014-01-11 DIAGNOSIS — M1A00X1 Idiopathic chronic gout, unspecified site, with tophus (tophi): Secondary | ICD-10-CM | POA: Diagnosis present

## 2014-01-11 DIAGNOSIS — I10 Essential (primary) hypertension: Secondary | ICD-10-CM

## 2014-01-11 DIAGNOSIS — Z8249 Family history of ischemic heart disease and other diseases of the circulatory system: Secondary | ICD-10-CM

## 2014-01-11 DIAGNOSIS — I509 Heart failure, unspecified: Secondary | ICD-10-CM | POA: Diagnosis present

## 2014-01-11 DIAGNOSIS — Z7902 Long term (current) use of antithrombotics/antiplatelets: Secondary | ICD-10-CM

## 2014-01-11 DIAGNOSIS — IMO0002 Reserved for concepts with insufficient information to code with codable children: Secondary | ICD-10-CM

## 2014-01-11 DIAGNOSIS — I70269 Atherosclerosis of native arteries of extremities with gangrene, unspecified extremity: Principal | ICD-10-CM | POA: Diagnosis present

## 2014-01-11 DIAGNOSIS — D649 Anemia, unspecified: Secondary | ICD-10-CM

## 2014-01-11 DIAGNOSIS — A630 Anogenital (venereal) warts: Secondary | ICD-10-CM

## 2014-01-11 DIAGNOSIS — I2510 Atherosclerotic heart disease of native coronary artery without angina pectoris: Secondary | ICD-10-CM | POA: Diagnosis present

## 2014-01-11 DIAGNOSIS — L03039 Cellulitis of unspecified toe: Secondary | ICD-10-CM | POA: Diagnosis present

## 2014-01-11 DIAGNOSIS — I200 Unstable angina: Secondary | ICD-10-CM | POA: Diagnosis not present

## 2014-01-11 DIAGNOSIS — T82898A Other specified complication of vascular prosthetic devices, implants and grafts, initial encounter: Secondary | ICD-10-CM

## 2014-01-11 DIAGNOSIS — L02619 Cutaneous abscess of unspecified foot: Secondary | ICD-10-CM | POA: Diagnosis present

## 2014-01-11 DIAGNOSIS — D631 Anemia in chronic kidney disease: Secondary | ICD-10-CM | POA: Diagnosis present

## 2014-01-11 DIAGNOSIS — Z79899 Other long term (current) drug therapy: Secondary | ICD-10-CM

## 2014-01-11 DIAGNOSIS — Z598 Other problems related to housing and economic circumstances: Secondary | ICD-10-CM

## 2014-01-11 DIAGNOSIS — E43 Unspecified severe protein-calorie malnutrition: Secondary | ICD-10-CM

## 2014-01-11 DIAGNOSIS — I5022 Chronic systolic (congestive) heart failure: Secondary | ICD-10-CM | POA: Diagnosis present

## 2014-01-11 DIAGNOSIS — Z681 Body mass index (BMI) 19 or less, adult: Secondary | ICD-10-CM

## 2014-01-11 HISTORY — DX: End stage renal disease: N18.6

## 2014-01-11 HISTORY — PX: ABDOMINAL AORTAGRAM: SHX5454

## 2014-01-11 HISTORY — DX: End stage renal disease: Z99.2

## 2014-01-11 HISTORY — DX: Kidney transplant status: Z94.0

## 2014-01-11 LAB — POCT I-STAT, CHEM 8
BUN: 30 mg/dL — ABNORMAL HIGH (ref 6–23)
CALCIUM ION: 1.04 mmol/L — AB (ref 1.12–1.23)
CREATININE: 6.4 mg/dL — AB (ref 0.50–1.35)
Chloride: 100 mEq/L (ref 96–112)
Glucose, Bld: 78 mg/dL (ref 70–99)
HCT: 42 % (ref 39.0–52.0)
Hemoglobin: 14.3 g/dL (ref 13.0–17.0)
Potassium: 4.3 mEq/L (ref 3.7–5.3)
Sodium: 141 mEq/L (ref 137–147)
TCO2: 31 mmol/L (ref 0–100)

## 2014-01-11 LAB — CBC
HCT: 36.4 % — ABNORMAL LOW (ref 39.0–52.0)
Hemoglobin: 11.4 g/dL — ABNORMAL LOW (ref 13.0–17.0)
MCH: 28.1 pg (ref 26.0–34.0)
MCHC: 31.3 g/dL (ref 30.0–36.0)
MCV: 89.7 fL (ref 78.0–100.0)
PLATELETS: 147 10*3/uL — AB (ref 150–400)
RBC: 4.06 MIL/uL — ABNORMAL LOW (ref 4.22–5.81)
RDW: 14.6 % (ref 11.5–15.5)
WBC: 7 10*3/uL (ref 4.0–10.5)

## 2014-01-11 LAB — MRSA PCR SCREENING: MRSA by PCR: NEGATIVE

## 2014-01-11 LAB — POCT ACTIVATED CLOTTING TIME: Activated Clotting Time: 227 seconds

## 2014-01-11 SURGERY — ABDOMINAL AORTAGRAM
Anesthesia: LOCAL | Laterality: Right

## 2014-01-11 MED ORDER — LEVOFLOXACIN 250 MG PO TABS: 250.0000 mg | ORAL_TABLET | Freq: Every day | ORAL | Status: DC

## 2014-01-11 MED ORDER — HYDRALAZINE HCL 20 MG/ML IJ SOLN
INTRAMUSCULAR | Status: AC
  Filled 2014-01-11: qty 1

## 2014-01-11 MED ORDER — PRO-STAT SUGAR FREE PO LIQD
30.0000 mL | Freq: Every day | ORAL | Status: DC
  Administered 2014-01-11: 30 mL via ORAL
  Filled 2014-01-11 (×5): qty 30

## 2014-01-11 MED ORDER — METOPROLOL TARTRATE 1 MG/ML IV SOLN
2.0000 mg | INTRAVENOUS | Status: DC | PRN
  Administered 2014-01-11: 5 mg via INTRAVENOUS

## 2014-01-11 MED ORDER — SODIUM CHLORIDE 0.9 % IV SOLN: 250.0000 mL | INTRAVENOUS | Status: DC | PRN

## 2014-01-11 MED ORDER — CALCIUM ACETATE 667 MG PO CAPS
2001.0000 mg | ORAL_CAPSULE | Freq: Three times a day (TID) | ORAL | Status: DC
  Administered 2014-01-11 – 2014-01-14 (×4): 2001 mg via ORAL
  Filled 2014-01-11 (×11): qty 3

## 2014-01-11 MED ORDER — AMLODIPINE BESYLATE 10 MG PO TABS
10.0000 mg | ORAL_TABLET | Freq: Every day | ORAL | Status: DC
  Administered 2014-01-11 – 2014-01-14 (×4): 10 mg via ORAL
  Filled 2014-01-11 (×4): qty 1

## 2014-01-11 MED ORDER — ASPIRIN 81 MG PO CHEW
324.0000 mg | CHEWABLE_TABLET | ORAL | Status: AC
  Administered 2014-01-12: 324 mg via ORAL
  Filled 2014-01-11: qty 4

## 2014-01-11 MED ORDER — LOPERAMIDE HCL 2 MG PO CAPS: 2.0000 mg | ORAL_CAPSULE | Freq: Every day | ORAL | Status: DC | PRN

## 2014-01-11 MED ORDER — CALCIUM ACETATE 667 MG PO CAPS
1334.0000 mg | ORAL_CAPSULE | ORAL | Status: DC | PRN
  Filled 2014-01-11: qty 2

## 2014-01-11 MED ORDER — PANTOPRAZOLE SODIUM 40 MG PO TBEC
40.0000 mg | DELAYED_RELEASE_TABLET | Freq: Every day | ORAL | Status: DC
  Administered 2014-01-11 – 2014-01-14 (×4): 40 mg via ORAL
  Filled 2014-01-11 (×4): qty 1

## 2014-01-11 MED ORDER — NEPRO/CARBSTEADY PO LIQD
237.0000 mL | Freq: Two times a day (BID) | ORAL | Status: DC
  Filled 2014-01-11 (×2): qty 237

## 2014-01-11 MED ORDER — OXYCODONE HCL 5 MG PO TABS
5.0000 mg | ORAL_TABLET | ORAL | Status: DC | PRN
  Administered 2014-01-11 – 2014-01-14 (×7): 10 mg via ORAL
  Administered 2014-01-14 (×2): 5 mg via ORAL
  Filled 2014-01-11 (×7): qty 2

## 2014-01-11 MED ORDER — CARVEDILOL 6.25 MG PO TABS
6.2500 mg | ORAL_TABLET | Freq: Two times a day (BID) | ORAL | Status: DC
  Administered 2014-01-11 – 2014-01-14 (×5): 6.25 mg via ORAL
  Filled 2014-01-11 (×8): qty 1

## 2014-01-11 MED ORDER — PHENOL 1.4 % MT LIQD: 1.0000 | OROMUCOSAL | Status: DC | PRN

## 2014-01-11 MED ORDER — ASPIRIN 81 MG PO CHEW
81.0000 mg | CHEWABLE_TABLET | Freq: Every day | ORAL | Status: DC
  Administered 2014-01-11 – 2014-01-14 (×4): 81 mg via ORAL
  Filled 2014-01-11 (×4): qty 1

## 2014-01-11 MED ORDER — SODIUM CHLORIDE 0.9 % IJ SOLN: 3.0000 mL | INTRAMUSCULAR | Status: DC | PRN

## 2014-01-11 MED ORDER — ALUM & MAG HYDROXIDE-SIMETH 200-200-20 MG/5ML PO SUSP: 15.0000 mL | ORAL | Status: DC | PRN

## 2014-01-11 MED ORDER — NITROGLYCERIN IN D5W 200-5 MCG/ML-% IV SOLN
INTRAVENOUS | Status: AC
  Filled 2014-01-11: qty 250

## 2014-01-11 MED ORDER — LABETALOL HCL 5 MG/ML IV SOLN
INTRAVENOUS | Status: AC
  Filled 2014-01-11: qty 4

## 2014-01-11 MED ORDER — COLCHICINE 0.6 MG PO TABS
0.6000 mg | ORAL_TABLET | Freq: Every day | ORAL | Status: DC
  Administered 2014-01-11 – 2014-01-14 (×4): 0.6 mg via ORAL
  Filled 2014-01-11 (×4): qty 1

## 2014-01-11 MED ORDER — GUAIFENESIN-DM 100-10 MG/5ML PO SYRP: 15.0000 mL | ORAL_SOLUTION | ORAL | Status: DC | PRN

## 2014-01-11 MED ORDER — HEPARIN (PORCINE) IN NACL 2-0.9 UNIT/ML-% IJ SOLN
INTRAMUSCULAR | Status: AC
  Filled 2014-01-11: qty 1000

## 2014-01-11 MED ORDER — PREDNISONE 5 MG PO TABS
5.0000 mg | ORAL_TABLET | Freq: Every day | ORAL | Status: DC
  Administered 2014-01-11 – 2014-01-14 (×4): 5 mg via ORAL
  Filled 2014-01-11 (×5): qty 1

## 2014-01-11 MED ORDER — ONDANSETRON HCL 4 MG/2ML IJ SOLN
4.0000 mg | Freq: Four times a day (QID) | INTRAMUSCULAR | Status: DC | PRN
Start: 2014-01-11 — End: 2014-01-14

## 2014-01-11 MED ORDER — NITROGLYCERIN IN D5W 200-5 MCG/ML-% IV SOLN
INTRAVENOUS | Status: AC
  Administered 2014-01-11: 20 ug/min via INTRAVENOUS
  Filled 2014-01-11: qty 250

## 2014-01-11 MED ORDER — ACETAMINOPHEN 325 MG PO TABS: 325.0000 mg | ORAL_TABLET | ORAL | Status: DC | PRN

## 2014-01-11 MED ORDER — FENTANYL CITRATE 0.05 MG/ML IJ SOLN
INTRAMUSCULAR | Status: AC
  Filled 2014-01-11: qty 2

## 2014-01-11 MED ORDER — HEPARIN SODIUM (PORCINE) 1000 UNIT/ML IJ SOLN
INTRAMUSCULAR | Status: AC
  Filled 2014-01-11: qty 1

## 2014-01-11 MED ORDER — METOPROLOL TARTRATE 1 MG/ML IV SOLN
INTRAVENOUS | Status: AC
  Filled 2014-01-11: qty 5

## 2014-01-11 MED ORDER — ATORVASTATIN CALCIUM 40 MG PO TABS
40.0000 mg | ORAL_TABLET | Freq: Every day | ORAL | Status: DC
  Administered 2014-01-11 – 2014-01-12 (×2): 40 mg via ORAL
  Filled 2014-01-11 (×4): qty 1

## 2014-01-11 MED ORDER — LABETALOL HCL 5 MG/ML IV SOLN
10.0000 mg | INTRAVENOUS | Status: DC | PRN
  Administered 2014-01-11: 10 mg via INTRAVENOUS

## 2014-01-11 MED ORDER — LIDOCAINE HCL (PF) 1 % IJ SOLN
INTRAMUSCULAR | Status: AC
  Filled 2014-01-11: qty 30

## 2014-01-11 MED ORDER — SODIUM CHLORIDE 0.9 % IJ SOLN
3.0000 mL | Freq: Two times a day (BID) | INTRAMUSCULAR | Status: DC
Start: 2014-01-11 — End: 2014-01-13
  Administered 2014-01-11 – 2014-01-12 (×2): 3 mL via INTRAVENOUS

## 2014-01-11 MED ORDER — HEPARIN (PORCINE) IN NACL 2-0.9 UNIT/ML-% IJ SOLN
INTRAMUSCULAR | Status: AC
  Filled 2014-01-11: qty 500

## 2014-01-11 MED ORDER — SODIUM CHLORIDE 0.9 % IJ SOLN
3.0000 mL | Freq: Two times a day (BID) | INTRAMUSCULAR | Status: DC
  Administered 2014-01-11: 3 mL via INTRAVENOUS

## 2014-01-11 MED ORDER — MIDAZOLAM HCL 2 MG/2ML IJ SOLN
INTRAMUSCULAR | Status: AC
  Filled 2014-01-11: qty 2

## 2014-01-11 MED ORDER — MORPHINE SULFATE 2 MG/ML IJ SOLN
2.0000 mg | INTRAMUSCULAR | Status: DC | PRN
  Administered 2014-01-11: 4 mg via INTRAVENOUS
  Administered 2014-01-13: 5 mg via INTRAVENOUS
  Administered 2014-01-13: 2 mg via INTRAVENOUS
  Administered 2014-01-13: 4 mg via INTRAVENOUS
  Filled 2014-01-11: qty 2
  Filled 2014-01-11: qty 1
  Filled 2014-01-11: qty 3
  Filled 2014-01-11: qty 2

## 2014-01-11 MED ORDER — RENA-VITE PO TABS
1.0000 | ORAL_TABLET | Freq: Every day | ORAL | Status: DC
  Administered 2014-01-11 – 2014-01-14 (×4): 1 via ORAL
  Filled 2014-01-11 (×4): qty 1

## 2014-01-11 MED ORDER — SODIUM CHLORIDE 0.9 % IV SOLN
INTRAVENOUS | Status: DC
  Administered 2014-01-12: 10 mL via INTRAVENOUS

## 2014-01-11 MED ORDER — DIAZEPAM 5 MG PO TABS
5.0000 mg | ORAL_TABLET | ORAL | Status: AC
  Administered 2014-01-12: 5 mg via ORAL
  Filled 2014-01-11: qty 1

## 2014-01-11 MED ORDER — CINACALCET HCL 30 MG PO TABS
60.0000 mg | ORAL_TABLET | Freq: Every day | ORAL | Status: DC
  Administered 2014-01-11 – 2014-01-12 (×2): 60 mg via ORAL
  Filled 2014-01-11 (×4): qty 2

## 2014-01-11 MED ORDER — NITROGLYCERIN 0.2 MG/ML ON CALL CATH LAB
INTRAVENOUS | Status: AC
  Filled 2014-01-11: qty 1

## 2014-01-11 MED ORDER — ACETAMINOPHEN 650 MG RE SUPP: 325.0000 mg | RECTAL | Status: DC | PRN

## 2014-01-11 MED ORDER — TIROFIBAN (AGGRASTAT) BOLUS VIA INFUSION
25.0000 ug/kg | Freq: Once | INTRAVENOUS | Status: AC
  Administered 2014-01-11: 1185 ug via INTRAVENOUS
  Filled 2014-01-11: qty 24

## 2014-01-11 MED ORDER — HYDRALAZINE HCL 20 MG/ML IJ SOLN
10.0000 mg | INTRAMUSCULAR | Status: DC | PRN
  Administered 2014-01-11 (×2): 10 mg via INTRAVENOUS

## 2014-01-11 MED ORDER — NITROGLYCERIN IN D5W 200-5 MCG/ML-% IV SOLN
2.0000 ug/min | INTRAVENOUS | Status: DC
  Administered 2014-01-11: 20 ug/min via INTRAVENOUS

## 2014-01-11 MED ORDER — TIROFIBAN HCL IV 5 MG/100ML
0.0750 ug/kg/min | INTRAVENOUS | Status: DC
  Administered 2014-01-11 – 2014-01-13 (×3): 0.075 ug/kg/min via INTRAVENOUS
  Filled 2014-01-11 (×5): qty 100

## 2014-01-11 MED ORDER — CALCITRIOL 0.5 MCG PO CAPS
1.0000 ug | ORAL_CAPSULE | ORAL | Status: DC
  Administered 2014-01-12 – 2014-01-14 (×2): 1 ug via ORAL
  Filled 2014-01-11 (×2): qty 2

## 2014-01-11 MED ORDER — VERAPAMIL HCL 2.5 MG/ML IV SOLN
INTRAVENOUS | Status: AC
  Filled 2014-01-11: qty 2

## 2014-01-11 SURGICAL SUPPLY — 55 items
ADH SKN CLS APL DERMABOND .7 (GAUZE/BANDAGES/DRESSINGS) ×1
BANDAGE ELASTIC 4 VELCRO ST LF (GAUZE/BANDAGES/DRESSINGS) IMPLANT
BANDAGE ESMARK 6X9 LF (GAUZE/BANDAGES/DRESSINGS) IMPLANT
BNDG CMPR 9X6 STRL LF SNTH (GAUZE/BANDAGES/DRESSINGS)
BNDG ESMARK 6X9 LF (GAUZE/BANDAGES/DRESSINGS)
CANISTER SUCTION 2500CC (MISCELLANEOUS) ×2 IMPLANT
CLIP TI MEDIUM 24 (CLIP) ×2 IMPLANT
CLIP TI WIDE RED SMALL 24 (CLIP) ×2 IMPLANT
COVER SURGICAL LIGHT HANDLE (MISCELLANEOUS) ×2 IMPLANT
CUFF TOURNIQUET SINGLE 24IN (TOURNIQUET CUFF) IMPLANT
CUFF TOURNIQUET SINGLE 34IN LL (TOURNIQUET CUFF) IMPLANT
CUFF TOURNIQUET SINGLE 44IN (TOURNIQUET CUFF) IMPLANT
DERMABOND ADVANCED (GAUZE/BANDAGES/DRESSINGS) ×1
DERMABOND ADVANCED .7 DNX12 (GAUZE/BANDAGES/DRESSINGS) ×1 IMPLANT
DRAIN CHANNEL 15F RND FF W/TCR (WOUND CARE) IMPLANT
DRAPE WARM FLUID 44X44 (DRAPE) ×2 IMPLANT
DRAPE X-RAY CASS 24X20 (DRAPES) IMPLANT
DRSG COVADERM 4X10 (GAUZE/BANDAGES/DRESSINGS) IMPLANT
DRSG COVADERM 4X8 (GAUZE/BANDAGES/DRESSINGS) IMPLANT
ELECT REM PT RETURN 9FT ADLT (ELECTROSURGICAL) ×2
ELECTRODE REM PT RTRN 9FT ADLT (ELECTROSURGICAL) ×1 IMPLANT
EVACUATOR SILICONE 100CC (DRAIN) IMPLANT
GLOVE BIOGEL PI IND STRL 7.5 (GLOVE) ×1 IMPLANT
GLOVE BIOGEL PI INDICATOR 7.5 (GLOVE) ×1
GLOVE SURG SS PI 7.5 STRL IVOR (GLOVE) ×2 IMPLANT
GOWN PREVENTION PLUS XXLARGE (GOWN DISPOSABLE) ×2 IMPLANT
GOWN STRL NON-REIN LRG LVL3 (GOWN DISPOSABLE) ×6 IMPLANT
HEMOSTAT SNOW SURGICEL 2X4 (HEMOSTASIS) IMPLANT
KIT BASIN OR (CUSTOM PROCEDURE TRAY) ×2 IMPLANT
KIT ROOM TURNOVER OR (KITS) ×2 IMPLANT
MARKER GRAFT CORONARY BYPASS (MISCELLANEOUS) IMPLANT
NS IRRIG 1000ML POUR BTL (IV SOLUTION) ×4 IMPLANT
PACK PERIPHERAL VASCULAR (CUSTOM PROCEDURE TRAY) ×2 IMPLANT
PAD ARMBOARD 7.5X6 YLW CONV (MISCELLANEOUS) ×4 IMPLANT
PADDING CAST COTTON 6X4 STRL (CAST SUPPLIES) IMPLANT
SET COLLECT BLD 21X3/4 12 (NEEDLE) IMPLANT
STOPCOCK 4 WAY LG BORE MALE ST (IV SETS) IMPLANT
SUT ETHILON 3 0 PS 1 (SUTURE) IMPLANT
SUT PROLENE 5 0 C 1 24 (SUTURE) ×2 IMPLANT
SUT PROLENE 6 0 BV (SUTURE) ×2 IMPLANT
SUT PROLENE 7 0 BV 1 (SUTURE) IMPLANT
SUT SILK 2 0 SH (SUTURE) ×2 IMPLANT
SUT SILK 3 0 (SUTURE)
SUT SILK 3-0 18XBRD TIE 12 (SUTURE) IMPLANT
SUT VIC AB 2-0 CT1 27 (SUTURE) ×4
SUT VIC AB 2-0 CT1 TAPERPNT 27 (SUTURE) ×2 IMPLANT
SUT VIC AB 3-0 SH 27 (SUTURE) ×4
SUT VIC AB 3-0 SH 27X BRD (SUTURE) ×2 IMPLANT
SUT VICRYL 4-0 PS2 18IN ABS (SUTURE) ×4 IMPLANT
TOWEL OR 17X24 6PK STRL BLUE (TOWEL DISPOSABLE) ×4 IMPLANT
TOWEL OR 17X26 10 PK STRL BLUE (TOWEL DISPOSABLE) ×4 IMPLANT
TRAY FOLEY CATH 16FRSI W/METER (SET/KITS/TRAYS/PACK) ×2 IMPLANT
TUBING EXTENTION W/L.L. (IV SETS) IMPLANT
UNDERPAD 30X30 INCONTINENT (UNDERPADS AND DIAPERS) ×2 IMPLANT
WATER STERILE IRR 1000ML POUR (IV SOLUTION) ×2 IMPLANT

## 2014-01-11 NOTE — Progress Notes (Signed)
Notified IV team for 2nd Site.  Pt unaware of cath procedure for 01/12/14.  Prep right radial and right groin.  Pt watched cath video.  Vs stable will continue to monitor. Wynona Canes

## 2014-01-11 NOTE — H&P (Signed)
Patient name: [name] MRN: [id number] DOB: [birth year] Sex: male  Referred by: Dr. Detterding  Reason for referral:  Chief Complaint   Patient presents with   .  New Evaluation     gangrene/ischemic changes to L foot, ulcer on R 3rd digit   HISTORY OF PRESENT ILLNESS:  This is a 53 year old gentleman who was referred today for evaluation of bilateral toe ulcers. The patient states that these have been present for approximately 3 months. They cause him significant rest pain.  The patient suffers from end-stage renal disease. He has a left thigh graft in place for dialysis on Tuesday Thursday Saturday. He has hypertension which has gotten worse since his pain has escalated. He has chronic congestive heart failure. His most recent echo shows an ejection fraction of 40-50%  Past Medical History   Diagnosis  Date   .  Prostate cancer    .  Hypertension    .  Renal transplant disorder  1997     Now HD   .  Gout tophi    .  Chronic steroid use      For Gout transplant. Has sever gout. Did not tolerate Allopurinol. Do not taper per PCP   .  Systolic CHF, chronic      2 D echo 04/2012 with EF of 45 %   .  Malnutrition    .  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 F/U with Dr Donne Hazel. Started on TNA   .  Anemia associated with chronic renal failure    .  Hepatitis C    .  Heart murmur    .  ESRD needing dialysis      [facility]   .  GERD (gastroesophageal reflux disease)    .  H/O hiatal hernia    .  Arthritis    .  DVT (deep venous thrombosis)    .  Peripheral vascular disease     Past Surgical History   Procedure  Laterality  Date   .  Laparotomy   04/14/2012     Procedure: EXPLORATORY LAPAROTOMY; Surgeon: Joyice Faster. Cornett, MD; Location: [place]; Service: General; Laterality: N/A;   .  Colon resection   04/14/2012   .  Laparotomy   04/22/2012     Procedure: EXPLORATORY  LAPAROTOMY; Surgeon: Adin Hector, MD; Location: [place]; Service: General; Laterality: N/A; lysis of adhesions, omentoplasty, repair small bowel   .  Thrombectomy w/ embolectomy   04/27/2012     Procedure: THROMBECTOMY ARTERIOVENOUS GORE-TEX GRAFT; Surgeon: Angelia Mould, MD; Location: [place]; Service: Vascular; Laterality: Left; Thrombectomy of left thigh arteriovenous gortex graft   .  Prostectomy   2011   .  Renal grafts     .  Pelvic abcess drainage  Right  6/14     removal drain s/p bowl resection 13   .  Transanal excision of rectal mass  N/A  03/30/2013     Procedure: EXCISION OF anal MASS; Surgeon: Joyice Faster. Cornett, MD; Location: [place]; Service: General; Laterality: N/A; Exam under anesthesia with excision anal verge mass    History    Social History   .  Marital Status:  Single     Spouse Name:  N/A     Number of Children:  N/A   .  Years of Education:  N/A    Occupational  History   .  Not on file.    Social History Main Topics   .  Smoking status:  Former Smoker -- 1.00 packs/day for 10 years     Types:  Cigarettes     Quit date:  03/23/1973   .  Smokeless tobacco:  Not on file      Comment: Quit 30 years ago   .  Alcohol Use:  No   .  Drug Use:  No   .  Sexual Activity:  No    Other Topics  Concern   .  Not on file    Social History Narrative    Currently in the nursing home until end of the month then will go to live with sister.          Family History   Problem  Relation  Age of Onset   .  Hypertension  Father    .  Pneumonia  Brother    .  Lung disease  Brother     Allergies as of 12/26/2013 - Review Complete 12/26/2013   Allergen  Reaction  Noted   .  Allopurinol  Other (See Comments)  02/27/2010   .  Aspirin  Other (See Comments)  02/27/2010    Current Outpatient Prescriptions on File Prior to Visit   Medication  Sig  Dispense  Refill   .  calcium acetate (PHOSLO) 667 MG tablet  Take 2,001 mg by mouth 3 (three) times daily.     .  cinacalcet  (SENSIPAR) 60 MG tablet  Take 60 mg by mouth daily.     .  clotrimazole (MYCELEX) 10 MG troche  Take 10 mg by mouth 4 (four) times daily.     Marland Kitchen  COLCRYS 0.6 MG tablet  Take 0.6 mg by mouth daily.     Marland Kitchen  LIDOCAINE EX  Apply 1 application topically See admin instructions. Apply to inject site on dialysis days (Tues, Thur, Sat) if needed for pain     .  loperamide (IMODIUM A-D) 2 MG tablet  Take 2 mg by mouth daily as needed. For diarrhea     .  multivitamin (RENA-VIT) TABS tablet  Take 1 tablet by mouth daily.  30 tablet  0   .  omeprazole (PRILOSEC) 40 MG capsule  Take 40 mg by mouth daily.     Marland Kitchen  oxyCODONE-acetaminophen (PERCOCET/ROXICET) 5-325 MG per tablet  Take 1 tablet by mouth every 6 (six) hours as needed for pain. For pain     .  predniSONE (DELTASONE) 5 MG tablet  Take 5 mg by mouth daily.      Current Facility-Administered Medications on File Prior to Visit   Medication  Dose  Route  Frequency  Provider  Last Rate  Last Dose   .  fentaNYL (SUBLIMAZE) injection   Intravenous  PRN  Carylon Perches, MD   25 mcg at 05/05/12 1544   .  midazolam (VERSED) 5 MG/5ML injection   Intravenous  PRN  Carylon Perches, MD   2 mg at 05/05/12 1544   REVIEW OF SYSTEMS:  Cardiovascular: Positive for chest pain, a lateral leg pain  Pulmonary: No productive cough, asthma or wheezing.  Neurologic: Positive for leg weakness  Hematologic: No bleeding problems or clotting disorders.  Musculoskeletal: No joint pain or joint swelling.  Gastrointestinal: No blood in stool or hematemesis  Genitourinary: No dysuria or hematuria.  Psychiatric:: No history of major depression.  Integumentary: No rashes or ulcers.  Constitutional: No fever or chills.  PHYSICAL EXAMINATION:  General: The patient appears their stated age. Vital signs are BP 177/79  Pulse 80  Temp(Src) 97.5 F (36.4 C) (Oral)  Ht 5\' 11"  (1.803 m)  Wt 108 lb 3.2 oz (49.079 kg)  BMI 15.10 kg/m2  SpO2 100%  HEENT: No gross abnormalities    Pulmonary: Respirations are non-labored  Abdomen: Soft and non-tender  Musculoskeletal: There are no major deformities.  Neurologic: No focal weakness or paresthesias are detected,  Skin: Right great toe ulceration along the tendon, extending to the second toe. Ischemic changes to the tip of the left great toe with surrounding erythema  Psychiatric: The patient has normal affect.  Cardiovascular: There is a regular rate and rhythm without significant murmur appreciated. Palpable thrill within left thigh graft. No palpable pedal pulses  Diagnostic Studies:  ABI studies could not be performed today secondary to calcified vessels. The patient has monophasic waveforms bilaterally  Assessment:  Bilateral lower extremity ulcerations, secondary to arterial insufficiency  Plan:  The patient needs to have better imaging of his lower extremity disease. I have scheduled an arteriogram for Tuesday, April 14. L. plan on access in the right groin with bilateral lower extremity evaluation and intervention as indicated. I am going to treat the patient with one week of Levaquin to see if some of the cellulitis resolves. This may also help his discomfort. I discussed 3 potential options after the angiogram. #1 would be disease amenable to percutaneous stenting. #2 would be disease amenable to surgical revascularization and #3 would be disease which cannot be treated. I did discuss that this is a limb threatening situation. We also discussed the possibility of needing to remove his left thigh graft.   Patient is back to day for intervention of his right leg     V. Leia Alf, M.D.  [facility]  Office: [phone]  Pager: [phone]

## 2014-01-11 NOTE — Consult Note (Signed)
CARDIOLOGY CONSULT NOTE   Patient ID: [name] MRN: [id number] DOB/AGE: 04/16/1961 53 y.o.  Admit date: 01/11/2014  Primary Physician   DETERDING,JAMES L, MD Primary Cardiologist  Dr. Claiborne Billings Reason for Consultation   Chest pain, abnl ECG  KGM:[id number] [name] is a 53 y.o. male with no history of CAD. 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   He was hospitalized 04/14-04/16 when he was admitted for PV angiogram and had PTA with atherectomy to the left popliteal artery. He was seen by cardiology that admission for chest pain, an echocardiogram was performed with normal LV function, results below.  He came back to the hospital for PV intervention on his right popliteal. He had atherectomy x 2 to the right popliteal, balloon angioplasty x 2 to the right popliteal and angioplasty to the right dorsalis pedis (anterior tibial artery).  Post-procedure, he developed chest pain, in the setting of SBP > 240, ECG is abnormal and cardiology was asked to evaluate.   [name] has had chest pain about 10 times total. The episodes start with and without exertion. They are not associated with SOB, N&V or diaphoresis. There is sometimes an associated headache, but not always. He will lie down and close his eyes and the symptoms will resolve in less than 1 hour. The intensity is an 8-9/10. He has not tried any medications. The pain does not consistently occur in association with HD. The episode today was the first episode since his previous admission. Currently he is resting comfortably.  Stress GXT test performed 06/10/2013 at [facility], heart did not reach target, nondiagnostic. Lexiscan nuclear test 06/24/2013 showed normal perfusion, no scar or ischemia, EF 48%.   His last office visit for transplant status was 04/08/215: [name] is a 57 y.o. African American male with a  history of end-stage renal disease as a result of hypertension who first underwent cadaveric renal transplant in 1987 with return to dialysis in 2004. He has been on our list for re-transplantation since 2006 with a period of in-activation for a year due to financial issues. He remained active from 2008 until July 2011 he was in-activated again due to the diagnosis of Prostate Cancer. He underwent surgical excision and waiting period per protocol with subsequent hospitalization due to small bowel obstruction in 2013 with resection of a portion of bowel with primary renastamosis but developed wound dehiscence requiring another operation in August 2013, and ultimately TPN via a right femoral line. During that time he was hospitalized for extensive period of time and developed significant weight loss and nutritional deficiencies. He was last seen in our clinic by Dr. Tamala Julian in October 2014 with recommendation to remain inactive because of low BMI and need for evaluation of his Hepatitis C. Since then, he has had no intercurrent hospitalizations or major illnesses. He was evaluated by Dr. Loletha Grayer in our hepatology Clinic earlier today with a plan to re-evaluate in 6 months.  Unfortunately, he has had progressive lower extremity pain and now with some evidence of vascular compromise and possible dry gangrene requiring initiation levofloxacin yesterday. He is also scheduled for LE angiography at [facility] on 01/03/14. He has had dry lesions on the L 1st/2nd digits and R 1-3 with involvement of the 1st metatarsal. He denies any active fevers chills erythema redness. His weekly dialysis continues to proceed without complications, Tuesday Thursday Saturday  schedule via left thigh graft for 3.5 hr. His weight remains low at approximately 107 lb corresponding to a BMI of ~15. He states that he is unable to increase his intake given the severity of his pain for which he takes intermittent oxycodone and Tylenol. He is  accompanied by his sister who helps to takes care of him. Assessment: [name] is still too high of a risk for transplantation at this time, especially in light of the lower extremity dry gangrene . He needs to continue to gain weight to a BMI of 17 - 18. He also needs an additional period absent of any complications. His waiting time is long so that if we can get him into transplantable condition it will likely occur quickly. However this will take some time even in the best case scenario.   Once his nutrition and lower extremity vascular issues are resolved, he will likely need a follow up nuclear perfusion stress test (last 10/14) as well as reassessment in the liver clinic. Abd CT revealed severe atherosclerotic calcifications visualized throughout the abdominal aorta as well as within nearly all branch vessels. Of note, the right external iliac artery is relatively free of atherosclerotic calcifications with mild atherosclerotic calcifications visualized within the distal portion of the right external iliac artery. He will also need follow up evaluation of his hepatitis C and liver, with possible biopsy (last bx 5/08 grade 2 activity and stage 2 fibrosis).  Plan: The risks of transplantation including the risk of life-threatening infection, the slightly increased risk of malignancy, and the possible side effects of the immunosuppressant agents were described in detail. The expected results of transplantation in terms of graft survival were discussed for both a living donor and a deceased donor transplant. The average expected survival benefit of transplantation was also discussed.  We will proceed with the workup as described in the "Assessment" Section above. This was discussed with the transplant coordinator at the conclusion of the visit.   Past Medical History  Diagnosis Date  . Prostate cancer   . Hypertension   . Renal transplant disorder 1997    Now HD   . Gout tophi   . Chronic steroid  use     Has severe gout. Did not tolerate Allopurinol. Do not taper per PCP   . Systolic CHF, chronic     2 D echo 04/2012 with EF of 45 %   . Malnutrition   . 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 F/U with Dr Donne Hazel. Started on TNA  . Anemia associated with chronic renal failure   . Hepatitis C   . Heart murmur   . ESRD needing dialysis     [facility]  . GERD (gastroesophageal reflux disease)   . H/O hiatal hernia   . Arthritis   . DVT (deep venous thrombosis)   . Peripheral vascular disease     Past Surgical History  Procedure Laterality Date  . Laparotomy  04/14/2012    Procedure: EXPLORATORY LAPAROTOMY;  Surgeon: Joyice Faster. Cornett, MD;  Location: [place];  Service: General;  Laterality: N/A;  . Colon resection  04/14/2012  . Laparotomy  04/22/2012    Procedure: EXPLORATORY LAPAROTOMY;  Surgeon: Adin Hector, MD;  Location: [place];  Service: General;  Laterality: N/A;  lysis of adhesions, omentoplasty, repair small bowel  . Thrombectomy w/ embolectomy  04/27/2012    Procedure: THROMBECTOMY  ARTERIOVENOUS GORE-TEX GRAFT;  Surgeon: Angelia Mould, MD;  Location: [place];  Service: Vascular;  Laterality: Left;  Thrombectomy of left thigh arteriovenous gortex graft  . Prostectomy  2011  . Renal grafts    . Pelvic abcess drainage Right 6/14    removal drain s/p bowl resection 13  . Transanal excision of rectal mass N/A 03/30/2013    Procedure: EXCISION OF anal MASS;  Surgeon: Joyice Faster. Cornett, MD;  Location: [place];  Service: General;  Laterality: N/A;  Exam under anesthesia with excision anal verge mass    Allergies  Allergen Reactions  . Allopurinol Other (See Comments)    REACTION: decreased platelets  . Aspirin Other (See Comments)    REACTION: unknown reaction    I have reviewed the patient's current medications . amLODipine  10 mg Oral Daily  . calcium acetate   2,001 mg Oral TID WC  . carvedilol  6.25 mg Oral BID WC  . cinacalcet  60 mg Oral Q supper  . colchicine  0.6 mg Oral Daily  . multivitamin  1 tablet Oral Daily  . pantoprazole  40 mg Oral Daily  . predniSONE  5 mg Oral Q breakfast   . nitroGLYCERIN 20 mcg/min (01/11/14 1329)   acetaminophen, acetaminophen, alum & mag hydroxide-simeth, calcium acetate, guaiFENesin-dextromethorphan, hydrALAZINE, labetalol, loperamide, metoprolol, morphine injection, ondansetron, oxyCODONE, phenol  Medication Sig  amLODipine (NORVASC) 5 MG tablet Take 1 tablet (5 mg total) by mouth daily.  calcium acetate (PHOSLO) 667 MG capsule Take 1,334 mg by mouth as needed (with snacks).  calcium acetate (PHOSLO) 667 MG tablet Take 2,001 mg by mouth 3 (three) times daily.   cinacalcet (SENSIPAR) 60 MG tablet Take 60 mg by mouth daily.  COLCRYS 0.6 MG tablet Take 0.6 mg by mouth daily.   loperamide (IMODIUM A-D) 2 MG tablet Take 2 mg by mouth daily as needed. For diarrhea  multivitamin (RENA-VIT) TABS tablet Take 1 tablet by mouth daily.  omeprazole (PRILOSEC) 40 MG capsule Take 40 mg by mouth daily.   oxyCODONE-acetaminophen (PERCOCET/ROXICET) 5-325 MG per tablet Take 1 tablet by mouth every 6 (six) hours as needed. For pain  predniSONE (DELTASONE) 5 MG tablet Take 5 mg by mouth daily.  levofloxacin (LEVAQUIN) 250 MG tablet Take 1 tablet (250 mg total) by mouth daily.  LIDOCAINE EX Apply 1 application topically See admin instructions. Apply to inject site on dialysis days (Tues, Thur, Sat) if needed for pain  metoprolol tartrate (LOPRESSOR) 12.5 mg TABS tablet Take 0.5 tablets (12.5 mg total) by mouth 2 (two) times daily.     History   Social History  . Marital Status: Single    Spouse Name: N/A    Number of Children: N/A  . Years of Education: N/A   Occupational History  . Not on file.   Social History Main Topics  . Smoking status: Former Smoker -- 1.00 packs/day for 10 years    Types: Cigarettes     Quit date: 03/23/1973  . Smokeless tobacco: Not on file     Comment: Quit 30 years ago  . Alcohol Use: No  . Drug Use: No  . Sexual Activity: No   Other Topics Concern  . Not on file   Social History Narrative   Currently in the nursing home until end of the month then will go to live with sister.           Family Status  Relation Status Death Age  . Mother Deceased   .  Father Deceased     MI  . Sister Alive   . Brother Deceased 26   Family History  Problem Relation Age of Onset  . Hypertension Father   . Pneumonia Brother   . Lung disease Brother      ROS:  Full 14 point review of systems complete and found to be negative unless listed above.  Physical Exam: Blood pressure 185/76, pulse 95, temperature 98.8 F (37.1 C), temperature source Oral, resp. rate 17, height 5\' 11"  (1.803 m), weight 104 lb 8 oz (47.4 kg), SpO2 100.00%.  General: Well developed, frail-appearing male in no acute distress Head: Eyes PERRLA, No xanthomas.   Normocephalic and atraumatic, oropharynx without edema or exudate. Dentition: poor  Lungs: rales bases Heart: HRRR S1 S2, no rub/gallop, 2/6 murmur. pulses are decreased in all extrem due to former HD sites in both UE and PVD in LE.  Neck: No carotid bruits. No lymphadenopathy.  JVD at 8 cm Abdomen: Bowel sounds present, abdomen soft and non-tender without masses or hernias noted. Msk:  No spine or cva tenderness. Generalized weakness, no joint deformities or effusions. Extremities: No clubbing or cyanosis. no edema.  Neuro: Alert and oriented X 3. No focal deficits noted. Psych:  Good affect, responds appropriately Skin: Both LE with dry scaly skin. Ischemic changes with possible Dry gangrene noted on R 1st & 2nd toes and L 1st toe   Labs:   Lab Results  Component Value Date   WBC 8.5 01/05/2014   HGB 14.3 01/11/2014   HCT 42.0 01/11/2014   MCV 86.4 01/05/2014   PLT 124* 01/05/2014     Recent Labs Lab 01/05/14 0241 01/11/14 0656  NA  137 141  K 3.7 4.3  CL 95* 100  CO2 25  --   BUN 25* 30*  CREATININE 6.48* 6.40*  CALCIUM 8.6  --   GLUCOSE 74 78  ALBUMIN 2.8*  --    Lab Results  Component Value Date   CHOL 121 01/04/2014   HDL 68 01/04/2014   LDLCALC 26 01/04/2014   TRIG 137 01/04/2014   TSH  Date/Time Value Ref Range Status  07/01/2012  4:30 PM 0.261* 0.350 - 4.500 uIU/mL Final   Echo: 01/05/2014 Study Conclusions Left ventricle: The cavity size was normal. Wall thickness was increased in a pattern of mild LVH. Systolic function was normal. The estimated ejection fraction was in the range of 55% to 60%. Wall motion was normal; there were no regional wall motion abnormalities. Features are consistent with a pseudonormal left ventricular filling pattern, with concomitant abnormal relaxation and increased filling pressure (grade 2 diastolic dysfunction). Doppler parameters are consistent with high ventricular filling pressure.  ECG:  01/11/2014 SR, LVH Vent. rate 74 BPM PR interval 172 ms QRS duration 82 ms QT/QTc 438/486 ms P-R-T axes 100 45 72  Radiology:  No results found.  ASSESSMENT AND PLAN:   The patient was seen today by Dr. Aundra Dubin, the patient evaluated and the data reviewed.  Active Problems:   HTN (hypertension)   PAD (peripheral artery disease)   Angina decubitus - cath scheduled at 8 am, tomorrow   ESRD on HD - Dr. Jonnie Finner aware, will see pt and manage HD.   Signed: Lonn Georgia, PA-C 01/11/2014 2:10 PM Beeper [id number]  Co-Sign MD  Patient seen with PA, agree with the above note. He had an episode of recurrent chest pain today post-peripheral intervention.  BP was very high as well.  1. Chest pain: Multiple  episodes now.  Some at rest and some in setting of exertion.  CP today in the setting of very elevated blood pressure.  Many risk factors for CAD (PAD, HTN, ESRD).  Additionally, he is undergoing workup for a renal transplant.  I think that we probably need to put this  issue to rest, so will arrange for left heart catheterization tomorrow.  Continue ASA, will add statin.  2. HTN: BP very high, hypertensive emergency given chest pain concerning for angina.  Will control with NTG gtt.  Will also increase amlodipine to 10 mg daily and add Coreg 6.25 mg bid.  3. PAD: s/p peripheral intervention by Dr. Trula Slade. Care per [facility].  4. ESRD: HD per renal service.   Larey Dresser 01/11/2014 2:26 PM

## 2014-01-11 NOTE — Progress Notes (Signed)
ANTICOAGULATION CONSULT NOTE - Initial Consult  Pharmacy Consult for Tirofiban Indication: Right foot micro-emboli  Allergies  Allergen Reactions  . Allopurinol Other (See Comments)    REACTION: decreased platelets  . Aspirin Other (See Comments)    REACTION: unknown reaction   Patient Measurements: Height: 5\' 11"  (180.3 cm) Weight: 104 lb 8 oz (47.4 kg) IBW/kg (Calculated) : 75.3  Vital Signs: Temp: 98.8 F (37.1 C) (04/22 1300) Temp src: Oral (04/22 1300) BP: 116/42 mmHg (04/22 1515) Pulse Rate: 86 (04/22 1515)  Labs:  Recent Labs  01/11/14 0656  HGB 14.3  HCT 42.0  CREATININE 6.40*    Estimated Creatinine Clearance: 8.8 ml/min (by C-G formula based on Cr of 6.4).   Medical History: Past Medical History  Diagnosis Date  . Prostate cancer   . Hypertension   . Renal transplant disorder 1997    Now HD   . Gout tophi   . Chronic steroid use     Has severe gout. Did not tolerate Allopurinol. Do not taper per PCP   . Systolic CHF, chronic     2 D echo 04/2012 with EF of 45 %   . Malnutrition   . 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 F/U with Dr Donne Hazel. Started on TNA  . Anemia associated with chronic renal failure   . Hepatitis C   . Heart murmur   . ESRD needing dialysis     [facility],sat  . GERD (gastroesophageal reflux disease)   . H/O hiatal hernia   . Arthritis   . DVT (deep venous thrombosis)   . Peripheral vascular disease    Assessment: 53yo male with severe peripheral vascular disease and is s/p atherectomy with balloon angioplasty today.  We have been asked to initiate IV tirofiban for possible micro-emboli.  He has a known history of DVT as well which places him at risk for ongoing thrombosis.  His H/H is WNL today 14.3/42.0 and he had some mild thrombocytopenia with platelet count of 124K.  No noted bleeding complications.  Goal of  Therapy:  Monitor platelets by anticoagulation protocol: Yes   Plan:  - Tirofiban 21mcg/kg bolus x 1 - Tirofiban 0.075 mcg/kg/min continuous infusion - Check CBC in 6 hours and daily  Rober Minion, PharmD., MS Clinical Pharmacist Pager:  [phone] Thank you for allowing pharmacy to be part of this patients care team. 01/11/2014,3:48 PM

## 2014-01-11 NOTE — Progress Notes (Signed)
Utilization Review Completed.Neoma Laming T Dowell4/22/2015

## 2014-01-11 NOTE — Consult Note (Signed)
[facility] Renal Consultation Note    Indication for Consultation:  Management of ESRD/hemodialysis; anemia, hypertension/volume and secondary hyperparathyroidism  HPI: [name] is a 53 y.o. male ESRD patient (TTS HD) with complex medical problems including PVD, rest pain and ischemic ulcers to his lower extremities. He was admitted 4/14-4/16 for lower extremity angiogram and PTA and atherectomy of the left popliteal artery and subsequently experienced chest pain during that admission. Cardiology was consulted, opting for medical management of hs symptoms. He  presented today for planned revascularization of his right lower extremity, with recurrence of chest pain post operatively. Systolic blood pressures were also elevated in the 200s. Cardiology has been consulted and plans for left heart catheterization tomorrow. He is in good spirits and complains only of mild LE discomfort. Blood pressure is now controlled on nitro drip and there has been no further chest pain.  Past Medical History  Diagnosis Date  . Prostate cancer   . Hypertension   . Renal transplant disorder 1997    Now HD   . Gout tophi   . Chronic steroid use     Has severe gout. Did not tolerate Allopurinol. Do not taper per PCP   . Systolic CHF, chronic     2 D echo 04/2012 with EF of 45 %   . Malnutrition   . 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 F/U with Dr Donne Hazel. Started on TNA  . Anemia associated with chronic renal failure   . Hepatitis C   . Heart murmur   . ESRD needing dialysis     [facility],thur,sat  . GERD (gastroesophageal reflux disease)   . H/O hiatal hernia   . Arthritis   . DVT (deep venous thrombosis)   . Peripheral vascular disease    Past Surgical History  Procedure Laterality Date  . Laparotomy  04/14/2012    Procedure: EXPLORATORY LAPAROTOMY;  Surgeon: Joyice Faster. Cornett,  MD;  Location: [place];  Service: General;  Laterality: N/A;  . Colon resection  04/14/2012  . Laparotomy  04/22/2012    Procedure: EXPLORATORY LAPAROTOMY;  Surgeon: Adin Hector, MD;  Location: [place];  Service: General;  Laterality: N/A;  lysis of adhesions, omentoplasty, repair small bowel  . Thrombectomy w/ embolectomy  04/27/2012    Procedure: THROMBECTOMY ARTERIOVENOUS GORE-TEX GRAFT;  Surgeon: Angelia Mould, MD;  Location: [place];  Service: Vascular;  Laterality: Left;  Thrombectomy of left thigh arteriovenous gortex graft  . Prostectomy  2011  . Renal grafts    . Pelvic abcess drainage Right 6/14    removal drain s/p bowl resection 13  . Transanal excision of rectal mass N/A 03/30/2013    Procedure: EXCISION OF anal MASS;  Surgeon: Joyice Faster. Cornett, MD;  Location: [place];  Service: General;  Laterality: N/A;  Exam under anesthesia with excision anal verge mass   Family History  Problem Relation Age of Onset  . Hypertension Father   . Pneumonia Brother   . Lung disease Brother    Social History:  reports that he quit smoking about 40 years ago. His smoking use included Cigarettes. He has a 10 pack-year smoking history. He does not have any smokeless tobacco history on file. He reports that he does not drink alcohol or use illicit drugs. Allergies  Allergen Reactions  . Allopurinol Other (See Comments)    REACTION: decreased platelets  .  Aspirin Other (See Comments)    REACTION: unknown reaction   Prior to Admission medications   Medication Sig Start Date End Date Taking? Authorizing Provider  amLODipine (NORVASC) 5 MG tablet Take 1 tablet (5 mg total) by mouth daily. 01/05/14  Yes Ulyses Amor, PA-C  calcium acetate (PHOSLO) 667 MG capsule Take 1,334 mg by mouth as needed (with snacks).   Yes Historical Provider, MD  calcium acetate (PHOSLO) 667 MG tablet Take 2,001 mg by mouth 3 (three) times daily.  01/24/13  Yes Historical Provider, MD  cinacalcet (SENSIPAR) 60 MG tablet  Take 60 mg by mouth daily.   Yes Historical Provider, MD  COLCRYS 0.6 MG tablet Take 0.6 mg by mouth daily.  01/04/13  Yes Historical Provider, MD  loperamide (IMODIUM A-D) 2 MG tablet Take 2 mg by mouth daily as needed. For diarrhea 08/13/12  Yes Thomas A. Cornett, MD  multivitamin (RENA-VIT) TABS tablet Take 1 tablet by mouth daily. 07/05/12  Yes Rosalia Hammers, MD  omeprazole (PRILOSEC) 40 MG capsule Take 40 mg by mouth daily.  02/16/13  Yes Historical Provider, MD  oxyCODONE-acetaminophen (PERCOCET/ROXICET) 5-325 MG per tablet Take 1 tablet by mouth every 6 (six) hours as needed. For pain 01/05/14  Yes Ulyses Amor, PA-C  predniSONE (DELTASONE) 5 MG tablet Take 5 mg by mouth daily.   Yes Historical Provider, MD  levofloxacin (LEVAQUIN) 250 MG tablet Take 1 tablet (250 mg total) by mouth daily. 12/26/13   Serafina Mitchell, MD  LIDOCAINE EX Apply 1 application topically See admin instructions. Apply to inject site on dialysis days (Tues, Thur, Sat) if needed for pain    Historical Provider, MD  metoprolol tartrate (LOPRESSOR) 12.5 mg TABS tablet Take 0.5 tablets (12.5 mg total) by mouth 2 (two) times daily. 01/05/14   Ulyses Amor, PA-C   Current Facility-Administered Medications  Medication Dose Route Frequency Provider Last Rate Last Dose  . acetaminophen (TYLENOL) tablet 325-650 mg  325-650 mg Oral Q4H PRN Serafina Mitchell, MD       Or  . acetaminophen (TYLENOL) suppository 325-650 mg  325-650 mg Rectal Q4H PRN Serafina Mitchell, MD      . amLODipine (NORVASC) tablet 10 mg  10 mg Oral Daily Rhonda G Barrett, PA-C   10 mg at 01/11/14 1400  . aspirin chewable tablet 81 mg  81 mg Oral Daily Larey Dresser, MD   81 mg at 01/11/14 1450  . atorvastatin (LIPITOR) tablet 40 mg  40 mg Oral q1800 Larey Dresser, MD      . Derrill Memo ON 01/12/2014] calcitRIOL (ROCALTROL) capsule 1 mcg  1 mcg Oral Q T,Th,Sa-HD Alvia Grove, PA-C      . calcium acetate (PHOSLO) capsule 1,334 mg  1,334 mg Oral PRN Serafina Mitchell, MD      . calcium acetate (PHOSLO) capsule 2,001 mg  2,001 mg Oral TID WC Serafina Mitchell, MD      . carvedilol (COREG) tablet 6.25 mg  6.25 mg Oral BID WC Rhonda G Barrett, PA-C   6.25 mg at 01/11/14 1359  . cinacalcet (SENSIPAR) tablet 60 mg  60 mg Oral Q supper Serafina Mitchell, MD      . colchicine tablet 0.6 mg  0.6 mg Oral Daily Serafina Mitchell, MD   0.6 mg at 01/11/14 1449  . guaiFENesin-dextromethorphan (ROBITUSSIN DM) 100-10 MG/5ML syrup 15 mL  15 mL Oral Q4H PRN Serafina Mitchell, MD      .  labetalol (NORMODYNE,TRANDATE) injection 10 mg  10 mg Intravenous Q2H PRN Serafina Mitchell, MD   10 mg at 01/11/14 1151   Or  . hydrALAZINE (APRESOLINE) injection 10 mg  10 mg Intravenous Q2H PRN Serafina Mitchell, MD   10 mg at 01/11/14 1243  . loperamide (IMODIUM) capsule 2 mg  2 mg Oral Daily PRN Serafina Mitchell, MD      . metoprolol (LOPRESSOR) injection 2-5 mg  2-5 mg Intravenous Q2H PRN Serafina Mitchell, MD   5 mg at 01/11/14 1229  . morphine 2 MG/ML injection 2-5 mg  2-5 mg Intravenous Q1H PRN Serafina Mitchell, MD      . multivitamin (RENA-VIT) tablet 1 tablet  1 tablet Oral Daily Serafina Mitchell, MD   1 tablet at 01/11/14 1448  . nitroGLYCERIN 0.2 mg/mL in dextrose 5 % infusion  2-200 mcg/min Intravenous Titrated Rhonda G Barrett, PA-C 1.5 mL/hr at 01/11/14 1520 5 mcg/min at 01/11/14 1520  . ondansetron (ZOFRAN) injection 4 mg  4 mg Intravenous Q6H PRN Serafina Mitchell, MD      . oxyCODONE (Oxy IR/ROXICODONE) immediate release tablet 5-10 mg  5-10 mg Oral Q4H PRN Serafina Mitchell, MD      . pantoprazole (PROTONIX) EC tablet 40 mg  40 mg Oral Daily Serafina Mitchell, MD   40 mg at 01/11/14 1400  . phenol (CHLORASEPTIC) mouth spray 1 spray  1 spray Mouth/Throat PRN Serafina Mitchell, MD      . predniSONE (DELTASONE) tablet 5 mg  5 mg Oral Q breakfast Serafina Mitchell, MD   5 mg at 01/11/14 1449  . tirofiban (AGGRASTAT) bolus via infusion 1,185 mcg  25 mcg/kg Intravenous Once [employer], RPH       . tirofiban (AGGRASTAT) infusion 50 mcg/mL 100 mL  0.075 mcg/kg/min Intravenous Continuous Nita Denyse Dago, [facility]       Facility-Administered Medications Ordered in Other Encounters  Medication Dose Route Frequency Provider Last Rate Last Dose  . fentaNYL (SUBLIMAZE) injection   Intravenous PRN Carylon Perches, MD   25 mcg at 05/05/12 1544  . midazolam (VERSED) 5 MG/5ML injection   Intravenous PRN Carylon Perches, MD   2 mg at 05/05/12 1544   Labs: Basic Metabolic Panel:  Recent Labs Lab 01/05/14 0241 01/11/14 0656  NA 137 141  K 3.7 4.3  CL 95* 100  CO2 25  --   GLUCOSE 74 78  BUN 25* 30*  CREATININE 6.48* 6.40*  CALCIUM 8.6  --   PHOS 3.4  --    Liver Function Tests:  Recent Labs Lab 01/05/14 0241  ALBUMIN 2.8*   CBC:  Recent Labs Lab 01/05/14 0241 01/11/14 0656  WBC 8.5  --   HGB 12.2* 14.3  HCT 37.0* 42.0  MCV 86.4  --   PLT 124*  --    CBG:  Recent Labs Lab 01/05/14 0740  GLUCAP 78   Studies/Results: No results found.  ROS: Mild pain to bilateral toes stated as much better than in previous weeks. 10 pt ROS asked and answered. All other systems negative except as above.   Physical Exam: Filed Vitals:   01/11/14 1430 01/11/14 1445 01/11/14 1500 01/11/14 1515  BP: 160/67 142/65 137/64 116/42  Pulse: 92 90 87 86  Temp:      TempSrc:      Resp: 18 14 12 15   Height:      Weight:      SpO2:  100% 100% 100% 99%     General: Extremely thin, alert, conversant, in no acute distress. Head: Normocephalic, atraumatic, sclera non-icteric, mucus membranes are moist Neck: Supple. JVD not elevated. Lungs: Clear bilaterally to auscultation without wheezes, rales, or rhonchi. Breathing is unlabored. Heart: RRR with S1 S2. No murmurs, rubs, or gallops appreciated. Abdomen: Soft, non-tender, non-distended with normoactive bowel sounds. No rebound/guarding. No obvious abdominal masses. M-S:  Strength and tone appear normal for age. Lower extremities: No LE  edema. Dry gangrene to Rt 1st/2nd toes and Lt 1st toe.  Neuro: Alert and oriented X 3. Moves all extremities spontaneously. Psych:  Responds to questions appropriately with a normal affect. Dialysis Access: L thigh AVG + bruit  Dialysis Orders: Center: [facility]  on TTS . EDW 47kg HD Bath 2K/2Ca  Time 3:30 Heparin 4000. Access L thigh AVG BFR 400 DFR 800   Calcitriol 1.0 mcg PO/HD Epogen 0   Units IV/HD  Venofer  0  Recent Labs: Hgb 12.1, Tsat 38%, Phos 4.6, PTH 381  Assessment/Plan: 1. Chest Pain - Mgmt per cardiology. Left heart cath planned tomorrow. 2. PAD/ Dry gangrene - s/p revascularization to bilateral LEs (Lt 4/14, Rt 4/22). On Aggrastat for ? Rt foot microemboli. No heparin HD.  3. ESRD -  TTS, K+ 4.3, HD tomorrow 4. Hypertension/volume  - SBPs initially 160s-240s, now 110s-130s on nitro drip. Amlodipine increased to 10 QD and Coreg 6.25 BID added per Cardiology. Close to EDW.  HD tomorrow in coordination with procedure 5. Anemia  - Hgb at goal. Last Tsat 38%. No op ESAs or IV Fe 6. Metabolic bone disease -  Ca/Phos controlled op. Last PTH 381. Continue binders, sensipar and calcitriol 7. Malnutrition - Last Alb 2.8. On clears. Renal diet when appropriate, multivit, supplements 8. Hx renal transplant - Re-txp eval was in progress. Pt now deemed too high risk. 9. Hepatitis C  Sonnie Alamo, PA-C [facility] Pager [phone] 01/11/2014, 3:51 PM    Pt seen, examined and agree w A/P as above.  [name] MD pager [phone]    cell [phone] 01/11/2014, 5:22 PM

## 2014-01-11 NOTE — Op Note (Signed)
Patient name: [name] MRN: [id number] DOB: 1961-05-10 Sex: male  01/11/2014 Pre-operative Diagnosis: Right leg ulcer Post-operative diagnosis:  Same Surgeon:  Serafina Mitchell Procedure Performed:  1.  ultrasound-guided access, left femoral dialysis graft  2.  atherectomy  right popliteal artery (above and below knee)   3. Drug coated balloon angioplasty, right popliteal artery (above and below knee)  4.  angioplasty right dorsalis pedis artery (anterior tibial artery)  5.  intra-arterial administration of nitroglycerin   Indications:  The patient suffers from end-stage renal disease.  He has bilateral rest pain and ulceration.  Last week he underwent interventional left leg.  He is back today for treatment of the right leg.  Procedure:  The patient was identified in the holding area and taken to room 8.  The patient was then placed supine on the table and prepped and draped in the usual sterile fashion.  A time out was called.  Ultrasound was used to evaluate the right femoral dialysis graft artery.  It was patent .  A digital ultrasound image was acquired.  A micropuncture needle was used to access the right common femoral dialysis graft.  A micropuncture sheath was placed followed by an 035 wire.  A 7 French sheath was then inserted.  Using the Omni flush catheter and a Benson wire, the aortic bifurcation was crossed.  A 7 French Ansel 1 sheath was then advanced over the bifurcation and into the right superficial femoral artery.  The patient was then fully heparinized.  I used a 014 cougar wire and a quick cross catheter to cross the lesion within the popliteal artery.  The wire was advanced into the posterior tibial artery.  I then proceeded with atherectomy using the CSI orbital atherectomy device, I used a 2.0 solid crown.  I made 2 passes at low median and high speed.  Next, I proceeded with drug coated balloon angioplasty using a 4 x 100 Lutonix balloon.  The balloon was taken up for 3  minutes.  During the intervention, the patient began complaining of pain in his right foot.  While balloon angioplasty was being performed, I injected contrast as well as nitroglycerin.  This revealed sluggish flow but I did not see any acute embolic process.  Once I was done with balloon angioplasty, I took an image of the treated area within the popliteal artery.  There was an excellent result at this area.  I then selected a 035 Navi-cross catheter and advanced this into the anterior tibial artery.  Selective imaging was performed which showed a high-grade stenosis at the ankle.  This was unchanged from the diagnostic imaging last week.  I did not see any acute embolic events, however I suspect that given the patient's forefoot is perfused via small collaterals that he may have had microemboli.  I elected to treat this 85% lesion at the ankle.  A 2 mm Fox SV balloon was used to perform balloon angioplasty within the dorsalis pedis (anterior tibial artery).  Completion imaging revealed excellent blood flow out onto the foot which was improved from preop.  The patient stated that his pain in his foot was improving.  At this point catheters and wires were removed.  I elected to suture close the cannulation site within the femoral graft on the left.  Patient tolerated the procedure well there no immediate complications.   Impression:  #1  successful atherectomy with drug coated balloon angioplasty of the right popliteal artery, both above and below  the knee.  I utilized a CSI 2.0 solid brown device and a 4 x 10 Lutonix balloon.  #2  successful angioplasty of a 90% lesion within the dorsalis pedis artery on the right.  This was done using a 2 mm balloon.  #3  the patient has extensive disease out onto his foot.   Theotis Burrow, M.D. [facility] Office: [phone] Pager:  [phone]

## 2014-01-11 NOTE — Progress Notes (Signed)
Contacted pharmacy regarding aggrastat bolus - confirmed bolus amount and delivery from bag and that it is compatible with nitro gtt.  Had [place], RN verify gtt weight, bolus and continuous rate of aggrastat.  Pt currently resting with eyes closed.  No c/o pain or discomfort.  Will continue to closely monitor.

## 2014-01-11 NOTE — Progress Notes (Signed)
2 RN's accessed pt for 2nd IV.  Veins roll then blow.  Unable to obtain 2nd site. Will notify IV team.  [name]

## 2014-01-12 ENCOUNTER — Encounter (HOSPITAL_COMMUNITY): Payer: Self-pay | Admitting: Nephrology

## 2014-01-12 ENCOUNTER — Encounter (HOSPITAL_COMMUNITY)
Admission: RE | Disposition: A | Discharge status: Home / Self Care | Payer: Self-pay | Source: Ambulatory Visit | Attending: Cardiology

## 2014-01-12 DIAGNOSIS — Z992 Dependence on renal dialysis: Secondary | ICD-10-CM

## 2014-01-12 DIAGNOSIS — N186 End stage renal disease: Secondary | ICD-10-CM

## 2014-01-12 DIAGNOSIS — I2510 Atherosclerotic heart disease of native coronary artery without angina pectoris: Secondary | ICD-10-CM

## 2014-01-12 HISTORY — PX: LEFT HEART CATHETERIZATION WITH CORONARY ANGIOGRAM: SHX5451

## 2014-01-12 LAB — BASIC METABOLIC PANEL
BUN: 49 mg/dL — AB (ref 6–23)
CALCIUM: 8.4 mg/dL (ref 8.4–10.5)
CO2: 23 mEq/L (ref 19–32)
Chloride: 100 mEq/L (ref 96–112)
Creatinine, Ser: 7.89 mg/dL — ABNORMAL HIGH (ref 0.50–1.35)
GFR, EST AFRICAN AMERICAN: 8 mL/min — AB (ref >90)
GFR, EST NON AFRICAN AMERICAN: 7 mL/min — AB (ref >90)
GLUCOSE: 105 mg/dL — AB (ref 70–99)
Potassium: 5.3 mEq/L (ref 3.7–5.3)
Sodium: 140 mEq/L (ref 137–147)

## 2014-01-12 LAB — RENAL FUNCTION PANEL
Albumin: 2.6 g/dL — ABNORMAL LOW (ref 3.5–5.2)
BUN: 56 mg/dL — AB (ref 6–23)
CHLORIDE: 98 meq/L (ref 96–112)
CO2: 24 mEq/L (ref 19–32)
Calcium: 7.9 mg/dL — ABNORMAL LOW (ref 8.4–10.5)
Creatinine, Ser: 8.83 mg/dL — ABNORMAL HIGH (ref 0.50–1.35)
GFR calc Af Amer: 7 mL/min — ABNORMAL LOW (ref >90)
GFR calc non Af Amer: 6 mL/min — ABNORMAL LOW (ref >90)
GLUCOSE: 134 mg/dL — AB (ref 70–99)
Phosphorus: 4.7 mg/dL — ABNORMAL HIGH (ref 2.3–4.6)
Potassium: 5.2 mEq/L (ref 3.7–5.3)
Sodium: 138 mEq/L (ref 137–147)

## 2014-01-12 LAB — CBC
HEMATOCRIT: 35.8 % — AB (ref 39.0–52.0)
Hemoglobin: 11.5 g/dL — ABNORMAL LOW (ref 13.0–17.0)
MCH: 28.3 pg (ref 26.0–34.0)
MCHC: 32.1 g/dL (ref 30.0–36.0)
MCV: 88 fL (ref 78.0–100.0)
Platelets: 170 10*3/uL (ref 150–400)
RBC: 4.07 MIL/uL — ABNORMAL LOW (ref 4.22–5.81)
RDW: 14.6 % (ref 11.5–15.5)
WBC: 10.5 10*3/uL (ref 4.0–10.5)

## 2014-01-12 LAB — GLUCOSE, CAPILLARY
Glucose-Capillary: 83 mg/dL (ref 70–99)
Glucose-Capillary: 83 mg/dL (ref 70–99)

## 2014-01-12 LAB — PROTIME-INR
INR: 0.96 (ref 0.00–1.49)
Prothrombin Time: 12.6 seconds (ref 11.6–15.2)

## 2014-01-12 SURGERY — LEFT HEART CATHETERIZATION WITH CORONARY ANGIOGRAM
Anesthesia: LOCAL

## 2014-01-12 MED ORDER — FENTANYL CITRATE 0.05 MG/ML IJ SOLN
INTRAMUSCULAR | Status: AC
  Filled 2014-01-12: qty 2

## 2014-01-12 MED ORDER — HEPARIN (PORCINE) IN NACL 100-0.45 UNIT/ML-% IJ SOLN
800.0000 [IU]/h | INTRAMUSCULAR | Status: DC
  Administered 2014-01-12: 600 [IU]/h via INTRAVENOUS
  Filled 2014-01-12 (×2): qty 250

## 2014-01-12 MED ORDER — MIDAZOLAM HCL 2 MG/2ML IJ SOLN
INTRAMUSCULAR | Status: AC
  Filled 2014-01-12: qty 2

## 2014-01-12 NOTE — CV Procedure (Signed)
CARDIAC CATHETERIZATION REPORT  NAME:  [name]   MRN: [id number] DOB:  [birth year]   ADMIT DATE: 01/11/2014 Procedure Date: 01/12/2014  INTERVENTIONAL CARDIOLOGIST: Leonie Man, M.D., MS PRIMARY CARE PROVIDER: Placido Sou, MD REFERRING PROVIDER: Dr. Trula Slade, Vascular Surgery. PRIMARY CARDIOLOGIST: Dr. Shelva Majestic; Consult by Dr. Loralie Champagne;   PATIENT:  [name] is a 53 y.o. male with severe peripheral vascular disease as well as end-stage renal disease on hemodialysis status post failed transplant. He also has a history of prostate surgery status post surgery, along with hepatitis C and severe malnutrition. The patient underwent angiography with PTA and atherectomy of the left popliteal artery on April 14 and then was readmitted for the similar procedure on the right. He is now status post right popliteal angioplasty as well as right dorsalis pedis angioplasty for dry gangrene. Patient continued to have persistent chest discomfort concerning for at Least Class II to Class III Angina. He was seen in consultation by Dr. Marigene Ehlers, who felt that with the extent of PVD despite having a negative nuclear stress test in the fall last year, the anginal discomfort warranted evaluation with invasive cardiac catheterization to exclude balance ischemia versus new disease. Is now referred for cardiac catheterization plus minus PCI. Interestingly, the patient was started on Aggrastat infusion following the right popliteal procedure, and since that has been running has not had any further chest pain.  PRE-OPERATIVE DIAGNOSIS:    Class 2-3 angina, persistent and accelerating  Severe PVD  PROCEDURES PERFORMED:    Left Heart Catheterization with Coronary Angiography  PROCEDURE:Consent:  Risks of procedure as well as the alternatives and risks of each were explained to the (patient/caregiver).  Consent for procedure obtained. Consent for signed by MD and patient with RN witness -- placed  on chart.   PROCEDURE: The patient was brought to the [facility]nd [facility] in the fasting state and prepped and draped in the usual sterile fashion for Right groin or radial access. Right femoral access was chosen due to the patient being a dialysis patient and recent left femoral access.  Sterile technique was used including antiseptics, cap, gloves, gown, hand hygiene, mask and sheet.  Skin prep: Chlorhexidine.  Time Out: Verified patient identification, verified procedure, site/side was marked, verified correct patient position, special equipment/implants available, medications/allergies/relevent history reviewed, required imaging and test results available.  Performed  Access: Right Common Femoral Artery; 5 Fr Sheath -- fluoroscopically guided modified Seldinger technique   Diagnostic Left Heart Catheterization with Coronary Angiography:  5 Fr JL4, JR 4, Angled Pigtail catheters advanced and exchanged over standard J-wire into the ascending aorta and used for selective coronary artery engagement.  Left Coronary Artery Angiography: JL4  Right Coronary Artery Angiography: JR 4  LV Hemodynamics (LV Gram): Angled pigtail  Sheath:  To be removed in the PACU holding area with manual pressure for hemostasis.   Hemodynamics:  Central Aortic / Mean Pressures: 122/65 mmHg; 88 mmHg  Left Ventricular Pressures / EDP: 122/8 mmHg; 15 mmHg  Left Ventriculography:  EF: 45-50 %  Wall Motion: Mild inferior hypokinesis but otherwise relatively normal  Coronary Anatomy: Right dominant, heavily calcified proximal Left Main- LAD and almost the entire RCA with circumferential calcification  Left Main: Heavily calcified, large-caliber vessel with lumen diameter notably smaller than the calcified segment. There is mild distal calcification but no significant stenosis. LAD: Large-caliber vessel with a very proximal septal perforator. The vessel is heavily calcified to the  first half of the  vessel with diffuse tubular 30-40% stenosis. This dissection normalizes after a large second diagonal branch where there is a 50% stenosis. It then courses down around the apex perfusing the inferoapex. There are several large septal perforator branches and a total of 3 diagonal branches. The distal branches quite tortuous, free of disease  D1: Moderate caliber proximal vessel with several small distal branches. Diffuse mild luminal irregularities.  D2: Moderate to large caliber vessel that takes off from heavily calcified portion of the mid LAD as described above. This vessel is a large portion of the mid inferolateral wall. They're several distal branches. Mild luminal irregularities only.  D3: Smaller moderate caliber, somewhat tortuous vessel that has several small distal branches with only the first proximal segment being smaller moderate caliber. No significant disease.  Left Circumflex: Large-caliber vessel that gives off a very proximal branch that actually appears to be a septal perforator and a ramus distribution there is an ostial calcified at least 50% stenosis but not flow limiting. The vessel is nondominant and gives rise to 2 small AV groove branch is that give collaterals  OM1 & 2: OM1 is a larger the 2 branches being a moderate caliber. At the bifurcation and OM1 on 2 there is a calcified 30-40% stenosis. The 2 vessels basically provide flow to the inferolateral wall   RCA: Large-caliber, heavily calcified dominant vessel with a proximal focal hazy 70% stenosis in the [place] bend followed by diffuse calcified tapered/tubular 70% stenosis. Dr. is short relatively normal segment just following the second marginal branch there is a heavily calcified eccentric/ulcerated plaque of 95% followed by 85% just prior to the genu. The vessel then normalizes somewhat with mild distal disease. He still remains heavily calcified until the bifurcation into the Right  Posterior Ascending Artery (RPDA) and the Right Posterior AV Groove Branch (RPAV).  RPDA: Moderate caliber somewhat tortuous vessel with ostial 50% stenosis. Tapers are relatively small caliber distal vessel.  RPL Sysytem:The RPAV begins as a moderate caliber vessel it branches into 3 posterolateral branches of the mid RPL to being the larger of the 3. There is actually TIMI 2 flow in RPL 3.  After reviewing the initial angiography, the culprit lesion was thought to be the extensive disease in the proximal as well as mid RCA.Marland Kitchen  After discussion with Dr. Burt Knack, the decision was made to stop at this point in order to regroup to planned staged PCI with rotational atherectomy and extensive stenting of the entire proximal/mid RCA.  MEDICATIONS:  Anesthesia:  Local Lidocaine 18 ml  Sedation:   25 mcg IV fentanyl ;   Omnipaque Contrast: 70 ml  Anti-Platelet Agent:  The patient was on Aggrastat infusion during the procedure.  PATIENT DISPOSITION:    The patient was transferred to the PACU holding area in a hemodynamicaly stable, chest pain free condition.  The patient tolerated the procedure well, and there were no complications.  EBL:   < 10 ml  The patient was stable before, during, and after the procedure.  POST-OPERATIVE DIAGNOSIS:    Severe single-vessel disease involving the entire proximal to midportion of the RCA there is heavily calcified. Otherwise moderate disease In the Left Coronary System.  Potentially mildly reduced EF, with slightly elevated LVEDP  PLAN OF CARE:  Simply based on the extent of disease in the likely difficulty of the PCI in question, the decision was made to stage rotational atherectomy and PCI procedure potential he tomorrow at noon by Dr. Darlina Guys.  We will continue Aggrastat drip per vascular surgery recommendations, and we'll load with oral Plavix today  Patient with severe nature of the RCA disease with TIMI 2 flow in the posterior lateral  branch, we will start heparin infusion as well to prevent complete occlusion of the RCA.  Continue other cardiac medications.  I have updated the artery at the consult service as well as Dr. Trula Slade from Vascular Surgery.  Leonie Man, M.D., M.S. [facility] [address] [place], [place]  [address]  [phone]  01/12/2014 10:18 AM

## 2014-01-12 NOTE — Progress Notes (Signed)
Notified Md about pt's med.aggrastate infusing. Need Clarification to keep running or d/c before cath.  Awaiting orders. Vs stable will continue to monitor. [name]

## 2014-01-12 NOTE — Procedures (Signed)
I was present at this dialysis session, have reviewed the session itself and made  appropriate changes  [name] MD (pgr) [phone]    ([phone] 01/12/2014, 2:04 PM

## 2014-01-12 NOTE — Progress Notes (Signed)
Re-paged Md about pt's med infusing. Need to clarify to keep on or d/c before heart cath procedure.  Md to call [facility] RN. Wynona Canes

## 2014-01-12 NOTE — Interval H&P Note (Signed)
History and Physical Interval Note:  01/12/2014 9:14 AM  Burna Cash  has presented today for surgery, with the diagnosis of CP - with moderate risk of Cardiac Etiology (Angina Class II-III)  The various methods of treatment have been discussed with the patient and family. After consideration of risks, benefits and other options for treatment, the patient has consented to  Procedure(s): LEFT HEART CATHETERIZATION WITH CORONARY ANGIOGRAM (N/A) +/- PCI as a surgical intervention .    The patient's history has been reviewed, patient examined, no change in status, stable for surgery.  I have reviewed the patient's chart and labs.  Questions were answered to the patient's satisfaction.     Leonie Man  Cath Lab Visit (complete for each Cath Lab visit)  Clinical Evaluation Leading to the Procedure:   ACS: no  Non-ACS:    Anginal Classification: CCS III  Anti-ischemic medical therapy: Maximal Therapy (2 or more classes of medications)  Non-Invasive Test Results: Low-risk stress test findings: cardiac mortality <1%/year; No ischemia or infarction, EF ~48%, but persistent pain.  Prior CABG: No previous CABG

## 2014-01-12 NOTE — H&P (View-Only) (Signed)
CARDIOLOGY CONSULT NOTE   Patient ID: [name] MRN: [id number] DOB/AGE: 04/16/1961 53 y.o.  Admit date: 01/11/2014  Primary Physician   DETERDING,JAMES L, MD Primary Cardiologist  Dr. Claiborne Billings Reason for Consultation   Chest pain, abnl ECG  KGM:WNUUV [name] is a 53 y.o. male with no history of CAD. 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   He was hospitalized 04/14-04/16 when he was admitted for PV angiogram and had PTA with atherectomy to the left popliteal artery. He was seen by cardiology that admission for chest pain, an echocardiogram was performed with normal LV function, results below.  He came back to the hospital for PV intervention on his right popliteal. He had atherectomy x 2 to the right popliteal, balloon angioplasty x 2 to the right popliteal and angioplasty to the right dorsalis pedis (anterior tibial artery).  Post-procedure, he developed chest pain, in the setting of SBP > 240, ECG is abnormal and cardiology was asked to evaluate.   [name] has had chest pain about 10 times total. The episodes start with and without exertion. They are not associated with SOB, N&V or diaphoresis. There is sometimes an associated headache, but not always. He will lie down and close his eyes and the symptoms will resolve in less than 1 hour. The intensity is an 8-9/10. He has not tried any medications. The pain does not consistently occur in association with HD. The episode today was the first episode since his previous admission. Currently he is resting comfortably.  Stress GXT test performed 06/10/2013 at [facility], heart did not reach target, nondiagnostic. Lexiscan nuclear test 06/24/2013 showed normal perfusion, no scar or ischemia, EF 48%.   His last office visit for transplant status was 04/08/215: [name] is a 57 y.o. African American male with a  history of end-stage renal disease as a result of hypertension who first underwent cadaveric renal transplant in 1987 with return to dialysis in 2004. He has been on our list for re-transplantation since 2006 with a period of in-activation for a year due to financial issues. He remained active from 2008 until July 2011 he was in-activated again due to the diagnosis of Prostate Cancer. He underwent surgical excision and waiting period per protocol with subsequent hospitalization due to small bowel obstruction in 2013 with resection of a portion of bowel with primary renastamosis but developed wound dehiscence requiring another operation in August 2013, and ultimately TPN via a right femoral line. During that time he was hospitalized for extensive period of time and developed significant weight loss and nutritional deficiencies. He was last seen in our clinic by Dr. Tamala Julian in October 2014 with recommendation to remain inactive because of low BMI and need for evaluation of his Hepatitis C. Since then, he has had no intercurrent hospitalizations or major illnesses. He was evaluated by Dr. Loletha Grayer in our hepatology Clinic earlier today with a plan to re-evaluate in 6 months.  Unfortunately, he has had progressive lower extremity pain and now with some evidence of vascular compromise and possible dry gangrene requiring initiation levofloxacin yesterday. He is also scheduled for LE angiography at [facility] on 01/03/14. He has had dry lesions on the L 1st/2nd digits and R 1-3 with involvement of the 1st metatarsal. He denies any active fevers chills erythema redness. His weekly dialysis continues to proceed without complications, Tuesday Thursday Saturday  schedule via left thigh graft for 3.5 hr. His weight remains low at approximately 107 lb corresponding to a BMI of ~15. He states that he is unable to increase his intake given the severity of his pain for which he takes intermittent oxycodone and Tylenol. He is  accompanied by his sister who helps to takes care of him. Assessment: [name] is still too high of a risk for transplantation at this time, especially in light of the lower extremity dry gangrene . He needs to continue to gain weight to a BMI of 17 - 18. He also needs an additional period absent of any complications. His waiting time is long so that if we can get him into transplantable condition it will likely occur quickly. However this will take some time even in the best case scenario.   Once his nutrition and lower extremity vascular issues are resolved, he will likely need a follow up nuclear perfusion stress test (last 10/14) as well as reassessment in the liver clinic. Abd CT revealed severe atherosclerotic calcifications visualized throughout the abdominal aorta as well as within nearly all branch vessels. Of note, the right external iliac artery is relatively free of atherosclerotic calcifications with mild atherosclerotic calcifications visualized within the distal portion of the right external iliac artery. He will also need follow up evaluation of his hepatitis C and liver, with possible biopsy (last bx 5/08 grade 2 activity and stage 2 fibrosis).  Plan: The risks of transplantation including the risk of life-threatening infection, the slightly increased risk of malignancy, and the possible side effects of the immunosuppressant agents were described in detail. The expected results of transplantation in terms of graft survival were discussed for both a living donor and a deceased donor transplant. The average expected survival benefit of transplantation was also discussed.  We will proceed with the workup as described in the "Assessment" Section above. This was discussed with the transplant coordinator at the conclusion of the visit.   Past Medical History  Diagnosis Date  . Prostate cancer   . Hypertension   . Renal transplant disorder 1997    Now HD   . Gout tophi   . Chronic steroid  use     Has severe gout. Did not tolerate Allopurinol. Do not taper per PCP   . Systolic CHF, chronic     2 D echo 04/2012 with EF of 45 %   . Malnutrition   . 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 F/U with Dr Donne Hazel. Started on TNA  . Anemia associated with chronic renal failure   . Hepatitis C   . Heart murmur   . ESRD needing dialysis     [facility]  . GERD (gastroesophageal reflux disease)   . H/O hiatal hernia   . Arthritis   . DVT (deep venous thrombosis)   . Peripheral vascular disease     Past Surgical History  Procedure Laterality Date  . Laparotomy  04/14/2012    Procedure: EXPLORATORY LAPAROTOMY;  Surgeon: Joyice Faster. Cornett, MD;  Location: [facility];  Service: General;  Laterality: N/A;  . Colon resection  04/14/2012  . Laparotomy  04/22/2012    Procedure: EXPLORATORY LAPAROTOMY;  Surgeon: Adin Hector, MD;  Location: [facility];  Service: General;  Laterality: N/A;  lysis of adhesions, omentoplasty, repair small bowel  . Thrombectomy w/ embolectomy  04/27/2012    Procedure: THROMBECTOMY  ARTERIOVENOUS GORE-TEX GRAFT;  Surgeon: Angelia Mould, MD;  Location: [facility];  Service: Vascular;  Laterality: Left;  Thrombectomy of left thigh arteriovenous gortex graft  . Prostectomy  2011  . Renal grafts    . Pelvic abcess drainage Right 6/14    removal drain s/p bowl resection 13  . Transanal excision of rectal mass N/A 03/30/2013    Procedure: EXCISION OF anal MASS;  Surgeon: Joyice Faster. Cornett, MD;  Location: [facility];  Service: General;  Laterality: N/A;  Exam under anesthesia with excision anal verge mass    Allergies  Allergen Reactions  . Allopurinol Other (See Comments)    REACTION: decreased platelets  . Aspirin Other (See Comments)    REACTION: unknown reaction    I have reviewed the patient's current medications . amLODipine  10 mg Oral Daily  . calcium acetate   2,001 mg Oral TID WC  . carvedilol  6.25 mg Oral BID WC  . cinacalcet  60 mg Oral Q supper  . colchicine  0.6 mg Oral Daily  . multivitamin  1 tablet Oral Daily  . pantoprazole  40 mg Oral Daily  . predniSONE  5 mg Oral Q breakfast   . nitroGLYCERIN 20 mcg/min (01/11/14 1329)   acetaminophen, acetaminophen, alum & mag hydroxide-simeth, calcium acetate, guaiFENesin-dextromethorphan, hydrALAZINE, labetalol, loperamide, metoprolol, morphine injection, ondansetron, oxyCODONE, phenol  Medication Sig  amLODipine (NORVASC) 5 MG tablet Take 1 tablet (5 mg total) by mouth daily.  calcium acetate (PHOSLO) 667 MG capsule Take 1,334 mg by mouth as needed (with snacks).  calcium acetate (PHOSLO) 667 MG tablet Take 2,001 mg by mouth 3 (three) times daily.   cinacalcet (SENSIPAR) 60 MG tablet Take 60 mg by mouth daily.  COLCRYS 0.6 MG tablet Take 0.6 mg by mouth daily.   loperamide (IMODIUM A-D) 2 MG tablet Take 2 mg by mouth daily as needed. For diarrhea  multivitamin (RENA-VIT) TABS tablet Take 1 tablet by mouth daily.  omeprazole (PRILOSEC) 40 MG capsule Take 40 mg by mouth daily.   oxyCODONE-acetaminophen (PERCOCET/ROXICET) 5-325 MG per tablet Take 1 tablet by mouth every 6 (six) hours as needed. For pain  predniSONE (DELTASONE) 5 MG tablet Take 5 mg by mouth daily.  levofloxacin (LEVAQUIN) 250 MG tablet Take 1 tablet (250 mg total) by mouth daily.  LIDOCAINE EX Apply 1 application topically See admin instructions. Apply to inject site on dialysis days (Tues, Thur, Sat) if needed for pain  metoprolol tartrate (LOPRESSOR) 12.5 mg TABS tablet Take 0.5 tablets (12.5 mg total) by mouth 2 (two) times daily.     History   Social History  . Marital Status: Single    Spouse Name: N/A    Number of Children: N/A  . Years of Education: N/A   Occupational History  . Not on file.   Social History Main Topics  . Smoking status: Former Smoker -- 1.00 packs/day for 10 years    Types: Cigarettes     Quit date: 03/23/1973  . Smokeless tobacco: Not on file     Comment: Quit 30 years ago  . Alcohol Use: No  . Drug Use: No  . Sexual Activity: No   Other Topics Concern  . Not on file   Social History Narrative   Currently in the nursing home until end of the month then will go to live with sister.           Family Status  Relation Status Death Age  . Mother Deceased   .  Father Deceased     MI  . Sister Alive   . Brother Deceased 22   Family History  Problem Relation Age of Onset  . Hypertension Father   . Pneumonia Brother   . Lung disease Brother      ROS:  Full 14 point review of systems complete and found to be negative unless listed above.  Physical Exam: Blood pressure 185/76, pulse 95, temperature 98.8 F (37.1 C), temperature source Oral, resp. rate 17, height 5\' 11"  (1.803 m), weight 104 lb 8 oz (47.4 kg), SpO2 100.00%.  General: Well developed, frail-appearing male in no acute distress Head: Eyes PERRLA, No xanthomas.   Normocephalic and atraumatic, oropharynx without edema or exudate. Dentition: poor  Lungs: rales bases Heart: HRRR S1 S2, no rub/gallop, 2/6 murmur. pulses are decreased in all extrem due to former HD sites in both UE and PVD in LE.  Neck: No carotid bruits. No lymphadenopathy.  JVD at 8 cm Abdomen: Bowel sounds present, abdomen soft and non-tender without masses or hernias noted. Msk:  No spine or cva tenderness. Generalized weakness, no joint deformities or effusions. Extremities: No clubbing or cyanosis. no edema.  Neuro: Alert and oriented X 3. No focal deficits noted. Psych:  Good affect, responds appropriately Skin: Both LE with dry scaly skin. Ischemic changes with possible Dry gangrene noted on R 1st & 2nd toes and L 1st toe   Labs:   Lab Results  Component Value Date   WBC 8.5 01/05/2014   HGB 14.3 01/11/2014   HCT 42.0 01/11/2014   MCV 86.4 01/05/2014   PLT 124* 01/05/2014     Recent Labs Lab 01/05/14 0241 01/11/14 0656  NA  137 141  K 3.7 4.3  CL 95* 100  CO2 25  --   BUN 25* 30*  CREATININE 6.48* 6.40*  CALCIUM 8.6  --   GLUCOSE 74 78  ALBUMIN 2.8*  --    Lab Results  Component Value Date   CHOL 121 01/04/2014   HDL 68 01/04/2014   LDLCALC 26 01/04/2014   TRIG 137 01/04/2014   TSH  Date/Time Value Ref Range Status  07/01/2012  4:30 PM 0.261* 0.350 - 4.500 uIU/mL Final   Echo: 01/05/2014 Study Conclusions Left ventricle: The cavity size was normal. Wall thickness was increased in a pattern of mild LVH. Systolic function was normal. The estimated ejection fraction was in the range of 55% to 60%. Wall motion was normal; there were no regional wall motion abnormalities. Features are consistent with a pseudonormal left ventricular filling pattern, with concomitant abnormal relaxation and increased filling pressure (grade 2 diastolic dysfunction). Doppler parameters are consistent with high ventricular filling pressure.  ECG:  01/11/2014 SR, LVH Vent. rate 74 BPM PR interval 172 ms QRS duration 82 ms QT/QTc 438/486 ms P-R-T axes 100 45 72  Radiology:  No results found.  ASSESSMENT AND PLAN:   The patient was seen today by Dr. Aundra Dubin, the patient evaluated and the data reviewed.  Active Problems:   HTN (hypertension)   PAD (peripheral artery disease)   Angina decubitus - cath scheduled at 8 am, tomorrow   ESRD on HD - Dr. Jonnie Finner aware, will see pt and manage HD.   Signed: Lonn Georgia, PA-C 01/11/2014 2:10 PM Beeper [id number]  Co-Sign MD  Patient seen with PA, agree with the above note. He had an episode of recurrent chest pain today post-peripheral intervention.  BP was very high as well.  1. Chest pain: Multiple  episodes now.  Some at rest and some in setting of exertion.  CP today in the setting of very elevated blood pressure.  Many risk factors for CAD (PAD, HTN, ESRD).  Additionally, he is undergoing workup for a renal transplant.  I think that we probably need to put this  issue to rest, so will arrange for left heart catheterization tomorrow.  Continue ASA, will add statin.  2. HTN: BP very high, hypertensive emergency given chest pain concerning for angina.  Will control with NTG gtt.  Will also increase amlodipine to 10 mg daily and add Coreg 6.25 mg bid.  3. PAD: s/p peripheral intervention by Dr. Trula Slade. Care per [facility].  4. ESRD: HD per renal service.   Larey Dresser 01/11/2014 2:26 PM

## 2014-01-12 NOTE — Progress Notes (Addendum)
[facility]  Subjective  - Doing well. Pending heart cath today.   Objective 134/63 80 98.5 F (36.9 C) (Oral) 10 99%  Intake/Output Summary (Last 24 hours) at 01/12/14 0806 Last data filed at 01/12/14 0630  Gross per 24 hour  Intake 488.24 ml  Output      0 ml  Net 488.24 ml    Left dressing intact without active bleeding Doppler signals AT/PT BIl.  Assessment/Planning: POD #1  Procedure Performed:  1. ultrasound-guided access, left femoral dialysis graft  2. atherectomy right popliteal artery (above and below knee)  3. Drug coated balloon angioplasty, right popliteal artery (above and below knee)  4. angioplasty right dorsalis pedis artery (anterior tibial artery)  5. intra-arterial administration of nitroglycerin  Post-procedure, he developed chest pain, in the setting of SBP > 240, ECG is abnormal and cardiology was asked to evaluate.   Malnutrition  INTERVENTION:  1. Supplements; Prostat BID, each supplement provides 100 kcal, 15g protein.  2. General healthful diet; encourage intake of foods and beverages as able. RD to follow and assess for nutritional adequacy.       Ulyses Amor 01/12/2014 8:06 AM --  Laboratory Lab Results:  Recent Labs  01/11/14 2158 01/12/14 0321  WBC 7.0 10.5  HGB 11.4* 11.5*  HCT 36.4* 35.8*  PLT 147* 170   BMET  Recent Labs  01/11/14 0656 01/12/14 0321  NA 141 140  K 4.3 5.3  CL 100 100  CO2  --  23  GLUCOSE 78 105*  BUN 30* 49*  CREATININE 6.40* 7.89*  CALCIUM  --  8.4    COAG Lab Results  Component Value Date   INR 0.96 01/12/2014   INR 1.18 06/03/2012   INR 1.17 05/13/2012   No results found for this basename: PTT      I agree with the above.  Appreciate cardiology assistance  [name]

## 2014-01-12 NOTE — Progress Notes (Signed)
ANTICOAGULATION CONSULT NOTE - Follow Up Consult  Pharmacy Consult for Heparin Indication: RCA disease  Allergies  Allergen Reactions  . Allopurinol Other (See Comments)    REACTION: decreased platelets  . Aspirin Other (See Comments)    REACTION: unknown reaction    Patient Measurements: Height: 5\' 11"  (180.3 cm) Weight: 106 lb 0.7 oz (48.1 kg) IBW/kg (Calculated) : 75.3 Heparin Dosing Weight: 48kg  Vital Signs: Temp: 97.4 F (36.3 C) (04/23 1244) Temp src: Oral (04/23 1244) BP: 113/58 mmHg (04/23 1330) Pulse Rate: 71 (04/23 1330)  Labs:  Recent Labs  01/11/14 0656 01/11/14 2158 01/12/14 0321  HGB 14.3 11.4* 11.5*  HCT 42.0 36.4* 35.8*  PLT  --  147* 170  LABPROT  --   --  12.6  INR  --   --  0.96  CREATININE 6.40*  --  7.89*    Estimated Creatinine Clearance: 7.3 ml/min (by C-G formula based on Cr of 7.89).   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 He will need a staged rotational atherectomy. Heparin will be started this evening until PCI tomorrow. He is also on aggrastat per [facility] s/p multiple vascular procedures on his lower extremities. Will aim for a lower heparin goal given increased bleeding risk with a GPIIbIIIa inhibitor.  Heparin to resume 8h post-sheath removal. Sheath removed at 1101.  Goal of Therapy:  Heparin level 0.3-0.5 units/ml Monitor platelets by anticoagulation protocol: Yes   Plan:  1) At 1900, begin heparin at 600 units/hr with no bolus 2) Check 8 hour heparin level 3) Daily heparin level and CBC  Benjamine Sprague [facility] 01/12/2014,2:06 PM

## 2014-01-12 NOTE — Progress Notes (Signed)
INITIAL NUTRITION ASSESSMENT  DOCUMENTATION CODES Per approved criteria  -Severe malnutrition in the context of chronic illness   INTERVENTION: 1.  Supplements; Prostat BID, each supplement provides 100 kcal, 15g protein.  2.  General healthful diet; encourage intake of foods and beverages as able.  RD to follow and assess for nutritional adequacy.   NUTRITION DIAGNOSIS: Malnutrition related to chronic illness as evidenced by nutrition focused physical exam.   Monitor:  1.  Food/Beverage; pt meeting >/=90% estimated needs with tolerance. 2.  Wt/wt change; monitor trends  Reason for Assessment: MST  53 y.o. male  Admitting Dx: chest pain  ASSESSMENT: Pt admitted with s/p right popliteal artery angioplasty and right dorsalis pedis angioplasty for dry gangrene.  Pt familiar to this RD from recent admission.  At that time pt admitted to eating less than his usual PTA due to foot pain.  Pt is not available during visit today- off unit for HD. During last visit, pt with moderate-severe muscle and fat mass depletion which is likely ongoing due to ongoing weight loss. Pt was willing to try Prostat daily at last assessment, will resume.  Pt also on renal vitamin.   RD did observe tray at bedside after lunch today with 75% of meal consumed.   Pt meets criteria for severe MALNUTRITION in the context of chronic as evidenced by moderate-severely depleted muscle and fat mass.  Height: Ht Readings from Last 1 Encounters:  01/11/14 5\' 11"  (1.803 m)    Weight: Wt Readings from Last 1 Encounters:  01/12/14 103 lb 2.8 oz (46.8 kg)    Ideal Body Weight: 78.1 lg  % Ideal Body Weight: 63.6%  Wt Readings from Last 10 Encounters:  01/12/14 103 lb 2.8 oz (46.8 kg)  01/12/14 103 lb 2.8 oz (46.8 kg)  01/12/14 103 lb 2.8 oz (46.8 kg)  01/05/14 98 lb 15.8 oz (44.9 kg)  01/05/14 98 lb 15.8 oz (44.9 kg)  12/26/13 108 lb 3.2 oz (49.079 kg)  09/02/13 106 lb 6.4 oz (48.263 kg)  07/11/13 109 lb  (49.442 kg)  04/18/13 109 lb 6.4 oz (49.624 kg)  03/23/13 107 lb 11.2 oz (48.852 kg)    Usual Body Weight: 105-110 lbs  % Usual Body Weight: 100%  BMI:  Body mass index is 14.4 kg/(m^2).  Estimated Nutritional Needs: Kcal: 1500-1750 Protein: 75-90g Fluid: per MD  Skin: intact  Diet Order: Renal  EDUCATION NEEDS: -Education needs addressed   Intake/Output Summary (Last 24 hours) at 01/12/14 1309 Last data filed at 01/12/14 1216  Gross per 24 hour  Intake 697.44 ml  Output      0 ml  Net 697.44 ml    Last BM: 4/21  Labs:   Recent Labs Lab 01/11/14 0656 01/12/14 0321  NA 141 140  K 4.3 5.3  CL 100 100  CO2  --  23  BUN 30* 49*  CREATININE 6.40* 7.89*  CALCIUM  --  8.4  GLUCOSE 78 105*    CBG (last 3)   Recent Labs  01/12/14 0748 01/12/14 1147  GLUCAP 83 83    Scheduled Meds: . amLODipine  10 mg Oral Daily  . aspirin  81 mg Oral Daily  . atorvastatin  40 mg Oral q1800  . calcitRIOL  1 mcg Oral Q T,Th,Sa-HD  . calcium acetate  2,001 mg Oral TID WC  . carvedilol  6.25 mg Oral BID WC  . cinacalcet  60 mg Oral Q supper  . colchicine  0.6 mg Oral Daily  .  feeding supplement (PRO-STAT SUGAR FREE 64)  30 mL Oral Q2000  . multivitamin  1 tablet Oral Daily  . pantoprazole  40 mg Oral Daily  . predniSONE  5 mg Oral Q breakfast  . sodium chloride  3 mL Intravenous Q12H    Continuous Infusions: . sodium chloride 10 mL (01/12/14 0506)  . nitroGLYCERIN 10 mcg/min (01/12/14 0506)  . tirofiban 0.075 mcg/kg/min (01/12/14 0446)    Past Medical History  Diagnosis Date  . ESRD on hemodialysis     ESRD with renal transplant in 1987 (cadaveric) after short period of dialysis.  Transplant failed in 2004 and he went back on hemodialysis. As of April 2015 getting HD via L thigh AVG on a TTS schedule at [facility] on [facility].   Cause of ESRD was HTN.    Marland Kitchen Hypertension   . Hx of kidney transplant     1987 > 2004  . Gout tophi   . Chronic steroid use     Has  severe gout. Did not tolerate Allopurinol. Do not taper per PCP   . Systolic CHF, chronic     2 D echo 04/2012 with EF of 45 %   . Malnutrition   . 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 F/U with Dr Donne Hazel. Started on TNA  . Anemia associated with chronic renal failure   . Hepatitis C   . Heart murmur   . GERD (gastroesophageal reflux disease)   . H/O hiatal hernia   . Arthritis   . DVT (deep venous thrombosis)   . Peripheral vascular disease   . Prostate cancer     Past Surgical History  Procedure Laterality Date  . Laparotomy  04/14/2012    Procedure: EXPLORATORY LAPAROTOMY;  Surgeon: Joyice Faster. Cornett, MD;  Location: [place];  Service: General;  Laterality: N/A;  . Colon resection  04/14/2012  . Laparotomy  04/22/2012    Procedure: EXPLORATORY LAPAROTOMY;  Surgeon: Adin Hector, MD;  Location: [place];  Service: General;  Laterality: N/A;  lysis of adhesions, omentoplasty, repair small bowel  . Thrombectomy w/ embolectomy  04/27/2012    Procedure: THROMBECTOMY ARTERIOVENOUS GORE-TEX GRAFT;  Surgeon: Angelia Mould, MD;  Location: [place];  Service: Vascular;  Laterality: Left;  Thrombectomy of left thigh arteriovenous gortex graft  . Prostectomy  2011  . Renal grafts    . Pelvic abcess drainage Right 6/14    removal drain s/p bowl resection 13  . Transanal excision of rectal mass N/A 03/30/2013    Procedure: EXCISION OF anal MASS;  Surgeon: Joyice Faster. Cornett, MD;  Location: [place];  Service: General;  Laterality: N/A;  Exam under anesthesia with excision anal verge mass    Brynda Greathouse, MS RD LDN Clinical Inpatient Dietitian Pager: [phone] Weekend/After hours pager: [phone]

## 2014-01-12 NOTE — Progress Notes (Signed)
Subjective: No complaints  Filed Vitals:   01/12/14 0402 01/12/14 0623 01/12/14 0634 01/12/14 0747  BP: 152/66 158/120 153/69 134/63  Pulse:      Temp:   98.2 F (36.8 C) 98.5 F (36.9 C)  TempSrc:   Oral Oral  Resp: 26 13 10    Height:      Weight: 46.8 kg (103 lb 2.8 oz)     SpO2: 99% 98% 97% 99%   Exam: Alert, no distress No jvd Chest clear bilat RRR no MRG Abd scaphoid, no ascites, NTND No LE edema, bilat pedal ulcerations Neuro is nf, ox3 L thigh AVG +bruit  Dialysis: [facility] 3.5[facility]   47kg   2/2.0 Bath   Heparin 4000    L thigh AVG Calcitriol 1ug     Epo none     Venofer none Recent lab: Hb 12.1 tsat 38% phos 4.6  pth 381   Assessment: 1 Chest pain- for cath today 2 PAD / ischemic foot ulcers- s/p revasc to bilat LE's (Lt 4/14, Rt 4/22) 3 ESRD on HD 4 HTN/vol- BP up and meds increased, BP better, close to dry wt 5 Anemia not on epo 6 MBD cont binders, vit D and sensipar 7 Hep C   Plan- HD today after heart cath, or tomorrow if needed    [name] MD  pager [phone]    cell [phone]  01/12/2014, 8:53 AM     Recent Labs Lab 01/11/14 0656 01/12/14 0321  NA 141 140  K 4.3 5.3  CL 100 100  CO2  --  23  GLUCOSE 78 105*  BUN 30* 49*  CREATININE 6.40* 7.89*  CALCIUM  --  8.4   No results found for this basename: AST, ALT, ALKPHOS, BILITOT, PROT, ALBUMIN,  in the last 168 hours  Recent Labs Lab 01/11/14 0656 01/11/14 2158 01/12/14 0321  WBC  --  7.0 10.5  HGB 14.3 11.4* 11.5*  HCT 42.0 36.4* 35.8*  MCV  --  89.7 88.0  PLT  --  147* 170   . amLODipine  10 mg Oral Daily  . aspirin  81 mg Oral Daily  . atorvastatin  40 mg Oral q1800  . calcitRIOL  1 mcg Oral Q T,Th,Sa-HD  . calcium acetate  2,001 mg Oral TID WC  . carvedilol  6.25 mg Oral BID WC  . cinacalcet  60 mg Oral Q supper  . colchicine  0.6 mg Oral Daily  . feeding supplement (PRO-STAT SUGAR FREE 64)  30 mL Oral Q2000  . multivitamin  1 tablet Oral Daily  . pantoprazole  40 mg  Oral Daily  . predniSONE  5 mg Oral Q breakfast  . sodium chloride  3 mL Intravenous Q12H   . sodium chloride 10 mL (01/12/14 0506)  . nitroGLYCERIN 10 mcg/min (01/12/14 0506)  . tirofiban 0.075 mcg/kg/min (01/12/14 0446)   sodium chloride, acetaminophen, acetaminophen, calcium acetate, guaiFENesin-dextromethorphan, hydrALAZINE, labetalol, loperamide, metoprolol, morphine injection, ondansetron, oxyCODONE, phenol, sodium chloride

## 2014-01-13 ENCOUNTER — Encounter (HOSPITAL_COMMUNITY)
Admission: RE | Disposition: A | Discharge status: Home / Self Care | Payer: Self-pay | Source: Ambulatory Visit | Attending: Cardiology

## 2014-01-13 DIAGNOSIS — L97909 Non-pressure chronic ulcer of unspecified part of unspecified lower leg with unspecified severity: Secondary | ICD-10-CM

## 2014-01-13 DIAGNOSIS — I2510 Atherosclerotic heart disease of native coronary artery without angina pectoris: Secondary | ICD-10-CM

## 2014-01-13 DIAGNOSIS — I10 Essential (primary) hypertension: Secondary | ICD-10-CM

## 2014-01-13 DIAGNOSIS — M79609 Pain in unspecified limb: Secondary | ICD-10-CM

## 2014-01-13 HISTORY — PX: PERCUTANEOUS CORONARY ROTOBLATOR INTERVENTION (PCI-R): SHX5484

## 2014-01-13 LAB — BASIC METABOLIC PANEL
BUN: 24 mg/dL — AB (ref 6–23)
CHLORIDE: 97 meq/L (ref 96–112)
CO2: 27 meq/L (ref 19–32)
Calcium: 8 mg/dL — ABNORMAL LOW (ref 8.4–10.5)
Creatinine, Ser: 4.98 mg/dL — ABNORMAL HIGH (ref 0.50–1.35)
GFR calc non Af Amer: 12 mL/min — ABNORMAL LOW (ref >90)
GFR, EST AFRICAN AMERICAN: 14 mL/min — AB (ref >90)
GLUCOSE: 102 mg/dL — AB (ref 70–99)
POTASSIUM: 4.4 meq/L (ref 3.7–5.3)
Sodium: 137 mEq/L (ref 137–147)

## 2014-01-13 LAB — CBC
HEMATOCRIT: 34.2 % — AB (ref 39.0–52.0)
HEMOGLOBIN: 11.1 g/dL — AB (ref 13.0–17.0)
MCH: 28.5 pg (ref 26.0–34.0)
MCHC: 32.5 g/dL (ref 30.0–36.0)
MCV: 87.9 fL (ref 78.0–100.0)
Platelets: 161 10*3/uL (ref 150–400)
RBC: 3.89 MIL/uL — ABNORMAL LOW (ref 4.22–5.81)
RDW: 14.7 % (ref 11.5–15.5)
WBC: 10.5 10*3/uL (ref 4.0–10.5)

## 2014-01-13 LAB — HEPARIN LEVEL (UNFRACTIONATED): Heparin Unfractionated: <0.1 IU/mL — ABNORMAL LOW (ref 0.30–0.70)

## 2014-01-13 LAB — POCT ACTIVATED CLOTTING TIME
Activated Clotting Time: 298 seconds
Activated Clotting Time: 177 seconds
Activated Clotting Time: 149 seconds

## 2014-01-13 SURGERY — PERCUTANEOUS CORONARY ROTOBLATOR INTERVENTION (PCI-R)
Anesthesia: LOCAL

## 2014-01-13 MED ORDER — NITROGLYCERIN 0.3 MG/HR TD PT24
0.6000 mg | MEDICATED_PATCH | Freq: Every day | TRANSDERMAL | Status: DC
  Filled 2014-01-13: qty 2

## 2014-01-13 MED ORDER — HEPARIN SODIUM (PORCINE) 1000 UNIT/ML IJ SOLN
INTRAMUSCULAR | Status: AC
  Filled 2014-01-13: qty 1

## 2014-01-13 MED ORDER — SODIUM CHLORIDE 0.9 % IJ SOLN
3.0000 mL | Freq: Two times a day (BID) | INTRAMUSCULAR | Status: DC
  Administered 2014-01-13: 3 mL via INTRAVENOUS

## 2014-01-13 MED ORDER — MIDAZOLAM HCL 2 MG/2ML IJ SOLN
INTRAMUSCULAR | Status: AC
  Filled 2014-01-13: qty 2

## 2014-01-13 MED ORDER — LIDOCAINE HCL (PF) 1 % IJ SOLN
INTRAMUSCULAR | Status: AC
  Filled 2014-01-13: qty 30

## 2014-01-13 MED ORDER — GI COCKTAIL ~~LOC~~: 30.0000 mL | Freq: Three times a day (TID) | ORAL | Status: DC | PRN

## 2014-01-13 MED ORDER — HEPARIN (PORCINE) IN NACL 2-0.9 UNIT/ML-% IJ SOLN
INTRAMUSCULAR | Status: AC
  Filled 2014-01-13: qty 500

## 2014-01-13 MED ORDER — SODIUM CHLORIDE 0.9 % IJ SOLN: 3.0000 mL | INTRAMUSCULAR | Status: DC | PRN

## 2014-01-13 MED ORDER — SODIUM CHLORIDE 0.9 % IV SOLN: 250.0000 mL | INTRAVENOUS | Status: DC | PRN

## 2014-01-13 MED ORDER — FENTANYL CITRATE 0.05 MG/ML IJ SOLN
INTRAMUSCULAR | Status: AC
  Filled 2014-01-13: qty 2

## 2014-01-13 MED ORDER — LORAZEPAM 2 MG/ML IJ SOLN
0.5000 mg | Freq: Once | INTRAMUSCULAR | Status: AC
  Administered 2014-01-13: 0.5 mg via INTRAVENOUS

## 2014-01-13 MED ORDER — LORAZEPAM 2 MG/ML IJ SOLN
INTRAMUSCULAR | Status: AC
  Filled 2014-01-13: qty 1

## 2014-01-13 MED ORDER — CLOPIDOGREL BISULFATE 300 MG PO TABS
300.0000 mg | ORAL_TABLET | Freq: Once | ORAL | Status: AC
  Administered 2014-01-13: 300 mg via ORAL
  Filled 2014-01-13: qty 1

## 2014-01-13 MED ORDER — NITROGLYCERIN 0.2 MG/ML ON CALL CATH LAB
INTRAVENOUS | Status: AC
  Filled 2014-01-13: qty 1

## 2014-01-13 MED ORDER — ATROPINE SULFATE 0.1 MG/ML IJ SOLN
INTRAMUSCULAR | Status: AC
  Filled 2014-01-13: qty 10

## 2014-01-13 MED ORDER — VERAPAMIL HCL 2.5 MG/ML IV SOLN
INTRAVENOUS | Status: AC
  Filled 2014-01-13: qty 4

## 2014-01-13 MED ORDER — BIVALIRUDIN 250 MG IV SOLR
INTRAVENOUS | Status: AC
  Filled 2014-01-13: qty 250

## 2014-01-13 MED ORDER — HEPARIN (PORCINE) IN NACL 2-0.9 UNIT/ML-% IJ SOLN
INTRAMUSCULAR | Status: AC
  Filled 2014-01-13: qty 1000

## 2014-01-13 MED ORDER — CLOPIDOGREL BISULFATE 75 MG PO TABS
75.0000 mg | ORAL_TABLET | Freq: Every day | ORAL | Status: DC
  Administered 2014-01-14: 75 mg via ORAL
  Filled 2014-01-13 (×2): qty 1

## 2014-01-13 NOTE — H&P (View-Only) (Signed)
    Subjective:  The complaints of chest pain, shortness of breath. Comfortable. Received Plavix load this morning. Tirofiban running.  Objective:  Vital Signs in the last 24 hours: Temp:  [97.4 F (36.3 C)-99 F (37.2 C)] 98.2 F (36.8 C) (04/24 0800) Pulse Rate:  [65-78] 74 (04/23 1719) Resp:  [10-17] 10 (04/24 0800) BP: (101-153)/(39-74) 135/57 mmHg (04/24 0800) SpO2:  [97 %-100 %] 97 % (04/24 0800) Weight:  [100 lb 8.5 oz (45.6 kg)-106 lb 0.7 oz (48.1 kg)] 100 lb 8.5 oz (45.6 kg) (04/23 1632)  Intake/Output from previous day: 04/23 0701 - 04/24 0700 In: 441.1 [P.O.:120; I.V.:321.1] Out: 1139    Physical Exam: General: Thin in no acute distress. Head:  Normocephalic and atraumatic. No JVD Lungs: Clear to auscultation and percussion with mild bibasal rales. Heart: Normal S1 and S2.  2/6 systolic murmur, no rubs or gallops.  Abdomen: soft, non-tender, positive bowel sounds. Extremities: No clubbing or cyanosis. No edema. Decreased pulses diffusely. HD sites noted. Dry gangrene distal toes first and second right, left first Neurologic: Alert and oriented x 3.    Lab Results:  Recent Labs  01/12/14 0321 01/13/14 0248  WBC 10.5 10.5  HGB 11.5* 11.1*  PLT 170 161    Recent Labs  01/12/14 1345 01/13/14 0540  NA 138 137  K 5.2 4.4  CL 98 97  CO2 24 27  GLUCOSE 134* 102*  BUN 56* 24*  CREATININE 8.83* 4.98*   Hepatic Function Panel  Recent Labs  01/12/14 1345  ALBUMIN 2.6*     Telemetry: No adverse arrhythmias Personally viewed.   EKG:  01/11/14-sinus rhythm, LVH, nonspecific T wave changes likely repolarization abnormality secondary to LVH. QTC 486  Cardiac Studies:  Echocardiogram 01/05/14-EF 60%, mild LVH, grade 2 diastolic dysfunction  Assessment/Plan:  Active Problems:   HTN (hypertension)   PAD (peripheral artery disease)   Angina, class III    ESRD on hemodialysis   52-year-old male with severely calcified right coronary artery/CAD,  end-stage renal disease with severe peripheral vascular disease followed by Dr. Brabham with failed renal transplant, malnutrition, dry gangrene, hepatitis C.  1. CAD-catheterization report reviewed, diagnostic performed by Dr. Harding. Heavily calcified RCA severe single-vessel disease. Otherwise moderate disease in the left coronary system. Slightly elevated LVEDP. Plan is to proceed with rotational atherectomy and PCI by Dr. Chris McAlhany. He is n.p.o. He received a Plavix load this morning. He is currently on Aggrastat.  2. End-stage renal disease-per nephrology. Hemodialysis  3. Gangrene/severe peripheral vascular disease-I have reviewed Dr. Brabham note. He states that he has good Doppler signals ulcers are still present. No further vascular intervention at this point. He states that he could consider ligation of left lower extremity AV GG to help with circulation to left leg. He has very few access options. He would prefer not to do this. He will add nitroglycerin patch to both ankles if this will help.    Zi Newbury 01/13/2014, 11:16 AM     

## 2014-01-13 NOTE — CV Procedure (Signed)
Cardiac Catheterization Operative Report  [name] [id number] 4/24/20154:45 PM DETERDING,JAMES L, MD  Procedure Performed:  1. Rotablator atherectomy of the RCA 2. PTCA/DES x 1 in the proximal RCA 3. PTCA/DES x 2 mid RCA   Operator: Lauree Chandler, MD  Indication:  53 yo male with history of CAD admitted with class III unstable angina. Diagnostic cath 01/12/14 per Dr. Ellyn Hack. He asked me to perform PCI of the RCA with rotablator atherectomy today.                                       Procedure Details: The risks, benefits, complications, treatment options, and expected outcomes were discussed with the patient. The patient and/or family concurred with the proposed plan, giving informed consent. The patient was brought to the cath lab after IV hydration was begun and oral premedication was given. The patient was further sedated with Versed and Fentanyl. The right groin was prepped and draped in the usual manner. Using the modified Seldinger access technique, a 7 French sheath was placed in the right femoral artery. A 6 French sheath was placed in the right femoral vein. A transvenous pacing wire was placed in the RV through the venous sheath. I then gave a bolus of Angiomax and a drip was started. He had been loaded with Plavix previously. The entire vessel was heavily calcified making this a complex procedure. The proximal lesion in the bend of the vessel was easily crossed with the Cougar IC wire but I could not cross the more distal lesion in the mid vessel. I then placed a OTW balloon and tried to cross the lesion with a Whisper and a Fielder XT wire. I then removed this and placed a FineCross transit catheter in the mid vessel and used a Pilot wire. I was unable to cross the stenosis with the transit catheter so I removed the wire and passed the rotafloppy wire beyond the stenosis and into the distal vessel. At this point we made multiple runs with a 1.5 mm rotablator burr. I  then changed this out and made multiple passed with a 2.0 burr. I then pre-dilated the vessel with a 2.5 x 20 mm balloon x 2. I tried to pass a stent but could not get it beyond the proximal vessel. I then pre-dilated the entire proximal and mid vessel with a 2.75 x 20 mm balloon. At this point I was able to deploy 3 overlapping DES stents. I deployed a 3.0 x 23 mm Xience DES distally, 3.0 x 23 mm Xience DES in the mid segment and a 3.0 x 28 mm Xience DES in the proximal vessel. The stents were post-dilated with a 3.5 x 15 mm Freedom balloon x 4. There was an excellent angiographic result. The stenosis was taken from 99% in the mid vessel down to 0%. The proximal stenosis was taken from 90% down to 0%.    There were no immediate complications. The patient was taken to the recovery area in stable condition.   Hemodynamic Findings: Central aortic pressure: 93/49  Impression: 1. Unstable angina secondary to severe stenosis in heavily calcified proximal and mid RCA 2. Successful rotablator atherectomy, DES x 3 proximal and mid RCA  Recommendations: He will need long term dual anti-platelet therapy with ASA and Plavix. Continue other cardiac meds.        Complications:  None; patient tolerated the procedure well.

## 2014-01-13 NOTE — Progress Notes (Signed)
ANTICOAGULATION CONSULT NOTE - Follow Up Consult  Pharmacy Consult for heparin Indication: CAD  Labs:  Recent Labs  01/11/14 0656 01/11/14 2158 01/12/14 0321 01/12/14 1345 01/13/14 0248  HGB 14.3 11.4* 11.5*  --  11.1*  HCT 42.0 36.4* 35.8*  --  34.2*  PLT  --  147* 170  --  161  LABPROT  --   --  12.6  --   --   INR  --   --  0.96  --   --   HEPARINUNFRC  --   --   --   --  <0.10*  CREATININE 6.40*  --  7.89* 8.83*  --     Assessment: 52yo male undetectable on heparin with initial dosing post-cath awaiting staged PCI, also on Aggrastat.  Goal of Therapy:  Heparin level 0.3-0.5 units/ml   Plan:  Will increase heparin gtt by 4 units/kg/hr to 800 units/hr and check level in [place], PharmD, BCPS  01/13/2014,5:22 AM

## 2014-01-13 NOTE — Interval H&P Note (Signed)
History and Physical Interval Note:  01/13/2014 1:37 PM  [name]  has presented today for PCI with rotablator of the rCA  with the diagnosis of cp  The various methods of treatment have been discussed with the patient and family. After consideration of risks, benefits and other options for treatment, the patient has consented to  Procedure(s): PERCUTANEOUS CORONARY ROTOBLATOR INTERVENTION (PCI-R) (N/A) as a surgical intervention .  The patient's history has been reviewed, patient examined, no change in status, stable for surgery.  I have reviewed the patient's chart and labs.  Questions were answered to the patient's satisfaction.    Cath Lab Visit (complete for each Cath Lab visit)  Clinical Evaluation Leading to the Procedure:   ACS: no  Non-ACS:    Anginal Classification: CCS III  Anti-ischemic medical therapy: Maximal Therapy (2 or more classes of medications)  Non-Invasive Test Results: No non-invasive testing performed  Prior CABG: No previous CABG        Burnell Blanks

## 2014-01-13 NOTE — Progress Notes (Signed)
    Subjective:  The complaints of chest pain, shortness of breath. Comfortable. Received Plavix load this morning. Tirofiban running.  Objective:  Vital Signs in the last 24 hours: Temp:  [97.4 F (36.3 C)-99 F (37.2 C)] 98.2 F (36.8 C) (04/24 0800) Pulse Rate:  [65-78] 74 (04/23 1719) Resp:  [10-17] 10 (04/24 0800) BP: (101-153)/(39-74) 135/57 mmHg (04/24 0800) SpO2:  [97 %-100 %] 97 % (04/24 0800) Weight:  [100 lb 8.5 oz (45.6 kg)-106 lb 0.7 oz (48.1 kg)] 100 lb 8.5 oz (45.6 kg) (04/23 1632)  Intake/Output from previous day: 04/23 0701 - 04/24 0700 In: 441.1 [P.O.:120; I.V.:321.1] Out: 1139    Physical Exam: General: Thin in no acute distress. Head:  Normocephalic and atraumatic. No JVD Lungs: Clear to auscultation and percussion with mild bibasal rales. Heart: Normal S1 and S2.  2/6 systolic murmur, no rubs or gallops.  Abdomen: soft, non-tender, positive bowel sounds. Extremities: No clubbing or cyanosis. No edema. Decreased pulses diffusely. HD sites noted. Dry gangrene distal toes first and second right, left first Neurologic: Alert and oriented x 3.    Lab Results:  Recent Labs  01/12/14 0321 01/13/14 0248  WBC 10.5 10.5  HGB 11.5* 11.1*  PLT 170 161    Recent Labs  01/12/14 1345 01/13/14 0540  NA 138 137  K 5.2 4.4  CL 98 97  CO2 24 27  GLUCOSE 134* 102*  BUN 56* 24*  CREATININE 8.83* 4.98*   Hepatic Function Panel  Recent Labs  01/12/14 1345  ALBUMIN 2.6*     Telemetry: No adverse arrhythmias Personally viewed.   EKG:  01/11/14-sinus rhythm, LVH, nonspecific T wave changes likely repolarization abnormality secondary to LVH. QTC 486  Cardiac Studies:  Echocardiogram 01/05/14-EF 60%, mild LVH, grade 2 diastolic dysfunction  Assessment/Plan:  Active Problems:   HTN (hypertension)   PAD (peripheral artery disease)   Angina, class III    ESRD on hemodialysis   53 year old male with severely calcified right coronary artery/CAD,  end-stage renal disease with severe peripheral vascular disease followed by Dr. Trula Slade with failed renal transplant, malnutrition, dry gangrene, hepatitis C.  1. CAD-catheterization report reviewed, diagnostic performed by Dr. Ellyn Hack. Heavily calcified RCA severe single-vessel disease. Otherwise moderate disease in the left coronary system. Slightly elevated LVEDP. Plan is to proceed with rotational atherectomy and PCI by Dr. Darlina Guys. He is n.p.o. He received a Plavix load this morning. He is currently on Aggrastat.  2. End-stage renal disease-per nephrology. Hemodialysis  3. Gangrene/severe peripheral vascular disease-I have reviewed Dr. Trula Slade note. He states that he has good Doppler signals ulcers are still present. No further vascular intervention at this point. He states that he could consider ligation of left lower extremity AV GG to help with circulation to left leg. He has very few access options. He would prefer not to do this. He will add nitroglycerin patch to both ankles if this will help.    [name] 01/13/2014, 11:16 AM

## 2014-01-13 NOTE — Progress Notes (Signed)
    Subjective  - POD #3  States both feet are better but still gets burning occasionally   Physical Exam:  + doppler signals bilaterally Groin soft L AVGG thrill Abdomen soft Left great toe eschar and right great toe and heel ulcer    Assessment/Plan:  POD #3  VASC:  S/p bilateral percutaneous intervention.  Good doppler signals.  Ulcers still present.  No further vascular intervention at this point. Could consider ligation of L LE AVGG to help with circulation to left leg.  However, patient has very few access options, so I would prefer to not do this.  Will add NTG patch to both ankles to see if this helps  For PCI today  [name] 01/13/2014 10:15 AM --  Danley Danker Vitals:   01/13/14 0800  BP: 135/57  Pulse:   Temp: 98.2 F (36.8 C)  Resp:     Intake/Output Summary (Last 24 hours) at 01/13/14 1015 Last data filed at 01/13/14 0600  Gross per 24 hour  Intake  411.1 ml  Output   1139 ml  Net -727.9 ml     Laboratory CBC    Component Value Date/Time   WBC 10.5 01/13/2014 0248   HGB 11.1* 01/13/2014 0248   HCT 34.2* 01/13/2014 0248   PLT 161 01/13/2014 0248    BMET    Component Value Date/Time   NA 137 01/13/2014 0540   K 4.4 01/13/2014 0540   CL 97 01/13/2014 0540   CO2 27 01/13/2014 0540   GLUCOSE 102* 01/13/2014 0540   BUN 24* 01/13/2014 0540   CREATININE 4.98* 01/13/2014 0540   CALCIUM 8.0* 01/13/2014 0540   GFRNONAA 12* 01/13/2014 0540   GFRAA 14* 01/13/2014 0540    COAG Lab Results  Component Value Date   INR 0.96 01/12/2014   INR 1.18 06/03/2012   INR 1.17 05/13/2012   No results found for this basename: PTT    Antibiotics Anti-infectives   Start     Dose/Rate Route Frequency Ordered Stop   01/11/14 1345  levofloxacin (LEVAQUIN) tablet 250 mg  Status:  Discontinued     250 mg Oral Daily 01/11/14 1332 01/11/14 1405       V. Leia Alf, M.D. [facility] Office: [phone] Pager:  [phone]

## 2014-01-13 NOTE — Progress Notes (Addendum)
Subjective:  Back from cath lab and  PCI of the RCA with rotablator atherectomy/ lethargic no sob ,minimal cp  Objective Vital signs in last 24 hours: Filed Vitals:   01/13/14 0300 01/13/14 0400 01/13/14 0800 01/13/14 1200  BP:  144/62 135/57 108/69  Pulse:      Temp: 99 F (37.2 C)  98.2 F (36.8 C) 98.4 F (36.9 C)  TempSrc: Oral  Oral Oral  Resp:  14 10   Height:      Weight:      SpO2:  97% 97% 98%   EXAM= General = slightly Lethargic just back from Cath lab and RCA Procedure NAD No jvd  Chest =CTA bilat  Card=RRR no MRG  Abd+ scaphoid, no ascites, NTND   Extrem. =No LE edema, bilat pedal ulcerations  HD Access=L thigh AVG +bruit   [facility]  3.5h 47kg 2/2.0 [facility] Heparin 4000 L thigh AVG  Calcitriol 1ug Epo none Venofer none  Recent lab: Hb 12.1 tsat 38% phos 4.6 pth 381   Assessment:  1 Unstable Angina sec. To severe stenosis / heavily calcified Prox/ mid RCA  =Sp Successful Rotablator Atherectomy, DES x3 prox. And mid RCA= card. rx 2. ESRD= HD in am , no uf 3 PAD / ischemic foot ulcers- s/p revasc to bilat LE's (Lt 4/14, Rt 4/22)  4 HTN/vol- BP up and meds increased, BP better, close to dry wt  5 Anemia= hgb 11.1 not on epo  6 MBD cont binders, vit D and sensipar  7 Hep C   Plan- HD in am no uf fu labs  Ernest Haber, PA-C [place] [phone] 01/13/2014 ,2:09 PM  LOS: 2 days   Pt seen, examined and agree w A/P as above.  [name] MD pager [phone]    cell [phone] 01/13/2014, 5:00 PM    Labs: Basic Metabolic Panel:  Recent Labs Lab 01/12/14 0321 01/12/14 1345 01/13/14 0540  NA 140 138 137  K 5.3 5.2 4.4  CL 100 98 97  CO2 23 24 27   GLUCOSE 105* 134* 102*  BUN 49* 56* 24*  CREATININE 7.89* 8.83* 4.98*  CALCIUM 8.4 7.9* 8.0*  PHOS  --  4.7*  --    Liver Function Tests:  Recent Labs Lab 01/12/14 1345  ALBUMIN 2.6*   CBC:  Recent Labs Lab 01/11/14 2158 01/12/14 0321 01/13/14 0248  WBC 7.0  10.5 10.5  HGB 11.4* 11.5* 11.1*  HCT 36.4* 35.8* 34.2*  MCV 89.7 88.0 87.9  PLT 147* 170 161   CBG:  Recent Labs Lab 01/12/14 0748 01/12/14 1147  GLUCAP 83 83    Medications: . heparin 800 Units/hr (01/13/14 0800)  . tirofiban 0.075 mcg/kg/min (01/13/14 0800)   . [MAR HOLD] amLODipine  10 mg Oral Daily  . [facility] HOLD] aspirin  81 mg Oral Daily  . [facility] HOLD] atorvastatin  40 mg Oral q1800  . [facility] HOLD] calcitRIOL  1 mcg Oral Q T,Th,Sa-HD  . [facility] HOLD] calcium acetate  2,001 mg Oral TID WC  . [facility] HOLD] carvedilol  6.25 mg Oral BID WC  . [facility] HOLD] cinacalcet  60 mg Oral Q supper  . [START ON 01/14/2014] clopidogrel  75 mg Oral Q breakfast  . [MAR HOLD] colchicine  0.6 mg Oral Daily  . [MAR HOLD] feeding supplement (PRO-STAT SUGAR FREE 64)  30 mL Oral Q2000  . [facility] HOLD] multivitamin  1 tablet Oral Daily  . [facility] HOLD] nitroGLYCERIN  0.6 mg Transdermal Daily  . [  MAR HOLD] pantoprazole  40 mg Oral Daily  . [MAR HOLD] predniSONE  5 mg Oral Q breakfast  . sodium chloride  3 mL Intravenous Q12H

## 2014-01-14 DIAGNOSIS — I209 Angina pectoris, unspecified: Secondary | ICD-10-CM

## 2014-01-14 LAB — BASIC METABOLIC PANEL
BUN: 46 mg/dL — ABNORMAL HIGH (ref 6–23)
CHLORIDE: 98 meq/L (ref 96–112)
CO2: 21 mEq/L (ref 19–32)
Calcium: 7.9 mg/dL — ABNORMAL LOW (ref 8.4–10.5)
Creatinine, Ser: 7.11 mg/dL — ABNORMAL HIGH (ref 0.50–1.35)
GFR calc Af Amer: 9 mL/min — ABNORMAL LOW (ref >90)
GFR calc non Af Amer: 8 mL/min — ABNORMAL LOW (ref >90)
Glucose, Bld: 85 mg/dL (ref 70–99)
POTASSIUM: 5 meq/L (ref 3.7–5.3)
Sodium: 138 mEq/L (ref 137–147)

## 2014-01-14 LAB — CBC
HCT: 32.6 % — ABNORMAL LOW (ref 39.0–52.0)
Hemoglobin: 10.6 g/dL — ABNORMAL LOW (ref 13.0–17.0)
MCH: 28.4 pg (ref 26.0–34.0)
MCHC: 32.5 g/dL (ref 30.0–36.0)
MCV: 87.4 fL (ref 78.0–100.0)
Platelets: 154 10*3/uL (ref 150–400)
RBC: 3.73 MIL/uL — ABNORMAL LOW (ref 4.22–5.81)
RDW: 14.7 % (ref 11.5–15.5)
WBC: 11.2 10*3/uL — ABNORMAL HIGH (ref 4.0–10.5)

## 2014-01-14 MED ORDER — OXYCODONE HCL 5 MG PO TABS
ORAL_TABLET | ORAL | Status: AC
  Administered 2014-01-14: 5 mg via ORAL
  Filled 2014-01-14: qty 1

## 2014-01-14 MED ORDER — AMLODIPINE BESYLATE 10 MG PO TABS: 10.0000 mg | ORAL_TABLET | Freq: Every day | ORAL | Status: DC

## 2014-01-14 MED ORDER — CARVEDILOL 6.25 MG PO TABS: 6.2500 mg | ORAL_TABLET | Freq: Two times a day (BID) | ORAL | Status: DC

## 2014-01-14 MED ORDER — CLOPIDOGREL BISULFATE 75 MG PO TABS: 75.0000 mg | ORAL_TABLET | Freq: Every day | ORAL | Status: DC

## 2014-01-14 MED ORDER — PANTOPRAZOLE SODIUM 40 MG PO TBEC: 40.0000 mg | DELAYED_RELEASE_TABLET | Freq: Every day | ORAL | Status: DC

## 2014-01-14 MED ORDER — NITROGLYCERIN 0.3 MG/HR TD PT24
0.3000 mg | MEDICATED_PATCH | Freq: Every day | TRANSDERMAL | Status: DC
  Administered 2014-01-14: 0.3 mg via TRANSDERMAL
  Filled 2014-01-14: qty 1

## 2014-01-14 MED ORDER — ATORVASTATIN CALCIUM 40 MG PO TABS: 40.0000 mg | ORAL_TABLET | Freq: Every day | ORAL | Status: DC

## 2014-01-14 MED ORDER — NITROGLYCERIN 0.3 MG/HR TD PT24: 0.3000 mg | MEDICATED_PATCH | Freq: Every day | TRANSDERMAL | Status: DC

## 2014-01-14 NOTE — Progress Notes (Signed)
Sheath removal note: R groin level 0 prior to removing arterial and venous sheaths. VSS. Pedal pulse present. Arterial sheath removed at 2249. Pressure held by this RN, Cipriano Mile, RN and Jules Husbands, MD for 45 minutes until hemostasis achieved. Venous sheath pulled during the pressure at 2325. Pressure dressing applied. Site level 0 post procedure. Pedal pulses present post procedure. Patient stable throughout procedure. Patient given post sheath pull instructions and verbalized understanding.

## 2014-01-14 NOTE — Discharge Summary (Signed)
Personally seen and examined.  Agree with above.  DAPT.  Follow up with Vascular and Cards.

## 2014-01-14 NOTE — Discharge Summary (Signed)
Physician Discharge Summary  Patient ID: [name] MRN: [id number] DOB/AGE: 1960-10-07 53 y.o.  Admit date: 01/11/2014 Discharge date: 01/14/2014  Primary Discharge Diagnosis:  1. CAD with unstable angina  2. S/P PCI   1. Rotablator atherectomy of the RCA   2. PTCA/DES x 1 in the proximal RCA   3. PTCA/DES x 2 mid RCA   3. Bilateral Toe Ulcers S/P atherectomy of right popliteal artery above and below the knee S/P Angioplasty of the right dorsalis pedis anterior tibial artery.  Secondary Discharge Diagnosis  1. ESRD: Dialysis with failed transplant 2. Hepatitis C 3. Gangrene of right toe  Primary Cardiologist: Claiborne Billings  Significant Diagnostic Studies: 1. Cardiac Cath:  Coronary Anatomy: Right dominant, heavily calcified proximal Left Main- LAD and almost the entire RCA with circumferential calcification  Left Main: Heavily calcified, large-caliber vessel with lumen diameter notably smaller than the calcified segment. There is mild distal calcification but no significant stenosis. LAD: Large-caliber vessel with a very proximal septal perforator. The vessel is heavily calcified to the first half of the vessel with diffuse tubular 30-40% stenosis. This dissection normalizes after a large second diagonal branch where there is a 50% stenosis. It then courses down around the apex perfusing the inferoapex. There are several large septal perforator branches and a total of 3 diagonal branches. The distal branches quite tortuous, free of disease  D1: Moderate caliber proximal vessel with several small distal branches. Diffuse mild luminal irregularities.  D2: Moderate to large caliber vessel that takes off from heavily calcified portion of the mid LAD as described above. This vessel is a large portion of the mid inferolateral wall. They're several distal branches. Mild luminal irregularities only.  D3: Smaller moderate caliber, somewhat tortuous vessel that has several small distal  branches with only the first proximal segment being smaller moderate caliber. No significant disease. Left Circumflex: Large-caliber vessel that gives off a very proximal branch that actually appears to be a septal perforator and a ramus distribution there is an ostial calcified at least 50% stenosis but not flow limiting. The vessel is nondominant and gives rise to 2 small AV groove branch is that give collaterals  OM1 & 2: OM1 is a larger the 2 branches being a moderate caliber. At the bifurcation and OM1 on 2 there is a calcified 30-40% stenosis. The 2 vessels basically provide flow to the inferolateral wall RCA: Large-caliber, heavily calcified dominant vessel with a proximal focal hazy 70% stenosis in the [place] bend followed by diffuse calcified tapered/tubular 70% stenosis. Dr. is short relatively normal segment just following the second marginal branch there is a heavily calcified eccentric/ulcerated plaque of 95% followed by 85% just prior to the genu. The vessel then normalizes somewhat with mild distal disease. He still remains heavily calcified until the bifurcation into the Right Posterior Ascending Artery (RPDA) and the Right Posterior AV Groove Branch (RPAV).  RPDA: Moderate caliber somewhat tortuous vessel with ostial 50% stenosis. Tapers are relatively small caliber distal vessel.  RPL Sysytem:The RPAV begins as a moderate caliber vessel it branches into 3 posterolateral branches of the mid RPL to being the larger of the 3. There is actually TIMI 2 flow in RPL 3.  2. Coronary Intervention 01/13/2014 Impression:  1. Unstable angina secondary to severe stenosis in heavily calcified proximal and mid RCA  2. Successful rotablator atherectomy, DES x 3 proximal and mid RCA   Consults:  1. [facility] 2. [facility] -Nephrology  [facility] Course:  [name] is a 38 year old patient of Dr. Shelva Majestic he was admitted initially with cellulitis and gangrene of his right toes and foot.  He was seen initially by Dr. Trula Slade was planned to be intervention on his right popliteal. He had atherectomy x2 the right popliteal with balloon angioplasty x2 to the right popliteal and angioplasty to the right dorsalis pedis.    Post procedure he developed chest pain with significantly elevated blood pressure greater than 240 mmHg. EKG was found to be abnormal and cardiology was asked to evaluate and assume care. Patient continued 10 out of 10 chest discomfort. He has had 8-9/10 chest discomfort prior to coming to the hospital with relief of symptoms with lying down.    Cardiac catheterization was completed by Dr. Glenetta Hew unfortunately 3 2015 which did demonstrate proximal RCA stenosis with calcified eccentric and ulcerated plaque of 95% followed by 85% just prior to the genu. Is felt that this was the culprit lesion. Discussion was had with interventional cardiologist and a interventional study was completed by Dr. Lauree Chandler which involve PTCA with drug-eluting stent x1 to the proximal RCA and PTCA with drug-eluting stent x2 to the mid right coronary artery.    He was advised to be on dual antiplatelet therapy with aspirin and Plavi    The patient was followed by nephrology with continued hemodialysis during hospitalization.   He was examined by Dr. Candee Furbish on day of discharge and found to be stable and is without recurrent chest pain. He will followup with his primary cardiologist Dr. Claiborne Billings, you also followup Dr. Trula Slade on a post hospitalization interventional office visit, along with continuing to go to hemodialysis as previously scheduled New Rx for aspirin, Plavix, and atorvastatin were provided.    Discharge Exam: Blood pressure 133/71, pulse 99, temperature 97.3 F (36.3 C), temperature source Oral, resp. rate 18, height 5\' 11"  (1.803 m), weight 105 lb 2.6 oz (47.7 kg), SpO2 100.00%.  Labs:   Lab Results  Component Value Date   WBC 11.2* 01/14/2014   HGB 10.6* 01/14/2014    HCT 32.6* 01/14/2014   MCV 87.4 01/14/2014   PLT 154 01/14/2014     Recent Labs Lab 01/14/14 0600  NA 138  K 5.0  CL 98  CO2 21  BUN 46*  CREATININE 7.11*  CALCIUM 7.9*  GLUCOSE 85   Lab Results  Component Value Date   CKTOTAL <7* 05/05/2012   CKMB 1.6 05/05/2012   TROPONINI <0.30 01/04/2014    Lab Results  Component Value Date   CHOL 121 01/04/2014   CHOL 173 05/31/2012   CHOL 158 05/24/2012   Lab Results  Component Value Date   HDL 68 01/04/2014   Lab Results  Component Value Date   LDLCALC 26 01/04/2014   Lab Results  Component Value Date   TRIG 137 01/04/2014   TRIG 125 05/31/2012   TRIG 128 05/24/2012   Lab Results  Component Value Date   CHOLHDL 1.8 01/04/2014   No results found for this basename: [place]     Discharge Orders   Future Appointments Provider Department Dept Phone   03/06/2014 10:40 AM Marcello Moores A. Cornett, [facility], [place] [phone]   Future Orders Complete By Expires   Diet - low sodium heart healthy  As directed    Increase activity slowly  As directed        Medication List  STOP taking these medications       levofloxacin 250 MG tablet  Commonly known as:  LEVAQUIN     metoprolol tartrate 12.5 mg Tabs tablet  Commonly known as:  LOPRESSOR     omeprazole 40 MG capsule  Commonly known as:  PRILOSEC      TAKE these medications       amLODipine 10 MG tablet  Commonly known as:  NORVASC  Take 1 tablet (10 mg total) by mouth daily.     atorvastatin 40 MG tablet  Commonly known as:  LIPITOR  Take 1 tablet (40 mg total) by mouth daily at 6 PM.     calcium acetate 667 MG capsule  Commonly known as:  PHOSLO  Take 1,334 mg by mouth as needed (with snacks).     calcium acetate 667 MG tablet  Commonly known as:  PHOSLO  Take 2,001 mg by mouth 3 (three) times daily.     carvedilol 6.25 MG tablet  Commonly known as:  COREG  Take 1 tablet (6.25 mg total) by mouth 2  (two) times daily with a meal.     clopidogrel 75 MG tablet  Commonly known as:  PLAVIX  Take 1 tablet (75 mg total) by mouth daily with breakfast.     COLCRYS 0.6 MG tablet  Generic drug:  colchicine  Take 0.6 mg by mouth daily.     LIDOCAINE EX  Apply 1 application topically See admin instructions. Apply to inject site on dialysis days (Tues, Thur, Sat) if needed for pain     loperamide 2 MG tablet  Commonly known as:  IMODIUM A-D  Take 2 mg by mouth daily as needed. For diarrhea     multivitamin Tabs tablet  Take 1 tablet by mouth daily.     nitroGLYCERIN 0.3 mg/hr patch  Commonly known as:  NITRODUR - Dosed in mg/24 hr  Place 1 patch (0.3 mg total) onto the skin daily.     oxyCODONE-acetaminophen 5-325 MG per tablet  Commonly known as:  PERCOCET/ROXICET  Take 1 tablet by mouth every 6 (six) hours as needed. For pain     pantoprazole 40 MG tablet  Commonly known as:  PROTONIX  Take 1 tablet (40 mg total) by mouth daily.     predniSONE 5 MG tablet  Commonly known as:  DELTASONE  Take 5 mg by mouth daily.     SENSIPAR 60 MG tablet  Generic drug:  cinacalcet  Take 60 mg by mouth daily.       Follow-up Information   Follow up with Troy Sine, MD. (Our office will call you for appointment)    Specialty:  Cardiology   Contact information:   [address] [place] [address] [phone]       Follow up with Eldridge Abrahams, MD. (Please call the office to make follow up appt if one had not been already scheduled.)    Specialty:  Vascular Surgery   Contact information:   [place] [address] [phone]         Time spent with patient to include physician time:40 minutes. Signed: Lendon Colonel 01/14/2014, 12:10 PM Co-Sign MD

## 2014-01-14 NOTE — Progress Notes (Addendum)
   Daily Progress Note  Assessment/Planning: POD #1 s/p Rotablator atherectomy RCA, DES RCA x 3; POD#3 s/p OA R pop, DCB PTA R pop, PTA R DP; POD #11 s/p L pop OA+PTA   Wound status unchanged  Will order ABI to see any chg after interventions  Dr. Trula Slade will be back on Monday  Subjective  - 1 Day Post-Op  Pain ok  Objective Filed Vitals:   01/14/14 0930 01/14/14 1000 01/14/14 1030 01/14/14 1100  BP: 121/63 117/72 104/47 116/65  Pulse: 102 100 100 102  Temp:      TempSrc:      Resp: 17 17 18 16   Height:      Weight:      SpO2: 100% 98% 97% 100%   No intake or output data in the 24 hours ending 01/14/14 1122  PULM  CTAB CV  RRR GI  soft, NTND VASC  Both legs essentially unchanged in reviewing previous notes  Laboratory CBC    Component Value Date/Time   WBC 11.2* 01/14/2014 0600   HGB 10.6* 01/14/2014 0600   HCT 32.6* 01/14/2014 0600   PLT 154 01/14/2014 0600    BMET    Component Value Date/Time   NA 138 01/14/2014 0600   K 5.0 01/14/2014 0600   CL 98 01/14/2014 0600   CO2 21 01/14/2014 0600   GLUCOSE 85 01/14/2014 0600   BUN 46* 01/14/2014 0600   CREATININE 7.11* 01/14/2014 0600   CALCIUM 7.9* 01/14/2014 0600   GFRNONAA 8* 01/14/2014 0600   GFRAA 9* 01/14/2014 0600    Adele Barthel, MD [facility] [place]: [phone] Pager: [phone]  01/14/2014, 11:22 AM

## 2014-01-14 NOTE — Procedures (Signed)
I was present at this dialysis session, have reviewed the session itself and made  appropriate changes  [name] MD (pgr) [phone]    [phone] 01/14/2014, 11:30 AM

## 2014-01-14 NOTE — Progress Notes (Signed)
Subjective:  On HD, no complaints  Objective Vital signs in last 24 hours: Filed Vitals:   01/14/14 1000 01/14/14 1030 01/14/14 1100 01/14/14 1123  BP: 117/72 104/47 116/65 133/71  Pulse: 100 100 102 99  Temp:    97.3 F (36.3 C)  TempSrc:    Oral  Resp: 17 18 16 18   Height:      Weight:    47.7 kg (105 lb 2.6 oz)  SpO2: 98% 97% 100% 100%  Exam: Alert, thin AAM , no distress, calm No jvd  Chest clear bilat RRR no MRG  Abd scaphoid, no ascites, NTND  No LE edema, +bilat pedal ulcerations  L thigh AVG +bruit   [facility]  3.5h 47kg 2/2.0 [place] Heparin 4000 L thigh AVG  Calcitriol 1ug Epo none Venofer none  Recent lab: Hb 12.1 tsat 38% phos 4.6 pth 381   Assessment:  1 Unstable angina / RCA disease- s/p PCI w rotablator, stent x 3 on 4/24 2 ESRD on HD 3 PAD / ischemic foot ulcers- s/p percutaneous revasc to bilat LE's (Lt 4/14, Rt 4/22)  4 HTN/vol- coreg and norvasc, at dry wt 5 Anemia not on epo  6 MBD cont binders, vit D and sensipar  7 Hep C   Plan- HD today   [name] MD pager [phone]    cell [phone] 01/13/2014, 5:00 PM    Labs: Basic Metabolic Panel:  Recent Labs Lab 01/12/14 1345 01/13/14 0540 01/14/14 0600  NA 138 137 138  K 5.2 4.4 5.0  CL 98 97 98  CO2 24 27 21   GLUCOSE 134* 102* 85  BUN 56* 24* 46*  CREATININE 8.83* 4.98* 7.11*  CALCIUM 7.9* 8.0* 7.9*  PHOS 4.7*  --   --    Liver Function Tests:  Recent Labs Lab 01/12/14 1345  ALBUMIN 2.6*   CBC:  Recent Labs Lab 01/11/14 2158 01/12/14 0321 01/13/14 0248 01/14/14 0600  WBC 7.0 10.5 10.5 11.2*  HGB 11.4* 11.5* 11.1* 10.6*  HCT 36.4* 35.8* 34.2* 32.6*  MCV 89.7 88.0 87.9 87.4  PLT 147* 170 161 154   CBG:  Recent Labs Lab 01/12/14 0748 01/12/14 1147  GLUCAP 83 83    Medications:   . amLODipine  10 mg Oral Daily  . aspirin  81 mg Oral Daily  . atorvastatin  40 mg Oral q1800  . calcitRIOL  1 mcg Oral Q T,Th,Sa-HD  . calcium acetate  2,001 mg Oral  TID WC  . carvedilol  6.25 mg Oral BID WC  . cinacalcet  60 mg Oral Q supper  . clopidogrel  75 mg Oral Q breakfast  . colchicine  0.6 mg Oral Daily  . feeding supplement (PRO-STAT SUGAR FREE 64)  30 mL Oral Q2000  . multivitamin  1 tablet Oral Daily  . nitroGLYCERIN  0.3 mg Transdermal Daily  . pantoprazole  40 mg Oral Daily  . predniSONE  5 mg Oral Q breakfast

## 2014-01-14 NOTE — Progress Notes (Addendum)
OK for discharge from cardiac perspective. Will need close followup with vascular. 7-14 day followup with cardiology - Dr. Shelva Majestic is his primary cardiologist

## 2014-01-16 ENCOUNTER — Telehealth: Payer: Self-pay | Admitting: Cardiology

## 2014-01-16 ENCOUNTER — Telehealth: Payer: Self-pay | Admitting: Cardiovascular Disease

## 2014-01-16 DIAGNOSIS — K921 Melena: Secondary | ICD-10-CM

## 2014-01-16 DIAGNOSIS — R195 Other fecal abnormalities: Secondary | ICD-10-CM

## 2014-01-16 DIAGNOSIS — Z79899 Other long term (current) drug therapy: Secondary | ICD-10-CM

## 2014-01-16 LAB — BASIC METABOLIC PANEL
BUN: 46 mg/dL — ABNORMAL HIGH (ref 6–23)
CALCIUM: 8.2 mg/dL — AB (ref 8.4–10.5)
CHLORIDE: 101 meq/L (ref 96–112)
CO2: 29 meq/L (ref 19–32)
Creat: 7.8 mg/dL — ABNORMAL HIGH (ref 0.50–1.35)
Glucose, Bld: 92 mg/dL (ref 70–99)
Potassium: 3.8 mEq/L (ref 3.5–5.3)
Sodium: 142 mEq/L (ref 135–145)

## 2014-01-16 LAB — CBC
HEMATOCRIT: 30.7 % — AB (ref 39.0–52.0)
Hemoglobin: 9.8 g/dL — ABNORMAL LOW (ref 13.0–17.0)
MCH: 27.8 pg (ref 26.0–34.0)
MCHC: 32.1 g/dL (ref 30.0–36.0)
MCV: 87.2 fL (ref 78.0–100.0)
PLATELETS: 166 10*3/uL (ref 150–400)
RBC: 3.52 MIL/uL — AB (ref 4.22–5.81)
RDW: 14.4 % (ref 11.5–15.5)
WBC: 9.5 10*3/uL (ref 4.0–10.5)

## 2014-01-16 NOTE — Telephone Encounter (Signed)
Results in Epic.  Message forwarded to Cecilie Kicks, NP.

## 2014-01-16 NOTE — Telephone Encounter (Signed)
Alert Lab

## 2014-01-16 NOTE — Telephone Encounter (Signed)
Cecilie Kicks, NP notified and advised reviewed all labs in Epic.  (fax was received) Advised pt f/u with PCP for evaluation and possible referral as he cannot stop ASA and Plavix.  Will have results faxed to PCP once pt calls back.

## 2014-01-16 NOTE — Telephone Encounter (Signed)
Please call-his stool is real black,he thinks it might be coming from some of his medicine.

## 2014-01-16 NOTE — Telephone Encounter (Signed)
Yes, and if mild change in H/H needs appt this week.

## 2014-01-16 NOTE — Telephone Encounter (Signed)
Returned call.  Left message to call back before 5 pm.

## 2014-01-16 NOTE — Telephone Encounter (Signed)
Alert Lab °

## 2014-01-16 NOTE — Telephone Encounter (Signed)
Returned call.  Left message to call back tomorrow before 4pm and that I will try again tomorrow morning.

## 2014-01-16 NOTE — Telephone Encounter (Signed)
[name] w/ [facility] called Critical High Cr 7.80 Repeated and verified.  RN asked if CBC normal and she stated no critical result report.  Will fax report as results not visible in Epic.

## 2014-01-16 NOTE — Telephone Encounter (Signed)
Discussed w/ Cecilie Kicks and Dr. Sallyanne Kuster r/t pt having extensive history and multiple stents placed last week.  Advised STAT CBC, BMP and hemoccult.  DO NOT STOP ANY MEDICATIONS.  Go to ER for dizziness, lightheadedness or worsening symptoms.  Returned call and pt verified x 2.  Pt informed message received and instructed per NP.  Pt verbalized understanding and agreed w/ plan.  Pt will go to [facility] to have labs drawn today.  Pt understands it takes a couple of hours to get results and will go as soon as possible so results can be received by 5pm or he will be notified this evening or tomorrow, depending on if critical or not.    Labs ordered STAT.  RN did Set designer for code for hemoccult [phone]).  Message forwarded to Cecilie Kicks, NP.

## 2014-01-17 ENCOUNTER — Telehealth: Payer: Self-pay

## 2014-01-17 DIAGNOSIS — I739 Peripheral vascular disease, unspecified: Secondary | ICD-10-CM

## 2014-01-17 DIAGNOSIS — M25569 Pain in unspecified knee: Secondary | ICD-10-CM

## 2014-01-17 NOTE — Telephone Encounter (Signed)
Call to pt and verified.  Informed of abnormal results and need to f/u with PCP for evaluation and possible referral.  Informed he cannot stop Plavix or ASA r/t multiple stents placed last week.  Pt verbalized understanding and asked for results to be faxed to the dialysis center where he is right now.   Results faxed per request.

## 2014-01-17 NOTE — Telephone Encounter (Signed)
Pt. Called and reported feeling pressure and discomfort behind the right knee when he stands on his right leg.  Stated that if he sits down, the pressure slowly subsides.  Reports that he has rest pain only if he bends his knee, and then feels the pain behind the knee.  Stated the pain at rest is not as uncomfortable as when he stands up.  Denies any swelling in the right lower extremity.  Will discuss w/ Dr. Trula Slade.

## 2014-01-18 LAB — FECAL OCCULT BLOOD, IMMUNOCHEMICAL: Fecal Occult Blood: POSITIVE — AB

## 2014-01-18 NOTE — Telephone Encounter (Signed)
Rec'd order from Dr. Trula Slade to schedule Arterial Duplex and ABI's with toe pressures of both legs.  Pt. notified of recommendations; scheduled appt. on 01/20/14; agrees w/ plan.

## 2014-01-19 ENCOUNTER — Encounter: Payer: Self-pay | Admitting: Family

## 2014-01-20 ENCOUNTER — Encounter: Payer: Self-pay | Admitting: Family

## 2014-01-20 ENCOUNTER — Ambulatory Visit (INDEPENDENT_AMBULATORY_CARE_PROVIDER_SITE_OTHER)
Admission: RE | Admit: 2014-01-20 | Discharge: 2014-01-20 | Disposition: A | Discharge status: Home / Self Care | Payer: Medicare Other | Source: Ambulatory Visit | Attending: Family | Admitting: Family

## 2014-01-20 ENCOUNTER — Ambulatory Visit (INDEPENDENT_AMBULATORY_CARE_PROVIDER_SITE_OTHER): Payer: Medicare Other | Admitting: Family

## 2014-01-20 ENCOUNTER — Ambulatory Visit (HOSPITAL_COMMUNITY)
Admission: RE | Admit: 2014-01-20 | Discharge: 2014-01-20 | Disposition: A | Discharge status: Home / Self Care | Payer: Medicare Other | Source: Ambulatory Visit | Attending: Family | Admitting: Family

## 2014-01-20 VITALS — BP 133/48 | HR 64 | Resp 16 | Ht 71.0 in | Wt 102.0 lb

## 2014-01-20 DIAGNOSIS — M79609 Pain in unspecified limb: Secondary | ICD-10-CM | POA: Insufficient documentation

## 2014-01-20 DIAGNOSIS — M25569 Pain in unspecified knee: Secondary | ICD-10-CM

## 2014-01-20 DIAGNOSIS — I70219 Atherosclerosis of native arteries of extremities with intermittent claudication, unspecified extremity: Secondary | ICD-10-CM | POA: Insufficient documentation

## 2014-01-20 DIAGNOSIS — I739 Peripheral vascular disease, unspecified: Secondary | ICD-10-CM

## 2014-01-20 NOTE — Progress Notes (Signed)
[facility] [place] HISTORY AND PHYSICAL -PAD  History of Present Illness [name] is a 53 y.o. male patient of Dr. Myra Gianotti who is s/p the following on 01/11/2014: Procedure Performed:  1. ultrasound-guided access, left femoral dialysis graft  2. atherectomy right popliteal artery (above and below knee)  3. Drug coated balloon angioplasty, right popliteal artery (above and below knee)  4. angioplasty right dorsalis pedis artery (anterior tibial artery)  5. intra-arterial administration of nitroglycerin  Indications: The patient suffers from end-stage renal disease. He has bilateral rest pain and ulceration. On 01/03/2014 he underwent interventional left leg (atherectomy left popliteal artery, angioplasty left popliteal artery). He was there that day for treatment of the right leg.  He returns today for C/O Right popliteal area pain and pressure, duration 2-3 days . Vascular Lab Bilateral ABI's and Duplex performed. Posterior right popliteal pain started 4-5 days after the 01/11/14 above procedure, then the next day the pain eased off significantly.  He is wearing a nitroglycerin patch on his right foot, dorsal aspect, states this was started at his 01/11/14 hospitalization and helps the right foot pain ease off to the degree of the left foot pain. He has no pain in calves, thighs, or buttocks with walking, but does have increased pain in feet with walking. The analgesics he takes are helping, oxycodone 5-325, 1-2 tablet q6h prn. He feels like the swelling in both feet have decreased since the recent vascular interventions in both legs, but the ulcers in his feet have not improved much, cannot say if the pain in feet has improved. He gets HD by a permanent access in his left upper thigh, in place for several years. His appetite has been poor lately, he does not seem to have post prandial abdominal pain; he does have diarrhea after eating lately.   Pt Diabetic: No Pt  smoker: former smoker, quit over 20 years ago  Pt meds include: Statin :Yes Betablocker: Yes ASA: No Other anticoagulants/antiplatelets: Plavix  Past Medical History  Diagnosis Date  . ESRD on hemodialysis     ESRD with renal transplant in 1987 (cadaveric) after short period of dialysis.  Transplant failed in 2004 and he went back on hemodialysis. As of April 2015 getting HD via L thigh AVG on a TTS schedule at [facility] on [facility].   Cause of ESRD was HTN.    Marland Kitchen Hypertension   . Hx of kidney transplant     1987 > 2004  . Gout tophi   . Chronic steroid use     Has severe gout. Did not tolerate Allopurinol. Do not taper per PCP   . Systolic CHF, chronic     2 D echo 04/2012 with EF of 45 %   . Malnutrition   . 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 F/U with Dr Dwain Sarna. Started on TNA  . Anemia associated with chronic renal failure   . Hepatitis C   . Heart murmur   . GERD (gastroesophageal reflux disease)   . H/O hiatal hernia   . Arthritis   . DVT (deep venous thrombosis)   . Peripheral vascular disease   . Prostate cancer     Social History History  Substance Use Topics  . Smoking status: Former Smoker -- 1.00 packs/day for 10 years    Types: Cigarettes    Quit date: 03/23/1973  . Smokeless tobacco:  Never Used     Comment: Quit 30 years ago  . Alcohol Use: No    Family History Family History  Problem Relation Age of Onset  . Hypertension Father   . Pneumonia Brother   . Lung disease Brother     Past Surgical History  Procedure Laterality Date  . Laparotomy  04/14/2012    Procedure: EXPLORATORY LAPAROTOMY;  Surgeon: Joyice Faster. Cornett, MD;  Location: [place];  Service: General;  Laterality: N/A;  . Colon resection  04/14/2012  . Laparotomy  04/22/2012    Procedure: EXPLORATORY LAPAROTOMY;  Surgeon: Adin Hector, MD;  Location: [place];  Service: General;   Laterality: N/A;  lysis of adhesions, omentoplasty, repair small bowel  . Thrombectomy w/ embolectomy  04/27/2012    Procedure: THROMBECTOMY ARTERIOVENOUS GORE-TEX GRAFT;  Surgeon: Angelia Mould, MD;  Location: [place];  Service: Vascular;  Laterality: Left;  Thrombectomy of left thigh arteriovenous gortex graft  . Prostectomy  2011  . Renal grafts    . Pelvic abcess drainage Right 6/14    removal drain s/p bowl resection 13  . Transanal excision of rectal mass N/A 03/30/2013    Procedure: EXCISION OF anal MASS;  Surgeon: Joyice Faster. Cornett, MD;  Location: [place];  Service: General;  Laterality: N/A;  Exam under anesthesia with excision anal verge mass    Allergies  Allergen Reactions  . Allopurinol Other (See Comments)    REACTION: decreased platelets    Current Outpatient Prescriptions  Medication Sig Dispense Refill  . amLODipine (NORVASC) 10 MG tablet Take 1 tablet (10 mg total) by mouth daily.  30 tablet  6  . atorvastatin (LIPITOR) 40 MG tablet Take 1 tablet (40 mg total) by mouth daily at 6 PM.  30 tablet  6  . calcium acetate (PHOSLO) 667 MG tablet Take 2,001 mg by mouth 3 (three) times daily.       . carvedilol (COREG) 6.25 MG tablet Take 1 tablet (6.25 mg total) by mouth 2 (two) times daily with a meal.  60 tablet  6  . cinacalcet (SENSIPAR) 60 MG tablet Take 60 mg by mouth daily.      . clopidogrel (PLAVIX) 75 MG tablet Take 1 tablet (75 mg total) by mouth daily with breakfast.  30 tablet  10  . COLCRYS 0.6 MG tablet Take 0.6 mg by mouth daily.       Marland Kitchen LIDOCAINE EX Apply 1 application topically See admin instructions. Apply to inject site on dialysis days (Tues, Thur, Sat) if needed for pain      . loperamide (IMODIUM A-D) 2 MG tablet Take 2 mg by mouth daily as needed. For diarrhea      . loperamide (IMODIUM A-D) 2 MG tablet Take by mouth continuous as needed.      . multivitamin (RENA-VIT) TABS tablet Take 1 tablet by mouth daily.  30 tablet  0  . nitroGLYCERIN (NITRODUR -  DOSED IN MG/24 HR) 0.3 mg/hr patch Place 1 patch (0.3 mg total) onto the skin daily.  30 patch  12  . oxyCODONE-acetaminophen (PERCOCET/ROXICET) 5-325 MG per tablet Take 1 tablet by mouth every 6 (six) hours as needed. For pain  30 tablet  0  . pantoprazole (PROTONIX) 40 MG tablet Take 1 tablet (40 mg total) by mouth daily.  30 tablet  6  . predniSONE (DELTASONE) 5 MG tablet Take 5 mg by mouth daily.      . calcium acetate (PHOSLO) 667 MG capsule Take  1,334 mg by mouth as needed (with snacks).       No current facility-administered medications for this visit.   Facility-Administered Medications Ordered in Other Visits  Medication Dose Route Frequency Provider Last Rate Last Dose  . fentaNYL (SUBLIMAZE) injection   Intravenous PRN Carylon Perches, MD   25 mcg at 05/05/12 1544  . midazolam (VERSED) 5 MG/5ML injection   Intravenous PRN Carylon Perches, MD   2 mg at 05/05/12 1544    ROS: See HPI for pertinent positives and negatives.   Physical Examination   Filed Vitals:   01/20/14 1132  BP: 133/48  Pulse: 64  Resp: 16  Height: 5\' 11"  (1.803 m)  Weight: 102 lb (46.267 kg)  SpO2: 100%  Body mass index is 14.23 kg/(m^2).  General: A&O x 3, WDWN, thin. Gait: limp Eyes: PERRLA. Pulmonary: CTAB, without wheezes , rales or rhonchi. Cardiac: regular Rythm , without detected murmur.         Aorta is not palpable. Radial pulses: are 1+ palpable and =.                           VASCULAR EXAM: Extremities with ischemic changes  with Gangrene in toes of both feet; with open wounds.                                                                                                          LE Pulses LEFT RIGHT       FEMORAL   palpable   palpable        POPLITEAL  not palpable   not palpable       POSTERIOR TIBIAL  not palpable   not palpable        DORSALIS PEDIS      ANTERIOR TIBIAL not palpable  not palpable    Abdomen: soft, NT, no masses. Skin: no rashes, ulcers noted, see  extremities. Musculoskeletal: significant generalized muscle wasting or atrophy.  Neurologic: A&O X 3; Appropriate Affect ; SENSATION: normal; MOTOR FUNCTION:  moving all extremities equally, motor strength 4/5 in UE's, 3/5 in LE's. Speech is fluent/normal. CN 2-12 grossly intact.    Non-Invasive Vascular Imaging: DATE: 01/20/2014 ABI: RIGHT [place], Waveforms: monophasic;  LEFT [place], Waveforms: monophasic DUPLEX SCAN OF RIGHT LOWER EXTREMITY:  229 cm/sec velocity at CFA, just above bifurcation of profunda and SFA; 236 velocity just above bifurcation of AT and tibial trunk.  ASSESSMENT: [name] is a 53 y.o. male who presents with significantly improved right posterior popliteal pain post atherectomy and balloon angioplasty right popliteal artery. Duplex of right leg shows right popliteal artery as stenotic but open. Calcified arteries consistent with ESRD, non compressible arteries at ankles, unable to obtain ABI's, monophasic waveforms. TBI's are 0., several small gangrenous areas at tips of most toes.  PLAN:  I discussed in depth with the patient the nature of atherosclerosis, and emphasized the importance of maximal medical management including strict control of blood pressure, blood glucose, and lipid levels, obtaining regular exercise,  and continued cessation of smoking.  The patient is aware that without maximal medical management the underlying atherosclerotic disease process will progress, limiting the benefit of any interventions.  Based on the patient's vascular studies and examination, and after discussing with Dr. Oneida Alar,  pt will return to clinic in 2 weeks and see Dr. Trula Slade.  The patient was given information about PAD including signs, symptoms, treatment, what symptoms should prompt the patient to seek immediate medical care, and risk reduction measures to take.  Clemon Chambers, RN, MSN, FNP-C [facility] Phone: [phone]  Clinic MD:  Oneida Alar on call  01/20/2014 11:38 AM

## 2014-01-20 NOTE — Patient Instructions (Signed)
Peripheral Vascular Disease Peripheral Vascular Disease (PVD), also called Peripheral Arterial Disease (PAD), is a circulation problem caused by cholesterol (atherosclerotic plaque) deposits in the arteries. PVD commonly occurs in the lower extremities (legs) but it can occur in other areas of the body, such as your arms. The cholesterol buildup in the arteries reduces blood flow which can cause pain and other serious problems. The presence of PVD can place a person at risk for Coronary Artery Disease (CAD).  CAUSES  Causes of PVD can be many. It is usually associated with more than one risk factor such as:   High Cholesterol.  Smoking.  Diabetes.  Lack of exercise or inactivity.  High blood pressure (hypertension).  Obesity.  Family history. SYMPTOMS   When the lower extremities are affected, patients with PVD may experience:  Leg pain with exertion or physical activity. This is called INTERMITTENT CLAUDICATION. This may present as cramping or numbness with physical activity. The location of the pain is associated with the level of blockage. For example, blockage at the abdominal level (distal abdominal aorta) may result in buttock or hip pain. Lower leg arterial blockage may result in calf pain.  As PVD becomes more severe, pain can develop with less physical activity.  In people with severe PVD, leg pain may occur at rest.  Other PVD signs and symptoms:  Leg numbness or weakness.  Coldness in the affected leg or foot, especially when compared to the other leg.  A change in leg color.  Patients with significant PVD are more prone to ulcers or sores on toes, feet or legs. These may take longer to heal or may reoccur. The ulcers or sores can become infected.  If signs and symptoms of PVD are ignored, gangrene may occur. This can result in the loss of toes or loss of an entire limb.  Not all leg pain is related to PVD. Other medical conditions can cause leg pain such  as:  Blood clots (embolism) or Deep Vein Thrombosis.  Inflammation of the blood vessels (vasculitis).  Spinal stenosis. DIAGNOSIS  Diagnosis of PVD can involve several different types of tests. These can include:  Pulse Volume Recording Method (PVR). This test is simple, painless and does not involve the use of X-rays. PVR involves measuring and comparing the blood pressure in the arms and legs. An ABI (Ankle-Brachial Index) is calculated. The normal ratio of blood pressures is 1. As this number becomes smaller, it indicates more severe disease.  < 0.95  indicates significant narrowing in one or more leg vessels.  <0.8 there will usually be pain in the foot, leg or buttock with exercise.  <0.4 will usually have pain in the legs at rest.  <0.25  usually indicates limb threatening PVD.  Doppler detection of pulses in the legs. This test is painless and checks to see if you have a pulses in your legs/feet.  A dye or contrast material (a substance that highlights the blood vessels so they show up on x-ray) may be given to help your caregiver better see the arteries for the following tests. The dye is eliminated from your body by the kidney's. Your caregiver may order blood work to check your kidney function and other laboratory values before the following tests are performed:  Magnetic Resonance Angiography (MRA). An MRA is a picture study of the blood vessels and arteries. The MRA machine uses a large magnet to produce images of the blood vessels.  Computed Tomography Angiography (CTA). A CTA is a   specialized x-ray that looks at how the blood flows in your blood vessels. An IV may be inserted into your arm so contrast dye can be injected.  Angiogram. Is a procedure that uses x-rays to look at your blood vessels. This procedure is minimally invasive, meaning a small incision (cut) is made in your groin. A small tube (catheter) is then inserted into the artery of your groin. The catheter is  guided to the blood vessel or artery your caregiver wants to examine. Contrast dye is injected into the catheter. X-rays are then taken of the blood vessel or artery. After the images are obtained, the catheter is taken out. TREATMENT  Treatment of PVD involves many interventions which may include:  Lifestyle changes:  Quitting smoking.  Exercise.  Following a low fat, low cholesterol diet.  Control of diabetes.  Foot care is very important to the PVD patient. Good foot care can help prevent infection.  Medication:  Cholesterol-lowering medicine.  Blood pressure medicine.  Anti-platelet drugs.  Certain medicines may reduce symptoms of Intermittent Claudication.  Interventional/Surgical options:  Angioplasty. An Angioplasty is a procedure that inflates a balloon in the blocked artery. This opens the blocked artery to improve blood flow.  Stent Implant. A wire mesh tube (stent) is placed in the artery. The stent expands and stays in place, allowing the artery to remain open.  Peripheral Bypass Surgery. This is a surgical procedure that reroutes the blood around a blocked artery to help improve blood flow. This type of procedure may be performed if Angioplasty or stent implants are not an option. SEEK IMMEDIATE MEDICAL CARE IF:   You develop pain or numbness in your arms or legs.  Your arm or leg turns cold, becomes blue in color.  You develop redness, warmth, swelling and pain in your arms or legs. MAKE SURE YOU:   Understand these instructions.  Will watch your condition.  Will get help right away if you are not doing well or get worse. Document Released: 10/16/2004 Document Revised: 12/01/2011 Document Reviewed: 09/12/2008 ExitCare Patient Information 2014 [employer].  

## 2014-01-25 ENCOUNTER — Telehealth: Payer: Self-pay | Admitting: *Deleted

## 2014-01-25 NOTE — Telephone Encounter (Signed)
Informed patient of + heme stool. Will do GI referral. Patient does not have a PCP. Has sen a GI doctor in the past. Per patient he has history of bowel obstructions. He does not remember the doctor that he saw. I will try to find out this information. If I cannot find his previous GI MD, I will do a new referral. Patient agreed plan. He also informed me that his symptoms has cleared up. He does not see anymore blood.

## 2014-01-25 NOTE — Telephone Encounter (Signed)
Message copied by Lauralee Evener on Wed Jan 25, 2014 12:11 PM ------      Message from: [name]      Created: Wed Jan 18, 2014  8:00 AM       Please make sure pt went to PCP or he needs gi consult with GI bleed on plavix and ASA that he cannot stop new stents. ------

## 2014-01-27 ENCOUNTER — Encounter: Payer: Self-pay | Admitting: Surgery

## 2014-01-27 ENCOUNTER — Encounter (HOSPITAL_COMMUNITY): Payer: Medicare Other

## 2014-01-27 ENCOUNTER — Other Ambulatory Visit (HOSPITAL_COMMUNITY): Payer: Medicare Other

## 2014-01-27 ENCOUNTER — Ambulatory Visit: Payer: Medicare Other | Admitting: Vascular Surgery

## 2014-01-30 ENCOUNTER — Encounter: Payer: Self-pay | Admitting: Surgery

## 2014-01-30 ENCOUNTER — Ambulatory Visit (INDEPENDENT_AMBULATORY_CARE_PROVIDER_SITE_OTHER): Payer: Medicare Other | Admitting: Surgery

## 2014-01-30 VITALS — BP 150/63 | HR 72 | Ht 71.0 in | Wt 103.0 lb

## 2014-01-30 DIAGNOSIS — I7025 Atherosclerosis of native arteries of other extremities with ulceration: Secondary | ICD-10-CM | POA: Insufficient documentation

## 2014-01-30 DIAGNOSIS — L98499 Non-pressure chronic ulcer of skin of other sites with unspecified severity: Principal | ICD-10-CM

## 2014-01-30 DIAGNOSIS — I739 Peripheral vascular disease, unspecified: Secondary | ICD-10-CM | POA: Insufficient documentation

## 2014-01-30 NOTE — Progress Notes (Signed)
Patient name: [name] MRN: [id number] DOB: Jun 28, 1961 Sex: male     Chief Complaint  Patient presents with  . Re-evaluation    2 wk f/u - states that "pain is better now"    HISTORY OF PRESENT ILLNESS: The patient is back today for followup.  He initially presented with bilateral lower extremity ulcers on both great toes.  On 01/03/2014, he underwent atherectomy and angioplasty of the left popliteal artery.  On 01/11/2014, he underwent atherectomy with drug coded balloon angioplasty of the right popliteal artery as well as angioplasty of the right dorsalis pedis artery.  After each procedure the patient developed chest pain.  He ultimately underwent coronary stenting.  He was seen in my office 2 weeks ago with pain behind his right knee.  Ultrasound did not show any acute pathology.  His intervention of the right leg remained patent.  His pain has now improved.  Past Medical History  Diagnosis Date  . ESRD on hemodialysis     ESRD with renal transplant in 1987 (cadaveric) after short period of dialysis.  Transplant failed in 2004 and he went back on hemodialysis. As of April 2015 getting HD via L thigh AVG on a TTS schedule at [facility] on [facility].   Cause of ESRD was HTN.    Marland Kitchen Hypertension   . Hx of kidney transplant     1987 > 2004  . Gout tophi   . Chronic steroid use     Has severe gout. Did not tolerate Allopurinol. Do not taper per PCP   . Systolic CHF, chronic     2 D echo 04/2012 with EF of 45 %   . Malnutrition   . 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 F/U with Dr Donne Hazel. Started on TNA  . Anemia associated with chronic renal failure   . Hepatitis C   . Heart murmur   . GERD (gastroesophageal reflux disease)   . H/O hiatal hernia   . Arthritis   . DVT (deep venous thrombosis)   . Peripheral vascular disease   . Prostate cancer     Past Surgical History    Procedure Laterality Date  . Laparotomy  04/14/2012    Procedure: EXPLORATORY LAPAROTOMY;  Surgeon: Joyice Faster. Cornett, MD;  Location: [place];  Service: General;  Laterality: N/A;  . Colon resection  04/14/2012  . Laparotomy  04/22/2012    Procedure: EXPLORATORY LAPAROTOMY;  Surgeon: Adin Hector, MD;  Location: [place];  Service: General;  Laterality: N/A;  lysis of adhesions, omentoplasty, repair small bowel  . Thrombectomy w/ embolectomy  04/27/2012    Procedure: THROMBECTOMY ARTERIOVENOUS GORE-TEX GRAFT;  Surgeon: Angelia Mould, MD;  Location: [place];  Service: Vascular;  Laterality: Left;  Thrombectomy of left thigh arteriovenous gortex graft  . Prostectomy  2011  . Renal grafts    . Pelvic abcess drainage Right 6/14    removal drain s/p bowl resection 13  . Transanal excision of rectal mass N/A 03/30/2013    Procedure: EXCISION OF anal MASS;  Surgeon: Joyice Faster. Cornett, MD;  Location: [place];  Service: General;  Laterality: N/A;  Exam under anesthesia with excision anal verge mass    History   Social History  . Marital Status: Single    Spouse Name: N/A    Number of Children: N/A  . Years of Education: N/A  Occupational History  . Not on file.   Social History Main Topics  . Smoking status: Former Smoker -- 1.00 packs/day for 10 years    Types: Cigarettes    Quit date: 03/23/1973  . Smokeless tobacco: Never Used     Comment: Quit 30 years ago  . Alcohol Use: No  . Drug Use: No  . Sexual Activity: No   Other Topics Concern  . Not on file   Social History Narrative   Currently in the nursing home until end of the month then will go to live with sister.           Family History  Problem Relation Age of Onset  . Hypertension Father   . Pneumonia Brother   . Lung disease Brother     Allergies as of 01/30/2014 - Review Complete 01/30/2014  Allergen Reaction Noted  . Allopurinol Other (See Comments) 02/27/2010    Current Outpatient Prescriptions on File Prior  to Visit  Medication Sig Dispense Refill  . amLODipine (NORVASC) 10 MG tablet Take 1 tablet (10 mg total) by mouth daily.  30 tablet  6  . atorvastatin (LIPITOR) 40 MG tablet Take 1 tablet (40 mg total) by mouth daily at 6 PM.  30 tablet  6  . calcium acetate (PHOSLO) 667 MG capsule Take 1,334 mg by mouth as needed (with snacks).      . calcium acetate (PHOSLO) 667 MG tablet Take 2,001 mg by mouth 3 (three) times daily.       . carvedilol (COREG) 6.25 MG tablet Take 1 tablet (6.25 mg total) by mouth 2 (two) times daily with a meal.  60 tablet  6  . cinacalcet (SENSIPAR) 60 MG tablet Take 60 mg by mouth daily.      . clopidogrel (PLAVIX) 75 MG tablet Take 1 tablet (75 mg total) by mouth daily with breakfast.  30 tablet  10  . COLCRYS 0.6 MG tablet Take 0.6 mg by mouth daily.       Marland Kitchen LIDOCAINE EX Apply 1 application topically See admin instructions. Apply to inject site on dialysis days (Tues, Thur, Sat) if needed for pain      . loperamide (IMODIUM A-D) 2 MG tablet Take 2 mg by mouth daily as needed. For diarrhea      . loperamide (IMODIUM A-D) 2 MG tablet Take by mouth continuous as needed.      . multivitamin (RENA-VIT) TABS tablet Take 1 tablet by mouth daily.  30 tablet  0  . nitroGLYCERIN (NITRODUR - DOSED IN MG/24 HR) 0.3 mg/hr patch Place 1 patch (0.3 mg total) onto the skin daily.  30 patch  12  . oxyCODONE-acetaminophen (PERCOCET/ROXICET) 5-325 MG per tablet Take 1 tablet by mouth every 6 (six) hours as needed. For pain  30 tablet  0  . pantoprazole (PROTONIX) 40 MG tablet Take 1 tablet (40 mg total) by mouth daily.  30 tablet  6  . predniSONE (DELTASONE) 5 MG tablet Take 5 mg by mouth daily.       Current Facility-Administered Medications on File Prior to Visit  Medication Dose Route Frequency Provider Last Rate Last Dose  . fentaNYL (SUBLIMAZE) injection   Intravenous PRN Carylon Perches, MD   25 mcg at 05/05/12 1544  . midazolam (VERSED) 5 MG/5ML injection   Intravenous PRN Carylon Perches, MD   2 mg at 05/05/12 1544     REVIEW OF SYSTEMS: Please see history of present illness, otherwise negative  PHYSICAL EXAMINATION:   Vital signs are BP 150/63  Pulse 72  Ht 5\' 11"  (1.803 m)  Wt 103 lb (46.72 kg)  BMI 14.37 kg/m2  SpO2 100% General: The patient appears their stated age. HEENT:  No gross abnormalities Pulmonary:  Non labored breathing Abdomen: Soft and non-tender Musculoskeletal: There are no major deformities. Neurologic: No focal weakness or paresthesias are detected, Skin: Dry eschar noted over the right great toe.  He remains in eschar on the right second toe.  Ischemic changes to the tip of the left great toe.  No evidence of infection. Psychiatric: The patient has normal affect. Cardiovascular: There is a regular rate and rhythm without significant murmur appreciated.    Assessment: Bilateral ulcers, status post bilateral percutaneous intervention Plan: The patient's feet are improved from when I last saw him.  I think he is making great progress.  I recommend just dry dressing changes at this time as his open wounds have changed to a dry eschar.  If he develops a new wound or infection he will contact me.  Otherwise I will see him back in one month.  He will need an ultrasound in 3 months  V. Leia Alf, M.D. [facility] [place] Office: [phone] Pager:  [phone]

## 2014-02-17 ENCOUNTER — Telehealth: Payer: Self-pay | Admitting: *Deleted

## 2014-02-17 NOTE — Telephone Encounter (Signed)
Spoke with patient in reference to his GI referral. He informed me that he saw a Dr. Brantley Stage and plans to go back to him.

## 2014-02-22 ENCOUNTER — Encounter: Payer: Self-pay | Admitting: Cardiology

## 2014-02-22 ENCOUNTER — Ambulatory Visit (INDEPENDENT_AMBULATORY_CARE_PROVIDER_SITE_OTHER): Payer: Medicare Other | Admitting: Cardiology

## 2014-02-22 VITALS — BP 152/64 | HR 61 | Ht 71.0 in | Wt 104.0 lb

## 2014-02-22 DIAGNOSIS — I10 Essential (primary) hypertension: Secondary | ICD-10-CM

## 2014-02-22 DIAGNOSIS — I70219 Atherosclerosis of native arteries of extremities with intermittent claudication, unspecified extremity: Secondary | ICD-10-CM

## 2014-02-22 DIAGNOSIS — I70229 Atherosclerosis of native arteries of extremities with rest pain, unspecified extremity: Secondary | ICD-10-CM

## 2014-02-22 DIAGNOSIS — Z992 Dependence on renal dialysis: Secondary | ICD-10-CM

## 2014-02-22 DIAGNOSIS — I2510 Atherosclerotic heart disease of native coronary artery without angina pectoris: Secondary | ICD-10-CM

## 2014-02-22 DIAGNOSIS — N186 End stage renal disease: Secondary | ICD-10-CM

## 2014-02-22 DIAGNOSIS — I739 Peripheral vascular disease, unspecified: Secondary | ICD-10-CM

## 2014-02-22 DIAGNOSIS — I998 Other disorder of circulatory system: Secondary | ICD-10-CM | POA: Insufficient documentation

## 2014-02-22 DIAGNOSIS — I999 Unspecified disorder of circulatory system: Secondary | ICD-10-CM

## 2014-02-22 DIAGNOSIS — Z8249 Family history of ischemic heart disease and other diseases of the circulatory system: Secondary | ICD-10-CM

## 2014-02-22 NOTE — Patient Instructions (Signed)
[name] has recommended making the following medication changes:  STOP Toprol (the bottle with an 'x' on it)  START Aspirin 81 mg - take 1 tablet daily  Your physician recommends that you schedule a follow-up appointment in 3 months with Dr Claiborne Billings.

## 2014-02-22 NOTE — Assessment & Plan Note (Signed)
Under better control.

## 2014-02-22 NOTE — Assessment & Plan Note (Signed)
Admitted 01/11/14 with cellulitis and critical LE ischemia

## 2014-02-22 NOTE — Assessment & Plan Note (Signed)
No angina 

## 2014-02-22 NOTE — Assessment & Plan Note (Signed)
Rt LE PTA by Dr Trula Slade 01/11/14

## 2014-02-22 NOTE — Progress Notes (Signed)
02/22/2014 [name]   02/11/1961  [id number]  Primary Physicia DETERDING,JAMES L, MD Primary Cardiologist: Dr Claiborne Billings  HPI:  Pt is a 53 y.o. male with no history of CAD. He has severe PVD, ESRD on HD w/ failed transplant, prostate CA with surgery and complications requiring more surgeries in 2013, Hepatitis C, malnutrition, and severe PVD with dry gangrene.          He was hospitalized 04/14-04/16 when he was admitted for PV angiogram and had PTA with atherectomy to the left popliteal artery. He was seen by cardiology that admission for chest pain, an echocardiogram was performed with normal LV function.          He came back to the hospital for PV intervention on his right popliteal 01/11/14. He had atherectomy x 2 to the right popliteal, balloon angioplasty x 2 to the right popliteal and angioplasty to the right dorsalis pedis (anterior tibial artery) by Dr Trula Slade. Post-procedure, he developed chest pain, in the setting of SBP > 240, ECG is abnormal and cardiology was asked to evaluate.  The pt was treated for accelerated HTN and unstable angina. He had diagnostic cath on 01/12/14 showing RCA disease. He underwent elective rotablator atherectomy and DES x 3 proximal and mid RCA on 01/13/14 by Dr Julianne Handler.             He is in the office today for follow up. He denies angina. He is followed by Dr Trula Slade as an OP for his PVD.      Current Outpatient Prescriptions  Medication Sig Dispense Refill  . amLODipine (NORVASC) 5 MG tablet Take 5 mg by mouth daily.      Marland Kitchen aspirin EC 81 MG tablet Take 81 mg by mouth daily.      Marland Kitchen atorvastatin (LIPITOR) 40 MG tablet Take 1 tablet (40 mg total) by mouth daily at 6 PM.  30 tablet  6  . calcium acetate (PHOSLO) 667 MG capsule Take 1,334 mg by mouth as needed (with snacks).      . calcium acetate (PHOSLO) 667 MG tablet Take 2,001 mg by mouth 3 (three) times daily.       . carvedilol (COREG) 6.25 MG tablet Take 1 tablet (6.25 mg total) by mouth 2 (two)  times daily with a meal.  60 tablet  6  . cinacalcet (SENSIPAR) 60 MG tablet Take 60 mg by mouth daily.      . clopidogrel (PLAVIX) 75 MG tablet Take 1 tablet (75 mg total) by mouth daily with breakfast.  30 tablet  10  . COLCRYS 0.6 MG tablet Take 0.6 mg by mouth daily.       Marland Kitchen LIDOCAINE EX Apply 1 application topically See admin instructions. Apply to inject site on dialysis days (Tues, Thur, Sat) if needed for pain      . loperamide (IMODIUM A-D) 2 MG tablet Take 2 mg by mouth daily as needed. For diarrhea      . multivitamin (RENA-VIT) TABS tablet Take 1 tablet by mouth daily.  30 tablet  0  . nitroGLYCERIN (NITRODUR - DOSED IN MG/24 HR) 0.3 mg/hr patch Place 1 patch (0.3 mg total) onto the skin daily.  30 patch  12  . oxyCODONE-acetaminophen (PERCOCET/ROXICET) 5-325 MG per tablet Take 1 tablet by mouth every 6 (six) hours as needed. For pain  30 tablet  0  . pantoprazole (PROTONIX) 40 MG tablet Take 1 tablet (40 mg total) by mouth daily.  30 tablet  6  . predniSONE (DELTASONE) 5 MG tablet Take 5 mg by mouth daily.       No current facility-administered medications for this visit.   Facility-Administered Medications Ordered in Other Visits  Medication Dose Route Frequency Provider Last Rate Last Dose  . fentaNYL (SUBLIMAZE) injection   Intravenous PRN Carylon Perches, MD   25 mcg at 05/05/12 1544  . midazolam (VERSED) 5 MG/5ML injection   Intravenous PRN Carylon Perches, MD   2 mg at 05/05/12 1544    Allergies  Allergen Reactions  . Allopurinol Other (See Comments)    REACTION: decreased platelets    History   Social History  . Marital Status: Single    Spouse Name: N/A    Number of Children: N/A  . Years of Education: N/A   Occupational History  . Not on file.   Social History Main Topics  . Smoking status: Former Smoker -- 1.00 packs/day for 10 years    Types: Cigarettes    Quit date: 03/23/1973  . Smokeless tobacco: Never Used     Comment: Quit 30 years ago  . Alcohol  Use: No  . Drug Use: No  . Sexual Activity: No   Other Topics Concern  . Not on file   Social History Narrative   Currently in the nursing home until end of the month then will go to live with sister.            Review of Systems: General: negative for chills, fever, night sweats or weight changes.  Cardiovascular: negative for chest pain, dyspnea on exertion, edema, orthopnea, palpitations, paroxysmal nocturnal dyspnea or shortness of breath Dermatological: negative for rash Respiratory: negative for cough or wheezing Urologic: negative for hematuria Abdominal: negative for nausea, vomiting, diarrhea, bright red blood per rectum, melena, or hematemesis Neurologic: negative for visual changes, syncope, or dizziness All other systems reviewed and are otherwise negative except as noted above.    Blood pressure 152/64, pulse 61, height 5\' 11"  (1.803 m), weight 104 lb (47.174 kg).  General appearance: alert, cooperative, cachectic and no distress Lungs: clear to auscultation bilaterally Heart: regular rate and rhythm Extremities: he has dry gangrene on his Lt great toe tip. His Rt foot is doing better per the pt.   EKG NSR LVH  ASSESSMENT AND PLAN:   Critical lower limb ischemia Admitted 01/11/14 with cellulitis and critical LE ischemia  PVD (peripheral vascular disease) Rt LE PTA by Dr Trula Slade 01/11/14  CAD- unstable angina after PTA - s/p RCA DES 01/13/14 No angina  ESRD on hemodialysis HD [facility]Th[facility]  HTN (hypertension) Under better control   PLAN  The pt was on Coreg and Toprol- I stopped the Toprol. I also suggested he take an 81 mg ASA. I stressed the importance of uninterrupted Plavix Rx. He can see Dr Lenward Chancellor in 3 months.   Doreene Burke [facility] 02/22/2014 10:29 AM

## 2014-02-22 NOTE — Assessment & Plan Note (Signed)
HD T/Th/Sat at [address] 

## 2014-03-06 ENCOUNTER — Ambulatory Visit (INDEPENDENT_AMBULATORY_CARE_PROVIDER_SITE_OTHER): Payer: Medicare Other | Admitting: Surgery

## 2014-03-06 ENCOUNTER — Encounter (INDEPENDENT_AMBULATORY_CARE_PROVIDER_SITE_OTHER): Payer: Self-pay | Admitting: Surgery

## 2014-03-06 ENCOUNTER — Ambulatory Visit: Payer: Medicare Other | Admitting: Surgery

## 2014-03-06 VITALS — BP 132/70 | HR 74 | Resp 12 | Ht 71.0 in | Wt 105.2 lb

## 2014-03-06 DIAGNOSIS — A630 Anogenital (venereal) warts: Secondary | ICD-10-CM

## 2014-03-06 NOTE — Patient Instructions (Signed)
Return 6 months.  Will have you follow up with Dr Marcello Moores colorectal specialist for anal condyloma /  Dysplasia. No further intraabdominal issues.

## 2014-03-06 NOTE — Progress Notes (Signed)
Subjective:     Patient ID: [name], male   DOB: 06/29/1961, 53 y.o.   MRN: [id number]  HPI  patient returns for followup for his anal condyloma. 9 months ago he underwent excision which showed grade 3 anal intraepithelial neoplasia. 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 He has regained most of his weight. He feels and  looks well.  Doing well.  Review of Systems  Respiratory: Negative.   Cardiovascular: Negative.   Gastrointestinal: Negative.        Objective:   Physical Exam  Abdominal: Soft. Bowel sounds are normal. He exhibits no distension. There is no tenderness. There is no rebound and no guarding.    Genitourinary:     Skin: Skin is warm and dry.  Psychiatric: He has a normal mood and affect. His behavior is normal. Judgment and thought content normal.  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 We'll have him return in 6 months to see Dr. Marcello Moores for followup. His abdominal issues have resolved and he is doing well. No evidence of any more dark stools. Continue to follow.  If this recurs recommend GI consult.  Anal canal looks good but may need high resolution anoscopy in the future

## 2014-03-10 ENCOUNTER — Encounter: Payer: Self-pay | Admitting: Surgery

## 2014-03-13 ENCOUNTER — Ambulatory Visit (INDEPENDENT_AMBULATORY_CARE_PROVIDER_SITE_OTHER): Payer: Medicare Other | Admitting: Surgery

## 2014-03-13 ENCOUNTER — Encounter: Payer: Self-pay | Admitting: Surgery

## 2014-03-13 VITALS — BP 143/69 | HR 72 | Ht 71.0 in | Wt 105.7 lb

## 2014-03-13 DIAGNOSIS — L98499 Non-pressure chronic ulcer of skin of other sites with unspecified severity: Principal | ICD-10-CM

## 2014-03-13 DIAGNOSIS — I739 Peripheral vascular disease, unspecified: Secondary | ICD-10-CM

## 2014-03-13 NOTE — Addendum Note (Signed)
Addended by: Mena Goes on: 03/13/2014 11:48 AM   Modules accepted: Orders

## 2014-03-13 NOTE — Progress Notes (Signed)
Patient name: [name] MRN: [id number] DOB: 04/21/1961 Sex: male     Chief Complaint  Patient presents with  . Re-evaluation    1 month f/u     HISTORY OF PRESENT ILLNESS: The patient is back today for followup. He initially presented with bilateral lower extremity ulcers on both great toes. On 01/03/2014, he underwent atherectomy and angioplasty of the left popliteal artery. On 01/11/2014, he underwent atherectomy with drug coded balloon angioplasty of the right popliteal artery as well as angioplasty of the right dorsalis pedis artery. After each procedure the patient developed chest pain. He ultimately underwent coronary stenting.  I have been following him for bilateral wound on his feet.  He has had an ultrasound in the early post treatment time.  Which showed a patent interventions bilaterally.  He denies any fevers.  There is been no significant change within his pain.   Past Medical History  Diagnosis Date  . ESRD on hemodialysis     ESRD with renal transplant in 1987 (cadaveric) after short period of dialysis.  Transplant failed in 2004 and he went back on hemodialysis. As of April 2015 getting HD via L thigh AVG on a TTS schedule at [facility] on [facility].   Cause of ESRD was HTN.    Marland Kitchen Hypertension   . Hx of kidney transplant     1987 > 2004  . Gout tophi   . Chronic steroid use     Has severe gout. Did not tolerate Allopurinol. Do not taper per PCP   . Systolic CHF, chronic     2 D echo 04/2012 with EF of 45 %   . Malnutrition   . 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 F/U with Dr Donne Hazel. Started on TNA  . Anemia associated with chronic renal failure   . Hepatitis C   . Heart murmur   . GERD (gastroesophageal reflux disease)   . H/O hiatal hernia   . Arthritis   . DVT (deep venous thrombosis)   . Peripheral vascular disease   . Prostate cancer     Past  Surgical History  Procedure Laterality Date  . Laparotomy  04/14/2012    Procedure: EXPLORATORY LAPAROTOMY;  Surgeon: Joyice Faster. Cornett, MD;  Location: [place];  Service: General;  Laterality: N/A;  . Colon resection  04/14/2012  . Laparotomy  04/22/2012    Procedure: EXPLORATORY LAPAROTOMY;  Surgeon: Adin Hector, MD;  Location: [place];  Service: General;  Laterality: N/A;  lysis of adhesions, omentoplasty, repair small bowel  . Thrombectomy w/ embolectomy  04/27/2012    Procedure: THROMBECTOMY ARTERIOVENOUS GORE-TEX GRAFT;  Surgeon: Angelia Mould, MD;  Location: [place];  Service: Vascular;  Laterality: Left;  Thrombectomy of left thigh arteriovenous gortex graft  . Prostectomy  2011  . Renal grafts    . Pelvic abcess drainage Right 6/14    removal drain s/p bowl resection 13  . Transanal excision of rectal mass N/A 03/30/2013    Procedure: EXCISION OF anal MASS;  Surgeon: Joyice Faster. Cornett, MD;  Location: [place];  Service: General;  Laterality: N/A;  Exam under anesthesia with excision anal verge mass    History   Social History  . Marital Status: Single    Spouse Name: N/A    Number of Children: N/A  . Years of Education: N/A   Occupational History  .  Not on file.   Social History Main Topics  . Smoking status: Former Smoker -- 1.00 packs/day for 10 years    Types: Cigarettes    Quit date: 03/23/1973  . Smokeless tobacco: Never Used     Comment: Quit 30 years ago  . Alcohol Use: No  . Drug Use: No  . Sexual Activity: No   Other Topics Concern  . Not on file   Social History Narrative   Currently in the nursing home until end of the month then will go to live with sister.           Family History  Problem Relation Age of Onset  . Hypertension Father   . Pneumonia Brother   . Lung disease Brother     Allergies as of 03/13/2014 - Review Complete 03/13/2014  Allergen Reaction Noted  . Allopurinol Other (See Comments) 02/27/2010    Current Outpatient  Prescriptions on File Prior to Visit  Medication Sig Dispense Refill  . amLODipine (NORVASC) 5 MG tablet Take 5 mg by mouth daily.      Marland Kitchen aspirin EC 81 MG tablet Take 81 mg by mouth daily.      Marland Kitchen atorvastatin (LIPITOR) 40 MG tablet Take 1 tablet (40 mg total) by mouth daily at 6 PM.  30 tablet  6  . calcium acetate (PHOSLO) 667 MG capsule Take 1,334 mg by mouth as needed (with snacks).      . calcium acetate (PHOSLO) 667 MG tablet Take 2,001 mg by mouth 3 (three) times daily.       . carvedilol (COREG) 6.25 MG tablet Take 1 tablet (6.25 mg total) by mouth 2 (two) times daily with a meal.  60 tablet  6  . cinacalcet (SENSIPAR) 60 MG tablet Take 60 mg by mouth daily.      . clopidogrel (PLAVIX) 75 MG tablet Take 1 tablet (75 mg total) by mouth daily with breakfast.  30 tablet  10  . COLCRYS 0.6 MG tablet Take 0.6 mg by mouth daily.       Marland Kitchen LIDOCAINE EX Apply 1 application topically See admin instructions. Apply to inject site on dialysis days (Tues, Thur, Sat) if needed for pain      . loperamide (IMODIUM A-D) 2 MG tablet Take 2 mg by mouth daily as needed. For diarrhea      . multivitamin (RENA-VIT) TABS tablet Take 1 tablet by mouth daily.  30 tablet  0  . nitroGLYCERIN (NITRODUR - DOSED IN MG/24 HR) 0.3 mg/hr patch Place 1 patch (0.3 mg total) onto the skin daily.  30 patch  12  . oxyCODONE-acetaminophen (PERCOCET/ROXICET) 5-325 MG per tablet Take 1 tablet by mouth every 6 (six) hours as needed. For pain  30 tablet  0  . pantoprazole (PROTONIX) 40 MG tablet Take 1 tablet (40 mg total) by mouth daily.  30 tablet  6  . predniSONE (DELTASONE) 5 MG tablet Take 5 mg by mouth daily.       Current Facility-Administered Medications on File Prior to Visit  Medication Dose Route Frequency Provider Last Rate Last Dose  . fentaNYL (SUBLIMAZE) injection   Intravenous PRN Carylon Perches, MD   25 mcg at 05/05/12 1544  . midazolam (VERSED) 5 MG/5ML injection   Intravenous PRN Carylon Perches, MD   2 mg at  05/05/12 1544     REVIEW OF SYSTEMS: Please see history of present illness, otherwise all systems negative  PHYSICAL EXAMINATION:   Vital signs  are BP 143/69  Pulse 72  Ht 5\' 11"  (1.803 m)  Wt 105 lb 11.2 oz (47.945 kg)  BMI 14.75 kg/m2  SpO2 100% General: The patient appears their stated age. HEENT:  No gross abnormalities Pulmonary:  Non labored breathing Musculoskeletal: There are no major deformities. Neurologic: No focal weakness or paresthesias are detected, Skin: 2 areas of dry eschar on the right foot.  The left great toe remains marginal.  There does appear to be a dry eschar at the tip.  No evidence of infection. Psychiatric: The patient has normal affect. Cardiovascular: There is a regular rate and rhythm without significant murmur appreciated.  By-triphasic signal within the right posterior tibial and left anterior tibial artery   Diagnostic Studies None  Assessment: Peripheral vascular disease with bilateral ulceration, status post percutaneous intervention Plan: By hand-held Doppler today, there were excellent signals at the foot.  There has been an improvement in the wounds.  I suspect the right foot will go on to heal without difficulty.  My only concern is the left great toe.  There is no evidence of infection.  The tip and DIP is essentially dry eschar and dry gangrene.  I have elected to observe this and see if it will are to amputate.  I'm concerned that he may not have adequate perfusion to heal a great toe amputation.  Since this wound appears to be contracting in getting better I will continue to observe it.  I have scheduled him to come back in 2 months for a wound check as well as an ultrasound.  He will contact me if he develops any new symptoms such as pain or redness within the left great toe.  I did give him 40 Percocet 5 mg today  V. Leia Alf, M.D. [facility] Office: [phone] Pager:  [phone]

## 2014-04-13 ENCOUNTER — Telehealth: Payer: Self-pay | Admitting: Cardiovascular Disease

## 2014-04-13 MED ORDER — AMLODIPINE BESYLATE 5 MG PO TABS: 5.0000 mg | ORAL_TABLET | Freq: Every day | ORAL | Status: DC

## 2014-04-13 NOTE — Telephone Encounter (Signed)
Patient needs a refill on his Amlodipine 5 mg # 30 1 daily----[facility] on [address].  Please notify patient.

## 2014-04-13 NOTE — Telephone Encounter (Signed)
Rx was sent to pharmacy electronically. Patient notified.  

## 2014-04-24 IMAGING — CT CT ABD-PELV W/O CM
2 of 4 series · 16 of 46 positions shown, 18 images · non-contrast
Comparison: 11/01/2004

CLINICAL DATA: Abdominal pain

CT ABDOMEN AND PELVIS WITHOUT CONTRAST
TECHNIQUE: Multidetector CT imaging of the abdomen and pelvis was
performed following the standard protocol without intravenous
contrast.

[Series 3: abd/pelv w/o 5.0 b31f st · axial · non-contrast · 0.67mm/px · z∈[-576,-140]mm · 13 of 95 slices shown, 15 images]
[im 4/95  soft-tissue]
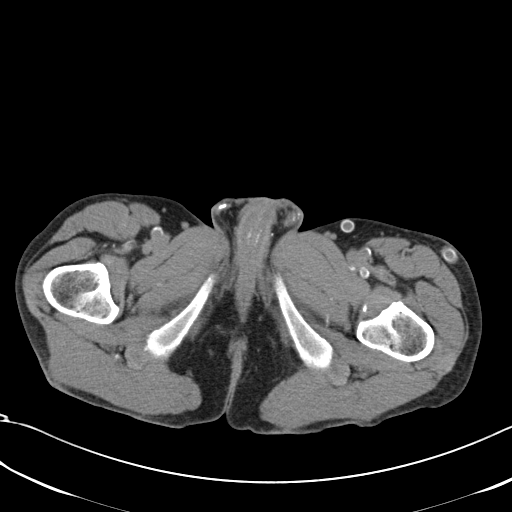
[im 4/95  bone]
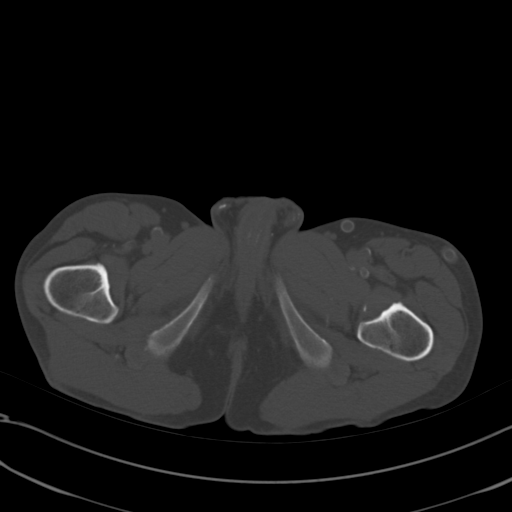
[im 12/95  soft-tissue]
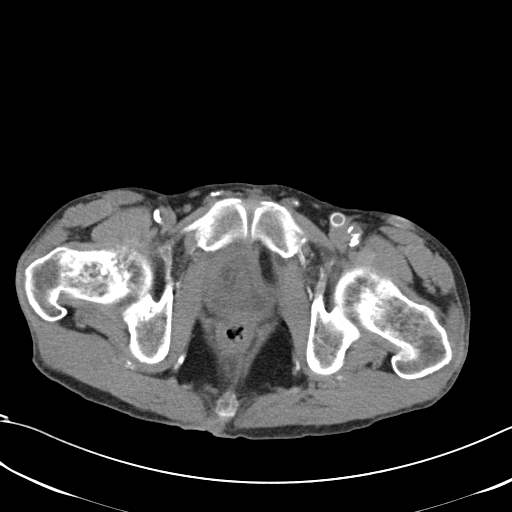
[im 19/95  soft-tissue]
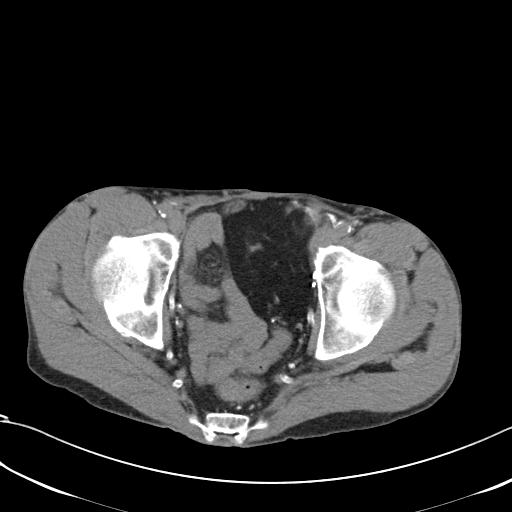
[im 27/95  soft-tissue]
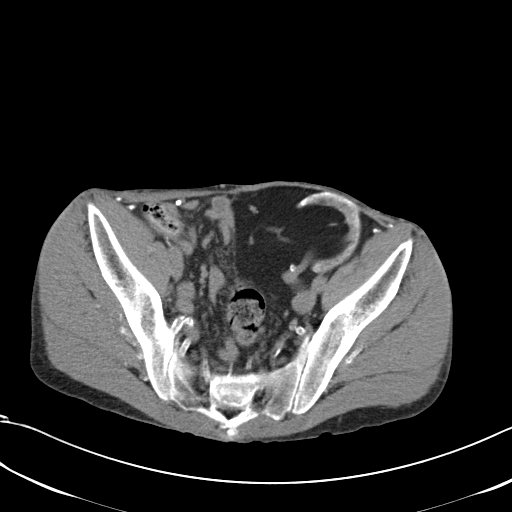
[im 34/95  soft-tissue]
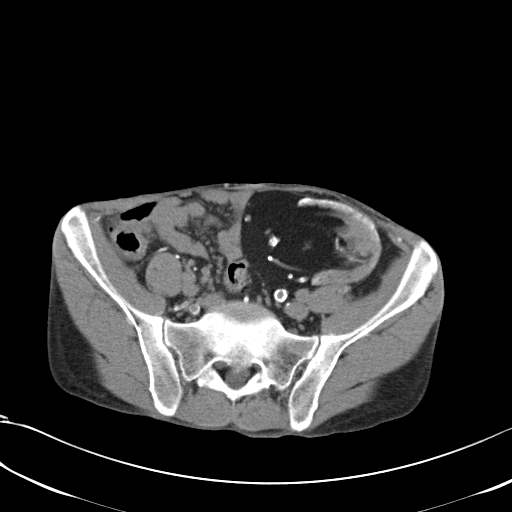
[im 42/95  soft-tissue]
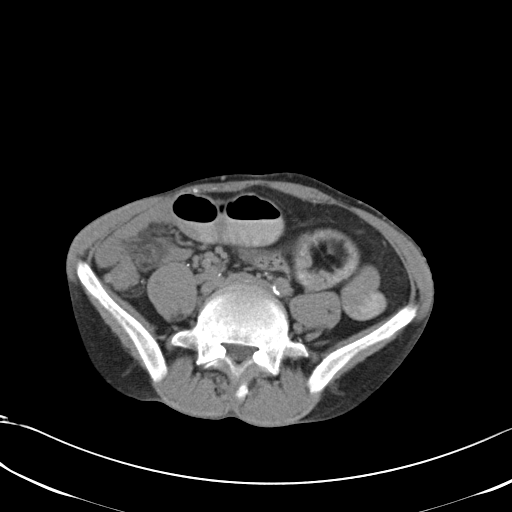
[im 49/95  soft-tissue]
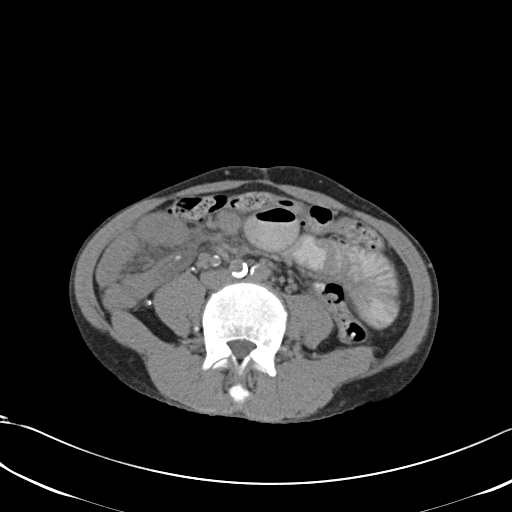
[im 53/95  soft-tissue]
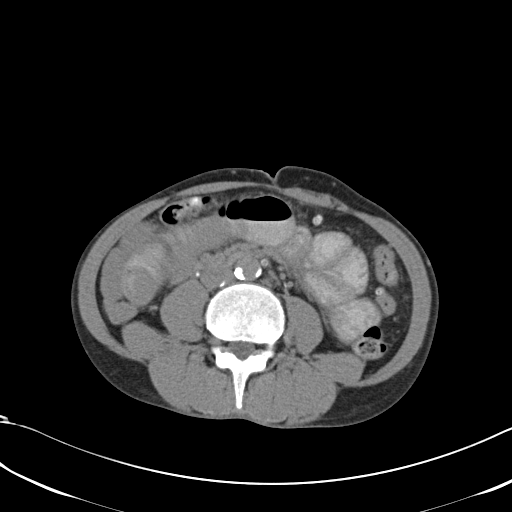
[im 61/95  soft-tissue]
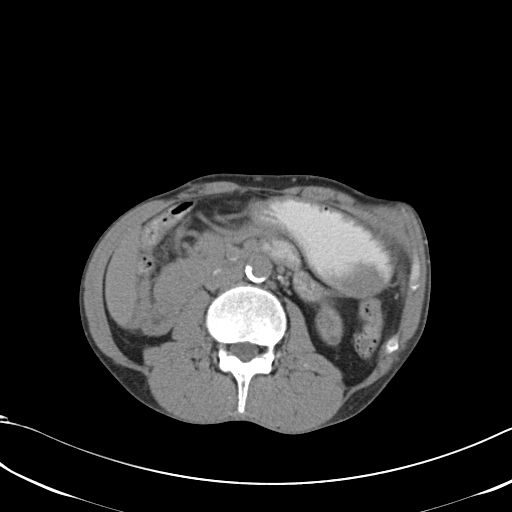
[im 61/95  bone]
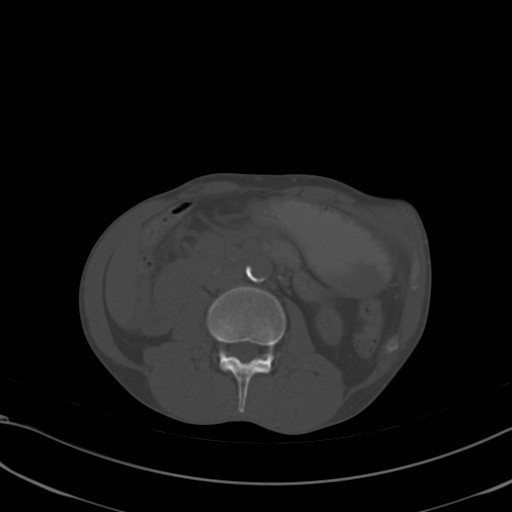
[im 68/95  soft-tissue]
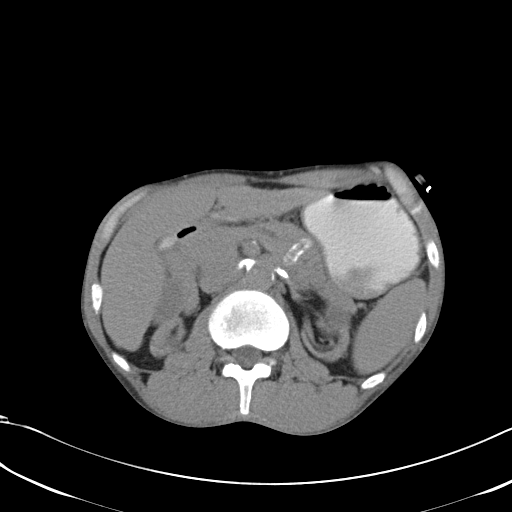
[im 76/95  soft-tissue]
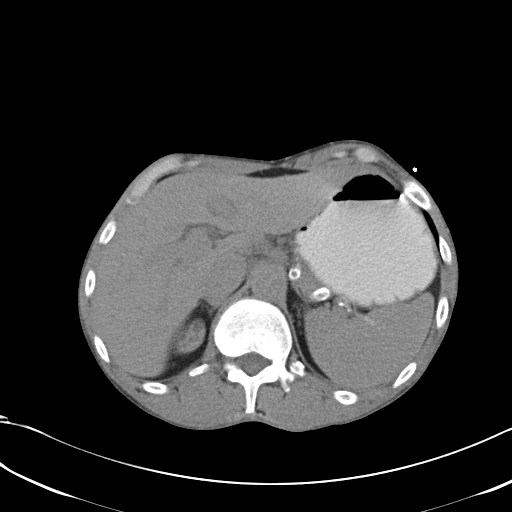
[im 83/95  soft-tissue]
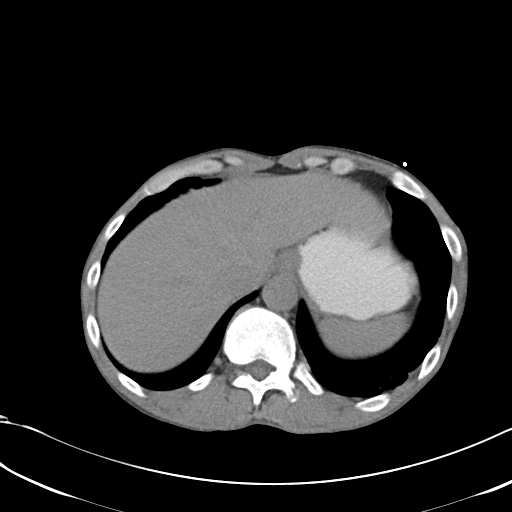
[im 91/95  soft-tissue]
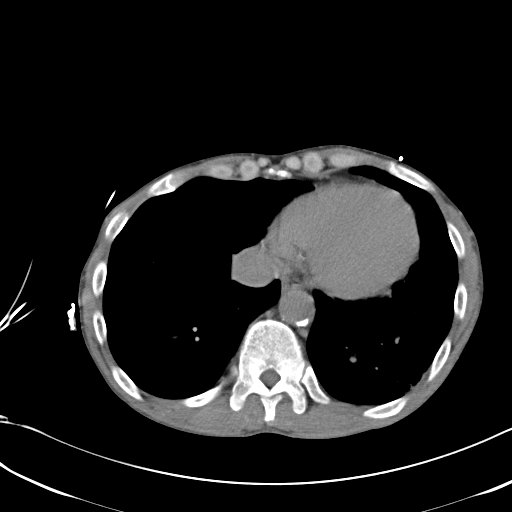

[Series 602: cor · coronal · 0.93mm/px · 3 of 82 slices shown]
[im 28/82  soft-tissue]
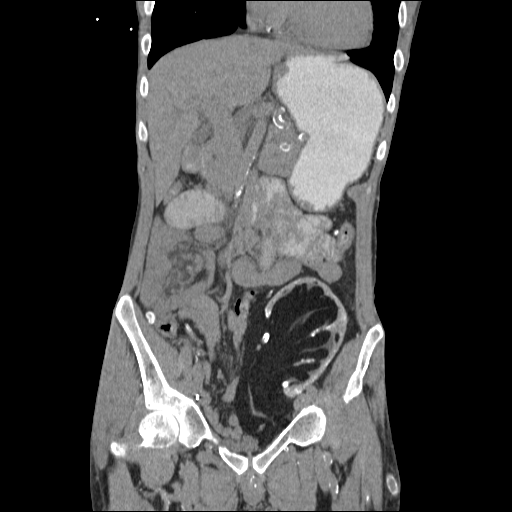
[im 37/82  soft-tissue]
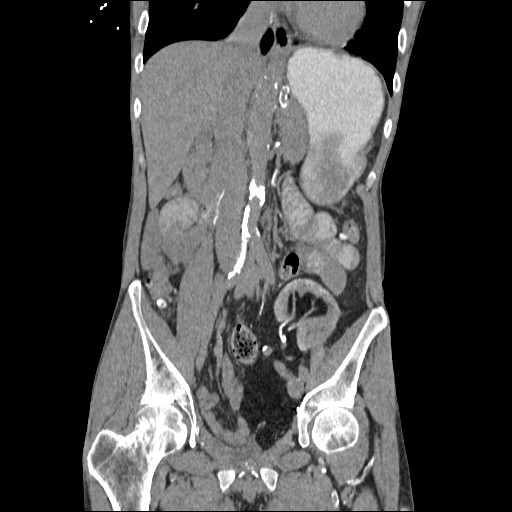
[im 46/82  soft-tissue]
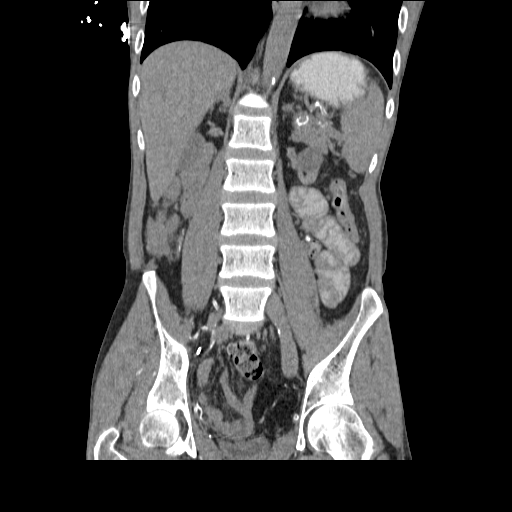

[16 of 46 positions shown; findings below may reference images not displayed]

FINDINGS: Coronary artery calcification.  Linear opacity at the
left lung base.

Organ abnormality/lesion detection is limited in the absence of
intravenous contrast. Within this limitation, unremarkable liver,
biliary system, spleen, adrenal glands. There is dilatation of the
main pancreatic duct, measuring up to 5 mm.

Atrophic native kidneys with bilateral cystic lesions of varying
complexity.  Transplant kidney left lower quadrant with sinus
lipomatosis and cortical thinning.

There is a loop of small bowel distended up to 3.3 cm and
demonstrating air fluid level.  Distal small bowel is decompressed.

Colonic diverticulosis.  No CT evidence for diverticulitis

No free intraperitoneal air or fluid.  No lymphadenopathy.

Advanced atherosclerotic disease of the aorta and branch vessels.

Decompressed bladder.  Surgical clips in the pelvis.  Partially
imaged left femoral bypass.

The bony changes are suggestive renal osteodystrophy. Unchanged
cystic and sclerotic changes of the right femoral neck.  No acute
osseous finding.
IMPRESSION: Dilated small bowel loops up to 3.3 cm with air-fluid levels.
Decompressed bowel loops distally.  This may reflect an early small
bowel obstruction.  Recommend correlation with symptoms and if no
intervention, follow-up KUB to document passage of contrast into
the colon.

Advanced atherosclerotic disease.

Transplant kidney left lower quadrant with prominent renal sinus
fat, similar to prior.  Bilateral native kidneys with incompletely
characterized cystic lesions of varying complexity.  Consider
follow-up renal MRI.

Dilated main pancreatic duct up to 5 mm.  No obstructing lesion
visualized by CT.  This can also be better characterized at the
time of MR.

## 2014-04-25 ENCOUNTER — Telehealth: Payer: Self-pay

## 2014-04-25 NOTE — Telephone Encounter (Signed)
Phone call from pt.  Asking for recommendation to keep the left great toe and 2nd toe from the skin rubbing against itself.  Reported that when he takes his socks and shoes off, "the skin is moist and peeling between the 2 toes, and doesn't look good."  Explained that after washing it and exposing it to air, it looks a lot better.  Denies active drainage.  Stated it "feels sticky" between the toes.  Denies odor. Describes seeing "white" drainage when the skin comes off.  Denies fever/ chills.  Advised to wash and dry daily, and place 2 x 2 inch gauze square between the toes to wick moisture, and to prevent friction between the toes.  Encouraged to change the gauze prn, to maintain clean, dry area.  Has f/u appt 8/17.  Encouraged to call office if symptoms worsen.  Verb. Understanding.

## 2014-05-05 ENCOUNTER — Encounter: Payer: Self-pay | Admitting: Surgery

## 2014-05-08 ENCOUNTER — Encounter: Payer: Self-pay | Admitting: Surgery

## 2014-05-08 ENCOUNTER — Ambulatory Visit (INDEPENDENT_AMBULATORY_CARE_PROVIDER_SITE_OTHER)
Admission: RE | Admit: 2014-05-08 | Discharge: 2014-05-08 | Disposition: A | Discharge status: Home / Self Care | Payer: Medicare Other | Source: Ambulatory Visit | Attending: Surgery | Admitting: Surgery

## 2014-05-08 ENCOUNTER — Ambulatory Visit (INDEPENDENT_AMBULATORY_CARE_PROVIDER_SITE_OTHER): Payer: Medicare Other | Admitting: Surgery

## 2014-05-08 ENCOUNTER — Ambulatory Visit (HOSPITAL_COMMUNITY)
Admission: RE | Admit: 2014-05-08 | Discharge: 2014-05-08 | Disposition: A | Discharge status: Home / Self Care | Payer: Medicare Other | Source: Ambulatory Visit | Attending: Surgery | Admitting: Surgery

## 2014-05-08 VITALS — BP 148/50 | HR 65 | Temp 97.6°F | Ht 71.0 in | Wt 104.6 lb

## 2014-05-08 DIAGNOSIS — I739 Peripheral vascular disease, unspecified: Secondary | ICD-10-CM

## 2014-05-08 DIAGNOSIS — L98499 Non-pressure chronic ulcer of skin of other sites with unspecified severity: Principal | ICD-10-CM | POA: Insufficient documentation

## 2014-05-08 DIAGNOSIS — I70219 Atherosclerosis of native arteries of extremities with intermittent claudication, unspecified extremity: Secondary | ICD-10-CM

## 2014-05-08 NOTE — Progress Notes (Signed)
Patient name: [name] MRN: [id number] DOB: Mar 26, 1961 Sex: male     Chief Complaint  Patient presents with  . Re-evaluation    2 month f/u     HISTORY OF PRESENT ILLNESS: The patient is back today for followup. He initially presented with bilateral lower extremity ulcers on both great toes. On 01/03/2014, he underwent atherectomy and angioplasty of the left popliteal artery. On 01/11/2014, he underwent atherectomy with drug coded balloon angioplasty of the right popliteal artery as well as angioplasty of the right dorsalis pedis artery. After each procedure the patient developed chest pain. He ultimately underwent coronary stenting. I have been following him for bilateral wound on his feet.  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  He was able to heal areas at the base of his bilateral great toes.  He states that his pain has improved.   Past Medical History  Diagnosis Date  . ESRD on hemodialysis     ESRD with renal transplant in 1987 (cadaveric) after short period of dialysis.  Transplant failed in 2004 and he went back on hemodialysis. As of April 2015 getting HD via L thigh AVG on a TTS schedule at [facility] on [facility]   Cause of ESRD was HTN.    Marland Kitchen Hypertension   . Hx of kidney transplant     1987 > 2004  . Gout tophi   . Chronic steroid use     Has severe gout. Did not tolerate Allopurinol. Do not taper per PCP   . Systolic CHF, chronic     2 D echo 04/2012 with EF of 45 %   . Malnutrition   . 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 F/U with Dr Donne Hazel. Started on TNA  . Anemia associated with chronic renal failure   . Hepatitis C   . Heart murmur   . GERD (gastroesophageal reflux disease)   . H/O hiatal hernia   . Arthritis   . DVT (deep venous thrombosis)   . Peripheral vascular disease   . Prostate cancer       Past Surgical History  Procedure Laterality Date  . Laparotomy  04/14/2012    Procedure: EXPLORATORY LAPAROTOMY;  Surgeon: Joyice Faster. Cornett, MD;  Location: [place];  Service: General;  Laterality: N/A;  . Colon resection  04/14/2012  . Laparotomy  04/22/2012    Procedure: EXPLORATORY LAPAROTOMY;  Surgeon: Adin Hector, MD;  Location: [place];  Service: General;  Laterality: N/A;  lysis of adhesions, omentoplasty, repair small bowel  . Thrombectomy w/ embolectomy  04/27/2012    Procedure: THROMBECTOMY ARTERIOVENOUS GORE-TEX GRAFT;  Surgeon: Angelia Mould, MD;  Location: [place];  Service: Vascular;  Laterality: Left;  Thrombectomy of left thigh arteriovenous gortex graft  . Prostectomy  2011  . Renal grafts    . Pelvic abcess drainage Right 6/14    removal drain s/p bowl resection 13  . Transanal excision of rectal mass N/A 03/30/2013    Procedure: EXCISION OF anal MASS;  Surgeon: Joyice Faster. Cornett, MD;  Location: [place];  Service: General;  Laterality: N/A;  Exam under anesthesia with excision anal verge mass    History   Social History  . Marital Status: Single    Spouse Name: N/A    Number of Children: N/A  .  Years of Education: N/A   Occupational History  . Not on file.   Social History Main Topics  . Smoking status: Former Smoker -- 1.00 packs/day for 10 years    Types: Cigarettes    Quit date: 03/23/1973  . Smokeless tobacco: Never Used     Comment: Quit 30 years ago  . Alcohol Use: No  . Drug Use: No  . Sexual Activity: No   Other Topics Concern  . Not on file   Social History Narrative   Currently in the nursing home until end of the month then will go to live with sister.           Family History  Problem Relation Age of Onset  . Hypertension Father   . Pneumonia Brother   . Lung disease Brother     Allergies as of 05/08/2014 - Review Complete 05/08/2014  Allergen Reaction Noted  . Allopurinol Other (See Comments) 02/27/2010    Current Outpatient  Prescriptions on File Prior to Visit  Medication Sig Dispense Refill  . amLODipine (NORVASC) 5 MG tablet Take 1 tablet (5 mg total) by mouth daily.  30 tablet  11  . aspirin EC 81 MG tablet Take 81 mg by mouth daily.      Marland Kitchen atorvastatin (LIPITOR) 40 MG tablet Take 1 tablet (40 mg total) by mouth daily at 6 PM.  30 tablet  6  . calcium acetate (PHOSLO) 667 MG capsule Take 1,334 mg by mouth as needed (with snacks).      . calcium acetate (PHOSLO) 667 MG tablet Take 2,001 mg by mouth 3 (three) times daily.       . carvedilol (COREG) 6.25 MG tablet Take 1 tablet (6.25 mg total) by mouth 2 (two) times daily with a meal.  60 tablet  6  . cinacalcet (SENSIPAR) 60 MG tablet Take 60 mg by mouth daily.      . clopidogrel (PLAVIX) 75 MG tablet Take 1 tablet (75 mg total) by mouth daily with breakfast.  30 tablet  10  . COLCRYS 0.6 MG tablet Take 0.6 mg by mouth daily.       Marland Kitchen LIDOCAINE EX Apply 1 application topically See admin instructions. Apply to inject site on dialysis days (Tues, Thur, Sat) if needed for pain      . loperamide (IMODIUM A-D) 2 MG tablet Take 2 mg by mouth daily as needed. For diarrhea      . multivitamin (RENA-VIT) TABS tablet Take 1 tablet by mouth daily.  30 tablet  0  . nitroGLYCERIN (NITRODUR - DOSED IN MG/24 HR) 0.3 mg/hr patch Place 1 patch (0.3 mg total) onto the skin daily.  30 patch  12  . oxyCODONE-acetaminophen (PERCOCET/ROXICET) 5-325 MG per tablet Take 1 tablet by mouth every 6 (six) hours as needed. For pain  30 tablet  0  . pantoprazole (PROTONIX) 40 MG tablet Take 1 tablet (40 mg total) by mouth daily.  30 tablet  6  . predniSONE (DELTASONE) 5 MG tablet Take 5 mg by mouth daily.       Current Facility-Administered Medications on File Prior to Visit  Medication Dose Route Frequency Provider Last Rate Last Dose  . fentaNYL (SUBLIMAZE) injection   Intravenous PRN Carylon Perches, MD   25 mcg at 05/05/12 1544  . midazolam (VERSED) 5 MG/5ML injection   Intravenous PRN Carylon Perches, MD   2 mg at 05/05/12 1544     REVIEW OF SYSTEMS: No significant interval  change.  Please see my most recent clinic note.  PHYSICAL EXAMINATION:   Vital signs are BP 148/50  Pulse 65  Temp(Src) 97.6 F (36.4 C) (Oral)  Ht 5\' 11"  (1.803 m)  Wt 104 lb 9.6 oz (47.446 kg)  BMI 14.60 kg/m2  SpO2 100% General: The patient appears their stated age. HEENT:  No gross abnormalities Pulmonary:  Non labored breathing Abdomen: Soft and non-tender Musculoskeletal: There are no major deformities. Neurologic: No focal weakness or paresthesias are detected, Skin: Dry eschar to the dorsum of the right second toe without infection.  Dry gangrene to the left great toe there is a small area of drainage.  No erythema identified. Psychiatric: The patient has normal affect. Cardiovascular: There is a regular rate and rhythm without significant murmur appreciated.   Diagnostic Studies Duplex ultrasound was reviewed by myself today.  There are elevated velocity ratios within the right popliteal and left popliteal arteries which correlates to the treated section.  However, the peak velocity is 190.  Tibial waveforms are monophasic.  Assessment: Peripheral vascular disease with bilateral ulcers, status post bilateral percutaneous intervention Plan: The patient's wounds are slightly improved today.  His pain is definitely improved.  Ultrasound shows areas to monitor the adult think there is any reason to repeat his angiogram today.  We will continue with local wound care and observation.  He'll contact me if there are any changes.  Otherwise I'll see him back in 3 months with a repeat ultrasound.  If the wounds have not healed in 3 months and are any concerning findings on ultrasound, he would probably benefit from repeat angiography.  He does have a dialysis graft in the left leg.  I don't think there is reason to remove this at this time.  Eldridge Abrahams, M.D. [facility] [place] Office: [phone] Pager:  [phone]

## 2014-05-10 NOTE — Addendum Note (Signed)
Addended by: Mena Goes on: 05/10/2014 09:58 AM   Modules accepted: Orders

## 2014-06-07 ENCOUNTER — Ambulatory Visit (INDEPENDENT_AMBULATORY_CARE_PROVIDER_SITE_OTHER): Payer: Medicare Other | Admitting: Cardiovascular Disease

## 2014-06-07 VITALS — BP 133/56 | HR 74 | Ht 71.0 in | Wt 106.1 lb

## 2014-06-07 DIAGNOSIS — I70219 Atherosclerosis of native arteries of extremities with intermittent claudication, unspecified extremity: Secondary | ICD-10-CM

## 2014-06-07 DIAGNOSIS — I739 Peripheral vascular disease, unspecified: Secondary | ICD-10-CM

## 2014-06-07 DIAGNOSIS — N186 End stage renal disease: Secondary | ICD-10-CM

## 2014-06-07 DIAGNOSIS — I2510 Atherosclerotic heart disease of native coronary artery without angina pectoris: Secondary | ICD-10-CM

## 2014-06-07 DIAGNOSIS — Z992 Dependence on renal dialysis: Secondary | ICD-10-CM

## 2014-06-07 DIAGNOSIS — I25119 Atherosclerotic heart disease of native coronary artery with unspecified angina pectoris: Secondary | ICD-10-CM

## 2014-06-07 DIAGNOSIS — I209 Angina pectoris, unspecified: Secondary | ICD-10-CM

## 2014-06-07 NOTE — Patient Instructions (Signed)
No change with medication   Your physician wants you to follow-up in 6 month Dr Claiborne Billings.  You will receive a reminder letter in the mail two months in advance. If you don't receive a letter, please call our office to schedule the follow-up appointment.

## 2014-06-08 ENCOUNTER — Encounter: Payer: Self-pay | Admitting: Cardiovascular Disease

## 2014-06-08 NOTE — Progress Notes (Signed)
Patient ID: [name], male   DOB: 10-01-1960, 53 y.o.   MRN: [id number]     HPI: [name] is a 53 y.o. male who presents to the office today for a 5 month follow up cardiology evaluation following his April 2015 hospitalization for PVD and ulna to hypertensive urgency and chest pain.  [name] has severe PVD, ESRD on HD w/ failed transplant, prostate CA with surgery and complications requiring additional surgeries in 2013, Hepatitis C, malnutrition, and severe PVD with dry gangrene. He was hospitalized by Dr Trula Slade 04/14-04/16 when he was admitted for PV angiogram and had PTA with atherectomy to the left popliteal artery. He was seen by cardiology that admission for chest pain, an echocardiogram was performed with normal LV function.  He was re-admited to the hospital for PV intervention on his right popliteal 01/11/14. He had atherectomy x 2 to the right popliteal, balloon angioplasty x 2 to the right popliteal and angioplasty to the right dorsalis pedis (anterior tibial artery) by Dr Trula Slade. Post-procedure, he developed chest pain, in the setting of SBP > 240, ECG is abnormal and cardiology was asked to evaluate. The pt was treated for accelerated HTN and unstable angina. He had diagnostic cath on 01/12/14 showing RCA disease. He underwent elective rotablator atherectomy and DES x 3 proximal and mid RCA on 01/13/14 by Dr Julianne Handler.  He returns for followup evaluation with Kerin Ransom, [facility] in June 2015.  At that time, he denied any recurrent chest pain symptomatology.  His blood pressure was under better control.  He was taking both Toprol and carvedilol and his Toprol was discontinued.  He undergoes hemodialysis on Tuesday, Thursday, and Saturdays.  He continues to have dry gangrene of his left great toe tip.  He denies palpitations.  Denies PND or orthopnea.  He presents today for evaluation.   Past Medical History  Diagnosis Date  . ESRD on hemodialysis     ESRD with renal transplant  in 1987 (cadaveric) after short period of dialysis.  Transplant failed in 2004 and he went back on hemodialysis. As of April 2015 getting HD via L thigh AVG on a TTS schedule at [facility] on [facility].   Cause of ESRD was HTN.    Marland Kitchen Hypertension   . Hx of kidney transplant     1987 > 2004  . Gout tophi   . Chronic steroid use     Has severe gout. Did not tolerate Allopurinol. Do not taper per PCP   . Systolic CHF, chronic     2 D echo 04/2012 with EF of 45 %   . Malnutrition   . 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 F/U with Dr Donne Hazel. Started on TNA  . Anemia associated with chronic renal failure   . Hepatitis C   . Heart murmur   . GERD (gastroesophageal reflux disease)   . H/O hiatal hernia   . Arthritis   . DVT (deep venous thrombosis)   . Peripheral vascular disease   . Prostate cancer     Past Surgical History  Procedure Laterality Date  . Laparotomy  04/14/2012    Procedure: EXPLORATORY LAPAROTOMY;  Surgeon: Joyice Faster. Cornett, MD;  Location: [place];  Service: General;  Laterality: N/A;  . Colon resection  04/14/2012  . Laparotomy  04/22/2012    Procedure: EXPLORATORY LAPAROTOMY;  Surgeon: Adin Hector, MD;  Location: [facility];  Service: General;  Laterality: N/A;  lysis of adhesions, omentoplasty, repair small bowel  . Thrombectomy w/ embolectomy  04/27/2012    Procedure: THROMBECTOMY ARTERIOVENOUS GORE-TEX GRAFT;  Surgeon: Angelia Mould, MD;  Location: [place];  Service: Vascular;  Laterality: Left;  Thrombectomy of left thigh arteriovenous gortex graft  . Prostectomy  2011  . Renal grafts    . Pelvic abcess drainage Right 6/14    removal drain s/p bowl resection 13  . Transanal excision of rectal mass N/A 03/30/2013    Procedure: EXCISION OF anal MASS;  Surgeon: Joyice Faster. Cornett, MD;  Location: [place];  Service: General;  Laterality: N/A;  Exam under anesthesia with  excision anal verge mass    Allergies  Allergen Reactions  . Allopurinol Other (See Comments)    REACTION: decreased platelets    Current Outpatient Prescriptions  Medication Sig Dispense Refill  . aspirin EC 81 MG tablet Take 81 mg by mouth daily.      Marland Kitchen atorvastatin (LIPITOR) 40 MG tablet Take 1 tablet (40 mg total) by mouth daily at 6 PM.  30 tablet  6  . calcium acetate (PHOSLO) 667 MG tablet Take 2,001 mg by mouth 3 (three) times daily.       . carvedilol (COREG) 6.25 MG tablet Take 1 tablet (6.25 mg total) by mouth 2 (two) times daily with a meal.  60 tablet  6  . cinacalcet (SENSIPAR) 60 MG tablet Take 60 mg by mouth daily.      . clopidogrel (PLAVIX) 75 MG tablet Take 1 tablet (75 mg total) by mouth daily with breakfast.  30 tablet  10  . COLCRYS 0.6 MG tablet Take 0.6 mg by mouth daily.       Marland Kitchen LIDOCAINE EX Apply 1 application topically See admin instructions. Apply to inject site on dialysis days (Tues, Thur, Sat) if needed for pain      . loperamide (IMODIUM A-D) 2 MG tablet Take 2 mg by mouth daily as needed. For diarrhea      . multivitamin (RENA-VIT) TABS tablet Take 1 tablet by mouth daily.  30 tablet  0  . nitroGLYCERIN (NITRODUR - DOSED IN MG/24 HR) 0.3 mg/hr patch Place 1 patch (0.3 mg total) onto the skin daily.  30 patch  12  . oxyCODONE-acetaminophen (PERCOCET/ROXICET) 5-325 MG per tablet Take 1 tablet by mouth every 6 (six) hours as needed. For pain  30 tablet  0  . pantoprazole (PROTONIX) 40 MG tablet Take 1 tablet (40 mg total) by mouth daily.  30 tablet  6  . predniSONE (DELTASONE) 5 MG tablet Take 5 mg by mouth daily.       No current facility-administered medications for this visit.   Facility-Administered Medications Ordered in Other Visits  Medication Dose Route Frequency Provider Last Rate Last Dose  . fentaNYL (SUBLIMAZE) injection   Intravenous PRN Carylon Perches, MD   25 mcg at 05/05/12 1544  . midazolam (VERSED) 5 MG/5ML injection   Intravenous PRN  Carylon Perches, MD   2 mg at 05/05/12 1544    History   Social History  . Marital Status: Single    Spouse Name: N/A    Number of Children: N/A  . Years of Education: N/A   Occupational History  . Not on file.   Social History Main Topics  . Smoking status: Former Smoker -- 1.00 packs/day for 10 years    Types: Cigarettes    Quit  date: 03/23/1973  . Smokeless tobacco: Never Used     Comment: Quit 30 years ago  . Alcohol Use: No  . Drug Use: No  . Sexual Activity: No   Other Topics Concern  . Not on file   Social History Narrative   Currently in the nursing home until end of the month then will go to live with sister.           Family History  Problem Relation Age of Onset  . Hypertension Father   . Pneumonia Brother   . Lung disease Brother     ROS General: Negative; No fevers, chills, or night sweats HEENT: Negative; No changes in vision or hearing, sinus congestion, difficulty swallowing Pulmonary: Negative; No cough, wheezing, shortness of breath, hemoptysis Cardiovascular: See HPI: No chest pain, presyncope, syncope, palpatations GI: Positive for GERD;No nausea, vomiting, diarrhea, or abdominal pain GU: Negative; No dysuria, hematuria, or difficulty voiding Musculoskeletal: Negative; no myalgias, joint pain, or weakness Hematologic: Negative; no easy bruising, bleeding Endocrine: Negative; no heat/cold intolerance; no diabetes, Neuro: Negative; no changes in balance, headaches Skin: Positive for dry gangrene of his left great toe Psychiatric: Negative; No behavioral problems, depression Sleep: Negative; No snoring,  daytime sleepiness, hypersomnolence, bruxism, restless legs, hypnogognic hallucinations. Other comprehensive 14 point system review is negative   Physical Exam BP 133/56  Pulse 74  Ht 5\' 11"  (1.803 m)  Wt 106 lb 1.6 oz (48.127 kg)  BMI 14.80 kg/m2 General: Alert, oriented, no distress.  Skin: normal turgor, no rashes, warm and  dry HEENT: Normocephalic, atraumatic. Pupils equal round and reactive to light; sclera anicteric; extraocular muscles intact, No lid lag; Nose without nasal septal hypertrophy; Mouth/Parynx benign; Mallinpatti scale 3 Neck: No JVD, no carotid bruits; normal carotid upstroke Lungs: clear to ausculatation and percussion bilaterally; no wheezing or rales, normal inspiratory and expiratory effort Chest wall: without tenderness to palpitation Heart: PMI not displaced, RRR, s1 s2 normal, 1/6systolic murmur, No diastolic murmur, no rubs, gallops, thrills, or heaves Abdomen: soft, nontender; no hepatosplenomehaly, BS+; abdominal aorta nontender and not dilated by palpation. Back: no CVA tenderness Pulses: Left groin fistula, where he undergoes dialysis Musculoskeletal: full range of motion, normal strength, no joint deformities Extremities: Dry gangrene of his left great toe distally, no clubbing or edema, Homan's sign negative  Neurologic: grossly nonfocal; Cranial nerves grossly wnl Psychologic: Normal mood and affect   ECG (independently read by me): Normal sinus rhythm at 74 beats per minute.  PR interval 120 ms; QTc interval 457 ms.  No ectopy.  No significant ST changes.  LABS:  BMET    Component Value Date/Time   NA 142 01/16/2014 1437   K 3.8 01/16/2014 1437   CL 101 01/16/2014 1437   CO2 29 01/16/2014 1437   GLUCOSE 92 01/16/2014 1437   BUN 46* 01/16/2014 1437   CREATININE 7.80* 01/16/2014 1437   CREATININE 7.11* 01/14/2014 0600   CALCIUM 8.2* 01/16/2014 1437   GFRNONAA 8* 01/14/2014 0600   GFRAA 9* 01/14/2014 0600     Hepatic Function Panel     Component Value Date/Time   PROT 7.3 03/23/2013 1136   ALBUMIN 2.6* 01/12/2014 1345   AST 34 03/23/2013 1136   ALT 30 03/23/2013 1136   ALKPHOS 117 03/23/2013 1136   BILITOT 0.8 03/23/2013 1136   BILIDIR 1.1* 05/27/2012 0649   IBILI 0.5 05/27/2012 0649     CBC    Component Value Date/Time   WBC 9.5 01/16/2014 1437   RBC  3.52* 01/16/2014 1437   HGB  9.8* 01/16/2014 1437   HCT 30.7* 01/16/2014 1437   PLT 166 01/16/2014 1437   MCV 87.2 01/16/2014 1437   MCH 27.8 01/16/2014 1437   MCHC 32.1 01/16/2014 1437   RDW 14.4 01/16/2014 1437   LYMPHSABS 1.1 03/23/2013 1136   MONOABS 0.8 03/23/2013 1136   EOSABS 0.1 03/23/2013 1136   BASOSABS 0.0 03/23/2013 1136     BNP No results found for this basename: probnp    Lipid Panel     Component Value Date/Time   CHOL 121 01/04/2014 1105   TRIG 137 01/04/2014 1105   HDL 68 01/04/2014 1105   CHOLHDL 1.8 01/04/2014 1105   VLDL 27 01/04/2014 1105   LDLCALC 26 01/04/2014 1105     RADIOLOGY: No results found.    ASSESSMENT AND PLAN: Mr. Kempker is a 53 year old African American male who developed hypertensive urgency after undergoing atherectomy and intervention to his right popliteal artery in April 2015.  Presently, his blood pressure is controlled on carvedilol 6.25 mg twice a day.  He continues to be on aspirin 81 mg and Plavix 75 mg for dual antiplatelet therapy.  He does have dry gangrene of his left great toe distally, which appears to be stable.  He does have GERD for which she takes Protonix and this is stable.  An echo Doppler study in April 2015 showed an ejection fraction of 38-10% grade 2 diastolic dysfunction.  He had mildly thickened aortic valve without stenosis.  Aggressive lipid intervention will be necessary with a target LDL of less than 70.  In this patient with coronary as well as peripheral vascular disease.  He is not having any recurrent anginal symptomatology and is now 5 months since undergoing rotational atherectomy of his proximal and mid RCA.  He undergoes dialysis on Tuesdays, Thursdays, and Saturdays, and is followed by Dr. Jimmy Footman for his end-stage renal disease.  I will see him in 6 months for follow-up cardiologic evaluation.     Troy Sine, MD, [facility]  06/08/2014 1:27 PM

## 2014-07-07 ENCOUNTER — Ambulatory Visit (INDEPENDENT_AMBULATORY_CARE_PROVIDER_SITE_OTHER)
Admission: RE | Admit: 2014-07-07 | Discharge: 2014-07-07 | Disposition: A | Discharge status: Home / Self Care | Payer: Medicare Other | Source: Ambulatory Visit | Attending: Surgery | Admitting: Surgery

## 2014-07-07 ENCOUNTER — Ambulatory Visit (HOSPITAL_COMMUNITY)
Admission: RE | Admit: 2014-07-07 | Discharge: 2014-07-07 | Disposition: A | Discharge status: Home / Self Care | Payer: Medicare Other | Source: Ambulatory Visit | Attending: Vascular Surgery | Admitting: Vascular Surgery

## 2014-07-07 ENCOUNTER — Encounter: Payer: Self-pay | Admitting: Surgery

## 2014-07-07 DIAGNOSIS — I739 Peripheral vascular disease, unspecified: Secondary | ICD-10-CM | POA: Diagnosis present

## 2014-07-07 DIAGNOSIS — L98499 Non-pressure chronic ulcer of skin of other sites with unspecified severity: Secondary | ICD-10-CM

## 2014-07-10 ENCOUNTER — Encounter: Payer: Self-pay | Admitting: Surgery

## 2014-07-10 ENCOUNTER — Ambulatory Visit (INDEPENDENT_AMBULATORY_CARE_PROVIDER_SITE_OTHER): Payer: Medicare Other | Admitting: Surgery

## 2014-07-10 ENCOUNTER — Other Ambulatory Visit: Payer: Self-pay

## 2014-07-10 VITALS — BP 160/48 | HR 74 | Temp 97.6°F | Resp 16 | Ht 71.0 in | Wt 108.5 lb

## 2014-07-10 DIAGNOSIS — I96 Gangrene, not elsewhere classified: Secondary | ICD-10-CM

## 2014-07-10 DIAGNOSIS — I70219 Atherosclerosis of native arteries of extremities with intermittent claudication, unspecified extremity: Secondary | ICD-10-CM

## 2014-07-10 DIAGNOSIS — I739 Peripheral vascular disease, unspecified: Secondary | ICD-10-CM

## 2014-07-10 MED ORDER — OXYCODONE HCL 5 MG PO TABS: 5.0000 mg | ORAL_TABLET | Freq: Three times a day (TID) | ORAL | Status: DC | PRN

## 2014-07-10 NOTE — Progress Notes (Signed)
Patient name: [name] MRN: [id number] DOB: 1960/10/16 Sex: male     Chief Complaint  Patient presents with  . PVD    Left great toe gangrene,  ( has left thigh AVG, HD on  Tuesday, Thursday, and Saturday at  [facility])    HISTORY OF PRESENT ILLNESS: The patient is back today for followup. He initially presented with bilateral lower extremity ulcers on both great toes. On 01/03/2014, he underwent atherectomy and angioplasty of the left popliteal artery. On 01/11/2014, he underwent atherectomy with drug coded balloon angioplasty of the right popliteal artery as well as angioplasty of the right dorsalis pedis artery. After each procedure the patient developed chest pain. He ultimately underwent coronary stenting. I have been following him for bilateral wound on his feet. 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 He was able to heal areas at the base of his bilateral great toes.   He still has dry gangrene to the left great toe.  All other ulcers have healed.  He'll occasionally gets some pain in the left great toe.  Past Medical History  Diagnosis Date  . ESRD on hemodialysis     ESRD with renal transplant in 1987 (cadaveric) after short period of dialysis.  Transplant failed in 2004 and he went back on hemodialysis. As of April 2015 getting HD via L thigh AVG on a TTS schedule at [facility] [place] [facility].   Cause of ESRD was HTN.    Marland Kitchen Hypertension   . Hx of kidney transplant     1987 > 2004  . Gout tophi   . Chronic steroid use     Has severe gout. Did not tolerate Allopurinol. Do not taper per PCP   . Systolic CHF, chronic     2 D echo 04/2012 with EF of 45 %   . Malnutrition   . 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 F/U with Dr Donne Hazel. Started on TNA  . Anemia associated with chronic renal failure   . Hepatitis C   .  Heart murmur   . GERD (gastroesophageal reflux disease)   . H/O hiatal hernia   . Arthritis   . DVT (deep venous thrombosis)   . Peripheral vascular disease   . Prostate cancer     Past Surgical History  Procedure Laterality Date  . Laparotomy  04/14/2012    Procedure: EXPLORATORY LAPAROTOMY;  Surgeon: Joyice Faster. Cornett, MD;  Location: [place];  Service: General;  Laterality: N/A;  . Colon resection  04/14/2012  . Laparotomy  04/22/2012    Procedure: EXPLORATORY LAPAROTOMY;  Surgeon: Adin Hector, MD;  Location: [place];  Service: General;  Laterality: N/A;  lysis of adhesions, omentoplasty, repair small bowel  . Thrombectomy w/ embolectomy  04/27/2012    Procedure: THROMBECTOMY ARTERIOVENOUS GORE-TEX GRAFT;  Surgeon: Angelia Mould, MD;  Location: [place];  Service: Vascular;  Laterality: Left;  Thrombectomy of left thigh arteriovenous gortex graft  . Prostectomy  2011  . Renal grafts    . Pelvic abcess drainage Right 6/14    removal drain s/p bowl resection 13  . Transanal excision of rectal mass N/A 03/30/2013    Procedure: EXCISION OF anal MASS;  Surgeon: Joyice Faster. Cornett, MD;  Location: [place];  Service: General;  Laterality: N/A;  Exam under anesthesia with excision anal verge mass    History   Social History  . Marital Status: Single    Spouse Name: N/A    Number of Children: N/A  . Years of Education: N/A   Occupational History  . Not on file.   Social History Main Topics  . Smoking status: Former Smoker -- 1.00 packs/day for 10 years    Types: Cigarettes    Quit date: 03/23/1973  . Smokeless tobacco: Never Used     Comment: Quit 30 years ago  . Alcohol Use: No  . Drug Use: No  . Sexual Activity: No   Other Topics Concern  . Not on file   Social History Narrative   Currently in the nursing home until end of the month then will go to live with sister.           Family History  Problem Relation Age of Onset  . Hypertension Father   . Pneumonia Brother   .  Lung disease Brother     Allergies as of 07/10/2014 - Review Complete 07/10/2014  Allergen Reaction Noted  . Allopurinol Other (See Comments) 02/27/2010    Current Outpatient Prescriptions on File Prior to Visit  Medication Sig Dispense Refill  . aspirin EC 81 MG tablet Take 81 mg by mouth daily.      Marland Kitchen atorvastatin (LIPITOR) 40 MG tablet Take 1 tablet (40 mg total) by mouth daily at 6 PM.  30 tablet  6  . calcium acetate (PHOSLO) 667 MG tablet Take 2,001 mg by mouth 3 (three) times daily.       . carvedilol (COREG) 6.25 MG tablet Take 1 tablet (6.25 mg total) by mouth 2 (two) times daily with a meal.  60 tablet  6  . cinacalcet (SENSIPAR) 60 MG tablet Take 60 mg by mouth daily.      . clopidogrel (PLAVIX) 75 MG tablet Take 1 tablet (75 mg total) by mouth daily with breakfast.  30 tablet  10  . COLCRYS 0.6 MG tablet Take 0.6 mg by mouth daily.       Marland Kitchen LIDOCAINE EX Apply 1 application topically See admin instructions. Apply to inject site on dialysis days (Tues, Thur, Sat) if needed for pain      . loperamide (IMODIUM A-D) 2 MG tablet Take 2 mg by mouth daily as needed. For diarrhea      . multivitamin (RENA-VIT) TABS tablet Take 1 tablet by mouth daily.  30 tablet  0  . nitroGLYCERIN (NITRODUR - DOSED IN MG/24 HR) 0.3 mg/hr patch Place 1 patch (0.3 mg total) onto the skin daily.  30 patch  12  . oxyCODONE-acetaminophen (PERCOCET/ROXICET) 5-325 MG per tablet Take 1 tablet by mouth every 6 (six) hours as needed. For pain  30 tablet  0  . pantoprazole (PROTONIX) 40 MG tablet Take 1 tablet (40 mg total) by mouth daily.  30 tablet  6  . predniSONE (DELTASONE) 5 MG tablet Take 5 mg by mouth daily.       Current Facility-Administered Medications on File Prior to Visit  Medication Dose Route Frequency Provider Last Rate Last Dose  . fentaNYL (SUBLIMAZE) injection   Intravenous PRN Carylon Perches, MD   25 mcg at 05/05/12 1544  . midazolam (VERSED) 5 MG/5ML injection   Intravenous PRN Carylon Perches, MD   2 mg at 05/05/12 1544     REVIEW OF SYSTEMS: No changes from prior visit  PHYSICAL EXAMINATION:  Vital signs are BP 160/48  Pulse 74  Temp(Src) 97.6 F (36.4 C) (Oral)  Resp 16  Ht 5\' 11"  (1.803 m)  Wt 108 lb 8 oz (49.215 kg)  BMI 15.14 kg/m2  SpO2 98% General: The patient appears their stated age. HEENT:  No gross abnormalities Pulmonary:  Non labored breathing Abdomen: Soft and non-tender Musculoskeletal: There are no major deformities. Neurologic: No focal weakness or paresthesias are detected, Skin: Left great toe gangrene, dry  Psychiatric: The patient has normal affect. Cardiovascular: There is a regular rate and rhythm without significant murmur appreciated.   Diagnostic Studies Duplex ultrasound was ordered and reviewed today.  The patient has significant calcification which prohibits ankle-brachial index measurement.  There is an area block elevation in the left superficial femoral artery with a velocity ratio 4.7.  Similarly there is a velocity ratio 3.9 in the right popliteal artery.  His most likely correlate with the previously treated areas.  Assessment: History of bilateral ulcers and peripheral vascular disease, atherosclerosis Plan: The patient continues to have dry gangrene of the left great toe without evidence of infection.  I have not recommended amputation but rather auto amputation.  Because of the velocity elevation in the left popliteal artery by ultrasound and the fact that he still has not really healed his wound in the left, I recommended proceeding with angiography.  There would be cannulation of the right femoral artery, an aortogram with bilateral runoff to evaluate the stenoses in both groins and present intervention in the left superficial femoral/popliteal area.  The patient still has a dialysis graft in the left leg which I am reluctant to ligate given how stable his vascular disease has been a left his procedure is been scheduled for  Wednesday, October 28  V. Leia Alf, M.D. [facility] Office: [phone] Pager:  [phone]

## 2014-07-11 ENCOUNTER — Other Ambulatory Visit: Payer: Self-pay

## 2014-07-18 MED ORDER — SODIUM CHLORIDE 0.9 % IJ SOLN: 3.0000 mL | INTRAMUSCULAR | Status: DC | PRN

## 2014-07-19 ENCOUNTER — Other Ambulatory Visit: Payer: Self-pay

## 2014-07-19 ENCOUNTER — Inpatient Hospital Stay (HOSPITAL_COMMUNITY)
Admission: RE | Admit: 2014-07-19 | Discharge: 2014-07-20 | DRG: 252 | Disposition: A | Discharge status: Home / Self Care | Payer: Medicare Other | Source: Ambulatory Visit | Attending: Surgery | Admitting: Surgery

## 2014-07-19 ENCOUNTER — Encounter (HOSPITAL_COMMUNITY)
Admission: RE | Disposition: A | Discharge status: Home / Self Care | Payer: Self-pay | Source: Ambulatory Visit | Attending: Surgery

## 2014-07-19 ENCOUNTER — Encounter (HOSPITAL_COMMUNITY): Payer: Self-pay | Admitting: Nurse Practitioner

## 2014-07-19 DIAGNOSIS — I5042 Chronic combined systolic (congestive) and diastolic (congestive) heart failure: Secondary | ICD-10-CM | POA: Diagnosis present

## 2014-07-19 DIAGNOSIS — Z7952 Long term (current) use of systemic steroids: Secondary | ICD-10-CM

## 2014-07-19 DIAGNOSIS — Z87891 Personal history of nicotine dependence: Secondary | ICD-10-CM

## 2014-07-19 DIAGNOSIS — N186 End stage renal disease: Secondary | ICD-10-CM | POA: Diagnosis present

## 2014-07-19 DIAGNOSIS — I70219 Atherosclerosis of native arteries of extremities with intermittent claudication, unspecified extremity: Secondary | ICD-10-CM

## 2014-07-19 DIAGNOSIS — K219 Gastro-esophageal reflux disease without esophagitis: Secondary | ICD-10-CM | POA: Diagnosis present

## 2014-07-19 DIAGNOSIS — R778 Other specified abnormalities of plasma proteins: Secondary | ICD-10-CM

## 2014-07-19 DIAGNOSIS — I209 Angina pectoris, unspecified: Secondary | ICD-10-CM

## 2014-07-19 DIAGNOSIS — I10 Essential (primary) hypertension: Secondary | ICD-10-CM

## 2014-07-19 DIAGNOSIS — I2510 Atherosclerotic heart disease of native coronary artery without angina pectoris: Secondary | ICD-10-CM | POA: Diagnosis present

## 2014-07-19 DIAGNOSIS — Z86718 Personal history of other venous thrombosis and embolism: Secondary | ICD-10-CM | POA: Diagnosis not present

## 2014-07-19 DIAGNOSIS — Z940 Kidney transplant status: Secondary | ICD-10-CM | POA: Diagnosis not present

## 2014-07-19 DIAGNOSIS — I7025 Atherosclerosis of native arteries of other extremities with ulceration: Secondary | ICD-10-CM

## 2014-07-19 DIAGNOSIS — I208 Other forms of angina pectoris: Secondary | ICD-10-CM

## 2014-07-19 DIAGNOSIS — N2581 Secondary hyperparathyroidism of renal origin: Secondary | ICD-10-CM | POA: Diagnosis present

## 2014-07-19 DIAGNOSIS — I120 Hypertensive chronic kidney disease with stage 5 chronic kidney disease or end stage renal disease: Secondary | ICD-10-CM | POA: Diagnosis present

## 2014-07-19 DIAGNOSIS — I739 Peripheral vascular disease, unspecified: Secondary | ICD-10-CM

## 2014-07-19 DIAGNOSIS — Z8546 Personal history of malignant neoplasm of prostate: Secondary | ICD-10-CM

## 2014-07-19 DIAGNOSIS — L97529 Non-pressure chronic ulcer of other part of left foot with unspecified severity: Secondary | ICD-10-CM | POA: Diagnosis present

## 2014-07-19 DIAGNOSIS — B1920 Unspecified viral hepatitis C without hepatic coma: Secondary | ICD-10-CM | POA: Diagnosis present

## 2014-07-19 DIAGNOSIS — I70262 Atherosclerosis of native arteries of extremities with gangrene, left leg: Secondary | ICD-10-CM | POA: Diagnosis present

## 2014-07-19 DIAGNOSIS — Z992 Dependence on renal dialysis: Secondary | ICD-10-CM | POA: Diagnosis not present

## 2014-07-19 DIAGNOSIS — M1A9XX1 Chronic gout, unspecified, with tophus (tophi): Secondary | ICD-10-CM | POA: Diagnosis present

## 2014-07-19 DIAGNOSIS — D631 Anemia in chronic kidney disease: Secondary | ICD-10-CM | POA: Diagnosis present

## 2014-07-19 DIAGNOSIS — I998 Other disorder of circulatory system: Secondary | ICD-10-CM

## 2014-07-19 DIAGNOSIS — R7989 Other specified abnormal findings of blood chemistry: Secondary | ICD-10-CM

## 2014-07-19 DIAGNOSIS — I70229 Atherosclerosis of native arteries of extremities with rest pain, unspecified extremity: Secondary | ICD-10-CM

## 2014-07-19 DIAGNOSIS — I70245 Atherosclerosis of native arteries of left leg with ulceration of other part of foot: Secondary | ICD-10-CM

## 2014-07-19 DIAGNOSIS — I2583 Coronary atherosclerosis due to lipid rich plaque: Secondary | ICD-10-CM

## 2014-07-19 HISTORY — DX: Chronic diastolic (congestive) heart failure: I50.32

## 2014-07-19 HISTORY — DX: Personal history of other venous thrombosis and embolism: Z86.718

## 2014-07-19 HISTORY — DX: Gangrene, not elsewhere classified: I96

## 2014-07-19 HISTORY — DX: Atherosclerotic heart disease of native coronary artery without angina pectoris: I25.10

## 2014-07-19 HISTORY — PX: ABDOMINAL AORTAGRAM: SHX5454

## 2014-07-19 LAB — CREATININE, SERUM
CREATININE: 9.46 mg/dL — AB (ref 0.50–1.35)
GFR calc Af Amer: 6 mL/min — ABNORMAL LOW (ref >90)
GFR calc non Af Amer: 5 mL/min — ABNORMAL LOW (ref >90)

## 2014-07-19 LAB — CBC
HCT: 38.8 % — ABNORMAL LOW (ref 39.0–52.0)
Hemoglobin: 12.6 g/dL — ABNORMAL LOW (ref 13.0–17.0)
MCH: 28.1 pg (ref 26.0–34.0)
MCHC: 32.5 g/dL (ref 30.0–36.0)
MCV: 86.4 fL (ref 78.0–100.0)
PLATELETS: 213 10*3/uL (ref 150–400)
RBC: 4.49 MIL/uL (ref 4.22–5.81)
RDW: 14.8 % (ref 11.5–15.5)
WBC: 7.3 10*3/uL (ref 4.0–10.5)

## 2014-07-19 LAB — POCT ACTIVATED CLOTTING TIME
ACTIVATED CLOTTING TIME: 186 s
ACTIVATED CLOTTING TIME: 174 s

## 2014-07-19 LAB — CK TOTAL AND CKMB (NOT AT ARMC)
CK TOTAL: 34 U/L (ref 7–232)
CK, MB: 1.9 ng/mL (ref 0.3–4.0)
Relative Index: INVALID (ref 0.0–2.5)

## 2014-07-19 LAB — POCT I-STAT, CHEM 8
BUN: 46 mg/dL — ABNORMAL HIGH (ref 6–23)
CALCIUM ION: 0.87 mmol/L — AB (ref 1.12–1.23)
CHLORIDE: 100 meq/L (ref 96–112)
CREATININE: 8.3 mg/dL — AB (ref 0.50–1.35)
Glucose, Bld: 86 mg/dL (ref 70–99)
HCT: 42 % (ref 39.0–52.0)
Hemoglobin: 14.3 g/dL (ref 13.0–17.0)
Potassium: 4.7 mEq/L (ref 3.7–5.3)
Sodium: 136 mEq/L — ABNORMAL LOW (ref 137–147)
TCO2: 25 mmol/L (ref 0–100)

## 2014-07-19 LAB — TROPONIN I: Troponin I: 0.65 ng/mL (ref <0.30)

## 2014-07-19 LAB — MRSA PCR SCREENING: MRSA BY PCR: NEGATIVE

## 2014-07-19 SURGERY — ABDOMINAL AORTAGRAM
Anesthesia: LOCAL

## 2014-07-19 MED ORDER — HEPARIN (PORCINE) IN NACL 2-0.9 UNIT/ML-% IJ SOLN
INTRAMUSCULAR | Status: AC
  Filled 2014-07-19: qty 1000

## 2014-07-19 MED ORDER — CALCITRIOL 0.5 MCG PO CAPS
2.5000 ug | ORAL_CAPSULE | ORAL | Status: DC
  Filled 2014-07-19: qty 5

## 2014-07-19 MED ORDER — OXYCODONE HCL 5 MG PO TABS
5.0000 mg | ORAL_TABLET | ORAL | Status: DC | PRN
  Administered 2014-07-20: 5 mg via ORAL
  Filled 2014-07-19: qty 2
  Filled 2014-07-19: qty 1

## 2014-07-19 MED ORDER — SODIUM CHLORIDE 0.9 % IV SOLN: 100.0000 mL | INTRAVENOUS | Status: DC | PRN

## 2014-07-19 MED ORDER — ALUM & MAG HYDROXIDE-SIMETH 200-200-20 MG/5ML PO SUSP: 15.0000 mL | ORAL | Status: DC | PRN

## 2014-07-19 MED ORDER — MIDAZOLAM HCL 2 MG/2ML IJ SOLN
INTRAMUSCULAR | Status: AC
Start: 2014-07-19 — End: 2014-07-19
  Filled 2014-07-19: qty 2

## 2014-07-19 MED ORDER — PREDNISONE 5 MG PO TABS
5.0000 mg | ORAL_TABLET | Freq: Every day | ORAL | Status: DC
  Administered 2014-07-19 – 2014-07-20 (×2): 5 mg via ORAL
  Filled 2014-07-19 (×2): qty 1

## 2014-07-19 MED ORDER — ACETAMINOPHEN 650 MG RE SUPP: 325.0000 mg | RECTAL | Status: DC | PRN

## 2014-07-19 MED ORDER — HYDRALAZINE HCL 20 MG/ML IJ SOLN
10.0000 mg | INTRAMUSCULAR | Status: DC | PRN
  Administered 2014-07-19: 10 mg via INTRAVENOUS

## 2014-07-19 MED ORDER — LIDOCAINE-PRILOCAINE 2.5-2.5 % EX CREA
1.0000 "application " | TOPICAL_CREAM | CUTANEOUS | Status: DC | PRN
  Filled 2014-07-19: qty 5

## 2014-07-19 MED ORDER — ONDANSETRON HCL 4 MG/2ML IJ SOLN: 4.0000 mg | Freq: Four times a day (QID) | INTRAMUSCULAR | Status: DC | PRN

## 2014-07-19 MED ORDER — OXYCODONE HCL 5 MG PO TABS: 5.0000 mg | ORAL_TABLET | Freq: Four times a day (QID) | ORAL | Status: DC | PRN

## 2014-07-19 MED ORDER — CINACALCET HCL 30 MG PO TABS
60.0000 mg | ORAL_TABLET | Freq: Every day | ORAL | Status: DC
  Filled 2014-07-19 (×2): qty 2

## 2014-07-19 MED ORDER — LABETALOL HCL 5 MG/ML IV SOLN
INTRAVENOUS | Status: AC
  Filled 2014-07-19: qty 4

## 2014-07-19 MED ORDER — NEPRO/CARBSTEADY PO LIQD
237.0000 mL | ORAL | Status: DC | PRN
Start: 2014-07-19 — End: 2014-07-20
  Filled 2014-07-19: qty 237

## 2014-07-19 MED ORDER — CARVEDILOL 6.25 MG PO TABS
6.2500 mg | ORAL_TABLET | Freq: Two times a day (BID) | ORAL | Status: DC
Start: 2014-07-19 — End: 2014-07-20
  Administered 2014-07-19 – 2014-07-20 (×2): 6.25 mg via ORAL
  Filled 2014-07-19 (×5): qty 1

## 2014-07-19 MED ORDER — HEPARIN SODIUM (PORCINE) 1000 UNIT/ML DIALYSIS: 20.0000 [IU]/kg | INTRAMUSCULAR | Status: DC | PRN

## 2014-07-19 MED ORDER — MORPHINE SULFATE 10 MG/ML IJ SOLN
2.0000 mg | INTRAMUSCULAR | Status: DC | PRN
  Administered 2014-07-19: 2 mg via INTRAVENOUS

## 2014-07-19 MED ORDER — ASPIRIN EC 81 MG PO TBEC
81.0000 mg | DELAYED_RELEASE_TABLET | Freq: Every day | ORAL | Status: DC
  Administered 2014-07-19 – 2014-07-20 (×2): 81 mg via ORAL
  Filled 2014-07-19 (×2): qty 1

## 2014-07-19 MED ORDER — MORPHINE SULFATE 2 MG/ML IJ SOLN: 2.0000 mg | INTRAMUSCULAR | Status: DC | PRN

## 2014-07-19 MED ORDER — BISACODYL 10 MG RE SUPP
10.0000 mg | Freq: Every day | RECTAL | Status: DC | PRN
Start: 2014-07-19 — End: 2014-07-20

## 2014-07-19 MED ORDER — FENTANYL CITRATE 0.05 MG/ML IJ SOLN
INTRAMUSCULAR | Status: AC
  Filled 2014-07-19: qty 2

## 2014-07-19 MED ORDER — HEPARIN SODIUM (PORCINE) 1000 UNIT/ML IJ SOLN
INTRAMUSCULAR | Status: AC
  Filled 2014-07-19: qty 1

## 2014-07-19 MED ORDER — LIDOCAINE HCL (PF) 1 % IJ SOLN
INTRAMUSCULAR | Status: AC
  Filled 2014-07-19: qty 30

## 2014-07-19 MED ORDER — ENOXAPARIN SODIUM 30 MG/0.3ML ~~LOC~~ SOLN
30.0000 mg | SUBCUTANEOUS | Status: DC
  Filled 2014-07-19: qty 0.3

## 2014-07-19 MED ORDER — GUAIFENESIN-DM 100-10 MG/5ML PO SYRP
15.0000 mL | ORAL_SOLUTION | ORAL | Status: DC | PRN
  Filled 2014-07-19: qty 15

## 2014-07-19 MED ORDER — HYDRALAZINE HCL 20 MG/ML IJ SOLN
10.0000 mg | Freq: Once | INTRAMUSCULAR | Status: AC
  Administered 2014-07-19: 10 mg via INTRAVENOUS

## 2014-07-19 MED ORDER — HYDRALAZINE HCL 20 MG/ML IJ SOLN
INTRAMUSCULAR | Status: AC
  Filled 2014-07-19: qty 1

## 2014-07-19 MED ORDER — CARVEDILOL 6.25 MG PO TABS: 6.2500 mg | ORAL_TABLET | Freq: Two times a day (BID) | ORAL | Status: DC

## 2014-07-19 MED ORDER — COLCHICINE 0.6 MG PO TABS
0.6000 mg | ORAL_TABLET | Freq: Every day | ORAL | Status: DC
  Filled 2014-07-19: qty 1

## 2014-07-19 MED ORDER — ATORVASTATIN CALCIUM 40 MG PO TABS
40.0000 mg | ORAL_TABLET | Freq: Every day | ORAL | Status: DC
  Filled 2014-07-19: qty 1

## 2014-07-19 MED ORDER — LABETALOL HCL 5 MG/ML IV SOLN
10.0000 mg | INTRAVENOUS | Status: DC | PRN
  Administered 2014-07-19 (×3): 10 mg via INTRAVENOUS

## 2014-07-19 MED ORDER — SODIUM CHLORIDE 0.9 % IV SOLN
100.0000 mL | INTRAVENOUS | Status: DC | PRN
Start: 2014-07-19 — End: 2014-07-20

## 2014-07-19 MED ORDER — HYDRALAZINE HCL 20 MG/ML IJ SOLN
5.0000 mg | INTRAMUSCULAR | Status: AC | PRN
  Administered 2014-07-19 (×2): 5 mg via INTRAVENOUS

## 2014-07-19 MED ORDER — CLOPIDOGREL BISULFATE 75 MG PO TABS: 75.0000 mg | ORAL_TABLET | Freq: Every day | ORAL | Status: DC

## 2014-07-19 MED ORDER — PANTOPRAZOLE SODIUM 40 MG PO TBEC
40.0000 mg | DELAYED_RELEASE_TABLET | Freq: Every day | ORAL | Status: DC
  Filled 2014-07-19: qty 1

## 2014-07-19 MED ORDER — CALCIUM ACETATE 667 MG PO CAPS
2001.0000 mg | ORAL_CAPSULE | Freq: Three times a day (TID) | ORAL | Status: DC
  Administered 2014-07-20: 2001 mg via ORAL
  Filled 2014-07-19 (×4): qty 3

## 2014-07-19 MED ORDER — ACETAMINOPHEN 325 MG PO TABS: 325.0000 mg | ORAL_TABLET | ORAL | Status: DC | PRN

## 2014-07-19 MED ORDER — HEPARIN SODIUM (PORCINE) 1000 UNIT/ML DIALYSIS: 1000.0000 [IU] | INTRAMUSCULAR | Status: DC | PRN

## 2014-07-19 MED ORDER — MORPHINE SULFATE 2 MG/ML IJ SOLN
INTRAMUSCULAR | Status: AC
  Filled 2014-07-19: qty 1

## 2014-07-19 MED ORDER — LIDOCAINE HCL (PF) 1 % IJ SOLN: 5.0000 mL | INTRAMUSCULAR | Status: DC | PRN

## 2014-07-19 MED ORDER — NITROGLYCERIN 0.3 MG/HR TD PT24
0.3000 mg | MEDICATED_PATCH | Freq: Every day | TRANSDERMAL | Status: DC
  Administered 2014-07-19 – 2014-07-20 (×2): 0.3 mg via TRANSDERMAL
  Filled 2014-07-19 (×2): qty 1

## 2014-07-19 MED ORDER — RENA-VITE PO TABS
1.0000 | ORAL_TABLET | Freq: Every day | ORAL | Status: DC
  Administered 2014-07-19: 1 via ORAL
  Filled 2014-07-19 (×2): qty 1

## 2014-07-19 MED ORDER — DOCUSATE SODIUM 100 MG PO CAPS
100.0000 mg | ORAL_CAPSULE | Freq: Two times a day (BID) | ORAL | Status: DC
  Administered 2014-07-19: 100 mg via ORAL
  Filled 2014-07-19 (×3): qty 1

## 2014-07-19 MED ORDER — ALTEPLASE 2 MG IJ SOLR
2.0000 mg | Freq: Once | INTRAMUSCULAR | Status: AC | PRN
  Filled 2014-07-19: qty 2

## 2014-07-19 MED ORDER — METOPROLOL TARTRATE 1 MG/ML IV SOLN: 2.0000 mg | INTRAVENOUS | Status: DC | PRN

## 2014-07-19 MED ORDER — PENTAFLUOROPROP-TETRAFLUOROETH EX AERO: 1.0000 "application " | INHALATION_SPRAY | CUTANEOUS | Status: DC | PRN

## 2014-07-19 MED ORDER — CLOPIDOGREL BISULFATE 75 MG PO TABS
75.0000 mg | ORAL_TABLET | Freq: Every day | ORAL | Status: DC
  Administered 2014-07-20: 75 mg via ORAL
  Filled 2014-07-19: qty 1

## 2014-07-19 MED ORDER — PHENOL 1.4 % MT LIQD
1.0000 | OROMUCOSAL | Status: DC | PRN
  Filled 2014-07-19: qty 177

## 2014-07-19 SURGICAL SUPPLY — 55 items
ADH SKN CLS APL DERMABOND .7 (GAUZE/BANDAGES/DRESSINGS) ×3
BANDAGE ELASTIC 4 VELCRO ST LF (GAUZE/BANDAGES/DRESSINGS) IMPLANT
BANDAGE ESMARK 6X9 LF (GAUZE/BANDAGES/DRESSINGS) IMPLANT
BNDG CMPR 9X6 STRL LF SNTH (GAUZE/BANDAGES/DRESSINGS)
BNDG ESMARK 6X9 LF (GAUZE/BANDAGES/DRESSINGS)
CANISTER SUCTION 2500CC (MISCELLANEOUS) ×4 IMPLANT
CLIP TI MEDIUM 24 (CLIP) ×4 IMPLANT
CLIP TI WIDE RED SMALL 24 (CLIP) ×4 IMPLANT
COVER SURGICAL LIGHT HANDLE (MISCELLANEOUS) ×4 IMPLANT
CUFF TOURNIQUET SINGLE 24IN (TOURNIQUET CUFF) IMPLANT
CUFF TOURNIQUET SINGLE 34IN LL (TOURNIQUET CUFF) IMPLANT
CUFF TOURNIQUET SINGLE 44IN (TOURNIQUET CUFF) IMPLANT
DERMABOND ADVANCED (GAUZE/BANDAGES/DRESSINGS) ×1
DERMABOND ADVANCED .7 DNX12 (GAUZE/BANDAGES/DRESSINGS) ×3 IMPLANT
DRAIN CHANNEL 15F RND FF W/TCR (WOUND CARE) IMPLANT
DRAPE WARM FLUID 44X44 (DRAPE) ×4 IMPLANT
DRAPE X-RAY CASS 24X20 (DRAPES) IMPLANT
DRSG COVADERM 4X10 (GAUZE/BANDAGES/DRESSINGS) IMPLANT
DRSG COVADERM 4X8 (GAUZE/BANDAGES/DRESSINGS) IMPLANT
ELECT REM PT RETURN 9FT ADLT (ELECTROSURGICAL) ×4
ELECTRODE REM PT RTRN 9FT ADLT (ELECTROSURGICAL) ×3 IMPLANT
EVACUATOR SILICONE 100CC (DRAIN) IMPLANT
GLOVE BIOGEL PI IND STRL 7.5 (GLOVE) ×3 IMPLANT
GLOVE BIOGEL PI INDICATOR 7.5 (GLOVE) ×1
GLOVE SURG SS PI 7.5 STRL IVOR (GLOVE) ×4 IMPLANT
GOWN PREVENTION PLUS XXLARGE (GOWN DISPOSABLE) ×4 IMPLANT
GOWN STRL NON-REIN LRG LVL3 (GOWN DISPOSABLE) ×12 IMPLANT
HEMOSTAT SNOW SURGICEL 2X4 (HEMOSTASIS) IMPLANT
KIT BASIN OR (CUSTOM PROCEDURE TRAY) ×4 IMPLANT
KIT ROOM TURNOVER OR (KITS) ×4 IMPLANT
MARKER GRAFT CORONARY BYPASS (MISCELLANEOUS) IMPLANT
NS IRRIG 1000ML POUR BTL (IV SOLUTION) ×8 IMPLANT
PACK PERIPHERAL VASCULAR (CUSTOM PROCEDURE TRAY) ×4 IMPLANT
PAD ARMBOARD 7.5X6 YLW CONV (MISCELLANEOUS) ×8 IMPLANT
PADDING CAST COTTON 6X4 STRL (CAST SUPPLIES) IMPLANT
SET COLLECT BLD 21X3/4 12 (NEEDLE) IMPLANT
STOPCOCK 4 WAY LG BORE MALE ST (IV SETS) IMPLANT
SUT ETHILON 3 0 PS 1 (SUTURE) IMPLANT
SUT PROLENE 5 0 C 1 24 (SUTURE) ×4 IMPLANT
SUT PROLENE 6 0 BV (SUTURE) ×4 IMPLANT
SUT PROLENE 7 0 BV 1 (SUTURE) IMPLANT
SUT SILK 2 0 SH (SUTURE) ×4 IMPLANT
SUT SILK 3 0 (SUTURE)
SUT SILK 3-0 18XBRD TIE 12 (SUTURE) IMPLANT
SUT VIC AB 2-0 CT1 27 (SUTURE) ×8
SUT VIC AB 2-0 CT1 TAPERPNT 27 (SUTURE) ×6 IMPLANT
SUT VIC AB 3-0 SH 27 (SUTURE) ×8
SUT VIC AB 3-0 SH 27X BRD (SUTURE) ×6 IMPLANT
SUT VICRYL 4-0 PS2 18IN ABS (SUTURE) ×8 IMPLANT
TOWEL OR 17X24 6PK STRL BLUE (TOWEL DISPOSABLE) ×8 IMPLANT
TOWEL OR 17X26 10 PK STRL BLUE (TOWEL DISPOSABLE) ×8 IMPLANT
TRAY FOLEY CATH 16FRSI W/METER (SET/KITS/TRAYS/PACK) ×4 IMPLANT
TUBING EXTENTION W/L.L. (IV SETS) IMPLANT
UNDERPAD 30X30 INCONTINENT (UNDERPADS AND DIAPERS) ×4 IMPLANT
WATER STERILE IRR 1000ML POUR (IV SOLUTION) ×4 IMPLANT

## 2014-07-19 NOTE — Progress Notes (Signed)
CRITICAL VALUE ALERT  Critical value received:  Troponin 0.65  Date of notification:  07/19/14  Time of notification:  2030  Critical value read back:Yes.    Nurse who received alert:  [place], RN  MD notified (1st page):  Dr. Kennith Center  Time of first page:  2050  Responding MD:  Dr. Kennith Center  Time MD responded:  2100  New orders pending chart review by MD

## 2014-07-19 NOTE — Progress Notes (Addendum)
Dr Trula Slade called by Chip Ouida Sills RN and gave update.  Orders given for another dose of Labetalol 10 mg for elevated BP.  PA call for admissions orders.

## 2014-07-19 NOTE — Consult Note (Signed)
I saw the patient and agree with the above assessment and plan.    79M s/p arteriogram with PTA/Stent and had CP post procedure w/ known CAD.  Has ESRD TTS at [facility].  On eval today is without chest pain, no SOB, and appears well.  Cardiology is evaluating his CP episode.  Will plan on his HD on schedule tomorrow.

## 2014-07-19 NOTE — Progress Notes (Signed)
Relieved by Nelda Severe

## 2014-07-19 NOTE — Consult Note (Signed)
[facility] Renal Consultation Note    Indication for Consultation:  Management of ESRD/hemodialysis; anemia, hypertension/volume and secondary hyperparathyroidism PCP:  HPI: [name] is a 53 y.o. male with ESRD  (TTS [facility])with prior failed transplants, CAD, PPVD, hx SBO, CHF, prostate cancer who presented for arteriogram to evaluate dry gangrene of  left great toe gangrene. He had PTA and stent of recurrent left popliteal artery stenosis and also was noted to have recurrence of stenosis within the right popliteal artery. He subsequently had chest pain following the procedure and is being admitted for further evaluation and treatment. He dialyzes via a left thigh graft and is due for dialysis tomorrow. Usual pre HD BPs are 160 - 180/70 - 100 range with gains of 1.5 - 2.5 between treatments.  He got to 46.9 (EDW 47) yesterday.  He said the pain he had today was like what he's had before but not as bad. He had pressure but no diaphoresis or SOB. He's had no vision changes, HA. He's prone to constipation and takes prn stool softeners and maybe apple juice.  He's had ongoing problems with pain in his great toe which hurts sometimes more than at other times. He takes oxy prn for this.    Past Medical History  Diagnosis Date  . ESRD on hemodialysis     a. ESRD 2/2 HTN with renal transplant in 1987 (cadaveric) after short period of dialysis;  b. Transplant failed in 2004 and he went back on HD;  c. As of 10/15 getting HD via L thigh AVG on a TTS schedule at [facility] on [facility].  . Hypertension   . Hx of kidney transplant     a. 1987-> back on HD since 2004  . Gout tophi   . Chronic steroid use     a. Has severe gout. Did not tolerate Allopurinol. Do not taper per PCP   . Systolic CHF, chronic     2 D echo 04/2012 with EF of 45 %   . Malnutrition   . Hx SBO 04/2012    a. 04/2012 s/p ex lap w/ reexploration a week later due to anastomotic breakdown and now has an enterocutaneous  fistula. F/U with Dr Donne Hazel. Started on TNA  . Anemia associated with chronic renal failure   . Hepatitis C   . GERD (gastroesophageal reflux disease)   . H/O hiatal hernia   . Arthritis   . History of DVT (deep vein thrombosis)   . Peripheral vascular disease     a. 12/2013 PTA of L Pop;  b. 12/2013 PTA R pop, R DP;  c. 06/2014 L Pop CBA/DCB PTA.  . Prostate cancer   . CAD (coronary artery disease)     a. 12/2013 Cath/PCI: EF 45-50%, LM Ca2+, LAD 30-40p, 29m, D1/2/3 min irregs, LCX 50ost, 30-38m, RCA 70p, 95/64m (Rota->3.0x23 Xience distal, 3.0x23 Xience mid, 3.0x28 Xience prox).  . Chronic diastolic CHF (congestive heart failure)     a. 12/2013 Echo: EF 55-60%, mild LVH, nl wall motion, Gr 2 DD.  . Dry gangrene     a. L great toe   Past Surgical History  Procedure Laterality Date  . Laparotomy  04/14/2012    Procedure: EXPLORATORY LAPAROTOMY;  Surgeon: Joyice Faster. Cornett, MD;  Location: [place];  Service: General;  Laterality: N/A;  . Colon resection  04/14/2012  . Laparotomy  04/22/2012    Procedure: EXPLORATORY LAPAROTOMY;  Surgeon: Adin Hector, MD;  Location: [place];  Service: General;  Laterality: N/A;  lysis of adhesions, omentoplasty, repair small bowel  . Thrombectomy w/ embolectomy  04/27/2012    Procedure: THROMBECTOMY ARTERIOVENOUS GORE-TEX GRAFT;  Surgeon: Angelia Mould, MD;  Location: [place];  Service: Vascular;  Laterality: Left;  Thrombectomy of left thigh arteriovenous gortex graft  . Prostectomy  2011  . Renal grafts    . Pelvic abcess drainage Right 6/14    removal drain s/p bowl resection 13  . Transanal excision of rectal mass N/A 03/30/2013    Procedure: EXCISION OF anal MASS;  Surgeon: Joyice Faster. Cornett, MD;  Location: [place];  Service: General;  Laterality: N/A;  Exam under anesthesia with excision anal verge mass   Family History  Problem Relation Age of Onset  . Hypertension Father   . Pneumonia Brother   . Lung disease Brother    Social History:   reports that he quit smoking about 41 years ago. His smoking use included Cigarettes. He has a 10 pack-year smoking history. He has never used smokeless tobacco. He reports that he does not drink alcohol or use illicit drugs. Allergies  Allergen Reactions  . Allopurinol Other (See Comments)    REACTION: decreased platelets   Prior to Admission medications   Medication Sig Start Date End Date Taking? Authorizing Provider  aspirin EC 81 MG tablet Take 81 mg by mouth daily.   Yes Historical Provider, MD  atorvastatin (LIPITOR) 40 MG tablet Take 1 tablet (40 mg total) by mouth daily at 6 PM. 01/14/14  Yes Lendon Colonel, NP  calcium acetate (PHOSLO) 667 MG tablet Take 2,001 mg by mouth 3 (three) times daily.  01/24/13  Yes Historical Provider, MD  carvedilol (COREG) 6.25 MG tablet Take 1 tablet (6.25 mg total) by mouth 2 (two) times daily with a meal. 01/14/14  Yes Lendon Colonel, NP  cinacalcet (SENSIPAR) 60 MG tablet Take 60 mg by mouth daily.   Yes Historical Provider, MD  clopidogrel (PLAVIX) 75 MG tablet Take 1 tablet (75 mg total) by mouth daily with breakfast. 01/14/14  Yes Lendon Colonel, NP  COLCRYS 0.6 MG tablet Take 0.6 mg by mouth daily.  01/04/13  Yes Historical Provider, MD  LIDOCAINE EX Apply 1 application topically See admin instructions. Apply to inject site on dialysis days (Tues, Thur, Sat) if needed for pain   Yes Historical Provider, MD  loperamide (IMODIUM A-D) 2 MG tablet Take 2 mg by mouth daily as needed for diarrhea or loose stools.  08/13/12  Yes Erroll Luna, MD  multivitamin (RENA-VIT) TABS tablet Take 1 tablet by mouth daily. 07/05/12  Yes Rosalia Hammers, MD  nitroGLYCERIN (NITRODUR - DOSED IN MG/24 HR) 0.3 mg/hr patch Place 1 patch (0.3 mg total) onto the skin daily. 01/14/14  Yes Lendon Colonel, NP  oxyCODONE (OXY IR/ROXICODONE) 5 MG immediate release tablet Take 5 mg by mouth every 6 (six) hours as needed for severe pain.   Yes Historical Provider, MD   pantoprazole (PROTONIX) 40 MG tablet Take 1 tablet (40 mg total) by mouth daily. 01/14/14  Yes Lendon Colonel, NP  predniSONE (DELTASONE) 5 MG tablet Take 5 mg by mouth daily.   Yes Historical Provider, MD   Current Facility-Administered Medications  Medication Dose Route Frequency Provider Last Rate Last Dose  . carvedilol (COREG) tablet 6.25 mg  6.25 mg Oral BID WC Rogelia Mire, NP   6.25 mg at 07/19/14 1541  . clopidogrel (PLAVIX) tablet 75 mg  75 mg  Oral Daily Rogelia Mire, NP      . guaiFENesin-dextromethorphan [facility] DM) 100-10 MG/5ML syrup 15 mL  15 mL Oral Q4H PRN Serafina Mitchell, MD      . hydrALAZINE (APRESOLINE) injection 10 mg  10 mg Intravenous Q4H PRN Rogelia Mire, NP   10 mg at 07/19/14 1508  . labetalol (NORMODYNE,TRANDATE) injection 10 mg  10 mg Intravenous Q10 min PRN Serafina Mitchell, MD   10 mg at 07/19/14 1338  . morphine injection 2-4 mg  2-4 mg Intravenous Q1H PRN Serafina Mitchell, MD   2 mg at 07/19/14 1240  . ondansetron (ZOFRAN) injection 4 mg  4 mg Intravenous Q6H PRN Serafina Mitchell, MD      . oxyCODONE (Oxy IR/ROXICODONE) immediate release tablet 5-10 mg  5-10 mg Oral Q4H PRN Serafina Mitchell, MD      . phenol (CHLORASEPTIC) mouth spray 1 spray  1 spray Mouth/Throat PRN Serafina Mitchell, MD      . sodium chloride 0.9 % injection 3 mL  3 mL Intravenous PRN Serafina Mitchell, MD       Facility-Administered Medications Ordered in Other Encounters  Medication Dose Route Frequency Provider Last Rate Last Dose  . fentaNYL (SUBLIMAZE) injection   Intravenous PRN Carylon Perches, MD   25 mcg at 05/05/12 1544  . midazolam (VERSED) 5 MG/5ML injection   Intravenous PRN Carylon Perches, MD   2 mg at 05/05/12 1544   Labs: Basic Metabolic Panel:  Recent Labs Lab 07/19/14 0657  NA 136*  K 4.7  CL 100  GLUCOSE 86  BUN 46*  CREATININE 8.30*  CBC:  Recent Labs Lab 07/19/14 0657  HGB 14.3  HCT 42.0   Cardiac Enzymes: No results found for this  basename: CKTOTAL, CKMB, CKMBINDEX, TROPONINI,  in the last 168 hours ROS: As per HPI otherwise negative.   Physical Exam: Filed Vitals:   07/19/14 1629 07/19/14 1634 07/19/14 1639 07/19/14 1644  BP: 189/95 188/85 173/86 176/84  Pulse: 101 101 102 104  Temp:      TempSrc:      Resp: 15 18 17 9   Height:      Weight:      SpO2: 98% 97% 98% 98%     General: Emaciated AA male with only minimal CP at present Head: Normocephalic, atraumatic, sclera non-icteric, mucus membranes are moist Neck: Supple. JVD not elevated. Lungs: Clear bilaterally to auscultation without wheezes, rales, or rhonchi. Breathing is unlabored. Heart:  Tachy reg 2/6 murmur Abdomen: Soft, non-tender, non-distended with normoactive bowel sounds.M-S:  Marked muscle wasting. Lower extremities: without edema - left great toe dry gangrene Neuro: Alert and oriented X 3. Moves all extremities spontaneously. Psych:  Responds to questions appropriately with baseline affect Dialysis Access: left thigh graft + bruit  Dialysis Orders: Center: [facility] TTS 3.5 hr 180 400/800 2 K 2 Ca profile 2 EDW 47 venofer 50 per week, Calcitriol 2.5 no Aranesp heparin 4000 Recent labs:  Hgb 12.1 10/22 30% sat ferritin 168 iPTH 598 Ca 8.3 corr P 6.2 alb 3.7  Assessment/Plan: 1. CP- hx CAD with RCA stent earlier this year - intial EKG neg acute change - cycle enzymes - follow per cards 2. Left great toe ulcer/ PVD s/p PTA and stent - per [facility] 3. ESRD -  TTS - HD in am 4. Hypertension/volume  - leaving slightly below edw with post HD BPs of 120 - 130 range; gains modest 1.5 - 2.5  kg at the most; BP ^^^ post procedure today - didn't take am meds; outpt meds to be resumed 5. Anemia  - no ESA for now 6. Metabolic bone disease -  Continue calcitirol - ca on the low side- at outpt center on 2 Ca bath - use 2.25 here; on 4 phoslo ac 7. Nutrition - poor, very underweight - renal diet + vitamin  [name], [place]  [phone] 07/19/2014, 4:49 PM

## 2014-07-19 NOTE — Progress Notes (Signed)
Cardiology Interval History Note  Troponin elevation to 0.65 on evening labs.  This is in the setting of significant hypertension earlier in this patient with ESRD on HD and known CAD.  CK and CK-MB are normal.  He is chest pain free and repeat EKG this evening is without ischemic changes. The troponin is likely a marker of increased demand during hypertension earlier and poor clearance of troponin due to ESRD.  Not ACS.  Continue current medical management (aspirin and plavix. No heparin). Continue to trend troponin.  Stephani Police, MD

## 2014-07-19 NOTE — H&P (View-Only) (Signed)
Patient name: [name] MRN: [id number] DOB: 1960-12-03 Sex: male     Chief Complaint  Patient presents with  . PVD    Left great toe gangrene,  ( has left thigh AVG, HD on  Tuesday, Thursday, and Saturday at  [facility])    HISTORY OF PRESENT ILLNESS: The patient is back today for followup. He initially presented with bilateral lower extremity ulcers on both great toes. On 01/03/2014, he underwent atherectomy and angioplasty of the left popliteal artery. On 01/11/2014, he underwent atherectomy with drug coded balloon angioplasty of the right popliteal artery as well as angioplasty of the right dorsalis pedis artery. After each procedure the patient developed chest pain. He ultimately underwent coronary stenting. I have been following him for bilateral wound on his feet. 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 He was able to heal areas at the base of his bilateral great toes.   He still has dry gangrene to the left great toe.  All other ulcers have healed.  He'll occasionally gets some pain in the left great toe.  Past Medical History  Diagnosis Date  . ESRD on hemodialysis     ESRD with renal transplant in 1987 (cadaveric) after short period of dialysis.  Transplant failed in 2004 and he went back on hemodialysis. As of April 2015 getting HD via L thigh AVG on a TTS schedule at [facility].   Cause of ESRD was HTN.    Marland Kitchen Hypertension   . Hx of kidney transplant     1987 > 2004  . Gout tophi   . Chronic steroid use     Has severe gout. Did not tolerate Allopurinol. Do not taper per PCP   . Systolic CHF, chronic     2 D echo 04/2012 with EF of 45 %   . Malnutrition   . 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 F/U with Dr Donne Hazel. Started on TNA  . Anemia associated with chronic renal failure   . Hepatitis C   .  Heart murmur   . GERD (gastroesophageal reflux disease)   . H/O hiatal hernia   . Arthritis   . DVT (deep venous thrombosis)   . Peripheral vascular disease   . Prostate cancer     Past Surgical History  Procedure Laterality Date  . Laparotomy  04/14/2012    Procedure: EXPLORATORY LAPAROTOMY;  Surgeon: Joyice Faster. Cornett, MD;  Location: [place];  Service: General;  Laterality: N/A;  . Colon resection  04/14/2012  . Laparotomy  04/22/2012    Procedure: EXPLORATORY LAPAROTOMY;  Surgeon: Adin Hector, MD;  Location: [place];  Service: General;  Laterality: N/A;  lysis of adhesions, omentoplasty, repair small bowel  . Thrombectomy w/ embolectomy  04/27/2012    Procedure: THROMBECTOMY ARTERIOVENOUS GORE-TEX GRAFT;  Surgeon: Angelia Mould, MD;  Location: [place];  Service: Vascular;  Laterality: Left;  Thrombectomy of left thigh arteriovenous gortex graft  . Prostectomy  2011  . Renal grafts    . Pelvic abcess drainage Right 6/14    removal drain s/p bowl resection 13  . Transanal excision of rectal mass N/A 03/30/2013    Procedure: EXCISION OF anal MASS;  Surgeon: Joyice Faster. Cornett, MD;  Location: [place];  Service: General;  Laterality: N/A;  Exam under anesthesia with excision anal verge mass    History   Social History  . Marital Status: Single    Spouse Name: N/A    Number of Children: N/A  . Years of Education: N/A   Occupational History  . Not on file.   Social History Main Topics  . Smoking status: Former Smoker -- 1.00 packs/day for 10 years    Types: Cigarettes    Quit date: 03/23/1973  . Smokeless tobacco: Never Used     Comment: Quit 30 years ago  . Alcohol Use: No  . Drug Use: No  . Sexual Activity: No   Other Topics Concern  . Not on file   Social History Narrative   Currently in the nursing home until end of the month then will go to live with sister.           Family History  Problem Relation Age of Onset  . Hypertension Father   . Pneumonia Brother   .  Lung disease Brother     Allergies as of 07/10/2014 - Review Complete 07/10/2014  Allergen Reaction Noted  . Allopurinol Other (See Comments) 02/27/2010    Current Outpatient Prescriptions on File Prior to Visit  Medication Sig Dispense Refill  . aspirin EC 81 MG tablet Take 81 mg by mouth daily.      Marland Kitchen atorvastatin (LIPITOR) 40 MG tablet Take 1 tablet (40 mg total) by mouth daily at 6 PM.  30 tablet  6  . calcium acetate (PHOSLO) 667 MG tablet Take 2,001 mg by mouth 3 (three) times daily.       . carvedilol (COREG) 6.25 MG tablet Take 1 tablet (6.25 mg total) by mouth 2 (two) times daily with a meal.  60 tablet  6  . cinacalcet (SENSIPAR) 60 MG tablet Take 60 mg by mouth daily.      . clopidogrel (PLAVIX) 75 MG tablet Take 1 tablet (75 mg total) by mouth daily with breakfast.  30 tablet  10  . COLCRYS 0.6 MG tablet Take 0.6 mg by mouth daily.       Marland Kitchen LIDOCAINE EX Apply 1 application topically See admin instructions. Apply to inject site on dialysis days (Tues, Thur, Sat) if needed for pain      . loperamide (IMODIUM A-D) 2 MG tablet Take 2 mg by mouth daily as needed. For diarrhea      . multivitamin (RENA-VIT) TABS tablet Take 1 tablet by mouth daily.  30 tablet  0  . nitroGLYCERIN (NITRODUR - DOSED IN MG/24 HR) 0.3 mg/hr patch Place 1 patch (0.3 mg total) onto the skin daily.  30 patch  12  . oxyCODONE-acetaminophen (PERCOCET/ROXICET) 5-325 MG per tablet Take 1 tablet by mouth every 6 (six) hours as needed. For pain  30 tablet  0  . pantoprazole (PROTONIX) 40 MG tablet Take 1 tablet (40 mg total) by mouth daily.  30 tablet  6  . predniSONE (DELTASONE) 5 MG tablet Take 5 mg by mouth daily.       Current Facility-Administered Medications on File Prior to Visit  Medication Dose Route Frequency Provider Last Rate Last Dose  . fentaNYL (SUBLIMAZE) injection   Intravenous PRN Carylon Perches, MD   25 mcg at 05/05/12 1544  . midazolam (VERSED) 5 MG/5ML injection   Intravenous PRN Carylon Perches, MD   2 mg at 05/05/12 1544     REVIEW OF SYSTEMS: No changes from prior visit  PHYSICAL EXAMINATION:  Vital signs are BP 160/48  Pulse 74  Temp(Src) 97.6 F (36.4 C) (Oral)  Resp 16  Ht 5\' 11"  (1.803 m)  Wt 108 lb 8 oz (49.215 kg)  BMI 15.14 kg/m2  SpO2 98% General: The patient appears their stated age. HEENT:  No gross abnormalities Pulmonary:  Non labored breathing Abdomen: Soft and non-tender Musculoskeletal: There are no major deformities. Neurologic: No focal weakness or paresthesias are detected, Skin: Left great toe gangrene, dry  Psychiatric: The patient has normal affect. Cardiovascular: There is a regular rate and rhythm without significant murmur appreciated.   Diagnostic Studies Duplex ultrasound was ordered and reviewed today.  The patient has significant calcification which prohibits ankle-brachial index measurement.  There is an area block elevation in the left superficial femoral artery with a velocity ratio 4.7.  Similarly there is a velocity ratio 3.9 in the right popliteal artery.  His most likely correlate with the previously treated areas.  Assessment: History of bilateral ulcers and peripheral vascular disease, atherosclerosis Plan: The patient continues to have dry gangrene of the left great toe without evidence of infection.  I have not recommended amputation but rather auto amputation.  Because of the velocity elevation in the left popliteal artery by ultrasound and the fact that he still has not really healed his wound in the left, I recommended proceeding with angiography.  There would be cannulation of the right femoral artery, an aortogram with bilateral runoff to evaluate the stenoses in both groins and present intervention in the left superficial femoral/popliteal area.  The patient still has a dialysis graft in the left leg which I am reluctant to ligate given how stable his vascular disease has been a left his procedure is been scheduled for  Wednesday, October 28  V. Leia Alf, M.D. [facility] [place] Office: [phone] Pager:  [phone]

## 2014-07-19 NOTE — Op Note (Signed)
Patient name: [name] MRN: [id number] DOB: 03-20-1961 Sex: male  07/19/2014 Pre-operative Diagnosis: Left leg ulcer Post-operative diagnosis:  Same Surgeon:  Eldridge Abrahams Procedure Performed:  1.  Ultrasound-guided access, right femoral artery  2.  Abdominal aortogram  3.  Bilateral lower extremity runoff  4.  Cutting balloon and drug coated balloon angioplasty, left popliteal artery    Indications:  The patient has previous undergone bilateral percutaneous interventions for bilateral ulcers.  He has healed his right leg.  He has not yet healed his left.  Ultrasound identified bilateral stenoses at the treated area.  He is back for further evaluation and possible intervention on the left leg.  Procedure:  The patient was identified in the holding area and taken to room 8.  The patient was then placed supine on the table and prepped and draped in the usual sterile fashion.  A time out was called.  Ultrasound was used to evaluate the right common femoral artery.  It was patent .  A digital ultrasound image was acquired.  A micropuncture needle was used to access the right common femoral artery under ultrasound guidance.  An 018 wire was advanced without resistance and a micropuncture sheath was placed.  The 018 wire was removed and a benson wire was placed.  The micropuncture sheath was exchanged for a 5 french sheath.  An omniflush catheter was advanced over the wire to the level of L-1.  An abdominal angiogram was obtained.  Next, using the omniflush catheter and a benson wire, the aortic bifurcation was crossed and the catheter was placed into theleft external iliac artery and left runoff was obtained.  right runoff was performed via retrograde sheath injections.  Findings:   Aortogram:  No significant aortic stenosis is identified.  Bilateral common and external iliac arteries are widely patent.  Right Lower Extremity:  Left common femoral artery is heavily calcified with  areas of stenosis approximately 50-60%.  The profundus femoral artery is widely patent.  The superficial femoral artery is heavily calcified throughout.  There are no hemodynamically significant lesions.  Within the popliteal artery behind the knee, there is approximately an 80% lesion.  Three-vessel runoff.  Left Lower Extremity:  Left common femoral artery is widely patent.  There is a dialysis loop graft in the left groin.  The arterial anastomosis and venous anastomosis appeared to be widely patent.  The superficial femoral artery is patent throughout it's course.  There is a recurrence of disease in the popliteal artery at the level of the patella.  There is also a calcific plaque in the below knee popliteal artery with three-vessel runoff.  Intervention:  After the above images were acquired, the decision was made to proceed with intervention.  Using a Kumpe catheter, the wire was advanced into the left superficial femoral artery.  A 6 French 55 cm sheath was then placed in the left superficial femoral artery.  The patient was fully heparinized.  A 014 wire was advanced across the lesion.  I selected a angiosculpt 5 x 100 balloon and performed cutting balloon angioplasty of the popliteal artery, across the joint space.  The balloon was taken to 10 atm for 3 minutes.  I then removed this balloon and inserted a 5 x 100 Lutonix drug coated balloon.  This was taken to nominal pressure and held for 3 minutes.  Completion angiogram revealed resolution of the stenosis with no change in the runoff.  The sheath was withdrawn to the  right external iliac artery.  The patient will be taken to the holding area for sheath pull once his coagulation profile corrects.  Impression:  #1  successful cutting and drug coated balloon angioplasty of a recurrent left popliteal artery stenosis using 5 x 100 balloon's  #2  recurrence of stenosis within the right popliteal artery    V. [name], M.D. [facility] [place] Office: [phone] Pager:  [phone]

## 2014-07-19 NOTE — Interval H&P Note (Signed)
History and Physical Interval Note:  07/19/2014 8:48 AM  [name]  has presented today for surgery, with the diagnosis of pvd  The various methods of treatment have been discussed with the patient and family. After consideration of risks, benefits and other options for treatment, the patient has consented to  Procedure(s): ABDOMINAL AORTAGRAM (N/A) as a surgical intervention .  The patient's history has been reviewed, patient examined, no change in status, stable for surgery.  I have reviewed the patient's chart and labs.  Questions were answered to the patient's satisfaction.     [name] IV, V. WELLS

## 2014-07-19 NOTE — Consult Note (Signed)
Patient ID: [name] MRN: [id number], DOB/AGE: 02/27/1961   Admit date: 07/19/2014  Primary Physician: Placido Sou, MD Primary Cardiologist: Corky Downs, MD  Vascular Surgeon: V.W. Trula Slade, MD  Pt. Profile:  53 y/o male with a h/o CAD, PVD, and hypertensive nephropathy->ESRD, whom we've been asked to eval 2/2 chest pain following peripheral angio procedure.  Problem List  Past Medical History  Diagnosis Date  . ESRD on hemodialysis     a. ESRD 2/2 HTN with renal transplant in 1987 (cadaveric) after short period of dialysis;  b. Transplant failed in 2004 and he went back on HD;  c. As of 10/15 getting HD via L thigh AVG on a TTS schedule at [facility] on [facility].  . Hypertension   . Hx of kidney transplant     a. 1987-> back on HD since 2004  . Gout tophi   . Chronic steroid use     a. Has severe gout. Did not tolerate Allopurinol. Do not taper per PCP   . Systolic CHF, chronic     2 D echo 04/2012 with EF of 45 %   . Malnutrition   . Hx SBO 04/2012    a. 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 F/U with Dr Donne Hazel. Started on TNA  . Anemia associated with chronic renal failure   . Hepatitis C   . GERD (gastroesophageal reflux disease)   . H/O hiatal hernia   . Arthritis   . History of DVT (deep vein thrombosis)   . Peripheral vascular disease     a. 12/2013 PTA of L Pop;  b. 12/2013 PTA R pop, R DP;  c. 06/2014 L Pop CBA/DCB PTA.  . Prostate cancer   . CAD (coronary artery disease)     a. 12/2013 Cath/PCI: EF 45-50%, LM Ca2+, LAD 30-40p, 4m, D1/2/3 min irregs, LCX 50ost, 30-27m, RCA 70p, 95/58m (Rota->3.0x23 Xience distal, 3.0x23 Xience mid, 3.0x28 Xience prox).  . Chronic diastolic CHF (congestive heart failure)     a. 12/2013 Echo: EF 55-60%, mild LVH, nl wall motion, Gr 2 DD.  . Dry gangrene     a. L great toe    Past Surgical History  Procedure Laterality Date  . Laparotomy   04/14/2012    Procedure: EXPLORATORY LAPAROTOMY;  Surgeon: Joyice Faster. Cornett, MD;  Location: [place];  Service: General;  Laterality: N/A;  . Colon resection  04/14/2012  . Laparotomy  04/22/2012    Procedure: EXPLORATORY LAPAROTOMY;  Surgeon: Adin Hector, MD;  Location: [place];  Service: General;  Laterality: N/A;  lysis of adhesions, omentoplasty, repair small bowel  . Thrombectomy w/ embolectomy  04/27/2012    Procedure: THROMBECTOMY ARTERIOVENOUS GORE-TEX GRAFT;  Surgeon: Angelia Mould, MD;  Location: [place];  Service: Vascular;  Laterality: Left;  Thrombectomy of left thigh arteriovenous gortex graft  . Prostectomy  2011  . Renal grafts    . Pelvic abcess drainage Right 6/14    removal drain s/p bowl resection 13  . Transanal excision of rectal mass N/A 03/30/2013    Procedure: EXCISION OF anal MASS;  Surgeon: Joyice Faster. Cornett, MD;  Location: [place];  Service: General;  Laterality: N/A;  Exam under anesthesia with excision anal verge mass     Allergies  Allergies  Allergen Reactions  . Allopurinol Other (See Comments)    REACTION: decreased platelets    HPI  53  y/o male with the above complex problem list.  He has a long h/o ESRD s/p renal transplant with subsequent failure and resumption of dialysis in 2004.  He also has a complex peripheral vascular history and is s/p multiple interventions upon bilateral LE as outlined above.  In April of this year, he developed chest pain in the setting of marked hypertensive following peripheral angio procedure.  He was admitted and seen by cardiology with subsequent cath revealing severe RCA dzs, which was treated with rotablator followed by DES x 3.  He says that since then, he's had a handful of episodes of chest pain, with either rest or exertion, usually lasting only a few mins and resolving spontaneously.    He was last seen by Dr. Claiborne Billings in Sept @ which time he was doing well from a cardiac standpoint.  He subsequently f/u with Dr. Trula Slade  on 10/19 for dry gangrene of the L great toe w/ evidence of infxn.  Repeat angio was advised, and he underwent this today and subsequently required cutting balloon angioplasty and drug coated balloon angioplasty of the left popliteal secondary to recurrent stenosis.    Post-procedure, while in holding, he was noted to be markedly hypertensive and required multiple doses of IV hydralazine and labetalol.  Of note, he did not take any of his medications this morning.   In the setting of HTN, he developed chest pain.  This was slightly worse than what he's had at home and resolved after about 20 mins with BP control.  He is currently resting quietly and is pain free.  BP's have crept back up into the 190's.  We have been asked to eval.  Home Medications  Prior to Admission medications   Medication Sig Start Date End Date Taking? Authorizing Provider  aspirin EC 81 MG tablet Take 81 mg by mouth daily.   Yes Historical Provider, MD  atorvastatin (LIPITOR) 40 MG tablet Take 1 tablet (40 mg total) by mouth daily at 6 PM. 01/14/14  Yes Lendon Colonel, NP  calcium acetate (PHOSLO) 667 MG tablet Take 2,001 mg by mouth 3 (three) times daily.  01/24/13  Yes Historical Provider, MD  carvedilol (COREG) 6.25 MG tablet Take 1 tablet (6.25 mg total) by mouth 2 (two) times daily with a meal. 01/14/14  Yes Lendon Colonel, NP  cinacalcet (SENSIPAR) 60 MG tablet Take 60 mg by mouth daily.   Yes Historical Provider, MD  clopidogrel (PLAVIX) 75 MG tablet Take 1 tablet (75 mg total) by mouth daily with breakfast. 01/14/14  Yes Lendon Colonel, NP  COLCRYS 0.6 MG tablet Take 0.6 mg by mouth daily.  01/04/13  Yes Historical Provider, MD  LIDOCAINE EX Apply 1 application topically See admin instructions. Apply to inject site on dialysis days (Tues, Thur, Sat) if needed for pain   Yes Historical Provider, MD  loperamide (IMODIUM A-D) 2 MG tablet Take 2 mg by mouth daily as needed for diarrhea or loose stools.  08/13/12  Yes  Erroll Luna, MD  multivitamin (RENA-VIT) TABS tablet Take 1 tablet by mouth daily. 07/05/12  Yes Rosalia Hammers, MD  nitroGLYCERIN (NITRODUR - DOSED IN MG/24 HR) 0.3 mg/hr patch Place 1 patch (0.3 mg total) onto the skin daily. 01/14/14  Yes Lendon Colonel, NP  oxyCODONE (OXY IR/ROXICODONE) 5 MG immediate release tablet Take 5 mg by mouth every 6 (six) hours as needed for severe pain.   Yes Historical Provider, MD  pantoprazole (PROTONIX) 40 MG tablet Take  1 tablet (40 mg total) by mouth daily. 01/14/14  Yes Lendon Colonel, NP  predniSONE (DELTASONE) 5 MG tablet Take 5 mg by mouth daily.   Yes Historical Provider, MD    Family History  Family History  Problem Relation Age of Onset  . Hypertension Father   . Pneumonia Brother   . Lung disease Brother    Social History  History   Social History  . Marital Status: Single    Spouse Name: N/A    Number of Children: N/A  . Years of Education: N/A   Occupational History  . Not on file.   Social History Main Topics  . Smoking status: Former Smoker -- 1.00 packs/day for 10 years    Types: Cigarettes    Quit date: 03/23/1973  . Smokeless tobacco: Never Used     Comment: Quit 30 years ago  . Alcohol Use: No  . Drug Use: No  . Sexual Activity: No   Other Topics Concern  . Not on file   Social History Narrative   Currently in the nursing home until end of the month then will go to live with sister.           Review of Systems General:  No chills, fever, night sweats or weight changes.  Cardiovascular:  +++ chest pain, no dyspnea on exertion, edema, orthopnea, palpitations, paroxysmal nocturnal dyspnea. Dermatological: No rash, lesions/masses Respiratory: No cough, dyspnea Urologic: No hematuria, dysuria Abdominal:   No nausea, vomiting, diarrhea, bright red blood per rectum, melena, or hematemesis Neurologic:  No visual changes, wkns, changes in mental status. All other systems reviewed and are otherwise negative  except as noted above.  Physical Exam  Blood pressure 191/92, pulse 92, temperature 97.6 F (36.4 C), temperature source Oral, resp. rate 17, height 5\' 11"  (1.803 m), weight 110 lb (49.896 kg), SpO2 100.00%.  General: Pleasant, NAD Psych: Flat affect. Neuro: Alert and oriented X 3. Moves all extremities spontaneously. HEENT: Normal  Neck: Supple without bruits or JVD. Lungs:  Resp regular and unlabored, CTA. Heart: RRR no s3, s4, or murmurs. Abdomen: Soft, non-tender, non-distended, BS + x 4.  Extremities: No clubbing, cyanosis or edema. DP/PT 1+ bilaterally.  Labs  Lab Results  Component Value Date   WBC 9.5 01/16/2014   HGB 14.3 07/19/2014   HCT 42.0 07/19/2014   MCV 87.2 01/16/2014   PLT 166 01/16/2014     Recent Labs Lab 07/19/14 0657  NA 136*  K 4.7  CL 100  BUN 46*  CREATININE 8.30*  GLUCOSE 86   Lab Results  Component Value Date   CHOL 121 01/04/2014   HDL 68 01/04/2014   LDLCALC 26 01/04/2014   TRIG 137 01/04/2014   Radiology/Studies  No results found.  ECG  St, 102, non-specific st/t changes.  ASSESSMENT AND PLAN  1.  USA/CAD:  Pt has a h/o CAD s/p RCA rotablator PCI and DES x 3 to the RCA in April of this year.  At that time, he developed chest pain in the setting of marked HTN following a PV procedure, and he has done the same again today.  He is currently pain free.  ECG is non-acute.  Rec resumption of home meds including asa, plavix, bb, statin.  Cycle CE to r/o MI.  Provided that he rules out, would likely defer further ischemic eval.  2.  HTN:  Poorly controlled.  Resume BB - did not take this AM.  I've ordered prn hydralazine.  Follow  closely tonight.  Will likely need an additional agent.  3.  PVD:  S/p repeat PTA/CBA/DCB of the L popliteal artery.  Vascular surgery following.  4.  ESRD:  T/Th/Sat dialysis.  5.  Chronic diastolic CHF:  Volume looks good.  Resume bb.  Follow hr/bp.   Signed, Murray Hodgkins, NP 07/19/2014, 3:04  PM  Agree with note by Ignacia Bayley NP  ATSP with post PV procedure/ intervention CP. Pt has H/O CAD. He has done this before. HSRA/Stent RCA earlier this year. BP was elevated and pt didn't take meds this am. EKG without acute changes, NSSTTWC. Agree with observation overnight, cycle enzymes and resume home meds. Will follow with you.   Lorretta Harp, M.D., [place], [facility], Laverta [place] [address] [place], [place]  [address]  [phone] 07/19/2014 3:57 PM

## 2014-07-19 NOTE — Progress Notes (Signed)
Site area: Right groin Site Prior to Removal:  Level 0 Pressure Applied For:30 mins Manual:   yes Patient Status During Pull:  Resting. Post Pull Site:  Level 0 Post Pull Instructions Given:  yes Post Pull Pulses Present: Yes Dressing Applied:  Yes Bedrest begins @ 1275 PM Comments:

## 2014-07-19 NOTE — Progress Notes (Signed)
Nelda Severe relieved by Suella Broad RN.  Pt resting with eyes closed, denies any discomfort at this time

## 2014-07-20 ENCOUNTER — Encounter (HOSPITAL_COMMUNITY): Payer: Self-pay | Admitting: *Deleted

## 2014-07-20 DIAGNOSIS — R7989 Other specified abnormal findings of blood chemistry: Secondary | ICD-10-CM

## 2014-07-20 DIAGNOSIS — I998 Other disorder of circulatory system: Secondary | ICD-10-CM

## 2014-07-20 LAB — COMPREHENSIVE METABOLIC PANEL
ALBUMIN: 2.8 g/dL — AB (ref 3.5–5.2)
ALT: 24 U/L (ref 0–53)
ANION GAP: 19 — AB (ref 5–15)
AST: 34 U/L (ref 0–37)
Alkaline Phosphatase: 124 U/L — ABNORMAL HIGH (ref 39–117)
BILIRUBIN TOTAL: 0.5 mg/dL (ref 0.3–1.2)
BUN: 51 mg/dL — AB (ref 6–23)
CHLORIDE: 92 meq/L — AB (ref 96–112)
CO2: 25 meq/L (ref 19–32)
Calcium: 8.6 mg/dL (ref 8.4–10.5)
Creatinine, Ser: 9.97 mg/dL — ABNORMAL HIGH (ref 0.50–1.35)
GFR calc Af Amer: 6 mL/min — ABNORMAL LOW (ref >90)
GFR, EST NON AFRICAN AMERICAN: 5 mL/min — AB (ref >90)
GLUCOSE: 97 mg/dL (ref 70–99)
Potassium: 5.1 mEq/L (ref 3.7–5.3)
Sodium: 136 mEq/L — ABNORMAL LOW (ref 137–147)
Total Protein: 6.6 g/dL (ref 6.0–8.3)

## 2014-07-20 LAB — CBC
HEMATOCRIT: 35.6 % — AB (ref 39.0–52.0)
HEMOGLOBIN: 11.7 g/dL — AB (ref 13.0–17.0)
MCH: 28.3 pg (ref 26.0–34.0)
MCHC: 32.9 g/dL (ref 30.0–36.0)
MCV: 86.2 fL (ref 78.0–100.0)
Platelets: 147 10*3/uL — ABNORMAL LOW (ref 150–400)
RBC: 4.13 MIL/uL — ABNORMAL LOW (ref 4.22–5.81)
RDW: 14.8 % (ref 11.5–15.5)
WBC: 6.1 10*3/uL (ref 4.0–10.5)

## 2014-07-20 LAB — TROPONIN I
Troponin I: 0.7 ng/mL (ref <0.30)
Troponin I: 0.73 ng/mL (ref <0.30)

## 2014-07-20 LAB — PROTIME-INR
INR: 1.11 (ref 0.00–1.49)
Prothrombin Time: 14.4 seconds (ref 11.6–15.2)

## 2014-07-20 MED ORDER — MORPHINE SULFATE 4 MG/ML IJ SOLN
INTRAMUSCULAR | Status: AC
  Administered 2014-07-20: 4 mg
  Filled 2014-07-20: qty 1

## 2014-07-20 MED ORDER — COLCHICINE 0.6 MG PO TABS: 0.3000 mg | ORAL_TABLET | ORAL | Status: DC

## 2014-07-20 NOTE — Progress Notes (Signed)
Utilization Review Completed.Donne Anon T10/29/2015

## 2014-07-20 NOTE — Procedures (Signed)
I was present at this dialysis session. I have reviewed the session itself and made appropriate changes. .  Just above EDW.  AVG.  No c/o or needs.  Tolerating HD well.  Pt w/o any inpatient renal needs.  Pearson Grippe  MD 07/20/2014, 9:07 AM

## 2014-07-20 NOTE — Progress Notes (Signed)
  [facility] Progress Note  07/20/2014 7:42 AM 1 Day Post-Op  Subjective:  Having intermittent pain of left foot. Same as before arteriogram. Denies chest pain and shortness of breath.   Tmax 99.1 BP sys 145/66 this am 02 98% RA  Filed Vitals:   07/20/14 0700  BP: 145/66  Pulse: 77  Temp: 98.1 F (36.7 C)  Resp: 12    Physical Exam: Incisions:  Right groin soft without hematoma.  Extremities:  Left great toe dry gangrene. Feet are warm bilaterally Cardiac: regular rate and rhythm, no murmurs Lungs: clear to auscultation bilaterally   CBC    Component Value Date/Time   WBC 6.1 07/20/2014 0111   RBC 4.13* 07/20/2014 0111   HGB 11.7* 07/20/2014 0111   HCT 35.6* 07/20/2014 0111   PLT 147* 07/20/2014 0111   MCV 86.2 07/20/2014 0111   MCH 28.3 07/20/2014 0111   MCHC 32.9 07/20/2014 0111   RDW 14.8 07/20/2014 0111   LYMPHSABS 1.1 03/23/2013 1136   MONOABS 0.8 03/23/2013 1136   EOSABS 0.1 03/23/2013 1136   BASOSABS 0.0 03/23/2013 1136    BMET    Component Value Date/Time   NA 136* 07/20/2014 0111   K 5.1 07/20/2014 0111   CL 92* 07/20/2014 0111   CO2 25 07/20/2014 0111   GLUCOSE 97 07/20/2014 0111   BUN 51* 07/20/2014 0111   CREATININE 9.97* 07/20/2014 0111   CREATININE 7.80* 01/16/2014 1437   CALCIUM 8.6 07/20/2014 0111   GFRNONAA 5* 07/20/2014 0111   GFRAA 6* 07/20/2014 0111    INR    Component Value Date/Time   INR 1.11 07/20/2014 0111     Intake/Output Summary (Last 24 hours) at 07/20/14 0742 Last data filed at 07/20/14 0000  Gross per 24 hour  Intake    480 ml  Output      0 ml  Net    480 ml     Assessment:  53 y.o. male is s/p: aortogram with bilateral LE runoff and balloon angioplasty of recurrent left popliteal artery stenosis.   Plan: -No complaints of chest pain overnight.  Elevated troponin in setting of hypertension and CKD. Normal CK-MB. Repeat ECG last evening without any ischemic changes.  Blood pressure is stable  this morning at 145/66. Will wait for last troponin this morning. Appreciate cardiology following. -ESRD: plan for dialysis today. Appreciate nephrology following. -DVT prophylaxis:  lovenox -Dispo: Patient has been asymptomatic. Will continue to observe this morning. Anticipate discharge later today after HD.    Virgina Jock, PA-C [facility] Office: [phone] Pager: [phone] 07/20/2014 7:42 AM

## 2014-07-20 NOTE — Progress Notes (Signed)
Subjective:  No further CP. Feels better  Objective:  Temp:  [97.4 F (36.3 C)-99.1 F (37.3 C)] 97.4 F (36.3 C) (10/29 1134) Pulse Rate:  [57-106] 78 (10/29 1134) Resp:  [9-34] 11 (10/29 1134) BP: (100-269)/(46-117) 139/75 mmHg (10/29 1134) SpO2:  [95 %-100 %] 99 % (10/29 1134) Weight:  [103 lb 9.9 oz (47 kg)-104 lb 11.5 oz (47.5 kg)] 103 lb 9.9 oz (47 kg) (10/29 1134) Weight change: -5 lb 15.1 oz (-2.696 kg)  Intake/Output from previous day: 10/28 0701 - 10/29 0700 In: 480 [P.O.:480] Out: -   Intake/Output from this shift: Total I/O In: -  Out: 500 [Other:500]  Physical Exam: General appearance: alert and no distress Neck: no adenopathy, no carotid bruit, no JVD, supple, symmetrical, trachea midline and thyroid not enlarged, symmetric, no tenderness/mass/nodules Lungs: clear to auscultation bilaterally Heart: regular rate and rhythm, S1, S2 normal, no murmur, click, rub or gallop Extremities: extremities normal, atraumatic, no cyanosis or edema  Lab Results: Results for orders placed during the hospital encounter of 07/19/14 (from the past 48 hour(s))  POCT I-STAT, CHEM 8     Status: Abnormal   Collection Time    07/19/14  6:57 AM      Result Value Ref Range   Sodium 136 (*) 137 - 147 mEq/L   Potassium 4.7  3.7 - 5.3 mEq/L   Chloride 100  96 - 112 mEq/L   BUN 46 (*) 6 - 23 mg/dL   Creatinine, Ser 8.30 (*) 0.50 - 1.35 mg/dL   Glucose, Bld 86  70 - 99 mg/dL   Calcium, Ion 0.87 (*) 1.12 - 1.23 mmol/L   TCO2 25  0 - 100 mmol/L   Hemoglobin 14.3  13.0 - 17.0 g/dL   HCT 42.0  39.0 - 52.0 %  POCT ACTIVATED CLOTTING TIME     Status: None   Collection Time    07/19/14 10:20 AM      Result Value Ref Range   Activated Clotting Time 186    POCT ACTIVATED CLOTTING TIME     Status: None   Collection Time    07/19/14 11:11 AM      Result Value Ref Range   Activated Clotting Time 174    MRSA PCR SCREENING     Status: None   Collection Time    07/19/14  6:28 PM        Result Value Ref Range   MRSA by PCR NEGATIVE  NEGATIVE   Comment:            The GeneXpert MRSA Assay (FDA     approved for NASAL specimens     only), is one component of a     comprehensive MRSA colonization     surveillance program. It is not     intended to diagnose MRSA     infection nor to guide or     monitor treatment for     MRSA infections.  TROPONIN I     Status: Abnormal   Collection Time    07/19/14  7:16 PM      Result Value Ref Range   Troponin I 0.65 (*) <0.30 ng/mL   Comment:            Due to the release kinetics of cTnI,     a negative result within the first hours     of the onset of symptoms does not rule out     myocardial infarction with certainty.  If myocardial infarction is still suspected,     repeat the test at appropriate intervals.     CRITICAL RESULT CALLED TO, READ BACK BY AND VERIFIED WITH:     J CRAVEN,RN 2030 07/19/14 D BRADLEY  CBC     Status: Abnormal   Collection Time    07/19/14  7:16 PM      Result Value Ref Range   WBC 7.3  4.0 - 10.5 K/uL   RBC 4.49  4.22 - 5.81 MIL/uL   Hemoglobin 12.6 (*) 13.0 - 17.0 g/dL   HCT 38.8 (*) 39.0 - 52.0 %   MCV 86.4  78.0 - 100.0 fL   MCH 28.1  26.0 - 34.0 pg   MCHC 32.5  30.0 - 36.0 g/dL   RDW 14.8  11.5 - 15.5 %   Platelets 213  150 - 400 K/uL  CREATININE, SERUM     Status: Abnormal   Collection Time    07/19/14  7:16 PM      Result Value Ref Range   Creatinine, Ser 9.46 (*) 0.50 - 1.35 mg/dL   GFR calc non Af Amer 5 (*) >90 mL/min   GFR calc Af Amer 6 (*) >90 mL/min   Comment: (NOTE)     The eGFR has been calculated using the CKD EPI equation.     This calculation has not been validated in all clinical situations.     eGFR's persistently <90 mL/min signify possible Chronic Kidney     Disease.  CK TOTAL AND CKMB     Status: None   Collection Time    07/19/14  7:16 PM      Result Value Ref Range   Total CK 34  7 - 232 U/L   CK, MB 1.9  0.3 - 4.0 ng/mL   Relative Index RELATIVE  INDEX IS INVALID  0.0 - 2.5   Comment: WHEN CK < 100 U/L             CBC     Status: Abnormal   Collection Time    07/20/14  1:11 AM      Result Value Ref Range   WBC 6.1  4.0 - 10.5 K/uL   RBC 4.13 (*) 4.22 - 5.81 MIL/uL   Hemoglobin 11.7 (*) 13.0 - 17.0 g/dL   HCT 35.6 (*) 39.0 - 52.0 %   MCV 86.2  78.0 - 100.0 fL   MCH 28.3  26.0 - 34.0 pg   MCHC 32.9  30.0 - 36.0 g/dL   RDW 14.8  11.5 - 15.5 %   Platelets 147 (*) 150 - 400 K/uL   Comment: REPEATED TO VERIFY     SPECIMEN CHECKED FOR CLOTS  COMPREHENSIVE METABOLIC PANEL     Status: Abnormal   Collection Time    07/20/14  1:11 AM      Result Value Ref Range   Sodium 136 (*) 137 - 147 mEq/L   Potassium 5.1  3.7 - 5.3 mEq/L   Chloride 92 (*) 96 - 112 mEq/L   CO2 25  19 - 32 mEq/L   Glucose, Bld 97  70 - 99 mg/dL   BUN 51 (*) 6 - 23 mg/dL   Creatinine, Ser 9.97 (*) 0.50 - 1.35 mg/dL   Calcium 8.6  8.4 - 10.5 mg/dL   Total Protein 6.6  6.0 - 8.3 g/dL   Albumin 2.8 (*) 3.5 - 5.2 g/dL   AST 34  0 - 37 U/L   ALT 24  0 - 53 U/L   Alkaline Phosphatase 124 (*) 39 - 117 U/L   Total Bilirubin 0.5  0.3 - 1.2 mg/dL   GFR calc non Af Amer 5 (*) >90 mL/min   GFR calc Af Amer 6 (*) >90 mL/min   Comment: (NOTE)     The eGFR has been calculated using the CKD EPI equation.     This calculation has not been validated in all clinical situations.     eGFR's persistently <90 mL/min signify possible Chronic Kidney     Disease.   Anion gap 19 (*) 5 - 15  PROTIME-INR     Status: None   Collection Time    07/20/14  1:11 AM      Result Value Ref Range   Prothrombin Time 14.4  11.6 - 15.2 seconds   INR 1.11  0.00 - 1.49  TROPONIN I     Status: Abnormal   Collection Time    07/20/14  1:16 AM      Result Value Ref Range   Troponin I 0.70 (*) <0.30 ng/mL   Comment:            Due to the release kinetics of cTnI,     a negative result within the first hours     of the onset of symptoms does not rule out     myocardial infarction with  certainty.     If myocardial infarction is still suspected,     repeat the test at appropriate intervals.     CRITICAL VALUE NOTED.  VALUE IS CONSISTENT WITH PREVIOUSLY REPORTED AND CALLED VALUE.  TROPONIN I     Status: Abnormal   Collection Time    07/20/14  7:16 AM      Result Value Ref Range   Troponin I 0.73 (*) <0.30 ng/mL   Comment:            Due to the release kinetics of cTnI,     a negative result within the first hours     of the onset of symptoms does not rule out     myocardial infarction with certainty.     If myocardial infarction is still suspected,     repeat the test at appropriate intervals.     CRITICAL VALUE NOTED.  VALUE IS CONSISTENT WITH PREVIOUSLY REPORTED AND CALLED VALUE.    Imaging: Imaging results have been reviewed  Assessment/Plan:   1. Active Problems: 2.   Atherosclerosis of native arteries of the extremities with ulceration 3. CAD 4. CP 5. CRI/HD  Time Spent Directly with Patient:  10 minutes  Length of Stay:  LOS: 1 day   Looks better today. No further CP. BP better as well. On HD now. Trop peaked at .7 (unsure of signif in setting of CRI). EKG w/o acute changes. Exam benign. No reason for further workup. OK for DC home from our stand point.   Miguel Medal J 07/20/2014, 11:50 AM

## 2014-07-23 NOTE — Discharge Summary (Signed)
[facility] Discharge Summary  [name] 1961/03/09 53 y.o. male  [id number]  Admission Date: 07/19/2014  Discharge Date: 07/20/2014  Physician: Harold Barban, MD  Admission Diagnosis: pvd   HPI:   This is a 53 y.o. male who initially presented with bilateral lower extremity ulcers on both great toes. On 01/03/2014, he underwent atherectomy and angioplasty of the left popliteal artery. On 01/11/2014, he underwent atherectomy with drug coded balloon angioplasty of the right popliteal artery as well as angioplasty of the right dorsalis pedis artery. After each procedure the patient developed chest pain. He ultimately underwent coronary stenting. I have been following him for bilateral wound on his feet. 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 He was able to heal areas at the base of his bilateral great toes.   He still has dry gangrene to the left great toe. All other ulcers have healed. He'll occasionally gets some pain in the left great toe.  Hospital Course:  The patient was admitted to the hospital and taken to the [facility] lab on 07/19/2014 and underwent:  1. Ultrasound-guided access, right femoral artery 2. Abdominal aortogram 3. Bilateral lower extremity runoff 4. Cutting balloon and drug coated balloon angioplasty, left popliteal artery  The patient tolerated the procedure well and was transported to the holding area for sheath pull once his coagulation profile corrects.   While in the holding area, the patient complained was hypertensive and complained of chest pain. He has had similar symptoms in the past after peripheral angiography procedures. Cardiology was consulted. The patient was admitted for further observation. His EKG showed no acute changes. Nephrology was also consulted for ESRD management.   He was observed overnight and denied any further chest pain. His troponins were elevated, but  CK-MB was normal. Repeat EKG showed no acute changes. On hospital day 1, he was asymptomatic. He received inpatient dialysis. Cardiology did not pursue further workup. He was discharged on on hospital day 1 in good condition.    CBC    Component Value Date/Time   WBC 6.1 07/20/2014 0111   RBC 4.13* 07/20/2014 0111   HGB 11.7* 07/20/2014 0111   HCT 35.6* 07/20/2014 0111   PLT 147* 07/20/2014 0111   MCV 86.2 07/20/2014 0111   MCH 28.3 07/20/2014 0111   MCHC 32.9 07/20/2014 0111   RDW 14.8 07/20/2014 0111   LYMPHSABS 1.1 03/23/2013 1136   MONOABS 0.8 03/23/2013 1136   EOSABS 0.1 03/23/2013 1136   BASOSABS 0.0 03/23/2013 1136    BMET    Component Value Date/Time   NA 136* 07/20/2014 0111   K 5.1 07/20/2014 0111   CL 92* 07/20/2014 0111   CO2 25 07/20/2014 0111   GLUCOSE 97 07/20/2014 0111   BUN 51* 07/20/2014 0111   CREATININE 9.97* 07/20/2014 0111   CREATININE 7.80* 01/16/2014 1437   CALCIUM 8.6 07/20/2014 0111   GFRNONAA 5* 07/20/2014 0111   GFRAA 6* 07/20/2014 0111     Discharge Instructions:   The patient is discharged to home with extensive instructions on wound care and progressive ambulation.  They are instructed not to drive or perform any heavy lifting until returning to see the physician in his office.  Discharge Instructions    Call MD for:  redness, tenderness, or signs of infection (pain, swelling, bleeding, redness, odor or green/yellow discharge around incision site)    Complete by:  As directed  Call MD for:  severe or increased pain, loss or decreased feeling  in affected limb(s)    Complete by:  As directed      Call MD for:  temperature >100.5    Complete by:  As directed      Driving Restrictions    Complete by:  As directed   No driving for 2 weeks     Increase activity slowly    Complete by:  As directed   Walk with assistance use walker or cane as needed     Lifting restrictions    Complete by:  As directed   No lifting for 1 week      Resume previous diet    Complete by:  As directed      may wash over wound with mild soap and water    Complete by:  As directed            Discharge Diagnosis:  pvd  Secondary Diagnosis: Patient Active Problem List   Diagnosis Date Noted  . Atherosclerosis of native arteries of the extremities with ulceration 07/19/2014  . Gangrene of toe 07/10/2014  . Critical lower limb ischemia 02/22/2014  . CAD- unstable angina after PTA - s/p RCA DES 01/13/14 02/22/2014  . PVD (peripheral vascular disease) 01/30/2014  . Atherosclerosis of native arteries of the extremities with ulceration(440.23) 01/30/2014  . Pain in limb-Right popliteal 01/20/2014  . Atherosclerosis of native arteries of the extremities with intermittent claudication 01/20/2014  . ESRD on hemodialysis 01/12/2014  . PAD (peripheral artery disease) 01/11/2014  . Angina, class III  01/11/2014  . Protein-calorie malnutrition, severe 01/05/2014  . Lower extremity ulceration 01/03/2014  . 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 Anemia 05/13/2012  . HTN (hypertension) 04/15/2012  . Chronic use of steroids 04/15/2012  . Malnutrition, calorie 04/15/2012  . Leg graft occlusion 04/15/2012  . Gout 04/14/2012  . SBO (small bowel obstruction) s/p EL/LOA and SBR x 2 04/03/2012   Past Medical History  Diagnosis Date  . ESRD on hemodialysis     a. ESRD 2/2 HTN with renal transplant in 1987 (cadaveric) after short period of dialysis;  b. Transplant failed in 2004 and he went back on HD;  c. As of 10/15 getting HD via L thigh AVG on a TTS schedule at [facility].  . Hypertension   . Hx of kidney transplant     a. 1987-> back on HD since 2004  . Gout tophi   . Chronic steroid use     a. Has severe gout. Did not tolerate Allopurinol. Do not taper per PCP   . Systolic CHF, chronic     2 D echo 04/2012 with EF of 45 %   . Malnutrition   . Hx SBO 04/2012    a. 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 F/U with Dr Donne Hazel. Started on TNA  . Anemia associated with chronic renal failure   . Hepatitis C   . GERD (gastroesophageal reflux disease)   . H/O hiatal hernia   . Arthritis   . History of DVT (deep vein thrombosis)   . Peripheral vascular disease     a. 12/2013 PTA of L Pop;  b. 12/2013 PTA R pop, R DP;  c. 06/2014 L Pop CBA/DCB PTA.  . Prostate cancer   . CAD (coronary artery disease)     a. 12/2013  Cath/PCI: EF 45-50%, LM Ca2+, LAD 30-40p, 64m, D1/2/3 min irregs, LCX 50ost, 30-29m, RCA 70p, 95/88m (Rota->3.0x23 Xience distal, 3.0x23 Xience mid, 3.0x28 Xience prox).  . Chronic diastolic CHF (congestive heart failure)     a. 12/2013 Echo: EF 55-60%, mild LVH, nl wall motion, Gr 2 DD.  . Dry gangrene     a. L great toe       Medication List    TAKE these medications        aspirin EC 81 MG tablet  Take 81 mg by mouth daily.     atorvastatin 40 MG tablet  Commonly known as:  LIPITOR  Take 1 tablet (40 mg total) by mouth daily at 6 PM.     calcium acetate 667 MG tablet  Commonly known as:  PHOSLO  Take 2,001 mg by mouth 3 (three) times daily.     carvedilol 6.25 MG tablet  Commonly known as:  COREG  Take 1 tablet (6.25 mg total) by mouth 2 (two) times daily with a meal.     clopidogrel 75 MG tablet  Commonly known as:  PLAVIX  Take 1 tablet (75 mg total) by mouth daily with breakfast.     COLCRYS 0.6 MG tablet  Generic drug:  colchicine  Take 0.6 mg by mouth daily.     LIDOCAINE EX  Apply 1 application topically See admin instructions. Apply to inject site on dialysis days (Tues, Thur, Sat) if needed for pain     loperamide 2 MG tablet  Commonly known as:  IMODIUM A-D  Take 2 mg by mouth daily as needed for diarrhea or loose stools.     multivitamin Tabs tablet  Take 1 tablet by mouth daily.     nitroGLYCERIN 0.3 mg/hr patch  Commonly known as:  NITRODUR - Dosed in mg/24 hr    Place 1 patch (0.3 mg total) onto the skin daily.     oxyCODONE 5 MG immediate release tablet  Commonly known as:  Oxy IR/ROXICODONE  Take 5 mg by mouth every 6 (six) hours as needed for severe pain.     pantoprazole 40 MG tablet  Commonly known as:  PROTONIX  Take 1 tablet (40 mg total) by mouth daily.     predniSONE 5 MG tablet  Commonly known as:  DELTASONE  Take 5 mg by mouth daily.     SENSIPAR 60 MG tablet  Generic drug:  cinacalcet  Take 60 mg by mouth daily.        Disposition: Home  Patient's condition: is Good  Follow up: 1. Dr. Trula Slade in 2 weeks   Virgina Jock, PA-C [facility] [phone] 07/23/2014  2:27 PM

## 2014-07-31 ENCOUNTER — Other Ambulatory Visit: Payer: Self-pay

## 2014-08-02 ENCOUNTER — Encounter (HOSPITAL_COMMUNITY)
Admission: RE | Disposition: A | Discharge status: Home / Self Care | Payer: Self-pay | Source: Ambulatory Visit | Attending: Surgery

## 2014-08-02 ENCOUNTER — Telehealth: Payer: Self-pay | Admitting: Surgery

## 2014-08-02 ENCOUNTER — Ambulatory Visit (HOSPITAL_COMMUNITY)
Admission: RE | Admit: 2014-08-02 | Discharge: 2014-08-02 | Disposition: A | Discharge status: Home / Self Care | Payer: Medicare Other | Source: Ambulatory Visit | Attending: Surgery | Admitting: Surgery

## 2014-08-02 ENCOUNTER — Other Ambulatory Visit: Payer: Self-pay | Admitting: *Deleted

## 2014-08-02 DIAGNOSIS — Z9862 Peripheral vascular angioplasty status: Secondary | ICD-10-CM

## 2014-08-02 DIAGNOSIS — Z992 Dependence on renal dialysis: Secondary | ICD-10-CM | POA: Insufficient documentation

## 2014-08-02 DIAGNOSIS — I70202 Unspecified atherosclerosis of native arteries of extremities, left leg: Secondary | ICD-10-CM | POA: Diagnosis not present

## 2014-08-02 DIAGNOSIS — I70245 Atherosclerosis of native arteries of left leg with ulceration of other part of foot: Secondary | ICD-10-CM

## 2014-08-02 DIAGNOSIS — Z9889 Other specified postprocedural states: Secondary | ICD-10-CM | POA: Diagnosis not present

## 2014-08-02 DIAGNOSIS — N186 End stage renal disease: Secondary | ICD-10-CM | POA: Diagnosis not present

## 2014-08-02 DIAGNOSIS — I120 Hypertensive chronic kidney disease with stage 5 chronic kidney disease or end stage renal disease: Secondary | ICD-10-CM | POA: Insufficient documentation

## 2014-08-02 DIAGNOSIS — M79672 Pain in left foot: Secondary | ICD-10-CM | POA: Diagnosis present

## 2014-08-02 DIAGNOSIS — I70201 Unspecified atherosclerosis of native arteries of extremities, right leg: Secondary | ICD-10-CM | POA: Diagnosis not present

## 2014-08-02 DIAGNOSIS — I739 Peripheral vascular disease, unspecified: Secondary | ICD-10-CM

## 2014-08-02 DIAGNOSIS — L97929 Non-pressure chronic ulcer of unspecified part of left lower leg with unspecified severity: Secondary | ICD-10-CM | POA: Diagnosis not present

## 2014-08-02 LAB — POCT I-STAT, CHEM 8
BUN: 35 mg/dL — ABNORMAL HIGH (ref 6–23)
CALCIUM ION: 0.97 mmol/L — AB (ref 1.12–1.23)
CREATININE: 7.6 mg/dL — AB (ref 0.50–1.35)
Chloride: 96 mEq/L (ref 96–112)
GLUCOSE: 85 mg/dL (ref 70–99)
HCT: 39 % (ref 39.0–52.0)
HEMOGLOBIN: 13.3 g/dL (ref 13.0–17.0)
Potassium: 4.1 mEq/L (ref 3.7–5.3)
Sodium: 139 mEq/L (ref 137–147)
TCO2: 30 mmol/L (ref 0–100)

## 2014-08-02 SURGERY — PTA FEMORAL POPLITEAL ARTERY
Laterality: Right

## 2014-08-02 MED ORDER — HEPARIN (PORCINE) IN NACL 2-0.9 UNIT/ML-% IJ SOLN
INTRAMUSCULAR | Status: AC
  Filled 2014-08-02: qty 1000

## 2014-08-02 MED ORDER — OXYCODONE HCL 5 MG PO TABS: 5.0000 mg | ORAL_TABLET | ORAL | Status: DC | PRN

## 2014-08-02 MED ORDER — METOPROLOL TARTRATE 1 MG/ML IV SOLN: 2.0000 mg | INTRAVENOUS | Status: DC | PRN

## 2014-08-02 MED ORDER — PHENOL 1.4 % MT LIQD: 1.0000 | OROMUCOSAL | Status: DC | PRN

## 2014-08-02 MED ORDER — MORPHINE SULFATE 10 MG/ML IJ SOLN: 2.0000 mg | INTRAMUSCULAR | Status: DC | PRN

## 2014-08-02 MED ORDER — MIDAZOLAM HCL 2 MG/2ML IJ SOLN
INTRAMUSCULAR | Status: AC
  Filled 2014-08-02: qty 2

## 2014-08-02 MED ORDER — HEPARIN SODIUM (PORCINE) 1000 UNIT/ML IJ SOLN
INTRAMUSCULAR | Status: AC
  Filled 2014-08-02: qty 1

## 2014-08-02 MED ORDER — ACETAMINOPHEN 325 MG PO TABS: 325.0000 mg | ORAL_TABLET | ORAL | Status: DC | PRN

## 2014-08-02 MED ORDER — FENTANYL CITRATE 0.05 MG/ML IJ SOLN
INTRAMUSCULAR | Status: AC
  Filled 2014-08-02: qty 2

## 2014-08-02 MED ORDER — ACETAMINOPHEN 325 MG RE SUPP: 325.0000 mg | RECTAL | Status: DC | PRN

## 2014-08-02 MED ORDER — SODIUM CHLORIDE 0.9 % IJ SOLN: 3.0000 mL | INTRAMUSCULAR | Status: DC | PRN

## 2014-08-02 MED ORDER — LABETALOL HCL 5 MG/ML IV SOLN: 10.0000 mg | INTRAVENOUS | Status: DC | PRN

## 2014-08-02 MED ORDER — LIDOCAINE HCL (PF) 1 % IJ SOLN
INTRAMUSCULAR | Status: AC
  Filled 2014-08-02: qty 30

## 2014-08-02 MED ORDER — HYDRALAZINE HCL 20 MG/ML IJ SOLN: 5.0000 mg | INTRAMUSCULAR | Status: DC | PRN

## 2014-08-02 MED ORDER — ALUM & MAG HYDROXIDE-SIMETH 200-200-20 MG/5ML PO SUSP: 15.0000 mL | ORAL | Status: DC | PRN

## 2014-08-02 MED ORDER — GUAIFENESIN-DM 100-10 MG/5ML PO SYRP: 15.0000 mL | ORAL_SOLUTION | ORAL | Status: DC | PRN

## 2014-08-02 MED ORDER — ONDANSETRON HCL 4 MG/2ML IJ SOLN: 4.0000 mg | Freq: Four times a day (QID) | INTRAMUSCULAR | Status: DC | PRN

## 2014-08-02 SURGICAL SUPPLY — 55 items
ADH SKN CLS APL DERMABOND .7 (GAUZE/BANDAGES/DRESSINGS) ×3
BANDAGE ELASTIC 4 VELCRO ST LF (GAUZE/BANDAGES/DRESSINGS) IMPLANT
BANDAGE ESMARK 6X9 LF (GAUZE/BANDAGES/DRESSINGS) IMPLANT
BNDG CMPR 9X6 STRL LF SNTH (GAUZE/BANDAGES/DRESSINGS)
BNDG ESMARK 6X9 LF (GAUZE/BANDAGES/DRESSINGS)
CANISTER SUCTION 2500CC (MISCELLANEOUS) ×5 IMPLANT
CLIP TI MEDIUM 24 (CLIP) ×5 IMPLANT
CLIP TI WIDE RED SMALL 24 (CLIP) ×5 IMPLANT
COVER SURGICAL LIGHT HANDLE (MISCELLANEOUS) ×5 IMPLANT
CUFF TOURNIQUET SINGLE 24IN (TOURNIQUET CUFF) IMPLANT
CUFF TOURNIQUET SINGLE 34IN LL (TOURNIQUET CUFF) IMPLANT
CUFF TOURNIQUET SINGLE 44IN (TOURNIQUET CUFF) IMPLANT
DERMABOND ADVANCED (GAUZE/BANDAGES/DRESSINGS) ×2
DERMABOND ADVANCED .7 DNX12 (GAUZE/BANDAGES/DRESSINGS) ×3 IMPLANT
DRAIN CHANNEL 15F RND FF W/TCR (WOUND CARE) IMPLANT
DRAPE WARM FLUID 44X44 (DRAPE) ×5 IMPLANT
DRAPE X-RAY CASS 24X20 (DRAPES) IMPLANT
DRSG COVADERM 4X10 (GAUZE/BANDAGES/DRESSINGS) IMPLANT
DRSG COVADERM 4X8 (GAUZE/BANDAGES/DRESSINGS) IMPLANT
ELECT REM PT RETURN 9FT ADLT (ELECTROSURGICAL) ×5
ELECTRODE REM PT RTRN 9FT ADLT (ELECTROSURGICAL) ×3 IMPLANT
EVACUATOR SILICONE 100CC (DRAIN) IMPLANT
GLOVE BIOGEL PI IND STRL 7.5 (GLOVE) ×3 IMPLANT
GLOVE BIOGEL PI INDICATOR 7.5 (GLOVE) ×2
GLOVE SURG SS PI 7.5 STRL IVOR (GLOVE) ×5 IMPLANT
GOWN PREVENTION PLUS XXLARGE (GOWN DISPOSABLE) ×5 IMPLANT
GOWN STRL NON-REIN LRG LVL3 (GOWN DISPOSABLE) ×15 IMPLANT
HEMOSTAT SNOW SURGICEL 2X4 (HEMOSTASIS) IMPLANT
KIT BASIN OR (CUSTOM PROCEDURE TRAY) ×5 IMPLANT
KIT ROOM TURNOVER OR (KITS) ×5 IMPLANT
MARKER GRAFT CORONARY BYPASS (MISCELLANEOUS) IMPLANT
NS IRRIG 1000ML POUR BTL (IV SOLUTION) ×10 IMPLANT
PACK PERIPHERAL VASCULAR (CUSTOM PROCEDURE TRAY) ×5 IMPLANT
PAD ARMBOARD 7.5X6 YLW CONV (MISCELLANEOUS) ×10 IMPLANT
PADDING CAST COTTON 6X4 STRL (CAST SUPPLIES) IMPLANT
SET COLLECT BLD 21X3/4 12 (NEEDLE) IMPLANT
STOPCOCK 4 WAY LG BORE MALE ST (IV SETS) IMPLANT
SUT ETHILON 3 0 PS 1 (SUTURE) IMPLANT
SUT PROLENE 5 0 C 1 24 (SUTURE) ×5 IMPLANT
SUT PROLENE 6 0 BV (SUTURE) ×5 IMPLANT
SUT PROLENE 7 0 BV 1 (SUTURE) IMPLANT
SUT SILK 2 0 SH (SUTURE) ×5 IMPLANT
SUT SILK 3 0 (SUTURE)
SUT SILK 3-0 18XBRD TIE 12 (SUTURE) IMPLANT
SUT VIC AB 2-0 CT1 27 (SUTURE) ×10
SUT VIC AB 2-0 CT1 TAPERPNT 27 (SUTURE) ×6 IMPLANT
SUT VIC AB 3-0 SH 27 (SUTURE) ×10
SUT VIC AB 3-0 SH 27X BRD (SUTURE) ×6 IMPLANT
SUT VICRYL 4-0 PS2 18IN ABS (SUTURE) ×10 IMPLANT
TOWEL OR 17X24 6PK STRL BLUE (TOWEL DISPOSABLE) ×10 IMPLANT
TOWEL OR 17X26 10 PK STRL BLUE (TOWEL DISPOSABLE) ×10 IMPLANT
TRAY FOLEY CATH 16FRSI W/METER (SET/KITS/TRAYS/PACK) ×5 IMPLANT
TUBING EXTENTION W/L.L. (IV SETS) IMPLANT
UNDERPAD 30X30 INCONTINENT (UNDERPADS AND DIAPERS) ×5 IMPLANT
WATER STERILE IRR 1000ML POUR (IV SOLUTION) ×5 IMPLANT

## 2014-08-02 NOTE — Telephone Encounter (Addendum)
-----   Message from Mena Goes, RN sent at 08/02/2014 10:30 AM EST ----- Regarding: Schedule   ----- Message -----    From: Serafina Mitchell, MD    Sent: 08/02/2014  10:20 AM      To: [facility] Charge Pool  11/11/20015:   Post-operative diagnosis:  Same Surgeon:  Eldridge Abrahams Procedure Performed:  1.  Ultrasound-guided access, left femoral dialysis graft  2.  Drug coated balloon angioplasty, right popliteal artery  3.  Left leg runoff    Schedule follow-up in 3 months with bilateral lower extremity duplex and ABIs.  Also he can see Vinnie Level  08/02/14: spoke with patient to schedule, dpm

## 2014-08-02 NOTE — H&P (View-Only) (Signed)
  [facility] Progress Note  07/20/2014 7:42 AM 1 Day Post-Op  Subjective:  Having intermittent pain of left foot. Same as before arteriogram. Denies chest pain and shortness of breath.   Tmax 99.1 BP sys 145/66 this am 02 98% RA  Filed Vitals:   07/20/14 0700  BP: 145/66  Pulse: 77  Temp: 98.1 F (36.7 C)  Resp: 12    Physical Exam: Incisions:  Right groin soft without hematoma.  Extremities:  Left great toe dry gangrene. Feet are warm bilaterally Cardiac: regular rate and rhythm, no murmurs Lungs: clear to auscultation bilaterally   CBC    Component Value Date/Time   WBC 6.1 07/20/2014 0111   RBC 4.13* 07/20/2014 0111   HGB 11.7* 07/20/2014 0111   HCT 35.6* 07/20/2014 0111   PLT 147* 07/20/2014 0111   MCV 86.2 07/20/2014 0111   MCH 28.3 07/20/2014 0111   MCHC 32.9 07/20/2014 0111   RDW 14.8 07/20/2014 0111   LYMPHSABS 1.1 03/23/2013 1136   MONOABS 0.8 03/23/2013 1136   EOSABS 0.1 03/23/2013 1136   BASOSABS 0.0 03/23/2013 1136    BMET    Component Value Date/Time   NA 136* 07/20/2014 0111   K 5.1 07/20/2014 0111   CL 92* 07/20/2014 0111   CO2 25 07/20/2014 0111   GLUCOSE 97 07/20/2014 0111   BUN 51* 07/20/2014 0111   CREATININE 9.97* 07/20/2014 0111   CREATININE 7.80* 01/16/2014 1437   CALCIUM 8.6 07/20/2014 0111   GFRNONAA 5* 07/20/2014 0111   GFRAA 6* 07/20/2014 0111    INR    Component Value Date/Time   INR 1.11 07/20/2014 0111     Intake/Output Summary (Last 24 hours) at 07/20/14 0742 Last data filed at 07/20/14 0000  Gross per 24 hour  Intake    480 ml  Output      0 ml  Net    480 ml     Assessment:  53 y.o. male is s/p: aortogram with bilateral LE runoff and balloon angioplasty of recurrent left popliteal artery stenosis.   Plan: -No complaints of chest pain overnight.  Elevated troponin in setting of hypertension and CKD. Normal CK-MB. Repeat ECG last evening without any ischemic changes.  Blood pressure is stable  this morning at 145/66. Will wait for last troponin this morning. Appreciate cardiology following. -ESRD: plan for dialysis today. Appreciate nephrology following. -DVT prophylaxis:  lovenox -Dispo: Patient has been asymptomatic. Will continue to observe this morning. Anticipate discharge later today after HD.    Virgina Jock, PA-C [facility] Office: [phone] Pager: [phone] 07/20/2014 7:42 AM

## 2014-08-02 NOTE — Op Note (Signed)
    Patient name: [name] MRN: [id number] DOB: [birth year] Sex: male  08/02/2014 Pre-operative Diagnosis: in-stent stenosis, right leg Post-operative diagnosis:  Same Surgeon:  Eldridge Abrahams Procedure Performed:  1.  Ultrasound-guided access, left femoral dialysis graft  2.  Drug coated balloon angioplasty, right popliteal artery  3.  Left leg runoff     Indications:  The patient has previously undergone bilateral percutaneous interventions for limb salvage.  He has developed recurrent the left leg which was treated 2 weeks ago.  This also identified a recurrence in the right leg.  Since his last intervention on the left, he is complaining of worsening pain and swelling in his left foot.  Procedure:  The patient was identified in the holding area and taken to room 8.  The patient was then placed supine on the table and prepped and draped in the usual sterile fashion.  A time out was called.  Ultrasound was used to evaluate the left dialysis graft.  Under ultrasound guidance, a micropuncture needle was used to cannulate the arterial limb of the thigh graft.an 018 wire was advanced without resistance followed by a micropuncture sheath.  A Benson wire was inserted followed by 5 [place] sheath.  Using a Omni flush catheter and a Benson wire, the aortic bifurcation was crossed and the catheter was placed in the right lower extremity and right leg runoff was obtained. Findings:   Aortogram:  Not evaluated  Right Lower Extremity:  Calcified plaque within the right common femoral artery.  The superficial femoral and popliteal artery are patent.  There is a recurrence of the disease within the popliteal artery behind the knee, this corresponds to the previously treated area and is approximately 80% Stenotic.  Diffuse tibial disease is present  Left Lower Extremity:  The area treated 2 weeks ago within the popliteal artery remains widely patent.  Intervention:  A 6 French 55 cm sheath was  advanced to the right superficial femoral artery.  The patient was fully heparinized.  Using a 014 wire and a chocolate balloon, 4 x 80, the lesion was crossed.  Primary balloon angioplasty was performed taking the balloon to nominal pressure and held for 3 minutes.  The balloon was then deflated and withdrawn.  I then used a drug coated balloon, Lutonix 4 x 80.  This was taken to nominal pressure for 3 minutes.  Completion angiography revealed resolution of the stenosis within the popliteal artery.  Catheters and wires were removed.  Suture closure of the cannulation site was performed within the left thigh graft.  There were no immediate complications.  Impression:  #1  Successful drug coated balloon angioplasty of the right popliteal artery using a 4 x 80 balloon  #2  Widely patent intervention from 2 weeks ago on the left leg    V. [name], M.D. [facility] [place] Office: [phone] Pager:  [phone]

## 2014-08-02 NOTE — Discharge Instructions (Signed)

## 2014-08-02 NOTE — Interval H&P Note (Signed)
History and Physical Interval Note:  08/02/2014 8:26 AM  [name]  has presented today for surgery, with the diagnosis of right popliteal artery stenosis  The various methods of treatment have been discussed with the patient and family. After consideration of risks, benefits and other options for treatment, the patient has consented to  Procedure(s): ABDOMINAL AORTAGRAM (N/A) as a surgical intervention .  The patient's history has been reviewed, patient examined, no change in status, stable for surgery.  I have reviewed the patient's chart and labs.  Questions were answered to the patient's satisfaction.     [name], V. WELLS  Back for treatment of right leg.  [name]

## 2014-08-07 ENCOUNTER — Ambulatory Visit (INDEPENDENT_AMBULATORY_CARE_PROVIDER_SITE_OTHER): Payer: Medicare Other | Admitting: Surgery

## 2014-08-07 ENCOUNTER — Other Ambulatory Visit: Payer: Self-pay | Admitting: Surgery

## 2014-08-07 ENCOUNTER — Encounter: Payer: Self-pay | Admitting: Surgery

## 2014-08-07 ENCOUNTER — Ambulatory Visit (HOSPITAL_COMMUNITY)
Admission: RE | Admit: 2014-08-07 | Discharge: 2014-08-07 | Disposition: A | Discharge status: Home / Self Care | Payer: Medicare Other | Source: Ambulatory Visit | Attending: Family | Admitting: Family

## 2014-08-07 ENCOUNTER — Ambulatory Visit (INDEPENDENT_AMBULATORY_CARE_PROVIDER_SITE_OTHER)
Admission: RE | Admit: 2014-08-07 | Discharge: 2014-08-07 | Disposition: A | Discharge status: Home / Self Care | Payer: Medicare Other | Source: Ambulatory Visit | Attending: Family | Admitting: Family

## 2014-08-07 ENCOUNTER — Telehealth: Payer: Self-pay

## 2014-08-07 VITALS — BP 149/73 | HR 92 | Resp 16 | Ht 71.0 in | Wt 108.0 lb

## 2014-08-07 DIAGNOSIS — G8918 Other acute postprocedural pain: Secondary | ICD-10-CM

## 2014-08-07 DIAGNOSIS — Z48812 Encounter for surgical aftercare following surgery on the circulatory system: Secondary | ICD-10-CM | POA: Diagnosis not present

## 2014-08-07 DIAGNOSIS — I739 Peripheral vascular disease, unspecified: Secondary | ICD-10-CM

## 2014-08-07 DIAGNOSIS — I70219 Atherosclerosis of native arteries of extremities with intermittent claudication, unspecified extremity: Secondary | ICD-10-CM

## 2014-08-07 DIAGNOSIS — M79604 Pain in right leg: Secondary | ICD-10-CM

## 2014-08-07 DIAGNOSIS — Z9862 Peripheral vascular angioplasty status: Secondary | ICD-10-CM

## 2014-08-07 MED ORDER — HYDROMORPHONE HCL 2 MG PO TABS: ORAL_TABLET | ORAL | Status: DC

## 2014-08-07 NOTE — Addendum Note (Signed)
Addended by: Mena Goes on: 08/07/2014 05:09 PM   Modules accepted: Orders

## 2014-08-07 NOTE — Telephone Encounter (Signed)
Pt. Called with c/o pain in the right calf, shin and foot.  Reported "the pain started on Thursday or Friday last week."  Denies swelling of the right foot/ lower extremity.  Stated "the pain feels like a cramp"; describes a "tingling, burning and aching," that starts up and lasts awhile.  Stated "I can hardly walk."  Discussed with Dr. Trula Slade.  Recommended to have the pt. come in for ABI's and (R) lower extremity arterial duplex today.  Pt. Given appt. At 2:30 PM.  Agrees with plan.

## 2014-08-07 NOTE — Progress Notes (Signed)
Patient name: [name] MRN: [id number] DOB: 12-15-1960 Sex: male     Chief Complaint  Patient presents with  . Re-evaluation    right foot pain and right calf pain  . PVD    HISTORY OF PRESENT ILLNESS: The patient is back today for followup. He initially presented with bilateral lower extremity ulcers on both great toes. On 01/03/2014, he underwent atherectomy and angioplasty of the left popliteal artery. On 01/11/2014, he underwent atherectomy with drug coded balloon angioplasty of the right popliteal artery as well as angioplasty of the right dorsalis pedis artery. After each procedure the patient developed chest pain. He ultimately underwent coronary stenting. I have been following him for bilateral wound on his feet. 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 He was able to heal areas at the base of his bilateral great toes.   He still has dry gangrene to the left great toe. All other ulcers have healed.  The patient underwent percutaneous intervention on the right leg last week.  He is complaining of stabbing pain in his right foot as well as cramping in his calf today.  Past Medical History  Diagnosis Date  . ESRD on hemodialysis     a. ESRD 2/2 HTN with renal transplant in 1987 (cadaveric) after short period of dialysis;  b. Transplant failed in 2004 and he went back on HD;  c. As of 10/15 getting HD via L thigh AVG on a TTS schedule at [facility].  . Hypertension   . Hx of kidney transplant     a. 1987-> back on HD since 2004  . Gout tophi   . Chronic steroid use     a. Has severe gout. Did not tolerate Allopurinol. Do not taper per PCP   . Systolic CHF, chronic     2 D echo 04/2012 with EF of 45 %   . Malnutrition   . Hx SBO 04/2012    a. 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 F/U with Dr Donne Hazel. Started on TNA  . Anemia  associated with chronic renal failure   . Hepatitis C   . GERD (gastroesophageal reflux disease)   . H/O hiatal hernia   . Arthritis   . History of DVT (deep vein thrombosis)   . Peripheral vascular disease     a. 12/2013 PTA of L Pop;  b. 12/2013 PTA R pop, R DP;  c. 06/2014 L Pop CBA/DCB PTA.  . Prostate cancer   . CAD (coronary artery disease)     a. 12/2013 Cath/PCI: EF 45-50%, LM Ca2+, LAD 30-40p, 7m, D1/2/3 min irregs, LCX 50ost, 30-68m, RCA 70p, 95/32m (Rota->3.0x23 Xience distal, 3.0x23 Xience mid, 3.0x28 Xience prox).  . Chronic diastolic CHF (congestive heart failure)     a. 12/2013 Echo: EF 55-60%, mild LVH, nl wall motion, Gr 2 DD.  . Dry gangrene     a. L great toe    Past Surgical History  Procedure Laterality Date  . Laparotomy  04/14/2012    Procedure: EXPLORATORY LAPAROTOMY;  Surgeon: Joyice Faster. Cornett, MD;  Location: [place];  Service: General;  Laterality: N/A;  . Colon resection  04/14/2012  . Laparotomy  04/22/2012    Procedure: EXPLORATORY LAPAROTOMY;  Surgeon: Adin Hector, MD;  Location: [place];  Service: General;  Laterality: N/A;  lysis of adhesions, omentoplasty, repair small bowel  . Thrombectomy w/ embolectomy  04/27/2012    Procedure: THROMBECTOMY ARTERIOVENOUS GORE-TEX GRAFT;  Surgeon: Angelia Mould, MD;  Location: [place];  Service: Vascular;  Laterality: Left;  Thrombectomy of left thigh arteriovenous gortex graft  . Prostectomy  2011  . Renal grafts    . Pelvic abcess drainage Right 6/14    removal drain s/p bowl resection 13  . Transanal excision of rectal mass N/A 03/30/2013    Procedure: EXCISION OF anal MASS;  Surgeon: Joyice Faster. Cornett, MD;  Location: [place];  Service: General;  Laterality: N/A;  Exam under anesthesia with excision anal verge mass    History   Social History  . Marital Status: Single    Spouse Name: N/A    Number of Children: N/A  . Years of Education: N/A   Occupational History  . Not on file.   Social History Main Topics   . Smoking status: Former Smoker -- 1.00 packs/day for 10 years    Types: Cigarettes    Quit date: 03/23/1973  . Smokeless tobacco: Never Used     Comment: Quit 30 years ago  . Alcohol Use: No  . Drug Use: No  . Sexual Activity: No   Other Topics Concern  . Not on file   Social History Narrative   Currently in the nursing home until end of the month then will go to live with sister.           Family History  Problem Relation Age of Onset  . Hypertension Father   . Pneumonia Brother   . Lung disease Brother     Allergies as of 08/07/2014 - Review Complete 08/07/2014  Allergen Reaction Noted  . Allopurinol Other (See Comments) 02/27/2010    Current Outpatient Prescriptions on File Prior to Visit  Medication Sig Dispense Refill  . aspirin EC 81 MG tablet Take 81 mg by mouth daily.    Marland Kitchen atorvastatin (LIPITOR) 40 MG tablet Take 1 tablet (40 mg total) by mouth daily at 6 PM. 30 tablet 6  . calcium acetate (PHOSLO) 667 MG tablet Take 2,001 mg by mouth 3 (three) times daily.     . carvedilol (COREG) 6.25 MG tablet Take 1 tablet (6.25 mg total) by mouth 2 (two) times daily with a meal. 60 tablet 6  . cinacalcet (SENSIPAR) 60 MG tablet Take 60 mg by mouth daily.    . clopidogrel (PLAVIX) 75 MG tablet Take 1 tablet (75 mg total) by mouth daily with breakfast. 30 tablet 10  . COLCRYS 0.6 MG tablet Take 0.6 mg by mouth daily.     Marland Kitchen LIDOCAINE EX Apply 1 application topically See admin instructions. Apply to inject site on dialysis days (Tues, Thur, Sat) if needed for pain    . loperamide (IMODIUM A-D) 2 MG tablet Take 2 mg by mouth daily as needed for diarrhea or loose stools.     . multivitamin (RENA-VIT) TABS tablet Take 1 tablet by mouth daily. 30 tablet 0  . nitroGLYCERIN (NITRODUR - DOSED IN MG/24 HR) 0.3 mg/hr patch Place 1 patch (0.3 mg total) onto the skin daily. 30 patch 12  . oxyCODONE (OXY IR/ROXICODONE) 5 MG immediate release tablet Take 5 mg by mouth every 6 (six) hours as  needed for severe pain.    . pantoprazole (PROTONIX) 40 MG tablet Take 1 tablet (40 mg total) by mouth daily. 30 tablet 6  . predniSONE (DELTASONE) 5 MG tablet Take  5 mg by mouth daily.    Marland Kitchen oxyCODONE (ROXICODONE) 5 MG immediate release tablet Take 1 tablet (5 mg total) by mouth every 4 (four) hours as needed for severe pain. 30 tablet 0   Current Facility-Administered Medications on File Prior to Visit  Medication Dose Route Frequency Provider Last Rate Last Dose  . fentaNYL (SUBLIMAZE) injection   Intravenous PRN Carylon Perches, MD   25 mcg at 05/05/12 1544  . midazolam (VERSED) 5 MG/5ML injection   Intravenous PRN Carylon Perches, MD   2 mg at 05/05/12 1544     REVIEW OF SYSTEMS: See history of present illness, otherwise negative  PHYSICAL EXAMINATION:   Vital signs are BP 149/73 mmHg  Pulse 92  Resp 16  Ht 5\' 11"  (1.803 m)  Wt 108 lb (48.988 kg)  BMI 15.07 kg/m2 General: The patient appears their stated age. HEENT:  No gross abnormalities Pulmonary:  Non labored breathing Abdomen: Soft and non-tender Musculoskeletal: There are no major deformities. Neurologic: normal motor and sensory function to the right foot Skin: Dry gangrene, left great toe Psychiatric: The patient has normal affect. Cardiovascular: There is a regular rate and rhythm without significant murmur appreciated.   Diagnostic Studies Ultrasound today shows a patent popliteal artery at the level of the intervention.  The tibioperoneal trunk appears to be occluded  Assessment: Peripheral vascular disease, status post bilateral intervention Plan: Ultrasound today shows a tibioperoneal trunk occlusion which is a new finding.  The anterior tibial artery remains widely patent as a single vessel runoff down to the foot.  I have reviewed the angiographic images.  He should have adequate blood flow from the anterior tibial artery to prevent any major issues.  I am giving him some Dilaudid for pain control.  He will  follow-up in 3 months  V. Leia Alf, M.D. [facility] Office: [phone] Pager:  [phone]

## 2014-08-12 ENCOUNTER — Other Ambulatory Visit: Payer: Self-pay | Admitting: Adult Health

## 2014-08-13 ENCOUNTER — Emergency Department (HOSPITAL_COMMUNITY)
Admission: EM | Admit: 2014-08-13 | Discharge: 2014-08-13 | Disposition: A | Discharge status: Home / Self Care | Payer: Medicare Other | Attending: Emergency Medicine | Admitting: Emergency Medicine

## 2014-08-13 ENCOUNTER — Encounter (HOSPITAL_COMMUNITY): Payer: Self-pay | Admitting: Family Medicine

## 2014-08-13 DIAGNOSIS — Z8619 Personal history of other infectious and parasitic diseases: Secondary | ICD-10-CM | POA: Insufficient documentation

## 2014-08-13 DIAGNOSIS — Z79899 Other long term (current) drug therapy: Secondary | ICD-10-CM | POA: Insufficient documentation

## 2014-08-13 DIAGNOSIS — K219 Gastro-esophageal reflux disease without esophagitis: Secondary | ICD-10-CM | POA: Insufficient documentation

## 2014-08-13 DIAGNOSIS — M79661 Pain in right lower leg: Secondary | ICD-10-CM | POA: Insufficient documentation

## 2014-08-13 DIAGNOSIS — I739 Peripheral vascular disease, unspecified: Secondary | ICD-10-CM

## 2014-08-13 DIAGNOSIS — Z992 Dependence on renal dialysis: Secondary | ICD-10-CM | POA: Insufficient documentation

## 2014-08-13 DIAGNOSIS — I5042 Chronic combined systolic (congestive) and diastolic (congestive) heart failure: Secondary | ICD-10-CM | POA: Insufficient documentation

## 2014-08-13 DIAGNOSIS — M1A9XX1 Chronic gout, unspecified, with tophus (tophi): Secondary | ICD-10-CM | POA: Insufficient documentation

## 2014-08-13 DIAGNOSIS — Z8639 Personal history of other endocrine, nutritional and metabolic disease: Secondary | ICD-10-CM | POA: Insufficient documentation

## 2014-08-13 DIAGNOSIS — I120 Hypertensive chronic kidney disease with stage 5 chronic kidney disease or end stage renal disease: Secondary | ICD-10-CM | POA: Insufficient documentation

## 2014-08-13 DIAGNOSIS — Z940 Kidney transplant status: Secondary | ICD-10-CM | POA: Insufficient documentation

## 2014-08-13 DIAGNOSIS — N186 End stage renal disease: Secondary | ICD-10-CM | POA: Insufficient documentation

## 2014-08-13 DIAGNOSIS — M1990 Unspecified osteoarthritis, unspecified site: Secondary | ICD-10-CM | POA: Insufficient documentation

## 2014-08-13 DIAGNOSIS — Z86718 Personal history of other venous thrombosis and embolism: Secondary | ICD-10-CM | POA: Insufficient documentation

## 2014-08-13 DIAGNOSIS — I2510 Atherosclerotic heart disease of native coronary artery without angina pectoris: Secondary | ICD-10-CM | POA: Insufficient documentation

## 2014-08-13 DIAGNOSIS — Z8546 Personal history of malignant neoplasm of prostate: Secondary | ICD-10-CM | POA: Insufficient documentation

## 2014-08-13 DIAGNOSIS — I7025 Atherosclerosis of native arteries of other extremities with ulceration: Secondary | ICD-10-CM

## 2014-08-13 DIAGNOSIS — Z87891 Personal history of nicotine dependence: Secondary | ICD-10-CM | POA: Insufficient documentation

## 2014-08-13 DIAGNOSIS — M79604 Pain in right leg: Secondary | ICD-10-CM

## 2014-08-13 DIAGNOSIS — M79609 Pain in unspecified limb: Secondary | ICD-10-CM

## 2014-08-13 DIAGNOSIS — Z7952 Long term (current) use of systemic steroids: Secondary | ICD-10-CM | POA: Insufficient documentation

## 2014-08-13 DIAGNOSIS — Z7982 Long term (current) use of aspirin: Secondary | ICD-10-CM | POA: Insufficient documentation

## 2014-08-13 LAB — CBC
HEMATOCRIT: 37.3 % — AB (ref 39.0–52.0)
HEMOGLOBIN: 11.5 g/dL — AB (ref 13.0–17.0)
MCH: 28.1 pg (ref 26.0–34.0)
MCHC: 30.8 g/dL (ref 30.0–36.0)
MCV: 91.2 fL (ref 78.0–100.0)
Platelets: 202 10*3/uL (ref 150–400)
RBC: 4.09 MIL/uL — ABNORMAL LOW (ref 4.22–5.81)
RDW: 15.9 % — ABNORMAL HIGH (ref 11.5–15.5)
WBC: 7.2 10*3/uL (ref 4.0–10.5)

## 2014-08-13 LAB — BASIC METABOLIC PANEL
Anion gap: 18 — ABNORMAL HIGH (ref 5–15)
BUN: 32 mg/dL — AB (ref 6–23)
CO2: 30 mEq/L (ref 19–32)
Calcium: 8.8 mg/dL (ref 8.4–10.5)
Chloride: 96 mEq/L (ref 96–112)
Creatinine, Ser: 6.46 mg/dL — ABNORMAL HIGH (ref 0.50–1.35)
GFR calc Af Amer: 10 mL/min — ABNORMAL LOW (ref >90)
GFR calc non Af Amer: 9 mL/min — ABNORMAL LOW (ref >90)
GLUCOSE: 84 mg/dL (ref 70–99)
POTASSIUM: 3.9 meq/L (ref 3.7–5.3)
Sodium: 144 mEq/L (ref 137–147)

## 2014-08-13 MED ORDER — HYDROMORPHONE HCL 1 MG/ML IJ SOLN
1.0000 mg | Freq: Once | INTRAMUSCULAR | Status: AC
  Administered 2014-08-13: 1 mg via INTRAMUSCULAR
  Filled 2014-08-13: qty 1

## 2014-08-13 NOTE — ED Notes (Signed)
Pt has returned from being out of the department; pt placed back on continuous pulse oximetry and blood pressure cuff; family at bedside 

## 2014-08-13 NOTE — Discharge Instructions (Signed)
Return to the ED with any concerns including increased pain, swelling of leg, decreased level of alertness/lethargy, or any other alarming symptom

## 2014-08-13 NOTE — ED Notes (Signed)
Per pt sts right leg and foot pain x 1 week and worse. sts circulation problems in that leg.

## 2014-08-13 NOTE — ED Provider Notes (Signed)
CSN: [id number]     Arrival date & time 08/13/14  1010 History   First MD Initiated Contact with Patient 08/13/14 1018     Chief Complaint  Patient presents with  . Foot Pain  . Leg Pain     (Consider location/radiation/quality/duration/timing/severity/associated sxs/prior Treatment) HPI  Pt presenting with c/o pain in bottom of right foot.  Pt states he knows that he has poor blood flow to his legs and feet.  He was seen by vascular surgeon approx 1 week ago and had duplex doppler performed- which showed reasonably good flow from anterior tibial artery.  He was given dilaudid tablets for pain medication.  He states he took 1 tab at 8am but pain is still present and somewhat worse.  No swelling of lower extremities.  No chest pain or shortness of breath.  Pain is underneath toes and bottom of foot.  There are no other associated systemic symptoms, there are no other alleviating or modifying factors.   Past Medical History  Diagnosis Date  . ESRD on hemodialysis     a. ESRD 2/2 HTN with renal transplant in 1987 (cadaveric) after short period of dialysis;  b. Transplant failed in 2004 and he went back on HD;  c. As of 10/15 getting HD via L thigh AVG on a TTS schedule at [facility] on [facility].  . Hypertension   . Hx of kidney transplant     a. 1987-> back on HD since 2004  . Gout tophi   . Chronic steroid use     a. Has severe gout. Did not tolerate Allopurinol. Do not taper per PCP   . Systolic CHF, chronic     2 D echo 04/2012 with EF of 45 %   . Malnutrition   . Hx SBO 04/2012    a. 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 F/U with Dr Donne Hazel. Started on TNA  . Anemia associated with chronic renal failure   . Hepatitis C   . GERD (gastroesophageal reflux disease)   . H/O hiatal hernia   . Arthritis   . History of DVT (deep vein thrombosis)   . Peripheral vascular disease     a. 12/2013 PTA of L Pop;  b.  12/2013 PTA R pop, R DP;  c. 06/2014 L Pop CBA/DCB PTA.  . Prostate cancer   . CAD (coronary artery disease)     a. 12/2013 Cath/PCI: EF 45-50%, LM Ca2+, LAD 30-40p, 66m, D1/2/3 min irregs, LCX 50ost, 30-22m, RCA 70p, 95/5m (Rota->3.0x23 Xience distal, 3.0x23 Xience mid, 3.0x28 Xience prox).  . Chronic diastolic CHF (congestive heart failure)     a. 12/2013 Echo: EF 55-60%, mild LVH, nl wall motion, Gr 2 DD.  . Dry gangrene     a. L great toe   Past Surgical History  Procedure Laterality Date  . Laparotomy  04/14/2012    Procedure: EXPLORATORY LAPAROTOMY;  Surgeon: Joyice Faster. Cornett, MD;  Location: [place];  Service: General;  Laterality: N/A;  . Colon resection  04/14/2012  . Laparotomy  04/22/2012    Procedure: EXPLORATORY LAPAROTOMY;  Surgeon: Adin Hector, MD;  Location: [place];  Service: General;  Laterality: N/A;  lysis of adhesions, omentoplasty, repair small bowel  . Thrombectomy w/ embolectomy  04/27/2012    Procedure: THROMBECTOMY ARTERIOVENOUS GORE-TEX GRAFT;  Surgeon: Angelia Mould, MD;  Location: [place];  Service: Vascular;  Laterality: Left;  Thrombectomy of left thigh arteriovenous gortex graft  . Prostectomy  2011  . Renal grafts    . Pelvic abcess drainage Right 6/14    removal drain s/p bowl resection 13  . Transanal excision of rectal mass N/A 03/30/2013    Procedure: EXCISION OF anal MASS;  Surgeon: Joyice Faster. Cornett, MD;  Location: [place];  Service: General;  Laterality: N/A;  Exam under anesthesia with excision anal verge mass   Family History  Problem Relation Age of Onset  . Hypertension Father   . Pneumonia Brother   . Lung disease Brother    History  Substance Use Topics  . Smoking status: Former Smoker -- 1.00 packs/day for 10 years    Types: Cigarettes    Quit date: 03/23/1973  . Smokeless tobacco: Never Used     Comment: Quit 30 years ago  . Alcohol Use: No    Review of Systems  ROS reviewed and all otherwise negative except for mentioned in  HPI    Allergies  Allopurinol  Home Medications   Prior to Admission medications   Medication Sig Start Date End Date Taking? Authorizing Provider  amLODipine (NORVASC) 5 MG tablet Take 5 mg by mouth daily.  07/26/14  Yes Historical Provider, MD  aspirin EC 81 MG tablet Take 81 mg by mouth daily.   Yes Historical Provider, MD  atorvastatin (LIPITOR) 40 MG tablet Take 1 tablet (40 mg total) by mouth daily at 6 PM. 01/14/14  Yes Lendon Colonel, NP  calcium acetate (PHOSLO) 667 MG tablet Take 2,001 mg by mouth 3 (three) times daily.  01/24/13  Yes Historical Provider, MD  carvedilol (COREG) 6.25 MG tablet Take 1 tablet (6.25 mg total) by mouth 2 (two) times daily with a meal. 01/14/14  Yes Lendon Colonel, NP  cinacalcet (SENSIPAR) 60 MG tablet Take 60 mg by mouth daily.   Yes Historical Provider, MD  clopidogrel (PLAVIX) 75 MG tablet Take 1 tablet (75 mg total) by mouth daily with breakfast. 01/14/14  Yes Lendon Colonel, NP  COLCRYS 0.6 MG tablet Take 0.6 mg by mouth daily.  01/04/13  Yes Historical Provider, MD  HYDROmorphone (DILAUDID) 2 MG tablet Take 1 to 2 tablets by mouth every 6 hours as needed for postoperative pain. Patient taking differently: Take 1-2 mg by mouth every 6 (six) hours as needed (pain).  08/07/14  Yes Serafina Mitchell, MD  LIDOCAINE EX Apply 1 application topically See admin instructions. Apply to inject site on dialysis days (Tues, Thur, Sat) if needed for pain   Yes Historical Provider, MD  loperamide (IMODIUM A-D) 2 MG tablet Take 2 mg by mouth daily as needed for diarrhea or loose stools.  08/13/12  Yes Erroll Luna, MD  multivitamin (RENA-VIT) TABS tablet Take 1 tablet by mouth daily. 07/05/12  Yes Rosalia Hammers, MD  nitroGLYCERIN (NITRODUR - DOSED IN MG/24 HR) 0.3 mg/hr patch Place 1 patch (0.3 mg total) onto the skin daily. 01/14/14  Yes Lendon Colonel, NP  oxyCODONE (ROXICODONE) 5 MG immediate release tablet Take 1 tablet (5 mg total) by mouth every 4  (four) hours as needed for severe pain. 08/02/14  Yes Serafina Mitchell, MD  pantoprazole (PROTONIX) 40 MG tablet Take 1 tablet (40 mg total) by mouth daily. 01/14/14  Yes Lendon Colonel, NP  predniSONE (DELTASONE) 5 MG tablet Take 5 mg by mouth daily.   Yes Historical Provider, MD   BP 140/30 mmHg  Pulse 66  Temp(Src) 97.3  F (36.3 C)  Resp 18  SpO2 96%  Vitals reviewed Physical Exam  Physical Examination: General appearance - alert, well appearing, and in no distress Mental status - alert, oriented to person, place, and time Eyes - no conjunctival injection, no scleral icterus Mouth - mucous membranes moist, pharynx normal without lesions Chest - clear to auscultation, no wheezes, rales or rhonchi, symmetric air entry Heart - normal rate, regular rhythm, normal S1, S2, no murmurs, rubs, clicks or gallops Abdomen - soft, nontender, nondistended, no masses or organomegaly Extremities - peripheral pulses normal, no pedal edema, no clubbing or cyanosis, foot warm- symmetric with opposite foot Skin - normal coloration and turgor, no rashes  ED Course  Procedures (including critical care time) Labs Review Labs Reviewed  CBC - Abnormal; Notable for the following:    RBC 4.09 (*)    Hemoglobin 11.5 (*)    HCT 37.3 (*)    RDW 15.9 (*)    All other components within normal limits  BASIC METABOLIC PANEL - Abnormal; Notable for the following:    BUN 32 (*)    Creatinine, Ser 6.46 (*)    GFR calc non Af Amer 9 (*)    GFR calc Af Amer 10 (*)    Anion gap 18 (*)    All other components within normal limits    Imaging Review No results found.   EKG Interpretation None      MDM   Final diagnoses:  Pain of right lower extremity    Pt presenting with c/o ongoing pain in right lower extremity, he has known arterial insufficiency, no significant change from prior arterial duplex performed approx 1 week ago.  Pt given IM dilaudid, he has po dilaudid to take at home.  Discharged  with strict return precautions.  Pt agreeable with plan.    Threasa Beards, MD 08/13/14 [phone]

## 2014-08-13 NOTE — Progress Notes (Signed)
VASCULAR LAB PRELIMINARY  PRELIMINARY  PRELIMINARY  PRELIMINARY  Right lower extremity arterial Duplex completed.    Preliminary report:  There is significant plaque throughout the right lower extremity.  Near occlusion noted in the right SFA. Dampened monophasic flow noted in the right popliteal artery.  No flow noted in the posterior tibial artery.  Dampened flow noted in the right anterior tibial artery.  There is no significant change compared to study done at [facility] 08/07/14.  Jaylyn Iyer, RVT 08/13/2014, 12:07 PM

## 2014-08-13 NOTE — ED Notes (Signed)
Patient transported to vascular. 

## 2014-08-14 ENCOUNTER — Other Ambulatory Visit: Payer: Self-pay

## 2014-08-14 ENCOUNTER — Other Ambulatory Visit (HOSPITAL_COMMUNITY): Payer: Medicare Other

## 2014-08-14 ENCOUNTER — Encounter (HOSPITAL_COMMUNITY): Payer: Medicare Other

## 2014-08-14 ENCOUNTER — Telehealth: Payer: Self-pay

## 2014-08-14 ENCOUNTER — Ambulatory Visit: Payer: Medicare Other | Admitting: Surgery

## 2014-08-14 NOTE — Telephone Encounter (Signed)
Rx was sent to pharmacy electronically. 

## 2014-08-14 NOTE — Telephone Encounter (Signed)
Phone call from pt.  Reported he continues to have pain on top of right foot on the sole, and in the toes.  Stated the pain moves around.  Denies any change in color, but stated there is a coolness of the right foot. Reported it is very painful to move, and that he can hardly walk.  Reported that he is getting some relief from the Dilaudid for a short time.  Stated he was in the ER last night for the pain.  Discussed with Dr. Trula Slade.  Recommended to schedule for a right lower extremity angiogram tomorrow by one of the other MD's, since he is not available tomorrow.  Scheduled for right lower extremity angiogram 11/24 with Dr. Bridgett Larsson.  Pt. Notified of the above recommendation.  Gave verbal instructions over the phone, and advised to arrive by 7:00 AM to the Admitting Dept. @ [facility].  Verb. Understanding. Agrees with plan.

## 2014-08-15 ENCOUNTER — Ambulatory Visit (HOSPITAL_COMMUNITY)
Admission: RE | Admit: 2014-08-15 | Discharge: 2014-08-15 | Disposition: A | Discharge status: Home / Self Care | Payer: Medicare Other | Source: Ambulatory Visit | Attending: Vascular Surgery | Admitting: Vascular Surgery

## 2014-08-15 ENCOUNTER — Encounter (HOSPITAL_COMMUNITY)
Admission: RE | Disposition: A | Discharge status: Home / Self Care | Payer: Self-pay | Source: Ambulatory Visit | Attending: Vascular Surgery

## 2014-08-15 ENCOUNTER — Telehealth: Payer: Self-pay | Admitting: Surgery

## 2014-08-15 DIAGNOSIS — Z992 Dependence on renal dialysis: Secondary | ICD-10-CM

## 2014-08-15 DIAGNOSIS — M1A9XX1 Chronic gout, unspecified, with tophus (tophi): Secondary | ICD-10-CM | POA: Diagnosis present

## 2014-08-15 DIAGNOSIS — Z955 Presence of coronary angioplasty implant and graft: Secondary | ICD-10-CM | POA: Insufficient documentation

## 2014-08-15 DIAGNOSIS — Z7952 Long term (current) use of systemic steroids: Secondary | ICD-10-CM

## 2014-08-15 DIAGNOSIS — Z79899 Other long term (current) drug therapy: Secondary | ICD-10-CM | POA: Insufficient documentation

## 2014-08-15 DIAGNOSIS — Z681 Body mass index (BMI) 19 or less, adult: Secondary | ICD-10-CM

## 2014-08-15 DIAGNOSIS — Z7902 Long term (current) use of antithrombotics/antiplatelets: Secondary | ICD-10-CM | POA: Insufficient documentation

## 2014-08-15 DIAGNOSIS — K219 Gastro-esophageal reflux disease without esophagitis: Secondary | ICD-10-CM

## 2014-08-15 DIAGNOSIS — B182 Chronic viral hepatitis C: Secondary | ICD-10-CM | POA: Diagnosis present

## 2014-08-15 DIAGNOSIS — Z79891 Long term (current) use of opiate analgesic: Secondary | ICD-10-CM

## 2014-08-15 DIAGNOSIS — Z7982 Long term (current) use of aspirin: Secondary | ICD-10-CM | POA: Insufficient documentation

## 2014-08-15 DIAGNOSIS — Z940 Kidney transplant status: Secondary | ICD-10-CM

## 2014-08-15 DIAGNOSIS — Z87891 Personal history of nicotine dependence: Secondary | ICD-10-CM

## 2014-08-15 DIAGNOSIS — N2581 Secondary hyperparathyroidism of renal origin: Secondary | ICD-10-CM | POA: Diagnosis present

## 2014-08-15 DIAGNOSIS — I771 Stricture of artery: Secondary | ICD-10-CM | POA: Diagnosis present

## 2014-08-15 DIAGNOSIS — Z8546 Personal history of malignant neoplasm of prostate: Secondary | ICD-10-CM

## 2014-08-15 DIAGNOSIS — K559 Vascular disorder of intestine, unspecified: Secondary | ICD-10-CM | POA: Diagnosis not present

## 2014-08-15 DIAGNOSIS — I120 Hypertensive chronic kidney disease with stage 5 chronic kidney disease or end stage renal disease: Secondary | ICD-10-CM

## 2014-08-15 DIAGNOSIS — I5042 Chronic combined systolic (congestive) and diastolic (congestive) heart failure: Secondary | ICD-10-CM

## 2014-08-15 DIAGNOSIS — R578 Other shock: Secondary | ICD-10-CM | POA: Diagnosis not present

## 2014-08-15 DIAGNOSIS — I5022 Chronic systolic (congestive) heart failure: Secondary | ICD-10-CM | POA: Diagnosis present

## 2014-08-15 DIAGNOSIS — N186 End stage renal disease: Secondary | ICD-10-CM

## 2014-08-15 DIAGNOSIS — I5032 Chronic diastolic (congestive) heart failure: Secondary | ICD-10-CM | POA: Diagnosis present

## 2014-08-15 DIAGNOSIS — I96 Gangrene, not elsewhere classified: Secondary | ICD-10-CM | POA: Diagnosis present

## 2014-08-15 DIAGNOSIS — R64 Cachexia: Secondary | ICD-10-CM | POA: Diagnosis present

## 2014-08-15 DIAGNOSIS — I82412 Acute embolism and thrombosis of left femoral vein: Secondary | ICD-10-CM | POA: Diagnosis present

## 2014-08-15 DIAGNOSIS — I7092 Chronic total occlusion of artery of the extremities: Secondary | ICD-10-CM | POA: Diagnosis present

## 2014-08-15 DIAGNOSIS — K5900 Constipation, unspecified: Secondary | ICD-10-CM | POA: Diagnosis present

## 2014-08-15 DIAGNOSIS — I70221 Atherosclerosis of native arteries of extremities with rest pain, right leg: Secondary | ICD-10-CM

## 2014-08-15 DIAGNOSIS — I2510 Atherosclerotic heart disease of native coronary artery without angina pectoris: Secondary | ICD-10-CM

## 2014-08-15 DIAGNOSIS — D62 Acute posthemorrhagic anemia: Secondary | ICD-10-CM | POA: Diagnosis not present

## 2014-08-15 DIAGNOSIS — I70262 Atherosclerosis of native arteries of extremities with gangrene, left leg: Secondary | ICD-10-CM | POA: Insufficient documentation

## 2014-08-15 DIAGNOSIS — E43 Unspecified severe protein-calorie malnutrition: Secondary | ICD-10-CM | POA: Diagnosis present

## 2014-08-15 DIAGNOSIS — D631 Anemia in chronic kidney disease: Secondary | ICD-10-CM

## 2014-08-15 DIAGNOSIS — F419 Anxiety disorder, unspecified: Secondary | ICD-10-CM | POA: Diagnosis not present

## 2014-08-15 DIAGNOSIS — I739 Peripheral vascular disease, unspecified: Principal | ICD-10-CM | POA: Diagnosis present

## 2014-08-15 DIAGNOSIS — R791 Abnormal coagulation profile: Secondary | ICD-10-CM | POA: Diagnosis not present

## 2014-08-15 DIAGNOSIS — I959 Hypotension, unspecified: Secondary | ICD-10-CM | POA: Diagnosis not present

## 2014-08-15 DIAGNOSIS — E162 Hypoglycemia, unspecified: Secondary | ICD-10-CM | POA: Diagnosis not present

## 2014-08-15 DIAGNOSIS — Z86718 Personal history of other venous thrombosis and embolism: Secondary | ICD-10-CM

## 2014-08-15 DIAGNOSIS — G8929 Other chronic pain: Secondary | ICD-10-CM | POA: Diagnosis present

## 2014-08-15 HISTORY — PX: AORTOGRAM: SHX6300

## 2014-08-15 LAB — POCT I-STAT, CHEM 8
BUN: 37 mg/dL — ABNORMAL HIGH (ref 6–23)
CHLORIDE: 94 meq/L — AB (ref 96–112)
CREATININE: 6.3 mg/dL — AB (ref 0.50–1.35)
Calcium, Ion: 0.96 mmol/L — ABNORMAL LOW (ref 1.12–1.23)
GLUCOSE: 75 mg/dL (ref 70–99)
HCT: 36 % — ABNORMAL LOW (ref 39.0–52.0)
Hemoglobin: 12.2 g/dL — ABNORMAL LOW (ref 13.0–17.0)
Potassium: 4.5 mEq/L (ref 3.7–5.3)
SODIUM: 136 meq/L — AB (ref 137–147)
TCO2: 33 mmol/L (ref 0–100)

## 2014-08-15 SURGERY — ANGIOGRAM EXTREMITY RIGHT
Laterality: Right

## 2014-08-15 MED ORDER — MORPHINE SULFATE 2 MG/ML IJ SOLN: 2.0000 mg | INTRAMUSCULAR | Status: DC | PRN

## 2014-08-15 MED ORDER — SODIUM CHLORIDE 0.9 % IV SOLN: 250.0000 mL | INTRAVENOUS | Status: DC | PRN

## 2014-08-15 MED ORDER — OXYCODONE-ACETAMINOPHEN 5-325 MG PO TABS: 1.0000 | ORAL_TABLET | ORAL | Status: DC | PRN

## 2014-08-15 MED ORDER — LIDOCAINE HCL (PF) 1 % IJ SOLN
INTRAMUSCULAR | Status: AC
  Filled 2014-08-15: qty 30

## 2014-08-15 MED ORDER — HEPARIN (PORCINE) IN NACL 2-0.9 UNIT/ML-% IJ SOLN
INTRAMUSCULAR | Status: AC
  Filled 2014-08-15: qty 1000

## 2014-08-15 MED ORDER — MIDAZOLAM HCL 2 MG/2ML IJ SOLN
INTRAMUSCULAR | Status: AC
  Filled 2014-08-15: qty 2

## 2014-08-15 MED ORDER — SODIUM CHLORIDE 0.9 % IJ SOLN: 3.0000 mL | INTRAMUSCULAR | Status: DC | PRN

## 2014-08-15 MED ORDER — SODIUM CHLORIDE 0.9 % IJ SOLN: 3.0000 mL | Freq: Two times a day (BID) | INTRAMUSCULAR | Status: DC

## 2014-08-15 MED ORDER — FENTANYL CITRATE 0.05 MG/ML IJ SOLN
INTRAMUSCULAR | Status: AC
  Filled 2014-08-15: qty 2

## 2014-08-15 NOTE — Discharge Instructions (Signed)

## 2014-08-15 NOTE — Telephone Encounter (Signed)
-----   Message from Mena Goes, RN sent at 08/15/2014 11:18 AM EST ----- Regarding: Schedule   ----- Message -----    From: Conrad [place], MD    Sent: 08/15/2014  10:27 AM      To: [facility] [phone] [birth year]  PROCEDURE: 1.  Right common femoral artery cannulation under ultrasound guidance 2.  Right leg runoff  Follow-up: Dr. Trula Slade next Monday

## 2014-08-15 NOTE — Op Note (Signed)
   OPERATIVE NOTE   PROCEDURE: 1.  Right common femoral artery cannulation under ultrasound guidance 2.  Right leg runoff  PRE-OPERATIVE DIAGNOSIS: Right leg ischemia s/p recent right popliteal intervention  POST-OPERATIVE DIAGNOSIS: same as above   SURGEON: Adele Barthel, MD  ANESTHESIA: conscious sedation  ESTIMATED BLOOD LOSS: 50 cc  CONTRAST: 85 cc  FINDING(S): Heavily calcified entire right arterial system Patent but diseased right common femoral artery, superficial femoral artery and profunda femoral artery  Occluded right popliteal artery at the knee level with reconstitution of below-the-knee segment with disease popliteal stenosis >75% Patent trifurcation with 90% stenosis evident in tibioperoneal trunk  Patent anterior tibial artery with multiple stenoses which result in diminutive vessel at level of the ankle: dominant runoff Patent posterior tibial artery but diminutive throughout, not visualized at ankle level Minimal flow to right foot  SPECIMEN(S):  none  INDICATIONS:   [name] is a 53 y.o. male who presents with recurrent right leg ischemic symptoms despite recent right leg intervention.  The patient presents for: diagnostic right leg runoff.  I discussed with the patient the nature of angiographic procedures, especially the limited patencies of any endovascular intervention.  The patient is aware of that the risks of an angiographic procedure include but are not limited to: bleeding, infection, access site complications, renal failure, embolization, rupture of vessel, dissection, possible need for emergent surgical intervention, possible need for surgical procedures to treat the patient's pathology, and stroke and death.  The patient is aware of the risks and agrees to proceed.  DESCRIPTION: After full informed consent was obtained from the patient, the patient was brought back to the angiography suite.  The patient was placed supine upon the angiography table  and connected to monitoring equipment.  The patient was then given conscious sedation, the amounts of which are documented in the patient's chart.  The patient was prepped and drape in the standard fashion for an angiographic procedure.  At this point, attention was turned to the right groin.  Under ultrasound guidance, the right common femoral artery was cannulated with a micropuncture needle.  The microwire was advanced into the iliac arterial system.  The needle was exchanged for a microsheath, which was loaded into the common femoral artery over the wire.  The microwire was exchanged for a [facility] wire which was advanced into the aorta.  The microsheath was then exchanged for a 5-Fr sheath which was loaded into the common femoral artery.  The right sheath was connected to the power injector circuit and an automated right leg runoff was completed.  The findings are listed above.    In this patient with known significant cardiac disease, I suspect he may not be safe for an open surgical revascularization.  Right above-knee amputation should be considered.  COMPLICATIONS: none  CONDITION: stable  Adele Barthel, MD [facility] Office: [phone] Pager: [phone]  08/15/2014, 10:20 AM

## 2014-08-15 NOTE — H&P (View-Only) (Signed)
Patient name: [name] MRN: [id number] DOB: [birth year] Sex: male     Chief Complaint  Patient presents with  . Re-evaluation    right foot pain and right calf pain  . PVD    HISTORY OF PRESENT ILLNESS: The patient is back today for followup. He initially presented with bilateral lower extremity ulcers on both great toes. On 01/03/2014, he underwent atherectomy and angioplasty of the left popliteal artery. On 01/11/2014, he underwent atherectomy with drug coded balloon angioplasty of the right popliteal artery as well as angioplasty of the right dorsalis pedis artery. After each procedure the patient developed chest pain. He ultimately underwent coronary stenting. I have been following him for bilateral wound on his feet. 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 He was able to heal areas at the base of his bilateral great toes.   He still has dry gangrene to the left great toe. All other ulcers have healed.  The patient underwent percutaneous intervention on the right leg last week.  He is complaining of stabbing pain in his right foot as well as cramping in his calf today.  Past Medical History  Diagnosis Date  . ESRD on hemodialysis     a. ESRD 2/2 HTN with renal transplant in 1987 (cadaveric) after short period of dialysis;  b. Transplant failed in 2004 and he went back on HD;  c. As of 10/15 getting HD via L thigh AVG on a TTS schedule at [facility] on [facility].  . Hypertension   . Hx of kidney transplant     a. 1987-> back on HD since 2004  . Gout tophi   . Chronic steroid use     a. Has severe gout. Did not tolerate Allopurinol. Do not taper per PCP   . Systolic CHF, chronic     2 D echo 04/2012 with EF of 45 %   . Malnutrition   . Hx SBO 04/2012    a. 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 F/U with Dr Donne Hazel. Started on TNA  . Anemia  associated with chronic renal failure   . Hepatitis C   . GERD (gastroesophageal reflux disease)   . H/O hiatal hernia   . Arthritis   . History of DVT (deep vein thrombosis)   . Peripheral vascular disease     a. 12/2013 PTA of L Pop;  b. 12/2013 PTA R pop, R DP;  c. 06/2014 L Pop CBA/DCB PTA.  . Prostate cancer   . CAD (coronary artery disease)     a. 12/2013 Cath/PCI: EF 45-50%, LM Ca2+, LAD 30-40p, 51m, D1/2/3 min irregs, LCX 50ost, 30-69m, RCA 70p, 95/35m (Rota->3.0x23 Xience distal, 3.0x23 Xience mid, 3.0x28 Xience prox).  . Chronic diastolic CHF (congestive heart failure)     a. 12/2013 Echo: EF 55-60%, mild LVH, nl wall motion, Gr 2 DD.  . Dry gangrene     a. L great toe    Past Surgical History  Procedure Laterality Date  . Laparotomy  04/14/2012    Procedure: EXPLORATORY LAPAROTOMY;  Surgeon: Joyice Faster. Cornett, MD;  Location: [place];  Service: General;  Laterality: N/A;  . Colon resection  04/14/2012  . Laparotomy  04/22/2012    Procedure: EXPLORATORY LAPAROTOMY;  Surgeon: Adin Hector, MD;  Location: [place];  Service: General;  Laterality: N/A;  lysis of adhesions, omentoplasty, repair small bowel  . Thrombectomy w/ embolectomy  04/27/2012    Procedure: THROMBECTOMY ARTERIOVENOUS GORE-TEX GRAFT;  Surgeon: Angelia Mould, MD;  Location: [place];  Service: Vascular;  Laterality: Left;  Thrombectomy of left thigh arteriovenous gortex graft  . Prostectomy  2011  . Renal grafts    . Pelvic abcess drainage Right 6/14    removal drain s/p bowl resection 13  . Transanal excision of rectal mass N/A 03/30/2013    Procedure: EXCISION OF anal MASS;  Surgeon: Joyice Faster. Cornett, MD;  Location: [place];  Service: General;  Laterality: N/A;  Exam under anesthesia with excision anal verge mass    History   Social History  . Marital Status: Single    Spouse Name: N/A    Number of Children: N/A  . Years of Education: N/A   Occupational History  . Not on file.   Social History Main Topics   . Smoking status: Former Smoker -- 1.00 packs/day for 10 years    Types: Cigarettes    Quit date: 03/23/1973  . Smokeless tobacco: Never Used     Comment: Quit 30 years ago  . Alcohol Use: No  . Drug Use: No  . Sexual Activity: No   Other Topics Concern  . Not on file   Social History Narrative   Currently in the nursing home until end of the month then will go to live with sister.           Family History  Problem Relation Age of Onset  . Hypertension Father   . Pneumonia Brother   . Lung disease Brother     Allergies as of 08/07/2014 - Review Complete 08/07/2014  Allergen Reaction Noted  . Allopurinol Other (See Comments) 02/27/2010    Current Outpatient Prescriptions on File Prior to Visit  Medication Sig Dispense Refill  . aspirin EC 81 MG tablet Take 81 mg by mouth daily.    Marland Kitchen atorvastatin (LIPITOR) 40 MG tablet Take 1 tablet (40 mg total) by mouth daily at 6 PM. 30 tablet 6  . calcium acetate (PHOSLO) 667 MG tablet Take 2,001 mg by mouth 3 (three) times daily.     . carvedilol (COREG) 6.25 MG tablet Take 1 tablet (6.25 mg total) by mouth 2 (two) times daily with a meal. 60 tablet 6  . cinacalcet (SENSIPAR) 60 MG tablet Take 60 mg by mouth daily.    . clopidogrel (PLAVIX) 75 MG tablet Take 1 tablet (75 mg total) by mouth daily with breakfast. 30 tablet 10  . COLCRYS 0.6 MG tablet Take 0.6 mg by mouth daily.     Marland Kitchen LIDOCAINE EX Apply 1 application topically See admin instructions. Apply to inject site on dialysis days (Tues, Thur, Sat) if needed for pain    . loperamide (IMODIUM A-D) 2 MG tablet Take 2 mg by mouth daily as needed for diarrhea or loose stools.     . multivitamin (RENA-VIT) TABS tablet Take 1 tablet by mouth daily. 30 tablet 0  . nitroGLYCERIN (NITRODUR - DOSED IN MG/24 HR) 0.3 mg/hr patch Place 1 patch (0.3 mg total) onto the skin daily. 30 patch 12  . oxyCODONE (OXY IR/ROXICODONE) 5 MG immediate release tablet Take 5 mg by mouth every 6 (six) hours as  needed for severe pain.    . pantoprazole (PROTONIX) 40 MG tablet Take 1 tablet (40 mg total) by mouth daily. 30 tablet 6  . predniSONE (DELTASONE) 5 MG tablet Take  5 mg by mouth daily.    Marland Kitchen oxyCODONE (ROXICODONE) 5 MG immediate release tablet Take 1 tablet (5 mg total) by mouth every 4 (four) hours as needed for severe pain. 30 tablet 0   Current Facility-Administered Medications on File Prior to Visit  Medication Dose Route Frequency Provider Last Rate Last Dose  . fentaNYL (SUBLIMAZE) injection   Intravenous PRN Carylon Perches, MD   25 mcg at 05/05/12 1544  . midazolam (VERSED) 5 MG/5ML injection   Intravenous PRN Carylon Perches, MD   2 mg at 05/05/12 1544     REVIEW OF SYSTEMS: See history of present illness, otherwise negative  PHYSICAL EXAMINATION:   Vital signs are BP 149/73 mmHg  Pulse 92  Resp 16  Ht 5\' 11"  (1.803 m)  Wt 108 lb (48.988 kg)  BMI 15.07 kg/m2 General: The patient appears their stated age. HEENT:  No gross abnormalities Pulmonary:  Non labored breathing Abdomen: Soft and non-tender Musculoskeletal: There are no major deformities. Neurologic: normal motor and sensory function to the right foot Skin: Dry gangrene, left great toe Psychiatric: The patient has normal affect. Cardiovascular: There is a regular rate and rhythm without significant murmur appreciated.   Diagnostic Studies Ultrasound today shows a patent popliteal artery at the level of the intervention.  The tibioperoneal trunk appears to be occluded  Assessment: Peripheral vascular disease, status post bilateral intervention Plan: Ultrasound today shows a tibioperoneal trunk occlusion which is a new finding.  The anterior tibial artery remains widely patent as a single vessel runoff down to the foot.  I have reviewed the angiographic images.  He should have adequate blood flow from the anterior tibial artery to prevent any major issues.  I am giving him some Dilaudid for pain control.  He will  follow-up in 3 months  V. Leia Alf, M.D. [facility] [place] Office: [phone] Pager:  [phone]

## 2014-08-15 NOTE — Progress Notes (Signed)
Site area: rt groin Site Prior to Removal:  Level 0 Pressure Applied For: 20 minutes Manual:   yes Patient Status During Pull:  stable Post Pull Site:  Level 0 Post Pull Instructions Given:  yes Post Pull Pulses Present: yes Dressing Applied:  tegaderm Bedrest begins @ 1100 Comments: no complications

## 2014-08-15 NOTE — Interval H&P Note (Signed)
[facility] [place]  History and Physical Update  The patient was interviewed and re-examined.  The patient's previous History and Physical has been reviewed and is unchanged from Dr. Stephens Shire consult.  There is no change in the plan of care: R leg runoff, possible intervention.  Risk, benefits, and alternatives to access surgery were discussed.  The patient is aware the risks include but are not limited to: bleeding, infection, steal syndrome, nerve damage, ischemic monomelic neuropathy, failure to mature, need for additional procedures, death and stroke.  The patient agrees to proceed forward with the procedure.  Adele Barthel, MD [facility] [place] Office: [phone] Pager: [phone]  08/15/2014, 7:34 AM

## 2014-08-15 NOTE — Telephone Encounter (Addendum)
-----   Message from Mena Goes, RN sent at 08/15/2014 11:18 AM EST ----- Regarding: Schedule   ----- Message -----    From: Conrad [place], MD    Sent: 08/15/2014  10:27 AM      To: [facility] [place] [phone] [birth year]  PROCEDURE: 1.  Right common femoral artery cannulation under ultrasound guidance 2.  Right leg runoff  Follow-up: Dr. Trula Slade next Monday   08/15/14: left msg for pt, dpm

## 2014-08-16 ENCOUNTER — Encounter (HOSPITAL_COMMUNITY): Payer: Self-pay | Admitting: General Practice

## 2014-08-16 ENCOUNTER — Inpatient Hospital Stay (HOSPITAL_COMMUNITY)
Admission: AD | Admit: 2014-08-16 | Discharge: 2014-09-05 | DRG: 252 | Disposition: A | Discharge status: Home Health | Payer: Medicare Other | Source: Ambulatory Visit | Attending: Internal Medicine | Admitting: Internal Medicine

## 2014-08-16 ENCOUNTER — Telehealth: Payer: Self-pay

## 2014-08-16 DIAGNOSIS — Z992 Dependence on renal dialysis: Secondary | ICD-10-CM

## 2014-08-16 DIAGNOSIS — I7092 Chronic total occlusion of artery of the extremities: Secondary | ICD-10-CM | POA: Diagnosis present

## 2014-08-16 DIAGNOSIS — I5032 Chronic diastolic (congestive) heart failure: Secondary | ICD-10-CM | POA: Diagnosis present

## 2014-08-16 DIAGNOSIS — K559 Vascular disorder of intestine, unspecified: Secondary | ICD-10-CM | POA: Insufficient documentation

## 2014-08-16 DIAGNOSIS — N2581 Secondary hyperparathyroidism of renal origin: Secondary | ICD-10-CM | POA: Diagnosis present

## 2014-08-16 DIAGNOSIS — I82412 Acute embolism and thrombosis of left femoral vein: Secondary | ICD-10-CM | POA: Diagnosis present

## 2014-08-16 DIAGNOSIS — Z87891 Personal history of nicotine dependence: Secondary | ICD-10-CM | POA: Diagnosis not present

## 2014-08-16 DIAGNOSIS — R578 Other shock: Secondary | ICD-10-CM | POA: Insufficient documentation

## 2014-08-16 DIAGNOSIS — I96 Gangrene, not elsewhere classified: Secondary | ICD-10-CM | POA: Diagnosis present

## 2014-08-16 DIAGNOSIS — Z681 Body mass index (BMI) 19 or less, adult: Secondary | ICD-10-CM | POA: Diagnosis not present

## 2014-08-16 DIAGNOSIS — I739 Peripheral vascular disease, unspecified: Secondary | ICD-10-CM | POA: Diagnosis not present

## 2014-08-16 DIAGNOSIS — K922 Gastrointestinal hemorrhage, unspecified: Secondary | ICD-10-CM

## 2014-08-16 DIAGNOSIS — K921 Melena: Secondary | ICD-10-CM

## 2014-08-16 DIAGNOSIS — Z940 Kidney transplant status: Secondary | ICD-10-CM | POA: Diagnosis not present

## 2014-08-16 DIAGNOSIS — R64 Cachexia: Secondary | ICD-10-CM | POA: Diagnosis present

## 2014-08-16 DIAGNOSIS — I70229 Atherosclerosis of native arteries of extremities with rest pain, unspecified extremity: Secondary | ICD-10-CM

## 2014-08-16 DIAGNOSIS — B182 Chronic viral hepatitis C: Secondary | ICD-10-CM | POA: Diagnosis present

## 2014-08-16 DIAGNOSIS — D62 Acute posthemorrhagic anemia: Secondary | ICD-10-CM | POA: Diagnosis not present

## 2014-08-16 DIAGNOSIS — Z955 Presence of coronary angioplasty implant and graft: Secondary | ICD-10-CM | POA: Diagnosis not present

## 2014-08-16 DIAGNOSIS — Z7952 Long term (current) use of systemic steroids: Secondary | ICD-10-CM | POA: Diagnosis not present

## 2014-08-16 DIAGNOSIS — E43 Unspecified severe protein-calorie malnutrition: Secondary | ICD-10-CM | POA: Diagnosis not present

## 2014-08-16 DIAGNOSIS — Z86718 Personal history of other venous thrombosis and embolism: Secondary | ICD-10-CM | POA: Diagnosis not present

## 2014-08-16 DIAGNOSIS — N186 End stage renal disease: Secondary | ICD-10-CM

## 2014-08-16 DIAGNOSIS — Z7982 Long term (current) use of aspirin: Secondary | ICD-10-CM | POA: Diagnosis not present

## 2014-08-16 DIAGNOSIS — I959 Hypotension, unspecified: Secondary | ICD-10-CM | POA: Diagnosis not present

## 2014-08-16 DIAGNOSIS — E162 Hypoglycemia, unspecified: Secondary | ICD-10-CM | POA: Diagnosis not present

## 2014-08-16 DIAGNOSIS — I2510 Atherosclerotic heart disease of native coronary artery without angina pectoris: Secondary | ICD-10-CM

## 2014-08-16 DIAGNOSIS — Z452 Encounter for adjustment and management of vascular access device: Secondary | ICD-10-CM

## 2014-08-16 DIAGNOSIS — K219 Gastro-esophageal reflux disease without esophagitis: Secondary | ICD-10-CM | POA: Diagnosis present

## 2014-08-16 DIAGNOSIS — I771 Stricture of artery: Secondary | ICD-10-CM | POA: Diagnosis present

## 2014-08-16 DIAGNOSIS — R1032 Left lower quadrant pain: Secondary | ICD-10-CM

## 2014-08-16 DIAGNOSIS — I5022 Chronic systolic (congestive) heart failure: Secondary | ICD-10-CM | POA: Diagnosis present

## 2014-08-16 DIAGNOSIS — Z8546 Personal history of malignant neoplasm of prostate: Secondary | ICD-10-CM | POA: Diagnosis not present

## 2014-08-16 DIAGNOSIS — F419 Anxiety disorder, unspecified: Secondary | ICD-10-CM | POA: Diagnosis not present

## 2014-08-16 DIAGNOSIS — T82898A Other specified complication of vascular prosthetic devices, implants and grafts, initial encounter: Secondary | ICD-10-CM | POA: Diagnosis not present

## 2014-08-16 DIAGNOSIS — I998 Other disorder of circulatory system: Secondary | ICD-10-CM

## 2014-08-16 DIAGNOSIS — I120 Hypertensive chronic kidney disease with stage 5 chronic kidney disease or end stage renal disease: Secondary | ICD-10-CM | POA: Diagnosis present

## 2014-08-16 DIAGNOSIS — R791 Abnormal coagulation profile: Secondary | ICD-10-CM | POA: Diagnosis not present

## 2014-08-16 DIAGNOSIS — M1A9XX1 Chronic gout, unspecified, with tophus (tophi): Secondary | ICD-10-CM | POA: Diagnosis present

## 2014-08-16 DIAGNOSIS — G8929 Other chronic pain: Secondary | ICD-10-CM | POA: Diagnosis present

## 2014-08-16 DIAGNOSIS — K5900 Constipation, unspecified: Secondary | ICD-10-CM | POA: Diagnosis present

## 2014-08-16 DIAGNOSIS — I10 Essential (primary) hypertension: Secondary | ICD-10-CM

## 2014-08-16 LAB — COMPREHENSIVE METABOLIC PANEL
ALBUMIN: 2.6 g/dL — AB (ref 3.5–5.2)
ALK PHOS: 139 U/L — AB (ref 39–117)
ALT: 23 U/L (ref 0–53)
AST: 30 U/L (ref 0–37)
Anion gap: 25 — ABNORMAL HIGH (ref 5–15)
BILIRUBIN TOTAL: 0.7 mg/dL (ref 0.3–1.2)
BUN: 59 mg/dL — ABNORMAL HIGH (ref 6–23)
CHLORIDE: 88 meq/L — AB (ref 96–112)
CO2: 22 mEq/L (ref 19–32)
Calcium: 8.5 mg/dL (ref 8.4–10.5)
Creatinine, Ser: 9.43 mg/dL — ABNORMAL HIGH (ref 0.50–1.35)
GFR calc Af Amer: 6 mL/min — ABNORMAL LOW (ref >90)
GFR, EST NON AFRICAN AMERICAN: 5 mL/min — AB (ref >90)
Glucose, Bld: 107 mg/dL — ABNORMAL HIGH (ref 70–99)
POTASSIUM: 4.5 meq/L (ref 3.7–5.3)
Sodium: 135 mEq/L — ABNORMAL LOW (ref 137–147)
Total Protein: 7 g/dL (ref 6.0–8.3)

## 2014-08-16 LAB — CBC
HCT: 31.2 % — ABNORMAL LOW (ref 39.0–52.0)
Hemoglobin: 10.1 g/dL — ABNORMAL LOW (ref 13.0–17.0)
MCH: 28 pg (ref 26.0–34.0)
MCHC: 32.4 g/dL (ref 30.0–36.0)
MCV: 86.4 fL (ref 78.0–100.0)
PLATELETS: 172 10*3/uL (ref 150–400)
RBC: 3.61 MIL/uL — AB (ref 4.22–5.81)
RDW: 15.6 % — AB (ref 11.5–15.5)
WBC: 10 10*3/uL (ref 4.0–10.5)

## 2014-08-16 LAB — PROTIME-INR
INR: 1.18 (ref 0.00–1.49)
Prothrombin Time: 15.1 seconds (ref 11.6–15.2)

## 2014-08-16 LAB — HEPARIN LEVEL (UNFRACTIONATED): Heparin Unfractionated: 0.46 IU/mL (ref 0.30–0.70)

## 2014-08-16 MED ORDER — ATORVASTATIN CALCIUM 40 MG PO TABS
40.0000 mg | ORAL_TABLET | Freq: Every day | ORAL | Status: DC
  Administered 2014-08-16 – 2014-09-04 (×16): 40 mg via ORAL
  Filled 2014-08-16 (×21): qty 1

## 2014-08-16 MED ORDER — COLCHICINE 0.6 MG PO TABS
0.6000 mg | ORAL_TABLET | Freq: Every day | ORAL | Status: DC
  Administered 2014-08-17: 0.6 mg via ORAL
  Filled 2014-08-16 (×2): qty 1

## 2014-08-16 MED ORDER — SODIUM CHLORIDE 0.9 % IV SOLN
125.0000 mg | INTRAVENOUS | Status: AC
  Administered 2014-08-19 – 2014-08-24 (×3): 125 mg via INTRAVENOUS
  Filled 2014-08-16 (×7): qty 10

## 2014-08-16 MED ORDER — POTASSIUM CHLORIDE CRYS ER 20 MEQ PO TBCR
20.0000 meq | EXTENDED_RELEASE_TABLET | Freq: Once | ORAL | Status: DC
  Filled 2014-08-16: qty 2

## 2014-08-16 MED ORDER — SODIUM CHLORIDE 0.9 % IV SOLN
125.0000 mg | Freq: Once | INTRAVENOUS | Status: AC
  Administered 2014-08-16: 125 mg via INTRAVENOUS
  Filled 2014-08-16 (×2): qty 10

## 2014-08-16 MED ORDER — SODIUM CHLORIDE 0.9 % IV SOLN: 100.0000 mL | INTRAVENOUS | Status: DC | PRN

## 2014-08-16 MED ORDER — PREDNISONE 5 MG PO TABS
5.0000 mg | ORAL_TABLET | Freq: Every day | ORAL | Status: DC
  Administered 2014-08-17 – 2014-08-28 (×8): 5 mg via ORAL
  Filled 2014-08-16 (×15): qty 1

## 2014-08-16 MED ORDER — OXYCODONE HCL 5 MG PO TABS
5.0000 mg | ORAL_TABLET | ORAL | Status: DC | PRN
Start: 2014-08-16 — End: 2014-09-05
  Administered 2014-08-18 – 2014-09-05 (×26): 5 mg via ORAL
  Filled 2014-08-16 (×27): qty 1

## 2014-08-16 MED ORDER — PENTAFLUOROPROP-TETRAFLUOROETH EX AERO: 1.0000 "application " | INHALATION_SPRAY | CUTANEOUS | Status: DC | PRN

## 2014-08-16 MED ORDER — DARBEPOETIN ALFA 25 MCG/0.42ML IJ SOSY
PREFILLED_SYRINGE | INTRAMUSCULAR | Status: AC
  Administered 2014-08-16: 25 ug via INTRAVENOUS
  Filled 2014-08-16: qty 0.42

## 2014-08-16 MED ORDER — ACETAMINOPHEN 325 MG PO TABS
325.0000 mg | ORAL_TABLET | ORAL | Status: DC | PRN
  Administered 2014-08-19: 650 mg via ORAL
  Administered 2014-08-20: 325 mg via ORAL
  Administered 2014-08-20: 650 mg via ORAL
  Filled 2014-08-16: qty 1
  Filled 2014-08-16 (×3): qty 2

## 2014-08-16 MED ORDER — CINACALCET HCL 30 MG PO TABS
60.0000 mg | ORAL_TABLET | Freq: Every day | ORAL | Status: DC
  Administered 2014-08-17 – 2014-09-05 (×14): 60 mg via ORAL
  Filled 2014-08-16 (×21): qty 2

## 2014-08-16 MED ORDER — DOCUSATE SODIUM 100 MG PO CAPS
100.0000 mg | ORAL_CAPSULE | Freq: Two times a day (BID) | ORAL | Status: DC
  Administered 2014-08-17 – 2014-09-05 (×20): 100 mg via ORAL
  Filled 2014-08-16 (×34): qty 1

## 2014-08-16 MED ORDER — LIDOCAINE-PRILOCAINE 2.5-2.5 % EX CREA
1.0000 "application " | TOPICAL_CREAM | CUTANEOUS | Status: DC | PRN
  Filled 2014-08-16: qty 5

## 2014-08-16 MED ORDER — ACETAMINOPHEN 650 MG RE SUPP: 325.0000 mg | RECTAL | Status: DC | PRN

## 2014-08-16 MED ORDER — ALUM & MAG HYDROXIDE-SIMETH 200-200-20 MG/5ML PO SUSP: 15.0000 mL | ORAL | Status: DC | PRN

## 2014-08-16 MED ORDER — AMLODIPINE BESYLATE 5 MG PO TABS
5.0000 mg | ORAL_TABLET | Freq: Every day | ORAL | Status: DC
  Administered 2014-08-16 – 2014-08-17 (×2): 5 mg via ORAL
  Filled 2014-08-16 (×4): qty 1

## 2014-08-16 MED ORDER — DARBEPOETIN ALFA 25 MCG/0.42ML IJ SOSY
25.0000 ug | PREFILLED_SYRINGE | INTRAMUSCULAR | Status: DC
  Administered 2014-08-16: 25 ug via INTRAVENOUS

## 2014-08-16 MED ORDER — PHENOL 1.4 % MT LIQD
1.0000 | OROMUCOSAL | Status: DC | PRN
  Filled 2014-08-16: qty 177

## 2014-08-16 MED ORDER — MORPHINE SULFATE 2 MG/ML IJ SOLN
2.0000 mg | INTRAMUSCULAR | Status: DC | PRN
  Administered 2014-08-16 – 2014-08-17 (×10): 2 mg via INTRAVENOUS
  Filled 2014-08-16 (×8): qty 1

## 2014-08-16 MED ORDER — LABETALOL HCL 5 MG/ML IV SOLN
10.0000 mg | INTRAVENOUS | Status: DC | PRN
  Filled 2014-08-16: qty 4

## 2014-08-16 MED ORDER — MORPHINE SULFATE 2 MG/ML IJ SOLN
INTRAMUSCULAR | Status: AC
  Administered 2014-08-16: 2 mg via INTRAVENOUS
  Filled 2014-08-16: qty 1

## 2014-08-16 MED ORDER — HEPARIN BOLUS VIA INFUSION
2500.0000 [IU] | Freq: Once | INTRAVENOUS | Status: AC
  Administered 2014-08-16: 2500 [IU] via INTRAVENOUS
  Filled 2014-08-16: qty 2500

## 2014-08-16 MED ORDER — HYDRALAZINE HCL 20 MG/ML IJ SOLN: 5.0000 mg | INTRAMUSCULAR | Status: DC | PRN

## 2014-08-16 MED ORDER — CALCITRIOL 0.5 MCG PO CAPS
2.7500 ug | ORAL_CAPSULE | Freq: Once | ORAL | Status: DC
  Filled 2014-08-16: qty 1

## 2014-08-16 MED ORDER — ASPIRIN EC 81 MG PO TBEC
81.0000 mg | DELAYED_RELEASE_TABLET | Freq: Every day | ORAL | Status: DC
  Administered 2014-08-17 – 2014-08-25 (×7): 81 mg via ORAL
  Filled 2014-08-16 (×11): qty 1

## 2014-08-16 MED ORDER — HEPARIN (PORCINE) IN NACL 100-0.45 UNIT/ML-% IJ SOLN
750.0000 [IU]/h | INTRAMUSCULAR | Status: DC
  Administered 2014-08-16: 700 [IU]/h via INTRAVENOUS
  Administered 2014-08-17: 750 [IU]/h via INTRAVENOUS
  Filled 2014-08-16 (×3): qty 250

## 2014-08-16 MED ORDER — ONDANSETRON HCL 4 MG/2ML IJ SOLN
4.0000 mg | Freq: Four times a day (QID) | INTRAMUSCULAR | Status: DC | PRN
  Administered 2014-08-27: 4 mg via INTRAVENOUS
  Filled 2014-08-16: qty 2

## 2014-08-16 MED ORDER — CALCITRIOL 0.5 MCG PO CAPS
2.7500 ug | ORAL_CAPSULE | ORAL | Status: DC
  Administered 2014-08-19 – 2014-09-05 (×7): 2.75 ug via ORAL
  Filled 2014-08-16 (×11): qty 1

## 2014-08-16 MED ORDER — METOPROLOL TARTRATE 1 MG/ML IV SOLN
2.0000 mg | INTRAVENOUS | Status: DC | PRN
Start: 2014-08-16 — End: 2014-08-18

## 2014-08-16 MED ORDER — NITROGLYCERIN 0.3 MG/HR TD PT24
0.3000 mg | MEDICATED_PATCH | Freq: Every day | TRANSDERMAL | Status: DC
  Administered 2014-08-16 – 2014-08-28 (×11): 0.3 mg via TRANSDERMAL
  Filled 2014-08-16 (×14): qty 1

## 2014-08-16 MED ORDER — GUAIFENESIN-DM 100-10 MG/5ML PO SYRP
15.0000 mL | ORAL_SOLUTION | ORAL | Status: DC | PRN
  Filled 2014-08-16: qty 15

## 2014-08-16 MED ORDER — HEPARIN SODIUM (PORCINE) 1000 UNIT/ML DIALYSIS: 1000.0000 [IU] | INTRAMUSCULAR | Status: DC | PRN

## 2014-08-16 MED ORDER — SODIUM CHLORIDE 0.9 % IV SOLN
INTRAVENOUS | Status: DC
  Administered 2014-08-17: 10 mL/h via INTRAVENOUS
  Administered 2014-08-18 – 2014-08-31 (×3): via INTRAVENOUS

## 2014-08-16 MED ORDER — LIDOCAINE HCL (PF) 1 % IJ SOLN: 5.0000 mL | INTRAMUSCULAR | Status: DC | PRN

## 2014-08-16 MED ORDER — CARVEDILOL 6.25 MG PO TABS
6.2500 mg | ORAL_TABLET | Freq: Two times a day (BID) | ORAL | Status: DC
  Administered 2014-08-16 – 2014-08-17 (×2): 6.25 mg via ORAL
  Filled 2014-08-16 (×6): qty 1

## 2014-08-16 MED ORDER — PANTOPRAZOLE SODIUM 40 MG PO TBEC
40.0000 mg | DELAYED_RELEASE_TABLET | Freq: Every day | ORAL | Status: DC
  Administered 2014-08-16 – 2014-09-05 (×17): 40 mg via ORAL
  Filled 2014-08-16 (×18): qty 1

## 2014-08-16 MED ORDER — ALTEPLASE 2 MG IJ SOLR
2.0000 mg | Freq: Once | INTRAMUSCULAR | Status: DC | PRN
  Filled 2014-08-16: qty 2

## 2014-08-16 MED ORDER — NEPRO/CARBSTEADY PO LIQD
237.0000 mL | ORAL | Status: DC | PRN
  Filled 2014-08-16: qty 237

## 2014-08-16 MED ORDER — OXYCODONE HCL 5 MG PO TABS
5.0000 mg | ORAL_TABLET | ORAL | Status: DC | PRN
  Administered 2014-08-16 – 2014-08-18 (×6): 10 mg via ORAL
  Filled 2014-08-16 (×6): qty 2

## 2014-08-16 MED ORDER — RENA-VITE PO TABS
1.0000 | ORAL_TABLET | Freq: Every day | ORAL | Status: DC
  Administered 2014-08-16 – 2014-09-04 (×13): 1 via ORAL
  Filled 2014-08-16 (×21): qty 1

## 2014-08-16 MED ORDER — CALCIUM ACETATE 667 MG PO CAPS
2001.0000 mg | ORAL_CAPSULE | Freq: Three times a day (TID) | ORAL | Status: DC
  Administered 2014-08-17 – 2014-08-28 (×22): 2001 mg via ORAL
  Administered 2014-08-29: 1334 mg via ORAL
  Administered 2014-08-29 – 2014-09-05 (×16): 2001 mg via ORAL
  Filled 2014-08-16 (×65): qty 3

## 2014-08-16 NOTE — Progress Notes (Signed)
ANTICOAGULATION CONSULT NOTE - Initial Consult  Pharmacy Consult for Heparin Indication: occluded R popliteal artery  Allergies  Allergen Reactions  . Allopurinol Other (See Comments)    REACTION: decreased platelets    Patient Measurements: Height: 5\' 11"  (180.3 cm) Weight: 105 lb (47.628 kg) IBW/kg (Calculated) : 75.3 Heparin Dosing Weight: 47.6kg  Vital Signs: Temp: 98.7 F (37.1 C) (11/25 1253) Temp Source: Oral (11/25 1253) BP: 155/21 mmHg (11/25 1253) Pulse Rate: 86 (11/25 1253)  Labs:  Recent Labs  08/15/14 0759  HGB 12.2*  HCT 36.0*  CREATININE 6.30*    Estimated Creatinine Clearance: 8.9 mL/min (by C-G formula based on Cr of 6.3).   Medical History: Past Medical History  Diagnosis Date  . ESRD on hemodialysis     a. ESRD 2/2 HTN with renal transplant in 1987 (cadaveric) after short period of dialysis;  b. Transplant failed in 2004 and he went back on HD;  c. As of 10/15 getting HD via L thigh AVG on a TTS schedule at [facility] on [facility].  . Hypertension   . Hx of kidney transplant     a. 1987-> back on HD since 2004  . Gout tophi   . Chronic steroid use     a. Has severe gout. Did not tolerate Allopurinol. Do not taper per PCP   . Systolic CHF, chronic     2 D echo 04/2012 with EF of 45 %   . Malnutrition   . Hx SBO 04/2012    a. 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 F/U with Dr Donne Hazel. Started on TNA  . Anemia associated with chronic renal failure   . Hepatitis C   . GERD (gastroesophageal reflux disease)   . H/O hiatal hernia   . Arthritis   . History of DVT (deep vein thrombosis)   . Peripheral vascular disease     a. 12/2013 PTA of L Pop;  b. 12/2013 PTA R pop, R DP;  c. 06/2014 L Pop CBA/DCB PTA.  . Prostate cancer   . CAD (coronary artery disease)     a. 12/2013 Cath/PCI: EF 45-50%, LM Ca2+, LAD 30-40p, 109m, D1/2/3 min irregs, LCX 50ost, 30-53m, RCA 70p,  95/4m (Rota->3.0x23 Xience distal, 3.0x23 Xience mid, 3.0x28 Xience prox).  . Chronic diastolic CHF (congestive heart failure)     a. 12/2013 Echo: EF 55-60%, mild LVH, nl wall motion, Gr 2 DD.  . Dry gangrene     a. L great toe    Medications:  Prescriptions prior to admission  Medication Sig Dispense Refill Last Dose  . amLODipine (NORVASC) 5 MG tablet Take 5 mg by mouth daily.   11 08/15/2014 at Unknown time  . aspirin EC 81 MG tablet Take 81 mg by mouth daily.   08/16/2014 at Unknown time  . atorvastatin (LIPITOR) 40 MG tablet TAKE 1 TABLET BY MOUTH DAILY AT 6 PM 30 tablet 9 08/15/2014 at Unknown time  . calcium acetate (PHOSLO) 667 MG tablet Take 2,001 mg by mouth 3 (three) times daily.    08/15/2014 at Unknown time  . carvedilol (COREG) 6.25 MG tablet TAKE 1 TABLET BY MOUTH TWICE DAILY WITH MEALS 60 tablet 9 08/16/2014 at 0800  . cinacalcet (SENSIPAR) 60 MG tablet Take 60 mg by mouth daily.   08/15/2014 at Unknown time  . clopidogrel (PLAVIX) 75 MG tablet Take 1 tablet (75 mg total) by mouth daily with breakfast.  30 tablet 10 08/16/2014 at Unknown time  . COLCRYS 0.6 MG tablet Take 0.6 mg by mouth daily.    08/16/2014 at Unknown time  . HYDROmorphone (DILAUDID) 2 MG tablet Take 1 to 2 tablets by mouth every 6 hours as needed for postoperative pain. (Patient taking differently: Take 1-2 mg by mouth every 6 (six) hours as needed (pain). ) 40 tablet 0 08/16/2014 at Unknown time  . LIDOCAINE EX Apply 1 application topically See admin instructions. Apply to inject site on dialysis days (Tues, Thur, Sat) if needed for pain   08/15/2014 at Unknown time  . loperamide (IMODIUM A-D) 2 MG tablet Take 2 mg by mouth daily as needed for diarrhea or loose stools.    unknown  . nitroGLYCERIN (NITRODUR - DOSED IN MG/24 HR) 0.3 mg/hr patch Place 1 patch (0.3 mg total) onto the skin daily. 30 patch 12 Past Week at Unknown time  . oxyCODONE (ROXICODONE) 5 MG immediate release tablet Take 1 tablet (5 mg  total) by mouth every 4 (four) hours as needed for severe pain. 30 tablet 0 Past Week at Unknown time  . pantoprazole (PROTONIX) 40 MG tablet Take 1 tablet (40 mg total) by mouth daily. 30 tablet 6 08/15/2014 at Unknown time  . predniSONE (DELTASONE) 5 MG tablet Take 5 mg by mouth daily.   08/16/2014 at Unknown time  . multivitamin (RENA-VIT) TABS tablet Take 1 tablet by mouth daily. 30 tablet 0 08/14/2014 at Unknown time    Assessment: 53yom to start heparin for occluded R popliteal artery. Patient reports no bleeding and not on any anticoagulants. - Labs 11/22-24: Hg 12.2, Plts 202  Goal of Therapy:  Heparin level 0.3-0.7 units/ml Monitor platelets by anticoagulation protocol: Yes   Plan:  1. Heparin IV bolus 2500 units x 1 2. Heparin drip 700 units/hr (7 ml/hr) 3. Check heparin level 8 hours after initiation 4. Daily heparin level and CBC  Earleen Newport  [phone] 08/16/2014,1:48 PM

## 2014-08-16 NOTE — Consult Note (Signed)
[facility] Renal Consultation Note  Indication for Consultation:  Management of ESRD/hemodialysis; anemia, hypertension/volume and secondary hyperparathyroidism  HPI: [name] is a 53 y.o. AA male with a history of peripheral vascular disease, CAD s/p PCI in 12/2013,  CHF, Hepatitis C, small bowel obstruction s/p exploratory laparotomy in 03/2012, and ESRD on dialysis at the [facility] who had drug-coated balloon angioplasty of the right popliteal artery per Dr. Trula Slade on 11/11, but returned to the ED 11/22 with worsening right foot pain and was found to have significant plaque with reduced flows throughout the right lower extremity, similar to previous studies at [facility], but was scheduled for right lower extremity angiogram yesterday per Dr. Bridgett Larsson.  Right leg runoff showed heavily calcified right arterial system with multiple degrees of stenosis, including an occluded right popliteal artery.  Today the patient was unable to stand to attend dialysis secondary to severe right foot pain and was admitted to the hospital for management.  He is currently comfortable while in bed, but will require right lower extremity bypass surgery during this admission.  He will have his scheduled dialysis today.  Past Medical History  Diagnosis Date  . ESRD on hemodialysis     a. ESRD 2/2 HTN with renal transplant in 1987 (cadaveric) after short period of dialysis;  b. Transplant failed in 2004 and he went back on HD;  c. As of 10/15 getting HD via L thigh AVG on a TTS schedule at [facility].  . Hypertension   . Hx of kidney transplant     a. 1987-> back on HD since 2004  . Gout tophi   . Chronic steroid use     a. Has severe gout. Did not tolerate Allopurinol. Do not taper per PCP   . Systolic CHF, chronic     2 D echo 04/2012 with EF of 45 %   . Malnutrition   . Hx SBO 04/2012    a. 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 F/U with Dr Donne Hazel. Started on TNA  . Anemia associated with chronic renal failure   . Hepatitis C   . GERD (gastroesophageal reflux disease)   . H/O hiatal hernia   . Arthritis   . History of DVT (deep vein thrombosis)   . Peripheral vascular disease     a. 12/2013 PTA of L Pop;  b. 12/2013 PTA R pop, R DP;  c. 06/2014 L Pop CBA/DCB PTA.  . Prostate cancer   . CAD (coronary artery disease)     a. 12/2013 Cath/PCI: EF 45-50%, LM Ca2+, LAD 30-40p, 79m, D1/2/3 min irregs, LCX 50ost, 30-77m, RCA 70p, 95/84m (Rota->3.0x23 Xience distal, 3.0x23 Xience mid, 3.0x28 Xience prox).  . Chronic diastolic CHF (congestive heart failure)     a. 12/2013 Echo: EF 55-60%, mild LVH, nl wall motion, Gr 2 DD.  . Dry gangrene     a. L great toe   Past Surgical History  Procedure Laterality Date  . Laparotomy  04/14/2012    Procedure: EXPLORATORY LAPAROTOMY;  Surgeon: Joyice Faster. Cornett, MD;  Location: [place];  Service: General;  Laterality: N/A;  . Colon resection  04/14/2012  . Laparotomy  04/22/2012    Procedure: EXPLORATORY LAPAROTOMY;  Surgeon: Adin Hector, MD;  Location: [place];  Service: General;  Laterality: N/A;  lysis of adhesions, omentoplasty, repair small bowel  . Thrombectomy w/ embolectomy  04/27/2012    Procedure: THROMBECTOMY ARTERIOVENOUS GORE-TEX GRAFT;  Surgeon: Angelia Mould, MD;  Location: [place];  Service: Vascular;  Laterality: Left;  Thrombectomy of left thigh arteriovenous gortex graft  . Prostectomy  2011  . Renal grafts    . Pelvic abcess drainage Right 6/14    removal drain s/p bowl resection 13  . Transanal excision of rectal mass N/A 03/30/2013    Procedure: EXCISION OF anal MASS;  Surgeon: Joyice Faster. Cornett, MD;  Location: [place];  Service: General;  Laterality: N/A;  Exam under anesthesia with excision anal verge mass   Family History  Problem Relation Age of Onset  . Hypertension Father   . Pneumonia Brother   . Lung disease Brother    Social  History  He quit smoking cigarettes about 41 years ago after a 10 pack-year smoking history.  He previously drank alcohol and smoked marijuana, but denies any use of tobacco, alcohol, or illicit drugs in recent years.  Allergies  Allergen Reactions  . Allopurinol Other (See Comments)    REACTION: decreased platelets   Prior to Admission medications   Medication Sig Start Date End Date Taking? Authorizing Provider  amLODipine (NORVASC) 5 MG tablet Take 5 mg by mouth daily.  07/26/14  Yes Historical Provider, MD  aspirin EC 81 MG tablet Take 81 mg by mouth daily.   Yes Historical Provider, MD  atorvastatin (LIPITOR) 40 MG tablet TAKE 1 TABLET BY MOUTH DAILY AT 6 PM 08/14/14  Yes Troy Sine, MD  calcium acetate (PHOSLO) 667 MG tablet Take 2,001 mg by mouth 3 (three) times daily.  01/24/13  Yes Historical Provider, MD  carvedilol (COREG) 6.25 MG tablet TAKE 1 TABLET BY MOUTH TWICE DAILY WITH MEALS 08/14/14  Yes Troy Sine, MD  cinacalcet (SENSIPAR) 60 MG tablet Take 60 mg by mouth daily.   Yes Historical Provider, MD  clopidogrel (PLAVIX) 75 MG tablet Take 1 tablet (75 mg total) by mouth daily with breakfast. 01/14/14  Yes Lendon Colonel, NP  COLCRYS 0.6 MG tablet Take 0.6 mg by mouth daily.  01/04/13  Yes Historical Provider, MD  HYDROmorphone (DILAUDID) 2 MG tablet Take 1 to 2 tablets by mouth every 6 hours as needed for postoperative pain. Patient taking differently: Take 1-2 mg by mouth every 6 (six) hours as needed (pain).  08/07/14  Yes Serafina Mitchell, MD  LIDOCAINE EX Apply 1 application topically See admin instructions. Apply to inject site on dialysis days (Tues, Thur, Sat) if needed for pain   Yes Historical Provider, MD  loperamide (IMODIUM A-D) 2 MG tablet Take 2 mg by mouth daily as needed for diarrhea or loose stools.  08/13/12  Yes Erroll Luna, MD  nitroGLYCERIN (NITRODUR - DOSED IN MG/24 HR) 0.3 mg/hr patch Place 1 patch (0.3 mg total) onto the skin daily. 01/14/14  Yes  Lendon Colonel, NP  oxyCODONE (ROXICODONE) 5 MG immediate release tablet Take 1 tablet (5 mg total) by mouth every 4 (four) hours as needed for severe pain. 08/02/14  Yes Serafina Mitchell, MD  pantoprazole (PROTONIX) 40 MG tablet Take 1 tablet (40 mg total) by mouth daily. 01/14/14  Yes Lendon Colonel, NP  predniSONE (DELTASONE) 5 MG tablet Take 5 mg by mouth daily.   Yes Historical Provider, MD  multivitamin (RENA-VIT) TABS tablet Take 1 tablet by mouth daily. 07/05/12   Rosalia Hammers, MD  Review of Systems Constitutional: negative for chills, fatigue, fevers and sweats Ears, nose,  mouth, throat, and face: negative for earaches, hoarseness, nasal congestion and sore throat Respiratory: negative for cough, dyspnea on exertion, hemoptysis and sputum Cardiovascular: negative for chest pain, chest pressure/discomfort, dyspnea, orthopnea and palpitations Gastrointestinal: negative for abdominal pain, change in bowel habits, nausea and vomiting Genitourinary:negative, anuric Musculoskeletal: positive for right foot pain, especially with weight-bearing; negative for arthralgias, back pain, myalgias and neck pain Neurological: negative for dizziness, headaches, paresthesia, speech problems and weakness  Physical Exam: Filed Vitals:   08/16/14 1253  BP: 155/21  Pulse: 86  Temp: 98.7 F (37.1 C)     General appearance: alert, cooperative and no distress Head: Normocephalic, without obvious abnormality, atraumatic Neck: no adenopathy, no carotid bruit, no JVD and supple, symmetrical, trachea midline Resp: clear to auscultation bilaterally Cardio: regular rate and rhythm, S1, S2 normal, no murmur, click, rub or gallop GI: soft, non-tender; bowel sounds normal; midline surgical scar, no organomegaly Extremities: R foot with darkened area medially and dorsally and decreased temperature from mid-foot distally; left lower leg swollen and warm, but with dry gangrene of  great toe on the  left Neurologic: Grossly normal Dialysis Access:  Left thigh AVG with + bruit   Labs:  Results for orders placed or performed during the hospital encounter of 08/15/14 (from the past 48 hour(s))  I-STAT, chem 8     Status: Abnormal   Collection Time: 08/15/14  7:59 AM  Result Value Ref Range   Sodium 136 (L) 137 - 147 mEq/L   Potassium 4.5 3.7 - 5.3 mEq/L   Chloride 94 (L) 96 - 112 mEq/L   BUN 37 (H) 6 - 23 mg/dL   Creatinine, Ser 6.30 (H) 0.50 - 1.35 mg/dL   Glucose, Bld 75 70 - 99 mg/dL   Calcium, Ion 0.96 (L) 1.12 - 1.23 mmol/L   TCO2 33 0 - 100 mmol/L   Hemoglobin 12.2 (L) 13.0 - 17.0 g/dL   HCT 36.0 (L) 39.0 - 52.0 %    Dialysis Orders:   TTS @ [facility] 3:30     46.5 kg     2K/2Ca       400/800       Profile 2      Heparin 4000 U        L thigh AVG  Calcitriol 2.75 mcg         Aranesp 25 mcg on Thurs    Venofer 100 mg through 12/3, then qwk  Assessment/Plan: 1. PVD - s/p balloon angioplasty L popliteal artery 10/28 & R popliteal artery 11/11, s/p R leg runoff yesterday per Dr. Bridgett Larsson, showed occluded popliteal artery, intervention pending. 2. ESRD - HD on TTS @ [facility], K 4.5.  HD pending today. 3. Hypertension/volume - BP 155/21 on Amlodipine 10 mg qhs, Carvedilol 6.25 mg bid; only 1 L over EDW. 4. Anemia - Hgb 10.1, Aranesp 25 mcg on Thurs (today per holiday schedule), Venofer loading through 66/0. 5. Metabolic bone disease - Last corrected Ca 9.2, P 8.1, iPTH 713; Calcitriol 2.75 mcg, Sensipar 60 mg qd, Phoslo 4 with meals. 6. Nutrition - Last Alb 3.4, renal diet, vitamin. 7. Hx CAD - s/p cath w/ PCI 12/2013. 8. Hepatitis C 9. Renal transplant - 1987, failed in 2004.  [name],CHARLES 08/16/2014, 2:20 PM   Attending Nephrologist: Jamal Maes, MD  I have seen and examined this patient and agree with plan and assessment in the above note of CLyles.  Admitted for pain control and intervention for ischemic right foot. For HD today on  holiday schedule. Taygan Connell B,MD 08/16/2014  5:12 PM

## 2014-08-16 NOTE — Procedures (Signed)
Pt seen on HD.  Ap 170 Vp 230  BFR 400.  SBP 116.

## 2014-08-16 NOTE — Progress Notes (Signed)
ANTICOAGULATION CONSULT NOTE - Follow Up Consult  Pharmacy Consult for heparin Indication: occluded R popliteal artery   Labs:  Recent Labs  08/15/14 0759 08/16/14 1520 08/16/14 2328  HGB 12.2* 10.1*  --   HCT 36.0* 31.2*  --   PLT  --  172  --   LABPROT  --  15.1  --   INR  --  1.18  --   HEPARINUNFRC  --   --  0.46  CREATININE 6.30* 9.43*  --       Assessment/Plan:  53yo male therapeutic on heparin with initial dosing for occluded artery. Will continue gtt at current rate and confirm stable with am labs.   Wynona Neat, PharmD, BCPS  08/16/2014,11:55 PM

## 2014-08-16 NOTE — Telephone Encounter (Signed)
Phone call from pt.  Reported that his right foot is very painful and he isn't able to rest.  Reported the pain continues in the sole, the top and the side of the right foot.  Stated he gets a little relief from the Dilaudid, but only for a short amt. Of time.  Stated he doesn't feel the procedure, yesterday, really helped.  Explained that the procedure 11/24 was a diagnostic test only to determine next step in treatment.  Discussed pt's pain with Dr. Trula Slade.  Advised to offer to admit to hospital for pain control, and possibly do right lower extremity bypass surgery sooner.  Called pt.  Gave pt. Recommendation per Dr. Trula Slade.  Agreed to be admitted to the hospital for pain management. Bed Placement notified.  Pt. Instructed to go to [facility] to the Admitting Dept.  Verb. Understanding.

## 2014-08-17 DIAGNOSIS — Z01810 Encounter for preprocedural cardiovascular examination: Secondary | ICD-10-CM

## 2014-08-17 LAB — CBC
HCT: 34.2 % — ABNORMAL LOW (ref 39.0–52.0)
Hemoglobin: 10.8 g/dL — ABNORMAL LOW (ref 13.0–17.0)
MCH: 27.8 pg (ref 26.0–34.0)
MCHC: 31.6 g/dL (ref 30.0–36.0)
MCV: 88.1 fL (ref 78.0–100.0)
PLATELETS: 234 10*3/uL (ref 150–400)
RBC: 3.88 MIL/uL — ABNORMAL LOW (ref 4.22–5.81)
RDW: 15.8 % — AB (ref 11.5–15.5)
WBC: 10.9 10*3/uL — ABNORMAL HIGH (ref 4.0–10.5)

## 2014-08-17 LAB — HEPARIN LEVEL (UNFRACTIONATED): Heparin Unfractionated: 0.3 IU/mL (ref 0.30–0.70)

## 2014-08-17 MED ORDER — COLCHICINE 0.6 MG PO TABS
0.3000 mg | ORAL_TABLET | ORAL | Status: DC
  Administered 2014-08-21 – 2014-09-04 (×4): 0.3 mg via ORAL
  Filled 2014-08-17 (×8): qty 0.5

## 2014-08-17 MED ORDER — NEPRO/CARBSTEADY PO LIQD
237.0000 mL | Freq: Two times a day (BID) | ORAL | Status: DC
  Filled 2014-08-17 (×20): qty 237

## 2014-08-17 MED ORDER — DARBEPOETIN ALFA 25 MCG/0.42ML IJ SOSY: 25.0000 ug | PREFILLED_SYRINGE | INTRAMUSCULAR | Status: DC

## 2014-08-17 NOTE — Progress Notes (Signed)
   Daily Progress Note  Assessment/Planning: R leg ischemia, R Pop occlusion   Pt admitted for pain control from R pop occlusion  Pt scheduled for R fem-pop BPG w/ Dr. Trula Slade tomorrow if OR schedule allows for such  Subjective    R leg hurts  Objective Filed Vitals:   08/16/14 2206 08/16/14 2258 08/17/14 0530 08/17/14 0638  BP: 147/65 107/85 92/51 106/43  Pulse: 100 100 88 91  Temp: 97.8 F (36.6 C) 98.3 F (36.8 C) 99.6 F (37.6 C)   TempSrc: Oral Oral Oral   Resp: 19 18 18    Height:      Weight: 102 lb 4.7 oz (46.4 kg)  101 lb 3.1 oz (45.9 kg)   SpO2: 97% 99% 96%     Intake/Output Summary (Last 24 hours) at 08/17/14 0818 Last data filed at 08/16/14 1440  Gross per 24 hour  Intake    240 ml  Output      0 ml  Net    240 ml    PULM  CTAB CV  RRR GI  soft, NTND VASC  R foot remains unchanged, no frank gangrene, no palpable pulses  Laboratory CBC    Component Value Date/Time   WBC 10.9* 08/17/2014 0344   HGB 10.8* 08/17/2014 0344   HCT 34.2* 08/17/2014 0344   PLT 234 08/17/2014 0344    BMET    Component Value Date/Time   NA 135* 08/16/2014 1520   K 4.5 08/16/2014 1520   CL 88* 08/16/2014 1520   CO2 22 08/16/2014 1520   GLUCOSE 107* 08/16/2014 1520   BUN 59* 08/16/2014 1520   CREATININE 9.43* 08/16/2014 1520   CREATININE 7.80* 01/16/2014 1437   CALCIUM 8.5 08/16/2014 1520   GFRNONAA 5* 08/16/2014 1520   GFRAA 6* 08/16/2014 Powell, MD [facility]: [phone] Pager: [phone]  08/17/2014, 8:18 AM

## 2014-08-17 NOTE — Plan of Care (Signed)
Problem: Phase I Progression Outcomes Goal: Pain controlled with appropriate interventions Outcome: Completed/Met Date Met:  08/17/14 Goal: Initial discharge plan identified Outcome: Completed/Met Date Met:  08/17/14     

## 2014-08-17 NOTE — Progress Notes (Addendum)
Right Lower Extremity Vein Map    Right Great Saphenous Vein   Segment Diameter Comment  1. Origin 5.28mm   2. High Thigh 2.79mm   3. Mid Thigh 3.66mm   4. Low Thigh 3.81mm   5. At Knee 2.69mm   6. High Calf 3.64mm   7. Low Calf 3.51mm   8. Ankle 3.68mm                  Left Lower Extremity Vein Map    Left Great Saphenous Vein   Segment Diameter Comment  1. Origin 6.66mm   2. High Thigh 3.79mm   3. Mid Thigh 3.40mm Branch  4. Low Thigh 3.82mm   5. At Knee 2.59mm   6. High Calf 3.1mm   7. Low Calf 3.53mm Branch  8. Ankle 3.63mm                08/17/2014 8:49 AM Maudry Mayhew, RVT, RDCS, RDMS

## 2014-08-17 NOTE — Plan of Care (Signed)
Problem: Phase I Progression Outcomes Goal: Distal pulses equal to baseline Outcome: Completed/Met Date Met:  08/17/14 Goal: Hemodynamically stable Outcome: Completed/Met Date Met:  08/17/14

## 2014-08-17 NOTE — Progress Notes (Signed)
ANTICOAGULATION CONSULT NOTE - Follow Up Consult  Pharmacy Consult for Heparin Indication: occluded R popliteal artery  Allergies  Allergen Reactions  . Allopurinol Other (See Comments)    REACTION: decreased platelets    Patient Measurements: Height: 5\' 11"  (180.3 cm) Weight: 101 lb 3.1 oz (45.9 kg) IBW/kg (Calculated) : 75.3 Heparin Dosing Weight: 47.6kg  Vital Signs: Temp: 99.6 F (37.6 C) (11/26 0530) Temp Source: Oral (11/26 0530) BP: 106/43 mmHg (11/26 0638) Pulse Rate: 91 (11/26 0638)  Labs:  Recent Labs  08/15/14 0759 08/16/14 1520 08/16/14 2328 08/17/14 0343 08/17/14 0344  HGB 12.2* 10.1*  --   --  10.8*  HCT 36.0* 31.2*  --   --  34.2*  PLT  --  172  --   --  234  LABPROT  --  15.1  --   --   --   INR  --  1.18  --   --   --   HEPARINUNFRC  --   --  0.46 0.30  --   CREATININE 6.30* 9.43*  --   --   --     Estimated Creatinine Clearance: 5.7 mL/min (by C-G formula based on Cr of 9.43).   Medications:  Heparin 700 units/hr  Assessment: 53yom continues on heparin for occluded R popliteal artery. Heparin level (0.3) remains therapeutic but trended down to lower end of goal range - will increase slightly to keep therapeutic and follow-up AM labs. - H/H and Plts improving - No significant bleeding reported  Goal of Therapy:  Heparin level 0.3-0.7 units/ml Monitor platelets by anticoagulation protocol: Yes   Plan:  1. Increase heparin drip to 750 units/hr (7.5 ml/hr) 2. Monitor daily heparin level and CBC 3. Follow-up [facility] plans  Earleen Newport  [phone] 08/17/2014,7:51 AM

## 2014-08-17 NOTE — Progress Notes (Signed)
[facility] Rounding Note Subjective:  Foot pain controlled as long as gets pain meds Right foot dorsum , toes and bottom of foot distally most painful No word yet as to nature of any planned intervention Tolerated dialysis fine last night and got to EDW   Objective Vital signs in last 24 hours: Filed Vitals:   08/16/14 2206 08/16/14 2258 08/17/14 0530 08/17/14 0638  BP: 147/65 107/85 92/51 106/43  Pulse: 100 100 88 91  Temp: 97.8 F (36.6 C) 98.3 F (36.8 C) 99.6 F (37.6 C)   TempSrc: Oral Oral Oral   Resp: 19 18 18    Height:      Weight: 46.4 kg (102 lb 4.7 oz)  45.9 kg (101 lb 3.1 oz)   SpO2: 97% 99% 96%    Weight change:   47.5->46.4 with HD 11/25 (EDW 46.5 kg)  Intake/Output Summary (Last 24 hours) at 08/17/14 0748 Last data filed at 08/16/14 1440  Gross per 24 hour  Intake    240 ml  Output      0 ml  Net    240 ml    Physical Exam: BP 106/43 mmHg  Pulse 91  Temp(Src) 99.6 F (37.6 C) (Oral)  Resp 18  Ht 5\' 11"  (1.803 m)  Wt 45.9 kg (101 lb 3.1 oz)  BMI 14.12 kg/m2  SpO2 96% General appearance: alert, cooperative and no distress Neck: No JVD  Resp: clear to auscultation bilaterally Cardio: regular rate and rhythm, S1, S2 normal, no murmur, click, rub or gallop GI: soft, non-tender; bowel sounds normal; midline surgical scar, no organomegaly Extremities: R foot with darkened area medially and dorsally and decreased temperature from mid-foot distally; left lower leg/foot mostly is swollen and warm, but with 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 Mild odor noted Neurologic: Grossly normal Dialysis Access: Left thigh AVG with + bruit   Labs: Basic Metabolic Panel:  Recent Labs Lab 08/13/14 1051 08/15/14 0759 08/16/14 1520  NA 144 136* 135*  K 3.9 4.5 4.5  CL 96 94* 88*  CO2 30  --  22  GLUCOSE 84 75 107*  BUN 32* 37* 59*  CREATININE 6.46* 6.30* 9.43*  CALCIUM 8.8  --  8.5   Liver Function Tests:  Recent Labs Lab  08/16/14 1520  AST 30  ALT 23  ALKPHOS 139*  BILITOT 0.7  PROT 7.0  ALBUMIN 2.6*    Recent Labs Lab 08/13/14 1051 08/15/14 0759 08/16/14 1520 08/17/14 0344  WBC 7.2  --  10.0 10.9*  HGB 11.5* 12.2* 10.1* 10.8*  HCT 37.3* 36.0* 31.2* 34.2*  MCV 91.2  --  86.4 88.1  PLT 202  --  172 234   Scheduled Medications: . sodium chloride 10 mL/hr (08/17/14 0043)  . heparin 700 Units/hr (08/16/14 1511)   . amLODipine  5 mg Oral Daily  . aspirin EC  81 mg Oral Daily  . atorvastatin  40 mg Oral q1800  . calcitRIOL  2.75 mcg Oral Once  . [START ON 08/19/2014] calcitRIOL  2.75 mcg Oral Q T,Th,Sa-HD  . calcium acetate  2,001 mg Oral TID WC  . carvedilol  6.25 mg Oral BID WC  . cinacalcet  60 mg Oral Daily  . colchicine  0.6 mg Oral Daily  . [START ON 08/24/2014] darbepoetin (ARANESP) injection - DIALYSIS  25 mcg Intravenous Q Thu-HD  . docusate sodium  100 mg Oral BID  . [START ON 08/19/2014] ferric gluconate (FERRLECIT/NULECIT) IV  125 mg Intravenous Q T,Th,Sa-HD  .  multivitamin  1 tablet Oral QHS  . nitroGLYCERIN  0.3 mg Transdermal Daily  . pantoprazole  40 mg Oral Daily  . potassium chloride  20-40 mEq Oral Once  . predniSONE  5 mg Oral Daily   Dialysis Orders: TTS @ [facility] 3:30 46.5 kg 2K/2Ca 400/800 Profile 2 Heparin 4000 U L thigh AVG  Calcitriol 2.75 mcg Aranesp 25 mcg on Thurs Venofer 100 mg through 12/3, then qwk  Background 53 y.o. AA male with a history of peripheral vascular disease, CAD s/p PCI in 12/2013, CHF, Hepatitis C, small bowel obstruction s/p exploratory laparotomy in 03/2012, and ESRD on dialysis at the [facility] who had drug-coated balloon angioplasty of the right popliteal artery per Dr. Trula Slade on 11/11, but returned to the ED 11/22 with worsening right foot pain and was found to have significant plaque with reduced flows throughout the right lower extremity, similar to previous studies at [facility], but was  scheduled for right lower extremity angiogram 11/24 per Dr. Bridgett Larsson. Right leg runoff showed heavily calcified right arterial system with multiple degrees of stenosis, including an occluded right popliteal artery. Patient was unable to stand and thus unable to attend dialysis 11/25 secondary to severe right foot pain and was admitted to the hospital for pain management/IV heparin/possible intervention   Assessment/Plan: 1. PVD - s/p balloon angioplasty L popliteal artery 10/28 & R popliteal artery 11/11, s/p R leg runoff 11/24 per Dr. Bridgett Larsson, showed occluded popliteal artery, intervention pending. Having severe rest pain in the right foot, controlled with pain meds at present 2. ESRD - HD on TTS @ [facility], K 4.5. HD 11/25 on Holiday schedule - next treatment will be on Saturday 3. Hypertension/volume - Amlodipine 10 mg qhs, Carvedilol 6.25 mg bid; at EDW post HD 11/25 4. Anemia - Hgb 10.8, Aranesp 25 mcg on Thurs (today per holiday schedule), Venofer loading through 92/3. 5. Metabolic bone disease - Last corrected Ca 9.2, P 8.1, iPTH 713; Calcitriol 2.75 mcg, Sensipar 60 mg qd, Phoslo 4 with meals. 6. Nutrition - Last Alb 2.6, renal diet, vitamin. 7. Hx CAD - s/p cath w/ PCI 12/2013 (RCA rotablator PCI and DES X3). No chest pain 8. Gout - on colchicine. Not currently bothering him  Jamal Maes, MD [facility] [phone] pager 08/17/2014, 7:48 AM

## 2014-08-18 ENCOUNTER — Inpatient Hospital Stay (HOSPITAL_COMMUNITY): Payer: Medicare Other | Admitting: Certified Registered"

## 2014-08-18 ENCOUNTER — Encounter (HOSPITAL_COMMUNITY): Payer: Self-pay | Admitting: Certified Registered"

## 2014-08-18 ENCOUNTER — Encounter (HOSPITAL_COMMUNITY)
Admission: AD | Disposition: A | Discharge status: Home Health | Payer: Self-pay | Source: Ambulatory Visit | Attending: Surgery

## 2014-08-18 DIAGNOSIS — I2584 Coronary atherosclerosis due to calcified coronary lesion: Secondary | ICD-10-CM

## 2014-08-18 DIAGNOSIS — I70221 Atherosclerosis of native arteries of extremities with rest pain, right leg: Secondary | ICD-10-CM

## 2014-08-18 DIAGNOSIS — I739 Peripheral vascular disease, unspecified: Principal | ICD-10-CM

## 2014-08-18 DIAGNOSIS — Z01810 Encounter for preprocedural cardiovascular examination: Secondary | ICD-10-CM

## 2014-08-18 DIAGNOSIS — I2510 Atherosclerotic heart disease of native coronary artery without angina pectoris: Secondary | ICD-10-CM

## 2014-08-18 HISTORY — PX: FEMORAL-POPLITEAL BYPASS GRAFT: SHX937

## 2014-08-18 LAB — BASIC METABOLIC PANEL
Anion gap: 18 — ABNORMAL HIGH (ref 5–15)
BUN: 40 mg/dL — AB (ref 6–23)
CO2: 25 mEq/L (ref 19–32)
Calcium: 8.8 mg/dL (ref 8.4–10.5)
Chloride: 93 mEq/L — ABNORMAL LOW (ref 96–112)
Creatinine, Ser: 8.4 mg/dL — ABNORMAL HIGH (ref 0.50–1.35)
GFR, EST AFRICAN AMERICAN: 7 mL/min — AB (ref >90)
GFR, EST NON AFRICAN AMERICAN: 6 mL/min — AB (ref >90)
Glucose, Bld: 121 mg/dL — ABNORMAL HIGH (ref 70–99)
POTASSIUM: 4.3 meq/L (ref 3.7–5.3)
Sodium: 136 mEq/L — ABNORMAL LOW (ref 137–147)

## 2014-08-18 LAB — POCT I-STAT 4, (NA,K, GLUC, HGB,HCT)
GLUCOSE: 96 mg/dL (ref 70–99)
HCT: 27 % — ABNORMAL LOW (ref 39.0–52.0)
Hemoglobin: 9.2 g/dL — ABNORMAL LOW (ref 13.0–17.0)
POTASSIUM: 3.6 meq/L — AB (ref 3.7–5.3)
Sodium: 132 mEq/L — ABNORMAL LOW (ref 137–147)

## 2014-08-18 LAB — CBC
HEMATOCRIT: 31.2 % — AB (ref 39.0–52.0)
HEMOGLOBIN: 9.7 g/dL — AB (ref 13.0–17.0)
MCH: 27 pg (ref 26.0–34.0)
MCHC: 31.1 g/dL (ref 30.0–36.0)
MCV: 86.9 fL (ref 78.0–100.0)
Platelets: 213 10*3/uL (ref 150–400)
RBC: 3.59 MIL/uL — ABNORMAL LOW (ref 4.22–5.81)
RDW: 15.7 % — ABNORMAL HIGH (ref 11.5–15.5)
WBC: 9.9 10*3/uL (ref 4.0–10.5)

## 2014-08-18 LAB — HEPARIN LEVEL (UNFRACTIONATED): HEPARIN UNFRACTIONATED: 0.38 [IU]/mL (ref 0.30–0.70)

## 2014-08-18 SURGERY — BYPASS GRAFT FEMORAL-POPLITEAL ARTERY
Anesthesia: General | Site: Leg Upper | Laterality: Right

## 2014-08-18 MED ORDER — PROPOFOL 10 MG/ML IV BOLUS
INTRAVENOUS | Status: DC | PRN
  Administered 2014-08-18: 130 mg via INTRAVENOUS

## 2014-08-18 MED ORDER — GLYCOPYRROLATE 0.2 MG/ML IJ SOLN
INTRAMUSCULAR | Status: DC | PRN
  Administered 2014-08-18: 0.3 mg via INTRAVENOUS

## 2014-08-18 MED ORDER — GLYCOPYRROLATE 0.2 MG/ML IJ SOLN
INTRAMUSCULAR | Status: AC
  Filled 2014-08-18: qty 2

## 2014-08-18 MED ORDER — ARTIFICIAL TEARS OP OINT
TOPICAL_OINTMENT | OPHTHALMIC | Status: DC | PRN
  Administered 2014-08-18: 1 via OPHTHALMIC

## 2014-08-18 MED ORDER — CARVEDILOL 6.25 MG PO TABS
6.2500 mg | ORAL_TABLET | Freq: Two times a day (BID) | ORAL | Status: DC
  Administered 2014-08-19 – 2014-08-25 (×10): 6.25 mg via ORAL
  Filled 2014-08-18 (×18): qty 1

## 2014-08-18 MED ORDER — OXYCODONE HCL 5 MG/5ML PO SOLN: 5.0000 mg | Freq: Once | ORAL | Status: DC | PRN

## 2014-08-18 MED ORDER — MORPHINE SULFATE 2 MG/ML IJ SOLN
2.0000 mg | INTRAMUSCULAR | Status: DC | PRN
  Administered 2014-08-18 – 2014-08-21 (×7): 2 mg via INTRAVENOUS
  Administered 2014-08-21: 3 mg via INTRAVENOUS
  Administered 2014-08-21 – 2014-09-02 (×22): 2 mg via INTRAVENOUS
  Administered 2014-09-02: 3 mg via INTRAVENOUS
  Administered 2014-09-02 – 2014-09-04 (×4): 2 mg via INTRAVENOUS
  Administered 2014-09-05: 3 mg via INTRAVENOUS
  Filled 2014-08-18: qty 1
  Filled 2014-08-18 (×2): qty 2
  Filled 2014-08-18 (×2): qty 1
  Filled 2014-08-18: qty 2
  Filled 2014-08-18 (×21): qty 1
  Filled 2014-08-18: qty 2
  Filled 2014-08-18 (×6): qty 1

## 2014-08-18 MED ORDER — LIDOCAINE HCL (CARDIAC) 20 MG/ML IV SOLN
INTRAVENOUS | Status: DC | PRN
  Administered 2014-08-18: 60 mg via INTRAVENOUS

## 2014-08-18 MED ORDER — SODIUM CHLORIDE 0.9 % IR SOLN
Status: DC | PRN
  Administered 2014-08-18: 12:00:00

## 2014-08-18 MED ORDER — ROCURONIUM BROMIDE 100 MG/10ML IV SOLN
INTRAVENOUS | Status: DC | PRN
  Administered 2014-08-18: 40 mg via INTRAVENOUS

## 2014-08-18 MED ORDER — SODIUM CHLORIDE 0.9 % IV SOLN: Freq: Once | INTRAVENOUS | Status: DC

## 2014-08-18 MED ORDER — PROPOFOL 10 MG/ML IV BOLUS
INTRAVENOUS | Status: AC
  Filled 2014-08-18: qty 20

## 2014-08-18 MED ORDER — ONDANSETRON HCL 4 MG/2ML IJ SOLN
INTRAMUSCULAR | Status: DC | PRN
  Administered 2014-08-18: 4 mg via INTRAVENOUS

## 2014-08-18 MED ORDER — CEFAZOLIN SODIUM-DEXTROSE 2-3 GM-% IV SOLR
INTRAVENOUS | Status: DC | PRN
  Administered 2014-08-18: 2 g via INTRAVENOUS

## 2014-08-18 MED ORDER — FENTANYL CITRATE 0.05 MG/ML IJ SOLN
INTRAMUSCULAR | Status: DC | PRN
  Administered 2014-08-18: 100 ug via INTRAVENOUS
  Administered 2014-08-18 (×3): 50 ug via INTRAVENOUS

## 2014-08-18 MED ORDER — HEPARIN SODIUM (PORCINE) 1000 UNIT/ML IJ SOLN
INTRAMUSCULAR | Status: AC
  Filled 2014-08-18: qty 1

## 2014-08-18 MED ORDER — PROTAMINE SULFATE 10 MG/ML IV SOLN
INTRAVENOUS | Status: DC | PRN
  Administered 2014-08-18: 50 mg via INTRAVENOUS

## 2014-08-18 MED ORDER — ALBUMIN HUMAN 5 % IV SOLN
INTRAVENOUS | Status: DC | PRN
  Administered 2014-08-18: 12:00:00 via INTRAVENOUS

## 2014-08-18 MED ORDER — DEXTROSE 5 % IV SOLN
1.5000 g | INTRAVENOUS | Status: AC
  Administered 2014-08-18: 1.5 g via INTRAVENOUS
  Filled 2014-08-18: qty 1.5

## 2014-08-18 MED ORDER — HYDROMORPHONE HCL 1 MG/ML IJ SOLN
0.2500 mg | INTRAMUSCULAR | Status: DC | PRN
  Administered 2014-08-18: 0.5 mg via INTRAVENOUS

## 2014-08-18 MED ORDER — HEMOSTATIC AGENTS (NO CHARGE) OPTIME
TOPICAL | Status: DC | PRN
  Administered 2014-08-18 (×3): 1 via TOPICAL

## 2014-08-18 MED ORDER — PROTAMINE SULFATE 10 MG/ML IV SOLN
INTRAVENOUS | Status: AC
  Filled 2014-08-18: qty 10

## 2014-08-18 MED ORDER — PROMETHAZINE HCL 25 MG/ML IJ SOLN: 6.2500 mg | INTRAMUSCULAR | Status: DC | PRN

## 2014-08-18 MED ORDER — OXYCODONE HCL 5 MG PO TABS: 5.0000 mg | ORAL_TABLET | Freq: Once | ORAL | Status: DC | PRN

## 2014-08-18 MED ORDER — METOPROLOL TARTRATE 1 MG/ML IV SOLN
2.0000 mg | INTRAVENOUS | Status: AC | PRN
  Administered 2014-08-30: 2.5 mg via INTRAVENOUS
  Administered 2014-08-30: 5 mg via INTRAVENOUS
  Filled 2014-08-18 (×2): qty 5

## 2014-08-18 MED ORDER — DEXTROSE 5 % IV SOLN
10.0000 mg | INTRAVENOUS | Status: DC | PRN
  Administered 2014-08-18: 10 ug/min via INTRAVENOUS

## 2014-08-18 MED ORDER — HEPARIN SODIUM (PORCINE) 1000 UNIT/ML IJ SOLN
INTRAMUSCULAR | Status: DC | PRN
  Administered 2014-08-18: 5000 [IU] via INTRAVENOUS
  Administered 2014-08-18: 1000 [IU] via INTRAVENOUS

## 2014-08-18 MED ORDER — SODIUM CHLORIDE 0.9 % IV SOLN: 500.0000 mL | Freq: Once | INTRAVENOUS | Status: AC | PRN

## 2014-08-18 MED ORDER — 0.9 % SODIUM CHLORIDE (POUR BTL) OPTIME
TOPICAL | Status: DC | PRN
  Administered 2014-08-18 (×2): 1000 mL

## 2014-08-18 MED ORDER — NEOSTIGMINE METHYLSULFATE 10 MG/10ML IV SOLN
INTRAVENOUS | Status: AC
  Filled 2014-08-18: qty 1

## 2014-08-18 MED ORDER — CLOPIDOGREL BISULFATE 75 MG PO TABS
75.0000 mg | ORAL_TABLET | Freq: Every day | ORAL | Status: DC
  Administered 2014-08-19 – 2014-08-26 (×7): 75 mg via ORAL
  Filled 2014-08-18 (×10): qty 1

## 2014-08-18 MED ORDER — HYDROMORPHONE HCL 1 MG/ML IJ SOLN
INTRAMUSCULAR | Status: AC
  Filled 2014-08-18: qty 1

## 2014-08-18 MED ORDER — DEXTROSE 5 % IV SOLN
1.5000 g | Freq: Two times a day (BID) | INTRAVENOUS | Status: DC
  Filled 2014-08-18 (×2): qty 1.5

## 2014-08-18 MED ORDER — FENTANYL CITRATE 0.05 MG/ML IJ SOLN
INTRAMUSCULAR | Status: AC
  Filled 2014-08-18: qty 5

## 2014-08-18 MED ORDER — NEOSTIGMINE METHYLSULFATE 10 MG/10ML IV SOLN
INTRAVENOUS | Status: DC | PRN
  Administered 2014-08-18: 2.5 mg via INTRAVENOUS

## 2014-08-18 SURGICAL SUPPLY — 61 items
BANDAGE ELASTIC 4 VELCRO ST LF (GAUZE/BANDAGES/DRESSINGS) IMPLANT
BANDAGE ESMARK 6X9 LF (GAUZE/BANDAGES/DRESSINGS) ×1 IMPLANT
BNDG CMPR 9X6 STRL LF SNTH (GAUZE/BANDAGES/DRESSINGS) ×2
BNDG ESMARK 6X9 LF (GAUZE/BANDAGES/DRESSINGS) ×4
CANISTER SUCTION 2500CC (MISCELLANEOUS) ×4 IMPLANT
CATH EMB 3FR 80CM (CATHETERS) ×6 IMPLANT
CATH EMB 4FR 40CM (CATHETERS) ×3 IMPLANT
CLIP TI MEDIUM 24 (CLIP) ×1 IMPLANT
CLIP TI WIDE RED SMALL 24 (CLIP) ×4 IMPLANT
CUFF TOURNIQUET SINGLE 24IN (TOURNIQUET CUFF) IMPLANT
CUFF TOURNIQUET SINGLE 34IN LL (TOURNIQUET CUFF) IMPLANT
CUFF TOURNIQUET SINGLE 44IN (TOURNIQUET CUFF) IMPLANT
DRAIN CHANNEL 15F RND FF W/TCR (WOUND CARE) IMPLANT
DRAPE X-RAY CASS 24X20 (DRAPES) IMPLANT
DRSG COVADERM 4X10 (GAUZE/BANDAGES/DRESSINGS) IMPLANT
DRSG COVADERM 4X8 (GAUZE/BANDAGES/DRESSINGS) IMPLANT
ELECT REM PT RETURN 9FT ADLT (ELECTROSURGICAL) ×4
ELECTRODE REM PT RTRN 9FT ADLT (ELECTROSURGICAL) ×2 IMPLANT
EVACUATOR SILICONE 100CC (DRAIN) IMPLANT
GLOVE BIOGEL PI IND STRL 7.5 (GLOVE) ×2 IMPLANT
GLOVE BIOGEL PI INDICATOR 7.5 (GLOVE) ×2
GLOVE SURG SS PI 7.5 STRL IVOR (GLOVE) ×7 IMPLANT
GOWN STRL REUS W/ TWL LRG LVL3 (GOWN DISPOSABLE) ×5 IMPLANT
GOWN STRL REUS W/ TWL XL LVL3 (GOWN DISPOSABLE) ×2 IMPLANT
GOWN STRL REUS W/TWL LRG LVL3 (GOWN DISPOSABLE) ×12
GOWN STRL REUS W/TWL XL LVL3 (GOWN DISPOSABLE) ×4
GRAFT PROPATEN W/RING 6X80X60 (Vascular Products) ×3 IMPLANT
HEMOSTAT SNOW SURGICEL 2X4 (HEMOSTASIS) ×6 IMPLANT
KIT BASIN OR (CUSTOM PROCEDURE TRAY) ×4 IMPLANT
KIT ROOM TURNOVER OR (KITS) ×4 IMPLANT
LIQUID BAND (GAUZE/BANDAGES/DRESSINGS) ×4 IMPLANT
MARKER GRAFT CORONARY BYPASS (MISCELLANEOUS) IMPLANT
NS IRRIG 1000ML POUR BTL (IV SOLUTION) ×8 IMPLANT
PACK PERIPHERAL VASCULAR (CUSTOM PROCEDURE TRAY) ×4 IMPLANT
PAD ARMBOARD 7.5X6 YLW CONV (MISCELLANEOUS) ×8 IMPLANT
PADDING CAST COTTON 6X4 STRL (CAST SUPPLIES) IMPLANT
PROBE PENCIL 8 MHZ STRL DISP (MISCELLANEOUS) IMPLANT
SEALANT SURG COSEAL 8ML (VASCULAR PRODUCTS) ×3 IMPLANT
SET COLLECT BLD 21X3/4 12 (NEEDLE) IMPLANT
SPONGE LAP 18X18 X RAY DECT (DISPOSABLE) ×6 IMPLANT
STOPCOCK 4 WAY LG BORE MALE ST (IV SETS) ×3 IMPLANT
SUT ETHILON 3 0 PS 1 (SUTURE) IMPLANT
SUT GORETEX 6 0 TT 9 (SUTURE) ×3 IMPLANT
SUT GORETEX 6.0 TT13 (SUTURE) ×3 IMPLANT
SUT PROLENE 5 0 C 1 24 (SUTURE) ×22 IMPLANT
SUT PROLENE 6 0 BV (SUTURE) ×13 IMPLANT
SUT PROLENE 7 0 BV 1 (SUTURE) IMPLANT
SUT SILK 2 0 SH (SUTURE) ×4 IMPLANT
SUT SILK 3 0 (SUTURE)
SUT SILK 3-0 18XBRD TIE 12 (SUTURE) IMPLANT
SUT VIC AB 2-0 CT1 27 (SUTURE) ×8
SUT VIC AB 2-0 CT1 TAPERPNT 27 (SUTURE) ×4 IMPLANT
SUT VIC AB 3-0 SH 27 (SUTURE) ×8
SUT VIC AB 3-0 SH 27X BRD (SUTURE) ×4 IMPLANT
SUT VICRYL 4-0 PS2 18IN ABS (SUTURE) ×6 IMPLANT
SYR 3ML LL SCALE MARK (SYRINGE) ×3 IMPLANT
TRAY FOLEY CATH 16FRSI W/METER (SET/KITS/TRAYS/PACK) ×1 IMPLANT
TUBE SUCTION CARDIAC 10FR (CANNULA) ×3 IMPLANT
TUBING EXTENTION W/L.L. (IV SETS) IMPLANT
UNDERPAD 30X30 INCONTINENT (UNDERPADS AND DIAPERS) ×4 IMPLANT
WATER STERILE IRR 1000ML POUR (IV SOLUTION) ×4 IMPLANT

## 2014-08-18 NOTE — Anesthesia Preprocedure Evaluation (Addendum)
Anesthesia Evaluation  Patient identified by MRN, date of birth, ID band Patient awake    Reviewed: Allergy & Precautions, H&P , NPO status , Patient's Chart, lab work & pertinent test results  History of Anesthesia Complications Negative for: history of anesthetic complications  Airway Mallampati: I  TM Distance: >3 FB Neck ROM: Full    Dental  (+) Teeth Intact, Dental Advisory Given   Pulmonary former smoker,          Cardiovascular hypertension, + angina + CAD, + Cardiac Stents, + Peripheral Vascular Disease and +CHF Rhythm:Regular Rate:Normal  EF > 45%   Neuro/Psych    GI/Hepatic hiatal hernia, GERD-  ,(+) Hepatitis -  Endo/Other    Renal/GU ESRFRenal disease     Musculoskeletal  (+) Arthritis -,   Abdominal   Peds  Hematology  (+) anemia ,   Anesthesia Other Findings   Reproductive/Obstetrics                            Anesthesia Physical Anesthesia Plan  ASA: IV  Anesthesia Plan: General   Post-op Pain Management:    Induction: Intravenous  Airway Management Planned: Oral ETT  Additional Equipment: Arterial line  Intra-op Plan:   Post-operative Plan: Extubation in OR  Informed Consent: I have reviewed the patients History and Physical, chart, labs and discussed the procedure including the risks, benefits and alternatives for the proposed anesthesia with the patient or authorized representative who has indicated his/her understanding and acceptance.   Dental advisory given  Plan Discussed with: CRNA and Surgeon  Anesthesia Plan Comments:         Anesthesia Quick Evaluation

## 2014-08-18 NOTE — Progress Notes (Signed)
    Subjective  -   Admitted with rest pain in right leg following recent percutaneous intervention.  On heparin drip.  States pain comes and goes. No chest pain No breathing trouble Had HD on Wednesday, scheduled for Saturday   Physical Exam:  Normal motor and sensory and motor function to right leg Palpable femoral pulse Non-labored breathing CV:  RRR       Assessment/Plan:    Continue with heparin drip to help with rest pain Needs pain control with IV narcotics Cardiology to see this am for surgical clearance Plan fem BK pop +/- vein.  Plavix on hold until surgery.  Will try to do bypass today if cardiology does not feel any additional testing is required, otherwise, willplan for Tuesday  BRABHAM IV, V. WELLS 08/18/2014 8:04 AM --  Filed Vitals:   08/18/14 0512  BP: 130/53  Pulse: 82  Temp: 98.7 F (37.1 C)  Resp: 18    Intake/Output Summary (Last 24 hours) at 08/18/14 0804 Last data filed at 08/17/14 1830  Gross per 24 hour  Intake 762.44 ml  Output      0 ml  Net 762.44 ml     Laboratory CBC    Component Value Date/Time   WBC 9.9 08/18/2014 0348   HGB 9.7* 08/18/2014 0348   HCT 31.2* 08/18/2014 0348   PLT 213 08/18/2014 0348    BMET    Component Value Date/Time   NA 136* 08/18/2014 0348   K 4.3 08/18/2014 0348   CL 93* 08/18/2014 0348   CO2 25 08/18/2014 0348   GLUCOSE 121* 08/18/2014 0348   BUN 40* 08/18/2014 0348   CREATININE 8.40* 08/18/2014 0348   CREATININE 7.80* 01/16/2014 1437   CALCIUM 8.8 08/18/2014 0348   GFRNONAA 6* 08/18/2014 0348   GFRAA 7* 08/18/2014 0348    COAG Lab Results  Component Value Date   INR 1.18 08/16/2014   INR 1.11 07/20/2014   INR 0.96 01/12/2014   No results found for: PTT  Antibiotics Anti-infectives    None       V. Leia Alf, M.D. [facility] Office: [phone] Pager:  [phone]

## 2014-08-18 NOTE — Transfer of Care (Signed)
Immediate Anesthesia Transfer of Care Note  Patient: [name]  Procedure(s) Performed: Procedure(s): BYPASS GRAFT FEMORAL-POPLITEAL ARTERY with Gortex Graft (Right)  Patient Location: PACU  Anesthesia Type:General  Level of Consciousness: awake, oriented, sedated, patient cooperative and responds to stimulation  Airway & Oxygen Therapy: Patient Spontanous Breathing and Patient connected to nasal cannula oxygen  Post-op Assessment: Report given to PACU RN, Post -op Vital signs reviewed and stable, Patient moving all extremities and Patient moving all extremities X 4  Post vital signs: Reviewed and stable  Complications: No apparent anesthesia complications

## 2014-08-18 NOTE — Consult Note (Signed)
Consult Note  Patient ID: [name] MRN: [id number], DOB: [birth year] Date of Encounter: 08/18/2014, 8:05 AM Primary Physician: Placido Sou, MD Primary Cardiologist: Claiborne Billings  Reason for Consult: pre-op clearance  HPI:  [name] is a 53 y.o. male with a past medical history significant for CAD (s/p DES to RCA 12/2013), ESRD on HD, hypertension, Hep C, chronic diastolic heart failure, and PVD pending right fem-pop bypass this admission.  In April of this year, he developed chest pain in the setting of marked hypertension following peripheral angiography.  Catheterization demonstrated severe RCA disease treated with rotoblator followed by DES X3.  He was readmitted in October of this year for repeat peripheral intervention after which he developed chest pain again in the setting of hypertension.  The patient's chest pain resolved with improvement in blood pressure control and his EKG was non-acute.   He presented for vascular follow up 08-07-14 at which time he had a newly occluded tibioperoneal trunk occlusion.  He was given medication for pain control but presented to the ER after being unable to control pain at home.  Plan is for right fem-pop bypass.  Cardiology has been asked to evaluate for pre-op clearance.   He states that prior to April, he did not have chest pain, shortness of breath or palpitations.  He has had intermittent chest pain since April but none since admission in October of this year.  He has not had recent shortness of breath, palpitations, weight loss or weight gain, fevers, chills, sweats.  He is concerned about his right leg and the outcome of possible surgery.   Past Medical History  Diagnosis Date  . ESRD on hemodialysis     a. ESRD 2/2 HTN with renal transplant in 1987 (cadaveric) after short period of dialysis;  b. Transplant failed in 2004 and he went back on HD;  c. As of 10/15 getting HD via L thigh AVG on a TTS schedule at [facility] on [facility].  .  Hypertension   . Hx of kidney transplant     a. 1987-> back on HD since 2004  . Gout tophi   . Chronic steroid use     a. Has severe gout. Did not tolerate Allopurinol. Do not taper per PCP   . Systolic CHF, chronic     2 D echo 04/2012 with EF of 45 %   . Malnutrition   . Hx SBO 04/2012    a. 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 F/U with Dr Donne Hazel. Started on TNA  . Anemia associated with chronic renal failure   . Hepatitis C   . GERD (gastroesophageal reflux disease)   . H/O hiatal hernia   . Arthritis   . History of DVT (deep vein thrombosis)   . Peripheral vascular disease     a. 12/2013 PTA of L Pop;  b. 12/2013 PTA R pop, R DP;  c. 06/2014 L Pop CBA/DCB PTA.  . Prostate cancer   . CAD (coronary artery disease)     a. 12/2013 Cath/PCI: EF 45-50%, LM Ca2+, LAD 30-40p, 33m, D1/2/3 min irregs, LCX 50ost, 30-106m, RCA 70p, 95/31m (Rota->3.0x23 Xience distal, 3.0x23 Xience mid, 3.0x28 Xience prox).  . Chronic diastolic CHF (congestive heart failure)     a. 12/2013 Echo: EF 55-60%, mild LVH, nl wall motion, Gr 2 DD.  . Dry gangrene     a. L great toe  Most Recent Cardiac Studies: Catheterization 12/2013- a. 12/2013 Cath/PCI: EF 45-50%, LM Ca2+, LAD 30-40p, 42m, D1/2/3 min irregs, LCX 50ost, 30-8m, RCA 70p, 95/69m (Rota->3.0x23 Xience distal, 3.0x23 Xience mid, 3.0x28 Xience prox). Echocardiogram 12/2013 - EF 55-60%, no RWMA, grade 2 diastolic dysfunction   Surgical History:  Past Surgical History  Procedure Laterality Date  . Laparotomy  04/14/2012    Procedure: EXPLORATORY LAPAROTOMY;  Surgeon: Joyice Faster. Cornett, MD;  Location: [place];  Service: General;  Laterality: N/A;  . Colon resection  04/14/2012  . Laparotomy  04/22/2012    Procedure: EXPLORATORY LAPAROTOMY;  Surgeon: Adin Hector, MD;  Location: [place];  Service: General;  Laterality: N/A;  lysis of adhesions, omentoplasty, repair small bowel  .  Thrombectomy w/ embolectomy  04/27/2012    Procedure: THROMBECTOMY ARTERIOVENOUS GORE-TEX GRAFT;  Surgeon: Angelia Mould, MD;  Location: [place];  Service: Vascular;  Laterality: Left;  Thrombectomy of left thigh arteriovenous gortex graft  . Prostectomy  2011  . Renal grafts    . Pelvic abcess drainage Right 6/14    removal drain s/p bowl resection 13  . Transanal excision of rectal mass N/A 03/30/2013    Procedure: EXCISION OF anal MASS;  Surgeon: Joyice Faster. Cornett, MD;  Location: [place];  Service: General;  Laterality: N/A;  Exam under anesthesia with excision anal verge mass  . Aortogram  08/15/2014    abd aortogram     Home Meds: Prior to Admission medications   Medication Sig Start Date End Date Taking? Authorizing Provider  amLODipine (NORVASC) 5 MG tablet Take 5 mg by mouth daily.  07/26/14  Yes Historical Provider, MD  aspirin EC 81 MG tablet Take 81 mg by mouth daily.   Yes Historical Provider, MD  atorvastatin (LIPITOR) 40 MG tablet TAKE 1 TABLET BY MOUTH DAILY AT 6 PM 08/14/14  Yes Troy Sine, MD  calcium acetate (PHOSLO) 667 MG tablet Take 2,001 mg by mouth 3 (three) times daily.  01/24/13  Yes Historical Provider, MD  carvedilol (COREG) 6.25 MG tablet TAKE 1 TABLET BY MOUTH TWICE DAILY WITH MEALS 08/14/14  Yes Troy Sine, MD  cinacalcet (SENSIPAR) 60 MG tablet Take 60 mg by mouth daily.   Yes Historical Provider, MD  clopidogrel (PLAVIX) 75 MG tablet Take 1 tablet (75 mg total) by mouth daily with breakfast. 01/14/14  Yes Lendon Colonel, NP  COLCRYS 0.6 MG tablet Take 0.6 mg by mouth daily.  01/04/13  Yes Historical Provider, MD  HYDROmorphone (DILAUDID) 2 MG tablet Take 1 to 2 tablets by mouth every 6 hours as needed for postoperative pain. Patient taking differently: Take 1-2 mg by mouth every 6 (six) hours as needed (pain).  08/07/14  Yes Serafina Mitchell, MD  LIDOCAINE EX Apply 1 application topically See admin instructions. Apply to inject site on dialysis days  (Tues, Thur, Sat) if needed for pain   Yes Historical Provider, MD  loperamide (IMODIUM A-D) 2 MG tablet Take 2 mg by mouth daily as needed for diarrhea or loose stools.  08/13/12  Yes Erroll Luna, MD  nitroGLYCERIN (NITRODUR - DOSED IN MG/24 HR) 0.3 mg/hr patch Place 1 patch (0.3 mg total) onto the skin daily. 01/14/14  Yes Lendon Colonel, NP  oxyCODONE (ROXICODONE) 5 MG immediate release tablet Take 1 tablet (5 mg total) by mouth every 4 (four) hours as needed for severe pain. 08/02/14  Yes Serafina Mitchell, MD  pantoprazole (PROTONIX) 40 MG tablet Take 1 tablet (  40 mg total) by mouth daily. 01/14/14  Yes Lendon Colonel, NP  predniSONE (DELTASONE) 5 MG tablet Take 5 mg by mouth daily.   Yes Historical Provider, MD  multivitamin (RENA-VIT) TABS tablet Take 1 tablet by mouth daily. 07/05/12   Rosalia Hammers, MD    Allergies:  Allergies  Allergen Reactions  . Allopurinol Other (See Comments)    REACTION: decreased platelets    History   Social History  . Marital Status: Single    Spouse Name: N/A    Number of Children: N/A  . Years of Education: N/A   Occupational History  . Not on file.   Social History Main Topics  . Smoking status: Former Smoker -- 1.00 packs/day for 10 years    Types: Cigarettes    Quit date: 03/23/1973  . Smokeless tobacco: Never Used     Comment: Quit 30 years ago  . Alcohol Use: No  . Drug Use: No  . Sexual Activity: No   Other Topics Concern  . Not on file   Social History Narrative   Currently in the nursing home until end of the month then will go to live with sister.            Family History  Problem Relation Age of Onset  . Hypertension Father   . Pneumonia Brother   . Lung disease Brother     Review of Systems: General: positive for bilateral leg pain R>L; negative for chills, fever, night sweats or weight changes.  Cardiovascular: negative for chest pain, edema, orthopnea, palpitations, paroxysmal nocturnal dyspnea,  shortness of breath or dyspnea on exertion Dermatological: negative for rash Respiratory: negative for cough or wheezing Urologic: negative for hematuria Abdominal: negative for nausea, vomiting, diarrhea, bright red blood per rectum, melena, or hematemesis Neurologic: negative for visual changes, syncope, or dizziness All other systems reviewed and are otherwise negative except as noted above.  Labs:   Lab Results  Component Value Date   WBC 9.9 08/18/2014   HGB 9.7* 08/18/2014   HCT 31.2* 08/18/2014   MCV 86.9 08/18/2014   PLT 213 08/18/2014    Recent Labs Lab 08/16/14 1520 08/18/14 0348  NA 135* 136*  K 4.5 4.3  CL 88* 93*  CO2 22 25  BUN 59* 40*  CREATININE 9.43* 8.40*  CALCIUM 8.5 8.8  PROT 7.0  --   BILITOT 0.7  --   ALKPHOS 139*  --   ALT 23  --   AST 30  --   GLUCOSE 107* 121*   No results for input(s): CKTOTAL, CKMB, TROPONINI in the last 72 hours. Lab Results  Component Value Date   CHOL 121 01/04/2014   HDL 68 01/04/2014   LDLCALC 26 01/04/2014   TRIG 137 01/04/2014     EKG: NSR, rate 82, normal intervals, non-specific inferior ST-T changes, LAE  Telemetry: sinus rhythm with short runs of atrial tach  Radiology: No CXR since 06/2012  Physical Exam: Blood pressure 130/53, pulse 82, temperature 98.7 F (37.1 C), temperature source Oral, resp. rate 18, height 5\' 11"  (1.803 m), weight 101 lb 3.1 oz (45.9 kg), SpO2 99 %. General: Thin, frail, chronically ill appearing AAM in no acute distress. Head: Normocephalic, atraumatic, sclera non-icteric, no xanthomas, nares are without discharge.  Neck: Bilateral carotid bruits. JVD not elevated. Lungs: Clear bilaterally to auscultation without wheezes, rales, or rhonchi. Breathing is unlabored. Heart: RRR with S1 S2. 2/6 MR. No rubs, or gallops appreciated. Abdomen: Soft, non-tender, non-distended with  normoactive bowel sounds. No hepatomegaly. No rebound/guarding. No obvious abdominal masses. Msk:  Strength  and tone appear normal for age. Extremities: No clubbing or cyanosis. No edema.  Left LLE with some edema, dry gangrene of left great toe; left thigh fistula with bruit Failed graft in RUE Neuro: Alert and oriented X 3. No focal deficit. No facial asymmetry. Moves all extremities spontaneously. Psych:  Responds to questions appropriately with a flat affect.    ASSESSMENT AND PLAN:  1.  CAD S/p DES to RCA in April of 2015 He reports compliance with Plavix/ASA No chest pain since admission 06/2014 Continue BB, statin, ASA, NTG patch Ideally would continue Plavix uninterrupted for 1 year s/p DES   2.  HTN Stable Continue Amlodipine, Carvedilol, NTG patch  3.  PVD Right leg rest pain following recent percutaneous intervention with right popliteal occlusion Pending right fem-pop   4.  ESRD On HD T-Th-Sat Management per nephrology  5.  Chronic diastolic heart failure Appears euvolemic on exam Continue BB, amlodipine  Patient is at moderate risk from a cardiac standpoint for surgery.  Would ideally continue Plavix for 1 year uninterrupted post DES.  However, with rest leg pain related to PVD, benefits of surgery likely outweigh risks. With non-sustained atrial tach and chest pain in the setting of hypertension in the past, recommend continuing anti-hypertensive regimen in peri-operative phase.   Signed, Chanetta Marshall, NP-S 08/18/2014 8:44 AM  Patient seen and examined with Chanetta Marshall NP-S. We discussed all aspects of the encounter. I agree with the assessment and plan as stated above.   [name] has significant CAD. He is s/p DES x 3 to RCA in 4/15. In 10/15 had peripheral procedure c/b slight bump in troponin in setting of hypertension. Since that time has not had any CP but is very limited due to claudication and rest pain. Has been off Plavix for a few days.  Overall, I think he is at moderate to high risk for peri-op cardiac complications but I do not think this risk is  prohibitive. I also do not think that there is much we can do to mitigate his risk. Would proceed to surgery without further cardiac work-up. Please restart Plavix as soon as possible after surgery given recent DES x3. We will follow post-operatively.   Kelvin Sennett,MD 9:32 AM

## 2014-08-18 NOTE — Op Note (Signed)
Patient name: [name] MRN: [id number] DOB: [birth year] Sex: male  08/16/2014 - 08/18/2014 Pre-operative Diagnosis: Right leg rest pain Post-operative diagnosis:  Same Surgeon:  Eldridge Abrahams Assistants:  Leontine Locket Procedure:   Right femoral to below knee popliteal artery bypass graft with 6 mm external ring propatent PTFE Anesthesia:  Gen. Blood Loss:  See anesthesia record Specimens:  None  Findings:  Extensive full-thickness calcified plaque within the below knee popliteal artery.  I debated not even opening the artery and aborting the procedure.  Ultimately I performed a end to end distal anastomosis with pledgets.  He had Doppler dependent signals in the posterior tibial and anterior tibial artery at the end of the case  Indications:  The patient has a history of bilateral popliteal artery percutaneous intervention for ulcers.  He recently underwent a second intervention on the right for a recurrent stenosis.  Approximately one week after this procedure he developed significant pain in his leg.  Angiography revealed an occluded popliteal artery.  The patient was unable to tolerate his level of discomfort and therefore he comes in today for revascularization.  He was admitted for heparin and pain control.  The patient has a significant cardiac history having undergone drug-eluting stents within the recent past.  He is on Plavix.  He does have adequate vein however he is unable to come off his Plavix and therefore I have elected to proceed with Gore-Tex to decrease his bleeding risk.  Procedure:  The patient was identified in the holding area and taken to [place] 16  The patient was then placed supine on the table. general anesthesia was administered.  The patient was prepped and draped in the usual sterile fashion.  A time out was called and antibiotics were administered.  A longitudinal incision was made in the right groin.  Cautery was used to divide subcutaneous tissue  down to the femoral sheath which was opened sharply.  I exposed the common femoral artery from the inguinal ligament down to the bifurcation.  This artery was heavily calcified but there was a soft area anteriorly.  Next a medial below-knee incision was made fashion was opened with cautery and the popliteal space was entered.  I took down several attachments of the soleus to the tibia to get better exposure of the popliteal artery.  Popliteal artery was dissected free.  I did ligate the anterior tibial vein.  I was able to visualize anterior tibial artery and the tibioperoneal trunk.  This artery was extremely calcified.  There were no soft areas to make an arteriotomy.  At this point I contemplated aborting the procedure because of the calcification but ultimately decided on proceeding.  A subsartorial tunnel was created between the 2 incisions.  The patient was fully heparinized.  Proximally a Hanley clamp was placed on the common femoral artery.  I used a #3 Fogarty for distal control as a vascular clamp would not occlude the artery.  The arteriotomy was in the mid common femoral artery.  This was extended longitudinally with Potts scissors.  A 6 mm propatent Gore-Tex graft was selected with external rings.  A end to side anastomosis was created with running CV 6 Gore-Tex suture.  Prior to completion, the appropriate flushing maneuvers were performed and the anastomosis was completed.  There was excellent pulsatile flow through the graft.  The graft was then brought to the previously created tunnel.  A tourniquet was placed in the upper thigh and taken  to 300 mmHg after exsanguinating the leg with an Esmarch.  I used a #11 blade to attempt to open the popliteal artery.  I could not get this to go through the plaque.  Ultimately with some force I was able to get back bleeding and then inserted a pair of Potts scissors to open up the artery.  It was extensively calcified.  I tried to perform a limited  endarterectomy however the adventitia was very poor quality and the artery began to free up such that I had to transect it.  At this point I felt I could sew a end to end anastomosis.  The graft was cut to the appropriate length.  A running end to end anastomosis was created with 5-0 Prolene.  When I was trying to secure the anastomosis some of the sutures pulled through the artery.  This left a significant defect anteriorly.  I elected to take another 5-0 Prolene and redo the anterior wall with the incorporation of a felt strip.  After this was done, the appropriate flushing maneuvers were performed and the anastomosis was completed.  One repair stitch was required for hemostasis.  Hand-held Doppler revealed a excellent signal in the popliteal artery below the anastomosis.  The patient also had Doppler dependent signals in the posterior tibial and anterior tibial artery.  50 mg of protamine was administered.  There was mild oozing from the distal anastomosis and I used CoSeal to help with hemostasis.  Ultimately I was satisfied.  The femoral sheath was then reapproximated with 2-0 Vicryl followed by 3-0 Vicryl the subcutaneous tissue and a 4-0 Vicryl on the skin.  The below knee incision was closed by reapproximating the fascia with 2-0 Vicryl and a second layer of 2-0 Vicryl the subcutaneous tissue with 4-0 Vicryl and the skin.  Dermabond was applied.  There were no immediate complications.   Disposition:  To PACU in stable condition.   Theotis Burrow, M.D. [facility] Office: [phone] Pager:  [phone]

## 2014-08-18 NOTE — Anesthesia Postprocedure Evaluation (Signed)
Anesthesia Post Note  Patient: [name]  Procedure(s) Performed: Procedure(s) (LRB): BYPASS GRAFT FEMORAL-POPLITEAL ARTERY with Gortex Graft (Right)  Anesthesia type: general  Patient location: PACU  Post pain: Pain level controlled  Post assessment: Patient's Cardiovascular Status Stable  Last Vitals:  Filed Vitals:   08/18/14 1700  BP: 132/66  Pulse:   Temp:   Resp: 16    Post vital signs: Reviewed and stable  Level of consciousness: sedated  Complications: No apparent anesthesia complications

## 2014-08-18 NOTE — Progress Notes (Addendum)
  Day of Surgery Note    Subjective:  No complaints  Filed Vitals:   08/18/14 0512  BP: 130/53  Pulse: 82  Temp: 98.7 F (37.1 C)  Resp: 18    Incisions:   Right groin and below knee incision are c/d/i Extremities:  Right foot with + doppler signal in the DP/PT; right calf is soft Lungs:  Non labored   Assessment/Plan:  This is a 53 y.o. male who is s/p Right femoral to below knee popliteal artery bypass graft with 6 mm external ring propatent PTFE  -pt doing well in PACU with + doppler signal in the right DP/PT -will transfer to [facility] this afternoon   [employer], PA-C 08/18/2014 3:06 PM   I agree with the above.  Patient looks good in pacu.  No need for additional heparin.  Plavix restarted  [name]

## 2014-08-18 NOTE — Anesthesia Procedure Notes (Signed)
Procedure Name: Intubation Date/Time: 08/18/2014 11:11 AM Performed by: Gaylene Brooks Pre-anesthesia Checklist: Patient identified, Emergency Drugs available, Suction available, Patient being monitored and Timeout performed Patient Re-evaluated:Patient Re-evaluated prior to inductionOxygen Delivery Method: Circle system utilized Preoxygenation: Pre-oxygenation with 100% oxygen Intubation Type: IV induction Ventilation: Mask ventilation without difficulty Laryngoscope Size: Miller and 2 Grade View: Grade I Tube type: Oral Number of attempts: 1 Airway Equipment and Method: Stylet Placement Confirmation: ETT inserted through vocal cords under direct vision,  positive ETCO2,  CO2 detector and breath sounds checked- equal and bilateral Secured at: 23 cm Tube secured with: Tape Dental Injury: Teeth and Oropharynx as per pre-operative assessment

## 2014-08-18 NOTE — Progress Notes (Signed)
Pt arrived to unit accompanied by PACU RN. Pt oriented to room/unit, VS stable. No s/s of acute distress noted.

## 2014-08-18 NOTE — Progress Notes (Signed)
[facility] Rounding Note Subjective:  Foot pain controlled as long as gets pain meds Right foot dorsum , toes and bottom of foot distally remain the most painful Headed to the OR shortly for right fem-pop bypass. Cardiology (Dr. Jeffie Pollock) has cleared for surgery with no pre-op testing required   Objective Vital signs in last 24 hours: Filed Vitals:   08/17/14 1441 08/17/14 1752 08/17/14 2118 08/18/14 0512  BP: 105/36 103/30 141/50 130/53  Pulse: 82 79 85 82  Temp: 98.7 F (37.1 C)  99.5 F (37.5 C) 98.7 F (37.1 C)  TempSrc: Oral  Oral Oral  Resp: 18  18 18   Height:      Weight:      SpO2: 93%  96% 99%   Weight change:   47.5->46.4 with HD 11/25 (EDW 46.5 kg)  Intake/Output Summary (Last 24 hours) at 08/18/14 1029 Last data filed at 08/18/14 0806  Gross per 24 hour  Intake 529.94 ml  Output      0 ml  Net 529.94 ml    Physical Exam: BP 130/53 mmHg  Pulse 82  Temp(Src) 98.7 F (37.1 C) (Oral)  Resp 18  Ht 5\' 11"  (1.803 m)  Wt 45.9 kg (101 lb 3.1 oz)  BMI 14.12 kg/m2  SpO2 99% General appearance: alert, cooperative and no distress Neck: No JVD  Resp: clear to auscultation bilaterally Cardio: regular rate and rhythm, S1, S2 normal, no murmur, click, rub or gallop GI: soft, non-tender; bowel sounds normal; midline surgical scar, no organomegaly Extremities: R foot with darkened area medially and dorsally and decreased temperature from mid-foot distally; left lower leg/foot mostly is swollen and warm, but with 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 Mild odor noted Neurologic: Grossly normal Dialysis Access: Left thigh AVG with + bruit   Labs: Basic Metabolic Panel:  Recent Labs Lab 08/13/14 1051 08/15/14 0759 08/16/14 1520 08/18/14 0348  NA 144 136* 135* 136*  K 3.9 4.5 4.5 4.3  CL 96 94* 88* 93*  CO2 30  --  22 25  GLUCOSE 84 75 107* 121*  BUN 32* 37* 59* 40*  CREATININE 6.46* 6.30* 9.43* 8.40*  CALCIUM 8.8  --  8.5 8.8   Liver  Function Tests:  Recent Labs Lab 08/16/14 1520  AST 30  ALT 23  ALKPHOS 139*  BILITOT 0.7  PROT 7.0  ALBUMIN 2.6*    Recent Labs Lab 08/13/14 1051 08/15/14 0759 08/16/14 1520 08/17/14 0344 08/18/14 0348  WBC 7.2  --  10.0 10.9* 9.9  HGB 11.5* 12.2* 10.1* 10.8* 9.7*  HCT 37.3* 36.0* 31.2* 34.2* 31.2*  MCV 91.2  --  86.4 88.1 86.9  PLT 202  --  172 234 213   Scheduled Medications: . sodium chloride 10 mL/hr (08/17/14 0043)  . heparin 750 Units/hr (08/17/14 2011)   . [MAR Hold] amLODipine  5 mg Oral Daily  . [MAR Hold] aspirin EC  81 mg Oral Daily  . [MAR Hold] atorvastatin  40 mg Oral q1800  . calcitRIOL  2.75 mcg Oral Once  . [MAR Hold] calcitRIOL  2.75 mcg Oral Q T,Th,Sa-HD  . [MAR Hold] calcium acetate  2,001 mg Oral TID WC  . [MAR Hold] carvedilol  6.25 mg Oral BID WC  . [MAR Hold] cinacalcet  60 mg Oral Daily  . [MAR Hold] colchicine  0.3 mg Oral Once per day on Mon Thu  . [MAR Hold] darbepoetin (ARANESP) injection - DIALYSIS  25 mcg Intravenous Q Thu-HD  . [  MAR Hold] docusate sodium  100 mg Oral BID  . [MAR Hold] feeding supplement (NEPRO CARB STEADY)  237 mL Oral BID BM  . [MAR Hold] ferric gluconate (FERRLECIT/NULECIT) IV  125 mg Intravenous Q T,Th,Sa-HD  . [MAR Hold] multivitamin  1 tablet Oral QHS  . [MAR Hold] nitroGLYCERIN  0.3 mg Transdermal Daily  . [MAR Hold] pantoprazole  40 mg Oral Daily  . [MAR Hold] predniSONE  5 mg Oral Daily   Dialysis Orders: TTS @ [facility] 3:30 46.5 kg 2K/2Ca 400/800 Profile 2 Heparin 4000 U L thigh AVG  Calcitriol 2.75 mcg Aranesp 25 mcg on Thurs Venofer 100 mg through 12/3, then qwk  Background 53 y.o. AA male with a history of peripheral vascular disease, CAD s/p PCI in 12/2013, CHF, Hepatitis C, small bowel obstruction s/p exploratory laparotomy in 03/2012, and ESRD on dialysis at the [facility] who had drug-coated balloon angioplasty of the right popliteal artery per  Dr. Trula Slade on 11/11, but returned to the ED 11/22 with worsening right foot pain and was found to have significant plaque with reduced flows throughout the right lower extremity, similar to previous studies at [facility], but was scheduled for right lower extremity angiogram 11/24 per Dr. Bridgett Larsson. Right leg runoff showed heavily calcified right arterial system with multiple degrees of stenosis, including an occluded right popliteal artery. Patient was unable to stand and thus unable to attend dialysis 11/25 secondary to severe right foot pain and was admitted to the hospital for pain management/IV heparin/possible intervention   Assessment/Plan: 1. PVD - s/p balloon angioplasty L popliteal artery 10/28 & R popliteal artery 11/11, s/p R leg runoff 11/24 per Dr. Bridgett Larsson, showed occluded popliteal artery, intervention pending. Severe rest pain in the right foot, controlled with pain meds at present. OR today for fem-pop. 2. ESRD - HD on TTS @ [facility], K 4.5. HD 11/25 on Holiday schedule - next treatment will be on Saturday 3. Hypertension/volume - Amlodipine 10 mg qhs, Carvedilol 6.25 mg bid; at EDW post HD 11/25 4. Anemia - Hgb 10.8, Aranesp 25 mcg on Thurs (today per holiday schedule), Venofer loading through 12/3. Hb has dropped and will likely drop further with surgery. Increase Aranesp but will wait on dosing until we see post-op Hb. 5. Metabolic bone disease - Last  iPTH 713; Calcitriol 2.75 mcg, Sensipar 60 mg qd, Phoslo 4 with meals. 6. Nutrition - Last Alb 2.6, renal diet, vitamin. 7. Hx CAD - s/p cath w/ PCI 12/2013 (RCA rotablator PCI and DES X3). No chest pain. Cards has cleared for fem-pop. 8. Gout - on colchicine. Not currently bothering him  Jamal Maes, MD [facility] [phone] pager 08/18/2014, 10:29 AM

## 2014-08-18 NOTE — Progress Notes (Signed)
ANTICOAGULATION CONSULT NOTE - Follow Up Consult  Pharmacy Consult for Heparin Indication: occluded R popliteal artery  Allergies  Allergen Reactions  . Allopurinol Other (See Comments)    REACTION: decreased platelets    Patient Measurements: Height: 5\' 11"  (180.3 cm) Weight: 101 lb 3.1 oz (45.9 kg) IBW/kg (Calculated) : 75.3 Heparin Dosing Weight: 46kg  Vital Signs: Temp: 98.7 F (37.1 C) (11/27 0512) Temp Source: Oral (11/27 0512) BP: 130/53 mmHg (11/27 0512) Pulse Rate: 82 (11/27 0512)  Labs:  Recent Labs  08/16/14 1520 08/16/14 2328 08/17/14 0343 08/17/14 0344 08/18/14 0348  HGB 10.1*  --   --  10.8* 9.7*  HCT 31.2*  --   --  34.2* 31.2*  PLT 172  --   --  234 213  LABPROT 15.1  --   --   --   --   INR 1.18  --   --   --   --   HEPARINUNFRC  --  0.46 0.30  --  0.38  CREATININE 9.43*  --   --   --  8.40*    Estimated Creatinine Clearance: 6.5 mL/min (by C-G formula based on Cr of 8.4).   Medications:  Heparin 750 units/hr  Assessment: 53yom continues on heparin for occluded R popliteal artery. Heparin level (0.38) remains therapeutic. Noted [facility] plans for possible intervention today (vs Tuesday) - will follow-up procedure plans and post-op orders. - H/H and Plts trending down - monitor closely - No significant bleeding reported  Goal of Therapy:  Heparin level 0.3-0.7 units/ml Monitor platelets by anticoagulation protocol: Yes   Plan:  1. Continue heparin drip to 750 units/hr (7.5 ml/hr) 2. Monitor daily heparin level and CBC 3. Follow-up [facility] plans, post op orders  Earleen Newport  [phone] 08/18/2014,8:14 AM

## 2014-08-18 NOTE — Progress Notes (Signed)
ANTIBIOTIC CONSULT NOTE - INITIAL  Pharmacy Consult:  Zinacef Indication:  Surgical prophylaxis   Allergies  Allergen Reactions  . Allopurinol Other (See Comments)    REACTION: decreased platelets    Patient Measurements: Height: 5\' 11"  (180.3 cm) Weight: 101 lb 3.1 oz (45.9 kg) IBW/kg (Calculated) : 75.3  Vital Signs: Temp: 98 F (36.7 C) (11/27 1630) Temp Source: Oral (11/27 1630) BP: 136/39 mmHg (11/27 1545) Pulse Rate: 77 (11/27 1530) Intake/Output from previous day: 11/26 0701 - 11/27 0700 In: 762.4 [P.O.:600; I.V.:162.4] Out: -  Intake/Output from this shift: Total I/O In: 1392.5 [I.V.:807.5; Blood:335; IV Piggyback:250] Out: 500 [Blood:500]  Labs:  Recent Labs  08/16/14 1520 08/17/14 0344 08/18/14 0348 08/18/14 1302  WBC 10.0 10.9* 9.9  --   HGB 10.1* 10.8* 9.7* 9.2*  PLT 172 234 213  --   CREATININE 9.43*  --  8.40*  --    Estimated Creatinine Clearance: 6.5 mL/min (by C-G formula based on Cr of 8.4). No results for input(s): VANCOTROUGH, VANCOPEAK, VANCORANDOM, GENTTROUGH, GENTPEAK, GENTRANDOM, TOBRATROUGH, TOBRAPEAK, TOBRARND, AMIKACINPEAK, AMIKACINTROU, AMIKACIN in the last 72 hours.   Microbiology: Recent Results (from the past 720 hour(s))  MRSA PCR Screening     Status: None   Collection Time: 07/19/14  6:28 PM  Result Value Ref Range Status   MRSA by PCR NEGATIVE NEGATIVE Final    Comment:        The GeneXpert MRSA Assay (FDA approved for NASAL specimens only), is one component of a comprehensive MRSA colonization surveillance program. It is not intended to diagnose MRSA infection nor to guide or monitor treatment for MRSA infections.    Medical History: Past Medical History  Diagnosis Date  . ESRD on hemodialysis     a. ESRD 2/2 HTN with renal transplant in 1987 (cadaveric) after short period of dialysis;  b. Transplant failed in 2004 and he went back on HD;  c. As of 10/15 getting HD via L thigh AVG on a TTS schedule at [facility]  on [facility].  . Hypertension   . Hx of kidney transplant     a. 1987-> back on HD since 2004  . Gout tophi   . Chronic steroid use     a. Has severe gout. Did not tolerate Allopurinol. Do not taper per PCP   . Systolic CHF, chronic     2 D echo 04/2012 with EF of 45 %   . Malnutrition   . Hx SBO 04/2012    a. 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 F/U with Dr Donne Hazel. Started on TNA  . Anemia associated with chronic renal failure   . Hepatitis C   . GERD (gastroesophageal reflux disease)   . H/O hiatal hernia   . Arthritis   . History of DVT (deep vein thrombosis)   . Peripheral vascular disease     a. 12/2013 PTA of L Pop;  b. 12/2013 PTA R pop, R DP;  c. 06/2014 L Pop CBA/DCB PTA.  . Prostate cancer   . CAD (coronary artery disease)     a. 12/2013 Cath/PCI: EF 45-50%, LM Ca2+, LAD 30-40p, 63m, D1/2/3 min irregs, LCX 50ost, 30-89m, RCA 70p, 95/20m (Rota->3.0x23 Xience distal, 3.0x23 Xience mid, 3.0x28 Xience prox).  . Chronic diastolic CHF (congestive heart failure)     a. 12/2013 Echo: EF 55-60%, mild LVH, nl wall motion, Gr 2 DD.  . Dry gangrene  a. L great toe     Assessment: [name] s/p right femoral to below knee popliteal artery bypass graft to start Zinacef post-op for surgical prophylaxis.  Noted patient received Ancef pre-op.  Patient has ESRD currently on holiday dialysis schedule.   Goal of Therapy:  Infection prevention   Plan:  - Zinacef 1.5gm IV x 1 dose only - Pharmacy will sign off.  Thank you for the consult!    Lissete Maestas D. Mina Marble, PharmD, BCPS Pager:  [phone] 08/18/2014, 4:47 PM

## 2014-08-19 DIAGNOSIS — I998 Other disorder of circulatory system: Secondary | ICD-10-CM

## 2014-08-19 DIAGNOSIS — I10 Essential (primary) hypertension: Secondary | ICD-10-CM

## 2014-08-19 DIAGNOSIS — I2510 Atherosclerotic heart disease of native coronary artery without angina pectoris: Secondary | ICD-10-CM

## 2014-08-19 LAB — RENAL FUNCTION PANEL
ALBUMIN: 2.3 g/dL — AB (ref 3.5–5.2)
ANION GAP: 23 — AB (ref 5–15)
BUN: 49 mg/dL — ABNORMAL HIGH (ref 6–23)
CALCIUM: 8.8 mg/dL (ref 8.4–10.5)
CO2: 19 mEq/L (ref 19–32)
Chloride: 93 mEq/L — ABNORMAL LOW (ref 96–112)
Creatinine, Ser: 9.87 mg/dL — ABNORMAL HIGH (ref 0.50–1.35)
GFR calc non Af Amer: 5 mL/min — ABNORMAL LOW (ref >90)
GFR, EST AFRICAN AMERICAN: 6 mL/min — AB (ref >90)
GLUCOSE: 59 mg/dL — AB (ref 70–99)
POTASSIUM: 5 meq/L (ref 3.7–5.3)
Phosphorus: 5.7 mg/dL — ABNORMAL HIGH (ref 2.3–4.6)
SODIUM: 135 meq/L — AB (ref 137–147)

## 2014-08-19 LAB — CBC
HEMATOCRIT: 28.8 % — AB (ref 39.0–52.0)
HEMOGLOBIN: 9 g/dL — AB (ref 13.0–17.0)
MCH: 26.9 pg (ref 26.0–34.0)
MCHC: 31.3 g/dL (ref 30.0–36.0)
MCV: 86 fL (ref 78.0–100.0)
Platelets: 194 10*3/uL (ref 150–400)
RBC: 3.35 MIL/uL — ABNORMAL LOW (ref 4.22–5.81)
RDW: 15.8 % — AB (ref 11.5–15.5)
WBC: 13.6 10*3/uL — ABNORMAL HIGH (ref 4.0–10.5)

## 2014-08-19 LAB — GLUCOSE, CAPILLARY: GLUCOSE-CAPILLARY: 78 mg/dL (ref 70–99)

## 2014-08-19 MED ORDER — OXYCODONE HCL 5 MG PO TABS
ORAL_TABLET | ORAL | Status: AC
  Filled 2014-08-19: qty 1

## 2014-08-19 MED ORDER — SODIUM CHLORIDE 0.9 % IV SOLN
Freq: Once | INTRAVENOUS | Status: AC
  Administered 2014-08-19: 10:00:00 via INTRAVENOUS

## 2014-08-19 MED ORDER — DARBEPOETIN ALFA 60 MCG/0.3ML IJ SOSY
60.0000 ug | PREFILLED_SYRINGE | INTRAMUSCULAR | Status: DC
  Administered 2014-08-19 – 2014-09-02 (×3): 60 ug via INTRAVENOUS
  Filled 2014-08-19 (×3): qty 0.3

## 2014-08-19 MED ORDER — SODIUM CHLORIDE 0.9 % IV BOLUS (SEPSIS)
1000.0000 mL | Freq: Once | INTRAVENOUS | Status: AC
  Administered 2014-08-19: 1000 mL via INTRAVENOUS

## 2014-08-19 MED ORDER — DARBEPOETIN ALFA 60 MCG/0.3ML IJ SOSY
PREFILLED_SYRINGE | INTRAMUSCULAR | Status: AC
  Filled 2014-08-19: qty 0.3

## 2014-08-19 MED ORDER — ACETAMINOPHEN 325 MG PO TABS
ORAL_TABLET | ORAL | Status: AC
  Filled 2014-08-19: qty 2

## 2014-08-19 MED ORDER — MORPHINE SULFATE 2 MG/ML IJ SOLN
INTRAMUSCULAR | Status: AC
  Filled 2014-08-19: qty 1

## 2014-08-19 MED ORDER — SODIUM CHLORIDE 0.9 % IV BOLUS (SEPSIS)
500.0000 mL | Freq: Once | INTRAVENOUS | Status: AC
  Administered 2014-08-19: 500 mL via INTRAVENOUS

## 2014-08-19 NOTE — Progress Notes (Signed)
PT Cancellation Note  Patient Details Name: [name] MRN: [id number] DOB: 08/23/1961   Cancelled Treatment:    Reason Eval/Treat Not Completed: Other (comment) (RN deferred therapy stating pt hypotensive, receiving blood and awaiting HD not appropriate at this time. Will attempt next date)   Melford Aase 08/19/2014, 10:51 AM Elwyn Reach, [place]

## 2014-08-19 NOTE — Progress Notes (Signed)
[facility] Rounding Note Subjective:  POD #1 fem pop bypass right Extensive calcification at time of surgery Got a unit of blood in the OR BP dropped this AM on bedside commode Got fluid bolus and is now getting blood Due for HD later today Dr. Bridgett Larsson is holding lovenox due to risk of blowout from diseased distal anastamosis so will not give any heparin with HD today   Objective Vital signs in last 24 hours: Filed Vitals:   08/19/14 0830 08/19/14 0921 08/19/14 0935 08/19/14 1001  BP: 102/35 117/42 87/40 105/35  Pulse: 97 88 85 85  Temp:  98 F (36.7 C) 98.2 F (36.8 C)   TempSrc:  Oral Oral   Resp: 25 26 23 19   Height:      Weight:      SpO2: 100% 100% 100% 100%   Weight change:   47.5->46.4 with HD 11/25 (EDW 46.5 kg)  Intake/Output Summary (Last 24 hours) at 08/19/14 1008 Last data filed at 08/19/14 0921  Gross per 24 hour  Intake   1745 ml  Output    500 ml  Net   1245 ml    Physical Exam: BP 105/35 mmHg  Pulse 85  Temp(Src) 98.2 F (36.8 C) (Oral)  Resp 19  Ht 5\' 11"  (1.803 m)  Wt 49.9 kg (110 lb 0.2 oz)  BMI 15.35 kg/m2  SpO2 100% General appearance: alert, cooperative and no distress. Some pain at incision sites but no more pain in foot Neck: No JVD  Resp: clear to auscultation bilaterally Cardio: regular rate and rhythm, S1, S2 normal, no murmur, click, rub or gallop GI: soft, non-tender; bowel sounds normal; midline surgical scar, no organomegaly Extremities: Neurologic: Grossly normal Dialysis Access: Left thigh AVG with + bruit  Incisions in right groin and right below the knee dry (+ doppler signal by report right DP/PT)  Labs: Basic Metabolic Panel:  Recent Labs Lab 08/13/14 1051 08/15/14 0759 08/16/14 1520 08/18/14 0348 08/18/14 1302 08/19/14 0212  NA 144 136* 135* 136* 132* 135*  K 3.9 4.5 4.5 4.3 3.6* 5.0  CL 96 94* 88* 93*  --  93*  CO2 30  --  22 25  --  19  GLUCOSE 84 75 107* 121* 96 59*  BUN 32* 37* 59* 40*  --   49*  CREATININE 6.46* 6.30* 9.43* 8.40*  --  9.87*  CALCIUM 8.8  --  8.5 8.8  --  8.8  PHOS  --   --   --   --   --  5.7*   Liver Function Tests:  Recent Labs Lab 08/16/14 1520 08/19/14 0212  AST 30  --   ALT 23  --   ALKPHOS 139*  --   BILITOT 0.7  --   PROT 7.0  --   ALBUMIN 2.6* 2.3*    Recent Labs Lab 08/16/14 1520 08/17/14 0344 08/18/14 0348 08/18/14 1302 08/19/14 0212  WBC 10.0 10.9* 9.9  --  13.6*  HGB 10.1* 10.8* 9.7* 9.2* 9.0*  HCT 31.2* 34.2* 31.2* 27.0* 28.8*  MCV 86.4 88.1 86.9  --  86.0  PLT 172 234 213  --  194   Scheduled Medications: . sodium chloride Stopped (08/18/14 1430)   . sodium chloride   Intravenous Once  . amLODipine  5 mg Oral Daily  . aspirin EC  81 mg Oral Daily  . atorvastatin  40 mg Oral q1800  . calcitRIOL  2.75 mcg Oral Once  . calcitRIOL  2.75  mcg Oral Q T,Th,Sa-HD  . calcium acetate  2,001 mg Oral TID WC  . carvedilol  6.25 mg Oral BID WC  . cinacalcet  60 mg Oral Daily  . clopidogrel  75 mg Oral Q breakfast  . [START ON 08/21/2014] colchicine  0.3 mg Oral Once per day on Mon Thu  . [START ON 08/24/2014] darbepoetin (ARANESP) injection - DIALYSIS  25 mcg Intravenous Q Thu-HD  . docusate sodium  100 mg Oral BID  . feeding supplement (NEPRO CARB STEADY)  237 mL Oral BID BM  . ferric gluconate (FERRLECIT/NULECIT) IV  125 mg Intravenous Q T,Th,Sa-HD  . multivitamin  1 tablet Oral QHS  . nitroGLYCERIN  0.3 mg Transdermal Daily  . pantoprazole  40 mg Oral Daily  . predniSONE  5 mg Oral Daily   Dialysis Orders: TTS @ [facility] 3:30 46.5 kg 2K/2Ca 400/800 Profile 2 Heparin 4000 U L thigh AVG  Calcitriol 2.75 mcg Aranesp 25 mcg on Thurs Venofer 100 mg through 12/3, then qwk  Background 53 y.o. AA male with a history of peripheral vascular disease, CAD s/p PCI in 12/2013, CHF, Hepatitis C, small bowel obstruction s/p exploratory laparotomy in 03/2012, and ESRD on dialysis at the [facility] who had drug-coated balloon angioplasty of the right popliteal artery per Dr. Trula Slade on 11/11, but returned to the ED 11/22 with worsening right foot pain and was found to have significant plaque with reduced flows throughout the right lower extremity, similar to previous studies at [facility], but was scheduled for right lower extremity angiogram 11/24 per Dr. Bridgett Larsson. Right leg runoff showed heavily calcified right arterial system with multiple degrees of stenosis, including an occluded right popliteal artery. Patient was unable to stand and thus unable to attend dialysis 11/25 secondary to severe right foot pain and was admitted to the hospital for pain management/IV heparin/intervention. Underwent right fem-pop on 11/27 with findings of extensive calcification   Assessment/Plan: 1. PVD - s/p balloon angioplasty L popliteal artery 10/28 & R popliteal artery 11/11, s/p R leg runoff 11/24 per Dr. Bridgett Larsson, showed occluded popliteal artery, intervention pending. Severe rest pain in the right foot.  S/P  OR 11/27 for right fem-pop. Extensive calcification and diseased anastamotic site. Per [facility]. They are holding anticoagulants until tomorrow so will not give any heparin with HD today. 2. Hypotension - occurred today on bedside commode. [facility] gave volume and blood with response. Will not remove any volume with HD today. Hb had not dropped a lot (got 1 unit in the OR), but with CAD they felt additional PRBC's appropriate. Hold amlodipine. 3. ESRD - HD on TTS @ [facility], K 4.5. HD 11/25 on Holiday schedule - HD today 4. Hypertension/volume - Amlodipine 10 mg qhs, Carvedilol 6.25 mg bid; at EDW post HD 11/25. With low BP this AM will hold amlodipine. Continue BB for cardiac protection. 5. Anemia - Hgb 10.8 on adm. , Aranesp 25 mcg on Thurs (today per holiday schedule), Venofer loading through 12/3. Hb dropped to nadir of 9 (got 1 unit in the OR, getting another unit now). Increase Aranesp to 60. 6. Metabolic bone  disease - Last  iPTH 713; Calcitriol 2.75 mcg, Sensipar 60 mg qd, Phoslo 4 with meals. 7. Nutrition - Last Alb 2.6, renal diet, vitamin. 8. Hx CAD - s/p cath w/ PCI 12/2013 (RCA rotablator PCI and DES X3). No chest pain. 9. Gout - on colchicine. Not currently bothering him  Jamal Maes, MD [facility] [phone] pager 08/19/2014,  10:08 AM

## 2014-08-19 NOTE — Progress Notes (Signed)
Primary cardiologist: Dr. Shelva Majestic  Seen for followup: Perioperative assessment with CAD   Subjective:    No chest pain, feels weak today. Not breathless at rest.  Objective:   Temp:  [97.7 F (36.5 C)-99.1 F (37.3 C)] 98.2 F (36.8 C) (11/28 0935) Pulse Rate:  [77-106] 85 (11/28 1130) Resp:  [13-28] 26 (11/28 1130) BP: (72-146)/(18-81) 91/73 mmHg (11/28 1130) SpO2:  [93 %-100 %] 100 % (11/28 1130) Weight:  [110 lb 0.2 oz (49.9 kg)-113 lb 15.7 oz (51.7 kg)] 110 lb 0.2 oz (49.9 kg) (11/28 0340) Last BM Date: 08/19/14  Filed Weights   08/17/14 0530 08/18/14 1630 08/19/14 0340  Weight: 101 lb 3.1 oz (45.9 kg) 113 lb 15.7 oz (51.7 kg) 110 lb 0.2 oz (49.9 kg)    Intake/Output Summary (Last 24 hours) at 08/19/14 1141 Last data filed at 08/19/14 3151  Gross per 24 hour  Intake   1745 ml  Output    500 ml  Net   1245 ml     Exam:  General: Thin, chronically ill appearing male in no distress.  Lungs: Clear, nonlabored breathing.  Cardiac: RRR, no gallop.  Extremities: AVG left thigh, right leg incisions C/D.   Lab Results:  Basic Metabolic Panel:  Recent Labs Lab 08/16/14 1520 08/18/14 0348 08/18/14 1302 08/19/14 0212  NA 135* 136* 132* 135*  K 4.5 4.3 3.6* 5.0  CL 88* 93*  --  93*  CO2 22 25  --  19  GLUCOSE 107* 121* 96 59*  BUN 59* 40*  --  49*  CREATININE 9.43* 8.40*  --  9.87*  CALCIUM 8.5 8.8  --  8.8    Liver Function Tests:  Recent Labs Lab 08/16/14 1520 08/19/14 0212  AST 30  --   ALT 23  --   ALKPHOS 139*  --   BILITOT 0.7  --   PROT 7.0  --   ALBUMIN 2.6* 2.3*    CBC:  Recent Labs Lab 08/17/14 0344 08/18/14 0348 08/18/14 1302 08/19/14 0212  WBC 10.9* 9.9  --  13.6*  HGB 10.8* 9.7* 9.2* 9.0*  HCT 34.2* 31.2* 27.0* 28.8*  MCV 88.1 86.9  --  86.0  PLT 234 213  --  194    ECG: Sinus rhythm with LVH and LAE (11/27).   Medications:   Scheduled Medications: . sodium chloride   Intravenous Once  . aspirin EC  81  mg Oral Daily  . atorvastatin  40 mg Oral q1800  . calcitRIOL  2.75 mcg Oral Once  . calcitRIOL  2.75 mcg Oral Q T,Th,Sa-HD  . calcium acetate  2,001 mg Oral TID WC  . carvedilol  6.25 mg Oral BID WC  . cinacalcet  60 mg Oral Daily  . clopidogrel  75 mg Oral Q breakfast  . [START ON 08/21/2014] colchicine  0.3 mg Oral Once per day on Mon Thu  . darbepoetin (ARANESP) injection - DIALYSIS  60 mcg Intravenous Q Sat-HD  . docusate sodium  100 mg Oral BID  . feeding supplement (NEPRO CARB STEADY)  237 mL Oral BID BM  . ferric gluconate (FERRLECIT/NULECIT) IV  125 mg Intravenous Q T,Th,Sa-HD  . multivitamin  1 tablet Oral QHS  . nitroGLYCERIN  0.3 mg Transdermal Daily  . pantoprazole  40 mg Oral Daily  . predniSONE  5 mg Oral Daily     Infusions: . sodium chloride Stopped (08/18/14 1430)     PRN Medications:  acetaminophen **OR** acetaminophen, guaiFENesin-dextromethorphan,  hydrALAZINE, labetalol, metoprolol, morphine injection, ondansetron, oxyCODONE, phenol   Assessment:   1. Status post right fem-pop bypass 11/27.  2. Hypotension this AM, no chest pain. Given IVF and also PRBCs and improving. Norvasc held. Hgb down to 9.0.  3. ESRD on HD per Nephrology.  4. CAD s/p DES to RCA April 2015. Plavix resumed.  5. Essential hypertension, medications held at present.   Plan/Discussion:    Followup ECG and continue observation. On ASA and Plavix, also Lipitor. Norvasc and Coreg held this AM.   Satira Sark, M.D., F.A.C.C.

## 2014-08-19 NOTE — Progress Notes (Signed)
Pt off the unit to HD accompanied by Angelito RN. No s/s of acute distress noted.

## 2014-08-19 NOTE — Progress Notes (Addendum)
Progress Note    08/19/2014 7:37 AM 1 Day Post-Op  Subjective:  denies CP & SOB.  States he had some numbness in his right hand that is now in his left hand.  Tm 99.1 45'X-646'O systolic--Had BP drop this am when up to bedside commode.   HR 70's-100's regular 100% RA  Filed Vitals:   08/19/14 0702  BP: 72/18  Pulse: 97  Temp:   Resp: 20    Physical Exam: Cardiac:  regular Lungs:  CTAB Incisions:  Right groin and right below knee incisions are c/d/i.  There is no hematoma in the groin Extremities:  + doppler signal right DP/PT; right calf is soft Abdomen:  Soft, NT/ND  CBC    Component Value Date/Time   WBC 13.6* 08/19/2014 0212   RBC 3.35* 08/19/2014 0212   HGB 9.0* 08/19/2014 0212   HCT 28.8* 08/19/2014 0212   PLT 194 08/19/2014 0212   MCV 86.0 08/19/2014 0212   MCH 26.9 08/19/2014 0212   MCHC 31.3 08/19/2014 0212   RDW 15.8* 08/19/2014 0212   LYMPHSABS 1.1 03/23/2013 1136   MONOABS 0.8 03/23/2013 1136   EOSABS 0.1 03/23/2013 1136   BASOSABS 0.0 03/23/2013 1136    BMET    Component Value Date/Time   NA 135* 08/19/2014 0212   K 5.0 08/19/2014 0212   CL 93* 08/19/2014 0212   CO2 19 08/19/2014 0212   GLUCOSE 59* 08/19/2014 0212   BUN 49* 08/19/2014 0212   CREATININE 9.87* 08/19/2014 0212   CREATININE 7.80* 01/16/2014 1437   CALCIUM 8.8 08/19/2014 0212   GFRNONAA 5* 08/19/2014 0212   GFRAA 6* 08/19/2014 0212    INR    Component Value Date/Time   INR 1.18 08/16/2014 1520     Intake/Output Summary (Last 24 hours) at 08/19/14 0737 Last data filed at 08/19/14 0600  Gross per 24 hour  Intake 1502.5 ml  Output    500 ml  Net 1002.5 ml     Assessment:  53 y.o. male is s/p:  Right femoral to below knee popliteal artery bypass graft with 6 mm external ring propatent PTFE  1 Day Post-Op  Plan: -pt doing well from surgery and has + doppler signals in the right DP/PT.  His calf is soft this am. -he did have a drop in his BP this am-he is  receiving a 500cc bolus.  His hgb is stable from yesterday afternoon, however, we will give him one unit of blood given his his cardiac hx.  (He did receive 1 unit PRBC's in OR yesterday).  Given his renal failure, will need to be careful of fluid status.  Today is supposed to be a dialysis day for him -his Plavix was restarted yesterday afternoon & he is on aspirin. -DVT prophylaxis:  None at this time due to high risk of bleeding--pt is on Plavix for drug eluding stents that was restarted yesterday. -keep in stepdown today   Leontine Locket, PA-C [facility] [phone] 08/19/2014 7:37 AM  Addendum  I have independently interviewed and examined the patient, and I agree with the physician assistant's findings.  Pt's BP is improving with fluid, suggesting likely volume depletion as etiology for his hypotension.  Will given 1 u pRBC also for his known CAD.  Pt currently without any CAD sx.  Restart Lovenox tomorrow due risk of blow out from diseased distal anastomosis.  Adele Barthel, MD [facility] [place] Office: [phone] Pager: [phone]  08/19/2014, 9:39 AM

## 2014-08-19 NOTE — Progress Notes (Signed)
Pt called out stating that he felt his BP was high. Pt's BP was 82/40, BP was rechecked on other arm and was still low. Dr. Bridgett Larsson with vascular surgery notified, received orders for NS bolus. Will continue to monitor pt's BP.

## 2014-08-20 LAB — RENAL FUNCTION PANEL
ALBUMIN: 2 g/dL — AB (ref 3.5–5.2)
ANION GAP: 16 — AB (ref 5–15)
BUN: 23 mg/dL (ref 6–23)
CHLORIDE: 96 meq/L (ref 96–112)
CO2: 26 mEq/L (ref 19–32)
Calcium: 8.8 mg/dL (ref 8.4–10.5)
Creatinine, Ser: 5.61 mg/dL — ABNORMAL HIGH (ref 0.50–1.35)
GFR, EST AFRICAN AMERICAN: 12 mL/min — AB (ref >90)
GFR, EST NON AFRICAN AMERICAN: 10 mL/min — AB (ref >90)
Glucose, Bld: 104 mg/dL — ABNORMAL HIGH (ref 70–99)
POTASSIUM: 4.4 meq/L (ref 3.7–5.3)
Phosphorus: 5.3 mg/dL — ABNORMAL HIGH (ref 2.3–4.6)
SODIUM: 138 meq/L (ref 137–147)

## 2014-08-20 LAB — GLUCOSE, CAPILLARY: Glucose-Capillary: 96 mg/dL (ref 70–99)

## 2014-08-20 LAB — CBC
HCT: 28.4 % — ABNORMAL LOW (ref 39.0–52.0)
Hemoglobin: 9.1 g/dL — ABNORMAL LOW (ref 13.0–17.0)
MCH: 27.4 pg (ref 26.0–34.0)
MCHC: 32 g/dL (ref 30.0–36.0)
MCV: 85.5 fL (ref 78.0–100.0)
PLATELETS: 176 10*3/uL (ref 150–400)
RBC: 3.32 MIL/uL — AB (ref 4.22–5.81)
RDW: 15.6 % — AB (ref 11.5–15.5)
WBC: 15.6 10*3/uL — AB (ref 4.0–10.5)

## 2014-08-20 LAB — PREPARE RBC (CROSSMATCH)

## 2014-08-20 MED ORDER — ENOXAPARIN SODIUM 30 MG/0.3ML ~~LOC~~ SOLN
30.0000 mg | SUBCUTANEOUS | Status: DC
  Administered 2014-08-20 – 2014-08-25 (×4): 30 mg via SUBCUTANEOUS
  Filled 2014-08-20 (×7): qty 0.3

## 2014-08-20 MED ORDER — SODIUM CHLORIDE 0.9 % IV SOLN
Freq: Once | INTRAVENOUS | Status: AC
  Administered 2014-08-20: 10:00:00 via INTRAVENOUS

## 2014-08-20 NOTE — Progress Notes (Addendum)
Progress Note    08/20/2014 7:27 AM 2 Days Post-Op  Subjective:  States he is just tired this morning.  Says he feels a little better than yesterday  Tm 99.2 HR 80's-90's 881'J-031'R systolic 94% RA   Filed Vitals:   08/20/14 0645  BP: 126/67  Pulse: 111  Temp:   Resp: 21    Physical Exam: Cardiac:  regular Lungs:  Non labored Incisions:  Right groin is soft without hematoma.  Right below knee incision is c/d/i and soft Extremities:  Right calf is soft.  + doppler signal in the right DP/PT (stronger today); new sore on right great toe from bumping the SCD machine on the end of the bed.   CBC    Component Value Date/Time   WBC 15.6* 08/20/2014 0224   RBC 3.32* 08/20/2014 0224   HGB 9.1* 08/20/2014 0224   HCT 28.4* 08/20/2014 0224   PLT 176 08/20/2014 0224   MCV 85.5 08/20/2014 0224   MCH 27.4 08/20/2014 0224   MCHC 32.0 08/20/2014 0224   RDW 15.6* 08/20/2014 0224   LYMPHSABS 1.1 03/23/2013 1136   MONOABS 0.8 03/23/2013 1136   EOSABS 0.1 03/23/2013 1136   BASOSABS 0.0 03/23/2013 1136    BMET    Component Value Date/Time   NA 138 08/20/2014 0224   K 4.4 08/20/2014 0224   CL 96 08/20/2014 0224   CO2 26 08/20/2014 0224   GLUCOSE 104* 08/20/2014 0224   BUN 23 08/20/2014 0224   CREATININE 5.61* 08/20/2014 0224   CREATININE 7.80* 01/16/2014 1437   CALCIUM 8.8 08/20/2014 0224   GFRNONAA 10* 08/20/2014 0224   GFRAA 12* 08/20/2014 0224    INR    Component Value Date/Time   INR 1.18 08/16/2014 1520     Intake/Output Summary (Last 24 hours) at 08/20/14 0727 Last data filed at 08/20/14 0400  Gross per 24 hour  Intake    945 ml  Output      0 ml  Net    945 ml     Assessment:  53 y.o. male is s/p:  Right femoral to below knee popliteal artery bypass graft with 6 mm external ring propatent PTFE  2 Days Post-Op  Plan: -pt doppler signals are stronger today.  His calf remains soft. -he has a new sore on his right great toe - states he bumped it  against the SCD machine on the end of the bed.  Will make sure this is on the floor and not on the foot of the bed.  Discussed with RN. -acute surgical blood loss anemia-Hgb essentially unchanged after 1 unit of blood.  BP tolerating.  Will give additional unit of blood. -leukocytosis slightly worse this am with low grade fever.  His wounds look good this am.  Will recheck tomorrow.  Most likely due to the perioperative phase. -unable to bear weight on the right foot-states he wasn't able to use it before surgery-will need PT to work with him.  Consult is placed. -ABI's are ordered -transfer to 2W -DVT prophylaxis:  Will start Lovenox -check CBC tomorrow   Leontine Locket, PA-C [facility] [phone] 08/20/2014 7:27 AM   Addendum  I have independently interviewed and examined the patient, and I agree with the physician assistant's findings.  BP somewhat labile but much improved throughout most of yesterday, not certain why briefly dipped this AM, current SBP > 120s.  ABI pending  Adele Barthel, MD [facility] [place] Office: [phone] Pager:  [phone]  08/20/2014, 9:05 AM

## 2014-08-20 NOTE — Progress Notes (Signed)
Primary cardiologist: Dr. Shelva Majestic  Seen for followup: Perioperative assessment with CAD   Subjective:    Up in chair today. No chest pain or shortness of breath at rest.  Objective:   Temp:  [97.6 F (36.4 C)-99.2 F (37.3 C)] 98.4 F (36.9 C) (11/29 1036) Pulse Rate:  [77-147] 90 (11/29 0813) Resp:  [13-28] 16 (11/29 1036) BP: (91-143)/(25-103) 108/89 mmHg (11/29 1036) SpO2:  [95 %-100 %] 96 % (11/29 1036) Weight:  [102 lb 1.2 oz (46.3 kg)-111 lb 8.8 oz (50.6 kg)] 111 lb 8.8 oz (50.6 kg) (11/29 0338) Last BM Date: 08/19/14  Filed Weights   08/19/14 1310 08/19/14 1655 08/20/14 0338  Weight: 102 lb 1.2 oz (46.3 kg) 102 lb 1.2 oz (46.3 kg) 111 lb 8.8 oz (50.6 kg)    Intake/Output Summary (Last 24 hours) at 08/20/14 1050 Last data filed at 08/20/14 0400  Gross per 24 hour  Intake    695 ml  Output      0 ml  Net    695 ml    Telemetry: Sinus rhythm.  Exam:  General: Thin, chronically ill appearing male in no distress.  Lungs: Clear, nonlabored breathing.  Cardiac: RRR, no gallop.  Extremities: AVG left thigh, right leg incisions C/D.   Lab Results:  Basic Metabolic Panel:  Recent Labs Lab 08/18/14 0348 08/18/14 1302 08/19/14 0212 08/20/14 0224  NA 136* 132* 135* 138  K 4.3 3.6* 5.0 4.4  CL 93*  --  93* 96  CO2 25  --  19 26  GLUCOSE 121* 96 59* 104*  BUN 40*  --  49* 23  CREATININE 8.40*  --  9.87* 5.61*  CALCIUM 8.8  --  8.8 8.8    Liver Function Tests:  Recent Labs Lab 08/16/14 1520 08/19/14 0212 08/20/14 0224  AST 30  --   --   ALT 23  --   --   ALKPHOS 139*  --   --   BILITOT 0.7  --   --   PROT 7.0  --   --   ALBUMIN 2.6* 2.3* 2.0*    CBC:  Recent Labs Lab 08/18/14 0348 08/18/14 1302 08/19/14 0212 08/20/14 0224  WBC 9.9  --  13.6* 15.6*  HGB 9.7* 9.2* 9.0* 9.1*  HCT 31.2* 27.0* 28.8* 28.4*  MCV 86.9  --  86.0 85.5  PLT 213  --  194 176    ECG: Sinus rhythm with LVH and LAE (11/27).   Medications:     Scheduled Medications: . sodium chloride   Intravenous Once  . aspirin EC  81 mg Oral Daily  . atorvastatin  40 mg Oral q1800  . calcitRIOL  2.75 mcg Oral Once  . calcitRIOL  2.75 mcg Oral Q T,Th,Sa-HD  . calcium acetate  2,001 mg Oral TID WC  . carvedilol  6.25 mg Oral BID WC  . cinacalcet  60 mg Oral Daily  . clopidogrel  75 mg Oral Q breakfast  . [START ON 08/21/2014] colchicine  0.3 mg Oral Once per day on Mon Thu  . darbepoetin (ARANESP) injection - DIALYSIS  60 mcg Intravenous Q Sat-HD  . docusate sodium  100 mg Oral BID  . enoxaparin (LOVENOX) injection  30 mg Subcutaneous Q24H  . feeding supplement (NEPRO CARB STEADY)  237 mL Oral BID BM  . ferric gluconate (FERRLECIT/NULECIT) IV  125 mg Intravenous Q T,Th,Sa-HD  . multivitamin  1 tablet Oral QHS  . nitroGLYCERIN  0.3 mg Transdermal  Daily  . pantoprazole  40 mg Oral Daily  . predniSONE  5 mg Oral Daily    Infusions: . sodium chloride Stopped (08/18/14 1430)    PRN Medications: acetaminophen **OR** acetaminophen, guaiFENesin-dextromethorphan, hydrALAZINE, labetalol, metoprolol, morphine injection, ondansetron, oxyCODONE, phenol   Assessment:   1. Status post right fem-pop bypass 11/27..  2. ESRD on HD per Nephrology.  3. CAD s/p DES to RCA April 2015. Plavix resumed.  4. Essential hypertension, back on Coreg. Norvasc stopped for now.   Plan/Discussion:    Continue ASA, Plavix, Coreg and Lipitor. Norvasc stopped for now - follow BP.   Satira Sark, M.D., F.A.C.C.

## 2014-08-20 NOTE — Plan of Care (Signed)
Problem: Phase I Progression Outcomes Goal: Graft patent without s/s of occlusion Outcome: Progressing Goal: Tolerates diet without nausea/vomiting Outcome: Progressing Goal: If Diabetic, blood sugar < 150 Outcome: Not Applicable Date Met:  59/53/96 Goal: Weaning O2 to maintain Sats > 90% Outcome: Completed/Met Date Met:  08/20/14 Goal: Pain controlled with appropriate interventions Outcome: Progressing Goal: Hemodynamically stable Outcome: Completed/Met Date Met:  08/20/14

## 2014-08-20 NOTE — Progress Notes (Signed)
[facility] Rounding Note Subjective:   POD #2 fem pop bypass right.  Extensive calcification at time of surgery. Doppler signals said to be stronger today BP's intermittently soft Off amlodipine Got a unit of blood in the OR, and a second unit yesterday and Hb has not bumped at all (9.1 again today) Says Dr. Bridgett Larsson is giving him another unit today No chest pain or SOB Had dialysis yesterday - no volume was removed. Got his foot tangled up in his SCD and scraped the skin off his right great toe Lovenox starting today  Objective Vital signs in last 24 hours: Filed Vitals:   08/20/14 0338 08/20/14 0435 08/20/14 0645 08/20/14 0813  BP: 100/34 143/45 126/67 115/60  Pulse: 98 98 111 90  Temp: 99.2 F (37.3 C)     TempSrc: Oral     Resp: 19 15 21 18   Height:      Weight: 50.6 kg (111 lb 8.8 oz)     SpO2: 95% 96% 95% 97%   Weight change: -5.4 kg (-11 lb 14.5 oz)  47.5->46.4 with HD 11/25 (EDW 46.5 kg)  Intake/Output Summary (Last 24 hours) at 08/20/14 0925 Last data filed at 08/20/14 0400  Gross per 24 hour  Intake    695 ml  Output      0 ml  Net    695 ml    Physical Exam: BP 115/60 mmHg  Pulse 90  Temp(Src) 99.2 F (37.3 C) (Oral)  Resp 18  Ht 5\' 11"  (1.803 m)  Wt 50.6 kg (111 lb 8.8 oz)  BMI 15.57 kg/m2  SpO2 97% General appearance: alert, cooperative and no distress. Some pain at incision sites but no more pain in foot Neck: No JVD  Resp: clear to auscultation bilaterally Cardio: regular rate and rhythm, S1, S2 normal, no murmur, click, rub or gallop. Tachy 90's today GI: soft, non-tender; bowel sounds normal; midline surgical scar, no organomegaly Extremities: Neurologic: Grossly normal Dialysis Access: Left thigh AVG with + bruit  Incisions in right groin and right beside the knee dry (+ doppler signal by report right DP/PT) New sore right great toe (scraped on his SCD's) Left foot with swelling, dry gangrene changes of left great  toe Labs: Basic Metabolic Panel:  Recent Labs Lab 08/13/14 1051 08/15/14 0759 08/16/14 1520 08/18/14 0348 08/18/14 1302 08/19/14 0212 08/20/14 0224  NA 144 136* 135* 136* 132* 135* 138  K 3.9 4.5 4.5 4.3 3.6* 5.0 4.4  CL 96 94* 88* 93*  --  93* 96  CO2 30  --  22 25  --  19 26  GLUCOSE 84 75 107* 121* 96 59* 104*  BUN 32* 37* 59* 40*  --  49* 23  CREATININE 6.46* 6.30* 9.43* 8.40*  --  9.87* 5.61*  CALCIUM 8.8  --  8.5 8.8  --  8.8 8.8  PHOS  --   --   --   --   --  5.7* 5.3*   Liver Function Tests:  Recent Labs Lab 08/16/14 1520 08/19/14 0212 08/20/14 0224  AST 30  --   --   ALT 23  --   --   ALKPHOS 139*  --   --   BILITOT 0.7  --   --   PROT 7.0  --   --   ALBUMIN 2.6* 2.3* 2.0*    Recent Labs Lab 08/17/14 0344 08/18/14 0348 08/18/14 1302 08/19/14 0212 08/20/14 0224  WBC 10.9* 9.9  --  13.6* 15.6*  HGB 10.8* 9.7* 9.2* 9.0* 9.1*  HCT 34.2* 31.2* 27.0* 28.8* 28.4*  MCV 88.1 86.9  --  86.0 85.5  PLT 234 213  --  194 176   Scheduled Medications: . sodium chloride Stopped (08/18/14 1430)   . sodium chloride   Intravenous Once  . sodium chloride   Intravenous Once  . aspirin EC  81 mg Oral Daily  . atorvastatin  40 mg Oral q1800  . calcitRIOL  2.75 mcg Oral Once  . calcitRIOL  2.75 mcg Oral Q T,Th,Sa-HD  . calcium acetate  2,001 mg Oral TID WC  . carvedilol  6.25 mg Oral BID WC  . cinacalcet  60 mg Oral Daily  . clopidogrel  75 mg Oral Q breakfast  . [START ON 08/21/2014] colchicine  0.3 mg Oral Once per day on Mon Thu  . darbepoetin (ARANESP) injection - DIALYSIS  60 mcg Intravenous Q Sat-HD  . docusate sodium  100 mg Oral BID  . enoxaparin (LOVENOX) injection  30 mg Subcutaneous Q24H  . feeding supplement (NEPRO CARB STEADY)  237 mL Oral BID BM  . ferric gluconate (FERRLECIT/NULECIT) IV  125 mg Intravenous Q T,Th,Sa-HD  . multivitamin  1 tablet Oral QHS  . nitroGLYCERIN  0.3 mg Transdermal Daily  . pantoprazole  40 mg Oral Daily  . predniSONE  5  mg Oral Daily   Dialysis Orders: TTS @ [facility] 3:30 46.5 kg 2K/2Ca 400/800 Profile 2 Heparin 4000 U L thigh AVG  Calcitriol 2.75 mcg Aranesp 25 mcg on Thurs Venofer 100 mg through 12/3, then qwk  Background 53 y.o. AA male with a history of peripheral vascular disease, CAD s/p PCI in 12/2013, CHF, Hepatitis C, small bowel obstruction s/p exploratory laparotomy in 03/2012, and ESRD on dialysis at the [facility] who had drug-coated balloon angioplasty of the right popliteal artery per Dr. Trula Slade on 11/11, but returned to the ED 11/22 with worsening right foot pain and was found to have significant plaque with reduced flows throughout the right lower extremity, similar to previous studies at [facility], but was scheduled for right lower extremity angiogram 11/24 per Dr. Bridgett Larsson. Right leg runoff showed heavily calcified right arterial system with multiple degrees of stenosis, including an occluded right popliteal artery. Patient was unable to stand and thus unable to attend dialysis 11/25 secondary to severe right foot pain and was admitted to the hospital for pain management/IV heparin/intervention. Underwent right fem-pop on 11/27 with findings of extensive calcification   Assessment/Plan: 1. PVD - s/p balloon angioplasty L popliteal artery 10/28 & R popliteal artery 11/11, s/p R leg runoff 11/24 per Dr. Bridgett Larsson, w/occluded popliteal artery. Severe rest pain in the right foot.  S/P  OR 11/27 for right fem-pop. Extensive calcification and diseased anastamotic site. Per [facility]. + doppler signal. Foot warm. Starting lovenox today for DVT prophy 2. Hypotension - occurred 11/28 on bedside commode. [facility] gave volume and blood with response. No volume was removed with dialysis. Amlodipine stopped.  BP's overall better but still with orthostasis.   3. BP/volume - Remains on Carvedilol 6.25 mg bid (cardioprotection). Amlodipine stopped d/t hypotension. 4 kg above EDW since  yesterday  (if weights believable - I doubt validity) but no edema or JVD and lungs clear. 4. ESRD - HD on TTS @ [place]. Tight heparin with next treatment on Tuesday 5. Leukocytosis - progressive since surgery.  WBC up to 15,600. Surgical sites look good on right. Dry gangrene of great toe on left but not draining.  Watch 6. Anemia - Hgb 10.8 on adm. , Aranesp 25 mcg on Thurs (today per holiday schedule), Venofer loading through 12/3. Hb dropped to nadir of 9 post op. He rec'd 1 unit in the OR, 1 unit on 11/28. Hb has not bumped. To get a 3rd unit today (cardiac concerns). Increased Aranesp to 60. 7. Metabolic bone disease - Last  iPTH 713; Calcitriol 2.75 mcg, Sensipar 60 mg qd, Phoslo 4 with meals. 8. Nutrition - Last Alb 2.6, renal diet, vitamin. 9. Hx CAD - s/p cath w/ PCI 12/2013 (RCA rotablator PCI and DES X3). No chest pain. 10. Gout - on colchicine. Not currently bothering him  Jamal Maes, MD [facility] [phone] pager 08/20/2014, 9:25 AM

## 2014-08-20 NOTE — Evaluation (Signed)
Physical Therapy Evaluation Patient Details Name: [name] MRN: [id number] DOB: [birth year] Today's Date: 08/20/2014   History of Present Illness  [name] is a 53 y.o. AA male with a history of peripheral vascular disease, CAD s/p PCI in 12/2013,  CHF, Hepatitis C, small bowel obstruction s/p exploratory laparotomy in 03/2012, and ESRD on dialysis at the [facility] who had drug-coated balloon angioplasty of the right popliteal artery per Dr. Trula Slade on 11/11, but returned to the ED 11/22 with worsening right foot pain and was found to have significant plaque with reduced flows throughout the right lower extremity, similar to previous studies at [facility], but was scheduled for right lower extremity angiogram yesterday per Dr. Bridgett Larsson.  Right leg runoff showed heavily calcified right arterial system with multiple degrees of stenosis, including an occluded right popliteal artery.  Today the patient was unable to stand to attend dialysis secondary to severe right foot pain and was admitted to the hospital for management.  S/p R fem-pop BPGing.  Clinical Impression  Pt admitted with/for R LE ischemia, s/p fem-pop BPG.  Pt currently limited functionally due to the problems listed below.  (see problems list.)  Pt will benefit from PT to maximize function and safety to be able to get home safely with available assist of family.     Follow Up Recommendations No PT follow up    Equipment Recommendations   (3 in 1, )    Recommendations for Other Services       Precautions / Restrictions Precautions Precautions: Fall      Mobility  Bed Mobility Overal bed mobility: Modified Independent             General bed mobility comments: slow and painful  Transfers Overall transfer level: Needs assistance   Transfers: Sit to/from Stand Sit to Stand: Supervision            Ambulation/Gait Ambulation/Gait assistance: Min guard;Min assist (min initially) Ambulation Distance  (Feet): 15 Feet (times 2) Assistive device: Rolling walker (2 wheeled) Gait Pattern/deviations: Step-to pattern     General Gait Details: pt only able to place forefoot on the floor due to pain.  Very little weightbearing.  Stairs            Wheelchair Mobility    Modified Rankin (Stroke Patients Only)       Balance Overall balance assessment: No apparent balance deficits (not formally assessed)                                           Pertinent Vitals/Pain Pain Assessment: Faces Faces Pain Scale: Hurts whole lot Pain Location: R leg below knee to foot Pain Descriptors / Indicators: Heaviness;Throbbing Pain Intervention(s): Limited activity within patient's tolerance    Home Living Family/patient expects to be discharged to:: Private residence Living Arrangements: Other (Comment);Other relatives (Sister) Available Help at Discharge: Family;Other (Comment) (sister as needed) Type of Home: House Home Access: Stairs to enter Entrance Stairs-Rails: Can reach both Entrance Stairs-Number of Steps: 2 Home Layout: One level Home Equipment: Walker - 2 wheels Additional Comments: has recently had to use RW to get to HD    Prior Function Level of Independence: Independent with assistive device(s) (but in distress)               Hand Dominance        Extremity/Trunk Assessment  Upper Extremity Assessment: Defer to OT evaluation (Uses UE's well to help with transfer)           Lower Extremity Assessment: Generalized weakness;Overall WFL for tasks assessed         Communication   Communication: No difficulties  Cognition Arousal/Alertness: Awake/alert Behavior During Therapy: WFL for tasks assessed/performed Overall Cognitive Status: Within Functional Limits for tasks assessed                      General Comments      Exercises        Assessment/Plan    PT Assessment Patient needs continued PT services  PT  Diagnosis Difficulty walking;Acute pain   PT Problem List Decreased strength;Decreased activity tolerance;Decreased mobility;Decreased knowledge of use of DME;Pain  PT Treatment Interventions DME instruction;Gait training;Stair training;Functional mobility training;Therapeutic activities;Patient/family education   PT Goals (Current goals can be found in the Care Plan section) Acute Rehab PT Goals Patient Stated Goal: For this leg pain to decrease. PT Goal Formulation: With patient Time For Goal Achievement: 08/27/14 Potential to Achieve Goals: Good    Frequency Min 3X/week   Barriers to discharge Other (comment) (stairs may pose a problem)      Co-evaluation               End of Session   Activity Tolerance: Patient limited by pain;Patient tolerated treatment well Patient left: in bed Nurse Communication: Mobility status         Time: 0937-1000 PT Time Calculation (min) (ACUTE ONLY): 23 min   Charges:   PT Evaluation $Initial PT Evaluation Tier I: 1 Procedure PT Treatments $Gait Training: 8-22 mins   PT G Codes:          Yeng Frankie, Tessie Fass 08/20/2014, 10:14 AM 08/20/2014  Donnella Sham, PT [phone]  (pager)

## 2014-08-21 ENCOUNTER — Encounter (HOSPITAL_COMMUNITY): Payer: Self-pay | Admitting: Surgery

## 2014-08-21 ENCOUNTER — Encounter: Payer: Medicare Other | Admitting: Surgery

## 2014-08-21 DIAGNOSIS — I739 Peripheral vascular disease, unspecified: Secondary | ICD-10-CM

## 2014-08-21 DIAGNOSIS — I96 Gangrene, not elsewhere classified: Secondary | ICD-10-CM

## 2014-08-21 LAB — TYPE AND SCREEN
ABO/RH(D): B POS
ANTIBODY SCREEN: POSITIVE
DAT, IgG: NEGATIVE
DONOR AG TYPE: NEGATIVE
Donor AG Type: NEGATIVE
Donor AG Type: NEGATIVE
Unit division: 0
Unit division: 0
Unit division: 0

## 2014-08-21 LAB — CBC
HCT: 29.4 % — ABNORMAL LOW (ref 39.0–52.0)
Hemoglobin: 9.6 g/dL — ABNORMAL LOW (ref 13.0–17.0)
MCH: 28 pg (ref 26.0–34.0)
MCHC: 32.7 g/dL (ref 30.0–36.0)
MCV: 85.7 fL (ref 78.0–100.0)
PLATELETS: 219 10*3/uL (ref 150–400)
RBC: 3.43 MIL/uL — AB (ref 4.22–5.81)
RDW: 16.2 % — AB (ref 11.5–15.5)
WBC: 13.5 10*3/uL — ABNORMAL HIGH (ref 4.0–10.5)

## 2014-08-21 LAB — RENAL FUNCTION PANEL
ALBUMIN: 2 g/dL — AB (ref 3.5–5.2)
Anion gap: 18 — ABNORMAL HIGH (ref 5–15)
BUN: 43 mg/dL — ABNORMAL HIGH (ref 6–23)
CO2: 24 mEq/L (ref 19–32)
CREATININE: 7.86 mg/dL — AB (ref 0.50–1.35)
Calcium: 8.9 mg/dL (ref 8.4–10.5)
Chloride: 93 mEq/L — ABNORMAL LOW (ref 96–112)
GFR calc non Af Amer: 7 mL/min — ABNORMAL LOW (ref >90)
GFR, EST AFRICAN AMERICAN: 8 mL/min — AB (ref >90)
Glucose, Bld: 100 mg/dL — ABNORMAL HIGH (ref 70–99)
Phosphorus: 5.6 mg/dL — ABNORMAL HIGH (ref 2.3–4.6)
Potassium: 4.9 mEq/L (ref 3.7–5.3)
Sodium: 135 mEq/L — ABNORMAL LOW (ref 137–147)

## 2014-08-21 MED ORDER — HEPARIN SODIUM (PORCINE) 1000 UNIT/ML DIALYSIS
4000.0000 [IU] | Freq: Once | INTRAMUSCULAR | Status: DC
  Filled 2014-08-21: qty 4

## 2014-08-21 MED ORDER — LIDOCAINE HCL (PF) 1 % IJ SOLN: 5.0000 mL | INTRAMUSCULAR | Status: DC | PRN

## 2014-08-21 MED ORDER — LIDOCAINE-PRILOCAINE 2.5-2.5 % EX CREA: 1.0000 "application " | TOPICAL_CREAM | CUTANEOUS | Status: DC | PRN

## 2014-08-21 MED ORDER — HEPARIN SODIUM (PORCINE) 1000 UNIT/ML DIALYSIS
1000.0000 [IU] | INTRAMUSCULAR | Status: DC | PRN
  Filled 2014-08-21: qty 1

## 2014-08-21 MED ORDER — SODIUM CHLORIDE 0.9 % IV SOLN: 100.0000 mL | INTRAVENOUS | Status: DC | PRN

## 2014-08-21 MED ORDER — ALTEPLASE 2 MG IJ SOLR
2.0000 mg | Freq: Once | INTRAMUSCULAR | Status: AC | PRN
  Filled 2014-08-21: qty 2

## 2014-08-21 MED ORDER — NEPRO/CARBSTEADY PO LIQD
237.0000 mL | ORAL | Status: DC | PRN
  Filled 2014-08-21: qty 237

## 2014-08-21 MED ORDER — PENTAFLUOROPROP-TETRAFLUOROETH EX AERO: 1.0000 "application " | INHALATION_SPRAY | CUTANEOUS | Status: DC | PRN

## 2014-08-21 NOTE — Progress Notes (Signed)
Patient to transfer to 2W36 report given to receiving nurse Theadora Rama all questions answered at this time.  Patient is stable for transfer.

## 2014-08-21 NOTE — Plan of Care (Signed)
Problem: Phase I Progression Outcomes Goal: Voiding-avoid urinary catheter unless indicated Outcome: Not Applicable Date Met:  68/37/29 Patient is a HD patient   Problem: Phase II Progression Outcomes Goal: Discharge plan in place and appropriate Outcome: Progressing Goal: Pain controlled with appropriate interventions Outcome: Completed/Met Date Met:  08/21/14 Goal: Hemodynamically stable Outcome: Completed/Met Date Met:  08/21/14

## 2014-08-21 NOTE — Progress Notes (Signed)
[facility] Progress Note   Subjective: no complaints, R foot still hurts a lot  Filed Vitals:   08/20/14 1800 08/20/14 1900 08/21/14 0011 08/21/14 0344  BP: 134/47 152/71 121/31 127/68  Pulse: 139 83 80 78  Temp:  98.2 F (36.8 C) 99.3 F (37.4 C) 98.7 F (37.1 C)  TempSrc:  Oral Oral Oral  Resp: 9 19 17 21   Height:      Weight:    49.7 kg (109 lb 9.1 oz)  SpO2: 98% 100% 96% 98%   Exam: Calm, alert, thin AAM, no distress No jvd Chest clear bilat RRR no MRG ABd soft, NTND, +BS +erythema/ edema R foot Mild digital gangrene L great toe L thigh AVG +bruit No pretib edema  HD: TTS [facility] 3.5h 46.5 kg 2K/2Ca 400/800 Profile 2 Heparin 4000 U L thigh AVG  Calcitriol 2.75 mcg Aranesp 25 mcg on Thurs Venofer 100 mg through 12/3, then qwk  Assessment: 1. PVD / Ischemic R foot - s/p R fem-pop BPG 11/27 2. PVD / ischemia L foot - s/p angioplasty of 2 vessels per [facility] (10/28, 11/11) 3. Hypotension - resolved, holding amlod 4. ESRD on HD 5. Volume - wt's inaccurate, no vol excess 6. [employer] - since surgery, down a little today. Low-grade fevers.  Not on abx 7. Anemia ^'d aranesp to 60/wk, cont IV Fe, has rec'd 3u prbc's 8. Sec HPTH pth 713, cont vit D, sensipar, phoslo 9. Hx CAD , w/p PCI/ stents  Plan- HD tomorrow. Low threshold to start antibiotics if he develops sig fever or other signs of infection    [name] MD  pager [phone]    cell [phone]  08/21/2014, 8:44 AM     Recent Labs Lab 08/19/14 0212 08/20/14 0224 08/21/14 0258  NA 135* 138 135*  K 5.0 4.4 4.9  CL 93* 96 93*  CO2 19 26 24   GLUCOSE 59* 104* 100*  BUN 49* 23 43*  CREATININE 9.87* 5.61* 7.86*  CALCIUM 8.8 8.8 8.9  PHOS 5.7* 5.3* 5.6*    Recent Labs Lab 08/16/14 1520 08/19/14 0212 08/20/14 0224 08/21/14 0258  AST 30  --   --   --   ALT 23  --   --   --   ALKPHOS 139*  --   --   --   BILITOT 0.7  --   --   --   PROT 7.0  --    --   --   ALBUMIN 2.6* 2.3* 2.0* 2.0*    Recent Labs Lab 08/19/14 0212 08/20/14 0224 08/21/14 0258  WBC 13.6* 15.6* 13.5*  HGB 9.0* 9.1* 9.6*  HCT 28.8* 28.4* 29.4*  MCV 86.0 85.5 85.7  PLT 194 176 219   . sodium chloride   Intravenous Once  . aspirin EC  81 mg Oral Daily  . atorvastatin  40 mg Oral q1800  . calcitRIOL  2.75 mcg Oral Once  . calcitRIOL  2.75 mcg Oral Q T,Th,Sa-HD  . calcium acetate  2,001 mg Oral TID WC  . carvedilol  6.25 mg Oral BID WC  . cinacalcet  60 mg Oral Daily  . clopidogrel  75 mg Oral Q breakfast  . colchicine  0.3 mg Oral Once per day on Mon Thu  . darbepoetin (ARANESP) injection - DIALYSIS  60 mcg Intravenous Q Sat-HD  . docusate sodium  100 mg Oral BID  . enoxaparin (LOVENOX) injection  30 mg Subcutaneous Q24H  . feeding supplement (NEPRO CARB STEADY)  237 mL Oral BID BM  . ferric gluconate (FERRLECIT/NULECIT) IV  125 mg Intravenous Q T,Th,Sa-HD  . multivitamin  1 tablet Oral QHS  . nitroGLYCERIN  0.3 mg Transdermal Daily  . pantoprazole  40 mg Oral Daily  . predniSONE  5 mg Oral Daily   . sodium chloride Stopped (08/18/14 1430)   acetaminophen **OR** acetaminophen, guaiFENesin-dextromethorphan, hydrALAZINE, labetalol, metoprolol, morphine injection, ondansetron, oxyCODONE, phenol

## 2014-08-21 NOTE — Evaluation (Signed)
Occupational Therapy Evaluation Patient Details Name: [name] MRN: [id number] DOB: 12-Aug-1961 Today's Date: 08/21/2014    History of Present Illness [name] is a 53 y.o. AA male with a history of peripheral vascular disease, CAD s/p PCI in 12/2013,  CHF, Hepatitis C, SBO and ESRD. He had angioplasty of the right popliteal artery per Dr. Trula Slade on 11/11, but returned to the ED 11/22 with worsening right foot pain and was found to have significant plaque with reduced flows throughout the RLE. 11/25 Pt was unable to stand to attend dialysis secondary to severe right foot pain and was admitted for management.  S/p R fem-pop BPG 11/27   Clinical Impression   Pt s/p above. Feel pt will benefit from acute OT to increase independence with BADLs prior to d/c. Recommending SNF for rehab but pt refusing to go so recommending [facility] upon d/c if pt continues to refuse. Pt changing what he says regarding family availability for assistance.    Follow Up Recommendations  SNF;Supervision/Assistance - 24 hour    Equipment Recommendations  3 in 1 bedside comode    Recommendations for Other Services       Precautions / Restrictions Precautions Precautions: Fall Restrictions Weight Bearing Restrictions: No      Mobility Bed Mobility               General bed mobility comments: not assessed  Transfers Overall transfer level: Needs assistance Equipment used: Rolling walker (2 wheeled) Transfers: Sit to/from Stand Sit to Stand: Min guard         General transfer comment: Min guard for safety.  Explained technique that may cause less pain.    Balance                                            ADL Overall ADL's : Needs assistance/impaired     Grooming: Wash/dry face;Oral care;Set up;Supervision/safety;Sitting               Lower Body Dressing: Minimal assistance;Sitting/lateral leans   Toilet Transfer: Minimal assistance;Ambulation;RW;BSC    Toileting- Clothing Manipulation and Hygiene: Minimal assistance;Sitting/lateral lean       Functional mobility during ADLs: Minimal assistance;Rolling walker General ADL Comments: Pt requesting to sit once at sink stating that he could not stand for grooming tasks. Performed some ADLs sitting. Educated on LB dressing technique. Educated on safety (safe shoewear, use of bag on walker, rugs, pet, sitting for most of LB ADLs). Discussed alternative techniques for LB ADLs (rolling, leaning). Discussed d/c recommendation.  Explained 3 in 1. Pt requiring increased time. Pt verbalizing that he does not want to go to SNF and reasoning for this. Told pt to elevate RLE and elevated for him at end of session.     Vision                     Perception     Praxis      Pertinent Vitals/Pain Pain Assessment: 0-10 Pain Score: 8  Pain Location: RLE Pain Intervention(s): RN gave pain meds during session;Repositioned;Monitored during session     Hand Dominance     Extremity/Trunk Assessment Upper Extremity Assessment Upper Extremity Assessment: Overall WFL for tasks assessed   Lower Extremity Assessment Lower Extremity Assessment: Defer to PT evaluation       Communication Communication Communication: No difficulties   Cognition Arousal/Alertness:  Awake/alert Behavior During Therapy: Flat affect Overall Cognitive Status: No family/caregiver present to determine baseline cognitive functioning Area of Impairment: Problem solving;Attention   Current Attention Level: Sustained         Problem Solving: Slow processing     General Comments       Exercises       Shoulder Instructions      Home Living Family/patient expects to be discharged to:: Private residence Living Arrangements: Other (Comment) (sister) Available Help at Discharge: Family;Other (Comment) (sister as needed) Type of Home: House Home Access: Stairs to enter [employer] of Steps: 2 Entrance  Stairs-Rails: Can reach both Home Layout: One level     Bathroom Shower/Tub: Teacher, early years/pre: Standard     Home Equipment: Environmental consultant - 2 wheels   Additional Comments: has recently had to use RW to get to HD      Prior Functioning/Environment Level of Independence: Independent with assistive device(s) (but in distress)             OT Diagnosis: Acute pain   OT Problem List: Decreased activity tolerance;Decreased knowledge of use of DME or AE;Decreased knowledge of precautions;Pain;Decreased cognition;Decreased strength   OT Treatment/Interventions: Self-care/ADL training;DME and/or AE instruction;Patient/family education;Balance training;Therapeutic activities;Cognitive remediation/compensation;Therapeutic exercise    OT Goals(Current goals can be found in the care plan section) Acute Rehab OT Goals Patient Stated Goal: not stated OT Goal Formulation: With patient Time For Goal Achievement: 08/28/14 Potential to Achieve Goals: Good ADL Goals Pt Will Perform Lower Body Dressing: sit to/from stand;sitting/lateral leans;with set-up Pt Will Transfer to Toilet: with supervision;ambulating;bedside commode Pt Will Perform Toileting - Clothing Manipulation and hygiene: with set-up;sitting/lateral leans;sit to/from stand  OT Frequency: Min 2X/week   Barriers to D/C:            Co-evaluation              End of Session Equipment Utilized During Treatment: Gait belt;Rolling walker Nurse Communication: Patient requests pain meds  Activity Tolerance: Patient limited by pain Patient left: in chair;with call bell/phone within reach   Time: 1551-1630 OT Time Calculation (min): 39 min Charges:  OT General Charges $OT Visit: 1 Procedure OT Evaluation $Initial OT Evaluation Tier I: 1 Procedure OT Treatments $Self Care/Home Management : 8-22 mins G-CodesBenito Mccreedy OTR/L [phone] 08/21/2014, 5:48 PM

## 2014-08-21 NOTE — Progress Notes (Signed)
Utilization review completed.  

## 2014-08-21 NOTE — Progress Notes (Signed)
Physical Therapy Treatment Patient Details Name: [name] MRN: [id number] DOB: [birth year] Today's Date: 08/21/2014    History of Present Illness [name] is a 53 y.o. AA male with a history of peripheral vascular disease, CAD s/p PCI in 12/2013,  CHF, Hepatitis C, SBO and ESRD. He had angioplasty of the right popliteal artery per Dr. Trula Slade on 11/11, but returned to the ED 11/22 with worsening right foot pain and was found to have significant plaque with reduced flows throughout the RLE. 11/25 Pt was unable to stand to attend dialysis secondary to severe right foot pain and was admitted for management.  S/p R fem-pop BPG 11/27    PT Comments    Pt pleasant but requires increased time for all transfers and between transfers. Pt hesitant with mobility fearful of pain and benefits from education not at time of mobility due to difficulty with attention. Will continue to follow to maximize mobility and function.   Follow Up Recommendations  SNF;Home health PT (feel pt would benefit from SNF but he denies and recommend HHPT for return home)     Equipment Recommendations       Recommendations for Other Services       Precautions / Restrictions Precautions Precautions: Fall    Mobility  Bed Mobility Overal bed mobility: Modified Independent             General bed mobility comments: slow and painful  Transfers Overall transfer level: Needs assistance   Transfers: Sit to/from Stand;Stand Pivot Transfers Sit to Stand: Min guard Stand pivot transfers: Min guard       General transfer comment: cues for hand placement, backing to surface and safety with transfers. Pt sitting prior to arriving at surface and wanting to keep both hands on RW throughout transfers. 3 attempts with pt able to complete correctly on 3rd  Ambulation/Gait Ambulation/Gait assistance: Min assist Ambulation Distance (Feet): 8 Feet (2', 8', 8') Assistive device: Rolling walker (2 wheeled) Gait  Pattern/deviations: Step-to pattern   Gait velocity interpretation: Below normal speed for age/gender General Gait Details: Pt initially took a couple steps away from chair hopping on LLE and became distracted and upset with cues to place RLE on floor and reached back for chair without fully backing and sat. Pt then walked 8'x 2 with seated rest able to maintain RLE on ground but truly TDWB   Stairs            Wheelchair Mobility    Modified Rankin (Stroke Patients Only)       Balance Overall balance assessment: Needs assistance   Sitting balance-Leahy Scale: Fair       Standing balance-Leahy Scale: Poor                      Cognition Arousal/Alertness: Awake/alert Behavior During Therapy: Flat affect Overall Cognitive Status: Impaired/Different from baseline Area of Impairment: Problem solving;Attention   Current Attention Level: Sustained         Problem Solving: Slow processing;Requires verbal cues General Comments: pt could not divide attention with task involving walking and cues at the same time. benefits from education before or after mobility task    Exercises General Exercises - Lower Extremity Long Arc Quad: AROM;Right;10 reps;Seated    General Comments        Pertinent Vitals/Pain Faces Pain Scale: Hurts whole lot Pain Location: RLE popliteal area and foot Pain Descriptors / Indicators: Throbbing;Aching Pain Intervention(s): Premedicated before session;Repositioned    Home  Living                      Prior Function            PT Goals (current goals can now be found in the care plan section) Progress towards PT goals: Progressing toward goals (very slowly)    Frequency       PT Plan Discharge plan needs to be updated    Co-evaluation             End of Session Equipment Utilized During Treatment: Gait belt Activity Tolerance: Patient limited by pain Patient left: in chair;with call bell/phone within reach      Time: 1215-1245 PT Time Calculation (min) (ACUTE ONLY): 30 min  Charges:  $Gait Training: 8-22 mins $Therapeutic Activity: 8-22 mins                    G Codes:      Melford Aase 09-02-14, 1:06 PM Elwyn Reach, [place]

## 2014-08-21 NOTE — Progress Notes (Signed)
VASCULAR LAB PRELIMINARY  ARTERIAL  ABI completed:    RIGHT    LEFT    PRESSURE WAVEFORM  PRESSURE WAVEFORM  BRACHIAL 155 triphasic BRACHIAL  triphasic  DP   DP    AT Non compressible Dampened monophasic AT Non compressible monophasic  PT Non compressible Dampened monophasic PT Non compressible monophasic  PER   PER    GREAT TOE  NA GREAT TOE  NA    RIGHT LEFT  ABI N/A due to calcified vessels N/A due to calcified vessels     Evaleen Sant, RVT 08/21/2014, 12:34 PM

## 2014-08-21 NOTE — Progress Notes (Addendum)
  Progress Note    08/21/2014 7:34 AM 3 Days Post-Op  Subjective:  No complaints-appears down  Tm 99.3 now afebrile HR 70's-80's NSR 924'Q-683'M systolic 19% RA  Filed Vitals:   08/21/14 0344  BP: 127/68  Pulse: 78  Temp: 98.7 F (37.1 C)  Resp: 21    Physical Exam: Lungs:  Non labored Incisions:  Healing nicely Extremities:  Right foot is warm.  Right calf is soft.  Sore on right great toe is unchanged from yesterday.   CBC    Component Value Date/Time   WBC 13.5* 08/21/2014 0258   RBC 3.43* 08/21/2014 0258   HGB 9.6* 08/21/2014 0258   HCT 29.4* 08/21/2014 0258   PLT 219 08/21/2014 0258   MCV 85.7 08/21/2014 0258   MCH 28.0 08/21/2014 0258   MCHC 32.7 08/21/2014 0258   RDW 16.2* 08/21/2014 0258   LYMPHSABS 1.1 03/23/2013 1136   MONOABS 0.8 03/23/2013 1136   EOSABS 0.1 03/23/2013 1136   BASOSABS 0.0 03/23/2013 1136    BMET    Component Value Date/Time   NA 135* 08/21/2014 0258   K 4.9 08/21/2014 0258   CL 93* 08/21/2014 0258   CO2 24 08/21/2014 0258   GLUCOSE 100* 08/21/2014 0258   BUN 43* 08/21/2014 0258   CREATININE 7.86* 08/21/2014 0258   CREATININE 7.80* 01/16/2014 1437   CALCIUM 8.9 08/21/2014 0258   GFRNONAA 7* 08/21/2014 0258   GFRAA 8* 08/21/2014 0258    INR    Component Value Date/Time   INR 1.18 08/16/2014 1520     Intake/Output Summary (Last 24 hours) at 08/21/14 0734 Last data filed at 08/21/14 0000  Gross per 24 hour  Intake    416 ml  Output      0 ml  Net    416 ml     Assessment:  53 y.o. male is s/p:  Right femoral to below knee popliteal artery bypass graft with 6 mm external ring propatent PTFE  3 Days Post-Op  Plan: -pt doing well - right foot is warm and calf is soft -had issues with IV yesterday and blood administration.  New IV access obtained and blood administration was finished.   -sore on right toe unchanged. -acute surgical blood loss anemia-Hgb improved this am to 9.6 from 9.1 -leukocytosis improved  this am -states he stood for a minute with PT-did well with this -ABI's pending -HD per renal -pt states he has not had any more BP issues since yesterday morning. -transfer to 2W -DVT prophylaxis:  Lovenox   Leontine Locket, PA-C [facility] [phone] 08/21/2014 7:34 AM

## 2014-08-22 LAB — CBC
HCT: 29.7 % — ABNORMAL LOW (ref 39.0–52.0)
Hemoglobin: 9.7 g/dL — ABNORMAL LOW (ref 13.0–17.0)
MCH: 28.4 pg (ref 26.0–34.0)
MCHC: 32.7 g/dL (ref 30.0–36.0)
MCV: 87.1 fL (ref 78.0–100.0)
PLATELETS: 152 10*3/uL (ref 150–400)
RBC: 3.41 MIL/uL — ABNORMAL LOW (ref 4.22–5.81)
RDW: 15.6 % — AB (ref 11.5–15.5)
WBC: 12.8 10*3/uL — ABNORMAL HIGH (ref 4.0–10.5)

## 2014-08-22 LAB — RENAL FUNCTION PANEL
Albumin: 2 g/dL — ABNORMAL LOW (ref 3.5–5.2)
Anion gap: 20 — ABNORMAL HIGH (ref 5–15)
BUN: 68 mg/dL — ABNORMAL HIGH (ref 6–23)
CALCIUM: 8.5 mg/dL (ref 8.4–10.5)
CO2: 24 meq/L (ref 19–32)
Chloride: 91 mEq/L — ABNORMAL LOW (ref 96–112)
Creatinine, Ser: 10.49 mg/dL — ABNORMAL HIGH (ref 0.50–1.35)
GFR, EST AFRICAN AMERICAN: 6 mL/min — AB (ref >90)
GFR, EST NON AFRICAN AMERICAN: 5 mL/min — AB (ref >90)
Glucose, Bld: 115 mg/dL — ABNORMAL HIGH (ref 70–99)
POTASSIUM: 4.8 meq/L (ref 3.7–5.3)
Phosphorus: 5.3 mg/dL — ABNORMAL HIGH (ref 2.3–4.6)
SODIUM: 135 meq/L — AB (ref 137–147)

## 2014-08-22 MED ORDER — PENTAFLUOROPROP-TETRAFLUOROETH EX AERO
INHALATION_SPRAY | CUTANEOUS | Status: AC
  Filled 2014-08-22: qty 103.5

## 2014-08-22 NOTE — Progress Notes (Signed)
OT Cancellation Note  Patient Details Name: [name] MRN: [id number] DOB: [birth year]   Cancelled Treatment:    Reason Eval/Treat Not Completed: Other (comment)pt. In deep sleep and difficult to arouse for participation.  Provided multiple options for skilled OT tx. Session.  Pt. Declined all options with various reasons.  Offered pt. To choose what he would like to do for skilled OT.  He provided no suggestions and continued to sleep.  When offered to check back later since he was resting he said "well i was going to do something but you want to just let me sleep".  Reviewed all options for skilled OT again as he now appeared interested yet he declined again and fell asleep.  Will attempt to check back as pt. Able, note he may have HD later today.  Janice Coffin, COTA/L 08/22/2014, 9:26 AM

## 2014-08-22 NOTE — Progress Notes (Signed)
[facility] Progress Note   Subjective: no complaints, R foot pain a little better  Filed Vitals:   08/21/14 0744 08/21/14 1332 08/21/14 2150 08/22/14 0451  BP: 142/67 118/47 116/42 133/54  Pulse: 90 76 76 80  Temp:  97.8 F (36.6 C) 98.4 F (36.9 C) 98.3 F (36.8 C)  TempSrc:  Oral Oral Oral  Resp: 16 18 18 18   Height:      Weight:    48.852 kg (107 lb 11.2 oz)  SpO2: 97% 97% 98% 99%   Exam: Calm, alert, thin AAM, no distress No jvd Chest clear bilat RRR 2/6 SEM no RG ABd soft, NTND, +BS R foot mild edema, erythema improving Mild digital gangrene L great toe L thigh AVG +bruit No pretib edema  HD: TTS [facility] 3.5h 46.5 kg 2K/2Ca 400/800 Profile 2 Heparin 4000 U L thigh AVG  Calcitriol 2.75 mcg Aranesp 25 mcg on Thurs Venofer 100 mg through 12/3, then qwk   Assessment: 1. Ischemic R foot, s/p R fem-pop 11/27 - improved on exam, WBC down 2. Ischemic L foot s/p angioplasty x 2 per [facility] (10/28, 11/11) 3. Hypotension - resolved, holding amlod 4. ESRD on HD 5. Volume - wt's inaccurate, no vol excess 6. [facility] - better 7. Anemia ^'d aranesp to 60/wk, cont IV Fe, has rec'd 3u prbc's 8. Sec [facility] pth 713, cont vit D, sensipar, phoslo 9. Hx CAD s/p PCI/ stents    Plan- HD today    [name] MD  pager [phone]    cell [phone]  08/22/2014, 10:18 AM     Recent Labs Lab 08/19/14 0212 08/20/14 0224 08/21/14 0258  NA 135* 138 135*  K 5.0 4.4 4.9  CL 93* 96 93*  CO2 19 26 24   GLUCOSE 59* 104* 100*  BUN 49* 23 43*  CREATININE 9.87* 5.61* 7.86*  CALCIUM 8.8 8.8 8.9  PHOS 5.7* 5.3* 5.6*    Recent Labs Lab 08/16/14 1520 08/19/14 0212 08/20/14 0224 08/21/14 0258  AST 30  --   --   --   ALT 23  --   --   --   ALKPHOS 139*  --   --   --   BILITOT 0.7  --   --   --   PROT 7.0  --   --   --   ALBUMIN 2.6* 2.3* 2.0* 2.0*    Recent Labs Lab 08/20/14 0224 08/21/14 0258 08/22/14 0239  WBC 15.6*  13.5* 12.8*  HGB 9.1* 9.6* 9.7*  HCT 28.4* 29.4* 29.7*  MCV 85.5 85.7 87.1  PLT 176 219 152   . sodium chloride   Intravenous Once  . aspirin EC  81 mg Oral Daily  . atorvastatin  40 mg Oral q1800  . calcitRIOL  2.75 mcg Oral Once  . calcitRIOL  2.75 mcg Oral Q T,Th,Sa-HD  . calcium acetate  2,001 mg Oral TID WC  . carvedilol  6.25 mg Oral BID WC  . cinacalcet  60 mg Oral Daily  . clopidogrel  75 mg Oral Q breakfast  . colchicine  0.3 mg Oral Once per day on Mon Thu  . darbepoetin (ARANESP) injection - DIALYSIS  60 mcg Intravenous Q Sat-HD  . docusate sodium  100 mg Oral BID  . enoxaparin (LOVENOX) injection  30 mg Subcutaneous Q24H  . feeding supplement (NEPRO CARB STEADY)  237 mL Oral BID BM  . ferric gluconate (FERRLECIT/NULECIT) IV  125 mg Intravenous Q T,Th,Sa-HD  . heparin  4,000  Units Dialysis Once in dialysis  . multivitamin  1 tablet Oral QHS  . nitroGLYCERIN  0.3 mg Transdermal Daily  . pantoprazole  40 mg Oral Daily  . predniSONE  5 mg Oral Daily   . sodium chloride Stopped (08/18/14 1430)   sodium chloride, sodium chloride, acetaminophen **OR** acetaminophen, feeding supplement (NEPRO CARB STEADY), guaiFENesin-dextromethorphan, heparin, hydrALAZINE, labetalol, lidocaine (PF), lidocaine-prilocaine, metoprolol, morphine injection, ondansetron, oxyCODONE, pentafluoroprop-tetrafluoroeth, phenol

## 2014-08-22 NOTE — Clinical Social Work Note (Signed)
CSW received referral for SNF.  Case discussed with case manager and plan is to discharge home.  CSW to sign off please re-consult if social work needs arise.  Keishla Oyer R. Alysse Rathe, MSW, LCSWA [phone]  

## 2014-08-22 NOTE — H&P (Signed)
History and Physical  Patient name: [name] MRN: [id number] DOB: 04-11-1961 Sex: male   Reason for Admission: No chief complaint on file.   HISTORY OF PRESENT ILLNESS: He initially presented with bilateral lower extremity ulcers on both great toes. On 01/03/2014, he underwent atherectomy and angioplasty of the left popliteal artery. On 01/11/2014, he underwent atherectomy with drug coded balloon angioplasty of the right popliteal artery as well as angioplasty of the right dorsalis pedis artery. After each procedure the patient developed chest pain. He ultimately underwent coronary stenting. I have been following him for bilateral wound on his feet. 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 He was able to heal areas at the base of his bilateral great toes.   He still has dry gangrene to the left great toe. All other ulcers have healed.  The patient underwent percutaneous intervention on the right leg last week. He was complaining of pain in his right foot.  Ultrasound of the office showed a patent popliteal artery but occluded posterior tibial with blood flow through the anterior tibial artery.  He continued to complain of pain and therefore a repeat Angie Phillip Heal was performed which showed an occluded popliteal artery.  The patient has persistent pain.  Past Medical History  Diagnosis Date  . ESRD on hemodialysis     a. ESRD 2/2 HTN with renal transplant in 1987 (cadaveric) after short period of dialysis;  b. Transplant failed in 2004 and he went back on HD;  c. As of 10/15 getting HD via L thigh AVG on a TTS schedule at [facility] on [facility].  . Hypertension   . Hx of kidney transplant     a. 1987-> back on HD since 2004  . Gout tophi   . Chronic steroid use     a. Has severe gout. Did not tolerate Allopurinol. Do not taper per PCP   . Systolic CHF, chronic     2 D echo 04/2012 with EF of 45 %   . Malnutrition   . Hx SBO  04/2012    a. 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 F/U with Dr Donne Hazel. Started on TNA  . Anemia associated with chronic renal failure   . Hepatitis C   . GERD (gastroesophageal reflux disease)   . H/O hiatal hernia   . Arthritis   . History of DVT (deep vein thrombosis)   . Peripheral vascular disease     a. 12/2013 PTA of L Pop;  b. 12/2013 PTA R pop, R DP;  c. 06/2014 L Pop CBA/DCB PTA.  . Prostate cancer   . CAD (coronary artery disease)     a. 12/2013 Cath/PCI: EF 45-50%, LM Ca2+, LAD 30-40p, 61m, D1/2/3 min irregs, LCX 50ost, 30-87m, RCA 70p, 95/7m (Rota->3.0x23 Xience distal, 3.0x23 Xience mid, 3.0x28 Xience prox).  . Chronic diastolic CHF (congestive heart failure)     a. 12/2013 Echo: EF 55-60%, mild LVH, nl wall motion, Gr 2 DD.  . Dry gangrene     a. L great toe    Past Surgical History  Procedure Laterality Date  . Laparotomy  04/14/2012    Procedure: EXPLORATORY LAPAROTOMY;  Surgeon: Joyice Faster. Cornett, MD;  Location: [place];  Service: General;  Laterality: N/A;  . Colon resection  04/14/2012  . Laparotomy  04/22/2012  Procedure: EXPLORATORY LAPAROTOMY;  Surgeon: Adin Hector, MD;  Location: [place];  Service: General;  Laterality: N/A;  lysis of adhesions, omentoplasty, repair small bowel  . Thrombectomy w/ embolectomy  04/27/2012    Procedure: THROMBECTOMY ARTERIOVENOUS GORE-TEX GRAFT;  Surgeon: Angelia Mould, MD;  Location: [place];  Service: Vascular;  Laterality: Left;  Thrombectomy of left thigh arteriovenous gortex graft  . Prostectomy  2011  . Renal grafts    . Pelvic abcess drainage Right 6/14    removal drain s/p bowl resection 13  . Transanal excision of rectal mass N/A 03/30/2013    Procedure: EXCISION OF anal MASS;  Surgeon: Joyice Faster. Cornett, MD;  Location: [place];  Service: General;  Laterality: N/A;  Exam under anesthesia with excision anal verge mass  . Aortogram  08/15/2014     abd aortogram  . Femoral-popliteal bypass graft Right 08/18/2014    Procedure: BYPASS GRAFT FEMORAL-POPLITEAL ARTERY with Gortex Graft;  Surgeon: Serafina Mitchell, MD;  Location: [place];  Service: Vascular;  Laterality: Right;    History   Social History  . Marital Status: Single    Spouse Name: N/A    Number of Children: N/A  . Years of Education: N/A   Occupational History  . Not on file.   Social History Main Topics  . Smoking status: Former Smoker -- 1.00 packs/day for 10 years    Types: Cigarettes    Quit date: 03/23/1973  . Smokeless tobacco: Never Used     Comment: Quit 30 years ago  . Alcohol Use: No  . Drug Use: No  . Sexual Activity: No   Other Topics Concern  . Not on file   Social History Narrative   Currently in the nursing home until end of the month then will go to live with sister.           Family History  Problem Relation Age of Onset  . Hypertension Father   . Pneumonia Brother   . Lung disease Brother     Allergies as of 08/16/2014 - Review Complete 08/16/2014  Allergen Reaction Noted  . Allopurinol Other (See Comments) 02/27/2010    Current Facility-Administered Medications on File Prior to Encounter  Medication Dose Route Frequency Provider Last Rate Last Dose  . fentaNYL (SUBLIMAZE) injection   Intravenous PRN Carylon Perches, MD   25 mcg at 05/05/12 1544  . midazolam (VERSED) 5 MG/5ML injection   Intravenous PRN Carylon Perches, MD   2 mg at 05/05/12 1544   Current Outpatient Prescriptions on File Prior to Encounter  Medication Sig Dispense Refill  . amLODipine (NORVASC) 5 MG tablet Take 5 mg by mouth daily.   11  . aspirin EC 81 MG tablet Take 81 mg by mouth daily.    Marland Kitchen atorvastatin (LIPITOR) 40 MG tablet TAKE 1 TABLET BY MOUTH DAILY AT 6 PM 30 tablet 9  . calcium acetate (PHOSLO) 667 MG tablet Take 2,001 mg by mouth 3 (three) times daily.     . carvedilol (COREG) 6.25 MG tablet TAKE 1 TABLET BY MOUTH TWICE DAILY WITH MEALS 60 tablet 9    . cinacalcet (SENSIPAR) 60 MG tablet Take 60 mg by mouth daily.    . clopidogrel (PLAVIX) 75 MG tablet Take 1 tablet (75 mg total) by mouth daily with breakfast. 30 tablet 10  . COLCRYS 0.6 MG tablet Take 0.6 mg by mouth daily.     Marland Kitchen HYDROmorphone (DILAUDID) 2 MG tablet Take 1 to  2 tablets by mouth every 6 hours as needed for postoperative pain. (Patient taking differently: Take 1-2 mg by mouth every 6 (six) hours as needed (pain). ) 40 tablet 0  . LIDOCAINE EX Apply 1 application topically See admin instructions. Apply to inject site on dialysis days (Tues, Thur, Sat) if needed for pain    . loperamide (IMODIUM A-D) 2 MG tablet Take 2 mg by mouth daily as needed for diarrhea or loose stools.     . nitroGLYCERIN (NITRODUR - DOSED IN MG/24 HR) 0.3 mg/hr patch Place 1 patch (0.3 mg total) onto the skin daily. 30 patch 12  . oxyCODONE (ROXICODONE) 5 MG immediate release tablet Take 1 tablet (5 mg total) by mouth every 4 (four) hours as needed for severe pain. 30 tablet 0  . pantoprazole (PROTONIX) 40 MG tablet Take 1 tablet (40 mg total) by mouth daily. 30 tablet 6  . predniSONE (DELTASONE) 5 MG tablet Take 5 mg by mouth daily.    . multivitamin (RENA-VIT) TABS tablet Take 1 tablet by mouth daily. 30 tablet 0     REVIEW OF SYSTEMS: Please see history of present illness, otherwise negative  PHYSICAL EXAMINATION: General: The patient appears their stated age.  Vital signs are BP 133/54 mmHg  Pulse 80  Temp(Src) 98.3 F (36.8 C) (Oral)  Resp 18  Ht 5\' 11"  (1.803 m)  Wt 107 lb 11.2 oz (48.852 kg)  BMI 15.03 kg/m2  SpO2 99% HEENT:  No gross abnormalities Pulmonary: Respirations are non-labored Abdomen: Soft and non-tender with. Musculoskeletal: There are no major deformities.   Neurologic: Normal motor and sensory function in the right foot Skin: There are no ulcer or rashes noted. Psychiatric: The patient has normal affect. Cardiovascular: Left thigh AV graft.  Diagnostic  Studies: None   Medication Changes: heparin  Assessment:  Rest pain, right foot Plan: The patient will be admitted for IV pain control as well as IV heparinization to see if this helps his symptoms.  Plan will be for surgical revascularization     V. Leia Alf, M.D. [facility] [place] Office: [phone] Pager:  [phone]

## 2014-08-22 NOTE — Progress Notes (Signed)
  Progress Note    08/22/2014 7:44 AM 4 Days Post-Op  Subjective:  Pt's biggest complaint is about the pain with the Lovenox shot; c/o pill getting stuck in his throat with burning  Afebrile x 24hrs 094'B-096'G systolic (836'O this am) HR 70's-80's NSR 99% RA  Filed Vitals:   08/22/14 0451  BP: 133/54  Pulse: 80  Temp: 98.3 F (36.8 C)  Resp: 18    Physical Exam: Lungs:  Non labored Incisions:  Right groin and below knee incisions are healing nicely Extremities:  Right foot is warm; right great toe sore is unchanged   CBC    Component Value Date/Time   WBC 12.8* 08/22/2014 0239   RBC 3.41* 08/22/2014 0239   HGB 9.7* 08/22/2014 0239   HCT 29.7* 08/22/2014 0239   PLT 152 08/22/2014 0239   MCV 87.1 08/22/2014 0239   MCH 28.4 08/22/2014 0239   MCHC 32.7 08/22/2014 0239   RDW 15.6* 08/22/2014 0239   LYMPHSABS 1.1 03/23/2013 1136   MONOABS 0.8 03/23/2013 1136   EOSABS 0.1 03/23/2013 1136   BASOSABS 0.0 03/23/2013 1136    BMET    Component Value Date/Time   NA 135* 08/21/2014 0258   K 4.9 08/21/2014 0258   CL 93* 08/21/2014 0258   CO2 24 08/21/2014 0258   GLUCOSE 100* 08/21/2014 0258   BUN 43* 08/21/2014 0258   CREATININE 7.86* 08/21/2014 0258   CREATININE 7.80* 01/16/2014 1437   CALCIUM 8.9 08/21/2014 0258   GFRNONAA 7* 08/21/2014 0258   GFRAA 8* 08/21/2014 0258    INR    Component Value Date/Time   INR 1.18 08/16/2014 1520     Intake/Output Summary (Last 24 hours) at 08/22/14 0744 Last data filed at 08/21/14 1230  Gross per 24 hour  Intake    150 ml  Output      0 ml  Net    150 ml   ABI's 08/21/14:  RIGHT    LEFT    PRESSURE WAVEFORM  PRESSURE WAVEFORM  BRACHIAL 155 triphasic BRACHIAL  triphasic  DP   DP    AT Non compressible Dampened monophasic AT Non compressible monophasic  PT Non compressible Dampened monophasic PT Non compressible monophasic  PER   PER    GREAT TOE  NA GREAT TOE  NA     RIGHT LEFT  ABI N/A due to calcified vessels N/A due to calcified vessels     Assessment:  53 y.o. male is s/p:  Right femoral to below knee popliteal artery bypass graft with 6 mm external ring propatent PTFE   4 Days Post-Op  Plan: -pt doing well from surgical standpoint -DVT prophylaxis:  Lovenox -renal failure-for HD today -acute surgical blood loss anemia-pt's hgb is stable -leukocytosis continues to improve -continue to observe right great toe wound-unchanged the past couple of days -if pt continues to have issues with swallowing pills-may need swallow eval  -continue PT-pt continues to work right leg.  PT recommending SNF, but pt refusing so therefore, will need HHPT -case management ordered and face to face completed.   Leontine Locket, PA-C [facility] [phone] 08/22/2014 7:44 AM    Excellent Doppler signals and bypass graft, however the patient still has intermittent pain in his right foot.  He may be a candidate for rehabilitation, depending on how he does with physical therapy.  Incisions are healing nicely  [name]

## 2014-08-22 NOTE — Plan of Care (Signed)
Problem: Phase I Progression Outcomes Goal: Tolerates diet without nausea/vomiting Outcome: Completed/Met Date Met:  08/22/14 Goal: Pain controlled with appropriate interventions Outcome: Completed/Met Date Met:  08/22/14

## 2014-08-22 NOTE — Plan of Care (Signed)
Problem: Phase II Progression Outcomes Goal: Pain controlled Outcome: Completed/Met Date Met:  08/22/14     

## 2014-08-22 NOTE — Plan of Care (Signed)
Problem: Phase I Progression Outcomes Goal: Vascular site scale level 0 - I Vascular Site Scale Level 0: No bruising/bleeding/hematoma Level I (Mild): Bruising/Ecchymosis, minimal bleeding/ooozing, palpable hematoma < 3 cm Level II (Moderate): Bleeding not affecting hemodynamic parameters, pseudoaneurysm, palpable hematoma > 3 cm Level III (Severe) Bleeding which affects hemodynamic parameters or retroperitoneal hemorrhage  Outcome: Completed/Met Date Met:  08/22/14  Problem: Phase II Progression Outcomes Goal: Vascular site scale level 0 - I Vascular Site Scale Level 0: No bruising/bleeding/hematoma Level I (Mild): Bruising/Ecchymosis, minimal bleeding/ooozing, palpable hematoma < 3 cm Level II (Moderate): Bleeding not affecting hemodynamic parameters, pseudoaneurysm, palpable hematoma > 3 cm Level III (Severe) Bleeding which affects hemodynamic parameters or retroperitoneal hemorrhage  Outcome: Completed/Met Date Met:  08/22/14

## 2014-08-22 NOTE — Progress Notes (Signed)
   Subjective: Breathing is OK  No CP   Objective: Filed Vitals:   08/21/14 0744 08/21/14 1332 08/21/14 2150 08/22/14 0451  BP: 142/67 118/47 116/42 133/54  Pulse: 90 76 76 80  Temp:  97.8 F (36.6 C) 98.4 F (36.9 C) 98.3 F (36.8 C)  TempSrc:  Oral Oral Oral  Resp: 16 18 18 18   Height:      Weight:    107 lb 11.2 oz (48.852 kg)  SpO2: 97% 97% 98% 99%   Weight change: -1 lb 13.9 oz (-0.848 kg)  Intake/Output Summary (Last 24 hours) at 08/22/14 0959 Last data filed at 08/22/14 0730  Gross per 24 hour  Intake    390 ml  Output      0 ml  Net    390 ml    General: Alert, awake, oriented x3, in no acute distress Neck:  JVP is normal Heart: Regular rate and rhythm, without murmurs, rubs, gallops.  Lungs: Clear to auscultation.  No rales or wheezes. Exemities:  No edema.   Neuro: Grossly intact, nonfocal.   Lab Results: Results for orders placed or performed during the hospital encounter of 08/16/14 (from the past 24 hour(s))  CBC     Status: Abnormal   Collection Time: 08/22/14  2:39 AM  Result Value Ref Range   WBC 12.8 (H) 4.0 - 10.5 K/uL   RBC 3.41 (L) 4.22 - 5.81 MIL/uL   Hemoglobin 9.7 (L) 13.0 - 17.0 g/dL   HCT 29.7 (L) 39.0 - 52.0 %   MCV 87.1 78.0 - 100.0 fL   MCH 28.4 26.0 - 34.0 pg   MCHC 32.7 30.0 - 36.0 g/dL   RDW 15.6 (H) 11.5 - 15.5 %   Platelets 152 150 - 400 K/uL    Studies/Results: No results found.  Medications:  Reviewed     @PROBHOSP @  1  CAD No evid for active ischemia  Keep on current regimen  Back on plavix.    2.  HTN  BP is well controlled  No new recommendations.  Will sign off for now  Call with questions  LOS: 6 days   Dorris Carnes 08/22/2014, 9:59 AM

## 2014-08-23 DIAGNOSIS — M79609 Pain in unspecified limb: Secondary | ICD-10-CM

## 2014-08-23 LAB — CBC
HCT: 29.9 % — ABNORMAL LOW (ref 39.0–52.0)
Hemoglobin: 9.5 g/dL — ABNORMAL LOW (ref 13.0–17.0)
MCH: 27.4 pg (ref 26.0–34.0)
MCHC: 31.8 g/dL (ref 30.0–36.0)
MCV: 86.2 fL (ref 78.0–100.0)
PLATELETS: 163 10*3/uL (ref 150–400)
RBC: 3.47 MIL/uL — ABNORMAL LOW (ref 4.22–5.81)
RDW: 15.8 % — ABNORMAL HIGH (ref 11.5–15.5)
WBC: 10 10*3/uL (ref 4.0–10.5)

## 2014-08-23 MED ORDER — BISACODYL 10 MG RE SUPP
10.0000 mg | Freq: Every day | RECTAL | Status: DC | PRN
  Administered 2014-08-23 – 2014-08-25 (×2): 10 mg via RECTAL
  Filled 2014-08-23 (×2): qty 1

## 2014-08-23 MED ORDER — SENNOSIDES-DOCUSATE SODIUM 8.6-50 MG PO TABS
1.0000 | ORAL_TABLET | Freq: Two times a day (BID) | ORAL | Status: DC | PRN
  Administered 2014-08-23 – 2014-08-31 (×2): 1 via ORAL
  Filled 2014-08-23 (×3): qty 1

## 2014-08-23 NOTE — Progress Notes (Signed)
PT Cancellation Note  Patient Details Name: [name] MRN: [id number] DOB: 03-11-1961   Cancelled Treatment:    Reason Eval/Treat Not Completed: Other (comment) (pt constipated, has received 2 enemas), encouraged pt that mobility could help but he declined due to abdominal pain and discouraged by situation. Will check back tomorrow.   [place], [place] 08/23/2014, 2:10 PM

## 2014-08-23 NOTE — Plan of Care (Signed)
Problem: Phase I Progression Outcomes Goal: Graft patent without s/s of occlusion Outcome: Completed/Met Date Met:  08/23/14 Pedal pulses in right foot/leg 2+ doppler pulses. Left foot/leg much weaker than right. PA aware and notes patient will possible have another surgical intervention later this week  Problem: Phase II Progression Outcomes Goal: Graft patent without s/s of occlusion Outcome: Completed/Met Date Met:  08/23/14 Goal: Tolerating diet Outcome: Progressing Patient c/o poor appetite due to not having a good BM. PA made aware this morning and medications ordered. RN will continue to monitor.  Problem: Phase III Progression Outcomes Goal: Graft patent without s/s of occlusion Outcome: Completed/Met Date Met:  08/23/14

## 2014-08-23 NOTE — Progress Notes (Signed)
Medicare Important Message given? YES  (If response is "NO", the following Medicare IM given date fields will be blank)  Date Medicare IM given:  08/23/14 Medicare IM given by:  Dahlia Client [employer]

## 2014-08-23 NOTE — Progress Notes (Signed)
[facility] [place]  Subjective  - He is constipated.   Objective 121/44 83 99.3 F (37.4 C) (Oral) 18 97%  Intake/Output Summary (Last 24 hours) at 08/23/14 0748 Last data filed at 08/23/14 5284  Gross per 24 hour  Intake    120 ml  Output   2000 ml  Net  -1880 ml    Left foot edema Bilateral DP doppler R>L Abd +BS   Assessment/Planning: PAD with dry gangrene Left LE S/P right by-pass femoral to below knee popliteal artery bypass graft with 6 mm external ring propatent PTFE  Will discuss surgical plan with Dr. Trula Slade. Lovenox for DVT prophylaxis   Laurence Slate [facility] 08/23/2014 7:48 AM --  Laboratory Lab Results:  Recent Labs  08/22/14 0239 08/23/14 0509  WBC 12.8* 10.0  HGB 9.7* 9.5*  HCT 29.7* 29.9*  PLT 152 163   BMET  Recent Labs  08/21/14 0258 08/22/14 1145  NA 135* 135*  K 4.9 4.8  CL 93* 91*  CO2 24 24  GLUCOSE 100* 115*  BUN 43* 68*  CREATININE 7.86* 10.49*  CALCIUM 8.9 8.5    COAG Lab Results  Component Value Date   INR 1.18 08/16/2014   INR 1.11 07/20/2014   INR 0.96 01/12/2014   No results found for: PTT

## 2014-08-23 NOTE — Progress Notes (Signed)
[facility] Progress Note   Subjective: no complaints  Filed Vitals:   08/22/14 1617 08/22/14 1627 08/22/14 2005 08/23/14 0514  BP: 98/50 129/43 126/63 121/44  Pulse:  80 83 83  Temp:  97.9 F (36.6 C) 97.9 F (36.6 C) 99.3 F (37.4 C)  TempSrc:  Oral Oral Oral  Resp:  15 18 18   Height:      Weight:  46.5 kg (102 lb 8.2 oz)    SpO2:  99% 98% 97%   Exam: Calm, alert, thin AAM, no distress No jvd Chest clear bilat RRR 2/6 SEM no RG ABd soft, NTND, +BS R foot edema resolved, erythema improved, great toe darkening no chg Mild digital gangrene L great toe, left foot edema 1+ L thigh AVG +bruit No pretib edema  HD: TTS [facility] 3.5h 46.5 kg 2K/2Ca 400/800 Profile 2 Heparin 4000 U L thigh AVG  Calcitriol 2.75 mcg Aranesp 25 mcg on Thurs Venofer 100 mg through 12/3, then qwk   Assessment: 1. Ischemic R foot, s/p R fem-pop 11/27 - improved on exam, WBC down 2. Ischemic L foot s/p angioplasty x 2 per [facility] (10/28, 11/11) 3. ESRD on HD 4. HTN/volume - at dry weight, BP's soft holding amlod, on lowdose coreg bid 5. Anemia ^'d aranesp to 60/wk, cont IV Fe, has rec'd 3u prbc's 6. Sec HPTH pth 713, cont vit D, sensipar, phoslo 7. Hx CAD s/p PCI/ stents 8. Constipation - ordering tap water enema, did not respond to dulcolax supp    Plan- HD tomorrow, lower dry wt a bit if possible    [name] MD  pager [phone]    cell [phone]  08/23/2014, 1:05 PM     Recent Labs Lab 08/20/14 0224 08/21/14 0258 08/22/14 1145  NA 138 135* 135*  K 4.4 4.9 4.8  CL 96 93* 91*  CO2 26 24 24   GLUCOSE 104* 100* 115*  BUN 23 43* 68*  CREATININE 5.61* 7.86* 10.49*  CALCIUM 8.8 8.9 8.5  PHOS 5.3* 5.6* 5.3*    Recent Labs Lab 08/16/14 1520  08/20/14 0224 08/21/14 0258 08/22/14 1145  AST 30  --   --   --   --   ALT 23  --   --   --   --   ALKPHOS 139*  --   --   --   --   BILITOT 0.7  --   --   --   --   PROT 7.0  --    --   --   --   ALBUMIN 2.6*  < > 2.0* 2.0* 2.0*  < > = values in this interval not displayed.  Recent Labs Lab 08/21/14 0258 08/22/14 0239 08/23/14 0509  WBC 13.5* 12.8* 10.0  HGB 9.6* 9.7* 9.5*  HCT 29.4* 29.7* 29.9*  MCV 85.7 87.1 86.2  PLT 219 152 163   . sodium chloride   Intravenous Once  . aspirin EC  81 mg Oral Daily  . atorvastatin  40 mg Oral q1800  . calcitRIOL  2.75 mcg Oral Once  . calcitRIOL  2.75 mcg Oral Q T,Th,Sa-HD  . calcium acetate  2,001 mg Oral TID WC  . carvedilol  6.25 mg Oral BID WC  . cinacalcet  60 mg Oral Daily  . clopidogrel  75 mg Oral Q breakfast  . colchicine  0.3 mg Oral Once per day on Mon Thu  . darbepoetin (ARANESP) injection - DIALYSIS  60 mcg Intravenous Q Sat-HD  . docusate  sodium  100 mg Oral BID  . enoxaparin (LOVENOX) injection  30 mg Subcutaneous Q24H  . feeding supplement (NEPRO CARB STEADY)  237 mL Oral BID BM  . ferric gluconate (FERRLECIT/NULECIT) IV  125 mg Intravenous Q T,Th,Sa-HD  . multivitamin  1 tablet Oral QHS  . nitroGLYCERIN  0.3 mg Transdermal Daily  . pantoprazole  40 mg Oral Daily  . predniSONE  5 mg Oral Daily   . sodium chloride Stopped (08/18/14 1430)   acetaminophen **OR** acetaminophen, bisacodyl, guaiFENesin-dextromethorphan, hydrALAZINE, labetalol, metoprolol, morphine injection, ondansetron, oxyCODONE, phenol, senna-docusate

## 2014-08-23 NOTE — Care Management Note (Signed)
    Page 1 of 1   08/23/2014     11:20:42 AM CARE MANAGEMENT NOTE 08/23/2014  Patient:  [name], [name]   Account Number:  [id number]  Date Initiated:  08/23/2014  Documentation initiated by:  Marvetta Gibbons  Subjective/Objective Assessment:   Pt admitted with R leg ischemia, S/P right by-pass femoral to below knee popliteal artery bypass graft     Action/Plan:   PTA pt lived at home- PT/OT evals- recommmendation for SNF- pt refusing and wants to return home- agreeable to [facility], has RW at home   Anticipated DC Date:  08/24/2014   Anticipated DC Plan:  [place]  CM consult      [place]   Choice offered to / List presented to:  C-1 Patient        [place] arranged  [place] PT      [place].   Status of service:  In process, will continue to follow Medicare Important Message given?  YES (If response is "NO", the following Medicare IM given date fields will be blank) Date Medicare IM given:  08/23/2014 Medicare IM given by:  Marvetta Gibbons Date Additional Medicare IM given:   Additional Medicare IM given by:    Discharge Disposition:    Per UR Regulation:  Reviewed for med. necessity/level of care/duration of stay  If discussed at [place] of Stay Meetings, dates discussed:    Comments:  08/23/14- 1100- Marvetta Gibbons RN, BSN [phone] Referral for [facility] needs- order for HH-PT- spoke with pt at bedside- pt states that he has had [facility] in past- does not want to go to SNF for rehab agreeable to having [facility] again- has used [facility] before and wants to use them again- referral called to Amy with [facility]- for HH-PT- services will begin within 24-48 hr post discharge.

## 2014-08-23 NOTE — Progress Notes (Signed)
Left lower extremity venous duplex completed.  Left:  No evidence of DVT, superficial thrombosis, or Baker's cyst.  Technically difficult study due to bandages left thigh and graft.  Graft appears to be patent.  Right:  Negative for DVT in the common femoral vein.

## 2014-08-23 NOTE — Plan of Care (Signed)
Problem: Phase II Progression Outcomes Goal: Tolerating room air with O2 sats > or equal 90% Outcome: Completed/Met Date Met:  08/23/14

## 2014-08-24 ENCOUNTER — Encounter (HOSPITAL_COMMUNITY)
Admission: AD | Disposition: A | Discharge status: Home Health | Payer: Self-pay | Source: Ambulatory Visit | Attending: Surgery

## 2014-08-24 DIAGNOSIS — I82412 Acute embolism and thrombosis of left femoral vein: Secondary | ICD-10-CM

## 2014-08-24 DIAGNOSIS — T82898A Other specified complication of vascular prosthetic devices, implants and grafts, initial encounter: Secondary | ICD-10-CM

## 2014-08-24 HISTORY — PX: SHUNTOGRAM: SHX5491

## 2014-08-24 LAB — CBC
HCT: 29.3 % — ABNORMAL LOW (ref 39.0–52.0)
HCT: 26.9 % — ABNORMAL LOW (ref 39.0–52.0)
Hemoglobin: 9.2 g/dL — ABNORMAL LOW (ref 13.0–17.0)
Hemoglobin: 8.7 g/dL — ABNORMAL LOW (ref 13.0–17.0)
MCH: 27.2 pg (ref 26.0–34.0)
MCH: 27.7 pg (ref 26.0–34.0)
MCHC: 31.4 g/dL (ref 30.0–36.0)
MCHC: 32.3 g/dL (ref 30.0–36.0)
MCV: 86.7 fL (ref 78.0–100.0)
MCV: 85.7 fL (ref 78.0–100.0)
PLATELETS: 152 10*3/uL (ref 150–400)
PLATELETS: 146 10*3/uL — AB (ref 150–400)
RBC: 3.38 MIL/uL — ABNORMAL LOW (ref 4.22–5.81)
RBC: 3.14 MIL/uL — ABNORMAL LOW (ref 4.22–5.81)
RDW: 15.7 % — AB (ref 11.5–15.5)
RDW: 15.8 % — AB (ref 11.5–15.5)
WBC: 11.6 10*3/uL — ABNORMAL HIGH (ref 4.0–10.5)
WBC: 8.4 10*3/uL (ref 4.0–10.5)

## 2014-08-24 LAB — CREATININE, SERUM
CREATININE: 9.22 mg/dL — AB (ref 0.50–1.35)
GFR calc Af Amer: 7 mL/min — ABNORMAL LOW (ref >90)
GFR calc non Af Amer: 6 mL/min — ABNORMAL LOW (ref >90)

## 2014-08-24 LAB — RENAL FUNCTION PANEL
ALBUMIN: 1.9 g/dL — AB (ref 3.5–5.2)
Anion gap: 24 — ABNORMAL HIGH (ref 5–15)
BUN: 54 mg/dL — ABNORMAL HIGH (ref 6–23)
CO2: 19 mEq/L (ref 19–32)
CREATININE: 9.22 mg/dL — AB (ref 0.50–1.35)
Calcium: 8.6 mg/dL (ref 8.4–10.5)
Chloride: 93 mEq/L — ABNORMAL LOW (ref 96–112)
GFR calc Af Amer: 7 mL/min — ABNORMAL LOW (ref >90)
GFR calc non Af Amer: 6 mL/min — ABNORMAL LOW (ref >90)
Glucose, Bld: 63 mg/dL — ABNORMAL LOW (ref 70–99)
Phosphorus: 5.4 mg/dL — ABNORMAL HIGH (ref 2.3–4.6)
Potassium: 5 mEq/L (ref 3.7–5.3)
Sodium: 136 mEq/L — ABNORMAL LOW (ref 137–147)

## 2014-08-24 SURGERY — ASSESSMENT, SHUNT FUNCTION, WITH CONTRAST RADIOGRAPHIC STUDY
Anesthesia: LOCAL

## 2014-08-24 MED ORDER — HEPARIN SODIUM (PORCINE) 5000 UNIT/ML IJ SOLN: 5000.0000 [IU] | Freq: Three times a day (TID) | INTRAMUSCULAR | Status: DC

## 2014-08-24 MED ORDER — SODIUM CHLORIDE 0.9 % IV SOLN: 100.0000 mL | INTRAVENOUS | Status: DC | PRN

## 2014-08-24 MED ORDER — PENTAFLUOROPROP-TETRAFLUOROETH EX AERO: 1.0000 "application " | INHALATION_SPRAY | CUTANEOUS | Status: DC | PRN

## 2014-08-24 MED ORDER — LIDOCAINE HCL (PF) 1 % IJ SOLN
INTRAMUSCULAR | Status: AC
  Filled 2014-08-24: qty 30

## 2014-08-24 MED ORDER — FENTANYL CITRATE 0.05 MG/ML IJ SOLN
INTRAMUSCULAR | Status: AC
  Filled 2014-08-24: qty 2

## 2014-08-24 MED ORDER — NEPRO/CARBSTEADY PO LIQD
237.0000 mL | ORAL | Status: DC | PRN
  Filled 2014-08-24: qty 237

## 2014-08-24 MED ORDER — SODIUM CHLORIDE 0.9 % IJ SOLN: 3.0000 mL | INTRAMUSCULAR | Status: DC | PRN

## 2014-08-24 MED ORDER — ALTEPLASE 2 MG IJ SOLR
2.0000 mg | Freq: Once | INTRAMUSCULAR | Status: AC | PRN
  Filled 2014-08-24: qty 2

## 2014-08-24 MED ORDER — HEPARIN SODIUM (PORCINE) 1000 UNIT/ML DIALYSIS
1000.0000 [IU] | INTRAMUSCULAR | Status: DC | PRN
  Filled 2014-08-24: qty 1

## 2014-08-24 MED ORDER — MIDAZOLAM HCL 2 MG/2ML IJ SOLN
INTRAMUSCULAR | Status: AC
  Filled 2014-08-24: qty 2

## 2014-08-24 MED ORDER — LIDOCAINE-PRILOCAINE 2.5-2.5 % EX CREA
1.0000 "application " | TOPICAL_CREAM | CUTANEOUS | Status: DC | PRN
  Filled 2014-08-24: qty 5

## 2014-08-24 MED ORDER — SODIUM CHLORIDE 0.9 % IJ SOLN
3.0000 mL | Freq: Two times a day (BID) | INTRAMUSCULAR | Status: DC
  Administered 2014-08-25 – 2014-09-01 (×12): 3 mL via INTRAVENOUS

## 2014-08-24 MED ORDER — HEPARIN SODIUM (PORCINE) 1000 UNIT/ML DIALYSIS
2000.0000 [IU] | INTRAMUSCULAR | Status: DC | PRN
  Filled 2014-08-24: qty 2

## 2014-08-24 MED ORDER — MORPHINE SULFATE 2 MG/ML IJ SOLN
INTRAMUSCULAR | Status: AC
  Administered 2014-08-26: 2 mg via INTRAVENOUS
  Filled 2014-08-24: qty 1

## 2014-08-24 MED ORDER — SODIUM CHLORIDE 0.9 % IV SOLN: 250.0000 mL | INTRAVENOUS | Status: DC | PRN

## 2014-08-24 MED ORDER — HEPARIN SODIUM (PORCINE) 1000 UNIT/ML IJ SOLN
INTRAMUSCULAR | Status: AC
  Filled 2014-08-24: qty 1

## 2014-08-24 MED ORDER — LIDOCAINE HCL (PF) 1 % IJ SOLN: 5.0000 mL | INTRAMUSCULAR | Status: DC | PRN

## 2014-08-24 NOTE — Interval H&P Note (Signed)
[facility] [place]  History and Physical Update  The patient was interviewed and re-examined.  The patient's previous History and Physical has been reviewed and is unchanged from Dr. Trula Slade notes except for: interval left leg swelling consistent with possible outflow stenosis from his left thigh AVG.  There is no change in the plan of care: L thigh shuntogram, possible intervention.  Adele Barthel, MD [facility] [place] Office: [phone] Pager: [phone]  08/24/2014, 3:24 PM

## 2014-08-24 NOTE — Procedures (Signed)
I was present at this session.  I have reviewed the session itself and made appropriate changes.  Bp ok . Access press ok. Angioplasty/stent today.  Discussed his illness now.  Wende Longstreth L 12/3/20159:11 PM

## 2014-08-24 NOTE — Progress Notes (Signed)
Occupational Therapy Treatment Patient Details Name: [name] MRN: [id number] DOB: 21-Feb-1961 Today's Date: 08/24/2014    History of present illness [name] is a 53 y.o. AA male with a history of peripheral vascular disease, CAD s/p PCI in 12/2013,  CHF, Hepatitis C, SBO and ESRD. He had angioplasty of the right popliteal artery per Dr. Trula Slade on 11/11, but returned to the ED 11/22 with worsening right foot pain and was found to have significant plaque with reduced flows throughout the RLE. 11/25 Pt was unable to stand to attend dialysis secondary to severe right foot pain and was admitted for management.  S/p R fem-pop BPG 11/27   OT comments  Pt demonstrates improving independence with BADLs - currently, he requires min A overall.   He moves very cautiously and requires significant amount of encouragement to participate due to pain and fatigue, but states "I did more than I thought I could do" at the end of the session.   He appears apathetic at times - question if there is a component of depression.  He reports he will have near 24 hour assist at home from sister and nephew, and wishes to return home instead of SNF.   Changed recommendation to [place]   Follow Up Recommendations  Home health OT;Supervision/Assistance - 24 hour    Equipment Recommendations  3 in 1 bedside comode    Recommendations for Other Services      Precautions / Restrictions Precautions Precautions: Fall Restrictions Weight Bearing Restrictions: No       Mobility Bed Mobility Overal bed mobility: Modified Independent             General bed mobility comments: Pt with increased time and use of bed rails to move to seated position but able to do so without physical assist or cues  Transfers Overall transfer level: Needs assistance Equipment used: Rolling walker (2 wheeled) Transfers: Sit to/from [facility] Sit to Stand: Min guard Stand pivot transfers: Min guard        General transfer comment: min guard assist due to pain, and difficulty fully weight bearing through Rt. LE     Balance Overall balance assessment: Needs assistance Sitting-balance support: Feet supported;No upper extremity supported Sitting balance-Leahy Scale: Good     Standing balance support: During functional activity Standing balance-Leahy Scale: Poor                     ADL Overall ADL's : Needs assistance/impaired     Grooming: Wash/dry hands;Wash/dry face;Min guard;Standing               Lower Body Dressing: Minimal assistance;Sitting/lateral leans   Toilet Transfer: Min guard;RW;Ambulation   Toileting- Clothing Manipulation and Hygiene: Moderate assistance;Sit to/from stand       Functional mobility during ADLs: Min guard;Rolling walker General ADL Comments: Pt initially refusing to work with OT, but with max ecnouragement he was agreeable and states "I did more than I thought I could".  He moves slowly, and is limited by pain      Vision                     Perception     Praxis      Cognition   Behavior During Therapy: Flat affect Overall Cognitive Status: Within Functional Limits for tasks assessed Area of Impairment: Attention;Problem solving   Current Attention Level: Sustained          Problem Solving: Slow  processing General Comments: Pt with no obvious cognitive deficits, but does appear apathetic at times     Extremity/Trunk Assessment               Exercises General Exercises - Lower Extremity Ankle Circles/Pumps: AROM;Seated;Both;15 reps Long Arc Quad: AROM;Seated;10 reps;Right   Shoulder Instructions       General Comments      Pertinent Vitals/ Pain       Pain Assessment: 0-10 Pain Score: 5  Pain Location: Rt. LE Pain Descriptors / Indicators: Aching;Throbbing Pain Intervention(s): Monitored during session;Limited activity within patient's tolerance;Repositioned  Home Living                                           Prior Functioning/Environment              Frequency Min 2X/week     Progress Toward Goals  OT Goals(current goals can now be found in the care plan section)  Progress towards OT goals: Progressing toward goals  Acute Rehab OT Goals Potential to Achieve Goals: Good ADL Goals Pt Will Perform Lower Body Dressing: sit to/from stand;sitting/lateral leans;with set-up Pt Will Transfer to Toilet: with supervision;ambulating;bedside commode Pt Will Perform Toileting - Clothing Manipulation and hygiene: with set-up;sitting/lateral leans;sit to/from stand  Plan Discharge plan needs to be updated    Co-evaluation                 End of Session Equipment Utilized During Treatment: Rolling walker   Activity Tolerance Patient limited by pain   Patient Left in bed;with call bell/phone within reach   Nurse Communication Mobility status        Time: 1131-1156 OT Time Calculation (min): 25 min  Charges: OT General Charges $OT Visit: 1 Procedure OT Treatments $Therapeutic Activity: 23-37 mins  Olamide Carattini M 08/24/2014, 12:19 PM

## 2014-08-24 NOTE — Progress Notes (Signed)
INITIAL NUTRITION ASSESSMENT  DOCUMENTATION CODES Per approved criteria  -Severe malnutrition in the context of chronic illness -Underweight   INTERVENTION: Advance diet as medically appropriate, add interventions accordingly RD to follow for nutrition care plan  NUTRITION DIAGNOSIS: Increased nutrient needs related to malnutrition, ESRD on HD as evidenced by estimated nutrition needs  Goal: Pt to meet >/= 90% of their estimated nutrition needs   Monitor:  PO & supplemental intake, weight, labs, I/O's  Reason for Assessment: Malnutrition Screening Tool Report  53 y.o. male  Admitting Dx: severe R foot pain  ASSESSMENT: 53 y.o. Male with a history of PVD, CAD s/p PCI in 12/2013, CHF, Hepatitis C, SBO and ESRD; had angioplasty of the right popliteal artery per Dr. Trula Slade on 11/11, but returned to the ED 11/22 with worsening right foot pain and was found to have significant plaque with reduced flows throughout the RLE; pt unable to stand to attend dialysis secondary to severe right foot pain and was admitted for management.   Patient s/p procedure 11/27: RIGHT FEMORAL TO BELOW KNEE POPLITEAL ARTERY BYPASS GRAFT  Pt seen per Clinical Nutrition during previous hospital admissions.  Dx with malnutrition which is ongoing.  Pt reports a poor appetite.  PO intake variable at 0-100% per flowsheet records.  Does not like Nepro or Ensure supplements.  Willing to try [employer].  Currently NPO for L thigh shuntogram.  RD to add when/as able.  Nutrition Focused Physical Exam:  Subcutaneous Fat:  Orbital Region: moderate wasting Upper Arm Region: moderate-severe wasting Thoracic and Lumbar Region: severe wasting  Muscle:  Temple Region: severe wasting Clavicle Bone Region: moderate-severe wasting Clavicle and Acromion Bone Region: severe wasting Scapular Bone Region: moderate wasting Dorsal Hand: moderate wasting Patellar Region: severe wasting Anterior Thigh Region: severe  wasting Posterior Calf Region: severe wasting  Edema: none   Patient continues to meet criteria for severe malnutrition in the context of chronic illness as evidenced by severe muscle loss and severe subcutaneous fat loss.  Height: Ht Readings from Last 1 Encounters:  08/18/14 5\' 11"  (1.803 m)    Weight: Wt Readings from Last 1 Encounters:  08/22/14 102 lb 8.2 oz (46.5 kg)    Ideal Body Weight: 172 lb  % Ideal Body Weight: 59%  Wt Readings from Last 15 Encounters:  08/22/14 102 lb 8.2 oz (46.5 kg)  08/15/14 110 lb (49.896 kg)  08/07/14 108 lb (48.988 kg)  08/02/14 110 lb (49.896 kg)  07/20/14 103 lb 9.9 oz (47 kg)  07/10/14 108 lb 8 oz (49.215 kg)  06/07/14 106 lb 1.6 oz (48.127 kg)  05/08/14 104 lb 9.6 oz (47.446 kg)  03/13/14 105 lb 11.2 oz (47.945 kg)  03/06/14 105 lb 3.2 oz (47.718 kg)  02/22/14 104 lb (47.174 kg)  01/30/14 103 lb (46.72 kg)  01/20/14 102 lb (46.267 kg)  01/14/14 105 lb 2.6 oz (47.7 kg)  01/05/14 98 lb 15.8 oz (44.9 kg)    Usual Body Weight (EDW): 102 lb  % Usual Body Weight: 100%  BMI:  Body mass index is 14.3 kg/(m^2).  Estimated Nutritional Needs: Kcal: 1600-1800 Protein: 75-85 gm Fluid: 1200 ml  Skin: R leg surgical incisions   Diet Order: NPO  EDUCATION NEEDS: -No education needs identified at this time   Intake/Output Summary (Last 24 hours) at 08/24/14 1128 Last data filed at 08/23/14 1245  Gross per 24 hour  Intake      0 ml  Output      0 ml  Net      0 ml    Labs:   Recent Labs Lab 08/20/14 0224 08/21/14 0258 08/22/14 1145  NA 138 135* 135*  K 4.4 4.9 4.8  CL 96 93* 91*  CO2 26 24 24   BUN 23 43* 68*  CREATININE 5.61* 7.86* 10.49*  CALCIUM 8.8 8.9 8.5  PHOS 5.3* 5.6* 5.3*  GLUCOSE 104* 100* 115*    Scheduled Meds: . sodium chloride   Intravenous Once  . aspirin EC  81 mg Oral Daily  . atorvastatin  40 mg Oral q1800  . calcitRIOL  2.75 mcg Oral Once  . calcitRIOL  2.75 mcg Oral Q T,Th,Sa-HD  .  calcium acetate  2,001 mg Oral TID WC  . carvedilol  6.25 mg Oral BID WC  . cinacalcet  60 mg Oral Daily  . clopidogrel  75 mg Oral Q breakfast  . colchicine  0.3 mg Oral Once per day on Mon Thu  . darbepoetin (ARANESP) injection - DIALYSIS  60 mcg Intravenous Q Sat-HD  . docusate sodium  100 mg Oral BID  . enoxaparin (LOVENOX) injection  30 mg Subcutaneous Q24H  . feeding supplement (NEPRO CARB STEADY)  237 mL Oral BID BM  . ferric gluconate (FERRLECIT/NULECIT) IV  125 mg Intravenous Q T,Th,Sa-HD  . multivitamin  1 tablet Oral QHS  . nitroGLYCERIN  0.3 mg Transdermal Daily  . pantoprazole  40 mg Oral Daily  . predniSONE  5 mg Oral Daily    Continuous Infusions: . sodium chloride Stopped (08/18/14 1430)    Past Medical History  Diagnosis Date  . ESRD on hemodialysis     a. ESRD 2/2 HTN with renal transplant in 1987 (cadaveric) after short period of dialysis;  b. Transplant failed in 2004 and he went back on HD;  c. As of 10/15 getting HD via L thigh AVG on a TTS schedule at [facility] on [facility].  . Hypertension   . Hx of kidney transplant     a. 1987-> back on HD since 2004  . Gout tophi   . Chronic steroid use     a. Has severe gout. Did not tolerate Allopurinol. Do not taper per PCP   . Systolic CHF, chronic     2 D echo 04/2012 with EF of 45 %   . Malnutrition   . Hx SBO 04/2012    a. 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 F/U with Dr Donne Hazel. Started on TNA  . Anemia associated with chronic renal failure   . Hepatitis C   . GERD (gastroesophageal reflux disease)   . H/O hiatal hernia   . Arthritis   . History of DVT (deep vein thrombosis)   . Peripheral vascular disease     a. 12/2013 PTA of L Pop;  b. 12/2013 PTA R pop, R DP;  c. 06/2014 L Pop CBA/DCB PTA.  . Prostate cancer   . CAD (coronary artery disease)     a. 12/2013 Cath/PCI: EF 45-50%, LM Ca2+, LAD 30-40p, 73m, D1/2/3 min irregs, LCX  50ost, 30-24m, RCA 70p, 95/63m (Rota->3.0x23 Xience distal, 3.0x23 Xience mid, 3.0x28 Xience prox).  . Chronic diastolic CHF (congestive heart failure)     a. 12/2013 Echo: EF 55-60%, mild LVH, nl wall motion, Gr 2 DD.  . Dry gangrene     a. L great toe    Past Surgical History  Procedure Laterality Date  .  Laparotomy  04/14/2012    Procedure: EXPLORATORY LAPAROTOMY;  Surgeon: Joyice Faster. Cornett, MD;  Location: [place];  Service: General;  Laterality: N/A;  . Colon resection  04/14/2012  . Laparotomy  04/22/2012    Procedure: EXPLORATORY LAPAROTOMY;  Surgeon: Adin Hector, MD;  Location: [place];  Service: General;  Laterality: N/A;  lysis of adhesions, omentoplasty, repair small bowel  . Thrombectomy w/ embolectomy  04/27/2012    Procedure: THROMBECTOMY ARTERIOVENOUS GORE-TEX GRAFT;  Surgeon: Angelia Mould, MD;  Location: [place];  Service: Vascular;  Laterality: Left;  Thrombectomy of left thigh arteriovenous gortex graft  . Prostectomy  2011  . Renal grafts    . Pelvic abcess drainage Right 6/14    removal drain s/p bowl resection 13  . Transanal excision of rectal mass N/A 03/30/2013    Procedure: EXCISION OF anal MASS;  Surgeon: Joyice Faster. Cornett, MD;  Location: [place];  Service: General;  Laterality: N/A;  Exam under anesthesia with excision anal verge mass  . Aortogram  08/15/2014    abd aortogram  . Femoral-popliteal bypass graft Right 08/18/2014    Procedure: BYPASS GRAFT FEMORAL-POPLITEAL ARTERY with Gortex Graft;  Surgeon: Serafina Mitchell, MD;  Location: [place];  Service: Vascular;  Laterality: Right;    Arthur Holms, RD, LDN Pager #: [phone] After-Hours Pager #: [phone]

## 2014-08-24 NOTE — Progress Notes (Signed)
[facility] Progress Note   Subjective: had good BM w enema yest  Filed Vitals:   08/23/14 0514 08/23/14 1341 08/23/14 2003 08/24/14 0549  BP: 121/44 132/37 129/97 120/43  Pulse: 83 79 84 78  Temp: 99.3 F (37.4 C) 98.9 F (37.2 C) 98.7 F (37.1 C) 99.2 F (37.3 C)  TempSrc: Oral Oral Oral Oral  Resp: 18 18 19 20   Height:      Weight:      SpO2: 97% 100% 100% 97%   Exam: Calm, alert, thin AAM, no distress No jvd Chest clear bilat RRR 2/6 SEM no RG ABd soft, NTND, +BS R foot edema resolved, erythema improved, darkening of toes 1-3 Mild gangrene L great toe, left foot edema 1+ L thigh AVG +bruit No pretib edema  HD: TTS [facility] 3.5h 46.5 kg 2K/2Ca 400/800 Profile 2 Heparin 4000 U L thigh AVG  Calcitriol 2.75 mcg Aranesp 25 mcg on Thurs Venofer 100 mg through 12/3, then qwk   Assessment: 1. Ischemic R foot, s/p R fem-pop 11/27 - improved on exam, WBC down.  2. Ischemic L foot s/p angioplasty x 2 per [facility] (10/28, 11/11) 3. Left leg edema - for shuntogram today, per [facility] 4. ESRD on HD 5. HTN/volume - at dry weight, holding amlod, on lowdose coreg bid 6. Anemia ^'d aranesp to 60/wk, cont IV Fe, s/p 3u prbc's 7. Sec HPTH pth 713, cont vit D, sensipar, phoslo 8. Hx CAD s/p PCI/ stents 9. Constipation - s/p enema better for now    Plan- HD today, keep even    [name] MD  pager [phone]    cell [phone]  08/24/2014, 10:28 AM     Recent Labs Lab 08/20/14 0224 08/21/14 0258 08/22/14 1145  NA 138 135* 135*  K 4.4 4.9 4.8  CL 96 93* 91*  CO2 26 24 24   GLUCOSE 104* 100* 115*  BUN 23 43* 68*  CREATININE 5.61* 7.86* 10.49*  CALCIUM 8.8 8.9 8.5  PHOS 5.3* 5.6* 5.3*    Recent Labs Lab 08/20/14 0224 08/21/14 0258 08/22/14 1145  ALBUMIN 2.0* 2.0* 2.0*    Recent Labs Lab 08/22/14 0239 08/23/14 0509 08/24/14 0257  WBC 12.8* 10.0 11.6*  HGB 9.7* 9.5* 9.2*  HCT 29.7* 29.9* 29.3*  MCV 87.1  86.2 86.7  PLT 152 163 152   . sodium chloride   Intravenous Once  . aspirin EC  81 mg Oral Daily  . atorvastatin  40 mg Oral q1800  . calcitRIOL  2.75 mcg Oral Once  . calcitRIOL  2.75 mcg Oral Q T,Th,Sa-HD  . calcium acetate  2,001 mg Oral TID WC  . carvedilol  6.25 mg Oral BID WC  . cinacalcet  60 mg Oral Daily  . clopidogrel  75 mg Oral Q breakfast  . colchicine  0.3 mg Oral Once per day on Mon Thu  . darbepoetin (ARANESP) injection - DIALYSIS  60 mcg Intravenous Q Sat-HD  . docusate sodium  100 mg Oral BID  . enoxaparin (LOVENOX) injection  30 mg Subcutaneous Q24H  . feeding supplement (NEPRO CARB STEADY)  237 mL Oral BID BM  . ferric gluconate (FERRLECIT/NULECIT) IV  125 mg Intravenous Q T,Th,Sa-HD  . multivitamin  1 tablet Oral QHS  . nitroGLYCERIN  0.3 mg Transdermal Daily  . pantoprazole  40 mg Oral Daily  . predniSONE  5 mg Oral Daily   . sodium chloride Stopped (08/18/14 1430)   acetaminophen **OR** acetaminophen, bisacodyl, guaiFENesin-dextromethorphan, hydrALAZINE, labetalol, metoprolol, morphine  injection, ondansetron, oxyCODONE, phenol, senna-docusate

## 2014-08-24 NOTE — Op Note (Signed)
OPERATIVE NOTE   PROCEDURE: 1.  left thigh arteriovenous shunt cannulation under ultrasound guidance 2.  left thigh shuntogram 3.  Venoplasty of left common femoral vein x 2 (4 mm x 60 mm, 6 mm x 80 mm) 4.  Stenting of left common femoral vein (Viabahn 7 mm x 50 mm)  PRE-OPERATIVE DIAGNOSIS: Left leg venous stenosis  POST-OPERATIVE DIAGNOSIS: Left femoral vein occlusion  SURGEON: Adele Barthel, MD  ANESTHESIA: local  ESTIMATED BLOOD LOSS: 5 cc  FINDING(S): 1. Occluded distal left common femoral vein: suboptimal response to venoplasty 2. Widely patent distal external iliac vein after stenting: few millimeter overlap of left profunda venous system 3. Patent left thigh arteriovenous graft with small pseudoaneurysm overlying arterial arm  SPECIMEN(S):  None  CONTRAST: 40 cc  INDICATIONS: [name] is a 53 y.o. male who presents with swelling left leg concerning for left venous stenosis related to his left thigh arteriovenous graft.  The patient is scheduled for left thigh shuntogram, possible itnervention.  The patient is aware the risks include but are not limited to: bleeding, infection, thrombosis of the cannulated access, and possible anaphylactic reaction to the contrast.  The patient is aware of the risks of the procedure and elects to proceed forward.  DESCRIPTION: After full informed written consent was obtained, the patient was brought back to the angiography suite and placed supine upon the angiography table.  The patient was connected to monitoring equipment.  The left thigh was prepped and draped in the standard fashion for a percutaneous access intervention.  Under ultrasound guidance, the left thigh arteriovenous graft was cannulated with a micropuncture needle.  The microwire was advanced into the fistula and the needle was exchanged for the a microsheath, which was lodged 2 cm into the access.  The wire was removed and the sheath was connected to the IV extension  tubing.  Hand injections were completed to image the access from the thigh up to the level of inferior vena cava.  Based on the images, this patient will need: attempt recannulation of occluded left common femoral vein.   A Benson wire was advanced into the venous anastomosis and the sheath was exchanged for a short 6-Fr sheath.  I placed an end-hole catheter over the wire.  I did a hand injection, which demonstrated a miniscle channel to the proximal common femoral vein.  I used a Glidewire with this catheter to traverse the channel and get back into the external iliac vein.  I did a hand injection via the catheter to verify re-entry into the external iliac vein.  Based on the the imaging, a 4 mm x 60 mm angioplasty balloon was selected.  The balloon was centered around the occlusion and inflated to 10 atm for 2 minutes.   At this point, the balloon was exchanged for a 6 mm x 8 mm angioplasty balloon.  The balloon was centered around the occlusion and inflated to 18 atm for 2 minutes.  On completion imaging, a >75% residual stenosis was present.  At this point, there did not appear to be any branch venous so I felt that a stent was indicated, as I suspect this graft to clot without persistent improvement in this graft's outflow.  I selected a 7 mm x 50 mm Viabahn stent.    First, the end-hole catheter was loaded over the wire and the wire exchanged for the 0.018" Spartacore wire.  The stent was loaded over the wire, but it became apparent this  was the wrong sheath size.  The stent was removed and the wire exchanged for a Benson wire.  The sheath was exchanged for a short 7-Fr sheath.  The wire was again exchanged for a the [place] and the stent reloaded.  I centered the stent around the common femoral vein occlusion.  The stent was deployed and its delivery system removed.  I tried to reload the 6 mm x 80 mm balloon but it continued to catch the proximal edge of the stent.  I obtained a 6 mm x 60 mm 0.018"  balloon and use this to fully deploy the stent again the vein wall.  On completion imaging, the common femoral vein was widely patent.  Now that the femoral vein was patent, the deep femoral vein could finally be seen.  The stent appeared to protrude over the profunda femoral vein for a few millimeters.  Based on the completion imaging, no further intervention is necessary.  The wire and balloon were removed from the sheath.  A 4-0 Monocryl purse-string suture was sewn around the sheath.  The sheath was removed while tying down the suture.  A sterile bandage was applied to the puncture site.  COMPLICATIONS: none  CONDITION: stable  Adele Barthel, MD [facility] Office: [phone] Pager: [phone]  08/24/2014 4:43 PM

## 2014-08-24 NOTE — Progress Notes (Signed)
  [facility] Progress Note  08/24/2014 10:42 AM 6 Days Post-Op  Subjective:  Having multiple bowel movements today with enema yesterday. Still feel like he needs to have more bowel movements.    Filed Vitals:   08/24/14 0549  BP: 120/43  Pulse: 78  Temp: 99.2 F (37.3 C)  Resp: 20    Physical Exam: Incisions:  Right groin and lower leg incisions clean dry and intact.  Extremities:  Bilateral DP doppler signals. Right great toe and third toe darkening. Mild edema of left foot. Gangrene of left great toe. Left thigh graft with minimal bleeding. Bruit present.   CBC    Component Value Date/Time   WBC 11.6* 08/24/2014 0257   RBC 3.38* 08/24/2014 0257   HGB 9.2* 08/24/2014 0257   HCT 29.3* 08/24/2014 0257   PLT 152 08/24/2014 0257   MCV 86.7 08/24/2014 0257   MCH 27.2 08/24/2014 0257   MCHC 31.4 08/24/2014 0257   RDW 15.7* 08/24/2014 0257   LYMPHSABS 1.1 03/23/2013 1136   MONOABS 0.8 03/23/2013 1136   EOSABS 0.1 03/23/2013 1136   BASOSABS 0.0 03/23/2013 1136    BMET    Component Value Date/Time   NA 135* 08/22/2014 1145   K 4.8 08/22/2014 1145   CL 91* 08/22/2014 1145   CO2 24 08/22/2014 1145   GLUCOSE 115* 08/22/2014 1145   BUN 68* 08/22/2014 1145   CREATININE 10.49* 08/22/2014 1145   CREATININE 7.80* 01/16/2014 1437   CALCIUM 8.5 08/22/2014 1145   GFRNONAA 5* 08/22/2014 1145   GFRAA 6* 08/22/2014 1145    INR    Component Value Date/Time   INR 1.18 08/16/2014 1520     Intake/Output Summary (Last 24 hours) at 08/24/14 1042 Last data filed at 08/23/14 1245  Gross per 24 hour  Intake      0 ml  Output      0 ml  Net      0 ml     Assessment:  53 y.o. male is s/p: right by-pass femoral to below knee popliteal artery bypass graft with 6 mm external ring propatent PTFE  6 Days Post-Op  Plan: -Left foot edema. Negative DVT left leg. For left thigh shuntogram today.  -Doppler signals bilaterally. Toe wounds stable. Incisions healing  well.  -Continue bowel regimen.  -DVT prophylaxis:  Lovenox.    Virgina Jock, PA-C [facility] Office: [phone] Pager: [phone] 08/24/2014 10:42 AM

## 2014-08-24 NOTE — Progress Notes (Signed)
Physical Therapy Treatment Patient Details Name: [name] MRN: [id number] DOB: 1960-12-05 Today's Date: 08/24/2014    History of Present Illness [name] is a 53 y.o. AA male with a history of peripheral vascular disease, CAD s/p PCI in 12/2013,  CHF, Hepatitis C, SBO and ESRD. He had angioplasty of the right popliteal artery per Dr. Trula Slade on 11/11, but returned to the ED 11/22 with worsening right foot pain and was found to have significant plaque with reduced flows throughout the RLE. 11/25 Pt was unable to stand to attend dialysis secondary to severe right foot pain and was admitted for management.  S/p R fem-pop BPG 11/27    PT Comments    Patient is very slow with all mobility and requires frequent rest breaks due to pain.  Pt is a poor self-motivator although he does state that he knows he needs to get up and moving and complete exercises throughout the day.  Pt with multiple episodes of fecal incontinence during session; initially moved to sit on [facility], which was unproductive.  He would continue to benefit from skilled therapy to improve ROM, reduce pain, and increase functional mobility.     Follow Up Recommendations  SNF;Home health PT      Equipment Recommendations  3in1 (PT);Rolling walker with 5" wheels    Recommendations for Other Services       Precautions / Restrictions Precautions Precautions: Fall    Mobility  Bed Mobility Overal bed mobility: Modified Independent                Transfers Overall transfer level: Needs assistance Equipment used: Rolling walker (2 wheeled) Transfers: Sit to/from Stand (x4) ;Stand Pivot (x2) Transfers Sit to Stand: Min guard Stand pivot transfers: Min guard       General transfer comment: min guard assist due to pain, and difficulty fully weight bearing through Rt. LE   Ambulation/Gait                 Stairs            Wheelchair Mobility    Modified Rankin (Stroke Patients Only)        Balance Overall balance assessment: Needs assistance Sitting-balance support: Feet supported;No upper extremity supported Sitting balance-Leahy Scale: Good     Standing balance support: During functional activity Standing balance-Leahy Scale: Poor                      Cognition Arousal/Alertness: Awake/alert Behavior During Therapy: Flat affect Overall Cognitive Status: Within Functional Limits for tasks assessed                 General Comments: Pt with no obvious cognitive deficits, but does appear apathetic at times     Exercises General Exercises - Lower Extremity Ankle Circles/Pumps: AROM;Seated;Both;15 reps Long Arc Quad: AROM;Seated;10 reps;Right    General Comments        Pertinent Vitals/Pain Pain Assessment: 0-10 Pain Score: 5  Pain Location: Rt. LE Pain Descriptors / Indicators: Aching;Throbbing Pain Intervention(s): Monitored during session;Limited activity within patient's tolerance;Repositioned    Home Living                      Prior Function            PT Goals (current goals can now be found in the care plan section) Progress towards PT goals: Progressing toward goals    Frequency  Min 3X/week    PT  Plan Current plan remains appropriate    Co-evaluation             End of Session Equipment Utilized During Treatment: Gait belt Activity Tolerance: Patient limited by pain Patient left: in chair;with call bell/phone within reach     Time: 0940-1020 PT Time Calculation (min) (ACUTE ONLY): 40 min  Charges:  $Gait Training: 8-22 mins $Therapeutic Activity: 23-37 mins                    G Codes:      Kadon Andrus SPT 08/24/2014, 2:22 PM

## 2014-08-24 NOTE — Progress Notes (Signed)
Utilization review completed.  

## 2014-08-25 LAB — CBC
HCT: 29.6 % — ABNORMAL LOW (ref 39.0–52.0)
Hemoglobin: 9.4 g/dL — ABNORMAL LOW (ref 13.0–17.0)
MCH: 27.7 pg (ref 26.0–34.0)
MCHC: 31.8 g/dL (ref 30.0–36.0)
MCV: 87.3 fL (ref 78.0–100.0)
PLATELETS: 179 10*3/uL (ref 150–400)
RBC: 3.39 MIL/uL — ABNORMAL LOW (ref 4.22–5.81)
RDW: 15.7 % — AB (ref 11.5–15.5)
WBC: 10.7 10*3/uL — AB (ref 4.0–10.5)

## 2014-08-25 MED ORDER — LIDOCAINE-PRILOCAINE 2.5-2.5 % EX CREA: 1.0000 "application " | TOPICAL_CREAM | CUTANEOUS | Status: DC | PRN

## 2014-08-25 MED ORDER — BOOST / RESOURCE BREEZE PO LIQD: 1.0000 | Freq: Two times a day (BID) | ORAL | Status: DC

## 2014-08-25 MED ORDER — NEPRO/CARBSTEADY PO LIQD: 237.0000 mL | ORAL | Status: DC | PRN

## 2014-08-25 MED ORDER — ALTEPLASE 2 MG IJ SOLR
2.0000 mg | Freq: Once | INTRAMUSCULAR | Status: AC | PRN
  Filled 2014-08-25: qty 2

## 2014-08-25 MED ORDER — LIDOCAINE HCL (PF) 1 % IJ SOLN: 5.0000 mL | INTRAMUSCULAR | Status: DC | PRN

## 2014-08-25 MED ORDER — HEPARIN SODIUM (PORCINE) 1000 UNIT/ML DIALYSIS
4000.0000 [IU] | Freq: Once | INTRAMUSCULAR | Status: DC
  Filled 2014-08-25: qty 4

## 2014-08-25 MED ORDER — PENTAFLUOROPROP-TETRAFLUOROETH EX AERO: 1.0000 "application " | INHALATION_SPRAY | CUTANEOUS | Status: DC | PRN

## 2014-08-25 MED ORDER — HEPARIN SODIUM (PORCINE) 1000 UNIT/ML DIALYSIS: 1000.0000 [IU] | INTRAMUSCULAR | Status: DC | PRN

## 2014-08-25 MED ORDER — SODIUM CHLORIDE 0.9 % IV SOLN: 100.0000 mL | INTRAVENOUS | Status: DC | PRN

## 2014-08-25 MED ORDER — BOOST / RESOURCE BREEZE PO LIQD
1.0000 | Freq: Three times a day (TID) | ORAL | Status: DC
  Administered 2014-08-25 – 2014-08-27 (×5): 1 via ORAL

## 2014-08-25 NOTE — Progress Notes (Signed)
  [facility] Progress Note  08/25/2014 9:41 AM 1 Day Post-Op  Subjective:  Still having pain limiting ambulation. Still feels like he needs to have a bowel movement.    Filed Vitals:   08/25/14 0431  BP: 140/52  Pulse: 88  Temp: 102.1 F (38.9 C)  Resp: 22    Physical Exam: Incisions: Left leg incisions healing well.  Extremities:  Left thigh graft with palpable thrill. Wounds on left great toe and darkening of right 1st and 3rd toes stable. Feet are warm and well perfused. Left foot edema improved.   CBC    Component Value Date/Time   WBC 10.7* 08/25/2014 0314   RBC 3.39* 08/25/2014 0314   HGB 9.4* 08/25/2014 0314   HCT 29.6* 08/25/2014 0314   PLT 179 08/25/2014 0314   MCV 87.3 08/25/2014 0314   MCH 27.7 08/25/2014 0314   MCHC 31.8 08/25/2014 0314   RDW 15.7* 08/25/2014 0314   LYMPHSABS 1.1 03/23/2013 1136   MONOABS 0.8 03/23/2013 1136   EOSABS 0.1 03/23/2013 1136   BASOSABS 0.0 03/23/2013 1136    BMET    Component Value Date/Time   NA 136* 08/24/2014 1700   K 5.0 08/24/2014 1700   CL 93* 08/24/2014 1700   CO2 19 08/24/2014 1700   GLUCOSE 63* 08/24/2014 1700   BUN 54* 08/24/2014 1700   CREATININE 9.22* 08/24/2014 1700   CREATININE 9.22* 08/24/2014 1700   CREATININE 7.80* 01/16/2014 1437   CALCIUM 8.6 08/24/2014 1700   GFRNONAA 6* 08/24/2014 1700   GFRNONAA 6* 08/24/2014 1700   GFRAA 7* 08/24/2014 1700   GFRAA 7* 08/24/2014 1700    INR    Component Value Date/Time   INR 1.18 08/16/2014 1520     Intake/Output Summary (Last 24 hours) at 08/25/14 0941 Last data filed at 08/25/14 0231  Gross per 24 hour  Intake      3 ml  Output    -50 ml  Net     53 ml     Assessment:  53 y.o. male is s/p: left thigh shuntogram with venoplasty and stenting of left common femoral vein 1 Day Post-Op  Right femoral to below knee popliteal artery bypass graft with 6 mm external ring propatent PTFE 7 Days Post-Op  Plan: -Incisions healing  well. Feet warm and viable. Wounds of feet stable.  Left foot edema improved. -Still with limited mobility due to pain. -Continue PT/OT.  -Dispo: SNF vs home health PT. Will see what PT recommends. Will reconsult CSW. Patient however not wanting to go to SNF and would rather stay in the hospital.    Virgina Jock, PA-C [facility] Office: [phone] Pager: [phone] 08/25/2014 9:41 AM

## 2014-08-25 NOTE — Progress Notes (Signed)
Spoke with pt at bedside- pt states that he still wants to return home with HH-PT- he does not want to go to any kind of STSNF for rehab- he has been set up for HH-PT with [facility] - He is ready for discharge once cleared medically.

## 2014-08-25 NOTE — Progress Notes (Signed)
Orthopedic Tech Progress Note Patient Details:  [name] June 21, 1961 [id number]  Ortho Devices Type of Ortho Device: Darco shoe Ortho Device/Splint Location: rle Ortho Device/Splint Interventions: Application   Leanza Shepperson 08/25/2014, 1:00 PM

## 2014-08-25 NOTE — Clinical Social Work Note (Signed)
CSW received referral for SNF.  Case discussed with case manager, patient does not want to go to SNF nd plan is to discharge home with home health.  CSW to sign off please re-consult if social work needs arise.  Jones Broom. [place], MSW, [place]

## 2014-08-25 NOTE — Consult Note (Addendum)
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 Had enteric fistula rx'd with repositioning of tube.  Then had open wound connecting w abcess cavity, rx'd w ostomy bags. Repeat CT showed resolution of abcess. IR failed attempt at CV line showed occlusion of R innominate vein; also esrd on HD, chronic low PLTs, gout, HTN, chronic steroids. PICC line placed and DC'd to SNF.  10/13 - fever, tachycardia > dx was CNSS bacteremia due to infected PICC line. PICC removed, 14 day of IV vanc. LLE swelling, dopp neg for DVT, left foot erythema. Hep C since 8/13. AFP normal and abd CT neg for [facility]. 4/15 - bilat LE foot ulcers, ischemic >  rx'd by [facility] with successful angioplasty and atherectomy of left popliteal artery on 01/03/14 and right popliteal artery/ dorsalis pedis on 01/11/14.  Had CP during stay > cath showed 95% RCA lesion, underwent rotatblator / PTCA w DES x 3 to RCA lesions.  DAP prescribed. dc'd home 10/28 - 07/23/14 > bilat foot ulcers/ ischemia, hx perc interventions L leg in April  > admitted and underwent bilat LE angiogram with successful cutting and drug-coating balloon angioplasty of recurrent left popliteal art stenosis on 07/19/14. Underwent successful drug coated balloon angioplasty of R popliteal restenosis on 08/02/14. Had CP post procedure. ESRD on HD  [name] MD (pgr)  [phone]    [phone] 08/25/2014, 10:49 AM

## 2014-08-25 NOTE — Progress Notes (Signed)
Occupational Therapy Treatment Patient Details Name: [name] MRN: [id number] DOB: 1961/06/06 Today's Date: 08/25/2014    History of present illness [name] is a 53 y.o. AA male with a history of peripheral vascular disease, CAD s/p PCI in 12/2013,  CHF, Hepatitis C, SBO and ESRD. He had angioplasty of the right popliteal artery per Dr. Trula Slade on 11/11, but returned to the ED 11/22 with worsening right foot pain and was found to have significant plaque with reduced flows throughout the RLE. 11/25 Pt was unable to stand to attend dialysis secondary to severe right foot pain and was admitted for management.  S/p R fem-pop BPG 11/27   OT comments  This 53 yo male has met all of his goals and per his report he will have his sister there with him 24/7 for S and prn A as well as she can take him to/from dialysis.Feel pt is safe to D/C home. Acute OT will sign off.  Follow Up Recommendations  Home health OT;Supervision - Intermittent    Equipment Recommendations  3 in 1 bedside comode       Precautions / Restrictions Precautions Precautions: Fall Restrictions Weight Bearing Restrictions: No       Mobility Bed Mobility               General bed mobility comments: Pt up in recliner upon arrival   Transfers Overall transfer level: Needs assistance Equipment used: Rolling walker (2 wheeled) Transfers: Sit to/from Stand Sit to Stand: Supervision              Balance Overall balance assessment: Needs assistance Sitting-balance support: Feet supported;No upper extremity supported Sitting balance-Leahy Scale: Normal     Standing balance support: During functional activity Standing balance-Leahy Scale: Fair                     ADL Overall ADL's : Needs assistance/impaired                     Lower Body Dressing: Set up;Sit to/from stand   Toilet Transfer: Supervision/safety;Ambulation;RW;BSC   Toileting- Water quality scientist and Hygiene:  Supervision/safety;Sit to/from stand                          Cognition   Behavior During Therapy: [facility] for tasks assessed/performed Overall Cognitive Status: Within Functional Limits for tasks assessed                                    Pertinent Vitals/ Pain       Pain Assessment: 0-10 Pain Score: 3  Pain Location: RLE Pain Descriptors / Indicators: Aching;Throbbing Pain Intervention(s): Monitored during session;Repositioned;Premedicated before session         Frequency Min 2X/week     Progress Toward Goals  OT Goals(current goals can now be found in the care plan section)  Progress towards OT goals: Goals met/education completed, patient discharged from [place] Discharge plan remains appropriate       End of Session Equipment Utilized During Treatment: Gait belt;Rolling walker   Activity Tolerance Patient tolerated treatment well   Patient Left in chair;with call bell/phone within reach   Nurse Communication Mobility status (and feel he is safe to go home since someone is there 24/7)        Time: 3382-5053 OT Time Calculation (  min): 20 min  Charges: OT General Charges $OT Visit: 1 Procedure OT Treatments $Self Care/Home Management : 8-22 mins  Almon Register [phone] 08/25/2014, 10:30 AM

## 2014-08-25 NOTE — Progress Notes (Signed)
2mg  Morphine Sulfate wasted with Quentin Angst RN in sink during downtime.

## 2014-08-25 NOTE — Progress Notes (Signed)
[facility] Progress Note   Subjective: L thigh shuntogram yest with venoplasty and stenting of left common fem vein  Filed Vitals:   08/24/14 2230 08/24/14 2250 08/25/14 0002 08/25/14 0431  BP: 150/77 142/82 112/46 140/52  Pulse: 84 83 87 88  Temp:  98.3 F (36.8 C)  102.1 F (38.9 C)  TempSrc:  Oral  Oral  Resp: 25 24  22   Height:      Weight:      SpO2:       Exam: Calm, alert, thin AAM, no distress No jvd Chest clear bilat RRR 2/6 SEM no RG ABd soft, NTND, +BS R foot edema resolved, erythema improved, darkening of toes 1-3 Mild gangrene L great toe, left foot edema 1+ L thigh AVG +bruit No pretib edema  HD: TTS [facility] 3.5h 46.5 kg 2K/2Ca 400/800 Profile 2 Heparin 4000 U L thigh AVG  Calcitriol 2.75 mcg Aranesp 25 mcg on Thurs Venofer 100 mg through 12/3, then qwk   Assessment: 1. Ischemic R foot, s/p R fem-pop 11/27 - working with PT.  2. Ischemic L foot s/p L popliteal angioplasty x 2 per [facility] (April then October 2015) 3. Left leg edema - s/p venoplasty / stenting of left CF vein 09/03/14 4. ESRD on HD 5. HTN/volume - at dry weight, holding amlod, on lowdose coreg bid 6. Anemia ^'d aranesp to 60/wk, cont IV Fe, s/p 3u prbc's 7. Sec HPTH pth 713, cont vit D, sensipar, phoslo 8. Hx CAD s/p PCI/ stents 9. Constipation - s/p enema better for now    Plan- HD tomorrow, UF to dry wt    [name] MD  pager [phone]    cell [phone]  08/25/2014, 10:29 AM     Recent Labs Lab 08/21/14 0258 08/22/14 1145 08/24/14 1700  NA 135* 135* 136*  K 4.9 4.8 5.0  CL 93* 91* 93*  CO2 24 24 19   GLUCOSE 100* 115* 63*  BUN 43* 68* 54*  CREATININE 7.86* 10.49* 9.22*  9.22*  CALCIUM 8.9 8.5 8.6  PHOS 5.6* 5.3* 5.4*    Recent Labs Lab 08/21/14 0258 08/22/14 1145 08/24/14 1700  ALBUMIN 2.0* 2.0* 1.9*    Recent Labs Lab 08/24/14 0257 08/24/14 1700 08/25/14 0314  WBC 11.6* 8.4 10.7*  HGB 9.2* 8.7*  9.4*  HCT 29.3* 26.9* 29.6*  MCV 86.7 85.7 87.3  PLT 152 146* 179   . sodium chloride   Intravenous Once  . aspirin EC  81 mg Oral Daily  . atorvastatin  40 mg Oral q1800  . calcitRIOL  2.75 mcg Oral Once  . calcitRIOL  2.75 mcg Oral Q T,Th,Sa-HD  . calcium acetate  2,001 mg Oral TID WC  . carvedilol  6.25 mg Oral BID WC  . cinacalcet  60 mg Oral Daily  . clopidogrel  75 mg Oral Q breakfast  . colchicine  0.3 mg Oral Once per day on Mon Thu  . darbepoetin (ARANESP) injection - DIALYSIS  60 mcg Intravenous Q Sat-HD  . docusate sodium  100 mg Oral BID  . enoxaparin (LOVENOX) injection  30 mg Subcutaneous Q24H  . feeding supplement (NEPRO CARB STEADY)  237 mL Oral BID BM  . multivitamin  1 tablet Oral QHS  . nitroGLYCERIN  0.3 mg Transdermal Daily  . pantoprazole  40 mg Oral Daily  . predniSONE  5 mg Oral Daily  . sodium chloride  3 mL Intravenous Q12H   . sodium chloride Stopped (08/18/14 1430)   sodium  chloride, sodium chloride, sodium chloride, acetaminophen **OR** acetaminophen, bisacodyl, feeding supplement (NEPRO CARB STEADY), guaiFENesin-dextromethorphan, heparin, heparin, hydrALAZINE, labetalol, lidocaine (PF), lidocaine-prilocaine, metoprolol, morphine injection, ondansetron, oxyCODONE, pentafluoroprop-tetrafluoroeth, phenol, senna-docusate, sodium chloride

## 2014-08-25 NOTE — Progress Notes (Signed)
    Subjective  -  Still complaining of pain in his right foot.  He is concerned about its appearance.   Physical Exam:  Swelling in left foot is dramatically improved after angioplasty of venous outflow stenosis yesterday. Dry gangrene persists and left great toe which does have a foul odor. Dusky this on the tips of the toes on the right       Assessment/Plan:    The patient refuses to go to a SNF.  He has been cleared to go home with home health once he is medically stable.  We will try to facilitate this over the weekend.  BRABHAM IV, V. WELLS 08/25/2014 4:06 PM --  Filed Vitals:   08/25/14 0431  BP: 140/52  Pulse: 88  Temp: 102.1 F (38.9 C)  Resp: 22    Intake/Output Summary (Last 24 hours) at 08/25/14 1606 Last data filed at 08/25/14 1304  Gross per 24 hour  Intake    123 ml  Output    -47 ml  Net    170 ml     Laboratory CBC    Component Value Date/Time   WBC 10.7* 08/25/2014 0314   HGB 9.4* 08/25/2014 0314   HCT 29.6* 08/25/2014 0314   PLT 179 08/25/2014 0314    BMET    Component Value Date/Time   NA 136* 08/24/2014 1700   K 5.0 08/24/2014 1700   CL 93* 08/24/2014 1700   CO2 19 08/24/2014 1700   GLUCOSE 63* 08/24/2014 1700   BUN 54* 08/24/2014 1700   CREATININE 9.22* 08/24/2014 1700   CREATININE 9.22* 08/24/2014 1700   CREATININE 7.80* 01/16/2014 1437   CALCIUM 8.6 08/24/2014 1700   GFRNONAA 6* 08/24/2014 1700   GFRNONAA 6* 08/24/2014 1700   GFRAA 7* 08/24/2014 1700   GFRAA 7* 08/24/2014 1700    COAG Lab Results  Component Value Date   INR 1.18 08/16/2014   INR 1.11 07/20/2014   INR 0.96 01/12/2014   No results found for: PTT  Antibiotics Anti-infectives    Start     Dose/Rate Route Frequency Ordered Stop   08/18/14 1800  cefUROXime (ZINACEF) 1.5 g in dextrose 5 % 50 mL IVPB  Status:  Discontinued     1.5 g100 mL/hr over 30 Minutes Intravenous Every 12 hours 08/18/14 1631 08/18/14 1643   08/18/14 1800  cefUROXime  (ZINACEF) 1.5 g in dextrose 5 % 50 mL IVPB     1.5 g100 mL/hr over 30 Minutes Intravenous Every 24 hours 08/18/14 1643 08/18/14 1804       V. Leia Alf, M.D. [facility] [place] Office: [phone] Pager:  [phone]

## 2014-08-25 NOTE — Progress Notes (Signed)
Medicare Important Message given? YES  (If response is "NO", the following Medicare IM given date fields will be blank)  Date Medicare IM given: 08/25/14 Medicare IM given by:  Ronold Hardgrove  

## 2014-08-26 ENCOUNTER — Inpatient Hospital Stay (HOSPITAL_COMMUNITY): Payer: Medicare Other

## 2014-08-26 ENCOUNTER — Other Ambulatory Visit: Payer: Self-pay | Admitting: Adult Health

## 2014-08-26 DIAGNOSIS — N186 End stage renal disease: Secondary | ICD-10-CM

## 2014-08-26 DIAGNOSIS — R578 Other shock: Secondary | ICD-10-CM | POA: Insufficient documentation

## 2014-08-26 DIAGNOSIS — Z992 Dependence on renal dialysis: Secondary | ICD-10-CM

## 2014-08-26 DIAGNOSIS — R1032 Left lower quadrant pain: Secondary | ICD-10-CM

## 2014-08-26 DIAGNOSIS — K922 Gastrointestinal hemorrhage, unspecified: Secondary | ICD-10-CM

## 2014-08-26 LAB — RENAL FUNCTION PANEL
ALBUMIN: 1.8 g/dL — AB (ref 3.5–5.2)
ANION GAP: 16 — AB (ref 5–15)
BUN: 33 mg/dL — AB (ref 6–23)
CHLORIDE: 96 meq/L (ref 96–112)
CO2: 26 mEq/L (ref 19–32)
Calcium: 8.4 mg/dL (ref 8.4–10.5)
Creatinine, Ser: 7.1 mg/dL — ABNORMAL HIGH (ref 0.50–1.35)
GFR calc Af Amer: 9 mL/min — ABNORMAL LOW (ref >90)
GFR, EST NON AFRICAN AMERICAN: 8 mL/min — AB (ref >90)
Glucose, Bld: 98 mg/dL (ref 70–99)
POTASSIUM: 4.1 meq/L (ref 3.7–5.3)
Phosphorus: 4.5 mg/dL (ref 2.3–4.6)
SODIUM: 138 meq/L (ref 137–147)

## 2014-08-26 LAB — CBC
HCT: 24.6 % — ABNORMAL LOW (ref 39.0–52.0)
HEMATOCRIT: 26.4 % — AB (ref 39.0–52.0)
HEMATOCRIT: 18.1 % — AB (ref 39.0–52.0)
Hemoglobin: 8.3 g/dL — ABNORMAL LOW (ref 13.0–17.0)
Hemoglobin: 7.8 g/dL — ABNORMAL LOW (ref 13.0–17.0)
Hemoglobin: 5.8 g/dL — CL (ref 13.0–17.0)
MCH: 27.3 pg (ref 26.0–34.0)
MCH: 27.7 pg (ref 26.0–34.0)
MCH: 27.8 pg (ref 26.0–34.0)
MCHC: 31.4 g/dL (ref 30.0–36.0)
MCHC: 31.7 g/dL (ref 30.0–36.0)
MCHC: 32 g/dL (ref 30.0–36.0)
MCV: 86.8 fL (ref 78.0–100.0)
MCV: 87.2 fL (ref 78.0–100.0)
MCV: 86.6 fL (ref 78.0–100.0)
PLATELETS: 172 10*3/uL (ref 150–400)
Platelets: 261 10*3/uL (ref 150–400)
Platelets: 190 10*3/uL (ref 150–400)
RBC: 3.04 MIL/uL — ABNORMAL LOW (ref 4.22–5.81)
RBC: 2.82 MIL/uL — ABNORMAL LOW (ref 4.22–5.81)
RBC: 2.09 MIL/uL — AB (ref 4.22–5.81)
RDW: 15.7 % — AB (ref 11.5–15.5)
RDW: 15.6 % — AB (ref 11.5–15.5)
RDW: 15.6 % — ABNORMAL HIGH (ref 11.5–15.5)
WBC: 10.6 10*3/uL — AB (ref 4.0–10.5)
WBC: 14.4 10*3/uL — ABNORMAL HIGH (ref 4.0–10.5)
WBC: 11.7 10*3/uL — AB (ref 4.0–10.5)

## 2014-08-26 LAB — APTT: APTT: 42 s — AB (ref 24–37)

## 2014-08-26 LAB — SURGICAL PCR SCREEN
MRSA, PCR: NEGATIVE
STAPHYLOCOCCUS AUREUS: NEGATIVE

## 2014-08-26 LAB — PROTIME-INR
INR: 1.68 — ABNORMAL HIGH (ref 0.00–1.49)
Prothrombin Time: 20 seconds — ABNORMAL HIGH (ref 11.6–15.2)

## 2014-08-26 LAB — PREPARE RBC (CROSSMATCH)

## 2014-08-26 MED ORDER — SODIUM CHLORIDE 0.9 % IV BOLUS (SEPSIS)
500.0000 mL | Freq: Once | INTRAVENOUS | Status: AC
  Administered 2014-08-26: 500 mL via INTRAVENOUS

## 2014-08-26 MED ORDER — DARBEPOETIN ALFA 60 MCG/0.3ML IJ SOSY
PREFILLED_SYRINGE | INTRAMUSCULAR | Status: AC
  Administered 2014-08-26: 60 ug via INTRAVENOUS
  Filled 2014-08-26: qty 0.3

## 2014-08-26 MED ORDER — BISACODYL 10 MG RE SUPP: 10.0000 mg | Freq: Once | RECTAL | Status: DC

## 2014-08-26 MED ORDER — SODIUM CHLORIDE 0.9 % IV SOLN
Freq: Once | INTRAVENOUS | Status: AC
  Administered 2014-08-26: 19:00:00 via INTRAVENOUS

## 2014-08-26 MED ORDER — MORPHINE SULFATE 2 MG/ML IJ SOLN
INTRAMUSCULAR | Status: AC
  Filled 2014-08-26: qty 1

## 2014-08-26 MED ORDER — SODIUM CHLORIDE 0.9 % IV SOLN: Freq: Once | INTRAVENOUS | Status: DC

## 2014-08-26 MED ORDER — SODIUM CHLORIDE 0.9 % IV SOLN
INTRAVENOUS | Status: DC
  Administered 2014-08-26: 18:00:00 via INTRAVENOUS

## 2014-08-26 NOTE — Progress Notes (Signed)
Pt back to the room from dialysis. Delia Heady RN

## 2014-08-26 NOTE — Consult Note (Addendum)
Consult Note   Referring Provider: [name], MD Primary Care Physician:  Placido Sou, MD Primary Gastroenterologist:  Dr. Jerilynn Mages. Fuller Plan  Reason for Consultation:  hematochezia  HPI: [name] is a 53 y.o. male ESRD, s/p fem-pop bypass on 11/27 who has noted constipation and intermittent periumbilical crampy abdominal pain since admission. Today following HD had several large clots per rectum. No N/V or change in abd pain. Colonoscopy in 02/2010 for screening showed moderate diverticulosis ascending to sigmoid colon. History of colon perforation with colostomy and colostomy takedown in 2004. History of SBO with lysis of adhesion and SB resection complicated by anastomotic leak with an enterocutaneous fistula in 2013. Fistula eventually closed. History of anal condyloma excision in 2014.    Past Medical History  Diagnosis Date  . ESRD on hemodialysis     a. ESRD 2/2 HTN with renal transplant in 1987 (cadaveric) after short period of dialysis;  b. Transplant failed in 2004 and he went back on HD;  c. As of 10/15 getting HD via L thigh AVG on a TTS schedule at [facility] on [facility].  . Hypertension   . Hx of kidney transplant     a. 1987-> back on HD since 2004  . Gout tophi   . Chronic steroid use     a. Has severe gout. Did not tolerate Allopurinol. Do not taper per PCP   . Systolic CHF, chronic     2 D echo 04/2012 with EF of 45 %   . Malnutrition   . Hx SBO 04/2012    a. 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 F/U with Dr Donne Hazel. Started on TNA  . Anemia associated with chronic renal failure   . Hepatitis C   . GERD (gastroesophageal reflux disease)   . H/O hiatal hernia   . Arthritis   . History of DVT (deep vein thrombosis)   . Peripheral vascular disease     a. 12/2013 PTA of L Pop;  b. 12/2013 PTA R pop, R DP;  c. 06/2014 L Pop CBA/DCB PTA.  . Prostate cancer   . CAD (coronary artery  disease)     a. 12/2013 Cath/PCI: EF 45-50%, LM Ca2+, LAD 30-40p, 16m, D1/2/3 min irregs, LCX 50ost, 30-21m, RCA 70p, 95/47m (Rota->3.0x23 Xience distal, 3.0x23 Xience mid, 3.0x28 Xience prox).  . Chronic diastolic CHF (congestive heart failure)     a. 12/2013 Echo: EF 55-60%, mild LVH, nl wall motion, Gr 2 DD.  . Dry gangrene     a. L great toe    Past Surgical History  Procedure Laterality Date  . Laparotomy  04/14/2012    Procedure: EXPLORATORY LAPAROTOMY;  Surgeon: Joyice Faster. Cornett, MD;  Location: [place];  Service: General;  Laterality: N/A;  . Colon resection  04/14/2012  . Laparotomy  04/22/2012    Procedure: EXPLORATORY LAPAROTOMY;  Surgeon: Adin Hector, MD;  Location: [place];  Service: General;  Laterality: N/A;  lysis of adhesions, omentoplasty, repair small bowel  . Thrombectomy w/ embolectomy  04/27/2012    Procedure: THROMBECTOMY ARTERIOVENOUS GORE-TEX GRAFT;  Surgeon: Angelia Mould, MD;  Location: [place];  Service: Vascular;  Laterality: Left;  Thrombectomy of left thigh arteriovenous gortex graft  . Prostectomy  2011  . Renal grafts    . Pelvic abcess drainage Right 6/14    removal drain s/p bowl resection 13  . Transanal excision of rectal  mass N/A 03/30/2013    Procedure: EXCISION OF anal MASS;  Surgeon: Joyice Faster. Cornett, MD;  Location: [place];  Service: General;  Laterality: N/A;  Exam under anesthesia with excision anal verge mass  . Aortogram  08/15/2014    abd aortogram  . Femoral-popliteal bypass graft Right 08/18/2014    Procedure: BYPASS GRAFT FEMORAL-POPLITEAL ARTERY with Gortex Graft;  Surgeon: Serafina Mitchell, MD;  Location: [place];  Service: Vascular;  Laterality: Right;    Prior to Admission medications   Medication Sig Start Date End Date Taking? Authorizing Provider  amLODipine (NORVASC) 5 MG tablet Take 5 mg by mouth daily.  07/26/14  Yes Historical Provider, MD  aspirin EC 81 MG tablet Take 81 mg by mouth daily.   Yes Historical Provider, MD    atorvastatin (LIPITOR) 40 MG tablet TAKE 1 TABLET BY MOUTH DAILY AT 6 PM 08/14/14  Yes Troy Sine, MD  calcium acetate (PHOSLO) 667 MG tablet Take 2,001 mg by mouth 3 (three) times daily.  01/24/13  Yes Historical Provider, MD  carvedilol (COREG) 6.25 MG tablet TAKE 1 TABLET BY MOUTH TWICE DAILY WITH MEALS 08/14/14  Yes Troy Sine, MD  cinacalcet (SENSIPAR) 60 MG tablet Take 60 mg by mouth daily.   Yes Historical Provider, MD  clopidogrel (PLAVIX) 75 MG tablet Take 1 tablet (75 mg total) by mouth daily with breakfast. 01/14/14  Yes Lendon Colonel, NP  COLCRYS 0.6 MG tablet Take 0.6 mg by mouth daily.  01/04/13  Yes Historical Provider, MD  HYDROmorphone (DILAUDID) 2 MG tablet Take 1 to 2 tablets by mouth every 6 hours as needed for postoperative pain. Patient taking differently: Take 1-2 mg by mouth every 6 (six) hours as needed (pain).  08/07/14  Yes Serafina Mitchell, MD  LIDOCAINE EX Apply 1 application topically See admin instructions. Apply to inject site on dialysis days (Tues, Thur, Sat) if needed for pain   Yes Historical Provider, MD  loperamide (IMODIUM A-D) 2 MG tablet Take 2 mg by mouth daily as needed for diarrhea or loose stools.  08/13/12  Yes Erroll Luna, MD  nitroGLYCERIN (NITRODUR - DOSED IN MG/24 HR) 0.3 mg/hr patch Place 1 patch (0.3 mg total) onto the skin daily. 01/14/14  Yes Lendon Colonel, NP  oxyCODONE (ROXICODONE) 5 MG immediate release tablet Take 1 tablet (5 mg total) by mouth every 4 (four) hours as needed for severe pain. 08/02/14  Yes Serafina Mitchell, MD  predniSONE (DELTASONE) 5 MG tablet Take 5 mg by mouth daily.   Yes Historical Provider, MD  multivitamin (RENA-VIT) TABS tablet Take 1 tablet by mouth daily. 07/05/12   Rosalia Hammers, MD  pantoprazole (PROTONIX) 40 MG tablet TAKE 1 TABLET BY MOUTH DAILY 08/26/14   Troy Sine, MD    Current Facility-Administered Medications  Medication Dose Route Frequency Provider Last Rate Last Dose  . 0.9 %   sodium chloride infusion   Intravenous Continuous Alvia Grove, PA-C   Stopped at 08/18/14 1430  . 0.9 %  sodium chloride infusion   Intravenous Once Gaylene Brooks, CRNA      . 0.9 %  sodium chloride infusion  100 mL Intravenous PRN Sol Blazing, MD      . 0.9 %  sodium chloride infusion  100 mL Intravenous PRN Sol Blazing, MD      . 0.9 %  sodium chloride infusion  250 mL Intravenous PRN Conrad Piru, MD      .  acetaminophen (TYLENOL) tablet 325-650 mg  325-650 mg Oral Q4H PRN Alvia Grove, PA-C   325 mg at 08/20/14 1239   Or  . acetaminophen (TYLENOL) suppository 325-650 mg  325-650 mg Rectal Q4H PRN Alvia Grove, PA-C      . aspirin EC tablet 81 mg  81 mg Oral Daily Alvia Grove, PA-C   81 mg at 08/25/14 1623  . atorvastatin (LIPITOR) tablet 40 mg  40 mg Oral q1800 Alvia Grove, PA-C   40 mg at 08/25/14 1830  . bisacodyl (DULCOLAX) suppository 10 mg  10 mg Rectal Daily PRN Ulyses Amor, PA-C   10 mg at 08/25/14 1443  . bisacodyl (DULCOLAX) suppository 10 mg  10 mg Rectal Once Serafina Mitchell, MD   10 mg at 08/26/14 1527  . calcitRIOL (ROCALTROL) capsule 2.75 mcg  2.75 mcg Oral Once Ramiro Harvest, PA-C   2.75 mcg at 08/16/14 2000  . calcitRIOL (ROCALTROL) capsule 2.75 mcg  2.75 mcg Oral Q T,Th,Sa-HD Ramiro Harvest, PA-C   2.75 mcg at 08/26/14 1134  . calcium acetate (PHOSLO) capsule 2,001 mg  2,001 mg Oral TID WC Alvia Grove, PA-C   2,001 mg at 08/25/14 1622  . carvedilol (COREG) tablet 6.25 mg  6.25 mg Oral BID WC Samantha J Rhyne, PA-C   6.25 mg at 08/25/14 1622  . cinacalcet (SENSIPAR) tablet 60 mg  60 mg Oral Daily Alvia Grove, PA-C   60 mg at 08/25/14 1115  . clopidogrel (PLAVIX) tablet 75 mg  75 mg Oral Q breakfast Hulen Shouts Rhyne, PA-C   75 mg at 08/26/14 3086  . colchicine tablet 0.3 mg  0.3 mg Oral Once per day on Mon Thu Conrad Merrick, MD   0.3 mg at 08/21/14 1559  . Darbepoetin Alfa (ARANESP) injection 60 mcg  60 mcg Intravenous Q Sat-HD  Jamal Maes, MD   60 mcg at 08/26/14 1134  . docusate sodium (COLACE) capsule 100 mg  100 mg Oral BID Alvia Grove, PA-C   Stopped at 08/24/14 2200  . enoxaparin (LOVENOX) injection 30 mg  30 mg Subcutaneous Q24H Samantha J Rhyne, PA-C   30 mg at 08/25/14 1301  . feeding supplement (NEPRO CARB STEADY) liquid 237 mL  237 mL Oral PRN Sol Blazing, MD      . feeding supplement (RESOURCE BREEZE) (RESOURCE BREEZE) liquid 1 Container  1 Container Oral TID BM Rogue Bussing, RD   1 Container at 08/26/14 1500  . guaiFENesin-dextromethorphan (ROBITUSSIN DM) 100-10 MG/5ML syrup 15 mL  15 mL Oral Q4H PRN Alvia Grove, PA-C      . heparin injection 1,000 Units  1,000 Units Dialysis PRN Sol Blazing, MD      . heparin injection 2,000 Units  2,000 Units Dialysis PRN Sol Blazing, MD      . hydrALAZINE (APRESOLINE) injection 5 mg  5 mg Intravenous Q20 Min PRN Alvia Grove, PA-C      . labetalol (NORMODYNE,TRANDATE) injection 10 mg  10 mg Intravenous Q10 min PRN Alvia Grove, PA-C      . lidocaine (PF) (XYLOCAINE) 1 % injection 5 mL  5 mL Intradermal PRN Sol Blazing, MD      . lidocaine-prilocaine (EMLA) cream 1 application  1 application Topical PRN Sol Blazing, MD      . metoprolol (LOPRESSOR) injection 2-5 mg  2-5 mg Intravenous Q2H PRN Hulen Shouts Rhyne, PA-C      .  morphine 2 MG/ML injection 2-3 mg  2-3 mg Intravenous Q2H PRN Hulen Shouts Rhyne, PA-C   2 mg at 08/26/14 1047  . multivitamin (RENA-VIT) tablet 1 tablet  1 tablet Oral QHS Alvia Grove, PA-C   1 tablet at 08/24/14 2346  . nitroGLYCERIN (NITRODUR - Dosed in mg/24 hr) patch 0.3 mg  0.3 mg Transdermal Daily Alvia Grove, PA-C   0.3 mg at 08/26/14 1545  . ondansetron (ZOFRAN) injection 4 mg  4 mg Intravenous Q6H PRN Alvia Grove, PA-C      . oxyCODONE (Oxy IR/ROXICODONE) immediate release tablet 5 mg  5 mg Oral Q4H PRN Alvia Grove, PA-C   5 mg at 08/22/14 1035  . pantoprazole (PROTONIX) EC  tablet 40 mg  40 mg Oral Daily Alvia Grove, PA-C   40 mg at 08/25/14 1121  . pentafluoroprop-tetrafluoroeth (GEBAUERS) aerosol 1 application  1 application Topical PRN Sol Blazing, MD      . phenol [facility]) mouth spray 1 spray  1 spray Mouth/Throat PRN Alvia Grove, PA-C      . predniSONE (DELTASONE) tablet 5 mg  5 mg Oral Daily Alvia Grove, PA-C   5 mg at 08/25/14 1115  . senna-docusate (Senokot-S) tablet 1 tablet  1 tablet Oral BID PRN Ulyses Amor, PA-C   1 tablet at 08/23/14 1004  . sodium chloride 0.9 % injection 3 mL  3 mL Intravenous Q12H Conrad Franklin, MD   3 mL at 08/26/14 1545  . sodium chloride 0.9 % injection 3 mL  3 mL Intravenous PRN Conrad [place], MD       Facility-Administered Medications Ordered in Other Encounters  Medication Dose Route Frequency Provider Last Rate Last Dose  . fentaNYL (SUBLIMAZE) injection   Intravenous PRN Carylon Perches, MD   25 mcg at 05/05/12 1544  . midazolam (VERSED) 5 MG/5ML injection   Intravenous PRN Carylon Perches, MD   2 mg at 05/05/12 1544    Allergies as of 08/16/2014 - Review Complete 08/16/2014  Allergen Reaction Noted  . Allopurinol Other (See Comments) 02/27/2010    Family History  Problem Relation Age of Onset  . Hypertension Father   . Pneumonia Brother   . Lung disease Brother     History   Social History  . Marital Status: Single    Spouse Name: N/A    Number of Children: N/A  . Years of Education: N/A   Occupational History  . Not on file.   Social History Main Topics  . Smoking status: Former Smoker -- 1.00 packs/day for 10 years    Types: Cigarettes    Quit date: 03/23/1973  . Smokeless tobacco: Never Used     Comment: Quit 30 years ago  . Alcohol Use: No  . Drug Use: No  . Sexual Activity: No   Other Topics Concern  . Not on file   Social History Narrative   Currently in the nursing home until end of the month then will go to live with sister.           Review of  Systems: Gen: Denies any fever, chills, sweats, anorexia, fatigue, weakness, malaise, weight loss, and sleep disorder CV: Denies chest pain, angina, palpitations, syncope, orthopnea, PND, peripheral edema, and claudication. Resp: Denies dyspnea at rest, dyspnea with exercise, cough, sputum, wheezing, coughing up blood, and pleurisy. GI: Denies vomiting blood, jaundice, and fecal incontinence.   Denies dysphagia or odynophagia. GU : Denies  urinary burning, blood in urine, urinary frequency, urinary hesitancy, nocturnal urination, and urinary incontinence. MS: Denies joint pain, limitation of movement, and swelling, stiffness, low back pain, extremity pain. Denies muscle weakness, cramps, atrophy.  Derm: Denies rash, itching, dry skin, hives, moles, warts, or unhealing ulcers.  Psych: Denies depression, anxiety, memory loss, suicidal ideation, hallucinations, paranoia, and confusion. Heme: Denies bruising, bleeding, and enlarged lymph nodes. Neuro:  Denies any headaches, dizziness, paresthesias. Endo:  Denies any problems with DM, thyroid, adrenal function.  Physical Exam: Vital signs in last 24 hours: Temp:  [97.9 F (36.6 C)-101.6 F (38.7 C)] 97.9 F (36.6 C) (12/05 1511) Pulse Rate:  [67-84] 82 (12/05 1511) Resp:  [11-23] 17 (12/05 1511) BP: (110-152)/(36-62) 152/40 mmHg (12/05 1511) SpO2:  [96 %-100 %] 100 % (12/05 1511) Weight:  [101 lb 13.6 oz (46.2 kg)-102 lb 15.3 oz (46.7 kg)] 101 lb 13.6 oz (46.2 kg) (12/05 1201) Last BM Date: 08/26/14 General:   Alert, well developed, thin, acute and chronically ill appearing, cooperative Head:  Normocephalic and atraumatic. Eyes:  Sclera clear, no icterus.  Conjunctiva pink. Ears:  Normal auditory acuity. Nose:  No deformity, discharge, or lesions. Mouth:  No deformity or lesions. Oropharynx pink & moist. Neck:  Supple; no masses or thyromegaly. Lungs:  Clear throughout to auscultation. No wheezes, crackles, or rhonchi. No acute  distress. Heart:  Regular rate and rhythm; no murmurs, clicks, rubs,  or gallops. Abdomen:  Soft, minimal lower abdominal tenderness and nondistended. No masses, hepatosplenomegaly or hernias noted. Well healed midline incision. Normal bowel sounds, without guarding, and without rebound.   Rectal:  No lesions, large dark red blood clots in his bed and dark red blood on DRE  Msk:  Symmetrical without gross deformities. Normal posture. Pulses:  Normal pulses noted. Extremities:  Without clubbing or edema. Neurologic:  Alert and  oriented x4;  grossly normal neurologically. Skin:  Intact without significant lesions or rashes. Cervical Nodes:  No significant cervical adenopathy. Psych:  Alert and cooperative. Normal mood and affect.  Intake/Output from previous day: 12/04 0701 - 12/05 0700 In: 730 [P.O.:730] Out: 3 [Stool:3] Intake/Output this shift: Total I/O In: 120 [P.O.:120] Out: 400 [Other:400]  Lab Results:  Recent Labs  08/24/14 1700 08/25/14 0314 08/26/14 0352  WBC 8.4 10.7* 10.6*  HGB 8.7* 9.4* 8.3*  HCT 26.9* 29.6* 26.4*  PLT 146* 179 172   BMET  Recent Labs  08/24/14 1700 08/26/14 0840  NA 136* 138  K 5.0 4.1  CL 93* 96  CO2 19 26  GLUCOSE 63* 98  BUN 54* 33*  CREATININE 9.22*  9.22* 7.10*  CALCIUM 8.6 8.4   LFT  Recent Labs  08/26/14 0840  ALBUMIN 1.8*    Previous Endoscopies: See HPI   Impression/ Recommendations:   1. Acute GI bleed, suspected LGI source, likely diverticular bleed in a very complex patient with multiple comorbidities. Diverticulosis from ascending to sigmoid colon on 2011 colonoscopy. Possible ischemic colitis, colonic ulcer from constipation or ischemia, AVM, etc. ICU transfer. CCM consult. Volume resusitation. Transfuse to keep Hb > 8-9. If significant active bleeding persists or recurs proceed with nuclear tagged RBC scan. Hold Plavix if possible. Defer to VS and CCM.   2. Abdominal pain and constipation. Abd films today.  R/O partial SBO. May need CT scan to further evaluate.   3. Chronic anemia with ABL anemia-anticipatng Hb to drop following bleed.   4. ESRD on HD  5. S/P Fem Pop bypass on 11/27  6.  History of SBO with 2 SB resections: 70 cm of ileum and 5 cm of mid jejunum in 03/2012  7. History of segmental colectomy for perforated sigmoid diverticulitis, colostomy and colostomy takedown 2004/2005    LOS: 10 days   Malcolm T. Fuller Plan MD 08/26/2014, 4:43 PM

## 2014-08-26 NOTE — Progress Notes (Signed)
Nurse called to saw patient just had a significant GI bleed.  I cam e to se the patient, and he had a significant amount of blood in the bed.  He has been having issues with constipation.  He has a history of a partial colectomy with ostomy for perforated diverticulosis/itis and subsequent ostomy take down as well as SBO requiring LOA.  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  I asked Dr. Fuller Plan to evaluate him.  We are transferring him to the ICU for closer monitoring.  I have contacted CCM.  He is on ASA and plavix.    Checking CBC and crossing him for blood.  He is currently stable with BP of 152/40 and HR of 64   [name]

## 2014-08-26 NOTE — Procedures (Signed)
I was present at this dialysis session, have reviewed the session itself and made  appropriate changes.   No complaints, continues to heal R foot after fem-pop, but tips of several toes are dark.  L thigh/ leg swelling much better after venoplasty/stenting of stenotic femoral vein.  Prob dc this weekend to home per primary svc, pt declined recommendation for SNF.   [name] MD (pgr) [phone]    ([phone] 08/26/2014, 8:58 AM

## 2014-08-26 NOTE — Progress Notes (Signed)
Pt transferred to 2S09; report called off to [facility]; critical care MD called and ordered for 500cc NS bolus for pt and to insert a new 20gauge IV access, order placed. Pt transported off unit via bed with belongings at side by 3 RN including Agricultural consultant and a tech. Pt moved before bolus could be started by [facility] aware and informed at bedside report upon pt arrival to the unit. Francis Gaines Luie Laneve RN.

## 2014-08-26 NOTE — Progress Notes (Signed)
Pt refused all his am medication upon arrival from dialysis insisting he don't eat much and has not eaten to take his medication. Pt asked RN to help him to [facility] when RN noted bright red blood bed pad; pt was asked about hemorrhoids when he denied; pt requested for suppository or something to help him with his bowels; PA Collins was paged and Dulcolax suppository order was given; RN was called back to pt room for bloody stools; pt found in bed with blood and clots coming out of his rectum; BSC with bloody stools; Dr. Trula Slade notified; received order for CBC and Type and Screen; and stated he's consulting GI. Pt vitals taken; pt cleaned up in bed and remains in bed with continuous rectal bleeding; family at bedside and call light within reach. Will continue to monitor pt quietly. P. Amo Jadesola Poynter Rn.

## 2014-08-26 NOTE — Telephone Encounter (Signed)
Rx was sent to pharmacy electronically. 

## 2014-08-26 NOTE — Progress Notes (Signed)
[facility] Progress Note Patient Name: [name] DOB: 1961/07/21 MRN: [id number]   Date of Service  08/26/2014  HPI/Events of Note  hgb 5.8 at 2221.  INR 1.68 at 1700.    eICU Interventions  2 units PRBC and 4 units FFP ordered     Intervention Category Intermediate Interventions: Bleeding - evaluation and treatment with blood products  Mauri Brooklyn, P 08/26/2014, 11:50 PM

## 2014-08-26 NOTE — Consult Note (Addendum)
PULMONARY / CRITICAL CARE MEDICINE   Name: [name] MRN: [id number] DOB: 12-20-1960    ADMISSION DATE:  08/16/2014 CONSULTATION DATE:  08/26/2014  REFERRING MD :  Trula Slade  CHIEF COMPLAINT:  Bright red blood per rectum  INITIAL PRESENTATION: 53 year old man with ESRD on HD, known diverticulosis ,severe PAD status post right femoropopliteal bypass 11/25 and venoplasty of left femoral vein 12/3 transferred to the ICU on 12/5 for lower GI bleed. He was on aspirin and Plavix  STUDIES:    SIGNIFICANT EVENTS: 11/25 right femoropopliteal bypass 12/3 renal plasty of left femoral vein 01/03/2014 angioplasty of left femoral artery 01/11/14 angioplasty of the right popliteal artery, coronary stenting   HISTORY OF PRESENT ILLNESS:  He is maintained on dual antiplatelet therapy after emergent stent to RCA in 12/2013. He is maintained on dialysis after failed renal transplant. He has multiple interventions for severe peripheral arterial disease. He was last dialyzed today. Then developed painless bright red blood per rectum. Subsequently he developed periumbilical cramping pain. He also has a history of colonic perforation with colostomy takedown in 2004. Prior colonoscopy is apparently shown sigmoid diverticulosis. There is no history of prior GI bleeding  PMH - severe PAD, ESRD on HD w/ failed transplant, prostate CA with surgery and complications requiring additional surgeries in 2013, Hepatitis C, malnutrition, and severe PVD with dry gangrene PAST MEDICAL HISTORY :   has a past medical history of ESRD on hemodialysis; Hypertension; kidney transplant; Gout tophi; Chronic steroid use; Systolic CHF, chronic; Malnutrition; SBO (04/2012); Anemia associated with chronic renal failure; Hepatitis C; GERD (gastroesophageal reflux disease); H/O hiatal hernia; Arthritis; History of DVT (deep vein thrombosis); Peripheral vascular disease; Prostate cancer; CAD (coronary artery disease); Chronic diastolic CHF  (congestive heart failure); and Dry gangrene.  has past surgical history that includes laparotomy (04/14/2012); Colon resection (04/14/2012); laparotomy (04/22/2012); Thrombectomy w/ embolectomy (04/27/2012); prostectomy (2011); renal grafts; Pelvic abcess drainage (Right, 6/14); Transanal excision of rectal mass (N/A, 03/30/2013); Aortogram (08/15/2014); and Femoral-popliteal Bypass Graft (Right, 08/18/2014). Prior to Admission medications   Medication Sig Start Date End Date Taking? Authorizing Provider  amLODipine (NORVASC) 5 MG tablet Take 5 mg by mouth daily.  07/26/14  Yes Historical Provider, MD  aspirin EC 81 MG tablet Take 81 mg by mouth daily.   Yes Historical Provider, MD  atorvastatin (LIPITOR) 40 MG tablet TAKE 1 TABLET BY MOUTH DAILY AT 6 PM 08/14/14  Yes Troy Sine, MD  calcium acetate (PHOSLO) 667 MG tablet Take 2,001 mg by mouth 3 (three) times daily.  01/24/13  Yes Historical Provider, MD  carvedilol (COREG) 6.25 MG tablet TAKE 1 TABLET BY MOUTH TWICE DAILY WITH MEALS 08/14/14  Yes Troy Sine, MD  cinacalcet (SENSIPAR) 60 MG tablet Take 60 mg by mouth daily.   Yes Historical Provider, MD  clopidogrel (PLAVIX) 75 MG tablet Take 1 tablet (75 mg total) by mouth daily with breakfast. 01/14/14  Yes Lendon Colonel, NP  COLCRYS 0.6 MG tablet Take 0.6 mg by mouth daily.  01/04/13  Yes Historical Provider, MD  HYDROmorphone (DILAUDID) 2 MG tablet Take 1 to 2 tablets by mouth every 6 hours as needed for postoperative pain. Patient taking differently: Take 1-2 mg by mouth every 6 (six) hours as needed (pain).  08/07/14  Yes Serafina Mitchell, MD  LIDOCAINE EX Apply 1 application topically See admin instructions. Apply to inject site on dialysis days (Tues, Thur, Sat) if needed for pain   Yes Historical Provider, MD  loperamide (  IMODIUM A-D) 2 MG tablet Take 2 mg by mouth daily as needed for diarrhea or loose stools.  08/13/12  Yes Erroll Luna, MD  nitroGLYCERIN (NITRODUR - DOSED IN MG/24 HR)  0.3 mg/hr patch Place 1 patch (0.3 mg total) onto the skin daily. 01/14/14  Yes Lendon Colonel, NP  oxyCODONE (ROXICODONE) 5 MG immediate release tablet Take 1 tablet (5 mg total) by mouth every 4 (four) hours as needed for severe pain. 08/02/14  Yes Serafina Mitchell, MD  predniSONE (DELTASONE) 5 MG tablet Take 5 mg by mouth daily.   Yes Historical Provider, MD  multivitamin (RENA-VIT) TABS tablet Take 1 tablet by mouth daily. 07/05/12   Rosalia Hammers, MD  pantoprazole (PROTONIX) 40 MG tablet TAKE 1 TABLET BY MOUTH DAILY 08/26/14   Troy Sine, MD   Allergies  Allergen Reactions  . Allopurinol Other (See Comments)    REACTION: decreased platelets    FAMILY HISTORY:  indicated that his mother is deceased. He indicated that his father is deceased. He indicated that his sister is alive. He indicated that his brother is deceased.  SOCIAL HISTORY:  reports that he quit smoking about 41 years ago. His smoking use included Cigarettes. He has a 10 pack-year smoking history. He has never used smokeless tobacco. He reports that he does not drink alcohol or use illicit drugs.  REVIEW OF SYSTEMS:  Constitutional: negative for  fevers and sweats  Eyes: negative for irritation, redness and visual disturbance  Ears, nose, mouth, throat, and face: negative for earaches, epistaxis, nasal congestion and sore throat  Respiratory: negative for cough, dyspnea on exertion, sputum and wheezing  Cardiovascular: negative for chest pain, dyspnea, orthopnea, palpitations and syncope  Gastrointestinal: Positive for abdominal pain, constipation,NO nausea and vomiting  Genitourinary:negative for dysuria, frequency and hematuria  Hematologic/lymphatic: negative for bleeding, easy bruising and lymphadenopathy  Musculoskeletal:negative for arthralgias, muscle weakness and stiff joints  Neurological: negative for coordination problems, gait problems, headaches and weakness  Endocrine: negative for diabetic symptoms  including polydipsia, polyuria and weight loss   SUBJECTIVE:   VITAL SIGNS: Temp:  [97.9 F (36.6 C)-101.6 F (38.7 C)] 97.9 F (36.6 C) (12/05 1511) Pulse Rate:  [67-84] 82 (12/05 1511) Resp:  [11-23] 17 (12/05 1511) BP: (110-152)/(38-62) 152/40 mmHg (12/05 1511) SpO2:  [96 %-100 %] 100 % (12/05 1511) Weight:  [46.2 kg (101 lb 13.6 oz)-46.7 kg (102 lb 15.3 oz)] 46.2 kg (101 lb 13.6 oz) (12/05 1201) HEMODYNAMICS:   VENTILATOR SETTINGS:   INTAKE / OUTPUT:  Intake/Output Summary (Last 24 hours) at 08/26/14 1739 Last data filed at 08/26/14 1511  Gross per 24 hour  Intake    370 ml  Output    400 ml  Net    -30 ml    PHYSICAL EXAMINATION: General:  Chronically ill-appearing, moderately nourished Neuro:  Nonfocal HEENT:  Pale, no icterus Cardiovascular:  Ejection systolic murmur 2/6 at base, tachycardia Lungs:  Clear Abdomen:  Soft, midline scar, right groin recent incision healing well Musculoskeletal:  Good peripheral pulses, no edema, BUE grafts, RUE good pulse Skin:  Dry gangrene right toes  LABS:  CBC  Recent Labs Lab 08/24/14 1700 08/25/14 0314 08/26/14 0352  WBC 8.4 10.7* 10.6*  HGB 8.7* 9.4* 8.3*  HCT 26.9* 29.6* 26.4*  PLT 146* 179 172   Coag's No results for input(s): APTT, INR in the last 168 hours. BMET  Recent Labs Lab 08/22/14 1145 08/24/14 1700 08/26/14 0840  NA 135* 136* 138  K  4.8 5.0 4.1  CL 91* 93* 96  CO2 24 19 26   BUN 68* 54* 33*  CREATININE 10.49* 9.22*  9.22* 7.10*  GLUCOSE 115* 63* 98   Electrolytes  Recent Labs Lab 08/22/14 1145 08/24/14 1700 08/26/14 0840  CALCIUM 8.5 8.6 8.4  PHOS 5.3* 5.4* 4.5   Sepsis Markers No results for input(s): LATICACIDVEN, PROCALCITON, O2SATVEN in the last 168 hours. ABG No results for input(s): PHART, PCO2ART, PO2ART in the last 168 hours. Liver Enzymes  Recent Labs Lab 08/22/14 1145 08/24/14 1700 08/26/14 0840  ALBUMIN 2.0* 1.9* 1.8*   Cardiac Enzymes No results for  input(s): TROPONINI, PROBNP in the last 168 hours. Glucose  Recent Labs Lab 08/20/14 0804  GLUCAP 96    Imaging No results found.   ASSESSMENT / PLAN:  PULMONARY  A: No issue s P:   will monitor  CARDIOVASCULAR  A: Hypertension  Hemorrhagic shock -high risk Known CAD , status post des P:  Hold aspirin and Plavix  Hold Coreg and amlodipine   RENAL A:  esrd on HD Left thigh shunt P:   Completed HD today   GASTROINTESTINAL A:  lgib , diverticular source most likely , differential includes ischemic colitis  P:   Colonoscopy planned in a.m.  If hemodynamic instability tonight , we'll proceed with nuclear tagged scan   HEMATOLOGIC A:  Acute blood loss anemia P:  Transfuse 1 unit PRBC, aim for hemoglobin 8 and above Check serial hemoglobin  INFECTIOUS A:  No issues P:     ENDOCRINE A:  No issues     NEUROLOGIC A:  Chronic pain P:   Careful with pain meds   FAMILY  - Updates: pt updated 12/5    TODAY'S SUMMARY: LGIB - transfuse 1 unit emergently, if hemodynamic instability, plan for nuclear scan-otherwise colonoscopy in a.m. Will hold aspirin and Plavix in the short-term-until source of bleed is clear. He does have drug-eluting stent  The patient is critically ill with multiple organ systems failure and requires high complexity decision making for assessment and support, frequent evaluation and titration of therapies, application of advanced monitoring technologies and extensive interpretation of multiple databases. Critical Care Time devoted to patient care services described in this note  is 45 minutes.    Kara Mead MD. Shade Flood. [facility] Pager [phone] If no response call [phone]    08/26/2014, 5:39 PM

## 2014-08-26 NOTE — Progress Notes (Signed)
   [facility]   Subjective  - Painful right foot and toes.  Decreased edema in the left foot and leg.   Objective 134/49 72 98 F (36.7 C) (Oral) 20 96%  Intake/Output Summary (Last 24 hours) at 08/26/14 0946 Last data filed at 08/26/14 0057  Gross per 24 hour  Intake    610 ml  Output      2 ml  Net    608 ml    Left great toe malodorous gangrene No apparent edema Right foot Dusky toes no change AROM   Assessment/Planning: 53 y.o. male is s/p: left thigh shuntogram with venoplasty and stenting of left common femoral vein 2 Day Post-Op  Right femoral to below knee popliteal artery bypass graft with 6 mm external ring propatent PTFE 8 Days Post-Op  PT limited mobility plan to discharge once stable to home with [facility] patient refuses SNF.   Laurence Slate [facility] 08/26/2014 9:46 AM --  Laboratory Lab Results:  Recent Labs  08/25/14 0314 08/26/14 0352  WBC 10.7* 10.6*  HGB 9.4* 8.3*  HCT 29.6* 26.4*  PLT 179 172   BMET  Recent Labs  08/24/14 1700 08/26/14 0840  NA 136* 138  K 5.0 4.1  CL 93* 96  CO2 19 26  GLUCOSE 63* 98  BUN 54* 33*  CREATININE 9.22*  9.22* 7.10*  CALCIUM 8.6 8.4    COAG Lab Results  Component Value Date   INR 1.18 08/16/2014   INR 1.11 07/20/2014   INR 0.96 01/12/2014   No results found for: PTT

## 2014-08-26 NOTE — Progress Notes (Signed)
Pt alert, tolerated HD tx. Vss, no c/o. UF goal of 1079ml met. Report called to [place] 2W.

## 2014-08-27 ENCOUNTER — Inpatient Hospital Stay (HOSPITAL_COMMUNITY): Payer: Medicare Other

## 2014-08-27 DIAGNOSIS — R579 Shock, unspecified: Secondary | ICD-10-CM

## 2014-08-27 LAB — PROTIME-INR
INR: 1.23 (ref 0.00–1.49)
Prothrombin Time: 15.6 seconds — ABNORMAL HIGH (ref 11.6–15.2)

## 2014-08-27 LAB — PREPARE RBC (CROSSMATCH)

## 2014-08-27 LAB — CBC
HCT: 31.2 % — ABNORMAL LOW (ref 39.0–52.0)
HEMATOCRIT: 22.6 % — AB (ref 39.0–52.0)
HEMATOCRIT: 24.5 % — AB (ref 39.0–52.0)
HEMOGLOBIN: 7.4 g/dL — AB (ref 13.0–17.0)
Hemoglobin: 8.3 g/dL — ABNORMAL LOW (ref 13.0–17.0)
Hemoglobin: 10.6 g/dL — ABNORMAL LOW (ref 13.0–17.0)
MCH: 27.8 pg (ref 26.0–34.0)
MCH: 28 pg (ref 26.0–34.0)
MCH: 28 pg (ref 26.0–34.0)
MCHC: 32.7 g/dL (ref 30.0–36.0)
MCHC: 33.9 g/dL (ref 30.0–36.0)
MCHC: 34 g/dL (ref 30.0–36.0)
MCV: 85 fL (ref 78.0–100.0)
MCV: 82.8 fL (ref 78.0–100.0)
MCV: 82.5 fL (ref 78.0–100.0)
PLATELETS: 121 10*3/uL — AB (ref 150–400)
PLATELETS: 138 10*3/uL — AB (ref 150–400)
Platelets: 134 10*3/uL — ABNORMAL LOW (ref 150–400)
RBC: 2.66 MIL/uL — ABNORMAL LOW (ref 4.22–5.81)
RBC: 2.96 MIL/uL — ABNORMAL LOW (ref 4.22–5.81)
RBC: 3.78 MIL/uL — AB (ref 4.22–5.81)
RDW: 15.7 % — AB (ref 11.5–15.5)
RDW: 16.5 % — AB (ref 11.5–15.5)
RDW: 16.5 % — ABNORMAL HIGH (ref 11.5–15.5)
WBC: 7.7 10*3/uL (ref 4.0–10.5)
WBC: 7.1 10*3/uL (ref 4.0–10.5)
WBC: 12 10*3/uL — ABNORMAL HIGH (ref 4.0–10.5)

## 2014-08-27 LAB — APTT: aPTT: 37 seconds (ref 24–37)

## 2014-08-27 MED ORDER — VITAMIN K1 10 MG/ML IJ SOLN
5.0000 mg | Freq: Once | INTRAVENOUS | Status: AC
  Administered 2014-08-27: 5 mg via INTRAVENOUS
  Filled 2014-08-27: qty 0.5

## 2014-08-27 MED ORDER — IOHEXOL 300 MG/ML  SOLN
100.0000 mL | Freq: Once | INTRAMUSCULAR | Status: AC | PRN
  Administered 2014-08-27: 100 mL via INTRAVENOUS

## 2014-08-27 MED ORDER — AMLODIPINE BESYLATE 2.5 MG PO TABS
2.5000 mg | ORAL_TABLET | Freq: Two times a day (BID) | ORAL | Status: DC
  Administered 2014-08-27 – 2014-08-28 (×2): 2.5 mg via ORAL
  Filled 2014-08-27 (×3): qty 1

## 2014-08-27 MED ORDER — SODIUM CHLORIDE 0.9 % IV SOLN: Freq: Once | INTRAVENOUS | Status: DC

## 2014-08-27 NOTE — Progress Notes (Signed)
Daily Rounding Note  08/27/2014, 8:25 AM  LOS: 11 days   SUBJECTIVE:       Passing bloody stool.  Not a lot of abdominal pain.  No nausea.  Appetite reduced.  SOB, voice is hoarse: not the norm.  OBJECTIVE:         Vital signs in last 24 hours:    Temp:  [97.9 F (36.6 C)-99.5 F (37.5 C)] 99 F (37.2 C) (12/06 0700) Pulse Rate:  [59-98] 75 (12/06 0715) Resp:  [11-32] 21 (12/06 0715) BP: (82-167)/(25-108) 135/108 mmHg (12/06 0715) SpO2:  [90 %-100 %] 100 % (12/06 0715) Weight:  [101 lb 13.6 oz (46.2 kg)-102 lb 15.3 oz (46.7 kg)] 101 lb 13.6 oz (46.2 kg) (12/05 1201) Last BM Date: 08/26/14 Filed Weights   08/24/14 1915 08/26/14 0830 08/26/14 1201  Weight: 108 lb 3.9 oz (49.1 kg) 102 lb 15.3 oz (46.7 kg) 101 lb 13.6 oz (46.2 kg)   General: cachectic, chronically ill   Heart: RRR with faint 1/6 systolic murmer Chest: clear in front.  Abdomen: soft, mild left sided tenderness, no guard or rebound.  BS reduced.  Midline scar.  Blood being cleaned off behind and it bed pad Extremities: no CCE Neuro/Psych:  Oriented x 3.  No gross deficits.  Flat affect  Intake/Output from previous day: 12/05 0701 - 12/06 0700 In: 4063 [P.O.:120; I.V.:2339; QHUTM:5465] Out: 400   Intake/Output this shift:    Lab Results:  Recent Labs  08/26/14 1730 08/26/14 2221 08/27/14 0540  WBC 14.4* 11.7* 7.7  HGB 7.8* 5.8* 7.4*  HCT 24.6* 18.1* 22.6*  PLT 261 190 134*   BMET  Recent Labs  08/24/14 1700 08/26/14 0840  NA 136* 138  K 5.0 4.1  CL 93* 96  CO2 19 26  GLUCOSE 63* 98  BUN 54* 33*  CREATININE 9.22*  9.22* 7.10*  CALCIUM 8.6 8.4   LFT  Recent Labs  08/24/14 1700 08/26/14 0840  ALBUMIN 1.9* 1.8*   PT/INR  Recent Labs  08/26/14 1730  LABPROT 20.0*  INR 1.68*   Hepatitis Panel No results for input(s): HEPBSAG, HCVAB, HEPAIGM, HEPBIGM in the last 72 hours.  Studies/Results: Ct Abdomen Pelvis W  Contrast 08/27/2014  COMPARISON:  CT of the abdomen and pelvis from 11/10/2012  FINDINGS: Trace bilateral pleural effusions are seen, with bibasilar atelectasis.  A nonspecific 6 mm hypodensity within the left hepatic lobe is of uncertain significance. The liver is otherwise unremarkable. The spleen is mildly bulky, but remains normal in length. There is persistent chronic gallbladder wall thickening. The gallbladder is difficult to fully assess given trace ascites tracking about the liver.  The pancreas and adrenal glands are unremarkable.  The patient's native kidneys are markedly atrophic, with scattered bilateral renal cysts of varying size and attenuation. Scattered associated calcifications are seen. There is no evidence of hydronephrosis. No obstructing renal stones are seen.  There is marked fatty infiltration with regard to the transplant kidney at the left iliac fossa, with marked thinning of the renal parenchyma. Diffuse associated vascular calcifications are seen. This appearance is stable from 2014.  There is mild diffuse mesenteric and omental edema, nonspecific in appearance.  The stomach is within normal limits. No acute vascular abnormalities are seen. Diffuse calcification is seen along the abdominal aorta and its branches. Extensive diffuse vascular calcification is noted throughout the abdomen and pelvis.  The patient appears to be status post resection of much of the  bowel. Remaining small bowel is grossly unremarkable. A distended anastomosis is noted at the right hemipelvis; this corresponds to the site of the prior right pelvic catheter, which has been removed.  The appendix is not definitely characterized; there is no evidence for appendicitis. Contrast progresses to the level of the proximal sigmoid colon. There is scattered diverticulosis along the transverse colon. Stool is noted filling the distal sigmoid colon and rectum.  There is soft tissue inflammation about the distal sigmoid colon  and rectum, raising question for proctitis. Diffuse presacral stranding is also noted, new from the prior study.  The bladder is not well assessed. Soft tissue density about the expected location of the bladder is relatively stable in appearance. 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 This is perhaps slightly more prominent than on the prior study. No inguinal lymphadenopathy is seen.  No acute osseous abnormalities are identified.  IMPRESSION: 1. Soft tissue inflammation about the distal sigmoid colon and rectum, with diffuse presacral stranding, new from the prior study. This raises concern for proctitis, which may explain the patient's bright red blood per rectum. Stool noted filling the distal sigmoid colon and rectum. 2. Scattered diverticulosis along the transverse colon, without evidence of diverticulitis; the colon is otherwise grossly unremarkable. 3. Trace bilateral pleural effusions, with bibasilar atelectasis. 4. Marked atrophy of the patient's bilateral native kidneys, with bilateral renal cysts and scattered calcifications. Marked fatty infiltration of the transplant kidney at the left iliac fossa, with marked thinning of the renal parenchyma. This appearance is stable from 2014. 5. Stable appearance to soft tissue density about the expected location of the bladder. 6. 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 7. Diffuse calcification along the abdominal aorta and its branches. Extensive diffuse vascular calcification seen throughout the abdomen and pelvis. 8. Nonspecific 6 mm hypodensity within the left hepatic lobe, new from prior studies. 9. Persistent chronic gallbladder wall thickening; gallbladder difficult to fully assess given trace ascites about the liver, but this  appearance is relatively stable. 10. Mild nonspecific diffuse mesenteric and omental edema.   Electronically Signed   By: Garald Balding M.D.   On: 08/27/2014 01:30   Dg Abd Portable 1v  08/26/2014   CLINICAL DATA:  Acute GI bleed  EXAM: PORTABLE ABDOMEN - 1 VIEW  COMPARISON:  CT abdomen pelvis dated 11/10/2012  FINDINGS: Nonspecific but nonobstructive bowel gas pattern.  Surgical clips overlying the pelvis.  Vascular calcifications.  IMPRESSION: Unremarkable abdominal radiograph.   Electronically Signed   By: Julian Hy M.D.   On: 08/26/2014 22:30   Scheduled Meds: . sodium chloride   Intravenous Once  . sodium chloride   Intravenous Once  . sodium chloride   Intravenous Once  . atorvastatin  40 mg Oral q1800  . bisacodyl  10 mg Rectal Once  . calcitRIOL  2.75 mcg Oral Once  . calcitRIOL  2.75 mcg Oral Q T,Th,Sa-HD  . calcium acetate  2,001 mg Oral TID WC  . cinacalcet  60 mg Oral Daily  . colchicine  0.3 mg Oral Once per day on Mon Thu  . darbepoetin (ARANESP) injection - DIALYSIS  60 mcg Intravenous Q Sat-HD  . docusate sodium  100 mg Oral BID  . feeding supplement (RESOURCE BREEZE)  1 Container Oral TID BM  . multivitamin  1 tablet  Oral QHS  . nitroGLYCERIN  0.3 mg Transdermal Daily  . pantoprazole  40 mg Oral Daily  . phytonadione (VITAMIN K) IV  5 mg Intravenous Once  . predniSONE  5 mg Oral Daily  . sodium chloride  3 mL Intravenous Q12H   Continuous Infusions: . sodium chloride Stopped (08/18/14 1430)   PRN Meds:.sodium chloride, sodium chloride, sodium chloride, acetaminophen **OR** acetaminophen, feeding supplement (NEPRO CARB STEADY), guaiFENesin-dextromethorphan, hydrALAZINE, labetalol, lidocaine (PF), lidocaine-prilocaine, metoprolol, morphine injection, ondansetron, oxyCODONE, pentafluoroprop-tetrafluoroeth, phenol, senna-docusate, sodium chloride   ASSESMENT:   *  Hematochezia, no acute abdominal pain.  CT scan most c/w ischemic colitis.    *  Chronic Plavix.   On hold.   *  ABL on top of chronic anemia.  S/p 5 PRBCs.   *  Coagulopathy.  S/p Vitamin K and FFP x 4 units.  Not on Coumadin pta. .    *  ESRD.  On HD.   *  S/p 2 previous SB resections. S/p segmental colectomy for perforated diverticulits, colostomy ultimately reversed.   *  ASPVD.  Fem pop bypass 08/18/14. Extensive vascular disease noted on CT.   *  Protein malnutrition.    PLAN   *  Supportive care.  Blood products as needed.   *  ? Allow clears?     Azucena Freed  08/27/2014, 8:25 AM Pager: [phone]     Attending physician's note   I have taken an interval history, reviewed the chart and examined the patient. I agree with the Advanced Practitioner's note, impression and recommendations. CT scan findings c/w ischemic colitis which is the likely source of bleed. Bowel rest and supportive care for now. Bleeding has substantially slowed or has stopped. Consider colonoscopy to further evaluate. Follow PT/PTT and CBC. Transfusions to keep Hb > 8-9.   Pricilla Riffle. Fuller Plan, MD [facility]

## 2014-08-27 NOTE — Progress Notes (Addendum)
PULMONARY / CRITICAL CARE MEDICINE   Name: [name] MRN: [id number] DOB: Aug 31, 1961    ADMISSION DATE:  08/16/2014 CONSULTATION DATE:  08/27/2014  REFERRING MD :  Trula Slade  CHIEF COMPLAINT:  Bright red blood per rectum  INITIAL PRESENTATION: 53 year old man with ESRD on HD, known diverticulosis ,severe PAD status post right femoropopliteal bypass 11/25 and venoplasty of left femoral vein 12/3 transferred to the ICU on 12/5 for lower GI bleed. He was on aspirin and Plavix  after emergent DES to RCA in 12/2013.  PMH - severe PAD, ESRD on HD w/ failed transplant, prostate CA with surgery and complications requiring additional surgeries in 2013, Hepatitis C, malnutrition, and severe PVD with dry gangrene  STUDIES:    SIGNIFICANT EVENTS: 11/25 right femoropopliteal bypass 12/3 renal plasty of left femoral vein 01/03/2014 angioplasty of left femoral artery 01/11/14 angioplasty of the right popliteal artery, coronary stenting   SUBJECTIVE: bloody smear PR Abdominal pain has decreased afebrile  VITAL SIGNS: Temp:  [97.9 F (36.6 C)-99.5 F (37.5 C)] 99 F (37.2 C) (12/06 0700) Pulse Rate:  [59-98] 75 (12/06 0715) Resp:  [11-32] 21 (12/06 0715) BP: (82-167)/(25-108) 135/108 mmHg (12/06 0715) SpO2:  [90 %-100 %] 100 % (12/06 0715) Weight:  [46.2 kg (101 lb 13.6 oz)-46.7 kg (102 lb 15.3 oz)] 46.2 kg (101 lb 13.6 oz) (12/05 1201) HEMODYNAMICS:   VENTILATOR SETTINGS:   INTAKE / OUTPUT:  Intake/Output Summary (Last 24 hours) at 08/27/14 0758 Last data filed at 08/27/14 0645  Gross per 24 hour  Intake   4063 ml  Output    400 ml  Net   3663 ml    PHYSICAL EXAMINATION: General:  Chronically ill-appearing, poorly nourished Neuro:  Nonfocal, alert, awake HEENT:  Pale, no icterus Cardiovascular:  Ejection systolic murmur 2/6 at base, tachycardia Lungs:  Clear Abdomen:  Soft, midline scar, right groin recent incision healing well Musculoskeletal:  Good peripheral pulses, no  edema, BUE grafts, RUE good pulse Skin:  Dry gangrene right toes  LABS:  CBC  Recent Labs Lab 08/26/14 1730 08/26/14 2221 08/27/14 0540  WBC 14.4* 11.7* 7.7  HGB 7.8* 5.8* 7.4*  HCT 24.6* 18.1* 22.6*  PLT 261 190 134*   Coag's  Recent Labs Lab 08/26/14 1730  APTT 42*  INR 1.68*   BMET  Recent Labs Lab 08/22/14 1145 08/24/14 1700 08/26/14 0840  NA 135* 136* 138  K 4.8 5.0 4.1  CL 91* 93* 96  CO2 24 19 26   BUN 68* 54* 33*  CREATININE 10.49* 9.22*  9.22* 7.10*  GLUCOSE 115* 63* 98   Electrolytes  Recent Labs Lab 08/22/14 1145 08/24/14 1700 08/26/14 0840  CALCIUM 8.5 8.6 8.4  PHOS 5.3* 5.4* 4.5   Sepsis Markers No results for input(s): LATICACIDVEN, PROCALCITON, O2SATVEN in the last 168 hours. ABG No results for input(s): PHART, PCO2ART, PO2ART in the last 168 hours. Liver Enzymes  Recent Labs Lab 08/22/14 1145 08/24/14 1700 08/26/14 0840  ALBUMIN 2.0* 1.9* 1.8*   Cardiac Enzymes No results for input(s): TROPONINI, PROBNP in the last 168 hours. Glucose  Recent Labs Lab 08/20/14 0804  GLUCAP 96    Imaging Dg Abd Portable 1v  08/26/2014   CLINICAL DATA:  Acute GI bleed  EXAM: PORTABLE ABDOMEN - 1 VIEW  COMPARISON:  CT abdomen pelvis dated 11/10/2012  FINDINGS: Nonspecific but nonobstructive bowel gas pattern.  Surgical clips overlying the pelvis.  Vascular calcifications.  IMPRESSION: Unremarkable abdominal radiograph.   Electronically Signed   By:  Julian Hy M.D.   On: 08/26/2014 22:30     ASSESSMENT / PLAN:  PULMONARY  A: No issue s P:   will monitor  CARDIOVASCULAR  A: Hypertension  Hemorrhagic shock -high risk Known CAD , status post des P:  Hold aspirin and Plavix - will have to resume soon,may need cards input Hold Coreg and amlodipine   RENAL A:  esrd on HD Left thigh shunt P:   Completed HD 12/5  GASTROINTESTINAL A:  lgib , diverticular source most likely , differential includes ischemic colitis  P:    Colonoscopy planned  If hemodynamic instability  ,proceed with nuclear tagged scan   HEMATOLOGIC A:  Acute blood loss anemia, lowest 5.8 Coagulopathy -vit K def P:  Transfuse 1 unit PRBC, aim for hemoglobin 8 and above Check serial hemoglobin Vit K x1  INFECTIOUS A:  No issues P:     ENDOCRINE A: Unclear why on prednisone ? gout   NEUROLOGIC A:  Chronic pain P:   Careful with pain meds   FAMILY  - Updates: pt updated 12/5    TODAY'S SUMMARY: LGIB - transfuse to goal 8 Will hold aspirin and Plavix in the short-term-until source of bleed is clear. He does have drug-eluting stent    Kara Mead MD. FCCP. [place] [facility] Pager [phone] If no response call [phone]    08/27/2014, 7:58 AM

## 2014-08-27 NOTE — Progress Notes (Signed)
[facility] Progress Note   Subjective: GIB overnight , per GI prob ischemic colitis. Stable now, got 4u prbc and 4u FFP last 24 hours.   Filed Vitals:   08/27/14 1500 08/27/14 1600 08/27/14 1627 08/27/14 1700  BP: 141/43 154/85  166/84  Pulse: 78 84  78  Temp: 98.8 F (37.1 C) 98.7 F (37.1 C) 98.4 F (36.9 C)   TempSrc: Oral Oral Oral   Resp: 24 24  18   Height:      Weight:      SpO2: 100% 98%  97%   Exam: Calm, alert, thin AAM, no distress No jvd Chest clear bilat RRR 2/6 SEM no RG ABd soft, NTND, +BS R foot edema resolved, erythema improved, darkening of toes 1-3 Mild gangrene L great toe, left foot edema 1+ L thigh AVG +bruit No pretib edema  HD: TTS [facility] 3.5h 46.5 kg 2K/2Ca 400/800 Profile 2 Heparin 4000 U L thigh AVG  Calcitriol 2.75 mcg Aranesp 25 mcg on Thurs Venofer 100 mg through 12/3, then qwk   Assessment: 1. Acute GI bleed - s/p 4u prbc and 4u FFP. Stable now. Per GI. Possible colonscopy, transfuse prn, holding plavix 2. Ischemic R foot, s/p R fem-pop 11/27 3. Ischemic L foot s/p L popliteal angioplasty x 2 per [facility] (April then October 2015) 4. Left leg edema - resolved, s/p venoplasty / stenting of left CF vein 09/03/14 5. ESRD on HD - no hep HD for now given GIB 6. HTN/volume - BP up after blood products, will resume low dose norvasc 2.5 bid. At dry wt.  7. Anemia ^'d aranesp to 60/wk, cont IV Fe 8. Sec HPTH pth 713, cont vit D, sensipar, phoslo 9. Hx CAD s/p PCI/ stents - on NTG patch 10. Constipation - resolved    Plan- next HD Tuesday    [name] MD  pager [phone]    cell [phone]  08/27/2014, 5:24 PM     Recent Labs Lab 08/22/14 1145 08/24/14 1700 08/26/14 0840  NA 135* 136* 138  K 4.8 5.0 4.1  CL 91* 93* 96  CO2 24 19 26   GLUCOSE 115* 63* 98  BUN 68* 54* 33*  CREATININE 10.49* 9.22*  9.22* 7.10*  CALCIUM 8.5 8.6 8.4  PHOS 5.3* 5.4* 4.5    Recent Labs Lab  08/22/14 1145 08/24/14 1700 08/26/14 0840  ALBUMIN 2.0* 1.9* 1.8*    Recent Labs Lab 08/26/14 2221 08/27/14 0540 08/27/14 1254  WBC 11.7* 7.7 7.1  HGB 5.8* 7.4* 8.3*  HCT 18.1* 22.6* 24.5*  MCV 86.6 85.0 82.8  PLT 190 134* 121*   . sodium chloride   Intravenous Once  . sodium chloride   Intravenous Once  . sodium chloride   Intravenous Once  . atorvastatin  40 mg Oral q1800  . bisacodyl  10 mg Rectal Once  . calcitRIOL  2.75 mcg Oral Once  . calcitRIOL  2.75 mcg Oral Q T,Th,Sa-HD  . calcium acetate  2,001 mg Oral TID WC  . cinacalcet  60 mg Oral Daily  . colchicine  0.3 mg Oral Once per day on Mon Thu  . darbepoetin (ARANESP) injection - DIALYSIS  60 mcg Intravenous Q Sat-HD  . docusate sodium  100 mg Oral BID  . feeding supplement (RESOURCE BREEZE)  1 Container Oral TID BM  . multivitamin  1 tablet Oral QHS  . nitroGLYCERIN  0.3 mg Transdermal Daily  . pantoprazole  40 mg Oral Daily  . predniSONE  5 mg  Oral Daily  . sodium chloride  3 mL Intravenous Q12H   . sodium chloride Stopped (08/18/14 1430)   sodium chloride, sodium chloride, sodium chloride, acetaminophen **OR** acetaminophen, feeding supplement (NEPRO CARB STEADY), guaiFENesin-dextromethorphan, hydrALAZINE, labetalol, lidocaine (PF), lidocaine-prilocaine, metoprolol, morphine injection, ondansetron, oxyCODONE, pentafluoroprop-tetrafluoroeth, phenol, senna-docusate, sodium chloride

## 2014-08-27 NOTE — Progress Notes (Signed)
    Subjective  -  GIB yesterday requiring transfer to ICU 2U prbc transfused, FFP Smear of blood last night  Physical Exam:  Resting comfortably abd soft Doppler signals R LE       Assessment/Plan:    GIB:  Appreciate assistance of Sr. Stark and CCM.  CT with soft tissue inflammation around sigmoid Stable fro vasc perspective.  Concern over toe on right, no acute intervention required   Hollis Tuller IV, V. WELLS 08/27/2014 6:45 AM --  Filed Vitals:   08/27/14 0630  BP: 114/77  Pulse: 79  Temp:   Resp: 21    Intake/Output Summary (Last 24 hours) at 08/27/14 0645 Last data filed at 08/27/14 0600  Gross per 24 hour  Intake   3567 ml  Output    400 ml  Net   3167 ml     Laboratory CBC    Component Value Date/Time   WBC 11.7* 08/26/2014 2221   HGB 5.8* 08/26/2014 2221   HCT 18.1* 08/26/2014 2221   PLT 190 08/26/2014 2221    BMET    Component Value Date/Time   NA 138 08/26/2014 0840   K 4.1 08/26/2014 0840   CL 96 08/26/2014 0840   CO2 26 08/26/2014 0840   GLUCOSE 98 08/26/2014 0840   BUN 33* 08/26/2014 0840   CREATININE 7.10* 08/26/2014 0840   CREATININE 7.80* 01/16/2014 1437   CALCIUM 8.4 08/26/2014 0840   GFRNONAA 8* 08/26/2014 0840   GFRAA 9* 08/26/2014 0840    COAG Lab Results  Component Value Date   INR 1.68* 08/26/2014   INR 1.18 08/16/2014   INR 1.11 07/20/2014   No results found for: PTT  Antibiotics Anti-infectives    Start     Dose/Rate Route Frequency Ordered Stop   08/18/14 1800  cefUROXime (ZINACEF) 1.5 g in dextrose 5 % 50 mL IVPB  Status:  Discontinued     1.5 g100 mL/hr over 30 Minutes Intravenous Every 12 hours 08/18/14 1631 08/18/14 1643   08/18/14 1800  cefUROXime (ZINACEF) 1.5 g in dextrose 5 % 50 mL IVPB     1.5 g100 mL/hr over 30 Minutes Intravenous Every 24 hours 08/18/14 1643 08/18/14 1804       V. Leia Alf, M.D. [facility] Office: [phone] Pager:  [phone]

## 2014-08-28 ENCOUNTER — Telehealth: Payer: Self-pay

## 2014-08-28 DIAGNOSIS — K922 Gastrointestinal hemorrhage, unspecified: Secondary | ICD-10-CM | POA: Insufficient documentation

## 2014-08-28 DIAGNOSIS — K559 Vascular disorder of intestine, unspecified: Secondary | ICD-10-CM | POA: Insufficient documentation

## 2014-08-28 LAB — PREPARE FRESH FROZEN PLASMA
UNIT DIVISION: 0
UNIT DIVISION: 0
UNIT DIVISION: 0
Unit division: 0

## 2014-08-28 LAB — CBC
HCT: 24.7 % — ABNORMAL LOW (ref 39.0–52.0)
HEMATOCRIT: 30.5 % — AB (ref 39.0–52.0)
HEMATOCRIT: 29.2 % — AB (ref 39.0–52.0)
HEMOGLOBIN: 8.3 g/dL — AB (ref 13.0–17.0)
Hemoglobin: 10.3 g/dL — ABNORMAL LOW (ref 13.0–17.0)
Hemoglobin: 9.7 g/dL — ABNORMAL LOW (ref 13.0–17.0)
MCH: 27.8 pg (ref 26.0–34.0)
MCH: 27.2 pg (ref 26.0–34.0)
MCH: 28.2 pg (ref 26.0–34.0)
MCHC: 33.8 g/dL (ref 30.0–36.0)
MCHC: 33.2 g/dL (ref 30.0–36.0)
MCHC: 33.6 g/dL (ref 30.0–36.0)
MCV: 82.4 fL (ref 78.0–100.0)
MCV: 81.8 fL (ref 78.0–100.0)
MCV: 84 fL (ref 78.0–100.0)
PLATELETS: 253 10*3/uL (ref 150–400)
Platelets: 157 10*3/uL (ref 150–400)
Platelets: 221 10*3/uL (ref 150–400)
RBC: 3.7 MIL/uL — ABNORMAL LOW (ref 4.22–5.81)
RBC: 3.57 MIL/uL — ABNORMAL LOW (ref 4.22–5.81)
RBC: 2.94 MIL/uL — ABNORMAL LOW (ref 4.22–5.81)
RDW: 16.7 % — ABNORMAL HIGH (ref 11.5–15.5)
RDW: 17 % — AB (ref 11.5–15.5)
RDW: 17.3 % — ABNORMAL HIGH (ref 11.5–15.5)
WBC: 11 10*3/uL — ABNORMAL HIGH (ref 4.0–10.5)
WBC: 16.7 10*3/uL — ABNORMAL HIGH (ref 4.0–10.5)
WBC: 20.6 10*3/uL — ABNORMAL HIGH (ref 4.0–10.5)

## 2014-08-28 MED ORDER — SODIUM CHLORIDE 0.9 % IV BOLUS (SEPSIS)
250.0000 mL | Freq: Once | INTRAVENOUS | Status: AC
Start: 2014-08-29 — End: 2014-08-29
  Administered 2014-08-29: 250 mL via INTRAVENOUS

## 2014-08-28 MED ORDER — POLYETHYLENE GLYCOL 3350 17 G PO PACK
17.0000 g | PACK | Freq: Every day | ORAL | Status: DC
  Administered 2014-08-28 – 2014-09-05 (×6): 17 g via ORAL
  Filled 2014-08-28 (×9): qty 1

## 2014-08-28 MED ORDER — DEXTROSE 5 % IV SOLN
30.0000 ug/min | INTRAVENOUS | Status: DC
  Administered 2014-08-29: 40 ug/min via INTRAVENOUS
  Administered 2014-08-29: 45 ug/min via INTRAVENOUS
  Administered 2014-08-29: 10 ug/min via INTRAVENOUS
  Filled 2014-08-28 (×5): qty 1

## 2014-08-28 NOTE — Plan of Care (Signed)
Problem: Phase I Progression Outcomes Goal: Pain controlled with appropriate interventions Outcome: Completed/Met Date Met:  08/28/14 Goal: OOB as tolerated unless otherwise ordered Outcome: Completed/Met Date Met:  08/28/14 Goal: Hemodynamically stable Outcome: Completed/Met Date Met:  08/28/14  Problem: Phase II Progression Outcomes Goal: Hemodynamically stable Outcome: Completed/Met Date Met:  08/28/14

## 2014-08-28 NOTE — Progress Notes (Signed)
LGI bleed patient   - now hypotensive with MAP 52 and sbp 80s  PLAN Stat cbc, inr, trop, bmet Depending on course, might need CVL   Dr. Brand Males, M.D., [facility].C.P Pulmonary and Critical Care Medicine Staff Physician [place] [facility] Pager: [phone], If no answer or between  15:00h - 7:00h: call [phone]  08/28/2014 11:53 PM

## 2014-08-28 NOTE — Progress Notes (Signed)
Patient removed from contact per Infection Control instruction. Richardean Sale, RN

## 2014-08-28 NOTE — Progress Notes (Signed)
Assessment: 1. Acute GI bleed - s/p 4u prbc and 4u FFP. Stable now. Per GI. Possible colonscopy, transfuse prn, holding plavix 2. Ischemic R foot, s/p R fem-pop 11/27 3. Ischemic L foot s/p L popliteal angioplasty x 2 per [facility] (April then October 2015) 4. Left leg edema - resolved, s/p venoplasty / stenting of left CF vein 09/03/14 5. ESRD on HD - no hep HD for now given GIB,TTS-In AM 6. HTN/volume - BP up after blood products, will resume low dose norvasc 2.5 bid. At dry wt.  7. Anemia ^'d aranesp to 60/wk, cont IV Fe 8. Sec HPTH pth 713, cont vit D, sensipar, phoslo 9. Hx CAD s/p PCI/ stents - on NTG patch   Subjective: Interval History: Abd pain improving  Objective: Vital signs in last 24 hours: Temp:  [97.4 F (36.3 C)-98.9 F (37.2 C)] 97.4 F (36.3 C) (12/07 1146) Pulse Rate:  [67-84] 67 (12/07 1200) Resp:  [14-27] 14 (12/07 1200) BP: (85-186)/(30-139) 141/57 mmHg (12/07 1100) SpO2:  [92 %-100 %] 95 % (12/07 1000) Weight change:   Intake/Output from previous day: 12/06 0701 - 12/07 0700 In: 1133 [P.O.:460; I.V.:3; Blood:670] Out: 1 [Stool:1] Intake/Output this shift: Total I/O In: 360 [P.O.:360] Out: -   General appearance: alert and cooperative Chest wall: no tenderness GI: mild tenderness esp LLQ Extremities: post op RLE  Lab Results:  Recent Labs  08/27/14 2020 08/28/14 0341  WBC 12.0* 11.0*  HGB 10.6* 10.3*  HCT 31.2* 30.5*  PLT 138* 157   BMET:  Recent Labs  08/26/14 0840  NA 138  K 4.1  CL 96  CO2 26  GLUCOSE 98  BUN 33*  CREATININE 7.10*  CALCIUM 8.4   No results for input(s): PTH in the last 72 hours. Iron Studies: No results for input(s): IRON, TIBC, TRANSFERRIN, FERRITIN in the last 72 hours. Studies/Results: Ct Abdomen Pelvis W Contrast  08/27/2014   CLINICAL DATA:  Acute onset of bright red blood per rectum, with periumbilical cramping abdominal pain. Initial encounter.  EXAM: CT ABDOMEN AND PELVIS WITH CONTRAST   TECHNIQUE: Multidetector CT imaging of the abdomen and pelvis was performed using the standard protocol following bolus administration of intravenous contrast.  CONTRAST:  161mL OMNIPAQUE IOHEXOL 300 MG/ML  SOLN  COMPARISON:  CT of the abdomen and pelvis from 11/10/2012  FINDINGS: Trace bilateral pleural effusions are seen, with bibasilar atelectasis.  A nonspecific 6 mm hypodensity within the left hepatic lobe is of uncertain significance. The liver is otherwise unremarkable. The spleen is mildly bulky, but remains normal in length. There is persistent chronic gallbladder wall thickening. The gallbladder is difficult to fully assess given trace ascites tracking about the liver.  The pancreas and adrenal glands are unremarkable.  The patient's native kidneys are markedly atrophic, with scattered bilateral renal cysts of varying size and attenuation. Scattered associated calcifications are seen. There is no evidence of hydronephrosis. No obstructing renal stones are seen.  There is marked fatty infiltration with regard to the transplant kidney at the left iliac fossa, with marked thinning of the renal parenchyma. Diffuse associated vascular calcifications are seen. This appearance is stable from 2014.  There is mild diffuse mesenteric and omental edema, nonspecific in appearance.  The stomach is within normal limits. No acute vascular abnormalities are seen. Diffuse calcification is seen along the abdominal aorta and its branches. Extensive diffuse vascular calcification is noted throughout the abdomen and pelvis.  The patient appears to be status post resection of much of the  bowel. Remaining small bowel is grossly unremarkable. A distended anastomosis is noted at the right hemipelvis; this corresponds to the site of the prior right pelvic catheter, which has been removed.  The appendix is not definitely characterized; there is no evidence for appendicitis. Contrast progresses to the level of the proximal sigmoid  colon. There is scattered diverticulosis along the transverse colon. Stool is noted filling the distal sigmoid colon and rectum.  There is soft tissue inflammation about the distal sigmoid colon and rectum, raising question for proctitis. Diffuse presacral stranding is also noted, new from the prior study.  The bladder is not well assessed. Soft tissue density about the expected location of the bladder is relatively stable in appearance. 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 This is perhaps slightly more prominent than on the prior study. No inguinal lymphadenopathy is seen.  No acute osseous abnormalities are identified.  IMPRESSION: 1. Soft tissue inflammation about the distal sigmoid colon and rectum, with diffuse presacral stranding, new from the prior study. This raises concern for proctitis, which may explain the patient's bright red blood per rectum. Stool noted filling the distal sigmoid colon and rectum. 2. Scattered diverticulosis along the transverse colon, without evidence of diverticulitis; the colon is otherwise grossly unremarkable. 3. Trace bilateral pleural effusions, with bibasilar atelectasis. 4. Marked atrophy of the patient's bilateral native kidneys, with bilateral renal cysts and scattered calcifications. Marked fatty infiltration of the transplant kidney at the left iliac fossa, with marked thinning of the renal parenchyma. This appearance is stable from 2014. 5. Stable appearance to soft tissue density about the expected location of the bladder. 6. 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 7. Diffuse calcification along the abdominal aorta and its branches. Extensive diffuse vascular calcification seen throughout the abdomen and pelvis. 8. Nonspecific 6 mm hypodensity  within the left hepatic lobe, new from prior studies. 9. Persistent chronic gallbladder wall thickening; gallbladder difficult to fully assess given trace ascites about the liver, but this appearance is relatively stable. 10. Mild nonspecific diffuse mesenteric and omental edema.   Electronically Signed   By: Garald Balding M.D.   On: 08/27/2014 01:30   Dg Abd Portable 1v  08/26/2014   CLINICAL DATA:  Acute GI bleed  EXAM: PORTABLE ABDOMEN - 1 VIEW  COMPARISON:  CT abdomen pelvis dated 11/10/2012  FINDINGS: Nonspecific but nonobstructive bowel gas pattern.  Surgical clips overlying the pelvis.  Vascular calcifications.  IMPRESSION: Unremarkable abdominal radiograph.   Electronically Signed   By: Julian Hy M.D.   On: 08/26/2014 22:30   Scheduled: . amLODipine  2.5 mg Oral BID  . atorvastatin  40 mg Oral q1800  . calcitRIOL  2.75 mcg Oral Q T,Th,Sa-HD  . calcium acetate  2,001 mg Oral TID WC  . cinacalcet  60 mg Oral Daily  . colchicine  0.3 mg Oral Once per day on Mon Thu  . darbepoetin (ARANESP) injection - DIALYSIS  60 mcg Intravenous Q Sat-HD  . docusate sodium  100 mg Oral BID  . feeding supplement (RESOURCE BREEZE)  1 Container Oral TID BM  . multivitamin  1 tablet Oral QHS  . nitroGLYCERIN  0.3 mg Transdermal Daily  . pantoprazole  40 mg Oral Daily  . polyethylene glycol  17 g Oral Daily  . predniSONE  5 mg  Oral Daily  . sodium chloride  3 mL Intravenous Q12H     LOS: 12 days   Aamari Strawderman C 08/28/2014,12:20 PM

## 2014-08-28 NOTE — Progress Notes (Addendum)
  [facility] Progress Note  08/28/2014 9:36 AM 4 Days Post-Op  Subjective:  Had formed bowel movement with small amount of blood around it per patient last night. Tolerating clear liquids. Minimal abdominal pain.   Tmax 98.9 BP sys 90s-180s 02 93% RA  Filed Vitals:   08/28/14 0728  BP:   Pulse:   Temp: 98.8 F (37.1 C)  Resp:     Physical Exam: General: thin male sitting in chair in NAD Incisions:  Left leg incisions healing well.  Extremities:  Duskiness of right great toe, 2nd and 3rd toes. Malodorous drainage of left great toe with dry gangrene. DP doppler signals bilaterally Abdomen: soft, normoactive bowel sounds, very mild tenderness to LLQ  CBC    Component Value Date/Time   WBC 11.0* 08/28/2014 0341   RBC 3.70* 08/28/2014 0341   HGB 10.3* 08/28/2014 0341   HCT 30.5* 08/28/2014 0341   PLT 157 08/28/2014 0341   MCV 82.4 08/28/2014 0341   MCH 27.8 08/28/2014 0341   MCHC 33.8 08/28/2014 0341   RDW 16.7* 08/28/2014 0341   LYMPHSABS 1.1 03/23/2013 1136   MONOABS 0.8 03/23/2013 1136   EOSABS 0.1 03/23/2013 1136   BASOSABS 0.0 03/23/2013 1136    BMET    Component Value Date/Time   NA 138 08/26/2014 0840   K 4.1 08/26/2014 0840   CL 96 08/26/2014 0840   CO2 26 08/26/2014 0840   GLUCOSE 98 08/26/2014 0840   BUN 33* 08/26/2014 0840   CREATININE 7.10* 08/26/2014 0840   CREATININE 7.80* 01/16/2014 1437   CALCIUM 8.4 08/26/2014 0840   GFRNONAA 8* 08/26/2014 0840   GFRAA 9* 08/26/2014 0840    INR    Component Value Date/Time   INR 1.23 08/27/2014 1254     Intake/Output Summary (Last 24 hours) at 08/28/14 0936 Last data filed at 08/27/14 2100  Gross per 24 hour  Intake   1133 ml  Output      1 ml  Net   1132 ml     Assessment:  53 y.o. male is s/p: Right femoral to below knee popliteal artery bypass graft with 6 mm external ring propatent PTFE 10 Days Post-Op  Left thigh shuntogram with venoplasty and stenting of left common  femoral vain.  4 Days Post-Op  Plan: -Possible Ischemic colitis: Hemodynamically stable. Hgb at 10.3. Per GI, will likely not do colonoscopy but sig flex. Diet per GI. -Bilateral doppler signals. Progressive duskiness of right 1st-3rd toes and malodorous drainage of left great toe wound. May require further surgical intervention. Will wait for GI issues to continue to improve.  -Continue PT -RCA DES x 3 Will continue to hold plavix and aspirin. Consulted cardiology about when to resume.  -Dispo: Stable for transfer to [place], PA-C [facility] Office: [phone] Pager: [phone] 08/28/2014 9:36 AM    I agree with the above. Appreciate medical assistance.  Continue close observation of toes on the right foot.   [name]

## 2014-08-28 NOTE — Telephone Encounter (Signed)
-----   Message from Ladene Artist, MD sent at 08/26/2014  7:26 PM EST ----- Please cancel all future recall procedures due to comorbidities

## 2014-08-28 NOTE — Progress Notes (Signed)
PULMONARY / CRITICAL CARE MEDICINE   Name: [name] MRN: [id number] DOB: December 13, 1960    ADMISSION DATE:  08/16/2014 CONSULTATION DATE:  08/28/2014  REFERRING MD :  Trula Slade  CHIEF COMPLAINT:  Bright red blood per rectum  INITIAL PRESENTATION:  53 yo male with hx of diverticulosis transferred to ICU with lower GI bleeding.  He has hx of PVD s/p Rt fem-pop bypass 11/25 and venoplasty Lt femoral vein 12/03, CAD s/p DES April 2015 on ASA and plavix.  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  STUDIES:  12/02 LT leg doppler >> no DVT 12/06 CT abd >> soft tissue inflammation at distal sigmoid/rectum  SIGNIFICANT EVENTS: 12/05 LGI bleed, to ICU, GI consulted   SUBJECTIVE:  C/o bloating after eating.  VITAL SIGNS: Temp:  [97.4 F (36.3 C)-98.9 F (37.2 C)] 98.8 F (37.1 C) (12/07 0728) Pulse Rate:  [67-84] 79 (12/07 0700) Resp:  [14-27] 19 (12/07 0700) BP: (94-186)/(30-93) 159/30 mmHg (12/07 0700) SpO2:  [92 %-100 %] 93 % (12/07 0700) INTAKE / OUTPUT:  Intake/Output Summary (Last 24 hours) at 08/28/14 1011 Last data filed at 08/27/14 2100  Gross per 24 hour  Intake   1013 ml  Output      1 ml  Net   1012 ml    PHYSICAL EXAMINATION: General: no distress Neuro: follows commands HEENT: no sinus tenderness Cardiovascular: regular Lungs: no wheeze Abdomen: soft, non tender Musculoskeletal: no edema, decreased muscle bulk Skin:  Dry gangrene right toes  LABS:  CBC  Recent Labs Lab 08/27/14 1254 08/27/14 2020 08/28/14 0341  WBC 7.1 12.0* 11.0*  HGB 8.3* 10.6* 10.3*  HCT 24.5* 31.2* 30.5*  PLT 121* 138* 157   Coag's  Recent Labs Lab 08/26/14 1730 08/27/14 1254  APTT 42* 37  INR 1.68* 1.23   BMET  Recent Labs Lab 08/22/14 1145 08/24/14 1700 08/26/14 0840  NA 135* 136* 138  K 4.8 5.0 4.1  CL 91* 93* 96  CO2 24 19 26   BUN 68* 54* 33*  CREATININE 10.49* 9.22*  9.22* 7.10*  GLUCOSE 115* 63* 98   Electrolytes  Recent Labs Lab 08/22/14 1145  08/24/14 1700 08/26/14 0840  CALCIUM 8.5 8.6 8.4  PHOS 5.3* 5.4* 4.5   Liver Enzymes  Recent Labs Lab 08/22/14 1145 08/24/14 1700 08/26/14 0840  ALBUMIN 2.0* 1.9* 1.8*    Imaging Ct Abdomen Pelvis W Contrast  08/27/2014   CLINICAL DATA:  Acute onset of bright red blood per rectum, with periumbilical cramping abdominal pain. Initial encounter.  EXAM: CT ABDOMEN AND PELVIS WITH CONTRAST  TECHNIQUE: Multidetector CT imaging of the abdomen and pelvis was performed using the standard protocol following bolus administration of intravenous contrast.  CONTRAST:  132mL OMNIPAQUE IOHEXOL 300 MG/ML  SOLN  COMPARISON:  CT of the abdomen and pelvis from 11/10/2012  FINDINGS: Trace bilateral pleural effusions are seen, with bibasilar atelectasis.  A nonspecific 6 mm hypodensity within the left hepatic lobe is of uncertain significance. The liver is otherwise unremarkable. The spleen is mildly bulky, but remains normal in length. There is persistent chronic gallbladder wall thickening. The gallbladder is difficult to fully assess given trace ascites tracking about the liver.  The pancreas and adrenal glands are unremarkable.  The patient's native kidneys are markedly atrophic, with scattered bilateral renal cysts of varying size and attenuation. Scattered associated calcifications are seen. There is no evidence of hydronephrosis. No obstructing renal stones are seen.  There is marked fatty infiltration with regard to  the transplant kidney at the left iliac fossa, with marked thinning of the renal parenchyma. Diffuse associated vascular calcifications are seen. This appearance is stable from 2014.  There is mild diffuse mesenteric and omental edema, nonspecific in appearance.  The stomach is within normal limits. No acute vascular abnormalities are seen. Diffuse calcification is seen along the abdominal aorta and its branches. Extensive diffuse vascular calcification is noted throughout the abdomen and pelvis.   The patient appears to be status post resection of much of the bowel. Remaining small bowel is grossly unremarkable. A distended anastomosis is noted at the right hemipelvis; this corresponds to the site of the prior right pelvic catheter, which has been removed.  The appendix is not definitely characterized; there is no evidence for appendicitis. Contrast progresses to the level of the proximal sigmoid colon. There is scattered diverticulosis along the transverse colon. Stool is noted filling the distal sigmoid colon and rectum.  There is soft tissue inflammation about the distal sigmoid colon and rectum, raising question for proctitis. Diffuse presacral stranding is also noted, new from the prior study.  The bladder is not well assessed. Soft tissue density about the expected location of the bladder is relatively stable in appearance. 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 This is perhaps slightly more prominent than on the prior study. No inguinal lymphadenopathy is seen.  No acute osseous abnormalities are identified.  IMPRESSION: 1. Soft tissue inflammation about the distal sigmoid colon and rectum, with diffuse presacral stranding, new from the prior study. This raises concern for proctitis, which may explain the patient's bright red blood per rectum. Stool noted filling the distal sigmoid colon and rectum. 2. Scattered diverticulosis along the transverse colon, without evidence of diverticulitis; the colon is otherwise grossly unremarkable. 3. Trace bilateral pleural effusions, with bibasilar atelectasis. 4. Marked atrophy of the patient's bilateral native kidneys, with bilateral renal cysts and scattered calcifications. Marked fatty infiltration of the transplant kidney at the left iliac fossa, with marked thinning of the renal parenchyma. This appearance is stable from 2014. 5. Stable appearance to soft tissue  density about the expected location of the bladder. 6. 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 7. Diffuse calcification along the abdominal aorta and its branches. Extensive diffuse vascular calcification seen throughout the abdomen and pelvis. 8. Nonspecific 6 mm hypodensity within the left hepatic lobe, new from prior studies. 9. Persistent chronic gallbladder wall thickening; gallbladder difficult to fully assess given trace ascites about the liver, but this appearance is relatively stable. 10. Mild nonspecific diffuse mesenteric and omental edema.   Electronically Signed   By: Garald Balding M.D.   On: 08/27/2014 01:30     ASSESSMENT / PLAN:  LGI bleed possibly from ischemic colitis in setting of diverticulosis. Plan: F/u with GI  Severe protein calorie malnutrition. Plan: Advance diet per primary team and GI  Hx of ESRD on HD. Plan: Per renal  Hx of Gout on chronic prednisone. Plan: Defer to primary team  Hx of CAD s/p DES in April 2015. PVD. Hx of HTN, HLD. Plan: Will need to determine when to resume ASA, plavix >> might need cardiology input for this Post op care per [facility]  Agree with plan to transfer out of ICU.  PCCM will sign off.  Please call if additional help needed.  Chesley Mires, MD [facility] Pulmonary/Critical Care 08/28/2014, 10:26 AM Pager:  [phone] After 3pm call: [phone]

## 2014-08-28 NOTE — Progress Notes (Signed)
Patient continues to complain of abdomen cramping, sudden loss of control of bowels. Large flank red bloody stools and passing moderately to large sized clots. CCM notified, CBC ordered. Paged Dr. Henrene Pastor with [facility] to inform of patient status. No new orders received. Will continue to monitor.

## 2014-08-28 NOTE — Progress Notes (Signed)
Daily Rounding Note  08/28/2014, 9:31 AM  LOS: 12 days   SUBJECTIVE:       Dark brown/black formed stool with some blood this AM.  Pt feels bloated and "tight".  Afraid to advance to solid foods as he fears this will back up on him.  No abdominal pain.  No nausea. No emesis  OBJECTIVE:         Vital signs in last 24 hours:    Temp:  [97.4 F (36.3 C)-98.9 F (37.2 C)] 98.8 F (37.1 C) (12/07 0728) Pulse Rate:  [67-84] 79 (12/07 0700) Resp:  [14-27] 19 (12/07 0700) BP: (94-186)/(30-93) 159/30 mmHg (12/07 0700) SpO2:  [92 %-100 %] 93 % (12/07 0700) Last BM Date: 08/27/14 Filed Weights   08/24/14 1915 08/26/14 0830 08/26/14 1201  Weight: 108 lb 3.9 oz (49.1 kg) 102 lb 15.3 oz (46.7 kg) 101 lb 13.6 oz (46.2 kg)   General: looks better but still chronically ill.  Sitting up in chair   Heart: RRR Chest: clear bil.  No labored breathing Abdomen: BS hypoactive, not distended or protuberant.  Not tender, just subjectively feels tight on the left..   Extremities: No CCE.  Thin limbs.   Neuro/Psych:  Oriented x 3.  Appropriate, alert and engaged.   Intake/Output from previous day: 12/06 0701 - 12/07 0700 In: 1133 [P.O.:460; I.V.:3; Blood:670] Out: 1 [Stool:1]  Intake/Output this shift:    Lab Results:  Recent Labs  08/27/14 1254 08/27/14 2020 08/28/14 0341  WBC 7.1 12.0* 11.0*  HGB 8.3* 10.6* 10.3*  HCT 24.5* 31.2* 30.5*  PLT 121* 138* 157   BMET  Recent Labs  08/26/14 0840  NA 138  K 4.1  CL 96  CO2 26  GLUCOSE 98  BUN 33*  CREATININE 7.10*  CALCIUM 8.4   LFT  Recent Labs  08/26/14 0840  ALBUMIN 1.8*   PT/INR  Recent Labs  08/26/14 1730 08/27/14 1254  LABPROT 20.0* 15.6*  INR 1.68* 1.23   Hepatitis Panel No results for input(s): HEPBSAG, HCVAB, HEPAIGM, HEPBIGM in the last 72 hours.  Studies/Results: Ct Abdomen Pelvis W Contrast  08/27/2014   CLINICAL DATA:  Acute onset of bright  red blood per rectum, with periumbilical cramping abdominal pain. Initial encounter.  EXAM: CT ABDOMEN AND PELVIS WITH CONTRAST  TECHNIQUE: Multidetector CT imaging of the abdomen and pelvis was performed using the standard protocol following bolus administration of intravenous contrast.  CONTRAST:  16mL OMNIPAQUE IOHEXOL 300 MG/ML  SOLN  COMPARISON:  CT of the abdomen and pelvis from 11/10/2012  FINDINGS: Trace bilateral pleural effusions are seen, with bibasilar atelectasis.  A nonspecific 6 mm hypodensity within the left hepatic lobe is of uncertain significance. The liver is otherwise unremarkable. The spleen is mildly bulky, but remains normal in length. There is persistent chronic gallbladder wall thickening. The gallbladder is difficult to fully assess given trace ascites tracking about the liver.  The pancreas and adrenal glands are unremarkable.  The patient's native kidneys are markedly atrophic, with scattered bilateral renal cysts of varying size and attenuation. Scattered associated calcifications are seen. There is no evidence of hydronephrosis. No obstructing renal stones are seen.  There is marked fatty infiltration with regard to the transplant kidney at the left iliac fossa, with marked thinning of the renal parenchyma. Diffuse associated vascular calcifications are seen. This appearance is stable from 2014.  There is mild diffuse mesenteric and omental edema, nonspecific in appearance.  The stomach is within normal limits. No acute vascular abnormalities are seen. Diffuse calcification is seen along the abdominal aorta and its branches. Extensive diffuse vascular calcification is noted throughout the abdomen and pelvis.  The patient appears to be status post resection of much of the bowel. Remaining small bowel is grossly unremarkable. A distended anastomosis is noted at the right hemipelvis; this corresponds to the site of the prior right pelvic catheter, which has been removed.  The appendix is  not definitely characterized; there is no evidence for appendicitis. Contrast progresses to the level of the proximal sigmoid colon. There is scattered diverticulosis along the transverse colon. Stool is noted filling the distal sigmoid colon and rectum.  There is soft tissue inflammation about the distal sigmoid colon and rectum, raising question for proctitis. Diffuse presacral stranding is also noted, new from the prior study.  The bladder is not well assessed. Soft tissue density about the expected location of the bladder is relatively stable in appearance. 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 This is perhaps slightly more prominent than on the prior study. No inguinal lymphadenopathy is seen.  No acute osseous abnormalities are identified.  IMPRESSION: 1. Soft tissue inflammation about the distal sigmoid colon and rectum, with diffuse presacral stranding, new from the prior study. This raises concern for proctitis, which may explain the patient's bright red blood per rectum. Stool noted filling the distal sigmoid colon and rectum. 2. Scattered diverticulosis along the transverse colon, without evidence of diverticulitis; the colon is otherwise grossly unremarkable. 3. Trace bilateral pleural effusions, with bibasilar atelectasis. 4. Marked atrophy of the patient's bilateral native kidneys, with bilateral renal cysts and scattered calcifications. Marked fatty infiltration of the transplant kidney at the left iliac fossa, with marked thinning of the renal parenchyma. This appearance is stable from 2014. 5. Stable appearance to soft tissue density about the expected location of the bladder. 6. 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 7. Diffuse calcification along the abdominal aorta and its  branches. Extensive diffuse vascular calcification seen throughout the abdomen and pelvis. 8. Nonspecific 6 mm hypodensity within the left hepatic lobe, new from prior studies. 9. Persistent chronic gallbladder wall thickening; gallbladder difficult to fully assess given trace ascites about the liver, but this appearance is relatively stable. 10. Mild nonspecific diffuse mesenteric and omental edema.   Electronically Signed   By: Garald Balding M.D.   On: 08/27/2014 01:30   Dg Abd Portable 1v  08/26/2014   CLINICAL DATA:  Acute GI bleed  EXAM: PORTABLE ABDOMEN - 1 VIEW  COMPARISON:  CT abdomen pelvis dated 11/10/2012  FINDINGS: Nonspecific but nonobstructive bowel gas pattern.  Surgical clips overlying the pelvis.  Vascular calcifications.  IMPRESSION: Unremarkable abdominal radiograph.   Electronically Signed   By: Julian Hy M.D.   On: 08/26/2014 22:30   Scheduled Meds: . sodium chloride   Intravenous Once  . sodium chloride   Intravenous Once  . sodium chloride   Intravenous Once  . amLODipine  2.5 mg Oral BID  . atorvastatin  40 mg Oral q1800  . bisacodyl  10 mg Rectal Once  . calcitRIOL  2.75 mcg Oral Once  . calcitRIOL  2.75 mcg Oral Q T,Th,Sa-HD  . calcium acetate  2,001 mg Oral TID WC  . cinacalcet  60 mg  Oral Daily  . colchicine  0.3 mg Oral Once per day on Mon Thu  . darbepoetin (ARANESP) injection - DIALYSIS  60 mcg Intravenous Q Sat-HD  . docusate sodium  100 mg Oral BID  . feeding supplement (RESOURCE BREEZE)  1 Container Oral TID BM  . multivitamin  1 tablet Oral QHS  . nitroGLYCERIN  0.3 mg Transdermal Daily  . pantoprazole  40 mg Oral Daily  . polyethylene glycol  17 g Oral Daily  . predniSONE  5 mg Oral Daily  . sodium chloride  3 mL Intravenous Q12H   Continuous Infusions: . sodium chloride Stopped (08/18/14 1430)   PRN Meds:.sodium chloride, sodium chloride, sodium chloride, acetaminophen **OR** acetaminophen, feeding supplement (NEPRO CARB STEADY),  guaiFENesin-dextromethorphan, hydrALAZINE, labetalol, lidocaine (PF), lidocaine-prilocaine, metoprolol, morphine injection, ondansetron, oxyCODONE, pentafluoroprop-tetrafluoroeth, phenol, senna-docusate, sodium chloride  ASSESMENT:    * Hematochezia, no acute abdominal pain. CT scan most c/w ischemic colitis.  Constipated leading up to hematochezia.   * Chronic Plavix. On hold.   * ABL on top of chronic anemia.  On aranesp.  S/p 7 PRBCs, 4 between 12/6-12/7. Hgb improved.   * Coagulopathy. S/p Vitamin K and FFP x 4 units.  coags now normal. Not on Coumadin pta. .   * ESRD. On HD.   * S/p 2 previous SB resections. S/p segmental colectomy for perforated diverticulits, colostomy ultimately reversed.   * ASPVD. Fem pop bypass 08/18/14. Extensive vascular disease noted on CT. Plavix discontinued 12/5 (date of his last dose).  On Plavix and low dose ASA PTA.     * Protein malnutrition.     PLAN   *  Advance from clears to full liquids.  Add daily Miralax. No plans to colonoscope or flex sig unless unresolving BPR or new acute anemia.   *  ? When will be safe to restart Plavix?     [name]  08/28/2014, 9:31 AM Pager: [phone]  GI ATTENDING  Interval history and data reviewed. Case discussed in morning report with GI colleagues. Agree with interval progress note as outlined above. Patient has ischemic colitis post peripheral bypass surgery. Doing better today. Continue with ongoing supportive care. Expect continued improvement from GI standpoint. We'll follow-up tomorrow  Docia Chuck. Geri Seminole., M.D. [facility] Division of Gastroenterology

## 2014-08-28 NOTE — Telephone Encounter (Signed)
Recall cancelled.  

## 2014-08-28 NOTE — Progress Notes (Signed)
Physical Therapy Treatment Patient Details Name: [name] MRN: [id number] DOB: 1960-12-18 Today's Date: 08/28/2014    History of Present Illness RAINER MOUNCE is a 53 y.o. AA male with a history of peripheral vascular disease, CAD s/p PCI in 12/2013,  CHF, Hepatitis C, SBO and ESRD. He had angioplasty of the right popliteal artery per Dr. Trula Slade on 11/11, but returned to the ED 11/22 with worsening right foot pain and was found to have significant plaque with reduced flows throughout the RLE. 11/25 Pt was unable to stand to attend dialysis secondary to severe right foot pain and was admitted for management.  S/p R fem-pop BPG 11/27    PT Comments    Patient is gaining more motivation and is working harder now to regain PLOF.  He is more receptive to education and feedback throughout session.  Educated on use of Darco shoe and importance of frequent exercise to regain comfortable ROM in RLE.  Will continue to follow to improve functional mobility and decrease pain.   Follow Up Recommendations  SNF;Home health PT     Equipment Recommendations  3in1 (PT);Rolling walker with 5" wheels    Recommendations for Other Services       Precautions / Restrictions Precautions Precautions: Fall Restrictions Weight Bearing Restrictions: No    Mobility  Bed Mobility Overal bed mobility: Needs Assistance Bed Mobility: Supine to Sit     Supine to sit: Supervision     General bed mobility comments: Pt initially asked for help to move from supine to sit but was able to do so without physical assist after HOB was elevated.  Pt donned socks and Darco shoe while sitting in bed.  Transfers Overall transfer level: Needs assistance Equipment used: Rolling walker (2 wheeled) Transfers: Sit to/from Stand (x3) Sit to Stand: Min guard         General transfer comment: Min guard for patient safety and for comfort with new equipment (Darco shoe).  Verbal cues for scooting and to control  descent to seated position. Stood from chair once without RW with hand-hold assist once standing to maintain balance.  Ambulation/Gait Ambulation/Gait assistance: Min guard Ambulation Distance (Feet): 48 Feet (31ft then 20ft) Assistive device: Rolling walker (2 wheeled) Gait Pattern/deviations: Step-to pattern;Decreased stance time - right;Decreased weight shift to right;Antalgic   Gait velocity interpretation: Below normal speed for age/gender General Gait Details: Pt working to gain comfort ambulating with Darco shoe today; max verbal cues to put weight through right heel when walking and avoid placing forefoot on the ground. Mod verbal cues also for posture, RW position, and sequencing.   Stairs            Wheelchair Mobility    Modified Rankin (Stroke Patients Only)       Balance Overall balance assessment: Needs assistance Sitting-balance support: No upper extremity supported;Feet supported Sitting balance-Leahy Scale: Normal     Standing balance support: Single extremity supported Standing balance-Leahy Scale: Fair                      Cognition Arousal/Alertness: Awake/alert Behavior During Therapy: WFL for tasks assessed/performed Overall Cognitive Status: Within Functional Limits for tasks assessed                      Exercises General Exercises - Lower Extremity Quad Sets: AROM;20 reps;Right;Seated (long sitting)    General Comments        Pertinent Vitals/Pain Pain Assessment: Faces Faces Pain  Scale: Hurts a little bit Pain Location: RLE Pain Intervention(s): Limited activity within patient's tolerance;Monitored during session    Home Living                      Prior Function            PT Goals (current goals can now be found in the care plan section) Progress towards PT goals: Progressing toward goals    Frequency  Min 3X/week    PT Plan Current plan remains appropriate    Co-evaluation              End of Session Equipment Utilized During Treatment: Gait belt Activity Tolerance: Patient tolerated treatment well Patient left: in chair;with call bell/phone within reach     Time: 0833-0906 PT Time Calculation (min) (ACUTE ONLY): 33 min  Charges:                       G Codes:      Jessye Imhoff SPT 08/28/2014, 9:50 AM

## 2014-08-28 NOTE — Progress Notes (Signed)
eLink Physician-Brief Progress Note Patient Name: [name] DOB: 1961-02-11 MRN: [id number]   Date of Service  08/28/2014  HPI/Events of Note  Passing blood clots PR Presumed ischemic colitis/proctitis plavix held  eICU Interventions  CBC stat Inform GI, who is following     Intervention Category Intermediate Interventions: Bleeding - evaluation and treatment with blood products  ALVA,RAKESH V. 08/28/2014, 6:15 PM

## 2014-08-28 NOTE — Progress Notes (Signed)
Utilization Review Completed.  

## 2014-08-28 NOTE — Consult Note (Addendum)
Referring Provider: Dr. Trula Slade Primary Care Physician:  Placido Sou, MD   Reason for Consultation:  General Medical Management and [facility] asked to take over as primary  HPI: [name] is a 53 y.o. 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  He is s/p Rt fem-pop bypass 11/27 and Left popliteal angioplasty x 2 (April and October 15) and venoplasty and stenting of left common femoral vein 08/24/14. He was stable on the vascular service, however on 12/5 he began having abdominal pain with bright red blood per rectum on aspirin and plavix.  He was transferred to ICU at that time for lower GI bleed.  He was seen by [facility] who believes his symptoms and CT scan are most consistent with Ischemic colitis. They do not plan any endoscopic procedures at this point in his hospitalization.  His last BRBPR was the evening of 12/6. There was no stool - simply bright red blood.  He has received a total of 7 units of PRBCs and 4 units of FFP.  His abdominal pain has improved at this point and he is tolerating a full liquid diet.  His plavix and aspirin have not been restarted yet. [name] reports having bowel problems for several years.  He is only able to eat or drink very small amounts at one time after which he purposefully burps or passes gas to settle his stomach.  He states is he normally constipated.    Today patient feels well, endorses right foot pain and mild abdominal pain, denies any nausea or vomiting. Denies any chest pain.   Past Medical History  Diagnosis Date  . ESRD on hemodialysis     a. ESRD 2/2 HTN with renal transplant in 1987 (cadaveric) after short period of dialysis;  b. Transplant failed in 2004 and he went back on HD;  c. As of 10/15 getting HD via L thigh AVG on a TTS schedule at [facility] on [facility].  . Hypertension   . Hx of kidney transplant     a. 1987-> back on  HD since 2004  . Gout tophi   . Chronic steroid use     a. Has severe gout. Did not tolerate Allopurinol. Do not taper per PCP   . Systolic CHF, chronic     2 D echo 04/2012 with EF of 45 %   . Malnutrition   . Hx SBO 04/2012    a. 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 F/U with Dr Donne Hazel. Started on TNA  . Anemia associated with chronic renal failure   . Hepatitis C   . GERD (gastroesophageal reflux disease)   . H/O hiatal hernia   . Arthritis   . History of DVT (deep vein thrombosis)   . Peripheral vascular disease     a. 12/2013 PTA of L Pop;  b. 12/2013 PTA R pop, R DP;  c. 06/2014 L Pop CBA/DCB PTA.  . Prostate cancer   . CAD (coronary artery disease)     a. 12/2013 Cath/PCI: EF 45-50%, LM Ca2+, LAD 30-40p, 68m, D1/2/3 min irregs, LCX 50ost, 30-33m, RCA 70p, 95/18m (Rota->3.0x23 Xience distal, 3.0x23 Xience mid, 3.0x28 Xience prox).  . Chronic diastolic CHF (congestive heart failure)     a. 12/2013 Echo: EF 55-60%, mild LVH, nl wall motion, Gr  2 DD.  . Dry gangrene     a. L great toe    Past Surgical History  Procedure Laterality Date  . Laparotomy  04/14/2012    Procedure: EXPLORATORY LAPAROTOMY;  Surgeon: Joyice Faster. Cornett, MD;  Location: [place];  Service: General;  Laterality: N/A;  . Colon resection  04/14/2012  . Laparotomy  04/22/2012    Procedure: EXPLORATORY LAPAROTOMY;  Surgeon: Adin Hector, MD;  Location: [place];  Service: General;  Laterality: N/A;  lysis of adhesions, omentoplasty, repair small bowel  . Thrombectomy w/ embolectomy  04/27/2012    Procedure: THROMBECTOMY ARTERIOVENOUS GORE-TEX GRAFT;  Surgeon: Angelia Mould, MD;  Location: [place];  Service: Vascular;  Laterality: Left;  Thrombectomy of left thigh arteriovenous gortex graft  . Prostectomy  2011  . Renal grafts    . Pelvic abcess drainage Right 6/14    removal drain s/p bowl resection 13  . Transanal excision of rectal  mass N/A 03/30/2013    Procedure: EXCISION OF anal MASS;  Surgeon: Joyice Faster. Cornett, MD;  Location: [place];  Service: General;  Laterality: N/A;  Exam under anesthesia with excision anal verge mass  . Aortogram  08/15/2014    abd aortogram  . Femoral-popliteal bypass graft Right 08/18/2014    Procedure: BYPASS GRAFT FEMORAL-POPLITEAL ARTERY with Gortex Graft;  Surgeon: Serafina Mitchell, MD;  Location: [place];  Service: Vascular;  Laterality: Right;    Prior to Admission medications   Medication Sig Start Date End Date Taking? Authorizing Provider  amLODipine (NORVASC) 5 MG tablet Take 5 mg by mouth daily.  07/26/14  Yes Historical Provider, MD  aspirin EC 81 MG tablet Take 81 mg by mouth daily.   Yes Historical Provider, MD  atorvastatin (LIPITOR) 40 MG tablet TAKE 1 TABLET BY MOUTH DAILY AT 6 PM 08/14/14  Yes Troy Sine, MD  calcium acetate (PHOSLO) 667 MG tablet Take 2,001 mg by mouth 3 (three) times daily.  01/24/13  Yes Historical Provider, MD  carvedilol (COREG) 6.25 MG tablet TAKE 1 TABLET BY MOUTH TWICE DAILY WITH MEALS 08/14/14  Yes Troy Sine, MD  cinacalcet (SENSIPAR) 60 MG tablet Take 60 mg by mouth daily.   Yes Historical Provider, MD  clopidogrel (PLAVIX) 75 MG tablet Take 1 tablet (75 mg total) by mouth daily with breakfast. 01/14/14  Yes Lendon Colonel, NP  COLCRYS 0.6 MG tablet Take 0.6 mg by mouth daily.  01/04/13  Yes Historical Provider, MD  HYDROmorphone (DILAUDID) 2 MG tablet Take 1 to 2 tablets by mouth every 6 hours as needed for postoperative pain. Patient taking differently: Take 1-2 mg by mouth every 6 (six) hours as needed (pain).  08/07/14  Yes Serafina Mitchell, MD  LIDOCAINE EX Apply 1 application topically See admin instructions. Apply to inject site on dialysis days (Tues, Thur, Sat) if needed for pain   Yes Historical Provider, MD  loperamide (IMODIUM A-D) 2 MG tablet Take 2 mg by mouth daily as needed for diarrhea or loose stools.  08/13/12  Yes Erroll Luna,  MD  nitroGLYCERIN (NITRODUR - DOSED IN MG/24 HR) 0.3 mg/hr patch Place 1 patch (0.3 mg total) onto the skin daily. 01/14/14  Yes Lendon Colonel, NP  oxyCODONE (ROXICODONE) 5 MG immediate release tablet Take 1 tablet (5 mg total) by mouth every 4 (four) hours as needed for severe pain. 08/02/14  Yes Serafina Mitchell, MD  predniSONE (DELTASONE) 5 MG tablet Take 5 mg by mouth  daily.   Yes Historical Provider, MD  multivitamin (RENA-VIT) TABS tablet Take 1 tablet by mouth daily. 07/05/12   Rosalia Hammers, MD  pantoprazole (PROTONIX) 40 MG tablet TAKE 1 TABLET BY MOUTH DAILY 08/26/14   Troy Sine, MD    Current Facility-Administered Medications  Medication Dose Route Frequency Provider Last Rate Last Dose  . 0.9 %  sodium chloride infusion   Intravenous Continuous Alvia Grove, PA-C   Stopped at 08/18/14 1430  . 0.9 %  sodium chloride infusion  100 mL Intravenous PRN Sol Blazing, MD      . 0.9 %  sodium chloride infusion  100 mL Intravenous PRN Sol Blazing, MD      . 0.9 %  sodium chloride infusion  250 mL Intravenous PRN Conrad Gilliam, MD      . acetaminophen (TYLENOL) tablet 325-650 mg  325-650 mg Oral Q4H PRN Alvia Grove, PA-C   325 mg at 08/20/14 1239   Or  . acetaminophen (TYLENOL) suppository 325-650 mg  325-650 mg Rectal Q4H PRN Alvia Grove, PA-C      . amLODipine (NORVASC) tablet 2.5 mg  2.5 mg Oral BID Sol Blazing, MD   2.5 mg at 08/28/14 4696  . atorvastatin (LIPITOR) tablet 40 mg  40 mg Oral q1800 Alvia Grove, PA-C   40 mg at 08/27/14 1616  . calcitRIOL (ROCALTROL) capsule 2.75 mcg  2.75 mcg Oral Q T,Th,Sa-HD Ramiro Harvest, PA-C   2.75 mcg at 08/26/14 1134  . calcium acetate (PHOSLO) capsule 2,001 mg  2,001 mg Oral TID WC Alvia Grove, PA-C   2,001 mg at 08/28/14 0800  . cinacalcet (SENSIPAR) tablet 60 mg  60 mg Oral Daily Alvia Grove, PA-C   60 mg at 08/28/14 2952  . colchicine tablet 0.3 mg  0.3 mg Oral Once per day on Mon Thu Conrad ,  MD   0.3 mg at 08/28/14 8413  . Darbepoetin Alfa (ARANESP) injection 60 mcg  60 mcg Intravenous Q Sat-HD Jamal Maes, MD   60 mcg at 08/26/14 1134  . docusate sodium (COLACE) capsule 100 mg  100 mg Oral BID Alvia Grove, PA-C   100 mg at 08/28/14 2440  . feeding supplement (NEPRO CARB STEADY) liquid 237 mL  237 mL Oral PRN Sol Blazing, MD      . feeding supplement (RESOURCE BREEZE) (RESOURCE BREEZE) liquid 1 Container  1 Container Oral TID BM Rogue Bussing, RD   1 Container at 08/27/14 1603  . guaiFENesin-dextromethorphan (ROBITUSSIN DM) 100-10 MG/5ML syrup 15 mL  15 mL Oral Q4H PRN Alvia Grove, PA-C      . hydrALAZINE (APRESOLINE) injection 5 mg  5 mg Intravenous Q20 Min PRN Alvia Grove, PA-C      . labetalol (NORMODYNE,TRANDATE) injection 10 mg  10 mg Intravenous Q10 min PRN Alvia Grove, PA-C      . lidocaine (PF) (XYLOCAINE) 1 % injection 5 mL  5 mL Intradermal PRN Sol Blazing, MD      . lidocaine-prilocaine (EMLA) cream 1 application  1 application Topical PRN Sol Blazing, MD      . metoprolol (LOPRESSOR) injection 2-5 mg  2-5 mg Intravenous Q2H PRN Samantha J Rhyne, PA-C      . morphine 2 MG/ML injection 2-3 mg  2-3 mg Intravenous Q2H PRN Hulen Shouts Rhyne, PA-C   2 mg at 08/28/14 0958  . multivitamin (RENA-VIT) tablet 1 tablet  1 tablet Oral QHS Alvia Grove, PA-C   1 tablet at 08/27/14 2222  . nitroGLYCERIN (NITRODUR - Dosed in mg/24 hr) patch 0.3 mg  0.3 mg Transdermal Daily Alvia Grove, PA-C   0.3 mg at 08/28/14 0924  . ondansetron (ZOFRAN) injection 4 mg  4 mg Intravenous Q6H PRN Alvia Grove, PA-C   4 mg at 08/27/14 0348  . oxyCODONE (Oxy IR/ROXICODONE) immediate release tablet 5 mg  5 mg Oral Q4H PRN Alvia Grove, PA-C   5 mg at 08/28/14 1131  . pantoprazole (PROTONIX) EC tablet 40 mg  40 mg Oral Daily Alvia Grove, PA-C   40 mg at 08/28/14 3614  . pentafluoroprop-tetrafluoroeth (GEBAUERS) aerosol 1 application  1  application Topical PRN Sol Blazing, MD      . phenol [facility]) mouth spray 1 spray  1 spray Mouth/Throat PRN Alvia Grove, PA-C      . polyethylene glycol (MIRALAX / GLYCOLAX) packet 17 g  17 g Oral Daily Sarah J Gribbin, PA-C      . predniSONE (DELTASONE) tablet 5 mg  5 mg Oral Daily Alvia Grove, PA-C   5 mg at 08/28/14 0936  . senna-docusate (Senokot-S) tablet 1 tablet  1 tablet Oral BID PRN Ulyses Amor, PA-C   1 tablet at 08/23/14 1004  . sodium chloride 0.9 % injection 3 mL  3 mL Intravenous Q12H Conrad [place], MD   3 mL at 08/28/14 0926  . sodium chloride 0.9 % injection 3 mL  3 mL Intravenous PRN Conrad [place], MD       Facility-Administered Medications Ordered in Other Encounters  Medication Dose Route Frequency Provider Last Rate Last Dose  . fentaNYL (SUBLIMAZE) injection   Intravenous PRN Carylon Perches, MD   25 mcg at 05/05/12 1544  . midazolam (VERSED) 5 MG/5ML injection   Intravenous PRN Carylon Perches, MD   2 mg at 05/05/12 1544    Allergies as of 08/16/2014 - Review Complete 08/16/2014  Allergen Reaction Noted  . Allopurinol Other (See Comments) 02/27/2010    Family History  Problem Relation Age of Onset  . Hypertension Father   . Pneumonia Brother   . Lung disease Brother     History   Social History  . Marital Status: Single    Spouse Name: N/A    Number of Children: N/A  . Years of Education: N/A   Occupational History  . Not on file.   Social History Main Topics  . Smoking status: Former Smoker -- 1.00 packs/day for 10 years    Types: Cigarettes    Quit date: 03/23/1973  . Smokeless tobacco: Never Used     Comment: Quit 30 years ago  . Alcohol Use: No  . Drug Use: No  . Sexual Activity: No   Other Topics Concern  . Not on file   Social History Narrative   Currently in the nursing home until end of the month then will go to live with sister.           Review of Systems: Gen: Denies any fever, chills, sweats CV: Denies  chest pain, angina, palpitations, syncope. Resp: Denies cough, sputum, wheezing, coughing up blood, and pleurisy. GI: Denies vomiting blood, jaundice.  Has a long history of difficulty with his GI tract.  Eats very little. Psych: Denies depression, anxiety, memory loss, suicidal ideation, hallucinations, paranoia, and confusion. Heme: Denies bruising, bleeding, and enlarged lymph nodes. Neuro:  Denies  any headaches, dizziness, paresthesias. Endo:  Denies any problems with DM, thyroid, adrenal function.  Physical Exam: Vital signs in last 24 hours: Temp:  [97.4 F (36.3 C)-98.9 F (37.2 C)] 97.4 F (36.3 C) (12/07 1146) Pulse Rate:  [67-84] 67 (12/07 1200) Resp:  [14-27] 14 (12/07 1200) BP: (85-183)/(30-139) 141/57 mmHg (12/07 1100) SpO2:  [92 %-100 %] 95 % (12/07 1000) Last BM Date: 08/27/14  General:   Alert,  Thin, NAD, lying under a thick blanket complaining of being cold. HEENT: EOMI,  no scleral icterus.  Halitosis. Neck:  Supple; no masses, no JVD. Lungs:  Clear throughout to auscultation.   No wheezes, crackles, or rhonchi.  Heart:  Regular rate and rhythm; no murmurs, clicks, rubs,  or gallops. Abdomen:  Cachectic, but soft, nontender and nondistended. No masses,  Normal bowel sounds.  Pulses:  Normal pulses noted. Extremities:  Without clubbing or edema. Great left toe black with foul odor.  Bottom of right foot with large dark black patch of skin.   Neurologic:  Alert and  oriented x4;  grossly normal neurologically. Psych:  Alert and cooperative. Normal mood and affect.   Lab Results:  Recent Labs  08/27/14 1254 08/27/14 2020 08/28/14 0341  WBC 7.1 12.0* 11.0*  HGB 8.3* 10.6* 10.3*  HCT 24.5* 31.2* 30.5*  PLT 121* 138* 157   BMET  Recent Labs  08/26/14 0840  NA 138  K 4.1  CL 96  CO2 26  GLUCOSE 98  BUN 33*  CREATININE 7.10*  CALCIUM 8.4   LFT  Recent Labs  08/26/14 0840  ALBUMIN 1.8*   PT/INR  Recent Labs  08/26/14 1730 08/27/14 1254   LABPROT 20.0* 15.6*  INR 1.68* 1.23     Studies/Results:  12/02 LT leg doppler >> no DVT   Ct Abdomen Pelvis W Contrast  08/27/2014   CLINICAL DATA:  Acute onset of bright red blood per rectum, with periumbilical cramping abdominal pain. Initial encounter.  EXAM: CT ABDOMEN AND PELVIS WITH CONTRAST  TECHNIQUE: Multidetector CT imaging of the abdomen and pelvis was performed using the standard protocol following bolus administration of intravenous contrast.  CONTRAST:  121mL OMNIPAQUE IOHEXOL 300 MG/ML  SOLN  COMPARISON:  CT of the abdomen and pelvis from 11/10/2012  FINDINGS: Trace bilateral pleural effusions are seen, with bibasilar atelectasis.  A nonspecific 6 mm hypodensity within the left hepatic lobe is of uncertain significance. The liver is otherwise unremarkable. The spleen is mildly bulky, but remains normal in length. There is persistent chronic gallbladder wall thickening. The gallbladder is difficult to fully assess given trace ascites tracking about the liver.  The pancreas and adrenal glands are unremarkable.  The patient's native kidneys are markedly atrophic, with scattered bilateral renal cysts of varying size and attenuation. Scattered associated calcifications are seen. There is no evidence of hydronephrosis. No obstructing renal stones are seen.  There is marked fatty infiltration with regard to the transplant kidney at the left iliac fossa, with marked thinning of the renal parenchyma. Diffuse associated vascular calcifications are seen. This appearance is stable from 2014.  There is mild diffuse mesenteric and omental edema, nonspecific in appearance.  The stomach is within normal limits. No acute vascular abnormalities are seen. Diffuse calcification is seen along the abdominal aorta and its branches. Extensive diffuse vascular calcification is noted throughout the abdomen and pelvis.  The patient appears to be status post resection of much of the bowel. Remaining small bowel is  grossly unremarkable. A distended anastomosis  is noted at the right hemipelvis; this corresponds to the site of the prior right pelvic catheter, which has been removed.  The appendix is not definitely characterized; there is no evidence for appendicitis. Contrast progresses to the level of the proximal sigmoid colon. There is scattered diverticulosis along the transverse colon. Stool is noted filling the distal sigmoid colon and rectum.  There is soft tissue inflammation about the distal sigmoid colon and rectum, raising question for proctitis. Diffuse presacral stranding is also noted, new from the prior study.  The bladder is not well assessed. Soft tissue density about the expected location of the bladder is relatively stable in appearance. 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 This is perhaps slightly more prominent than on the prior study. No inguinal lymphadenopathy is seen.  No acute osseous abnormalities are identified.  IMPRESSION: 1. Soft tissue inflammation about the distal sigmoid colon and rectum, with diffuse presacral stranding, new from the prior study. This raises concern for proctitis, which may explain the patient's bright red blood per rectum. Stool noted filling the distal sigmoid colon and rectum. 2. Scattered diverticulosis along the transverse colon, without evidence of diverticulitis; the colon is otherwise grossly unremarkable. 3. Trace bilateral pleural effusions, with bibasilar atelectasis. 4. Marked atrophy of the patient's bilateral native kidneys, with bilateral renal cysts and scattered calcifications. Marked fatty infiltration of the transplant kidney at the left iliac fossa, with marked thinning of the renal parenchyma. This appearance is stable from 2014. 5. Stable appearance to soft tissue density about the expected location of the bladder. 6. 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 7. Diffuse calcification along the abdominal aorta and its branches. Extensive diffuse vascular calcification seen throughout the abdomen and pelvis. 8. Nonspecific 6 mm hypodensity within the left hepatic lobe, new from prior studies. 9. Persistent chronic gallbladder wall thickening; gallbladder difficult to fully assess given trace ascites about the liver, but this appearance is relatively stable. 10. Mild nonspecific diffuse mesenteric and omental edema.   Electronically Signed   By: Garald Balding M.D.   On: 08/27/2014 01:30   Dg Abd Portable 1v  08/26/2014   CLINICAL DATA:  Acute GI bleed  EXAM: PORTABLE ABDOMEN - 1 VIEW  COMPARISON:  CT abdomen pelvis dated 11/10/2012  FINDINGS: Nonspecific but nonobstructive bowel gas pattern.  Surgical clips overlying the pelvis.  Vascular calcifications.  IMPRESSION: Unremarkable abdominal radiograph.   Electronically Signed   By: Julian Hy M.D.   On: 08/26/2014 22:30    Impression:  GI Bleed - patient known vasculopath and on dual antiplatelet therapy, had sudden onset lower GI bleed. Gastroenterology following, appreciate input. Looks like this is in the setting of possible ischemic colitis. His aspirin and Plavix for now on hold. He has received a total of 7 units packed red blood cells as well as 4 units of FFP and his hemoglobin is now stable. His last bloody bowel movement was on 12/6. Currently there are no plans for a colonoscopy or sigmoidoscopy unless the bleeding persists. Advance diet per GI from clears to full, add MiraLAX. - Appreciate GI input as to when, from their standpoint, potentially aspirin and Plavix can be restarted  ESRD - On HD TTS.  Nephrology following.  Peripheral vascular disease - patient well known to the vascular surgeons and with  history of multiple interventions, had an ischemic right foot  status post right femoral-popliteal bypass on 11/27, also had left popliteal angioplasty 2 in April and October 2015, also status post stenting of left common femoral vein 08/24/14.  - developing ischemia to the right 1-3 toes and great left toe  CAD s/p DES x 3.  - Denies chest pain, cardiology consulted today by vascular surgery.  Severe protein calorie malnutrition - Slowly advance diet per GI.  Hx of Gout - On 5 mg chronic prednisone and colchicine.  HTN  Stable. On amlodipine and nitro patch.   Has IV metoprolol ordered PRN.    LOS: 12 days   Karen Kitchens [facility] [phone]  08/28/2014, 1:07 PM      Patient seen and examined, chart and data base reviewed.  I agree with the above assessment and plan and have edited the above note. Complex patient currently on the vascular service, stable from this standpoint and with new GI bleed that develop over the weekend. Has multiple medical problems, and we'll be glad to take over with vascular surgery as a consult.   For full details please see Mrs. Imogene Burn PA note.  Marzetta Board, MD [facility] [phone]

## 2014-08-28 NOTE — H&P (Signed)
Admit date: 08/16/2014 Referring Physician  Dr. Trula Slade Primary Cardiologist  Dr. Shelva Majestic Reason for Consultation  GI bleed on anticoagulation  HPI: This is a 53yo male with a history of ASCAD with cath 12/2013 with calcified LM, 30-40% prox LAD, 50% mid LAD, 50% ostial left firc and 30-40% mid left circ, 70% prox and 96/85% mid RCA stenosis s/p rotoblator and stenting - EF 45-50% now on Plavix and ASA, ESRD on HD, HTN, chronic systolic CHF, GERD, Hep C, PVD s/p atherectomy and PTA of the left popliteal artery 12/2013 and dry gangrene of the left great toe and underwent femoral-popliteal bypass graft of the right leg at end of November but had persistent right foot pain and US showed occluded posterior tibial artery and pain persisted and repeat US showed occluded popliteal artery.  He underwent He was admitted on 12/1 by Vascular surgery for pain control and started on IV Heparin gtt.  He underwent left thigh shuntogram with venoplasty and stenting of the left common femoral vein on 12/3.  He has a history of SBO with pericolonic perforation in the past.  He developed abdominal pain and bright red blood per rectum while on ASA/Plavix.  He was been transferred to the ICU and was seen by GI and was felt to have ischemic colitis.  He has received 4 units of PRBC's and FFP. His ASA and Plavix are on hold.  Cardiology is now asked to consult.      PMH:   Past Medical History  Diagnosis Date  . ESRD on hemodialysis     a. ESRD 2/2 HTN with renal transplant in 1987 (cadaveric) after short period of dialysis;  b. Transplant failed in 2004 and he went back on HD;  c. As of 10/15 getting HD via L thigh AVG on a TTS schedule at [facility] on [facility].  . Hypertension   . Hx of kidney transplant     a. 1987-> back on HD since 2004  . Gout tophi   . Chronic steroid use     a. Has severe gout. Did not tolerate Allopurinol. Do not taper per PCP   . Systolic CHF, chronic     2 D echo 04/2012 with EF of  45 %   . Malnutrition   . Hx SBO 04/2012    a. 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 F/U with Dr Donne Hazel. Started on TNA  . Anemia associated with chronic renal failure   . Hepatitis C   . GERD (gastroesophageal reflux disease)   . H/O hiatal hernia   . Arthritis   . History of DVT (deep vein thrombosis)   . Peripheral vascular disease     a. 12/2013 PTA of L Pop;  b. 12/2013 PTA R pop, R DP;  c. 06/2014 L Pop CBA/DCB PTA.  . Prostate cancer   . CAD (coronary artery disease)     a. 12/2013 Cath/PCI: EF 45-50%, LM Ca2+, LAD 30-40p, 76m, D1/2/3 min irregs, LCX 50ost, 30-26m, RCA 70p, 95/74m (Rota->3.0x23 Xience distal, 3.0x23 Xience mid, 3.0x28 Xience prox).  . Chronic diastolic CHF (congestive heart failure)     a. 12/2013 Echo: EF 55-60%, mild LVH, nl wall motion, Gr 2 DD.  . Dry gangrene     a. L great toe     PSH:   Past Surgical History  Procedure Laterality Date  . Laparotomy  04/14/2012  Procedure: EXPLORATORY LAPAROTOMY;  Surgeon: Joyice Faster. Cornett, MD;  Location: [place];  Service: General;  Laterality: N/A;  . Colon resection  04/14/2012  . Laparotomy  04/22/2012    Procedure: EXPLORATORY LAPAROTOMY;  Surgeon: Adin Hector, MD;  Location: [place];  Service: General;  Laterality: N/A;  lysis of adhesions, omentoplasty, repair small bowel  . Thrombectomy w/ embolectomy  04/27/2012    Procedure: THROMBECTOMY ARTERIOVENOUS GORE-TEX GRAFT;  Surgeon: Angelia Mould, MD;  Location: [place];  Service: Vascular;  Laterality: Left;  Thrombectomy of left thigh arteriovenous gortex graft  . Prostectomy  2011  . Renal grafts    . Pelvic abcess drainage Right 6/14    removal drain s/p bowl resection 13  . Transanal excision of rectal mass N/A 03/30/2013    Procedure: EXCISION OF anal MASS;  Surgeon: Joyice Faster. Cornett, MD;  Location: [place];  Service: General;  Laterality: N/A;  Exam under anesthesia with excision  anal verge mass  . Aortogram  08/15/2014    abd aortogram  . Femoral-popliteal bypass graft Right 08/18/2014    Procedure: BYPASS GRAFT FEMORAL-POPLITEAL ARTERY with Gortex Graft;  Surgeon: Serafina Mitchell, MD;  Location: [place];  Service: Vascular;  Laterality: Right;    Allergies:  Allopurinol Prior to Admit Meds:   Prescriptions prior to admission  Medication Sig Dispense Refill Last Dose  . amLODipine (NORVASC) 5 MG tablet Take 5 mg by mouth daily.   11 08/15/2014 at Unknown time  . aspirin EC 81 MG tablet Take 81 mg by mouth daily.   08/16/2014 at Unknown time  . atorvastatin (LIPITOR) 40 MG tablet TAKE 1 TABLET BY MOUTH DAILY AT 6 PM 30 tablet 9 08/15/2014 at Unknown time  . calcium acetate (PHOSLO) 667 MG tablet Take 2,001 mg by mouth 3 (three) times daily.    08/15/2014 at Unknown time  . carvedilol (COREG) 6.25 MG tablet TAKE 1 TABLET BY MOUTH TWICE DAILY WITH MEALS 60 tablet 9 08/16/2014 at 0800  . cinacalcet (SENSIPAR) 60 MG tablet Take 60 mg by mouth daily.   08/15/2014 at Unknown time  . clopidogrel (PLAVIX) 75 MG tablet Take 1 tablet (75 mg total) by mouth daily with breakfast. 30 tablet 10 08/16/2014 at Unknown time  . COLCRYS 0.6 MG tablet Take 0.6 mg by mouth daily.    08/16/2014 at Unknown time  . HYDROmorphone (DILAUDID) 2 MG tablet Take 1 to 2 tablets by mouth every 6 hours as needed for postoperative pain. (Patient taking differently: Take 1-2 mg by mouth every 6 (six) hours as needed (pain). ) 40 tablet 0 08/16/2014 at Unknown time  . LIDOCAINE EX Apply 1 application topically See admin instructions. Apply to inject site on dialysis days (Tues, Thur, Sat) if needed for pain   08/15/2014 at Unknown time  . loperamide (IMODIUM A-D) 2 MG tablet Take 2 mg by mouth daily as needed for diarrhea or loose stools.    unknown  . nitroGLYCERIN (NITRODUR - DOSED IN MG/24 HR) 0.3 mg/hr patch Place 1 patch (0.3 mg total) onto the skin daily. 30 patch 12 Past Week at Unknown time  .  oxyCODONE (ROXICODONE) 5 MG immediate release tablet Take 1 tablet (5 mg total) by mouth every 4 (four) hours as needed for severe pain. 30 tablet 0 Past Week at Unknown time  . predniSONE (DELTASONE) 5 MG tablet Take 5 mg by mouth daily.   08/16/2014 at Unknown time  . multivitamin (RENA-VIT) TABS tablet Take 1  tablet by mouth daily. 30 tablet 0 08/14/2014 at Unknown time   Fam HX:    Family History  Problem Relation Age of Onset  . Hypertension Father   . Pneumonia Brother   . Lung disease Brother    Social HX:    History   Social History  . Marital Status: Single    Spouse Name: N/A    Number of Children: N/A  . Years of Education: N/A   Occupational History  . Not on file.   Social History Main Topics  . Smoking status: Former Smoker -- 1.00 packs/day for 10 years    Types: Cigarettes    Quit date: 03/23/1973  . Smokeless tobacco: Never Used     Comment: Quit 30 years ago  . Alcohol Use: No  . Drug Use: No  . Sexual Activity: No   Other Topics Concern  . Not on file   Social History Narrative   Currently in the nursing home until end of the month then will go to live with sister.            ROS:  All 11 ROS were addressed and are negative except what is stated in the HPI  Physical Exam: Blood pressure 141/57, pulse 67, temperature 97.4 F (36.3 C), temperature source Oral, resp. rate 14, height 5\' 11"  (1.803 m), weight 101 lb 13.6 oz (46.2 kg), SpO2 95 %.    General: Well developed, well nourished, in no acute distress Head: Eyes PERRLA, No xanthomas.   Normal cephalic and atramatic  Lungs:   Clear bilaterally to auscultation and percussion. Heart:   HRRR S1 S2 Pulses are 2+ & equal.            No carotid bruit. No JVD.  No abdominal bruits. No femoral bruits. Abdomen: Bowel sounds are positive, abdomen soft and non-tender without masses  Extremities:   No clubbing, cyanosis or edema.  DP +1 Neuro: Alert and oriented X 3. Psych:  Good affect, responds  appropriately    Labs:   Lab Results  Component Value Date   WBC 11.0* 08/28/2014   HGB 10.3* 08/28/2014   HCT 30.5* 08/28/2014   MCV 82.4 08/28/2014   PLT 157 08/28/2014    Recent Labs Lab 08/26/14 0840  NA 138  K 4.1  CL 96  CO2 26  BUN 33*  CREATININE 7.10*  CALCIUM 8.4  GLUCOSE 98   No results found for: PTT Lab Results  Component Value Date   INR 1.23 08/27/2014   INR 1.68* 08/26/2014   INR 1.18 08/16/2014   Lab Results  Component Value Date   CKTOTAL 34 07/19/2014   CKMB 1.9 07/19/2014   TROPONINI 0.73* 07/20/2014     Lab Results  Component Value Date   CHOL 121 01/04/2014   CHOL 173 05/31/2012   CHOL 158 05/24/2012   Lab Results  Component Value Date   HDL 68 01/04/2014   Lab Results  Component Value Date   LDLCALC 26 01/04/2014   Lab Results  Component Value Date   TRIG 137 01/04/2014   TRIG 125 05/31/2012   TRIG 128 05/24/2012   Lab Results  Component Value Date   CHOLHDL 1.8 01/04/2014   No results found for: LDLDIRECT    Radiology:  Ct Abdomen Pelvis W Contrast  08/27/2014   CLINICAL DATA:  Acute onset of bright red blood per rectum, with periumbilical cramping abdominal pain. Initial encounter.  EXAM: CT ABDOMEN AND PELVIS WITH CONTRAST  TECHNIQUE: Multidetector CT imaging of the abdomen and pelvis was performed using the standard protocol following bolus administration of intravenous contrast.  CONTRAST:  157mL OMNIPAQUE IOHEXOL 300 MG/ML  SOLN  COMPARISON:  CT of the abdomen and pelvis from 11/10/2012  FINDINGS: Trace bilateral pleural effusions are seen, with bibasilar atelectasis.  A nonspecific 6 mm hypodensity within the left hepatic lobe is of uncertain significance. The liver is otherwise unremarkable. The spleen is mildly bulky, but remains normal in length. There is persistent chronic gallbladder wall thickening. The gallbladder is difficult to fully assess given trace ascites tracking about the liver.  The pancreas and adrenal  glands are unremarkable.  The patient's native kidneys are markedly atrophic, with scattered bilateral renal cysts of varying size and attenuation. Scattered associated calcifications are seen. There is no evidence of hydronephrosis. No obstructing renal stones are seen.  There is marked fatty infiltration with regard to the transplant kidney at the left iliac fossa, with marked thinning of the renal parenchyma. Diffuse associated vascular calcifications are seen. This appearance is stable from 2014.  There is mild diffuse mesenteric and omental edema, nonspecific in appearance.  The stomach is within normal limits. No acute vascular abnormalities are seen. Diffuse calcification is seen along the abdominal aorta and its branches. Extensive diffuse vascular calcification is noted throughout the abdomen and pelvis.  The patient appears to be status post resection of much of the bowel. Remaining small bowel is grossly unremarkable. A distended anastomosis is noted at the right hemipelvis; this corresponds to the site of the prior right pelvic catheter, which has been removed.  The appendix is not definitely characterized; there is no evidence for appendicitis. Contrast progresses to the level of the proximal sigmoid colon. There is scattered diverticulosis along the transverse colon. Stool is noted filling the distal sigmoid colon and rectum.  There is soft tissue inflammation about the distal sigmoid colon and rectum, raising question for proctitis. Diffuse presacral stranding is also noted, new from the prior study.  The bladder is not well assessed. Soft tissue density about the expected location of the bladder is relatively stable in appearance. 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 This is perhaps slightly more prominent than on the prior study. No inguinal lymphadenopathy is seen.  No acute osseous abnormalities are  identified.  IMPRESSION: 1. Soft tissue inflammation about the distal sigmoid colon and rectum, with diffuse presacral stranding, new from the prior study. This raises concern for proctitis, which may explain the patient's bright red blood per rectum. Stool noted filling the distal sigmoid colon and rectum. 2. Scattered diverticulosis along the transverse colon, without evidence of diverticulitis; the colon is otherwise grossly unremarkable. 3. Trace bilateral pleural effusions, with bibasilar atelectasis. 4. Marked atrophy of the patient's bilateral native kidneys, with bilateral renal cysts and scattered calcifications. Marked fatty infiltration of the transplant kidney at the left iliac fossa, with marked thinning of the renal parenchyma. This appearance is stable from 2014. 5. Stable appearance to soft tissue density about the expected location of the bladder. 6. 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 7. Diffuse calcification along the abdominal aorta and its branches. Extensive diffuse vascular calcification seen throughout the abdomen and pelvis. 8. Nonspecific 6 mm hypodensity within the left hepatic lobe, new from prior studies. 9. Persistent chronic gallbladder  wall thickening; gallbladder difficult to fully assess given trace ascites about the liver, but this appearance is relatively stable. 10. Mild nonspecific diffuse mesenteric and omental edema.   Electronically Signed   By: Garald Balding M.D.   On: 08/27/2014 01:30   Dg Abd Portable 1v  08/26/2014   CLINICAL DATA:  Acute GI bleed  EXAM: PORTABLE ABDOMEN - 1 VIEW  COMPARISON:  CT abdomen pelvis dated 11/10/2012  FINDINGS: Nonspecific but nonobstructive bowel gas pattern.  Surgical clips overlying the pelvis.  Vascular calcifications.  IMPRESSION: Unremarkable abdominal radiograph.   Electronically Signed   By: Julian Hy M.D.   On: 08/26/2014 22:30     ASSESSMENT:  1. ASCAD s/p rotablator with DES x 3 to the proximal and mid RCA in April 2015.  He has been on DAPT since then and it was recommended as long term therapy. 2.  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  HTN - controlled. 4.  ESRD on HD 5.  Acute lower GI bleed most likely from ischemic colitis = plavix and ASA on hold 6.  Severe PVD  PLAN:   1.  Patient has had 3 drug eluting stents placed 8 months ago.  Recommendations are for at least 1 year of DAPT and his patient's case long term due to degree of underlying CAD and stenting performed on the RCA.   Agree with holding Plavix and ASA for now.  Final decision on when it is safe to restart ASA and Plaivx needs to be made by GI. 2.  Continue statin and restart Coreg as BP tolerates  Sueanne Margarita, MD  08/28/2014  12:39 PM

## 2014-08-29 ENCOUNTER — Inpatient Hospital Stay (HOSPITAL_COMMUNITY): Payer: Medicare Other

## 2014-08-29 LAB — CBC
HEMATOCRIT: 29.6 % — AB (ref 39.0–52.0)
HEMOGLOBIN: 10.1 g/dL — AB (ref 13.0–17.0)
MCH: 29.1 pg (ref 26.0–34.0)
MCHC: 34.1 g/dL (ref 30.0–36.0)
MCV: 85.3 fL (ref 78.0–100.0)
Platelets: 123 10*3/uL — ABNORMAL LOW (ref 150–400)
RBC: 3.47 MIL/uL — ABNORMAL LOW (ref 4.22–5.81)
RDW: 16.2 % — AB (ref 11.5–15.5)
WBC: 18.4 10*3/uL — ABNORMAL HIGH (ref 4.0–10.5)

## 2014-08-29 LAB — COMPREHENSIVE METABOLIC PANEL
ALBUMIN: 1.7 g/dL — AB (ref 3.5–5.2)
ALT: 14 U/L (ref 0–53)
ANION GAP: 12 (ref 5–15)
AST: 34 U/L (ref 0–37)
Alkaline Phosphatase: 108 U/L (ref 39–117)
BILIRUBIN TOTAL: 1.2 mg/dL (ref 0.3–1.2)
BUN: 35 mg/dL — AB (ref 6–23)
CHLORIDE: 101 meq/L (ref 96–112)
CO2: 24 mEq/L (ref 19–32)
CREATININE: 6.69 mg/dL — AB (ref 0.50–1.35)
Calcium: 7.5 mg/dL — ABNORMAL LOW (ref 8.4–10.5)
GFR calc non Af Amer: 8 mL/min — ABNORMAL LOW (ref >90)
GFR, EST AFRICAN AMERICAN: 10 mL/min — AB (ref >90)
GLUCOSE: 122 mg/dL — AB (ref 70–99)
POTASSIUM: 4.9 meq/L (ref 3.7–5.3)
Sodium: 137 mEq/L (ref 137–147)
Total Protein: 4.6 g/dL — ABNORMAL LOW (ref 6.0–8.3)

## 2014-08-29 LAB — POCT I-STAT 3, ART BLOOD GAS (G3+)
Bicarbonate: 26 mEq/L — ABNORMAL HIGH (ref 20.0–24.0)
O2 SAT: 61 %
PCO2 ART: 44.4 mmHg (ref 35.0–45.0)
PH ART: 7.372 (ref 7.350–7.450)
PO2 ART: 32 mmHg — AB (ref 80.0–100.0)
Patient temperature: 97.4
TCO2: 27 mmol/L (ref 0–100)

## 2014-08-29 LAB — LACTIC ACID, PLASMA: LACTIC ACID, VENOUS: 0.9 mmol/L (ref 0.5–2.2)

## 2014-08-29 LAB — HEMOGLOBIN AND HEMATOCRIT, BLOOD
HCT: 28.4 % — ABNORMAL LOW (ref 39.0–52.0)
Hemoglobin: 9.7 g/dL — ABNORMAL LOW (ref 13.0–17.0)

## 2014-08-29 LAB — PROTIME-INR
INR: 1.19 (ref 0.00–1.49)
Prothrombin Time: 15.2 seconds (ref 11.6–15.2)

## 2014-08-29 LAB — TROPONIN I: Troponin I: <0.3 ng/mL (ref <0.30)

## 2014-08-29 MED ORDER — SODIUM CHLORIDE 0.9 % IV SOLN
Freq: Once | INTRAVENOUS | Status: AC
  Administered 2014-08-30: 02:00:00 via INTRAVENOUS

## 2014-08-29 MED ORDER — HYDROCORTISONE NA SUCCINATE PF 100 MG IJ SOLR
50.0000 mg | Freq: Four times a day (QID) | INTRAMUSCULAR | Status: DC
  Administered 2014-08-29 – 2014-08-30 (×5): 50 mg via INTRAVENOUS
  Filled 2014-08-29 (×9): qty 1

## 2014-08-29 NOTE — Progress Notes (Signed)
PULMONARY / CRITICAL CARE MEDICINE   Name: [name] MRN: [id number] DOB: 1960/11/11    ADMISSION DATE:  08/16/2014 CONSULTATION DATE:  08/29/2014  REFERRING MD :  Trula Slade  CHIEF COMPLAINT:  Bright red blood per rectum  INITIAL PRESENTATION:  53 yo male with hx of diverticulosis transferred to ICU with lower GI bleeding.  He has hx of PVD s/p Rt fem-pop bypass 11/25 and venoplasty Lt femoral vein 12/03, CAD s/p DES April 2015 on ASA and plavix.  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  STUDIES:  12/02 LT leg doppler >> no DVT 12/06 CT abd >> soft tissue inflammation at distal sigmoid/rectum  SIGNIFICANT EVENTS: 12/05 LGI bleed, to ICU, GI consulted 12/07 Cardiology consulted 12/08 Back to ICU with LGI bleed, transfuse 2 units PRBC added pressors  SUBJECTIVE:  Still has bloating.  VITAL SIGNS: Temp:  [96.7 F (35.9 C)-97.6 F (36.4 C)] 96.7 F (35.9 C) (12/08 0700) Pulse Rate:  [25-118] 81 (12/08 0915) Resp:  [12-25] 15 (12/08 0915) BP: (77-143)/(18-93) 121/36 mmHg (12/08 0900) SpO2:  [81 %-100 %] 100 % (12/08 0915) Weight:  [108 lb 11 oz (49.3 kg)] 108 lb 11 oz (49.3 kg) (12/08 0645) INTAKE / OUTPUT:  Intake/Output Summary (Last 24 hours) at 08/29/14 0926 Last data filed at 08/29/14 0800  Gross per 24 hour  Intake 1697.5 ml  Output      0 ml  Net 1697.5 ml    PHYSICAL EXAMINATION: General: no distress Neuro: follows commands HEENT: no sinus tenderness Cardiovascular: regular Lungs: no wheeze Abdomen: soft, non tender Musculoskeletal: no edema, decreased muscle bulk Skin:  Dry gangrene right toes  LABS:  CBC  Recent Labs Lab 08/28/14 1849 08/28/14 2133 08/29/14 0520  WBC 16.7* 20.6* 18.4*  HGB 9.7* 8.3* 10.1*  HCT 29.2* 24.7* 29.6*  PLT 221 253 123*   Coag's  Recent Labs Lab 08/26/14 1730 08/27/14 1254 08/29/14 0520  APTT 42* 37  --   INR 1.68* 1.23 1.19   BMET  Recent Labs Lab 08/24/14 1700 08/26/14 0840 08/29/14 0520  NA  136* 138 137  K 5.0 4.1 4.9  CL 93* 96 101  CO2 19 26 24   BUN 54* 33* 35*  CREATININE 9.22*  9.22* 7.10* 6.69*  GLUCOSE 63* 98 122*   Electrolytes  Recent Labs Lab 08/22/14 1145 08/24/14 1700 08/26/14 0840 08/29/14 0520  CALCIUM 8.5 8.6 8.4 7.5*  PHOS 5.3* 5.4* 4.5  --    Liver Enzymes  Recent Labs Lab 08/24/14 1700 08/26/14 0840 08/29/14 0520  AST  --   --  34  ALT  --   --  14  ALKPHOS  --   --  108  BILITOT  --   --  1.2  ALBUMIN 1.9* 1.8* 1.7*    Imaging No results found.   ASSESSMENT / PLAN: Lt IJ CVL 12/08 >>  Hemorrhagic shock 2nd to LGI bleed. Plan: Monitor CVP Wean off pressors to keep MAP > 60 F/u Hb >> transfuse for bleeding or Hb < 7 Continue solu cortef  LGI bleed possibly from ischemic colitis in setting of diverticulosis. Plan: F/u with GI  Severe protein calorie malnutrition. Plan: Advance diet per primary team and GI  Hx of ESRD on HD. Plan: Per renal  Hx of Gout on chronic prednisone. Plan: Transition back to prednisone when hemodynamics stable  Hx of CAD s/p DES in April 2015. PVD. Hx of HTN, HLD. Plan: Will need to determine when to resume  ASA, plavix Continue lipitor Post op care per [facility]  CC time 40 minutes.  Chesley Mires, MD [facility] Pulmonary/Critical Care 08/29/2014, 9:26 AM Pager:  [phone] After 3pm call: [phone]

## 2014-08-29 NOTE — Progress Notes (Signed)
Pt continues to have bloody stools with large clots. BP low 100s/40s. CCM notified. Will check CBC and continue to monitor.  Vella Raring, RN

## 2014-08-29 NOTE — Progress Notes (Signed)
Pt still passing large blood clots. Pt complains of abdominal cramping before it occurs. BP slightly dropping to 90s/40s. CCM notified. No new orders given. Will continue to monitor.  Vella Raring, RN

## 2014-08-29 NOTE — Progress Notes (Signed)
PT Cancellation Note  Patient Details Name: [name] MRN: [id number] DOB: 1961/05/07   Cancelled Treatment:    Reason Eval/Treat Not Completed: Medical issues which prohibited therapy (noted events of evening with pt still on vasopressor and RN request hold. will defer at this time)   Melford Aase 08/29/2014, 7:45 AM Elwyn Reach, [place]

## 2014-08-29 NOTE — Progress Notes (Signed)
Assessment: 1. Acute GI bleed - recurrent  Per GI. Possible colonscopy 2. Ischemic R foot, s/p R fem-pop 11/27 3. Ischemic L foot s/p L popliteal angioplasty x 2 per [facility] (April then October 2015) 4. Left leg edema - resolved, s/p venoplasty / stenting of left CF vein 09/03/14 5. ESRD on HD - no hep HD for now given GIB,TTS-In AM 6. HTN/volume - BP up after blood products, will resume low dose norvasc 2.5 bid. At dry wt.  7. Anemia ^'d aranesp to 60/wk, cont IV Fe 8. Sec HPTH pth 713, cont vit D, sensipar, phoslo 9. Hx CAD s/p PCI/ stents - on NTG patch   Subjective: Interval History: Abd pain and bloody stools last PM  Objective: Vital signs in last 24 hours: Temp:  [96.7 F (35.9 C)-97.6 F (36.4 C)] 96.7 F (35.9 C) (12/08 0700) Pulse Rate:  [25-118] 81 (12/08 0915) Resp:  [12-25] 15 (12/08 0915) BP: (77-143)/(18-93) 121/36 mmHg (12/08 0900) SpO2:  [81 %-100 %] 100 % (12/08 0915) Weight:  [49.3 kg (108 lb 11 oz)] 49.3 kg (108 lb 11 oz) (12/08 0645) Weight change:   Intake/Output from previous day: 12/07 0701 - 12/08 0700 In: 1597.5 [P.O.:480; I.V.:447.5; Blood:670] Out: -  Intake/Output this shift: Total I/O In: 100 [P.O.:100] Out: -   General appearance: alert and cooperative Resp: clear to auscultation bilaterally Chest wall: no tenderness Cardio: regular rate and rhythm, S1, S2 normal, no murmur, click, rub or gallop Extremities: extremities normal, atraumatic, no cyanosis or edema  abd mild tenderness  Lab Results:  Recent Labs  08/28/14 2133 08/29/14 0520  WBC 20.6* 18.4*  HGB 8.3* 10.1*  HCT 24.7* 29.6*  PLT 253 123*   BMET:  Recent Labs  08/29/14 0520  NA 137  K 4.9  CL 101  CO2 24  GLUCOSE 122*  BUN 35*  CREATININE 6.69*  CALCIUM 7.5*   No results for input(s): PTH in the last 72 hours. Iron Studies: No results for input(s): IRON, TIBC, TRANSFERRIN, FERRITIN in the last 72 hours. Studies/Results: Dg Chest Port 1 View  08/29/2014    CLINICAL DATA:  Central line placement.  EXAM: PORTABLE CHEST - 1 VIEW  COMPARISON:  07/03/2012  FINDINGS: Interval placement of a left central venous catheter. Tip projects to the left mediastinum over the aortic arch. This could be in the brachiocephalic vein, in a normal variant left superior vena cava, or an arterial branch. No pneumothorax. Calcification in the left apex is likely postinflammatory. This is new since previous study. Heart size is normal. Hemidiaphragms are not included within the field of view.  IMPRESSION: Nonspecific placement of left central venous catheter to the left of midline, suggesting placement either in the brachiocephalic vein, left superior vena cava, or arterial branch. No pneumothorax.   Electronically Signed   By: Lucienne Capers M.D.   On: 08/29/2014 02:17   Scheduled: . sodium chloride   Intravenous Once  . atorvastatin  40 mg Oral q1800  . calcitRIOL  2.75 mcg Oral Q T,Th,Sa-HD  . calcium acetate  2,001 mg Oral TID WC  . cinacalcet  60 mg Oral Daily  . colchicine  0.3 mg Oral Once per day on Mon Thu  . darbepoetin (ARANESP) injection - DIALYSIS  60 mcg Intravenous Q Sat-HD  . docusate sodium  100 mg Oral BID  . feeding supplement (RESOURCE BREEZE)  1 Container Oral TID BM  . hydrocortisone sodium succinate  50 mg Intravenous 4 times per day  .  multivitamin  1 tablet Oral QHS  . pantoprazole  40 mg Oral Daily  . polyethylene glycol  17 g Oral Daily  . sodium chloride  3 mL Intravenous Q12H     LOS: 13 days   Aryn Safran C 08/29/2014,10:35 AM

## 2014-08-29 NOTE — Progress Notes (Signed)
PAtient actively passing clots BP still low despite volume challenge  Plan PA to do consult and place CVL 2 Units PRBC    Dr. Brand Males, M.D., [facility].C.P Pulmonary and Critical Care Medicine Staff Physician [place] [facility] and [facility] Pager: [phone], If no answer or between  15:00h - 7:00h: call [phone]  08/29/2014 12:43 AM

## 2014-08-29 NOTE — Progress Notes (Signed)
Subjective:  No CP/SOB. C/O foot pain, S/P RFPBG for limb salvage by DR Trula Slade  Objective:  Temp:  [96.7 F (35.9 C)-97.6 F (36.4 C)] 96.7 F (35.9 C) (12/08 0700) Pulse Rate:  [25-118] 77 (12/08 0830) Resp:  [12-25] 15 (12/08 0830) BP: (77-143)/(18-93) 124/44 mmHg (12/08 0830) SpO2:  [81 %-100 %] 100 % (12/08 0830) Weight:  [108 lb 11 oz (49.3 kg)] 108 lb 11 oz (49.3 kg) (12/08 0645) Weight change:   Intake/Output from previous day: 12/07 0701 - 12/08 0700 In: 1597.5 [P.O.:480; I.V.:447.5; Blood:670] Out: -   Intake/Output from this shift: Total I/O In: 100 [P.O.:100] Out: -   Physical Exam: General appearance: alert, no distress and slowed mentation Neck: no adenopathy, no carotid bruit, no JVD, supple, symmetrical, trachea midline and thyroid not enlarged, symmetric, no tenderness/mass/nodules Lungs: clear to auscultation bilaterally Heart: regular rate and rhythm, S1, S2 normal, no murmur, click, rub or gallop Extremities: Gangrranous toes  Lab Results: Results for orders placed or performed during the hospital encounter of 08/16/14 (from the past 48 hour(s))  CBC     Status: Abnormal   Collection Time: 08/27/14 12:54 PM  Result Value Ref Range   WBC 7.1 4.0 - 10.5 K/uL   RBC 2.96 (L) 4.22 - 5.81 MIL/uL   Hemoglobin 8.3 (L) 13.0 - 17.0 g/dL   HCT 24.5 (L) 39.0 - 52.0 %   MCV 82.8 78.0 - 100.0 fL   MCH 28.0 26.0 - 34.0 pg   MCHC 33.9 30.0 - 36.0 g/dL   RDW 16.5 (H) 11.5 - 15.5 %   Platelets 121 (L) 150 - 400 K/uL  APTT Once     Status: None   Collection Time: 08/27/14 12:54 PM  Result Value Ref Range   aPTT 37 24 - 37 seconds    Comment:        IF BASELINE aPTT IS ELEVATED, SUGGEST PATIENT RISK ASSESSMENT BE USED TO DETERMINE APPROPRIATE ANTICOAGULANT THERAPY.   Protime-INR Once     Status: Abnormal   Collection Time: 08/27/14 12:54 PM  Result Value Ref Range   Prothrombin Time 15.6 (H) 11.6 - 15.2 seconds   INR 1.23 0.00 - 1.49  CBC      Status: Abnormal   Collection Time: 08/27/14  8:20 PM  Result Value Ref Range   WBC 12.0 (H) 4.0 - 10.5 K/uL   RBC 3.78 (L) 4.22 - 5.81 MIL/uL   Hemoglobin 10.6 (L) 13.0 - 17.0 g/dL    Comment: DELTA CHECK NOTED REPEATED TO VERIFY    HCT 31.2 (L) 39.0 - 52.0 %   MCV 82.5 78.0 - 100.0 fL   MCH 28.0 26.0 - 34.0 pg   MCHC 34.0 30.0 - 36.0 g/dL   RDW 16.5 (H) 11.5 - 15.5 %   Platelets 138 (L) 150 - 400 K/uL  CBC     Status: Abnormal   Collection Time: 08/28/14  3:41 AM  Result Value Ref Range   WBC 11.0 (H) 4.0 - 10.5 K/uL   RBC 3.70 (L) 4.22 - 5.81 MIL/uL   Hemoglobin 10.3 (L) 13.0 - 17.0 g/dL   HCT 30.5 (L) 39.0 - 52.0 %   MCV 82.4 78.0 - 100.0 fL   MCH 27.8 26.0 - 34.0 pg   MCHC 33.8 30.0 - 36.0 g/dL   RDW 16.7 (H) 11.5 - 15.5 %   Platelets 157 150 - 400 K/uL  CBC     Status: Abnormal   Collection Time: 08/28/14  6:49 PM  Result Value Ref Range   WBC 16.7 (H) 4.0 - 10.5 K/uL   RBC 3.57 (L) 4.22 - 5.81 MIL/uL   Hemoglobin 9.7 (L) 13.0 - 17.0 g/dL   HCT 29.2 (L) 39.0 - 52.0 %   MCV 81.8 78.0 - 100.0 fL   MCH 27.2 26.0 - 34.0 pg   MCHC 33.2 30.0 - 36.0 g/dL   RDW 17.0 (H) 11.5 - 15.5 %   Platelets 221 150 - 400 K/uL    Comment: REPEATED TO VERIFY  CBC     Status: Abnormal   Collection Time: 08/28/14  9:33 PM  Result Value Ref Range   WBC 20.6 (H) 4.0 - 10.5 K/uL   RBC 2.94 (L) 4.22 - 5.81 MIL/uL   Hemoglobin 8.3 (L) 13.0 - 17.0 g/dL   HCT 24.7 (L) 39.0 - 52.0 %   MCV 84.0 78.0 - 100.0 fL   MCH 28.2 26.0 - 34.0 pg   MCHC 33.6 30.0 - 36.0 g/dL   RDW 17.3 (H) 11.5 - 15.5 %   Platelets 253 150 - 400 K/uL  I-STAT 3, arterial blood gas (G3+)     Status: Abnormal   Collection Time: 08/29/14  2:24 AM  Result Value Ref Range   pH, Arterial 7.372 7.350 - 7.450   pCO2 arterial 44.4 35.0 - 45.0 mmHg   pO2, Arterial 32.0 (LL) 80.0 - 100.0 mmHg   Bicarbonate 26.0 (H) 20.0 - 24.0 mEq/L   TCO2 27 0 - 100 mmol/L   O2 Saturation 61.0 %   Patient temperature 97.4 F    Sample  type ARTERIAL    Comment NOTIFIED PHYSICIAN   CBC     Status: Abnormal   Collection Time: 08/29/14  5:20 AM  Result Value Ref Range   WBC 18.4 (H) 4.0 - 10.5 K/uL   RBC 3.47 (L) 4.22 - 5.81 MIL/uL   Hemoglobin 10.1 (L) 13.0 - 17.0 g/dL    Comment: REPEATED TO VERIFY POST TRANSFUSION SPECIMEN    HCT 29.6 (L) 39.0 - 52.0 %   MCV 85.3 78.0 - 100.0 fL   MCH 29.1 26.0 - 34.0 pg   MCHC 34.1 30.0 - 36.0 g/dL   RDW 16.2 (H) 11.5 - 15.5 %   Platelets 123 (L) 150 - 400 K/uL    Comment: REPEATED TO VERIFY DELTA CHECK NOTED   Comprehensive metabolic panel     Status: Abnormal   Collection Time: 08/29/14  5:20 AM  Result Value Ref Range   Sodium 137 137 - 147 mEq/L   Potassium 4.9 3.7 - 5.3 mEq/L   Chloride 101 96 - 112 mEq/L   CO2 24 19 - 32 mEq/L   Glucose, Bld 122 (H) 70 - 99 mg/dL   BUN 35 (H) 6 - 23 mg/dL   Creatinine, Ser 6.69 (H) 0.50 - 1.35 mg/dL   Calcium 7.5 (L) 8.4 - 10.5 mg/dL   Total Protein 4.6 (L) 6.0 - 8.3 g/dL   Albumin 1.7 (L) 3.5 - 5.2 g/dL   AST 34 0 - 37 U/L   ALT 14 0 - 53 U/L   Alkaline Phosphatase 108 39 - 117 U/L   Total Bilirubin 1.2 0.3 - 1.2 mg/dL   GFR calc non Af Amer 8 (L) >90 mL/min   GFR calc Af Amer 10 (L) >90 mL/min    Comment: (NOTE) The eGFR has been calculated using the CKD EPI equation. This calculation has not been validated in all clinical situations. eGFR's persistently <  90 mL/min signify possible Chronic Kidney Disease.    Anion gap 12 5 - 15  Protime-INR     Status: None   Collection Time: 08/29/14  5:20 AM  Result Value Ref Range   Prothrombin Time 15.2 11.6 - 15.2 seconds   INR 1.19 0.00 - 1.49  Troponin I     Status: None   Collection Time: 08/29/14  5:20 AM  Result Value Ref Range   Troponin I <0.30 <0.30 ng/mL    Comment:        Due to the release kinetics of cTnI, a negative result within the first hours of the onset of symptoms does not rule out myocardial infarction with certainty. If myocardial infarction is still  suspected, repeat the test at appropriate intervals.   Lactic acid, plasma     Status: None   Collection Time: 08/29/14  5:40 AM  Result Value Ref Range   Lactic Acid, Venous 0.9 0.5 - 2.2 mmol/L    Imaging: Imaging results have been reviewed  Tele- NSR 80s  Assessment/Plan:   1. Active Problems: 2.   Peripheral vascular disease 3.   CAD (coronary artery disease), native coronary artery 4.   Essential hypertension 5.   Acute GI hemorrhage 6.   Abdominal pain, left lower quadrant 7.   ESRD on dialysis 8.   Hemorrhagic shock 9.   Acute GI bleeding 10.   Ischemic colitis 11.   Time Spent Directly with Patient:  30 minutes  Length of Stay:  LOS: 13 days   Pt well known to [place]. Pt of Dr. Evette Georges with CM, EF has been as low as 40-45%, but higher by 2D. S/P RCA roto/DES by Dr Jearld Lesch 4/15 on DAPT. H/O CLI s/p multiple endovascular procedures which subsequently failed followed by RFPBG. 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 He has been off of DAPT for surgery and was transferred back to ICU for hypotension thought to be secondary to hypovolemia. S/P TX 2 units PRBCs and is on Neo. SBP 120s, NSR. Denies CP.At this point there are no active CV problems. No CP. I'm concerned about being off of DAPT 8 months after a DES but clearly if there is a concern about GIB this would take precedence. Nothing further to add. Wean pressors as tolerated. Follow HGB. Please call if pt has recurrent CV issues.   Lorretta Harp 08/29/2014, 9:13 AM

## 2014-08-29 NOTE — Procedures (Addendum)
Central Venous Catheter Insertion Procedure Note JAHEEM HEDGEPATH [id number] 07-03-1961  Procedure: Insertion of Central Venous Catheter Indications: Assessment of intravascular volume, Drug and/or fluid administration and Frequent blood sampling  Procedure Details Consent: Risks of procedure as well as the alternatives and risks of each were explained to the (patient/caregiver).  Consent for procedure obtained. Time Out: Verified patient identification, verified procedure, site/side was marked, verified correct patient position, special equipment/implants available, medications/allergies/relevent history reviewed, required imaging and test results available.  Performed  Maximum sterile technique was used including antiseptics, cap, gloves, gown, hand hygiene, mask and sheet. Skin prep: Chlorhexidine; local anesthetic administered A antimicrobial bonded/coated triple lumen catheter was placed in the left internal jugular vein using the Seldinger technique. Ultrasound guidance used.Yes.   Catheter placed to 17 cm because resistance was met during insertion at that depth.  Blood aspirated via all 3 ports and then flushed x 3. Line sutured x 2 and dressing applied.  Evaluation Blood flow good Complications: No apparent complications Patient did tolerate procedure well. Chest X-ray ordered to verify placement.  CXR: Appears as though CVL tip terminates in aortic arch. However during placement of the line blood flow was non pulsatile. [place] visualized guidewire in compressible vessel on both sagittal and longitudinal axes. Blood was also dark in appearance and did not resemble arterial blood. Will send ABG to determine placement as well.     Georgann Housekeeper, AGACNP-BC [facility] Pulmonology/Critical Care Pager [phone] or [phone]

## 2014-08-29 NOTE — Progress Notes (Signed)
   [facility]  Subjective  - He has had clear liquids and tolerated that well.     Objective 127/51 83 97.3 F (36.3 C) (Oral) 17 100%  Intake/Output Summary (Last 24 hours) at 08/29/14 0717 Last data filed at 08/29/14 0600  Gross per 24 hour  Intake 1597.5 ml  Output      0 ml  Net 1597.5 ml    Vasc: DP doppler bilateral  Duskiness of right great toe, 2nd and 3rd toes. Malodorous drainage of left great toe with dry gangrene.  No change. Positive BS Heart RRR  Assessment/Planning: POD # s/p: Right femoral to below knee popliteal artery bypass graft with 6 mm external ring propatent PTFE 11 Days Post-Op  Left thigh shuntogram with venoplasty and stenting of left common femoral vain.  5 Days Post-Op  We will continue to observe his feet for ischemic changes.  He may need surgical intervention in the future. HGB stable at 10.1  [place], [facility] 08/29/2014 7:17 AM --  Laboratory Lab Results:  Recent Labs  08/28/14 2133 08/29/14 0520  WBC 20.6* 18.4*  HGB 8.3* 10.1*  HCT 24.7* 29.6*  PLT 253 123*   BMET  Recent Labs  08/26/14 0840 08/29/14 0520  NA 138 137  K 4.1 4.9  CL 96 101  CO2 26 24  GLUCOSE 98 122*  BUN 33* 35*  CREATININE 7.10* 6.69*  CALCIUM 8.4 7.5*    COAG Lab Results  Component Value Date   INR 1.19 08/29/2014   INR 1.23 08/27/2014   INR 1.68* 08/26/2014   No results found for: PTT

## 2014-08-29 NOTE — Progress Notes (Signed)
Concern from radiology that cvl might not be in vein  - d/w PA : at bedside high confidence on [place] that cvl is in vein  - ABG PO2 is 30s   - CVL in vein - high pretest prob   - ok to use cvl   Others  - still low bp  - contnue neo and prbc  - monitor  Dr. Brand Males, M.D., [facility]C.P Pulmonary and Critical Care Medicine Staff Physician [facility] and [facility] Pager: [phone], If no answer or between  15:00h - 7:00h: call [phone]  08/29/2014 2:37 AM

## 2014-08-29 NOTE — Progress Notes (Signed)
PULMONARY / CRITICAL CARE MEDICINE   Name: [name] MRN: [id number] DOB: 1961/06/19    ADMISSION DATE:  08/16/2014 CONSULTATION DATE:  08/29/2014  REFERRING MD :  Trula Slade  CHIEF COMPLAINT:  Bright red blood per rectum  INITIAL PRESENTATION: 53 year old man with ESRD on HD, known diverticulosis ,severe PAD status post right femoropopliteal bypass 11/25 and venoplasty of left femoral vein 12/3 transferred to the ICU on 12/5 for lower GI bleed. 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 He is maintained on dialysis after failed renal transplant. He has undergone multiple interventions for severe peripheral arterial disease.  Then developed painless bright red blood per rectum. Subsequently he developed periumbilical cramping pain. He also has a history of colonic perforation with colostomy takedown in 2004. Prior colonoscopy is apparently shown sigmoid diverticulosis. This seemingly resolved and he was transferred out of ICU, but on 12/7 PM clots PR recurred. PCCM consulted for eval There is no history of prior GI bleeding   PAST MEDICAL HISTORY :   has a past medical history of ESRD on hemodialysis; Hypertension; kidney transplant; Gout tophi; Chronic steroid use; Systolic CHF, chronic; Malnutrition; SBO (04/2012); Anemia associated with chronic renal failure; Hepatitis C; GERD (gastroesophageal reflux disease); H/O hiatal hernia; Arthritis; History of DVT (deep  vein thrombosis); Peripheral vascular disease; Prostate cancer; CAD (coronary artery disease); Chronic diastolic CHF (congestive heart failure); and Dry gangrene.  has past surgical history that includes laparotomy (04/14/2012); Colon resection (04/14/2012); laparotomy (04/22/2012); Thrombectomy w/ embolectomy (04/27/2012); prostectomy (2011); renal grafts; Pelvic abcess drainage (Right, 6/14); Transanal excision of rectal mass (N/A, 03/30/2013); Aortogram (08/15/2014); and Femoral-popliteal Bypass Graft (Right, 08/18/2014). Prior to Admission medications   Medication Sig Start Date End Date Taking? Authorizing Provider  amLODipine (NORVASC) 5 MG tablet Take 5 mg by mouth daily.  07/26/14  Yes Historical Provider, MD  aspirin EC 81 MG tablet Take 81 mg by mouth daily.   Yes Historical Provider, MD  atorvastatin (LIPITOR) 40 MG tablet TAKE 1 TABLET BY MOUTH DAILY AT 6 PM 08/14/14  Yes Troy Sine, MD  calcium acetate (PHOSLO) 667 MG tablet Take 2,001 mg by mouth 3 (three) times daily.  01/24/13  Yes Historical Provider, MD  carvedilol (COREG) 6.25 MG tablet TAKE 1 TABLET BY MOUTH TWICE DAILY WITH MEALS 08/14/14  Yes Troy Sine, MD  cinacalcet (SENSIPAR) 60 MG tablet Take 60 mg by mouth daily.   Yes Historical Provider, MD  clopidogrel (PLAVIX) 75 MG tablet Take 1 tablet (75 mg total) by mouth daily with breakfast. 01/14/14  Yes Lendon Colonel, NP  COLCRYS 0.6 MG tablet Take 0.6 mg by mouth daily.  01/04/13  Yes Historical Provider, MD  HYDROmorphone (DILAUDID) 2 MG tablet Take 1 to 2 tablets by mouth every 6 hours as needed for postoperative pain. Patient taking differently: Take 1-2 mg by mouth every 6 (six) hours as needed (pain).  08/07/14  Yes Serafina Mitchell, MD  LIDOCAINE EX Apply 1 application topically See admin instructions. Apply  to inject site on dialysis days (Tues, Thur, Sat) if needed for pain   Yes Historical Provider, MD  loperamide (IMODIUM A-D) 2 MG tablet Take 2 mg by mouth daily as  needed for diarrhea or loose stools.  08/13/12  Yes Erroll Luna, MD  nitroGLYCERIN (NITRODUR - DOSED IN MG/24 HR) 0.3 mg/hr patch Place 1 patch (0.3 mg total) onto the skin daily. 01/14/14  Yes Lendon Colonel, NP  oxyCODONE (ROXICODONE) 5 MG immediate release tablet Take 1 tablet (5 mg total) by mouth every 4 (four) hours as needed for severe pain. 08/02/14  Yes Serafina Mitchell, MD  predniSONE (DELTASONE) 5 MG tablet Take 5 mg by mouth daily.   Yes Historical Provider, MD  multivitamin (RENA-VIT) TABS tablet Take 1 tablet by mouth daily. 07/05/12   Rosalia Hammers, MD  pantoprazole (PROTONIX) 40 MG tablet TAKE 1 TABLET BY MOUTH DAILY 08/26/14   Troy Sine, MD   Allergies  Allergen Reactions  . Allopurinol Other (See Comments)    REACTION: decreased platelets    FAMILY HISTORY:  indicated that his mother is deceased. He indicated that his father is deceased. He indicated that his sister is alive. He indicated that his brother is deceased.  SOCIAL HISTORY:  reports that he quit smoking about 41 years ago. His smoking use included Cigarettes. He has a 10 pack-year smoking history. He has never used smokeless tobacco. He reports that he does not drink alcohol or use illicit drugs.   SUBJECTIVE:   VITAL SIGNS: Temp:  [97.3 F (36.3 C)-98.8 F (37.1 C)] 97.3 F (36.3 C) (12/08 0000) Pulse Rate:  [25-91] 25 (12/08 0030) Resp:  [12-26] 18 (12/08 0030) BP: (77-168)/(28-139) 90/31 mmHg (12/08 0030) SpO2:  [81 %-100 %] 93 % (12/08 0030) HEMODYNAMICS:   VENTILATOR SETTINGS:   INTAKE / OUTPUT:  Intake/Output Summary (Last 24 hours) at 08/29/14 0111 Last data filed at 08/28/14 1300  Gross per 24 hour  Intake    480 ml  Output      0 ml  Net    480 ml    PHYSICAL EXAMINATION:  General:  Chronically ill-appearing, moderately nourished Neuro:  Nonfocal HEENT:  Pale, no icterus Cardiovascular:  Ejection systolic murmur 2/6 at apex, tachycardia Lungs:  Clear Abdomen:  Soft,  midline scar, right groin recent incision healing well Musculoskeletal:  Good peripheral pulses, no edema, BUE grafts, RUE good pulse Skin:  Dry gangrene right toes  LABS:  CBC  Recent Labs Lab 08/28/14 0341 08/28/14 1849 08/28/14 2133  WBC 11.0* 16.7* 20.6*  HGB 10.3* 9.7* 8.3*  HCT 30.5* 29.2* 24.7*  PLT 157 221 253   Coag's  Recent Labs Lab 08/26/14 1730 08/27/14 1254  APTT 42* 37  INR 1.68* 1.23   BMET  Recent Labs Lab 08/22/14 1145 08/24/14 1700 08/26/14 0840  NA 135* 136* 138  K 4.8 5.0 4.1  CL 91* 93* 96  CO2 24 19 26   BUN 68* 54* 33*  CREATININE 10.49* 9.22*  9.22* 7.10*  GLUCOSE 115* 63* 98   Electrolytes  Recent Labs Lab 08/22/14 1145 08/24/14 1700 08/26/14 0840  CALCIUM 8.5 8.6 8.4  PHOS 5.3* 5.4* 4.5   Sepsis Markers No results for input(s): LATICACIDVEN, PROCALCITON, O2SATVEN in the last 168 hours. ABG No results for input(s): PHART, PCO2ART, PO2ART in the last 168 hours. Liver Enzymes  Recent Labs Lab 08/22/14 1145 08/24/14 1700 08/26/14 0840  ALBUMIN 2.0* 1.9* 1.8*   Cardiac Enzymes No results for input(s):  TROPONINI, PROBNP in the last 168 hours. Glucose No results for input(s): GLUCAP in the last 168 hours.  Imaging No results found.   ASSESSMENT / PLAN:  PULMONARY  A:  No acute issues  P:   Supplemental O2 Monitor  CARDIOVASCULAR CVL [facility] 12/8 >>> A:  Hemorrhagic shock Known CAD , status post DES 12/2013 Severe PVD H/o HTN  P:  Volume resuscitation with IVF and blood products as below.   Insert CVL and follow CVP Hold aspirin and Plavix  Hold coreg and amlodipine  Hold NTG patch Chronically on steroids, will start stress dose steroids.  RENAL A:  ESRD on HD Left thigh shunt  P:   Management per nephrology  GASTROINTESTINAL A:   LGIB - diverticular source most likely , differential includes ischemic colitis   P:   GI following, have been contacted 12/7 PM by Warren Lacy MD  Dr. Henrene Pastor to see  today.  HEMATOLOGIC A:   Acute blood loss anemia Hepatitis C. P:  Transfuse 2 unit PRBC, aim for hemoglobin 8 and above Check serial hemoglobin Currently holding ASA, Plavix INR ok.  INFECTIOUS A:   No issues  P:   Monitor clinically  ENDOCRINE A:    Hx Gout on chronic prednisone  P:  Stress dose steroids  NEUROLOGIC A:   Chronic pain  P:   PRN morphine, oxycodone for pain control  FAMILY  - Updates: pt updated 12/8  Georgann Housekeeper, ACNP [place] Pulmonology/Critical Care Pager [phone] or [phone]  Above note edited in full.  Will continue neo for now, hold in the ICU given pressor need.  GI to see this AM (Dr. Henrene Pastor notified).  Start stress dose steroids.  Hold all anti-HTN.  Serial H&Hs.  Transfuse as needed.  The patient is critically ill with multiple organ systems failure and requires high complexity decision making for assessment and support, frequent evaluation and titration of therapies, application of advanced monitoring technologies and extensive interpretation of multiple databases.   Critical Care Time devoted to patient care services described in this note is  35  Minutes. This time reflects time of care of this signee Dr Jennet Maduro. This critical care time does not reflect procedure time, or teaching time or supervisory time of PA/NP/Med student/Med Resident etc but could involve care discussion time.  Rush Farmer, M.D. [facility] Pulmonary/Critical Care Medicine. Pager: [phone]. After hours pager: [phone].

## 2014-08-29 NOTE — Progress Notes (Signed)
Daily Rounding Note  08/29/2014, 8:49 AM  LOS: 13 days   SUBJECTIVE:       Passing large clots of blood last night through early AM, associated with abdominal cramping/incontinence.  Bleeding stopped for now.  Pain resolved but again feels bloated "tight".  No nausea but + early satiety.   BP 80s/40s. MAP 52.Felt light headed. Given fluid bolus. Got PRBCs x   Stress dose steroids initiated (on chronic prednisone), BP/heart meds/nicotine patch held.  Central venous line placed.    OBJECTIVE:         Vital signs in last 24 hours:    Temp:  [96.7 F (35.9 C)-97.6 F (36.4 C)] 96.7 F (35.9 C) (12/08 0700) Pulse Rate:  [25-118] 77 (12/08 0830) Resp:  [12-25] 15 (12/08 0830) BP: (77-152)/(18-139) 124/44 mmHg (12/08 0830) SpO2:  [81 %-100 %] 100 % (12/08 0830) Weight:  [108 lb 11 oz (49.3 kg)] 108 lb 11 oz (49.3 kg) (12/08 0645) Last BM Date: 08/28/14 Filed Weights   08/26/14 0830 08/26/14 1201 08/29/14 0645  Weight: 102 lb 15.3 oz (46.7 kg) 101 lb 13.6 oz (46.2 kg) 108 lb 11 oz (49.3 kg)   General: smells of necrotic tissue.  Ill appearing.  Comfortable.  Heart: RRR Chest: clear in front.  No labored breathing Abdomen: tight, not distended.  Minimal if any tenderness on left  Extremities: dusky toes on right, malodorous left hallux  With dry gangrene.  Neuro/Psych:  Oriented x 3.  Relaxed.  Appropriate.   Intake/Output from previous day: 12/07 0701 - 12/08 0700 In: 1597.5 [P.O.:480; I.V.:447.5; Blood:670] Out: -   Intake/Output this shift: Total I/O In: 100 [P.O.:100] Out: -   Lab Results:  Recent Labs  08/28/14 1849 08/28/14 2133 08/29/14 0520  WBC 16.7* 20.6* 18.4*  HGB 9.7* 8.3* 10.1*  HCT 29.2* 24.7* 29.6*  PLT 221 253 123*   BMET  Recent Labs  08/29/14 0520  NA 137  K 4.9  CL 101  CO2 24  GLUCOSE 122*  BUN 35*  CREATININE 6.69*  CALCIUM 7.5*   LFT  Recent Labs  08/29/14 0520  PROT  4.6*  ALBUMIN 1.7*  AST 34  ALT 14  ALKPHOS 108  BILITOT 1.2   PT/INR  Recent Labs  08/27/14 1254 08/29/14 0520  LABPROT 15.6* 15.2  INR 1.23 1.19   Hepatitis Panel No results for input(s): HEPBSAG, HCVAB, HEPAIGM, HEPBIGM in the last 72 hours.  Studies/Results: Dg Chest Port 1 View  08/29/2014   CLINICAL DATA:  Central line placement.  EXAM: PORTABLE CHEST - 1 VIEW  COMPARISON:  07/03/2012  FINDINGS: Interval placement of a left central venous catheter. Tip projects to the left mediastinum over the aortic arch. This could be in the brachiocephalic vein, in a normal variant left superior vena cava, or an arterial branch. No pneumothorax. Calcification in the left apex is likely postinflammatory. This is new since previous study. Heart size is normal. Hemidiaphragms are not included within the field of view.  IMPRESSION: Nonspecific placement of left central venous catheter to the left of midline, suggesting placement either in the brachiocephalic vein, left superior vena cava, or arterial branch. No pneumothorax.   Electronically Signed   By: Lucienne Capers M.D.   On: 08/29/2014 02:17   Scheduled Meds: . sodium chloride   Intravenous Once  . atorvastatin  40 mg Oral q1800  . calcitRIOL  2.75 mcg Oral Q T,Th,Sa-HD  . calcium acetate  2,001 mg Oral TID WC  . cinacalcet  60 mg Oral Daily  . colchicine  0.3 mg Oral Once per day on Mon Thu  . darbepoetin (ARANESP) injection - DIALYSIS  60 mcg Intravenous Q Sat-HD  . docusate sodium  100 mg Oral BID  . feeding supplement (RESOURCE BREEZE)  1 Container Oral TID BM  . hydrocortisone sodium succinate  50 mg Intravenous 4 times per day  . multivitamin  1 tablet Oral QHS  . pantoprazole  40 mg Oral Daily  . polyethylene glycol  17 g Oral Daily  . sodium chloride  3 mL Intravenous Q12H   Continuous Infusions: . sodium chloride Stopped (08/18/14 1430)  . phenylephrine (NEO-SYNEPHRINE) Adult infusion 40 mcg/min (08/29/14 0800)   PRN  Meds:.sodium chloride, sodium chloride, sodium chloride, acetaminophen **OR** acetaminophen, feeding supplement (NEPRO CARB STEADY), guaiFENesin-dextromethorphan, lidocaine (PF), lidocaine-prilocaine, metoprolol, morphine injection, ondansetron, oxyCODONE, pentafluoroprop-tetrafluoroeth, phenol, senna-docusate, sodium chloride   ASSESMENT:   * Hematochezia, no acute abdominal pain. CT scan most c/w ischemic colitis.? Diverticular bleed.  Constipated leading up to hematochezia.  Recurrent large volume bleeding overnight a/w hemorrhagic shock, central line placed and on Neo.  02/2010 screening colonoscopy: diverticulosis.    * Chronic Plavix. On hold. Coags normal.   * ABL on top of chronic anemia. On aranesp. S/p 9 PRBCs, last 2 on 12/8.  Hgb improved.   * Coagulopathy. S/p Vitamin K and FFP x 4 units. coags now normal. Not on Coumadin pta. .   * ESRD. On HD.   *  Hep C +.    * S/p 2 previous SB resections. S/p segmental colectomy for perforated diverticulits, colostomy ultimately reversed.   *  CAD.  3 drug eluting stents placed 8 months ago.  Per cardiology: at minimum needs ASA/Plavix x 12 months but  In his case this needs to be life long.   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 Ischemic R foot, s/p R fem-pop 11/27.  Ischemic L foot s/p L popliteal angioplasty x 2 per [facility] (April then October 2015).  "may need surgical intervention in the future"  Per Vascular surgery note today.  Plavix discontinued 12/5 (date of his last dose). On Plavix and low dose ASA PTA.    * Protein malnutrition.     PLAN   *  ? Colonoscopy?  Dr Henrene Pastor to decide.    Azucena Freed  08/29/2014, 8:49 AM Pager: [phone]  GI ATTENDING  PATIENT PERSONALLY SEEN AND EXAMINED. AGREE WITH ABOVE NOTE. FAMILY MEMBER IN ROOM. STABLE TODAY WITHOUT FURTHER BLEEDING. DIFFERENTIAL BX REMAINS ISCHEMIA VS DIVERTICULAR. BOTH ARE TREATED, FOR THE MOST PART,  WITH SUPPORTIVE CARE AND TIME.  EXAM BENIGN. WILL CONTINUE TO FOLLOW CLOSELY WITH YOU. NO PLANS FOR COLONOSCOPY AT PRESENT, BUT MAY CONSIDER IF SIGNIFICANT BLEEDING PERSISTS TO CLARIFY DX AND OTHER POTENTIAL TREATMENT OPTIONS.  Docia Chuck. Geri Seminole., M.D. [facility] Division of Gastroenterology

## 2014-08-29 NOTE — Progress Notes (Signed)
Pt's blood pressure dropping to 80s/40s. Pt complains of feeling light headed. CCM notified. Will give a 250 cc fluid bolus and start Neo gtt.  Vella Raring, RN

## 2014-08-30 DIAGNOSIS — K559 Vascular disorder of intestine, unspecified: Secondary | ICD-10-CM

## 2014-08-30 DIAGNOSIS — I96 Gangrene, not elsewhere classified: Secondary | ICD-10-CM

## 2014-08-30 LAB — CBC
HCT: 27.9 % — ABNORMAL LOW (ref 39.0–52.0)
HEMOGLOBIN: 9.5 g/dL — AB (ref 13.0–17.0)
MCH: 28.8 pg (ref 26.0–34.0)
MCHC: 34.1 g/dL (ref 30.0–36.0)
MCV: 84.5 fL (ref 78.0–100.0)
Platelets: 173 10*3/uL (ref 150–400)
RBC: 3.3 MIL/uL — ABNORMAL LOW (ref 4.22–5.81)
RDW: 16.4 % — ABNORMAL HIGH (ref 11.5–15.5)
WBC: 25.8 10*3/uL — ABNORMAL HIGH (ref 4.0–10.5)

## 2014-08-30 LAB — TYPE AND SCREEN
ABO/RH(D): B POS
ANTIBODY SCREEN: POSITIVE
DAT, IgG: NEGATIVE
DONOR AG TYPE: NEGATIVE
DONOR AG TYPE: NEGATIVE
DONOR AG TYPE: NEGATIVE
Donor AG Type: NEGATIVE
Donor AG Type: NEGATIVE
Donor AG Type: NEGATIVE
UNIT DIVISION: 0
UNIT DIVISION: 0
Unit division: 0
Unit division: 0
Unit division: 0
Unit division: 0

## 2014-08-30 LAB — BASIC METABOLIC PANEL
Anion gap: 13 (ref 5–15)
BUN: 19 mg/dL (ref 6–23)
CALCIUM: 8 mg/dL — AB (ref 8.4–10.5)
CO2: 26 meq/L (ref 19–32)
Chloride: 100 mEq/L (ref 96–112)
Creatinine, Ser: 4.08 mg/dL — ABNORMAL HIGH (ref 0.50–1.35)
GFR calc Af Amer: 18 mL/min — ABNORMAL LOW (ref >90)
GFR, EST NON AFRICAN AMERICAN: 15 mL/min — AB (ref >90)
Glucose, Bld: 124 mg/dL — ABNORMAL HIGH (ref 70–99)
Potassium: 4.4 mEq/L (ref 3.7–5.3)
SODIUM: 139 meq/L (ref 137–147)

## 2014-08-30 LAB — PHOSPHORUS: Phosphorus: 3.1 mg/dL (ref 2.3–4.6)

## 2014-08-30 LAB — MAGNESIUM: MAGNESIUM: 1.8 mg/dL (ref 1.5–2.5)

## 2014-08-30 MED ORDER — CARVEDILOL 6.25 MG PO TABS
6.2500 mg | ORAL_TABLET | Freq: Two times a day (BID) | ORAL | Status: DC
  Administered 2014-08-30 – 2014-09-04 (×6): 6.25 mg via ORAL
  Filled 2014-08-30 (×15): qty 1

## 2014-08-30 MED ORDER — HYDRALAZINE HCL 20 MG/ML IJ SOLN
10.0000 mg | INTRAMUSCULAR | Status: DC | PRN
  Administered 2014-08-30 (×2): 10 mg via INTRAVENOUS
  Administered 2014-08-30 – 2014-09-02 (×6): 20 mg via INTRAVENOUS
  Filled 2014-08-30 (×8): qty 1

## 2014-08-30 MED ORDER — AMLODIPINE BESYLATE 5 MG PO TABS
5.0000 mg | ORAL_TABLET | Freq: Every day | ORAL | Status: DC
  Administered 2014-08-30 – 2014-09-02 (×2): 5 mg via ORAL
  Filled 2014-08-30 (×5): qty 1

## 2014-08-30 MED ORDER — HYDRALAZINE HCL 20 MG/ML IJ SOLN
INTRAMUSCULAR | Status: AC
  Filled 2014-08-30: qty 1

## 2014-08-30 MED ORDER — PREDNISONE 5 MG PO TABS
5.0000 mg | ORAL_TABLET | Freq: Every day | ORAL | Status: DC
  Administered 2014-08-30 – 2014-09-05 (×6): 5 mg via ORAL
  Filled 2014-08-30 (×7): qty 1

## 2014-08-30 NOTE — Progress Notes (Signed)
Patient hypertensive despite scheduled norvasc given. Dr. Halford Chessman paged twice, unable to reach. Dr. Leonidas Romberg ordered prn hydralazine. See mar. Will continue to monitor.  Sandre Kitty

## 2014-08-30 NOTE — Progress Notes (Signed)
Daily Rounding Note  08/30/2014, 8:33 AM  LOS: 14 days   SUBJECTIVE:       On full liquids.  Less pressure in left abdomen.  Last bloody stool was early yest AM. Feels better.  Some pain in left toes/foot.    OBJECTIVE:         Vital signs in last 24 hours:    Temp:  [98.1 F (36.7 C)-98.9 F (37.2 C)] 98.7 F (37.1 C) (12/09 0715) Pulse Rate:  [60-109] 96 (12/09 0700) Resp:  [14-28] 28 (12/09 0700) BP: (81-194)/(29-161) 181/72 mmHg (12/09 0700) SpO2:  [75 %-100 %] 98 % (12/09 0700) Weight:  [109 lb 12.6 oz (49.8 kg)] 109 lb 12.6 oz (49.8 kg) (12/09 0057) Last BM Date: 08/28/14 Filed Weights   08/26/14 1201 08/29/14 0645 08/30/14 0057  Weight: 101 lb 13.6 oz (46.2 kg) 108 lb 11 oz (49.3 kg) 109 lb 12.6 oz (49.8 kg)   General: looks better, comfortable, alert   Heart: RRR Chest: clear bil  No cough or dyspnea Abdomen: soft, NT, BS active. ND  Extremities: gangrenous black toes on right.  Neuro/Psych:  Pleasant, oriented x 3.  No gross focal deficits.  Not somnolent.    Intake/Output from previous day: 12/08 0701 - 12/09 0700 In: 815 [P.O.:100; I.V.:715] Out: 0   Intake/Output this shift:    Lab Results:  Recent Labs  08/28/14 2133 08/29/14 0520 08/29/14 1600 08/30/14 0440  WBC 20.6* 18.4*  --  25.8*  HGB 8.3* 10.1* 9.7* 9.5*  HCT 24.7* 29.6* 28.4* 27.9*  PLT 253 123*  --  173   BMET  Recent Labs  08/29/14 0520 08/30/14 0440  NA 137 139  K 4.9 4.4  CL 101 100  CO2 24 26  GLUCOSE 122* 124*  BUN 35* 19  CREATININE 6.69* 4.08*  CALCIUM 7.5* 8.0*   LFT  Recent Labs  08/29/14 0520  PROT 4.6*  ALBUMIN 1.7*  AST 34  ALT 14  ALKPHOS 108  BILITOT 1.2   PT/INR  Recent Labs  08/27/14 1254 08/29/14 0520  LABPROT 15.6* 15.2  INR 1.23 1.19   Hepatitis Panel No results for input(s): HEPBSAG, HCVAB, HEPAIGM, HEPBIGM in the last 72 hours.  Studies/Results: Dg Chest Port 1  View  08/29/2014   CLINICAL DATA:  Central line placement.  EXAM: PORTABLE CHEST - 1 VIEW  COMPARISON:  07/03/2012  FINDINGS: Interval placement of a left central venous catheter. Tip projects to the left mediastinum over the aortic arch. This could be in the brachiocephalic vein, in a normal variant left superior vena cava, or an arterial branch. No pneumothorax. Calcification in the left apex is likely postinflammatory. This is new since previous study. Heart size is normal. Hemidiaphragms are not included within the field of view.  IMPRESSION: Nonspecific placement of left central venous catheter to the left of midline, suggesting placement either in the brachiocephalic vein, left superior vena cava, or arterial branch. No pneumothorax.   Electronically Signed   By: Lucienne Capers M.D.   On: 08/29/2014 02:17   Scheduled Meds: . atorvastatin  40 mg Oral q1800  . calcitRIOL  2.75 mcg Oral Q T,Th,Sa-HD  . calcium acetate  2,001 mg Oral TID WC  . cinacalcet  60 mg Oral Daily  . colchicine  0.3 mg Oral Once per day on Mon Thu  . darbepoetin (ARANESP) injection - DIALYSIS  60 mcg Intravenous Q Sat-HD  . docusate sodium  100 mg Oral BID  . feeding supplement (RESOURCE BREEZE)  1 Container Oral TID BM  . hydrocortisone sodium succinate  50 mg Intravenous 4 times per day  . multivitamin  1 tablet Oral QHS  . pantoprazole  40 mg Oral Daily  . polyethylene glycol  17 g Oral Daily  . sodium chloride  3 mL Intravenous Q12H   Continuous Infusions: . sodium chloride Stopped (08/18/14 1430)  . phenylephrine (NEO-SYNEPHRINE) Adult infusion Stopped (08/29/14 1600)   PRN Meds:.sodium chloride, sodium chloride, sodium chloride, acetaminophen **OR** acetaminophen, feeding supplement (NEPRO CARB STEADY), guaiFENesin-dextromethorphan, lidocaine (PF), lidocaine-prilocaine, metoprolol, morphine injection, ondansetron, oxyCODONE, pentafluoroprop-tetrafluoroeth, phenol, senna-docusate, sodium  chloride   ASSESMENT:   * Hematochezia, no acute abdominal pain. CT scan most c/w ischemic colitis.this is likely source of the leukocytosis.  ? Diverticular bleed? Constipated leading up to hematochezia.  Recurrent large volume bleeding 12/7-12/8 central line placed.  Neo initiated but now off and BPs in hypertensive range 02/2010 screening colonoscopy: diverticulosis.  Despite rise in WBC count, clinically is improving.   * Chronic Plavix. On hold. Coags normal.   * ABL on top of chronic anemia. On aranesp. S/p 9 PRBCs, last 2 on 12/8. Hgb improved.   * Coagulopathy. S/p Vitamin K and FFP x 4 units. coags now normal. Not on Coumadin pta. .   * ESRD. On HD TTS.   * Hep C +. No cirrhosis on CT 08/27/14.  * S/p 2 previous SB resections. S/p segmental colectomy for perforated diverticulits, colostomy ultimately reversed.   * CAD. 3 drug eluting stents placed 8 months ago. Per cardiology: at minimum needs ASA/Plavix x 12 months but In his case this needs to be life long.   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 Ischemic R foot, s/p R fem-pop 11/27. Ischemic L foot s/p L popliteal angioplasty x 2 per [facility] (April then October 2015). "may need surgical intervention in the future" Per Vascular surgery note today.  Plavix discontinued 12/5 (date of his last dose). On Plavix and low dose ASA PTA.   * Protein malnutrition.    PLAN   *  Advance to renal diet.  *  ? When to resume Plavix, ASA?      Azucena Freed  08/30/2014, 8:33 AM Pager: [phone]  GI ATTENDING  Interval history and data reviewed. Agree with interval progress note as above. Patient seen and examined. No significant bleeding since last seen. Continue to monitor blood counts and stools. We'll follow.  Docia Chuck. Geri Seminole., M.D. [facility] Division of Gastroenterology

## 2014-08-30 NOTE — Progress Notes (Signed)
PULMONARY / CRITICAL CARE MEDICINE   Name: [name] MRN: [id number] DOB: 03-Jun-1961    ADMISSION DATE:  08/16/2014 CONSULTATION DATE:  08/30/2014  REFERRING MD :  Trula Slade  CHIEF COMPLAINT:  Bright red blood per rectum  INITIAL PRESENTATION:  53 yo male with hx of diverticulosis transferred to ICU with lower GI bleeding.  He has hx of PVD s/p Rt fem-pop bypass 11/25 and venoplasty Lt femoral vein 12/03, CAD s/p DES April 2015 on ASA and plavix.  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  STUDIES:  12/02 LT leg doppler >> no DVT 12/06 CT abd >> soft tissue inflammation at distal sigmoid/rectum  SIGNIFICANT EVENTS: 12/05 LGI bleed, to ICU, GI consulted 12/07 Cardiology consulted 12/08 Back to ICU with LGI bleed, transfuse 2 units PRBC added pressors 12/09 Off pressors  SUBJECTIVE:  Feels better.  Denies chest/abd pain.  No further episodes of LGI bleeding.  VITAL SIGNS: Temp:  [98.1 F (36.7 C)-98.9 F (37.2 C)] 98.7 F (37.1 C) (12/09 0715) Pulse Rate:  [60-109] 96 (12/09 0800) Resp:  [14-28] 23 (12/09 0800) BP: (81-194)/(36-161) 174/71 mmHg (12/09 0800) SpO2:  [75 %-100 %] 100 % (12/09 0800) Weight:  [109 lb 12.6 oz (49.8 kg)] 109 lb 12.6 oz (49.8 kg) (12/09 0057) INTAKE / OUTPUT:  Intake/Output Summary (Last 24 hours) at 08/30/14 0954 Last data filed at 08/30/14 0800  Gross per 24 hour  Intake 595.01 ml  Output      0 ml  Net 595.01 ml    PHYSICAL EXAMINATION: General: no distress Neuro: follows commands HEENT: no sinus tenderness Cardiovascular: regular Lungs: no wheeze Abdomen: soft, non tender Musculoskeletal: no edema, decreased muscle bulk Skin:  Dry gangrene right toes  LABS:  CBC  Recent Labs Lab 08/28/14 2133 08/29/14 0520 08/29/14 1600 08/30/14 0440  WBC 20.6* 18.4*  --  25.8*  HGB 8.3* 10.1* 9.7* 9.5*  HCT 24.7* 29.6* 28.4* 27.9*  PLT 253 123*  --  173   Coag's  Recent Labs Lab 08/26/14 1730 08/27/14 1254 08/29/14 0520  APTT  42* 37  --   INR 1.68* 1.23 1.19   BMET  Recent Labs Lab 08/26/14 0840 08/29/14 0520 08/30/14 0440  NA 138 137 139  K 4.1 4.9 4.4  CL 96 101 100  CO2 26 24 26   BUN 33* 35* 19  CREATININE 7.10* 6.69* 4.08*  GLUCOSE 98 122* 124*   Electrolytes  Recent Labs Lab 08/24/14 1700 08/26/14 0840 08/29/14 0520 08/30/14 0440  CALCIUM 8.6 8.4 7.5* 8.0*  MG  --   --   --  1.8  PHOS 5.4* 4.5  --  3.1   Liver Enzymes  Recent Labs Lab 08/24/14 1700 08/26/14 0840 08/29/14 0520  AST  --   --  34  ALT  --   --  14  ALKPHOS  --   --  108  BILITOT  --   --  1.2  ALBUMIN 1.9* 1.8* 1.7*    Imaging Dg Chest Port 1 View  08/29/2014   CLINICAL DATA:  Central line placement.  EXAM: PORTABLE CHEST - 1 VIEW  COMPARISON:  07/03/2012  FINDINGS: Interval placement of a left central venous catheter. Tip projects to the left mediastinum over the aortic arch. This could be in the brachiocephalic vein, in a normal variant left superior vena cava, or an arterial branch. No pneumothorax. Calcification in the left apex is likely postinflammatory. This is new since previous study. Heart size is normal.  Hemidiaphragms are not included within the field of view.  IMPRESSION: Nonspecific placement of left central venous catheter to the left of midline, suggesting placement either in the brachiocephalic vein, left superior vena cava, or arterial branch. No pneumothorax.   Electronically Signed   By: Lucienne Capers M.D.   On: 08/29/2014 02:17     ASSESSMENT / PLAN: Lt IJ CVL 12/08 >>  Hemorrhagic shock 2nd to LGI bleed >> resolved 12/09. Plan: Keep CVL in place for now F/u Hb >> transfuse for bleeding or Hb < 7 D/c solu cortef 12/09  LGI bleed possibly from ischemic colitis in setting of diverticulosis >> no plans for colonoscopy unless bleeding recurs. Plan: F/u with GI  Severe protein calorie malnutrition. Plan: Renal diet  Hx of ESRD on HD. Plan: Per renal  Hx of Gout on chronic  prednisone. Plan: Transition back to prednisone 12/09  Hx of CAD s/p DES in April 2015. PVD. Hx of HTN, HLD. Plan: Will need to determine when to resume ASA, plavix Resume amlodipine, coreg 12/09 Continue lipitor Post op care per [facility]  Deconditioning. Plan: PT/OT  SUMMARY: No further bleeding.  Will change to SDU status.  Will ask [facility] to assume care from 12/10 and PCCM sign off.   Chesley Mires, MD [facility] Pulmonary/Critical Care 08/30/2014, 9:54 AM Pager:  [phone] After 3pm call: [phone]

## 2014-08-30 NOTE — Progress Notes (Signed)
Assessment: 1. Acute GI bleed - recurrent Per GI. Possible colonscopy 2. Ischemic R foot, s/p R fem-pop 11/27 3. Ischemic L foot s/p L popliteal angioplasty x 2 per [facility] (April then October 2015) 4. Left leg edema - resolved, s/p venoplasty / stenting of left CF vein 09/03/14 5. ESRD on HD - no hep HD for now given GIB,TTS-In AM 6. HTN/volume - BP up after blood products, will resume low dose norvasc 2.5 bid. At dry wt.  7. Anemia ^'d aranesp to 60/wk, cont IV Fe 8. Sec HPTH pth 713, cont vit D, sensipar, phoslo 9. Hx CAD s/p PCI/ stents - on NTG patch    Subjective: Interval History: Abd feels better  Objective: Vital signs in last 24 hours: Temp:  [98.1 F (36.7 C)-98.9 F (37.2 C)] 98.4 F (36.9 C) (12/09 1158) Pulse Rate:  [60-109] 91 (12/09 1400) Resp:  [14-28] 16 (12/09 1400) BP: (113-225)/(65-161) 193/95 mmHg (12/09 1400) SpO2:  [75 %-100 %] 99 % (12/09 1400) Weight:  [49.8 kg (109 lb 12.6 oz)] 49.8 kg (109 lb 12.6 oz) (12/09 0057) Weight change: 0.5 kg (1 lb 1.6 oz)  Intake/Output from previous day: 12/08 0701 - 12/09 0700 In: 825 [P.O.:100; I.V.:725] Out: 0  Intake/Output this shift: Total I/O In: 310 [P.O.:240; I.V.:70] Out: -   General appearance: alert, cooperative and appears stated age Chest wall: no tenderness Cardio: regular rate and rhythm, S1, S2 normal, no murmur, click, rub or gallop GI: soft, non-tender; bowel sounds normal; no masses,  no organomegaly Extremities: ischemic toes and post op leg  Lab Results:  Recent Labs  08/29/14 0520 08/29/14 1600 08/30/14 0440  WBC 18.4*  --  25.8*  HGB 10.1* 9.7* 9.5*  HCT 29.6* 28.4* 27.9*  PLT 123*  --  173   BMET:  Recent Labs  08/29/14 0520 08/30/14 0440  NA 137 139  K 4.9 4.4  CL 101 100  CO2 24 26  GLUCOSE 122* 124*  BUN 35* 19  CREATININE 6.69* 4.08*  CALCIUM 7.5* 8.0*   No results for input(s): PTH in the last 72 hours. Iron Studies: No results for input(s): IRON, TIBC,  TRANSFERRIN, FERRITIN in the last 72 hours. Studies/Results: Dg Chest Port 1 View  08/29/2014   CLINICAL DATA:  Central line placement.  EXAM: PORTABLE CHEST - 1 VIEW  COMPARISON:  07/03/2012  FINDINGS: Interval placement of a left central venous catheter. Tip projects to the left mediastinum over the aortic arch. This could be in the brachiocephalic vein, in a normal variant left superior vena cava, or an arterial branch. No pneumothorax. Calcification in the left apex is likely postinflammatory. This is new since previous study. Heart size is normal. Hemidiaphragms are not included within the field of view.  IMPRESSION: Nonspecific placement of left central venous catheter to the left of midline, suggesting placement either in the brachiocephalic vein, left superior vena cava, or arterial branch. No pneumothorax.   Electronically Signed   By: Lucienne Capers M.D.   On: 08/29/2014 02:17   Scheduled: . amLODipine  5 mg Oral Daily  . atorvastatin  40 mg Oral q1800  . calcitRIOL  2.75 mcg Oral Q T,Th,Sa-HD  . calcium acetate  2,001 mg Oral TID WC  . carvedilol  6.25 mg Oral BID WC  . cinacalcet  60 mg Oral Daily  . colchicine  0.3 mg Oral Once per day on Mon Thu  . darbepoetin (ARANESP) injection - DIALYSIS  60 mcg Intravenous Q Sat-HD  .  docusate sodium  100 mg Oral BID  . multivitamin  1 tablet Oral QHS  . pantoprazole  40 mg Oral Daily  . polyethylene glycol  17 g Oral Daily  . predniSONE  5 mg Oral Daily  . sodium chloride  3 mL Intravenous Q12H     LOS: 14 days   Shaylan Tutton C 08/30/2014,3:06 PM

## 2014-08-30 NOTE — Progress Notes (Signed)
NUTRITION FOLLOW UP  DOCUMENTATION CODES Per approved criteria  -Severe malnutrition in the context of chronic illness -Underweight   INTERVENTION: D/C Resource Breeze Magic cup TID with meals, each supplement provides 290 kcal and 9 grams of protein  RD to follow for nutrition care plan  NUTRITION DIAGNOSIS: Increased nutrient needs related to malnutrition, ESRD on HD as evidenced by estimated nutrition needs, ongoing  Goal: Pt to meet >/= 90% of their estimated nutrition needs, unmet  Monitor:  PO & supplemental intake, weight, labs, I/O's  ASSESSMENT: 53 y.o. Male with a history of PVD, CAD s/p PCI in 12/2013, CHF, Hepatitis C, SBO and ESRD; had angioplasty of the right popliteal artery per Dr. Trula Slade on 11/11, but returned to the ED 11/22 with worsening right foot pain and was found to have significant plaque with reduced flows throughout the RLE; pt unable to stand to attend dialysis secondary to severe right foot pain and was admitted for management.   Patient s/p procedure 11/27: RIGHT FEMORAL TO BELOW KNEE POPLITEAL ARTERY BYPASS GRAFT  Pt transferred from 2W-Cardiac to 2S-SICU 12/5 due to significant GI bleed.  Pt continues to receive HD.   + abdominal pain and bloating.  Passing bloody stools.  Currently on solids (Renal with 1200 ml fluid restriction diet).  PO intake poor at 15% per flowsheet records.  Has Resource Breeze supplements ordered, however, he does not like them.  Amenable to trying Magic Cup dessert supplement.  RD to order on meal trays.  Height: Ht Readings from Last 1 Encounters:  08/18/14 5\' 11"  (1.803 m)    Weight: Wt Readings from Last 1 Encounters:  08/30/14 109 lb 12.6 oz (49.8 kg)    BMI:  Body mass index is 15.32 kg/(m^2).  Estimated Nutritional Needs: Kcal: 1600-1800 Protein: 75-85 gm Fluid: 1200 ml  Skin: R leg surgical incisions   Diet Order: Renal with 1200 ml fluid restriction    Intake/Output Summary (Last 24 hours) at  08/30/14 1256 Last data filed at 08/30/14 1200  Gross per 24 hour  Intake 642.51 ml  Output      0 ml  Net 642.51 ml    Labs:   Recent Labs Lab 08/24/14 1700 08/26/14 0840 08/29/14 0520 08/30/14 0440  NA 136* 138 137 139  K 5.0 4.1 4.9 4.4  CL 93* 96 101 100  CO2 19 26 24 26   BUN 54* 33* 35* 19  CREATININE 9.22*  9.22* 7.10* 6.69* 4.08*  CALCIUM 8.6 8.4 7.5* 8.0*  MG  --   --   --  1.8  PHOS 5.4* 4.5  --  3.1  GLUCOSE 63* 98 122* 124*    Scheduled Meds: . amLODipine  5 mg Oral Daily  . atorvastatin  40 mg Oral q1800  . calcitRIOL  2.75 mcg Oral Q T,Th,Sa-HD  . calcium acetate  2,001 mg Oral TID WC  . carvedilol  6.25 mg Oral BID WC  . cinacalcet  60 mg Oral Daily  . colchicine  0.3 mg Oral Once per day on Mon Thu  . darbepoetin (ARANESP) injection - DIALYSIS  60 mcg Intravenous Q Sat-HD  . docusate sodium  100 mg Oral BID  . feeding supplement (RESOURCE BREEZE)  1 Container Oral TID BM  . multivitamin  1 tablet Oral QHS  . pantoprazole  40 mg Oral Daily  . polyethylene glycol  17 g Oral Daily  . predniSONE  5 mg Oral Daily  . sodium chloride  3 mL Intravenous Q12H  Continuous Infusions: . sodium chloride Stopped (08/18/14 1430)    Past Medical History  Diagnosis Date  . ESRD on hemodialysis     a. ESRD 2/2 HTN with renal transplant in 1987 (cadaveric) after short period of dialysis;  b. Transplant failed in 2004 and he went back on HD;  c. As of 10/15 getting HD via L thigh AVG on a TTS schedule at [facility] on [facility].  . Hypertension   . Hx of kidney transplant     a. 1987-> back on HD since 2004  . Gout tophi   . Chronic steroid use     a. Has severe gout. Did not tolerate Allopurinol. Do not taper per PCP   . Systolic CHF, chronic     2 D echo 04/2012 with EF of 45 %   . Malnutrition   . Hx SBO 04/2012    a. 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 F/U with Dr Donne Hazel.  Started on TNA  . Anemia associated with chronic renal failure   . Hepatitis C   . GERD (gastroesophageal reflux disease)   . H/O hiatal hernia   . Arthritis   . History of DVT (deep vein thrombosis)   . Peripheral vascular disease     a. 12/2013 PTA of L Pop;  b. 12/2013 PTA R pop, R DP;  c. 06/2014 L Pop CBA/DCB PTA.  . Prostate cancer   . CAD (coronary artery disease)     a. 12/2013 Cath/PCI: EF 45-50%, LM Ca2+, LAD 30-40p, 66m, D1/2/3 min irregs, LCX 50ost, 30-48m, RCA 70p, 95/45m (Rota->3.0x23 Xience distal, 3.0x23 Xience mid, 3.0x28 Xience prox).  . Chronic diastolic CHF (congestive heart failure)     a. 12/2013 Echo: EF 55-60%, mild LVH, nl wall motion, Gr 2 DD.  . Dry gangrene     a. L great toe    Past Surgical History  Procedure Laterality Date  . Laparotomy  04/14/2012    Procedure: EXPLORATORY LAPAROTOMY;  Surgeon: Joyice Faster. Cornett, MD;  Location: [place];  Service: General;  Laterality: N/A;  . Colon resection  04/14/2012  . Laparotomy  04/22/2012    Procedure: EXPLORATORY LAPAROTOMY;  Surgeon: Adin Hector, MD;  Location: [place];  Service: General;  Laterality: N/A;  lysis of adhesions, omentoplasty, repair small bowel  . Thrombectomy w/ embolectomy  04/27/2012    Procedure: THROMBECTOMY ARTERIOVENOUS GORE-TEX GRAFT;  Surgeon: Angelia Mould, MD;  Location: [place];  Service: Vascular;  Laterality: Left;  Thrombectomy of left thigh arteriovenous gortex graft  . Prostectomy  2011  . Renal grafts    . Pelvic abcess drainage Right 6/14    removal drain s/p bowl resection 13  . Transanal excision of rectal mass N/A 03/30/2013    Procedure: EXCISION OF anal MASS;  Surgeon: Joyice Faster. Cornett, MD;  Location: [name];  Service: General;  Laterality: N/A;  Exam under anesthesia with excision anal verge mass  . Aortogram  08/15/2014    abd aortogram  . Femoral-popliteal bypass graft Right 08/18/2014    Procedure: BYPASS GRAFT FEMORAL-POPLITEAL ARTERY with Gortex Graft;  Surgeon: Serafina Mitchell, MD;  Location: [place];  Service: Vascular;  Laterality: Right;    Arthur Holms, RD, LDN Pager #: [phone] After-Hours Pager #: [phone]

## 2014-08-30 NOTE — Plan of Care (Signed)
Problem: Consults Goal: Skin Care Protocol Initiated - if Braden Score 18 or less If consults are not indicated, leave blank or document N/A  Outcome: Completed/Met Date Met:  08/30/14  Problem: Phase II Progression Outcomes Goal: No active bleeding Outcome: Completed/Met Date Met:  08/30/14 Goal: H&H stablized < 1gm drop in 24 hrs Outcome: Completed/Met Date Met:  08/30/14 Goal: Progress activity as tolerated unless otherwise ordered Outcome: Completed/Met Date Met:  08/30/14 Goal: Tolerating diet Outcome: Completed/Met Date Met:  08/30/14

## 2014-08-30 NOTE — Progress Notes (Signed)
Physical Therapy Treatment Patient Details Name: [name] MRN: [id number] DOB: Jun 21, 1961 Today's Date: 08/30/2014    History of Present Illness MAC DOWDELL is a 53 y.o. AA male with a history of peripheral vascular disease, CAD s/p PCI in 12/2013,  CHF, Hepatitis C, SBO and ESRD. He had angioplasty of the right popliteal artery per Dr. Trula Slade on 11/11, but returned to the ED 11/22 with worsening right foot pain and was found to have significant plaque with reduced flows throughout the RLE. 11/25 Pt was unable to stand to attend dialysis secondary to severe right foot pain and was admitted for management.  S/p R fem-pop BPG 11/27    PT Comments    Patient making good progress towards physical therapy goals which have been updated accordingly. Ambulates up to 65 feet today with min guard for patient safety, while using a rolling walker. Pt states he is staying with family here in [place] after he is d/c from the hospital. Anticipate he will progress well towards his functional independence as he improves medically and therefore updated discharge recommendation to home with HHPT. Will require short term SNF if family is unable to provide support or patient has any decline in function.  Follow Up Recommendations  Home health PT     Equipment Recommendations  3in1 (PT);Rolling walker with 5" wheels    Recommendations for Other Services       Precautions / Restrictions Precautions Precautions: Fall Restrictions Weight Bearing Restrictions: No    Mobility  Bed Mobility Overal bed mobility: Needs Assistance Bed Mobility: Supine to Sit     Supine to sit: Supervision;HOB elevated     General bed mobility comments: Supervision for safety. VC for technique. Did not require physical assist.  Transfers Overall transfer level: Needs assistance Equipment used: Rolling walker (2 wheeled) Transfers: Sit to/from Stand Sit to Stand: Min guard         General transfer comment:  Min guard for safety. Mildy unstable but able to self correct with hands on rolling walker for support.  Ambulation/Gait Ambulation/Gait assistance: Min guard Ambulation Distance (Feet): 65 Feet Assistive device: Rolling walker (2 wheeled) Gait Pattern/deviations: Step-through pattern;Decreased step length - left;Decreased stance time - right;Decreased stride length;Antalgic;Step-to pattern   Gait velocity interpretation: Below normal speed for age/gender General Gait Details: Very slow and guarded. VC for step-through gait pattern which is slowly emerging today. Intermittent cues for forward gaze. Darco shoe prevents movement of toes during push off and pt is able to bear majority of weigh through RLE. No loss of balance noted during bout.   Stairs            Wheelchair Mobility    Modified Rankin (Stroke Patients Only)       Balance                                    Cognition Arousal/Alertness: Awake/alert Behavior During Therapy: WFL for tasks assessed/performed Overall Cognitive Status: Within Functional Limits for tasks assessed                      Exercises      General Comments        Pertinent Vitals/Pain Pain Assessment: 0-10 Pain Score: 5  Pain Location: Rt toes Pain Descriptors / Indicators: Constant;Throbbing Pain Intervention(s): Limited activity within patient's tolerance;Monitored during session;Premedicated before session;Repositioned  Rest:  -HR 93 -SpO2 98%  on room air - BP 150/81  Ambulating: -HR to 119  After therapy -HR 102 -SpO2 unreadable 1/4 dyspnea - BP 170/97    Home Living                      Prior Function            PT Goals (current goals can now be found in the care plan section) Acute Rehab PT Goals PT Goal Formulation: With patient Time For Goal Achievement: 09/13/14 Potential to Achieve Goals: Good Progress towards PT goals: Progressing toward goals    Frequency  Min  3X/week    PT Plan Discharge plan needs to be updated    Co-evaluation             End of Session Equipment Utilized During Treatment: Gait belt Activity Tolerance: Patient tolerated treatment well Patient left: in chair;with call bell/phone within reach     Time: 1037-1103 PT Time Calculation (min) (ACUTE ONLY): 26 min  Charges:  $Gait Training: 8-22 mins $Therapeutic Activity: 8-22 mins                    G Codes:      Ellouise Newer 10-Sep-2014, 11:57 AM  Elayne Snare, [place]

## 2014-08-30 NOTE — Progress Notes (Addendum)
   [facility]  Subjective  - He has passed gas, eating some soft foods tolerating this well.  No bloody   Objective 181/72 96 98.7 F (37.1 C) (Oral) 28 98%  Intake/Output Summary (Last 24 hours) at 08/30/14 0733 Last data filed at 08/30/14 0600  Gross per 24 hour  Intake 815.01 ml  Output      0 ml  Net 815.01 ml    Abdomin soft, min. Global tenderness Doppler DP bilateral Right foot first, second and third toes dry gangrene   Assessment/Planning: POD # s/p: Right femoral to below knee popliteal artery bypass graft with 6 mm external ring propatent PTFE 12 Days Post-Op  Left thigh shuntogram with venoplasty and stenting of left common femoral vain.  6 Days Post-Op    HGB stable 9.5 no bleeding since 08/28/2014 HD last night BP increased off pressors Tolerating soft diet WBC increased 25.8 not currently on antibiotics Will discuss plan of care with DR. Brabham likely transfer to 2W today.  Laurence Slate [facility] 08/30/2014 7:33 AM --  Laboratory Lab Results:  Recent Labs  08/29/14 0520 08/29/14 1600 08/30/14 0440  WBC 18.4*  --  25.8*  HGB 10.1* 9.7* 9.5*  HCT 29.6* 28.4* 27.9*  PLT 123*  --  173   BMET  Recent Labs  08/29/14 0520 08/30/14 0440  NA 137 139  K 4.9 4.4  CL 101 100  CO2 24 26  GLUCOSE 122* 124*  BUN 35* 19  CREATININE 6.69* 4.08*  CALCIUM 7.5* 8.0*    COAG Lab Results  Component Value Date   INR 1.19 08/29/2014   INR 1.23 08/27/2014   INR 1.68* 08/26/2014   No results found for: PTT    I agree with the above Ischemic changes to right toes 1-3, do not appear infected Small amount of drainage from left great toe Discussed with patient he may require toe amputation at a leter date.  I do not think this is the source of his elevated WBC Good doppler signals in right leg Consider transfer to hospitalists once he leaves the ICU, given his multiple other co-morbidities  Wells Brabham

## 2014-08-30 NOTE — Progress Notes (Deleted)
NUTRITION FOLLOW UP  DOCUMENTATION CODES Per approved criteria  -Severe malnutrition in the context of chronic illness -Underweight   INTERVENTION: Resource Breeze po TID, each supplement provides 250 kcal and 9 grams of protein RD to follow for nutrition care plan  NUTRITION DIAGNOSIS: Increased nutrient needs related to malnutrition, ESRD on HD as evidenced by estimated nutrition needs, ongoing  Goal: Pt to meet >/= 90% of their estimated nutrition needs, unmet  Monitor:  PO & supplemental intake, weight, labs, I/O's  ASSESSMENT: 53 y.o. Male with a history of PVD, CAD s/p PCI in 12/2013, CHF, Hepatitis C, SBO and ESRD; had angioplasty of the right popliteal artery per Dr. Trula Slade on 11/11, but returned to the ED 11/22 with worsening right foot pain and was found to have significant plaque with reduced flows throughout the RLE; pt unable to stand to attend dialysis secondary to severe right foot pain and was admitted for management.   Patient s/p procedure 11/27: RIGHT FEMORAL TO BELOW KNEE POPLITEAL ARTERY BYPASS GRAFT  Pt transferred from 2W-Cardiac to 2S-SICU 12/5 due to significant GI bleed.  Pt continues to receive HD.   + abdominal pain and bloating.  Passing bloody stools.  Currently on solids (Renal with 1200 ml fluid restriction diet).  PO intake poor at 15% per flowsheet records.  Would benefit from addition of oral nutrition supplements.  Amenable to trying [employer].  RD to order.    Height: Ht Readings from Last 1 Encounters:  08/18/14 5\' 11"  (1.803 m)    Weight: Wt Readings from Last 1 Encounters:  08/30/14 109 lb 12.6 oz (49.8 kg)    BMI:  Body mass index is 15.32 kg/(m^2).  Estimated Nutritional Needs: Kcal: 1600-1800 Protein: 75-85 gm Fluid: 1200 ml  Skin: R leg surgical incisions   Diet Order: Renal with 1200 ml fluid restriction    Intake/Output Summary (Last 24 hours) at 08/30/14 1244 Last data filed at 08/30/14 1200  Gross per 24  hour  Intake 642.51 ml  Output      0 ml  Net 642.51 ml    Labs:   Recent Labs Lab 08/24/14 1700 08/26/14 0840 08/29/14 0520 08/30/14 0440  NA 136* 138 137 139  K 5.0 4.1 4.9 4.4  CL 93* 96 101 100  CO2 19 26 24 26   BUN 54* 33* 35* 19  CREATININE 9.22*  9.22* 7.10* 6.69* 4.08*  CALCIUM 8.6 8.4 7.5* 8.0*  MG  --   --   --  1.8  PHOS 5.4* 4.5  --  3.1  GLUCOSE 63* 98 122* 124*    Scheduled Meds: . amLODipine  5 mg Oral Daily  . atorvastatin  40 mg Oral q1800  . calcitRIOL  2.75 mcg Oral Q T,Th,Sa-HD  . calcium acetate  2,001 mg Oral TID WC  . carvedilol  6.25 mg Oral BID WC  . cinacalcet  60 mg Oral Daily  . colchicine  0.3 mg Oral Once per day on Mon Thu  . darbepoetin (ARANESP) injection - DIALYSIS  60 mcg Intravenous Q Sat-HD  . docusate sodium  100 mg Oral BID  . feeding supplement (RESOURCE BREEZE)  1 Container Oral TID BM  . multivitamin  1 tablet Oral QHS  . pantoprazole  40 mg Oral Daily  . polyethylene glycol  17 g Oral Daily  . predniSONE  5 mg Oral Daily  . sodium chloride  3 mL Intravenous Q12H    Continuous Infusions: . sodium chloride Stopped (08/18/14 1430)  Past Medical History  Diagnosis Date  . ESRD on hemodialysis     a. ESRD 2/2 HTN with renal transplant in 1987 (cadaveric) after short period of dialysis;  b. Transplant failed in 2004 and he went back on HD;  c. As of 10/15 getting HD via L thigh AVG on a TTS schedule at [facility] on [facility].  . Hypertension   . Hx of kidney transplant     a. 1987-> back on HD since 2004  . Gout tophi   . Chronic steroid use     a. Has severe gout. Did not tolerate Allopurinol. Do not taper per PCP   . Systolic CHF, chronic     2 D echo 04/2012 with EF of 45 %   . Malnutrition   . Hx SBO 04/2012    a. 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 F/U with Dr Donne Hazel. Started on TNA  . Anemia associated with chronic renal failure    . Hepatitis C   . GERD (gastroesophageal reflux disease)   . H/O hiatal hernia   . Arthritis   . History of DVT (deep vein thrombosis)   . Peripheral vascular disease     a. 12/2013 PTA of L Pop;  b. 12/2013 PTA R pop, R DP;  c. 06/2014 L Pop CBA/DCB PTA.  . Prostate cancer   . CAD (coronary artery disease)     a. 12/2013 Cath/PCI: EF 45-50%, LM Ca2+, LAD 30-40p, 12m, D1/2/3 min irregs, LCX 50ost, 30-34m, RCA 70p, 95/62m (Rota->3.0x23 Xience distal, 3.0x23 Xience mid, 3.0x28 Xience prox).  . Chronic diastolic CHF (congestive heart failure)     a. 12/2013 Echo: EF 55-60%, mild LVH, nl wall motion, Gr 2 DD.  . Dry gangrene     a. L great toe    Past Surgical History  Procedure Laterality Date  . Laparotomy  04/14/2012    Procedure: EXPLORATORY LAPAROTOMY;  Surgeon: Joyice Faster. Cornett, MD;  Location: [place];  Service: General;  Laterality: N/A;  . Colon resection  04/14/2012  . Laparotomy  04/22/2012    Procedure: EXPLORATORY LAPAROTOMY;  Surgeon: Adin Hector, MD;  Location: [place];  Service: General;  Laterality: N/A;  lysis of adhesions, omentoplasty, repair small bowel  . Thrombectomy w/ embolectomy  04/27/2012    Procedure: THROMBECTOMY ARTERIOVENOUS GORE-TEX GRAFT;  Surgeon: Angelia Mould, MD;  Location: [place];  Service: Vascular;  Laterality: Left;  Thrombectomy of left thigh arteriovenous gortex graft  . Prostectomy  2011  . Renal grafts    . Pelvic abcess drainage Right 6/14    removal drain s/p bowl resection 13  . Transanal excision of rectal mass N/A 03/30/2013    Procedure: EXCISION OF anal MASS;  Surgeon: Joyice Faster. Cornett, MD;  Location: [place];  Service: General;  Laterality: N/A;  Exam under anesthesia with excision anal verge mass  . Aortogram  08/15/2014    abd aortogram  . Femoral-popliteal bypass graft Right 08/18/2014    Procedure: BYPASS GRAFT FEMORAL-POPLITEAL ARTERY with Gortex Graft;  Surgeon: Serafina Mitchell, MD;  Location: [place];  Service: Vascular;   Laterality: Right;    Arthur Holms, RD, LDN Pager #: [phone] After-Hours Pager #: [phone]

## 2014-08-31 ENCOUNTER — Encounter (HOSPITAL_COMMUNITY): Payer: Self-pay | Admitting: Surgery

## 2014-08-31 LAB — CBC
HEMATOCRIT: 22.8 % — AB (ref 39.0–52.0)
Hemoglobin: 7.7 g/dL — ABNORMAL LOW (ref 13.0–17.0)
MCH: 29.3 pg (ref 26.0–34.0)
MCHC: 33.8 g/dL (ref 30.0–36.0)
MCV: 86.7 fL (ref 78.0–100.0)
PLATELETS: 104 10*3/uL — AB (ref 150–400)
RBC: 2.63 MIL/uL — AB (ref 4.22–5.81)
RDW: 17.1 % — ABNORMAL HIGH (ref 11.5–15.5)
WBC: 14.9 10*3/uL — AB (ref 4.0–10.5)

## 2014-08-31 LAB — RENAL FUNCTION PANEL
ALBUMIN: 1.8 g/dL — AB (ref 3.5–5.2)
ANION GAP: 12 (ref 5–15)
BUN: 34 mg/dL — AB (ref 6–23)
CHLORIDE: 99 meq/L (ref 96–112)
CO2: 25 mEq/L (ref 19–32)
Calcium: 8 mg/dL — ABNORMAL LOW (ref 8.4–10.5)
Creatinine, Ser: 5.86 mg/dL — ABNORMAL HIGH (ref 0.50–1.35)
GFR calc Af Amer: 11 mL/min — ABNORMAL LOW (ref >90)
GFR calc non Af Amer: 10 mL/min — ABNORMAL LOW (ref >90)
Glucose, Bld: 85 mg/dL (ref 70–99)
POTASSIUM: 4.8 meq/L (ref 3.7–5.3)
Phosphorus: 3.9 mg/dL (ref 2.3–4.6)
Sodium: 136 mEq/L — ABNORMAL LOW (ref 137–147)

## 2014-08-31 LAB — PREPARE RBC (CROSSMATCH)

## 2014-08-31 MED ORDER — MORPHINE SULFATE 2 MG/ML IJ SOLN
INTRAMUSCULAR | Status: AC
  Administered 2014-08-31: 2 mg via INTRAVENOUS
  Filled 2014-08-31: qty 1

## 2014-08-31 MED ORDER — LORAZEPAM 2 MG/ML IJ SOLN
0.5000 mg | Freq: Once | INTRAMUSCULAR | Status: AC
  Administered 2014-08-31: 0.5 mg via INTRAVENOUS
  Filled 2014-08-31: qty 1

## 2014-08-31 MED ORDER — SODIUM CHLORIDE 0.9 % IJ SOLN
10.0000 mL | INTRAMUSCULAR | Status: DC | PRN
  Administered 2014-09-03: 30 mL
  Administered 2014-09-04: 10 mL
  Filled 2014-08-31 (×2): qty 40

## 2014-08-31 MED ORDER — NEPRO/CARBSTEADY PO LIQD
237.0000 mL | ORAL | Status: DC | PRN
  Filled 2014-08-31: qty 237

## 2014-08-31 MED ORDER — PENTAFLUOROPROP-TETRAFLUOROETH EX AERO
1.0000 "application " | INHALATION_SPRAY | CUTANEOUS | Status: DC | PRN
  Administered 2014-09-04: 1 via TOPICAL

## 2014-08-31 MED ORDER — ALTEPLASE 2 MG IJ SOLR
2.0000 mg | Freq: Once | INTRAMUSCULAR | Status: AC | PRN
  Filled 2014-08-31: qty 2

## 2014-08-31 MED ORDER — SODIUM CHLORIDE 0.9 % IV SOLN: 100.0000 mL | INTRAVENOUS | Status: DC | PRN

## 2014-08-31 MED ORDER — LIDOCAINE-PRILOCAINE 2.5-2.5 % EX CREA
1.0000 | TOPICAL_CREAM | CUTANEOUS | Status: DC | PRN
Start: 2014-08-31 — End: 2014-09-05
  Filled 2014-08-31: qty 5

## 2014-08-31 MED ORDER — SODIUM CHLORIDE 0.9 % IV SOLN: Freq: Once | INTRAVENOUS | Status: DC

## 2014-08-31 MED ORDER — LIDOCAINE HCL (PF) 1 % IJ SOLN: 5.0000 mL | INTRAMUSCULAR | Status: DC | PRN

## 2014-08-31 MED ORDER — SODIUM CHLORIDE 0.9 % IJ SOLN
10.0000 mL | Freq: Two times a day (BID) | INTRAMUSCULAR | Status: DC
  Administered 2014-08-31 – 2014-09-01 (×2): 20 mL
  Administered 2014-09-01: 10 mL
  Administered 2014-09-02: 30 mL

## 2014-08-31 MED ORDER — HEPARIN SODIUM (PORCINE) 1000 UNIT/ML DIALYSIS: 1000.0000 [IU] | INTRAMUSCULAR | Status: DC | PRN

## 2014-08-31 NOTE — Progress Notes (Signed)
RN calling patient anxious  PLAN  Ativan 0.5mg  x 1 IV  Dr. Brand Males, M.D., [facility]C.P Pulmonary and Critical Care Medicine Staff Physician [place] [facility] Pager: [phone], If no answer or between  15:00h - 7:00h: call [phone]  08/31/2014 3:41 AM

## 2014-08-31 NOTE — Plan of Care (Signed)
Problem: Phase II Progression Outcomes Goal: Progress activity as tolerated unless otherwise ordered Outcome: Progressing Pt stood at bedside for weights and ambulates with PT.

## 2014-08-31 NOTE — Progress Notes (Signed)
Assessment: 1. Acute GI bleed - recurrent Per GI. Possible colonscopy 2. Ischemic R foot, s/p R fem-pop 11/27 3. Ischemic L foot s/p L popliteal angioplasty x 2 per [facility] (April then October 2015) 4. Left leg edema - resolved, s/p venoplasty / stenting of left CF vein 09/03/14 5. ESRD on HD - no hep HD for now given GIB,TTS-HD today.  Will give PRBCs 6. HTN/volume - BP improved.   7. Hx CAD s/p PCI/ stents - on NTG patch 8. Anemia w/ABLA    Subjective: Interval History: Feels better, anxious last PM  Objective: Vital signs in last 24 hours: Temp:  [97.6 F (36.4 C)-98.6 F (37 C)] 97.9 F (36.6 C) (12/10 0718) Pulse Rate:  [62-114] 65 (12/10 0800) Resp:  [13-30] 18 (12/10 0800) BP: (139-225)/(27-131) 157/74 mmHg (12/10 0800) SpO2:  [82 %-100 %] 99 % (12/10 0800) Weight:  [51.6 kg (113 lb 12.1 oz)] 51.6 kg (113 lb 12.1 oz) (12/10 0600) Weight change: 1.8 kg (3 lb 15.5 oz)  Intake/Output from previous day: 12/09 0701 - 12/10 0700 In: 960 [P.O.:720; I.V.:240] Out: -  Intake/Output this shift: Total I/O In: 10 [I.V.:10] Out: -   General appearance: alert and cooperative GI: soft, non-tender; bowel sounds normal; no masses,  no organomegaly Extremities: no edema  Lab Results:  Recent Labs  08/30/14 0440 08/31/14 0500  WBC 25.8* 14.9*  HGB 9.5* 7.7*  HCT 27.9* 22.8*  PLT 173 104*   BMET:  Recent Labs  08/30/14 0440 08/31/14 0500  NA 139 136*  K 4.4 4.8  CL 100 99  CO2 26 25  GLUCOSE 124* 85  BUN 19 34*  CREATININE 4.08* 5.86*  CALCIUM 8.0* 8.0*   No results for input(s): PTH in the last 72 hours. Iron Studies: No results for input(s): IRON, TIBC, TRANSFERRIN, FERRITIN in the last 72 hours. Studies/Results: No results found.  Scheduled: . amLODipine  5 mg Oral Daily  . atorvastatin  40 mg Oral q1800  . calcitRIOL  2.75 mcg Oral Q T,Th,Sa-HD  . calcium acetate  2,001 mg Oral TID WC  . carvedilol  6.25 mg Oral BID WC  . cinacalcet  60 mg Oral Daily   . colchicine  0.3 mg Oral Once per day on Mon Thu  . darbepoetin (ARANESP) injection - DIALYSIS  60 mcg Intravenous Q Sat-HD  . docusate sodium  100 mg Oral BID  . multivitamin  1 tablet Oral QHS  . pantoprazole  40 mg Oral Daily  . polyethylene glycol  17 g Oral Daily  . predniSONE  5 mg Oral Daily  . sodium chloride  3 mL Intravenous Q12H     LOS: 15 days   [name] C 08/31/2014,8:43 AM

## 2014-08-31 NOTE — Progress Notes (Signed)
Daily Rounding Note  08/31/2014, 8:53 AM  LOS: 15 days   SUBJECTIVE:       Renal diet. Just a smear of loose, black stool and some flatus yesterday.  No BPR.  "Tight" abdomen is improved. Not eating much, no nausea.   OBJECTIVE:         Vital signs in last 24 hours:    Temp:  [97.6 F (36.4 C)-98.6 F (37 C)] 97.9 F (36.6 C) (12/10 0718) Pulse Rate:  [62-114] 65 (12/10 0800) Resp:  [13-30] 18 (12/10 0800) BP: (139-225)/(27-131) 157/74 mmHg (12/10 0800) SpO2:  [82 %-100 %] 99 % (12/10 0800) Weight:  [113 lb 12.1 oz (51.6 kg)] 113 lb 12.1 oz (51.6 kg) (12/10 0600) Last BM Date: 08/28/14 Filed Weights   08/29/14 0645 08/30/14 0057 08/31/14 0600  Weight: 108 lb 11 oz (49.3 kg) 109 lb 12.6 oz (49.8 kg) 113 lb 12.1 oz (51.6 kg)   General: looks less well, may be due to his laying flat in bed covered in blanket as opposed to sitting up in chair.  Comfortable. Cachectic.  Again smells of necrosis. Heart: RRR Chest: clear bil. Reduced BS Abdomen: soft, ND, NT, BS hypoactive  Extremities: black toes on right.  Neuro/Psych:  Appropriate, alert, not agitated.  More somnolent.   Intake/Output from previous day: 12/09 0701 - 12/10 0700 In: 960 [P.O.:720; I.V.:240] Out: -   Intake/Output this shift: Total I/O In: 10 [I.V.:10] Out: -   Lab Results:  Recent Labs  08/29/14 0520 08/29/14 1600 08/30/14 0440 08/31/14 0500  WBC 18.4*  --  25.8* 14.9*  HGB 10.1* 9.7* 9.5* 7.7*  HCT 29.6* 28.4* 27.9* 22.8*  PLT 123*  --  173 104*   BMET  Recent Labs  08/29/14 0520 08/30/14 0440 08/31/14 0500  NA 137 139 136*  K 4.9 4.4 4.8  CL 101 100 99  CO2 24 26 25   GLUCOSE 122* 124* 85  BUN 35* 19 34*  CREATININE 6.69* 4.08* 5.86*  CALCIUM 7.5* 8.0* 8.0*   LFT  Recent Labs  08/29/14 0520 08/31/14 0500  PROT 4.6*  --   ALBUMIN 1.7* 1.8*  AST 34  --   ALT 14  --   ALKPHOS 108  --   BILITOT 1.2  --     PT/INR  Recent Labs  08/29/14 0520  LABPROT 15.2  INR 1.19     ASSESMENT:   * Hematochezia, no acute abdominal pain. CT scan most c/w ischemic colitis.this is likely source of the leukocytosis. ? Diverticular bleed? Constipated leading up to hematochezia.  Recurrent large volume bleeding, hypotension requiring Neo, since d/c'd.  02/2010 screening colonoscopy: diverticulosis.  WBCs improving.   * Chronic Plavix. On hold. Coags normal.   * ABL on top of chronic anemia. On aranesp. S/p 9 PRBCs, last 2 on 12/8. Hgb dropped last 24 hours. 2 more PRBCs ordered.  * Coagulopathy. S/p Vitamin K and FFP x 4 units. coags now normal. Not on Coumadin pta. .   *  Thrombocytopenia.   * ESRD. On HD TTS.   * Hep C +. No cirrhosis on CT 08/27/14.  * S/p 2 previous SB resections. S/p segmental colectomy for perforated diverticulits, colostomy ultimately reversed.   * CAD. 3 drug eluting stents placed 8 months ago. Per cardiology: at minimum needs ASA/Plavix x 12 months butin his case this needs to be life long.   * ASPVD. Extensive vascular disease noted on  CT. Ischemic R foot, s/p R fem-pop 11/27. Ischemic L foot s/p L popliteal angioplasty x 2 per [facility] (April then October 2015). "may need surgical intervention in the future" Per Vascular surgery note today.  Plavix discontinued 12/5 (date of his last dose). On Plavix and low dose ASA PTA.   * Protein malnutrition.     PLAN   *  ? When to resume Plavix, low dose ASA?    *  Transfuse 2 PRBCs as ordered. HD today.    Azucena Freed  08/31/2014, 8:53 AM Pager: [phone]  GI ATTENDING  Interval history data reviewed. Patient seen and examined. Agree with progress note as outlined above. No significant bleeding. Still anemic. Abdominal complaints less and abdominal exam benign. From GI standpoint, continue supportive care. Transfuse as needed.  Docia Chuck. Geri Seminole., M.D. [facility] Division  of Gastroenterology

## 2014-08-31 NOTE — Plan of Care (Signed)
Problem: Phase II Progression Outcomes Goal: If Diabetic, blood sugar < 150 Outcome: Completed/Met Date Met:  08/31/14

## 2014-08-31 NOTE — Progress Notes (Signed)
Report called to nurse on 2 heart and HD nurse notified that patient should be sent to 2 heart room 19 after treatment. Belongings sent to 2 heart. Sandre Kitty

## 2014-08-31 NOTE — Progress Notes (Signed)
[facility] PROGRESS NOTE  [name] SWF:[id number] DOB: December 23, 1960 DOA: 08/16/2014 PCP: Placido Sou, MD  Assessment/Plan: [name] is a 53 y.o. 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 He is s/p Rt fem-pop bypass 11/27 and Left popliteal angioplasty x 2 (April and October 15) and venoplasty and stenting of left common femoral vein 08/24/14. He was stable on the vascular service, however on 12/5 he began having abdominal pain with bright red blood per rectum on aspirin and plavix. He was transferred to ICU at that time for lower GI bleed. He was seen by [facility] who believes his symptoms and CT scan are most consistent with Ischemic colitis. He has received a total of 9 units of PRBCs and 4 units of FFP. Patient develops recurrent of GI bleed on 12-07. CCM was consulted again. He received pressors at that time. Patient transfer to [facility] service again 12-10/ patient off pressors.   1-Hemorrhagic Shock, GI bleed: presume to ischemic colitis; Has received 9 units of PRBC.  Hb drop to 7.7. Transfuse 2 units PRBC during dialysis today.  GI following.  On oral protonix.  Continue to hold plavix, aspirin.   2-Severe Protein caloric malnutrition; On Nepro.   ESRd on dialysis; renal following.  Dialysis today.   History of CAD, S/P DES in April 2015; On amlodipine and coreg.  Continue with lipitor.  Holding plavix and aspirin due to severe , recurrent GI bleed.   PVD S/P :  He is s/p Rt fem-pop bypass 11/27 and Left popliteal angioplasty x 2 (April and October 15)  History of gout on Chronic prednisone;   Code Status: Full Code.  Family Communication: Care discussed with patient.  Disposition Plan:    Consultants:  GI, [place].   Procedures: 12/02 LT leg doppler >> no DVT 12/06 CT abd >> soft tissue inflammation at distal  sigmoid/rectum  Antibiotics:  none  HPI/Subjective: No complaint. Denies chest pain, or dyspnea.  He had Small, blak ball stool yesterday.    Objective: Filed Vitals:   08/31/14 0800  BP: 157/74  Pulse: 65  Temp:   Resp: 18    Intake/Output Summary (Last 24 hours) at 08/31/14 0830 Last data filed at 08/31/14 0800  Gross per 24 hour  Intake    960 ml  Output      0 ml  Net    960 ml   Filed Weights   08/29/14 0645 08/30/14 0057 08/31/14 0600  Weight: 49.3 kg (108 lb 11 oz) 49.8 kg (109 lb 12.6 oz) 51.6 kg (113 lb 12.1 oz)    Exam:   General: Alert in no distress.   Cardiovascular: S 1, S 2 RRR  Respiratory: Bilateral Crackles.   Abdomen: Bs present, soft, mild tenderness. No rigidity.   Musculoskeletal: trace edema. Left foot with ischemic, dry gangrene 1, 2 , 3 toe.   Data Reviewed: Basic Metabolic Panel:  Recent Labs Lab 08/24/14 1700 08/26/14 0840 08/29/14 0520 08/30/14 0440 08/31/14 0500  NA 136* 138 137 139 136*  K 5.0 4.1 4.9 4.4 4.8  CL 93* 96 101 100 99  CO2 19 26 24 26 25   GLUCOSE 63* 98 122* 124* 85  BUN 54* 33* 35* 19 34*  CREATININE 9.22*  9.22* 7.10* 6.69* 4.08* 5.86*  CALCIUM 8.6 8.4 7.5* 8.0* 8.0*  MG  --   --   --  1.8  --  PHOS 5.4* 4.5  --  3.1 3.9   Liver Function Tests:  Recent Labs Lab 08/24/14 1700 08/26/14 0840 08/29/14 0520 08/31/14 0500  AST  --   --  34  --   ALT  --   --  14  --   ALKPHOS  --   --  108  --   BILITOT  --   --  1.2  --   PROT  --   --  4.6*  --   ALBUMIN 1.9* 1.8* 1.7* 1.8*   No results for input(s): LIPASE, AMYLASE in the last 168 hours. No results for input(s): AMMONIA in the last 168 hours. CBC:  Recent Labs Lab 08/28/14 1849 08/28/14 2133 08/29/14 0520 08/29/14 1600 08/30/14 0440 08/31/14 0500  WBC 16.7* 20.6* 18.4*  --  25.8* 14.9*  HGB 9.7* 8.3* 10.1* 9.7* 9.5* 7.7*  HCT 29.2* 24.7* 29.6* 28.4* 27.9* 22.8*  MCV 81.8 84.0 85.3  --  84.5 86.7  PLT 221 253 123*  --  173 104*    Cardiac Enzymes:  Recent Labs Lab 08/29/14 0520  TROPONINI <0.30   BNP (last 3 results) No results for input(s): PROBNP in the last 8760 hours. CBG: No results for input(s): GLUCAP in the last 168 hours.  Recent Results (from the past 240 hour(s))  Surgical pcr screen     Status: None   Collection Time: 08/26/14  5:20 PM  Result Value Ref Range Status   MRSA, PCR NEGATIVE NEGATIVE Final   Staphylococcus aureus NEGATIVE NEGATIVE Final    Comment:        The Xpert SA Assay (FDA approved for NASAL specimens in patients over 70 years of age), is one component of a comprehensive surveillance program.  Test performance has been validated by [employer] for patients greater than or equal to 54 year old. It is not intended to diagnose infection nor to guide or monitor treatment.      Studies: No results found.  Scheduled Meds: . amLODipine  5 mg Oral Daily  . atorvastatin  40 mg Oral q1800  . calcitRIOL  2.75 mcg Oral Q T,Th,Sa-HD  . calcium acetate  2,001 mg Oral TID WC  . carvedilol  6.25 mg Oral BID WC  . cinacalcet  60 mg Oral Daily  . colchicine  0.3 mg Oral Once per day on Mon Thu  . darbepoetin (ARANESP) injection - DIALYSIS  60 mcg Intravenous Q Sat-HD  . docusate sodium  100 mg Oral BID  . multivitamin  1 tablet Oral QHS  . pantoprazole  40 mg Oral Daily  . polyethylene glycol  17 g Oral Daily  . predniSONE  5 mg Oral Daily  . sodium chloride  3 mL Intravenous Q12H   Continuous Infusions: . sodium chloride 10 mL/hr at 08/31/14 0600    Active Problems:   Peripheral vascular disease   CAD (coronary artery disease), native coronary artery   Essential hypertension   Acute GI hemorrhage   Abdominal pain, left lower quadrant   ESRD on dialysis   Hemorrhagic shock   Acute GI bleeding   Ischemic colitis    Time spent: 35 minutes.     Niel Hummer A  [facility] Pager [phone]. If 7PM-7AM, please contact night-coverage at  [email or web address], password [email or web address] 08/31/2014, 8:30 AM  LOS: 15 days

## 2014-08-31 NOTE — Progress Notes (Signed)
   [facility]  Subjective  - No new complaints today.  He had anxiety last and was given Ativan which helped.   Objective 145/64 62 97.9 F (36.6 C) (Oral) 14 97%  Intake/Output Summary (Last 24 hours) at 08/31/14 0736 Last data filed at 08/31/14 0700  Gross per 24 hour  Intake    960 ml  Output      0 ml  Net    960 ml    Doppler signals DP bilateral Right foot 1-3 toes dry gangrene no change today Abdomin soft Heart RRR  Assessment/Planning: POD # s/p: Right femoral to below knee popliteal artery bypass graft with 6 mm external ring propatent PTFE 13 Days Post-Op  Left thigh shuntogram with venoplasty and stenting of left common femoral vain.  7 Days Post-Op  HGB down 7.7 from 9.5 WBC 25.8 down 14.9  He may benefit from PRN Ativan  Laurence Slate [facility] 08/31/2014 7:36 AM --  Laboratory Lab Results:  Recent Labs  08/30/14 0440 08/31/14 0500  WBC 25.8* 14.9*  HGB 9.5* 7.7*  HCT 27.9* 22.8*  PLT 173 104*   BMET  Recent Labs  08/30/14 0440 08/31/14 0500  NA 139 136*  K 4.4 4.8  CL 100 99  CO2 26 25  GLUCOSE 124* 85  BUN 19 34*  CREATININE 4.08* 5.86*  CALCIUM 8.0* 8.0*    COAG Lab Results  Component Value Date   INR 1.19 08/29/2014   INR 1.23 08/27/2014   INR 1.68* 08/26/2014   No results found for: PTT

## 2014-09-01 LAB — CBC WITH DIFFERENTIAL/PLATELET
Basophils Absolute: 0 10*3/uL (ref 0.0–0.1)
Basophils Relative: 0 % (ref 0–1)
Eosinophils Absolute: 0.1 10*3/uL (ref 0.0–0.7)
Eosinophils Relative: 1 % (ref 0–5)
HEMATOCRIT: 34.2 % — AB (ref 39.0–52.0)
HEMOGLOBIN: 11.3 g/dL — AB (ref 13.0–17.0)
LYMPHS PCT: 11 % — AB (ref 12–46)
Lymphs Abs: 1.1 10*3/uL (ref 0.7–4.0)
MCH: 29.1 pg (ref 26.0–34.0)
MCHC: 33 g/dL (ref 30.0–36.0)
MCV: 88.1 fL (ref 78.0–100.0)
MONOS PCT: 10 % (ref 3–12)
Monocytes Absolute: 1 10*3/uL (ref 0.1–1.0)
NEUTROS ABS: 8.5 10*3/uL — AB (ref 1.7–7.7)
Neutrophils Relative %: 79 % — ABNORMAL HIGH (ref 43–77)
Platelets: 93 10*3/uL — ABNORMAL LOW (ref 150–400)
RBC: 3.88 MIL/uL — AB (ref 4.22–5.81)
RDW: 16.6 % — ABNORMAL HIGH (ref 11.5–15.5)
WBC: 10.8 10*3/uL — AB (ref 4.0–10.5)

## 2014-09-01 LAB — GLUCOSE, CAPILLARY
GLUCOSE-CAPILLARY: 72 mg/dL (ref 70–99)
GLUCOSE-CAPILLARY: 64 mg/dL — AB (ref 70–99)
GLUCOSE-CAPILLARY: 80 mg/dL (ref 70–99)
Glucose-Capillary: 64 mg/dL — ABNORMAL LOW (ref 70–99)
Glucose-Capillary: 65 mg/dL — ABNORMAL LOW (ref 70–99)
Glucose-Capillary: 65 mg/dL — ABNORMAL LOW (ref 70–99)
Glucose-Capillary: 67 mg/dL — ABNORMAL LOW (ref 70–99)
Glucose-Capillary: 69 mg/dL — ABNORMAL LOW (ref 70–99)
Glucose-Capillary: 75 mg/dL (ref 70–99)
Glucose-Capillary: 82 mg/dL (ref 70–99)
Glucose-Capillary: 82 mg/dL (ref 70–99)
Glucose-Capillary: 88 mg/dL (ref 70–99)

## 2014-09-01 LAB — CBC
HEMATOCRIT: 33.4 % — AB (ref 39.0–52.0)
Hemoglobin: 11 g/dL — ABNORMAL LOW (ref 13.0–17.0)
MCH: 28.9 pg (ref 26.0–34.0)
MCHC: 32.9 g/dL (ref 30.0–36.0)
MCV: 87.7 fL (ref 78.0–100.0)
PLATELETS: 93 10*3/uL — AB (ref 150–400)
RBC: 3.81 MIL/uL — ABNORMAL LOW (ref 4.22–5.81)
RDW: 16.3 % — AB (ref 11.5–15.5)
WBC: 10.1 10*3/uL (ref 4.0–10.5)

## 2014-09-01 LAB — BASIC METABOLIC PANEL
ANION GAP: 11 (ref 5–15)
BUN: 16 mg/dL (ref 6–23)
CO2: 28 mEq/L (ref 19–32)
Calcium: 7.7 mg/dL — ABNORMAL LOW (ref 8.4–10.5)
Chloride: 101 mEq/L (ref 96–112)
Creatinine, Ser: 3.1 mg/dL — ABNORMAL HIGH (ref 0.50–1.35)
GFR, EST AFRICAN AMERICAN: 24 mL/min — AB (ref >90)
GFR, EST NON AFRICAN AMERICAN: 21 mL/min — AB (ref >90)
Glucose, Bld: 63 mg/dL — ABNORMAL LOW (ref 70–99)
POTASSIUM: 3.6 meq/L — AB (ref 3.7–5.3)
Sodium: 140 mEq/L (ref 137–147)

## 2014-09-01 LAB — PLATELET INHIBITION P2Y12: Platelet Function  P2Y12: 267 [PRU] (ref 194–418)

## 2014-09-01 MED ORDER — METOCLOPRAMIDE HCL 5 MG/ML IJ SOLN
10.0000 mg | Freq: Three times a day (TID) | INTRAMUSCULAR | Status: DC
  Administered 2014-09-01 – 2014-09-04 (×8): 10 mg via INTRAVENOUS
  Filled 2014-09-01 (×12): qty 2

## 2014-09-01 MED ORDER — PEG-KCL-NACL-NASULF-NA ASC-C 100 G PO SOLR
1.0000 | Freq: Once | ORAL | Status: AC
  Administered 2014-09-01: 200 g via ORAL
  Filled 2014-09-01: qty 1

## 2014-09-01 NOTE — Progress Notes (Signed)
Daily Rounding Note  09/01/2014, 8:37 AM  LOS: 16 days   SUBJECTIVE:       Started having frankly bloody stools and "tight" feeling in abdomen last night.  Stools burgundy this AM.  Does not want to take pos as they increase the tightness.  No vomiting and not nauseated though fearful of becoming nauseated and so self limiting po intake.   OBJECTIVE:         Vital signs in last 24 hours:    Temp:  [97.4 F (36.3 C)-98.2 F (36.8 C)] 98.2 F (36.8 C) (12/11 0758) Pulse Rate:  [65-83] 83 (12/11 0400) Resp:  [15-23] 20 (12/11 0758) BP: (149-191)/(25-129) 176/25 mmHg (12/11 0758) SpO2:  [92 %-100 %] 98 % (12/11 0758) Weight:  [110 lb 10.7 oz (50.2 kg)-113 lb 1.5 oz (51.3 kg)] 111 lb 9.6 oz (50.621 kg) (12/11 0400) Last BM Date: 09/01/14 Filed Weights   08/31/14 1521 08/31/14 2000 09/01/14 0400  Weight: 110 lb 10.7 oz (50.2 kg) 111 lb 1.8 oz (50.4 kg) 111 lb 9.6 oz (50.621 kg)   General: looks ill.    Heart: RRR Chest: clear bil.  Reduced BS bil  Abdomen: more distended and tender.  Hypoactive BS.   Extremities: gangrene of right toes.  Neuro/Psych:  Cooperative, affect bland.  No gross deficits.   Intake/Output from previous day: 12/10 0701 - 12/11 0700 In: 1290 [P.O.:460; I.V.:160; Blood:670] Out: 1030   Intake/Output this shift:    Lab Results:  Recent Labs  08/30/14 0440 08/31/14 0500 09/01/14 0336  WBC 25.8* 14.9* 10.1  HGB 9.5* 7.7* 11.0*  HCT 27.9* 22.8* 33.4*  PLT 173 104* 93*   BMET  Recent Labs  08/30/14 0440 08/31/14 0500 09/01/14 0336  NA 139 136* 140  K 4.4 4.8 3.6*  CL 100 99 101  CO2 26 25 28   GLUCOSE 124* 85 63*  BUN 19 34* 16  CREATININE 4.08* 5.86* 3.10*  CALCIUM 8.0* 8.0* 7.7*   LFT  Recent Labs  08/31/14 0500  ALBUMIN 1.8*     ASSESMENT:   * Hematochezia, bloody stools.  No "pain" but having his equivalent which is feeling "tight":  Acutely worse in last several  hours. . CT scan most c/w ischemic colitis. ? Diverticular bleed? Constipated leading up to hematochezia.  Recurrent large volume bleeding with pressor requiring hypotension 12/7-12/8: resolved. Leukocytosis resolved.  02/2010 screening colonoscopy: diverticulosis.   * Chronic Plavix, hx CAD.  On hold.  S/p DES x 3 placement 12/2013. Per cardiology: at minimum needs ASA/Plavix x 12 months but In his case this needs to be life long.   * ABL on top of chronic anemia. On aranesp. S/p 11 PRBCs. Vigorous response to latest 2 transfusions of 12/10: hgb up 3 plus grams.   * Coagulopathy. S/p Vitamin K and FFP x 4 units. coags now normal. Not on Coumadin pta. .   * ESRD. On HD TTS.   * Hep C +. No cirrhosis on CT 08/27/14.  * S/p 2 previous SB resections. S/p segmental colectomy for perforated diverticulits, colostomy ultimately reversed.   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 Ischemic R foot, s/p R fem-pop 11/27. Ischemic L foot s/p L popliteal angioplasty x 2 per [facility] (April then October 2015). "may need surgical intervention in the future" Per Vascular surgery note today.  Plavix discontinued 12/5 (date of his last dose). On Plavix and low dose ASA PTA.   *  Protein malnutrition.     PLAN   *  Aiming for colonoscopy tomorrow, before dialysis.  Will let him start the prep today.  Added q 8 hour Reglan for 4 doses.     Azucena Freed  09/01/2014, 8:37 AM Pager: [phone]  GI ATTENDING  Patient personally seen and examined. Agree with H&P as above. The patient continues with intermittent bleeding. Plan is now for colonoscopic evaluation to confirm ischemia versus other cause for ongoing bleeding. The patient is high-risk given his comorbidities.The nature of the procedure, as well as the risks, benefits, and alternatives were carefully and thoroughly reviewed with the patient. Ample time for discussion and questions allowed. The patient understood, was  satisfied, and agreed to proceed.  Docia Chuck. Geri Seminole., M.D. [facility] Division of Gastroenterology

## 2014-09-01 NOTE — Progress Notes (Signed)
[name]  BUDD FREIERMUTH JSE:[id number] DOB: 05-Apr-1961 DOA: 08/16/2014 PCP: Placido Sou, MD  Brief narrative: 53 year old male patient with known vascular disease, chronic kidney disease on dialysis, hypertension, chronic steroids for gout, and protein calorie malnutrition. He has a chronic wound of the right lower extremity followed by Dr. Trula Slade with prior revascularizations. He presented to the ER with significant right foot pain on 11/22. An arterial duplex was performed while in the ER and had no significant changes so the patient was discharged home. The following day he continued to have pain in the leg especially with activity. He notified his vascular surgeon's office and was scheduled for arteriogram on 11/24 as an outpatient. Unfortunately he continued to have pain and was subsequently admitted to the hospital on 11/25 by the vascular surgical team.  After admission patient underwent right femoral to below the knee popliteal artery bypass graft on 11/27. Cardiology was consulted preoperatively for clearance. Nephrology was also consulted and patient continued on his usual dialysis treatments. In the postoperative period patient had relative hypotension but appropriate Doppler signals to the right lower extremity. On postop day 2 he was started on Lovenox.  He also was eventually restarted on his Plavix.  He stabilized and was transferred to a telemetry unit.  Patient also underwent left thigh shuntogram with subsequent venoplasty and stenting of the left common femoral vein on 12/3. Previous left foot swelling improved remarkably after this procedure. Patient continues with dry gangrene of the left great toe and plantar surface of the foot which is stable according to the vascular surgery team. Plans were for the patient to eventually discharged home with home health PT.  By 12/3 he began having multiple bowel movements. By 12/5 patient  developed hematochezia prompting a gastroenterology evaluation. It was suspected patient's symptoms were related to ischemic colitis.  He was transfered to the ICU. Patient was transfused and monitored closely. He did require pressors to treat hemorrhagic shock. He received a total of 9 units of packed red blood cells and 4 units of FFP. Aspirin and Plavix as well as other anticoagulation were held. Patient was not receiving heparin with dialysis. Frank bleeding resolved and he was able to be transferred to the stepdown unit. As of 12/10 patient was still passing dark stools.   On the morning of 12/11 bright red stool resumed and patient had worsening sensation of bloating without abdominal pain. Consideration is being given to proceeding with nuclear medicine GI bleeding scan since patient also has a history of diverticulosis versus pursuing flexible sigmoidoscopy since bleeding likely ischemic colitis in nature.  HPI/Subjective: Alert and primary complaining of bloating without abdominal tenderness as well as anorexia.  Assessment/Plan:  Hemorrhagic shock -shock has resolved   Lower GI bleed presumed to be related to ischemic colitis -GI following -GI plans to pursue colo on 12/12  -Apparently has a history of diverticulosis as well  Acute blood loss anemia -Has already received total of 11 units of packed red blood cells this admission -On 12/10 patient's hemoglobin had dropped to 7.7 so he was transfused 2 units of packed red blood cells  -Unfortunately bleeding has resumed so we'll check CBCs every 12 hours obtaining a stat reading today at 11 AM noting this would be several hours after bleeding has resumed  Hypoglycemia -Not currently on any insulins or other OHAs -Likely from poor intake -Since actively GI bleeding have decreased diet to no more than clear  liquids -At this point continue when necessary IV dextrose based on frequent CBG checks -Increased CBG frequency to every 4  hours -Since dialysis patient will not have continuous IV dextrose infusion at this time  ESRD on dialysis -Per nephrology -Dialyzes on Tuesdays Thursdays and Saturdays  HTN -cont usual meds but watch for downward trend in setting of recurrent GIB  Peripheral vascular disease -Has chronic dry gangrene of left foot -Status post right femoropopliteal bypass on 11/27  Severe protein calorie malnutrition -Further exacerbated by abdominal bloating and ongoing GI bleeding and resultant anorexia i.e. colitis related ileus -Continue Nepro as tolerated  History of CAD with prior drug-eluting stent April 2015 -Preoperative clearance was obtained with cardiology-no acute cardiac issue so they are no longer following -Continue amlodipine and carvedilol but watch for recurrent hypotension in setting of recurrent GI bleeding -Aspirin and Plavix on hold due to recurrent GI bleeding -Continue Lipitor  History of gout on chronic prednisone -Continue maintenance prednisone for now but if hypotension recurs may require stress dose steroids  DVT prophylaxis: SCDs Code Status: Full code Family Communication: No family at bedside Disposition Plan/Expected LOS: Stepdown  Consultants: Cardiology-signed off Nephrology PCCM Gastroenterology  Procedures: 11/27 right femoral to below-knee popliteal artery bypass graft  12/3 left thigh shuntogram of AV shunt with venoplasty and stent of left common femoral vein  Antibiotics: none  Objective: Blood pressure 176/25, pulse 83, temperature 98.2 F (36.8 C), temperature source Oral, resp. rate 20, height 5\' 11"  (1.803 m), weight 111 lb 9.6 oz (50.621 kg), SpO2 98 %.  Intake/Output Summary (Last 24 hours) at 09/01/14 0849 Last data filed at 09/01/14 0700  Gross per 24 hour  Intake   1280 ml  Output   1030 ml  Net    250 ml   Exam: Gen: No acute respiratory distress Chest: Clear to auscultation bilaterally without wheezes, rhonchi or crackles,  room air Cardiac: Regular rate and rhythm, S1-S2, no rubs murmurs or gallops, no peripheral edema Abdomen: Soft nontender slightly distended without obvious hepatosplenomegaly, no ascites-now with some bright red blood and melena per rectum Extremities: Symmetrical in appearance without cyanosis, clubbing or effusion-dry gangrene left foot involving toes and plantar surface  Scheduled Meds:  Scheduled Meds: . sodium chloride   Intravenous Once  . amLODipine  5 mg Oral Daily  . atorvastatin  40 mg Oral q1800  . calcitRIOL  2.75 mcg Oral Q T,Th,Sa-HD  . calcium acetate  2,001 mg Oral TID WC  . carvedilol  6.25 mg Oral BID WC  . cinacalcet  60 mg Oral Daily  . colchicine  0.3 mg Oral Once per day on Mon Thu  . darbepoetin (ARANESP) injection - DIALYSIS  60 mcg Intravenous Q Sat-HD  . docusate sodium  100 mg Oral BID  . multivitamin  1 tablet Oral QHS  . pantoprazole  40 mg Oral Daily  . polyethylene glycol  17 g Oral Daily  . predniSONE  5 mg Oral Daily  . sodium chloride  10-40 mL Intracatheter Q12H  . sodium chloride  3 mL Intravenous Q12H   Data Reviewed: Basic Metabolic Panel:  Recent Labs Lab 08/26/14 0840 08/29/14 0520 08/30/14 0440 08/31/14 0500 09/01/14 0336  NA 138 137 139 136* 140  K 4.1 4.9 4.4 4.8 3.6*  CL 96 101 100 99 101  CO2 26 24 26 25 28   GLUCOSE 98 122* 124* 85 63*  BUN 33* 35* 19 34* 16  CREATININE 7.10* 6.69* 4.08* 5.86* 3.10*  CALCIUM  8.4 7.5* 8.0* 8.0* 7.7*  MG  --   --  1.8  --   --   PHOS 4.5  --  3.1 3.9  --    Liver Function Tests:  Recent Labs Lab 08/26/14 0840 08/29/14 0520 08/31/14 0500  AST  --  34  --   ALT  --  14  --   ALKPHOS  --  108  --   BILITOT  --  1.2  --   PROT  --  4.6*  --   ALBUMIN 1.8* 1.7* 1.8*   CBC:  Recent Labs Lab 08/28/14 2133 08/29/14 0520 08/29/14 1600 08/30/14 0440 08/31/14 0500 09/01/14 0336  WBC 20.6* 18.4*  --  25.8* 14.9* 10.1  HGB 8.3* 10.1* 9.7* 9.5* 7.7* 11.0*  HCT 24.7* 29.6* 28.4* 27.9*  22.8* 33.4*  MCV 84.0 85.3  --  84.5 86.7 87.7  PLT 253 123*  --  173 104* 93*   Cardiac Enzymes:  Recent Labs Lab 08/29/14 0520  TROPONINI <0.30   CBG:  Recent Labs Lab 09/01/14 0450 09/01/14 0540 09/01/14 0625 09/01/14 0700 09/01/14 0813  GLUCAP 67* 65* 69* 82 72    Recent Results (from the past 240 hour(s))  Surgical pcr screen     Status: None   Collection Time: 08/26/14  5:20 PM  Result Value Ref Range Status   MRSA, PCR NEGATIVE NEGATIVE Final   Staphylococcus aureus NEGATIVE NEGATIVE Final    Comment:        The Xpert SA Assay (FDA approved for NASAL specimens in patients over 25 years of age), is one component of a comprehensive surveillance program.  Test performance has been validated by [employer] for patients greater than or equal to 4 year old. It is not intended to diagnose infection nor to guide or monitor treatment.      Studies:  Recent x-ray studies have been reviewed in detail by the Attending Physician  Time spent :  [place], ANP [facility] Office  [phone] Pager [phone]  On-Call/Text Page:      [email or web address]      password [email or web address]  If 7PM-7AM, please contact night-coverage [email or web address] Password [email or web address] 09/01/2014, 8:49 AM   LOS: 16 days   I have personally examined this patient and reviewed the entire database. I have reviewed the above note, made any necessary editorial changes, and agree with its content.  Cherene Altes, MD [facility]

## 2014-09-01 NOTE — Progress Notes (Signed)
    Subjective  -   More bloody stool Feet do not hurt currently   Physical Exam:  Dry gangrenous changes to right toes1-3 and left great toe Incisions ok Doppler signals       Assessment/Plan:    Vascular issues stable Appreciate consultants assistance  Lavonn Maxcy IV, V. WELLS 09/01/2014 4:39 PM --  Danley Danker Vitals:   09/01/14 1521  BP: 180/72  Pulse:   Temp:   Resp:     Intake/Output Summary (Last 24 hours) at 09/01/14 1639 Last data filed at 09/01/14 1100  Gross per 24 hour  Intake    753 ml  Output      0 ml  Net    753 ml     Laboratory CBC    Component Value Date/Time   WBC 10.8* 09/01/2014 1025   HGB 11.3* 09/01/2014 1025   HCT 34.2* 09/01/2014 1025   PLT 93* 09/01/2014 1025    BMET    Component Value Date/Time   NA 140 09/01/2014 0336   K 3.6* 09/01/2014 0336   CL 101 09/01/2014 0336   CO2 28 09/01/2014 0336   GLUCOSE 63* 09/01/2014 0336   BUN 16 09/01/2014 0336   CREATININE 3.10* 09/01/2014 0336   CREATININE 7.80* 01/16/2014 1437   CALCIUM 7.7* 09/01/2014 0336   GFRNONAA 21* 09/01/2014 0336   GFRAA 24* 09/01/2014 0336    COAG Lab Results  Component Value Date   INR 1.19 08/29/2014   INR 1.23 08/27/2014   INR 1.68* 08/26/2014   No results found for: PTT  Antibiotics Anti-infectives    Start     Dose/Rate Route Frequency Ordered Stop   08/18/14 1800  cefUROXime (ZINACEF) 1.5 g in dextrose 5 % 50 mL IVPB  Status:  Discontinued     1.5 g100 mL/hr over 30 Minutes Intravenous Every 12 hours 08/18/14 1631 08/18/14 1643   08/18/14 1800  cefUROXime (ZINACEF) 1.5 g in dextrose 5 % 50 mL IVPB     1.5 g100 mL/hr over 30 Minutes Intravenous Every 24 hours 08/18/14 1643 08/18/14 1804       V. Leia Alf, M.D. [facility] Office: [phone] Pager:  [phone]

## 2014-09-01 NOTE — Progress Notes (Signed)
This is a tough situation for this patient. He has multiple comorbidities. He is having gross gastrointestinal bleeding. We should continue to hold his dual antiplatelet therapy. He is 78 months out from multiple stents placed in the right coronary. He theoretically is at a moderate increased risk of subacute stent thrombosis, but we are unable to use antiplatelet therapy under these conditions.. Please call if we may be of help. We'll need to resume dual antiplatelet therapy if possible after bleeding is controlled.

## 2014-09-01 NOTE — Progress Notes (Signed)
Physical Therapy Treatment Patient Details Name: [name] MRN: [id number] DOB: Sep 30, 1960 Today's Date: 09/01/2014    History of Present Illness [name] is a 53 y.o. AA male with a history of peripheral vascular disease, CAD s/p PCI in 12/2013,  CHF, Hepatitis C, SBO and ESRD. He had angioplasty of the right popliteal artery per Dr. Trula Slade on 11/11, but returned to the ED 11/22 with worsening right foot pain and was found to have significant plaque with reduced flows throughout the RLE. 11/25 Pt was unable to stand to attend dialysis secondary to severe right foot pain and was admitted for management.  S/p R fem-pop BPG 11/27    PT Comments    Patient not feeling well today and deferred out of bed activities.  Patient agreeable to perform some bed exercises for strengthening.  Patient will continue to need PT in hospital to advance mobility and independence for return home.  Will continue to follow.  Follow Up Recommendations  Home health PT     Equipment Recommendations  3in1 (PT);Rolling walker with 5" wheels    Recommendations for Other Services       Precautions / Restrictions Precautions Precautions: Fall Restrictions Weight Bearing Restrictions: No    Mobility  Bed Mobility               General bed mobility comments: patient deferred mobility due to not feeling well and abdominal cramping  Transfers                    Ambulation/Gait                 Stairs            Wheelchair Mobility    Modified Rankin (Stroke Patients Only)       Balance                                    Cognition Arousal/Alertness: Awake/alert Behavior During Therapy: WFL for tasks assessed/performed Overall Cognitive Status: Within Functional Limits for tasks assessed                      Exercises General Exercises - Lower Extremity Ankle Circles/Pumps: AROM;Both;10 reps;Supine Quad Sets: AROM;Both;10  reps;Supine Gluteal Sets: AROM;Both;10 reps;Supine Short Arc Quad: AROM;Both;10 reps;Supine Heel Slides: AROM;Both;10 reps;Supine    General Comments        Pertinent Vitals/Pain Pain Score: 6  Pain Location: abdomen Pain Descriptors / Indicators: Cramping Pain Intervention(s): Limited activity within patient's tolerance    Home Living                      Prior Function            PT Goals (current goals can now be found in the care plan section) Progress towards PT goals: Not progressing toward goals - comment (limited today by abdominal pain)    Frequency  Min 3X/week    PT Plan Current plan remains appropriate    Co-evaluation             End of Session   Activity Tolerance: Patient limited by pain Patient left: in bed;with call bell/phone within reach     Time: 1420-1435 PT Time Calculation (min) (ACUTE ONLY): 15 min  Charges:  $Therapeutic Exercise: 8-22 mins  G CodesShanna Cisco 2014-09-23, 2:38 PM  09/23/14 [place], [place]

## 2014-09-01 NOTE — Progress Notes (Signed)
Assessment: 1. Acute GI bleed - recurrent Per GI. Possible colonscopy 2. Ischemic R foot, s/p R fem-pop 11/27 3. Ischemic L foot s/p L popliteal angioplasty x 2 per [facility] (April then October 2015) 4. Left leg edema - resolved, s/p venoplasty / stenting of left CF vein 09/03/14 5. ESRD on HD - no hep HD for now given GIB,TTS-HD today. Will give PRBCs 6. HTN/volume - BP improved.  7. Hx CAD s/p PCI/ stents - on NTG patch 8. Anemia w/ABLA  Subjective: Interval History: more abd discomfort with fullness with eating or drinking, some BRBPR  Objective: Vital signs in last 24 hours: Temp:  [97.4 F (36.3 C)-98.2 F (36.8 C)] 98 F (36.7 C) (12/11 1143) Pulse Rate:  [69-83] 83 (12/11 0400) Resp:  [15-23] 23 (12/11 1143) BP: (149-191)/(25-129) 170/88 mmHg (12/11 1143) SpO2:  [92 %-99 %] 99 % (12/11 1143) Weight:  [50.2 kg (110 lb 10.7 oz)-50.621 kg (111 lb 9.6 oz)] 50.621 kg (111 lb 9.6 oz) (12/11 0400) Weight change: -0.3 kg (-10.6 oz)  Intake/Output from previous day: 12/10 0701 - 12/11 0700 In: 1290 [P.O.:460; I.V.:160; Blood:670] Out: 1030  Intake/Output this shift: Total I/O In: 253 [P.O.:200; I.V.:53] Out: -   General appearance: alert and cooperative Extremities: necrotic toes, tr edema   Lab Results:  Recent Labs  09/01/14 0336 09/01/14 1025  WBC 10.1 10.8*  HGB 11.0* 11.3*  HCT 33.4* 34.2*  PLT 93* 93*   BMET:  Recent Labs  08/31/14 0500 09/01/14 0336  NA 136* 140  K 4.8 3.6*  CL 99 101  CO2 25 28  GLUCOSE 85 63*  BUN 34* 16  CREATININE 5.86* 3.10*  CALCIUM 8.0* 7.7*   No results for input(s): PTH in the last 72 hours. Iron Studies: No results for input(s): IRON, TIBC, TRANSFERRIN, FERRITIN in the last 72 hours. Studies/Results: No results found.  Scheduled: . sodium chloride   Intravenous Once  . amLODipine  5 mg Oral Daily  . atorvastatin  40 mg Oral q1800  . calcitRIOL  2.75 mcg Oral Q T,Th,Sa-HD  . calcium acetate  2,001 mg Oral TID WC   . carvedilol  6.25 mg Oral BID WC  . cinacalcet  60 mg Oral Daily  . colchicine  0.3 mg Oral Once per day on Mon Thu  . darbepoetin (ARANESP) injection - DIALYSIS  60 mcg Intravenous Q Sat-HD  . docusate sodium  100 mg Oral BID  . metoCLOPramide (REGLAN) injection  10 mg Intravenous TID  . multivitamin  1 tablet Oral QHS  . pantoprazole  40 mg Oral Daily  . polyethylene glycol  17 g Oral Daily  . predniSONE  5 mg Oral Daily  . sodium chloride  10-40 mL Intracatheter Q12H  . sodium chloride  3 mL Intravenous Q12H     LOS: 16 days   Lior Hoen C 09/01/2014,12:17 PM

## 2014-09-02 ENCOUNTER — Encounter (HOSPITAL_COMMUNITY)
Admission: AD | Disposition: A | Discharge status: Home Health | Payer: Self-pay | Source: Ambulatory Visit | Attending: Surgery

## 2014-09-02 DIAGNOSIS — K921 Melena: Secondary | ICD-10-CM | POA: Insufficient documentation

## 2014-09-02 LAB — CBC
HCT: 32.6 % — ABNORMAL LOW (ref 39.0–52.0)
Hemoglobin: 10.7 g/dL — ABNORMAL LOW (ref 13.0–17.0)
MCH: 28.6 pg (ref 26.0–34.0)
MCHC: 32.8 g/dL (ref 30.0–36.0)
MCV: 87.2 fL (ref 78.0–100.0)
PLATELETS: 91 10*3/uL — AB (ref 150–400)
RBC: 3.74 MIL/uL — AB (ref 4.22–5.81)
RDW: 17 % — ABNORMAL HIGH (ref 11.5–15.5)
WBC: 12 10*3/uL — ABNORMAL HIGH (ref 4.0–10.5)

## 2014-09-02 LAB — BASIC METABOLIC PANEL
ANION GAP: 12 (ref 5–15)
BUN: 23 mg/dL (ref 6–23)
CALCIUM: 7.5 mg/dL — AB (ref 8.4–10.5)
CO2: 27 meq/L (ref 19–32)
CREATININE: 4.76 mg/dL — AB (ref 0.50–1.35)
Chloride: 100 mEq/L (ref 96–112)
GFR calc Af Amer: 15 mL/min — ABNORMAL LOW (ref >90)
GFR calc non Af Amer: 13 mL/min — ABNORMAL LOW (ref >90)
Glucose, Bld: 71 mg/dL (ref 70–99)
Potassium: 3.8 mEq/L (ref 3.7–5.3)
Sodium: 139 mEq/L (ref 137–147)

## 2014-09-02 LAB — GLUCOSE, CAPILLARY
GLUCOSE-CAPILLARY: 76 mg/dL (ref 70–99)
GLUCOSE-CAPILLARY: 91 mg/dL (ref 70–99)
Glucose-Capillary: 62 mg/dL — ABNORMAL LOW (ref 70–99)
Glucose-Capillary: 63 mg/dL — ABNORMAL LOW (ref 70–99)
Glucose-Capillary: 63 mg/dL — ABNORMAL LOW (ref 70–99)
Glucose-Capillary: 75 mg/dL (ref 70–99)
Glucose-Capillary: 87 mg/dL (ref 70–99)
Glucose-Capillary: 94 mg/dL (ref 70–99)

## 2014-09-02 SURGERY — COLONOSCOPY
Anesthesia: Moderate Sedation

## 2014-09-02 MED ORDER — AMLODIPINE BESYLATE 10 MG PO TABS
10.0000 mg | ORAL_TABLET | Freq: Every day | ORAL | Status: DC
  Administered 2014-09-03 – 2014-09-05 (×3): 10 mg via ORAL
  Filled 2014-09-02 (×3): qty 1

## 2014-09-02 MED ORDER — MORPHINE SULFATE 2 MG/ML IJ SOLN
INTRAMUSCULAR | Status: AC
  Administered 2014-09-02: 2 mg via INTRAVENOUS
  Filled 2014-09-02: qty 1

## 2014-09-02 MED ORDER — SIMETHICONE 40 MG/0.6ML PO SUSP
40.0000 mg | Freq: Four times a day (QID) | ORAL | Status: DC
  Administered 2014-09-02 – 2014-09-05 (×10): 40 mg via ORAL
  Filled 2014-09-02 (×16): qty 0.6

## 2014-09-02 MED ORDER — DARBEPOETIN ALFA 60 MCG/0.3ML IJ SOSY
PREFILLED_SYRINGE | INTRAMUSCULAR | Status: AC
  Filled 2014-09-02: qty 0.3

## 2014-09-02 MED ORDER — HYDRALAZINE HCL 10 MG PO TABS: 10.0000 mg | ORAL_TABLET | Freq: Two times a day (BID) | ORAL | Status: DC

## 2014-09-02 NOTE — Progress Notes (Signed)
Patient encouraged throughout the night to drink Movieprep but patient refuses to drink. Patient educated on purpose and importance of drinking Moviprep. He has consumed less than 50g. Lenon Oms 09/02/2014 4:27 AM

## 2014-09-02 NOTE — Progress Notes (Signed)
Pt to go to dialysis this afternoon.

## 2014-09-02 NOTE — Progress Notes (Signed)
Report called to [place], [place] on 6E. Pt is stable at the time of discharge and is aware of the transfer. Pt is to be transferred to [place] [facility]

## 2014-09-02 NOTE — Progress Notes (Signed)
MD Henrene Pastor paged about pt's refusal to drink colonoscopy prep. MD ordered to cancel procedure for today.  Endoscopy aware.

## 2014-09-02 NOTE — Progress Notes (Signed)
Patient ID: [name], male   DOB: [birth year], 53 y.o.   MRN: [id number]  [facility] Progress Note  Subjective: Colonoscopy cancelled this am as would not drink prep, does not want colonoscopy, feels bloated and uncomfortable  if puts too much in stomach. Nurses reported stool was brown this am - no overt bleeding [id number]  Objective:  Vital signs in last 24 hours: Temp:  [98.6 F (37 C)-99.7 F (37.6 C)] 98.8 F (37.1 C) (12/12 1200) Pulse Rate:  [81-94] 86 (12/12 1200) Resp:  [15-26] 18 (12/12 1200) BP: (132-206)/(46-114) 165/82 mmHg (12/12 1200) SpO2:  [94 %-100 %] 97 % (12/12 1200) Last BM Date: 09/02/14 General:   Alert,  Well-developed, thin aa male    in NAD Heart:  Regular rate and rhythm; no murmurs Pulm;clear ant Abdomen:  Soft,  Mildly tender lower abdomen,and nondistended. Normal bowel sounds, without guarding, and without rebound.   Extremities:  Without edema. Neurologic:  Alert and  oriented x4;  grossly normal neurologically. Psych:  Alert and cooperative. Normal mood and affect.  Intake/Output from previous day: 12/11 0701 - 12/12 0700 In: 260.2 [P.O.:200; I.V.:60.2] Out: -  Intake/Output this shift: Total I/O In: 200 [P.O.:200] Out: -   Lab Results:  Recent Labs  09/01/14 0336 09/01/14 1025 09/02/14 0230  WBC 10.1 10.8* 12.0*  HGB 11.0* 11.3* 10.7*  HCT 33.4* 34.2* 32.6*  PLT 93* 93* 91*   BMET  Recent Labs  08/31/14 0500 09/01/14 0336 09/02/14 0230  NA 136* 140 139  K 4.8 3.6* 3.8  CL 99 101 100  CO2 25 28 27   GLUCOSE 85 63* 71  BUN 34* 16 23  CREATININE 5.86* 3.10* 4.76*  CALCIUM 8.0* 7.7* 7.5*   LFT  Recent Labs  08/31/14 0500  ALBUMIN 1.8*     Assessment / Plan: #1 53 yo male with multiple serious medical problems with ischemic colitis by Ct who was to undergo colonoscopy today because of persistent bleeding. Colon cancelled as pt would not drink prep  He is not actively bleeding, had brown stool this  am Do not plan to reschedule colonoscopy at this time as not likely to change management Advance to full liquid diet Follow HGb Gi will sign off  Active Problems:   Peripheral vascular disease   CAD (coronary artery disease), native coronary artery   Essential hypertension   Acute GI hemorrhage   Abdominal pain, left lower quadrant   ESRD on dialysis   Hemorrhagic shock   Acute GI bleeding   Ischemic colitis     LOS: 17 days   Amy Esterwood  09/02/2014, 12:06 PM   GI ATTENDING  Interval history data reviewed. Patient personally seen and examined. The patient is not agreeable colonoscopy. Fortunately, he is having BROWN STOOL. Abdomen benign. Subjective bloating complaint continues. Recommend one dose of daily MiraLAX to keep his bowels regulated. Transfuse as needed for anemia. Treatment of multiple chronic medical problems per primary services. GI is available as needed for this patient. Plan discussed with patient. Will sign off.  Docia Chuck. Geri [place]in[place]., M.D. [facility] Division of Gastroenterology

## 2014-09-02 NOTE — Progress Notes (Signed)
[name]  [name] DPO:[id number] DOB: [birth year] DOA: 08/16/2014 PCP: Placido Sou, MD  Brief narrative: 53 year old male patient with known vascular disease, chronic kidney disease on dialysis, hypertension, chronic steroids for gout, and protein calorie malnutrition. He has a chronic wound of the right lower extremity followed by Dr. Trula Slade with prior revascularizations. He presented to the ER with significant right foot pain on 11/22. An arterial duplex was performed while in the ER noted no significant changes so the patient was discharged home. The following day he continued to have pain in the leg especially with activity. He notified his vascular surgeon's office and was scheduled for arteriogram on 11/24 as an outpatient. Unfortunately he continued to have pain and was subsequently admitted to the hospital on 11/25 by the vascular surgical team.  After admission patient underwent right femoral to below the knee popliteal artery bypass graft on 11/27. Cardiology was consulted preoperatively for clearance. Nephrology was also consulted and patient continued on his usual dialysis treatments. In the postoperative period patient had relative hypotension but maintained appropriate doppler signals to the right lower extremity. On postop day 2 he was started on Lovenox.  He also was eventually restarted on his Plavix.  He stabilized and was transferred to a telemetry unit.  Patient additionally underwent left thigh shuntogram with subsequent venoplasty and stenting of the left common femoral vein on 12/3. Previous left foot swelling improved remarkably after this procedure. Patient continues with dry gangrene of the left great toe and plantar surface of the foot which is stable according to the vascular surgery team. Plans were for the patient to eventually discharged home with home health PT.  By 12/3 he began having multiple bowel movements. By  12/5 patient developed hematochezia prompting a gastroenterology evaluation. It was suspected patient's symptoms were related to ischemic colitis.  He was transfered to the ICU. Patient was transfused and monitored closely. He did require pressors to treat hemorrhagic shock. He received a total of 9 units of packed red blood cells and 4 units of FFP. Aspirin and Plavix as well as other anticoagulation were held. Patient was not receiving heparin with dialysis. Frank bleeding resolved and he was able to be transferred to the stepdown unit. As of 12/10 patient was still passing dark stools. On the morning of 12/11 bright red stool resumed and patient had worsening sensation of bloating without abdominal pain.   HPI/Subjective: Was scheduled for colonoscopy today, but refused to complete his prep last night so the procedure had to be canceled.  C/o ongoing abdom bloating causing nausea and anorexia.  Denies cp, sob, vomiting.  States he had a bowel movement early this morning but does not believe it was bloodly or melanotic.    Assessment/Plan:  Hemorrhagic shock shock has resolved - pt is hemodynamically stable   Lower GI bleed presumed to be related to ischemic colitis GI following - planned to pursue colo on 12/12 but pt refused to comply w/ the prep - has a history of diverticulosis - cont to follow clinically at pt will not allow colo presently   Acute blood loss anemia Has received total of 11 units of packed red blood cells this admission - Hgb currently holding steady around 10-11 - follow in serial fashion   Moderate Hypoglycemia Not on insulin or oral DM meds - due to anorexia - continue when necessary IV dextrose based on frequent CBG checks - avoid continuous IVF in  this pt w/ ESRD   ESRD on dialysis Nephrology following - HD on Tuesdays Thursdays and Saturdays  HTN Currently poorly controlled - adjust tx and follow if remains elevated after HD   Peripheral vascular disease w/  ischemia of L and R feet  chronic dry gangrene of left foot - status post right femoropopliteal bypass on 11/27 - Vas Surg following  Severe protein calorie malnutrition exacerbated by abdominal bloating and resultant anorexia - continue Nepro as tolerated - give trial of simethicone   History of CAD with prior drug-eluting stent April 2015 no acute cardiac issue presently - aspirin and Plavix on hold due to recurrent GI bleeding, but wish to resume asap - continue Lipitor  History of gout on chronic prednisone Continue maintenance prednisone - well controlled   DVT prophylaxis: SCDs Code Status: FULL Family Communication: No family at bedside Disposition Plan/Expected LOS: stable for transfer to renal floor - cont to trend Hgb - attempt to advance diet as able - PT/OT  Consultants: Cardiology-signed off Nephrology PCCM - signed off Gastroenterology Vasc Surgery  Procedures: 11/27 right femoral to below-knee popliteal artery bypass graft  12/3 left thigh shuntogram of AV shunt with venoplasty and stent of left common femoral vein  Antibiotics: none  Objective: Blood pressure 182/68, pulse 89, temperature 98.6 F (37 C), temperature source Oral, resp. rate 23, height 5\' 11"  (1.803 m), weight 50.621 kg (111 lb 9.6 oz), SpO2 97 %.  Intake/Output Summary (Last 24 hours) at 09/02/14 1751 Last data filed at 09/02/14 0600  Gross per 24 hour  Intake 240.17 ml  Output      0 ml  Net 240.17 ml   Exam: Gen: No acute respiratory distress Chest: Clear to auscultation bilaterally without wheezes, rhonchi or crackles, room air Cardiac: Regular rate and rhythm, no rubs murmurs or gallops Abdomen: thin but protuberant - bs high pitched - mildly tender to palpation diffusely - no appreciable mass or rebound  Extremities: dry gangrene left foot involving toes and plantar surface is odiferous - no LE edema, but 1+ B UE edema noted   Scheduled Meds:  Scheduled Meds: . amLODipine  5 mg  Oral Daily  . atorvastatin  40 mg Oral q1800  . calcitRIOL  2.75 mcg Oral Q T,Th,Sa-HD  . calcium acetate  2,001 mg Oral TID WC  . carvedilol  6.25 mg Oral BID WC  . cinacalcet  60 mg Oral Daily  . colchicine  0.3 mg Oral Once per day on Mon Thu  . darbepoetin (ARANESP) injection - DIALYSIS  60 mcg Intravenous Q Sat-HD  . docusate sodium  100 mg Oral BID  . metoCLOPramide (REGLAN) injection  10 mg Intravenous TID  . multivitamin  1 tablet Oral QHS  . pantoprazole  40 mg Oral Daily  . polyethylene glycol  17 g Oral Daily  . predniSONE  5 mg Oral Daily  . sodium chloride  10-40 mL Intracatheter Q12H  . sodium chloride  3 mL Intravenous Q12H   Data Reviewed: Basic Metabolic Panel:  Recent Labs Lab 08/29/14 0520 08/30/14 0440 08/31/14 0500 09/01/14 0336 09/02/14 0230  NA 137 139 136* 140 139  K 4.9 4.4 4.8 3.6* 3.8  CL 101 100 99 101 100  CO2 24 26 25 28 27   GLUCOSE 122* 124* 85 63* 71  BUN 35* 19 34* 16 23  CREATININE 6.69* 4.08* 5.86* 3.10* 4.76*  CALCIUM 7.5* 8.0* 8.0* 7.7* 7.5*  MG  --  1.8  --   --   --  PHOS  --  3.1 3.9  --   --    Liver Function Tests:  Recent Labs Lab 08/29/14 0520 08/31/14 0500  AST 34  --   ALT 14  --   ALKPHOS 108  --   BILITOT 1.2  --   PROT 4.6*  --   ALBUMIN 1.7* 1.8*   CBC:  Recent Labs Lab 08/30/14 0440 08/31/14 0500 09/01/14 0336 09/01/14 1025 09/02/14 0230  WBC 25.8* 14.9* 10.1 10.8* 12.0*  NEUTROABS  --   --   --  8.5*  --   HGB 9.5* 7.7* 11.0* 11.3* 10.7*  HCT 27.9* 22.8* 33.4* 34.2* 32.6*  MCV 84.5 86.7 87.7 88.1 87.2  PLT 173 104* 93* 93* 91*   Cardiac Enzymes:  Recent Labs Lab 08/29/14 0520  TROPONINI <0.30   CBG:  Recent Labs Lab 09/01/14 2334 09/02/14 0024 09/02/14 0818 09/02/14 0819 09/02/14 0835  GLUCAP 64* 87 63* 63* 75    Recent Results (from the past 240 hour(s))  Surgical pcr screen     Status: None   Collection Time: 08/26/14  5:20 PM  Result Value Ref Range Status   MRSA, PCR  NEGATIVE NEGATIVE Final   Staphylococcus aureus NEGATIVE NEGATIVE Final    Comment:        The Xpert SA Assay (FDA approved for NASAL specimens in patients over 18 years of age), is one component of a comprehensive surveillance program.  Test performance has been validated by [employer] for patients greater than or equal to 47 year old. It is not intended to diagnose infection nor to guide or monitor treatment.      Studies:  Recent x-ray studies have been reviewed in detail by the Attending Physician  Time spent :  8 mins   Cherene Altes, MD [facility] For Consults/Admissions - Flow Manager - [phone] Office  [phone] Pager [phone]  On-Call/Text Page:      [email or web address]      password [facility]  09/02/2014, 9:23 AM   LOS: 17 days

## 2014-09-02 NOTE — Progress Notes (Signed)
Dr. Scot Dock looked at incision site.  Clean with NS pack with 2x2's wet to dry and cover with 4x4 and tape.

## 2014-09-02 NOTE — Progress Notes (Signed)
Assessment: 1. Acute GI bleed - recurrent Per  2. Ischemic R foot, s/p R fem-pop 11/27 3. ESRD on HD - no hep HD for now given GIB,TTS-HD today.  4. Hx CAD s/p PCI/ stents - on NTG patch,off anti-plt agent currently due to GIB 5. Anemia w/ABLA  Subjective: Interval History: Not tolerating colonoscopy prep  Objective: Vital signs in last 24 hours: Temp:  [98 F (36.7 C)-99.7 F (37.6 C)] 98.6 F (37 C) (12/12 0800) Pulse Rate:  [85-94] 89 (12/12 0800) Resp:  [16-26] 23 (12/12 0800) BP: (132-206)/(46-114) 182/68 mmHg (12/12 0800) SpO2:  [94 %-100 %] 97 % (12/12 0800) Weight change:   Intake/Output from previous day: 12/11 0701 - 12/12 0700 In: 260.2 [P.O.:200; I.V.:60.2] Out: -  Intake/Output this shift: Total I/O In: 100 [P.O.:100] Out: -   General appearance: alert and cooperative  Lungs clear anteriorly Cor RRR Abd soft Ext ne  Lab Results:  Recent Labs  09/01/14 1025 09/02/14 0230  WBC 10.8* 12.0*  HGB 11.3* 10.7*  HCT 34.2* 32.6*  PLT 93* 91*   BMET:  Recent Labs  09/01/14 0336 09/02/14 0230  NA 140 139  K 3.6* 3.8  CL 101 100  CO2 28 27  GLUCOSE 63* 71  BUN 16 23  CREATININE 3.10* 4.76*  CALCIUM 7.7* 7.5*   No results for input(s): PTH in the last 72 hours. Iron Studies: No results for input(s): IRON, TIBC, TRANSFERRIN, FERRITIN in the last 72 hours. Studies/Results: No results found.  Scheduled: . amLODipine  5 mg Oral Daily  . atorvastatin  40 mg Oral q1800  . calcitRIOL  2.75 mcg Oral Q T,Th,Sa-HD  . calcium acetate  2,001 mg Oral TID WC  . carvedilol  6.25 mg Oral BID WC  . cinacalcet  60 mg Oral Daily  . colchicine  0.3 mg Oral Once per day on Mon Thu  . darbepoetin (ARANESP) injection - DIALYSIS  60 mcg Intravenous Q Sat-HD  . docusate sodium  100 mg Oral BID  . metoCLOPramide (REGLAN) injection  10 mg Intravenous TID  . multivitamin  1 tablet Oral QHS  . pantoprazole  40 mg Oral Daily  . polyethylene glycol  17 g Oral  Daily  . predniSONE  5 mg Oral Daily  . sodium chloride  10-40 mL Intracatheter Q12H  . sodium chloride  3 mL Intravenous Q12H     LOS: 17 days   [name] C 09/02/2014,9:55 AM

## 2014-09-02 NOTE — Progress Notes (Signed)
   VASCULAR SURGERY ASSESSMENT & PLAN:  * 9 Days Post-Op s/p: Right fempop bypass (PTFE)  *  Stable from vascular standpoint.   SUBJECTIVE: No complaints except paresthesias left foot which is not new.   PHYSICAL EXAM: Filed Vitals:   09/02/14 0200 09/02/14 0311 09/02/14 0400 09/02/14 0604  BP: 171/73 138/59 147/75 166/46  Pulse: 85 94 93 86  Temp:  99.3 F (37.4 C)    TempSrc:  Oral    Resp: 22 26 25 22   Height:      Weight:      SpO2: 100% 96% 95% 94%   Incisions look fine Dry gangrene right foot unchanged. Right foot is warm and well perfused.   LABS: Lab Results  Component Value Date   WBC 12.0* 09/02/2014   HGB 10.7* 09/02/2014   HCT 32.6* 09/02/2014   MCV 87.2 09/02/2014   PLT 91* 09/02/2014   Lab Results  Component Value Date   CREATININE 4.76* 09/02/2014   Lab Results  Component Value Date   INR 1.19 08/29/2014   CBG (last 3)   Recent Labs  09/01/14 2057 09/01/14 2334 09/02/14 0024  GLUCAP 75 64* 87    Active Problems:   Peripheral vascular disease   CAD (coronary artery disease), native coronary artery   Essential hypertension   Acute GI hemorrhage   Abdominal pain, left lower quadrant   ESRD on dialysis   Hemorrhagic shock   Acute GI bleeding   Ischemic colitis   Gae Gallop Beeper: [phone] 09/02/2014

## 2014-09-02 NOTE — Progress Notes (Signed)
Pt right groin incision area looks open, steri-strips looks off.  Spoke with nurse on [facility], states Doctors looked at site and did not give order for dsg, paged Dr. Gae Gallop from [facility], will come and look at it.

## 2014-09-02 NOTE — Progress Notes (Signed)
Cleaned right groin area with NS packed with 1 2x2 and covered with 4x4 and tape.  Dsg clean, dry, intact.

## 2014-09-03 LAB — GLUCOSE, CAPILLARY
GLUCOSE-CAPILLARY: 111 mg/dL — AB (ref 70–99)
GLUCOSE-CAPILLARY: 73 mg/dL (ref 70–99)
Glucose-Capillary: 123 mg/dL — ABNORMAL HIGH (ref 70–99)
Glucose-Capillary: 88 mg/dL (ref 70–99)

## 2014-09-03 MED ORDER — ASPIRIN EC 81 MG PO TBEC
81.0000 mg | DELAYED_RELEASE_TABLET | Freq: Every day | ORAL | Status: DC
  Administered 2014-09-03 – 2014-09-05 (×3): 81 mg via ORAL
  Filled 2014-09-03 (×3): qty 1

## 2014-09-03 MED ORDER — CLOPIDOGREL BISULFATE 75 MG PO TABS
75.0000 mg | ORAL_TABLET | Freq: Every day | ORAL | Status: DC
  Administered 2014-09-04: 75 mg via ORAL
  Filled 2014-09-03 (×4): qty 1

## 2014-09-03 NOTE — Progress Notes (Addendum)
PATIENT DETAILS Name: [name] Age: 53 y.o. Sex: male Date of Birth: 11/29/1960 Admit Date: 08/16/2014 Admitting Physician Serafina Mitchell, MD FXT:[id number],GDJME L, MD  Brief narrative: 53 year old male patient with known vascular disease, chronic kidney disease on dialysis, hypertension, chronic steroids for gout brought to the emergency room on 08/16/14 with severe right leg pain, evaluated by vascular surgery, and underwent right femoral to below the knee popliteal artery bypass graft on 11/27. Subsequently patient developed significant left leg swelling, with concerns for left venous stenosis related to his left thigh arteriovenous graft, on 12/3 patient underwentleft thigh shuntogram with subsequent venoplasty and stenting of the left common femoral vein.Previous left foot swelling improved remarkably after this procedure. On 12/5 patient developed significant hematochezia, unfortunately developed hemorrhagic shock and required ICU transfer. He received a total of 11 units of PRBC, 4 units of FFP and temporarily required pressors to maintain blood pressure. Aspirin/Plavix were placed on hold. He slowly stabilized, hematochezia completely resolved, on 12/11 he started having brown bowel movements. He was briefly monitored in the stepdown unit, and transferred to my care on 09/03/14  Subjective: Generalized weakness-but no specific complaints. Had browb stools with BM last night.  Assesment/Plan: Active Problems: Hemorrhagic shock: Secondary to lower GI bleed.Shock has resolved - pt is hemodynamically stable   Lower GI bleed: presumed to be related to ischemic colitis. GI consulted and was following. Plans were to pursue colonoscopy, however patient refused to comply with the preparation. Bleeding has completely resolved, patient passing brown stools. No further recommendations from gastroenterology, spoke with Dr. Henrene Pastor on 12/13 okay to resume aspirin/Plavix. Follow  clinically.  Acute blood loss anemia: Secondary to above. Hemoglobin stable. Has received a total of 11 units of PRBC per prior notes. Follow hemoglobin.  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 No further left leg swelling.  ESRD on dialysis:Nephrology following - HD on Tuesdays Thursdays and Saturdays  History of CAD with prior drug-eluting stent April 2015:no acute cardiac issue presently - aspirin and Plavix held due to recurrent GI bleeding,since GI bleeding seems to have resolved, spoke with Dr Henrene Pastor on 12/13-advised to resume. Continue Lipitor  HTN: Controlled, continue with amlodipine, Coreg  History of gout: continue chronic prednisone.Continue Colchicine  Hypoglycemia:encourage oral intake-suspect this is secondary to acute illness/poor oral itnake. Monitor CBGs.  Severe protein calorie malnutrition:c/w supplements  Disposition: Remain inpatient  Antibiotics:  See below   Anti-infectives    Start     Dose/Rate Route Frequency Ordered Stop   08/18/14 1800  cefUROXime (ZINACEF) 1.5 g in dextrose 5 % 50 mL IVPB  Status:  Discontinued     1.5 g100 mL/hr over 30 Minutes Intravenous Every 12 hours 08/18/14 1631 08/18/14 1643   08/18/14 1800  cefUROXime (ZINACEF) 1.5 g in dextrose 5 % 50 mL IVPB     1.5 g100 mL/hr over 30 Minutes Intravenous Every 24 hours 08/18/14 1643 08/18/14 1804      DVT Prophylaxis:  SCD's  Code Status: Full code  Family Communication None at bedside  Procedures: 11/27 right femoral to below-knee popliteal artery bypass graft 12/3 left thigh shuntogram of AV shunt with venoplasty and stent of left common femoral vein  CONSULTS: Cardiology-signed  off Nephrology PCCM - signed off Gastroenterology-signed off Vasc Surgery  MEDICATIONS: Scheduled Meds: . amLODipine  10 mg Oral Daily  . atorvastatin  40 mg Oral q1800  . calcitRIOL  2.75 mcg Oral Q T,Th,Sa-HD  . calcium acetate  2,001 mg Oral TID WC  . carvedilol  6.25 mg Oral BID WC  . cinacalcet  60 mg Oral Daily  . colchicine  0.3 mg Oral Once per day on Mon Thu  . darbepoetin (ARANESP) injection - DIALYSIS  60 mcg Intravenous Q Sat-HD  . docusate sodium  100 mg Oral BID  . metoCLOPramide (REGLAN) injection  10 mg Intravenous TID  . multivitamin  1 tablet Oral QHS  . pantoprazole  40 mg Oral Daily  . polyethylene glycol  17 g Oral Daily  . predniSONE  5 mg Oral Daily  . simethicone  40 mg Oral QID   Continuous Infusions: . sodium chloride Stopped (09/01/14 1143)   PRN Meds:.sodium chloride, acetaminophen **OR** acetaminophen, feeding supplement (NEPRO CARB STEADY), guaiFENesin-dextromethorphan, hydrALAZINE, lidocaine (PF), lidocaine-prilocaine, morphine injection, ondansetron, oxyCODONE, pentafluoroprop-tetrafluoroeth, phenol, senna-docusate, sodium chloride    PHYSICAL EXAM: Vital signs in last 24 hours: Filed Vitals:   09/02/14 2258 09/02/14 2336 09/03/14 0601 09/03/14 0942  BP: 157/56 103/51 122/70 136/78  Pulse: 78 75 76 72  Temp: 97.9 F (36.6 C) 98 F (36.7 C) 99 F (37.2 C) 98.5 F (36.9 C)  TempSrc: Oral   Oral  Resp: 18 17 17 16   Height:      Weight:      SpO2:  97% 95% 96%    Weight change:  Filed Weights   08/31/14 2000 09/01/14 0400 09/02/14 2052  Weight: 50.4 kg (111 lb 1.8 oz) 50.621 kg (111 lb 9.6 oz) 50.803 kg (112 lb)   Body mass index is 15.63 kg/(m^2).   Gen Exam: Awake and alert with clear speech. Deconditioned. Neck: Supple, No JVD.   Chest: B/L Clear.   CVS: S1 S2 Regular, no murmurs.  Abdomen: soft, BS +, non tender, non distended.  Extremities: no edema Neurologic: Non Focal.   Skin: No Rash.     Intake/Output from  previous day:  Intake/Output Summary (Last 24 hours) at 09/03/14 1347 Last data filed at 09/02/14 2258  Gross per 24 hour  Intake      0 ml  Output    -31 ml  Net     31 ml     LAB RESULTS: CBC  Recent Labs Lab 08/30/14 0440 08/31/14 0500 09/01/14 0336 09/01/14 1025 09/02/14 0230  WBC 25.8* 14.9* 10.1 10.8* 12.0*  HGB 9.5* 7.7* 11.0* 11.3* 10.7*  HCT 27.9* 22.8* 33.4* 34.2* 32.6*  PLT 173 104* 93* 93* 91*  MCV 84.5 86.7 87.7 88.1 87.2  MCH 28.8 29.3 28.9 29.1 28.6  MCHC 34.1 33.8 32.9 33.0 32.8  RDW 16.4* 17.1* 16.3* 16.6* 17.0*  LYMPHSABS  --   --   --  1.1  --   MONOABS  --   --   --  1.0  --   EOSABS  --   --   --  0.1  --   BASOSABS  --   --   --  0.0  --     Chemistries   Recent Labs Lab 08/29/14 0520 08/30/14 0440 08/31/14 0500 09/01/14 0336 09/02/14 0230  NA 137 139 136* 140 139  K 4.9 4.4 4.8 3.6* 3.8  CL 101 100 99 101 100  CO2 24 26 25  28 27  GLUCOSE 122* 124* 85 63* 71  BUN 35* 19 34* 16 23  CREATININE 6.69* 4.08* 5.86* 3.10* 4.76*  CALCIUM 7.5* 8.0* 8.0* 7.7* 7.5*  MG  --  1.8  --   --   --     CBG:  Recent Labs Lab 09/02/14 1230 09/02/14 1637 09/02/14 2335 09/03/14 0555 09/03/14 1205  GLUCAP 76 94 91 73 123*    GFR Estimated Creatinine Clearance: 12.6 mL/min (by C-G formula based on Cr of 4.76).  Coagulation profile  Recent Labs Lab 08/29/14 0520  INR 1.19    Cardiac Enzymes  Recent Labs Lab 08/29/14 0520  TROPONINI <0.30    Invalid input(s): POCBNP No results for input(s): DDIMER in the last 72 hours. No results for input(s): HGBA1C in the last 72 hours. No results for input(s): CHOL, HDL, LDLCALC, TRIG, CHOLHDL, LDLDIRECT in the last 72 hours. No results for input(s): TSH, T4TOTAL, T3FREE, THYROIDAB in the last 72 hours.  Invalid input(s): FREET3 No results for input(s): VITAMINB12, FOLATE, FERRITIN, TIBC, IRON, RETICCTPCT in the last 72 hours. No results for input(s): LIPASE, AMYLASE in the last 72  hours.  Urine Studies No results for input(s): UHGB, CRYS in the last 72 hours.  Invalid input(s): UACOL, UAPR, USPG, UPH, UTP, UGL, UKET, UBIL, UNIT, UROB, ULEU, UEPI, UWBC, URBC, UBAC, CAST, UCOM, BILUA  MICROBIOLOGY: Recent Results (from the past 240 hour(s))  Surgical pcr screen     Status: None   Collection Time: 08/26/14  5:20 PM  Result Value Ref Range Status   MRSA, PCR NEGATIVE NEGATIVE Final   Staphylococcus aureus NEGATIVE NEGATIVE Final    Comment:        The Xpert SA Assay (FDA approved for NASAL specimens in patients over 86 years of age), is one component of a comprehensive surveillance program.  Test performance has been validated by [employer] for patients greater than or equal to 11 year old. It is not intended to diagnose infection nor to guide or monitor treatment.     RADIOLOGY STUDIES/RESULTS: Ct Abdomen Pelvis W Contrast  08/27/2014   CLINICAL DATA:  Acute onset of bright red blood per rectum, with periumbilical cramping abdominal pain. Initial encounter.  EXAM: CT ABDOMEN AND PELVIS WITH CONTRAST  TECHNIQUE: Multidetector CT imaging of the abdomen and pelvis was performed using the standard protocol following bolus administration of intravenous contrast.  CONTRAST:  175mL OMNIPAQUE IOHEXOL 300 MG/ML  SOLN  COMPARISON:  CT of the abdomen and pelvis from 11/10/2012  FINDINGS: Trace bilateral pleural effusions are seen, with bibasilar atelectasis.  A nonspecific 6 mm hypodensity within the left hepatic lobe is of uncertain significance. The liver is otherwise unremarkable. The spleen is mildly bulky, but remains normal in length. There is persistent chronic gallbladder wall thickening. The gallbladder is difficult to fully assess given trace ascites tracking about the liver.  The pancreas and adrenal glands are unremarkable.  The patient's native kidneys are markedly atrophic, with scattered bilateral renal cysts of varying size and attenuation. Scattered  associated calcifications are seen. There is no evidence of hydronephrosis. No obstructing renal stones are seen.  There is marked fatty infiltration with regard to the transplant kidney at the left iliac fossa, with marked thinning of the renal parenchyma. Diffuse associated vascular calcifications are seen. This appearance is stable from 2014.  There is mild diffuse mesenteric and omental edema, nonspecific in appearance.  The stomach is within normal limits. No acute vascular abnormalities are seen. Diffuse calcification  is seen along the abdominal aorta and its branches. Extensive diffuse vascular calcification is noted throughout the abdomen and pelvis.  The patient appears to be status post resection of much of the bowel. Remaining small bowel is grossly unremarkable. A distended anastomosis is noted at the right hemipelvis; this corresponds to the site of the prior right pelvic catheter, which has been removed.  The appendix is not definitely characterized; there is no evidence for appendicitis. Contrast progresses to the level of the proximal sigmoid colon. There is scattered diverticulosis along the transverse colon. Stool is noted filling the distal sigmoid colon and rectum.  There is soft tissue inflammation about the distal sigmoid colon and rectum, raising question for proctitis. Diffuse presacral stranding is also noted, new from the prior study.  The bladder is not well assessed. Soft tissue density about the expected location of the bladder is relatively stable in appearance. 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 This is perhaps slightly more prominent than on the prior study. No inguinal lymphadenopathy is seen.  No acute osseous abnormalities are identified.  IMPRESSION: 1. Soft tissue inflammation about the distal sigmoid colon and rectum, with diffuse presacral stranding, new from the prior study. This  raises concern for proctitis, which may explain the patient's bright red blood per rectum. Stool noted filling the distal sigmoid colon and rectum. 2. Scattered diverticulosis along the transverse colon, without evidence of diverticulitis; the colon is otherwise grossly unremarkable. 3. Trace bilateral pleural effusions, with bibasilar atelectasis. 4. Marked atrophy of the patient's bilateral native kidneys, with bilateral renal cysts and scattered calcifications. Marked fatty infiltration of the transplant kidney at the left iliac fossa, with marked thinning of the renal parenchyma. This appearance is stable from 2014. 5. Stable appearance to soft tissue density about the expected location of the bladder. 6. 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 7. Diffuse calcification along the abdominal aorta and its branches. Extensive diffuse vascular calcification seen throughout the abdomen and pelvis. 8. Nonspecific 6 mm hypodensity within the left hepatic lobe, new from prior studies. 9. Persistent chronic gallbladder wall thickening; gallbladder difficult to fully assess given trace ascites about the liver, but this appearance is relatively stable. 10. Mild nonspecific diffuse mesenteric and omental edema.   Electronically Signed   By: Garald Balding M.D.   On: 08/27/2014 01:30   Dg Chest Port 1 View  08/29/2014   CLINICAL DATA:  Central line placement.  EXAM: PORTABLE CHEST - 1 VIEW  COMPARISON:  07/03/2012  FINDINGS: Interval placement of a left central venous catheter. Tip projects to the left mediastinum over the aortic arch. This could be in the brachiocephalic vein, in a normal variant left superior vena cava, or an arterial branch. No pneumothorax. Calcification in the left apex is likely postinflammatory. This is new since previous study. Heart size is normal. Hemidiaphragms are  not included within the field of view.  IMPRESSION: Nonspecific placement of left central venous catheter to the left of midline, suggesting placement either in the brachiocephalic vein, left superior vena cava, or arterial branch. No pneumothorax.   Electronically Signed   By: Lucienne Capers M.D.   On: 08/29/2014 02:17   Dg Abd Portable 1v  08/26/2014   CLINICAL DATA:  Acute GI bleed  EXAM: PORTABLE ABDOMEN - 1 VIEW  COMPARISON:  CT abdomen pelvis dated 11/10/2012  FINDINGS: Nonspecific but nonobstructive bowel gas pattern.  Surgical clips overlying the pelvis.  Vascular calcifications.  IMPRESSION: Unremarkable abdominal radiograph.   Electronically Signed   By: Julian Hy M.D.   On: 08/26/2014 22:30    Oren Binet, MD  [facility] Pager:[phone]  If 7PM-7AM, please contact night-coverage [email or web address] Password [email or web address] 09/03/2014, 1:47 PM   LOS: 18 days

## 2014-09-03 NOTE — Progress Notes (Signed)
Subjective:   Tired, could not get arterial to run during HD yesterday. Says has trouble sometimes when not stuck just right. Will attempt HD tomorrow  Objective Filed Vitals:   09/02/14 2258 09/02/14 2336 09/03/14 0601 09/03/14 0942  BP: 157/56 103/51 122/70 136/78  Pulse: 78 75 76 72  Temp: 97.9 F (36.6 C) 98 F (36.7 C) 99 F (37.2 C) 98.5 F (36.9 C)  TempSrc: Oral   Oral  Resp: 18 17 17 16   Height:      Weight:      SpO2:  97% 95% 96%   Physical Exam General: alert and oriented. No acute distress.  Heart: RRR.  Lungs: CTA, unlabored.  Abdomen: soft, thin,nontender +BS  Extremities: no edema. Dry gangrene R foot. R thigh open incision- with purulent drainage Dialysis Access: L thigh AVG +b/t   HD: [facility] 3.5h 46.5 kg 2K/2Ca 400/800 Profile 2 Heparin 4000 U L thigh AVG  Calcitriol 2.75 mcg Aranesp 25 mcg on Thurs Venofer 100 mg through 12/3, then qwk  Assessment/Plan: 1.  Acute GIB- refused colonoscopy d/t not tolerating prep- hgb stable. Brown stool last night. GI signed off. 2. Ischemic R foot- s/p fem pop 11/27 [facility] following 3. ESRD - [facility]TS [facility], k+3.8- check today. Could not run more than 1 hour d/t difficulty with arterial needle - attempt tomorrow with no heparin 4.. Anemia - hgb 10.7 cont Aranesp Thursday. Watch CBC. Denies blood in stool  5. Secondary hyperparathyroidism - Ca+7.5/9.2 corrected.  cont calcitriol, phoslo 6. HTN/volume - 136/78, no volume excess. Cont norvasc and coreg 7. Nutrition - renal diet. Alb 1.8  Shelle Iron, NP [place] [phone] 09/03/2014,10:57 AM  LOS: 18 days   Renal Abd less upset today. Cont support. Agree with details as articulated above. Chanti Golubski C   Additional Objective Labs: Basic Metabolic Panel:  Recent Labs Lab 08/30/14 0440 08/31/14 0500 09/01/14 0336 09/02/14 0230  NA 139 136* 140 139  K 4.4 4.8 3.6* 3.8  CL 100 99 101 100  CO2 26 25 28 27   GLUCOSE  124* 85 63* 71  BUN 19 34* 16 23  CREATININE 4.08* 5.86* 3.10* 4.76*  CALCIUM 8.0* 8.0* 7.7* 7.5*  PHOS 3.1 3.9  --   --    Liver Function Tests:  Recent Labs Lab 08/29/14 0520 08/31/14 0500  AST 34  --   ALT 14  --   ALKPHOS 108  --   BILITOT 1.2  --   PROT 4.6*  --   ALBUMIN 1.7* 1.8*   No results for input(s): LIPASE, AMYLASE in the last 168 hours. CBC:  Recent Labs Lab 08/30/14 0440 08/31/14 0500 09/01/14 0336 09/01/14 1025 09/02/14 0230  WBC 25.8* 14.9* 10.1 10.8* 12.0*  NEUTROABS  --   --   --  8.5*  --   HGB 9.5* 7.7* 11.0* 11.3* 10.7*  HCT 27.9* 22.8* 33.4* 34.2* 32.6*  MCV 84.5 86.7 87.7 88.1 87.2  PLT 173 104* 93* 93* 91*   Blood Culture    Component Value Date/Time   SDES BLOOD ARM RIGHT 07/05/2012 1400   SPECREQUEST BOTTLES DRAWN AEROBIC ONLY 10CC 07/05/2012 1400   CULT NO GROWTH 5 DAYS 07/05/2012 1400   REPTSTATUS 07/11/2012 FINAL 07/05/2012 1400    Cardiac Enzymes:  Recent Labs Lab 08/29/14 0520  TROPONINI <0.30   CBG:  Recent Labs Lab 09/02/14 1203 09/02/14 1230 09/02/14 1637 09/02/14 2335 09/03/14 0555  GLUCAP 62* 76 94 91 73   Iron Studies:  No results for input(s): IRON, TIBC, TRANSFERRIN, FERRITIN in the last 72 hours. @lablastinr3 @ Studies/Results: No results found. Medications: . sodium chloride Stopped (09/01/14 1143)   . amLODipine  10 mg Oral Daily  . atorvastatin  40 mg Oral q1800  . calcitRIOL  2.75 mcg Oral Q T,Th,Sa-HD  . calcium acetate  2,001 mg Oral TID WC  . carvedilol  6.25 mg Oral BID WC  . cinacalcet  60 mg Oral Daily  . colchicine  0.3 mg Oral Once per day on Mon Thu  . Darbepoetin Alfa      . darbepoetin (ARANESP) injection - DIALYSIS  60 mcg Intravenous Q Sat-HD  . docusate sodium  100 mg Oral BID  . metoCLOPramide (REGLAN) injection  10 mg Intravenous TID  . multivitamin  1 tablet Oral QHS  . pantoprazole  40 mg Oral Daily  . polyethylene glycol  17 g Oral Daily  . predniSONE  5 mg Oral Daily  .  simethicone  40 mg Oral QID

## 2014-09-04 LAB — TYPE AND SCREEN
ABO/RH(D): B POS
Antibody Screen: POSITIVE
DAT, IGG: NEGATIVE
UNIT DIVISION: 0
UNIT DIVISION: 0
UNIT DIVISION: 0
Unit division: 0

## 2014-09-04 LAB — CBC
HEMATOCRIT: 32.5 % — AB (ref 39.0–52.0)
Hemoglobin: 10.3 g/dL — ABNORMAL LOW (ref 13.0–17.0)
MCH: 28.1 pg (ref 26.0–34.0)
MCHC: 31.7 g/dL (ref 30.0–36.0)
MCV: 88.6 fL (ref 78.0–100.0)
PLATELETS: 139 10*3/uL — AB (ref 150–400)
RBC: 3.67 MIL/uL — ABNORMAL LOW (ref 4.22–5.81)
RDW: 16.8 % — AB (ref 11.5–15.5)
WBC: 13.7 10*3/uL — ABNORMAL HIGH (ref 4.0–10.5)

## 2014-09-04 LAB — RENAL FUNCTION PANEL
ALBUMIN: 1.8 g/dL — AB (ref 3.5–5.2)
Anion gap: 15 (ref 5–15)
BUN: 36 mg/dL — ABNORMAL HIGH (ref 6–23)
CHLORIDE: 99 meq/L (ref 96–112)
CO2: 23 mEq/L (ref 19–32)
Calcium: 7.3 mg/dL — ABNORMAL LOW (ref 8.4–10.5)
Creatinine, Ser: 7.55 mg/dL — ABNORMAL HIGH (ref 0.50–1.35)
GFR, EST AFRICAN AMERICAN: 8 mL/min — AB (ref >90)
GFR, EST NON AFRICAN AMERICAN: 7 mL/min — AB (ref >90)
Glucose, Bld: 99 mg/dL (ref 70–99)
Phosphorus: 3.7 mg/dL (ref 2.3–4.6)
Potassium: 4.7 mEq/L (ref 3.7–5.3)
SODIUM: 137 meq/L (ref 137–147)

## 2014-09-04 LAB — GLUCOSE, CAPILLARY
GLUCOSE-CAPILLARY: 101 mg/dL — AB (ref 70–99)
Glucose-Capillary: 87 mg/dL (ref 70–99)
Glucose-Capillary: 118 mg/dL — ABNORMAL HIGH (ref 70–99)

## 2014-09-04 MED ORDER — PENTAFLUOROPROP-TETRAFLUOROETH EX AERO
1.0000 | INHALATION_SPRAY | CUTANEOUS | Status: DC | PRN
Start: 2014-09-04 — End: 2014-09-04

## 2014-09-04 MED ORDER — HEPARIN SODIUM (PORCINE) 1000 UNIT/ML DIALYSIS: 1000.0000 [IU] | INTRAMUSCULAR | Status: DC | PRN

## 2014-09-04 MED ORDER — SODIUM CHLORIDE 0.9 % IV SOLN: 100.0000 mL | INTRAVENOUS | Status: DC | PRN

## 2014-09-04 MED ORDER — LIDOCAINE-PRILOCAINE 2.5-2.5 % EX CREA: 1.0000 "application " | TOPICAL_CREAM | CUTANEOUS | Status: DC | PRN

## 2014-09-04 MED ORDER — SODIUM CHLORIDE 0.9 % IV SOLN
100.0000 mL | INTRAVENOUS | Status: DC | PRN
Start: 2014-09-04 — End: 2014-09-04

## 2014-09-04 MED ORDER — ALTEPLASE 2 MG IJ SOLR: 2.0000 mg | Freq: Once | INTRAMUSCULAR | Status: DC | PRN

## 2014-09-04 MED ORDER — LIDOCAINE HCL (PF) 1 % IJ SOLN: 5.0000 mL | INTRAMUSCULAR | Status: DC | PRN

## 2014-09-04 MED ORDER — NEPRO/CARBSTEADY PO LIQD: 237.0000 mL | ORAL | Status: DC | PRN

## 2014-09-04 MED ORDER — PENTAFLUOROPROP-TETRAFLUOROETH EX AERO
INHALATION_SPRAY | CUTANEOUS | Status: AC
  Administered 2014-09-04: 1 via TOPICAL
  Filled 2014-09-04: qty 103.5

## 2014-09-04 MED ORDER — MORPHINE SULFATE 2 MG/ML IJ SOLN
INTRAMUSCULAR | Status: AC
Start: 2014-09-04 — End: 2014-09-04
  Filled 2014-09-04: qty 1

## 2014-09-04 MED ORDER — LIDOCAINE HCL (PF) 1 % IJ SOLN
5.0000 mL | INTRAMUSCULAR | Status: DC | PRN
Start: 2014-09-04 — End: 2014-09-04

## 2014-09-04 MED ORDER — METOCLOPRAMIDE HCL 10 MG PO TABS: 10.0000 mg | ORAL_TABLET | Freq: Three times a day (TID) | ORAL | Status: DC | PRN

## 2014-09-04 MED ORDER — ALTEPLASE 2 MG IJ SOLR
2.0000 mg | Freq: Once | INTRAMUSCULAR | Status: AC | PRN
  Filled 2014-09-04: qty 2

## 2014-09-04 MED ORDER — PENTAFLUOROPROP-TETRAFLUOROETH EX AERO: 1.0000 "application " | INHALATION_SPRAY | CUTANEOUS | Status: DC | PRN

## 2014-09-04 MED ORDER — HEPARIN SODIUM (PORCINE) 1000 UNIT/ML DIALYSIS: 20.0000 [IU]/kg | INTRAMUSCULAR | Status: DC | PRN

## 2014-09-04 NOTE — Progress Notes (Signed)
Physical Therapy Treatment Patient Details Name: [name] MRN: [id number] DOB: 13-Jul-1961 Today's Date: 09/04/2014    History of Present Illness [name] is a 53 y.o. AA male with a history of peripheral vascular disease, CAD s/p PCI in 12/2013,  CHF, Hepatitis C, SBO and ESRD. He had angioplasty of the right popliteal artery per Dr. Trula Slade on 11/11, but returned to the ED 11/22 with worsening right foot pain and was found to have significant plaque with reduced flows throughout the RLE. 11/25 Pt was unable to stand to attend dialysis secondary to severe right foot pain and was admitted for management.  S/p R fem-pop [facility] 11/27    PT Comments    Pt progressing towards physical therapy goals. Was able to ambulate farther this session, however pt was very fatigued by end of session. Required 3 standing rest breaks due to pain and fatigue. Will continue to follow and progress as able per POC.   Follow Up Recommendations  Home health PT     Equipment Recommendations  3in1 (PT);Rolling walker with 5" wheels    Recommendations for Other Services       Precautions / Restrictions Precautions Precautions: Fall Required Braces or Orthoses: Other Brace/Splint Other Brace/Splint: DARCO shoe for R foot Restrictions Weight Bearing Restrictions: No    Mobility  Bed Mobility Overal bed mobility: Needs Assistance Bed Mobility: Supine to Sit     Supine to sit: Supervision;HOB elevated     General bed mobility comments: No physical assist required.   Transfers Overall transfer level: Needs assistance Equipment used: Rolling walker (2 wheeled) Transfers: Sit to/from Stand Sit to Stand: Min guard         General transfer comment: Pt was able to power-up to full standing with no physical assistance.   Ambulation/Gait Ambulation/Gait assistance: Supervision Ambulation Distance (Feet): 90 Feet Assistive device: Rolling walker (2 wheeled) Gait Pattern/deviations:  Step-through pattern;Decreased stride length;Trunk flexed;Narrow base of support Gait velocity: Decreased Gait velocity interpretation: Below normal speed for age/gender General Gait Details: Very slow and guarded. Pt demonstrates step-through gait pattern however continues to show decreased step/stride length. 3 standing rest breaks due to fatigue and pain.    Stairs            Wheelchair Mobility    Modified Rankin (Stroke Patients Only)       Balance Overall balance assessment: Needs assistance Sitting-balance support: Feet supported;No upper extremity supported Sitting balance-Leahy Scale: Normal     Standing balance support: During functional activity;Bilateral upper extremity supported Standing balance-Leahy Scale: Fair                      Cognition Arousal/Alertness: Awake/alert Behavior During Therapy: WFL for tasks assessed/performed Overall Cognitive Status: Within Functional Limits for tasks assessed                      Exercises      General Comments        Pertinent Vitals/Pain Pain Assessment: Faces Faces Pain Scale: Hurts a little bit Pain Location: Feet bilaterally R>L Pain Intervention(s): Limited activity within patient's tolerance;Monitored during session    Home Living                      Prior Function            PT Goals (current goals can now be found in the care plan section) Acute Rehab PT Goals Patient Stated Goal:  Walk further PT Goal Formulation: With patient Time For Goal Achievement: 09/13/14 Potential to Achieve Goals: Good Progress towards PT goals: Progressing toward goals    Frequency  Min 3X/week    PT Plan Current plan remains appropriate    Co-evaluation             End of Session Equipment Utilized During Treatment: Gait belt Activity Tolerance: Patient limited by pain;Patient limited by fatigue Patient left: in chair;with call bell/phone within reach     Time:  2263-3354 PT Time Calculation (min) (ACUTE ONLY): 36 min  Charges:  $Gait Training: 8-22 mins $Therapeutic Activity: 8-22 mins                    G Codes:      Rolinda Roan September 09, 2014, 5:07 PM   Rolinda Roan, PT, DPT Acute Rehabilitation Services Pager: [phone]

## 2014-09-04 NOTE — Procedures (Signed)
I was present at this dialysis session, have reviewed the session itself and made  appropriate changes  [name] MD (pgr) [phone]    [phone] 09/04/2014, 9:47 AM

## 2014-09-04 NOTE — Progress Notes (Signed)
PATIENT DETAILS Name: [name] Age: 53 y.o. Sex: male Date of Birth: Sep 19, 1961 Admit Date: 08/16/2014 Admitting Physician Serafina Mitchell, MD JKD:[id number],KDXIP L, MD  Brief narrative: 53 year old male patient with known vascular disease, chronic kidney disease on dialysis, hypertension, chronic steroids for gout brought to the emergency room on 08/16/14 with severe right leg pain, evaluated by vascular surgery, and underwent right femoral to below the knee popliteal artery bypass graft on 11/27. Subsequently patient developed significant left leg swelling, with concerns for left venous stenosis related to his left thigh arteriovenous graft, on 12/3 patient underwentleft thigh shuntogram with subsequent venoplasty and stenting of the left common femoral vein.Previous left foot swelling improved remarkably after this procedure. On 12/5 patient developed significant hematochezia, unfortunately developed hemorrhagic shock and required ICU transfer. He received a total of 11 units of PRBC, 4 units of FFP and temporarily required pressors to maintain blood pressure. Aspirin/Plavix were placed on hold. He slowly stabilized, hematochezia completely resolved, on 12/11 he started having brown bowel movements. He was briefly monitored in the stepdown unit, and transferred to my care on 09/03/14  Subjective: Without major complaints no further hematochezia or melena  Assesment/Plan: Active Problems: Hemorrhagic shock: Secondary to lower GI bleed.Shock has resolved - pt is hemodynamically stable   Lower GI bleed: presumed to be related to ischemic colitis. GI consulted and was following. Plans were to pursue colonoscopy, however patient refused to comply with the preparation. Bleeding has completely resolved, patient passing brown stools. No further recommendations from gastroenterology, spoke with Dr. Henrene Pastor on 12/13 okay to resume aspirin/Plavix.   Acute blood loss anemia: Secondary to  above. Hemoglobin stable. Has received a total of 11 units of PRBC per prior notes. Follow hemoglobin.  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 No further left leg swelling.  ESRD on dialysis:Nephrology following - HD on Tuesdays Thursdays and Saturdays  History of CAD with prior drug-eluting stent April 2015:no acute cardiac issue presently - aspirin and Plavix held due to recurrent GI bleeding,since GI bleeding has resolved, spoke with Dr Henrene Pastor on 12/13-advised to resume. Continue Lipitor  HTN: Controlled, continue with amlodipine, Coreg  History of gout: continue chronic prednisone.Continue Colchicine  Hypoglycemia:encourage oral intake-suspect this is secondary to acute illness/poor oral itnake. Monitor CBGs.  Severe protein calorie malnutrition:c/w supplements  Disposition: Remain inpatient-SNF vs HHPT on 09/04/14  Antibiotics:  See below   Anti-infectives    Start     Dose/Rate Route Frequency Ordered Stop   08/18/14 1800  cefUROXime (ZINACEF) 1.5 g in dextrose 5 % 50 mL IVPB  Status:  Discontinued     1.5 g100 mL/hr over 30 Minutes Intravenous Every 12 hours 08/18/14 1631 08/18/14 1643   08/18/14 1800  cefUROXime (ZINACEF) 1.5 g in dextrose 5 % 50 mL IVPB     1.5 g100 mL/hr over 30 Minutes Intravenous Every 24 hours 08/18/14 1643 08/18/14 1804      DVT Prophylaxis:  SCD's  Code Status: Full code  Family Communication None at bedside  Procedures: 11/27 right femoral to below-knee popliteal artery bypass graft 12/3 left thigh shuntogram of AV shunt with venoplasty and stent of left common femoral vein  CONSULTS: Cardiology-signed  off Nephrology PCCM - signed  off Gastroenterology-signed off Vasc Surgery  MEDICATIONS: Scheduled Meds: . amLODipine  10 mg Oral Daily  . aspirin EC  81 mg Oral Daily  . atorvastatin  40 mg Oral q1800  . calcitRIOL  2.75 mcg Oral Q T,Th,Sa-HD  . calcium acetate  2,001 mg Oral TID WC  . carvedilol  6.25 mg Oral BID WC  . cinacalcet  60 mg Oral Daily  . clopidogrel  75 mg Oral Q breakfast  . colchicine  0.3 mg Oral Once per day on Mon Thu  . darbepoetin (ARANESP) injection - DIALYSIS  60 mcg Intravenous Q Sat-HD  . docusate sodium  100 mg Oral BID  . metoCLOPramide (REGLAN) injection  10 mg Intravenous TID  . multivitamin  1 tablet Oral QHS  . pantoprazole  40 mg Oral Daily  . polyethylene glycol  17 g Oral Daily  . predniSONE  5 mg Oral Daily  . simethicone  40 mg Oral QID   Continuous Infusions:   PRN Meds:.sodium chloride, sodium chloride, acetaminophen **OR** acetaminophen, alteplase, feeding supplement (NEPRO CARB STEADY), guaiFENesin-dextromethorphan, heparin, heparin, hydrALAZINE, lidocaine (PF), lidocaine-prilocaine, morphine injection, ondansetron, oxyCODONE, pentafluoroprop-tetrafluoroeth, phenol, senna-docusate, sodium chloride    PHYSICAL EXAM: Vital signs in last 24 hours: Filed Vitals:   09/04/14 1045 09/04/14 1115 09/04/14 1125 09/04/14 1158  BP: 124/63 139/73 132/59 118/40  Pulse: 95 91 83 86  Temp:   97 F (36.1 C) 97.8 F (36.6 C)  TempSrc:   Oral Oral  Resp:   16   Height:      Weight:   47.1 kg (103 lb 13.4 oz)   SpO2:   97% 98%    Weight change:  Filed Weights   09/02/14 2052 09/04/14 0740 09/04/14 1125  Weight: 50.803 kg (112 lb) 51.1 kg (112 lb 10.5 oz) 47.1 kg (103 lb 13.4 oz)   Body mass index is 14.49 kg/(m^2).   Gen Exam: Awake and alert with clear speech. Deconditioned. Neck: Supple, No JVD.   Chest: B/L Clear.  No rales or rhonchi CVS: S1 S2 Regular, no murmurs.  Abdomen: soft, BS +, non tender, non distended.  Extremities: no edema Neurologic: Non Focal.    Skin: No Rash.     Intake/Output from previous day:  Intake/Output Summary (Last 24 hours) at 09/04/14 1422 Last data filed at 09/04/14 1125  Gross per 24 hour  Intake      0 ml  Output   3502 ml  Net  -3502 ml     LAB RESULTS: CBC  Recent Labs Lab 08/31/14 0500 09/01/14 0336 09/01/14 1025 09/02/14 0230 09/04/14 0500  WBC 14.9* 10.1 10.8* 12.0* 13.7*  HGB 7.7* 11.0* 11.3* 10.7* 10.3*  HCT 22.8* 33.4* 34.2* 32.6* 32.5*  PLT 104* 93* 93* 91* 139*  MCV 86.7 87.7 88.1 87.2 88.6  MCH 29.3 28.9 29.1 28.6 28.1  MCHC 33.8 32.9 33.0 32.8 31.7  RDW 17.1* 16.3* 16.6* 17.0* 16.8*  LYMPHSABS  --   --  1.1  --   --   MONOABS  --   --  1.0  --   --   EOSABS  --   --  0.1  --   --   BASOSABS  --   --  0.0  --   --     Chemistries   Recent Labs Lab 08/30/14 0440 08/31/14 0500 09/01/14 0336 09/02/14 0230 09/04/14 0830  NA 139 136* 140 139 137  K 4.4 4.8 3.6* 3.8  4.7  CL 100 99 101 100 99  CO2 26 25 28 27 23   GLUCOSE 124* 85 63* 71 99  BUN 19 34* 16 23 36*  CREATININE 4.08* 5.86* 3.10* 4.76* 7.55*  CALCIUM 8.0* 8.0* 7.7* 7.5* 7.3*  MG 1.8  --   --   --   --     CBG:  Recent Labs Lab 09/03/14 1205 09/03/14 1720 09/03/14 2352 09/04/14 0559 09/04/14 1155  GLUCAP 123* 88 111* 101* 87    GFR Estimated Creatinine Clearance: 7.4 mL/min (by C-G formula based on Cr of 7.55).  Coagulation profile  Recent Labs Lab 08/29/14 0520  INR 1.19    Cardiac Enzymes  Recent Labs Lab 08/29/14 0520  TROPONINI <0.30    Invalid input(s): POCBNP No results for input(s): DDIMER in the last 72 hours. No results for input(s): HGBA1C in the last 72 hours. No results for input(s): CHOL, HDL, LDLCALC, TRIG, CHOLHDL, LDLDIRECT in the last 72 hours. No results for input(s): TSH, T4TOTAL, T3FREE, THYROIDAB in the last 72 hours.  Invalid input(s): FREET3 No results for input(s): VITAMINB12, FOLATE, FERRITIN, TIBC, IRON, RETICCTPCT in the last 72 hours. No results for  input(s): LIPASE, AMYLASE in the last 72 hours.  Urine Studies No results for input(s): UHGB, CRYS in the last 72 hours.  Invalid input(s): UACOL, UAPR, USPG, UPH, UTP, UGL, UKET, UBIL, UNIT, UROB, ULEU, UEPI, UWBC, URBC, UBAC, CAST, UCOM, BILUA  MICROBIOLOGY: Recent Results (from the past 240 hour(s))  Surgical pcr screen     Status: None   Collection Time: 08/26/14  5:20 PM  Result Value Ref Range Status   MRSA, PCR NEGATIVE NEGATIVE Final   Staphylococcus aureus NEGATIVE NEGATIVE Final    Comment:        The Xpert SA Assay (FDA approved for NASAL specimens in patients over 73 years of age), is one component of a comprehensive surveillance program.  Test performance has been validated by [employer] for patients greater than or equal to 37 year old. It is not intended to diagnose infection nor to guide or monitor treatment.     RADIOLOGY STUDIES/RESULTS: Ct Abdomen Pelvis W Contrast  08/27/2014   CLINICAL DATA:  Acute onset of bright red blood per rectum, with periumbilical cramping abdominal pain. Initial encounter.  EXAM: CT ABDOMEN AND PELVIS WITH CONTRAST  TECHNIQUE: Multidetector CT imaging of the abdomen and pelvis was performed using the standard protocol following bolus administration of intravenous contrast.  CONTRAST:  123mL OMNIPAQUE IOHEXOL 300 MG/ML  SOLN  COMPARISON:  CT of the abdomen and pelvis from 11/10/2012  FINDINGS: Trace bilateral pleural effusions are seen, with bibasilar atelectasis.  A nonspecific 6 mm hypodensity within the left hepatic lobe is of uncertain significance. The liver is otherwise unremarkable. The spleen is mildly bulky, but remains normal in length. There is persistent chronic gallbladder wall thickening. The gallbladder is difficult to fully assess given trace ascites tracking about the liver.  The pancreas and adrenal glands are unremarkable.  The patient's native kidneys are markedly atrophic, with scattered bilateral renal cysts of  varying size and attenuation. Scattered associated calcifications are seen. There is no evidence of hydronephrosis. No obstructing renal stones are seen.  There is marked fatty infiltration with regard to the transplant kidney at the left iliac fossa, with marked thinning of the renal parenchyma. Diffuse associated vascular calcifications are seen. This appearance is stable from 2014.  There is mild diffuse mesenteric and omental edema, nonspecific in appearance.  The  stomach is within normal limits. No acute vascular abnormalities are seen. Diffuse calcification is seen along the abdominal aorta and its branches. Extensive diffuse vascular calcification is noted throughout the abdomen and pelvis.  The patient appears to be status post resection of much of the bowel. Remaining small bowel is grossly unremarkable. A distended anastomosis is noted at the right hemipelvis; this corresponds to the site of the prior right pelvic catheter, which has been removed.  The appendix is not definitely characterized; there is no evidence for appendicitis. Contrast progresses to the level of the proximal sigmoid colon. There is scattered diverticulosis along the transverse colon. Stool is noted filling the distal sigmoid colon and rectum.  There is soft tissue inflammation about the distal sigmoid colon and rectum, raising question for proctitis. Diffuse presacral stranding is also noted, new from the prior study.  The bladder is not well assessed. Soft tissue density about the expected location of the bladder is relatively stable in appearance. 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 This is perhaps slightly more prominent than on the prior study. No inguinal lymphadenopathy is seen.  No acute osseous abnormalities are identified.  IMPRESSION: 1. Soft tissue inflammation about the distal sigmoid colon and rectum, with diffuse presacral  stranding, new from the prior study. This raises concern for proctitis, which may explain the patient's bright red blood per rectum. Stool noted filling the distal sigmoid colon and rectum. 2. Scattered diverticulosis along the transverse colon, without evidence of diverticulitis; the colon is otherwise grossly unremarkable. 3. Trace bilateral pleural effusions, with bibasilar atelectasis. 4. Marked atrophy of the patient's bilateral native kidneys, with bilateral renal cysts and scattered calcifications. Marked fatty infiltration of the transplant kidney at the left iliac fossa, with marked thinning of the renal parenchyma. This appearance is stable from 2014. 5. Stable appearance to soft tissue density about the expected location of the bladder. 6. 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 7. Diffuse calcification along the abdominal aorta and its branches. Extensive diffuse vascular calcification seen throughout the abdomen and pelvis. 8. Nonspecific 6 mm hypodensity within the left hepatic lobe, new from prior studies. 9. Persistent chronic gallbladder wall thickening; gallbladder difficult to fully assess given trace ascites about the liver, but this appearance is relatively stable. 10. Mild nonspecific diffuse mesenteric and omental edema.   Electronically Signed   By: Garald Balding M.D.   On: 08/27/2014 01:30   Dg Chest Port 1 View  08/29/2014   CLINICAL DATA:  Central line placement.  EXAM: PORTABLE CHEST - 1 VIEW  COMPARISON:  07/03/2012  FINDINGS: Interval placement of a left central venous catheter. Tip projects to the left mediastinum over the aortic arch. This could be in the brachiocephalic vein, in a normal variant left superior vena cava, or an arterial branch. No pneumothorax. Calcification in the left apex is likely postinflammatory. This is new since previous study.  Heart size is normal. Hemidiaphragms are not included within the field of view.  IMPRESSION: Nonspecific placement of left central venous catheter to the left of midline, suggesting placement either in the brachiocephalic vein, left superior vena cava, or arterial branch. No pneumothorax.   Electronically Signed   By: Lucienne Capers M.D.   On: 08/29/2014 02:17   Dg Abd Portable 1v  08/26/2014   CLINICAL DATA:  Acute GI bleed  EXAM: PORTABLE ABDOMEN - 1 VIEW  COMPARISON:  CT abdomen pelvis dated 11/10/2012  FINDINGS: Nonspecific but nonobstructive bowel gas pattern.  Surgical clips overlying the pelvis.  Vascular calcifications.  IMPRESSION: Unremarkable abdominal radiograph.   Electronically Signed   By: Julian Hy M.D.   On: 08/26/2014 22:30    Oren Binet, MD  [facility] Pager:[phone]  If 7PM-7AM, please contact night-coverage [email or web address] Password [email or web address] 09/04/2014, 2:22 PM   LOS: 19 days

## 2014-09-04 NOTE — Progress Notes (Signed)
Occupational Therapy Re-Evaluation Patient Details Name: [name] MRN: [id number] DOB: 16-Jul-1961 Today's Date: 09/04/2014    History of Present Illness  53 year old male patient underwent right femoral to below the knee popliteal artery bypass graft on 11/27. On 12/3 patient underwentleft thigh shuntogram with subsequent venoplasty and stenting of the left common femoral vein.  On 12/5 patient developed significant hematochezia, unfortunately developed hemorrhagic shock and required ICU transfer. Transferred to floor 12/13.   Clinical Impression   PTA, pt was mod I with mobility and ADL. Pt with generalized weakness. Pt does not want to D/C to SNF. Feel pt will be able to D/C home with initial 24/7 S and HHOT/PT when medically stable. Will plan to see again in am to further assess safety and progress pt toward safe D/C home.     Follow Up Recommendations  Home health OT;Supervision/Assistance - 24 hour (initially)    Equipment Recommendations  3 in 1 bedside comode    Recommendations for Other Services       Precautions / Restrictions Precautions Precautions: Fall Required Braces or Orthoses: Other Brace/Splint Other Brace/Splint: DAR[employer] shoe for R foot - will assess Restrictions Weight Bearing Restrictions: No      Mobility Bed Mobility Overal bed mobility: Needs Assistance Bed Mobility: Supine to Sit     Supine to sit: Supervision;HOB elevated     General bed mobility comments: No physical assist required.   Transfers Overall transfer level: Needs assistance Equipment used: None Transfers: Sit to/from [facility] Sit to Stand: Supervision Stand pivot transfers: Supervision       General transfer comment: Pt completed slowly with S    Balance Overall balance assessment: Needs assistance Sitting-balance support: Feet supported;No upper extremity supported Sitting balance-Leahy Scale: Normal     Standing balance support: During  functional activity Standing balance-Leahy Scale: Fair                              ADL Overall ADL's : Needs assistance/impaired             Lower Body Bathing: Set up;Supervison/ safety;Sit to/from stand       Lower Body Dressing: Supervision/safety;Set up;Sit to/from stand   Toilet Transfer: Min guard;Ambulation   Toileting- Clothing Manipulation and Hygiene: Modified independent;Sit to/from stand       Functional mobility during ADLs: Supervision/safety General ADL Comments: discussed home set up and availability of caregivers. Pt states his sister will assist as needed with IADL tasks.      Vision                     Perception     Praxis      Pertinent Vitals/Pain Pain Assessment: 0-10 Pain Score: 4  Faces Pain Scale: Hurts a little bit Pain Location: R foot/lower leg Pain Descriptors / Indicators: Aching Pain Intervention(s): Limited activity within patient's tolerance     Hand Dominance Right   Extremity/Trunk Assessment Upper Extremity Assessment Upper Extremity Assessment: Generalized weakness   Lower Extremity Assessment Lower Extremity Assessment: Defer to PT evaluation - c/o B foot numbness   Cervical / Trunk Assessment Cervical / Trunk Assessment: Normal   Communication Communication Communication: No difficulties   Cognition Arousal/Alertness: Awake/alert Behavior During Therapy: WFL for tasks assessed/performed Overall Cognitive Status: Within Functional Limits for tasks assessed  General Comments       Exercises Exercises: Other exercises Other Exercises Other Exercises: general BUE AROM/strengthening ex   Shoulder Instructions      Home Living Family/patient expects to be discharged to:: Private residence Living Arrangements: Other (Comment) Available Help at Discharge: Family;Other (Comment) Type of Home: House Home Access: Stairs to enter [employer] of Steps:  2 Entrance Stairs-Rails: Can reach both Home Layout: One level     Bathroom Shower/Tub: Tub/shower unit Shower/tub characteristics: Architectural technologist: Standard Bathroom Accessibility: Yes How Accessible: Accessible via walker Home Equipment: Walker - 2 wheels   Additional Comments: has recently had to use RW to get to HD      Prior Functioning/Environment Level of Independence: Independent with assistive device(s)             OT Diagnosis: Generalized weakness;Acute pain   OT Problem List: Decreased strength;Decreased activity tolerance;Decreased knowledge of use of DME or AE;Pain   OT Treatment/Interventions: Self-care/ADL training;Therapeutic exercise;DME and/or AE instruction;Therapeutic activities;Patient/family education    OT Goals(Current goals can be found in the care plan section) Acute Rehab OT Goals Patient Stated Goal: to be able to take care of myself OT Goal Formulation: With patient Time For Goal Achievement: 09/18/14 Potential to Achieve Goals: Good  OT Frequency: Min 2X/week   Barriers to D/C:            Co-evaluation              End of Session Nurse Communication: Mobility status  Activity Tolerance: Patient tolerated treatment well Patient left: in chair;with call bell/phone within reach   Time: 9794-8016 OT Time Calculation (min): 20 min Charges:  OT General Charges $OT Visit: 1 Procedure OT Evaluation $OT Re-eval: 1 Procedure OT Treatments $Self Care/Home Management : 8-22 mins G-Codes:    Telly Broberg,HILLARY 09-10-14, 5:30 PM   [facility], OTR/L  [phone] 09-10-2014

## 2014-09-04 NOTE — Progress Notes (Signed)
   [facility]  Subjective  - No new complaints today, stomach feels tight , but no more bleeding.   Objective 122/73 93 97.7 F (36.5 C) (Oral) 18 96% No intake or output data in the 24 hours ending 09/04/14 1015  Feet warm right toes 1-3 are further demarcating dry gangrene.   Assessment/Planning  POD # s/p: Right femoral to below knee popliteal artery bypass graft with 6 mm external ring propatent PTFE 17 Days Post-Op  Left thigh shuntogram with venoplasty and stenting of left common femoral vain.  11 Days Post-Op  Stable from a vascular point of view.  Chronic right foot dry gangrene.   Currently on dialysis.  Laurence Slate [facility] 09/04/2014 10:15 AM --  Laboratory Lab Results:  Recent Labs  09/02/14 0230 09/04/14 0500  WBC 12.0* 13.7*  HGB 10.7* 10.3*  HCT 32.6* 32.5*  PLT 91* 139*   BMET  Recent Labs  09/02/14 0230 09/04/14 0830  NA 139 137  K 3.8 4.7  CL 100 99  CO2 27 23  GLUCOSE 71 99  BUN 23 36*  CREATININE 4.76* 7.55*  CALCIUM 7.5* 7.3*    COAG Lab Results  Component Value Date   INR 1.19 08/29/2014   INR 1.23 08/27/2014   INR 1.68* 08/26/2014   No results found for: PTT

## 2014-09-04 NOTE — Progress Notes (Signed)
Subjective:  Seen on dialysis, mild pain in right foot, last dialysis on 12/10 secondary to access problems on 12/12  Objective: Vital signs in last 24 hours: Temp:  [97.7 F (36.5 C)-98.6 F (37 C)] 97.7 F (36.5 C) (12/14 0740) Pulse Rate:  [72-79] 79 (12/14 0815) Resp:  [15-18] 18 (12/14 0740) BP: (105-155)/(50-80) 155/80 mmHg (12/14 0815) SpO2:  [94 %-97 %] 96 % (12/14 0740) Weight:  [51.1 kg (112 lb 10.5 oz)] 51.1 kg (112 lb 10.5 oz) (12/14 0740) Weight change:   Intake/Output from previous day:   Intake/Output this shift:   Lab Results:  Recent Labs  09/02/14 0230 09/04/14 0500  WBC 12.0* 13.7*  HGB 10.7* 10.3*  HCT 32.6* 32.5*  PLT 91* 139*   BMET:  Recent Labs  09/02/14 0230  NA 139  K 3.8  CL 100  CO2 27  GLUCOSE 71  BUN 23  CREATININE 4.76*  CALCIUM 7.5*   No results for input(s): PTH in the last 72 hours. Iron Studies: No results for input(s): IRON, TIBC, TRANSFERRIN, FERRITIN in the last 72 hours.  Studies/Results: No results found.   EXAM: General appearance:  Alert, in no apparent distress Resp:  CTA with rales, rhonchi, or wheezes Cardio:  RRR without murmur or rub GI:  + BS, soft and nontender Extremities:  Clean dressing at R thigh, no edema, dry gangrene 1st-3rd toes Access:  L thigh AVG with BFR 400  HD: TTS [facility] 3.5h 46.5 kg 2K/2Ca 400/800 Profile 2 Heparin 4000 U L thigh AVG  Calcitriol 2.75 mcg Aranesp 25 mcg on Thurs Venofer 100 mg through 12/3, then qwk  Assessment/Plan: 1. Acute GI bleed - refused colonoscopy, unable to tolerate prep, no recent noticeable blood in stool, no Heparin with HD. 2. Ischemic R foot - s/p R fem-pop bypass 11/27 per Dr. Trula Slade.   3. ESRD - HD on TTS, delayed until today sec to difficulty with arterial needle on 12/12, K 3.8.  Next HD tomorrow on schedule. 4. HTN/Volume - BP 147/77 on Amlodipine 10 mg qd, Carvedilol 6.25 mg bid; wt 51.1 kg, UF goal of 43.5 L today. 5. Anemia  - Hgb 10.3, s/p multiple transfusions (most recent 12/10), Aranesp 60 mcg on Sat.  6. Sec HPT - Ca 7.5 (9.8 corrected); Calcitriol 2.75 mcg, Sensipar 60 mg qd, Phoslo 3 with meals. 7. Nutrition - Alb 1.8, renal diet, vitamin. 8. Hx CAD - s/p cath w/ PCI 12/2013. 9. Hepatitis C 10. Renal transplant - 1987, failed in 2004.   LOS: 19 days   [name],CHARLES 09/04/2014,8:31 AM  Pt seen, examined and agree w A/P as above.  [name] MD pager [phone]    cell [phone] 09/04/2014, 9:48 AM

## 2014-09-05 LAB — RENAL FUNCTION PANEL
Albumin: 1.8 g/dL — ABNORMAL LOW (ref 3.5–5.2)
Anion gap: 13 (ref 5–15)
BUN: 18 mg/dL (ref 6–23)
CO2: 27 meq/L (ref 19–32)
Calcium: 7.3 mg/dL — ABNORMAL LOW (ref 8.4–10.5)
Chloride: 100 mEq/L (ref 96–112)
Creatinine, Ser: 5.35 mg/dL — ABNORMAL HIGH (ref 0.50–1.35)
GFR, EST AFRICAN AMERICAN: 13 mL/min — AB (ref >90)
GFR, EST NON AFRICAN AMERICAN: 11 mL/min — AB (ref >90)
GLUCOSE: 78 mg/dL (ref 70–99)
PHOSPHORUS: 2.8 mg/dL (ref 2.3–4.6)
Potassium: 4 mEq/L (ref 3.7–5.3)
SODIUM: 140 meq/L (ref 137–147)

## 2014-09-05 LAB — GLUCOSE, CAPILLARY
GLUCOSE-CAPILLARY: 117 mg/dL — AB (ref 70–99)
Glucose-Capillary: 78 mg/dL (ref 70–99)
Glucose-Capillary: 75 mg/dL (ref 70–99)

## 2014-09-05 LAB — CBC
HEMATOCRIT: 33 % — AB (ref 39.0–52.0)
Hemoglobin: 10.3 g/dL — ABNORMAL LOW (ref 13.0–17.0)
MCH: 27.8 pg (ref 26.0–34.0)
MCHC: 31.2 g/dL (ref 30.0–36.0)
MCV: 88.9 fL (ref 78.0–100.0)
PLATELETS: 158 10*3/uL (ref 150–400)
RBC: 3.71 MIL/uL — AB (ref 4.22–5.81)
RDW: 16.5 % — ABNORMAL HIGH (ref 11.5–15.5)
WBC: 11.4 10*3/uL — AB (ref 4.0–10.5)

## 2014-09-05 MED ORDER — CALCIUM ACETATE (PHOS BINDER) 667 MG PO TABS: 2001.0000 mg | ORAL_TABLET | Freq: Three times a day (TID) | ORAL | Status: AC

## 2014-09-05 MED ORDER — CINACALCET HCL 60 MG PO TABS: 60.0000 mg | ORAL_TABLET | Freq: Every day | ORAL | Status: AC

## 2014-09-05 MED ORDER — CLOPIDOGREL BISULFATE 75 MG PO TABS: 75.0000 mg | ORAL_TABLET | Freq: Every day | ORAL | Status: DC

## 2014-09-05 MED ORDER — SENNOSIDES-DOCUSATE SODIUM 8.6-50 MG PO TABS: 1.0000 | ORAL_TABLET | Freq: Two times a day (BID) | ORAL | Status: AC | PRN

## 2014-09-05 MED ORDER — NEPRO/CARBSTEADY PO LIQD: 237.0000 mL | ORAL | Status: AC | PRN

## 2014-09-05 MED ORDER — ATORVASTATIN CALCIUM 40 MG PO TABS: ORAL_TABLET | ORAL | Status: AC

## 2014-09-05 MED ORDER — COLCHICINE 0.6 MG PO TABS: 0.3000 mg | ORAL_TABLET | ORAL | Status: AC

## 2014-09-05 MED ORDER — SIMETHICONE 40 MG/0.6ML PO SUSP: 40.0000 mg | Freq: Four times a day (QID) | ORAL | Status: AC | PRN

## 2014-09-05 MED ORDER — RENA-VITE PO TABS: 1.0000 | ORAL_TABLET | Freq: Every day | ORAL | Status: AC

## 2014-09-05 MED ORDER — CARVEDILOL 6.25 MG PO TABS: 6.2500 mg | ORAL_TABLET | Freq: Two times a day (BID) | ORAL | Status: AC

## 2014-09-05 MED ORDER — OXYCODONE HCL 5 MG PO TABS: 5.0000 mg | ORAL_TABLET | Freq: Four times a day (QID) | ORAL | Status: DC | PRN

## 2014-09-05 MED ORDER — PANTOPRAZOLE SODIUM 40 MG PO TBEC: 40.0000 mg | DELAYED_RELEASE_TABLET | Freq: Every day | ORAL | Status: AC

## 2014-09-05 MED ORDER — PREDNISONE 5 MG PO TABS: 5.0000 mg | ORAL_TABLET | Freq: Every day | ORAL | Status: AC

## 2014-09-05 MED ORDER — METOCLOPRAMIDE HCL 10 MG PO TABS: 10.0000 mg | ORAL_TABLET | Freq: Three times a day (TID) | ORAL | Status: AC | PRN

## 2014-09-05 MED ORDER — AMLODIPINE BESYLATE 5 MG PO TABS: 10.0000 mg | ORAL_TABLET | Freq: Every day | ORAL | Status: AC

## 2014-09-05 MED ORDER — POLYETHYLENE GLYCOL 3350 17 G PO PACK: 17.0000 g | PACK | Freq: Every day | ORAL | Status: AC

## 2014-09-05 NOTE — Discharge Summary (Signed)
PATIENT DETAILS Name: [name] Age: 53 y.o. Sex: male Date of Birth: 07-Dec-1960 MRN: [id number]. Admitting Physician: Serafina Mitchell, MD BBC:[id number],HWTUU L, MD  Admit Date: 08/16/2014 Discharge date: 09/05/2014  Recommendations for Outpatient Follow-up:  1. Please check CBC in 1 week  PRIMARY DISCHARGE DIAGNOSIS:  Active Problems:   Peripheral vascular disease   CAD (coronary artery disease), native coronary artery   Essential hypertension   Acute GI hemorrhage   Abdominal pain, left lower quadrant   ESRD on dialysis   Hemorrhagic shock   Acute GI bleeding   Ischemic colitis   Hematochezia      PAST MEDICAL HISTORY: Past Medical History  Diagnosis Date  . ESRD on hemodialysis     a. ESRD 2/2 HTN with renal transplant in 1987 (cadaveric) after short period of dialysis;  b. Transplant failed in 2004 and he went back on HD;  c. As of 10/15 getting HD via L thigh AVG on a TTS schedule at [facility] on [facility].  . Hypertension   . Hx of kidney transplant     a. 1987-> back on HD since 2004  . Gout tophi   . Chronic steroid use     a. Has severe gout. Did not tolerate Allopurinol. Do not taper per PCP   . Systolic CHF, chronic     2 D echo 04/2012 with EF of 45 %   . Malnutrition   . Hx SBO 04/2012    a. 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 F/U with Dr Donne Hazel. Started on TNA  . Anemia associated with chronic renal failure   . Hepatitis C   . GERD (gastroesophageal reflux disease)   . H/O hiatal hernia   . Arthritis   . History of DVT (deep vein thrombosis)   . Peripheral vascular disease     a. 12/2013 PTA of L Pop;  b. 12/2013 PTA R pop, R DP;  c. 06/2014 L Pop CBA/DCB PTA.  . Prostate cancer   . CAD (coronary artery disease)     a. 12/2013 Cath/PCI: EF 45-50%, LM Ca2+, LAD 30-40p, 27m, D1/2/3 min irregs, LCX 50ost, 30-14m, RCA 70p, 95/41m (Rota->3.0x23 Xience distal, 3.0x23  Xience mid, 3.0x28 Xience prox).  . Chronic diastolic CHF (congestive heart failure)     a. 12/2013 Echo: EF 55-60%, mild LVH, nl wall motion, Gr 2 DD.  . Dry gangrene     a. L great toe    DISCHARGE MEDICATIONS: Current Discharge Medication List    START taking these medications   Details  metoCLOPramide (REGLAN) 10 MG tablet Take 1 tablet (10 mg total) by mouth every 8 (eight) hours as needed for nausea or vomiting. Qty: 30 tablet, Refills: 0    Nutritional Supplements (FEEDING SUPPLEMENT, NEPRO CARB STEADY,) LIQD Take 237 mLs by mouth as needed (missed meal during dialysis.). Qty: 30 Can, Refills: 0    polyethylene glycol (MIRALAX / GLYCOLAX) packet Take 17 g by mouth daily. Qty: 30 each, Refills: 0    senna-docusate (SENOKOT-S) 8.6-50 MG per tablet Take 1 tablet by mouth 2 (two) times daily as needed for mild constipation. Qty: 30 tablet, Refills: 0    simethicone (MYLICON) 40 EK/8.0KL drops Take 0.6 mLs (40 mg total) by mouth 4 (four) times daily as needed for flatulence. Qty: 30 mL, Refills: 0      CONTINUE these medications which have CHANGED   Details  amLODipine (NORVASC) 5 MG tablet Take 2 tablets (10 mg total) by mouth daily. Qty: 30 tablet, Refills: 0    atorvastatin (LIPITOR) 40 MG tablet TAKE 1 TABLET BY MOUTH DAILY AT 6 PM Qty: 30 tablet, Refills: 0    calcium acetate (PHOSLO) 667 MG tablet Take 3 tablets by mouth 3 (three) times daily. Qty: 270 tablet, Refills: 0    carvedilol (COREG) 6.25 MG tablet Take 1 tablet (6.25 mg total) by mouth 2 (two) times daily with a meal. Qty: 60 tablet, Refills: 9    cinacalcet (SENSIPAR) 60 MG tablet Take 1 tablet (60 mg total) by mouth daily. Qty: 30 tablet, Refills: 0    clopidogrel (PLAVIX) 75 MG tablet Take 1 tablet (75 mg total) by mouth daily with breakfast. Qty: 30 tablet, Refills: 0    colchicine (COLCRYS) 0.6 MG tablet Take 0.5 tablets (0.3 mg total) by mouth 2 (two) times a week. On Monday and Thursday Qty: 60  tablet, Refills: 0    multivitamin (RENA-VIT) TABS tablet Take 1 tablet by mouth daily. Qty: 30 tablet, Refills: 0    oxyCODONE (ROXICODONE) 5 MG immediate release tablet Take 1-2 tablets (5-10 mg total) by mouth every 6 (six) hours as needed for severe pain. Qty: 60 tablet, Refills: 0    pantoprazole (PROTONIX) 40 MG tablet Take 1 tablet (40 mg total) by mouth daily. Qty: 30 tablet, Refills: 0    predniSONE (DELTASONE) 5 MG tablet Take 1 tablet (5 mg total) by mouth daily. Qty: 30 tablet, Refills: 0      CONTINUE these medications which have NOT CHANGED   Details  aspirin EC 81 MG tablet Take 81 mg by mouth daily.      STOP taking these medications     HYDROmorphone (DILAUDID) 2 MG tablet      LIDOCAINE EX      loperamide (IMODIUM A-D) 2 MG tablet      nitroGLYCERIN (NITRODUR - DOSED IN MG/24 HR) 0.3 mg/hr patch         ALLERGIES:   Allergies  Allergen Reactions  . Allopurinol Other (See Comments)    REACTION: decreased platelets    BRIEF HPI:  See H&P, Labs, Consult and Test reports for all details in brief, patient is a 53 year old male with history of end-stage disease on hemodialysis, CAD status post PCI in April 2015, who was brought to the emergency room on 11/25 with a right ischemic leg. Unfortunately, hospital course has been prolonged, patient underwent right femoropopliteal popliteal bypass, following which patient developed severe hematochezia resulting in hemorrhagic shock. Patient has subsequently been stabilized, and is deemed stable for discharge.  CONSULTATIONS:   cardiology, pulmonary/intensive care, GI, nephrology and vascular surgery  PERTINENT RADIOLOGIC STUDIES: Ct Abdomen Pelvis W Contrast  08/27/2014   CLINICAL DATA:  Acute onset of bright red blood per rectum, with periumbilical cramping abdominal pain. Initial encounter.  EXAM: CT ABDOMEN AND PELVIS WITH CONTRAST  TECHNIQUE: Multidetector CT imaging of the abdomen and pelvis was performed  using the standard protocol following bolus administration of intravenous contrast.  CONTRAST:  169mL OMNIPAQUE IOHEXOL 300 MG/ML  SOLN  COMPARISON:  CT of the abdomen and pelvis from 11/10/2012  FINDINGS: Trace bilateral pleural effusions are seen, with bibasilar atelectasis.  A nonspecific 6 mm hypodensity within the left hepatic lobe is of uncertain significance. The liver is otherwise unremarkable. The spleen is mildly bulky, but remains normal in length. There is persistent chronic gallbladder wall thickening. The gallbladder is difficult to  fully assess given trace ascites tracking about the liver.  The pancreas and adrenal glands are unremarkable.  The patient's native kidneys are markedly atrophic, with scattered bilateral renal cysts of varying size and attenuation. Scattered associated calcifications are seen. There is no evidence of hydronephrosis. No obstructing renal stones are seen.  There is marked fatty infiltration with regard to the transplant kidney at the left iliac fossa, with marked thinning of the renal parenchyma. Diffuse associated vascular calcifications are seen. This appearance is stable from 2014.  There is mild diffuse mesenteric and omental edema, nonspecific in appearance.  The stomach is within normal limits. No acute vascular abnormalities are seen. Diffuse calcification is seen along the abdominal aorta and its branches. Extensive diffuse vascular calcification is noted throughout the abdomen and pelvis.  The patient appears to be status post resection of much of the bowel. Remaining small bowel is grossly unremarkable. A distended anastomosis is noted at the right hemipelvis; this corresponds to the site of the prior right pelvic catheter, which has been removed.  The appendix is not definitely characterized; there is no evidence for appendicitis. Contrast progresses to the level of the proximal sigmoid colon. There is scattered diverticulosis along the transverse colon. Stool is  noted filling the distal sigmoid colon and rectum.  There is soft tissue inflammation about the distal sigmoid colon and rectum, raising question for proctitis. Diffuse presacral stranding is also noted, new from the prior study.  The bladder is not well assessed. Soft tissue density about the expected location of the bladder is relatively stable in appearance. 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 This is perhaps slightly more prominent than on the prior study. No inguinal lymphadenopathy is seen.  No acute osseous abnormalities are identified.  IMPRESSION: 1. Soft tissue inflammation about the distal sigmoid colon and rectum, with diffuse presacral stranding, new from the prior study. This raises concern for proctitis, which may explain the patient's bright red blood per rectum. Stool noted filling the distal sigmoid colon and rectum. 2. Scattered diverticulosis along the transverse colon, without evidence of diverticulitis; the colon is otherwise grossly unremarkable. 3. Trace bilateral pleural effusions, with bibasilar atelectasis. 4. Marked atrophy of the patient's bilateral native kidneys, with bilateral renal cysts and scattered calcifications. Marked fatty infiltration of the transplant kidney at the left iliac fossa, with marked thinning of the renal parenchyma. This appearance is stable from 2014. 5. Stable appearance to soft tissue density about the expected location of the bladder. 6. 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 7. Diffuse calcification along the abdominal aorta and its branches. Extensive diffuse vascular calcification seen throughout the abdomen and pelvis. 8. Nonspecific 6 mm hypodensity within the left hepatic lobe, new from prior studies. 9. Persistent chronic  gallbladder wall thickening; gallbladder difficult to fully assess given trace ascites about the liver, but this appearance is relatively stable. 10. Mild nonspecific diffuse mesenteric and omental edema.   Electronically Signed   By: Garald Balding M.D.   On: 08/27/2014 01:30   Dg Chest Port 1 View  08/29/2014   CLINICAL DATA:  Central line placement.  EXAM: PORTABLE CHEST - 1 VIEW  COMPARISON:  07/03/2012  FINDINGS: Interval placement of a left central venous catheter. Tip projects to the left mediastinum over the aortic arch. This could be in  the brachiocephalic vein, in a normal variant left superior vena cava, or an arterial branch. No pneumothorax. Calcification in the left apex is likely postinflammatory. This is new since previous study. Heart size is normal. Hemidiaphragms are not included within the field of view.  IMPRESSION: Nonspecific placement of left central venous catheter to the left of midline, suggesting placement either in the brachiocephalic vein, left superior vena cava, or arterial branch. No pneumothorax.   Electronically Signed   By: Lucienne Capers M.D.   On: 08/29/2014 02:17   Dg Abd Portable 1v  08/26/2014   CLINICAL DATA:  Acute GI bleed  EXAM: PORTABLE ABDOMEN - 1 VIEW  COMPARISON:  CT abdomen pelvis dated 11/10/2012  FINDINGS: Nonspecific but nonobstructive bowel gas pattern.  Surgical clips overlying the pelvis.  Vascular calcifications.  IMPRESSION: Unremarkable abdominal radiograph.   Electronically Signed   By: Julian Hy M.D.   On: 08/26/2014 22:30     PERTINENT LAB RESULTS: CBC:  Recent Labs  09/04/14 0500 09/05/14 0700  WBC 13.7* 11.4*  HGB 10.3* 10.3*  HCT 32.5* 33.0*  PLT 139* 158   CMET CMP     Component Value Date/Time   NA 140 09/05/2014 0700   K 4.0 09/05/2014 0700   CL 100 09/05/2014 0700   CO2 27 09/05/2014 0700   GLUCOSE 78 09/05/2014 0700   BUN 18 09/05/2014 0700   CREATININE 5.35* 09/05/2014 0700   CREATININE 7.80* 01/16/2014  1437   CALCIUM 7.3* 09/05/2014 0700   PROT 4.6* 08/29/2014 0520   ALBUMIN 1.8* 09/05/2014 0700   AST 34 08/29/2014 0520   ALT 14 08/29/2014 0520   ALKPHOS 108 08/29/2014 0520   BILITOT 1.2 08/29/2014 0520   GFRNONAA 11* 09/05/2014 0700   GFRAA 13* 09/05/2014 0700    GFR Estimated Creatinine Clearance: 10.7 mL/min (by C-G formula based on Cr of 5.35). No results for input(s): LIPASE, AMYLASE in the last 72 hours. No results for input(s): CKTOTAL, CKMB, CKMBINDEX, TROPONINI in the last 72 hours. Invalid input(s): POCBNP No results for input(s): DDIMER in the last 72 hours. No results for input(s): HGBA1C in the last 72 hours. No results for input(s): CHOL, HDL, LDLCALC, TRIG, CHOLHDL, LDLDIRECT in the last 72 hours. No results for input(s): TSH, T4TOTAL, T3FREE, THYROIDAB in the last 72 hours.  Invalid input(s): FREET3 No results for input(s): VITAMINB12, FOLATE, FERRITIN, TIBC, IRON, RETICCTPCT in the last 72 hours. Coags: No results for input(s): INR in the last 72 hours.  Invalid input(s): PT Microbiology: Recent Results (from the past 240 hour(s))  Surgical pcr screen     Status: None   Collection Time: 08/26/14  5:20 PM  Result Value Ref Range Status   MRSA, PCR NEGATIVE NEGATIVE Final   Staphylococcus aureus NEGATIVE NEGATIVE Final    Comment:        The Xpert SA Assay (FDA approved for NASAL specimens in patients over 77 years of age), is one component of a comprehensive surveillance program.  Test performance has been validated by [employer] for patients greater than or equal to 27 year old. It is not intended to diagnose infection nor to guide or monitor treatment.      BRIEF HOSPITAL COURSE:  Brief narrative: 53 year old male patient with known vascular disease, chronic kidney disease on dialysis, hypertension, chronic steroids for gout brought to the emergency room on 08/16/14 with severe right leg pain, evaluated by vascular surgery, and underwent  right femoral to below the knee popliteal artery bypass  graft on 11/27. Subsequently patient developed significant left leg swelling, with concerns for left venous stenosis related to his left thigh arteriovenous graft, on 12/3 patient underwent left thigh shuntogram with subsequent venoplasty and stenting of the left common femoral vein.Previous left foot swelling improved remarkably after this procedure. On 12/5 patient developed significant hematochezia, unfortunately developed hemorrhagic shock and required ICU transfer. He received a total of 11 units of PRBC, 4 units of FFP and temporarily required pressors to maintain blood pressure. Aspirin/Plavix were placed on hold. He slowly stabilized, hematochezia completely resolved, on 12/11 he started having brown bowel movements. Thankfully continues to slowly improve, remains deconditioned, seen by physical therapy, plans are to discharge with home health services. Aspirin and Plavix were subsequently resumed, he has had no further bleeding for the past 4-5 days, stools remain brown in color. Hemoglobin remained stable.  Hospital Course by Problem List: Hemorrhagic shock: Secondary to lower GI bleed.Shock has resolved - pt is hemodynamically stable.  Lower GI bleed: presumed to be related to ischemic colitis. Causing above GI consulted. Patient was managed in the intensive care unit with supportive care which include transient pressors, and PRBC transfusion. Plans were to pursue colonoscopy, however patient refused to comply with the preparation. Bleeding has completely resolved, patient passing brown stools. No further recommendations from gastroenterology, spoke with Dr. Henrene Pastor on 12/13 okay to resume aspirin/Plavix. Remained stable on aspirin and Plavix without any further bleeding.  Acute blood loss anemia: Secondary to above. Hemoglobin stable. Has received a total of 11 units of PRBC per prior notes. Hemoglobin remained stable, discharge hemoglobin at  10.3.  Right Ischemic TFT:DDUKGUR dry gangrene of right foot - status post right femoropopliteal bypass on 11/27 - Vas Surg following-continues to have chronic dry gangrene of right foot. Patient will need follow-up with vascular surgery as an outpatient for further continued care.  Left femoral vein occlusion:causing left leg swelling-now s/p Venoplasty of left common femoral vein x 2 and Stenting of left common femoral vein. No further left leg swelling.  ESRD on dialysis:Nephrology consulted during this hospital stay - HD on Tuesdays Thursdays and Saturdays. Patient aware that he needs to follow-up with his hemodialysis center on discharge.  History of CAD with prior drug-eluting stent April 2015:no acute cardiac issues during this hospitalization - aspirin and Plavix held due to recurrent GI bleeding,since GI bleeding has resolved, spoke with Dr Henrene Pastor on 12/13-advised to resume. Continue Lipitor  HTN: Controlled, continue with amlodipine, Coreg on discharge.  History of gout: continue chronic prednisone.Continue Colchicine on discharge.  Hypoglycemia:encourage oral intake-suspect this is secondary to acute illness/poor oral itnake. CBGs subsequently stable.  Severe protein calorie malnutrition:c/w supplements on discharge  Deconditioning/generalized weakness: Secondary to acute illness, prolonged hospitalization, patient prefers to go home with home health services and would not like to go to SNF. He has met maximum benefit from this hospital stay, and is deemed stable for discharge today.   TODAY-DAY OF DISCHARGE:  Subjective:   Cian Costanzo today has no headache,no chest abdominal pain,no new weakness tingling or numbness, feels much better wants to go home today.   Objective:   Blood pressure 189/60, pulse 81, temperature 98.4 F (36.9 C), temperature source Oral, resp. rate 14, height $RemoveBe'5\' 11"'PoguTYiIH$  (1.803 m), weight 48.4 kg (106 lb 11.2 oz), SpO2 98 %.  Intake/Output Summary (Last  24 hours) at 09/05/14 1021 Last data filed at 09/05/14 0514  Gross per 24 hour  Intake    270 ml  Output  3502 ml  Net  -3232 ml   Filed Weights   09/04/14 1125 09/04/14 2117 09/05/14 0653  Weight: 47.1 kg (103 lb 13.4 oz) 47.1 kg (103 lb 13.4 oz) 48.4 kg (106 lb 11.2 oz)    Exam Awake Alert, Oriented *3, No new F.N deficits, Normal affect [place].AT,PERRAL Supple Neck,No JVD, No cervical lymphadenopathy appriciated.  Symmetrical Chest wall movement, Good air movement bilaterally, CTAB RRR,No Gallops,Rubs or new Murmurs, No Parasternal Heave +ve B.Sounds, Abd Soft, Non tender, No organomegaly appriciated, No rebound -guarding or rigidity. No Cyanosis, Clubbing or edema, No new Rash or bruise  DISCHARGE CONDITION: Stable  DISPOSITION: Home with home health services  DISCHARGE INSTRUCTIONS:    Activity:  As tolerated with Full fall precautions use walker/cane & assistance as needed  Diet recommendation: Heart Healthy diet  Discharge Instructions    Call MD for:  redness, tenderness, or signs of infection (pain, swelling, redness, odor or green/yellow discharge around incision site)    Complete by:  As directed      Call MD for:  severe uncontrolled pain    Complete by:  As directed      Diet - low sodium heart healthy    Complete by:  As directed      Increase activity slowly    Complete by:  As directed            Follow-up Information    Follow up with [facility].   Why:  HH-PT arranged   Contact information:   [address] [place] [address] [phone]       Schedule an appointment as soon as possible for a visit in 1 week to follow up.   Contact information:   Hemodialysis Center at usual schedule      Follow up with Shelva Majestic A, MD. Schedule an appointment as soon as possible for a visit in 2 weeks.   Specialty:  Cardiology   Contact information:   [address] [place] [address] [phone]        Follow up with Ramond Craver, [place], MD. Schedule an appointment as soon as possible for a visit in 2 weeks.   Specialty:  Vascular Surgery   Contact information:   [address] [place] [address] [phone]      Total Time spent on discharge equals 45 minutes.  SignedOren Binet 09/05/2014 10:21 AM

## 2014-09-05 NOTE — Care Management Note (Addendum)
CARE MANAGEMENT NOTE 09/05/2014  Patient:  [name], [name]   Account Number:  [id number]  Date Initiated:  08/23/2014  Documentation initiated by:  Marvetta Gibbons  Subjective/Objective Assessment:   Pt admitted with R leg ischemia, S/P right by-pass femoral to below knee popliteal artery bypass graft     Action/Plan:   PTA pt lived at home- PT/OT evals- recommmendation for SNF- pt refusing and wants to return home- agreeable to [facility], has RW at home   Anticipated DC Date:  09/05/2014   Anticipated DC Plan:  [place]  CM consult      [facility]   Choice offered to / List presented to:  C-1 Patient   DME arranged  3-N-1      DME agency  [place] arranged  HH-2 PT  HH-1 RN    [facility] OT   [place] aide  [facility] agency  [place].   Status of service:  In process, will continue to follow Medicare Important Message given?  YES (If response is "NO", the following Medicare IM given date fields will be blank) Date Medicare IM given:  08/29/2014 Medicare IM given by:  MAYO,HENRIETTA Date Additional Medicare IM given:  08/25/2014 Additional Medicare IM given by:  Marvetta Gibbons  Discharge Disposition:  [place]  Per UR Regulation:  Reviewed for med. necessity/level of care/duration of stay  If discussed at [place] of Stay Meetings, dates discussed:   08/24/2014  08/29/2014  08/31/2014    Comments:    09/05/2014 IM given CRoyal RN MPH, case manager, [phone] 09/05/2014 Pt for d/c today, [facility] to provide [place] services. Discussed d/c needs with pt , he is requesting a 3:1 commode. [facility] given to [facility] as pt plans to d/c to sister's home at [place], [place]. Sister is Ms Westley Hummer @ [phone], pt cell number is [phone]. CRoyal RN MPH, case manager, [phone]   08/31/14 [place] RN MSN BSN CCM Pt now on PRN hydralazine for HTN.  Pt was  referred to [facility] for PT by previous CM.  Discussed with pt that he is @ high risk for hospital readmission and requested he consider referral to a different agency.  Pt response is "no, I had [facility] before and I want them again."  Notified [facility] to add [facility] RN and that pt needs to be placed in their high risk program.  08/29/14 [place] RN MSN BSN CCM Pt having bloody stools, hypotension, started on Neo gtt. GI following.  08/28/14 [place] MSN BSN CCM Transferred to 2S 2/2 GI Bleed, systolic 96Q.  Requested LTAC eval.  08/24/14- 1400- Marvetta Gibbons RN, BSN [phone] For shuntogram today left thigh  08/23/14- 64- Marvetta Gibbons RN, BSN [phone] Referral for [facility] needs- order for HH-PT- spoke with pt at bedside- pt states that he has had [facility] in past- does not want to go to SNF for rehab agreeable to having [facility] again- has used [facility] before and wants to use them again- referral called to Amy with [facility]- for HH-PT- services will begin within 24-48 hr post discharge.

## 2014-09-05 NOTE — Progress Notes (Signed)
Physical Therapy Treatment Patient Details Name: [name] MRN: [id number] DOB: 06-Jun-1961 Today's Date: 09/05/2014    History of Present Illness  right femoral to below the knee popliteal artery bypass graft on 11/27. 53 year old male patient with known vascular disease, chronic kidney disease on dialysis, hypertension, chronic steroids for gout brought to the emergency room on 08/16/14 with severe right leg pain, evaluated by vascular surgery, and underwent right femoral to below the knee popliteal artery bypass graft on 11/27. Subsequently patient developed significant left leg swelling, with concerns for left venous stenosis related to his left thigh arteriovenous graft, on 12/3 patient underwentleft thigh shuntogram with subsequent venoplasty and stenting of the left common femoral vein.Previous left foot swelling improved remarkably after this procedure. On 12/5 patient developed significant hematochezia, unfortunately developed hemorrhagic shock and required ICU transfer. Transferred to floor 12/13.    PT Comments    Pt progressing towards physical therapy goals. Pt was able to ambulate in the halls however was limited by pain. RN notified and pain medication was requested for pt. He anticipates d/c home today with sister for assistance. If pt's family cannot find RW at home will need RW before d/c.   Follow Up Recommendations  Home health PT     Equipment Recommendations  3in1 (PT);Rolling walker with 5" wheels    Recommendations for Other Services       Precautions / Restrictions Precautions Precautions: Fall Required Braces or Orthoses: Other Brace/Splint Other Brace/Splint: DARCO shoe for R foot Restrictions Weight Bearing Restrictions: No    Mobility  Bed Mobility Overal bed mobility: Needs Assistance Bed Mobility: Supine to Sit     Supine to sit: Modified independent (Device/Increase time)     General bed mobility comments: No physical assist required.    Transfers Overall transfer level: Needs assistance Equipment used: None Transfers: Sit to/from [facility] Sit to Stand: Supervision Stand pivot transfers: Supervision       General transfer comment: Pt able to transition bed to chair without AD and no physical assistance.   Ambulation/Gait Ambulation/Gait assistance: Supervision Ambulation Distance (Feet): 60 Feet Assistive device: Rolling walker (2 wheeled) Gait Pattern/deviations: Step-through pattern;Decreased stride length;Trunk flexed;Narrow base of support Gait velocity: Decreased Gait velocity interpretation: Below normal speed for age/gender General Gait Details: Very slow and guarded. Pt states he has not had pain medicine today and is limited by pain during gait training. Pt required chair ride back to the room as he was not able to tolerate the pain.    Stairs            Wheelchair Mobility    Modified Rankin (Stroke Patients Only)       Balance Overall balance assessment: Needs assistance Sitting-balance support: Feet supported;No upper extremity supported Sitting balance-Leahy Scale: Normal     Standing balance support: No upper extremity supported Standing balance-Leahy Scale: Fair                      Cognition Arousal/Alertness: Awake/alert Behavior During Therapy: WFL for tasks assessed/performed Overall Cognitive Status: Within Functional Limits for tasks assessed                      Exercises      General Comments        Pertinent Vitals/Pain Pain Assessment: Faces Faces Pain Scale: Hurts whole lot Pain Location: R foot/lower leg Pain Intervention(s): Limited activity within patient's tolerance;Premedicated before session;Monitored during session;Patient requesting pain meds-RN notified  Home Living                      Prior Function            PT Goals (current goals can now be found in the care plan section) Acute Rehab PT  Goals Patient Stated Goal: to be able to take care of myself PT Goal Formulation: With patient Time For Goal Achievement: 09/13/14 Potential to Achieve Goals: Good Progress towards PT goals: Progressing toward goals    Frequency  Min 3X/week    PT Plan Current plan remains appropriate    Co-evaluation             End of Session Equipment Utilized During Treatment: Gait belt Activity Tolerance: Patient limited by pain;Patient limited by fatigue Patient left: in chair;with call bell/phone within reach     Time: 1359-1424 PT Time Calculation (min) (ACUTE ONLY): 25 min  Charges:  $Gait Training: 8-22 mins $Therapeutic Activity: 8-22 mins                    G Codes:      Rolinda Roan Oct 02, 2014, 2:35 PM   Rolinda Roan, PT, DPT Acute Rehabilitation Services Pager: [phone]

## 2014-09-05 NOTE — Progress Notes (Signed)
Occupational Therapy Treatment Patient Details Name: [name] MRN: [id number] DOB: 1961/01/27 Today's Date: 09/05/2014    History of present illness underwent right femoral to below the knee popliteal artery bypass graft on 11/27. on 12/3 patient underwentleft thigh shuntogram with subsequent venoplasty and stenting of the left common femoral veinOn 12/5 patient developed significant hematochezia, unfortunately developed hemorrhagic shock and required ICU transfer. Transferred to floor 12/13.   OT comments  Pt making steady progress. Safe to D/C home with 24/7 S and HHOT/PT. Completed education with pt/ All further OT to be addressed by [facility] [place].  Follow Up Recommendations  Home health OT;Supervision/Assistance - 24 hour    Equipment Recommendations  3 in 1 bedside comode    Recommendations for Other Services      Precautions / Restrictions Precautions Precautions: Fall Required Braces or Orthoses: Other Brace/Splint Other Brace/Splint: DARCO shoe for R foot Restrictions Weight Bearing Restrictions: No       Mobility    Balance Overall balance assessment: Needs assistance Sitting-balance support: Feet supported;No upper extremity supported Sitting balance-Leahy Scale: Normal     Standing balance support: No upper extremity supported Standing balance-Leahy Scale: Fair                     ADL                                         General ADL Comments: Educated on safe use of 3 in 1 for ADL and home set up to facilitate independence and safety with ADL. Also educated on E conservation techniques. discussed importance on closely monitoring B feet and using a mirror to assess plantar surface. Pt verbalized understanding.       Vision                     Perception     Praxis      Cognition   Behavior During Therapy: WFL for tasks assessed/performed Overall Cognitive Status: Within Functional Limits for tasks assessed                       Extremity/Trunk Assessment               Exercises     Shoulder Instructions       General Comments      Pertinent Vitals/ Pain       Pain Assessment: No/denies pain Faces Pain Scale: Hurts whole lot Pain Location: R foot/lower leg Pain Intervention(s): Limited activity within patient's tolerance;Premedicated before session;Monitored during session;Patient requesting pain meds-RN notified  Home Living                                          Prior Functioning/Environment              Frequency Min 2X/week     Progress Toward Goals  OT Goals(current goals can now be found in the care plan section)  Progress towards OT goals: Progressing toward goals  Acute Rehab OT Goals Patient Stated Goal: to be able to take care of myself OT Goal Formulation: With patient Time For Goal Achievement: 09/18/14 Potential to Achieve Goals: Good ADL Goals Pt Will Perform Lower Body Bathing: with modified independence;sit to/from stand Pt Will Perform  Lower Body Dressing: with modified independence;sit to/from stand Pt Will Transfer to Toilet: with modified independence;ambulating Pt Will Perform Toileting - Clothing Manipulation and hygiene: with modified independence;sit to/from stand Pt/caregiver will Perform Home Exercise Program: Increased ROM;Both right and left upper extremity;With theraband;Independently  Plan Discharge plan remains appropriate    Co-evaluation                 End of Session     Activity Tolerance Patient tolerated treatment well   Patient Left in chair;with call bell/phone within reach   Nurse Communication Mobility status        Time: 7588-3254 OT Time Calculation (min): 16 min  Charges: OT General Charges $OT Visit: 1 Procedure OT Treatments $Self Care/Home Management : 8-22 mins  Ysidro Ramsay,HILLARY 09/05/2014, 4:23 PM   [facility], OTR/L  [phone] 09/05/2014

## 2014-09-05 NOTE — Procedures (Signed)
I was present at this dialysis session, have reviewed the session itself and made  appropriate changes  [name] MD (pgr) [phone]    ([phone] 09/05/2014, 11:16 AM

## 2014-09-05 NOTE — Progress Notes (Signed)
Subjective:  Seen on dialysis, weak, no blood in stool  Objective: Vital signs in last 24 hours: Temp:  [97 F (36.1 C)-98.9 F (37.2 C)] 98.4 F (36.9 C) (12/15 0653) Pulse Rate:  [73-95] 73 (12/15 0800) Resp:  [13-16] 13 (12/15 0800) BP: (118-180)/(40-73) 144/66 mmHg (12/15 0800) SpO2:  [96 %-98 %] 98 % (12/15 0653) Weight:  [47.1 kg (103 lb 13.4 oz)-48.4 kg (106 lb 11.2 oz)] 48.4 kg (106 lb 11.2 oz) (12/15 0653) Weight change:   Intake/Output from previous day: 12/14 0701 - 12/15 0700 In: 270 [P.O.:240; I.V.:30] Out: 3502  Intake/Output this shift:   Lab Results:  Recent Labs  09/04/14 0500 09/05/14 0700  WBC 13.7* 11.4*  HGB 10.3* 10.3*  HCT 32.5* 33.0*  PLT 139* 158   BMET:  Recent Labs  09/04/14 0830 09/05/14 0700  NA 137 140  K 4.7 4.0  CL 99 100  CO2 23 27  GLUCOSE 99 78  BUN 36* 18  CREATININE 7.55* 5.35*  CALCIUM 7.3* 7.3*  ALBUMIN 1.8* 1.8*   No results for input(s): PTH in the last 72 hours. Iron Studies: No results for input(s): IRON, TIBC, TRANSFERRIN, FERRITIN in the last 72 hours.  Studies/Results: No results found.   EXAM: General appearance: Alert, in no apparent distress Resp: CTA with rales, rhonchi, or wheezes Cardio: RRR without murmur or rub GI: + BS, soft and nontender Extremities: Clean dressing at R thigh, no edema, dry gangrene 1st-3rd toes Access: L thigh AVG with BFR 400  HD: TTS [facility] 3.5h 46.5 kg 2K/2Ca 400/800 Profile 2 Heparin 4000 U L thigh AVG  Calcitriol 2.75 mcg Aranesp 25 mcg on Thurs Venofer 100 mg through 12/3, then qwk  Assessment/Plan: 1. Acute GI bleed - refused colonoscopy, unable to tolerate prep, no recent noticeable blood in stool, no Heparin with HD.  Asa and plavix to be restarted for presence of DES 2. Ischemic R foot - s/p R fem-pop bypass 11/27 per Dr. Trula Slade. 3. Dialysis access - s/p L thigh shuntogram with venoplasty & stenting of L common femoral vein per Dr.  Bridgett Larsson 12/3. 4. ESRD - HD on TTS, yesterday sec to difficulty with arterial needle on 12/12, now on regular schedule, K 4. 5. HTN/Volume - BP 144/66 on Amlodipine 10 mg qd, Carvedilol 6.25 mg bid; wt 48.4 kg s/p net UF 3.5 L yesterday. 6. Anemia - Hgb 10.3, s/p multiple transfusions (most recent 12/10), Aranesp 60 mcg on Sat.  7. Sec HPT - Ca 7.3 (9.1 corrected); Calcitriol 2.75 mcg, Sensipar 60 mg qd, Phoslo 3 with meals. 8. Nutrition - Alb 1.8, renal diet, vitamin. 9. Hx CAD - s/p cath w/ PCI 12/2013. 10. Hepatitis C 11. Renal transplant - 1987, failed in 2004     LOS: 20 days   [name],CHARLES 09/05/2014,8:46 AM  Pt seen, examined, agree w assess/plan as above with additions as indicated.  [name] MD pager [phone]    cell [phone] 09/05/2014, 11:15 AM

## 2014-09-06 LAB — GLUCOSE, CAPILLARY: Glucose-Capillary: 82 mg/dL (ref 70–99)

## 2014-09-08 ENCOUNTER — Inpatient Hospital Stay (HOSPITAL_COMMUNITY)
Admission: EM | Admit: 2014-09-08 | Discharge: 2014-09-21 | DRG: 981 | Disposition: A | Discharge status: Home Health | Payer: Medicare Other | Attending: Internal Medicine | Admitting: Internal Medicine

## 2014-09-08 ENCOUNTER — Encounter: Payer: Self-pay | Admitting: Surgery

## 2014-09-08 ENCOUNTER — Encounter (HOSPITAL_COMMUNITY): Payer: Self-pay | Admitting: Emergency Medicine

## 2014-09-08 DIAGNOSIS — Z681 Body mass index (BMI) 19 or less, adult: Secondary | ICD-10-CM | POA: Diagnosis not present

## 2014-09-08 DIAGNOSIS — M109 Gout, unspecified: Secondary | ICD-10-CM | POA: Diagnosis present

## 2014-09-08 DIAGNOSIS — I2510 Atherosclerotic heart disease of native coronary artery without angina pectoris: Secondary | ICD-10-CM | POA: Diagnosis present

## 2014-09-08 DIAGNOSIS — Z7952 Long term (current) use of systemic steroids: Secondary | ICD-10-CM | POA: Diagnosis not present

## 2014-09-08 DIAGNOSIS — Z992 Dependence on renal dialysis: Secondary | ICD-10-CM

## 2014-09-08 DIAGNOSIS — I5042 Chronic combined systolic (congestive) and diastolic (congestive) heart failure: Secondary | ICD-10-CM | POA: Diagnosis present

## 2014-09-08 DIAGNOSIS — D631 Anemia in chronic kidney disease: Secondary | ICD-10-CM | POA: Diagnosis present

## 2014-09-08 DIAGNOSIS — Z87891 Personal history of nicotine dependence: Secondary | ICD-10-CM

## 2014-09-08 DIAGNOSIS — D62 Acute posthemorrhagic anemia: Secondary | ICD-10-CM

## 2014-09-08 DIAGNOSIS — K219 Gastro-esophageal reflux disease without esophagitis: Secondary | ICD-10-CM | POA: Diagnosis present

## 2014-09-08 DIAGNOSIS — I739 Peripheral vascular disease, unspecified: Secondary | ICD-10-CM | POA: Diagnosis present

## 2014-09-08 DIAGNOSIS — M1A9XX1 Chronic gout, unspecified, with tophus (tophi): Secondary | ICD-10-CM | POA: Diagnosis present

## 2014-09-08 DIAGNOSIS — R64 Cachexia: Secondary | ICD-10-CM | POA: Diagnosis present

## 2014-09-08 DIAGNOSIS — A480 Gas gangrene: Secondary | ICD-10-CM | POA: Diagnosis present

## 2014-09-08 DIAGNOSIS — Z7982 Long term (current) use of aspirin: Secondary | ICD-10-CM

## 2014-09-08 DIAGNOSIS — E119 Type 2 diabetes mellitus without complications: Secondary | ICD-10-CM | POA: Diagnosis present

## 2014-09-08 DIAGNOSIS — K5731 Diverticulosis of large intestine without perforation or abscess with bleeding: Secondary | ICD-10-CM | POA: Diagnosis present

## 2014-09-08 DIAGNOSIS — L97529 Non-pressure chronic ulcer of other part of left foot with unspecified severity: Secondary | ICD-10-CM | POA: Diagnosis present

## 2014-09-08 DIAGNOSIS — I10 Essential (primary) hypertension: Secondary | ICD-10-CM | POA: Diagnosis present

## 2014-09-08 DIAGNOSIS — N2581 Secondary hyperparathyroidism of renal origin: Secondary | ICD-10-CM | POA: Diagnosis present

## 2014-09-08 DIAGNOSIS — Z9861 Coronary angioplasty status: Secondary | ICD-10-CM

## 2014-09-08 DIAGNOSIS — N186 End stage renal disease: Secondary | ICD-10-CM | POA: Diagnosis present

## 2014-09-08 DIAGNOSIS — E43 Unspecified severe protein-calorie malnutrition: Secondary | ICD-10-CM | POA: Diagnosis present

## 2014-09-08 DIAGNOSIS — Z940 Kidney transplant status: Secondary | ICD-10-CM

## 2014-09-08 DIAGNOSIS — D72829 Elevated white blood cell count, unspecified: Secondary | ICD-10-CM

## 2014-09-08 DIAGNOSIS — M869 Osteomyelitis, unspecified: Secondary | ICD-10-CM | POA: Diagnosis present

## 2014-09-08 DIAGNOSIS — Z7902 Long term (current) use of antithrombotics/antiplatelets: Secondary | ICD-10-CM | POA: Diagnosis not present

## 2014-09-08 DIAGNOSIS — L97519 Non-pressure chronic ulcer of other part of right foot with unspecified severity: Secondary | ICD-10-CM | POA: Diagnosis present

## 2014-09-08 DIAGNOSIS — Z79899 Other long term (current) drug therapy: Secondary | ICD-10-CM

## 2014-09-08 DIAGNOSIS — Z79891 Long term (current) use of opiate analgesic: Secondary | ICD-10-CM | POA: Diagnosis not present

## 2014-09-08 DIAGNOSIS — B182 Chronic viral hepatitis C: Secondary | ICD-10-CM | POA: Diagnosis present

## 2014-09-08 DIAGNOSIS — Z955 Presence of coronary angioplasty implant and graft: Secondary | ICD-10-CM | POA: Diagnosis not present

## 2014-09-08 DIAGNOSIS — K922 Gastrointestinal hemorrhage, unspecified: Secondary | ICD-10-CM | POA: Diagnosis present

## 2014-09-08 DIAGNOSIS — L089 Local infection of the skin and subcutaneous tissue, unspecified: Secondary | ICD-10-CM | POA: Diagnosis present

## 2014-09-08 DIAGNOSIS — I120 Hypertensive chronic kidney disease with stage 5 chronic kidney disease or end stage renal disease: Secondary | ICD-10-CM | POA: Diagnosis present

## 2014-09-08 DIAGNOSIS — B1920 Unspecified viral hepatitis C without hepatic coma: Secondary | ICD-10-CM | POA: Diagnosis present

## 2014-09-08 LAB — CBC WITH DIFFERENTIAL/PLATELET
BASOS PCT: 0 % (ref 0–1)
Basophils Absolute: 0 10*3/uL (ref 0.0–0.1)
Eosinophils Absolute: 0.3 10*3/uL (ref 0.0–0.7)
Eosinophils Relative: 3 % (ref 0–5)
HCT: 40 % (ref 39.0–52.0)
Hemoglobin: 12.3 g/dL — ABNORMAL LOW (ref 13.0–17.0)
Lymphocytes Relative: 15 % (ref 12–46)
Lymphs Abs: 1.5 10*3/uL (ref 0.7–4.0)
MCH: 28.2 pg (ref 26.0–34.0)
MCHC: 30.8 g/dL (ref 30.0–36.0)
MCV: 91.7 fL (ref 78.0–100.0)
Monocytes Absolute: 1.3 10*3/uL — ABNORMAL HIGH (ref 0.1–1.0)
Monocytes Relative: 13 % — ABNORMAL HIGH (ref 3–12)
NEUTROS ABS: 7 10*3/uL (ref 1.7–7.7)
Neutrophils Relative %: 69 % (ref 43–77)
Platelets: 206 10*3/uL (ref 150–400)
RBC: 4.36 MIL/uL (ref 4.22–5.81)
RDW: 16.6 % — ABNORMAL HIGH (ref 11.5–15.5)
WBC: 10.2 10*3/uL (ref 4.0–10.5)

## 2014-09-08 LAB — PROTIME-INR
INR: 1.16 (ref 0.00–1.49)
PROTHROMBIN TIME: 14.9 s (ref 11.6–15.2)

## 2014-09-08 LAB — COMPREHENSIVE METABOLIC PANEL
ALBUMIN: 2.2 g/dL — AB (ref 3.5–5.2)
ALT: 15 U/L (ref 0–53)
ANION GAP: 11 (ref 5–15)
AST: 36 U/L (ref 0–37)
Alkaline Phosphatase: 166 U/L — ABNORMAL HIGH (ref 39–117)
BILIRUBIN TOTAL: 1.1 mg/dL (ref 0.3–1.2)
BUN: 20 mg/dL (ref 6–23)
CO2: 33 meq/L — AB (ref 19–32)
CREATININE: 5.55 mg/dL — AB (ref 0.50–1.35)
Calcium: 8.6 mg/dL (ref 8.4–10.5)
Chloride: 97 mEq/L (ref 96–112)
GFR, EST AFRICAN AMERICAN: 12 mL/min — AB (ref >90)
GFR, EST NON AFRICAN AMERICAN: 10 mL/min — AB (ref >90)
Glucose, Bld: 85 mg/dL (ref 70–99)
Potassium: 4 mEq/L (ref 3.7–5.3)
Sodium: 141 mEq/L (ref 137–147)
Total Protein: 6.3 g/dL (ref 6.0–8.3)

## 2014-09-08 NOTE — ED Notes (Signed)
Per EMS: Pt was discharged Tuesday from inpatient care where he had a stent placed.  During his stay he had a GI bleed which resolved during his stay.  Pt is currently taking blood thinners for his coronary stents.  Pt has hx of renal disease, stents/low blood flow in legs, and multiple GI surgeries (including correction of double bowel obstruction).  Pt has a bloody bowel movement this evening, approx 8pm.  EMS estimated 268ml with some small clots.  This was his first bloody bowel movement (bright red) since discharge.

## 2014-09-08 NOTE — ED Provider Notes (Signed)
CSN: [id number]     Arrival date & time 09/08/14  2043 History   First MD Initiated Contact with Patient 09/08/14 2111     Chief Complaint  Patient presents with  . GI Bleeding     (Consider location/radiation/quality/duration/timing/severity/associated sxs/prior Treatment) Patient is a 53 y.o. male presenting with hematochezia. The history is provided by the patient and a relative.  Rectal Bleeding Quality:  Maroon Amount:  Copious Duration:  1 day Timing:  Constant Progression:  Worsening Chronicity:  Recurrent Context: defecation   Similar prior episodes: yes   Relieved by:  Nothing Worsened by:  Defecation Associated symptoms: no abdominal pain, no dizziness, no fever, no hematemesis, no light-headedness, no loss of consciousness and no vomiting     Past Medical History  Diagnosis Date  . ESRD on hemodialysis     a. ESRD 2/2 HTN with renal transplant in 1987 (cadaveric) after short period of dialysis;  b. Transplant failed in 2004 and he went back on HD;  c. As of 10/15 getting HD via L thigh AVG on a TTS schedule at [facility] on [address].  . Hypertension   . Hx of kidney transplant     a. 1987-> back on HD since 2004  . Gout tophi   . Chronic steroid use     a. Has severe gout. Did not tolerate Allopurinol. Do not taper per PCP   . Systolic CHF, chronic     2 D echo 04/2012 with EF of 45 %   . Malnutrition   . Hx SBO 04/2012    a. 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 F/U with Dr Donne Hazel. Started on TNA  . Anemia associated with chronic renal failure   . Hepatitis C   . GERD (gastroesophageal reflux disease)   . H/O hiatal hernia   . Arthritis   . History of DVT (deep vein thrombosis)   . Peripheral vascular disease     a. 12/2013 PTA of L Pop;  b. 12/2013 PTA R pop, R DP;  c. 06/2014 L Pop CBA/DCB PTA.  . Prostate cancer   . CAD (coronary artery disease)     a. 12/2013 Cath/PCI: EF  45-50%, LM Ca2+, LAD 30-40p, 31m, D1/2/3 min irregs, LCX 50ost, 30-108m, RCA 70p, 95/50m (Rota->3.0x23 Xience distal, 3.0x23 Xience mid, 3.0x28 Xience prox).  . Chronic diastolic CHF (congestive heart failure)     a. 12/2013 Echo: EF 55-60%, mild LVH, nl wall motion, Gr 2 DD.  . Dry gangrene     a. L great toe   Past Surgical History  Procedure Laterality Date  . Laparotomy  04/14/2012    Procedure: EXPLORATORY LAPAROTOMY;  Surgeon: Joyice Faster. Cornett, MD;  Location: [place];  Service: General;  Laterality: N/A;  . Colon resection  04/14/2012  . Laparotomy  04/22/2012    Procedure: EXPLORATORY LAPAROTOMY;  Surgeon: Adin Hector, MD;  Location: [place];  Service: General;  Laterality: N/A;  lysis of adhesions, omentoplasty, repair small bowel  . Thrombectomy w/ embolectomy  04/27/2012    Procedure: THROMBECTOMY ARTERIOVENOUS GORE-TEX GRAFT;  Surgeon: Angelia Mould, MD;  Location: [place];  Service: Vascular;  Laterality: Left;  Thrombectomy of left thigh arteriovenous gortex graft  . Prostectomy  2011  . Renal grafts    . Pelvic abcess drainage Right 6/14    removal drain s/p bowl resection 13  . Transanal excision of rectal mass  N/A 03/30/2013    Procedure: EXCISION OF anal MASS;  Surgeon: Joyice Faster. Cornett, MD;  Location: [place];  Service: General;  Laterality: N/A;  Exam under anesthesia with excision anal verge mass  . Aortogram  08/15/2014    abd aortogram  . Femoral-popliteal bypass graft Right 08/18/2014    Procedure: BYPASS GRAFT FEMORAL-POPLITEAL ARTERY with Gortex Graft;  Surgeon: Serafina Mitchell, MD;  Location: [place];  Service: Vascular;  Laterality: Right;  . Shuntogram Left 09/29/2012    Procedure: SHUNTOGRAM;  Surgeon: Serafina Mitchell, MD;  Location: [facility] CATH LAB;  Service: Cardiovascular;  Laterality: Left;  . Abdominal aortagram N/A 01/03/2014    Procedure: ABDOMINAL Maxcine Ham;  Surgeon: Serafina Mitchell, MD;  Location: [facility] CATH LAB;  Service: Cardiovascular;  Laterality: N/A;  .  Abdominal aortagram Right 01/11/2014    Procedure: ABDOMINAL AORTAGRAM;  Surgeon: Serafina Mitchell, MD;  Location: [facility] CATH LAB;  Service: Cardiovascular;  Laterality: Right;  . Left heart catheterization with coronary angiogram N/A 01/12/2014    Procedure: LEFT HEART CATHETERIZATION WITH CORONARY ANGIOGRAM;  Surgeon: Leonie Man, MD;  Location: [facility] CATH LAB;  Service: Cardiovascular;  Laterality: N/A;  . Percutaneous coronary rotoblator intervention (pci-r) N/A 01/13/2014    Procedure: PERCUTANEOUS CORONARY ROTOBLATOR INTERVENTION (PCI-R);  Surgeon: Burnell Blanks, MD;  Location: [facility] CATH LAB;  Service: Cardiovascular;  Laterality: N/A;  . Abdominal aortagram N/A 07/19/2014    Procedure: ABDOMINAL AORTAGRAM;  Surgeon: Serafina Mitchell, MD;  Location: [facility] CATH LAB;  Service: Cardiovascular;  Laterality: N/A;  . Shuntogram N/A 08/24/2014    Procedure: Earney Mallet;  Surgeon: Conrad Rarden, MD;  Location: [facility] CATH LAB;  Service: Cardiovascular;  Laterality: N/A;   Family History  Problem Relation Age of Onset  . Hypertension Father   . Pneumonia Brother   . Lung disease Brother    History  Substance Use Topics  . Smoking status: Former Smoker -- 1.00 packs/day for 10 years    Types: Cigarettes    Quit date: 03/23/1973  . Smokeless tobacco: Never Used     Comment: Quit 30 years ago  . Alcohol Use: No    Review of Systems  Constitutional: Negative for fever, diaphoresis, activity change and appetite change.  HENT: Negative for facial swelling, sore throat, tinnitus, trouble swallowing and voice change.   Eyes: Negative for pain, redness and visual disturbance.  Respiratory: Negative for chest tightness, shortness of breath and wheezing.   Cardiovascular: Negative for chest pain, palpitations and leg swelling.  Gastrointestinal: Positive for blood in stool, hematochezia and anal bleeding. Negative for nausea, vomiting, abdominal pain, diarrhea, constipation, abdominal distention, rectal  pain and hematemesis.  Endocrine: Negative.   Genitourinary: Negative.  Negative for dysuria, decreased urine volume, scrotal swelling and testicular pain.  Musculoskeletal: Negative for myalgias, back pain and gait problem.       Leg pain  Skin: Negative.  Negative for rash.  Neurological: Negative.  Negative for dizziness, tremors, loss of consciousness, weakness, light-headedness and headaches.  Psychiatric/Behavioral: Negative for suicidal ideas, hallucinations and self-injury. The patient is not nervous/anxious.       Allergies  Allopurinol  Home Medications   Prior to Admission medications   Medication Sig Start Date End Date Taking? Authorizing Provider  amLODipine (NORVASC) 5 MG tablet Take 2 tablets (10 mg total) by mouth daily. 09/05/14  Yes Shanker Kristeen Mans, MD  aspirin EC 81 MG tablet Take 81 mg by mouth daily.   Yes Historical Provider, MD  atorvastatin (LIPITOR) 40 MG tablet TAKE 1 TABLET BY MOUTH DAILY AT 6 PM 09/05/14  Yes Shanker Kristeen Mans, MD  calcium acetate (PHOSLO) 667 MG tablet Take 3 tablets by mouth 3 (three) times daily. 09/05/14  Yes Shanker Kristeen Mans, MD  carvedilol (COREG) 6.25 MG tablet Take 1 tablet (6.25 mg total) by mouth 2 (two) times daily with a meal. 09/05/14  Yes Shanker Kristeen Mans, MD  cinacalcet (SENSIPAR) 60 MG tablet Take 1 tablet (60 mg total) by mouth daily. 09/05/14  Yes Shanker Kristeen Mans, MD  clopidogrel (PLAVIX) 75 MG tablet Take 1 tablet (75 mg total) by mouth daily with breakfast. 09/05/14  Yes Shanker Kristeen Mans, MD  colchicine (COLCRYS) 0.6 MG tablet Take 0.5 tablets (0.3 mg total) by mouth 2 (two) times a week. On Monday and Thursday 09/05/14  Yes Shanker Kristeen Mans, MD  metoCLOPramide (REGLAN) 10 MG tablet Take 1 tablet (10 mg total) by mouth every 8 (eight) hours as needed for nausea or vomiting. 09/05/14  Yes Shanker Kristeen Mans, MD  multivitamin (RENA-VIT) TABS tablet Take 1 tablet by mouth daily. 09/05/14  Yes Shanker Kristeen Mans, MD   oxyCODONE (ROXICODONE) 5 MG immediate release tablet Take 1-2 tablets (5-10 mg total) by mouth every 6 (six) hours as needed for severe pain. 09/05/14  Yes Shanker Kristeen Mans, MD  pantoprazole (PROTONIX) 40 MG tablet Take 1 tablet (40 mg total) by mouth daily. 09/05/14  Yes Shanker Kristeen Mans, MD  polyethylene glycol (MIRALAX / GLYCOLAX) packet Take 17 g by mouth daily. 09/05/14  Yes Shanker Kristeen Mans, MD  predniSONE (DELTASONE) 5 MG tablet Take 1 tablet (5 mg total) by mouth daily. 09/05/14  Yes Shanker Kristeen Mans, MD  senna-docusate (SENOKOT-S) 8.6-50 MG per tablet Take 1 tablet by mouth 2 (two) times daily as needed for mild constipation. 09/05/14  Yes Shanker Kristeen Mans, MD  Nutritional Supplements (FEEDING SUPPLEMENT, NEPRO CARB STEADY,) LIQD Take 237 mLs by mouth as needed (missed meal during dialysis.). Patient not taking: Reported on 09/08/2014 09/05/14   Jonetta Osgood, MD  simethicone (MYLICON) 40 YQ/6.5HQ drops Take 0.6 mLs (40 mg total) by mouth 4 (four) times daily as needed for flatulence. Patient not taking: Reported on 09/08/2014 09/05/14   Jonetta Osgood, MD   BP 131/57 mmHg  Pulse 81  Temp(Src) 97.8 F (36.6 C) (Oral)  Resp 19  Ht 6' (1.829 m)  Wt 103 lb (46.72 kg)  BMI 13.97 kg/m2  SpO2 100% Physical Exam  Constitutional: He is oriented to person, place, and time. He appears well-developed. No distress.  Skinny, chronically ill appearing  HENT:  Head: Normocephalic and atraumatic.  Right Ear: External ear normal.  Left Ear: External ear normal.  Nose: Nose normal.  Mouth/Throat: Oropharynx is clear and moist.  Eyes: Conjunctivae and EOM are normal. Pupils are equal, round, and reactive to light. No scleral icterus.  Neck: Normal range of motion. Neck supple. No JVD present. No tracheal deviation present. No thyromegaly present.  Cardiovascular: Normal rate and intact distal pulses.  Exam reveals no gallop and no friction rub.   No murmur heard. Pulmonary/Chest:  Effort normal and breath sounds normal. No stridor. No respiratory distress. He has no wheezes. He has no rales.  Abdominal: Soft. Bowel sounds are normal. He exhibits no distension. There is no tenderness. There is no rebound and no guarding.  Musculoskeletal: Normal range of motion. He exhibits tenderness (legs tender to palpation bilaterally). He exhibits no edema.  Neurological: He  is alert and oriented to person, place, and time. No cranial nerve deficit. He exhibits normal muscle tone. Coordination normal.  Skin: Skin is warm and dry. No rash noted. He is not diaphoretic.  Psychiatric: He has a normal mood and affect. His behavior is normal.  Nursing note and vitals reviewed.   ED Course  Procedures (including critical care time) Labs Review Labs Reviewed  CBC WITH DIFFERENTIAL - Abnormal; Notable for the following:    Hemoglobin 12.3 (*)    RDW 16.6 (*)    Monocytes Relative 13 (*)    Monocytes Absolute 1.3 (*)    All other components within normal limits  COMPREHENSIVE METABOLIC PANEL - Abnormal; Notable for the following:    CO2 33 (*)    Creatinine, Ser 5.55 (*)    Albumin 2.2 (*)    Alkaline Phosphatase 166 (*)    GFR calc non Af Amer 10 (*)    GFR calc Af Amer 12 (*)    All other components within normal limits  PROTIME-INR  TYPE AND SCREEN    Imaging Review No results found.   EKG Interpretation None      MDM   Final diagnoses:  Lower GI bleed    Pt is a 53 y.o. M with recent ICU admission for GI bleed who presents due to multiple episodes of gross hematochezia. Patient has another grossly bloody BM in the ED with passage of large clot. Patient HDS, abdominal exam benign, and Hgb 12. Feel patient appropriate for floor admission for further workup per GI. Patient and family updated on this plan and express understanding and agreement. Patient admitted for further workup.  Patient seen with attending, Dr. Canary Brim, who oversaw clinical decision  making.     Margaretann Loveless, MD 09/09/14 2500  Threasa Beards, MD 09/14/14 [phone]

## 2014-09-08 NOTE — ED Notes (Signed)
Pt had two bowel movements during stay in ED, passed one moderate and one large sized clot.

## 2014-09-08 NOTE — ED Notes (Signed)
Pt remains monitored by blood pressure, pulse ox, and 12 lead. pts family remains at bedside.  

## 2014-09-08 NOTE — ED Notes (Signed)
Pt placed into gown and on monitor upon arrival to room. Pt monitored by blood pressure, pulse ox, and 12 lead.  

## 2014-09-09 DIAGNOSIS — I10 Essential (primary) hypertension: Secondary | ICD-10-CM

## 2014-09-09 DIAGNOSIS — I2510 Atherosclerotic heart disease of native coronary artery without angina pectoris: Secondary | ICD-10-CM

## 2014-09-09 DIAGNOSIS — Z992 Dependence on renal dialysis: Secondary | ICD-10-CM

## 2014-09-09 DIAGNOSIS — K922 Gastrointestinal hemorrhage, unspecified: Secondary | ICD-10-CM

## 2014-09-09 DIAGNOSIS — N186 End stage renal disease: Secondary | ICD-10-CM

## 2014-09-09 LAB — RENAL FUNCTION PANEL
Albumin: 1.8 g/dL — ABNORMAL LOW (ref 3.5–5.2)
Anion gap: 14 (ref 5–15)
BUN: 31 mg/dL — ABNORMAL HIGH (ref 6–23)
CO2: 29 mEq/L (ref 19–32)
Calcium: 7.7 mg/dL — ABNORMAL LOW (ref 8.4–10.5)
Chloride: 98 mEq/L (ref 96–112)
Creatinine, Ser: 7.03 mg/dL — ABNORMAL HIGH (ref 0.50–1.35)
GFR calc Af Amer: 9 mL/min — ABNORMAL LOW (ref >90)
GFR calc non Af Amer: 8 mL/min — ABNORMAL LOW (ref >90)
Glucose, Bld: 65 mg/dL — ABNORMAL LOW (ref 70–99)
Phosphorus: 4.9 mg/dL — ABNORMAL HIGH (ref 2.3–4.6)
Potassium: 3.9 mEq/L (ref 3.7–5.3)
Sodium: 141 mEq/L (ref 137–147)

## 2014-09-09 LAB — COMPREHENSIVE METABOLIC PANEL
ALT: 11 U/L (ref 0–53)
ANION GAP: 10 (ref 5–15)
AST: 25 U/L (ref 0–37)
Albumin: 1.8 g/dL — ABNORMAL LOW (ref 3.5–5.2)
Alkaline Phosphatase: 134 U/L — ABNORMAL HIGH (ref 39–117)
BUN: 25 mg/dL — AB (ref 6–23)
CALCIUM: 8 mg/dL — AB (ref 8.4–10.5)
CO2: 32 mEq/L (ref 19–32)
Chloride: 98 mEq/L (ref 96–112)
Creatinine, Ser: 6.21 mg/dL — ABNORMAL HIGH (ref 0.50–1.35)
GFR, EST AFRICAN AMERICAN: 11 mL/min — AB (ref >90)
GFR, EST NON AFRICAN AMERICAN: 9 mL/min — AB (ref >90)
GLUCOSE: 60 mg/dL — AB (ref 70–99)
Potassium: 4 mEq/L (ref 3.7–5.3)
Sodium: 140 mEq/L (ref 137–147)
Total Bilirubin: 0.8 mg/dL (ref 0.3–1.2)
Total Protein: 5.4 g/dL — ABNORMAL LOW (ref 6.0–8.3)

## 2014-09-09 LAB — CBC
HCT: 34 % — ABNORMAL LOW (ref 39.0–52.0)
HCT: 29.8 % — ABNORMAL LOW (ref 39.0–52.0)
Hemoglobin: 10.3 g/dL — ABNORMAL LOW (ref 13.0–17.0)
Hemoglobin: 9.1 g/dL — ABNORMAL LOW (ref 13.0–17.0)
MCH: 27.7 pg (ref 26.0–34.0)
MCH: 27.5 pg (ref 26.0–34.0)
MCHC: 30.3 g/dL (ref 30.0–36.0)
MCHC: 30.5 g/dL (ref 30.0–36.0)
MCV: 91.4 fL (ref 78.0–100.0)
MCV: 90 fL (ref 78.0–100.0)
PLATELETS: 177 10*3/uL (ref 150–400)
Platelets: 202 10*3/uL (ref 150–400)
RBC: 3.72 MIL/uL — AB (ref 4.22–5.81)
RBC: 3.31 MIL/uL — ABNORMAL LOW (ref 4.22–5.81)
RDW: 16.6 % — ABNORMAL HIGH (ref 11.5–15.5)
RDW: 16.4 % — ABNORMAL HIGH (ref 11.5–15.5)
WBC: 10.1 10*3/uL (ref 4.0–10.5)
WBC: 10.5 10*3/uL (ref 4.0–10.5)

## 2014-09-09 LAB — TYPE AND SCREEN
ABO/RH(D): B POS
Antibody Screen: POSITIVE
DAT, IgG: NEGATIVE

## 2014-09-09 LAB — PROTIME-INR
INR: 1.22 (ref 0.00–1.49)
PROTHROMBIN TIME: 15.5 s — AB (ref 11.6–15.2)

## 2014-09-09 MED ORDER — ACETAMINOPHEN 325 MG PO TABS
650.0000 mg | ORAL_TABLET | Freq: Four times a day (QID) | ORAL | Status: DC | PRN
  Administered 2014-09-15: 650 mg via ORAL
  Filled 2014-09-09: qty 2

## 2014-09-09 MED ORDER — SODIUM CHLORIDE 0.9 % IV SOLN: 100.0000 mL | INTRAVENOUS | Status: DC | PRN

## 2014-09-09 MED ORDER — AMLODIPINE BESYLATE 10 MG PO TABS
10.0000 mg | ORAL_TABLET | Freq: Every day | ORAL | Status: DC
  Administered 2014-09-09 – 2014-09-18 (×7): 10 mg via ORAL
  Filled 2014-09-09 (×12): qty 1

## 2014-09-09 MED ORDER — CALCITRIOL 0.5 MCG PO CAPS
2.7500 ug | ORAL_CAPSULE | ORAL | Status: DC
  Administered 2014-09-09 – 2014-09-21 (×4): 2.75 ug via ORAL
  Filled 2014-09-09 (×6): qty 1

## 2014-09-09 MED ORDER — ONDANSETRON HCL 4 MG PO TABS: 4.0000 mg | ORAL_TABLET | Freq: Four times a day (QID) | ORAL | Status: DC | PRN

## 2014-09-09 MED ORDER — OXYCODONE HCL 5 MG PO TABS
ORAL_TABLET | ORAL | Status: AC
  Filled 2014-09-09: qty 2

## 2014-09-09 MED ORDER — SODIUM CHLORIDE 0.9 % IJ SOLN
3.0000 mL | Freq: Two times a day (BID) | INTRAMUSCULAR | Status: DC
  Administered 2014-09-09 – 2014-09-21 (×22): 3 mL via INTRAVENOUS

## 2014-09-09 MED ORDER — METOCLOPRAMIDE HCL 10 MG PO TABS
10.0000 mg | ORAL_TABLET | Freq: Three times a day (TID) | ORAL | Status: DC | PRN
Start: 2014-09-09 — End: 2014-09-21
  Filled 2014-09-09: qty 1

## 2014-09-09 MED ORDER — ATORVASTATIN CALCIUM 40 MG PO TABS
40.0000 mg | ORAL_TABLET | Freq: Every day | ORAL | Status: DC
  Administered 2014-09-09 – 2014-09-20 (×12): 40 mg via ORAL
  Filled 2014-09-09 (×14): qty 1

## 2014-09-09 MED ORDER — CARVEDILOL 6.25 MG PO TABS
6.2500 mg | ORAL_TABLET | Freq: Two times a day (BID) | ORAL | Status: DC
  Administered 2014-09-09 – 2014-09-21 (×19): 6.25 mg via ORAL
  Filled 2014-09-09 (×29): qty 1

## 2014-09-09 MED ORDER — SODIUM CHLORIDE 0.9 % IV SOLN
125.0000 mg | INTRAVENOUS | Status: AC
  Administered 2014-09-16 – 2014-09-19 (×2): 125 mg via INTRAVENOUS
  Filled 2014-09-09 (×6): qty 10

## 2014-09-09 MED ORDER — CALCIUM ACETATE 667 MG PO CAPS
2001.0000 mg | ORAL_CAPSULE | Freq: Three times a day (TID) | ORAL | Status: DC
  Administered 2014-09-11 – 2014-09-20 (×14): 2001 mg via ORAL
  Filled 2014-09-09 (×38): qty 3

## 2014-09-09 MED ORDER — PREDNISONE 5 MG PO TABS
5.0000 mg | ORAL_TABLET | Freq: Every day | ORAL | Status: DC
  Administered 2014-09-09 – 2014-09-21 (×12): 5 mg via ORAL
  Filled 2014-09-09 (×13): qty 1

## 2014-09-09 MED ORDER — LIDOCAINE HCL (PF) 1 % IJ SOLN: 5.0000 mL | INTRAMUSCULAR | Status: DC | PRN

## 2014-09-09 MED ORDER — ONDANSETRON HCL 4 MG/2ML IJ SOLN: 4.0000 mg | Freq: Four times a day (QID) | INTRAMUSCULAR | Status: DC | PRN

## 2014-09-09 MED ORDER — LIDOCAINE-PRILOCAINE 2.5-2.5 % EX CREA
1.0000 "application " | TOPICAL_CREAM | CUTANEOUS | Status: DC | PRN
  Filled 2014-09-09: qty 5

## 2014-09-09 MED ORDER — NEPRO/CARBSTEADY PO LIQD
237.0000 mL | ORAL | Status: DC | PRN
  Filled 2014-09-09: qty 237

## 2014-09-09 MED ORDER — ACETAMINOPHEN 650 MG RE SUPP: 650.0000 mg | Freq: Four times a day (QID) | RECTAL | Status: DC | PRN

## 2014-09-09 MED ORDER — NEPRO/CARBSTEADY PO LIQD: 237.0000 mL | ORAL | Status: DC | PRN

## 2014-09-09 MED ORDER — SODIUM CHLORIDE 0.9 % IV SOLN: 62.5000 mg | INTRAVENOUS | Status: DC

## 2014-09-09 MED ORDER — PENTAFLUOROPROP-TETRAFLUOROETH EX AERO
1.0000 "application " | INHALATION_SPRAY | CUTANEOUS | Status: DC | PRN
  Administered 2014-09-09: 1 via TOPICAL

## 2014-09-09 MED ORDER — COLCHICINE 0.6 MG PO TABS
0.3000 mg | ORAL_TABLET | ORAL | Status: DC
  Administered 2014-09-11 – 2014-09-18 (×3): 0.3 mg via ORAL
  Filled 2014-09-09 (×4): qty 0.5

## 2014-09-09 MED ORDER — PENTAFLUOROPROP-TETRAFLUOROETH EX AERO
INHALATION_SPRAY | CUTANEOUS | Status: AC
  Administered 2014-09-09: 1 via TOPICAL
  Filled 2014-09-09: qty 103.5

## 2014-09-09 MED ORDER — ALTEPLASE 2 MG IJ SOLR
2.0000 mg | Freq: Once | INTRAMUSCULAR | Status: DC | PRN
  Filled 2014-09-09: qty 2

## 2014-09-09 MED ORDER — OXYCODONE HCL 5 MG PO TABS
5.0000 mg | ORAL_TABLET | Freq: Four times a day (QID) | ORAL | Status: DC | PRN
  Administered 2014-09-09 – 2014-09-10 (×4): 10 mg via ORAL
  Administered 2014-09-11: 5 mg via ORAL
  Administered 2014-09-11 – 2014-09-12 (×5): 10 mg via ORAL
  Filled 2014-09-09 (×9): qty 2

## 2014-09-09 MED ORDER — BOOST / RESOURCE BREEZE PO LIQD
1.0000 | Freq: Three times a day (TID) | ORAL | Status: DC
  Administered 2014-09-09 – 2014-09-17 (×5): 1 via ORAL

## 2014-09-09 MED ORDER — CINACALCET HCL 30 MG PO TABS
60.0000 mg | ORAL_TABLET | Freq: Every day | ORAL | Status: DC
  Administered 2014-09-10 – 2014-09-20 (×9): 60 mg via ORAL
  Filled 2014-09-09 (×16): qty 2

## 2014-09-09 MED ORDER — SODIUM CHLORIDE 0.9 % IV SOLN
INTRAVENOUS | Status: DC
  Administered 2014-09-09 – 2014-09-11 (×3): via INTRAVENOUS

## 2014-09-09 MED ORDER — PANTOPRAZOLE SODIUM 40 MG IV SOLR
40.0000 mg | Freq: Two times a day (BID) | INTRAVENOUS | Status: DC
  Administered 2014-09-09 – 2014-09-14 (×11): 40 mg via INTRAVENOUS
  Filled 2014-09-09 (×13): qty 40

## 2014-09-09 MED ORDER — DARBEPOETIN ALFA 60 MCG/0.3ML IJ SOSY
60.0000 ug | PREFILLED_SYRINGE | INTRAMUSCULAR | Status: DC
  Filled 2014-09-09: qty 0.3

## 2014-09-09 MED ORDER — HEPARIN SODIUM (PORCINE) 1000 UNIT/ML DIALYSIS: 1000.0000 [IU] | INTRAMUSCULAR | Status: DC | PRN

## 2014-09-09 MED ORDER — PEG 3350-KCL-NA BICARB-NACL 420 G PO SOLR
4000.0000 mL | Freq: Once | ORAL | Status: AC
  Administered 2014-09-09: 4000 mL via ORAL
  Filled 2014-09-09: qty 4000

## 2014-09-09 NOTE — Procedures (Signed)
I was present at this dialysis session, have reviewed the session itself and made  appropriate changes  [name] MD (pgr) [phone]    ([phone] 09/09/2014, 7:08 PM

## 2014-09-09 NOTE — H&P (Signed)
[facility] History and Physical  Patient: [name]  WEX:[id number]  DOB: [birth year]  DOS: the patient was seen and examined on 09/09/2014 PCP: Placido Sou, MD  Chief Complaint: GI bleed  HPI: [name] is a 53 y.o. male with Past medical history of ESRD on hemodialysis Tuesday Thursday Saturday, hypertension, renal transplant patient, on chronic prednisone, chronic systolic heart failure, GERD, recent admission for GI bleed. Patient presents with complaints of bleeding in the bowels. He mentions that he was at his baseline until 8 PM today he started having complaints of recurrent bloody bowels. Bowel movement have been reported as maroon to bright red blood. He does not have any abdominal pain does not have any nausea vomiting acid reflux chest pain dizziness lightheadedness. He mentions he is compliant with all his medication and there is no recent change in his medication.  The patient is coming from homed at his baseline ndependent for most of his ADL.  Review of Systems: as mentioned in the history of present illness.  A Comprehensive review of the other systems is negative.  Past Medical History  Diagnosis Date  . ESRD on hemodialysis     a. ESRD 2/2 HTN with renal transplant in 1987 (cadaveric) after short period of dialysis;  b. Transplant failed in 2004 and he went back on HD;  c. As of 10/15 getting HD via L thigh AVG on a TTS schedule at [facility] on [facility].  . Hypertension   . Hx of kidney transplant     a. 1987-> back on HD since 2004  . Gout tophi   . Chronic steroid use     a. Has severe gout. Did not tolerate Allopurinol. Do not taper per PCP   . Systolic CHF, chronic     2 D echo 04/2012 with EF of 45 %   . Malnutrition   . Hx SBO 04/2012    a. 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 F/U with Dr Donne Hazel. Started on TNA  . Anemia associated with chronic renal  failure   . Hepatitis C   . GERD (gastroesophageal reflux disease)   . H/O hiatal hernia   . Arthritis   . History of DVT (deep vein thrombosis)   . Peripheral vascular disease     a. 12/2013 PTA of L Pop;  b. 12/2013 PTA R pop, R DP;  c. 06/2014 L Pop CBA/DCB PTA.  . Prostate cancer   . CAD (coronary artery disease)     a. 12/2013 Cath/PCI: EF 45-50%, LM Ca2+, LAD 30-40p, 64m, D1/2/3 min irregs, LCX 50ost, 30-96m, RCA 70p, 95/30m (Rota->3.0x23 Xience distal, 3.0x23 Xience mid, 3.0x28 Xience prox).  . Chronic diastolic CHF (congestive heart failure)     a. 12/2013 Echo: EF 55-60%, mild LVH, nl wall motion, Gr 2 DD.  . Dry gangrene     a. L great toe   Past Surgical History  Procedure Laterality Date  . Laparotomy  04/14/2012    Procedure: EXPLORATORY LAPAROTOMY;  Surgeon: Joyice Faster. Cornett, MD;  Location: [place];  Service: General;  Laterality: N/A;  . Colon resection  04/14/2012  . Laparotomy  04/22/2012    Procedure: EXPLORATORY LAPAROTOMY;  Surgeon: Adin Hector, MD;  Location: [place];  Service: General;  Laterality: N/A;  lysis of adhesions, omentoplasty, repair small bowel  . Thrombectomy w/ embolectomy  04/27/2012    Procedure: THROMBECTOMY ARTERIOVENOUS GORE-TEX  GRAFT;  Surgeon: Angelia Mould, MD;  Location: [place];  Service: Vascular;  Laterality: Left;  Thrombectomy of left thigh arteriovenous gortex graft  . Prostectomy  2011  . Renal grafts    . Pelvic abcess drainage Right 6/14    removal drain s/p bowl resection 13  . Transanal excision of rectal mass N/A 03/30/2013    Procedure: EXCISION OF anal MASS;  Surgeon: Joyice Faster. Cornett, MD;  Location: [address];  Service: General;  Laterality: N/A;  Exam under anesthesia with excision anal verge mass  . Aortogram  08/15/2014    abd aortogram  . Femoral-popliteal bypass graft Right 08/18/2014    Procedure: BYPASS GRAFT FEMORAL-POPLITEAL ARTERY with Gortex Graft;  Surgeon: Serafina Mitchell, MD;  Location: [place];  Service: Vascular;   Laterality: Right;  . Shuntogram Left 09/29/2012    Procedure: SHUNTOGRAM;  Surgeon: Serafina Mitchell, MD;  Location: [facility] CATH LAB;  Service: Cardiovascular;  Laterality: Left;  . Abdominal aortagram N/A 01/03/2014    Procedure: ABDOMINAL Maxcine Ham;  Surgeon: Serafina Mitchell, MD;  Location: [facility] CATH LAB;  Service: Cardiovascular;  Laterality: N/A;  . Abdominal aortagram Right 01/11/2014    Procedure: ABDOMINAL AORTAGRAM;  Surgeon: Serafina Mitchell, MD;  Location: [facility] CATH LAB;  Service: Cardiovascular;  Laterality: Right;  . Left heart catheterization with coronary angiogram N/A 01/12/2014    Procedure: LEFT HEART CATHETERIZATION WITH CORONARY ANGIOGRAM;  Surgeon: Leonie Man, MD;  Location: [facility] CATH LAB;  Service: Cardiovascular;  Laterality: N/A;  . Percutaneous coronary rotoblator intervention (pci-r) N/A 01/13/2014    Procedure: PERCUTANEOUS CORONARY ROTOBLATOR INTERVENTION (PCI-R);  Surgeon: Burnell Blanks, MD;  Location: [facility] CATH LAB;  Service: Cardiovascular;  Laterality: N/A;  . Abdominal aortagram N/A 07/19/2014    Procedure: ABDOMINAL AORTAGRAM;  Surgeon: Serafina Mitchell, MD;  Location: [facility] CATH LAB;  Service: Cardiovascular;  Laterality: N/A;  . Shuntogram N/A 08/24/2014    Procedure: Earney Mallet;  Surgeon: Conrad Bingham, MD;  Location: [facility] CATH LAB;  Service: Cardiovascular;  Laterality: N/A;   Social History:  reports that he quit smoking about 41 years ago. His smoking use included Cigarettes. He has a 10 pack-year smoking history. He has never used smokeless tobacco. He reports that he does not drink alcohol or use illicit drugs.  Allergies  Allergen Reactions  . Allopurinol Other (See Comments)    REACTION: decreased platelets    Family History  Problem Relation Age of Onset  . Hypertension Father   . Pneumonia Brother   . Lung disease Brother     Prior to Admission medications   Medication Sig Start Date End Date Taking? Authorizing Provider  amLODipine (NORVASC) 5 MG  tablet Take 2 tablets (10 mg total) by mouth daily. 09/05/14  Yes Shanker Kristeen Mans, MD  aspirin EC 81 MG tablet Take 81 mg by mouth daily.   Yes Historical Provider, MD  atorvastatin (LIPITOR) 40 MG tablet TAKE 1 TABLET BY MOUTH DAILY AT 6 PM 09/05/14  Yes Shanker Kristeen Mans, MD  calcium acetate (PHOSLO) 667 MG tablet Take 3 tablets by mouth 3 (three) times daily. 09/05/14  Yes Shanker Kristeen Mans, MD  carvedilol (COREG) 6.25 MG tablet Take 1 tablet (6.25 mg total) by mouth 2 (two) times daily with a meal. 09/05/14  Yes Shanker Kristeen Mans, MD  cinacalcet (SENSIPAR) 60 MG tablet Take 1 tablet (60 mg total) by mouth daily. 09/05/14  Yes Shanker Kristeen Mans, MD  clopidogrel (PLAVIX) 75 MG tablet  Take 1 tablet (75 mg total) by mouth daily with breakfast. 09/05/14  Yes Shanker Kristeen Mans, MD  colchicine (COLCRYS) 0.6 MG tablet Take 0.5 tablets (0.3 mg total) by mouth 2 (two) times a week. On Monday and Thursday 09/05/14  Yes Shanker Kristeen Mans, MD  metoCLOPramide (REGLAN) 10 MG tablet Take 1 tablet (10 mg total) by mouth every 8 (eight) hours as needed for nausea or vomiting. 09/05/14  Yes Shanker Kristeen Mans, MD  multivitamin (RENA-VIT) TABS tablet Take 1 tablet by mouth daily. 09/05/14  Yes Shanker Kristeen Mans, MD  oxyCODONE (ROXICODONE) 5 MG immediate release tablet Take 1-2 tablets (5-10 mg total) by mouth every 6 (six) hours as needed for severe pain. 09/05/14  Yes Shanker Kristeen Mans, MD  pantoprazole (PROTONIX) 40 MG tablet Take 1 tablet (40 mg total) by mouth daily. 09/05/14  Yes Shanker Kristeen Mans, MD  polyethylene glycol (MIRALAX / GLYCOLAX) packet Take 17 g by mouth daily. 09/05/14  Yes Shanker Kristeen Mans, MD  predniSONE (DELTASONE) 5 MG tablet Take 1 tablet (5 mg total) by mouth daily. 09/05/14  Yes Shanker Kristeen Mans, MD  senna-docusate (SENOKOT-S) 8.6-50 MG per tablet Take 1 tablet by mouth 2 (two) times daily as needed for mild constipation. 09/05/14  Yes Shanker Kristeen Mans, MD  Nutritional Supplements  (FEEDING SUPPLEMENT, NEPRO CARB STEADY,) LIQD Take 237 mLs by mouth as needed (missed meal during dialysis.). Patient not taking: Reported on 09/08/2014 09/05/14   Jonetta Osgood, MD  simethicone (MYLICON) 40 SM/2.7MB drops Take 0.6 mLs (40 mg total) by mouth 4 (four) times daily as needed for flatulence. Patient not taking: Reported on 09/08/2014 09/05/14   Jonetta Osgood, MD    Physical Exam: Filed Vitals:   09/08/14 2345 09/09/14 0015 09/09/14 0113 09/09/14 0434  BP: 131/57 142/50 141/54 134/55  Pulse: 81 75 75 85  Temp:   98.4 F (36.9 C) 99 F (37.2 C)  TempSrc:   Oral Oral  Resp: 19 19 18 18   Height:      Weight:   44.197 kg (97 lb 7 oz)   SpO2: 100% 99% 95% 96%    General: Alert, Awake and Oriented to Time, Place and Person. Appear in mild distress Eyes: PERRL ENT: Oral Mucosa clear moist. Neck: no JVD Cardiovascular: S1 and S2 Present, no Murmur, Peripheral Pulses Present Respiratory: Bilateral Air entry equal and Decreased, Clear to Auscultation, noCrackles, no wheezes Abdomen: Bowel Sound present, Soft and no tender Skin: no Rash Extremities: no Pedal edema, no calf tenderness Neurologic: Grossly no focal neuro deficit.  Labs on Admission:  CBC:  Recent Labs Lab 09/04/14 0500 09/05/14 0700 09/08/14 2121  WBC 13.7* 11.4* 10.2  NEUTROABS  --   --  7.0  HGB 10.3* 10.3* 12.3*  HCT 32.5* 33.0* 40.0  MCV 88.6 88.9 91.7  PLT 139* 158 206    CMP     Component Value Date/Time   NA 141 09/08/2014 2121   K 4.0 09/08/2014 2121   CL 97 09/08/2014 2121   CO2 33* 09/08/2014 2121   GLUCOSE 85 09/08/2014 2121   BUN 20 09/08/2014 2121   CREATININE 5.55* 09/08/2014 2121   CREATININE 7.80* 01/16/2014 1437   CALCIUM 8.6 09/08/2014 2121   PROT 6.3 09/08/2014 2121   ALBUMIN 2.2* 09/08/2014 2121   AST 36 09/08/2014 2121   ALT 15 09/08/2014 2121   ALKPHOS 166* 09/08/2014 2121   BILITOT 1.1 09/08/2014 2121   GFRNONAA 10* 09/08/2014 2121   GFRAA  12* 09/08/2014  2121    No results for input(s): LIPASE, AMYLASE in the last 168 hours. No results for input(s): AMMONIA in the last 168 hours.  No results for input(s): CKTOTAL, CKMB, CKMBINDEX, TROPONINI in the last 168 hours. BNP (last 3 results) No results for input(s): PROBNP in the last 8760 hours.  Radiological Exams on Admission: No results found.   Assessment/Plan Principal Problem:   GI bleed Active Problems:   ESRD on hemodialysis   CAD (coronary artery disease), native coronary artery   Essential hypertension   1. GI bleed The patient is presenting with complaints of GI bleed. At present he is hemodynamically stable. Hemoglobin is also stable. He was recently admitted for the same but he refused to undergo colonoscopy and since his hemoglobin was stable and bleeding has stopped he was discharged home. Currently I would admit him in telemetry floor. We will consult with she and the morning. He will remain nothing by mouth overnight. We'll give him Protonix to 12 hours and Zofran as needed.  2. ESRD on hemodialysis with consulting nephro for hemodialysis.  3. Coronary artery disease. Holding his antiplatelets.  4. Hypertension. Blood pressure currently stable continue with current management.  Advance goals of care discussion: Full code   Consults: Gastroenterology  DVT Prophylaxis: mechanical compression device Nutrition: Nothing by mouth  Disposition: Admitted to inpatient in telemetry unit.  Author: Berle Mull, MD [facility] Pager: [phone] 09/09/2014,     If 7PM-7AM, please contact night-coverage [email or web address] Password [email or web address]

## 2014-09-09 NOTE — Progress Notes (Signed)
Physical Therapy Evaluation Patient Details Name: [name] MRN: [id number] DOB: 04-Mar-1961 Today's Date: 09/09/2014   History of Present Illness  Patient is a 53 yo male admitted 09/08/14 with recurrent GIB.  Was d/c from hospital on 09/05/14 with similar issue.  PMH:  severe PVD with multiple revascularization procedures and dry gangrene Rt foot, ESRD on HD, renal transplant, HTN, CHF, Hep C, anemia, CAD, PCI 01/13/14.  Clinical Impression  Patient presents with problems listed below.  Will benefit from acute PT to maximize independence prior to d/c home with sister.    Follow Up Recommendations Home health PT;Supervision - Intermittent [facility] Aide for ADL's)    Equipment Recommendations  None recommended by PT    Recommendations for Other Services       Precautions / Restrictions Precautions Precautions: Fall Required Braces or Orthoses: Other Brace/Splint Other Brace/Splint: DARCO shoe for R foot Restrictions Weight Bearing Restrictions: No      Mobility  Bed Mobility Overal bed mobility: Needs Assistance Bed Mobility: Supine to Sit;Sit to Supine     Supine to sit: Modified independent (Device/Increase time) Sit to supine: Modified independent (Device/Increase time)   General bed mobility comments: No physical assist required.  Used bed rail.  Patient able to sit EOB x 7 minutes with good balance.  Patient with increasing pain in LE's - returned to supine.   Transfers                 General transfer comment: Patient declined attempts at standing - Darco shoe at home.  Ambulation/Gait                Stairs            Wheelchair Mobility    Modified Rankin (Stroke Patients Only)       Balance                                             Pertinent Vitals/Pain Pain Assessment: 0-10 Pain Score: 7  Pain Location: RLE Pain Descriptors / Indicators: Aching;Sore Pain Intervention(s): Limited activity within patient's  tolerance;Repositioned    Home Living Family/patient expects to be discharged to:: Private residence Living Arrangements: Other relatives (Sister and nephew) Available Help at Discharge: Family;Available PRN/intermittently (Sister and nephew work) Type of Home: House Home Access: Stairs to enter Entrance Stairs-Rails: Can reach both Entrance Stairs-Number of Steps: 2 Home Layout: One level Home Equipment: Environmental consultant - 2 wheels;Cane - single point;Bedside commode;Shower seat      Prior Function Level of Independence: Independent with assistive device(s)         Comments: Use of RW for gait     Hand Dominance   Dominant Hand: Right    Extremity/Trunk Assessment   Upper Extremity Assessment: Generalized weakness           Lower Extremity Assessment: RLE deficits/detail;LLE deficits/detail RLE Deficits / Details: Strength grossly 3+/5 LLE Deficits / Details: Strength grossly 4-/5  Cervical / Trunk Assessment: Normal  Communication   Communication: No difficulties  Cognition Arousal/Alertness: Awake/alert Behavior During Therapy: WFL for tasks assessed/performed Overall Cognitive Status: Within Functional Limits for tasks assessed                      General Comments      Exercises        Assessment/Plan    PT  Assessment Patient needs continued PT services  PT Diagnosis Difficulty walking;Generalized weakness;Acute pain   PT Problem List Decreased strength;Decreased activity tolerance;Decreased mobility;Decreased knowledge of use of DME;Pain  PT Treatment Interventions DME instruction;Gait training;Stair training;Functional mobility training;Therapeutic activities;Therapeutic exercise;Patient/family education   PT Goals (Current goals can be found in the Care Plan section) Acute Rehab PT Goals Patient Stated Goal: To walk PT Goal Formulation: With patient/family Time For Goal Achievement: 09/16/14 Potential to Achieve Goals: Good    Frequency Min  3X/week   Barriers to discharge Inaccessible home environment;Decreased caregiver support Patient with difficulty negotiating stairs pta.   Patient's sister and nephew work so patient is alone during day.    Co-evaluation               End of Session   Activity Tolerance: Patient limited by pain Patient left: in bed;with call bell/phone within reach;with family/visitor present Nurse Communication: Mobility status         Time: 1421-1436 PT Time Calculation (min) (ACUTE ONLY): 15 min   Charges:   PT Evaluation $Initial PT Evaluation Tier I: 1 Procedure PT Treatments $Therapeutic Activity: 8-22 mins   PT G Codes:          Despina Pole 09/09/2014, 3:41 PM Carita Pian. Sanjuana Kava, [place] Pager [phone]

## 2014-09-09 NOTE — ED Notes (Signed)
Attempted report 

## 2014-09-09 NOTE — Progress Notes (Signed)
Progress Note for [facility].  Subjective: Recurrent bleeding.  See below.  Objective: Vital signs in last 24 hours: Temp:  [97.8 F (36.6 C)-99 F (37.2 C)] 99 F (37.2 C) (12/19 0434) Pulse Rate:  [75-94] 85 (12/19 0434) Resp:  [14-25] 18 (12/19 0434) BP: (131-161)/(50-92) 134/55 mmHg (12/19 0434) SpO2:  [95 %-100 %] 96 % (12/19 0434) Weight:  [44.197 kg (97 lb 7 oz)-46.72 kg (103 lb)] 44.197 kg (97 lb 7 oz) (12/19 0113)    Intake/Output from previous day:   Intake/Output this shift:    General appearance: alert and no distress GI: soft, non-tender; bowel sounds normal; no masses,  no organomegaly 1 cm ulcer at the tips of his penis  Lab Results:  Recent Labs  09/08/14 2121 09/09/14 0426  WBC 10.2 10.1  HGB 12.3* 10.3*  HCT 40.0 34.0*  PLT 206 177   BMET  Recent Labs  09/08/14 2121 09/09/14 0426  NA 141 140  K 4.0 4.0  CL 97 98  CO2 33* 32  GLUCOSE 85 60*  BUN 20 25*  CREATININE 5.55* 6.21*  CALCIUM 8.6 8.0*   LFT  Recent Labs  09/09/14 0426  PROT 5.4*  ALBUMIN 1.8*  AST 25  ALT 11  ALKPHOS 134*  BILITOT 0.8   PT/INR  Recent Labs  09/08/14 2121 09/09/14 0426  LABPROT 14.9 15.5*  INR 1.16 1.22   Hepatitis Panel No results for input(s): HEPBSAG, HCVAB, HEPAIGM, HEPBIGM in the last 72 hours. C-Diff No results for input(s): CDIFFTOX in the last 72 hours. Fecal Lactopherrin No results for input(s): FECLLACTOFRN in the last 72 hours.  Studies/Results: No results found.  Medications:  Scheduled: . amLODipine  10 mg Oral Daily  . atorvastatin  40 mg Oral q1800  . calcium acetate  2,001 mg Oral TID WC  . carvedilol  6.25 mg Oral BID WC  . cinacalcet  60 mg Oral Q breakfast  . pantoprazole (PROTONIX) IV  40 mg Intravenous Q12H  . predniSONE  5 mg Oral Daily  . sodium chloride  3 mL Intravenous Q12H   Continuous:   Assessment/Plan: 1) Recurrent hematochezia. 2) Anemia. 3) ESRD. 4) Ulcerated penis.   The patient refused to  prep for the colonoscopy, but he is now agreeable to the procedure.  He has a history of diverticula, but there was also thought of ischemic colitis.  At the time of discharged he did not have any further bleeding for several days.  Upon initial presentation he had a significant amount of bleeding that required ICU care.  HGB has dropped down again, but not to the low of 7.7 g/dL.  Plan: 1) Colonoscopy tomorrow.  2) Follow HGB and transfuse as necessary.  LOS: 1 day   Princess Karnes D 09/09/2014, 8:31 AM

## 2014-09-09 NOTE — Consult Note (Signed)
[facility] Renal Consultation Note    Indication for Consultation:  Management of ESRD/hemodialysis; anemia, hypertension/volume and secondary hyperparathyroidism PCP:  HPI: [name] is a 53 y.o. male with hx of ESRD secondary to HTN on TTS HD at [facility] who was just discharged 12/15 following a 3 week stay for PVD with right foot ischemia/gangrene s/p right fem pop 11/27 and lower GIB likely secondary to ischemic colitis was discharged with a hgb of 10.3.  He attended dialysis 12/2 with a low goal and minimal weight change.  Pre HD BP was 161/80 with post of 178/118 net UF was 0.5 L Review of treatment data 12/17 showed that his BP ran consistently between 160 - 170 with heart rate of 80 - 100 without any BP drops.  Labs drawn 12/17 at dialysis showed a Hgb 11.3 19% sat Fe 22.  He presented yeseterday to the ED with multiple episodes of gross hematochezia including one in the ED with clots. He denied SOB, CP, abdominal pain, dizziness, N or V.  He still has foot pain about the same as before.  He reports he had had nonbloody BM prior to recent hospital discharge and this was an abrupt onset yesterday.   Initial Hgb was initially 12.3 down to 10.3. There have been no bloodly stools today.  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 Had enteric fistula rx'd  with repositioning of tube. Then had open wound connecting w abcess cavity, rx'd w ostomy bags. Repeat CT showed resolution of abcess. IR failed attempt at CV line showed occlusion of R innominate vein; also esrd on HD, chronic low PLTs, gout, HTN, chronic steroids. PICC line placed and DC'd to SNF.  10/13 - fever, tachycardia > dx was CNSS bacteremia due to infected PICC line. PICC removed, 14 day of IV vanc. LLE swelling, dopp neg for DVT, left foot erythema. Hep C since 8/13. AFP normal and abd CT neg for [facility]. 12/2013 - bilat LE foot ulcers, ischemic > rx'd by [facility] with successful angioplasty and atherectomy of left popliteal artery on 01/03/14 and right popliteal artery/ dorsalis pedis on 01/11/14. Had CP during stay > cath showed 95% RCA lesion, underwent rotatblator / PTCA w DES x 3 to RCA lesions. DAP prescribed. dc'd home 10/28 - 07/23/2014 > bilat foot ulcers/ ischemia, hx perc interventions L leg in April > admitted and underwent bilat LE angiogram with successful cutting and drug-coating balloon angioplasty of recurrent left popliteal art stenosis on 07/19/14. Underwent successful drug coated balloon angioplasty of R popliteal restenosis on 08/02/14. Had CP post procedure. ESRD on HD 11/25 - 09/05/14 > ischemic R leg > admitted and underwent R fem-pop BPG.  Comp by LGIB w hematochezia and hemorraghic shock.  Then stabilized. Also had L thigh swelling resolved after venoplasty/stenting of post-AVG venous stenosis by VVS.  Seen by PT, deconditioned,  plan home with home PT.    Past Medical History  Diagnosis Date  . ESRD on hemodialysis     a. ESRD 2/2 HTN with renal transplant in 1987 (cadaveric) after short period of dialysis;  b. Transplant failed in 2004 and he went back on HD;  c. As of 10/15 getting HD via L thigh AVG on a TTS schedule at [facility]  . Hypertension   . Hx of kidney transplant     a. 1987-> back on HD since 2004  . Gout tophi   . Chronic steroid use     a.  Has severe gout. Did not tolerate Allopurinol. Do not taper per PCP   . Systolic CHF, chronic     2 D echo 04/2012 with EF of 45 %   . Malnutrition   . Hx SBO 04/2012    a. 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 F/U with Dr Donne Hazel. Started on TNA  . Anemia associated with chronic renal failure   . Hepatitis C   . GERD (gastroesophageal reflux disease)   . H/O hiatal hernia   . Arthritis   . History of DVT (deep vein thrombosis)   . Peripheral vascular disease     a. 12/2013 PTA of L Pop;  b. 12/2013 PTA R pop, R DP;  c. 06/2014 L Pop CBA/DCB PTA.  . Prostate cancer   . CAD (coronary artery disease)     a. 12/2013 Cath/PCI: EF 45-50%, LM Ca2+, LAD 30-40p, 56m, D1/2/3 min irregs, LCX 50ost, 30-81m, RCA 70p, 95/39m (Rota->3.0x23 Xience distal, 3.0x23 Xience mid, 3.0x28 Xience prox).  . Chronic diastolic CHF (congestive heart failure)     a. 12/2013 Echo: EF 55-60%, mild LVH, nl wall motion, Gr 2 DD.  . Dry gangrene     a. L great toe   Past Surgical History  Procedure Laterality Date  . Laparotomy  04/14/2012    Procedure: EXPLORATORY LAPAROTOMY;  Surgeon: Joyice Faster. Cornett, MD;  Location: [place];  Service: General;  Laterality: N/A;  . Colon resection  04/14/2012  . Laparotomy  04/22/2012    Procedure: EXPLORATORY LAPAROTOMY;  Surgeon: Adin Hector, MD;  Location: [place];  Service: General;  Laterality: N/A;  lysis of adhesions, omentoplasty, repair small bowel  . Thrombectomy w/ embolectomy  04/27/2012    Procedure: THROMBECTOMY ARTERIOVENOUS GORE-TEX GRAFT;  Surgeon: Angelia Mould, MD;  Location: [place];  Service: Vascular;  Laterality: Left;  Thrombectomy of left thigh arteriovenous gortex graft  . Prostectomy  2011  . Renal grafts    . Pelvic abcess drainage Right 6/14    removal drain s/p bowl resection 13  . Transanal excision of rectal mass N/A 03/30/2013    Procedure: EXCISION OF anal MASS;  Surgeon:  Joyice Faster. Cornett, MD;  Location: [place];  Service: General;  Laterality: N/A;  Exam under anesthesia with excision anal verge mass  . Aortogram  08/15/2014    abd aortogram  . Femoral-popliteal bypass graft Right 08/18/2014    Procedure: BYPASS GRAFT FEMORAL-POPLITEAL ARTERY with Gortex Graft;  Surgeon: Serafina Mitchell, MD;  Location: [place];  Service: Vascular;  Laterality: Right;  . Shuntogram Left 09/29/2012    Procedure: SHUNTOGRAM;  Surgeon: Serafina Mitchell, MD;  Location: [facility] CATH LAB;  Service: Cardiovascular;  Laterality: Left;  . Abdominal aortagram N/A 01/03/2014    Procedure: ABDOMINAL AORTAGRAM;  Surgeon:  Serafina Mitchell, MD;  Location: [facility] CATH LAB;  Service: Cardiovascular;  Laterality: N/A;  . Abdominal aortagram Right 01/11/2014    Procedure: ABDOMINAL AORTAGRAM;  Surgeon: Serafina Mitchell, MD;  Location: [facility] CATH LAB;  Service: Cardiovascular;  Laterality: Right;  . Left heart catheterization with coronary angiogram N/A 01/12/2014    Procedure: LEFT HEART CATHETERIZATION WITH CORONARY ANGIOGRAM;  Surgeon: Leonie Man, MD;  Location: [facility] CATH LAB;  Service: Cardiovascular;  Laterality: N/A;  . Percutaneous coronary rotoblator intervention (pci-r) N/A 01/13/2014    Procedure: PERCUTANEOUS CORONARY ROTOBLATOR INTERVENTION (PCI-R);  Surgeon: Burnell Blanks, MD;  Location: [facility] CATH LAB;  Service: Cardiovascular;  Laterality: N/A;  . Abdominal aortagram N/A 07/19/2014    Procedure: ABDOMINAL AORTAGRAM;  Surgeon: Serafina Mitchell, MD;  Location: [facility] CATH LAB;  Service: Cardiovascular;  Laterality: N/A;  . Shuntogram N/A 08/24/2014    Procedure: Earney Mallet;  Surgeon: Conrad , MD;  Location: [facility] CATH LAB;  Service: Cardiovascular;  Laterality: N/A;   Family History  Problem Relation Age of Onset  . Hypertension Father   . Pneumonia Brother   . Lung disease Brother    Social History:  reports that he quit smoking about 41 years ago. His smoking use included Cigarettes. He has a 10  pack-year smoking history. He has never used smokeless tobacco. He reports that he does not drink alcohol or use illicit drugs. Allergies  Allergen Reactions  . Allopurinol Other (See Comments)    REACTION: decreased platelets   Prior to Admission medications   Medication Sig Start Date End Date Taking? Authorizing Provider  amLODipine (NORVASC) 5 MG tablet Take 2 tablets (10 mg total) by mouth daily. 09/05/14  Yes Shanker Kristeen Mans, MD  aspirin EC 81 MG tablet Take 81 mg by mouth daily.   Yes Historical Provider, MD  atorvastatin (LIPITOR) 40 MG tablet TAKE 1 TABLET BY MOUTH DAILY AT 6 PM 09/05/14  Yes Shanker Kristeen Mans, MD  calcium acetate (PHOSLO) 667 MG tablet Take 3 tablets by mouth 3 (three) times daily. 09/05/14  Yes Shanker Kristeen Mans, MD  carvedilol (COREG) 6.25 MG tablet Take 1 tablet (6.25 mg total) by mouth 2 (two) times daily with a meal. 09/05/14  Yes Shanker Kristeen Mans, MD  cinacalcet (SENSIPAR) 60 MG tablet Take 1 tablet (60 mg total) by mouth daily. 09/05/14  Yes Shanker Kristeen Mans, MD  clopidogrel (PLAVIX) 75 MG tablet Take 1 tablet (75 mg total) by mouth daily with breakfast. 09/05/14  Yes Shanker Kristeen Mans, MD  colchicine (COLCRYS) 0.6 MG tablet Take 0.5 tablets (0.3 mg total) by mouth 2 (two) times a week. On Monday and Thursday 09/05/14  Yes Shanker Kristeen Mans, MD  metoCLOPramide (REGLAN) 10 MG tablet Take 1 tablet (10 mg total) by mouth every 8 (eight) hours as needed for nausea or vomiting. 09/05/14  Yes Shanker Kristeen Mans, MD  multivitamin (RENA-VIT) TABS tablet Take 1 tablet by mouth daily. 09/05/14  Yes Shanker Kristeen Mans, MD  oxyCODONE (ROXICODONE) 5 MG immediate release tablet Take 1-2 tablets (5-10 mg total) by mouth every 6 (six) hours as needed for severe pain. 09/05/14  Yes Shanker Kristeen Mans, MD  pantoprazole (PROTONIX) 40 MG tablet Take 1 tablet (40 mg total) by mouth daily. 09/05/14  Yes Shanker Kristeen Mans, MD  polyethylene glycol (MIRALAX / GLYCOLAX) packet Take 17 g  by mouth daily. 09/05/14  Yes Shanker Kristeen Mans, MD  predniSONE (DELTASONE) 5 MG tablet Take 1 tablet (5 mg  total) by mouth daily. 09/05/14  Yes Shanker Kristeen Mans, MD  senna-docusate (SENOKOT-S) 8.6-50 MG per tablet Take 1 tablet by mouth 2 (two) times daily as needed for mild constipation. 09/05/14  Yes Shanker Kristeen Mans, MD  Nutritional Supplements (FEEDING SUPPLEMENT, NEPRO CARB STEADY,) LIQD Take 237 mLs by mouth as needed (missed meal during dialysis.). Patient not taking: Reported on 09/08/2014 09/05/14   Jonetta Osgood, MD  simethicone (MYLICON) 40 FX/5.8IT drops Take 0.6 mLs (40 mg total) by mouth 4 (four) times daily as needed for flatulence. Patient not taking: Reported on 09/08/2014 09/05/14   Jonetta Osgood, MD   Current Facility-Administered Medications  Medication Dose Route Frequency Provider Last Rate Last Dose  . 0.9 %  sodium chloride infusion   Intravenous Continuous Beryle Beams, MD 20 mL/hr at 09/09/14 1004    . acetaminophen (TYLENOL) suppository 650 mg  650 mg Rectal Q6H PRN Berle Mull, MD      . acetaminophen (TYLENOL) tablet 650 mg  650 mg Oral Q6H PRN Berle Mull, MD      . amLODipine (NORVASC) tablet 10 mg  10 mg Oral Daily Berle Mull, MD      . atorvastatin (LIPITOR) tablet 40 mg  40 mg Oral q1800 Berle Mull, MD      . calcitRIOL (ROCALTROL) capsule 2.75 mcg  2.75 mcg Oral Q T,Th,Sa-HD [name], PA-C      . calcium acetate (PHOSLO) capsule 2,001 mg  2,001 mg Oral TID WC Berle Mull, MD   2,001 mg at 09/09/14 0800  . carvedilol (COREG) tablet 6.25 mg  6.25 mg Oral BID WC Berle Mull, MD   6.25 mg at 09/09/14 0800  . cinacalcet (SENSIPAR) tablet 60 mg  60 mg Oral Q breakfast Berle Mull, MD      . Derrill Memo ON 09/14/2014] Darbepoetin Alfa (ARANESP) injection 60 mcg  60 mcg Intravenous Q Thu-HD [name], PA-C      . feeding supplement (RESOURCE BREEZE) (RESOURCE BREEZE) liquid 1 Container  1 Container Oral TID BM Jonetta Osgood, MD   1  Container at 09/09/14 1000  . [START ON 09/14/2014] ferric gluconate (NULECIT) 62.5 mg in sodium chloride 0.9 % 100 mL IVPB  62.5 mg Intravenous Q Thu-HD [name], PA-C      . metoCLOPramide (REGLAN) tablet 10 mg  10 mg Oral Q8H PRN Berle Mull, MD      . ondansetron (ZOFRAN) tablet 4 mg  4 mg Oral Q6H PRN Berle Mull, MD       Or  . ondansetron (ZOFRAN) injection 4 mg  4 mg Intravenous Q6H PRN Berle Mull, MD      . oxyCODONE (Oxy IR/ROXICODONE) immediate release tablet 5-10 mg  5-10 mg Oral Q6H PRN Berle Mull, MD   10 mg at 09/09/14 0858  . pantoprazole (PROTONIX) injection 40 mg  40 mg Intravenous Q12H Berle Mull, MD   40 mg at 09/09/14 1005  . polyethylene glycol-electrolytes (NuLYTELY/GoLYTELY) solution 4,000 mL  4,000 mL Oral Once Beryle Beams, MD      . predniSONE (DELTASONE) tablet 5 mg  5 mg Oral Daily Berle Mull, MD      . sodium chloride 0.9 % injection 3 mL  3 mL Intravenous Q12H Berle Mull, MD   3 mL at 09/09/14 1005   Facility-Administered Medications Ordered in Other Encounters  Medication Dose Route Frequency Provider Last Rate Last Dose  . fentaNYL (SUBLIMAZE) injection   Intravenous PRN Quita Skye  Kem Boroughs, MD   25 mcg at 05/05/12 1544  . midazolam (VERSED) 5 MG/5ML injection   Intravenous PRN Carylon Perches, MD   2 mg at 05/05/12 1544   Labs: Basic Metabolic Panel:  Recent Labs Lab 09/04/14 0830 09/05/14 0700 09/08/14 2121 09/09/14 0426  NA 137 140 141 140  K 4.7 4.0 4.0 4.0  CL 99 100 97 98  CO2 23 27 33* 32  GLUCOSE 99 78 85 60*  BUN 36* 18 20 25*  CREATININE 7.55* 5.35* 5.55* 6.21*  CALCIUM 7.3* 7.3* 8.6 8.0*  PHOS 3.7 2.8  --   --    Liver Function Tests:  Recent Labs Lab 09/05/14 0700 09/08/14 2121 09/09/14 0426  AST  --  36 25  ALT  --  15 11  ALKPHOS  --  166* 134*  BILITOT  --  1.1 0.8  PROT  --  6.3 5.4*  ALBUMIN 1.8* 2.2* 1.8*   CBC:  Recent Labs Lab 09/04/14 0500 09/05/14 0700 09/08/14 2121 09/09/14 0426  WBC  13.7* 11.4* 10.2 10.1  NEUTROABS  --   --  7.0  --   HGB 10.3* 10.3* 12.3* 10.3*  HCT 32.5* 33.0* 40.0 34.0*  MCV 88.6 88.9 91.7 91.4  PLT 139* 158 206 177  CBG:  Recent Labs Lab 09/04/14 1705 09/05/14 0018 09/05/14 0604 09/05/14 1212 09/05/14 1604  GLUCAP 118* 117* 78 75 82   ROS: Pertinent positives and negatives per HPI   Physical Exam: Filed Vitals:   09/09/14 0015 09/09/14 0113 09/09/14 0434 09/09/14 0900  BP: 142/50 141/54 134/55 147/58  Pulse: 75 75 85 88  Temp:  98.4 F (36.9 C) 99 F (37.2 C) 98.6 F (37 C)  TempSrc:  Oral Oral Oral  Resp: 19 18 18 21   Height:      Weight:  44.197 kg (97 lb 7 oz)    SpO2: 99% 95% 96% 98%     General: Emaciated AAM in no acute distress. Head: Normocephalic, atraumatic, sclera non-icteric, mucus membranes are moist Neck: Supple. JVD not elevated./prominent collaterals across chest Lungs: Clear bilaterally to auscultation without wheezes, rales, or rhonchi. Breathing is unlabored. Heart: RRR with S1 S2. No murmurs, rubs, or gallops appreciated. Abdomen: Soft, non-tender, non-distended with normoactive bowel sounds. M-S:  diminished muscle mass with marked atrophy for age. Lower extremities:  no LE edema with dry gangrene 1-3 right toes and left great toes - foul odor Neuro: Alert and oriented X 3. Moves all extremities spontaneously. Psych:  Responds to questions appropriately w/flat affect. Dialysis Access: left thigh AVGG + bruit  Dialysis Orders: TTS @ [facility] 180 3:30 46.5 kg 2K/2Ca 400/800 Profile 2 Heparin none L thigh AVG  Calcitriol 2.75 mcg Aranesp 60 mcg on Thurs Venofer 50 per week Recent labs from 12/17 Hgb 11.3 Fe 22,  27% sat K 3.9 Ca 9.4 P 3 Alb 2.9 iPTH 217 WBC 11.56  Assessment/Plan: 1. Recurrent GIB; had last admission thought to have been secondary to ischemic colitis;  again - possibly ischemic - severe vascular disease - no noted BP drops during HD; keep systolic BP  >616; GI planning colonoscopy tomorrow 2. ESRD -  TTS - HD today - repeat BMP 3. Hypertension/volume  - BP stable though a little lower than at outpt HD - keep ? 130 on HD avoid ischemia; on norvasc 10/carvedilol6.25 bid 4. Anemia  - slight decline in Hgb; not enough for transfusion - ^ Fe (125 x 4)  ESA per routine-  since last Hgb was 4 am will repeat before HD today to see if trending down, though no stools since yesterday 5. Metabolic bone disease - controlled with current calcitiriol/ sensipar/binders dose 6. Protein calorie malnutrition - add Resource supplements to CL  7. Severe PVD s/o recent bilateral fem pops in Oct and Nov with nonhealing foot wounds; follow conservatively 8. Hep C + 9. Chronic steroid use - for gout  [name], PA-C [place] [phone] 09/09/2014, 11:58 AM   Pt seen, examined and agree w A/P as above.  [name] MD pager [phone]    cell [phone] 09/09/2014, 7:02 PM

## 2014-09-09 NOTE — Progress Notes (Signed)
Patient arrived in room [id number] from via stretcher. He is alert and oriented Patient oriented to room and surroundings. Vital signs stable.

## 2014-09-09 NOTE — Progress Notes (Addendum)
PATIENT DETAILS Name: [name] Age: 53 y.o. Sex: male Date of Birth: 10/26/1960 Admit Date: 09/08/2014 Admitting Physician Berle Mull, MD WIO:[id number],LAGTX L, MD  Brief narrative: 53 year old male patient with known vascular disease, chronic kidney disease on dialysis, hypertension, chronic steroids for gout brought to the emergency room on 08/16/14 with severe right leg pain, evaluated by vascular surgery, and underwent right femoral to below the knee popliteal artery bypass graft on 11/27. Subsequently patient developed significant left leg swelling, with concerns for left venous stenosis related to his left thigh arteriovenous graft, on 12/3 patient underwent left thigh shuntogram with subsequent venoplasty and stenting of the left common femoral vein.Previous left foot swelling improved remarkably after this procedure. On 12/5 patient developed significant hematochezia, unfortunately developed hemorrhagic shock and required ICU transfer. He received a total of 11 units of PRBC, 4 units of FFP and temporarily required pressors to maintain blood pressure. Aspirin/Plavix were placed on hold. He slowly stabilized, hematochezia completely resolved, on 12/11 he started having brown bowel movements. GI planNED to do colonoscopy, however patient refused this bleeding had resolved.  Aspirin and Plavix were subsequently resumed, and patient was discharged home on 09/05/14. Unfortunately on 09/08/14, he had numerous recurrent hematochezia and presented back to the emergency room and was subsequently admitted.  Assessment/Plan: Principal Problem:   Lower GI bleed: Suspect this is secondary to diverticular disease. 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 GI consulted, plans are for colonoscopy.  Active Problems:   Mild acute blood loss anemia: Secondary to above, although hemoglobin was 12.3 on  admission, hemoglobin today is close to his recent baseline from last admission. Monitor CBC, transfuse as needed.      ESRD on hemodialysis: Nephrology consulted for dialysis. Regular schedule is TTS    History of CAD with prior drug-eluting stent April 2015:no acute cardiac issues, given recurrent hematochezia aspirin/Plavix currently on hold. Will need to discuss with GI/await results of colonoscopy prior to resuming.     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    History of recent left femoral vein occlusion: Underwent venoplasty of left common femoral vein during his last admission, no swelling evident in his left leg.    History of hypertension: Currently controlled, cautiously continue with Coreg and amlodipine. If significant hematochezia results then we'll need to stop antihypertensives.    History of gout: Continue with chronic prednisone and colchicine    Deconditioning/generalized weakness: Secondary to recent prolonged hospitalization/acute illness-PT evaluation.    Severe protein calorie malnutrition: Continue with supplements  Disposition: Remain inpatient  Antibiotics:  None   Anti-infectives    None      DVT Prophylaxis: SCD's  Code Status: Full code   Family Communication None at bedside  Procedures:  None  CONSULTS:  nephrology  Time spent 40 minutes-which includes 50% of the time with face-to-face with patient/ family and coordinating care related to the above assessment and plan.  MEDICATIONS: Scheduled Meds: . amLODipine  10 mg Oral Daily  . atorvastatin  40 mg Oral q1800  . calcitRIOL  2.75 mcg Oral Q T,Th,Sa-HD  . calcium acetate  2,001 mg Oral TID WC  . carvedilol  6.25 mg Oral BID WC  . cinacalcet  60 mg Oral Q breakfast  . [START ON 09/14/2014] darbepoetin (ARANESP)  injection - DIALYSIS  60 mcg Intravenous Q Thu-HD  .  feeding supplement (RESOURCE BREEZE)  1 Container Oral TID BM  . [START ON 09/14/2014] ferric gluconate (FERRLECIT/NULECIT) IV  62.5 mg Intravenous Q Thu-HD  . pantoprazole (PROTONIX) IV  40 mg Intravenous Q12H  . polyethylene glycol-electrolytes  4,000 mL Oral Once  . predniSONE  5 mg Oral Daily  . sodium chloride  3 mL Intravenous Q12H   Continuous Infusions: . sodium chloride 20 mL/hr at 09/09/14 1004   PRN Meds:.acetaminophen, acetaminophen, metoCLOPramide, ondansetron **OR** ondansetron (ZOFRAN) IV, oxyCODONE    PHYSICAL EXAM: Vital signs in last 24 hours: Filed Vitals:   09/09/14 0015 09/09/14 0113 09/09/14 0434 09/09/14 0900  BP: 142/50 141/54 134/55 147/58  Pulse: 75 75 85 88  Temp:  98.4 F (36.9 C) 99 F (37.2 C) 98.6 F (37 C)  TempSrc:  Oral Oral Oral  Resp: 19 18 18 21   Height:      Weight:  44.197 kg (97 lb 7 oz)    SpO2: 99% 95% 96% 98%    Weight change:  Filed Weights   09/08/14 2051 09/09/14 0113  Weight: 46.72 kg (103 lb) 44.197 kg (97 lb 7 oz)   Body mass index is 13.21 kg/(m^2).   Gen Exam: Awake and alert with clear speech.   Neck: Supple, No JVD.   Chest: B/L Clear.   CVS: S1 S2 Regular, no murmurs.  Abdomen: soft, BS +, non tender, non distended.  Extremities: no edema, lower extremities warm to touch. Neurologic: Non Focal.   Skin: No Rash.   Wounds: N/A.    Intake/Output from previous day:  Intake/Output Summary (Last 24 hours) at 09/09/14 1214 Last data filed at 09/09/14 0439  Gross per 24 hour  Intake      0 ml  Output      0 ml  Net      0 ml     LAB RESULTS: CBC  Recent Labs Lab 09/04/14 0500 09/05/14 0700 09/08/14 2121 09/09/14 0426  WBC 13.7* 11.4* 10.2 10.1  HGB 10.3* 10.3* 12.3* 10.3*  HCT 32.5* 33.0* 40.0 34.0*  PLT 139* 158 206 177  MCV 88.6 88.9 91.7 91.4  MCH 28.1 27.8 28.2 27.7  MCHC 31.7 31.2 30.8 30.3  RDW 16.8* 16.5* 16.6* 16.6*  LYMPHSABS  --   --  1.5  --   MONOABS  --   --  1.3*  --   EOSABS  --    --  0.3  --   BASOSABS  --   --  0.0  --     Chemistries   Recent Labs Lab 09/04/14 0830 09/05/14 0700 09/08/14 2121 09/09/14 0426  NA 137 140 141 140  K 4.7 4.0 4.0 4.0  CL 99 100 97 98  CO2 23 27 33* 32  GLUCOSE 99 78 85 60*  BUN 36* 18 20 25*  CREATININE 7.55* 5.35* 5.55* 6.21*  CALCIUM 7.3* 7.3* 8.6 8.0*    CBG:  Recent Labs Lab 09/04/14 1705 09/05/14 0018 09/05/14 0604 09/05/14 1212 09/05/14 1604  GLUCAP 118* 117* 78 75 82    GFR Estimated Creatinine Clearance: 8.4 mL/min (by C-G formula based on Cr of 6.21).  Coagulation profile  Recent Labs Lab 09/08/14 2121 09/09/14 0426  INR 1.16 1.22    Cardiac Enzymes No results for input(s): CKMB, TROPONINI, MYOGLOBIN in the last 168 hours.  Invalid input(s): CK  Invalid input(s): POCBNP No results for input(s): DDIMER in the last  72 hours. No results for input(s): HGBA1C in the last 72 hours. No results for input(s): CHOL, HDL, LDLCALC, TRIG, CHOLHDL, LDLDIRECT in the last 72 hours. No results for input(s): TSH, T4TOTAL, T3FREE, THYROIDAB in the last 72 hours.  Invalid input(s): FREET3 No results for input(s): VITAMINB12, FOLATE, FERRITIN, TIBC, IRON, RETICCTPCT in the last 72 hours. No results for input(s): LIPASE, AMYLASE in the last 72 hours.  Urine Studies No results for input(s): UHGB, CRYS in the last 72 hours.  Invalid input(s): UACOL, UAPR, USPG, UPH, UTP, UGL, UKET, UBIL, UNIT, UROB, ULEU, UEPI, UWBC, URBC, UBAC, CAST, UCOM, BILUA  MICROBIOLOGY: No results found for this or any previous visit (from the past 240 hour(s)).  RADIOLOGY STUDIES/RESULTS: Ct Abdomen Pelvis W Contrast  08/27/2014   CLINICAL DATA:  Acute onset of bright red blood per rectum, with periumbilical cramping abdominal pain. Initial encounter.  EXAM: CT ABDOMEN AND PELVIS WITH CONTRAST  TECHNIQUE: Multidetector CT imaging of the abdomen and pelvis was performed using the standard protocol following bolus administration of  intravenous contrast.  CONTRAST:  124mL OMNIPAQUE IOHEXOL 300 MG/ML  SOLN  COMPARISON:  CT of the abdomen and pelvis from 11/10/2012  FINDINGS: Trace bilateral pleural effusions are seen, with bibasilar atelectasis.  A nonspecific 6 mm hypodensity within the left hepatic lobe is of uncertain significance. The liver is otherwise unremarkable. The spleen is mildly bulky, but remains normal in length. There is persistent chronic gallbladder wall thickening. The gallbladder is difficult to fully assess given trace ascites tracking about the liver.  The pancreas and adrenal glands are unremarkable.  The patient's native kidneys are markedly atrophic, with scattered bilateral renal cysts of varying size and attenuation. Scattered associated calcifications are seen. There is no evidence of hydronephrosis. No obstructing renal stones are seen.  There is marked fatty infiltration with regard to the transplant kidney at the left iliac fossa, with marked thinning of the renal parenchyma. Diffuse associated vascular calcifications are seen. This appearance is stable from 2014.  There is mild diffuse mesenteric and omental edema, nonspecific in appearance.  The stomach is within normal limits. No acute vascular abnormalities are seen. Diffuse calcification is seen along the abdominal aorta and its branches. Extensive diffuse vascular calcification is noted throughout the abdomen and pelvis.  The patient appears to be status post resection of much of the bowel. Remaining small bowel is grossly unremarkable. A distended anastomosis is noted at the right hemipelvis; this corresponds to the site of the prior right pelvic catheter, which has been removed.  The appendix is not definitely characterized; there is no evidence for appendicitis. Contrast progresses to the level of the proximal sigmoid colon. There is scattered diverticulosis along the transverse colon. Stool is noted filling the distal sigmoid colon and rectum.  There is  soft tissue inflammation about the distal sigmoid colon and rectum, raising question for proctitis. Diffuse presacral stranding is also noted, new from the prior study.  The bladder is not well assessed. Soft tissue density about the expected location of the bladder is relatively stable in appearance. 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 This is perhaps slightly more prominent than on the prior study. No inguinal lymphadenopathy is seen.  No acute osseous abnormalities are identified.  IMPRESSION: 1. Soft tissue inflammation about the distal sigmoid colon and rectum, with diffuse presacral stranding, new from the prior study. This raises concern for proctitis, which may explain  the patient's bright red blood per rectum. Stool noted filling the distal sigmoid colon and rectum. 2. Scattered diverticulosis along the transverse colon, without evidence of diverticulitis; the colon is otherwise grossly unremarkable. 3. Trace bilateral pleural effusions, with bibasilar atelectasis. 4. Marked atrophy of the patient's bilateral native kidneys, with bilateral renal cysts and scattered calcifications. Marked fatty infiltration of the transplant kidney at the left iliac fossa, with marked thinning of the renal parenchyma. This appearance is stable from 2014. 5. Stable appearance to soft tissue density about the expected location of the bladder. 6. 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 7. Diffuse calcification along the abdominal aorta and its branches. Extensive diffuse vascular calcification seen throughout the abdomen and pelvis. 8. Nonspecific 6 mm hypodensity within the left hepatic lobe, new from prior studies. 9. Persistent chronic gallbladder wall thickening; gallbladder difficult to fully assess  given trace ascites about the liver, but this appearance is relatively stable. 10. Mild nonspecific diffuse mesenteric and omental edema.   Electronically Signed   By: Garald Balding M.D.   On: 08/27/2014 01:30   Dg Chest Port 1 View  08/29/2014   CLINICAL DATA:  Central line placement.  EXAM: PORTABLE CHEST - 1 VIEW  COMPARISON:  07/03/2012  FINDINGS: Interval placement of a left central venous catheter. Tip projects to the left mediastinum over the aortic arch. This could be in the brachiocephalic vein, in a normal variant left superior vena cava, or an arterial branch. No pneumothorax. Calcification in the left apex is likely postinflammatory. This is new since previous study. Heart size is normal. Hemidiaphragms are not included within the field of view.  IMPRESSION: Nonspecific placement of left central venous catheter to the left of midline, suggesting placement either in the brachiocephalic vein, left superior vena cava, or arterial branch. No pneumothorax.   Electronically Signed   By: Lucienne Capers M.D.   On: 08/29/2014 02:17   Dg Abd Portable 1v  08/26/2014   CLINICAL DATA:  Acute GI bleed  EXAM: PORTABLE ABDOMEN - 1 VIEW  COMPARISON:  CT abdomen pelvis dated 11/10/2012  FINDINGS: Nonspecific but nonobstructive bowel gas pattern.  Surgical clips overlying the pelvis.  Vascular calcifications.  IMPRESSION: Unremarkable abdominal radiograph.   Electronically Signed   By: Julian Hy M.D.   On: 08/26/2014 22:30    Oren Binet, MD  [facility] Pager:[phone]  If 7PM-7AM, please contact night-coverage [email or web address] Password [email or web address] 09/09/2014, 12:14 PM   LOS: 1 day

## 2014-09-10 ENCOUNTER — Inpatient Hospital Stay (HOSPITAL_COMMUNITY): Payer: Medicare Other

## 2014-09-10 LAB — CBC
HCT: 33.6 % — ABNORMAL LOW (ref 39.0–52.0)
Hemoglobin: 10.4 g/dL — ABNORMAL LOW (ref 13.0–17.0)
MCH: 27.7 pg (ref 26.0–34.0)
MCHC: 31 g/dL (ref 30.0–36.0)
MCV: 89.4 fL (ref 78.0–100.0)
Platelets: 236 10*3/uL (ref 150–400)
RBC: 3.76 MIL/uL — ABNORMAL LOW (ref 4.22–5.81)
RDW: 16.7 % — ABNORMAL HIGH (ref 11.5–15.5)
WBC: 21.1 10*3/uL — ABNORMAL HIGH (ref 4.0–10.5)

## 2014-09-10 LAB — SEDIMENTATION RATE: SED RATE: 35 mm/h — AB (ref 0–16)

## 2014-09-10 MED ORDER — VANCOMYCIN HCL IN DEXTROSE 1-5 GM/200ML-% IV SOLN
1000.0000 mg | Freq: Once | INTRAVENOUS | Status: AC
  Administered 2014-09-10: 1000 mg via INTRAVENOUS
  Filled 2014-09-10: qty 200

## 2014-09-10 MED ORDER — PIPERACILLIN-TAZOBACTAM IN DEX 2-0.25 GM/50ML IV SOLN
2.2500 g | Freq: Three times a day (TID) | INTRAVENOUS | Status: DC
  Administered 2014-09-10 – 2014-09-14 (×11): 2.25 g via INTRAVENOUS
  Filled 2014-09-10 (×14): qty 50

## 2014-09-10 MED ORDER — PEG 3350-KCL-NA BICARB-NACL 420 G PO SOLR: 4000.0000 mL | Freq: Once | ORAL | Status: DC

## 2014-09-10 MED ORDER — CLINDAMYCIN PHOSPHATE 600 MG/50ML IV SOLN
600.0000 mg | Freq: Three times a day (TID) | INTRAVENOUS | Status: AC
  Administered 2014-09-10 – 2014-09-11 (×4): 600 mg via INTRAVENOUS
  Filled 2014-09-10 (×5): qty 50

## 2014-09-10 MED ORDER — VANCOMYCIN HCL 500 MG IV SOLR: 500.0000 mg | INTRAVENOUS | Status: DC

## 2014-09-10 MED ORDER — PEG 3350-KCL-NA BICARB-NACL 420 G PO SOLR
4000.0000 mL | Freq: Once | ORAL | Status: AC
  Administered 2014-09-10: 4000 mL via ORAL
  Filled 2014-09-10: qty 4000

## 2014-09-10 MED ORDER — BACITRACIN ZINC 500 UNIT/GM EX OINT
TOPICAL_OINTMENT | Freq: Two times a day (BID) | CUTANEOUS | Status: DC
  Administered 2014-09-10 – 2014-09-11 (×3): via TOPICAL
  Administered 2014-09-11: 1 via TOPICAL
  Administered 2014-09-12: 11:00:00 via TOPICAL
  Administered 2014-09-12 – 2014-09-13 (×2): 1 via TOPICAL
  Administered 2014-09-14: 11:00:00 via TOPICAL
  Administered 2014-09-14: 1 via TOPICAL
  Administered 2014-09-15 – 2014-09-16 (×3): via TOPICAL
  Administered 2014-09-16: 1 via TOPICAL
  Administered 2014-09-17 (×2): via TOPICAL
  Administered 2014-09-18 (×2): 1 via TOPICAL
  Administered 2014-09-19: 18:00:00 via TOPICAL
  Administered 2014-09-19 – 2014-09-20 (×2): 1 via TOPICAL
  Administered 2014-09-20 – 2014-09-21 (×2): via TOPICAL
  Filled 2014-09-10 (×3): qty 28.35

## 2014-09-10 NOTE — Progress Notes (Addendum)
PATIENT DETAILS Name: [name] Age: 53 y.o. Sex: male Date of Birth: 06-28-1961 Admit Date: 09/08/2014 Admitting Physician Berle Mull, MD LYY:[id number],LEXNT L, MD  Brief narrative: 53 year old male patient with known vascular disease, chronic kidney disease on dialysis, hypertension, chronic steroids for gout brought to the emergency room on 08/16/14 with severe right leg pain, evaluated by vascular surgery, and underwent right femoral to below the knee popliteal artery bypass graft on 11/27. Subsequently patient developed significant left leg swelling, with concerns for left venous stenosis related to his left thigh arteriovenous graft, on 12/3 patient underwent left thigh shuntogram with subsequent venoplasty and stenting of the left common femoral vein.Previous left foot swelling improved remarkably after this procedure. On 12/5 patient developed significant hematochezia, unfortunately developed hemorrhagic shock and required ICU transfer. He received a total of 11 units of PRBC, 4 units of FFP and temporarily required pressors to maintain blood pressure. Aspirin/Plavix were placed on hold. He slowly stabilized, hematochezia completely resolved, on 12/11 he started having brown bowel movements. GI planNED to do colonoscopy, however patient refused this bleeding had resolved.  Aspirin and Plavix were subsequently resumed, and patient was discharged home on 09/05/14. Unfortunately on 09/08/14, he had numerous recurrent hematochezia and presented back to the emergency room and was subsequently admitted.  Subjective Unable to complete bowel prep for colonoscopy-foul smelling discharge from left great toe.   Assessment/Plan: Principal Problem:   Lower GI bleed: Suspect this is secondary to diverticular disease. 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 GI consulted, plans are for colonoscopy-now  rescheduled for am-as patient unable to complete bowel prep. No hematochezia overnight.  Active Problems:   Mild acute blood loss anemia: Secondary to above, although hemoglobin was 12.3 on admission, hemoglobin today is close to his recent baseline from last admission. Monitor CBC, transfuse as needed.   Significant leukocytosis:left great toe with purulent foul smelling drainage-suspect likely cultprit. Will blood culture, check CXR for completeness, and then start Vanco/Zosyn. Have consulted [facility].Follow closely      ESRD on hemodialysis: Nephrology consulted for dialysis. Regular schedule is TTS    History of CAD with prior drug-eluting stent April 2015:no acute cardiac issues, given recurrent hematochezia aspirin/Plavix currently on hold. Will need to discuss with GI/await results of colonoscopy prior to resuming.     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    History of recent left femoral vein occlusion: Underwent venoplasty of left common femoral vein during his last admission, no swelling evident in his left leg.    History of hypertension: Currently controlled, cautiously continue with Coreg and amlodipine. If significant hematochezia results then we'll need to stop antihypertensives.    History of gout: Continue with chronic prednisone and colchicine     Penile lesion:see picture below. Will d/w Urology on call-suspect related to scab from most recent infection.  Addendum 10:20 am: Dr MacDiarmid-Urology on call reviewed below pic/chart-suggested local wound care-he thought that probably some necroses given vascular disease, recent hypotension-he thought the area will demarcate and heal slowly. He recommended daily wound care and to apply bacitracin. He does not think that the lesion is the cause of leukocytosis.     Deconditioning/generalized weakness: Secondary to recent  prolonged hospitalization/acute illness-PT evaluation.    Severe protein calorie malnutrition: Continue with supplements  Disposition: Remain inpatient  Antibiotics:  None   Anti-infectives    None      DVT Prophylaxis: SCD's  Code Status: Full code   Family Communication None at bedside  Procedures:  None  CONSULTS:  nephrology  Time spent 40 minutes-which includes 50% of the time with face-to-face with patient/ family and coordinating care related to the above assessment and plan.  MEDICATIONS: Scheduled Meds: . amLODipine  10 mg Oral Daily  . atorvastatin  40 mg Oral q1800  . calcitRIOL  2.75 mcg Oral Q T,Th,Sa-HD  . calcium acetate  2,001 mg Oral TID WC  . carvedilol  6.25 mg Oral BID WC  . cinacalcet  60 mg Oral Q breakfast  . [START ON 09/11/2014] colchicine  0.3 mg Oral Once per day on Mon Thu  . [START ON 09/14/2014] darbepoetin (ARANESP) injection - DIALYSIS  60 mcg Intravenous Q Thu-HD  . feeding supplement (RESOURCE BREEZE)  1 Container Oral TID BM  . [START ON 09/12/2014] ferric gluconate (FERRLECIT/NULECIT) IV  125 mg Intravenous Q T,Th,Sa-HD  . pantoprazole (PROTONIX) IV  40 mg Intravenous Q12H  . polyethylene glycol-electrolytes  4,000 mL Oral Once  . predniSONE  5 mg Oral Daily  . sodium chloride  3 mL Intravenous Q12H   Continuous Infusions: . sodium chloride 20 mL/hr at 09/09/14 2210   PRN Meds:.acetaminophen, acetaminophen, metoCLOPramide, ondansetron **OR** ondansetron (ZOFRAN) IV, oxyCODONE    PHYSICAL EXAM: Vital signs in last 24 hours: Filed Vitals:   09/09/14 2047 09/09/14 2100 09/10/14 0459 09/10/14 0900  BP: 172/74 151/41 152/61 137/27  Pulse: 82 86 88 72  Temp: 98 F (36.7 C) 99.3 F (37.4 C) 98.3 F (36.8 C) 97.5 F (36.4 C)  TempSrc: Oral Oral Oral Oral  Resp: 18 18 18 19   Height:      Weight: 45 kg (99 lb 3.3 oz)     SpO2: 95% 97% 100% 96%    Weight change: -1.721 kg (-3 lb 12.7 oz) Filed Weights   09/09/14  0113 09/09/14 1624 09/09/14 2047  Weight: 44.197 kg (97 lb 7 oz) 45 kg (99 lb 3.3 oz) 45 kg (99 lb 3.3 oz)   Body mass index is 13.45 kg/(m^2).   Gen Exam: Awake and alert with clear speech.   Neck: Supple, No JVD.   Chest: B/L Clear.   CVS: S1 S2 Regular, no murmurs.  Abdomen: soft, BS +, non tender, non distended.  Extremities: no edema, lower extremities warm to touch.Foul smelling ulcer in left foot with purulent drainage.Dry gangrene in several toes in right foot Neurologic: Non Focal.   Skin: No Rash.   Wounds: N/A.      Intake/Output from previous day:  Intake/Output Summary (Last 24 hours) at 09/10/14 0936 Last data filed at 09/09/14 2047  Gross per 24 hour  Intake    240 ml  Output      0 ml  Net    240 ml     LAB RESULTS: CBC  Recent Labs Lab 09/05/14 0700 09/08/14 2121 09/09/14 0426 09/09/14 1649 09/10/14 0433  WBC 11.4* 10.2 10.1 10.5 21.1*  HGB 10.3* 12.3* 10.3* 9.1* 10.4*  HCT 33.0* 40.0 34.0* 29.8* 33.6*  PLT 158 206 177 202 236  MCV 88.9 91.7 91.4 90.0 89.4  MCH 27.8 28.2 27.7 27.5 27.7  MCHC 31.2 30.8 30.3 30.5 31.0  RDW 16.5* 16.6* 16.6* 16.4* 16.7*  LYMPHSABS  --  1.5  --   --   --   MONOABS  --  1.3*  --   --   --   EOSABS  --  0.3  --   --   --   BASOSABS  --  0.0  --   --   --     Chemistries   Recent Labs Lab 09/04/14 0830 09/05/14 0700 09/08/14 2121 09/09/14 0426 09/09/14 1649  NA 137 140 141 140 141  K 4.7 4.0 4.0 4.0 3.9  CL 99 100 97 98 98  CO2 23 27 33* 32 29  GLUCOSE 99 78 85 60* 65*  BUN 36* 18 20 25* 31*  CREATININE 7.55* 5.35* 5.55* 6.21* 7.03*  CALCIUM 7.3* 7.3* 8.6 8.0* 7.7*    CBG:  Recent Labs Lab 09/04/14 1705 09/05/14 0018 09/05/14 0604 09/05/14 1212 09/05/14 1604  GLUCAP 118* 117* 78 75 82    GFR Estimated Creatinine Clearance: 7.6 mL/min (by C-G formula based on Cr of 7.03).  Coagulation profile  Recent Labs Lab 09/08/14 2121 09/09/14 0426  INR 1.16 1.22    Cardiac Enzymes No  results for input(s): CKMB, TROPONINI, MYOGLOBIN in the last 168 hours.  Invalid input(s): CK  Invalid input(s): POCBNP No results for input(s): DDIMER in the last 72 hours. No results for input(s): HGBA1C in the last 72 hours. No results for input(s): CHOL, HDL, LDLCALC, TRIG, CHOLHDL, LDLDIRECT in the last 72 hours. No results for input(s): TSH, T4TOTAL, T3FREE, THYROIDAB in the last 72 hours.  Invalid input(s): FREET3 No results for input(s): VITAMINB12, FOLATE, FERRITIN, TIBC, IRON, RETICCTPCT in the last 72 hours. No results for input(s): LIPASE, AMYLASE in the last 72 hours.  Urine Studies No results for input(s): UHGB, CRYS in the last 72 hours.  Invalid input(s): UACOL, UAPR, USPG, UPH, UTP, UGL, UKET, UBIL, UNIT, UROB, ULEU, UEPI, UWBC, URBC, UBAC, CAST, UCOM, BILUA  MICROBIOLOGY: No results found for this or any previous visit (from the past 240 hour(s)).  RADIOLOGY STUDIES/RESULTS: Ct Abdomen Pelvis W Contrast  08/27/2014   CLINICAL DATA:  Acute onset of bright red blood per rectum, with periumbilical cramping abdominal pain. Initial encounter.  EXAM: CT ABDOMEN AND PELVIS WITH CONTRAST  TECHNIQUE: Multidetector CT imaging of the abdomen and pelvis was performed using the standard protocol following bolus administration of intravenous contrast.  CONTRAST:  169mL OMNIPAQUE IOHEXOL 300 MG/ML  SOLN  COMPARISON:  CT of the abdomen and pelvis from 11/10/2012  FINDINGS: Trace bilateral pleural effusions are seen, with bibasilar atelectasis.  A nonspecific 6 mm hypodensity within the left hepatic lobe is of uncertain significance. The liver is otherwise unremarkable. The spleen is mildly bulky, but remains normal in length. There is persistent chronic gallbladder wall thickening. The gallbladder is difficult to fully assess given trace ascites tracking about the liver.  The pancreas and adrenal glands are unremarkable.  The patient's native kidneys are markedly atrophic, with scattered  bilateral renal cysts of varying size and attenuation. Scattered associated calcifications are seen. There is no evidence of hydronephrosis. No obstructing renal stones are seen.  There is marked fatty infiltration with regard to the transplant kidney at the left iliac fossa, with marked thinning of the renal parenchyma. Diffuse associated vascular calcifications are seen. This appearance is stable from 2014.  There is mild diffuse mesenteric and omental edema, nonspecific in appearance.  The stomach is within normal limits. No acute vascular abnormalities are seen. Diffuse calcification is seen along the abdominal aorta and its branches. Extensive diffuse vascular calcification is noted throughout the abdomen and pelvis.  The patient  appears to be status post resection of much of the bowel. Remaining small bowel is grossly unremarkable. A distended anastomosis is noted at the right hemipelvis; this corresponds to the site of the prior right pelvic catheter, which has been removed.  The appendix is not definitely characterized; there is no evidence for appendicitis. Contrast progresses to the level of the proximal sigmoid colon. There is scattered diverticulosis along the transverse colon. Stool is noted filling the distal sigmoid colon and rectum.  There is soft tissue inflammation about the distal sigmoid colon and rectum, raising question for proctitis. Diffuse presacral stranding is also noted, new from the prior study.  The bladder is not well assessed. Soft tissue density about the expected location of the bladder is relatively stable in appearance. 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 This is perhaps slightly more prominent than on the prior study. No inguinal lymphadenopathy is seen.  No acute osseous abnormalities are identified.  IMPRESSION: 1. Soft tissue inflammation about the distal sigmoid colon and rectum,  with diffuse presacral stranding, new from the prior study. This raises concern for proctitis, which may explain the patient's bright red blood per rectum. Stool noted filling the distal sigmoid colon and rectum. 2. Scattered diverticulosis along the transverse colon, without evidence of diverticulitis; the colon is otherwise grossly unremarkable. 3. Trace bilateral pleural effusions, with bibasilar atelectasis. 4. Marked atrophy of the patient's bilateral native kidneys, with bilateral renal cysts and scattered calcifications. Marked fatty infiltration of the transplant kidney at the left iliac fossa, with marked thinning of the renal parenchyma. This appearance is stable from 2014. 5. Stable appearance to soft tissue density about the expected location of the bladder. 6. 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 7. Diffuse calcification along the abdominal aorta and its branches. Extensive diffuse vascular calcification seen throughout the abdomen and pelvis. 8. Nonspecific 6 mm hypodensity within the left hepatic lobe, new from prior studies. 9. Persistent chronic gallbladder wall thickening; gallbladder difficult to fully assess given trace ascites about the liver, but this appearance is relatively stable. 10. Mild nonspecific diffuse mesenteric and omental edema.   Electronically Signed   By: Garald Balding M.D.   On: 08/27/2014 01:30   Dg Chest Port 1 View  08/29/2014   CLINICAL DATA:  Central line placement.  EXAM: PORTABLE CHEST - 1 VIEW  COMPARISON:  07/03/2012  FINDINGS: Interval placement of a left central venous catheter. Tip projects to the left mediastinum over the aortic arch. This could be in the brachiocephalic vein, in a normal variant left superior vena cava, or an arterial branch. No pneumothorax. Calcification in the left apex is likely postinflammatory. This is new  since previous study. Heart size is normal. Hemidiaphragms are not included within the field of view.  IMPRESSION: Nonspecific placement of left central venous catheter to the left of midline, suggesting placement either in the brachiocephalic vein, left superior vena cava, or arterial branch. No pneumothorax.   Electronically Signed   By: Lucienne Capers M.D.   On: 08/29/2014 02:17   Dg Abd Portable 1v  08/26/2014   CLINICAL DATA:  Acute GI bleed  EXAM: PORTABLE ABDOMEN - 1 VIEW  COMPARISON:  CT abdomen pelvis dated 11/10/2012  FINDINGS: Nonspecific but nonobstructive bowel gas pattern.  Surgical clips overlying the pelvis.  Vascular calcifications.  IMPRESSION: Unremarkable abdominal radiograph.   Electronically Signed   By: Julian Hy M.D.   On: 08/26/2014 22:30    Oren Binet, MD  [facility] Pager:[phone]  If 7PM-7AM, please contact night-coverage [email or web address] Password [email or web address] 09/10/2014, 9:36 AM   LOS: 2 days

## 2014-09-10 NOTE — Progress Notes (Signed)
Progress Note for [facility]  Subjective: No acute events.  Objective: Vital signs in last 24 hours: Temp:  [98 F (36.7 C)-99.3 F (37.4 C)] 98.3 F (36.8 C) (12/20 0459) Pulse Rate:  [79-88] 88 (12/20 0459) Resp:  [15-21] 18 (12/20 0459) BP: (131-210)/(41-107) 152/61 mmHg (12/20 0459) SpO2:  [95 %-100 %] 100 % (12/20 0459) Weight:  [45 kg (99 lb 3.3 oz)] 45 kg (99 lb 3.3 oz) (12/19 2047) Last BM Date: 09/10/14  Intake/Output from previous day: 12/19 0701 - 12/20 0700 In: 240 [P.O.:240] Out: 0  Intake/Output this shift:    General appearance: alert and no distress GI: soft, non-tender; bowel sounds normal; no masses,  no organomegaly  Lab Results:  Recent Labs  09/09/14 0426 09/09/14 1649 09/10/14 0433  WBC 10.1 10.5 21.1*  HGB 10.3* 9.1* 10.4*  HCT 34.0* 29.8* 33.6*  PLT 177 202 236   BMET  Recent Labs  09/08/14 2121 09/09/14 0426 09/09/14 1649  NA 141 140 141  K 4.0 4.0 3.9  CL 97 98 98  CO2 33* 32 29  GLUCOSE 85 60* 65*  BUN 20 25* 31*  CREATININE 5.55* 6.21* 7.03*  CALCIUM 8.6 8.0* 7.7*   LFT  Recent Labs  09/09/14 0426 09/09/14 1649  PROT 5.4*  --   ALBUMIN 1.8* 1.8*  AST 25  --   ALT 11  --   ALKPHOS 134*  --   BILITOT 0.8  --    PT/INR  Recent Labs  09/08/14 2121 09/09/14 0426  LABPROT 14.9 15.5*  INR 1.16 1.22   Hepatitis Panel No results for input(s): HEPBSAG, HCVAB, HEPAIGM, HEPBIGM in the last 72 hours. C-Diff No results for input(s): CDIFFTOX in the last 72 hours. Fecal Lactopherrin No results for input(s): FECLLACTOFRN in the last 72 hours.  Studies/Results: No results found.  Medications:  Scheduled: . amLODipine  10 mg Oral Daily  . atorvastatin  40 mg Oral q1800  . calcitRIOL  2.75 mcg Oral Q T,Th,Sa-HD  . calcium acetate  2,001 mg Oral TID WC  . carvedilol  6.25 mg Oral BID WC  . cinacalcet  60 mg Oral Q breakfast  . [START ON 09/11/2014] colchicine  0.3 mg Oral Once per day on Mon Thu  . [START ON  09/14/2014] darbepoetin (ARANESP) injection - DIALYSIS  60 mcg Intravenous Q Thu-HD  . feeding supplement (RESOURCE BREEZE)  1 Container Oral TID BM  . [START ON 09/12/2014] ferric gluconate (FERRLECIT/NULECIT) IV  125 mg Intravenous Q T,Th,Sa-HD  . pantoprazole (PROTONIX) IV  40 mg Intravenous Q12H  . predniSONE  5 mg Oral Daily  . sodium chloride  3 mL Intravenous Q12H   Continuous: . sodium chloride 20 mL/hr at 09/09/14 2210    Assessment/Plan: 1) Hematochezia. 2) Anemia.   The patient was sitting in a pool of semi-solid stool.  No evidence of any blood.  He only drank half of the prep.  The colonoscopy was cancelled.  Plan: 1) Reschedule for the colonoscopy tomorrow with  GI. 2) Follow HGB and transfuse as necessary.   LOS: 2 days   [name] D 09/10/2014, 8:54 AM

## 2014-09-10 NOTE — Consult Note (Signed)
Consult Note  Patient name: [name] MRN: [id number] DOB: 04-Apr-1961 Sex: male  Consulting Physician:  Hospitalist  Reason for Consult:  Chief Complaint  Patient presents with  . GI Bleeding    HISTORY OF PRESENT ILLNESS: The patient is well known to me, having undergone bilateral percutaneous revascularization over the course of the past year for nonhealing wounds.  He has a chronic appearing wound on the left great toe which is the side of his dialysis graft.  He developed thrombosis of his right popliteal artery approximately 1 week following his percutaneous intervention.  He had rest pain.  Therefore on 1127 he underwent a right femoral to below-knee popliteal artery bypass graft with propatent.  The patient has developed dry gangrene of his first 3 toes and the plantar side of his distal foot.  During his hospitalization he developed a GI bleed.  The result when discharged.  He did return yesterday with recurrent GI bleeding.  He was found to have an increase in his white blood cell count to 22,000 and a foul-smelling great toe.  X-rays indicated possible infection.  Patient also has a significant cardiac history.  He has undergone drug-eluting stent placement in the last 6 months and continues on Plavix which has been on hold for a few days given his GI bleed.  Past Medical History  Diagnosis Date  . ESRD on hemodialysis     a. ESRD 2/2 HTN with renal transplant in 1987 (cadaveric) after short period of dialysis;  b. Transplant failed in 2004 and he went back on HD;  c. As of 10/15 getting HD via L thigh AVG on a TTS schedule at [facility] on [facility].  . Hypertension   . Hx of kidney transplant     a. 1987-> back on HD since 2004  . Gout tophi   . Chronic steroid use     a. Has severe gout. Did not tolerate Allopurinol. Do not taper per PCP   . Systolic CHF, chronic     2 D echo 04/2012 with EF of 45 %   . Malnutrition   . Hx SBO 04/2012    a. 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 F/U with Dr Donne Hazel. Started on TNA  . Anemia associated with chronic renal failure   . Hepatitis C   . GERD (gastroesophageal reflux disease)   . H/O hiatal hernia   . Arthritis   . History of DVT (deep vein thrombosis)   . Peripheral vascular disease     a. 12/2013 PTA of L Pop;  b. 12/2013 PTA R pop, R DP;  c. 06/2014 L Pop CBA/DCB PTA.  . Prostate cancer   . CAD (coronary artery disease)     a. 12/2013 Cath/PCI: EF 45-50%, LM Ca2+, LAD 30-40p, 45m, D1/2/3 min irregs, LCX 50ost, 30-37m, RCA 70p, 95/49m (Rota->3.0x23 Xience distal, 3.0x23 Xience mid, 3.0x28 Xience prox).  . Chronic diastolic CHF (congestive heart failure)     a. 12/2013 Echo: EF 55-60%, mild LVH, nl wall motion, Gr 2 DD.  . Dry gangrene     a. L great toe    Past Surgical History  Procedure Laterality Date  . Laparotomy  04/14/2012    Procedure: EXPLORATORY LAPAROTOMY;  Surgeon: Joyice Faster. Cornett, MD;  Location: [place];  Service: General;  Laterality: N/A;  . Colon resection  04/14/2012  .  Laparotomy  04/22/2012    Procedure: EXPLORATORY LAPAROTOMY;  Surgeon: Adin Hector, MD;  Location: [place];  Service: General;  Laterality: N/A;  lysis of adhesions, omentoplasty, repair small bowel  . Thrombectomy w/ embolectomy  04/27/2012    Procedure: THROMBECTOMY ARTERIOVENOUS GORE-TEX GRAFT;  Surgeon: Angelia Mould, MD;  Location: [place];  Service: Vascular;  Laterality: Left;  Thrombectomy of left thigh arteriovenous gortex graft  . Prostectomy  2011  . Renal grafts    . Pelvic abcess drainage Right 6/14    removal drain s/p bowl resection 13  . Transanal excision of rectal mass N/A 03/30/2013    Procedure: EXCISION OF anal MASS;  Surgeon: Joyice Faster. Cornett, MD;  Location: [place];  Service: General;  Laterality: N/A;  Exam under anesthesia with excision anal verge mass  . Aortogram  08/15/2014    abd aortogram  .  Femoral-popliteal bypass graft Right 08/18/2014    Procedure: BYPASS GRAFT FEMORAL-POPLITEAL ARTERY with Gortex Graft;  Surgeon: Serafina Mitchell, MD;  Location: [place];  Service: Vascular;  Laterality: Right;  . Shuntogram Left 09/29/2012    Procedure: SHUNTOGRAM;  Surgeon: Serafina Mitchell, MD;  Location: [facility] CATH LAB;  Service: Cardiovascular;  Laterality: Left;  . Abdominal aortagram N/A 01/03/2014    Procedure: ABDOMINAL Maxcine Ham;  Surgeon: Serafina Mitchell, MD;  Location: [facility] CATH LAB;  Service: Cardiovascular;  Laterality: N/A;  . Abdominal aortagram Right 01/11/2014    Procedure: ABDOMINAL AORTAGRAM;  Surgeon: Serafina Mitchell, MD;  Location: [facility] CATH LAB;  Service: Cardiovascular;  Laterality: Right;  . Left heart catheterization with coronary angiogram N/A 01/12/2014    Procedure: LEFT HEART CATHETERIZATION WITH CORONARY ANGIOGRAM;  Surgeon: Leonie Man, MD;  Location: [facility] CATH LAB;  Service: Cardiovascular;  Laterality: N/A;  . Percutaneous coronary rotoblator intervention (pci-r) N/A 01/13/2014    Procedure: PERCUTANEOUS CORONARY ROTOBLATOR INTERVENTION (PCI-R);  Surgeon: Burnell Blanks, MD;  Location: [facility] CATH LAB;  Service: Cardiovascular;  Laterality: N/A;  . Abdominal aortagram N/A 07/19/2014    Procedure: ABDOMINAL AORTAGRAM;  Surgeon: Serafina Mitchell, MD;  Location: [facility] CATH LAB;  Service: Cardiovascular;  Laterality: N/A;  . Shuntogram N/A 08/24/2014    Procedure: Earney Mallet;  Surgeon: Conrad Ouray, MD;  Location: [facility] CATH LAB;  Service: Cardiovascular;  Laterality: N/A;    History   Social History  . Marital Status: Single    Spouse Name: N/A    Number of Children: N/A  . Years of Education: N/A   Occupational History  . Not on file.   Social History Main Topics  . Smoking status: Former Smoker -- 1.00 packs/day for 10 years    Types: Cigarettes    Quit date: 03/23/1973  . Smokeless tobacco: Never Used     Comment: Quit 30 years ago  . Alcohol Use: No  . Drug Use: No   . Sexual Activity: No   Other Topics Concern  . Not on file   Social History Narrative   Currently in the nursing home until end of the month then will go to live with sister.           Family History  Problem Relation Age of Onset  . Hypertension Father   . Pneumonia Brother   . Lung disease Brother     Allergies as of 09/08/2014 - Review Complete 09/08/2014  Allergen Reaction Noted  . Allopurinol Other (See Comments) 02/27/2010    Current Facility-Administered Medications on File Prior to  Encounter  Medication Dose Route Frequency Provider Last Rate Last Dose  . fentaNYL (SUBLIMAZE) injection   Intravenous PRN Carylon Perches, MD   25 mcg at 05/05/12 1544  . midazolam (VERSED) 5 MG/5ML injection   Intravenous PRN Carylon Perches, MD   2 mg at 05/05/12 1544   Current Outpatient Prescriptions on File Prior to Encounter  Medication Sig Dispense Refill  . amLODipine (NORVASC) 5 MG tablet Take 2 tablets (10 mg total) by mouth daily. 30 tablet 0  . aspirin EC 81 MG tablet Take 81 mg by mouth daily.    Marland Kitchen atorvastatin (LIPITOR) 40 MG tablet TAKE 1 TABLET BY MOUTH DAILY AT 6 PM 30 tablet 0  . calcium acetate (PHOSLO) 667 MG tablet Take 3 tablets by mouth 3 (three) times daily. 270 tablet 0  . carvedilol (COREG) 6.25 MG tablet Take 1 tablet (6.25 mg total) by mouth 2 (two) times daily with a meal. 60 tablet 9  . cinacalcet (SENSIPAR) 60 MG tablet Take 1 tablet (60 mg total) by mouth daily. 30 tablet 0  . clopidogrel (PLAVIX) 75 MG tablet Take 1 tablet (75 mg total) by mouth daily with breakfast. 30 tablet 0  . colchicine (COLCRYS) 0.6 MG tablet Take 0.5 tablets (0.3 mg total) by mouth 2 (two) times a week. On Monday and Thursday 60 tablet 0  . metoCLOPramide (REGLAN) 10 MG tablet Take 1 tablet (10 mg total) by mouth every 8 (eight) hours as needed for nausea or vomiting. 30 tablet 0  . multivitamin (RENA-VIT) TABS tablet Take 1 tablet by mouth daily. 30 tablet 0  . oxyCODONE  (ROXICODONE) 5 MG immediate release tablet Take 1-2 tablets (5-10 mg total) by mouth every 6 (six) hours as needed for severe pain. 60 tablet 0  . pantoprazole (PROTONIX) 40 MG tablet Take 1 tablet (40 mg total) by mouth daily. 30 tablet 0  . polyethylene glycol (MIRALAX / GLYCOLAX) packet Take 17 g by mouth daily. 30 each 0  . predniSONE (DELTASONE) 5 MG tablet Take 1 tablet (5 mg total) by mouth daily. 30 tablet 0  . senna-docusate (SENOKOT-S) 8.6-50 MG per tablet Take 1 tablet by mouth 2 (two) times daily as needed for mild constipation. 30 tablet 0  . Nutritional Supplements (FEEDING SUPPLEMENT, NEPRO CARB STEADY,) LIQD Take 237 mLs by mouth as needed (missed meal during dialysis.). (Patient not taking: Reported on 09/08/2014) 30 Can 0  . simethicone (MYLICON) 40 ZD/6.3OV drops Take 0.6 mLs (40 mg total) by mouth 4 (four) times daily as needed for flatulence. (Patient not taking: Reported on 09/08/2014) 30 mL 0     REVIEW OF SYSTEMS: Please see history of present illness, no changes  PHYSICAL EXAMINATION: General: The patient appears their stated age.  Vital signs are BP 137/27 mmHg  Pulse 72  Temp(Src) 97.5 F (36.4 C) (Oral)  Resp 19  Ht 6' (1.829 m)  Wt 99 lb 3.3 oz (45 kg)  BMI 13.45 kg/m2  SpO2 96% Pulmonary: Respirations are non-labored HEENT:  No gross abnormalities Abdomen: Soft and non-tender  Musculoskeletal: There are no major deformities.   Neurologic: No focal weakness or paresthesias are detected, Skin: Dry gangrene to right toes 1 through 3 as well as some areas of early necrosis on the plantar side of his right foot.  The left great toe continues to have foul-smelling drainage with a small open wound on the medial side.  It is very tender to the touch.  No significant erythema is  observed.  The right femoral incision is getting packed.  This is not the source of his infection. Psychiatric: The patient has normal affect. Cardiovascular: There is a regular rate and  rhythm without significant murmur appreciated.  Diagnostic Studies: None   Assessment:  Possible infected left second toe. Plan: On visual inspection, the left great toe does not look significantly different than it did 2 weeks ago, however he now has a white blood cell count as well as air on x-ray suggesting infection.  Of course with his abdominal history he could have a GI source for his white blood cell count, however he has a very benign abdomen.  He is scheduled to get colonoscopy tomorrow.  I discussed with the patient that he is going to need amputation of his left great toe.  I will try to get this coordinated tomorrow by one of my partners, as I am in the office on today.  If this cannot be done I would be available to do a left great toe at the patient on Tuesday.  I did discuss with the patient that if his toe amputation does not have adequate perfusion to heal, he may need a more proximal amputation, as dramatic as a possible above-the-knee amputation.     Eldridge Abrahams, M.D. [facility] Office: [phone] Pager:  [phone]

## 2014-09-10 NOTE — Progress Notes (Signed)
ANTIBIOTIC CONSULT NOTE - INITIAL  Pharmacy Consult for Vancomycin and Zosyn Indication: Toe infection  Allergies  Allergen Reactions  . Allopurinol Other (See Comments)    REACTION: decreased platelets    Patient Measurements: Height: 6' (182.9 cm) Weight: 99 lb 3.3 oz (45 kg) IBW/kg (Calculated) : 77.6 Adjusted Body Weight:   Vital Signs: Temp: 97.5 F (36.4 C) (12/20 0900) Temp Source: Oral (12/20 0900) BP: 137/27 mmHg (12/20 0900) Pulse Rate: 72 (12/20 0900) Intake/Output from previous day: 12/19 0701 - 12/20 0700 In: 240 [P.O.:240] Out: 0  Intake/Output from this shift:    Labs:  Recent Labs  09/08/14 2121 09/09/14 0426 09/09/14 1649 09/10/14 0433  WBC 10.2 10.1 10.5 21.1*  HGB 12.3* 10.3* 9.1* 10.4*  PLT 206 177 202 236  CREATININE 5.55* 6.21* 7.03*  --    Estimated Creatinine Clearance: 7.6 mL/min (by C-G formula based on Cr of 7.03). No results for input(s): VANCOTROUGH, VANCOPEAK, VANCORANDOM, GENTTROUGH, GENTPEAK, GENTRANDOM, TOBRATROUGH, TOBRAPEAK, TOBRARND, AMIKACINPEAK, AMIKACINTROU, AMIKACIN in the last 72 hours.   Microbiology: Recent Results (from the past 720 hour(s))  Surgical pcr screen     Status: None   Collection Time: 08/26/14  5:20 PM  Result Value Ref Range Status   MRSA, PCR NEGATIVE NEGATIVE Final   Staphylococcus aureus NEGATIVE NEGATIVE Final    Comment:        The Xpert SA Assay (FDA approved for NASAL specimens in patients over 60 years of age), is one component of a comprehensive surveillance program.  Test performance has been validated by [employer] for patients greater than or equal to 49 year old. It is not intended to diagnose infection nor to guide or monitor treatment.     Medical History: Past Medical History  Diagnosis Date  . ESRD on hemodialysis     a. ESRD 2/2 HTN with renal transplant in 1987 (cadaveric) after short period of dialysis;  b. Transplant failed in 2004 and he went back on HD;  c. As of  10/15 getting HD via L thigh AVG on a TTS schedule at [facility] on [facility].  . Hypertension   . Hx of kidney transplant     a. 1987-> back on HD since 2004  . Gout tophi   . Chronic steroid use     a. Has severe gout. Did not tolerate Allopurinol. Do not taper per PCP   . Systolic CHF, chronic     2 D echo 04/2012 with EF of 45 %   . Malnutrition   . Hx SBO 04/2012    a. 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 F/U with Dr Donne Hazel. Started on TNA  . Anemia associated with chronic renal failure   . Hepatitis C   . GERD (gastroesophageal reflux disease)   . H/O hiatal hernia   . Arthritis   . History of DVT (deep vein thrombosis)   . Peripheral vascular disease     a. 12/2013 PTA of L Pop;  b. 12/2013 PTA R pop, R DP;  c. 06/2014 L Pop CBA/DCB PTA.  . Prostate cancer   . CAD (coronary artery disease)     a. 12/2013 Cath/PCI: EF 45-50%, LM Ca2+, LAD 30-40p, 84m, D1/2/3 min irregs, LCX 50ost, 30-35m, RCA 70p, 95/38m (Rota->3.0x23 Xience distal, 3.0x23 Xience mid, 3.0x28 Xience prox).  . Chronic diastolic CHF (congestive heart failure)     a. 12/2013 Echo: EF 55-60%,  mild LVH, nl wall motion, Gr 2 DD.  . Dry gangrene     a. L great toe    Medications:  Prescriptions prior to admission  Medication Sig Dispense Refill Last Dose  . amLODipine (NORVASC) 5 MG tablet Take 2 tablets (10 mg total) by mouth daily. 30 tablet 0 09/07/2014 at Unknown time  . aspirin EC 81 MG tablet Take 81 mg by mouth daily.   09/07/2014 at Unknown time  . atorvastatin (LIPITOR) 40 MG tablet TAKE 1 TABLET BY MOUTH DAILY AT 6 PM 30 tablet 0 09/07/2014 at Unknown time  . calcium acetate (PHOSLO) 667 MG tablet Take 3 tablets by mouth 3 (three) times daily. 270 tablet 0 09/07/2014 at Unknown time  . carvedilol (COREG) 6.25 MG tablet Take 1 tablet (6.25 mg total) by mouth 2 (two) times daily with a meal. 60 tablet 9 09/07/2014 at 0800  .  cinacalcet (SENSIPAR) 60 MG tablet Take 1 tablet (60 mg total) by mouth daily. 30 tablet 0 09/07/2014 at Unknown time  . clopidogrel (PLAVIX) 75 MG tablet Take 1 tablet (75 mg total) by mouth daily with breakfast. 30 tablet 0 09/07/2014 at Unknown time  . colchicine (COLCRYS) 0.6 MG tablet Take 0.5 tablets (0.3 mg total) by mouth 2 (two) times a week. On Monday and Thursday 60 tablet 0 09/07/2014 at Unknown time  . metoCLOPramide (REGLAN) 10 MG tablet Take 1 tablet (10 mg total) by mouth every 8 (eight) hours as needed for nausea or vomiting. 30 tablet 0 Past Week at Unknown time  . multivitamin (RENA-VIT) TABS tablet Take 1 tablet by mouth daily. 30 tablet 0 09/07/2014 at Unknown time  . oxyCODONE (ROXICODONE) 5 MG immediate release tablet Take 1-2 tablets (5-10 mg total) by mouth every 6 (six) hours as needed for severe pain. 60 tablet 0 09/08/2014 at Unknown time  . pantoprazole (PROTONIX) 40 MG tablet Take 1 tablet (40 mg total) by mouth daily. 30 tablet 0 09/08/2014 at Unknown time  . polyethylene glycol (MIRALAX / GLYCOLAX) packet Take 17 g by mouth daily. 30 each 0 09/07/2014 at Unknown time  . predniSONE (DELTASONE) 5 MG tablet Take 1 tablet (5 mg total) by mouth daily. 30 tablet 0 09/07/2014 at Unknown time  . senna-docusate (SENOKOT-S) 8.6-50 MG per tablet Take 1 tablet by mouth 2 (two) times daily as needed for mild constipation. 30 tablet 0 Past Week at Unknown time  . Nutritional Supplements (FEEDING SUPPLEMENT, NEPRO CARB STEADY,) LIQD Take 237 mLs by mouth as needed (missed meal during dialysis.). (Patient not taking: Reported on 09/08/2014) 30 Can 0 Not Taking at Unknown time  . simethicone (MYLICON) 40 BP/1.0CH drops Take 0.6 mLs (40 mg total) by mouth 4 (four) times daily as needed for flatulence. (Patient not taking: Reported on 09/08/2014) 30 mL 0 Not Taking at Unknown time   Assessment: 53yom with ESRD on HD TTS to start Vancomycin and Zosyn for L great toe infection. Patient is  currently afebrile but WBC jumped to 21.1. - Weight: 45kg  Goal of Therapy:  Pre-HD Vancomycin level: 15-75mcg/ml  Plan:  1. Vancomycin 1g IV x 1, then 500mg  IV qHD (TTS) 2. Zosyn 2.25g IV q8h 3. Monitor renal plans, HD schedule, cultures, clinical course and adjust as indicated   Earleen Newport  [phone] 09/10/2014,10:33 AM

## 2014-09-10 NOTE — Progress Notes (Signed)
[facility] Progress Note  Assessment/Plan: 1. Recurrent GIB; had last admission thought to have been secondary to ischemic colitis; again - possibly ischemic - given severe vascular disease - no noted BP drops during HD; keep systolic BP >789; GI planning colonoscopy tomorrow 2. ESRD - TTS - HD Saturday - kept even - next HD would be Monday on holiday schedule, but given colonoscopy tomorrow, will wait until Tuesday; check labs in am 3. Hypertension/volume - BP 130 - 150 keep > 130 on HD to avoid ischemia; on norvasc 10/carvedilol6.25 bid 4. Anemia - slight decline in Hgb; dropped to 9.1 pre HD Sat up to 10.4 post HD Sunday am - ^ Fe (125 x 4) ESA per routine-  5. Metabolic bone disease - controlled with current calcitiriol/ sensipar/binders dose, Corr Ca/P ok 6. Protein calorie malnutrition - Alb 1.8 added Resource supplements to CL  7. Severe PVD s/o recent bilateral fem pops in Oct and Nov with nonhealing foot wounds; follow conservatively 8. Hep C + 9. Chronic steroid use - for gout 10. Leukocytosis - WBC in the 10s - jumped to 21 K Sat pm - tmax 99.3- left foot infection most likely - d/w primary; VVS to consult, culture, start empiric Vanc and Zosyn 11. Multiple wounds - ulcer tip of penis, right foot toes/distal foot dry gangrene; left great toe, gangrene  [name], PA-C [place] [phone] 09/10/2014,9:05 AM  LOS: 2 days   Pt seen, examined and agree w A/P as above.  [name] MD pager [phone]    cell [phone] 09/10/2014, 11:27 AM    Subjective:   No c/o - feet hurt intermittently, sometimes they itch or feel numb; said some HD nurses have trouble cannulating his thigh graft  Objective Filed Vitals:   09/09/14 2015 09/09/14 2047 09/09/14 2100 09/10/14 0459  BP: 131/48 172/74 151/41 152/61  Pulse: 83 82 86 88  Temp:  98 F (36.7 C) 99.3 F (37.4 C) 98.3 F (36.8 C)  TempSrc:  Oral Oral Oral  Resp: 20 18 18 18    Height:      Weight:  45 kg (99 lb 3.3 oz)    SpO2:  95% 97% 100%   Physical Exam General: NAD, emaciated Heart: RRR Lungs: no rales Abdomen: soft NT Extremities: no edema; left great toe gangrene scant drainage very foul odor; right foot 1-3 toes black with irregular distal foot dry gangrene Dialysis Access: left thigh AVGG + bruit  Dialysis Orders: TTS @ [facility] 180 3:30 46.5 kg 2K/2Ca 400/800 Profile 2 Heparin none L thigh AVG  Calcitriol 2.75 mcg Aranesp 60 mcg on Thurs Venofer 50 per week Recent labs from 12/17 Hgb 11.3 Fe 22, 27% sat K 3.9 Ca 9.4 P 3 Alb 2.9 iPTH 217 WBC 11.56  Additional Objective Labs: Basic Metabolic Panel:  Recent Labs Lab 09/04/14 0830 09/05/14 0700 09/08/14 2121 09/09/14 0426 09/09/14 1649  NA 137 140 141 140 141  K 4.7 4.0 4.0 4.0 3.9  CL 99 100 97 98 98  CO2 23 27 33* 32 29  GLUCOSE 99 78 85 60* 65*  BUN 36* 18 20 25* 31*  CREATININE 7.55* 5.35* 5.55* 6.21* 7.03*  CALCIUM 7.3* 7.3* 8.6 8.0* 7.7*  PHOS 3.7 2.8  --   --  4.9*   Liver Function Tests:  Recent Labs Lab 09/08/14 2121 09/09/14 0426 09/09/14 1649  AST 36 25  --   ALT 15 11  --   ALKPHOS 166* 134*  --  BILITOT 1.1 0.8  --   PROT 6.3 5.4*  --   ALBUMIN 2.2* 1.8* 1.8*   CBC:  Recent Labs Lab 09/05/14 0700 09/08/14 2121 09/09/14 0426 09/09/14 1649 09/10/14 0433  WBC 11.4* 10.2 10.1 10.5 21.1*  NEUTROABS  --  7.0  --   --   --   HGB 10.3* 12.3* 10.3* 9.1* 10.4*  HCT 33.0* 40.0 34.0* 29.8* 33.6*  MCV 88.9 91.7 91.4 90.0 89.4  PLT 158 206 177 202 236  CBG:  Recent Labs Lab 09/04/14 1705 09/05/14 0018 09/05/14 0604 09/05/14 1212 09/05/14 1604  GLUCAP 118* 117* 78 75 82  Medications: . sodium chloride 20 mL/hr at 09/09/14 2210   . amLODipine  10 mg Oral Daily  . atorvastatin  40 mg Oral q1800  . calcitRIOL  2.75 mcg Oral Q T,Th,Sa-HD  . calcium acetate  2,001 mg Oral TID WC  . carvedilol  6.25 mg Oral BID WC  .  cinacalcet  60 mg Oral Q breakfast  . [START ON 09/11/2014] colchicine  0.3 mg Oral Once per day on Mon Thu  . [START ON 09/14/2014] darbepoetin (ARANESP) injection - DIALYSIS  60 mcg Intravenous Q Thu-HD  . feeding supplement (RESOURCE BREEZE)  1 Container Oral TID BM  . [START ON 09/12/2014] ferric gluconate (FERRLECIT/NULECIT) IV  125 mg Intravenous Q T,Th,Sa-HD  . pantoprazole (PROTONIX) IV  40 mg Intravenous Q12H  . polyethylene glycol-electrolytes  4,000 mL Oral Once  . predniSONE  5 mg Oral Daily  . sodium chloride  3 mL Intravenous Q12H

## 2014-09-11 ENCOUNTER — Encounter (HOSPITAL_COMMUNITY): Payer: Self-pay | Admitting: *Deleted

## 2014-09-11 ENCOUNTER — Encounter (HOSPITAL_COMMUNITY)
Admission: EM | Disposition: A | Discharge status: Home Health | Payer: Self-pay | Source: Home / Self Care | Attending: Family Medicine

## 2014-09-11 ENCOUNTER — Inpatient Hospital Stay (HOSPITAL_COMMUNITY): Payer: Medicare Other | Admitting: Anesthesiology

## 2014-09-11 ENCOUNTER — Ambulatory Visit: Payer: Medicare Other | Admitting: Surgery

## 2014-09-11 DIAGNOSIS — K5731 Diverticulosis of large intestine without perforation or abscess with bleeding: Principal | ICD-10-CM

## 2014-09-11 HISTORY — PX: COLONOSCOPY: SHX5424

## 2014-09-11 LAB — CBC
HCT: 30.5 % — ABNORMAL LOW (ref 39.0–52.0)
HEMATOCRIT: 28.8 % — AB (ref 39.0–52.0)
Hemoglobin: 9.5 g/dL — ABNORMAL LOW (ref 13.0–17.0)
Hemoglobin: 8.9 g/dL — ABNORMAL LOW (ref 13.0–17.0)
MCH: 28.2 pg (ref 26.0–34.0)
MCH: 27.3 pg (ref 26.0–34.0)
MCHC: 31.1 g/dL (ref 30.0–36.0)
MCHC: 30.9 g/dL (ref 30.0–36.0)
MCV: 90.5 fL (ref 78.0–100.0)
MCV: 88.3 fL (ref 78.0–100.0)
Platelets: 160 10*3/uL (ref 150–400)
Platelets: 170 10*3/uL (ref 150–400)
RBC: 3.37 MIL/uL — ABNORMAL LOW (ref 4.22–5.81)
RBC: 3.26 MIL/uL — AB (ref 4.22–5.81)
RDW: 16.2 % — ABNORMAL HIGH (ref 11.5–15.5)
RDW: 16.2 % — AB (ref 11.5–15.5)
WBC: 10.9 10*3/uL — ABNORMAL HIGH (ref 4.0–10.5)
WBC: 8.7 10*3/uL (ref 4.0–10.5)

## 2014-09-11 LAB — RENAL FUNCTION PANEL
ALBUMIN: 1.7 g/dL — AB (ref 3.5–5.2)
ANION GAP: 13 (ref 5–15)
BUN: 19 mg/dL (ref 6–23)
CO2: 26 meq/L (ref 19–32)
Calcium: 7.5 mg/dL — ABNORMAL LOW (ref 8.4–10.5)
Chloride: 99 mEq/L (ref 96–112)
Creatinine, Ser: 5.53 mg/dL — ABNORMAL HIGH (ref 0.50–1.35)
GFR calc Af Amer: 12 mL/min — ABNORMAL LOW (ref >90)
GFR, EST NON AFRICAN AMERICAN: 10 mL/min — AB (ref >90)
Glucose, Bld: 76 mg/dL (ref 70–99)
POTASSIUM: 4.5 meq/L (ref 3.7–5.3)
Phosphorus: 4.4 mg/dL (ref 2.3–4.6)
Sodium: 138 mEq/L (ref 137–147)

## 2014-09-11 LAB — BASIC METABOLIC PANEL
Anion gap: 13 (ref 5–15)
BUN: 18 mg/dL (ref 6–23)
CO2: 27 mEq/L (ref 19–32)
Calcium: 7.8 mg/dL — ABNORMAL LOW (ref 8.4–10.5)
Chloride: 98 mEq/L (ref 96–112)
Creatinine, Ser: 5.5 mg/dL — ABNORMAL HIGH (ref 0.50–1.35)
GFR calc Af Amer: 12 mL/min — ABNORMAL LOW (ref >90)
GFR calc non Af Amer: 11 mL/min — ABNORMAL LOW (ref >90)
Glucose, Bld: 92 mg/dL (ref 70–99)
Potassium: 4.8 mEq/L (ref 3.7–5.3)
Sodium: 138 mEq/L (ref 137–147)

## 2014-09-11 SURGERY — COLONOSCOPY
Anesthesia: Monitor Anesthesia Care

## 2014-09-11 MED ORDER — PROPOFOL INFUSION 10 MG/ML OPTIME
INTRAVENOUS | Status: DC | PRN
  Administered 2014-09-11: 50 ug/kg/min via INTRAVENOUS

## 2014-09-11 MED ORDER — VANCOMYCIN HCL 500 MG IV SOLR
500.0000 mg | INTRAVENOUS | Status: DC | PRN
  Filled 2014-09-11: qty 500

## 2014-09-11 MED ORDER — FENTANYL CITRATE 0.05 MG/ML IJ SOLN
INTRAMUSCULAR | Status: DC | PRN
  Administered 2014-09-11: 50 ug via INTRAVENOUS

## 2014-09-11 MED ORDER — OXYCODONE HCL 5 MG PO TABS
ORAL_TABLET | ORAL | Status: AC
  Administered 2014-09-11: 5 mg via ORAL
  Filled 2014-09-11: qty 1

## 2014-09-11 MED ORDER — MIDAZOLAM HCL 5 MG/5ML IJ SOLN
INTRAMUSCULAR | Status: DC | PRN
  Administered 2014-09-11: 1 mg via INTRAVENOUS

## 2014-09-11 MED ORDER — VANCOMYCIN HCL 500 MG IV SOLR
500.0000 mg | INTRAVENOUS | Status: AC
  Administered 2014-09-11: 500 mg via INTRAVENOUS
  Filled 2014-09-11: qty 500

## 2014-09-11 MED ORDER — PHENYLEPHRINE HCL 10 MG/ML IJ SOLN
INTRAMUSCULAR | Status: DC | PRN
  Administered 2014-09-11: 80 ug via INTRAVENOUS

## 2014-09-11 NOTE — Progress Notes (Signed)
PT Cancellation Note  Patient Details Name: TAKAHIRO GODINHO MRN: [id number] DOB: 1960-11-02   Cancelled Treatment:    Reason Eval/Treat Not Completed: Patient at procedure or test/unavailable.  Patient in HD this am.  Will return this PM for PT session as time allows.   Despina Pole 09/11/2014, 11:02 AM Carita Pian. Sanjuana Kava, [place] Pager [phone]

## 2014-09-11 NOTE — Progress Notes (Signed)
Colonoscopy demonstrated pandiverticulosis.  This is his probable source for bleeding.  There was no fresh or old blood in the colon.  Recommendations #1 regular diet #2 okay to resume Plavix if it is felt that he needs to resume this long-term

## 2014-09-11 NOTE — Interval H&P Note (Signed)
History and Physical Interval Note:  09/11/2014 12:24 PM  [name]  has presented today for surgery, with the diagnosis of Hematochezia and anemia  The various methods of treatment have been discussed with the patient and family. After consideration of risks, benefits and other options for treatment, the patient has consented to  Procedure(s): COLONOSCOPY (N/A) as a surgical intervention .  The patient's history has been reviewed, patient examined, no change in status, stable for surgery.  I have reviewed the patient's chart and labs.  Questions were answered to the patient's satisfaction.    The recent H&P (dated *09/10/14*) was reviewed, the patient was examined and there is no change in the patients condition since that H&P was completed.   Erskine Emery  09/11/2014, 12:24 PM    Erskine Emery

## 2014-09-11 NOTE — H&P (View-Only) (Signed)
Progress Note for [facility]  Subjective: No acute events.  Objective: Vital signs in last 24 hours: Temp:  [98 F (36.7 C)-99.3 F (37.4 C)] 98.3 F (36.8 C) (12/20 0459) Pulse Rate:  [79-88] 88 (12/20 0459) Resp:  [15-21] 18 (12/20 0459) BP: (131-210)/(41-107) 152/61 mmHg (12/20 0459) SpO2:  [95 %-100 %] 100 % (12/20 0459) Weight:  [45 kg (99 lb 3.3 oz)] 45 kg (99 lb 3.3 oz) (12/19 2047) Last BM Date: 09/10/14  Intake/Output from previous day: 12/19 0701 - 12/20 0700 In: 240 [P.O.:240] Out: 0  Intake/Output this shift:    General appearance: alert and no distress GI: soft, non-tender; bowel sounds normal; no masses,  no organomegaly  Lab Results:  Recent Labs  09/09/14 0426 09/09/14 1649 09/10/14 0433  WBC 10.1 10.5 21.1*  HGB 10.3* 9.1* 10.4*  HCT 34.0* 29.8* 33.6*  PLT 177 202 236   BMET  Recent Labs  09/08/14 2121 09/09/14 0426 09/09/14 1649  NA 141 140 141  K 4.0 4.0 3.9  CL 97 98 98  CO2 33* 32 29  GLUCOSE 85 60* 65*  BUN 20 25* 31*  CREATININE 5.55* 6.21* 7.03*  CALCIUM 8.6 8.0* 7.7*   LFT  Recent Labs  09/09/14 0426 09/09/14 1649  PROT 5.4*  --   ALBUMIN 1.8* 1.8*  AST 25  --   ALT 11  --   ALKPHOS 134*  --   BILITOT 0.8  --    PT/INR  Recent Labs  09/08/14 2121 09/09/14 0426  LABPROT 14.9 15.5*  INR 1.16 1.22   Hepatitis Panel No results for input(s): HEPBSAG, HCVAB, HEPAIGM, HEPBIGM in the last 72 hours. C-Diff No results for input(s): CDIFFTOX in the last 72 hours. Fecal Lactopherrin No results for input(s): FECLLACTOFRN in the last 72 hours.  Studies/Results: No results found.  Medications:  Scheduled: . amLODipine  10 mg Oral Daily  . atorvastatin  40 mg Oral q1800  . calcitRIOL  2.75 mcg Oral Q T,Th,Sa-HD  . calcium acetate  2,001 mg Oral TID WC  . carvedilol  6.25 mg Oral BID WC  . cinacalcet  60 mg Oral Q breakfast  . [START ON 09/11/2014] colchicine  0.3 mg Oral Once per day on Mon Thu  . [START ON  09/14/2014] darbepoetin (ARANESP) injection - DIALYSIS  60 mcg Intravenous Q Thu-HD  . feeding supplement (RESOURCE BREEZE)  1 Container Oral TID BM  . [START ON 09/12/2014] ferric gluconate (FERRLECIT/NULECIT) IV  125 mg Intravenous Q T,Th,Sa-HD  . pantoprazole (PROTONIX) IV  40 mg Intravenous Q12H  . predniSONE  5 mg Oral Daily  . sodium chloride  3 mL Intravenous Q12H   Continuous: . sodium chloride 20 mL/hr at 09/09/14 2210    Assessment/Plan: 1) Hematochezia. 2) Anemia.   The patient was sitting in a pool of semi-solid stool.  No evidence of any blood.  He only drank half of the prep.  The colonoscopy was cancelled.  Plan: 1) Reschedule for the colonoscopy tomorrow with [facility]. 2) Follow HGB and transfuse as necessary.   LOS: 2 days   Tom Ragsdale D 09/10/2014, 8:54 AM

## 2014-09-11 NOTE — Transfer of Care (Signed)
Immediate Anesthesia Transfer of Care Note  Patient: [name]  Procedure(s) Performed: Procedure(s): COLONOSCOPY (N/A)  Patient Location: PACU and Endoscopy Unit  Anesthesia Type:MAC  Level of Consciousness: awake, sedated and patient cooperative  Airway & Oxygen Therapy: Patient Spontanous Breathing and Patient connected to nasal cannula oxygen  Post-op Assessment: Report given to PACU RN, Post -op Vital signs reviewed and stable and BP a little low; Dr. Tobias Alexander aware.  Post vital signs: Reviewed and stable  Complications: No apparent anesthesia complications

## 2014-09-11 NOTE — Progress Notes (Signed)
PATIENT DETAILS Name: [name] Age: 53 y.o. Sex: male Date of Birth: Nov 07, 1960 Admit Date: 09/08/2014 Admitting Physician Berle Mull, MD ATF:[email or web address],YHCWC L, MD  Brief narrative: 53 year old male patient with known vascular disease, chronic kidney disease on dialysis, hypertension, chronic steroids for gout brought to the emergency room on 08/16/14 with severe right leg pain, evaluated by vascular surgery, and underwent right femoral to below the knee popliteal artery bypass graft on 11/27. Subsequently patient developed significant left leg swelling, with concerns for left venous stenosis related to his left thigh arteriovenous graft, on 12/3 patient underwent left thigh shuntogram with subsequent venoplasty and stenting of the left common femoral vein.Previous left foot swelling improved remarkably after this procedure. On 12/5 patient developed significant hematochezia, unfortunately developed hemorrhagic shock and required ICU transfer. He received a total of 11 units of PRBC, 4 units of FFP and temporarily required pressors to maintain blood pressure. Aspirin/Plavix were placed on hold. He slowly stabilized, hematochezia completely resolved, on 12/11 he started having brown bowel movements. GI planNED to do colonoscopy, however patient refused this bleeding had resolved.  Aspirin and Plavix were subsequently resumed, and patient was discharged home on 09/05/14. Unfortunately on 09/08/14, he had numerous recurrent hematochezia and presented back to the emergency room and was subsequently admitted.  Subjective No hematochezia per patient. No other complaints.  Assessment/Plan: Principal Problem:   Lower GI bleed: Suspect this is secondary to diverticular disease. Prior GI bleeding in his previous admission was attributed it to probably ischemic colitis, he had refused colonoscopy that time. GI consulted, plans are for colonoscopy on 12/22.No hematochezia overnight.  Active  Problems:   Mild acute blood loss anemia: Secondary to above, although hemoglobin was 12.3 on admission, hemoglobin range has been close to his recent baseline from last admission. Monitor CBC, transfuse as needed.   Infected Left Great BJS:EGBT great toe with purulent foul smelling drainage-suspect likely cultprit for leukocytosis. Xray shows possible early gas gangrene, on empiric Vanco/Zosyn. Await blood culture, VVS following and contemplating amputation either today or tomorrow.Await repeat CBC-this am-but remains afebrile, does not look toxic as well.      ESRD on hemodialysis: Nephrology consulted for dialysis. Regular schedule is TTS    History of CAD with prior drug-eluting stent April 2015:no acute cardiac issues, given recurrent hematochezia aspirin/Plavix currently on hold. Will need to discuss with GI/await results of colonoscopy prior to resuming.     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    History of hypertension: Currently controlled, cautiously continue with Coreg and amlodipine. If significant hematochezia results then we'll need to stop antihypertensives.    History of gout: Continue with chronic prednisone and colchicine     Penile lesion:Likely necroses area secondary to known vascular diease and recent hemorrhagic shock. Spoke with Dr Matilde Sprang Urology on call on 12/21 who reviewed chart and picture, and recommendedaily wound care and to apply bacitracin.  See picture below. Will d/w Urology on call-suspect related to scab from most recent infection.He thought the area will demarcate and heal slowly    Deconditioning/generalized weakness: Secondary to recent prolonged  hospitalization/acute illness-PT evaluation.    Severe protein calorie malnutrition: Continue with supplements  Disposition: Remain inpatient  Antibiotics:  See below   Anti-infectives    Start     Dose/Rate Route Frequency Ordered Stop   09/12/14 1200  vancomycin (VANCOCIN) 500 mg in sodium chloride 0.9 % 100 mL IVPB     500 mg100 mL/hr over 60 Minutes Intravenous Every T-Th-Sa (Hemodialysis) 09/10/14 1035     09/10/14 1400  clindamycin (CLEOCIN) IVPB 600 mg     600 mg100 mL/hr over 30 Minutes Intravenous 3 times per day 09/10/14 1047 09/11/14 2359   09/10/14 1200  vancomycin (VANCOCIN) IVPB 1000 mg/200 mL premix     1,000 mg200 mL/hr over 60 Minutes Intravenous  Once 09/10/14 1035 09/10/14 1215   09/10/14 1200  piperacillin-tazobactam (ZOSYN) IVPB 2.25 g     2.25 g100 mL/hr over 30 Minutes Intravenous Every 8 hours 09/10/14 1035        DVT Prophylaxis: SCD's  Code Status: Full code   Family Communication None at bedside  Procedures:  None  CONSULTS:  nephrology  Time spent 40 minutes-which includes 50% of the time with face-to-face with patient/ family and coordinating care related to the above assessment and plan.  MEDICATIONS: Scheduled Meds: . amLODipine  10 mg Oral Daily  . atorvastatin  40 mg Oral q1800  . bacitracin   Topical BID  . calcitRIOL  2.75 mcg Oral Q T,Th,Sa-HD  . calcium acetate  2,001 mg Oral TID WC  . carvedilol  6.25 mg Oral BID WC  . cinacalcet  60 mg Oral Q breakfast  . clindamycin (CLEOCIN) IV  600 mg Intravenous 3 times per day  . colchicine  0.3 mg Oral Once per day on Mon Thu  . [START ON 09/14/2014] darbepoetin (ARANESP) injection - DIALYSIS  60 mcg Intravenous Q Thu-HD  . feeding supplement (RESOURCE BREEZE)  1 Container Oral TID BM  . [START ON 09/12/2014] ferric gluconate (FERRLECIT/NULECIT) IV  125 mg Intravenous Q T,Th,Sa-HD  . pantoprazole (PROTONIX) IV  40 mg Intravenous Q12H  . piperacillin-tazobactam (ZOSYN)  IV  2.25 g  Intravenous Q8H  . predniSONE  5 mg Oral Daily  . sodium chloride  3 mL Intravenous Q12H  . [START ON 09/12/2014] vancomycin  500 mg Intravenous Q T,Th,Sa-HD   Continuous Infusions: . sodium chloride 20 mL/hr at 09/09/14 2210   PRN Meds:.acetaminophen, acetaminophen, metoCLOPramide, ondansetron **OR** ondansetron (ZOFRAN) IV, oxyCODONE    PHYSICAL EXAM: Vital signs in last 24 hours: Filed Vitals:   09/10/14 1700 09/10/14 2100 09/11/14 0500 09/11/14 0944  BP: 97/26 135/32 111/31 92/50  Pulse: 75 62 67 66  Temp: 97.8 F (36.6 C) 97.8 F (36.6 C) 97.8 F (36.6 C) 97.6 F (36.4 C)  TempSrc: Oral Oral Oral Oral  Resp: 21 18 18 18   Height:      Weight:  45.8 kg (100 lb 15.5 oz)    SpO2: 96% 95% 98% 96%    Weight change: 0.8 kg (1 lb 12.2 oz) Filed Weights   09/09/14 1624 09/09/14 2047 09/10/14 2100  Weight: 45 kg (99 lb 3.3 oz) 45 kg (99 lb 3.3 oz) 45.8 kg (100 lb 15.5 oz)   Body mass index is 13.69 kg/(m^2).   Gen Exam: Awake and alert with clear speech.   Neck: Supple, No JVD.   Chest: B/L Clear.   CVS: S1 S2 Regular, no murmurs.  Abdomen: soft, BS +, non tender, non distended.  Extremities: no edema, lower extremities warm to touch.Foul smelling ulcer in left foot with purulent drainage.Dry gangrene in several  toes in right foot Neurologic: Non Focal.   Skin: No Rash.   Wounds: see below     Right Foot with dry gangrene (below)   Left great toe (below)   Intake/Output from previous day:  Intake/Output Summary (Last 24 hours) at 09/11/14 0956 Last data filed at 09/10/14 2137  Gross per 24 hour  Intake    243 ml  Output      0 ml  Net    243 ml     LAB RESULTS: CBC  Recent Labs Lab 09/05/14 0700 09/08/14 2121 09/09/14 0426 09/09/14 1649 09/10/14 0433  WBC 11.4* 10.2 10.1 10.5 21.1*  HGB 10.3* 12.3* 10.3* 9.1* 10.4*  HCT 33.0* 40.0 34.0* 29.8* 33.6*  PLT 158 206 177 202 236  MCV 88.9 91.7 91.4 90.0 89.4  MCH 27.8 28.2 27.7 27.5 27.7  MCHC  31.2 30.8 30.3 30.5 31.0  RDW 16.5* 16.6* 16.6* 16.4* 16.7*  LYMPHSABS  --  1.5  --   --   --   MONOABS  --  1.3*  --   --   --   EOSABS  --  0.3  --   --   --   BASOSABS  --  0.0  --   --   --     Chemistries   Recent Labs Lab 09/05/14 0700 09/08/14 2121 09/09/14 0426 09/09/14 1649  NA 140 141 140 141  K 4.0 4.0 4.0 3.9  CL 100 97 98 98  CO2 27 33* 32 29  GLUCOSE 78 85 60* 65*  BUN 18 20 25* 31*  CREATININE 5.35* 5.55* 6.21* 7.03*  CALCIUM 7.3* 8.6 8.0* 7.7*    CBG:  Recent Labs Lab 09/04/14 1705 09/05/14 0018 09/05/14 0604 09/05/14 1212 09/05/14 1604  GLUCAP 118* 117* 78 75 82    GFR Estimated Creatinine Clearance: 7.7 mL/min (by C-G formula based on Cr of 7.03).  Coagulation profile  Recent Labs Lab 09/08/14 2121 09/09/14 0426  INR 1.16 1.22    Cardiac Enzymes No results for input(s): CKMB, TROPONINI, MYOGLOBIN in the last 168 hours.  Invalid input(s): CK  Invalid input(s): POCBNP No results for input(s): DDIMER in the last 72 hours. No results for input(s): HGBA1C in the last 72 hours. No results for input(s): CHOL, HDL, LDLCALC, TRIG, CHOLHDL, LDLDIRECT in the last 72 hours. No results for input(s): TSH, T4TOTAL, T3FREE, THYROIDAB in the last 72 hours.  Invalid input(s): FREET3 No results for input(s): VITAMINB12, FOLATE, FERRITIN, TIBC, IRON, RETICCTPCT in the last 72 hours. No results for input(s): LIPASE, AMYLASE in the last 72 hours.  Urine Studies No results for input(s): UHGB, CRYS in the last 72 hours.  Invalid input(s): UACOL, UAPR, USPG, UPH, UTP, UGL, UKET, UBIL, UNIT, UROB, ULEU, UEPI, UWBC, URBC, UBAC, CAST, UCOM, BILUA  MICROBIOLOGY: No results found for this or any previous visit (from the past 240 hour(s)).  RADIOLOGY STUDIES/RESULTS: Dg Chest 2 View  09/10/2014   CLINICAL DATA:  Leukocytosis  EXAM: CHEST  2 VIEW  COMPARISON:  08/29/2014  FINDINGS: Bilateral mild apical pleural calcifications. Left apical calcifications  which are new compared with prior chest x-ray dated 07/01/2012. There is a small-moderate left pleural effusion. There is no right pleural effusion. There is no pneumothorax. The cardiomediastinal silhouette is stable. No acute osseous abnormality.  IMPRESSION: 1. Small either moderate left pleural effusion. Left apical calcifications again noted which are new since the prior exam of 07/01/2012. These are likely postinflammatory but can be seen in  the setting of tuberculosis. Correlate with PPD or QuantiFERON test.   Electronically Signed   By: Kathreen Devoid   On: 09/10/2014 10:35   Ct Abdomen Pelvis W Contrast  08/27/2014   CLINICAL DATA:  Acute onset of bright red blood per rectum, with periumbilical cramping abdominal pain. Initial encounter.  EXAM: CT ABDOMEN AND PELVIS WITH CONTRAST  TECHNIQUE: Multidetector CT imaging of the abdomen and pelvis was performed using the standard protocol following bolus administration of intravenous contrast.  CONTRAST:  183mL OMNIPAQUE IOHEXOL 300 MG/ML  SOLN  COMPARISON:  CT of the abdomen and pelvis from 11/10/2012  FINDINGS: Trace bilateral pleural effusions are seen, with bibasilar atelectasis.  A nonspecific 6 mm hypodensity within the left hepatic lobe is of uncertain significance. The liver is otherwise unremarkable. The spleen is mildly bulky, but remains normal in length. There is persistent chronic gallbladder wall thickening. The gallbladder is difficult to fully assess given trace ascites tracking about the liver.  The pancreas and adrenal glands are unremarkable.  The patient's native kidneys are markedly atrophic, with scattered bilateral renal cysts of varying size and attenuation. Scattered associated calcifications are seen. There is no evidence of hydronephrosis. No obstructing renal stones are seen.  There is marked fatty infiltration with regard to the transplant kidney at the left iliac fossa, with marked thinning of the renal parenchyma. Diffuse  associated vascular calcifications are seen. This appearance is stable from 2014.  There is mild diffuse mesenteric and omental edema, nonspecific in appearance.  The stomach is within normal limits. No acute vascular abnormalities are seen. Diffuse calcification is seen along the abdominal aorta and its branches. Extensive diffuse vascular calcification is noted throughout the abdomen and pelvis.  The patient appears to be status post resection of much of the bowel. Remaining small bowel is grossly unremarkable. A distended anastomosis is noted at the right hemipelvis; this corresponds to the site of the prior right pelvic catheter, which has been removed.  The appendix is not definitely characterized; there is no evidence for appendicitis. Contrast progresses to the level of the proximal sigmoid colon. There is scattered diverticulosis along the transverse colon. Stool is noted filling the distal sigmoid colon and rectum.  There is soft tissue inflammation about the distal sigmoid colon and rectum, raising question for proctitis. Diffuse presacral stranding is also noted, new from the prior study.  The bladder is not well assessed. Soft tissue density about the expected location of the bladder is relatively stable in appearance. 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 This is perhaps slightly more prominent than on the prior study. No inguinal lymphadenopathy is seen.  No acute osseous abnormalities are identified.  IMPRESSION: 1. Soft tissue inflammation about the distal sigmoid colon and rectum, with diffuse presacral stranding, new from the prior study. This raises concern for proctitis, which may explain the patient's bright red blood per rectum. Stool noted filling the distal sigmoid colon and rectum. 2. Scattered diverticulosis along the transverse colon, without evidence of diverticulitis; the colon is otherwise  grossly unremarkable. 3. Trace bilateral pleural effusions, with bibasilar atelectasis. 4. Marked atrophy of the patient's bilateral native kidneys, with bilateral renal cysts and scattered calcifications. Marked fatty infiltration of the transplant kidney at the left iliac fossa, with marked thinning of the renal parenchyma. This appearance is stable from 2014. 5. Stable appearance to soft tissue density about the expected location of the bladder. 6.  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 7. Diffuse calcification along the abdominal aorta and its branches. Extensive diffuse vascular calcification seen throughout the abdomen and pelvis. 8. Nonspecific 6 mm hypodensity within the left hepatic lobe, new from prior studies. 9. Persistent chronic gallbladder wall thickening; gallbladder difficult to fully assess given trace ascites about the liver, but this appearance is relatively stable. 10. Mild nonspecific diffuse mesenteric and omental edema.   Electronically Signed   By: Garald Balding M.D.   On: 08/27/2014 01:30   Dg Chest Port 1 View  08/29/2014   CLINICAL DATA:  Central line placement.  EXAM: PORTABLE CHEST - 1 VIEW  COMPARISON:  07/03/2012  FINDINGS: Interval placement of a left central venous catheter. Tip projects to the left mediastinum over the aortic arch. This could be in the brachiocephalic vein, in a normal variant left superior vena cava, or an arterial branch. No pneumothorax. Calcification in the left apex is likely postinflammatory. This is new since previous study. Heart size is normal. Hemidiaphragms are not included within the field of view.  IMPRESSION: Nonspecific placement of left central venous catheter to the left of midline, suggesting placement either in the brachiocephalic vein, left superior vena cava, or arterial branch. No pneumothorax.   Electronically  Signed   By: Lucienne Capers M.D.   On: 08/29/2014 02:17   Dg Abd Portable 1v  08/26/2014   CLINICAL DATA:  Acute GI bleed  EXAM: PORTABLE ABDOMEN - 1 VIEW  COMPARISON:  CT abdomen pelvis dated 11/10/2012  FINDINGS: Nonspecific but nonobstructive bowel gas pattern.  Surgical clips overlying the pelvis.  Vascular calcifications.  IMPRESSION: Unremarkable abdominal radiograph.   Electronically Signed   By: Julian Hy M.D.   On: 08/26/2014 22:30   Dg Toe Great Left  09/10/2014   ADDENDUM REPORT: 09/10/2014 10:44  ADDENDUM: These results were called by telephone at the time of interpretation on 09/10/2014 at 10:44 am to Dr. Oren Binet , who verbally acknowledged these results.   Electronically Signed   By: Lowella Grip M.D.   On: 09/10/2014 10:44   09/10/2014   CLINICAL DATA:  Loss of blood flow to first toe ; pain  EXAM: LEFT FIRST TOE  COMPARISON:  None.  FINDINGS: Frontal, oblique, and lateral views were obtained. There is soft tissue swelling along the dorsal distal aspect of the first digit. There are scattered foci of air in the soft tissues, concerning for potential soft tissue infection in this area. There is no erosive change or bony destruction. No fracture or dislocation. Joint spaces appear intact. There is extensive arterial vascular calcification.  IMPRESSION: Soft tissue air is noted in the distal first digit. This finding is concerning for potential infection. Early gas gangrene could present in this manner and must be of concern. No bony destruction. No fracture or dislocation. Extensive arterial vascular calcification. Suspect diabetes mellitus.  Electronically Signed: By: Lowella Grip M.D. On: 09/10/2014 10:39    Oren Binet, MD  [facility] Pager:[phone]  If 7PM-7AM, please contact night-coverage [email or web address] Password [email or web address] 09/11/2014, 9:56 AM   LOS: 3 days

## 2014-09-11 NOTE — Progress Notes (Signed)
PT Cancellation Note  Patient Details Name: MCKADE GURKA MRN: [id number] DOB: 05-31-1961   Cancelled Treatment:    Reason Eval/Treat Not Completed: Patient at procedure or test/unavailable.  Patient remains in HD this pm.  Will return tomorrow for PT session.   Despina Pole 09/11/2014, 4:14 PM Carita Pian. Sanjuana Kava, [place] Pager [phone]

## 2014-09-11 NOTE — Anesthesia Postprocedure Evaluation (Signed)
  Anesthesia Post-op Note  Patient: [name]  Procedure(s) Performed: Procedure(s): COLONOSCOPY (N/A)  Patient Location: PACU and Endoscopy Unit  Anesthesia Type:MAC  Level of Consciousness: awake, sedated and patient cooperative  Airway and Oxygen Therapy: Patient Spontanous Breathing and Patient connected to nasal cannula oxygen  Post-op Pain: none  Post-op Assessment: Post-op Vital signs reviewed, Patient's Cardiovascular Status Stable, Respiratory Function Stable, Patent Airway and No signs of Nausea or vomiting  Post-op Vital Signs: Reviewed and stable  Last Vitals:  Filed Vitals:   09/11/14 1350  BP:   Pulse: 66  Temp:   Resp: 19    Complications: No apparent anesthesia complications

## 2014-09-11 NOTE — Procedures (Signed)
I have seen and examined this patient and agree with the plan of care   Patient seen on dilaysis  BP 130/70   Goal 1 L   No edema noted and no shortness of breath   sats 100%    [name] W 09/11/2014, 4:06 PM

## 2014-09-11 NOTE — Anesthesia Postprocedure Evaluation (Signed)
Anesthesia Post Note  Patient: [name]  Procedure(s) Performed: Procedure(s) (LRB): COLONOSCOPY (N/A)  Anesthesia type: MAC  Patient location: PACU  Post pain: Pain level controlled  Post assessment: Patient's Cardiovascular Status Stable  Last Vitals:  Filed Vitals:   09/11/14 1350  BP:   Pulse: 66  Temp:   Resp: 19    Post vital signs: Reviewed and stable  Level of consciousness: sedated  Complications: No apparent anesthesia complications

## 2014-09-11 NOTE — Progress Notes (Signed)
ANTIBIOTIC CONSULT NOTE - FOLLOW UP  Pharmacy Consult for Vancomycin/Zosyn Indication: toe infection  Allergies  Allergen Reactions  . Allopurinol Other (See Comments)    REACTION: decreased platelets    Patient Measurements: Height: 6' (182.9 cm) Weight: 100 lb 15.5 oz (45.8 kg) IBW/kg (Calculated) : 77.6  Vital Signs: Temp: 97.6 F (36.4 C) (12/21 0944) Temp Source: Oral (12/21 0944) BP: 92/50 mmHg (12/21 0944) Pulse Rate: 66 (12/21 0944) Intake/Output from previous day: 12/20 0701 - 12/21 0700 In: 363 [P.O.:360; I.V.:3] Out: -  Intake/Output from this shift:    Labs:  Recent Labs  09/09/14 0426 09/09/14 1649 09/10/14 0433 09/11/14 0919  WBC 10.1 10.5 21.1* 10.9*  HGB 10.3* 9.1* 10.4* 9.5*  PLT 177 202 236 160  CREATININE 6.21* 7.03*  --  5.50*   Estimated Creatinine Clearance: 9.8 mL/min (by C-G formula based on Cr of 5.5). No results for input(s): VANCOTROUGH, VANCOPEAK, VANCORANDOM, GENTTROUGH, GENTPEAK, GENTRANDOM, TOBRATROUGH, TOBRAPEAK, TOBRARND, AMIKACINPEAK, AMIKACINTROU, AMIKACIN in the last 72 hours.   Microbiology: Recent Results (from the past 720 hour(s))  Surgical pcr screen     Status: None   Collection Time: 08/26/14  5:20 PM  Result Value Ref Range Status   MRSA, PCR NEGATIVE NEGATIVE Final   Staphylococcus aureus NEGATIVE NEGATIVE Final    Comment:        The Xpert SA Assay (FDA approved for NASAL specimens in patients over 39 years of age), is one component of a comprehensive surveillance program.  Test performance has been validated by [employer] for patients greater than or equal to 28 year old. It is not intended to diagnose infection nor to guide or monitor treatment.   Culture, blood (routine x 2)     Status: None (Preliminary result)   Collection Time: 09/10/14 12:20 PM  Result Value Ref Range Status   Specimen Description BLOOD RIGHT WRIST  Final   Special Requests BOTTLES DRAWN AEROBIC ONLY Kevin  Final   Culture   Setup Time   Final    09/10/2014 18:32 Performed at [facility]    Culture   Final           BLOOD CULTURE RECEIVED NO GROWTH TO DATE CULTURE WILL BE HELD FOR 5 DAYS BEFORE ISSUING A FINAL NEGATIVE REPORT Performed at [facility]    Report Status PENDING  Incomplete  Culture, blood (routine x 2)     Status: None (Preliminary result)   Collection Time: 09/10/14 12:30 PM  Result Value Ref Range Status   Specimen Description BLOOD LEFT WRIST  Final   Special Requests BOTTLES DRAWN AEROBIC ONLY 10CC  Final   Culture  Setup Time   Final    09/10/2014 18:32 Performed at [facility]    Culture   Final           BLOOD CULTURE RECEIVED NO GROWTH TO DATE CULTURE WILL BE HELD FOR 5 DAYS BEFORE ISSUING A FINAL NEGATIVE REPORT Performed at [facility]    Report Status PENDING  Incomplete    Anti-infectives    Start     Dose/Rate Route Frequency Ordered Stop   09/12/14 1200  vancomycin (VANCOCIN) 500 mg in sodium chloride 0.9 % 100 mL IVPB  Status:  Discontinued     500 mg100 mL/hr over 60 Minutes Intravenous Every T-Th-Sa (Hemodialysis) 09/10/14 1035 09/11/14 1120   09/11/14 1200  vancomycin (VANCOCIN) 500 mg in sodium chloride 0.9 % 100 mL IVPB  Status:  Discontinued  500 mg100 mL/hr over 60 Minutes Intravenous Every Dialysis 09/11/14 1120 09/11/14 1122   09/11/14 1200  vancomycin (VANCOCIN) 500 mg in sodium chloride 0.9 % 100 mL IVPB     500 mg100 mL/hr over 60 Minutes Intravenous Every Mon (Hemodialysis) 09/11/14 1128 09/18/14 1159   09/11/14 1121  vancomycin (VANCOCIN) 500 mg in sodium chloride 0.9 % 100 mL IVPB  Status:  Discontinued     500 mg100 mL/hr over 60 Minutes Intravenous Every Dialysis 09/11/14 1122 09/11/14 1123   09/10/14 1400  clindamycin (CLEOCIN) IVPB 600 mg     600 mg100 mL/hr over 30 Minutes Intravenous 3 times per day 09/10/14 1047 09/11/14 2359   09/10/14 1200  vancomycin (VANCOCIN) IVPB 1000 mg/200 mL premix     1,000 mg200  mL/hr over 60 Minutes Intravenous  Once 09/10/14 1035 09/10/14 1215   09/10/14 1200  piperacillin-tazobactam (ZOSYN) IVPB 2.25 g     2.25 g100 mL/hr over 30 Minutes Intravenous Every 8 hours 09/10/14 1035        Assessment: 53 yo m admitted on 12/19 for GI bleed.  Patient with ESRD on HD TTS. Pharmacy is consulted to begin vancomycin/zosyn for L great toe infection.  Patient is scheduled for colonoscopy today followed by HD later on this afternoon/evening.  Wbc 10.9 (decrease from 21.1), afebrile, CrCl ~ 9 ml/min.  Vanc 12/20 >> Zosyn 12/20 >> Clindamycin 12/20 >> 12/22  12/20 Bld Cx: NGTD  Goal of Therapy:  Vancomycin trough level 10-15 mcg/ml  Plan:  Give vancomycin 500 mg IV qHD today Continue zosyn 2.25 gm IV q8hrs F/u HD schedule and scheduling of abx Monitor CBC, cultures, HD schedule, temperature curve, clinical course  Evangelene Vora L. Nicole Kindred, PharmD Clinical Pharmacy Resident Pager: [phone] 09/11/2014 11:34 AM

## 2014-09-11 NOTE — Progress Notes (Signed)
[facility] Progress Note  Assessment/Plan: 1. Recurrent GIB; had last admission thought to have been secondary to ischemic colitis; again - possibly ischemic - given severe vascular disease - no noted BP drops during HD; keep systolic BP >614; GI planning colonoscopy today 2. ESRD - TTS - HD Saturday - kept even - HD planned for today (Mon) after colonoscopy, though could postpone until Tuesday - labs pending 3. Hypertension/volume - BP lower today- not clear why -hope not related to infection keep > 130 on HD to avoid ischemia; on norvasc 10/carvedilol6.25 bid - parameters written for holding meds 4. Anemia - slight decline in Hgb; dropped to 9.1 pre HD Sat up to 10.4 post HD Sunday am - ^ Fe (125 x 4) ESA per routine- repeat CBC pending 5. Metabolic bone disease - controlled with current calcitiriol/ sensipar/binders dose, Corr Ca/P ok 6. Protein calorie malnutrition - Alb 1.8 added Resource supplements to CL, now NPO except drinking prep 7. Severe PVD s/o recent bilateral fem pops in Oct and Nov with nonhealing foot wounds; follow conservatively 8. Hep C + 9. Chronic steroid use - for gout 10. Leukocytosis - WBC in the 10s - jumped to 21 K Sat pm - tmax 99.3- left foot infection most likely -  culture, start empiric Vanc, Cleocin and Zosyn- repeat CBC pending 11. Multiple wounds - ulcer tip of penis, right foot toes/distal foot dry gangrene; left great toe, gangrene-VVS following; pt know let foot surgery may be needed  [name], PA-C [place] [phone] 09/11/2014,10:00 AM  LOS: 3 days   Subjective:   Still drinking nulytley- hopes they don't postpone colonoscopy  Objective Filed Vitals:   09/10/14 1700 09/10/14 2100 09/11/14 0500 09/11/14 0944  BP: 97/26 135/32 111/31 92/50  Pulse: 75 62 67 66  Temp: 97.8 F (36.6 C) 97.8 F (36.6 C) 97.8 F (36.6 C) 97.6 F (36.4 C)  TempSrc: Oral Oral Oral Oral  Resp: 21 18 18 18    Height:      Weight:  45.8 kg (100 lb 15.5 oz)    SpO2: 96% 95% 98% 96%   Physical Exam General:emaciated NAD, alert Heart: RRR Lungs: no rales Abdomen: soft Extremities: right foot mild edema, gangrenous toes; left foot also mild edema ^ warm, scant foul smelling drainage left great toe Dialysis Access: left thigh AVGG  Dialysis Orders: TTS @ [facility] 180 3:30 46.5 kg 2K/2Ca 400/800 Profile 2 Heparin none L thigh AVG  Calcitriol 2.75 mcg Aranesp 60 mcg on Thurs Venofer 50 per week Recent labs from 12/17 Hgb 11.3 Fe 22, 27% sat K 3.9 Ca 9.4 P 3 Alb 2.9 iPTH 217 WBC 11.56   Additional Objective Labs: Basic Metabolic Panel:  Recent Labs Lab 09/05/14 0700 09/08/14 2121 09/09/14 0426 09/09/14 1649  NA 140 141 140 141  K 4.0 4.0 4.0 3.9  CL 100 97 98 98  CO2 27 33* 32 29  GLUCOSE 78 85 60* 65*  BUN 18 20 25* 31*  CREATININE 5.35* 5.55* 6.21* 7.03*  CALCIUM 7.3* 8.6 8.0* 7.7*  PHOS 2.8  --   --  4.9*   Liver Function Tests:  Recent Labs Lab 09/08/14 2121 09/09/14 0426 09/09/14 1649  AST 36 25  --   ALT 15 11  --   ALKPHOS 166* 134*  --   BILITOT 1.1 0.8  --   PROT 6.3 5.4*  --   ALBUMIN 2.2* 1.8* 1.8*   CBC:  Recent Labs Lab 09/05/14  0700 09/08/14 2121 09/09/14 0426 09/09/14 1649 09/10/14 0433  WBC 11.4* 10.2 10.1 10.5 21.1*  NEUTROABS  --  7.0  --   --   --   HGB 10.3* 12.3* 10.3* 9.1* 10.4*  HCT 33.0* 40.0 34.0* 29.8* 33.6*  MCV 88.9 91.7 91.4 90.0 89.4  PLT 158 206 177 202 236  CBG:  Recent Labs Lab 09/04/14 1705 09/05/14 0018 09/05/14 0604 09/05/14 1212 09/05/14 1604  GLUCAP 118* 117* 78 75 82  Studies/Results: Dg Chest 2 View  09/10/2014   CLINICAL DATA:  Leukocytosis  EXAM: CHEST  2 VIEW  COMPARISON:  08/29/2014  FINDINGS: Bilateral mild apical pleural calcifications. Left apical calcifications which are new compared with prior chest x-ray dated 07/01/2012. There is a small-moderate left pleural  effusion. There is no right pleural effusion. There is no pneumothorax. The cardiomediastinal silhouette is stable. No acute osseous abnormality.  IMPRESSION: 1. Small either moderate left pleural effusion. Left apical calcifications again noted which are new since the prior exam of 07/01/2012. These are likely postinflammatory but can be seen in the setting of tuberculosis. Correlate with PPD or QuantiFERON test.   Electronically Signed   By: Kathreen Devoid   On: 09/10/2014 10:35   Dg Toe Great Left  09/10/2014   ADDENDUM REPORT: 09/10/2014 10:44  ADDENDUM: These results were called by telephone at the time of interpretation on 09/10/2014 at 10:44 am to Dr. Oren Binet , who verbally acknowledged these results.   Electronically Signed   By: Lowella Grip M.D.   On: 09/10/2014 10:44   09/10/2014   CLINICAL DATA:  Loss of blood flow to first toe ; pain  EXAM: LEFT FIRST TOE  COMPARISON:  None.  FINDINGS: Frontal, oblique, and lateral views were obtained. There is soft tissue swelling along the dorsal distal aspect of the first digit. There are scattered foci of air in the soft tissues, concerning for potential soft tissue infection in this area. There is no erosive change or bony destruction. No fracture or dislocation. Joint spaces appear intact. There is extensive arterial vascular calcification.  IMPRESSION: Soft tissue air is noted in the distal first digit. This finding is concerning for potential infection. Early gas gangrene could present in this manner and must be of concern. No bony destruction. No fracture or dislocation. Extensive arterial vascular calcification. Suspect diabetes mellitus.  Electronically Signed: By: Lowella Grip M.D. On: 09/10/2014 10:39   Medications: . sodium chloride 20 mL/hr at 09/09/14 2210   . amLODipine  10 mg Oral Daily  . atorvastatin  40 mg Oral q1800  . bacitracin   Topical BID  . calcitRIOL  2.75 mcg Oral Q T,Th,Sa-HD  . calcium acetate  2,001 mg Oral  TID WC  . carvedilol  6.25 mg Oral BID WC  . cinacalcet  60 mg Oral Q breakfast  . clindamycin (CLEOCIN) IV  600 mg Intravenous 3 times per day  . colchicine  0.3 mg Oral Once per day on Mon Thu  . [START ON 09/14/2014] darbepoetin (ARANESP) injection - DIALYSIS  60 mcg Intravenous Q Thu-HD  . feeding supplement (RESOURCE BREEZE)  1 Container Oral TID BM  . [START ON 09/12/2014] ferric gluconate (FERRLECIT/NULECIT) IV  125 mg Intravenous Q T,Th,Sa-HD  . pantoprazole (PROTONIX) IV  40 mg Intravenous Q12H  . piperacillin-tazobactam (ZOSYN)  IV  2.25 g Intravenous Q8H  . predniSONE  5 mg Oral Daily  . sodium chloride  3 mL Intravenous Q12H  . [START ON 09/12/2014]  vancomycin  500 mg Intravenous Q T,Th,Sa-HD

## 2014-09-11 NOTE — Progress Notes (Signed)
INITIAL NUTRITION ASSESSMENT  DOCUMENTATION CODES Per approved criteria  -Underweight   INTERVENTION: Once diet advances, continue Resource Breeze po TID, each supplement provides 250 kcal and 9 grams of protein  NUTRITION DIAGNOSIS: Increased nutrient needs related to chronic illness, ESRD as evidenced by estimated nutrition needs.   Goal: Pt to meet >/= 90% of their estimated nutrition needs   Monitor:  Diet advancement, weight trends, labs, I/O's  Reason for Assessment: MST  53 y.o. male  Admitting Dx: GI bleed  ASSESSMENT: Pt with Past medical history of ESRD on hemodialysis Tuesday Thursday Saturday, hypertension, renal transplant patient, on chronic prednisone, chronic systolic heart failure, GERD, recent admission for GI bleed. Patient presents with complaints of bleeding in the bowels.  Procedure 12/21: Colonoscopy  Colonoscopy demonstrated pandiverticulosis.  Pt was in procedure during time of visit. Unable to obtain nutrition hx and perform nutrition focused physical exam. Will perform during next visit. Pt is currently NPO. Will continue with current interventions.  Height: Ht Readings from Last 1 Encounters:  09/08/14 6' (1.829 m)    Weight: Wt Readings from Last 1 Encounters:  09/10/14 100 lb 15.5 oz (45.8 kg)    Ideal Body Weight: 178 lbs  % Ideal Body Weight: 56%  Wt Readings from Last 10 Encounters:  09/10/14 100 lb 15.5 oz (45.8 kg)  09/05/14 101 lb 13.6 oz (46.2 kg)  08/15/14 110 lb (49.896 kg)  08/07/14 108 lb (48.988 kg)  08/02/14 110 lb (49.896 kg)  07/20/14 103 lb 9.9 oz (47 kg)  07/10/14 108 lb 8 oz (49.215 kg)  06/07/14 106 lb 1.6 oz (48.127 kg)  05/08/14 104 lb 9.6 oz (47.446 kg)  03/13/14 105 lb 11.2 oz (47.945 kg)    Usual Body Weight: 110 lbs  % Usual Body Weight: 91%  BMI:  Body mass index is 13.69 kg/(m^2). Underweight  Estimated Nutritional Needs: Kcal: 1800-2000 Protein: 75-95 grams Fluid: 1.2 L/day  Skin: Stage  II pressure ulcer on sacrum, incision on right leg, laceration on upper right great toe  Diet Order: Diet NPO time specified  EDUCATION NEEDS: -No education needs identified at this time   Intake/Output Summary (Last 24 hours) at 09/11/14 1239 Last data filed at 09/10/14 2137  Gross per 24 hour  Intake    243 ml  Output      0 ml  Net    243 ml    Last BM: 12/21  Labs:   Recent Labs Lab 09/05/14 0700  09/09/14 0426 09/09/14 1649 09/11/14 0919  NA 140  < > 140 141 138  K 4.0  < > 4.0 3.9 4.8  CL 100  < > 98 98 98  CO2 27  < > 32 29 27  BUN 18  < > 25* 31* 18  CREATININE 5.35*  < > 6.21* 7.03* 5.50*  CALCIUM 7.3*  < > 8.0* 7.7* 7.8*  PHOS 2.8  --   --  4.9*  --   GLUCOSE 78  < > 60* 65* 92  < > = values in this interval not displayed.  CBG (last 3)  No results for input(s): GLUCAP in the last 72 hours.  Scheduled Meds: . amLODipine  10 mg Oral Daily  . atorvastatin  40 mg Oral q1800  . bacitracin   Topical BID  . calcitRIOL  2.75 mcg Oral Q T,Th,Sa-HD  . calcium acetate  2,001 mg Oral TID WC  . carvedilol  6.25 mg Oral BID WC  . cinacalcet  60  mg Oral Q breakfast  . clindamycin (CLEOCIN) IV  600 mg Intravenous 3 times per day  . colchicine  0.3 mg Oral Once per day on Mon Thu  . [START ON 09/14/2014] darbepoetin (ARANESP) injection - DIALYSIS  60 mcg Intravenous Q Thu-HD  . feeding supplement (RESOURCE BREEZE)  1 Container Oral TID BM  . [START ON 09/12/2014] ferric gluconate (FERRLECIT/NULECIT) IV  125 mg Intravenous Q T,Th,Sa-HD  . pantoprazole (PROTONIX) IV  40 mg Intravenous Q12H  . piperacillin-tazobactam (ZOSYN)  IV  2.25 g Intravenous Q8H  . predniSONE  5 mg Oral Daily  . sodium chloride  3 mL Intravenous Q12H  . vancomycin  500 mg Intravenous Q Mon-HD    Continuous Infusions: . sodium chloride 20 mL/hr at 09/09/14 2210    Past Medical History  Diagnosis Date  . ESRD on hemodialysis     a. ESRD 2/2 HTN with renal transplant in 1987 (cadaveric)  after short period of dialysis;  b. Transplant failed in 2004 and he went back on HD;  c. As of 10/15 getting HD via L thigh AVG on a TTS schedule at [facility].  . Hypertension   . Hx of kidney transplant     a. 1987-> back on HD since 2004  . Gout tophi   . Chronic steroid use     a. Has severe gout. Did not tolerate Allopurinol. Do not taper per PCP   . Systolic CHF, chronic     2 D echo 04/2012 with EF of 45 %   . Malnutrition   . Hx SBO 04/2012    a. 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 F/U with Dr Donne Hazel. Started on TNA  . Anemia associated with chronic renal failure   . Hepatitis C   . GERD (gastroesophageal reflux disease)   . H/O hiatal hernia   . Arthritis   . History of DVT (deep vein thrombosis)   . Peripheral vascular disease     a. 12/2013 PTA of L Pop;  b. 12/2013 PTA R pop, R DP;  c. 06/2014 L Pop CBA/DCB PTA.  . Prostate cancer   . CAD (coronary artery disease)     a. 12/2013 Cath/PCI: EF 45-50%, LM Ca2+, LAD 30-40p, 53m, D1/2/3 min irregs, LCX 50ost, 30-27m, RCA 70p, 95/83m (Rota->3.0x23 Xience distal, 3.0x23 Xience mid, 3.0x28 Xience prox).  . Chronic diastolic CHF (congestive heart failure)     a. 12/2013 Echo: EF 55-60%, mild LVH, nl wall motion, Gr 2 DD.  . Dry gangrene     a. L great toe    Past Surgical History  Procedure Laterality Date  . Laparotomy  04/14/2012    Procedure: EXPLORATORY LAPAROTOMY;  Surgeon: Joyice Faster. Cornett, MD;  Location: [place];  Service: General;  Laterality: N/A;  . Colon resection  04/14/2012  . Laparotomy  04/22/2012    Procedure: EXPLORATORY LAPAROTOMY;  Surgeon: Adin Hector, MD;  Location: [place];  Service: General;  Laterality: N/A;  lysis of adhesions, omentoplasty, repair small bowel  . Thrombectomy w/ embolectomy  04/27/2012    Procedure: THROMBECTOMY ARTERIOVENOUS GORE-TEX GRAFT;  Surgeon: Angelia Mould, MD;  Location: [place];   Service: Vascular;  Laterality: Left;  Thrombectomy of left thigh arteriovenous gortex graft  . Prostectomy  2011  . Renal grafts    . Pelvic abcess drainage Right 6/14    removal drain s/p bowl  resection 13  . Transanal excision of rectal mass N/A 03/30/2013    Procedure: EXCISION OF anal MASS;  Surgeon: Joyice Faster. Cornett, MD;  Location: [place];  Service: General;  Laterality: N/A;  Exam under anesthesia with excision anal verge mass  . Aortogram  08/15/2014    abd aortogram  . Femoral-popliteal bypass graft Right 08/18/2014    Procedure: BYPASS GRAFT FEMORAL-POPLITEAL ARTERY with Gortex Graft;  Surgeon: Serafina Mitchell, MD;  Location: [place];  Service: Vascular;  Laterality: Right;  . Shuntogram Left 09/29/2012    Procedure: SHUNTOGRAM;  Surgeon: Serafina Mitchell, MD;  Location: [facility] CATH LAB;  Service: Cardiovascular;  Laterality: Left;  . Abdominal aortagram N/A 01/03/2014    Procedure: ABDOMINAL Maxcine Ham;  Surgeon: Serafina Mitchell, MD;  Location: [facility] CATH LAB;  Service: Cardiovascular;  Laterality: N/A;  . Abdominal aortagram Right 01/11/2014    Procedure: ABDOMINAL AORTAGRAM;  Surgeon: Serafina Mitchell, MD;  Location: [facility] CATH LAB;  Service: Cardiovascular;  Laterality: Right;  . Left heart catheterization with coronary angiogram N/A 01/12/2014    Procedure: LEFT HEART CATHETERIZATION WITH CORONARY ANGIOGRAM;  Surgeon: Leonie Man, MD;  Location: [facility] CATH LAB;  Service: Cardiovascular;  Laterality: N/A;  . Percutaneous coronary rotoblator intervention (pci-r) N/A 01/13/2014    Procedure: PERCUTANEOUS CORONARY ROTOBLATOR INTERVENTION (PCI-R);  Surgeon: Burnell Blanks, MD;  Location: [facility] CATH LAB;  Service: Cardiovascular;  Laterality: N/A;  . Abdominal aortagram N/A 07/19/2014    Procedure: ABDOMINAL AORTAGRAM;  Surgeon: Serafina Mitchell, MD;  Location: [facility] CATH LAB;  Service: Cardiovascular;  Laterality: N/A;  . Shuntogram N/A 08/24/2014    Procedure: Earney Mallet;  Surgeon: Conrad Mount Calm, MD;   Location: [facility] CATH LAB;  Service: Cardiovascular;  Laterality: N/A;    Kallie Locks, MS, RD, LDN Pager # [phone] After hours/ weekend pager # [phone]

## 2014-09-11 NOTE — Anesthesia Preprocedure Evaluation (Signed)
Anesthesia Evaluation    Reviewed: Allergy & Precautions, NPO status , Patient's Chart, lab work & pertinent test results  History of Anesthesia Complications Negative for: history of anesthetic complications  Airway        Dental   Pulmonary former smoker,          Cardiovascular hypertension, + angina + CAD and + Peripheral Vascular Disease  2 D echo 04/2012 with EF of 45 % 12/2013 Cath/PCI: EF 45-50%, LM Ca2+, LAD 30-40p, 28m, D1/2/3 min irregs, LCX 50ost, 30-31m, RCA 70p, 95/38m (Rota->3.0x23 Xience distal, 3.0x23 Xience mid, 3.0x28 Xience prox).    Neuro/Psych    GI/Hepatic hiatal hernia, GERD-  ,(+) Hepatitis -, C  Endo/Other    Renal/GU ESRF and DialysisRenal disease     Musculoskeletal   Abdominal   Peds  Hematology   Anesthesia Other Findings   Reproductive/Obstetrics                             Anesthesia Physical Anesthesia Plan  ASA: III  Anesthesia Plan: MAC   Post-op Pain Management:    Induction: Intravenous  Airway Management Planned: Simple Face Mask  Additional Equipment:   Intra-op Plan:   Post-operative Plan:   Informed Consent:   Plan Discussed with:   Anesthesia Plan Comments:         Anesthesia Quick Evaluation

## 2014-09-11 NOTE — Op Note (Signed)
[facility] [place], [id number]   COLONOSCOPY PROCEDURE REPORT     EXAM DATE: 09/11/2014  PATIENT NAME:      [name], [name]           MR #:      [id number]  BIRTHDATE:       05-11-1961      VISIT #:     [id number]  ATTENDING:     Inda Castle, MD     STATUS:     outpatient REFERRING MD: ASA CLASS:        Class III  INDICATIONS:  The patient is a 53 yr old male here for a colonoscopy due to hematochezia. PROCEDURE PERFORMED:     Colonoscopy, diagnostic MEDICATIONS:     Monitored anesthesia care ESTIMATED BLOOD LOSS:     None  CONSENT: The patient understands the risks and benefits of the procedure and understands that these risks include, but are not limited to: sedation, allergic reaction, infection, perforation and/or bleeding. Alternative means of evaluation and treatment include, among others: physical exam, x-rays, and/or surgical intervention. The patient elects to proceed with this endoscopic procedure.  DESCRIPTION OF PROCEDURE: During intra-op preparation period all mechanical & medical equipment was checked for proper function. Hand hygiene and appropriate measures for infection prevention was taken. After the risks, benefits and alternatives of the procedure were thoroughly explained, Informed consent was verified, confirmed and timeout was successfully executed by the treatment team. A digital exam revealed no abnormalities of the rectum.      The Pentax Adult Colon [id number] endoscope was introduced through the anus and advanced to the cecum, which was identified by both the appendix and ileocecal valve. No adverse events experienced. The prep was good, using MiraLax. The instrument was then slowly withdrawn as the colon was fully examined.   COLON FINDINGS: There was moderate diverticulosis noted throughout the entire examined colon.   The examination was otherwise normal. Retroflexion was not performed due  to a narrow rectal vault.  The scope was then completely withdrawn from the patient and the procedure terminated. WITHDRAWAL TIME: 7 minutes 0 seconds    ADVERSE EVENTS:      There were no immediate complications.  IMPRESSIONS:     1.  There was moderate diverticulosis noted throughout the entire examined colon 2.  The examination was otherwise normal  acute GI bleed probably secondary to diverticulosis  RECOMMENDATIONS:     stat CTA for acute bleeding RECALL:  Inda Castle, MD eSigned:  Inda Castle, MD 09/11/2014 1:35 PM   cc:  Mauricia Area, MD  CPT CODES: ICD CODES:  The ICD and CPT codes recommended by this software are interpretations from the data that the clinical staff has captured with the software.  The verification of the translation of this report to the ICD and CPT codes and modifiers is the sole responsibility of the health care institution and practicing physician where this report was generated.  [employer]. will not be held responsible for the validity of the ICD and CPT codes included on this report.  AMA assumes no liability for data contained or not contained herein. CPT is a Designer, television/film set of the [employer].

## 2014-09-11 NOTE — Anesthesia Postprocedure Evaluation (Deleted)
Anesthesia Post Note  Patient: [name]  Procedure(s) Performed: Procedure(s) (LRB): COLONOSCOPY (N/A)  Anesthesia type: MAC  Patient location: PACU  Post pain: Pain level controlled  Post assessment: Patient's Cardiovascular Status Stable  Last Vitals:  Filed Vitals:   09/11/14 1340  BP:   Pulse: 65  Temp:   Resp: 17    Post vital signs: Reviewed and stable  Level of consciousness: sedated  Complications: No apparent anesthesia complications

## 2014-09-12 ENCOUNTER — Encounter (HOSPITAL_COMMUNITY): Payer: Self-pay | Admitting: Gastroenterology

## 2014-09-12 ENCOUNTER — Inpatient Hospital Stay (HOSPITAL_COMMUNITY): Payer: Medicare Other | Admitting: Anesthesiology

## 2014-09-12 ENCOUNTER — Encounter (HOSPITAL_COMMUNITY)
Admission: EM | Disposition: A | Discharge status: Home Health | Payer: Self-pay | Source: Home / Self Care | Attending: Family Medicine

## 2014-09-12 DIAGNOSIS — K5791 Diverticulosis of intestine, part unspecified, without perforation or abscess with bleeding: Secondary | ICD-10-CM

## 2014-09-12 DIAGNOSIS — I70262 Atherosclerosis of native arteries of extremities with gangrene, left leg: Secondary | ICD-10-CM

## 2014-09-12 HISTORY — PX: AMPUTATION: SHX166

## 2014-09-12 LAB — RENAL FUNCTION PANEL
ALBUMIN: 1.7 g/dL — AB (ref 3.5–5.2)
ANION GAP: 12 (ref 5–15)
BUN: 6 mg/dL (ref 6–23)
CHLORIDE: 103 meq/L (ref 96–112)
CO2: 25 mmol/L (ref 19–32)
Calcium: 7.8 mg/dL — ABNORMAL LOW (ref 8.4–10.5)
Creatinine, Ser: 3.74 mg/dL — ABNORMAL HIGH (ref 0.50–1.35)
GFR calc Af Amer: 19 mL/min — ABNORMAL LOW (ref >90)
GFR, EST NON AFRICAN AMERICAN: 17 mL/min — AB (ref >90)
Glucose, Bld: 85 mg/dL (ref 70–99)
POTASSIUM: 4.4 mmol/L (ref 3.5–5.1)
Phosphorus: 3.3 mg/dL (ref 2.3–4.6)
Sodium: 140 mmol/L (ref 135–145)

## 2014-09-12 LAB — CBC
HCT: 29.1 % — ABNORMAL LOW (ref 39.0–52.0)
Hemoglobin: 9.2 g/dL — ABNORMAL LOW (ref 13.0–17.0)
MCH: 28.5 pg (ref 26.0–34.0)
MCHC: 31.6 g/dL (ref 30.0–36.0)
MCV: 90.1 fL (ref 78.0–100.0)
PLATELETS: 140 10*3/uL — AB (ref 150–400)
RBC: 3.23 MIL/uL — AB (ref 4.22–5.81)
RDW: 16.2 % — AB (ref 11.5–15.5)
WBC: 8.1 10*3/uL (ref 4.0–10.5)

## 2014-09-12 LAB — BASIC METABOLIC PANEL
ANION GAP: 10 (ref 5–15)
BUN: 6 mg/dL (ref 6–23)
CO2: 28 mmol/L (ref 19–32)
Calcium: 7.8 mg/dL — ABNORMAL LOW (ref 8.4–10.5)
Chloride: 102 mEq/L (ref 96–112)
Creatinine, Ser: 3.75 mg/dL — ABNORMAL HIGH (ref 0.50–1.35)
GFR calc Af Amer: 19 mL/min — ABNORMAL LOW (ref >90)
GFR, EST NON AFRICAN AMERICAN: 17 mL/min — AB (ref >90)
Glucose, Bld: 90 mg/dL (ref 70–99)
Potassium: 4.2 mmol/L (ref 3.5–5.1)
SODIUM: 140 mmol/L (ref 135–145)

## 2014-09-12 LAB — SURGICAL PCR SCREEN
MRSA, PCR: NEGATIVE
Staphylococcus aureus: NEGATIVE

## 2014-09-12 SURGERY — AMPUTATION DIGIT
Anesthesia: General | Site: Toe | Laterality: Left

## 2014-09-12 MED ORDER — MIDAZOLAM HCL 2 MG/2ML IJ SOLN: 1.0000 mg | INTRAMUSCULAR | Status: DC | PRN

## 2014-09-12 MED ORDER — ONDANSETRON HCL 4 MG/2ML IJ SOLN
INTRAMUSCULAR | Status: DC | PRN
Start: 2014-09-12 — End: 2014-09-12
  Administered 2014-09-12: 4 mg via INTRAVENOUS

## 2014-09-12 MED ORDER — SODIUM CHLORIDE 0.9 % IJ SOLN
INTRAMUSCULAR | Status: AC
  Filled 2014-09-12: qty 10

## 2014-09-12 MED ORDER — 0.9 % SODIUM CHLORIDE (POUR BTL) OPTIME
TOPICAL | Status: DC | PRN
  Administered 2014-09-12: 1000 mL

## 2014-09-12 MED ORDER — PHENYLEPHRINE 40 MCG/ML (10ML) SYRINGE FOR IV PUSH (FOR BLOOD PRESSURE SUPPORT)
PREFILLED_SYRINGE | INTRAVENOUS | Status: AC
  Filled 2014-09-12: qty 10

## 2014-09-12 MED ORDER — MIDAZOLAM HCL 2 MG/2ML IJ SOLN
INTRAMUSCULAR | Status: AC
  Filled 2014-09-12: qty 2

## 2014-09-12 MED ORDER — ROCURONIUM BROMIDE 50 MG/5ML IV SOLN
INTRAVENOUS | Status: AC
  Filled 2014-09-12: qty 1

## 2014-09-12 MED ORDER — LIDOCAINE HCL (CARDIAC) 20 MG/ML IV SOLN
INTRAVENOUS | Status: AC
  Filled 2014-09-12: qty 5

## 2014-09-12 MED ORDER — GLYCOPYRROLATE 0.2 MG/ML IJ SOLN
INTRAMUSCULAR | Status: AC
  Filled 2014-09-12: qty 1

## 2014-09-12 MED ORDER — SODIUM CHLORIDE 0.9 % IV SOLN
INTRAVENOUS | Status: DC | PRN
  Administered 2014-09-12: 15:00:00 via INTRAVENOUS

## 2014-09-12 MED ORDER — FENTANYL CITRATE 0.05 MG/ML IJ SOLN
INTRAMUSCULAR | Status: AC
  Filled 2014-09-12: qty 5

## 2014-09-12 MED ORDER — SUCCINYLCHOLINE CHLORIDE 20 MG/ML IJ SOLN
INTRAMUSCULAR | Status: AC
  Filled 2014-09-12: qty 1

## 2014-09-12 MED ORDER — FENTANYL CITRATE 0.05 MG/ML IJ SOLN: 50.0000 ug | INTRAMUSCULAR | Status: DC | PRN

## 2014-09-12 MED ORDER — EPHEDRINE SULFATE 50 MG/ML IJ SOLN
INTRAMUSCULAR | Status: AC
  Filled 2014-09-12: qty 1

## 2014-09-12 MED ORDER — ONDANSETRON HCL 4 MG/2ML IJ SOLN
INTRAMUSCULAR | Status: AC
  Filled 2014-09-12: qty 2

## 2014-09-12 MED ORDER — HYDROMORPHONE HCL 1 MG/ML IJ SOLN
1.0000 mg | INTRAMUSCULAR | Status: AC | PRN
  Administered 2014-09-13 (×3): 1 mg via INTRAVENOUS
  Filled 2014-09-12 (×2): qty 1

## 2014-09-12 MED ORDER — LIDOCAINE HCL (CARDIAC) 20 MG/ML IV SOLN
INTRAVENOUS | Status: DC | PRN
  Administered 2014-09-12: 40 mg via INTRAVENOUS

## 2014-09-12 MED ORDER — PROPOFOL 10 MG/ML IV BOLUS
INTRAVENOUS | Status: AC
  Filled 2014-09-12: qty 20

## 2014-09-12 MED ORDER — FENTANYL CITRATE 0.05 MG/ML IJ SOLN: 25.0000 ug | INTRAMUSCULAR | Status: DC | PRN

## 2014-09-12 MED ORDER — FENTANYL CITRATE 0.05 MG/ML IJ SOLN
INTRAMUSCULAR | Status: AC
  Filled 2014-09-12: qty 2

## 2014-09-12 MED ORDER — FENTANYL CITRATE 0.05 MG/ML IJ SOLN
INTRAMUSCULAR | Status: DC | PRN
  Administered 2014-09-12: 50 ug via INTRAVENOUS

## 2014-09-12 MED ORDER — HYDROMORPHONE HCL 1 MG/ML IJ SOLN
1.0000 mg | INTRAMUSCULAR | Status: DC | PRN
  Administered 2014-09-12: 1 mg via INTRAVENOUS
  Filled 2014-09-12: qty 1

## 2014-09-12 MED ORDER — PROPOFOL 10 MG/ML IV BOLUS
INTRAVENOUS | Status: DC | PRN
  Administered 2014-09-12: 110 mg via INTRAVENOUS
  Administered 2014-09-12 (×2): 20 mg via INTRAVENOUS

## 2014-09-12 SURGICAL SUPPLY — 41 items
BANDAGE ELASTIC 4 VELCRO ST LF (GAUZE/BANDAGES/DRESSINGS) ×3 IMPLANT
BLADE AVERAGE 25MMX9MM (BLADE)
BLADE AVERAGE 25X9 (BLADE) IMPLANT
BLADE SAW RECIP 87.9 MT (BLADE) IMPLANT
BNDG CONFORM 3 STRL LF (GAUZE/BANDAGES/DRESSINGS) IMPLANT
BNDG GAUZE ELAST 4 BULKY (GAUZE/BANDAGES/DRESSINGS) ×3 IMPLANT
CANISTER SUCTION 2500CC (MISCELLANEOUS) ×3 IMPLANT
CONT SPEC 4OZ CLIKSEAL STRL BL (MISCELLANEOUS) ×2 IMPLANT
COVER SURGICAL LIGHT HANDLE (MISCELLANEOUS) ×3 IMPLANT
DRAPE EXTREMITY T 121X128X90 (DRAPE) ×3 IMPLANT
DRSG ADAPTIC 3X8 NADH LF (GAUZE/BANDAGES/DRESSINGS) ×2 IMPLANT
ELECT REM PT RETURN 9FT ADLT (ELECTROSURGICAL) ×3
ELECTRODE REM PT RTRN 9FT ADLT (ELECTROSURGICAL) ×1 IMPLANT
GAUZE SPONGE 4X4 12PLY STRL (GAUZE/BANDAGES/DRESSINGS) ×3 IMPLANT
GLOVE BIO SURGEON STRL SZ 6.5 (GLOVE) ×1 IMPLANT
GLOVE BIO SURGEONS STRL SZ 6.5 (GLOVE) ×1
GLOVE BIOGEL PI IND STRL 6.5 (GLOVE) IMPLANT
GLOVE BIOGEL PI IND STRL 7.0 (GLOVE) IMPLANT
GLOVE BIOGEL PI IND STRL 7.5 (GLOVE) ×1 IMPLANT
GLOVE BIOGEL PI INDICATOR 6.5 (GLOVE) ×2
GLOVE BIOGEL PI INDICATOR 7.0 (GLOVE) ×2
GLOVE BIOGEL PI INDICATOR 7.5 (GLOVE) ×4
GLOVE SURG SS PI 7.5 STRL IVOR (GLOVE) ×3 IMPLANT
GOWN STRL REUS W/ TWL LRG LVL3 (GOWN DISPOSABLE) ×2 IMPLANT
GOWN STRL REUS W/ TWL XL LVL3 (GOWN DISPOSABLE) ×1 IMPLANT
GOWN STRL REUS W/TWL LRG LVL3 (GOWN DISPOSABLE) ×9
GOWN STRL REUS W/TWL XL LVL3 (GOWN DISPOSABLE) ×3
KIT BASIN OR (CUSTOM PROCEDURE TRAY) ×3 IMPLANT
KIT ROOM TURNOVER OR (KITS) ×3 IMPLANT
NDL HYPO 25GX1X1/2 BEV (NEEDLE) IMPLANT
NEEDLE HYPO 25GX1X1/2 BEV (NEEDLE) IMPLANT
NS IRRIG 1000ML POUR BTL (IV SOLUTION) ×3 IMPLANT
PACK GENERAL/GYN (CUSTOM PROCEDURE TRAY) ×3 IMPLANT
PAD ARMBOARD 7.5X6 YLW CONV (MISCELLANEOUS) ×6 IMPLANT
SPECIMEN JAR SMALL (MISCELLANEOUS) ×3 IMPLANT
SUT ETHILON 3 0 PS 1 (SUTURE) ×3 IMPLANT
SYR BULB IRRIGATION 50ML (SYRINGE) ×2 IMPLANT
SYR CONTROL 10ML LL (SYRINGE) IMPLANT
TUBE ANAEROBIC SPECIMEN COL (MISCELLANEOUS) IMPLANT
UNDERPAD 30X30 INCONTINENT (UNDERPADS AND DIAPERS) ×3 IMPLANT
WATER STERILE IRR 1000ML POUR (IV SOLUTION) ×3 IMPLANT

## 2014-09-12 NOTE — Transfer of Care (Signed)
Immediate Anesthesia Transfer of Care Note  Patient: [name]  Procedure(s) Performed: Procedure(s): AMPUTATION DIGIT-LEFT GREAT TOE (Left)  Patient Location: PACU  Anesthesia Type:General  Level of Consciousness: awake, oriented and patient cooperative  Airway & Oxygen Therapy: Patient Spontanous Breathing and Patient connected to nasal cannula oxygen  Post-op Assessment: Report given to PACU RN and Post -op Vital signs reviewed and stable  Post vital signs: Reviewed  Complications: No apparent anesthesia complications

## 2014-09-12 NOTE — Progress Notes (Signed)
Nerve block not going to be performed per dr. Ermalene Postin.

## 2014-09-12 NOTE — Progress Notes (Signed)
PATIENT DETAILS Name: [name] Age: 53 y.o. Sex: male Date of Birth: 1960/10/05 Admit Date: 09/08/2014 Admitting Physician Berle Mull, MD CWU:[id number],TUUEK L, MD  Brief narrative: 53 year old male patient with known vascular disease, chronic kidney disease on dialysis, hypertension, chronic steroids for gout brought to the emergency room on 08/16/14 with severe right leg pain, evaluated by vascular surgery, and underwent right femoral to below the knee popliteal artery bypass graft on 11/27. Subsequently patient developed significant left leg swelling, with concerns for left venous stenosis related to his left thigh arteriovenous graft, on 12/3 patient underwent left thigh shuntogram with subsequent venoplasty and stenting of the left common femoral vein.Previous left foot swelling improved remarkably after this procedure. On 12/5 patient developed significant hematochezia, unfortunately developed hemorrhagic shock and required ICU transfer. He received a total of 11 units of PRBC, 4 units of FFP and temporarily required pressors to maintain blood pressure. Aspirin/Plavix were placed on hold. He slowly stabilized, hematochezia completely resolved, on 12/11 he started having brown bowel movements. GI planNED to do colonoscopy, however patient refused this bleeding had resolved.  Aspirin and Plavix were subsequently resumed, and patient was discharged home on 09/05/14. Unfortunately on 09/08/14, he had numerous recurrent hematochezia and presented back to the emergency room and was subsequently admitted. Patient has undergone a colonoscopy on 12/21 which is confirmed pandiverticulosis. No further bleeding since admission. Unfortunately , this hospital course has been complicated by left great toe infection plans are for a toe amputation on 12/22.  Subjective  No Major complaints.  Assessment/Plan: Principal Problem:   Lower GI bleed: Suspect this is secondary to diverticular  disease. Prior GI bleeding in his previous admission was attributed it to probably ischemic colitis, he had refused colonoscopy that time. GI consulted, underwent colonoscopy on 12/22 which confirmed diverticulosis. Per GI ok to resume Plavix -will resume on 12/23-as currently NPO for toe amputation. Thankfully no hematochezia overnight.  Active Problems:   Mild acute blood loss anemia: Secondary to above, although hemoglobin was 12.3 on admission, hemoglobin range has been close to his recent baseline from last admission. Monitor CBC, transfuse as needed.   Infected Left Great CMK:LKJZ great toe with purulent foul smelling drainage-suspect likely cultprit for leukocytosis on 12/20. Xray shows possible early gas gangrene, on empiric Vanco/Zosyn.Blood culture on 12/20 negative. VVS following, planning on amputation 12/22 .Leukocytosis has resolved with Abx.      ESRD on hemodialysis: Nephrology consulted for dialysis. Regular schedule is TTS    History of CAD with prior drug-eluting stent April 2015:no acute cardiac issues, given recurrent hematochezia aspirin/Plavix held on admission. Colonoscopy demonstrated Diverticulosis, per GI ok to resume Plavix-since NPO for toe amputation today, will resume on 09/13/14.     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    History of hypertension: Currently controlled, cautiously continue with Coreg and amlodipine.    History of gout: Continue with chronic prednisone and colchicine     Penile lesion:Likely necroses area secondary to known vascular diease and recent hemorrhagic shock. Spoke with Dr Matilde Sprang Urology on call on 12/21 who reviewed chart and picture (taken  12/20), and recommendedaily wound care and to apply bacitracin.  See picture  below.He thought the area will demarcate and heal slowly. Remains stable    Deconditioning/generalized weakness: Secondary to recent prolonged hospitalization/acute illness-PT evaluation.    Severe protein calorie malnutrition: Continue with supplements  Disposition: Remain inpatient-Home with home health services when ready  Antibiotics:  See below   Anti-infectives    Start     Dose/Rate Route Frequency Ordered Stop   09/12/14 1200  vancomycin (VANCOCIN) 500 mg in sodium chloride 0.9 % 100 mL IVPB  Status:  Discontinued     500 mg100 mL/hr over 60 Minutes Intravenous Every T-Th-Sa (Hemodialysis) 09/10/14 1035 09/11/14 1120   09/11/14 1200  vancomycin (VANCOCIN) 500 mg in sodium chloride 0.9 % 100 mL IVPB  Status:  Discontinued     500 mg100 mL/hr over 60 Minutes Intravenous Every Dialysis 09/11/14 1120 09/11/14 1122   09/11/14 1200  vancomycin (VANCOCIN) 500 mg in sodium chloride 0.9 % 100 mL IVPB     500 mg100 mL/hr over 60 Minutes Intravenous Every Mon (Hemodialysis) 09/11/14 1128 09/11/14 1755   09/11/14 1121  vancomycin (VANCOCIN) 500 mg in sodium chloride 0.9 % 100 mL IVPB  Status:  Discontinued     500 mg100 mL/hr over 60 Minutes Intravenous Every Dialysis 09/11/14 1122 09/11/14 1123   09/10/14 1400  clindamycin (CLEOCIN) IVPB 600 mg     600 mg100 mL/hr over 30 Minutes Intravenous 3 times per day 09/10/14 1047 09/11/14 2359   09/10/14 1200  vancomycin (VANCOCIN) IVPB 1000 mg/200 mL premix     1,000 mg200 mL/hr over 60 Minutes Intravenous  Once 09/10/14 1035 09/10/14 1215   09/10/14 1200  piperacillin-tazobactam (ZOSYN) IVPB 2.25 g     2.25 g100 mL/hr over 30 Minutes Intravenous Every 8 hours 09/10/14 1035        DVT Prophylaxis: SCD's  Code Status: Full code   Family Communication None at bedside- patient is awake/alert and understanding of above plan.  Procedures:  None  CONSULTS:   nephrology   MEDICATIONS: Scheduled Meds: . amLODipine  10 mg Oral Daily  . atorvastatin  40 mg Oral q1800  . bacitracin   Topical BID  . calcitRIOL  2.75 mcg Oral Q T,Th,Sa-HD  . calcium acetate  2,001 mg Oral TID WC  . carvedilol  6.25 mg Oral BID WC  . cinacalcet  60 mg Oral Q breakfast  . colchicine  0.3 mg Oral Once per day on Mon Thu  . [START ON 09/14/2014] darbepoetin (ARANESP) injection - DIALYSIS  60 mcg Intravenous Q Thu-HD  . feeding supplement (RESOURCE BREEZE)  1 Container Oral TID BM  . ferric gluconate (FERRLECIT/NULECIT) IV  125 mg Intravenous Q T,Th,Sa-HD  . pantoprazole (PROTONIX) IV  40 mg Intravenous Q12H  . piperacillin-tazobactam (ZOSYN)  IV  2.25 g Intravenous Q8H  . predniSONE  5 mg Oral Daily  . sodium chloride  3 mL Intravenous Q12H   Continuous Infusions:   PRN Meds:.acetaminophen, acetaminophen, metoCLOPramide, ondansetron **OR** ondansetron (ZOFRAN) IV, oxyCODONE    PHYSICAL EXAM: Vital signs in last 24 hours: Filed Vitals:   09/11/14 1859 09/11/14 2032 09/12/14 0427 09/12/14 1014  BP: 128/96 133/45 121/50 117/47  Pulse: 78 56 77 77  Temp: 97.8 F (36.6 C) 98.5 F (36.9 C) 98.7 F (37.1 C) 98.7 F (37.1 C)  TempSrc: Oral Oral Oral Oral  Resp: 18 18 18 18   Height:      Weight:  46.579 kg (102 lb 11 oz)    SpO2: 96% 93% 93% 94%    Weight  change: 0.8 kg (1 lb 12.2 oz) Filed Weights   09/10/14 2100 09/11/14 1801 09/11/14 2032  Weight: 45.8 kg (100 lb 15.5 oz) 46.6 kg (102 lb 11.8 oz) 46.579 kg (102 lb 11 oz)   Body mass index is 13.92 kg/(m^2).   Gen Exam: Awake and alert with clear speech.   Neck: Supple, No JVD.   Chest: B/L Clear.   CVS: S1 S2 Regular, no murmurs.  Abdomen: soft, BS +, non tender, non distended.  Extremities: no edema, lower extremities warm to touch.Foul smelling ulcer in left foot with purulent drainage.Dry gangrene in several toes in right foot Neurologic: Non Focal.   Skin: No Rash.   Wounds: see below      Right Foot with dry gangrene (below)   Left great toe (below)   Intake/Output from previous day:  Intake/Output Summary (Last 24 hours) at 09/12/14 1125 Last data filed at 09/12/14 0433  Gross per 24 hour  Intake    420 ml  Output   1000 ml  Net   -580 ml     LAB RESULTS: CBC  Recent Labs Lab 09/08/14 2121  09/09/14 1649 09/10/14 0433 09/11/14 0919 09/11/14 1445 09/12/14 0520  WBC 10.2  < > 10.5 21.1* 10.9* 8.7 8.1  HGB 12.3*  < > 9.1* 10.4* 9.5* 8.9* 9.2*  HCT 40.0  < > 29.8* 33.6* 30.5* 28.8* 29.1*  PLT 206  < > 202 236 160 170 140*  MCV 91.7  < > 90.0 89.4 90.5 88.3 90.1  MCH 28.2  < > 27.5 27.7 28.2 27.3 28.5  MCHC 30.8  < > 30.5 31.0 31.1 30.9 31.6  RDW 16.6*  < > 16.4* 16.7* 16.2* 16.2* 16.2*  LYMPHSABS 1.5  --   --   --   --   --   --   MONOABS 1.3*  --   --   --   --   --   --   EOSABS 0.3  --   --   --   --   --   --   BASOSABS 0.0  --   --   --   --   --   --   < > = values in this interval not displayed.  Chemistries   Recent Labs Lab 09/09/14 0426 09/09/14 1649 09/11/14 0919 09/11/14 1445 09/12/14 0520  NA 140 141 138 138 140  140  K 4.0 3.9 4.8 4.5 4.4  4.2  CL 98 98 98 99 103  102  CO2 32 29 27 26 25  28   GLUCOSE 60* 65* 92 76 85  90  BUN 25* 31* 18 19 6  6   CREATININE 6.21* 7.03* 5.50* 5.53* 3.74*  3.75*  CALCIUM 8.0* 7.7* 7.8* 7.5* 7.8*  7.8*    CBG:  Recent Labs Lab 09/05/14 1212 09/05/14 1604  GLUCAP 75 82    GFR Estimated Creatinine Clearance: 14.7 mL/min (by C-G formula based on Cr of 3.74).  Coagulation profile  Recent Labs Lab 09/08/14 2121 09/09/14 0426  INR 1.16 1.22    Cardiac Enzymes No results for input(s): CKMB, TROPONINI, MYOGLOBIN in the last 168 hours.  Invalid input(s): CK  Invalid input(s): POCBNP No results for input(s): DDIMER in the last 72 hours. No results for input(s): HGBA1C in the last 72 hours. No results for input(s): CHOL, HDL, LDLCALC, TRIG, CHOLHDL, LDLDIRECT in the  last 72 hours. No results for input(s): TSH, T4TOTAL, T3FREE, THYROIDAB in the last 72 hours.  Invalid input(s): FREET3 No results for input(s): VITAMINB12, FOLATE, FERRITIN, TIBC, IRON, RETICCTPCT in the last 72 hours. No results for input(s): LIPASE, AMYLASE in the last 72 hours.  Urine Studies No results for input(s): UHGB, CRYS in the last 72 hours.  Invalid input(s): UACOL, UAPR, USPG, UPH, UTP, UGL, UKET, UBIL, UNIT, UROB, ULEU, UEPI, UWBC, URBC, UBAC, CAST, UCOM, BILUA  MICROBIOLOGY: Recent Results (from the past 240 hour(s))  Culture, blood (routine x 2)     Status: None (Preliminary result)   Collection Time: 09/10/14 12:20 PM  Result Value Ref Range Status   Specimen Description BLOOD RIGHT WRIST  Final   Special Requests BOTTLES DRAWN AEROBIC ONLY [place]  Final   Culture  Setup Time   Final    09/10/2014 18:32 Performed at [facility]    Culture   Final           BLOOD CULTURE RECEIVED NO GROWTH TO DATE CULTURE WILL BE HELD FOR 5 DAYS BEFORE ISSUING A FINAL NEGATIVE REPORT Performed at [facility]    Report Status PENDING  Incomplete  Culture, blood (routine x 2)     Status: None (Preliminary result)   Collection Time: 09/10/14 12:30 PM  Result Value Ref Range Status   Specimen Description BLOOD LEFT WRIST  Final   Special Requests BOTTLES DRAWN AEROBIC ONLY 10CC  Final   Culture  Setup Time   Final    09/10/2014 18:32 Performed at [facility]    Culture   Final           BLOOD CULTURE RECEIVED NO GROWTH TO DATE CULTURE WILL BE HELD FOR 5 DAYS BEFORE ISSUING A FINAL NEGATIVE REPORT Performed at [facility]    Report Status PENDING  Incomplete    RADIOLOGY STUDIES/RESULTS: Dg Chest 2 View  09/10/2014   CLINICAL DATA:  Leukocytosis  EXAM: CHEST  2 VIEW  COMPARISON:  08/29/2014  FINDINGS: Bilateral mild apical pleural calcifications. Left apical calcifications which are new compared with prior chest x-ray dated 07/01/2012. There  is a small-moderate left pleural effusion. There is no right pleural effusion. There is no pneumothorax. The cardiomediastinal silhouette is stable. No acute osseous abnormality.  IMPRESSION: 1. Small either moderate left pleural effusion. Left apical calcifications again noted which are new since the prior exam of 07/01/2012. These are likely postinflammatory but can be seen in the setting of tuberculosis. Correlate with PPD or QuantiFERON test.   Electronically Signed   By: Kathreen Devoid   On: 09/10/2014 10:35   Ct Abdomen Pelvis W Contrast  08/27/2014   CLINICAL DATA:  Acute onset of bright red blood per rectum, with periumbilical cramping abdominal pain. Initial encounter.  EXAM: CT ABDOMEN AND PELVIS WITH CONTRAST  TECHNIQUE: Multidetector CT imaging of the abdomen and pelvis was performed using the standard protocol following bolus administration of intravenous contrast.  CONTRAST:  165mL OMNIPAQUE IOHEXOL 300 MG/ML  SOLN  COMPARISON:  CT of the abdomen and pelvis from 11/10/2012  FINDINGS: Trace bilateral pleural effusions are seen, with bibasilar atelectasis.  A nonspecific 6 mm hypodensity within the left hepatic lobe is of uncertain significance. The liver is otherwise unremarkable. The spleen is mildly bulky, but remains normal in length. There is persistent chronic gallbladder wall thickening. The gallbladder is difficult to fully assess given trace ascites tracking about the liver.  The pancreas and adrenal glands are unremarkable.  The patient's native kidneys are markedly atrophic, with scattered bilateral renal cysts of  varying size and attenuation. Scattered associated calcifications are seen. There is no evidence of hydronephrosis. No obstructing renal stones are seen.  There is marked fatty infiltration with regard to the transplant kidney at the left iliac fossa, with marked thinning of the renal parenchyma. Diffuse associated vascular calcifications are seen. This appearance is stable from  2014.  There is mild diffuse mesenteric and omental edema, nonspecific in appearance.  The stomach is within normal limits. No acute vascular abnormalities are seen. Diffuse calcification is seen along the abdominal aorta and its branches. Extensive diffuse vascular calcification is noted throughout the abdomen and pelvis.  The patient appears to be status post resection of much of the bowel. Remaining small bowel is grossly unremarkable. A distended anastomosis is noted at the right hemipelvis; this corresponds to the site of the prior right pelvic catheter, which has been removed.  The appendix is not definitely characterized; there is no evidence for appendicitis. Contrast progresses to the level of the proximal sigmoid colon. There is scattered diverticulosis along the transverse colon. Stool is noted filling the distal sigmoid colon and rectum.  There is soft tissue inflammation about the distal sigmoid colon and rectum, raising question for proctitis. Diffuse presacral stranding is also noted, new from the prior study.  The bladder is not well assessed. Soft tissue density about the expected location of the bladder is relatively stable in appearance. 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 This is perhaps slightly more prominent than on the prior study. No inguinal lymphadenopathy is seen.  No acute osseous abnormalities are identified.  IMPRESSION: 1. Soft tissue inflammation about the distal sigmoid colon and rectum, with diffuse presacral stranding, new from the prior study. This raises concern for proctitis, which may explain the patient's bright red blood per rectum. Stool noted filling the distal sigmoid colon and rectum. 2. Scattered diverticulosis along the transverse colon, without evidence of diverticulitis; the colon is otherwise grossly unremarkable. 3. Trace bilateral pleural effusions, with bibasilar  atelectasis. 4. Marked atrophy of the patient's bilateral native kidneys, with bilateral renal cysts and scattered calcifications. Marked fatty infiltration of the transplant kidney at the left iliac fossa, with marked thinning of the renal parenchyma. This appearance is stable from 2014. 5. Stable appearance to soft tissue density about the expected location of the bladder. 6. 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 7. Diffuse calcification along the abdominal aorta and its branches. Extensive diffuse vascular calcification seen throughout the abdomen and pelvis. 8. Nonspecific 6 mm hypodensity within the left hepatic lobe, new from prior studies. 9. Persistent chronic gallbladder wall thickening; gallbladder difficult to fully assess given trace ascites about the liver, but this appearance is relatively stable. 10. Mild nonspecific diffuse mesenteric and omental edema.   Electronically Signed   By: Garald Balding M.D.   On: 08/27/2014 01:30   Dg Chest Port 1 View  08/29/2014   CLINICAL DATA:  Central line placement.  EXAM: PORTABLE CHEST - 1 VIEW  COMPARISON:  07/03/2012  FINDINGS: Interval placement of a left central venous catheter. Tip projects to the left mediastinum over the aortic arch. This could be in the brachiocephalic vein, in a normal variant left superior vena cava, or an arterial branch. No pneumothorax. Calcification in the left apex is likely postinflammatory. This is new since previous study.  Heart size is normal. Hemidiaphragms are not included within the field of view.  IMPRESSION: Nonspecific placement of left central venous catheter to the left of midline, suggesting placement either in the brachiocephalic vein, left superior vena cava, or arterial branch. No pneumothorax.   Electronically Signed   By: Lucienne Capers M.D.   On: 08/29/2014 02:17   Dg Abd  Portable 1v  08/26/2014   CLINICAL DATA:  Acute GI bleed  EXAM: PORTABLE ABDOMEN - 1 VIEW  COMPARISON:  CT abdomen pelvis dated 11/10/2012  FINDINGS: Nonspecific but nonobstructive bowel gas pattern.  Surgical clips overlying the pelvis.  Vascular calcifications.  IMPRESSION: Unremarkable abdominal radiograph.   Electronically Signed   By: Julian Hy M.D.   On: 08/26/2014 22:30   Dg Toe Great Left  09/10/2014   ADDENDUM REPORT: 09/10/2014 10:44  ADDENDUM: These results were called by telephone at the time of interpretation on 09/10/2014 at 10:44 am to Dr. Oren Binet , who verbally acknowledged these results.   Electronically Signed   By: Lowella Grip M.D.   On: 09/10/2014 10:44   09/10/2014   CLINICAL DATA:  Loss of blood flow to first toe ; pain  EXAM: LEFT FIRST TOE  COMPARISON:  None.  FINDINGS: Frontal, oblique, and lateral views were obtained. There is soft tissue swelling along the dorsal distal aspect of the first digit. There are scattered foci of air in the soft tissues, concerning for potential soft tissue infection in this area. There is no erosive change or bony destruction. No fracture or dislocation. Joint spaces appear intact. There is extensive arterial vascular calcification.  IMPRESSION: Soft tissue air is noted in the distal first digit. This finding is concerning for potential infection. Early gas gangrene could present in this manner and must be of concern. No bony destruction. No fracture or dislocation. Extensive arterial vascular calcification. Suspect diabetes mellitus.  Electronically Signed: By: Lowella Grip M.D. On: 09/10/2014 10:39    Oren Binet, MD  [facility] Pager:[phone]  If 7PM-7AM, please contact night-coverage [email or web address] Password [email or web address] 09/12/2014, 11:25 AM   LOS: 4 days

## 2014-09-12 NOTE — Op Note (Signed)
    Patient name: [name] MRN: [id number] DOB: [birth year] Sex: male  09/08/2014 - 09/12/2014 Pre-operative Diagnosis: Left great toe osteo Post-operative diagnosis:  Same Surgeon:  Eldridge Abrahams Assistants:  none Procedure:   Ampuatation of left great toe with metatarsal head Anesthesia:  General Blood Loss:  See anesthesia record Specimens:  Left great toe  Findings:  Adequate bleeding.  Osteo of the toe  Indications:  Infected left toe with open ulcer and fould smelling drainage  Procedure:  The patient was identified in the holding area and taken to [place] [address]  The patient was then placed supine on the table. general anesthesia was administered.  The patient was prepped and draped in the usual sterile fashion.  A time out was called and antibiotics were administered.  Initially, a fishmouth incision was made at the base of the left great toe.  A 10 blade was used to take the tissue down to the bone.  The bone was removed with a bone cutter.  This bone appeared very soft.  I used [employer] to get back to the metatarsal head which was removed.  This was also very soft.  I resected about another 1 cm of the metatarsal bone.  It was at this level that I got back to healthy bone.  There was adequate bleeding.  I did have to remove additional skin so that I could close the wound without a significant cavity.  The wound was then irrigated.  3-0 nylon sutures were placed followed by sterile dressing.  No immediate complications.   Disposition:  To PACU in stable condition.   Theotis Burrow, M.D. [facility] Office: [phone] Pager:  [phone]

## 2014-09-12 NOTE — Interval H&P Note (Signed)
History and Physical Interval Note:  09/12/2014 3:17 PM  [name]  has presented today for surgery, with the diagnosis of Left Great Toe Gangrene  I70.268   The various methods of treatment have been discussed with the patient and family. After consideration of risks, benefits and other options for treatment, the patient has consented to  Procedure(s): AMPUTATION DIGIT-LEFT GREAT TOE (Left) as a surgical intervention .  The patient's history has been reviewed, patient examined, no change in status, stable for surgery.  I have reviewed the patient's chart and labs.  Questions were answered to the patient's satisfaction.     BRABHAM IV, VRock Nephew  The patient continues to have an open ulcer with foul-smelling discharge despite a normalizing white blood cell count.  I discussed with the patient that we should proceed with left great toe amputation.  He understands the need for possible risk for more proximal amputation should this incision not heal.  [name]

## 2014-09-12 NOTE — Progress Notes (Signed)
[facility] Progress Note  Assessment/Plan: 1. Recurrent GIB; had last admission thought to have been secondary to ischemic colitis; again - possibly ischemic - given severe vascular disease - BUT NO noted BP drops during HD; keep systolic BP >235; Colonosocpy 12/21 showed mod diverticulosis thought to have been source of GIB per Dr. Deatra Ina 2. ESRD - TTS - HD Saturday - kept even - HD Monday on Holiday schedule - plan again on Wed.- no heparin 3. Hypertension/volume - BP 120 - 130s -keep > 120 on HD to avoid ischemia; on norvasc 10/carvedilol6.25 bid - parameters written for holding meds for BP < 120; net UF 1 L Monday with post weight of 46.5 4. Anemia - slight decline in Hgb; 9.2 - overall stable ^ Fe (125 x 4) ESA per routine-  5. Metabolic bone disease - controlled with current calcitiriol/ sensipar/binders dose, Corr Ca/P ok 6. Protein calorie malnutrition - Alb 1.8 added Resource supplements to CL, now NPO (RN said possible surgery today) 7. Severe PVD s/o recent bilateral fem pops in Oct and Nov with nonhealing foot wounds 8. Hep C + 9. Chronic steroid use - for gout 10. Leukocytosis - WBC in the 10s - jumped to 21 K Sat pm - tmax 99.3- left foot infection most likely - culture, start empiric Vanc, Cleocin and Zosyn- repeat WBC all much improved  11. Multiple wounds - ulcer tip of penis, right foot toes/distal foot dry gangrene; left great toe gas gangrene-VVS following; pt know let foot surgery may be needed;   [name], PA-C [facility] Beeper [phone] 09/12/2014,9:39 AM  LOS: 4 days   Subjective:   Pain in feet and penis at times  Objective Filed Vitals:   09/11/14 1801 09/11/14 1859 09/11/14 2032 09/12/14 0427  BP: 135/39 128/96 133/45 121/50  Pulse: 72 78 56 77  Temp: 97.4 F (36.3 C) 97.8 F (36.6 C) 98.5 F (36.9 C) 98.7 F (37.1 C)  TempSrc: Oral Oral Oral Oral  Resp: 17 18 18 18   Height:      Weight: 46.6 kg (102 lb  11.8 oz)  46.579 kg (102 lb 11 oz)   SpO2: 96% 96% 93% 93%   Physical Exam General: emaciated, but NAD Heart: RRR Lungs: no rales Abdomen: soft, thin NT Extremities: no sig LE edema; right foot black dry gangrene toes and left great toe ulcer Dialysis Access: left thigh AVGG  Dialysis Orders: TTS @ [facility] 180 3:30 46.5 kg 2K/2Ca 400/800 Profile 2 Heparin none L thigh AVG  Calcitriol 2.75 mcg Aranesp 60 mcg on Thurs Venofer 50 per week Recent labs from 12/17 Hgb 11.3 Fe 22, 27% sat K 3.9 Ca 9.4 P 3 Alb 2.9 iPTH 217 WBC 11.56  Additional Objective Labs: Basic Metabolic Panel:  Recent Labs Lab 09/09/14 1649 09/11/14 0919 09/11/14 1445 09/12/14 0520  NA 141 138 138 140  140  K 3.9 4.8 4.5 4.4  4.2  CL 98 98 99 103  102  CO2 29 27 26 25  28   GLUCOSE 65* 92 76 85  90  BUN 31* 18 19 6  6   CREATININE 7.03* 5.50* 5.53* 3.74*  3.75*  CALCIUM 7.7* 7.8* 7.5* 7.8*  7.8*  PHOS 4.9*  --  4.4 3.3   Liver Function Tests:  Recent Labs Lab 09/08/14 2121 09/09/14 0426 09/09/14 1649 09/11/14 1445 09/12/14 0520  AST 36 25  --   --   --   ALT 15 11  --   --   --  ALKPHOS 166* 134*  --   --   --   BILITOT 1.1 0.8  --   --   --   PROT 6.3 5.4*  --   --   --   ALBUMIN 2.2* 1.8* 1.8* 1.7* 1.7*   CBC:  Recent Labs Lab 09/08/14 2121  09/09/14 1649 09/10/14 0433 09/11/14 0919 09/11/14 1445 09/12/14 0520  WBC 10.2  < > 10.5 21.1* 10.9* 8.7 8.1  NEUTROABS 7.0  --   --   --   --   --   --   HGB 12.3*  < > 9.1* 10.4* 9.5* 8.9* 9.2*  HCT 40.0  < > 29.8* 33.6* 30.5* 28.8* 29.1*  MCV 91.7  < > 90.0 89.4 90.5 88.3 90.1  PLT 206  < > 202 236 160 170 140*  < > = values in this interval not displayed. Blood Culture    Component Value Date/Time   SDES BLOOD LEFT WRIST 09/10/2014 1230   SPECREQUEST BOTTLES DRAWN AEROBIC ONLY 10CC 09/10/2014 1230   CULT  09/10/2014 1230           BLOOD CULTURE RECEIVED NO GROWTH TO DATE CULTURE WILL BE  HELD FOR 5 DAYS BEFORE ISSUING A FINAL NEGATIVE REPORT Performed at [facility] PENDING 09/10/2014 1230   CBG:  Recent Labs Lab 09/05/14 1212 09/05/14 1604  GLUCAP 75 82   Medications:   . amLODipine  10 mg Oral Daily  . atorvastatin  40 mg Oral q1800  . bacitracin   Topical BID  . calcitRIOL  2.75 mcg Oral Q T,Th,Sa-HD  . calcium acetate  2,001 mg Oral TID WC  . carvedilol  6.25 mg Oral BID WC  . cinacalcet  60 mg Oral Q breakfast  . colchicine  0.3 mg Oral Once per day on Mon Thu  . [START ON 09/14/2014] darbepoetin (ARANESP) injection - DIALYSIS  60 mcg Intravenous Q Thu-HD  . feeding supplement (RESOURCE BREEZE)  1 Container Oral TID BM  . ferric gluconate (FERRLECIT/NULECIT) IV  125 mg Intravenous Q T,Th,Sa-HD  . pantoprazole (PROTONIX) IV  40 mg Intravenous Q12H  . piperacillin-tazobactam (ZOSYN)  IV  2.25 g Intravenous Q8H  . predniSONE  5 mg Oral Daily  . sodium chloride  3 mL Intravenous Q12H

## 2014-09-12 NOTE — Progress Notes (Signed)
Physical Therapy Treatment Patient Details Name: [name] MRN: [id number] DOB: 03-04-1961 Today's Date: 09/12/2014    History of Present Illness Patient is a 53 yo male admitted 09/08/14 with recurrent GIB.  Was d/c from hospital on 09/05/14 with similar issue.  PMH:  severe PVD with multiple revascularization procedures and dry gangrene Rt foot, ESRD on HD, renal transplant, HTN, CHF, Hep C, anemia, CAD, PCI 01/13/14.    PT Comments    Pt progressing towards physical therapy goals. Was able to perform peri-care standing EOB without UE support and no LOB. Pt anticipates L big toe amputation this afternoon.   Follow Up Recommendations  Home health PT;Supervision - Intermittent ([place] aide for ADL's)     Equipment Recommendations  None recommended by PT    Recommendations for Other Services       Precautions / Restrictions Precautions Precautions: Fall Required Braces or Orthoses: Other Brace/Splint Other Brace/Splint: DARCO shoe for R foot Restrictions Weight Bearing Restrictions: No    Mobility  Bed Mobility Overal bed mobility: Modified Independent Bed Mobility: Supine to Sit           General bed mobility comments: No physical assist required.  Used bed rail for support. Pt had soiled bed pad and was able to elevate hips over soiled area to scoot to EOB.  Transfers Overall transfer level: Needs assistance Equipment used: None Transfers: Sit to/from [facility] Sit to Stand: Supervision Stand pivot transfers: Min guard       General transfer comment: Pt was able to power-up to full standing with no physical assist. Good dynamic standing balance EOB. Hands-on min guard assist to SPT to recliner chair.   Ambulation/Gait             General Gait Details: Deferred due to fatigue and pain   Stairs            Wheelchair Mobility    Modified Rankin (Stroke Patients Only)       Balance Overall balance assessment: Needs  assistance Sitting-balance support: Feet supported;No upper extremity supported Sitting balance-Leahy Scale: Good     Standing balance support: No upper extremity supported;During functional activity Standing balance-Leahy Scale: Fair Standing balance comment: Pt able to stand EOB and perform peri-care. Able to maintain balance well with dynamic weight shifts to wash.                     Cognition Arousal/Alertness: Awake/alert Behavior During Therapy: WFL for tasks assessed/performed Overall Cognitive Status: Within Functional Limits for tasks assessed                      Exercises      General Comments        Pertinent Vitals/Pain Pain Assessment: Faces Faces Pain Scale: Hurts little more Pain Location: B feet Pain Intervention(s): Limited activity within patient's tolerance;Monitored during session    Home Living                      Prior Function            PT Goals (current goals can now be found in the care plan section) Acute Rehab PT Goals Patient Stated Goal: To walk PT Goal Formulation: With patient/family Time For Goal Achievement: 09/16/14 Potential to Achieve Goals: Good Progress towards PT goals: Progressing toward goals    Frequency  Min 3X/week    PT Plan Current plan remains appropriate  Co-evaluation             End of Session Equipment Utilized During Treatment: Gait belt Activity Tolerance: Patient limited by pain Patient left: in chair;with call bell/phone within reach     Time: 1013-1037 PT Time Calculation (min) (ACUTE ONLY): 24 min  Charges:  $Therapeutic Activity: 23-37 mins                    G Codes:      Rolinda Roan 2014-09-24, 10:47 AM   Rolinda Roan, PT, DPT Acute Rehabilitation Services Pager: [phone]

## 2014-09-12 NOTE — H&P (View-Only) (Signed)
Consult Note  Patient name: [name] MRN: [id number] DOB: Sep 19, 1961 Sex: male  Consulting Physician:  Hospitalist  Reason for Consult:  Chief Complaint  Patient presents with  . GI Bleeding    HISTORY OF PRESENT ILLNESS: The patient is well known to me, having undergone bilateral percutaneous revascularization over the course of the past year for nonhealing wounds.  He has a chronic appearing wound on the left great toe which is the side of his dialysis graft.  He developed thrombosis of his right popliteal artery approximately 1 week following his percutaneous intervention.  He had rest pain.  Therefore on 1127 he underwent a right femoral to below-knee popliteal artery bypass graft with propatent.  The patient has developed dry gangrene of his first 3 toes and the plantar side of his distal foot.  During his hospitalization he developed a GI bleed.  The result when discharged.  He did return yesterday with recurrent GI bleeding.  He was found to have an increase in his white blood cell count to 22,000 and a foul-smelling great toe.  X-rays indicated possible infection.  Patient also has a significant cardiac history.  He has undergone drug-eluting stent placement in the last 6 months and continues on Plavix which has been on hold for a few days given his GI bleed.  Past Medical History  Diagnosis Date  . ESRD on hemodialysis     a. ESRD 2/2 HTN with renal transplant in 1987 (cadaveric) after short period of dialysis;  b. Transplant failed in 2004 and he went back on HD;  c. As of 10/15 getting HD via L thigh AVG on a TTS schedule at [facility].  . Hypertension   . Hx of kidney transplant     a. 1987-> back on HD since 2004  . Gout tophi   . Chronic steroid use     a. Has severe gout. Did not tolerate Allopurinol. Do not taper per PCP   . Systolic CHF, chronic     2 D echo 04/2012 with EF of 45 %   . Malnutrition   . Hx SBO 04/2012    a. 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 F/U with Dr Donne Hazel. Started on TNA  . Anemia associated with chronic renal failure   . Hepatitis C   . GERD (gastroesophageal reflux disease)   . H/O hiatal hernia   . Arthritis   . History of DVT (deep vein thrombosis)   . Peripheral vascular disease     a. 12/2013 PTA of L Pop;  b. 12/2013 PTA R pop, R DP;  c. 06/2014 L Pop CBA/DCB PTA.  . Prostate cancer   . CAD (coronary artery disease)     a. 12/2013 Cath/PCI: EF 45-50%, LM Ca2+, LAD 30-40p, 103m, D1/2/3 min irregs, LCX 50ost, 30-22m, RCA 70p, 95/17m (Rota->3.0x23 Xience distal, 3.0x23 Xience mid, 3.0x28 Xience prox).  . Chronic diastolic CHF (congestive heart failure)     a. 12/2013 Echo: EF 55-60%, mild LVH, nl wall motion, Gr 2 DD.  . Dry gangrene     a. L great toe    Past Surgical History  Procedure Laterality Date  . Laparotomy  04/14/2012    Procedure: EXPLORATORY LAPAROTOMY;  Surgeon: Joyice Faster. Cornett, MD;  Location: [place];  Service: General;  Laterality: N/A;  . Colon resection  04/14/2012  .  Laparotomy  04/22/2012    Procedure: EXPLORATORY LAPAROTOMY;  Surgeon: Adin Hector, MD;  Location: [place];  Service: General;  Laterality: N/A;  lysis of adhesions, omentoplasty, repair small bowel  . Thrombectomy w/ embolectomy  04/27/2012    Procedure: THROMBECTOMY ARTERIOVENOUS GORE-TEX GRAFT;  Surgeon: Angelia Mould, MD;  Location: [place];  Service: Vascular;  Laterality: Left;  Thrombectomy of left thigh arteriovenous gortex graft  . Prostectomy  2011  . Renal grafts    . Pelvic abcess drainage Right 6/14    removal drain s/p bowl resection 13  . Transanal excision of rectal mass N/A 03/30/2013    Procedure: EXCISION OF anal MASS;  Surgeon: Joyice Faster. Cornett, MD;  Location: [place];  Service: General;  Laterality: N/A;  Exam under anesthesia with excision anal verge mass  . Aortogram  08/15/2014    abd aortogram  .  Femoral-popliteal bypass graft Right 08/18/2014    Procedure: BYPASS GRAFT FEMORAL-POPLITEAL ARTERY with Gortex Graft;  Surgeon: Serafina Mitchell, MD;  Location: [place];  Service: Vascular;  Laterality: Right;  . Shuntogram Left 09/29/2012    Procedure: SHUNTOGRAM;  Surgeon: Serafina Mitchell, MD;  Location[facility] C[facility] LAB;  Service: Cardiovascular;  Laterality: Left;  . Abdominal aortagram N/A 01/03/2014    Procedure: ABDOMINAL Maxcine Ham;  Surgeon: Serafina Mitchell, MD;  Location: [facility] CATH LAB;  Service: Cardiovascular;  Laterality: N/A;  . Abdominal aortagram Right 01/11/2014    Procedure: ABDOMINAL AORTAGRAM;  Surgeon: Serafina Mitchell, MD;  Location: [facility] CATH LAB;  Service: Cardiovascular;  Laterality: Right;  . Left heart catheterization with coronary angiogram N/A 01/12/2014    Procedure: LEFT HEART CATHETERIZATION WITH CORONARY ANGIOGRAM;  Surgeon: Leonie Man, MD;  Location: [facility] CATH LAB;  Service: Cardiovascular;  Laterality: N/A;  . Percutaneous coronary rotoblator intervention (pci-r) N/A 01/13/2014    Procedure: PERCUTANEOUS CORONARY ROTOBLATOR INTERVENTION (PCI-R);  Surgeon: Burnell Blanks, MD;  Location: [facility] CATH LAB;  Service: Cardiovascular;  Laterality: N/A;  . Abdominal aortagram N/A 07/19/2014    Procedure: ABDOMINAL AORTAGRAM;  Surgeon: Serafina Mitchell, MD;  Location: [facility] CATH LAB;  Service: Cardiovascular;  Laterality: N/A;  . Shuntogram N/A 08/24/2014    Procedure: Earney Mallet;  Surgeon: Conrad Gatesville, MD;  Location: [facility] CATH LAB;  Service: Cardiovascular;  Laterality: N/A;    History   Social History  . Marital Status: Single    Spouse Name: N/A    Number of Children: N/A  . Years of Education: N/A   Occupational History  . Not on file.   Social History Main Topics  . Smoking status: Former Smoker -- 1.00 packs/day for 10 years    Types: Cigarettes    Quit date: 03/23/1973  . Smokeless tobacco: Never Used     Comment: Quit 30 years ago  . Alcohol Use: No  . Drug Use: No   . Sexual Activity: No   Other Topics Concern  . Not on file   Social History Narrative   Currently in the nursing home until end of the month then will go to live with sister.           Family History  Problem Relation Age of Onset  . Hypertension Father   . Pneumonia Brother   . Lung disease Brother     Allergies as of 09/08/2014 - Review Complete 09/08/2014  Allergen Reaction Noted  . Allopurinol Other (See Comments) 02/27/2010    Current Facility-Administered Medications on File Prior to  Encounter  Medication Dose Route Frequency Provider Last Rate Last Dose  . fentaNYL (SUBLIMAZE) injection   Intravenous PRN Carylon Perches, MD   25 mcg at 05/05/12 1544  . midazolam (VERSED) 5 MG/5ML injection   Intravenous PRN Carylon Perches, MD   2 mg at 05/05/12 1544   Current Outpatient Prescriptions on File Prior to Encounter  Medication Sig Dispense Refill  . amLODipine (NORVASC) 5 MG tablet Take 2 tablets (10 mg total) by mouth daily. 30 tablet 0  . aspirin EC 81 MG tablet Take 81 mg by mouth daily.    Marland Kitchen atorvastatin (LIPITOR) 40 MG tablet TAKE 1 TABLET BY MOUTH DAILY AT 6 PM 30 tablet 0  . calcium acetate (PHOSLO) 667 MG tablet Take 3 tablets by mouth 3 (three) times daily. 270 tablet 0  . carvedilol (COREG) 6.25 MG tablet Take 1 tablet (6.25 mg total) by mouth 2 (two) times daily with a meal. 60 tablet 9  . cinacalcet (SENSIPAR) 60 MG tablet Take 1 tablet (60 mg total) by mouth daily. 30 tablet 0  . clopidogrel (PLAVIX) 75 MG tablet Take 1 tablet (75 mg total) by mouth daily with breakfast. 30 tablet 0  . colchicine (COLCRYS) 0.6 MG tablet Take 0.5 tablets (0.3 mg total) by mouth 2 (two) times a week. On Monday and Thursday 60 tablet 0  . metoCLOPramide (REGLAN) 10 MG tablet Take 1 tablet (10 mg total) by mouth every 8 (eight) hours as needed for nausea or vomiting. 30 tablet 0  . multivitamin (RENA-VIT) TABS tablet Take 1 tablet by mouth daily. 30 tablet 0  . oxyCODONE  (ROXICODONE) 5 MG immediate release tablet Take 1-2 tablets (5-10 mg total) by mouth every 6 (six) hours as needed for severe pain. 60 tablet 0  . pantoprazole (PROTONIX) 40 MG tablet Take 1 tablet (40 mg total) by mouth daily. 30 tablet 0  . polyethylene glycol (MIRALAX / GLYCOLAX) packet Take 17 g by mouth daily. 30 each 0  . predniSONE (DELTASONE) 5 MG tablet Take 1 tablet (5 mg total) by mouth daily. 30 tablet 0  . senna-docusate (SENOKOT-S) 8.6-50 MG per tablet Take 1 tablet by mouth 2 (two) times daily as needed for mild constipation. 30 tablet 0  . Nutritional Supplements (FEEDING SUPPLEMENT, NEPRO CARB STEADY,) LIQD Take 237 mLs by mouth as needed (missed meal during dialysis.). (Patient not taking: Reported on 09/08/2014) 30 Can 0  . simethicone (MYLICON) 40 VO/5.3GU drops Take 0.6 mLs (40 mg total) by mouth 4 (four) times daily as needed for flatulence. (Patient not taking: Reported on 09/08/2014) 30 mL 0     REVIEW OF SYSTEMS: Please see history of present illness, no changes  PHYSICAL EXAMINATION: General: The patient appears their stated age.  Vital signs are BP 137/27 mmHg  Pulse 72  Temp(Src) 97.5 F (36.4 C) (Oral)  Resp 19  Ht 6' (1.829 m)  Wt 99 lb 3.3 oz (45 kg)  BMI 13.45 kg/m2  SpO2 96% Pulmonary: Respirations are non-labored HEENT:  No gross abnormalities Abdomen: Soft and non-tender  Musculoskeletal: There are no major deformities.   Neurologic: No focal weakness or paresthesias are detected, Skin: Dry gangrene to right toes 1 through 3 as well as some areas of early necrosis on the plantar side of his right foot.  The left great toe continues to have foul-smelling drainage with a small open wound on the medial side.  It is very tender to the touch.  No significant erythema is  observed.  The right femoral incision is getting packed.  This is not the source of his infection. Psychiatric: The patient has normal affect. Cardiovascular: There is a regular rate and  rhythm without significant murmur appreciated.  Diagnostic Studies: None   Assessment:  Possible infected left second toe. Plan: On visual inspection, the left great toe does not look significantly different than it did 2 weeks ago, however he now has a white blood cell count as well as air on x-ray suggesting infection.  Of course with his abdominal history he could have a GI source for his white blood cell count, however he has a very benign abdomen.  He is scheduled to get colonoscopy tomorrow.  I discussed with the patient that he is going to need amputation of his left great toe.  I will try to get this coordinated tomorrow by one of my partners, as I am in the office on today.  If this cannot be done I would be available to do a left great toe at the patient on Tuesday.  I did discuss with the patient that if his toe amputation does not have adequate perfusion to heal, he may need a more proximal amputation, as dramatic as a possible above-the-knee amputation.     Eldridge Abrahams, M.D. [facility] Office: [phone] Pager:  [phone]

## 2014-09-12 NOTE — Anesthesia Postprocedure Evaluation (Signed)
  Anesthesia Post-op Note  Patient: [name]  Procedure(s) Performed: Procedure(s): AMPUTATION DIGIT-LEFT GREAT TOE (Left)  Patient Location: PACU  Anesthesia Type:General  Level of Consciousness: awake  Airway and Oxygen Therapy: Patient Spontanous Breathing  Post-op Pain: none  Post-op Assessment: Post-op Vital signs reviewed, Patient's Cardiovascular Status Stable, Respiratory Function Stable, Patent Airway, No signs of Nausea or vomiting and Pain level controlled  Post-op Vital Signs: Reviewed and stable  Last Vitals:  Filed Vitals:   09/12/14 1732  BP: 116/37  Pulse: 68  Temp: 36.4 C  Resp:     Complications: No apparent anesthesia complications

## 2014-09-12 NOTE — Anesthesia Procedure Notes (Signed)
Procedure Name: LMA Insertion Date/Time: 09/12/2014 3:40 PM Performed by: Jenne Campus Pre-anesthesia Checklist: Patient identified, Emergency Drugs available, Suction available, Patient being monitored and Timeout performed Patient Re-evaluated:Patient Re-evaluated prior to inductionOxygen Delivery Method: Circle system utilized Preoxygenation: Pre-oxygenation with 100% oxygen Intubation Type: IV induction Ventilation: Mask ventilation without difficulty LMA: LMA inserted LMA Size: 5.0 Number of attempts: 1 Placement Confirmation: positive ETCO2,  CO2 detector and breath sounds checked- equal and bilateral Tube secured with: Tape Dental Injury: Teeth and Oropharynx as per pre-operative assessment

## 2014-09-12 NOTE — Anesthesia Preprocedure Evaluation (Addendum)
Anesthesia Evaluation  Patient identified by MRN, date of birth, ID band Patient awake    Reviewed: Allergy & Precautions, H&P , NPO status , Patient's Chart, lab work & pertinent test results, reviewed documented beta blocker date and time   History of Anesthesia Complications Negative for: history of anesthetic complications  Airway Mallampati: II  TM Distance: >3 FB Neck ROM: Full    Dental   Pulmonary neg shortness of breath, neg COPDneg recent URI, former smoker,  breath sounds clear to auscultation        Cardiovascular hypertension, Pt. on medications and Pt. on home beta blockers + angina + CAD, + Peripheral Vascular Disease and +CHF Rhythm:Regular     Neuro/Psych negative neurological ROS  negative psych ROS   GI/Hepatic hiatal hernia, GERD-  Medicated and Controlled,(+) Hepatitis -, C  Endo/Other  negative endocrine ROS  Renal/GU ESRF and DialysisRenal diseaseTTS     Musculoskeletal   Abdominal   Peds  Hematology  (+) anemia ,   Anesthesia Other Findings   Reproductive/Obstetrics negative OB ROS                            Anesthesia Physical Anesthesia Plan  ASA: III  Anesthesia Plan: General   Post-op Pain Management:    Induction:   Airway Management Planned: LMA  Additional Equipment: None  Intra-op Plan:   Post-operative Plan: Extubation in OR  Informed Consent: I have reviewed the patients History and Physical, chart, labs and discussed the procedure including the risks, benefits and alternatives for the proposed anesthesia with the patient or authorized representative who has indicated his/her understanding and acceptance.   Dental advisory given  Plan Discussed with: CRNA and Surgeon  Anesthesia Plan Comments:         Anesthesia Quick Evaluation

## 2014-09-12 NOTE — Care Management Note (Signed)
CARE MANAGEMENT NOTE 09/12/2014  Patient:  [name], [name]   Account Number:  [id number]  Date Initiated:  09/12/2014  Documentation initiated by:  Zorian Gunderman  Subjective/Objective Assessment:   CM following for progression and d/c planning.     Action/Plan:   09/05/2014 Met with pt and IM given and explained. Pt states understanding, is anxious to be able to d/c from the hospital.   Anticipated DC Date:  09/14/2014   Anticipated DC Plan:  [place]         Choice offered to / List presented to:             [place].   Status of service:  In process, will continue to follow Medicare Important Message given?  YES (If response is "NO", the following Medicare IM given date fields will be blank) Date Medicare IM given:  09/12/2014 Medicare IM given by:  Tiajuana Leppanen Date Additional Medicare IM given:   Additional Medicare IM given by:    Discharge Disposition:    Per UR Regulation:    If discussed at Long Length of Stay Meetings, dates discussed:    Comments:  09/12/2014 Pt setup with [facility] for [place] services at the time of last d/c, will continue these services.   CRoyal RN MPH, case Freight forwarder.

## 2014-09-13 ENCOUNTER — Encounter (HOSPITAL_COMMUNITY): Payer: Self-pay | Admitting: Surgery

## 2014-09-13 LAB — RENAL FUNCTION PANEL
Albumin: 1.7 g/dL — ABNORMAL LOW (ref 3.5–5.2)
Anion gap: 8 (ref 5–15)
BUN: 10 mg/dL (ref 6–23)
CO2: 28 mmol/L (ref 19–32)
Calcium: 7.3 mg/dL — ABNORMAL LOW (ref 8.4–10.5)
Chloride: 99 mEq/L (ref 96–112)
Creatinine, Ser: 5.82 mg/dL — ABNORMAL HIGH (ref 0.50–1.35)
GFR calc Af Amer: 11 mL/min — ABNORMAL LOW (ref >90)
GFR calc non Af Amer: 10 mL/min — ABNORMAL LOW (ref >90)
Glucose, Bld: 104 mg/dL — ABNORMAL HIGH (ref 70–99)
Phosphorus: 4.1 mg/dL (ref 2.3–4.6)
Potassium: 3.8 mmol/L (ref 3.5–5.1)
Sodium: 135 mmol/L (ref 135–145)

## 2014-09-13 LAB — CBC
HCT: 28.5 % — ABNORMAL LOW (ref 39.0–52.0)
Hemoglobin: 8.9 g/dL — ABNORMAL LOW (ref 13.0–17.0)
MCH: 28.2 pg (ref 26.0–34.0)
MCHC: 31.2 g/dL (ref 30.0–36.0)
MCV: 90.2 fL (ref 78.0–100.0)
Platelets: 131 10*3/uL — ABNORMAL LOW (ref 150–400)
RBC: 3.16 MIL/uL — ABNORMAL LOW (ref 4.22–5.81)
RDW: 16.4 % — ABNORMAL HIGH (ref 11.5–15.5)
WBC: 9.2 10*3/uL (ref 4.0–10.5)

## 2014-09-13 MED ORDER — SODIUM CHLORIDE 0.9 % IV SOLN: 100.0000 mL | INTRAVENOUS | Status: DC | PRN

## 2014-09-13 MED ORDER — LIDOCAINE HCL (PF) 1 % IJ SOLN: 5.0000 mL | INTRAMUSCULAR | Status: DC | PRN

## 2014-09-13 MED ORDER — ALTEPLASE 2 MG IJ SOLR: 2.0000 mg | Freq: Once | INTRAMUSCULAR | Status: AC | PRN

## 2014-09-13 MED ORDER — LIDOCAINE-PRILOCAINE 2.5-2.5 % EX CREA: 1.0000 "application " | TOPICAL_CREAM | CUTANEOUS | Status: DC | PRN

## 2014-09-13 MED ORDER — PENTAFLUOROPROP-TETRAFLUOROETH EX AERO: 1.0000 "application " | INHALATION_SPRAY | CUTANEOUS | Status: DC | PRN

## 2014-09-13 MED ORDER — OXYCODONE HCL 5 MG PO TABS
5.0000 mg | ORAL_TABLET | ORAL | Status: DC | PRN
Start: 2014-09-13 — End: 2014-09-21
  Administered 2014-09-13: 10 mg via ORAL
  Administered 2014-09-13: 5 mg via ORAL
  Administered 2014-09-13 – 2014-09-14 (×2): 10 mg via ORAL
  Administered 2014-09-14: 5 mg via ORAL
  Administered 2014-09-15 – 2014-09-18 (×10): 10 mg via ORAL
  Administered 2014-09-20 (×2): 5 mg via ORAL
  Administered 2014-09-20 – 2014-09-21 (×4): 10 mg via ORAL
  Filled 2014-09-13 (×14): qty 2
  Filled 2014-09-13: qty 1
  Filled 2014-09-13: qty 2
  Filled 2014-09-13 (×3): qty 1
  Filled 2014-09-13 (×2): qty 2

## 2014-09-13 MED ORDER — HEPARIN SODIUM (PORCINE) 1000 UNIT/ML DIALYSIS: 1000.0000 [IU] | INTRAMUSCULAR | Status: DC | PRN

## 2014-09-13 MED ORDER — CLOPIDOGREL BISULFATE 75 MG PO TABS
75.0000 mg | ORAL_TABLET | Freq: Every day | ORAL | Status: DC
  Administered 2014-09-13 – 2014-09-15 (×3): 75 mg via ORAL
  Filled 2014-09-13 (×3): qty 1

## 2014-09-13 MED ORDER — NEPRO/CARBSTEADY PO LIQD: 237.0000 mL | ORAL | Status: DC | PRN

## 2014-09-13 MED ORDER — SODIUM CHLORIDE 0.9 % IV SOLN
500.0000 mg | Freq: Once | INTRAVENOUS | Status: AC
  Administered 2014-09-13: 500 mg via INTRAVENOUS
  Filled 2014-09-13: qty 500

## 2014-09-13 MED ORDER — ALTEPLASE 2 MG IJ SOLR: 2.0000 mg | Freq: Once | INTRAMUSCULAR | Status: DC | PRN

## 2014-09-13 MED ORDER — HEPARIN SODIUM (PORCINE) 1000 UNIT/ML DIALYSIS
1000.0000 [IU] | INTRAMUSCULAR | Status: DC | PRN
  Filled 2014-09-13: qty 1

## 2014-09-13 MED ORDER — HYDROMORPHONE HCL 1 MG/ML IJ SOLN
INTRAMUSCULAR | Status: AC
  Filled 2014-09-13: qty 1

## 2014-09-13 MED ORDER — ALTEPLASE 2 MG IJ SOLR
2.0000 mg | Freq: Once | INTRAMUSCULAR | Status: DC | PRN
Start: 2014-09-13 — End: 2014-09-13

## 2014-09-13 NOTE — Progress Notes (Signed)
Per day shift RN, patient's amputation site with compression dressing had been bleeding profusely, requiring two dressing changes.  At beginning of shift, dressing was saturated with blood again.  Brabham MD of vascular surgery notified of situation; orders received to continue to apply pressure to site.  Dressing was redone.  Will continue to monitor.

## 2014-09-13 NOTE — Progress Notes (Signed)
PT Cancellation Note  Patient Details Name: [name] MRN: [id number] DOB: Feb 02, 1961   Cancelled Treatment:    Reason Eval/Treat Not Completed: Pain limiting ability to participate. Will check back tomorrow. Request WB status as well as need for [place]?   [place], [place] 09/13/2014, 3:10 PM

## 2014-09-13 NOTE — Progress Notes (Signed)
Patient had 5 beat run of ventricular tachycardia earlier in shift.  Asymptomatic.  Notified Tylene Fantasia NP.  Will continue to monitor.

## 2014-09-13 NOTE — Progress Notes (Signed)
NUTRITION FOLLOW UP  Pt meets criteria for SEVERE MALNUTRITION in the context of chronic illness as evidenced by severe fat and muscle mass loss.  DOCUMENTATION CODES Per approved criteria  -Severe Malnutrition in the context of chronic illness -Underweight   INTERVENTION: Continue Resource Breeze po TID, each supplement provides 250 kcal and 9 grams of protein.  Encourage adequate PO intake.  NUTRITION DIAGNOSIS: Increased nutrient needs related to chronic illness, ESRD as evidenced by estimated nutrition needs; ongoing  Goal: Pt to meet >/= 90% of their estimated nutrition needs; progressing  Monitor:  PO intake, weight trends, labs, I/O's  53 y.o. male  Admitting Dx: GI bleed  ASSESSMENT: Pt with Past medical history of ESRD on hemodialysis Tuesday Thursday Saturday, hypertension, renal transplant patient, on chronic prednisone, chronic systolic heart failure, GERD, recent admission for GI bleed. Patient presents with complaints of bleeding in the bowels.  Procedure 12/21: Colonoscopy-Colonoscopy demonstrated pandiverticulosis.  Procedure(12/22) AMPUTATION DIGIT-LEFT GREAT TOE (Left)  Meal completion has ranged from 0-50%. Pt reports having a great appetite currently and PTA at home with eating 3 meals a day. Pt reports he has been drinking his [employer]. Will continue with current orders. Pt with weight loss, 4.5% weight loss in 1 month, however it is not found significant. Pt was encouraged to eat his food at meals and to drink his supplements.  Nutrition Focused Physical Exam:  Subcutaneous Fat:  Orbital Region: N/A Upper Arm Region: Severe depletion Thoracic and Lumbar Region: Severe depletion  Muscle:  Temple Region: N/A Clavicle Bone Region: Severe depletion Clavicle and Acromion Bone Region: Severe depletion Scapular Bone Region:  N/A Dorsal Hand: N/A Patellar Region: Severe depletion Anterior Thigh Region: Severe depletion Posterior Calf Region:  Severe depletion  Edema: non-pitting LE edema  Labs and medication reviewed.  Height: Ht Readings from Last 1 Encounters:  09/08/14 6' (1.829 m)    Weight: Wt Readings from Last 1 Encounters:  09/13/14 105 lb 6.1 oz (47.8 kg)    BMI:  Body mass index is 14.29 kg/(m^2). Underweight  Estimated Nutritional Needs: Kcal: 1800-2000 Protein: 75-95 grams Fluid: 1.2 L/day  Skin: Stage II pressure ulcer on sacrum, incision on right leg, laceration on upper right great toe  Diet Order: Diet renal/carb modified with 1200 ml fluid restriction   Intake/Output Summary (Last 24 hours) at 09/13/14 1010 Last data filed at 09/13/14 0900  Gross per 24 hour  Intake    203 ml  Output      0 ml  Net    203 ml    Last BM: 12/23  Labs:   Recent Labs Lab 09/11/14 1445 09/12/14 0520 09/13/14 0756  NA 138 140  140 135  K 4.5 4.4  4.2 3.8  CL 99 103  102 99  CO2 26 25  28 28   BUN 19 6  6 10   CREATININE 5.53* 3.74*  3.75* 5.82*  CALCIUM 7.5* 7.8*  7.8* 7.3*  PHOS 4.4 3.3 4.1  GLUCOSE 76 85  90 104*    CBG (last 3)  No results for input(s): GLUCAP in the last 72 hours.  Scheduled Meds: . amLODipine  10 mg Oral Daily  . atorvastatin  40 mg Oral q1800  . bacitracin   Topical BID  . calcitRIOL  2.75 mcg Oral Q T,Th,Sa-HD  . calcium acetate  2,001 mg Oral TID WC  . carvedilol  6.25 mg Oral BID WC  . cinacalcet  60 mg Oral Q breakfast  . colchicine  0.3  mg Oral Once per day on Mon Thu  . [START ON 09/14/2014] darbepoetin (ARANESP) injection - DIALYSIS  60 mcg Intravenous Q Thu-HD  . feeding supplement (RESOURCE BREEZE)  1 Container Oral TID BM  . ferric gluconate (FERRLECIT/NULECIT) IV  125 mg Intravenous Q T,Th,Sa-HD  . HYDROmorphone      . pantoprazole (PROTONIX) IV  40 mg Intravenous Q12H  . piperacillin-tazobactam (ZOSYN)  IV  2.25 g Intravenous Q8H  . predniSONE  5 mg Oral Daily  . sodium chloride  3 mL Intravenous Q12H    Continuous Infusions:    Past  Medical History  Diagnosis Date  . ESRD on hemodialysis     a. ESRD 2/2 HTN with renal transplant in 1987 (cadaveric) after short period of dialysis;  b. Transplant failed in 2004 and he went back on HD;  c. As of 10/15 getting HD via L thigh AVG on a TTS schedule at [facility] on [facility].  . Hypertension   . Hx of kidney transplant     a. 1987-> back on HD since 2004  . Gout tophi   . Chronic steroid use     a. Has severe gout. Did not tolerate Allopurinol. Do not taper per PCP   . Systolic CHF, chronic     2 D echo 04/2012 with EF of 45 %   . Malnutrition   . Hx SBO 04/2012    a. 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 F/U with Dr Donne Hazel. Started on TNA  . Anemia associated with chronic renal failure   . Hepatitis C   . GERD (gastroesophageal reflux disease)   . H/O hiatal hernia   . Arthritis   . History of DVT (deep vein thrombosis)   . Peripheral vascular disease     a. 12/2013 PTA of L Pop;  b. 12/2013 PTA R pop, R DP;  c. 06/2014 L Pop CBA/DCB PTA.  . Prostate cancer   . CAD (coronary artery disease)     a. 12/2013 Cath/PCI: EF 45-50%, LM Ca2+, LAD 30-40p, 19m, D1/2/3 min irregs, LCX 50ost, 30-58m, RCA 70p, 95/37m (Rota->3.0x23 Xience distal, 3.0x23 Xience mid, 3.0x28 Xience prox).  . Chronic diastolic CHF (congestive heart failure)     a. 12/2013 Echo: EF 55-60%, mild LVH, nl wall motion, Gr 2 DD.  . Dry gangrene     a. L great toe    Past Surgical History  Procedure Laterality Date  . Laparotomy  04/14/2012    Procedure: EXPLORATORY LAPAROTOMY;  Surgeon: Joyice Faster. Cornett, MD;  Location: [place];  Service: General;  Laterality: N/A;  . Colon resection  04/14/2012  . Laparotomy  04/22/2012    Procedure: EXPLORATORY LAPAROTOMY;  Surgeon: Adin Hector, MD;  Location: [place];  Service: General;  Laterality: N/A;  lysis of adhesions, omentoplasty, repair small bowel  . Thrombectomy w/ embolectomy   04/27/2012    Procedure: THROMBECTOMY ARTERIOVENOUS GORE-TEX GRAFT;  Surgeon: Angelia Mould, MD;  Location: [place];  Service: Vascular;  Laterality: Left;  Thrombectomy of left thigh arteriovenous gortex graft  . Prostectomy  2011  . Renal grafts    . Pelvic abcess drainage Right 6/14    removal drain s/p bowl resection 13  . Transanal excision of rectal mass N/A 03/30/2013    Procedure: EXCISION OF anal MASS;  Surgeon: Joyice Faster. Cornett, MD;  Location: [place];  Service: General;  Laterality: N/A;  Exam under anesthesia with excision anal verge mass  . Aortogram  08/15/2014    abd aortogram  . Femoral-popliteal bypass graft Right 08/18/2014    Procedure: BYPASS GRAFT FEMORAL-POPLITEAL ARTERY with Gortex Graft;  Surgeon: Serafina Mitchell, MD;  Location: [place];  Service: Vascular;  Laterality: Right;  . Shuntogram Left 09/29/2012    Procedure: SHUNTOGRAM;  Surgeon: Serafina Mitchell, MD;  Location: [facility] CATH LAB;  Service: Cardiovascular;  Laterality: Left;  . Abdominal aortagram N/A 01/03/2014    Procedure: ABDOMINAL Maxcine Ham;  Surgeon: Serafina Mitchell, MD;  Location: [facility] CATH LAB;  Service: Cardiovascular;  Laterality: N/A;  . Abdominal aortagram Right 01/11/2014    Procedure: ABDOMINAL AORTAGRAM;  Surgeon: Serafina Mitchell, MD;  Location: [facility] CATH LAB;  Service: Cardiovascular;  Laterality: Right;  . Left heart catheterization with coronary angiogram N/A 01/12/2014    Procedure: LEFT HEART CATHETERIZATION WITH CORONARY ANGIOGRAM;  Surgeon: Leonie Man, MD;  Location: [facility] C[facility] LAB;  Service: Cardiovascular;  Laterality: N/A;  . Percutaneous coronary rotoblator intervention (pci-r) N/A 01/13/2014    Procedure: PERCUTANEOUS CORONARY ROTOBLATOR INTERVENTION (PCI-R);  Surgeon: Burnell Blanks, MD;  Location: [facility] CATH LAB;  Service: Cardiovascular;  Laterality: N/A;  . Abdominal aortagram N/A 07/19/2014    Procedure: ABDOMINAL AORTAGRAM;  Surgeon: Serafina Mitchell, MD;  Location: [facility] CATH LAB;   Service: Cardiovascular;  Laterality: N/A;  . Shuntogram N/A 08/24/2014    Procedure: Earney Mallet;  Surgeon: Conrad Pine Bush, MD;  Location: [facility] CATH LAB;  Service: Cardiovascular;  Laterality: N/A;  . Colonoscopy N/A 09/11/2014    Procedure: COLONOSCOPY;  Surgeon: Inda Castle, MD;  Location: [place];  Service: Endoscopy;  Laterality: N/A;  . Amputation Left 09/12/2014    Procedure: AMPUTATION DIGIT-LEFT GREAT TOE;  Surgeon: Serafina Mitchell, MD;  Location: [place];  Service: Vascular;  Laterality: Left;    Kallie Locks, MS, RD, LDN Pager # [phone] After hours/ weekend pager # [phone]

## 2014-09-13 NOTE — Progress Notes (Signed)
PT Cancellation Note  Patient Details Name: [name] MRN: [id number] DOB: [birth year]   Cancelled Treatment:    Reason Eval/Treat Not Completed: Medical issues which prohibited therapy, pt with new toe amputation yesterday, no new orders yet. Will check back this afternoon.   [place], [place] 09/13/2014, 11:16 AM

## 2014-09-13 NOTE — Progress Notes (Signed)
PATIENT DETAILS Name: [name] Age: 53 y.o. Sex: male Date of Birth: 02-12-1961 Admit Date: 09/08/2014 Admitting Physician Berle Mull, MD EXB:[id number],NUUVO L, MD  Brief narrative: According to last PN 53 year old male patient with known vascular disease, chronic kidney disease on dialysis, hypertension, chronic steroids for gout brought to the emergency room on 08/16/14 with severe right leg pain, evaluated by vascular surgery, and underwent right femoral to below the knee popliteal artery bypass graft on 11/27. Subsequently patient developed significant left leg swelling, with concerns for left venous stenosis related to his left thigh arteriovenous graft, on 12/3 patient underwent left thigh shuntogram with subsequent venoplasty and stenting of the left common femoral vein.Previous left foot swelling improved remarkably after this procedure. On 12/5 patient developed significant hematochezia, unfortunately developed hemorrhagic shock and required ICU transfer. He received a total of 11 units of PRBC, 4 units of FFP and temporarily required pressors to maintain blood pressure. Aspirin/Plavix were placed on hold. He slowly stabilized, hematochezia completely resolved, on 12/11 he started having brown bowel movements. GI planNED to do colonoscopy, however patient refused this bleeding had resolved.  Aspirin and Plavix were subsequently resumed, and patient was discharged home on 09/05/14. Unfortunately on 09/08/14, he had numerous recurrent hematochezia and presented back to the emergency room and was subsequently admitted. Patient has undergone a colonoscopy on 12/21 which is confirmed pandiverticulosis. No further bleeding since admission. Unfortunately , this hospital course has been complicated by left great toe infection plans are for a toe amputation on 12/22.  Subjective  No Major complaints.  Assessment/Plan: Principal Problem:   Lower GI bleed: Suspect this is secondary to  diverticular disease. Prior GI bleeding in his previous admission was attributed it to probably ischemic colitis, he had refused colonoscopy that time. GI consulted, underwent colonoscopy on 12/22 which confirmed diverticulosis. Per GI ok to resume Plavix.  Active Problems:   Mild acute blood loss anemia: Secondary to above, although hemoglobin was 12.3 on admission, hemoglobin has trended down to 8.9  Monitor CBC, transfuse as needed.   Infected Left Great ZDG:UYQI great toe with purulent foul smelling drainage-suspect likely cultprit for leukocytosis on 12/20. Xray shows possible early gas gangrene, on empiric Vanco/Zosyn.Blood culture on 12/20 negative. VVS following, and patient is s/p amputation 12/22 .Leukocytosis has resolved with Abx.      ESRD on hemodialysis: Nephrology consulted for dialysis. Regular schedule is TTS    History of CAD with prior drug-eluting stent April 2015:no acute cardiac issues, given recurrent hematochezia aspirin/Plavix held on admission. 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    History of hypertension: Currently controlled, cautiously continue with Coreg and amlodipine.    History of gout: Continue with chronic prednisone and colchicine     Penile lesion:Likely necroses area secondary to known vascular diease and recent hemorrhagic shock. Associate discussed case with Dr Matilde Sprang Urology on call on 12/21 who reviewed chart and picture (taken 12/20), and recommended daily wound care and to apply bacitracin.  See picture below.He thought the area will demarcate and heal slowly. Remains stable     Deconditioning/generalized weakness: Secondary to recent  prolonged hospitalization/acute illness-PT evaluation.    Severe protein calorie malnutrition: Continue with supplements  Disposition: Remain inpatient-Home with home health services when ready  Antibiotics:  See below   Anti-infectives    Start     Dose/Rate Route Frequency Ordered Stop   09/13/14 1500  vancomycin (VANCOCIN) 500 mg in sodium chloride 0.9 % 100 mL IVPB     500 mg100 mL/hr over 60 Minutes Intravenous  Once 09/13/14 1430     09/12/14 1200  vancomycin (VANCOCIN) 500 mg in sodium chloride 0.9 % 100 mL IVPB  Status:  Discontinued     500 mg100 mL/hr over 60 Minutes Intravenous Every T-Th-Sa (Hemodialysis) 09/10/14 1035 09/11/14 1120   09/11/14 1200  vancomycin (VANCOCIN) 500 mg in sodium chloride 0.9 % 100 mL IVPB  Status:  Discontinued     500 mg100 mL/hr over 60 Minutes Intravenous Every Dialysis 09/11/14 1120 09/11/14 1122   09/11/14 1200  vancomycin (VANCOCIN) 500 mg in sodium chloride 0.9 % 100 mL IVPB     500 mg100 mL/hr over 60 Minutes Intravenous Every Mon (Hemodialysis) 09/11/14 1128 09/11/14 1755   09/11/14 1121  vancomycin (VANCOCIN) 500 mg in sodium chloride 0.9 % 100 mL IVPB  Status:  Discontinued     500 mg100 mL/hr over 60 Minutes Intravenous Every Dialysis 09/11/14 1122 09/11/14 1123   09/10/14 1400  clindamycin (CLEOCIN) IVPB 600 mg     600 mg100 mL/hr over 30 Minutes Intravenous 3 times per day 09/10/14 1047 09/11/14 2359   09/10/14 1200  vancomycin (VANCOCIN) IVPB 1000 mg/200 mL premix     1,000 mg200 mL/hr over 60 Minutes Intravenous  Once 09/10/14 1035 09/10/14 1215   09/10/14 1200  piperacillin-tazobactam (ZOSYN) IVPB 2.25 g     2.25 g100 mL/hr over 30 Minutes Intravenous Every 8 hours 09/10/14 1035        DVT Prophylaxis: SCD's  Code Status: Full code   Family Communication None at bedside- patient is awake/alert and understanding of above plan.  Procedures:  None  CONSULTS:   nephrology   MEDICATIONS: Scheduled Meds: . amLODipine  10 mg Oral Daily  . atorvastatin  40 mg Oral q1800  . bacitracin   Topical BID  . calcitRIOL  2.75 mcg Oral Q T,Th,Sa-HD  . calcium acetate  2,001 mg Oral TID WC  . carvedilol  6.25 mg Oral BID WC  . cinacalcet  60 mg Oral Q breakfast  . colchicine  0.3 mg Oral Once per day on Mon Thu  . [START ON 09/14/2014] darbepoetin (ARANESP) injection - DIALYSIS  60 mcg Intravenous Q Thu-HD  . feeding supplement (RESOURCE BREEZE)  1 Container Oral TID BM  . ferric gluconate (FERRLECIT/NULECIT) IV  125 mg Intravenous Q T,Th,Sa-HD  . pantoprazole (PROTONIX) IV  40 mg Intravenous Q12H  . piperacillin-tazobactam (ZOSYN)  IV  2.25 g Intravenous Q8H  . predniSONE  5 mg Oral Daily  . sodium chloride  3 mL Intravenous Q12H  . vancomycin  500 mg Intravenous Once   Continuous Infusions:   PRN Meds:.sodium chloride, sodium chloride, acetaminophen, acetaminophen, alteplase, feeding supplement (NEPRO CARB STEADY), heparin, lidocaine (PF), lidocaine-prilocaine, metoCLOPramide, ondansetron **OR** ondansetron (ZOFRAN) IV, oxyCODONE, pentafluoroprop-tetrafluoroeth    PHYSICAL EXAM: Vital signs in last 24 hours: Filed Vitals:   09/13/14 1100 09/13/14 1205 09/13/14 1215 09/13/14 1245  BP: 174/66 173/87 186/89 144/52  Pulse: 96 94 93 90  Temp:   98.2 F (36.8 C) 98.4 F (36.9 C)  TempSrc:   Oral Oral  Resp:   28 20  Height:      Weight:   46 kg (101 lb 6.6 oz)   SpO2:   97% 96%    Weight change: -0.197 kg (-7 oz) Filed Weights   09/12/14 2103 09/13/14 0740 09/13/14 1215  Weight: 46.403 kg (102 lb 4.8 oz) 47.8 kg (105 lb 6.1 oz) 46 kg (101 lb 6.6 oz)   Body mass index is 13.75 kg/(m^2).   Gen Exam: Awake and alert with clear speech.   Neck: Supple, No JVD.   Chest: B/L Clear.   CVS: S1 S2 Regular, no murmurs.  Abdomen: soft, BS +, non tender, non distended.  Extremities: no edema, lower extremities warm to touch.Foul smelling ulcer in  left foot with purulent drainage.Dry gangrene in several toes in right foot Neurologic: Non Focal.   Skin: No Rash.   Wounds: see below     Right Foot with dry gangrene (below)   Left great toe (below)   Intake/Output from previous day:  Intake/Output Summary (Last 24 hours) at 09/13/14 1651 Last data filed at 09/13/14 1215  Gross per 24 hour  Intake      3 ml  Output   1770 ml  Net  -1767 ml     LAB RESULTS: CBC  Recent Labs Lab 09/08/14 2121  09/10/14 0433 09/11/14 0919 09/11/14 1445 09/12/14 0520 09/13/14 0755  WBC 10.2  < > 21.1* 10.9* 8.7 8.1 9.2  HGB 12.3*  < > 10.4* 9.5* 8.9* 9.2* 8.9*  HCT 40.0  < > 33.6* 30.5* 28.8* 29.1* 28.5*  PLT 206  < > 236 160 170 140* 131*  MCV 91.7  < > 89.4 90.5 88.3 90.1 90.2  MCH 28.2  < > 27.7 28.2 27.3 28.5 28.2  MCHC 30.8  < > 31.0 31.1 30.9 31.6 31.2  RDW 16.6*  < > 16.7* 16.2* 16.2* 16.2* 16.4*  LYMPHSABS 1.5  --   --   --   --   --   --   MONOABS 1.3*  --   --   --   --   --   --   EOSABS 0.3  --   --   --   --   --   --   BASOSABS 0.0  --   --   --   --   --   --   < > = values in this interval not displayed.  Chemistries   Recent Labs Lab 09/09/14 1649 09/11/14 0919 09/11/14 1445 09/12/14 0520 09/13/14 0756  NA 141 138 138 140  140 135  K 3.9 4.8 4.5 4.4  4.2 3.8  CL 98 98 99 103  102 99  CO2 29 27 26 25  28 28   GLUCOSE 65* 92 76 85  90 104*  BUN 31* 18 19 6  6 10   CREATININE 7.03* 5.50* 5.53* 3.74*  3.75* 5.82*  CALCIUM 7.7* 7.8* 7.5* 7.8*  7.8* 7.3*    CBG: No results for input(s): GLUCAP in the last 168 hours.  GFR Estimated Creatinine Clearance: 9.3 mL/min (by C-G formula based on Cr of 5.82).  Coagulation profile  Recent Labs Lab 09/08/14 2121 09/09/14 0426  INR 1.16 1.22    Cardiac Enzymes No results for input(s): CKMB, TROPONINI, MYOGLOBIN in the last 168 hours.  Invalid input(s): CK  Invalid input(s): POCBNP No results for input(s): DDIMER in the last 72 hours. No  results for input(s): HGBA1C in the last 72 hours. No results  for input(s): CHOL, HDL, LDLCALC, TRIG, CHOLHDL, LDLDIRECT in the last 72 hours. No results for input(s): TSH, T4TOTAL, T3FREE, THYROIDAB in the last 72 hours.  Invalid input(s): FREET3 No results for input(s): VITAMINB12, FOLATE, FERRITIN, TIBC, IRON, RETICCTPCT in the last 72 hours. No results for input(s): LIPASE, AMYLASE in the last 72 hours.  Urine Studies No results for input(s): UHGB, CRYS in the last 72 hours.  Invalid input(s): UACOL, UAPR, USPG, UPH, UTP, UGL, UKET, UBIL, UNIT, UROB, ULEU, UEPI, UWBC, URBC, UBAC, CAST, UCOM, BILUA  MICROBIOLOGY: Recent Results (from the past 240 hour(s))  Culture, blood (routine x 2)     Status: None (Preliminary result)   Collection Time: 09/10/14 12:20 PM  Result Value Ref Range Status   Specimen Description BLOOD RIGHT WRIST  Final   Special Requests BOTTLES DRAWN AEROBIC ONLY [place]  Final   Culture  Setup Time   Final    09/10/2014 18:32 Performed at [facility]    Culture   Final           BLOOD CULTURE RECEIVED NO GROWTH TO DATE CULTURE WILL BE HELD FOR 5 DAYS BEFORE ISSUING A FINAL NEGATIVE REPORT Performed at [facility]    Report Status PENDING  Incomplete  Culture, blood (routine x 2)     Status: None (Preliminary result)   Collection Time: 09/10/14 12:30 PM  Result Value Ref Range Status   Specimen Description BLOOD LEFT WRIST  Final   Special Requests BOTTLES DRAWN AEROBIC ONLY 10CC  Final   Culture  Setup Time   Final    09/10/2014 18:32 Performed at [facility]    Culture   Final           BLOOD CULTURE RECEIVED NO GROWTH TO DATE CULTURE WILL BE HELD FOR 5 DAYS BEFORE ISSUING A FINAL NEGATIVE REPORT Performed at [facility]    Report Status PENDING  Incomplete  Surgical pcr screen     Status: None   Collection Time: 09/12/14  2:36 PM  Result Value Ref Range Status   MRSA, PCR NEGATIVE NEGATIVE Final   Staphylococcus  aureus NEGATIVE NEGATIVE Final    Comment:        The Xpert SA Assay (FDA approved for NASAL specimens in patients over 80 years of age), is one component of a comprehensive surveillance program.  Test performance has been validated by [employer] for patients greater than or equal to 46 year old. It is not intended to diagnose infection nor to guide or monitor treatment.     RADIOLOGY STUDIES/RESULTS: Dg Chest 2 View  09/10/2014   CLINICAL DATA:  Leukocytosis  EXAM: CHEST  2 VIEW  COMPARISON:  08/29/2014  FINDINGS: Bilateral mild apical pleural calcifications. Left apical calcifications which are new compared with prior chest x-ray dated 07/01/2012. There is a small-moderate left pleural effusion. There is no right pleural effusion. There is no pneumothorax. The cardiomediastinal silhouette is stable. No acute osseous abnormality.  IMPRESSION: 1. Small either moderate left pleural effusion. Left apical calcifications again noted which are new since the prior exam of 07/01/2012. These are likely postinflammatory but can be seen in the setting of tuberculosis. Correlate with PPD or QuantiFERON test.   Electronically Signed   By: Kathreen Devoid   On: 09/10/2014 10:35   Ct Abdomen Pelvis W Contrast  08/27/2014   CLINICAL DATA:  Acute onset of bright red blood per rectum, with periumbilical cramping abdominal pain. Initial encounter.  EXAM:  CT ABDOMEN AND PELVIS WITH CONTRAST  TECHNIQUE: Multidetector CT imaging of the abdomen and pelvis was performed using the standard protocol following bolus administration of intravenous contrast.  CONTRAST:  133mL OMNIPAQUE IOHEXOL 300 MG/ML  SOLN  COMPARISON:  CT of the abdomen and pelvis from 11/10/2012  FINDINGS: Trace bilateral pleural effusions are seen, with bibasilar atelectasis.  A nonspecific 6 mm hypodensity within the left hepatic lobe is of uncertain significance. The liver is otherwise unremarkable. The spleen is mildly bulky, but remains normal in  length. There is persistent chronic gallbladder wall thickening. The gallbladder is difficult to fully assess given trace ascites tracking about the liver.  The pancreas and adrenal glands are unremarkable.  The patient's native kidneys are markedly atrophic, with scattered bilateral renal cysts of varying size and attenuation. Scattered associated calcifications are seen. There is no evidence of hydronephrosis. No obstructing renal stones are seen.  There is marked fatty infiltration with regard to the transplant kidney at the left iliac fossa, with marked thinning of the renal parenchyma. Diffuse associated vascular calcifications are seen. This appearance is stable from 2014.  There is mild diffuse mesenteric and omental edema, nonspecific in appearance.  The stomach is within normal limits. No acute vascular abnormalities are seen. Diffuse calcification is seen along the abdominal aorta and its branches. Extensive diffuse vascular calcification is noted throughout the abdomen and pelvis.  The patient appears to be status post resection of much of the bowel. Remaining small bowel is grossly unremarkable. A distended anastomosis is noted at the right hemipelvis; this corresponds to the site of the prior right pelvic catheter, which has been removed.  The appendix is not definitely characterized; there is no evidence for appendicitis. Contrast progresses to the level of the proximal sigmoid colon. There is scattered diverticulosis along the transverse colon. Stool is noted filling the distal sigmoid colon and rectum.  There is soft tissue inflammation about the distal sigmoid colon and rectum, raising question for proctitis. Diffuse presacral stranding is also noted, new from the prior study.  The bladder is not well assessed. Soft tissue density about the expected location of the bladder is relatively stable in appearance. 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 This is perhaps slightly more prominent than on the prior study. No inguinal lymphadenopathy is seen.  No acute osseous abnormalities are identified.  IMPRESSION: 1. Soft tissue inflammation about the distal sigmoid colon and rectum, with diffuse presacral stranding, new from the prior study. This raises concern for proctitis, which may explain the patient's bright red blood per rectum. Stool noted filling the distal sigmoid colon and rectum. 2. Scattered diverticulosis along the transverse colon, without evidence of diverticulitis; the colon is otherwise grossly unremarkable. 3. Trace bilateral pleural effusions, with bibasilar atelectasis. 4. Marked atrophy of the patient's bilateral native kidneys, with bilateral renal cysts and scattered calcifications. Marked fatty infiltration of the transplant kidney at the left iliac fossa, with marked thinning of the renal parenchyma. This appearance is stable from 2014. 5. Stable appearance to soft tissue density about the expected location of the bladder. 6. 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 7. Diffuse calcification along the abdominal aorta and its branches. Extensive diffuse vascular calcification seen throughout the abdomen and pelvis. 8. Nonspecific 6 mm hypodensity within the left hepatic lobe, new  from prior studies. 9. Persistent chronic gallbladder wall thickening; gallbladder difficult to fully assess given trace ascites about the liver, but this appearance is relatively stable. 10. Mild nonspecific diffuse mesenteric and omental edema.   Electronically Signed   By: Garald Balding M.D.   On: 08/27/2014 01:30   Dg Chest Port 1 View  08/29/2014   CLINICAL DATA:  Central line placement.  EXAM: PORTABLE CHEST - 1 VIEW  COMPARISON:  07/03/2012  FINDINGS: Interval placement of a left central  venous catheter. Tip projects to the left mediastinum over the aortic arch. This could be in the brachiocephalic vein, in a normal variant left superior vena cava, or an arterial branch. No pneumothorax. Calcification in the left apex is likely postinflammatory. This is new since previous study. Heart size is normal. Hemidiaphragms are not included within the field of view.  IMPRESSION: Nonspecific placement of left central venous catheter to the left of midline, suggesting placement either in the brachiocephalic vein, left superior vena cava, or arterial branch. No pneumothorax.   Electronically Signed   By: Lucienne Capers M.D.   On: 08/29/2014 02:17   Dg Abd Portable 1v  08/26/2014   CLINICAL DATA:  Acute GI bleed  EXAM: PORTABLE ABDOMEN - 1 VIEW  COMPARISON:  CT abdomen pelvis dated 11/10/2012  FINDINGS: Nonspecific but nonobstructive bowel gas pattern.  Surgical clips overlying the pelvis.  Vascular calcifications.  IMPRESSION: Unremarkable abdominal radiograph.   Electronically Signed   By: Julian Hy M.D.   On: 08/26/2014 22:30   Dg Toe Great Left  09/10/2014   ADDENDUM REPORT: 09/10/2014 10:44  ADDENDUM: These results were called by telephone at the time of interpretation on 09/10/2014 at 10:44 am to Dr. Oren Binet , who verbally acknowledged these results.   Electronically Signed   By: Lowella Grip M.D.   On: 09/10/2014 10:44   09/10/2014   CLINICAL DATA:  Loss of blood flow to first toe ; pain  EXAM: LEFT FIRST TOE  COMPARISON:  None.  FINDINGS: Frontal, oblique, and lateral views were obtained. There is soft tissue swelling along the dorsal distal aspect of the first digit. There are scattered foci of air in the soft tissues, concerning for potential soft tissue infection in this area. There is no erosive change or bony destruction. No fracture or dislocation. Joint spaces appear intact. There is extensive arterial vascular calcification.  IMPRESSION: Soft tissue air is noted in  the distal first digit. This finding is concerning for potential infection. Early gas gangrene could present in this manner and must be of concern. No bony destruction. No fracture or dislocation. Extensive arterial vascular calcification. Suspect diabetes mellitus.  Electronically Signed: By: Lowella Grip M.D. On: 09/10/2014 10:39    Velvet Bathe, MD  [facility] Pager:[phone]  If 7PM-7AM, please contact night-coverage [email or web address] Password [email or web address] 09/13/2014, 4:51 PM   LOS: 5 days

## 2014-09-13 NOTE — Progress Notes (Signed)
  Progress Note    09/13/2014 8:35 AM 1 Day Post-Op  Subjective:  C/o pain at amp site    Filed Vitals:   09/13/14 0750  BP: 200/79  Pulse: 80  Temp:   Resp: 18    Physical Exam: Extremities:  Bandage bloody.  There is minimal bloody drainage from the wound.  The wound is in tact with nylon sutures.     CBC    Component Value Date/Time   WBC 9.2 09/13/2014 0755   RBC 3.16* 09/13/2014 0755   HGB 8.9* 09/13/2014 0755   HCT 28.5* 09/13/2014 0755   PLT 131* 09/13/2014 0755   MCV 90.2 09/13/2014 0755   MCH 28.2 09/13/2014 0755   MCHC 31.2 09/13/2014 0755   RDW 16.4* 09/13/2014 0755   LYMPHSABS 1.5 09/08/2014 2121   MONOABS 1.3* 09/08/2014 2121   EOSABS 0.3 09/08/2014 2121   BASOSABS 0.0 09/08/2014 2121    BMET    Component Value Date/Time   NA 135 09/13/2014 0756   K 3.8 09/13/2014 0756   CL 99 09/13/2014 0756   CO2 28 09/13/2014 0756   GLUCOSE 104* 09/13/2014 0756   BUN 10 09/13/2014 0756   CREATININE 5.82* 09/13/2014 0756   CREATININE 7.80* 01/16/2014 1437   CALCIUM 7.3* 09/13/2014 0756   GFRNONAA 10* 09/13/2014 0756   GFRAA 11* 09/13/2014 0756    INR    Component Value Date/Time   INR 1.22 09/09/2014 0426     Intake/Output Summary (Last 24 hours) at 09/13/14 0835 Last data filed at 09/12/14 2100  Gross per 24 hour  Intake    203 ml  Output      0 ml  Net    203 ml     Assessment:  53 y.o. male is s/p:  Left great toe amputation  1 Day Post-Op  Plan: -bleeding overnight.  Dressing removed and bleeding is now minimal. -wound redressed with slight pressure. -hgb down slightly, but overall stable -increased Oxycodone to every 4 hours for pain control    Leontine Locket, PA-C [facility] [phone] 09/13/2014 8:35 AM    I agree with the above.  Postoperative day 1, status post left great toe amputation.  Patient had bleeding through his dressing overnight here this was reinforced.  We removed the dressing today.   There was still some oozing from the incision.  I placed a pressure dressing over top of this.  The incision looks healthy.  [name]

## 2014-09-13 NOTE — Progress Notes (Signed)
[facility] Progress Note  Assessment/Plan: 1. Recurrent GIB; had last admission thought to have been secondary to ischemic colitis; again - possibly ischemic - given severe vascular disease - BUT NO noted BP drops during HD; keep systolic BP >924; Colonosocpy 12/21 showed mod diverticulosis thought to have been source of GIB per Dr. Deatra Ina 2. ESRD - TTS - HD Saturday - kept even - HD Monday on Holiday schedule - HD today- no heparin K 3.8 on 4 K bath - next HD due Saturday 3. Hypertension/volume - BP much higher today back in usual range -keep > 120 on HD to avoid ischemia; on norvasc 10/carvedilol6.25 bid - parameters written for holding meds for BP < 120; net UF 1 L Monday with post weight of 46.5 - increased goal today  4. Anemia - slight decline in Hgb; 9.2 to 8.9  - overall stable ^ Fe (125 x 4) ESA per routine- did not brisk bleeding when dressing changed on left foot today may contribute to further decline 5. Metabolic bone disease - controlled with current calcitiriol/ sensipar/binders dose, Corr Ca/P ok 6. Protein calorie malnutrition - Alb 1.8 added Resource supplements to CL, now NPO (RN said possible surgery today) 7. Severe PVD s/o recent bilateral fem pops in Oct and Nov with nonhealing foot wounds 8. Hep C + 9. Chronic steroid use - for gout 10. Leukocytosis - WBC in the 10s - jumped to 21 K Sat pm - tmax 99.3- left foot infection most likely - culture, start empiric Vanc, Cleocin and Zosyn- repeat WBC all much improved  11. Multiple wounds - ulcer tip of penis- local care, right foot toes/distal foot dry gangrene; left great toe amputation s/p osteo - Dr Rico Sheehan 12/22 on Vanc and [place], PA-C [facility] Beeper [phone] 09/13/2014,8:34 AM  LOS: 5 days   Subjective:   Some foot pain  Objective Filed Vitals:   09/12/14 2103 09/13/14 0449 09/13/14 0740 09/13/14 0750  BP: 115/24 130/41 206/77 200/79  Pulse: 73 81 83 80   Temp: 97.9 F (36.6 C) 98.9 F (37.2 C) 98.3 F (36.8 C)   TempSrc: Oral Oral    Resp: 14 15 18 18   Height:      Weight: 46.403 kg (102 lb 4.8 oz)  47.8 kg (105 lb 6.1 oz)   SpO2: 97% 99% 96% 98%   Physical Exam General: NAD on HD  Heart: RRR Lungs: no rales Abdomen:  Soft, think nontender Extremities: no edema brisk bleeding from wound during dressing change - pressure dressing reapplied by VVS; right foot gangrenous toes Dialysis Access: left thigh AVGG  Dialysis Orders: TTS @ [facility] 180 3:30 46.5 kg 2K/2Ca 400/800 Profile 2 Heparin none L thigh AVG  Calcitriol 2.75 mcg Aranesp 60 mcg on Thurs Venofer 50 per week Recent labs from 12/17 Hgb 11.3 Fe 22, 27% sat K 3.9 Ca 9.4 P 3 Alb 2.9 iPTH 217 WBC 11.56  Additional Objective Labs: Basic Metabolic Panel:  Recent Labs Lab 09/11/14 1445 09/12/14 0520 09/13/14 0756  NA 138 140  140 135  K 4.5 4.4  4.2 3.8  CL 99 103  102 99  CO2 26 25  28 28   GLUCOSE 76 85  90 104*  BUN 19 6  6 10   CREATININE 5.53* 3.74*  3.75* 5.82*  CALCIUM 7.5* 7.8*  7.8* 7.3*  PHOS 4.4 3.3 4.1   Liver Function Tests:  Recent Labs Lab 09/08/14 2121 09/09/14 0426  09/11/14 1445 09/12/14  1761 09/13/14 0756  AST 36 25  --   --   --   --   ALT 15 11  --   --   --   --   ALKPHOS 166* 134*  --   --   --   --   BILITOT 1.1 0.8  --   --   --   --   PROT 6.3 5.4*  --   --   --   --   ALBUMIN 2.2* 1.8*  < > 1.7* 1.7* 1.7*  < > = values in this interval not displayed. No results for input(s): LIPASE, AMYLASE in the last 168 hours. CBC:  Recent Labs Lab 09/08/14 2121  09/10/14 0433 09/11/14 0919 09/11/14 1445 09/12/14 0520 09/13/14 0755  WBC 10.2  < > 21.1* 10.9* 8.7 8.1 9.2  NEUTROABS 7.0  --   --   --   --   --   --   HGB 12.3*  < > 10.4* 9.5* 8.9* 9.2* 8.9*  HCT 40.0  < > 33.6* 30.5* 28.8* 29.1* 28.5*  MCV 91.7  < > 89.4 90.5 88.3 90.1 90.2  PLT 206  < > 236 160 170 140* 131*  < > =  values in this interval not displayed. Blood Culture    Component Value Date/Time   SDES BLOOD LEFT WRIST 09/10/2014 1230   SPECREQUEST BOTTLES DRAWN AEROBIC ONLY 10CC 09/10/2014 1230   CULT  09/10/2014 1230           BLOOD CULTURE RECEIVED NO GROWTH TO DATE CULTURE WILL BE HELD FOR 5 DAYS BEFORE ISSUING A FINAL NEGATIVE REPORT Performed at [place] PENDING 09/10/2014 1230    Cardiac Enzymes: No results for input(s): CKTOTAL, CKMB, CKMBINDEX, TROPONINI in the last 168 hours. CBG: No results for input(s): GLUCAP in the last 168 hours. Iron Studies: No results for input(s): IRON, TIBC, TRANSFERRIN, FERRITIN in the last 72 hours. @lablastinr3 @ Studies/Results: No results found. Medications:   . amLODipine  10 mg Oral Daily  . atorvastatin  40 mg Oral q1800  . bacitracin   Topical BID  . calcitRIOL  2.75 mcg Oral Q T,Th,Sa-HD  . calcium acetate  2,001 mg Oral TID WC  . carvedilol  6.25 mg Oral BID WC  . cinacalcet  60 mg Oral Q breakfast  . colchicine  0.3 mg Oral Once per day on Mon Thu  . [START ON 09/14/2014] darbepoetin (ARANESP) injection - DIALYSIS  60 mcg Intravenous Q Thu-HD  . feeding supplement (RESOURCE BREEZE)  1 Container Oral TID BM  . ferric gluconate (FERRLECIT/NULECIT) IV  125 mg Intravenous Q T,Th,Sa-HD  . HYDROmorphone      . pantoprazole (PROTONIX) IV  40 mg Intravenous Q12H  . piperacillin-tazobactam (ZOSYN)  IV  2.25 g Intravenous Q8H  . predniSONE  5 mg Oral Daily  . sodium chloride  3 mL Intravenous Q12H

## 2014-09-14 LAB — HEMOGLOBIN AND HEMATOCRIT, BLOOD
HEMATOCRIT: 26.3 % — AB (ref 39.0–52.0)
Hemoglobin: 8.1 g/dL — ABNORMAL LOW (ref 13.0–17.0)

## 2014-09-14 MED ORDER — PANTOPRAZOLE SODIUM 40 MG PO TBEC
40.0000 mg | DELAYED_RELEASE_TABLET | Freq: Two times a day (BID) | ORAL | Status: DC
  Administered 2014-09-14 – 2014-09-21 (×14): 40 mg via ORAL
  Filled 2014-09-14 (×10): qty 1

## 2014-09-14 MED ORDER — DOXYCYCLINE HYCLATE 100 MG PO TABS
100.0000 mg | ORAL_TABLET | Freq: Two times a day (BID) | ORAL | Status: DC
  Administered 2014-09-14 – 2014-09-21 (×15): 100 mg via ORAL
  Filled 2014-09-14 (×17): qty 1

## 2014-09-14 MED ORDER — CEPHALEXIN 500 MG PO CAPS
500.0000 mg | ORAL_CAPSULE | Freq: Every day | ORAL | Status: DC
  Administered 2014-09-14 – 2014-09-20 (×7): 500 mg via ORAL
  Filled 2014-09-14 (×7): qty 1

## 2014-09-14 MED ORDER — KETOROLAC TROMETHAMINE 30 MG/ML IJ SOLN
30.0000 mg | Freq: Four times a day (QID) | INTRAMUSCULAR | Status: DC
  Administered 2014-09-14 (×2): 30 mg via INTRAVENOUS
  Filled 2014-09-14 (×6): qty 1

## 2014-09-14 NOTE — Progress Notes (Signed)
[facility] Progress Note  Assessment/Plan: 1. Recurrent GIB; had last admission thought to have been secondary to ischemic colitis; again - possibly ischemic - given severe vascular disease - BUT NO noted BP drops during HD; keep systolic BP >664; Colonosocpy 12/21 showed mod diverticulosis thought to have been source of GIB per Dr. Deatra Ina 2. ESRD - TTS - HD Saturday - kept even - HD Monday on Holiday schedule - HD today- no heparin K 3.8 on 4 K bath - next HD due Saturday (orders written) 3. Hypertension/volume - BP ok -keep > 120 on HD to avoid ischemia; on norvasc 10/carvedilol6.25 bid - parameters written for holding meds for BP < 120; net UF 1 L Monday with post weight of 46.5 - net UF Wed 1.7 with post weight of  46  4. Anemia - slight decline in Hgb; 9.2 to 8.9 - overall stable ^ Fe (125 x 4) ESA per routine- did not brisk bleeding when dressing changed on left foot Wed may contribute to further decline - recheck pre HD Saturday - 5. Metabolic bone disease - controlled with current calcitiriol/ sensipar/binders dose, Corr Ca/P ok 6. Protein calorie malnutrition - Alb 1.8 added Resource supplements to CL, now NPO (RN said possible surgery today) 7. Severe PVD s/o recent bilateral fem pops in Oct and Nov with nonhealing foot wounds 8. Hep C + 9. Chronic steroid use - for gout 10. Leukocytosis - WBC in the 10s - jumped to 21 K Sat pm - tmax 99.3- left foot infection most likely - culture, start empiric Vanc, and Zosyn- repeat WBC all much improved ? If any antibiotics needed for d/c  11. Multiple wounds - ulcer tip of penis- local care, right foot toes/distal foot dry gangrene; left great toe amputation s/p osteo - Dr Rico Sheehan 12/22 on Vanc and [place], PA-C [facility] Beeper [phone] 09/14/2014,8:12 AM  LOS: 6 days   Subjective:   Complaining that pain med is not strong enough; left foot hurts. At about 25% of  breakfast Objective Filed Vitals:   09/13/14 1245 09/13/14 1719 09/13/14 2100 09/14/14 0500  BP: 144/52 130/48 112/36 132/39  Pulse: 90 91 81 85  Temp: 98.4 F (36.9 C) 97.5 F (36.4 C) 98.3 F (36.8 C) 98.6 F (37 C)  TempSrc: Oral Oral Oral Oral  Resp: 20 20 18 18   Height:      Weight:   46 kg (101 lb 6.6 oz)   SpO2: 96% 98% 98% 94%   Physical Exam General: uncomfortable, emaciated Heart: RRR Lungs: no rales Abdomen: soft NT Extremities: left foot wrapped; right foot/toe gangrene stable - no overt edema Dialysis Access: left thigh AVGG + bruit  Dialysis Orders: TTS @ [facility] 180 3:30 46.5 kg 2K/2Ca 400/800 Profile 2 Heparin none L thigh AVG  Calcitriol 2.75 mcg Aranesp 60 mcg on Thurs Venofer 50 per week Recent labs from 12/17 Hgb 11.3 Fe 22, 27% sat K 3.9 Ca 9.4 P 3 Alb 2.9 iPTH 217 WBC 11.56   Additional Objective Labs: Basic Metabolic Panel:  Recent Labs Lab 09/11/14 1445 09/12/14 0520 09/13/14 0756  NA 138 140  140 135  K 4.5 4.4  4.2 3.8  CL 99 103  102 99  CO2 26 25  28 28   GLUCOSE 76 85  90 104*  BUN 19 6  6 10   CREATININE 5.53* 3.74*  3.75* 5.82*  CALCIUM 7.5* 7.8*  7.8* 7.3*  PHOS 4.4 3.3 4.1  Liver Function Tests:  Recent Labs Lab 09/08/14 2121 09/09/14 0426  09/11/14 1445 09/12/14 0520 09/13/14 0756  AST 36 25  --   --   --   --   ALT 15 11  --   --   --   --   ALKPHOS 166* 134*  --   --   --   --   BILITOT 1.1 0.8  --   --   --   --   PROT 6.3 5.4*  --   --   --   --   ALBUMIN 2.2* 1.8*  < > 1.7* 1.7* 1.7*  < > = values in this interval not displayed. No results for input(s): LIPASE, AMYLASE in the last 168 hours. CBC:  Recent Labs Lab 09/08/14 2121  09/10/14 0433 09/11/14 0919 09/11/14 1445 09/12/14 0520 09/13/14 0755  WBC 10.2  < > 21.1* 10.9* 8.7 8.1 9.2  NEUTROABS 7.0  --   --   --   --   --   --   HGB 12.3*  < > 10.4* 9.5* 8.9* 9.2* 8.9*  HCT 40.0  < > 33.6* 30.5* 28.8*  29.1* 28.5*  MCV 91.7  < > 89.4 90.5 88.3 90.1 90.2  PLT 206  < > 236 160 170 140* 131*  < > = values in this interval not displayed. Blood Culture    Component Value Date/Time   SDES BLOOD LEFT WRIST 09/10/2014 1230   SPECREQUEST BOTTLES DRAWN AEROBIC ONLY 10CC 09/10/2014 1230   CULT  09/10/2014 1230           BLOOD CULTURE RECEIVED NO GROWTH TO DATE CULTURE WILL BE HELD FOR 5 DAYS BEFORE ISSUING A FINAL NEGATIVE REPORT Performed at [place] PENDING 09/10/2014 1230    Cardiac Enzymes: No results for input(s): CKTOTAL, CKMB, CKMBINDEX, TROPONINI in the last 168 hours. CBG: No results for input(s): GLUCAP in the last 168 hours. Iron Studies: No results for input(s): IRON, TIBC, TRANSFERRIN, FERRITIN in the last 72 hours. @lablastinr3 @ Studies/Results: No results found. Medications:   . amLODipine  10 mg Oral Daily  . atorvastatin  40 mg Oral q1800  . bacitracin   Topical BID  . calcitRIOL  2.75 mcg Oral Q T,Th,Sa-HD  . calcium acetate  2,001 mg Oral TID WC  . carvedilol  6.25 mg Oral BID WC  . cinacalcet  60 mg Oral Q breakfast  . clopidogrel  75 mg Oral Daily  . colchicine  0.3 mg Oral Once per day on Mon Thu  . darbepoetin (ARANESP) injection - DIALYSIS  60 mcg Intravenous Q Thu-HD  . feeding supplement (RESOURCE BREEZE)  1 Container Oral TID BM  . ferric gluconate (FERRLECIT/NULECIT) IV  125 mg Intravenous Q T,Th,Sa-HD  . pantoprazole (PROTONIX) IV  40 mg Intravenous Q12H  . piperacillin-tazobactam (ZOSYN)  IV  2.25 g Intravenous Q8H  . predniSONE  5 mg Oral Daily  . sodium chloride  3 mL Intravenous Q12H

## 2014-09-14 NOTE — Evaluation (Addendum)
Physical Therapy Evaluation Patient Details Name: [name] MRN: [id number] DOB: [birth year] Today's Date: 09/14/2014   History of Present Illness  Patient is a 53 yo male admitted 09/08/14 with recurrent GIB.  Was d/c from hospital on 09/05/14 with similar issue.  PMH:  severe PVD with multiple revascularization procedures and dry gangrene Rt foot, ESRD on HD, renal transplant, HTN, CHF, Hep C, anemia, CAD, PCI 01/13/14.Marland Kitchen  Underwent amputation of Left great toe on 09/12/14.  Clinical Impression  Patient did well with bed mobility and scooting in bed.  Unable to stand or ambulate due to pain in left foot.  Feel as pain is controlled, patient will progress well.  Patient will benefit from PT to progress mobility and increase independence for return home.  Encouraged patient to sit EOB during meals and to get in chair with nursing as able.      Follow Up Recommendations Home health PT;Supervision - Intermittent    Equipment Recommendations  None recommended by PT    Recommendations for Other Services       Precautions / Restrictions Precautions Precautions: Fall Required Braces or Orthoses: Other Brace/Splint Other Brace/Splint: Darco for left foot; Darco for right foot. Restrictions Other Position/Activity Restrictions: Weight bearing through Left heel      Mobility  Bed Mobility Overal bed mobility: Modified Independent Bed Mobility: Supine to Sit;Sit to Supine     Supine to sit: Modified independent (Device/Increase time) Sit to supine: Modified independent (Device/Increase time)   General bed mobility comments: used railing during supine to sit.  Able to scoot self up in bed using railings.  Transfers Overall transfer level:  (unable to weight bear through left foot to stand)                  Ambulation/Gait                Stairs            Wheelchair Mobility    Modified Rankin (Stroke Patients Only)       Balance   Sitting-balance  support: No upper extremity supported;Feet supported Sitting balance-Leahy Scale: Good                                       Pertinent Vitals/Pain Pain Assessment: 0-10 Pain Score: 10-Worst pain ever Pain Location: left foot Pain Descriptors / Indicators: Burning;Tightness;Operative site guarding Pain Intervention(s): Limited activity within patient's tolerance;Relaxation    Home Living Family/patient expects to be discharged to:: Private residence Living Arrangements: Other relatives (Sister and nephew) Available Help at Discharge: Family;Available PRN/intermittently (Sister and nephew work) Type of Home: House Home Access: Stairs to enter Entrance Stairs-Rails: Can reach both Entrance Stairs-Number of Steps: 2 Home Layout: One level Home Equipment: Environmental consultant - 2 wheels;Cane - single point;Bedside commode;Shower seat      Prior Function Level of Independence: Independent with assistive device(s)         Comments: Use of RW for gait     Hand Dominance   Dominant Hand: Right    Extremity/Trunk Assessment   Upper Extremity Assessment: Generalized weakness           Lower Extremity Assessment: Generalized weakness (did not test ankle/foot due to pain)         Communication   Communication: No difficulties  Cognition Arousal/Alertness: Awake/alert Behavior During Therapy: WFL for tasks assessed/performed Overall Cognitive Status: Within Functional  Limits for tasks assessed                      General Comments      Exercises General Exercises - Lower Extremity Short Arc Quad: AROM;Both;10 reps;Supine Heel Slides: AROM;Both;10 reps;Supine      Assessment/Plan    PT Assessment Patient needs continued PT services  PT Diagnosis Difficulty walking;Acute pain   PT Problem List Decreased strength;Decreased activity tolerance;Decreased mobility;Decreased knowledge of use of DME;Pain  PT Treatment Interventions DME instruction;Gait  training;Stair training;Functional mobility training;Therapeutic activities;Therapeutic exercise;Patient/family education   PT Goals (Current goals can be found in the Care Plan section) Acute Rehab PT Goals Patient Stated Goal: To walk with no pain PT Goal Formulation:  (goals from 09-09-14 remain appropriate.) Time For Goal Achievement: 09/28/14 Potential to Achieve Goals: Good    Frequency Min 3X/week   Barriers to discharge Inaccessible home environment;Decreased caregiver support alone during day; difficult negotiating stairs pta per chart.    Co-evaluation               End of Session   Activity Tolerance: Patient limited by pain Patient left: in bed;with call bell/phone within reach;with bed alarm set           Time: 0950-1008 PT Time Calculation (min) (ACUTE ONLY): 18 min   Charges:   PT Evaluation $Initial PT Evaluation Tier I: 1 Procedure     PT G CodesShanna Cisco 09/14/2014, 10:16 AM  09/14/2014 [place], [place]

## 2014-09-14 NOTE — Progress Notes (Signed)
Pt had bloody stools X2. VS stable. MD notified through text page no response. Will continue to monitor.

## 2014-09-14 NOTE — Progress Notes (Signed)
    Subjective  - POD #2  S/p left great toe amp C/o pain   Physical Exam:  Left toe amp site ok Dry gangreene to right foot stable        Assessment/Plan:    Add toradol for pain control   BRABHAM IV, V. WELLS 09/14/2014 9:44 AM --  Filed Vitals:   09/14/14 0500  BP: 132/39  Pulse: 85  Temp: 98.6 F (37 C)  Resp: 18    Intake/Output Summary (Last 24 hours) at 09/14/14 0944 Last data filed at 09/13/14 1700  Gross per 24 hour  Intake    120 ml  Output   1770 ml  Net  -1650 ml     Laboratory CBC    Component Value Date/Time   WBC 9.2 09/13/2014 0755   HGB 8.9* 09/13/2014 0755   HCT 28.5* 09/13/2014 0755   PLT 131* 09/13/2014 0755    BMET    Component Value Date/Time   NA 135 09/13/2014 0756   K 3.8 09/13/2014 0756   CL 99 09/13/2014 0756   CO2 28 09/13/2014 0756   GLUCOSE 104* 09/13/2014 0756   BUN 10 09/13/2014 0756   CREATININE 5.82* 09/13/2014 0756   CREATININE 7.80* 01/16/2014 1437   CALCIUM 7.3* 09/13/2014 0756   GFRNONAA 10* 09/13/2014 0756   GFRAA 11* 09/13/2014 0756    COAG Lab Results  Component Value Date   INR 1.22 09/09/2014   INR 1.16 09/08/2014   INR 1.19 08/29/2014   No results found for: PTT  Antibiotics Anti-infectives    Start     Dose/Rate Route Frequency Ordered Stop   09/13/14 1500  vancomycin (VANCOCIN) 500 mg in sodium chloride 0.9 % 100 mL IVPB     500 mg100 mL/hr over 60 Minutes Intravenous  Once 09/13/14 1430 09/13/14 1720   09/12/14 1200  vancomycin (VANCOCIN) 500 mg in sodium chloride 0.9 % 100 mL IVPB  Status:  Discontinued     500 mg100 mL/hr over 60 Minutes Intravenous Every T-Th-Sa (Hemodialysis) 09/10/14 1035 09/11/14 1120   09/11/14 1200  vancomycin (VANCOCIN) 500 mg in sodium chloride 0.9 % 100 mL IVPB  Status:  Discontinued     500 mg100 mL/hr over 60 Minutes Intravenous Every Dialysis 09/11/14 1120 09/11/14 1122   09/11/14 1200  vancomycin (VANCOCIN) 500 mg in sodium chloride 0.9 % 100 mL  IVPB     500 mg100 mL/hr over 60 Minutes Intravenous Every Mon (Hemodialysis) 09/11/14 1128 09/11/14 1755   09/11/14 1121  vancomycin (VANCOCIN) 500 mg in sodium chloride 0.9 % 100 mL IVPB  Status:  Discontinued     500 mg100 mL/hr over 60 Minutes Intravenous Every Dialysis 09/11/14 1122 09/11/14 1123   09/10/14 1400  clindamycin (CLEOCIN) IVPB 600 mg     600 mg100 mL/hr over 30 Minutes Intravenous 3 times per day 09/10/14 1047 09/11/14 2359   09/10/14 1200  vancomycin (VANCOCIN) IVPB 1000 mg/200 mL premix     1,000 mg200 mL/hr over 60 Minutes Intravenous  Once 09/10/14 1035 09/10/14 1215   09/10/14 1200  piperacillin-tazobactam (ZOSYN) IVPB 2.25 g     2.25 g100 mL/hr over 30 Minutes Intravenous Every 8 hours 09/10/14 1035         V. Leia Alf, M.D. [facility] Office: [phone] Pager:  [phone]

## 2014-09-14 NOTE — Progress Notes (Signed)
Pt has passed 2 large bright red bloody bowel movements with several black blood clots. Blood pressure 101/19, pulse 77, temperature 98 F (36.7 C), SpO2 100 %. Brooke Therapist, sports from rapid response made aware. Alene Mires MD made aware. New orders placed for hemoglobin and hematocrit lab draws. Pt asymptomatic at this time. Will continue to monitor. Velora Mediate

## 2014-09-14 NOTE — Plan of Care (Signed)
Problem: Phase II Progression Outcomes Goal: Tolerating diet Outcome: Not Applicable Date Met:  17/61/60 Did not eat dinner last night. No appetite.

## 2014-09-14 NOTE — Progress Notes (Signed)
ANTIBIOTIC CONSULT NOTE - INITIAL  Pharmacy Consult for vancomycin, zosyn Indication: Great toe infection  Allergies  Allergen Reactions  . Allopurinol Other (See Comments)    REACTION: decreased platelets    Patient Measurements: Height: 6' (182.9 cm) Weight: 101 lb 6.6 oz (46 kg) IBW/kg (Calculated) : 77.6 Adjusted Body Weight:  Vital Signs: Temp: 98.4 F (36.9 C) (12/24 1000) Temp Source: Oral (12/24 1000) BP: 98/70 mmHg (12/24 1000) Pulse Rate: 87 (12/24 1000) Intake/Output from previous day: 12/23 0701 - 12/24 0700 In: 120 [P.O.:120] Out: 1770  Intake/Output from this shift: Total I/O In: 120 [P.O.:120] Out: -   Labs:  Recent Labs  09/11/14 1445 09/12/14 0520 09/13/14 0755 09/13/14 0756  WBC 8.7 8.1 9.2  --   HGB 8.9* 9.2* 8.9*  --   PLT 170 140* 131*  --   CREATININE 5.53* 3.74*  3.75*  --  5.82*   Medical History: Past Medical History  Diagnosis Date  . ESRD on hemodialysis     a. ESRD 2/2 HTN with renal transplant in 1987 (cadaveric) after short period of dialysis;  b. Transplant failed in 2004 and he went back on HD;  c. As of 10/15 getting HD via L thigh AVG on a TTS schedule at [facility] on [facility].  . Hypertension   . Hx of kidney transplant     a. 1987-> back on HD since 2004  . Gout tophi   . Chronic steroid use     a. Has severe gout. Did not tolerate Allopurinol. Do not taper per PCP   . Systolic CHF, chronic     2 D echo 04/2012 with EF of 45 %   . Malnutrition   . Hx SBO 04/2012    a. 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 F/U with Dr Donne Hazel. Started on TNA  . Anemia associated with chronic renal failure   . Hepatitis C   . GERD (gastroesophageal reflux disease)   . H/O hiatal hernia   . Arthritis   . History of DVT (deep vein thrombosis)   . Peripheral vascular disease     a. 12/2013 PTA of L Pop;  b. 12/2013 PTA R pop, R DP;  c. 06/2014 L Pop CBA/DCB  PTA.  . Prostate cancer   . CAD (coronary artery disease)     a. 12/2013 Cath/PCI: EF 45-50%, LM Ca2+, LAD 30-40p, 58m, D1/2/3 min irregs, LCX 50ost, 30-17m, RCA 70p, 95/59m (Rota->3.0x23 Xience distal, 3.0x23 Xience mid, 3.0x28 Xience prox).  . Chronic diastolic CHF (congestive heart failure)     a. 12/2013 Echo: EF 55-60%, mild LVH, nl wall motion, Gr 2 DD.  . Dry gangrene     a. L great toe    Medications:  Prescriptions prior to admission  Medication Sig Dispense Refill Last Dose  . amLODipine (NORVASC) 5 MG tablet Take 2 tablets (10 mg total) by mouth daily. 30 tablet 0 09/07/2014 at Unknown time  . aspirin EC 81 MG tablet Take 81 mg by mouth daily.   09/07/2014 at Unknown time  . atorvastatin (LIPITOR) 40 MG tablet TAKE 1 TABLET BY MOUTH DAILY AT 6 PM 30 tablet 0 09/07/2014 at Unknown time  . calcium acetate (PHOSLO) 667 MG tablet Take 3 tablets by mouth 3 (three) times daily. 270 tablet 0 09/07/2014 at Unknown time  . carvedilol (COREG) 6.25 MG tablet Take 1 tablet (6.25 mg total) by mouth  2 (two) times daily with a meal. 60 tablet 9 09/07/2014 at 0800  . cinacalcet (SENSIPAR) 60 MG tablet Take 1 tablet (60 mg total) by mouth daily. 30 tablet 0 09/07/2014 at Unknown time  . clopidogrel (PLAVIX) 75 MG tablet Take 1 tablet (75 mg total) by mouth daily with breakfast. 30 tablet 0 09/07/2014 at Unknown time  . colchicine (COLCRYS) 0.6 MG tablet Take 0.5 tablets (0.3 mg total) by mouth 2 (two) times a week. On Monday and Thursday 60 tablet 0 09/07/2014 at Unknown time  . metoCLOPramide (REGLAN) 10 MG tablet Take 1 tablet (10 mg total) by mouth every 8 (eight) hours as needed for nausea or vomiting. 30 tablet 0 Past Week at Unknown time  . multivitamin (RENA-VIT) TABS tablet Take 1 tablet by mouth daily. 30 tablet 0 09/07/2014 at Unknown time  . oxyCODONE (ROXICODONE) 5 MG immediate release tablet Take 1-2 tablets (5-10 mg total) by mouth every 6 (six) hours as needed for severe pain. 60 tablet  0 09/08/2014 at Unknown time  . pantoprazole (PROTONIX) 40 MG tablet Take 1 tablet (40 mg total) by mouth daily. 30 tablet 0 09/08/2014 at Unknown time  . polyethylene glycol (MIRALAX / GLYCOLAX) packet Take 17 g by mouth daily. 30 each 0 09/07/2014 at Unknown time  . predniSONE (DELTASONE) 5 MG tablet Take 1 tablet (5 mg total) by mouth daily. 30 tablet 0 09/07/2014 at Unknown time  . senna-docusate (SENOKOT-S) 8.6-50 MG per tablet Take 1 tablet by mouth 2 (two) times daily as needed for mild constipation. 30 tablet 0 Past Week at Unknown time  . Nutritional Supplements (FEEDING SUPPLEMENT, NEPRO CARB STEADY,) LIQD Take 237 mLs by mouth as needed (missed meal during dialysis.). (Patient not taking: Reported on 09/08/2014) 30 Can 0 Not Taking at Unknown time  . simethicone (MYLICON) 40 BS/9.6GE drops Take 0.6 mLs (40 mg total) by mouth 4 (four) times daily as needed for flatulence. 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 Pt is also ESRD on HD, last HD was yesterday, pt tolerated full session, next anticipated HD is on 12/26.  Today is day #5 vancomycin and zosyn. Blood cultures remain no growth to date.   Goal of Therapy:  Vancomycin trough level 10-15 mcg/ml   Plan:  -Continue vancomycin 500 mg IV qHD -Continue zosyn 2.25 g IV q8h -Vancomycin random level tomorrow morning -F/u duration of therapy   Hughes Better, PharmD, BCPS Clinical Pharmacist Pager: [phone] 09/14/2014 10:34 AM

## 2014-09-14 NOTE — Progress Notes (Signed)
Orthopedic Tech Progress Note Patient Details:  [name] September 23, 1960 [id number] [employer] shoe delivered and fit for size Ortho Devices Type of Ortho Device: Postop shoe/boot, Other (comment) Ortho Device/Splint Location: [employer] Ortho Device/Splint Interventions: Application   Asia R Thompson 09/14/2014, 12:06 PM

## 2014-09-14 NOTE — Plan of Care (Signed)
Problem: Phase I Progression Outcomes Goal: Hemodynamically stable Outcome: Progressing Filed Vitals:    09/13/14 1245 09/13/14 1719 09/13/14 2100 09/14/14 0500  BP: 144/52 130/48 112/36 132/39  Pulse: 90 91 81 85  Temp: 98.4 F (36.9 C) 97.5 F (36.4 C) 98.3 F (36.8 C) 98.6 F (37 C)  TempSrc: Oral Oral Oral Oral  Resp: 20 20 18 18   Height:          Weight:     46 kg (101 lb 6.6 oz)    SpO2: 96% 98% 98% 94%

## 2014-09-14 NOTE — Progress Notes (Signed)
PATIENT DETAILS Name: [name] Age: 53 y.o. Sex: male Date of Birth: [birth year] Admit Date: 09/08/2014 Admitting Physician Berle Mull, MD CHY:IFOYDXAJO,INOMV L, MD  Brief narrative: According to last PN 53 year old male patient with known vascular disease, chronic kidney disease on dialysis, hypertension, chronic steroids for gout brought to the emergency room on 08/16/14 with severe right leg pain, evaluated by vascular surgery, and underwent right femoral to below the knee popliteal artery bypass graft on 11/27. Subsequently patient developed significant left leg swelling, with concerns for left venous stenosis related to his left thigh arteriovenous graft, on 12/3 patient underwent left thigh shuntogram with subsequent venoplasty and stenting of the left common femoral vein.Previous left foot swelling improved remarkably after this procedure. On 12/5 patient developed significant hematochezia, unfortunately developed hemorrhagic shock and required ICU transfer. He received a total of 11 units of PRBC, 4 units of FFP and temporarily required pressors to maintain blood pressure. Aspirin/Plavix were placed on hold. He slowly stabilized, hematochezia completely resolved, on 12/11 he started having brown bowel movements. GI planNED to do colonoscopy, however patient refused this bleeding had resolved.  Aspirin and Plavix were subsequently resumed, and patient was discharged home on 09/05/14. Unfortunately on 09/08/14, he had numerous recurrent hematochezia and presented back to the emergency room and was subsequently admitted. Patient has undergone a colonoscopy on 12/21 which is confirmed pandiverticulosis. No further bleeding since admission. Unfortunately , this hospital course has been complicated by left great toe infection plans are for a toe amputation on 12/22.  Subjective  No Major complaints.  Assessment/Plan: Principal Problem:   Lower GI bleed: Suspect this is secondary to  diverticular disease. Prior GI bleeding in his previous admission was attributed it to probably ischemic colitis, he had refused colonoscopy that time. GI consulted, underwent colonoscopy on 12/22 which confirmed diverticulosis. Per GI ok to resume Plavix.  Active Problems:   Mild acute blood loss anemia: Secondary to above, although hemoglobin was 12.3 on admission, hemoglobin has trended down to 8.9  Monitor CBC, transfuse as needed.   Infected Left Great EHM:CNOB great toe with purulent foul smelling drainage-suspect likely cultprit for leukocytosis on 12/20. Xray shows possible early gas gangrene, on empiric Vanco/Zosyn.Blood culture on 12/20 negative. VVS following, and patient is s/p amputation 12/22 .Leukocytosis has resolved with Abx. - Will transition to oral antibiotics -PT evaluation      ESRD on hemodialysis: Nephrology consulted for dialysis. Regular schedule is TTS    History of CAD with prior drug-eluting stent April 2015:no acute cardiac issues, given recurrent hematochezia aspirin/Plavix held on admission. Colonoscopy demonstrated Diverticulosis. 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    History of hypertension: Currently controlled, cautiously continue with Coreg and amlodipine.    History of gout: Continue with chronic prednisone and colchicine     Penile lesion:Likely necroses area secondary to known vascular diease and recent hemorrhagic shock. Associate discussed case with Dr Matilde Sprang Urology on call on 12/21 who reviewed chart and picture (taken 12/20), and recommended daily wound care and to apply bacitracin.  See picture below.He thought the area will  demarcate and heal slowly. Remains stable  Deconditioning/generalized weakness: Secondary to recent prolonged hospitalization/acute illness-PT evaluation.    Severe protein calorie malnutrition: Continue with supplements  Disposition: Remain inpatient-Home with home health services when ready  Antibiotics:  Cephalexin  Doxycycline   Anti-infectives    Start     Dose/Rate Route Frequency Ordered Stop   09/14/14 1200  doxycycline (VIBRA-TABS) tablet 100 mg     100 mg Oral Every 12 hours 09/14/14 1108     09/14/14 1200  cephALEXin (KEFLEX) capsule 500 mg     500 mg Oral Daily 09/14/14 1108     09/13/14 1500  vancomycin (VANCOCIN) 500 mg in sodium chloride 0.9 % 100 mL IVPB     500 mg100 mL/hr over 60 Minutes Intravenous  Once 09/13/14 1430 09/13/14 1720   09/12/14 1200  vancomycin (VANCOCIN) 500 mg in sodium chloride 0.9 % 100 mL IVPB  Status:  Discontinued     500 mg100 mL/hr over 60 Minutes Intravenous Every T-Th-Sa (Hemodialysis) 09/10/14 1035 09/11/14 1120   09/11/14 1200  vancomycin (VANCOCIN) 500 mg in sodium chloride 0.9 % 100 mL IVPB  Status:  Discontinued     500 mg100 mL/hr over 60 Minutes Intravenous Every Dialysis 09/11/14 1120 09/11/14 1122   09/11/14 1200  vancomycin (VANCOCIN) 500 mg in sodium chloride 0.9 % 100 mL IVPB     500 mg100 mL/hr over 60 Minutes Intravenous Every Mon (Hemodialysis) 09/11/14 1128 09/11/14 1755   09/11/14 1121  vancomycin (VANCOCIN) 500 mg in sodium chloride 0.9 % 100 mL IVPB  Status:  Discontinued     500 mg100 mL/hr over 60 Minutes Intravenous Every Dialysis 09/11/14 1122 09/11/14 1123   09/10/14 1400  clindamycin (CLEOCIN) IVPB 600 mg     600 mg100 mL/hr over 30 Minutes Intravenous 3 times per day 09/10/14 1047 09/11/14 2359   09/10/14 1200  vancomycin (VANCOCIN) IVPB 1000 mg/200 mL premix     1,000 mg200 mL/hr over 60 Minutes Intravenous  Once 09/10/14 1035 09/10/14 1215   09/10/14 1200  piperacillin-tazobactam (ZOSYN) IVPB 2.25 g   Status:  Discontinued     2.25 g100 mL/hr over 30 Minutes Intravenous Every 8 hours 09/10/14 1035 09/14/14 1108      DVT Prophylaxis: SCD's  Code Status: Full code   Family Communication None at bedside- patient is awake/alert and understanding of above plan.  Procedures:  None  CONSULTS:  nephrology   MEDICATIONS: Scheduled Meds: . amLODipine  10 mg Oral Daily  . atorvastatin  40 mg Oral q1800  . bacitracin   Topical BID  . calcitRIOL  2.75 mcg Oral Q T,Th,Sa-HD  . calcium acetate  2,001 mg Oral TID WC  . carvedilol  6.25 mg Oral BID WC  . cephALEXin  500 mg Oral Daily  . cinacalcet  60 mg Oral Q breakfast  . clopidogrel  75 mg Oral Daily  . colchicine  0.3 mg Oral Once per day on Mon Thu  . darbepoetin (ARANESP) injection - DIALYSIS  60 mcg Intravenous Q Thu-HD  . doxycycline  100 mg Oral Q12H  . feeding supplement (RESOURCE BREEZE)  1 Container Oral TID BM  . ferric gluconate (FERRLECIT/NULECIT) IV  125 mg Intravenous Q T,Th,Sa-HD  . ketorolac  30 mg Intravenous 4 times per day  . pantoprazole  40 mg Oral BID  . predniSONE  5 mg Oral Daily  . sodium chloride  3 mL Intravenous Q12H   Continuous Infusions:   PRN Meds:.sodium chloride, sodium chloride, acetaminophen, acetaminophen, metoCLOPramide, ondansetron **OR** ondansetron (ZOFRAN)  IV, oxyCODONE    PHYSICAL EXAM: Vital signs in last 24 hours: Filed Vitals:   09/13/14 2100 09/14/14 0500 09/14/14 1000 09/14/14 1435  BP: 112/36 132/39 98/70 112/42  Pulse: 81 85 87 78  Temp: 98.3 F (36.8 C) 98.6 F (37 C) 98.4 F (36.9 C) 99.4 F (37.4 C)  TempSrc: Oral Oral Oral Oral  Resp: 18 18 18 18   Height:      Weight: 46 kg (101 lb 6.6 oz)     SpO2: 98% 94% 100% 97%    Weight change: 1.397 kg (3 lb 1.3 oz) Filed Weights   09/13/14 0740 09/13/14 1215 09/13/14 2100  Weight: 47.8 kg (105 lb 6.1 oz) 46 kg (101 lb 6.6 oz) 46 kg (101 lb 6.6 oz)   Body mass index is 13.75 kg/(m^2).   Gen Exam: Awake and  alert with clear speech.   Neck: Supple, No JVD.   Chest: B/L Clear.   CVS: No edema Abdomen: non tender, non distended.  Extremities: no edema, lower extremities warm to touch.Foul smelling ulcer in left foot with purulent drainage.Dry gangrene in several toes in right foot Neurologic: Non Focal.   Skin: No Rash.   Wounds: see below     Right Foot with dry gangrene (below)   Left great toe (below)   Intake/Output from previous day:  Intake/Output Summary (Last 24 hours) at 09/14/14 1504 Last data filed at 09/14/14 0900  Gross per 24 hour  Intake    120 ml  Output      0 ml  Net    120 ml     LAB RESULTS: CBC  Recent Labs Lab 09/08/14 2121  09/10/14 0433 09/11/14 0919 09/11/14 1445 09/12/14 0520 09/13/14 0755  WBC 10.2  < > 21.1* 10.9* 8.7 8.1 9.2  HGB 12.3*  < > 10.4* 9.5* 8.9* 9.2* 8.9*  HCT 40.0  < > 33.6* 30.5* 28.8* 29.1* 28.5*  PLT 206  < > 236 160 170 140* 131*  MCV 91.7  < > 89.4 90.5 88.3 90.1 90.2  MCH 28.2  < > 27.7 28.2 27.3 28.5 28.2  MCHC 30.8  < > 31.0 31.1 30.9 31.6 31.2  RDW 16.6*  < > 16.7* 16.2* 16.2* 16.2* 16.4*  LYMPHSABS 1.5  --   --   --   --   --   --   MONOABS 1.3*  --   --   --   --   --   --   EOSABS 0.3  --   --   --   --   --   --   BASOSABS 0.0  --   --   --   --   --   --   < > = values in this interval not displayed.  Chemistries   Recent Labs Lab 09/09/14 1649 09/11/14 0919 09/11/14 1445 09/12/14 0520 09/13/14 0756  NA 141 138 138 140  140 135  K 3.9 4.8 4.5 4.4  4.2 3.8  CL 98 98 99 103  102 99  CO2 29 27 26 25  28 28   GLUCOSE 65* 92 76 85  90 104*  BUN 31* 18 19 6  6 10   CREATININE 7.03* 5.50* 5.53* 3.74*  3.75* 5.82*  CALCIUM 7.7* 7.8* 7.5* 7.8*  7.8* 7.3*    CBG: No results for input(s): GLUCAP in the last 168 hours.  GFR Estimated Creatinine Clearance: 9.3 mL/min (by C-G formula based on Cr of 5.82).  Coagulation profile  Recent Labs Lab 09/08/14 2121 09/09/14 0426  INR 1.16 1.22     Cardiac Enzymes No results for input(s): CKMB, TROPONINI, MYOGLOBIN in the last 168 hours.  Invalid input(s): CK  Invalid input(s): POCBNP No results for input(s): DDIMER in the last 72 hours. No results for input(s): HGBA1C in the last 72 hours. No results for input(s): CHOL, HDL, LDLCALC, TRIG, CHOLHDL, LDLDIRECT in the last 72 hours. No results for input(s): TSH, T4TOTAL, T3FREE, THYROIDAB in the last 72 hours.  Invalid input(s): FREET3 No results for input(s): VITAMINB12, FOLATE, FERRITIN, TIBC, IRON, RETICCTPCT in the last 72 hours. No results for input(s): LIPASE, AMYLASE in the last 72 hours.  Urine Studies No results for input(s): UHGB, CRYS in the last 72 hours.  Invalid input(s): UACOL, UAPR, USPG, UPH, UTP, UGL, UKET, UBIL, UNIT, UROB, ULEU, UEPI, UWBC, URBC, UBAC, CAST, UCOM, BILUA  MICROBIOLOGY: Recent Results (from the past 240 hour(s))  Culture, blood (routine x 2)     Status: None (Preliminary result)   Collection Time: 09/10/14 12:20 PM  Result Value Ref Range Status   Specimen Description BLOOD RIGHT WRIST  Final   Special Requests BOTTLES DRAWN AEROBIC ONLY Jette  Final   Culture  Setup Time   Final    09/10/2014 18:32 Performed at [facility]    Culture   Final           BLOOD CULTURE RECEIVED NO GROWTH TO DATE CULTURE WILL BE HELD FOR 5 DAYS BEFORE ISSUING A FINAL NEGATIVE REPORT Performed at [facility]    Report Status PENDING  Incomplete  Culture, blood (routine x 2)     Status: None (Preliminary result)   Collection Time: 09/10/14 12:30 PM  Result Value Ref Range Status   Specimen Description BLOOD LEFT WRIST  Final   Special Requests BOTTLES DRAWN AEROBIC ONLY 10CC  Final   Culture  Setup Time   Final    09/10/2014 18:32 Performed at [facility]    Culture   Final           BLOOD CULTURE RECEIVED NO GROWTH TO DATE CULTURE WILL BE HELD FOR 5 DAYS BEFORE ISSUING A FINAL NEGATIVE REPORT Performed at [facility]    Report Status PENDING  Incomplete  Surgical pcr screen     Status: None   Collection Time: 09/12/14  2:36 PM  Result Value Ref Range Status   MRSA, PCR NEGATIVE NEGATIVE Final   Staphylococcus aureus NEGATIVE NEGATIVE Final    Comment:        The Xpert SA Assay (FDA approved for NASAL specimens in patients over 25 years of age), is one component of a comprehensive surveillance program.  Test performance has been validated by [employer] for patients greater than or equal to 70 year old. It is not intended to diagnose infection nor to guide or monitor treatment.     RADIOLOGY STUDIES/RESULTS: Dg Chest 2 View  09/10/2014   CLINICAL DATA:  Leukocytosis  EXAM: CHEST  2 VIEW  COMPARISON:  08/29/2014  FINDINGS: Bilateral mild apical pleural calcifications. Left apical calcifications which are new compared with prior chest x-ray dated 07/01/2012. There is a small-moderate left pleural effusion. There is no right pleural effusion. There is no pneumothorax. The cardiomediastinal silhouette is stable. No acute osseous abnormality.  IMPRESSION: 1. Small either moderate left pleural effusion. Left apical calcifications again noted which are new since the prior exam of 07/01/2012. These are likely postinflammatory but can  be seen in the setting of tuberculosis. Correlate with PPD or QuantiFERON test.   Electronically Signed   By: Kathreen Devoid   On: 09/10/2014 10:35   Ct Abdomen Pelvis W Contrast  08/27/2014   CLINICAL DATA:  Acute onset of bright red blood per rectum, with periumbilical cramping abdominal pain. Initial encounter.  EXAM: CT ABDOMEN AND PELVIS WITH CONTRAST  TECHNIQUE: Multidetector CT imaging of the abdomen and pelvis was performed using the standard protocol following bolus administration of intravenous contrast.  CONTRAST:  165mL OMNIPAQUE IOHEXOL 300 MG/ML  SOLN  COMPARISON:  CT of the abdomen and pelvis from 11/10/2012  FINDINGS: Trace bilateral pleural effusions are  seen, with bibasilar atelectasis.  A nonspecific 6 mm hypodensity within the left hepatic lobe is of uncertain significance. The liver is otherwise unremarkable. The spleen is mildly bulky, but remains normal in length. There is persistent chronic gallbladder wall thickening. The gallbladder is difficult to fully assess given trace ascites tracking about the liver.  The pancreas and adrenal glands are unremarkable.  The patient's native kidneys are markedly atrophic, with scattered bilateral renal cysts of varying size and attenuation. Scattered associated calcifications are seen. There is no evidence of hydronephrosis. No obstructing renal stones are seen.  There is marked fatty infiltration with regard to the transplant kidney at the left iliac fossa, with marked thinning of the renal parenchyma. Diffuse associated vascular calcifications are seen. This appearance is stable from 2014.  There is mild diffuse mesenteric and omental edema, nonspecific in appearance.  The stomach is within normal limits. No acute vascular abnormalities are seen. Diffuse calcification is seen along the abdominal aorta and its branches. Extensive diffuse vascular calcification is noted throughout the abdomen and pelvis.  The patient appears to be status post resection of much of the bowel. Remaining small bowel is grossly unremarkable. A distended anastomosis is noted at the right hemipelvis; this corresponds to the site of the prior right pelvic catheter, which has been removed.  The appendix is not definitely characterized; there is no evidence for appendicitis. Contrast progresses to the level of the proximal sigmoid colon. There is scattered diverticulosis along the transverse colon. Stool is noted filling the distal sigmoid colon and rectum.  There is soft tissue inflammation about the distal sigmoid colon and rectum, raising question for proctitis. Diffuse presacral stranding is also noted, new from the prior study.  The bladder  is not well assessed. Soft tissue density about the expected location of the bladder is relatively stable in appearance. 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 This is perhaps slightly more prominent than on the prior study. No inguinal lymphadenopathy is seen.  No acute osseous abnormalities are identified.  IMPRESSION: 1. Soft tissue inflammation about the distal sigmoid colon and rectum, with diffuse presacral stranding, new from the prior study. This raises concern for proctitis, which may explain the patient's bright red blood per rectum. Stool noted filling the distal sigmoid colon and rectum. 2. Scattered diverticulosis along the transverse colon, without evidence of diverticulitis; the colon is otherwise grossly unremarkable. 3. Trace bilateral pleural effusions, with bibasilar atelectasis. 4. Marked atrophy of the patient's bilateral native kidneys, with bilateral renal cysts and scattered calcifications. Marked fatty infiltration of the transplant kidney at the left iliac fossa, with marked thinning of the renal parenchyma. This appearance is stable from 2014. 5. Stable appearance to soft tissue density about the expected location of  the bladder. 6. 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 7. Diffuse calcification along the abdominal aorta and its branches. Extensive diffuse vascular calcification seen throughout the abdomen and pelvis. 8. Nonspecific 6 mm hypodensity within the left hepatic lobe, new from prior studies. 9. Persistent chronic gallbladder wall thickening; gallbladder difficult to fully assess given trace ascites about the liver, but this appearance is relatively stable. 10. Mild nonspecific diffuse mesenteric and omental edema.   Electronically Signed   By: Garald Balding M.D.   On: 08/27/2014 01:30   Dg Chest Port 1 View  08/29/2014   CLINICAL DATA:  Central line placement.  EXAM: PORTABLE CHEST - 1 VIEW  COMPARISON:  07/03/2012  FINDINGS: Interval placement of a left central venous catheter. Tip projects to the left mediastinum over the aortic arch. This could be in the brachiocephalic vein, in a normal variant left superior vena cava, or an arterial branch. No pneumothorax. Calcification in the left apex is likely postinflammatory. This is new since previous study. Heart size is normal. Hemidiaphragms are not included within the field of view.  IMPRESSION: Nonspecific placement of left central venous catheter to the left of midline, suggesting placement either in the brachiocephalic vein, left superior vena cava, or arterial branch. No pneumothorax.   Electronically Signed   By: Lucienne Capers M.D.   On: 08/29/2014 02:17   Dg Abd Portable 1v  08/26/2014   CLINICAL DATA:  Acute GI bleed  EXAM: PORTABLE ABDOMEN - 1 VIEW  COMPARISON:  CT abdomen pelvis dated 11/10/2012  FINDINGS: Nonspecific but nonobstructive bowel gas pattern.  Surgical clips overlying the pelvis.  Vascular calcifications.  IMPRESSION: Unremarkable abdominal radiograph.   Electronically Signed   By: Julian Hy M.D.   On: 08/26/2014 22:30   Dg Toe Great Left  09/10/2014   ADDENDUM REPORT: 09/10/2014 10:44  ADDENDUM: These results were called by telephone at the time of interpretation on 09/10/2014 at 10:44 am to Dr. Oren Binet , who verbally acknowledged these results.   Electronically Signed   By: Lowella Grip M.D.   On: 09/10/2014 10:44   09/10/2014   CLINICAL DATA:  Loss of blood flow to first toe ; pain  EXAM: LEFT FIRST TOE  COMPARISON:  None.  FINDINGS: Frontal, oblique, and lateral views were obtained. There is soft tissue swelling along the dorsal distal aspect of the first digit. There are scattered foci of air in the soft tissues, concerning for potential soft tissue  infection in this area. There is no erosive change or bony destruction. No fracture or dislocation. Joint spaces appear intact. There is extensive arterial vascular calcification.  IMPRESSION: Soft tissue air is noted in the distal first digit. This finding is concerning for potential infection. Early gas gangrene could present in this manner and must be of concern. No bony destruction. No fracture or dislocation. Extensive arterial vascular calcification. Suspect diabetes mellitus.  Electronically Signed: By: Lowella Grip M.D. On: 09/10/2014 10:39    Velvet Bathe, MD  [facility] Pager:[phone]  If 7PM-7AM, please contact night-coverage [email or web address] Password [email or web address] 09/14/2014, 3:04 PM   LOS: 6 days

## 2014-09-15 DIAGNOSIS — K922 Gastrointestinal hemorrhage, unspecified: Secondary | ICD-10-CM

## 2014-09-15 LAB — CBC
HCT: 23.7 % — ABNORMAL LOW (ref 39.0–52.0)
Hemoglobin: 7.3 g/dL — ABNORMAL LOW (ref 13.0–17.0)
MCH: 26.7 pg (ref 26.0–34.0)
MCHC: 30.8 g/dL (ref 30.0–36.0)
MCV: 86.8 fL (ref 78.0–100.0)
Platelets: 147 10*3/uL — ABNORMAL LOW (ref 150–400)
RBC: 2.73 MIL/uL — ABNORMAL LOW (ref 4.22–5.81)
RDW: 16.4 % — AB (ref 11.5–15.5)
WBC: 12.6 10*3/uL — AB (ref 4.0–10.5)

## 2014-09-15 LAB — HEMOGLOBIN AND HEMATOCRIT, BLOOD
HCT: 30.6 % — ABNORMAL LOW (ref 39.0–52.0)
Hemoglobin: 10.1 g/dL — ABNORMAL LOW (ref 13.0–17.0)

## 2014-09-15 LAB — PREPARE RBC (CROSSMATCH)

## 2014-09-15 MED ORDER — SODIUM CHLORIDE 0.9 % IV SOLN: Freq: Once | INTRAVENOUS | Status: AC

## 2014-09-15 MED ORDER — SODIUM CHLORIDE 0.9 % IV SOLN
Freq: Once | INTRAVENOUS | Status: AC
  Administered 2014-09-15: 06:00:00 via INTRAVENOUS

## 2014-09-15 MED ORDER — DARBEPOETIN ALFA 60 MCG/0.3ML IJ SOSY
60.0000 ug | PREFILLED_SYRINGE | INTRAMUSCULAR | Status: DC
  Administered 2014-09-16: 60 ug via INTRAVENOUS

## 2014-09-15 MED ORDER — SODIUM CHLORIDE 0.9 % IV SOLN
Freq: Once | INTRAVENOUS | Status: AC
  Administered 2014-09-15: 250 mL via INTRAVENOUS

## 2014-09-15 NOTE — Progress Notes (Signed)
Pt's most recent hgb is 8.1. Still having frequent bloody stools with large clots after recent hgb. BP is 113/33, Alene Mires MD made aware. No new orders at this time. Will continue to monitor pt.

## 2014-09-15 NOTE — Progress Notes (Signed)
Text page to DR [place] aware that pt has had 5 to 6 bloody BM's today. Has some small clots and red noted. Will draw CBC in 1 hour post 2nd UPC. DR Wendee Beavers will follow post draw.

## 2014-09-15 NOTE — Progress Notes (Signed)
PATIENT DETAILS Name: [name] Age: 53 y.o. Sex: male Date of Birth: 07/17/1961 Admit Date: 09/08/2014 Admitting Physician Berle Mull, MD OIZ:[id number],PJASN L, MD  Brief narrative: According to last PN 53 year old male patient with known vascular disease, chronic kidney disease on dialysis, hypertension, chronic steroids for gout brought to the emergency room on 08/16/14 with severe right leg pain, evaluated by vascular surgery, and underwent right femoral to below the knee popliteal artery bypass graft on 11/27. Subsequently patient developed significant left leg swelling, with concerns for left venous stenosis related to his left thigh arteriovenous graft, on 12/3 patient underwent left thigh shuntogram with subsequent venoplasty and stenting of the left common femoral vein.Previous left foot swelling improved remarkably after this procedure. On 12/5 patient developed significant hematochezia, unfortunately developed hemorrhagic shock and required ICU transfer. He received a total of 11 units of PRBC, 4 units of FFP and temporarily required pressors to maintain blood pressure. Aspirin/Plavix were placed on hold. He slowly stabilized, hematochezia completely resolved, on 12/11 he started having brown bowel movements. GI planNED to do colonoscopy, however patient refused this bleeding had resolved.  Aspirin and Plavix were subsequently resumed, and patient was discharged home on 09/05/14. Unfortunately on 09/08/14, he had numerous recurrent hematochezia and presented back to the emergency room and was subsequently admitted. Patient has undergone a colonoscopy on 12/21 which is confirmed pandiverticulosis. No further bleeding since admission. Unfortunately , this hospital course has been complicated by left great toe infection plans are for a toe amputation on 12/22.  Subjective  No Major complaints.  Assessment/Plan: Principal Problem:   Lower GI bleed: Suspect this is secondary to  diverticular disease. Prior GI bleeding in his previous admission was attributed it to probably ischemic colitis, he had refused colonoscopy that time. GI consulted, underwent colonoscopy on 12/22 which confirmed diverticulosis.  - Will discontinue plavix - Pt will get another blood transfusion (3 most recently)  Active Problems:   Mild acute blood loss anemia: Secondary to above, although hemoglobin was 12.3 on admission. Hgb levels have trended down with blood loss - Will reassess cbc after transfusion   Infected Left Great KNL:ZJQB great toe with purulent foul smelling drainage-suspect likely cultprit for leukocytosis on 12/20. Xray shows possible early gas gangrene, on empiric Vanco/Zosyn.Blood culture on 12/20 negative. VVS following, and patient is s/p amputation 12/22 .Leukocytosis has resolved with Abx. - Transitioned to oral antibiotics -PT evaluation      ESRD on hemodialysis: Nephrology consulted for dialysis. Regular schedule is TTS    History of CAD with prior drug-eluting stent April 2015:no acute cardiac issues, given recurrent hematochezia aspirin/Plavix held on admission. Colonoscopy demonstrated Diverticulosis. 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    History of hypertension: Currently controlled, cautiously continue with Coreg and amlodipine.    History of gout: Continue with chronic prednisone and colchicine     Penile lesion:Likely necroses area secondary to known vascular diease and recent hemorrhagic shock. Associate discussed case with Dr Matilde Sprang Urology on call on 12/21 who reviewed chart and picture (taken 12/20), and recommended daily wound care  and to apply bacitracin.  See picture below.He thought  the area will demarcate and heal slowly. Remains stable    Deconditioning/generalized weakness: Secondary to recent prolonged hospitalization/acute illness -PT evaluation.    Severe protein calorie malnutrition: Continue with supplements  Disposition: Remain inpatient-Home with home health services when ready  Antibiotics:  Cephalexin  Doxycycline   Anti-infectives    Start     Dose/Rate Route Frequency Ordered Stop   09/14/14 1200  doxycycline (VIBRA-TABS) tablet 100 mg     100 mg Oral Every 12 hours 09/14/14 1108     09/14/14 1200  cephALEXin (KEFLEX) capsule 500 mg     500 mg Oral Daily 09/14/14 1108     09/13/14 1500  vancomycin (VANCOCIN) 500 mg in sodium chloride 0.9 % 100 mL IVPB     500 mg100 mL/hr over 60 Minutes Intravenous  Once 09/13/14 1430 09/13/14 1720   09/12/14 1200  vancomycin (VANCOCIN) 500 mg in sodium chloride 0.9 % 100 mL IVPB  Status:  Discontinued     500 mg100 mL/hr over 60 Minutes Intravenous Every T-Th-Sa (Hemodialysis) 09/10/14 1035 09/11/14 1120   09/11/14 1200  vancomycin (VANCOCIN) 500 mg in sodium chloride 0.9 % 100 mL IVPB  Status:  Discontinued     500 mg100 mL/hr over 60 Minutes Intravenous Every Dialysis 09/11/14 1120 09/11/14 1122   09/11/14 1200  vancomycin (VANCOCIN) 500 mg in sodium chloride 0.9 % 100 mL IVPB     500 mg100 mL/hr over 60 Minutes Intravenous Every Mon (Hemodialysis) 09/11/14 1128 09/11/14 1755   09/11/14 1121  vancomycin (VANCOCIN) 500 mg in sodium chloride 0.9 % 100 mL IVPB  Status:  Discontinued     500 mg100 mL/hr over 60 Minutes Intravenous Every Dialysis 09/11/14 1122 09/11/14 1123   09/10/14 1400  clindamycin (CLEOCIN) IVPB 600 mg     600 mg100 mL/hr over 30 Minutes Intravenous 3 times per day 09/10/14 1047 09/11/14 2359   09/10/14 1200  vancomycin (VANCOCIN) IVPB 1000 mg/200 mL premix     1,000 mg200 mL/hr over 60 Minutes Intravenous  Once 09/10/14 1035 09/10/14  1215   09/10/14 1200  piperacillin-tazobactam (ZOSYN) IVPB 2.25 g  Status:  Discontinued     2.25 g100 mL/hr over 30 Minutes Intravenous Every 8 hours 09/10/14 1035 09/14/14 1108      DVT Prophylaxis: SCD's  Code Status: Full code   Family Communication None at bedside- patient is awake/alert and understanding of above plan.  Procedures:  None  CONSULTS:  nephrology   MEDICATIONS: Scheduled Meds: . sodium chloride   Intravenous Once  . amLODipine  10 mg Oral Daily  . atorvastatin  40 mg Oral q1800  . bacitracin   Topical BID  . calcitRIOL  2.75 mcg Oral Q T,Th,Sa-HD  . calcium acetate  2,001 mg Oral TID WC  . carvedilol  6.25 mg Oral BID WC  . cephALEXin  500 mg Oral Daily  . cinacalcet  60 mg Oral Q breakfast  . clopidogrel  75 mg Oral Daily  . colchicine  0.3 mg Oral Once per day on Mon Thu  . [START ON 09/23/2014] darbepoetin (ARANESP) injection - DIALYSIS  60 mcg Intravenous Q Sat-HD  . doxycycline  100 mg Oral Q12H  . feeding supplement (RESOURCE BREEZE)  1 Container Oral TID BM  . ferric gluconate (FERRLECIT/NULECIT) IV  125 mg Intravenous Q T,Th,Sa-HD  . pantoprazole  40 mg Oral BID  . predniSONE  5 mg Oral Daily  . sodium chloride  3 mL Intravenous Q12H   Continuous Infusions:  PRN Meds:.sodium chloride, sodium chloride, acetaminophen, acetaminophen, metoCLOPramide, ondansetron **OR** ondansetron (ZOFRAN) IV, oxyCODONE    PHYSICAL EXAM: Vital signs in last 24 hours: Filed Vitals:   09/15/14 0905 09/15/14 0930 09/15/14 0940 09/15/14 0945  BP: 89/51  98/44 103/40  Pulse:      Temp: 97.8 F (36.6 C) 97.8 F (36.6 C)  97.8 F (36.6 C)  TempSrc: Axillary Axillary  Axillary  Resp:      Height:      Weight:      SpO2:    98%    Weight change: -1.76 kg (-3 lb 14.1 oz) Filed Weights   09/13/14 2100 09/14/14 2141 09/15/14 0324  Weight: 46 kg (101 lb 6.6 oz) 46.04 kg (101 lb 8 oz) 44.3 kg (97 lb 10.6 oz)   Body mass index is 13.63 kg/(m^2).   Gen  Exam: Awake and alert with clear speech.   Neck: Supple, No JVD.   Chest: B/L Clear.   CVS: No edema Abdomen: non tender, non distended.  Extremities: no edema, lower extremities warm to touch.Foul smelling ulcer in left foot with purulent drainage.Dry gangrene in several toes in right foot Neurologic: Non Focal.   Skin: No Rash.   Wounds: see below     Right Foot with dry gangrene (below)   Left great toe (below)   Intake/Output from previous day:  Intake/Output Summary (Last 24 hours) at 09/15/14 1144 Last data filed at 09/15/14 0933  Gross per 24 hour  Intake    522 ml  Output      5 ml  Net    517 ml     LAB RESULTS: CBC  Recent Labs Lab 09/08/14 2121  09/11/14 0919 09/11/14 1445 09/12/14 0520 09/13/14 0755 09/14/14 2219 09/15/14 0302  WBC 10.2  < > 10.9* 8.7 8.1 9.2  --  12.6*  HGB 12.3*  < > 9.5* 8.9* 9.2* 8.9* 8.1* 7.3*  HCT 40.0  < > 30.5* 28.8* 29.1* 28.5* 26.3* 23.7*  PLT 206  < > 160 170 140* 131*  --  147*  MCV 91.7  < > 90.5 88.3 90.1 90.2  --  86.8  MCH 28.2  < > 28.2 27.3 28.5 28.2  --  26.7  MCHC 30.8  < > 31.1 30.9 31.6 31.2  --  30.8  RDW 16.6*  < > 16.2* 16.2* 16.2* 16.4*  --  16.4*  LYMPHSABS 1.5  --   --   --   --   --   --   --   MONOABS 1.3*  --   --   --   --   --   --   --   EOSABS 0.3  --   --   --   --   --   --   --   BASOSABS 0.0  --   --   --   --   --   --   --   < > = values in this interval not displayed.  Chemistries   Recent Labs Lab 09/09/14 1649 09/11/14 0919 09/11/14 1445 09/12/14 0520 09/13/14 0756  NA 141 138 138 140  140 135  K 3.9 4.8 4.5 4.4  4.2 3.8  CL 98 98 99 103  102 99  CO2 29 27 26 25  28 28   GLUCOSE 65* 92 76 85  90 104*  BUN 31* 18 19 6  6 10   CREATININE 7.03* 5.50* 5.53* 3.74*  3.75* 5.82*  CALCIUM 7.7*  7.8* 7.5* 7.8*  7.8* 7.3*    CBG: No results for input(s): GLUCAP in the last 168 hours.  GFR Estimated Creatinine Clearance: 9 mL/min (by C-G formula based on Cr of  5.82).  Coagulation profile  Recent Labs Lab 09/08/14 2121 09/09/14 0426  INR 1.16 1.22    Cardiac Enzymes No results for input(s): CKMB, TROPONINI, MYOGLOBIN in the last 168 hours.  Invalid input(s): CK  Invalid input(s): POCBNP No results for input(s): DDIMER in the last 72 hours. No results for input(s): HGBA1C in the last 72 hours. No results for input(s): CHOL, HDL, LDLCALC, TRIG, CHOLHDL, LDLDIRECT in the last 72 hours. No results for input(s): TSH, T4TOTAL, T3FREE, THYROIDAB in the last 72 hours.  Invalid input(s): FREET3 No results for input(s): VITAMINB12, FOLATE, FERRITIN, TIBC, IRON, RETICCTPCT in the last 72 hours. No results for input(s): LIPASE, AMYLASE in the last 72 hours.  Urine Studies No results for input(s): UHGB, CRYS in the last 72 hours.  Invalid input(s): UACOL, UAPR, USPG, UPH, UTP, UGL, UKET, UBIL, UNIT, UROB, ULEU, UEPI, UWBC, URBC, UBAC, CAST, UCOM, BILUA  MICROBIOLOGY: Recent Results (from the past 240 hour(s))  Culture, blood (routine x 2)     Status: None (Preliminary result)   Collection Time: 09/10/14 12:20 PM  Result Value Ref Range Status   Specimen Description BLOOD RIGHT WRIST  Final   Special Requests BOTTLES DRAWN AEROBIC ONLY [place]  Final   Culture  Setup Time   Final    09/10/2014 18:32 Performed at [facility]    Culture   Final           BLOOD CULTURE RECEIVED NO GROWTH TO DATE CULTURE WILL BE HELD FOR 5 DAYS BEFORE ISSUING A FINAL NEGATIVE REPORT Performed at [facility]    Report Status PENDING  Incomplete  Culture, blood (routine x 2)     Status: None (Preliminary result)   Collection Time: 09/10/14 12:30 PM  Result Value Ref Range Status   Specimen Description BLOOD LEFT WRIST  Final   Special Requests BOTTLES DRAWN AEROBIC ONLY 10CC  Final   Culture  Setup Time   Final    09/10/2014 18:32 Performed at [facility]    Culture   Final           BLOOD CULTURE RECEIVED NO GROWTH TO DATE  CULTURE WILL BE HELD FOR 5 DAYS BEFORE ISSUING A FINAL NEGATIVE REPORT Performed at [facility]    Report Status PENDING  Incomplete  Surgical pcr screen     Status: None   Collection Time: 09/12/14  2:36 PM  Result Value Ref Range Status   MRSA, PCR NEGATIVE NEGATIVE Final   Staphylococcus aureus NEGATIVE NEGATIVE Final    Comment:        The Xpert SA Assay (FDA approved for NASAL specimens in patients over 51 years of age), is one component of a comprehensive surveillance program.  Test performance has been validated by [employer] for patients greater than or equal to 50 year old. It is not intended to diagnose infection nor to guide or monitor treatment.     RADIOLOGY STUDIES/RESULTS: Dg Chest 2 View  09/10/2014   CLINICAL DATA:  Leukocytosis  EXAM: CHEST  2 VIEW  COMPARISON:  08/29/2014  FINDINGS: Bilateral mild apical pleural calcifications. Left apical calcifications which are new compared with prior chest x-ray dated 07/01/2012. There is a small-moderate left pleural effusion. There is no right pleural effusion. There is no  pneumothorax. The cardiomediastinal silhouette is stable. No acute osseous abnormality.  IMPRESSION: 1. Small either moderate left pleural effusion. Left apical calcifications again noted which are new since the prior exam of 07/01/2012. These are likely postinflammatory but can be seen in the setting of tuberculosis. Correlate with PPD or QuantiFERON test.   Electronically Signed   By: Kathreen Devoid   On: 09/10/2014 10:35   Ct Abdomen Pelvis W Contrast  08/27/2014   CLINICAL DATA:  Acute onset of bright red blood per rectum, with periumbilical cramping abdominal pain. Initial encounter.  EXAM: CT ABDOMEN AND PELVIS WITH CONTRAST  TECHNIQUE: Multidetector CT imaging of the abdomen and pelvis was performed using the standard protocol following bolus administration of intravenous contrast.  CONTRAST:  173mL OMNIPAQUE IOHEXOL 300 MG/ML  SOLN   COMPARISON:  CT of the abdomen and pelvis from 11/10/2012  FINDINGS: Trace bilateral pleural effusions are seen, with bibasilar atelectasis.  A nonspecific 6 mm hypodensity within the left hepatic lobe is of uncertain significance. The liver is otherwise unremarkable. The spleen is mildly bulky, but remains normal in length. There is persistent chronic gallbladder wall thickening. The gallbladder is difficult to fully assess given trace ascites tracking about the liver.  The pancreas and adrenal glands are unremarkable.  The patient's native kidneys are markedly atrophic, with scattered bilateral renal cysts of varying size and attenuation. Scattered associated calcifications are seen. There is no evidence of hydronephrosis. No obstructing renal stones are seen.  There is marked fatty infiltration with regard to the transplant kidney at the left iliac fossa, with marked thinning of the renal parenchyma. Diffuse associated vascular calcifications are seen. This appearance is stable from 2014.  There is mild diffuse mesenteric and omental edema, nonspecific in appearance.  The stomach is within normal limits. No acute vascular abnormalities are seen. Diffuse calcification is seen along the abdominal aorta and its branches. Extensive diffuse vascular calcification is noted throughout the abdomen and pelvis.  The patient appears to be status post resection of much of the bowel. Remaining small bowel is grossly unremarkable. A distended anastomosis is noted at the right hemipelvis; this corresponds to the site of the prior right pelvic catheter, which has been removed.  The appendix is not definitely characterized; there is no evidence for appendicitis. Contrast progresses to the level of the proximal sigmoid colon. There is scattered diverticulosis along the transverse colon. Stool is noted filling the distal sigmoid colon and rectum.  There is soft tissue inflammation about the distal sigmoid colon and rectum, raising  question for proctitis. Diffuse presacral stranding is also noted, new from the prior study.  The bladder is not well assessed. Soft tissue density about the expected location of the bladder is relatively stable in appearance. 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 This is perhaps slightly more prominent than on the prior study. No inguinal lymphadenopathy is seen.  No acute osseous abnormalities are identified.  IMPRESSION: 1. Soft tissue inflammation about the distal sigmoid colon and rectum, with diffuse presacral stranding, new from the prior study. This raises concern for proctitis, which may explain the patient's bright red blood per rectum. Stool noted filling the distal sigmoid colon and rectum. 2. Scattered diverticulosis along the transverse colon, without evidence of diverticulitis; the colon is otherwise grossly unremarkable. 3. Trace bilateral pleural effusions, with bibasilar atelectasis. 4. Marked atrophy of the patient's bilateral native kidneys, with bilateral renal cysts and  scattered calcifications. Marked fatty infiltration of the transplant kidney at the left iliac fossa, with marked thinning of the renal parenchyma. This appearance is stable from 2014. 5. Stable appearance to soft tissue density about the expected location of the bladder. 6. 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 7. Diffuse calcification along the abdominal aorta and its branches. Extensive diffuse vascular calcification seen throughout the abdomen and pelvis. 8. Nonspecific 6 mm hypodensity within the left hepatic lobe, new from prior studies. 9. Persistent chronic gallbladder wall thickening; gallbladder difficult to fully assess given trace ascites about the liver, but this appearance is relatively  stable. 10. Mild nonspecific diffuse mesenteric and omental edema.   Electronically Signed   By: Garald Balding M.D.   On: 08/27/2014 01:30   Dg Chest Port 1 View  08/29/2014   CLINICAL DATA:  Central line placement.  EXAM: PORTABLE CHEST - 1 VIEW  COMPARISON:  07/03/2012  FINDINGS: Interval placement of a left central venous catheter. Tip projects to the left mediastinum over the aortic arch. This could be in the brachiocephalic vein, in a normal variant left superior vena cava, or an arterial branch. No pneumothorax. Calcification in the left apex is likely postinflammatory. This is new since previous study. Heart size is normal. Hemidiaphragms are not included within the field of view.  IMPRESSION: Nonspecific placement of left central venous catheter to the left of midline, suggesting placement either in the brachiocephalic vein, left superior vena cava, or arterial branch. No pneumothorax.   Electronically Signed   By: Lucienne Capers M.D.   On: 08/29/2014 02:17   Dg Abd Portable 1v  08/26/2014   CLINICAL DATA:  Acute GI bleed  EXAM: PORTABLE ABDOMEN - 1 VIEW  COMPARISON:  CT abdomen pelvis dated 11/10/2012  FINDINGS: Nonspecific but nonobstructive bowel gas pattern.  Surgical clips overlying the pelvis.  Vascular calcifications.  IMPRESSION: Unremarkable abdominal radiograph.   Electronically Signed   By: Julian Hy M.D.   On: 08/26/2014 22:30   Dg Toe Great Left  09/10/2014   ADDENDUM REPORT: 09/10/2014 10:44  ADDENDUM: These results were called by telephone at the time of interpretation on 09/10/2014 at 10:44 am to Dr. Oren Binet , who verbally acknowledged these results.   Electronically Signed   By: Lowella Grip M.D.   On: 09/10/2014 10:44   09/10/2014   CLINICAL DATA:  Loss of blood flow to first toe ; pain  EXAM: LEFT FIRST TOE  COMPARISON:  None.  FINDINGS: Frontal, oblique, and lateral views were obtained. There is soft tissue swelling along the dorsal distal aspect of the  first digit. There are scattered foci of air in the soft tissues, concerning for potential soft tissue infection in this area. There is no erosive change or bony destruction. No fracture or dislocation. Joint spaces appear intact. There is extensive arterial vascular calcification.  IMPRESSION: Soft tissue air is noted in the distal first digit. This finding is concerning for potential infection. Early gas gangrene could present in this manner and must be of concern. No bony destruction. No fracture or dislocation. Extensive arterial vascular calcification. Suspect diabetes mellitus.  Electronically Signed: By: Lowella Grip M.D. On: 09/10/2014 10:39    Velvet Bathe, MD  [facility] Pager:[phone]  If 7PM-7AM, please contact night-coverage [email or web address] Password [email or web address] 09/15/2014, 11:44 AM   LOS: 7 days

## 2014-09-15 NOTE — Progress Notes (Signed)
Patient arrived to unit 2 central bed  12 from unit 6 east.Patient alert and oriented x 4. Denies pain but complains of shortness of breath.Oxygen at 2 lnc placed on patient.Patient placed on telemetry and oriented to unit. NP Alene Mires  In to see patient.

## 2014-09-15 NOTE — Progress Notes (Signed)
Lab called - specimen that was collected for H&H was hemolyzed. Will send someone to redraw

## 2014-09-15 NOTE — Progress Notes (Addendum)
Deatra Ina MD of [facility] called regarding patient's frequent bloody stools. Per Deatra Ina MD, transfuse 2 units of packed red blood cells and transfer to step down for closer monitoring. Orders pending to be implemented.  Brooke Therapist, sports from rapid response assessed patient. Alene Mires MD notified, report given to [facility]. Pt recent vitals are BP 64/42 with complaints of feeling dizziness and being lightheaded. Patient to be transferred to [facility]. Velora Mediate

## 2014-09-15 NOTE — Progress Notes (Signed)
[facility] ROUNDING NOTE   Subjective:   Interval History: recurrent bowel movements   Objective:  Vital signs in last 24 hours:  Temp:  [96.8 F (36 C)-99.4 F (37.4 C)] 97.8 F (36.6 C) (12/25 0945) Pulse Rate:  [73-79] 75 (12/25 0615) Resp:  [12-18] 14 (12/25 0705) BP: (64-138)/(19-72) 103/40 mmHg (12/25 0945) SpO2:  [97 %-100 %] 98 % (12/25 0945) Weight:  [44.3 kg (97 lb 10.6 oz)-46.04 kg (101 lb 8 oz)] 44.3 kg (97 lb 10.6 oz) (12/25 0324)  Weight change: -1.76 kg (-3 lb 14.1 oz) Filed Weights   09/13/14 2100 09/14/14 2141 09/15/14 0324  Weight: 46 kg (101 lb 6.6 oz) 46.04 kg (101 lb 8 oz) 44.3 kg (97 lb 10.6 oz)    Intake/Output: I/O last 3 completed shifts: In: 240 [P.O.:240] Out: 5 [Stool:5]   Intake/Output this shift:  Total I/O In: 552 [P.O.:150; I.V.:50; Blood:352] Out: -   General: uncomfortable, emaciated Heart: RRR Lungs: no rales Abdomen: soft NT Extremities: left foot wrapped; right foot/toe gangrene stable - no overt edema Dialysis Access: left thigh AVGG + bruit   Basic Metabolic Panel:  Recent Labs Lab 09/09/14 1649 09/11/14 0919 09/11/14 1445 09/12/14 0520 09/13/14 0756  NA 141 138 138 140  140 135  K 3.9 4.8 4.5 4.4  4.2 3.8  CL 98 98 99 103  102 99  CO2 29 27 26 25  28 28   GLUCOSE 65* 92 76 85  90 104*  BUN 31* 18 19 6  6 10   CREATININE 7.03* 5.50* 5.53* 3.74*  3.75* 5.82*  CALCIUM 7.7* 7.8* 7.5* 7.8*  7.8* 7.3*  PHOS 4.9*  --  4.4 3.3 4.1    Liver Function Tests:  Recent Labs Lab 09/08/14 2121 09/09/14 0426 09/09/14 1649 09/11/14 1445 09/12/14 0520 09/13/14 0756  AST 36 25  --   --   --   --   ALT 15 11  --   --   --   --   ALKPHOS 166* 134*  --   --   --   --   BILITOT 1.1 0.8  --   --   --   --   PROT 6.3 5.4*  --   --   --   --   ALBUMIN 2.2* 1.8* 1.8* 1.7* 1.7* 1.7*   No results for input(s): LIPASE, AMYLASE in the last 168 hours. No results for input(s): AMMONIA in the last 168  hours.  CBC:  Recent Labs Lab 09/08/14 2121  09/11/14 0919 09/11/14 1445 09/12/14 0520 09/13/14 0755 09/14/14 2219 09/15/14 0302  WBC 10.2  < > 10.9* 8.7 8.1 9.2  --  12.6*  NEUTROABS 7.0  --   --   --   --   --   --   --   HGB 12.3*  < > 9.5* 8.9* 9.2* 8.9* 8.1* 7.3*  HCT 40.0  < > 30.5* 28.8* 29.1* 28.5* 26.3* 23.7*  MCV 91.7  < > 90.5 88.3 90.1 90.2  --  86.8  PLT 206  < > 160 170 140* 131*  --  147*  < > = values in this interval not displayed.  Cardiac Enzymes: No results for input(s): CKTOTAL, CKMB, CKMBINDEX, TROPONINI in the last 168 hours.  BNP: Invalid input(s): POCBNP  CBG: No results for input(s): GLUCAP in the last 168 hours.  Microbiology: Results for orders placed or performed during the hospital encounter of 09/08/14  Culture, blood (routine x 2)  Status: None (Preliminary result)   Collection Time: 09/10/14 12:20 PM  Result Value Ref Range Status   Specimen Description BLOOD RIGHT WRIST  Final   Special Requests BOTTLES DRAWN AEROBIC ONLY 8CC  Final   Culture  Setup Time   Final    09/10/2014 18:32 Performed at [facility]    Culture   Final           BLOOD CULTURE RECEIVED NO GROWTH TO DATE CULTURE WILL BE HELD FOR 5 DAYS BEFORE ISSUING A FINAL NEGATIVE REPORT Performed at [facility]    Report Status PENDING  Incomplete  Culture, blood (routine x 2)     Status: None (Preliminary result)   Collection Time: 09/10/14 12:30 PM  Result Value Ref Range Status   Specimen Description BLOOD LEFT WRIST  Final   Special Requests BOTTLES DRAWN AEROBIC ONLY 10CC  Final   Culture  Setup Time   Final    09/10/2014 18:32 Performed at [facility]    Culture   Final           BLOOD CULTURE RECEIVED NO GROWTH TO DATE CULTURE WILL BE HELD FOR 5 DAYS BEFORE ISSUING A FINAL NEGATIVE REPORT Performed at [facility]    Report Status PENDING  Incomplete  Surgical pcr screen     Status: None   Collection Time: 09/12/14  2:36  PM  Result Value Ref Range Status   MRSA, PCR NEGATIVE NEGATIVE Final   Staphylococcus aureus NEGATIVE NEGATIVE Final    Comment:        The Xpert SA Assay (FDA approved for NASAL specimens in patients over 57 years of age), is one component of a comprehensive surveillance program.  Test performance has been validated by [employer] for patients greater than or equal to 32 year old. It is not intended to diagnose infection nor to guide or monitor treatment.     Coagulation Studies: No results for input(s): LABPROT, INR in the last 72 hours.  Urinalysis: No results for input(s): COLORURINE, LABSPEC, PHURINE, GLUCOSEU, HGBUR, BILIRUBINUR, KETONESUR, PROTEINUR, UROBILINOGEN, NITRITE, LEUKOCYTESUR in the last 72 hours.  Invalid input(s): APPERANCEUR    Imaging: No results found.   Medications:     . sodium chloride   Intravenous Once  . amLODipine  10 mg Oral Daily  . atorvastatin  40 mg Oral q1800  . bacitracin   Topical BID  . calcitRIOL  2.75 mcg Oral Q T,Th,Sa-HD  . calcium acetate  2,001 mg Oral TID WC  . carvedilol  6.25 mg Oral BID WC  . cephALEXin  500 mg Oral Daily  . cinacalcet  60 mg Oral Q breakfast  . colchicine  0.3 mg Oral Once per day on Mon Thu  . [START ON 09/23/2014] darbepoetin (ARANESP) injection - DIALYSIS  60 mcg Intravenous Q Sat-HD  . doxycycline  100 mg Oral Q12H  . feeding supplement (RESOURCE BREEZE)  1 Container Oral TID BM  . ferric gluconate (FERRLECIT/NULECIT) IV  125 mg Intravenous Q T,Th,Sa-HD  . pantoprazole  40 mg Oral BID  . predniSONE  5 mg Oral Daily  . sodium chloride  3 mL Intravenous Q12H   sodium chloride, sodium chloride, acetaminophen, acetaminophen, metoCLOPramide, ondansetron **OR** ondansetron (ZOFRAN) IV, oxyCODONE  Assessment/ Plan:  1. Recurrent GIB;  Hemoglobin appears to be dropping transferred to Unit ; Colonosocpy 12/21 showed mod diverticulosis thought to have been source of GIB per Dr. Deatra Ina 2. ESRD - TTS -  HD  Saturday - kept even - HD Monday on Holiday schedule - - no heparin K 3.8 on 4 K bath - next HD due Saturday (orders written) 3. Hypertension/volume - BP ok -keep > 120 on HD to avoid ischemia; on norvasc 10/carvedilol6.25 bid - parameters written for holding meds for BP < 120; net UF 1 L Monday with post weight of 46.5 - net UF Wed 1.7 with post weight of 46  4. Anemia - drooping as patient continues to bleed 5. Metabolic bone disease - controlled with current calcitiriol/ sensipar/binders dose, Corr Ca/P ok 6. Protein calorie malnutrition - Alb 1.8 added Resource supplements to CL,  7. Severe PVD s/o recent bilateral fem pops in Oct and Nov with nonhealing foot wounds 8. Hep C + 9. Chronic steroid use - for gout 10. Leukocytosis -  WBC all much improved ? If any antibiotics needed for d/c  11. Multiple wounds - ulcer tip of penis- local care, right foot toes/distal foot dry gangrene; left great toe amputation s/p osteo - Dr Rico Sheehan 12/22 on Vanc and Zosyn   LOS: 7 Devynn Hessler W @TODAY @12 :21 PM

## 2014-09-15 NOTE — Progress Notes (Signed)
Contacted per floor RN earlier this evening concerning pt with reoccurring lower GI bleeding,with soft BP otherwise asymptomatic. Advised RN to monitor closely and notify triad PA for orders. Per floor RN Triad PA ordered CBC and was updated on results, AM CBC ordered. Upon my follow up at midnight I was informed that pt has had 6 total stools this shift, 4 after the last cbc draw.  RN advised to notify GI MD Dr.Kaplan concerning pt reoccurring bleeding. After speaking with Dr.Kaplan orders received to transfuse patient and transfer to stepdown for closer monitoring. Pt transfered to 2 C room 12 at 0300 with stable VS, pending blood transfusion.

## 2014-09-15 NOTE — Progress Notes (Signed)
     Progress Note   Subjective  *Patient has had multiple bowel movements overnight.**   Objective  Vital signs in last 24 hours: Temp:  [96.8 F (36 C)-99.4 F (37.4 C)] 97.8 F (36.6 C) (12/25 0945) Pulse Rate:  [73-79] 75 (12/25 0615) Resp:  [12-18] 14 (12/25 0705) BP: (64-138)/(19-72) 103/40 mmHg (12/25 0945) SpO2:  [97 %-100 %] 98 % (12/25 0945) Weight:  [97 lb 10.6 oz (44.3 kg)-101 lb 8 oz (46.04 kg)] 97 lb 10.6 oz (44.3 kg) (12/25 0324) Last BM Date: 09/15/14 General:   Alert,  Well-developed,   in NAD Heart:  Regular rate and rhythm; no murmurs Abdomen:  Soft, nontender and nondistended. Normal bowel sounds, without guarding, and without rebound.   Extremities:  Without edema. Neurologic:  Alert and  oriented x4;  grossly normal neurologically. Psych:  Alert and cooperative. Normal mood and affect.  Intake/Output from previous day: 12/24 0701 - 12/25 0700 In: 240 [P.O.:240] Out: 5 [Stool:5] Intake/Output this shift: Total I/O In: 402 [I.V.:50; Blood:352] Out: -   Lab Results:  Recent Labs  09/13/14 0755 09/14/14 2219 09/15/14 0302  WBC 9.2  --  12.6*  HGB 8.9* 8.1* 7.3*  HCT 28.5* 26.3* 23.7*  PLT 131*  --  147*   BMET  Recent Labs  09/13/14 0756  NA 135  K 3.8  CL 99  CO2 28  GLUCOSE 104*  BUN 10  CREATININE 5.82*  CALCIUM 7.3*   LFT  Recent Labs  09/13/14 0756  ALBUMIN 1.7*   PT/INR No results for input(s): LABPROT, INR in the last 72 hours. Hepatitis Panel No results for input(s): HEPBSAG, HCVAB, HEPAIGM, HEPBIGM in the last 72 hours.  Studies/Results: No results found.    Assessment & Plan  *Recurrent lower GI bleeding-probably secondary to a diverticulum.  Recommend**stat CTA if recommended he is a continues.  Should CTA be positive would proceed with angiography. Principal Problem:   GI bleed Active Problems:   ESRD on hemodialysis   CAD (coronary artery disease), native coronary artery   Essential hypertension  Diverticulosis of colon with hemorrhage     LOS: 7 days   Erskine Emery  09/15/2014, 12:01 PM

## 2014-09-15 NOTE — Progress Notes (Signed)
Event Shift RN paged secondary to pt with  6 episodes of BRBPR. Hgb dropped from 8.9 to 8.1, pt was asymptomatic. RN paged GI on call and was given order to transfer pt to SDU and transfuse 2 U of PRBC.  Upon my arrival to floor, pt started to become hypotensive, symptomatic with lightheadedness/shortness of breath, and repeat H/H with 7.3 Hgb.  Update: with transfusion, BP is improving, pt is resting comfortably in bed, with 100% O2 sats on [place].    [facility] [phone]

## 2014-09-15 NOTE — Progress Notes (Signed)
Patient ID: [name], male   DOB: [birth year], 53 y.o.   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 Trula Slade yesterday  We'll continue to follow with you

## 2014-09-16 LAB — CULTURE, BLOOD (ROUTINE X 2)
CULTURE: NO GROWTH
Culture: NO GROWTH

## 2014-09-16 LAB — RENAL FUNCTION PANEL
Albumin: 1.4 g/dL — ABNORMAL LOW (ref 3.5–5.2)
Anion gap: 9 (ref 5–15)
BUN: 21 mg/dL (ref 6–23)
CO2: 25 mmol/L (ref 19–32)
Calcium: 6.9 mg/dL — ABNORMAL LOW (ref 8.4–10.5)
Chloride: 105 mEq/L (ref 96–112)
Creatinine, Ser: 7.52 mg/dL — ABNORMAL HIGH (ref 0.50–1.35)
GFR calc Af Amer: 8 mL/min — ABNORMAL LOW (ref >90)
GFR calc non Af Amer: 7 mL/min — ABNORMAL LOW (ref >90)
Glucose, Bld: 90 mg/dL (ref 70–99)
Phosphorus: 4.2 mg/dL (ref 2.3–4.6)
Potassium: 4.3 mmol/L (ref 3.5–5.1)
Sodium: 139 mmol/L (ref 135–145)

## 2014-09-16 LAB — CBC
HCT: 25.6 % — ABNORMAL LOW (ref 39.0–52.0)
Hemoglobin: 8.5 g/dL — ABNORMAL LOW (ref 13.0–17.0)
MCH: 28.2 pg (ref 26.0–34.0)
MCHC: 33.2 g/dL (ref 30.0–36.0)
MCV: 85 fL (ref 78.0–100.0)
Platelets: 137 10*3/uL — ABNORMAL LOW (ref 150–400)
RBC: 3.01 MIL/uL — ABNORMAL LOW (ref 4.22–5.81)
RDW: 15.4 % (ref 11.5–15.5)
WBC: 4.2 10*3/uL (ref 4.0–10.5)

## 2014-09-16 MED ORDER — ALTEPLASE 2 MG IJ SOLR
2.0000 mg | Freq: Once | INTRAMUSCULAR | Status: DC | PRN
  Filled 2014-09-16: qty 2

## 2014-09-16 MED ORDER — SODIUM CHLORIDE 0.9 % IV SOLN: 100.0000 mL | INTRAVENOUS | Status: DC | PRN

## 2014-09-16 MED ORDER — LIDOCAINE-PRILOCAINE 2.5-2.5 % EX CREA
1.0000 "application " | TOPICAL_CREAM | CUTANEOUS | Status: DC | PRN
  Filled 2014-09-16: qty 5

## 2014-09-16 MED ORDER — HEPARIN SODIUM (PORCINE) 1000 UNIT/ML DIALYSIS: 1000.0000 [IU] | INTRAMUSCULAR | Status: DC | PRN

## 2014-09-16 MED ORDER — LIDOCAINE HCL (PF) 1 % IJ SOLN: 5.0000 mL | INTRAMUSCULAR | Status: DC | PRN

## 2014-09-16 MED ORDER — PENTAFLUOROPROP-TETRAFLUOROETH EX AERO
1.0000 | INHALATION_SPRAY | CUTANEOUS | Status: DC | PRN
Start: 2014-09-16 — End: 2014-09-16

## 2014-09-16 MED ORDER — NEPRO/CARBSTEADY PO LIQD
237.0000 mL | ORAL | Status: DC | PRN
  Filled 2014-09-16: qty 237

## 2014-09-16 MED ORDER — DARBEPOETIN ALFA 60 MCG/0.3ML IJ SOSY
PREFILLED_SYRINGE | INTRAMUSCULAR | Status: AC
  Administered 2014-09-16: 60 ug via INTRAVENOUS
  Filled 2014-09-16: qty 0.3

## 2014-09-16 NOTE — Progress Notes (Signed)
PATIENT DETAILS Name: [name] Age: 53 y.o. Sex: male Date of Birth: 07-May-1961 Admit Date: 09/08/2014 Admitting Physician Berle Mull, MD UDJ:[id number],HYIFO L, MD  Brief narrative: According to last PN 53 year old male patient with known vascular disease, chronic kidney disease on dialysis, hypertension, chronic steroids for gout brought to the emergency room on 08/16/14 with severe right leg pain, evaluated by vascular surgery, and underwent right femoral to below the knee popliteal artery bypass graft on 11/27. Subsequently patient developed significant left leg swelling, with concerns for left venous stenosis related to his left thigh arteriovenous graft, on 12/3 patient underwent left thigh shuntogram with subsequent venoplasty and stenting of the left common femoral vein.Previous left foot swelling improved remarkably after this procedure. On 12/5 patient developed significant hematochezia, unfortunately developed hemorrhagic shock and required ICU transfer. He received a total of 11 units of PRBC, 4 units of FFP and temporarily required pressors to maintain blood pressure. Aspirin/Plavix were placed on hold. He slowly stabilized, hematochezia completely resolved, on 12/11 he started having brown bowel movements. GI planNED to do colonoscopy, however patient refused this bleeding had resolved.  Aspirin and Plavix were subsequently resumed, and patient was discharged home on 09/05/14. Unfortunately on 09/08/14, he had numerous recurrent hematochezia and presented back to the emergency room and was subsequently admitted. Patient has undergone a colonoscopy on 12/21 which is confirmed pandiverticulosis. No further bleeding since admission. Unfortunately , this hospital course has been complicated by left great toe infection plans are for a toe amputation on 12/22.  Subjective  No Major complaints.  No acute issues reported overngiht.  Assessment/Plan: Principal Problem:   Lower  GI bleed: Suspect this is secondary to diverticular disease. Prior GI bleeding in his previous admission was attributed it to probably ischemic colitis, he had refused colonoscopy that time. GI consulted, underwent colonoscopy on 12/22 which confirmed diverticulosis.  - Will discontinue plavix - Wll check hemoglobin levels every 3 hours. Transfuse as necessary. Last hemoglobin from 10.1-8.5. Should hemoglobin dipped below 8 we'll plan on transfusing  Active Problems:   Mild acute blood loss anemia: Secondary to above, although hemoglobin was 12.3 on admission. Hgb levels have trended down with blood loss - Will reassess cbc after transfusion   Infected Left Great YDX:AJOI great toe with purulent foul smelling drainage-suspect likely cultprit for leukocytosis on 12/20. Xray shows possible early gas gangrene, on empiric Vanco/Zosyn.Blood culture on 12/20 negative. VVS following, and patient is s/p amputation 12/22 .Leukocytosis has resolved with Abx. - Transitioned to oral antibiotics -PT evaluation      ESRD on hemodialysis: Nephrology consulted for dialysis. Regular schedule is TTS    History of CAD with prior drug-eluting stent April 2015:no acute cardiac issues, given recurrent hematochezia aspirin/Plavix held on admission. Colonoscopy demonstrated Diverticulosis. 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    History of hypertension: Currently controlled, cautiously continue with Coreg and amlodipine.    History of gout: Continue with chronic prednisone and colchicine     Penile lesion:Likely necroses area secondary to known vascular diease and  recent hemorrhagic shock. Associate discussed case with Dr Matilde Sprang Urology  on call on 12/21 who reviewed chart and picture (taken 12/20), and recommended daily wound care and to apply bacitracin.  See picture below.He thought the area will demarcate and heal slowly. Remains stable    Deconditioning/generalized weakness: Secondary to recent prolonged hospitalization/acute illness -PT evaluation.    Severe protein calorie malnutrition: Continue with supplements  Disposition: Remain inpatient-Home with home health services when ready  Antibiotics:  Cephalexin  Doxycycline   Anti-infectives    Start     Dose/Rate Route Frequency Ordered Stop   09/14/14 1200  doxycycline (VIBRA-TABS) tablet 100 mg     100 mg Oral Every 12 hours 09/14/14 1108     09/14/14 1200  cephALEXin (KEFLEX) capsule 500 mg     500 mg Oral Daily 09/14/14 1108     09/13/14 1500  vancomycin (VANCOCIN) 500 mg in sodium chloride 0.9 % 100 mL IVPB     500 mg100 mL/hr over 60 Minutes Intravenous  Once 09/13/14 1430 09/13/14 1720   09/12/14 1200  vancomycin (VANCOCIN) 500 mg in sodium chloride 0.9 % 100 mL IVPB  Status:  Discontinued     500 mg100 mL/hr over 60 Minutes Intravenous Every T-Th-Sa (Hemodialysis) 09/10/14 1035 09/11/14 1120   09/11/14 1200  vancomycin (VANCOCIN) 500 mg in sodium chloride 0.9 % 100 mL IVPB  Status:  Discontinued     500 mg100 mL/hr over 60 Minutes Intravenous Every Dialysis 09/11/14 1120 09/11/14 1122   09/11/14 1200  vancomycin (VANCOCIN) 500 mg in sodium chloride 0.9 % 100 mL IVPB     500 mg100 mL/hr over 60 Minutes Intravenous Every Mon (Hemodialysis) 09/11/14 1128 09/11/14 1755   09/11/14 1121  vancomycin (VANCOCIN) 500 mg in sodium chloride 0.9 % 100 mL IVPB  Status:  Discontinued     500 mg100 mL/hr over 60 Minutes Intravenous Every Dialysis 09/11/14 1122 09/11/14 1123   09/10/14 1400  clindamycin (CLEOCIN) IVPB 600 mg     600 mg100 mL/hr over 30 Minutes Intravenous 3 times per day  09/10/14 1047 09/11/14 2359   09/10/14 1200  vancomycin (VANCOCIN) IVPB 1000 mg/200 mL premix     1,000 mg200 mL/hr over 60 Minutes Intravenous  Once 09/10/14 1035 09/10/14 1215   09/10/14 1200  piperacillin-tazobactam (ZOSYN) IVPB 2.25 g  Status:  Discontinued     2.25 g100 mL/hr over 30 Minutes Intravenous Every 8 hours 09/10/14 1035 09/14/14 1108      DVT Prophylaxis: SCD's  Code Status: Full code   Family Communication None at bedside- patient is awake/alert and understanding of above plan.  Procedures:  None  CONSULTS:  nephrology   MEDICATIONS: Scheduled Meds: . amLODipine  10 mg Oral Daily  . atorvastatin  40 mg Oral q1800  . bacitracin   Topical BID  . calcitRIOL  2.75 mcg Oral Q T,Th,Sa-HD  . calcium acetate  2,001 mg Oral TID WC  . carvedilol  6.25 mg Oral BID WC  . cephALEXin  500 mg Oral Daily  . cinacalcet  60 mg Oral Q breakfast  . colchicine  0.3 mg Oral Once per day on Mon Thu  . [START ON 09/23/2014] darbepoetin (ARANESP) injection - DIALYSIS  60 mcg Intravenous Q Sat-HD  . doxycycline  100 mg Oral Q12H  . feeding supplement (RESOURCE BREEZE)  1 Container Oral TID BM  . ferric gluconate (FERRLECIT/NULECIT) IV  125 mg Intravenous Q T,Th,Sa-HD  . pantoprazole  40 mg Oral BID  . predniSONE  5 mg Oral Daily  . sodium chloride  3 mL Intravenous Q12H   Continuous Infusions:   PRN Meds:.sodium chloride, sodium chloride, sodium chloride, sodium chloride, acetaminophen, acetaminophen, alteplase, feeding supplement (NEPRO CARB STEADY), heparin, lidocaine (PF), lidocaine-prilocaine, metoCLOPramide, ondansetron **OR** ondansetron (ZOFRAN) IV, oxyCODONE, pentafluoroprop-tetrafluoroeth    PHYSICAL EXAM: Vital signs in last 24 hours: Filed Vitals:   09/16/14 1310 09/16/14 1330 09/16/14 1404 09/16/14 1430  BP: 144/68 119/62 119/60 128/60  Pulse: 70 75 72 78  Temp:      TempSrc:      Resp:      Height:      Weight:      SpO2:        Weight change: -0.34  kg (-12 oz) Filed Weights   09/15/14 0324 09/16/14 0500 09/16/14 1258  Weight: 44.3 kg (97 lb 10.6 oz) 45.7 kg (100 lb 12 oz) 46.7 kg (102 lb 15.3 oz)   Body mass index is 14.37 kg/(m^2).   Gen Exam: Awake and alert with clear speech.   Neck: Supple, No JVD.   Chest: B/L Clear.   CVS: No edema Abdomen: non tender, non distended.  Extremities: no edema, lower extremities warm to touch.Foul smelling ulcer in left foot with purulent drainage.Dry gangrene in several toes in right foot Neurologic: Non Focal.   Skin: No Rash.   Wounds: see below     Right Foot with dry gangrene (below)   Left great toe (below)   Intake/Output from previous day:  Intake/Output Summary (Last 24 hours) at 09/16/14 1434 Last data filed at 09/16/14 0950  Gross per 24 hour  Intake    240 ml  Output      1 ml  Net    239 ml     LAB RESULTS: CBC  Recent Labs Lab 09/11/14 1445 09/12/14 0520 09/13/14 0755 09/14/14 2219 09/15/14 0302 09/15/14 1445 09/16/14 1324  WBC 8.7 8.1 9.2  --  12.6*  --  4.2  HGB 8.9* 9.2* 8.9* 8.1* 7.3* 10.1* 8.5*  HCT 28.8* 29.1* 28.5* 26.3* 23.7* 30.6* 25.6*  PLT 170 140* 131*  --  147*  --  137*  MCV 88.3 90.1 90.2  --  86.8  --  85.0  MCH 27.3 28.5 28.2  --  26.7  --  28.2  MCHC 30.9 31.6 31.2  --  30.8  --  33.2  RDW 16.2* 16.2* 16.4*  --  16.4*  --  15.4    Chemistries   Recent Labs Lab 09/11/14 0919 09/11/14 1445 09/12/14 0520 09/13/14 0756 09/16/14 1324  NA 138 138 140  140 135 139  K 4.8 4.5 4.4  4.2 3.8 4.3  CL 98 99 103  102 99 105  CO2 27 26 25  28 28 25   GLUCOSE 92 76 85  90 104* 90  BUN 18 19 6  6 10 21   CREATININE 5.50* 5.53* 3.74*  3.75* 5.82* 7.52*  CALCIUM 7.8* 7.5* 7.8*  7.8* 7.3* 6.9*    CBG: No results for input(s): GLUCAP in the last 168 hours.  GFR Estimated Creatinine Clearance: 7.3 mL/min (by C-G formula based on Cr of 7.52).  Coagulation profile No results for input(s): INR, PROTIME in the last 168  hours.  Cardiac Enzymes No results for input(s): CKMB, TROPONINI, MYOGLOBIN in the last 168 hours.  Invalid input(s): CK  Invalid input(s): POCBNP No results for input(s): DDIMER in the last 72 hours. No results for input(s): HGBA1C in the last 72 hours. No results for input(s): CHOL, HDL, LDLCALC, TRIG, CHOLHDL, LDLDIRECT  in the last 72 hours. No results for input(s): TSH, T4TOTAL, T3FREE, THYROIDAB in the last 72 hours.  Invalid input(s): FREET3 No results for input(s): VITAMINB12, FOLATE, FERRITIN, TIBC, IRON, RETICCTPCT in the last 72 hours. No results for input(s): LIPASE, AMYLASE in the last 72 hours.  Urine Studies No results for input(s): UHGB, CRYS in the last 72 hours.  Invalid input(s): UACOL, UAPR, USPG, UPH, UTP, UGL, UKET, UBIL, UNIT, UROB, ULEU, UEPI, UWBC, URBC, UBAC, CAST, UCOM, BILUA  MICROBIOLOGY: Recent Results (from the past 240 hour(s))  Culture, blood (routine x 2)     Status: None   Collection Time: 09/10/14 12:20 PM  Result Value Ref Range Status   Specimen Description BLOOD RIGHT WRIST  Final   Special Requests BOTTLES DRAWN AEROBIC ONLY [place]  Final   Culture  Setup Time   Final    09/10/2014 18:32 Performed at [facility]    Culture   Final    NO GROWTH 5 DAYS Performed at [facility]    Report Status 09/16/2014 FINAL  Final  Culture, blood (routine x 2)     Status: None   Collection Time: 09/10/14 12:30 PM  Result Value Ref Range Status   Specimen Description BLOOD LEFT WRIST  Final   Special Requests BOTTLES DRAWN AEROBIC ONLY 10CC  Final   Culture  Setup Time   Final    09/10/2014 18:32 Performed at [facility]    Culture   Final    NO GROWTH 5 DAYS Performed at [facility]    Report Status 09/16/2014 FINAL  Final  Surgical pcr screen     Status: None   Collection Time: 09/12/14  2:36 PM  Result Value Ref Range Status   MRSA, PCR NEGATIVE NEGATIVE Final   Staphylococcus aureus NEGATIVE NEGATIVE  Final    Comment:        The Xpert SA Assay (FDA approved for NASAL specimens in patients over 57 years of age), is one component of a comprehensive surveillance program.  Test performance has been validated by [employer] for patients greater than or equal to 66 year old. It is not intended to diagnose infection nor to guide or monitor treatment.     RADIOLOGY STUDIES/RESULTS: Dg Chest 2 View  09/10/2014   CLINICAL DATA:  Leukocytosis  EXAM: CHEST  2 VIEW  COMPARISON:  08/29/2014  FINDINGS: Bilateral mild apical pleural calcifications. Left apical calcifications which are new compared with prior chest x-ray dated 07/01/2012. There is a small-moderate left pleural effusion. There is no right pleural effusion. There is no pneumothorax. The cardiomediastinal silhouette is stable. No acute osseous abnormality.  IMPRESSION: 1. Small either moderate left pleural effusion. Left apical calcifications again noted which are new since the prior exam of 07/01/2012. These are likely postinflammatory but can be seen in the setting of tuberculosis. Correlate with PPD or QuantiFERON test.   Electronically Signed   By: Kathreen Devoid   On: 09/10/2014 10:35   Ct Abdomen Pelvis W Contrast  08/27/2014   CLINICAL DATA:  Acute onset of bright red blood per rectum, with periumbilical cramping abdominal pain. Initial encounter.  EXAM: CT ABDOMEN AND PELVIS WITH CONTRAST  TECHNIQUE: Multidetector CT imaging of the abdomen and pelvis was performed using the standard protocol following bolus administration of intravenous contrast.  CONTRAST:  197mL OMNIPAQUE IOHEXOL 300 MG/ML  SOLN  COMPARISON:  CT of the abdomen and pelvis from 11/10/2012  FINDINGS: Trace bilateral pleural effusions are  seen, with bibasilar atelectasis.  A nonspecific 6 mm hypodensity within the left hepatic lobe is of uncertain significance. The liver is otherwise unremarkable. The spleen is mildly bulky, but remains normal in length. There is  persistent chronic gallbladder wall thickening. The gallbladder is difficult to fully assess given trace ascites tracking about the liver.  The pancreas and adrenal glands are unremarkable.  The patient's native kidneys are markedly atrophic, with scattered bilateral renal cysts of varying size and attenuation. Scattered associated calcifications are seen. There is no evidence of hydronephrosis. No obstructing renal stones are seen.  There is marked fatty infiltration with regard to the transplant kidney at the left iliac fossa, with marked thinning of the renal parenchyma. Diffuse associated vascular calcifications are seen. This appearance is stable from 2014.  There is mild diffuse mesenteric and omental edema, nonspecific in appearance.  The stomach is within normal limits. No acute vascular abnormalities are seen. Diffuse calcification is seen along the abdominal aorta and its branches. Extensive diffuse vascular calcification is noted throughout the abdomen and pelvis.  The patient appears to be status post resection of much of the bowel. Remaining small bowel is grossly unremarkable. A distended anastomosis is noted at the right hemipelvis; this corresponds to the site of the prior right pelvic catheter, which has been removed.  The appendix is not definitely characterized; there is no evidence for appendicitis. Contrast progresses to the level of the proximal sigmoid colon. There is scattered diverticulosis along the transverse colon. Stool is noted filling the distal sigmoid colon and rectum.  There is soft tissue inflammation about the distal sigmoid colon and rectum, raising question for proctitis. Diffuse presacral stranding is also noted, new from the prior study.  The bladder is not well assessed. Soft tissue density about the expected location of the bladder is relatively stable in appearance. 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 This is perhaps slightly more prominent than on the prior study. No inguinal lymphadenopathy is seen.  No acute osseous abnormalities are identified.  IMPRESSION: 1. Soft tissue inflammation about the distal sigmoid colon and rectum, with diffuse presacral stranding, new from the prior study. This raises concern for proctitis, which may explain the patient's bright red blood per rectum. Stool noted filling the distal sigmoid colon and rectum. 2. Scattered diverticulosis along the transverse colon, without evidence of diverticulitis; the colon is otherwise grossly unremarkable. 3. Trace bilateral pleural effusions, with bibasilar atelectasis. 4. Marked atrophy of the patient's bilateral native kidneys, with bilateral renal cysts and scattered calcifications. Marked fatty infiltration of the transplant kidney at the left iliac fossa, with marked thinning of the renal parenchyma. This appearance is stable from 2014. 5. Stable appearance to soft tissue density about the expected location of the bladder. 6. 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 7. Diffuse calcification along the abdominal aorta and its branches. Extensive diffuse vascular calcification seen throughout the abdomen and pelvis. 8. Nonspecific 6 mm hypodensity within the left hepatic lobe, new from prior studies. 9. Persistent chronic gallbladder wall thickening; gallbladder difficult to fully assess given trace ascites about the liver, but this appearance is relatively stable. 10. Mild nonspecific diffuse mesenteric and omental edema.   Electronically Signed   By: Garald Balding M.D.   On: 08/27/2014 01:30   Dg Chest Port 1  View  08/29/2014   CLINICAL DATA:  Central line placement.  EXAM: PORTABLE CHEST - 1 VIEW  COMPARISON:  07/03/2012  FINDINGS: Interval placement of a left central venous  catheter. Tip projects to the left mediastinum over the aortic arch. This could be in the brachiocephalic vein, in a normal variant left superior vena cava, or an arterial branch. No pneumothorax. Calcification in the left apex is likely postinflammatory. This is new since previous study. Heart size is normal. Hemidiaphragms are not included within the field of view.  IMPRESSION: Nonspecific placement of left central venous catheter to the left of midline, suggesting placement either in the brachiocephalic vein, left superior vena cava, or arterial branch. No pneumothorax.   Electronically Signed   By: Lucienne Capers M.D.   On: 08/29/2014 02:17   Dg Abd Portable 1v  08/26/2014   CLINICAL DATA:  Acute GI bleed  EXAM: PORTABLE ABDOMEN - 1 VIEW  COMPARISON:  CT abdomen pelvis dated 11/10/2012  FINDINGS: Nonspecific but nonobstructive bowel gas pattern.  Surgical clips overlying the pelvis.  Vascular calcifications.  IMPRESSION: Unremarkable abdominal radiograph.   Electronically Signed   By: Julian Hy M.D.   On: 08/26/2014 22:30   Dg Toe Great Left  09/10/2014   ADDENDUM REPORT: 09/10/2014 10:44  ADDENDUM: These results were called by telephone at the time of interpretation on 09/10/2014 at 10:44 am to Dr. Oren Binet , who verbally acknowledged these results.   Electronically Signed   By: Lowella Grip M.D.   On: 09/10/2014 10:44   09/10/2014   CLINICAL DATA:  Loss of blood flow to first toe ; pain  EXAM: LEFT FIRST TOE  COMPARISON:  None.  FINDINGS: Frontal, oblique, and lateral views were obtained. There is soft tissue swelling along the dorsal distal aspect of the first digit. There are scattered foci of air in the soft tissues, concerning for potential soft tissue infection in this area. There is no erosive change or bony destruction. No fracture or dislocation. Joint spaces appear intact. There is extensive arterial vascular calcification.  IMPRESSION: Soft tissue air is noted in the  distal first digit. This finding is concerning for potential infection. Early gas gangrene could present in this manner and must be of concern. No bony destruction. No fracture or dislocation. Extensive arterial vascular calcification. Suspect diabetes mellitus.  Electronically Signed: By: Lowella Grip M.D. On: 09/10/2014 10:39    Velvet Bathe, MD  [facility] Pager:[phone]  If 7PM-7AM, please contact night-coverage [email or web address] Password [email or web address] 09/16/2014, 2:34 PM   LOS: 8 days

## 2014-09-16 NOTE — Progress Notes (Signed)
Patient ID: [name], male   DOB: [birth year], 53 y.o.   MRN: [id number] Vascular Surgery Progress Note  Subjective: Post left first toe amputation per Dr. Trula Slade  Objective:  Filed Vitals:   09/16/14 0725  BP: 116/53  Pulse: 69  Temp: 98.1 F (36.7 C)  Resp: 18    Left first toe amputation site examined. Some slight darkening of skin but no evidence of infection and healing satisfactorily thus far Dry gangrene contralateral right first second and third toes with no evidence of cellulitis or drainage.   Labs:  Recent Labs Lab 09/11/14 1445 09/12/14 0520 09/13/14 0756  CREATININE 5.53* 3.74*  3.75* 5.82*    Recent Labs Lab 09/11/14 1445 09/12/14 0520 09/13/14 0756  NA 138 140  140 135  K 4.5 4.4  4.2 3.8  CL 99 103  102 99  CO2 26 25  28 28   BUN 19 6  6 10   CREATININE 5.53* 3.74*  3.75* 5.82*  GLUCOSE 76 85  90 104*  CALCIUM 7.5* 7.8*  7.8* 7.3*    Recent Labs Lab 09/12/14 0520 09/13/14 0755 09/14/14 2219 09/15/14 0302 09/15/14 1445  WBC 8.1 9.2  --  12.6*  --   HGB 9.2* 8.9* 8.1* 7.3* 10.1*  HCT 29.1* 28.5* 26.3* 23.7* 30.6*  PLT 140* 131*  --  147*  --    No results for input(s): INR in the last 168 hours.  I/O last 3 completed shifts: In: 975 [P.O.:250; I.V.:75; Blood:650] Out: 6 [Stool:6]  Imaging: No results found.  Assessment/Plan:  POD #8  LOS: 8 days  s/p Procedure(s): AMPUTATION DIGIT-LEFT GREAT TOE  We'll continue to follow with you. Patient stable following GI bleed 24 hours ago hemoglobin 10 g yesterday.   Tinnie Gens, MD 09/16/2014 8:51 AM

## 2014-09-16 NOTE — Progress Notes (Signed)
     [facility] Progress Note  Subjective:  Had BM overnight with no bleeding reported per nursing staff.  Objective:  Vital signs in last 24 hours: Temp:  [97.5 F (36.4 C)-98.1 F (36.7 C)] 98.1 F (36.7 C) (12/26 0725) Pulse Rate:  [62-71] 69 (12/26 0725) Resp:  [12-25] 18 (12/26 0725) BP: (105-123)/(32-74) 116/53 mmHg (12/26 0725) SpO2:  [98 %-100 %] 98 % (12/26 0725) Weight:  [100 lb 12 oz (45.7 kg)] 100 lb 12 oz (45.7 kg) (12/26 0500) Last BM Date: 09/15/14 General:  Alert, very thin, in NAD Heart:  Regular rate and rhythm; no murmurs Pulm:  CTAB.  No W/R/R. Abdomen:  Soft, non-distended. Normal bowel sounds.  Non-tender. Extremities:  Without edema. Neurologic:  Alert and  oriented x4;  grossly normal neurologically. Psych:  Alert and cooperative. Normal mood and affect.  Intake/Output from previous day: 12/25 0701 - 12/26 0700 In: 975 [P.O.:250; I.V.:75; Blood:650] Out: 1 [Stool:1]  Lab Results:  Recent Labs  09/14/14 2219 09/15/14 0302 09/15/14 1445  WBC  --  12.6*  --   HGB 8.1* 7.3* 10.1*  HCT 26.3* 23.7* 30.6*  PLT  --  147*  --    Assessment / Plan: -Lower GI bleeding:  Presumed diverticular.  No further signs of bleeding reported with BM overnight.  No repeat labs performed today.    *Monitor Hgb.  Signing off from GI standpoint.    LOS: 8 days   ZEHR, JESSICA D.  09/16/2014, 10:56 AM  Pager number [phone]  GI Attending Note  I have personally taken an interval history, reviewed the chart, and examined the patient.  I agree with the extender's note, impression and recommendations.  Sandy Salaam. Deatra Ina, MD, [facility] [phone]

## 2014-09-16 NOTE — Procedures (Signed)
Patient seen on Hemodialysis. QB 400, UF goal 2.5L Treatment adjusted as needed.  Elmarie Shiley MD [facility] Office # [phone] Pager # [phone] 1:11 PM

## 2014-09-16 NOTE — Progress Notes (Signed)
Patient ID: [name], male   DOB: 20-Dec-1960, 53 y.o.   MRN: [id number]  [facility] Progress Note   Assessment/ Plan:   1. Recurrent GIB with ABLA: Colonoscopy 12/21 showed diverticulosis (thought to have been source of GIB) per Dr. Deatra Ina. No rectal bleeding noted overnight 2. ESRD - TTS - HD today - kept even - HD Monday on Holiday schedule - - no heparin K 3.8 on 4 K bath 3. Hypertension/volume - BP well controlled- challenging EDW carefully 4. Anemia - Improved s/p PRBCs, will continue to monitor 5. Metabolic bone disease - controlled with current calcitiriol/ sensipar/binders dose, Corr Ca/P ok 6. Protein calorie malnutrition - Alb 1.8 added Resource supplements to current diet  7. Severe PVD s/o recent bilateral fem pops in Oct and Nov with nonhealing foot wounds 8. Chronic Hep C + state- likely affecting albumin levels 9. Chronic corticosteroid use - for gout 10. Multiple wounds - ulcer tip of penis- local care, right foot toes/distal foot dry gangrene; left great toe amputation s/p osteo - Dr Trula Slade 12/22 on Vanc and Zosyn   Subjective:   Reports to be having some LLE pain- frustrated   Objective:   BP 116/53 mmHg  Pulse 69  Temp(Src) 98.1 F (36.7 C) (Oral)  Resp 18  Ht 5\' 11"  (1.803 m)  Wt 45.7 kg (100 lb 12 oz)  BMI 14.06 kg/m2  SpO2 98%  Physical Exam: YQM:VHQIONGEXBM resting in bed WUX:LKGMW RRR, normal S1 and S2 Resp:CTA bilaterally, no rales/rhonchi NUU:VOZD, scaphoid, BS normal Ext:No LE edema- ischemic/gangrenous RLE toes/penile tip. Left leg in dressing  Labs: BMET  Recent Labs Lab 09/09/14 1649 09/11/14 0919 09/11/14 1445 09/12/14 0520 09/13/14 0756  NA 141 138 138 140  140 135  K 3.9 4.8 4.5 4.4  4.2 3.8  CL 98 98 99 103  102 99  CO2 29 27 26 25  28 28   GLUCOSE 65* 92 76 85  90 104*  BUN 31* 18 19 6  6 10   CREATININE 7.03* 5.50* 5.53* 3.74*  3.75* 5.82*  CALCIUM 7.7* 7.8* 7.5* 7.8*  7.8* 7.3*  PHOS 4.9*  --   4.4 3.3 4.1   CBC  Recent Labs Lab 09/11/14 1445 09/12/14 0520 09/13/14 0755 09/14/14 2219 09/15/14 0302 09/15/14 1445  WBC 8.7 8.1 9.2  --  12.6*  --   HGB 8.9* 9.2* 8.9* 8.1* 7.3* 10.1*  HCT 28.8* 29.1* 28.5* 26.3* 23.7* 30.6*  MCV 88.3 90.1 90.2  --  86.8  --   PLT 170 140* 131*  --  147*  --    Medications:    . amLODipine  10 mg Oral Daily  . atorvastatin  40 mg Oral q1800  . bacitracin   Topical BID  . calcitRIOL  2.75 mcg Oral Q T,Th,Sa-HD  . calcium acetate  2,001 mg Oral TID WC  . carvedilol  6.25 mg Oral BID WC  . cephALEXin  500 mg Oral Daily  . cinacalcet  60 mg Oral Q breakfast  . colchicine  0.3 mg Oral Once per day on Mon Thu  . [START ON 09/23/2014] darbepoetin (ARANESP) injection - DIALYSIS  60 mcg Intravenous Q Sat-HD  . doxycycline  100 mg Oral Q12H  . feeding supplement (RESOURCE BREEZE)  1 Container Oral TID BM  . ferric gluconate (FERRLECIT/NULECIT) IV  125 mg Intravenous Q T,Th,Sa-HD  . pantoprazole  40 mg Oral BID  . predniSONE  5 mg Oral Daily  . sodium chloride  3 mL Intravenous Q12H   Elmarie Shiley, MD 09/16/2014, 8:31 AM

## 2014-09-17 LAB — CBC
HCT: 30 % — ABNORMAL LOW (ref 39.0–52.0)
HEMOGLOBIN: 9.7 g/dL — AB (ref 13.0–17.0)
MCH: 27.8 pg (ref 26.0–34.0)
MCHC: 32.3 g/dL (ref 30.0–36.0)
MCV: 86 fL (ref 78.0–100.0)
PLATELETS: 144 10*3/uL — AB (ref 150–400)
RBC: 3.49 MIL/uL — ABNORMAL LOW (ref 4.22–5.81)
RDW: 15.6 % — ABNORMAL HIGH (ref 11.5–15.5)
WBC: 9.2 10*3/uL (ref 4.0–10.5)

## 2014-09-17 NOTE — Progress Notes (Signed)
PATIENT DETAILS Name: [name] Age: 53 y.o. Sex: male Date of Birth: February 24, 1961 Admit Date: 09/08/2014 Admitting Physician Berle Mull, MD EXH:BZJIRCVEL,FYBOF L, MD  Brief narrative: According to last PN 53 year old male patient with known vascular disease, chronic kidney disease on dialysis, hypertension, chronic steroids for gout brought to the emergency room on 08/16/14 with severe right leg pain, evaluated by vascular surgery, and underwent right femoral to below the knee popliteal artery bypass graft on 11/27. Subsequently patient developed significant left leg swelling, with concerns for left venous stenosis related to his left thigh arteriovenous graft, on 12/3 patient underwent left thigh shuntogram with subsequent venoplasty and stenting of the left common femoral vein.Previous left foot swelling improved remarkably after this procedure. On 12/5 patient developed significant hematochezia, unfortunately developed hemorrhagic shock and required ICU transfer. He received a total of 11 units of PRBC, 4 units of FFP and temporarily required pressors to maintain blood pressure. Aspirin/Plavix were placed on hold. He slowly stabilized, hematochezia completely resolved, on 12/11 he started having brown bowel movements. GI planNED to do colonoscopy, however patient refused this bleeding had resolved.  Aspirin and Plavix were subsequently resumed, and patient was discharged home on 09/05/14. Unfortunately on 09/08/14, he had numerous recurrent hematochezia and presented back to the emergency room and was subsequently admitted. Patient has undergone a colonoscopy on 12/21 which is confirmed pandiverticulosis. No further bleeding since admission. Unfortunately , this hospital course has been complicated by left great toe infection plans are for a toe amputation on 12/22.  Subjective  Pt does not report any more episodes of bleeding  Assessment/Plan: Principal Problem:   Lower GI bleed:  Suspect this is secondary to diverticular disease. Prior GI bleeding in his previous admission was attributed it to probably ischemic colitis, he had refused colonoscopy that time. GI consulted, underwent colonoscopy on 12/22 which confirmed diverticulosis.  - Will discontinue plavix - No more episodes of GI bleed reported. Last hemoglobin reported 9.7   Active Problems:   Mild acute blood loss anemia: Secondary to above, although hemoglobin was 12.3 on admission. Hgb levels have trended down with blood loss - Will reassess cbc after transfusion   Infected Left Great BPZ:WCHE great toe with purulent foul smelling drainage-suspect likely cultprit for leukocytosis on 12/20. Xray shows possible early gas gangrene, on empiric Vanco/Zosyn.Blood culture on 12/20 negative. VVS following, and patient is s/p amputation 12/22 .Leukocytosis has resolved with Abx. - Continue oral antibiotics -PT evaluation      ESRD on hemodialysis: Nephrology consulted for dialysis. Regular schedule is TTS    History of CAD with prior drug-eluting stent April 2015:no acute cardiac issues, given recurrent hematochezia aspirin/Plavix held on admission. Colonoscopy demonstrated Diverticulosis. 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    History of hypertension: Currently controlled, cautiously continue with Coreg and amlodipine.    History of gout: Continue with chronic prednisone and colchicine     Penile lesion:Likely necroses area secondary to known vascular diease and recent hemorrhagic shock. Associate discussed case with Dr Matilde Sprang Urology on call on 12/21 who reviewed  chart and picture (taken 12/20), and  recommended daily wound care and to apply bacitracin.  See picture below.He thought the area will demarcate and heal slowly. Remains stable    Deconditioning/generalized weakness: Secondary to recent prolonged hospitalization/acute illness -PT evaluation, patient reports he wants to go home once ready    Severe protein calorie malnutrition: Continue with supplements  Disposition: Remain inpatient-Home with home health services when ready  Antibiotics:  Cephalexin  Doxycycline   Anti-infectives    Start     Dose/Rate Route Frequency Ordered Stop   09/14/14 1200  doxycycline (VIBRA-TABS) tablet 100 mg     100 mg Oral Every 12 hours 09/14/14 1108     09/14/14 1200  cephALEXin (KEFLEX) capsule 500 mg     500 mg Oral Daily 09/14/14 1108     09/13/14 1500  vancomycin (VANCOCIN) 500 mg in sodium chloride 0.9 % 100 mL IVPB     500 mg100 mL/hr over 60 Minutes Intravenous  Once 09/13/14 1430 09/13/14 1720   09/12/14 1200  vancomycin (VANCOCIN) 500 mg in sodium chloride 0.9 % 100 mL IVPB  Status:  Discontinued     500 mg100 mL/hr over 60 Minutes Intravenous Every T-Th-Sa (Hemodialysis) 09/10/14 1035 09/11/14 1120   09/11/14 1200  vancomycin (VANCOCIN) 500 mg in sodium chloride 0.9 % 100 mL IVPB  Status:  Discontinued     500 mg100 mL/hr over 60 Minutes Intravenous Every Dialysis 09/11/14 1120 09/11/14 1122   09/11/14 1200  vancomycin (VANCOCIN) 500 mg in sodium chloride 0.9 % 100 mL IVPB     500 mg100 mL/hr over 60 Minutes Intravenous Every Mon (Hemodialysis) 09/11/14 1128 09/11/14 1755   09/11/14 1121  vancomycin (VANCOCIN) 500 mg in sodium chloride 0.9 % 100 mL IVPB  Status:  Discontinued     500 mg100 mL/hr over 60 Minutes Intravenous Every Dialysis 09/11/14 1122 09/11/14 1123   09/10/14 1400  clindamycin (CLEOCIN) IVPB 600 mg     600 mg100 mL/hr over 30 Minutes Intravenous 3 times per day 09/10/14 1047 09/11/14 2359   09/10/14 1200  vancomycin  (VANCOCIN) IVPB 1000 mg/200 mL premix     1,000 mg200 mL/hr over 60 Minutes Intravenous  Once 09/10/14 1035 09/10/14 1215   09/10/14 1200  piperacillin-tazobactam (ZOSYN) IVPB 2.25 g  Status:  Discontinued     2.25 g100 mL/hr over 30 Minutes Intravenous Every 8 hours 09/10/14 1035 09/14/14 1108      DVT Prophylaxis: SCD's  Code Status: Full code   Family Communication None at bedside- patient is awake/alert and understanding of above plan.  Procedures:  None  CONSULTS:  nephrology   MEDICATIONS: Scheduled Meds: . amLODipine  10 mg Oral Daily  . atorvastatin  40 mg Oral q1800  . bacitracin   Topical BID  . calcitRIOL  2.75 mcg Oral Q T,Th,Sa-HD  . calcium acetate  2,001 mg Oral TID WC  . carvedilol  6.25 mg Oral BID WC  . cephALEXin  500 mg Oral Daily  . cinacalcet  60 mg Oral Q breakfast  . colchicine  0.3 mg Oral Once per day on Mon Thu  . [START ON 09/23/2014] darbepoetin (ARANESP) injection - DIALYSIS  60 mcg Intravenous Q Sat-HD  . doxycycline  100 mg Oral Q12H  . feeding supplement (RESOURCE BREEZE)  1 Container Oral TID BM  . ferric gluconate (FERRLECIT/NULECIT) IV  125 mg Intravenous Q T,Th,Sa-HD  . pantoprazole  40 mg Oral BID  . predniSONE  5 mg Oral Daily  . sodium chloride  3 mL Intravenous  Q12H   Continuous Infusions:   PRN Meds:.acetaminophen, acetaminophen, metoCLOPramide, ondansetron **OR** ondansetron (ZOFRAN) IV, oxyCODONE    PHYSICAL EXAM: Vital signs in last 24 hours: Filed Vitals:   09/17/14 0400 09/17/14 0448 09/17/14 0600 09/17/14 0744  BP: 104/40 119/35 113/48 95/61  Pulse: 76 77 74 80  Temp:  98.4 F (36.9 C)  98.9 F (37.2 C)  TempSrc:  Oral  Oral  Resp: 15 18 15 15   Height:      Weight:   46.1 kg (101 lb 10.1 oz)   SpO2: 100% 98% 100% 100%    Weight change: 1 kg (2 lb 3.3 oz) Filed Weights   09/16/14 1258 09/16/14 1640 09/17/14 0600  Weight: 46.7 kg (102 lb 15.3 oz) 45.9 kg (101 lb 3.1 oz) 46.1 kg (101 lb 10.1 oz)   Body  mass index is 14.18 kg/(m^2).   Gen Exam: Awake and alert with clear speech.   Neck: Supple, No JVD.   Chest: B/L Clear.   CVS: No edema Abdomen: non tender, non distended.  Extremities: no edema, lower extremities warm to touch.Foul smelling ulcer in left foot with purulent drainage.Dry gangrene in several toes in right foot Neurologic: Non Focal.   Skin: No Rash.   Wounds: see below     Right Foot with dry gangrene (below)   Left great toe (below)   Intake/Output from previous day:  Intake/Output Summary (Last 24 hours) at 09/17/14 1155 Last data filed at 09/17/14 1100  Gross per 24 hour  Intake    710 ml  Output    784 ml  Net    -74 ml     LAB RESULTS: CBC  Recent Labs Lab 09/12/14 0520 09/13/14 0755 09/14/14 2219 09/15/14 0302 09/15/14 1445 09/16/14 1324 09/17/14 1044  WBC 8.1 9.2  --  12.6*  --  4.2 9.2  HGB 9.2* 8.9* 8.1* 7.3* 10.1* 8.5* 9.7*  HCT 29.1* 28.5* 26.3* 23.7* 30.6* 25.6* 30.0*  PLT 140* 131*  --  147*  --  137* 144*  MCV 90.1 90.2  --  86.8  --  85.0 86.0  MCH 28.5 28.2  --  26.7  --  28.2 27.8  MCHC 31.6 31.2  --  30.8  --  33.2 32.3  RDW 16.2* 16.4*  --  16.4*  --  15.4 15.6*    Chemistries   Recent Labs Lab 09/11/14 0919 09/11/14 1445 09/12/14 0520 09/13/14 0756 09/16/14 1324  NA 138 138 140  140 135 139  K 4.8 4.5 4.4  4.2 3.8 4.3  CL 98 99 103  102 99 105  CO2 27 26 25  28 28 25   GLUCOSE 92 76 85  90 104* 90  BUN 18 19 6  6 10 21   CREATININE 5.50* 5.53* 3.74*  3.75* 5.82* 7.52*  CALCIUM 7.8* 7.5* 7.8*  7.8* 7.3* 6.9*    CBG: No results for input(s): GLUCAP in the last 168 hours.  GFR Estimated Creatinine Clearance: 7.2 mL/min (by C-G formula based on Cr of 7.52).  Coagulation profile No results for input(s): INR, PROTIME in the last 168 hours.  Cardiac Enzymes No results for input(s): CKMB, TROPONINI, MYOGLOBIN in the last 168 hours.  Invalid input(s): CK  Invalid input(s): POCBNP No results for  input(s): DDIMER in the last 72 hours. No results for input(s): HGBA1C in the last 72 hours. No results for input(s): CHOL, HDL, LDLCALC, TRIG, CHOLHDL, LDLDIRECT in the last 72 hours. No results for input(s): TSH, T4TOTAL,  T3FREE, THYROIDAB in the last 72 hours.  Invalid input(s): FREET3 No results for input(s): VITAMINB12, FOLATE, FERRITIN, TIBC, IRON, RETICCTPCT in the last 72 hours. No results for input(s): LIPASE, AMYLASE in the last 72 hours.  Urine Studies No results for input(s): UHGB, CRYS in the last 72 hours.  Invalid input(s): UACOL, UAPR, USPG, UPH, UTP, UGL, UKET, UBIL, UNIT, UROB, ULEU, UEPI, UWBC, URBC, UBAC, CAST, UCOM, BILUA  MICROBIOLOGY: Recent Results (from the past 240 hour(s))  Culture, blood (routine x 2)     Status: None   Collection Time: 09/10/14 12:20 PM  Result Value Ref Range Status   Specimen Description BLOOD RIGHT WRIST  Final   Special Requests BOTTLES DRAWN AEROBIC ONLY [place]  Final   Culture  Setup Time   Final    09/10/2014 18:32 Performed at [facility]    Culture   Final    NO GROWTH 5 DAYS Performed at [facility]    Report Status 09/16/2014 FINAL  Final  Culture, blood (routine x 2)     Status: None   Collection Time: 09/10/14 12:30 PM  Result Value Ref Range Status   Specimen Description BLOOD LEFT WRIST  Final   Special Requests BOTTLES DRAWN AEROBIC ONLY 10CC  Final   Culture  Setup Time   Final    09/10/2014 18:32 Performed at [facility]    Culture   Final    NO GROWTH 5 DAYS Performed at [facility]    Report Status 09/16/2014 FINAL  Final  Surgical pcr screen     Status: None   Collection Time: 09/12/14  2:36 PM  Result Value Ref Range Status   MRSA, PCR NEGATIVE NEGATIVE Final   Staphylococcus aureus NEGATIVE NEGATIVE Final    Comment:        The Xpert SA Assay (FDA approved for NASAL specimens in patients over 22 years of age), is one component of a comprehensive  surveillance program.  Test performance has been validated by [employer] for patients greater than or equal to 83 year old. It is not intended to diagnose infection nor to guide or monitor treatment.     RADIOLOGY STUDIES/RESULTS: Dg Chest 2 View  09/10/2014   CLINICAL DATA:  Leukocytosis  EXAM: CHEST  2 VIEW  COMPARISON:  08/29/2014  FINDINGS: Bilateral mild apical pleural calcifications. Left apical calcifications which are new compared with prior chest x-ray dated 07/01/2012. There is a small-moderate left pleural effusion. There is no right pleural effusion. There is no pneumothorax. The cardiomediastinal silhouette is stable. No acute osseous abnormality.  IMPRESSION: 1. Small either moderate left pleural effusion. Left apical calcifications again noted which are new since the prior exam of 07/01/2012. These are likely postinflammatory but can be seen in the setting of tuberculosis. Correlate with PPD or QuantiFERON test.   Electronically Signed   By: Kathreen Devoid   On: 09/10/2014 10:35   Ct Abdomen Pelvis W Contrast  08/27/2014   CLINICAL DATA:  Acute onset of bright red blood per rectum, with periumbilical cramping abdominal pain. Initial encounter.  EXAM: CT ABDOMEN AND PELVIS WITH CONTRAST  TECHNIQUE: Multidetector CT imaging of the abdomen and pelvis was performed using the standard protocol following bolus administration of intravenous contrast.  CONTRAST:  178mL OMNIPAQUE IOHEXOL 300 MG/ML  SOLN  COMPARISON:  CT of the abdomen and pelvis from 11/10/2012  FINDINGS: Trace bilateral pleural effusions are seen, with bibasilar atelectasis.  A nonspecific 6 mm hypodensity within  the left hepatic lobe is of uncertain significance. The liver is otherwise unremarkable. The spleen is mildly bulky, but remains normal in length. There is persistent chronic gallbladder wall thickening. The gallbladder is difficult to fully assess given trace ascites tracking about the liver.  The pancreas and  adrenal glands are unremarkable.  The patient's native kidneys are markedly atrophic, with scattered bilateral renal cysts of varying size and attenuation. Scattered associated calcifications are seen. There is no evidence of hydronephrosis. No obstructing renal stones are seen.  There is marked fatty infiltration with regard to the transplant kidney at the left iliac fossa, with marked thinning of the renal parenchyma. Diffuse associated vascular calcifications are seen. This appearance is stable from 2014.  There is mild diffuse mesenteric and omental edema, nonspecific in appearance.  The stomach is within normal limits. No acute vascular abnormalities are seen. Diffuse calcification is seen along the abdominal aorta and its branches. Extensive diffuse vascular calcification is noted throughout the abdomen and pelvis.  The patient appears to be status post resection of much of the bowel. Remaining small bowel is grossly unremarkable. A distended anastomosis is noted at the right hemipelvis; this corresponds to the site of the prior right pelvic catheter, which has been removed.  The appendix is not definitely characterized; there is no evidence for appendicitis. Contrast progresses to the level of the proximal sigmoid colon. There is scattered diverticulosis along the transverse colon. Stool is noted filling the distal sigmoid colon and rectum.  There is soft tissue inflammation about the distal sigmoid colon and rectum, raising question for proctitis. Diffuse presacral stranding is also noted, new from the prior study.  The bladder is not well assessed. Soft tissue density about the expected location of the bladder is relatively stable in appearance. 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 This is perhaps slightly more prominent than on the prior study. No inguinal lymphadenopathy is seen.  No acute osseous  abnormalities are identified.  IMPRESSION: 1. Soft tissue inflammation about the distal sigmoid colon and rectum, with diffuse presacral stranding, new from the prior study. This raises concern for proctitis, which may explain the patient's bright red blood per rectum. Stool noted filling the distal sigmoid colon and rectum. 2. Scattered diverticulosis along the transverse colon, without evidence of diverticulitis; the colon is otherwise grossly unremarkable. 3. Trace bilateral pleural effusions, with bibasilar atelectasis. 4. Marked atrophy of the patient's bilateral native kidneys, with bilateral renal cysts and scattered calcifications. Marked fatty infiltration of the transplant kidney at the left iliac fossa, with marked thinning of the renal parenchyma. This appearance is stable from 2014. 5. Stable appearance to soft tissue density about the expected location of the bladder. 6. 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 7. Diffuse calcification along the abdominal aorta and its branches. Extensive diffuse vascular calcification seen throughout the abdomen and pelvis. 8. Nonspecific 6 mm hypodensity within the left hepatic lobe, new from prior studies. 9. Persistent chronic gallbladder wall thickening; gallbladder difficult to fully assess given trace ascites about the liver, but this appearance is relatively stable. 10. Mild nonspecific diffuse mesenteric and omental edema.   Electronically Signed   By: Garald Balding M.D.   On: 08/27/2014 01:30   Dg Chest Port 1 View  08/29/2014   CLINICAL DATA:  Central line placement.  EXAM: PORTABLE CHEST - 1 VIEW  COMPARISON:  07/03/2012  FINDINGS: Interval placement of a left central venous catheter. Tip projects to the left mediastinum over the aortic arch. This could be in the brachiocephalic vein, in a normal variant left superior vena  cava, or an arterial branch. No pneumothorax. Calcification in the left apex is likely postinflammatory. This is new since previous study. Heart size is normal. Hemidiaphragms are not included within the field of view.  IMPRESSION: Nonspecific placement of left central venous catheter to the left of midline, suggesting placement either in the brachiocephalic vein, left superior vena cava, or arterial branch. No pneumothorax.   Electronically Signed   By: Lucienne Capers M.D.   On: 08/29/2014 02:17   Dg Abd Portable 1v  08/26/2014   CLINICAL DATA:  Acute GI bleed  EXAM: PORTABLE ABDOMEN - 1 VIEW  COMPARISON:  CT abdomen pelvis dated 11/10/2012  FINDINGS: Nonspecific but nonobstructive bowel gas pattern.  Surgical clips overlying the pelvis.  Vascular calcifications.  IMPRESSION: Unremarkable abdominal radiograph.   Electronically Signed   By: Julian Hy M.D.   On: 08/26/2014 22:30   Dg Toe Great Left  09/10/2014   ADDENDUM REPORT: 09/10/2014 10:44  ADDENDUM: These results were called by telephone at the time of interpretation on 09/10/2014 at 10:44 am to Dr. Oren Binet , who verbally acknowledged these results.   Electronically Signed   By: Lowella Grip M.D.   On: 09/10/2014 10:44   09/10/2014   CLINICAL DATA:  Loss of blood flow to first toe ; pain  EXAM: LEFT FIRST TOE  COMPARISON:  None.  FINDINGS: Frontal, oblique, and lateral views were obtained. There is soft tissue swelling along the dorsal distal aspect of the first digit. There are scattered foci of air in the soft tissues, concerning for potential soft tissue infection in this area. There is no erosive change or bony destruction. No fracture or dislocation. Joint spaces appear intact. There is extensive arterial vascular calcification.  IMPRESSION: Soft tissue air is noted in the distal first digit. This finding is concerning for potential infection. Early gas gangrene could present in this manner and must be of concern. No bony  destruction. No fracture or dislocation. Extensive arterial vascular calcification. Suspect diabetes mellitus.  Electronically Signed: By: Lowella Grip M.D. On: 09/10/2014 10:39    Velvet Bathe, MD  [facility] Pager:[phone]  If 7PM-7AM, please contact night-coverage [email or web address] Password [email or web address] 09/17/2014, 11:55 AM   LOS: 9 days

## 2014-09-17 NOTE — Progress Notes (Signed)
Patient ID: [name], male   DOB: Mar 04, 1961, 53 y.o.   MRN: [id number] Dry gangrene of toes 1,2,  and 3 right foot stable-no evidence of cellulitis or infection Left first toe amputation site stable-skin edges slightly dark but no evidence of infection  Continue to observe left foot for healing

## 2014-09-17 NOTE — Progress Notes (Signed)
Patient ID: [name], male   DOB: 02/22/1961, 53 y.o.   MRN: [id number]  [facility] Progress Note   Assessment/ Plan:   1. Recurrent GIB with ABLA: No further hematochezia Colonoscopy 12/21 showed pandiverticulosis (thought to have been source of GIB) per Dr. Deatra Ina. 2. ESRD - TTS - HD was done yesterday-next hemodialysis again on Tuesday per his usual schedule 3. Hypertension/volume - BP well controlled- on physical exam, he is without any volume excess 4. Anemia - Improved s/p PRBCs, will continue to monitor now that he doesn't have further GI bleed 5. Metabolic bone disease - controlled with current calcitiriol/ sensipar/binders dose, phosphorus levels acceptable 6. Protein calorie malnutrition - persistently low albumin-likely indicative of his chronic inflammatory process from nonhealing wounds and the impact of recent surgery. 7. Severe PVD s/o recent bilateral fem pops in Oct and Nov with nonhealing foot wounds 8. Chronic Hep C + state- likely affecting albumin levels 9. Chronic corticosteroid use - for gout 10. Multiple wounds - ulcer tip of penis- local care, right foot toes/distal foot dry gangrene; left great toe amputation s/p osteo - Dr Trula Slade 12/22 on Vanc and Zosyn. Appreciate vascular surgery input.   Subjective:   Reports to be feeling better today with improvement of his leg pain.    Objective:   BP 95/61 mmHg  Pulse 80  Temp(Src) 98.9 F (37.2 C) (Oral)  Resp 15  Ht 5\' 11"  (1.803 m)  Wt 46.1 kg (101 lb 10.1 oz)  BMI 14.18 kg/m2  SpO2 100%  Physical Exam: Gen: Comfortably resting in bed CVS: Pulse regular in rate and rhythm, S1 and S2 normal Resp: Clear to auscultation, no rales Abd: Soft, flat, nontender Ext: No lower extremity edema-(dry) gangrenous right toes. Left leg in clean dressing.  Labs: BMET  Recent Labs Lab 09/11/14 0919 09/11/14 1445 09/12/14 0520 09/13/14 0756 09/16/14 1324  NA 138 138 140  140 135 139  K 4.8  4.5 4.4  4.2 3.8 4.3  CL 98 99 103  102 99 105  CO2 27 26 25  28 28 25   GLUCOSE 92 76 85  90 104* 90  BUN 18 19 6  6 10 21   CREATININE 5.50* 5.53* 3.74*  3.75* 5.82* 7.52*  CALCIUM 7.8* 7.5* 7.8*  7.8* 7.3* 6.9*  PHOS  --  4.4 3.3 4.1 4.2   CBC  Recent Labs Lab 09/12/14 0520 09/13/14 0755 09/14/14 2219 09/15/14 0302 09/15/14 1445 09/16/14 1324  WBC 8.1 9.2  --  12.6*  --  4.2  HGB 9.2* 8.9* 8.1* 7.3* 10.1* 8.5*  HCT 29.1* 28.5* 26.3* 23.7* 30.6* 25.6*  MCV 90.1 90.2  --  86.8  --  85.0  PLT 140* 131*  --  147*  --  137*    Medications:    . amLODipine  10 mg Oral Daily  . atorvastatin  40 mg Oral q1800  . bacitracin   Topical BID  . calcitRIOL  2.75 mcg Oral Q T,Th,Sa-HD  . calcium acetate  2,001 mg Oral TID WC  . carvedilol  6.25 mg Oral BID WC  . cephALEXin  500 mg Oral Daily  . cinacalcet  60 mg Oral Q breakfast  . colchicine  0.3 mg Oral Once per day on Mon Thu  . [START ON 09/23/2014] darbepoetin (ARANESP) injection - DIALYSIS  60 mcg Intravenous Q Sat-HD  . doxycycline  100 mg Oral Q12H  . feeding supplement (RESOURCE BREEZE)  1 Container Oral TID BM  .  ferric gluconate (FERRLECIT/NULECIT) IV  125 mg Intravenous Q T,Th,Sa-HD  . pantoprazole  40 mg Oral BID  . predniSONE  5 mg Oral Daily  . sodium chloride  3 mL Intravenous Q12H   Elmarie Shiley, MD 09/17/2014, 7:57 AM

## 2014-09-18 LAB — CBC
HCT: 28.4 % — ABNORMAL LOW (ref 39.0–52.0)
Hemoglobin: 9 g/dL — ABNORMAL LOW (ref 13.0–17.0)
MCH: 27.4 pg (ref 26.0–34.0)
MCHC: 31.7 g/dL (ref 30.0–36.0)
MCV: 86.6 fL (ref 78.0–100.0)
PLATELETS: 169 10*3/uL (ref 150–400)
RBC: 3.28 MIL/uL — AB (ref 4.22–5.81)
RDW: 15.8 % — ABNORMAL HIGH (ref 11.5–15.5)
WBC: 10.3 10*3/uL (ref 4.0–10.5)

## 2014-09-18 NOTE — Progress Notes (Signed)
PATIENT DETAILS Name: [name] Age: 53 y.o. Sex: male Date of Birth: 1961/04/17 Admit Date: 09/08/2014 Admitting Physician Berle Mull, MD PQZ:[id number],FHLKT L, MD  Brief narrative: According to last PN 53 year old male patient with known vascular disease, chronic kidney disease on dialysis, hypertension, chronic steroids for gout brought to the emergency room on 08/16/14 with severe right leg pain, evaluated by vascular surgery, and underwent right femoral to below the knee popliteal artery bypass graft on 11/27. Subsequently patient developed significant left leg swelling, with concerns for left venous stenosis related to his left thigh arteriovenous graft, on 12/3 patient underwent left thigh shuntogram with subsequent venoplasty and stenting of the left common femoral vein.Previous left foot swelling improved remarkably after this procedure. On 12/5 patient developed significant hematochezia, unfortunately developed hemorrhagic shock and required ICU transfer. He received a total of 11 units of PRBC, 4 units of FFP and temporarily required pressors to maintain blood pressure. Aspirin/Plavix were placed on hold. He slowly stabilized, hematochezia completely resolved, on 12/11 he started having brown bowel movements. GI planNED to do colonoscopy, however patient refused this bleeding had resolved.  Aspirin and Plavix were subsequently resumed, and patient was discharged home on 09/05/14. Unfortunately on 09/08/14, he had numerous recurrent hematochezia and presented back to the emergency room and was subsequently admitted. Patient has undergone a colonoscopy on 12/21 which is confirmed pandiverticulosis. No further bleeding since admission. Unfortunately , this hospital course has been complicated by left great toe infection plans are for a toe amputation on 12/22.  Subjective  Pt does not report any more episodes of bleeding, denies any SOB or  lightheadedness  Assessment/Plan: Principal Problem:   Lower GI bleed: Suspect this is secondary to diverticular disease. Prior GI bleeding in his previous admission was attributed it to probably ischemic colitis, he had refused colonoscopy that time. GI consulted, underwent colonoscopy on 12/22 which confirmed diverticulosis.  - Discontinue plavix - No more episodes of GI bleed reported. Last hemoglobin reported 9.0   Active Problems:   Mild acute blood loss anemia: Secondary to above, although hemoglobin was 12.3 on admission. Hgb levels have trended down with blood loss - steady at 9, no more bleeding reported.   Infected Left Great Toe/Osteomyelitis:left great toe with purulent foul smelling drainage-suspect likely cultprit for leukocytosis on 12/20. Xray shows possible early gas gangrene, was initially started on empiric Vanco/Zosyn.Blood culture on 12/20 negative. VVS following, and patient is s/p amputation 12/22 .Leukocytosis has resolved with Abx. - Continue oral antibiotics -PT evaluation       ESRD on hemodialysis: Nephrology consulted for dialysis. Regular schedule is TTS    History of CAD with prior drug-eluting stent April 2015:no acute cardiac issues, given recurrent hematochezia aspirin/Plavix held on admission. Colonoscopy demonstrated Diverticulosis. - Plavix will be held again given that patient rebled    History of recent right ischemic GYB:WLSLHT post right femoropopliteal bypass on 11/27, continues to have chronic dry gangrene changes in his right foot (see pic below)    History of recent left femoral vein occlusion: Underwent venoplasty of left common femoral vein during his last admission, no swelling evident in his left leg.    History of hypertension: Currently controlled, cautiously continue with Coreg and amlodipine.    History of gout: Continue with chronic prednisone and colchicine     Penile lesion:Likely necroses area secondary to known vascular diease and  recent hemorrhagic shock. Associate discussed case with Dr Matilde Sprang Urology on call  on 12/21 who reviewed chart and picture (taken 12/20), and recommended daily wound care and to apply bacitracin.  See picture below.He thought the area will demarcate and heal slowly. Remains stable    Deconditioning/generalized weakness: Secondary to recent prolonged hospitalization/acute illness -PT evaluation, patient reports he wants to go home once ready    Severe protein calorie malnutrition: Continue with supplements  Disposition: Remain inpatient-Home with home health services when ready  Antibiotics:  Cephalexin  Doxycycline   Anti-infectives    Start     Dose/Rate Route Frequency Ordered Stop   09/14/14 1200  doxycycline (VIBRA-TABS) tablet 100 mg     100 mg Oral Every 12 hours 09/14/14 1108     09/14/14 1200  cephALEXin (KEFLEX) capsule 500 mg     500 mg Oral Daily 09/14/14 1108     09/13/14 1500  vancomycin (VANCOCIN) 500 mg in sodium chloride 0.9 % 100 mL IVPB     500 mg100 mL/hr over 60 Minutes Intravenous  Once 09/13/14 1430 09/13/14 1720   09/12/14 1200  vancomycin (VANCOCIN) 500 mg in sodium chloride 0.9 % 100 mL IVPB  Status:  Discontinued     500 mg100 mL/hr over 60 Minutes Intravenous Every T-Th-Sa (Hemodialysis) 09/10/14 1035 09/11/14 1120   09/11/14 1200  vancomycin (VANCOCIN) 500 mg in sodium chloride 0.9 % 100 mL IVPB  Status:  Discontinued     500 mg100 mL/hr over 60 Minutes Intravenous Every Dialysis 09/11/14 1120 09/11/14 1122   09/11/14 1200  vancomycin (VANCOCIN) 500 mg in sodium chloride 0.9 % 100 mL IVPB     500 mg100 mL/hr over 60 Minutes Intravenous Every Mon (Hemodialysis) 09/11/14 1128 09/11/14 1755   09/11/14 1121  vancomycin (VANCOCIN) 500 mg in sodium chloride 0.9 % 100 mL IVPB  Status:  Discontinued     500 mg100 mL/hr over 60 Minutes Intravenous Every Dialysis 09/11/14 1122 09/11/14 1123   09/10/14 1400  clindamycin (CLEOCIN) IVPB 600 mg     600 mg100 mL/hr  over 30 Minutes Intravenous 3 times per day 09/10/14 1047 09/11/14 2359   09/10/14 1200  vancomycin (VANCOCIN) IVPB 1000 mg/200 mL premix     1,000 mg200 mL/hr over 60 Minutes Intravenous  Once 09/10/14 1035 09/10/14 1215   09/10/14 1200  piperacillin-tazobactam (ZOSYN) IVPB 2.25 g  Status:  Discontinued     2.25 g100 mL/hr over 30 Minutes Intravenous Every 8 hours 09/10/14 1035 09/14/14 1108      DVT Prophylaxis: SCD's  Code Status: Full code   Family Communication None at bedside- patient is awake/alert and understanding of above plan.  Procedures:  None  CONSULTS:  nephrology   MEDICATIONS: Scheduled Meds: . amLODipine  10 mg Oral Daily  . atorvastatin  40 mg Oral q1800  . bacitracin   Topical BID  . calcitRIOL  2.75 mcg Oral Q T,Th,Sa-HD  . calcium acetate  2,001 mg Oral TID WC  . carvedilol  6.25 mg Oral BID WC  . cephALEXin  500 mg Oral Daily  . cinacalcet  60 mg Oral Q breakfast  . colchicine  0.3 mg Oral Once per day on Mon Thu  . [START ON 09/23/2014] darbepoetin (ARANESP) injection - DIALYSIS  60 mcg Intravenous Q Sat-HD  . doxycycline  100 mg Oral Q12H  . feeding supplement (RESOURCE BREEZE)  1 Container Oral TID BM  . ferric gluconate (FERRLECIT/NULECIT) IV  125 mg Intravenous Q T,Th,Sa-HD  . pantoprazole  40 mg Oral BID  . predniSONE  5 mg  Oral Daily  . sodium chloride  3 mL Intravenous Q12H   Continuous Infusions:   PRN Meds:.acetaminophen, acetaminophen, metoCLOPramide, ondansetron **OR** ondansetron (ZOFRAN) IV, oxyCODONE    PHYSICAL EXAM: Vital signs in last 24 hours: Filed Vitals:   09/18/14 0358 09/18/14 0407 09/18/14 0600 09/18/14 0742  BP:  143/34 129/40 124/41  Pulse:  75 75 74  Temp:  98.3 F (36.8 C)  98.3 F (36.8 C)  TempSrc:    Oral  Resp:  18 12 17   Height:      Weight: 44.4 kg (97 lb 14.2 oz)     SpO2:  100% 100% 100%    Weight change: -2.3 kg (-5 lb 1.1 oz) Filed Weights   09/16/14 1640 09/17/14 0600 09/18/14 0358   Weight: 45.9 kg (101 lb 3.1 oz) 46.1 kg (101 lb 10.1 oz) 44.4 kg (97 lb 14.2 oz)   Body mass index is 13.66 kg/(m^2).   Gen Exam: Awake and alert with clear speech.   Neck: Supple, No JVD.   Chest: B/L Clear.   CVS: No edema Abdomen: non tender, non distended.  Extremities: no edema, lower extremities warm to touch.Foul smelling ulcer in left foot with purulent drainage.Dry gangrene in several toes in right foot Neurologic: Non Focal.   Skin: No Rash.   Wounds: see below     Right Foot with dry gangrene (below)   Left great toe (below)   Intake/Output from previous day:  Intake/Output Summary (Last 24 hours) at 09/18/14 1006 Last data filed at 09/18/14 0900  Gross per 24 hour  Intake    640 ml  Output      0 ml  Net    640 ml     LAB RESULTS: CBC  Recent Labs Lab 09/13/14 0755  09/15/14 0302 09/15/14 1445 09/16/14 1324 09/17/14 1044 09/18/14 0457  WBC 9.2  --  12.6*  --  4.2 9.2 10.3  HGB 8.9*  < > 7.3* 10.1* 8.5* 9.7* 9.0*  HCT 28.5*  < > 23.7* 30.6* 25.6* 30.0* 28.4*  PLT 131*  --  147*  --  137* 144* 169  MCV 90.2  --  86.8  --  85.0 86.0 86.6  MCH 28.2  --  26.7  --  28.2 27.8 27.4  MCHC 31.2  --  30.8  --  33.2 32.3 31.7  RDW 16.4*  --  16.4*  --  15.4 15.6* 15.8*  < > = values in this interval not displayed.  Chemistries   Recent Labs Lab 09/11/14 1445 09/12/14 0520 09/13/14 0756 09/16/14 1324  NA 138 140  140 135 139  K 4.5 4.4  4.2 3.8 4.3  CL 99 103  102 99 105  CO2 26 25  28 28 25   GLUCOSE 76 85  90 104* 90  BUN 19 6  6 10 21   CREATININE 5.53* 3.74*  3.75* 5.82* 7.52*  CALCIUM 7.5* 7.8*  7.8* 7.3* 6.9*    CBG: No results for input(s): GLUCAP in the last 168 hours.  GFR Estimated Creatinine Clearance: 7 mL/min (by C-G formula based on Cr of 7.52).  Coagulation profile No results for input(s): INR, PROTIME in the last 168 hours.  Cardiac Enzymes No results for input(s): CKMB, TROPONINI, MYOGLOBIN in the last 168  hours.  Invalid input(s): CK  Invalid input(s): POCBNP No results for input(s): DDIMER in the last 72 hours. No results for input(s): HGBA1C in the last 72 hours. No results for input(s): CHOL, HDL, LDLCALC, TRIG,  CHOLHDL, LDLDIRECT in the last 72 hours. No results for input(s): TSH, T4TOTAL, T3FREE, THYROIDAB in the last 72 hours.  Invalid input(s): FREET3 No results for input(s): VITAMINB12, FOLATE, FERRITIN, TIBC, IRON, RETICCTPCT in the last 72 hours. No results for input(s): LIPASE, AMYLASE in the last 72 hours.  Urine Studies No results for input(s): UHGB, CRYS in the last 72 hours.  Invalid input(s): UACOL, UAPR, USPG, UPH, UTP, UGL, UKET, UBIL, UNIT, UROB, ULEU, UEPI, UWBC, URBC, UBAC, CAST, UCOM, BILUA  MICROBIOLOGY: Recent Results (from the past 240 hour(s))  Culture, blood (routine x 2)     Status: None   Collection Time: 09/10/14 12:20 PM  Result Value Ref Range Status   Specimen Description BLOOD RIGHT WRIST  Final   Special Requests BOTTLES DRAWN AEROBIC ONLY [place]  Final   Culture  Setup Time   Final    09/10/2014 18:32 Performed at [facility]    Culture   Final    NO GROWTH 5 DAYS Performed at [facility]    Report Status 09/16/2014 FINAL  Final  Culture, blood (routine x 2)     Status: None   Collection Time: 09/10/14 12:30 PM  Result Value Ref Range Status   Specimen Description BLOOD LEFT WRIST  Final   Special Requests BOTTLES DRAWN AEROBIC ONLY 10CC  Final   Culture  Setup Time   Final    09/10/2014 18:32 Performed at [facility]    Culture   Final    NO GROWTH 5 DAYS Performed at [facility]    Report Status 09/16/2014 FINAL  Final  Surgical pcr screen     Status: None   Collection Time: 09/12/14  2:36 PM  Result Value Ref Range Status   MRSA, PCR NEGATIVE NEGATIVE Final   Staphylococcus aureus NEGATIVE NEGATIVE Final    Comment:        The Xpert SA Assay (FDA approved for NASAL specimens in patients  over 31 years of age), is one component of a comprehensive surveillance program.  Test performance has been validated by [employer] for patients greater than or equal to 55 year old. It is not intended to diagnose infection nor to guide or monitor treatment.     RADIOLOGY STUDIES/RESULTS: Dg Chest 2 View  09/10/2014   CLINICAL DATA:  Leukocytosis  EXAM: CHEST  2 VIEW  COMPARISON:  08/29/2014  FINDINGS: Bilateral mild apical pleural calcifications. Left apical calcifications which are new compared with prior chest x-ray dated 07/01/2012. There is a small-moderate left pleural effusion. There is no right pleural effusion. There is no pneumothorax. The cardiomediastinal silhouette is stable. No acute osseous abnormality.  IMPRESSION: 1. Small either moderate left pleural effusion. Left apical calcifications again noted which are new since the prior exam of 07/01/2012. These are likely postinflammatory but can be seen in the setting of tuberculosis. Correlate with PPD or QuantiFERON test.   Electronically Signed   By: Kathreen Devoid   On: 09/10/2014 10:35   Ct Abdomen Pelvis W Contrast  08/27/2014   CLINICAL DATA:  Acute onset of bright red blood per rectum, with periumbilical cramping abdominal pain. Initial encounter.  EXAM: CT ABDOMEN AND PELVIS WITH CONTRAST  TECHNIQUE: Multidetector CT imaging of the abdomen and pelvis was performed using the standard protocol following bolus administration of intravenous contrast.  CONTRAST:  146mL OMNIPAQUE IOHEXOL 300 MG/ML  SOLN  COMPARISON:  CT of the abdomen and pelvis from 11/10/2012  FINDINGS: Trace bilateral pleural  effusions are seen, with bibasilar atelectasis.  A nonspecific 6 mm hypodensity within the left hepatic lobe is of uncertain significance. The liver is otherwise unremarkable. The spleen is mildly bulky, but remains normal in length. There is persistent chronic gallbladder wall thickening. The gallbladder is difficult to fully assess given  trace ascites tracking about the liver.  The pancreas and adrenal glands are unremarkable.  The patient's native kidneys are markedly atrophic, with scattered bilateral renal cysts of varying size and attenuation. Scattered associated calcifications are seen. There is no evidence of hydronephrosis. No obstructing renal stones are seen.  There is marked fatty infiltration with regard to the transplant kidney at the left iliac fossa, with marked thinning of the renal parenchyma. Diffuse associated vascular calcifications are seen. This appearance is stable from 2014.  There is mild diffuse mesenteric and omental edema, nonspecific in appearance.  The stomach is within normal limits. No acute vascular abnormalities are seen. Diffuse calcification is seen along the abdominal aorta and its branches. Extensive diffuse vascular calcification is noted throughout the abdomen and pelvis.  The patient appears to be status post resection of much of the bowel. Remaining small bowel is grossly unremarkable. A distended anastomosis is noted at the right hemipelvis; this corresponds to the site of the prior right pelvic catheter, which has been removed.  The appendix is not definitely characterized; there is no evidence for appendicitis. Contrast progresses to the level of the proximal sigmoid colon. There is scattered diverticulosis along the transverse colon. Stool is noted filling the distal sigmoid colon and rectum.  There is soft tissue inflammation about the distal sigmoid colon and rectum, raising question for proctitis. Diffuse presacral stranding is also noted, new from the prior study.  The bladder is not well assessed. Soft tissue density about the expected location of the bladder is relatively stable in appearance. 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 This is perhaps slightly more prominent than on the prior study. No  inguinal lymphadenopathy is seen.  No acute osseous abnormalities are identified.  IMPRESSION: 1. Soft tissue inflammation about the distal sigmoid colon and rectum, with diffuse presacral stranding, new from the prior study. This raises concern for proctitis, which may explain the patient's bright red blood per rectum. Stool noted filling the distal sigmoid colon and rectum. 2. Scattered diverticulosis along the transverse colon, without evidence of diverticulitis; the colon is otherwise grossly unremarkable. 3. Trace bilateral pleural effusions, with bibasilar atelectasis. 4. Marked atrophy of the patient's bilateral native kidneys, with bilateral renal cysts and scattered calcifications. Marked fatty infiltration of the transplant kidney at the left iliac fossa, with marked thinning of the renal parenchyma. This appearance is stable from 2014. 5. Stable appearance to soft tissue density about the expected location of the bladder. 6. 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 7. Diffuse calcification along the abdominal aorta and its branches. Extensive diffuse vascular calcification seen throughout the abdomen and pelvis. 8. Nonspecific 6 mm hypodensity within the left hepatic lobe, new from prior studies. 9. Persistent chronic gallbladder wall thickening; gallbladder difficult to fully assess given trace ascites about the liver, but this appearance is relatively stable. 10. Mild nonspecific diffuse mesenteric and omental edema.   Electronically Signed   By: Garald Balding M.D.   On: 08/27/2014 01:30   Dg Chest  Port 1 View  08/29/2014   CLINICAL DATA:  Central line placement.  EXAM: PORTABLE CHEST - 1 VIEW  COMPARISON:  07/03/2012  FINDINGS: Interval placement of a left central venous catheter. Tip projects to the left mediastinum over the aortic arch. This could be in the  brachiocephalic vein, in a normal variant left superior vena cava, or an arterial branch. No pneumothorax. Calcification in the left apex is likely postinflammatory. This is new since previous study. Heart size is normal. Hemidiaphragms are not included within the field of view.  IMPRESSION: Nonspecific placement of left central venous catheter to the left of midline, suggesting placement either in the brachiocephalic vein, left superior vena cava, or arterial branch. No pneumothorax.   Electronically Signed   By: Lucienne Capers M.D.   On: 08/29/2014 02:17   Dg Abd Portable 1v  08/26/2014   CLINICAL DATA:  Acute GI bleed  EXAM: PORTABLE ABDOMEN - 1 VIEW  COMPARISON:  CT abdomen pelvis dated 11/10/2012  FINDINGS: Nonspecific but nonobstructive bowel gas pattern.  Surgical clips overlying the pelvis.  Vascular calcifications.  IMPRESSION: Unremarkable abdominal radiograph.   Electronically Signed   By: Julian Hy M.D.   On: 08/26/2014 22:30   Dg Toe Great Left  09/10/2014   ADDENDUM REPORT: 09/10/2014 10:44  ADDENDUM: These results were called by telephone at the time of interpretation on 09/10/2014 at 10:44 am to Dr. Oren Binet , who verbally acknowledged these results.   Electronically Signed   By: Lowella Grip M.D.   On: 09/10/2014 10:44   09/10/2014   CLINICAL DATA:  Loss of blood flow to first toe ; pain  EXAM: LEFT FIRST TOE  COMPARISON:  None.  FINDINGS: Frontal, oblique, and lateral views were obtained. There is soft tissue swelling along the dorsal distal aspect of the first digit. There are scattered foci of air in the soft tissues, concerning for potential soft tissue infection in this area. There is no erosive change or bony destruction. No fracture or dislocation. Joint spaces appear intact. There is extensive arterial vascular calcification.  IMPRESSION: Soft tissue air is noted in the distal first digit. This finding is concerning for potential infection. Early gas gangrene  could present in this manner and must be of concern. No bony destruction. No fracture or dislocation. Extensive arterial vascular calcification. Suspect diabetes mellitus.  Electronically Signed: By: Lowella Grip M.D. On: 09/10/2014 10:39    Velvet Bathe, MD  [facility] Pager:[phone]  If 7PM-7AM, please contact night-coverage [email or web address] Password [email or web address] 09/18/2014, 10:06 AM   LOS: 10 days

## 2014-09-18 NOTE — Care Management Note (Signed)
    Page 1 of 1   09/18/2014     10:55:46 AM CARE MANAGEMENT NOTE 09/18/2014  Patient:  [name], [name]   Account Number:  [id number]  Date Initiated:  09/12/2014  Documentation initiated by:  ROYAL,CHERYL  Subjective/Objective Assessment:   CM following for progression and d/c planning.     Action/Plan:   09/05/2014 Met with pt and IM given and explained. Pt states understanding, is anxious to be able to d/c from the hospital.   Anticipated DC Date:     Anticipated DC Plan:  [place]         Choice offered to / List presented to:             [place].   Status of service:  Completed, signed off Medicare Important Message given?  YES (If response is "NO", the following Medicare IM given date fields will be blank) Date Medicare IM given:  09/12/2014 Medicare IM given by:  ROYAL,CHERYL Date Additional Medicare IM given:  09/18/2014 Additional Medicare IM given by:  [facility] Ryelan [place]  Discharge Disposition:    Per UR Regulation:    If discussed at Long Length of Stay Meetings, dates discussed:   09/19/2014    Comments:  09/12/2014 Pt setup with [facility] for [place] services at the time of last d/c, will continue these services.   CRoyal RN MPH, case Freight forwarder.

## 2014-09-18 NOTE — Progress Notes (Signed)
Patient transferred to [place] in bed with RN and tech.  Alert and oriented x4.  No complaints of pain at this time.  Patient belongings in room, placed in closet.  Placed on box 6e10, NSR.  Patient refused bed alarm this evening.  Oriented to room, unit, and staff.  Will continue to monitor.

## 2014-09-18 NOTE — Progress Notes (Signed)
[facility] Progress Note   Subjective: No complaints , stable  Filed Vitals:   09/18/14 0358 09/18/14 0407 09/18/14 0600 09/18/14 0742  BP:  143/34 129/40 124/41  Pulse:  75 75 74  Temp:  98.3 F (36.8 C)  98.3 F (36.8 C)  TempSrc:    Oral  Resp:  18 12 17   Height:      Weight: 44.4 kg (97 lb 14.2 oz)     SpO2:  100% 100% 100%   Exam: Gen: Comfortably resting in bed CVS: Pulse regular in rate and rhythm, S1 and S2 normal Resp: Clear to auscultation, no rales Abd: Soft, flat, nontender Ext: No lower extremity edema-(dry) gangrenous right toes. Left leg in clean dressing L thigh AVG patent Neuro is nf, Ox 3  Dialysis Orders: TTS @ [facility]  3.5h     46.5 kg 2K/2Ca 400/800 Profile 2 Heparin none L thigh AVG  Calcitriol 2.75 mcg Aranesp 60 mcg on Thurs Venofer 50 per week Recent labs from 12/17 Hgb 11.3 Fe 22, 27% sat K 3.9 Ca 9.4 P 3 Alb 2.9 iPTH 217 WBC 11.56  Assessment: 1. Recurrent GIB with ABLA: No further hematochezia Colonoscopy 12/21 showed pandiverticulosis (thought to have been source of GIB) per Dr. Deatra Ina. 2. ESRD - TTS HD 3. Hypertension/volume - BP stable, below dry wt, no vol excess on exam 4. Anemia - Improved s/p PRBCs 5. Metabolic bone disease - controlled with current calcitiriol/ sensipar/binders dose, phosphorus levels acceptable 6. Protein calorie malnutrition - persistently low albumin-likely indicative of his chronic inflammatory process from nonhealing wounds and the impact of recent surgery. 7. Severe PVD - recent R fem pop, prior LLE perc revasc this year as well 8. S/P L great toe amp - 12/22 for osteomyelitis 9. Chronic Hep C + state- likely affecting albumin levels 10. Chronic corticosteroid use - for gout 11. Multiple wounds - ulcer tip of penis- local care, right foot toes/distal foot dry gangrene  Plan- HD Tuesday    [name] MD  pager [phone]    cell [phone]  09/18/2014,  9:46 AM     Recent Labs Lab 09/12/14 0520 09/13/14 0756 09/16/14 1324  NA 140  140 135 139  K 4.4  4.2 3.8 4.3  CL 103  102 99 105  CO2 25  28 28 25   GLUCOSE 85  90 104* 90  BUN 6  6 10 21   CREATININE 3.74*  3.75* 5.82* 7.52*  CALCIUM 7.8*  7.8* 7.3* 6.9*  PHOS 3.3 4.1 4.2    Recent Labs Lab 09/12/14 0520 09/13/14 0756 09/16/14 1324  ALBUMIN 1.7* 1.7* 1.4*    Recent Labs Lab 09/16/14 1324 09/17/14 1044 09/18/14 0457  WBC 4.2 9.2 10.3  HGB 8.5* 9.7* 9.0*  HCT 25.6* 30.0* 28.4*  MCV 85.0 86.0 86.6  PLT 137* 144* 169   . amLODipine  10 mg Oral Daily  . atorvastatin  40 mg Oral q1800  . bacitracin   Topical BID  . calcitRIOL  2.75 mcg Oral Q T,Th,Sa-HD  . calcium acetate  2,001 mg Oral TID WC  . carvedilol  6.25 mg Oral BID WC  . cephALEXin  500 mg Oral Daily  . cinacalcet  60 mg Oral Q breakfast  . colchicine  0.3 mg Oral Once per day on Mon Thu  . [START ON 09/23/2014] darbepoetin (ARANESP) injection - DIALYSIS  60 mcg Intravenous Q Sat-HD  . doxycycline  100 mg Oral Q12H  . feeding supplement (RESOURCE BREEZE)  1 Container Oral TID BM  . ferric gluconate (FERRLECIT/NULECIT) IV  125 mg Intravenous Q T,Th,Sa-HD  . pantoprazole  40 mg Oral BID  . predniSONE  5 mg Oral Daily  . sodium chloride  3 mL Intravenous Q12H     acetaminophen, acetaminophen, metoCLOPramide, ondansetron **OR** ondansetron (ZOFRAN) IV, oxyCODONE

## 2014-09-18 NOTE — Progress Notes (Addendum)
       Patient has necrotic skin edges around left hallux amputation site.  No changes from previous exam. Dry gangrene of toes 1,2, and 3 right foot stable-no evidence of cellulitis or infection  We will continue to follow  COLLINS, EMMA MAUREEN PA-C  Darker colored skin dorsal left foot not sure if this is a bruise or skin breakdown, toe amp healing so far Otherwise dry gangrene right foot unchanged  Ruta Hinds, MD [facility]: [phone] Pager: [phone]

## 2014-09-18 NOTE — Progress Notes (Signed)
Physical Therapy Treatment Patient Details Name: [name] MRN: [id number] DOB: [birth year] Today's Date: 09/18/2014    History of Present Illness Patient is a 53 yo male admitted 09/08/14 with recurrent GIB.  Was d/c from hospital on 09/05/14 with similar issue.  PMH:  severe PVD with multiple revascularization procedures and dry gangrene Rt foot, ESRD on HD, renal transplant, HTN, CHF, Hep C, anemia, CAD, PCI 01/13/14.Marland Kitchen  Underwent amputation of Left great toe on 09/12/14.    PT Comments    Patient tolerated some in room ambulation but limited by fatigue.  Overall mobilizing well with use of RW.  Will continue to progress as tolerated.  Follow Up Recommendations  Home health PT;Supervision - Intermittent     Equipment Recommendations  None recommended by PT    Recommendations for Other Services       Precautions / Restrictions Precautions Precautions: Fall Required Braces or Orthoses: Other Brace/Splint Other Brace/Splint: Darco for left foot; Darco for right foot. Restrictions Weight Bearing Restrictions: No Other Position/Activity Restrictions: Weight bearing through Left heel    Mobility  Bed Mobility Overal bed mobility: Modified Independent Bed Mobility: Supine to Sit;Sit to Supine     Supine to sit: Modified independent (Device/Increase time) Sit to supine: Modified independent (Device/Increase time)   General bed mobility comments: used railing during supine to sit.  Able to scoot self up in bed using railings.  Transfers Overall transfer level: Needs assistance Equipment used: Rolling walker (2 wheeled) Transfers: Sit to/from Stand Sit to Stand: Supervision;Min guard         General transfer comment: Vcs for hand placement when coming to standing  Ambulation/Gait Ambulation/Gait assistance: Min guard Ambulation Distance (Feet): 18 Feet Assistive device: Rolling walker (2 wheeled) Gait Pattern/deviations: Step-to pattern;Decreased stride  length;Narrow base of support Gait velocity: Decreased Gait velocity interpretation: Below normal speed for age/gender General Gait Details: Deferred due to fatigue and pain   Stairs            Wheelchair Mobility    Modified Rankin (Stroke Patients Only)       Balance Overall balance assessment: Needs assistance   Sitting balance-Leahy Scale: Good     Standing balance support: During functional activity Standing balance-Leahy Scale: Fair Standing balance comment: Patient stood EOB with RW for functional tasks and self care. Patient then able to ambulate around room and over to chair, tolerated well through heel, reports less pain                    Cognition Arousal/Alertness: Awake/alert Behavior During Therapy: WFL for tasks assessed/performed Overall Cognitive Status: Within Functional Limits for tasks assessed Area of Impairment: Attention;Problem solving   Current Attention Level: Sustained         Problem Solving: Slow processing General Comments: patient asking for help for pericare, had difficulty problem solving to do it on his own, required cues    Exercises      General Comments        Pertinent Vitals/Pain Pain Assessment: 0-10 Pain Score: 3  Pain Location: left foot Pain Descriptors / Indicators: Constant;Discomfort Pain Intervention(s): Limited activity within patient's tolerance;Repositioned    Home Living                      Prior Function            PT Goals (current goals can now be found in the care plan section) Acute Rehab PT Goals Patient Stated Goal:  to walk without pain PT Goal Formulation: With patient Time For Goal Achievement: 09/28/14 Potential to Achieve Goals: Good Progress towards PT goals: Progressing toward goals    Frequency  Min 3X/week    PT Plan Current plan remains appropriate    Co-evaluation             End of Session Equipment Utilized During Treatment: Gait belt Activity  Tolerance: Patient limited by fatigue Patient left: in chair     Time: 5974-1638 PT Time Calculation (min) (ACUTE ONLY): 16 min  Charges:  $Therapeutic Activity: 8-22 mins                    G CodesDuncan Dull 09-23-14, 1:04 PM Alben Deeds, [place] DPT  [phone]

## 2014-09-19 LAB — TYPE AND SCREEN
ABO/RH(D): B POS
ANTIBODY SCREEN: POSITIVE
DAT, IgG: NEGATIVE
DONOR AG TYPE: NEGATIVE
Donor AG Type: NEGATIVE
Donor AG Type: NEGATIVE
Donor AG Type: NEGATIVE
UNIT DIVISION: 0
UNIT DIVISION: 0
Unit division: 0
Unit division: 0

## 2014-09-19 LAB — RENAL FUNCTION PANEL
ANION GAP: 11 (ref 5–15)
Albumin: 1.5 g/dL — ABNORMAL LOW (ref 3.5–5.2)
BUN: 17 mg/dL (ref 6–23)
CHLORIDE: 101 meq/L (ref 96–112)
CO2: 25 mmol/L (ref 19–32)
Calcium: 7.2 mg/dL — ABNORMAL LOW (ref 8.4–10.5)
Creatinine, Ser: 8.39 mg/dL — ABNORMAL HIGH (ref 0.50–1.35)
GFR calc non Af Amer: 6 mL/min — ABNORMAL LOW (ref >90)
GFR, EST AFRICAN AMERICAN: 7 mL/min — AB (ref >90)
GLUCOSE: 105 mg/dL — AB (ref 70–99)
POTASSIUM: 3.8 mmol/L (ref 3.5–5.1)
Phosphorus: 3.4 mg/dL (ref 2.3–4.6)
SODIUM: 137 mmol/L (ref 135–145)

## 2014-09-19 LAB — CBC
HCT: 30 % — ABNORMAL LOW (ref 39.0–52.0)
HCT: 24.5 % — ABNORMAL LOW (ref 39.0–52.0)
HEMOGLOBIN: 9.5 g/dL — AB (ref 13.0–17.0)
HEMOGLOBIN: 8 g/dL — AB (ref 13.0–17.0)
MCH: 27.5 pg (ref 26.0–34.0)
MCH: 28.9 pg (ref 26.0–34.0)
MCHC: 31.7 g/dL (ref 30.0–36.0)
MCHC: 32.7 g/dL (ref 30.0–36.0)
MCV: 87 fL (ref 78.0–100.0)
MCV: 88.4 fL (ref 78.0–100.0)
Platelets: 277 10*3/uL (ref 150–400)
Platelets: 188 10*3/uL (ref 150–400)
RBC: 3.45 MIL/uL — ABNORMAL LOW (ref 4.22–5.81)
RBC: 2.77 MIL/uL — AB (ref 4.22–5.81)
RDW: 16.3 % — ABNORMAL HIGH (ref 11.5–15.5)
RDW: 16.4 % — ABNORMAL HIGH (ref 11.5–15.5)
WBC: 16.9 10*3/uL — ABNORMAL HIGH (ref 4.0–10.5)
WBC: 10.2 10*3/uL (ref 4.0–10.5)

## 2014-09-19 MED ORDER — NEPRO/CARBSTEADY PO LIQD: 237.0000 mL | ORAL | Status: DC | PRN

## 2014-09-19 MED ORDER — LIDOCAINE-PRILOCAINE 2.5-2.5 % EX CREA: 1.0000 "application " | TOPICAL_CREAM | CUTANEOUS | Status: DC | PRN

## 2014-09-19 MED ORDER — ALTEPLASE 2 MG IJ SOLR
2.0000 mg | Freq: Once | INTRAMUSCULAR | Status: DC | PRN
  Filled 2014-09-19: qty 2

## 2014-09-19 MED ORDER — PENTAFLUOROPROP-TETRAFLUOROETH EX AERO: 1.0000 "application " | INHALATION_SPRAY | CUTANEOUS | Status: DC | PRN

## 2014-09-19 MED ORDER — SODIUM CHLORIDE 0.9 % IV SOLN: 100.0000 mL | INTRAVENOUS | Status: DC | PRN

## 2014-09-19 MED ORDER — HEPARIN SODIUM (PORCINE) 1000 UNIT/ML DIALYSIS
1000.0000 [IU] | INTRAMUSCULAR | Status: DC | PRN
  Filled 2014-09-19: qty 1

## 2014-09-19 MED ORDER — SODIUM CHLORIDE 0.9 % IV BOLUS (SEPSIS)
500.0000 mL | Freq: Once | INTRAVENOUS | Status: AC
  Administered 2014-09-19: 500 mL via INTRAVENOUS

## 2014-09-19 MED ORDER — LIDOCAINE HCL (PF) 1 % IJ SOLN: 5.0000 mL | INTRAMUSCULAR | Status: DC | PRN

## 2014-09-19 NOTE — Progress Notes (Signed)
Subjective:  On hd , no further  Bloody BMS  Objective Vital signs in last 24 hours: Filed Vitals:   09/19/14 0500 09/19/14 1009 09/19/14 1210 09/19/14 1215  BP: 114/39 120/34 117/53 134/54  Pulse: 79 84 81 85  Temp: 98.4 F (36.9 C) 98 F (36.7 C) 97.8 F (36.6 C)   TempSrc: Oral Oral Oral   Resp: 16 20 19    Height:      Weight:   46.2 kg (101 lb 13.6 oz)   SpO2: 95% 99% 99%    Weight change: 2.8 kg (6 lb 2.8 oz)  Physical Exam: General : ON  HD , NAD Card- RRR no rub, gallop or mur Resp: CTA  bilat Abd: BS +, Soft, NT, ND Ext: No lower extremity edema-(dry) gangrenous right toes. Left leg in clean dressing L thigh AVG patent on HD  Dialysis Orders: TTS @ [facility]  3.5h 46.5 kg 2K/2Ca 400/800 Profile 2 Heparin none L thigh AVG  Calcitriol 2.75 mcg Aranesp 60 mcg on Thurs Venofer 50 per week Recent labs from 12/17 Hgb 11.3 Fe 22, 27% sat K 3.9 Ca 9.4 P 3 Alb 2.9 iPTH 217 WBC 11.56  Problem/Plan: 1. Recurrent GIB with ABLA: No further hematochezia Colonoscopy 12/21 showed pandiverticulosis (thought to have been source of GIB) per Dr. Deatra Ina. hgb pending pre hd / no hep hd 2. ESRD - TTS HD ( [place]) 3. Hypertension/volume - BP stable 134/54 pre hd,  He  Is below edw, no vol excess on exam 4. Anemia -yest  hgb 9.5 from 7.3  Pending now pre hd Improved s/p PRBCs/ pending today 20mcg Aranesp ^ 150 5. Metabolic bone disease - controlled with current calcitiriol/ sensipar/binders dose, phos 4.2 6. Protein calorie malnutrition - persistently low albumin-likely indicative of his chronic inflammatory process from nonhealing wounds and the impact of recent surgery. and Hep C 7. Severe PVD - recent R fem pop, prior LLE perc revasc this year as well/ [facility] seeing 8. S/P L great toe amp - 12/22 for osteomyelitis 9. Chronic Hep C + state-   10. Chronic corticosteroid use - for gout 11.  PVD  With Multiple wounds - ulcer tip of penis- local care,  right foot toes/distal foot dry gangrene = [facility] seeing   Ernest Haber, PA-C [place] [phone] 09/19/2014,12:35 PM  LOS: 11 days   Pt seen, examined and agree w A/P as above.  [name] MD pager [phone]    cell [phone] 09/19/2014, 1:54 PM    Labs: Basic Metabolic Panel:  Recent Labs Lab 09/13/14 0756 09/16/14 1324  NA 135 139  K 3.8 4.3  CL 99 105  CO2 28 25  GLUCOSE 104* 90  BUN 10 21  CREATININE 5.82* 7.52*  CALCIUM 7.3* 6.9*  PHOS 4.1 4.2   Liver Function Tests:  Recent Labs Lab 09/13/14 0756 09/16/14 1324  ALBUMIN 1.7* 1.4*   CBC:  Recent Labs Lab 09/15/14 0302  09/16/14 1324 09/17/14 1044 09/18/14 0457 09/19/14 0218  WBC 12.6*  --  4.2 9.2 10.3 16.9*  HGB 7.3*  < > 8.5* 9.7* 9.0* 9.5*  HCT 23.7*  < > 25.6* 30.0* 28.4* 30.0*  MCV 86.8  --  85.0 86.0 86.6 87.0  PLT 147*  --  137* 144* 169 277  < > = values in this interval not displayed. CBG: No results for input(s): GLUCAP in the last 168 hours. Medications:   . amLODipine  10 mg Oral Daily  . atorvastatin  40 mg Oral q1800  . bacitracin   Topical BID  . calcitRIOL  2.75 mcg Oral Q T,Th,Sa-HD  . calcium acetate  2,001 mg Oral TID WC  . carvedilol  6.25 mg Oral BID WC  . cephALEXin  500 mg Oral Daily  . cinacalcet  60 mg Oral Q breakfast  . colchicine  0.3 mg Oral Once per day on Mon Thu  . [START ON 09/23/2014] darbepoetin (ARANESP) injection - DIALYSIS  60 mcg Intravenous Q Sat-HD  . doxycycline  100 mg Oral Q12H  . ferric gluconate (FERRLECIT/NULECIT) IV  125 mg Intravenous Q T,Th,Sa-HD  . pantoprazole  40 mg Oral BID  . predniSONE  5 mg Oral Daily  . sodium chloride  3 mL Intravenous Q12H

## 2014-09-19 NOTE — Progress Notes (Signed)
500 cc bolus of normal saline ordered and is being administered now.  Patient continues to deny dizziness and lightheadedness.  Will continue to monitor.

## 2014-09-19 NOTE — Progress Notes (Signed)
Hemoglobin and hematocrit ordered per Lamar Blinks, NP.  Will continue to monitor.

## 2014-09-19 NOTE — Progress Notes (Signed)
Hemodialysis- Pt began to "feel bad" during last 5 minutes of treatment. Rinsed back and additional 200cc bolus given. Pt states relief. BP 97/49 HR 92, no further complaints. Total uf 700cc.

## 2014-09-19 NOTE — Progress Notes (Signed)
PATIENT DETAILS Name: [name] Age: 53 y.o. Sex: male Date of Birth: 03-24-1961 Admit Date: 09/08/2014 Admitting Physician Berle Mull, MD OBS:[id number],UTMLY L, MD  Brief narrative: According to last PN 53 year old male patient with known vascular disease, chronic kidney disease on dialysis, hypertension, chronic steroids for gout brought to the emergency room on 08/16/14 with severe right leg pain, evaluated by vascular surgery, and underwent right femoral to below the knee popliteal artery bypass graft on 11/27. Subsequently patient developed significant left leg swelling, with concerns for left venous stenosis related to his left thigh arteriovenous graft, on 12/3 patient underwent left thigh shuntogram with subsequent venoplasty and stenting of the left common femoral vein.Previous left foot swelling improved remarkably after this procedure. On 12/5 patient developed significant hematochezia, unfortunately developed hemorrhagic shock and required ICU transfer. He received a total of 11 units of PRBC, 4 units of FFP and temporarily required pressors to maintain blood pressure. Aspirin/Plavix were placed on hold. He slowly stabilized, hematochezia completely resolved, on 12/11 he started having brown bowel movements. GI planNED to do colonoscopy, however patient refused this bleeding had resolved.  Aspirin and Plavix were subsequently resumed, and patient was discharged home on 09/05/14. Unfortunately on 09/08/14, he had numerous recurrent hematochezia and presented back to the emergency room and was subsequently admitted. Patient has undergone a colonoscopy on 12/21 which is confirmed pandiverticulosis. No further bleeding since admission. Unfortunately , this hospital course has been complicated by left great toe infection pt s/p toe amputation on 12/22. Was started on plavix again and developed GI bleed again.  Subjective  Pt does not report any more episodes of bleeding, denies  any SOB or lightheadedness  Assessment/Plan: Principal Problem:   Lower GI bleed: Suspect this is secondary to diverticular disease. Prior GI bleeding in his previous admission was attributed it to probably ischemic colitis, he had refused colonoscopy that time. GI consulted, underwent colonoscopy on 12/22 which confirmed diverticulosis.  - Discontinue plavix - Had another bloody stool overnight. Last hgb level 8.0   Active Problems:   Mild acute blood loss anemia: Secondary to above, although hemoglobin was 12.3 on admission. Hgb levels have trended down. - Continue to monitor. Plan on transfusion should hgb level drop below 8.0   Infected Left Great Toe/Osteomyelitis:left great toe with purulent foul smelling drainage-suspect likely cultprit for leukocytosis on 12/20. Xray shows possible early gas gangrene, was initially started on empiric Vanco/Zosyn.Blood culture on 12/20 negative. VVS following, and patient is s/p amputation 12/22 .Leukocytosis has resolved with Abx. - Continue oral antibiotics -PT evaluation       ESRD on hemodialysis:  - Nephrology consulted for dialysis. Regular schedule is TTS    History of CAD with prior drug-eluting stent April 2015:no acute cardiac issues, given recurrent hematochezia aspirin/Plavix held on admission. Colonoscopy demonstrated Diverticulosis. 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    History of hypertension: Currently controlled, cautiously continue with Coreg and amlodipine.    History of gout: Continue with chronic prednisone and colchicine     Penile lesion: Likely necroses area secondary  to known vascular diease and  recent hemorrhagic shock. Associate discussed case with Dr Matilde Sprang Urology on call on 12/21 who reviewed chart and picture (taken 12/20), and recommended daily wound care and to apply bacitracin.  See picture below.He thought the area will demarcate and heal slowly. Remains stable    Deconditioning/generalized weakness: Secondary to recent prolonged hospitalization/acute illness -PT evaluation, patient reports he wants to go home once ready    Severe protein calorie malnutrition: Continue with supplements  Disposition: Remain inpatient-Home with home health services when ready  Antibiotics:  Cephalexin  Doxycycline   Anti-infectives    Start     Dose/Rate Route Frequency Ordered Stop   09/14/14 1200  doxycycline (VIBRA-TABS) tablet 100 mg     100 mg Oral Every 12 hours 09/14/14 1108     09/14/14 1200  cephALEXin (KEFLEX) capsule 500 mg     500 mg Oral Daily 09/14/14 1108     09/13/14 1500  vancomycin (VANCOCIN) 500 mg in sodium chloride 0.9 % 100 mL IVPB     500 mg100 mL/hr over 60 Minutes Intravenous  Once 09/13/14 1430 09/13/14 1720   09/12/14 1200  vancomycin (VANCOCIN) 500 mg in sodium chloride 0.9 % 100 mL IVPB  Status:  Discontinued     500 mg100 mL/hr over 60 Minutes Intravenous Every T-Th-Sa (Hemodialysis) 09/10/14 1035 09/11/14 1120   09/11/14 1200  vancomycin (VANCOCIN) 500 mg in sodium chloride 0.9 % 100 mL IVPB  Status:  Discontinued     500 mg100 mL/hr over 60 Minutes Intravenous Every Dialysis 09/11/14 1120 09/11/14 1122   09/11/14 1200  vancomycin (VANCOCIN) 500 mg in sodium chloride 0.9 % 100 mL IVPB     500 mg100 mL/hr over 60 Minutes Intravenous Every Mon (Hemodialysis) 09/11/14 1128 09/11/14 1755   09/11/14 1121  vancomycin (VANCOCIN) 500 mg in sodium chloride 0.9 % 100 mL IVPB  Status:  Discontinued     500 mg100 mL/hr over 60 Minutes Intravenous Every Dialysis 09/11/14 1122 09/11/14 1123   09/10/14 1400  clindamycin (CLEOCIN) IVPB  600 mg     600 mg100 mL/hr over 30 Minutes Intravenous 3 times per day 09/10/14 1047 09/11/14 2359   09/10/14 1200  vancomycin (VANCOCIN) IVPB 1000 mg/200 mL premix     1,000 mg200 mL/hr over 60 Minutes Intravenous  Once 09/10/14 1035 09/10/14 1215   09/10/14 1200  piperacillin-tazobactam (ZOSYN) IVPB 2.25 g  Status:  Discontinued     2.25 g100 mL/hr over 30 Minutes Intravenous Every 8 hours 09/10/14 1035 09/14/14 1108      DVT Prophylaxis: SCD's  Code Status: Full code   Family Communication None at bedside- patient is awake/alert and understanding of above plan.  Procedures:  None  CONSULTS:  nephrology   MEDICATIONS: Scheduled Meds: . amLODipine  10 mg Oral Daily  . atorvastatin  40 mg Oral q1800  . bacitracin   Topical BID  . calcitRIOL  2.75 mcg Oral Q T,Th,Sa-HD  . calcium acetate  2,001 mg Oral TID WC  . carvedilol  6.25 mg Oral BID WC  . cephALEXin  500 mg Oral Daily  . cinacalcet  60 mg Oral Q breakfast  . colchicine  0.3 mg Oral Once per day on Mon Thu  . [START ON 09/23/2014] darbepoetin (ARANESP) injection - DIALYSIS  60 mcg Intravenous Q Sat-HD  . doxycycline  100 mg Oral Q12H  . ferric gluconate (FERRLECIT/NULECIT) IV  125 mg Intravenous Q T,Th,Sa-HD  . pantoprazole  40 mg Oral BID  . predniSONE  5 mg  Oral Daily  . sodium chloride  3 mL Intravenous Q12H   Continuous Infusions:   PRN Meds:.acetaminophen, acetaminophen, metoCLOPramide, ondansetron **OR** ondansetron (ZOFRAN) IV, oxyCODONE    PHYSICAL EXAM: Vital signs in last 24 hours: Filed Vitals:   09/19/14 1420 09/19/14 1500 09/19/14 1542 09/19/14 1543  BP: 131/40 122/56 123/62 97/49  Pulse: 92 100 106 92  Temp:    97.6 F (36.4 C)  TempSrc:      Resp:    20  Height:      Weight:    45.8 kg (100 lb 15.5 oz)  SpO2:    99%    Weight change: 2.8 kg (6 lb 2.8 oz) Filed Weights   09/18/14 2042 09/19/14 1210 09/19/14 1543  Weight: 47.2 kg (104 lb 0.9 oz) 46.2 kg (101 lb 13.6 oz) 45.8 kg (100  lb 15.5 oz)   Body mass index is 13.69 kg/(m^2).   Gen Exam: Awake and alert with clear speech.   Neck: Supple, No JVD.   Chest: B/L Clear.   CVS: No edema Abdomen: non tender, non distended.  Extremities: no edema, lower extremities warm to touch.Foul smelling ulcer in left foot with purulent drainage.Dry gangrene in several toes in right foot Neurologic: Non Focal.   Skin: No Rash.   Wounds: see below     Right Foot with dry gangrene (below)   Left great toe (below)   Intake/Output from previous day:  Intake/Output Summary (Last 24 hours) at 09/19/14 1659 Last data filed at 09/19/14 1543  Gross per 24 hour  Intake    240 ml  Output    747 ml  Net   -507 ml     LAB RESULTS: CBC  Recent Labs Lab 09/16/14 1324 09/17/14 1044 09/18/14 0457 09/19/14 0218 09/19/14 1221  WBC 4.2 9.2 10.3 16.9* 10.2  HGB 8.5* 9.7* 9.0* 9.5* 8.0*  HCT 25.6* 30.0* 28.4* 30.0* 24.5*  PLT 137* 144* 169 277 188  MCV 85.0 86.0 86.6 87.0 88.4  MCH 28.2 27.8 27.4 27.5 28.9  MCHC 33.2 32.3 31.7 31.7 32.7  RDW 15.4 15.6* 15.8* 16.3* 16.4*    Chemistries   Recent Labs Lab 09/13/14 0756 09/16/14 1324 09/19/14 1222  NA 135 139 137  K 3.8 4.3 3.8  CL 99 105 101  CO2 28 25 25   GLUCOSE 104* 90 105*  BUN 10 21 17   CREATININE 5.82* 7.52* 8.39*  CALCIUM 7.3* 6.9* 7.2*    CBG: No results for input(s): GLUCAP in the last 168 hours.  GFR Estimated Creatinine Clearance: 6.4 mL/min (by C-G formula based on Cr of 8.39).  Coagulation profile No results for input(s): INR, PROTIME in the last 168 hours.  Cardiac Enzymes No results for input(s): CKMB, TROPONINI, MYOGLOBIN in the last 168 hours.  Invalid input(s): CK  Invalid input(s): POCBNP No results for input(s): DDIMER in the last 72 hours. No results for input(s): HGBA1C in the last 72 hours. No results for input(s): CHOL, HDL, LDLCALC, TRIG, CHOLHDL, LDLDIRECT in the last 72 hours. No results for input(s): TSH, T4TOTAL,  T3FREE, THYROIDAB in the last 72 hours.  Invalid input(s): FREET3 No results for input(s): VITAMINB12, FOLATE, FERRITIN, TIBC, IRON, RETICCTPCT in the last 72 hours. No results for input(s): LIPASE, AMYLASE in the last 72 hours.  Urine Studies No results for input(s): UHGB, CRYS in the last 72 hours.  Invalid input(s): UACOL, UAPR, USPG, UPH, UTP, UGL, UKET, UBIL, UNIT, UROB, ULEU, UEPI, UWBC, URBC, UBAC, CAST, UCOM, BILUA  MICROBIOLOGY:  Recent Results (from the past 240 hour(s))  Culture, blood (routine x 2)     Status: None   Collection Time: 09/10/14 12:20 PM  Result Value Ref Range Status   Specimen Description BLOOD RIGHT WRIST  Final   Special Requests BOTTLES DRAWN AEROBIC ONLY 8CC  Final   Culture  Setup Time   Final    09/10/2014 18:32 Performed at [facility]    Culture   Final    NO GROWTH 5 DAYS Performed at [facility]    Report Status 09/16/2014 FINAL  Final  Culture, blood (routine x 2)     Status: None   Collection Time: 09/10/14 12:30 PM  Result Value Ref Range Status   Specimen Description BLOOD LEFT WRIST  Final   Special Requests BOTTLES DRAWN AEROBIC ONLY 10CC  Final   Culture  Setup Time   Final    09/10/2014 18:32 Performed at [facility]    Culture   Final    NO GROWTH 5 DAYS Performed at [facility]    Report Status 09/16/2014 FINAL  Final  Surgical pcr screen     Status: None   Collection Time: 09/12/14  2:36 PM  Result Value Ref Range Status   MRSA, PCR NEGATIVE NEGATIVE Final   Staphylococcus aureus NEGATIVE NEGATIVE Final    Comment:        The Xpert SA Assay (FDA approved for NASAL specimens in patients over 61 years of age), is one component of a comprehensive surveillance program.  Test performance has been validated by [employer] for patients greater than or equal to 1 year old. It is not intended to diagnose infection nor to guide or monitor treatment.     RADIOLOGY  STUDIES/RESULTS: Dg Chest 2 View  09/10/2014   CLINICAL DATA:  Leukocytosis  EXAM: CHEST  2 VIEW  COMPARISON:  08/29/2014  FINDINGS: Bilateral mild apical pleural calcifications. Left apical calcifications which are new compared with prior chest x-ray dated 07/01/2012. There is a small-moderate left pleural effusion. There is no right pleural effusion. There is no pneumothorax. The cardiomediastinal silhouette is stable. No acute osseous abnormality.  IMPRESSION: 1. Small either moderate left pleural effusion. Left apical calcifications again noted which are new since the prior exam of 07/01/2012. These are likely postinflammatory but can be seen in the setting of tuberculosis. Correlate with PPD or QuantiFERON test.   Electronically Signed   By: Kathreen Devoid   On: 09/10/2014 10:35   Ct Abdomen Pelvis W Contrast  08/27/2014   CLINICAL DATA:  Acute onset of bright red blood per rectum, with periumbilical cramping abdominal pain. Initial encounter.  EXAM: CT ABDOMEN AND PELVIS WITH CONTRAST  TECHNIQUE: Multidetector CT imaging of the abdomen and pelvis was performed using the standard protocol following bolus administration of intravenous contrast.  CONTRAST:  139mL OMNIPAQUE IOHEXOL 300 MG/ML  SOLN  COMPARISON:  CT of the abdomen and pelvis from 11/10/2012  FINDINGS: Trace bilateral pleural effusions are seen, with bibasilar atelectasis.  A nonspecific 6 mm hypodensity within the left hepatic lobe is of uncertain significance. The liver is otherwise unremarkable. The spleen is mildly bulky, but remains normal in length. There is persistent chronic gallbladder wall thickening. The gallbladder is difficult to fully assess given trace ascites tracking about the liver.  The pancreas and adrenal glands are unremarkable.  The patient's native kidneys are markedly atrophic, with scattered bilateral renal cysts of varying size and attenuation. Scattered associated calcifications are  seen. There is no evidence of  hydronephrosis. No obstructing renal stones are seen.  There is marked fatty infiltration with regard to the transplant kidney at the left iliac fossa, with marked thinning of the renal parenchyma. Diffuse associated vascular calcifications are seen. This appearance is stable from 2014.  There is mild diffuse mesenteric and omental edema, nonspecific in appearance.  The stomach is within normal limits. No acute vascular abnormalities are seen. Diffuse calcification is seen along the abdominal aorta and its branches. Extensive diffuse vascular calcification is noted throughout the abdomen and pelvis.  The patient appears to be status post resection of much of the bowel. Remaining small bowel is grossly unremarkable. A distended anastomosis is noted at the right hemipelvis; this corresponds to the site of the prior right pelvic catheter, which has been removed.  The appendix is not definitely characterized; there is no evidence for appendicitis. Contrast progresses to the level of the proximal sigmoid colon. There is scattered diverticulosis along the transverse colon. Stool is noted filling the distal sigmoid colon and rectum.  There is soft tissue inflammation about the distal sigmoid colon and rectum, raising question for proctitis. Diffuse presacral stranding is also noted, new from the prior study.  The bladder is not well assessed. Soft tissue density about the expected location of the bladder is relatively stable in appearance. 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 This is perhaps slightly more prominent than on the prior study. No inguinal lymphadenopathy is seen.  No acute osseous abnormalities are identified.  IMPRESSION: 1. Soft tissue inflammation about the distal sigmoid colon and rectum, with diffuse presacral stranding, new from the prior study. This raises concern for proctitis, which may explain the  patient's bright red blood per rectum. Stool noted filling the distal sigmoid colon and rectum. 2. Scattered diverticulosis along the transverse colon, without evidence of diverticulitis; the colon is otherwise grossly unremarkable. 3. Trace bilateral pleural effusions, with bibasilar atelectasis. 4. Marked atrophy of the patient's bilateral native kidneys, with bilateral renal cysts and scattered calcifications. Marked fatty infiltration of the transplant kidney at the left iliac fossa, with marked thinning of the renal parenchyma. This appearance is stable from 2014. 5. Stable appearance to soft tissue density about the expected location of the bladder. 6. 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 7. Diffuse calcification along the abdominal aorta and its branches. Extensive diffuse vascular calcification seen throughout the abdomen and pelvis. 8. Nonspecific 6 mm hypodensity within the left hepatic lobe, new from prior studies. 9. Persistent chronic gallbladder wall thickening; gallbladder difficult to fully assess given trace ascites about the liver, but this appearance is relatively stable. 10. Mild nonspecific diffuse mesenteric and omental edema.   Electronically Signed   By: Garald Balding M.D.   On: 08/27/2014 01:30   Dg Chest Port 1 View  08/29/2014   CLINICAL DATA:  Central line placement.  EXAM: PORTABLE CHEST - 1 VIEW  COMPARISON:  07/03/2012  FINDINGS: Interval placement of a left central venous catheter. Tip projects to the left mediastinum over the aortic arch. This could be in the brachiocephalic vein, in a normal variant left superior vena cava, or an arterial branch. No pneumothorax. Calcification in the left apex is likely postinflammatory. This is new since previous study. Heart size is normal. Hemidiaphragms are not included  within the field of view.  IMPRESSION:  Nonspecific placement of left central venous catheter to the left of midline, suggesting placement either in the brachiocephalic vein, left superior vena cava, or arterial branch. No pneumothorax.   Electronically Signed   By: Lucienne Capers M.D.   On: 08/29/2014 02:17   Dg Abd Portable 1v  08/26/2014   CLINICAL DATA:  Acute GI bleed  EXAM: PORTABLE ABDOMEN - 1 VIEW  COMPARISON:  CT abdomen pelvis dated 11/10/2012  FINDINGS: Nonspecific but nonobstructive bowel gas pattern.  Surgical clips overlying the pelvis.  Vascular calcifications.  IMPRESSION: Unremarkable abdominal radiograph.   Electronically Signed   By: Julian Hy M.D.   On: 08/26/2014 22:30   Dg Toe Great Left  09/10/2014   ADDENDUM REPORT: 09/10/2014 10:44  ADDENDUM: These results were called by telephone at the time of interpretation on 09/10/2014 at 10:44 am to Dr. Oren Binet , who verbally acknowledged these results.   Electronically Signed   By: Lowella Grip M.D.   On: 09/10/2014 10:44   09/10/2014   CLINICAL DATA:  Loss of blood flow to first toe ; pain  EXAM: LEFT FIRST TOE  COMPARISON:  None.  FINDINGS: Frontal, oblique, and lateral views were obtained. There is soft tissue swelling along the dorsal distal aspect of the first digit. There are scattered foci of air in the soft tissues, concerning for potential soft tissue infection in this area. There is no erosive change or bony destruction. No fracture or dislocation. Joint spaces appear intact. There is extensive arterial vascular calcification.  IMPRESSION: Soft tissue air is noted in the distal first digit. This finding is concerning for potential infection. Early gas gangrene could present in this manner and must be of concern. No bony destruction. No fracture or dislocation. Extensive arterial vascular calcification. Suspect diabetes mellitus.  Electronically Signed: By: Lowella Grip M.D. On: 09/10/2014 10:39    Velvet Bathe, MD  [facility] Pager:[phone]  If 7PM-7AM, please contact night-coverage [email or web address] Password [email or web address] 09/19/2014, 4:59 PM   LOS: 11 days

## 2014-09-19 NOTE — Procedures (Signed)
I was present at this dialysis session, have reviewed the session itself and made  appropriate changes  [name] MD (pgr) [phone]    ([phone] 09/19/2014, 2:34 PM

## 2014-09-19 NOTE — Progress Notes (Signed)
  [facility] Progress Note  09/19/2014 12:20 PM 7 Days Post-Op  Subjective: Patient seen in HD. Having minor pain. Had dark bloody stool last night.   Physical Exam: Darkening around left great toe amputation site. Dry gangrene of right 1st-3rd toes and partial right 4th and 5th toes. Right foot warm. Palpable right femoral pulse.   Assessment/Plan: No signs of infection bilaterally. Will continue to monitor.  Will likely need future right transmetatarsal amputation at the very minimum.   Virgina Jock, PA-C [facility] Office: [phone] Pager: [phone] 09/19/2014 12:20 PM

## 2014-09-19 NOTE — Progress Notes (Signed)
PT Cancellation Note  Patient Details Name: [name] MRN: [id number] DOB: 07/27/1961   Cancelled Treatment:    Reason Eval/Treat Not Completed: Fatigue/lethargy limiting ability to participate. Pt reports he is very tired. He states that he is about to go down for HD and does not want to do anything right now. Will continue to follow and check back as schedule allows.    Rolinda Roan 09/19/2014, 11:33 AM   Rolinda Roan, PT, DPT [facility] Pager: [phone]

## 2014-09-19 NOTE — Progress Notes (Signed)
NUTRITION FOLLOW UP  Pt meets criteria for SEVERE MALNUTRITION in the context of chronic illness as evidenced by severe fat and muscle mass loss.  DOCUMENTATION CODES Per approved criteria  -Severe Malnutrition in the context of chronic illness -Underweight   INTERVENTION: Provide nourishment snacks (tuna). Ordered.  Encourage adequate PO intake.  NUTRITION DIAGNOSIS: Increased nutrient needs related to chronic illness, ESRD as evidenced by estimated nutrition needs; ongoing  Goal: Pt to meet >/= 90% of their estimated nutrition needs; met  Monitor:  PO intake, weight trends, labs, I/O's  53 y.o. male  Admitting Dx: GI bleed  ASSESSMENT: Pt with Past medical history of ESRD on hemodialysis Tuesday Thursday Saturday, hypertension, renal transplant patient, on chronic prednisone, chronic systolic heart failure, GERD, recent admission for GI bleed. Patient presents with complaints of bleeding in the bowels.  Procedure 12/21: Colonoscopy-Colonoscopy demonstrated pandiverticulosis.  Procedure(12/22) AMPUTATION DIGIT-LEFT GREAT TOE (Left)  Meal completion has varied from 20-100%. Pt reports his appetite is fine. Pt had dark bloody stools last night and currently with no further bloody stools. Resource Gwyneth Revels has been discontinued. Pt reports he would like nourishment snacks ordered (tuna). Will order. Pt was encouraged to eat his food at meals.  Labs and medication reviewed.  Height: Ht Readings from Last 1 Encounters:  09/18/14 6' (1.829 m)    Weight: Wt Readings from Last 1 Encounters:  09/19/14 101 lb 13.6 oz (46.2 kg)    BMI:  Body mass index is 13.81 kg/(m^2). Underweight  Re-Estimated Nutritional Needs: Kcal: 1800-2000 Protein: 75-95 grams Fluid: 1.2 L/day  Skin: Stage II pressure ulcer on sacrum, incision on right leg, laceration on upper right great toe, RLE non-pitting edema  Diet Order: Diet renal/carb modified with 1200 ml fluid  restriction   Intake/Output Summary (Last 24 hours) at 09/19/14 1514 Last data filed at 09/19/14 1010  Gross per 24 hour  Intake    240 ml  Output      0 ml  Net    240 ml    Last BM: 12/28  Labs:   Recent Labs Lab 09/13/14 0756 09/16/14 1324 09/19/14 1222  NA 135 139 137  K 3.8 4.3 3.8  CL 99 105 101  CO2 $Re'28 25 25  'KiX$ BUN $R'10 21 17  'Mi$ CREATININE 5.82* 7.52* 8.39*  CALCIUM 7.3* 6.9* 7.2*  PHOS 4.1 4.2 3.4  GLUCOSE 104* 90 105*    CBG (last 3)  No results for input(s): GLUCAP in the last 72 hours.  Scheduled Meds: . amLODipine  10 mg Oral Daily  . atorvastatin  40 mg Oral q1800  . bacitracin   Topical BID  . calcitRIOL  2.75 mcg Oral Q T,Th,Sa-HD  . calcium acetate  2,001 mg Oral TID WC  . carvedilol  6.25 mg Oral BID WC  . cephALEXin  500 mg Oral Daily  . cinacalcet  60 mg Oral Q breakfast  . colchicine  0.3 mg Oral Once per day on Mon Thu  . [START ON 09/23/2014] darbepoetin (ARANESP) injection - DIALYSIS  60 mcg Intravenous Q Sat-HD  . doxycycline  100 mg Oral Q12H  . ferric gluconate (FERRLECIT/NULECIT) IV  125 mg Intravenous Q T,Th,Sa-HD  . pantoprazole  40 mg Oral BID  . predniSONE  5 mg Oral Daily  . sodium chloride  3 mL Intravenous Q12H    Continuous Infusions:    Past Medical History  Diagnosis Date  . ESRD on hemodialysis     a. ESRD 2/2 HTN with renal transplant  in 1987 (cadaveric) after short period of dialysis;  b. Transplant failed in 2004 and he went back on HD;  c. As of 10/15 getting HD via L thigh AVG on a TTS schedule at [facility] on [facility].  . Hypertension   . Hx of kidney transplant     a. 1987-> back on HD since 2004  . Gout tophi   . Chronic steroid use     a. Has severe gout. Did not tolerate Allopurinol. Do not taper per PCP   . Systolic CHF, chronic     2 D echo 04/2012 with EF of 45 %   . Malnutrition   . Hx SBO 04/2012    a. 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 F/U with Dr Donne Hazel. Started on TNA  . Anemia associated with chronic renal failure   . Hepatitis C   . GERD (gastroesophageal reflux disease)   . H/O hiatal hernia   . Arthritis   . History of DVT (deep vein thrombosis)   . Peripheral vascular disease     a. 12/2013 PTA of L Pop;  b. 12/2013 PTA R pop, R DP;  c. 06/2014 L Pop CBA/DCB PTA.  . Prostate cancer   . CAD (coronary artery disease)     a. 12/2013 Cath/PCI: EF 45-50%, LM Ca2+, LAD 30-40p, 75m, D1/2/3 min irregs, LCX 50ost, 30-30m, RCA 70p, 95/37m (Rota->3.0x23 Xience distal, 3.0x23 Xience mid, 3.0x28 Xience prox).  . Chronic diastolic CHF (congestive heart failure)     a. 12/2013 Echo: EF 55-60%, mild LVH, nl wall motion, Gr 2 DD.  . Dry gangrene     a. L great toe    Past Surgical History  Procedure Laterality Date  . Laparotomy  04/14/2012    Procedure: EXPLORATORY LAPAROTOMY;  Surgeon: Joyice Faster. Cornett, MD;  Location: [place];  Service: General;  Laterality: N/A;  . Colon resection  04/14/2012  . Laparotomy  04/22/2012    Procedure: EXPLORATORY LAPAROTOMY;  Surgeon: Adin Hector, MD;  Location: [place];  Service: General;  Laterality: N/A;  lysis of adhesions, omentoplasty, repair small bowel  . Thrombectomy w/ embolectomy  04/27/2012    Procedure: THROMBECTOMY ARTERIOVENOUS GORE-TEX GRAFT;  Surgeon: Angelia Mould, MD;  Location: [place];  Service: Vascular;  Laterality: Left;  Thrombectomy of left thigh arteriovenous gortex graft  . Prostectomy  2011  . Renal grafts    . Pelvic abcess drainage Right 6/14    removal drain s/p bowl resection 13  . Transanal excision of rectal mass N/A 03/30/2013    Procedure: EXCISION OF anal MASS;  Surgeon: Joyice Faster. Cornett, MD;  Location: [place];  Service: General;  Laterality: N/A;  Exam under anesthesia with excision anal verge mass  . Aortogram  08/15/2014    abd aortogram  . Femoral-popliteal bypass graft Right 08/18/2014    Procedure: BYPASS GRAFT  FEMORAL-POPLITEAL ARTERY with Gortex Graft;  Surgeon: Serafina Mitchell, MD;  Location: [place];  Service: Vascular;  Laterality: Right;  . Shuntogram Left 09/29/2012    Procedure: SHUNTOGRAM;  Surgeon: Serafina Mitchell, MD;  Location: [facility] CATH LAB;  Service: Cardiovascular;  Laterality: Left;  . Abdominal aortagram N/A 01/03/2014    Procedure: ABDOMINAL Maxcine Ham;  Surgeon: Serafina Mitchell, MD;  Location: [place] CATH LAB;  Service: Cardiovascular;  Laterality: N/A;  . Abdominal aortagram Right 01/11/2014    Procedure: ABDOMINAL AORTAGRAM;  Surgeon:  Serafina Mitchell, MD;  Location: [facility] CATH LAB;  Service: Cardiovascular;  Laterality: Right;  . Left heart catheterization with coronary angiogram N/A 01/12/2014    Procedure: LEFT HEART CATHETERIZATION WITH CORONARY ANGIOGRAM;  Surgeon: Leonie Man, MD;  Location: [facility] C[facility] LAB;  Service: Cardiovascular;  Laterality: N/A;  . Percutaneous coronary rotoblator intervention (pci-r) N/A 01/13/2014    Procedure: PERCUTANEOUS CORONARY ROTOBLATOR INTERVENTION (PCI-R);  Surgeon: Burnell Blanks, MD;  Location: [facility] CATH LAB;  Service: Cardiovascular;  Laterality: N/A;  . Abdominal aortagram N/A 07/19/2014    Procedure: ABDOMINAL AORTAGRAM;  Surgeon: Serafina Mitchell, MD;  Location: [facility] CATH LAB;  Service: Cardiovascular;  Laterality: N/A;  . Shuntogram N/A 08/24/2014    Procedure: Earney Mallet;  Surgeon: Conrad Marshfield, MD;  Location: [facility] CATH LAB;  Service: Cardiovascular;  Laterality: N/A;  . Colonoscopy N/A 09/11/2014    Procedure: COLONOSCOPY;  Surgeon: Inda Castle, MD;  Location: [place];  Service: Endoscopy;  Laterality: N/A;  . Amputation Left 09/12/2014    Procedure: AMPUTATION DIGIT-LEFT GREAT TOE;  Surgeon: Serafina Mitchell, MD;  Location: [place];  Service: Vascular;  Laterality: Left;    Kallie Locks, MS, RD, LDN Pager # [phone] After hours/ weekend pager # [phone]

## 2014-09-19 NOTE — Progress Notes (Signed)
Patient passed x2 dark bloody stools with large clots.  Notified Lamar Blinks, NP.  No new orders at this time.  Patient in no acute distress.  Vital signs stable.  Will continue to monitor.

## 2014-09-19 NOTE — Progress Notes (Signed)
Patient continuously passes bloody stool with dark, large clots.  Notified Lamar Blinks, NP.  No complaints of lightheadedness or dizziness.  Will continue to monitor.

## 2014-09-20 DIAGNOSIS — A480 Gas gangrene: Secondary | ICD-10-CM

## 2014-09-20 LAB — CBC
HCT: 24.9 % — ABNORMAL LOW (ref 39.0–52.0)
HEMOGLOBIN: 8 g/dL — AB (ref 13.0–17.0)
MCH: 29.1 pg (ref 26.0–34.0)
MCHC: 32.1 g/dL (ref 30.0–36.0)
MCV: 90.5 fL (ref 78.0–100.0)
Platelets: 175 10*3/uL (ref 150–400)
RBC: 2.75 MIL/uL — AB (ref 4.22–5.81)
RDW: 17.1 % — ABNORMAL HIGH (ref 11.5–15.5)
WBC: 13.1 10*3/uL — AB (ref 4.0–10.5)

## 2014-09-20 MED ORDER — CALCIUM ACETATE 667 MG PO CAPS
667.0000 mg | ORAL_CAPSULE | Freq: Three times a day (TID) | ORAL | Status: DC
  Administered 2014-09-20 (×2): 667 mg via ORAL
  Filled 2014-09-20 (×3): qty 1

## 2014-09-20 MED ORDER — AMLODIPINE BESYLATE 5 MG PO TABS
5.0000 mg | ORAL_TABLET | Freq: Every day | ORAL | Status: DC
  Administered 2014-09-21: 5 mg via ORAL
  Filled 2014-09-20 (×2): qty 1

## 2014-09-20 NOTE — Progress Notes (Addendum)
PATIENT DETAILS Name: [name] Age: 53 y.o. Sex: male Date of Birth: 03/28/1961 Admit Date: 09/08/2014 Admitting Physician Berle Mull, MD FBX:[id number],[id number] L, MD  Brief narrative: According to last PN 53 year old male patient with known vascular disease, chronic kidney disease on dialysis, hypertension, chronic steroids for gout brought to the emergency room on 08/16/14 with severe right leg pain, evaluated by vascular surgery, and underwent right femoral to below the knee popliteal artery bypass graft on 11/27. Subsequently, patient developed significant left leg swelling, with concerns for venous stenosis related to his left thigh arteriovenous graft. On 12/3 patient underwent left thigh shuntogram with subsequent venoplasty and stenting of the left common femoral vein.Previous left foot swelling improved remarkably after this procedure. On 12/5 patient developed significant hematochezia, unfortunately developed hemorrhagic shock and required ICU transfer. He received a total of 11 units of PRBC, 4 units of FFP and temporarily required pressors to maintain blood pressure. Aspirin/Plavix were placed on hold. He slowly stabilized, hematochezia completely resolved, on 12/11 he started having brown bowel movements. GI planned to do colonoscopy, however patient refused this as bleeding had resolved.  Aspirin and Plavix were subsequently resumed, and patient was discharged home on 09/05/14.   Unfortunately on 09/08/14, he had numerous episodes of recurrent hematochezia, presented back to the emergency room and was subsequently admitted. Patient underwent a colonoscopy on 12/21 which confirmed pandiverticulosis.  Unfortunately , this hospital course has been complicated by left great toe infection pt s/p toe amputation on 12/22. Was started on plavix again and developed GI bleed again.  Subjective Last episode of bloody stool was greater than 24 hours ago  Assessment/Plan: Principal  Problem:   Lower GI bleed: Suspect this is secondary to diverticular disease. Prior GI bleeding in his previous admission was attributed it to ischemic colitis- he  refused colonoscopy that time.  -GI consulted, underwent colonoscopy on 12/22 which confirmed diverticulosis.  - Discontinued plavix- resumption depending upon GI recommendations   Active Problems:   Mild acute blood loss anemia: Secondary to above, although hemoglobin was 12.3 on admission. Hgb levels have trended down. - Continue to monitor. Plan on transfusion should hgb level drop below 8.0   Infected Left Great Toe/Osteomyelitis:left great toe with purulent foul smelling drainage-suspect likely cultprit for leukocytosis on 12/20. Xray shows possible early gas gangrene, was initially started on empiric Vanco/Zosyn.Blood culture on 12/20 negative. VVS following, and patient is s/p amputation 12/22 .Leukocytosis has resolved with Abx. - Continue doxycycline-DC cephalexin -PT evaluation - home health PT      ESRD on hemodialysis:  - Nephrology consulted for dialysis. Regular schedule is TTS    History of CAD with prior drug-eluting stent April 2015:no acute cardiac issues, given recurrent hematochezia aspirin/Plavix held on admission. Colonoscopy demonstrated Diverticulosis. 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    History of hypertension: Currently controlled, cautiously continue with Coreg and amlodipine.    History of gout: Continue with chronic prednisone and colchicine     Penile lesion: Likely necroses area secondary to known vascular diease  and recent hemorrhagic shock.  - discussed case with  Dr Matilde Sprang Urology on call on 12/21 who reviewed chart and picture (taken 12/20), and recommended daily wound care and  bacitracin.  See picture below. He suspected the area will demarcate and heal slowly. Remains stable    Deconditioning/generalized weakness: Secondary to recent prolonged hospitalization/acute illness -PT evaluation, patient reports he wants to go home once ready    Severe protein calorie malnutrition: Continue with supplements  Disposition: Remain inpatient-Home with home health services when ready  Antibiotics:  Cephalexin- 12/24>> 12/30  Doxycycline-12/24>>   Anti-infectives    Start     Dose/Rate Route Frequency Ordered Stop   09/14/14 1200  doxycycline (VIBRA-TABS) tablet 100 mg     100 mg Oral Every 12 hours 09/14/14 1108     09/14/14 1200  cephALEXin (KEFLEX) capsule 500 mg     500 mg Oral Daily 09/14/14 1108     09/13/14 1500  vancomycin (VANCOCIN) 500 mg in sodium chloride 0.9 % 100 mL IVPB     500 mg100 mL/hr over 60 Minutes Intravenous  Once 09/13/14 1430 09/13/14 1720   09/12/14 1200  vancomycin (VANCOCIN) 500 mg in sodium chloride 0.9 % 100 mL IVPB  Status:  Discontinued     500 mg100 mL/hr over 60 Minutes Intravenous Every T-Th-Sa (Hemodialysis) 09/10/14 1035 09/11/14 1120   09/11/14 1200  vancomycin (VANCOCIN) 500 mg in sodium chloride 0.9 % 100 mL IVPB  Status:  Discontinued     500 mg100 mL/hr over 60 Minutes Intravenous Every Dialysis 09/11/14 1120 09/11/14 1122   09/11/14 1200  vancomycin (VANCOCIN) 500 mg in sodium chloride 0.9 % 100 mL IVPB     500 mg100 mL/hr over 60 Minutes Intravenous Every Mon (Hemodialysis) 09/11/14 1128 09/11/14 1755   09/11/14 1121  vancomycin (VANCOCIN) 500 mg in sodium chloride 0.9 % 100 mL IVPB  Status:  Discontinued     500 mg100 mL/hr over 60 Minutes Intravenous Every Dialysis 09/11/14 1122 09/11/14 1123   09/10/14 1400  clindamycin (CLEOCIN) IVPB 600 mg      600 mg100 mL/hr over 30 Minutes Intravenous 3 times per day 09/10/14 1047 09/11/14 2359   09/10/14 1200  vancomycin (VANCOCIN) IVPB 1000 mg/200 mL premix     1,000 mg200 mL/hr over 60 Minutes Intravenous  Once 09/10/14 1035 09/10/14 1215   09/10/14 1200  piperacillin-tazobactam (ZOSYN) IVPB 2.25 g  Status:  Discontinued     2.25 g100 mL/hr over 30 Minutes Intravenous Every 8 hours 09/10/14 1035 09/14/14 1108      DVT Prophylaxis: SCD's  Code Status: Full code   Family Communication None at bedside- patient is awake/alert and understanding of above plan.  Procedures:  None  CONSULTS:  nephrology   MEDICATIONS: Scheduled Meds: . amLODipine  5 mg Oral Daily  . atorvastatin  40 mg Oral q1800  . bacitracin   Topical BID  . calcitRIOL  2.75 mcg Oral Q T,Th,Sa-HD  . calcium acetate  667 mg Oral TID WC  . carvedilol  6.25 mg Oral BID WC  . cephALEXin  500 mg Oral Daily  . cinacalcet  60 mg Oral Q breakfast  . colchicine  0.3 mg Oral Once per day on Mon Thu  . [START ON 09/23/2014] darbepoetin (ARANESP) injection - DIALYSIS  60 mcg Intravenous Q Sat-HD  . doxycycline  100 mg Oral Q12H  . ferric gluconate (FERRLECIT/NULECIT) IV  125 mg Intravenous Q T,Th,Sa-HD  . pantoprazole  40 mg Oral BID  . predniSONE  5 mg Oral Daily  . sodium  chloride  3 mL Intravenous Q12H   Continuous Infusions:   PRN Meds:.acetaminophen, acetaminophen, metoCLOPramide, ondansetron **OR** ondansetron (ZOFRAN) IV, oxyCODONE    PHYSICAL EXAM: Vital signs in last 24 hours: Filed Vitals:   09/19/14 2015 09/19/14 2134 09/20/14 0624 09/20/14 0917  BP: 110/41 115/31 157/25 114/47  Pulse: 91 86 83 84  Temp:  99.1 F (37.3 C) 98.2 F (36.8 C) 98.3 F (36.8 C)  TempSrc:  Oral Oral Oral  Resp:  17 18 17   Height:      Weight:  45.6 kg (100 lb 8.5 oz)    SpO2:  97% 98% 99%    Weight change: -1 kg (-2 lb 3.3 oz) Filed Weights   09/19/14 1210 09/19/14 1543 09/19/14 2134  Weight: 46.2 kg (101 lb 13.6  oz) 45.8 kg (100 lb 15.5 oz) 45.6 kg (100 lb 8.5 oz)   Body mass index is 13.63 kg/(m^2).   Gen Exam: Awake and alert with clear speech.   Neck: Supple, No JVD.   Chest: B/L Clear.   CVS: No edema Abdomen: non tender, non distended.  Extremities: no edema, lower extremities warm to touch.Foul smelling ulcer in left foot with purulent drainage.Dry gangrene in several toes in right foot Neurologic: Non Focal.   Skin: No Rash.   Wounds: see below     Right Foot with dry gangrene (below)   Left great toe (below)   Intake/Output from previous day:  Intake/Output Summary (Last 24 hours) at 09/20/14 1234 Last data filed at 09/20/14 0923  Gross per 24 hour  Intake    480 ml  Output    747 ml  Net   -267 ml     LAB RESULTS: CBC  Recent Labs Lab 09/17/14 1044 09/18/14 0457 09/19/14 0218 09/19/14 1221 09/20/14 0552  WBC 9.2 10.3 16.9* 10.2 13.1*  HGB 9.7* 9.0* 9.5* 8.0* 8.0*  HCT 30.0* 28.4* 30.0* 24.5* 24.9*  PLT 144* 169 277 188 175  MCV 86.0 86.6 87.0 88.4 90.5  MCH 27.8 27.4 27.5 28.9 29.1  MCHC 32.3 31.7 31.7 32.7 32.1  RDW 15.6* 15.8* 16.3* 16.4* 17.1*    Chemistries   Recent Labs Lab 09/16/14 1324 09/19/14 1222  NA 139 137  K 4.3 3.8  CL 105 101  CO2 25 25  GLUCOSE 90 105*  BUN 21 17  CREATININE 7.52* 8.39*  CALCIUM 6.9* 7.2*    CBG: No results for input(s): GLUCAP in the last 168 hours.  GFR Estimated Creatinine Clearance: 6.4 mL/min (by C-G formula based on Cr of 8.39).  Coagulation profile No results for input(s): INR, PROTIME in the last 168 hours.  Cardiac Enzymes No results for input(s): CKMB, TROPONINI, MYOGLOBIN in the last 168 hours.  Invalid input(s): CK  Invalid input(s): POCBNP No results for input(s): DDIMER in the last 72 hours. No results for input(s): HGBA1C in the last 72 hours. No results for input(s): CHOL, HDL, LDLCALC, TRIG, CHOLHDL, LDLDIRECT in the last 72 hours. No results for input(s): TSH, T4TOTAL, T3FREE,  THYROIDAB in the last 72 hours.  Invalid input(s): FREET3 No results for input(s): VITAMINB12, FOLATE, FERRITIN, TIBC, IRON, RETICCTPCT in the last 72 hours. No results for input(s): LIPASE, AMYLASE in the last 72 hours.  Urine Studies No results for input(s): UHGB, CRYS in the last 72 hours.  Invalid input(s): UACOL, UAPR, USPG, UPH, UTP, UGL, UKET, UBIL, UNIT, UROB, ULEU, UEPI, UWBC, URBC, UBAC, CAST, UCOM, BILUA  MICROBIOLOGY: Recent Results (from the past 240 hour(s))  Surgical  pcr screen     Status: None   Collection Time: 09/12/14  2:36 PM  Result Value Ref Range Status   MRSA, PCR NEGATIVE NEGATIVE Final   Staphylococcus aureus NEGATIVE NEGATIVE Final    Comment:        The Xpert SA Assay (FDA approved for NASAL specimens in patients over 39 years of age), is one component of a comprehensive surveillance program.  Test performance has been validated by [employer] for patients greater than or equal to 47 year old. It is not intended to diagnose infection nor to guide or monitor treatment.     RADIOLOGY STUDIES/RESULTS: Dg Chest 2 View  09/10/2014   CLINICAL DATA:  Leukocytosis  EXAM: CHEST  2 VIEW  COMPARISON:  08/29/2014  FINDINGS: Bilateral mild apical pleural calcifications. Left apical calcifications which are new compared with prior chest x-ray dated 07/01/2012. There is a small-moderate left pleural effusion. There is no right pleural effusion. There is no pneumothorax. The cardiomediastinal silhouette is stable. No acute osseous abnormality.  IMPRESSION: 1. Small either moderate left pleural effusion. Left apical calcifications again noted which are new since the prior exam of 07/01/2012. These are likely postinflammatory but can be seen in the setting of tuberculosis. Correlate with PPD or QuantiFERON test.   Electronically Signed   By: Kathreen Devoid   On: 09/10/2014 10:35   Ct Abdomen Pelvis W Contrast  08/27/2014   CLINICAL DATA:  Acute onset of bright red  blood per rectum, with periumbilical cramping abdominal pain. Initial encounter.  EXAM: CT ABDOMEN AND PELVIS WITH CONTRAST  TECHNIQUE: Multidetector CT imaging of the abdomen and pelvis was performed using the standard protocol following bolus administration of intravenous contrast.  CONTRAST:  168mL OMNIPAQUE IOHEXOL 300 MG/ML  SOLN  COMPARISON:  CT of the abdomen and pelvis from 11/10/2012  FINDINGS: Trace bilateral pleural effusions are seen, with bibasilar atelectasis.  A nonspecific 6 mm hypodensity within the left hepatic lobe is of uncertain significance. The liver is otherwise unremarkable. The spleen is mildly bulky, but remains normal in length. There is persistent chronic gallbladder wall thickening. The gallbladder is difficult to fully assess given trace ascites tracking about the liver.  The pancreas and adrenal glands are unremarkable.  The patient's native kidneys are markedly atrophic, with scattered bilateral renal cysts of varying size and attenuation. Scattered associated calcifications are seen. There is no evidence of hydronephrosis. No obstructing renal stones are seen.  There is marked fatty infiltration with regard to the transplant kidney at the left iliac fossa, with marked thinning of the renal parenchyma. Diffuse associated vascular calcifications are seen. This appearance is stable from 2014.  There is mild diffuse mesenteric and omental edema, nonspecific in appearance.  The stomach is within normal limits. No acute vascular abnormalities are seen. Diffuse calcification is seen along the abdominal aorta and its branches. Extensive diffuse vascular calcification is noted throughout the abdomen and pelvis.  The patient appears to be status post resection of much of the bowel. Remaining small bowel is grossly unremarkable. A distended anastomosis is noted at the right hemipelvis; this corresponds to the site of the prior right pelvic catheter, which has been removed.  The appendix is not  definitely characterized; there is no evidence for appendicitis. Contrast progresses to the level of the proximal sigmoid colon. There is scattered diverticulosis along the transverse colon. Stool is noted filling the distal sigmoid colon and rectum.  There is soft tissue inflammation about the distal sigmoid colon  and rectum, raising question for proctitis. Diffuse presacral stranding is also noted, new from the prior study.  The bladder is not well assessed. Soft tissue density about the expected location of the bladder is relatively stable in appearance. 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 This is perhaps slightly more prominent than on the prior study. No inguinal lymphadenopathy is seen.  No acute osseous abnormalities are identified.  IMPRESSION: 1. Soft tissue inflammation about the distal sigmoid colon and rectum, with diffuse presacral stranding, new from the prior study. This raises concern for proctitis, which may explain the patient's bright red blood per rectum. Stool noted filling the distal sigmoid colon and rectum. 2. Scattered diverticulosis along the transverse colon, without evidence of diverticulitis; the colon is otherwise grossly unremarkable. 3. Trace bilateral pleural effusions, with bibasilar atelectasis. 4. Marked atrophy of the patient's bilateral native kidneys, with bilateral renal cysts and scattered calcifications. Marked fatty infiltration of the transplant kidney at the left iliac fossa, with marked thinning of the renal parenchyma. This appearance is stable from 2014. 5. Stable appearance to soft tissue density about the expected location of the bladder. 6. 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 7. Diffuse calcification along the abdominal aorta and its  branches. Extensive diffuse vascular calcification seen throughout the abdomen and pelvis. 8. Nonspecific 6 mm hypodensity within the left hepatic lobe, new from prior studies. 9. Persistent chronic gallbladder wall thickening; gallbladder difficult to fully assess given trace ascites about the liver, but this appearance is relatively stable. 10. Mild nonspecific diffuse mesenteric and omental edema.   Electronically Signed   By: Garald Balding M.D.   On: 08/27/2014 01:30   Dg Chest Port 1 View  08/29/2014   CLINICAL DATA:  Central line placement.  EXAM: PORTABLE CHEST - 1 VIEW  COMPARISON:  07/03/2012  FINDINGS: Interval placement of a left central venous catheter. Tip projects to the left mediastinum over the aortic arch. This could be in the brachiocephalic vein, in a normal variant left superior vena cava, or an arterial branch. No pneumothorax. Calcification in the left apex is likely postinflammatory. This is new since previous study. Heart size is normal. Hemidiaphragms are not included within the field of view.  IMPRESSION: Nonspecific placement of left central venous catheter to the left of midline, suggesting placement either in the brachiocephalic vein, left superior vena cava, or arterial branch. No pneumothorax.   Electronically Signed   By: Lucienne Capers M.D.   On: 08/29/2014 02:17   Dg Abd Portable 1v  08/26/2014   CLINICAL DATA:  Acute GI bleed  EXAM: PORTABLE ABDOMEN - 1 VIEW  COMPARISON:  CT abdomen pelvis dated 11/10/2012  FINDINGS: Nonspecific but nonobstructive bowel gas pattern.  Surgical clips overlying the pelvis.  Vascular calcifications.  IMPRESSION: Unremarkable abdominal radiograph.   Electronically Signed   By: Julian Hy M.D.   On: 08/26/2014 22:30   Dg Toe Great Left  09/10/2014   ADDENDUM REPORT: 09/10/2014 10:44  ADDENDUM: These results were called by telephone at the time of interpretation on 09/10/2014 at 10:44 am to Dr. Oren Binet , who verbally  acknowledged these results.   Electronically Signed   By: Lowella Grip M.D.   On: 09/10/2014 10:44   09/10/2014   CLINICAL DATA:  Loss of blood flow to first  toe ; pain  EXAM: LEFT FIRST TOE  COMPARISON:  None.  FINDINGS: Frontal, oblique, and lateral views were obtained. There is soft tissue swelling along the dorsal distal aspect of the first digit. There are scattered foci of air in the soft tissues, concerning for potential soft tissue infection in this area. There is no erosive change or bony destruction. No fracture or dislocation. Joint spaces appear intact. There is extensive arterial vascular calcification.  IMPRESSION: Soft tissue air is noted in the distal first digit. This finding is concerning for potential infection. Early gas gangrene could present in this manner and must be of concern. No bony destruction. No fracture or dislocation. Extensive arterial vascular calcification. Suspect diabetes mellitus.  Electronically Signed: By: Lowella Grip M.D. On: 09/10/2014 10:39    Debbe Odea, MD  [facility] If 7PM-7AM, please contact night-coverage [email or web address] Password [email or web address] 09/20/2014, 12:34 PM   LOS: 12 days

## 2014-09-20 NOTE — Progress Notes (Signed)
Physical Therapy Treatment Patient Details Name: [name] MRN: [id number] DOB: 05-Jan-1961 Today's Date: 09/20/2014    History of Present Illness Patient is a 53 yo male admitted 09/08/14 with recurrent GIB.  Was d/c from hospital on 09/05/14 with similar issue.  PMH:  severe PVD with multiple revascularization procedures and dry gangrene Rt foot, ESRD on HD, renal transplant, HTN, CHF, Hep C, anemia, CAD, PCI 01/13/14.Marland Kitchen  Underwent amputation of Left great toe on 09/12/14.    PT Comments    Pt progressing towards physical therapy goals. Pt continues to fatigue quickly and chair follow was utilized this session. Pt required seated rest beak after ~10 feet and was able to ambulate 5 feet more before he was too fatigued to continue. If tolerance for functional activity does not improve, may need to look at short term rehab at d/c prior to returning home.   Follow Up Recommendations  Home health PT;Supervision - Intermittent     Equipment Recommendations  None recommended by PT    Recommendations for Other Services       Precautions / Restrictions Precautions Precautions: Fall Precaution Comments: [employer] shoes for bilat feet to off weight forefoot. Fatigues easily Required Braces or Orthoses: Other Brace/Splint Restrictions Weight Bearing Restrictions: No    Mobility  Bed Mobility Overal bed mobility: Modified Independent Bed Mobility: Supine to Sit           General bed mobility comments: Railing and increased time required to achieve full sitting EOB.   Transfers Overall transfer level: Needs assistance Equipment used: Rolling walker (2 wheeled) Transfers: Sit to/from Stand Sit to Stand: Supervision         General transfer comment: Pt able to power-up to full standing without assistance. RW and supervision for safety.   Ambulation/Gait Ambulation/Gait assistance: Min guard Ambulation Distance (Feet): 15 Feet Assistive device: Rolling walker (2 wheeled) Gait  Pattern/deviations: Step-through pattern;Decreased stride length;Trunk flexed;Narrow base of support Gait velocity: Decreased Gait velocity interpretation: Below normal speed for age/gender General Gait Details: Pt required increased time and seated rest break to achieve 15' of ambulation. Pt declines ambulating out into the hall due to fatigue. Balance effected from [facility] shoes bilaterally.    Stairs            Wheelchair Mobility    Modified Rankin (Stroke Patients Only)       Balance Overall balance assessment: Needs assistance Sitting-balance support: Feet supported;No upper extremity supported Sitting balance-Leahy Scale: Good     Standing balance support: Bilateral upper extremity supported Standing balance-Leahy Scale: Poor Standing balance comment: Pt requires UE support or assist to maintain standing balance with [employer] shoes donned.                     Cognition Arousal/Alertness: Awake/alert Behavior During Therapy: WFL for tasks assessed/performed Overall Cognitive Status: Within Functional Limits for tasks assessed                      Exercises      General Comments        Pertinent Vitals/Pain Pain Assessment: 0-10 Pain Score: 8  Pain Location: Ulcer on penis - pt declined pain medication prior to session Pain Intervention(s): Limited activity within patient's tolerance;Monitored during session    Home Living                      Prior Function  PT Goals (current goals can now be found in the care plan section) Acute Rehab PT Goals Patient Stated Goal: to walk without pain PT Goal Formulation: With patient Time For Goal Achievement: 09/28/14 Potential to Achieve Goals: Good Progress towards PT goals: Progressing toward goals    Frequency  Min 3X/week    PT Plan Current plan remains appropriate    Co-evaluation             End of Session Equipment Utilized During Treatment: Gait belt Activity  Tolerance: Patient limited by fatigue Patient left: in chair;with call bell/phone within reach     Time: 0914-0940 PT Time Calculation (min) (ACUTE ONLY): 26 min  Charges:  $Gait Training: 8-22 mins $Therapeutic Activity: 8-22 mins                    G Codes:      Rolinda Roan 10/18/14, 10:43 AM   Rolinda Roan, PT, DPT Acute Rehabilitation Services Pager: [phone]

## 2014-09-20 NOTE — Progress Notes (Signed)
Subjective:  Eating breakfast, no new cos, denies any more bloody stool, cramping end of hd yesterday  Objective Vital signs in last 24 hours: Filed Vitals:   09/19/14 1543 09/19/14 2015 09/19/14 2134 09/20/14 0624  BP: 97/49 110/41 115/31 157/25  Pulse: 92 91 86 83  Temp: 97.6 F (36.4 C)  99.1 F (37.3 C) 98.2 F (36.8 C)  TempSrc:   Oral Oral  Resp: 20  17 18   Height:      Weight: 45.8 kg (100 lb 15.5 oz)  45.6 kg (100 lb 8.5 oz)   SpO2: 99%  97% 98%   Weight change: -1 kg (-2 lb 3.3 oz)  Physical Exam: General : Alert, NAD, appropriate Card- RRR no rub, gallop or mur Resp: CTA bilat Abd: BS +, Soft, NT, ND Ext: No lower extremity edema-R dry gangrenous  toes. Left leg in clean dressing L thigh AVGG pos. bruit  Dialysis Orders: TTS @ [facility]  3.5h 46.5 kg 2K/2Ca 400/800 Profile 2 Heparin none L thigh AVG  Calcitriol 2.75 mcg Aranesp 60 mcg on Thurs Venofer 50 per week Recent labs from 12/17 Hgb 11.3 Fe 22, 27% sat K 3.9 Ca 9.4 P 3 Alb 2.9 iPTH 217 WBC 11.56  Problem/Plan: 1. Recurrent GIB with ABLA: No further hematochezia Colonoscopy 12/21 showed pandiverticulosis (thought to have been source of GIB) per Dr. Deatra Ina. hgb 8.0 yesterday and this am/ Plavix has been dc for good/no hep hd 2. ESRD - TTS HD ( [place]) 3. Hypertension/volume - BP stable 157/25 , Bp on low side yest. Amlodipine 10mg  decr 5mg  hds/ on Coreg 6.25mg  bid /He Is below edw, no vol excess on exam/no uf  With hd in am 4. Anemia -am hgb 8.0 s/p PRBCs/  Aranesp ^ 150 yesterday/ Fe lolading 5. Metabolic bone disease - controlled with current calcitiriol/ sensipar/binders dose, phos 3.4/  Ca corrected 9.2 /decr binder 1ac was 3   6. Protein calorie malnutrition - Alb 1.5  persistently low albumin-likely indicative of his chronic inflammatory process from nonhealing wounds and the impact of recent surgery. and Hep C/ this am eating about 100% add breeze  supplement 7. Severe PVD - recent R fem pop, prior LLE perc revasc this year as well/ [facility] seeing 8. S/P L great toe amp - 12/22 for osteomyelitis/ on Keflex and Doxycyline po 9. Chronic Hep C + state-  10. Chronic corticosteroid use - for gout 11. PVD With Multiple wounds - ulcer tip of penis- local care,(Urolo. Noted was consulted) right foot toes/distal foot dry gangrene = [facility] seeing  Ernest Haber, PA-C [place] [phone] 09/20/2014,8:42 AM  LOS: 12 days   Pt seen, examined and agree w A/P as above.  [name] MD pager [phone]    cell [phone] 09/20/2014, 11:26 AM    Labs: Basic Metabolic Panel:  Recent Labs Lab 09/16/14 1324 09/19/14 1222  NA 139 137  K 4.3 3.8  CL 105 101  CO2 25 25  GLUCOSE 90 105*  BUN 21 17  CREATININE 7.52* 8.39*  CALCIUM 6.9* 7.2*  PHOS 4.2 3.4   Liver Function Tests:  Recent Labs Lab 09/16/14 1324 09/19/14 1222  ALBUMIN 1.4* 1.5*  CBC:  Recent Labs Lab 09/17/14 1044 09/18/14 0457 09/19/14 0218 09/19/14 1221 09/20/14 0552  WBC 9.2 10.3 16.9* 10.2 13.1*  HGB 9.7* 9.0* 9.5* 8.0* 8.0*  HCT 30.0* 28.4* 30.0* 24.5* 24.9*  MCV 86.0 86.6 87.0 88.4 90.5  PLT 144* 169 277 188 175  Medications:   . amLODipine  10 mg Oral Daily  . atorvastatin  40 mg Oral q1800  . bacitracin   Topical BID  . calcitRIOL  2.75 mcg Oral Q T,Th,Sa-HD  . calcium acetate  2,001 mg Oral TID WC  . carvedilol  6.25 mg Oral BID WC  . cephALEXin  500 mg Oral Daily  . cinacalcet  60 mg Oral Q breakfast  . colchicine  0.3 mg Oral Once per day on Mon Thu  . [START ON 09/23/2014] darbepoetin (ARANESP) injection - DIALYSIS  60 mcg Intravenous Q Sat-HD  . doxycycline  100 mg Oral Q12H  . ferric gluconate (FERRLECIT/NULECIT) IV  125 mg Intravenous Q T,Th,Sa-HD  . pantoprazole  40 mg Oral BID  . predniSONE  5 mg Oral Daily  . sodium chloride  3 mL Intravenous Q12H

## 2014-09-20 NOTE — Progress Notes (Signed)
[facility]   Subjective  - no complaints   Objective 114/47 84 98.3 F (36.8 C) (Oral) 17 99%  Intake/Output Summary (Last 24 hours) at 09/20/14 1225 Last data filed at 09/20/14 2060  Gross per 24 hour  Intake    480 ml  Output    747 ml  Net   -267 ml   Right foot stable dry gangrene Left toe amp site dark edges but no drainage so far  Assessment/Planning: Marginal appearing toe amp site left foot Right foot stable gangrene Watchful waiting for now Continue dry dressing left foot  Halla Chopp E 09/20/2014 12:25 PM --  Laboratory Lab Results:  Recent Labs  09/19/14 1221 09/20/14 0552  WBC 10.2 13.1*  HGB 8.0* 8.0*  HCT 24.5* 24.9*  PLT 188 175   BMET  Recent Labs  09/19/14 1222  NA 137  K 3.8  CL 101  CO2 25  GLUCOSE 105*  BUN 17  CREATININE 8.39*  CALCIUM 7.2*    COAG Lab Results  Component Value Date   INR 1.22 09/09/2014   INR 1.16 09/08/2014   INR 1.19 08/29/2014   No results found for: PTT

## 2014-09-21 ENCOUNTER — Encounter (HOSPITAL_COMMUNITY)
Admission: EM | Disposition: A | Discharge status: Home Health | Payer: Self-pay | Source: Home / Self Care | Attending: Family Medicine

## 2014-09-21 ENCOUNTER — Telehealth: Payer: Self-pay | Admitting: Surgery

## 2014-09-21 ENCOUNTER — Encounter (HOSPITAL_COMMUNITY): Payer: Self-pay

## 2014-09-21 DIAGNOSIS — K5793 Diverticulitis of intestine, part unspecified, without perforation or abscess with bleeding: Secondary | ICD-10-CM

## 2014-09-21 DIAGNOSIS — D62 Acute posthemorrhagic anemia: Secondary | ICD-10-CM

## 2014-09-21 HISTORY — PX: FLEXIBLE SIGMOIDOSCOPY: SHX5431

## 2014-09-21 LAB — CBC
HEMATOCRIT: 24.6 % — AB (ref 39.0–52.0)
Hemoglobin: 7.7 g/dL — ABNORMAL LOW (ref 13.0–17.0)
MCH: 28.6 pg (ref 26.0–34.0)
MCHC: 31.3 g/dL (ref 30.0–36.0)
MCV: 91.4 fL (ref 78.0–100.0)
Platelets: 215 10*3/uL (ref 150–400)
RBC: 2.69 MIL/uL — AB (ref 4.22–5.81)
RDW: 17.9 % — ABNORMAL HIGH (ref 11.5–15.5)
WBC: 12.3 10*3/uL — AB (ref 4.0–10.5)

## 2014-09-21 LAB — BASIC METABOLIC PANEL
Anion gap: 7 (ref 5–15)
BUN: 12 mg/dL (ref 6–23)
CHLORIDE: 102 meq/L (ref 96–112)
CO2: 28 mmol/L (ref 19–32)
CREATININE: 6.66 mg/dL — AB (ref 0.50–1.35)
Calcium: 7.7 mg/dL — ABNORMAL LOW (ref 8.4–10.5)
GFR calc Af Amer: 10 mL/min — ABNORMAL LOW (ref >90)
GFR calc non Af Amer: 8 mL/min — ABNORMAL LOW (ref >90)
Glucose, Bld: 82 mg/dL (ref 70–99)
POTASSIUM: 5.1 mmol/L (ref 3.5–5.1)
Sodium: 137 mmol/L (ref 135–145)

## 2014-09-21 SURGERY — SIGMOIDOSCOPY, FLEXIBLE
Anesthesia: Moderate Sedation

## 2014-09-21 MED ORDER — OXYCODONE HCL 5 MG PO TABS
ORAL_TABLET | ORAL | Status: AC
  Filled 2014-09-21: qty 2

## 2014-09-21 MED ORDER — DARBEPOETIN ALFA 150 MCG/0.3ML IJ SOSY
PREFILLED_SYRINGE | INTRAMUSCULAR | Status: AC
  Administered 2014-09-21: 150 ug via INTRAVENOUS
  Filled 2014-09-21: qty 0.3

## 2014-09-21 MED ORDER — FENTANYL CITRATE 0.05 MG/ML IJ SOLN
INTRAMUSCULAR | Status: DC | PRN
  Administered 2014-09-21 (×2): 12.5 ug via INTRAVENOUS
  Administered 2014-09-21: 25 ug via INTRAVENOUS

## 2014-09-21 MED ORDER — ASPIRIN EC 81 MG PO TBEC: 81.0000 mg | DELAYED_RELEASE_TABLET | Freq: Every day | ORAL | Status: DC

## 2014-09-21 MED ORDER — MIDAZOLAM HCL 5 MG/ML IJ SOLN
INTRAMUSCULAR | Status: AC
  Filled 2014-09-21: qty 2

## 2014-09-21 MED ORDER — DARBEPOETIN ALFA 100 MCG/0.5ML IJ SOSY: 100.0000 ug | PREFILLED_SYRINGE | Freq: Once | INTRAMUSCULAR | Status: DC

## 2014-09-21 MED ORDER — PENTAFLUOROPROP-TETRAFLUOROETH EX AERO
INHALATION_SPRAY | CUTANEOUS | Status: AC
  Filled 2014-09-21: qty 103.5

## 2014-09-21 MED ORDER — DARBEPOETIN ALFA 150 MCG/0.3ML IJ SOSY
150.0000 ug | PREFILLED_SYRINGE | Freq: Once | INTRAMUSCULAR | Status: AC
  Administered 2014-09-21: 150 ug via INTRAVENOUS

## 2014-09-21 MED ORDER — BOOST / RESOURCE BREEZE PO LIQD: 1.0000 | Freq: Three times a day (TID) | ORAL | Status: DC

## 2014-09-21 MED ORDER — SODIUM CHLORIDE 0.9 % IV SOLN
INTRAVENOUS | Status: DC
Start: 2014-09-21 — End: 2014-09-21
  Administered 2014-09-21: 14:00:00 via INTRAVENOUS

## 2014-09-21 MED ORDER — RENA-VITE PO TABS: 1.0000 | ORAL_TABLET | Freq: Every day | ORAL | Status: DC

## 2014-09-21 MED ORDER — FENTANYL CITRATE 0.05 MG/ML IJ SOLN
INTRAMUSCULAR | Status: AC
  Filled 2014-09-21: qty 2

## 2014-09-21 MED ORDER — DARBEPOETIN ALFA 150 MCG/0.3ML IJ SOSY: 150.0000 ug | PREFILLED_SYRINGE | INTRAMUSCULAR | Status: DC

## 2014-09-21 MED ORDER — MIDAZOLAM HCL 10 MG/2ML IJ SOLN
INTRAMUSCULAR | Status: DC | PRN
Start: 2014-09-21 — End: 2014-09-21
  Administered 2014-09-21: 1 mg via INTRAVENOUS
  Administered 2014-09-21: 2 mg via INTRAVENOUS
  Administered 2014-09-21: 1 mg via INTRAVENOUS

## 2014-09-21 NOTE — Progress Notes (Signed)
     [facility] Progress Note  Subjective:   Pt is a 53 yo AA male with CKD on HD, vascular disease, HTN, CAD s/p DES April 2015, on chronic steroids for gout, who presented to ER 08/16/14 with right leg pain, and subsequently underwent right femoral to below knee popliteal artery bypass on 11/27. He developed left leg swelling, with concerns for  Stenosis related to his graft. ON 12/3,  He underwent left thigh shuntogram with subsequent venoplasty and stenting of the left common femoral vein. Left foot swelling improved.  ON 12/5, pt developed hematochezia, developed homorrhagic shock and required ICU transfer. He received a total of 11 units PRBCs, 4 units of FFP, and required pressors to maintain BP. Aspirin and plavix were held.  He stabilized and hematochezia resolved. On 12/11, he started to have brown bowel movements. Colonoscopy was planned, but pt refused as his bleeding had resolved. Aspirin and plavix were restarted and pt was discharged home on 09/05/14. ON 12/18, he had hematochezia, presented back to ER and was admitted. He had a colonoscopy by Dr Deatra Ina on 12/21 which showed pan diverticulosis.This hospitalization has been complicated by infection of left great toe s/p toe amputation 12/22. Pt was restarted on plavix and developed GI bleed again. Today, pt says his last bloody BM was Sunday Pm, but progress notes indicate it was the pm of 28th. Hgb on 12/18 was 12.3. This morning 7.7. Pt denies abd pain, N/V. Asked to see pt again to help decide when plavix can be restarted. Pt worried he may bleed again.   Objective:  Vital signs in last 24 hours: Temp:  [97.6 F (36.4 C)-97.9 F (36.6 C)] 97.9 F (36.6 C) (12/31 0440) Pulse Rate:  [63-79] 79 (12/31 0924) Resp:  [18-20] 19 (12/31 0654) BP: (92-158)/(28-73) 139/61 mmHg (12/31 0924) SpO2:  [97 %-99 %] 99 % (12/31 0654) Weight:  [99 lb 10.4 oz (45.2 kg)] 99 lb 10.4 oz (45.2 kg) (12/31 0654) Last BM Date:  09/19/14 General:   Alert,  Well-developed, AA mal  in NAD inHD Heart:  Regular rate and rhythm; no murmurs Pulm;lungs clear Abdomen:  Soft, nontender and nondistended. Normal bowel sounds, without guarding, and without rebound.   Extremities:  Without edema.  Neurologic:  Alert and  oriented x4;  grossly normal neurologically. Psych:  Alert and cooperative. Normal mood and affect.    Lab Results:  Recent Labs  09/19/14 1221 09/20/14 0552 09/21/14 0700  WBC 10.2 13.1* 12.3*  HGB 8.0* 8.0* 7.7*  HCT 24.5* 24.9* 24.6*  PLT 188 175 215    ASSESSMENT/PLAN:   53 yo male with CAD with DES April 2015, with history recent ischemic right leg, s/p recent  Lower GI bleed which was presumably diverticular, with continue  Blood loss anamia likely due to GI bleed. Hgb has trended down to 8.6 from 12.3. Will review with Dr Carlean Purl re: restartin plavix.    LOS: 13 days   Hvozdovic, Deloris Ping 09/21/2014, Pager [phone]  Addendum_ Reviewed pt with Dr Carlean Purl. Will plan on unprepped flex sig this afternoon. Pt aware and in agreement. [facility] Attending  I have also seen and assessed the patient and agree with the above note.  Gatha Mayer, MD, [facility] [phone] (pager) 09/21/2014 2:11 PM

## 2014-09-21 NOTE — Progress Notes (Signed)
[facility] Progress Note  Assessment/Plan: 1. Recurrent GIB with ABLA: No further hematochezia Colonoscopy 12/21 showed pandiverticulosis (thought to have been source of GIB) per Dr. Deatra Ina. hgb 8.0  To 7.7 plavix has been dc for good/no hep hd 2. ESRD - TTS HD ( [place]) K 5.1 - NEXT HD due Sunday per holiday schedule 3. Hypertension/volume - BP 130 - 150 . Amlodipine 10mg  decr 5mg  hds/ on Coreg 6.25mg  bid; keeping even on HD today; would not surprise me that he is below edw in hospital 4. Anemia -am hgb 7.7s/p PRBCs/Aranesp 60 last given 12/26 - needs to be increased - will give additional 150  Today/ repleting Fe 5. Metabolic bone disease - controlled with current calcitiriol/ sensipar/binders dose, phos 3.4/ Ca corrected 9.2 /decr binder 1ac was 3  6. Protein calorie malnutrition - Alb 1.5 persistently low albumin-likely indicative of his chronic inflammatory process from nonhealing wounds and the impact of recent surgery. and Hep C/ this am eating about 100% add breeze supplement 7. Severe PVD - recent R fem pop, prior LLE perc revasc this year as well/ [facility] seeing 8. S/P L great toe amp - 12/22 for osteomyelitis/ Doxycyline po 9. Chronic Hep C + state-  10. Chronic corticosteroid use - for gout 11. PVD With Multiple wounds - ulcer tip of penis- local care,(Urolo. Noted was consulted) right foot toes/distal foot dry gangrene = [facility] seeing, I think he needs amputation of his R foot 12. Disp - not sure of end point; he doesn't feel like he can go home today  [name], PA-C [place] [phone] 09/21/2014,9:41 AM  LOS: 13 days   Pt seen, examined and agree w A/P as above.  [name] MD pager [phone]    cell [phone] 09/21/2014, 11:08 AM    Subjective:   C/o feeling cold; "I can't go home if I feel this cold"  Objective Filed Vitals:   09/21/14 0800 09/21/14 0827 09/21/14 0852 09/21/14 0924  BP: 130/28 145/65 153/61  139/61  Pulse: 74 74 78 79  Temp:      TempSrc:      Resp:      Height:      Weight:      SpO2:       Physical Exam temp recheck 97.7 General: emaciated, bundled up in blankets Heart: RRR Lungs: no rales, clear  Abdomen: soft NT Extremities: no LE edema; left foot wrapped; right foot in sock no examined Dialysis Access: left thigh AVGG  Dialysis Orders: TTS @ [facility]  3.5h 46.5 kg 2K/2Ca 400/800 Profile 2 Heparin none L thigh AVG  Calcitriol 2.75 mcg Aranesp 60 mcg on Thurs Venofer 50 per week Recent labs from 12/17 Hgb 11.3 Fe 22, 27% sat K 3.9 Ca 9.4 P 3 Alb 2.9 iPTH 217 WBC 11. Additional Objective Labs: Basic Metabolic Panel:  Recent Labs Lab 09/16/14 1324 09/19/14 1222 09/21/14 0700  NA 139 137 137  K 4.3 3.8 5.1  CL 105 101 102  CO2 25 25 28   GLUCOSE 90 105* 82  BUN 21 17 12   CREATININE 7.52* 8.39* 6.66*  CALCIUM 6.9* 7.2* 7.7*  PHOS 4.2 3.4  --    Liver Function Tests:  Recent Labs Lab 09/16/14 1324 09/19/14 1222  ALBUMIN 1.4* 1.5*   CBC:  Recent Labs Lab 09/18/14 0457 09/19/14 0218 09/19/14 1221 09/20/14 0552 09/21/14 0700  WBC 10.3 16.9* 10.2 13.1* 12.3*  HGB 9.0* 9.5* 8.0* 8.0* 7.7*  HCT 28.4* 30.0*  24.5* 24.9* 24.6*  MCV 86.6 87.0 88.4 90.5 91.4  PLT 169 277 188 175 215   Blood Culture    Component Value Date/Time   SDES BLOOD LEFT WRIST 09/10/2014 1230   SPECREQUEST BOTTLES DRAWN AEROBIC ONLY 10CC 09/10/2014 1230   CULT  09/10/2014 1230    NO GROWTH 5 DAYS Performed at [facility] 09/16/2014 FINAL 09/10/2014 1230   Medications:   . amLODipine  5 mg Oral Daily  . atorvastatin  40 mg Oral q1800  . bacitracin   Topical BID  . calcitRIOL  2.75 mcg Oral Q T,Th,Sa-HD  . calcium acetate  667 mg Oral TID WC  . carvedilol  6.25 mg Oral BID WC  . cinacalcet  60 mg Oral Q breakfast  . colchicine  0.3 mg Oral Once per day on Mon Thu  . [START ON 09/23/2014] darbepoetin  (ARANESP) injection - DIALYSIS  60 mcg Intravenous Q Sat-HD  . doxycycline  100 mg Oral Q12H  . ferric gluconate (FERRLECIT/NULECIT) IV  125 mg Intravenous Q T,Th,Sa-HD  . pantoprazole  40 mg Oral BID  . pentafluoroprop-tetrafluoroeth      . predniSONE  5 mg Oral Daily  . sodium chloride  3 mL Intravenous Q12H

## 2014-09-21 NOTE — Discharge Instructions (Signed)
Follow up with PCP in 2 weeks, to see if you can resume plavix and aspirin.

## 2014-09-21 NOTE — Discharge Summary (Addendum)
Physician Discharge Summary  [name] BMW:[id number] DOB: 1961-02-17 DOA: 09/08/2014  PCP: Placido Sou, MD  Admit date: 09/08/2014 Discharge date: 09/21/2014  Time spent: 55 minutes   Recommendations for Outpatient Follow-up:  1. F/u Hemoglobin and transfuse as needed during dialysis 2. ASA and Plavix to be resumed in 2 wks 3. F/u with Dr Trula Slade in 2 wks 4. HHPT ordered  Discharge Condition: stable Diet recommendation: renal diet  Discharge Diagnoses:  Principal Problem:   Acute post-hemorrhagic anemia Active Problems:   Diverticulosis of colon with hemorrhage   ESRD on hemodialysis   CAD (coronary artery disease), native coronary artery   Essential hypertension   Gas gangrene   History of present illness:  53 year old male patient with known vascular disease, chronic kidney disease on dialysis, hypertension, chronic steroids for gout brought to the emergency room on 08/16/14 with severe right leg pain, evaluated by vascular surgery, and underwent right femoral to below the knee popliteal artery bypass graft on 11/27. Subsequently, patient developed significant left leg swelling, with concerns for venous stenosis related to his left thigh arteriovenous graft.  On 12/3 patient underwent left thigh shuntogram with subsequent venoplasty and stenting of the left common femoral vein.Previous left foot swelling improved remarkably after this procedure.   On 12/5 patient developed significant hematochezia and unfortunately developed hemorrhagic shock requiring ICU transfer. He received a total of 11 units of PRBC, 4 units of FFP and temporarily required pressors to maintain blood pressure. Aspirin/Plavix were placed on hold. He slowly stabilized, hematochezia completely resolved, on 12/11 he started having brown bowel movements. GI planned to do colonoscopy, however patient refused this as bleeding had resolved.  Aspirin and Plavix were subsequently resumed, and patient was  discharged home on 09/05/14.   Unfortunately on 09/08/14, he had numerous episodes of recurrent hematochezia and presented back to the emergency room and was subsequently admitted. Patient underwent a colonoscopy on 12/21 which confirmed pandiverticulosis.  Unfortunately , this hospital course has been complicated by left great toe infection pt s/p toe amputation on 12/22.   Hospital Course:  Principal Problem:  Lower GI bleed: Suspect this is secondary to diverticular disease. Prior GI bleeding in his previous admission was attributed it to ischemic colitis- he refused colonoscopy that time.  -GI consulted, underwent colonoscopy on 12/22 which confirmed diverticulosis.  - Discontinued plavix and ASA - underwent flex sig on 12/31 - only mild diverticulosis found- recommended to resume anticoagulation in 1-2 wks   Active Problems:  Mild acute blood loss anemia: -  Secondary to above - Continue to monitor- renal managing  Infected Left Great Toe/Osteomyelitis:left great toe with purulent foul smelling drainage-suspect likely cultprit for leukocytosis on 12/20. Xray shows possible early gas gangrene, was initially started on empiric Vanco/Zosyn.Blood culture on 12/20 negative. VVS following, and patient is s/p amputation 12/22 .Leukocytosis has resolved with Abx. - d/c doxycycline and cephalexin on 12/30 and 12/31respectively -PT evaluation - home health PT    ESRD on hemodialysis:  - Nephrology consulted for dialysis. Regular schedule is TTS   History of CAD with prior drug-eluting stent April 2015:no acute cardiac issues, given recurrent hematochezia aspirin/Plavix held on admission. Colonoscopy demonstrated Diverticulosis. - Plavix will continue to be held    History of recent right ischemic UVO:ZDGUYQ post right femoropopliteal bypass on 11/27, continues to have chronic dry gangrene changes in his right foot (see pic below) -Vascular surgery following-conservative  management   History of recent left femoral vein occlusion: Underwent venoplasty of left common  femoral vein during his last admission, no swelling evident in his left leg.   History of hypertension: Currently controlled, cautiously continue with Coreg and amlodipine.   History of gout: Continue with chronic prednisone and colchicine   Penile lesion: Likely necroses area secondary to known vascular diease and recent hemorrhagic shock.  - discussed case with Dr Matilde Sprang Urology on call on 12/21 who reviewed chart and picture (taken 12/20), and recommended daily wound care and bacitracin. See picture below. He suspected the area will demarcate and heal slowly. Remains stable   Deconditioning/generalized weakness: Secondary to recent prolonged hospitalization/acute illness -HHPT recommended   Severe protein calorie malnutrition: Continue with supplements   Procedures: 09/12/14- Ampuatation of left great toe with metatarsal head  Consultations:  GI  Vascular surgery  Discharge Exam: Filed Weights   09/19/14 2134 09/21/14 0654 09/21/14 1023  Weight: 45.6 kg (100 lb 8.5 oz) 45.2 kg (99 lb 10.4 oz) 44.9 kg (98 lb 15.8 oz)   Filed Vitals:   09/21/14 1023  BP: 142/57  Pulse: 83  Temp: 97.6 F (36.4 C)  Resp: 19   Gen Exam: Awake and alert with clear speech.  Neck: Supple, No JVD.  Chest: B/L Clear.  CVS: No edema Abdomen: non tender, non distended.  Extremities: no edema, lower extremities warm to touch.  Left foot in dressing. Dry gangrene in several toes in right foot Neurologic: Non Focal.  Skin: No Rash.     Discharge Instructions You were cared for by a hospitalist during your hospital stay. If you have any questions about your discharge medications or the care you received while you were in the hospital after you are discharged, you can call the unit and asked to speak with the hospitalist on call if the hospitalist that took care of you is not  available. Once you are discharged, your primary care physician will handle any further medical issues. Please note that NO REFILLS for any discharge medications will be authorized once you are discharged, as it is imperative that you return to your primary care physician (or establish a relationship with a primary care physician if you do not have one) for your aftercare needs so that they can reassess your need for medications and monitor your lab values.     Medication List    ASK your doctor about these medications        amLODipine 5 MG tablet  Commonly known as:  NORVASC  Take 2 tablets (10 mg total) by mouth daily.     aspirin EC 81 MG tablet  Take 81 mg by mouth daily.     atorvastatin 40 MG tablet  Commonly known as:  LIPITOR  TAKE 1 TABLET BY MOUTH DAILY AT 6 PM     calcium acetate 667 MG tablet  Commonly known as:  PHOSLO  Take 3 tablets by mouth 3 (three) times daily.     carvedilol 6.25 MG tablet  Commonly known as:  COREG  Take 1 tablet (6.25 mg total) by mouth 2 (two) times daily with a meal.     cinacalcet 60 MG tablet  Commonly known as:  SENSIPAR  Take 1 tablet (60 mg total) by mouth daily.     clopidogrel 75 MG tablet  Commonly known as:  PLAVIX  Take 1 tablet (75 mg total) by mouth daily with breakfast.     colchicine 0.6 MG tablet  Commonly known as:  COLCRYS  Take 0.5 tablets (0.3 mg total) by mouth 2 (two)  times a week. On Monday and Thursday     feeding supplement (NEPRO CARB STEADY) Liqd  Take 237 mLs by mouth as needed (missed meal during dialysis.).     metoCLOPramide 10 MG tablet  Commonly known as:  REGLAN  Take 1 tablet (10 mg total) by mouth every 8 (eight) hours as needed for nausea or vomiting.     multivitamin Tabs tablet  Take 1 tablet by mouth daily.     oxyCODONE 5 MG immediate release tablet  Commonly known as:  ROXICODONE  Take 1-2 tablets (5-10 mg total) by mouth every 6 (six) hours as needed for severe pain.      pantoprazole 40 MG tablet  Commonly known as:  PROTONIX  Take 1 tablet (40 mg total) by mouth daily.     polyethylene glycol packet  Commonly known as:  MIRALAX / GLYCOLAX  Take 17 g by mouth daily.     predniSONE 5 MG tablet  Commonly known as:  DELTASONE  Take 1 tablet (5 mg total) by mouth daily.     senna-docusate 8.6-50 MG per tablet  Commonly known as:  Senokot-S  Take 1 tablet by mouth 2 (two) times daily as needed for mild constipation.     simethicone 40 MG/0.6ML drops  Commonly known as:  MYLICON  Take 0.6 mLs (40 mg total) by mouth 4 (four) times daily as needed for flatulence.       Allergies  Allergen Reactions  . Allopurinol Other (See Comments)    REACTION: decreased platelets      The results of significant diagnostics from this hospitalization (including imaging, microbiology, ancillary and laboratory) are listed below for reference.    Significant Diagnostic Studies: Dg Chest 2 View  09/10/2014   CLINICAL DATA:  Leukocytosis  EXAM: CHEST  2 VIEW  COMPARISON:  08/29/2014  FINDINGS: Bilateral mild apical pleural calcifications. Left apical calcifications which are new compared with prior chest x-ray dated 07/01/2012. There is a small-moderate left pleural effusion. There is no right pleural effusion. There is no pneumothorax. The cardiomediastinal silhouette is stable. No acute osseous abnormality.  IMPRESSION: 1. Small either moderate left pleural effusion. Left apical calcifications again noted which are new since the prior exam of 07/01/2012. These are likely postinflammatory but can be seen in the setting of tuberculosis. Correlate with PPD or QuantiFERON test.   Electronically Signed   By: Kathreen Devoid   On: 09/10/2014 10:35   Ct Abdomen Pelvis W Contrast  08/27/2014   CLINICAL DATA:  Acute onset of bright red blood per rectum, with periumbilical cramping abdominal pain. Initial encounter.  EXAM: CT ABDOMEN AND PELVIS WITH CONTRAST  TECHNIQUE: Multidetector  CT imaging of the abdomen and pelvis was performed using the standard protocol following bolus administration of intravenous contrast.  CONTRAST:  120mL OMNIPAQUE IOHEXOL 300 MG/ML  SOLN  COMPARISON:  CT of the abdomen and pelvis from 11/10/2012  FINDINGS: Trace bilateral pleural effusions are seen, with bibasilar atelectasis.  A nonspecific 6 mm hypodensity within the left hepatic lobe is of uncertain significance. The liver is otherwise unremarkable. The spleen is mildly bulky, but remains normal in length. There is persistent chronic gallbladder wall thickening. The gallbladder is difficult to fully assess given trace ascites tracking about the liver.  The pancreas and adrenal glands are unremarkable.  The patient's native kidneys are markedly atrophic, with scattered bilateral renal cysts of varying size and attenuation. Scattered associated calcifications are seen. There is no evidence of hydronephrosis. No  obstructing renal stones are seen.  There is marked fatty infiltration with regard to the transplant kidney at the left iliac fossa, with marked thinning of the renal parenchyma. Diffuse associated vascular calcifications are seen. This appearance is stable from 2014.  There is mild diffuse mesenteric and omental edema, nonspecific in appearance.  The stomach is within normal limits. No acute vascular abnormalities are seen. Diffuse calcification is seen along the abdominal aorta and its branches. Extensive diffuse vascular calcification is noted throughout the abdomen and pelvis.  The patient appears to be status post resection of much of the bowel. Remaining small bowel is grossly unremarkable. A distended anastomosis is noted at the right hemipelvis; this corresponds to the site of the prior right pelvic catheter, which has been removed.  The appendix is not definitely characterized; there is no evidence for appendicitis. Contrast progresses to the level of the proximal sigmoid colon. There is scattered  diverticulosis along the transverse colon. Stool is noted filling the distal sigmoid colon and rectum.  There is soft tissue inflammation about the distal sigmoid colon and rectum, raising question for proctitis. Diffuse presacral stranding is also noted, new from the prior study.  The bladder is not well assessed. Soft tissue density about the expected location of the bladder is relatively stable in appearance. 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 This is perhaps slightly more prominent than on the prior study. No inguinal lymphadenopathy is seen.  No acute osseous abnormalities are identified.  IMPRESSION: 1. Soft tissue inflammation about the distal sigmoid colon and rectum, with diffuse presacral stranding, new from the prior study. This raises concern for proctitis, which may explain the patient's bright red blood per rectum. Stool noted filling the distal sigmoid colon and rectum. 2. Scattered diverticulosis along the transverse colon, without evidence of diverticulitis; the colon is otherwise grossly unremarkable. 3. Trace bilateral pleural effusions, with bibasilar atelectasis. 4. Marked atrophy of the patient's bilateral native kidneys, with bilateral renal cysts and scattered calcifications. Marked fatty infiltration of the transplant kidney at the left iliac fossa, with marked thinning of the renal parenchyma. This appearance is stable from 2014. 5. Stable appearance to soft tissue density about the expected location of the bladder. 6. 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 7. Diffuse calcification along the abdominal aorta and its branches. Extensive diffuse vascular calcification seen throughout the abdomen and pelvis. 8. Nonspecific 6 mm hypodensity within the left hepatic lobe,  new from prior studies. 9. Persistent chronic gallbladder wall thickening; gallbladder difficult to fully assess given trace ascites about the liver, but this appearance is relatively stable. 10. Mild nonspecific diffuse mesenteric and omental edema.   Electronically Signed   By: Garald Balding M.D.   On: 08/27/2014 01:30   Dg Chest Port 1 View  08/29/2014   CLINICAL DATA:  Central line placement.  EXAM: PORTABLE CHEST - 1 VIEW  COMPARISON:  07/03/2012  FINDINGS: Interval placement of a left central venous catheter. Tip projects to the left mediastinum over the aortic arch. This could be in the brachiocephalic vein, in a normal variant left superior vena cava, or an arterial branch. No pneumothorax. Calcification in the left apex is likely postinflammatory. This is new since previous study. Heart size is normal. Hemidiaphragms are not included within the field of view.  IMPRESSION: Nonspecific  placement of left central venous catheter to the left of midline, suggesting placement either in the brachiocephalic vein, left superior vena cava, or arterial branch. No pneumothorax.   Electronically Signed   By: Lucienne Capers M.D.   On: 08/29/2014 02:17   Dg Abd Portable 1v  08/26/2014   CLINICAL DATA:  Acute GI bleed  EXAM: PORTABLE ABDOMEN - 1 VIEW  COMPARISON:  CT abdomen pelvis dated 11/10/2012  FINDINGS: Nonspecific but nonobstructive bowel gas pattern.  Surgical clips overlying the pelvis.  Vascular calcifications.  IMPRESSION: Unremarkable abdominal radiograph.   Electronically Signed   By: Julian Hy M.D.   On: 08/26/2014 22:30   Dg Toe Great Left  09/10/2014   ADDENDUM REPORT: 09/10/2014 10:44  ADDENDUM: These results were called by telephone at the time of interpretation on 09/10/2014 at 10:44 am to Dr. Oren Binet , who verbally acknowledged these results.   Electronically Signed   By: Lowella Grip M.D.   On: 09/10/2014 10:44   09/10/2014   CLINICAL DATA:  Loss of blood flow to  first toe ; pain  EXAM: LEFT FIRST TOE  COMPARISON:  None.  FINDINGS: Frontal, oblique, and lateral views were obtained. There is soft tissue swelling along the dorsal distal aspect of the first digit. There are scattered foci of air in the soft tissues, concerning for potential soft tissue infection in this area. There is no erosive change or bony destruction. No fracture or dislocation. Joint spaces appear intact. There is extensive arterial vascular calcification.  IMPRESSION: Soft tissue air is noted in the distal first digit. This finding is concerning for potential infection. Early gas gangrene could present in this manner and must be of concern. No bony destruction. No fracture or dislocation. Extensive arterial vascular calcification. Suspect diabetes mellitus.  Electronically Signed: By: Lowella Grip M.D. On: 09/10/2014 10:39    Microbiology: Recent Results (from the past 240 hour(s))  Surgical pcr screen     Status: None   Collection Time: 09/12/14  2:36 PM  Result Value Ref Range Status   MRSA, PCR NEGATIVE NEGATIVE Final   Staphylococcus aureus NEGATIVE NEGATIVE Final    Comment:        The Xpert SA Assay (FDA approved for NASAL specimens in patients over 49 years of age), is one component of a comprehensive surveillance program.  Test performance has been validated by [employer] for patients greater than or equal to 43 year old. It is not intended to diagnose infection nor to guide or monitor treatment.      Labs: Basic Metabolic Panel:  Recent Labs Lab 09/16/14 1324 09/19/14 1222 09/21/14 0700  NA 139 137 137  K 4.3 3.8 5.1  CL 105 101 102  CO2 25 25 28   GLUCOSE 90 105* 82  BUN 21 17 12   CREATININE 7.52* 8.39* 6.66*  CALCIUM 6.9* 7.2* 7.7*  PHOS 4.2 3.4  --    Liver Function Tests:  Recent Labs Lab 09/16/14 1324 09/19/14 1222  ALBUMIN 1.4* 1.5*   No results for input(s): LIPASE, AMYLASE in the last 168 hours. No results for input(s): AMMONIA in  the last 168 hours. CBC:  Recent Labs Lab 09/18/14 0457 09/19/14 0218 09/19/14 1221 09/20/14 0552 09/21/14 0700  WBC 10.3 16.9* 10.2 13.1* 12.3*  HGB 9.0* 9.5* 8.0* 8.0* 7.7*  HCT 28.4* 30.0* 24.5* 24.9* 24.6*  MCV 86.6 87.0 88.4 90.5 91.4  PLT 169 277 188 175 215   Cardiac Enzymes: No results for input(s): CKTOTAL,  CKMB, CKMBINDEX, TROPONINI in the last 168 hours. BNP: BNP (last 3 results) No results for input(s): PROBNP in the last 8760 hours. CBG: No results for input(s): GLUCAP in the last 168 hours.     SignedDebbe Odea, MD [facility] 09/21/2014, 11:01 AM

## 2014-09-21 NOTE — Op Note (Signed)
[facility] [place], [id number]   FLEXIBLE SIGMOIDOSCOPY PROCEDURE REPORT  PATIENT: [name], [name]  MR#: [id number] BIRTHDATE: 05/29/1961 , 10  yrs. old GENDER: male ENDOSCOPIST: Gatha Mayer, MD, [facility] PROCEDURE DATE:  09/21/2014 PROCEDURE:   Sigmoidoscopy, diagnostic ASA CLASS:   Class III INDICATIONS:hematochezia. MEDICATIONS: Fentanyl 50 mcg IV and Versed 4 mg IV  DESCRIPTION OF PROCEDURE:   After the risks benefits and alternatives of the procedure were thoroughly explained, informed consent was obtained.  Digital exam revealed no abnormalities of the rectum. The     endoscope was introduced through the anus  and advanced to the descending colon , The exam was Without limitations.    No prep given      .  The instrument was then slowly withdrawn as the mucosa was fully examined.         COLON FINDINGS: There was mild diverticulosis noted.   The colon mucosa was otherwise normal on this unprepped exam - fair views obtained with irrigation and suctioning. The scope was withdrawn from the patient and the procedure terminated.  COMPLICATIONS: There were no immediate complications.  ENDOSCOPIC IMPRESSION: 1.   Mild diverticulosis was noted 2.   The colon mucosa was otherwise normal and so was rectum. - no cause of bleeding other than diverticulosis seen.  RECOMMENDATIONS:  No easy answer - I suggest leaving off clopidogrel 1-2 more weeks and as long as no more bleeding can try to restart - perhaps whatever vessel he bled from before  with diverticular bleed is still healing.  eSigned:  Gatha Mayer, MD, [facility] 09/21/2014 2:51 PM

## 2014-09-21 NOTE — Telephone Encounter (Addendum)
-----   Message from Mena Goes, RN sent at 09/21/2014 11:19 AM EST ----- Regarding: Schedule   ----- Message -----    From: Gabriel Earing, PA-C    Sent: 09/21/2014  11:07 AM      To: Vvs Charge Pool  F/u with Dr. Trula Slade in 2 weeks for left great toe amp for wound check.   Thanks, Samantha  notified patient of post op appt. on 10-09-13 at 3:15 with dr. Trula Slade

## 2014-09-21 NOTE — Procedures (Signed)
I was present at this dialysis session, have reviewed the session itself and made  appropriate changes  [name] MD (pgr) [phone]    ([phone] 09/21/2014, 11:09 AM

## 2014-09-21 NOTE — Care Management Note (Signed)
CARE MANAGEMENT NOTE 09/21/2014  Patient:  [name], [name]   Account Number:  [id number]  Date Initiated:  09/12/2014  Documentation initiated by:  Violetta Lavalle  Subjective/Objective Assessment:   CM following for progression and d/c planning.     Action/Plan:   09/05/2014 Met with pt and IM given and explained. Pt states understanding, is anxious to be able to d/c from the hospital.  09/21/2014 [facility] notified of plan to d/c to home today, [facility] services to resume.   Anticipated DC Date:  09/21/2014   Anticipated DC Plan:  [place]         Choice offered to / List presented to:          [facility] arranged  HH-1 RN  [place]      [place].   Status of service:  Completed, signed off Medicare Important Message given?  YES (If response is "NO", the following Medicare IM given date fields will be blank) Date Medicare IM given:  09/12/2014 Medicare IM given by:  Maloree Uplinger Date Additional Medicare IM given:  09/18/2014 Additional Medicare IM given by:  [facility]  Discharge Disposition:    Per UR Regulation:    If discussed at Long Length of Stay Meetings, dates discussed:   09/19/2014    Comments:  09/12/2014 Pt setup with [facility] for [place] services at the time of last d/c, will continue these services.   CRoyal RN MPH, case Freight forwarder.

## 2014-09-21 NOTE — Progress Notes (Signed)
PT Cancellation Note  Patient Details Name: [name] MRN: [id number] DOB: 04/05/1961   Cancelled Treatment:    Reason Eval/Treat Not Completed: Patient at procedure or test/unavailable. Pt in HD this morning and now off unit again for procedure this afternoon. Will continue to follow and check back as schedule allows.    Rolinda Roan 09/21/2014, 1:25 PM   Rolinda Roan, PT, DPT Acute Rehabilitation Services Pager: [phone]

## 2014-09-21 NOTE — Progress Notes (Signed)
Hemodialysis- Completed without issue, no UF. Tolerated well. Has not had anything to eat or drink pending procedure.

## 2014-09-22 ENCOUNTER — Encounter (HOSPITAL_COMMUNITY): Payer: Self-pay | Admitting: Internal Medicine

## 2014-09-24 ENCOUNTER — Emergency Department (HOSPITAL_COMMUNITY)
Admission: EM | Admit: 2014-09-24 | Discharge: 2014-09-24 | Disposition: A | Discharge status: Home / Self Care | Payer: Medicare Other | Attending: Emergency Medicine | Admitting: Emergency Medicine

## 2014-09-24 ENCOUNTER — Emergency Department (HOSPITAL_COMMUNITY): Payer: Medicare Other

## 2014-09-24 ENCOUNTER — Encounter (HOSPITAL_COMMUNITY): Payer: Self-pay | Admitting: Physical Medicine and Rehabilitation

## 2014-09-24 DIAGNOSIS — Z86718 Personal history of other venous thrombosis and embolism: Secondary | ICD-10-CM | POA: Insufficient documentation

## 2014-09-24 DIAGNOSIS — Z8619 Personal history of other infectious and parasitic diseases: Secondary | ICD-10-CM | POA: Diagnosis not present

## 2014-09-24 DIAGNOSIS — I120 Hypertensive chronic kidney disease with stage 5 chronic kidney disease or end stage renal disease: Secondary | ICD-10-CM | POA: Insufficient documentation

## 2014-09-24 DIAGNOSIS — I2510 Atherosclerotic heart disease of native coronary artery without angina pectoris: Secondary | ICD-10-CM | POA: Insufficient documentation

## 2014-09-24 DIAGNOSIS — Z8639 Personal history of other endocrine, nutritional and metabolic disease: Secondary | ICD-10-CM | POA: Diagnosis not present

## 2014-09-24 DIAGNOSIS — Z87891 Personal history of nicotine dependence: Secondary | ICD-10-CM | POA: Insufficient documentation

## 2014-09-24 DIAGNOSIS — R221 Localized swelling, mass and lump, neck: Secondary | ICD-10-CM

## 2014-09-24 DIAGNOSIS — N186 End stage renal disease: Secondary | ICD-10-CM | POA: Diagnosis not present

## 2014-09-24 DIAGNOSIS — Z862 Personal history of diseases of the blood and blood-forming organs and certain disorders involving the immune mechanism: Secondary | ICD-10-CM | POA: Diagnosis not present

## 2014-09-24 DIAGNOSIS — Z8719 Personal history of other diseases of the digestive system: Secondary | ICD-10-CM | POA: Insufficient documentation

## 2014-09-24 DIAGNOSIS — Z853 Personal history of malignant neoplasm of breast: Secondary | ICD-10-CM | POA: Diagnosis not present

## 2014-09-24 DIAGNOSIS — Z940 Kidney transplant status: Secondary | ICD-10-CM | POA: Diagnosis not present

## 2014-09-24 DIAGNOSIS — Z79899 Other long term (current) drug therapy: Secondary | ICD-10-CM | POA: Diagnosis not present

## 2014-09-24 DIAGNOSIS — B1920 Unspecified viral hepatitis C without hepatic coma: Secondary | ICD-10-CM | POA: Diagnosis not present

## 2014-09-24 DIAGNOSIS — Z992 Dependence on renal dialysis: Secondary | ICD-10-CM | POA: Insufficient documentation

## 2014-09-24 DIAGNOSIS — T17308A Unspecified foreign body in larynx causing other injury, initial encounter: Secondary | ICD-10-CM

## 2014-09-24 DIAGNOSIS — Z7902 Long term (current) use of antithrombotics/antiplatelets: Secondary | ICD-10-CM | POA: Insufficient documentation

## 2014-09-24 DIAGNOSIS — N481 Balanitis: Secondary | ICD-10-CM | POA: Insufficient documentation

## 2014-09-24 DIAGNOSIS — K1379 Other lesions of oral mucosa: Secondary | ICD-10-CM

## 2014-09-24 DIAGNOSIS — I5042 Chronic combined systolic (congestive) and diastolic (congestive) heart failure: Secondary | ICD-10-CM | POA: Diagnosis not present

## 2014-09-24 DIAGNOSIS — Z8739 Personal history of other diseases of the musculoskeletal system and connective tissue: Secondary | ICD-10-CM | POA: Diagnosis not present

## 2014-09-24 DIAGNOSIS — R600 Localized edema: Secondary | ICD-10-CM | POA: Diagnosis present

## 2014-09-24 LAB — COMPREHENSIVE METABOLIC PANEL
ALBUMIN: 1.6 g/dL — AB (ref 3.5–5.2)
ALT: 11 U/L (ref 0–53)
AST: 28 U/L (ref 0–37)
Alkaline Phosphatase: 101 U/L (ref 39–117)
Anion gap: 8 (ref 5–15)
BUN: 5 mg/dL — AB (ref 6–23)
CO2: 31 mmol/L (ref 19–32)
Calcium: 7.5 mg/dL — ABNORMAL LOW (ref 8.4–10.5)
Chloride: 102 mEq/L (ref 96–112)
Creatinine, Ser: 4.01 mg/dL — ABNORMAL HIGH (ref 0.50–1.35)
GFR calc Af Amer: 18 mL/min — ABNORMAL LOW (ref >90)
GFR, EST NON AFRICAN AMERICAN: 15 mL/min — AB (ref >90)
Glucose, Bld: 73 mg/dL (ref 70–99)
POTASSIUM: 3.6 mmol/L (ref 3.5–5.1)
Sodium: 141 mmol/L (ref 135–145)
Total Bilirubin: 0.9 mg/dL (ref 0.3–1.2)
Total Protein: 5.2 g/dL — ABNORMAL LOW (ref 6.0–8.3)

## 2014-09-24 LAB — CBC WITH DIFFERENTIAL/PLATELET
BASOS ABS: 0 10*3/uL (ref 0.0–0.1)
Basophils Relative: 0 % (ref 0–1)
EOS ABS: 0.1 10*3/uL (ref 0.0–0.7)
Eosinophils Relative: 1 % (ref 0–5)
HCT: 26.7 % — ABNORMAL LOW (ref 39.0–52.0)
HEMOGLOBIN: 8 g/dL — AB (ref 13.0–17.0)
Lymphocytes Relative: 22 % (ref 12–46)
Lymphs Abs: 2 10*3/uL (ref 0.7–4.0)
MCH: 27.8 pg (ref 26.0–34.0)
MCHC: 30 g/dL (ref 30.0–36.0)
MCV: 92.7 fL (ref 78.0–100.0)
MONO ABS: 1.3 10*3/uL — AB (ref 0.1–1.0)
MONOS PCT: 14 % — AB (ref 3–12)
NEUTROS ABS: 5.9 10*3/uL (ref 1.7–7.7)
NEUTROS PCT: 63 % (ref 43–77)
Platelets: 125 10*3/uL — ABNORMAL LOW (ref 150–400)
RBC: 2.88 MIL/uL — ABNORMAL LOW (ref 4.22–5.81)
RDW: 18.3 % — ABNORMAL HIGH (ref 11.5–15.5)
WBC: 9.3 10*3/uL (ref 4.0–10.5)

## 2014-09-24 LAB — PROTIME-INR
INR: 1.28 (ref 0.00–1.49)
Prothrombin Time: 16.2 seconds — ABNORMAL HIGH (ref 11.6–15.2)

## 2014-09-24 LAB — RPR

## 2014-09-24 LAB — HIV ANTIBODY (ROUTINE TESTING W REFLEX): HIV 1&2 Ab, 4th Generation: NONREACTIVE

## 2014-09-24 MED ORDER — DEXAMETHASONE SODIUM PHOSPHATE 10 MG/ML IJ SOLN
10.0000 mg | Freq: Once | INTRAMUSCULAR | Status: AC
  Administered 2014-09-24: 10 mg via INTRAVENOUS
  Filled 2014-09-24: qty 1

## 2014-09-24 MED ORDER — PREDNISONE 10 MG PO TABS: 20.0000 mg | ORAL_TABLET | Freq: Every day | ORAL | Status: AC

## 2014-09-24 MED ORDER — CALCIUM GLUCONATE 10 % IV SOLN: 1.0000 g | Freq: Once | INTRAVENOUS | Status: DC

## 2014-09-24 MED ORDER — DIPHENHYDRAMINE HCL 50 MG/ML IJ SOLN
25.0000 mg | Freq: Once | INTRAMUSCULAR | Status: AC
  Administered 2014-09-24: 25 mg via INTRAVENOUS
  Filled 2014-09-24: qty 1

## 2014-09-24 MED ORDER — DIPHENHYDRAMINE HCL 25 MG PO TABS: 25.0000 mg | ORAL_TABLET | Freq: Four times a day (QID) | ORAL | Status: AC | PRN

## 2014-09-24 MED ORDER — SODIUM CHLORIDE 3 % IV SOLN: INTRAVENOUS | Status: DC

## 2014-09-24 MED ORDER — FLUCONAZOLE 200 MG PO TABS: 200.0000 mg | ORAL_TABLET | Freq: Every day | ORAL | Status: AC

## 2014-09-24 NOTE — ED Provider Notes (Signed)
CSN: [id number]     Arrival date & time 09/24/14  0901 History   First MD Initiated Contact with Patient 09/24/14 [phone]     Chief Complaint  Patient presents with  . Aspiration  . Edema     (Consider location/radiation/quality/duration/timing/severity/associated sxs/prior Treatment) HPI   PCP: Placido Sou, MD  EPIFANIO LABRADOR is a 54 y.o.male with a significant PMH of ESRD (TTS- [facility] [facility] in left thigh), hypertension, CHF, GERD, HEP C, prostate cancer hx, dry gangrene, malnutrition  presents to the ER with complaints of choking on water and swelling in his throat. He reports it started this morning after drinking a cup of water he coughed and choked. He feels like a large amount of phlegm is in the back of his throat but he is unable to cough it up. He denies sore throat, Wheezing, SOB, allergies, CP, tongue/lip swelling, fevers, coughing. He is in NAD and A&O x 3.  Blood pressure 155/63, temperature 98.3 F (36.8 C), temperature source Oral, resp. rate 12, SpO2 99 %.     Past Medical History  Diagnosis Date  . ESRD on hemodialysis     a. ESRD 2/2 HTN with renal transplant in 1987 (cadaveric) after short period of dialysis;  b. Transplant failed in 2004 and he went back on HD;  c. As of 10/15 getting HD via L thigh AVG on a TTS schedule at [facility] on [facility].  . Hypertension   . Hx of kidney transplant     a. 1987-> back on HD since 2004  . Gout tophi   . Chronic steroid use     a. Has severe gout. Did not tolerate Allopurinol. Do not taper per PCP   . Systolic CHF, chronic     2 D echo 04/2012 with EF of 45 %   . Malnutrition   . Hx SBO 04/2012    a. 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 F/U with Dr Donne Hazel. Started on TNA  . Anemia associated with chronic renal failure   . Hepatitis C   . GERD (gastroesophageal reflux disease)   . H/O hiatal hernia   . Arthritis   . History of DVT (deep  vein thrombosis)   . Peripheral vascular disease     a. 12/2013 PTA of L Pop;  b. 12/2013 PTA R pop, R DP;  c. 06/2014 L Pop CBA/DCB PTA.  . Prostate cancer   . CAD (coronary artery disease)     a. 12/2013 Cath/PCI: EF 45-50%, LM Ca2+, LAD 30-40p, 73m, D1/2/3 min irregs, LCX 50ost, 30-69m, RCA 70p, 95/75m (Rota->3.0x23 Xience distal, 3.0x23 Xience mid, 3.0x28 Xience prox).  . Chronic diastolic CHF (congestive heart failure)     a. 12/2013 Echo: EF 55-60%, mild LVH, nl wall motion, Gr 2 DD.  . Dry gangrene     a. L great toe   Past Surgical History  Procedure Laterality Date  . Laparotomy  04/14/2012    Procedure: EXPLORATORY LAPAROTOMY;  Surgeon: Joyice Faster. Cornett, MD;  Location: [place];  Service: General;  Laterality: N/A;  . Colon resection  04/14/2012  . Laparotomy  04/22/2012    Procedure: EXPLORATORY LAPAROTOMY;  Surgeon: Adin Hector, MD;  Location: [place];  Service: General;  Laterality: N/A;  lysis of adhesions, omentoplasty, repair small bowel  . Thrombectomy w/ embolectomy  04/27/2012    Procedure: THROMBECTOMY ARTERIOVENOUS GORE-TEX GRAFT;  Surgeon: Judeth Cornfield  Scot Dock, MD;  Location: [place];  Service: Vascular;  Laterality: Left;  Thrombectomy of left thigh arteriovenous gortex graft  . Prostectomy  2011  . Renal grafts    . Pelvic abcess drainage Right 6/14    removal drain s/p bowl resection 13  . Transanal excision of rectal mass N/A 03/30/2013    Procedure: EXCISION OF anal MASS;  Surgeon: Joyice Faster. Cornett, MD;  Location: [place];  Service: General;  Laterality: N/A;  Exam under anesthesia with excision anal verge mass  . Aortogram  08/15/2014    abd aortogram  . Femoral-popliteal bypass graft Right 08/18/2014    Procedure: BYPASS GRAFT FEMORAL-POPLITEAL ARTERY with Gortex Graft;  Surgeon: Serafina Mitchell, MD;  Location: [place];  Service: Vascular;  Laterality: Right;  . Shuntogram Left 09/29/2012    Procedure: SHUNTOGRAM;  Surgeon: Serafina Mitchell, MD;  Location: [facility] CATH LAB;   Service: Cardiovascular;  Laterality: Left;  . Abdominal aortagram N/A 01/03/2014    Procedure: ABDOMINAL Maxcine Ham;  Surgeon: Serafina Mitchell, MD;  Location: [facility] CATH LAB;  Service: Cardiovascular;  Laterality: N/A;  . Abdominal aortagram Right 01/11/2014    Procedure: ABDOMINAL AORTAGRAM;  Surgeon: Serafina Mitchell, MD;  Location: [facility] CATH LAB;  Service: Cardiovascular;  Laterality: Right;  . Left heart catheterization with coronary angiogram N/A 01/12/2014    Procedure: LEFT HEART CATHETERIZATION WITH CORONARY ANGIOGRAM;  Surgeon: Leonie Man, MD;  Location: [facility] CATH LAB;  Service: Cardiovascular;  Laterality: N/A;  . Percutaneous coronary rotoblator intervention (pci-r) N/A 01/13/2014    Procedure: PERCUTANEOUS CORONARY ROTOBLATOR INTERVENTION (PCI-R);  Surgeon: Burnell Blanks, MD;  Location: [facility] CATH LAB;  Service: Cardiovascular;  Laterality: N/A;  . Abdominal aortagram N/A 07/19/2014    Procedure: ABDOMINAL AORTAGRAM;  Surgeon: Serafina Mitchell, MD;  Location: [facility] CATH LAB;  Service: Cardiovascular;  Laterality: N/A;  . Shuntogram N/A 08/24/2014    Procedure: Earney Mallet;  Surgeon: Conrad Oriental, MD;  Location: [facility] CATH LAB;  Service: Cardiovascular;  Laterality: N/A;  . Colonoscopy N/A 09/11/2014    Procedure: COLONOSCOPY;  Surgeon: Inda Castle, MD;  Location: [place];  Service: Endoscopy;  Laterality: N/A;  . Amputation Left 09/12/2014    Procedure: AMPUTATION DIGIT-LEFT GREAT TOE;  Surgeon: Serafina Mitchell, MD;  Location: [place];  Service: Vascular;  Laterality: Left;  . Flexible sigmoidoscopy N/A 09/21/2014    Procedure: FLEXIBLE SIGMOIDOSCOPY;  Surgeon: Gatha Mayer, MD;  Location: [place];  Service: Endoscopy;  Laterality: N/A;   Family History  Problem Relation Age of Onset  . Hypertension Father   . Pneumonia Brother   . Lung disease Brother    History  Substance Use Topics  . Smoking status: Former Smoker -- 1.00 packs/day for 10 years    Types: Cigarettes     Quit date: 03/23/1973  . Smokeless tobacco: Never Used     Comment: Quit 30 years ago  . Alcohol Use: No    Review of Systems  10 Systems reviewed and are negative for acute change except as noted in the HPI.   Allergies  Allopurinol  Home Medications   Prior to Admission medications   Medication Sig Start Date End Date Taking? Authorizing Provider  amLODipine (NORVASC) 5 MG tablet Take 2 tablets (10 mg total) by mouth daily. 09/05/14  Yes Shanker Kristeen Mans, MD  atorvastatin (LIPITOR) 40 MG tablet TAKE 1 TABLET BY MOUTH DAILY AT 6 PM 09/05/14  Yes Shanker Kristeen Mans, MD  calcium  acetate (PHOSLO) 667 MG tablet Take 3 tablets by mouth 3 (three) times daily. 09/05/14  Yes Shanker Kristeen Mans, MD  carvedilol (COREG) 6.25 MG tablet Take 1 tablet (6.25 mg total) by mouth 2 (two) times daily with a meal. 09/05/14  Yes Shanker Kristeen Mans, MD  cinacalcet (SENSIPAR) 60 MG tablet Take 1 tablet (60 mg total) by mouth daily. 09/05/14  Yes Shanker Kristeen Mans, MD  clopidogrel (PLAVIX) 75 MG tablet Take 75 mg by mouth daily.  09/07/14  Yes Historical Provider, MD  colchicine (COLCRYS) 0.6 MG tablet Take 0.5 tablets (0.3 mg total) by mouth 2 (two) times a week. On Monday and Thursday 09/05/14  Yes Shanker Kristeen Mans, MD  HYDROmorphone (DILAUDID) 2 MG tablet Take 2 mg by mouth every 6 (six) hours as needed for severe pain.  08/07/14  Yes Historical Provider, MD  metoCLOPramide (REGLAN) 10 MG tablet Take 1 tablet (10 mg total) by mouth every 8 (eight) hours as needed for nausea or vomiting. 09/05/14  Yes Shanker Kristeen Mans, MD  multivitamin (RENA-VIT) TABS tablet Take 1 tablet by mouth daily. 09/05/14  Yes Shanker Kristeen Mans, MD  oxyCODONE (ROXICODONE) 5 MG immediate release tablet Take 1-2 tablets (5-10 mg total) by mouth every 6 (six) hours as needed for severe pain. 09/05/14  Yes Shanker Kristeen Mans, MD  pantoprazole (PROTONIX) 40 MG tablet Take 1 tablet (40 mg total) by mouth daily. 09/05/14  Yes Shanker Kristeen Mans, MD  polyethylene glycol (MIRALAX / GLYCOLAX) packet Take 17 g by mouth daily. 09/05/14  Yes Shanker Kristeen Mans, MD  predniSONE (DELTASONE) 5 MG tablet Take 1 tablet (5 mg total) by mouth daily. 09/05/14  Yes Shanker Kristeen Mans, MD  senna-docusate (SENOKOT-S) 8.6-50 MG per tablet Take 1 tablet by mouth 2 (two) times daily as needed for mild constipation. 09/05/14  Yes Shanker Kristeen Mans, MD  Nutritional Supplements (FEEDING SUPPLEMENT, NEPRO CARB STEADY,) LIQD Take 237 mLs by mouth as needed (missed meal during dialysis.). Patient not taking: Reported on 09/08/2014 09/05/14   Jonetta Osgood, MD  simethicone (MYLICON) 40 KL/4.9ZP drops Take 0.6 mLs (40 mg total) by mouth 4 (four) times daily as needed for flatulence. Patient not taking: Reported on 09/08/2014 09/05/14   Jonetta Osgood, MD   BP 142/64 mmHg  Pulse 86  Temp(Src) 98.3 F (36.8 C) (Oral)  Resp 12  SpO2 100% Physical Exam  Constitutional: He appears well-developed and well-nourished. He appears cachectic. No distress.  HENT:  Head: Normocephalic and atraumatic.  Large uvula that is not red or erythematous. NO pustules. Tonsills are normal size.  Eyes: Pupils are equal, round, and reactive to light.  Neck: Normal range of motion. Neck supple.  Cardiovascular: Normal rate and regular rhythm.   Pulmonary/Chest: Effort normal.  Abdominal: Soft.  Genitourinary: Penis normal. Uncircumcised.  Drainage and rash to foreskin  Neurological: He is alert.  Skin: Skin is warm and dry.  Nursing note and vitals reviewed.   ED Course  Procedures (including critical care time) Labs Review Labs Reviewed  CBC WITH DIFFERENTIAL - Abnormal; Notable for the following:    RBC 2.88 (*)    Hemoglobin 8.0 (*)    HCT 26.7 (*)    RDW 18.3 (*)    Platelets 125 (*)    Monocytes Relative 14 (*)    Monocytes Absolute 1.3 (*)    All other components within normal limits  COMPREHENSIVE METABOLIC PANEL - Abnormal; Notable for the  following:    BUN  5 (*)    Creatinine, Ser 4.01 (*)    Calcium 7.5 (*)    Total Protein 5.2 (*)    Albumin 1.6 (*)    GFR calc non Af Amer 15 (*)    GFR calc Af Amer 18 (*)    All other components within normal limits  PROTIME-INR - Abnormal; Notable for the following:    Prothrombin Time 16.2 (*)    All other components within normal limits  RPR  HIV ANTIBODY (ROUTINE TESTING)    Imaging Review Dg Neck Soft Tissue  09/24/2014   CLINICAL DATA:  Uvular swelling.  EXAM: NECK SOFT TISSUES - 1+ VIEW  COMPARISON:  None.  FINDINGS: The base of the tongue and epiglottis appear normal. No prevertebral soft tissue swelling. There is prominent vascular calcification in the soft tissues in the submandibular regions bilaterally as well as in the of the soft tissues of the neck.  No significant osseous abnormality of the cervical spine. No disc space narrowing or facet arthritis. Trachea appears normal. There is no evidence of retropharyngeal soft tissue swelling or epiglottic enlargement. The cervical airway is unremarkable.  There are a amorphous calcifications in the lung apices, left greater than right concerning for granulomatous disease.  IMPRESSION: No acute abnormalities.   Electronically Signed   By: Rozetta Nunnery M.D.   On: 09/24/2014 10:23   Dg Chest 2 View  09/24/2014   CLINICAL DATA:  Face swelling.  Choking.  EXAM: CHEST  2 VIEW  COMPARISON:  08/2014  FINDINGS: Midline trachea. Mild cardiomegaly with atherosclerosis in the transverse aorta. A small left pleural effusion is similar. No pneumothorax. Left base airspace disease his minimally increased. Left greater than right apical clustered calcifications are similar.  IMPRESSION: Similar left pleural effusion with worsening adjacent airspace disease. This is suspicious for infection. Recommend radiographic follow-up until clearing.  Biapical calcifications, again favored to be related to remote infection, including atypical etiologies.    Electronically Signed   By: Abigail Miyamoto M.D.   On: 09/24/2014 10:10     EKG Interpretation None      MDM   Final diagnoses:  Swollen uvula  Choking  Uvular swelling  Balanitis   After being monitored in the ED the patient uvulitis has significantly improved as well as his perception of throat swelling.  His chest xray shows concern for possible infection but clinically the patient does not have upper respiratory symptoms. He is not coughing, no SOB, no wheezing, fever, no WBC. His uvula is not erythematous and is not painful. I do not believe this is from an infectious etiology.  Pt has been seen by Dr. Jeneen Rinks and he feels that after monitoring the patient can be discharged with Prednisone 20 mg for 5 days, Benadryl and Diflucan for his balanitis. Given strict return to ED precautions, RPR and HIV test drawn before discharge.  54 y.o.Marcy L Hanselman's evaluation in the Emergency Department is complete. It has been determined that no acute conditions requiring further emergency intervention are present at this time. The patient/guardian have been advised of the diagnosis and plan. We have discussed signs and symptoms that warrant return to the ED, such as changes or worsening in symptoms.  Vital signs are stable at discharge. Filed Vitals:   09/24/14 1230  BP: 142/64  Pulse: 86  Temp:   Resp:     Patient/guardian has voiced understanding and agreed to follow-up with the PCP or specialist.      Linus Mako,  PA-C 09/24/14 [place], MD 10/04/14 [phone]

## 2014-09-24 NOTE — Discharge Instructions (Signed)
Balanitis Balanitis is inflammation of the head of the penis (glans).  CAUSES  Balanitis has multiple causes, both infectious and noninfectious. Often balanitis is the result of poor personal hygiene, especially in uncircumcised males. Without adequate washing, viruses, bacteria, and yeast collect between the foreskin and the glans. This can cause an infection. Lack of air and irritation from a normal secretion called smegma contribute to the cause in uncircumcised males. Other causes include:  Chemical irritation from the use of certain soaps and shower gels (especially soaps with perfumes), condoms, personal lubricants, petroleum jelly, spermicides, and fabric conditioners.  Skin conditions, such as eczema, dermatitis, and psoriasis.  Allergies to drugs, such as tetracycline and sulfa.  Certain medical conditions, including liver cirrhosis, congestive heart failure, and kidney disease.  Morbid obesity. RISK FACTORS  Diabetes mellitus.  A tight foreskin that is difficult to pull back past the glans (phimosis).  Sex without the use of a condom. SIGNS AND SYMPTOMS  Symptoms may include:  Discharge coming from under the foreskin.  Tenderness.  Itching and inability to get an erection (because of the pain).  Redness and a rash.  Sores on the glans and on the foreskin. DIAGNOSIS Diagnosis of balanitis is confirmed through a physical exam. TREATMENT The treatment is based on the cause of the balanitis. Treatment may include:  Frequent cleansing.  Keeping the glans and foreskin dry.  Use of medicines such as creams, pain medicines, antibiotics, or medicines to treat fungal infections.  Sitz baths. If the irritation has caused a scar on the foreskin that prevents easy retraction, a circumcision may be recommended.  HOME CARE INSTRUCTIONS  Sex should be avoided until the condition has cleared. MAKE SURE YOU:  Understand these instructions.  Will watch your  condition.  Will get help right away if you are not doing well or get worse. Document Released: 01/25/2009 Document Revised: 09/13/2013 Document Reviewed: 02/28/2013 [facility] Patient Information 2015 [place], [place]. This information is not intended to replace advice given to you by your health care provider. Make sure you discuss any questions you have with your health care provider. Uvulitis Uvulitis is redness and soreness (inflammation) of the uvula. The uvula is the small tongue-shaped piece of tissue in the back of your mouth.  CAUSES Infection is a common cause of uvulitis. Infection of the uvula can be either viral or bacterial. Infectious uvulitis usually only occurs in association with another condition, such as inflammation and infection of the mouth or throat. Other causes of uvulitis include:  Trauma to the uvula.  Swelling from excess fluid buildup (edema), which may be an allergic reaction.  Inhalation of irritants, such as chemical agents, smoke, or steam. DIAGNOSIS Your caregiver can usually diagnose uvulitis through a physical examination. Bacterial uvulitis can be diagnosed through the results of the growth of samples of bodily substances taken from your mouth (cultures). HOME CARE INSTRUCTIONS   Rest as much as possible.  Young children may suck on frozen juice bars or frozen ice pops. Older children and adults may gargle with a warm or cold liquid to help soothe the throat. (Mix  tsp of salt in 8 oz of water, or use strong tea.)  Use a cool-mist humidifier to lessen throat irritation and cough.  Drink enough fluids to keep your urine clear or pale yellow.  While the throat is very sore, eat soft or liquid foods such as milk, ice cream, soups, or milk drinks.  Family members who develop a sore throat or fever should  have a medical exam or throat culture.  If your child has uvulitis and is taking antibiotic medicine, wait 24 hours or until his or her temperature  is near normal (less than 100 F [37.8 C]) before allowing him or her to return to school or day care.  Only take over-the-counter or prescription medicines for pain, discomfort, or fever as directed by your caregiver. Ask when your test results will be ready. Make sure you get your test results. SEEK MEDICAL CARE IF:   You have an oral temperature above 102 F (38.9 C).  You develop large, tender lumps your the neck.  Your child develops a rash.  You cough up green, yellow-brown, or bloody substances. SEEK IMMEDIATE MEDICAL CARE IF:   You develop any new symptoms, such as vomiting, earache, severe headache, stiff neck, chest pain, or trouble breathing or swallowing.  Your airway is blocked.  You develop more severe throat pain along with drooling or voice changes. Document Released: 04/18/2004 Document Revised: 12/01/2011 Document Reviewed: 11/14/2010 [facility] Patient Information 2015 [place], [place]. This information is not intended to replace advice given to you by your health care provider. Make sure you discuss any questions you have with your health care provider.

## 2014-09-24 NOTE — ED Notes (Signed)
Pt states difficulty swallowing this morning, states he choked on water at home. Also reports swelling to face/neck and all over body. Pt is alert and oriented x4. Respirations unlabored. Speaking complete sentences.

## 2014-09-24 NOTE — ED Provider Notes (Signed)
Patient seen and evaluated upon his arrival. Discussed with PA Verdene Rio. Reevaluated 2 by me. Lastly at 2:50 PM. He has minimal to no residual edema of his uvula noted. He is able to lay supine without apprehension. He has normal voice. He is able to drink fluids without any perceived difficulty or discomfort. This point I feel is appropriate for outpatient treatment. We will increase his daily prednisone from 5-20 mg per day for the next several days. Daily antihistamine. Recheck here with any worsening of symptoms.  Patient otherwise appears at his baseline.  Continues to complain of penis pain and exudate at the glans.  Exam shows balanitis, but also focal area of gland thickening without frank ulceration.  Will treat with PO diflucan.  RPR ordered.  Tanna Furry, MD 09/24/14 [phone]

## 2014-09-24 NOTE — ED Notes (Addendum)
Pt presents to department for evaluation of possible aspiration, pt states he was drinking water this morning when he began having difficulty swallowing and choked. Also reports swelling to face and extremities. Hemodialysis patient, treatments on Tuesday, Thursday and Saturday, last treatment on Friday. Recent vascular surgery, states L great toe amputation. HD catheter to L thigh.

## 2014-10-06 ENCOUNTER — Encounter: Payer: Self-pay | Admitting: Surgery

## 2014-10-08 ENCOUNTER — Encounter (HOSPITAL_COMMUNITY): Payer: Self-pay | Admitting: Emergency Medicine

## 2014-10-08 ENCOUNTER — Emergency Department (HOSPITAL_COMMUNITY)
Admission: EM | Admit: 2014-10-08 | Discharge: 2014-10-08 | Disposition: A | Discharge status: Home / Self Care | Payer: Medicare Other | Attending: Emergency Medicine | Admitting: Emergency Medicine

## 2014-10-08 DIAGNOSIS — Z8546 Personal history of malignant neoplasm of prostate: Secondary | ICD-10-CM | POA: Diagnosis not present

## 2014-10-08 DIAGNOSIS — Z940 Kidney transplant status: Secondary | ICD-10-CM | POA: Diagnosis not present

## 2014-10-08 DIAGNOSIS — E46 Unspecified protein-calorie malnutrition: Secondary | ICD-10-CM | POA: Diagnosis not present

## 2014-10-08 DIAGNOSIS — I96 Gangrene, not elsewhere classified: Secondary | ICD-10-CM

## 2014-10-08 DIAGNOSIS — Z8619 Personal history of other infectious and parasitic diseases: Secondary | ICD-10-CM | POA: Insufficient documentation

## 2014-10-08 DIAGNOSIS — I2510 Atherosclerotic heart disease of native coronary artery without angina pectoris: Secondary | ICD-10-CM | POA: Insufficient documentation

## 2014-10-08 DIAGNOSIS — K219 Gastro-esophageal reflux disease without esophagitis: Secondary | ICD-10-CM | POA: Insufficient documentation

## 2014-10-08 DIAGNOSIS — I5022 Chronic systolic (congestive) heart failure: Secondary | ICD-10-CM | POA: Insufficient documentation

## 2014-10-08 DIAGNOSIS — I120 Hypertensive chronic kidney disease with stage 5 chronic kidney disease or end stage renal disease: Secondary | ICD-10-CM | POA: Diagnosis not present

## 2014-10-08 DIAGNOSIS — Z86718 Personal history of other venous thrombosis and embolism: Secondary | ICD-10-CM | POA: Diagnosis not present

## 2014-10-08 DIAGNOSIS — Z89412 Acquired absence of left great toe: Secondary | ICD-10-CM | POA: Insufficient documentation

## 2014-10-08 DIAGNOSIS — Z7952 Long term (current) use of systemic steroids: Secondary | ICD-10-CM | POA: Diagnosis not present

## 2014-10-08 DIAGNOSIS — Z7902 Long term (current) use of antithrombotics/antiplatelets: Secondary | ICD-10-CM | POA: Insufficient documentation

## 2014-10-08 DIAGNOSIS — D631 Anemia in chronic kidney disease: Secondary | ICD-10-CM | POA: Diagnosis not present

## 2014-10-08 DIAGNOSIS — M1990 Unspecified osteoarthritis, unspecified site: Secondary | ICD-10-CM | POA: Diagnosis not present

## 2014-10-08 DIAGNOSIS — Z87891 Personal history of nicotine dependence: Secondary | ICD-10-CM | POA: Diagnosis not present

## 2014-10-08 DIAGNOSIS — Z9889 Other specified postprocedural states: Secondary | ICD-10-CM | POA: Diagnosis not present

## 2014-10-08 DIAGNOSIS — N186 End stage renal disease: Secondary | ICD-10-CM | POA: Insufficient documentation

## 2014-10-08 DIAGNOSIS — I5032 Chronic diastolic (congestive) heart failure: Secondary | ICD-10-CM | POA: Insufficient documentation

## 2014-10-08 DIAGNOSIS — Z4800 Encounter for change or removal of nonsurgical wound dressing: Secondary | ICD-10-CM | POA: Diagnosis present

## 2014-10-08 NOTE — ED Notes (Signed)
Pt states he needs a wound check. Bandages changed Thursday and would like wound assessment. Drainage on R foot.

## 2014-10-08 NOTE — ED Notes (Signed)
Bulky dressing applied to both feet.

## 2014-10-08 NOTE — ED Provider Notes (Signed)
TIME SEEN: 2:15 PM  CHIEF COMPLAINT: Wound check  HPI: Pt is a 54 y.o. male with history of end-stage renal disease on hemodialysis, hypertension, CAD, CHF, prior DVT, peripheral vascular disease with recent amputation of the left great toe on 09/12/14 with Dr. Trula Slade and dry gangrene of the right foot who presents emergency department for wound check. Reports that he has had some foul-smelling drainage. No fevers or chills. No nausea, vomiting or diarrhea. Does not feel that his foot looks any different. Has an appointment with his vascular surgeon tomorrow.  ROS: See HPI Constitutional: no fever  Eyes: no drainage  ENT: no runny nose   Cardiovascular:  no chest pain  Resp: no SOB  GI: no vomiting GU: no dysuria Integumentary: no rash  Allergy: no hives  Musculoskeletal: no leg swelling  Neurological: no slurred speech ROS otherwise negative  PAST MEDICAL HISTORY/PAST SURGICAL HISTORY:  Past Medical History  Diagnosis Date  . ESRD on hemodialysis     a. ESRD 2/2 HTN with renal transplant in 1987 (cadaveric) after short period of dialysis;  b. Transplant failed in 2004 and he went back on HD;  c. As of 10/15 getting HD via L thigh AVG on a TTS schedule at [facility] on [facility].  . Hypertension   . Hx of kidney transplant     a. 1987-> back on HD since 2004  . Gout tophi   . Chronic steroid use     a. Has severe gout. Did not tolerate Allopurinol. Do not taper per PCP   . Systolic CHF, chronic     2 D echo 04/2012 with EF of 45 %   . Malnutrition   . Hx SBO 04/2012    a. 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 F/U with Dr Donne Hazel. Started on TNA  . Anemia associated with chronic renal failure   . Hepatitis C   . GERD (gastroesophageal reflux disease)   . H/O hiatal hernia   . Arthritis   . History of DVT (deep vein thrombosis)   . Peripheral vascular disease     a. 12/2013 PTA of L Pop;  b. 12/2013  PTA R pop, R DP;  c. 06/2014 L Pop CBA/DCB PTA.  . Prostate cancer   . CAD (coronary artery disease)     a. 12/2013 Cath/PCI: EF 45-50%, LM Ca2+, LAD 30-40p, 3m, D1/2/3 min irregs, LCX 50ost, 30-76m, RCA 70p, 95/42m (Rota->3.0x23 Xience distal, 3.0x23 Xience mid, 3.0x28 Xience prox).  . Chronic diastolic CHF (congestive heart failure)     a. 12/2013 Echo: EF 55-60%, mild LVH, nl wall motion, Gr 2 DD.  . Dry gangrene     a. L great toe    MEDICATIONS:  Prior to Admission medications   Medication Sig Start Date End Date Taking? Authorizing Provider  amLODipine (NORVASC) 5 MG tablet Take 2 tablets (10 mg total) by mouth daily. 09/05/14   Shanker Kristeen Mans, MD  atorvastatin (LIPITOR) 40 MG tablet TAKE 1 TABLET BY MOUTH DAILY AT 6 PM 09/05/14   Shanker Kristeen Mans, MD  calcium acetate (PHOSLO) 667 MG tablet Take 3 tablets by mouth 3 (three) times daily. 09/05/14   Shanker Kristeen Mans, MD  carvedilol (COREG) 6.25 MG tablet Take 1 tablet (6.25 mg total) by mouth 2 (two) times daily with a meal. 09/05/14   Jonetta Osgood, MD  cinacalcet (SENSIPAR) 60 MG tablet Take 1 tablet (  60 mg total) by mouth daily. 09/05/14   Shanker Kristeen Mans, MD  clopidogrel (PLAVIX) 75 MG tablet Take 75 mg by mouth daily.  09/07/14   Historical Provider, MD  colchicine (COLCRYS) 0.6 MG tablet Take 0.5 tablets (0.3 mg total) by mouth 2 (two) times a week. On Monday and Thursday 09/05/14   Jonetta Osgood, MD  diphenhydrAMINE (BENADRYL) 25 MG tablet Take 1 tablet (25 mg total) by mouth every 6 (six) hours as needed. 09/24/14   Tiffany Marilu Favre, PA-C  HYDROmorphone (DILAUDID) 2 MG tablet Take 2 mg by mouth every 6 (six) hours as needed for severe pain.  08/07/14   Historical Provider, MD  metoCLOPramide (REGLAN) 10 MG tablet Take 1 tablet (10 mg total) by mouth every 8 (eight) hours as needed for nausea or vomiting. 09/05/14   Jonetta Osgood, MD  multivitamin (RENA-VIT) TABS tablet Take 1 tablet by mouth daily. 09/05/14    Shanker Kristeen Mans, MD  Nutritional Supplements (FEEDING SUPPLEMENT, NEPRO CARB STEADY,) LIQD Take 237 mLs by mouth as needed (missed meal during dialysis.). Patient not taking: Reported on 09/08/2014 09/05/14   Jonetta Osgood, MD  oxyCODONE (ROXICODONE) 5 MG immediate release tablet Take 1-2 tablets (5-10 mg total) by mouth every 6 (six) hours as needed for severe pain. 09/05/14   Shanker Kristeen Mans, MD  pantoprazole (PROTONIX) 40 MG tablet Take 1 tablet (40 mg total) by mouth daily. 09/05/14   Shanker Kristeen Mans, MD  polyethylene glycol (MIRALAX / GLYCOLAX) packet Take 17 g by mouth daily. 09/05/14   Shanker Kristeen Mans, MD  predniSONE (DELTASONE) 10 MG tablet Take 2 tablets (20 mg total) by mouth daily. 09/24/14   Tiffany Marilu Favre, PA-C  predniSONE (DELTASONE) 5 MG tablet Take 1 tablet (5 mg total) by mouth daily. 09/05/14   Shanker Kristeen Mans, MD  senna-docusate (SENOKOT-S) 8.6-50 MG per tablet Take 1 tablet by mouth 2 (two) times daily as needed for mild constipation. 09/05/14   Shanker Kristeen Mans, MD  simethicone (MYLICON) 40 NG/2.9BM drops Take 0.6 mLs (40 mg total) by mouth 4 (four) times daily as needed for flatulence. Patient not taking: Reported on 09/08/2014 09/05/14   Jonetta Osgood, MD    ALLERGIES:  Allergies  Allergen Reactions  . Allopurinol Other (See Comments)    REACTION: decreased platelets    SOCIAL HISTORY:  History  Substance Use Topics  . Smoking status: Former Smoker -- 1.00 packs/day for 10 years    Types: Cigarettes    Quit date: 03/23/1973  . Smokeless tobacco: Never Used     Comment: Quit 30 years ago  . Alcohol Use: No    FAMILY HISTORY: Family History  Problem Relation Age of Onset  . Hypertension Father   . Pneumonia Brother   . Lung disease Brother     EXAM: BP 153/79 mmHg  Pulse 88  Temp(Src) 98 F (36.7 C) (Oral)  Resp 19  SpO2 100% CONSTITUTIONAL: Alert and oriented and responds appropriately to questions. Well-appearing;  well-nourished HEAD: Normocephalic EYES: Conjunctivae clear, PERRL ENT: normal nose; no rhinorrhea; moist mucous membranes; pharynx without lesions noted NECK: Supple, no meningismus, no LAD  CARD: RRR; S1 and S2 appreciated; no murmurs, no clicks, no rubs, no gallops RESP: Normal chest excursion without splinting or tachypnea; breath sounds clear and equal bilaterally; no wheezes, no rhonchi, no rales,  ABD/GI: Normal bowel sounds; non-distended; soft, non-tender, no rebound, no guarding BACK:  The back appears normal and is non-tender to  palpation, there is no CVA tenderness EXT: Patient is status post left great toe amputation and the incision is clean and dry and intact with no surrounding erythema or warmth, no drainage, patient has dry gangrene of all of the toes on the right side, there is a small open area between the first and second toe that has been fever red tissue and some granulation tissue without any obvious drainage, no surrounding erythema or warmth, no fluctuance or induration, patient does have warm feet bilaterally and 2+ femoral pulses bilaterally, no joint effusion, no calf tenderness or swelling, Normal ROM in all joints; extremities are non-tender to palpation; no edema; normal capillary refill; no cyanosis, no significant odor SKIN: Normal color for age and race; warm NEURO: Moves all extremities equally PSYCH: The patient's mood and manner are appropriate. Grooming and personal hygiene are appropriate.  MEDICAL DECISION MAKING: Patient here with chronic wounds on his lower extremities, right dry gangrene. All likely secondary to peripheral vascular disease. He states he is here because he has noticed an open area between the first and second toe and some foul-smelling drainage. Patient at this time looks like it is actually healing well with good granulation tissue and no obvious sign of infection. I do not feel this time needs to be started on antibiotics. He is  hemodynamically stable, afebrile with no systemic symptoms. He has follow-up with his vascular surgeon tomorrow. He has a home health nurse who comes to dress his wounds weekly. Last his dressings changed 3 days ago and they are clean and dry. Discussed return precautions. He verbalizes understanding and is comfortable with plan.       [place], DO 10/08/14 1557

## 2014-10-08 NOTE — Discharge Instructions (Signed)
Your foot does not show any obvious sign of infection today. Given you have a follow-up appointment with your vascular surgeon tomorrow I suggest you address these issues with him tomorrow. I do not feel at this time we need to start you on antibiotics.   Gangrene Gangrene is the decay or death of an organ or tissue caused by loss of blood supply. It may be caused by infectious or inflammatory processes. Or it may be caused by degenerative changes in long-standing diseases such as diabetes. It may also occur following injuries or surgical procedures. TYPES OF GANGRENE There are three major types of gangrene: dry, moist, and gaseous (a type of moist gangrene).  Dry gangrene is a condition that results when one or more arteries become blocked. Arteries are muscular vessels which carry blood away from the heart and around the body. In this type of gangrene, the tissue slowly dies, due to loss of blood supply. But it does not become infected. The affected area becomes cold and black. It begins to dry out and wither. Eventually it drops off over a period of weeks or months. Dry gangrene is most common in people with advanced blockages of the arteries (arteriosclerosis or hardening of the arteries) resulting from diabetes.  Moist gangrene occurs in the toes, feet, or legs after a crushing injury. Or it can be a result of some other problem that causes blood flow in an area to suddenly stop. When blood flow stops, bacteria begin to invade the muscle and grow. It multiplies quickly, without the body's immune system stopping it.  Gaseous gangrene is also called muscle death (myonecrosis). It is a type of moist gangrene commonly caused by a bacterial infection. This is usually caused by a bacteria which can live in an area of little or no oxygen. Once present in tissue, these bacteria produce gases and poisonous toxins as they grow. These bacteria are normally found in the gut, respiratory, and male urinary  tract. They often infect thigh amputation wounds commonly in people who have lost control of their bowel functions (incontinence). Gangrene, incontinence, and weakness often are found in patients with diabetes. It is in the amputation stump of diabetic patients that gaseous gangrene often happens. The most common bacteria to cause gaseous gangrene is clostridia. Other bacteria which cause moist gangrene include streptococcus and staphylococcus. A serious, but rare form of infection with group A streptococci can slow blood flow. If untreated, it can progress to gangrene. This is more commonly called necrotizing fasciitis. This is an infection of the skin and tissues directly beneath the skin. This is sometimes called the "flesh-eating bacteria". It is called this because it can rapidly kill large areas of flesh in the body. It requires immediate surgical treatment or it will result in death. CAUSES   Some illnesses can cause gangrene because the vessels carrying the blood are already diseased. Examples are:  Long-standing illnesses, such as diabetes mellitus or arteriosclerosis.  Other diseases affecting the blood vessels, such as Buerger disease or Raynaud disease. Other causes of gangrene include:   Open bone breaks.  Burns.  Injections given under the skin or in a muscle.  Gangrene may occur following surgery, particularly in individuals with diabetes or other long-term (chronic) disease.  Gas gangrene can be also a complication of dry gangrene. Or it can happen suddenly along with an underlying cancer. Moist gangrene and gas gangrene are different. Gas gangrene usually involves only the muscle. It does not involve the skin as much.  In moist or gas gangrene, there is a feeling of heaviness in the affected area that is followed by severe pain. The pain is caused by swelling from fluid or gas accumulation in the tissues. This pain is the worst, on average, between 1 and 4 days following the  injury. It can range from several hours to several weeks. The swollen skin may at first be blistered, red, and warm to the touch. Then it changes to a bronze, brown, or black color. In the majority of cases, the affected and surrounding tissues may give off crackling sounds (crepitus). This is a result of gas bubbles accumulating under the skin. The gas may be felt beneath the skin. In wet gangrene, the pus is foul-smelling. In gas gangrene, there is no true pus, just an almost "sweet" smelling watery discharge. If the bacterial toxins spread to the bloodstream, the following may happen:  A higher than normal temperature.  Fast heart rate and breathing.  Changed mental state.  Loss of appetite.  Diarrhea.  Vomiting.  Shock. SYMPTOMS  Areas of either dry or moist gangrene are first noticed as a red line on the skin that marks the border of the affected tissues.  As tissues begin to die, dry gangrene may cause pain in the early stages. Or it may go unnoticed.  This happens especially in the elderly or in individuals with decreased ability to feel pain.  At first, the area becomes cold, numb, and pale. Later its color changes to brown, then black. This dead tissue will gradually separate from the healthy tissue and fall off. DIAGNOSIS  Diagnosis of gangrene is based on patient history, physical examination, and results of blood and other lab tests.  A caregiver will look for a history of:  Recent damage caused by an accident (trauma).  Surgery.  Cancer.  Chronic disease.  Blood tests can be used to find out if infection is present and find how much it has spread.  Drainage from a wound, or a tissue sample obtained through surgical exploration, may be cultured to:  Identify the bacteria causing the infection.  Help in figuring out which antibiotic will be most effective.  Gas accumulation and muscle death (myonecrosis) may not be visible. So X-ray studies and more  sophisticated imaging techniques may be helpful in making a diagnosis. They include:  Computed tomography (CT) scans.  Magnetic resonance imaging (MRI).  These techniques are not sufficient alone to provide an accurate diagnosis of gangrene, though.  Precise diagnosis of gas gangrene often requires surgical exploration of the wound.  During such a procedure, the exposed muscle may appear pale, beefy-red. In the most advanced stages, it may be black. If infected, the muscle will fail to contract with stimulation, and the cut surface will not bleed. TREATMENT  Gaseous gangrene is a medical emergency. It requires immediate surgery and antibiotics. If the infection spreads to the bloodstream and infects vital organs, it can rapidly result in death.   Areas of dry gangrene that remain free from infection (aseptic) in the arms or hands and legs or feet (extremities) are often left to wither and fall off. Treatments applied to the wound on the surface are generally not effective without enough blood supply to support wound healing.  Evaluation by a vascular surgeon, along with X-rays to determine blood supply and circulation to the affected area, can help figure out if surgery would be helpful.  Once the cause of infection is found, moist gangrene requires immediate intravenous, intramuscular,  or topical broad-spectrum antibiotic therapy. Also, the infected tissue must be removed surgically. Amputation of the affected limb may be needed. Pain medicines (analgesics) are prescribed for pain. Fluids injected into the veins (intravenous fluids) and, occasionally, blood transfusions are used to treat shock and replenish red blood cells and electrolytes. Plenty of water and healthy foods are needed for wound healing.  Some cases of gangrene are treated by giving oxygen under pressure greater than that of the atmosphere (hyperbaric) to the patient. This happens in a specially designed chamber. The idea behind  using hyperbaric oxygen is that more oxygen will become dissolved in the patient's bloodstream. So more oxygen will be delivered to the gangrenous areas. By providing the right amount of oxygenation, the body's ability to fight off the bacterial infection is believed to be improved. There is a direct harmful effect on the bacteria that thrive in an oxygen-free environment. Some studies have shown that the use of hyperbaric oxygen relieves pain, reduces the number of amputations required, and reduces the extent of surgical debridement (removal of dead or damaged tissue).  The emotional needs of the patient must also be met. The individual with gangrene should be offered moral support, along with an opportunity to share questions and concerns. In addition, particularly in cases where amputation was required; physical, vocational, and rehabilitation therapy will also be required. Except in cases where the infection has spread to the bloodstream, the result of treated gangrene is usually favorable.  PREVENTION  Patients with diabetes or severe arteriosclerosis should take special care of their hands and feet. This is due to the risk of infection associated with even minor injuries. Education about good foot care is important. Poor blood flow as a result diseased blood vessels will lessen the body's defenses against invading germs. Your caregiver will take steps to improve circulation whenever possible. All injuries to skin or tissue should be cared for immediately. Dying or infected skin should be removed immediately. This will prevent the spread of bacteria. Wounds into the abdomen should be surgically treated and damage repaired early. This should be followed up with antibiotic treatment. Patients undergoing non-emergency (elective) intestinal surgery may receive preventive antibiotic therapy. Use of antibiotics prior to and directly following surgery has been shown to significantly reduce the rate of infection.    SEEK IMMEDIATE MEDICAL CARE IF:  You show signs of gaseous gangrene. MAKE SURE YOU:   Understand these instructions.  Will watch your condition.  Will get help right away if you are not doing well or get worse. Document Released: 07/10/2004 Document Revised: 03/09/2012 Document Reviewed: 11/02/2013 [facility] Patient Information 2015 [place], [place]. This information is not intended to replace advice given to you by your health care provider. Make sure you discuss any questions you have with your health care provider.

## 2014-10-08 NOTE — ED Notes (Signed)
Bed: WA08 Expected date: 10/08/14 Expected time: 1:28 PM Means of arrival:  Comments: Foot wounds

## 2014-10-09 ENCOUNTER — Ambulatory Visit (INDEPENDENT_AMBULATORY_CARE_PROVIDER_SITE_OTHER): Payer: Self-pay | Admitting: Surgery

## 2014-10-09 ENCOUNTER — Encounter: Payer: Self-pay | Admitting: Surgery

## 2014-10-09 VITALS — BP 170/69 | HR 88 | Temp 97.8°F | Ht 72.0 in | Wt 103.0 lb

## 2014-10-09 DIAGNOSIS — I70299 Other atherosclerosis of native arteries of extremities, unspecified extremity: Secondary | ICD-10-CM

## 2014-10-09 DIAGNOSIS — L97909 Non-pressure chronic ulcer of unspecified part of unspecified lower leg with unspecified severity: Secondary | ICD-10-CM

## 2014-10-09 NOTE — Progress Notes (Signed)
The patient is back today for followup. He initially presented with bilateral lower extremity ulcers on both great toes. On 01/03/2014, he underwent atherectomy and angioplasty of the left popliteal artery. On 01/11/2014, he underwent atherectomy with drug coded balloon angioplasty of the right popliteal artery as well as angioplasty of the right dorsalis pedis artery. After each procedure the patient developed chest pain. He ultimately underwent coronary stenting.    He had a recurrence of his stenosis and underwent repeat drug coated balloon angioplasty of the left leg on 07/19/2014 and of the right leg on 08/02/2014. Unfortunately, he went on to progress 2 right popliteal occlusion several days to a week or so after his intervention. Therefore on 08/18/2014 he underwent a right femoral to below knee popliteal artery bypass graft with 6 mm PTFE. He ultimately had amputation of his left great toe on 09/12/2014.     The patient has had significant issues with GI bleeding , contributing to a prolonged hospitalization.  He went to the ER yesterday with drainage from his right foot.  On examination he has gangrenous changes to all 5 toes on the right with significant drainage extending onto the plantar aspect of his foot.  I discussed with the patient that I think he needs a transmetatarsal amputation.  I am not available to do this this week, and therefore I have contacted Dr. Sharol Given who has been gracious enough to see him and evaluate him for a TMA.   The sutures of his left great toe amputation site will be removed today.

## 2014-10-10 ENCOUNTER — Other Ambulatory Visit (HOSPITAL_COMMUNITY): Payer: Self-pay | Admitting: Orthopedic Surgery

## 2014-10-12 ENCOUNTER — Encounter (HOSPITAL_COMMUNITY): Payer: Self-pay | Admitting: *Deleted

## 2014-10-12 MED ORDER — CEFAZOLIN SODIUM-DEXTROSE 2-3 GM-% IV SOLR
2.0000 g | INTRAVENOUS | Status: AC
  Administered 2014-10-13: 2 g via INTRAVENOUS
  Filled 2014-10-12: qty 50

## 2014-10-12 NOTE — Progress Notes (Signed)
Pt states he's not been taking his Carvedilol, Amlodipine, Aspirin and Plavix since his last surgery in December because he was told to stop his blood thinners and he wasn't sure which were blood thinners so he stopped all of these. He states no one told him when to start taking them again. Instructed him to start taking his Amlodipine and Carvedilol as directed. He states his BP has been up recently. He voiced understanding.

## 2014-10-12 NOTE — Progress Notes (Signed)
   10/12/14 1726  OBSTRUCTIVE SLEEP APNEA  Have you ever been diagnosed with sleep apnea through a sleep study? No  Do you snore loudly (loud enough to be heard through closed doors)?  0  Do you often feel tired, fatigued, or sleepy during the daytime? 1  Has anyone observed you stop breathing during your sleep? 0  Do you have, or are you being treated for high blood pressure? 1  BMI more than 35 kg/m2? 0  Age over 56 years old? 1  Neck circumference greater than 40 cm/16 inches? 0  Gender: 1  Obstructive Sleep Apnea Score 4

## 2014-10-13 ENCOUNTER — Inpatient Hospital Stay (HOSPITAL_COMMUNITY): Payer: Medicare Other | Admitting: Anesthesiology

## 2014-10-13 ENCOUNTER — Encounter (HOSPITAL_COMMUNITY): Payer: Self-pay | Admitting: Anesthesiology

## 2014-10-13 ENCOUNTER — Inpatient Hospital Stay (HOSPITAL_COMMUNITY)
Admission: RE | Admit: 2014-10-13 | Discharge: 2014-10-18 | DRG: 239 | Disposition: A | Discharge status: Skilled Nursing Facility | Payer: Medicare Other | Source: Ambulatory Visit | Attending: Orthopedic Surgery | Admitting: Orthopedic Surgery

## 2014-10-13 ENCOUNTER — Encounter (HOSPITAL_COMMUNITY)
Admission: RE | Disposition: A | Discharge status: Skilled Nursing Facility | Payer: Self-pay | Source: Ambulatory Visit | Attending: Orthopedic Surgery

## 2014-10-13 DIAGNOSIS — L97519 Non-pressure chronic ulcer of other part of right foot with unspecified severity: Secondary | ICD-10-CM | POA: Diagnosis present

## 2014-10-13 DIAGNOSIS — Z8619 Personal history of other infectious and parasitic diseases: Secondary | ICD-10-CM

## 2014-10-13 DIAGNOSIS — D631 Anemia in chronic kidney disease: Secondary | ICD-10-CM | POA: Diagnosis present

## 2014-10-13 DIAGNOSIS — Z87891 Personal history of nicotine dependence: Secondary | ICD-10-CM

## 2014-10-13 DIAGNOSIS — Z681 Body mass index (BMI) 19 or less, adult: Secondary | ICD-10-CM

## 2014-10-13 DIAGNOSIS — I96 Gangrene, not elsewhere classified: Secondary | ICD-10-CM | POA: Diagnosis present

## 2014-10-13 DIAGNOSIS — N186 End stage renal disease: Secondary | ICD-10-CM | POA: Diagnosis present

## 2014-10-13 DIAGNOSIS — I70261 Atherosclerosis of native arteries of extremities with gangrene, right leg: Principal | ICD-10-CM | POA: Diagnosis present

## 2014-10-13 DIAGNOSIS — K219 Gastro-esophageal reflux disease without esophagitis: Secondary | ICD-10-CM | POA: Diagnosis present

## 2014-10-13 DIAGNOSIS — M869 Osteomyelitis, unspecified: Secondary | ICD-10-CM | POA: Diagnosis present

## 2014-10-13 DIAGNOSIS — E46 Unspecified protein-calorie malnutrition: Secondary | ICD-10-CM | POA: Diagnosis present

## 2014-10-13 DIAGNOSIS — M109 Gout, unspecified: Secondary | ICD-10-CM | POA: Diagnosis present

## 2014-10-13 DIAGNOSIS — N485 Ulcer of penis: Secondary | ICD-10-CM | POA: Diagnosis present

## 2014-10-13 DIAGNOSIS — Z940 Kidney transplant status: Secondary | ICD-10-CM

## 2014-10-13 DIAGNOSIS — Z86718 Personal history of other venous thrombosis and embolism: Secondary | ICD-10-CM

## 2014-10-13 DIAGNOSIS — I2510 Atherosclerotic heart disease of native coronary artery without angina pectoris: Secondary | ICD-10-CM | POA: Diagnosis present

## 2014-10-13 DIAGNOSIS — Z7952 Long term (current) use of systemic steroids: Secondary | ICD-10-CM | POA: Diagnosis not present

## 2014-10-13 DIAGNOSIS — I5042 Chronic combined systolic (congestive) and diastolic (congestive) heart failure: Secondary | ICD-10-CM | POA: Diagnosis present

## 2014-10-13 DIAGNOSIS — N2581 Secondary hyperparathyroidism of renal origin: Secondary | ICD-10-CM | POA: Diagnosis present

## 2014-10-13 DIAGNOSIS — Z992 Dependence on renal dialysis: Secondary | ICD-10-CM

## 2014-10-13 DIAGNOSIS — I120 Hypertensive chronic kidney disease with stage 5 chronic kidney disease or end stage renal disease: Secondary | ICD-10-CM | POA: Diagnosis present

## 2014-10-13 HISTORY — PX: AMPUTATION: SHX166

## 2014-10-13 SURGERY — AMPUTATION, FOOT, PARTIAL
Anesthesia: General | Site: Foot | Laterality: Right

## 2014-10-13 MED ORDER — HYDROMORPHONE HCL 2 MG PO TABS
2.0000 mg | ORAL_TABLET | Freq: Four times a day (QID) | ORAL | Status: DC | PRN
Start: 2014-10-13 — End: 2014-10-18
  Administered 2014-10-14 – 2014-10-18 (×3): 2 mg via ORAL
  Filled 2014-10-13 (×4): qty 1

## 2014-10-13 MED ORDER — FENTANYL CITRATE 0.05 MG/ML IJ SOLN
INTRAMUSCULAR | Status: AC
  Filled 2014-10-13: qty 5

## 2014-10-13 MED ORDER — MEPERIDINE HCL 25 MG/ML IJ SOLN: 6.2500 mg | INTRAMUSCULAR | Status: DC | PRN

## 2014-10-13 MED ORDER — PROPOFOL 10 MG/ML IV BOLUS
INTRAVENOUS | Status: AC
  Filled 2014-10-13: qty 20

## 2014-10-13 MED ORDER — FENTANYL CITRATE 0.05 MG/ML IJ SOLN
INTRAMUSCULAR | Status: DC | PRN
  Administered 2014-10-13 (×2): 50 ug via INTRAVENOUS

## 2014-10-13 MED ORDER — COLCHICINE 0.6 MG PO TABS
0.3000 mg | ORAL_TABLET | ORAL | Status: DC
  Filled 2014-10-13 (×2): qty 0.5

## 2014-10-13 MED ORDER — NEPRO/CARBSTEADY PO LIQD: 237.0000 mL | ORAL | Status: DC | PRN

## 2014-10-13 MED ORDER — MAGNESIUM CITRATE PO SOLN
1.0000 | Freq: Once | ORAL | Status: AC | PRN
  Filled 2014-10-13: qty 296

## 2014-10-13 MED ORDER — METHOCARBAMOL 500 MG PO TABS
500.0000 mg | ORAL_TABLET | Freq: Four times a day (QID) | ORAL | Status: DC | PRN
  Administered 2014-10-14 – 2014-10-18 (×6): 500 mg via ORAL
  Filled 2014-10-13 (×7): qty 1

## 2014-10-13 MED ORDER — RENA-VITE PO TABS
1.0000 | ORAL_TABLET | Freq: Every day | ORAL | Status: DC
  Administered 2014-10-14 – 2014-10-18 (×5): 1 via ORAL
  Filled 2014-10-13 (×5): qty 1

## 2014-10-13 MED ORDER — FENTANYL CITRATE 0.05 MG/ML IJ SOLN
25.0000 ug | INTRAMUSCULAR | Status: DC | PRN
  Administered 2014-10-13 (×2): 50 ug via INTRAVENOUS

## 2014-10-13 MED ORDER — AMLODIPINE BESYLATE 10 MG PO TABS
10.0000 mg | ORAL_TABLET | Freq: Every day | ORAL | Status: DC
  Administered 2014-10-14 – 2014-10-17 (×4): 10 mg via ORAL
  Filled 2014-10-13 (×5): qty 1

## 2014-10-13 MED ORDER — PREDNISONE 5 MG PO TABS: 5.0000 mg | ORAL_TABLET | Freq: Every day | ORAL | Status: DC

## 2014-10-13 MED ORDER — PANTOPRAZOLE SODIUM 40 MG PO TBEC
40.0000 mg | DELAYED_RELEASE_TABLET | Freq: Every day | ORAL | Status: DC
  Administered 2014-10-14 – 2014-10-18 (×5): 40 mg via ORAL
  Filled 2014-10-13 (×4): qty 1

## 2014-10-13 MED ORDER — PREDNISONE 20 MG PO TABS
20.0000 mg | ORAL_TABLET | Freq: Every day | ORAL | Status: DC
  Administered 2014-10-13 – 2014-10-18 (×6): 20 mg via ORAL
  Filled 2014-10-13 (×6): qty 1

## 2014-10-13 MED ORDER — DOCUSATE SODIUM 100 MG PO CAPS
100.0000 mg | ORAL_CAPSULE | Freq: Two times a day (BID) | ORAL | Status: DC
  Administered 2014-10-13 – 2014-10-18 (×8): 100 mg via ORAL
  Filled 2014-10-13 (×12): qty 1

## 2014-10-13 MED ORDER — MIDAZOLAM HCL 5 MG/5ML IJ SOLN
INTRAMUSCULAR | Status: DC | PRN
  Administered 2014-10-13 (×2): 0.5 mg via INTRAVENOUS

## 2014-10-13 MED ORDER — CALCIUM ACETATE (PHOS BINDER) 667 MG PO TABS
2001.0000 mg | ORAL_TABLET | Freq: Three times a day (TID) | ORAL | Status: DC
  Administered 2014-10-13: 3 via ORAL
  Filled 2014-10-13 (×4): qty 3

## 2014-10-13 MED ORDER — 0.9 % SODIUM CHLORIDE (POUR BTL) OPTIME
TOPICAL | Status: DC | PRN
  Administered 2014-10-13: 1000 mL

## 2014-10-13 MED ORDER — OXYCODONE-ACETAMINOPHEN 5-325 MG PO TABS
1.0000 | ORAL_TABLET | ORAL | Status: DC | PRN
  Administered 2014-10-14 – 2014-10-15 (×3): 2 via ORAL
  Filled 2014-10-13: qty 1
  Filled 2014-10-13: qty 2
  Filled 2014-10-13: qty 1
  Filled 2014-10-13: qty 2

## 2014-10-13 MED ORDER — FENTANYL CITRATE 0.05 MG/ML IJ SOLN
INTRAMUSCULAR | Status: AC
  Filled 2014-10-13: qty 2

## 2014-10-13 MED ORDER — CEFAZOLIN SODIUM 1-5 GM-% IV SOLN
1.0000 g | Freq: Four times a day (QID) | INTRAVENOUS | Status: AC
  Administered 2014-10-13 – 2014-10-14 (×3): 1 g via INTRAVENOUS
  Filled 2014-10-13 (×3): qty 50

## 2014-10-13 MED ORDER — METOCLOPRAMIDE HCL 5 MG/ML IJ SOLN
5.0000 mg | Freq: Three times a day (TID) | INTRAMUSCULAR | Status: DC | PRN
Start: 2014-10-13 — End: 2014-10-18

## 2014-10-13 MED ORDER — CLOPIDOGREL BISULFATE 75 MG PO TABS
75.0000 mg | ORAL_TABLET | Freq: Every day | ORAL | Status: DC
  Administered 2014-10-14 – 2014-10-18 (×5): 75 mg via ORAL
  Filled 2014-10-13 (×6): qty 1

## 2014-10-13 MED ORDER — ONDANSETRON HCL 4 MG/2ML IJ SOLN
INTRAMUSCULAR | Status: DC | PRN
  Administered 2014-10-13: 4 mg via INTRAVENOUS

## 2014-10-13 MED ORDER — PROPOFOL 10 MG/ML IV BOLUS
INTRAVENOUS | Status: AC
  Filled 2014-10-13: qty 40

## 2014-10-13 MED ORDER — BISACODYL 5 MG PO TBEC: 5.0000 mg | DELAYED_RELEASE_TABLET | Freq: Every day | ORAL | Status: DC | PRN

## 2014-10-13 MED ORDER — SODIUM CHLORIDE 0.9 % IV SOLN
INTRAVENOUS | Status: DC
  Administered 2014-10-13: 14:00:00 via INTRAVENOUS
  Administered 2014-10-13: 10 mL/h via INTRAVENOUS
  Administered 2014-10-13: 16:00:00 via INTRAVENOUS

## 2014-10-13 MED ORDER — ONDANSETRON HCL 4 MG PO TABS: 4.0000 mg | ORAL_TABLET | Freq: Four times a day (QID) | ORAL | Status: DC | PRN

## 2014-10-13 MED ORDER — SENNOSIDES-DOCUSATE SODIUM 8.6-50 MG PO TABS
1.0000 | ORAL_TABLET | Freq: Every evening | ORAL | Status: DC | PRN
Start: 2014-10-13 — End: 2014-10-18

## 2014-10-13 MED ORDER — MIDAZOLAM HCL 2 MG/2ML IJ SOLN
INTRAMUSCULAR | Status: AC
  Filled 2014-10-13: qty 2

## 2014-10-13 MED ORDER — DEXTROSE 5 % IV SOLN
INTRAVENOUS | Status: DC | PRN
  Administered 2014-10-13: 16:00:00 via INTRAVENOUS

## 2014-10-13 MED ORDER — PROMETHAZINE HCL 25 MG/ML IJ SOLN: 6.2500 mg | INTRAMUSCULAR | Status: DC | PRN

## 2014-10-13 MED ORDER — HYDROMORPHONE HCL 1 MG/ML IJ SOLN
0.5000 mg | INTRAMUSCULAR | Status: DC | PRN
  Administered 2014-10-13 – 2014-10-17 (×14): 1 mg via INTRAVENOUS
  Filled 2014-10-13 (×14): qty 1

## 2014-10-13 MED ORDER — SODIUM CHLORIDE 0.9 % IV SOLN: INTRAVENOUS | Status: DC

## 2014-10-13 MED ORDER — METOCLOPRAMIDE HCL 10 MG PO TABS: 10.0000 mg | ORAL_TABLET | Freq: Three times a day (TID) | ORAL | Status: DC | PRN

## 2014-10-13 MED ORDER — PROPOFOL 10 MG/ML IV BOLUS
INTRAVENOUS | Status: DC | PRN
  Administered 2014-10-13: 150 mg via INTRAVENOUS

## 2014-10-13 MED ORDER — METOCLOPRAMIDE HCL 5 MG PO TABS: 5.0000 mg | ORAL_TABLET | Freq: Three times a day (TID) | ORAL | Status: DC | PRN

## 2014-10-13 MED ORDER — CINACALCET HCL 30 MG PO TABS
60.0000 mg | ORAL_TABLET | Freq: Every day | ORAL | Status: DC
  Administered 2014-10-13 – 2014-10-17 (×5): 60 mg via ORAL
  Filled 2014-10-13 (×6): qty 2

## 2014-10-13 MED ORDER — CARVEDILOL 6.25 MG PO TABS
6.2500 mg | ORAL_TABLET | Freq: Two times a day (BID) | ORAL | Status: DC
  Administered 2014-10-14 – 2014-10-18 (×7): 6.25 mg via ORAL
  Filled 2014-10-13 (×12): qty 1

## 2014-10-13 MED ORDER — METHOCARBAMOL 1000 MG/10ML IJ SOLN
500.0000 mg | Freq: Four times a day (QID) | INTRAVENOUS | Status: DC | PRN
  Filled 2014-10-13: qty 5

## 2014-10-13 MED ORDER — ONDANSETRON HCL 4 MG/2ML IJ SOLN: 4.0000 mg | Freq: Four times a day (QID) | INTRAMUSCULAR | Status: DC | PRN

## 2014-10-13 MED ORDER — ATORVASTATIN CALCIUM 40 MG PO TABS
40.0000 mg | ORAL_TABLET | Freq: Every day | ORAL | Status: DC
  Administered 2014-10-13 – 2014-10-17 (×5): 40 mg via ORAL
  Filled 2014-10-13 (×6): qty 1

## 2014-10-13 MED ORDER — OXYCODONE HCL 5 MG PO TABS
5.0000 mg | ORAL_TABLET | Freq: Four times a day (QID) | ORAL | Status: DC | PRN
  Administered 2014-10-17 – 2014-10-18 (×2): 10 mg via ORAL
  Filled 2014-10-13 (×2): qty 2

## 2014-10-13 SURGICAL SUPPLY — 30 items
BLADE OSCILLATING /SAGITTAL (BLADE) ×2 IMPLANT
BLADE SAW SGTL HD 18.5X60.5X1. (BLADE) ×1 IMPLANT
BLADE SURG 10 STRL SS (BLADE) IMPLANT
BNDG COHESIVE 4X5 TAN STRL (GAUZE/BANDAGES/DRESSINGS) ×4 IMPLANT
BNDG COHESIVE 6X5 TAN STRL LF (GAUZE/BANDAGES/DRESSINGS) ×2 IMPLANT
BNDG GAUZE ELAST 4 BULKY (GAUZE/BANDAGES/DRESSINGS) ×4 IMPLANT
COVER SURGICAL LIGHT HANDLE (MISCELLANEOUS) ×3 IMPLANT
DRAPE U-SHAPE 47X51 STRL (DRAPES) ×1 IMPLANT
DRSG ADAPTIC 3X8 NADH LF (GAUZE/BANDAGES/DRESSINGS) ×3 IMPLANT
DRSG PAD ABDOMINAL 8X10 ST (GAUZE/BANDAGES/DRESSINGS) ×3 IMPLANT
DURAPREP 26ML APPLICATOR (WOUND CARE) ×3 IMPLANT
ELECT REM PT RETURN 9FT ADLT (ELECTROSURGICAL) ×3
ELECTRODE REM PT RTRN 9FT ADLT (ELECTROSURGICAL) ×1 IMPLANT
GAUZE SPONGE 4X4 12PLY STRL (GAUZE/BANDAGES/DRESSINGS) ×3 IMPLANT
GLOVE BIOGEL PI IND STRL 9 (GLOVE) ×1 IMPLANT
GLOVE BIOGEL PI INDICATOR 9 (GLOVE) ×2
GLOVE SURG ORTHO 9.0 STRL STRW (GLOVE) ×3 IMPLANT
GOWN STRL REUS W/ TWL XL LVL3 (GOWN DISPOSABLE) ×3 IMPLANT
GOWN STRL REUS W/TWL XL LVL3 (GOWN DISPOSABLE) ×9
KIT BASIN OR (CUSTOM PROCEDURE TRAY) ×3 IMPLANT
KIT ROOM TURNOVER OR (KITS) ×3 IMPLANT
NS IRRIG 1000ML POUR BTL (IV SOLUTION) ×3 IMPLANT
PACK ORTHO EXTREMITY (CUSTOM PROCEDURE TRAY) ×3 IMPLANT
PAD ARMBOARD 7.5X6 YLW CONV (MISCELLANEOUS) ×6 IMPLANT
SPECIMEN JAR MEDIUM (MISCELLANEOUS) ×2 IMPLANT
SPONGE LAP 18X18 X RAY DECT (DISPOSABLE) ×3 IMPLANT
SUT ETHILON 2 0 PSLX (SUTURE) ×7 IMPLANT
SUT VIC AB 2-0 CTB1 (SUTURE) IMPLANT
TOWEL OR 17X24 6PK STRL BLUE (TOWEL DISPOSABLE) ×3 IMPLANT
TOWEL OR 17X26 10 PK STRL BLUE (TOWEL DISPOSABLE) ×3 IMPLANT

## 2014-10-13 NOTE — Op Note (Signed)
10/13/2014  4:14 PM  PATIENT:  [name]    PRE-OPERATIVE DIAGNOSIS:  Gangrene Right Foot  POST-OPERATIVE DIAGNOSIS:  Same  PROCEDURE:  Right Foot Transmetatarsal Amputation  SURGEON:  Newt Minion, MD  PHYSICIAN ASSISTANT:None ANESTHESIA:   General  PREOPERATIVE INDICATIONS:  [name] is a  54 y.o. male with a diagnosis of Gangrene Right Foot who failed conservative measures and elected for surgical management.    The risks benefits and alternatives were discussed with the patient preoperatively including but not limited to the risks of infection, bleeding, nerve injury, cardiopulmonary complications, the need for revision surgery, among others, and the patient was willing to proceed.  OPERATIVE IMPLANTS: none  OPERATIVE FINDINGS: Minimal petechial bleeding  OPERATIVE PROCEDURE: Patient is a 54 year old gentleman with severe peripheral vascular disease with gangrene involving the entire right forefoot he presents at this time for foot salvage for transmetatarsal amputation. Risks and benefits were discussed including need for higher level amputation. Patient states he understands and wishes to proceed at this time. Description of procedure patient was brought to the operating room and underwent a general anesthetic. After adequate levels and anesthesia obtained patient's right lower extremity was prepped using DuraPrep draped into a sterile field. A timeout was called. A fishmouth incision was made through the midfoot approximately centimeter proximal to the viable margin. Patient underwent a transmetatarsal amputation. The wound was irrigated with normal saline. The incision was closed using 2-0 nylon. A sterile dressing was applied. Patient was extubated taken to the PACU in stable condition.

## 2014-10-13 NOTE — Progress Notes (Signed)
Consult placed for dialysis. Patient and family request placement to skilled nursing postoperatively. Anticipate discharge Monday

## 2014-10-13 NOTE — Anesthesia Postprocedure Evaluation (Signed)
Anesthesia Post Note  Patient: [name]  Procedure(s) Performed: Procedure(s) (LRB): Right Foot Transmetatarsal Amputation (Right)  Anesthesia type: General  Patient location: PACU  Post pain: Pain level controlled  Post assessment: Post-op Vital signs reviewed  Last Vitals: BP 159/61 mmHg  Pulse 71  Temp(Src) 36.5 C (Oral)  Resp 10  Wt 103 lb (46.72 kg)  SpO2 100%  Post vital signs: Reviewed  Level of consciousness: sedated  Complications: No apparent anesthesia complications

## 2014-10-13 NOTE — Progress Notes (Signed)
Orthopedic Tech Progress Note Patient Details:  [name] 30-Jun-1961 [id number] Delivered post op shoe to pt.'s room. Ortho Devices Type of Ortho Device: Postop shoe/boot Ortho Device/Splint Location: RLE Ortho Device/Splint Interventions: Other (comment)   Darrol Poke 10/13/2014, 8:05 PM

## 2014-10-13 NOTE — Transfer of Care (Signed)
Immediate Anesthesia Transfer of Care Note  Patient: [name]  Procedure(s) Performed: Procedure(s): Right Foot Transmetatarsal Amputation (Right)  Patient Location: PACU  Anesthesia Type:General  Level of Consciousness: awake, alert , oriented and patient cooperative  Airway & Oxygen Therapy: Patient Spontanous Breathing and Patient connected to nasal cannula oxygen  Post-op Assessment: Report given to PACU RN and Post -op Vital signs reviewed and stable  Post vital signs: Reviewed and stable  Complications: No apparent anesthesia complications

## 2014-10-13 NOTE — H&P (Signed)
[name] is an 54 y.o. male.   Chief Complaint: Gangrene right forefoot HPI: Patient is a 54 year old gentleman with peripheral vascular disease who has undergone revascularization intervention and presents at this time for transmetatarsal amputation on the right with gangrenous changes to the right forefoot.  Past Medical History  Diagnosis Date  . ESRD on hemodialysis     a. ESRD 2/2 HTN with renal transplant in 1987 (cadaveric) after short period of dialysis;  b. Transplant failed in 2004 and he went back on HD;  c. As of 10/15 getting HD via L thigh AVG on a TTS schedule at [facility] on [facility]  . Hypertension   . Hx of kidney transplant     a. 1987-> back on HD since 2004  . Gout tophi   . Chronic steroid use     a. Has severe gout. Did not tolerate Allopurinol. Do not taper per PCP   . Systolic CHF, chronic     2 D echo 04/2012 with EF of 45 %   . Malnutrition   . Hx SBO 04/2012    a. 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 F/U with Dr Donne Hazel. Started on TNA  . Anemia associated with chronic renal failure   . Hepatitis C   . GERD (gastroesophageal reflux disease)   . H/O hiatal hernia   . Arthritis   . History of DVT (deep vein thrombosis)   . Peripheral vascular disease     a. 12/2013 PTA of L Pop;  b. 12/2013 PTA R pop, R DP;  c. 06/2014 L Pop CBA/DCB PTA.  . Prostate cancer   . CAD (coronary artery disease)     a. 12/2013 Cath/PCI: EF 45-50%, LM Ca2+, LAD 30-40p, 81m, D1/2/3 min irregs, LCX 50ost, 30-61m, RCA 70p, 95/22m (Rota->3.0x23 Xience distal, 3.0x23 Xience mid, 3.0x28 Xience prox).  . Chronic diastolic CHF (congestive heart failure)     a. 12/2013 Echo: EF 55-60%, mild LVH, nl wall motion, Gr 2 DD.  . Dry gangrene     a. L great toe    Past Surgical History  Procedure Laterality Date  . Laparotomy  04/14/2012    Procedure: EXPLORATORY LAPAROTOMY;  Surgeon: Joyice Faster. Cornett, MD;   Location: [place];  Service: General;  Laterality: N/A;  . Colon resection  04/14/2012  . Laparotomy  04/22/2012    Procedure: EXPLORATORY LAPAROTOMY;  Surgeon: Adin Hector, MD;  Location: [place];  Service: General;  Laterality: N/A;  lysis of adhesions, omentoplasty, repair small bowel  . Thrombectomy w/ embolectomy  04/27/2012    Procedure: THROMBECTOMY ARTERIOVENOUS GORE-TEX GRAFT;  Surgeon: Angelia Mould, MD;  Location: [place];  Service: Vascular;  Laterality: Left;  Thrombectomy of left thigh arteriovenous gortex graft  . Prostectomy  2011  . Renal grafts    . Pelvic abcess drainage Right 6/14    removal drain s/p bowl resection 13  . Transanal excision of rectal mass N/A 03/30/2013    Procedure: EXCISION OF anal MASS;  Surgeon: Joyice Faster. Cornett, MD;  Location: [place];  Service: General;  Laterality: N/A;  Exam under anesthesia with excision anal verge mass  . Aortogram  08/15/2014    abd aortogram  . Femoral-popliteal bypass graft Right 08/18/2014    Procedure: BYPASS GRAFT FEMORAL-POPLITEAL ARTERY with Gortex Graft;  Surgeon: Serafina Mitchell, MD;  Location: [place];  Service: Vascular;  Laterality: Right;  .  Shuntogram Left 09/29/2012    Procedure: SHUNTOGRAM;  Surgeon: Serafina Mitchell, MD;  Location: [facility] CATH LAB;  Service: Cardiovascular;  Laterality: Left;  . Abdominal aortagram N/A 01/03/2014    Procedure: ABDOMINAL Maxcine Ham;  Surgeon: Serafina Mitchell, MD;  Location: [facility] CATH LAB;  Service: Cardiovascular;  Laterality: N/A;  . Abdominal aortagram Right 01/11/2014    Procedure: ABDOMINAL AORTAGRAM;  Surgeon: Serafina Mitchell, MD;  Location: [facility] CATH LAB;  Service: Cardiovascular;  Laterality: Right;  . Left heart catheterization with coronary angiogram N/A 01/12/2014    Procedure: LEFT HEART CATHETERIZATION WITH CORONARY ANGIOGRAM;  Surgeon: Leonie Man, MD;  Location: [facility] CATH LAB;  Service: Cardiovascular;  Laterality: N/A;  . Percutaneous coronary rotoblator intervention (pci-r) N/A  01/13/2014    Procedure: PERCUTANEOUS CORONARY ROTOBLATOR INTERVENTION (PCI-R);  Surgeon: Burnell Blanks, MD;  Location: [facility] CATH LAB;  Service: Cardiovascular;  Laterality: N/A;  . Abdominal aortagram N/A 07/19/2014    Procedure: ABDOMINAL AORTAGRAM;  Surgeon: Serafina Mitchell, MD;  Location: [facility] CATH LAB;  Service: Cardiovascular;  Laterality: N/A;  . Shuntogram N/A 08/24/2014    Procedure: Earney Mallet;  Surgeon: Conrad Capitan, MD;  Location: [facility] CATH LAB;  Service: Cardiovascular;  Laterality: N/A;  . Colonoscopy N/A 09/11/2014    Procedure: COLONOSCOPY;  Surgeon: Inda Castle, MD;  Location: [place];  Service: Endoscopy;  Laterality: N/A;  . Amputation Left 09/12/2014    Procedure: AMPUTATION DIGIT-LEFT GREAT TOE;  Surgeon: Serafina Mitchell, MD;  Location: [place];  Service: Vascular;  Laterality: Left;  . Flexible sigmoidoscopy N/A 09/21/2014    Procedure: FLEXIBLE SIGMOIDOSCOPY;  Surgeon: Gatha Mayer, MD;  Location: [place];  Service: Endoscopy;  Laterality: N/A;  . Colonoscopy    . Eye surgery Bilateral     cataracts removed with lens implant    Family History  Problem Relation Age of Onset  . Hypertension Father   . Pneumonia Brother   . Lung disease Brother    Social History:  reports that he quit smoking about 41 years ago. His smoking use included Cigarettes. He has a 10 pack-year smoking history. He has never used smokeless tobacco. He reports that he does not drink alcohol or use illicit drugs.  Allergies:  Allergies  Allergen Reactions  . Allopurinol Other (See Comments)    REACTION: decreased platelets    No prescriptions prior to admission    No results found for this or any previous visit (from the past 48 hour(s)). No results found.  Review of Systems  All other systems reviewed and are negative.   There were no vitals taken for this visit. Physical Exam   On examination patient has gangrenous changes of the right forefoot with osteomyelitis  and ulceration. Assessment/Plan Assessment: Gangrene right forefoot with osteomyelitis and ulceration with peripheral vascular disease.  Plan: We'll plan for a right transmetatarsal amputation. Risks and benefits were discussed including the risk of the wound not healing. Patient states he understands and wishes to proceed at this time.  Milania Haubner V 10/13/2014, 6:33 AM

## 2014-10-13 NOTE — Anesthesia Preprocedure Evaluation (Addendum)
Anesthesia Evaluation  Patient identified by MRN, date of birth, ID band Patient awake    Reviewed: Allergy & Precautions, NPO status , Patient's Chart, lab work & pertinent test results, reviewed documented beta blocker date and time   Airway Mallampati: II  TM Distance: >3 FB Neck ROM: Full    Dental no notable dental hx.    Pulmonary former smoker,  breath sounds clear to auscultation  Pulmonary exam normal       Cardiovascular hypertension, Pt. on medications + angina + CAD, + Peripheral Vascular Disease and +CHF Rhythm:Regular Rate:Normal  STENT 12/2013, ECHO 12/2013 EF 55% no wall motion abnormalities   Neuro/Psych    GI/Hepatic hiatal hernia, GERD-  Medicated,(+) Hepatitis -, C  Endo/Other    Renal/GU DialysisRenal disease     Musculoskeletal   Abdominal   Peds negative pediatric ROS (+)  Hematology  (+) anemia , 8/26 H/H   Anesthesia Other Findings   Reproductive/Obstetrics                          Anesthesia Physical Anesthesia Plan  ASA: IV  Anesthesia Plan: General   Post-op Pain Management: MAC Combined w/ Regional for Post-op pain   Induction: Intravenous  Airway Management Planned: LMA  Additional Equipment:   Intra-op Plan:   Post-operative Plan: Extubation in OR  Informed Consent: I have reviewed the patients History and Physical, chart, labs and discussed the procedure including the risks, benefits and alternatives for the proposed anesthesia with the patient or authorized representative who has indicated his/her understanding and acceptance.   Dental advisory given  Plan Discussed with: CRNA  Anesthesia Plan Comments:        Anesthesia Quick Evaluation                                  Anesthesia Evaluation  Patient identified by MRN, date of birth, ID band Patient awake    Reviewed: Allergy & Precautions, H&P , NPO status , Patient's Chart, lab work &  pertinent test results, reviewed documented beta blocker date and time   History of Anesthesia Complications Negative for: history of anesthetic complications  Airway Mallampati: II  TM Distance: >3 FB Neck ROM: Full    Dental   Pulmonary neg shortness of breath, neg COPDneg recent URI, former smoker,  breath sounds clear to auscultation        Cardiovascular hypertension, Pt. on medications and Pt. on home beta blockers + angina + CAD, + Peripheral Vascular Disease and +CHF Rhythm:Regular     Neuro/Psych negative neurological ROS  negative psych ROS   GI/Hepatic hiatal hernia, GERD-  Medicated and Controlled,(+) Hepatitis -, C  Endo/Other  negative endocrine ROS  Renal/GU ESRF and DialysisRenal diseaseTTS     Musculoskeletal   Abdominal   Peds  Hematology  (+) anemia ,   Anesthesia Other Findings   Reproductive/Obstetrics negative OB ROS                            Anesthesia Physical Anesthesia Plan  ASA: III  Anesthesia Plan: General   Post-op Pain Management:    Induction:   Airway Management Planned: LMA  Additional Equipment: None  Intra-op Plan:   Post-operative Plan: Extubation in OR  Informed Consent: I have reviewed the patients History and Physical, chart, labs and discussed the procedure including  the risks, benefits and alternatives for the proposed anesthesia with the patient or authorized representative who has indicated his/her understanding and acceptance.   Dental advisory given  Plan Discussed with: CRNA and Surgeon  Anesthesia Plan Comments:         Anesthesia Quick Evaluation

## 2014-10-13 NOTE — Progress Notes (Signed)
Pt amdmitted to room 6E04 from Pacu. Pt alert and oriented x 4.  Pt states pain is "tolerable".  Pleasant. O2 @ 2 lpm via .

## 2014-10-13 NOTE — Anesthesia Procedure Notes (Signed)
Procedure Name: LMA Insertion Date/Time: 10/13/2014 4:07 PM Performed by: Williemae Area B Pre-anesthesia Checklist: Patient identified, Emergency Drugs available, Suction available and Patient being monitored Patient Re-evaluated:Patient Re-evaluated prior to inductionOxygen Delivery Method: Circle system utilized Preoxygenation: Pre-oxygenation with 100% oxygen Intubation Type: IV induction Ventilation: Mask ventilation without difficulty LMA: LMA inserted LMA Size: 4.0 Number of attempts: 1 Placement Confirmation: positive ETCO2 and breath sounds checked- equal and bilateral Tube secured with: Tape (taped across cheeks) Dental Injury: Teeth and Oropharynx as per pre-operative assessment

## 2014-10-13 NOTE — Progress Notes (Addendum)
Requested labs be faxed from hd. DR. Tresa Moore MADE AWARE OF CXR DONE 09/24/14 HAD PLEURAL EFFUSION, EDEMA TO BIL ARMS. PATIENT DENIES SOB.

## 2014-10-14 LAB — RENAL FUNCTION PANEL
Albumin: 2 g/dL — ABNORMAL LOW (ref 3.5–5.2)
Anion gap: 16 — ABNORMAL HIGH (ref 5–15)
BUN: 22 mg/dL (ref 6–23)
CO2: 25 mmol/L (ref 19–32)
Calcium: 8.2 mg/dL — ABNORMAL LOW (ref 8.4–10.5)
Chloride: 98 mmol/L (ref 96–112)
Creatinine, Ser: 5.9 mg/dL — ABNORMAL HIGH (ref 0.50–1.35)
GFR calc Af Amer: 11 mL/min — ABNORMAL LOW (ref >90)
GFR calc non Af Amer: 10 mL/min — ABNORMAL LOW (ref >90)
Glucose, Bld: 162 mg/dL — ABNORMAL HIGH (ref 70–99)
Phosphorus: 6 mg/dL — ABNORMAL HIGH (ref 2.3–4.6)
Potassium: 5 mmol/L (ref 3.5–5.1)
Sodium: 139 mmol/L (ref 135–145)

## 2014-10-14 LAB — CBC
HCT: 36.5 % — ABNORMAL LOW (ref 39.0–52.0)
Hemoglobin: 11 g/dL — ABNORMAL LOW (ref 13.0–17.0)
MCH: 25.9 pg — ABNORMAL LOW (ref 26.0–34.0)
MCHC: 30.1 g/dL (ref 30.0–36.0)
MCV: 85.9 fL (ref 78.0–100.0)
Platelets: 150 10*3/uL (ref 150–400)
RBC: 4.25 MIL/uL (ref 4.22–5.81)
RDW: 16.4 % — ABNORMAL HIGH (ref 11.5–15.5)
WBC: 8.9 10*3/uL (ref 4.0–10.5)

## 2014-10-14 LAB — HEPATITIS B SURFACE ANTIGEN: Hepatitis B Surface Ag: NEGATIVE

## 2014-10-14 MED ORDER — CALCIUM ACETATE 667 MG PO CAPS
2001.0000 mg | ORAL_CAPSULE | Freq: Three times a day (TID) | ORAL | Status: DC
  Administered 2014-10-14 – 2014-10-18 (×9): 2001 mg via ORAL
  Filled 2014-10-14 (×11): qty 3

## 2014-10-14 MED ORDER — BACITRACIN-NEOMYCIN-POLYMYXIN OINTMENT TUBE
TOPICAL_OINTMENT | CUTANEOUS | Status: DC | PRN
  Filled 2014-10-14 (×2): qty 15

## 2014-10-14 MED ORDER — OXYCODONE-ACETAMINOPHEN 5-325 MG PO TABS
ORAL_TABLET | ORAL | Status: AC
  Filled 2014-10-14: qty 2

## 2014-10-14 MED ORDER — CALCITRIOL 0.5 MCG PO CAPS
3.0000 ug | ORAL_CAPSULE | ORAL | Status: DC
  Administered 2014-10-14 – 2014-10-17 (×3): 3 ug via ORAL
  Filled 2014-10-14 (×3): qty 6

## 2014-10-14 NOTE — Procedures (Signed)
I was present at this dialysis session, have reviewed the session itself and made  appropriate changes  [name] MD (pgr) [phone]    ([phone] 10/14/2014, 2:28 PM

## 2014-10-14 NOTE — Progress Notes (Signed)
PT Cancellation Note  Patient Details Name: [name] MRN: [id number] DOB: 12/26/1960   Cancelled Treatment:    Reason Eval/Treat Not Completed: Other (comment) (Pt just received lunch.) Will try again tomorrow.   Devrin Monforte 10/14/2014, 3:32 PM  [facility] PT [phone]

## 2014-10-14 NOTE — Progress Notes (Signed)
PT Cancellation Note  Patient Details Name: [name] MRN: [id number] DOB: 10/19/1960   Cancelled Treatment:    Reason Eval/Treat Not Completed: Patient at procedure or test/unavailable (Pt in HD.) Will follow up later.   Lanna Labella 10/14/2014, 9:39 AM  [place]

## 2014-10-14 NOTE — Progress Notes (Signed)
Patient ID: [name], male   DOB: 12-Apr-1961, 54 y.o.   MRN: [id number] Currently in dialysis.  His right foot dressing is clean and intact.  Doing well overall.  Likely short-term SNF placement Monday.

## 2014-10-14 NOTE — Consult Note (Signed)
[facility] Renal Consultation Note    Indication for Consultation:  Management of ESRD/hemodialysis; anemia, hypertension/volume and secondary hyperparathyroidism PCP:  HPI: [name] is a 54 y.o. male with ESRD on TTS dialysis with complex past medical history including recent GI bleed d/c 12/31 off plavix and ASA wit recommendations to resume 1-2 weeks post d/c, severe PVD with amputation of infected left great toe/osto 12/22 and multiple vascular procedures in 2015 to try to salvage feet.  He saw Dr. Ilsa Iha for follow up 1/18 who recommended a right transmet for progressive gangrenous changes to all 5 right toes. He is admitted following surgery with anticipated discharge to a SNF on Monday. He is receiving dialysis today on schedule.  Since his last hospitalization, his EDW has been gradually lowered 3 kg. His intake has been poor.  He has had severe right LE pain. He has been putting neosporin on his penis, because "someone has to do something".  Denies SOB, CP, diarrhea.  Has had great difficulty managing at home due to pain and mobility issues.  Past Medical History  Diagnosis Date  . ESRD on hemodialysis     a. ESRD 2/2 HTN with renal transplant in 1987 (cadaveric) after short period of dialysis;  b. Transplant failed in 2004 and he went back on HD;  c. As of 10/15 getting HD via L thigh AVG on a TTS schedule at [facility] on [facility].  . Hypertension   . Hx of kidney transplant     a. 1987-> back on HD since 2004  . Gout tophi   . Chronic steroid use     a. Has severe gout. Did not tolerate Allopurinol. Do not taper per PCP   . Systolic CHF, chronic     2 D echo 04/2012 with EF of 45 %   . Malnutrition   . Hx SBO 04/2012    a. 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 F/U with Dr Donne Hazel. Started on TNA  . Anemia associated with chronic renal failure   . Hepatitis C   . GERD  (gastroesophageal reflux disease)   . H/O hiatal hernia   . Arthritis   . History of DVT (deep vein thrombosis)   . Peripheral vascular disease     a. 12/2013 PTA of L Pop;  b. 12/2013 PTA R pop, R DP;  c. 06/2014 L Pop CBA/DCB PTA.  . Prostate cancer   . CAD (coronary artery disease)     a. 12/2013 Cath/PCI: EF 45-50%, LM Ca2+, LAD 30-40p, 48m, D1/2/3 min irregs, LCX 50ost, 30-66m, RCA 70p, 95/14m (Rota->3.0x23 Xience distal, 3.0x23 Xience mid, 3.0x28 Xience prox).  . Chronic diastolic CHF (congestive heart failure)     a. 12/2013 Echo: EF 55-60%, mild LVH, nl wall motion, Gr 2 DD.  . Dry gangrene     a. L great toe   Past Surgical History  Procedure Laterality Date  . Laparotomy  04/14/2012    Procedure: EXPLORATORY LAPAROTOMY;  Surgeon: Joyice Faster. Cornett, MD;  Location: [place];  Service: General;  Laterality: N/A;  . Colon resection  04/14/2012  . Laparotomy  04/22/2012    Procedure: EXPLORATORY LAPAROTOMY;  Surgeon: Adin Hector, MD;  Location: [place];  Service: General;  Laterality: N/A;  lysis of adhesions, omentoplasty, repair small bowel  . Thrombectomy w/ embolectomy  04/27/2012    Procedure: THROMBECTOMY ARTERIOVENOUS GORE-TEX GRAFT;  Surgeon: Harrell Gave  Nicole Cella, MD;  Location: [place];  Service: Vascular;  Laterality: Left;  Thrombectomy of left thigh arteriovenous gortex graft  . Prostectomy  2011  . Renal grafts    . Pelvic abcess drainage Right 6/14    removal drain s/p bowl resection 13  . Transanal excision of rectal mass N/A 03/30/2013    Procedure: EXCISION OF anal MASS;  Surgeon: Joyice Faster. Cornett, MD;  Location: [place];  Service: General;  Laterality: N/A;  Exam under anesthesia with excision anal verge mass  . Aortogram  08/15/2014    abd aortogram  . Femoral-popliteal bypass graft Right 08/18/2014    Procedure: BYPASS GRAFT FEMORAL-POPLITEAL ARTERY with Gortex Graft;  Surgeon: Serafina Mitchell, MD;  Location: [place];  Service: Vascular;  Laterality: Right;  . Shuntogram Left  09/29/2012    Procedure: SHUNTOGRAM;  Surgeon: Serafina Mitchell, MD;  Location: [facility] CATH LAB;  Service: Cardiovascular;  Laterality: Left;  . Abdominal aortagram N/A 01/03/2014    Procedure: ABDOMINAL Maxcine Ham;  Surgeon: Serafina Mitchell, MD;  Location: [facility] CATH LAB;  Service: Cardiovascular;  Laterality: N/A;  . Abdominal aortagram Right 01/11/2014    Procedure: ABDOMINAL AORTAGRAM;  Surgeon: Serafina Mitchell, MD;  Location: [facility] CATH LAB;  Service: Cardiovascular;  Laterality: Right;  . Left heart catheterization with coronary angiogram N/A 01/12/2014    Procedure: LEFT HEART CATHETERIZATION WITH CORONARY ANGIOGRAM;  Surgeon: Leonie Man, MD;  Location: [facility] CATH LAB;  Service: Cardiovascular;  Laterality: N/A;  . Percutaneous coronary rotoblator intervention (pci-r) N/A 01/13/2014    Procedure: PERCUTANEOUS CORONARY ROTOBLATOR INTERVENTION (PCI-R);  Surgeon: Burnell Blanks, MD;  Location: [facility] CATH LAB;  Service: Cardiovascular;  Laterality: N/A;  . Abdominal aortagram N/A 07/19/2014    Procedure: ABDOMINAL AORTAGRAM;  Surgeon: Serafina Mitchell, MD;  Location: [facility] CATH LAB;  Service: Cardiovascular;  Laterality: N/A;  . Shuntogram N/A 08/24/2014    Procedure: Earney Mallet;  Surgeon: Conrad [place], MD;  Location: [facility] CATH LAB;  Service: Cardiovascular;  Laterality: N/A;  . Colonoscopy N/A 09/11/2014    Procedure: COLONOSCOPY;  Surgeon: Inda Castle, MD;  Location: [place];  Service: Endoscopy;  Laterality: N/A;  . Amputation Left 09/12/2014    Procedure: AMPUTATION DIGIT-LEFT GREAT TOE;  Surgeon: Serafina Mitchell, MD;  Location: [place];  Service: Vascular;  Laterality: Left;  . Flexible sigmoidoscopy N/A 09/21/2014    Procedure: FLEXIBLE SIGMOIDOSCOPY;  Surgeon: Gatha Mayer, MD;  Location: [place];  Service: Endoscopy;  Laterality: N/A;  . Colonoscopy    . Eye surgery Bilateral     cataracts removed with lens implant   Family History  Problem Relation Age of Onset  . Hypertension  Father   . Pneumonia Brother   . Lung disease Brother    Social History:  reports that he quit smoking about 41 years ago. His smoking use included Cigarettes. He has a 10 pack-year smoking history. He has never used smokeless tobacco. He reports that he does not drink alcohol or use illicit drugs. Allergies  Allergen Reactions  . Allopurinol Other (See Comments)    REACTION: decreased platelets   Prior to Admission medications   Medication Sig Start Date End Date Taking? Authorizing Provider  amLODipine (NORVASC) 5 MG tablet Take 2 tablets (10 mg total) by mouth daily. Patient taking differently: Take 5 mg by mouth daily.  09/05/14  Yes Shanker Kristeen Mans, MD  atorvastatin (LIPITOR) 40 MG tablet TAKE 1 TABLET BY MOUTH DAILY AT 6  PM 09/05/14  Yes Shanker Kristeen Mans, MD  calcium acetate (PHOSLO) 667 MG tablet Take 3 tablets by mouth 3 (three) times daily. 09/05/14  Yes Shanker Kristeen Mans, MD  carvedilol (COREG) 6.25 MG tablet Take 1 tablet (6.25 mg total) by mouth 2 (two) times daily with a meal. 09/05/14  Yes Shanker Kristeen Mans, MD  cinacalcet (SENSIPAR) 60 MG tablet Take 1 tablet (60 mg total) by mouth daily. 09/05/14  Yes Shanker Kristeen Mans, MD  clopidogrel (PLAVIX) 75 MG tablet Take 75 mg by mouth daily.  09/07/14  Yes Historical Provider, MD  colchicine (COLCRYS) 0.6 MG tablet Take 0.5 tablets (0.3 mg total) by mouth 2 (two) times a week. On Monday and Thursday 09/05/14  Yes Shanker Kristeen Mans, MD  diphenhydrAMINE (BENADRYL) 25 MG tablet Take 1 tablet (25 mg total) by mouth every 6 (six) hours as needed. 09/24/14  Yes Tiffany Marilu Favre, PA-C  multivitamin (RENA-VIT) TABS tablet Take 1 tablet by mouth daily. 09/05/14  Yes Shanker Kristeen Mans, MD  oxyCODONE (ROXICODONE) 5 MG immediate release tablet Take 1-2 tablets (5-10 mg total) by mouth every 6 (six) hours as needed for severe pain. 09/05/14  Yes Shanker Kristeen Mans, MD  pantoprazole (PROTONIX) 40 MG tablet Take 1 tablet (40 mg total) by mouth  daily. 09/05/14  Yes Shanker Kristeen Mans, MD  predniSONE (DELTASONE) 5 MG tablet Take 1 tablet (5 mg total) by mouth daily. 09/05/14  Yes Shanker Kristeen Mans, MD  HYDROmorphone (DILAUDID) 2 MG tablet Take 2 mg by mouth every 6 (six) hours as needed for severe pain.  08/07/14   Historical Provider, MD  metoCLOPramide (REGLAN) 10 MG tablet Take 1 tablet (10 mg total) by mouth every 8 (eight) hours as needed for nausea or vomiting. Patient not taking: Reported on 10/13/2014 09/05/14   Jonetta Osgood, MD  Nutritional Supplements (FEEDING SUPPLEMENT, NEPRO CARB STEADY,) LIQD Take 237 mLs by mouth as needed (missed meal during dialysis.). Patient not taking: Reported on 10/13/2014 09/05/14   Jonetta Osgood, MD  polyethylene glycol [facility] / Floria Raveling) packet Take 17 g by mouth daily. Patient not taking: Reported on 10/13/2014 09/05/14   Jonetta Osgood, MD  predniSONE (DELTASONE) 10 MG tablet Take 2 tablets (20 mg total) by mouth daily. Patient not taking: Reported on 10/13/2014 09/24/14   Linus Mako, PA-C  senna-docusate (SENOKOT-S) 8.6-50 MG per tablet Take 1 tablet by mouth 2 (two) times daily as needed for mild constipation. Patient not taking: Reported on 10/13/2014 09/05/14   Jonetta Osgood, MD  simethicone (MYLICON) 40 PO/2.4MP drops Take 0.6 mLs (40 mg total) by mouth 4 (four) times daily as needed for flatulence. Patient not taking: Reported on 10/13/2014 09/05/14   Jonetta Osgood, MD   Current Facility-Administered Medications  Medication Dose Route Frequency Provider Last Rate Last Dose  . 0.9 %  sodium chloride infusion   Intravenous Continuous Meridee Score V, MD      . amLODipine (NORVASC) tablet 10 mg  10 mg Oral Daily Meridee Score V, MD      . atorvastatin (LIPITOR) tablet 40 mg  40 mg Oral q1800 Newt Minion, MD   40 mg at 10/13/14 2108  . bisacodyl (DULCOLAX) EC tablet 5 mg  5 mg Oral Daily PRN Newt Minion, MD      . calcitRIOL (ROCALTROL) capsule 3 mcg  3 mcg Oral Q  T,Th,Sa-HD [name], PA-C      . calcium acetate (PHOSLO) tablet  3 tablet  2,001 mg Oral TID Newt Minion, MD   3 tablet at 10/13/14 2108  . carvedilol (COREG) tablet 6.25 mg  6.25 mg Oral BID WC Meridee Score V, MD      . ceFAZolin (ANCEF) IVPB 1 g/50 mL premix  1 g Intravenous Q6H Newt Minion, MD   1 g at 10/14/14 0442  . cinacalcet (SENSIPAR) tablet 60 mg  60 mg Oral Q supper Newt Minion, MD   60 mg at 10/13/14 2108  . clopidogrel (PLAVIX) tablet 75 mg  75 mg Oral Daily Newt Minion, MD      . Derrill Memo ON 10/16/2014] colchicine tablet 0.3 mg  0.3 mg Oral Once per day on Mon Thu Marcus Duda V, MD      . docusate sodium (COLACE) capsule 100 mg  100 mg Oral BID Newt Minion, MD   100 mg at 10/13/14 2108  . feeding supplement (NEPRO CARB STEADY) liquid 237 mL  237 mL Oral PRN Meridee Score V, MD      . HYDROmorphone (DILAUDID) injection 0.5-1 mg  0.5-1 mg Intravenous Q2H PRN Newt Minion, MD   1 mg at 10/14/14 0620  . HYDROmorphone (DILAUDID) tablet 2 mg  2 mg Oral Q6H PRN Newt Minion, MD   2 mg at 10/14/14 0104  . methocarbamol (ROBAXIN) tablet 500 mg  500 mg Oral Q6H PRN Newt Minion, MD       Or  . methocarbamol (ROBAXIN) 500 mg in dextrose 5 % 50 mL IVPB  500 mg Intravenous Q6H PRN Newt Minion, MD      . metoCLOPramide (REGLAN) tablet 5-10 mg  5-10 mg Oral Q8H PRN Newt Minion, MD       Or  . metoCLOPramide (REGLAN) injection 5-10 mg  5-10 mg Intravenous Q8H PRN Newt Minion, MD      . multivitamin (RENA-VIT) tablet 1 tablet  1 tablet Oral Daily Meridee Score V, MD      . neomycin-bacitracin-polymyxin (NEOSPORIN) ointment   Topical PRN Mcarthur Rossetti, MD      . ondansetron [facility]) tablet 4 mg  4 mg Oral Q6H PRN Newt Minion, MD       Or  . ondansetron [facility]) injection 4 mg  4 mg Intravenous Q6H PRN Meridee Score V, MD      . oxyCODONE (Oxy IR/ROXICODONE) immediate release tablet 5-10 mg  5-10 mg Oral Q6H PRN Newt Minion, MD      . oxyCODONE-acetaminophen  (PERCOCET/ROXICET) 5-325 MG per tablet 1-2 tablet  1-2 tablet Oral Q4H PRN Newt Minion, MD      . pantoprazole (PROTONIX) EC tablet 40 mg  40 mg Oral Daily Meridee Score V, MD      . predniSONE (DELTASONE) tablet 20 mg  20 mg Oral Daily Newt Minion, MD   20 mg at 10/13/14 2108  . senna-docusate (Senokot-S) tablet 1 tablet  1 tablet Oral QHS PRN Newt Minion, MD       Facility-Administered Medications Ordered in Other Encounters  Medication Dose Route Frequency Provider Last Rate Last Dose  . fentaNYL (SUBLIMAZE) injection   Intravenous PRN Carylon Perches, MD   25 mcg at 05/05/12 1544  . midazolam (VERSED) 5 MG/5ML injection   Intravenous PRN Carylon Perches, MD   2 mg at 05/05/12 1544   Labs: Basic Metabolic Panel:  Recent Labs Lab 10/14/14 1025  NA 139  K 5.0  CL 98  CO2 25  GLUCOSE 162*  BUN 22  CREATININE 5.90*  CALCIUM 8.2*  PHOS 6.0*   Liver Function Tests:  Recent Labs Lab 10/14/14 1025  ALBUMIN 2.0*  CBC:  Recent Labs Lab 10/14/14 1025  WBC 8.9  HGB 11.0*  HCT 36.5*  MCV 85.9  PLT 150   ROS: As per HPI otherwise negative.  Physical Exam: Filed Vitals:   10/14/14 1012 10/14/14 1017 10/14/14 1030 10/14/14 1100  BP: 190/92 183/71 184/81 154/78  Pulse: 79 79 84 95  Temp:      TempSrc:      Resp:      Weight:      SpO2:         General: Emaciated AA M in no acute distress. On HD Head: Normocephalic, atraumatic, sclera non-icteric, mucus membranes are moist, dentition poor Neck: Supple. JVD not elevated. Lungs: Clear bilaterally to auscultation without wheezes, rales, or rhonchi. Breathing is unlabored. Heart: RRR  Abdomen: Soft, non-tender, non-distended with normoactive bowel sounds.  GU - ulcer on penis unchanged no evidence of infection M-S:  Marked muscle wasting Lower extremities: no LE edema; right transmet wrapped; left foot wrapped - pain with light touching of right foot Neuro: Alert and oriented X 3. Moves all extremities  spontaneously. Psych:  Responds to questions appropriately with depressed affect Dialysis Access: left thigh AVGG + bruit  Dialysis Orders: TTS @ [facility]  3.5h 43.5 kg 2K/2Ca 400/800 Profile 2 Heparin none L thigh AVG  Calcitriol 3 mcg Mircera 225 - last given 1/21 - due 2/4 Venofer 50 per week Recent labs from [facility] 370 1/14 corr Ca 9.4 Hgb 9.6 1/21  Assessment/Plan: 1. s/p right transmet - for gangrenous toes -1/22 Dr. Sharol Given per request of Dr. Ilsa Iha who saw him last week;  2.  ESRD -  TTS - on HD goal 4 L K 5.0  3. Hypertension/volume  - BP up some goal 4 L; titrate EDW norvasc 10 and coreg 5.25 bid 4. Anemia  - Hgb 11 - no ESA; on Mircera - last given 1/21; next dose due  2/4; on no heparin HD due to GIB hx  5. Metabolic bone disease -  Continue calcitriol, sensipar and calcium acetate 6. Protein calorie malnutrition - EDW lowered 3 kg last admission- liberalized diet to regular due to poor intake; ask        nutrition to see for snacks - not fond of supplements 7. S/p left great toe amputation 08/2014 -seen by Dr. Trula Slade last week - stable 8.         Penis ulcer -stable - needs urology f/u 9.        Chronic steroid use -gout 10.      Hx Hep C + stable 11.      CAD - back on plavix  BB 12.       Disp - SNF short term - he is agreeable.  [name], PA-C [place] [phone] 10/14/2014, 11:40 AM   Pt seen, examined and agree w A/P as above.  [name] MD pager [phone]    cell [phone] 10/14/2014, 2:29 PM

## 2014-10-15 NOTE — Progress Notes (Signed)
[facility] Progress Note  Assessment/Plan: 1. S/P right transmet amp for gangrenous toes -1/22 Dr. Sharol Given per request of Dr. Ilsa Iha who saw him last week;  2. ESRD - TTS - next HD Tuesday 3. Hypertension/volume - net UF 1.9 norvasc 10 and coreg 5.25 bid - no post weight done - pre weight was 46.7 which would = a post wieght of 44.8 - EDW at outpt HD recently lowred to 43.5 4. Anemia - Hgb 11 - no ESA; on Mircera - last given 1/21; next dose due 2/4; on no heparin HD due to GIB hx  5. Metabolic bone disease - Continue calcitriol, sensipar and calcium acetate 6. Protein calorie malnutrition - EDW lowered 3 kg last admission- liberalized diet to regular due to poor intake; ask nutrition to see for snacks - not fond of supplements; Alb 2 7. S/p left great toe amputation 08/2014 -seen by Dr. Trula Slade last week - stable   8.Penis ulcer -stable - needs urology f/u   9.Chronic steroid use -gout  10.Hx Hep C + stable  11.CAD - back on plavix BB  12.Disp - SNF short term - he is agreeable - possible D/C Monday  [name], [place] [phone] 10/15/2014,8:31 AM  LOS: 2 days   Pt seen, examined and agree w A/P as above.  [name] MD pager [phone]    cell [phone] 10/15/2014, 11:36 PM    Subjective:   Reading newspaper; poor appetite; minimal intake; pain in right leg yesterday on HD  Objective Filed Vitals:   10/14/14 1339 10/14/14 1725 10/14/14 2014 10/15/14 0554  BP: 164/72 139/49 157/84 139/37  Pulse: 85 82 81 87  Temp: 97.6 F (36.4 C) 97.3 F (36.3 C) 97.8 F (36.6 C) 98.3 F (36.8 C)  TempSrc: Oral Oral Oral Oral  Resp: 18 18 16 16   Weight:      SpO2: 99% 99% 100% 100%   Physical Exam General: emaciated; NAD Heart: RRR Lungs: no rales Abdomen: soft nt Extremities: extreme wasting, no edema; feet wrapped Dialysis Access: left thigh AVGG +bruit Dialysis Orders: TTS @ [facility]  3.5h 43.5  kg 2K/2Ca 400/800 Profile 2 Heparin none L thigh AVG  Calcitriol 3 mcg Mircera 225 - last given 1/21 - due 2/4 Venofer 50 per week Recent labs from [facility] 1/14 corr Ca 9.4 Hgb 9.6 1/21  Additional Objective Labs: Basic Metabolic Panel:  Recent Labs Lab 10/14/14 1025  NA 139  K 5.0  CL 98  CO2 25  GLUCOSE 162*  BUN 22  CREATININE 5.90*  CALCIUM 8.2*  PHOS 6.0*   Liver Function Tests:  Recent Labs Lab 10/14/14 1025  ALBUMIN 2.0*   CBC:  Recent Labs Lab 10/14/14 1025  WBC 8.9  HGB 11.0*  HCT 36.5*  MCV 85.9  PLT 150   BMedications: . sodium chloride     . amLODipine  10 mg Oral Daily  . atorvastatin  40 mg Oral q1800  . calcitRIOL  3 mcg Oral Q T,Th,Sa-HD  . calcium acetate  2,001 mg Oral TID WC  . carvedilol  6.25 mg Oral BID WC  . cinacalcet  60 mg Oral Q supper  . clopidogrel  75 mg Oral Daily  . [START ON 10/16/2014] colchicine  0.3 mg Oral Once per day on Mon Thu  . docusate sodium  100 mg Oral BID  . multivitamin  1 tablet Oral Daily  . pantoprazole  40 mg Oral Daily  . predniSONE  20 mg Oral Daily

## 2014-10-15 NOTE — Progress Notes (Signed)
Utilization review completed.  

## 2014-10-15 NOTE — Evaluation (Signed)
Physical Therapy Evaluation Patient Details Name: [name] MRN: [id number] DOB: 10/29/1960 Today's Date: 10/15/2014   History of Present Illness  Pt s/p rt transmet amp on 10/13/13. PMH - PVD, ESRD on HD, HTN, CHF, hep C, CAD, Lt great toe amp 09/12/14.  Clinical Impression  Pt admitted with above diagnosis. Pt currently with functional limitations due to the deficits listed below (see PT Problem List).  Pt will benefit from skilled PT to increase their independence and safety with mobility to allow discharge to the venue listed below. Unable to don post op shoe on rt foot due to not enough foot left to secure the straps of the shoe.      Follow Up Recommendations SNF    Equipment Recommendations  Wheelchair (measurements PT)    Recommendations for Other Services       Precautions / Restrictions Precautions Precautions: Fall Restrictions RLE Weight Bearing: Touchdown weight bearing      Mobility  Bed Mobility Overal bed mobility: Needs Assistance Bed Mobility: Supine to Sit     Supine to sit: Min guard        Transfers Overall transfer level: Needs assistance   Transfers: Lateral/Scoot Transfers          Lateral/Scoot Transfers: Min assist General transfer comment: Pt performed bed to drop arm recliner transfer with min A and pt leaving legs propped on bed until his hips were in chair and then bringing them over to the legrest.  Ambulation/Gait                Stairs            Wheelchair Mobility    Modified Rankin (Stroke Patients Only)       Balance Overall balance assessment: Needs assistance Sitting-balance support: No upper extremity supported;Feet unsupported Sitting balance-Leahy Scale: Good                                       Pertinent Vitals/Pain Pain Assessment: Faces Faces Pain Scale: Hurts little more Pain Location: rt foot Pain Intervention(s): Limited activity within patient's tolerance;Monitored  during session;Repositioned    Home Living Family/patient expects to be discharged to:: Skilled nursing facility Living Arrangements: Other relatives (sister and nephew) Available Help at Discharge: Family;Available PRN/intermittently (alone when family at work) Type of Home: House Home Access: Stairs to enter Entrance Stairs-Rails: Can reach both Entrance Stairs-Number of Steps: 2 Home Layout: One level Home Equipment: [place] - 2 wheels;Cane - single point;Bedside commode;Shower seat      Prior Function Level of Independence: Needs assistance   Gait / Transfers Assistance Needed: amb with rolling walker, Difficulty up/down stairs to get in house.           Hand Dominance   Dominant Hand: Right    Extremity/Trunk Assessment   Upper Extremity Assessment: Generalized weakness           Lower Extremity Assessment: Generalized weakness         Communication   Communication: No difficulties  Cognition Arousal/Alertness: Awake/alert Behavior During Therapy: WFL for tasks assessed/performed Overall Cognitive Status: Within Functional Limits for tasks assessed                      General Comments      Exercises        Assessment/Plan    PT Assessment Patient needs continued PT services  PT Diagnosis Difficulty walking;Generalized weakness   PT Problem List Decreased strength;Decreased balance;Decreased mobility;Decreased knowledge of use of DME  PT Treatment Interventions DME instruction;Patient/family education;Gait training;Functional mobility training;Therapeutic activities;Therapeutic exercise;Balance training   PT Goals (Current goals can be found in the Care Plan section) Acute Rehab PT Goals Patient Stated Goal: eventually return home PT Goal Formulation: With patient Time For Goal Achievement: 10/29/14 Potential to Achieve Goals: Good    Frequency Min 3X/week   Barriers to discharge Decreased caregiver support      Co-evaluation                End of Session   Activity Tolerance: Patient tolerated treatment well Patient left: in chair;with call bell/phone within reach Nurse Communication: Mobility status         Time: 7096-2836 PT Time Calculation (min) (ACUTE ONLY): 21 min   Charges:   PT Evaluation $Initial PT Evaluation Tier I: 1 Procedure     PT G Codes:        [name] November 05, 2014, 12:44 PM  [facility] PT [phone]

## 2014-10-15 NOTE — Progress Notes (Signed)
Patient ID: [name], male   DOB: Mar 12, 1961, 54 y.o.   MRN: [id number] Tolerated dialysis well yesterday.  Reports minimal pain right foot.  Dressing is clean, dry, and intact.  Therapy to work with him today.

## 2014-10-16 ENCOUNTER — Encounter (HOSPITAL_COMMUNITY): Payer: Self-pay | Admitting: Orthopedic Surgery

## 2014-10-16 MED ORDER — BOOST / RESOURCE BREEZE PO LIQD
1.0000 | Freq: Three times a day (TID) | ORAL | Status: DC
  Administered 2014-10-16 – 2014-10-17 (×3): 1 via ORAL

## 2014-10-16 NOTE — Progress Notes (Signed)
Patient ID: [name], male   DOB: [birth year], 54 y.o.   MRN: [id number] Patient's dressing was removed. Patient is having ischemic pain. The wound edges are well approximated there are no gangrenous changes the skin is viable. Plan for discharge to skilled nursing.

## 2014-10-16 NOTE — Progress Notes (Signed)
INITIAL NUTRITION ASSESSMENT  DOCUMENTATION CODES Per approved criteria  -Severe malnutrition in the context of chronic illness -Underweight   Pt meets the criteria for severe MALNUTRITION in the context of chronic illness as evidenced by severe depletion of fat and muscle mass.   INTERVENTION: -Resource Breeze TID providing 250 kcal and 9 grams of protein -Provide nourishment snacks (egg salad, chicken salad) ordered -RD continue to monitor  NUTRITION DIAGNOSIS: Inadequate oral intake related to poor appetite as evidenced by pt report and severe depletion of fat and muscle mass.   Goal: Pt to meet >/=90% of needs through meals, snacks and supplements  Monitor:  Pt PO intake, weight, labs  Reason for Assessment: Consult per MD  54 y.o. 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  Hx of ESRD on HD, HTN, CHF, GERD, Hepatitis C, kidney transplant in 1987 (back on HD in 2004).    Pt reports little appetite is usual for him and reports weight loss of a few pounds.  Usual body weight is reported at 108 lbs (5% weight loss in past 3 months).  Pt does not like the supplements he has tried.  RD recommended mixing Resource Breeze with diet sprite and it won't taste as sweet.  Pt agreed to receiving snacks, he likes egg salad and chicken salad sandwiches. Current PO intake 25-75%.  Phosphorus elevated at 6.0.  Nutrition Focused Physical Exam:  Subcutaneous Fat:  Orbital Region: severe depeletion Upper Arm Region: severe depletion Thoracic and Lumbar Region: severe depletion  Muscle:  Temple Region: severe depletion Clavicle Bone Region: severe depletion Clavicle and Acromion Bone Region: severe depeltion Scapular Bone Region: severe depletion Dorsal Hand: severe depletion Patellar Region: severe depletion Anterior Thigh Region: severe depletion Posterior  Calf Region: severe depletion  Edema: none present    Height: Ht Readings from Last 1 Encounters:  10/09/14 6' (1.829 m)    Weight: Wt Readings from Last 1 Encounters:  10/13/14 103 lb 9.6 oz (46.993 kg)    Ideal Body Weight: 178  % Ideal Body Weight: 57%  Wt Readings from Last 10 Encounters:  10/13/14 103 lb 9.6 oz (46.993 kg)  10/09/14 103 lb (46.72 kg)  09/21/14 98 lb (44.453 kg)  09/05/14 101 lb 13.6 oz (46.2 kg)  08/15/14 110 lb (49.896 kg)  08/07/14 108 lb (48.988 kg)  08/02/14 110 lb (49.896 kg)  07/20/14 103 lb 9.9 oz (47 kg)  07/10/14 108 lb 8 oz (49.215 kg)  06/07/14 106 lb 1.6 oz (48.127 kg)    Usual Body Weight: 108  % Usual Body Weight: 95%  BMI:  Body mass index is 14.05 kg/(m^2).  Adjusted Body Weight: 55.4 kg  Estimated Nutritional Needs: Kcal: 1900-2200 kcal Protein: 85-100 g protein Fluid: 1.2L/day  Skin: amputation of right foot  Diet Order: Diet regular    Intake/Output Summary (Last 24 hours) at 10/16/14 1046 Last data filed at 10/16/14 0929  Gross per 24 hour  Intake    360 ml  Output      1 ml  Net    359 ml    Last BM: 1/25   Labs:   Recent Labs Lab 10/14/14 1025  NA 139  K 5.0  CL 98  CO2 25  BUN 22  CREATININE 5.90*  CALCIUM 8.2*  PHOS 6.0*  GLUCOSE 162*    CBG (last 3)  No results for input(s): GLUCAP in  the last 72 hours.  Scheduled Meds: . amLODipine  10 mg Oral Daily  . atorvastatin  40 mg Oral q1800  . calcitRIOL  3 mcg Oral Q T,Th,Sa-HD  . calcium acetate  2,001 mg Oral TID WC  . carvedilol  6.25 mg Oral BID WC  . cinacalcet  60 mg Oral Q supper  . clopidogrel  75 mg Oral Daily  . colchicine  0.3 mg Oral Once per day on Mon Thu  . docusate sodium  100 mg Oral BID  . multivitamin  1 tablet Oral Daily  . pantoprazole  40 mg Oral Daily  . predniSONE  20 mg Oral Daily    Continuous Infusions: . sodium chloride      Past Medical History  Diagnosis Date  . ESRD on hemodialysis     a. ESRD  2/2 HTN with renal transplant in 1987 (cadaveric) after short period of dialysis;  b. Transplant failed in 2004 and he went back on HD;  c. As of 10/15 getting HD via L thigh AVG on a TTS schedule at [facility]  . Hypertension   . Hx of kidney transplant     a. 1987-> back on HD since 2004  . Gout tophi   . Chronic steroid use     a. Has severe gout. Did not tolerate Allopurinol. Do not taper per PCP   . Systolic CHF, chronic     2 D echo 04/2012 with EF of 45 %   . Malnutrition   . Hx SBO 04/2012    a. 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 F/U with Dr Donne Hazel. Started on TNA  . Anemia associated with chronic renal failure   . Hepatitis C   . GERD (gastroesophageal reflux disease)   . H/O hiatal hernia   . Arthritis   . History of DVT (deep vein thrombosis)   . Peripheral vascular disease     a. 12/2013 PTA of L Pop;  b. 12/2013 PTA R pop, R DP;  c. 06/2014 L Pop CBA/DCB PTA.  . Prostate cancer   . CAD (coronary artery disease)     a. 12/2013 Cath/PCI: EF 45-50%, LM Ca2+, LAD 30-40p, 11m, D1/2/3 min irregs, LCX 50ost, 30-5m, RCA 70p, 95/59m (Rota->3.0x23 Xience distal, 3.0x23 Xience mid, 3.0x28 Xience prox).  . Chronic diastolic CHF (congestive heart failure)     a. 12/2013 Echo: EF 55-60%, mild LVH, nl wall motion, Gr 2 DD.  . Dry gangrene     a. L great toe    Past Surgical History  Procedure Laterality Date  . Laparotomy  04/14/2012    Procedure: EXPLORATORY LAPAROTOMY;  Surgeon: Joyice Faster. Cornett, MD;  Location: [place];  Service: General;  Laterality: N/A;  . Colon resection  04/14/2012  . Laparotomy  04/22/2012    Procedure: EXPLORATORY LAPAROTOMY;  Surgeon: Adin Hector, MD;  Location: [place];  Service: General;  Laterality: N/A;  lysis of adhesions, omentoplasty, repair small bowel  . Thrombectomy w/ embolectomy  04/27/2012    Procedure: THROMBECTOMY ARTERIOVENOUS GORE-TEX GRAFT;  Surgeon:  Angelia Mould, MD;  Location: [place];  Service: Vascular;  Laterality: Left;  Thrombectomy of left thigh arteriovenous gortex graft  . Prostectomy  2011  . Renal grafts    . Pelvic abcess drainage Right 6/14    removal drain s/p bowl resection 13  . Transanal excision of rectal mass N/A  03/30/2013    Procedure: EXCISION OF anal MASS;  Surgeon: Joyice Faster. Cornett, MD;  Location: [place];  Service: General;  Laterality: N/A;  Exam under anesthesia with excision anal verge mass  . Aortogram  08/15/2014    abd aortogram  . Femoral-popliteal bypass graft Right 08/18/2014    Procedure: BYPASS GRAFT FEMORAL-POPLITEAL ARTERY with Gortex Graft;  Surgeon: Serafina Mitchell, MD;  Location: [place];  Service: Vascular;  Laterality: Right;  . Shuntogram Left 09/29/2012    Procedure: SHUNTOGRAM;  Surgeon: Serafina Mitchell, MD;  Location: [facility] CATH LAB;  Service: Cardiovascular;  Laterality: Left;  . Abdominal aortagram N/A 01/03/2014    Procedure: ABDOMINAL Maxcine Ham;  Surgeon: Serafina Mitchell, MD;  Location: [facility] CATH LAB;  Service: Cardiovascular;  Laterality: N/A;  . Abdominal aortagram Right 01/11/2014    Procedure: ABDOMINAL AORTAGRAM;  Surgeon: Serafina Mitchell, MD;  Location: [facility] CATH LAB;  Service: Cardiovascular;  Laterality: Right;  . Left heart catheterization with coronary angiogram N/A 01/12/2014    Procedure: LEFT HEART CATHETERIZATION WITH CORONARY ANGIOGRAM;  Surgeon: Leonie Man, MD;  Location: [facility] CATH LAB;  Service: Cardiovascular;  Laterality: N/A;  . Percutaneous coronary rotoblator intervention (pci-r) N/A 01/13/2014    Procedure: PERCUTANEOUS CORONARY ROTOBLATOR INTERVENTION (PCI-R);  Surgeon: Burnell Blanks, MD;  Location: [facility] CATH LAB;  Service: Cardiovascular;  Laterality: N/A;  . Abdominal aortagram N/A 07/19/2014    Procedure: ABDOMINAL AORTAGRAM;  Surgeon: Serafina Mitchell, MD;  Location: [facility] CATH LAB;  Service: Cardiovascular;  Laterality: N/A;  . Shuntogram N/A 08/24/2014     Procedure: Earney Mallet;  Surgeon: Conrad El Paso, MD;  Location: [facility] CATH LAB;  Service: Cardiovascular;  Laterality: N/A;  . Colonoscopy N/A 09/11/2014    Procedure: COLONOSCOPY;  Surgeon: Inda Castle, MD;  Location: [place];  Service: Endoscopy;  Laterality: N/A;  . Amputation Left 09/12/2014    Procedure: AMPUTATION DIGIT-LEFT GREAT TOE;  Surgeon: Serafina Mitchell, MD;  Location: [place];  Service: Vascular;  Laterality: Left;  . Flexible sigmoidoscopy N/A 09/21/2014    Procedure: FLEXIBLE SIGMOIDOSCOPY;  Surgeon: Gatha Mayer, MD;  Location: [place];  Service: Endoscopy;  Laterality: N/A;  . Colonoscopy    . Eye surgery Bilateral     cataracts removed with lens implant    Elmer Picker MS Dietetic Intern Pager Number [phone]

## 2014-10-16 NOTE — Care Management Note (Signed)
CARE MANAGEMENT NOTE 10/16/2014  Patient:  [name], [name]   Account Number:  [id number]  Date Initiated:  10/16/2014  Documentation initiated by:  Jaquala Fuller  Subjective/Objective Assessment:   CM followiing for progression and d/c planning.     Action/Plan:   10/16/2014 Met with pt, IM given, per PA pt is ready for d//c today to SNF. CSW notified.   Anticipated DC Date:  10/17/2014   Anticipated DC Plan:  SKILLED NURSING FACILITY         Choice offered to / List presented to:             Status of service:  Completed, signed off Medicare Important Message given?  YES (If response is "NO", the following Medicare IM given date fields will be blank) Date Medicare IM given:  10/16/2014 Medicare IM given by:  Deegan Valentino Date Additional Medicare IM given:   Additional Medicare IM given by:    Discharge Disposition:  [place]  Per UR Regulation:    If discussed at Long Length of Stay Meetings, dates discussed:    Comments:

## 2014-10-16 NOTE — Progress Notes (Signed)
[name] Progress Note  Assessment/Plan: 1. S/P right transmet amp for gangrenous toes -1/22 Dr. Sharol Given per request of Dr. Ilsa Iha who saw him last week;  2. ESRD - TTS - next HD Tuesday if still here 3. Hypertension/volume - net UF 1.9 norvasc 10 and coreg 5.25 bid - no post weight done Sat- pre weight was 46.7 which would = a post wieght of 44.8 - EDW at outpt HD recently lowred to 43.5 - tells me BP dropped during HD 4. Anemia - Hgb 11 - no ESA; on Mircera - last given 1/21; next dose due 2/4; on no heparin HD due to GIB hx  5. Metabolic bone disease - Continue calcitriol, sensipar and calcium acetate; last P was 6 6. Protein calorie malnutrition - EDW lowered 3 kg last admission- liberalized diet to regular due to poor intake; ask nutrition to see for snacks - not fond of supplements; Alb 2 7. S/p left great toe amputation 08/2014 -seen by Dr. Trula Slade last week - stable  8.Penis ulcer -stable - needs urology f/u  9.Chronic steroid use -gout 10.Hx Hep C + stable 11.CAD - back on plavix BB 12.Disp - SNF short term - he is agreeable - possible D/C Monday, but if not will dialyze first HD Tuesday  [name], PA-C [facility] Bee[facility] [phone] 10/16/2014,10:03 AM  LOS: 3 days   Pt seen, examined and agree w A/P as above. Stable for dc from renal standpoint.  [name] MD pager [phone]    cell [phone] 10/16/2014, 2:14 PM    Subjective:  No one has talked with him about NH  Objective Filed Vitals:   10/15/14 0941 10/15/14 1730 10/15/14 2029 10/16/14 0459  BP: 160/80 135/47 182/89 147/72  Pulse: 86 84 80 87  Temp: 97.9 F (36.6 C) 98.3 F (36.8 C) 98.1 F (36.7 C) 97.9 F (36.6 C)  TempSrc: Oral Oral Oral Oral  Resp: 18 18 16 16   Weight:      SpO2: 99% 98% 100% 100%   Physical Exam General: emaciated NAD depressed affect Heart: RRR Lungs: no rales Abdomen: soft NT Extremities: extreme muscle  wasting - feet wrapped no sig LE edema; some left upper extremity edema (chronic per pt) Dialysis Access: left thigh AVGG  Dialysis Orders: TTS @ [facility]  3.5h 43.5 kg 2K/2Ca 400/800 Profile 2 Heparin none L thigh AVG  Calcitriol 3 mcg Mircera 225 - last given 1/21 - due 2/4 Venofer 50 per week Recent labs from [facility] 370 1/14 corr Ca 9.4 Hgb 9.6 1/21  Additional Objective Labs: Basic Metabolic Panel:  Recent Labs Lab 10/14/14 1025  NA 139  K 5.0  CL 98  CO2 25  GLUCOSE 162*  BUN 22  CREATININE 5.90*  CALCIUM 8.2*  PHOS 6.0*   Liver Function Tests:  Recent Labs Lab 10/14/14 1025  ALBUMIN 2.0*  CBC:  Recent Labs Lab 10/14/14 1025  WBC 8.9  HGB 11.0*  HCT 36.5*  MCV 85.9  PLT 150  Medications: . sodium chloride     . amLODipine  10 mg Oral Daily  . atorvastatin  40 mg Oral q1800  . calcitRIOL  3 mcg Oral Q T,Th,Sa-HD  . calcium acetate  2,001 mg Oral TID WC  . carvedilol  6.25 mg Oral BID WC  . cinacalcet  60 mg Oral Q supper  . clopidogrel  75 mg Oral Daily  . colchicine  0.3 mg Oral Once per day on Mon Thu  . docusate  sodium  100 mg Oral BID  . multivitamin  1 tablet Oral Daily  . pantoprazole  40 mg Oral Daily  . predniSONE  20 mg Oral Daily

## 2014-10-16 NOTE — Clinical Social Work Note (Signed)
CSW received skilled facility placement consult for patient. Talked with patient and sister, [name] by phone and both are in agreement. Full assessment to follow.  Jerzi Tigert Givens, MSW, LCSW Licensed Clinical Social Worker [place] [phone]

## 2014-10-17 LAB — CBC
HCT: 33.3 % — ABNORMAL LOW (ref 39.0–52.0)
Hemoglobin: 10.3 g/dL — ABNORMAL LOW (ref 13.0–17.0)
MCH: 26.1 pg (ref 26.0–34.0)
MCHC: 30.9 g/dL (ref 30.0–36.0)
MCV: 84.3 fL (ref 78.0–100.0)
Platelets: 109 10*3/uL — ABNORMAL LOW (ref 150–400)
RBC: 3.95 MIL/uL — ABNORMAL LOW (ref 4.22–5.81)
RDW: 16.7 % — ABNORMAL HIGH (ref 11.5–15.5)
WBC: 21.2 10*3/uL — ABNORMAL HIGH (ref 4.0–10.5)

## 2014-10-17 LAB — RENAL FUNCTION PANEL
Albumin: 1.8 g/dL — ABNORMAL LOW (ref 3.5–5.2)
Anion gap: 15 (ref 5–15)
BUN: 39 mg/dL — ABNORMAL HIGH (ref 6–23)
CO2: 24 mmol/L (ref 19–32)
Calcium: 7.9 mg/dL — ABNORMAL LOW (ref 8.4–10.5)
Chloride: 99 mmol/L (ref 96–112)
Creatinine, Ser: 8.41 mg/dL — ABNORMAL HIGH (ref 0.50–1.35)
GFR calc Af Amer: 7 mL/min — ABNORMAL LOW (ref >90)
GFR calc non Af Amer: 6 mL/min — ABNORMAL LOW (ref >90)
Glucose, Bld: 91 mg/dL (ref 70–99)
Phosphorus: 4.9 mg/dL — ABNORMAL HIGH (ref 2.3–4.6)
Potassium: 5 mmol/L (ref 3.5–5.1)
Sodium: 138 mmol/L (ref 135–145)

## 2014-10-17 MED ORDER — LIDOCAINE HCL (PF) 1 % IJ SOLN: 5.0000 mL | INTRAMUSCULAR | Status: DC | PRN

## 2014-10-17 MED ORDER — LIDOCAINE-PRILOCAINE 2.5-2.5 % EX CREA: 1.0000 "application " | TOPICAL_CREAM | CUTANEOUS | Status: DC | PRN

## 2014-10-17 MED ORDER — HYDROMORPHONE HCL 1 MG/ML IJ SOLN
INTRAMUSCULAR | Status: AC
  Filled 2014-10-17: qty 1

## 2014-10-17 MED ORDER — SODIUM CHLORIDE 0.9 % IV SOLN: 100.0000 mL | INTRAVENOUS | Status: DC | PRN

## 2014-10-17 MED ORDER — PENTAFLUOROPROP-TETRAFLUOROETH EX AERO: 1.0000 "application " | INHALATION_SPRAY | CUTANEOUS | Status: DC | PRN

## 2014-10-17 MED ORDER — NEPRO/CARBSTEADY PO LIQD: 237.0000 mL | ORAL | Status: DC | PRN

## 2014-10-17 NOTE — Clinical Social Work Placement (Addendum)
Clinical Social Work Department CLINICAL SOCIAL WORK PLACEMENT NOTE 10/17/2014  Patient:  [name], [name]  Account Number:  [id number] Admit date:  10/13/2014  Clinical Social Worker:  Demeisha Geraghty Givens, LCSW  Date/time:  10/17/2014 02:23 AM  Clinical Social Work is seeking post-discharge placement for this patient at the following level of care:   SKILLED NURSING   (*CSW will update this form in Epic as items are completed)   10/16/2014  Patient/family provided with [place] Department of Clinical Social Work's list of facilities offering this level of care within the geographic area requested by the patient (or if unable, by the patient's family).  10/16/2014  Patient/family informed of their freedom to choose among providers that offer the needed level of care, that participate in Medicare, Medicaid or managed care program needed by the patient, have an available bed and are willing to accept the patient.    Patient/family informed of [facility]' ownership interest in [facility], as well as of the fact that they are under no obligation to receive care at this facility.  PASARR submitted to EDS in 2013  PASARR number received in 2013   FL2 transmitted to all facilities in geographic area requested by pt/family on  10/16/2014 FL2 transmitted to all facilities within larger geographic area on   Patient informed that his/her managed care company has contracts with or will negotiate with  certain facilities, including the following:     Patient/family informed of bed offers received:  10/17/2014 Patient chooses bed at [facility] Physician recommends and patient chooses bed at    Patient to be transferred to [place] on  10/17/2014 Patient to be transferred to facility by  Patient and family notified of transfer on 10/17/2014 Name of family member notified:  Westley Hummer, sister  The following physician request were  entered in Epic:   Additional Comments:   Hildreth Robart Givens, MSW, [place] Licensed Clinical Social Worker Clinical Social Work [place] [phone]

## 2014-10-17 NOTE — Progress Notes (Signed)
PT Cancellation Note  Patient Details Name: [name] MRN: [id number] DOB: 07/18/1961   Cancelled Treatment:    Reason Eval/Treat Not Completed: Fatigue/lethargy limiting ability to participate. Pt in HD this morning and reports being too fatigued to participate. Will continue to follow and check back as schedule allows.   Rolinda Roan 10/17/2014, 2:03 PM   Rolinda Roan, PT, DPT Acute Rehabilitation Services Pager: [phone]

## 2014-10-17 NOTE — Progress Notes (Signed)
Patient ID: [name], male   DOB: [birth year], 54 y.o.   MRN: [id number] Patient continues to remove his dressings. There is small amount of blood on the dressing this morning. Patient's F L2 is signed. Plan for discharge to skilled nursing when bed available.

## 2014-10-17 NOTE — Clinical Social Work Psychosocial (Signed)
Clinical Social Work Department BRIEF PSYCHOSOCIAL ASSESSMENT 10/17/2014  Patient:  [name], [name]     Account Number:  [id number]     Admit date:  10/13/2014  Clinical Social Worker:  Frederico Hamman  Date/Time:  10/17/2014 01:50 AM  Referred by:  Physician  Date Referred:  10/16/2014 Referred for  SNF Placement   Other Referral:   Interview type:  Patient Other interview type:   CSW talked with patient briefly and then with sister Westley Hummer by phone.    PSYCHOSOCIAL DATA Living Status:  [place] Admitted from facility:   Level of care:   Primary support name:  Westley Hummer Primary support relationship to patient:  SIBLING Degree of support available:   Strong support from sister    CURRENT CONCERNS Current Concerns  Post-Acute Placement   Other Concerns:    SOCIAL WORK ASSESSMENT / PLAN On 10/16/14, CSW visited room and talked with patient regarding recommendation of short-term rehab. Patient reported that he has been to [facility] before and that his home is in [place], but he has been living with his sister in [place] for awhile. Patient reported that his sister had waited for CSW and was now gone, however he provided CSW with her phone number.    CSW telephoned Ms. Alroy Dust [phone]) regarding short term rehab. Ms. Alroy Dust explained that she can't take care of patient right now in his current condition and he needs rehab. Sister explained that before Thanksgiving, Mr. Meiklejohn was able to do things for himself. she provided her email address for the SNF list to be sent to her - [email or web address]. Her facility preference is [facility].   Assessment/plan status:  Psychosocial Support/Ongoing Assessment of Needs Other assessment/ plan:   Information/referral to community resources:   Skilled facility list emailed to sister.    PATIENT'S/FAMILY'S RESPONSE TO PLAN OF CARE: Patient and sister open to talking with CSW and  agreeable to SNF for ST rehab.     Daeshaun Specht Givens, MSW, LCSW Licensed Clinical Social Worker [place] [phone]

## 2014-10-17 NOTE — Progress Notes (Signed)
Subjective:  Tolerated  Hd today / for dc to snh  Objective Vital signs in last 24 hours: Filed Vitals:   10/17/14 0930 10/17/14 1000 10/17/14 1030 10/17/14 1058  BP: 124/70 113/68 118/50 109/44  Pulse: 103 105 105 102  Temp:    97.6 F (36.4 C)  TempSrc:    Oral  Resp: 14 16 14 13   Weight:    44.23 kg (97 lb 8.2 oz)  SpO2:    96%   Weight change:  Physical Exam General:  Thin , emaciated , NAD, Ox 3 with  depressed affect Heart: RRR Lungs:  CTA /no rales Abdomen: soft NT Extremities:  Bilat. extreme muscle wasting - feet wrapped no pedal  edema; some left upper extremity edema (chronic per pt) Dialysis Access: left thigh AVGG pos  Bruit   Dialysis Orders: TTS @ [facility]  3.5h 43.5 kg 2K/2Ca 400/800 Profile 2 Heparin none L thigh AVG  Calcitriol 3 mcg Mircera 225 - last given 1/21 - due 2/4 Venofer 50 per week Recent labs from [facility] 370 1/14 corr Ca 9.4 Hgb 9.6 1/21  Problem/Plan: 1. S/P right transmet amp for gangrenous toes -1/22 Dr. Sharol Given per request of Dr. Ilsa Iha who saw him last week;  2. ESRD - TTS - next HD Thurs op unit 3. Hypertension/volume -  109 /44  Norvasc 10mg   Hs and coreg 5.25 bid -  post weight 44.23 with bed wt edw no changes 4. Anemia - Hgb 11.10.3  - no ESA; on Mircera - last given 1/21; next dose due 2/4; on no heparin HD due to GIB hx/ no changes at dc  5. Metabolic bone disease - Continue calcitriol, sensipar and calcium acetate;  Phos 4.9 6. Protein calorie malnutrition - EDW lowered 3 kg last admission- liberalized diet to regular due to poor intake  nutrition  Was to see for snacks - not fond of supplements; Alb1.8 7. S/p left great toe amputation 08/2014 -seen by Dr. Trula Slade last week - stable  8.Penis ulcer -stable - needs urology f/u  9.Chronic steroid use -gout 10.Hx Hep C + stable 11.CAD - back on plavix BB 12.Disp - SNF short term - he is agreeable - possible D/C today  Ernest Haber, PA-C [place] [phone] 10/17/2014,2:27 PM  LOS: 4 days   Pt seen, examined and agree w A/P as above.  [name] MD pager [phone]    cell [phone] 10/17/2014, 7:40 PM      Labs: Basic Metabolic Panel:  Recent Labs Lab 10/14/14 1025 10/17/14 0720  NA 139 138  K 5.0 5.0  CL 98 99  CO2 25 24  GLUCOSE 162* 91  BUN 22 39*  CREATININE 5.90* 8.41*  CALCIUM 8.2* 7.9*  PHOS 6.0* 4.9*   Liver Function Tests:  Recent Labs Lab 10/14/14 1025 10/17/14 0720  ALBUMIN 2.0* 1.8*   No results for input(s): LIPASE, AMYLASE in the last 168 hours. No results for input(s): AMMONIA in the last 168 hours. CBC:  Recent Labs Lab 10/14/14 1025 10/17/14 0720  WBC 8.9 21.2*  HGB 11.0* 10.3*  HCT 36.5* 33.3*  MCV 85.9 84.3  PLT 150 109*   Cardiac Enzymes: No results for input(s): CKTOTAL, CKMB, CKMBINDEX, TROPONINI in the last 168 hours. CBG: No results for input(s): GLUCAP in the last 168 hours.  Studies/Results: No results found. Medications: . sodium chloride     . amLODipine  10 mg Oral Daily  . atorvastatin  40 mg Oral q1800  .  calcitRIOL  3 mcg Oral Q T,Th,Sa-HD  . calcium acetate  2,001 mg Oral TID WC  . carvedilol  6.25 mg Oral BID WC  . cinacalcet  60 mg Oral Q supper  . clopidogrel  75 mg Oral Daily  . colchicine  0.3 mg Oral Once per day on Mon Thu  . docusate sodium  100 mg Oral BID  . feeding supplement (RESOURCE BREEZE)  1 Container Oral TID BM  . multivitamin  1 tablet Oral Daily  . pantoprazole  40 mg Oral Daily  . predniSONE  20 mg Oral Daily

## 2014-10-17 NOTE — Clinical Social Work Note (Signed)
CSW informed during morning progression that patient medically stable for discharge. Laqter this afternoon CSW informed by patient's nurse that per MD patient will not discharge until tomorrow, Wednesday-1/27.  CSW talked with patient and sister, [name] at the bedside and they were aware.  [name], MSW, LCSW Licensed Clinical Social Worker [place] [phone]

## 2014-10-18 MED ORDER — AMLODIPINE BESYLATE 5 MG PO TABS: 5.0000 mg | ORAL_TABLET | Freq: Every day | ORAL | Status: DC

## 2014-10-18 MED ORDER — HYDROMORPHONE HCL 2 MG PO TABS: 2.0000 mg | ORAL_TABLET | Freq: Four times a day (QID) | ORAL | Status: AC | PRN

## 2014-10-18 MED ORDER — OXYCODONE HCL 5 MG PO TABS
5.0000 mg | ORAL_TABLET | Freq: Four times a day (QID) | ORAL | Status: AC | PRN
Start: 2014-10-18 — End: ?

## 2014-10-18 NOTE — Discharge Summary (Signed)
Physician Discharge Summary  Patient ID: EVERETTE MALL MRN: [id number] DOB/AGE: [birth year] 54 y.o.  Admit date: 10/13/2014 Discharge date: 10/18/2014  Admission Diagnoses: Gangrene with osteomyelitis right forefoot  Discharge Diagnoses:  Active Problems:   Gangrene of foot   Discharged Condition: stable  Hospital Course: Patient's hospital course was essentially unremarkable. He was admitted and underwent a transmetatarsal amputation on the right. Postoperatively patient progressed slowly with therapy and was discharged to skilled nursing in stable condition. Patient received dialysis during his hospital stay.  Consults: nephrology  Significant Diagnostic Studies: labs: Routine labs  Treatments: dialysis: Hemodialysis and surgery: See operative note  Discharge Exam: Blood pressure 134/63, pulse 89, temperature 98.1 F (36.7 C), temperature source Oral, resp. rate 18, height 6' (1.829 m), weight 44.23 kg (97 lb 8.2 oz), SpO2 99 %. Incision/Wound: incision clean and dry  Disposition: 01-Home or Self Care     Medication List    ASK your doctor about these medications        amLODipine 5 MG tablet  Commonly known as:  NORVASC  Take 2 tablets (10 mg total) by mouth daily.     atorvastatin 40 MG tablet  Commonly known as:  LIPITOR  TAKE 1 TABLET BY MOUTH DAILY AT 6 PM     calcium acetate 667 MG tablet  Commonly known as:  PHOSLO  Take 3 tablets by mouth 3 (three) times daily.     carvedilol 6.25 MG tablet  Commonly known as:  COREG  Take 1 tablet (6.25 mg total) by mouth 2 (two) times daily with a meal.     cinacalcet 60 MG tablet  Commonly known as:  SENSIPAR  Take 1 tablet (60 mg total) by mouth daily.     clopidogrel 75 MG tablet  Commonly known as:  PLAVIX  Take 75 mg by mouth daily.     colchicine 0.6 MG tablet  Commonly known as:  COLCRYS  Take 0.5 tablets (0.3 mg total) by mouth 2 (two) times a week. On Monday and Thursday     diphenhydrAMINE 25 MG  tablet  Commonly known as:  BENADRYL  Take 1 tablet (25 mg total) by mouth every 6 (six) hours as needed.     feeding supplement (NEPRO CARB STEADY) Liqd  Take 237 mLs by mouth as needed (missed meal during dialysis.).     HYDROmorphone 2 MG tablet  Commonly known as:  DILAUDID  Take 2 mg by mouth every 6 (six) hours as needed for severe pain.     metoCLOPramide 10 MG tablet  Commonly known as:  REGLAN  Take 1 tablet (10 mg total) by mouth every 8 (eight) hours as needed for nausea or vomiting.     multivitamin Tabs tablet  Take 1 tablet by mouth daily.     oxyCODONE 5 MG immediate release tablet  Commonly known as:  ROXICODONE  Take 1-2 tablets (5-10 mg total) by mouth every 6 (six) hours as needed for severe pain.     pantoprazole 40 MG tablet  Commonly known as:  PROTONIX  Take 1 tablet (40 mg total) by mouth daily.     polyethylene glycol packet  Commonly known as:  MIRALAX / GLYCOLAX  Take 17 g by mouth daily.     predniSONE 5 MG tablet  Commonly known as:  DELTASONE  Take 1 tablet (5 mg total) by mouth daily.     predniSONE 10 MG tablet  Commonly known as:  DELTASONE  Take 2 tablets (20  mg total) by mouth daily.     senna-docusate 8.6-50 MG per tablet  Commonly known as:  Senokot-S  Take 1 tablet by mouth 2 (two) times daily as needed for mild constipation.     simethicone 40 MG/0.6ML drops  Commonly known as:  MYLICON  Take 0.6 mLs (40 mg total) by mouth 4 (four) times daily as needed for flatulence.           Follow-up Information    Follow up with DUDA,MARCUS V, MD In 2 weeks.   Specialty:  Orthopedic Surgery   Contact information:   [place] [address] [phone]       Signed: Newt Minion 10/18/2014, 6:34 AM

## 2014-10-18 NOTE — Progress Notes (Signed)
Subjective: Interval History: has no complaint, may get out of here.  weak.  Objective: Vital signs in last 24 hours: Temp:  [97.6 F (36.4 C)-99 F (37.2 C)] 98.1 F (36.7 C) (01/27 0500) Pulse Rate:  [80-102] 89 (01/27 0500) Resp:  [13-18] 18 (01/27 0500) BP: (105-134)/(43-63) 134/63 mmHg (01/27 0500) SpO2:  [96 %-99 %] 99 % (01/27 0500) Weight:  [44.23 kg (97 lb 8.2 oz)] 44.23 kg (97 lb 8.2 oz) (01/26 1058) Weight change: -1.17 kg (-2 lb 9.3 oz)  Intake/Output from previous day: 01/26 0701 - 01/27 0700 In: 360 [P.O.:360] Out: 1430  Intake/Output this shift:    General appearance: alert, cooperative and cachectic Resp: diminished breath sounds LLL Cardio: S1, S2 normal and systolic murmur: holosystolic 2/6, blowing at apex GI: pos bs, many scars, soft, nontender. liver down 4 cm Extremities: AVG L groin B&T, dressings both feet.  Lab Results:  Recent Labs  10/17/14 0720  WBC 21.2*  HGB 10.3*  HCT 33.3*  PLT 109*   BMET:  Recent Labs  10/17/14 0720  NA 138  K 5.0  CL 99  CO2 24  GLUCOSE 91  BUN 39*  CREATININE 8.41*  CALCIUM 7.9*   No results for input(s): PTH in the last 72 hours. Iron Studies: No results for input(s): IRON, TIBC, TRANSFERRIN, FERRITIN in the last 72 hours.  Studies/Results: No results found.  I have reviewed the patient's current medications.  Assessment/Plan: 1 ESRD HD TTS , slowly lower wgts to lower meds. 2 anemia epo, Fe 3 HTN lower Amlod, wgt 4 HPTH sensipar and vit D 5 Gout inactive 6 Malnutrition suppl 7 PVD s/p TMA per Drs. Duda and Brabham 8 Hep C   9 Prostate Ca 10 Short bowel P HD Tues, epo, suppl,  Vit D, sensipar , OT/ PT    LOS: 5 days   [name] L 10/18/2014,10:54 AM

## 2014-10-18 NOTE — Progress Notes (Signed)
Physical Therapy Treatment Patient Details Name: [name] MRN: [id number] DOB: 1961/09/19 Today's Date: 10/18/2014    History of Present Illness Pt s/p rt transmet amp on 10/13/13. PMH - PVD, ESRD on HD, HTN, CHF, hep C, CAD, Lt great toe amp 09/12/14.    PT Comments    Pt progressing towards physical therapy goals. Was able to maintain NWB on the RLE with RW during SPT bed to chair. Pt is anticipating d/c today - continue to feel that pt will require further skilled services to improve functional mobility prior to return home.  Follow Up Recommendations  SNF     Equipment Recommendations  Wheelchair (measurements PT)    Recommendations for Other Services       Precautions / Restrictions Precautions Precautions: Fall Required Braces or Orthoses: Other Brace/Splint Other Brace/Splint: Post op shoe on the LLE Restrictions Weight Bearing Restrictions: Yes RLE Weight Bearing: Non weight bearing    Mobility  Bed Mobility Overal bed mobility: Needs Assistance Bed Mobility: Supine to Sit     Supine to sit: Min guard     General bed mobility comments: Increased time and heavy use of bed rails for transition to sitting EOB. Increased cueing required for pt to scoot far enough out to edge of bed.   Transfers Overall transfer level: Needs assistance Equipment used: Rolling walker (2 wheeled) Transfers: Sit to/from [facility] Sit to Stand: Mod assist Stand pivot transfers: Min guard       General transfer comment: Pt was able to power-up to full standing position with assistance, and maintain NWB status on the RLE. Post-op shoe was donned on LLE, and pt was able to take hopping pivotal steps around to the chair.   Ambulation/Gait             General Gait Details: Deferred due to fatigue   Stairs            Wheelchair Mobility    Modified Rankin (Stroke Patients Only)       Balance Overall balance assessment: Needs  assistance Sitting-balance support: Feet supported;No upper extremity supported Sitting balance-Leahy Scale: Good     Standing balance support: Bilateral upper extremity supported;During functional activity Standing balance-Leahy Scale: Poor Standing balance comment: Pt required UE support to maintain standing balance.                     Cognition Arousal/Alertness: Awake/alert Behavior During Therapy: WFL for tasks assessed/performed Overall Cognitive Status: Within Functional Limits for tasks assessed                      Exercises      General Comments General comments (skin integrity, edema, etc.): Pt had undone ace bandage around the L foot, and therapist re-wrapped to protect foot during mobility.       Pertinent Vitals/Pain Pain Assessment: Faces Faces Pain Scale: Hurts little more Pain Location: Rt foot Pain Intervention(s): Limited activity within patient's tolerance;Monitored during session;Repositioned    Home Living                      Prior Function            PT Goals (current goals can now be found in the care plan section) Acute Rehab PT Goals Patient Stated Goal: eventually return home PT Goal Formulation: With patient Time For Goal Achievement: 10/29/14 Potential to Achieve Goals: Good Progress towards PT goals: Progressing toward goals  Frequency  Min 3X/week    PT Plan Current plan remains appropriate    Co-evaluation             End of Session Equipment Utilized During Treatment: Gait belt Activity Tolerance: Patient limited by fatigue Patient left: in chair;with call bell/phone within reach;with family/visitor present;Other (comment) (MD present in room)     Time: 9024-0973 PT Time Calculation (min) (ACUTE ONLY): 21 min  Charges:  $Therapeutic Activity: 8-22 mins                    G Codes:      Rolinda Roan 11-02-14, 11:27 AM   Rolinda Roan, PT, DPT Acute Rehabilitation Services Pager:  [phone]

## 2014-10-20 ENCOUNTER — Non-Acute Institutional Stay (SKILLED_NURSING_FACILITY): Payer: Medicare Other | Admitting: Internal Medicine

## 2014-10-20 DIAGNOSIS — L98499 Non-pressure chronic ulcer of skin of other sites with unspecified severity: Secondary | ICD-10-CM

## 2014-10-20 DIAGNOSIS — B182 Chronic viral hepatitis C: Secondary | ICD-10-CM

## 2014-10-20 DIAGNOSIS — N186 End stage renal disease: Secondary | ICD-10-CM

## 2014-10-20 DIAGNOSIS — I739 Peripheral vascular disease, unspecified: Secondary | ICD-10-CM

## 2014-10-20 DIAGNOSIS — I7025 Atherosclerosis of native arteries of other extremities with ulceration: Secondary | ICD-10-CM

## 2014-10-20 DIAGNOSIS — M1A30X0 Chronic gout due to renal impairment, unspecified site, without tophus (tophi): Secondary | ICD-10-CM

## 2014-10-20 DIAGNOSIS — Z7952 Long term (current) use of systemic steroids: Secondary | ICD-10-CM

## 2014-10-20 DIAGNOSIS — K56609 Unspecified intestinal obstruction, unspecified as to partial versus complete obstruction: Secondary | ICD-10-CM

## 2014-10-20 DIAGNOSIS — Z992 Dependence on renal dialysis: Secondary | ICD-10-CM

## 2014-10-20 DIAGNOSIS — I10 Essential (primary) hypertension: Secondary | ICD-10-CM

## 2014-10-20 DIAGNOSIS — K5669 Other intestinal obstruction: Secondary | ICD-10-CM

## 2014-10-20 DIAGNOSIS — I96 Gangrene, not elsewhere classified: Secondary | ICD-10-CM

## 2014-10-20 NOTE — Progress Notes (Addendum)
MRN: [id number] Name: [name]  Sex: male Age: 54 y.o. DOB: 1960-12-09  [place] #: Helene Kelp Facility/Room:210 Level Of Care: SNF Provider: Inocencio Homes D Emergency Contacts: Extended Emergency Contact Information Primary Emergency Contact: [place], [place] [address] Johnnette Litter of [place] Phone: [phone] Work Phone: [phone] Relation: Sister  Code Status: FULL  Allergies: Allopurinol  Chief Complaint  Patient presents with  . New Admit To SNF    HPI: Patient is 54 y.o. male who is s/p trans metatarsal amputation 2/2 to PVD who is admitted to SNF for OT/PT.  Past Medical History  Diagnosis Date  . ESRD on hemodialysis     a. ESRD 2/2 HTN with renal transplant in 1987 (cadaveric) after short period of dialysis;  b. Transplant failed in 2004 and he went back on HD;  c. As of 10/15 getting HD via L thigh AVG on a TTS schedule at [facility].  . Hypertension   . Hx of kidney transplant     a. 1987-> back on HD since 2004  . Gout tophi   . Chronic steroid use     a. Has severe gout. Did not tolerate Allopurinol. Do not taper per PCP   . Systolic CHF, chronic     2 D echo 04/2012 with EF of 45 %   . Malnutrition   . Hx SBO 04/2012    a. 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 F/U with Dr Donne Hazel. Started on TNA  . Anemia associated with chronic renal failure   . Hepatitis C   . GERD (gastroesophageal reflux disease)   . H/O hiatal hernia   . Arthritis   . History of DVT (deep vein thrombosis)   . Peripheral vascular disease     a. 12/2013 PTA of L Pop;  b. 12/2013 PTA R pop, R DP;  c. 06/2014 L Pop CBA/DCB PTA.  . Prostate cancer   . CAD (coronary artery disease)     a. 12/2013 Cath/PCI: EF 45-50%, LM Ca2+, LAD 30-40p, 68m, D1/2/3 min irregs, LCX 50ost, 30-30m, RCA 70p, 95/59m (Rota->3.0x23 Xience distal, 3.0x23 Xience mid, 3.0x28 Xience prox).   . Chronic diastolic CHF (congestive heart failure)     a. 12/2013 Echo: EF 55-60%, mild LVH, nl wall motion, Gr 2 DD.  . Dry gangrene     a. L great toe    Past Surgical History  Procedure Laterality Date  . Laparotomy  04/14/2012    Procedure: EXPLORATORY LAPAROTOMY;  Surgeon: Joyice Faster. Cornett, MD;  Location: [place];  Service: General;  Laterality: N/A;  . Colon resection  04/14/2012  . Laparotomy  04/22/2012    Procedure: EXPLORATORY LAPAROTOMY;  Surgeon: Adin Hector, MD;  Location: [place];  Service: General;  Laterality: N/A;  lysis of adhesions, omentoplasty, repair small bowel  . Thrombectomy w/ embolectomy  04/27/2012    Procedure: THROMBECTOMY ARTERIOVENOUS GORE-TEX GRAFT;  Surgeon: Angelia Mould, MD;  Location: [place];  Service: Vascular;  Laterality: Left;  Thrombectomy of left thigh arteriovenous gortex graft  . Prostectomy  2011  . Renal grafts    . Pelvic abcess drainage Right 6/14    removal drain s/p bowl resection 13  . Transanal excision of rectal mass N/A 03/30/2013    Procedure: EXCISION OF anal MASS;  Surgeon: Joyice Faster. Cornett, MD;  Location: [place];  Service: General;  Laterality: N/A;  Exam under anesthesia with excision anal verge mass  . Aortogram  08/15/2014    abd aortogram  . Femoral-popliteal bypass graft Right 08/18/2014    Procedure: BYPASS GRAFT FEMORAL-POPLITEAL ARTERY with Gortex Graft;  Surgeon: Serafina Mitchell, MD;  Location: [place];  Service: Vascular;  Laterality: Right;  . Shuntogram Left 09/29/2012    Procedure: SHUNTOGRAM;  Surgeon: Serafina Mitchell, MD;  Location: [facility] CATH LAB;  Service: Cardiovascular;  Laterality: Left;  . Abdominal aortagram N/A 01/03/2014    Procedure: ABDOMINAL Maxcine Ham;  Surgeon: Serafina Mitchell, MD;  Location: [facility] CATH LAB;  Service: Cardiovascular;  Laterality: N/A;  . Abdominal aortagram Right 01/11/2014    Procedure: ABDOMINAL AORTAGRAM;  Surgeon: Serafina Mitchell, MD;  Location: [facility] CATH LAB;  Service: Cardiovascular;   Laterality: Right;  . Left heart catheterization with coronary angiogram N/A 01/12/2014    Procedure: LEFT HEART CATHETERIZATION WITH CORONARY ANGIOGRAM;  Surgeon: Leonie Man, MD;  Location: [facility] CATH LAB;  Service: Cardiovascular;  Laterality: N/A;  . Percutaneous coronary rotoblator intervention (pci-r) N/A 01/13/2014    Procedure: PERCUTANEOUS CORONARY ROTOBLATOR INTERVENTION (PCI-R);  Surgeon: Burnell Blanks, MD;  Location: [facility] CATH LAB;  Service: Cardiovascular;  Laterality: N/A;  . Abdominal aortagram N/A 07/19/2014    Procedure: ABDOMINAL AORTAGRAM;  Surgeon: Serafina Mitchell, MD;  Location: [facility] CATH LAB;  Service: Cardiovascular;  Laterality: N/A;  . Shuntogram N/A 08/24/2014    Procedure: Earney Mallet;  Surgeon: Conrad Donora, MD;  Location: [facility] CATH LAB;  Service: Cardiovascular;  Laterality: N/A;  . Colonoscopy N/A 09/11/2014    Procedure: COLONOSCOPY;  Surgeon: Inda Castle, MD;  Location: [place];  Service: Endoscopy;  Laterality: N/A;  . Amputation Left 09/12/2014    Procedure: AMPUTATION DIGIT-LEFT GREAT TOE;  Surgeon: Serafina Mitchell, MD;  Location: [place];  Service: Vascular;  Laterality: Left;  . Flexible sigmoidoscopy N/A 09/21/2014    Procedure: FLEXIBLE SIGMOIDOSCOPY;  Surgeon: Gatha Mayer, MD;  Location: [place];  Service: Endoscopy;  Laterality: N/A;  . Colonoscopy    . Eye surgery Bilateral     cataracts removed with lens implant  . Amputation Right 10/13/2014    Procedure: Right Foot Transmetatarsal Amputation;  Surgeon: Newt Minion, MD;  Location: [place];  Service: Orthopedics;  Laterality: Right;      Medication List       This list is accurate as of: 10/20/14 11:59 PM.  Always use your most recent med list.               amLODipine 5 MG tablet  Commonly known as:  NORVASC  Take 2 tablets (10 mg total) by mouth daily.     atorvastatin 40 MG tablet  Commonly known as:  LIPITOR  TAKE 1 TABLET BY MOUTH DAILY AT 6 PM     calcium acetate  667 MG tablet  Commonly known as:  PHOSLO  Take 3 tablets by mouth 3 (three) times daily.     carvedilol 6.25 MG tablet  Commonly known as:  COREG  Take 1 tablet (6.25 mg total) by mouth 2 (two) times daily with a meal.     cinacalcet 60 MG tablet  Commonly known as:  SENSIPAR  Take 1 tablet (60 mg total) by mouth daily.     clopidogrel 75 MG tablet  Commonly known as:  PLAVIX  Take 75 mg by mouth daily.     colchicine 0.6 MG tablet  Commonly known as:  COLCRYS  Take 0.5 tablets (0.3 mg total) by mouth 2 (two) times a week. On Monday and Thursday     diphenhydrAMINE 25 MG tablet  Commonly known as:  BENADRYL  Take 1 tablet (25 mg total) by mouth every 6 (six) hours as needed.     feeding supplement (NEPRO CARB STEADY) Liqd  Take 237 mLs by mouth as needed (missed meal during dialysis.).     HYDROmorphone 2 MG tablet  Commonly known as:  DILAUDID  Take 1 tablet (2 mg total) by mouth every 6 (six) hours as needed for severe pain.     metoCLOPramide 10 MG tablet  Commonly known as:  REGLAN  Take 1 tablet (10 mg total) by mouth every 8 (eight) hours as needed for nausea or vomiting.     multivitamin Tabs tablet  Take 1 tablet by mouth daily.     oxyCODONE 5 MG immediate release tablet  Commonly known as:  ROXICODONE  Take 1-2 tablets (5-10 mg total) by mouth every 6 (six) hours as needed for severe pain.     pantoprazole 40 MG tablet  Commonly known as:  PROTONIX  Take 1 tablet (40 mg total) by mouth daily.     polyethylene glycol packet  Commonly known as:  MIRALAX / GLYCOLAX  Take 17 g by mouth daily.     predniSONE 5 MG tablet  Commonly known as:  DELTASONE  Take 1 tablet (5 mg total) by mouth daily.     predniSONE 10 MG tablet  Commonly known as:  DELTASONE  Take 2 tablets (20 mg total) by mouth daily.     senna-docusate 8.6-50 MG per tablet  Commonly known as:  Senokot-S  Take 1 tablet by mouth 2 (two) times daily as needed for mild constipation.      simethicone 40 MG/0.6ML drops  Commonly known as:  MYLICON  Take 0.6 mLs (40 mg total) by mouth 4 (four) times daily as needed for flatulence.        No orders of the defined types were placed in this encounter.    Immunization History  Administered Date(s) Administered  . Influenza Split 06/05/2012    History  Substance Use Topics  . Smoking status: Former Smoker -- 1.00 packs/day for 10 years    Types: Cigarettes    Quit date: 03/23/1973  . Smokeless tobacco: Never Used     Comment: Quit 30 years ago  . Alcohol Use: No    Family history is noncontributory    Review of Systems  DATA OBTAINED: from patient, wound nurse GENERAL:  no fevers, fatigue, appetite changes SKIN: No itching; R foot looks OK, L foot with some eschars, s/p great toe amputation EYES: No eye pain, redness, discharge EARS: No earache, tinnitus, change in hearing NOSE: No congestion, drainage or bleeding  MOUTH/THROAT: No mouth or tooth pain, No sore throat RESPIRATORY: No cough, wheezing, SOB CARDIAC: No chest pain, palpitations, lower extremity edema  GI: No abdominal pain, No N/V/D or constipation, No heartburn or reflux  GU: No dysuria, frequency or urgency, or incontinence  MUSCULOSKELETAL: No unrelieved bone/joint pain NEUROLOGIC: No headache, dizziness or focal weakness PSYCHIATRIC: No overt anxiety or sadness, No behavior issue.   Filed Vitals:   10/20/14 1920  BP: 111/65  Pulse: 83  Temp: 97.2 F (36.2 C)  Resp: 19    Physical Exam  GENERAL APPEARANCE: Alert, modconversant,  No acute distress.  SKIN: No diaphoresis rash HEAD: Normocephalic, atraumatic  EYES: Conjunctiva/lids clear. Pupils round, reactive. EOMs intact.  EARS: External exam WNL, canals clear. Hearing grossly normal.  NOSE: No deformity or discharge.  MOUTH/THROAT: Lips w/o lesions  RESPIRATORY: Breathing is even, unlabored. Lung sounds are clear   CARDIOVASCULAR: Heart RRR no murmurs, rubs or gallops. No  peripheral edema.   GASTROINTESTINAL: Abdomen is soft, non-tender, not distended w/ normal bowel sounds. GENITOURINARY: Bladder non tender, not distended  MUSCULOSKELETAL: both feet have dressing on them;no heat or swelling to and around area noted NEUROLOGIC:  Cranial nerves 2-12 grossly intact. Moves all extremities  PSYCHIATRIC: pt seems maybe a little depressed, no behavioral issues  Patient Active Problem List   Diagnosis Date Noted  . Hypoglycemic coma 10/02/2014  . Hepatitis C 10/15/2014  . Gangrene of foot 10/13/2014  . Acute post-hemorrhagic anemia 09/21/2014  . Gas gangrene 09/20/2014  . Diverticulosis of colon with hemorrhage 09/11/2014  . Hematochezia   . Acute GI bleeding   . Ischemic colitis   . Acute GI hemorrhage 08/26/2014  . Abdominal pain, left lower quadrant 08/26/2014  . ESRD on dialysis 08/26/2014  . CAD (coronary artery disease), native coronary artery 08/19/2014  . Essential hypertension 08/19/2014  . Peripheral vascular disease 08/16/2014  . Atherosclerosis of native arteries of the extremities with ulceration 07/19/2014  . Gangrene of toe 07/10/2014  . Critical lower limb ischemia 02/22/2014  . CAD- unstable angina after PTA - s/p RCA DES 01/13/14 02/22/2014  . PVD (peripheral vascular disease) 01/30/2014  . Atherosclerosis of native arteries of the extremities with ulceration(440.23) 01/30/2014  . Pain in limb-Right popliteal 01/20/2014  . Atherosclerosis of native arteries of the extremities with intermittent claudication 01/20/2014  . ESRD on hemodialysis 01/12/2014  . PAD (peripheral artery disease) 01/11/2014  . Angina, class III  01/11/2014  . Protein-calorie malnutrition, severe 01/05/2014  . Lower extremity ulceration 01/03/2014  . 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 Anemia in chronic kidney disease 05/13/2012  . HTN (hypertension) 04/15/2012  . Long term current use of systemic steroids 04/15/2012  .  Malnutrition, calorie 04/15/2012  . Leg graft occlusion 04/15/2012  . Gout 04/14/2012  . SBO (small bowel obstruction) s/p EL/LOA and SBR x 2 04/03/2012    CBC    Component Value Date/Time   WBC 11.9* 09/22/2014 0630   RBC 3.56* 10/16/2014 0630   HGB 11.6* 09/25/2014 0648   HCT 34.0* 10/07/2014 0648   PLT 71* 10/17/2014 0630   MCV 82.3 09/27/2014 0630   LYMPHSABS 0.9 10/02/2014 0630   MONOABS 1.7* 10/20/2014 0630   EOSABS 0.0 09/29/2014 0630   BASOSABS 0.0 09/28/2014 0630    CMP     Component Value Date/Time   NA 137 10/14/2014 0648   K 4.6 10/10/2014 0648   CL 95* 10/05/2014 0648   CO2 33* 10/20/2014 0630   GLUCOSE 150* 10/09/2014 0648   BUN 31* 10/08/2014 0648   CREATININE 5.40* 10/04/2014 0648   CREATININE 7.80* 01/16/2014 1437   CALCIUM 7.4* 09/26/2014 0630   PROT 4.4* 10/18/2014 0630   ALBUMIN 1.3* 09/27/2014 0630   AST 31 10/13/2014 0630   ALT <5 09/26/2014 0630   ALKPHOS 96 10/01/2014 0630   BILITOT 1.0 09/28/2014 0630   GFRNONAA 11* 10/07/2014 0630   GFRAA 12* 10/17/2014 0630    Assessment and Plan  Gangrene of foot Transmetatarsal amputation on R; wound care following   Essential hypertension Controlled on low dose norvasc and low dose coreg   ESRD on hemodialysis TTS  dialysis;pt on phoslo and sensipar   Atherosclerosis of native arteries of the extremities with ulceration Wound care nurse reports s/p L great toe amputation and some eschar on L foot; area was re dressed; pt on plavix   Gout Continue colchicine   Long term current use of systemic steroids On phone with DON for orders for pt It was not clear on d/c summary exactly what dose steroids pt was on so pt put on 20 mg daily;now that I see note in EPIC will increase back to 25 mg   Anemia in chronic kidney disease D/c Hb 10.3 which appears around baseline;pt gets epogen at dialysis   SBO (small bowel obstruction) s/p EL/LOA and SBR x 2 Noted   Hepatitis  C noted     Hennie Duos, MD

## 2014-10-21 ENCOUNTER — Inpatient Hospital Stay (HOSPITAL_COMMUNITY)
Admission: EM | Admit: 2014-10-21 | Discharge: 2014-11-21 | DRG: 640 | Disposition: E | Payer: Medicare Other | Attending: Internal Medicine | Admitting: Internal Medicine

## 2014-10-21 ENCOUNTER — Encounter (HOSPITAL_COMMUNITY): Payer: Self-pay | Admitting: *Deleted

## 2014-10-21 ENCOUNTER — Encounter: Payer: Self-pay | Admitting: Internal Medicine

## 2014-10-21 ENCOUNTER — Inpatient Hospital Stay (HOSPITAL_COMMUNITY): Payer: Medicare Other

## 2014-10-21 ENCOUNTER — Emergency Department (HOSPITAL_COMMUNITY): Payer: Medicare Other

## 2014-10-21 DIAGNOSIS — R571 Hypovolemic shock: Secondary | ICD-10-CM | POA: Diagnosis present

## 2014-10-21 DIAGNOSIS — R40243 Glasgow coma scale score 3-8: Secondary | ICD-10-CM | POA: Diagnosis present

## 2014-10-21 DIAGNOSIS — B1920 Unspecified viral hepatitis C without hepatic coma: Secondary | ICD-10-CM | POA: Diagnosis present

## 2014-10-21 DIAGNOSIS — Z89412 Acquired absence of left great toe: Secondary | ICD-10-CM

## 2014-10-21 DIAGNOSIS — I120 Hypertensive chronic kidney disease with stage 5 chronic kidney disease or end stage renal disease: Secondary | ICD-10-CM | POA: Diagnosis present

## 2014-10-21 DIAGNOSIS — Z79899 Other long term (current) drug therapy: Secondary | ICD-10-CM

## 2014-10-21 DIAGNOSIS — D631 Anemia in chronic kidney disease: Secondary | ICD-10-CM | POA: Diagnosis present

## 2014-10-21 DIAGNOSIS — J9600 Acute respiratory failure, unspecified whether with hypoxia or hypercapnia: Secondary | ICD-10-CM | POA: Diagnosis present

## 2014-10-21 DIAGNOSIS — N2581 Secondary hyperparathyroidism of renal origin: Secondary | ICD-10-CM | POA: Diagnosis present

## 2014-10-21 DIAGNOSIS — N186 End stage renal disease: Secondary | ICD-10-CM | POA: Diagnosis present

## 2014-10-21 DIAGNOSIS — E875 Hyperkalemia: Secondary | ICD-10-CM | POA: Diagnosis present

## 2014-10-21 DIAGNOSIS — Z86718 Personal history of other venous thrombosis and embolism: Secondary | ICD-10-CM | POA: Diagnosis not present

## 2014-10-21 DIAGNOSIS — Z8619 Personal history of other infectious and parasitic diseases: Secondary | ICD-10-CM

## 2014-10-21 DIAGNOSIS — R4182 Altered mental status, unspecified: Secondary | ICD-10-CM

## 2014-10-21 DIAGNOSIS — Y830 Surgical operation with transplant of whole organ as the cause of abnormal reaction of the patient, or of later complication, without mention of misadventure at the time of the procedure: Secondary | ICD-10-CM | POA: Diagnosis present

## 2014-10-21 DIAGNOSIS — Z940 Kidney transplant status: Secondary | ICD-10-CM

## 2014-10-21 DIAGNOSIS — D696 Thrombocytopenia, unspecified: Secondary | ICD-10-CM | POA: Diagnosis present

## 2014-10-21 DIAGNOSIS — I739 Peripheral vascular disease, unspecified: Secondary | ICD-10-CM | POA: Diagnosis present

## 2014-10-21 DIAGNOSIS — Z992 Dependence on renal dialysis: Secondary | ICD-10-CM

## 2014-10-21 DIAGNOSIS — Z89421 Acquired absence of other right toe(s): Secondary | ICD-10-CM | POA: Diagnosis not present

## 2014-10-21 DIAGNOSIS — T8612 Kidney transplant failure: Secondary | ICD-10-CM | POA: Diagnosis present

## 2014-10-21 DIAGNOSIS — R4020 Unspecified coma: Secondary | ICD-10-CM | POA: Diagnosis present

## 2014-10-21 DIAGNOSIS — K219 Gastro-esophageal reflux disease without esophagitis: Secondary | ICD-10-CM | POA: Diagnosis present

## 2014-10-21 DIAGNOSIS — I5042 Chronic combined systolic (congestive) and diastolic (congestive) heart failure: Secondary | ICD-10-CM | POA: Diagnosis present

## 2014-10-21 DIAGNOSIS — Z87891 Personal history of nicotine dependence: Secondary | ICD-10-CM | POA: Diagnosis not present

## 2014-10-21 DIAGNOSIS — L03119 Cellulitis of unspecified part of limb: Secondary | ICD-10-CM | POA: Diagnosis present

## 2014-10-21 DIAGNOSIS — R739 Hyperglycemia, unspecified: Secondary | ICD-10-CM | POA: Diagnosis not present

## 2014-10-21 DIAGNOSIS — Z681 Body mass index (BMI) 19 or less, adult: Secondary | ICD-10-CM

## 2014-10-21 DIAGNOSIS — Z66 Do not resuscitate: Secondary | ICD-10-CM | POA: Diagnosis not present

## 2014-10-21 DIAGNOSIS — Z7952 Long term (current) use of systemic steroids: Secondary | ICD-10-CM

## 2014-10-21 DIAGNOSIS — E43 Unspecified severe protein-calorie malnutrition: Secondary | ICD-10-CM | POA: Diagnosis present

## 2014-10-21 DIAGNOSIS — Z515 Encounter for palliative care: Secondary | ICD-10-CM

## 2014-10-21 DIAGNOSIS — Z8546 Personal history of malignant neoplasm of prostate: Secondary | ICD-10-CM

## 2014-10-21 DIAGNOSIS — M109 Gout, unspecified: Secondary | ICD-10-CM | POA: Diagnosis present

## 2014-10-21 DIAGNOSIS — G9340 Encephalopathy, unspecified: Secondary | ICD-10-CM | POA: Diagnosis present

## 2014-10-21 DIAGNOSIS — E871 Hypo-osmolality and hyponatremia: Secondary | ICD-10-CM | POA: Diagnosis present

## 2014-10-21 DIAGNOSIS — R627 Adult failure to thrive: Secondary | ICD-10-CM | POA: Diagnosis present

## 2014-10-21 DIAGNOSIS — Z7902 Long term (current) use of antithrombotics/antiplatelets: Secondary | ICD-10-CM

## 2014-10-21 DIAGNOSIS — E15 Nondiabetic hypoglycemic coma: Secondary | ICD-10-CM | POA: Diagnosis present

## 2014-10-21 DIAGNOSIS — K912 Postsurgical malabsorption, not elsewhere classified: Secondary | ICD-10-CM | POA: Diagnosis present

## 2014-10-21 DIAGNOSIS — R64 Cachexia: Secondary | ICD-10-CM | POA: Diagnosis present

## 2014-10-21 DIAGNOSIS — I2510 Atherosclerotic heart disease of native coronary artery without angina pectoris: Secondary | ICD-10-CM | POA: Diagnosis present

## 2014-10-21 LAB — COMPREHENSIVE METABOLIC PANEL
ALBUMIN: 1.3 g/dL — AB (ref 3.5–5.2)
ALK PHOS: 96 U/L (ref 39–117)
ALT: <5 U/L (ref 0–53)
ANION GAP: 9 (ref 5–15)
AST: 31 U/L (ref 0–37)
BILIRUBIN TOTAL: 1 mg/dL (ref 0.3–1.2)
BUN: 30 mg/dL — AB (ref 6–23)
CALCIUM: 7.4 mg/dL — AB (ref 8.4–10.5)
CO2: 33 mmol/L — ABNORMAL HIGH (ref 19–32)
Chloride: 96 mmol/L (ref 96–112)
Creatinine, Ser: 5.45 mg/dL — ABNORMAL HIGH (ref 0.50–1.35)
GFR, EST AFRICAN AMERICAN: 12 mL/min — AB (ref >90)
GFR, EST NON AFRICAN AMERICAN: 11 mL/min — AB (ref >90)
GLUCOSE: 160 mg/dL — AB (ref 70–99)
POTASSIUM: 4.7 mmol/L (ref 3.5–5.1)
Sodium: 138 mmol/L (ref 135–145)
TOTAL PROTEIN: 4.4 g/dL — AB (ref 6.0–8.3)

## 2014-10-21 LAB — CBC WITH DIFFERENTIAL/PLATELET
Basophils Absolute: 0 10*3/uL (ref 0.0–0.1)
Basophils Relative: 0 % (ref 0–1)
EOS ABS: 0 10*3/uL (ref 0.0–0.7)
EOS PCT: 0 % (ref 0–5)
HEMATOCRIT: 29.3 % — AB (ref 39.0–52.0)
Hemoglobin: 8.8 g/dL — ABNORMAL LOW (ref 13.0–17.0)
LYMPHS ABS: 0.9 10*3/uL (ref 0.7–4.0)
LYMPHS PCT: 7 % — AB (ref 12–46)
MCH: 24.7 pg — ABNORMAL LOW (ref 26.0–34.0)
MCHC: 30 g/dL (ref 30.0–36.0)
MCV: 82.3 fL (ref 78.0–100.0)
MONO ABS: 1.7 10*3/uL — AB (ref 0.1–1.0)
MONOS PCT: 14 % — AB (ref 3–12)
NEUTROS ABS: 9.3 10*3/uL — AB (ref 1.7–7.7)
NEUTROS PCT: 78 % — AB (ref 43–77)
Platelets: 71 10*3/uL — ABNORMAL LOW (ref 150–400)
RBC: 3.56 MIL/uL — ABNORMAL LOW (ref 4.22–5.81)
RDW: 16.2 % — ABNORMAL HIGH (ref 11.5–15.5)
WBC: 11.9 10*3/uL — AB (ref 4.0–10.5)

## 2014-10-21 LAB — CBG MONITORING, ED
GLUCOSE-CAPILLARY: 236 mg/dL — AB (ref 70–99)
Glucose-Capillary: 15 mg/dL — CL (ref 70–99)
Glucose-Capillary: 394 mg/dL — ABNORMAL HIGH (ref 70–99)
Glucose-Capillary: 275 mg/dL — ABNORMAL HIGH (ref 70–99)

## 2014-10-21 LAB — I-STAT ARTERIAL BLOOD GAS, ED
Acid-Base Excess: 10 mmol/L — ABNORMAL HIGH (ref 0.0–2.0)
Bicarbonate: 33.9 mEq/L — ABNORMAL HIGH (ref 20.0–24.0)
O2 SAT: 100 %
PCO2 ART: 43.8 mmHg (ref 35.0–45.0)
PO2 ART: 487 mmHg — AB (ref 80.0–100.0)
TCO2: 35 mmol/L (ref 0–100)
pH, Arterial: 7.497 — ABNORMAL HIGH (ref 7.350–7.450)

## 2014-10-21 LAB — I-STAT TROPONIN, ED: Troponin i, poc: 0.1 ng/mL (ref 0.00–0.08)

## 2014-10-21 LAB — I-STAT CHEM 8, ED
BUN: 31 mg/dL — AB (ref 6–23)
Calcium, Ion: 0.99 mmol/L — ABNORMAL LOW (ref 1.12–1.23)
Chloride: 95 mmol/L — ABNORMAL LOW (ref 96–112)
Creatinine, Ser: 5.4 mg/dL — ABNORMAL HIGH (ref 0.50–1.35)
GLUCOSE: 150 mg/dL — AB (ref 70–99)
HCT: 34 % — ABNORMAL LOW (ref 39.0–52.0)
Hemoglobin: 11.6 g/dL — ABNORMAL LOW (ref 13.0–17.0)
Potassium: 4.6 mmol/L (ref 3.5–5.1)
Sodium: 137 mmol/L (ref 135–145)
TCO2: 29 mmol/L (ref 0–100)

## 2014-10-21 LAB — GLUCOSE, CAPILLARY
Glucose-Capillary: 245 mg/dL — ABNORMAL HIGH (ref 70–99)
Glucose-Capillary: 168 mg/dL — ABNORMAL HIGH (ref 70–99)
Glucose-Capillary: 226 mg/dL — ABNORMAL HIGH (ref 70–99)
Glucose-Capillary: 209 mg/dL — ABNORMAL HIGH (ref 70–99)
Glucose-Capillary: 215 mg/dL — ABNORMAL HIGH (ref 70–99)
Glucose-Capillary: 185 mg/dL — ABNORMAL HIGH (ref 70–99)

## 2014-10-21 LAB — MRSA PCR SCREENING: MRSA by PCR: NEGATIVE

## 2014-10-21 LAB — I-STAT CG4 LACTIC ACID, ED: Lactic Acid, Venous: 1.86 mmol/L (ref 0.5–2.0)

## 2014-10-21 MED ORDER — ETOMIDATE 2 MG/ML IV SOLN
INTRAVENOUS | Status: AC
Start: 2014-10-21 — End: 2014-10-21
  Administered 2014-10-21: 20 mg
  Filled 2014-10-21: qty 20

## 2014-10-21 MED ORDER — ROCURONIUM BROMIDE 50 MG/5ML IV SOLN
INTRAVENOUS | Status: AC
Start: 2014-10-21 — End: 2014-10-21
  Administered 2014-10-21: 100 mg
  Filled 2014-10-21: qty 2

## 2014-10-21 MED ORDER — SODIUM CHLORIDE 0.9 % IV SOLN: 250.0000 mL | INTRAVENOUS | Status: DC | PRN

## 2014-10-21 MED ORDER — LIDOCAINE HCL (CARDIAC) 20 MG/ML IV SOLN
INTRAVENOUS | Status: AC
  Filled 2014-10-21: qty 5

## 2014-10-21 MED ORDER — CALCIUM GLUCONATE 10 % IV SOLN: 1.0000 g | Freq: Once | INTRAVENOUS | Status: DC

## 2014-10-21 MED ORDER — METHYLPREDNISOLONE SODIUM SUCC 40 MG IJ SOLR
30.0000 mg | Freq: Two times a day (BID) | INTRAMUSCULAR | Status: DC
  Administered 2014-10-21 – 2014-10-23 (×5): 30 mg via INTRAVENOUS
  Filled 2014-10-21 (×6): qty 0.75

## 2014-10-21 MED ORDER — SODIUM CHLORIDE 0.9 % IV SOLN: 1.0000 g | Freq: Once | INTRAVENOUS | Status: DC

## 2014-10-21 MED ORDER — DEXTROSE 10 % IV SOLN
INTRAVENOUS | Status: DC
  Administered 2014-10-21 – 2014-10-22 (×2): via INTRAVENOUS

## 2014-10-21 MED ORDER — FENTANYL CITRATE 0.05 MG/ML IJ SOLN
25.0000 ug | INTRAMUSCULAR | Status: DC | PRN
  Administered 2014-10-23: 100 ug via INTRAVENOUS
  Filled 2014-10-21: qty 2

## 2014-10-21 MED ORDER — VANCOMYCIN HCL IN DEXTROSE 1-5 GM/200ML-% IV SOLN
1000.0000 mg | Freq: Once | INTRAVENOUS | Status: AC
  Administered 2014-10-21: 1000 mg via INTRAVENOUS

## 2014-10-21 MED ORDER — CHLORHEXIDINE GLUCONATE 0.12 % MT SOLN
15.0000 mL | Freq: Two times a day (BID) | OROMUCOSAL | Status: DC
  Administered 2014-10-21 – 2014-10-23 (×5): 15 mL via OROMUCOSAL
  Filled 2014-10-21 (×5): qty 15

## 2014-10-21 MED ORDER — PIPERACILLIN-TAZOBACTAM IN DEX 2-0.25 GM/50ML IV SOLN
2.2500 g | Freq: Three times a day (TID) | INTRAVENOUS | Status: DC
  Administered 2014-10-21 – 2014-10-23 (×6): 2.25 g via INTRAVENOUS
  Filled 2014-10-21 (×11): qty 50

## 2014-10-21 MED ORDER — SUCCINYLCHOLINE CHLORIDE 20 MG/ML IJ SOLN
INTRAMUSCULAR | Status: AC
  Filled 2014-10-21: qty 1

## 2014-10-21 MED ORDER — DEXTROSE 50 % IV SOLN
INTRAVENOUS | Status: AC
  Filled 2014-10-21: qty 50

## 2014-10-21 MED ORDER — SODIUM CHLORIDE 0.9 % IV BOLUS (SEPSIS)
1000.0000 mL | Freq: Once | INTRAVENOUS | Status: DC
  Administered 2014-10-21: 1000 mL via INTRAVENOUS

## 2014-10-21 MED ORDER — VANCOMYCIN HCL IN DEXTROSE 1-5 GM/200ML-% IV SOLN
1000.0000 mg | Freq: Once | INTRAVENOUS | Status: DC
  Filled 2014-10-21: qty 200

## 2014-10-21 MED ORDER — PIPERACILLIN-TAZOBACTAM 3.375 G IVPB 30 MIN
3.3750 g | Freq: Once | INTRAVENOUS | Status: AC
  Administered 2014-10-21: 3.375 g via INTRAVENOUS
  Filled 2014-10-21: qty 50

## 2014-10-21 MED ORDER — CETYLPYRIDINIUM CHLORIDE 0.05 % MT LIQD
7.0000 mL | Freq: Four times a day (QID) | OROMUCOSAL | Status: DC
  Administered 2014-10-21 – 2014-10-23 (×9): 7 mL via OROMUCOSAL

## 2014-10-21 MED ORDER — DEXTROSE 50 % IV SOLN
50.0000 mL | Freq: Once | INTRAVENOUS | Status: AC
  Administered 2014-10-21: 50 mL via INTRAVENOUS

## 2014-10-21 MED ORDER — SODIUM CHLORIDE 0.9 % IV BOLUS (SEPSIS): 500.0000 mL | INTRAVENOUS | Status: DC

## 2014-10-21 MED ORDER — CALCIUM GLUCONATE 10 % IV SOLN
2.0000 g | Freq: Once | INTRAVENOUS | Status: DC
  Administered 2014-10-21: 2 g via INTRAVENOUS
  Filled 2014-10-21: qty 20

## 2014-10-21 MED ORDER — VANCOMYCIN HCL 500 MG IV SOLR
500.0000 mg | INTRAVENOUS | Status: DC
  Filled 2014-10-21: qty 500

## 2014-10-21 MED ORDER — FAMOTIDINE IN NACL 20-0.9 MG/50ML-% IV SOLN
20.0000 mg | INTRAVENOUS | Status: DC
  Administered 2014-10-21 – 2014-10-23 (×3): 20 mg via INTRAVENOUS
  Filled 2014-10-21 (×4): qty 50

## 2014-10-21 MED ORDER — VITAL HIGH PROTEIN PO LIQD
1000.0000 mL | ORAL | Status: DC
Start: 2014-10-21 — End: 2014-10-22
  Administered 2014-10-21 – 2014-10-22 (×2): 1000 mL
  Filled 2014-10-21 (×3): qty 1000

## 2014-10-21 MED FILL — Medication: Qty: 1 | Status: AC

## 2014-10-21 NOTE — ED Notes (Signed)
Dr Claudine Mouton given a copy of troponin results .10

## 2014-10-21 NOTE — ED Notes (Signed)
Attempt made to inserted foley by Evern Core, RN.  Unable to insert, bleeding noted

## 2014-10-21 NOTE — Assessment & Plan Note (Signed)
Transmetatarsal amputation on R; wound care following

## 2014-10-21 NOTE — Assessment & Plan Note (Signed)
noted 

## 2014-10-21 NOTE — Assessment & Plan Note (Signed)
Continue colchicine °

## 2014-10-21 NOTE — Assessment & Plan Note (Addendum)
Wound care nurse reports s/p L great toe amputation and some eschar on L foot; area was re dressed; pt on plavix

## 2014-10-21 NOTE — Progress Notes (Signed)
ANTIBIOTIC CONSULT NOTE - INITIAL  Pharmacy Consult for vancomycin and zosyn  Indication: sepsis  Allergies  Allergen Reactions  . Allopurinol Other (See Comments)    REACTION: decreased platelets    Patient Measurements:   Adjusted Body Weight:   Vital Signs: Temp: 97.2 F (36.2 C) (01/29 1920) BP: 111/65 mmHg (01/29 1920) Pulse Rate: 83 (01/29 1920) Intake/Output from previous day:   Intake/Output from this shift:    Labs:  Recent Labs  10/04/2014 0648  HGB 11.6*  CREATININE 5.40*   Estimated Creatinine Clearance: 9.7 mL/min (by C-G formula based on Cr of 5.4). No results for input(s): VANCOTROUGH, VANCOPEAK, VANCORANDOM, GENTTROUGH, GENTPEAK, GENTRANDOM, TOBRATROUGH, TOBRAPEAK, TOBRARND, AMIKACINPEAK, AMIKACINTROU, AMIKACIN in the last 72 hours.   Microbiology: No results found for this or any previous visit (from the past 720 hour(s)).  Medical History: Past Medical History  Diagnosis Date  . ESRD on hemodialysis     a. ESRD 2/2 HTN with renal transplant in 1987 (cadaveric) after short period of dialysis;  b. Transplant failed in 2004 and he went back on HD;  c. As of 10/15 getting HD via L thigh AVG on a TTS schedule at [facility].  . Hypertension   . Hx of kidney transplant     a. 1987-> back on HD since 2004  . Gout tophi   . Chronic steroid use     a. Has severe gout. Did not tolerate Allopurinol. Do not taper per PCP   . Systolic CHF, chronic     2 D echo 04/2012 with EF of 45 %   . Malnutrition   . Hx SBO 04/2012    a. 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 F/U with Dr Donne Hazel. Started on TNA  . Anemia associated with chronic renal failure   . Hepatitis C   . GERD (gastroesophageal reflux disease)   . H/O hiatal hernia   . Arthritis   . History of DVT (deep vein thrombosis)   . Peripheral vascular disease     a. 12/2013 PTA of L Pop;  b. 12/2013 PTA R pop, R DP;   c. 06/2014 L Pop CBA/DCB PTA.  . Prostate cancer   . CAD (coronary artery disease)     a. 12/2013 Cath/PCI: EF 45-50%, LM Ca2+, LAD 30-40p, 83m, D1/2/3 min irregs, LCX 50ost, 30-56m, RCA 70p, 95/84m (Rota->3.0x23 Xience distal, 3.0x23 Xience mid, 3.0x28 Xience prox).  . Chronic diastolic CHF (congestive heart failure)     a. 12/2013 Echo: EF 55-60%, mild LVH, nl wall motion, Gr 2 DD.  . Dry gangrene     a. L great toe    Medications:   (Not in a hospital admission) Assessment: 54 yo HD patient with sepsis, BG 15 on arrival, unresponsive. Code sepsis initated. vanc 1gm and zosyn 3.375 gm ordered for ED. 1 gm is appropriate 25mg  /kg for pre-hd dose.   Goal of Therapy:  Vanc 20-25 gm pre-HD  Plan:  Zosyn 2.25gm q8h  vanc 500mg  after each HD  Curlene Dolphin 09/29/2014,6:52 AM

## 2014-10-21 NOTE — ED Notes (Signed)
Pt CBG 15. Informed RN

## 2014-10-21 NOTE — Progress Notes (Signed)
Family bedside with [name].   Noted that pt's mother is 56 years old, and still does for herself. Struggling with when to inform her.   Family has begun anticipating grief, they present that they not very hopeful for a turnaround.   Pt's sister had been caring for him before his time in the home. They are very very close. She is aware of his wishes. All of the family emphatically agree that they do not want him to suffer.   Delford Field, Chaplain 09/24/2014

## 2014-10-21 NOTE — Assessment & Plan Note (Addendum)
On phone with DON for orders for pt It was not clear on d/c summary exactly what dose steroids pt was on so pt put on 20 mg daily;now that I see note in EPIC will increase back to 25 mg

## 2014-10-21 NOTE — Progress Notes (Signed)
Subjective: Interval History: Found nonresponsive with low bs,  entub to protect airway. Has not come around.  D/c 2 d ago post TMA on R .  Has ray amp onL .  Other prob: ESRD, Hep C , polyartic gout tophaceous, HTN, Anemia, HPTH, Prostate Ca, short bowel s/p multiple resx, malnutrition,  Severe PAD,  CAD. Marland Kitchen  Objective: Vital signs in last 24 hours: Temp:  [91.9 F (33.3 C)-97.2 F (36.2 C)] 93 F (33.9 C) (01/30 0920) Pulse Rate:  [47-86] 78 (01/30 1030) Resp:  [12-24] 17 (01/30 1030) BP: (84-149)/(22-85) 87/22 mmHg (01/30 1030) SpO2:  [66 %-100 %] 90 % (01/30 1030) FiO2 (%):  [60 %-100 %] 60 % (01/30 1030) Weight:  [40.824 kg (90 lb)-42.4 kg (93 lb 7.6 oz)] 42.4 kg (93 lb 7.6 oz) (01/30 0920) Weight change:   Intake/Output from previous day:   Intake/Output this shift: Total I/O In: 100 [I.V.:100] Out: 0   Gen: not responsive Lungs: scattered rhonchi CV reg gr 2/6 SEM, without edema Abdm many scars, pos bs, liver down 5 cm EXtrem : AVG L groin, B&T, R TMA , no DP .  L 1st ray amp, multiple eschars, scaling, trophic change both LE  Lab Results:  Recent Labs  10/12/2014 0630 10/12/2014 0648  WBC 11.9*  --   HGB 8.8* 11.6*  HCT 29.3* 34.0*  PLT 71*  --    BMET:  Recent Labs  09/29/2014 0630 10/19/2014 0648  NA 138 137  K 4.7 4.6  CL 96 95*  CO2 33*  --   GLUCOSE 160* 150*  BUN 30* 31*  CREATININE 5.45* 5.40*  CALCIUM 7.4*  --    No results for input(s): PTH in the last 72 hours. Iron Studies: No results for input(s): IRON, TIBC, TRANSFERRIN, FERRITIN in the last 72 hours.  Studies/Results: Ct Head Wo Contrast  10/16/2014   CLINICAL DATA:  unresponsive  .  Hypoglycemic coma  EXAM: CT HEAD WITHOUT CONTRAST  TECHNIQUE: Contiguous axial images were obtained from the base of the skull through the vertex without intravenous contrast.  COMPARISON:  None  available  FINDINGS: Atherosclerotic and physiologic intracranial calcifications. Scattered tentorial and falcine  calcifications. Mild atrophy. There is no evidence of acute intracranial hemorrhage, brain edema, mass lesion, acute infarction, mass effect, or midline shift. Acute infarct may be inapparent on noncontrast CT. No other intra-axial abnormalities are seen, and the ventricles and sulci are within normal limits in size and symmetry. No abnormal extra-axial fluid collections or masses are identified. No significant calvarial abnormality.  IMPRESSION: 1. Negative for bleed or other acute intracranial process.   Electronically Signed   By: Arne Cleveland M.D.   On: 10/02/2014 09:24   Dg Chest Port 1 View  09/28/2014   CLINICAL DATA:  Intubation and OG tube placement.  EXAM: PORTABLE CHEST - 1 VIEW  COMPARISON:  09/24/2014  FINDINGS: Interval placement of an endotracheal tube with tip measuring 5.9 cm above the carinal. Enteric tube was placed with tip off the field of view but below the left hemidiaphragm. Normal heart size and pulmonary vascularity. Left pleural effusion has decreased since previous study. Calcification in the lung apices, likely postinflammatory. Pulmonary hyperinflation likely due to emphysema. Vascular calcifications in the upper abdomen.  IMPRESSION: Appliances appear in satisfactory position. Emphysematous changes in the lungs with calcification in the apices, likely postinflammatory. Decrease left pleural effusion since prior study   Electronically Signed   By: Lucienne Capers M.D.   On: 10/05/2014  07:00    I have reviewed the patient's current medications. Prior to Admission:  Prescriptions prior to admission  Medication Sig Dispense Refill Last Dose  . amLODipine (NORVASC) 5 MG tablet Take 2 tablets (10 mg total) by mouth daily. (Patient taking differently: Take 5 mg by mouth daily. ) 30 tablet 0 10/20/2014 at Unknown time  . atorvastatin (LIPITOR) 40 MG tablet TAKE 1 TABLET BY MOUTH DAILY AT 6 PM 30 tablet 0 10/20/2014 at Unknown time  . calcium acetate (PHOSLO) 667 MG tablet Take 3  tablets by mouth 3 (three) times daily. 270 tablet 0 10/20/2014 at Unknown time  . carvedilol (COREG) 6.25 MG tablet Take 1 tablet (6.25 mg total) by mouth 2 (two) times daily with a meal. 60 tablet 9 10/20/2014 at 1600  . cinacalcet (SENSIPAR) 60 MG tablet Take 1 tablet (60 mg total) by mouth daily. 30 tablet 0 10/20/2014 at Unknown time  . clopidogrel (PLAVIX) 75 MG tablet Take 75 mg by mouth daily.    unknown  . colchicine (COLCRYS) 0.6 MG tablet Take 0.5 tablets (0.3 mg total) by mouth 2 (two) times a week. On Monday and Thursday 60 tablet 0 10/19/2014 at Unknown time  . diphenhydrAMINE (BENADRYL) 25 MG tablet Take 1 tablet (25 mg total) by mouth every 6 (six) hours as needed. 20 tablet 0 Past Month at Unknown time  . HYDROmorphone (DILAUDID) 2 MG tablet Take 1 tablet (2 mg total) by mouth every 6 (six) hours as needed for severe pain. 30 tablet 0 unknown  . metoCLOPramide (REGLAN) 10 MG tablet Take 1 tablet (10 mg total) by mouth every 8 (eight) hours as needed for nausea or vomiting. 30 tablet 0 Past Week at Unknown time  . multivitamin (RENA-VIT) TABS tablet Take 1 tablet by mouth daily. 30 tablet 0 10/20/2014 at Unknown time  . Nutritional Supplements (FEEDING SUPPLEMENT, NEPRO CARB STEADY,) LIQD Take 237 mLs by mouth as needed (missed meal during dialysis.). 30 Can 0 10/20/2014 at Unknown time  . oxyCODONE (ROXICODONE) 5 MG immediate release tablet Take 1-2 tablets (5-10 mg total) by mouth every 6 (six) hours as needed for severe pain. 60 tablet 0 10/20/2014 at Unknown time  . pantoprazole (PROTONIX) 40 MG tablet Take 1 tablet (40 mg total) by mouth daily. 30 tablet 0 10/20/2014 at Unknown time  . predniSONE (DELTASONE) 10 MG tablet Take 2 tablets (20 mg total) by mouth daily. 10 tablet 0 10/20/2014 at Unknown time  . simethicone (MYLICON) 40 CM/0.3KJ drops Take 0.6 mLs (40 mg total) by mouth 4 (four) times daily as needed for flatulence. 30 mL 0 10/20/2014 at Unknown time  . polyethylene glycol  (MIRALAX / GLYCOLAX) packet Take 17 g by mouth daily. (Patient not taking: Reported on 10/13/2014) 30 each 0 Not Taking at Unknown time  . predniSONE (DELTASONE) 5 MG tablet Take 1 tablet (5 mg total) by mouth daily. (Patient not taking: Reported on 09/28/2014) 30 tablet 0 Completed Course at Unknown time  . senna-docusate (SENOKOT-S) 8.6-50 MG per tablet Take 1 tablet by mouth 2 (two) times daily as needed for mild constipation. (Patient not taking: Reported on 10/13/2014) 30 tablet 0 Not Taking at Unknown time    Assessment/Plan: 1  ESRD will hold of HD as bp low and no firm indic for HD 2 Anemia stable follow 3 HPTH 4 Gout  5 Severe debill progressive over past 2 yr but has functioned well.  Has gone down more esp with PVD exacerbating.  Now in  care facility and dependent 6 low bs and still not responsive see if responds. He is steroid dependent and will add 7 Malnutrition 8 Short bowel 9 CAD 10 Hep C P steroids, supportive care,  If not marked improvement ,withdraw tx.    LOS: 0 days   Andree Golphin L 10/20/2014,11:12 AM

## 2014-10-21 NOTE — Progress Notes (Signed)
Chaplain visiting with pt's sister and nephew.   Pt's sister is very teary-eyed. Nephew noted that "it has been a very long road" for his uncle.  " Im trying to be positive for her"   One more family member is on the way, family is in consult B.   Delford Field, Chaplain 09/22/2014

## 2014-10-21 NOTE — Assessment & Plan Note (Deleted)
HD TTS , slowly lower wgts to lower meds

## 2014-10-21 NOTE — ED Provider Notes (Signed)
CSN: [id number]     Arrival date & time 10/09/2014  0622 History   First MD Initiated Contact with Patient 09/29/2014 [phone]     Chief Complaint  Patient presents with  . Unresponsive      (Consider location/radiation/quality/duration/timing/severity/associated sxs/prior Treatment) HPI   [name] is a 54yo male, PMH ESRD, failed renal txp, on HD, CHF, DVT, Hep C presenting today with AMS.  He was transferred from nursing home.  He is unable to provide his own history due to AMS.  Per EMS, patient is full code, found to have FS of 15.  D50 given and patient woke up a little bit.  No further details given by nursing facility.   Past Medical History  Diagnosis Date  . ESRD on hemodialysis     a. ESRD 2/2 HTN with renal transplant in 1987 (cadaveric) after short period of dialysis;  b. Transplant failed in 2004 and he went back on HD;  c. As of 10/15 getting HD via L thigh AVG on a TTS schedule at [facility] on [facility].  . Hypertension   . Hx of kidney transplant     a. 1987-> back on HD since 2004  . Gout tophi   . Chronic steroid use     a. Has severe gout. Did not tolerate Allopurinol. Do not taper per PCP   . Systolic CHF, chronic     2 D echo 04/2012 with EF of 45 %   . Malnutrition   . Hx SBO 04/2012    a. 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 F/U with Dr Donne Hazel. Started on TNA  . Anemia associated with chronic renal failure   . Hepatitis C   . GERD (gastroesophageal reflux disease)   . H/O hiatal hernia   . Arthritis   . History of DVT (deep vein thrombosis)   . Peripheral vascular disease     a. 12/2013 PTA of L Pop;  b. 12/2013 PTA R pop, R DP;  c. 06/2014 L Pop CBA/DCB PTA.  . Prostate cancer   . CAD (coronary artery disease)     a. 12/2013 Cath/PCI: EF 45-50%, LM Ca2+, LAD 30-40p, 5m, D1/2/3 min irregs, LCX 50ost, 30-66m, RCA 70p, 95/24m (Rota->3.0x23 Xience distal, 3.0x23 Xience mid, 3.0x28  Xience prox).  . Chronic diastolic CHF (congestive heart failure)     a. 12/2013 Echo: EF 55-60%, mild LVH, nl wall motion, Gr 2 DD.  . Dry gangrene     a. L great toe   Past Surgical History  Procedure Laterality Date  . Laparotomy  04/14/2012    Procedure: EXPLORATORY LAPAROTOMY;  Surgeon: Joyice Faster. Cornett, MD;  Location: [place];  Service: General;  Laterality: N/A;  . Colon resection  04/14/2012  . Laparotomy  04/22/2012    Procedure: EXPLORATORY LAPAROTOMY;  Surgeon: Adin Hector, MD;  Location: [place];  Service: General;  Laterality: N/A;  lysis of adhesions, omentoplasty, repair small bowel  . Thrombectomy w/ embolectomy  04/27/2012    Procedure: THROMBECTOMY ARTERIOVENOUS GORE-TEX GRAFT;  Surgeon: Angelia Mould, MD;  Location: [place];  Service: Vascular;  Laterality: Left;  Thrombectomy of left thigh arteriovenous gortex graft  . Prostectomy  2011  . Renal grafts    . Pelvic abcess drainage Right 6/14    removal drain s/p bowl resection 13  . Transanal excision of rectal mass N/A 03/30/2013    Procedure: EXCISION  OF anal MASS;  Surgeon: Joyice Faster. Cornett, MD;  Location: [place];  Service: General;  Laterality: N/A;  Exam under anesthesia with excision anal verge mass  . Aortogram  08/15/2014    abd aortogram  . Femoral-popliteal bypass graft Right 08/18/2014    Procedure: BYPASS GRAFT FEMORAL-POPLITEAL ARTERY with Gortex Graft;  Surgeon: Serafina Mitchell, MD;  Location: [place];  Service: Vascular;  Laterality: Right;  . Shuntogram Left 09/29/2012    Procedure: SHUNTOGRAM;  Surgeon: Serafina Mitchell, MD;  Location: [facility] CATH LAB;  Service: Cardiovascular;  Laterality: Left;  . Abdominal aortagram N/A 01/03/2014    Procedure: ABDOMINAL Maxcine Ham;  Surgeon: Serafina Mitchell, MD;  Location: [facility] CATH LAB;  Service: Cardiovascular;  Laterality: N/A;  . Abdominal aortagram Right 01/11/2014    Procedure: ABDOMINAL AORTAGRAM;  Surgeon: Serafina Mitchell, MD;  Location: [facility] CATH LAB;  Service:  Cardiovascular;  Laterality: Right;  . Left heart catheterization with coronary angiogram N/A 01/12/2014    Procedure: LEFT HEART CATHETERIZATION WITH CORONARY ANGIOGRAM;  Surgeon: Leonie Man, MD;  Location: [facility] CATH LAB;  Service: Cardiovascular;  Laterality: N/A;  . Percutaneous coronary rotoblator intervention (pci-r) N/A 01/13/2014    Procedure: PERCUTANEOUS CORONARY ROTOBLATOR INTERVENTION (PCI-R);  Surgeon: Burnell Blanks, MD;  Location: [facility] CATH LAB;  Service: Cardiovascular;  Laterality: N/A;  . Abdominal aortagram N/A 07/19/2014    Procedure: ABDOMINAL AORTAGRAM;  Surgeon: Serafina Mitchell, MD;  Location: [facility] CATH LAB;  Service: Cardiovascular;  Laterality: N/A;  . Shuntogram N/A 08/24/2014    Procedure: Earney Mallet;  Surgeon: Conrad Pineville, MD;  Location: [facility] CATH LAB;  Service: Cardiovascular;  Laterality: N/A;  . Colonoscopy N/A 09/11/2014    Procedure: COLONOSCOPY;  Surgeon: Inda Castle, MD;  Location: [place];  Service: Endoscopy;  Laterality: N/A;  . Amputation Left 09/12/2014    Procedure: AMPUTATION DIGIT-LEFT GREAT TOE;  Surgeon: Serafina Mitchell, MD;  Location: [place];  Service: Vascular;  Laterality: Left;  . Flexible sigmoidoscopy N/A 09/21/2014    Procedure: FLEXIBLE SIGMOIDOSCOPY;  Surgeon: Gatha Mayer, MD;  Location: [place];  Service: Endoscopy;  Laterality: N/A;  . Colonoscopy    . Eye surgery Bilateral     cataracts removed with lens implant  . Amputation Right 10/13/2014    Procedure: Right Foot Transmetatarsal Amputation;  Surgeon: Newt Minion, MD;  Location: [place];  Service: Orthopedics;  Laterality: Right;   Family History  Problem Relation Age of Onset  . Hypertension Father   . Pneumonia Brother   . Lung disease Brother    History  Substance Use Topics  . Smoking status: Former Smoker -- 1.00 packs/day for 10 years    Types: Cigarettes    Quit date: 03/23/1973  . Smokeless tobacco: Never Used     Comment: Quit 30 years ago  .  Alcohol Use: No    Review of Systems  Unable to perform ROS: Mental status change      Allergies  Allopurinol  Home Medications   Prior to Admission medications   Medication Sig Start Date End Date Taking? Authorizing Provider  amLODipine (NORVASC) 5 MG tablet Take 2 tablets (10 mg total) by mouth daily. Patient taking differently: Take 5 mg by mouth daily.  09/05/14   Shanker Kristeen Mans, MD  atorvastatin (LIPITOR) 40 MG tablet TAKE 1 TABLET BY MOUTH DAILY AT 6 PM 09/05/14   Shanker Kristeen Mans, MD  calcium acetate (PHOSLO) 667 MG tablet Take 3 tablets  by mouth 3 (three) times daily. 09/05/14   Shanker Kristeen Mans, MD  carvedilol (COREG) 6.25 MG tablet Take 1 tablet (6.25 mg total) by mouth 2 (two) times daily with a meal. 09/05/14   Jonetta Osgood, MD  cinacalcet (SENSIPAR) 60 MG tablet Take 1 tablet (60 mg total) by mouth daily. 09/05/14   Shanker Kristeen Mans, MD  clopidogrel (PLAVIX) 75 MG tablet Take 75 mg by mouth daily.  09/07/14   Historical Provider, MD  colchicine (COLCRYS) 0.6 MG tablet Take 0.5 tablets (0.3 mg total) by mouth 2 (two) times a week. On Monday and Thursday 09/05/14   Jonetta Osgood, MD  diphenhydrAMINE (BENADRYL) 25 MG tablet Take 1 tablet (25 mg total) by mouth every 6 (six) hours as needed. 09/24/14   Tiffany Marilu Favre, PA-C  HYDROmorphone (DILAUDID) 2 MG tablet Take 1 tablet (2 mg total) by mouth every 6 (six) hours as needed for severe pain. 10/18/14   Newt Minion, MD  metoCLOPramide (REGLAN) 10 MG tablet Take 1 tablet (10 mg total) by mouth every 8 (eight) hours as needed for nausea or vomiting. 09/05/14   Jonetta Osgood, MD  multivitamin (RENA-VIT) TABS tablet Take 1 tablet by mouth daily. 09/05/14   Shanker Kristeen Mans, MD  Nutritional Supplements (FEEDING SUPPLEMENT, NEPRO CARB STEADY,) LIQD Take 237 mLs by mouth as needed (missed meal during dialysis.). Patient not taking: Reported on 10/13/2014 09/05/14   Jonetta Osgood, MD  oxyCODONE (ROXICODONE) 5 MG  immediate release tablet Take 1-2 tablets (5-10 mg total) by mouth every 6 (six) hours as needed for severe pain. 10/18/14   Newt Minion, MD  pantoprazole (PROTONIX) 40 MG tablet Take 1 tablet (40 mg total) by mouth daily. 09/05/14   Shanker Kristeen Mans, MD  polyethylene glycol (MIRALAX / GLYCOLAX) packet Take 17 g by mouth daily. Patient not taking: Reported on 10/13/2014 09/05/14   Jonetta Osgood, MD  predniSONE (DELTASONE) 10 MG tablet Take 2 tablets (20 mg total) by mouth daily. Patient not taking: Reported on 10/13/2014 09/24/14   Linus Mako, PA-C  predniSONE (DELTASONE) 5 MG tablet Take 1 tablet (5 mg total) by mouth daily. 09/05/14   Shanker Kristeen Mans, MD  senna-docusate (SENOKOT-S) 8.6-50 MG per tablet Take 1 tablet by mouth 2 (two) times daily as needed for mild constipation. Patient not taking: Reported on 10/13/2014 09/05/14   Jonetta Osgood, MD  simethicone (MYLICON) 40 OH/6.0VP drops Take 0.6 mLs (40 mg total) by mouth 4 (four) times daily as needed for flatulence. 09/05/14   Shanker Kristeen Mans, MD   BP 100/58 mmHg  Pulse 69  Temp(Src) 91.9 F (33.3 C) (Rectal)  Resp 24  Ht 5\' 10"  (1.778 m)  Wt 90 lb (40.824 kg)  BMI 12.91 kg/m2  SpO2 100% Physical Exam  Constitutional: Vital signs are normal. He appears well-developed and well-nourished. He appears toxic. He appears ill.  Cachetic appearance  HENT:  Head: Normocephalic and atraumatic.  Nose: Nose normal.  Mouth/Throat: Oropharynx is clear and moist. No oropharyngeal exudate.  Eyes: Conjunctivae and EOM are normal. Pupils are equal, round, and reactive to light. No scleral icterus.  Neck: Normal range of motion. Neck supple. No tracheal deviation, no edema, no erythema and normal range of motion present. No thyroid mass and no thyromegaly present.  Cardiovascular: Normal rate, regular rhythm, S1 normal, S2 normal, normal heart sounds, intact distal pulses and normal pulses.  Exam reveals no gallop and no friction rub.  No murmur heard. Pulses:      Radial pulses are 2+ on the right side, and 2+ on the left side.       Dorsalis pedis pulses are 2+ on the right side, and 2+ on the left side.  Pulmonary/Chest: He is in respiratory distress. He has no rhonchi.  Accessory muscle use  Abdominal: Soft. Normal appearance and bowel sounds are normal. He exhibits no distension, no ascites and no mass. There is no hepatosplenomegaly. There is no tenderness. There is no rebound, no guarding and no CVA tenderness.  Musculoskeletal: Normal range of motion. He exhibits no edema or tenderness.  BUE failed AVF.  LLE AVF with palpable thrill  Lymphadenopathy:    He has no cervical adenopathy.  Neurological: He is alert. He has normal strength.  Patient does not follow commands  Skin: Skin is warm, dry and intact. No petechiae and no rash noted. He is not diaphoretic. No erythema. No pallor.  Nursing note and vitals reviewed.   ED Course  Procedures (including critical care time) Labs Review Labs Reviewed  CBG MONITORING, ED - Abnormal; Notable for the following:    Glucose-Capillary 15 (*)    All other components within normal limits  I-STAT TROPOININ, ED - Abnormal; Notable for the following:    Troponin i, poc 0.10 (*)    All other components within normal limits  I-STAT CHEM 8, ED - Abnormal; Notable for the following:    Chloride 95 (*)    BUN 31 (*)    Creatinine, Ser 5.40 (*)    Glucose, Bld 150 (*)    Calcium, Ion 0.99 (*)    Hemoglobin 11.6 (*)    HCT 34.0 (*)    All other components within normal limits  I-STAT ARTERIAL BLOOD GAS, ED - Abnormal; Notable for the following:    pH, Arterial 7.497 (*)    pO2, Arterial 487.0 (*)    Bicarbonate 33.9 (*)    Acid-Base Excess 10.0 (*)    All other components within normal limits  CULTURE, BLOOD (ROUTINE X 2)  CULTURE, BLOOD (ROUTINE X 2)  CBC WITH DIFFERENTIAL/PLATELET  COMPREHENSIVE METABOLIC PANEL  I-STAT CG4 LACTIC ACID, ED    Imaging Review Dg  Chest Port 1 View  10/09/2014   CLINICAL DATA:  Intubation and OG tube placement.  EXAM: PORTABLE CHEST - 1 VIEW  COMPARISON:  09/24/2014  FINDINGS: Interval placement of an endotracheal tube with tip measuring 5.9 cm above the carinal. Enteric tube was placed with tip off the field of view but below the left hemidiaphragm. Normal heart size and pulmonary vascularity. Left pleural effusion has decreased since previous study. Calcification in the lung apices, likely postinflammatory. Pulmonary hyperinflation likely due to emphysema. Vascular calcifications in the upper abdomen.  IMPRESSION: Appliances appear in satisfactory position. Emphysematous changes in the lungs with calcification in the apices, likely postinflammatory. Decrease left pleural effusion since prior study   Electronically Signed   By: Lucienne Capers M.D.   On: 10/14/2014 07:00     EKG Interpretation   Date/Time:  Saturday October 21 2014 07:01:06 EST Ventricular Rate:  71 PR Interval:  120 QRS Duration: 79 QT Interval:  503 QTC Calculation: 547 R Axis:   75 Text Interpretation:  Sinus rhythm Nonspecific T abnormalities, lateral  leads Prolonged QT interval Confirmed by Glynn Octave [phone]) on  10/12/2014 7:14:14 AM      MDM   Final diagnoses:  Altered mental status    Patient presents  tot he ED for AMS.  After amp of D50, his sugar went up to 100s, then repeat fell back down to 20s.  He was immediately given another amp of D50.  IV access was obtained, patient was intubated and sepsis protocol started. Critical care was consulted for admission.  CRITICAL CARE Performed by: Everlene Balls   Total critical care time: 30 min  Critical care time was exclusive of separately billable procedures and treating other patients.  Critical care was necessary to treat or prevent imminent or life-threatening deterioration.  Critical care was time spent personally by me on the following activities: development of  treatment plan with patient and/or surrogate as well as nursing, discussions with consultants, evaluation of patient's response to treatment, examination of patient, obtaining history from patient or surrogate, ordering and performing treatments and interventions, ordering and review of laboratory studies, ordering and review of radiographic studies, pulse oximetry and re-evaluation of patient's condition.   INTUBATION Performed by: Everlene Balls  Required items: required blood products, implants, devices, and special equipment available Patient identity confirmed: provided demographic data and hospital-assigned identification number Time out: Immediately prior to procedure a "time out" was called to verify the correct patient, procedure, equipment, support staff and site/side marked as required.  Indications: respiratory distress, AMS  Intubation method: Direct Laryngoscopy   Preoxygenation: 100% BVM  Sedatives: 20mg Etomidate Paralytic: 100mg  Rocuronium   Tube Size: 8-0 cuffed  Post-procedure assessment: chest rise and ETCO2 monitor Breath sounds: equal and absent over the epigastrium Tube secured with: ETT holder Chest x-ray interpreted by radiologist and me.  Chest x-ray findings: endotracheal tube in appropriate position  Patient tolerated the procedure well with no immediate complications.     Everlene Balls, MD 10/11/2014 [phone]

## 2014-10-21 NOTE — Progress Notes (Signed)
Dr. Alva Garnet notified that right foot has no pulse. No new orders.

## 2014-10-21 NOTE — Assessment & Plan Note (Signed)
Controlled on low dose norvasc and low dose coreg

## 2014-10-21 NOTE — H&P (Signed)
PULMONARY / CRITICAL CARE MEDICINE   Name: [name] MRN: [id number] DOB: 12-16-1960    ADMISSION DATE:  10/22/2014   INITIAL PRESENTATION:  NH resident with ESRD, severe PVD, adult FTT found on AM of admission unresponsive and transported to ED where he was found to be profoundly hypoglycemic. Intubated due to GCS 3. Made DNR after discussion with family  SIGNIFICANT EVENTS/STUDIES:  1/30 CT head: NAD    HISTORY OF PRESENT ILLNESS:   History as above. Pt unable to provide further history. Family informs me that he has been in failing health for years. He has expressed to them that he has lost his will to live. Nonetheless, he was still undergoing HD and had transmetatarsal amputation of R foot 8 days PTA.   PAST MEDICAL HISTORY :   has a past medical history of ESRD on hemodialysis; Hypertension; kidney transplant; Gout tophi; Chronic steroid use; Systolic CHF, chronic; Malnutrition; SBO (04/2012); Anemia associated with chronic renal failure; Hepatitis C; GERD (gastroesophageal reflux disease); H/O hiatal hernia; Arthritis; History of DVT (deep vein thrombosis); Peripheral vascular disease; Prostate cancer; CAD (coronary artery disease); Chronic diastolic CHF (congestive heart failure); and Dry gangrene.  has past surgical history that includes laparotomy (04/14/2012); Colon resection (04/14/2012); laparotomy (04/22/2012); Thrombectomy w/ embolectomy (04/27/2012); prostectomy (2011); renal grafts; Pelvic abcess drainage (Right, 6/14); Transanal excision of rectal mass (N/A, 03/30/2013); Aortogram (08/15/2014); Femoral-popliteal Bypass Graft (Right, 08/18/2014); shuntogram (Left, 09/29/2012); abdominal aortagram (N/A, 01/03/2014); abdominal aortagram (Right, 01/11/2014); left heart catheterization with coronary angiogram (N/A, 01/12/2014); percutaneous coronary rotoblator intervention (pci-r) (N/A, 01/13/2014); abdominal aortagram (N/A, 07/19/2014); shuntogram (N/A, 08/24/2014); Colonoscopy (N/A,  09/11/2014); Amputation (Left, 09/12/2014); Flexible sigmoidoscopy (N/A, 09/21/2014); Colonoscopy; Eye surgery (Bilateral); and Amputation (Right, 10/13/2014). Prior to Admission medications   Medication Sig Start Date End Date Taking? Authorizing Provider  amLODipine (NORVASC) 5 MG tablet Take 2 tablets (10 mg total) by mouth daily. Patient taking differently: Take 5 mg by mouth daily.  09/05/14  Yes Shanker Kristeen Mans, MD  atorvastatin (LIPITOR) 40 MG tablet TAKE 1 TABLET BY MOUTH DAILY AT 6 PM 09/05/14  Yes Shanker Kristeen Mans, MD  calcium acetate (PHOSLO) 667 MG tablet Take 3 tablets by mouth 3 (three) times daily. 09/05/14  Yes Shanker Kristeen Mans, MD  carvedilol (COREG) 6.25 MG tablet Take 1 tablet (6.25 mg total) by mouth 2 (two) times daily with a meal. 09/05/14  Yes Shanker Kristeen Mans, MD  cinacalcet (SENSIPAR) 60 MG tablet Take 1 tablet (60 mg total) by mouth daily. 09/05/14  Yes Shanker Kristeen Mans, MD  clopidogrel (PLAVIX) 75 MG tablet Take 75 mg by mouth daily.  09/07/14  Yes Historical Provider, MD  colchicine (COLCRYS) 0.6 MG tablet Take 0.5 tablets (0.3 mg total) by mouth 2 (two) times a week. On Monday and Thursday 09/05/14  Yes Shanker Kristeen Mans, MD  diphenhydrAMINE (BENADRYL) 25 MG tablet Take 1 tablet (25 mg total) by mouth every 6 (six) hours as needed. 09/24/14  Yes Tiffany Marilu Favre, PA-C  HYDROmorphone (DILAUDID) 2 MG tablet Take 1 tablet (2 mg total) by mouth every 6 (six) hours as needed for severe pain. 10/18/14  Yes Newt Minion, MD  metoCLOPramide (REGLAN) 10 MG tablet Take 1 tablet (10 mg total) by mouth every 8 (eight) hours as needed for nausea or vomiting. 09/05/14  Yes Shanker Kristeen Mans, MD  multivitamin (RENA-VIT) TABS tablet Take 1 tablet by mouth daily. 09/05/14  Yes Shanker Kristeen Mans, MD  Nutritional Supplements (FEEDING SUPPLEMENT, NEPRO CARB STEADY,)  LIQD Take 237 mLs by mouth as needed (missed meal during dialysis.). 09/05/14  Yes Shanker Kristeen Mans, MD  oxyCODONE  (ROXICODONE) 5 MG immediate release tablet Take 1-2 tablets (5-10 mg total) by mouth every 6 (six) hours as needed for severe pain. 10/18/14  Yes Newt Minion, MD  pantoprazole (PROTONIX) 40 MG tablet Take 1 tablet (40 mg total) by mouth daily. 09/05/14  Yes Shanker Kristeen Mans, MD  predniSONE (DELTASONE) 10 MG tablet Take 2 tablets (20 mg total) by mouth daily. 09/24/14  Yes Tiffany Marilu Favre, PA-C  simethicone (MYLICON) 40 GL/8.7FI drops Take 0.6 mLs (40 mg total) by mouth 4 (four) times daily as needed for flatulence. 09/05/14  Yes Shanker Kristeen Mans, MD  polyethylene glycol (MIRALAX / GLYCOLAX) packet Take 17 g by mouth daily. Patient not taking: Reported on 10/13/2014 09/05/14   Jonetta Osgood, MD  predniSONE (DELTASONE) 5 MG tablet Take 1 tablet (5 mg total) by mouth daily. Patient not taking: Reported on 10/04/2014 09/05/14   Jonetta Osgood, MD  senna-docusate (SENOKOT-S) 8.6-50 MG per tablet Take 1 tablet by mouth 2 (two) times daily as needed for mild constipation. Patient not taking: Reported on 10/13/2014 09/05/14   Jonetta Osgood, MD   Allergies  Allergen Reactions  . Allopurinol Other (See Comments)    REACTION: decreased platelets    FAMILY HISTORY:  indicated that his mother is deceased. He indicated that his father is deceased. He indicated that his sister is alive. He indicated that his brother is deceased.  SOCIAL HISTORY:  reports that he quit smoking about 41 years ago. His smoking use included Cigarettes. He has a 10 pack-year smoking history. He has never used smokeless tobacco. He reports that he does not drink alcohol or use illicit drugs.  REVIEW OF SYSTEMS:  N/A  SUBJECTIVE:   VITAL SIGNS: Temp:  [91.9 F (33.3 C)-97.2 F (36.2 C)] 93 F (33.9 C) (01/30 0920) Pulse Rate:  [47-86] 81 (01/30 1144) Resp:  [12-24] 18 (01/30 1144) BP: (73-149)/(22-85) 73/24 mmHg (01/30 1144) SpO2:  [66 %-100 %] 96 % (01/30 1144) FiO2 (%):  [60 %-100 %] 60 % (01/30  1144) Weight:  [40.824 kg (90 lb)-42.4 kg (93 lb 7.6 oz)] 42.4 kg (93 lb 7.6 oz) (01/30 0920) HEMODYNAMICS:   VENTILATOR SETTINGS: Vent Mode:  [-] PRVC FiO2 (%):  [60 %-100 %] 60 % Set Rate:  [14 bmp] 14 bmp Vt Set:  [450 mL] 450 mL PEEP:  [5 cmH20] 5 cmH20 Plateau Pressure:  [7 cmH20-16 cmH20] 16 cmH20 INTAKE / OUTPUT:  Intake/Output Summary (Last 24 hours) at 10/12/2014 1154 Last data filed at 10/11/2014 1030  Gross per 24 hour  Intake    100 ml  Output      0 ml  Net    100 ml    PHYSICAL EXAMINATION: General: Severely cachectic, comatose Neuro:  Flaccid (after intubation), pupils pinpoint HEENT: temporal wasting, no evidence of trauma Cardiovascular: Reg, no M Lungs: clear anteriorly Abdomen:  Scaphoid, soft, diminished BS Musculoskeletal:  No edema, severe muscle wasting, L great toe amputated, multiple eschars on L foot, R transmetatarsal amputation   LABS:  CBC  Recent Labs Lab 10/17/14 0720 09/26/2014 0630 10/06/2014 0648  WBC 21.2* 11.9*  --   HGB 10.3* 8.8* 11.6*  HCT 33.3* 29.3* 34.0*  PLT 109* 71*  --    Coag's No results for input(s): APTT, INR in the last 168 hours. BMET  Recent Labs Lab 10/17/14 0720 10/08/2014  0630 10/13/2014 0648  NA 138 138 137  K 5.0 4.7 4.6  CL 99 96 95*  CO2 24 33*  --   BUN 39* 30* 31*  CREATININE 8.41* 5.45* 5.40*  GLUCOSE 91 160* 150*   Electrolytes  Recent Labs Lab 10/17/14 0720 10/11/2014 0630  CALCIUM 7.9* 7.4*  PHOS 4.9*  --    Sepsis Markers  Recent Labs Lab 10/19/2014 0644  LATICACIDVEN 1.86   ABG  Recent Labs Lab 09/23/2014 0656  PHART 7.497*  PCO2ART 43.8  PO2ART 487.0*   Liver Enzymes  Recent Labs Lab 10/17/14 0720 10/22/2014 0630  AST  --  31  ALT  --  <5  ALKPHOS  --  96  BILITOT  --  1.0  ALBUMIN 1.8* 1.3*   Cardiac Enzymes No results for input(s): TROPONINI, PROBNP in the last 168 hours. Glucose  Recent Labs Lab 10/03/2014 984 095 2094 10/20/2014 0643 10/14/2014 0705 10/11/2014 0817  10/10/2014 0927  GLUCAP 15* 394* 236* 275* 245*    CXR: NACPD  ASSESSMENT / PLAN:  NEUROLOGIC A:  Hypoglycemic coma-  Duration uncertain Severe adult FTT Severe muscular deconditioning Guarded prognosis P:   RASS goal: 0 Avoid all sedatives/analgesics until responsive Try to extubate if sufficiently awake to do so  PULMONARY OETT 1/30 >>  A: VDRF due to coma P:   Vent settings established Vent bundle implemented Daily SBT if/when indicated Vent support is to be short term only. Consider vent withdrawal 2/01 if not improving  CARDIOVASCULAR  A:  Hypotension Severe PVD, s/p multiple amputations P:  DNR No vasopressors IVFs only  RENAL A:   ESRD, TTS days, L groin graft P:   Monitor BMET intermittently Monitor I/Os Correct electrolytes as indicated Renal consult  GASTROINTESTINAL A:   Severe protein calorie malnutrition High risk of refeeding syndrome P:   SUP: IV famotidine Begin TFs Monitor for electrolyte abn's  HEMATOLOGIC A:   Anemia of renal disease P:  Monitor BMET intermittently Monitor I/Os Correct electrolytes as indicated  INFECTIOUS A:   No overt infection Broad spectrum abx initiated in ED P:   F/U cultures Cont Vanc/Zosyn for now  ENDOCRINE A:   Severe hypoglycemia - likely due to impaired caloric intake insetting of ESRD P:   D10W ordered Monitor CBGs No insulin unless glu > 250!!     FAMILY  Spoke in detail with sister Education officer, community) and nephew. They wish to limit our aggressiveness. I suggested we continue our support through [place] and not beyond 2/01 unless his prognosis for functional recovery improves   45 mins CCM time     Merton Border, MD ; [facility] [phone].  After 5:30 PM or weekends, call [phone] Pulmonary and [place] Pager: [phone]  10/09/2014, 11:54 AM

## 2014-10-21 NOTE — Assessment & Plan Note (Signed)
TTS dialysis;pt on phoslo and sensipar

## 2014-10-21 NOTE — Assessment & Plan Note (Signed)
Noted  

## 2014-10-21 NOTE — Assessment & Plan Note (Addendum)
D/c Hb 10.3 which appears around baseline;pt gets epogen at dialysis

## 2014-10-21 NOTE — ED Notes (Signed)
Patient arrives via EMS from [facility].  Staff reported he was unresponsive.  EMS reported the sugar to be low, initiated IO and gave 1 amp D50.  Upon arrival CBG 158, patient with respiration being assisted.

## 2014-10-22 LAB — CBC
HEMATOCRIT: 27.7 % — AB (ref 39.0–52.0)
HEMOGLOBIN: 8.4 g/dL — AB (ref 13.0–17.0)
MCH: 24.6 pg — AB (ref 26.0–34.0)
MCHC: 30.3 g/dL (ref 30.0–36.0)
MCV: 81 fL (ref 78.0–100.0)
PLATELETS: 38 10*3/uL — AB (ref 150–400)
RBC: 3.42 MIL/uL — AB (ref 4.22–5.81)
RDW: 16.3 % — ABNORMAL HIGH (ref 11.5–15.5)
WBC: 12.6 10*3/uL — AB (ref 4.0–10.5)

## 2014-10-22 LAB — BILIRUBIN, FRACTIONATED(TOT/DIR/INDIR)
BILIRUBIN TOTAL: 3.8 mg/dL — AB (ref 0.3–1.2)
Bilirubin, Direct: 2.4 mg/dL — ABNORMAL HIGH (ref 0.0–0.5)
Indirect Bilirubin: 1.4 mg/dL — ABNORMAL HIGH (ref 0.3–0.9)

## 2014-10-22 LAB — DIFFERENTIAL
Basophils Absolute: 0 10*3/uL (ref 0.0–0.1)
Basophils Relative: 0 % (ref 0–1)
Eosinophils Absolute: 0 10*3/uL (ref 0.0–0.7)
Eosinophils Relative: 0 % (ref 0–5)
Lymphocytes Relative: 3 % — ABNORMAL LOW (ref 12–46)
Lymphs Abs: 0.3 10*3/uL — ABNORMAL LOW (ref 0.7–4.0)
MONO ABS: 0.6 10*3/uL (ref 0.1–1.0)
Monocytes Relative: 5 % (ref 3–12)
NEUTROS PCT: 92 % — AB (ref 43–77)
Neutro Abs: 11.1 10*3/uL — ABNORMAL HIGH (ref 1.7–7.7)

## 2014-10-22 LAB — MAGNESIUM: MAGNESIUM: 1.7 mg/dL (ref 1.5–2.5)

## 2014-10-22 LAB — APTT: aPTT: 51 seconds — ABNORMAL HIGH (ref 24–37)

## 2014-10-22 LAB — PROTIME-INR
INR: 1.68 — AB (ref 0.00–1.49)
PROTHROMBIN TIME: 20 s — AB (ref 11.6–15.2)

## 2014-10-22 LAB — LACTATE DEHYDROGENASE: LDH: 529 U/L — ABNORMAL HIGH (ref 94–250)

## 2014-10-22 LAB — GLUCOSE, CAPILLARY
GLUCOSE-CAPILLARY: 121 mg/dL — AB (ref 70–99)
Glucose-Capillary: 125 mg/dL — ABNORMAL HIGH (ref 70–99)
Glucose-Capillary: 160 mg/dL — ABNORMAL HIGH (ref 70–99)
Glucose-Capillary: 168 mg/dL — ABNORMAL HIGH (ref 70–99)
Glucose-Capillary: 169 mg/dL — ABNORMAL HIGH (ref 70–99)
Glucose-Capillary: 97 mg/dL (ref 70–99)

## 2014-10-22 LAB — COMPREHENSIVE METABOLIC PANEL
ALBUMIN: 1.3 g/dL — AB (ref 3.5–5.2)
ALT: <5 U/L (ref 0–53)
ANION GAP: 11 (ref 5–15)
AST: 110 U/L — AB (ref 0–37)
Alkaline Phosphatase: 99 U/L (ref 39–117)
BUN: 47 mg/dL — AB (ref 6–23)
CHLORIDE: 96 mmol/L (ref 96–112)
CO2: 27 mmol/L (ref 19–32)
Calcium: 7.6 mg/dL — ABNORMAL LOW (ref 8.4–10.5)
Creatinine, Ser: 5.44 mg/dL — ABNORMAL HIGH (ref 0.50–1.35)
GFR, EST AFRICAN AMERICAN: 12 mL/min — AB (ref >90)
GFR, EST NON AFRICAN AMERICAN: 11 mL/min — AB (ref >90)
GLUCOSE: 189 mg/dL — AB (ref 70–99)
Potassium: 5.1 mmol/L (ref 3.5–5.1)
Sodium: 134 mmol/L — ABNORMAL LOW (ref 135–145)
TOTAL PROTEIN: 4.5 g/dL — AB (ref 6.0–8.3)
Total Bilirubin: 3.1 mg/dL — ABNORMAL HIGH (ref 0.3–1.2)

## 2014-10-22 LAB — FIBRINOGEN: FIBRINOGEN: 498 mg/dL — AB (ref 204–475)

## 2014-10-22 LAB — D-DIMER, QUANTITATIVE: D-Dimer, Quant: 10.81 ug/mL-FEU — ABNORMAL HIGH (ref 0.00–0.48)

## 2014-10-22 LAB — TECHNOLOGIST SMEAR REVIEW

## 2014-10-22 LAB — PHOSPHORUS: Phosphorus: 5.7 mg/dL — ABNORMAL HIGH (ref 2.3–4.6)

## 2014-10-22 NOTE — Progress Notes (Signed)
Utilization review completed.  

## 2014-10-22 NOTE — Progress Notes (Signed)
PULMONARY / CRITICAL CARE MEDICINE   Name: [name] MRN: [id number] DOB: 1960-11-29    ADMISSION DATE:  10/08/2014 CONSULTATION DATE:  10/03/2014   REFERRING MD :  EDP  CHIEF COMPLAINT:  Unresponsive   INITIAL PRESENTATION:  54 y.o male NH resident with ESRD, severe PVD, adult FTT found on AM of admission 1/30 unresponsive and transported to ED where he was found to be profoundly hypoglycemic. Intubated due to GCS 3. Made DNR after discussion with family  STUDIES:  1/30 CT head: neg 1/30 CXR: Appliances appear in satisfactory position. Emphysematous changes in the lungs with calcification in the apices, likely postinflammatory. Decrease left pleural effusion since prior study   SIGNIFICANT EVENTS: 1/30 admitted    HISTORY OF PRESENT ILLNESS:  54 y.o male NH resident with ESRD, severe PVD, adult FTT found on AM of admission 1/30 unresponsive and transported to ED where he was found to be profoundly hypoglycemic. Intubated due to GCS 3. Made DNR after discussion with family  PAST MEDICAL HISTORY :   has a past medical history of ESRD on hemodialysis; Hypertension; kidney transplant; Gout tophi; Chronic steroid use; Systolic CHF, chronic; Malnutrition; SBO (04/2012); Anemia associated with chronic renal failure; Hepatitis C; GERD (gastroesophageal reflux disease); H/O hiatal hernia; Arthritis; History of DVT (deep vein thrombosis); Peripheral vascular disease; Prostate cancer; CAD (coronary artery disease); Chronic diastolic CHF (congestive heart failure); and Dry gangrene.  has past surgical history that includes laparotomy (04/14/2012); Colon resection (04/14/2012); laparotomy (04/22/2012); Thrombectomy w/ embolectomy (04/27/2012); prostectomy (2011); renal grafts; Pelvic abcess drainage (Right, 6/14); Transanal excision of rectal mass (N/A, 03/30/2013); Aortogram (08/15/2014); Femoral-popliteal Bypass Graft (Right, 08/18/2014); shuntogram (Left, 09/29/2012); abdominal aortagram (N/A, 01/03/2014);  abdominal aortagram (Right, 01/11/2014); left heart catheterization with coronary angiogram (N/A, 01/12/2014); percutaneous coronary rotoblator intervention (pci-r) (N/A, 01/13/2014); abdominal aortagram (N/A, 07/19/2014); shuntogram (N/A, 08/24/2014); Colonoscopy (N/A, 09/11/2014); Amputation (Left, 09/12/2014); Flexible sigmoidoscopy (N/A, 09/21/2014); Colonoscopy; Eye surgery (Bilateral); and Amputation (Right, 10/13/2014). Prior to Admission medications   Medication Sig Start Date End Date Taking? Authorizing Provider  amLODipine (NORVASC) 5 MG tablet Take 2 tablets (10 mg total) by mouth daily. Patient taking differently: Take 5 mg by mouth daily.  09/05/14  Yes Shanker Kristeen Mans, MD  atorvastatin (LIPITOR) 40 MG tablet TAKE 1 TABLET BY MOUTH DAILY AT 6 PM 09/05/14  Yes Shanker Kristeen Mans, MD  calcium acetate (PHOSLO) 667 MG tablet Take 3 tablets by mouth 3 (three) times daily. 09/05/14  Yes Shanker Kristeen Mans, MD  carvedilol (COREG) 6.25 MG tablet Take 1 tablet (6.25 mg total) by mouth 2 (two) times daily with a meal. 09/05/14  Yes Shanker Kristeen Mans, MD  cinacalcet (SENSIPAR) 60 MG tablet Take 1 tablet (60 mg total) by mouth daily. 09/05/14  Yes Shanker Kristeen Mans, MD  clopidogrel (PLAVIX) 75 MG tablet Take 75 mg by mouth daily.  09/07/14  Yes Historical Provider, MD  colchicine (COLCRYS) 0.6 MG tablet Take 0.5 tablets (0.3 mg total) by mouth 2 (two) times a week. On Monday and Thursday 09/05/14  Yes Shanker Kristeen Mans, MD  diphenhydrAMINE (BENADRYL) 25 MG tablet Take 1 tablet (25 mg total) by mouth every 6 (six) hours as needed. 09/24/14  Yes Tiffany Marilu Favre, PA-C  HYDROmorphone (DILAUDID) 2 MG tablet Take 1 tablet (2 mg total) by mouth every 6 (six) hours as needed for severe pain. 10/18/14  Yes Newt Minion, MD  metoCLOPramide (REGLAN) 10 MG tablet Take 1 tablet (10 mg total) by mouth every 8 (eight)  hours as needed for nausea or vomiting. 09/05/14  Yes Shanker Kristeen Mans, MD  multivitamin (RENA-VIT)  TABS tablet Take 1 tablet by mouth daily. 09/05/14  Yes Shanker Kristeen Mans, MD  Nutritional Supplements (FEEDING SUPPLEMENT, NEPRO CARB STEADY,) LIQD Take 237 mLs by mouth as needed (missed meal during dialysis.). 09/05/14  Yes Shanker Kristeen Mans, MD  oxyCODONE (ROXICODONE) 5 MG immediate release tablet Take 1-2 tablets (5-10 mg total) by mouth every 6 (six) hours as needed for severe pain. 10/18/14  Yes Newt Minion, MD  pantoprazole (PROTONIX) 40 MG tablet Take 1 tablet (40 mg total) by mouth daily. 09/05/14  Yes Shanker Kristeen Mans, MD  predniSONE (DELTASONE) 10 MG tablet Take 2 tablets (20 mg total) by mouth daily. 09/24/14  Yes Tiffany Marilu Favre, PA-C  simethicone (MYLICON) 40 KD/3.2IZ drops Take 0.6 mLs (40 mg total) by mouth 4 (four) times daily as needed for flatulence. 09/05/14  Yes Shanker Kristeen Mans, MD  polyethylene glycol (MIRALAX / GLYCOLAX) packet Take 17 g by mouth daily. Patient not taking: Reported on 10/13/2014 09/05/14   Jonetta Osgood, MD  predniSONE (DELTASONE) 5 MG tablet Take 1 tablet (5 mg total) by mouth daily. Patient not taking: Reported on 10/22/2014 09/05/14   Jonetta Osgood, MD  senna-docusate (SENOKOT-S) 8.6-50 MG per tablet Take 1 tablet by mouth 2 (two) times daily as needed for mild constipation. Patient not taking: Reported on 10/13/2014 09/05/14   Jonetta Osgood, MD   Allergies  Allergen Reactions  . Allopurinol Other (See Comments)    REACTION: decreased platelets    FAMILY HISTORY:  indicated that his mother is deceased. He indicated that his father is deceased. He indicated that his sister is alive. He indicated that his brother is deceased.  SOCIAL HISTORY:  reports that he quit smoking about 41 years ago. His smoking use included Cigarettes. He has a 10 pack-year smoking history. He has never used smokeless tobacco. He reports that he does not drink alcohol or use illicit drugs.  REVIEW OF SYSTEMS:   Unable to obtain due to ETT  SUBJECTIVE:  Unable  to obtain due to ETT  VITAL SIGNS: Temp:  [93 F (33.9 C)-100.3 F (37.9 C)] 97.5 F (36.4 C) (01/31 0735) Pulse Rate:  [47-97] 74 (01/31 0305) Resp:  [12-27] 14 (01/31 0700) BP: (63-132)/(19-97) 114/51 mmHg (01/31 0700) SpO2:  [66 %-100 %] 100 % (01/31 0305) FiO2 (%):  [40 %-60 %] 40 % (01/31 0600) Weight:  [93 lb 7.6 oz (42.4 kg)-99 lb 13.9 oz (45.3 kg)] 99 lb 13.9 oz (45.3 kg) (01/31 0422) HEMODYNAMICS:   VENTILATOR SETTINGS: Vent Mode:  [-] PRVC FiO2 (%):  [40 %-60 %] 40 % Set Rate:  [14 bmp] 14 bmp Vt Set:  [450 mL] 450 mL PEEP:  [5 cmH20] 5 cmH20 Plateau Pressure:  [7 cmH20-16 cmH20] 14 cmH20 INTAKE / OUTPUT:  Intake/Output Summary (Last 24 hours) at 10/22/14 0737 Last data filed at 10/22/14 0600  Gross per 24 hour  Intake   1755 ml  Output      0 ml  Net   1755 ml    PHYSICAL EXAMINATION: General:  Lying in bed nad, cachetic  Neuro:  Will open eyes, not following commands HEENT:  [place]/at Cardiovascular:  RRR, no murmurs  Lungs:  ctab  Abdomen:  Soft, ntnd, wnl bs Musculoskeletal:  Cachetic, physical deconditioning  Skin:  Gangrenous right lower ext with amputation of all right toes, amputations on left toes  Ext:  1-2+ edema R>L upper extremity, 1+ edema b/l lower ext, right foot no DP, PT pulse, left foot with DP and PT pulse   LABS:  CBC  Recent Labs Lab 10/17/14 0720 10/09/2014 0630 10/22/2014 0648 10/22/14 0225  WBC 21.2* 11.9*  --  12.6*  HGB 10.3* 8.8* 11.6* 8.4*  HCT 33.3* 29.3* 34.0* 27.7*  PLT 109* 71*  --  38*   Coag's No results for input(s): APTT, INR in the last 168 hours. BMET  Recent Labs Lab 10/17/14 0720 10/08/2014 0630 09/29/2014 0648 10/22/14 0225  NA 138 138 137 134*  K 5.0 4.7 4.6 5.1  CL 99 96 95* 96  CO2 24 33*  --  27  BUN 39* 30* 31* 47*  CREATININE 8.41* 5.45* 5.40* 5.44*  GLUCOSE 91 160* 150* 189*   Electrolytes  Recent Labs Lab 10/17/14 0720 10/01/2014 0630 10/22/14 0225  CALCIUM 7.9* 7.4* 7.6*  MG  --   --  1.7   PHOS 4.9*  --  5.7*   Sepsis Markers  Recent Labs Lab 09/30/2014 0644  LATICACIDVEN 1.86   ABG  Recent Labs Lab 09/24/2014 0656  PHART 7.497*  PCO2ART 43.8  PO2ART 487.0*   Liver Enzymes  Recent Labs Lab 10/17/14 0720 10/08/2014 0630 10/22/14 0225  AST  --  31 110*  ALT  --  <5 <5  ALKPHOS  --  96 99  BILITOT  --  1.0 3.1*  ALBUMIN 1.8* 1.3* 1.3*   Cardiac Enzymes No results for input(s): TROPONINI, PROBNP in the last 168 hours. Glucose  Recent Labs Lab 10/16/2014 1328 09/27/2014 1446 10/06/2014 1522 09/25/2014 2013 10/22/14 0026 10/22/14 0437  GLUCAP 226* 215* 209* 185* 168* 169*    Imaging Ct Head Wo Contrast  10/11/2014   CLINICAL DATA:  unresponsive  .  Hypoglycemic coma  EXAM: CT HEAD WITHOUT CONTRAST  TECHNIQUE: Contiguous axial images were obtained from the base of the skull through the vertex without intravenous contrast.  COMPARISON:  None  available  FINDINGS: Atherosclerotic and physiologic intracranial calcifications. Scattered tentorial and falcine calcifications. Mild atrophy. There is no evidence of acute intracranial hemorrhage, brain edema, mass lesion, acute infarction, mass effect, or midline shift. Acute infarct may be inapparent on noncontrast CT. No other intra-axial abnormalities are seen, and the ventricles and sulci are within normal limits in size and symmetry. No abnormal extra-axial fluid collections or masses are identified. No significant calvarial abnormality.  IMPRESSION: 1. Negative for bleed or other acute intracranial process.   Electronically Signed   By: Arne Cleveland M.D.   On: 10/10/2014 09:24   Dg Chest Port 1 View  10/17/2014   CLINICAL DATA:  Intubation and OG tube placement.  EXAM: PORTABLE CHEST - 1 VIEW  COMPARISON:  09/24/2014  FINDINGS: Interval placement of an endotracheal tube with tip measuring 5.9 cm above the carinal. Enteric tube was placed with tip off the field of view but below the left hemidiaphragm. Normal heart size  and pulmonary vascularity. Left pleural effusion has decreased since previous study. Calcification in the lung apices, likely postinflammatory. Pulmonary hyperinflation likely due to emphysema. Vascular calcifications in the upper abdomen.  IMPRESSION: Appliances appear in satisfactory position. Emphysematous changes in the lungs with calcification in the apices, likely postinflammatory. Decrease left pleural effusion since prior study   Electronically Signed   By: Lucienne Capers M.D.   On: 09/28/2014 07:00     ASSESSMENT / PLAN: NEUROLOGIC A:   Hypoglycemic coma-duration uncertain prognosis guarded  Acute  encephalopathy  Severe muscular deconditioning P:   RASS goal: 0  Still not following commands  Fentanyl prn-not used    PULMONARY OETT 1/30 A: VDRF 2/2 coma  P:   Full vent support. +/- SBT, Will try to wean today  DNR considering w/d of care and terminal extubatation 2/1   CARDIOVASCULAR CVL none A:  Hypotension BP improved   H/o severe PVD s/p multiple amputations H/o CAD H/o HTN H/o chronic combined CHF P:  Monitor VS  No pressors, D10 at 25 cc/hr will d/c today, prn boluses  Solumedrol 30 q12   RENAL A:   Hyponatremia 134  Hyperphos 5.7  ESRD with fistula graft left thigh TTS H/o Kidney transplant    P:   Holding HD due to hypotension and no indication as of 1/30-renal following  Strict in/outs, daily wts  Renal function panel am  GASTROINTESTINAL A:   H/o short bowel syndrome  H/o Hep C  Severe Malnutrition with FTT  H/o GERD, hiatal hernia  Risk of refeeding syndrome  Nutrition via TF residuals of 80-90 TF rate at 20 cc/hr  P:   NPO with TF Pepcid 20 mg iv   HEMATOLOGIC A:   Chronic anemia Thrombocytopenia downtrending ? Etiology could be 2/2 critical illness ? HIT (previously received Heparin on 12/3 though) vs DIC  H/o DVT  H/o prostate cancer   P:  scds d/c due to PVD and fistula in left leg Trend CBC, will check PT/INR, PTT,  differential and smear, fibrinogen, d dimer, ldh, haptoglobin to w/u for DIC  INFECTIOUS A:   SIRS ?sepsis  Leukocytosis may be related to steroids vs gangrenous leg  H/o Hep C  P:   BCx2 1/30>> UC none Sputum none MRSA 1/30>> Abx: Vanc 1/30>>, Zosyn 1/30>>  F/u cultures, on ABX  ENDOCRINE A:   Hypoglycemia improved on D10 now Hyperglycemia  Hyperparathyroidism  P:   D10 consider d/cing for now Monitor cbgs  Hold SSI for now    FAMILY  - Updates: updated 1/20   - Inter-disciplinary family meet or Palliative Care meeting due by: w/d of care 2/1      Pulmonary and [place] Pager: [phone]  10/22/2014, 7:37 AM Karlyn Agee MD   PCCM ATTENDING: I have reviewed pt's initial presentation, consultants notes and hospital database in detail.  The above assessment and plan was formulated under my direction.    Merton Border, MD;  PCCM service; Mobile [phone]

## 2014-10-22 NOTE — Progress Notes (Signed)
Subjective: Interval History: has no complaint, opens eyes, but no purposeful interactions  Objective: Vital signs in last 24 hours: Temp:  [93 F (33.9 C)-100.3 F (37.9 C)] 97.5 F (36.4 C) (01/31 0735) Pulse Rate:  [47-97] 69 (01/31 0810) Resp:  [12-27] 21 (01/31 0810) BP: (63-132)/(19-97) 114/47 mmHg (01/31 0810) SpO2:  [66 %-100 %] 100 % (01/31 0810) FiO2 (%):  [40 %-60 %] 40 % (01/31 0810) Weight:  [42.4 kg (93 lb 7.6 oz)-45.3 kg (99 lb 13.9 oz)] 45.3 kg (99 lb 13.9 oz) (01/31 0422) Weight change: 1.576 kg (3 lb 7.6 oz)  Intake/Output from previous day: 01/30 0701 - 01/31 0700 In: 0174 [I.V.:825; NG/GT:580; IV Piggyback:350] Out: 0  Intake/Output this shift:    General appearance: cachectic, pale and uncooperative Resp: rhonchi bilaterally Cardio: S1, S2 normal and systolic murmur: holosystolic 2/6, blowing at apex GI: pos bs, multiple scars Extremities: AVG L groin B&T,  RTMA, L 1st ray amp, eschars, scaling  Lab Results:  Recent Labs  09/29/2014 0630 09/28/2014 0648 10/22/14 0225  WBC 11.9*  --  12.6*  HGB 8.8* 11.6* 8.4*  HCT 29.3* 34.0* 27.7*  PLT 71*  --  38*   BMET:  Recent Labs  10/17/2014 0630 10/17/2014 0648 10/22/14 0225  NA 138 137 134*  K 4.7 4.6 5.1  CL 96 95* 96  CO2 33*  --  27  GLUCOSE 160* 150* 189*  BUN 30* 31* 47*  CREATININE 5.45* 5.40* 5.44*  CALCIUM 7.4*  --  7.6*   No results for input(s): PTH in the last 72 hours. Iron Studies: No results for input(s): IRON, TIBC, TRANSFERRIN, FERRITIN in the last 72 hours.  Studies/Results: Ct Head Wo Contrast  10/10/2014   CLINICAL DATA:  unresponsive  .  Hypoglycemic coma  EXAM: CT HEAD WITHOUT CONTRAST  TECHNIQUE: Contiguous axial images were obtained from the base of the skull through the vertex without intravenous contrast.  COMPARISON:  None  available  FINDINGS: Atherosclerotic and physiologic intracranial calcifications. Scattered tentorial and falcine calcifications. Mild atrophy. There is  no evidence of acute intracranial hemorrhage, brain edema, mass lesion, acute infarction, mass effect, or midline shift. Acute infarct may be inapparent on noncontrast CT. No other intra-axial abnormalities are seen, and the ventricles and sulci are within normal limits in size and symmetry. No abnormal extra-axial fluid collections or masses are identified. No significant calvarial abnormality.  IMPRESSION: 1. Negative for bleed or other acute intracranial process.   Electronically Signed   By: Arne Cleveland M.D.   On: 10/17/2014 09:24   Dg Chest Port 1 View  10/03/2014   CLINICAL DATA:  Intubation and OG tube placement.  EXAM: PORTABLE CHEST - 1 VIEW  COMPARISON:  09/24/2014  FINDINGS: Interval placement of an endotracheal tube with tip measuring 5.9 cm above the carinal. Enteric tube was placed with tip off the field of view but below the left hemidiaphragm. Normal heart size and pulmonary vascularity. Left pleural effusion has decreased since previous study. Calcification in the lung apices, likely postinflammatory. Pulmonary hyperinflation likely due to emphysema. Vascular calcifications in the upper abdomen.  IMPRESSION: Appliances appear in satisfactory position. Emphysematous changes in the lungs with calcification in the apices, likely postinflammatory. Decrease left pleural effusion since prior study   Electronically Signed   By: Lucienne Capers M.D.   On: 10/14/2014 07:00    I have reviewed the patient's current medications.  Assessment/Plan: 1 ESRD stable have not dialyzed considering situation and pending decision re withdrawal.  2 anemia 3 PVD 4 Severe malnutrition 5 Short bowel 6 Hep C 7 Prostate Ca 8 HPTH P conservative care unless dramatic improvement and withdraw if not improving    LOS: 1 day   Bakary Bramer L 10/22/2014,8:21 AM

## 2014-10-23 LAB — GLUCOSE, CAPILLARY
GLUCOSE-CAPILLARY: 64 mg/dL — AB (ref 70–99)
Glucose-Capillary: 74 mg/dL (ref 70–99)
Glucose-Capillary: 76 mg/dL (ref 70–99)
Glucose-Capillary: 91 mg/dL (ref 70–99)
Glucose-Capillary: 82 mg/dL (ref 70–99)

## 2014-10-23 LAB — HAPTOGLOBIN

## 2014-10-23 MED ORDER — MORPHINE BOLUS VIA INFUSION
5.0000 mg | INTRAVENOUS | Status: DC | PRN
  Filled 2014-10-23: qty 20

## 2014-10-23 MED ORDER — DEXTROSE 50 % IV SOLN
25.0000 mL | Freq: Once | INTRAVENOUS | Status: AC
  Administered 2014-10-23: 25 mL via INTRAVENOUS

## 2014-10-23 MED ORDER — MORPHINE SULFATE 25 MG/ML IV SOLN
10.0000 mg/h | INTRAVENOUS | Status: DC
  Administered 2014-10-23: 10 mg/h via INTRAVENOUS
  Filled 2014-10-23 (×2): qty 10

## 2014-10-23 MED ORDER — DEXTROSE 50 % IV SOLN
INTRAVENOUS | Status: AC
  Filled 2014-10-23: qty 50

## 2014-10-23 NOTE — Progress Notes (Signed)
Pt 8AM blood sugar was 64. Started hypoglycemia protocol.

## 2014-10-23 NOTE — Progress Notes (Signed)
PCCM Interval Note  I met with the pt's sister (his closest family) today and reviewed his mental status, prognosis for a meaningful recovery. Given this information she tells me that the patient would not want to undergo prolonged ventilation. We have agreed that most appropriate course is withdrawal of life support. I explained that he may live for hours to days.   Baltazar Apo, MD, PhD 10/23/2014, 1:52 PM [facility] [phone] or if no answer [phone]

## 2014-10-23 NOTE — Progress Notes (Addendum)
Pt's Rectal temp 94.1, bare hugger applied to pt.   Babs Bertin RN

## 2014-10-23 NOTE — Procedures (Signed)
Extubation Procedure Note  Patient Details:   Name: [name] DOB: 12/29/1960 MRN: [id number]   Airway Documentation:  Airway 8 mm (Active)  Secured at (cm) 25 cm 10/23/2014 11:57 AM  Measured From Lips 10/23/2014 12:30 PM  [place] 10/23/2014 12:30 PM  Secured By [employer] 10/23/2014 12:30 PM  Tube Holder Repositioned Yes 10/23/2014 11:57 AM  Cuff Pressure (cm H2O) 25 cm H2O 10/22/2014  7:17 PM  Site Condition Dry 10/22/2014 12:39 PM    Evaluation  O2 sats: transiently fell during during procedure Complications: Complications of sats dropping Patient did not tolerate procedure well. Bilateral Breath Sounds: Clear, Diminished Suctioning: Airway, Oral No  Terminal withdrawal of life  Durinda Buzzelli, Leonie Douglas 10/23/2014, 2:55 PM

## 2014-10-23 NOTE — Progress Notes (Signed)
Assessment/Plan: 1 ESRD   2 anemia 3 PVD 4 Severe malnutrition 5 Short bowel 6 Hep C 7 Prostate Ca 8 HPTH P Will plan dialysis today given apparent improvement  Subjective: Interval History: See exam  Objective: Vital signs in last 24 hours: Temp:  [94.1 F (34.5 C)-97.8 F (36.6 C)] 97.8 F (36.6 C) (02/01 0420) Pulse Rate:  [58-81] 70 (02/01 0800) Resp:  [13-22] 22 (02/01 0800) BP: (61-171)/(13-106) 99/76 mmHg (02/01 0800) SpO2:  [99 %-100 %] 100 % (02/01 0800) FiO2 (%):  [40 %] 40 % (02/01 0730) Weight:  [42.4 kg (93 lb 7.6 oz)] 42.4 kg (93 lb 7.6 oz) (02/01 0300) Weight change: 0 kg (0 lb)  Intake/Output from previous day: 01/31 0701 - 02/01 0700 In: 407 [I.V.:120; NG/GT:137; IV Piggyback:150] Out: 0  Intake/Output this shift:    General appearance: opens eyes and tracked me Eyes: as above Resp: clear to auscultation bilaterally Extremities: edema no edema, flexion contractures and muscle atrophy;  right groin AVG  Lab Results:  Recent Labs  10/14/2014 0630 10/08/2014 0648 10/22/14 0225  WBC 11.9*  --  12.6*  HGB 8.8* 11.6* 8.4*  HCT 29.3* 34.0* 27.7*  PLT 71*  --  38*   BMET:  Recent Labs  10/19/2014 0630 10/14/2014 0648 10/22/14 0225  NA 138 137 134*  K 4.7 4.6 5.1  CL 96 95* 96  CO2 33*  --  27  GLUCOSE 160* 150* 189*  BUN 30* 31* 47*  CREATININE 5.45* 5.40* 5.44*  CALCIUM 7.4*  --  7.6*   No results for input(s): PTH in the last 72 hours. Iron Studies: No results for input(s): IRON, TIBC, TRANSFERRIN, FERRITIN in the last 72 hours. Studies/Results: No results found.  Scheduled: . antiseptic oral rinse  7 mL Mouth Rinse QID  . chlorhexidine  15 mL Mouth Rinse BID  . famotidine (PEPCID) IV  20 mg Intravenous Q24H  . methylPREDNISolone (SOLU-MEDROL) injection  30 mg Intravenous Q12H  . piperacillin-tazobactam (ZOSYN)  IV  2.25 g Intravenous 3 times per day     LOS: 2 days   Constantin Hillery C 10/23/2014,10:16 AM

## 2014-10-23 NOTE — Progress Notes (Signed)
PULMONARY / CRITICAL CARE MEDICINE   Name: [name] MRN: [id number] DOB: 12/18/1960    ADMISSION DATE:  10/06/2014 CONSULTATION DATE:  09/30/2014   REFERRING MD :  EDP  CHIEF COMPLAINT:  Unresponsive   INITIAL PRESENTATION:  54 y.o male NH resident with ESRD, severe PVD, adult FTT found on AM of admission 1/30 unresponsive and transported to ED where he was found to be profoundly hypoglycemic. Intubated due to GCS 3. Made DNR after discussion with family  STUDIES:  1/30 CT head: neg 1/30 CXR: Appliances appear in satisfactory position. Emphysematous changes in the lungs with calcification in the apices, likely postinflammatory. Decrease left pleural effusion since prior study   SIGNIFICANT EVENTS: 1/30 admitted    SUBJECTIVE:  Unable to obtain due to ETT  VITAL SIGNS: Temp:  [94.1 F (34.5 C)-97.8 F (36.6 C)] 97.4 F (36.3 C) (02/01 1134) Pulse Rate:  [58-81] 75 (02/01 1157) Resp:  [9-22] 12 (02/01 1157) BP: (61-201)/(16-106) 201/93 mmHg (02/01 1100) SpO2:  [99 %-100 %] 100 % (02/01 1157) FiO2 (%):  [40 %] 40 % (02/01 1157) Weight:  [42.4 kg (93 lb 7.6 oz)] 42.4 kg (93 lb 7.6 oz) (02/01 0300) HEMODYNAMICS:   VENTILATOR SETTINGS: Vent Mode:  [-] PRVC FiO2 (%):  [40 %] 40 % Set Rate:  [14 bmp] 14 bmp Vt Set:  [450 mL] 450 mL PEEP:  [5 cmH20] 5 cmH20 Pressure Support:  [8 cmH20] 8 cmH20 Plateau Pressure:  [13 cmH20-16 cmH20] 14 cmH20 INTAKE / OUTPUT:  Intake/Output Summary (Last 24 hours) at 10/23/14 1212 Last data filed at 10/23/14 0700  Gross per 24 hour  Intake    257 ml  Output      0 ml  Net    257 ml    PHYSICAL EXAMINATION: General:  Lying in bed nad, cachetic  Neuro:  Will open eyes to voice, not following commands HEENT:  [place]/at Cardiovascular:  RRR, no murmurs  Lungs:  ctab  Abdomen:  Soft, ntnd, wnl bs Musculoskeletal:  Cachetic, physical deconditioning  Skin:  Gangrenous right lower ext with amputation of all right toes, eschar and  amputations on left toes  Ext: 1-2+ edema R>L upper extremity, 1+ edema b/l lower ext, right foot no DP, PT pulse, left foot with DP and PT pulse   LABS:  CBC  Recent Labs Lab 10/17/14 0720 10/15/2014 0630 10/19/2014 0648 10/22/14 0225  WBC 21.2* 11.9*  --  12.6*  HGB 10.3* 8.8* 11.6* 8.4*  HCT 33.3* 29.3* 34.0* 27.7*  PLT 109* 71*  --  38*   Coag's  Recent Labs Lab 10/22/14 1105  APTT 51*  INR 1.68*   BMET  Recent Labs Lab 10/17/14 0720 10/10/2014 0630 10/12/2014 0648 10/22/14 0225  NA 138 138 137 134*  K 5.0 4.7 4.6 5.1  CL 99 96 95* 96  CO2 24 33*  --  27  BUN 39* 30* 31* 47*  CREATININE 8.41* 5.45* 5.40* 5.44*  GLUCOSE 91 160* 150* 189*   Electrolytes  Recent Labs Lab 10/17/14 0720 10/02/2014 0630 10/22/14 0225  CALCIUM 7.9* 7.4* 7.6*  MG  --   --  1.7  PHOS 4.9*  --  5.7*   Sepsis Markers  Recent Labs Lab 09/24/2014 0644  LATICACIDVEN 1.86   ABG  Recent Labs Lab 09/26/2014 0656  PHART 7.497*  PCO2ART 43.8  PO2ART 487.0*   Liver Enzymes  Recent Labs Lab 10/17/14 0720 10/19/2014 0630 10/22/14 0225 10/22/14 1105  AST  --  31 110*  --   ALT  --  <5 <5  --   ALKPHOS  --  96 99  --   BILITOT  --  1.0 3.1* 3.8*  ALBUMIN 1.8* 1.3* 1.3*  --    Cardiac Enzymes No results for input(s): TROPONINI, PROBNP in the last 168 hours. Glucose  Recent Labs Lab 10/22/14 1524 10/22/14 2000 10/22/14 2333 10/23/14 0418 10/23/14 0757 10/23/14 0949  GLUCAP 121* 97 74 76 64* 91    Imaging No results found.   ASSESSMENT / PLAN: NEUROLOGIC A:   Hypoglycemic coma-duration uncertain prognosis guarded  Acute encephalopathy  Severe muscular deconditioning P:   RASS goal: 0  Fentanyl prn-not requiring No appreciable neuro improvement, prognosis for recovery is poor. Will need to discuss with his family, consider our goals for his care  PULMONARY OETT 1/30 A: VDRF 2/2 coma  P:   Full vent support. +/- SBT if able to tolerate  DNR considering  w/d of care and terminal extubatation 2/1   CARDIOVASCULAR CVL none A:  Hypotension, BP improved   H/o severe PVD s/p multiple amputations H/o CAD H/o HTN H/o chronic combined CHF P:  Monitor VS  No pressors, IVF boluses to support BP Solumedrol 30 q12   RENAL A:   Hyponatremia 134  Hyperphos 5.7  ESRD with fistula graft left thigh TTS H/o Kidney transplant    P:   Holding HD due to hypotension and no indication as of 1/30-renal following  Strict in/outs, daily wts  Renal function panel am  GASTROINTESTINAL A:   H/o short bowel syndrome  H/o Hep C  Severe Malnutrition with FTT  H/o GERD, hiatal hernia  Risk of refeeding syndrome  Nutrition via TF residuals of 80-90 TF rate at 20 cc/hr  P:   NPO with TF Pepcid 20 mg iv   HEMATOLOGIC A:   Chronic anemia Thrombocytopenia downtrending ? Etiology could be 2/2 critical illness ? HIT (previously received Heparin on 12/3 though) vs DIC  H/o DVT  H/o prostate cancer   P:  scds d/c due to PVD and fistula in left leg Trend CBC, will check PT/INR, PTT Fibrinogen normal, haptoglobin pending   INFECTIOUS A:   SIRS ?sepsis  Leukocytosis may be related to steroids vs gangrenous leg  H/o Hep C  P:   BCx2 1/30>> UC none Sputum none MRSA 1/30>> Abx: Vanc 1/30>>, Zosyn 1/30>>  F/u cultures, on ABX  ENDOCRINE A:   Hypoglycemia improved on D10 now Hyperglycemia  Hyperparathyroidism  P:   Monitor cbgs  Hold SSI for now    FAMILY  - Updates: updated 1/20   - Inter-disciplinary family meet or Palliative Care meeting due by: w/d of care 2/1    35 minutes CC time  Baltazar Apo, MD, PhD 10/23/2014, 12:19 PM [facility] [phone] or if no answer [phone]

## 2014-10-23 DEATH — deceased

## 2014-10-24 MED ORDER — LIDOCAINE-PRILOCAINE 2.5-2.5 % EX CREA
1.0000 "application " | TOPICAL_CREAM | CUTANEOUS | Status: DC | PRN
  Filled 2014-10-24: qty 5

## 2014-10-24 MED ORDER — ALTEPLASE 2 MG IJ SOLR
2.0000 mg | Freq: Once | INTRAMUSCULAR | Status: AC | PRN
  Filled 2014-10-24: qty 2

## 2014-10-24 MED ORDER — NEPRO/CARBSTEADY PO LIQD
237.0000 mL | ORAL | Status: DC | PRN
  Filled 2014-10-24: qty 237

## 2014-10-24 MED ORDER — LIDOCAINE HCL (PF) 1 % IJ SOLN: 5.0000 mL | INTRAMUSCULAR | Status: DC | PRN

## 2014-10-24 MED ORDER — HEPARIN SODIUM (PORCINE) 1000 UNIT/ML DIALYSIS
1000.0000 [IU] | INTRAMUSCULAR | Status: DC | PRN
  Filled 2014-10-24: qty 1

## 2014-10-24 MED ORDER — HEPARIN SODIUM (PORCINE) 1000 UNIT/ML DIALYSIS
20.0000 [IU]/kg | INTRAMUSCULAR | Status: DC | PRN
  Filled 2014-10-24: qty 1

## 2014-10-24 MED ORDER — SODIUM CHLORIDE 0.9 % IV SOLN: 100.0000 mL | INTRAVENOUS | Status: DC | PRN

## 2014-10-24 MED ORDER — PENTAFLUOROPROP-TETRAFLUOROETH EX AERO: 1.0000 "application " | INHALATION_SPRAY | CUTANEOUS | Status: DC | PRN

## 2014-10-24 NOTE — Progress Notes (Signed)
PULMONARY / CRITICAL CARE MEDICINE   Name: [name] MRN: [id number] DOB: 12-18-1960    ADMISSION DATE:  10/18/2014 CONSULTATION DATE:  09/26/2014   REFERRING MD :  EDP  CHIEF COMPLAINT:  Unresponsive   INITIAL PRESENTATION:  54 y.o male NH resident with ESRD, severe PVD, adult FTT found on AM of admission 1/30 unresponsive and transported to ED where he was found to be profoundly hypoglycemic. Intubated due to GCS 3. Made DNR after discussion with family  STUDIES:  1/30 CT head: neg 1/30 CXR: Appliances appear in satisfactory position. Emphysematous changes in the lungs with calcification in the apices, likely postinflammatory. Decrease left pleural effusion since prior study   SIGNIFICANT EVENTS: 1/30 admitted    SUBJECTIVE:  Unable to obtain due to ETT  VITAL SIGNS: Temp:  [97.4 F (36.3 C)] 97.4 F (36.3 C) (02/01 1134) Pulse Rate:  [66-75] 73 (02/02 0900) Resp:  [9-32] 10 (02/02 0900) BP: (86-201)/(37-93) 120/77 mmHg (02/01 1500) SpO2:  [95 %-100 %] 97 % (02/02 0900) FiO2 (%):  [40 %] 40 % (02/01 1157) HEMODYNAMICS:   VENTILATOR SETTINGS: Vent Mode:  [-] PRVC FiO2 (%):  [40 %] 40 % Set Rate:  [14 bmp] 14 bmp Vt Set:  [450 mL] 450 mL PEEP:  [5 cmH20] 5 cmH20 Plateau Pressure:  [14 cmH20] 14 cmH20 INTAKE / OUTPUT:  Intake/Output Summary (Last 24 hours) at 10/24/14 1601 Last data filed at 10/23/14 1400  Gross per 24 hour  Intake     50 ml  Output      0 ml  Net     50 ml    PHYSICAL EXAMINATION: General:  Lying in bed nad, cachetic  Neuro:  Will open eyes to voice, not following commands HEENT:  [place]/at Cardiovascular:  RRR, no murmurs  Lungs:  ctab  Abdomen:  Soft, ntnd, wnl bs Musculoskeletal:  Cachetic, physical deconditioning  Skin:  Gangrenous right lower ext with amputation of all right toes, eschar and amputations on left toes  Ext: 1-2+ edema R>L upper extremity, 1+ edema b/l lower ext, right foot no DP, PT pulse, left foot with DP and PT pulse    LABS:  CBC  Recent Labs Lab 10/11/2014 0630 10/10/2014 0648 10/22/14 0225  WBC 11.9*  --  12.6*  HGB 8.8* 11.6* 8.4*  HCT 29.3* 34.0* 27.7*  PLT 71*  --  38*   Coag's  Recent Labs Lab 10/22/14 1105  APTT 51*  INR 1.68*   BMET  Recent Labs Lab 09/26/2014 0630 09/25/2014 0648 10/22/14 0225  NA 138 137 134*  K 4.7 4.6 5.1  CL 96 95* 96  CO2 33*  --  27  BUN 30* 31* 47*  CREATININE 5.45* 5.40* 5.44*  GLUCOSE 160* 150* 189*   Electrolytes  Recent Labs Lab 09/29/2014 0630 10/22/14 0225  CALCIUM 7.4* 7.6*  MG  --  1.7  PHOS  --  5.7*   Sepsis Markers  Recent Labs Lab 10/19/2014 0644  LATICACIDVEN 1.86   ABG  Recent Labs Lab 10/08/2014 0656  PHART 7.497*  PCO2ART 43.8  PO2ART 487.0*   Liver Enzymes  Recent Labs Lab 10/19/2014 0630 10/22/14 0225 10/22/14 1105  AST 31 110*  --   ALT <5 <5  --   ALKPHOS 96 99  --   BILITOT 1.0 3.1* 3.8*  ALBUMIN 1.3* 1.3*  --    Cardiac Enzymes No results for input(s): TROPONINI, PROBNP in the last 168 hours. Glucose  Recent Labs Lab 10/22/14  2000 10/22/14 2333 10/23/14 0418 10/23/14 0757 10/23/14 0949 10/23/14 1132  GLUCAP 97 74 76 64* 91 82    Imaging No results found.   ASSESSMENT / PLAN: Encephalopathy due to hypoglycemic injury, VDRF now extubated for comfort  Plan :  Transfer to Palliative care bed Spoke with sister at bedside 2/2   Baltazar Apo, MD, PhD 10/24/2014, 9:23 AM [facility] [phone] or if no answer [phone]

## 2014-10-24 NOTE — Progress Notes (Signed)
Nutrition Brief Note  Chart reviewed. Patient now transitioning to comfort care.  No nutrition interventions warranted at this time.  Please consult as needed.   Meziah Blasingame, RD, LDN, CNSC Pager [phone] After Hours Pager [phone]    

## 2014-10-24 NOTE — Progress Notes (Signed)
Pt. On comfort care, open eyes to voice at times, does not follow command, on morphine drip, repositioned and appears comfortable.

## 2014-10-27 LAB — CULTURE, BLOOD (ROUTINE X 2)
CULTURE: NO GROWTH
Culture: NO GROWTH

## 2014-11-02 ENCOUNTER — Inpatient Hospital Stay (HOSPITAL_COMMUNITY): Admission: RE | Admit: 2014-11-02 | Payer: Medicare Other | Source: Ambulatory Visit

## 2014-11-02 ENCOUNTER — Other Ambulatory Visit (HOSPITAL_COMMUNITY): Payer: Medicare Other

## 2014-11-02 ENCOUNTER — Ambulatory Visit: Payer: Medicare Other | Admitting: Family

## 2014-11-13 ENCOUNTER — Ambulatory Visit: Payer: Medicare Other | Admitting: Family

## 2014-11-13 ENCOUNTER — Encounter (HOSPITAL_COMMUNITY): Payer: Medicare Other

## 2014-11-13 ENCOUNTER — Other Ambulatory Visit (HOSPITAL_COMMUNITY): Payer: Medicare Other

## 2014-11-21 NOTE — Progress Notes (Signed)
Upon round, patient noted resting comfortably, attempting to make eye contact. Comfort measure provided with continuous morphine gtts. Will continue to monitor.

## 2014-11-21 NOTE — Progress Notes (Signed)
Morphine gtts discontinued, Wasted 100cc with Blanch Media, Assistance director./ Esperanza Richters RN

## 2014-11-21 NOTE — Progress Notes (Signed)
Md made aware of the death @1025 . Sister Sharyn Lull notified as well.

## 2014-11-21 NOTE — Plan of Care (Signed)
Problem: Phase I Progression Outcomes Goal: Other Phase I Outcomes/Goals Outcome: Progressing Comfort care measures maintained this shift.  Morphine drip continues at 10mg /hr.  Will continue to monitor.

## 2014-11-21 NOTE — Progress Notes (Signed)
Patient expired at 1025. Dr. Radene Gunning aware. Family contacted by charge RN. CDS contacted. Postmortem care provided.   Ave Filter, RN

## 2014-11-21 DEATH — deceased

## 2014-12-11 NOTE — Discharge Summary (Signed)
PULMONARY / CRITICAL CARE MEDICINE DEATH SUMMARY   Name: [name] MRN: [id number] DOB: [birth year]    ADMISSION DATE:  2014/11/05 DATE OF DEATH: 11-09-2014  CHIEF COMPLAINT:  Unresponsive   FINAL CAUSE OF DEATH HYPOGLYCEMIC ENCEPHALOPATHY AND COMA  SECONDARY CAUSES OF DEATH Acute respiratory failure due to obtundation Severe hypoglycemia Hypovolemic shock, improved with volume resuscitation Possible septic shock, source unclear Lower extremity cellulitis End-stage renal disease Hyponatremia Hyperphosphatemia Hyperkalemia Secondary hyperparathyroidism Anemia of chronic disease Acute on chronic thrombocytopenia Protein calorie malnutrition Short gut syndrome Adult failure to thrive History of failed renal transplant History of hepatitis C History of severe peripheral vascular disease status post multiple dictations History of coronary artery disease History of hypertension History of chronic combined systolic and diastolic heart failure without acute exacerbation   BRIEF HOSPITAL COURSE:  54 y.o male NH resident with ESRD, severe PVD, adult FTT found on AM of admission 11-05-22 unresponsive and transported to ED where he was found to be profoundly hypoglycemic. Intubated due to GCS 3. Made DNR Nov 05, 2022 after discussion with family. Admitted to ICU for further care. He was treated with empiric antibiotics and IV fluids. He required supplemental dextrose for continued hypoglycemia. His mental status did not improve even though he did not receive any sedating medications. It  was felt that he had sustained a severe hypoglycemic injury with poor prognosis. This was discussed with the patient's family 10/23/14 and the decision was made to withdraw life support per the patient's wishes. This was done on 10/23/14 and the patient expired on 2014-11-09.   STUDIES:  11-05-2022 CT head: neg November 05, 2022 CXR: Appliances appear in satisfactory position. Emphysematous changes in the lungs with calcification in the  apices, likely postinflammatory. Decrease left pleural effusion since prior study   PHYSICAL EXAMINATION 10/24/14: General:  Lying in bed nad, cachetic  Neuro:  Will open eyes to voice, not following commands HEENT:  [place]/at Cardiovascular:  RRR, no murmurs  Lungs:  ctab  Abdomen:  Soft, ntnd, wnl bs Musculoskeletal:  Cachetic, physical deconditioning  Skin:  Gangrenous right lower ext with amputation of all right toes, eschar and amputations on left toes  Ext: 1-2+ edema R>L upper extremity, 1+ edema b/l lower ext, right foot no DP, PT pulse, left foot with DP and PT pulse     Baltazar Apo, MD, PhD 12/11/2014, 4:59 PM  [facility] [phone] or if no answer [phone]

## 2015-01-14 NOTE — Op Note (Signed)
PATIENT NAME:  [name], [name] MR#:  [id number] DATE OF BIRTH:  1961/01/23  DATE OF PROCEDURE:  02/18/2012  PREOPERATIVE DIAGNOSES:  1. Thrombosis of left thigh arteriovenous graft.  2. Complication AV dialysis device.  3. Superior vena cava syndrome.    4. Endstage renal disease requiring hemodialysis.   POSTOPERATIVE DIAGNOSES:  1. Thrombosis of left thigh arteriovenous graft.  2. Complication AV dialysis device.  3. Superior vena cava syndrome.    4. Endstage renal disease requiring hemodialysis.   PROCEDURE PERFORMED:  1. Contrast injection left thigh AV graft.  2. Mechanical thrombectomy left thigh AV graft.  3. Percutaneous transluminal angioplasty to 8 mm, venous anastomosis left thigh AV graft.  4. Percutaneous transluminal angioplasty to 5 mm, left thigh AV graft arterial anastomosis.   SURGEON: Katha Cabal, M.D.   SEDATION: Versed 4 mg plus fentanyl 150 mcg administered IV. Continuous ECG, pulse oximetry, and cardiopulmonary monitoring were performed throughout the entire procedure by the interventional radiology nurse. Total sedation time was 45 minutes.   ACCESS:  1. 6 French sheath, antegrade direction, left thigh AV graft.  2. 6 French sheath, retrograde direction, left thigh AV graft.   CONTRAST USED: Isovue 32 mL.   FLUOROSCOPY TIME: 2.9 minutes.   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 He is therefore undergoing thrombectomy with correction of underlying cause. The risks and benefits were reviewed. All questions answered. The patient agrees to proceed.   DESCRIPTION OF PROCEDURE: The patient is taken to the Special Procedures suite and placed in the supine position. After adequate sedation is achieved, his left thigh is prepped and draped in sterile fashion. 1% lidocaine is infiltrated in the soft tissues overlying the graft both on the lateral limb as well as the  medial limb. In an antegrade direction working from the lateral limb, a micropuncture needle is used to access the graft, microwire followed by micro sheath, J-wire followed by a 6 [place] sheath. Hand injection of contrast confirms thrombus. 4000 units of heparin are given. Trerotola device is opened onto the field. Multiple passes with the Trerotola are then used to free the venous portion of the graft thrombus. Follow-up contrast injection demonstrates minimal residual thrombus.   6 French sheath is then inserted in a similar fashion as described above on the medial limb in a retrograde direction and a Trerotola device is introduced into the graft and used to perform thrombectomy of the arterial portion of the graft. Hand injection of contrast now shows there is flow through the graft and a high-grade stricture of approximately 70 to 75% at the venous anastomosis. It does appear to be focal.   A Magic torque wire is then advanced through this lesion into the venous system and an 8 x 4 conquest balloon is advanced across the lesion and inflated to 20 atmospheres for one minute. Follow-up angiography demonstrates resolution of this lesion. With the balloon inflated, retrograde injection of contrast is performed. This demonstrates an 80 to 85% stenosis near the arterial anastomosis. Therefore, the balloon is advanced into the venous system to allow for improved flow through the graft. A second Magic torque wire is advanced through the retrograde sheath and used to cross the lesion. A 5 x 4 balloon is then advanced across this arterial lesion and inflated to 14 atmospheres for one minute. Follow-up angiography is performed by pulling the venous balloon back into the graft, inflating it to  nominal, and refluxing contrast through the sheath. This demonstrates the resolution of the stricture; however, there does appear to be two areas of thrombus, one at the retrograde sheath and one at the previous angioplasty site.  Therefore, the Trerotola is used to address both of these areas. Follow-up angiography demonstrates complete resolution of both strictures, the arterial and the venous, as well as freedom from any residual thrombus within the graft.   Pursestring sutures of 4-0 Monocryl are then placed around both sheaths. The sheaths are removed, pressure is held, and there are no immediate complications.   INTERPRETATION: Initial image of the left thigh loop graft demonstrates thrombus. Following successful thrombectomy a high-grade stenosis at the venous anastomosis is noted as well as a high-grade stenosis associated with the arterial anastomosis. Venous lesion is balloon angioplastied to 8 mm. Arterial lesion is balloon angioplastied to 5 mm, both with excellent result.   SUMMARY: Successful salvage of left thigh AV graft.    ____________________________ Katha Cabal, MD ggs:bjt D: 02/18/2012 14:25:58 ET T: 02/18/2012 14:37:51 ET JOB#: [id number]  cc: Katha Cabal, MD, <Dictator> Katha Cabal MD ELECTRONICALLY SIGNED 02/20/2012 9:03

## 2015-01-14 NOTE — Op Note (Signed)
PATIENT NAME:  [name], [name] MR#:  [id number] DATE OF BIRTH:  06-18-1961  DATE OF PROCEDURE:  11/14/2011  PREOPERATIVE DIAGNOSES:  1. End-stage renal disease.  2. Clotted left thigh AV graft.  3. Hypertension.   POSTOPERATIVE DIAGNOSES: 1. End-stage renal disease.  2. Clotted left thigh AV graft.  3. Hypertension.   PROCEDURES:       1. Ultrasound guidance for vascular access in both an antegrade and retrograde fashion crossing to the left thigh AV graft.  2. Catheter-directed thrombolysis with 4 mg TPA with the AngioJet AVX catheter.  3. Mechanical rheolytic thrombectomy with the AngioJet AVX catheter.  4. Percutaneous transluminal angioplasty of venous anastomosis with 7 mm diameter angioplasty balloon.  5. Percutaneous transluminal angioplasty of arterial anastomosis for residual arterial plug causing flow limitation with a 5 mm diameter angioplasty balloon.   SURGEON: Algernon Huxley, M.D.   ANESTHESIA: Local with moderate conscious sedation.   ESTIMATED BLOOD LOSS: Approximately 25 mL.   INDICATION FOR PROCEDURE: This is a 55 year old African American male with end-stage renal disease. His left thigh graft is clotted. We are asked to try to salvage this graft. Risks and benefits were discussed. Informed consent was obtained.   DESCRIPTION OF PROCEDURE: The patient was brought to the vascular interventional radiology suite. The left thigh was sterilely prepped and draped and a sterile surgical field was created. Ultrasound was used to access the graft in antegrade and retrograde fashion crossing with micropuncture needle. Micropuncture wire and sheath were then placed. Permanent image was recorded. 6 French sheaths were placed in each location over a J-wire and a Magic torque wire was placed into the femoral artery and into the femoral vein. Imaging shows thrombosis of the graft. We then performed a catheter directed thrombolysis with 4 mg of TPA. This was allowed to dwell for 15  minutes. Following this, mechanical rheolytic thrombectomy was performed to the femoral artery throughout the graft up into the femoral and iliac veins. This uncovered a venous anastomotic stenosis in the 70 to 80% range. This was treated with a 7 mm diameter angioplasty balloon with mild residual stenosis. While the balloon was inflated, we performed imaging that refluxed the arterial anastomosis. There was an arterial plug, which was still flow limiting and we treated this with a 5 mm diameter angioplasty balloon with a good angiographic completion result after a Kumpe catheter was placed in the femoral artery. At this point, there was good pulse in the AV graft and we elected to terminate the procedure. The iliac vein and femoral vein were patent and the graft is now patent. 4-0 Monocryl pressure suture was used and both sheaths were removed. Pressure was held. Sterile dressing was placed. The patient tolerated the procedure well and was taken to the recovery room in stable condition.   ____________________________ Algernon Huxley, MD jsd:ap D: 11/14/2011 18:09:27 ET T: 11/15/2011 10:14:48 ET JOB#: [id number]  cc: Algernon Huxley, MD, <Dictator> Algernon Huxley MD ELECTRONICALLY SIGNED 11/16/2011 15:18
